# Patient Record
Sex: Male | Born: 1960
Health system: Southern US, Community
[De-identification: ages and names within clinical notes are randomized; demographics above are authoritative.]

## PROBLEM LIST (undated history)

## (undated) DIAGNOSIS — E7800 Pure hypercholesterolemia, unspecified: Secondary | ICD-10-CM

## (undated) DIAGNOSIS — E119 Type 2 diabetes mellitus without complications: Secondary | ICD-10-CM

## (undated) DIAGNOSIS — I639 Cerebral infarction, unspecified: Secondary | ICD-10-CM

## (undated) DIAGNOSIS — I509 Heart failure, unspecified: Secondary | ICD-10-CM

## (undated) DIAGNOSIS — R011 Cardiac murmur, unspecified: Secondary | ICD-10-CM

## (undated) DIAGNOSIS — R0989 Other specified symptoms and signs involving the circulatory and respiratory systems: Secondary | ICD-10-CM

## (undated) DIAGNOSIS — F1920 Other psychoactive substance dependence, uncomplicated: Secondary | ICD-10-CM

## (undated) DIAGNOSIS — Z95810 Presence of automatic (implantable) cardiac defibrillator: Secondary | ICD-10-CM

## (undated) DIAGNOSIS — J189 Pneumonia, unspecified organism: Secondary | ICD-10-CM

## (undated) DIAGNOSIS — G4730 Sleep apnea, unspecified: Secondary | ICD-10-CM

## (undated) DIAGNOSIS — I63411 Cerebral infarction due to embolism of right middle cerebral artery: Secondary | ICD-10-CM

## (undated) DIAGNOSIS — I5022 Chronic systolic (congestive) heart failure: Secondary | ICD-10-CM

## (undated) DIAGNOSIS — R931 Abnormal findings on diagnostic imaging of heart and coronary circulation: Secondary | ICD-10-CM

## (undated) DIAGNOSIS — I255 Ischemic cardiomyopathy: Secondary | ICD-10-CM

## (undated) DIAGNOSIS — Z95828 Presence of other vascular implants and grafts: Secondary | ICD-10-CM

## (undated) DIAGNOSIS — I428 Other cardiomyopathies: Secondary | ICD-10-CM

## (undated) HISTORY — PX: TONSILLECTOMY AND ADENOIDECTOMY: SUR1326

## (undated) HISTORY — PX: FRACTURE SURGERY: SHX138

## (undated) NOTE — *Deleted (*Deleted)
Physician Discharge Summary  Dennis [name]. ZOX:[id number] DOB: 08-15-1961 DOA: 07/07/2020  PCP: Harlon Ditty, MD  Admit date: 07/07/2020 Discharge date: 07/23/2020  Admitted From: *** Disposition:  ***  Recommendations for Outpatient Follow-up:  1. Follow up with PCP in 1-2 weeks 2. Please obtain BMP/CBC in one week 3. Please follow up on the following pending results:  Home Health:***  Equipment/Devices: ***  Discharge Condition: Stable Code Status:   Code Status: Full Code Diet recommendation:  Diet Order            Diet Carb Modified Fluid consistency: Thin; Room service appropriate? 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 CT head without contrast without acute finding. Neurology consulted. Could not do MRI due to ICD. Repeat CT head with contrast did not reveal CVA but concern about infiltrating enhancement in ventral pons and proximal medulla concerning of metastatic disease. EEG without epileptiform or seizure activity.  Patient with significant orthostatic hypotension even after holding cardiac meds, starting midodrine, TED hose and abdominal binder. Initially there was some vasovagal element to his orthostasis butvasovagal issues seem to have resolved. Echo withEF of 25 to 30% (previously 15 to 20%), diffuse hypokinesis G2-DD.Cardiology following.  Radiation oncologyconsulted by oncology, and hoping to doMRI brainfor better info aboutinfiltrating enhancement in midbrain. However,patient has AICDwhich is "MRI conditional" and "not FDA approved". Cardiology willing toturn off AICDif radiology is willing to do MRI brain. Radiation oncologymay  order special CT with thin cuts . Based on location MRI has been recommended operatively with meeting by his radiation oncologist Dr Timoteo Expose. Patient underwent MRI of the brain after turning pacer off- "Newly seen abnormal enhancement affecting the ventral pons from the pontomedullary junction to the mid brain. This is presumed represent metastatic disease" Patient continues to have lightheadedness and near syncopal episode mental status changes cardiology.  cardiology has further evaluated and gave bolus fluids.   Discharge Diagnoses:  Slurred speech/LUE paresthesia/unsteady gait in patient with prior right MCA CVA/acute metabolic encephalopathy on admission- CT head with and without contrast did not reveal CVA but possible pontine and medullary infiltrating lesion concerning for metastatic disease-underwent MRI of the brain after turning a pacemaker off 10/8 that shows abnormal enhancement affecting the ventral pons from the pontomedullary junction to the mid-brain-this is presumed to represent metastatic disease.  No acute stroke. Echo EF of 25 to 30% (improved). EEG no seizure activity, UDS positive for cocaine.HBA1c 10.5%. LDL 77.  Neurologically intact.Continue aspirin.  Recurrent syncope/orthostatic hypotension: Syncope due to severe orthostasis.  Multiple episodes, and micturition syncope and orthostasis.  He was symptomatic w/ bp in 70s on standing.TTE as above EF 25 to 30%.Cortisol low which is expected while on dexamethasone.TSH normal.   Seen by cardiology given bolus 500 ml ns and he will cont on midodrine TED hose and abdominal binder.    Small cell lung cancer with brain mets status post whole brain radiation:CT head with and without contrast concerning for pontine and medullary infiltrates so on Decadron 4 mg every 12 hoursper oncology .Discussed with Dr Barbaraann Cao and recommend MRI brain with and without contrast, and cardiology has turned off pacer  and had MRI 10/8- shows:"Newly seen  abnormal enhancement affecting the ventral pons from the pontomedullary junction to the mid brain. This is presumed represent metastatic disease. The possibility of treatment effect is considered but felt unlikely. Resolution of the previously seen right frontal metastasis. Old right MCA territory infarction"Spoke w/ Dr Barbaraann Cao and likely tumor, he will let radio onc know for possible fractionated radiosurgery ( likely outpatient, tumor board meeting monday).  He will arrange outpatient follow-up upon discharge.  Seen by palliative care patient would like to discuss with family  Chronic systolic CHF status post AICD [id number]: Not on guideline directed heart failure therapy due to his hypotension and syncope.  Per cardiology. CAD history occluded LAD with collaterals/HLD/HTN: No chest pain, continue aspirin and statin and Coumadin.  On midodrine due to hypotension.  History of Rt MCA CVA/cardiomyopathy on Coumadin per cardiology.  If he continues to be symptomatic w/ syncope-despite maximal therapy will need to reassess coumadin safety-have discussed cardiology will need to follow-up with cardiology.     Polysubstance abuse/Cocaine abuse: Cessation advised.He is not forthcoming about his drug use.  Uncontrolled type 2 diabetes mellitus with hyperglycemia, with long-term current use of insulin. hba1c 10.5,Cont on Lantus of 28 units twice daily, Premeal insulin 8 U -which was held 10/9 due to hypoglycemia sugar 62.  Increase Lantus to 25 units twice daily and Premeal insulin to 6 units.  Blood sugar has stabilized since.  Monitor before every meal at bedtime given that he is on a steroid. Recent Labs  Lab 07/22/20 0747 07/22/20 1142 07/22/20 1707 07/22/20 2047 07/23/20 0741  GLUCAP 160* 191* 208* 195* 166*   Lethargy/possible delirium-improved and is oriented.  Leukocytosis likely from steroid,is up today,but no fever.  Debility/deconditioning/failure to thrive: He will benefit with  palliative care consultation.  Palliative care has been consulted and met with the patient.  Home health is being set up.  Anemia of chronic disease:S hb stable Recent Labs  Lab 07/18/20 0435 07/20/20 0422 07/21/20 0550 07/22/20 0555 07/23/20 0513  HGB 10.4* 10.7* 10.4* 10.1* 10.4*  HCT 33.0* 34.7* 33.2* 31.8* 33.4*    Consults:  Cardiology, palliative care,radiation oncology,.  Subjective: ***  Discharge Exam: Vitals:   07/22/20 2051 07/23/20 0439  BP: 131/89 130/90  Pulse: 95 89  Resp: 18 18  Temp: (!) 97.5 F (36.4 C) 97.8 F (36.6 C)  SpO2: 100% 100%   General: Pt is alert, awake, not in acute distress Cardiovascular: RRR, S1/S2 +, no rubs, no gallops Respiratory: CTA bilaterally, no wheezing, no rhonchi Abdominal: Soft, NT, ND, bowel sounds + Extremities: no edema, no cyanosis  Discharge Instructions   Allergies as of 07/23/2020   No Known Allergies   Med Rec must be completed prior to using this SMARTLINK***       Follow-up Information    [facility] In 1 week.   Contact information: [address]     [address] [place] [address] [phone]             No Known Allergies  The results of significant diagnostics from this hospitalization (including imaging, microbiology, ancillary and laboratory) are listed below for reference.    Microbiology: No results found for this or any previous visit (from the past 240 hour(s)).  Procedures/Studies: CT HEAD WO CONTRAST  Result Date: 07/07/2020 CLINICAL DATA:  Slurred speech and dizziness.  History of CVA. EXAM: CT HEAD WITHOUT CONTRAST TECHNIQUE: Contiguous axial images were obtained from the base of the skull  through the vertex without intravenous contrast. COMPARISON:  June 07, 2020 FINDINGS: Brain: No evidence of acute infarction, hemorrhage, hydrocephalus, extra-axial collection or mass lesion/mass effect. Stable large area of hypoattenuation in the  subcortical right frontal lobe and insula, with associated mild ex vacuo dilatation of the right lateral ventricle, sequela of prior right MCA infarct. Vascular: No hyperdense vessel or unexpected calcification. Skull: Normal. Negative for fracture or focal lesion. Sinuses/Orbits: No acute finding. Other: None. IMPRESSION: 1. No acute intracranial abnormality. 2. Stable large area of hypoattenuation in the subcortical right frontal lobe and insula, with associated mild ex vacuo dilatation of the right lateral ventricle, sequela of prior right MCA infarct. Electronically Signed   By: Ted Mcalpine M.D.   On: 07/07/2020 15:17   CT HEAD W CONTRAST  Result Date: 07/07/2020 CLINICAL DATA:  Neuro deficit EXAM: CT HEAD WITH CONTRAST TECHNIQUE: Contiguous axial images were obtained from the base of the skull through the vertex with intravenous contrast. CONTRAST:  OMNIPAQUE IOHEXOL 300 MG/ML  SOLN COMPARISON:  07/07/2020 head CT.  06/06/2020 head CT and prior. FINDINGS: Brain: Sequela of prior right frontal insult. No acute infarct. No intracranial hemorrhage. No abnormal enhancement or mass lesion. Mild cerebral atrophy with ex vacuo dilatation. No midline shift, ventriculomegaly or extra-axial fluid collection. Vascular: Normal intravascular enhancement. Skull: No acute finding. Sinuses/Orbits: No acute finding. Mild ethmoid sinus mucosal thickening. Other: None. IMPRESSION: No acute intracranial process.  No abnormal enhancement. Sequela of prior right frontal insult, unchanged. Electronically Signed   By: Stana Bunting M.D.   On: 07/07/2020 19:43   CT HEAD W & WO CONTRAST  Result Date: 07/10/2020 CLINICAL DATA:  Small cell lung cancer. History of treated metastasis right frontal lobe. EXAM: CT HEAD WITHOUT AND WITH CONTRAST TECHNIQUE: Contiguous axial images were obtained from the base of the skull through the vertex without and with intravenous contrast CONTRAST:  75mL OMNIPAQUE IOHEXOL 300  MG/ML  SOLN COMPARISON:  CT head without with contrast 07/07/2020. CT head with contrast 12/23/2019 FINDINGS: Brain: Right frontal metastasis seen on the prior study of 12/23/2019 has resolved. No residual enhancing lesion in the right frontal lobe. There is hypodensity and volume loss in the right insula and frontal lobe compatible with chronic infarct which is unchanged. There is patchy increased density in the ventral pons and proximal medulla postcontrast which appears to be actual enhancement rather than artifact. This is not seen on the precontrast study. Of 07/07/2020 this is similar to the recent CT but not present on 12/23/2019. This area is obscured by streak artifact however is concerning for metastatic disease. No other enhancing lesions identified. Negative for intracranial hemorrhage. No hydrocephalus or midline shift. No acute infarct. Vascular: Negative for hyperdense vessel Skull: No skeletal lesion identified. Sinuses/Orbits: Paranasal sinuses clear.  Negative orbit Other: None IMPRESSION: Treated lesion in the right frontal lobe no longer present without residual enhancement. There appears to be infiltrating enhancement in the ventral pons and proximal medulla concerning for metastatic disease. This is a relatively extensive area of enhancement. Review of the recent CT 3 days ago has a similar appearance. This would be better evaluated by MRI but apparently the patient is not able to have an MRI. Patient has a pacemaker. Correlate with pacemaker manufacture and model for MRI compatibility. Chronic right frontal infarct.  No intracranial hemorrhage. These results were called by telephone at the time of interpretation on 07/10/2020 at 4:00 pm to provider [facility] , who verbally acknowledged these results. Electronically Signed  By: Marlan Palau M.D.   On: 07/10/2020 16:01   MR BRAIN W WO CONTRAST  Result Date: 07/20/2020 CLINICAL DATA:  Small cell lung cancer. Brain metastases. Reassess. EXAM:  MRI HEAD WITHOUT AND WITH CONTRAST TECHNIQUE: Multiplanar, multiecho pulse sequences of the brain and surrounding structures were obtained without and with intravenous contrast. CONTRAST:  6.71mL GADAVIST GADOBUTROL 1 MMOL/ML IV SOLN COMPARISON:  Head CT 07/10/2020. Multiple other CT studies as distant as April 2016. FINDINGS: Brain: Diffusion imaging does not show any acute or subacute infarction. There is old infarction of the inferior division right MCA on the right affecting the insula and frontal operculum. This has progressed to atrophy and encephalomalacia. Elsewhere, cerebral hemispheres do not show any ischemic insults. Old small vessel infarction left cerebellum no hydrocephalus or extra-axial collection. Resolution of the previously seen right frontal metastasis. No evidence of residual disease in that location. Newly seen abnormal enhancement affecting the ventral surface the pons the level of the pontomedullary junction to the mid brain. This is an unusual appearance and is presumed to be subsequent to metastatic disease. Some potential this could relate to treatment effect higher doses were employed in this region, but I do not know why that would have been the case. Vascular: Major vessels at the base of the brain show flow. Skull and upper cervical spine: Negative Sinuses/Orbits: Clear/normal Other: None IMPRESSION: Newly seen abnormal enhancement affecting the ventral pons from the pontomedullary junction to the mid brain. This is presumed represent metastatic disease. The possibility of treatment effect is considered but felt unlikely. Resolution of the previously seen right frontal metastasis. Old right MCA territory infarction. Electronically Signed   By: Paulina Fusi M.D.   On: 07/20/2020 13:11   DG Chest Port 1 View  Result Date: 07/07/2020 CLINICAL DATA:  Chills and weakness.  History of lung cancer. EXAM: PORTABLE CHEST 1 VIEW COMPARISON:  Chest radiograph 10/07/2019. FINDINGS: Stable  multi lead pacer apparatus overlying the left hemithorax. Stable cardiac and mediastinal contours. No large area pulmonary consolidation. No pleural effusion or pneumothorax. Small right middle lobe spiculated nodule not well demonstrated on current exam. IMPRESSION: No acute cardiopulmonary process. Electronically Signed   By: Annia Belt M.D.   On: 07/07/2020 13:54   EEG adult  Result Date: 07/09/2020 Charlsie Quest, MD     07/09/2020  3:39 PM Patient Name: Dennis [name]. MRN: [id number] Epilepsy Attending: Charlsie Quest Referring Physician/Provider: Dr. Kathlen Mody Date: 07/09/2020 Duration: 23.22 mins Patient history: 56 year old male with previous right MCA stroke, metastatic small lung cancer with mets to brain status post whole brain radiation, ongoing cocaine abuse who had an episode of transient speech disturbance.  EEG to evaluate for seizures. Level of alertness: Awake, assleep AEDs during EEG study: None Technical aspects: This EEG study was done with scalp electrodes positioned according to the 10-20 International system of electrode placement. Electrical activity was acquired at a sampling rate of 500Hz  and reviewed with a high frequency filter of 70Hz  and a low frequency filter of 1Hz . EEG data were recorded continuously and digitally stored. Description: The posterior dominant rhythm consists of 9 Hz activity of moderate voltage (25-35 uV) seen predominantly in posterior head regions, symmetric and reactive to eye opening and eye closing.  Sleep was characterized by vertex waves, sleep spindles (12 to 14 Hz), maximal frontocentral region.  Hyperventilation and photic stimulation were not performed.   IMPRESSION: This study is within normal limits. No seizures or epileptiform discharges were seen  throughout the recording. Charlsie Quest   ECHOCARDIOGRAM COMPLETE  Result Date: 07/09/2020    ECHOCARDIOGRAM REPORT   Patient Name:   Dennis [name]. Date of Exam: 07/09/2020 Medical Rec #:   [id number]       Height:       72.0 in Accession #:    [id number]      Weight:       137.0 lb Date of Birth:  11-Jun-1961        BSA:          1.814 m Patient Age:    59 years        BP:           100/65 mmHg Patient Gender: M               HR:           94 bpm. Exam Location:  Inpatient Procedure: 2D Echo Indications:    TIA G45.9  History:        Patient has prior history of Echocardiogram examinations, most                 recent 12/25/2019. CHF and Ischemic Cardiomyopathy, CAD,                 Defibrillator; Risk Factors:Diabetes, Drug and Alcohol abuse and                 Dyslipidemia.  Sonographer:    Thurman Coyer RDCS (AE) Referring Phys: Oliver Hum VIJAYA AKULA IMPRESSIONS  1. Since the last exam on 12/25/2019 LVEF has slightly improved from 15-20% to 25-30% with diffuse hypokinesis.  2. Left ventricular ejection fraction, by estimation, is 25 to 30%. The left ventricle has severely decreased function. The left ventricle demonstrates global hypokinesis. The left ventricular internal cavity size was moderately dilated. There is mild concentric left ventricular hypertrophy. Left ventricular diastolic parameters are consistent with Grade II diastolic dysfunction (pseudonormalization). Elevated left atrial pressure.  3. Right ventricular systolic function is normal. The right ventricular size is normal. There is normal pulmonary artery systolic pressure.  4. A small pericardial effusion is present. The pericardial effusion is posterior to the left ventricle.  5. The mitral valve is normal in structure. Mild mitral valve regurgitation. No evidence of mitral stenosis.  6. The aortic valve is normal in structure. Aortic valve regurgitation is trivial. No aortic stenosis is present.  7. The inferior vena cava is normal in size with greater than 50% respiratory variability, suggesting right atrial pressure of 3 mmHg. FINDINGS  Left Ventricle: Left ventricular ejection fraction, by estimation, is 25 to 30%. The left  ventricle has severely decreased function. The left ventricle demonstrates global hypokinesis. The left ventricular internal cavity size was moderately dilated. There is mild concentric left ventricular hypertrophy. Left ventricular diastolic parameters are consistent with Grade II diastolic dysfunction (pseudonormalization). Elevated left atrial pressure. Right Ventricle: The right ventricular size is normal. No increase in right ventricular wall thickness. Right ventricular systolic function is normal. There is normal pulmonary artery systolic pressure. The tricuspid regurgitant velocity is 1.77 m/s, and  with an assumed right atrial pressure of 8 mmHg, the estimated right ventricular systolic pressure is 20.5 mmHg. Left Atrium: Left atrial size was normal in size. Right Atrium: Right atrial size was normal in size. Pericardium: A small pericardial effusion is present. The pericardial effusion is posterior to the left ventricle. Mitral Valve: The mitral valve is normal in structure. Mild mitral valve regurgitation. No  evidence of mitral valve stenosis. Tricuspid Valve: The tricuspid valve is normal in structure. Tricuspid valve regurgitation is mild . No evidence of tricuspid stenosis. Aortic Valve: The aortic valve is normal in structure. Aortic valve regurgitation is trivial. No aortic stenosis is present. Pulmonic Valve: The pulmonic valve was normal in structure. Pulmonic valve regurgitation is trivial. No evidence of pulmonic stenosis. Aorta: The aortic root is normal in size and structure. Venous: The inferior vena cava is normal in size with greater than 50% respiratory variability, suggesting right atrial pressure of 3 mmHg. IAS/Shunts: No atrial level shunt detected by color flow Doppler. Additional Comments: A pacer wire is visualized in the right atrium and right ventricle.  LEFT VENTRICLE PLAX 2D LVIDd:         6.40 cm  Diastology LVIDs:         5.40 cm  LV e' medial: 9.68 cm/s LV PW:         1.20 cm  LV IVS:        1.10 cm LVOT diam:     2.20 cm LV SV:         69 LV SV Index:   38 LVOT Area:     3.80 cm  RIGHT VENTRICLE RV S prime:     12.30 cm/s TAPSE (M-mode): 2.3 cm LEFT ATRIUM             Index       RIGHT ATRIUM           Index LA diam:        3.20 cm 1.76 cm/m  RA Area:     18.00 cm LA Vol (A2C):   57.9 ml 31.92 ml/m RA Volume:   46.30 ml  25.52 ml/m LA Vol (A4C):   53.6 ml 29.55 ml/m LA Biplane Vol: 61.1 ml 33.68 ml/m  AORTIC VALVE LVOT Vmax:   111.67 cm/s LVOT Vmean:  82.300 cm/s LVOT VTI:    0.180 m  AORTA Ao Root diam: 3.70 cm TRICUSPID VALVE TR Peak grad:   12.5 mmHg TR Vmax:        177.00 cm/s  SHUNTS Systemic VTI:  0.18 m Systemic Diam: 2.20 cm Tobias Alexander MD Electronically signed by Tobias Alexander MD Signature Date/Time: 07/09/2020/12:16:56 PM    Final    VAS US CAROTID (at [facility] and [facility] only)  Result Date: 07/09/2020 Carotid Arterial Duplex Study Indications:       CVA. Risk Factors:      Hypertension, hyperlipidemia. Comparison Study:  No prior studies. Performing Technologist: Chanda Busing RVT  Examination Guidelines: A complete evaluation includes B-mode imaging, spectral Doppler, color Doppler, and power Doppler as needed of all accessible portions of each vessel. Bilateral testing is considered an integral part of a complete examination. Limited examinations for reoccurring indications may be performed as noted.  Right Carotid Findings: +----------+--------+--------+--------+-----------------------+--------+           PSV cm/sEDV cm/sStenosisPlaque Description     Comments +----------+--------+--------+--------+-----------------------+--------+ CCA Prox  110     30              smooth and heterogenous         +----------+--------+--------+--------+-----------------------+--------+ CCA Distal71      26              smooth and heterogenous         +----------+--------+--------+--------+-----------------------+--------+ ICA Prox  49      20  smooth and heterogenous         +----------+--------+--------+--------+-----------------------+--------+ ICA Distal63      27                                     tortuous +----------+--------+--------+--------+-----------------------+--------+ ECA       82      21                                              +----------+--------+--------+--------+-----------------------+--------+ +----------+--------+-------+--------+-------------------+           PSV cm/sEDV cmsDescribeArm Pressure (mmHG) +----------+--------+-------+--------+-------------------+ [id number]                                         +----------+--------+-------+--------+-------------------+ +---------+--------+--+--------+--+---------+ VertebralPSV cm/s57EDV cm/s28Antegrade +---------+--------+--+--------+--+---------+  Left Carotid Findings: +----------+--------+--------+--------+-----------------------+--------+           PSV cm/sEDV cm/sStenosisPlaque Description     Comments +----------+--------+--------+--------+-----------------------+--------+ CCA Prox  98      30              smooth and heterogenous         +----------+--------+--------+--------+-----------------------+--------+ CCA Distal56      18              smooth and heterogenous         +----------+--------+--------+--------+-----------------------+--------+ ICA Prox  48      19              smooth and heterogenous         +----------+--------+--------+--------+-----------------------+--------+ ICA Distal56      26                                     tortuous +----------+--------+--------+--------+-----------------------+--------+ ECA       40      6                                               +----------+--------+--------+--------+-----------------------+--------+ +----------+--------+--------+--------+-------------------+           PSV cm/sEDV cm/sDescribeArm Pressure (mmHG)  +----------+--------+--------+--------+-------------------+ [id number]                                          +----------+--------+--------+--------+-------------------+ +---------+--------+--+--------+--+---------+ VertebralPSV cm/s43EDV cm/s20Antegrade +---------+--------+--+--------+--+---------+   Summary: Right Carotid: Velocities in the right ICA are consistent with a 1-39% stenosis. Left Carotid: Velocities in the left ICA are consistent with a 1-39% stenosis. Vertebrals: Bilateral vertebral arteries demonstrate antegrade flow. *See table(s) above for measurements and observations.  Electronically signed by Delia Heady MD on 07/09/2020 at 12:52:15 PM.    Final      Labs: BNP (last 3 results) Recent Labs    08/05/19 0954  BNP 171.2*   Basic Metabolic Panel: Recent Labs  Lab 07/18/20 0435 07/20/20 0422 07/21/20 0550 07/22/20 0555 07/23/20 0513  NA 137 136 136 135 136  K 4.4 4.3 4.4 4.5 4.5  CL 102 100 99  100 99  CO2 27 28 28 27 29   GLUCOSE 149* 158* 158* 151* 164*  BUN 34* 40* 42* 38* 39*  CREATININE 0.96 0.97 0.94 0.91 0.91  CALCIUM 9.5 9.6 9.8 9.4 9.5   Liver Function Tests: No results for input(s): AST, ALT, ALKPHOS, BILITOT, PROT, ALBUMIN in the last 168 hours. No results for input(s): LIPASE, AMYLASE in the last 168 hours. No results for input(s): AMMONIA in the last 168 hours. CBC: Recent Labs  Lab 07/18/20 0435 07/20/20 0422 07/21/20 0550 07/22/20 0555 07/23/20 0513  WBC 15.0* 14.4* 16.8* 15.4* 15.6*  HGB 10.4* 10.7* 10.4* 10.1* 10.4*  HCT 33.0* 34.7* 33.2* 31.8* 33.4*  MCV 95.7 96.7 96.5 96.7 96.8  PLT 387 318 325 309 285   Cardiac Enzymes: No results for input(s): CKTOTAL, CKMB, CKMBINDEX, TROPONINI in the last 168 hours. BNP: Invalid input(s): POCBNP CBG: Recent Labs  Lab 07/22/20 0747 07/22/20 1142 07/22/20 1707 07/22/20 2047 07/23/20 0741  GLUCAP 160* 191* 208* 195* 166*   D-Dimer No results for input(s): DDIMER in the  last 72 hours. Hgb A1c No results for input(s): HGBA1C in the last 72 hours. Lipid Profile No results for input(s): CHOL, HDL, LDLCALC, TRIG, CHOLHDL, LDLDIRECT in the last 72 hours. Thyroid function studies No results for input(s): TSH, T4TOTAL, T3FREE, THYROIDAB in the last 72 hours.  Invalid input(s): FREET3 Anemia work up No results for input(s): VITAMINB12, FOLATE, FERRITIN, TIBC, IRON, RETICCTPCT in the last 72 hours. Urinalysis    Component Value Date/Time   COLORURINE YELLOW 07/07/2020 1628   APPEARANCEUR CLEAR 07/07/2020 1628   LABSPEC 1.014 07/07/2020 1628   PHURINE 5.0 07/07/2020 1628   GLUCOSEU 50 (A) 07/07/2020 1628   HGBUR NEGATIVE 07/07/2020 1628   BILIRUBINUR NEGATIVE 07/07/2020 1628   KETONESUR NEGATIVE 07/07/2020 1628   PROTEINUR 100 (A) 07/07/2020 1628   UROBILINOGEN 1.0 07/16/2015 1715   NITRITE NEGATIVE 07/07/2020 1628   LEUKOCYTESUR NEGATIVE 07/07/2020 1628   Sepsis Labs Invalid input(s): PROCALCITONIN,  WBC,  LACTICIDVEN Microbiology No results found for this or any previous visit (from the past 240 hour(s)).   Time coordinating discharge: *** minutes  SIGNED: Lanae Boast, MD  [facility] 07/23/2020, 8:15 AM  If 7PM-7AM, please contact night-coverage [email or web address]

---
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
## 1986-10-13 DIAGNOSIS — J189 Pneumonia, unspecified organism: Secondary | ICD-10-CM

## 1986-10-13 HISTORY — DX: Pneumonia, unspecified organism: J18.9

## 1992-10-13 HISTORY — PX: CHOLECYSTECTOMY: SHX55

## 1998-04-06 ENCOUNTER — Ambulatory Visit (HOSPITAL_COMMUNITY): Admission: RE | Admit: 1998-04-06 | Discharge: 1998-04-06 | Payer: Self-pay | Admitting: Family Medicine

## 1998-04-09 ENCOUNTER — Ambulatory Visit (HOSPITAL_COMMUNITY): Admission: RE | Admit: 1998-04-09 | Discharge: 1998-04-09 | Payer: Self-pay | Admitting: Family Medicine

## 1998-06-22 ENCOUNTER — Inpatient Hospital Stay (HOSPITAL_COMMUNITY): Admission: EM | Admit: 1998-06-22 | Discharge: 1998-06-23 | Payer: Self-pay | Admitting: Emergency Medicine

## 1998-11-10 ENCOUNTER — Emergency Department (HOSPITAL_COMMUNITY): Admission: EM | Admit: 1998-11-10 | Discharge: 1998-11-10 | Payer: Self-pay | Admitting: Emergency Medicine

## 1999-04-15 ENCOUNTER — Emergency Department (HOSPITAL_COMMUNITY): Admission: EM | Admit: 1999-04-15 | Discharge: 1999-04-15 | Payer: Self-pay | Admitting: Emergency Medicine

## 1999-08-15 ENCOUNTER — Emergency Department (HOSPITAL_COMMUNITY): Admission: EM | Admit: 1999-08-15 | Discharge: 1999-08-15 | Payer: Self-pay | Admitting: Emergency Medicine

## 1999-08-15 ENCOUNTER — Encounter: Payer: Self-pay | Admitting: Emergency Medicine

## 1999-10-22 ENCOUNTER — Emergency Department (HOSPITAL_COMMUNITY): Admission: EM | Admit: 1999-10-22 | Discharge: 1999-10-22 | Payer: Self-pay | Admitting: Emergency Medicine

## 1999-10-22 ENCOUNTER — Encounter: Payer: Self-pay | Admitting: Emergency Medicine

## 2000-10-08 ENCOUNTER — Emergency Department (HOSPITAL_COMMUNITY): Admission: EM | Admit: 2000-10-08 | Discharge: 2000-10-08 | Payer: Self-pay | Admitting: Emergency Medicine

## 2001-08-31 ENCOUNTER — Emergency Department (HOSPITAL_COMMUNITY): Admission: EM | Admit: 2001-08-31 | Discharge: 2001-08-31 | Payer: Self-pay | Admitting: Emergency Medicine

## 2001-08-31 ENCOUNTER — Encounter: Payer: Self-pay | Admitting: Emergency Medicine

## 2004-09-29 ENCOUNTER — Emergency Department (HOSPITAL_COMMUNITY): Admission: EM | Admit: 2004-09-29 | Discharge: 2004-09-30 | Payer: Self-pay | Admitting: Emergency Medicine

## 2006-09-15 ENCOUNTER — Emergency Department (HOSPITAL_COMMUNITY): Admission: EM | Admit: 2006-09-15 | Discharge: 2006-09-15 | Payer: Self-pay | Admitting: Emergency Medicine

## 2006-11-18 ENCOUNTER — Emergency Department (HOSPITAL_COMMUNITY): Admission: EM | Admit: 2006-11-18 | Discharge: 2006-11-18 | Payer: Self-pay | Admitting: Emergency Medicine

## 2007-07-02 ENCOUNTER — Emergency Department (HOSPITAL_COMMUNITY): Admission: EM | Admit: 2007-07-02 | Discharge: 2007-07-02 | Payer: Self-pay | Admitting: Emergency Medicine

## 2007-07-02 IMAGING — CR DG SACRUM/COCCYX 2+V
3 series · 3 of 3 positions shown · non-contrast
Comparison: none

CLINICAL DATA: Back pain in 46 year-old male. Pain left SI joint area.
 SACRUM/COCCYX ? 3 VIEW:

[t sacrum a.p.]
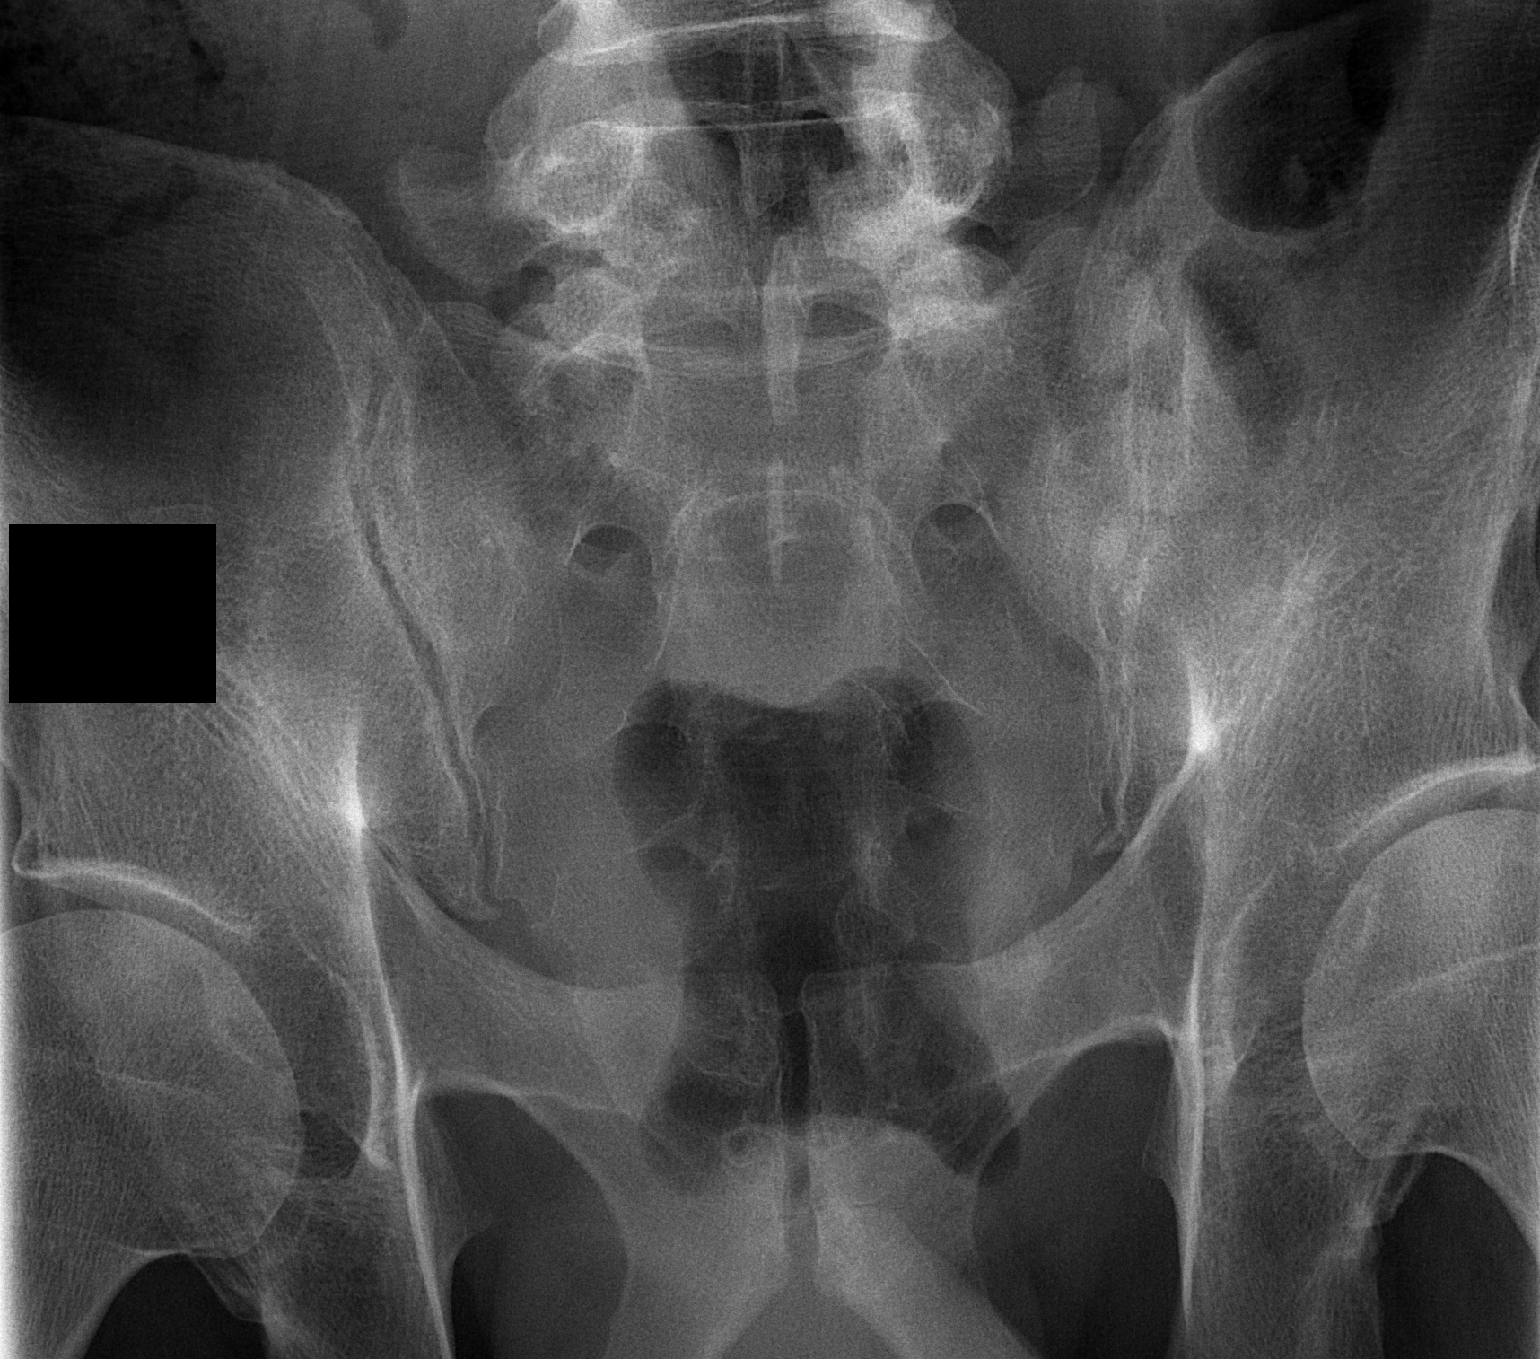

[t sacrum lat (1 of 2)]
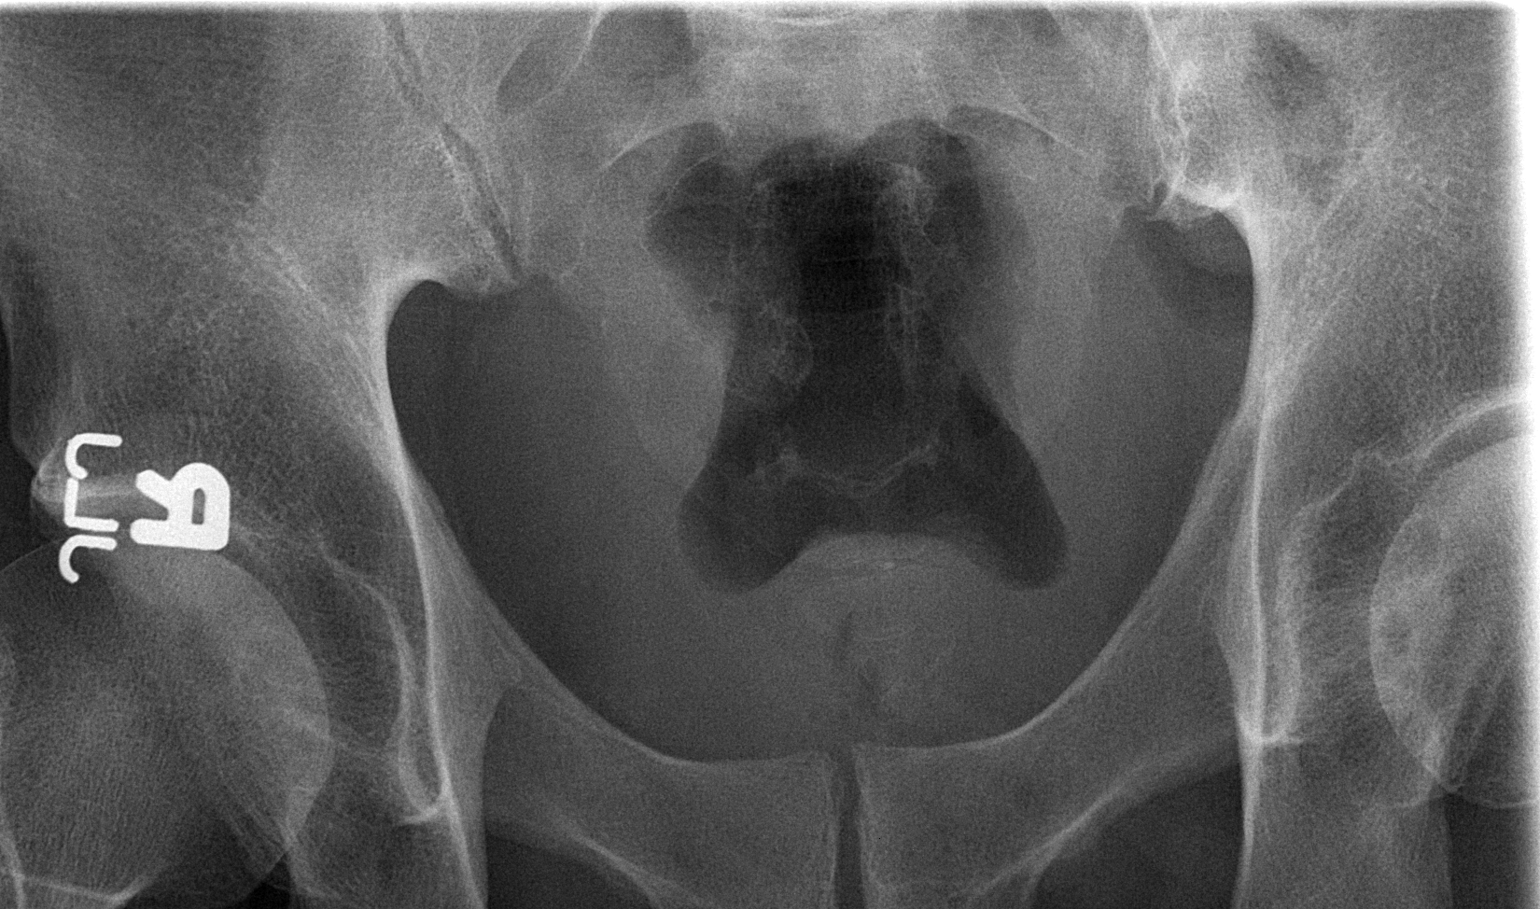

[t sacrum lat (2 of 2)]
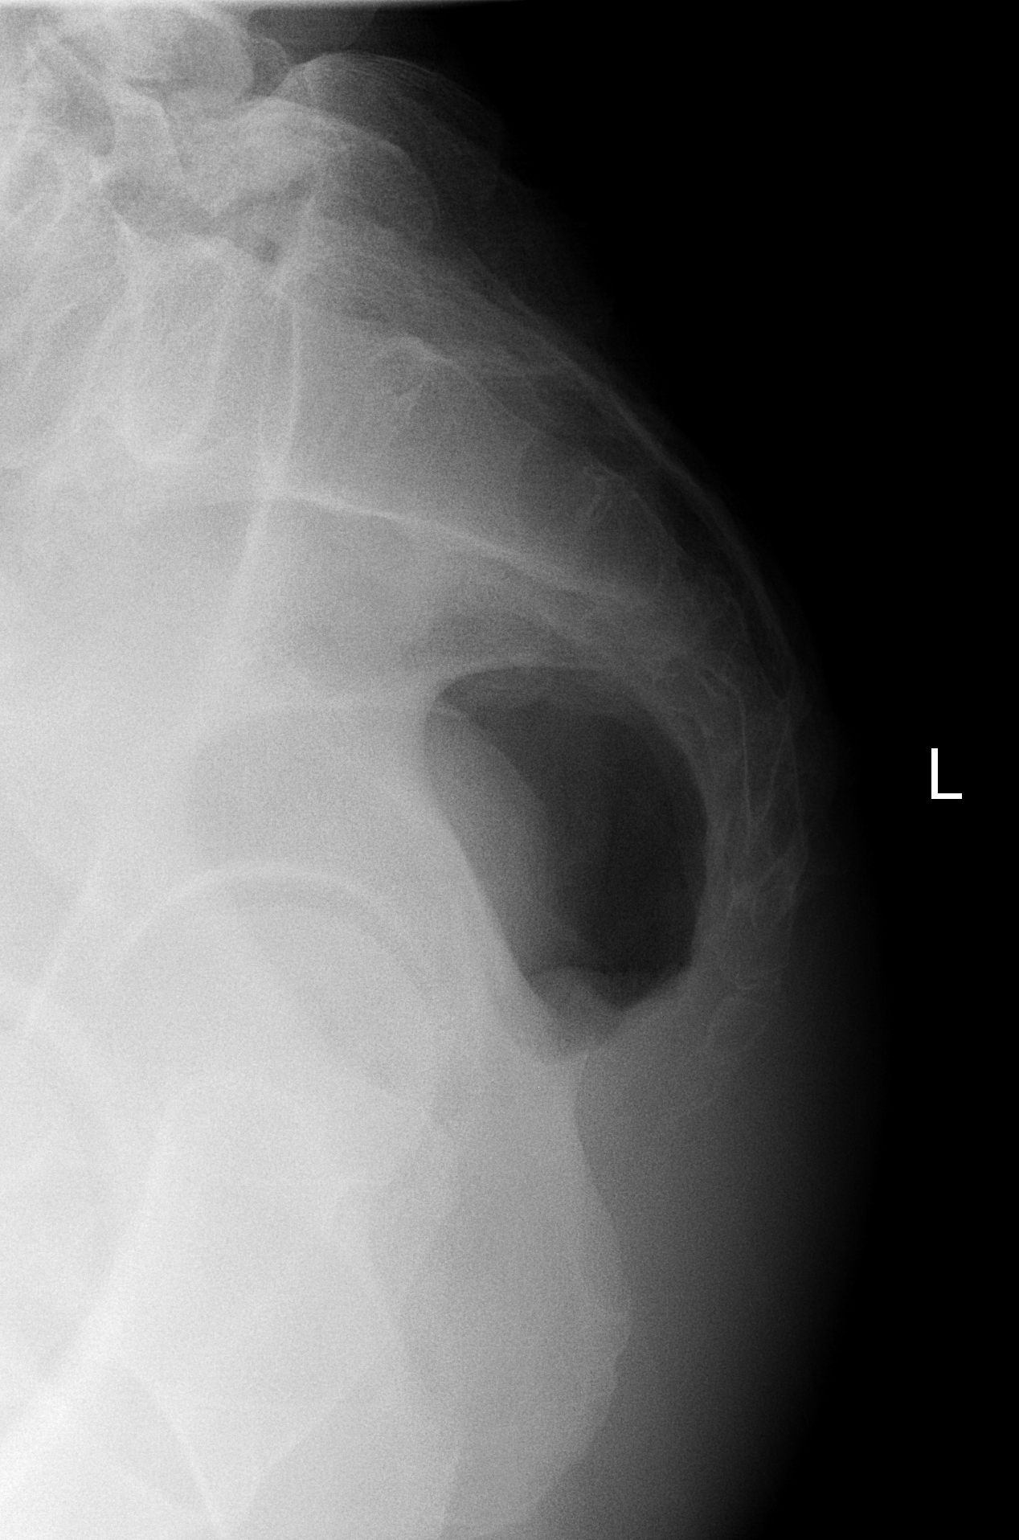

[3 of 3 positions shown; findings below may reference images not displayed]

FINDINGS: Three views of the sacrum and coccyx demonstrate no evidence of fracture or subluxation.
IMPRESSION: No evidence of fracture of the sacrum or coccyx.

## 2007-09-13 ENCOUNTER — Ambulatory Visit: Payer: Self-pay | Admitting: Internal Medicine

## 2007-09-13 DIAGNOSIS — R634 Abnormal weight loss: Secondary | ICD-10-CM | POA: Insufficient documentation

## 2007-09-13 DIAGNOSIS — E1139 Type 2 diabetes mellitus with other diabetic ophthalmic complication: Secondary | ICD-10-CM | POA: Insufficient documentation

## 2007-09-13 DIAGNOSIS — R0602 Shortness of breath: Secondary | ICD-10-CM | POA: Insufficient documentation

## 2007-09-13 IMAGING — CR DG CHEST 2V
2 series · 2 of 2 positions shown · non-contrast
Comparison: None.

CLINICAL DATA: Shortness of breath.
 CHEST - 2 VIEW:

[view not recorded (1 of 2)]
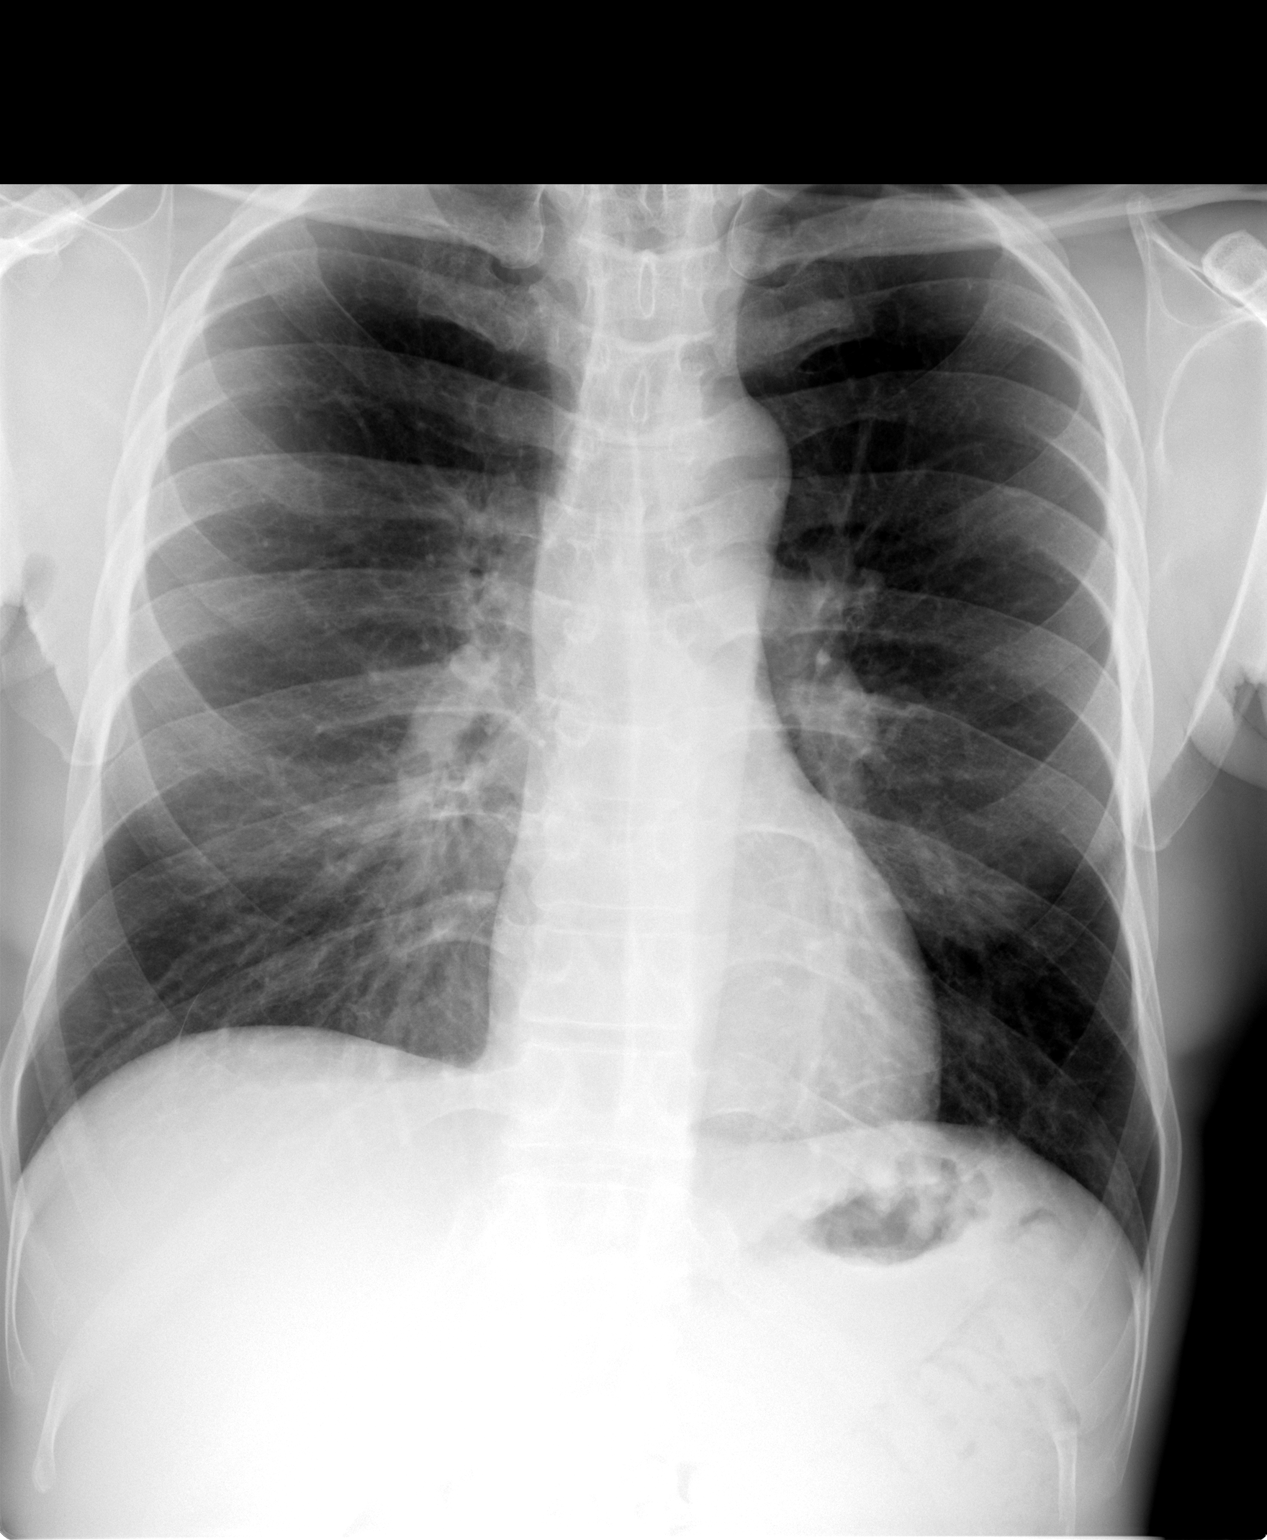

[view not recorded (2 of 2)]
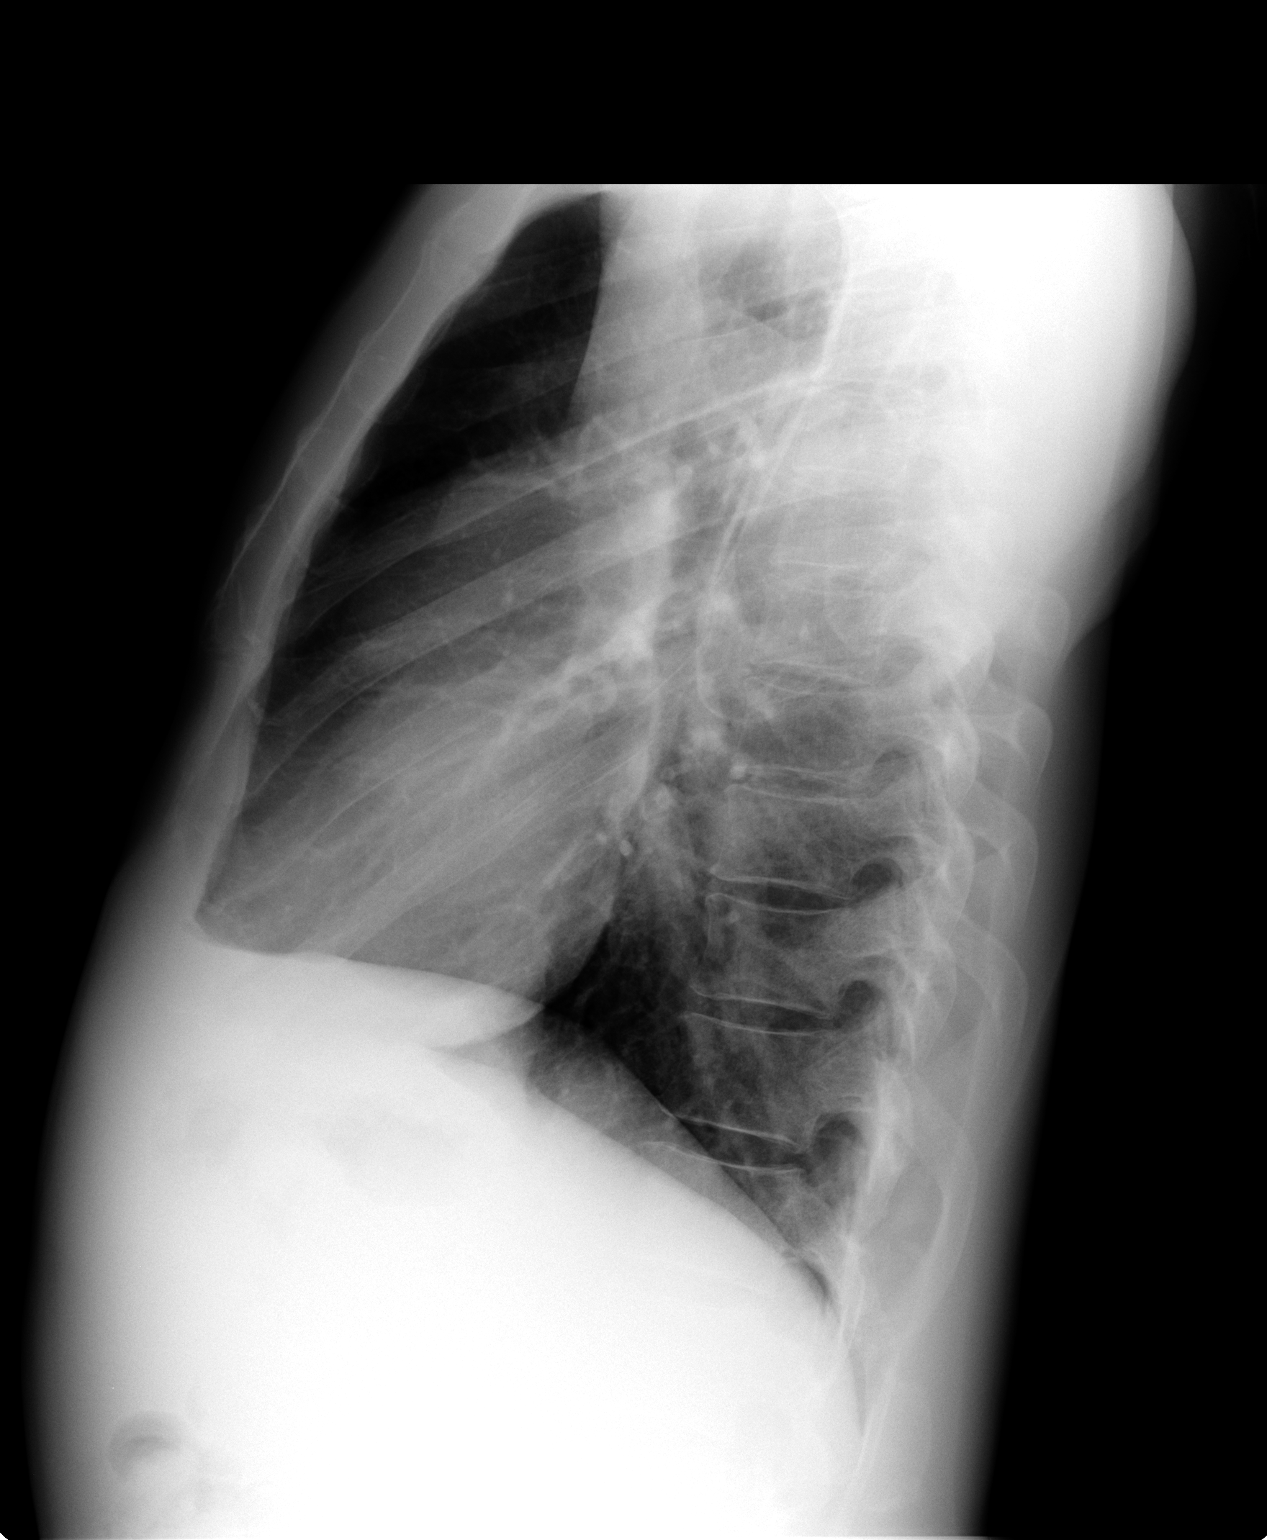

[2 of 2 positions shown; findings below may reference images not displayed]

FINDINGS: The heart size and mediastinal contours are within normal limits.  Both lungs are clear.  The visualized skeletal structures are unremarkable.
IMPRESSION: No active cardiopulmonary disease.

## 2007-10-12 ENCOUNTER — Encounter (INDEPENDENT_AMBULATORY_CARE_PROVIDER_SITE_OTHER): Payer: Self-pay | Admitting: *Deleted

## 2007-10-12 ENCOUNTER — Ambulatory Visit: Payer: Self-pay | Admitting: Internal Medicine

## 2007-10-12 LAB — CONVERTED CEMR LAB

## 2007-10-15 LAB — CONVERTED CEMR LAB
ALT: 14 units/L (ref 0–53)
AST: 13 units/L (ref 0–37)
Albumin: 3.8 g/dL (ref 3.5–5.2)
Alkaline Phosphatase: 96 units/L (ref 39–117)
BUN: 11 mg/dL (ref 6–23)
Bilirubin, Direct: 0.2 mg/dL (ref 0.0–0.3)
CO2: 31 meq/L (ref 19–32)
Calcium: 9.5 mg/dL (ref 8.4–10.5)
Chloride: 98 meq/L (ref 96–112)
Creatinine, Ser: 1.1 mg/dL (ref 0.4–1.5)
Creatinine,U: 38.9 mg/dL
Free T4: 1.2 ng/dL (ref 0.6–1.6)
GFR calc Af Amer: 93 mL/min
GFR calc non Af Amer: 77 mL/min
Glucose, Bld: 532 mg/dL (ref 70–99)
Hgb A1c MFr Bld: 9.2 % — ABNORMAL HIGH (ref 4.6–6.0)
Microalb Creat Ratio: 12.9 mg/g (ref 0.0–30.0)
Microalb, Ur: 0.5 mg/dL (ref 0.0–1.9)
Potassium: 4.4 meq/L (ref 3.5–5.1)
Sodium: 136 meq/L (ref 135–145)
T3, Free: 3.2 pg/mL (ref 2.3–4.2)
TSH: 1.11 microintl units/mL (ref 0.35–5.50)
Total Bilirubin: 0.7 mg/dL (ref 0.3–1.2)
Total Protein: 6.7 g/dL (ref 6.0–8.3)

## 2010-10-13 DIAGNOSIS — Z95810 Presence of automatic (implantable) cardiac defibrillator: Secondary | ICD-10-CM

## 2010-10-13 HISTORY — DX: Presence of automatic (implantable) cardiac defibrillator: Z95.810

## 2010-11-12 NOTE — Assessment & Plan Note (Signed)
Summary: NEW/[employer]/AWARE OF FEE/NWS   Vital Signs:  Patient Profile:   50 Years Old Male Height:     72 inches Weight:      164.13 pounds BMI:     22.34 Temp:     98.1 degrees F oral Pulse rate:   96 / minute BP sitting:   110 / 75  (right arm)  Vitals Entered By: Glendell Docker (September 13, 2007 9:36 AM)                 Chief Complaint:  New patient.  History of Present Illness: 50 year old African-American male here to establish primary care.  He has a past medical history of type 2 diabetes diagnosed in 2005.  He reports hospitalization for blood sugars 400 to 500s.  he was placed on metformin and glipizide.  He has not had regular follow-up.  He reports he does not check his sugar on a regularl t basis.  It has been over a year since his last funduscopic eye exam by ophthalmologist.  His main complaint today has been progressive weight loss.  He weighed 232 pounds in July of 2007.  He is at his current weight of 164.2 pounds.  He denies anorexia.  He eats 3 meals a day.  He denies history of palpitations or anxiety.  He has been monogamous for the last 4 years but does report having many sexual partners in the remote past.  He reports recent HIV testing that was negative.  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  The patient denies vision loss, chest pain, abdominal pain, hematochezia, and severe indigestion/heartburn.     Physical Exam  General:     alert, appropriate dress, normal appearance, and underweight appearing.   Head:     Normocephalic and atraumatic without obvious abnormalities.  Eyes:     No corneal or conjunctival inflammation noted. EOMI. Perrla. Funduscopic exam benign, without hemorrhages, exudates or papilledema. Vision grossly normal. Ears:     External ear exam shows no significant lesions or deformities.  Otoscopic examination reveals clear canals, tympanic membranes are intact bilaterally without bulging, retraction, inflammation or discharge. Hearing is grossly normal bilaterally. Nose:     External nasal examination shows no deformity or inflammation. Nasal mucosa are pink and moist without lesions or exudates. Mouth:     Oral mucosa and oropharynx without lesions or exudates.   Neck:     No deformities, masses, or tenderness noted.no carotid bruits.   Lungs:     Normal respiratory effort, chest expands symmetrically.  Lungs are clear to auscultation, no crackles or wheezes. Heart:     Normal rate and regular rhythm. S1 and S2 normal without gallop, murmur, click, rub or other extra sounds. Abdomen:     Bowel sounds positive,abdomen soft and non-tender without masses, organomegaly or hernias noted. Pulses:     femoral,dorsalis pedis and posterior tibial pulses are full and equal bilaterally Extremities:     No lower extremity edema  Neurologic:     No cranial nerve deficits noted. Station and gait are normal. Sensory, motor and coordinative functions appear intact. Psych:     Cognition and judgment appear intact. Alert and cooperative with normal attention span and concentration.    Impression & Recommendations:  Problem # 1:  WEIGHT LOSS, ABNORMAL  (ICD-783.21) Patient is approximately 70 pounds within the last 18 months.  Patient has changed his diet when he learned he was diabetic Some of this weight loss may be unintentional. Check thyroid studies. Patient reports negative HIV recently.  Problem # 2:  SOB (ICD-786.05) Patient likely has some component of chronic obstructive lung disease considering 20-pack-year history..  Check chest x-ray.  May need 2-D echocardiogram. Orders: T-2 View CXR (71020TC)   Problem # 3:  TOBACCO ABUSE (ICD-305.1) Encouraged smoking cessation and discussed different methods for smoking cessation.   Problem # 4:  DIABETES MELLITUS, TYPE II (ICD-250.00) Obtain A1c and micro-albumin/cr ratio ratio.  We discussed need for ophthalmology referral.  His updated medication list for this problem includes:    Metformin Hcl 500 Mg Tabs (Metformin hcl) .Marland Kitchen... Take 1 tablet by mouth once a day    Glipizide 10 Mg Tabs (Glipizide) .Marland Kitchen... Take 1 tablet by mouth once a day   Complete Medication List: 1)  Metformin Hcl 500 Mg Tabs (Metformin hcl) .... Take 1 tablet by mouth once a day 2)  Glipizide 10 Mg Tabs (Glipizide) .... Take 1 tablet by mouth once a day  Other Orders: Pneumococcal Vaccine (75643) Influenza Vaccine NON MCR (32951) Admin 1st Vaccine (88416)   Patient Instructions: 1)  Please schedule a follow-up appointment in 2 weeks. 2)  BMP prior to visit, ICD-9:  250.00 3)  Hepatic Panel prior to visit, ICD-9:  250.00 4)  Lipid Panel prior to visit, ICD-9: 250.00 5)  TSH prior to visit, ICD-9: 783.21 6)  Free T4 and Free T3:  783.21 7)  CBC w/ Diff prior to visit, ICD-9:  783.21 8)  Urine-dip prior to visit, ICD-9: 250.00 9)  PSA prior to visit, ICD-9: V76.44 10)  HbgA1C prior to visit, ICD-9: 250.00 11)  Urine Microalbumin prior to visit, ICD-9: 250.00    ]  Pneumovax Vaccine    Vaccine Type: Pneumovax    Site: right deltoid    Mfr: Merck    Dose: 0.5 ml    Route: IM    Given by:  Glendell Docker    Exp. Date: 03/05/2009    Lot #: [id number]    VIS given: 05/10/96 version given September 13, 2007.  Influenza Vaccine    Vaccine Type: Fluvax Non-MCR    Site: left deltoid    Mfr: Sanofi Pasteur    Dose: 0.5 ml    Route: IM    Given by: Glendell Docker    Exp. Date: 04/11/2008    Lot #: [id number]    VIS given: 05/06/07 version given September 13, 2007.  Flu Vaccine Consent Questions    Do you have a history of severe allergic reactions to this vaccine?  no    Any prior history of allergic reactions to egg and/or gelatin? no    Do you have a sensitivity to the preservative Thimersol? no    Do you have a past history of Guillan-Barre Syndrome? no    Do you currently have an acute febrile illness? no    Have you ever had a severe reaction to latex? no    Vaccine information given and explained to patient? yes

## 2010-11-12 NOTE — Letter (Signed)
Summary: [facility] Consult Scheduled Letter  [facility]  [address] [place], [place] [address]   Phone: [phone]  Fax: [phone]    10/12/2007 MRN: [id number]    Dear Dennis [name],      We have scheduled an appointment for you.  At the recommendation of Dr. Artist Pais, we have scheduled you a consult with Dr. Ashley Royalty on October 25, 2007 at 1:40pm.  Their phone number is [phone].  If this appointment day and time is not convenient for you, please feel free to call the office of the doctor you are being referred to at the number listed above and reschedule the appointment.  Dr. Ashley Royalty [facility] [address] [place], [place] [address]  Thank you,  Patient Care Coordinator [facility]

## 2010-11-12 NOTE — Assessment & Plan Note (Signed)
Summary: 2 WK ROA/NML   Vital Signs:  Patient Profile:   50 Years Old Male Height:     72 inches Weight:      164.13 pounds BMI:     22.34 Temp:     98.2 degrees F oral Pulse rate:   93 / minute BP sitting:   127 / 78  (right arm)  Vitals Entered By: Glendell Docker (October 12, 2007 10:35 AM)                 Chief Complaint:  Diabetes Management and Type 2 diabetes mellitus follow-up.  History of Present Illness:  Type 2 Diabetes Mellitus Follow-Up      This is a 50 year old man who presents for Type 2 diabetes mellitus follow-up.  The patient reports weight loss, but denies self managed hypoglycemia.  The patient denies the following symptoms: chest pain.  Since the last visit the patient reports poor dietary compliance.  The patient has been measuring capillary blood glucose before breakfast.  Since the last visit, the patient reports having had no eye care.    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   Neck:     No deformities, masses, or tenderness noted. Lungs:     Normal respiratory effort, chest expands symmetrically. Lungs are clear to auscultation, no crackles or wheezes. Heart:     Normal rate and regular rhythm. S1 and S2 normal without gallop,  murmur, click, rub or other extra sounds. Abdomen:     soft and non-tender.   Extremities:     No lower extremity edema  Skin:     1 cm firm hyperpigmented nodule of left lower thigh, slightly tender to palpation Psych:     Cognition and judgment appear intact. Alert and cooperative with normal attention span and concentration. No apparent delusions, illusions, hallucinations    Impression & Recommendations:  Problem # 1:  DIABETES MELLITUS, TYPE II (ICD-250.00) His AM CBG is 300-400.  Poor dietary compliance and insight. Refer to diabetic educator.  He will likely need insulin therapy.   He is resistant to starting insulin.  His updated medication list for this problem includes:    Metformin Hcl 500 Mg Tabs (Metformin hcl) .Marland Kitchen... Take 1 tablet by mouth once a day    Glipizide 10 Mg Tabs (Glipizide) .Marland Kitchen... Take 1 tablet by mouth once a day  Orders: TLB-A1C / Hgb A1C (Glycohemoglobin) (83036-A1C) TLB-BMP (Basic Metabolic  Panel-BMET) (80048-METABOL) TLB-Hepatic/Liver Function Pnl (80076-HEPATIC) TLB-Microalbumin/Creat Ratio, Urine (82043-MALB) Diabetic Clinic Referral (Diabetic) Ophthalmology Referral (Ophthalmology)   Problem # 2:  WEIGHT LOSS, ABNORMAL (ICD-783.21) Patient never went for previously ordered blood work.  Orders: TLB-TSH (Thyroid Stimulating Hormone) (84443-TSH) TLB-T4 (Thyrox), Free [phone]) TLB-T3, Free (Triiodothyronine) (84481-T3FREE) T-RPR (Syphilis) [phone]) T- * Misc. Laboratory test [phone])   Complete Medication List: 1)  Metformin Hcl 500 Mg Tabs (Metformin hcl) .... Take 1 tablet by mouth once a day 2)  Glipizide 10 Mg Tabs (Glipizide) .... Take 1 tablet by mouth once a day   Patient Instructions: 1)  Please schedule a follow-up appointment in 1 month. 2)  Lipid Panel prior to visit, ICD-9:  272.4 3)  PSA prior to visit, ICD-9:  V76.44 4)  Please return for lab work one (1) week before your next appointment.     ]

## 2011-03-14 HISTORY — PX: CARDIAC DEFIBRILLATOR PLACEMENT: SHX171

## 2011-06-18 ENCOUNTER — Emergency Department (HOSPITAL_COMMUNITY): Payer: Medicare Other

## 2011-06-18 ENCOUNTER — Emergency Department (HOSPITAL_COMMUNITY)
Admission: EM | Admit: 2011-06-18 | Discharge: 2011-06-18 | Disposition: A | Discharge status: Home / Self Care | Payer: Medicare Other | Attending: Emergency Medicine | Admitting: Emergency Medicine

## 2011-06-18 DIAGNOSIS — J3489 Other specified disorders of nose and nasal sinuses: Secondary | ICD-10-CM | POA: Insufficient documentation

## 2011-06-18 DIAGNOSIS — I10 Essential (primary) hypertension: Secondary | ICD-10-CM | POA: Insufficient documentation

## 2011-06-18 DIAGNOSIS — R05 Cough: Secondary | ICD-10-CM | POA: Insufficient documentation

## 2011-06-18 DIAGNOSIS — R6883 Chills (without fever): Secondary | ICD-10-CM | POA: Insufficient documentation

## 2011-06-18 DIAGNOSIS — Z79899 Other long term (current) drug therapy: Secondary | ICD-10-CM | POA: Insufficient documentation

## 2011-06-18 DIAGNOSIS — R059 Cough, unspecified: Secondary | ICD-10-CM | POA: Insufficient documentation

## 2011-06-18 DIAGNOSIS — I2510 Atherosclerotic heart disease of native coronary artery without angina pectoris: Secondary | ICD-10-CM | POA: Insufficient documentation

## 2011-06-18 DIAGNOSIS — Z95810 Presence of automatic (implantable) cardiac defibrillator: Secondary | ICD-10-CM | POA: Insufficient documentation

## 2011-06-18 DIAGNOSIS — R079 Chest pain, unspecified: Secondary | ICD-10-CM | POA: Insufficient documentation

## 2011-06-18 DIAGNOSIS — E119 Type 2 diabetes mellitus without complications: Secondary | ICD-10-CM | POA: Insufficient documentation

## 2011-06-18 DIAGNOSIS — J069 Acute upper respiratory infection, unspecified: Secondary | ICD-10-CM | POA: Insufficient documentation

## 2011-06-18 DIAGNOSIS — Z7982 Long term (current) use of aspirin: Secondary | ICD-10-CM | POA: Insufficient documentation

## 2011-06-18 DIAGNOSIS — Z7901 Long term (current) use of anticoagulants: Secondary | ICD-10-CM | POA: Insufficient documentation

## 2011-06-18 LAB — COMPREHENSIVE METABOLIC PANEL
ALT: 21 U/L (ref 0–53)
AST: 21 U/L (ref 0–37)
Albumin: 3.7 g/dL (ref 3.5–5.2)
Alkaline Phosphatase: 81 U/L (ref 39–117)
BUN: 9 mg/dL (ref 6–23)
CO2: 26 mEq/L (ref 19–32)
Calcium: 9.5 mg/dL (ref 8.4–10.5)
Chloride: 105 mEq/L (ref 96–112)
Creatinine, Ser: 0.9 mg/dL (ref 0.50–1.35)
GFR calc Af Amer: >60 mL/min (ref >60)
GFR calc non Af Amer: >60 mL/min (ref >60)
Glucose, Bld: 248 mg/dL — ABNORMAL HIGH (ref 70–99)
Potassium: 3.8 mEq/L (ref 3.5–5.1)
Sodium: 139 mEq/L (ref 135–145)
Total Bilirubin: 0.6 mg/dL (ref 0.3–1.2)
Total Protein: 6.8 g/dL (ref 6.0–8.3)

## 2011-06-18 LAB — PROTIME-INR
INR: 2.07 — ABNORMAL HIGH (ref 0.00–1.49)
Prothrombin Time: 23.7 seconds — ABNORMAL HIGH (ref 11.6–15.2)

## 2011-06-18 LAB — POCT I-STAT TROPONIN I: Troponin i, poc: 0 ng/mL (ref 0.00–0.08)

## 2011-06-18 LAB — CBC
HCT: 40.1 % (ref 39.0–52.0)
Hemoglobin: 13.2 g/dL (ref 13.0–17.0)
MCH: 30.7 pg (ref 26.0–34.0)
MCHC: 32.9 g/dL (ref 30.0–36.0)
MCV: 93.3 fL (ref 78.0–100.0)
Platelets: 215 10*3/uL (ref 150–400)
RBC: 4.3 MIL/uL (ref 4.22–5.81)
RDW: 13.1 % (ref 11.5–15.5)
WBC: 13.6 10*3/uL — ABNORMAL HIGH (ref 4.0–10.5)

## 2011-06-18 IMAGING — CR DG CHEST 2V
2 series · 2 of 2 positions shown · non-contrast
Comparison: [DATE]

CLINICAL DATA: Left-sided chest pain.  Cough.

CHEST - 2 VIEW

[w chest pa]
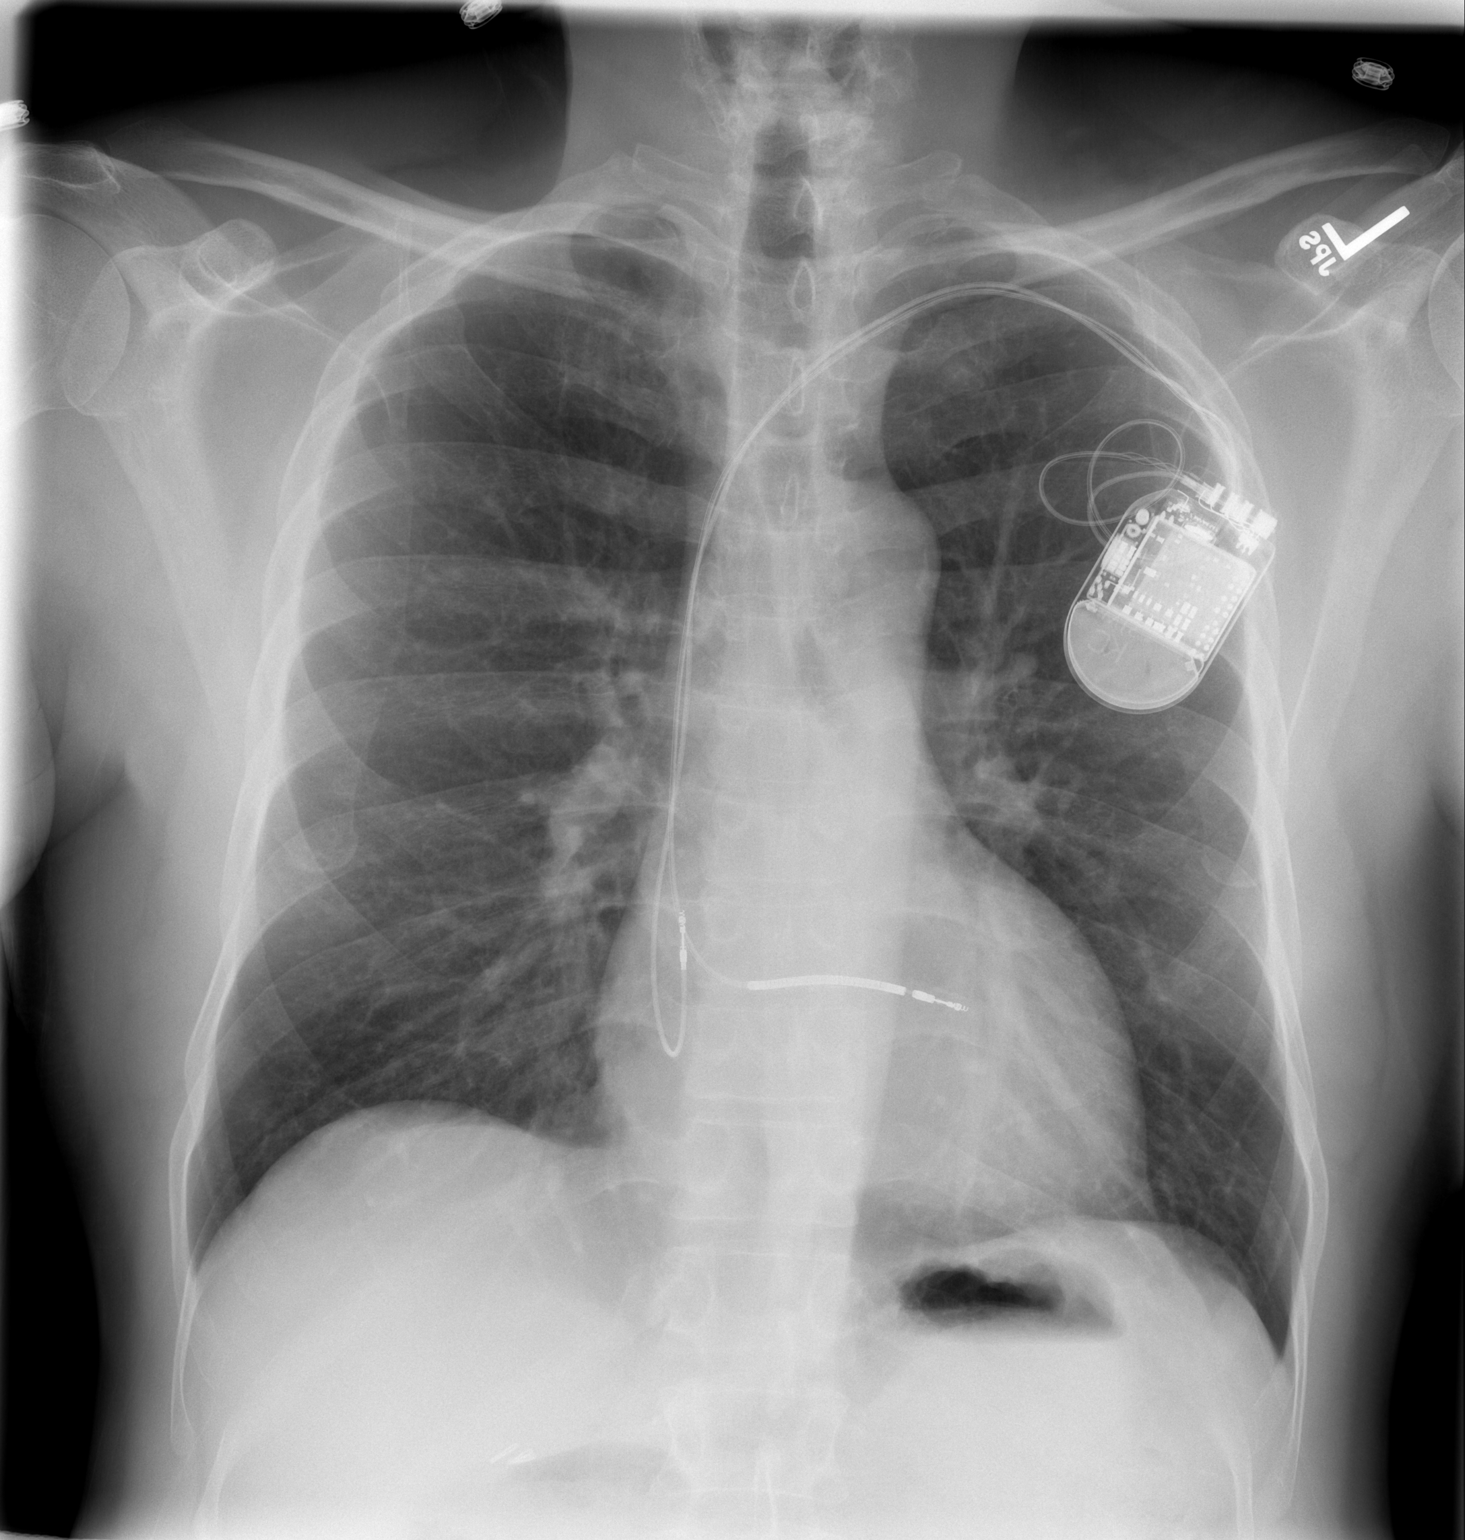

[w chest lat]
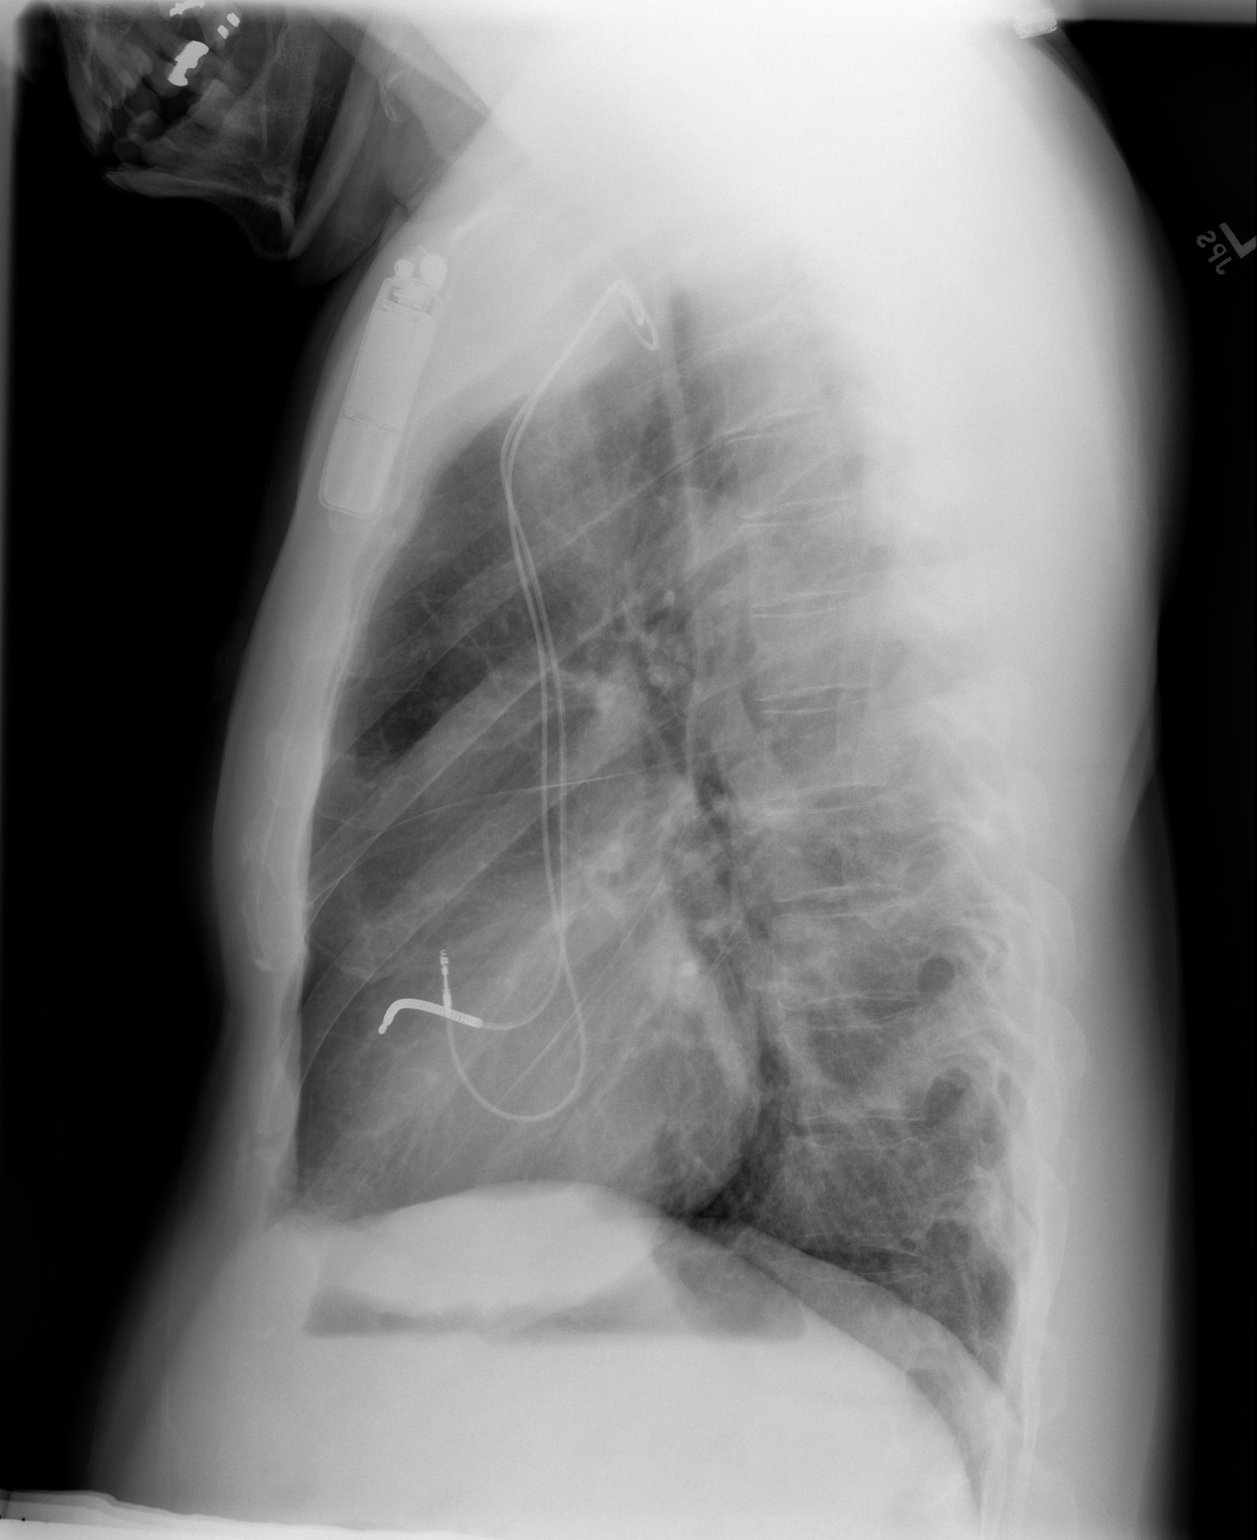

[2 of 2 positions shown; findings below may reference images not displayed]

FINDINGS: AICD in place.  Heart size and vascularity are normal and
the lungs are clear.  No osseous abnormality.
IMPRESSION: No acute abnormalities.

## 2011-06-24 ENCOUNTER — Emergency Department (HOSPITAL_COMMUNITY): Payer: Medicare Other

## 2011-06-24 ENCOUNTER — Inpatient Hospital Stay (HOSPITAL_COMMUNITY)
Admission: EM | Admit: 2011-06-24 | Discharge: 2011-06-28 | DRG: 194 | Disposition: A | Discharge status: Home / Self Care | Payer: Medicare Other | Attending: Internal Medicine | Admitting: Internal Medicine

## 2011-06-24 DIAGNOSIS — Z7982 Long term (current) use of aspirin: Secondary | ICD-10-CM

## 2011-06-24 DIAGNOSIS — R0789 Other chest pain: Secondary | ICD-10-CM | POA: Diagnosis present

## 2011-06-24 DIAGNOSIS — I10 Essential (primary) hypertension: Secondary | ICD-10-CM | POA: Diagnosis present

## 2011-06-24 DIAGNOSIS — Z794 Long term (current) use of insulin: Secondary | ICD-10-CM

## 2011-06-24 DIAGNOSIS — Z7901 Long term (current) use of anticoagulants: Secondary | ICD-10-CM

## 2011-06-24 DIAGNOSIS — N62 Hypertrophy of breast: Secondary | ICD-10-CM | POA: Diagnosis present

## 2011-06-24 DIAGNOSIS — I509 Heart failure, unspecified: Secondary | ICD-10-CM | POA: Diagnosis present

## 2011-06-24 DIAGNOSIS — Z95810 Presence of automatic (implantable) cardiac defibrillator: Secondary | ICD-10-CM

## 2011-06-24 DIAGNOSIS — I871 Compression of vein: Secondary | ICD-10-CM | POA: Diagnosis present

## 2011-06-24 DIAGNOSIS — E119 Type 2 diabetes mellitus without complications: Secondary | ICD-10-CM | POA: Diagnosis present

## 2011-06-24 DIAGNOSIS — J189 Pneumonia, unspecified organism: Principal | ICD-10-CM | POA: Diagnosis present

## 2011-06-24 DIAGNOSIS — D3500 Benign neoplasm of unspecified adrenal gland: Secondary | ICD-10-CM | POA: Diagnosis present

## 2011-06-24 DIAGNOSIS — I428 Other cardiomyopathies: Secondary | ICD-10-CM | POA: Diagnosis present

## 2011-06-24 DIAGNOSIS — F17200 Nicotine dependence, unspecified, uncomplicated: Secondary | ICD-10-CM | POA: Diagnosis present

## 2011-06-24 LAB — CBC
HCT: 44 % (ref 39.0–52.0)
Hemoglobin: 14.4 g/dL (ref 13.0–17.0)
MCH: 30.6 pg (ref 26.0–34.0)
MCHC: 32.7 g/dL (ref 30.0–36.0)
MCV: 93.4 fL (ref 78.0–100.0)
Platelets: 212 10*3/uL (ref 150–400)
RBC: 4.71 MIL/uL (ref 4.22–5.81)
RDW: 12.9 % (ref 11.5–15.5)
WBC: 14.8 10*3/uL — ABNORMAL HIGH (ref 4.0–10.5)

## 2011-06-24 LAB — CK TOTAL AND CKMB (NOT AT ARMC)
CK, MB: 2.1 ng/mL (ref 0.3–4.0)
Relative Index: 0.9 (ref 0.0–2.5)
Total CK: 239 U/L — ABNORMAL HIGH (ref 7–232)

## 2011-06-24 LAB — BASIC METABOLIC PANEL
BUN: 17 mg/dL (ref 6–23)
CO2: 29 mEq/L (ref 19–32)
Calcium: 9.6 mg/dL (ref 8.4–10.5)
Chloride: 99 mEq/L (ref 96–112)
Creatinine, Ser: 1.1 mg/dL (ref 0.50–1.35)
GFR calc Af Amer: >60 mL/min (ref >60)
GFR calc non Af Amer: >60 mL/min (ref >60)
Glucose, Bld: 277 mg/dL — ABNORMAL HIGH (ref 70–99)
Potassium: 4.1 mEq/L (ref 3.5–5.1)
Sodium: 137 mEq/L (ref 135–145)

## 2011-06-24 LAB — DIFFERENTIAL
Basophils Absolute: 0 10*3/uL (ref 0.0–0.1)
Basophils Relative: 0 % (ref 0–1)
Eosinophils Absolute: 0.7 10*3/uL (ref 0.0–0.7)
Eosinophils Relative: 5 % (ref 0–5)
Lymphocytes Relative: 26 % (ref 12–46)
Lymphs Abs: 3.8 10*3/uL (ref 0.7–4.0)
Monocytes Absolute: 1.6 10*3/uL — ABNORMAL HIGH (ref 0.1–1.0)
Monocytes Relative: 11 % (ref 3–12)
Neutro Abs: 8.7 10*3/uL — ABNORMAL HIGH (ref 1.7–7.7)
Neutrophils Relative %: 58 % (ref 43–77)

## 2011-06-24 LAB — PRO B NATRIURETIC PEPTIDE: Pro B Natriuretic peptide (BNP): 187.9 pg/mL — ABNORMAL HIGH (ref 0–125)

## 2011-06-24 LAB — TROPONIN I: Troponin I: <0.3 ng/mL (ref <0.30)

## 2011-06-24 LAB — POCT I-STAT TROPONIN I: Troponin i, poc: 0.08 ng/mL (ref 0.00–0.08)

## 2011-06-24 LAB — PROTIME-INR
INR: 2.64 — ABNORMAL HIGH (ref 0.00–1.49)
Prothrombin Time: 28.6 seconds — ABNORMAL HIGH (ref 11.6–15.2)

## 2011-06-24 IMAGING — CT CT ANGIO CHEST
1 of 2 series · 19 of 32 positions shown · IV contrast (APPLIED)
Comparison: [DATE] radiograph

CLINICAL DATA: Chest pain

CT ANGIOGRAPHY CHEST WITH CONTRAST
TECHNIQUE: Multidetector CT imaging of the chest was performed
using the standard protocol during bolus administration of
intravenous contrast.  Multiplanar CT image reconstructions
including MIPs were obtained to evaluate the vascular anatomy.
Contrast:  100 ml [NC]

[Series 7: thins for pacs · axial · 0.74mm/px · z∈[-242,+26]mm · 19 of 299 slices shown]
[im 15/299  lung]
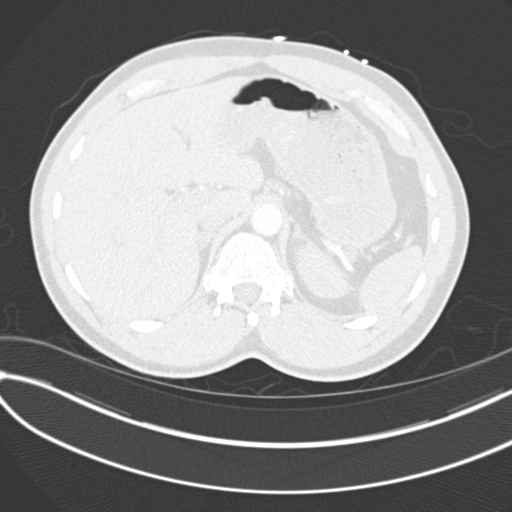
[im 30/299  mediastinal]
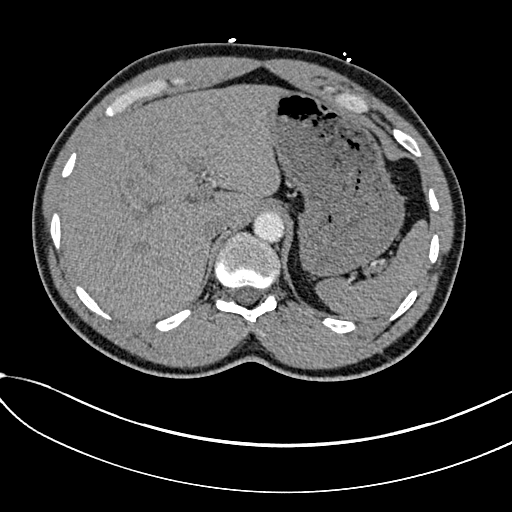
[im 45/299  lung]
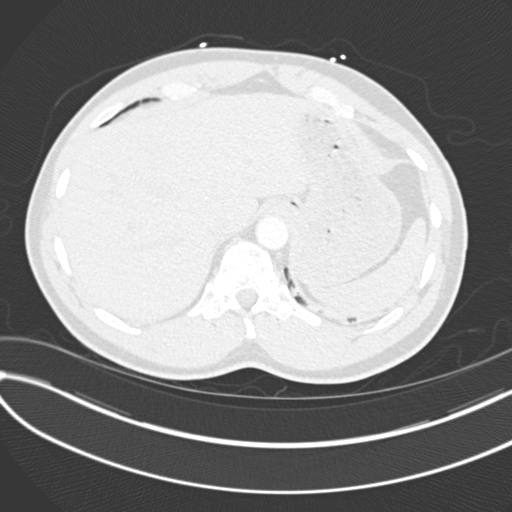
[im 75/299  mediastinal]
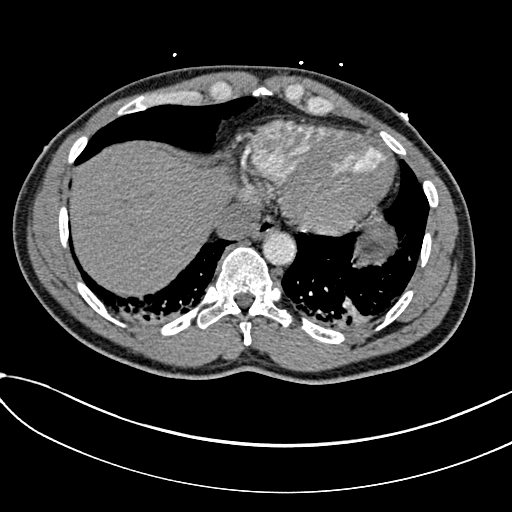
[im 90/299  lung]
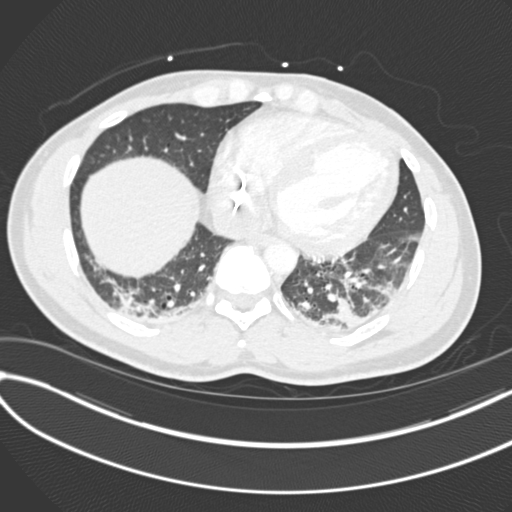
[im 100/299  mediastinal]
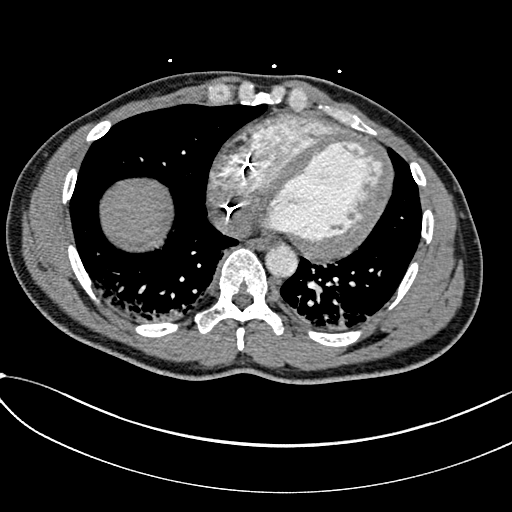
[im 105/299  lung]
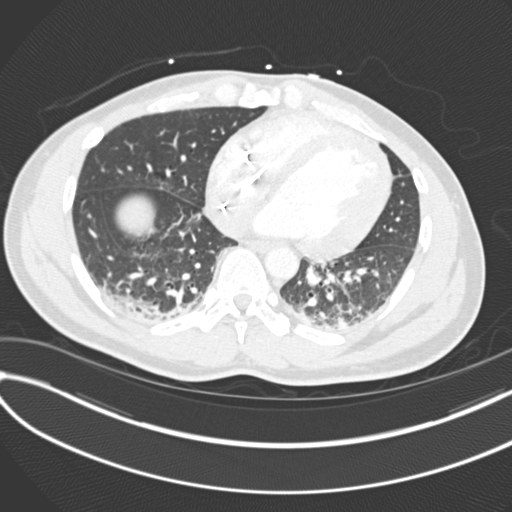
[im 120/299  mediastinal]
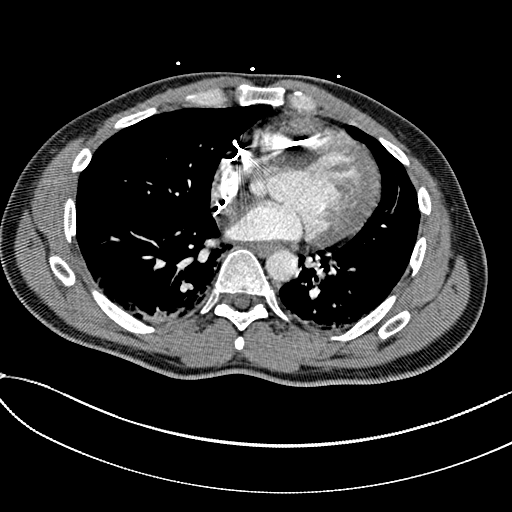
[im 135/299  lung]
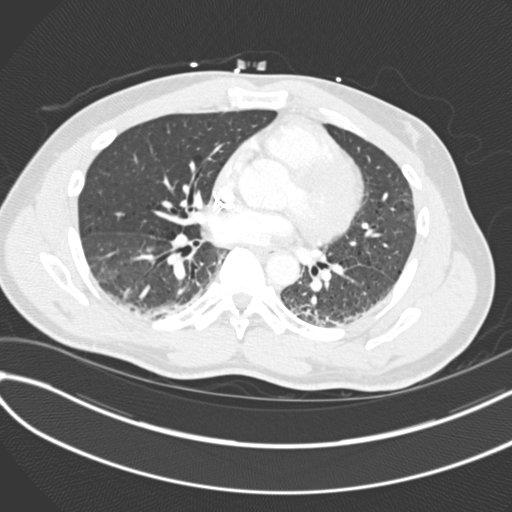
[im 150/299  mediastinal]
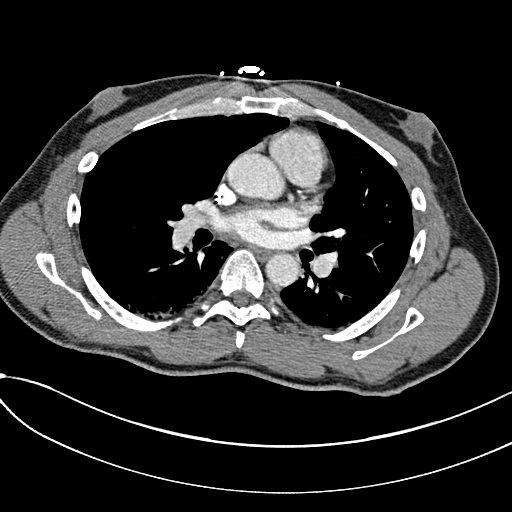
[im 164/299  lung]
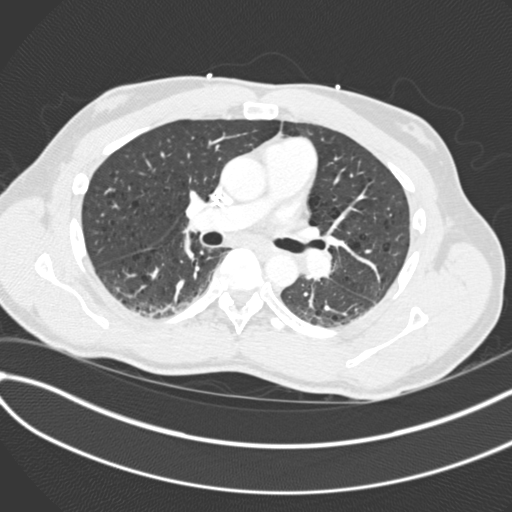
[im 179/299  mediastinal]
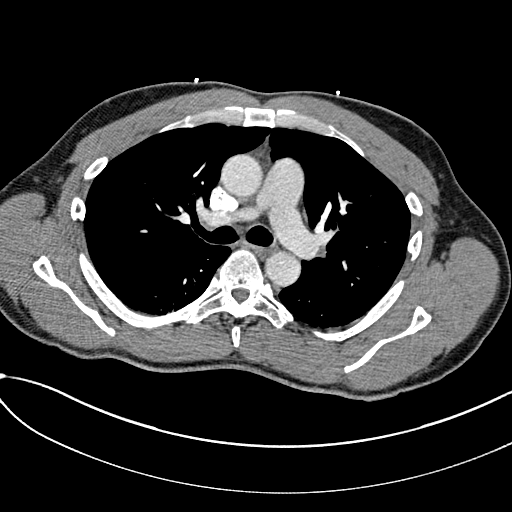
[im 194/299  lung]
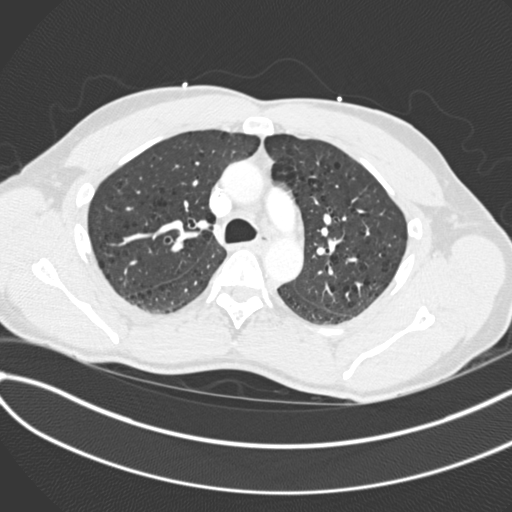
[im 199/299  mediastinal]
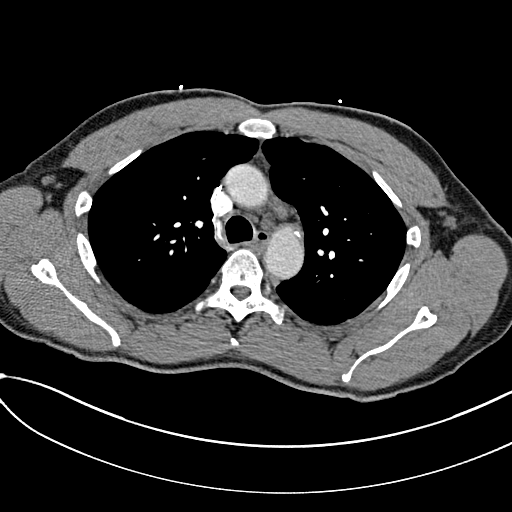
[im 209/299  lung]
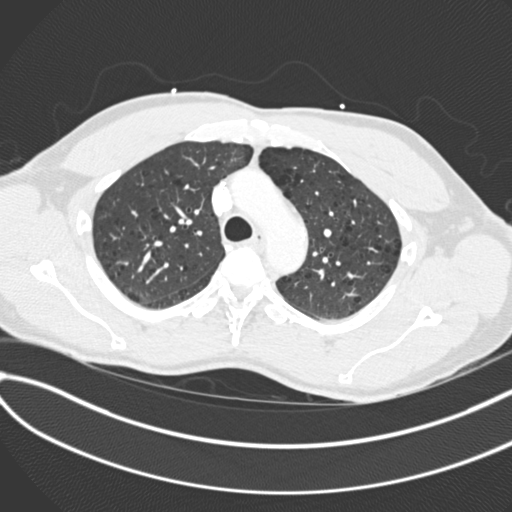
[im 224/299  mediastinal]
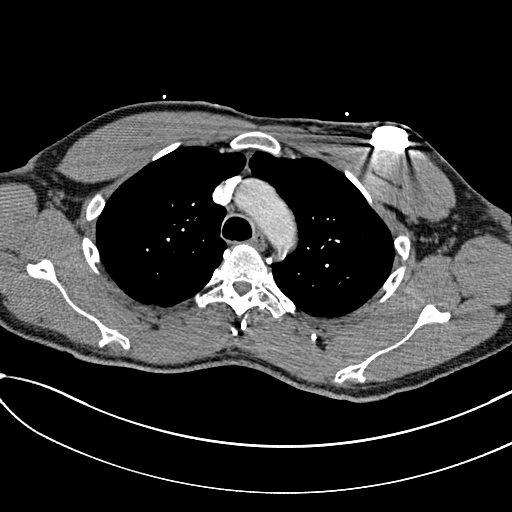
[im 254/299  lung]
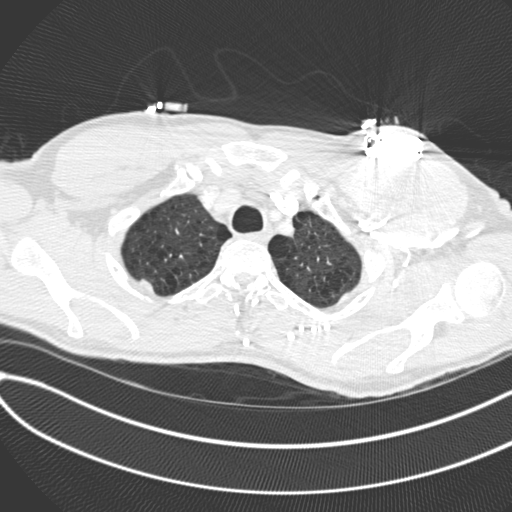
[im 269/299  mediastinal]
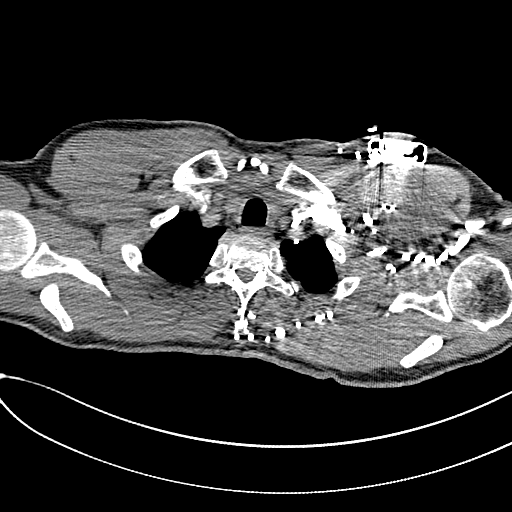
[im 284/299  lung]
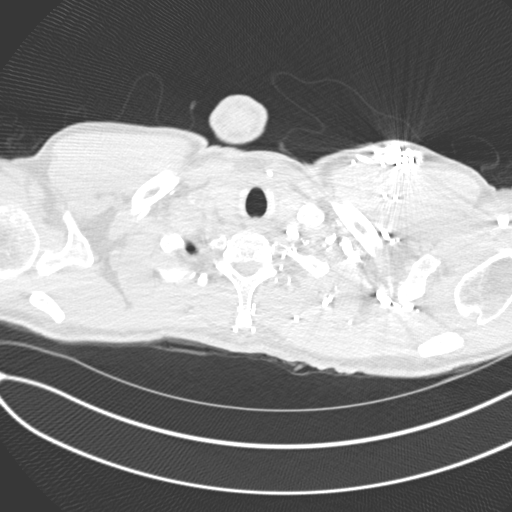

[19 of 32 positions shown; findings below may reference images not displayed]

FINDINGS: No main or lobar branch pulmonary embolism.  Suboptimal
evaluation of the segmental and subsegmental branches.  The aorta
is of normal course and caliber with scattered atherosclerotic
calcification.  No lymphadenopathy.  Prominent left posterior chest
wall collateral vessels, may be secondary to the large contrast
bolus however subclavian vein stenosis not excluded.  Left chest
wall battery pack with dual leads, terminating in the right atrium
and right ventricle.  The heart size is upper normal limits to
mildly enlarged.  No pericardial effusion.  No pleural effusions.

The central airways are patent.  Centrolobular emphysematous
changes are present.  Bilateral lower lobe scarring versus
atelectasis versus infiltrate.  No pneumothorax.

Limited images through the upper abdomen show an indeterminate
right adrenal nodule measuring 2.2 cm.  Status post
cholecystectomy.

No aggressive osseous abnormality.

Review of the MIP images confirms the above findings.
IMPRESSION: No main or lobar branch pulmonary embolism.  Suboptimal evaluation
of the segmental and subsegmental branches due to contrast bolus
timing.

Left chest wall collateral vessels are nonspecific, may reflect
chronic left subclavian vein narrowing or thrombus.

Centrolobular emphysematous changes.

Bibasilar airspace opacities; atelectasis, scarring, or infiltrate.

Indeterminate 2.2-cm right adrenal nodule.  Adrenal protocol CT
follow-up is recommended.

## 2011-06-24 IMAGING — CR DG CHEST 2V
2 series · 2 of 2 positions shown · non-contrast
Comparison: [DATE]

CLINICAL DATA: Chest pain

CHEST - 2 VIEW

[w chest pa]
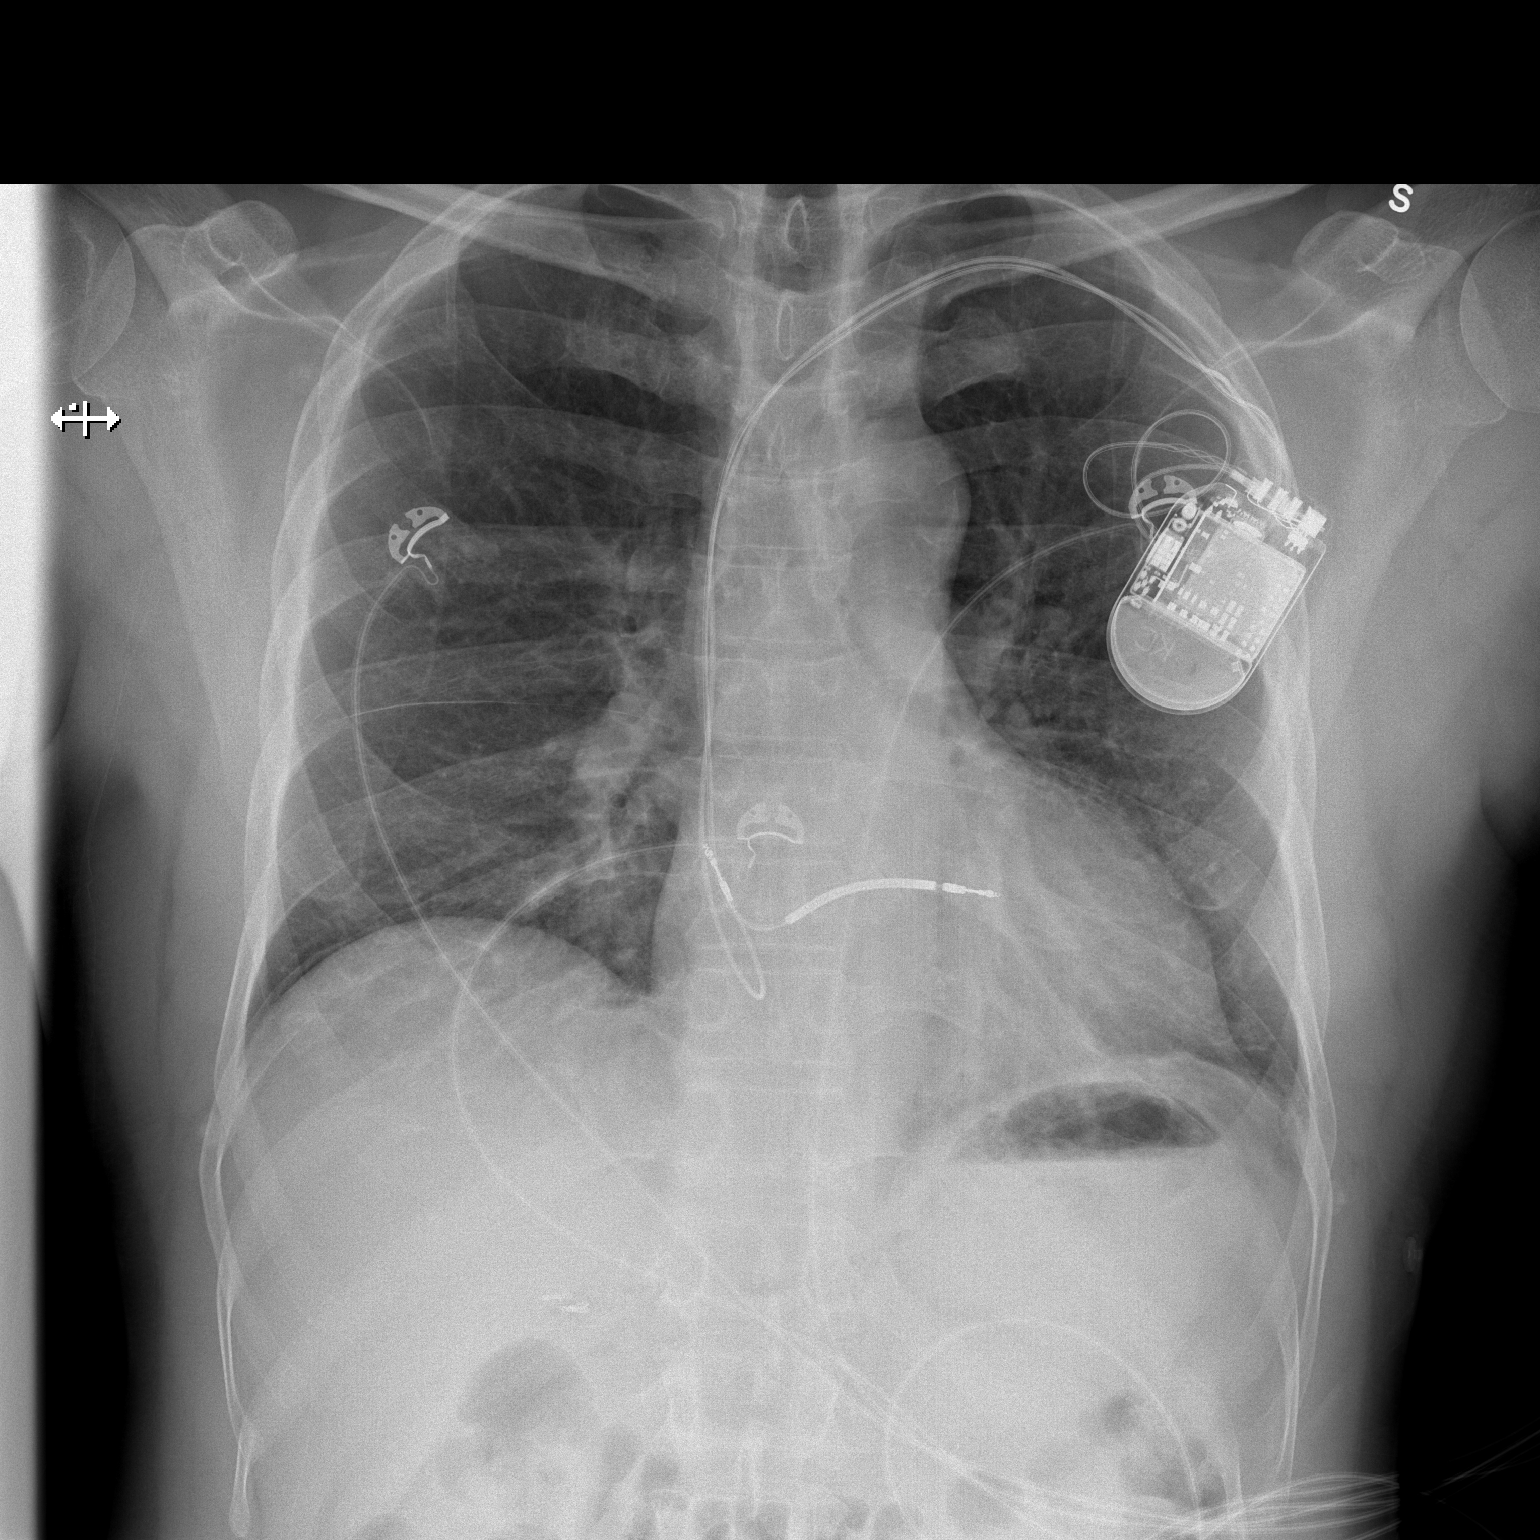

[w chest lat]
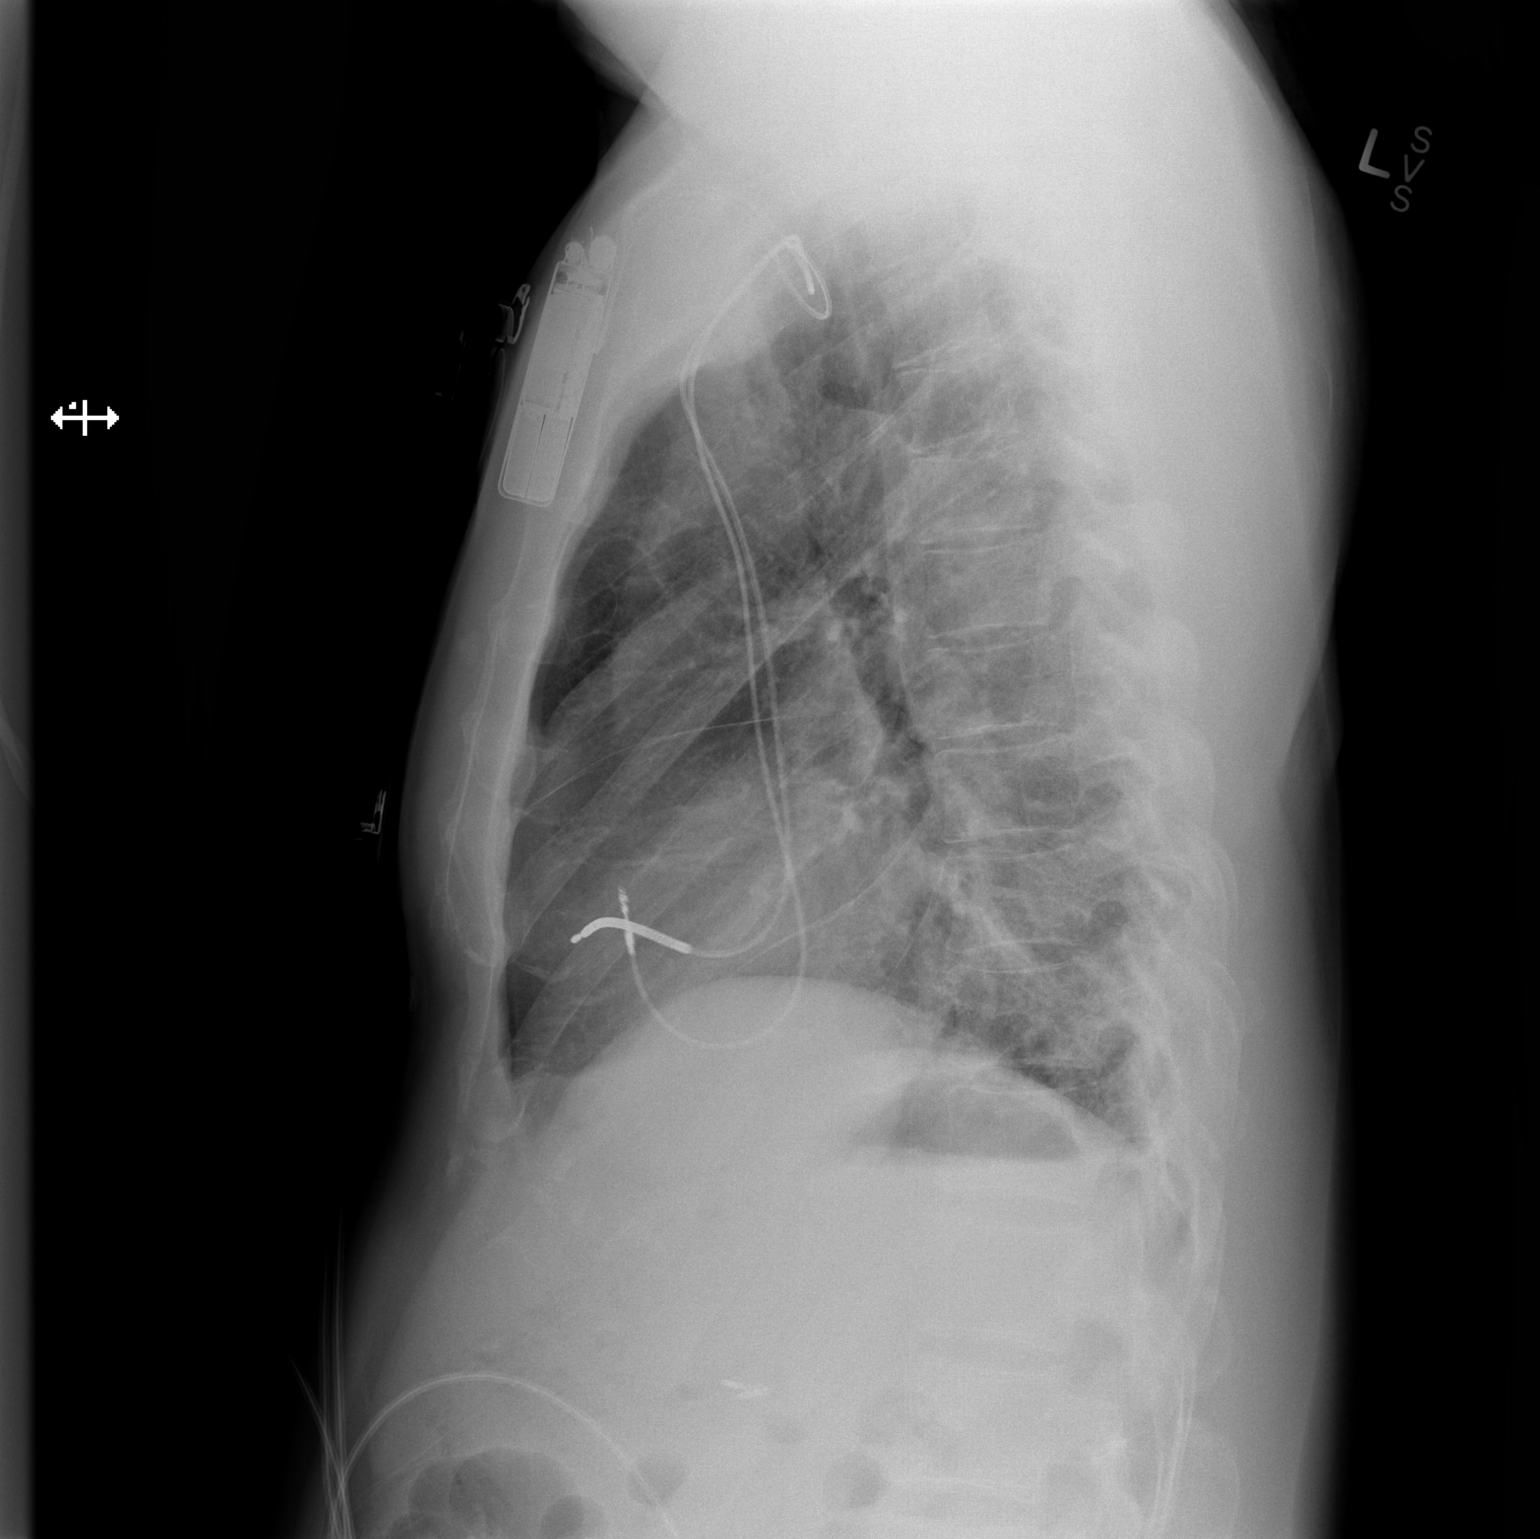

[2 of 2 positions shown; findings below may reference images not displayed]

FINDINGS: Left chest wall AICD with dual leads, unchanged in
position. Similar mild interstitial prominence mild retrocardiac
linear opacity.  Otherwise, without focal consolidation.  Stable
cardiomediastinal contours.  No pneumothorax.  No aggressive or
acute osseous abnormality.  Surgical clips right upper quadrant.
IMPRESSION: Mild linear retrocardiac opacity is favored to represent scarring
or atelectasis.  Otherwise, no acute process identified.

## 2011-06-24 MED ORDER — IOHEXOL 300 MG/ML  SOLN
100.0000 mL | Freq: Once | INTRAMUSCULAR | Status: AC | PRN
  Administered 2011-06-24: 100 mL via INTRAVENOUS

## 2011-06-25 LAB — PROTIME-INR
INR: 2.35 — ABNORMAL HIGH (ref 0.00–1.49)
Prothrombin Time: 26.1 seconds — ABNORMAL HIGH (ref 11.6–15.2)

## 2011-06-25 LAB — CBC
HCT: 43 % (ref 39.0–52.0)
Hemoglobin: 14.2 g/dL (ref 13.0–17.0)
MCH: 30.7 pg (ref 26.0–34.0)
MCHC: 33 g/dL (ref 30.0–36.0)
MCV: 92.9 fL (ref 78.0–100.0)
Platelets: 203 10*3/uL (ref 150–400)
RBC: 4.63 MIL/uL (ref 4.22–5.81)
RDW: 12.9 % (ref 11.5–15.5)
WBC: 11.3 10*3/uL — ABNORMAL HIGH (ref 4.0–10.5)

## 2011-06-25 LAB — GLUCOSE, CAPILLARY
Glucose-Capillary: 229 mg/dL — ABNORMAL HIGH (ref 70–99)
Glucose-Capillary: 149 mg/dL — ABNORMAL HIGH (ref 70–99)
Glucose-Capillary: 192 mg/dL — ABNORMAL HIGH (ref 70–99)
Glucose-Capillary: 207 mg/dL — ABNORMAL HIGH (ref 70–99)

## 2011-06-25 LAB — BASIC METABOLIC PANEL
BUN: 13 mg/dL (ref 6–23)
CO2: 29 mEq/L (ref 19–32)
Calcium: 9.6 mg/dL (ref 8.4–10.5)
Chloride: 102 mEq/L (ref 96–112)
Creatinine, Ser: 0.9 mg/dL (ref 0.50–1.35)
GFR calc Af Amer: >60 mL/min (ref >60)
GFR calc non Af Amer: >60 mL/min (ref >60)
Glucose, Bld: 232 mg/dL — ABNORMAL HIGH (ref 70–99)
Potassium: 4.3 mEq/L (ref 3.5–5.1)
Sodium: 136 mEq/L (ref 135–145)

## 2011-06-25 LAB — MAGNESIUM: Magnesium: 1.9 mg/dL (ref 1.5–2.5)

## 2011-06-25 LAB — CK TOTAL AND CKMB (NOT AT ARMC)
CK, MB: 1.9 ng/mL (ref 0.3–4.0)
Relative Index: 1 (ref 0.0–2.5)
Total CK: 199 U/L (ref 7–232)

## 2011-06-25 LAB — SEDIMENTATION RATE: Sed Rate: 5 mm/hr (ref 0–16)

## 2011-06-25 LAB — TROPONIN I: Troponin I: <0.3 ng/mL (ref <0.30)

## 2011-06-25 LAB — PHOSPHORUS: Phosphorus: 3.3 mg/dL (ref 2.3–4.6)

## 2011-06-25 LAB — PRO B NATRIURETIC PEPTIDE: Pro B Natriuretic peptide (BNP): 122.7 pg/mL (ref 0–125)

## 2011-06-26 ENCOUNTER — Encounter (HOSPITAL_COMMUNITY): Payer: Self-pay | Admitting: Radiology

## 2011-06-26 ENCOUNTER — Inpatient Hospital Stay (HOSPITAL_COMMUNITY): Payer: Medicare Other

## 2011-06-26 LAB — GLUCOSE, CAPILLARY
Glucose-Capillary: 182 mg/dL — ABNORMAL HIGH (ref 70–99)
Glucose-Capillary: 168 mg/dL — ABNORMAL HIGH (ref 70–99)
Glucose-Capillary: 212 mg/dL — ABNORMAL HIGH (ref 70–99)
Glucose-Capillary: 176 mg/dL — ABNORMAL HIGH (ref 70–99)

## 2011-06-26 LAB — CBC
HCT: 42.8 % (ref 39.0–52.0)
Hemoglobin: 13.9 g/dL (ref 13.0–17.0)
MCH: 30.3 pg (ref 26.0–34.0)
MCHC: 32.5 g/dL (ref 30.0–36.0)
MCV: 93.4 fL (ref 78.0–100.0)
Platelets: 218 10*3/uL (ref 150–400)
RBC: 4.58 MIL/uL (ref 4.22–5.81)
RDW: 12.7 % (ref 11.5–15.5)
WBC: 11 10*3/uL — ABNORMAL HIGH (ref 4.0–10.5)

## 2011-06-26 LAB — PROTIME-INR
INR: 1.74 — ABNORMAL HIGH (ref 0.00–1.49)
Prothrombin Time: 20.7 seconds — ABNORMAL HIGH (ref 11.6–15.2)

## 2011-06-26 LAB — BASIC METABOLIC PANEL
BUN: 14 mg/dL (ref 6–23)
CO2: 29 mEq/L (ref 19–32)
Calcium: 9.2 mg/dL (ref 8.4–10.5)
Chloride: 100 mEq/L (ref 96–112)
Creatinine, Ser: 0.92 mg/dL (ref 0.50–1.35)
GFR calc Af Amer: >60 mL/min (ref >60)
GFR calc non Af Amer: >60 mL/min (ref >60)
Glucose, Bld: 243 mg/dL — ABNORMAL HIGH (ref 70–99)
Potassium: 4.1 mEq/L (ref 3.5–5.1)
Sodium: 137 mEq/L (ref 135–145)

## 2011-06-26 IMAGING — CT CT ABDOMEN W/O CM
2 of 3 series · 17 of 46 positions shown, 19 images · non-contrast
Comparison: Chest CT on [DATE]

CLINICAL DATA: Right adrenal mass partially visualized on recent
chest CT.

CT ABDOMEN WITHOUT CONTRAST
TECHNIQUE: Multidetector CT imaging of the abdomen was performed
following the standard protocol without IV contrast.

[Series 2: adrenals · axial · 0.72mm/px · z∈[-296,-52]mm · 14 of 93 slices shown, 16 images]
[im 6/93  soft-tissue]
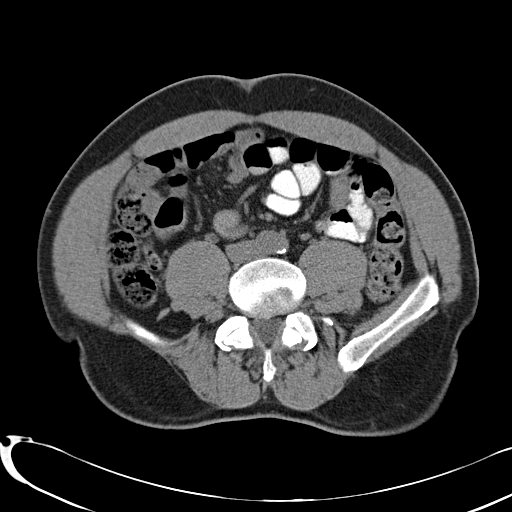
[im 6/93  bone]
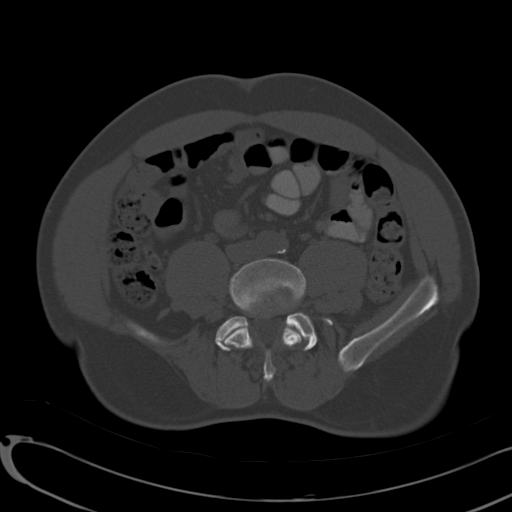
[im 12/93  soft-tissue]
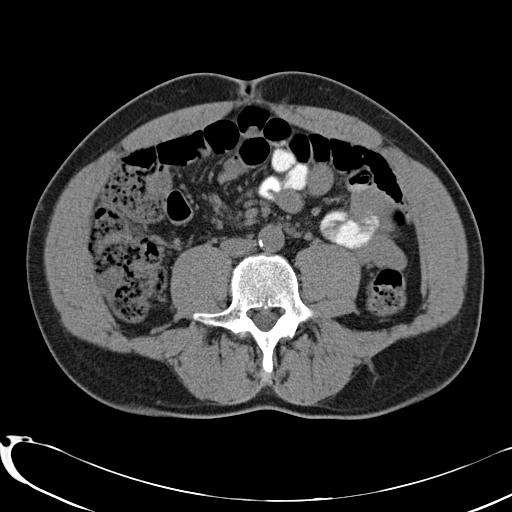
[im 18/93  soft-tissue]
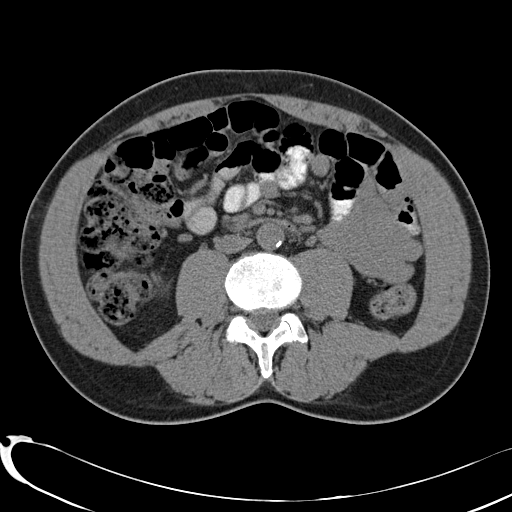
[im 24/93  soft-tissue]
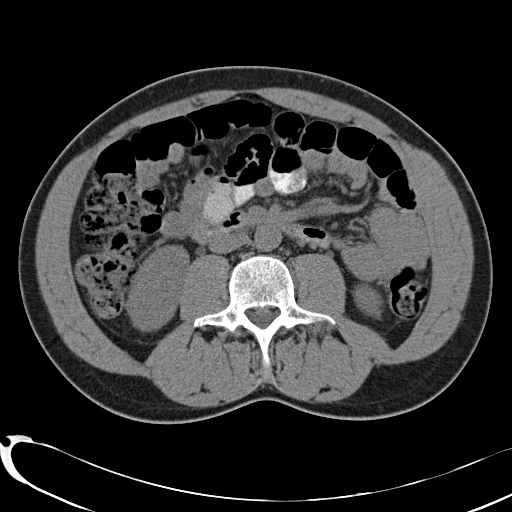
[im 30/93  soft-tissue]
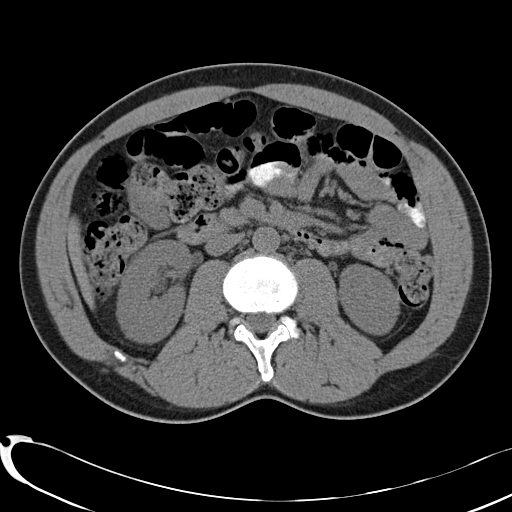
[im 36/93  soft-tissue]
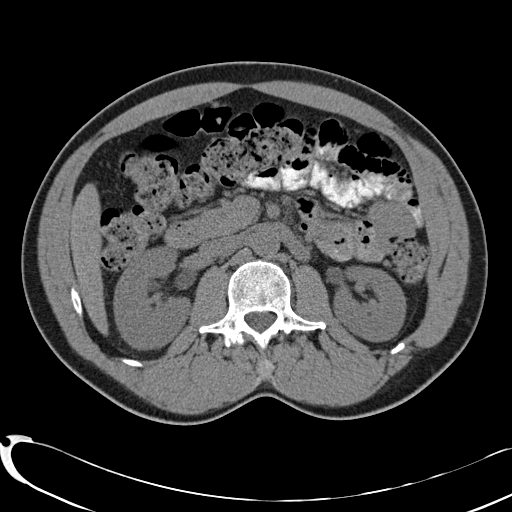
[im 42/93  soft-tissue]
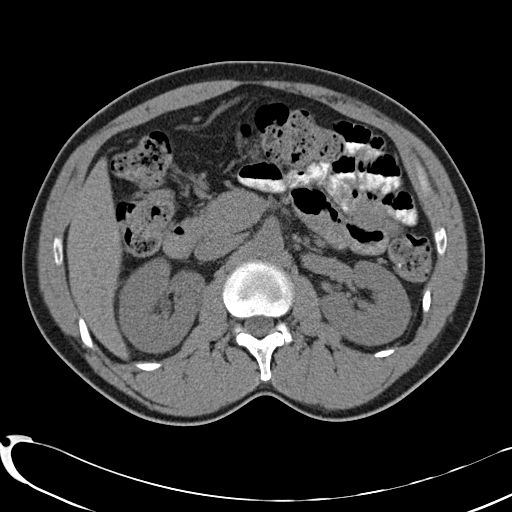
[im 51/93  soft-tissue]
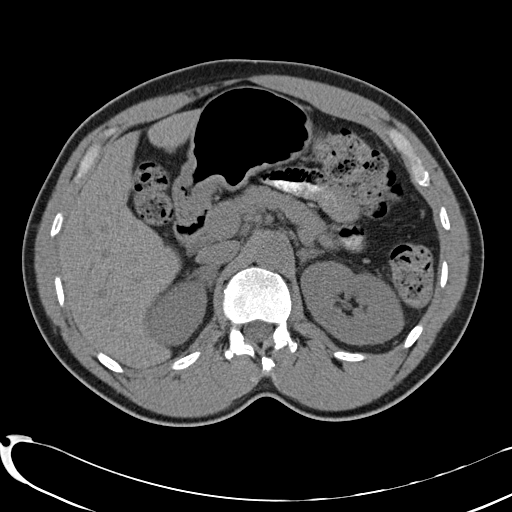
[im 57/93  soft-tissue]
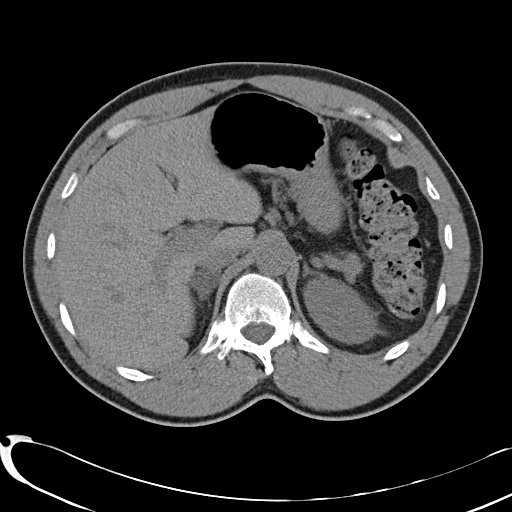
[im 57/93  bone]
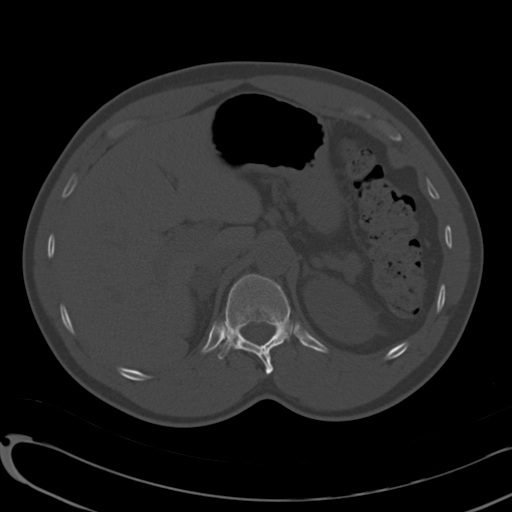
[im 63/93  soft-tissue]
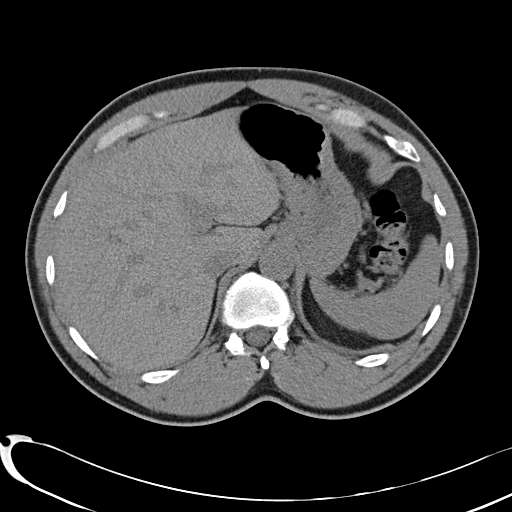
[im 69/93  soft-tissue]
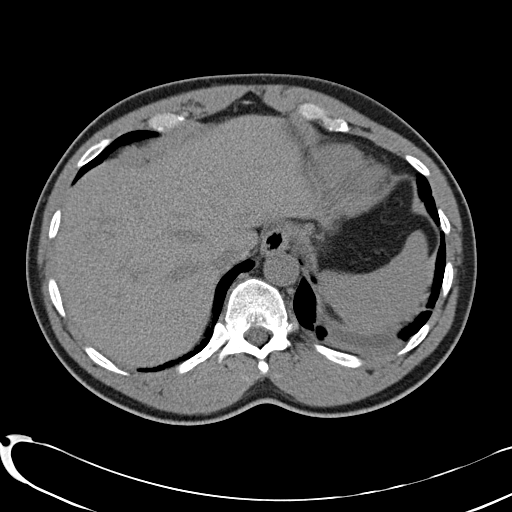
[im 75/93  soft-tissue]
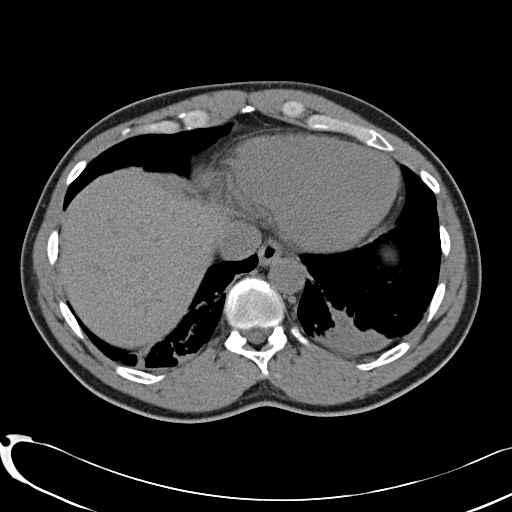
[im 81/93  soft-tissue]
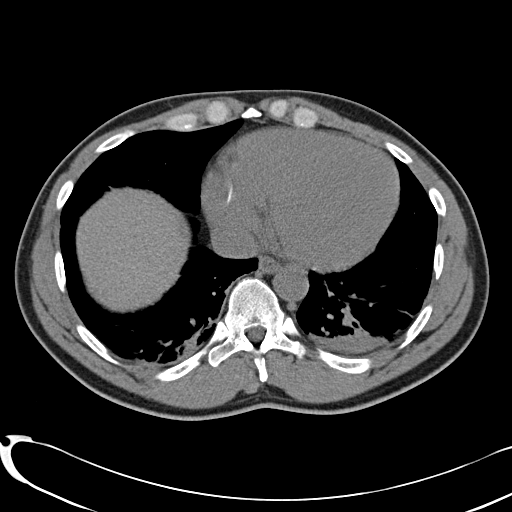
[im 87/93  soft-tissue]
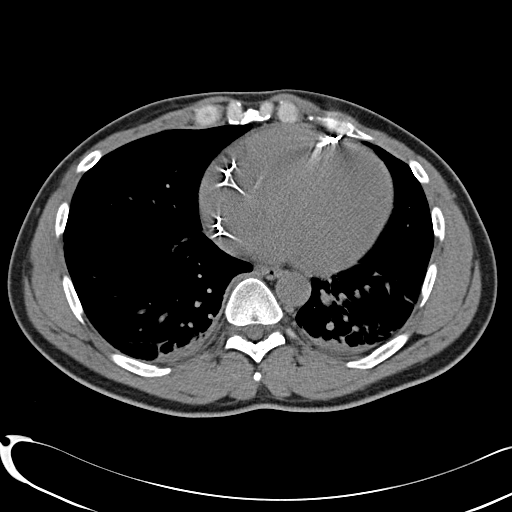

[Series 602: <mpr thick range> · coronal · 0.72mm/px · 3 of 82 slices shown]
[im 28/82  soft-tissue]
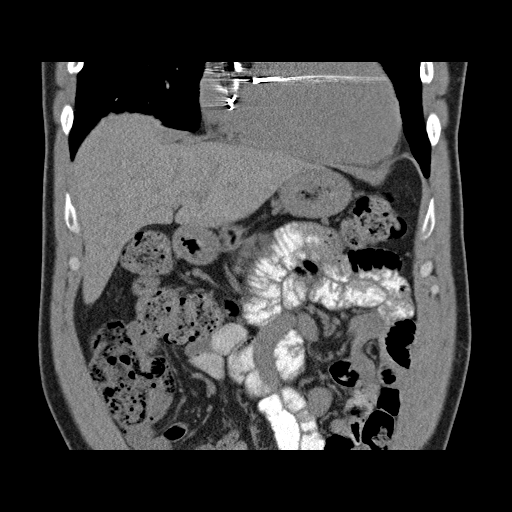
[im 37/82  soft-tissue]
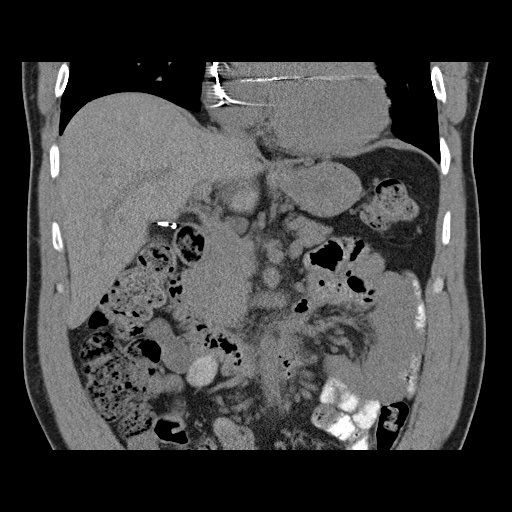
[im 46/82  soft-tissue]
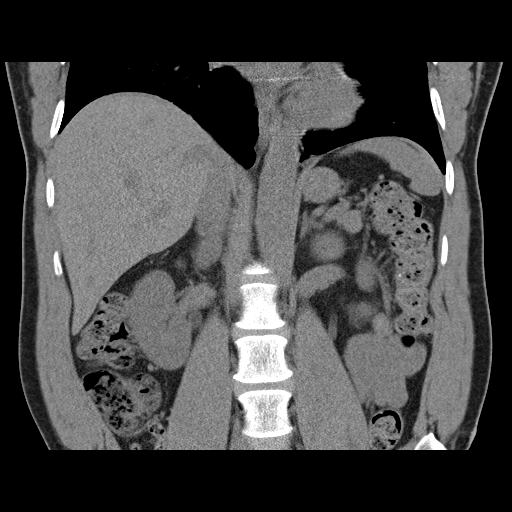

[17 of 46 positions shown; findings below may reference images not displayed]

FINDINGS: A low attenuation right adrenal mass is seen which
measures 2.2 x 2.8 cm.  This has an attenuation value of 0 HU which
is consistent with a benign adrenal adenoma.

The left adrenal gland is normal in appearance.  The other
abdominal parenchymal organs have a normal appearance on this
noncontrast study.  No evidence of hydronephrosis.  Surgical clips
are seen from prior cholecystectomy.  No other soft tissue masses
or inflammatory process identified within the abdomen.  Images
through the lung bases show persistent atelectasis or infiltrate
bilaterally.
IMPRESSION: 1.  2.8 cm benign right adrenal adenoma.
2.  Persistent bilateral lower lobe infiltrates or atelectasis.

## 2011-06-27 LAB — GLUCOSE, CAPILLARY
Glucose-Capillary: 199 mg/dL — ABNORMAL HIGH (ref 70–99)
Glucose-Capillary: 186 mg/dL — ABNORMAL HIGH (ref 70–99)
Glucose-Capillary: 147 mg/dL — ABNORMAL HIGH (ref 70–99)
Glucose-Capillary: 255 mg/dL — ABNORMAL HIGH (ref 70–99)

## 2011-06-27 LAB — CBC
HCT: 42.1 % (ref 39.0–52.0)
Hemoglobin: 13.5 g/dL (ref 13.0–17.0)
MCH: 30 pg (ref 26.0–34.0)
MCHC: 32.1 g/dL (ref 30.0–36.0)
MCV: 93.6 fL (ref 78.0–100.0)
Platelets: 228 10*3/uL (ref 150–400)
RBC: 4.5 MIL/uL (ref 4.22–5.81)
RDW: 12.6 % (ref 11.5–15.5)
WBC: 9.1 10*3/uL (ref 4.0–10.5)

## 2011-06-27 LAB — PROTIME-INR
INR: 1.91 — ABNORMAL HIGH (ref 0.00–1.49)
Prothrombin Time: 22.2 seconds — ABNORMAL HIGH (ref 11.6–15.2)

## 2011-06-27 LAB — HEMOGLOBIN A1C
Hgb A1c MFr Bld: 6.9 % — ABNORMAL HIGH (ref <5.7)
Mean Plasma Glucose: 151 mg/dL — ABNORMAL HIGH (ref <117)

## 2011-06-28 LAB — PROTIME-INR
INR: 1.91 — ABNORMAL HIGH (ref 0.00–1.49)
Prothrombin Time: 22.2 seconds — ABNORMAL HIGH (ref 11.6–15.2)

## 2011-06-28 LAB — GLUCOSE, CAPILLARY
Glucose-Capillary: 194 mg/dL — ABNORMAL HIGH (ref 70–99)
Glucose-Capillary: 163 mg/dL — ABNORMAL HIGH (ref 70–99)

## 2011-07-12 NOTE — Discharge Summary (Signed)
NAMECAROL, [name], MD     DATE OF BIRTH:  January 20, 1961  DATE OF ADMISSION:  06/24/2011 DATE OF DISCHARGE:  06/28/2011                              DISCHARGE SUMMARY   PRIMARY CARE PHYSICIAN:  Dr. Laural Benes at [facility] in [place].  CARDIOLOGIST:  [name], MD, at [facility].  CONSULTATIONS:  None obtained during this hospitalization.  IMAGING STUDIES DURING THIS HOSPITALIZATION: 1. Chest x-ray which showed mild linear retrocardiac opacity, favor to     represent scarring or atelectasis. 2. CT angio of the chest was done which showed no PE.  It did show     bilateral airspace opacities in the basilar area.  Atelectasis,     scarring or infiltrates were thought to be present.  There was an     indeterminate 2.2 cm right adrenal nodule.  Left chest wall     collateral vessels were seen which were nonspecific. 3. CT of the abdomen was done with adrenal protocol and it revealed     that the lesion seen on the CT angio of the chest was a 2.8 cm     benign right adrenal adenoma.  PERTINENT LABS:  Include a white cell count of 14,800 on admission.  INR of 2.64.  Rest of the labs were quite unremarkable.  DISCHARGE DIAGNOSES: 1. Community-acquired pneumonia, improved. 2. Left chest wall pain, possibly musculoskeletal but requires close     followup with PCP. 3. Right adrenal benign adenoma. 4. Abnormal-appearing subclavian vessel requiring outpatient     monitoring and followup with Cardiology. 5. History of cardiomyopathy, stable. 6. On long-term anticoagulation, stable. 7. History of diabetes, on insulin, stable.  BRIEF HOSPITAL COURSE:  Briefly, this is a 50 year old African American male with a past medical history of diabetes, cardiomyopathy, who presents with complaints of shortness  of breath and pain under the left arm area and the left chest wall area.  1. Pneumonia:  He was found to have CT findings suggestive of     pneumonia.  The patient was started on IV antibiotics and he has     significantly improved.  He was transitioned to p.o. antibiotics.  1. Left chest wall pain and arm pain:  It was mostly in the left chest     wall area, was tender to palpation.  The pain would worsen when he     would lift his left arm up.  It felt like this was musculoskeletal.     CT, however, did not show any discrete lesions in that area.  There     was a concern about gynecomastia as well so we held his     spironolactone; however, I do not think that is what is causing his     symptoms.  With oxycodone and some Toradol, his pain has improved.     This will need to be closely followed by his primary care     physician.  1. He has compensated cardiomyopathy which is stable.  We did get the     report of his echocardiogram.  Appears it was done in January of     this year and it showed moderately-reduced left ventricular     systolic function, regional wall motion abnormalities were also     appreciated; EF was 30%.  So the patient has cardiomyopathy.  He is     also on anticoagulation, the reason for which is not entirely     clear.  1. He has diabetes, on insulin, which is stable.  1. An adrenal tumor that was detected on the CT angio chest was found     to be a benign adenoma on CT of the abdomen.  So, on the day of discharge, the patient is feeling well.  He still has about 5/10 pain in the left chest wall area but is much better compared to when he came in.  He is quite satisfied with it.  He knows to follow up with his PCP for further evaluation of the same.  His vital signs are all stable.  He is saturating more than 90% on room air.  He has been ambulating with no difficulties.  His lungs are clear to auscultation.  His cardiovascular exam is benign. Abdomen is  soft.  Neurologically he is alert, oriented x3.  No focal neurological deficits are present.  So, overall, the patient is stable for discharge.  DISCHARGE MEDICATIONS: 1. Albuterol inhaler 2 puffs inhaled every 6 hours as needed for     shortness of breath. 2. Moxifloxacin 400 mg p.o. daily for 4 more days. 3. Oxycodone 5 mg every 4 hours as needed for pain. 4. Aspirin 325 mg p.o. daily. 5. Carvedilol 6.25 mg twice daily. 6. Coumadin as before. 7. Lantus 32 units at bedtime.  Please see the Medication     Reconciliation Sheet for the dose of the Coumadin. 8. Lisinopril 2.5 mg p.o. daily. 9. Simvastatin 40 mg p.o. daily. 10.Spironolactone 25 mg half tablet p.o. daily.  FOLLOWUP:  He has an appointment with his new PCP, Dr. Laural Benes, in [place] on September 18 which he should keep.  He should also call Dr. [name] for an appointment.  DIET:  Modified-carbohydrate.  ACTIVITY:  Physical activity as tolerated.  He has been given copies of reports of his imaging studies which he can take to his PCP.  Total time on this discharge encounter, 35 minutes.     Osvaldo Shipper, MD     GK/MEDQ  D:  06/28/2011  T:  06/28/2011  Job:  [id number]  cc:   [name], MD [facility] [address], [place], [place]  Dr. __________ [facility], [place].  Electronically Signed by Osvaldo Shipper MD on 07/12/2011 07:25:58 PM

## 2011-07-15 NOTE — H&P (Signed)
Dennis [name], Dennis [name]                 ACCOUNT NO.:  [id number]  MEDICAL RECORD NO.:  [id number]  LOCATION:  1313                         FACILITY:  [facility]  PHYSICIAN:  Thomasenia Bottoms, MDDATE OF BIRTH:  [birth year]  DATE OF ADMISSION:  06/24/2011 DATE OF DISCHARGE:                             HISTORY & PHYSICAL   PRIMARY CARE PHYSICIAN:  Dr. Laural Benes at [facility] in [place].  CARDIOLOGIST:  Dr. Glori Luis at [facility].  CHIEF COMPLAINT:  Shortness of breath and left underarm pain.  HISTORY OF PRESENT ILLNESS:  [name] is a pleasant 50 year old gentleman with history of cardiomyopathy who presents to the emergency department with two complaints really.  The first one is worsening pain in his left axillary area.  It is in the axillary line just to the lateral edges of the left breast.  That area is tender to palpation, it is tender when he takes a deep breath, it is tender when he moves from side to side.  It has been going on for about 2 weeks, but it has gotten much worse and in the last 2 days, it has become unbearable.  He can barely move without severe pain.  He denies any trauma, no idea why it started hurting.  The patient also, in the same timeframe, has been having trouble with increased cough and shortness of breath.  The cough is nonproductive.  Initially, his shortness of breath was only on exertion, but yesterday it got to the point where it was severe when he was just lying in bed and it definitely seems to be worse when he is supine rather than sitting up.  He says this feels very much like when he had congestive heart failure.  He has had pneumonia once more than 10 years ago, but he says then he would overwhelm with flu symptoms and heat, but at this time it is really more breathing, so he does report some fatigue and overall not feeling well.  PAST MEDICAL HISTORY:  Significant for cardiomyopathy.  We do not know his  ejection fraction, but sounds as if it is fairly severe.  He does have a cardiac defibrillator.  He says it was put in as a precaution because he was vulnerable to arrhythmias.  He is also on Coumadin.  He has history of diabetes mellitus type 2, hypertension.  He has never had an MI, never had a cardiac catheterization per his report, but his past medical history does have coronary artery disease; he is not sure why. He has history of cholecystectomy and congestive heart failure, he says the doctors think may be congenital in nature.  FAMILY HISTORY:  Unknown.  SOCIAL HISTORY:  He does smoke cigarettes.  No alcohol or illicit drugs. He used to live in [place], which is where his doctors are all in [place], but he does live in [place] now.  REVIEW OF SYSTEMS:  The patient, as mentioned above, denies any fevers. He has had a few chills.  Appetite has been slightly decreased over the last couple of days since his pain has been so high.  No headaches, no lower extremity edema, no joint pains.  The patient has gained some weight and says he has not quite been strict with his diet for his CHF and diabetes.  Otherwise, he has had the left pain as mentioned above. All other systems reviewed and are negative.  PHYSICAL EXAMINATION:  VITAL SIGNS:  On arrival in the emergency department, blood pressure 113/77, pulse 96 initially, respiratory rate 24, temperature 98.3. GENERAL:  The patient is well-nourished, well-developed and in no acute distress. HEENT EXAM:  Normocephalic, atraumatic.  His pupils equal and round. His sclera nonicteric.  Oral mucosa moist. NECK:  Supple.  No lymphadenopathy, no thyromegaly, no jugular venous distention. CARDIAC EXAM:  Regular rate and rhythm, but he does have systolic and a diastolic murmur. CHEST:  Also on his chest wall, he has some left-sided gynecomastia and he is quite tender to palpation on the left lateral edge of his breast. It also seems sort  of nodular in that area though I cannot palpate discrete lymph nodes or discrete masses.  He is slightly tender all the way up into his axillary area and there is no warmth or evidence of infection in that area.  I did not notice any skin tears or boils or erythema or warmth.  It is just slightly swollen and slightly tender throughout the whole area. ABDOMEN:  Soft, nontender, nondistended.  Normoactive bowel sounds.  No masses are appreciated.  No rebound or guarding.  No hepatosplenomegaly. EXTREMITIES:  Reveal no evidence of clubbing, cyanosis, or pitting edema.  He has palpable DP pulses bilaterally. SKIN:  Warm, dry and intact.  No open lesions or rashes. MUSCULOSKELETAL EXAMINATION:  Reveals no evidence of effusion of his joints. NEUROLOGICAL:  He is alert and oriented x3.  He is attentive and appropriate, normal affect.  Cranial nerves II through XII are intact grossly, has 5/5 strength in his upper and lower extremities.  His sensory exam is intact grossly to light touch in his upper and lower extremities.  He has normal muscle tone and bulk.  DATA:  The patient had a CT angiogram, which revealed no evidence of PE though was suboptimal for segmental and subsegmental branches.  He has no lymphadenopathy, but prominent left posterior chest wall collateral vessels, which may be secondary to large contrast bolus, however, subclavian vein stenosis cannot be excluded. Bilateral lower lobe scarring versus atelectasis versus infiltrate, no pneumothorax.  He has an indeterminate right adrenal nodule measuring 2.2 cm.  He is status post left cholecystectomy, centrilobular emphysematous changes.  CK is 239.  His troponin less than 0.3.  Chest x-ray revealed no pneumothorax. Surgical clips in the right upper quadrant.  Linear retrocardiac opacity is favored to represent scarring.  White count is 14.8, hemoglobin of 14.4, hematocrit 44.0, platelet count 212, BNP is 187.9, INR is 2.6, PTT is  28.6.  Sodium is 137, potassium 4.1, chloride 99, bicarb 29, glucose 277, BUN 17, creatinine 1.1,  repeat troponin 0.08.  The patient's EKG reveals normal sinus rhythm with a rate of 98.  No ST-segment elevation or depression.  I did review his EKG.  He does have poor R-wave progression.  ASSESSMENT AND PLAN: 1. Possible pneumonia.  The patient does have an elevated white blood     cell count.  We will put him on Rocephin and Zithromax and will     monitor him.  We would gently consider repeating that CT or     repeating some imaging because of the vague nondiagnostic CT     findings just to be  sure things improve with treatment. 2. Left axillary area pain or breast pain.  It is interesting that he     has some gynecomastia and palpable nodularity, which is tender to     palpation on that side.  Also, has the unnoticeable abnormal soft     tissue swelling in the left axilla.  For now, we are going to hold     his spironolactone.  I believe that causes gynecomastia.  We     consented     getting an MRI of the area for a better look.  I did discuss the CT     with radiologist.  There are no evidence of masses or nodes, just     malignancy, but just may want to keep an eye on that. 3. Diabetes mellitus.  We will continue his Lantus and add coverage     insulin for now.     Thomasenia Bottoms, MD     CVC/MEDQ  D:  06/25/2011  T:  06/25/2011  Job:  [id number]  cc:   Dr. Laural Benes [facility], [place]  Dr. Charolett Bumpers [facility]  Electronically Signed by Buena Irish MD on 07/15/2011 04:54:09 PM

## 2011-07-21 DIAGNOSIS — I255 Ischemic cardiomyopathy: Secondary | ICD-10-CM | POA: Insufficient documentation

## 2011-08-03 ENCOUNTER — Emergency Department (HOSPITAL_COMMUNITY)
Admission: EM | Admit: 2011-08-03 | Discharge: 2011-08-04 | Disposition: A | Discharge status: Home / Self Care | Payer: Medicare Other | Attending: Emergency Medicine | Admitting: Emergency Medicine

## 2011-08-03 ENCOUNTER — Emergency Department (HOSPITAL_COMMUNITY): Payer: Medicare Other

## 2011-08-03 DIAGNOSIS — I2510 Atherosclerotic heart disease of native coronary artery without angina pectoris: Secondary | ICD-10-CM | POA: Insufficient documentation

## 2011-08-03 DIAGNOSIS — Z79899 Other long term (current) drug therapy: Secondary | ICD-10-CM | POA: Insufficient documentation

## 2011-08-03 DIAGNOSIS — I10 Essential (primary) hypertension: Secondary | ICD-10-CM | POA: Insufficient documentation

## 2011-08-03 DIAGNOSIS — R0602 Shortness of breath: Secondary | ICD-10-CM | POA: Insufficient documentation

## 2011-08-03 DIAGNOSIS — R42 Dizziness and giddiness: Secondary | ICD-10-CM | POA: Insufficient documentation

## 2011-08-03 DIAGNOSIS — Z7982 Long term (current) use of aspirin: Secondary | ICD-10-CM | POA: Insufficient documentation

## 2011-08-03 DIAGNOSIS — F17200 Nicotine dependence, unspecified, uncomplicated: Secondary | ICD-10-CM | POA: Insufficient documentation

## 2011-08-03 DIAGNOSIS — R0789 Other chest pain: Secondary | ICD-10-CM | POA: Insufficient documentation

## 2011-08-03 DIAGNOSIS — Z794 Long term (current) use of insulin: Secondary | ICD-10-CM | POA: Insufficient documentation

## 2011-08-03 DIAGNOSIS — E119 Type 2 diabetes mellitus without complications: Secondary | ICD-10-CM | POA: Insufficient documentation

## 2011-08-03 DIAGNOSIS — Z95810 Presence of automatic (implantable) cardiac defibrillator: Secondary | ICD-10-CM | POA: Insufficient documentation

## 2011-08-03 DIAGNOSIS — Z7901 Long term (current) use of anticoagulants: Secondary | ICD-10-CM | POA: Insufficient documentation

## 2011-08-03 DIAGNOSIS — I509 Heart failure, unspecified: Secondary | ICD-10-CM | POA: Insufficient documentation

## 2011-08-03 LAB — BASIC METABOLIC PANEL
BUN: 14 mg/dL (ref 6–23)
CO2: 28 mEq/L (ref 19–32)
Calcium: 9.6 mg/dL (ref 8.4–10.5)
Chloride: 97 mEq/L (ref 96–112)
Creatinine, Ser: 1.03 mg/dL (ref 0.50–1.35)
GFR calc Af Amer: >90 mL/min (ref >90)
GFR calc non Af Amer: 84 mL/min — ABNORMAL LOW (ref >90)
Glucose, Bld: 462 mg/dL — ABNORMAL HIGH (ref 70–99)
Potassium: 4.4 mEq/L (ref 3.5–5.1)
Sodium: 133 mEq/L — ABNORMAL LOW (ref 135–145)

## 2011-08-03 LAB — GLUCOSE, CAPILLARY
Glucose-Capillary: 374 mg/dL — ABNORMAL HIGH (ref 70–99)
Glucose-Capillary: 429 mg/dL — ABNORMAL HIGH (ref 70–99)
Glucose-Capillary: 431 mg/dL — ABNORMAL HIGH (ref 70–99)

## 2011-08-03 LAB — PROTIME-INR
INR: 1.78 — ABNORMAL HIGH (ref 0.00–1.49)
Prothrombin Time: 21 seconds — ABNORMAL HIGH (ref 11.6–15.2)

## 2011-08-03 LAB — URINALYSIS, ROUTINE W REFLEX MICROSCOPIC
Bilirubin Urine: NEGATIVE
Glucose, UA: >1000 mg/dL — AB
Hgb urine dipstick: NEGATIVE
Ketones, ur: NEGATIVE mg/dL
Leukocytes, UA: NEGATIVE
Nitrite: NEGATIVE
Protein, ur: NEGATIVE mg/dL
Specific Gravity, Urine: 1.044 — ABNORMAL HIGH (ref 1.005–1.030)
Urobilinogen, UA: 0.2 mg/dL (ref 0.0–1.0)
pH: 6.5 (ref 5.0–8.0)

## 2011-08-03 LAB — DIFFERENTIAL
Basophils Absolute: 0 10*3/uL (ref 0.0–0.1)
Basophils Relative: 0 % (ref 0–1)
Eosinophils Absolute: 0.5 10*3/uL (ref 0.0–0.7)
Eosinophils Relative: 4 % (ref 0–5)
Lymphocytes Relative: 24 % (ref 12–46)
Lymphs Abs: 3.2 10*3/uL (ref 0.7–4.0)
Monocytes Absolute: 1.4 10*3/uL — ABNORMAL HIGH (ref 0.1–1.0)
Monocytes Relative: 11 % (ref 3–12)
Neutro Abs: 7.9 10*3/uL — ABNORMAL HIGH (ref 1.7–7.7)
Neutrophils Relative %: 61 % (ref 43–77)

## 2011-08-03 LAB — POCT I-STAT TROPONIN I: Troponin i, poc: 0.01 ng/mL (ref 0.00–0.08)

## 2011-08-03 LAB — CBC
HCT: 39 % (ref 39.0–52.0)
Hemoglobin: 12.7 g/dL — ABNORMAL LOW (ref 13.0–17.0)
MCH: 30.7 pg (ref 26.0–34.0)
MCHC: 32.6 g/dL (ref 30.0–36.0)
MCV: 94.2 fL (ref 78.0–100.0)
Platelets: 257 10*3/uL (ref 150–400)
RBC: 4.14 MIL/uL — ABNORMAL LOW (ref 4.22–5.81)
RDW: 12.6 % (ref 11.5–15.5)
WBC: 13 10*3/uL — ABNORMAL HIGH (ref 4.0–10.5)

## 2011-08-03 LAB — URINE MICROSCOPIC-ADD ON: Urine-Other: NONE SEEN

## 2011-08-03 LAB — D-DIMER, QUANTITATIVE: D-Dimer, Quant: 0.33 ug/mL-FEU (ref 0.00–0.48)

## 2011-08-03 IMAGING — CR DG CHEST 2V
2 series · 2 of 2 positions shown · non-contrast
Comparison: Chest radiograph and CTA of the chest performed
[DATE]

CLINICAL DATA: Chest pain.

CHEST - 2 VIEW

[w chest pa]
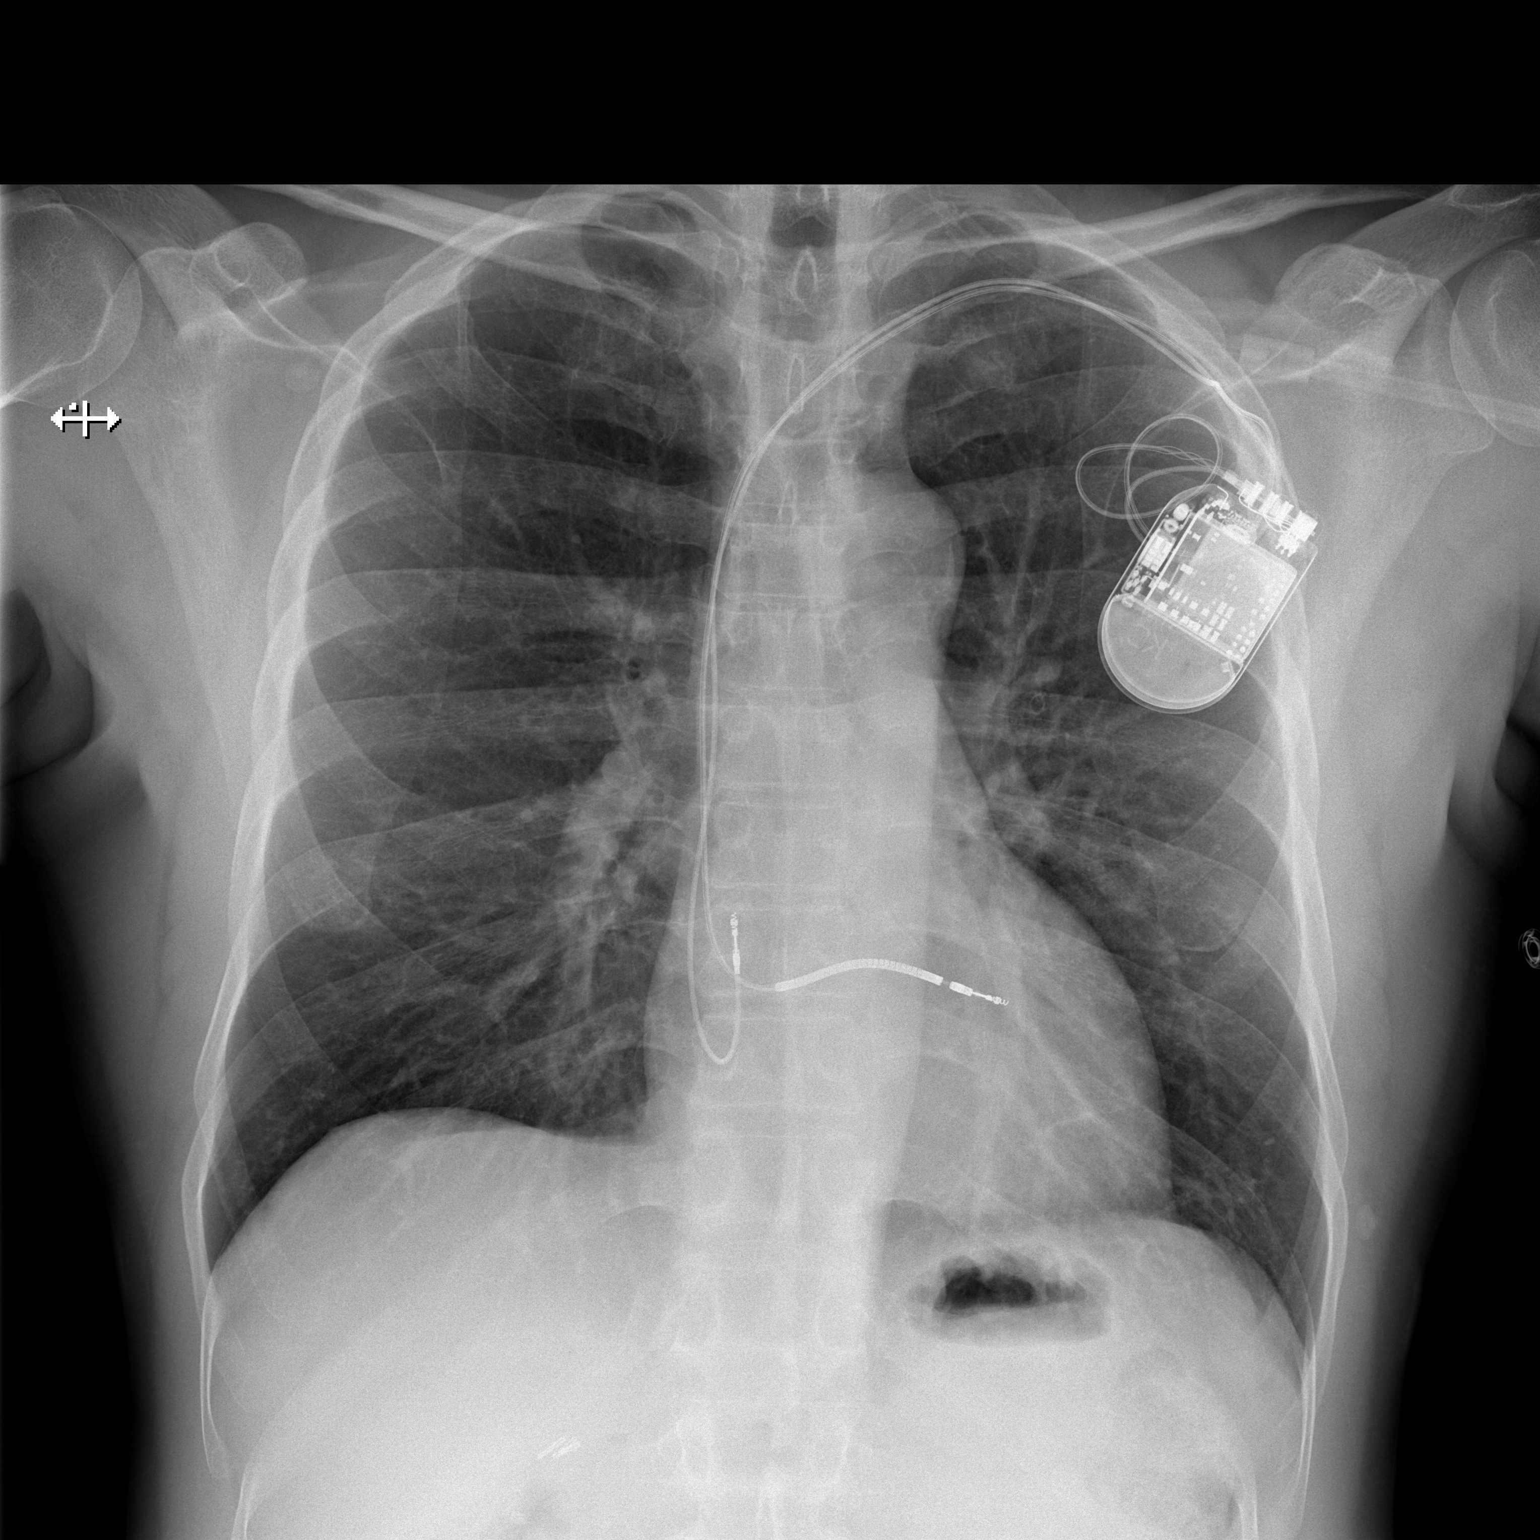

[w chest lat]
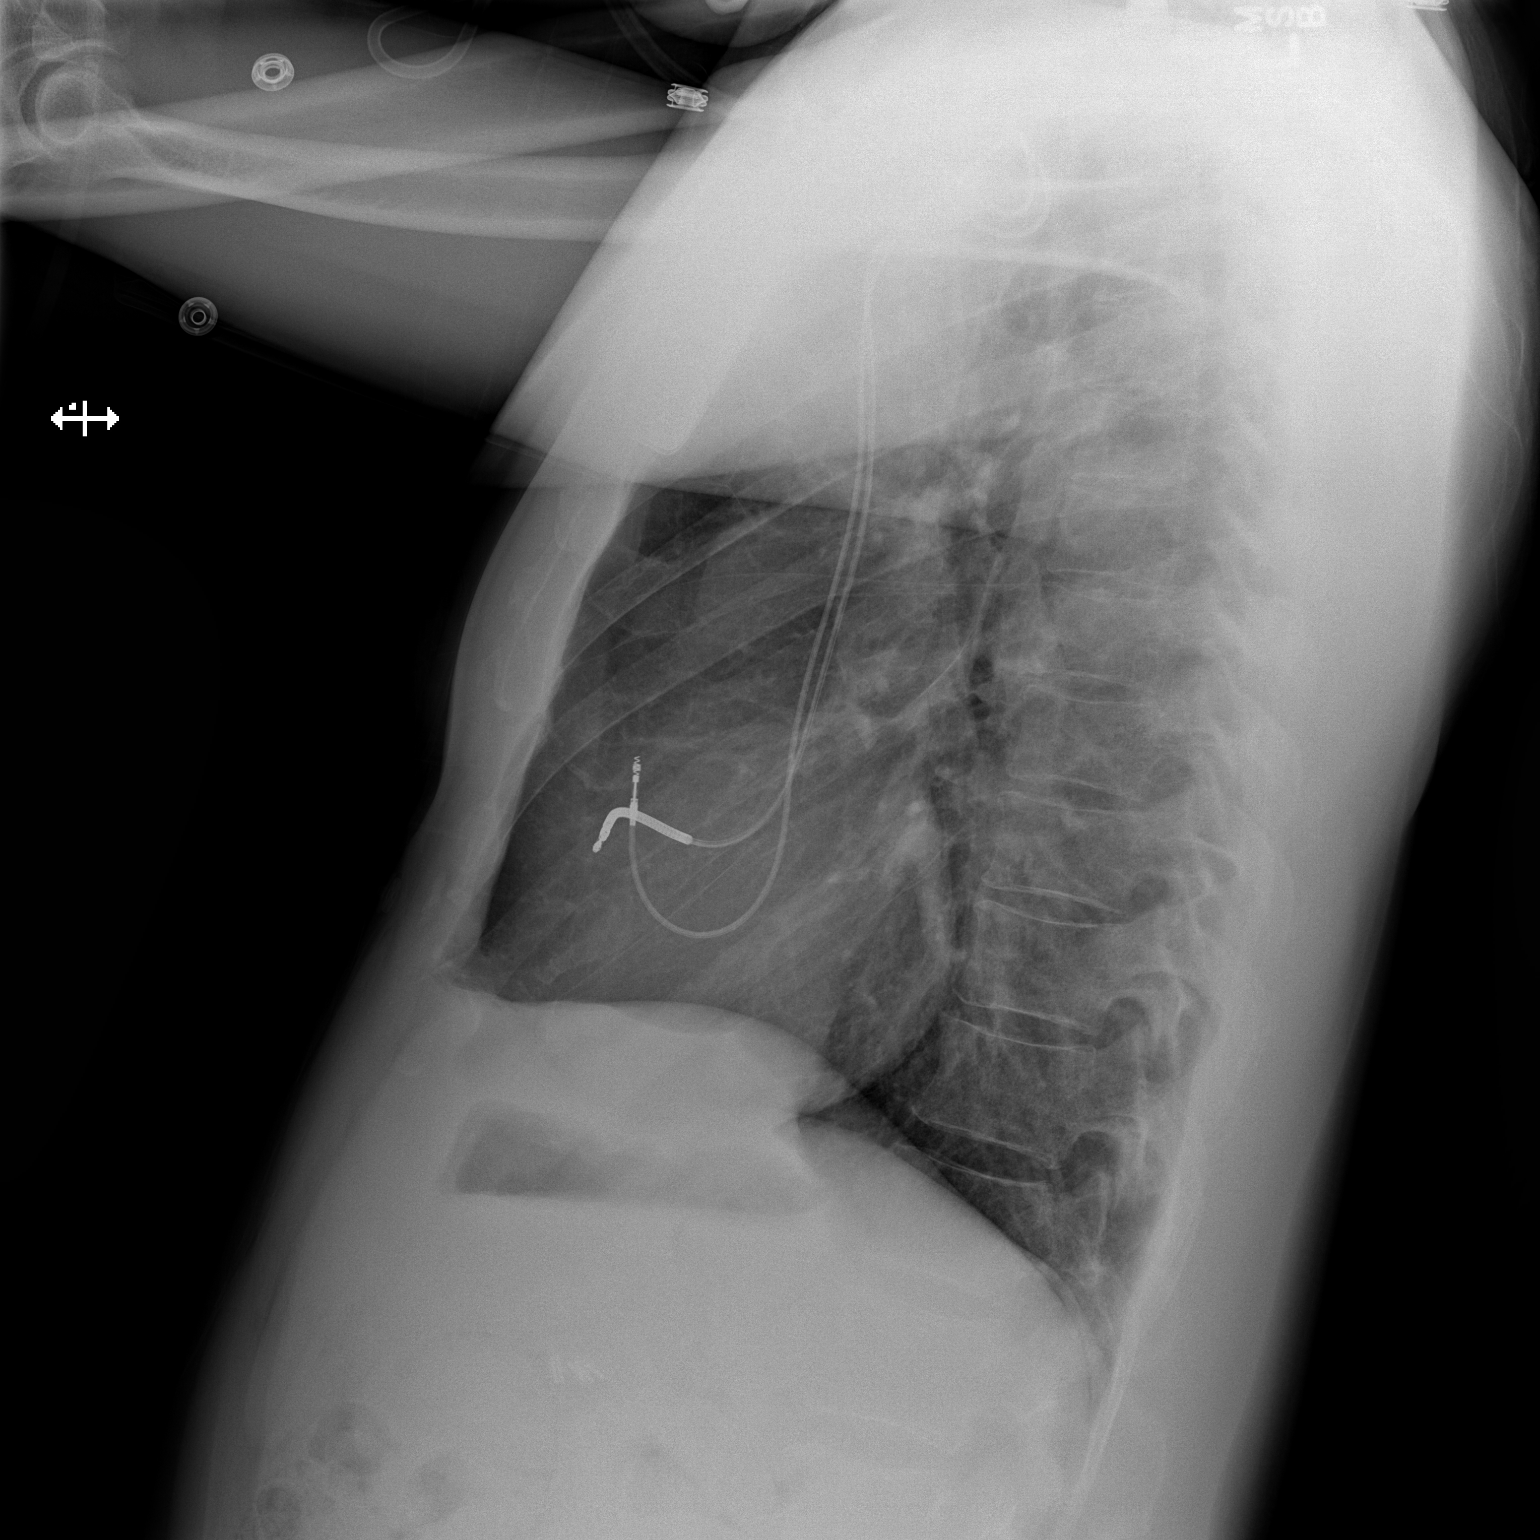

[2 of 2 positions shown; findings below may reference images not displayed]

FINDINGS: The lungs are well-aerated and clear.  There is no
evidence of focal opacification, pleural effusion or pneumothorax.

The heart is normal in size; the mediastinal contour is within
normal limits.  A pacemaker/AICD is noted at the left chest wall,
with leads ending at the right atrium and along the coronary sinus.
No acute osseous abnormalities are seen.  Clips are noted within
the right upper quadrant, reflecting prior cholecystectomy.
IMPRESSION: No acute cardiopulmonary process seen.

## 2011-08-04 LAB — GLUCOSE, CAPILLARY: Glucose-Capillary: 329 mg/dL — ABNORMAL HIGH (ref 70–99)

## 2012-04-06 ENCOUNTER — Emergency Department (HOSPITAL_COMMUNITY)
Admission: EM | Admit: 2012-04-06 | Discharge: 2012-04-06 | Disposition: A | Discharge status: Home / Self Care | Payer: Medicare Other | Attending: Emergency Medicine | Admitting: Emergency Medicine

## 2012-04-06 ENCOUNTER — Emergency Department (HOSPITAL_COMMUNITY): Payer: Medicare Other

## 2012-04-06 ENCOUNTER — Encounter (HOSPITAL_COMMUNITY): Payer: Self-pay

## 2012-04-06 DIAGNOSIS — R059 Cough, unspecified: Secondary | ICD-10-CM | POA: Insufficient documentation

## 2012-04-06 DIAGNOSIS — R42 Dizziness and giddiness: Secondary | ICD-10-CM | POA: Insufficient documentation

## 2012-04-06 DIAGNOSIS — Z95810 Presence of automatic (implantable) cardiac defibrillator: Secondary | ICD-10-CM | POA: Insufficient documentation

## 2012-04-06 DIAGNOSIS — R062 Wheezing: Secondary | ICD-10-CM | POA: Insufficient documentation

## 2012-04-06 DIAGNOSIS — I509 Heart failure, unspecified: Secondary | ICD-10-CM | POA: Insufficient documentation

## 2012-04-06 DIAGNOSIS — R739 Hyperglycemia, unspecified: Secondary | ICD-10-CM

## 2012-04-06 DIAGNOSIS — R05 Cough: Secondary | ICD-10-CM | POA: Insufficient documentation

## 2012-04-06 DIAGNOSIS — E119 Type 2 diabetes mellitus without complications: Secondary | ICD-10-CM | POA: Insufficient documentation

## 2012-04-06 LAB — PROTIME-INR
INR: 0.91 (ref 0.00–1.49)
Prothrombin Time: 12.5 seconds (ref 11.6–15.2)

## 2012-04-06 LAB — BASIC METABOLIC PANEL
BUN: 19 mg/dL (ref 6–23)
CO2: 26 mEq/L (ref 19–32)
Calcium: 9.7 mg/dL (ref 8.4–10.5)
Chloride: 94 mEq/L — ABNORMAL LOW (ref 96–112)
Creatinine, Ser: 0.85 mg/dL (ref 0.50–1.35)
GFR calc Af Amer: >90 mL/min (ref >90)
GFR calc non Af Amer: >90 mL/min (ref >90)
Glucose, Bld: 504 mg/dL — ABNORMAL HIGH (ref 70–99)
Potassium: 4.6 mEq/L (ref 3.5–5.1)
Sodium: 130 mEq/L — ABNORMAL LOW (ref 135–145)

## 2012-04-06 LAB — DIFFERENTIAL
Basophils Absolute: 0 10*3/uL (ref 0.0–0.1)
Basophils Relative: 0 % (ref 0–1)
Eosinophils Absolute: 0.2 10*3/uL (ref 0.0–0.7)
Eosinophils Relative: 2 % (ref 0–5)
Lymphocytes Relative: 14 % (ref 12–46)
Lymphs Abs: 1.5 10*3/uL (ref 0.7–4.0)
Monocytes Absolute: 0.6 10*3/uL (ref 0.1–1.0)
Monocytes Relative: 6 % (ref 3–12)
Neutro Abs: 8.3 10*3/uL — ABNORMAL HIGH (ref 1.7–7.7)
Neutrophils Relative %: 78 % — ABNORMAL HIGH (ref 43–77)

## 2012-04-06 LAB — CBC
HCT: 40.1 % (ref 39.0–52.0)
Hemoglobin: 13.2 g/dL (ref 13.0–17.0)
MCH: 30.6 pg (ref 26.0–34.0)
MCHC: 32.9 g/dL (ref 30.0–36.0)
MCV: 92.8 fL (ref 78.0–100.0)
Platelets: 277 10*3/uL (ref 150–400)
RBC: 4.32 MIL/uL (ref 4.22–5.81)
RDW: 12.3 % (ref 11.5–15.5)
WBC: 10.7 10*3/uL — ABNORMAL HIGH (ref 4.0–10.5)

## 2012-04-06 LAB — GLUCOSE, CAPILLARY
Glucose-Capillary: 420 mg/dL — ABNORMAL HIGH (ref 70–99)
Glucose-Capillary: 354 mg/dL — ABNORMAL HIGH (ref 70–99)

## 2012-04-06 LAB — TROPONIN I: Troponin I: <0.3 ng/mL (ref <0.30)

## 2012-04-06 IMAGING — CR DG CHEST 2V
2 series · 2 of 2 positions shown · non-contrast
Comparison: [DATE]

CLINICAL DATA: Productive cough for 3 weeks.  Fever.  Pain at site
of defibrillator.

CHEST - 2 VIEW

[w chest pa]
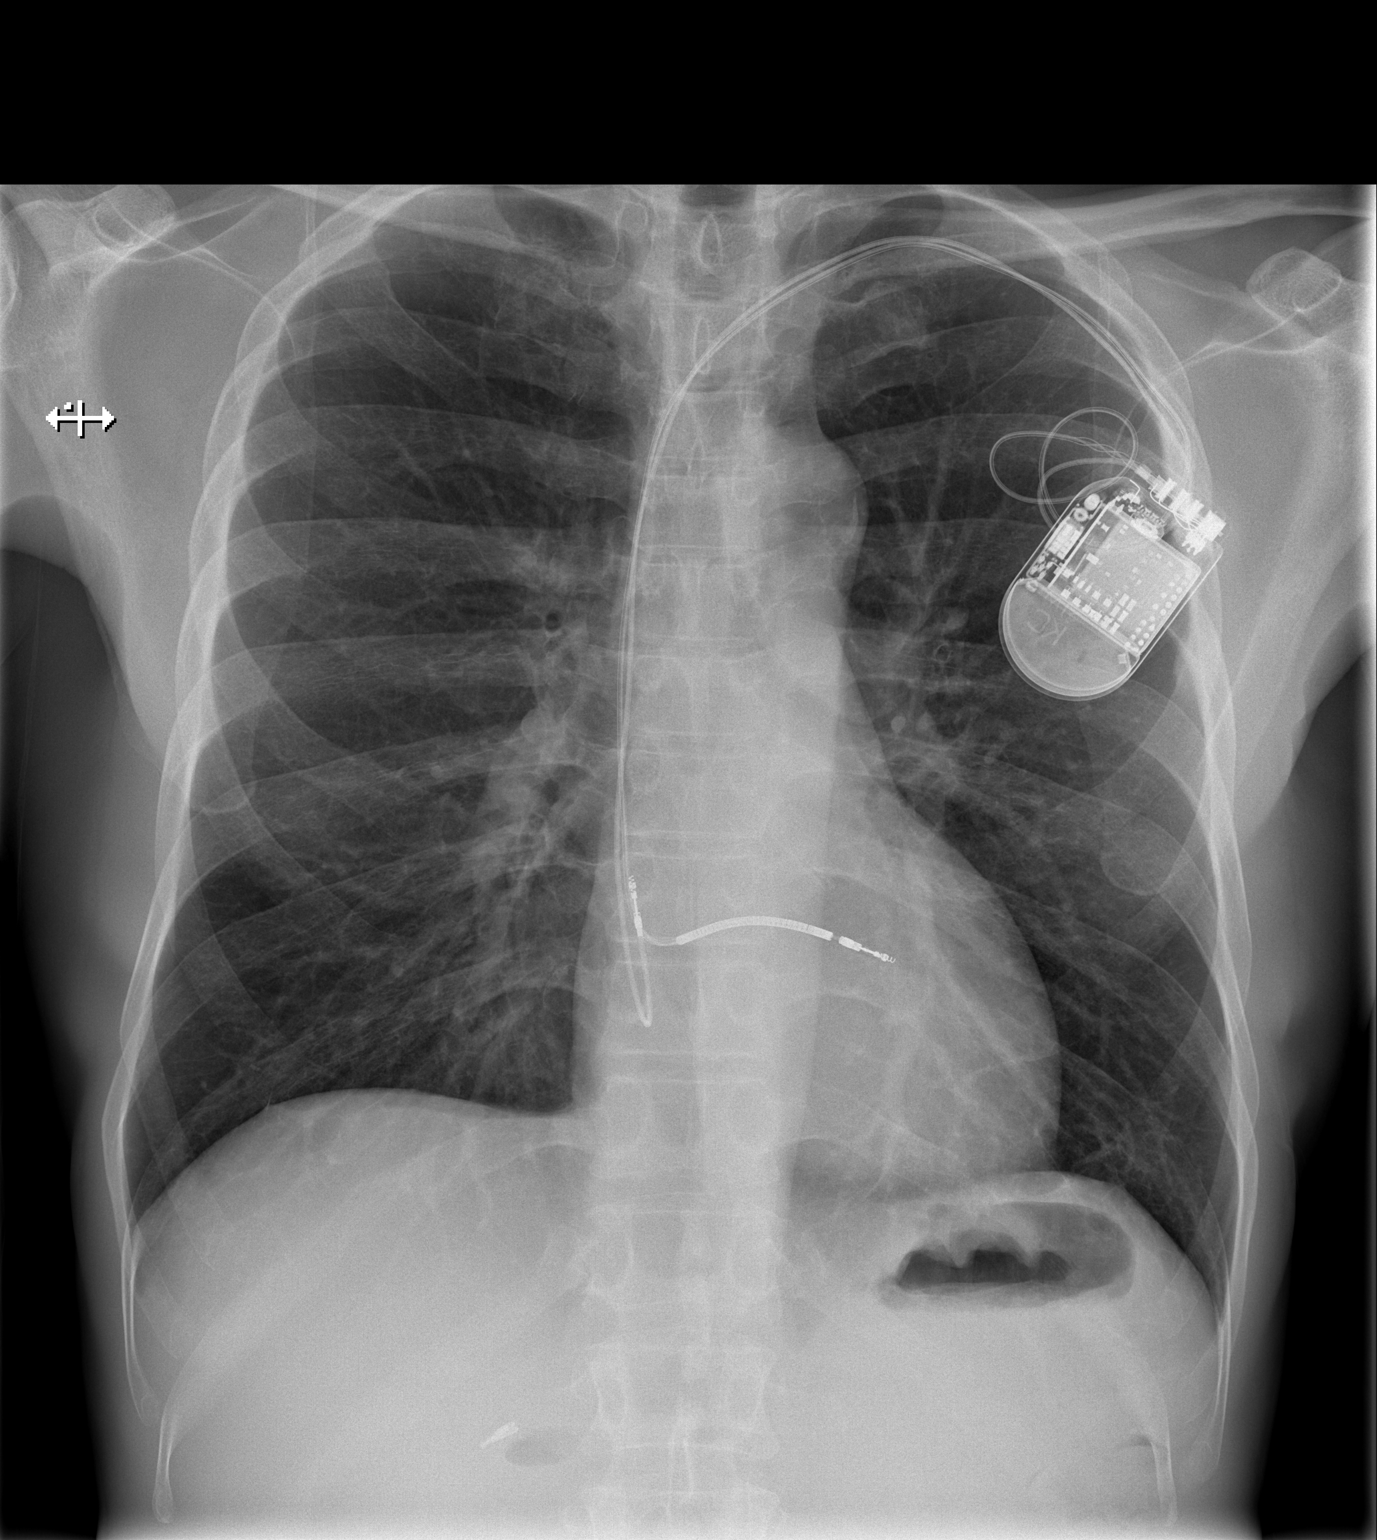

[w chest lat]
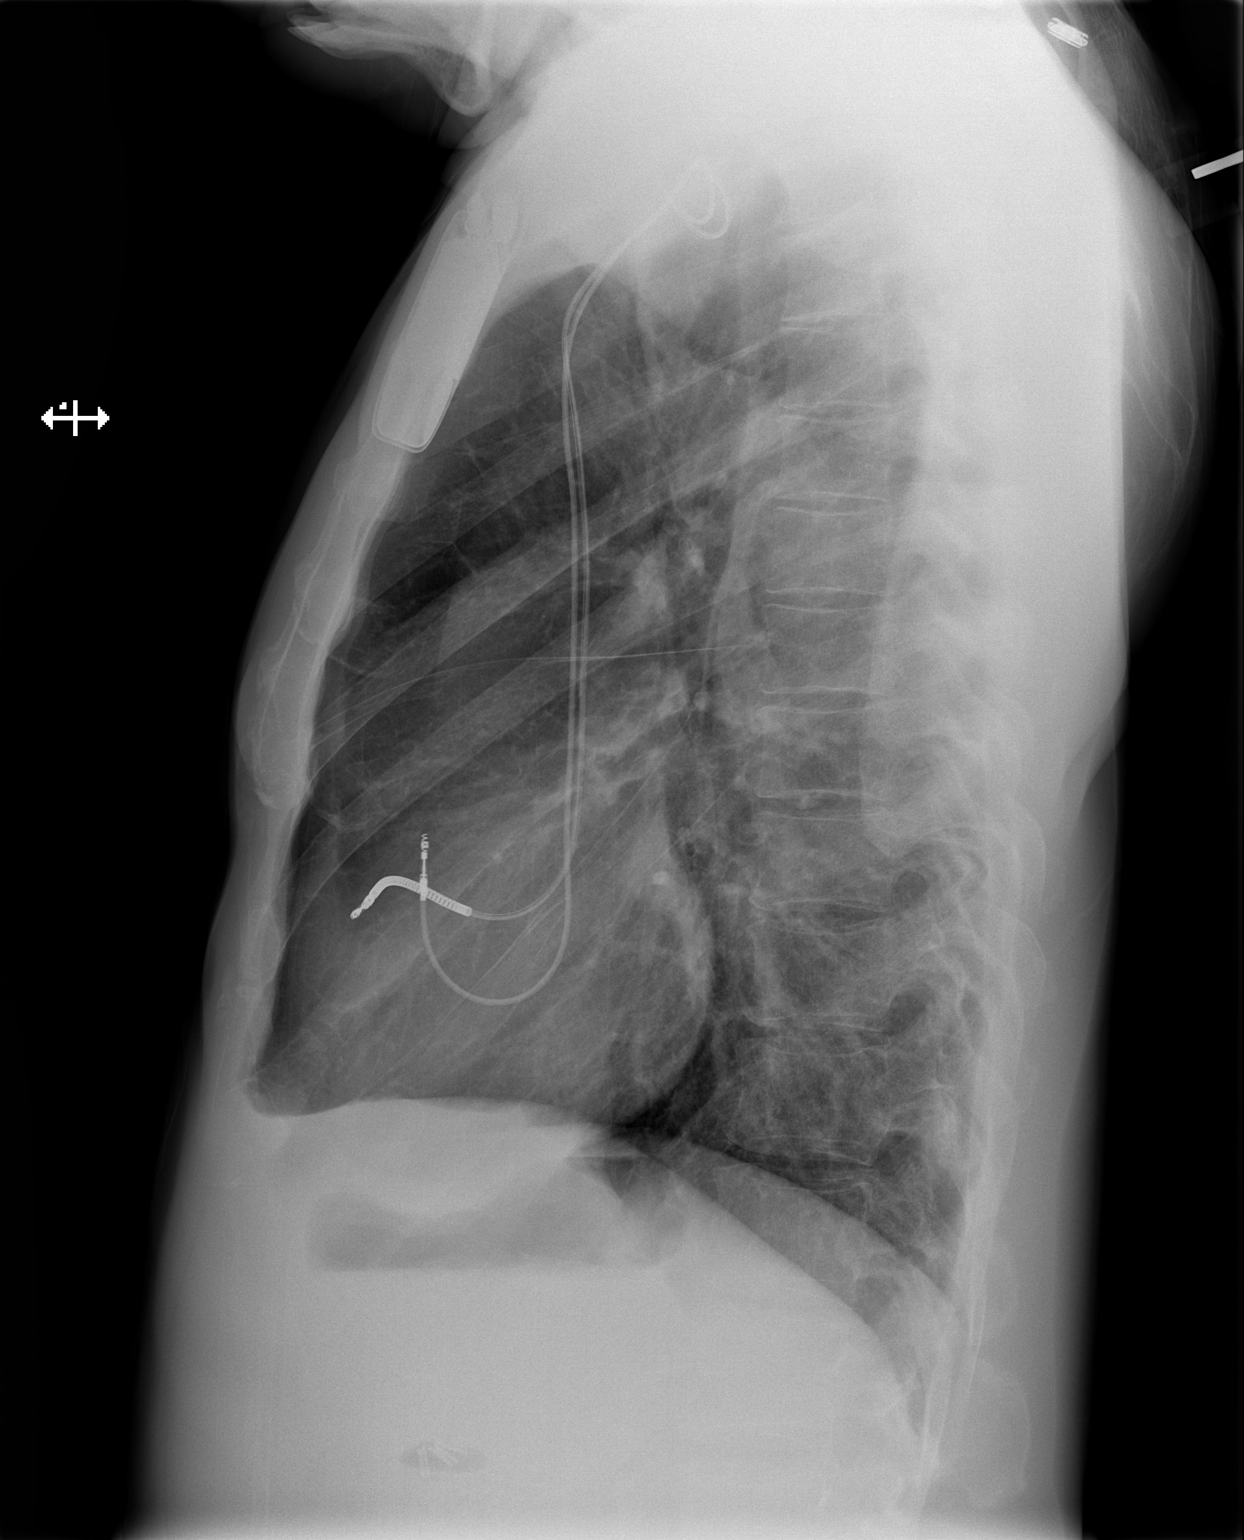

[2 of 2 positions shown; findings below may reference images not displayed]

FINDINGS: Pacer / AICD device is unchanged in position; right
atrium and right ventricle.  No lead discontinuity.

Midline trachea.  Normal heart size and mediastinal contours. No
pleural effusion or pneumothorax.  Clear lungs.

Cholecystectomy clips.
IMPRESSION: No acute cardiopulmonary disease.

## 2012-04-06 MED ORDER — SODIUM CHLORIDE 0.9 % IV BOLUS (SEPSIS)
500.0000 mL | Freq: Once | INTRAVENOUS | Status: AC
  Administered 2012-04-06: 500 mL via INTRAVENOUS

## 2012-04-06 NOTE — ED Notes (Signed)
Pt. C/o lightheadedness and dizziness. Stated "I had just dropped my girlfriend off and I felt a sharp pain around my defibrillator and became lightheaded and dizzy. It only lasted about 5-6 seconds". Reports that this has happened twice before arrival. Currently denies pain or dizziness. Reports feeling jittery and has vomitted 3-4 times in the past 3 weeks. Hx of CHF and diabetes. Last checked BG and took insulin one week ago. Stated "The number was too high, it just read high on the meter".  BG checked: 420. Informed provider.

## 2012-04-06 NOTE — ED Notes (Signed)
Pt. C/o lightheadness and dizziness.  Stated "I was driving and i suddenly felt lightheaded with a sharp pain around my defibrillator lasting 5-6 sec."  Hx of CHF and diabetes.

## 2012-04-06 NOTE — Discharge Instructions (Signed)
Hyperglycemia Hyperglycemia occurs when the glucose (sugar) in your blood is too high. Hyperglycemia can happen for many reasons, but it most often happens to people who do not know they have diabetes or are not managing their diabetes properly.  CAUSES  Whether you have diabetes or not, there are other causes of hyperglycemia. Hyperglycemia can occur when you have diabetes, but it can also occur in other situations that you might not be as aware of, such as: Diabetes  If you have diabetes and are having problems controlling your blood glucose, hyperglycemia could occur because of some of the following reasons:   Not following your meal plan.   Not taking your diabetes medications or not taking it properly.   Exercising less or doing less activity than you normally do.   Being sick.  Pre-diabetes  This cannot be ignored. Before people develop Type 2 diabetes, they almost always have "pre-diabetes." This is when your blood glucose levels are higher than normal, but not yet high enough to be diagnosed as diabetes. Research has shown that some long-term damage to the body, especially the heart and circulatory system, may already be occurring during pre-diabetes. If you take action to manage your blood glucose when you have pre-diabetes, you may delay or prevent Type 2 diabetes from developing.  Stress  If you have diabetes, you may be "diet" controlled or on oral medications or insulin to control your diabetes. However, you may find that your blood glucose is higher than usual in the hospital whether you have diabetes or not. This is often referred to as "stress hyperglycemia." Stress can elevate your blood glucose. This happens because of hormones put out by the body during times of stress. If stress has been the cause of your high blood glucose, it can be followed regularly by your caregiver. That way he/she can make sure your hyperglycemia does not continue to get worse or progress to diabetes.    Steroids  Steroids are medications that act on the infection fighting system (immune system) to block inflammation or infection. One side effect can be a rise in blood glucose. Most people can produce enough extra insulin to allow for this rise, but for those who cannot, steroids make blood glucose levels go even higher. It is not unusual for steroid treatments to "uncover" diabetes that is developing. It is not always possible to determine if the hyperglycemia will go away after the steroids are stopped. A special blood test called an A1c is sometimes done to determine if your blood glucose was elevated before the steroids were started.  SYMPTOMS  Thirsty.   Frequent urination.   Dry mouth.   Blurred vision.   Tired or fatigue.   Weakness.   Sleepy.   Tingling in feet or leg.  DIAGNOSIS  Diagnosis is made by monitoring blood glucose in one or all of the following ways:  A1c test. This is a chemical found in your blood.   Fingerstick blood glucose monitoring.   Laboratory results.  TREATMENT  First, knowing the cause of the hyperglycemia is important before the hyperglycemia can be treated. Treatment may include, but is not be limited to:  Education.   Change or adjustment in medications.   Change or adjustment in meal plan.   Treatment for an illness, infection, etc.   More frequent blood glucose monitoring.   Change in exercise plan.   Decreasing or stopping steroids.   Lifestyle changes.  HOME CARE INSTRUCTIONS   Test your blood glucose   as directed.   Exercise regularly. Your caregiver will give you instructions about exercise. Pre-diabetes or diabetes which comes on with stress is helped by exercising.   Eat wholesome, balanced meals. Eat often and at regular, fixed times. Your caregiver or nutritionist will give you a meal plan to guide your sugar intake.   Being at an ideal weight is important. If needed, losing as little as 10 to 15 pounds may help  improve blood glucose levels.  SEEK MEDICAL CARE IF:   You have questions about medicine, activity, or diet.   You continue to have symptoms (problems such as increased thirst, urination, or weight gain).  SEEK IMMEDIATE MEDICAL CARE IF:   You are vomiting or have diarrhea.   Your breath smells fruity.   You are breathing faster or slower.   You are very sleepy or incoherent.   You have numbness, tingling, or pain in your feet or hands.   You have chest pain.   Your symptoms get worse even though you have been following your caregiver's orders.   If you have any other questions or concerns.  Document Released: 03/25/2001 Document Revised: 09/18/2011 Document Reviewed: 05/21/2009 ExitCare Patient Information 2012 [employer].Diabetes, Frequently Asked Questions WHAT IS DIABETES? Most of the food we eat is turned into glucose (sugar). Our bodies use it for energy. The pancreas makes a hormone called insulin. It helps glucose get into the cells of our bodies. When you have diabetes, your body either does not make enough insulin or cannot use its own insulin as well as it should. This causes sugars to build up in your blood. WHAT ARE THE SYMPTOMS OF DIABETES?  Frequent urination.   Excessive thirst.   Unexplained weight loss.   Extreme hunger.   Blurred vision.   Tingling or numbness in hands or feet.   Feeling very tired much of the time.   Dry, itchy skin.   Sores that are slow to heal.   Yeast infections.  WHAT ARE THE TYPES OF DIABETES? Type 1 Diabetes   About 10% of affected people have this type.   Usually occurs before the age of 30.   Usually occurs in thin to normal weight people.  Type 2 Diabetes  About 90% of affected people have this type.   Usually occurs after the age of 40.   Usually occurs in overweight people.   More likely to have:   A family history of diabetes.   A history of diabetes during pregnancy (gestational diabetes).    High blood pressure.   High cholesterol and triglycerides.  Gestational Diabetes  Occurs in about 4% of pregnancies.   Usually goes away after the baby is born.   More likely to occur in women with:   Family history of diabetes.   Previous gestational diabetes.   Obese.   Over 25 years old.  WHAT IS PRE-DIABETES? Pre-diabetes means your blood glucose is higher than normal, but lower than the diabetes range. It also means you are at risk of getting type 2 diabetes and heart disease. If you are told you have pre-diabetes, have your blood glucose checked again in 1 to 2 years. WHAT IS THE TREATMENT FOR DIABETES? Treatment is aimed at keeping blood glucose near normal levels at all times. Learning how to manage this yourself is important in treating diabetes. Depending on the type of diabetes you have, your treatment will include one or more of the following:  Monitoring your blood glucose.   Meal   planning.   Exercise.   Oral medicine (pills) or insulin.  CAN DIABETES BE PREVENTED? With type 1 diabetes, prevention is more difficult, because the triggers that cause it are not yet known. With type 2 diabetes, prevention is more likely, with lifestyle changes:  Maintain a healthy weight.   Eat healthy.   Exercise.  IS THERE A CURE FOR DIABETES? No, there is no cure for diabetes. There is a lot of research going on that is looking for a cure, and progress is being made. Diabetes can be treated and controlled. People with diabetes can manage their diabetes and lead normal, active lives. SHOULD I BE TESTED FOR DIABETES? If you are at least 50 years old, you should be tested for diabetes. You should be tested again every 3 years. If you are 45 or older and overweight, you may want to get tested more often. If you are younger than 45, overweight, and have one or more of the following risk factors, you should be tested:  Family history of diabetes.   Inactive lifestyle.   High  blood pressure.  WHAT ARE SOME OTHER SOURCES FOR INFORMATION ON DIABETES? The following organizations may help in your search for more information on diabetes: National Diabetes Education Program (NDEP) Internet: http://www.ndep.nih.gov/resources American Diabetes Association Internet: http://www.diabetes.org  Juvenile Diabetes Foundation International Internet: http://www.jdf.org Document Released: 10/02/2003 Document Revised: 09/18/2011 Document Reviewed: 07/27/2009 ExitCare Patient Information 2012 [employer]. 

## 2012-04-06 NOTE — ED Provider Notes (Signed)
History     CSN: [id number]  Arrival date & time 04/06/12  0856   First MD Initiated Contact with Patient 04/06/12 [phone]      No chief complaint on file.   Patient is a 51 y.o. male presenting with cough and weakness. The history is provided by the patient and the spouse.  Cough This is a new problem. The current episode started more than 1 week ago. The problem occurs constantly. The problem has been gradually worsening. The cough is productive of sputum (the past several days). There has been no fever. Pertinent negatives include no chest pain, no chills, no headaches, no shortness of breath and no wheezing. He has tried nothing for the symptoms. He is a smoker. His past medical history is significant for pneumonia.  Weakness Primary symptoms do not include headaches, seizures, dizziness, fever, nausea or vomiting. The symptoms began 3 to 5 days ago. The symptoms are worsening. The neurological symptoms are diffuse. Context: patient states he feels "jittery" when his blood glucose gets to high to read on his home glucometer, which is what it has read the past several days.  Additional symptoms do not include weakness. Medical issues do not include cerebral vascular accident or hypertension.    Past Medical History  Diagnosis Date  . Diabetes mellitus     No past surgical history on file.  No family history on file.  History  Substance Use Topics  . Smoking status: Not on file  . Smokeless tobacco: Not on file  . Alcohol Use: Not on file      Review of Systems  Constitutional: Negative for fever, chills, diaphoresis, activity change and appetite change.  HENT: Negative for neck pain.   Respiratory: Positive for cough (productive the past several days). Negative for chest tightness, shortness of breath and wheezing.   Cardiovascular: Negative for chest pain, palpitations and leg swelling.  Gastrointestinal: Negative for nausea, vomiting, abdominal pain, diarrhea and  constipation.  Genitourinary: Positive for frequency. Negative for dysuria, flank pain and difficulty urinating.  Skin: Negative for rash and wound.  Neurological: Positive for light-headedness. Negative for dizziness, seizures, syncope, weakness, numbness and headaches.  All other systems reviewed and are negative.    Allergies  Review of patient's allergies indicates no known allergies.  Home Medications   Current Outpatient Rx  Name Route Sig Dispense Refill  . ASPIRIN 325 MG PO TABS Oral Take 325 mg by mouth daily.    Marland Kitchen CARVEDILOL 3.125 MG PO TABS Oral Take 3.125 mg by mouth 2 (two) times daily with a meal.    . INSULIN ASPART 100 UNIT/ML [place] SOLN Subcutaneous Inject 3 Units into the skin 3 (three) times daily before meals.    . INSULIN GLARGINE 100 UNIT/ML [place] SOLN Subcutaneous Inject 32 Units into the skin at bedtime.    Marland Kitchen LISINOPRIL 2.5 MG PO TABS Oral Take 2.5 mg by mouth daily.    Marland Kitchen SIMVASTATIN 80 MG PO TABS Oral Take 80 mg by mouth at bedtime.    . SPIRONOLACTONE 25 MG PO TABS Oral Take 12.5 mg by mouth daily.    . WARFARIN SODIUM 2 MG PO TABS Oral Take 2 mg by mouth daily. 2 mg in addition to 5 mg    . WARFARIN SODIUM 5 MG PO TABS Oral Take 5 mg by mouth daily. 5 mg in addition to 2 mg      There were no vitals taken for this visit.  Physical Exam  Nursing note  and vitals reviewed. Constitutional: He appears well-developed and well-nourished.  HENT:  Head: Normocephalic and atraumatic.  Right Ear: External ear normal.  Left Ear: External ear normal.  Nose: Nose normal.  Mouth/Throat: Oropharynx is clear and moist. No oropharyngeal exudate.  Eyes: Conjunctivae are normal. Pupils are equal, round, and reactive to light.  Neck: Normal range of motion. Neck supple.  Cardiovascular: Normal rate, regular rhythm, normal heart sounds and intact distal pulses.   Pulmonary/Chest: Effort normal. No respiratory distress. He has wheezes (mild wheezing bilaterally). He has no  rales. He exhibits no tenderness.       Defibrillator overlying left superior chest. No erythema or edema around site. Site not tender to palpation.  Abdominal: Soft. Bowel sounds are normal. He exhibits no distension and no mass. There is no tenderness. There is no rebound and no guarding.  Musculoskeletal: Normal range of motion. He exhibits no edema and no tenderness.  Neurological: He is alert.  Skin: Skin is warm and dry. No rash noted. No erythema. No pallor.  Psychiatric: He has a normal mood and affect. His behavior is normal. Judgment and thought content normal.    ED Course  Procedures (including critical care time)  Labs Reviewed  CBC - Abnormal; Notable for the following:    WBC 10.7 (*)     All other components within normal limits  DIFFERENTIAL - Abnormal; Notable for the following:    Neutrophils Relative 78 (*)     Neutro Abs 8.3 (*)     All other components within normal limits  BASIC METABOLIC PANEL - Abnormal; Notable for the following:    Sodium 130 (*)     Chloride 94 (*)     Glucose, Bld 504 (*)     All other components within normal limits  GLUCOSE, CAPILLARY - Abnormal; Notable for the following:    Glucose-Capillary 420 (*)     All other components within normal limits  GLUCOSE, CAPILLARY - Abnormal; Notable for the following:    Glucose-Capillary 354 (*)     All other components within normal limits  TROPONIN I  PROTIME-INR   Dg Chest 2 View  04/06/2012  *RADIOLOGY REPORT*  Clinical Data: Productive cough for 3 weeks.  Fever.  Pain at site of defibrillator.  CHEST - 2 VIEW  Comparison: 08/03/2011  Findings: Pacer / AICD device is unchanged in position; right atrium and right ventricle.  No lead discontinuity.  Midline trachea.  Normal heart size and mediastinal contours. No pleural effusion or pneumothorax.  Clear lungs.  Cholecystectomy clips.  IMPRESSION: No acute cardiopulmonary disease.  Original Report Authenticated By: Consuello Bossier, M.D.     1.  Hyperglycemia      MDM  51 yo M w/hx of defibrillator placement in June 2011 for CHF presents for cough of several weeks that has now become productive and pain with coughing at the site of his defibrillator. No chest pain and exam is not concerning for infection of the defibrillator site. Patient also states his blood glucose has been very high the past several days and he gets "jittery" when his glucose rises, which is how he feels today. Labs c/w hyperglycemia and gentle IVF provided with significant improvement of hyperglycemia and symptoms. CXR not concerning for pneumonia. Patient given return precautions, including worsening of signs or symptoms. Patient instructed to follow-up with primary care physician regarding diabetes management.          Clemetine Marker, MD 04/06/12 1530

## 2012-04-15 NOTE — ED Provider Notes (Signed)
I saw and evaluated the patient, reviewed the resident's note and I agree with the findings and plan except if noted.  50yM with cough. No respiratory distress. Hx of chf but clinically not at this time. Hyperglycemic without acidosis. CXR with no focal infiltrate. Doubt pe. Plan outpt fu. Return precautions discussed.  Raeford Razor, MD 04/15/12 [phone]

## 2012-08-05 ENCOUNTER — Emergency Department (HOSPITAL_BASED_OUTPATIENT_CLINIC_OR_DEPARTMENT_OTHER): Payer: Medicare Other

## 2012-08-05 ENCOUNTER — Encounter (HOSPITAL_BASED_OUTPATIENT_CLINIC_OR_DEPARTMENT_OTHER): Payer: Self-pay | Admitting: *Deleted

## 2012-08-05 ENCOUNTER — Emergency Department (HOSPITAL_BASED_OUTPATIENT_CLINIC_OR_DEPARTMENT_OTHER)
Admission: EM | Admit: 2012-08-05 | Discharge: 2012-08-06 | Disposition: A | Discharge status: Home / Self Care | Payer: Medicare Other | Attending: Emergency Medicine | Admitting: Emergency Medicine

## 2012-08-05 DIAGNOSIS — F1920 Other psychoactive substance dependence, uncomplicated: Secondary | ICD-10-CM | POA: Insufficient documentation

## 2012-08-05 DIAGNOSIS — E119 Type 2 diabetes mellitus without complications: Secondary | ICD-10-CM | POA: Insufficient documentation

## 2012-08-05 DIAGNOSIS — Z7982 Long term (current) use of aspirin: Secondary | ICD-10-CM | POA: Insufficient documentation

## 2012-08-05 DIAGNOSIS — R739 Hyperglycemia, unspecified: Secondary | ICD-10-CM

## 2012-08-05 DIAGNOSIS — R1013 Epigastric pain: Secondary | ICD-10-CM

## 2012-08-05 DIAGNOSIS — R7309 Other abnormal glucose: Secondary | ICD-10-CM | POA: Insufficient documentation

## 2012-08-05 DIAGNOSIS — F17200 Nicotine dependence, unspecified, uncomplicated: Secondary | ICD-10-CM | POA: Insufficient documentation

## 2012-08-05 DIAGNOSIS — Z79899 Other long term (current) drug therapy: Secondary | ICD-10-CM | POA: Insufficient documentation

## 2012-08-05 DIAGNOSIS — Z794 Long term (current) use of insulin: Secondary | ICD-10-CM | POA: Insufficient documentation

## 2012-08-05 DIAGNOSIS — I509 Heart failure, unspecified: Secondary | ICD-10-CM | POA: Insufficient documentation

## 2012-08-05 HISTORY — DX: Other psychoactive substance dependence, uncomplicated: F19.20

## 2012-08-05 HISTORY — DX: Heart failure, unspecified: I50.9

## 2012-08-05 LAB — CBC WITH DIFFERENTIAL/PLATELET
Basophils Absolute: 0 10*3/uL (ref 0.0–0.1)
Basophils Relative: 0 % (ref 0–1)
Eosinophils Absolute: 0.3 10*3/uL (ref 0.0–0.7)
Eosinophils Relative: 3 % (ref 0–5)
HCT: 41.5 % (ref 39.0–52.0)
Hemoglobin: 14 g/dL (ref 13.0–17.0)
Lymphocytes Relative: 25 % (ref 12–46)
Lymphs Abs: 2.8 10*3/uL (ref 0.7–4.0)
MCH: 30.6 pg (ref 26.0–34.0)
MCHC: 33.7 g/dL (ref 30.0–36.0)
MCV: 90.8 fL (ref 78.0–100.0)
Monocytes Absolute: 1.2 10*3/uL — ABNORMAL HIGH (ref 0.1–1.0)
Monocytes Relative: 11 % (ref 3–12)
Neutro Abs: 7.1 10*3/uL (ref 1.7–7.7)
Neutrophils Relative %: 62 % (ref 43–77)
Platelets: 203 10*3/uL (ref 150–400)
RBC: 4.57 MIL/uL (ref 4.22–5.81)
RDW: 11.6 % (ref 11.5–15.5)
WBC: 11.4 10*3/uL — ABNORMAL HIGH (ref 4.0–10.5)

## 2012-08-05 LAB — COMPREHENSIVE METABOLIC PANEL
ALT: 17 U/L (ref 0–53)
AST: 18 U/L (ref 0–37)
Albumin: 3.8 g/dL (ref 3.5–5.2)
Alkaline Phosphatase: 111 U/L (ref 39–117)
BUN: 17 mg/dL (ref 6–23)
CO2: 27 mEq/L (ref 19–32)
Calcium: 9.7 mg/dL (ref 8.4–10.5)
Chloride: 91 mEq/L — ABNORMAL LOW (ref 96–112)
Creatinine, Ser: 1 mg/dL (ref 0.50–1.35)
GFR calc Af Amer: >90 mL/min (ref >90)
GFR calc non Af Amer: 86 mL/min — ABNORMAL LOW (ref >90)
Glucose, Bld: 542 mg/dL — ABNORMAL HIGH (ref 70–99)
Potassium: 4.6 mEq/L (ref 3.5–5.1)
Sodium: 129 mEq/L — ABNORMAL LOW (ref 135–145)
Total Bilirubin: 0.4 mg/dL (ref 0.3–1.2)
Total Protein: 7.2 g/dL (ref 6.0–8.3)

## 2012-08-05 LAB — GLUCOSE, CAPILLARY
Glucose-Capillary: >600 mg/dL (ref 70–99)
Glucose-Capillary: 390 mg/dL — ABNORMAL HIGH (ref 70–99)
Glucose-Capillary: 363 mg/dL — ABNORMAL HIGH (ref 70–99)

## 2012-08-05 LAB — TROPONIN I: Troponin I: <0.3 ng/mL (ref <0.30)

## 2012-08-05 LAB — URINALYSIS, ROUTINE W REFLEX MICROSCOPIC
Bilirubin Urine: NEGATIVE
Glucose, UA: >1000 mg/dL — AB
Hgb urine dipstick: NEGATIVE
Ketones, ur: NEGATIVE mg/dL
Leukocytes, UA: NEGATIVE
Nitrite: NEGATIVE
Protein, ur: NEGATIVE mg/dL
Specific Gravity, Urine: 1.042 — ABNORMAL HIGH (ref 1.005–1.030)
Urobilinogen, UA: 0.2 mg/dL (ref 0.0–1.0)
pH: 6 (ref 5.0–8.0)

## 2012-08-05 LAB — PROTIME-INR
INR: 2.08 — ABNORMAL HIGH (ref 0.00–1.49)
Prothrombin Time: 22.5 seconds — ABNORMAL HIGH (ref 11.6–15.2)

## 2012-08-05 LAB — URINE MICROSCOPIC-ADD ON

## 2012-08-05 LAB — LIPASE, BLOOD: Lipase: 64 U/L — ABNORMAL HIGH (ref 11–59)

## 2012-08-05 IMAGING — CR DG CHEST 1V PORT
1 series · 1 of 1 positions shown · non-contrast
Comparison: Two-view chest [DATE].

CLINICAL DATA: Cells right 90 epigastric burning.

PORTABLE CHEST - 1 VIEW

[view not recorded]
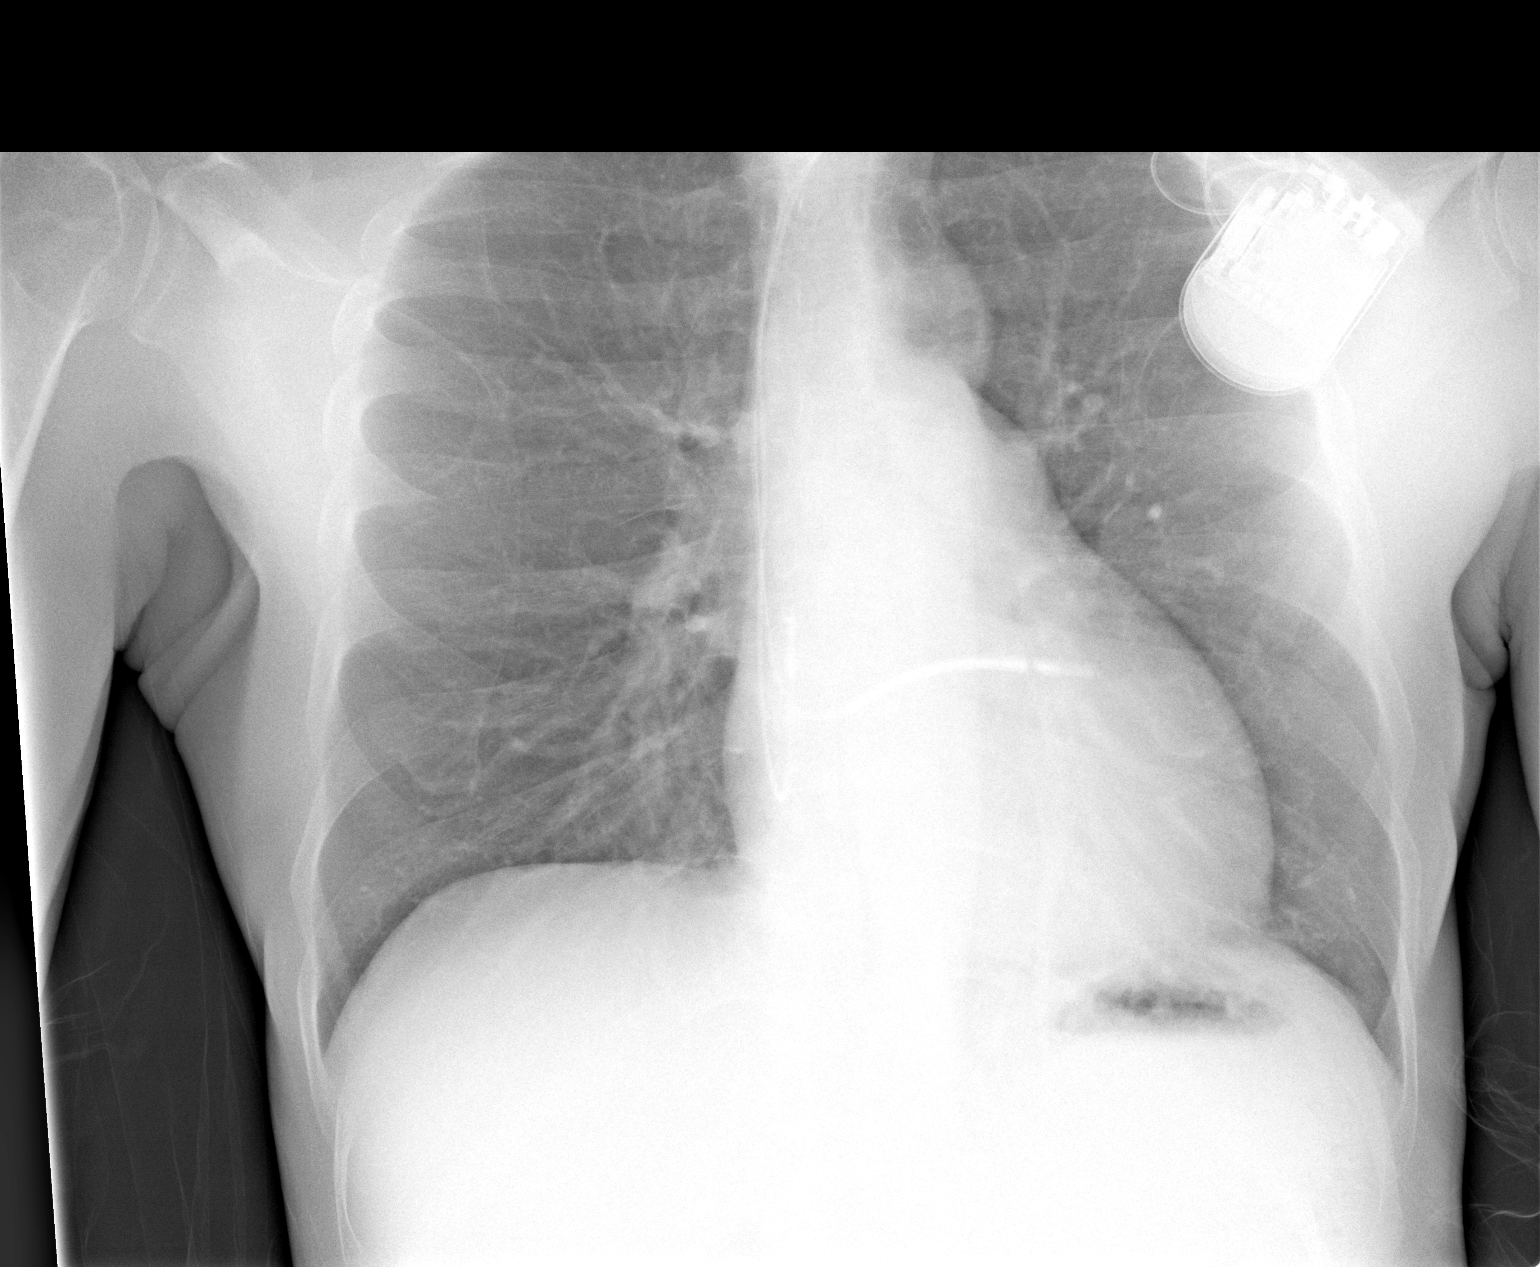

[1 of 1 positions shown; findings below may reference images not displayed]

FINDINGS: A dual lead pacemaker / defibrillator is stable.  The
heart size is exaggerated by low lung volumes.  No focal airspace
disease is evident.  The visualized soft tissues and bony thorax
are unremarkable.
IMPRESSION: 1.  Low lung volumes.
2.  No acute cardiopulmonary disease.

## 2012-08-05 MED ORDER — INSULIN REGULAR HUMAN 100 UNIT/ML IJ SOLN: 5.0000 [IU] | Freq: Once | INTRAMUSCULAR | Status: DC

## 2012-08-05 MED ORDER — FAMOTIDINE 20 MG PO TABS: 20.0000 mg | ORAL_TABLET | Freq: Two times a day (BID) | ORAL | Status: DC

## 2012-08-05 MED ORDER — INSULIN REGULAR HUMAN 100 UNIT/ML IJ SOLN
INTRAMUSCULAR | Status: AC
  Administered 2012-08-05: 10 [IU] via INTRAVENOUS
  Filled 2012-08-05: qty 10

## 2012-08-05 MED ORDER — ONDANSETRON HCL 4 MG/2ML IJ SOLN
4.0000 mg | Freq: Once | INTRAMUSCULAR | Status: AC
  Administered 2012-08-05: 4 mg via INTRAVENOUS
  Filled 2012-08-05: qty 2

## 2012-08-05 MED ORDER — INSULIN GLARGINE 100 UNIT/ML ~~LOC~~ SOLN: 32.0000 [IU] | Freq: Every day | SUBCUTANEOUS | Status: DC

## 2012-08-05 MED ORDER — SODIUM CHLORIDE 0.9 % IV BOLUS (SEPSIS)
1000.0000 mL | Freq: Once | INTRAVENOUS | Status: AC
  Administered 2012-08-05: 1000 mL via INTRAVENOUS

## 2012-08-05 MED ORDER — PANTOPRAZOLE SODIUM 40 MG IV SOLR
40.0000 mg | Freq: Once | INTRAVENOUS | Status: AC
  Administered 2012-08-05: 40 mg via INTRAVENOUS
  Filled 2012-08-05: qty 40

## 2012-08-05 MED ORDER — SODIUM CHLORIDE 0.9 % IV SOLN: INTRAVENOUS | Status: DC

## 2012-08-05 MED ORDER — HYDROMORPHONE HCL PF 1 MG/ML IJ SOLN
1.0000 mg | Freq: Once | INTRAMUSCULAR | Status: AC
  Administered 2012-08-05: 1 mg via INTRAVENOUS
  Filled 2012-08-05: qty 1

## 2012-08-05 MED ORDER — INSULIN REGULAR HUMAN 100 UNIT/ML IJ SOLN
INTRAMUSCULAR | Status: AC
  Filled 2012-08-05: qty 5

## 2012-08-05 MED ORDER — INSULIN REGULAR HUMAN 100 UNIT/ML IJ SOLN
10.0000 [IU] | Freq: Once | INTRAMUSCULAR | Status: AC
  Administered 2012-08-05: 10 [IU] via INTRAVENOUS

## 2012-08-05 NOTE — ED Notes (Signed)
From [facility] with c.o epigastric burning sensation. States his sugar was 533 15 minutes ago. He took Insulin 10 units before dinner.

## 2012-08-05 NOTE — ED Provider Notes (Signed)
History     CSN: [id number]  Arrival date & time 08/05/12  1927   First MD Initiated Contact with Patient 08/05/12 1945      Chief Complaint  Patient presents with  . Abdominal Pain    (Consider location/radiation/quality/duration/timing/severity/associated sxs/prior treatment) The history is provided by the patient.   the patient is a 51 year old male long-standing diabetes blood sugars are running in the 500s or greater for the past several days he has run out of his Lantus is continuing to take his NovoLog. Patient also with epigastric abdominal pain as a burning sensation that started early today. Nonradiating not associated with nausea vomiting or diarrhea the pain is described as a 7-8/10 is a burning sensation. No history of similar pain. Denies chest pain denies shortness of breath.  Past Medical History  Diagnosis Date  . Diabetes mellitus   . Drug abuse and dependence   . CHF (congestive heart failure)     Past Surgical History  Procedure Date  . Difibula     No family history on file.  History  Substance Use Topics  . Smoking status: Current Every Day Smoker -- 1.0 packs/day    Types: Cigarettes  . Smokeless tobacco: Not on file  . Alcohol Use: No      Review of Systems  Constitutional: Negative for fever and chills.  HENT: Negative for congestion.   Eyes: Negative for redness.  Respiratory: Negative for shortness of breath.   Cardiovascular: Negative for chest pain.  Gastrointestinal: Positive for abdominal pain. Negative for nausea, vomiting and diarrhea.  Genitourinary: Negative for dysuria.  Musculoskeletal: Negative for back pain.  Skin: Negative for rash.  Neurological: Negative for headaches.  Psychiatric/Behavioral: Negative for confusion.    Allergies  Review of patient's allergies indicates no known allergies.  Home Medications   Current Outpatient Rx  Name Route Sig Dispense Refill  . ASPIRIN 325 MG PO TABS Oral Take 325 mg by  mouth daily.    Marland Kitchen CARVEDILOL 3.125 MG PO TABS Oral Take 3.125 mg by mouth 2 (two) times daily with a meal.    . FAMOTIDINE 20 MG PO TABS Oral Take 1 tablet (20 mg total) by mouth 2 (two) times daily. 30 tablet 0  . INSULIN ASPART 100 UNIT/ML [place] SOLN Subcutaneous Inject 3 Units into the skin 3 (three) times daily before meals.    . INSULIN GLARGINE 100 UNIT/ML [place] SOLN Subcutaneous Inject 32 Units into the skin at bedtime.    . INSULIN GLARGINE 100 UNIT/ML [place] SOLN Subcutaneous Inject 32 Units into the skin at bedtime. 10 mL 6  . LISINOPRIL 2.5 MG PO TABS Oral Take 2.5 mg by mouth daily.    Marland Kitchen SIMVASTATIN 80 MG PO TABS Oral Take 80 mg by mouth at bedtime.    . SPIRONOLACTONE 25 MG PO TABS Oral Take 12.5 mg by mouth daily.    . WARFARIN SODIUM 2 MG PO TABS Oral Take 2 mg by mouth daily. 2 mg in addition to 5 mg    . WARFARIN SODIUM 5 MG PO TABS Oral Take 5 mg by mouth daily. 5 mg in addition to 2 mg      BP 124/70  Pulse 86  Temp 98.5 F (36.9 C) (Oral)  Resp 20  SpO2 98%  Physical Exam  Vitals reviewed. Constitutional: He is oriented to person, place, and time.  Cardiovascular: Normal rate, regular rhythm and normal heart sounds.   No murmur heard. Pulmonary/Chest: Effort normal and breath sounds  normal. No respiratory distress.  Abdominal: Soft. Bowel sounds are normal. There is no tenderness.  Musculoskeletal: Normal range of motion. He exhibits no edema and no tenderness.  Neurological: He is alert and oriented to person, place, and time. No cranial nerve deficit. He exhibits normal muscle tone. Coordination normal.  Skin: Skin is warm. No rash noted.    ED Course  Procedures (including critical care time)  Labs Reviewed  GLUCOSE, CAPILLARY - Abnormal; Notable for the following:    Glucose-Capillary >600 (*)     All other components within normal limits  LIPASE, BLOOD - Abnormal; Notable for the following:    Lipase 64 (*)     All other components within normal limits    COMPREHENSIVE METABOLIC PANEL - Abnormal; Notable for the following:    Sodium 129 (*)     Chloride 91 (*)     Glucose, Bld 542 (*)     GFR calc non Af Amer 86 (*)     All other components within normal limits  URINALYSIS, ROUTINE W REFLEX MICROSCOPIC - Abnormal; Notable for the following:    Specific Gravity, Urine 1.042 (*)     Glucose, UA >1000 (*)     All other components within normal limits  CBC WITH DIFFERENTIAL - Abnormal; Notable for the following:    WBC 11.4 (*)     Monocytes Absolute 1.2 (*)     All other components within normal limits  PROTIME-INR - Abnormal; Notable for the following:    Prothrombin Time 22.5 (*)     INR 2.08 (*)     All other components within normal limits  GLUCOSE, CAPILLARY - Abnormal; Notable for the following:    Glucose-Capillary 390 (*)     All other components within normal limits  GLUCOSE, CAPILLARY - Abnormal; Notable for the following:    Glucose-Capillary 363 (*)     All other components within normal limits  TROPONIN I  URINE MICROSCOPIC-ADD ON   Dg Chest Port 1 View  08/05/2012  *RADIOLOGY REPORT*  Clinical Data: Cells right 90 epigastric burning.  PORTABLE CHEST - 1 VIEW  Comparison: Two-view chest 04/06/2012.  Findings: A dual lead pacemaker / defibrillator is stable.  The heart size is exaggerated by low lung volumes.  No focal airspace disease is evident.  The visualized soft tissues and bony thorax are unremarkable.  IMPRESSION:  1.  Low lung volumes. 2.  No acute cardiopulmonary disease.   Original Report Authenticated By: Jamesetta Orleans. MATTERN, M.D.    Results for orders placed during the hospital encounter of 08/05/12  GLUCOSE, CAPILLARY      Component Value Range   Glucose-Capillary >600 (*) 70 - 99 mg/dL   Comment 1 Documented in Chart    LIPASE, BLOOD      Component Value Range   Lipase 64 (*) 11 - 59 U/L  COMPREHENSIVE METABOLIC PANEL      Component Value Range   Sodium 129 (*) 135 - 145 mEq/L   Potassium 4.6  3.5  - 5.1 mEq/L   Chloride 91 (*) 96 - 112 mEq/L   CO2 27  19 - 32 mEq/L   Glucose, Bld 542 (*) 70 - 99 mg/dL   BUN 17  6 - 23 mg/dL   Creatinine, Ser 1.61  0.50 - 1.35 mg/dL   Calcium 9.7  8.4 - 09.6 mg/dL   Total Protein 7.2  6.0 - 8.3 g/dL   Albumin 3.8  3.5 - 5.2 g/dL  AST 18  0 - 37 U/L   ALT 17  0 - 53 U/L   Alkaline Phosphatase 111  39 - 117 U/L   Total Bilirubin 0.4  0.3 - 1.2 mg/dL   GFR calc non Af Amer 86 (*) >90 mL/min   GFR calc Af Amer >90  >90 mL/min  URINALYSIS, ROUTINE W REFLEX MICROSCOPIC      Component Value Range   Color, Urine YELLOW  YELLOW   APPearance CLEAR  CLEAR   Specific Gravity, Urine 1.042 (*) 1.005 - 1.030   pH 6.0  5.0 - 8.0   Glucose, UA >1000 (*) NEGATIVE mg/dL   Hgb urine dipstick NEGATIVE  NEGATIVE   Bilirubin Urine NEGATIVE  NEGATIVE   Ketones, ur NEGATIVE  NEGATIVE mg/dL   Protein, ur NEGATIVE  NEGATIVE mg/dL   Urobilinogen, UA 0.2  0.0 - 1.0 mg/dL   Nitrite NEGATIVE  NEGATIVE   Leukocytes, UA NEGATIVE  NEGATIVE  CBC WITH DIFFERENTIAL      Component Value Range   WBC 11.4 (*) 4.0 - 10.5 K/uL   RBC 4.57  4.22 - 5.81 MIL/uL   Hemoglobin 14.0  13.0 - 17.0 g/dL   HCT 45.4  09.8 - 11.9 %   MCV 90.8  78.0 - 100.0 fL   MCH 30.6  26.0 - 34.0 pg   MCHC 33.7  30.0 - 36.0 g/dL   RDW 14.7  82.9 - 56.2 %   Platelets 203  150 - 400 K/uL   Neutrophils Relative 62  43 - 77 %   Neutro Abs 7.1  1.7 - 7.7 K/uL   Lymphocytes Relative 25  12 - 46 %   Lymphs Abs 2.8  0.7 - 4.0 K/uL   Monocytes Relative 11  3 - 12 %   Monocytes Absolute 1.2 (*) 0.1 - 1.0 K/uL   Eosinophils Relative 3  0 - 5 %   Eosinophils Absolute 0.3  0.0 - 0.7 K/uL   Basophils Relative 0  0 - 1 %   Basophils Absolute 0.0  0.0 - 0.1 K/uL  TROPONIN I      Component Value Range   Troponin I <0.30  <0.30 ng/mL  PROTIME-INR      Component Value Range   Prothrombin Time 22.5 (*) 11.6 - 15.2 seconds   INR 2.08 (*) 0.00 - 1.49  URINE MICROSCOPIC-ADD ON      Component Value Range    Squamous Epithelial / LPF RARE  RARE   Bacteria, UA RARE  RARE  GLUCOSE, CAPILLARY      Component Value Range   Glucose-Capillary 390 (*) 70 - 99 mg/dL  GLUCOSE, CAPILLARY      Component Value Range   Glucose-Capillary 363 (*) 70 - 99 mg/dL      Date: 13/05/6577  Rate: 82  Rhythm: normal sinus rhythm  QRS Axis: normal  Intervals: normal  ST/T Wave abnormalities: nonspecific ST/T changes  Conduction Disutrbances:none  Narrative Interpretation:   Old EKG Reviewed: changes noted 08/03/11 ? Anteroseptal infarct, noted in past but today more st elevation in V2-3.  CRITICAL CARE Performed by: Shelda Jakes.   Total critical care time: 30  Critical care time was exclusive of separately billable procedures and treating other patients.  Critical care was necessary to treat or prevent imminent or life-threatening deterioration.  Critical care was time spent personally by me on the following activities: development of treatment plan with patient and/or surrogate as well as nursing, discussions with consultants, evaluation of  patient's response to treatment, examination of patient, obtaining history from patient or surrogate, ordering and performing treatments and interventions, ordering and review of laboratory studies, ordering and review of radiographic studies, pulse oximetry and re-evaluation of patient's condition.    1. Hyperglycemia   2. Epigastric abdominal pain       MDM  Patient with poorly controlled diabetes improved here with a total of 15 units of subcutaneous insulin blood sugars now down to 300 range. No evidence of ketoacidosis. Some of pseudohyponatremia due to the high blood sugar. Patient's epigastric abdominal pain resolved with narcotics. Lipase slightly elevated could represent some mild pancreatitis but no acute abdominal process going on patient feels much better. Will treat with Pepcid we'll renew his Lantus [place] insulin that he's been noticing that he  takes 32 units at bedtime. He'll continue his NovoLog to followup with his regular doctor. Patient is taking his Coumadin his INR is therapeutic.  No evidence of any of atypical cardiac event EKG without any acute changes troponin is negative.  For patient treatment of his diabetes patient received 10 units of IV insulin push blood sugar was still in the 400 range received another 5 units of IV insulin push her sugar then came into the upper 300s. Patient also received 1 L of normal saline fluid and then IV rate of 100 cc per hour.      Shelda Jakes, MD 08/05/12 [phone]

## 2012-08-05 NOTE — ED Notes (Signed)
CBG reads "high" at triage.

## 2012-08-07 ENCOUNTER — Emergency Department (HOSPITAL_BASED_OUTPATIENT_CLINIC_OR_DEPARTMENT_OTHER)
Admission: EM | Admit: 2012-08-07 | Discharge: 2012-08-07 | Disposition: A | Discharge status: Home / Self Care | Payer: Medicare Other | Attending: Emergency Medicine | Admitting: Emergency Medicine

## 2012-08-07 ENCOUNTER — Emergency Department (HOSPITAL_BASED_OUTPATIENT_CLINIC_OR_DEPARTMENT_OTHER): Payer: Medicare Other

## 2012-08-07 ENCOUNTER — Encounter (HOSPITAL_BASED_OUTPATIENT_CLINIC_OR_DEPARTMENT_OTHER): Payer: Self-pay | Admitting: Emergency Medicine

## 2012-08-07 DIAGNOSIS — Z794 Long term (current) use of insulin: Secondary | ICD-10-CM | POA: Insufficient documentation

## 2012-08-07 DIAGNOSIS — E119 Type 2 diabetes mellitus without complications: Secondary | ICD-10-CM

## 2012-08-07 DIAGNOSIS — F17200 Nicotine dependence, unspecified, uncomplicated: Secondary | ICD-10-CM | POA: Insufficient documentation

## 2012-08-07 DIAGNOSIS — F1920 Other psychoactive substance dependence, uncomplicated: Secondary | ICD-10-CM | POA: Insufficient documentation

## 2012-08-07 DIAGNOSIS — Z7982 Long term (current) use of aspirin: Secondary | ICD-10-CM | POA: Insufficient documentation

## 2012-08-07 DIAGNOSIS — Z79899 Other long term (current) drug therapy: Secondary | ICD-10-CM | POA: Insufficient documentation

## 2012-08-07 DIAGNOSIS — I509 Heart failure, unspecified: Secondary | ICD-10-CM | POA: Insufficient documentation

## 2012-08-07 DIAGNOSIS — R1013 Epigastric pain: Secondary | ICD-10-CM | POA: Insufficient documentation

## 2012-08-07 LAB — URINALYSIS, ROUTINE W REFLEX MICROSCOPIC
Bilirubin Urine: NEGATIVE
Glucose, UA: >1000 mg/dL — AB
Hgb urine dipstick: NEGATIVE
Ketones, ur: NEGATIVE mg/dL
Leukocytes, UA: NEGATIVE
Nitrite: NEGATIVE
Protein, ur: NEGATIVE mg/dL
Specific Gravity, Urine: 1.041 — ABNORMAL HIGH (ref 1.005–1.030)
Urobilinogen, UA: 0.2 mg/dL (ref 0.0–1.0)
pH: 5 (ref 5.0–8.0)

## 2012-08-07 LAB — CBC WITH DIFFERENTIAL/PLATELET
Basophils Absolute: 0 10*3/uL (ref 0.0–0.1)
Basophils Relative: 0 % (ref 0–1)
Eosinophils Absolute: 0.3 10*3/uL (ref 0.0–0.7)
Eosinophils Relative: 2 % (ref 0–5)
HCT: 39.3 % (ref 39.0–52.0)
Hemoglobin: 13.1 g/dL (ref 13.0–17.0)
Lymphocytes Relative: 22 % (ref 12–46)
Lymphs Abs: 3.5 10*3/uL (ref 0.7–4.0)
MCH: 30.4 pg (ref 26.0–34.0)
MCHC: 33.3 g/dL (ref 30.0–36.0)
MCV: 91.2 fL (ref 78.0–100.0)
Monocytes Absolute: 1.2 10*3/uL — ABNORMAL HIGH (ref 0.1–1.0)
Monocytes Relative: 7 % (ref 3–12)
Neutro Abs: 11 10*3/uL — ABNORMAL HIGH (ref 1.7–7.7)
Neutrophils Relative %: 69 % (ref 43–77)
Platelets: 191 10*3/uL (ref 150–400)
RBC: 4.31 MIL/uL (ref 4.22–5.81)
RDW: 11.5 % (ref 11.5–15.5)
WBC: 16 10*3/uL — ABNORMAL HIGH (ref 4.0–10.5)

## 2012-08-07 LAB — COMPREHENSIVE METABOLIC PANEL
ALT: 22 U/L (ref 0–53)
AST: 21 U/L (ref 0–37)
Albumin: 3.7 g/dL (ref 3.5–5.2)
Alkaline Phosphatase: 96 U/L (ref 39–117)
BUN: 12 mg/dL (ref 6–23)
CO2: 28 mEq/L (ref 19–32)
Calcium: 9.6 mg/dL (ref 8.4–10.5)
Chloride: 98 mEq/L (ref 96–112)
Creatinine, Ser: 0.9 mg/dL (ref 0.50–1.35)
GFR calc Af Amer: >90 mL/min (ref >90)
GFR calc non Af Amer: >90 mL/min (ref >90)
Glucose, Bld: 414 mg/dL — ABNORMAL HIGH (ref 70–99)
Potassium: 4.1 mEq/L (ref 3.5–5.1)
Sodium: 134 mEq/L — ABNORMAL LOW (ref 135–145)
Total Bilirubin: 0.5 mg/dL (ref 0.3–1.2)
Total Protein: 6.9 g/dL (ref 6.0–8.3)

## 2012-08-07 LAB — LIPASE, BLOOD: Lipase: 46 U/L (ref 11–59)

## 2012-08-07 LAB — URINE MICROSCOPIC-ADD ON

## 2012-08-07 LAB — GLUCOSE, CAPILLARY
Glucose-Capillary: 237 mg/dL — ABNORMAL HIGH (ref 70–99)
Glucose-Capillary: 415 mg/dL — ABNORMAL HIGH (ref 70–99)

## 2012-08-07 LAB — PROTIME-INR
INR: 2.08 — ABNORMAL HIGH (ref 0.00–1.49)
Prothrombin Time: 22.5 seconds — ABNORMAL HIGH (ref 11.6–15.2)

## 2012-08-07 LAB — TROPONIN I: Troponin I: <0.3 ng/mL (ref <0.30)

## 2012-08-07 IMAGING — CR DG CHEST 2V
2 series · 2 of 2 positions shown · non-contrast
Comparison: [DATE]

CLINICAL DATA: Epigastric pain and elevated white cell count.

CHEST - 2 VIEW

[w chest pa]
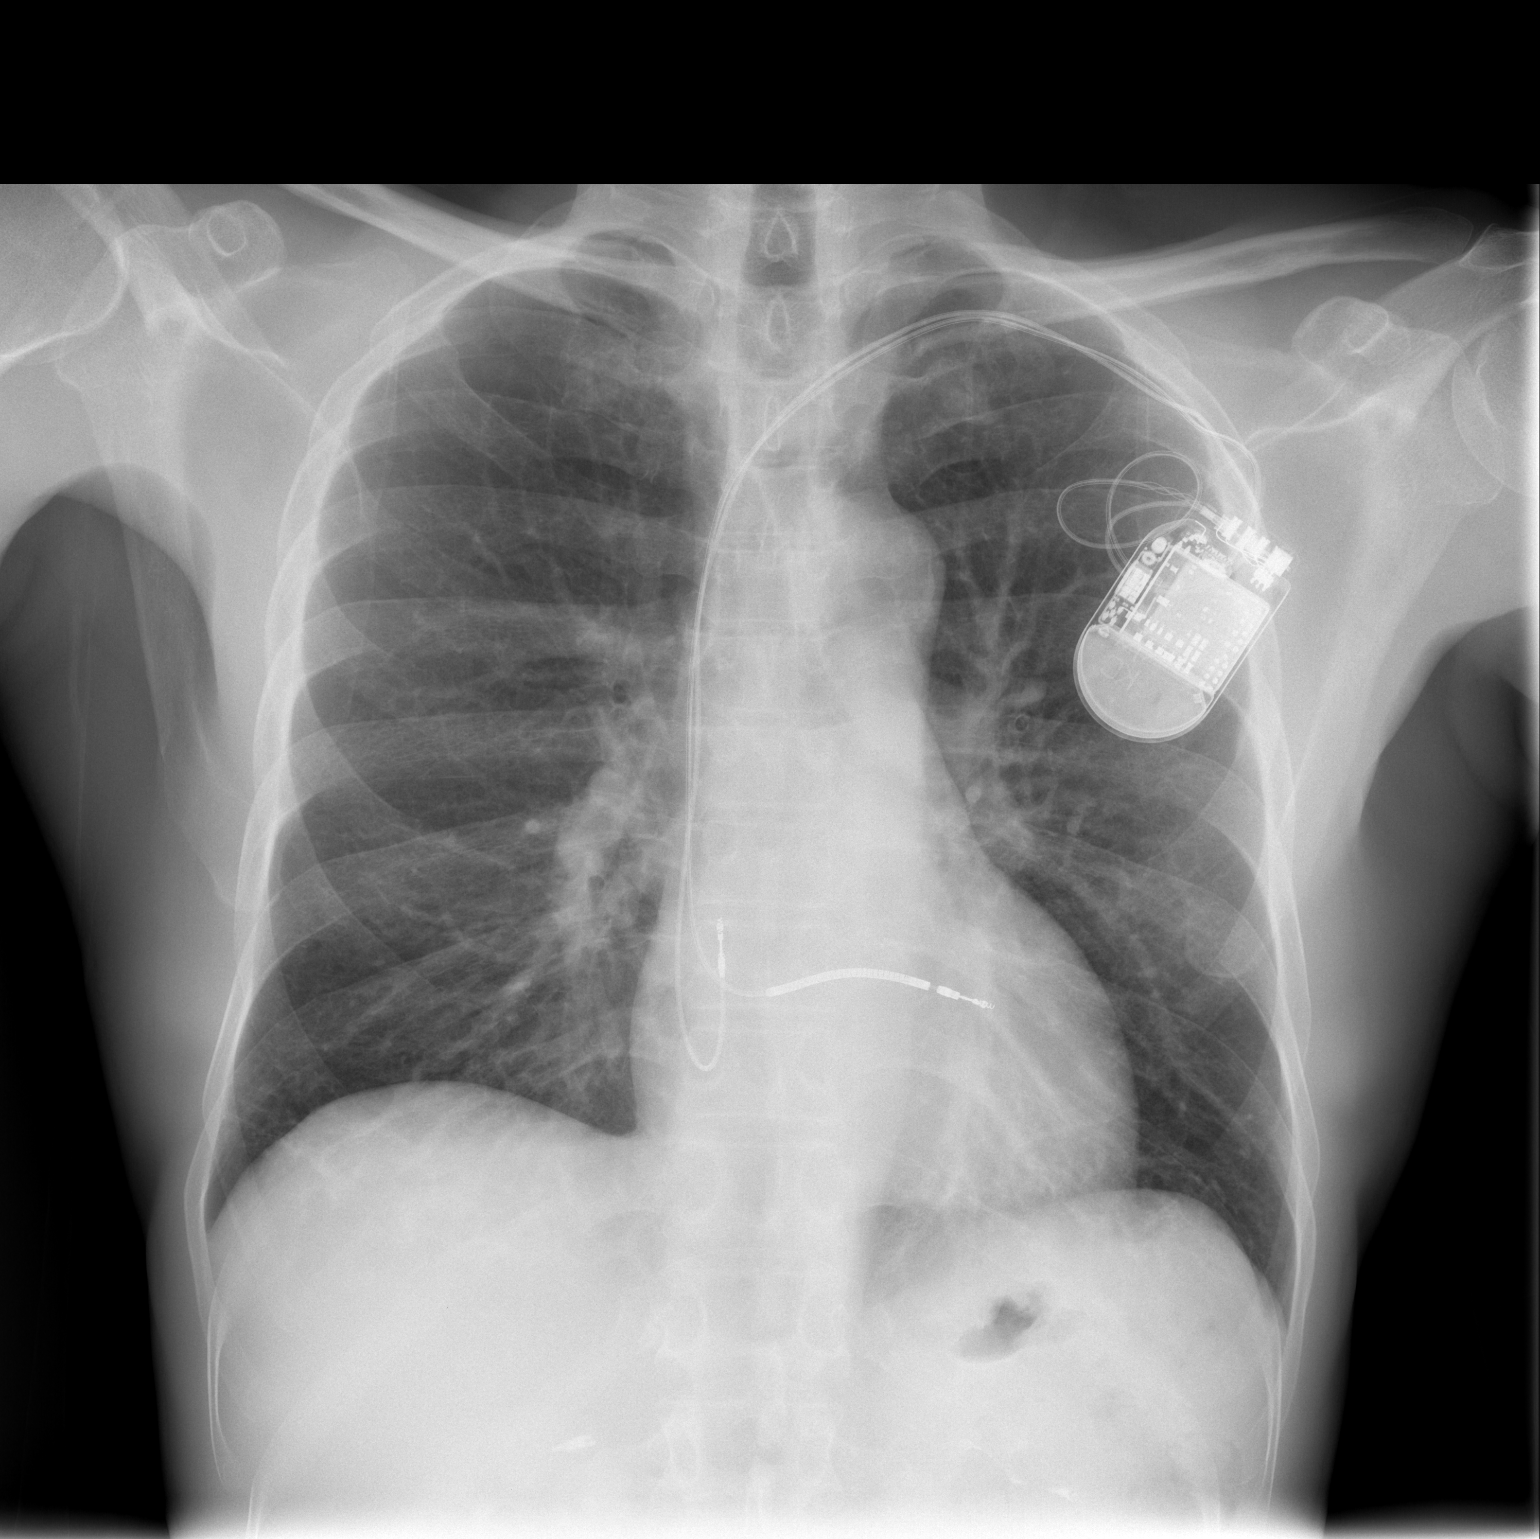

[w chest lat]
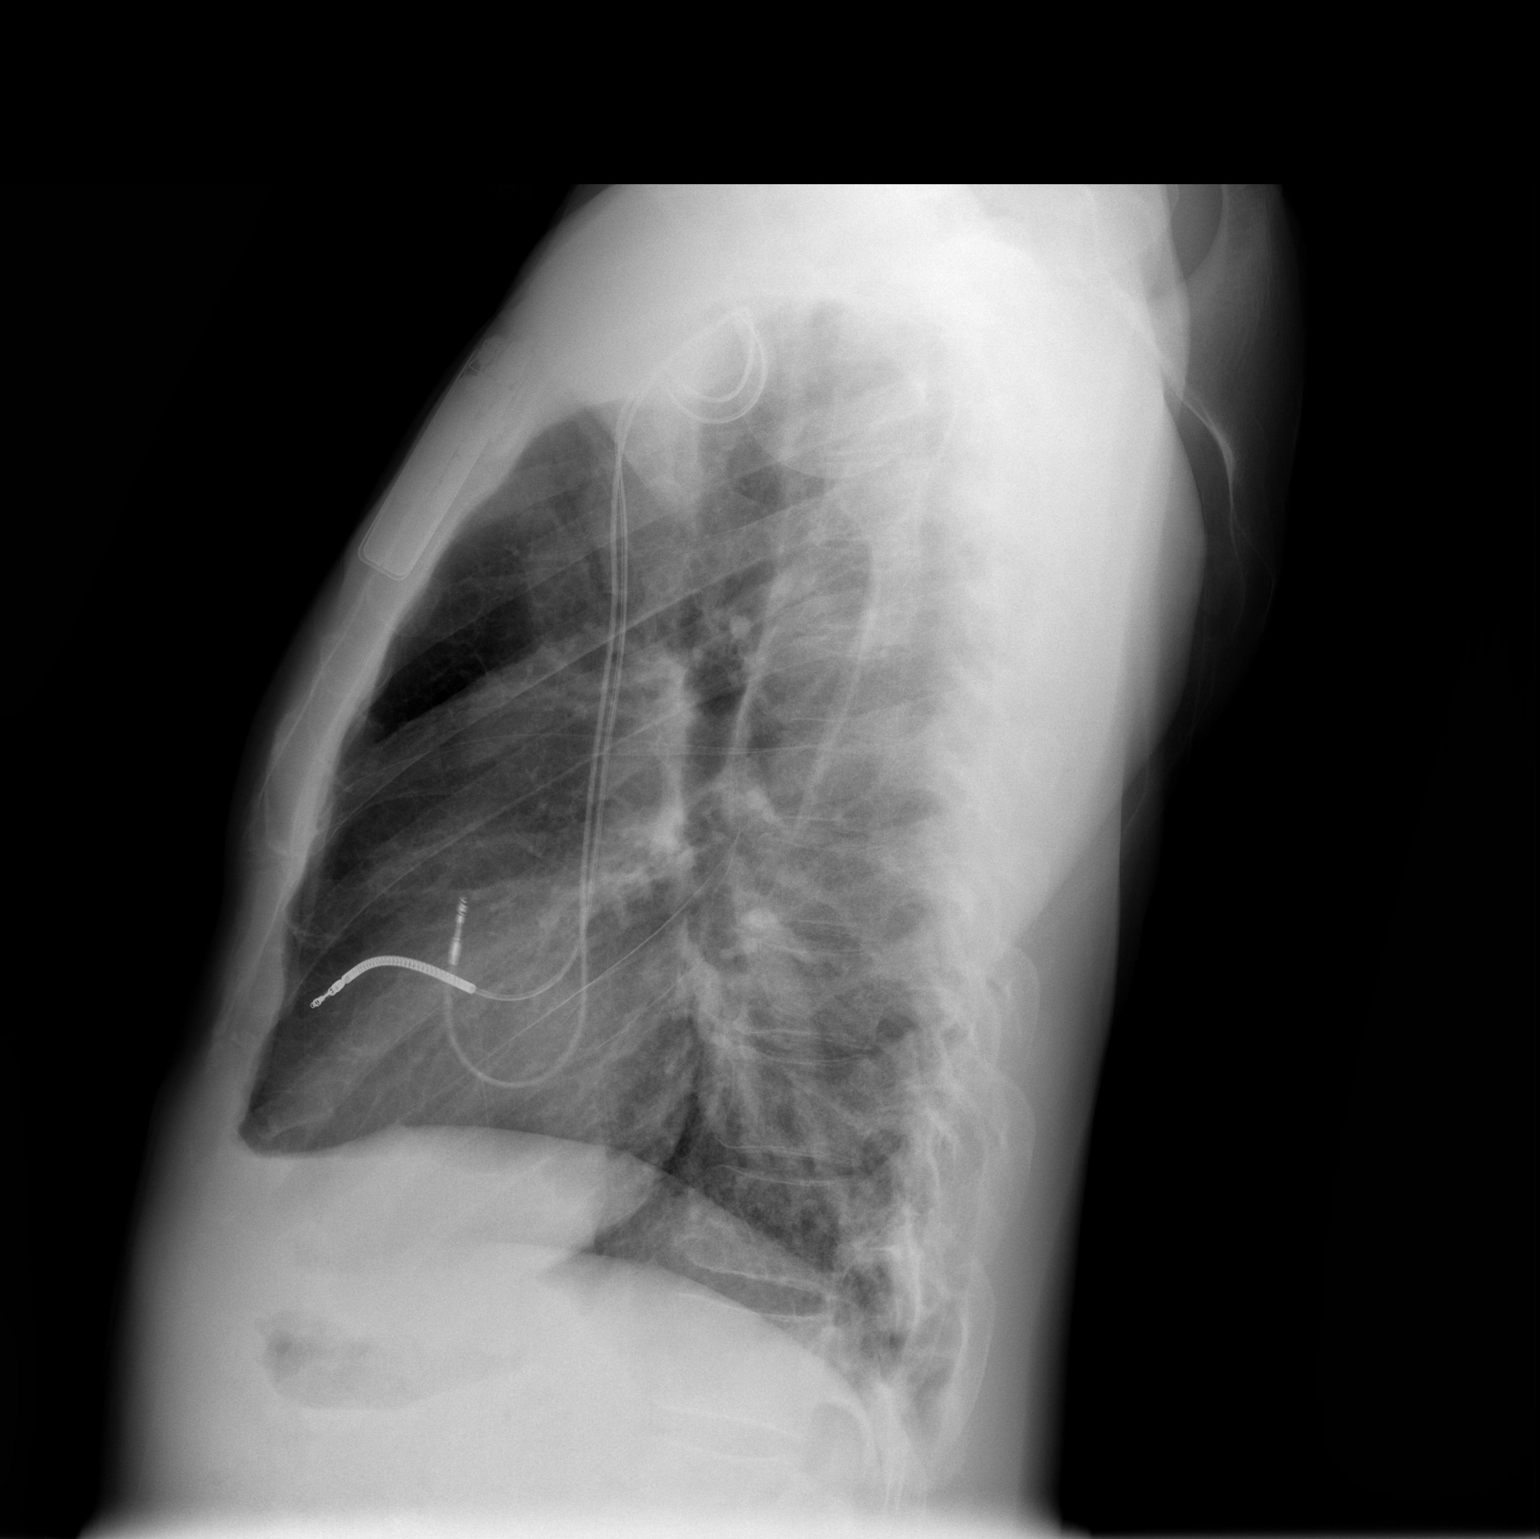

[2 of 2 positions shown; findings below may reference images not displayed]

FINDINGS: Stable appearance of cardiac pacemaker. The heart size
and pulmonary vascularity are normal. The lungs appear clear and
expanded without focal air space disease or consolidation. No
blunting of the costophrenic angles.  No pneumothorax.  Mediastinal
contours appear intact.  No significant change since previous
study.
IMPRESSION: No evidence of active pulmonary disease.

## 2012-08-07 IMAGING — CT CT ABD-PELV W/ CM
2 of 5 series · 16 of 46 positions shown, 18 images · IV contrast (APPLIED)
Comparison: [DATE]

CLINICAL DATA: Epigastric pain.  Elevated blood sugar.  White cell
count 16.

CT ABDOMEN AND PELVIS WITH CONTRAST
TECHNIQUE: Multidetector CT imaging of the abdomen and pelvis was
performed following the standard protocol during bolus
administration of intravenous contrast.
Contrast: 100mL OMNIPAQUE IOHEXOL 300 MG/ML  SOLN

[Series 2: abd/pelvis 5.0 b31f · axial · 0.69mm/px · z∈[-503,-78]mm · 13 of 96 slices shown, 15 images]
[im 6/96  soft-tissue]
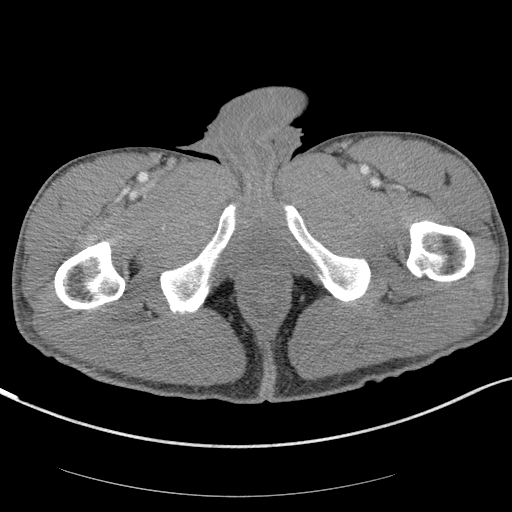
[im 6/96  bone]
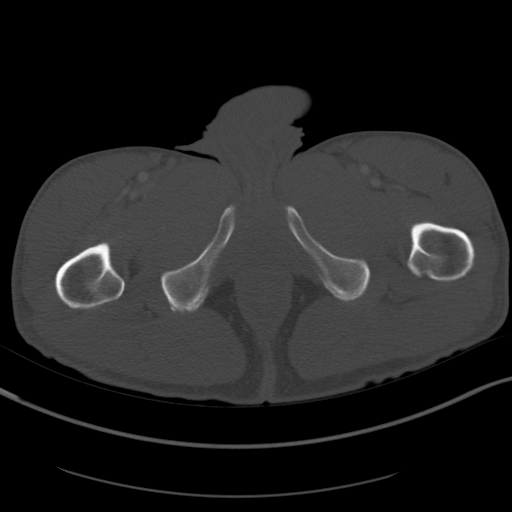
[im 16/96  soft-tissue]
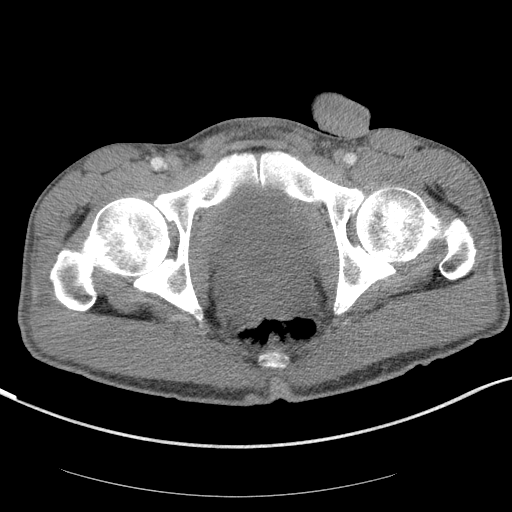
[im 21/96  soft-tissue]
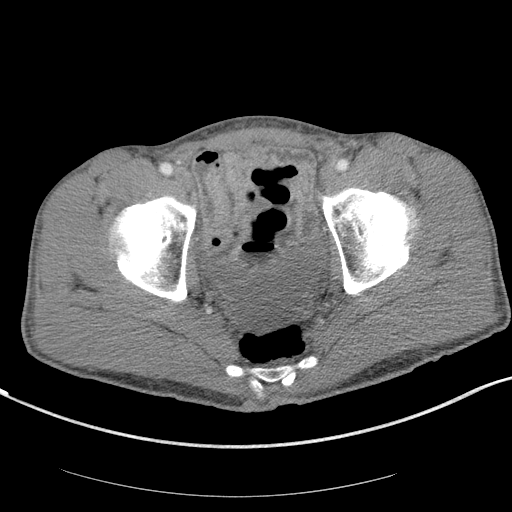
[im 26/96  soft-tissue]
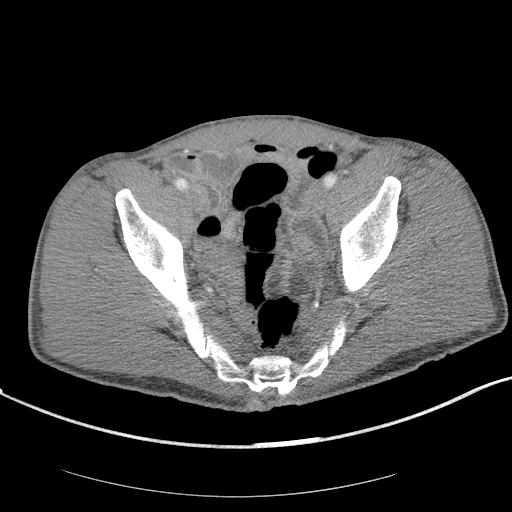
[im 36/96  soft-tissue]
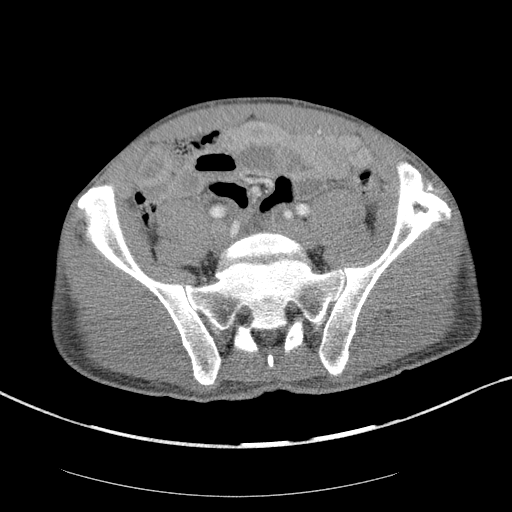
[im 41/96  soft-tissue]
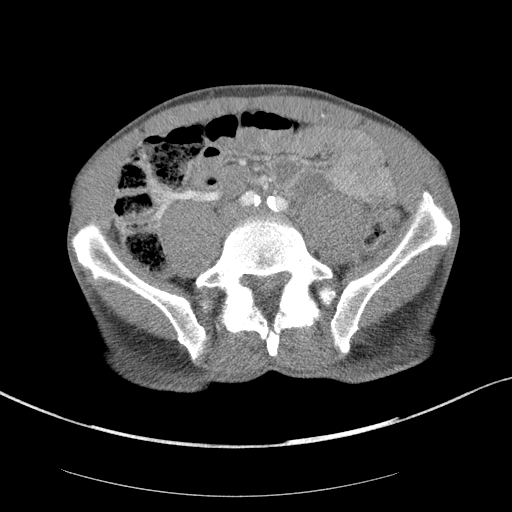
[im 51/96  soft-tissue]
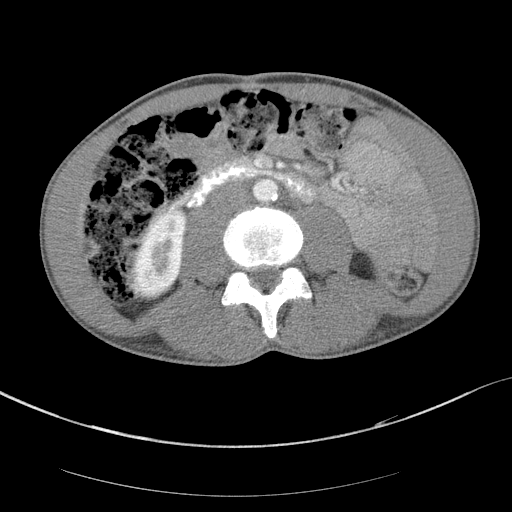
[im 56/96  soft-tissue]
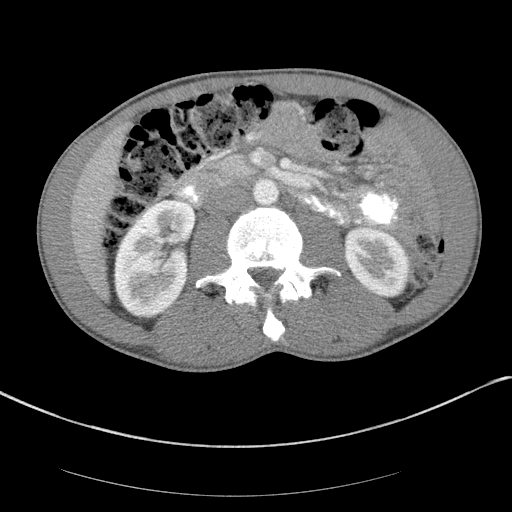
[im 61/96  soft-tissue]
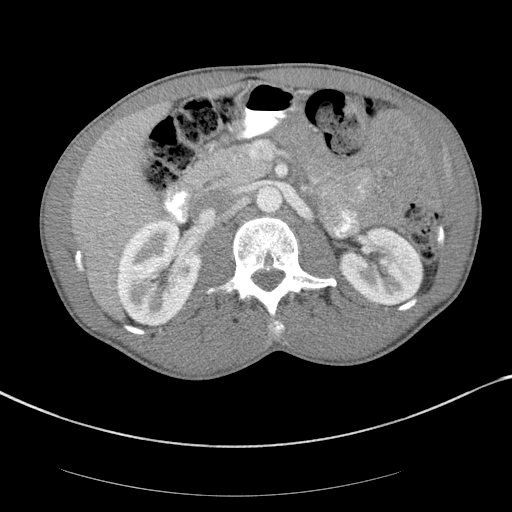
[im 61/96  bone]
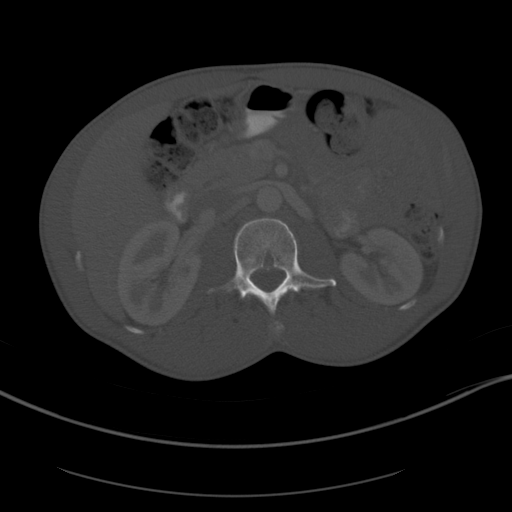
[im 71/96  soft-tissue]
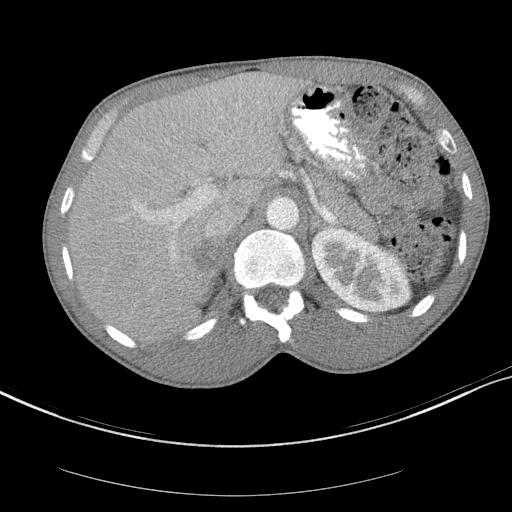
[im 76/96  soft-tissue]
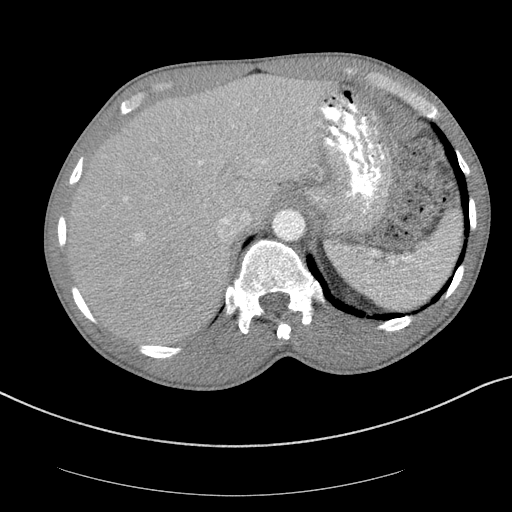
[im 81/96  soft-tissue]
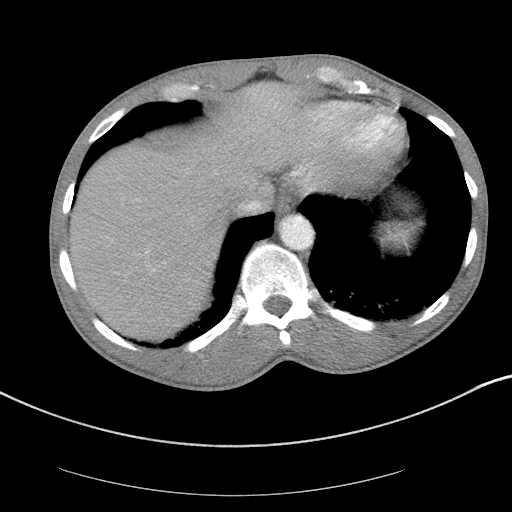
[im 91/96  soft-tissue]
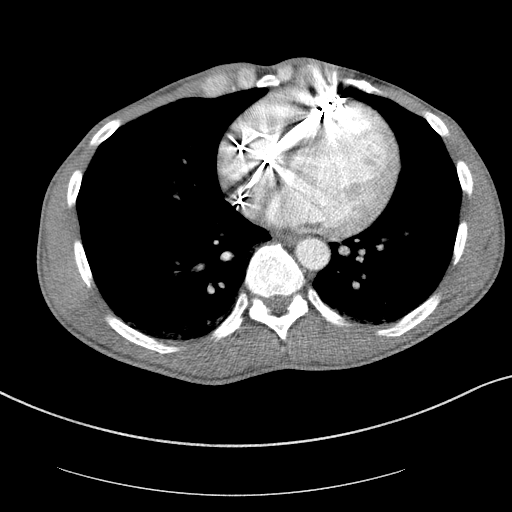

[Series 5: abd/pelvis 3.0 coronal · coronal · 0.74mm/px · 3 of 71 slices shown]
[im 24/71  soft-tissue]
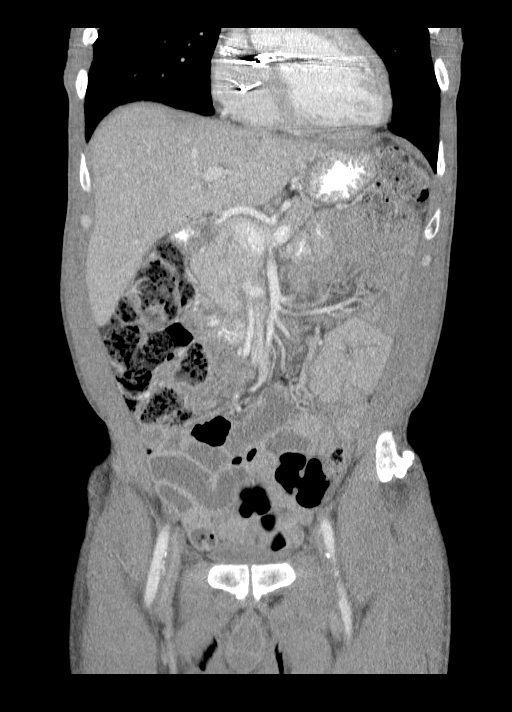
[im 32/71  soft-tissue]
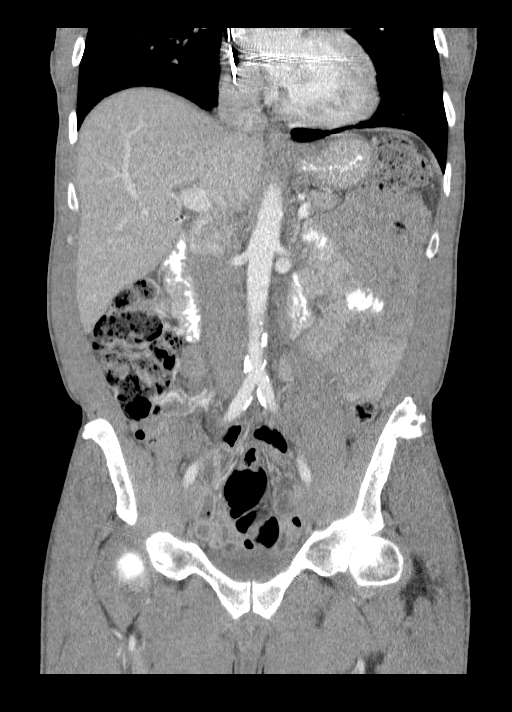
[im 39/71  soft-tissue]
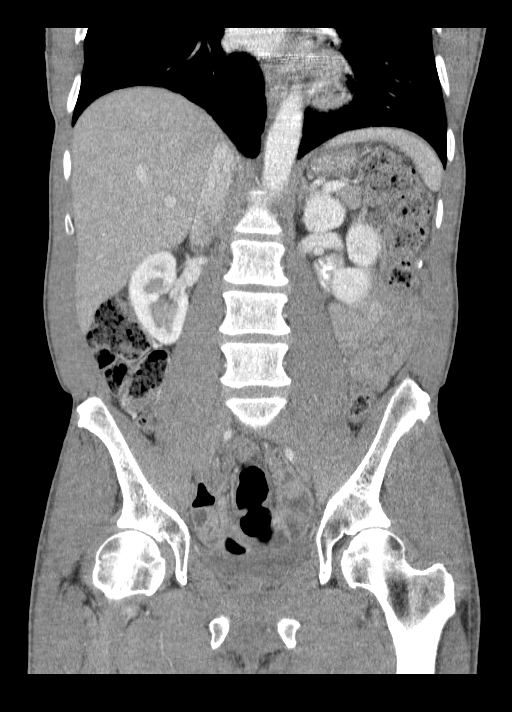

[16 of 46 positions shown; findings below may reference images not displayed]

FINDINGS: Dependent atelectasis in the lung bases.

Surgical absence of the gallbladder.  The liver, spleen, pancreas,
and retroperitoneal lymph nodes are unremarkable.  Calcification of
the aorta without aneurysm.  2.5 cm diameter right adrenal gland
nodule appears stable since previous study.  The kidneys
demonstrate suggestion of striated nephrogram which may be due to
the phase of contrast administration but pyelonephritis can also
have this appearance.  There is no hydronephrosis or solid mass
appreciated.  The stomach, small bowel, and colon are not
abnormally distended.  No free air or free fluid in the abdomen.
The stool fills the colon diffusely.

Pelvis:  The bladder is decompressed.    There is fluid in the
posterior mid pelvis which is difficult to localize.  This could be
due to flattening of the bladder or acute free fluid layering in
the low pelvis.  No loculated fluid or abscess.  No evidence of
diverticulitis.  The appendix is normal.  Mild degenerative changes
in the lumbar spine.  Probable postoperative changes in the left
iliac crest.
IMPRESSION: Possible striated nephrograms bilaterally may represent
pyelonephritis.  Free fluid in the pelvis versus layering of fluid
in the bladder.  Stable right adrenal gland nodule.

## 2012-08-07 MED ORDER — KETOROLAC TROMETHAMINE 30 MG/ML IJ SOLN
30.0000 mg | Freq: Once | INTRAMUSCULAR | Status: AC
  Administered 2012-08-07: 30 mg via INTRAVENOUS
  Filled 2012-08-07: qty 1

## 2012-08-07 MED ORDER — IOHEXOL 300 MG/ML  SOLN
50.0000 mL | Freq: Once | INTRAMUSCULAR | Status: AC | PRN
  Administered 2012-08-07: 50 mL via ORAL

## 2012-08-07 MED ORDER — GI COCKTAIL ~~LOC~~
30.0000 mL | Freq: Once | ORAL | Status: AC
  Administered 2012-08-07: 30 mL via ORAL
  Filled 2012-08-07: qty 30

## 2012-08-07 MED ORDER — SUCRALFATE 1 GM/10ML PO SUSP: 1.0000 g | Freq: Four times a day (QID) | ORAL | Status: DC

## 2012-08-07 MED ORDER — SODIUM CHLORIDE 0.9 % IV BOLUS (SEPSIS)
1000.0000 mL | Freq: Once | INTRAVENOUS | Status: AC
  Administered 2012-08-07: 1000 mL via INTRAVENOUS

## 2012-08-07 MED ORDER — IOHEXOL 300 MG/ML  SOLN
100.0000 mL | Freq: Once | INTRAMUSCULAR | Status: AC | PRN
  Administered 2012-08-07: 100 mL via INTRAVENOUS

## 2012-08-07 NOTE — ED Notes (Signed)
MD at bedside. 

## 2012-08-07 NOTE — ED Notes (Signed)
Pt c/o elevated blood sugar. Pt state BS was 462 then pt took 32 units of lantus 10 units of novalog at 10 pm. Pt re-checked sugar 1030 and 559. Pt was seen here last night for same. Pt states he is still having epigastric pain.

## 2012-08-07 NOTE — ED Provider Notes (Addendum)
History     CSN: [id number]  Arrival date & time 08/07/12  0013   First MD Initiated Contact with Patient 08/07/12 [phone]      Chief Complaint  Patient presents with  . Hyperglycemia    (Consider location/radiation/quality/duration/timing/severity/associated sxs/prior treatment) Patient is a 51 y.o. male presenting with diabetes problem. The history is provided by the patient.  Diabetes He presents for his follow-up diabetic visit. He has type 2 diabetes mellitus. No MedicAlert identification noted. His disease course has been fluctuating. Pertinent negatives for hypoglycemia include no confusion, dizziness, headaches, hunger, mood changes, nervousness/anxiousness, pallor, seizures, sleepiness, speech difficulty, sweats or tremors. Pertinent negatives for diabetes include no blurred vision, no chest pain, no fatigue, no foot paresthesias, no foot ulcerations, no polydipsia, no polyphagia, no polyuria, no visual change, no weakness and no weight loss. Symptoms are worsening. Risk factors for coronary artery disease include diabetes mellitus. Current diabetic treatment includes insulin injections. He is compliant with treatment some of the time.  Also still having epigastric pain.  No CP, no radiation into the chest no DOE no SOB.    Past Medical History  Diagnosis Date  . Diabetes mellitus   . Drug abuse and dependence   . CHF (congestive heart failure)     Past Surgical History  Procedure Date  . Difibula     No family history on file.  History  Substance Use Topics  . Smoking status: Current Every Day Smoker -- 1.0 packs/day    Types: Cigarettes  . Smokeless tobacco: Not on file  . Alcohol Use: No      Review of Systems  Constitutional: Negative for weight loss and fatigue.  Eyes: Negative for blurred vision.  Respiratory: Negative for cough, chest tightness and shortness of breath.   Cardiovascular: Negative for chest pain, palpitations and leg swelling.    Genitourinary: Negative for polyuria.  Skin: Negative for pallor.  Neurological: Negative for dizziness, tremors, seizures, speech difficulty, weakness and headaches.  Hematological: Negative for polydipsia and polyphagia.  Psychiatric/Behavioral: Negative for confusion. The patient is not nervous/anxious.   All other systems reviewed and are negative.    Allergies  Review of patient's allergies indicates no known allergies.  Home Medications   Current Outpatient Rx  Name Route Sig Dispense Refill  . ASPIRIN 325 MG PO TABS Oral Take 325 mg by mouth daily.    Marland Kitchen CARVEDILOL 3.125 MG PO TABS Oral Take 3.125 mg by mouth 2 (two) times daily with a meal.    . FAMOTIDINE 20 MG PO TABS Oral Take 1 tablet (20 mg total) by mouth 2 (two) times daily. 30 tablet 0  . INSULIN ASPART 100 UNIT/ML [place] SOLN Subcutaneous Inject 3 Units into the skin 3 (three) times daily before meals.    . INSULIN GLARGINE 100 UNIT/ML [place] SOLN Subcutaneous Inject 32 Units into the skin at bedtime.    . INSULIN GLARGINE 100 UNIT/ML  SOLN Subcutaneous Inject 32 Units into the skin at bedtime. 10 mL 6  . LISINOPRIL 2.5 MG PO TABS Oral Take 2.5 mg by mouth daily.    Marland Kitchen SIMVASTATIN 80 MG PO TABS Oral Take 80 mg by mouth at bedtime.    . SPIRONOLACTONE 25 MG PO TABS Oral Take 12.5 mg by mouth daily.    . WARFARIN SODIUM 2 MG PO TABS Oral Take 2 mg by mouth daily. 2 mg in addition to 5 mg    . WARFARIN SODIUM 5 MG PO TABS Oral Take 5 mg  by mouth daily. 5 mg in addition to 2 mg      BP 119/77  Pulse 75  Temp 97.8 F (36.6 C) (Oral)  Resp 18  Ht 6' (1.829 m)  Wt 165 lb (74.844 kg)  BMI 22.38 kg/m2  SpO2 100%  Physical Exam  Constitutional: He is oriented to person, place, and time. He appears well-developed and well-nourished. No distress.  HENT:  Head: Normocephalic and atraumatic.  Mouth/Throat: Oropharynx is clear and moist.  Eyes: Conjunctivae normal are normal. Pupils are equal, round, and reactive to light.   Neck: Normal range of motion. Neck supple.  Cardiovascular: Normal rate and regular rhythm.   Pulmonary/Chest: Effort normal and breath sounds normal. He has no wheezes. He has no rales.  Abdominal: Soft. Bowel sounds are normal. He exhibits no distension and no mass. There is no tenderness. There is no rebound and no guarding.  Musculoskeletal: Normal range of motion.  Neurological: He is alert and oriented to person, place, and time.  Skin: Skin is warm and dry.  Psychiatric: He has a normal mood and affect.    ED Course  Procedures (including critical care time)  Labs Reviewed  GLUCOSE, CAPILLARY - Abnormal; Notable for the following:    Glucose-Capillary 415 (*)     All other components within normal limits  CBC WITH DIFFERENTIAL - Abnormal; Notable for the following:    WBC 16.0 (*)     Neutro Abs 11.0 (*)     Monocytes Absolute 1.2 (*)     All other components within normal limits  COMPREHENSIVE METABOLIC PANEL - Abnormal; Notable for the following:    Sodium 134 (*)     Glucose, Bld 414 (*)     All other components within normal limits  PROTIME-INR - Abnormal; Notable for the following:    Prothrombin Time 22.5 (*)     INR 2.08 (*)     All other components within normal limits  GLUCOSE, CAPILLARY - Abnormal; Notable for the following:    Glucose-Capillary 237 (*)     All other components within normal limits  LIPASE, BLOOD  TROPONIN I   Dg Chest Port 1 View  08/05/2012  *RADIOLOGY REPORT*  Clinical Data: Cells right 90 epigastric burning.  PORTABLE CHEST - 1 VIEW  Comparison: Two-view chest 04/06/2012.  Findings: A dual lead pacemaker / defibrillator is stable.  The heart size is exaggerated by low lung volumes.  No focal airspace disease is evident.  The visualized soft tissues and bony thorax are unremarkable.  IMPRESSION:  1.  Low lung volumes. 2.  No acute cardiopulmonary disease.   Original Report Authenticated By: Jamesetta Orleans. MATTERN, M.D.      No diagnosis  found.    MDM  No Cp, pain is abdominal and with unchanges ekg and one negative troponin with > 8 hours of symptoms is sufficient to exclude ACS.  Pain is clearly in abdomen.  No CVA, no fever no urinary symptoms tenderness.  Doubt pyleonephritis but sent urine for culture.  Doubt free fluid on CXT scan but patient given strict return precautions and Patient informed to return immediately for worsening symptoms, fever, flank pain, worsening pain, vomiting or any concerns.  Patient verbalizes understanding and agrees to follow up     Date: 08/07/2012  Rate: 72  Rhythm: normal sinus rhythm  QRS Axis: normal  Intervals: normal  ST/T Wave abnormalities: nonspecific ST changes  Conduction Disutrbances:none  Narrative Interpretation:   Old EKG Reviewed: unchanged  Jasmine Awe, MD 08/07/12 [id number]  April K Palumbo-Rasch, MD 08/07/12 2300

## 2012-08-09 LAB — URINE CULTURE
Colony Count: NO GROWTH
Culture: NO GROWTH

## 2012-09-04 ENCOUNTER — Encounter (HOSPITAL_COMMUNITY): Payer: Self-pay | Admitting: *Deleted

## 2012-09-04 ENCOUNTER — Emergency Department (HOSPITAL_COMMUNITY)
Admission: EM | Admit: 2012-09-04 | Discharge: 2012-09-05 | Disposition: A | Discharge status: Home / Self Care | Payer: Medicare Other | Attending: Emergency Medicine | Admitting: Emergency Medicine

## 2012-09-04 DIAGNOSIS — R112 Nausea with vomiting, unspecified: Secondary | ICD-10-CM | POA: Insufficient documentation

## 2012-09-04 DIAGNOSIS — I509 Heart failure, unspecified: Secondary | ICD-10-CM | POA: Insufficient documentation

## 2012-09-04 DIAGNOSIS — K3184 Gastroparesis: Secondary | ICD-10-CM | POA: Insufficient documentation

## 2012-09-04 DIAGNOSIS — Z7982 Long term (current) use of aspirin: Secondary | ICD-10-CM | POA: Insufficient documentation

## 2012-09-04 DIAGNOSIS — E119 Type 2 diabetes mellitus without complications: Secondary | ICD-10-CM | POA: Insufficient documentation

## 2012-09-04 DIAGNOSIS — R110 Nausea: Secondary | ICD-10-CM | POA: Insufficient documentation

## 2012-09-04 DIAGNOSIS — R1013 Epigastric pain: Secondary | ICD-10-CM | POA: Insufficient documentation

## 2012-09-04 DIAGNOSIS — R0602 Shortness of breath: Secondary | ICD-10-CM | POA: Insufficient documentation

## 2012-09-04 DIAGNOSIS — Z794 Long term (current) use of insulin: Secondary | ICD-10-CM | POA: Insufficient documentation

## 2012-09-04 DIAGNOSIS — Z7901 Long term (current) use of anticoagulants: Secondary | ICD-10-CM | POA: Insufficient documentation

## 2012-09-04 DIAGNOSIS — F1910 Other psychoactive substance abuse, uncomplicated: Secondary | ICD-10-CM | POA: Insufficient documentation

## 2012-09-04 DIAGNOSIS — F17200 Nicotine dependence, unspecified, uncomplicated: Secondary | ICD-10-CM | POA: Insufficient documentation

## 2012-09-04 DIAGNOSIS — Z79899 Other long term (current) drug therapy: Secondary | ICD-10-CM | POA: Insufficient documentation

## 2012-09-04 NOTE — ED Notes (Signed)
Pt states epigastric pain started yesterday and he vomited 4 times,  States he was seen 3 weeks ago at [facility] for the same and was given a liquid medication that helped but he is out of that now and he also doesn't know if it is his congestive heart failure "acting up"

## 2012-09-05 ENCOUNTER — Emergency Department (HOSPITAL_COMMUNITY): Payer: Medicare Other

## 2012-09-05 LAB — COMPREHENSIVE METABOLIC PANEL
ALT: 15 U/L (ref 0–53)
AST: 18 U/L (ref 0–37)
Albumin: 3.6 g/dL (ref 3.5–5.2)
Alkaline Phosphatase: 100 U/L (ref 39–117)
BUN: 14 mg/dL (ref 6–23)
CO2: 26 mEq/L (ref 19–32)
Calcium: 9.4 mg/dL (ref 8.4–10.5)
Chloride: 98 mEq/L (ref 96–112)
Creatinine, Ser: 0.81 mg/dL (ref 0.50–1.35)
GFR calc Af Amer: >90 mL/min (ref >90)
GFR calc non Af Amer: >90 mL/min (ref >90)
Glucose, Bld: 252 mg/dL — ABNORMAL HIGH (ref 70–99)
Potassium: 4 mEq/L (ref 3.5–5.1)
Sodium: 133 mEq/L — ABNORMAL LOW (ref 135–145)
Total Bilirubin: 0.3 mg/dL (ref 0.3–1.2)
Total Protein: 7 g/dL (ref 6.0–8.3)

## 2012-09-05 LAB — POCT I-STAT, CHEM 8
BUN: 14 mg/dL (ref 6–23)
Calcium, Ion: 1.24 mmol/L — ABNORMAL HIGH (ref 1.12–1.23)
Chloride: 102 mEq/L (ref 96–112)
Creatinine, Ser: 1 mg/dL (ref 0.50–1.35)
Glucose, Bld: 247 mg/dL — ABNORMAL HIGH (ref 70–99)
HCT: 42 % (ref 39.0–52.0)
Hemoglobin: 14.3 g/dL (ref 13.0–17.0)
Potassium: 4 mEq/L (ref 3.5–5.1)
Sodium: 136 mEq/L (ref 135–145)
TCO2: 24 mmol/L (ref 0–100)

## 2012-09-05 LAB — POCT I-STAT TROPONIN I: Troponin i, poc: 0.02 ng/mL (ref 0.00–0.08)

## 2012-09-05 LAB — CBC
HCT: 38.9 % — ABNORMAL LOW (ref 39.0–52.0)
Hemoglobin: 13.1 g/dL (ref 13.0–17.0)
MCH: 32.1 pg (ref 26.0–34.0)
MCHC: 33.7 g/dL (ref 30.0–36.0)
MCV: 95.3 fL (ref 78.0–100.0)
Platelets: 218 10*3/uL (ref 150–400)
RBC: 4.08 MIL/uL — ABNORMAL LOW (ref 4.22–5.81)
RDW: 13.5 % (ref 11.5–15.5)
WBC: 10.3 10*3/uL (ref 4.0–10.5)

## 2012-09-05 LAB — PRO B NATRIURETIC PEPTIDE: Pro B Natriuretic peptide (BNP): 174.5 pg/mL — ABNORMAL HIGH (ref 0–125)

## 2012-09-05 LAB — LIPASE, BLOOD: Lipase: 42 U/L (ref 11–59)

## 2012-09-05 IMAGING — CR DG CHEST 2V
2 series · 2 of 2 positions shown · non-contrast
Comparison: [DATE]

CLINICAL DATA: Chest pain, nausea, abdominal pain, shortness of
breath.

CHEST - 2 VIEW

[w chest pa]
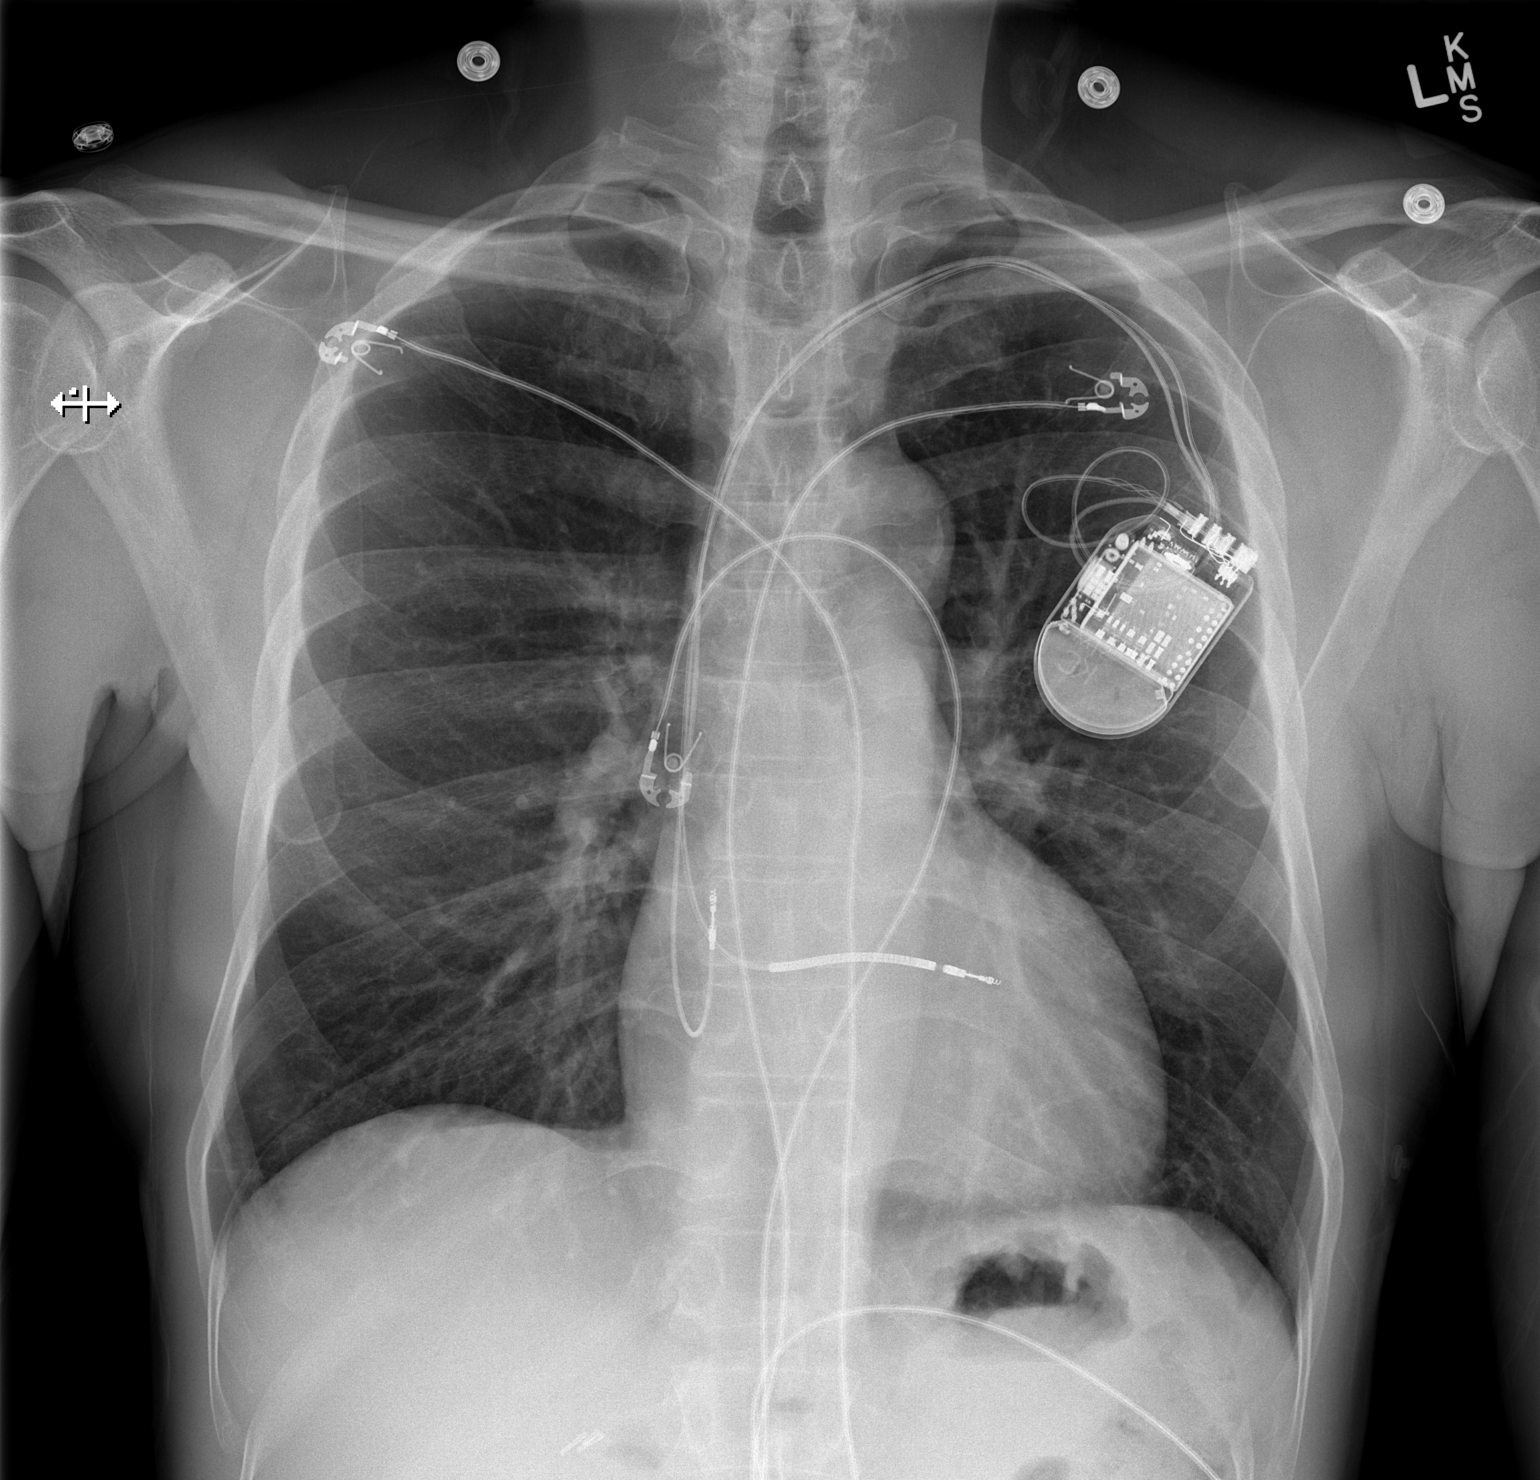

[w chest lat]
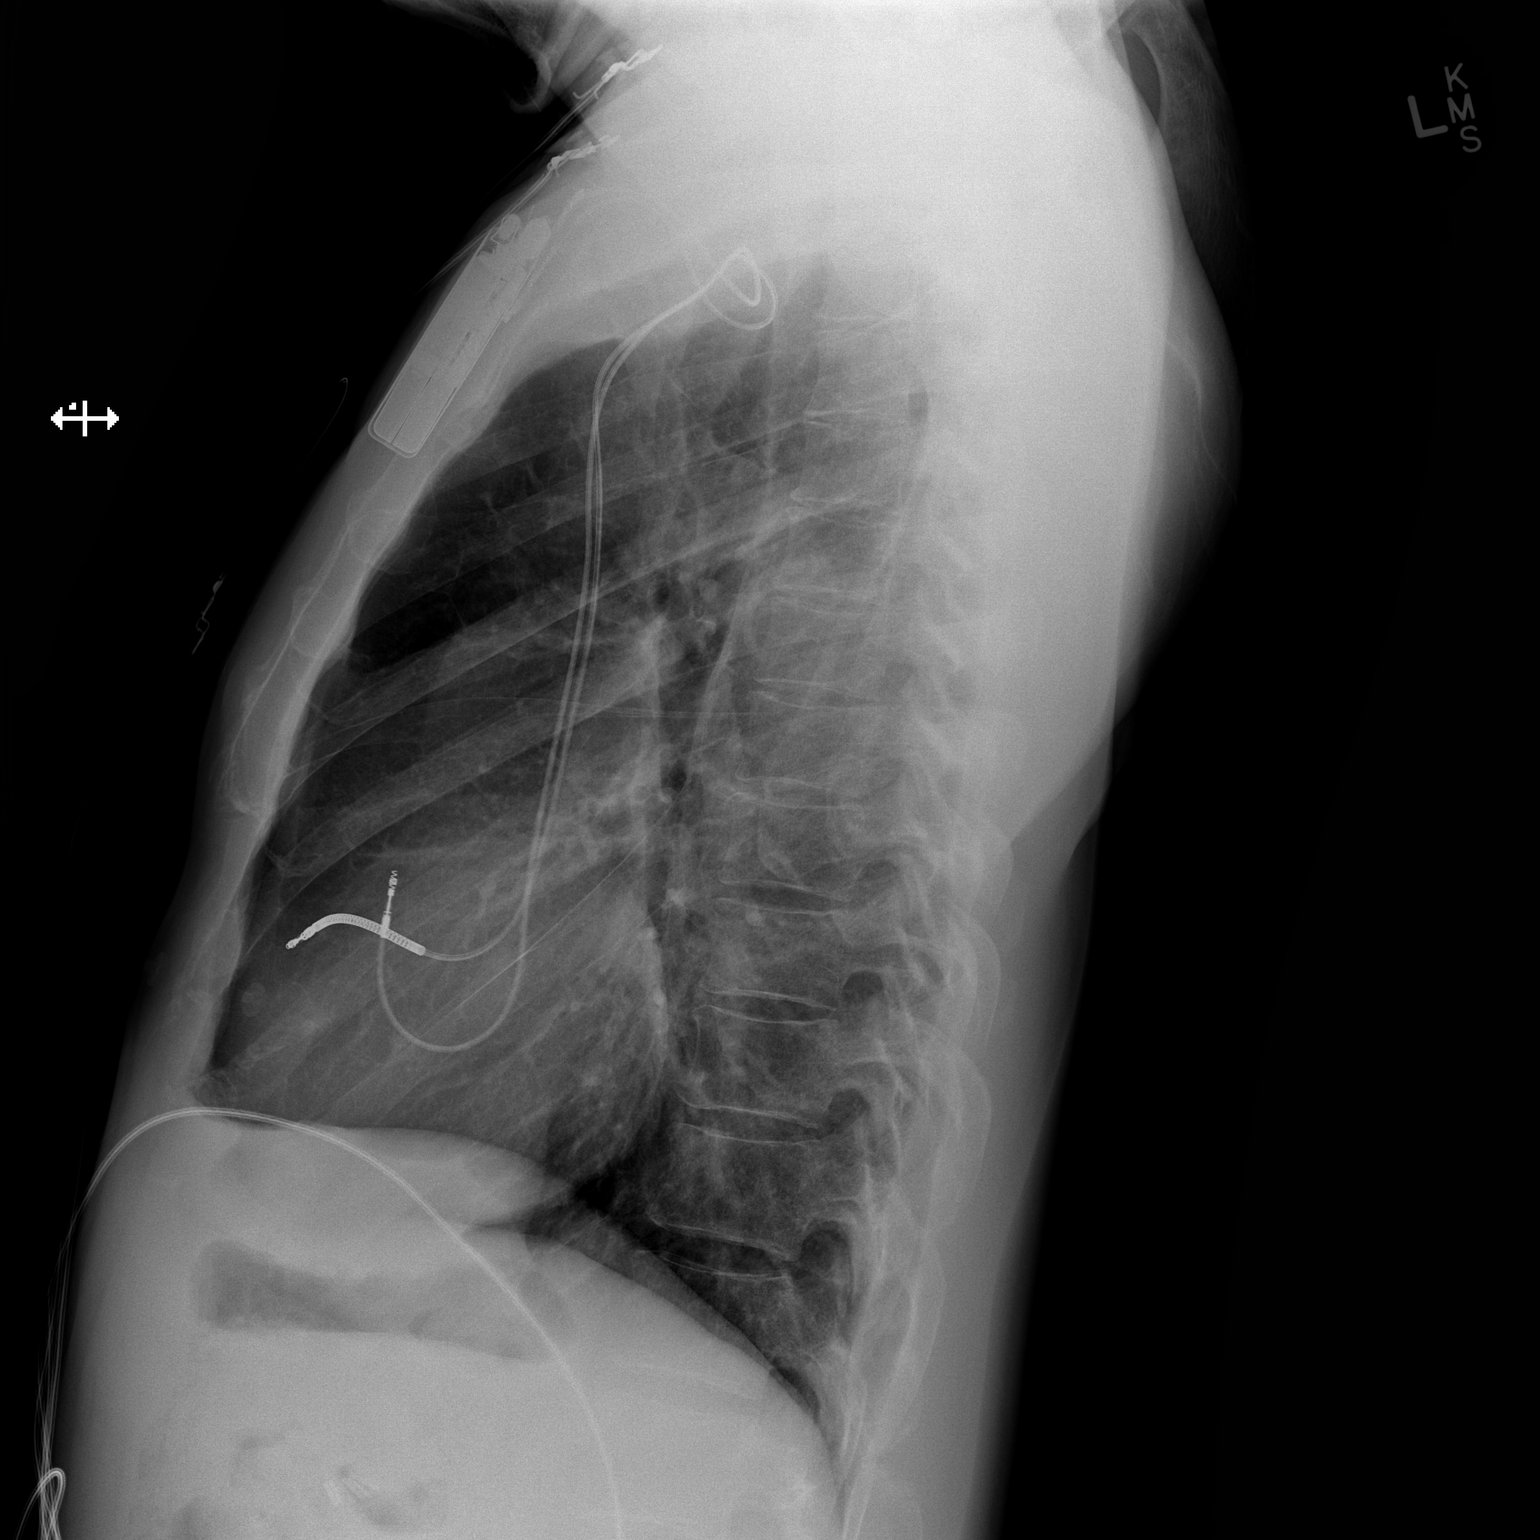

[2 of 2 positions shown; findings below may reference images not displayed]

FINDINGS: The stable appearance of cardiac pacemaker since previous
study.  Normal heart size and pulmonary vascularity.  No focal
airspace consolidation in the lungs.  No blunting of costophrenic
angles.  No pneumothorax.  Mediastinal contours appear intact.
Calcification of the aorta.  Surgical clips in the right upper
quadrant.  No significant change since previous study.
IMPRESSION: No evidence of active pulmonary disease.

## 2012-09-05 MED ORDER — METOCLOPRAMIDE HCL 10 MG PO TABS
20.0000 mg | ORAL_TABLET | Freq: Once | ORAL | Status: AC
  Administered 2012-09-05: 20 mg via ORAL
  Filled 2012-09-05: qty 2

## 2012-09-05 MED ORDER — METOCLOPRAMIDE HCL 10 MG PO TABS: 10.0000 mg | ORAL_TABLET | Freq: Four times a day (QID) | ORAL | Status: DC

## 2012-09-05 MED ORDER — HYDROMORPHONE HCL PF 1 MG/ML IJ SOLN
1.0000 mg | Freq: Once | INTRAMUSCULAR | Status: AC
  Administered 2012-09-05: 1 mg via INTRAMUSCULAR

## 2012-09-05 MED ORDER — SUCRALFATE 1 GM/10ML PO SUSP: 1.0000 g | Freq: Four times a day (QID) | ORAL | Status: DC

## 2012-09-05 MED ORDER — GI COCKTAIL ~~LOC~~
30.0000 mL | Freq: Once | ORAL | Status: AC
  Administered 2012-09-05: 30 mL via ORAL
  Filled 2012-09-05: qty 30

## 2012-09-05 MED ORDER — HYDROMORPHONE HCL PF 2 MG/ML IJ SOLN
2.0000 mg | Freq: Once | INTRAMUSCULAR | Status: DC
  Filled 2012-09-05: qty 1

## 2012-09-05 NOTE — ED Provider Notes (Signed)
Medical screening examination/treatment/procedure(s) were performed by non-physician practitioner and as supervising physician I was immediately available for consultation/collaboration.  Cheri Guppy, MD 09/05/12 1505

## 2012-09-05 NOTE — ED Provider Notes (Signed)
History     CSN: [id number]  Arrival date & time 09/04/12  2310   First MD Initiated Contact with Patient 09/04/12 2342      Chief Complaint  Patient presents with  . Chest Pain  . Shortness of Breath  . Abdominal Pain  . Nausea    (Consider location/radiation/quality/duration/timing/severity/associated sxs/prior treatment) HPI Comments: Patient is a 51 year old male with a past medical history of diabetes and CHF who presents with abdominal pain that started 2 days ago. The pain is located in his epigastrium and does radiate to the LUQ. The pain is described as aching and severe. The pain started gradually and progressively worsened since the onset. No alleviating/aggravating factors. The patient has tried nothing for symptoms without relief. Associated symptoms include nausea, vomiting and SOB. Patient denies fever, headache, chest pain, diarrhea, dysuria, constipation.      Past Medical History  Diagnosis Date  . Diabetes mellitus   . Drug abuse and dependence   . CHF (congestive heart failure)     Past Surgical History  Procedure Date  . Difibula     History reviewed. No pertinent family history.  History  Substance Use Topics  . Smoking status: Current Every Day Smoker -- 1.0 packs/day    Types: Cigarettes  . Smokeless tobacco: Not on file  . Alcohol Use: No      Review of Systems  Gastrointestinal: Positive for nausea, vomiting and abdominal pain.  All other systems reviewed and are negative.    Allergies  Review of patient's allergies indicates no known allergies.  Home Medications   Current Outpatient Rx  Name  Route  Sig  Dispense  Refill  . ASPIRIN 325 MG PO TABS   Oral   Take 325 mg by mouth daily.         Marland Kitchen CARVEDILOL 3.125 MG PO TABS   Oral   Take 3.125 mg by mouth 2 (two) times daily with a meal.         . FAMOTIDINE 20 MG PO TABS   Oral   Take 1 tablet (20 mg total) by mouth 2 (two) times daily.   30 tablet   0   . INSULIN  ASPART 100 UNIT/ML [place] SOLN   Subcutaneous   Inject 3 Units into the skin 3 (three) times daily before meals.         . INSULIN GLARGINE 100 UNIT/ML [place] SOLN   Subcutaneous   Inject 32 Units into the skin at bedtime.         Marland Kitchen LISINOPRIL 2.5 MG PO TABS   Oral   Take 2.5 mg by mouth daily.         Marland Kitchen SIMVASTATIN 80 MG PO TABS   Oral   Take 80 mg by mouth at bedtime.         . SPIRONOLACTONE 25 MG PO TABS   Oral   Take 12.5 mg by mouth daily.         . SUCRALFATE 1 GM/10ML PO SUSP   Oral   Take 10 mLs (1 g total) by mouth 4 (four) times daily.   420 mL   0   . WARFARIN SODIUM 2 MG PO TABS   Oral   Take 2 mg by mouth daily. 2 mg in addition to 5 mg         . WARFARIN SODIUM 5 MG PO TABS   Oral   Take 5 mg by mouth daily. 5 mg in  addition to 2 mg           BP 119/83  Pulse 84  Temp 98.4 F (36.9 C) (Oral)  Resp 21  SpO2 100%  Physical Exam  Nursing note and vitals reviewed. Constitutional: He is oriented to person, place, and time. He appears well-developed and well-nourished. No distress.  HENT:  Head: Normocephalic and atraumatic.  Mouth/Throat: Oropharynx is clear and moist. No oropharyngeal exudate.  Eyes: Conjunctivae normal and EOM are normal. Pupils are equal, round, and reactive to light. No scleral icterus.  Neck: Normal range of motion. Neck supple.  Cardiovascular: Normal rate and regular rhythm.  Exam reveals no gallop and no friction rub.   No murmur heard.      No edema of lower extremities.   Pulmonary/Chest: Effort normal and breath sounds normal. He has no wheezes. He has no rales. He exhibits no tenderness.  Abdominal: Soft. There is tenderness.       Epigastric and LUQ tenderness to palpation. No peritoneal signs.   Musculoskeletal: Normal range of motion.  Neurological: He is alert and oriented to person, place, and time. Coordination normal.       Speech is goal-oriented. Moves limbs without ataxia.   Skin: Skin is warm and dry.  He is not diaphoretic.  Psychiatric: He has a normal mood and affect. His behavior is normal.    ED Course  Procedures (including critical care time)   Date: 09/05/2012  Rate: 84  Rhythm: normal sinus rhythm  QRS Axis: normal  Intervals: normal  ST/T Wave abnormalities: normal  Conduction Disutrbances:nonspecific intraventricular conduction delay  Narrative Interpretation: NSR unchanged from previous  Old EKG Reviewed: unchanged    Labs Reviewed  CBC - Abnormal; Notable for the following:    RBC 4.08 (*)     HCT 38.9 (*)     All other components within normal limits  COMPREHENSIVE METABOLIC PANEL - Abnormal; Notable for the following:    Sodium 133 (*)     Glucose, Bld 252 (*)     All other components within normal limits  PRO B NATRIURETIC PEPTIDE - Abnormal; Notable for the following:    Pro B Natriuretic peptide (BNP) 174.5 (*)     All other components within normal limits  POCT I-STAT, CHEM 8 - Abnormal; Notable for the following:    Glucose, Bld 247 (*)     Calcium, Ion 1.24 (*)     All other components within normal limits  LIPASE, BLOOD  POCT I-STAT TROPONIN I   Dg Chest 2 View  09/05/2012  *RADIOLOGY REPORT*  Clinical Data: Chest pain, nausea, abdominal pain, shortness of breath.  CHEST - 2 VIEW  Comparison: 08/07/2012  Findings: The stable appearance of cardiac pacemaker since previous study.  Normal heart size and pulmonary vascularity.  No focal airspace consolidation in the lungs.  No blunting of costophrenic angles.  No pneumothorax.  Mediastinal contours appear intact. Calcification of the aorta.  Surgical clips in the right upper quadrant.  No significant change since previous study.  IMPRESSION: No evidence of active pulmonary disease.   Original Report Authenticated By: Burman Nieves, M.D.      1. Gastroparesis       MDM  12:18 AM Chest xray, labs pending.   2:52 AM Glucose elevated otherwise labs unremarkable. Patient feels relief with GI  cocktail. Patient likely experiencing gastroparesis. He reports not checking his BG in a few days. I will discharged the patient with reglan and carafate. Patient instructed  to return with worsening or concerning symptoms.         Emilia Beck, PA-C 09/05/12 [phone]

## 2012-09-12 ENCOUNTER — Emergency Department (HOSPITAL_COMMUNITY): Payer: Medicare Other

## 2012-09-12 ENCOUNTER — Emergency Department (HOSPITAL_COMMUNITY)
Admission: EM | Admit: 2012-09-12 | Discharge: 2012-09-12 | Disposition: A | Discharge status: Home / Self Care | Payer: Medicare Other | Attending: Emergency Medicine | Admitting: Emergency Medicine

## 2012-09-12 ENCOUNTER — Encounter (HOSPITAL_COMMUNITY): Payer: Self-pay | Admitting: Emergency Medicine

## 2012-09-12 DIAGNOSIS — I509 Heart failure, unspecified: Secondary | ICD-10-CM | POA: Insufficient documentation

## 2012-09-12 DIAGNOSIS — Z7901 Long term (current) use of anticoagulants: Secondary | ICD-10-CM | POA: Insufficient documentation

## 2012-09-12 DIAGNOSIS — F17200 Nicotine dependence, unspecified, uncomplicated: Secondary | ICD-10-CM | POA: Insufficient documentation

## 2012-09-12 DIAGNOSIS — Z7982 Long term (current) use of aspirin: Secondary | ICD-10-CM | POA: Insufficient documentation

## 2012-09-12 DIAGNOSIS — K5900 Constipation, unspecified: Secondary | ICD-10-CM | POA: Insufficient documentation

## 2012-09-12 DIAGNOSIS — Z79899 Other long term (current) drug therapy: Secondary | ICD-10-CM | POA: Insufficient documentation

## 2012-09-12 DIAGNOSIS — Z794 Long term (current) use of insulin: Secondary | ICD-10-CM | POA: Insufficient documentation

## 2012-09-12 DIAGNOSIS — E119 Type 2 diabetes mellitus without complications: Secondary | ICD-10-CM | POA: Insufficient documentation

## 2012-09-12 LAB — COMPREHENSIVE METABOLIC PANEL
ALT: 23 U/L (ref 0–53)
AST: 19 U/L (ref 0–37)
Albumin: 3.7 g/dL (ref 3.5–5.2)
Alkaline Phosphatase: 121 U/L — ABNORMAL HIGH (ref 39–117)
BUN: 12 mg/dL (ref 6–23)
CO2: 29 mEq/L (ref 19–32)
Calcium: 10 mg/dL (ref 8.4–10.5)
Chloride: 96 mEq/L (ref 96–112)
Creatinine, Ser: 0.85 mg/dL (ref 0.50–1.35)
GFR calc Af Amer: >90 mL/min (ref >90)
GFR calc non Af Amer: >90 mL/min (ref >90)
Glucose, Bld: 258 mg/dL — ABNORMAL HIGH (ref 70–99)
Potassium: 4.4 mEq/L (ref 3.5–5.1)
Sodium: 132 mEq/L — ABNORMAL LOW (ref 135–145)
Total Bilirubin: 0.3 mg/dL (ref 0.3–1.2)
Total Protein: 7.2 g/dL (ref 6.0–8.3)

## 2012-09-12 LAB — PROTIME-INR
INR: 2.16 — ABNORMAL HIGH (ref 0.00–1.49)
Prothrombin Time: 23.2 seconds — ABNORMAL HIGH (ref 11.6–15.2)

## 2012-09-12 LAB — CBC WITH DIFFERENTIAL/PLATELET
Basophils Absolute: 0 10*3/uL (ref 0.0–0.1)
Basophils Relative: 0 % (ref 0–1)
Eosinophils Absolute: 0.3 10*3/uL (ref 0.0–0.7)
Eosinophils Relative: 3 % (ref 0–5)
HCT: 42.5 % (ref 39.0–52.0)
Hemoglobin: 14.5 g/dL (ref 13.0–17.0)
Lymphocytes Relative: 35 % (ref 12–46)
Lymphs Abs: 3.3 10*3/uL (ref 0.7–4.0)
MCH: 31.9 pg (ref 26.0–34.0)
MCHC: 34.1 g/dL (ref 30.0–36.0)
MCV: 93.4 fL (ref 78.0–100.0)
Monocytes Absolute: 1 10*3/uL (ref 0.1–1.0)
Monocytes Relative: 11 % (ref 3–12)
Neutro Abs: 4.7 10*3/uL (ref 1.7–7.7)
Neutrophils Relative %: 50 % (ref 43–77)
Platelets: 212 10*3/uL (ref 150–400)
RBC: 4.55 MIL/uL (ref 4.22–5.81)
RDW: 12.9 % (ref 11.5–15.5)
WBC: 9.4 10*3/uL (ref 4.0–10.5)

## 2012-09-12 LAB — URINALYSIS, ROUTINE W REFLEX MICROSCOPIC
Bilirubin Urine: NEGATIVE
Glucose, UA: >1000 mg/dL — AB
Hgb urine dipstick: NEGATIVE
Ketones, ur: NEGATIVE mg/dL
Leukocytes, UA: NEGATIVE
Nitrite: NEGATIVE
Protein, ur: NEGATIVE mg/dL
Specific Gravity, Urine: 1.043 — ABNORMAL HIGH (ref 1.005–1.030)
Urobilinogen, UA: 0.2 mg/dL (ref 0.0–1.0)
pH: 5.5 (ref 5.0–8.0)

## 2012-09-12 LAB — URINE MICROSCOPIC-ADD ON

## 2012-09-12 LAB — LIPASE, BLOOD: Lipase: 37 U/L (ref 11–59)

## 2012-09-12 IMAGING — CR DG ABDOMEN ACUTE W/ 1V CHEST
3 series · 3 of 3 positions shown · non-contrast
Comparison: Chest radiograph on [DATE]

CLINICAL DATA: Abdominal pain.  Nausea and vomiting.

ACUTE ABDOMEN SERIES (ABDOMEN 2 VIEW & CHEST 1 VIEW)

[w chest pa]
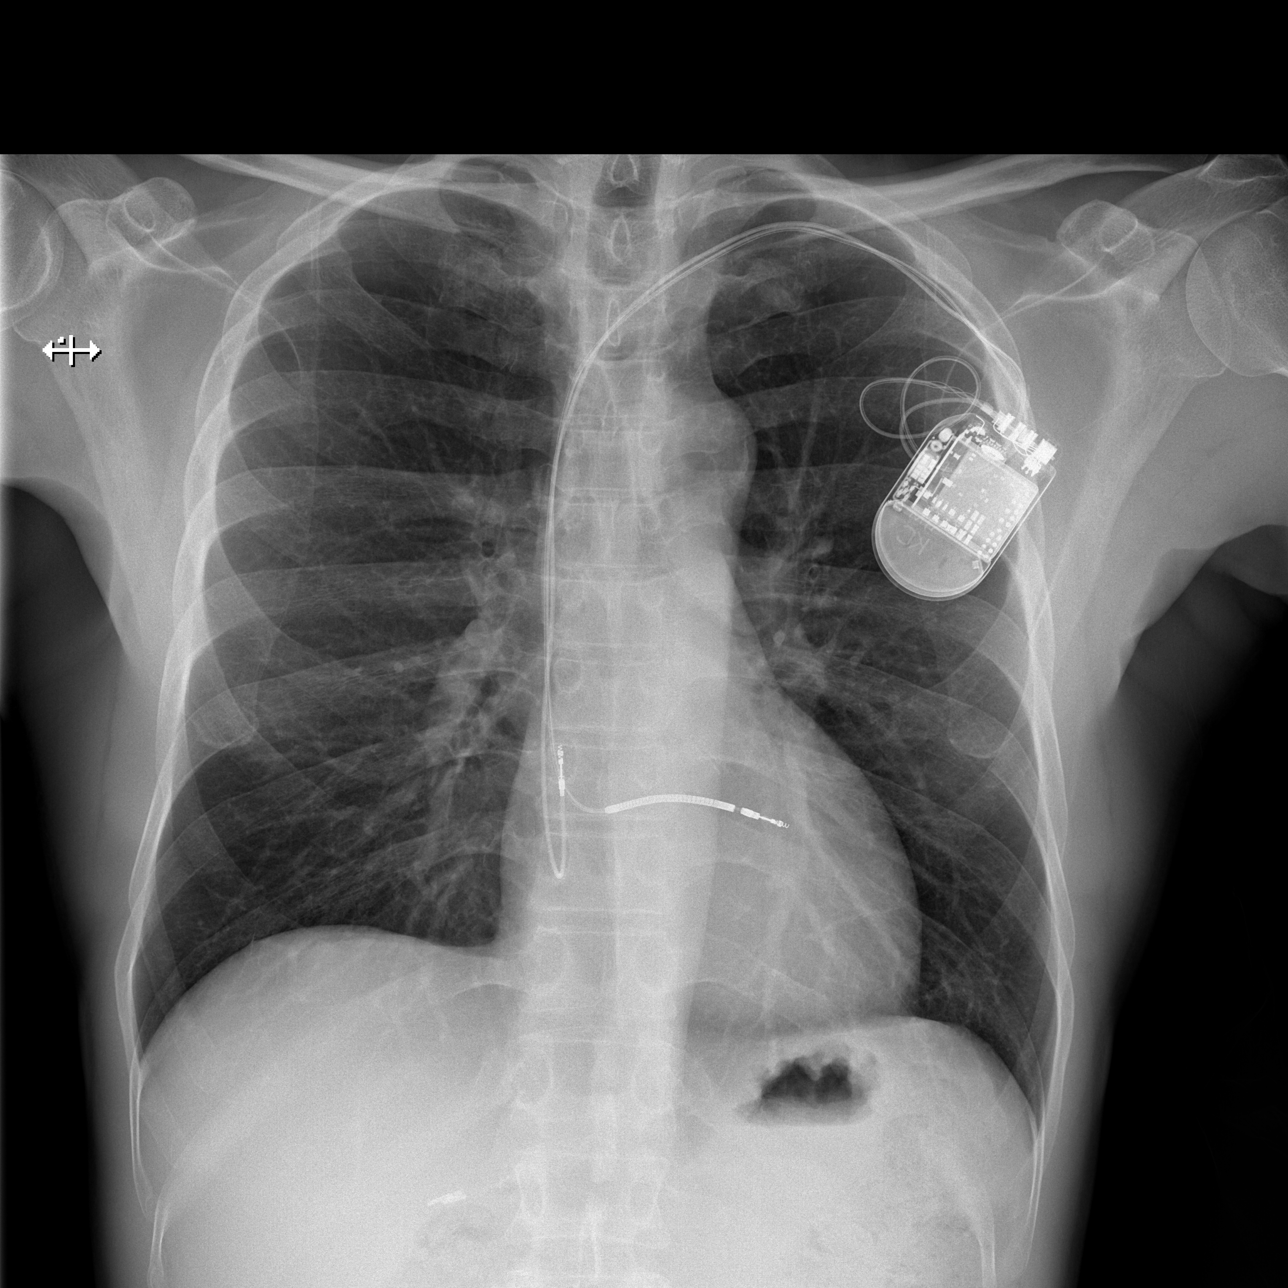

[w abdomen upright]
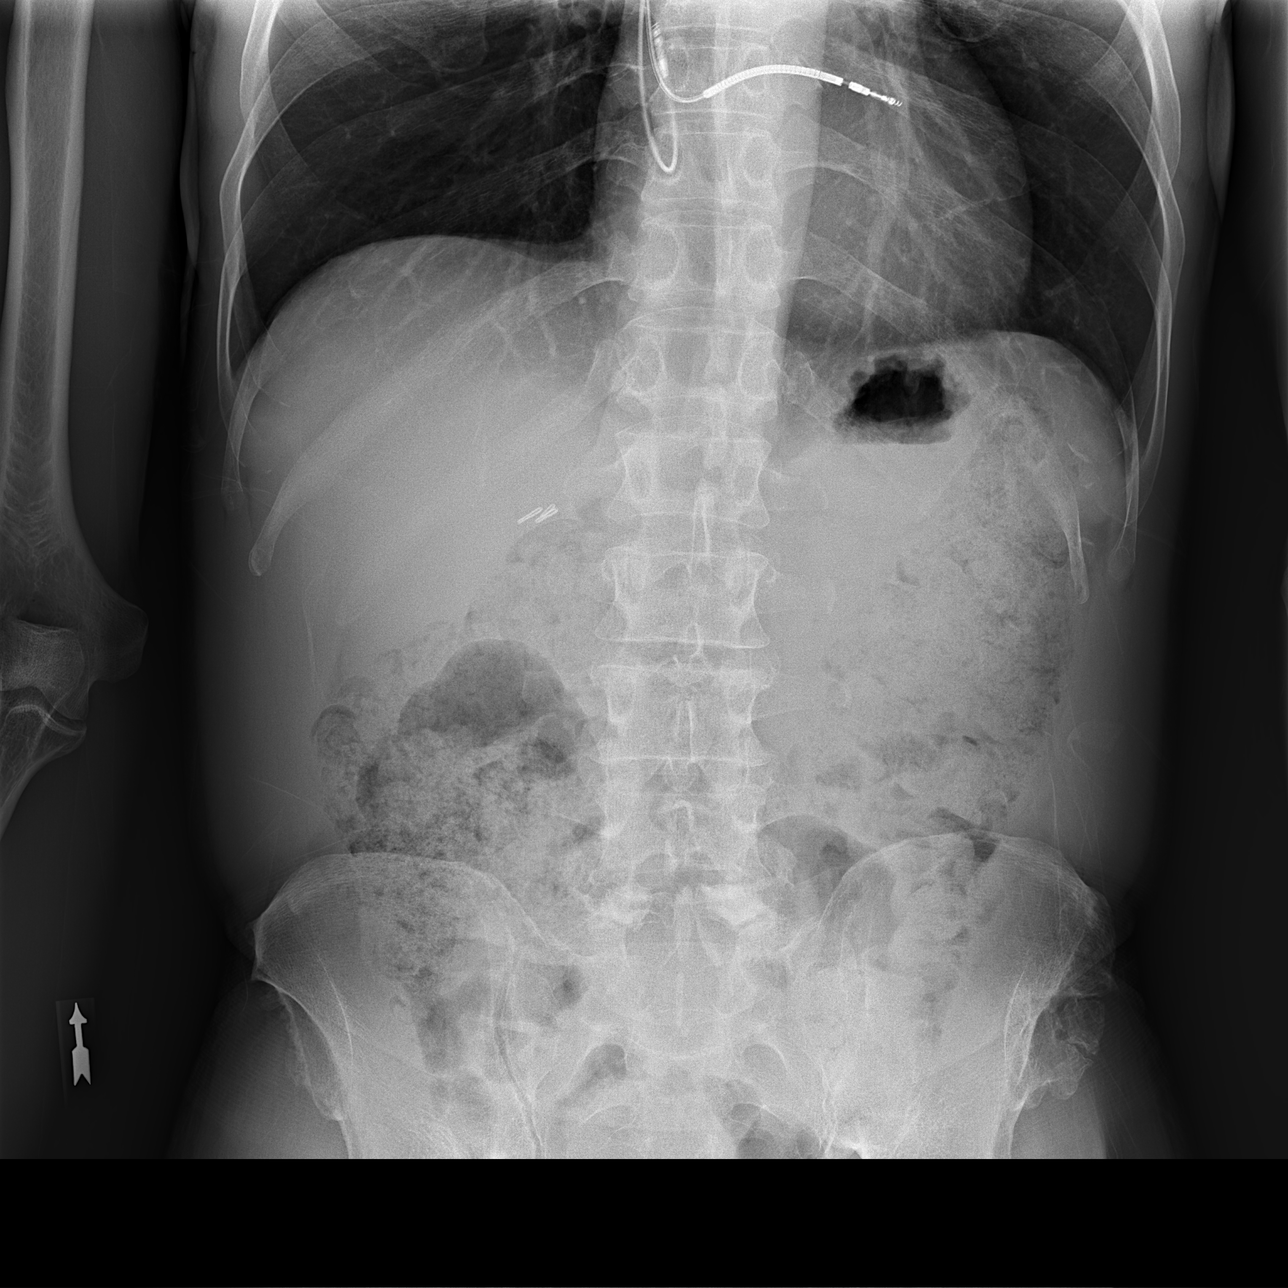

[t abdomen supine]
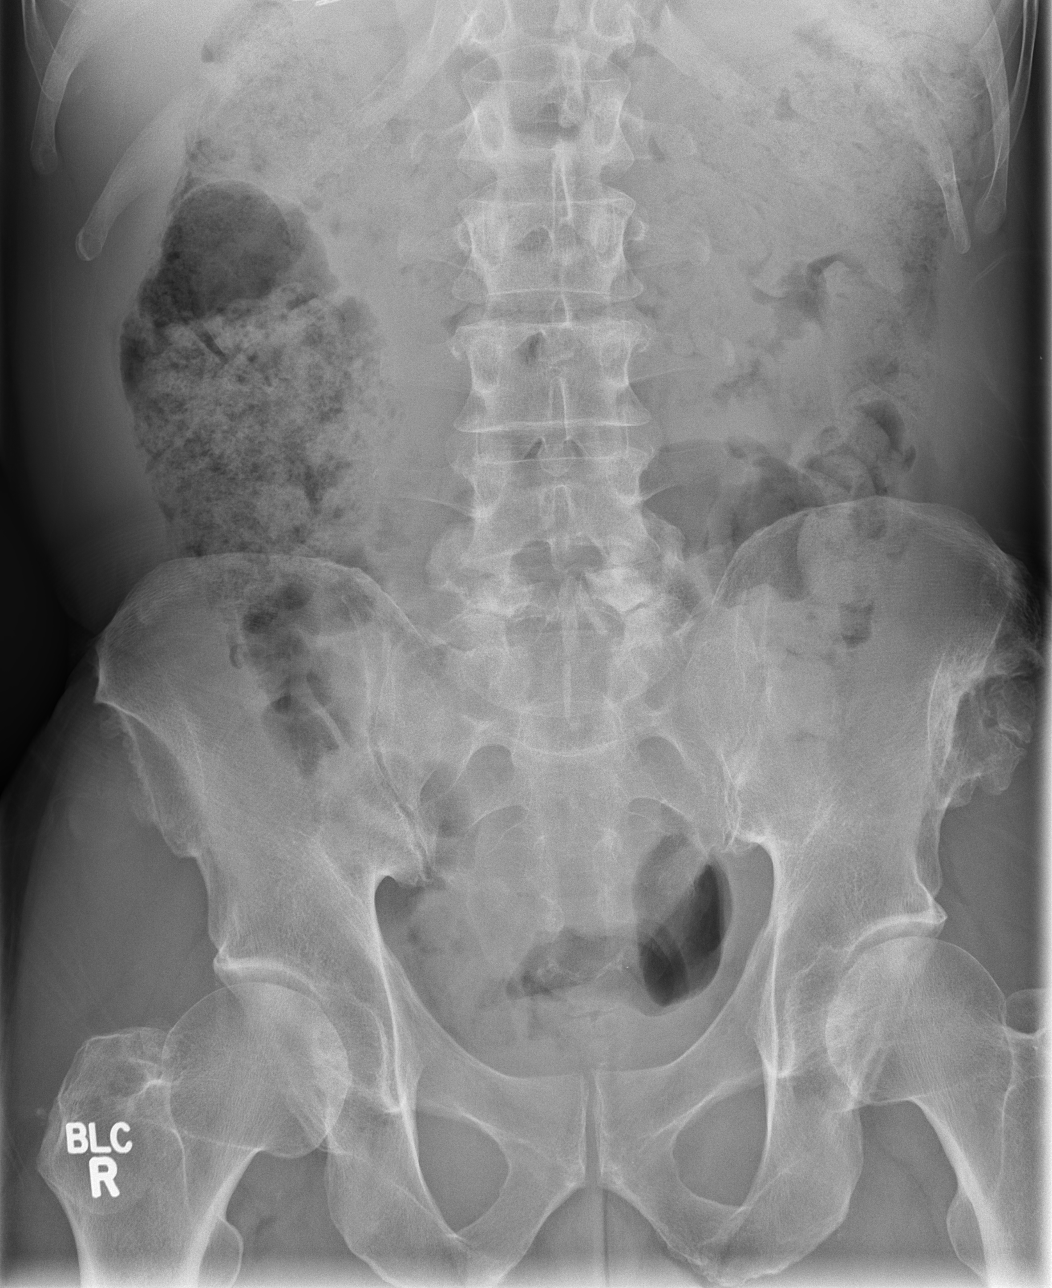

[3 of 3 positions shown; findings below may reference images not displayed]

FINDINGS: No evidence of dilated bowel loops or air fluid levels.
No evidence of free air.  Large stool burden noted.  Surgical clips
seen from prior cholecystectomy.

Both lungs are clear.  Heart size is normal.  Transvenous pacemaker
remains in appropriate position.
IMPRESSION: 1.  No acute findings.
2.  Large colonic stool burden.

## 2012-09-12 MED ORDER — PEG 3350-KCL-NA BICARB-NACL 420 G PO SOLR
4000.0000 mL | Freq: Once | ORAL | Status: AC
  Administered 2012-09-12: 4000 mL via ORAL
  Filled 2012-09-12: qty 4000

## 2012-09-12 MED ORDER — FLEET ENEMA 7-19 GM/118ML RE ENEM
1.0000 | ENEMA | Freq: Once | RECTAL | Status: AC
  Administered 2012-09-12: 1 via RECTAL
  Filled 2012-09-12: qty 1

## 2012-09-12 MED ORDER — SODIUM CHLORIDE 0.9 % IV SOLN
INTRAVENOUS | Status: DC
  Administered 2012-09-12: 21:00:00 via INTRAVENOUS

## 2012-09-12 MED ORDER — ONDANSETRON HCL 4 MG/2ML IJ SOLN
4.0000 mg | Freq: Once | INTRAMUSCULAR | Status: AC
  Administered 2012-09-12: 4 mg via INTRAVENOUS
  Filled 2012-09-12: qty 2

## 2012-09-12 NOTE — ED Provider Notes (Signed)
History     CSN: [id number]  Arrival date & time 09/12/12  4098   First MD Initiated Contact with Patient 09/12/12 2018      Chief Complaint  Patient presents with  . Constipation    (Consider location/radiation/quality/duration/timing/severity/associated sxs/prior treatment) Patient is a 51 y.o. male presenting with constipation. The history is provided by the patient.  Constipation    patient here with upper abdominal pain and vomiting x10 days. Symptoms worse at night. He's able to keep down liquids during the daytime. Seen for similar symptoms twice in the last month and diagnosed with diabetic gastroparesis. His emesis has been nonbilious. No fever noted. No urinary symptoms. Symptoms worse after he eats food. Pain is burning and localized to his epigastric area. Last bowel movement was 10 days ago. He has used suppositories without relief  Past Medical History  Diagnosis Date  . Diabetes mellitus   . Drug abuse and dependence   . CHF (congestive heart failure)     Past Surgical History  Procedure Date  . Difibula     History reviewed. No pertinent family history.  History  Substance Use Topics  . Smoking status: Current Every Day Smoker -- 1.0 packs/day    Types: Cigarettes  . Smokeless tobacco: Not on file  . Alcohol Use: No      Review of Systems  Gastrointestinal: Positive for constipation.  All other systems reviewed and are negative.    Allergies  Review of patient's allergies indicates no known allergies.  Home Medications   Current Outpatient Rx  Name  Route  Sig  Dispense  Refill  . ASPIRIN 325 MG PO TABS   Oral   Take 325 mg by mouth daily.         Marland Kitchen BISACODYL 10 MG RE SUPP   Rectal   Place 10 mg rectally as needed. For laxative         . BISACODYL 5 MG PO TBEC   Oral   Take 5 mg by mouth daily as needed. For stool softer         . CARVEDILOL 3.125 MG PO TABS   Oral   Take 3.125 mg by mouth 2 (two) times daily with a meal.        . DOCUSATE SODIUM 100 MG PO CAPS   Oral   Take 100 mg by mouth 2 (two) times daily.         . INSULIN ASPART 100 UNIT/ML [place] SOLN   Subcutaneous   Inject 3 Units into the skin 3 (three) times daily before meals.         . INSULIN GLARGINE 100 UNIT/ML [place] SOLN   Subcutaneous   Inject 32 Units into the skin at bedtime.         Marland Kitchen LISINOPRIL 2.5 MG PO TABS   Oral   Take 2.5 mg by mouth daily.         Marland Kitchen SIMVASTATIN 80 MG PO TABS   Oral   Take 80 mg by mouth at bedtime.         . SPIRONOLACTONE 25 MG PO TABS   Oral   Take 12.5 mg by mouth daily.         . WARFARIN SODIUM 2 MG PO TABS   Oral   Take 2 mg by mouth daily. 2 mg in addition to 5 mg         . WARFARIN SODIUM 5 MG PO TABS   Oral  Take 5 mg by mouth daily. 5 mg in addition to 2 mg           BP 119/83  Pulse 93  Temp 97.7 F (36.5 C) (Oral)  Resp 18  Ht 6' (1.829 m)  Wt 175 lb (79.379 kg)  BMI 23.73 kg/m2  SpO2 100%  Physical Exam  Nursing note and vitals reviewed. Constitutional: He is oriented to person, place, and time. He appears well-developed and well-nourished.  Non-toxic appearance. No distress.  HENT:  Head: Normocephalic and atraumatic.  Eyes: Conjunctivae normal, EOM and lids are normal. Pupils are equal, round, and reactive to light.  Neck: Normal range of motion. Neck supple. No tracheal deviation present. No mass present.  Cardiovascular: Normal rate, regular rhythm and normal heart sounds.  Exam reveals no gallop.   No murmur heard. Pulmonary/Chest: Effort normal and breath sounds normal. No stridor. No respiratory distress. He has no decreased breath sounds. He has no wheezes. He has no rhonchi. He has no rales.  Abdominal: Soft. Normal appearance and bowel sounds are normal. He exhibits no distension. There is tenderness in the epigastric area. There is no rigidity, no rebound, no guarding and no CVA tenderness.  Musculoskeletal: Normal range of motion. He exhibits no  edema and no tenderness.  Neurological: He is alert and oriented to person, place, and time. He has normal strength. No cranial nerve deficit or sensory deficit. GCS eye subscore is 4. GCS verbal subscore is 5. GCS motor subscore is 6.  Skin: Skin is warm and dry. No abrasion and no rash noted.  Psychiatric: He has a normal mood and affect. His speech is normal and behavior is normal.    ED Course  Procedures (including critical care time)  Labs Reviewed - No data to display No results found.   No diagnosis found.    MDM  pts xrays shows sereve constipation, rectal exam without stool in vault, pt given enema without response, given golytely and dischaged with f/u instructions       Toy Baker, MD 09/18/12 [phone]

## 2012-09-12 NOTE — ED Notes (Addendum)
Patient c/o upper abdominal pain and vomiting.  Patient c/o burning pain of 10/10 and no bowel movement x 10 days.  Patient has been in the ER 3 times in the last month for the same.  Patient denies fevers.  Patient's pain worsens with food intake.

## 2012-09-12 NOTE — ED Notes (Signed)
RN to obtain labs with start of IV 

## 2012-09-14 ENCOUNTER — Ambulatory Visit (INDEPENDENT_AMBULATORY_CARE_PROVIDER_SITE_OTHER): Payer: Medicare Other | Admitting: Gastroenterology

## 2012-09-14 ENCOUNTER — Encounter: Payer: Self-pay | Admitting: Gastroenterology

## 2012-09-14 VITALS — BP 106/80 | HR 97 | Ht 72.0 in | Wt 169.0 lb

## 2012-09-14 DIAGNOSIS — J4489 Other specified chronic obstructive pulmonary disease: Secondary | ICD-10-CM

## 2012-09-14 DIAGNOSIS — IMO0001 Reserved for inherently not codable concepts without codable children: Secondary | ICD-10-CM

## 2012-09-14 DIAGNOSIS — E119 Type 2 diabetes mellitus without complications: Secondary | ICD-10-CM

## 2012-09-14 DIAGNOSIS — Z95810 Presence of automatic (implantable) cardiac defibrillator: Secondary | ICD-10-CM

## 2012-09-14 DIAGNOSIS — R1013 Epigastric pain: Secondary | ICD-10-CM

## 2012-09-14 DIAGNOSIS — R110 Nausea: Secondary | ICD-10-CM

## 2012-09-14 DIAGNOSIS — J449 Chronic obstructive pulmonary disease, unspecified: Secondary | ICD-10-CM

## 2012-09-14 MED ORDER — ESOMEPRAZOLE MAGNESIUM 40 MG PO CPDR: 40.0000 mg | DELAYED_RELEASE_CAPSULE | Freq: Two times a day (BID) | ORAL | Status: DC

## 2012-09-14 MED ORDER — ESOMEPRAZOLE MAGNESIUM 40 MG PO CPDR: 40.0000 mg | DELAYED_RELEASE_CAPSULE | Freq: Every day | ORAL | Status: DC

## 2012-09-14 NOTE — Patient Instructions (Addendum)
You have been given a separate informational sheet regarding your tobacco use, the importance of quitting and local resources to help you quit.  Please take Nexium one capsule by mouth twice daily. Samples were given today. If this works well for you please call us back and we can send in a prescription.  Your physician has requested that you go to the basement for lab work before leaving today.  You have been scheduled for an endoscopy with propofol 09-15-2012. Please follow written instructions given to you at your visit today. If you use inhalers (even only as needed) or a CPAP machine, please bring them with you on the day of your procedure.

## 2012-09-14 NOTE — Progress Notes (Signed)
History of Present Illness:  This is a very nice 51 year old African American male with type II insulin-dependent diabetes which is under poor control, mild emphysema with a long history of smoking, history of mild cardiomyopathy who is chronically on Coumadin and has a defibrillator in place area he is followed by cardiology at [facility].  He denies current cardiovascular problems.  He also is status post cholecystectomy.  He is been in the emergency room on several occasions over the last month with a constant burning substernal-epigastric discomfort partially relieved by by mouth Carafate.  This pain awakens him approximately 3:00 in the morning, and is associated with some emesis of clear fluid.  He denies melena, hematochezia, but has had constipation associated with decreased by mouth intake.  Several emergency room visits are reviewed and he had a normal CT scan and labs.  Also chest x-ray showed no evidence of significant heart failure.  He has not had previous endoscopy or colonoscopy.  Recently he was treated with a colonoscopy prep because of his constipation and had good results.  Is on a variety of medications listed and reviewed his chart.  He denies use of NSAIDs, alcohol, but does smoke, and takes aspirin 325 mg a day.  There is no history of known pancreatitis, dysphagia, light-colored stools, dark urine, icterus, fever or chills.  I have reviewed this patient's present history, medical and surgical past history, allergies and medications.     ROS: The remainder of the 10 point ROS is negative... to me he denies any cardiovascular or pulmonary complaints at this time.  He denies lower abdominal hernia specific hepatobiliary complaints.  It is unclear to me who manages his diabetes, but it sounds like most of his medical care is provided at [facility] in [place].     Physical Exam: Blood pressure 106/80, pulse 97, weight 169 pounds, BMI 22.9, and oxygen saturation  97% General well developed well nourished patient in no acute distress, appearing their stated age Eyes PERRLA, no icterus, fundoscopic exam per opthamologist Skin no lesions noted Neck supple, no adenopathy, no thyroid enlargement, no tenderness,no JVD  Heart no significant murmurs, gallops or rubs noted Abdomen no hepatosplenomegaly masses or tenderness, BS normal.  Rectal inspection normal no fissures, or fistulae noted.  No masses or tenderness on digital exam. Stool guaiac negative. Extremities no acute joint lesions, edema, phlebitis or evidence of cellulitis. Neurologic patient oriented x 3, cranial nerves intact, no focal neurologic deficits noted. Psychological mental status normal and normal affect.  Assessment and plan: Epigastric pain over the last month and a 51 year old patient with multiple medical problems, ASA class III.  He currently is well compensated from a cardiovascular and pulmonary standpoint, but I am not sure that his diabetes is well controlled.  He probably has an acute peptic ulcer, and perhaps partial gastric outlet obstruction.  We will proceed with endoscopy ASAP while continuing Coumadin.  He will need colonoscopy at some point with adjustment in his Coumadin doses.  We also will make adjustments in his insulin for his procedures.  I placed him on Nexium 40 mg twice a day, and screening labs drawn for review.  He also will check repeat amylase, lipase, and liver profile.  On chart review there also is some history of drug abuse, but I cannot see documentation of any chronic liver disease or chronic hepatitis.  This patient probably needs to be established with primary care for ongoing management of his multiple medical problems.  No diagnosis  found.

## 2012-09-15 ENCOUNTER — Ambulatory Visit (AMBULATORY_SURGERY_CENTER): Payer: Medicare Other | Admitting: Gastroenterology

## 2012-09-15 ENCOUNTER — Encounter: Payer: Self-pay | Admitting: Gastroenterology

## 2012-09-15 VITALS — BP 127/100 | HR 75 | Temp 97.7°F | Resp 14 | Ht 72.0 in | Wt 169.0 lb

## 2012-09-15 DIAGNOSIS — R110 Nausea: Secondary | ICD-10-CM

## 2012-09-15 DIAGNOSIS — R1013 Epigastric pain: Secondary | ICD-10-CM

## 2012-09-15 LAB — GLUCOSE, CAPILLARY
Glucose-Capillary: 270 mg/dL — ABNORMAL HIGH (ref 70–99)
Glucose-Capillary: 233 mg/dL — ABNORMAL HIGH (ref 70–99)

## 2012-09-15 MED ORDER — SODIUM CHLORIDE 0.9 % IV SOLN: 500.0000 mL | INTRAVENOUS | Status: DC

## 2012-09-15 NOTE — Op Note (Signed)
[facility] [address] [place], [address]   ENDOSCOPY PROCEDURE REPORT  PATIENT: Dennis [name], Dennis [name]  MR#: [id number] BIRTHDATE: 07-19-1961 , 51  yrs. old GENDER: Male ENDOSCOPIST:David Hale Bogus, MD, [facility] REFERRED BY: PROCEDURE DATE:  09/15/2012 PROCEDURE:   EGD, diagnostic ASA CLASS:    Class III INDICATIONS: abdominal pain. MEDICATION: propofol (Diprivan) 100mg  IV TOPICAL ANESTHETIC:   Cetacaine Spray  DESCRIPTION OF PROCEDURE:   After the risks and benefits of the procedure were explained, informed consent was obtained.  The LB GIF-H180 [id number]  endoscope was introduced through the mouth  and advanced to the second portion of the duodenum .  The instrument was slowly withdrawn as the mucosa was fully examined.    The upper, middle and distal third of the esophagus were carefully inspected and no abnormalities were noted.  The z-line was well seen at the GEJ.  The endoscope was pushed into the fundus which was normal including a retroflexed view.  The antrum, gastric body, first and second part of the duodenum were unremarkable. Retroflexed views revealed no abnormalities.    The scope was then withdrawn from the patient and the procedure completed.  COMPLICATIONS: There were no complications.   ENDOSCOPIC IMPRESSION:normal exam...no cause for abdominal pain...probably from poor diabetic control,CT scan is normal,labs are normal...needs i care f/u,continue emperic PPi,,,no narcotics indicated. Normal EGD  RECOMMENDATIONS:i care for DM control,continue PPi    _______________________________ eSigned:  Mardella Layman, MD, [facility] 09/15/2012 10:24 AM   standard discharge

## 2012-09-15 NOTE — Patient Instructions (Addendum)

## 2012-09-15 NOTE — Progress Notes (Signed)
1009 PHENYLEPHRINE 200 MCQ. TOL WELL

## 2012-09-15 NOTE — Progress Notes (Signed)
Patient did not have preoperative order for IV antibiotic SSI prophylaxis. (G8918) Patient did not experience any of the following events: a burn prior to discharge; a fall within the facility; wrong site/side/patient/procedure/implant event; or a hospital transfer or hospital admission upon discharge from the facility. (G8907)   Charted by April Mirts RN 

## 2012-09-16 ENCOUNTER — Telehealth: Payer: Self-pay

## 2012-09-16 NOTE — Telephone Encounter (Signed)
  Follow up Call-  Call back number 09/15/2012  Post procedure Call Back phone  # [phone]  Permission to leave phone message Yes     Patient questions:  Do you have a fever, pain , or abdominal swelling? yes Pain Score  3 *  Have you tolerated food without any problems? yes  Have you been able to return to your normal activities? yes  Do you have any questions about your discharge instructions: Diet   no Medications  no Follow up visit  no  Do you have questions or concerns about your Care? yes Still "up last night with same thing; I'm just at a loss as to what is going on".  Advised to monitor and return to PCP as needed.  Continue PPI. Actions: * If pain score is 4 or above: No action needed, pain <4.

## 2013-03-04 DIAGNOSIS — I10 Essential (primary) hypertension: Secondary | ICD-10-CM | POA: Insufficient documentation

## 2013-08-09 ENCOUNTER — Emergency Department (HOSPITAL_COMMUNITY): Payer: Medicare Other

## 2013-08-09 ENCOUNTER — Observation Stay (HOSPITAL_COMMUNITY)
Admission: EM | Admit: 2013-08-09 | Discharge: 2013-08-10 | Disposition: A | Discharge status: Home / Self Care | Payer: Medicare Other | Attending: Infectious Diseases | Admitting: Infectious Diseases

## 2013-08-09 ENCOUNTER — Encounter (HOSPITAL_COMMUNITY): Payer: Self-pay | Admitting: Emergency Medicine

## 2013-08-09 DIAGNOSIS — F1410 Cocaine abuse, uncomplicated: Secondary | ICD-10-CM

## 2013-08-09 DIAGNOSIS — E1139 Type 2 diabetes mellitus with other diabetic ophthalmic complication: Secondary | ICD-10-CM | POA: Diagnosis present

## 2013-08-09 DIAGNOSIS — E109 Type 1 diabetes mellitus without complications: Secondary | ICD-10-CM | POA: Insufficient documentation

## 2013-08-09 DIAGNOSIS — IMO0001 Reserved for inherently not codable concepts without codable children: Secondary | ICD-10-CM

## 2013-08-09 DIAGNOSIS — Z7982 Long term (current) use of aspirin: Secondary | ICD-10-CM | POA: Insufficient documentation

## 2013-08-09 DIAGNOSIS — Z794 Long term (current) use of insulin: Secondary | ICD-10-CM | POA: Insufficient documentation

## 2013-08-09 DIAGNOSIS — F1420 Cocaine dependence, uncomplicated: Secondary | ICD-10-CM | POA: Insufficient documentation

## 2013-08-09 DIAGNOSIS — E1149 Type 2 diabetes mellitus with other diabetic neurological complication: Secondary | ICD-10-CM

## 2013-08-09 DIAGNOSIS — Z95810 Presence of automatic (implantable) cardiac defibrillator: Secondary | ICD-10-CM | POA: Insufficient documentation

## 2013-08-09 DIAGNOSIS — Z79899 Other long term (current) drug therapy: Secondary | ICD-10-CM | POA: Insufficient documentation

## 2013-08-09 DIAGNOSIS — F1920 Other psychoactive substance dependence, uncomplicated: Secondary | ICD-10-CM | POA: Diagnosis present

## 2013-08-09 DIAGNOSIS — Z9119 Patient's noncompliance with other medical treatment and regimen: Secondary | ICD-10-CM

## 2013-08-09 DIAGNOSIS — I509 Heart failure, unspecified: Secondary | ICD-10-CM

## 2013-08-09 DIAGNOSIS — Z7901 Long term (current) use of anticoagulants: Secondary | ICD-10-CM | POA: Insufficient documentation

## 2013-08-09 DIAGNOSIS — F17200 Nicotine dependence, unspecified, uncomplicated: Secondary | ICD-10-CM

## 2013-08-09 DIAGNOSIS — R079 Chest pain, unspecified: Secondary | ICD-10-CM

## 2013-08-09 DIAGNOSIS — R739 Hyperglycemia, unspecified: Secondary | ICD-10-CM

## 2013-08-09 DIAGNOSIS — R0789 Other chest pain: Principal | ICD-10-CM | POA: Insufficient documentation

## 2013-08-09 HISTORY — DX: Cardiac murmur, unspecified: R01.1

## 2013-08-09 HISTORY — DX: Sleep apnea, unspecified: G47.30

## 2013-08-09 HISTORY — DX: Presence of automatic (implantable) cardiac defibrillator: Z95.810

## 2013-08-09 LAB — CBC
HCT: 41.7 % (ref 39.0–52.0)
Hemoglobin: 13.8 g/dL (ref 13.0–17.0)
MCH: 31.4 pg (ref 26.0–34.0)
MCHC: 33.1 g/dL (ref 30.0–36.0)
MCV: 95 fL (ref 78.0–100.0)
Platelets: 207 10*3/uL (ref 150–400)
RBC: 4.39 MIL/uL (ref 4.22–5.81)
RDW: 12.3 % (ref 11.5–15.5)
WBC: 8.3 10*3/uL (ref 4.0–10.5)

## 2013-08-09 LAB — COMPREHENSIVE METABOLIC PANEL
ALT: 11 U/L (ref 0–53)
AST: 13 U/L (ref 0–37)
Albumin: 3.6 g/dL (ref 3.5–5.2)
Alkaline Phosphatase: 113 U/L (ref 39–117)
BUN: 16 mg/dL (ref 6–23)
CO2: 26 mEq/L (ref 19–32)
Calcium: 9 mg/dL (ref 8.4–10.5)
Chloride: 92 mEq/L — ABNORMAL LOW (ref 96–112)
Creatinine, Ser: 1.08 mg/dL (ref 0.50–1.35)
GFR calc Af Amer: >90 mL/min (ref >90)
GFR calc non Af Amer: 78 mL/min — ABNORMAL LOW (ref >90)
Glucose, Bld: 681 mg/dL (ref 70–99)
Potassium: 4.2 mEq/L (ref 3.5–5.1)
Sodium: 129 mEq/L — ABNORMAL LOW (ref 135–145)
Total Bilirubin: 0.3 mg/dL (ref 0.3–1.2)
Total Protein: 6.7 g/dL (ref 6.0–8.3)

## 2013-08-09 LAB — GLUCOSE, CAPILLARY
Glucose-Capillary: >600 mg/dL (ref 70–99)
Glucose-Capillary: 270 mg/dL — ABNORMAL HIGH (ref 70–99)
Glucose-Capillary: 352 mg/dL — ABNORMAL HIGH (ref 70–99)

## 2013-08-09 LAB — RAPID URINE DRUG SCREEN, HOSP PERFORMED
Amphetamines: NOT DETECTED
Barbiturates: NOT DETECTED
Benzodiazepines: NOT DETECTED
Cocaine: POSITIVE — AB
Opiates: NOT DETECTED
Tetrahydrocannabinol: NOT DETECTED

## 2013-08-09 LAB — HEMOGLOBIN A1C
Hgb A1c MFr Bld: 12.9 % — ABNORMAL HIGH (ref <5.7)
Mean Plasma Glucose: 324 mg/dL — ABNORMAL HIGH (ref <117)

## 2013-08-09 LAB — PROTIME-INR
INR: 1.01 (ref 0.00–1.49)
Prothrombin Time: 13.1 seconds (ref 11.6–15.2)

## 2013-08-09 LAB — POCT I-STAT TROPONIN I: Troponin i, poc: 0.01 ng/mL (ref 0.00–0.08)

## 2013-08-09 LAB — TROPONIN I
Troponin I: <0.3 ng/mL (ref <0.30)
Troponin I: <0.3 ng/mL (ref <0.30)

## 2013-08-09 LAB — TSH: TSH: 0.581 u[IU]/mL (ref 0.350–4.500)

## 2013-08-09 IMAGING — CR DG CHEST 2V
2 series · 2 of 2 positions shown · non-contrast
Comparison: [DATE]

CLINICAL DATA: Pain and defibrillator site

EXAM:
CHEST  2 VIEW

[w chest pa]
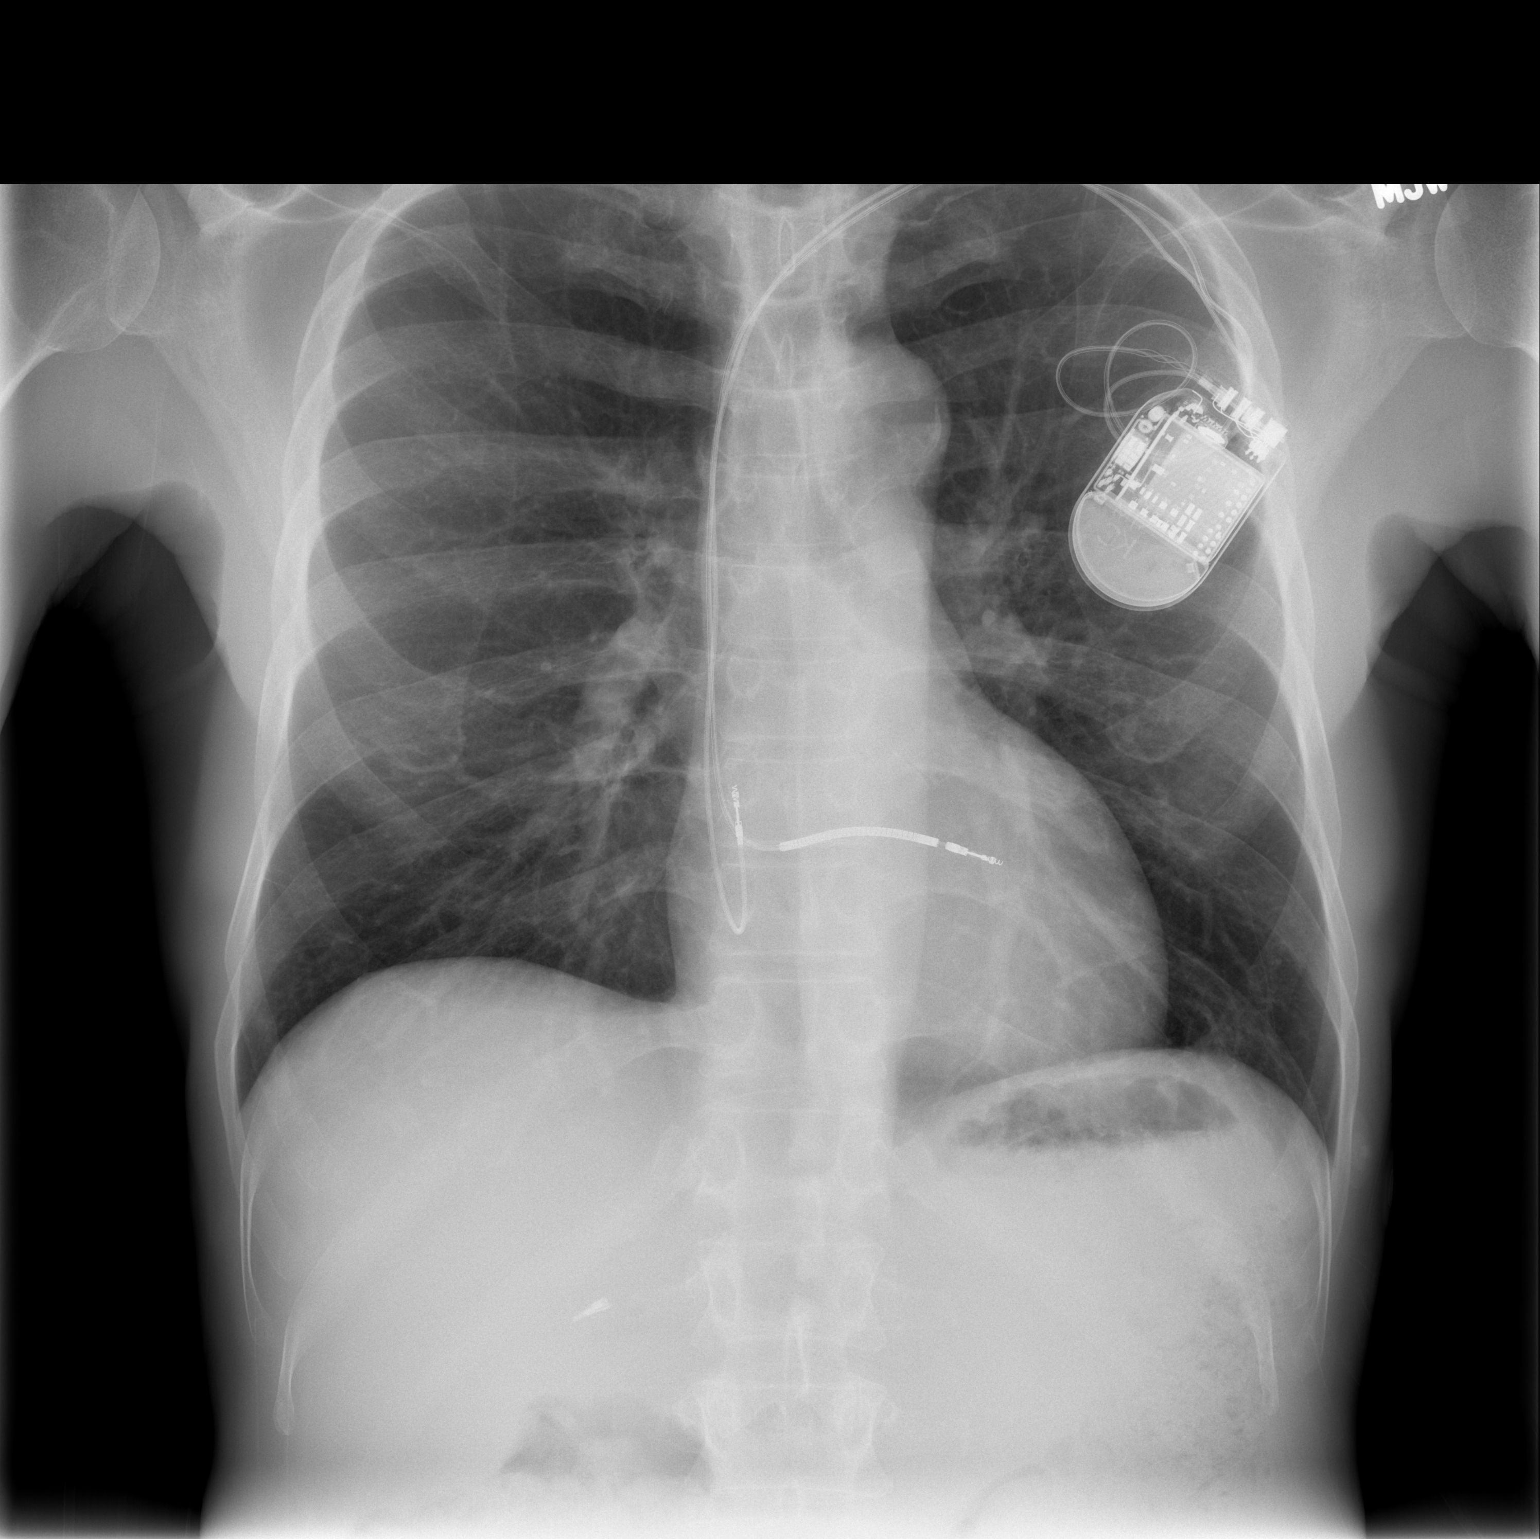

[w chest lat]
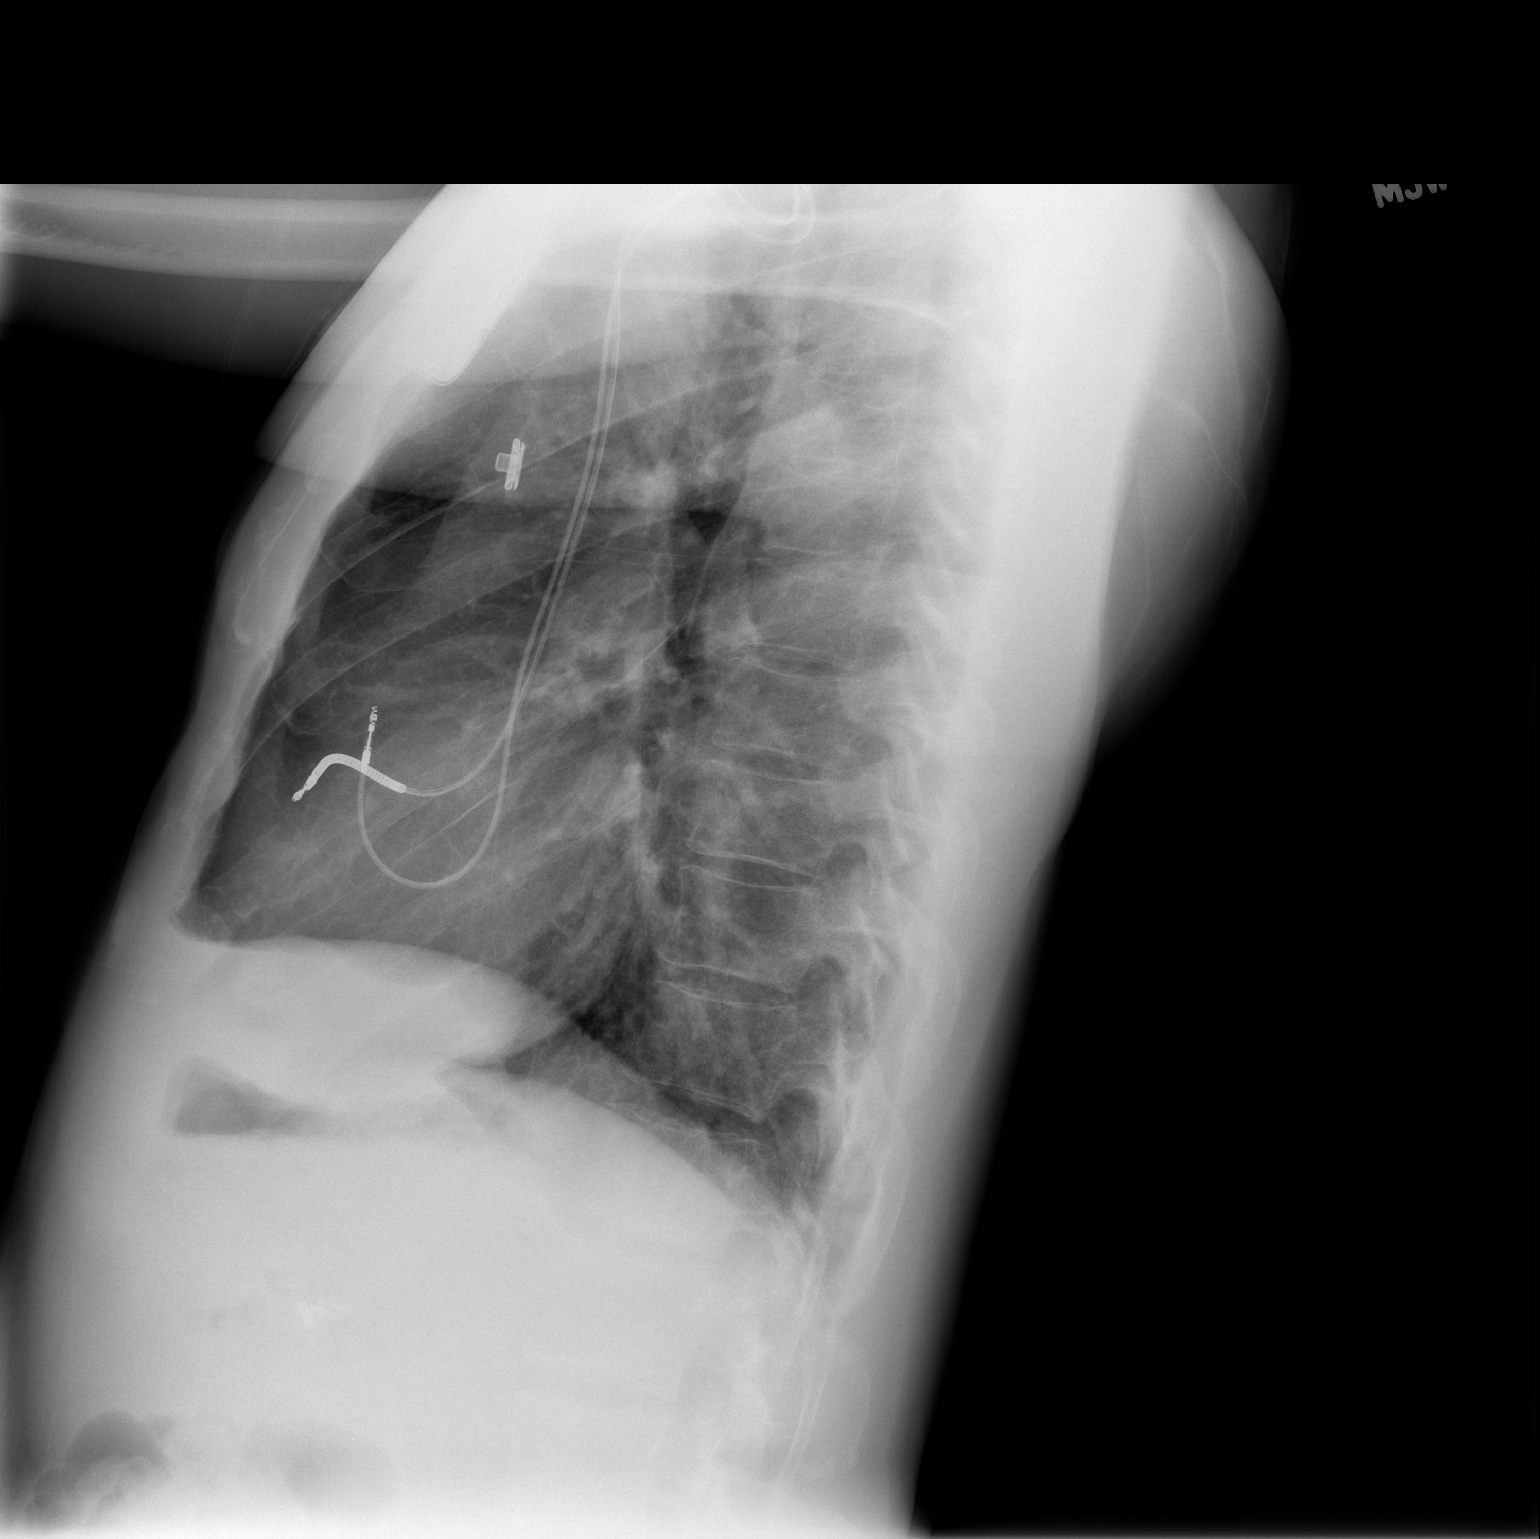

[2 of 2 positions shown; findings below may reference images not displayed]

FINDINGS: A defibrillator is again seen. The cardiac shadow is stable. The
lungs are clear bilaterally. No acute infiltrate is seen. No bony
abnormality is noted.
IMPRESSION: No active cardiopulmonary disease.

## 2013-08-09 MED ORDER — WARFARIN SODIUM 6 MG PO TABS
7.0000 mg | ORAL_TABLET | Freq: Once | ORAL | Status: AC
  Administered 2013-08-09: 7 mg via ORAL
  Filled 2013-08-09: qty 1

## 2013-08-09 MED ORDER — INSULIN ASPART 100 UNIT/ML ~~LOC~~ SOLN
0.0000 [IU] | SUBCUTANEOUS | Status: DC
  Administered 2013-08-09: 2 [IU] via SUBCUTANEOUS
  Administered 2013-08-09: 15 [IU] via SUBCUTANEOUS
  Administered 2013-08-10: 8 [IU] via SUBCUTANEOUS
  Administered 2013-08-10: 2 [IU] via SUBCUTANEOUS

## 2013-08-09 MED ORDER — SODIUM CHLORIDE 0.9 % IV SOLN: 250.0000 mL | INTRAVENOUS | Status: DC | PRN

## 2013-08-09 MED ORDER — SPIRONOLACTONE 12.5 MG HALF TABLET
12.5000 mg | ORAL_TABLET | Freq: Every day | ORAL | Status: DC
  Administered 2013-08-09 – 2013-08-10 (×2): 12.5 mg via ORAL
  Filled 2013-08-09 (×2): qty 1

## 2013-08-09 MED ORDER — ACETAMINOPHEN 325 MG PO TABS: 650.0000 mg | ORAL_TABLET | Freq: Four times a day (QID) | ORAL | Status: DC | PRN

## 2013-08-09 MED ORDER — ASPIRIN 325 MG PO TABS
325.0000 mg | ORAL_TABLET | Freq: Every day | ORAL | Status: DC
  Administered 2013-08-10: 325 mg via ORAL
  Filled 2013-08-09: qty 1

## 2013-08-09 MED ORDER — ATORVASTATIN CALCIUM 40 MG PO TABS
40.0000 mg | ORAL_TABLET | Freq: Every day | ORAL | Status: DC
  Administered 2013-08-09: 40 mg via ORAL
  Filled 2013-08-09 (×2): qty 1

## 2013-08-09 MED ORDER — MORPHINE SULFATE 2 MG/ML IJ SOLN: 2.0000 mg | INTRAMUSCULAR | Status: DC | PRN

## 2013-08-09 MED ORDER — HEPARIN SODIUM (PORCINE) 5000 UNIT/ML IJ SOLN
5000.0000 [IU] | Freq: Three times a day (TID) | INTRAMUSCULAR | Status: DC
  Filled 2013-08-09 (×6): qty 1

## 2013-08-09 MED ORDER — INSULIN GLARGINE 100 UNIT/ML ~~LOC~~ SOLN
32.0000 [IU] | Freq: Every day | SUBCUTANEOUS | Status: DC
  Administered 2013-08-09: 32 [IU] via SUBCUTANEOUS
  Filled 2013-08-09 (×2): qty 0.32

## 2013-08-09 MED ORDER — NITROGLYCERIN 0.4 MG SL SUBL: 0.4000 mg | SUBLINGUAL_TABLET | SUBLINGUAL | Status: DC | PRN

## 2013-08-09 MED ORDER — CARVEDILOL 3.125 MG PO TABS
3.1250 mg | ORAL_TABLET | Freq: Two times a day (BID) | ORAL | Status: DC
Start: 2013-08-09 — End: 2013-08-10
  Administered 2013-08-09 – 2013-08-10 (×2): 3.125 mg via ORAL
  Filled 2013-08-09 (×4): qty 1

## 2013-08-09 MED ORDER — LISINOPRIL 2.5 MG PO TABS
2.5000 mg | ORAL_TABLET | Freq: Every day | ORAL | Status: DC
Start: 2013-08-09 — End: 2013-08-10
  Administered 2013-08-09 – 2013-08-10 (×2): 2.5 mg via ORAL
  Filled 2013-08-09 (×2): qty 1

## 2013-08-09 MED ORDER — ACETAMINOPHEN 650 MG RE SUPP: 650.0000 mg | Freq: Four times a day (QID) | RECTAL | Status: DC | PRN

## 2013-08-09 MED ORDER — ASPIRIN 81 MG PO CHEW
324.0000 mg | CHEWABLE_TABLET | Freq: Once | ORAL | Status: AC
  Administered 2013-08-09: 324 mg via ORAL
  Filled 2013-08-09: qty 4

## 2013-08-09 MED ORDER — INSULIN ASPART 100 UNIT/ML ~~LOC~~ SOLN
10.0000 [IU] | Freq: Once | SUBCUTANEOUS | Status: AC
  Administered 2013-08-09: 10 [IU] via INTRAVENOUS
  Filled 2013-08-09: qty 1

## 2013-08-09 MED ORDER — SODIUM CHLORIDE 0.9 % IJ SOLN
3.0000 mL | Freq: Two times a day (BID) | INTRAMUSCULAR | Status: DC
  Administered 2013-08-09 – 2013-08-10 (×3): 3 mL via INTRAVENOUS

## 2013-08-09 MED ORDER — SODIUM CHLORIDE 0.9 % IJ SOLN: 3.0000 mL | INTRAMUSCULAR | Status: DC | PRN

## 2013-08-09 MED ORDER — SODIUM CHLORIDE 0.9 % IJ SOLN: 3.0000 mL | Freq: Two times a day (BID) | INTRAMUSCULAR | Status: DC

## 2013-08-09 MED ORDER — WARFARIN - PHARMACIST DOSING INPATIENT: Freq: Every day | Status: DC

## 2013-08-09 NOTE — ED Notes (Signed)
Pt reports having pain around defib site since last night, it woke him up from sleep several times throughout the night. Describes it as a "pulling pain." reports feeling sob and feeling lightheaded. ekg done at triage. Airway intact.

## 2013-08-09 NOTE — Consult Note (Signed)
CARDIOLOGY CONSULT NOTE   Patient ID: Dennis [name] MRN: [id number] DOB/AGE: July 16, 1961 51 y.o.  Admit date: 08/09/2013  Primary Physician   [name], MD Primary Cardiologist   New, prev followed at [facility] Reason for Consultation  Chest pain    [name] is a 52 y.o. male with  history of CAD.  Atypical pain this am  Felt pulling around defibrillator site.  Pain somewhat positional Gone now Enzymes negative  ECG NSR no acute changes LVH and early repolarization.  Sees Dr [name] has DCM with known collatealized LAD.  No signs of CHF.  No dyspnea or pleurisy.  No edema.  AICD last checked at [facility] device clinic 5 months ago and normal.  Currently asymptomatic.  He is an insulin dependant diabetic and has not taken any in 5 weeks. Had urinary frequency and blurry vision.  BS over 600 on admission.  Also not taking coumadin.  ? Indication  No TIA or afib history .  DCM diagnosed about 2 years ago.     Past Medical History  Diagnosis Date  . Diabetes mellitus   . CHF (congestive heart failure)   . Drug abuse and dependence      Past Surgical History  Procedure Laterality Date  . Difibula    . Cholecystectomy  1994  . Orif arm    . Cardiac defibrillator placement      No Known Allergies  I have reviewed the patient's current medications . [START ON 08/10/2013] aspirin  325 mg Oral Daily  . atorvastatin  40 mg Oral q1800  . carvedilol  3.125 mg Oral BID WC  . heparin  5,000 Units Subcutaneous Q8H  . insulin aspart  0-15 Units Subcutaneous Q4H  . insulin glargine  32 Units Subcutaneous QHS  . lisinopril  2.5 mg Oral Daily  . sodium chloride  3 mL Intravenous Q12H  . sodium chloride  3 mL Intravenous Q12H  . spironolactone  12.5 mg Oral Daily  . warfarin  7 mg Oral ONCE-1800  . Warfarin - Pharmacist Dosing Inpatient   Does not apply q1800     sodium chloride, acetaminophen, acetaminophen, morphine injection, nitroGLYCERIN, sodium chloride  Prior to  Admission medications   Medication Sig Start Date End Date Taking? Authorizing Provider  aspirin 325 MG tablet Take 325 mg by mouth daily.   Yes Historical Provider, MD  carvedilol (COREG) 3.125 MG tablet Take 3.125 mg by mouth 2 (two) times daily with a meal.   Yes Historical Provider, MD  docusate sodium (COLACE) 100 MG capsule Take 100 mg by mouth 2 (two) times daily.   Yes Historical Provider, MD  esomeprazole (NEXIUM) 40 MG capsule Take 1 capsule (40 mg total) by mouth 2 (two) times daily. 09/14/12 09/14/13 Yes Mardella Layman, MD  insulin aspart (NOVOLOG) 100 UNIT/ML injection Inject 3 Units into the skin 3 (three) times daily before meals.   Yes Historical Provider, MD  insulin glargine (LANTUS) 100 UNIT/ML injection Inject 32 Units into the skin at bedtime.   Yes Historical Provider, MD  lisinopril (PRINIVIL,ZESTRIL) 2.5 MG tablet Take 2.5 mg by mouth daily.   Yes Historical Provider, MD  simvastatin (ZOCOR) 80 MG tablet Take 80 mg by mouth at bedtime.   Yes Historical Provider, MD  spironolactone (ALDACTONE) 25 MG tablet Take 12.5 mg by mouth daily.   Yes Historical Provider, MD  warfarin (COUMADIN) 2 MG tablet Take 2 mg by mouth daily. 2 mg in addition to  5 mg   Yes Historical Provider, MD  warfarin (COUMADIN) 5 MG tablet Take 5 mg by mouth daily. 5 mg in addition to 2 mg   Yes Historical Provider, MD  QC PEN NEEDLES 31G X 6 MM MISC  08/06/12   Historical Provider, MD     History   Social History  . Marital Status: Single    Spouse Name: N/A    Number of Children: 1  . Years of Education: N/A   Occupational History  . Disabled    Social History Main Topics  . Smoking status: Current Every Day Smoker -- 0.50 packs/day    Types: Cigarettes  . Smokeless tobacco: Never Used  . Alcohol Use: No  . Drug Use: No  . Sexual Activity: Not on file   Other Topics Concern  . Not on file   Social History Narrative  . No narrative on file    Family Status  Relation Status Death  Age  . Mother Alive   . Father Alive    Family History  Problem Relation Age of Onset  . Diabetes Mother   . Heart disease Mother   . Diabetes Father   . Prostate cancer Father   . Heart disease Father   . Diabetes Brother   . Diabetes Brother     ROS:  Full 14 point review of systems complete and found to be negative unless listed above.  Physical Exam: Blood pressure 110/74, pulse 81, temperature 98.4 F (36.9 C), temperature source Oral, resp. rate 18, height 6' (1.829 m), weight 155 lb (70.308 kg), SpO2 96.00%.  General: Well developed, well nourished, male in no acute distress Head: Eyes PERRLA, No xanthomas.   Normocephalic and atraumatic, oropharynx without edema or exudate. Dentition:  Lungs:  Clear no wheezing  Heart: HRRR S1 S2, no rub/gallop, Heart irregular rate and rhythm with S1, S2  murmur. pulses are 2+ extrem.   Neck: No carotid bruits. No lymphadenopathy.  JVD. Abdomen: Bowel sounds present, abdomen soft and non-tender without masses or hernias noted. Msk:  No spine or cva tenderness. No weakness, no joint deformities or effusions. Extremities: No clubbing or cyanosis.  edema.  Neuro: Alert and oriented X 3. No focal deficits noted. Psych:  Good affect, responds appropriately Skin: No rashes or lesions noted. AICD under left clavicle with no swelling or pain   Labs:  Lab Results  Component Value Date   WBC 8.3 08/09/2013   HGB 13.8 08/09/2013   HCT 41.7 08/09/2013   MCV 95.0 08/09/2013   PLT 207 08/09/2013    Recent Labs  08/09/13 0840  INR 1.01    Recent Labs Lab 08/09/13 0840  NA 129*  K 4.2  CL 92*  CO2 26  BUN 16  CREATININE 1.08  CALCIUM 9.0  PROT 6.7  BILITOT 0.3  ALKPHOS 113  ALT 11  AST 13  GLUCOSE 681*  ALBUMIN 3.6    Recent Labs  08/09/13 1215  TROPONINI <0.30    Recent Labs  08/09/13 0850  TROPIPOC 0.01   ECG:  08/09/2013 Vent. rate 84 BPM PR interval 192 ms QRS duration 92 ms QT/QTc 378/446 ms P-R-T axes  88 58 90  Radiology:  Dg Chest 2 View 08/09/2013   CLINICAL DATA:  Pain and defibrillator site  EXAM: CHEST  2 VIEW  COMPARISON:  09/12/2012  FINDINGS: A defibrillator is again seen. The cardiac shadow is stable. The lungs are clear bilaterally. No acute infiltrate is seen. No bony  abnormality is noted.  IMPRESSION: No active cardiopulmonary disease.   Electronically Signed   By: [name]   On: 08/09/2013 09:02    ASSESSMENT AND PLAN:   The patient was seen today by Dr. Eden Emms, the patient evaluated and the data reviewed.  Active Problems:   DIABETES MELLITUS, TYPE II   TOBACCO ABUSE   Drug abuse and dependence   CHF (congestive heart failure)   Chest discomfort   Long term (current) use of anticoagulants   Impression: Chest Pain:  Atypical R/O ECG with early repol and LVH  No indication for further inpatient w/u.  Myovue likely to be abnormal due to collateralized LAD.  Resume beta blocker ASA and statin  F/U Dr Ella Bodo DM:  Major issue that will require longer hospitalization.  Previously on 4units regular with meals and 30 units lantus.  Nees close f/u with this and ? Financial assistance.   Hyponatremia:  Likely related to hyperglycemia follow CHF:  STable and euvolemic Probably with some spontaneous diuresis from hyperglycemia.  Previously on aldactone can restart pending lytes AICD:  No d/c pocket ok  St Jude to interogate    Can d/c when other medical issues stable with good f/u F/U cardiology with [name] in [facility]

## 2013-08-09 NOTE — Progress Notes (Signed)
Utilization review completed.  

## 2013-08-09 NOTE — ED Provider Notes (Signed)
CSN: [id number]     Arrival date & time 08/09/13  4098 History   First MD Initiated Contact with Patient 08/09/13 0818     Chief Complaint  Patient presents with  . Chest Pain   (Consider location/radiation/quality/duration/timing/severity/associated sxs/prior Treatment) HPI Comments: The patient is a 52 year old male with a past medical history of CHF, DM, and tobacco abuse presents to the Emergency Department with Left sided chest discomfort since last night.  He reports the 7/10 discomfort as "pulling" around his defibrillator site awoke him while sleeping around midnight. He denies radiation to the Jaw or upper extremity.  He reports associated dyspnea without cough.  Reports weakness since yesterday.  Denies Lower extremity swelling,abdominal pain, nausea, or vomiting.   Reports his cardiologist is Dr. [name], at [facility].   The history is provided by the patient.    Past Medical History  Diagnosis Date  . Diabetes mellitus   . CHF (congestive heart failure)   . Drug abuse and dependence    Past Surgical History  Procedure Laterality Date  . Difibula    . Cholecystectomy  1994  . Orif arm    . Cardiac defibrillator placement     Family History  Problem Relation Age of Onset  . Diabetes Mother   . Heart disease Mother   . Diabetes Father   . Prostate cancer Father   . Heart disease Father   . Diabetes Brother   . Diabetes Brother    History  Substance Use Topics  . Smoking status: Current Every Day Smoker -- 0.50 packs/day    Types: Cigarettes  . Smokeless tobacco: Never Used  . Alcohol Use: No    Review of Systems  All other systems reviewed and are negative.    Allergies  Review of patient's allergies indicates no known allergies.  Home Medications   Current Outpatient Rx  Name  Route  Sig  Dispense  Refill  . aspirin 325 MG tablet   Oral   Take 325 mg by mouth daily.         . bisacodyl (DULCOLAX) 10 MG suppository   Rectal   Place 10 mg  rectally as needed. For laxative         . bisacodyl (DULCOLAX) 5 MG EC tablet   Oral   Take 5 mg by mouth daily as needed. For stool softer         . CARAFATE 1 GM/10ML suspension               . carvedilol (COREG) 3.125 MG tablet   Oral   Take 3.125 mg by mouth 2 (two) times daily with a meal.         . docusate sodium (COLACE) 100 MG capsule   Oral   Take 100 mg by mouth 2 (two) times daily.         Marland Kitchen esomeprazole (NEXIUM) 40 MG capsule   Oral   Take 1 capsule (40 mg total) by mouth 2 (two) times daily.   10 capsule   0     Samples given to patient    Lot#    [id number]        ...   . insulin aspart (NOVOLOG) 100 UNIT/ML injection   Subcutaneous   Inject 3 Units into the skin 3 (three) times daily before meals.         . insulin glargine (LANTUS) 100 UNIT/ML injection   Subcutaneous   Inject 32 Units into  the skin at bedtime.         Marland Kitchen lisinopril (PRINIVIL,ZESTRIL) 2.5 MG tablet   Oral   Take 2.5 mg by mouth daily.         Marland Kitchen omeprazole (PRILOSEC) 40 MG capsule               . polyethylene glycol powder (GLYCOLAX/MIRALAX) powder               . QC PEN NEEDLES 31G X 6 MM MISC               . simvastatin (ZOCOR) 80 MG tablet   Oral   Take 80 mg by mouth at bedtime.         Marland Kitchen spironolactone (ALDACTONE) 25 MG tablet   Oral   Take 12.5 mg by mouth daily.         Marland Kitchen warfarin (COUMADIN) 2 MG tablet   Oral   Take 2 mg by mouth daily. 2 mg in addition to 5 mg         . warfarin (COUMADIN) 5 MG tablet   Oral   Take 5 mg by mouth daily. 5 mg in addition to 2 mg          BP 108/76  Pulse 88  Temp(Src) 97.6 F (36.4 C) (Oral)  Resp 20  Ht 6' (1.829 m)  Wt 155 lb (70.308 kg)  BMI 21.02 kg/m2  SpO2 100% Physical Exam  Nursing note and vitals reviewed. Constitutional: He appears well-developed and well-nourished. No distress.  HENT:  Head: Normocephalic and atraumatic.  Eyes: EOM are normal.  Neck: Neck supple. No JVD  present.  Cardiovascular: Normal rate and normal heart sounds.   Pain is not reproducible with palpation of the Left chest wall at the or at defibrillator site  Pulmonary/Chest: Effort normal and breath sounds normal. He has no wheezes. He has no rales. He exhibits no tenderness.  Abdominal: Soft.  Musculoskeletal: Normal range of motion. He exhibits no edema.  Neurological: He is alert.  Skin: Skin is warm and dry. No rash noted. He is not diaphoretic.    ED Course  Procedures (including critical care time) Labs Review Labs Reviewed - No data to display Imaging Review No results found.  EKG Interpretation     Ventricular Rate:  84 PR Interval:  192 QRS Duration: 92 QT Interval:  378 QTC Calculation: 446 R Axis:   58 Text Interpretation:  Normal sinus rhythm Possible Left atrial enlargement Anterior infarct , age undetermined Abnormal ECG since last tracing, ST abnormality now present in V5,V6, unchanged appearance V3,V4            MDM  No diagnosis found.  Patient with a PMH of CHF presents with Left sided chest discomfort and dyspnea.  Will order CBC, CMP,CBG, Chest XR, and troponin to evaluate chest discomfort.   CBG >600 Upon further questing the patient denies taking his night Lantus. Or morning medications.   Dr. [name] discussed the patient with Dr. [name] and he reports the last cath was 2010 which showed 100% occulusion of the LAD with collateral revascularization seen.  Dilated Cardiomyopathy EF <30%.  Anion Gap 11. Glucose 681  The patient is sleeping in room and reports pain has subsided.  States 1 episode of Pain since arrival.  Reports weakness.  Discussed abnormal lab work with the patient.   Consulted Dr. Molli Knock, Internal Medicine and he will evaluate the patient in the Emergency Department.  Meds given  in ED:  Medications  aspirin tablet 325 mg (not administered)  carvedilol (COREG) tablet 3.125 mg (not administered)  insulin glargine (LANTUS)  injection 32 Units (not administered)  lisinopril (PRINIVIL,ZESTRIL) tablet 2.5 mg (not administered)  atorvastatin (LIPITOR) tablet 40 mg (not administered)  spironolactone (ALDACTONE) tablet 12.5 mg (not administered)  insulin aspart (novoLOG) injection 0-15 Units (not administered)  nitroGLYCERIN (NITROSTAT) SL tablet 0.4 mg (not administered)  heparin injection 5,000 Units (not administered)  sodium chloride 0.9 % injection 3 mL (not administered)  sodium chloride 0.9 % injection 3 mL (not administered)  sodium chloride 0.9 % injection 3 mL (not administered)  0.9 %  sodium chloride infusion (not administered)  acetaminophen (TYLENOL) tablet 650 mg (not administered)    Or  acetaminophen (TYLENOL) suppository 650 mg (not administered)  morphine 2 MG/ML injection 2 mg (not administered)  warfarin (COUMADIN) tablet 7 mg (not administered)  Warfarin - Pharmacist Dosing Inpatient (not administered)  insulin aspart (novoLOG) injection 10 Units (10 Units Intravenous Given 08/09/13 1118)  aspirin chewable tablet 324 mg (324 mg Oral Given 08/09/13 1302)    Current Discharge Medication List        Clabe Seal, PA-C 08/09/13 1539

## 2013-08-09 NOTE — ED Notes (Signed)
Interrogated ICD/PM on patient and transmitted per order.

## 2013-08-09 NOTE — ED Provider Notes (Signed)
52 year old male with history of cardiomyopathy and congestive heart failure of unknown etiology. The patient gets his primary care in another city but according to a prior admission and discharge summary an echocardiogram that showed 30% ejection fraction from approximately one year ago. The patient denies having heart catheterization, has had defibrillator placed and is on anticoagulation likely related to his cardiomyopathy.  The patient states that at approximately midnight last night he started to have pulling sensation or a heaviness around his defibrillator site, this has been fluctuating in intensity, he was able to go back to sleep so he is unsure if it has been a constant sensation of pain. He does have associated shortness of breath with it. He has no swelling of his legs, no weight gain, no edema, no cough, no fevers.  On exam the patient has clear lungs, clear heart sounds, no peripheral edema or JVD, there is no tenderness surrounding the defibrillator site. His EKG is abnormal in that there is Q waves anteriorly, left ventricular hypertrophy and slight ST elevations in the lateral precordial leads just slightly different from prior EKGs. In fact review of EKGs over the last several years show abnormal EKGs consistently whether it is T wave inversions or ST abnormalities. The patient denies ever having heart attack but states that he has a severely depressed ejection fraction.  We'll need to discuss with cardiology, laboratory workup progressing, the patient is hyperglycemic, he has eaten this morning.  D/w the pt's primary cardiologist Dr. [name], - has been seen for many years in the CHF clinic - last angiogram from 2010 - L main normal, collaterals to distal LAD with prox LAD occlusion - no other sig occlusion.  EF < 30 for long time.  He was considered for transplant at one time but not currently.  Dilated ischemic cardiomyopathy related to the LAD according to cardiologist.  Medical  screening examination/treatment/procedure(s) were conducted as a shared visit with non-physician practitioner(s) and myself.  I personally evaluated the patient during the encounter.  Clinical Impression: Chest pain.      Vida Roller, MD 08/09/13 [phone]

## 2013-08-09 NOTE — ED Provider Notes (Signed)
Medical screening examination/treatment/procedure(s) were conducted as a shared visit with non-physician practitioner(s) and myself.  I personally evaluated the patient during the encounter  Please see my separate respective documentation pertaining to this patient encounter   Vida Roller, MD 08/09/13 [phone]

## 2013-08-09 NOTE — ED Notes (Signed)
Pt states he ate breakfast but DID NOT take insulin this morning, also that his sugars have been running "not so good".

## 2013-08-09 NOTE — H&P (Addendum)
  Date: 08/09/2013  Patient name: Dennis [name]  Medical record number: [id number]  Date of birth: [birth year]   I have seen and evaluated Olegario Shearer and discussed their care with the Residency Team.   Assessment and Plan: I have seen and evaluated the patient as outlined above. I agree with the formulated Assessment and Plan as detailed in the residents' admission note, with the following changes:   52 yo M with hx of ischemic CM (followed at [facility] by Dr [name]), DM2 (since 2003), ICD placed June 2012. Comes to ED with 24h of L anterior CP. He describes the pain as pulling on the lateral side of his ICD. He has had SOB as well, unchanged from his baseline however.  He has had a dry cough for 2 weeks, no fever, + chills.  He has been off his Coumadin and his insulin for the last month as he was not able to afford them.  FHx: Father CABG, Mother MI SOCHx: 1/4 ppd since teenager, hx of etoh use.  ROS see HPI: blurry vision, no ophtho this year, + constipation, no urinary difficulties, + paresthesias in feet.  Filed Vitals:   08/09/13 1423  BP: 110/74  Pulse: 81  Temp: 98.4 F (36.9 C)  Resp: 18  Eyes: EOMI, PERRL Mouth: without lesion Neck: NT, no lan Chest: CTA CV: RRR Chest wall: his ICD is non-tender, no fluctuance, no increase in heat. He does have pain inferior to this with deep breathing.  Extr: no diabetic foot lesions.  Pulses: 3+ all extrem Neuro: light touch grossly nl.   Labs of note: Troponin (-) x 2  BNP 174.5 UDS: cocaine POC Glc > 600 Na 129 Cr 1.08 WBC 8.3 CXR NAD  A/P Chest Pain DM2 with complications, out of control Cocaine Use Medication non-compliance Tobacco Use  Will restart his home rx's ASA, NTG,, beta blocker Watch his FSG, hydrate if needed to replete his Na and reduce his Glc. However hold for now given his low EF.  Check HIV test Check BCx given his hx of chills.  CV to eval as well.  Strongly encourage him to stop smoking   [name], MD 10/28/20143:40 PM

## 2013-08-09 NOTE — ED Notes (Signed)
[employer] rep called back-- interpreter-- states no defib, no tachycardia noted

## 2013-08-09 NOTE — H&P (Signed)
Hospital Admission Note Date: 08/09/2013  Patient name: Dennis [name] Medical record number: [id number] Date of birth: [birth year] Age: 52 y.o. Gender: male PCP: [name], MD  Medical Service: IMTS  Attending physician: Dr. Ninetta Lights  Internal Medicine Teaching Service Contact Information  Weekday Hours (7AM-5PM):  1st Contact: Dr. Boykin Peek  Pager: [phone] 2nd Contact: Dr. Bosie Clos        Pager:[phone]  ** If no return call within 15 minutes (after trying both pagers listed above), please call after hours pagers.  After 5 pm or weekends: 1st Contact: Pager: [phone] 2nd Contact: Pager: [phone]  Chief Complaint: chest pain  History of Present Illness:  The patient is a 52 year old male with past medical history of CHF, DM-II, and tobacco abuse, hx of drug abuse, defibrillator placement, who presents to the Emergency Department with Left sided chest discomfort since last night.   Patient reports that he was woken up by a pulling like discomfort over the left chest last night, at at about 12:00. He also had some chest tightness. It is located to the lateral of his defibrillator. The discomfort is intermittent. It happens approximately every 2 hours. Each time it lasts for about 3-4 minutes, then resolved spontaneously. It is not radiating. It is not aggravated or alleviated by any known factors. He has baseline shortness of breath secondary to congestive heart failure, which was slightly worse when he had the chest discomfort. Patient has been having mild dry cough in the past 2 weeks, which remains unchanged. He does not have tenderness over his defibrillator. He has chills, but no fever. He does not have recent cold-like symptoms, such as runny nose or sore throat. He did not have hx of recent long distance traveling. No pain in the calf areas. He had another episode of chest discomfort after arrival to ED, which spontaneously resolved after a few minutes.   Patient has been  followed up by cardiologist Dr. [name] at the [facility]. He was last seen by dr. [name] one week ago. He was told that his congestive heart failure was stable. His last interrogation of his defibrillator was proximately half year ago. Ed attending physician, Dr. [name] discussed with Dr. [name] on the phone. Per Dr. [name], the patient had angiogram 2010, which showed normal L main artery and collaterals to distal LAD with prox LAD occlusion. No stent was placed. His EF < 30 for long time, for which patient was supposed to take coumadin, but pateint missed several doses of coumadin recently. His INR is 1.01 today. He was considered for transplant at one time but not currently.  He has defibrillator placed on June 2012. He has dilated ischemic cardiomyopathy per Dr. [name].   ROS:  Denies fever, fatigue, headaches, abdominal pain, diarrhea, constipation, dysuria, urgency, frequency, hematuria, joint pain or leg swelling. He lost some weight recently per patient.   Meds: Current Outpatient Rx  Name  Route  Sig  Dispense  Refill  . aspirin 325 MG tablet   Oral   Take 325 mg by mouth daily.         . carvedilol (COREG) 3.125 MG tablet   Oral   Take 3.125 mg by mouth 2 (two) times daily with a meal.         . docusate sodium (COLACE) 100 MG capsule   Oral   Take 100 mg by mouth 2 (two) times daily.         Marland Kitchen esomeprazole (NEXIUM) 40 MG capsule  Oral   Take 1 capsule (40 mg total) by mouth 2 (two) times daily.   10 capsule   0     Samples given to patient    Lot#    [id number]        ...   . insulin aspart (NOVOLOG) 100 UNIT/ML injection   Subcutaneous   Inject 3 Units into the skin 3 (three) times daily before meals.         . insulin glargine (LANTUS) 100 UNIT/ML injection   Subcutaneous   Inject 32 Units into the skin at bedtime.         Marland Kitchen lisinopril (PRINIVIL,ZESTRIL) 2.5 MG tablet   Oral   Take 2.5 mg by mouth daily.         . simvastatin (ZOCOR) 80 MG tablet    Oral   Take 80 mg by mouth at bedtime.         Marland Kitchen spironolactone (ALDACTONE) 25 MG tablet   Oral   Take 12.5 mg by mouth daily.         Marland Kitchen warfarin (COUMADIN) 2 MG tablet   Oral   Take 2 mg by mouth daily. 2 mg in addition to 5 mg         . warfarin (COUMADIN) 5 MG tablet   Oral   Take 5 mg by mouth daily. 5 mg in addition to 2 mg         . QC PEN NEEDLES 31G X 6 MM MISC                 Allergies: Allergies as of 08/09/2013  . (No Known Allergies)   Past Medical History  Diagnosis Date  . Diabetes mellitus   . CHF (congestive heart failure)   . Drug abuse and dependence    Past Surgical History  Procedure Laterality Date  . Difibula    . Cholecystectomy  1994  . Orif arm    . Cardiac defibrillator placement     Family History  Problem Relation Age of Onset  . Diabetes Mother   . Heart disease Mother   . Diabetes Father   . Prostate cancer Father   . Heart disease Father   . Diabetes Brother   . Diabetes Brother    History   Social History  . Marital Status: Single    Spouse Name: N/A    Number of Children: 1  . Years of Education: N/A   Occupational History  . Disabled    Social History Main Topics  . Smoking status: Current Every Day Smoker -- 0.50 packs/day    Types: Cigarettes  . Smokeless tobacco: Never Used  . Alcohol Use: No  . Drug Use: No  . Sexual Activity: Not on file   Other Topics Concern  . Not on file   Social History Narrative  . No narrative on file    Review of Systems: Full 14-point review of systems otherwise negative except as noted above in HPI.  Physical Exam:   Filed Vitals:   08/09/13 0816 08/09/13 0918 08/09/13 1127 08/09/13 1300  BP: 108/76  110/72 111/78  Pulse: 88 101 82 88  Temp: 97.6 F (36.4 C)     TempSrc: Oral     Resp: 20 28  18   Height: 6' (1.829 m)     Weight: 155 lb (70.308 kg)     SpO2: 100% 97% 100% 99%    General: Not in acute distress HEENT: PERRL, EOMI, no  scleral icterus, No  JVD or bruit Cardiac: S1/S2, RRR, No murmurs, gallops or rubs. Chest wall: There is no tenderness over chest wall. There is no signs of infection in the skin over defibrillator, such as redness, warmth or tenderness Pulm: Good air movement bilaterally. Clear to auscultation bilaterally. No rales, wheezing, rhonchi or rubs. Abd: Soft, nondistended, nontender, no rebound pain, no organomegaly, BS present Ext: No edema. 2+DP/PT pulse bilaterally Musculoskeletal: No joint deformities, erythema, or stiffness, ROM full Skin: No rashes.  Neuro: Alert and oriented X3, cranial nerves II-XII grossly intact, muscle strength 5/5 in all extremeties, sensation to light touch intact.  Psych: Patient is not psychotic, no suicidal or hemocidal ideation.  Lab results: Basic Metabolic Panel:  Recent Labs  91/47/82 0840  NA 129*  K 4.2  CL 92*  CO2 26  GLUCOSE 681*  BUN 16  CREATININE 1.08  CALCIUM 9.0   Liver Function Tests:  Recent Labs  08/09/13 0840  AST 13  ALT 11  ALKPHOS 113  BILITOT 0.3  PROT 6.7  ALBUMIN 3.6   No results found for this basename: LIPASE, AMYLASE,  in the last 72 hours No results found for this basename: AMMONIA,  in the last 72 hours CBC:  Recent Labs  08/09/13 0840  WBC 8.3  HGB 13.8  HCT 41.7  MCV 95.0  PLT 207   Cardiac Enzymes:  Recent Labs  08/09/13 1215  TROPONINI <0.30   BNP: No results found for this basename: PROBNP,  in the last 72 hours D-Dimer: No results found for this basename: DDIMER,  in the last 72 hours CBG:  Recent Labs  08/09/13 0841  GLUCAP >600*   Hemoglobin A1C: No results found for this basename: HGBA1C,  in the last 72 hours Fasting Lipid Panel: No results found for this basename: CHOL, HDL, LDLCALC, TRIG, CHOLHDL, LDLDIRECT,  in the last 72 hours Thyroid Function Tests: No results found for this basename: TSH, T4TOTAL, FREET4, T3FREE, THYROIDAB,  in the last 72 hours Anemia Panel: No results found for this  basename: VITAMINB12, FOLATE, FERRITIN, TIBC, IRON, RETICCTPCT,  in the last 72 hours Coagulation:  Recent Labs  08/09/13 0840  LABPROT 13.1  INR 1.01   Urine Drug Screen: Drugs of Abuse  No results found for this basename: labopia,  cocainscrnur,  labbenz,  amphetmu,  thcu,  labbarb    Alcohol Level: No results found for this basename: ETH,  in the last 72 hours Urinalysis: No results found for this basename: COLORURINE, APPERANCEUR, LABSPEC, PHURINE, GLUCOSEU, HGBUR, BILIRUBINUR, KETONESUR, PROTEINUR, UROBILINOGEN, NITRITE, LEUKOCYTESUR,  in the last 72 hours Misc. Labs:  Imaging results:  Dg Chest 2 View  08/09/2013   CLINICAL DATA:  Pain and defibrillator site  EXAM: CHEST  2 VIEW  COMPARISON:  09/12/2012  FINDINGS: A defibrillator is again seen. The cardiac shadow is stable. The lungs are clear bilaterally. No acute infiltrate is seen. No bony abnormality is noted.  IMPRESSION: No active cardiopulmonary disease.   Electronically Signed   By: [name]   On: 08/09/2013 09:02    Other results:  EKG: Sinus rhythm, regular, normal axis, normal R wave progression, has T wave flattening in lead I and T wave inversion in aVL which are not changed compared with previous EKG on 09/04/12. Patient has new slight ST elevation in V3 to V5 today.   schemic change in T waves or ST segments.  Assessment & Plan by Problem: Active Problems:   DIABETES MELLITUS,  TYPE II   TOBACCO ABUSE   Drug abuse and dependence   CHF (congestive heart failure)   Chest discomfort   Long term (current) use of anticoagulants    Patient is a 52 year old M. with a past medical history of CHF, DM-II, and tobacco abuse, hx of drug abuse, defibrillator placement, who presents to the Emergency Department with Left sided chest discomfort since last night. Currently patient does not have chest discomfort. He is hemodynamically stable. Initial troponin is negative. INR 1.01. No leukocytosis. Chest x-ray is  negative. CBG 681 with AG of 11.   #: Chest discomfort:  Etiology is not clear currently. Patient has slight ST elevation in precordial leads which is new today. Given his significant risk factors and complicated cardiac history, it is important to rule out ischemia. Defibrillator infection is unlikely given no any signs of skin abnormalities around defibrillator, no fever or chills. Other DD include, but less likely, aortic dissection (no typical tearing chest pain, chest x-ray has no mediastinal widening), PE (chest is not pleuritic, patient does not have tachycardia, oxygen saturation is 100% at room air, Well's score is low probability), pneumothorax (negative chest x-ray), pneumonia (no leukocytosis, fever, and a negative chest x-ray), GERD (on Nexium at home, EGD on 09/15/12 was Normol) and anxiety. Currently patient does not have chest discomfort. He is hemodynamically stable. Initial troponin is negative.   - Will admit to TELE for observation - will consult to cardiology and follow up the recommendations - will get defibrillator interrogation ([employer]) - Cycle Trop x3  - EKG now in the morning  - Heart healthy diet  - ASA, Statin, NG, and home BB (coreg),  morphine prn - check TSH, A1C and lipid profile - restart coumadin per pharmacy  #: DM-II: Patient is on Lantus 32 units daily and NovoLog with meals at home. His blood sugars is 681 on admission. Anion gap 11. Patient received 10 units NovoLog in ED - Continue Lantus 32 units daily - Sliding scale insulin-moderate - check A1c  #:  CHF:  EF <30 per primary cardiologist, Dr. [name]. Patient does not have signs of acute exacerbation. He does not have JVD or any leg edema. His chest x-ray has no pulmonary congestion. He is euvolemic today. - will continue home meds: Aspirin, lisinopril 2.5 mg daily, spironolactone 12.5 mg daily   #: Tobacco abuse: Patient has smoked one pack a day for 25 years and cut down to 5 cigarettes per  day in the last 2 years. He is still smoking actively now.  -will consult to SW  #: HLD: Patient has been on simvastatin 80 mg daily. Liver function is normal. -switch to Lipitor 40 mg daily in hospital.   #  F/E/N  -SL  -f/u BMP - Diet: Heart health diet  # DVT px: Heparin sq   Dispo: Disposition is deferred at this time, awaiting improvement of current medical problems. Anticipated discharge in approximately 1 to 2 day(s).   The patient does have a current PCP [name], MD), therefore will be requiring OPC follow-up after discharge.   The patient does not have transportation limitations that hinder transportation to clinic appointments.  Signed:  Lorretta Harp, MD PGY3, Internal Medicine Teaching Service Pager: [phone]  08/09/2013, 1:29 PM

## 2013-08-09 NOTE — Consult Note (Signed)
ANTICOAGULATION CONSULT NOTE - Initial Consult  Pharmacy Consult for Coumadin Indication: VTE prophylaxis  No Known Allergies  Patient Measurements: Height: 6' (182.9 cm) Weight: 155 lb (70.308 kg) IBW/kg (Calculated) : 77.6  Vital Signs: Temp: 97.6 F (36.4 C) (10/28 0816) Temp src: Oral (10/28 0816) BP: 111/78 mmHg (10/28 1300) Pulse Rate: 88 (10/28 1300)  Labs:  Recent Labs  08/09/13 0840 08/09/13 1215  HGB 13.8  --   HCT 41.7  --   PLT 207  --   LABPROT 13.1  --   INR 1.01  --   CREATININE 1.08  --   TROPONINI  --  <0.30    Estimated Creatinine Clearance: 80.5 ml/min (by C-G formula based on Cr of 1.08).   Medical History: Past Medical History  Diagnosis Date  . Diabetes mellitus   . CHF (congestive heart failure)   . Drug abuse and dependence    Assessment: 52yom on coumadin pta for VTE prophylaxis given low EF. INR on admission is normal at 1.01. Home dose is 7mg  daily but admits to missing several doses recently. He is being admitted for chest pain and coumadin to continue.  Goal of Therapy:  INR 2-3 Monitor platelets by anticoagulation protocol: Yes   Plan:  1) Coumadin 7mg  x 1 2) Daily INR  Fredrik Rigger 08/09/2013,2:04 PM

## 2013-08-10 LAB — BASIC METABOLIC PANEL
BUN: 17 mg/dL (ref 6–23)
CO2: 19 mEq/L (ref 19–32)
Calcium: 8.9 mg/dL (ref 8.4–10.5)
Chloride: 101 mEq/L (ref 96–112)
Creatinine, Ser: 0.94 mg/dL (ref 0.50–1.35)
GFR calc Af Amer: >90 mL/min (ref >90)
GFR calc non Af Amer: >90 mL/min (ref >90)
Glucose, Bld: 246 mg/dL — ABNORMAL HIGH (ref 70–99)
Potassium: 3.9 mEq/L (ref 3.5–5.1)
Sodium: 132 mEq/L — ABNORMAL LOW (ref 135–145)

## 2013-08-10 LAB — CBC
HCT: 38.3 % — ABNORMAL LOW (ref 39.0–52.0)
Hemoglobin: 12.8 g/dL — ABNORMAL LOW (ref 13.0–17.0)
MCH: 31.3 pg (ref 26.0–34.0)
MCHC: 33.4 g/dL (ref 30.0–36.0)
MCV: 93.6 fL (ref 78.0–100.0)
Platelets: 192 10*3/uL (ref 150–400)
RBC: 4.09 MIL/uL — ABNORMAL LOW (ref 4.22–5.81)
RDW: 12.4 % (ref 11.5–15.5)
WBC: 9.3 10*3/uL (ref 4.0–10.5)

## 2013-08-10 LAB — GLUCOSE, CAPILLARY
Glucose-Capillary: 105 mg/dL — ABNORMAL HIGH (ref 70–99)
Glucose-Capillary: 106 mg/dL — ABNORMAL HIGH (ref 70–99)
Glucose-Capillary: 128 mg/dL — ABNORMAL HIGH (ref 70–99)
Glucose-Capillary: 133 mg/dL — ABNORMAL HIGH (ref 70–99)
Glucose-Capillary: 299 mg/dL — ABNORMAL HIGH (ref 70–99)

## 2013-08-10 LAB — TROPONIN I: Troponin I: <0.3 ng/mL (ref <0.30)

## 2013-08-10 LAB — LIPID PANEL
Cholesterol: 156 mg/dL (ref 0–200)
HDL: 73 mg/dL (ref >39)
LDL Cholesterol: 72 mg/dL (ref 0–99)
Total CHOL/HDL Ratio: 2.1 RATIO
Triglycerides: 57 mg/dL (ref <150)
VLDL: 11 mg/dL (ref 0–40)

## 2013-08-10 LAB — PROTIME-INR
INR: 1.04 (ref 0.00–1.49)
Prothrombin Time: 13.4 seconds (ref 11.6–15.2)

## 2013-08-10 MED ORDER — WARFARIN SODIUM 10 MG PO TABS
10.0000 mg | ORAL_TABLET | Freq: Once | ORAL | Status: DC
  Filled 2013-08-10: qty 1

## 2013-08-10 NOTE — Progress Notes (Signed)
INITIAL NUTRITION ASSESSMENT  DOCUMENTATION CODES Per approved criteria  -Severe malnutrition in the context of chronic illness   INTERVENTION:  Provide CHO-modified diet to meet estimated nutrition needs to support gradual weight gain back to usual weight.  NUTRITION DIAGNOSIS: Malnutrition related to inadequate oral intake and uncontrolled blood glucose as evidenced by severe depletion of subcutaneous fat and muscle mass and intake </= 50% of estimated energy requirement for >/= 1 month.   Goals: Intake to meet >90% of estimated nutrition needs. Optimal glucose control.  Monitor:  PO intake, weight trend, labs.  Reason for Assessment: MST=4  52 y.o. male  Admitting Dx: Chest Pain  ASSESSMENT: Patient was admitted with chest pain on 10/28. History of CHF, DM, tobacco abuse, drug abuse, and defibrillator placement.  Patient reports that he has lost 30 lb over the past few months due to poor appetite and poor intake. He will go for 2 days in a row without eating due to a poor appetite; this occurs at least 3-4 days out of the week.  Patient reports that his appetite is great now and he has been consuming 100% of all meals. Has also received snacks from the unit when he asked for them. Current intake is adequate to meet estimated nutrition needs; no supplementation needed at this time.  Nutrition Focused Physical Exam:  Subcutaneous Fat:  Orbital Region: mild-moderate depletion Upper Arm Region: severe depletion Thoracic and Lumbar Region: severe depletion  Muscle:  Temple Region: severe depletion Clavicle Bone Region: severe depletion Clavicle and Acromion Bone Region: severe depletion Scapular Bone Region: severe depletion Dorsal Hand: severe depletion Patellar Region: severe depletion Anterior Thigh Region: mild-moderate depletion Posterior Calf Region: severe depletion  Edema: none  Pt meets criteria for severe MALNUTRITION in the context of chronic illness as  evidenced by severe depletion of subcutaneous fat and muscle mass and intake </= 50% of estimated energy requirement for >/= 1 month.   Height: Ht Readings from Last 1 Encounters:  08/09/13 6' (1.829 m)    Weight: Wt Readings from Last 1 Encounters:  08/10/13 153 lb 3.2 oz (69.491 kg)    Ideal Body Weight: 80.9 kg  % Ideal Body Weight: 86%  Wt Readings from Last 10 Encounters:  08/10/13 153 lb 3.2 oz (69.491 kg)  09/15/12 169 lb (76.658 kg)  09/14/12 169 lb (76.658 kg)  09/12/12 175 lb (79.379 kg)  08/07/12 165 lb (74.844 kg)  10/12/07 164 lb 2.1 oz (74.449 kg)  09/13/07 164 lb 2.1 oz (74.449 kg)    Usual Body Weight: 180-190 lb per patient  % Usual Body Weight: 84%  BMI:  Body mass index is 20.77 kg/(m^2).  Estimated Nutritional Needs: Kcal: 2200-2400 Protein: 100-120 gm Fluid: 2.2-2.4 L  Skin: no wounds  Diet Order: Cardiac  EDUCATION NEEDS: -Education needs addressed-discussed with patient the importance of CHO-counting, taking medications as scheduled, and scheduled meals/snacks.   No intake or output data in the 24 hours ending 08/10/13 1304  Last BM: unknown   Labs:   Recent Labs Lab 08/09/13 0840 08/10/13 0522  NA 129* 132*  K 4.2 3.9  CL 92* 101  CO2 26 19  BUN 16 17  CREATININE 1.08 0.94  CALCIUM 9.0 8.9  GLUCOSE 681* 246*    CBG (last 3)   Recent Labs  08/10/13 0421 08/10/13 0818 08/10/13 1208  GLUCAP 105* 299* 106*    Scheduled Meds: . aspirin  325 mg Oral Daily  . atorvastatin  40 mg Oral q1800  .  carvedilol  3.125 mg Oral BID WC  . heparin  5,000 Units Subcutaneous Q8H  . insulin aspart  0-15 Units Subcutaneous Q4H  . insulin glargine  32 Units Subcutaneous QHS  . lisinopril  2.5 mg Oral Daily  . sodium chloride  3 mL Intravenous Q12H  . sodium chloride  3 mL Intravenous Q12H  . spironolactone  12.5 mg Oral Daily  . warfarin  10 mg Oral ONCE-1800  . Warfarin - Pharmacist Dosing Inpatient   Does not apply q1800     Continuous Infusions:   Past Medical History  Diagnosis Date  . CHF (congestive heart failure)   . Drug abuse and dependence   . Automatic implantable cardioverter-defibrillator in situ   . Heart murmur   . Sleep apnea     "cleared after T&A" (08/09/2013)  . Exertional shortness of breath   . Type I diabetes mellitus     Past Surgical History  Procedure Laterality Date  . Forearm fracture surgery Left 1980  . Cardiac defibrillator placement  03/2011  . Cholecystectomy  1994  . Tonsillectomy and adenoidectomy  ~ 1998    Joaquin Courts, RD, LDN, CNSC Pager [phone] After Hours Pager [phone]

## 2013-08-10 NOTE — Care Management Note (Signed)
   CARE MANAGEMENT NOTE 08/10/2013  Patient:  [facility]   Account Number:  [id number]  Date Initiated:  08/10/2013  Documentation initiated by:  ROYAL,CHERYL  Subjective/Objective Assessment:   Consult for assistance with medications, however this pt has insurance. CM is unable to assist with meds for pt with insurance, with medication benefits.     Action/Plan:   Resident notified of this.   Anticipated DC Date:     Anticipated DC Plan:           Choice offered to / List presented to:             Status of service:  In process, will continue to follow Medicare Important Message given?   (If response is "NO", the following Medicare IM given date fields will be blank) Date Medicare IM given:   Date Additional Medicare IM given:    Discharge Disposition:  HOME/SELF CARE  Per UR Regulation:    If discussed at Long Length of Stay Meetings, dates discussed:    Comments:

## 2013-08-10 NOTE — Progress Notes (Signed)
Clinical Social Work Department BRIEF PSYCHOSOCIAL ASSESSMENT 08/10/2013  Patient:  [facility]     Account Number:  [id number]     Admit date:  08/09/2013  Clinical Social Worker:  Harless Nakayama  Date/Time:  08/10/2013 02:00 PM  Referred by:  Physician  Date Referred:  08/10/2013 Referred for  Substance Abuse   Other Referral:   Interview type:  Patient Other interview type:    PSYCHOSOCIAL DATA Living Status:   Admitted from facility:   Level of care:   Primary support name:  Puneet Masoner Sr [phone] Primary support relationship to patient:  PARENT Degree of support available:   CSW unable to assess    CURRENT CONCERNS Current Concerns  Substance Abuse   Other Concerns:    SOCIAL WORK ASSESSMENT / PLAN CSW saw pt briefly before pt discharged. CSW explained reason for CSW referral and spoke with pt about substance abuse and tobacco use. Pt informed CSW he does not need any assistance and that he has "gone cold [place] before and can do it again". CSW did verbally relay possible resources to pt incase he does need assitance.   Assessment/plan status:  No Further Intervention Required Other assessment/ plan:   Information/referral to community resources:   Pt denied resource lists    PATIENT'S/FAMILY'S RESPONSE TO PLAN OF CARE: Pt accepting of CSW presence but not wanting any resources.       Poonum Ambelal, LCSWA [phone]

## 2013-08-10 NOTE — Progress Notes (Signed)
Subjective:  Pt has no further episodes of CP.    Objective: Vital signs in last 24 hours: Filed Vitals:   08/09/13 1300 08/09/13 1423 08/10/13 0400 08/10/13 1006  BP: 111/78 110/74 104/84 114/79  Pulse: 88 81 92 94  Temp:  98.4 F (36.9 C) 97.8 F (36.6 C)   TempSrc:  Oral    Resp: 18 18 18    Height:      Weight:   153 lb 3.2 oz (69.491 kg)   SpO2: 99% 96% 100%    Weight change:  No intake or output data in the 24 hours ending 08/10/13 1829   Lab Results: Basic Metabolic Panel:  Recent Labs Lab 08/09/13 0840 08/10/13 0522  NA 129* 132*  K 4.2 3.9  CL 92* 101  CO2 26 19  GLUCOSE 681* 246*  BUN 16 17  CREATININE 1.08 0.94  CALCIUM 9.0 8.9   Liver Function Tests:  Recent Labs Lab 08/09/13 0840  AST 13  ALT 11  ALKPHOS 113  BILITOT 0.3  PROT 6.7  ALBUMIN 3.6   No results found for this basename: LIPASE, AMYLASE,  in the last 168 hours No results found for this basename: AMMONIA,  in the last 168 hours CBC:  Recent Labs Lab 08/09/13 0840 08/10/13 0522  WBC 8.3 9.3  HGB 13.8 12.8*  HCT 41.7 38.3*  MCV 95.0 93.6  PLT 207 192   Cardiac Enzymes:  Recent Labs Lab 08/09/13 1215 08/09/13 1805 08/09/13 2325  TROPONINI <0.30 <0.30 <0.30   BNP: No results found for this basename: PROBNP,  in the last 168 hours D-Dimer: No results found for this basename: DDIMER,  in the last 168 hours CBG:  Recent Labs Lab 08/09/13 1636 08/09/13 2054 08/10/13 0012 08/10/13 0421 08/10/13 0818 08/10/13 1208  GLUCAP 352* 128* 133* 105* 299* 106*   Hemoglobin A1C:  Recent Labs Lab 08/09/13 1500  HGBA1C 12.9*   Fasting Lipid Panel:  Recent Labs Lab 08/10/13 0522  CHOL 156  HDL 73  LDLCALC 72  TRIG 57  CHOLHDL 2.1   Thyroid Function Tests:  Recent Labs Lab 08/09/13 1642  TSH 0.581   Coagulation:  Recent Labs Lab 08/09/13 0840 08/10/13 0522  LABPROT 13.1 13.4  INR 1.01 1.04   Anemia Panel: No results found for this basename:  VITAMINB12, FOLATE, FERRITIN, TIBC, IRON, RETICCTPCT,  in the last 168 hours Urine Drug Screen: Drugs of Abuse     Component Value Date/Time   LABOPIA NONE DETECTED 08/09/2013 1357   COCAINSCRNUR POSITIVE* 08/09/2013 1357   LABBENZ NONE DETECTED 08/09/2013 1357   AMPHETMU NONE DETECTED 08/09/2013 1357   THCU NONE DETECTED 08/09/2013 1357   LABBARB NONE DETECTED 08/09/2013 1357    Alcohol Level: No results found for this basename: ETH,  in the last 168 hours Urinalysis: No results found for this basename: COLORURINE, APPERANCEUR, LABSPEC, PHURINE, GLUCOSEU, HGBUR, BILIRUBINUR, KETONESUR, PROTEINUR, UROBILINOGEN, NITRITE, LEUKOCYTESUR,  in the last 168 hours  Micro Results: Recent Results (from the past 240 hour(s))  CULTURE, BLOOD (ROUTINE X 2)     Status: None   Collection Time    08/09/13  4:42 PM      Result Value Range Status   Specimen Description BLOOD LEFT ARM   Final   Special Requests BOTTLES DRAWN AEROBIC ONLY 10CC   Final   Culture  Setup Time     Final   Value: 08/09/2013 21:38     Performed at [facility]  Final   Value:        BLOOD CULTURE RECEIVED NO GROWTH TO DATE CULTURE WILL BE HELD FOR 5 DAYS BEFORE ISSUING A FINAL NEGATIVE REPORT     Performed at [facility]   Report Status PENDING   Incomplete  CULTURE, BLOOD (ROUTINE X 2)     Status: None   Collection Time    08/09/13  4:46 PM      Result Value Range Status   Specimen Description BLOOD LEFT HAND   Final   Special Requests BOTTLES DRAWN AEROBIC AND ANAEROBIC 10CC   Final   Culture  Setup Time     Final   Value: 08/09/2013 21:38     Performed at [facility]   Culture     Final   Value:        BLOOD CULTURE RECEIVED NO GROWTH TO DATE CULTURE WILL BE HELD FOR 5 DAYS BEFORE ISSUING A FINAL NEGATIVE REPORT     Performed at [facility]   Report Status PENDING   Incomplete   Studies/Results: Dg Chest 2 View  08/09/2013   CLINICAL DATA:  Pain and  defibrillator site  EXAM: CHEST  2 VIEW  COMPARISON:  09/12/2012  FINDINGS: A defibrillator is again seen. The cardiac shadow is stable. The lungs are clear bilaterally. No acute infiltrate is seen. No bony abnormality is noted.  IMPRESSION: No active cardiopulmonary disease.   Electronically Signed   By: [name]   On: 08/09/2013 09:02   Medications: I have reviewed the patient's current medications. Scheduled Meds:  Continuous Infusions: PRN Meds:.   Assessment/Plan: Active Problems:   DIABETES MELLITUS, TYPE II   TOBACCO ABUSE   Drug abuse and dependence   CHF (congestive heart failure)   Chest discomfort   Long term (current) use of anticoagulants  #: Atypical Chest Pain: Resolved. Most likely musculoskeletal since pain appears more positional. Cardiology consulted and recommended to continue current treatment and close follow up with Dr. [name].  Cardiology states Myoview not likely helpful and will most likely be abnormal due to collateralized LAD.  His troponins were negative and there were no acute EKG changes. We continued his home meds and restarted coumadin.  #: Uncontrolled DM-II: Pt was admitted with serum glucose of 681. Patient is on Lantus 32 units daily and NovoLog with meals at home. His blood sugars is 681 on admission. Anion gap 11. Patient received 10 units NovoLog in ED. Continued Lantus 32 units daily and SSI-moderate. HA1C 12.9 today.   #: CHF: Stable. EF <30 per primary cardiologist, Dr. [name]. Patient does not have signs of acute exacerbation. He does not have JVD or any leg edema. His chest x-ray has no pulmonary congestion. He is euvolemic today. We continued home meds: Aspirin, lisinopril 2.5 mg daily, spironolactone 12.5 mg daily. Pt has a scheduled follow up appt with Dr. [name] in 2015.  He just had a follow up appointment with Dr. [name] on 08/03/13 and they would like for him to keep his current follow up appointment.  #: Tobacco abuse: Patient has  smoked one pack a day for 25 years and cut down to 5 cigarettes per day in the last 2 years. He is still smoking actively now.  Consulted CSW but they apparently do not offer smoking cessation counseling/tools.  #: HLD: Patient on simvastatin 80 mg daily. Normal liver function. We will give Lipitor 40 mg daily in hospital. Lipid panel today was normal.  #:  Chronic Mild Hyponatremia: Most likely due to hyperglycemia. Today sodium is 132 which is at his baseline.    Dispo: Patient will be discharged today.   The patient does have a current PCP Loletta Specter, MD) and does need an [facility] hospital follow-up appointment after discharge.   .Services Needed at time of discharge: Y = Yes, Blank = No PT:   OT:   RN:   Equipment:   Other:     LOS: 1 day   Boykin Peek, MD 08/10/2013, 6:29 PM

## 2013-08-10 NOTE — Progress Notes (Addendum)
Inpatient Diabetes Program Recommendations  AACE/ADA: New Consensus Statement on Inpatient Glycemic Control (2013)  Target Ranges:  Prepandial:   less than 140 mg/dL      Peak postprandial:   less than 180 mg/dL (1-2 hours)      Critically ill patients:  140 - 180 mg/dL     Results for SENAN, UREY (MRN [id number]) as of 08/10/2013 14:05  Ref. Range 08/09/2013 08:41 08/09/2013 13:44 08/09/2013 16:36 08/09/2013 20:54  Glucose-Capillary Latest Range: 70-99 mg/dL >409 (HH) 811 (H) 914 (H) 128 (H)    Results for ZANIEL, MARINEAU (MRN [id number]) as of 08/10/2013 14:05  Ref. Range 08/10/2013 00:12 08/10/2013 04:21 08/10/2013 08:18 08/10/2013 12:08  Glucose-Capillary Latest Range: 70-99 mg/dL 086 (H) 578 (H) 469 (H) 106 (H)    Results for KENDERICK, KOBLER (MRN [id number]) as of 08/10/2013 14:05  Ref. Range 08/09/2013 15:00  Hemoglobin A1C Latest Range: <5.7 % 12.9 (H)    **Spoke with patient at length about his DM care at home.  Patient told me he sees Dr. Nedra Hai at the [facility] in [place] for DM management.  Patient went on to tell me that he last saw Dr. Nedra Hai in June and Dr. Nedra Hai decreased his Novolog dose to 4 units tid with meals (from 8 units tid with meals) as patient was having hypoglycemia at home.  Still on the same amount of Lantus 32 units QHS.  **Patient admitted to me that he only checks his CBGs once every 2 weeks at home and has missed insulin before.  Spoke to patient about his current A1c of 12.9% (08/09/13).  Explained what an A1c is and what it measures.  Reminded patient that his goal A1c is 7% or less per ADA standards to prevent both acute and long-term complications.  Encouraged patient to check his CBGs at least tid at home and to record all CBGs in a logbook for his PCP to review.  Also reminded patient about all the detrimental effects that uncontrolled glucose levels can have on his body.  Encouraged patient to stop smoking and to seek follow-up care if he has  sustained hyperglycemia or hypoglycemia at home.  **Patient stated that he had an interest in moving his cardiology care and his primary care to [facility] (he currently sees cardiologist and PCP in [place]).  Not sure if patient would be able to get established in the [facility] clinic.  Currently patient can get his Novolog and Lantus insulin pens for an affordable price in [place] at the [facility].  Not sure what price he will have to pay for insulin pens out here in [place].  MD, please address this with this patient.  **MD- Please provide patient with Rx for new CBG meter at time of d/c.  Thanks!   Will follow. Ambrose Finland RN, MSN, CDE Diabetes Coordinator Inpatient Diabetes Program Team Pager: [phone] (8a-10p)

## 2013-08-10 NOTE — Consult Note (Signed)
ANTICOAGULATION CONSULT NOTE - Follow-up Consult  Pharmacy Consult for Coumadin Indication: VTE prophylaxis  No Known Allergies  Patient Measurements: Height: 6' (182.9 cm) Weight: 153 lb 3.2 oz (69.491 kg) IBW/kg (Calculated) : 77.6  Vital Signs: Temp: 97.8 F (36.6 C) (10/29 0400) BP: 104/84 mmHg (10/29 0400) Pulse Rate: 92 (10/29 0400)  Labs:  Recent Labs  08/09/13 0840 08/09/13 1215 08/09/13 1805 08/09/13 2325 08/10/13 0522  HGB 13.8  --   --   --  12.8*  HCT 41.7  --   --   --  38.3*  PLT 207  --   --   --  192  LABPROT 13.1  --   --   --  13.4  INR 1.01  --   --   --  1.04  CREATININE 1.08  --   --   --  0.94  TROPONINI  --  <0.30 <0.30 <0.30  --     Estimated Creatinine Clearance: 91.4 ml/min (by C-G formula based on Cr of 0.94).  Assessment: 52yom on coumadin pta for VTE prophylaxis given low EF. INR on admission was subtherapeutic at 1.01. Home dose is 7mg  daily but pt has been off for last month due to inability to afford his medications. INR with no movement past 7 mg dose given yesterday. No bleeding noted. Pt on SQ heparin while INR<2.  Goal of Therapy:  INR 2-3 Monitor platelets by anticoagulation protocol: Yes   Plan:  1) Coumadin 10mg  x 1 today 2) Daily INR  Christoper Fabian, PharmD, BCPS Clinical pharmacist, pager [phone] 08/10/2013,9:51 AM

## 2013-08-10 NOTE — Care Management Note (Signed)
Noted consult for CM,  STAT need unclear, please call CM for any "STAT" needs. This pt has insurance therefore does not qualify for assistance with medications. Please consider cost when prescribing d/c meds. Johny Shock RN MPH, case manager, [phone]

## 2013-08-10 NOTE — Discharge Summary (Signed)
Name: Dennis [name] MRN: [id number] DOB: 07/26/1961 52 y.o. PCP: [name], MD  Date of Admission: 08/09/2013  8:17 AM Date of Discharge: 08/10/2013 Attending Physician: [name], MD  Discharge Diagnosis: Active Problems:   DIABETES MELLITUS, TYPE II   TOBACCO ABUSE   Drug abuse and dependence   CHF (congestive heart failure)   Chest discomfort   Long term (current) use of anticoagulants  Discharge Medications:   Medication List    ASK your doctor about these medications       aspirin 325 MG tablet  Take 325 mg by mouth daily.     carvedilol 3.125 MG tablet  Commonly known as:  COREG  Take 3.125 mg by mouth 2 (two) times daily with a meal.     docusate sodium 100 MG capsule  Commonly known as:  COLACE  Take 100 mg by mouth 2 (two) times daily.     esomeprazole 40 MG capsule  Commonly known as:  NEXIUM  Take 1 capsule (40 mg total) by mouth 2 (two) times daily.     insulin aspart 100 UNIT/ML injection  Commonly known as:  novoLOG  Inject 3 Units into the skin 3 (three) times daily before meals.     insulin glargine 100 UNIT/ML injection  Commonly known as:  LANTUS  Inject 32 Units into the skin at bedtime.     lisinopril 2.5 MG tablet  Commonly known as:  PRINIVIL,ZESTRIL  Take 2.5 mg by mouth daily.     QC PEN NEEDLES 31G X 6 MM Misc  Generic drug:  Insulin Pen Needle     simvastatin 80 MG tablet  Commonly known as:  ZOCOR  Take 80 mg by mouth at bedtime.     spironolactone 25 MG tablet  Commonly known as:  ALDACTONE  Take 12.5 mg by mouth daily.     warfarin 5 MG tablet  Commonly known as:  COUMADIN  Take 5 mg by mouth daily. 5 mg in addition to 2 mg     warfarin 2 MG tablet  Commonly known as:  COUMADIN  Take 2 mg by mouth daily. 2 mg in addition to 5 mg        Disposition and follow-up:   Mr.Dennis [name] was discharged from [facility] in Good condition.  At the hospital follow up visit please address:  1.   Compliance with meds especially diabetes regimen (HA1C 12.9) and address continued use of cocaine and further chest pain  2.  Labs / imaging needed at time of follow-up: BMP  3.  Pending labs/ test needing follow-up: None  Follow-up Appointments:     Follow-up Information   Follow up with [name], MD.   Specialty:  Internal Medicine      Discharge Instructions:   Consultations: Treatment Team:  Rounding Lbcardiology, MD  Procedures Performed:  Dg Chest 2 View  08/09/2013   CLINICAL DATA:  Pain and defibrillator site  EXAM: CHEST  2 VIEW  COMPARISON:  09/12/2012  FINDINGS: A defibrillator is again seen. The cardiac shadow is stable. The lungs are clear bilaterally. No acute infiltrate is seen. No bony abnormality is noted.  IMPRESSION: No active cardiopulmonary disease.   Electronically Signed   By: [name]   On: 08/09/2013 09:02    2D Echo: None  Cardiac Cath: None  Admission HPI: The patient is a 52 year old male with past medical history of CHF, DM-II, and tobacco abuse, hx of drug  abuse, defibrillator placement, who presents to the Emergency Department with Left sided chest discomfort since last night.  Patient reports that he was woken up by a pulling like discomfort over the left chest last night, at at about 12:00. He also had some chest tightness. It is located to the lateral of his defibrillator. The discomfort is intermittent. It happens approximately every 2 hours. Each time it lasts for about 3-4 minutes, then resolved spontaneously. It is not radiating. It is not aggravated or alleviated by any known factors. He has baseline shortness of breath secondary to congestive heart failure, which was slightly worse when he had the chest discomfort. Patient has been having mild dry cough in the past 2 weeks, which remains unchanged. He does not have tenderness over his defibrillator. He has chills, but no fever. He does not have recent cold-like symptoms, such as  runny nose or sore throat. He did not have hx of recent long distance traveling. No pain in the calf areas. He had another episode of chest discomfort after arrival to ED, which spontaneously resolved after a few minutes.  Patient has been followed up by cardiologist Dr. [name] at the [facility]. He was last seen by dr. [name] one week ago. He was told that his congestive heart failure was stable. His last interrogation of his defibrillator was proximately half year ago. Ed attending physician, Dr. [name] discussed with Dr. [name] on the phone. Per Dr. [name], the patient had angiogram 2010, which showed normal L main artery and collaterals to distal LAD with prox LAD occlusion. No stent was placed. His EF < 30 for long time, for which patient was supposed to take coumadin, but pateint missed several doses of coumadin recently. His INR is 1.01 today. He was considered for transplant at one time but not currently. He has defibrillator placed on June 2012. He has dilated ischemic cardiomyopathy per Dr. [name].    Hospital Course by problem list: Active Problems:   DIABETES MELLITUS, TYPE II   TOBACCO ABUSE   Drug abuse and dependence   CHF (congestive heart failure)   Chest discomfort   Long term (current) use of anticoagulants   #: Atypical Chest Pain: Etiology was not clear but most likely due to cocaine use.   UDS positive for cocaine.  Patient had slight ST elevation in precordial leads which was new.  Given his significant risk factors and complicated cardiac history, it was important to rule out ischemia. Defibrillator infection is unlikely given no any signs of skin abnormalities around defibrillator, no fever or chills. Other DDx include, but less likely, aortic dissection (no typical tearing chest pain, chest x-ray has no mediastinal widening), PE (chest is not pleuritic, patient does not have tachycardia, oxygen saturation is 100% at room air, Well's score is low probability), pneumothorax  (negative chest x-ray), pneumonia (no leukocytosis, fever, and a negative chest x-ray), GERD (on Nexium at home, EGD on 09/15/12 was Normol) and anxiety.   He was hemodynamically stable.  He was admitted to telemetry for observation and consulted cardiology.  Troponins X 3 were negative.  We continued his home medications.  Lipid panel and TSH was normal.  He was hyponatremic probably due to a glucose of 681.  HA1C was 12.9.  Cardiology states Celine Ahr is likely to be abnormal due to collateralized LAD and recommended to resume beta blocker, ASA, statin, and to f/u with Dr. [name] at [facility].  I contacted Dr. Norva Pavlov office and they had just seen him in  the office several days prior to this hospitalization.  He has a previously scheduled 6 month follow up appointment with Dr. [name] next year and they recommended him keeping that appointment.    #: Uncontrolled DM-II: Patient on Lantus 32 units daily and NovoLog with meals at home. His blood sugar is 681 on admission. Anion gap 11. Patient received 10 units NovoLog in ED.  We continued Lantus 32 units daily and started a moderate SSI.  HA1C was 12.9.  He will need close f/u as an outpatient for further management of his diabetes.   #: CHF: Stable. LVEF <30 per primary cardiologist, Dr. [name]. Patient does not have signs of acute exacerbation. He does not have JVD or any leg edema. His chest x-ray has no pulmonary congestion. He is euvolemic.  We continued home meds: Aspirin, lisinopril 2.5 mg daily, spironolactone 12.5 mg daily.    #: Tobacco abuse: Patient has smoked one pack a day for 25 years and cut down to 5 cigarettes per day in the last 2 years. He is still smoking actively.  Consulted to SW for further cessation.    #: HLD: Stable. Patient has been on simvastatin 80 mg daily. Liver function is normal. Lipid panel normal.    Discharge Vitals:   BP 104/84  Pulse 92  Temp(Src) 97.8 F (36.6 C) (Oral)  Resp 18  Ht 6' (1.829 m)  Wt 153 lb 3.2 oz  (69.491 kg)  BMI 20.77 kg/m2  SpO2 100%  Discharge Labs:  Results for orders placed during the hospital encounter of 08/09/13 (from the past 24 hour(s))  POCT I-STAT TROPONIN I     Status: None   Collection Time    08/09/13  8:50 AM      Result Value Range   Troponin i, poc 0.01  0.00 - 0.08 ng/mL   Comment 3           TROPONIN I     Status: None   Collection Time    08/09/13 12:15 PM      Result Value Range   Troponin I <0.30  <0.30 ng/mL  GLUCOSE, CAPILLARY     Status: Abnormal   Collection Time    08/09/13  1:44 PM      Result Value Range   Glucose-Capillary 270 (*) 70 - 99 mg/dL   Comment 1 Documented in Chart     Comment 2 Notify RN    URINE RAPID DRUG SCREEN (HOSP PERFORMED)     Status: Abnormal   Collection Time    08/09/13  1:57 PM      Result Value Range   Opiates NONE DETECTED  NONE DETECTED   Cocaine POSITIVE (*) NONE DETECTED   Benzodiazepines NONE DETECTED  NONE DETECTED   Amphetamines NONE DETECTED  NONE DETECTED   Tetrahydrocannabinol NONE DETECTED  NONE DETECTED   Barbiturates NONE DETECTED  NONE DETECTED  HEMOGLOBIN A1C     Status: Abnormal   Collection Time    08/09/13  3:00 PM      Result Value Range   Hemoglobin A1C 12.9 (*) <5.7 %   Mean Plasma Glucose 324 (*) <117 mg/dL  GLUCOSE, CAPILLARY     Status: Abnormal   Collection Time    08/09/13  4:36 PM      Result Value Range   Glucose-Capillary 352 (*) 70 - 99 mg/dL  TSH     Status: None   Collection Time    08/09/13  4:42 PM  Result Value Range   TSH 0.581  0.350 - 4.500 uIU/mL  CULTURE, BLOOD (ROUTINE X 2)     Status: None   Collection Time    08/09/13  4:42 PM      Result Value Range   Specimen Description BLOOD LEFT ARM     Special Requests BOTTLES DRAWN AEROBIC ONLY 10CC     Culture  Setup Time       Value: 08/09/2013 21:38     Performed at [facility]   Culture       Value:        BLOOD CULTURE RECEIVED NO GROWTH TO DATE CULTURE WILL BE HELD FOR 5 DAYS BEFORE ISSUING A  FINAL NEGATIVE REPORT     Performed at [facility]   Report Status PENDING    CULTURE, BLOOD (ROUTINE X 2)     Status: None   Collection Time    08/09/13  4:46 PM      Result Value Range   Specimen Description BLOOD LEFT HAND     Special Requests BOTTLES DRAWN AEROBIC AND ANAEROBIC 10CC     Culture  Setup Time       Value: 08/09/2013 21:38     Performed at [facility]   Culture       Value:        BLOOD CULTURE RECEIVED NO GROWTH TO DATE CULTURE WILL BE HELD FOR 5 DAYS BEFORE ISSUING A FINAL NEGATIVE REPORT     Performed at [facility]   Report Status PENDING    TROPONIN I     Status: None   Collection Time    08/09/13  6:05 PM      Result Value Range   Troponin I <0.30  <0.30 ng/mL  GLUCOSE, CAPILLARY     Status: Abnormal   Collection Time    08/09/13  8:54 PM      Result Value Range   Glucose-Capillary 128 (*) 70 - 99 mg/dL  TROPONIN I     Status: None   Collection Time    08/09/13 11:25 PM      Result Value Range   Troponin I <0.30  <0.30 ng/mL  GLUCOSE, CAPILLARY     Status: Abnormal   Collection Time    08/10/13 12:12 AM      Result Value Range   Glucose-Capillary 133 (*) 70 - 99 mg/dL  GLUCOSE, CAPILLARY     Status: Abnormal   Collection Time    08/10/13  4:21 AM      Result Value Range   Glucose-Capillary 105 (*) 70 - 99 mg/dL  PROTIME-INR     Status: None   Collection Time    08/10/13  5:22 AM      Result Value Range   Prothrombin Time 13.4  11.6 - 15.2 seconds   INR 1.04  0.00 - 1.49  BASIC METABOLIC PANEL     Status: Abnormal   Collection Time    08/10/13  5:22 AM      Result Value Range   Sodium 132 (*) 135 - 145 mEq/L   Potassium 3.9  3.5 - 5.1 mEq/L   Chloride 101  96 - 112 mEq/L   CO2 19  19 - 32 mEq/L   Glucose, Bld 246 (*) 70 - 99 mg/dL   BUN 17  6 - 23 mg/dL   Creatinine, Ser 1.61  0.50 - 1.35 mg/dL   Calcium 8.9  8.4 - 09.6 mg/dL  GFR calc non Af Amer >90  >90 mL/min   GFR calc Af Amer >90  >90 mL/min  CBC      Status: Abnormal   Collection Time    08/10/13  5:22 AM      Result Value Range   WBC 9.3  4.0 - 10.5 K/uL   RBC 4.09 (*) 4.22 - 5.81 MIL/uL   Hemoglobin 12.8 (*) 13.0 - 17.0 g/dL   HCT 29.5 (*) 62.1 - 30.8 %   MCV 93.6  78.0 - 100.0 fL   MCH 31.3  26.0 - 34.0 pg   MCHC 33.4  30.0 - 36.0 g/dL   RDW 65.7  84.6 - 96.2 %   Platelets 192  150 - 400 K/uL  LIPID PANEL     Status: None   Collection Time    08/10/13  5:22 AM      Result Value Range   Cholesterol 156  0 - 200 mg/dL   Triglycerides 57  <952 mg/dL   HDL 73  >84 mg/dL   Total CHOL/HDL Ratio 2.1     VLDL 11  0 - 40 mg/dL   LDL Cholesterol 72  0 - 99 mg/dL  GLUCOSE, CAPILLARY     Status: Abnormal   Collection Time    08/10/13  8:18 AM      Result Value Range   Glucose-Capillary 299 (*) 70 - 99 mg/dL    Signed: Boykin Peek, MD 08/10/2013, 8:45 AM   Time Spent on Discharge: 35 minutes Services Ordered on Discharge: None Equipment Ordered on Discharge: None

## 2013-08-10 NOTE — Progress Notes (Signed)
  Date: 08/10/2013  Patient name: Dennis [name]  Medical record number: [id number]  Date of birth: 01/31/1961   This patient has been seen and the plan of care was discussed with the house staff. Please see their note for complete details. I concur with their findings with the following additions/corrections: He is doing well, CP free.  Appreciate CV f/u.  Continue to work on improving Glc control.   [name], MD 08/10/2013, 10:14 AM

## 2013-08-15 LAB — CULTURE, BLOOD (ROUTINE X 2)
Culture: NO GROWTH
Culture: NO GROWTH

## 2014-05-13 HISTORY — PX: CARDIAC CATHETERIZATION: SHX172

## 2014-05-15 ENCOUNTER — Emergency Department (HOSPITAL_COMMUNITY)
Admission: EM | Admit: 2014-05-15 | Discharge: 2014-05-15 | Disposition: A | Discharge status: Home / Self Care | Payer: Medicare Other | Attending: Emergency Medicine | Admitting: Emergency Medicine

## 2014-05-15 ENCOUNTER — Emergency Department (HOSPITAL_COMMUNITY): Payer: Medicare Other

## 2014-05-15 ENCOUNTER — Encounter (HOSPITAL_COMMUNITY): Payer: Self-pay | Admitting: Emergency Medicine

## 2014-05-15 DIAGNOSIS — R079 Chest pain, unspecified: Secondary | ICD-10-CM | POA: Insufficient documentation

## 2014-05-15 DIAGNOSIS — F1410 Cocaine abuse, uncomplicated: Secondary | ICD-10-CM | POA: Insufficient documentation

## 2014-05-15 DIAGNOSIS — F17200 Nicotine dependence, unspecified, uncomplicated: Secondary | ICD-10-CM | POA: Insufficient documentation

## 2014-05-15 DIAGNOSIS — R0602 Shortness of breath: Secondary | ICD-10-CM | POA: Insufficient documentation

## 2014-05-15 DIAGNOSIS — Z794 Long term (current) use of insulin: Secondary | ICD-10-CM | POA: Diagnosis not present

## 2014-05-15 DIAGNOSIS — I509 Heart failure, unspecified: Secondary | ICD-10-CM | POA: Insufficient documentation

## 2014-05-15 DIAGNOSIS — Z79899 Other long term (current) drug therapy: Secondary | ICD-10-CM | POA: Diagnosis not present

## 2014-05-15 DIAGNOSIS — E109 Type 1 diabetes mellitus without complications: Secondary | ICD-10-CM | POA: Diagnosis not present

## 2014-05-15 DIAGNOSIS — R911 Solitary pulmonary nodule: Secondary | ICD-10-CM | POA: Diagnosis not present

## 2014-05-15 DIAGNOSIS — Z7901 Long term (current) use of anticoagulants: Secondary | ICD-10-CM | POA: Diagnosis not present

## 2014-05-15 DIAGNOSIS — R5381 Other malaise: Secondary | ICD-10-CM | POA: Diagnosis not present

## 2014-05-15 DIAGNOSIS — R5383 Other fatigue: Secondary | ICD-10-CM | POA: Diagnosis not present

## 2014-05-15 DIAGNOSIS — R011 Cardiac murmur, unspecified: Secondary | ICD-10-CM | POA: Insufficient documentation

## 2014-05-15 DIAGNOSIS — M79609 Pain in unspecified limb: Secondary | ICD-10-CM

## 2014-05-15 DIAGNOSIS — Z7982 Long term (current) use of aspirin: Secondary | ICD-10-CM | POA: Insufficient documentation

## 2014-05-15 DIAGNOSIS — R110 Nausea: Secondary | ICD-10-CM | POA: Diagnosis not present

## 2014-05-15 LAB — CBC WITH DIFFERENTIAL/PLATELET
Basophils Absolute: 0 10*3/uL (ref 0.0–0.1)
Basophils Relative: 0 % (ref 0–1)
Eosinophils Absolute: 0.1 10*3/uL (ref 0.0–0.7)
Eosinophils Relative: 0 % (ref 0–5)
HCT: 40.7 % (ref 39.0–52.0)
Hemoglobin: 14 g/dL (ref 13.0–17.0)
Lymphocytes Relative: 14 % (ref 12–46)
Lymphs Abs: 1.9 10*3/uL (ref 0.7–4.0)
MCH: 31.6 pg (ref 26.0–34.0)
MCHC: 34.4 g/dL (ref 30.0–36.0)
MCV: 91.9 fL (ref 78.0–100.0)
Monocytes Absolute: 1.2 10*3/uL — ABNORMAL HIGH (ref 0.1–1.0)
Monocytes Relative: 9 % (ref 3–12)
Neutro Abs: 10.9 10*3/uL — ABNORMAL HIGH (ref 1.7–7.7)
Neutrophils Relative %: 77 % (ref 43–77)
Platelets: 238 10*3/uL (ref 150–400)
RBC: 4.43 MIL/uL (ref 4.22–5.81)
RDW: 12.3 % (ref 11.5–15.5)
WBC: 14.1 10*3/uL — ABNORMAL HIGH (ref 4.0–10.5)

## 2014-05-15 LAB — PROTIME-INR
INR: 1.12 (ref 0.00–1.49)
Prothrombin Time: 14.4 seconds (ref 11.6–15.2)

## 2014-05-15 LAB — BASIC METABOLIC PANEL
Anion gap: 15 (ref 5–15)
BUN: 13 mg/dL (ref 6–23)
CO2: 24 mEq/L (ref 19–32)
Calcium: 9.2 mg/dL (ref 8.4–10.5)
Chloride: 95 mEq/L — ABNORMAL LOW (ref 96–112)
Creatinine, Ser: 0.73 mg/dL (ref 0.50–1.35)
GFR calc Af Amer: >90 mL/min (ref >90)
GFR calc non Af Amer: >90 mL/min (ref >90)
Glucose, Bld: 404 mg/dL — ABNORMAL HIGH (ref 70–99)
Potassium: 3.8 mEq/L (ref 3.7–5.3)
Sodium: 134 mEq/L — ABNORMAL LOW (ref 137–147)

## 2014-05-15 LAB — PRO B NATRIURETIC PEPTIDE: Pro B Natriuretic peptide (BNP): 1920 pg/mL — ABNORMAL HIGH (ref 0–125)

## 2014-05-15 LAB — I-STAT TROPONIN, ED
Troponin i, poc: 0.01 ng/mL (ref 0.00–0.08)
Troponin i, poc: 0.01 ng/mL (ref 0.00–0.08)

## 2014-05-15 LAB — RAPID URINE DRUG SCREEN, HOSP PERFORMED
Amphetamines: NOT DETECTED
Barbiturates: NOT DETECTED
Benzodiazepines: NOT DETECTED
Cocaine: POSITIVE — AB
Opiates: NOT DETECTED
Tetrahydrocannabinol: NOT DETECTED

## 2014-05-15 LAB — LIPASE, BLOOD: Lipase: 17 U/L (ref 11–59)

## 2014-05-15 IMAGING — CR DG CHEST 2V
2 series · 2 of 2 positions shown · non-contrast
Comparison: [DATE]

CLINICAL DATA: Chest pain, shortness of breath, history of CHF

EXAM:
CHEST  2 VIEW

[w chest pa]
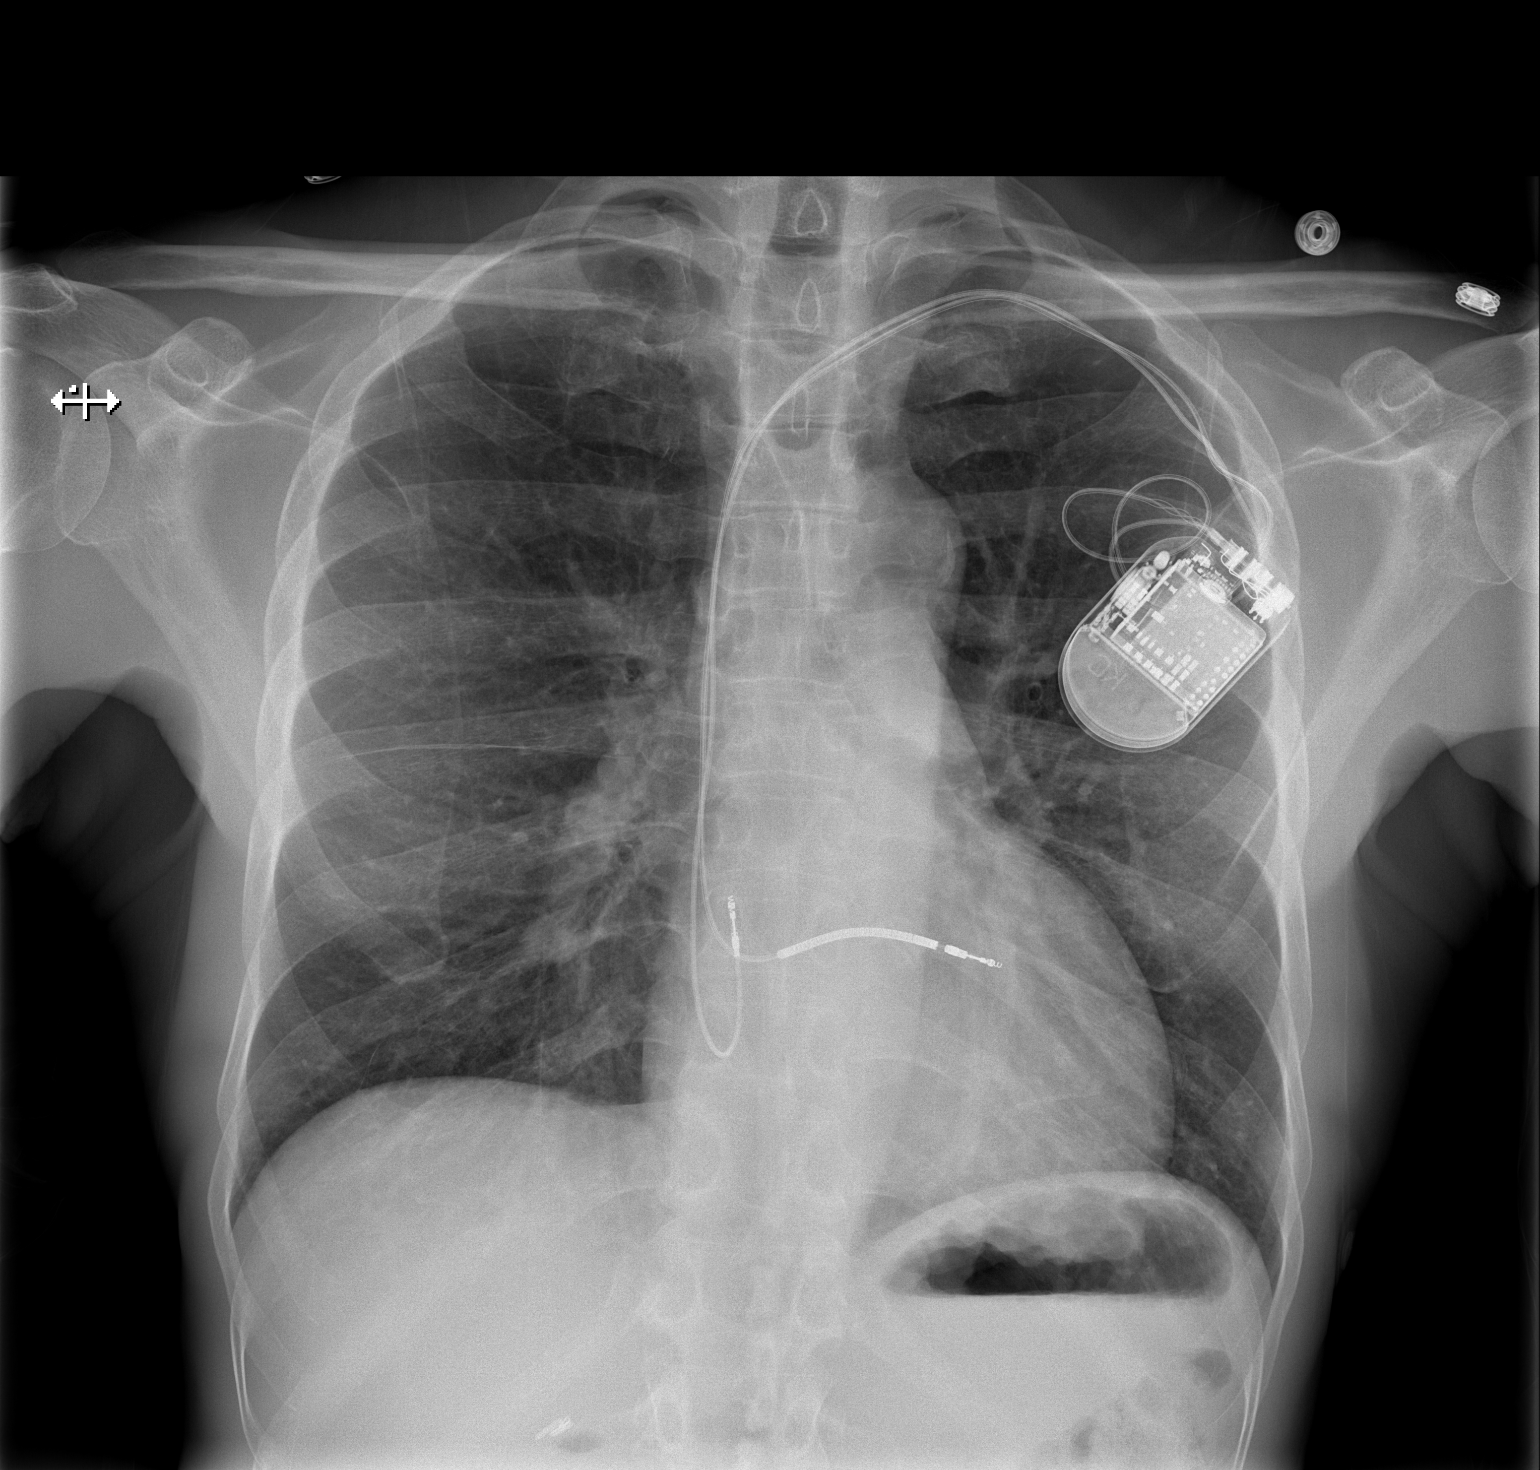

[w chest lat]
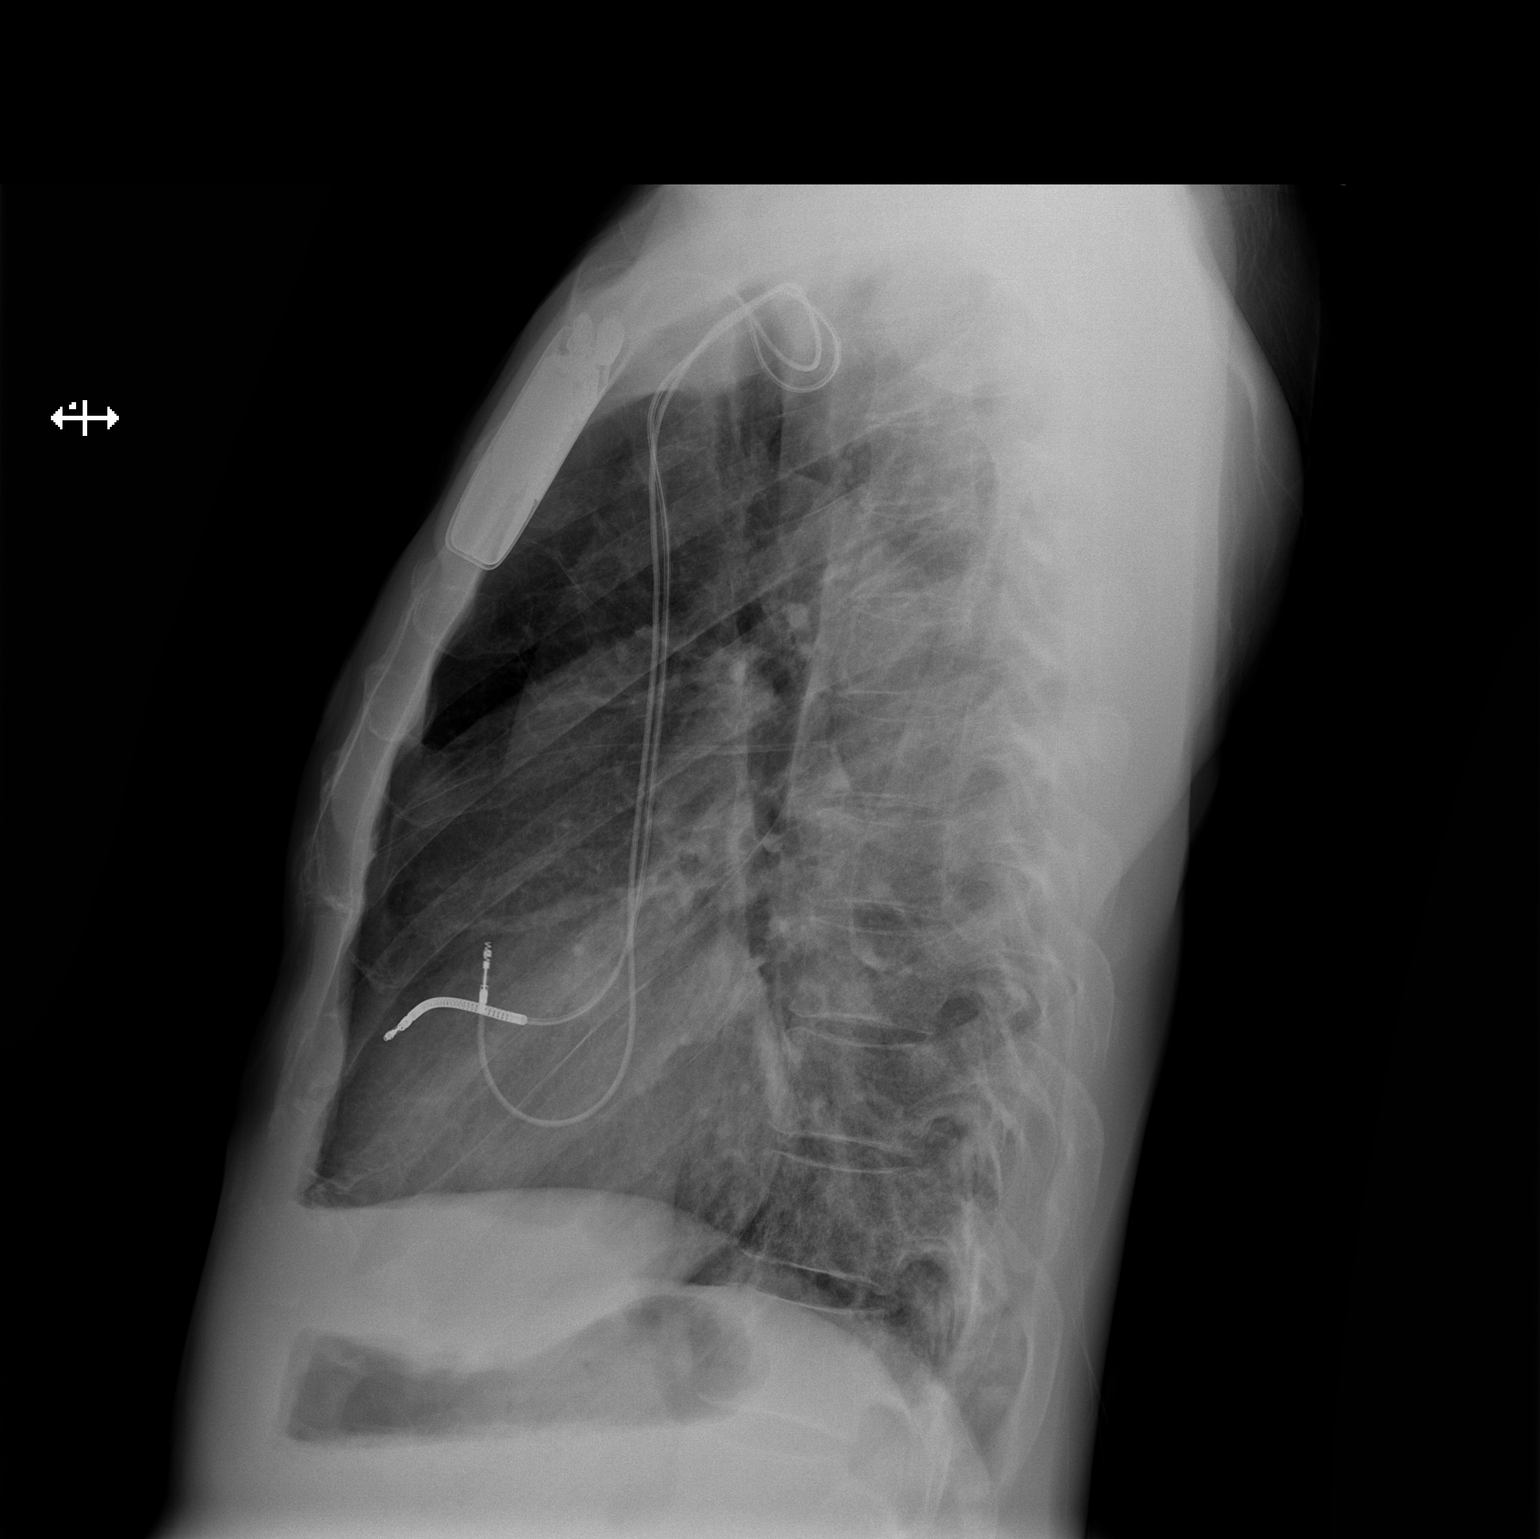

[2 of 2 positions shown; findings below may reference images not displayed]

FINDINGS: Normal heart size and vascularity. Left subclavian 2 lead pacer
noted. Lungs remain clear without focal pneumonia, collapse or
consolidation. No edema, pleural effusion or pneumothorax. Trachea
is midline. Atherosclerosis of the aorta.

Right lower lobe 15 mm nodular density noted medially, new since the
prior study which could be related to superimposed shadows versus a
developing right lower lobe nodule. Recommend follow-up nonemergent
chest CT.

Prior cholecystectomy noted.
IMPRESSION: Right lower lobe 15 mm nodular density versus developing nodule
warrants follow-up nonemergent chest CT

No superimposed acute chest process.

Stable postoperative findings.

These results will be called to the ordering clinician or
representative by the Radiologist Assistant, and communication
documented in the PACS or zVision Dashboard.

## 2014-05-15 MED ORDER — ONDANSETRON HCL 4 MG/2ML IJ SOLN
4.0000 mg | Freq: Once | INTRAMUSCULAR | Status: DC
  Filled 2014-05-15: qty 2

## 2014-05-15 MED ORDER — INSULIN ASPART 100 UNIT/ML ~~LOC~~ SOLN
6.0000 [IU] | Freq: Once | SUBCUTANEOUS | Status: AC
  Administered 2014-05-15: 6 [IU] via SUBCUTANEOUS
  Filled 2014-05-15: qty 1

## 2014-05-15 MED ORDER — MORPHINE SULFATE 4 MG/ML IJ SOLN
4.0000 mg | Freq: Once | INTRAMUSCULAR | Status: DC
  Filled 2014-05-15: qty 1

## 2014-05-15 MED ORDER — SODIUM CHLORIDE 0.9 % IV BOLUS (SEPSIS)
250.0000 mL | Freq: Once | INTRAVENOUS | Status: AC
  Administered 2014-05-15: 250 mL via INTRAVENOUS

## 2014-05-15 NOTE — Discharge Instructions (Signed)

## 2014-05-15 NOTE — Progress Notes (Signed)
*  PRELIMINARY RESULTS* Vascular Ultrasound Left lower extremity venous duplex has been completed.  Preliminary findings: no evidence of DVT or baker's cyst.  Landry Mellow, RDMS, RVT  05/15/2014, 9:49 AM

## 2014-05-15 NOTE — ED Provider Notes (Signed)
CSN: [id number]     Arrival date & time 05/15/14  7564 History   None    Chief Complaint  Patient presents with  . Chest Pain   Dennis [name] is a 53 yo AAM w/PMH of CAD (dilated ischemic cardiomyopathy on coumadin, seen by Dr. Erline Levine), IDDM, CHF w/EF <30%, previous drug user, and smoker who presents w/left sided CP starting at midnight last night. Pain is sharp, mostly non-radiating but sometimes radiates to his upper left back. Was associated w/nausea, but did not vomit. Accompanied by diaphoresis and SOB. Pain was initially an 8/10, but now down to 3/10. Denies any spicy food last night but did eat spicy food on Saturday. Has a hx of pancreatitis.  He denies any leg swelling, palpitations, cough, fever, chills, vomiting, diarrhea, abd pain, recent travel, sick contacts, or any drug use.   (Consider location/radiation/quality/duration/timing/severity/associated sxs/prior Treatment) Patient is a 53 y.o. male presenting with chest pain. The history is provided by the patient.  Chest Pain Pain location:  L chest Pain quality: sharp and stabbing   Pain radiates to the back: yes (off and on)   Pain severity:  Severe Onset quality:  Sudden Duration:  8 hours Timing:  Constant Progression:  Partially resolved Chronicity:  New Relieved by:  Nothing Ineffective treatments:  None tried Associated symptoms: diaphoresis, fatigue, nausea, shortness of breath and weakness   Associated symptoms: no abdominal pain, no cough, no dizziness, no fever, no numbness, no palpitations and not vomiting   Risk factors: coronary artery disease, diabetes mellitus, high cholesterol, male sex and smoking     Past Medical History  Diagnosis Date  . CHF (congestive heart failure)   . Drug abuse and dependence   . Automatic implantable cardioverter-defibrillator in situ   . Heart murmur   . Sleep apnea     "cleared after T&A" (08/09/2013)  . Exertional shortness of breath   . Type I diabetes mellitus    Past  Surgical History  Procedure Laterality Date  . Forearm fracture surgery Left 1980  . Cardiac defibrillator placement  03/2011  . Cholecystectomy  1994  . Tonsillectomy and adenoidectomy  ~ 1998   Family History  Problem Relation Age of Onset  . Diabetes Mother   . Heart disease Mother   . Diabetes Father   . Prostate cancer Father   . Heart disease Father   . Diabetes Brother   . Diabetes Brother    History  Substance Use Topics  . Smoking status: Current Every Day Smoker -- 0.50 packs/day for 35 years    Types: Cigarettes  . Smokeless tobacco: Never Used  . Alcohol Use: Yes     Comment: 08/09/2013 "might have a drink maybe once/month, if that"    Review of Systems  Constitutional: Positive for diaphoresis and fatigue. Negative for fever.  Respiratory: Positive for shortness of breath. Negative for cough, chest tightness, wheezing and stridor.   Cardiovascular: Positive for chest pain. Negative for palpitations and leg swelling.  Gastrointestinal: Positive for nausea. Negative for vomiting, abdominal pain and abdominal distention.  Neurological: Positive for weakness. Negative for dizziness and numbness.  All other systems reviewed and are negative.     Allergies  Review of patient's allergies indicates no known allergies.  Home Medications   Prior to Admission medications   Medication Sig Start Date End Date Taking? Authorizing Provider  aspirin 325 MG tablet Take 325 mg by mouth daily.   Yes Historical Provider, MD  carvedilol (COREG) 3.125 MG  tablet Take 3.125 mg by mouth 2 (two) times daily with a meal.   Yes Historical Provider, MD  insulin aspart (NOVOLOG) 100 UNIT/ML injection Inject 6-8 Units into the skin 3 (three) times daily before meals. Per sliding scale.   Yes Historical Provider, MD  insulin glargine (LANTUS) 100 UNIT/ML injection Inject 32 Units into the skin at bedtime.   Yes Historical Provider, MD  lisinopril (PRINIVIL,ZESTRIL) 2.5 MG tablet Take 2.5  mg by mouth daily.   Yes Historical Provider, MD  QC PEN NEEDLES 31G X 6 MM MISC 1 each by Other route See admin instructions. Use daily with insulin. 08/06/12  Yes Historical Provider, MD  simvastatin (ZOCOR) 80 MG tablet Take 80 mg by mouth at bedtime.   Yes Historical Provider, MD  spironolactone (ALDACTONE) 25 MG tablet Take 12.5 mg by mouth 2 (two) times daily.    Yes Historical Provider, MD  warfarin (COUMADIN) 2 MG tablet Take 2 mg by mouth daily. 2 mg in addition to 5 mg   Yes Historical Provider, MD  warfarin (COUMADIN) 5 MG tablet Take 5 mg by mouth daily. 5 mg in addition to 2 mg   Yes Historical Provider, MD   BP 103/70  Pulse 115  Resp 22  SpO2 96% Physical Exam  Nursing note and vitals reviewed. Constitutional: He is oriented to person, place, and time. He appears well-developed and well-nourished. No distress.  HENT:  Head: Normocephalic and atraumatic.  Cardiovascular: Normal heart sounds and intact distal pulses.   Pulmonary/Chest: Effort normal and breath sounds normal. He exhibits no tenderness.  Abdominal: Soft. He exhibits no distension. There is no tenderness.  Musculoskeletal: Normal range of motion.  Neurological: He is alert and oriented to person, place, and time.  Skin: Skin is warm and dry. He is not diaphoretic.    ED Course  Procedures (including critical care time) Labs Review Labs Reviewed  PRO B NATRIURETIC PEPTIDE - Abnormal; Notable for the following:    Pro B Natriuretic peptide (BNP) 1920.0 (*)    All other components within normal limits  CBC WITH DIFFERENTIAL - Abnormal; Notable for the following:    WBC 14.1 (*)    Neutro Abs 10.9 (*)    Monocytes Absolute 1.2 (*)    All other components within normal limits  BASIC METABOLIC PANEL - Abnormal; Notable for the following:    Sodium 134 (*)    Chloride 95 (*)    Glucose, Bld 404 (*)    All other components within normal limits  URINE RAPID DRUG SCREEN (HOSP PERFORMED) - Abnormal; Notable  for the following:    Cocaine POSITIVE (*)    All other components within normal limits  PROTIME-INR  LIPASE, BLOOD  I-STAT TROPOININ, ED  CBG MONITORING, ED  I-STAT TROPOININ, ED    Imaging Review Dg Chest 2 View  05/15/2014   CLINICAL DATA:  Chest pain, shortness of breath, history of CHF  EXAM: CHEST  2 VIEW  COMPARISON:  08/09/2013  FINDINGS: Normal heart size and vascularity. Left subclavian 2 lead pacer noted. Lungs remain clear without focal pneumonia, collapse or consolidation. No edema, pleural effusion or pneumothorax. Trachea is midline. Atherosclerosis of the aorta.  Right lower lobe 15 mm nodular density noted medially, new since the prior study which could be related to superimposed shadows versus a developing right lower lobe nodule. Recommend follow-up nonemergent chest CT.  Prior cholecystectomy noted.  IMPRESSION: Right lower lobe 15 mm nodular density versus developing nodule warrants follow-up  nonemergent chest CT  No superimposed acute chest process.  Stable postoperative findings.  These results will be called to the ordering clinician or representative by the Radiologist Assistant, and communication documented in the PACS or zVision Dashboard.   Electronically Signed   By: Daryll Brod M.D.   On: 05/15/2014 08:24     EKG Interpretation   Date/Time:  Monday May 15 2014 07:34:42 EDT Ventricular Rate:  112 PR Interval:  174 QRS Duration: 88 QT Interval:  342 QTC Calculation: 466 R Axis:   -25 Text Interpretation:  Sinus tachycardia with occasional Premature  ventricular complexes Biatrial enlargement Left ventricular hypertrophy  Nonspecific ST abnormality Confirmed by Ashok Cordia  MD, Lennette Bihari ([id number]) on  05/15/2014 8:02:28 AM      MDM   53 yo AAM w/CP. Please see HPI for details. On exam, pt in NAD, AFVSS aside from slight HoTN and tachycardia. Sating well on RA. W/known CHF will gently bolus. Pt has left sided CP concerning for ACS. EKG shows no STEMI. Will obtain  CXR, troponin, CBC, BMP, BNP, lipase, and coags. Given morphine and zofran for pain and nausea. Will hold off on nitro as pt is HoTN.  8:29 AM CXR w/no signs of volume overload or focal consolidations concerning for PNA. Lung nodule seen again. Istat Trop returned at 0.01. Subtherapeutic on coumadin at 1.12 (goal 2-2.5). Fluids going. Now HR down to 100 from 115.   Elevated WBC count at 14, but no obvious signs of infection. BNP elevated at 1920, but no physical signs of volume overload: no pleural effusions, no JVD, no LE edema. Likely due to worsening CHF. Glc elevated >400. Given 6U of novolog. Pt admits he does not regularly check his sugars (now about 1-2 times per week.) Lipase negative.   W/INR subtherapeutic and LLE pain will obtain US for DVT.  [place] negative for DVT. Pt resting comfortably w/no SOB. Tachycardia resolved after fluid bolus. Will repeat troponin after 3 hours.   Repeat troponin at 11:20AM also 0.01. Unchanged. While here, pt's pain has decreased to a 0-1/10. UDS + for cocaine, thought patient denies. W/no sign of acute cardiac ischemia, stable for DC home. Discussed w/him the need to closely manage his coumadin levels. Also, lung nodule on right lung. Pt will need CT imaging in future. Strict return precautions include any recurrent CP, especially w/SOB, diaphoresis.    Final diagnoses:  Chest pain, unspecified chest pain type  Lung nodule  SOB (shortness of breath)    Pt was seen under the supervision of Dr. Ashok Cordia.    Sherian Maroon, MD 05/15/14 1710

## 2014-05-15 NOTE — ED Notes (Signed)
Patient in NAD at time of departure

## 2014-05-15 NOTE — ED Notes (Signed)
Patient states chest pain last night, patient with hx of chf and aicd placement, patient with diaphoresis and sob last night

## 2014-05-16 NOTE — ED Provider Notes (Signed)
I saw and evaluated the patient, reviewed the resident's note and I agree with the findings and plan.   EKG Interpretation   Date/Time:  Monday May 15 2014 07:34:42 EDT Ventricular Rate:  112 PR Interval:  174 QRS Duration: 88 QT Interval:  342 QTC Calculation: 466 R Axis:   -25 Text Interpretation:  Sinus tachycardia with occasional Premature  ventricular complexes Biatrial enlargement Left ventricular hypertrophy  Nonspecific ST abnormality Confirmed by Ashok Cordia  MD, Lennette Bihari ([id number]) on  05/15/2014 8:02:28 AM      Pt c/o mid to left cp onset in past day/24 hrs. Constant. Dull. Non radiating. Not pleuritic. No sob.  Pt resting/sleeping comfortably throughout majority ed stay. Recheck no cp. Labs. Imaging.   Mirna Mires, MD 05/16/14 [phone]

## 2014-05-26 ENCOUNTER — Encounter (HOSPITAL_COMMUNITY): Payer: Self-pay | Admitting: Emergency Medicine

## 2014-05-26 ENCOUNTER — Emergency Department (HOSPITAL_COMMUNITY): Payer: Medicare Other

## 2014-05-26 ENCOUNTER — Inpatient Hospital Stay (HOSPITAL_COMMUNITY)
Admission: EM | Admit: 2014-05-26 | Discharge: 2014-06-01 | DRG: 287 | Disposition: A | Discharge status: Home / Self Care | Payer: Medicare Other | Attending: Internal Medicine | Admitting: Internal Medicine

## 2014-05-26 DIAGNOSIS — I10 Essential (primary) hypertension: Secondary | ICD-10-CM | POA: Diagnosis present

## 2014-05-26 DIAGNOSIS — I509 Heart failure, unspecified: Secondary | ICD-10-CM | POA: Diagnosis not present

## 2014-05-26 DIAGNOSIS — Z794 Long term (current) use of insulin: Secondary | ICD-10-CM

## 2014-05-26 DIAGNOSIS — I5023 Acute on chronic systolic (congestive) heart failure: Principal | ICD-10-CM

## 2014-05-26 DIAGNOSIS — F17200 Nicotine dependence, unspecified, uncomplicated: Secondary | ICD-10-CM | POA: Diagnosis present

## 2014-05-26 DIAGNOSIS — Z7982 Long term (current) use of aspirin: Secondary | ICD-10-CM

## 2014-05-26 DIAGNOSIS — R0600 Dyspnea, unspecified: Secondary | ICD-10-CM | POA: Diagnosis present

## 2014-05-26 DIAGNOSIS — E1169 Type 2 diabetes mellitus with other specified complication: Secondary | ICD-10-CM | POA: Diagnosis present

## 2014-05-26 DIAGNOSIS — I4891 Unspecified atrial fibrillation: Secondary | ICD-10-CM | POA: Diagnosis present

## 2014-05-26 DIAGNOSIS — E785 Hyperlipidemia, unspecified: Secondary | ICD-10-CM | POA: Diagnosis present

## 2014-05-26 DIAGNOSIS — E1139 Type 2 diabetes mellitus with other diabetic ophthalmic complication: Secondary | ICD-10-CM | POA: Diagnosis present

## 2014-05-26 DIAGNOSIS — IMO0002 Reserved for concepts with insufficient information to code with codable children: Secondary | ICD-10-CM

## 2014-05-26 DIAGNOSIS — F1920 Other psychoactive substance dependence, uncomplicated: Secondary | ICD-10-CM | POA: Diagnosis present

## 2014-05-26 DIAGNOSIS — G4733 Obstructive sleep apnea (adult) (pediatric): Secondary | ICD-10-CM | POA: Diagnosis present

## 2014-05-26 DIAGNOSIS — E1165 Type 2 diabetes mellitus with hyperglycemia: Secondary | ICD-10-CM | POA: Diagnosis present

## 2014-05-26 DIAGNOSIS — Z95810 Presence of automatic (implantable) cardiac defibrillator: Secondary | ICD-10-CM

## 2014-05-26 DIAGNOSIS — R0602 Shortness of breath: Secondary | ICD-10-CM | POA: Diagnosis present

## 2014-05-26 DIAGNOSIS — I2589 Other forms of chronic ischemic heart disease: Secondary | ICD-10-CM | POA: Diagnosis present

## 2014-05-26 DIAGNOSIS — Z9119 Patient's noncompliance with other medical treatment and regimen: Secondary | ICD-10-CM

## 2014-05-26 DIAGNOSIS — F1410 Cocaine abuse, uncomplicated: Secondary | ICD-10-CM | POA: Diagnosis present

## 2014-05-26 DIAGNOSIS — I428 Other cardiomyopathies: Secondary | ICD-10-CM

## 2014-05-26 DIAGNOSIS — Z91199 Patient's noncompliance with other medical treatment and regimen due to unspecified reason: Secondary | ICD-10-CM

## 2014-05-26 DIAGNOSIS — J40 Bronchitis, not specified as acute or chronic: Secondary | ICD-10-CM | POA: Diagnosis present

## 2014-05-26 DIAGNOSIS — Z79899 Other long term (current) drug therapy: Secondary | ICD-10-CM

## 2014-05-26 DIAGNOSIS — I959 Hypotension, unspecified: Secondary | ICD-10-CM | POA: Diagnosis not present

## 2014-05-26 DIAGNOSIS — Z7901 Long term (current) use of anticoagulants: Secondary | ICD-10-CM

## 2014-05-26 HISTORY — DX: Other cardiomyopathies: I42.8

## 2014-05-26 HISTORY — DX: Pneumonia, unspecified organism: J18.9

## 2014-05-26 LAB — D-DIMER, QUANTITATIVE (NOT AT ARMC): D-Dimer, Quant: <0.27 ug/mL-FEU (ref 0.00–0.48)

## 2014-05-26 LAB — I-STAT TROPONIN, ED: Troponin i, poc: 0.01 ng/mL (ref 0.00–0.08)

## 2014-05-26 LAB — BASIC METABOLIC PANEL
Anion gap: 11 (ref 5–15)
BUN: 8 mg/dL (ref 6–23)
CO2: 25 mEq/L (ref 19–32)
Calcium: 8.8 mg/dL (ref 8.4–10.5)
Chloride: 105 mEq/L (ref 96–112)
Creatinine, Ser: 0.83 mg/dL (ref 0.50–1.35)
GFR calc Af Amer: >90 mL/min (ref >90)
GFR calc non Af Amer: >90 mL/min (ref >90)
Glucose, Bld: 232 mg/dL — ABNORMAL HIGH (ref 70–99)
Potassium: 4.1 mEq/L (ref 3.7–5.3)
Sodium: 141 mEq/L (ref 137–147)

## 2014-05-26 LAB — TSH: TSH: 0.934 u[IU]/mL (ref 0.350–4.500)

## 2014-05-26 LAB — RAPID URINE DRUG SCREEN, HOSP PERFORMED
Amphetamines: NOT DETECTED
Barbiturates: NOT DETECTED
Benzodiazepines: NOT DETECTED
Cocaine: POSITIVE — AB
Opiates: NOT DETECTED
Tetrahydrocannabinol: NOT DETECTED

## 2014-05-26 LAB — PROTIME-INR
INR: 1.48 (ref 0.00–1.49)
Prothrombin Time: 17.9 seconds — ABNORMAL HIGH (ref 11.6–15.2)

## 2014-05-26 LAB — GLUCOSE, CAPILLARY: Glucose-Capillary: 323 mg/dL — ABNORMAL HIGH (ref 70–99)

## 2014-05-26 LAB — TROPONIN I
Troponin I: <0.3 ng/mL (ref <0.30)
Troponin I: <0.3 ng/mL (ref <0.30)

## 2014-05-26 LAB — CBC
HCT: 34.9 % — ABNORMAL LOW (ref 39.0–52.0)
Hemoglobin: 11.1 g/dL — ABNORMAL LOW (ref 13.0–17.0)
MCH: 31.7 pg (ref 26.0–34.0)
MCHC: 31.8 g/dL (ref 30.0–36.0)
MCV: 99.7 fL (ref 78.0–100.0)
Platelets: 261 10*3/uL (ref 150–400)
RBC: 3.5 MIL/uL — ABNORMAL LOW (ref 4.22–5.81)
RDW: 13.3 % (ref 11.5–15.5)
WBC: 9.2 10*3/uL (ref 4.0–10.5)

## 2014-05-26 LAB — PRO B NATRIURETIC PEPTIDE: Pro B Natriuretic peptide (BNP): 1034 pg/mL — ABNORMAL HIGH (ref 0–125)

## 2014-05-26 IMAGING — CR DG CHEST 2V
2 series · 2 of 2 positions shown · non-contrast
Comparison: [DATE].

CLINICAL DATA: Dry cough and shortness of breath. Left-sided chest
pain.

EXAM:
CHEST  2 VIEW

[w chest pa]
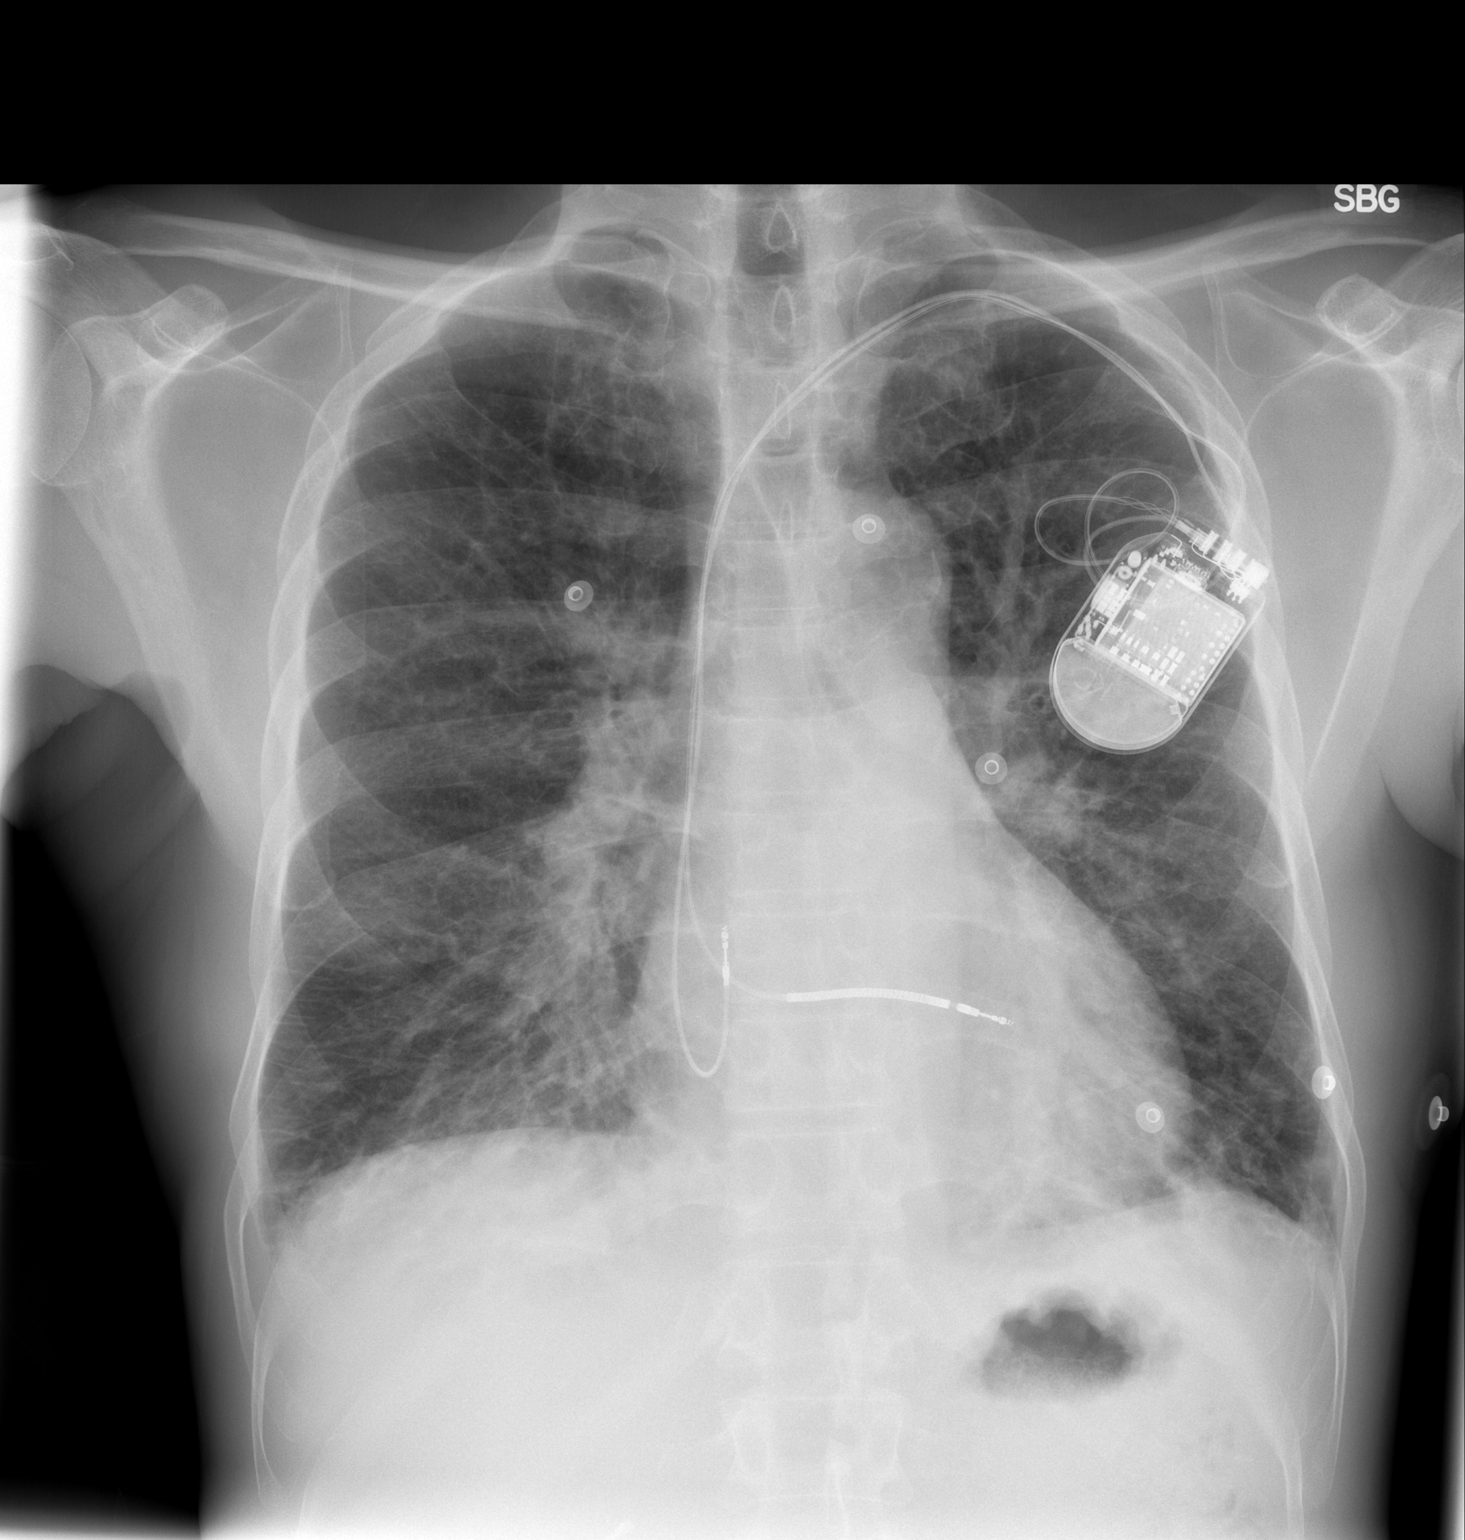

[w chest lat]
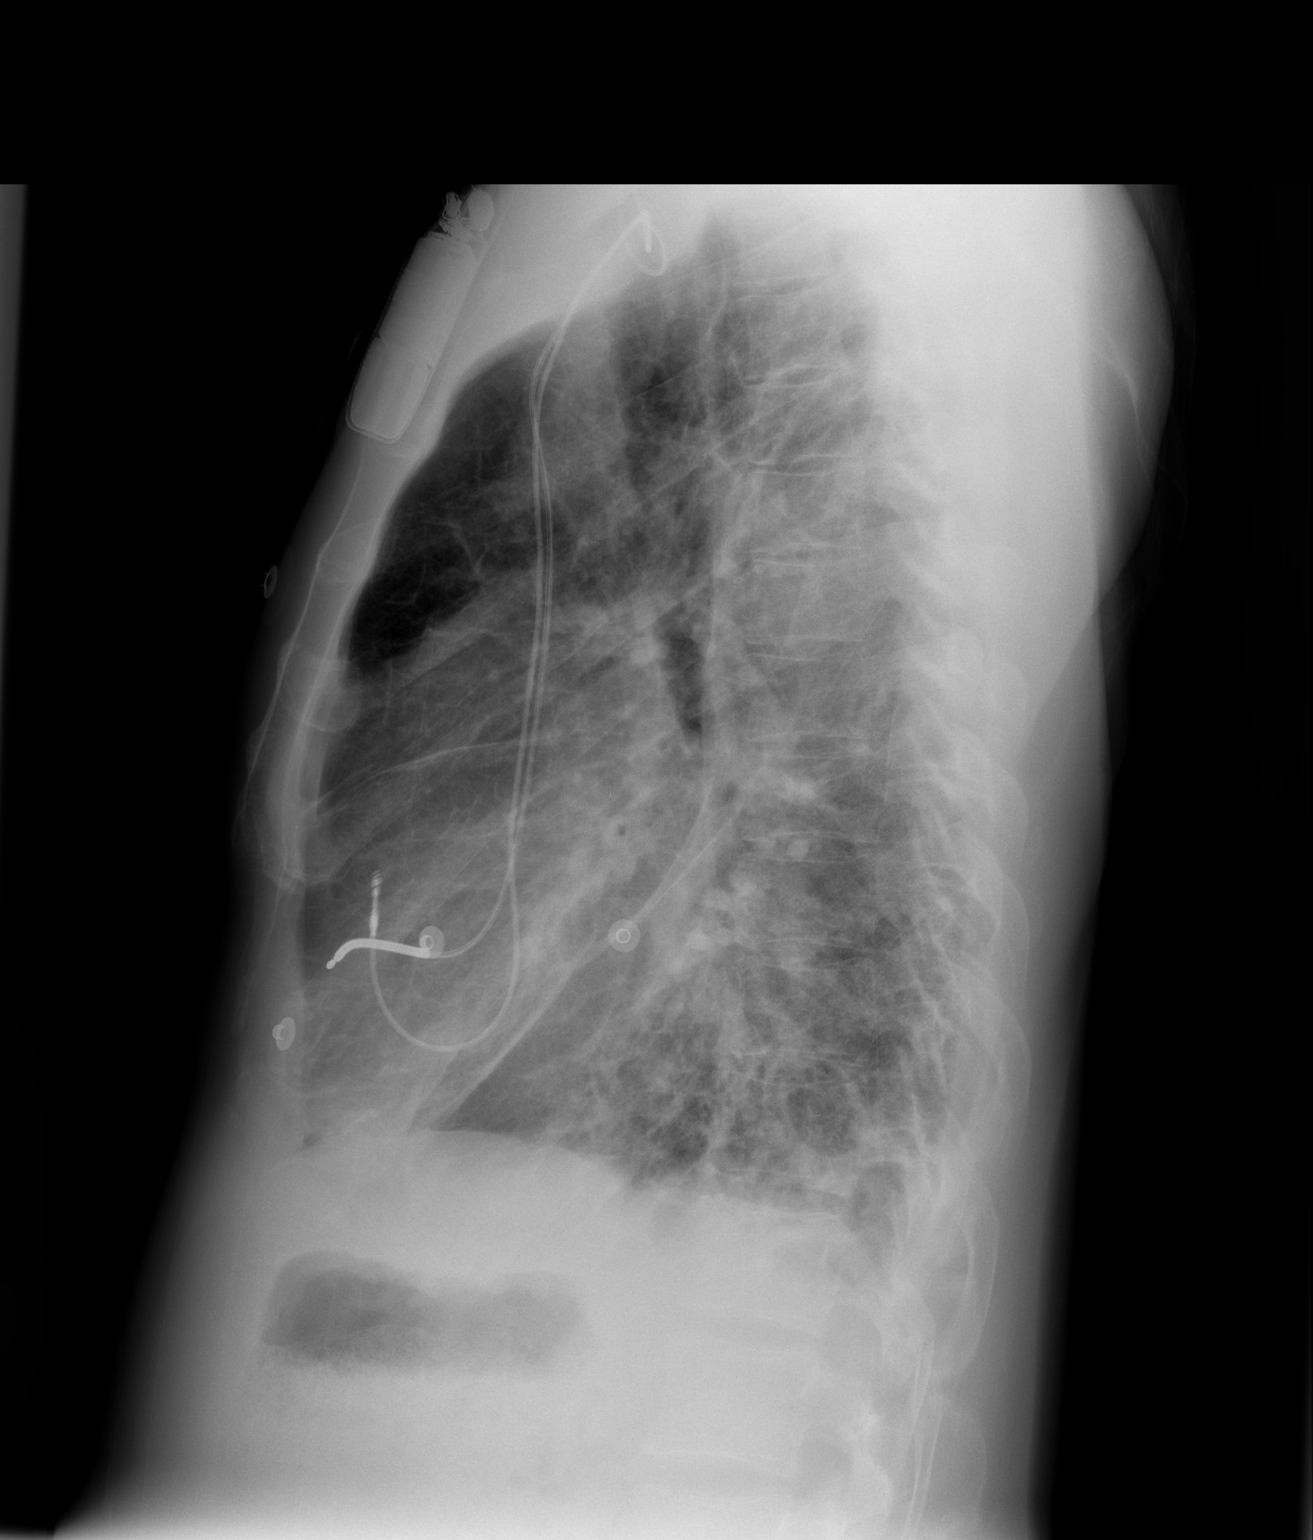

[2 of 2 positions shown; findings below may reference images not displayed]

FINDINGS: Trachea is midline. Heart size stable. Left subclavian pacemaker
lead tips are in the right atrium and right ventricle. There is new
basilar predominant interstitial prominence and indistinctness with
peripheral septal lines. No focal airspace consolidation. Tiny
bilateral pleural effusions.
IMPRESSION: 1. Mild congestive heart failure.
2. Previously seen nodular density in the right infrahilar region is
poorly appreciated.

## 2014-05-26 MED ORDER — WARFARIN - PHARMACIST DOSING INPATIENT
Freq: Every day | Status: DC
  Administered 2014-05-27 – 2014-05-31 (×4)

## 2014-05-26 MED ORDER — ACETAMINOPHEN 325 MG PO TABS
650.0000 mg | ORAL_TABLET | Freq: Four times a day (QID) | ORAL | Status: DC | PRN
  Administered 2014-05-28: 650 mg via ORAL
  Filled 2014-05-26: qty 2

## 2014-05-26 MED ORDER — FUROSEMIDE 10 MG/ML IJ SOLN
60.0000 mg | Freq: Once | INTRAMUSCULAR | Status: AC
  Administered 2014-05-26: 60 mg via INTRAVENOUS
  Filled 2014-05-26: qty 6

## 2014-05-26 MED ORDER — ATORVASTATIN CALCIUM 40 MG PO TABS
40.0000 mg | ORAL_TABLET | Freq: Every day | ORAL | Status: DC
  Administered 2014-05-27 – 2014-05-31 (×5): 40 mg via ORAL
  Filled 2014-05-26 (×7): qty 1

## 2014-05-26 MED ORDER — SODIUM CHLORIDE 0.9 % IJ SOLN: 3.0000 mL | INTRAMUSCULAR | Status: DC | PRN

## 2014-05-26 MED ORDER — INSULIN GLARGINE 100 UNIT/ML ~~LOC~~ SOLN
20.0000 [IU] | Freq: Every day | SUBCUTANEOUS | Status: DC
  Administered 2014-05-26: 20 [IU] via SUBCUTANEOUS
  Filled 2014-05-26 (×2): qty 0.2

## 2014-05-26 MED ORDER — SODIUM CHLORIDE 0.9 % IJ SOLN
3.0000 mL | Freq: Two times a day (BID) | INTRAMUSCULAR | Status: DC
Start: 2014-05-26 — End: 2014-06-01
  Administered 2014-05-26 – 2014-06-01 (×9): 3 mL via INTRAVENOUS

## 2014-05-26 MED ORDER — SODIUM CHLORIDE 0.9 % IV SOLN
INTRAVENOUS | Status: DC
  Administered 2014-05-26: 12:00:00 via INTRAVENOUS

## 2014-05-26 MED ORDER — INSULIN ASPART 100 UNIT/ML ~~LOC~~ SOLN
0.0000 [IU] | Freq: Three times a day (TID) | SUBCUTANEOUS | Status: DC
  Administered 2014-05-27: 7 [IU] via SUBCUTANEOUS
  Administered 2014-05-27: 9 [IU] via SUBCUTANEOUS
  Administered 2014-05-27: 1 [IU] via SUBCUTANEOUS
  Administered 2014-05-28: 9 [IU] via SUBCUTANEOUS
  Administered 2014-05-28 – 2014-05-29 (×3): 5 [IU] via SUBCUTANEOUS
  Administered 2014-05-29 (×2): 2 [IU] via SUBCUTANEOUS

## 2014-05-26 MED ORDER — LOSARTAN POTASSIUM 25 MG PO TABS
25.0000 mg | ORAL_TABLET | Freq: Every day | ORAL | Status: DC
  Administered 2014-05-26 – 2014-05-31 (×5): 25 mg via ORAL
  Filled 2014-05-26 (×7): qty 1

## 2014-05-26 MED ORDER — WARFARIN SODIUM 10 MG PO TABS
10.0000 mg | ORAL_TABLET | Freq: Once | ORAL | Status: DC
Start: 2014-05-27 — End: 2014-05-27
  Filled 2014-05-26: qty 1

## 2014-05-26 MED ORDER — SODIUM CHLORIDE 0.9 % IV SOLN: 250.0000 mL | INTRAVENOUS | Status: DC | PRN

## 2014-05-26 MED ORDER — CARVEDILOL 3.125 MG PO TABS
3.1250 mg | ORAL_TABLET | Freq: Two times a day (BID) | ORAL | Status: DC
  Administered 2014-05-27: 3.125 mg via ORAL
  Filled 2014-05-26 (×3): qty 1

## 2014-05-26 MED ORDER — ACETAMINOPHEN 650 MG RE SUPP: 650.0000 mg | Freq: Four times a day (QID) | RECTAL | Status: DC | PRN

## 2014-05-26 MED ORDER — FUROSEMIDE 20 MG PO TABS
20.0000 mg | ORAL_TABLET | Freq: Every day | ORAL | Status: DC
  Filled 2014-05-26: qty 1

## 2014-05-26 MED ORDER — HEPARIN SODIUM (PORCINE) 5000 UNIT/ML IJ SOLN: 5000.0000 [IU] | Freq: Three times a day (TID) | INTRAMUSCULAR | Status: DC

## 2014-05-26 MED ORDER — SODIUM CHLORIDE 0.9 % IJ SOLN
3.0000 mL | Freq: Two times a day (BID) | INTRAMUSCULAR | Status: DC
  Administered 2014-05-28 – 2014-06-01 (×4): 3 mL via INTRAVENOUS

## 2014-05-26 MED ORDER — ASPIRIN EC 81 MG PO TBEC
81.0000 mg | DELAYED_RELEASE_TABLET | Freq: Every day | ORAL | Status: DC
  Administered 2014-05-26 – 2014-06-01 (×7): 81 mg via ORAL
  Filled 2014-05-26 (×8): qty 1

## 2014-05-26 MED ORDER — ASPIRIN 81 MG PO CHEW
CHEWABLE_TABLET | ORAL | Status: AC
  Filled 2014-05-26: qty 1

## 2014-05-26 NOTE — ED Provider Notes (Signed)
CSN: [id number]     Arrival date & time 05/26/14  1111 History   First MD Initiated Contact with Patient 05/26/14 1118     Chief Complaint  Patient presents with  . Shortness of Breath  . Cough  . Chest Pain     (Consider location/radiation/quality/duration/timing/severity/associated sxs/prior Treatment) HPI Comments: Patient here complaining of cough and dyspnea x3 weeks which is been worse the last 3 days. He notes orthopnea and dyspnea on exertion. Does have a history of cardiomyopathy with a known EF of about 30%. Also notes sharp chest pain worse with coughing. Denies any anginal type qualities to this. No lower extremity edema noted. Patient seen here 10 days ago for similar symptoms and was noted to have a positive UDS for cocaine. Denies any fever or chills. No vomiting or diarrhea. Symptoms persistent and nothing makes them better. No new treatments used prior to arrival.  Patient is a 53 y.o. male presenting with shortness of breath, cough, and chest pain. The history is provided by the patient.  Shortness of Breath Associated symptoms: chest pain and cough   Cough Associated symptoms: chest pain and shortness of breath   Chest Pain Associated symptoms: cough and shortness of breath     Past Medical History  Diagnosis Date  . CHF (congestive heart failure)   . Drug abuse and dependence   . Automatic implantable cardioverter-defibrillator in situ   . Heart murmur   . Sleep apnea     "cleared after T&A" (08/09/2013)  . Exertional shortness of breath   . Type I diabetes mellitus    Past Surgical History  Procedure Laterality Date  . Forearm fracture surgery Left 1980  . Cardiac defibrillator placement  03/2011  . Cholecystectomy  1994  . Tonsillectomy and adenoidectomy  ~ 1998   Family History  Problem Relation Age of Onset  . Diabetes Mother   . Heart disease Mother   . Diabetes Father   . Prostate cancer Father   . Heart disease Father   . Diabetes Brother   .  Diabetes Brother    History  Substance Use Topics  . Smoking status: Current Every Day Smoker -- 0.50 packs/day for 35 years    Types: Cigarettes  . Smokeless tobacco: Never Used  . Alcohol Use: Yes     Comment: 08/09/2013 "might have a drink maybe once/month, if that"    Review of Systems  Respiratory: Positive for cough and shortness of breath.   Cardiovascular: Positive for chest pain.  All other systems reviewed and are negative.     Allergies  Review of patient's allergies indicates no known allergies.  Home Medications   Prior to Admission medications   Medication Sig Start Date End Date Taking? Authorizing Provider  aspirin 325 MG tablet Take 325 mg by mouth daily.    Historical Provider, MD  carvedilol (COREG) 3.125 MG tablet Take 3.125 mg by mouth 2 (two) times daily with a meal.    Historical Provider, MD  insulin aspart (NOVOLOG) 100 UNIT/ML injection Inject 6-8 Units into the skin 3 (three) times daily before meals. Per sliding scale.    Historical Provider, MD  insulin glargine (LANTUS) 100 UNIT/ML injection Inject 32 Units into the skin at bedtime.    Historical Provider, MD  lisinopril (PRINIVIL,ZESTRIL) 2.5 MG tablet Take 2.5 mg by mouth daily.    Historical Provider, MD  QC PEN NEEDLES 31G X 6 MM MISC 1 each by Other route See admin instructions. Use daily  with insulin. 08/06/12   Historical Provider, MD  simvastatin (ZOCOR) 80 MG tablet Take 80 mg by mouth at bedtime.    Historical Provider, MD  spironolactone (ALDACTONE) 25 MG tablet Take 12.5 mg by mouth 2 (two) times daily.     Historical Provider, MD  warfarin (COUMADIN) 2 MG tablet Take 2 mg by mouth daily. 2 mg in addition to 5 mg    Historical Provider, MD  warfarin (COUMADIN) 5 MG tablet Take 5 mg by mouth daily. 5 mg in addition to 2 mg    Historical Provider, MD   BP 94/62  Pulse 103  Temp(Src) 98.6 F (37 C) (Oral)  Resp 20  Ht 6' (1.829 m)  Wt 155 lb (70.308 kg)  BMI 21.02 kg/m2  SpO2  95% Physical Exam  Nursing note and vitals reviewed. Constitutional: He is oriented to person, place, and time. He appears well-developed and well-nourished.  Non-toxic appearance. No distress.  HENT:  Head: Normocephalic and atraumatic.  Eyes: Conjunctivae, EOM and lids are normal. Pupils are equal, round, and reactive to light.  Neck: Normal range of motion. Neck supple. No tracheal deviation present. No mass present.  Cardiovascular: Normal rate, regular rhythm and normal heart sounds.  Exam reveals no gallop.   No murmur heard. Pulmonary/Chest: Effort normal. No stridor. No respiratory distress. He has no decreased breath sounds. He has no wheezes. He has rhonchi. He has rales.  Abdominal: Soft. Normal appearance and bowel sounds are normal. He exhibits no distension. There is no tenderness. There is no rebound and no CVA tenderness.  Musculoskeletal: Normal range of motion. He exhibits no edema and no tenderness.  Neurological: He is alert and oriented to person, place, and time. He has normal strength. No cranial nerve deficit or sensory deficit. GCS eye subscore is 4. GCS verbal subscore is 5. GCS motor subscore is 6.  Skin: Skin is warm and dry. No abrasion and no rash noted.  Psychiatric: He has a normal mood and affect. His speech is normal and behavior is normal.    ED Course  Procedures (including critical care time) Labs Review Labs Reviewed  CBC  BASIC METABOLIC PANEL  PRO B NATRIURETIC PEPTIDE  TROPONIN I  PROTIME-INR  D-DIMER, QUANTITATIVE  I-STAT [place], ED    Imaging Review No results found.   EKG Interpretation None      Date: 05/26/2014  Rate: 101  Rhythm: sinus tachycardia  QRS Axis: normal  Intervals: normal  ST/T Wave abnormalities: nonspecific T wave changes and nonspecific ST/T changes  Conduction Disutrbances:none  Narrative Interpretation:   Old EKG Reviewed: none available    MDM   Final diagnoses:  None     Patient to be  admitted for treatment of CHF   Leota Jacobsen, MD 05/28/14 1257

## 2014-05-26 NOTE — ED Notes (Signed)
Reports non productive cough for 3 weeks; increasing shortness of breath for last 2 days; intermittent heaviness in chest alternating with sharp chest pains for last two days; Took 325mg  ASA at home. Denies pain at this time; sitting up because feels short of breath. 95% on room air now.

## 2014-05-26 NOTE — H&P (Signed)
Date: 05/26/2014               Patient Name:  Dennis [name] MRN: [id number]  DOB: 07-Jul-1961 Age / Sex: 53 y.o., male   PCP: Minda Ditto, MD         Medical Service: Internal Medicine Teaching Service         Attending Physician: Dr. Axel Filler, MD    First Contact: Dr. Genene Churn Pager: [phone]  Second Contact: Dr. Hayes Ludwig Pager: [phone]       After Hours (After 5p/  First Contact Pager: [phone]  weekends / holidays): Second Contact Pager: [phone]   Chief Complaint: SOB and cough.  History of Present Illness:   53 yo male with hx of CHF - ischemic dialted cardiomyopathy , HTN, cocaine abuse, DM Type I, comes in with SOB and cough for 3 weeks. SOB worsened this morning. Having dry cough for last 3 weeks. Denies any leg swelling. Has night time orthopnea, nocturnal cough, some chills. Had some sharp chest pain lasting few secs and resolving. No longer has CP currently.  Saw cardiologits Dr. Marzetta Board at [facility] weeks ago. Has hx of cardiomyopathy, EF 30% per patient, some questionable hx of Afib - on coumadin - currently subtherapeutic INR.   Had diarrhea 2 days ago that resolved.  Afib - on coumadin. Has defibrillator in place.   Received lasix 60mg  IV in ED, diureses ~2500 mL.   Meds: Current Facility-Administered Medications  Medication Dose Route Frequency Provider Last Rate Last Dose  . 0.9 %  sodium chloride infusion  250 mL Intravenous PRN Blain Pais, MD      . acetaminophen (TYLENOL) tablet 650 mg  650 mg Oral Q6H PRN Blain Pais, MD       Or  . acetaminophen (TYLENOL) suppository 650 mg  650 mg Rectal Q6H PRN Blain Pais, MD      . aspirin EC tablet 81 mg  81 mg Oral Daily Blain Pais, MD      . Derrill Memo ON 05/27/2014] atorvastatin (LIPITOR) tablet 40 mg  40 mg Oral q1800 Blain Pais, MD      . Derrill Memo ON 05/27/2014] carvedilol (COREG) tablet 3.125 mg  3.125 mg Oral BID WC Blain Pais, MD      . Derrill Memo ON 05/27/2014]  furosemide (LASIX) tablet 20 mg  20 mg Oral Daily Blain Pais, MD      . heparin injection 5,000 Units  5,000 Units Subcutaneous 3 times per day Blain Pais, MD      . Derrill Memo ON 05/27/2014] insulin aspart (novoLOG) injection 0-9 Units  0-9 Units Subcutaneous TID WC Blain Pais, MD      . insulin glargine (LANTUS) injection 20 Units  20 Units Subcutaneous QHS Blain Pais, MD      . losartan (COZAAR) tablet 25 mg  25 mg Oral Daily Blain Pais, MD      . sodium chloride 0.9 % injection 3 mL  3 mL Intravenous Q12H Blain Pais, MD      . sodium chloride 0.9 % injection 3 mL  3 mL Intravenous Q12H Blain Pais, MD      . sodium chloride 0.9 % injection 3 mL  3 mL Intravenous PRN Blain Pais, MD        Allergies: Allergies as of 05/26/2014  . (No Known Allergies)   Past Medical History  Diagnosis Date  . CHF (congestive heart failure)   .  Drug abuse and dependence   . Automatic implantable cardioverter-defibrillator in situ   . Heart murmur   . Sleep apnea     "cleared after T&A" (08/09/2013)  . Exertional shortness of breath   . Type I diabetes mellitus    Past Surgical History  Procedure Laterality Date  . Forearm fracture surgery Left 1980  . Cardiac defibrillator placement  03/2011  . Cholecystectomy  1994  . Tonsillectomy and adenoidectomy  ~ 1998   Family History  Problem Relation Age of Onset  . Diabetes Mother   . Heart disease Mother   . Diabetes Father   . Prostate cancer Father   . Heart disease Father   . Diabetes Brother   . Diabetes Brother    History   Social History  . Marital Status: Single    Spouse Name: N/A    Number of Children: 1  . Years of Education: N/A   Occupational History  . Disabled    Social History Main Topics  . Smoking status: Current Every Day Smoker -- 0.50 packs/day for 35 years    Types: Cigarettes  . Smokeless tobacco: Never Used  . Alcohol Use: Yes     Comment:  08/09/2013 "might have a drink maybe once/month, if that"  . Drug Use: Yes    Special: Cocaine     Comment: 08/09/2013 "last cocaine was ~ 3-4 wk ago"  . Sexual Activity: Yes   Other Topics Concern  . Not on file   Social History Narrative  . No narrative on file    Review of Systems: Review of Systems  Constitutional: Positive for chills, malaise/fatigue and diaphoresis. Negative for fever and weight loss.  HENT: Negative for ear pain and sore throat.   Eyes: Negative.   Respiratory: Positive for cough, sputum production and shortness of breath. Negative for hemoptysis and wheezing.   Cardiovascular: Positive for chest pain and orthopnea. Negative for palpitations, claudication, leg swelling and PND.  Gastrointestinal: Positive for diarrhea. Negative for vomiting, constipation, blood in stool and melena.  Genitourinary: Negative.   Musculoskeletal: Negative for back pain, falls, joint pain, myalgias and neck pain.  Skin: Negative.   Neurological: Negative.  Negative for weakness and headaches.  Endo/Heme/Allergies: Negative.   Psychiatric/Behavioral: Negative.      Physical Exam: Blood pressure 103/89, pulse 70, temperature 98.6 F (37 C), temperature source Oral, resp. rate 18, height 6\' 1"  (1.854 m), weight 71.895 kg (158 lb 8 oz), SpO2 100.00%. Physical Exam  Constitutional: He is oriented to person, place, and time. He appears well-developed and well-nourished. No distress.  HENT:  Head: Normocephalic and atraumatic.  Right Ear: External ear normal.  Left Ear: External ear normal.  Eyes: Conjunctivae and EOM are normal. Pupils are equal, round, and reactive to light. Right eye exhibits no discharge. Left eye exhibits no discharge.  Neck: Normal range of motion. Neck supple. JVD present.  Cardiovascular: Normal rate and regular rhythm.  Exam reveals no gallop and no friction rub.   No murmur heard. Respiratory: Effort normal and breath sounds normal. No respiratory  distress. He has no wheezes. He has no rales. He exhibits no tenderness.  GI: Soft. Bowel sounds are normal. He exhibits no distension and no mass. There is no tenderness. There is no rebound and no guarding.  Musculoskeletal: Normal range of motion. He exhibits no edema and no tenderness.  Neurological: He is alert and oriented to person, place, and time. He has normal reflexes. No cranial nerve deficit.  Skin: Skin is warm. He is not diaphoretic.  Psychiatric: He has a normal mood and affect.     Lab results: Basic Metabolic Panel:  Recent Labs  05/26/14 1138  NA 141  K 4.1  CL 105  CO2 25  GLUCOSE 232*  BUN 8  CREATININE 0.83  CALCIUM 8.8   Liver Function Tests: No results found for this basename: AST, ALT, ALKPHOS, BILITOT, PROT, ALBUMIN,  in the last 72 hours No results found for this basename: LIPASE, AMYLASE,  in the last 72 hours No results found for this basename: AMMONIA,  in the last 72 hours CBC:  Recent Labs  05/26/14 1138  WBC 9.2  HGB 11.1*  HCT 34.9*  MCV 99.7  PLT 261   Cardiac Enzymes:  Recent Labs  05/26/14 1138  TROPONINI <0.30   BNP:  Recent Labs  05/26/14 1138  PROBNP 1034.0*   D-Dimer:  Recent Labs  05/26/14 1138  DDIMER <0.27   CBG: No results found for this basename: GLUCAP,  in the last 72 hours Hemoglobin A1C: No results found for this basename: HGBA1C,  in the last 72 hours Fasting Lipid Panel: No results found for this basename: CHOL, HDL, LDLCALC, TRIG, CHOLHDL, LDLDIRECT,  in the last 72 hours Thyroid Function Tests: No results found for this basename: TSH, T4TOTAL, FREET4, T3FREE, THYROIDAB,  in the last 72 hours Anemia Panel: No results found for this basename: VITAMINB12, FOLATE, FERRITIN, TIBC, IRON, RETICCTPCT,  in the last 72 hours Coagulation:  Recent Labs  05/26/14 1138  LABPROT 17.9*  INR 1.48   Urine Drug Screen: Drugs of Abuse     Component Value Date/Time   LABOPIA NONE DETECTED 05/26/2014 1423    COCAINSCRNUR POSITIVE* 05/26/2014 1423   LABBENZ NONE DETECTED 05/26/2014 1423   AMPHETMU NONE DETECTED 05/26/2014 1423   THCU NONE DETECTED 05/26/2014 1423   LABBARB NONE DETECTED 05/26/2014 1423    Alcohol Level: No results found for this basename: ETH,  in the last 72 hours Urinalysis: No results found for this basename: COLORURINE, APPERANCEUR, LABSPEC, PHURINE, GLUCOSEU, HGBUR, BILIRUBINUR, KETONESUR, PROTEINUR, UROBILINOGEN, NITRITE, LEUKOCYTESUR,  in the last 72 hours Misc. Labs:  Imaging results:  Dg Chest 2 View  05/26/2014   CLINICAL DATA:  Dry cough and shortness of breath. Left-sided chest pain.  EXAM: CHEST  2 VIEW  COMPARISON:  05/15/2014.  FINDINGS: Trachea is midline. Heart size stable. Left subclavian pacemaker lead tips are in the right atrium and right ventricle. There is new basilar predominant interstitial prominence and indistinctness with peripheral septal lines. No focal airspace consolidation. Tiny bilateral pleural effusions.  IMPRESSION: 1. Mild congestive heart failure. 2. Previously seen nodular density in the right infrahilar region is poorly appreciated.   Electronically Signed   By: Lorin Picket M.D.   On: 05/26/2014 12:30    Other results:  Outside records:  Cardiac MRI: LVEDV 233 cc, LVESV 188 cc, LVSV 45cc, LVEF 19%.  Apical trabeculation consistent with noncompaction. RV size and systolic  function normal. Bilateral small pleural effusions, right greater than left.   (01/03/2009) Dobutamine MRI: LVEDV 228 cc, LVESV 191 cc, LVSV 37 cc,  LVEF 16%, severe global hypokinesis with akinetic segments corresponding to  left anterior coronary distribution. There are features consistent with  apical noncompaction and a small pericardial effusion.   (11/06/2010) 2D/3D echocardiogram with Doppler: LVID 5.8 cm, global  hypokinesis with prominent apical trabeculation, global ejection fraction  20-24%, mitral inflow pattern would indicate normal left atrial  pressure,  insufficient TR to assess PA systolic pressure, right atrial pressure is 5.      Assessment & Plan by Problem: Principal Problem:   SOB Active Problems:   DIABETES MELLITUS, TYPE II   TOBACCO ABUSE   Drug abuse and dependence   CHF (congestive heart failure)   Long term (current) use of anticoagulants   Dyspnea  CHF execerbation - likely 2/2 to cocaine abuse  has cough, orthopnea, SOB, volume is up 5 lb (from 153). - last ECHO on 10/2010 showed EF 20-24%, global hypokinesis.  - UDS positive for cocaine - will repeat ECHO tomorrow - EKG and repeat tomorrow, cycle trops - I/o, daily weights, CBC, BMP - diuresed -2850 so far from IV lasix 60mg   in ED. Appears dry now - will do lasix PO 20mg  daily - INR subtherapeutic - needs to f/up with PCP at [facility] immediately after d/c. -coreg 3.125 BID, losartan 25mg  daily, spironolactone 25mg  daily,   - D/ced home lisinopril (given cough and also since on losartan)  HTN - coreg 3.125 BID, losartan 25mg  daily, spironolactone 25mg  daily  Hyperlipiedemia - cont lipitor 40 mg daily  DM II - cont home lantus 20 QHS + SSI sensitive  Anticoguatlion - for possible afib?  - not therapeutic currently. Coumadin per pharmacy - also on 325mg  ASA at home. Will do 81mg  asa daily, concern for risk of bleed with coumadin. -needs f/up outpatient for INR checks.  Cocaine Abuse -discussed cocaine cessation  Tobacco abuse  -discussed cessation  Dispo: Disposition is deferred at this time, awaiting improvement of current medical problems. Anticipated discharge in approximately 2-3 day(s).   The patient does have a current PCP (Minda Ditto, MD) and does need an [facility] hospital follow-up appointment after discharge.  The patient does not know have transportation limitations that hinder transportation to clinic appointments.  Signed: Dellia Nims, MD 05/26/2014, 7:19 PM

## 2014-05-26 NOTE — Progress Notes (Signed)
ANTICOAGULATION CONSULT NOTE - Initial Consult  Pharmacy Consult for Coumadin Indication: atrial fibrillation  No Known Allergies  Patient Measurements: Height: 6\' 1"  (185.4 cm) Weight: 158 lb 8 oz (71.895 kg) IBW/kg (Calculated) : 79.9  Vital Signs: Temp: 98.6 F (37 C) (08/14 1600) Temp src: Oral (08/14 1600) BP: 103/89 mmHg (08/14 1600) Pulse Rate: 70 (08/14 1600)  Labs:  Recent Labs  05/26/14 1138  HGB 11.1*  HCT 34.9*  PLT 261  LABPROT 17.9*  INR 1.48  CREATININE 0.83  TROPONINI <0.30    Estimated Creatinine Clearance: 105.9 ml/min (by C-G formula based on Cr of 0.83).   Medical History: Past Medical History  Diagnosis Date  . CHF (congestive heart failure)   . Drug abuse and dependence   . Automatic implantable cardioverter-defibrillator in situ   . Heart murmur   . Sleep apnea     "cleared after T&A" (08/09/2013)  . Exertional shortness of breath   . Type I diabetes mellitus     Assessment: 53 year old male admitted with CHF exacerbation after cocaine abuse.  On Coumadin PTA for Afib.  INR currently sub-therapeutic on reported home dose of 7 mg daily. Has not taken Coumadin dose today  Goal of Therapy:  INR 2-3 Monitor platelets by anticoagulation protocol: Yes   Plan:  1) Coumadin 10 mg po x 1 dose at 1800 pm 2) Daily INR  Thank you. Anette Guarneri, PharmD [phone]  05/26/2014,7:58 PM

## 2014-05-26 NOTE — Progress Notes (Signed)
1600-1900 shift. Pt.is A/Ox4 and is ambulatory with 1 person standby assist.He had no c/o pain and no signs of distress. Paged MD with Internal Medicine to notify that patient had arrived to the unit and needed additional orders. New orders were placed.

## 2014-05-26 NOTE — ED Notes (Signed)
Patient returned from X-ray 

## 2014-05-26 NOTE — ED Notes (Signed)
Patient transported to X-ray 

## 2014-05-26 NOTE — ED Notes (Signed)
Attempted report 

## 2014-05-27 ENCOUNTER — Observation Stay (HOSPITAL_COMMUNITY): Payer: Medicare Other

## 2014-05-27 ENCOUNTER — Encounter (HOSPITAL_COMMUNITY): Payer: Self-pay | Admitting: Internal Medicine

## 2014-05-27 DIAGNOSIS — I428 Other cardiomyopathies: Secondary | ICD-10-CM | POA: Diagnosis present

## 2014-05-27 DIAGNOSIS — I059 Rheumatic mitral valve disease, unspecified: Secondary | ICD-10-CM

## 2014-05-27 LAB — CBC
HCT: 36 % — ABNORMAL LOW (ref 39.0–52.0)
Hemoglobin: 11.6 g/dL — ABNORMAL LOW (ref 13.0–17.0)
MCH: 31.4 pg (ref 26.0–34.0)
MCHC: 32.2 g/dL (ref 30.0–36.0)
MCV: 97.3 fL (ref 78.0–100.0)
Platelets: 276 10*3/uL (ref 150–400)
RBC: 3.7 MIL/uL — ABNORMAL LOW (ref 4.22–5.81)
RDW: 13.1 % (ref 11.5–15.5)
WBC: 8.1 10*3/uL (ref 4.0–10.5)

## 2014-05-27 LAB — GLUCOSE, CAPILLARY
Glucose-Capillary: 312 mg/dL — ABNORMAL HIGH (ref 70–99)
Glucose-Capillary: 147 mg/dL — ABNORMAL HIGH (ref 70–99)
Glucose-Capillary: 406 mg/dL — ABNORMAL HIGH (ref 70–99)
Glucose-Capillary: 199 mg/dL — ABNORMAL HIGH (ref 70–99)
Glucose-Capillary: 126 mg/dL — ABNORMAL HIGH (ref 70–99)

## 2014-05-27 LAB — TROPONIN I: Troponin I: <0.3 ng/mL (ref <0.30)

## 2014-05-27 LAB — BASIC METABOLIC PANEL
Anion gap: 9 (ref 5–15)
BUN: 13 mg/dL (ref 6–23)
CO2: 30 mEq/L (ref 19–32)
Calcium: 8.7 mg/dL (ref 8.4–10.5)
Chloride: 98 mEq/L (ref 96–112)
Creatinine, Ser: 0.92 mg/dL (ref 0.50–1.35)
GFR calc Af Amer: >90 mL/min (ref >90)
GFR calc non Af Amer: >90 mL/min (ref >90)
Glucose, Bld: 414 mg/dL — ABNORMAL HIGH (ref 70–99)
Potassium: 4.3 mEq/L (ref 3.7–5.3)
Sodium: 137 mEq/L (ref 137–147)

## 2014-05-27 LAB — PROTIME-INR
INR: 1.39 (ref 0.00–1.49)
Prothrombin Time: 17.1 seconds — ABNORMAL HIGH (ref 11.6–15.2)

## 2014-05-27 LAB — HEMOGLOBIN A1C
Hgb A1c MFr Bld: 11 % — ABNORMAL HIGH (ref <5.7)
Mean Plasma Glucose: 269 mg/dL — ABNORMAL HIGH (ref <117)

## 2014-05-27 IMAGING — CR DG CHEST 2V
2 series · 2 of 2 positions shown · non-contrast
Comparison: [DATE].

CLINICAL DATA: Dry cough.

EXAM:
CHEST  2 VIEW

[w chest pa]
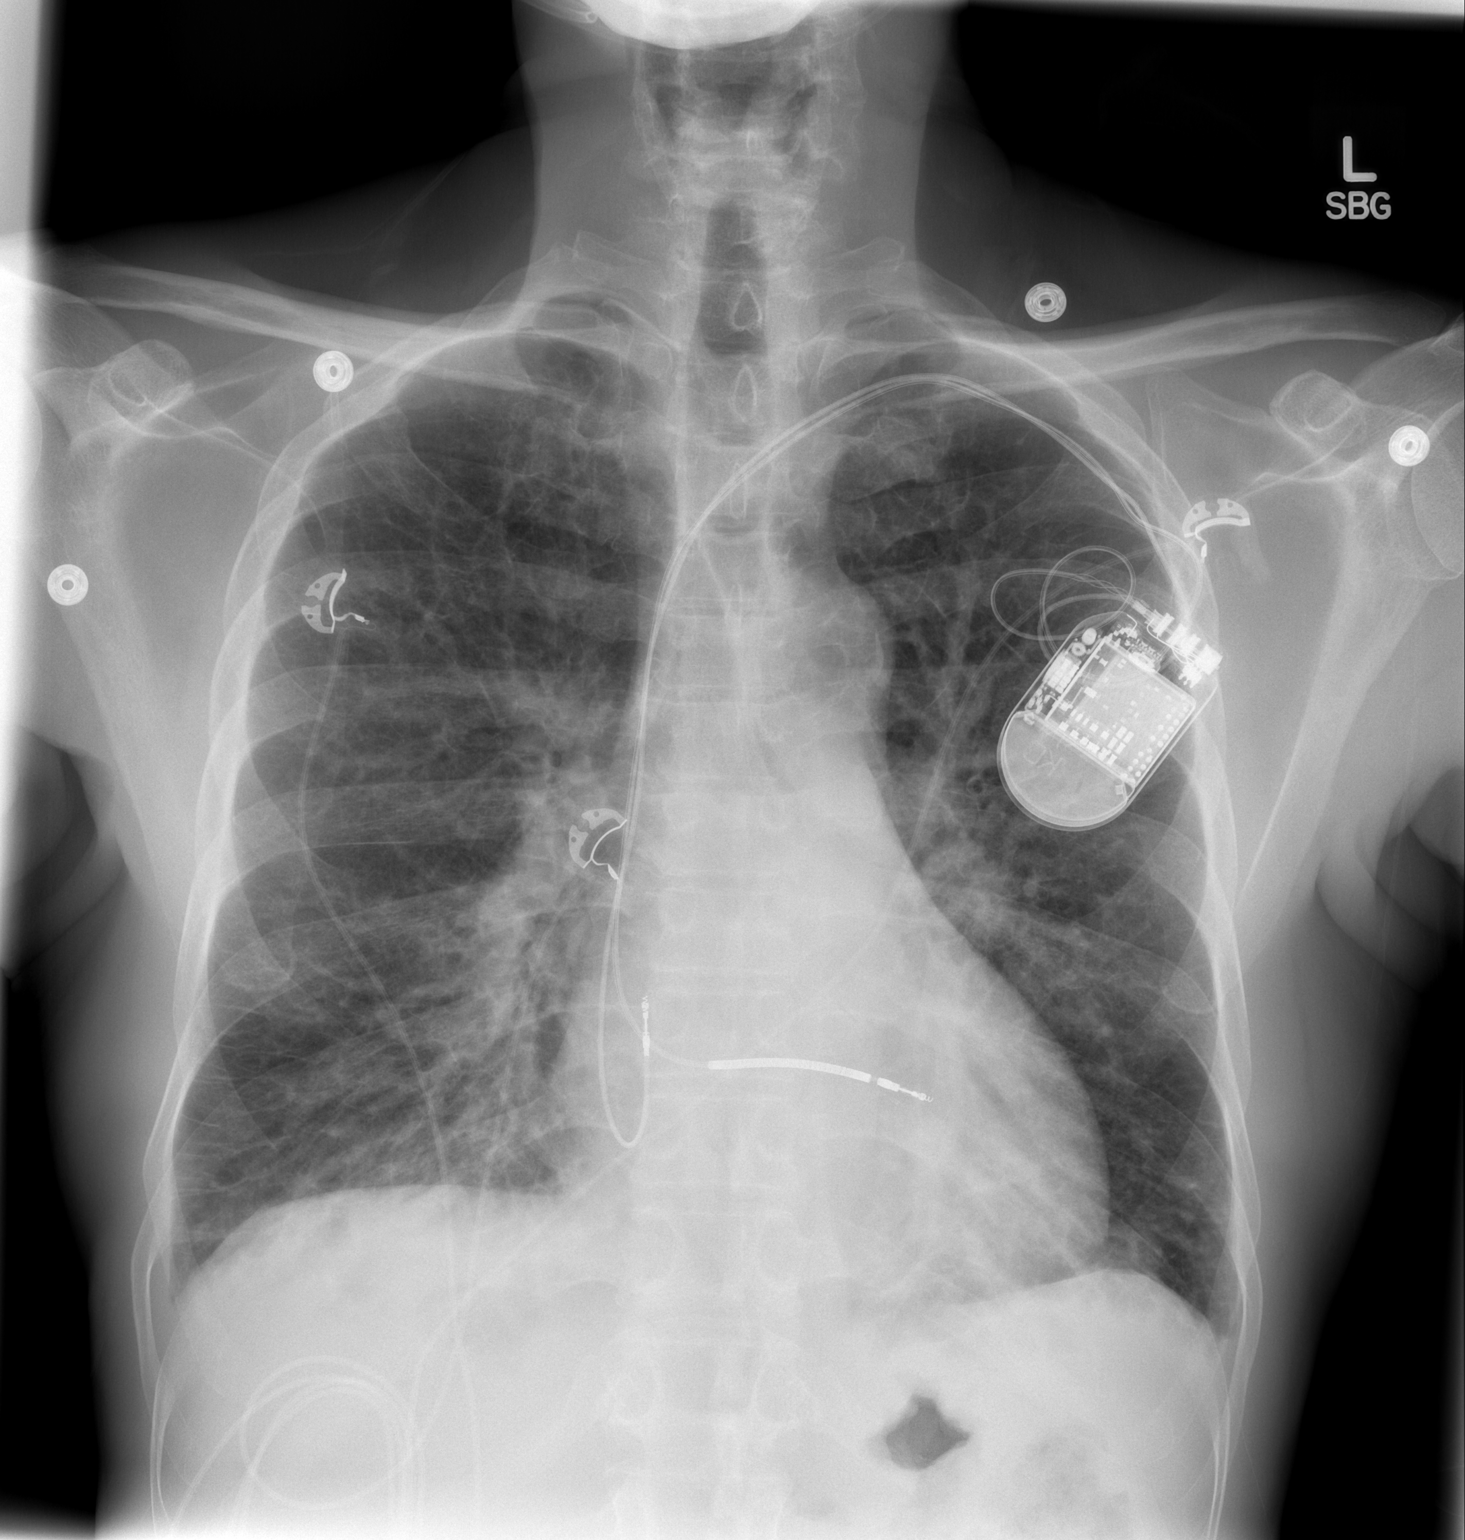

[w chest lat]
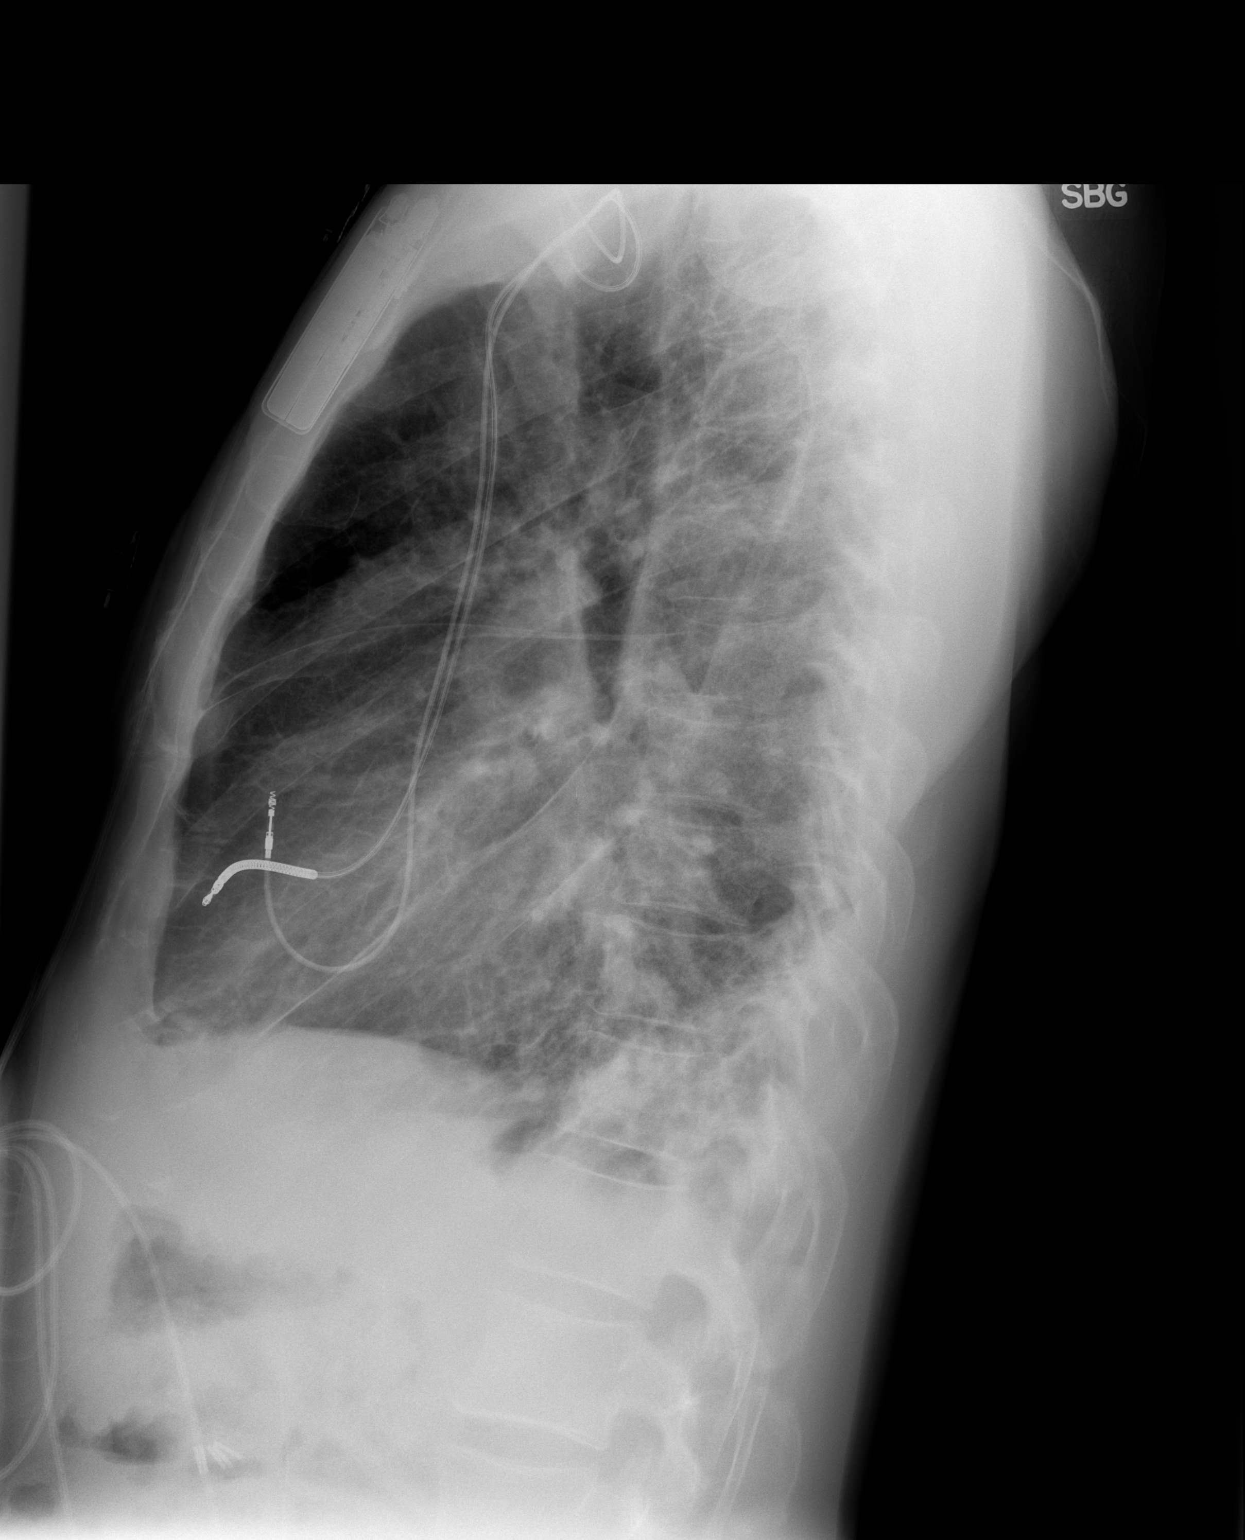

[2 of 2 positions shown; findings below may reference images not displayed]

FINDINGS: Cardiac silhouette is normal in size. No mediastinal or hilar
masses. Stable left anterior chest wall sequential pacemaker.

Lungs are hyperexpanded. There is central vascular prominence and
bilateral interstitial thickening most evident in the lung bases.
There is more confluent lung base type opacity on the lateral view
posteriorly. This is stable from the previous day's study no pleural
effusion. No pneumothorax.

Bony thorax is intact.
IMPRESSION: 1. Diffuse interstitial thickening with more confluent lung base
opacity. This is stable from the previous day's study. There is
interstitial thickening has developed since a chest radiograph dated
[DATE]. As noted previously, this may reflect interstitial
edema. Diffuse interstitial infection or inflammation is possible.

## 2014-05-27 MED ORDER — FUROSEMIDE 20 MG PO TABS
20.0000 mg | ORAL_TABLET | Freq: Once | ORAL | Status: AC
  Administered 2014-05-27: 20 mg via ORAL
  Filled 2014-05-27: qty 1

## 2014-05-27 MED ORDER — FUROSEMIDE 40 MG PO TABS
40.0000 mg | ORAL_TABLET | Freq: Every day | ORAL | Status: DC
  Administered 2014-05-27: 40 mg via ORAL
  Filled 2014-05-27 (×2): qty 1

## 2014-05-27 MED ORDER — FUROSEMIDE 10 MG/ML IJ SOLN: 60.0000 mg | Freq: Once | INTRAMUSCULAR | Status: DC

## 2014-05-27 MED ORDER — WARFARIN SODIUM 2.5 MG PO TABS
12.5000 mg | ORAL_TABLET | Freq: Once | ORAL | Status: DC
  Filled 2014-05-27: qty 1

## 2014-05-27 MED ORDER — INSULIN GLARGINE 100 UNIT/ML ~~LOC~~ SOLN
32.0000 [IU] | Freq: Every day | SUBCUTANEOUS | Status: DC
  Administered 2014-05-28 – 2014-05-29 (×2): 32 [IU] via SUBCUTANEOUS
  Filled 2014-05-27 (×4): qty 0.32

## 2014-05-27 MED ORDER — CARVEDILOL 6.25 MG PO TABS
6.2500 mg | ORAL_TABLET | Freq: Two times a day (BID) | ORAL | Status: DC
  Administered 2014-05-27 – 2014-05-29 (×4): 6.25 mg via ORAL
  Filled 2014-05-27 (×6): qty 1

## 2014-05-27 MED ORDER — GLUCERNA SHAKE PO LIQD
237.0000 mL | Freq: Two times a day (BID) | ORAL | Status: DC
  Administered 2014-05-27: 237 mL via ORAL

## 2014-05-27 MED ORDER — COUMADIN BOOK
Freq: Once | Status: AC
Start: 2014-05-27 — End: 2014-05-27
  Administered 2014-05-27: 15:00:00
  Filled 2014-05-27: qty 1

## 2014-05-27 MED ORDER — WARFARIN SODIUM 7.5 MG PO TABS
15.0000 mg | ORAL_TABLET | Freq: Once | ORAL | Status: AC
  Administered 2014-05-27: 15 mg via ORAL
  Filled 2014-05-27: qty 2

## 2014-05-27 MED ORDER — SPIRONOLACTONE 12.5 MG HALF TABLET
12.5000 mg | ORAL_TABLET | Freq: Every day | ORAL | Status: DC
  Administered 2014-05-27 – 2014-06-01 (×5): 12.5 mg via ORAL
  Filled 2014-05-27 (×6): qty 1

## 2014-05-27 MED ORDER — WARFARIN VIDEO
1.0000 | Freq: Once | Status: AC
  Administered 2014-05-28: 1

## 2014-05-27 NOTE — Progress Notes (Signed)
Subjective:  Feeling same as before, still sob, and coughing. Denies chest pain/fever/chills/n/v.   Objective: Vital signs in last 24 hours: Filed Vitals:   05/26/14 1500 05/26/14 1600 05/26/14 2230 05/27/14 0426  BP: 109/82 103/89 99/74 98/75   Pulse: 103 70 108 105  Temp:  98.6 F (37 C) 98.7 F (37.1 C) 97.6 F (36.4 C)  TempSrc:  Oral Oral Oral  Resp: 20 18 20 20   Height:  6\' 1"  (1.854 m)    Weight:  71.895 kg (158 lb 8 oz)  71.6 kg (157 lb 13.6 oz)  SpO2: 96% 100% 98% 95%   Weight change:   Intake/Output Summary (Last 24 hours) at 05/27/14 0917 Last data filed at 05/27/14 0600  Gross per 24 hour  Intake    360 ml  Output   3300 ml  Net  -2940 ml   Vitals reviewed. General: resting in bed, NAD HEENT: PERRL, EOMI, no scleral icterus Cardiac: RRR, +gallop. No murmurs. Pulm: clear to auscultation bilaterally, no wheezes, rales, or rhonchi Abd: soft, nontender, nondistended, BS present Ext: warm and well perfused, no pedal edema Neuro: alert and oriented X3, cranial nerves II-XII grossly intact, strength and sensation to light touch equal in bilateral upper and lower extremities  Lab Results: Basic Metabolic Panel:  Recent Labs Lab 05/26/14 1138 05/27/14 0142  NA 141 137  K 4.1 4.3  CL 105 98  CO2 25 30  GLUCOSE 232* 414*  BUN 8 13  CREATININE 0.83 0.92  CALCIUM 8.8 8.7   Liver Function Tests: No results found for this basename: AST, ALT, ALKPHOS, BILITOT, PROT, ALBUMIN,  in the last 168 hours No results found for this basename: LIPASE, AMYLASE,  in the last 168 hours No results found for this basename: AMMONIA,  in the last 168 hours CBC:  Recent Labs Lab 05/26/14 1138 05/27/14 0142  WBC 9.2 8.1  HGB 11.1* 11.6*  HCT 34.9* 36.0*  MCV 99.7 97.3  PLT 261 276   Cardiac Enzymes:  Recent Labs Lab 05/26/14 1138 05/26/14 2017 05/27/14 0142  TROPONINI <0.30 <0.30 <0.30   BNP:  Recent Labs Lab 05/26/14 1138  PROBNP 1034.0*    D-Dimer:  Recent Labs Lab 05/26/14 1138  DDIMER <0.27   CBG:  Recent Labs Lab 05/26/14 2216 05/27/14 0549  GLUCAP 323* 312*   Hemoglobin A1C:  Recent Labs Lab 05/26/14 2017  HGBA1C 11.0*   Fasting Lipid Panel: No results found for this basename: CHOL, HDL, LDLCALC, TRIG, CHOLHDL, LDLDIRECT,  in the last 168 hours Thyroid Function Tests:  Recent Labs Lab 05/26/14 2017  TSH 0.934   Coagulation:  Recent Labs Lab 05/26/14 1138 05/27/14 0142  LABPROT 17.9* 17.1*  INR 1.48 1.39   Anemia Panel: No results found for this basename: VITAMINB12, FOLATE, FERRITIN, TIBC, IRON, RETICCTPCT,  in the last 168 hours Urine Drug Screen: Drugs of Abuse     Component Value Date/Time   LABOPIA NONE DETECTED 05/26/2014 1423   COCAINSCRNUR POSITIVE* 05/26/2014 1423   LABBENZ NONE DETECTED 05/26/2014 1423   AMPHETMU NONE DETECTED 05/26/2014 1423   THCU NONE DETECTED 05/26/2014 1423   LABBARB NONE DETECTED 05/26/2014 1423    Alcohol Level: No results found for this basename: ETH,  in the last 168 hours Urinalysis: No results found for this basename: COLORURINE, APPERANCEUR, LABSPEC, PHURINE, GLUCOSEU, HGBUR, BILIRUBINUR, KETONESUR, PROTEINUR, UROBILINOGEN, NITRITE, LEUKOCYTESUR,  in the last 168 hours Misc. Labs:   Micro Results: No results found for this or any previous visit (  from the past 240 hour(s)). Studies/Results: Dg Chest 2 View  05/26/2014   CLINICAL DATA:  Dry cough and shortness of breath. Left-sided chest pain.  EXAM: CHEST  2 VIEW  COMPARISON:  05/15/2014.  FINDINGS: Trachea is midline. Heart size stable. Left subclavian pacemaker lead tips are in the right atrium and right ventricle. There is new basilar predominant interstitial prominence and indistinctness with peripheral septal lines. No focal airspace consolidation. Tiny bilateral pleural effusions.  IMPRESSION: 1. Mild congestive heart failure. 2. Previously seen nodular density in the right infrahilar region is  poorly appreciated.   Electronically Signed   By: Lorin Picket M.D.   On: 05/26/2014 12:30   Medications: I have reviewed the patient's current medications. Scheduled Meds: . aspirin      . aspirin EC  81 mg Oral Daily  . atorvastatin  40 mg Oral q1800  . carvedilol  3.125 mg Oral BID WC  . furosemide  20 mg Oral Daily  . insulin aspart  0-9 Units Subcutaneous TID WC  . insulin glargine  20 Units Subcutaneous QHS  . losartan  25 mg Oral Daily  . sodium chloride  3 mL Intravenous Q12H  . sodium chloride  3 mL Intravenous Q12H  . warfarin  10 mg Oral ONCE-1800  . Warfarin - Pharmacist Dosing Inpatient   Does not apply q1800   Continuous Infusions:  PRN Meds:.sodium chloride, acetaminophen, acetaminophen, sodium chloride Assessment/Plan: Principal Problem:   SOB Active Problems:   DIABETES MELLITUS, TYPE II   TOBACCO ABUSE   Drug abuse and dependence   CHF (congestive heart failure)   Long term (current) use of anticoagulants   Dyspnea  CHF execerbation - likely 2/2 to cocaine abuse  Hx of left ventricular non-compaction has cough, orthopnea, SOB, volume is up 5 lb (from 153).  - last ECHO on 10/2010 showed EF 20-24%, global hypokinesis. Has hx of left ventricular non compaction.  - UDS positive for cocaine  - ECHO 05/27/14: EF 20-25%, diffuse hypokinesis, akinesis of entireapical myocardium, akinesis of entireanterior myocardium. - trops negative. EKG shows no ischemia - I/o, daily weights, CBC, BMP  - diuresed -2850 so far from IV lasix 60mg  in ED. Appears dry now - will do lasix PO 20mg  daily on going (was supposed to take lasix PO PRN).  - INR subtherapeutic - needs to f/up with PCP at [facility] immediately after d/c immediately for INR f/up. - also needs cardiology f/up. Patient wants to transfer care to [facility] but he must arrange this with his current medical team at [facility].  -coreg 6.250 BID, losartan 25mg  daily, spironolactone 12.5mg  daily. - D/ced home  lisinopril (given cough and also since on losartan)   HTN  - coreg 6.250 BID, losartan 25mg  daily, spironolactone 12.5mg  daily (restarted)  Hyperlipiedemia  - cont lipitor 40 mg daily   DM II  - was on 20 lantus here. CBG has been high 300-400's. Will switch to home 32 QHS Lantus +ssi. At home takes 32 QHS lantus.  Left ventricular non-compaction - on Anticoguatlion - not therapeutic currently. Was taking 7mg  coumadin daily. Coumadin per pharmacy  - also on 325mg  ASA at home. Will do 81mg  asa daily, concern for risk of bleed with coumadin.  -needs f/up outpatient for INR checks.  - has defib placed for LV noncompaction disease.  Cocaine Abuse  -discussed cocaine cessation   Tobacco abuse  -discussed cessation   Dispo: Disposition is deferred at this time, awaiting improvement of current medical  problems.  Anticipated discharge in approximately 2-3 day(s).   The patient does have a current PCP (Minda Ditto, MD) and does need an [facility] hospital follow-up appointment after discharge.  The patient does not know have transportation limitations that hinder transportation to clinic appointments.  .Services Needed at time of discharge: Y = Yes, Blank = No PT:   OT:   RN:   Equipment:   Other:     LOS: 1 day   Dellia Nims, MD 05/27/2014, 9:17 AM

## 2014-05-27 NOTE — Progress Notes (Signed)
INTERNAL MEDICINE TEACHING SERVICE Night Float Progress Note   Subjective:    We were called overnight by the RN for evaluation of chest pain. At time of evaluation, pt stated that he had had a 5 second episode of sharp chest pain located to the left of his sternum. The pain did not radiate and was not associated with an increase in shortness of breath. No N/V. Chest pain resolved on its own and was similar to episodes he had had yesterday when he was admitted.    Objective:    BP 93/62  Pulse 100  Temp(Src) 97.8 F (36.6 C) (Oral)  Resp 20  Ht 6\' 1"  (1.854 m)  Wt 71.6 kg (157 lb 13.6 oz)  BMI 20.83 kg/m2  SpO2 100%   Labs: Basic Metabolic Panel:    Component Value Date/Time   NA 137 05/27/2014 0142   K 4.3 05/27/2014 0142   CL 98 05/27/2014 0142   CO2 30 05/27/2014 0142   BUN 13 05/27/2014 0142   CREATININE 0.92 05/27/2014 0142   GLUCOSE 414* 05/27/2014 0142   CALCIUM 8.7 05/27/2014 0142    CBC:    Component Value Date/Time   WBC 8.1 05/27/2014 0142   HGB 11.6* 05/27/2014 0142   HCT 36.0* 05/27/2014 0142   PLT 276 05/27/2014 0142   MCV 97.3 05/27/2014 0142   NEUTROABS 10.9* 05/15/2014 0805   LYMPHSABS 1.9 05/15/2014 0805   MONOABS 1.2* 05/15/2014 0805   EOSABS 0.1 05/15/2014 0805   BASOSABS 0.0 05/15/2014 0805    Cardiac Enzymes: Lab Results  Component Value Date   CKTOTAL 199 06/25/2011   CKMB 1.9 06/25/2011   TROPONINI <0.30 05/27/2014    Physical Exam: General: Vital signs reviewed and noted. Well-developed, well-nourished, in no acute distress; alert, appropriate and cooperative throughout examination. Patient was sitting on the side of the bed eating lasagna.  Lungs:  Normal respiratory effort. Clear to auscultation BL without crackles or wheezes.  Heart: RRR. S1 and S2 normal without gallop, murmur, or rubs. Chest was mildly tender to palpation over the left mid-clavicular chest wall.   Abdomen:  BS normoactive. Soft, Nondistended, non-tender.  No masses or organomegaly.   Extremities: No pretibial edema.     Assessment/ Plan:    Chest Pain: Most likely 2/2 to CHF exacerbation or musculoskeletal in nature. Doubt ACS. Chest pain had completely resolved after 5 seconds. Ordered EKG. Trops were negative x 3 yesterday.    Osa Craver, DO PGY-1 Internal Medicine Resident Pager # [phone] 05/27/2014 5:50 PM

## 2014-05-27 NOTE — Progress Notes (Signed)
At dinner time patient c/o chest pain that last for few seconds, v/s stable, pt. Still have SOB but does not associated with CP. MD notified, 12 lead EKG done per order, also pt. CBG was 406 before dinner, 9 units of insulin given per SSI order, MD aware glucerna shake d/c. Re check cbg is 199. Will continue to monitor patient.

## 2014-05-27 NOTE — H&P (Addendum)
Internal Medicine On-Call Attending Admission Note Date: 05/27/2014  Patient name: Dennis [name] Medical record number: [id number] Date of birth: 1961/03/19 Age: 53 y.o. Gender: male  I saw and evaluated the patient. I reviewed the resident's note and I agree with the resident's findings and plan as documented in the resident's note, with the following additional comments.  Chief Complaint(s): Shortness of breath, cough  History - key components related to admission: Patient is a 53 year old man with history of ischemic cardiomyopathy managed at [facility], S/P ICD, hypertension, type 2 diabetes mellitus, cocaine abuse, and other problems as outlined in the medical history admitted with complaint of persisting cough for the past 3 weeks with worsening shortness of breath for 3 days prior to admission.  He reports occasional transient episodes of chest pain about 2 days ago, described as sharp and lasting only a second or two; he denies any persisting chest pain.  He is on warfarin due to a history of left ventricular compaction, but says it has been several months since he was seen in the anticoagulation clinic at [facility].  He reports that he occasionally uses cocaine, and last snorted cocaine about 5 days prior to admission.  He denies any lower extremity edema.   Physical Exam - key components related to admission:  Filed Vitals:   05/26/14 1600 05/26/14 2230 05/27/14 0426 05/27/14 1002  BP: 103/89 99/74 98/75  98/71  Pulse: 70 108 105 101  Temp: 98.6 F (37 C) 98.7 F (37.1 C) 97.6 F (36.4 C) 98.6 F (37 C)  TempSrc: Oral Oral Oral Oral  Resp: 18 20 20 20   Height: 6\' 1"  (1.854 m)     Weight: 158 lb 8 oz (71.895 kg)  157 lb 13.6 oz (71.6 kg)   SpO2: 100% 98% 95% 97%    General: Alert, no distress Lungs: Bibasilar crackles one third way up Heart: S1-S2; prominent S3; no murmurs Abdomen: Bowel sounds present, soft, nontender; no hepatosplenomegaly Extremities:  No edema  Lab results:   Basic Metabolic Panel:  Recent Labs  05/26/14 1138 05/27/14 0142  NA 141 137  K 4.1 4.3  CL 105 98  CO2 25 30  GLUCOSE 232* 414*  BUN 8 13  CREATININE 0.83 0.92  CALCIUM 8.8 8.7     CBC:  Recent Labs  05/26/14 1138 05/27/14 0142  WBC 9.2 8.1  HGB 11.1* 11.6*  HCT 34.9* 36.0*  MCV 99.7 97.3  PLT 261 276    Cardiac Enzymes:  Recent Labs  05/26/14 1138 05/26/14 2017 05/27/14 0142  TROPONINI <0.30 <0.30 <0.30    BNP:  Recent Labs  05/26/14 1138  PROBNP 1034.0*    D-Dimer:  Recent Labs  05/26/14 1138  DDIMER <0.27    CBG:  Recent Labs  05/26/14 2216 05/27/14 0549  GLUCAP 323* 312*    Hemoglobin A1C:  Recent Labs  05/26/14 2017  HGBA1C 11.0*    Thyroid Function Tests:  Recent Labs  05/26/14 2017  TSH 0.934     Coagulation:  Recent Labs  05/26/14 1138 05/27/14 0142  INR 1.48 1.39    Urine Drug Screen: Drugs of Abuse     Component Value Date/Time   LABOPIA NONE DETECTED 05/26/2014 1423   COCAINSCRNUR POSITIVE* 05/26/2014 1423   LABBENZ NONE DETECTED 05/26/2014 1423   AMPHETMU NONE DETECTED 05/26/2014 1423   THCU NONE DETECTED 05/26/2014 1423   LABBARB NONE DETECTED 05/26/2014 1423      Urinalysis    Component Value Date/Time  COLORURINE YELLOW 09/12/2012 2023   APPEARANCEUR CLEAR 09/12/2012 2023   LABSPEC 1.043* 09/12/2012 2023   PHURINE 5.5 09/12/2012 2023   GLUCOSEU >1000* 09/12/2012 2023   HGBUR NEGATIVE 09/12/2012 2023   BILIRUBINUR NEGATIVE 09/12/2012 2023   KETONESUR NEGATIVE 09/12/2012 2023   PROTEINUR NEGATIVE 09/12/2012 2023   UROBILINOGEN 0.2 09/12/2012 2023   NITRITE NEGATIVE 09/12/2012 2023   LEUKOCYTESUR NEGATIVE 09/12/2012 2023     Imaging results:  Dg Chest 2 View  05/26/2014   CLINICAL DATA:  Dry cough and shortness of breath. Left-sided chest pain.  EXAM: CHEST  2 VIEW  COMPARISON:  05/15/2014.  FINDINGS: Trachea is midline. Heart size stable. Left subclavian pacemaker  lead tips are in the right atrium and right ventricle. There is new basilar predominant interstitial prominence and indistinctness with peripheral septal lines. No focal airspace consolidation. Tiny bilateral pleural effusions.  IMPRESSION: 1. Mild congestive heart failure. 2. Previously seen nodular density in the right infrahilar region is poorly appreciated.   Electronically Signed   By: Lorin Picket M.D.   On: 05/26/2014 12:30    Other results: EKG 8/15: Sinus tachycardia;anterior infarct , age undetermined EKG 8/14: Sinus tachycardia; left atrial enlargement; borderline left axis deviation; anterior infarct, old; nonspecific T abnormalities, lateral leads   Assessment & Plan by Problem:  1. CHF exacerbation.  Patient has history of ischemic cardiomyopathy followed at [facility]; he has been on spironolactone, but has apparently not been on a loop diuretic.  He reports transient episodes of chest pain 2 days ago which do not sound anginal.  There is no evidence of acute MI by enzymes or EKG.  The CHF exacerbation may have been precipitated by cocaine use.  He responded well to initial diuresis, with a 2.9 L net output overnight.  Plan is continue diuresis with Lasix, transition to oral regimen when improved; follow ins/outs; 2-D echocardiogram; monitor; follow electrolytes and renal function; continue carvedilol, losartan, aspirin, warfarin, spironolactone.  Of note, patient's cardiologist switched him from lisinopril to losartan last fall, patient reports that he has continued taking lisinopril; this may be contributing to his cough.   2.  Cough.  Patient reports that persisting cough for about 3 weeks prior to admission.  This may represent symptoms of developing CHF.  Would repeat chest x-ray to document clearing of apparent pulmonary edema.  3.  Type 2 diabetes mellitus.  Patient has poorly controlled diabetes mellitus, and reports that he was recently advised by his  physician at [facility] to take Lantus insulin only.  Plan is use Lantus plus sliding scale insulin the acute setting, adjust as indicated by CBGs.  4.  Anticoagulation.  Patient is on anticoagulation apparently due to a history of left ventricular noncompaction.  His INR is subtherapeutic.  He reports that he has been taking warfarin 7 mg daily.  He has not followed up recently in the anticoagulation clinic at [place] is continue warfarin with adjusting by pharmacy.  5.  Cocaine abuse.  I emphasized the importance of abstinence to patient, and discussed with him the risks of cocaine especially in the setting of his ischemic heart disease.  6.  Tobacco abuse.  Counsel/assist with cessation.  7.  Other problems and plans as per the resident physician's note.

## 2014-05-27 NOTE — Progress Notes (Signed)
INITIAL NUTRITION ASSESSMENT  DOCUMENTATION CODES Per approved criteria  -Not Applicable   INTERVENTION: Glucerna Shake po BID, each supplement provides 220 kcal and 10 grams of protein  NUTRITION DIAGNOSIS: Inadequate oral intake related to shortness of breath and couth as evidenced by reported weight loss.   Goal: Pt to meet >/= 90% of their estimated nutrition needs   Monitor:  Weight trends, po intake, acceptance of supplements, labs  Reason for Assessment: MST  53 y.o. male  Admitting Dx: Shortness of breath  ASSESSMENT: 53 year old man with history of ischemic cardiomyopathy managed at [facility], S/P ICD, hypertension, type 2 diabetes mellitus, cocaine abuse, and other problems as outlined in the medical history admitted with complaint of persisting cough for the past 3 weeks with worsening shortness of breath for 3 days prior to admission.  -Pt reports recent weight loss of unknown amount. He says that his appetite has not been good prior to admission. Appetite is improved in hospital with meal completion recorded as 100%.  - Pt with no signs of significant fat or muscle wasting at this time.   Labs: HbA1C 11.0% CBGs: 106-323 Na and K WNL  Height: Ht Readings from Last 1 Encounters:  05/26/14 6\' 1"  (1.854 m)    Weight: Wt Readings from Last 1 Encounters:  05/27/14 157 lb 13.6 oz (71.6 kg)    Ideal Body Weight: 79.9 kg  % Ideal Body Weight: 85%  Wt Readings from Last 10 Encounters:  05/27/14 157 lb 13.6 oz (71.6 kg)  08/10/13 153 lb 3.2 oz (69.491 kg)  09/15/12 169 lb (76.658 kg)  09/14/12 169 lb (76.658 kg)  09/12/12 175 lb (79.379 kg)  08/07/12 165 lb (74.844 kg)  10/12/07 164 lb 2.1 oz (74.449 kg)  09/13/07 164 lb 2.1 oz (74.449 kg)    Usual Body Weight: unknown  % Usual Body Weight: n/a  BMI:  Body mass index is 20.83 kg/(m^2).  Estimated Nutritional Needs: Kcal: 1900-2100 Protein: 95-105g Fluid: 2.1  L/day  Skin: Intact  Diet Order:    EDUCATION NEEDS: -Education needs addressed   Intake/Output Summary (Last 24 hours) at 05/27/14 1115 Last data filed at 05/27/14 1003  Gross per 24 hour  Intake    600 ml  Output   3700 ml  Net  -3100 ml    Last BM: prior to admission   Labs:   Recent Labs Lab 05/26/14 1138 05/27/14 0142  NA 141 137  K 4.1 4.3  CL 105 98  CO2 25 30  BUN 8 13  CREATININE 0.83 0.92  CALCIUM 8.8 8.7  GLUCOSE 232* 414*    CBG (last 3)   Recent Labs  05/26/14 2216 05/27/14 0549  GLUCAP 323* 312*    Scheduled Meds: . aspirin EC  81 mg Oral Daily  . atorvastatin  40 mg Oral q1800  . carvedilol  3.125 mg Oral BID WC  . furosemide  20 mg Oral Once  . insulin aspart  0-9 Units Subcutaneous TID WC  . insulin glargine  20 Units Subcutaneous QHS  . losartan  25 mg Oral Daily  . sodium chloride  3 mL Intravenous Q12H  . sodium chloride  3 mL Intravenous Q12H  . warfarin  10 mg Oral ONCE-1800  . Warfarin - Pharmacist Dosing Inpatient   Does not apply q1800    Continuous Infusions:   Past Medical History  Diagnosis Date  . CHF (congestive heart failure)   . Drug abuse and dependence   .  Automatic implantable cardioverter-defibrillator in situ   . Heart murmur   . Exertional shortness of breath   . Type I diabetes mellitus   . Pneumonia ~ 1988  . Sleep apnea     "cleared after T&A"  . Cardiomyopathy, ischemic 07/21/2011  . BP (high blood pressure) 03/04/2013  . Left ventricular noncompaction 05/27/2014    Past Surgical History  Procedure Laterality Date  . Forearm fracture surgery Left 1980  . Cardiac defibrillator placement  03/2011  . Cholecystectomy  1994  . Tonsillectomy and adenoidectomy  ~ [place] RD, LDN

## 2014-05-27 NOTE — Progress Notes (Signed)
  Echocardiogram 2D Echocardiogram has been performed.  Dennis [name] 05/27/2014, 11:15 AM

## 2014-05-27 NOTE — Progress Notes (Signed)
Internal Medicine Attending  Date: 05/27/2014  Patient name: Dennis [name] Medical record number: [id number] Date of birth: [birth year] Age: 52 y.o. Gender: male  See attending H&P note

## 2014-05-27 NOTE — Progress Notes (Signed)
Utilization Review Completed.Dennis [name], Dennis [name]  

## 2014-05-27 NOTE — Progress Notes (Addendum)
PHARMACY NOTE  Pharmacy Consult :  53 y.o. male is currently on chronic Coumadin for history of CHF, ICM, L-ventricular compaction.  Dosing Wt :  71.6 kg  Hematology :  Recent Labs  05/26/14 1138 05/27/14 0142  HGB 11.1* 11.6*  HCT 34.9* 36.0*  PLT 261 276  LABPROT 17.9* 17.1*  INR 1.48 1.39  CREATININE 0.83 0.92    Current Medication[s] Include: Medication PTA: Prescriptions prior to admission  Medication Sig Dispense Refill  . aspirin 325 MG tablet Take 325 mg by mouth daily.      . carvedilol (COREG) 3.125 MG tablet Take 3.125 mg by mouth 2 (two) times daily with a meal.      . insulin glargine (LANTUS) 100 UNIT/ML injection Inject 32 Units into the skin at bedtime.      Marland Kitchen lisinopril (PRINIVIL,ZESTRIL) 2.5 MG tablet Take 2.5 mg by mouth daily.      Marland Kitchen losartan (COZAAR) 25 MG tablet Take 25 mg by mouth daily.      . QC PEN NEEDLES 31G X 6 MM MISC 1 each by Other route See admin instructions. Use daily with insulin.      . simvastatin (ZOCOR) 80 MG tablet Take 80 mg by mouth at bedtime.      Marland Kitchen spironolactone (ALDACTONE) 25 MG tablet Take 12.5 mg by mouth daily.       Marland Kitchen warfarin (COUMADIN) 2 MG tablet Take 2 mg by mouth daily. 2 mg in addition to 5 mg      . warfarin (COUMADIN) 5 MG tablet Take 5 mg by mouth daily. 5 mg in addition to 2 mg       Scheduled:  Scheduled:  . aspirin EC  81 mg Oral Daily  . atorvastatin  40 mg Oral q1800  . carvedilol  3.125 mg Oral BID WC  . furosemide  20 mg Oral Once  . insulin aspart  0-9 Units Subcutaneous TID WC  . insulin glargine  20 Units Subcutaneous QHS  . losartan  25 mg Oral Daily  . sodium chloride  3 mL Intravenous Q12H  . sodium chloride  3 mL Intravenous Q12H  . warfarin  10 mg Oral ONCE-1800  . Warfarin - Pharmacist Dosing Inpatient   Does not apply q1800    Assessment :  53 y/o male admitted with CHF exacerbation on chronic Coumadin for history of CHF, ICM, Left ventricular  compaction.  Coumadin dose for 10 mg not given last PM, computer defaulted dose to next day.  Today's INR down to 1.39.   INR is Subtherapeutic.    No evidence of bleeding complications observed.  Goal :  INR goal is 2-3  Plan : 1. Coumadin 15 mg po today. 2. Daily INR's, CBC. Monitor for bleeding complications.   Follow Platelet counts. 3. Coumadin video and booklet to begin discharge teaching.  Stramoski, Craig Guess,  Pharm.D  05/27/2014  11:18 AM

## 2014-05-28 DIAGNOSIS — I509 Heart failure, unspecified: Secondary | ICD-10-CM

## 2014-05-28 DIAGNOSIS — I428 Other cardiomyopathies: Secondary | ICD-10-CM

## 2014-05-28 LAB — GLUCOSE, CAPILLARY
Glucose-Capillary: 437 mg/dL — ABNORMAL HIGH (ref 70–99)
Glucose-Capillary: 301 mg/dL — ABNORMAL HIGH (ref 70–99)
Glucose-Capillary: 277 mg/dL — ABNORMAL HIGH (ref 70–99)
Glucose-Capillary: 285 mg/dL — ABNORMAL HIGH (ref 70–99)

## 2014-05-28 LAB — PROTIME-INR
INR: 1.18 (ref 0.00–1.49)
Prothrombin Time: 15 seconds (ref 11.6–15.2)

## 2014-05-28 LAB — BASIC METABOLIC PANEL
Anion gap: 8 (ref 5–15)
BUN: 18 mg/dL (ref 6–23)
CO2: 29 mEq/L (ref 19–32)
Calcium: 9 mg/dL (ref 8.4–10.5)
Chloride: 99 mEq/L (ref 96–112)
Creatinine, Ser: 0.94 mg/dL (ref 0.50–1.35)
GFR calc Af Amer: >90 mL/min (ref >90)
GFR calc non Af Amer: >90 mL/min (ref >90)
Glucose, Bld: 304 mg/dL — ABNORMAL HIGH (ref 70–99)
Potassium: 4.9 mEq/L (ref 3.7–5.3)
Sodium: 136 mEq/L — ABNORMAL LOW (ref 137–147)

## 2014-05-28 LAB — TROPONIN I: Troponin I: <0.3 ng/mL (ref <0.30)

## 2014-05-28 MED ORDER — AZITHROMYCIN 500 MG PO TABS
500.0000 mg | ORAL_TABLET | Freq: Every day | ORAL | Status: AC
  Administered 2014-05-28: 500 mg via ORAL
  Filled 2014-05-28: qty 1

## 2014-05-28 MED ORDER — WARFARIN SODIUM 7.5 MG PO TABS
15.0000 mg | ORAL_TABLET | Freq: Once | ORAL | Status: DC
  Filled 2014-05-28: qty 2

## 2014-05-28 MED ORDER — WARFARIN SODIUM 7.5 MG PO TABS
15.0000 mg | ORAL_TABLET | Freq: Once | ORAL | Status: AC
  Administered 2014-05-28: 15 mg via ORAL
  Filled 2014-05-28: qty 2

## 2014-05-28 MED ORDER — AZITHROMYCIN 250 MG PO TABS
250.0000 mg | ORAL_TABLET | Freq: Every day | ORAL | Status: AC
  Administered 2014-05-29 – 2014-06-01 (×4): 250 mg via ORAL
  Filled 2014-05-28 (×4): qty 1

## 2014-05-28 MED ORDER — PANTOPRAZOLE SODIUM 40 MG PO TBEC
40.0000 mg | DELAYED_RELEASE_TABLET | Freq: Every day | ORAL | Status: DC
  Administered 2014-05-28 – 2014-06-01 (×5): 40 mg via ORAL
  Filled 2014-05-28 (×5): qty 1

## 2014-05-28 NOTE — Progress Notes (Signed)
Patient BP low this am 84/56, no c/o dizziness, no cp, patient still having shortness of breath occasionally. MD notified, DR. Patel instruct to hold coreg, lasix, losartan, spironolactone. Will continue to monitor.

## 2014-05-28 NOTE — Progress Notes (Signed)
Internal Medicine On-Call Attending  Date: 05/28/2014  Patient name: Dennis [name] Medical record number: [id number] Date of birth: 16-Sep-1961 Age: 53 y.o. Gender: male  I saw and evaluated the patient. I discussed patient and reviewed the resident's note by Dr. Hayes Ludwig, and I agree with the resident's findings and plans as documented in her note.  Dr. Lynnae January will take over as attending physician tomorrow 05/29/2014.

## 2014-05-28 NOTE — Progress Notes (Signed)
SATURATION QUALIFICATIONS: (This note is used to comply with regulatory documentation for home oxygen)  Patient Saturations on Room Air at Rest =92%  Patient Saturations on Room Air while Ambulating =93%  Patient Saturations on 2 Liters of oxygen while Ambulating =94%  Please briefly explain why patient needs home oxygen:Patient c/o shortness of breath while ambulating with and without  oxygen.

## 2014-05-28 NOTE — Progress Notes (Signed)
Subjective: He had one episode of chest pain last night. The pain was located in his left chest and subsided on its own. No diaphoresis, SOB, or N/V associated with the pain. He still has a cough productive of clear sputum. He has post-nasal drip with nasal congestion.   Objective: Vital signs in last 24 hours: Filed Vitals:   05/27/14 1701 05/27/14 2139 05/28/14 0158 05/28/14 0500  BP: 93/62 93/64 93/70  92/68  Pulse: 100 97  99  Temp:  98.4 F (36.9 C)  98 F (36.7 C)  TempSrc:  Oral  Oral  Resp:  20  20  Height:      Weight:    157 lb 10.1 oz (71.5 kg)  SpO2:  100%  100%   Weight change: 2 lb 10.1 oz (1.192 kg)  Intake/Output Summary (Last 24 hours) at 05/28/14 0842 Last data filed at 05/28/14 0644  Gross per 24 hour  Intake   1203 ml  Output   2100 ml  Net   -897 ml   Vitals reviewed.  General: resting in bed, in NAD  HEENT: no scleral icterus, nasal congestion with swollen turbinates bilaterally, no clear discharge Cardiac: RRR, +gallop. No murmurs.  Pulm: bibasilar crackles, no wheezes, rales, or rhonchi  Abd: soft, nontender, nondistended, BS present  Ext: warm and well perfused, no pedal edema  Neuro: alert and oriented X3, moves all extremities voluntairly   Lab Results: Basic Metabolic Panel:  Recent Labs Lab 05/26/14 1138 05/27/14 0142  NA 141 137  K 4.1 4.3  CL 105 98  CO2 25 30  GLUCOSE 232* 414*  BUN 8 13  CREATININE 0.83 0.92  CALCIUM 8.8 8.7   CBC:  Recent Labs Lab 05/26/14 1138 05/27/14 0142  WBC 9.2 8.1  HGB 11.1* 11.6*  HCT 34.9* 36.0*  MCV 99.7 97.3  PLT 261 276   Cardiac Enzymes:  Recent Labs Lab 05/26/14 1138 05/26/14 2017 05/27/14 0142  TROPONINI <0.30 <0.30 <0.30   BNP:  Recent Labs Lab 05/26/14 1138  PROBNP 1034.0*   D-Dimer:  Recent Labs Lab 05/26/14 1138  DDIMER <0.27   CBG:  Recent Labs Lab 05/27/14 1127 05/27/14 1622 05/27/14 1917 05/27/14 2126 05/28/14 0555 05/28/14 0736  GLUCAP 147*  406* 199* 126* 437* 301*   Hemoglobin A1C:  Recent Labs Lab 05/26/14 2017  HGBA1C 11.0*   Thyroid Function Tests:  Recent Labs Lab 05/26/14 2017  TSH 0.934   Coagulation:  Recent Labs Lab 05/26/14 1138 05/27/14 0142 05/28/14 0508  LABPROT 17.9* 17.1* 15.0  INR 1.48 1.39 1.18   Urine Drug Screen: Drugs of Abuse     Component Value Date/Time   LABOPIA NONE DETECTED 05/26/2014 1423   COCAINSCRNUR POSITIVE* 05/26/2014 1423   LABBENZ NONE DETECTED 05/26/2014 1423   AMPHETMU NONE DETECTED 05/26/2014 1423   THCU NONE DETECTED 05/26/2014 1423   LABBARB NONE DETECTED 05/26/2014 1423     Micro Results: No results found for this or any previous visit (from the past 240 hour(s)). Studies/Results: Dg Chest 2 View  05/27/2014   CLINICAL DATA:  Dry cough.  EXAM: CHEST  2 VIEW  COMPARISON:  05/26/2014.  FINDINGS: Cardiac silhouette is normal in size. No mediastinal or hilar masses. Stable left anterior chest wall sequential pacemaker.  Lungs are hyperexpanded. There is central vascular prominence and bilateral interstitial thickening most evident in the lung bases. There is more confluent lung base type opacity on the lateral view posteriorly. This is stable from the previous day's  study no pleural effusion. No pneumothorax.  Bony thorax is intact.  IMPRESSION: 1. Diffuse interstitial thickening with more confluent lung base opacity. This is stable from the previous day's study. There is interstitial thickening has developed since a chest radiograph dated 08/03/2011. As noted previously, this may reflect interstitial edema. Diffuse interstitial infection or inflammation is possible.   Electronically Signed   By: Lajean Manes M.D.   On: 05/27/2014 12:51   Dg Chest 2 View  05/26/2014   CLINICAL DATA:  Dry cough and shortness of breath. Left-sided chest pain.  EXAM: CHEST  2 VIEW  COMPARISON:  05/15/2014.  FINDINGS: Trachea is midline. Heart size stable. Left subclavian pacemaker lead tips are  in the right atrium and right ventricle. There is new basilar predominant interstitial prominence and indistinctness with peripheral septal lines. No focal airspace consolidation. Tiny bilateral pleural effusions.  IMPRESSION: 1. Mild congestive heart failure. 2. Previously seen nodular density in the right infrahilar region is poorly appreciated.   Electronically Signed   By: Lorin Picket M.D.   On: 05/26/2014 12:30   Medications: I have reviewed the patient's current medications. Scheduled Meds: . aspirin EC  81 mg Oral Daily  . atorvastatin  40 mg Oral q1800  . carvedilol  6.25 mg Oral BID WC  . furosemide  40 mg Oral Daily  . insulin aspart  0-9 Units Subcutaneous TID WC  . insulin glargine  32 Units Subcutaneous QHS  . losartan  25 mg Oral Daily  . sodium chloride  3 mL Intravenous Q12H  . sodium chloride  3 mL Intravenous Q12H  . spironolactone  12.5 mg Oral Daily  . warfarin  1 each Does not apply Once  . Warfarin - Pharmacist Dosing Inpatient   Does not apply q1800   Continuous Infusions:  PRN Meds:.sodium chloride, acetaminophen, acetaminophen, sodium chloride Assessment/Plan: 53 year old man with PMH of Left ventricular non-compaction s/p ICD placement, on coumadin, EF of 20-24% per 2D echo in 10/2010, followed at [facility], presenting with dry cough for months, increased DOE for 2 weeks, UDS+ for cocaine.   CHF execerbation - In the setting of cocaine use. He has history of left ventricular non-compaction which has been worked up and treated at [facility]. Last ECHO on 10/2010 showed EF 20-24%, global hypokinesis. He is s/p ICD implant, is on coumadin. On presentation he appeared mildly volume overloaded with cough and orthopenia, weight up by 5 lb (from 153). He diuresed well with IV Lasix but had persistent orthopnea. Net negative by 3.5L but he is noncompliant with a low sodium diet, had pizza for dinner on 8/15 and again complains of cough and DOE today. Repeat 2D echo on 05/27/14: EF 20-25%,  diffuse hypokinesis, akinesis of entireapical myocardium, akinesis of entireanterior myocardium. Repeat CXR on 05/27/14 with mild interstitial edema and patient still symptomatic.  - I/o, daily weights, CBC, BMP  - Lasix 40mg  PO daily - INR subtherapeutic - needs to f/up with PCP at [facility] immediately after d/c immediately for INR f/up.  - also needs cardiology f/up. Patient wants to transfer care to [facility] but he must arrange this with his current medical team at [facility].  - Continue home coreg 6.250 BID, losartan 25mg  daily, spironolactone 12.5mg  daily.  - D/ced home lisinopril (given cough and also since on losartan)  - Ambulate patient with oxygen measuring - Consult Cardiology for further recommendations as pt remains symptomatic and now appears euvolemic   HTN- He is on  coreg  6.250 BID, losartan 25mg  daily, spironolactone 12.5mg  daily (restarted), and lisinopril 2.5mg  at home (pt had been taken of ACEi by his PCP but continued taking this med). BP is low this morning at 88/66 which could be due to overdiuresis.  -Hold spironolactone, Losartan -Continue Coreg -Hold Lasix    Chest pain: He complained of chest pain last night that resolved on its own, not associated with diaphoresis, or SOB. His EKG has no changes, troponin on presentation is negative.  -order troponin now, once -Start protonix 40mg  daily  -Continue monitoring  Cough: Complaints of worse cough in the past 2 weeks. Has nasal congestion with postnasal drip. Became productive of clear/yellow sputum on presentation.  -Start Zpack for possible bronchitis with close INR monitoring -Repeat EKG in am for Qtc eval --qtc normal  Hyperlipiedemia - - cont lipitor 40 mg daily   DM II - Uncontrolled. HgA1C of 11%. He is on Lantus 32 units qHS at home with Novolog SSI. He received Lantus 20 units on eve of 8/14 with CBG of 312 at 5 am. He refused Lantus last night and his CBG was 437 this am which trended down with  Novolog 9 units. -Continue home Lantus 32 units -Continue SSI -sensitive  Left ventricular non-compaction - on Anticoguatlion. Goal INR of 2-3. Not therapeutic currently. Was taking 7mg  coumadin daily. Coumadin per pharmacy. Also on 325mg  ASA at home.  -Will do 81mg  asa daily, concern for risk of bleed with coumadin.  -needs f/up outpatient for INR checks.  - has defib placed for LV noncompaction disease.   Cocaine Abuse  -discussed cocaine cessation   Tobacco abuse  -discussed cessation    Dispo: Disposition is deferred at this time, awaiting improvement of current medical problems.  Anticipated discharge in approximately 1 day(s).   The patient does have a current PCP (Minda Ditto, MD) and does not need an [facility] hospital follow-up appointment after discharge.  The patient does not have transportation limitations that hinder transportation to clinic appointments.  .Services Needed at time of discharge: Y = Yes, Blank = No PT:   OT:   RN:   Equipment:   Other:     LOS: 2 days   Blain Pais, MD 05/28/2014, 8:42 AM

## 2014-05-28 NOTE — Consult Note (Signed)
Cardiologist:  Dr. Marzetta Board at [facility] Reason for Consult: CHF Referring Physician:   [name] is an 53 y.o. male.  HPI:   The patient is a 53 yo male with a history of ischemic CM with an EF of 20-25%(echo 05/27/14).  In 2009 when he was first diagnosed, it was 10%.  He reports never having an MI or stent placed.  His AICD gets checked a [facility].  He also has DMI-poorly controlled-A1C 11.0, OSA, HTN, and drug abuse history.  He presents with a dry cough for three weeks and worsening SOB for the three days prior to admission.  His cardiologist, who he saw about 10-14 days ago, prescribed some lasix recently but he did not pick it up yet.  On Thursday he developed orthopnea and had some chills.  While here his cough began to produce phlegm.  He had one episode of "sharp" CP yesterday which resolved spontaneously.    BNP was elevated to 1034 on 8/14.   Net fluids: -0.9L/ -3.6L(since adm).  He has received a dose of 42m IV lasix and doses of 472mPO and 2013mO since adm.  Troponin is negative.   He was also started on abx for bronchitis.  The patient currently denies nausea, vomiting, fever, chest pain, shortness of breath, orthopnea, dizziness, PND, cough, congestion, abdominal pain, hematochezia, melena, lower extremity edema, claudication.    Past Medical History  Diagnosis Date  . CHF (congestive heart failure)   . Drug abuse and dependence   . Automatic implantable cardioverter-defibrillator in situ   . Heart murmur   . Exertional shortness of breath   . Type I diabetes mellitus   . Pneumonia ~ 1988  . Sleep apnea     "cleared after T&A"  . Cardiomyopathy, ischemic 07/21/2011  . BP (high blood pressure) 03/04/2013  . Left ventricular noncompaction 05/27/2014    Past Surgical History  Procedure Laterality Date  . Forearm fracture surgery Left 1980  . Cardiac defibrillator placement  03/2011  . Cholecystectomy  1994  . Tonsillectomy and adenoidectomy  ~ 1998    Family History    Problem Relation Age of Onset  . Diabetes Mother   . Heart disease Mother   . Diabetes Father   . Prostate cancer Father   . Heart disease Father   . Diabetes Brother   . Diabetes Brother     Social History:  reports that he quit smoking 4 days ago. His smoking use included Cigarettes. He has a 17.5 pack-year smoking history. He has never used smokeless tobacco. He reports that he drinks alcohol. He reports that he uses illicit drugs (Cocaine).  Allergies: No Known Allergies  Medications:  Prior to Admission medications   Medication Sig Start Date End Date Taking? Authorizing Provider  aspirin 325 MG tablet Take 325 mg by mouth daily.   Yes Historical Provider, MD  carvedilol (COREG) 3.125 MG tablet Take 3.125 mg by mouth 2 (two) times daily with a meal.   Yes Historical Provider, MD  insulin glargine (LANTUS) 100 UNIT/ML injection Inject 32 Units into the skin at bedtime.   Yes Historical Provider, MD  lisinopril (PRINIVIL,ZESTRIL) 2.5 MG tablet Take 2.5 mg by mouth daily.   Yes Historical Provider, MD  losartan (COZAAR) 25 MG tablet Take 25 mg by mouth daily. 05/16/14 05/16/15 Yes Historical Provider, MD  QC PEN NEEDLES 31G X 6 MM MISC 1 each by Other route See admin instructions. Use daily with insulin. 08/06/12  Yes  Historical Provider, MD  simvastatin (ZOCOR) 80 MG tablet Take 80 mg by mouth at bedtime.   Yes Historical Provider, MD  spironolactone (ALDACTONE) 25 MG tablet Take 12.5 mg by mouth daily.    Yes Historical Provider, MD  warfarin (COUMADIN) 2 MG tablet Take 2 mg by mouth daily. 2 mg in addition to 5 mg   Yes Historical Provider, MD  warfarin (COUMADIN) 5 MG tablet Take 5 mg by mouth daily. 5 mg in addition to 2 mg   Yes Historical Provider, MD     Results for orders placed during the hospital encounter of 05/26/14 (from the past 48 hour(s))  TROPONIN I     Status: None   Collection Time    05/26/14  8:17 PM      Result Value Ref Range   Troponin I <0.30  <0.30 ng/mL    Comment:            Due to the release kinetics of cTnI,     a negative result within the first hours     of the onset of symptoms does not rule out     myocardial infarction with certainty.     If myocardial infarction is still suspected,     repeat the test at appropriate intervals.  TSH     Status: None   Collection Time    05/26/14  8:17 PM      Result Value Ref Range   TSH 0.934  0.350 - 4.500 uIU/mL  HEMOGLOBIN A1C     Status: Abnormal   Collection Time    05/26/14  8:17 PM      Result Value Ref Range   Hemoglobin A1C 11.0 (*) <5.7 %   Comment: (NOTE)                                                                               According to the ADA Clinical Practice Recommendations for 2011, when     HbA1c is used as a screening test:      >=6.5%   Diagnostic of Diabetes Mellitus               (if abnormal result is confirmed)     5.7-6.4%   Increased risk of developing Diabetes Mellitus     References:Diagnosis and Classification of Diabetes Mellitus,Diabetes     VXBL,3903,00(PQZRA 1):S62-S69 and Standards of Medical Care in             Diabetes - 2011,Diabetes Care,2011,34 (Suppl 1):S11-S61.   Mean Plasma Glucose 269 (*) <117 mg/dL   Comment: Performed at [place], [facility]     Status: Abnormal   Collection Time    05/26/14 10:16 PM      Result Value Ref Range   Glucose-Capillary 323 (*) 70 - 99 mg/dL   Comment 1 Notify RN    BASIC METABOLIC PANEL     Status: Abnormal   Collection Time    05/27/14  1:42 AM      Result Value Ref Range   Sodium 137  137 - 147 mEq/L   Potassium 4.3  3.7 - 5.3 mEq/L   Chloride 98  96 - 112  mEq/L   CO2 30  19 - 32 mEq/L   Glucose, Bld 414 (*) 70 - 99 mg/dL   BUN 13  6 - 23 mg/dL   Creatinine, Ser 0.92  0.50 - 1.35 mg/dL   Calcium 8.7  8.4 - 10.5 mg/dL   GFR calc non Af Amer >90  >90 mL/min   GFR calc Af Amer >90  >90 mL/min   Comment: (NOTE)     The eGFR has been calculated using the CKD EPI equation.      This calculation has not been validated in all clinical situations.     eGFR's persistently <90 mL/min signify possible Chronic Kidney     Disease.   Anion gap 9  5 - 15  CBC     Status: Abnormal   Collection Time    05/27/14  1:42 AM      Result Value Ref Range   WBC 8.1  4.0 - 10.5 K/uL   RBC 3.70 (*) 4.22 - 5.81 MIL/uL   Hemoglobin 11.6 (*) 13.0 - 17.0 g/dL   HCT 36.0 (*) 39.0 - 52.0 %   MCV 97.3  78.0 - 100.0 fL   MCH 31.4  26.0 - 34.0 pg   MCHC 32.2  30.0 - 36.0 g/dL   RDW 13.1  11.5 - 15.5 %   Platelets 276  150 - 400 K/uL  TROPONIN I     Status: None   Collection Time    05/27/14  1:42 AM      Result Value Ref Range   Troponin I <0.30  <0.30 ng/mL   Comment:            Due to the release kinetics of cTnI,     a negative result within the first hours     of the onset of symptoms does not rule out     myocardial infarction with certainty.     If myocardial infarction is still suspected,     repeat the test at appropriate intervals.  PROTIME-INR     Status: Abnormal   Collection Time    05/27/14  1:42 AM      Result Value Ref Range   Prothrombin Time 17.1 (*) 11.6 - 15.2 seconds   INR 1.39  0.00 - 1.49  GLUCOSE, CAPILLARY     Status: Abnormal   Collection Time    05/27/14  5:49 AM      Result Value Ref Range   Glucose-Capillary 312 (*) 70 - 99 mg/dL   Comment 1 Notify RN    GLUCOSE, CAPILLARY     Status: Abnormal   Collection Time    05/27/14 11:27 AM      Result Value Ref Range   Glucose-Capillary 147 (*) 70 - 99 mg/dL   Comment 1 Documented in Chart     Comment 2 Notify RN    GLUCOSE, CAPILLARY     Status: Abnormal   Collection Time    05/27/14  4:22 PM      Result Value Ref Range   Glucose-Capillary 406 (*) 70 - 99 mg/dL   Comment 1 Documented in Chart     Comment 2 Notify RN    GLUCOSE, CAPILLARY     Status: Abnormal   Collection Time    05/27/14  7:17 PM      Result Value Ref Range   Glucose-Capillary 199 (*) 70 - 99 mg/dL   Comment 1 Documented in  Chart     Comment 2  Notify RN    GLUCOSE, CAPILLARY     Status: Abnormal   Collection Time    05/27/14  9:26 PM      Result Value Ref Range   Glucose-Capillary 126 (*) 70 - 99 mg/dL  PROTIME-INR     Status: None   Collection Time    05/28/14  5:08 AM      Result Value Ref Range   Prothrombin Time 15.0  11.6 - 15.2 seconds   INR 1.18  0.00 - 1.49  GLUCOSE, CAPILLARY     Status: Abnormal   Collection Time    05/28/14  5:55 AM      Result Value Ref Range   Glucose-Capillary 437 (*) 70 - 99 mg/dL   Comment 1 Notify RN     Comment 2 Documented in Chart    GLUCOSE, CAPILLARY     Status: Abnormal   Collection Time    05/28/14  7:36 AM      Result Value Ref Range   Glucose-Capillary 301 (*) 70 - 99 mg/dL   Comment 1 Documented in Chart     Comment 2 Notify RN    TROPONIN I     Status: None   Collection Time    05/28/14  9:51 AM      Result Value Ref Range   Troponin I <0.30  <0.30 ng/mL   Comment:            Due to the release kinetics of cTnI,     a negative result within the first hours     of the onset of symptoms does not rule out     myocardial infarction with certainty.     If myocardial infarction is still suspected,     repeat the test at appropriate intervals.  GLUCOSE, CAPILLARY     Status: Abnormal   Collection Time    05/28/14 11:10 AM      Result Value Ref Range   Glucose-Capillary 277 (*) 70 - 99 mg/dL   Comment 1 Notify RN    BASIC METABOLIC PANEL     Status: Abnormal   Collection Time    05/28/14 12:25 PM      Result Value Ref Range   Sodium 136 (*) 137 - 147 mEq/L   Potassium 4.9  3.7 - 5.3 mEq/L   Chloride 99  96 - 112 mEq/L   CO2 29  19 - 32 mEq/L   Glucose, Bld 304 (*) 70 - 99 mg/dL   BUN 18  6 - 23 mg/dL   Creatinine, Ser 0.94  0.50 - 1.35 mg/dL   Calcium 9.0  8.4 - 10.5 mg/dL   GFR calc non Af Amer >90  >90 mL/min   GFR calc Af Amer >90  >90 mL/min   Comment: (NOTE)     The eGFR has been calculated using the CKD EPI equation.     This  calculation has not been validated in all clinical situations.     eGFR's persistently <90 mL/min signify possible Chronic Kidney     Disease.   Anion gap 8  5 - 15    Dg Chest 2 View  05/27/2014   CLINICAL DATA:  Dry cough.  EXAM: CHEST  2 VIEW  COMPARISON:  05/26/2014.  FINDINGS: Cardiac silhouette is normal in size. No mediastinal or hilar masses. Stable left anterior chest wall sequential pacemaker.  Lungs are hyperexpanded. There is central vascular prominence and bilateral interstitial thickening most evident in the lung  bases. There is more confluent lung base type opacity on the lateral view posteriorly. This is stable from the previous day's study no pleural effusion. No pneumothorax.  Bony thorax is intact.  IMPRESSION: 1. Diffuse interstitial thickening with more confluent lung base opacity. This is stable from the previous day's study. There is interstitial thickening has developed since a chest radiograph dated 08/03/2011. As noted previously, this may reflect interstitial edema. Diffuse interstitial infection or inflammation is possible.   Electronically Signed   By: Lajean Manes M.D.   On: 05/27/2014 12:51    Review of Systems  All other systems reviewed and are negative. The patient currently denies nausea, vomiting, fever, chest pain, dizziness, PND, abdominal pain, hematochezia, melena, lower extremity edema, claudication.  Blood pressure 90/63, pulse 100, temperature 97.7 F (36.5 C), temperature source Oral, resp. rate 20, height _0  (1.854 m), weight 157 lb 10.1 oz (71.5 kg), SpO2 98.00%. Physical Exam  Nursing note and vitals reviewed. Constitutional: He is oriented to person, place, and time. He appears well-developed and well-nourished. No distress.  HENT:  Head: Normocephalic.  Eyes: EOM are normal. Pupils are equal, round, and reactive to light. No scleral icterus.  Neck: Normal range of motion. Neck supple. No JVD present.  Cardiovascular: Normal rate, regular  rhythm, S1 normal and S2 normal.   No murmur heard. Pulses:      Radial pulses are 2+ on the right side, and 2+ on the left side.       Dorsalis pedis pulses are 2+ on the right side, and 2+ on the left side.  No carotid bruit  Respiratory: Effort normal. He has no wheezes. He has rales (left sided coarse crackles).  GI: Soft. Bowel sounds are normal. He exhibits no distension. There is no tenderness.  Musculoskeletal: He exhibits no edema.  Lymphadenopathy:    He has no cervical adenopathy.  Neurological: He is alert and oriented to person, place, and time. He exhibits normal muscle tone.  Skin: Skin is warm and dry.  Psychiatric: He has a normal mood and affect.   Study Conclusions  - Left ventricle: There appears to be noncompaction of the LV and RV. The cavity size was moderately dilated. Systolic function was severely reduced. The estimated ejection fraction was in the range of 20% to 25%. Diffuse hypokinesis. There is akinesis of the entireanteroseptal myocardium. There is akinesis of the entireapical myocardium. There is akinesis of the entireanterior myocardium. There was mild spontaneous echo contrast, indicative of stasis. - Aortic valve: There was trivial regurgitation. - Mitral valve: There was moderate regurgitation. - Left atrium: The atrium was moderately to severely dilated. - Pulmonary arteries: PA peak pressure: 32 mm Hg (S).   Assessment/Plan: Principal Problem:   SOB Active Problems:   DIABETES MELLITUS, TYPE II   TOBACCO ABUSE   Drug abuse and dependence   CHF (congestive heart failure)   Long term (current) use of anticoagulants   Dyspnea   Left ventricular noncompaction   Hypotension  Plan: The patient appears to be doing well and improving.  We talked about monitoring daily weight and decreasing sodium intake.  He recently was adding salt to his grits three days a week.  His EF is about the same(20-25% now, diffuse hypokinesis) as is was when  checked last a [facility](19%).  His symptoms are probably a combination of acute on chronic sys CHF and bronchitis.  He can probably go home on PRN lasix.  He would like to get set up  with [facility] so he does not have to travel to [facility].   Coumadin is not therapeutic.  He is on ASA, Coreg, ARB and spironolactone.   SCr stable.     Tarri Fuller, [place] 05/28/2014, 3:19 PM   I have seen and examined the patient along with HAGER, BRYAN, PAC.  I have reviewed the chart, notes and new data.  I agree with PA's note.  Key new complaints: breathing is markedly improved; no history of stroke/known thromboembolic events Key examination changes: +loud S3, faint holosystolic apical murmur Key new findings / data: normal renal function and electrolyes  PLAN: Needs warfarin anticoagulation adjusted, but not critical that he be "bridged" or be perfectly therapeutic before DC Still appears to be hypervolemic, try to diurese some more before DC. By his report, usually 150 lb on home scale when feeling well. BP is limiting factor in Rx w HF meds. Will arrange F/U in [place] since he has difficulty getting to [facility] for f/u. I will be glad to see him in clinic.  Sanda Klein, MD, [place] [phone] 05/28/2014, 5:20 PM

## 2014-05-28 NOTE — Progress Notes (Signed)
PHARMACY NOTE  Pharmacy Consult :  53 y.o. male is currently on chronic Coumadin for history of CHF, ICM, L-ventricular compaction  Dosing Wt :  71.5 kg  Hematology :  Recent Labs  05/26/14 1138 05/27/14 0142 05/28/14 0508  HGB 11.1* 11.6*  --   HCT 34.9* 36.0*  --   PLT 261 276  --   LABPROT 17.9* 17.1* 15.0  INR 1.48 1.39 1.18  CREATININE 0.83 0.92  --     Current Medication[s] Include: Medication PTA: Prescriptions prior to admission  Medication Sig Dispense Refill  . aspirin 325 MG tablet Take 325 mg by mouth daily.      . carvedilol (COREG) 3.125 MG tablet Take 3.125 mg by mouth 2 (two) times daily with a meal.      . insulin glargine (LANTUS) 100 UNIT/ML injection Inject 32 Units into the skin at bedtime.      Marland Kitchen lisinopril (PRINIVIL,ZESTRIL) 2.5 MG tablet Take 2.5 mg by mouth daily.      Marland Kitchen losartan (COZAAR) 25 MG tablet Take 25 mg by mouth daily.      . QC PEN NEEDLES 31G X 6 MM MISC 1 each by Other route See admin instructions. Use daily with insulin.      . simvastatin (ZOCOR) 80 MG tablet Take 80 mg by mouth at bedtime.      Marland Kitchen spironolactone (ALDACTONE) 25 MG tablet Take 12.5 mg by mouth daily.       Marland Kitchen warfarin (COUMADIN) 2 MG tablet Take 2 mg by mouth daily. 2 mg in addition to 5 mg      . warfarin (COUMADIN) 5 MG tablet Take 5 mg by mouth daily. 5 mg in addition to 2 mg       Scheduled:  Scheduled:  . aspirin EC  81 mg Oral Daily  . atorvastatin  40 mg Oral q1800  . azithromycin  500 mg Oral Daily   Followed by  . [START ON 05/29/2014] azithromycin  250 mg Oral Daily  . carvedilol  6.25 mg Oral BID WC  . insulin aspart  0-9 Units Subcutaneous TID WC  . insulin glargine  32 Units Subcutaneous QHS  . losartan  25 mg Oral Daily  . pantoprazole  40 mg Oral Daily  . sodium chloride  3 mL Intravenous Q12H  . sodium chloride  3 mL Intravenous Q12H  . spironolactone  12.5 mg Oral Daily  . warfarin  1 each Does not apply Once  .  Warfarin - Pharmacist Dosing Inpatient   Does not apply q1800   Infusion[s]: Infusions:   Antibiotic[s]: Anti-infectives   Start     Dose/Rate Route Frequency Ordered Stop   05/29/14 1000  azithromycin (ZITHROMAX) tablet 250 mg     250 mg Oral Daily 05/28/14 1133 06/02/14 0959   05/28/14 1200  azithromycin (ZITHROMAX) tablet 500 mg     500 mg Oral Daily 05/28/14 1133 05/29/14 0959      Assessment :  Today's INR down further toward baseline.  Noted patient did not receive Coumadin 8/14.  INR 1.18.    Patient currently not receiving any Heparin or Lovenox.  No evidence of bleeding complications observed.  Goal :  INR goal is 2-3  Plan : 1. Due to SUB-therapeutic INR, consider SQ Heparin or Lovenox bridging until INR therapeutic.  Would recommend sq Heparin 5000 units q 8 hours or Lovenox 70 mg sq q 12 hours [no renal adjustment required]. 2. Will repeat Coumadin 15 mg today.  3. Daily INR's, CBC. Monitor for bleeding complications.   Follow Platelet counts.  Stramoski, Craig Guess,  Pharm.D  05/28/2014  11:47 AM

## 2014-05-28 NOTE — Plan of Care (Signed)
Problem: Phase I Progression Outcomes Goal: EF % per last Echo/documented,Core Reminder form on chart Outcome: Completed/Met Date Met:  05/28/14 EF 20-25%     

## 2014-05-28 NOTE — Progress Notes (Signed)
The patient's blood sugar was 437 this morning.  The MD was notified.  New orders were given to administer 9 units of Novolog insulin.  The RN carried out the orders.

## 2014-05-28 NOTE — Progress Notes (Signed)
The patient was handed out a heart failure packet at 2030 and was educated on diet and fluid restrictions.  He voiced his understanding.  About thirty minutes later, he was found to be eating pizza in his room and was educated on the importance of avoiding pizza due to his heart failure.  The patient also refused his Lantus insulin at this time due to a fear of his blood sugar dropping overnight.  His blood sugar was 126 at this time.

## 2014-05-29 ENCOUNTER — Encounter (HOSPITAL_COMMUNITY): Payer: Self-pay | Admitting: Interventional Cardiology

## 2014-05-29 ENCOUNTER — Telehealth: Payer: Self-pay | Admitting: Cardiovascular Disease

## 2014-05-29 DIAGNOSIS — E785 Hyperlipidemia, unspecified: Secondary | ICD-10-CM

## 2014-05-29 DIAGNOSIS — Z95810 Presence of automatic (implantable) cardiac defibrillator: Secondary | ICD-10-CM

## 2014-05-29 DIAGNOSIS — R059 Cough, unspecified: Secondary | ICD-10-CM

## 2014-05-29 DIAGNOSIS — I10 Essential (primary) hypertension: Secondary | ICD-10-CM

## 2014-05-29 DIAGNOSIS — R05 Cough: Secondary | ICD-10-CM

## 2014-05-29 DIAGNOSIS — R079 Chest pain, unspecified: Secondary | ICD-10-CM

## 2014-05-29 DIAGNOSIS — F1410 Cocaine abuse, uncomplicated: Secondary | ICD-10-CM

## 2014-05-29 DIAGNOSIS — F17200 Nicotine dependence, unspecified, uncomplicated: Secondary | ICD-10-CM

## 2014-05-29 DIAGNOSIS — IMO0001 Reserved for inherently not codable concepts without codable children: Secondary | ICD-10-CM

## 2014-05-29 DIAGNOSIS — E1165 Type 2 diabetes mellitus with hyperglycemia: Secondary | ICD-10-CM

## 2014-05-29 DIAGNOSIS — I5023 Acute on chronic systolic (congestive) heart failure: Principal | ICD-10-CM | POA: Diagnosis present

## 2014-05-29 LAB — GLUCOSE, CAPILLARY
Glucose-Capillary: 173 mg/dL — ABNORMAL HIGH (ref 70–99)
Glucose-Capillary: 255 mg/dL — ABNORMAL HIGH (ref 70–99)
Glucose-Capillary: 157 mg/dL — ABNORMAL HIGH (ref 70–99)
Glucose-Capillary: 282 mg/dL — ABNORMAL HIGH (ref 70–99)

## 2014-05-29 LAB — PROTIME-INR
INR: 2.24 — ABNORMAL HIGH (ref 0.00–1.49)
Prothrombin Time: 24.8 seconds — ABNORMAL HIGH (ref 11.6–15.2)

## 2014-05-29 MED ORDER — CARVEDILOL 3.125 MG PO TABS
3.1250 mg | ORAL_TABLET | Freq: Two times a day (BID) | ORAL | Status: DC
  Administered 2014-05-30: 3.125 mg via ORAL
  Filled 2014-05-29 (×3): qty 1

## 2014-05-29 MED ORDER — ASPIRIN 81 MG PO CHEW
81.0000 mg | CHEWABLE_TABLET | ORAL | Status: AC
  Administered 2014-05-30: 81 mg via ORAL
  Filled 2014-05-29: qty 1

## 2014-05-29 MED ORDER — SODIUM CHLORIDE 0.9 % IJ SOLN: 3.0000 mL | INTRAMUSCULAR | Status: DC | PRN

## 2014-05-29 MED ORDER — GUAIFENESIN-DM 100-10 MG/5ML PO SYRP
5.0000 mL | ORAL_SOLUTION | ORAL | Status: DC | PRN
  Administered 2014-05-29 – 2014-05-31 (×3): 5 mL via ORAL
  Filled 2014-05-29 (×3): qty 5

## 2014-05-29 MED ORDER — FUROSEMIDE 20 MG PO TABS
20.0000 mg | ORAL_TABLET | Freq: Every day | ORAL | Status: DC
  Administered 2014-05-29: 20 mg via ORAL
  Filled 2014-05-29: qty 1

## 2014-05-29 MED ORDER — SODIUM CHLORIDE 0.9 % IJ SOLN
3.0000 mL | Freq: Two times a day (BID) | INTRAMUSCULAR | Status: DC
  Administered 2014-05-29 – 2014-05-30 (×2): 3 mL via INTRAVENOUS

## 2014-05-29 MED ORDER — SODIUM CHLORIDE 0.9 % IV SOLN: 250.0000 mL | INTRAVENOUS | Status: DC | PRN

## 2014-05-29 MED ORDER — LIVING BETTER WITH HEART FAILURE BOOK
Freq: Once | Status: AC
  Administered 2014-05-29: 1
  Filled 2014-05-29: qty 1

## 2014-05-29 MED ORDER — WARFARIN SODIUM 10 MG PO TABS
10.0000 mg | ORAL_TABLET | Freq: Once | ORAL | Status: AC
  Administered 2014-05-29: 10 mg via ORAL
  Filled 2014-05-29: qty 1

## 2014-05-29 MED ORDER — FUROSEMIDE 10 MG/ML IJ SOLN
80.0000 mg | Freq: Two times a day (BID) | INTRAMUSCULAR | Status: DC
  Administered 2014-05-29 – 2014-05-30 (×2): 80 mg via INTRAVENOUS
  Filled 2014-05-29 (×4): qty 8

## 2014-05-29 NOTE — Progress Notes (Signed)
Patient is alert and oriented x 4. Denies c/o pain/discomfort at present time. Patient is maintained on telemetry and is sinus rhythm with first degree av block. Encouraged patient to call with any additional needs/concerns, to which he verbalized understanding. Will continue to monitor.  Esperanza Heir, RN

## 2014-05-29 NOTE — Progress Notes (Signed)
  Date: 05/29/2014  Patient name: Dennis [name]  Medical record number: [id number]  Date of birth: 10/23/1960   This patient has been seen and the plan of care was discussed with the house staff. Please see their note for complete details. I concur with their findings with the following additions/corrections: Has B crackles in bases. BP now better. Resume Lasix but at lower dose. Likely D/C in AM.   Bartholomew Crews, MD 05/29/2014, 12:54 PM

## 2014-05-29 NOTE — Progress Notes (Signed)
Dr. Genene Churn notified that patient states that he does not like his cardiologist. Patient states that he can not afford his coumadin. Case management consult made .

## 2014-05-29 NOTE — Progress Notes (Signed)
Notified on-call physician that patient has been coughing very frequently and its a very hard cough to the point at times is not able to finish conversation with nurse when he is asking questions. Orders given for PRN cough medication and will administer as ordered.

## 2014-05-29 NOTE — Progress Notes (Signed)
Chart review complete.  Patient is not eligible for [facility] Care Management services because his/her PCP is not a [facility] primary care provider or is not [facility] affiliated.  For any additional questions or new referrals please contact [facility] Liaison at [phone]

## 2014-05-29 NOTE — Progress Notes (Addendum)
Subjective:  Feels better today. Denies chest pain. Has some cough, breathing is better but still some SOB.  Objective: Vital signs in last 24 hours: Filed Vitals:   05/28/14 2119 05/29/14 0206 05/29/14 0208 05/29/14 0518  BP: 87/68 88/68 90/70  104/72  Pulse: 102 103 106 108  Temp: 99.1 F (37.3 C) 98.4 F (36.9 C)  98.2 F (36.8 C)  TempSrc:      Resp: 20 20 19 20   Height:      Weight:    71.4 kg (157 lb 6.5 oz)  SpO2: 94% 89% 91% 99%   Weight change: -0.1 kg (-3.5 oz)  Intake/Output Summary (Last 24 hours) at 05/29/14 1146 Last data filed at 05/29/14 0900  Gross per 24 hour  Intake    840 ml  Output   1125 ml  Net   -285 ml   Vitals reviewed.  General: resting in bed, in NAD  HEENT: no scleral icterus, nasal congestion with swollen turbinates bilaterally, no clear discharge Cardiac: RRR, +gallop. No murmurs.  Pulm: bibasilar crackles, no wheezes, rales, or rhonchi  Abd: soft, nontender, nondistended, BS present  Ext: warm and well perfused, no pedal edema  Neuro: alert and oriented X3, moves all extremities voluntairly   Lab Results: Basic Metabolic Panel:  Recent Labs Lab 05/27/14 0142 05/28/14 1225  NA 137 136*  K 4.3 4.9  CL 98 99  CO2 30 29  GLUCOSE 414* 304*  BUN 13 18  CREATININE 0.92 0.94  CALCIUM 8.7 9.0   CBC:  Recent Labs Lab 05/26/14 1138 05/27/14 0142  WBC 9.2 8.1  HGB 11.1* 11.6*  HCT 34.9* 36.0*  MCV 99.7 97.3  PLT 261 276   Cardiac Enzymes:  Recent Labs Lab 05/26/14 2017 05/27/14 0142 05/28/14 0951  TROPONINI <0.30 <0.30 <0.30   BNP:  Recent Labs Lab 05/26/14 1138  PROBNP 1034.0*   D-Dimer:  Recent Labs Lab 05/26/14 1138  DDIMER <0.27   CBG:  Recent Labs Lab 05/27/14 2126 05/28/14 0555 05/28/14 0736 05/28/14 1110 05/28/14 1659 05/29/14 0627  GLUCAP 126* 437* 301* 277* 285* 173*   Hemoglobin A1C:  Recent Labs Lab 05/26/14 2017  HGBA1C 11.0*   Thyroid Function Tests:  Recent Labs Lab  05/26/14 2017  TSH 0.934   Coagulation:  Recent Labs Lab 05/26/14 1138 05/27/14 0142 05/28/14 0508 05/29/14 0639  LABPROT 17.9* 17.1* 15.0 24.8*  INR 1.48 1.39 1.18 2.24*   Urine Drug Screen: Drugs of Abuse     Component Value Date/Time   LABOPIA NONE DETECTED 05/26/2014 1423   COCAINSCRNUR POSITIVE* 05/26/2014 1423   LABBENZ NONE DETECTED 05/26/2014 1423   AMPHETMU NONE DETECTED 05/26/2014 1423   THCU NONE DETECTED 05/26/2014 1423   LABBARB NONE DETECTED 05/26/2014 1423     Micro Results: No results found for this or any previous visit (from the past 240 hour(s)). Studies/Results: Dg Chest 2 View  05/27/2014   CLINICAL DATA:  Dry cough.  EXAM: CHEST  2 VIEW  COMPARISON:  05/26/2014.  FINDINGS: Cardiac silhouette is normal in size. No mediastinal or hilar masses. Stable left anterior chest wall sequential pacemaker.  Lungs are hyperexpanded. There is central vascular prominence and bilateral interstitial thickening most evident in the lung bases. There is more confluent lung base type opacity on the lateral view posteriorly. This is stable from the previous day's study no pleural effusion. No pneumothorax.  Bony thorax is intact.  IMPRESSION: 1. Diffuse interstitial thickening with more confluent lung base opacity. This is  stable from the previous day's study. There is interstitial thickening has developed since a chest radiograph dated 08/03/2011. As noted previously, this may reflect interstitial edema. Diffuse interstitial infection or inflammation is possible.   Electronically Signed   By: Lajean Manes M.D.   On: 05/27/2014 12:51   Medications: I have reviewed the patient's current medications. Scheduled Meds: . aspirin EC  81 mg Oral Daily  . atorvastatin  40 mg Oral q1800  . azithromycin  250 mg Oral Daily  . carvedilol  6.25 mg Oral BID WC  . furosemide  20 mg Oral Daily  . insulin aspart  0-9 Units Subcutaneous TID WC  . insulin glargine  32 Units Subcutaneous QHS  .  losartan  25 mg Oral Daily  . pantoprazole  40 mg Oral Daily  . sodium chloride  3 mL Intravenous Q12H  . sodium chloride  3 mL Intravenous Q12H  . spironolactone  12.5 mg Oral Daily  . warfarin  10 mg Oral ONCE-1800  . Warfarin - Pharmacist Dosing Inpatient   Does not apply q1800   Continuous Infusions:  PRN Meds:.sodium chloride, acetaminophen, acetaminophen, sodium chloride Assessment/Plan: 53 year old man with PMH of Left ventricular non-compaction s/p ICD placement, on coumadin, EF of 20-24% per 2D echo in 10/2010, followed at [facility], presenting with dry cough for months, increased DOE for 2 weeks, UDS+ for cocaine.   Acute on chronic systolic CHF execerbation - In the setting of cocaine use and salt indiscretion.   He has history of left ventricular non-compaction which has been worked up and treated at [facility]. Last ECHO on 10/2010 showed EF 20-24%, global hypokinesis. He is s/p ICD implant, is on coumadin. On presentation he appeared mildly volume overloaded with cough and orthopenia, weight up by 5 lb (from 153). He diuresed well with IV Lasix but had persistent orthopnea. Net negative by 3.5L but he is noncompliant with a low sodium diet, had pizza for dinner on 8/15 and again complains of cough and DOE today. Repeat 2D echo on 05/27/14: EF 20-25%, diffuse hypokinesis, akinesis of entireapical myocardium, akinesis of entireanterior myocardium, noncompaction of the LV and RV. Repeat CXR on 05/27/14 with mild interstitial edema and patient still symptomatic but improving.  - I/o, daily weights, CBC, BMP  - INR subtherapeutic - needs to f/up with PCP at [facility] immediately after d/c immediately for INR f/up.  - also needs cardiology f/up. Patient wants to transfer care to [facility] but he must arrange this with his current medical team at [facility].  - Continue home coreg 6.250 BID, losartan 25mg  daily, spironolactone 12.5mg  daily.  - Ambulate patient with oxygen measuring - Lasix was  held yesterday because of hypotension. Will give 20 PO lasix today as he still has room for diuresis based on volume overload on exam.  HTN- He is on  coreg 6.250 BID, losartan 25mg  daily, spironolactone 12.5mg  daily (restarted), and lisinopril 2.5mg  at home (pt had been taken of ACEi by his PCP but continued taking this med). BP is low  which could be due to overdiuresis.  -Continue Coreg 6.25mg , losartan 25mg , spironolactone 12.5mg  daily.  -lasix 20mg  PO. - d/ced lisinopril from home here since he has cough.   Chest pain: He complained of chest pain last night that resolved on its own, not associated with diaphoresis, or SOB. His EKG has no changes, troponin on presentation is negative.   -Start protonix 40mg  daily  -Continue monitoring  Cough: Complaints of worse cough in  the past 2 weeks. Has nasal congestion with postnasal drip. Became productive of clear/yellow sputum on presentation.  -Zpack for possible bronchitis with close INR monitoring -EKG - qtc normal  Hyperlipiedemia - - cont lipitor 40 mg daily   DM II - Uncontrolled. HgA1C of 11%. He is on Lantus 32 units qHS at home with Novolog SSI. He received Lantus 20 units on eve of 8/14 with CBG of 312 at 5 am. He refused Lantus but he did receive it last night as his BGS was in high 200's -Continue home Lantus 32 units -Continue SSI -sensitive  Left ventricular non-compaction - on Anticoguatlion. Goal INR of 2-3. Not therapeutic currently. Was taking 7mg  coumadin daily. Coumadin per pharmacy. Also on 325mg  ASA at home.  -Will do 81mg  asa daily, concern for risk of bleed with coumadin.  -needs f/up outpatient for INR checks. - has defib placed for LV noncompaction disease.   Cocaine Abuse  -discussed cocaine cessation   Tobacco abuse  -discussed cessation    Dispo: Disposition is deferred at this time, awaiting improvement of current medical problems.  Anticipated discharge in approximately 1 day(s).   The patient does  have a current PCP (Minda Ditto, MD) and does need an [facility] hospital follow-up appointment after discharge.  The patient does not have transportation limitations that hinder transportation to clinic appointments.  .Services Needed at time of discharge: Y = Yes, Blank = No PT:   OT:   RN:   Equipment:   Other:     LOS: 3 days   Dellia Nims, MD 05/29/2014, 11:46 AM

## 2014-05-29 NOTE — Progress Notes (Signed)
ANTICOAGULATION CONSULT NOTE   Pharmacy Consult for Coumadin Indication: atrial fibrillation  No Known Allergies  Labs:  Recent Labs  05/26/14 1138 05/26/14 2017 05/27/14 0142 05/28/14 0508 05/28/14 0951 05/28/14 1225 05/29/14 0639  HGB 11.1*  --  11.6*  --   --   --   --   HCT 34.9*  --  36.0*  --   --   --   --   PLT 261  --  276  --   --   --   --   LABPROT 17.9*  --  17.1* 15.0  --   --  24.8*  INR 1.48  --  1.39 1.18  --   --  2.24*  CREATININE 0.83  --  0.92  --   --  0.94  --   TROPONINI <0.30 <0.30 <0.30  --  <0.30  --   --     Estimated Creatinine Clearance: 92.8 ml/min (by C-G formula based on Cr of 0.94).   Medical History: Past Medical History  Diagnosis Date  . CHF (congestive heart failure)   . Drug abuse and dependence   . Automatic implantable cardioverter-defibrillator in situ   . Heart murmur   . Exertional shortness of breath   . Type I diabetes mellitus   . Pneumonia ~ 1988  . Sleep apnea     "cleared after T&A"  . Cardiomyopathy, ischemic 07/21/2011  . BP (high blood pressure) 03/04/2013  . Left ventricular noncompaction 05/27/2014    Assessment: 53 year old male admitted with CHF exacerbation after cocaine abuse.  On Coumadin PTA for Afib.  INR=2.24  Goal of Therapy:  INR 2-3 Monitor platelets by anticoagulation protocol: Yes   Plan:  1) Coumadin 10 mg po x 1 dose at 1800 pm 2) Daily INR  Thank you. Anette Guarneri, PharmD [phone]  05/29/2014,9:00 AM

## 2014-05-29 NOTE — Telephone Encounter (Signed)
No telephone number left to contact dr amid back

## 2014-05-29 NOTE — Telephone Encounter (Signed)
Dr. Gwynne Edinger called in stating that Dr. Loletha Grayer saw this pt in the hospital on 08/14 and was very interested in seeing this pt in the follow up visit in 2 wks but Dr. Is booked up through Sept. Please call  Thanks

## 2014-05-29 NOTE — Progress Notes (Signed)
The patient's CBG was 252.  The patient does not have HS SSI insulin.  The MD was notified.  Orders were given to administer his Lantus insulin.

## 2014-05-29 NOTE — Discharge Summary (Signed)
Name: Dennis [name] MRN: [id number] DOB: [birth year] 53 y.o. PCP: Minda Ditto, MD  Date of Admission: 05/26/2014 11:11 AM Date of Discharge: 06/01/2014 Attending Physician: Bartholomew Crews, MD  Discharge Diagnosis: Acute on chronic systolic CHF execerbation  Principal Problem:   SOB Active Problems:   DIABETES MELLITUS, TYPE II   TOBACCO ABUSE   Drug abuse and dependence   CHF (congestive heart failure)   Long term (current) use of anticoagulants   Dyspnea   Left ventricular noncompaction   Acute on chronic systolic heart failure   Automatic implantable cardioverter-defibrillator in situ  Discharge Medications:   Medication List    STOP taking these medications       aspirin 325 MG tablet     lisinopril 2.5 MG tablet  Commonly known as:  PRINIVIL,ZESTRIL      TAKE these medications       carvedilol 3.125 MG tablet  Commonly known as:  COREG  Take 3.125 mg by mouth 2 (two) times daily with a meal.     furosemide 20 MG tablet  Commonly known as:  LASIX  Take 1 tablet (20 mg total) by mouth every Monday, Wednesday, and Friday.     insulin glargine 100 UNIT/ML injection  Commonly known as:  LANTUS  Inject 32 Units into the skin at bedtime.     losartan 25 MG tablet  Commonly known as:  COZAAR  Take 0.5 tablets (12.5 mg total) by mouth daily.     pantoprazole 40 MG tablet  Commonly known as:  PROTONIX  Take 1 tablet (40 mg total) by mouth daily.     pantoprazole 40 MG tablet  Commonly known as:  PROTONIX  Take 1 tablet (40 mg total) by mouth daily.     QC PEN NEEDLES 31G X 6 MM Misc  Generic drug:  Insulin Pen Needle  1 each by Other route See admin instructions. Use daily with insulin.     simvastatin 80 MG tablet  Commonly known as:  ZOCOR  Take 80 mg by mouth at bedtime.     spironolactone 12.5 mg Tabs tablet  Commonly known as:  ALDACTONE  Take 0.5 tablets (12.5 mg total) by mouth daily.     warfarin 5 MG tablet  Commonly known as:  COUMADIN    Take 1 tablet (5 mg total) by mouth daily.     warfarin 2 MG tablet  Commonly known as:  COUMADIN  Take 1 tablet (2 mg total) by mouth daily.        Disposition and follow-up:   Dennis [name] was discharged from [facility] in Stable condition.  At the hospital follow up visit please address:  [facility] without [facility] doctor's name. We are still working out his appointment with the wellness center.   1.  Need to follow INR at the coumadin clinic at [facility]. Need to contact with PCP and cardiologist to transfer care to [facility] from [facility]. Currently has appointment setup at the HF clinic.  2.  Labs / imaging needed at time of follow-up:   3.  Pending labs/ test needing follow-up:   Follow-up Appointments: Follow-up Information   Follow up with Rande Brunt, NP On 06/08/2014. (@ 9:15; [facility] located in the hospital. Please bring all your medications to your visits. Gate code [phone])    Specialty:  Nurse Practitioner   Contact information:   [address]  [place] [address] [phone]       Follow up with [facility] On 06/08/2014. (@2 :30pm spoke with [facility])    Specialty:  Cardiology   Contact information:   [address], [address] [place] [address] [phone]      Discharge Instructions: Discharge Instructions   Diet - low sodium heart healthy    Complete by:  As directed            Consultations: Treatment Team:  Sanda Klein, MD  Procedures Performed:  Dg Chest 2 View  05/27/2014   CLINICAL DATA:  Dry cough.  EXAM: CHEST  2 VIEW  COMPARISON:  05/26/2014.  FINDINGS: Cardiac silhouette is normal in size. No mediastinal or hilar masses. Stable left anterior chest wall sequential pacemaker.  Lungs are hyperexpanded. There is central vascular prominence and bilateral interstitial thickening most evident in the lung bases.  There is more confluent lung base type opacity on the lateral view posteriorly. This is stable from the previous day's study no pleural effusion. No pneumothorax.  Bony thorax is intact.  IMPRESSION: 1. Diffuse interstitial thickening with more confluent lung base opacity. This is stable from the previous day's study. There is interstitial thickening has developed since a chest radiograph dated 08/03/2011. As noted previously, this may reflect interstitial edema. Diffuse interstitial infection or inflammation is possible.   Electronically Signed   By: Lajean Manes M.D.   On: 05/27/2014 12:51   Dg Chest 2 View  05/26/2014   CLINICAL DATA:  Dry cough and shortness of breath. Left-sided chest pain.  EXAM: CHEST  2 VIEW  COMPARISON:  05/15/2014.  FINDINGS: Trachea is midline. Heart size stable. Left subclavian pacemaker lead tips are in the right atrium and right ventricle. There is new basilar predominant interstitial prominence and indistinctness with peripheral septal lines. No focal airspace consolidation. Tiny bilateral pleural effusions.  IMPRESSION: 1. Mild congestive heart failure. 2. Previously seen nodular density in the right infrahilar region is poorly appreciated.   Electronically Signed   By: Lorin Picket M.D.   On: 05/26/2014 12:30   Dg Chest 2 View  05/15/2014   CLINICAL DATA:  Chest pain, shortness of breath, history of CHF  EXAM: CHEST  2 VIEW  COMPARISON:  08/09/2013  FINDINGS: Normal heart size and vascularity. Left subclavian 2 lead pacer noted. Lungs remain clear without focal pneumonia, collapse or consolidation. No edema, pleural effusion or pneumothorax. Trachea is midline. Atherosclerosis of the aorta.  Right lower lobe 15 mm nodular density noted medially, new since the prior study which could be related to superimposed shadows versus a developing right lower lobe nodule. Recommend follow-up nonemergent chest CT.  Prior cholecystectomy noted.  IMPRESSION: Right lower lobe 15 mm  nodular density versus developing nodule warrants follow-up nonemergent chest CT  No superimposed acute chest process.  Stable postoperative findings.  These results will be called to the ordering clinician or representative by the Radiologist Assistant, and communication documented in the PACS or zVision Dashboard.   Electronically Signed   By: Daryll Brod M.D.   On: 05/15/2014 08:24    2D Echo:   05/27/2014 - Left ventricle: There appears to be noncompaction of the LV and RV. The cavity size was moderately dilated. Systolic function was severely reduced. The estimated ejection fraction was in the range of 20% to 25%. Diffuse hypokinesis. There is akinesis of the entireanteroseptal myocardium. There is akinesis of the entireapical myocardium. There is akinesis of the  entireanterior myocardium. There was mild spontaneous echo contrast, indicative of stasis. - Aortic valve: There was trivial regurgitation. - Mitral valve: There was moderate regurgitation. - Left atrium: The atrium was moderately to severely dilated. - Pulmonary arteries: PA peak pressure: 32 mm Hg (S).    Cardiac Cath:  rhc 05/30/14  RA = 2  RV = 19/2/3  PA = 22/14 (18)  PCW = 6  Fick cardiac output/index = 5.2/2.7  PVR = 2.3 WU  FA sat = 93%  PA sat = 60%, 64%  Assessment:  1. Volume depletion  2. Preserved cardiac output   Admission HPI:   53 yo male with hx of CHF - ischemic dialted cardiomyopathy , HTN, cocaine abuse, DM Type I, comes in with SOB and cough for 3 weeks. SOB worsened this morning. Having dry cough for last 3 weeks. Denies any leg swelling. Has night time orthopnea, nocturnal cough, some chills. Had some sharp chest pain lasting few secs and resolving. No longer has CP currently.  Saw cardiologits Dr. Marzetta Board at [facility] few weeks ago. Has hx of cardiomyopathy, EF 30% per patient, some questionable hx of Afib - on coumadin - currently subtherapeutic INR.  Had diarrhea 2 days ago that  resolved. Afib - on coumadin. Has defibrillator in place.  Received lasix 60mg  IV in ED, diureses ~2500 mL.   Hospital Course by problem list:  53 year old man with PMH of Left ventricular non-compaction s/p ICD placement, on coumadin, EF of 20-24% per 2D echo in 10/2010, followed at [facility], presenting with dry cough for months, increased DOE for 2 weeks, UDS+ for cocaine.   Acute on chronic systolic CHF execerbation - In the setting of cocaine use and salt indiscretion.   He has history of left ventricular non-compaction which has been worked up and treated at [facility]. Last ECHO on 10/2010 showed EF 20-24%, global hypokinesis. He is s/p ICD implant, is on coumadin. On presentation he appeared mildly volume overloaded with cough and orthopenia, weight up by 5 lb (from 153). He diuresed well with IV Lasix but had persistent orthopnea. He was noncompliant with a low sodium diet, repeat 2D echo on 05/27/14: EF 20-25%, diffuse hypokinesis, akinesis of entireapical myocardium, akinesis of entireanterior myocardium, noncompaction of the LV and RV. We have tried to adjust his lasix dose since his BP runs low. - breathing is better, no crackles, but still with prominent S3/gallop and hypotension. - heart failure team on board: [place] showed better than expected LV function. BP running 80-90's.   - F/up card recs: Lasix 20mg  M,W,F. Coreg 3.125mg  BID. Losartan 12.5mg  daily, spironolactone 12.5mg  daily - Will need outpatient testing to risk stratify for advanced therapies for HF with Dr. Tempie Hoist. - Not candidate for transplant due to substance abuse. - I/o, daily weights- CM provided scale and improtance of weighing daily. - needs to f/up at coumadin clinic (secretary will setup appt at the [facility] coumadin clinic) after d/c immediately for INR f/up. Was subtherapeutic on admission. Now therapeutic.  - has appointment at the Heart Failure clinic next week.  HTN- at home he is on coreg 6.250 BID, losartan 25mg  daily,  spironolactone 12.5mg  daily, and lisinopril 2.5mg  at home (pt had been taken of ACEi by his PCP but continued taking this med).  - stopped aceI because on losartan and because of cough.  - F/up card recs: Lasix 20mg  M,W,F. Coreg 3.125mg  BID. Losartan 12.5mg  daily, spironolactone 12.5mg  daily  Chest pain: He complained of chest pain  last night that resolved on its own, not associated with diaphoresis, or SOB. His EKG has no changes, troponin on presentation is negative. Ruled out MI. -protonix 40mg  daily  -Continue monitoring  Cough: - improved. Complaints of worse cough in the past 2 weeks. Has nasal congestion with postnasal drip. Became productive of clear/yellow sputum on presentation. Could be 2/2 to bronchitis or CHF exc. Improving. -Zpack today 5/5.  Hyperlipiedemia - - cont lipitor 40 mg daily   DM II - Uncontrolled. HgA1C of 11%. He is on Lantus 32 units qHS at home with Novolog SSI.  - continue 24 lantus nightly -Continue SSI -sensitive  Left ventricular non-compaction - on Anticoguatlion.Was taking 7mg  coumadin daily. Coumadin per pharmacy. Also on 325mg  ASA at home.  - has defib placed for LV noncompaction disease.  -We did 81mg  asa daily, concern for risk of bleed with coumadin.  - continue coumadin per pharmacy. INR goal 2-3. Needs f/up outpatient for INR checks. - Stop ASA since on coumadin  Cocaine Abuse  -discussed cocaine cessation   Tobacco abuse  -discussed cessation   Discharge Vitals:   BP 84/60  Pulse 93  Temp(Src) 98.1 F (36.7 C) (Oral)  Resp 18  Ht 6\' 1"  (1.854 m)  Wt 69.582 kg (153 lb 6.4 oz)  BMI 20.24 kg/m2  SpO2 98%  Discharge Labs:  Results for orders placed during the hospital encounter of 05/26/14 (from the past 24 hour(s))  BASIC METABOLIC PANEL     Status: Abnormal   Collection Time    05/31/14  2:30 PM      Result Value Ref Range   Sodium 138  137 - 147 mEq/L   Potassium 4.3  3.7 - 5.3 mEq/L   Chloride 99  96 - 112 mEq/L   CO2 25  19  - 32 mEq/L   Glucose, Bld 260 (*) 70 - 99 mg/dL   BUN 23  6 - 23 mg/dL   Creatinine, Ser 1.12  0.50 - 1.35 mg/dL   Calcium 9.1  8.4 - 10.5 mg/dL   GFR calc non Af Amer 74 (*) >90 mL/min   GFR calc Af Amer 86 (*) >90 mL/min   Anion gap 14  5 - 15  GLUCOSE, CAPILLARY     Status: Abnormal   Collection Time    05/31/14  4:41 PM      Result Value Ref Range   Glucose-Capillary 177 (*) 70 - 99 mg/dL   Comment 1 Documented in Chart     Comment 2 Notify RN    GLUCOSE, CAPILLARY     Status: Abnormal   Collection Time    05/31/14  8:00 PM      Result Value Ref Range   Glucose-Capillary 207 (*) 70 - 99 mg/dL   Comment 1 Notify RN     Comment 2 Documented in Chart    GLUCOSE, CAPILLARY     Status: Abnormal   Collection Time    06/01/14 12:41 AM      Result Value Ref Range   Glucose-Capillary 187 (*) 70 - 99 mg/dL  GLUCOSE, CAPILLARY     Status: Abnormal   Collection Time    06/01/14  2:34 AM      Result Value Ref Range   Glucose-Capillary 182 (*) 70 - 99 mg/dL   Comment 1 Documented in Chart     Comment 2 Notify RN    GLUCOSE, CAPILLARY     Status: Abnormal   Collection Time  06/01/14  4:38 AM      Result Value Ref Range   Glucose-Capillary 211 (*) 70 - 99 mg/dL  PROTIME-INR     Status: Abnormal   Collection Time    06/01/14  5:00 AM      Result Value Ref Range   Prothrombin Time 30.6 (*) 11.6 - 15.2 seconds   INR 2.93 (*) 0.00 - 2.72  BASIC METABOLIC PANEL     Status: Abnormal   Collection Time    06/01/14  5:00 AM      Result Value Ref Range   Sodium 138  137 - 147 mEq/L   Potassium 4.2  3.7 - 5.3 mEq/L   Chloride 103  96 - 112 mEq/L   CO2 25  19 - 32 mEq/L   Glucose, Bld 190 (*) 70 - 99 mg/dL   BUN 19  6 - 23 mg/dL   Creatinine, Ser 1.07  0.50 - 1.35 mg/dL   Calcium 9.0  8.4 - 10.5 mg/dL   GFR calc non Af Amer 78 (*) >90 mL/min   GFR calc Af Amer >90  >90 mL/min   Anion gap 10  5 - 15  GLUCOSE, CAPILLARY     Status: Abnormal   Collection Time    06/01/14  8:16 AM       Result Value Ref Range   Glucose-Capillary 143 (*) 70 - 99 mg/dL  GLUCOSE, CAPILLARY     Status: Abnormal   Collection Time    06/01/14 11:06 AM      Result Value Ref Range   Glucose-Capillary 222 (*) 70 - 99 mg/dL  GLUCOSE, CAPILLARY     Status: Abnormal   Collection Time    06/01/14 11:39 AM      Result Value Ref Range   Glucose-Capillary 225 (*) 70 - 99 mg/dL    Signed: Dellia Nims, MD 06/01/2014, 2:17 PM    Services Ordered on Discharge:  Equipment Ordered on Discharge:

## 2014-05-29 NOTE — Consult Note (Signed)
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   Was diagnosed with HF in 2009 at [facility] with Dr. Damaris Schooner. Did very well since that time with NYHA II symptoms without diuretics. A few weeks ago saw Dr. Erline Levine with progressive HF symptoms. Prescribed lasix but he didn't fill it. Admitted here for ADHF.   ECHO EF 20-25%. RV ok.   He was diuresed with some improvement in his symptoms but remains NYHA IIIB-IV with dyspnea on minimal exertion. Lasix had to be held due to hypotension.   Lives with his 66 y/o parents. Has co-custody of his 23-yo daughter.   Smokes about 1/2 ppd. Recreational cocaine x 1.5 year.   Review of Systems: [y] = yes, [ ]  = no   General: Weight gain [ ] ; Weight loss [ ] ; Anorexia [ ] ; Fatigue Blue.Reese ]; Fever [ ] ; Chills [ ] ; Weakness [ y]  Cardiac: Chest pain/pressure [ ] ; Resting SOB [ y]; Exertional SOB [ y]; Orthopnea [ y]; Pedal Edema [ ] ; Palpitations [ ] ; Syncope [ ] ; Presyncope [ ] ; Paroxysmal nocturnal dyspnea[ ]   Pulmonary: Cough Blue.Reese ]; Wheezing[ ] ; Hemoptysis[ ] ; Sputum [ ] ; Snoring [ ]   GI: Vomiting[ ] ; Dysphagia[ ] ; Melena[ ] ; Hematochezia [ ] ; Heartburn[ ] ; Abdominal pain [ ] ; Constipation [ ] ; Diarrhea [ ] ; BRBPR [ ]   GU: Hematuria[ ] ; Dysuria [ ] ; Nocturia[ ]   Vascular: Pain in legs with walking [ ] ; Pain in feet with lying flat [ ] ; Non-healing sores [ ] ; Stroke [ ] ; TIA [ ] ; Slurred speech [ ] ;  Neuro: Headaches[ ] ; Vertigo[ ] ; Seizures[ ] ; Paresthesias[ ] ;Blurred vision [ ] ; Diplopia [ ] ; Vision changes [ ]   Ortho/Skin: Arthritis [ ] ; Joint pain [ ] ; Muscle pain [ ] ; Joint swelling [ ] ; Back Pain [ ] ; Rash [ ]   Psych: Depression[ ] ; Anxiety[ ]   Heme: Bleeding problems [ ] ; Clotting disorders [ ] ; Anemia [ ]   Endocrine: Diabetes [ y]; Thyroid dysfunction[  ]  Home Medications Prior to Admission medications   Medication Sig Start Date End Date Taking? Authorizing Provider  aspirin 325 MG tablet Take 325 mg by mouth daily.   Yes Historical Provider, MD  carvedilol (COREG) 3.125 MG tablet Take 3.125 mg by mouth 2 (two) times daily with a meal.   Yes Historical Provider, MD  insulin glargine (LANTUS) 100 UNIT/ML injection Inject 32 Units into the skin at bedtime.   Yes Historical Provider, MD  lisinopril (PRINIVIL,ZESTRIL) 2.5 MG tablet Take 2.5 mg by mouth daily.   Yes Historical Provider, MD  losartan (COZAAR) 25 MG tablet Take 25 mg by mouth daily. 05/16/14 05/16/15 Yes Historical Provider, MD  QC PEN NEEDLES 31G X 6 MM MISC 1 each by Other route See admin instructions. Use daily with insulin. 08/06/12  Yes Historical Provider, MD  simvastatin (ZOCOR) 80 MG tablet Take 80 mg by mouth at bedtime.   Yes Historical Provider, MD  spironolactone (ALDACTONE) 25 MG tablet Take 12.5 mg by mouth daily.    Yes Historical Provider, MD  warfarin (COUMADIN) 2 MG tablet Take 2 mg by mouth daily. 2 mg in addition to 5 mg   Yes Historical Provider, MD  warfarin (COUMADIN) 5 MG tablet Take 5 mg by mouth daily. 5 mg in addition to 2 mg  Yes Historical Provider, MD    Past Medical History: Past Medical History  Diagnosis Date  . CHF (congestive heart failure)   . Drug abuse and dependence   . Automatic implantable cardioverter-defibrillator in situ   . Heart murmur   . Exertional shortness of breath   . Type I diabetes mellitus   . Pneumonia ~ 1988  . Sleep apnea     "cleared after T&A"  . Cardiomyopathy, ischemic 07/21/2011  . BP (high blood pressure) 03/04/2013  . Left ventricular noncompaction 05/27/2014  . Acute on chronic systolic heart failure     Past Surgical History: Past Surgical History  Procedure Laterality Date  . Forearm fracture surgery Left 1980  . Cardiac defibrillator placement  03/2011  . Cholecystectomy  1994  . Tonsillectomy and  adenoidectomy  ~ 1998    Family History: Family History  Problem Relation Age of Onset  . Diabetes Mother   . Heart disease Mother   . Diabetes Father   . Prostate cancer Father   . Heart disease Father   . Diabetes Brother   . Diabetes Brother     Social History: History   Social History  . Marital Status: Single    Spouse Name: N/A    Number of Children: 1  . Years of Education: N/A   Occupational History  . Disabled    Social History Main Topics  . Smoking status: Former Smoker -- 0.50 packs/day for 35 years    Types: Cigarettes    Quit date: 05/24/2014  . Smokeless tobacco: Never Used  . Alcohol Use: Yes     Comment: 05/26/2014 "might have a drink maybe once/month, if that"  . Drug Use: Yes    Special: Cocaine     Comment: 05/26/2014  "last cocaine was ~ 5 days ago"  . Sexual Activity: Yes   Other Topics Concern  . None   Social History Narrative  . None    Allergies:  No Known Allergies  Objective:    Vital Signs:   Temp:  [98 F (36.7 C)-99.1 F (37.3 C)] 98 F (36.7 C) (08/17 1500) Pulse Rate:  [99-108] 99 (08/17 1500) Resp:  [19-20] 20 (08/17 1500) BP: (87-104)/(62-72) 99/62 mmHg (08/17 1500) SpO2:  [89 %-99 %] 97 % (08/17 1500) Weight:  [71.4 kg (157 lb 6.5 oz)] 71.4 kg (157 lb 6.5 oz) (08/17 0518) Last BM Date: 05/25/14  Weight change: Filed Weights   05/27/14 0426 05/28/14 0500 05/29/14 0518  Weight: 71.6 kg (157 lb 13.6 oz) 71.5 kg (157 lb 10.1 oz) 71.4 kg (157 lb 6.5 oz)    Intake/Output:   Intake/Output Summary (Last 24 hours) at 05/29/14 1825 Last data filed at 05/29/14 1500  Gross per 24 hour  Intake    600 ml  Output   1700 ml  Net  -1100 ml     Physical Exam: General:  Fatigued appearing. Hacking cough  HEENT: normal Neck: supple. JVP 10. Carotids 2+ bilat; no bruits. No lymphadenopathy or thryomegaly appreciated. Cor: PMI laterally displaced. Regular rate & rhythm. Loud s3 Lungs: basilar crackles  Abdomen: soft,  nontender, + distended. No hepatosplenomegaly. No bruits or masses. Good bowel sounds. Extremities: no cyanosis, clubbing, rash, edema Neuro: alert & orientedx3, cranial nerves grossly intact. moves all 4 extremities w/o difficulty. Affect pleasant  Telemetry:  NSR   Labs: Basic Metabolic Panel:  Recent Labs Lab 05/26/14 1138 05/27/14 0142 05/28/14 1225  NA 141 137 136*  K 4.1 4.3 4.9  CL 105 98 99  CO2 25 30 29   GLUCOSE 232* 414* 304*  BUN 8 13 18   CREATININE 0.83 0.92 0.94  CALCIUM 8.8 8.7 9.0    Liver Function Tests: No results found for this basename: AST, ALT, ALKPHOS, BILITOT, PROT, ALBUMIN,  in the last 168 hours No results found for this basename: LIPASE, AMYLASE,  in the last 168 hours No results found for this basename: AMMONIA,  in the last 168 hours  CBC:  Recent Labs Lab 05/26/14 1138 05/27/14 0142  WBC 9.2 8.1  HGB 11.1* 11.6*  HCT 34.9* 36.0*  MCV 99.7 97.3  PLT 261 276    Cardiac Enzymes:  Recent Labs Lab 05/26/14 1138 05/26/14 2017 05/27/14 0142 05/28/14 0951  TROPONINI <0.30 <0.30 <0.30 <0.30    BNP: BNP (last 3 results)  Recent Labs  05/15/14 0805 05/26/14 1138  PROBNP 1920.0* 1034.0*    CBG:  Recent Labs Lab 05/28/14 1110 05/28/14 1659 05/29/14 0627 05/29/14 1152 05/29/14 1543  GLUCAP 277* 285* 173* 255* 157*    Coagulation Studies:  Recent Labs  05/27/14 0142 05/28/14 0508 05/29/14 0639  LABPROT 17.1* 15.0 24.8*  INR 1.39 1.18 2.24*    Other results: EKG: NSR 98 LVH with repol. Narrow QRS  Imaging:  No results found.   Medications:     Current Medications: . aspirin EC  81 mg Oral Daily  . atorvastatin  40 mg Oral q1800  . azithromycin  250 mg Oral Daily  . carvedilol  6.25 mg Oral BID WC  . furosemide  20 mg Oral Daily  . insulin aspart  0-9 Units Subcutaneous TID WC  . insulin glargine  32 Units Subcutaneous QHS  . losartan  25 mg Oral Daily  . pantoprazole  40 mg Oral Daily  . sodium  chloride  3 mL Intravenous Q12H  . sodium chloride  3 mL Intravenous Q12H  . spironolactone  12.5 mg Oral Daily  . Warfarin - Pharmacist Dosing Inpatient   Does not apply q1800     Infusions:      Assessment:   1) Acute on chronic systolic HF with low output physiology 2) Ventricular noncompaction 3) Mixed ischemic/nonischemic CM    --with collateralization of LAD 4) Tobacco use, ongoing 5) Cocaine use 6) DM2  Plan/Discussion:     He has advanced HF symptoms suggestive of cardiogenic shock physiology. On exam he is volume overloaded and has very prominent s3. He will likely need advanced therapies. He is not candidate for transplant due to substance abuse but may be candidate for IV milrinone or VAD. Will plan RHC in am to further evaluate (ideally would do left as well but INR too high).   Give additional IV lasix tonight.   The HF team will follow.   Total time spent 45 minutes. Over 80% of that time spent discussing above.   Length of Stay: 3  Glori Bickers MD 05/29/2014, 6:25 PM  Advanced Heart Failure Team Pager [phone] (M-F; 7a - 4p)  Please contact [facility] for night-coverage after hours (4p -7a ) and weekends on [email or web address]

## 2014-05-29 NOTE — Progress Notes (Signed)
Patient Name: Dennis [name] Date of Encounter: 05/29/2014     Principal Problem:   SOB Active Problems:   DIABETES MELLITUS, TYPE II   TOBACCO ABUSE   Drug abuse and dependence   CHF (congestive heart failure)   Long term (current) use of anticoagulants   Dyspnea   Left ventricular noncompaction    SUBJECTIVE  Patient still with SOB, orthopnea and PND. No peripheral edema.   CURRENT MEDS . aspirin EC  81 mg Oral Daily  . atorvastatin  40 mg Oral q1800  . azithromycin  250 mg Oral Daily  . carvedilol  6.25 mg Oral BID WC  . insulin aspart  0-9 Units Subcutaneous TID WC  . insulin glargine  32 Units Subcutaneous QHS  . losartan  25 mg Oral Daily  . pantoprazole  40 mg Oral Daily  . sodium chloride  3 mL Intravenous Q12H  . sodium chloride  3 mL Intravenous Q12H  . spironolactone  12.5 mg Oral Daily  . warfarin  10 mg Oral ONCE-1800  . Warfarin - Pharmacist Dosing Inpatient   Does not apply q1800    OBJECTIVE  Filed Vitals:   05/28/14 2119 05/29/14 0206 05/29/14 0208 05/29/14 0518  BP: 87/68 88/68 90/70  104/72  Pulse: 102 103 106 108  Temp: 99.1 F (37.3 C) 98.4 F (36.9 C)  98.2 F (36.8 C)  TempSrc:      Resp: 20 20 19 20   Height:      Weight:    157 lb 6.5 oz (71.4 kg)  SpO2: 94% 89% 91% 99%    Intake/Output Summary (Last 24 hours) at 05/29/14 1015 Last data filed at 05/29/14 0526  Gross per 24 hour  Intake    480 ml  Output    675 ml  Net   -195 ml   Filed Weights   05/27/14 0426 05/28/14 0500 05/29/14 0518  Weight: 157 lb 13.6 oz (71.6 kg) 157 lb 10.1 oz (71.5 kg) 157 lb 6.5 oz (71.4 kg)    PHYSICAL EXAM  General: Pleasant, NAD. Neuro: Alert and oriented X 3. Moves all extremities spontaneously. Psych: Normal affect. HEENT:  Normal  Neck: Supple without bruits or JVD. Lungs:  Resp regular and unlabored, Crackles at bases Heart: +loud S3, faint holosystolic apical murmur Abdomen: Soft, non-tender, non-distended, BS + x 4.  Extremities: No  clubbing, cyanosis or edema. DP/PT/Radials 2+ and equal bilaterally.  Accessory Clinical Findings  CBC  Recent Labs  05/26/14 1138 05/27/14 0142  WBC 9.2 8.1  HGB 11.1* 11.6*  HCT 34.9* 36.0*  MCV 99.7 97.3  PLT 261 629   Basic Metabolic Panel  Recent Labs  05/27/14 0142 05/28/14 1225  NA 137 136*  K 4.3 4.9  CL 98 99  CO2 30 29  GLUCOSE 414* 304*  BUN 13 18  CREATININE 0.92 0.94  CALCIUM 8.7 9.0    Cardiac Enzymes  Recent Labs  05/26/14 2017 05/27/14 0142 05/28/14 0951  TROPONINI <0.30 <0.30 <0.30    D-Dimer  Recent Labs  05/26/14 1138  DDIMER <0.27   Hemoglobin A1C  Recent Labs  05/26/14 2017  HGBA1C 11.0*    Thyroid Function Tests  Recent Labs  05/26/14 2017  TSH 0.934    TELE  Sinus with 1st deg AV block  Radiology/Studies  Dg Chest 2 View  05/27/2014   CLINICAL DATA:  Dry cough.  EXAM: CHEST  2 VIEW  COMPARISON:  05/26/2014.  FINDINGS: Cardiac silhouette is normal in size. No  mediastinal or hilar masses. Stable left anterior chest wall sequential pacemaker.  Lungs are hyperexpanded. There is central vascular prominence and bilateral interstitial thickening most evident in the lung bases. There is more confluent lung base type opacity on the lateral view posteriorly. This is stable from the previous day's study no pleural effusion. No pneumothorax.  Bony thorax is intact.  IMPRESSION: 1. Diffuse interstitial thickening with more confluent lung base opacity. This is stable from the previous day's study. There is interstitial thickening has developed since a chest radiograph dated 08/03/2011. As noted previously, this may reflect interstitial edema. Diffuse interstitial infection or inflammation is possible.   Electronically Signed   By: Lajean Manes M.D.   On: 05/27/2014 12:51   Dg Chest 2 View  05/26/2014   CLINICAL DATA:  Dry cough and shortness of breath. Left-sided chest pain.  EXAM: CHEST  2 VIEW  COMPARISON:  05/15/2014.  FINDINGS:  Trachea is midline. Heart size stable. Left subclavian pacemaker lead tips are in the right atrium and right ventricle. There is new basilar predominant interstitial prominence and indistinctness with peripheral septal lines. No focal airspace consolidation. Tiny bilateral pleural effusions.  IMPRESSION: 1. Mild congestive heart failure. 2. Previously seen nodular density in the right infrahilar region is poorly appreciated.   Electronically Signed   By: Lorin Picket M.D.   On: 05/26/2014 12:30   Dg Chest 2 View  05/15/2014   CLINICAL DATA:  Chest pain, shortness of breath, history of CHF  EXAM: CHEST  2 VIEW  COMPARISON:  08/09/2013  FINDINGS: Normal heart size and vascularity. Left subclavian 2 lead pacer noted. Lungs remain clear without focal pneumonia, collapse or consolidation. No edema, pleural effusion or pneumothorax. Trachea is midline. Atherosclerosis of the aorta.  Right lower lobe 15 mm nodular density noted medially, new since the prior study which could be related to superimposed shadows versus a developing right lower lobe nodule. Recommend follow-up nonemergent chest CT.  Prior cholecystectomy noted.  IMPRESSION: Right lower lobe 15 mm nodular density versus developing nodule warrants follow-up nonemergent chest CT  No superimposed acute chest process.  Stable postoperative findings.  These results will be called to the ordering clinician or representative by the Radiologist Assistant, and communication documented in the PACS or zVision Dashboard.   Electronically Signed   By: Daryll Brod M.D.   On: 05/15/2014 08:24     2D ECHO: 05/27/2014 LV EF: 20% - 25% Study Conclusions - Left ventricle: There appears to be noncompaction of the LV and RV. The cavity size was moderately dilated. Systolic function was severely reduced. The estimated ejection fraction was in the range of 20% to 25%. Diffuse hypokinesis. There is akinesis of the entireanteroseptal myocardium. There is akinesis of  the entireapical myocardium. There is akinesis of the entireanterior myocardium. There was mild spontaneous echo contrast, indicative of stasis. - Aortic valve: There was trivial regurgitation. - Mitral valve: There was moderate regurgitation. - Left atrium: The atrium was moderately to severely dilated. - Pulmonary arteries: PA peak pressure: 32 mm Hg (S).   ASSESSMENT AND PLAN  Dennis [name] is a 53 y.o. male with a history of left ventricular non-compaction on coumadin, poorly controlled DM, HTN, OSA, drug abuse (+ cocaine on UDS), NICM ( EF 10%--> 20-25%(echo 05/27/14)) s/p AICD who presented to [facility] with acute on chronic systolic CHF and bronchitis.  Acute on chronic systolic CHF- In the setting of cocaine use and salt indiscretion  -- 2D ECHO: 05/27/2014; EF 20-25%; noncompaction  of the LV and RV. LV moderately dilated. Diffuse hypokinesis and akinesis of the entire anteroseptal myocardium. Mild spontaneous echo contrast, indicative of stasis. Mod MR. Severe LA dilation. PA pressure 32  -- Continue home coreg 6.250 BID, losartan 25mg  daily, spironolactone 12.5mg  daily.  -- Net neg 3.9 L after IV Lasix and PO Lasix. No longer on Lasix due to hypotension, but will likely need this resumed as he is still SOB and has crackles on exam. MD to see.    Left ventricular non-compaction - on anticoagulation. Now therapeutic at 2.24.  -- Has defib placed for LV noncompaction disease.   HTN- He is on coreg 6.250 BID, losartan 25mg  daily, spironolactone 12.5mg  daily (restarted), and lisinopril 2.5mg  at home (pt had been taken of ACEi by his PCP but continued taking this med). BPs had been soft yesterday and spironolactone and Losartan held. Now resumed. BP still soft but stable -- Continue Coreg 6.25mg  BID -- Lasix held in the setting of soft pressures  Chest pain: He complained of chest pain last night that resolved on its own, not associated with diaphoresis, or SOB. -- No objective signs of  ischemia. Ruled out for MI.  Cough: Complaints of worse cough in the past 2 weeks.  -- On  Zpack for possible bronchitis with close INR monitoring    Hyperlipidemia - cont statin  DM II - Uncontrolled: HgA1C of 11%.   Tobacco abuse /Cocaine Abuse  -- Discussed cessation   Dispo- Will arrange F/U in [place] with Dr. Sallyanne Kuster as he has difficulty getting to [name] for f/u. Patient has requested this.   Tyrell Antonio PA-C  Pager [phone]  I have examined the patient and reviewed assessment and plan and discussed with patient.  Agree with above as stated.  BP borderline after significant diuresis. Restarting low dose Lasix today.  He is still feeling poorly, but better than when he came to the hospital.  I am conerned that he may be having low output symptoms.  Will get heart failure team to evaluate.  He may need right heart cath as well.   He will need to avoid illegal drugs as well.  VARANASI,JAYADEEP S.

## 2014-05-29 NOTE — Progress Notes (Signed)
05/29/2014 1401  Date Additional Medicare IM given: 05/29/2014 Additional Medicare IM given by:  Jonnie Finner Jonnie Finner RN CCM Case Mgmt phone [phone]

## 2014-05-30 ENCOUNTER — Encounter (HOSPITAL_COMMUNITY)
Admission: EM | Disposition: A | Discharge status: Home / Self Care | Payer: Self-pay | Source: Home / Self Care | Attending: Internal Medicine

## 2014-05-30 DIAGNOSIS — J40 Bronchitis, not specified as acute or chronic: Secondary | ICD-10-CM | POA: Diagnosis present

## 2014-05-30 DIAGNOSIS — F17200 Nicotine dependence, unspecified, uncomplicated: Secondary | ICD-10-CM | POA: Diagnosis present

## 2014-05-30 DIAGNOSIS — I4891 Unspecified atrial fibrillation: Secondary | ICD-10-CM | POA: Diagnosis present

## 2014-05-30 DIAGNOSIS — E785 Hyperlipidemia, unspecified: Secondary | ICD-10-CM | POA: Diagnosis present

## 2014-05-30 DIAGNOSIS — Z7901 Long term (current) use of anticoagulants: Secondary | ICD-10-CM | POA: Diagnosis not present

## 2014-05-30 DIAGNOSIS — I5023 Acute on chronic systolic (congestive) heart failure: Secondary | ICD-10-CM | POA: Diagnosis present

## 2014-05-30 DIAGNOSIS — F1410 Cocaine abuse, uncomplicated: Secondary | ICD-10-CM | POA: Diagnosis present

## 2014-05-30 DIAGNOSIS — I10 Essential (primary) hypertension: Secondary | ICD-10-CM | POA: Diagnosis present

## 2014-05-30 DIAGNOSIS — Z95810 Presence of automatic (implantable) cardiac defibrillator: Secondary | ICD-10-CM | POA: Diagnosis not present

## 2014-05-30 DIAGNOSIS — I959 Hypotension, unspecified: Secondary | ICD-10-CM | POA: Diagnosis not present

## 2014-05-30 DIAGNOSIS — I2589 Other forms of chronic ischemic heart disease: Secondary | ICD-10-CM | POA: Diagnosis present

## 2014-05-30 DIAGNOSIS — G4733 Obstructive sleep apnea (adult) (pediatric): Secondary | ICD-10-CM | POA: Diagnosis present

## 2014-05-30 DIAGNOSIS — Z794 Long term (current) use of insulin: Secondary | ICD-10-CM | POA: Diagnosis not present

## 2014-05-30 DIAGNOSIS — E1165 Type 2 diabetes mellitus with hyperglycemia: Secondary | ICD-10-CM | POA: Diagnosis present

## 2014-05-30 DIAGNOSIS — Z7982 Long term (current) use of aspirin: Secondary | ICD-10-CM | POA: Diagnosis not present

## 2014-05-30 DIAGNOSIS — Z91199 Patient's noncompliance with other medical treatment and regimen due to unspecified reason: Secondary | ICD-10-CM | POA: Diagnosis not present

## 2014-05-30 DIAGNOSIS — Z9119 Patient's noncompliance with other medical treatment and regimen: Secondary | ICD-10-CM | POA: Diagnosis not present

## 2014-05-30 DIAGNOSIS — I509 Heart failure, unspecified: Secondary | ICD-10-CM | POA: Diagnosis present

## 2014-05-30 DIAGNOSIS — IMO0002 Reserved for concepts with insufficient information to code with codable children: Secondary | ICD-10-CM | POA: Diagnosis present

## 2014-05-30 DIAGNOSIS — Z79899 Other long term (current) drug therapy: Secondary | ICD-10-CM | POA: Diagnosis not present

## 2014-05-30 HISTORY — PX: RIGHT HEART CATHETERIZATION: SHX5447

## 2014-05-30 LAB — GLUCOSE, CAPILLARY
Glucose-Capillary: 252 mg/dL — ABNORMAL HIGH (ref 70–99)
Glucose-Capillary: 69 mg/dL — ABNORMAL LOW (ref 70–99)
Glucose-Capillary: 96 mg/dL (ref 70–99)
Glucose-Capillary: 141 mg/dL — ABNORMAL HIGH (ref 70–99)
Glucose-Capillary: 56 mg/dL — ABNORMAL LOW (ref 70–99)
Glucose-Capillary: 63 mg/dL — ABNORMAL LOW (ref 70–99)
Glucose-Capillary: 76 mg/dL (ref 70–99)
Glucose-Capillary: 299 mg/dL — ABNORMAL HIGH (ref 70–99)
Glucose-Capillary: 252 mg/dL — ABNORMAL HIGH (ref 70–99)

## 2014-05-30 LAB — BASIC METABOLIC PANEL
Anion gap: 12 (ref 5–15)
BUN: 18 mg/dL (ref 6–23)
CO2: 30 mEq/L (ref 19–32)
Calcium: 9.6 mg/dL (ref 8.4–10.5)
Chloride: 100 mEq/L (ref 96–112)
Creatinine, Ser: 1.04 mg/dL (ref 0.50–1.35)
GFR calc Af Amer: >90 mL/min (ref >90)
GFR calc non Af Amer: 81 mL/min — ABNORMAL LOW (ref >90)
Glucose, Bld: 138 mg/dL — ABNORMAL HIGH (ref 70–99)
Potassium: 3.7 mEq/L (ref 3.7–5.3)
Sodium: 142 mEq/L (ref 137–147)

## 2014-05-30 LAB — PROTIME-INR
INR: 2.57 — ABNORMAL HIGH (ref 0.00–1.49)
Prothrombin Time: 27.6 seconds — ABNORMAL HIGH (ref 11.6–15.2)

## 2014-05-30 LAB — POCT I-STAT 3, VENOUS BLOOD GAS (G3P V)
Acid-Base Excess: 4 mmol/L — ABNORMAL HIGH (ref 0.0–2.0)
Acid-Base Excess: 5 mmol/L — ABNORMAL HIGH (ref 0.0–2.0)
Bicarbonate: 28.4 mEq/L — ABNORMAL HIGH (ref 20.0–24.0)
Bicarbonate: 30.5 mEq/L — ABNORMAL HIGH (ref 20.0–24.0)
O2 Saturation: 60 %
O2 Saturation: 64 %
TCO2: 30 mmol/L (ref 0–100)
TCO2: 32 mmol/L (ref 0–100)
pCO2, Ven: 42.5 mmHg — ABNORMAL LOW (ref 45.0–50.0)
pCO2, Ven: 45.8 mmHg (ref 45.0–50.0)
pH, Ven: 7.432 — ABNORMAL HIGH (ref 7.250–7.300)
pH, Ven: 7.433 — ABNORMAL HIGH (ref 7.250–7.300)
pO2, Ven: 31 mmHg (ref 30.0–45.0)
pO2, Ven: 32 mmHg (ref 30.0–45.0)

## 2014-05-30 SURGERY — RIGHT HEART CATH
Anesthesia: LOCAL

## 2014-05-30 MED ORDER — HEPARIN (PORCINE) IN NACL 2-0.9 UNIT/ML-% IJ SOLN
INTRAMUSCULAR | Status: AC
  Filled 2014-05-30: qty 500

## 2014-05-30 MED ORDER — SODIUM CHLORIDE 0.9 % IV BOLUS (SEPSIS)
250.0000 mL | Freq: Once | INTRAVENOUS | Status: AC | PRN
  Administered 2014-05-30: 250 mL via INTRAVENOUS

## 2014-05-30 MED ORDER — ACETAMINOPHEN 325 MG PO TABS: 650.0000 mg | ORAL_TABLET | ORAL | Status: DC | PRN

## 2014-05-30 MED ORDER — MIDAZOLAM HCL 2 MG/2ML IJ SOLN
INTRAMUSCULAR | Status: AC
Start: 2014-05-30 — End: 2014-05-30
  Filled 2014-05-30: qty 2

## 2014-05-30 MED ORDER — WARFARIN SODIUM 7.5 MG PO TABS
7.5000 mg | ORAL_TABLET | Freq: Once | ORAL | Status: AC
  Administered 2014-05-30: 7.5 mg via ORAL
  Filled 2014-05-30 (×2): qty 1

## 2014-05-30 MED ORDER — LIDOCAINE HCL (PF) 1 % IJ SOLN
INTRAMUSCULAR | Status: AC
  Filled 2014-05-30: qty 30

## 2014-05-30 MED ORDER — INSULIN ASPART 100 UNIT/ML ~~LOC~~ SOLN
0.0000 [IU] | SUBCUTANEOUS | Status: DC
  Administered 2014-05-30: 5 [IU] via SUBCUTANEOUS
  Administered 2014-05-31: 3 [IU] via SUBCUTANEOUS
  Administered 2014-05-31: 2 [IU] via SUBCUTANEOUS
  Administered 2014-05-31 – 2014-06-01 (×3): 3 [IU] via SUBCUTANEOUS
  Administered 2014-06-01: 1 [IU] via SUBCUTANEOUS
  Administered 2014-06-01: 2 [IU] via SUBCUTANEOUS
  Administered 2014-06-01: 3 [IU] via SUBCUTANEOUS

## 2014-05-30 MED ORDER — GLUCOSE 40 % PO GEL
ORAL | Status: AC
  Administered 2014-05-30: 1
  Filled 2014-05-30: qty 1

## 2014-05-30 MED ORDER — FUROSEMIDE 40 MG PO TABS
40.0000 mg | ORAL_TABLET | Freq: Every day | ORAL | Status: DC
  Administered 2014-05-31: 40 mg via ORAL
  Filled 2014-05-30: qty 1

## 2014-05-30 MED ORDER — ONDANSETRON HCL 4 MG/2ML IJ SOLN: 4.0000 mg | Freq: Four times a day (QID) | INTRAMUSCULAR | Status: DC | PRN

## 2014-05-30 MED ORDER — GLUCOSE 40 % PO GEL: 1.0000 | ORAL | Status: DC | PRN

## 2014-05-30 MED ORDER — GLUCOSE 40 % PO GEL
ORAL | Status: AC
  Administered 2014-05-30: 11:00:00
  Filled 2014-05-30: qty 1

## 2014-05-30 MED ORDER — SODIUM CHLORIDE 0.9 % IV SOLN
INTRAVENOUS | Status: AC
  Administered 2014-05-30: 15:00:00 via INTRAVENOUS

## 2014-05-30 MED ORDER — INSULIN GLARGINE 100 UNIT/ML ~~LOC~~ SOLN
24.0000 [IU] | Freq: Every day | SUBCUTANEOUS | Status: DC
  Administered 2014-05-30 – 2014-05-31 (×2): 24 [IU] via SUBCUTANEOUS
  Filled 2014-05-30 (×3): qty 0.24

## 2014-05-30 MED ORDER — DEXTROSE 50 % IV SOLN
25.0000 mL | Freq: Once | INTRAVENOUS | Status: AC | PRN
  Administered 2014-05-30: 25 mL via INTRAVENOUS
  Filled 2014-05-30 (×2): qty 50

## 2014-05-30 MED ORDER — FENTANYL CITRATE 0.05 MG/ML IJ SOLN
INTRAMUSCULAR | Status: AC
  Filled 2014-05-30: qty 2

## 2014-05-30 NOTE — Progress Notes (Signed)
Heart Failure Navigator Consult Note  Presentation: Dennis [name] is a 53 year old man with history of ischemic cardiomyopathy managed at [facility], S/P ICD, hypertension, type 2 diabetes mellitus, cocaine abuse, and other problems as outlined in the medical history admitted with complaint of persisting cough for the past 3 weeks with worsening shortness of breath for 3 days prior to admission. He reports occasional transient episodes of chest pain about 2 days ago, described as sharp and lasting only a second or two; he denies any persisting chest pain. He is on warfarin due to a history of left ventricular compaction, but says it has been several months since he was seen in the anticoagulation clinic at [facility]. He reports that he occasionally uses cocaine, and last snorted cocaine about 5 days prior to admission. He denies any lower extremity edema.   Past Medical History  Diagnosis Date  . CHF (congestive heart failure)   . Drug abuse and dependence   . Automatic implantable cardioverter-defibrillator in situ   . Heart murmur   . Exertional shortness of breath   . Type I diabetes mellitus   . Pneumonia ~ 1988  . Sleep apnea     "cleared after T&A"  . Cardiomyopathy, ischemic 07/21/2011  . BP (high blood pressure) 03/04/2013  . Left ventricular noncompaction 05/27/2014  . Acute on chronic systolic heart failure     History   Social History  . Marital Status: Single    Spouse Name: N/A    Number of Children: 1  . Years of Education: N/A   Occupational History  . Disabled    Social History Main Topics  . Smoking status: Former Smoker -- 0.50 packs/day for 35 years    Types: Cigarettes    Quit date: 05/24/2014  . Smokeless tobacco: Never Used  . Alcohol Use: Yes     Comment: 05/26/2014 "might have a drink maybe once/month, if that"  . Drug Use: Yes    Special: Cocaine     Comment: 05/26/2014  "last cocaine was ~ 5 days ago"  . Sexual Activity: Yes    Other Topics Concern  . None   Social History Narrative  . None    ECHO:Study Conclusions--05/27/14  - Left ventricle: There appears to be noncompaction of the LV and RV. The cavity size was moderately dilated. Systolic function was severely reduced. The estimated ejection fraction was in the range of 20% to 25%. Diffuse hypokinesis. There is akinesis of the entireanteroseptal myocardium. There is akinesis of the entireapical myocardium. There is akinesis of the entireanterior myocardium. There was mild spontaneous echo contrast, indicative of stasis. - Aortic valve: There was trivial regurgitation. - Mitral valve: There was moderate regurgitation. - Left atrium: The atrium was moderately to severely dilated. - Pulmonary arteries: PA peak pressure: 32 mm Hg (S).  Transthoracic echocardiography. M-mode, complete 2D, spectral Doppler, and color Doppler. Birthdate: Patient birthdate: April 13, 1961. Age: Patient is 53 yr old. Sex: Gender: male. BMI: 20.7 kg/m^2. Blood pressure: 98/71 Patient status: Inpatient. Study date: Study date: 05/27/2014. Study time: 10:34 AM. Location: Bedside.   BNP    Component Value Date/Time   PROBNP 1034.0* 05/26/2014 1138    Education Assessment and Provision:  Detailed education and instructions provided on heart failure disease management including the following:  Signs and symptoms of Heart Failure When to call the physician Importance of daily weights Low sodium diet Fluid restriction Medication management Anticipated future follow-up appointments  Patient education given on each of the  above topics.  Patient acknowledges understanding and acceptance of all instructions.  I spoke briefly with Dennis [name] regarding his HF.  He was hypoglycemic and therefore we ended our conversation.  I will return after cardiac cath to discuss with him further the above topics.  He does say that he was previously living in [place] and was receiving HF care  from Dr. Erline Levine.  He now lives in [place] and plans to stay in this area and establish care here.    Education Materials:  "Living Better With Heart Failure" Booklet, Daily Weight Tracker Tool .   High Risk Criteria for Readmission and/or Poor Patient Outcomes:  (Recommend Follow-up with Advanced Heart Failure Clinic)--Yes   EF <30%-  Yes--20-25%  2 or more admissions in 6 months- No  Difficult social situation- No  Demonstrates medication noncompliance- Yes--admits to not having money to buy medications at times  Barriers of Care:   Knowledge, Financial needs, Compliance  Discharge Planning:   Plans to go home with "friends/family", will need financial assistance for medications at discharge

## 2014-05-30 NOTE — Progress Notes (Signed)
Subjective:  Had hypoglycemia 60's - felt jittery. Likely from being NPO for RHC today. Coughing is better with rubitussin.  Slept well last night after a long time.   Objective: Vital signs in last 24 hours: Filed Vitals:   05/29/14 0518 05/29/14 1500 05/29/14 2056 05/30/14 0541  BP: 104/72 99/62 99/68  94/63  Pulse: 108 99 98 101  Temp: 98.2 F (36.8 C) 98 F (36.7 C) 97.5 F (36.4 C) 98.2 F (36.8 C)  TempSrc:  Oral Oral Oral  Resp: 20 20 18 18   Height:      Weight: 71.4 kg (157 lb 6.5 oz)   67.586 kg (149 lb)  SpO2: 99% 97% 98% 94%   Weight change: -3.814 kg (-8 lb 6.5 oz)  Intake/Output Summary (Last 24 hours) at 05/30/14 0806 Last data filed at 05/30/14 0739  Gross per 24 hour  Intake   1320 ml  Output   4350 ml  Net  -3030 ml   Vitals reviewed.  General: resting in bed, in NAD  HEENT: no scleral icterus, nasal congestion with swollen turbinates bilaterally, no clear discharge Cardiac: RRR, +gallop/s3. No murmurs.  Pulm: CTAB, no wheezes, rales, or rhonchi  Abd: soft, nontender, nondistended, BS present  Ext: warm and well perfused, no pedal edema  Neuro: alert and oriented X3, moves all extremities voluntairly   Lab Results: Basic Metabolic Panel:  Recent Labs Lab 05/28/14 1225 05/30/14 0506  NA 136* 142  K 4.9 3.7  CL 99 100  CO2 29 30  GLUCOSE 304* 138*  BUN 18 18  CREATININE 0.94 1.04  CALCIUM 9.0 9.6   CBC:  Recent Labs Lab 05/26/14 1138 05/27/14 0142  WBC 9.2 8.1  HGB 11.1* 11.6*  HCT 34.9* 36.0*  MCV 99.7 97.3  PLT 261 276   Cardiac Enzymes:  Recent Labs Lab 05/26/14 2017 05/27/14 0142 05/28/14 0951  TROPONINI <0.30 <0.30 <0.30   BNP:  Recent Labs Lab 05/26/14 1138  PROBNP 1034.0*   D-Dimer:  Recent Labs Lab 05/26/14 1138  DDIMER <0.27   CBG:  Recent Labs Lab 05/29/14 0627 05/29/14 1152 05/29/14 1543 05/29/14 2051 05/30/14 0631 05/30/14 0709  GLUCAP 173* 255* 157* 282* 69* 96   Hemoglobin  A1C:  Recent Labs Lab 05/26/14 2017  HGBA1C 11.0*   Thyroid Function Tests:  Recent Labs Lab 05/26/14 2017  TSH 0.934   Coagulation:  Recent Labs Lab 05/27/14 0142 05/28/14 0508 05/29/14 0639 05/30/14 0506  LABPROT 17.1* 15.0 24.8* 27.6*  INR 1.39 1.18 2.24* 2.57*   Urine Drug Screen: Drugs of Abuse     Component Value Date/Time   LABOPIA NONE DETECTED 05/26/2014 1423   COCAINSCRNUR POSITIVE* 05/26/2014 1423   LABBENZ NONE DETECTED 05/26/2014 1423   AMPHETMU NONE DETECTED 05/26/2014 1423   THCU NONE DETECTED 05/26/2014 1423   LABBARB NONE DETECTED 05/26/2014 1423     Micro Results: No results found for this or any previous visit (from the past 240 hour(s)). Studies/Results: No results found. Medications: I have reviewed the patient's current medications. Scheduled Meds: . aspirin EC  81 mg Oral Daily  . atorvastatin  40 mg Oral q1800  . azithromycin  250 mg Oral Daily  . carvedilol  3.125 mg Oral BID WC  . furosemide  80 mg Intravenous BID  . insulin aspart  0-9 Units Subcutaneous TID WC  . insulin glargine  32 Units Subcutaneous QHS  . losartan  25 mg Oral Daily  . pantoprazole  40 mg Oral Daily  .  sodium chloride  3 mL Intravenous Q12H  . sodium chloride  3 mL Intravenous Q12H  . sodium chloride  3 mL Intravenous Q12H  . spironolactone  12.5 mg Oral Daily  . Warfarin - Pharmacist Dosing Inpatient   Does not apply q1800   Continuous Infusions:  PRN Meds:.sodium chloride, sodium chloride, acetaminophen, acetaminophen, dextrose, guaiFENesin-dextromethorphan, sodium chloride, sodium chloride Assessment/Plan: 53 year old man with PMH of Left ventricular non-compaction s/p ICD placement, on coumadin, EF of 20-24% per 2D echo in 10/2010, followed at [facility], presenting with dry cough for months, increased DOE for 2 weeks, UDS+ for cocaine.   Acute on chronic systolic CHF execerbation - In the setting of cocaine use and salt indiscretion.   He has history of left  ventricular non-compaction which has been worked up and treated at [facility]. Last ECHO on 10/2010 showed EF 20-24%, global hypokinesis. He is s/p ICD implant, is on coumadin. On presentation he appeared mildly volume overloaded with cough and orthopenia, weight up by 5 lb (from 153). He diuresed well with IV Lasix but had persistent orthopnea. Net negative by 3.5L but he is noncompliant with a low sodium diet, had pizza for dinner on 8/15 and again complains of cough and DOE today. Repeat 2D echo on 05/27/14: EF 20-25%, diffuse hypokinesis, akinesis of entireapical myocardium, akinesis of entireanterior myocardium, noncompaction of the LV and RV. Repeat CXR on 05/27/14 with mild interstitial edema and patient still symptomatic.  - appears to be in cardiogenic shock with prominent S3/gallop, crackles, and hypotension. - heart failure team on board: will get RHC today per cards. Needs  Advanced cardiac therapy. Not candidate for transplant due to substance abuse but may be candidate for IV milrinone or VAD.  - I/o, daily weights, CBC, BMP  - Continue home coreg 6.250 BID, losartan 25mg  daily, spironolactone 12.5mg  daily.  - needs to f/up with PCP at [facility] immediately after d/c immediately for INR f/up. Was subtherapeutic on admission. Now therapeutic.   HTN- He is on  coreg 6.250 BID, losartan 25mg  daily, spironolactone 12.5mg  daily (restarted), and lisinopril 2.5mg  at home (pt had been taken of ACEi by his PCP but continued taking this med).  -Continue Coreg 6.25mg , losartan 25mg , spironolactone 12.5mg  daily.  - d/ced lisinopril from home here since he has cough.   Chest pain: He complained of chest pain last night that resolved on its own, not associated with diaphoresis, or SOB. His EKG has no changes, troponin on presentation is negative.  -protonix 40mg  daily  -Continue monitoring  Cough: Complaints of worse cough in the past 2 weeks. Has nasal congestion with postnasal drip. Became productive of  clear/yellow sputum on presentation. Could be 2/2 to bronchitis or CHF exc. -Zpack for possible bronchitis with close INR monitoring  Hyperlipiedemia - - cont lipitor 40 mg daily   DM II - Uncontrolled. HgA1C of 11%. He is on Lantus 32 units qHS at home with Novolog SSI.  - became hypogelycemic overnight since he's NPO.  - Will change lantus to 24 for today. -Continue SSI -sensitive - at homeon  Lantus 32 units  Left ventricular non-compaction - on Anticoguatlion.Was taking 7mg  coumadin daily. Coumadin per pharmacy. Also on 325mg  ASA at home.  - has defib placed for LV noncompaction disease.  -Will do 81mg  asa daily, concern for risk of bleed with coumadin.  - continue coumadin per pharmacy. INR goal 2-3. -needs f/up outpatient for INR checks.  Cocaine Abuse  -discussed cocaine cessation   Tobacco  abuse  -discussed cessation   Dispo: Disposition is deferred at this time, awaiting improvement of current medical problems.  Anticipated discharge in approximately 1 day(s).   The patient does have a current PCP (Minda Ditto, MD) and does need an [facility] hospital follow-up appointment after discharge.  The patient does not have transportation limitations that hinder transportation to clinic appointments.  .Services Needed at time of discharge: Y = Yes, Blank = No PT:   OT:   RN:   Equipment:   Other:     LOS: 4 days   Dellia Nims, MD 05/30/2014, 8:06 AM

## 2014-05-30 NOTE — H&P (View-Only) (Signed)
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   Was diagnosed with HF in 2009 at [facility] with Dr. Damaris Schooner. Did very well since that time with NYHA II symptoms without diuretics. A few weeks ago saw Dr. Erline Levine with progressive HF symptoms. Prescribed lasix but he didn't fill it. Admitted here for ADHF.   ECHO EF 20-25%. RV ok.   He was diuresed with some improvement in his symptoms but remains NYHA IIIB-IV with dyspnea on minimal exertion. Lasix had to be held due to hypotension.   Lives with his 24 y/o parents. Has co-custody of his 80-yo daughter.   Smokes about 1/2 ppd. Recreational cocaine x 1.5 year.   Review of Systems: [y] = yes, [ ]  = no   General: Weight gain [ ] ; Weight loss [ ] ; Anorexia [ ] ; Fatigue Blue.Reese ]; Fever [ ] ; Chills [ ] ; Weakness [ y]  Cardiac: Chest pain/pressure [ ] ; Resting SOB [ y]; Exertional SOB [ y]; Orthopnea [ y]; Pedal Edema [ ] ; Palpitations [ ] ; Syncope [ ] ; Presyncope [ ] ; Paroxysmal nocturnal dyspnea[ ]   Pulmonary: Cough Blue.Reese ]; Wheezing[ ] ; Hemoptysis[ ] ; Sputum [ ] ; Snoring [ ]   GI: Vomiting[ ] ; Dysphagia[ ] ; Melena[ ] ; Hematochezia [ ] ; Heartburn[ ] ; Abdominal pain [ ] ; Constipation [ ] ; Diarrhea [ ] ; BRBPR [ ]   GU: Hematuria[ ] ; Dysuria [ ] ; Nocturia[ ]   Vascular: Pain in legs with walking [ ] ; Pain in feet with lying flat [ ] ; Non-healing sores [ ] ; Stroke [ ] ; TIA [ ] ; Slurred speech [ ] ;  Neuro: Headaches[ ] ; Vertigo[ ] ; Seizures[ ] ; Paresthesias[ ] ;Blurred vision [ ] ; Diplopia [ ] ; Vision changes [ ]   Ortho/Skin: Arthritis [ ] ; Joint pain [ ] ; Muscle pain [ ] ; Joint swelling [ ] ; Back Pain [ ] ; Rash [ ]   Psych: Depression[ ] ; Anxiety[ ]   Heme: Bleeding problems [ ] ; Clotting disorders [ ] ; Anemia [ ]   Endocrine: Diabetes [ y]; Thyroid dysfunction[  ]  Home Medications Prior to Admission medications   Medication Sig Start Date End Date Taking? Authorizing Provider  aspirin 325 MG tablet Take 325 mg by mouth daily.   Yes Historical Provider, MD  carvedilol (COREG) 3.125 MG tablet Take 3.125 mg by mouth 2 (two) times daily with a meal.   Yes Historical Provider, MD  insulin glargine (LANTUS) 100 UNIT/ML injection Inject 32 Units into the skin at bedtime.   Yes Historical Provider, MD  lisinopril (PRINIVIL,ZESTRIL) 2.5 MG tablet Take 2.5 mg by mouth daily.   Yes Historical Provider, MD  losartan (COZAAR) 25 MG tablet Take 25 mg by mouth daily. 05/16/14 05/16/15 Yes Historical Provider, MD  QC PEN NEEDLES 31G X 6 MM MISC 1 each by Other route See admin instructions. Use daily with insulin. 08/06/12  Yes Historical Provider, MD  simvastatin (ZOCOR) 80 MG tablet Take 80 mg by mouth at bedtime.   Yes Historical Provider, MD  spironolactone (ALDACTONE) 25 MG tablet Take 12.5 mg by mouth daily.    Yes Historical Provider, MD  warfarin (COUMADIN) 2 MG tablet Take 2 mg by mouth daily. 2 mg in addition to 5 mg   Yes Historical Provider, MD  warfarin (COUMADIN) 5 MG tablet Take 5 mg by mouth daily. 5 mg in addition to 2 mg  Yes Historical Provider, MD    Past Medical History: Past Medical History  Diagnosis Date  . CHF (congestive heart failure)   . Drug abuse and dependence   . Automatic implantable cardioverter-defibrillator in situ   . Heart murmur   . Exertional shortness of breath   . Type I diabetes mellitus   . Pneumonia ~ 1988  . Sleep apnea     "cleared after T&A"  . Cardiomyopathy, ischemic 07/21/2011  . BP (high blood pressure) 03/04/2013  . Left ventricular noncompaction 05/27/2014  . Acute on chronic systolic heart failure     Past Surgical History: Past Surgical History  Procedure Laterality Date  . Forearm fracture surgery Left 1980  . Cardiac defibrillator placement  03/2011  . Cholecystectomy  1994  . Tonsillectomy and  adenoidectomy  ~ 1998    Family History: Family History  Problem Relation Age of Onset  . Diabetes Mother   . Heart disease Mother   . Diabetes Father   . Prostate cancer Father   . Heart disease Father   . Diabetes Brother   . Diabetes Brother     Social History: History   Social History  . Marital Status: Single    Spouse Name: N/A    Number of Children: 1  . Years of Education: N/A   Occupational History  . Disabled    Social History Main Topics  . Smoking status: Former Smoker -- 0.50 packs/day for 35 years    Types: Cigarettes    Quit date: 05/24/2014  . Smokeless tobacco: Never Used  . Alcohol Use: Yes     Comment: 05/26/2014 "might have a drink maybe once/month, if that"  . Drug Use: Yes    Special: Cocaine     Comment: 05/26/2014  "last cocaine was ~ 5 days ago"  . Sexual Activity: Yes   Other Topics Concern  . None   Social History Narrative  . None    Allergies:  No Known Allergies  Objective:    Vital Signs:   Temp:  [98 F (36.7 C)-99.1 F (37.3 C)] 98 F (36.7 C) (08/17 1500) Pulse Rate:  [99-108] 99 (08/17 1500) Resp:  [19-20] 20 (08/17 1500) BP: (87-104)/(62-72) 99/62 mmHg (08/17 1500) SpO2:  [89 %-99 %] 97 % (08/17 1500) Weight:  [71.4 kg (157 lb 6.5 oz)] 71.4 kg (157 lb 6.5 oz) (08/17 0518) Last BM Date: 05/25/14  Weight change: Filed Weights   05/27/14 0426 05/28/14 0500 05/29/14 0518  Weight: 71.6 kg (157 lb 13.6 oz) 71.5 kg (157 lb 10.1 oz) 71.4 kg (157 lb 6.5 oz)    Intake/Output:   Intake/Output Summary (Last 24 hours) at 05/29/14 1825 Last data filed at 05/29/14 1500  Gross per 24 hour  Intake    600 ml  Output   1700 ml  Net  -1100 ml     Physical Exam: General:  Fatigued appearing. Hacking cough  HEENT: normal Neck: supple. JVP 10. Carotids 2+ bilat; no bruits. No lymphadenopathy or thryomegaly appreciated. Cor: PMI laterally displaced. Regular rate & rhythm. Loud s3 Lungs: basilar crackles  Abdomen: soft,  nontender, + distended. No hepatosplenomegaly. No bruits or masses. Good bowel sounds. Extremities: no cyanosis, clubbing, rash, edema Neuro: alert & orientedx3, cranial nerves grossly intact. moves all 4 extremities w/o difficulty. Affect pleasant  Telemetry:  NSR   Labs: Basic Metabolic Panel:  Recent Labs Lab 05/26/14 1138 05/27/14 0142 05/28/14 1225  NA 141 137 136*  K 4.1 4.3 4.9  CL 105 98 99  CO2 25 30 29   GLUCOSE 232* 414* 304*  BUN 8 13 18   CREATININE 0.83 0.92 0.94  CALCIUM 8.8 8.7 9.0    Liver Function Tests: No results found for this basename: AST, ALT, ALKPHOS, BILITOT, PROT, ALBUMIN,  in the last 168 hours No results found for this basename: LIPASE, AMYLASE,  in the last 168 hours No results found for this basename: AMMONIA,  in the last 168 hours  CBC:  Recent Labs Lab 05/26/14 1138 05/27/14 0142  WBC 9.2 8.1  HGB 11.1* 11.6*  HCT 34.9* 36.0*  MCV 99.7 97.3  PLT 261 276    Cardiac Enzymes:  Recent Labs Lab 05/26/14 1138 05/26/14 2017 05/27/14 0142 05/28/14 0951  TROPONINI <0.30 <0.30 <0.30 <0.30    BNP: BNP (last 3 results)  Recent Labs  05/15/14 0805 05/26/14 1138  PROBNP 1920.0* 1034.0*    CBG:  Recent Labs Lab 05/28/14 1110 05/28/14 1659 05/29/14 0627 05/29/14 1152 05/29/14 1543  GLUCAP 277* 285* 173* 255* 157*    Coagulation Studies:  Recent Labs  05/27/14 0142 05/28/14 0508 05/29/14 0639  LABPROT 17.1* 15.0 24.8*  INR 1.39 1.18 2.24*    Other results: EKG: NSR 98 LVH with repol. Narrow QRS  Imaging:  No results found.   Medications:     Current Medications: . aspirin EC  81 mg Oral Daily  . atorvastatin  40 mg Oral q1800  . azithromycin  250 mg Oral Daily  . carvedilol  6.25 mg Oral BID WC  . furosemide  20 mg Oral Daily  . insulin aspart  0-9 Units Subcutaneous TID WC  . insulin glargine  32 Units Subcutaneous QHS  . losartan  25 mg Oral Daily  . pantoprazole  40 mg Oral Daily  . sodium  chloride  3 mL Intravenous Q12H  . sodium chloride  3 mL Intravenous Q12H  . spironolactone  12.5 mg Oral Daily  . Warfarin - Pharmacist Dosing Inpatient   Does not apply q1800     Infusions:      Assessment:   1) Acute on chronic systolic HF with low output physiology 2) Ventricular noncompaction 3) Mixed ischemic/nonischemic CM    --with collateralization of LAD 4) Tobacco use, ongoing 5) Cocaine use 6) DM2  Plan/Discussion:     He has advanced HF symptoms suggestive of cardiogenic shock physiology. On exam he is volume overloaded and has very prominent s3. He will likely need advanced therapies. He is not candidate for transplant due to substance abuse but may be candidate for IV milrinone or VAD. Will plan RHC in am to further evaluate (ideally would do left as well but INR too high).   Give additional IV lasix tonight.   The HF team will follow.   Total time spent 45 minutes. Over 80% of that time spent discussing above.   Length of Stay: 3  Glori Bickers MD 05/29/2014, 6:25 PM  Advanced Heart Failure Team Pager [phone] (M-F; 7a - 4p)  Please contact [facility] for night-coverage after hours (4p -7a ) and weekends on [email or web address]

## 2014-05-30 NOTE — Interval H&P Note (Signed)
History and Physical Interval Note:  05/30/2014 1:47 PM  Dennis [name]  has presented today for surgery, with the diagnosis of HF  The various methods of treatment have been discussed with the patient and family. After consideration of risks, benefits and other options for treatment, the patient has consented to  Procedure(s): RIGHT HEART CATH (N/A) as a surgical intervention .  The patient's history has been reviewed, patient examined, no change in status, stable for surgery.  I have reviewed the patient's chart and labs.  Questions were answered to the patient's satisfaction.     Daniel Bensimhon

## 2014-05-30 NOTE — Progress Notes (Signed)
Inpatient Diabetes Program Recommendations  AACE/ADA: New Consensus Statement on Inpatient Glycemic Control (2013)  Target Ranges:  Prepandial:   less than 140 mg/dL      Peak postprandial:   less than 180 mg/dL (1-2 hours)      Critically ill patients:  140 - 180 mg/dL   Reason for Visit: Hypoglycemia  Pt NPO for testing. Very mild hypoglycemia this am and treated per Hypoglycemia Protocol. May want to consider decreasing Lantus to 30 units QHS while inpatient.   Results for CHANDON, LAZCANO (MRN [id number]) as of 05/30/2014 09:05  Ref. Range 05/29/2014 11:52 05/29/2014 15:43 05/29/2014 20:51 05/30/2014 06:31 05/30/2014 07:09  Glucose-Capillary Latest Range: 70-99 mg/dL 255 (H) 157 (H) 282 (H) 69 (L) 96   Pt needs timely follow-up with PCP for diabetes management with HgbA1C of 11.0%.  Thank you. Lorenda Peck, RD, LDN, CDE Inpatient Diabetes Coordinator [phone]

## 2014-05-30 NOTE — Progress Notes (Signed)
Hypoglycemic Event  CBG: 69  Treatment: 15 GM gel  Symptoms: None  Follow-up CBG: Time: 0709 CBG Result: 96  Possible Reasons for Event: Other: NPO  Comments/MD notified:Notified on-call Internal Medicine doctor    Lincoln Brigham, RASHIDA  Remember to initiate Hypoglycemia Order Set & complete

## 2014-05-30 NOTE — Progress Notes (Signed)
1305 Report given to [facility] ,Cardiac cath. Dr Genene Churn aware of hypogylycemia epiosode

## 2014-05-30 NOTE — Progress Notes (Signed)
Dennis [name] for Coumadin Indication: atrial fibrillation  No Known Allergies  Labs:  Recent Labs  05/28/14 0508 05/28/14 0951 05/28/14 1225 05/29/14 0639 05/30/14 0506  LABPROT 15.0  --   --  24.8* 27.6*  INR 1.18  --   --  2.24* 2.57*  CREATININE  --   --  0.94  --  1.04  TROPONINI  --  <0.30  --   --   --     Estimated Creatinine Clearance: 79.4 ml/min (by C-G formula based on Cr of 1.04).   Medical History: Past Medical History  Diagnosis Date  . CHF (congestive heart failure)   . Drug abuse and dependence   . Automatic implantable cardioverter-defibrillator in situ   . Heart murmur   . Exertional shortness of breath   . Type I diabetes mellitus   . Pneumonia ~ 1988  . Sleep apnea     "cleared after T&A"  . Cardiomyopathy, ischemic 07/21/2011  . BP (high blood pressure) 03/04/2013  . Left ventricular noncompaction 05/27/2014  . Acute on chronic systolic heart failure     Assessment: 53 year old male admitted with CHF exacerbation after cocaine abuse.  On Coumadin PTA for Afib.  INR=2.57  Goal of Therapy:  INR 2-3 Monitor platelets by anticoagulation protocol: Yes   Plan:  1) Coumadin 7.5 mg po x 1 dose at 1800 pm 2) Daily INR  Thank you. Anette Guarneri, PharmD [phone]  05/30/2014,1:08 PM

## 2014-05-30 NOTE — CV Procedure (Addendum)
Cardiac Cath Procedure Note:  Indication:  Heart failure  Procedures performed:  1) Right heart catheterization  Description of procedure:   The risks and indication of the procedure were explained. Consent was signed and placed on the chart. An appropriate timeout was taken prior to the procedure. The right neck was prepped and draped in the routine sterile fashion and anesthetized with 1% local lidocaine.   A 7 FR venous sheath was placed in the right internal jugular vein using a modified Seldinger technique. A standard Swan-Ganz catheter was used for the procedure.   Complications: None apparent.  Findings:  RA = 2 RV = 19/2/3 PA =  22/14 (18) PCW = 6 Fick cardiac output/index = 5.2/2.7 PVR = 2.3 WU FA sat = 93% PA sat = 60%, 64%  Assessment: 1. Volume depletion  2. Preserved cardiac output  Plan/Discussion:  He is quite dry after brisk diuresis overnight. Cardiac output much better than I expected. Would hold diuretics today. Then resume lasix 40 daily tomorrow. Will need close outpatient f/u in HF Clinic with CPX testing.  Glori Bickers MD 2:02 PM

## 2014-05-30 NOTE — Progress Notes (Signed)
1700 ate dinner well. No sign and symptom of hypoglycemia .

## 2014-05-30 NOTE — Progress Notes (Signed)
1045 CBG done : 62 pt fully awake alert and oriented , feeling not well, cool and clammy , hypoglycemia protocol   Initiated as charted to [facility] .  1100 repeat CBG done: 56  Repeat Glucose 25 mls  , D 50  Given  1118  CBG done 141 result Pt is now warm and dry No sign of hypoglycemia . Kept comfortable in bed . Pt informe of progress.

## 2014-05-30 NOTE — Progress Notes (Signed)
  Date: 05/30/2014  Patient name: Dennis [name]  Medical record number: [id number]  Date of birth: 11-Feb-1961   This patient has been seen and the plan of care was discussed with the house staff. Please see their note for complete details. I concur with their findings with the following additions/corrections:  - 7 L overall on IV lasix. Mr Sossamon for Wilsey today.  Bartholomew Crews, MD 05/30/2014, 11:50 AM

## 2014-05-30 NOTE — Progress Notes (Signed)
  [place]on a call to MD on call  Re; Pt's  BP low . 80's BPS Pt asymptomatic

## 2014-05-30 NOTE — Progress Notes (Signed)
Spoken with Dr. Genene Churn. To call cardiology to refer low Bp . Called and spoken with Dr. Haroldine Laws with orders Endorsed To night nurse . ,  [phone]

## 2014-05-31 ENCOUNTER — Ambulatory Visit: Payer: Medicare Other | Admitting: Pharmacist Clinician (PhC)/ Clinical Pharmacy Specialist

## 2014-05-31 LAB — BASIC METABOLIC PANEL
Anion gap: 14 (ref 5–15)
BUN: 23 mg/dL (ref 6–23)
CO2: 25 mEq/L (ref 19–32)
Calcium: 9.1 mg/dL (ref 8.4–10.5)
Chloride: 99 mEq/L (ref 96–112)
Creatinine, Ser: 1.12 mg/dL (ref 0.50–1.35)
GFR calc Af Amer: 86 mL/min — ABNORMAL LOW (ref >90)
GFR calc non Af Amer: 74 mL/min — ABNORMAL LOW (ref >90)
Glucose, Bld: 260 mg/dL — ABNORMAL HIGH (ref 70–99)
Potassium: 4.3 mEq/L (ref 3.7–5.3)
Sodium: 138 mEq/L (ref 137–147)

## 2014-05-31 LAB — GLUCOSE, CAPILLARY
Glucose-Capillary: 113 mg/dL — ABNORMAL HIGH (ref 70–99)
Glucose-Capillary: 177 mg/dL — ABNORMAL HIGH (ref 70–99)
Glucose-Capillary: 207 mg/dL — ABNORMAL HIGH (ref 70–99)
Glucose-Capillary: 209 mg/dL — ABNORMAL HIGH (ref 70–99)
Glucose-Capillary: 228 mg/dL — ABNORMAL HIGH (ref 70–99)
Glucose-Capillary: 87 mg/dL (ref 70–99)

## 2014-05-31 LAB — PROTIME-INR
INR: 2.67 — ABNORMAL HIGH (ref 0.00–1.49)
Prothrombin Time: 28.4 seconds — ABNORMAL HIGH (ref 11.6–15.2)

## 2014-05-31 MED ORDER — LOSARTAN POTASSIUM 25 MG PO TABS
12.5000 mg | ORAL_TABLET | Freq: Every day | ORAL | Status: DC
  Administered 2014-06-01: 12.5 mg via ORAL
  Filled 2014-05-31: qty 0.5

## 2014-05-31 MED ORDER — WARFARIN SODIUM 7.5 MG PO TABS
7.5000 mg | ORAL_TABLET | Freq: Once | ORAL | Status: AC
  Administered 2014-05-31: 7.5 mg via ORAL
  Filled 2014-05-31: qty 1

## 2014-05-31 NOTE — Progress Notes (Signed)
  Date: 05/31/2014  Patient name: Dennis [name]  Medical record number: [id number]  Date of birth: Dec 04, 1960   This patient has been seen and the plan of care was discussed with the house staff. Please see Dr Brandt Loosen note for complete details. I concur with his findings.  Bartholomew Crews, MD 05/31/2014, 3:52 PM

## 2014-05-31 NOTE — Progress Notes (Signed)
Subjective:  Coughing a lot better. SOB also improved. Feels better overall. His BP remains low, have been holding antihypertensives.    Objective: Vital signs in last 24 hours: Filed Vitals:   05/31/14 0427 05/31/14 0806 05/31/14 1042 05/31/14 1119  BP: 86/60 91/72 95/62    Pulse: 98 102 98   Temp: 98.2 F (36.8 C) 98.6 F (37 C)    TempSrc: Oral Oral    Resp: 18 19 18    Height:      Weight: 69.174 kg (152 lb 8 oz)     SpO2: 95% 98% 98% 100%   Weight change: 1.588 kg (3 lb 8 oz)  Intake/Output Summary (Last 24 hours) at 05/31/14 1435 Last data filed at 05/31/14 1326  Gross per 24 hour  Intake   1500 ml  Output    425 ml  Net   1075 ml   Vitals reviewed.  General: resting in bed, in NAD  HEENT: no scleral icterus, nasal congestion with swollen turbinates bilaterally, no clear discharge Cardiac: RRR, +gallop/s3. No murmurs.  Pulm: CTAB, no wheezes, rales, or rhonchi  Abd: soft, nontender, nondistended, BS present  Ext: warm and well perfused, no pedal edema  Neuro: alert and oriented X3, moves all extremities voluntairly   Lab Results: Basic Metabolic Panel:  Recent Labs Lab 05/28/14 1225 05/30/14 0506  NA 136* 142  K 4.9 3.7  CL 99 100  CO2 29 30  GLUCOSE 304* 138*  BUN 18 18  CREATININE 0.94 1.04  CALCIUM 9.0 9.6   CBC:  Recent Labs Lab 05/26/14 1138 05/27/14 0142  WBC 9.2 8.1  HGB 11.1* 11.6*  HCT 34.9* 36.0*  MCV 99.7 97.3  PLT 261 276   Cardiac Enzymes:  Recent Labs Lab 05/26/14 2017 05/27/14 0142 05/28/14 0951  TROPONINI <0.30 <0.30 <0.30   BNP:  Recent Labs Lab 05/26/14 1138  PROBNP 1034.0*   D-Dimer:  Recent Labs Lab 05/26/14 1138  DDIMER <0.27   CBG:  Recent Labs Lab 05/30/14 1842 05/30/14 2031 05/31/14 0028 05/31/14 0420 05/31/14 0803 05/31/14 1200  GLUCAP 299* 252* 209* 87 113* 228*   Hemoglobin A1C:  Recent Labs Lab 05/26/14 2017  HGBA1C 11.0*   Thyroid Function Tests:  Recent Labs Lab  05/26/14 2017  TSH 0.934   Coagulation:  Recent Labs Lab 05/28/14 0508 05/29/14 0639 05/30/14 0506 05/31/14 0349  LABPROT 15.0 24.8* 27.6* 28.4*  INR 1.18 2.24* 2.57* 2.67*   Urine Drug Screen: Drugs of Abuse     Component Value Date/Time   LABOPIA NONE DETECTED 05/26/2014 1423   COCAINSCRNUR POSITIVE* 05/26/2014 1423   LABBENZ NONE DETECTED 05/26/2014 1423   AMPHETMU NONE DETECTED 05/26/2014 1423   THCU NONE DETECTED 05/26/2014 1423   LABBARB NONE DETECTED 05/26/2014 1423     Micro Results: No results found for this or any previous visit (from the past 240 hour(s)). Studies/Results: No results found. Medications: I have reviewed the patient's current medications. Scheduled Meds: . aspirin EC  81 mg Oral Daily  . atorvastatin  40 mg Oral q1800  . azithromycin  250 mg Oral Daily  . furosemide  40 mg Oral Daily  . insulin aspart  0-9 Units Subcutaneous 6 times per day  . insulin glargine  24 Units Subcutaneous QHS  . losartan  25 mg Oral Daily  . pantoprazole  40 mg Oral Daily  . sodium chloride  3 mL Intravenous Q12H  . sodium chloride  3 mL Intravenous Q12H  . spironolactone  12.5  mg Oral Daily  . warfarin  7.5 mg Oral ONCE-1800  . Warfarin - Pharmacist Dosing Inpatient   Does not apply q1800   Continuous Infusions:  PRN Meds:.sodium chloride, acetaminophen, acetaminophen, acetaminophen, dextrose, guaiFENesin-dextromethorphan, ondansetron (ZOFRAN) IV, sodium chloride Assessment/Plan: 53 year old man with PMH of Left ventricular non-compaction s/p ICD placement, on coumadin, EF of 20-24% per 2D echo in 10/2010, followed at [facility], presenting with dry cough for months, increased DOE for 2 weeks, UDS+ for cocaine.   Acute on chronic systolic CHF execerbation - In the setting of cocaine use and salt indiscretion.   He has history of left ventricular non-compaction which has been worked up and treated at [facility]. Last ECHO on 10/2010 showed EF 20-24%, global hypokinesis. He is s/p  ICD implant, is on coumadin. On presentation he appeared mildly volume overloaded with cough and orthopenia, weight up by 5 lb (from 153). He diuresed well with IV Lasix but had persistent orthopnea. Net negative by 3.5L but he is noncompliant with a low sodium diet, had pizza for dinner on 8/15 and again complains of cough and DOE today. Repeat 2D echo on 05/27/14: EF 20-25%, diffuse hypokinesis, akinesis of entireapical myocardium, akinesis of entireanterior myocardium, noncompaction of the LV and RV.  - breathing is better but still with prominent S3/gallop and hypotension. - heart failure team on board: RHC showed better than expected LV function. Held lasix yesterday due to hypotension, with plan to resume today. Monitoring BP closely with lasix. Cards following. Maybe d/c tomorrow if BP holds on current regimen. Not candidate for transplant due to substance abuse. - I/o, daily weights, CBC, BMP  - hold. 6.250 BID, cont losartan 12.5mg  daily, spironolactone 12.5mg  daily.  - needs to f/up with PCP at [facility] immediately after d/c immediately for INR f/up. Was subtherapeutic on admission. Now therapeutic.   HTN- at home he is on coreg 6.250 BID, losartan 25mg  daily, spironolactone 12.5mg  daily (restarted), and lisinopril 2.5mg  at home (pt had been taken of ACEi by his PCP but continued taking this med).  -Continue losartan 12.5mg , spironolactone 12.5mg  daily.  - d/ced lisinopril from home here since he has cough.   Chest pain: He complained of chest pain last night that resolved on its own, not associated with diaphoresis, or SOB. His EKG has no changes, troponin on presentation is negative.  -protonix 40mg  daily  -Continue monitoring  Cough: Complaints of worse cough in the past 2 weeks. Has nasal congestion with postnasal drip. Became productive of clear/yellow sputum on presentation. Could be 2/2 to bronchitis or CHF exc. Improving. -Zpack for possible bronchitis  Hyperlipiedemia - -  cont lipitor 40 mg daily   DM II - Uncontrolled. HgA1C of 11%. He is on Lantus 32 units qHS at home with Novolog SSI.  - continue 24 lantus nightly -Continue SSI -sensitive  Left ventricular non-compaction - on Anticoguatlion.Was taking 7mg  coumadin daily. Coumadin per pharmacy. Also on 325mg  ASA at home.  - has defib placed for LV noncompaction disease.  -Will do 81mg  asa daily, concern for risk of bleed with coumadin.  - continue coumadin per pharmacy. INR goal 2-3. -needs f/up outpatient for INR checks.  Cocaine Abuse  -discussed cocaine cessation   Tobacco abuse  -discussed cessation   Dispo: Disposition is deferred at this time, awaiting improvement of current medical problems.  Anticipated discharge in approximately 1 day(s).   The patient does have a current PCP (Minda Ditto, MD) and does need an [facility] hospital follow-up appointment  after discharge.  The patient does not have transportation limitations that hinder transportation to clinic appointments.  .Services Needed at time of discharge: Y = Yes, Blank = No PT:   OT:   RN:   Equipment:   Other:     LOS: 5 days   Dellia Nims, MD 05/31/2014, 2:35 PM

## 2014-05-31 NOTE — Progress Notes (Signed)
I saw Dennis [name] again to review/reinforce HF education.  He is able to teach back all HF recommendations reviewed prior.  He is now living in [place] and establishing care here.  I have provided him a scale and he says that daily weights will be no problem.  We did discuss low sodium diet and he does admit to adding salt to grits 3-4 times per week lately.  He seems motivated and asked appropriate questions.  I will check back tomorrow to ensure that he has follow-up appt with AHF clinic and coordinate with care management about medication assistance.

## 2014-05-31 NOTE — Care Management Note (Addendum)
  Page 2 of 2   05/31/2014     10:11:18 PM CARE MANAGEMENT NOTE 05/31/2014  Patient:  [facility]   Account Number:  [id number]  Date Initiated:  05/29/2014  Documentation initiated by:  [facility]  Subjective/Objective Assessment:   CHF     Action/Plan:   CM to follow for dispositon needs   Anticipated DC Date:     Anticipated DC Plan:  [place]  CM consult      Choice offered to / List presented to:             Status of service:  Completed, signed off Medicare Important Message given?  YES (If response is "NO", the following Medicare IM given date fields will be blank) Date Medicare IM given:  05/29/2014 Medicare IM given by:  [facility] Date Additional Medicare IM given:   Additional Medicare IM given by:    Discharge Disposition:    Per UR Regulation:  Reviewed for med. necessity/level of care/duration of stay  If discussed at [place] of Stay Meetings, dates discussed:   06/01/2014    Comments:  Mariann Laster RN, BSN, MSHL, CCM  Nurse - Case Manager, (Unit [phone]  05/31/2014 AARP and MCD Social:   From home alone.  Family support sytem.  Patient has been compliant with healthcare mgmt through the [facility] but wishes to establish care in [place] d/t living in [place]. Transportation is more  complicated with healthcare mgmt in [place]. Hx/o substance abuse Heart Cath 05/30/14 Cardiologist:  Dr. Minda Ditto PCP:  Dr. Maudie Mercury CM provided $4.00 med list copy as resource HFC:  new THN: No - substance abuse Coumadin Lab appts established in [place] with Dr. Verl Blalock / [facility] scheduled CM provided education on purchasing scales and improtance of weighing daily. Disposition Plan:  Home / Self care.

## 2014-05-31 NOTE — Progress Notes (Signed)
Patient Name: Dennis [name] Date of Encounter: 05/31/2014     Principal Problem:   SOB Active Problems:   DIABETES MELLITUS, TYPE II   TOBACCO ABUSE   Drug abuse and dependence   CHF (congestive heart failure)   Long term (current) use of anticoagulants   Dyspnea   Left ventricular noncompaction   Acute on chronic systolic heart failure   Automatic implantable cardioverter-defibrillator in situ    SUBJECTIVE  Yesterday went for RHC (05/31/14) RA = 2  RV = 19/2/3  PA = 22/14 (18)  PCW = 6  Fick cardiac output/index = 5.2/2.7  PVR = 2.3 WU  FA sat = 93%  PA sat = 60%, 64%  Diuretics were stopped yesterday after cath d/t volume depletion. Denies SOB, orthopnea or edema. 24 hr I/O -1.6 liters and SBP 80-90s.    CURRENT MEDS . aspirin EC  81 mg Oral Daily  . atorvastatin  40 mg Oral q1800  . azithromycin  250 mg Oral Daily  . furosemide  40 mg Oral Daily  . insulin aspart  0-9 Units Subcutaneous 6 times per day  . insulin glargine  24 Units Subcutaneous QHS  . [START ON 06/01/2014] losartan  12.5 mg Oral Daily  . pantoprazole  40 mg Oral Daily  . sodium chloride  3 mL Intravenous Q12H  . sodium chloride  3 mL Intravenous Q12H  . spironolactone  12.5 mg Oral Daily  . warfarin  7.5 mg Oral ONCE-1800  . Warfarin - Pharmacist Dosing Inpatient   Does not apply q1800    OBJECTIVE  Filed Vitals:   05/31/14 0806 05/31/14 1042 05/31/14 1119 05/31/14 1440  BP: 91/72 95/62  91/61  Pulse: 102 98  99  Temp: 98.6 F (37 C)   98 F (36.7 C)  TempSrc: Oral   Oral  Resp: 19 18    Height:      Weight:      SpO2: 98% 98% 100% 99%    Intake/Output Summary (Last 24 hours) at 05/31/14 1657 Last data filed at 05/31/14 1326  Gross per 24 hour  Intake   1100 ml  Output    425 ml  Net    675 ml   Filed Weights   05/29/14 0518 05/30/14 0541 05/31/14 0427  Weight: 157 lb 6.5 oz (71.4 kg) 149 lb (67.586 kg) 152 lb 8 oz (69.174 kg)    PHYSICAL EXAM  General: Pleasant, NAD,  sitting up in chair Neuro: Alert and oriented X 3. Moves all extremities spontaneously. Psych: Normal affect. HEENT:  Normal  Neck: Supple without bruits or JVD. Lungs:  Resp regular and unlabored, Crackles at bases Heart: +loud S3, faint holosystolic apical murmur Abdomen: Soft, non-tender, non-distended, BS + x 4.  Extremities: No clubbing, cyanosis or edema. DP/PT/Radials 2+ and equal bilaterally.  Accessory Clinical Findings  CBC No results found for this basename: WBC, NEUTROABS, HGB, HCT, MCV, PLT,  in the last 72 hours Basic Metabolic Panel  Recent Labs  05/30/14 0506 05/31/14 1430  NA 142 138  K 3.7 4.3  CL 100 99  CO2 30 25  GLUCOSE 138* 260*  BUN 18 23  CREATININE 1.04 1.12  CALCIUM 9.6 9.1    Cardiac Enzymes No results found for this basename: CKTOTAL, CKMB, CKMBINDEX, TROPONINI,  in the last 72 hours  D-Dimer No results found for this basename: DDIMER,  in the last 72 hours Hemoglobin A1C No results found for this basename: HGBA1C,  in the last  72 hours  Thyroid Function Tests No results found for this basename: TSH, T4TOTAL, FREET3, T3FREE, THYROIDAB,  in the last 72 hours  TELE  Sinus with 1st deg AV block  Radiology/Studies  Dg Chest 2 View  05/27/2014   CLINICAL DATA:  Dry cough.  EXAM: CHEST  2 VIEW  COMPARISON:  05/26/2014.  FINDINGS: Cardiac silhouette is normal in size. No mediastinal or hilar masses. Stable left anterior chest wall sequential pacemaker.  Lungs are hyperexpanded. There is central vascular prominence and bilateral interstitial thickening most evident in the lung bases. There is more confluent lung base type opacity on the lateral view posteriorly. This is stable from the previous day's study no pleural effusion. No pneumothorax.  Bony thorax is intact.  IMPRESSION: 1. Diffuse interstitial thickening with more confluent lung base opacity. This is stable from the previous day's study. There is interstitial thickening has developed  since a chest radiograph dated 08/03/2011. As noted previously, this may reflect interstitial edema. Diffuse interstitial infection or inflammation is possible.   Electronically Signed   By: Lajean Manes M.D.   On: 05/27/2014 12:51   Dg Chest 2 View  05/26/2014   CLINICAL DATA:  Dry cough and shortness of breath. Left-sided chest pain.  EXAM: CHEST  2 VIEW  COMPARISON:  05/15/2014.  FINDINGS: Trachea is midline. Heart size stable. Left subclavian pacemaker lead tips are in the right atrium and right ventricle. There is new basilar predominant interstitial prominence and indistinctness with peripheral septal lines. No focal airspace consolidation. Tiny bilateral pleural effusions.  IMPRESSION: 1. Mild congestive heart failure. 2. Previously seen nodular density in the right infrahilar region is poorly appreciated.   Electronically Signed   By: Lorin Picket M.D.   On: 05/26/2014 12:30   Dg Chest 2 View  05/15/2014   CLINICAL DATA:  Chest pain, shortness of breath, history of CHF  EXAM: CHEST  2 VIEW  COMPARISON:  08/09/2013  FINDINGS: Normal heart size and vascularity. Left subclavian 2 lead pacer noted. Lungs remain clear without focal pneumonia, collapse or consolidation. No edema, pleural effusion or pneumothorax. Trachea is midline. Atherosclerosis of the aorta.  Right lower lobe 15 mm nodular density noted medially, new since the prior study which could be related to superimposed shadows versus a developing right lower lobe nodule. Recommend follow-up nonemergent chest CT.  Prior cholecystectomy noted.  IMPRESSION: Right lower lobe 15 mm nodular density versus developing nodule warrants follow-up nonemergent chest CT  No superimposed acute chest process.  Stable postoperative findings.  These results will be called to the ordering clinician or representative by the Radiologist Assistant, and communication documented in the PACS or zVision Dashboard.   Electronically Signed   By: Daryll Brod M.D.   On:  05/15/2014 08:24     2D ECHO: 05/27/2014 LV EF: 20% - 25% Study Conclusions - Left ventricle: There appears to be noncompaction of the LV and RV. The cavity size was moderately dilated. Systolic function was severely reduced. The estimated ejection fraction was in the range of 20% to 25%. Diffuse hypokinesis. There is akinesis of the entireanteroseptal myocardium. There is akinesis of the entireapical myocardium. There is akinesis of the entireanterior myocardium. There was mild spontaneous echo contrast, indicative of stasis. - Aortic valve: There was trivial regurgitation. - Mitral valve: There was moderate regurgitation. - Left atrium: The atrium was moderately to severely dilated. - Pulmonary arteries: PA peak pressure: 32 mm Hg (S).   ASSESSMENT AND PLAN  Dennis [name] is  a 53 y.o. male with a history of left ventricular non-compaction on coumadin, poorly controlled DM, HTN, OSA, drug abuse (+ cocaine on UDS), NICM ( EF 10%--> 20-25%(echo 05/27/14)) s/p AICD who presented to [facility] with acute on chronic systolic CHF and bronchitis.  Acute on chronic systolic CHF- In the setting of cocaine use and salt indiscretion  -- 2D ECHO: 05/27/2014; EF 20-25%; noncompaction of the LV and RV. LV moderately dilated. Diffuse hypokinesis and akinesis of the entire anteroseptal myocardium. Mild spontaneous echo contrast, indicative of stasis. Mod MR. Severe LA dilation. PA pressure 32  - RHC yesterday showed volume depletion and preserved CO. Diuretics stopped. Will continue to hold diuretics today and will plan to start lasix 40 mg daily starting tomorrow. -- Continue home coreg 6.250 BID, losartan 12.5 mg daily and spironolactone 12.5 mg daily. -- Maybe able to go home tomorrow if stable and be seen in the HF clinic next week. He will need a CPX test to evaluate cardiac limitations. I feel as if he is nearing the need for advanced therapies.    Left ventricular non-compaction - on anticoagulation.  Now therapeutic at 2.24.  -- Has defib placed for LV noncompaction disease. Continue coumadin dosing by pharmacy.   HTN- He is on coreg 6.250 BID, losartan 12.5mg  daily, spironolactone 12.5mg  daily (restarted) - He has had low BP likely d/t volume depletion. Continue to hold diuretics until tomorrow.   Chest pain: He complained of chest pain last night that resolved on its own, not associated with diaphoresis, or SOB. -- No objective signs of ischemia. Ruled out for MI.  Cough: Complaints of worse cough in the past 2 weeks.  -- On  Zpack for possible bronchitis with close INR monitoring    Hyperlipidemia - cont statin  DM II - Uncontrolled: HgA1C of 11%.   Tobacco abuse /Cocaine Abuse  -- Discussed cessation   Dispo- Will be seen in the HF clinic and will place appt on chart.   Farrel Gordon B NP-C  Pager [phone]  I have examined the patient and reviewed assessment and plan and discussed with patient.  Agree with above as stated.  BP borderline after significant diuresis. Restarting low dose Lasix today.  He is still feeling poorly, but better than when he came to the hospital.  I am conerned that he may be having low output symptoms.  Will get heart failure team to evaluate.  He may need right heart cath as well.   He will need to avoid illegal drugs as well.  Junie Bame B   Patient seen and examined with Junie Bame, NP. We discussed all aspects of the encounter. I agree with the assessment and plan as stated above.   I reviewed RHC results with him in detail. Cath showed low filling pressures with normal cardiac output. BP remains low. Holding diuretics.   Although RHC shows relatively normal output, I do suspect his heart failure is quite advanced and getting worse (new diuretic requirement, hypotension requiring reduction of HF meds). Will try to restart diuretics tomorrow and get him on stable outpatient HF regimen. He will need outpatient CPX testing to further  risk stratify and see if he meets criteria for advanced therapies.  Daniel Bensimhon,MD 5:40 PM

## 2014-05-31 NOTE — Progress Notes (Signed)
[place] for Coumadin Indication: atrial fibrillation  No Known Allergies  Labs:  Recent Labs  05/29/14 0639 05/30/14 0506 05/31/14 0349  LABPROT 24.8* 27.6* 28.4*  INR 2.24* 2.57* 2.67*  CREATININE  --  1.04  --     Estimated Creatinine Clearance: 81.3 ml/min (by C-G formula based on Cr of 1.04).   Medical History: Past Medical History  Diagnosis Date  . CHF (congestive heart failure)   . Drug abuse and dependence   . Automatic implantable cardioverter-defibrillator in situ   . Heart murmur   . Exertional shortness of breath   . Type I diabetes mellitus   . Pneumonia ~ 1988  . Sleep apnea     "cleared after T&A"  . Cardiomyopathy, ischemic 07/21/2011  . BP (high blood pressure) 03/04/2013  . Left ventricular noncompaction 05/27/2014  . Acute on chronic systolic heart failure     Assessment: 53 year old male admitted with CHF exacerbation after cocaine abuse.  On Coumadin PTA for Afib.  INR=2.67  Goal of Therapy:  INR 2-3 Monitor platelets by anticoagulation protocol: Yes   Plan:  1) Coumadin 7.5 mg po x 1 dose at 1800 pm 2) Daily INR  Thank you. Anette Guarneri, PharmD Eunice Blase, PharmD Candidate [phone]  05/31/2014,1:57 PM

## 2014-06-01 LAB — GLUCOSE, CAPILLARY
Glucose-Capillary: 143 mg/dL — ABNORMAL HIGH (ref 70–99)
Glucose-Capillary: 182 mg/dL — ABNORMAL HIGH (ref 70–99)
Glucose-Capillary: 187 mg/dL — ABNORMAL HIGH (ref 70–99)
Glucose-Capillary: 211 mg/dL — ABNORMAL HIGH (ref 70–99)
Glucose-Capillary: 222 mg/dL — ABNORMAL HIGH (ref 70–99)
Glucose-Capillary: 225 mg/dL — ABNORMAL HIGH (ref 70–99)

## 2014-06-01 LAB — BASIC METABOLIC PANEL
Anion gap: 10 (ref 5–15)
BUN: 19 mg/dL (ref 6–23)
CO2: 25 mEq/L (ref 19–32)
Calcium: 9 mg/dL (ref 8.4–10.5)
Chloride: 103 mEq/L (ref 96–112)
Creatinine, Ser: 1.07 mg/dL (ref 0.50–1.35)
GFR calc Af Amer: >90 mL/min (ref >90)
GFR calc non Af Amer: 78 mL/min — ABNORMAL LOW (ref >90)
Glucose, Bld: 190 mg/dL — ABNORMAL HIGH (ref 70–99)
Potassium: 4.2 mEq/L (ref 3.7–5.3)
Sodium: 138 mEq/L (ref 137–147)

## 2014-06-01 LAB — PROTIME-INR
INR: 2.93 — ABNORMAL HIGH (ref 0.00–1.49)
Prothrombin Time: 30.6 seconds — ABNORMAL HIGH (ref 11.6–15.2)

## 2014-06-01 MED ORDER — FUROSEMIDE 20 MG PO TABS: 20.0000 mg | ORAL_TABLET | Freq: Every day | ORAL | Status: DC

## 2014-06-01 MED ORDER — PANTOPRAZOLE SODIUM 40 MG PO TBEC: 40.0000 mg | DELAYED_RELEASE_TABLET | Freq: Every day | ORAL | Status: DC

## 2014-06-01 MED ORDER — LOSARTAN POTASSIUM 25 MG PO TABS: 12.5000 mg | ORAL_TABLET | Freq: Every day | ORAL | Status: DC

## 2014-06-01 MED ORDER — WARFARIN SODIUM 2.5 MG PO TABS: 2.5000 mg | ORAL_TABLET | Freq: Once | ORAL | Status: DC

## 2014-06-01 MED ORDER — FUROSEMIDE 40 MG PO TABS: 20.0000 mg | ORAL_TABLET | ORAL | Status: DC

## 2014-06-01 MED ORDER — FUROSEMIDE 20 MG PO TABS: 20.0000 mg | ORAL_TABLET | ORAL | Status: DC

## 2014-06-01 MED ORDER — WARFARIN SODIUM 7.5 MG PO TABS
7.5000 mg | ORAL_TABLET | Freq: Every day | ORAL | Status: DC
Start: 2014-06-01 — End: 2014-06-01

## 2014-06-01 MED ORDER — WARFARIN SODIUM 5 MG PO TABS: 5.0000 mg | ORAL_TABLET | Freq: Every day | ORAL | Status: DC

## 2014-06-01 MED ORDER — WARFARIN SODIUM 7.5 MG PO TABS: 7.0000 mg | ORAL_TABLET | Freq: Every day | ORAL | Status: DC

## 2014-06-01 MED ORDER — WARFARIN SODIUM 2.5 MG PO TABS
2.5000 mg | ORAL_TABLET | Freq: Once | ORAL | Status: DC
  Filled 2014-06-01: qty 1

## 2014-06-01 MED ORDER — WARFARIN SODIUM 5 MG PO TABS: 7.0000 mg | ORAL_TABLET | Freq: Every day | ORAL | Status: DC

## 2014-06-01 MED ORDER — WARFARIN SODIUM 2 MG PO TABS: 2.0000 mg | ORAL_TABLET | Freq: Every day | ORAL | Status: DC

## 2014-06-01 MED ORDER — SPIRONOLACTONE 12.5 MG HALF TABLET
12.5000 mg | ORAL_TABLET | Freq: Every day | ORAL | Status: DC
Start: 2014-06-01 — End: 2014-09-21

## 2014-06-01 NOTE — Progress Notes (Signed)
Patient Name: Dennis [name] Date of Encounter: 06/01/2014     Principal Problem:   SOB Active Problems:   DIABETES MELLITUS, TYPE II   TOBACCO ABUSE   Drug abuse and dependence   CHF (congestive heart failure)   Long term (current) use of anticoagulants   Dyspnea   Left ventricular noncompaction   Acute on chronic systolic heart failure   Automatic implantable cardioverter-defibrillator in situ    SUBJECTIVE  Yesterday went for RHC (05/31/14) RA = 2  RV = 19/2/3  PA = 22/14 (18)  PCW = 6  Fick cardiac output/index = 5.2/2.7  PVR = 2.3 WU  FA sat = 93%  PA sat = 60%, 64%  Denies SOB, orthopnea or edema. 24 hr I/O -200 cc. SBP 80-90s.    CURRENT MEDS . aspirin EC  81 mg Oral Daily  . atorvastatin  40 mg Oral q1800  . insulin aspart  0-9 Units Subcutaneous 6 times per day  . insulin glargine  24 Units Subcutaneous QHS  . losartan  12.5 mg Oral Daily  . pantoprazole  40 mg Oral Daily  . sodium chloride  3 mL Intravenous Q12H  . sodium chloride  3 mL Intravenous Q12H  . spironolactone  12.5 mg Oral Daily  . warfarin  2.5 mg Oral ONCE-1800  . Warfarin - Pharmacist Dosing Inpatient   Does not apply q1800    OBJECTIVE  Filed Vitals:   05/31/14 1119 05/31/14 1440 05/31/14 1950 06/01/14 0538  BP:  91/61 83/58 84/60   Pulse:  99 100 93  Temp:  98 F (36.7 C) 98.2 F (36.8 C) 98.1 F (36.7 C)  TempSrc:  Oral Oral Oral  Resp:   18 18  Height:      Weight:    153 lb 6.4 oz (69.582 kg)  SpO2: 100% 99% 100% 98%    Intake/Output Summary (Last 24 hours) at 06/01/14 1042 Last data filed at 06/01/14 0854  Gross per 24 hour  Intake   1220 ml  Output   1900 ml  Net   -680 ml   Filed Weights   05/30/14 0541 05/31/14 0427 06/01/14 0538  Weight: 149 lb (67.586 kg) 152 lb 8 oz (69.174 kg) 153 lb 6.4 oz (69.582 kg)    PHYSICAL EXAM  General: Pleasant, NAD, ambulating in the halls Neuro: Alert and oriented X 3. Moves all extremities spontaneously. Psych: Normal  affect. HEENT:  Normal  Neck: Supple without bruits or JVD. Lungs:  Resp regular and unlabored, Crackles at bases Heart: +loud S3, faint holosystolic apical murmur Abdomen: Soft, non-tender, non-distended, BS + x 4.  Extremities: No clubbing, cyanosis or edema. DP/PT/Radials 2+ and equal bilaterally.  Accessory Clinical Findings  CBC No results found for this basename: WBC, NEUTROABS, HGB, HCT, MCV, PLT,  in the last 72 hours Basic Metabolic Panel  Recent Labs  05/31/14 1430 06/01/14 0500  NA 138 138  K 4.3 4.2  CL 99 103  CO2 25 25  GLUCOSE 260* 190*  BUN 23 19  CREATININE 1.12 1.07  CALCIUM 9.1 9.0    Cardiac Enzymes No results found for this basename: CKTOTAL, CKMB, CKMBINDEX, TROPONINI,  in the last 72 hours  D-Dimer No results found for this basename: DDIMER,  in the last 72 hours Hemoglobin A1C No results found for this basename: HGBA1C,  in the last 72 hours  Thyroid Function Tests No results found for this basename: TSH, T4TOTAL, FREET3, T3FREE, THYROIDAB,  in the last 72 hours  TELE  Sinus with 1st deg AV block  Radiology/Studies  Dg Chest 2 View  05/27/2014   CLINICAL DATA:  Dry cough.  EXAM: CHEST  2 VIEW  COMPARISON:  05/26/2014.  FINDINGS: Cardiac silhouette is normal in size. No mediastinal or hilar masses. Stable left anterior chest wall sequential pacemaker.  Lungs are hyperexpanded. There is central vascular prominence and bilateral interstitial thickening most evident in the lung bases. There is more confluent lung base type opacity on the lateral view posteriorly. This is stable from the previous day's study no pleural effusion. No pneumothorax.  Bony thorax is intact.  IMPRESSION: 1. Diffuse interstitial thickening with more confluent lung base opacity. This is stable from the previous day's study. There is interstitial thickening has developed since a chest radiograph dated 08/03/2011. As noted previously, this may reflect interstitial edema. Diffuse  interstitial infection or inflammation is possible.   Electronically Signed   By: Lajean Manes M.D.   On: 05/27/2014 12:51   Dg Chest 2 View  05/26/2014   CLINICAL DATA:  Dry cough and shortness of breath. Left-sided chest pain.  EXAM: CHEST  2 VIEW  COMPARISON:  05/15/2014.  FINDINGS: Trachea is midline. Heart size stable. Left subclavian pacemaker lead tips are in the right atrium and right ventricle. There is new basilar predominant interstitial prominence and indistinctness with peripheral septal lines. No focal airspace consolidation. Tiny bilateral pleural effusions.  IMPRESSION: 1. Mild congestive heart failure. 2. Previously seen nodular density in the right infrahilar region is poorly appreciated.   Electronically Signed   By: Lorin Picket M.D.   On: 05/26/2014 12:30   Dg Chest 2 View  05/15/2014   CLINICAL DATA:  Chest pain, shortness of breath, history of CHF  EXAM: CHEST  2 VIEW  COMPARISON:  08/09/2013  FINDINGS: Normal heart size and vascularity. Left subclavian 2 lead pacer noted. Lungs remain clear without focal pneumonia, collapse or consolidation. No edema, pleural effusion or pneumothorax. Trachea is midline. Atherosclerosis of the aorta.  Right lower lobe 15 mm nodular density noted medially, new since the prior study which could be related to superimposed shadows versus a developing right lower lobe nodule. Recommend follow-up nonemergent chest CT.  Prior cholecystectomy noted.  IMPRESSION: Right lower lobe 15 mm nodular density versus developing nodule warrants follow-up nonemergent chest CT  No superimposed acute chest process.  Stable postoperative findings.  These results will be called to the ordering clinician or representative by the Radiologist Assistant, and communication documented in the PACS or zVision Dashboard.   Electronically Signed   By: Daryll Brod M.D.   On: 05/15/2014 08:24     2D ECHO: 05/27/2014 LV EF: 20% - 25% Study Conclusions - Left ventricle: There  appears to be noncompaction of the LV and RV. The cavity size was moderately dilated. Systolic function was severely reduced. The estimated ejection fraction was in the range of 20% to 25%. Diffuse hypokinesis. There is akinesis of the entireanteroseptal myocardium. There is akinesis of the entireapical myocardium. There is akinesis of the entireanterior myocardium. There was mild spontaneous echo contrast, indicative of stasis. - Aortic valve: There was trivial regurgitation. - Mitral valve: There was moderate regurgitation. - Left atrium: The atrium was moderately to severely dilated. - Pulmonary arteries: PA peak pressure: 32 mm Hg (S).   ASSESSMENT AND PLAN  Dennis [name] is a 53 y.o. male with a history of left ventricular non-compaction on coumadin, poorly controlled DM, HTN, OSA, drug abuse (+ cocaine on UDS),  NICM ( EF 10%--> 20-25%(echo 05/27/14)) s/p AICD who presented to [facility] with acute on chronic systolic CHF and bronchitis.  Acute on chronic systolic CHF- In the setting of cocaine use and salt indiscretion  -- 2D ECHO: 05/27/2014; EF 20-25%; noncompaction of the LV and RV. LV moderately dilated. Diffuse hypokinesis and akinesis of the entire anteroseptal myocardium. Mild spontaneous echo contrast, indicative of stasis. Mod MR. Severe LA dilation. PA pressure 32  - RHC (8/18) showed volume depletion and preserved CO. Diuretics held x 1 dose. Will restart lasix 20 mg on Mondays, Wednesdays and Fridays. - SBP low 80-90s. Will start low dose coreg 3.125 mg BID. Continue losartan 12.5 mg daily and losartan 12.5 mg daily.  -OK to go home today and be seen in HF clinic next week. He will need a CPX test to evaluate cardiac limitations. I feel as if he is nearing the need for advanced therapies.    Left ventricular non-compaction - on anticoagulation. Therapeutic 2.93.  -- Has defib placed for LV noncompaction disease. Continue coumadin. Stop ASA. Will need coumadin appt on OP side, HF  clinic does not manage coumadin.  HTN-  As above will try to restart low dose coreg and watch BP on OP side. Told to call if any dizziness.  Chest pain: He complained of chest pain last night that resolved on its own, not associated with diaphoresis, or SOB. -- No objective signs of ischemia. Ruled out for MI.  Cough: Complaints of worse cough in the past 2 weeks.  -- On  Zpack for possible bronchitis with close INR monitoring    Hyperlipidemia - cont statin  DM II - Uncontrolled: HgA1C of 11%.   Tobacco abuse /Cocaine Abuse  -- Discussed cessation   Dispo- Will be seen in the HF clinic 06/08/14 @ 9:15 am\  Medications for HF: Lasix 20 mg M/W/F Coreg 3.125 mg BID Losartan 12.5 mg daily Spiro 12.5 mg daily Coumadin 7 mg daily (will need OP follow-up in 1 week, we do not manage Coumadin. [facility] send to [facility])   Signed, Rande Brunt NP-C  Advanced Heart Failure Team Pager [phone] (M-F; 7a - 4p)  Please contact [facility] for night-coverage after hours (4p -7a ) and weekends on [email or web address]   Patient seen and examined with Junie Bame, NP. We discussed all aspects of the encounter. I agree with the assessment and plan as stated above.   Much improved. BP soft. Home HF regimen will be tricky. Agree with regimen as laid out about. Stop ASA. Long discussion about sliding scale lasix. Needs coumadin f/u arranged.  As he is transferring his care from [facility] here careful discharge planning will be critical. I have discussed with IM team personally. Will see him back in HF clinic next week with labs and to schedule CPX test.   Benay Spice 11:12 AM

## 2014-06-01 NOTE — Progress Notes (Cosign Needed)
Subjective:  Continues to have low BP. Received 1x 40 lasix yesterday but night dose was cancelled due to hypotension.  Feels no symptoms. SOB is improved, no cp, no n/v/headache.  Eating well.  Now SBP 80-90's. Denies any cp, sob, coughing. Ambulating in the hallway without problem.   Objective: Vital signs in last 24 hours: Filed Vitals:   05/31/14 1119 05/31/14 1440 05/31/14 1950 06/01/14 0538  BP:  91/61 83/58 84/60   Pulse:  99 100 93  Temp:  98 F (36.7 C) 98.2 F (36.8 C) 98.1 F (36.7 C)  TempSrc:  Oral Oral Oral  Resp:   18 18  Height:      Weight:    69.582 kg (153 lb 6.4 oz)  SpO2: 100% 99% 100% 98%   Weight change: 0.408 kg (14.4 oz)  Intake/Output Summary (Last 24 hours) at 06/01/14 1133 Last data filed at 06/01/14 0854  Gross per 24 hour  Intake   1020 ml  Output   1900 ml  Net   -880 ml   Vitals reviewed.  General: resting in bed, in NAD  HEENT: no scleral icterus, nasal congestion with swollen turbinates bilaterally, no clear discharge Cardiac: RRR, +gallop/s3. No murmurs.  Pulm: CTAB, no wheezes, rales, or rhonchi  Abd: soft, nontender, nondistended, BS present  Ext: warm and well perfused, no pedal edema  Neuro: alert and oriented X3, moves all extremities voluntairly   Lab Results: Basic Metabolic Panel:  Recent Labs Lab 05/31/14 1430 06/01/14 0500  NA 138 138  K 4.3 4.2  CL 99 103  CO2 25 25  GLUCOSE 260* 190*  BUN 23 19  CREATININE 1.12 1.07  CALCIUM 9.1 9.0   CBC:  Recent Labs Lab 05/26/14 1138 05/27/14 0142  WBC 9.2 8.1  HGB 11.1* 11.6*  HCT 34.9* 36.0*  MCV 99.7 97.3  PLT 261 276   Cardiac Enzymes:  Recent Labs Lab 05/26/14 2017 05/27/14 0142 05/28/14 0951  TROPONINI <0.30 <0.30 <0.30   BNP:  Recent Labs Lab 05/26/14 1138  PROBNP 1034.0*   D-Dimer:  Recent Labs Lab 05/26/14 1138  DDIMER <0.27   CBG:  Recent Labs Lab 05/31/14 2000 06/01/14 0041 06/01/14 0234 06/01/14 0438 06/01/14 0816  06/01/14 1106  GLUCAP 207* 187* 182* 211* 143* 222*   Hemoglobin A1C:  Recent Labs Lab 05/26/14 2017  HGBA1C 11.0*   Thyroid Function Tests:  Recent Labs Lab 05/26/14 2017  TSH 0.934   Coagulation:  Recent Labs Lab 05/29/14 0639 05/30/14 0506 05/31/14 0349 06/01/14 0500  LABPROT 24.8* 27.6* 28.4* 30.6*  INR 2.24* 2.57* 2.67* 2.93*   Urine Drug Screen: Drugs of Abuse     Component Value Date/Time   LABOPIA NONE DETECTED 05/26/2014 1423   COCAINSCRNUR POSITIVE* 05/26/2014 1423   LABBENZ NONE DETECTED 05/26/2014 1423   AMPHETMU NONE DETECTED 05/26/2014 1423   THCU NONE DETECTED 05/26/2014 1423   LABBARB NONE DETECTED 05/26/2014 1423     Micro Results: No results found for this or any previous visit (from the past 240 hour(s)). Studies/Results: No results found. Medications: I have reviewed the patient's current medications. Scheduled Meds: . aspirin EC  81 mg Oral Daily  . atorvastatin  40 mg Oral q1800  . insulin aspart  0-9 Units Subcutaneous 6 times per day  . insulin glargine  24 Units Subcutaneous QHS  . losartan  12.5 mg Oral Daily  . pantoprazole  40 mg Oral Daily  . sodium chloride  3 mL Intravenous Q12H  .  sodium chloride  3 mL Intravenous Q12H  . spironolactone  12.5 mg Oral Daily  . warfarin  2.5 mg Oral ONCE-1800  . Warfarin - Pharmacist Dosing Inpatient   Does not apply q1800   Continuous Infusions:  PRN Meds:.sodium chloride, acetaminophen, acetaminophen, acetaminophen, dextrose, guaiFENesin-dextromethorphan, ondansetron (ZOFRAN) IV, sodium chloride Assessment/Plan: 53 year old man with PMH of Left ventricular non-compaction s/p ICD placement, on coumadin, EF of 20-24% per 2D echo in 10/2010, followed at [facility], presenting with dry cough for months, increased DOE for 2 weeks, UDS+ for cocaine.   Acute on chronic systolic CHF execerbation - In the setting of cocaine use and salt indiscretion.   He has history of left ventricular non-compaction which  has been worked up and treated at [facility]. Last ECHO on 10/2010 showed EF 20-24%, global hypokinesis. He is s/p ICD implant, is on coumadin. On presentation he appeared mildly volume overloaded with cough and orthopenia, weight up by 5 lb (from 153). He diuresed well with IV Lasix but had persistent orthopnea. He was noncompliant with a low sodium diet, repeat 2D echo on 05/27/14: EF 20-25%, diffuse hypokinesis, akinesis of entireapical myocardium, akinesis of entireanterior myocardium, noncompaction of the LV and RV. We have tried to adjust his lasix dose since his BP runs low. - breathing is better, no crackles, but still with prominent S3/gallop and hypotension. - heart failure team on board: [place] showed better than expected LV function. BP running 80-90's.   - F/up card recs: Lasix 20mg  M,W,F. Coreg 3.125mg  BID. Losartan 12.5mg  daily, spironolactone 12.5mg  daily - Will need outpatient testing to risk stratify for advanced therapies for HF with Dr. Tempie Hoist. - Not candidate for transplant due to substance abuse. - I/o, daily weights- CM provided scale and improtance of weighing daily. - needs to f/up at coumadin clinic (secretary will setup appt at the [facility] coumadin clinic) after d/c immediately for INR f/up. Was subtherapeutic on admission. Now therapeutic.  - has appointment at the Heart Failure clinic next week.  HTN- at home he is on coreg 6.250 BID, losartan 25mg  daily, spironolactone 12.5mg  daily, and lisinopril 2.5mg  at home (pt had been taken of ACEi by his PCP but continued taking this med).  - stopped aceI because on losartan and because of cough.  - F/up card recs: Lasix 20mg  M,W,F. Coreg 3.125mg  BID. Losartan 12.5mg  daily, spironolactone 12.5mg  daily  Chest pain: He complained of chest pain last night that resolved on its own, not associated with diaphoresis, or SOB. His EKG has no changes, troponin on presentation is negative. Ruled out MI. -protonix 40mg  daily  -Continue  monitoring  Cough: - improved. Complaints of worse cough in the past 2 weeks. Has nasal congestion with postnasal drip. Became productive of clear/yellow sputum on presentation. Could be 2/2 to bronchitis or CHF exc. Improving. -Zpack today 5/5.  Hyperlipiedemia - - cont lipitor 40 mg daily   DM II - Uncontrolled. HgA1C of 11%. He is on Lantus 32 units qHS at home with Novolog SSI.  - continue 24 lantus nightly -Continue SSI -sensitive  Left ventricular non-compaction - on Anticoguatlion.Was taking 7mg  coumadin daily. Coumadin per pharmacy. Also on 325mg  ASA at home.  - has defib placed for LV noncompaction disease.  -We did 81mg  asa daily, concern for risk of bleed with coumadin.  - continue coumadin per pharmacy. INR goal 2-3. Needs f/up outpatient for INR checks. - Stop ASA since on coumadin  Cocaine Abuse  -discussed cocaine cessation   Tobacco abuse  -  discussed cessation   Dispo: Disposition is deferred at this time, awaiting improvement of current medical problems.  Anticipated discharge in approximately 1 day(s).   The patient does have a current PCP (Minda Ditto, MD) and does need an [facility] hospital follow-up appointment after discharge.  The patient does not have transportation limitations that hinder transportation to clinic appointments.  .Services Needed at time of discharge: Y = Yes, Blank = No PT:   OT:   RN:   Equipment:   Other:     LOS: 6 days   Dellia Nims, MD 06/01/2014, 11:33 AM

## 2014-06-01 NOTE — Progress Notes (Signed)
  Date: 06/01/2014  Patient name: Dennis [name]  Medical record number: [id number]  Date of birth: 1961-01-30   This patient has been seen and the plan of care was discussed with the house staff. Please see their note for complete details. I concur with their findings. Pt is stable for D/C home today.  Bartholomew Crews, MD 06/01/2014, 11:40 AM

## 2014-06-01 NOTE — Progress Notes (Signed)
ANTICOAGULATION CONSULT NOTE   Pharmacy Consult for Coumadin Indication: atrial fibrillation  No Known Allergies  Labs:  Recent Labs  05/30/14 0506 05/31/14 0349 05/31/14 1430 06/01/14 0500  LABPROT 27.6* 28.4*  --  30.6*  INR 2.57* 2.67*  --  2.93*  CREATININE 1.04  --  1.12 1.07    Estimated Creatinine Clearance: 79.5 ml/min (by C-G formula based on Cr of 1.07).   Medical History: Past Medical History  Diagnosis Date  . CHF (congestive heart failure)   . Drug abuse and dependence   . Automatic implantable cardioverter-defibrillator in situ   . Heart murmur   . Exertional shortness of breath   . Type I diabetes mellitus   . Pneumonia ~ 1988  . Sleep apnea     "cleared after T&A"  . Cardiomyopathy, ischemic 07/21/2011  . BP (high blood pressure) 03/04/2013  . Left ventricular noncompaction 05/27/2014  . Acute on chronic systolic heart failure     Assessment: 53 year old male admitted with CHF exacerbation after cocaine abuse.  On Coumadin PTA for Afib.  INR=2.93  Goal of Therapy:  INR 2-3 Monitor platelets by anticoagulation protocol: Yes   Plan:  1) Coumadin 2.5 mg po x 1 dose at 1800 pm 2) Daily INR  Thank you. Anette Guarneri, PharmD [phone]  06/01/2014,9:53 AM

## 2014-06-01 NOTE — Discharge Instructions (Addendum)
It was a pleasure taking care of you. You were admitted to the hospital with with worsening of your heart failure. We have treated you with diuretic medicines. You should follow up with the cardiologist and also at the coumadin clinic after discharge. It's very important!  You should monitor your weight daily. Reduce salt intake.  Information on my medicine - Coumadin   (Warfarin)  This medication education was reviewed with me or my healthcare representative as part of my discharge preparation.  The pharmacist that spoke with me during my hospital stay was:  Von Nils Flack, RPH  Why was Coumadin prescribed for you? Coumadin was prescribed for you because you have a blood clot or a medical condition that can cause an increased risk of forming blood clots. Blood clots can cause serious health problems by blocking the flow of blood to the heart, lung, or brain. Coumadin can prevent harmful blood clots from forming. As a reminder your indication for Coumadin is:   Stroke Prevention Because Of Atrial Fibrillation  What test will check on my response to Coumadin? While on Coumadin (warfarin) you will need to have an INR test regularly to ensure that your dose is keeping you in the desired range. The INR (international normalized ratio) number is calculated from the result of the laboratory test called prothrombin time (PT).  If an INR APPOINTMENT HAS NOT ALREADY BEEN MADE FOR YOU please schedule an appointment to have this lab work done by your health care provider within 7 days. Your INR goal is usually a number between:  2 to 3 or your provider may give you a more narrow range like 2-2.5.  Ask your health care provider during an office visit what your goal INR is.  What  do you need to  know  About  COUMADIN? Take Coumadin (warfarin) exactly as prescribed by your healthcare provider about the same time each day.  DO NOT stop taking without talking to the doctor who prescribed the medication.   Stopping without other blood clot prevention medication to take the place of Coumadin may increase your risk of developing a new clot or stroke.  Get refills before you run out.  What do you do if you miss a dose? If you miss a dose, take it as soon as you remember on the same day then continue your regularly scheduled regimen the next day.  Do not take two doses of Coumadin at the same time.  Important Safety Information A possible side effect of Coumadin (Warfarin) is an increased risk of bleeding. You should call your healthcare provider right away if you experience any of the following:   Bleeding from an injury or your nose that does not stop.   Unusual colored urine (red or dark brown) or unusual colored stools (red or black).   Unusual bruising for unknown reasons.   A serious fall or if you hit your head (even if there is no bleeding).  Some foods or medicines interact with Coumadin (warfarin) and might alter your response to warfarin. To help avoid this:   Eat a balanced diet, maintaining a consistent amount of Vitamin K.   Notify your provider about major diet changes you plan to make.   Avoid alcohol or limit your intake to 1 drink for women and 2 drinks for men per day. (1 drink is 5 oz. wine, 12 oz. beer, or 1.5 oz. liquor.)  Make sure that ANY health care provider who prescribes medication for you  knows that you are taking Coumadin (warfarin).  Also make sure the healthcare provider who is monitoring your Coumadin knows when you have started a new medication including herbals and non-prescription products.  Coumadin (Warfarin)  Major Drug Interactions  Increased Warfarin Effect Decreased Warfarin Effect  Alcohol (large quantities) Antibiotics (esp. Septra/Bactrim, Flagyl, Cipro) Amiodarone (Cordarone) Aspirin (ASA) Cimetidine (Tagamet) Megestrol (Megace) NSAIDs (ibuprofen, naproxen, etc.) Piroxicam (Feldene) Propafenone (Rythmol SR) Propranolol (Inderal) Isoniazid  (INH) Posaconazole (Noxafil) Barbiturates (Phenobarbital) Carbamazepine (Tegretol) Chlordiazepoxide (Librium) Cholestyramine (Questran) Griseofulvin Oral Contraceptives Rifampin Sucralfate (Carafate) Vitamin K   Coumadin (Warfarin) Major Herbal Interactions  Increased Warfarin Effect Decreased Warfarin Effect  Garlic Ginseng Ginkgo biloba Coenzyme Q10 Green tea St. Johns wort    Coumadin (Warfarin) FOOD Interactions  Eat a consistent number of servings per week of foods HIGH in Vitamin K (1 serving =  cup)  Collards (cooked, or boiled & drained) Kale (cooked, or boiled & drained) Mustard greens (cooked, or boiled & drained) Parsley *serving size only =  cup Spinach (cooked, or boiled & drained) Swiss chard (cooked, or boiled & drained) Turnip greens (cooked, or boiled & drained)  Eat a consistent number of servings per week of foods MEDIUM-HIGH in Vitamin K (1 serving = 1 cup)  Asparagus (cooked, or boiled & drained) Broccoli (cooked, boiled & drained, or raw & chopped) Brussel sprouts (cooked, or boiled & drained) *serving size only =  cup Lettuce, raw (green leaf, endive, romaine) Spinach, raw Turnip greens, raw & chopped   These websites have more information on Coumadin (warfarin):  FailFactory.se; VeganReport.com.au;

## 2014-06-01 NOTE — Progress Notes (Signed)
At 1425 pt d/c to home.  All d/c instructions explained and given to pt, by charge nurse Curly Shores, [place].  Declined w/c service or staff to escort him.  Pt steady with ambulation.  Karie Kirks, Therapist, sports.

## 2014-06-01 NOTE — Progress Notes (Signed)
Pt requesting excuse from work.  Dr Genene Churn informed.  Instructed will provide one for pt.  Pt made aware.  Karie Kirks, Therapist, sports.

## 2014-06-07 ENCOUNTER — Inpatient Hospital Stay (HOSPITAL_COMMUNITY): Admit: 2014-06-07 | Payer: Medicare Other

## 2014-06-07 ENCOUNTER — Encounter (HOSPITAL_COMMUNITY): Payer: Self-pay

## 2014-06-08 ENCOUNTER — Inpatient Hospital Stay (HOSPITAL_COMMUNITY): Admit: 2014-06-08 | Payer: Medicare Other

## 2014-06-08 HISTORY — DX: Chronic systolic (congestive) heart failure: I50.22

## 2014-06-09 ENCOUNTER — Inpatient Hospital Stay (HOSPITAL_COMMUNITY): Payer: Medicare Other

## 2014-06-12 ENCOUNTER — Inpatient Hospital Stay (HOSPITAL_COMMUNITY): Admission: RE | Admit: 2014-06-12 | Payer: Medicare Other | Source: Ambulatory Visit

## 2014-06-12 ENCOUNTER — Telehealth: Payer: Self-pay | Admitting: Licensed Clinical Social Worker

## 2014-06-12 NOTE — Telephone Encounter (Signed)
CSW left message for patient to return call. Patient did not show for clinic appointment today. Raquel Sarna, [place]

## 2014-06-13 ENCOUNTER — Ambulatory Visit: Payer: Medicare Other | Admitting: Cardiology

## 2014-06-16 ENCOUNTER — Ambulatory Visit: Payer: Medicare Other | Admitting: Physician Assistant

## 2014-06-20 ENCOUNTER — Encounter (HOSPITAL_COMMUNITY): Payer: Self-pay | Admitting: Anesthesiology

## 2014-06-20 ENCOUNTER — Ambulatory Visit (HOSPITAL_COMMUNITY)
Admission: RE | Admit: 2014-06-20 | Discharge: 2014-06-20 | Disposition: A | Discharge status: Home / Self Care | Payer: Medicare Other | Source: Ambulatory Visit | Attending: Internal Medicine | Admitting: Internal Medicine

## 2014-06-20 ENCOUNTER — Encounter: Payer: Self-pay | Admitting: Licensed Clinical Social Worker

## 2014-06-20 ENCOUNTER — Telehealth (HOSPITAL_COMMUNITY): Payer: Self-pay | Admitting: Vascular Surgery

## 2014-06-20 ENCOUNTER — Encounter (HOSPITAL_COMMUNITY): Payer: Self-pay

## 2014-06-20 ENCOUNTER — Ambulatory Visit (INDEPENDENT_AMBULATORY_CARE_PROVIDER_SITE_OTHER): Payer: Medicare Other | Admitting: Pharmacist

## 2014-06-20 VITALS — BP 100/64 | HR 104 | Wt 157.0 lb

## 2014-06-20 DIAGNOSIS — Z7901 Long term (current) use of anticoagulants: Secondary | ICD-10-CM | POA: Diagnosis not present

## 2014-06-20 DIAGNOSIS — E119 Type 2 diabetes mellitus without complications: Secondary | ICD-10-CM

## 2014-06-20 DIAGNOSIS — I428 Other cardiomyopathies: Secondary | ICD-10-CM

## 2014-06-20 DIAGNOSIS — F17200 Nicotine dependence, unspecified, uncomplicated: Secondary | ICD-10-CM

## 2014-06-20 DIAGNOSIS — I2589 Other forms of chronic ischemic heart disease: Secondary | ICD-10-CM | POA: Diagnosis not present

## 2014-06-20 DIAGNOSIS — IMO0002 Reserved for concepts with insufficient information to code with codable children: Secondary | ICD-10-CM | POA: Insufficient documentation

## 2014-06-20 DIAGNOSIS — E118 Type 2 diabetes mellitus with unspecified complications: Secondary | ICD-10-CM

## 2014-06-20 DIAGNOSIS — I2510 Atherosclerotic heart disease of native coronary artery without angina pectoris: Secondary | ICD-10-CM | POA: Insufficient documentation

## 2014-06-20 DIAGNOSIS — I255 Ischemic cardiomyopathy: Secondary | ICD-10-CM

## 2014-06-20 DIAGNOSIS — E1165 Type 2 diabetes mellitus with hyperglycemia: Secondary | ICD-10-CM | POA: Diagnosis not present

## 2014-06-20 DIAGNOSIS — F1920 Other psychoactive substance dependence, uncomplicated: Secondary | ICD-10-CM | POA: Diagnosis not present

## 2014-06-20 DIAGNOSIS — I5042 Chronic combined systolic (congestive) and diastolic (congestive) heart failure: Secondary | ICD-10-CM | POA: Insufficient documentation

## 2014-06-20 DIAGNOSIS — I509 Heart failure, unspecified: Secondary | ICD-10-CM | POA: Diagnosis not present

## 2014-06-20 DIAGNOSIS — I5022 Chronic systolic (congestive) heart failure: Secondary | ICD-10-CM | POA: Diagnosis not present

## 2014-06-20 HISTORY — DX: Ischemic cardiomyopathy: I25.5

## 2014-06-20 LAB — BASIC METABOLIC PANEL
Anion gap: 12 (ref 5–15)
BUN: 13 mg/dL (ref 6–23)
CO2: 26 mEq/L (ref 19–32)
Calcium: 8.6 mg/dL (ref 8.4–10.5)
Chloride: 101 mEq/L (ref 96–112)
Creatinine, Ser: 1.04 mg/dL (ref 0.50–1.35)
GFR calc Af Amer: >90 mL/min (ref >90)
GFR calc non Af Amer: 81 mL/min — ABNORMAL LOW (ref >90)
Glucose, Bld: 330 mg/dL — ABNORMAL HIGH (ref 70–99)
Potassium: 3.9 mEq/L (ref 3.7–5.3)
Sodium: 139 mEq/L (ref 137–147)

## 2014-06-20 LAB — POCT INR: INR: 1.3

## 2014-06-20 MED ORDER — FUROSEMIDE 20 MG PO TABS: ORAL_TABLET | ORAL | Status: DC

## 2014-06-20 MED ORDER — LOSARTAN POTASSIUM 25 MG PO TABS: 12.5000 mg | ORAL_TABLET | Freq: Every day | ORAL | Status: DC

## 2014-06-20 NOTE — Telephone Encounter (Signed)
Spoke w/Dennis [name], pt has insurance

## 2014-06-20 NOTE — Telephone Encounter (Signed)
[facility] called to clarify billing for pt medication.. Is the pt med under the match program, Insurance or cash.. Please advise

## 2014-06-20 NOTE — Patient Instructions (Signed)
Will restart your lasix 20 mg on Mondays, Wednesdays and Fridays.  Start losartan 12.5 mg daily.   Go to [facility] 3rd floor to get your coumadin checked.  Go to [facility] for medications.   Follow up in 2 weeks.  Do the following things EVERYDAY: 1) Weigh yourself in the morning before breakfast. Write it down and keep it in a log. 2) Take your medicines as prescribed 3) Eat low salt foods-Limit salt (sodium) to 2000 mg per day.  4) Stay as active as you can everyday 5) Limit all fluids for the day to less than 2 liters 6)

## 2014-06-20 NOTE — Progress Notes (Addendum)
Patient ID: Dennis [name], male   DOB: 02-Aug-1961, 53 y.o.   MRN: [id number] PCP: N/A Primary Cardiologist:: previously Dr. Erline Levine [facility])  HPI: 53 y/o male with h/o DM2, polysubstance use (tobacco, cocaine), mixed ICM/NICM and chronic systolic HF due to ventricular noncompaction and possible ischemic heart disease (reportedly had a collateralized LAD on last cath).   ECHO EF 20-25%. RV ok. (8/2015[facility] Follow up: Has missed multiple appointments since discharge. Reports patient has been out of losartan, spiro and lasix since Thursday. Denies CP, orthopnea, PND or LE edema. Weight at home has been 153-154 lbs. Following a low salt diet and drinking less than 2L a day. Able to walk about 1/2 mile without stopping or having issues. Minimal SOB with going up hills. Has not had coumadin checked.   ROS: All systems negative except as listed in HPI, PMH and Problem List.  SH:  History   Social History  . Marital Status: Single    Spouse Name: N/A    Number of Children: 1  . Years of Education: N/A   Occupational History  . Disabled    Social History Main Topics  . Smoking status: Former Smoker -- 0.50 packs/day for 35 years    Types: Cigarettes    Quit date: 05/24/2014  . Smokeless tobacco: Never Used  . Alcohol Use: Yes     Comment: 05/26/2014 "might have a drink maybe once/month, if that"  . Drug Use: Yes    Special: Cocaine     Comment: Reports he is no longer using cocaine.   . Sexual Activity: Yes   Other Topics Concern  . Not on file   Social History Narrative   Lives in [place] with his parents.     FH:  Family History  Problem Relation Age of Onset  . Diabetes Mother   . Heart disease Mother   . Diabetes Father   . Prostate cancer Father   . Heart disease Father   . Diabetes Brother   . Diabetes Brother     Past Medical History  Diagnosis Date  . Chronic systolic heart failure     a) Mixed ICM/NICM b) RHC (05/2014): RA 2, RV 19/2/3, PA 22/14 (18),  PCWP 6, Fick CO/CI: 5.2 / 2.7, PVR 2.3 WU, PA 60% and 64% c) ECHO (05/2014): EF 20-25%, diff HK, akinesis entireanteroseptal myocardium, triv AI, mod MR, LA mod/sev dilated  . Drug abuse and dependence   . Automatic implantable cardioverter-defibrillator in situ   . Heart murmur   . Diabetes mellitus type 1   . Sleep apnea     "cleared after T&A"  . HTN (hypertension) 03/04/2013  . Left ventricular noncompaction   . Ischemic cardiomyopathy     a) Coronary angiography (12/2008) at [facility]: Lmain: nl, LAD mid 100% stenosis with left to left and right-to-left collaterals to the distal LAD; Lcx: nl, RCA nl.      Current Outpatient Prescriptions  Medication Sig Dispense Refill  . carvedilol (COREG) 3.125 MG tablet Take 3.125 mg by mouth 2 (two) times daily with a meal.      . insulin glargine (LANTUS) 100 UNIT/ML injection Inject 32 Units into the skin at bedtime.      . QC PEN NEEDLES 31G X 6 MM MISC 1 each by Other route See admin instructions. Use daily with insulin.      . simvastatin (ZOCOR) 80 MG tablet Take 80 mg by mouth at bedtime.      Marland Kitchen warfarin (  COUMADIN) 2 MG tablet Take 1 tablet (2 mg total) by mouth daily.  30 tablet  0  . warfarin (COUMADIN) 5 MG tablet Take 1 tablet (5 mg total) by mouth daily.  30 tablet  0  . furosemide (LASIX) 20 MG tablet Take 1 tablet (20 mg total) by mouth every Monday, Wednesday, and Friday.  30 tablet  0  . losartan (COZAAR) 25 MG tablet Take 0.5 tablets (12.5 mg total) by mouth daily.  30 tablet  0  . spironolactone (ALDACTONE) 12.5 mg TABS tablet Take 0.5 tablets (12.5 mg total) by mouth daily.  30 tablet  0   No current facility-administered medications for this encounter.    Filed Vitals:   06/20/14 0925  BP: 100/64  Pulse: 104  Weight: 157 lb (71.215 kg)  SpO2: 98%    PHYSICAL EXAM: General:  Well appearing. No resp difficulty HEENT: normal Neck: supple. JVP 7. Carotids 2+ bilaterally; no bruits. No lymphadenopathy or thryomegaly  appreciated. Cor: PMI normal. Tachy rate & rhythm. No rubs, gallops or murmurs. Lungs: clear Abdomen: soft, nontender, nondistended. No hepatosplenomegaly. No bruits or masses. Good bowel sounds. Extremities: no cyanosis, clubbing, rash, edema Neuro: alert & orientedx3, cranial nerves grossly intact. Moves all 4 extremities w/o difficulty. Affect pleasant.   ASSESSMENT & PLAN:  1) Chronic systolic HF: Mixed ICM/NICM s/p ICD, EF 20-25%, mod MR (05/2014) - Patient has missed multiple appointments and ran out of medications about 4-5 days ago. Lengthy discussion with patietn about the need to follow up and call if he is getting low on medications.  - NYHA II symptoms and volume status stable. Will restart lasix 20 mg on Mondays, Wednesdays and Fridays. - Will restart losartan 12.5 mg daily and check BMET today and in 7-14 days.  - Hold off on restarting spironolactone currently with low BP. If SBP stable next visit then will try to restart.  - Patient will eventually need CPX test to assess cardiac limitations, however will hold off currently to make sure patient will follow up. Currently if he needed advanced therapies he would not be a candidate d/t recent hx of drug use and non-compliance.  - Will need to confirm with patient what type of device patient has and get him enrolled with EP next visit. Patient would like to change from Cardiologist in [place] to here.  - Reinforced the need and importance of daily weights, a low sodium diet, and fluid restriction (less than 2 L a day). Instructed to call the HF clinic if weight increases more than 3 lbs overnight or 5 lbs in a week.  2) CAD:  - Coronary angiography (12/2008): 12/2008) Coronary angiography: LAD collateralized. Continue statin, BB and ARB. 3) L ventricular non-compaction - On coumadin. Set patient up for [facility] for INR checking today. Instructed patient to go there after appointment to get INR checked. 4) DM2 - Uncontrolled. Hgb  A1C 11%. Will refer to CSW to try and help get referral for PCP.  5) Tobacco and drug abuse: - He is still currently smoking and encouraged to try and quit and reports he has not used any cocaine in awhile. Congratulated him on no cocaine and encouraged to abstain.   Spent 40 minutes with over 1/2 that time discussing the above and coordinating care.   F/U 2 weeks. Junie Bame B NP-C

## 2014-06-20 NOTE — Progress Notes (Signed)
CSW referred to assist with finances and PCP. Patient reports that he receives $1,100 Social Security Disability and has Medicare. Patient states that he was recent hospitalized and has some unpaid medical bills. CSW suggested that he explore medicaid for help with payment of the unpaid bills. CSW contacted [facility] at [place] to obtain a PCP. Patient has an appointment on July 21, 2014 at 8:45am. CSW provided patient with number for medicaid as well as information on PCP appointment at [facility] Patient verbalizes understanding of follow up needed. CSW available if further needs arise. Alexis Frock [phone]

## 2014-06-22 ENCOUNTER — Telehealth (HOSPITAL_COMMUNITY): Payer: Self-pay | Admitting: Vascular Surgery

## 2014-07-21 ENCOUNTER — Ambulatory Visit: Payer: Medicare Other | Admitting: Family

## 2014-07-21 DIAGNOSIS — Z0289 Encounter for other administrative examinations: Secondary | ICD-10-CM

## 2014-07-28 ENCOUNTER — Encounter (HOSPITAL_COMMUNITY): Payer: Medicare Other

## 2014-07-31 NOTE — Telephone Encounter (Signed)
Recall? 

## 2014-08-13 ENCOUNTER — Encounter (HOSPITAL_COMMUNITY): Payer: Self-pay | Admitting: Family Medicine

## 2014-08-13 ENCOUNTER — Emergency Department (HOSPITAL_COMMUNITY): Payer: Medicare Other

## 2014-08-13 ENCOUNTER — Emergency Department (HOSPITAL_COMMUNITY)
Admission: EM | Admit: 2014-08-13 | Discharge: 2014-08-14 | Disposition: A | Discharge status: Home / Self Care | Payer: Medicare Other | Attending: Emergency Medicine | Admitting: Emergency Medicine

## 2014-08-13 DIAGNOSIS — Z87891 Personal history of nicotine dependence: Secondary | ICD-10-CM | POA: Insufficient documentation

## 2014-08-13 DIAGNOSIS — R05 Cough: Secondary | ICD-10-CM | POA: Diagnosis not present

## 2014-08-13 DIAGNOSIS — Z7901 Long term (current) use of anticoagulants: Secondary | ICD-10-CM | POA: Insufficient documentation

## 2014-08-13 DIAGNOSIS — I5022 Chronic systolic (congestive) heart failure: Secondary | ICD-10-CM | POA: Insufficient documentation

## 2014-08-13 DIAGNOSIS — R0602 Shortness of breath: Secondary | ICD-10-CM | POA: Insufficient documentation

## 2014-08-13 DIAGNOSIS — Z79899 Other long term (current) drug therapy: Secondary | ICD-10-CM | POA: Diagnosis not present

## 2014-08-13 DIAGNOSIS — Z95810 Presence of automatic (implantable) cardiac defibrillator: Secondary | ICD-10-CM | POA: Insufficient documentation

## 2014-08-13 DIAGNOSIS — E109 Type 1 diabetes mellitus without complications: Secondary | ICD-10-CM | POA: Insufficient documentation

## 2014-08-13 DIAGNOSIS — R011 Cardiac murmur, unspecified: Secondary | ICD-10-CM | POA: Diagnosis not present

## 2014-08-13 DIAGNOSIS — I10 Essential (primary) hypertension: Secondary | ICD-10-CM | POA: Diagnosis not present

## 2014-08-13 DIAGNOSIS — Z794 Long term (current) use of insulin: Secondary | ICD-10-CM | POA: Insufficient documentation

## 2014-08-13 LAB — BASIC METABOLIC PANEL
Anion gap: 11 (ref 5–15)
BUN: 15 mg/dL (ref 6–23)
CO2: 28 mEq/L (ref 19–32)
Calcium: 9.3 mg/dL (ref 8.4–10.5)
Chloride: 105 mEq/L (ref 96–112)
Creatinine, Ser: 1.02 mg/dL (ref 0.50–1.35)
GFR calc Af Amer: >90 mL/min (ref >90)
GFR calc non Af Amer: 83 mL/min — ABNORMAL LOW (ref >90)
Glucose, Bld: 184 mg/dL — ABNORMAL HIGH (ref 70–99)
Potassium: 4.4 mEq/L (ref 3.7–5.3)
Sodium: 144 mEq/L (ref 137–147)

## 2014-08-13 LAB — PROTIME-INR
INR: 1.12 (ref 0.00–1.49)
Prothrombin Time: 14.5 seconds (ref 11.6–15.2)

## 2014-08-13 LAB — CBC
HCT: 41.9 % (ref 39.0–52.0)
Hemoglobin: 13.7 g/dL (ref 13.0–17.0)
MCH: 30.5 pg (ref 26.0–34.0)
MCHC: 32.7 g/dL (ref 30.0–36.0)
MCV: 93.3 fL (ref 78.0–100.0)
Platelets: 244 10*3/uL (ref 150–400)
RBC: 4.49 MIL/uL (ref 4.22–5.81)
RDW: 13 % (ref 11.5–15.5)
WBC: 8.7 10*3/uL (ref 4.0–10.5)

## 2014-08-13 LAB — I-STAT TROPONIN, ED: Troponin i, poc: 0.02 ng/mL (ref 0.00–0.08)

## 2014-08-13 LAB — PRO B NATRIURETIC PEPTIDE: Pro B Natriuretic peptide (BNP): 1525 pg/mL — ABNORMAL HIGH (ref 0–125)

## 2014-08-13 IMAGING — CR DG CHEST 2V
2 series · 2 of 2 positions shown · non-contrast
Comparison: [DATE]

CLINICAL DATA: Productive cough for 2 days and difficulty breathing

EXAM:
CHEST  2 VIEW

[w chest pa]
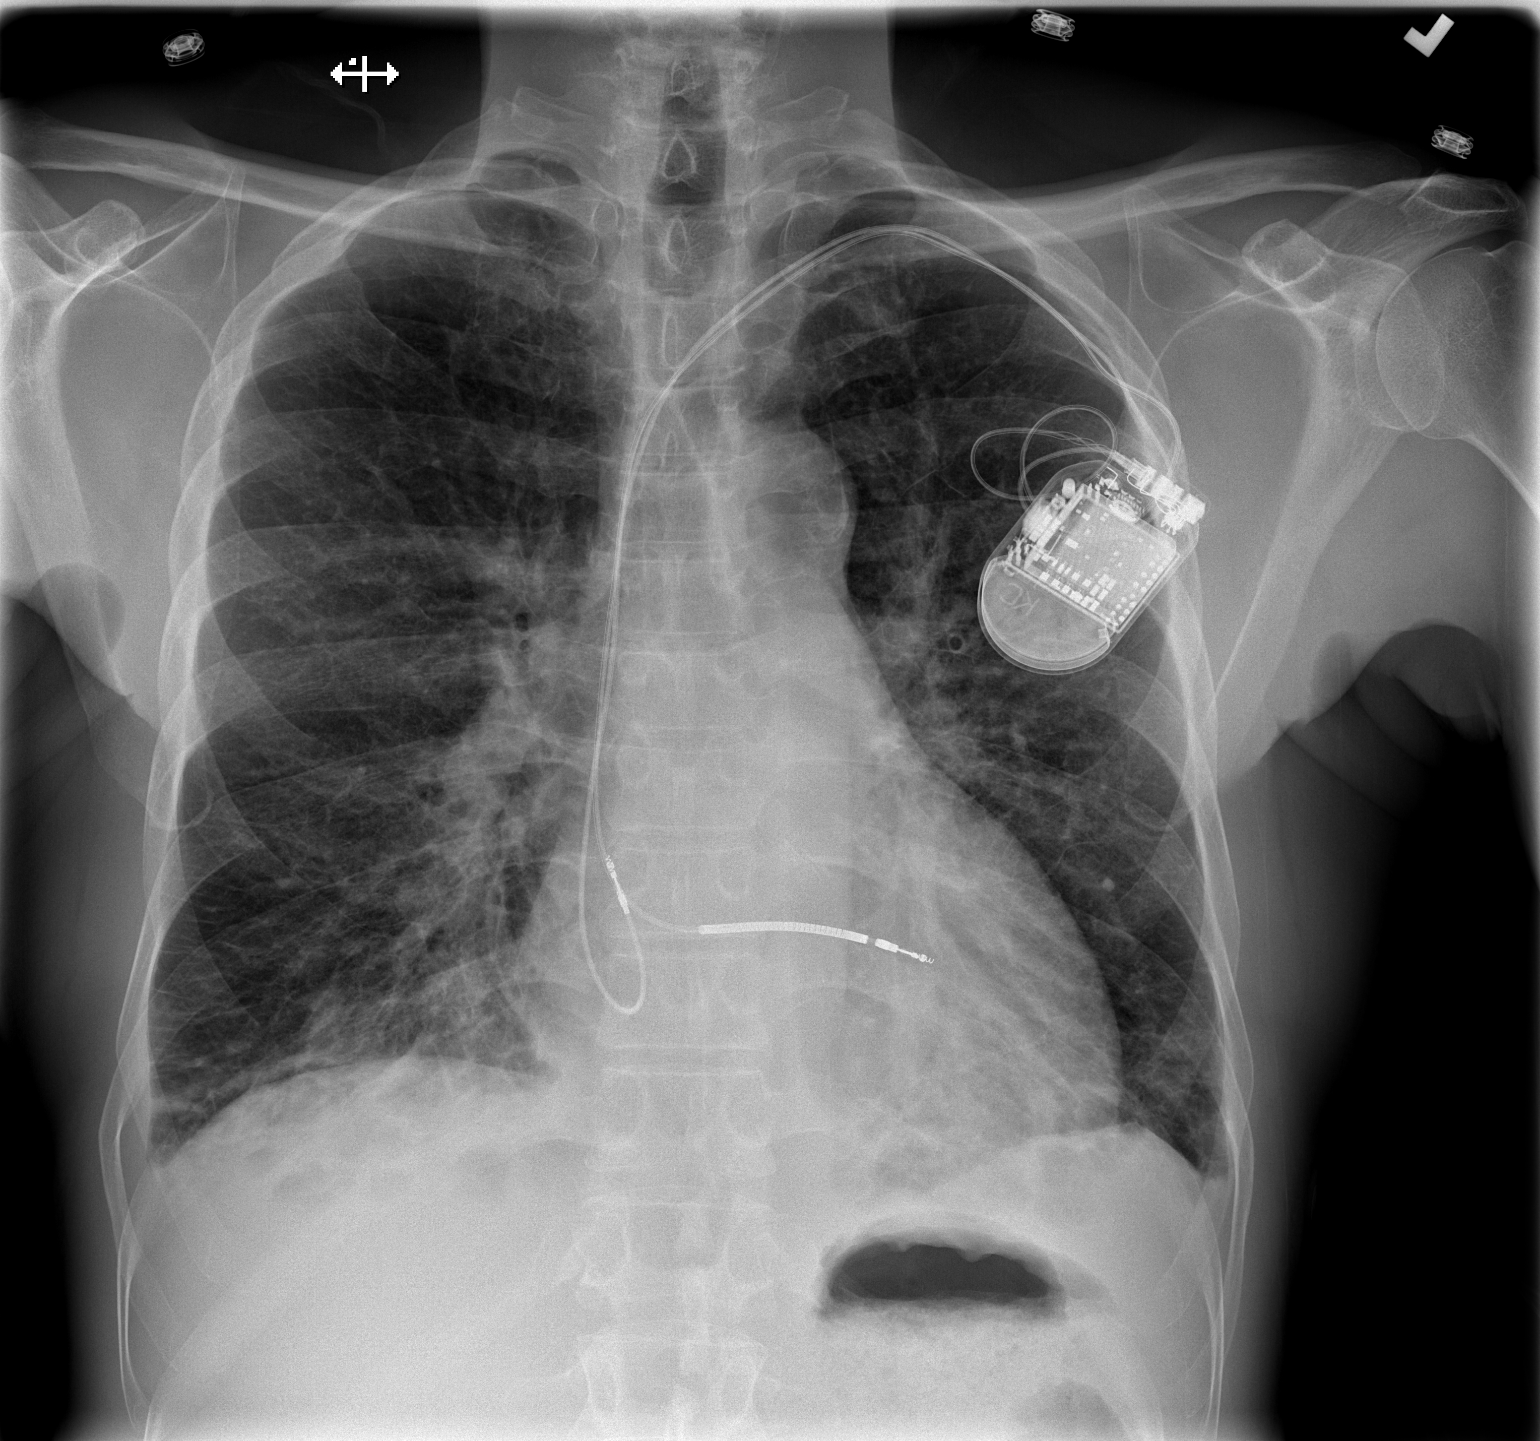

[w chest lat]
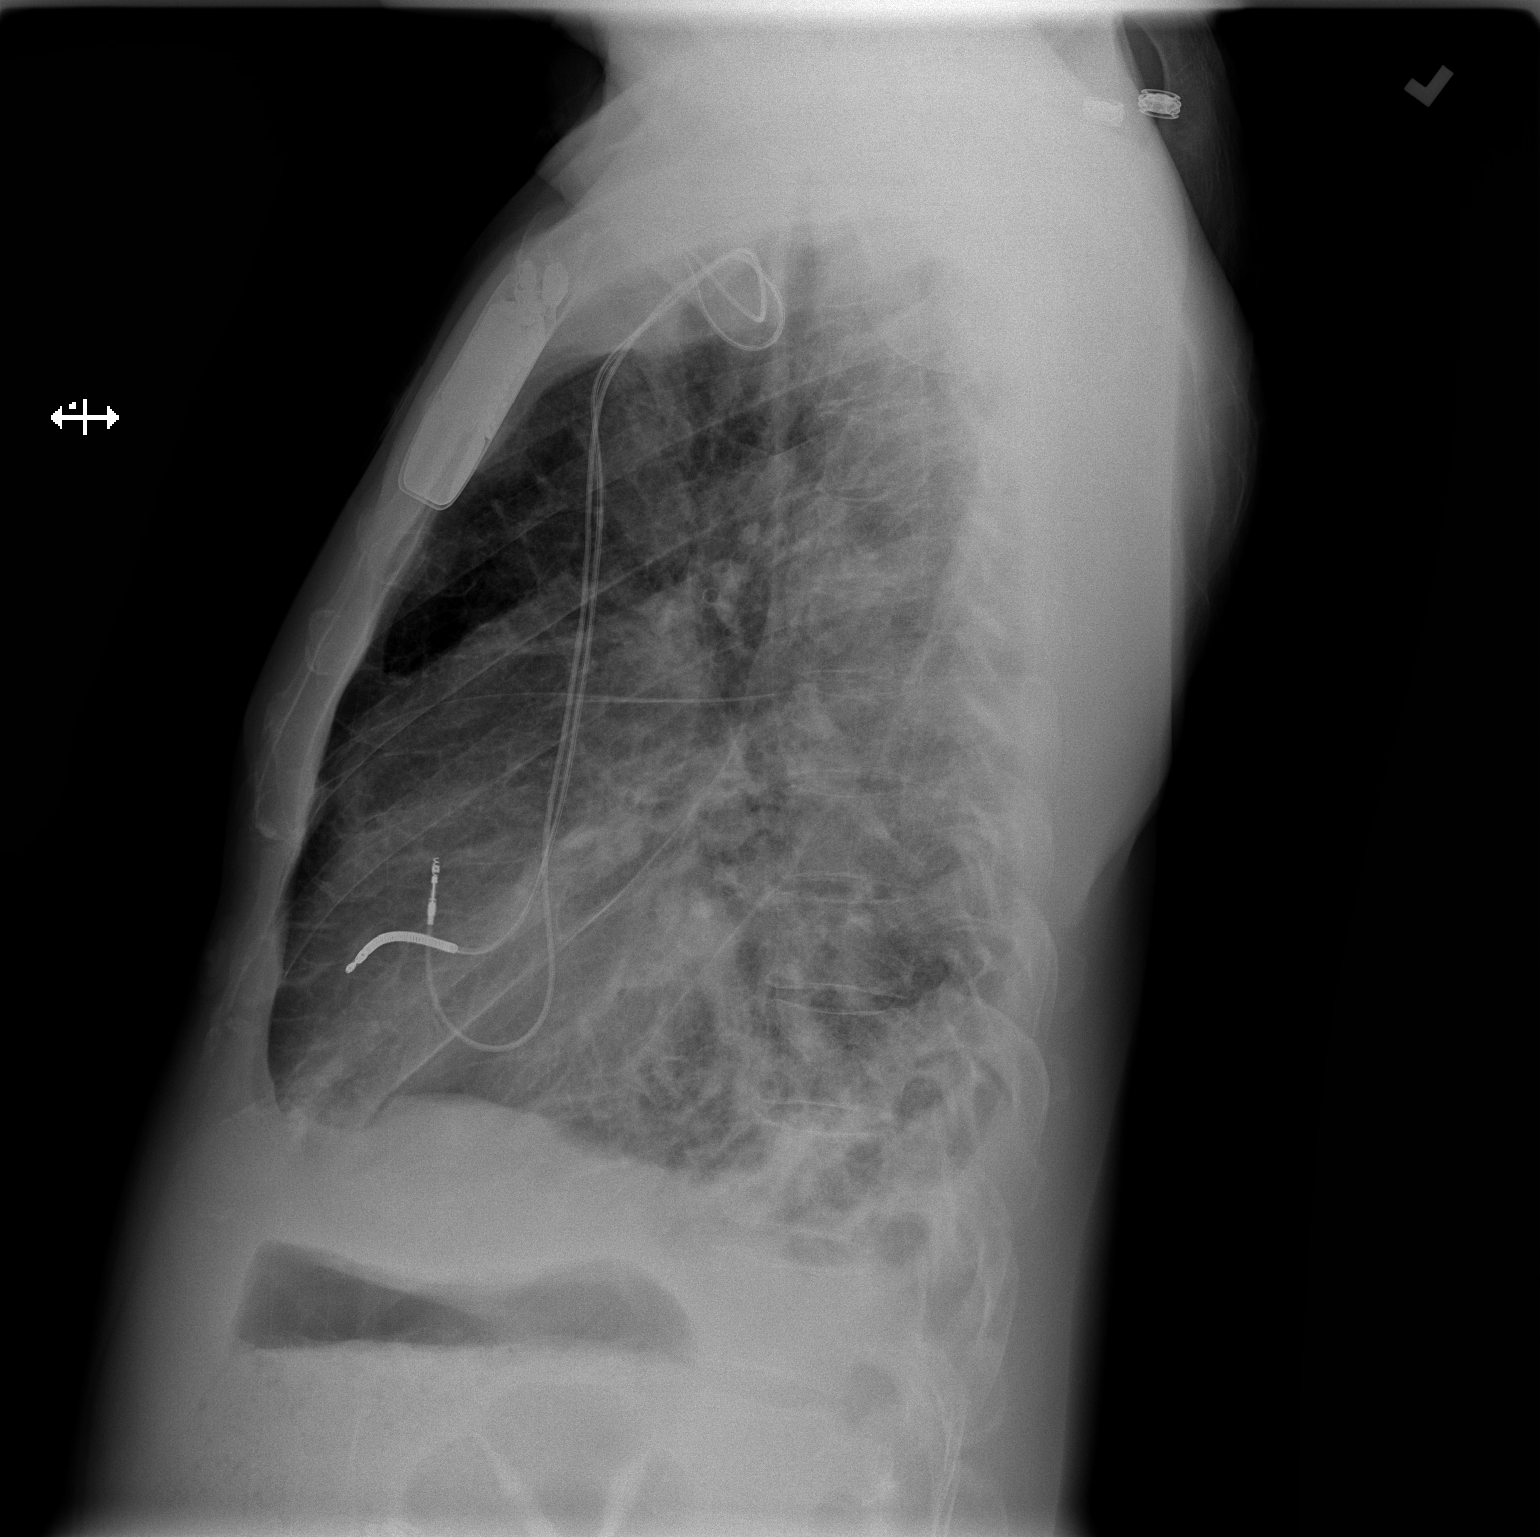

[2 of 2 positions shown; findings below may reference images not displayed]

FINDINGS: There is stable interstitial fibrosis in the lung bases. There is no
frank edema or consolidation. The heart is upper normal in size with
pulmonary vascularity within normal limits. Pacemaker leads are
attached to the right atrium and right ventricle stable. No
adenopathy. No bone lesions.
IMPRESSION: Stable interstitial fibrosis. No frank edema or consolidation. No
new opacity compared to prior study

## 2014-08-13 MED ORDER — FUROSEMIDE 20 MG PO TABS
20.0000 mg | ORAL_TABLET | Freq: Once | ORAL | Status: AC
  Administered 2014-08-13: 20 mg via ORAL
  Filled 2014-08-13: qty 1

## 2014-08-13 NOTE — ED Notes (Signed)
Per pt sts cough, SOB since Friday. sts productive and cant lay flat.

## 2014-08-13 NOTE — ED Provider Notes (Signed)
CSN: [id number]     Arrival date & time 08/13/14  1826 History   First MD Initiated Contact with Patient 08/13/14 1920     Chief Complaint  Patient presents with  . Shortness of Breath  . Cough     (Consider location/radiation/quality/duration/timing/severity/associated sxs/prior Treatment) HPI Dennis [name] is a 53 y.o. male with a history of chronic systolic heart failure recently discharged on August 20 for an acute exacerbation of his heart failure. Cardiologist is Dr. Marzetta Board. Patient admits he has not taken any of his medications for the past 2 weeks due to financial constraints--Will be able to refill all of his medications on Tuesday the 3rd. He does report however taking his insulin. He reports having the same symptoms of his congestive heart failure but over the past 2 days he has had a productive cough with clear to whitish sputum. Denies hemoptysis. He also reports orthopnea since yesterday. Denies fevers, headache, changes in vision, chest pain, abdominal pain, numbness or weakness  Past Medical History  Diagnosis Date  . Chronic systolic heart failure     a) Mixed ICM/NICM b) RHC (05/2014): RA 2, RV 19/2/3, PA 22/14 (18), PCWP 6, Fick CO/CI: 5.2 / 2.7, PVR 2.3 WU, PA 60% and 64% c) ECHO (05/2014): EF 20-25%, diff HK, akinesis entireanteroseptal myocardium, triv AI, mod MR, LA mod/sev dilated  . Drug abuse and dependence   . Automatic implantable cardioverter-defibrillator in situ   . Heart murmur   . Diabetes mellitus type 1   . Sleep apnea     "cleared after T&A"  . HTN (hypertension) 03/04/2013  . Left ventricular noncompaction   . Ischemic cardiomyopathy     a) Coronary angiography (12/2008) at [facility]: Lmain: nl, LAD mid 100% stenosis with left to left and right-to-left collaterals to the distal LAD; Lcx: nl, RCA nl.     Past Surgical History  Procedure Laterality Date  . Forearm fracture surgery Left 1980  . Cardiac defibrillator placement  03/2011  . Cholecystectomy  1994   . Tonsillectomy and adenoidectomy  ~ 1998   Family History  Problem Relation Age of Onset  . Diabetes Mother   . Heart disease Mother   . Diabetes Father   . Prostate cancer Father   . Heart disease Father   . Diabetes Brother   . Diabetes Brother    History  Substance Use Topics  . Smoking status: Former Smoker -- 0.50 packs/day for 35 years    Types: Cigarettes    Quit date: 05/24/2014  . Smokeless tobacco: Never Used  . Alcohol Use: Yes     Comment: 05/26/2014 "might have a drink maybe once/month, if that"    Review of Systems    Allergies  Review of patient's allergies indicates no known allergies.  Home Medications   Prior to Admission medications   Medication Sig Start Date End Date Taking? Authorizing Provider  carvedilol (COREG) 3.125 MG tablet Take 3.125 mg by mouth 2 (two) times daily with a meal.    Historical Provider, MD  furosemide (LASIX) 20 MG tablet Or as instructed by provider. 06/20/14   Rande Brunt, NP  insulin glargine (LANTUS) 100 UNIT/ML injection Inject 32 Units into the skin at bedtime.    Historical Provider, MD  losartan (COZAAR) 25 MG tablet Take 0.5 tablets (12.5 mg total) by mouth daily. 06/20/14   Rande Brunt, NP  QC PEN NEEDLES 31G X 6 MM MISC 1 each by Other route See admin instructions. Use daily  with insulin. 08/06/12   Historical Provider, MD  simvastatin (ZOCOR) 80 MG tablet Take 80 mg by mouth at bedtime.    Historical Provider, MD  spironolactone (ALDACTONE) 12.5 mg TABS tablet Take 0.5 tablets (12.5 mg total) by mouth daily. 06/01/14   Dellia Nims, MD  warfarin (COUMADIN) 2 MG tablet Take 1 tablet (2 mg total) by mouth daily. 06/01/14   Dellia Nims, MD  warfarin (COUMADIN) 5 MG tablet Take 1 tablet (5 mg total) by mouth daily. 06/01/14   Tasrif Ahmed, MD   BP 159/69 mmHg  Pulse 115  Temp(Src) 98 F (36.7 C) (Oral)  Resp 18  Ht 6' (1.829 m)  Wt 155 lb (70.308 kg)  BMI 21.02 kg/m2  SpO2 94% Physical Exam  Constitutional: He  is oriented to person, place, and time. He appears well-developed and well-nourished.  HENT:  Head: Normocephalic and atraumatic.  Mouth/Throat: Oropharynx is clear and moist.  Eyes: Conjunctivae are normal. Pupils are equal, round, and reactive to light. Right eye exhibits no discharge. Left eye exhibits no discharge. No scleral icterus.  Neck: Neck supple.  Cardiovascular: Regular rhythm and normal heart sounds.  Exam reveals no gallop and no friction rub.   No murmur heard. Pulmonary/Chest: Breath sounds normal. No respiratory distress. He has no wheezes. He has no rales.  Abdominal: Soft. There is no tenderness.  Musculoskeletal: He exhibits no edema or tenderness.  Neurological: He is alert and oriented to person, place, and time.  Cranial Nerves II-XII grossly intact  Skin: Skin is warm and dry. No rash noted.  Psychiatric: He has a normal mood and affect.  Nursing note and vitals reviewed.   ED Course  Procedures (including critical care time) Labs Review Labs Reviewed  [place], ED    Imaging Review Dg Chest 2 View  08/13/2014   CLINICAL DATA:  Productive cough for 2 days and difficulty breathing  EXAM: CHEST  2 VIEW  COMPARISON:  May 27, 2014  FINDINGS: There is stable interstitial fibrosis in the lung bases. There is no frank edema or consolidation. The heart is upper normal in size with pulmonary vascularity within normal limits. Pacemaker leads are attached to the right atrium and right ventricle stable. No adenopathy. No bone lesions.  IMPRESSION: Stable interstitial fibrosis. No frank edema or consolidation. No new opacity compared to prior study   Electronically Signed   By: Lowella Grip M.D.   On: 08/13/2014 20:01     EKG Interpretation None     Meds given in ED:  Medications  furosemide (LASIX) tablet 20 mg (20 mg Oral Given 08/13/14 2112)    New Prescriptions   No  medications on file   Filed Vitals:   08/13/14 2154 08/13/14 2230 08/13/14 2245 08/13/14 2321  BP: 114/73 104/64 114/78 113/76  Pulse: 113 114 119 110  Temp:      TempSrc:      Resp: 24 37 26 26  Height:      Weight:      SpO2: 94% 92% 94% 92%    MDM  Vitals stable  -afebrile. Pt has persistent tachycardia and tachypnea, likely nonspecific and due to poor medication compliance. Pt resting comfortably in ED. Received home dose of furosemide in ED. Patient states he will be able to refill all of his prescriptions on Tuesday 3rd. PE no evidence of fluid overload, patient looks well Labwork noncontributory Imaging --chest x-ray shows  no acute cardiopulmonary pathology. Stable interstitial fibrosis, pacemaker leads stable. Doubt other acute or emergent pathology.  Reviewed case with Dr. Elias Else (Cardiology) and he felt it was reasonable to have patient follow-up in office and stressed importance of medication compliance and smoking cessation.  Discussed f/u with PCP and return precautions, pt very amenable to plan. Patient stable, in good condition and is appropriate for discharge.  Prior to patient discharge, I discussed and reviewed this case with Dr.Harrison who also saw the patient.   Final diagnoses:  SOB (shortness of breath)        Verl Dicker, PA-C 08/14/14 1146

## 2014-08-13 NOTE — ED Notes (Addendum)
Has not been able to take any of his meds in two weeks because he could not afford them. Has known hx. Of CHF.

## 2014-08-14 ENCOUNTER — Telehealth (HOSPITAL_COMMUNITY): Payer: Self-pay | Admitting: Vascular Surgery

## 2014-08-14 NOTE — Telephone Encounter (Signed)
Attempted to call pt and Left message to call back

## 2014-08-14 NOTE — Telephone Encounter (Signed)
Pt called he is very SOB .Marland Kitchen He went ED last night he was D/C.Marland Kitchen He is on his back to the ED he has been SOB since Saturday .Marland Kitchen He wanted to let the office know.

## 2014-08-14 NOTE — Discharge Instructions (Signed)
Shortness of Breath Shortness of breath means you have trouble breathing. It could also mean that you have a medical problem. You should get immediate medical care for shortness of breath. CAUSES   Not enough oxygen in the air such as with high altitudes or a smoke-filled room.  Certain lung diseases, infections, or problems.  Heart disease or conditions, such as angina or heart failure.  Low red blood cells (anemia).  Poor physical fitness, which can cause shortness of breath when you exercise.  Chest or back injuries or stiffness.  Being overweight.  Smoking.  Anxiety, which can make you feel like you are not getting enough air. DIAGNOSIS  Serious medical problems can often be found during your physical exam. Tests may also be done to determine why you are having shortness of breath. Tests may include:  Chest X-rays.  Lung function tests.  Blood tests.  An electrocardiogram (ECG).  An ambulatory electrocardiogram. An ambulatory ECG records your heartbeat patterns over a 24-hour period.  Exercise testing.  A transthoracic echocardiogram (TTE). During echocardiography, sound waves are used to evaluate how blood flows through your heart.  A transesophageal echocardiogram (TEE).  Imaging scans. Your health care provider may not be able to find a cause for your shortness of breath after your exam. In this case, it is important to have a follow-up exam with your health care provider as directed.  TREATMENT  Treatment for shortness of breath depends on the cause of your symptoms and can vary greatly. HOME CARE INSTRUCTIONS   Do not smoke. Smoking is a common cause of shortness of breath. If you smoke, ask for help to quit.  Avoid being around chemicals or things that may bother your breathing, such as paint fumes and dust.  Rest as needed. Slowly resume your usual activities.  If medicines were prescribed, take them as directed for the full length of time directed. This  includes oxygen and any inhaled medicines.  Keep all follow-up appointments as directed by your health care provider. SEEK MEDICAL CARE IF:   Your condition does not improve in the time expected.  You have a hard time doing your normal activities even with rest.  You have any new symptoms. SEEK IMMEDIATE MEDICAL CARE IF:   Your shortness of breath gets worse.  You feel light-headed, faint, or develop a cough not controlled with medicines.  You start coughing up blood.  You have pain with breathing.  You have chest pain or pain in your arms, shoulders, or abdomen.  You have a fever.  You are unable to walk up stairs or exercise the way you normally do. MAKE SURE YOU:  Understand these instructions.  Will watch your condition.  Will get help right away if you are not doing well or get worse. Document Released: 06/24/2001 Document Revised: 10/04/2013 Document Reviewed: 12/15/2011 [facility] Patient Information 2015 [place], [place]. This information is not intended to replace advice given to you by your health care provider. Make sure you discuss any questions you have with your health care provider.    Emergency Department Resource Guide 1) Find a Doctor and Pay Out of Pocket Although you won't have to find out who is covered by your insurance plan, it is a good idea to ask around and get recommendations. You will then need to call the office and see if the doctor you have chosen will accept you as a new patient and what types of options they offer for patients who are self-pay. Some doctors  offer discounts or will set up payment plans for their patients who do not have insurance, but you will need to ask so you aren't surprised when you get to your appointment.  2) Contact Your Local Health Department Not all health departments have doctors that can see patients for sick visits, but many do, so it is worth a call to see if yours does. If you don't know where your local health  department is, you can check in your phone book. The CDC also has a tool to help you locate your state's health department, and many state websites also have listings of all of their local health departments.  3) Find a [facility] If your illness is not likely to be very severe or complicated, you may want to try a walk in clinic. These are popping up all over the country in pharmacies, drugstores, and shopping centers. They're usually staffed by nurse practitioners or physician assistants that have been trained to treat common illnesses and complaints. They're usually fairly quick and inexpensive. However, if you have serious medical issues or chronic medical problems, these are probably not your best option.  No Primary Care Doctor: - Call Health Connect at  [phone] - they can help you locate a primary care doctor that  accepts your insurance, provides certain services, etc. - Physician Referral Service- [phone]  Chronic Pain Problems: Organization         Address  Phone   Notes  [facility]  [phone] Patients need to be referred by their primary care doctor.   Medication Assistance: Organization         Address  Phone   Notes  [facility] Medication [facility] [place], [place] [address] [phone] --Must be a resident of [facility] -- Must have NO insurance coverage whatsoever (no Medicaid/ Medicare, etc.) -- The pt. MUST have a primary care doctor that directs their care regularly and follows them in the community   MedAssist  [phone]   [employer]  [phone]    Agencies that provide inexpensive medical care: Organization         Address  Phone   Notes  [place]  [phone]   [facility]    [phone]   [facility] [place], [place] [address] [phone]   [place] [address] [place] [phone]   Planned Parenthood    [phone]   [facility]    [phone]   [place] and [place] [address], [place] Phone:  [phone], Fax:  [phone] Hours of Operation:  9 am - 6 pm, M-F.  Also accepts Medicaid/Medicare and self-pay.  [facility] for [place] [address], [address], [place] Phone: [phone], Fax: [phone]. Hours of Operation:  8:30 am - 5:30 pm, M-F.  Also accepts Medicaid and self-pay.  [facility] [address] [place] Phone: [phone]   [place], [place], [place] [phone], Ext. [phone] Mondays & Thursdays: 7-9 AM.  First 15 patients are seen on a first come, first serve basis.    [place] Providers:  Organization         Address  Phone   Notes  [facility] [name], Kristeen Mans  A, [place] [phone] Also accepts self-pay patients.  [facility] [address], [place]  [phone]   [place], [address], [place] [phone]   [facility] [address] [place] [phone]   Lucianne Lei [address], [place]   [phone] Only accepts [place] Access [place] patients after they have their name applied to their card.   Self-Pay (no insurance) in [facility]:  Organization         Address  Phone   Notes  Sickle Cell Patients, [facility] [place] [phone]   [facility] [place] [phone]   [facility] [place]  [place], [place], [place] [phone]   [facility]/Dr. Osei-Bonsu  [address], [place] or [address], [place] [phone] Phone number for both [place] and  [place] locations is the same.  Urgent Medical and [facility] [address] [place] [phone]   [facility] [address] [place] or [address] [phone]   [facility] [address] [place] [phone], phone; [phone], fax Sees patients 1st and 3rd Saturday of every month.  Must not qualify for public or private insurance (i.e. Medicaid, Medicare, [place] Health Choice, Veterans' Benefits)  Household income should be no more than 200% of the poverty level The clinic cannot treat you if you are pregnant or think you are pregnant  Sexually transmitted diseases are not treated at the clinic.    Dental Care: Organization         Address  Phone  Notes  [facility] of [facility] [place] [phone] Accepts children up to age 73 who are enrolled in [place] or [place]; pregnant women with a Medicaid card; and children who have applied for Medicaid or [place] Health Choice, but were declined, whose parents can pay a reduced fee at time of service.  [facility] Department of [facility]  [address], [place] [phone] Accepts children up to age 50 who are enrolled in [place] or [place]; pregnant women with a Medicaid card; and children who have applied for Medicaid or [place] Health Choice, but were declined, whose parents can pay a reduced fee at time of service.  [place] Adult [facility] Access PROGRAM  [place] [phone] Patients are seen by appointment only. Walk-ins are not accepted. [place] will see patients 53 years of age and older. Monday - Tuesday (8am-5pm) Most Wednesdays (8:30-5pm) $30 per visit, cash only  [facility] Adult Dental Access PROGRAM  [address], [facility] [phone] Patients are seen by appointment only. Walk-ins are not accepted.  [place] will see patients 62 years of age and older. One Wednesday Evening (Monthly: Volunteer Based).  $30 per visit, cash only  [place]  [phone] for adults; Children under age 4, call Graduate Pediatric Dentistry at [phone]. Children aged 24-14, please call [phone] to request a pediatric application.  Dental services are provided in all areas of dental care including fillings, crowns and bridges, complete and partial dentures, implants, gum treatment, root canals, and extractions. Preventive care  is also provided. Treatment is provided to both adults and children. Patients are selected via a lottery and there is often a waiting list.   [facility] [address] [place]  [phone] [email or web address]   [facility] [address] [place], [place] [phone], Ext. [phone] Second and Fourth Thursday of each month, opens at 6:30 AM; Clinic ends at 9 AM.  Patients are seen on a first-come first-served basis, and a limited number are seen during each clinic.   [facility]  [address] Hillard Danker [place], [place] [phone]   Eligibility Requirements You must have lived in [place], [place], or [place] counties for at least the last three months.   You cannot be eligible for state or federal sponsored [employer], including [employer], [place], or [employer].   You generally cannot be eligible for healthcare insurance through your employer.    How to apply: Eligibility screenings are held every Tuesday and Wednesday afternoon from 1:00 pm until 4:00 pm. You do not need an appointment for the interview!  [facility] [address], [place], [place]   [place]  [place] Department  [place]  [phone]    Behavioral Health Resources in the  Community: Intensive Outpatient Programs Organization         Address  Phone  Notes  [place]. [address], [place], [place] [phone]   [facility] Outpatient [address] [place], [place]   ADS: Alcohol & Drug Svcs [address] [place], [place]   [place] [address]  [place], [place] or [phone]   Substance Abuse Resources Organization         Address  Phone  Notes  Alcohol and Drug Services  [phone]   [place]  [phone]   [facility] [place]   Chinita Pester  [phone]   Residential & Outpatient Substance Abuse Program  [phone]   Psychological Services Organization         Address  Phone  Notes  [facility] [place]  [place]  [phone]   [place] [address] [place] or [phone]    Mobile Crisis Teams Organization         Address  Phone  Notes  [facility], Mobile Crisis Care Unit  [phone]   [facility]  [address] [place], [place]   [place] Levels [address] [place] [phone]    Self-Help/Support Groups Organization         Address  Phone             Notes  [place]. of [place] - variety of support groups  [place] Call for more information  Narcotics Anonymous (NA), [facility] [address], [employer] [place]  2 meetings at this location   Special educational needs teacher         Address  Phone  Notes  ASAP Residential Treatment [place],    [place]  [phone]   [facility]  [address] [place] [phone], [place], [place]   [place], [place] [phone] Admissions: 8am-3pm M-F  Incentives Substance [place] [address]  [place], [place]   [facility] [address] Jadene Pierini [place], [place]   The [place].,  [place], [place]   [facility] - Intensive Outpatient [address] Kristeen [address], [place], [place]   [facility] [address]  [place], [place] [phone] or [phone]   [facility] Services (RTS) [address] [place], [place] Accepts Medicaid  [facility] [place] [address],  [place] [phone] Substance Abuse/Addiction Treatment   [facility] Organization         Address  Phone  Notes  [facility]  [phone]   Domenic Schwab, PhD [address] Arlis Porta [place], [place]   [phone] or [phone]   [facility] [place], [place] [phone]   [facility] [address], [place], [place] [phone] Insurance/Medicaid/sponsorship through [facility] and Families [address] [place]                                    [place], [place] [phone] [place] [address], [place] [phone]    Dr. Adele Schilder  [phone]   [facility] Dept. 1) [address], [place] 2) [place] 3)  [place] [address], [place] [phone]  [phone]   [place] [phone] or [phone] (After Hours)

## 2014-08-14 NOTE — Telephone Encounter (Signed)
Left message to call back  

## 2014-08-15 ENCOUNTER — Inpatient Hospital Stay (HOSPITAL_COMMUNITY): Admission: RE | Admit: 2014-08-15 | Payer: Medicare Other | Source: Ambulatory Visit

## 2014-08-15 LAB — CBG MONITORING, ED: Glucose-Capillary: 72 mg/dL (ref 70–99)

## 2014-08-16 ENCOUNTER — Inpatient Hospital Stay (HOSPITAL_COMMUNITY): Admission: RE | Admit: 2014-08-16 | Payer: Medicare Other | Source: Ambulatory Visit

## 2014-08-24 NOTE — Telephone Encounter (Signed)
Have not heard back from pt, he cx'd appt on 11/3 and no showed for appt on 11/4

## 2014-09-05 ENCOUNTER — Encounter (HOSPITAL_COMMUNITY): Payer: Medicare Other

## 2014-09-21 ENCOUNTER — Inpatient Hospital Stay (HOSPITAL_COMMUNITY)
Admission: AD | Admit: 2014-09-21 | Discharge: 2014-09-25 | DRG: 293 | Disposition: A | Discharge status: Home / Self Care | Payer: Medicare Other | Source: Ambulatory Visit | Attending: Cardiology | Admitting: Cardiology

## 2014-09-21 ENCOUNTER — Telehealth (HOSPITAL_COMMUNITY): Payer: Self-pay | Admitting: Cardiology

## 2014-09-21 ENCOUNTER — Encounter (HOSPITAL_COMMUNITY): Payer: Self-pay | Admitting: General Practice

## 2014-09-21 ENCOUNTER — Ambulatory Visit (HOSPITAL_BASED_OUTPATIENT_CLINIC_OR_DEPARTMENT_OTHER)
Admission: RE | Admit: 2014-09-21 | Discharge: 2014-09-21 | Disposition: A | Discharge status: Home / Self Care | Payer: Medicare Other | Source: Ambulatory Visit | Attending: Cardiology | Admitting: Cardiology

## 2014-09-21 ENCOUNTER — Encounter (HOSPITAL_COMMUNITY): Payer: Self-pay | Admitting: Internal Medicine

## 2014-09-21 VITALS — BP 125/75 | HR 107 | Resp 20 | Wt 162.4 lb

## 2014-09-21 DIAGNOSIS — F1410 Cocaine abuse, uncomplicated: Secondary | ICD-10-CM | POA: Insufficient documentation

## 2014-09-21 DIAGNOSIS — E119 Type 2 diabetes mellitus without complications: Secondary | ICD-10-CM | POA: Diagnosis present

## 2014-09-21 DIAGNOSIS — I2582 Chronic total occlusion of coronary artery: Secondary | ICD-10-CM | POA: Diagnosis present

## 2014-09-21 DIAGNOSIS — I5023 Acute on chronic systolic (congestive) heart failure: Secondary | ICD-10-CM | POA: Diagnosis present

## 2014-09-21 DIAGNOSIS — R000 Tachycardia, unspecified: Secondary | ICD-10-CM

## 2014-09-21 DIAGNOSIS — R0602 Shortness of breath: Secondary | ICD-10-CM

## 2014-09-21 DIAGNOSIS — Z8701 Personal history of pneumonia (recurrent): Secondary | ICD-10-CM | POA: Diagnosis not present

## 2014-09-21 DIAGNOSIS — R05 Cough: Secondary | ICD-10-CM

## 2014-09-21 DIAGNOSIS — I428 Other cardiomyopathies: Secondary | ICD-10-CM

## 2014-09-21 DIAGNOSIS — I10 Essential (primary) hypertension: Secondary | ICD-10-CM | POA: Diagnosis present

## 2014-09-21 DIAGNOSIS — R0781 Pleurodynia: Secondary | ICD-10-CM | POA: Diagnosis present

## 2014-09-21 DIAGNOSIS — E780 Pure hypercholesterolemia: Secondary | ICD-10-CM | POA: Diagnosis present

## 2014-09-21 DIAGNOSIS — G4730 Sleep apnea, unspecified: Secondary | ICD-10-CM | POA: Diagnosis present

## 2014-09-21 DIAGNOSIS — F17210 Nicotine dependence, cigarettes, uncomplicated: Secondary | ICD-10-CM

## 2014-09-21 DIAGNOSIS — E1165 Type 2 diabetes mellitus with hyperglycemia: Secondary | ICD-10-CM | POA: Insufficient documentation

## 2014-09-21 DIAGNOSIS — Z79899 Other long term (current) drug therapy: Secondary | ICD-10-CM | POA: Insufficient documentation

## 2014-09-21 DIAGNOSIS — I059 Rheumatic mitral valve disease, unspecified: Secondary | ICD-10-CM

## 2014-09-21 DIAGNOSIS — I255 Ischemic cardiomyopathy: Secondary | ICD-10-CM | POA: Diagnosis present

## 2014-09-21 DIAGNOSIS — Z7901 Long term (current) use of anticoagulants: Secondary | ICD-10-CM | POA: Insufficient documentation

## 2014-09-21 DIAGNOSIS — I2510 Atherosclerotic heart disease of native coronary artery without angina pectoris: Secondary | ICD-10-CM

## 2014-09-21 DIAGNOSIS — Z95810 Presence of automatic (implantable) cardiac defibrillator: Secondary | ICD-10-CM | POA: Insufficient documentation

## 2014-09-21 DIAGNOSIS — I5022 Chronic systolic (congestive) heart failure: Secondary | ICD-10-CM

## 2014-09-21 DIAGNOSIS — R0600 Dyspnea, unspecified: Secondary | ICD-10-CM | POA: Diagnosis present

## 2014-09-21 DIAGNOSIS — R071 Chest pain on breathing: Secondary | ICD-10-CM

## 2014-09-21 DIAGNOSIS — Z720 Tobacco use: Secondary | ICD-10-CM

## 2014-09-21 DIAGNOSIS — R52 Pain, unspecified: Secondary | ICD-10-CM

## 2014-09-21 DIAGNOSIS — F17200 Nicotine dependence, unspecified, uncomplicated: Secondary | ICD-10-CM

## 2014-09-21 DIAGNOSIS — I509 Heart failure, unspecified: Secondary | ICD-10-CM

## 2014-09-21 HISTORY — DX: Pure hypercholesterolemia, unspecified: E78.00

## 2014-09-21 HISTORY — DX: Type 2 diabetes mellitus without complications: E11.9

## 2014-09-21 LAB — CBC WITH DIFFERENTIAL/PLATELET
Basophils Absolute: 0 10*3/uL (ref 0.0–0.1)
Basophils Relative: 0 % (ref 0–1)
Eosinophils Absolute: 0.2 10*3/uL (ref 0.0–0.7)
Eosinophils Relative: 2 % (ref 0–5)
HCT: 46.4 % (ref 39.0–52.0)
Hemoglobin: 14.8 g/dL (ref 13.0–17.0)
Lymphocytes Relative: 34 % (ref 12–46)
Lymphs Abs: 3 10*3/uL (ref 0.7–4.0)
MCH: 29.9 pg (ref 26.0–34.0)
MCHC: 31.9 g/dL (ref 30.0–36.0)
MCV: 93.7 fL (ref 78.0–100.0)
Monocytes Absolute: 0.6 10*3/uL (ref 0.1–1.0)
Monocytes Relative: 7 % (ref 3–12)
Neutro Abs: 5.1 10*3/uL (ref 1.7–7.7)
Neutrophils Relative %: 57 % (ref 43–77)
Platelets: 244 10*3/uL (ref 150–400)
RBC: 4.95 MIL/uL (ref 4.22–5.81)
RDW: 13.1 % (ref 11.5–15.5)
WBC: 9 10*3/uL (ref 4.0–10.5)

## 2014-09-21 LAB — COMPREHENSIVE METABOLIC PANEL
ALT: 72 U/L — ABNORMAL HIGH (ref 0–53)
AST: 38 U/L — ABNORMAL HIGH (ref 0–37)
Albumin: 3.4 g/dL — ABNORMAL LOW (ref 3.5–5.2)
Alkaline Phosphatase: 138 U/L — ABNORMAL HIGH (ref 39–117)
Anion gap: 11 (ref 5–15)
BUN: 16 mg/dL (ref 6–23)
CO2: 28 mEq/L (ref 19–32)
Calcium: 9.3 mg/dL (ref 8.4–10.5)
Chloride: 99 mEq/L (ref 96–112)
Creatinine, Ser: 1.17 mg/dL (ref 0.50–1.35)
GFR calc Af Amer: 81 mL/min — ABNORMAL LOW (ref >90)
GFR calc non Af Amer: 70 mL/min — ABNORMAL LOW (ref >90)
Glucose, Bld: 353 mg/dL — ABNORMAL HIGH (ref 70–99)
Potassium: 4.5 mEq/L (ref 3.7–5.3)
Sodium: 138 mEq/L (ref 137–147)
Total Bilirubin: 0.5 mg/dL (ref 0.3–1.2)
Total Protein: 7 g/dL (ref 6.0–8.3)

## 2014-09-21 LAB — TSH: TSH: 1.52 u[IU]/mL (ref 0.350–4.500)

## 2014-09-21 LAB — D-DIMER, QUANTITATIVE: D-Dimer, Quant: 0.28 ug/mL-FEU (ref 0.00–0.48)

## 2014-09-21 LAB — RAPID URINE DRUG SCREEN, HOSP PERFORMED
Amphetamines: NOT DETECTED
Barbiturates: NOT DETECTED
Benzodiazepines: NOT DETECTED
Cocaine: POSITIVE — AB
Opiates: NOT DETECTED
Tetrahydrocannabinol: NOT DETECTED

## 2014-09-21 LAB — MAGNESIUM: Magnesium: 1.9 mg/dL (ref 1.5–2.5)

## 2014-09-21 LAB — PROTIME-INR
INR: 1.17 (ref 0.00–1.49)
Prothrombin Time: 15 seconds (ref 11.6–15.2)

## 2014-09-21 LAB — GLUCOSE, CAPILLARY: Glucose-Capillary: 332 mg/dL — ABNORMAL HIGH (ref 70–99)

## 2014-09-21 LAB — PRO B NATRIURETIC PEPTIDE: Pro B Natriuretic peptide (BNP): 1870 pg/mL — ABNORMAL HIGH (ref 0–125)

## 2014-09-21 MED ORDER — SODIUM CHLORIDE 0.9 % IJ SOLN: 3.0000 mL | INTRAMUSCULAR | Status: DC | PRN

## 2014-09-21 MED ORDER — WARFARIN - PHARMACIST DOSING INPATIENT: Freq: Every day | Status: DC

## 2014-09-21 MED ORDER — FUROSEMIDE 10 MG/ML IJ SOLN
80.0000 mg | Freq: Two times a day (BID) | INTRAMUSCULAR | Status: DC
  Administered 2014-09-21 – 2014-09-23 (×4): 80 mg via INTRAVENOUS
  Filled 2014-09-21 (×7): qty 8

## 2014-09-21 MED ORDER — CARVEDILOL 3.125 MG PO TABS: 3.1250 mg | ORAL_TABLET | Freq: Two times a day (BID) | ORAL | Status: DC

## 2014-09-21 MED ORDER — ENOXAPARIN SODIUM 40 MG/0.4ML ~~LOC~~ SOLN
40.0000 mg | SUBCUTANEOUS | Status: DC
  Administered 2014-09-21 – 2014-09-24 (×4): 40 mg via SUBCUTANEOUS
  Filled 2014-09-21 (×5): qty 0.4

## 2014-09-21 MED ORDER — CARVEDILOL 3.125 MG PO TABS
3.1250 mg | ORAL_TABLET | Freq: Two times a day (BID) | ORAL | Status: DC
  Administered 2014-09-21 – 2014-09-25 (×4): 3.125 mg via ORAL
  Filled 2014-09-21 (×10): qty 1

## 2014-09-21 MED ORDER — SODIUM CHLORIDE 0.9 % IJ SOLN
3.0000 mL | Freq: Two times a day (BID) | INTRAMUSCULAR | Status: DC
  Administered 2014-09-21 – 2014-09-25 (×8): 3 mL via INTRAVENOUS

## 2014-09-21 MED ORDER — SODIUM CHLORIDE 0.9 % IV SOLN: 250.0000 mL | INTRAVENOUS | Status: DC | PRN

## 2014-09-21 MED ORDER — WARFARIN SODIUM 6 MG PO TABS
7.0000 mg | ORAL_TABLET | Freq: Once | ORAL | Status: AC
  Administered 2014-09-21: 7 mg via ORAL
  Filled 2014-09-21: qty 1

## 2014-09-21 MED ORDER — INFLUENZA VAC SPLIT QUAD 0.5 ML IM SUSY
0.5000 mL | PREFILLED_SYRINGE | INTRAMUSCULAR | Status: AC
  Administered 2014-09-22: 0.5 mL via INTRAMUSCULAR
  Filled 2014-09-21: qty 0.5

## 2014-09-21 MED ORDER — INSULIN ASPART 100 UNIT/ML ~~LOC~~ SOLN
0.0000 [IU] | Freq: Three times a day (TID) | SUBCUTANEOUS | Status: DC
Start: 2014-09-22 — End: 2014-09-22
  Administered 2014-09-22: 5 [IU] via SUBCUTANEOUS

## 2014-09-21 MED ORDER — WARFARIN SODIUM 6 MG PO TABS: 7.0000 mg | ORAL_TABLET | Freq: Once | ORAL | Status: DC

## 2014-09-21 MED ORDER — FUROSEMIDE 20 MG PO TABS: 20.0000 mg | ORAL_TABLET | ORAL | Status: DC

## 2014-09-21 MED ORDER — SIMVASTATIN 80 MG PO TABS: 80.0000 mg | ORAL_TABLET | Freq: Every day | ORAL | Status: DC

## 2014-09-21 MED ORDER — INSULIN ASPART 100 UNIT/ML ~~LOC~~ SOLN
0.0000 [IU] | Freq: Every day | SUBCUTANEOUS | Status: DC
  Administered 2014-09-21 – 2014-09-22 (×2): 4 [IU] via SUBCUTANEOUS
  Administered 2014-09-23: 2 [IU] via SUBCUTANEOUS

## 2014-09-21 MED ORDER — ONDANSETRON HCL 4 MG/2ML IJ SOLN: 4.0000 mg | Freq: Four times a day (QID) | INTRAMUSCULAR | Status: DC | PRN

## 2014-09-21 MED ORDER — SPIRONOLACTONE 12.5 MG HALF TABLET: 12.5000 mg | ORAL_TABLET | Freq: Every day | ORAL | Status: DC

## 2014-09-21 MED ORDER — LOSARTAN POTASSIUM 25 MG PO TABS: 12.5000 mg | ORAL_TABLET | Freq: Every day | ORAL | Status: DC

## 2014-09-21 MED ORDER — ACETAMINOPHEN 325 MG PO TABS: 650.0000 mg | ORAL_TABLET | ORAL | Status: DC | PRN

## 2014-09-21 MED ORDER — LOSARTAN POTASSIUM 25 MG PO TABS
25.0000 mg | ORAL_TABLET | Freq: Every day | ORAL | Status: DC
  Administered 2014-09-21: 25 mg via ORAL
  Filled 2014-09-21 (×2): qty 1

## 2014-09-21 NOTE — Progress Notes (Signed)
  Echocardiogram 2D Echocardiogram has been performed.  Dennis [name] 09/21/2014, 5:47 PM

## 2014-09-21 NOTE — Telephone Encounter (Signed)
Pending direct admission from CHF clinic- cpt code [phone], icd 10- i50.22 With pts current insurance [employer], case has been started for prior auth case # [id number] Per representative, this will suffice and someone will be in contact if additional information is needed

## 2014-09-21 NOTE — Progress Notes (Signed)
Awaiting phlebotomy to draw blood on patient. Called lab who is aware that patient has orders for blood to be drawn.

## 2014-09-21 NOTE — Progress Notes (Signed)
Patient placed on cardiac monitor. Patient appears to be in a sinus tach rhythm with rate of 107. Will notify CCMD.

## 2014-09-21 NOTE — H&P (Signed)
ADVANCED HEART FAILURE H&P   HPI: 53 y/o male with h/o DM2, polysubstance use (tobacco, cocaine), mixed ICM/NICM and chronic systolic HF due to ventricular noncompaction and possible ischemic heart disease (reportedly had a collateralized LAD on last cath).   He returns for an acute work in due to increased dyspnea. Has missed several appointments. Increased dyspnea at rest and with exertion. He has been out of all medications for about a week and a half. Has not had coumadin in the last week and a half.Says he has cough for weeks. Has been taking cough drops. Productive sputum with tan color. + Orthopnea. No fever. Smokes 2 cigarettes per day. Denies cocaine use over the last month. Weight at home 153-156 pounds.   [place] 09/21/14  RA = 2 RV = 19/2/3 PA = 22/14 (18) PCW = 6 Fick cardiac output/index = 5.2/2.7 PVR = 2.3 WU FA sat = 93% PA sat = 60%, 64%   ECHO EF 20-25%. RV ok. (05/2014) SH: Unemployed . Lives with his brother. He is able to drive to appointments.  FH: Dad and Mother CAD    Review of Systems:     Cardiac Review of Systems: {Y] = yes [ ]  = no  Chest Pain [    ]  Resting SOB [  Y ] Exertional SOB  [ Y ]  Orthopnea [Y  ]   Pedal Edema [   ]    Palpitations [  ] Syncope  [  ]   Presyncope [   ]  General Review of Systems: [Y] = yes [  ]=no Constitional: recent weight change [  ]; anorexia [  ]; fatigue [Y  ]; nausea [  ]; night sweats [  ]; fever [  ]; or chills [  ];                                                                     Dental: poor dentition[  ];   Eye : blurred vision [  ]; diplopia [   ]; vision changes [  ];  Amaurosis fugax[  ]; Resp: cough [  ];  wheezing[  ];  hemoptysis[  ]; shortness of breath[  ]; paroxysmal nocturnal dyspnea[  ]; dyspnea on exertion[  ]; or orthopnea[  ];  GI:  gallstones[  ], vomiting[  ];  dysphagia[  ]; melena[  ];  hematochezia [  ]; heartburn[  ];   GU: kidney stones [  ]; hematuria[  ];   dysuria [  ];  nocturia[   ];               Skin: rash [  ], swelling[  ];, hair loss[  ];  peripheral edema[  ];  or itching[  ]; Musculosketetal: myalgias[  ];  joint swelling[  ];  joint erythema[  ];  joint pain[  ];  back pain[  ];  Heme/Lymph: bruising[  ];  bleeding[  ];  anemia[  ];  Neuro: TIA[  ];  headaches[  ];  stroke[  ];  vertigo[  ];  seizures[  ];   paresthesias[  ];  difficulty walking[  ];  Psych:depression[  ]; anxiety[  ];  Endocrine: diabetes[ Y ];  thyroid  dysfunction[  ];  Other:  Past Medical History  Diagnosis Date  . Chronic systolic heart failure     a) Mixed ICM/NICM b) RHC (05/2014): RA 2, RV 19/2/3, PA 22/14 (18), PCWP 6, Fick CO/CI: 5.2 / 2.7, PVR 2.3 WU, PA 60% and 64% c) ECHO (05/2014): EF 20-25%, diff HK, akinesis entireanteroseptal myocardium, triv AI, mod MR, LA mod/sev dilated  . Drug abuse and dependence   . Automatic implantable cardioverter-defibrillator in situ   . Heart murmur   . Diabetes mellitus type 1   . Sleep apnea     "cleared after T&A"  . HTN (hypertension) 03/04/2013  . Left ventricular noncompaction   . Ischemic cardiomyopathy     a) Coronary angiography (12/2008) at [facility]: Lmain: nl, LAD mid 100% stenosis with left to left and right-to-left collaterals to the distal LAD; Lcx: nl, RCA nl.      @HMED @   No Known Allergies  History   Social History  . Marital Status: Single    Spouse Name: N/A    Number of Children: 1  . Years of Education: N/A   Occupational History  . Disabled    Social History Main Topics  . Smoking status: Former Smoker -- 0.50 packs/day for 35 years    Types: Cigarettes    Quit date: 05/24/2014  . Smokeless tobacco: Never Used  . Alcohol Use: Yes     Comment: 05/26/2014 "might have a drink maybe once/month, if that"  . Drug Use: Yes    Special: Cocaine     Comment: Reports he is no longer using cocaine.   . Sexual Activity: Yes   Other Topics Concern  . Not on file   Social History Narrative   Lives in [place]  with his parents.     Family History  Problem Relation Age of Onset  . Diabetes Mother   . Heart disease Mother   . Diabetes Father   . Prostate cancer Father   . Heart disease Father   . Diabetes Brother   . Diabetes Brother     PHYSICAL EXAM: General: Dyspneic at rest.  HEENT: normal Neck: supple. JVP 12. Carotids 2+ bilaterally; no bruits. No lymphadenopathy or thryomegaly appreciated. Cor: PMI normal. Tachy rate & rhythm. No rubs or murmurs. +S3.  Lungs: clear Tenderness noted R ribst Abdomen: soft, nontender, nondistended. No hepatosplenomegaly. No bruits or masses. Good bowel sounds. Extremities: no cyanosis, clubbing, rash, edema Neuro: alert & orientedx3, cranial nerves grossly intact. Moves all 4 extremities w/o difficulty. Affect pleasant.  EKG: ST 107 bpm  ECG:  No results found for this or any previous visit (from the past 24 hour(s)). No results found.   ASSESSMENT/Plan   1) Chronic systolic HF: Mixed ICM/NICM (suspect noncompaction) s/p ICD, EF 20-25%, mod MR (05/2014). St Jude Device. NYHA III/IV.  - Today he is SOB at rest and he is currently off all medications. Volume status elevated off all medications.Will need to admit and restart HF medications. Give 80 mg IV lasix twice a day.  2) CAD: Coronary angiography (12/2008): 12/2008): mid LAD totally occluded and collateralized.  3) L ventricular non-compaction: Has been on coumadin for this. He has been off coumadin for 1 1/2 weeks. Will need to restart.Pharmacy consult for coumadin 4) DM2: Uncontrolled. Hgb A1C 11%. Check A1C  5) Tobacco and drug abuse: Current smoker. Has not used cocaine in the last few weeks. Check UDS 6) Dyspnea: In part due to volume overload .? Fractured rib. Get  CXR.    Admitted today from HF clinic to diurese and restart HF medications.   CLEGG,AMY NP-C  4:58 PM

## 2014-09-21 NOTE — Progress Notes (Signed)
Patient has arrived to room 3E24. Patient alert and oriented x4. Patient has no complaints at this time. Patient oriented to unit. Will monitor.

## 2014-09-21 NOTE — Progress Notes (Signed)
Patient ID: Dennis [name], male   DOB: 1960-11-06, 53 y.o.   MRN: [id number] PCP: N/A Primary Cardiologist: previously Dr. 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   He returns for an acute work in due to increased dyspnea.  Has missed several appointments. Increased dyspnea at rest and with exertion.  He has been out of all medications for about a week and a half. Has not had coumadin in the last week and a half.Says he has cough for  weeks. Has been taking cough drops. Productive sputum with tan color. + Orthopnea. No fever.  Smokes 2 cigarettes per day. Denies cocaine use over the last month. Weight at home 153-156 pounds.   [place]  09/21/14  RA = 2 RV = 19/2/3 PA = 22/14 (18) PCW = 6 Fick cardiac output/index = 5.2/2.7 PVR = 2.3 WU FA sat = 93% PA sat = 60%, 64%   ECHO EF 20-25%. RV ok. (05/2014) SH:  Unemployed . Lives with his brother. He is able to drive to appointments.  FH: Dad and Mother CAD   ROS: All systems negative except as listed in HPI, PMH and Problem List.  SH:  History   Social History  . Marital Status: Single    Spouse Name: N/A    Number of Children: 1  . Years of Education: N/A   Occupational History  . Disabled    Social History Main Topics  . Smoking status: Former Smoker -- 0.50 packs/day for 35 years    Types: Cigarettes    Quit date: 05/24/2014  . Smokeless tobacco: Never Used  . Alcohol Use: Yes     Comment: 05/26/2014 "might have a drink maybe once/month, if that"  . Drug Use: Yes    Special: Cocaine     Comment: Reports he is no longer using cocaine.   . Sexual Activity: Yes   Other Topics Concern  . Not on file   Social History Narrative   Lives in [place] with his parents.     FH:  Family History  Problem Relation Age of Onset   . Diabetes Mother   . Heart disease Mother   . Diabetes Father   . Prostate cancer Father   . Heart disease Father   . Diabetes Brother   . Diabetes Brother     Past Medical History  Diagnosis Date  . Chronic systolic heart failure     a) Mixed ICM/NICM b) RHC (05/2014): RA 2, RV 19/2/3, PA 22/14 (18), PCWP 6, Fick CO/CI: 5.2 / 2.7, PVR 2.3 WU, PA 60% and 64% c) ECHO (05/2014): EF 20-25%, diff HK, akinesis entireanteroseptal myocardium, triv AI, mod MR, LA mod/sev dilated  . Drug abuse and dependence   . Automatic implantable cardioverter-defibrillator in situ   . Heart murmur   . Diabetes mellitus type 1   . Sleep apnea     "cleared after T&A"  . HTN (hypertension) 03/04/2013  . Left ventricular noncompaction   . Ischemic cardiomyopathy     a) Coronary angiography (12/2008) at [facility]: Lmain: nl, LAD mid 100% stenosis with left to left and right-to-left collaterals to the distal LAD; Lcx: nl, RCA nl.      Current Outpatient Prescriptions  Medication Sig Dispense Refill  . acetaminophen (TYLENOL) 500 MG tablet Take 1,000 mg by mouth every 6 (  six) hours as needed for moderate pain.    . carvedilol (COREG) 3.125 MG tablet Take 1 tablet (3.125 mg total) by mouth 2 (two) times daily with a meal. 60 tablet 3  . furosemide (LASIX) 20 MG tablet Take 1 tablet (20 mg total) by mouth every other day. Or as instructed by provider. 15 tablet 3  . insulin glargine (LANTUS) 100 UNIT/ML injection Inject 32 Units into the skin at bedtime.    Marland Kitchen losartan (COZAAR) 25 MG tablet Take 0.5 tablets (12.5 mg total) by mouth daily. 15 tablet 3  . QC PEN NEEDLES 31G X 6 MM MISC 1 each by Other route See admin instructions. Use daily with insulin.    . simvastatin (ZOCOR) 80 MG tablet Take 1 tablet (80 mg total) by mouth at bedtime. 30 tablet 3  . spironolactone (ALDACTONE) 12.5 mg TABS tablet Take 0.5 tablets (12.5 mg total) by mouth daily. 15 tablet 3  . warfarin (COUMADIN) 2 MG tablet Take 1 tablet (2 mg  total) by mouth daily. 30 tablet 0  . warfarin (COUMADIN) 5 MG tablet Take 1 tablet (5 mg total) by mouth daily. 30 tablet 0   No current facility-administered medications for this encounter.    Filed Vitals:   09/21/14 1458  BP: 125/75  Pulse: 107  Resp: 20  Weight: 162 lb 6 oz (73.653 kg)  SpO2: 96%    PHYSICAL EXAM: General:  Dyspneic at rest.  HEENT: normal Neck: supple. JVP 12. Carotids 2+ bilaterally; no bruits. No lymphadenopathy or thryomegaly appreciated. Cor: PMI normal. Tachy rate & rhythm. No rubs or murmurs. +S3.  Lungs: clear Tenderness noted R ribst Abdomen: soft, nontender, nondistended. No hepatosplenomegaly. No bruits or masses. Good bowel sounds. Extremities: no cyanosis, clubbing, rash, edema Neuro: alert & orientedx3, cranial nerves grossly intact. Moves all 4 extremities w/o difficulty. Affect pleasant.  EKG: ST 107 bpm  ASSESSMENT & PLAN:  1) Chronic systolic HF: Mixed ICM/NICM (suspect noncompaction) s/p ICD, EF 20-25%, mod MR (05/2014). St Jude Device. NYHA III/IV.  - Today he is SOB at rest and he is currently off all medications. Volume status elevated off all medications.Will need to admit and restart HF medications. Give 80 mg IV lasix twice a day.  2) CAD: Coronary angiography (12/2008): 12/2008): mid LAD totally occluded and collateralized.  3) L ventricular non-compaction: Has been on coumadin for this. He  has been off coumadin for 1 1/2 weeks. Will need to restart. 4) DM2: Uncontrolled. Hgb A1C 11%.  5) Tobacco and drug abuse: Current smoker. Has not used cocaine in the last few weeks.  6) Dyspnea: In part due to volume overload .? Fractured rib. Get CXR.   Admit to 3E to further assess dyspnea and restart HF medications. Repeat ECHO.  CLEGG,AMY NP-C  Patient seen with NP, agree with the above note.  Patient is tachycardic with S3 and JVD.  He has been coughing a lot, especially at night.  He has pain in his right lateral chest wall that is  reproducible with palpation.  I am concerned that he fractured a rib with coughing.  - Admit for diuresis and resumption of cardiac meds.  - Needs to be compliant with anticoagulation if coumadin is to continue.  - Will need CXR PA + lateral and rib films.   - Doubt he is having any anginal symptoms, think this is primarily volume overload.  - Will repeat echo.   Loralie Champagne 09/21/2014 3:50 PM

## 2014-09-21 NOTE — Progress Notes (Signed)
ANTICOAGULATION CONSULT NOTE - Initial Consult  Pharmacy Consult for Warfarin Indication: Non-compaction cardiomyopathy  No Known Allergies  Patient Measurements:   Vital Signs: BP: 125/75 mmHg (12/10 1458) Pulse Rate: 107 (12/10 1458)  Labs: No results for input(s): HGB, HCT, PLT, APTT, LABPROT, INR, HEPARINUNFRC, CREATININE, CKTOTAL, CKMB, TROPONINI in the last 72 hours.  Estimated Creatinine Clearance: 88.3 mL/min (by C-G formula based on Cr of 1.02).   Medical History: Past Medical History  Diagnosis Date  . Chronic systolic heart failure     a) Mixed ICM/NICM b) RHC (05/2014): RA 2, RV 19/2/3, PA 22/14 (18), PCWP 6, Fick CO/CI: 5.2 / 2.7, PVR 2.3 WU, PA 60% and 64% c) ECHO (05/2014): EF 20-25%, diff HK, akinesis entireanteroseptal myocardium, triv AI, mod MR, LA mod/sev dilated  . Drug abuse and dependence   . Automatic implantable cardioverter-defibrillator in situ   . Heart murmur   . Diabetes mellitus type 1   . Sleep apnea     "cleared after T&A"  . HTN (hypertension) 03/04/2013  . Left ventricular noncompaction   . Ischemic cardiomyopathy     a) Coronary angiography (12/2008) at [facility]: Lmain: nl, LAD mid 100% stenosis with left to left and right-to-left collaterals to the distal LAD; Lcx: nl, RCA nl.      Medications:  Prescriptions prior to admission  Medication Sig Dispense Refill Last Dose  . acetaminophen (TYLENOL) 500 MG tablet Take 1,000 mg by mouth every 6 (six) hours as needed for moderate pain.   Taking  . carvedilol (COREG) 3.125 MG tablet Take 1 tablet (3.125 mg total) by mouth 2 (two) times daily with a meal. 60 tablet 3   . furosemide (LASIX) 20 MG tablet Take 1 tablet (20 mg total) by mouth every other day. Or as instructed by provider. 15 tablet 3   . insulin glargine (LANTUS) 100 UNIT/ML injection Inject 32 Units into the skin at bedtime.   Taking  . losartan (COZAAR) 25 MG tablet Take 0.5 tablets (12.5 mg total) by mouth daily. 15 tablet 3   . QC  PEN NEEDLES 31G X 6 MM MISC 1 each by Other route See admin instructions. Use daily with insulin.   Taking  . simvastatin (ZOCOR) 80 MG tablet Take 1 tablet (80 mg total) by mouth at bedtime. 30 tablet 3   . spironolactone (ALDACTONE) 12.5 mg TABS tablet Take 0.5 tablets (12.5 mg total) by mouth daily. 15 tablet 3   . warfarin (COUMADIN) 2 MG tablet Take 1 tablet (2 mg total) by mouth daily. 30 tablet 0 Taking  . warfarin (COUMADIN) 5 MG tablet Take 1 tablet (5 mg total) by mouth daily. 30 tablet 0 Taking    Assessment: 53 year old male with known HF in setting of ventricular non-compaction cardiomyopathy admitted 09/21/14 with increase dypsnea. Patient has been on chronic warfarin due to the risk of embolic events with this type of cardiomyopathy.   INR today is 1.17 and CBC is within normal limits.  Home regimen is 7mg  po daily. Per patient - last dose was earlier this week. Confirmed both with patient.   Goal of Therapy:  INR 2-3 Monitor platelets by anticoagulation protocol: Yes   Plan:  1. Warfarin 7 mg po daily.  2. Daily PT/INR.  Sloan Leiter, PharmD, BCPS Clinical Pharmacist [phone] 09/21/2014,4:37 PM

## 2014-09-21 NOTE — Progress Notes (Signed)
CCMD made aware of patients admission./

## 2014-09-22 ENCOUNTER — Inpatient Hospital Stay (HOSPITAL_COMMUNITY): Payer: Medicare Other

## 2014-09-22 DIAGNOSIS — F1410 Cocaine abuse, uncomplicated: Secondary | ICD-10-CM

## 2014-09-22 DIAGNOSIS — I5023 Acute on chronic systolic (congestive) heart failure: Principal | ICD-10-CM

## 2014-09-22 LAB — BASIC METABOLIC PANEL
Anion gap: 12 (ref 5–15)
BUN: 17 mg/dL (ref 6–23)
CO2: 30 mEq/L (ref 19–32)
Calcium: 9.1 mg/dL (ref 8.4–10.5)
Chloride: 100 mEq/L (ref 96–112)
Creatinine, Ser: 1.15 mg/dL (ref 0.50–1.35)
GFR calc Af Amer: 83 mL/min — ABNORMAL LOW (ref >90)
GFR calc non Af Amer: 72 mL/min — ABNORMAL LOW (ref >90)
Glucose, Bld: 229 mg/dL — ABNORMAL HIGH (ref 70–99)
Potassium: 4 mEq/L (ref 3.7–5.3)
Sodium: 142 mEq/L (ref 137–147)

## 2014-09-22 LAB — PROTIME-INR
INR: 1.16 (ref 0.00–1.49)
Prothrombin Time: 15 seconds (ref 11.6–15.2)

## 2014-09-22 LAB — HEMOGLOBIN A1C
Hgb A1c MFr Bld: 10.5 % — ABNORMAL HIGH (ref <5.7)
Mean Plasma Glucose: 255 mg/dL — ABNORMAL HIGH (ref <117)

## 2014-09-22 LAB — GLUCOSE, CAPILLARY
Glucose-Capillary: 238 mg/dL — ABNORMAL HIGH (ref 70–99)
Glucose-Capillary: 250 mg/dL — ABNORMAL HIGH (ref 70–99)
Glucose-Capillary: 129 mg/dL — ABNORMAL HIGH (ref 70–99)
Glucose-Capillary: 346 mg/dL — ABNORMAL HIGH (ref 70–99)

## 2014-09-22 IMAGING — DX DG CHEST 2V
2 series · 2 of 2 positions shown · non-contrast
Comparison: Two-view chest [DATE].

CLINICAL DATA: Shortness of breath.  Right-sided chest pain.

EXAM:
CHEST  2 VIEW

[chest pa]
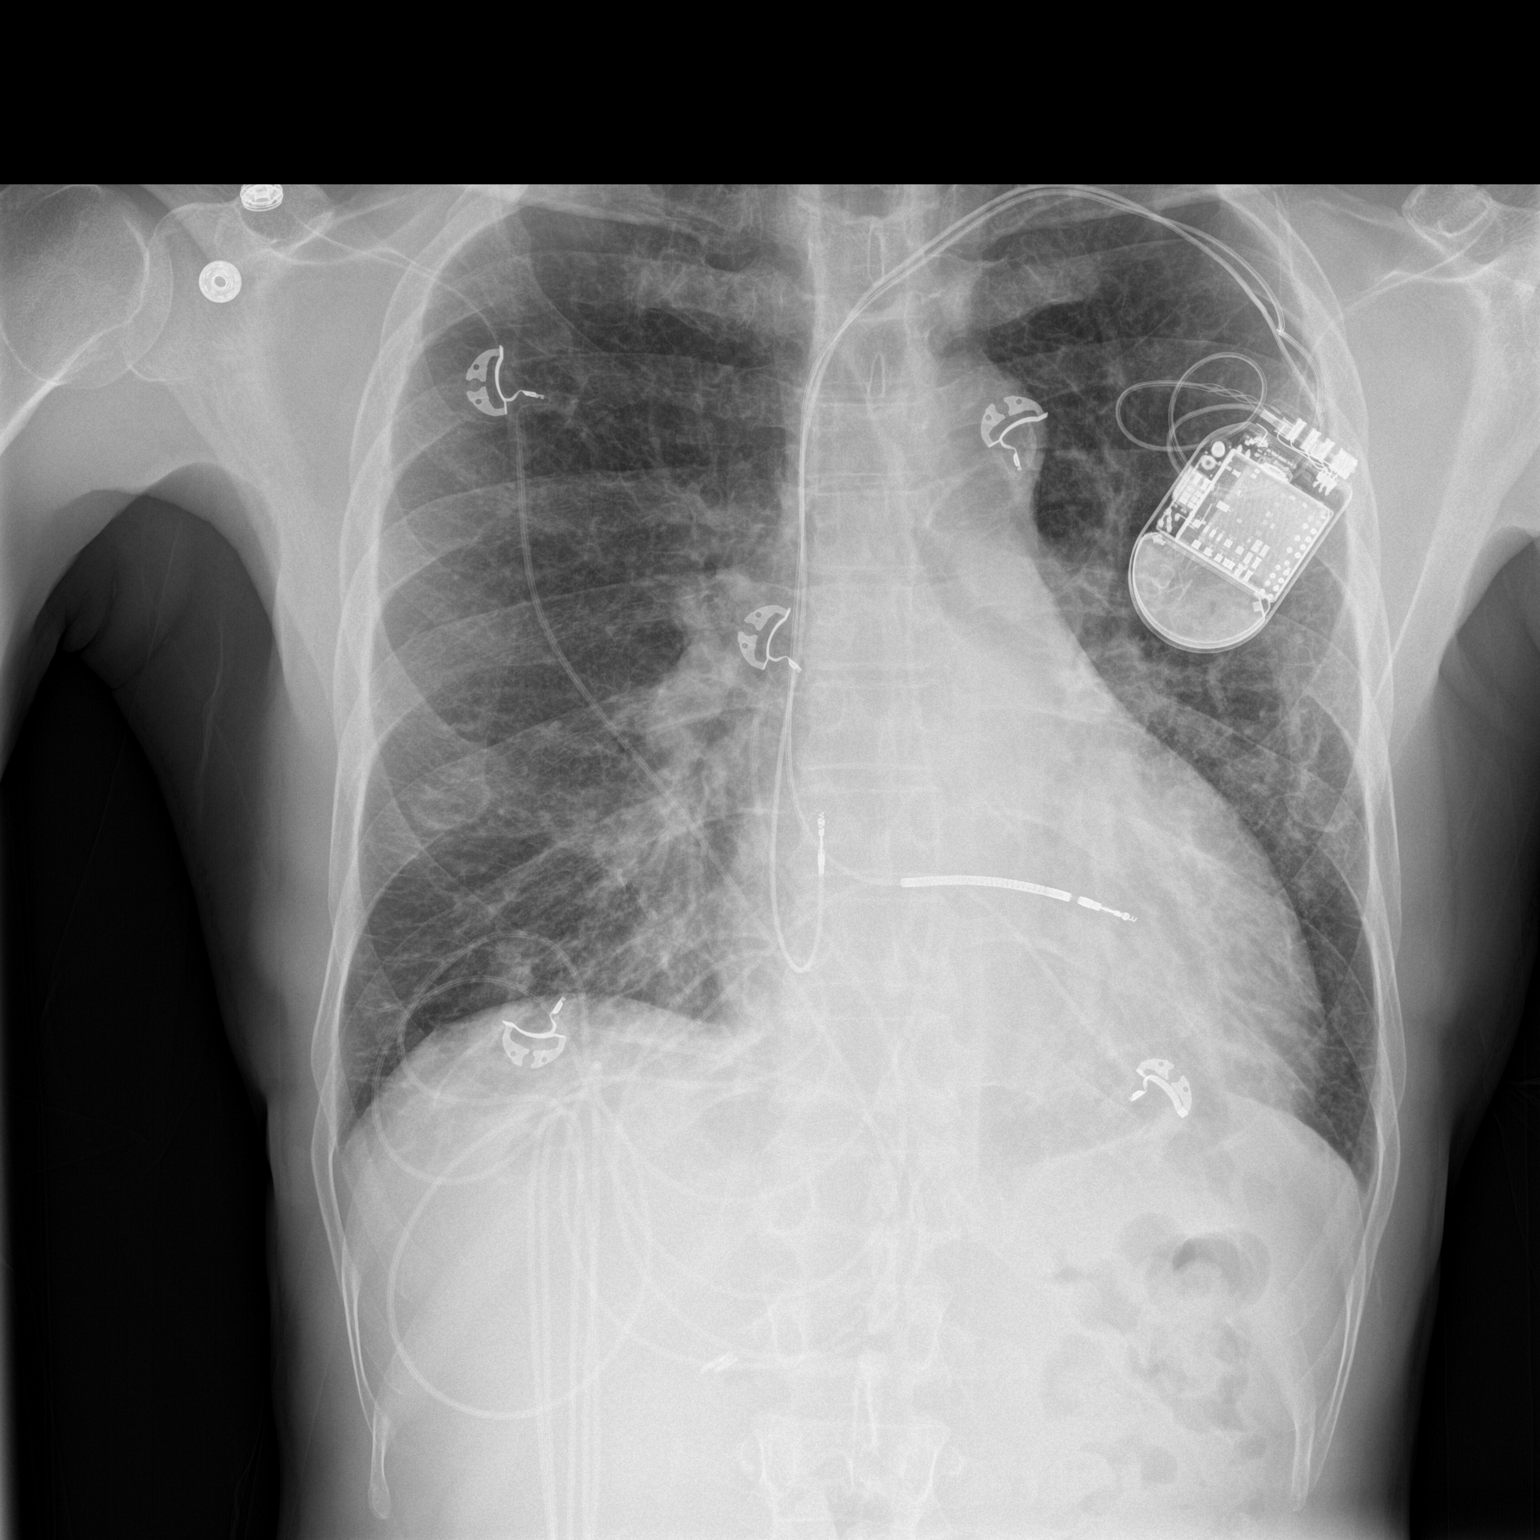

[chest lat]
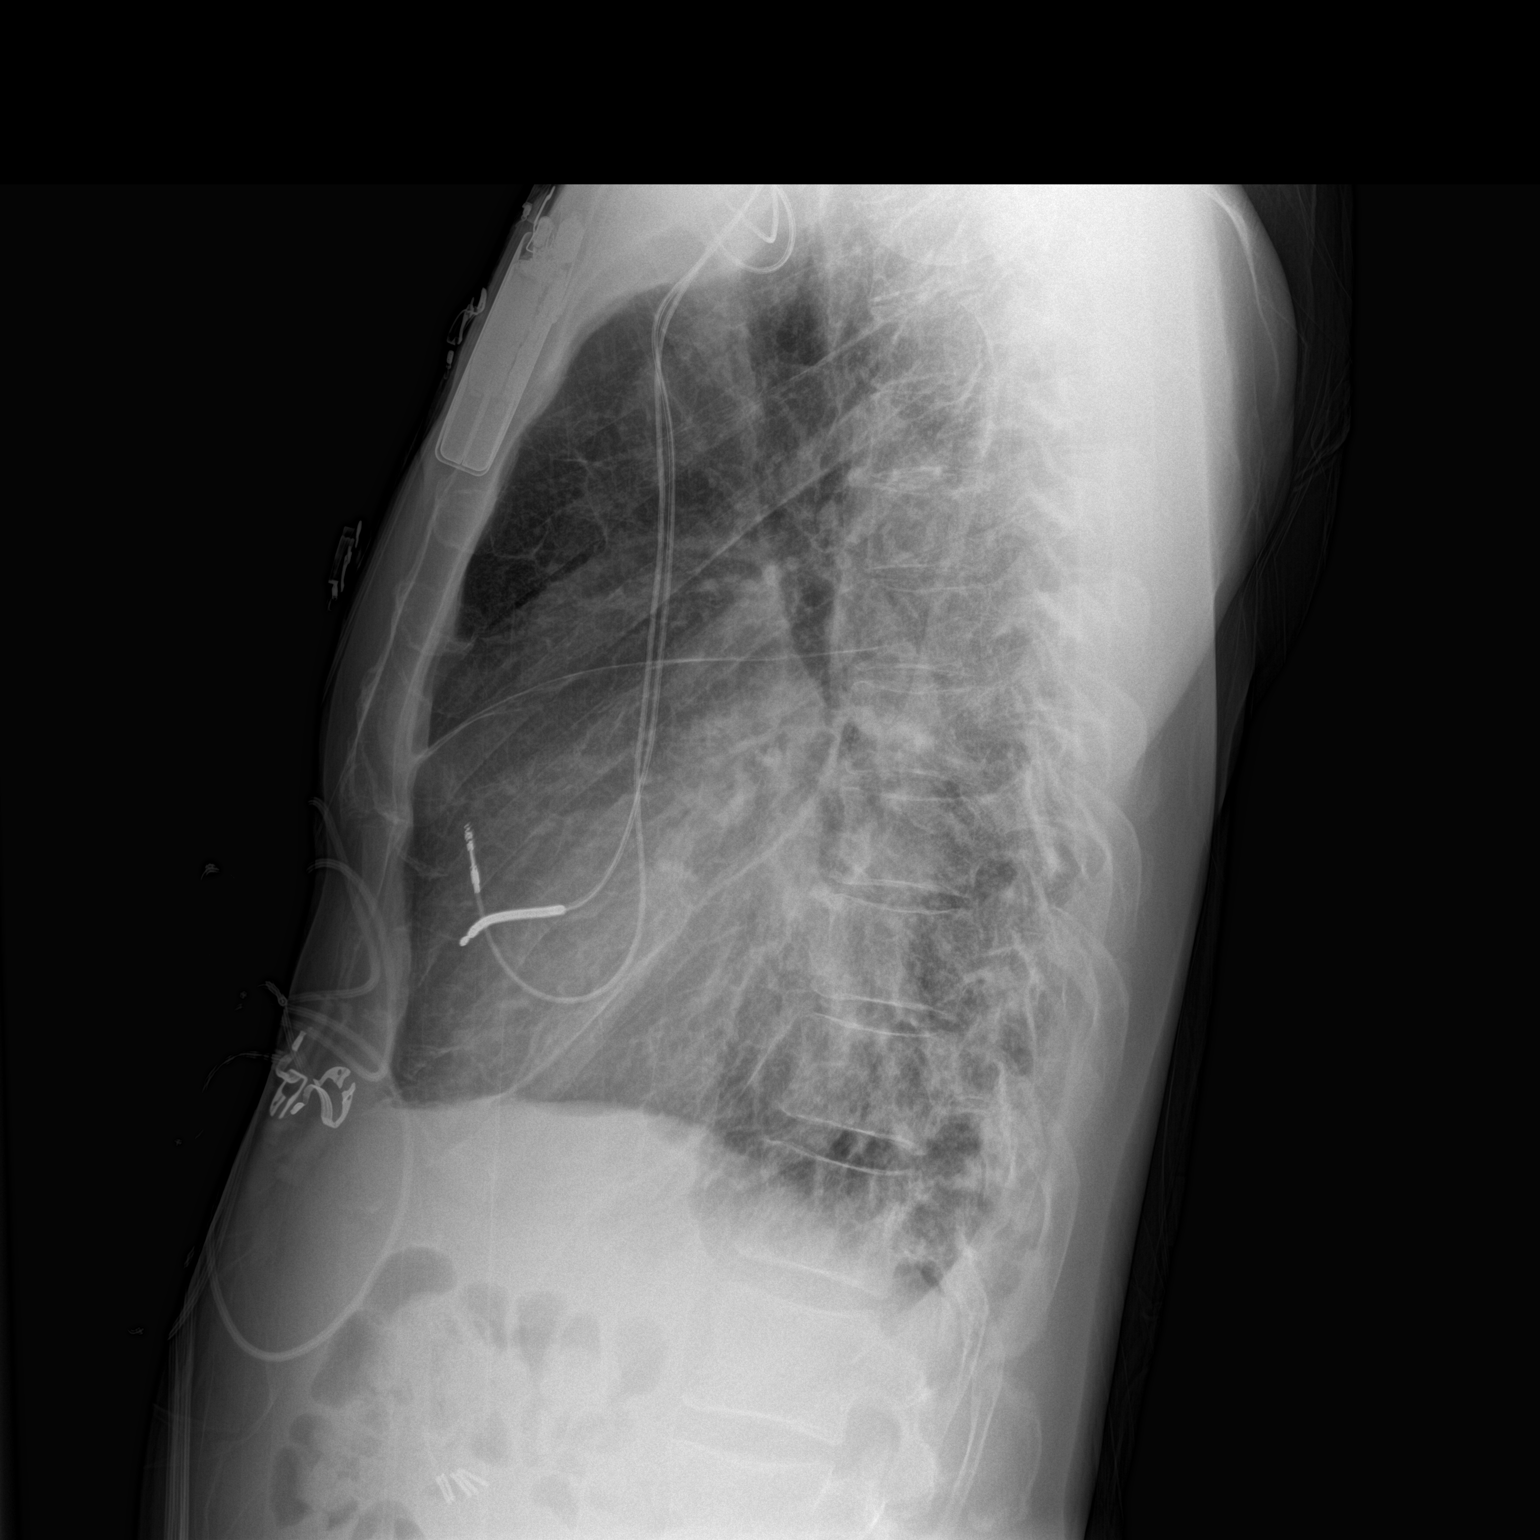

[2 of 2 positions shown; findings below may reference images not displayed]

FINDINGS: The heart is enlarged. There is no edema or effusion to suggest
failure. Emphysematous changes are again noted. Chronic interstitial
changes are present. Aeration at the right base is improved. No new
airspace opacification is evident. Pacing wires are stable.

The visualized soft tissues and bony thorax are unremarkable.
IMPRESSION: 1. Stable cardiomegaly without failure.
2. Improved aeration at the lung bases.
3. Stable emphysema and interstitial disease.

## 2014-09-22 MED ORDER — LOSARTAN POTASSIUM 25 MG PO TABS
25.0000 mg | ORAL_TABLET | Freq: Two times a day (BID) | ORAL | Status: DC
  Administered 2014-09-22 – 2014-09-23 (×3): 25 mg via ORAL
  Filled 2014-09-22 (×6): qty 1

## 2014-09-22 MED ORDER — INSULIN GLARGINE 100 UNIT/ML ~~LOC~~ SOLN
32.0000 [IU] | Freq: Every day | SUBCUTANEOUS | Status: DC
  Administered 2014-09-22 – 2014-09-24 (×3): 32 [IU] via SUBCUTANEOUS
  Filled 2014-09-22 (×4): qty 0.32

## 2014-09-22 MED ORDER — INSULIN ASPART 100 UNIT/ML ~~LOC~~ SOLN: 0.0000 [IU] | Freq: Every day | SUBCUTANEOUS | Status: DC

## 2014-09-22 MED ORDER — TRAMADOL HCL 50 MG PO TABS
50.0000 mg | ORAL_TABLET | Freq: Four times a day (QID) | ORAL | Status: DC | PRN
  Administered 2014-09-23 – 2014-09-24 (×2): 50 mg via ORAL
  Filled 2014-09-22 (×2): qty 1

## 2014-09-22 MED ORDER — GUAIFENESIN 100 MG/5ML PO SYRP
200.0000 mg | ORAL_SOLUTION | ORAL | Status: DC | PRN
  Administered 2014-09-22: 200 mg via ORAL
  Filled 2014-09-22 (×2): qty 10

## 2014-09-22 MED ORDER — INSULIN ASPART 100 UNIT/ML ~~LOC~~ SOLN
0.0000 [IU] | Freq: Three times a day (TID) | SUBCUTANEOUS | Status: DC
  Administered 2014-09-22: 3 [IU] via SUBCUTANEOUS
  Administered 2014-09-22: 7 [IU] via SUBCUTANEOUS
  Administered 2014-09-23: 4 [IU] via SUBCUTANEOUS
  Administered 2014-09-23: 11 [IU] via SUBCUTANEOUS
  Administered 2014-09-24 – 2014-09-25 (×3): 4 [IU] via SUBCUTANEOUS

## 2014-09-22 MED ORDER — WARFARIN SODIUM 10 MG PO TABS
10.0000 mg | ORAL_TABLET | Freq: Once | ORAL | Status: AC
  Administered 2014-09-22: 10 mg via ORAL
  Filled 2014-09-22: qty 1

## 2014-09-22 MED ORDER — ATORVASTATIN CALCIUM 40 MG PO TABS
40.0000 mg | ORAL_TABLET | Freq: Every day | ORAL | Status: DC
  Administered 2014-09-22 – 2014-09-24 (×3): 40 mg via ORAL
  Filled 2014-09-22 (×4): qty 1

## 2014-09-22 NOTE — Care Management Note (Signed)
    Page 1 of 1   09/25/2014     3:53:04 PM CARE MANAGEMENT NOTE 09/25/2014  Patient:  [facility]   Account Number:  [id number]  Date Initiated:  09/22/2014  Documentation initiated by:  AMERSON,JULIE  Subjective/Objective Assessment:   Pt adm on 09/21/14 with CHF.  PTA, pt independent, lives with mother.  Pt followed at [facility].     Action/Plan:   Will follow for dc needs as pt progresses.  Pt denied PSA on admission, but was positive on UDS for cocaine.  Will refer to CSW for PSA counseling.   Anticipated DC Date:  09/24/2014   Anticipated DC Plan:  HOME/SELF CARE  In-house referral  Clinical Social Worker      DC Planning Services  CM consult      Choice offered to / List presented to:             Status of service:  Completed, signed off Medicare Important Message given?  YES (If response is "NO", the following Medicare IM given date fields will be blank) Date Medicare IM given:  09/25/2014 Medicare IM given by:  AMERSON,JULIE Date Additional Medicare IM given:   Additional Medicare IM given by:    Discharge Disposition:  HOME/SELF CARE  Per UR Regulation:  Reviewed for med. necessity/level of care/duration of stay  If discussed at [place] of Stay Meetings, dates discussed:    Comments:  09/25/14 Ellan Lambert, RN, BSN [phone] Pt states currently he has no PCP, but is planning on following up with a [place] PCP at dc.  He states CHF clinic is assisting him with this.

## 2014-09-22 NOTE — Progress Notes (Signed)
Patient ID: Dennis [name], male   DOB: 1961-06-11, 53 y.o.   MRN: [id number]   SUBJECTIVE: Diuresed well, doing better today but still short of breath walking in hall.  Less pain in right chest this morning.  CXR with emphysema, no comment on rib fracture.   Scheduled Meds: . carvedilol  3.125 mg Oral BID WC  . enoxaparin (LOVENOX) injection  40 mg Subcutaneous Q24H  . furosemide  80 mg Intravenous BID  . Influenza vac split quadrivalent PF  0.5 mL Intramuscular Tomorrow-1000  . insulin aspart  0-15 Units Subcutaneous TID WC  . insulin aspart  0-5 Units Subcutaneous QHS  . losartan  25 mg Oral Daily  . sodium chloride  3 mL Intravenous Q12H  . Warfarin - Pharmacist Dosing Inpatient   Does not apply q1800   Continuous Infusions:  PRN Meds:.sodium chloride, acetaminophen, guaifenesin, ondansetron (ZOFRAN) IV, sodium chloride    Filed Vitals:   09/21/14 2029 09/22/14 0004 09/22/14 0134 09/22/14 0559  BP: 106/78 117/65 101/78 104/82  Pulse: 108 108 109 107  Temp: 98.1 F (36.7 C) 97.9 F (36.6 C) 98.5 F (36.9 C) 97.4 F (36.3 C)  TempSrc: Oral Oral Oral Oral  Resp: 24 20 20 20   Height:      Weight:    149 lb 14.6 oz (68 kg)  SpO2: 96% 95% 96% 96%    Intake/Output Summary (Last 24 hours) at 09/22/14 0959 Last data filed at 09/22/14 0850  Gross per 24 hour  Intake   1260 ml  Output   4650 ml  Net  -3390 ml    LABS: Basic Metabolic Panel:  Recent Labs  09/21/14 1928 09/22/14 0444  NA 138 142  K 4.5 4.0  CL 99 100  CO2 28 30  GLUCOSE 353* 229*  BUN 16 17  CREATININE 1.17 1.15  CALCIUM 9.3 9.1  MG 1.9  --    Liver Function Tests:  Recent Labs  09/21/14 1928  AST 38*  ALT 72*  ALKPHOS 138*  BILITOT 0.5  PROT 7.0  ALBUMIN 3.4*   No results for input(s): LIPASE, AMYLASE in the last 72 hours. CBC:  Recent Labs  09/21/14 1928  WBC 9.0  NEUTROABS 5.1  HGB 14.8  HCT 46.4  MCV 93.7  PLT 244   Cardiac Enzymes: No results for input(s): CKTOTAL, CKMB,  CKMBINDEX, TROPONINI in the last 72 hours. BNP: Invalid input(s): POCBNP D-Dimer:  Recent Labs  09/21/14 1928  DDIMER 0.28   Hemoglobin A1C:  Recent Labs  09/21/14 1928  HGBA1C 10.5*   Fasting Lipid Panel: No results for input(s): CHOL, HDL, LDLCALC, TRIG, CHOLHDL, LDLDIRECT in the last 72 hours. Thyroid Function Tests:  Recent Labs  09/21/14 1928  TSH 1.520   Anemia Panel: No results for input(s): VITAMINB12, FOLATE, FERRITIN, TIBC, IRON, RETICCTPCT in the last 72 hours.  RADIOLOGY: Dg Chest 2 View  09/22/2014   CLINICAL DATA:  Shortness of breath.  Right-sided chest pain.  EXAM: CHEST  2 VIEW  COMPARISON:  Two-view chest 08/13/2014.  FINDINGS: The heart is enlarged. There is no edema or effusion to suggest failure. Emphysematous changes are again noted. Chronic interstitial changes are present. Aeration at the right base is improved. No new airspace opacification is evident. Pacing wires are stable.  The visualized soft tissues and bony thorax are unremarkable.  IMPRESSION: 1. Stable cardiomegaly without failure. 2. Improved aeration at the lung bases. 3. Stable emphysema and interstitial disease.   Electronically Signed   By:  Lawrence Santiago M.D.   On: 09/22/2014 07:59    PHYSICAL EXAM General: NAD Neck: JVP 8-9 cm, no thyromegaly or thyroid nodule.  Lungs: Distant breath sounds bilaterally. CV: Nondisplaced PMI.  Heart mildly tachy, regular S1/S2, no S3/S4, no murmur.  No peripheral edema.  No carotid bruit.  Normal pedal pulses.  Abdomen: Soft, nontender, no hepatosplenomegaly, no distention.  Neurologic: Alert and oriented x 3.  Psych: Normal affect. Extremities: No clubbing or cyanosis.   TELEMETRY: Reviewed telemetry pt in sinus tachycardia  ASSESSMENT AND PLAN: 1) Chronic systolic HF: Mixed ICM/NICM (suspect noncompaction) s/p ICD, EF 20-25%, mod MR (05/2014). St Jude ICD. NYHA III/IV, admitted with dyspnea at rest and pleuritic right chest pain + cough. He was  off all his medications.  He was cocaine+. He diuresed well, volume status improved but still some volume overload and dyspnea with ambulation.  - Continue Lasix 80 mg IV bid today, likely can transition to Lasix 40 mg daily for home dose perhaps tomorrow with close followup in CHF clinic.  - Continue Coreg 3.125 mg bid, discussed risk of cocaine with him.  This is an alpha and beta blocker so risk is lower.  - Increase losartan to 25 mg bid.  2) CAD: Coronary angiography (12/2008): 12/2008): mid LAD totally occluded and collateralized. Continue statin and warfarin (no ASA with warfarin use).  3) Left ventricular non-compaction: Has been on coumadin for this long-term. Hehas been off coumadin for 1 1/2 weeks as he is no longer going to [facility]. Will need to restart and transition INR followup to [facility]. 4) DM2: Uncontrolled. Hgb A1C 11%. Will involve diabetes care coordinator and restart his evening glargine dose.  5) Tobacco and drug abuse: Current smoker, needs to quit (emphysema on CXR).  Also needs to stop cocaine.  6) Pleuritic right chest pain: Tender over right lateral ribs.  ?Strained muscle versus fractured rib.  CXR did comment on fracture.  Low risk that this is PE.  D dimer normal.   Loralie Champagne 09/22/2014 10:13 AM

## 2014-09-22 NOTE — Plan of Care (Signed)
Problem: Phase I Progression Outcomes Goal: Dyspnea controlled at rest (HF) Outcome: Completed/Met Date Met:  09/22/14 Goal: Pain controlled with appropriate interventions Outcome: Completed/Met Date Met:  09/22/14 Goal: Voiding-avoid urinary catheter unless indicated Outcome: Completed/Met Date Met:  09/22/14

## 2014-09-22 NOTE — Progress Notes (Signed)
Inpatient Diabetes Program Recommendations  AACE/ADA: New Consensus Statement on Inpatient Glycemic Control (2013)  Target Ranges:  Prepandial:   less than 140 mg/dL      Peak postprandial:   less than 180 mg/dL (1-2 hours)      Critically ill patients:  140 - 180 mg/dL   Inpatient Diabetes Program Recommendations HgbA1C: =10.5   Note: This coordinator met with patient to discuss elevated A1C.  Pt reports he takes his Lantus without missing doses but does not monitor CBGs.  Encouraged pt to monitor CBGs, limit carb intake and follow-up with PCP for management.  Pt expresses understanding and was appreciative of visit. No questions/concerns at the end of our visit.  Thank you  Raoul Pitch BSN, RN,CDE Inpatient Diabetes Coordinator [phone] (team pager)

## 2014-09-22 NOTE — Progress Notes (Signed)
Inpatient Diabetes Program Recommendations  AACE/ADA: New Consensus Statement on Inpatient Glycemic Control (2013)  Target Ranges:  Prepandial:   less than 140 mg/dL      Peak postprandial:   less than 180 mg/dL (1-2 hours)      Critically ill patients:  140 - 180 mg/dL   RN's report that patient frequently requests snacks. Report that glucose is constantly high. Lantus home dose of 32 units to start tonight. It would be normal for patient to be hungry if cbg's are high indicating his body is not getting the glucose he needs in his cells due to the glucose remaining in his blood. Please add snacks to patient's diet as needed.  Inpatient Diabetes Program Recommendations HgbA1C: =10.5  Noted OP ed referral . Will order with [facility] who will call him to schedule an appt. Thank you, Rosita Kea, RN, CNS, Diabetes Coordinator [phone])

## 2014-09-22 NOTE — Progress Notes (Signed)
ANTICOAGULATION CONSULT NOTE - Follow Up Consult  Pharmacy Consult:  Coumadin Indication:  Non-compaction cardiomyopathy  No Known Allergies  Patient Measurements: Height: 6\' 1"  (185.4 cm) Weight: 149 lb 14.6 oz (68 kg) IBW/kg (Calculated) : 79.9  Vital Signs: Temp: 97.4 F (36.3 C) (12/11 0559) Temp Source: Oral (12/11 0559) BP: 104/77 mmHg (12/11 1050) Pulse Rate: 101 (12/11 1050)  Labs:  Recent Labs  09/21/14 1928 09/22/14 0444  HGB 14.8  --   HCT 46.4  --   PLT 244  --   LABPROT 15.0 15.0  INR 1.17 1.16  CREATININE 1.17 1.15    Estimated Creatinine Clearance: 72.3 mL/min (by C-G formula based on Cr of 1.15).     Assessment: 51 YOM to continue on Coumadin for non-compaction cardiomyopathy that predisposes patient to embolic events.  INR sub-therapeutic on admit and patient was also started on prophylactic Lovenox.  INR remains sub-therapeutic.  No bleeding reported.   Goal of Therapy:  INR 2-3    Plan:  - Coumadin 10 PO today - Continue Lovenox 40mg  SQ Q24H until INR therapeutic - Daily PT / INR    Thuy D. Mina Marble, PharmD, BCPS Pager:  [phone] 09/22/2014, 11:28 AM

## 2014-09-22 NOTE — Progress Notes (Signed)
BP 82/70. Pt complained of feeling sleepy and SOB. PA notified and will be up to follow up with patient. Will continue to monitor patient for further changes in condition.

## 2014-09-22 NOTE — Addendum Note (Signed)
Encounter addended by: Georga Kaufmann, CCT on: 09/22/2014 11:33 AM<BR>     Documentation filed: Charges VN

## 2014-09-22 NOTE — Progress Notes (Signed)
Pt's urine drug screen came back as positive for cocaine, did you want to postpone Coreg dose?; Arvella Nigh RN

## 2014-09-22 NOTE — Progress Notes (Signed)
Paged by nurse patient BP low SBP 82 and more short of breath.   Patient in bed, laying flat and sleeping when I arrived. Does not appear to be volume overloaded. Complaining of some R side pain. Will order Ultram 50 mg q 6 hrs. Will place hold parameters on BP medications hold if SBP less than 85.   Will continue to follow.   Junie Bame B NP-C 5:23 PM

## 2014-09-22 NOTE — Progress Notes (Signed)
BP 85/65. Scheduled lasix and coreg not given. Will continue to monitor patient for further changes in condition.

## 2014-09-23 DIAGNOSIS — R0600 Dyspnea, unspecified: Secondary | ICD-10-CM

## 2014-09-23 LAB — BASIC METABOLIC PANEL
Anion gap: 14 (ref 5–15)
BUN: 18 mg/dL (ref 6–23)
CO2: 24 mEq/L (ref 19–32)
Calcium: 9 mg/dL (ref 8.4–10.5)
Chloride: 102 mEq/L (ref 96–112)
Creatinine, Ser: 1.06 mg/dL (ref 0.50–1.35)
GFR calc Af Amer: >90 mL/min (ref >90)
GFR calc non Af Amer: 79 mL/min — ABNORMAL LOW (ref >90)
Glucose, Bld: 180 mg/dL — ABNORMAL HIGH (ref 70–99)
Potassium: 4.3 mEq/L (ref 3.7–5.3)
Sodium: 140 mEq/L (ref 137–147)

## 2014-09-23 LAB — GLUCOSE, CAPILLARY
Glucose-Capillary: 109 mg/dL — ABNORMAL HIGH (ref 70–99)
Glucose-Capillary: 166 mg/dL — ABNORMAL HIGH (ref 70–99)
Glucose-Capillary: 213 mg/dL — ABNORMAL HIGH (ref 70–99)
Glucose-Capillary: 280 mg/dL — ABNORMAL HIGH (ref 70–99)

## 2014-09-23 LAB — PROTIME-INR
INR: 1.32 (ref 0.00–1.49)
Prothrombin Time: 16.5 seconds — ABNORMAL HIGH (ref 11.6–15.2)

## 2014-09-23 MED ORDER — WARFARIN SODIUM 10 MG PO TABS
10.0000 mg | ORAL_TABLET | Freq: Once | ORAL | Status: AC
  Administered 2014-09-23: 10 mg via ORAL
  Filled 2014-09-23: qty 1

## 2014-09-23 NOTE — Progress Notes (Signed)
ANTICOAGULATION CONSULT NOTE - Follow Up Consult  Pharmacy Consult:  Coumadin Indication:  Non-compaction cardiomyopathy  No Known Allergies  Patient Measurements: Height: 6\' 1"  (185.4 cm) Weight: 148 lb 2.4 oz (67.2 kg) IBW/kg (Calculated) : 79.9  Vital Signs: Temp: 98.4 F (36.9 C) (12/12 0458) Temp Source: Oral (12/12 0458) BP: 92/74 mmHg (12/12 0640) Pulse Rate: 99 (12/12 0640)  Labs:  Recent Labs  09/21/14 1928 09/22/14 0444 09/23/14 0350  HGB 14.8  --   --   HCT 46.4  --   --   PLT 244  --   --   LABPROT 15.0 15.0 16.5*  INR 1.17 1.16 1.32  CREATININE 1.17 1.15 1.06    Estimated Creatinine Clearance: 77.5 mL/min (by C-G formula based on Cr of 1.06).     Assessment: 62 YOM to continue on Coumadin for non-compaction cardiomyopathy that predisposes patient to embolic events.  INR sub-therapeutic on admit and patient was also started on prophylactic Lovenox.  INR remains sub-therapeutic but trending up.  No bleeding reported.   Goal of Therapy:  INR 2-3    Plan:  - Repeat Coumadin 10 PO today - Continue Lovenox 40mg  SQ Q24H until INR therapeutic - Daily PT / INR    Thuy D. Mina Marble, PharmD, BCPS Pager:  [phone] 09/23/2014, 12:59 PM

## 2014-09-23 NOTE — Progress Notes (Signed)
The patient's BP remained in the 90s/60s-70s overnight.  His blood sugar fell from 346 to 109 by midnight after giving him both insulins.  He was given a snack to keep his blood sugar from dropping further.

## 2014-09-23 NOTE — Progress Notes (Signed)
Patient ID: Dennis [name], male   DOB: [birth year], 53 y.o.   MRN: [id number]     Subjective:    SOB improving but not at baseline  Objective:   Temp:  [97.4 F (36.3 C)-98.4 F (36.9 C)] 98.4 F (36.9 C) (12/12 0458) Pulse Rate:  [99-103] 99 (12/12 0640) Resp:  [19-22] 20 (12/12 0458) BP: (82-96)/(65-74) 92/74 mmHg (12/12 0640) SpO2:  [98 %-100 %] 99 % (12/12 0458) Weight:  [148 lb 2.4 oz (67.2 kg)] 148 lb 2.4 oz (67.2 kg) (12/12 0458) Last BM Date: 09/22/14  Filed Weights   09/21/14 1702 09/22/14 0559 09/23/14 0458  Weight: 157 lb 12.8 oz (71.578 kg) 149 lb 14.6 oz (68 kg) 148 lb 2.4 oz (67.2 kg)    Intake/Output Summary (Last 24 hours) at 09/23/14 1144 Last data filed at 09/23/14 1122  Gross per 24 hour  Intake   1502 ml  Output   2675 ml  Net  -1173 ml    Telemetry: SR and sinus tach  Exam:  General: NAD  Resp: CTAB  Cardiac: RRR, no m/r/g, no JVD  GI: abdomen soft, NT, ND  MSK:no LE edema  Neuro: no focal deficits   Lab Results:  Basic Metabolic Panel:  Recent Labs Lab 09/21/14 1928 09/22/14 0444 09/23/14 0350  NA 138 142 140  K 4.5 4.0 4.3  CL 99 100 102  CO2 28 30 24   GLUCOSE 353* 229* 180*  BUN 16 17 18   CREATININE 1.17 1.15 1.06  CALCIUM 9.3 9.1 9.0  MG 1.9  --   --     Liver Function Tests:  Recent Labs Lab 09/21/14 1928  AST 38*  ALT 72*  ALKPHOS 138*  BILITOT 0.5  PROT 7.0  ALBUMIN 3.4*    CBC:  Recent Labs Lab 09/21/14 1928  WBC 9.0  HGB 14.8  HCT 46.4  MCV 93.7  PLT 244    Cardiac Enzymes: No results for input(s): CKTOTAL, CKMB, CKMBINDEX, TROPONINI in the last 168 hours.  BNP:  Recent Labs  05/26/14 1138 08/13/14 1850 09/21/14 1928  PROBNP 1034.0* 1525.0* 1870.0*    Coagulation:  Recent Labs Lab 09/21/14 1928 09/22/14 0444 09/23/14 0350  INR 1.17 1.16 1.32    ECG:   Medications:   Scheduled Medications: . atorvastatin  40 mg Oral q1800  . carvedilol  3.125 mg Oral BID WC  .  enoxaparin (LOVENOX) injection  40 mg Subcutaneous Q24H  . furosemide  80 mg Intravenous BID  . insulin aspart  0-20 Units Subcutaneous TID WC  . insulin aspart  0-5 Units Subcutaneous QHS  . insulin glargine  32 Units Subcutaneous QHS  . losartan  25 mg Oral BID  . sodium chloride  3 mL Intravenous Q12H  . Warfarin - Pharmacist Dosing Inpatient   Does not apply q1800     Infusions:     PRN Medications:  sodium chloride, acetaminophen, guaifenesin, ondansetron (ZOFRAN) IV, sodium chloride, traMADol     Assessment/Plan   1. Acute on chronic systolic HF - mixed NICM/ICM, from notes there has been some question of non-compaction CM -admitted with SOB, he had been off meds and was cocaine + - continue coreg and losartan - negative 1.1 liters yesterday, net negative 5 liters since admission. He is on lasix 80mg  bid, Cr and BUN stable. Recs per CHF to restart lasix 40mg  daily at discharge.   - still with some symptoms at this time, will continue IV diuretics. Have nursing ambulate patient and document  symptoms and O2 sats. Consider d/c potentially tomorrow w/ close CHF f/u  2. 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 Subtherapeutic INR, dosing per pharmacy  3. MSK pain - right rib cage pain constant, worst with position. PRN tylenol.        Carlyle Dolly, M.D.

## 2014-09-23 NOTE — Progress Notes (Signed)
CARDIAC REHAB PHASE I   PRE:  Rate/Rhythm: 111  BP:  Sitting: 117/93     SaO2: 94% RA  MODE:  Ambulation: 400 ft   POST:  Rate/Rhythm: 106  BP:  Sitting: 90/77     SaO2: 100%  1:50pm-2:38pm  Patient ambulated at a slow pace.  He needed to stop and take one rest break due to shortness of breath.  Towards the end of the walk patient was complaining of leg fatigue and 8/10 right sided pain.  He states that he has had this same pain since he came into the hospital.  Patient was educated on risk factors, exercise, smoking cessation, and nutrition.  Patient states that he is interested in coming to Cardiac Rehab at [facility].  He stated that hearing about his risk factors and how Cardiac Rehab can help him get on a better path has "opened up his eyes."  He seems very motivated to make life changes.  Patient was left lying down in bed with call bell in reach.    Dennis Morales, MS 09/23/2014 2:33 PM

## 2014-09-23 NOTE — Plan of Care (Signed)
Problem: Phase I Progression Outcomes Goal: EF % per last Echo/documented,Core Reminder form on chart Outcome: Progressing EF 20% as of 09/21/14.

## 2014-09-24 LAB — BASIC METABOLIC PANEL
Anion gap: 16 — ABNORMAL HIGH (ref 5–15)
BUN: 25 mg/dL — ABNORMAL HIGH (ref 6–23)
CO2: 24 mEq/L (ref 19–32)
Calcium: 8.7 mg/dL (ref 8.4–10.5)
Chloride: 96 mEq/L (ref 96–112)
Creatinine, Ser: 1.13 mg/dL (ref 0.50–1.35)
GFR calc Af Amer: 85 mL/min — ABNORMAL LOW (ref >90)
GFR calc non Af Amer: 73 mL/min — ABNORMAL LOW (ref >90)
Glucose, Bld: 240 mg/dL — ABNORMAL HIGH (ref 70–99)
Potassium: 4.3 mEq/L (ref 3.7–5.3)
Sodium: 136 mEq/L — ABNORMAL LOW (ref 137–147)

## 2014-09-24 LAB — GLUCOSE, CAPILLARY
Glucose-Capillary: 251 mg/dL — ABNORMAL HIGH (ref 70–99)
Glucose-Capillary: 186 mg/dL — ABNORMAL HIGH (ref 70–99)
Glucose-Capillary: 204 mg/dL — ABNORMAL HIGH (ref 70–99)
Glucose-Capillary: 90 mg/dL (ref 70–99)
Glucose-Capillary: 181 mg/dL — ABNORMAL HIGH (ref 70–99)
Glucose-Capillary: 157 mg/dL — ABNORMAL HIGH (ref 70–99)

## 2014-09-24 LAB — PROTIME-INR
INR: 1.7 — ABNORMAL HIGH (ref 0.00–1.49)
Prothrombin Time: 20.2 seconds — ABNORMAL HIGH (ref 11.6–15.2)

## 2014-09-24 LAB — MAGNESIUM: Magnesium: 2 mg/dL (ref 1.5–2.5)

## 2014-09-24 MED ORDER — WARFARIN SODIUM 6 MG PO TABS
7.0000 mg | ORAL_TABLET | Freq: Once | ORAL | Status: AC
  Administered 2014-09-24: 7 mg via ORAL
  Filled 2014-09-24: qty 1

## 2014-09-24 NOTE — Progress Notes (Signed)
ANTICOAGULATION CONSULT NOTE - Follow Up Consult  Pharmacy Consult:  Coumadin Indication:  Non-compaction cardiomyopathy  No Known Allergies  Patient Measurements: Height: 6\' 1"  (185.4 cm) Weight: 145 lb 11.2 oz (66.089 kg) IBW/kg (Calculated) : 79.9  Vital Signs: Temp: 97.5 F (36.4 C) (12/13 0840) Temp Source: Oral (12/13 0840) BP: 80/59 mmHg (12/13 0840) Pulse Rate: 100 (12/13 0840)  Labs:  Recent Labs  09/21/14 1928 09/22/14 0444 09/23/14 0350 09/24/14 0256  HGB 14.8  --   --   --   HCT 46.4  --   --   --   PLT 244  --   --   --   LABPROT 15.0 15.0 16.5* 20.2*  INR 1.17 1.16 1.32 1.70*  CREATININE 1.17 1.15 1.06 1.13    Estimated Creatinine Clearance: 71.5 mL/min (by C-G formula based on Cr of 1.13).     Assessment: 8 YOM to continue on Coumadin for non-compaction cardiomyopathy that predisposes patient to embolic events.  INR sub-therapeutic on admit and patient was also started on prophylactic Lovenox.  INR remains sub-therapeutic but trending up.  No bleeding reported.   Goal of Therapy:  INR 2-3    Plan:  - Coumadin 7mg  PO today - Continue Lovenox 40mg  SQ Q24H until INR therapeutic - Daily PT / INR - F/U insulin adjustment for better glycemic control   Thuy D. Mina Marble, PharmD, BCPS Pager:  [phone] 09/24/2014, 11:23 AM

## 2014-09-24 NOTE — Progress Notes (Signed)
Patient ID: Hermes Wafer, male   DOB: 12-Jul-1961, 53 y.o.   MRN: [id number]      Subjective:    Mild SOB this AM  Objective:   Temp:  [97.4 F (36.3 C)-97.9 F (36.6 C)] 97.5 F (36.4 C) (12/13 0840) Pulse Rate:  [96-108] 100 (12/13 0840) Resp:  [18-19] 18 (12/13 0840) BP: (80-99)/(50-69) 80/59 mmHg (12/13 0840) SpO2:  [96 %-100 %] 100 % (12/13 0840) Weight:  [145 lb 11.2 oz (66.089 kg)-149 lb 4.8 oz (67.722 kg)] 145 lb 11.2 oz (66.089 kg) (12/13 0616) Last BM Date: 09/21/14  Filed Weights   09/23/14 0458 09/24/14 0341 09/24/14 0616  Weight: 148 lb 2.4 oz (67.2 kg) 149 lb 4.8 oz (67.722 kg) 145 lb 11.2 oz (66.089 kg)    Intake/Output Summary (Last 24 hours) at 09/24/14 0850 Last data filed at 09/24/14 0841  Gross per 24 hour  Intake   2160 ml  Output   1825 ml  Net    335 ml    Telemetry: SR and sinus tach  Exam:  General: NAD   Resp: CTAB  Cardiac: RRR, no m/r/g, no JVD  GI: abdomen soft, NT, ND  MSK: no LE edema  Neuro: no focal deficits    Lab Results:  Basic Metabolic Panel:  Recent Labs Lab 09/21/14 1928 09/22/14 0444 09/23/14 0350 09/24/14 0256  NA 138 142 140 136*  K 4.5 4.0 4.3 4.3  CL 99 100 102 96  CO2 28 30 24 24   GLUCOSE 353* 229* 180* 240*  BUN 16 17 18  25*  CREATININE 1.17 1.15 1.06 1.13  CALCIUM 9.3 9.1 9.0 8.7  MG 1.9  --   --  2.0    Liver Function Tests:  Recent Labs Lab 09/21/14 1928  AST 38*  ALT 72*  ALKPHOS 138*  BILITOT 0.5  PROT 7.0  ALBUMIN 3.4*    CBC:  Recent Labs Lab 09/21/14 1928  WBC 9.0  HGB 14.8  HCT 46.4  MCV 93.7  PLT 244    Cardiac Enzymes: No results for input(s): CKTOTAL, CKMB, CKMBINDEX, TROPONINI in the last 168 hours.  BNP:  Recent Labs  05/26/14 1138 08/13/14 1850 09/21/14 1928  PROBNP 1034.0* 1525.0* 1870.0*    Coagulation:  Recent Labs Lab 09/22/14 0444 09/23/14 0350 09/24/14 0256  INR 1.16 1.32 1.70*    ECG:   Medications:   Scheduled Medications: .  atorvastatin  40 mg Oral q1800  . carvedilol  3.125 mg Oral BID WC  . enoxaparin (LOVENOX) injection  40 mg Subcutaneous Q24H  . furosemide  80 mg Intravenous BID  . insulin aspart  0-20 Units Subcutaneous TID WC  . insulin aspart  0-5 Units Subcutaneous QHS  . insulin glargine  32 Units Subcutaneous QHS  . losartan  25 mg Oral BID  . sodium chloride  3 mL Intravenous Q12H  . Warfarin - Pharmacist Dosing Inpatient   Does not apply q1800     Infusions:     PRN Medications:  sodium chloride, acetaminophen, guaifenesin, ondansetron (ZOFRAN) IV, sodium chloride, traMADol     Assessment/Plan    1. Acute on chronic systolic HF - mixed NICM/ICM, from notes there has been some question of non-compaction CM -admitted with SOB, he had been off meds and was cocaine + - negative 25 mL yesterday, net negative 5 liters since admission. He is on lasix 80mg  bid, mild uptrend in Cr and BUN. BP has been slowly trending down, this AM 80/60. Urine appears concentrated  in urinal - hold IV lasix, hold AM coreg and losartan. Suspect he may be overdiuresed. He had also been off his coreg and losartan at home, these were reintroduced this admission. Will hold both, pending bp in evening consider giving that dose of coreg.    2. 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 Subtherapeutic INR, dosing per pharmacy  3. MSK pain - right rib cage pain constant, worst with position. PRN tylenol.        Carlyle Dolly, M.D.

## 2014-09-24 NOTE — Plan of Care (Signed)
Problem: Phase II Progression Outcomes Goal: Walk in hall or up in chair TID Outcome: Adequate for Discharge Ambulated in hallway without complaints of discomfort.  Tolerated well.

## 2014-09-24 NOTE — Progress Notes (Signed)
The patient's BP was in the mid-upper 80s/60s overnight.  His Coreg was not given this morning per the order for a BP of 84/65.  He was given tramadolx1 overnight with relief for pain in his right rib area.

## 2014-09-25 LAB — PROTIME-INR
INR: 2.05 — ABNORMAL HIGH (ref 0.00–1.49)
Prothrombin Time: 23.3 seconds — ABNORMAL HIGH (ref 11.6–15.2)

## 2014-09-25 LAB — GLUCOSE, CAPILLARY
Glucose-Capillary: 158 mg/dL — ABNORMAL HIGH (ref 70–99)
Glucose-Capillary: 135 mg/dL — ABNORMAL HIGH (ref 70–99)

## 2014-09-25 LAB — CBC
HCT: 46.2 % (ref 39.0–52.0)
Hemoglobin: 14.9 g/dL (ref 13.0–17.0)
MCH: 30.2 pg (ref 26.0–34.0)
MCHC: 32.3 g/dL (ref 30.0–36.0)
MCV: 93.5 fL (ref 78.0–100.0)
Platelets: 253 10*3/uL (ref 150–400)
RBC: 4.94 MIL/uL (ref 4.22–5.81)
RDW: 12.7 % (ref 11.5–15.5)
WBC: 8.4 10*3/uL (ref 4.0–10.5)

## 2014-09-25 MED ORDER — ATORVASTATIN CALCIUM 40 MG PO TABS: 40.0000 mg | ORAL_TABLET | Freq: Every day | ORAL | Status: DC

## 2014-09-25 MED ORDER — LOSARTAN POTASSIUM 25 MG PO TABS
12.5000 mg | ORAL_TABLET | Freq: Every day | ORAL | Status: DC
  Administered 2014-09-25: 12.5 mg via ORAL
  Filled 2014-09-25: qty 0.5

## 2014-09-25 MED ORDER — FUROSEMIDE 20 MG PO TABS: 40.0000 mg | ORAL_TABLET | Freq: Every day | ORAL | Status: DC

## 2014-09-25 MED ORDER — FUROSEMIDE 40 MG PO TABS
40.0000 mg | ORAL_TABLET | Freq: Every day | ORAL | Status: DC
  Administered 2014-09-25: 40 mg via ORAL
  Filled 2014-09-25: qty 1

## 2014-09-25 MED ORDER — TRAMADOL HCL 50 MG PO TABS: 50.0000 mg | ORAL_TABLET | Freq: Two times a day (BID) | ORAL | Status: DC | PRN

## 2014-09-25 NOTE — Plan of Care (Signed)
Problem: Phase I Progression Outcomes Goal: EF % per last Echo/documented,Core Reminder form on chart EF 20%

## 2014-09-25 NOTE — Progress Notes (Signed)
The patient's blood pressure was in the 90s/60-70s overnight and his Coreg was given this morning.  He received Ultram once overnight for right rib cage pain.  He also stated that he was still experiencing intermittent SOB this morning.

## 2014-09-25 NOTE — Progress Notes (Signed)
ANTICOAGULATION CONSULT NOTE - Follow Up Consult  Pharmacy Consult:  Coumadin Indication:  Non-compaction cardiomyopathy  No Known Allergies  Patient Measurements: Height: 6\' 1"  (185.4 cm) Weight: 152 lb 5.4 oz (69.1 kg) IBW/kg (Calculated) : 79.9  Vital Signs: Temp: 98 F (36.7 C) (12/14 0511) Temp Source: Oral (12/14 0511) BP: 89/65 mmHg (12/14 0956) Pulse Rate: 103 (12/14 0956)  Labs:  Recent Labs  09/23/14 0350 09/24/14 0256 09/25/14 0350  HGB  --   --  14.9  HCT  --   --  46.2  PLT  --   --  253  LABPROT 16.5* 20.2* 23.3*  INR 1.32 1.70* 2.05*  CREATININE 1.06 1.13  --     Estimated Creatinine Clearance: 74.7 mL/min (by C-G formula based on Cr of 1.13).     Assessment: 46 YOM to continue on Coumadin for non-compaction cardiomyopathy that predisposes patient to embolic events.  INR sub-therapeutic on admit and patient was also started on prophylactic Lovenox.  INR at goal  No bleeding reported.   Goal of Therapy:  INR 2-3    Plan:  - Coumadin 7mg  PO today -- Anticipate DC home today to follow up at [facility] coumadin clinic, INR next week  Thank you for allowing pharmacy to be a part of this patients care team.  Rowe Robert Pharm.D., BCPS, AQ-Cardiology Clinical Pharmacist 09/25/2014 10:05 AM Pager: [phone] Phone: [phone]

## 2014-09-25 NOTE — Progress Notes (Signed)
Pt walking independently. Sts he is still somewhat SOB. Encouraged him to slowly increase duration. Reviewed ed with pt. Very receptive and good teach back. Could be eligible for CRPII in 6 weeks. [place], ACSM 12:17 PM 09/25/2014

## 2014-09-25 NOTE — Progress Notes (Signed)
Pharmacist HF Discharge Medication Education   Patient was educated on process for home medication reconciliation and provided written instructions as well. Patient was also counseled on HF medications and changes to be made upon arriving home. He was provided with the "Living Better with Heart Failure" patient education packet and a medication bag to be brought with him to his follow up clinic visits with his current medications.    Dennis [name]. Dennis [name], PharmD Clinical Pharmacist - Resident Pager: [phone] Pharmacy: [phone] 09/25/2014 12:55 PM

## 2014-09-25 NOTE — Progress Notes (Signed)
Heart Failure Navigator Consult Note  Presentation: Dennis [name] is a 53 y/o male with h/o DM2, polysubstance use (tobacco, cocaine), mixed ICM/NICM and chronic systolic HF due to ventricular noncompaction and possible ischemic heart disease (reportedly had a collateralized LAD on last cath).   He returns for an acute work in due to increased dyspnea. Has missed several appointments. Increased dyspnea at rest and with exertion. He has been out of all medications for about a week and a half. Has not had coumadin in the last week and a half.Says he has cough for weeks. Has been taking cough drops. Productive sputum with tan color. + Orthopnea. No fever. Smokes 2 cigarettes per day. Denies cocaine use over the last month. Weight at home 153-156 pounds.   Past Medical History  Diagnosis Date  . Chronic systolic heart failure     a) Mixed ICM/NICM b) RHC (05/2014): RA 2, RV 19/2/3, PA 22/14 (18), PCWP 6, Fick CO/CI: 5.2 / 2.7, PVR 2.3 WU, PA 60% and 64% c) ECHO (05/2014): EF 20-25%, diff HK, akinesis entireanteroseptal myocardium, triv AI, mod MR, LA mod/sev dilated  . Drug abuse and dependence   . Automatic implantable cardioverter-defibrillator in situ   . Heart murmur   . Sleep apnea     "cleared after T&A"  . HTN (hypertension) 03/04/2013  . Left ventricular noncompaction   . Ischemic cardiomyopathy     a) Coronary angiography (12/2008) at [facility]: Lmain: nl, LAD mid 100% stenosis with left to left and right-to-left collaterals to the distal LAD; Lcx: nl, RCA nl.    . Type II diabetes mellitus   . High cholesterol   . Pneumonia 1988    History   Social History  . Marital Status: Single    Spouse Name: N/A    Number of Children: 1  . Years of Education: N/A   Occupational History  . Disabled    Social History Main Topics  . Smoking status: Current Every Day Smoker -- 0.10 packs/day for 31 years    Types: Cigarettes  . Smokeless tobacco: Never Used  . Alcohol Use: Yes   Comment: 09/21/2014 "might have a drink q 6 /months, if that"  . Drug Use: Yes    Special: Cocaine     Comment: 09/21/2014 "last cocaine ~ 1 1/2 months ago"  . Sexual Activity: Yes   Other Topics Concern  . None   Social History Narrative   Lives in [place] with his parents.     ECHO:Study Conclusions--09/21/14  - Left ventricle: The cavity size was moderately to severely dilated. Wall thickness was normal. The estimated ejection fraction was 20%. Diffuse hypokinesis. - Mitral valve: There was mild to moderate regurgitation. - Left atrium: The atrium was moderately dilated. - Right ventricle: The cavity size was mildly dilated. Systolic function was moderately reduced. - Pulmonary arteries: PA peak pressure: 40 mm Hg (S).  Transthoracic echocardiography. M-mode, complete 2D, spectral Doppler, and color Doppler. Birthdate: Patient birthdate: 1961/05/23. Age: Patient is 53 yr old. Sex: Gender: male. BMI: 20.7 kg/m^2. Blood pressure:   125/75 Patient status: Inpatient. Study date: Study date: 09/21/2014. Study time: 05:04 PM. Location: Bedside.  ------------------------------------------------------------------- BNP    Component Value Date/Time   PROBNP 1870.0* 09/21/2014 1928    Education Assessment and Provision:  Detailed education and instructions provided on heart failure disease management including the following:  Signs and symptoms of Heart Failure When to call the physician Importance of daily weights Low sodium diet Fluid restriction Medication management  Anticipated future follow-up appointments  Patient education given on each of the above topics.  Patient acknowledges understanding and acceptance of all instructions.  I know Dennis [name] from previously seeing him while inpatient as well as in our outpatient clinic.  He is able to teach back all topics listed above.  He does admit that prior to coming to the hospital-he had been out of  medications for approx. 1 week.  I have instructed him to contact our clinic if he is at risk to run out of medications in the future--so that we can assist with that.  He also admits dietary indiscretion-- that he "really likes hotdogs" and eats them often.  We discussed low sodium diet and high sodium foods to avoid as well as fluid restriction.    Education Materials:  "Living Better With Heart Failure" Booklet, Daily Weight Tracker Tool   High Risk Criteria for Readmission and/or Poor Patient Outcomes:   EF <30%- yes 20%  2 or more admissions in 6 months- Yes  Difficult social situation-  Yes-polysubstance abuse--Lives with brother and other family  Demonstrates medication noncompliance- Yes--recently missed approx 1 week of meds  Barriers of Care:  Knowledge, Polysubstance abuse and compliance related to previous  Discharge Planning:  Plans to discharge to home with brother and other family.

## 2014-09-25 NOTE — Progress Notes (Addendum)
Patient ID: Dennis [name], male   DOB: 06-Mar-1961, 53 y.o.   MRN: [id number]   SUBJECTIVE: Breathing back to normal.  SBP runs 90s-100s, no lightheadedness.  Right-sided chest pain well-controlled with tramadol.  CXR with emphysema, no comment on rib fracture.   Scheduled Meds: . atorvastatin  40 mg Oral q1800  . carvedilol  3.125 mg Oral BID WC  . enoxaparin (LOVENOX) injection  40 mg Subcutaneous Q24H  . furosemide  40 mg Oral Daily  . insulin aspart  0-20 Units Subcutaneous TID WC  . insulin aspart  0-5 Units Subcutaneous QHS  . insulin glargine  32 Units Subcutaneous QHS  . losartan  12.5 mg Oral Daily  . sodium chloride  3 mL Intravenous Q12H  . Warfarin - Pharmacist Dosing Inpatient   Does not apply q1800   Continuous Infusions:  PRN Meds:.sodium chloride, acetaminophen, guaifenesin, ondansetron (ZOFRAN) IV, sodium chloride, traMADol    Filed Vitals:   09/24/14 1415 09/24/14 1630 09/24/14 2119 09/25/14 0511  BP: 97/64 84/52 92/66  92/71  Pulse: 103 100 100 99  Temp: 97.9 F (36.6 C)  97.8 F (36.6 C) 98 F (36.7 C)  TempSrc: Oral  Oral Oral  Resp: 18  17 19   Height:      Weight:    152 lb 5.4 oz (69.1 kg)  SpO2: 100%  100% 98%    Intake/Output Summary (Last 24 hours) at 09/25/14 0816 Last data filed at 09/25/14 0516  Gross per 24 hour  Intake   1282 ml  Output   1150 ml  Net    132 ml    LABS: Basic Metabolic Panel:  Recent Labs  09/23/14 0350 09/24/14 0256  NA 140 136*  K 4.3 4.3  CL 102 96  CO2 24 24  GLUCOSE 180* 240*  BUN 18 25*  CREATININE 1.06 1.13  CALCIUM 9.0 8.7  MG  --  2.0   Liver Function Tests: No results for input(s): AST, ALT, ALKPHOS, BILITOT, PROT, ALBUMIN in the last 72 hours. No results for input(s): LIPASE, AMYLASE in the last 72 hours. CBC:  Recent Labs  09/25/14 0350  WBC 8.4  HGB 14.9  HCT 46.2  MCV 93.5  PLT 253   Cardiac Enzymes: No results for input(s): CKTOTAL, CKMB, CKMBINDEX, TROPONINI in the last 72  hours. BNP: Invalid input(s): POCBNP D-Dimer: No results for input(s): DDIMER in the last 72 hours. Hemoglobin A1C: No results for input(s): HGBA1C in the last 72 hours. Fasting Lipid Panel: No results for input(s): CHOL, HDL, LDLCALC, TRIG, CHOLHDL, LDLDIRECT in the last 72 hours. Thyroid Function Tests: No results for input(s): TSH, T4TOTAL, T3FREE, THYROIDAB in the last 72 hours.  Invalid input(s): FREET3 Anemia Panel: No results for input(s): VITAMINB12, FOLATE, FERRITIN, TIBC, IRON, RETICCTPCT in the last 72 hours.  RADIOLOGY: Dg Chest 2 View  09/22/2014   CLINICAL DATA:  Shortness of breath.  Right-sided chest pain.  EXAM: CHEST  2 VIEW  COMPARISON:  Two-view chest 08/13/2014.  FINDINGS: The heart is enlarged. There is no edema or effusion to suggest failure. Emphysematous changes are again noted. Chronic interstitial changes are present. Aeration at the right base is improved. No new airspace opacification is evident. Pacing wires are stable.  The visualized soft tissues and bony thorax are unremarkable.  IMPRESSION: 1. Stable cardiomegaly without failure. 2. Improved aeration at the lung bases. 3. Stable emphysema and interstitial disease.   Electronically Signed   By: Lawrence Santiago M.D.   On: 09/22/2014 07:59  PHYSICAL EXAM General: NAD Neck: JVP 7 cm, no thyromegaly or thyroid nodule.  Lungs: Distant breath sounds bilaterally. CV: Nondisplaced PMI.  Heart mildly tachy, regular S1/S2, no S3/S4, no murmur.  No peripheral edema.  No carotid bruit.  Normal pedal pulses.  Abdomen: Soft, nontender, no hepatosplenomegaly, no distention.  Neurologic: Alert and oriented x 3.  Psych: Normal affect. Extremities: No clubbing or cyanosis.   TELEMETRY: Reviewed telemetry pt in sinus tachycardia  ASSESSMENT AND PLAN: 1) Chronic systolic HF: Mixed ICM/NICM (suspect noncompaction) s/p ICD, EF 20-25%, mod MR (05/2014). St Jude ICD. NYHA III/IV, admitted with dyspnea at rest and pleuritic  right chest pain + cough. He was off all his medications.  He was cocaine+. He diuresed well, volume status improved. BP has been soft. - Start Lasix 40 mg daily, will go home on this.  - Continue Coreg 3.125 mg bid, discussed risk of cocaine with him.  This is an alpha and beta blocker so risk is lower.  - Home on losartan 12.5 mg daily for now, titrate up as able as outpatient.   2) CAD: Coronary angiography (12/2008): 12/2008): mid LAD totally occluded and collateralized. Continue statin and warfarin (no ASA with warfarin use).  3) Left ventricular non-compaction: Has been on coumadin for this long-term. Hehad been off coumadin for 1 1/2 weeks as he is no longer going to [facility]. We have restarted coumadin, need to transition INR followup to [facility] from [facility]. 4) DM2: Uncontrolled.  Needs to establish PCP followup.  5) Tobacco and drug abuse: Current smoker, needs to quit (emphysema on CXR).  Also needs to stop cocaine.  6) Pleuritic right chest pain: Tender over right lateral ribs.  ?Strained muscle versus fractured rib.  CXR did comment on fracture.  Low risk that this is PE.  D dimer normal.  7) Disposition: Home today.  CHF clinic followup 1 week, needs INR followup at [place] meds will be atorvastatin 40 daily, warfarin, Lasix 40 mg po daily, losartan 12.5 mg daily, Coreg 3.125 mg bid, insulin per home dosing.  He can have tramadol 50 mg bid prn pain x 14 pills for rib pain. He needs followup with a PCP, perhaps establish with family practice or internal medicine practice at the hospital.   Loralie Champagne 09/25/2014 8:16 AM

## 2014-09-26 NOTE — Discharge Summary (Signed)
Advanced Heart Failure Team  Discharge Summary   Patient ID: Dennis [name] MRN: [id number], DOB/AGE: 52-Jan-1962 52 y.o. 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 St Jude ICD.  2) CAD: Coronary angiography (12/2008): 12/2008): mid LAD totally occluded and collateralized. Continue statin and warfarin (no ASA with warfarin use).  3) Left ventricular non-compaction: Has been on coumadin for this long-term. IN[facility] followup to [facility] 4) DM2: Uncontrolled. Has follow up with Diabetes and [place]. Marland Kitchen  5) Tobacco and drug abuse: Current smoker, needs to quit (emphysema on CXR). Also needs to stop cocaine.  6) Pleuritic right chest pain 7) Cocaine Abuse- + UDS this admit   Hospital Course:  53 y/o male with h/o DM2, polysubstance use (tobacco, cocaine), mixed ICM/NICM and chronic systolic HF due to ventricular noncompaction and possible ischemic heart disease (reportedly had a collateralized LAD on last cath).   Admitted from HF clinic with increased dyspnea and volume overload. Prior to admit he was not taking HF medications for over a week. He also complained of rib pain on the right. CXR was ok with no evidence of rib fractures and pain was thought to be from coughing related to fluid.  He was diuresed with IV lasix and transitioned to lasix 40 mg daily. He was restarted on HF meds: to include carvedilol 3.125 mg twice a day and losartan 12.5 mg daily. He was not placed on spironolactone or digoxin due to non compliance.    Diabetes Coordinator was consulted for elevated A1C with adjustments made to regimen. He was also referred to Nutrition and Diabetes [facility]. He was referred to Coumadin Clinic for INR management.   He will continue to be followed closely in the HF clinic and has follow up next week. HF SW will also meet with  regarding substance abuse as he + for cocaine this admit.   Discharge Weight Range: D/C 152 pounds.  Discharge Vitals: Blood pressure 89/65, pulse 103, temperature 98 F (36.7 C), temperature source Oral, resp. rate 19, height 6\' 1"  (1.854 m), weight 152 lb 5.4 oz (69.1 kg), SpO2 96 %.  Labs: Lab Results  Component Value Date   WBC 8.4 09/25/2014   HGB 14.9 09/25/2014   HCT 46.2 09/25/2014   MCV 93.5 09/25/2014   PLT 253 09/25/2014    Recent Labs Lab 09/21/14 1928  09/24/14 0256  NA 138  < > 136*  K 4.5  < > 4.3  CL 99  < > 96  CO2 28  < > 24  BUN 16  < > 25*  CREATININE 1.17  < > 1.13  CALCIUM 9.3  < > 8.7  PROT 7.0  --   --   BILITOT 0.5  --   --   ALKPHOS 138*  --   --   ALT 72*  --   --   AST 38*  --   --   GLUCOSE 353*  < > 240*  < > = values in this interval not displayed. Lab Results  Component Value Date   CHOL 156 08/10/2013   HDL 73 08/10/2013   LDLCALC 72 08/10/2013   TRIG 57 08/10/2013   BNP (last 3 results)  Recent Labs  05/26/14 1138 08/13/14 1850 09/21/14 1928  PROBNP 1034.0* 1525.0* 1870.0*    Diagnostic Studies/Procedures   No results found.  Discharge  Medications     Medication List    STOP taking these medications        simvastatin 80 MG tablet  Commonly known as:  ZOCOR     spironolactone 12.5 mg Tabs tablet  Commonly known as:  ALDACTONE      TAKE these medications        acetaminophen 500 MG tablet  Commonly known as:  TYLENOL  Take 1,000 mg by mouth every 6 (six) hours as needed for moderate pain.     atorvastatin 40 MG tablet  Commonly known as:  LIPITOR  Take 1 tablet (40 mg total) by mouth daily at 6 PM.     carvedilol 3.125 MG tablet  Commonly known as:  COREG  Take 1 tablet (3.125 mg total) by mouth 2 (two) times daily with a meal.     furosemide 20 MG tablet  Commonly known as:  LASIX  Take 2 tablets (40 mg total) by mouth daily. Or as instructed by provider.     insulin glargine 100 UNIT/ML injection   Commonly known as:  LANTUS  Inject 32 Units into the skin at bedtime.     losartan 25 MG tablet  Commonly known as:  COZAAR  Take 0.5 tablets (12.5 mg total) by mouth daily.     QC PEN NEEDLES 31G X 6 MM Misc  Generic drug:  Insulin Pen Needle  1 each by Other route See admin instructions. Use daily with insulin.     traMADol 50 MG tablet  Commonly known as:  ULTRAM  Take 1 tablet (50 mg total) by mouth every 12 (twelve) hours as needed for moderate pain.     warfarin 5 MG tablet  Commonly known as:  COUMADIN  Take 1 tablet (5 mg total) by mouth daily.     warfarin 2 MG tablet  Commonly known as:  COUMADIN  Take 1 tablet (2 mg total) by mouth daily.        Disposition   The patient will be discharged in stable condition to home.     Discharge Instructions    ACE Inhibitor / ARB already ordered    Complete by:  As directed      Amb Referral to Cardiac Rehabilitation    Complete by:  As directed      Ambulatory referral to Nutrition and Diabetic Education    Complete by:  As directed      Diet - low sodium heart healthy    Complete by:  As directed      Heart Failure patients record your daily weight using the same scale at the same time of day    Complete by:  As directed      Increase activity slowly    Complete by:  As directed           Follow-up Information    Follow up with [facility] On 09/29/2014.   Specialty:  Cardiology   Why:  @ 12:00 pm. Gate code 0006, please bring all your medications to your visit.    Contact information:   [address] [id number] [place] [phone]      Follow up with [facility] On 10/02/2014.   Why:  at 3:30    Contact information:   [address] [place] [address]       Follow up with [facility] On  10/02/2014.   Specialty:  Cardiology   Why:  @ 9:40 am; [place]; Please bring all your medications to your visit.    Contact information:   [address] [id number] [place] [phone]        Duration of Discharge Encounter: Greater than 35 minutes   Signed, CLEGG,AMY NP-C  09/26/2014, 2:56 PM

## 2014-09-27 ENCOUNTER — Telehealth (HOSPITAL_COMMUNITY): Payer: Self-pay | Admitting: Vascular Surgery

## 2014-09-27 NOTE — Telephone Encounter (Addendum)
Pt called he would like a return back to work letter he was D/C from hosp . He is going back this evening he wants to pick it up today.. Please advise PT CALLED AGAIN HE WANTS TO PICK THE LETTER UP TODAY @ 4:30 CAN SOMEONE  CALL HIM TO LET HIM KNOW IF THIS IS POSSIBLE.Marland Kitchen

## 2014-09-27 NOTE — Telephone Encounter (Signed)
Unsure if patient is cleared to return to work as we have not seen the patient in the CHF clinic post discharge What type of work does the pt perform? How many hours? Any restrictions Will need to clarify some information before letter is written  Left detailed message for pt to return call.

## 2014-10-02 ENCOUNTER — Ambulatory Visit (HOSPITAL_COMMUNITY)
Admission: RE | Admit: 2014-10-02 | Discharge: 2014-10-02 | Disposition: A | Discharge status: Home / Self Care | Payer: Medicare Other | Source: Ambulatory Visit | Attending: Internal Medicine | Admitting: Internal Medicine

## 2014-10-02 ENCOUNTER — Encounter (HOSPITAL_COMMUNITY): Payer: Self-pay

## 2014-10-02 ENCOUNTER — Encounter: Payer: Self-pay | Admitting: Licensed Clinical Social Worker

## 2014-10-02 VITALS — BP 98/52 | HR 102 | Ht 72.0 in | Wt 149.5 lb

## 2014-10-02 DIAGNOSIS — F1920 Other psychoactive substance dependence, uncomplicated: Secondary | ICD-10-CM | POA: Diagnosis not present

## 2014-10-02 DIAGNOSIS — I428 Other cardiomyopathies: Secondary | ICD-10-CM | POA: Insufficient documentation

## 2014-10-02 DIAGNOSIS — I5022 Chronic systolic (congestive) heart failure: Secondary | ICD-10-CM | POA: Diagnosis present

## 2014-10-02 DIAGNOSIS — I2510 Atherosclerotic heart disease of native coronary artery without angina pectoris: Secondary | ICD-10-CM | POA: Diagnosis not present

## 2014-10-02 DIAGNOSIS — F17200 Nicotine dependence, unspecified, uncomplicated: Secondary | ICD-10-CM

## 2014-10-02 DIAGNOSIS — Z720 Tobacco use: Secondary | ICD-10-CM | POA: Diagnosis not present

## 2014-10-02 DIAGNOSIS — E119 Type 2 diabetes mellitus without complications: Secondary | ICD-10-CM | POA: Diagnosis not present

## 2014-10-02 LAB — BASIC METABOLIC PANEL
Anion gap: 16 — ABNORMAL HIGH (ref 5–15)
BUN: 14 mg/dL (ref 6–23)
CO2: 21 mEq/L (ref 19–32)
Calcium: 9.6 mg/dL (ref 8.4–10.5)
Chloride: 98 mEq/L (ref 96–112)
Creatinine, Ser: 0.99 mg/dL (ref 0.50–1.35)
GFR calc Af Amer: >90 mL/min (ref >90)
GFR calc non Af Amer: >90 mL/min (ref >90)
Glucose, Bld: 287 mg/dL — ABNORMAL HIGH (ref 70–99)
Potassium: 4.8 mEq/L (ref 3.7–5.3)
Sodium: 135 mEq/L — ABNORMAL LOW (ref 137–147)

## 2014-10-02 LAB — PRO B NATRIURETIC PEPTIDE: Pro B Natriuretic peptide (BNP): 1085 pg/mL — ABNORMAL HIGH (ref 0–125)

## 2014-10-02 MED ORDER — LOSARTAN POTASSIUM 25 MG PO TABS: 25.0000 mg | ORAL_TABLET | Freq: Every day | ORAL | Status: DC

## 2014-10-02 NOTE — Progress Notes (Signed)
Patient ID: Dennis [name], male   DOB: 19-Feb-1961, 53 y.o.   MRN: [id number]  PCP: Lacey Jensen Primary Cardiologist: previously Dr. [name] [facility])  HPI: 53 y/o male with h/o DM2, polysubstance use (tobacco, cocaine), mixed ICM/NICM and chronic systolic HF due to ventricular noncompaction and possible ischemic heart disease (reportedly had a collateralized LAD on last cath).   Post Hospital Follow up for Heart Failure: Feeling better than what he felt in the hospital, however does not have much energy. Denies SOB, orthopnea, PND or CP. Taking medications as prescribed. Weight at home 149 lbs. No cocaine. Have only smoked 1 cigarette for being home. Following a low salt diet and drinking less than 2L a day. Able to walk about 15-20 min a day with no issues. No dizziness.   RHC  09/21/14  RA = 2 RV = 19/2/3 PA = 22/14 (18) PCW = 6 Fick cardiac output/index = 5.2/2.7 PVR = 2.3 WU FA sat = 93% PA sat = 60%, 64%   ECHO EF 20-25%. RV ok. (05/2014) ECHO EF 20%, diff HK, mod MR, RV sys fx moderately reduced (09/2014)  SH:  Unemployed . Lives with his brother. He is able to drive to appointments.  FH: Dad and Mother CAD   ROS: All systems negative except as listed in HPI, PMH and Problem List.  SH:  History   Social History  . Marital Status: Single    Spouse Name: N/A    Number of Children: 1  . Years of Education: N/A   Occupational History  . Disabled    Social History Main Topics  . Smoking status: Current Some Day Smoker -- 0.10 packs/day for 31 years    Types: Cigarettes  . Smokeless tobacco: Never Used  . Alcohol Use: 0.0 oz/week    0 Not specified per week     Comment: 09/21/2014 "might have a drink q 6 /months, if that"  . Drug Use: Yes    Special: Cocaine     Comment: 09/21/2014 "last cocaine ~ 1 1/2 months ago"  . Sexual Activity: Yes   Other Topics Concern  . Not on file   Social History Narrative   Lives in [place] with his parents.     FH:   Family History  Problem Relation Age of Onset  . Diabetes Mother   . Heart disease Mother   . Diabetes Father   . Prostate cancer Father   . Heart disease Father   . Diabetes Brother   . Diabetes Brother     Past Medical History  Diagnosis Date  . Chronic systolic heart failure     a) Mixed ICM/NICM b) RHC (05/2014): RA 2, RV 19/2/3, PA 22/14 (18), PCWP 6, Fick CO/CI: 5.2 / 2.7, PVR 2.3 WU, PA 60% and 64% c) ECHO (05/2014): EF 20-25%, diff HK, akinesis entireanteroseptal myocardium, triv AI, mod MR, LA mod/sev dilated  . Drug abuse and dependence   . Automatic implantable cardioverter-defibrillator in situ   . Heart murmur   . Sleep apnea     "cleared after T&A"  . HTN (hypertension) 03/04/2013  . Left ventricular noncompaction   . Ischemic cardiomyopathy     a) Coronary angiography (12/2008) at [facility]: Lmain: nl, LAD mid 100% stenosis with left to left and right-to-left collaterals to the distal LAD; Lcx: nl, RCA nl.    . Type II diabetes mellitus   . High cholesterol   . Pneumonia 1988    Current Outpatient Prescriptions  Medication Sig  Dispense Refill  . acetaminophen (TYLENOL) 500 MG tablet Take 1,000 mg by mouth every 6 (six) hours as needed for moderate pain.    Marland Kitchen atorvastatin (LIPITOR) 40 MG tablet Take 1 tablet (40 mg total) by mouth daily at 6 PM. 30 tablet 6  . carvedilol (COREG) 3.125 MG tablet Take 1 tablet (3.125 mg total) by mouth 2 (two) times daily with a meal. (Patient taking differently: Take 3.125 mg by mouth daily. ) 60 tablet 3  . furosemide (LASIX) 20 MG tablet Take 2 tablets (40 mg total) by mouth daily. Or as instructed by provider. 60 tablet 3  . insulin glargine (LANTUS) 100 UNIT/ML injection Inject 32 Units into the skin at bedtime.    Marland Kitchen losartan (COZAAR) 25 MG tablet Take 0.5 tablets (12.5 mg total) by mouth daily. 15 tablet 3  . QC PEN NEEDLES 31G X 6 MM MISC 1 each by Other route See admin instructions. Use daily with insulin.    . traMADol (ULTRAM)  50 MG tablet Take 1 tablet (50 mg total) by mouth every 12 (twelve) hours as needed for moderate pain. 14 tablet 0  . warfarin (COUMADIN) 2 MG tablet Take 1 tablet (2 mg total) by mouth daily. (Patient taking differently: Take 2 mg by mouth daily. Takes with 5 mg tablets for total of $Remove'7mg'IFqIZcc$  daily.) 30 tablet 0   No current facility-administered medications for this encounter.    Filed Vitals:   10/02/14 1003  BP: 98/52  Pulse: 102  Height: 6' (1.829 m)  Weight: 149 lb 8 oz (67.813 kg)  SpO2: 97%    PHYSICAL EXAM: General:  Well appearing, NAD HEENT: normal Neck: supple. JVP 7. Carotids 2+ bilaterally; no bruits. No lymphadenopathy or thryomegaly appreciated. Cor: PMI normal. Tachy rate & rhythm. No rubs or murmurs. +RV lift Lungs: clear Tenderness noted R ribst Abdomen: soft, nontender, nondistended. No hepatosplenomegaly. No bruits or masses. Good bowel sounds. Extremities: no cyanosis, clubbing, rash, edema Neuro: alert & orientedx3, cranial nerves grossly intact. Moves all 4 extremities w/o difficulty. Affect pleasant.   ASSESSMENT & PLAN:  1) Chronic systolic HF: Mixed ICM/NICM (suspect noncompaction) s/p ICD, EF 20%, mod MR, RV sys fx moderately reduced (09/2014). St Jude Device.  - NYHA II-III symptoms and volume status stable. Will continue lasix 40 mg daily and discussed the use of sliding scale diuretics. - Continue low dose coreg 3.125 mg BID, will not titrate with soft BP.  - He was supposed to be on losartan 12.5 mg daily, however is taking 25 mg daily with no dizziness. Will check BMET today and if renal function and KCL ok will continue 25 mg daily.  - Not currently on digoxin or spironolactone d/t non-compliance. If he shows up for next appointment would start digoxin 0.125 mg daily.  - Reinforced the need and importance of daily weights, a low sodium diet, and fluid restriction (less than 2 L a day). Instructed to call the HF clinic if weight increases more than 3 lbs  overnight or 5 lbs in a week.  2) CAD: Coronary angiography (12/2008): 12/2008): mid LAD totally occluded and collateralized. No s/s of ischemia. Continue statin and BB. No ASA he is on coumadin.  3) L ventricular non-compaction: Has been on coumadin for this. Used to get this followed at [facility]. Will transition to [facility] for INR management.  4) DM2: Uncontrolled. Hgb A1C 11%. CSW working with patient to establish with PCP. He no showed to last appointment we got him  and discussed he needs to follow up as scheduled.  5) Tobacco and drug abuse: Current smoker, however has cut back and congratulated patient on success and encouraged to quit completely. Reports he has not used any cocaine and praised him. Continue to abstain.   Patient has health insurance and CSW met with patient and he reported he gets $1,100 Social Security Disability and has Medicare. Encouraged to pay for medications himself since they are on 4$ medication list, but discussed he can still use the OP pharmacy.   F/U 3 weeks Junie Bame B NP-C

## 2014-10-02 NOTE — Progress Notes (Signed)
CSW referred to assist patient with obtaining a PCP. CSW met with patient in the clinic and states he previously had a PCP although due to distance would like to be seen locally. CSW contacted Marinell Blight and scheduled new patient appointment for 2/26 @ 10:30am. Patient provided information and address and denies any further needs at this time. CSW continues to be available as needed. Raquel Sarna, [place]

## 2014-10-02 NOTE — Patient Instructions (Addendum)
Doing great.  Have a wonderful Christmas and [place].  Call any issues.  Follow up in 3 weeks.  Do the following things EVERYDAY: 1) Weigh yourself in the morning before breakfast. Write it down and keep it in a log. 2) Take your medicines as prescribed 3) Eat low salt foods-Limit salt (sodium) to 2000 mg per day.  4) Stay as active as you can everyday 5) Limit all fluids for the day to less than 2 liters

## 2014-10-23 ENCOUNTER — Encounter (HOSPITAL_COMMUNITY): Payer: Medicare Other

## 2014-10-23 ENCOUNTER — Inpatient Hospital Stay (HOSPITAL_COMMUNITY): Admission: RE | Admit: 2014-10-23 | Payer: Medicare Other | Source: Ambulatory Visit

## 2014-11-15 NOTE — Telephone Encounter (Signed)
Addressed during office visit on 10/02/14

## 2014-11-16 ENCOUNTER — Encounter (HOSPITAL_COMMUNITY): Payer: Self-pay

## 2014-11-16 ENCOUNTER — Ambulatory Visit (HOSPITAL_COMMUNITY)
Admission: RE | Admit: 2014-11-16 | Discharge: 2014-11-16 | Disposition: A | Discharge status: Home / Self Care | Payer: Medicare Other | Source: Ambulatory Visit | Attending: Internal Medicine | Admitting: Internal Medicine

## 2014-11-16 ENCOUNTER — Telehealth (HOSPITAL_COMMUNITY): Payer: Self-pay

## 2014-11-16 VITALS — BP 88/60 | HR 102 | Wt 153.0 lb

## 2014-11-16 DIAGNOSIS — Z79899 Other long term (current) drug therapy: Secondary | ICD-10-CM | POA: Diagnosis not present

## 2014-11-16 DIAGNOSIS — E1165 Type 2 diabetes mellitus with hyperglycemia: Secondary | ICD-10-CM | POA: Diagnosis not present

## 2014-11-16 DIAGNOSIS — Z720 Tobacco use: Secondary | ICD-10-CM

## 2014-11-16 DIAGNOSIS — I10 Essential (primary) hypertension: Secondary | ICD-10-CM | POA: Insufficient documentation

## 2014-11-16 DIAGNOSIS — E780 Pure hypercholesterolemia: Secondary | ICD-10-CM | POA: Insufficient documentation

## 2014-11-16 DIAGNOSIS — F17210 Nicotine dependence, cigarettes, uncomplicated: Secondary | ICD-10-CM | POA: Insufficient documentation

## 2014-11-16 DIAGNOSIS — I5022 Chronic systolic (congestive) heart failure: Secondary | ICD-10-CM | POA: Insufficient documentation

## 2014-11-16 DIAGNOSIS — F17200 Nicotine dependence, unspecified, uncomplicated: Secondary | ICD-10-CM

## 2014-11-16 DIAGNOSIS — Z794 Long term (current) use of insulin: Secondary | ICD-10-CM | POA: Diagnosis not present

## 2014-11-16 DIAGNOSIS — I2510 Atherosclerotic heart disease of native coronary artery without angina pectoris: Secondary | ICD-10-CM | POA: Diagnosis not present

## 2014-11-16 DIAGNOSIS — Z7901 Long term (current) use of anticoagulants: Secondary | ICD-10-CM | POA: Insufficient documentation

## 2014-11-16 DIAGNOSIS — I255 Ischemic cardiomyopathy: Secondary | ICD-10-CM | POA: Insufficient documentation

## 2014-11-16 DIAGNOSIS — I5023 Acute on chronic systolic (congestive) heart failure: Secondary | ICD-10-CM

## 2014-11-16 DIAGNOSIS — Z95810 Presence of automatic (implantable) cardiac defibrillator: Secondary | ICD-10-CM | POA: Insufficient documentation

## 2014-11-16 HISTORY — DX: Atherosclerotic heart disease of native coronary artery without angina pectoris: I25.10

## 2014-11-16 LAB — LIPID PANEL
Cholesterol: 150 mg/dL (ref 0–200)
HDL: 65 mg/dL (ref >39)
LDL Cholesterol: 48 mg/dL (ref 0–99)
Total CHOL/HDL Ratio: 2.3 RATIO
Triglycerides: 186 mg/dL — ABNORMAL HIGH (ref <150)
VLDL: 37 mg/dL (ref 0–40)

## 2014-11-16 LAB — PROTIME-INR
INR: 1.23 (ref 0.00–1.49)
Prothrombin Time: 15.6 seconds — ABNORMAL HIGH (ref 11.6–15.2)

## 2014-11-16 LAB — BASIC METABOLIC PANEL
Anion gap: 7 (ref 5–15)
BUN: 14 mg/dL (ref 6–23)
CO2: 26 mmol/L (ref 19–32)
Calcium: 8.9 mg/dL (ref 8.4–10.5)
Chloride: 98 mmol/L (ref 96–112)
Creatinine, Ser: 1.08 mg/dL (ref 0.50–1.35)
GFR calc Af Amer: 89 mL/min — ABNORMAL LOW (ref >90)
GFR calc non Af Amer: 77 mL/min — ABNORMAL LOW (ref >90)
Glucose, Bld: 555 mg/dL (ref 70–99)
Potassium: 4.4 mmol/L (ref 3.5–5.1)
Sodium: 131 mmol/L — ABNORMAL LOW (ref 135–145)

## 2014-11-16 MED ORDER — DIGOXIN 125 MCG PO TABS: 0.1250 mg | ORAL_TABLET | Freq: Every day | ORAL | Status: DC

## 2014-11-16 MED ORDER — CARVEDILOL 3.125 MG PO TABS: 3.1250 mg | ORAL_TABLET | Freq: Two times a day (BID) | ORAL | Status: DC

## 2014-11-16 NOTE — Patient Instructions (Signed)
STOP Losartan CHANGE Carvedilol 3.125 mg to twice a day START Digoxin 0.125 mg daily  Labs today  You have been referred to [facility] for Electrophysiology  And Coumadin Clinic     [place]  Your physician recommends that you schedule a follow-up appointment in: 2 weeks  Do the following things EVERYDAY: 1) Weigh yourself in the morning before breakfast. Write it down and keep it in a log. 2) Take your medicines as prescribed 3) Eat low salt foods-Limit salt (sodium) to 2000 mg per day.  4) Stay as active as you can everyday 5) Limit all fluids for the day to less than 2 liters 6)

## 2014-11-16 NOTE — Progress Notes (Signed)
Patient ID: Dennis [name], male   DOB: 1961/05/11, 54 y.o.   MRN: [id number]  PCP: Clover Mealy  HPI: 54 y/o male with h/o DM2, polysubstance use (tobacco, cocaine), mixed ICM/NICM and chronic systolic HF due to ventricular noncompaction and possible ischemic heart disease (reportedly had a collateralized LAD on last cath).   Patient reports increased fatigue recently.  BP is low today, 88/60.  He denies lightheadedness.  He is short of breath after walking about 50 yards or going up steps.  He does have orthopnea but not every night.  No chest pain.  He is smoking about 5-6 cigs/day. Diabetes is poorly controlled. He has been out of losartan for about 3 days.   [place]  09/21/14  RA = 2 RV = 19/2/3 PA = 22/14 (18) PCW = 6 Fick cardiac output/index = 5.2/2.7 PVR = 2.3 WU FA sat = 93% PA sat = 60%, 64%  ECHO EF 20-25%. RV ok. (05/2014) ECHO EF 20%, diff HK, mod MR, RV sys fx moderately reduced (09/2014)  Labs (12/15): K 4.8, creatinine 0.99, pro-BNP 1085  SH:  Unemployed. Lives with his brother. He is able to drive to appointments. Active smoker.   FH: Dad and Mother CAD  ROS: All systems negative except as listed in HPI, PMH and Problem List.  SH:  History   Social History  . Marital Status: Single    Spouse Name: N/A    Number of Children: 1  . Years of Education: N/A   Occupational History  . Disabled    Social History Main Topics  . Smoking status: Current Some Day Smoker -- 0.10 packs/day for 31 years    Types: Cigarettes  . Smokeless tobacco: Never Used  . Alcohol Use: 0.0 oz/week    0 Not specified per week     Comment: 09/21/2014 "might have a drink q 6 /months, if that"  . Drug Use: Yes    Special: Cocaine     Comment: 09/21/2014 "last cocaine ~ 1 1/2 months ago"  . Sexual Activity: Yes   Other Topics Concern  . Not on file   Social History Narrative   Lives in [place] with his parents.     FH:  Family History  Problem Relation Age of Onset  .  Diabetes Mother   . Heart disease Mother   . Diabetes Father   . Prostate cancer Father   . Heart disease Father   . Diabetes Brother   . Diabetes Brother     Past Medical History  Diagnosis Date  . Chronic systolic heart failure     a) Mixed ICM/NICM b) RHC (05/2014): RA 2, RV 19/2/3, PA 22/14 (18), PCWP 6, Fick CO/CI: 5.2 / 2.7, PVR 2.3 WU, PA 60% and 64% c) ECHO (05/2014): EF 20-25%, diff HK, akinesis entireanteroseptal myocardium, triv AI, mod MR, LA mod/sev dilated  . Drug abuse and dependence   . Automatic implantable cardioverter-defibrillator in situ   . Heart murmur   . Sleep apnea     "cleared after T&A"  . HTN (hypertension) 03/04/2013  . Left ventricular noncompaction   . Ischemic cardiomyopathy     a) Coronary angiography (12/2008) at [facility]: Lmain: nl, LAD mid 100% stenosis with left to left and right-to-left collaterals to the distal LAD; Lcx: nl, RCA nl.    . Type II diabetes mellitus   . High cholesterol   . Pneumonia 1988    Current Outpatient Prescriptions  Medication Sig Dispense Refill  . acetaminophen (  TYLENOL) 500 MG tablet Take 1,000 mg by mouth every 6 (six) hours as needed for moderate pain.    Marland Kitchen atorvastatin (LIPITOR) 40 MG tablet Take 1 tablet (40 mg total) by mouth daily at 6 PM. 30 tablet 6  . carvedilol (COREG) 3.125 MG tablet Take 1 tablet (3.125 mg total) by mouth 2 (two) times daily with a meal. 60 tablet 3  . furosemide (LASIX) 20 MG tablet Take 2 tablets (40 mg total) by mouth daily. Or as instructed by provider. 60 tablet 3  . insulin glargine (LANTUS) 100 UNIT/ML injection Inject 32 Units into the skin at bedtime.    . QC PEN NEEDLES 31G X 6 MM MISC 1 each by Other route See admin instructions. Use daily with insulin.    Marland Kitchen warfarin (COUMADIN) 2 MG tablet Take 1 tablet (2 mg total) by mouth daily. (Patient taking differently: Take 2 mg by mouth daily. Takes with 5 mg tablets for total of 7mg  daily.) 30 tablet 0  . digoxin (LANOXIN) 0.125 MG tablet  Take 1 tablet (0.125 mg total) by mouth daily. 30 tablet 3  . traMADol (ULTRAM) 50 MG tablet Take 1 tablet (50 mg total) by mouth every 12 (twelve) hours as needed for moderate pain. (Patient not taking: Reported on 11/16/2014) 14 tablet 0   No current facility-administered medications for this encounter.    Filed Vitals:   11/16/14 0939  BP: 88/60  Pulse: 102  Weight: 153 lb (69.4 kg)  SpO2: 95%    PHYSICAL EXAM: General:  Well appearing, NAD HEENT: normal Neck: supple. JVP 7. Carotids 2+ bilaterally; no bruits. No lymphadenopathy or thryomegaly appreciated. Cor: PMI normal. Tachy rate & rhythm. No rubs or murmurs. +RV lift Lungs: clear Tenderness noted R ribst Abdomen: soft, nontender, nondistended. No hepatosplenomegaly. No bruits or masses. Good bowel sounds. Extremities: no cyanosis, clubbing, rash, edema Neuro: alert & orientedx3, cranial nerves grossly intact. Moves all 4 extremities w/o difficulty. Affect pleasant.   ASSESSMENT & PLAN:  1) Chronic systolic HF: Mixed ischemic and nonischemic (suspect noncompaction) s/p St Jude ICD. Narrow QRS, not CRT candidate. Last echo with EF 20%, mod MR, RV sys fx moderately reduced (09/2014).  NYHA class II-III symptoms but not significantly volume overloaded on exam.  Prominent fatigue, possibly due to low BP versus low cardiac output. - Would continue Lasix at 40 mg daily for now.  BMET today. - He needs to take Coreg twice daily rather than once daily. With low BP, will hold off on restarting losartan for now.   - Start digoxin 0.125 mg daily.  This may help with his fatigue.   - I will arrange for followup with device clinic (has not had ICD checked in a long time).  - Reinforced the need and importance of daily weights, a low sodium diet, and fluid restriction (less than 2 L a day). Instructed to call the HF clinic if weight increases more than 3 lbs overnight or 5 lbs in a week.  2) CAD: Coronary angiography (12/2008) with mid LAD  totally occluded and collateralized.  No chest pain. Continue statin and BB. No ASA he is on coumadin.  Check lipids today.  3) L ventricular non-compaction: Has been on coumadin for this. Has not been having his INR followed.  I will set up followup with the [facility] coumadin clinic and check INR today. 4) DM2: Uncontrolled. Needs PCP followup.  5) Tobacco and drug abuse: No longer uses cocaine.  I counseled him about  stopping smoking.   Loralie Champagne 11/16/2014

## 2014-11-16 NOTE — Telephone Encounter (Signed)
Left VM for patient to call us back ASAP regarding CRITICAL high glucose of 555 from today's visit.  Also advised through VM to check again and to go to ED if still high.  Will continue to try to reach patient.  Renee Pain

## 2014-11-20 ENCOUNTER — Telehealth (HOSPITAL_COMMUNITY): Payer: Self-pay | Admitting: Vascular Surgery

## 2014-11-20 ENCOUNTER — Telehealth: Payer: Self-pay

## 2014-11-20 MED ORDER — WARFARIN SODIUM 5 MG PO TABS: ORAL_TABLET | ORAL | Status: DC

## 2014-11-20 NOTE — Telephone Encounter (Signed)
Encounter open in error 

## 2014-11-20 NOTE — Telephone Encounter (Signed)
Called spoke with pt, past due for Coumadin follow-up.  Pt states his Coumadin has been being monitored by [facility], has a new pt appt with [place] to est care on 12/01/14 per Dr Aundra Dubin.  Dr Aundra Dubin checked pt's INR on 11/16/14 at [place], INR low at 1.23 denies missing dosages, but states he is currently taking 3.5 of the 2mg  tablets daily for a total of 7mg  daily, but previously was on 7.5mg  daily.  Advised pt I will send in rx for 5mg  tablets which he has had previously and have him take 1.5 tablets daily until recheck on 12/01/14.  Pt states he has been out of Coumadin since Saturday, rx sent to pharmacy as requested, pt aware.  Advised pt he must keep appt in our clinic for refills.  Pt's glucose on 11/16/14 labwork was critically high at 555.  Pt has new pt appt with Dr Doug Sou at [place] office on 12/08/14 at 10:30.  Called their office and advised pt needed sooner appt given critically high glucose per Dr Aundra Dubin, they rescheduled appt for pt for 11/24/14 at [place] and advised pt of sooner appt.  Advised pt to go to ED with any problems prior to appt.  Pt verbalized understanding.

## 2014-11-22 ENCOUNTER — Telehealth (HOSPITAL_COMMUNITY): Payer: Self-pay | Admitting: *Deleted

## 2014-11-22 NOTE — Telephone Encounter (Signed)
Reviewed pt office note at CHF clinic. Pt had blood work completed that showed blood glucose level 550.  Pt noted in epic to have follow up appt on 2/13 to establish care with primary MD and for evaluation of diabetes.  Message left that pt will need to hold off on attending cardiac rehab until blood glucose levels are stable.  Per rehab policy blood glucose levels prior to exercise will need to be less than 300.  Contact information provided.Cherre Huger, BSN

## 2014-11-23 ENCOUNTER — Ambulatory Visit (HOSPITAL_COMMUNITY): Payer: Self-pay

## 2014-11-24 ENCOUNTER — Ambulatory Visit: Payer: Medicare Other | Admitting: Internal Medicine

## 2014-11-27 ENCOUNTER — Ambulatory Visit (HOSPITAL_COMMUNITY): Payer: Self-pay

## 2014-11-28 ENCOUNTER — Ambulatory Visit (INDEPENDENT_AMBULATORY_CARE_PROVIDER_SITE_OTHER): Payer: Medicare Other | Admitting: Internal Medicine

## 2014-11-28 ENCOUNTER — Encounter: Payer: Self-pay | Admitting: Internal Medicine

## 2014-11-28 VITALS — BP 82/72 | HR 90 | Ht 72.0 in | Wt 155.6 lb

## 2014-11-28 DIAGNOSIS — I255 Ischemic cardiomyopathy: Secondary | ICD-10-CM

## 2014-11-28 DIAGNOSIS — I5022 Chronic systolic (congestive) heart failure: Secondary | ICD-10-CM

## 2014-11-28 DIAGNOSIS — F17200 Nicotine dependence, unspecified, uncomplicated: Secondary | ICD-10-CM

## 2014-11-28 DIAGNOSIS — Z95810 Presence of automatic (implantable) cardiac defibrillator: Secondary | ICD-10-CM

## 2014-11-28 DIAGNOSIS — I10 Essential (primary) hypertension: Secondary | ICD-10-CM

## 2014-11-28 DIAGNOSIS — Z720 Tobacco use: Secondary | ICD-10-CM

## 2014-11-28 LAB — MDC_IDC_ENUM_SESS_TYPE_INCLINIC
Battery Remaining Longevity: 57.6 mo
Brady Statistic RA Percent Paced: 0 %
Brady Statistic RV Percent Paced: 0 %
Date Time Interrogation Session: 20160216172251
HighPow Impedance: 55.125
Implantable Pulse Generator Serial Number: 608100
Lead Channel Impedance Value: 375 Ohm
Lead Channel Impedance Value: 375 Ohm
Lead Channel Pacing Threshold Amplitude: 0.5 V
Lead Channel Pacing Threshold Amplitude: 0.5 V
Lead Channel Pacing Threshold Amplitude: 0.75 V
Lead Channel Pacing Threshold Amplitude: 0.75 V
Lead Channel Pacing Threshold Pulse Width: 0.5 ms
Lead Channel Pacing Threshold Pulse Width: 0.5 ms
Lead Channel Pacing Threshold Pulse Width: 0.5 ms
Lead Channel Pacing Threshold Pulse Width: 0.5 ms
Lead Channel Sensing Intrinsic Amplitude: 12 mV
Lead Channel Sensing Intrinsic Amplitude: 5 mV
Lead Channel Setting Pacing Amplitude: 2 V
Lead Channel Setting Pacing Amplitude: 2.5 V
Lead Channel Setting Pacing Pulse Width: 0.5 ms
Lead Channel Setting Sensing Sensitivity: 0.5 mV
Zone Setting Detection Interval: 280 ms

## 2014-11-28 NOTE — Assessment & Plan Note (Signed)
On medical therapy, his blood pressure has actually been on the low side. No change in medical therapy.

## 2014-11-28 NOTE — Patient Instructions (Signed)
Your physician wants you to follow-up in: 12 months with Dr Knox Saliva will receive a reminder letter in the mail two months in advance. If you don't receive a letter, please call our office to schedule the follow-up appointment.   Remote monitoring is used to monitor your Pacemaker of ICD from home. This monitoring reduces the number of office visits required to check your device to one time per year. It allows [place] to keep an eye on the functioning of your device to ensure it is working properly. You are scheduled for a device check from home on 02/27/15. You may send your transmission at any time that day. If you have a wireless device, the transmission will be sent automatically. After your physician reviews your transmission, you will receive a postcard with your next transmission date.

## 2014-11-28 NOTE — Assessment & Plan Note (Signed)
His chronic systolic heart failure remains class II. He will continue his current medications, and maintain a low-sodium diet.

## 2014-11-28 NOTE — Assessment & Plan Note (Signed)
His St. Jude single-chamber ICD is working normally. His fluid index is well compensated. He will continue his current medical therapy.

## 2014-11-28 NOTE — Assessment & Plan Note (Signed)
We discussed the importance of stopping smoking as well as avoiding substance abuse. He states that he will try to do better.

## 2014-11-28 NOTE — Progress Notes (Signed)
HPI Dennis [name] is referred today for ongoing evaluation and management of his ICD. He is a very pleasant 54 year old man with a mixed cardiomyopathy, chronic systolic heart failure, ejection fraction 20%. Initially, he was diagnosed in 2008. He underwent ICD implantation in 2011. He has not had an ICD shock. He has had problems with fluid overload as well as hypotension. He has been monitoring his blood pressure carefully and watching his weight. He has tried to avoid salt in his diet. He has a history of polysubstance abuse. This is been well documented in the past. He is trying to stop smoking. He has had no ICD shock. He denies syncope. He does get dizzy when he stands up quickly. No Known Allergies   Current Outpatient Prescriptions  Medication Sig Dispense Refill  . acetaminophen (TYLENOL) 500 MG tablet Take 1,000 mg by mouth every 6 (six) hours as needed for moderate pain.    Marland Kitchen atorvastatin (LIPITOR) 40 MG tablet Take 1 tablet (40 mg total) by mouth daily at 6 PM. 30 tablet 6  . carvedilol (COREG) 3.125 MG tablet Take 1 tablet (3.125 mg total) by mouth 2 (two) times daily with a meal. 60 tablet 3  . digoxin (LANOXIN) 0.125 MG tablet Take 1 tablet (0.125 mg total) by mouth daily. 30 tablet 3  . furosemide (LASIX) 20 MG tablet Take 20 mg by mouth daily.    . insulin glargine (LANTUS) 100 UNIT/ML injection Inject 32 Units into the skin at bedtime.    . QC PEN NEEDLES 31G X 6 MM MISC 1 each by Other route See admin instructions. Use daily with insulin.    . traMADol (ULTRAM) 50 MG tablet Take 1 tablet (50 mg total) by mouth every 12 (twelve) hours as needed for moderate pain. 14 tablet 0  . warfarin (COUMADIN) 5 MG tablet Take as directed by Coumadin clinic 50 tablet 0   No current facility-administered medications for this visit.     Past Medical History  Diagnosis Date  . Chronic systolic heart failure     a) Mixed ICM/NICM b) RHC (05/2014): RA 2, RV 19/2/3, PA 22/14 (18), PCWP 6,  Fick CO/CI: 5.2 / 2.7, PVR 2.3 WU, PA 60% and 64% c) ECHO (05/2014): EF 20-25%, diff HK, akinesis entireanteroseptal myocardium, triv AI, mod MR, LA mod/sev dilated  . Drug abuse and dependence   . Automatic implantable cardioverter-defibrillator in situ   . Heart murmur   . Sleep apnea     "cleared after T&A"  . HTN (hypertension) 03/04/2013  . Left ventricular noncompaction   . Ischemic cardiomyopathy     a) Coronary angiography (12/2008) at [facility]: Lmain: nl, LAD mid 100% stenosis with left to left and right-to-left collaterals to the distal LAD; Lcx: nl, RCA nl.    . Type II diabetes mellitus   . High cholesterol   . Pneumonia 1988    ROS:   All systems reviewed and negative except as noted in the HPI.   Past Surgical History  Procedure Laterality Date  . Forearm fracture surgery Left 1980  . Cardiac defibrillator placement  03/2011  . Cholecystectomy  1994  . Right heart catheterization N/A 05/30/2014    Procedure: RIGHT HEART CATH;  Surgeon: Jolaine Artist, MD;  Location: [facility] CATH LAB;  Service: Cardiovascular;  Laterality: N/A;  . Fracture surgery    . Cardiac catheterization  05/2014  . Tonsillectomy and adenoidectomy  ~ 1998     Family History  Problem Relation Age of Onset  . Diabetes Mother   . Heart disease Mother   . Diabetes Father   . Prostate cancer Father   . Heart disease Father   . Diabetes Brother   . Diabetes Brother      History   Social History  . Marital Status: Single    Spouse Name: N/A  . Number of Children: 1  . Years of Education: N/A   Occupational History  . Disabled    Social History Main Topics  . Smoking status: Current Some Day Smoker -- 0.10 packs/day for 31 years    Types: Cigarettes  . Smokeless tobacco: Never Used  . Alcohol Use: 0.0 oz/week    0 Standard drinks or equivalent per week     Comment: 09/21/2014 "might have a drink q 6 /months, if that"  . Drug Use: Yes    Special: Cocaine     Comment: 09/21/2014 "last  cocaine ~ 1 1/2 months ago"  . Sexual Activity: Yes   Other Topics Concern  . Not on file   Social History Narrative   Lives in [place] with his parents.      BP 82/72 mmHg  Pulse 90  Ht 6' (1.829 m)  Wt 155 lb 9.6 oz (70.58 kg)  BMI 21.10 kg/m2  Physical Exam:  Well appearing middle-aged man, NAD HEENT: Unremarkable Neck:  7 cm JVD, no thyromegally Back:  No CVA tenderness Lungs:  Clear, with no wheezes, rales, or rhonchi. HEART:  Regular rate and rhythm, no murmurs, no rubs, no clicks, a prominent S3 gallop is present. The PMI is enlarged and laterally displaced. Abd:  soft, positive bowel sounds, no organomegally, no rebound, no guarding Ext:  2 plus pulses, no edema, no cyanosis, no clubbing Skin:  No rashes no nodules Neuro:  CN II through XII intact, motor grossly intact  EKG - normal sinus rhythm  DEVICE  Normal device function.  See PaceArt for details.   Assess/Plan:

## 2014-11-29 ENCOUNTER — Ambulatory Visit (HOSPITAL_COMMUNITY): Payer: Self-pay

## 2014-11-30 ENCOUNTER — Telehealth (HOSPITAL_COMMUNITY): Payer: Self-pay | Admitting: *Deleted

## 2014-11-30 ENCOUNTER — Encounter: Payer: Self-pay | Admitting: Family

## 2014-11-30 ENCOUNTER — Ambulatory Visit (INDEPENDENT_AMBULATORY_CARE_PROVIDER_SITE_OTHER): Payer: Medicare Other | Admitting: Family

## 2014-11-30 VITALS — BP 98/68 | HR 109 | Temp 97.9°F | Resp 18 | Ht 72.0 in | Wt 160.0 lb

## 2014-11-30 DIAGNOSIS — E11319 Type 2 diabetes mellitus with unspecified diabetic retinopathy without macular edema: Secondary | ICD-10-CM

## 2014-11-30 DIAGNOSIS — E1169 Type 2 diabetes mellitus with other specified complication: Secondary | ICD-10-CM | POA: Insufficient documentation

## 2014-11-30 DIAGNOSIS — E785 Hyperlipidemia, unspecified: Secondary | ICD-10-CM

## 2014-11-30 DIAGNOSIS — I5023 Acute on chronic systolic (congestive) heart failure: Secondary | ICD-10-CM

## 2014-11-30 MED ORDER — METFORMIN HCL 500 MG PO TABS: 500.0000 mg | ORAL_TABLET | Freq: Two times a day (BID) | ORAL | Status: DC

## 2014-11-30 MED ORDER — INSULIN GLARGINE 100 UNIT/ML SOLOSTAR PEN: 32.0000 [IU] | PEN_INJECTOR | Freq: Every day | SUBCUTANEOUS | Status: DC

## 2014-11-30 MED ORDER — GLUCOSE BLOOD VI STRP: ORAL_STRIP | Status: DC

## 2014-11-30 NOTE — Assessment & Plan Note (Signed)
Patient's previous A1c was 10.5 indicating poor control his diabetes. He is currently maintained on 32 units of Lantus daily. No clear indication as to why his metformin was previously stopped. Restart metformin. Refer to ophthalmology for diabetic eye exam. We'll perform foot exam and urine microalbumin next visit. Continue current dosage of Lantus 32 units daily. Start metformin.

## 2014-11-30 NOTE — Progress Notes (Signed)
Pre visit review using our clinic review tool, if applicable. No additional management support is needed unless otherwise documented below in the visit note. 

## 2014-11-30 NOTE — Assessment & Plan Note (Signed)
Stable and maintained by the heart failure clinic. Continue current dosages of digoxin, furosemide, and carvedilol. Follow-up per heart failure clinic.

## 2014-11-30 NOTE — Assessment & Plan Note (Signed)
Stable with current dosage of Lipitor. Denies myalgia. Continue current dosage of Lipitor.

## 2014-11-30 NOTE — Progress Notes (Signed)
Subjective:    Patient ID: Dennis [name], male    DOB: [birth year], 54 y.o.   MRN: [id number]  Chief Complaint  Patient presents with  . Establish Care    would like to talk about his diabetes, says sugars have running in the high 200s    HPI:  Dennis [name] is a 54 y.o. male who presents today    1) Diabetes - Diabetes is currently uncontrolled with a random blood sugar of 555 during a previous office visit. Currently taking 32 units of Lantus. Last eye exam is overdue and patient notes the associated symptom of blurred vision.    Lab Results  Component Value Date   HGBA1C 10.5* 09/21/2014    Lab Results  Component Value Date   CREATININE 1.08 11/16/2014    2) Heart failure - Stable and being treated by the [facility]. Currently maintained on carvedilol., furosemide, and digoxin.   3) Hyperlipidemia - Stable and treated with Lipitor. Denies myalgia.  Lab Results  Component Value Date   CHOL 150 11/16/2014   HDL 65 11/16/2014   LDLCALC 48 11/16/2014   TRIG 186* 11/16/2014   CHOLHDL 2.3 11/16/2014    No Known Allergies   Current Outpatient Prescriptions on File Prior to Visit  Medication Sig Dispense Refill  . acetaminophen (TYLENOL) 500 MG tablet Take 1,000 mg by mouth every 6 (six) hours as needed for moderate pain.    Marland Kitchen atorvastatin (LIPITOR) 40 MG tablet Take 1 tablet (40 mg total) by mouth daily at 6 PM. 30 tablet 6  . carvedilol (COREG) 3.125 MG tablet Take 1 tablet (3.125 mg total) by mouth 2 (two) times daily with a meal. 60 tablet 3  . digoxin (LANOXIN) 0.125 MG tablet Take 1 tablet (0.125 mg total) by mouth daily. 30 tablet 3  . furosemide (LASIX) 20 MG tablet Take 20 mg by mouth daily.    . QC PEN NEEDLES 31G X 6 MM MISC 1 each by Other route See admin instructions. Use daily with insulin.    . traMADol (ULTRAM) 50 MG tablet Take 1 tablet (50 mg total) by mouth every 12 (twelve) hours as needed for moderate pain. 14 tablet 0  . warfarin  (COUMADIN) 5 MG tablet Take as directed by Coumadin clinic 50 tablet 0   No current facility-administered medications on file prior to visit.    Past Medical History  Diagnosis Date  . Chronic systolic heart failure     a) Mixed ICM/NICM b) RHC (05/2014): RA 2, RV 19/2/3, PA 22/14 (18), PCWP 6, Fick CO/CI: 5.2 / 2.7, PVR 2.3 WU, PA 60% and 64% c) ECHO (05/2014): EF 20-25%, diff HK, akinesis entireanteroseptal myocardium, triv AI, mod MR, LA mod/sev dilated  . Automatic implantable cardioverter-defibrillator in situ   . Heart murmur   . Sleep apnea     "cleared after T&A"  . Left ventricular noncompaction   . Ischemic cardiomyopathy     a) Coronary angiography (12/2008) at [facility]: Lmain: nl, LAD mid 100% stenosis with left to left and right-to-left collaterals to the distal LAD; Lcx: nl, RCA nl.    . Type II diabetes mellitus   . High cholesterol   . Pneumonia 1988  . Drug abuse and dependence     Past Surgical History  Procedure Laterality Date  . Forearm fracture surgery Left 1980  . Cardiac defibrillator placement  03/2011  . Cholecystectomy  1994  . Right heart catheterization N/A 05/30/2014  Procedure: RIGHT HEART CATH;  Surgeon: Jolaine Artist, MD;  Location: [facility] CATH LAB;  Service: Cardiovascular;  Laterality: N/A;  . Fracture surgery    . Cardiac catheterization  05/2014  . Tonsillectomy and adenoidectomy  ~ 1998    Family History  Problem Relation Age of Onset  . Diabetes Mother   . Heart disease Mother   . Diabetes Father   . Prostate cancer Father   . Heart disease Father   . Diabetes Brother   . Diabetes Brother     History   Social History  . Marital Status: Single    Spouse Name: N/A  . Number of Children: 1  . Years of Education: 14   Occupational History  . Disabled    Social History Main Topics  . Smoking status: Current Some Day Smoker -- 0.10 packs/day for 31 years    Types: Cigarettes  . Smokeless tobacco: Never Used  . Alcohol Use: 0.0  oz/week    0 Standard drinks or equivalent per week     Comment: 09/21/2014 "might have a drink q 6 /months, if that"  . Drug Use: No     Comment: 09/21/2014 "last cocaine ~ 1 1/2 months ago"  . Sexual Activity: Yes   Other Topics Concern  . Not on file   Social History Narrative   Lives in [place] with his mother. No pets. Fun: movies, sports, daughter.    Denies religious beliefs that would effect health care.     Review of Systems  Constitutional: Negative for unexpected weight change.  Eyes:       Positive for blurred vision  Respiratory: Negative for cough, chest tightness and shortness of breath.   Cardiovascular: Negative for chest pain, palpitations and leg swelling.  Musculoskeletal: Negative for myalgias.      Objective:    BP 98/68 mmHg  Pulse 109  Temp(Src) 97.9 F (36.6 C) (Oral)  Resp 18  Ht 6' (1.829 m)  Wt 160 lb (72.576 kg)  BMI 21.70 kg/m2  SpO2 99% Nursing note and vital signs reviewed.  Physical Exam  Constitutional: He is oriented to person, place, and time. He appears well-developed and well-nourished. No distress.  Cardiovascular: Normal rate, regular rhythm, normal heart sounds and intact distal pulses.   Pulmonary/Chest: Effort normal and breath sounds normal.  Neurological: He is alert and oriented to person, place, and time.  Skin: Skin is warm and dry.  Psychiatric: He has a normal mood and affect. His behavior is normal. Judgment and thought content normal.       Assessment & Plan:

## 2014-11-30 NOTE — Patient Instructions (Addendum)
Please take your blood sugars in the morning. Please call in your blood sugars on Monday and Friday next week.   Thank you for choosing [facility].  Summary/Instructions:  Your prescription(s) have been submitted to your pharmacy or been printed and provided for you. Please take as directed and contact our office if you believe you are having problem(s) with the medication(s) or have any questions.  Referrals have been made during this visit. You should expect to hear back from our schedulers in about 7-10 days in regards to establishing an appointment with the specialists we discussed.   If your symptoms worsen or fail to improve, please contact our office for further instruction, or in case of emergency go directly to the emergency room at the closest medical facility.    Type 2 Diabetes Mellitus Type 2 diabetes mellitus, often simply referred to as type 2 diabetes, is a long-lasting (chronic) disease. In type 2 diabetes, the pancreas does not make enough insulin (a hormone), the cells are less responsive to the insulin that is made (insulin resistance), or both. Normally, insulin moves sugars from food into the tissue cells. The tissue cells use the sugars for energy. The lack of insulin or the lack of normal response to insulin causes excess sugars to build up in the blood instead of going into the tissue cells. As a result, high blood sugar (hyperglycemia) develops. The effect of high sugar (glucose) levels can cause many complications. Type 2 diabetes was also previously called adult-onset diabetes, but it can occur at any age.  RISK FACTORS  A person is predisposed to developing type 2 diabetes if someone in the family has the disease and also has one or more of the following primary risk factors:  Overweight.  An inactive lifestyle.  A history of consistently eating high-calorie foods. Maintaining a normal weight and regular physical activity can reduce the chance of  developing type 2 diabetes. SYMPTOMS  A person with type 2 diabetes may not show symptoms initially. The symptoms of type 2 diabetes appear slowly. The symptoms include:  Increased thirst (polydipsia).  Increased urination (polyuria).  Increased urination during the night (nocturia).  Weight loss. This weight loss may be rapid.  Frequent, recurring infections.  Tiredness (fatigue).  Weakness.  Vision changes, such as blurred vision.  Fruity smell to your breath.  Abdominal pain.  Nausea or vomiting.  Cuts or bruises which are slow to heal.  Tingling or numbness in the hands or feet. DIAGNOSIS Type 2 diabetes is frequently not diagnosed until complications of diabetes are present. Type 2 diabetes is diagnosed when symptoms or complications are present and when blood glucose levels are increased. Your blood glucose level may be checked by one or more of the following blood tests:  A fasting blood glucose test. You will not be allowed to eat for at least 8 hours before a blood sample is taken.  A random blood glucose test. Your blood glucose is checked at any time of the day regardless of when you ate.  A hemoglobin A1c blood glucose test. A hemoglobin A1c test provides information about blood glucose control over the previous 3 months.  An oral glucose tolerance test (OGTT). Your blood glucose is measured after you have not eaten (fasted) for 2 hours and then after you drink a glucose-containing beverage. TREATMENT   You may need to take insulin or diabetes medicine daily to keep blood glucose levels in the desired range.  If you use insulin, you may  need to adjust the dosage depending on the carbohydrates that you eat with each meal or snack. The treatment goal is to maintain the before meal blood sugar (preprandial glucose) level at 70-130 mg/dL. HOME CARE INSTRUCTIONS   Have your hemoglobin A1c level checked twice a year.  Perform daily blood glucose monitoring as  directed by your health care provider.  Monitor urine ketones when you are ill and as directed by your health care provider.  Take your diabetes medicine or insulin as directed by your health care provider to maintain your blood glucose levels in the desired range.  Never run out of diabetes medicine or insulin. It is needed every day.  If you are using insulin, you may need to adjust the amount of insulin given based on your intake of carbohydrates. Carbohydrates can raise blood glucose levels but need to be included in your diet. Carbohydrates provide vitamins, minerals, and fiber which are an essential part of a healthy diet. Carbohydrates are found in fruits, vegetables, whole grains, dairy products, legumes, and foods containing added sugars.  Eat healthy foods. You should make an appointment to see a registered dietitian to help you create an eating plan that is right for you.  Lose weight if you are overweight.  Carry a medical alert card or wear your medical alert jewelry.  Carry a 15-gram carbohydrate snack with you at all times to treat low blood glucose (hypoglycemia). Some examples of 15-gram carbohydrate snacks include:  Glucose tablets, 3 or 4.  Glucose gel, 15-gram tube.  Raisins, 2 tablespoons (24 grams).  Jelly beans, 6.  Animal crackers, 8.  Regular pop, 4 ounces (120 mL).  Gummy treats, 9.  Recognize hypoglycemia. Hypoglycemia occurs with blood glucose levels of 70 mg/dL and below. The risk for hypoglycemia increases when fasting or skipping meals, during or after intense exercise, and during sleep. Hypoglycemia symptoms can include:  Tremors or shakes.  Decreased ability to concentrate.  Sweating.  Increased heart rate.  Headache.  Dry mouth.  Hunger.  Irritability.  Anxiety.  Restless sleep.  Altered speech or coordination.  Confusion.  Treat hypoglycemia promptly. If you are alert and able to safely swallow, follow the 15:15  rule:  Take 15-20 grams of rapid-acting glucose or carbohydrate. Rapid-acting options include glucose gel, glucose tablets, or 4 ounces (120 mL) of fruit juice, regular soda, or low-fat milk.  Check your blood glucose level 15 minutes after taking the glucose.  Take 15-20 grams more of glucose if the repeat blood glucose level is still 70 mg/dL or below.  Eat a meal or snack within 1 hour once blood glucose levels return to normal.  Be alert to feeling very thirsty and urinating more frequently than usual, which are early signs of hyperglycemia. An early awareness of hyperglycemia allows for prompt treatment. Treat hyperglycemia as directed by your health care provider.  Engage in at least 150 minutes of moderate-intensity physical activity a week, spread over at least 3 days of the week or as directed by your health care provider. In addition, you should engage in resistance exercise at least 2 times a week or as directed by your health care provider. Try to spend no more than 90 minutes at one time inactive.  Adjust your medicine and food intake as needed if you start a new exercise or sport.  Follow your sick-day plan anytime you are unable to eat or drink as usual.  Do not use any tobacco products including cigarettes, chewing tobacco, or electronic  cigarettes. If you need help quitting, ask your health care provider.  Limit alcohol intake to no more than 1 drink per day for nonpregnant women and 2 drinks per day for men. You should drink alcohol only when you are also eating food. Talk with your health care provider whether alcohol is safe for you. Tell your health care provider if you drink alcohol several times a week.  Keep all follow-up visits as directed by your health care provider. This is important.  Schedule an eye exam soon after the diagnosis of type 2 diabetes and then annually.  Perform daily skin and foot care. Examine your skin and feet daily for cuts, bruises,  redness, nail problems, bleeding, blisters, or sores. A foot exam by a health care provider should be done annually.  Brush your teeth and gums at least twice a day and floss at least once a day. Follow up with your dentist regularly.  Share your diabetes management plan with your workplace or school.  Stay up-to-date with immunizations. It is recommended that people with diabetes who are over 49 years old get the pneumonia vaccine. In some cases, two separate shots may be given. Ask your health care provider if your pneumonia vaccination is up-to-date.  Learn to manage stress.  Obtain ongoing diabetes education and support as needed.  Participate in or seek rehabilitation as needed to maintain or improve independence and quality of life. Request a physical or occupational therapy referral if you are having foot or hand numbness, or difficulties with grooming, dressing, eating, or physical activity. SEEK MEDICAL CARE IF:   You are unable to eat food or drink fluids for more than 6 hours.  You have nausea and vomiting for more than 6 hours.  Your blood glucose level is over 240 mg/dL.  There is a change in mental status.  You develop an additional serious illness.  You have diarrhea for more than 6 hours.  You have been sick or have had a fever for a couple of days and are not getting better.  You have pain during any physical activity.  SEEK IMMEDIATE MEDICAL CARE IF:  You have difficulty breathing.  You have moderate to large ketone levels. MAKE SURE YOU:  Understand these instructions.  Will watch your condition.  Will get help right away if you are not doing well or get worse. Document Released: 09/29/2005 Document Revised: 02/13/2014 Document Reviewed: 04/27/2012 [facility] Patient Information 2015 [place], [place]. This information is not intended to replace advice given to you by your health care provider. Make sure you discuss any questions you have with your health  care provider.

## 2014-11-30 NOTE — Telephone Encounter (Signed)
Called to assess the readiness to attend CR.  Waiting for the primary MD establishment for diabetes management Pt had recent lab work which showed glucose level of over 500.  Pt original orientation appointment placed on hold in order for pt to have improved control of his diabetes in order to proceed with CR.Pt was to also to complete follow up with the EP at [facility].  Pt seen by Dr. Lovena Le.  Pt unable to see primary MD due to inclement weather.  This was rescheduled for 3/28. Pt asked if rehab staff to call to see if he could be seen sooner than March.  Able to get pt an appointment on today at 3:30.  Pt agreeable to this. Cherre Huger, BSN

## 2014-12-01 ENCOUNTER — Encounter (HOSPITAL_COMMUNITY): Payer: Self-pay

## 2014-12-01 ENCOUNTER — Ambulatory Visit (HOSPITAL_COMMUNITY): Payer: Self-pay

## 2014-12-01 ENCOUNTER — Ambulatory Visit (HOSPITAL_COMMUNITY)
Admission: RE | Admit: 2014-12-01 | Discharge: 2014-12-01 | Disposition: A | Discharge status: Home / Self Care | Payer: Medicare Other | Source: Ambulatory Visit | Attending: Internal Medicine | Admitting: Internal Medicine

## 2014-12-01 ENCOUNTER — Ambulatory Visit (INDEPENDENT_AMBULATORY_CARE_PROVIDER_SITE_OTHER): Payer: Medicare Other

## 2014-12-01 ENCOUNTER — Telehealth: Payer: Self-pay | Admitting: Family

## 2014-12-01 ENCOUNTER — Telehealth (HOSPITAL_COMMUNITY): Payer: Self-pay | Admitting: *Deleted

## 2014-12-01 VITALS — BP 86/60 | Wt 157.4 lb

## 2014-12-01 DIAGNOSIS — I428 Other cardiomyopathies: Secondary | ICD-10-CM

## 2014-12-01 DIAGNOSIS — Z7901 Long term (current) use of anticoagulants: Secondary | ICD-10-CM

## 2014-12-01 DIAGNOSIS — F17200 Nicotine dependence, unspecified, uncomplicated: Secondary | ICD-10-CM

## 2014-12-01 DIAGNOSIS — Z720 Tobacco use: Secondary | ICD-10-CM

## 2014-12-01 DIAGNOSIS — I5022 Chronic systolic (congestive) heart failure: Secondary | ICD-10-CM | POA: Insufficient documentation

## 2014-12-01 LAB — BASIC METABOLIC PANEL
Anion gap: 6 (ref 5–15)
BUN: 11 mg/dL (ref 6–23)
CO2: 28 mmol/L (ref 19–32)
Calcium: 9.3 mg/dL (ref 8.4–10.5)
Chloride: 102 mmol/L (ref 96–112)
Creatinine, Ser: 1.02 mg/dL (ref 0.50–1.35)
GFR calc Af Amer: >90 mL/min (ref >90)
GFR calc non Af Amer: 83 mL/min — ABNORMAL LOW (ref >90)
Glucose, Bld: 305 mg/dL — ABNORMAL HIGH (ref 70–99)
Potassium: 4.9 mmol/L (ref 3.5–5.1)
Sodium: 136 mmol/L (ref 135–145)

## 2014-12-01 LAB — POCT INR: INR: 1.4

## 2014-12-01 LAB — DIGOXIN LEVEL: Digoxin Level: <0.2 ng/mL — ABNORMAL LOW (ref 0.8–2.0)

## 2014-12-01 MED ORDER — METFORMIN HCL 500 MG PO TABS: 500.0000 mg | ORAL_TABLET | Freq: Two times a day (BID) | ORAL | Status: DC

## 2014-12-01 MED ORDER — FUROSEMIDE 40 MG PO TABS: 40.0000 mg | ORAL_TABLET | ORAL | Status: DC

## 2014-12-01 NOTE — Progress Notes (Signed)
Patient ID: Dennis [name], male   DOB: 1961-01-18, 54 y.o.   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   He returns for follow up. Last visit digoxin was added and he was instructed to take carvedilol twice a day. Had INR checked earlier today and his INR was 1.4 because he had been out of coumadin for 4 days. Overall feels ok. Denies SOB/PND/Orthopnea/dizziness. Taking all medications. Weight at home 155-157 pounds. Smoking 5 cigarettes per day.   [place]  09/21/14  RA = 2 RV = 19/2/3 PA = 22/14 (18) PCW = 6 Fick cardiac output/index = 5.2/2.7 PVR = 2.3 WU FA sat = 93% PA sat = 60%, 64%  ECHO EF 20-25%. RV ok. (05/2014) ECHO EF 20%, diff HK, mod MR, RV sys fx moderately reduced (09/2014)  Labs (12/15): K 4.8, creatinine 0.99, pro-BNP 1085  SH:  Unemployed. Lives with his brother. He is able to drive to appointments. Active smoker.   FH: Dad and Mother CAD  ROS: All systems negative except as listed in HPI, PMH and Problem List.  SH:  History   Social History  . Marital Status: Single    Spouse Name: N/A  . Number of Children: 1  . Years of Education: 14   Occupational History  . Disabled    Social History Main Topics  . Smoking status: Current Some Day Smoker -- 0.10 packs/day for 31 years    Types: Cigarettes  . Smokeless tobacco: Never Used  . Alcohol Use: 0.0 oz/week    0 Standard drinks or equivalent per week     Comment: 09/21/2014 "might have a drink q 6 /months, if that"  . Drug Use: No     Comment: 09/21/2014 "last cocaine ~ 1 1/2 months ago"  . Sexual Activity: Yes   Other Topics Concern  . Not on file   Social History Narrative   Lives in [place] with his mother. No pets. Fun: movies, sports, daughter.    Denies religious beliefs that would effect health care.     FH:  Family History  Problem Relation Age of Onset  . Diabetes Mother   . Heart disease Mother   . Diabetes Father   . Prostate cancer Father   . Heart disease Father   . Diabetes Brother   . Diabetes Brother     Past Medical History  Diagnosis Date  . Chronic systolic heart failure     a) Mixed ICM/NICM b) RHC (05/2014): RA 2, RV 19/2/3, PA 22/14 (18), PCWP 6, Fick CO/CI: 5.2 / 2.7, PVR 2.3 WU, PA 60% and 64% c) ECHO (05/2014): EF 20-25%, diff HK, akinesis entireanteroseptal myocardium, triv AI, mod MR, LA mod/sev dilated  . Automatic implantable cardioverter-defibrillator in situ   . Heart murmur   . Sleep apnea     "cleared after T&A"  . Left ventricular noncompaction   . Ischemic cardiomyopathy     a) Coronary angiography (12/2008) at [facility]: Lmain: nl, LAD mid 100% stenosis with left to left and right-to-left collaterals to the distal LAD; Lcx: nl, RCA nl.    . Type II diabetes mellitus   . High cholesterol   . Pneumonia 1988  . Drug abuse and dependence     Current Outpatient  Prescriptions  Medication Sig Dispense Refill  . acetaminophen (TYLENOL) 500 MG tablet Take 1,000 mg by mouth every 6 (six) hours as needed for moderate pain.    Marland Kitchen atorvastatin (LIPITOR) 40 MG tablet Take 1 tablet (40 mg total) by mouth daily at 6 PM. 30 tablet 6  . carvedilol (COREG) 3.125 MG tablet Take 1 tablet (3.125 mg total) by mouth 2 (two) times daily with a meal. 60 tablet 3  . digoxin (LANOXIN) 0.125 MG tablet Take 1 tablet (0.125 mg total) by mouth daily. 30 tablet 3  . furosemide (LASIX) 20 MG tablet Take 20 mg by mouth daily.    Marland Kitchen glucose blood (ONETOUCH VERIO) test strip Use as instructed 100 each 12  . Insulin Glargine (LANTUS) 100 UNIT/ML Solostar Pen Inject 32 Units into the skin daily at 10 pm. 15 mL 11  . metFORMIN (GLUCOPHAGE) 500 MG tablet Take 1 tablet (500 mg total) by mouth 2 (two) times daily with a meal. 180 tablet 3   . QC PEN NEEDLES 31G X 6 MM MISC 1 each by Other route See admin instructions. Use daily with insulin.    . traMADol (ULTRAM) 50 MG tablet Take 1 tablet (50 mg total) by mouth every 12 (twelve) hours as needed for moderate pain. 14 tablet 0  . warfarin (COUMADIN) 5 MG tablet Take as directed by Coumadin clinic 50 tablet 0   No current facility-administered medications for this encounter.    Filed Vitals:   12/01/14 1034  BP: 86/60  Weight: 157 lb 6.4 oz (71.396 kg)    PHYSICAL EXAM: General:  Well appearing, NAD HEENT: normal Neck: supple. JVP flat. Carotids 2+ bilaterally; no bruits. No lymphadenopathy or thryomegaly appreciated. Cor: PMI normal. Tachy rate & rhythm. No rubs or murmurs. +RV lift Lungs: clear Tenderness noted R ribst Abdomen: soft, nontender, nondistended. No hepatosplenomegaly. No bruits or masses. Good bowel sounds. Extremities: no cyanosis, clubbing, rash, edema Neuro: alert & orientedx3, cranial nerves grossly intact. Moves all 4 extremities w/o difficulty. Affect pleasant.   ASSESSMENT & PLAN:  1) Chronic systolic HF: Mixed ischemic and nonischemic (suspect noncompaction) s/p St Jude ICD. Narrow QRS, not CRT candidate. Last echo with EF 20%, mod MR, RV sys fx moderately reduced (09/2014).  NYHA class II with functional improvement.  - Volume status appears low and BP soft. Today cut  back lasix to 40 mg every other day.    - Continue  Coreg 3.125 mg twice a day.  Off losartan due to hypotension.    - On digoxin 0.125 mg daily. Check dig level today.  - Had device checked earlier this week. Functioning appropriately.  - Reinforced the need and importance of daily weights, a low sodium diet, and fluid restriction (less than 2 L a day). Instructed to call the HF clinic if weight increases more than 3 lbs overnight or 5 lbs in a week.  2) CAD: Coronary angiography (12/2008) with mid LAD totally occluded and collateralized.  No chest pain. Continue statin and BB. No  ASA he is on coumadin.    3) L ventricular non-compaction: Has been on coumadin for this. Followed at [facility] coumadin clinic. 4) DM2: Uncontrolled. Now on metformin. Followed by PCP.   5) Tobacco and drug abuse: No longer uses cocaine.  Needs to stop smoking.   Check BMET and dig level today. Follow up in 3 week.s   CLEGG,AMY 12/01/2014

## 2014-12-01 NOTE — Telephone Encounter (Signed)
Pt called in, wanted to see if Marya Amsler had copay cards or could help with his metformin?  He said that it is $6.00 but he can not pay for it.

## 2014-12-01 NOTE — Telephone Encounter (Signed)
Called pt and explained that we did not have any discount cards for metformin but I could send it to a [facility] where they would have it on there $4 list. Pt agreed to have it sent to [place].

## 2014-12-01 NOTE — Telephone Encounter (Signed)
Pt returned call from message left on voicemail. Verified with pt that he was able to make appt with primary MD on yesterday.  Pt reports started on Metformin which he took before he was on insulin.   HGA1C show 10.2.  Pt presently on the bus going to [place] for two appointments. Pt has not picked up his new prescription yet.  Will plan for pt to hold orientation to cardiac rehab until 3/3.  This will give his new medication time to see if this is beneficial for pt. Explained the guidelines blood glucose must be in order to exercise. Pt verbalized understanding and is in agreement.

## 2014-12-01 NOTE — Patient Instructions (Signed)
CHANGE Lasix to 40mg  every other day.  Follow up 3 weeks.  Do the following things EVERYDAY: 1) Weigh yourself in the morning before breakfast. Write it down and keep it in a log. 2) Take your medicines as prescribed 3) Eat low salt foods-Limit salt (sodium) to 2000 mg per day.  4) Stay as active as you can everyday 5) Limit all fluids for the day to less than 2 liters

## 2014-12-01 NOTE — Telephone Encounter (Signed)
emmi mailed  °

## 2014-12-03 ENCOUNTER — Emergency Department (HOSPITAL_COMMUNITY): Payer: Medicare Other

## 2014-12-03 ENCOUNTER — Emergency Department (HOSPITAL_COMMUNITY)
Admission: EM | Admit: 2014-12-03 | Discharge: 2014-12-03 | Disposition: A | Discharge status: Home / Self Care | Payer: Medicare Other | Attending: Emergency Medicine | Admitting: Emergency Medicine

## 2014-12-03 ENCOUNTER — Encounter (HOSPITAL_COMMUNITY): Payer: Self-pay | Admitting: *Deleted

## 2014-12-03 DIAGNOSIS — R000 Tachycardia, unspecified: Secondary | ICD-10-CM | POA: Insufficient documentation

## 2014-12-03 DIAGNOSIS — Z8701 Personal history of pneumonia (recurrent): Secondary | ICD-10-CM | POA: Diagnosis not present

## 2014-12-03 DIAGNOSIS — R0602 Shortness of breath: Secondary | ICD-10-CM | POA: Diagnosis not present

## 2014-12-03 DIAGNOSIS — Z79899 Other long term (current) drug therapy: Secondary | ICD-10-CM | POA: Diagnosis not present

## 2014-12-03 DIAGNOSIS — Z8669 Personal history of other diseases of the nervous system and sense organs: Secondary | ICD-10-CM | POA: Insufficient documentation

## 2014-12-03 DIAGNOSIS — R011 Cardiac murmur, unspecified: Secondary | ICD-10-CM | POA: Diagnosis not present

## 2014-12-03 DIAGNOSIS — Z9889 Other specified postprocedural states: Secondary | ICD-10-CM | POA: Diagnosis not present

## 2014-12-03 DIAGNOSIS — E780 Pure hypercholesterolemia: Secondary | ICD-10-CM | POA: Insufficient documentation

## 2014-12-03 DIAGNOSIS — Z720 Tobacco use: Secondary | ICD-10-CM | POA: Diagnosis not present

## 2014-12-03 DIAGNOSIS — Z794 Long term (current) use of insulin: Secondary | ICD-10-CM | POA: Diagnosis not present

## 2014-12-03 DIAGNOSIS — Z7901 Long term (current) use of anticoagulants: Secondary | ICD-10-CM | POA: Diagnosis not present

## 2014-12-03 DIAGNOSIS — R0600 Dyspnea, unspecified: Secondary | ICD-10-CM

## 2014-12-03 DIAGNOSIS — E860 Dehydration: Secondary | ICD-10-CM | POA: Diagnosis not present

## 2014-12-03 DIAGNOSIS — E1165 Type 2 diabetes mellitus with hyperglycemia: Secondary | ICD-10-CM | POA: Diagnosis not present

## 2014-12-03 DIAGNOSIS — I5022 Chronic systolic (congestive) heart failure: Secondary | ICD-10-CM | POA: Diagnosis not present

## 2014-12-03 DIAGNOSIS — I9589 Other hypotension: Secondary | ICD-10-CM

## 2014-12-03 DIAGNOSIS — R42 Dizziness and giddiness: Secondary | ICD-10-CM | POA: Diagnosis not present

## 2014-12-03 DIAGNOSIS — R05 Cough: Secondary | ICD-10-CM | POA: Diagnosis present

## 2014-12-03 DIAGNOSIS — R739 Hyperglycemia, unspecified: Secondary | ICD-10-CM

## 2014-12-03 DIAGNOSIS — Z95810 Presence of automatic (implantable) cardiac defibrillator: Secondary | ICD-10-CM | POA: Diagnosis not present

## 2014-12-03 LAB — CBC
HCT: 39.1 % (ref 39.0–52.0)
Hemoglobin: 12.7 g/dL — ABNORMAL LOW (ref 13.0–17.0)
MCH: 30.3 pg (ref 26.0–34.0)
MCHC: 32.5 g/dL (ref 30.0–36.0)
MCV: 93.3 fL (ref 78.0–100.0)
Platelets: 222 10*3/uL (ref 150–400)
RBC: 4.19 MIL/uL — ABNORMAL LOW (ref 4.22–5.81)
RDW: 13.3 % (ref 11.5–15.5)
WBC: 10.1 10*3/uL (ref 4.0–10.5)

## 2014-12-03 LAB — COMPREHENSIVE METABOLIC PANEL
ALT: 18 U/L (ref 0–53)
AST: 30 U/L (ref 0–37)
Albumin: 3.3 g/dL — ABNORMAL LOW (ref 3.5–5.2)
Alkaline Phosphatase: 107 U/L (ref 39–117)
Anion gap: 6 (ref 5–15)
BUN: 18 mg/dL (ref 6–23)
CO2: 29 mmol/L (ref 19–32)
Calcium: 9.1 mg/dL (ref 8.4–10.5)
Chloride: 98 mmol/L (ref 96–112)
Creatinine, Ser: 1.15 mg/dL (ref 0.50–1.35)
GFR calc Af Amer: 83 mL/min — ABNORMAL LOW (ref >90)
GFR calc non Af Amer: 72 mL/min — ABNORMAL LOW (ref >90)
Glucose, Bld: 322 mg/dL — ABNORMAL HIGH (ref 70–99)
Potassium: 5.7 mmol/L — ABNORMAL HIGH (ref 3.5–5.1)
Sodium: 133 mmol/L — ABNORMAL LOW (ref 135–145)
Total Bilirubin: 1.3 mg/dL — ABNORMAL HIGH (ref 0.3–1.2)
Total Protein: 5.8 g/dL — ABNORMAL LOW (ref 6.0–8.3)

## 2014-12-03 LAB — POTASSIUM: Potassium: 4.2 mmol/L (ref 3.5–5.1)

## 2014-12-03 LAB — D-DIMER, QUANTITATIVE: D-Dimer, Quant: 0.4 ug/mL-FEU (ref 0.00–0.48)

## 2014-12-03 LAB — I-STAT TROPONIN, ED
Troponin i, poc: 0.01 ng/mL (ref 0.00–0.08)
Troponin i, poc: 0.02 ng/mL (ref 0.00–0.08)

## 2014-12-03 LAB — CBG MONITORING, ED: Glucose-Capillary: 202 mg/dL — ABNORMAL HIGH (ref 70–99)

## 2014-12-03 LAB — BRAIN NATRIURETIC PEPTIDE: B Natriuretic Peptide: 321.1 pg/mL — ABNORMAL HIGH (ref 0.0–100.0)

## 2014-12-03 IMAGING — CR DG CHEST 1V PORT
2 series · 2 of 2 positions shown · non-contrast
Comparison: [DATE]

CLINICAL DATA: Productive cough with dyspnea

EXAM:
PORTABLE CHEST - 1 VIEW

[AP (1 of 2)]
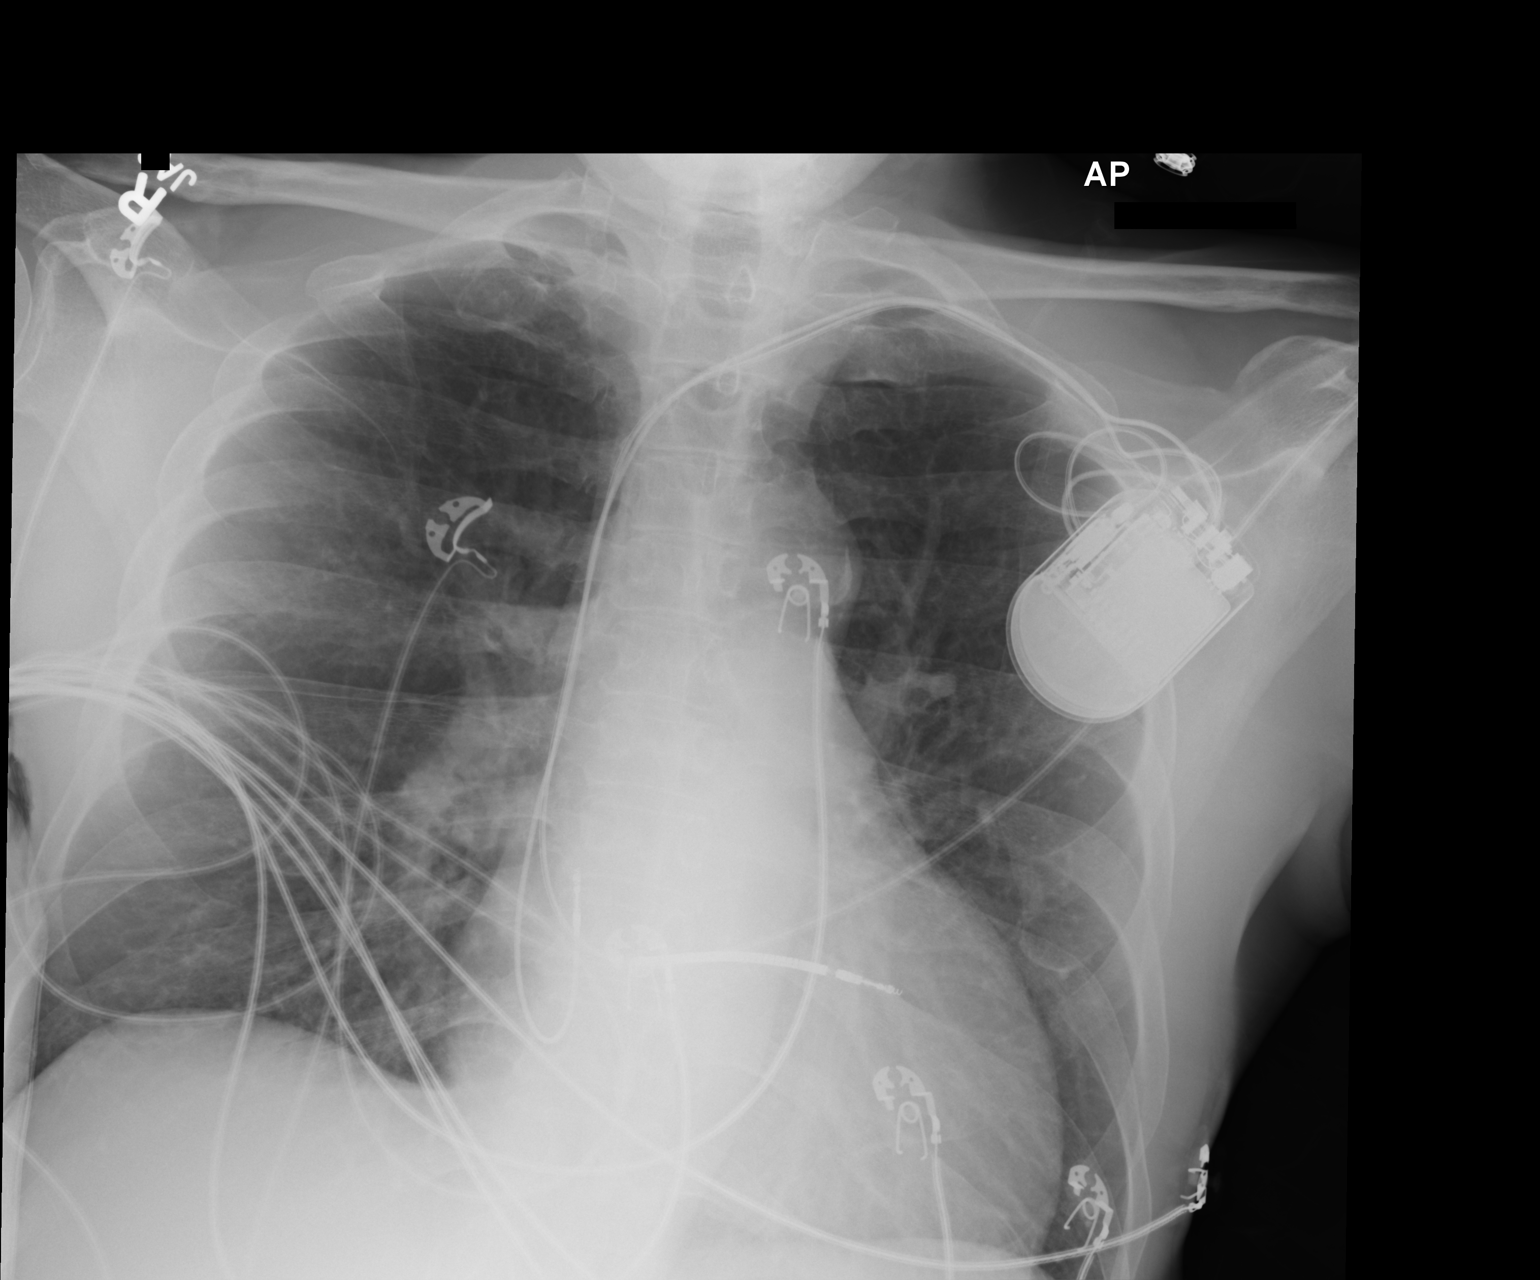

[AP (2 of 2)]
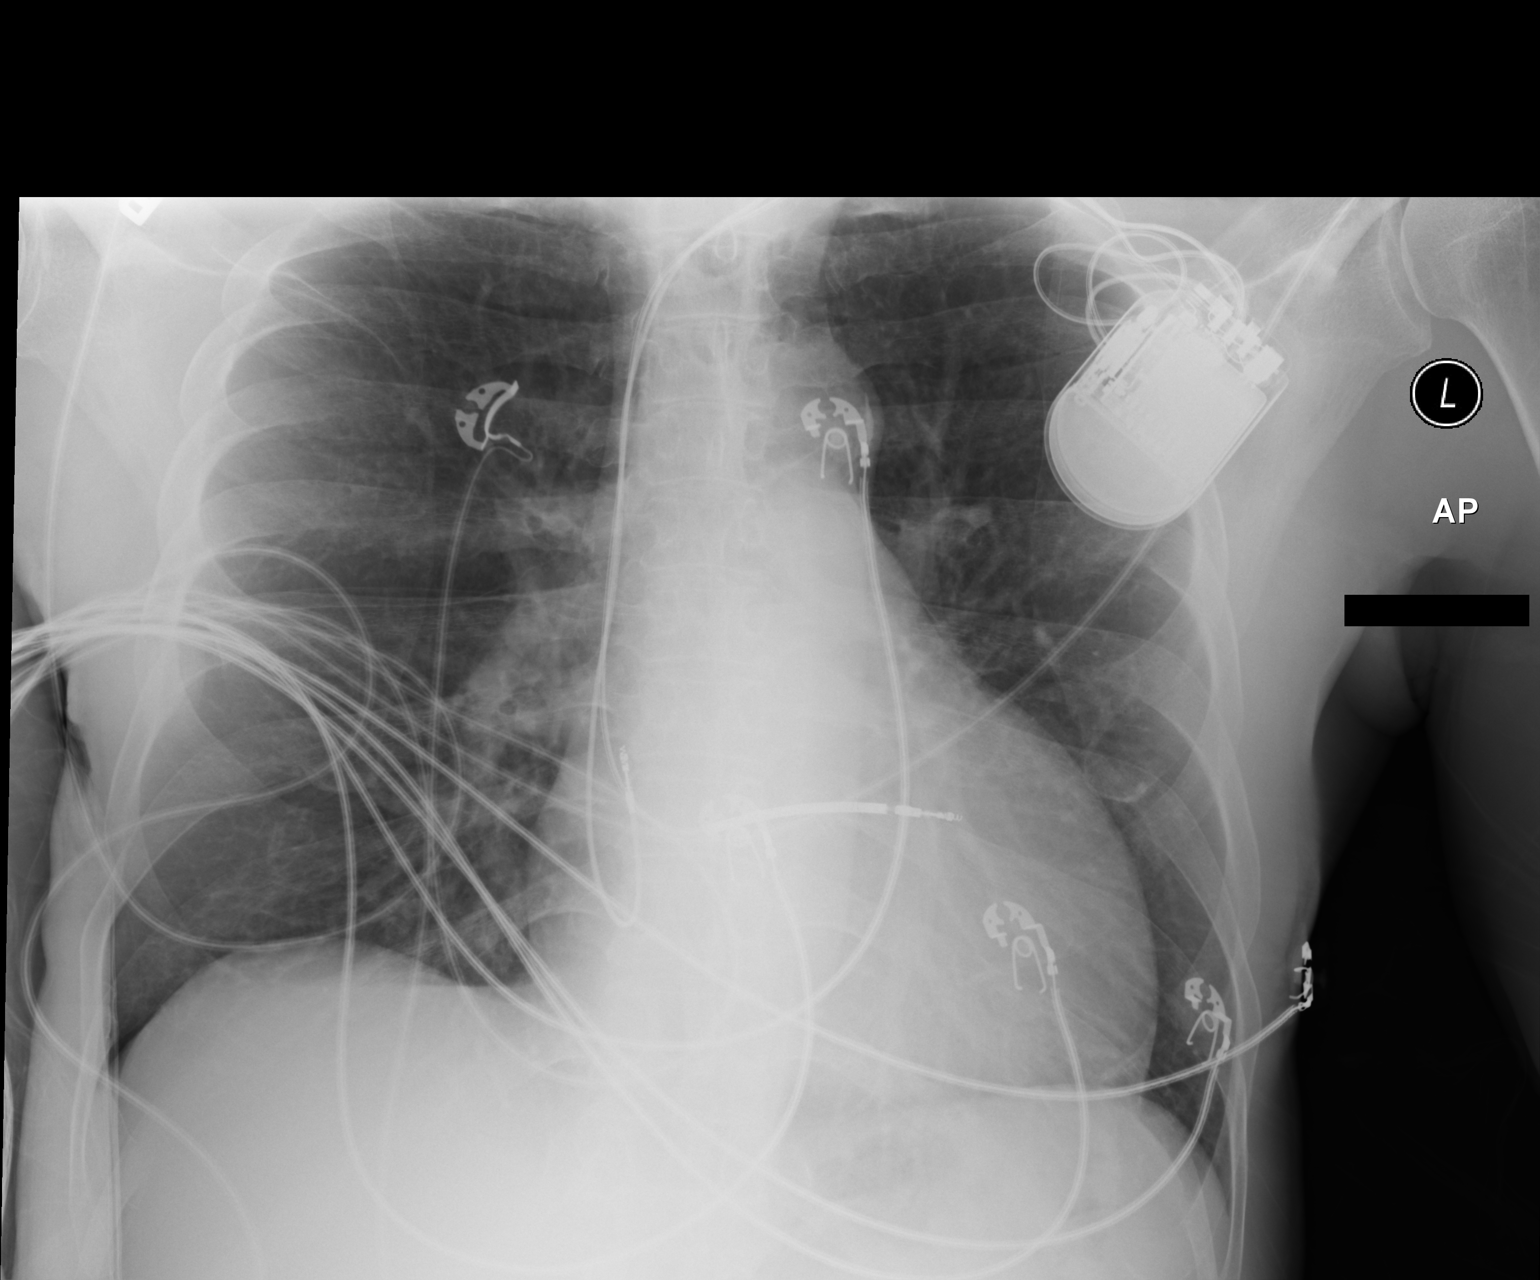

[2 of 2 positions shown; findings below may reference images not displayed]

FINDINGS: Transvenous leads appear intact. There is unchanged cardiomegaly.
The lungs are clear. No large effusions are evident. Pulmonary
vasculature is normal.
IMPRESSION: Stable cardiomegaly.  No acute cardiopulmonary findings.

## 2014-12-03 MED ORDER — SODIUM CHLORIDE 0.9 % IV BOLUS (SEPSIS)
1000.0000 mL | Freq: Once | INTRAVENOUS | Status: AC
  Administered 2014-12-03: 1000 mL via INTRAVENOUS

## 2014-12-03 NOTE — ED Provider Notes (Addendum)
This chart was scribed for [place], DO by Peyton Bottoms, ED Scribe. This patient was seen in room D35C/D35C and the patient's care was started at 3:39 AM.  TIME SEEN: 3:39 AM  CHIEF COMPLAINT: Cough  [id number] Melhorn is a 54 y.o. male with a PMHx of chronic systolic heart failure that is post pacemaker/defibrillator, hypertension, hyperlipidemia, diabetes who presents to the Emergency Department complaining of moderate productive cough with yellow sputum that began yesterday night.  He reports associated shortness of breath with began at 1800 yesterday and has been constant since, lightheadedness.   Denies any fever. He states that pain is exacerbated by laying down supine or with movement. He also reports mild pain in left leg for the past 4 days. He denies hx of PE or DVT. Patient is a smoker denies asthma or COPD. Does not wear oxygen at home. He denies associated chest pain. Patient is seen by cardiologist Dr. Haroldine Laws.  States he has had similar symptoms when he has been hyperglycemic in the past.   ROS: See HPI Constitutional: no fever  Eyes: no drainage  ENT: no runny nose   Cardiovascular:  no chest pain  Resp: SOB; cough GI: no vomiting or diarrhea GU: no dysuria Integumentary: no rash  Allergy: no hives  Musculoskeletal: no leg swelling  Neurological: no slurred speech; lightheaded; dizziness ROS otherwise negative  PAST MEDICAL HISTORY/PAST SURGICAL HISTORY:  Past Medical History  Diagnosis Date  . Chronic systolic heart failure     a) Mixed ICM/NICM b) RHC (05/2014): RA 2, RV 19/2/3, PA 22/14 (18), PCWP 6, Fick CO/CI: 5.2 / 2.7, PVR 2.3 WU, PA 60% and 64% c) ECHO (05/2014): EF 20-25%, diff HK, akinesis entireanteroseptal myocardium, triv AI, mod MR, LA mod/sev dilated  . Automatic implantable cardioverter-defibrillator in situ   . Heart murmur   . Sleep apnea     "cleared after T&A"  . Left ventricular noncompaction   . Ischemic cardiomyopathy     a) Coronary  angiography (12/2008) at [facility]: Lmain: nl, LAD mid 100% stenosis with left to left and right-to-left collaterals to the distal LAD; Lcx: nl, RCA nl.    . Type II diabetes mellitus   . High cholesterol   . Pneumonia 1988  . Drug abuse and dependence     MEDICATIONS:  Prior to Admission medications   Medication Sig Start Date End Date Taking? Authorizing Provider  acetaminophen (TYLENOL) 500 MG tablet Take 1,000 mg by mouth every 6 (six) hours as needed for moderate pain.   Yes Historical Provider, MD  atorvastatin (LIPITOR) 40 MG tablet Take 1 tablet (40 mg total) by mouth daily at 6 PM. 09/25/14  Yes Amy D Clegg, NP  carvedilol (COREG) 3.125 MG tablet Take 1 tablet (3.125 mg total) by mouth 2 (two) times daily with a meal. 11/16/14  Yes Larey Dresser, MD  digoxin (LANOXIN) 0.125 MG tablet Take 1 tablet (0.125 mg total) by mouth daily. 11/16/14  Yes Larey Dresser, MD  furosemide (LASIX) 40 MG tablet Take 1 tablet (40 mg total) by mouth every other day. 12/01/14  Yes Amy D Clegg, NP  glucose blood (ONETOUCH VERIO) test strip Use as instructed 11/30/14  Yes Mauricio Po, FNP  Insulin Glargine (LANTUS) 100 UNIT/ML Solostar Pen Inject 32 Units into the skin daily at 10 pm. 11/30/14  Yes Mauricio Po, FNP  QC PEN NEEDLES 31G X 6 MM MISC 1 each by Other route See admin instructions. Use daily with insulin. 08/06/12  Yes Historical Provider, MD  traMADol (ULTRAM) 50 MG tablet Take 1 tablet (50 mg total) by mouth every 12 (twelve) hours as needed for moderate pain. 09/25/14  Yes Amy D Clegg, NP  warfarin (COUMADIN) 7.5 MG tablet Take 7.5 mg by mouth daily.   Yes Historical Provider, MD  metFORMIN (GLUCOPHAGE) 500 MG tablet Take 1 tablet (500 mg total) by mouth 2 (two) times daily with a meal. Patient not taking: Reported on 12/03/2014 12/01/14   Mauricio Po, FNP  warfarin (COUMADIN) 5 MG tablet Take as directed by Coumadin clinic Patient not taking: Reported on 12/03/2014 11/20/14   Larey Dresser, MD     ALLERGIES:  No Known Allergies  SOCIAL HISTORY:  History  Substance Use Topics  . Smoking status: Current Some Day Smoker -- 0.10 packs/day for 31 years    Types: Cigarettes  . Smokeless tobacco: Never Used  . Alcohol Use: 0.0 oz/week    0 Standard drinks or equivalent per week     Comment: 09/21/2014 "might have a drink q 6 /months, if that"    FAMILY HISTORY: Family History  Problem Relation Age of Onset  . Diabetes Mother   . Heart disease Mother   . Diabetes Father   . Prostate cancer Father   . Heart disease Father   . Diabetes Brother   . Diabetes Brother     EXAM: Triage Vitals: BP 105/76 mmHg  Pulse 107  Temp(Src) 98.5 F (36.9 C)  Resp 27  SpO2 97%  CONSTITUTIONAL: Alert and oriented and responds appropriately to questions. Well-appearing; well-nourished, in no distress HEAD: Normocephalic EYES: Conjunctivae clear, PERRL ENT: normal nose; no rhinorrhea; dry mucous membranes; pharynx without lesions noted NECK: Supple, no meningismus, no LAD  CARD: Regular and tachycardic; S1 and S2 appreciated; no murmurs, no clicks, no rubs, no gallops RESP: Normal chest excursion without splinting or tachypnea; breath sounds clear and equal bilaterally; no wheezes, no rhonchi, no rales, no hypoxia or respiratory distress, speaking full sentences ABD/GI: Normal bowel sounds; non-distended; soft, non-tender, no rebound, no guarding BACK:  The back appears normal and is non-tender to palpation, there is no CVA tenderness EXT: Normal ROM in all joints; non-tender to palpation; no edema; normal capillary refill; no cyanosis, no calf tenderness or swelling    SKIN: Normal color for age and race; warm NEURO: Moves all extremities equally, sensation to light touch intact diffusely PSYCH: The patient's mood and manner are appropriate. Grooming and personal hygiene are appropriate.  MEDICAL DECISION MAKING: Patient here with shortness of breath, lightheadedness. He is mildly  hypotensive in the emergency department and appears dry on exam. He is on Lasix every other day for CHF. He is also hyperglycemic in the emergency department. Suspect he is dehydrated. We'll give gentle IV hydration given his history of CHF. We'll obtain cardiac labs, chest x-ray. No recent infectious symptoms. Nontoxic appearing. Afebrile. No complaints of pain. EKG shows no new ischemic changes.  ED PROGRESS:   Patient's labs are unremarkable other than an elevated potassium which is likely secondary to hemolysis. We'll recheck. He is not in DKA. First troponin negative. BNP mildly elevated at 321 but chest x-ray clear. Blood pressure improving with IV hydration he states he feels much better. We'll check second troponin, d-dimer. We'll ambulate patient.   Second troponin negative. D-dimer also negative. Repeat potassium is normal. Blood glucose has improved.  Blood pressure stable. Reports his dizziness is gone is able to ambulate without difficulty and no hypoxia. Suspect  symptoms secondary to hyperglycemia and mild dehydration. On review of patient's record his systolic blood pressure is normally in the 90s-100s. We'll discharge patient home with close outpatient follow-up.  States he would prefer discharge over admission. Discuss with him strict return precautions. He verbalizes understanding and is comfortable with plan.    EKG Interpretation  Date/Time:  Sunday December 03 2014 02:20:30 EST Ventricular Rate:  105 PR Interval:  207 QRS Duration: 74 QT Interval:  336 QTC Calculation: 444 R Axis:   -46 Text Interpretation:  Age not entered, assumed to be  54 years old for purpose of ECG interpretation Sinus tachycardia Prolonged PR interval Left atrial enlargement Left anterior fascicular block Left ventricular hypertrophy No significant change since last tracing December 2015 Confirmed by WARD,  DO, KRISTEN [phone]) on 12/03/2014 2:30:57 AM        I personally performed the services  described in this documentation, which was scribed in my presence. The recorded information has been reviewed and is accurate.   [place], DO 12/03/14 [place] Ward, DO 12/03/14 [place], DO 12/03/14 [phone]

## 2014-12-03 NOTE — Discharge Instructions (Signed)
Hyperglycemia °Hyperglycemia occurs when the glucose (sugar) in your blood is too high. Hyperglycemia can happen for many reasons, but it most often happens to people who do not know they have diabetes or are not managing their diabetes properly.  °CAUSES  °Whether you have diabetes or not, there are other causes of hyperglycemia. Hyperglycemia can occur when you have diabetes, but it can also occur in other situations that you might not be as aware of, such as: °Diabetes °· If you have diabetes and are having problems controlling your blood glucose, hyperglycemia could occur because of some of the following reasons: °· Not following your meal plan. °· Not taking your diabetes medications or not taking it properly. °· Exercising less or doing less activity than you normally do. °· Being sick. °Pre-diabetes °· This cannot be ignored. Before people develop Type 2 diabetes, they almost always have "pre-diabetes." This is when your blood glucose levels are higher than normal, but not yet high enough to be diagnosed as diabetes. Research has shown that some long-term damage to the body, especially the heart and circulatory system, may already be occurring during pre-diabetes. If you take action to manage your blood glucose when you have pre-diabetes, you may delay or prevent Type 2 diabetes from developing. °Stress °· If you have diabetes, you may be "diet" controlled or on oral medications or insulin to control your diabetes. However, you may find that your blood glucose is higher than usual in the hospital whether you have diabetes or not. This is often referred to as "stress hyperglycemia." Stress can elevate your blood glucose. This happens because of hormones put out by the body during times of stress. If stress has been the cause of your high blood glucose, it can be followed regularly by your caregiver. That way he/she can make sure your hyperglycemia does not continue to get worse or progress to  diabetes. °Steroids °· Steroids are medications that act on the infection fighting system (immune system) to block inflammation or infection. One side effect can be a rise in blood glucose. Most people can produce enough extra insulin to allow for this rise, but for those who cannot, steroids make blood glucose levels go even higher. It is not unusual for steroid treatments to "uncover" diabetes that is developing. It is not always possible to determine if the hyperglycemia will go away after the steroids are stopped. A special blood test called an A1c is sometimes done to determine if your blood glucose was elevated before the steroids were started. °SYMPTOMS °· Thirsty. °· Frequent urination. °· Dry mouth. °· Blurred vision. °· Tired or fatigue. °· Weakness. °· Sleepy. °· Tingling in feet or leg. °DIAGNOSIS  °Diagnosis is made by monitoring blood glucose in one or all of the following ways: °· A1c test. This is a chemical found in your blood. °· Fingerstick blood glucose monitoring. °· Laboratory results. °TREATMENT  °First, knowing the cause of the hyperglycemia is important before the hyperglycemia can be treated. Treatment may include, but is not be limited to: °· Education. °· Change or adjustment in medications. °· Change or adjustment in meal plan. °· Treatment for an illness, infection, etc. °· More frequent blood glucose monitoring. °· Change in exercise plan. °· Decreasing or stopping steroids. °· Lifestyle changes. °HOME CARE INSTRUCTIONS  °· Test your blood glucose as directed. °· Exercise regularly. Your caregiver will give you instructions about exercise. Pre-diabetes or diabetes which comes on with stress is helped by exercising. °· Eat wholesome,   balanced meals. Eat often and at regular, fixed times. Your caregiver or nutritionist will give you a meal plan to guide your sugar intake.  Being at an ideal weight is important. If needed, losing as little as 10 to 15 pounds may help improve blood  glucose levels. SEEK MEDICAL CARE IF:   You have questions about medicine, activity, or diet.  You continue to have symptoms (problems such as increased thirst, urination, or weight gain). SEEK IMMEDIATE MEDICAL CARE IF:   You are vomiting or have diarrhea.  Your breath smells fruity.  You are breathing faster or slower.  You are very sleepy or incoherent.  You have numbness, tingling, or pain in your feet or hands.  You have chest pain.  Your symptoms get worse even though you have been following your caregiver's orders.  If you have any other questions or concerns. Document Released: 03/25/2001 Document Revised: 12/22/2011 Document Reviewed: 01/26/2012 [facility] Patient Information 2015 [place], [place]. This information is not intended to replace advice given to you by your health care provider. Make sure you discuss any questions you have with your health care provider.   Shortness of Breath Shortness of breath means you have trouble breathing. It could also mean that you have a medical problem. You should get immediate medical care for shortness of breath. CAUSES   Not enough oxygen in the air such as with high altitudes or a smoke-filled room.  Certain lung diseases, infections, or problems.  Heart disease or conditions, such as angina or heart failure.  Low red blood cells (anemia).  Poor physical fitness, which can cause shortness of breath when you exercise.  Chest or back injuries or stiffness.  Being overweight.  Smoking.  Anxiety, which can make you feel like you are not getting enough air. DIAGNOSIS  Serious medical problems can often be found during your physical exam. Tests may also be done to determine why you are having shortness of breath. Tests may include:  Chest X-rays.  Lung function tests.  Blood tests.  An electrocardiogram (ECG).  An ambulatory electrocardiogram. An ambulatory ECG records your heartbeat patterns over a 24-hour  period.  Exercise testing.  A transthoracic echocardiogram (TTE). During echocardiography, sound waves are used to evaluate how blood flows through your heart.  A transesophageal echocardiogram (TEE).  Imaging scans. Your health care provider may not be able to find a cause for your shortness of breath after your exam. In this case, it is important to have a follow-up exam with your health care provider as directed.  TREATMENT  Treatment for shortness of breath depends on the cause of your symptoms and can vary greatly. HOME CARE INSTRUCTIONS   Do not smoke. Smoking is a common cause of shortness of breath. If you smoke, ask for help to quit.  Avoid being around chemicals or things that may bother your breathing, such as paint fumes and dust.  Rest as needed. Slowly resume your usual activities.  If medicines were prescribed, take them as directed for the full length of time directed. This includes oxygen and any inhaled medicines.  Keep all follow-up appointments as directed by your health care provider. SEEK MEDICAL CARE IF:   Your condition does not improve in the time expected.  You have a hard time doing your normal activities even with rest.  You have any new symptoms. SEEK IMMEDIATE MEDICAL CARE IF:   Your shortness of breath gets worse.  You feel light-headed, faint, or develop a cough not controlled  with medicines.  You start coughing up blood.  You have pain with breathing.  You have chest pain or pain in your arms, shoulders, or abdomen.  You have a fever.  You are unable to walk up stairs or exercise the way you normally do. MAKE SURE YOU:  Understand these instructions.  Will watch your condition.  Will get help right away if you are not doing well or get worse. Document Released: 06/24/2001 Document Revised: 10/04/2013 Document Reviewed: 12/15/2011 [facility] Patient Information 2015 [place], [place]. This information is not intended to replace advice  given to you by your health care provider. Make sure you discuss any questions you have with your health care provider.  Dehydration, Adult Dehydration is when you lose more fluids from the body than you take in. Vital organs like the kidneys, brain, and heart cannot function without a proper amount of fluids and salt. Any loss of fluids from the body can cause dehydration.  CAUSES   Vomiting.  Diarrhea.  Excessive sweating.  Excessive urine output.  Fever. SYMPTOMS  Mild dehydration  Thirst.  Dry lips.  Slightly dry mouth. Moderate dehydration  Very dry mouth.  Sunken eyes.  Skin does not bounce back quickly when lightly pinched and released.  Dark urine and decreased urine production.  Decreased tear production.  Headache. Severe dehydration  Very dry mouth.  Extreme thirst.  Rapid, weak pulse (more than 100 beats per minute at rest).  Cold hands and feet.  Not able to sweat in spite of heat and temperature.  Rapid breathing.  Blue lips.  Confusion and lethargy.  Difficulty being awakened.  Minimal urine production.  No tears. DIAGNOSIS  Your caregiver will diagnose dehydration based on your symptoms and your exam. Blood and urine tests will help confirm the diagnosis. The diagnostic evaluation should also identify the cause of dehydration. TREATMENT  Treatment of mild or moderate dehydration can often be done at home by increasing the amount of fluids that you drink. It is best to drink small amounts of fluid more often. Drinking too much at one time can make vomiting worse. Refer to the home care instructions below. Severe dehydration needs to be treated at the hospital where you will probably be given intravenous (IV) fluids that contain water and electrolytes. HOME CARE INSTRUCTIONS   Ask your caregiver about specific rehydration instructions.  Drink enough fluids to keep your urine clear or pale yellow.  Drink small amounts frequently if  you have nausea and vomiting.  Eat as you normally do.  Avoid:  Foods or drinks high in sugar.  Carbonated drinks.  Juice.  Extremely hot or cold fluids.  Drinks with caffeine.  Fatty, greasy foods.  Alcohol.  Tobacco.  Overeating.  Gelatin desserts.  Wash your hands well to avoid spreading bacteria and viruses.  Only take over-the-counter or prescription medicines for pain, discomfort, or fever as directed by your caregiver.  Ask your caregiver if you should continue all prescribed and over-the-counter medicines.  Keep all follow-up appointments with your caregiver. SEEK MEDICAL CARE IF:  You have abdominal pain and it increases or stays in one area (localizes).  You have a rash, stiff neck, or severe headache.  You are irritable, sleepy, or difficult to awaken.  You are weak, dizzy, or extremely thirsty. SEEK IMMEDIATE MEDICAL CARE IF:   You are unable to keep fluids down or you get worse despite treatment.  You have frequent episodes of vomiting or diarrhea.  You have blood or green  matter (bile) in your vomit.  You have blood in your stool or your stool looks black and tarry.  You have not urinated in 6 to 8 hours, or you have only urinated a small amount of very dark urine.  You have a fever.  You faint. MAKE SURE YOU:   Understand these instructions.  Will watch your condition.  Will get help right away if you are not doing well or get worse. Document Released: 09/29/2005 Document Revised: 12/22/2011 Document Reviewed: 05/19/2011 [facility] Patient Information 2015 [place], [place]. This information is not intended to replace advice given to you by your health care provider. Make sure you discuss any questions you have with your health care provider.

## 2014-12-03 NOTE — ED Notes (Signed)
Productive cough all night with sob.  Sputum color yellow and he is saying his blood sugar is elevated also.  Sl dizziness with ambulation

## 2014-12-03 NOTE — ED Notes (Signed)
Ambulated pt. sats stayed at 99. No distress noted.

## 2014-12-04 ENCOUNTER — Encounter: Payer: Self-pay | Admitting: Internal Medicine

## 2014-12-04 ENCOUNTER — Ambulatory Visit (HOSPITAL_COMMUNITY): Payer: Self-pay

## 2014-12-06 ENCOUNTER — Ambulatory Visit (HOSPITAL_COMMUNITY): Payer: Self-pay

## 2014-12-07 ENCOUNTER — Telehealth (HOSPITAL_COMMUNITY): Payer: Self-pay | Admitting: *Deleted

## 2014-12-07 NOTE — Telephone Encounter (Signed)
Called pt and left message regarding insurance benefits for cardiac rehab.  Spoke to a [facility] Medicare representative reference id [id number].  Pt has co pay of 40.00 per exercise session.  Pt oop is  4,900 pt has 18.00 applied to this.  Does not require pre auth or pre cert.  Contact information provided for pt to please call back. Cherre Huger, BSN

## 2014-12-08 ENCOUNTER — Ambulatory Visit: Payer: Medicare Other | Admitting: Internal Medicine

## 2014-12-08 ENCOUNTER — Ambulatory Visit (HOSPITAL_COMMUNITY): Payer: Self-pay

## 2014-12-11 ENCOUNTER — Ambulatory Visit (HOSPITAL_COMMUNITY): Payer: Self-pay

## 2014-12-13 ENCOUNTER — Ambulatory Visit (HOSPITAL_COMMUNITY): Payer: Self-pay

## 2014-12-14 ENCOUNTER — Ambulatory Visit (HOSPITAL_COMMUNITY): Payer: Medicare Other

## 2014-12-15 ENCOUNTER — Ambulatory Visit (HOSPITAL_COMMUNITY): Payer: Self-pay

## 2014-12-18 ENCOUNTER — Ambulatory Visit (HOSPITAL_COMMUNITY): Payer: Self-pay

## 2014-12-20 ENCOUNTER — Ambulatory Visit (HOSPITAL_COMMUNITY): Payer: Self-pay

## 2014-12-22 ENCOUNTER — Ambulatory Visit (HOSPITAL_COMMUNITY): Payer: Self-pay

## 2014-12-22 ENCOUNTER — Encounter (HOSPITAL_COMMUNITY): Payer: Medicare Other

## 2014-12-25 ENCOUNTER — Ambulatory Visit (HOSPITAL_COMMUNITY): Payer: Self-pay

## 2014-12-27 ENCOUNTER — Ambulatory Visit (HOSPITAL_COMMUNITY): Payer: Self-pay

## 2014-12-29 ENCOUNTER — Ambulatory Visit (HOSPITAL_COMMUNITY): Payer: Self-pay

## 2015-01-01 ENCOUNTER — Ambulatory Visit (HOSPITAL_COMMUNITY): Payer: Self-pay

## 2015-01-03 ENCOUNTER — Ambulatory Visit (HOSPITAL_COMMUNITY): Payer: Self-pay

## 2015-01-05 ENCOUNTER — Ambulatory Visit (HOSPITAL_COMMUNITY): Payer: Self-pay

## 2015-01-08 ENCOUNTER — Ambulatory Visit (HOSPITAL_COMMUNITY): Payer: Self-pay

## 2015-01-08 ENCOUNTER — Ambulatory Visit: Payer: Medicare Other | Admitting: Family

## 2015-01-10 ENCOUNTER — Ambulatory Visit (HOSPITAL_COMMUNITY): Payer: Self-pay

## 2015-01-12 ENCOUNTER — Telehealth: Payer: Self-pay | Admitting: *Deleted

## 2015-01-12 ENCOUNTER — Ambulatory Visit (HOSPITAL_COMMUNITY): Payer: Self-pay

## 2015-01-12 NOTE — Telephone Encounter (Signed)
LMTCB/[id number] 

## 2015-01-15 ENCOUNTER — Ambulatory Visit (HOSPITAL_COMMUNITY): Payer: Self-pay

## 2015-01-17 ENCOUNTER — Ambulatory Visit (HOSPITAL_COMMUNITY): Payer: Self-pay

## 2015-01-19 ENCOUNTER — Ambulatory Visit (HOSPITAL_COMMUNITY): Payer: Self-pay

## 2015-01-22 ENCOUNTER — Encounter: Payer: Self-pay | Admitting: Family

## 2015-01-22 ENCOUNTER — Ambulatory Visit (HOSPITAL_COMMUNITY): Payer: Self-pay

## 2015-01-24 ENCOUNTER — Inpatient Hospital Stay (HOSPITAL_COMMUNITY)
Admission: EM | Admit: 2015-01-24 | Discharge: 2015-01-26 | DRG: 065 | Disposition: A | Discharge status: Rehabilitation Facility | Payer: Medicare Other | Attending: Internal Medicine | Admitting: Internal Medicine

## 2015-01-24 ENCOUNTER — Emergency Department (HOSPITAL_COMMUNITY): Payer: Medicare Other

## 2015-01-24 ENCOUNTER — Ambulatory Visit (HOSPITAL_COMMUNITY): Payer: Self-pay

## 2015-01-24 ENCOUNTER — Encounter (HOSPITAL_COMMUNITY): Payer: Self-pay | Admitting: Emergency Medicine

## 2015-01-24 DIAGNOSIS — I10 Essential (primary) hypertension: Secondary | ICD-10-CM | POA: Diagnosis present

## 2015-01-24 DIAGNOSIS — G8194 Hemiplegia, unspecified affecting left nondominant side: Secondary | ICD-10-CM | POA: Diagnosis present

## 2015-01-24 DIAGNOSIS — F17210 Nicotine dependence, cigarettes, uncomplicated: Secondary | ICD-10-CM | POA: Diagnosis present

## 2015-01-24 DIAGNOSIS — I63411 Cerebral infarction due to embolism of right middle cerebral artery: Secondary | ICD-10-CM | POA: Diagnosis not present

## 2015-01-24 DIAGNOSIS — R471 Dysarthria and anarthria: Secondary | ICD-10-CM | POA: Diagnosis present

## 2015-01-24 DIAGNOSIS — I63521 Cerebral infarction due to unspecified occlusion or stenosis of right anterior cerebral artery: Principal | ICD-10-CM | POA: Diagnosis present

## 2015-01-24 DIAGNOSIS — I5022 Chronic systolic (congestive) heart failure: Secondary | ICD-10-CM | POA: Diagnosis present

## 2015-01-24 DIAGNOSIS — I428 Other cardiomyopathies: Secondary | ICD-10-CM | POA: Insufficient documentation

## 2015-01-24 DIAGNOSIS — I255 Ischemic cardiomyopathy: Secondary | ICD-10-CM | POA: Diagnosis present

## 2015-01-24 DIAGNOSIS — F1410 Cocaine abuse, uncomplicated: Secondary | ICD-10-CM | POA: Diagnosis present

## 2015-01-24 DIAGNOSIS — E785 Hyperlipidemia, unspecified: Secondary | ICD-10-CM | POA: Diagnosis present

## 2015-01-24 DIAGNOSIS — Z79899 Other long term (current) drug therapy: Secondary | ICD-10-CM | POA: Diagnosis not present

## 2015-01-24 DIAGNOSIS — R531 Weakness: Secondary | ICD-10-CM | POA: Diagnosis present

## 2015-01-24 DIAGNOSIS — R9430 Abnormal result of cardiovascular function study, unspecified: Secondary | ICD-10-CM | POA: Diagnosis not present

## 2015-01-24 DIAGNOSIS — Z91199 Patient's noncompliance with other medical treatment and regimen due to unspecified reason: Secondary | ICD-10-CM

## 2015-01-24 DIAGNOSIS — Z95810 Presence of automatic (implantable) cardiac defibrillator: Secondary | ICD-10-CM | POA: Diagnosis not present

## 2015-01-24 DIAGNOSIS — I69354 Hemiplegia and hemiparesis following cerebral infarction affecting left non-dominant side: Secondary | ICD-10-CM | POA: Diagnosis not present

## 2015-01-24 DIAGNOSIS — I2582 Chronic total occlusion of coronary artery: Secondary | ICD-10-CM | POA: Diagnosis not present

## 2015-01-24 DIAGNOSIS — I69391 Dysphagia following cerebral infarction: Secondary | ICD-10-CM | POA: Diagnosis not present

## 2015-01-24 DIAGNOSIS — I639 Cerebral infarction, unspecified: Secondary | ICD-10-CM | POA: Diagnosis present

## 2015-01-24 DIAGNOSIS — I2510 Atherosclerotic heart disease of native coronary artery without angina pectoris: Secondary | ICD-10-CM | POA: Diagnosis present

## 2015-01-24 DIAGNOSIS — E118 Type 2 diabetes mellitus with unspecified complications: Secondary | ICD-10-CM

## 2015-01-24 DIAGNOSIS — E1165 Type 2 diabetes mellitus with hyperglycemia: Secondary | ICD-10-CM | POA: Diagnosis present

## 2015-01-24 DIAGNOSIS — R29810 Facial weakness: Secondary | ICD-10-CM | POA: Diagnosis present

## 2015-01-24 DIAGNOSIS — I6350 Cerebral infarction due to unspecified occlusion or stenosis of unspecified cerebral artery: Secondary | ICD-10-CM

## 2015-01-24 DIAGNOSIS — Z794 Long term (current) use of insulin: Secondary | ICD-10-CM

## 2015-01-24 DIAGNOSIS — I959 Hypotension, unspecified: Secondary | ICD-10-CM | POA: Diagnosis not present

## 2015-01-24 DIAGNOSIS — Z7901 Long term (current) use of anticoagulants: Secondary | ICD-10-CM | POA: Diagnosis not present

## 2015-01-24 DIAGNOSIS — R931 Abnormal findings on diagnostic imaging of heart and coronary circulation: Secondary | ICD-10-CM | POA: Insufficient documentation

## 2015-01-24 DIAGNOSIS — R1310 Dysphagia, unspecified: Secondary | ICD-10-CM

## 2015-01-24 DIAGNOSIS — R0989 Other specified symptoms and signs involving the circulatory and respiratory systems: Secondary | ICD-10-CM | POA: Insufficient documentation

## 2015-01-24 DIAGNOSIS — Z9114 Patient's other noncompliance with medication regimen: Secondary | ICD-10-CM | POA: Diagnosis present

## 2015-01-24 DIAGNOSIS — I5021 Acute systolic (congestive) heart failure: Secondary | ICD-10-CM | POA: Diagnosis present

## 2015-01-24 DIAGNOSIS — G4733 Obstructive sleep apnea (adult) (pediatric): Secondary | ICD-10-CM | POA: Diagnosis present

## 2015-01-24 DIAGNOSIS — R0602 Shortness of breath: Secondary | ICD-10-CM

## 2015-01-24 DIAGNOSIS — I5023 Acute on chronic systolic (congestive) heart failure: Secondary | ICD-10-CM | POA: Diagnosis not present

## 2015-01-24 DIAGNOSIS — Z720 Tobacco use: Secondary | ICD-10-CM | POA: Diagnosis present

## 2015-01-24 DIAGNOSIS — Z9119 Patient's noncompliance with other medical treatment and regimen: Secondary | ICD-10-CM

## 2015-01-24 DIAGNOSIS — I6789 Other cerebrovascular disease: Secondary | ICD-10-CM | POA: Diagnosis not present

## 2015-01-24 HISTORY — DX: Patient's noncompliance with other medical treatment and regimen due to unspecified reason: Z91.199

## 2015-01-24 HISTORY — DX: Patient's noncompliance with other medical treatment and regimen: Z91.19

## 2015-01-24 HISTORY — DX: Cocaine abuse, uncomplicated: F14.10

## 2015-01-24 HISTORY — DX: Dysphagia following cerebral infarction: I69.391

## 2015-01-24 LAB — URINE MICROSCOPIC-ADD ON

## 2015-01-24 LAB — COMPREHENSIVE METABOLIC PANEL
ALT: 18 U/L (ref 0–53)
AST: 18 U/L (ref 0–37)
Albumin: 3.3 g/dL — ABNORMAL LOW (ref 3.5–5.2)
Alkaline Phosphatase: 99 U/L (ref 39–117)
Anion gap: 8 (ref 5–15)
BUN: 16 mg/dL (ref 6–23)
CO2: 27 mmol/L (ref 19–32)
Calcium: 9.2 mg/dL (ref 8.4–10.5)
Chloride: 96 mmol/L (ref 96–112)
Creatinine, Ser: 1.07 mg/dL (ref 0.50–1.35)
GFR calc Af Amer: >90 mL/min (ref >90)
GFR calc non Af Amer: 78 mL/min — ABNORMAL LOW (ref >90)
Glucose, Bld: 536 mg/dL — ABNORMAL HIGH (ref 70–99)
Potassium: 4.8 mmol/L (ref 3.5–5.1)
Sodium: 131 mmol/L — ABNORMAL LOW (ref 135–145)
Total Bilirubin: 1 mg/dL (ref 0.3–1.2)
Total Protein: 6.3 g/dL (ref 6.0–8.3)

## 2015-01-24 LAB — CBC WITH DIFFERENTIAL/PLATELET
Basophils Absolute: 0 10*3/uL (ref 0.0–0.1)
Basophils Relative: 0 % (ref 0–1)
Eosinophils Absolute: 0 10*3/uL (ref 0.0–0.7)
Eosinophils Relative: 0 % (ref 0–5)
HCT: 46.1 % (ref 39.0–52.0)
Hemoglobin: 15.5 g/dL (ref 13.0–17.0)
Lymphocytes Relative: 23 % (ref 12–46)
Lymphs Abs: 2.2 10*3/uL (ref 0.7–4.0)
MCH: 31 pg (ref 26.0–34.0)
MCHC: 33.6 g/dL (ref 30.0–36.0)
MCV: 92.2 fL (ref 78.0–100.0)
Monocytes Absolute: 0.4 10*3/uL (ref 0.1–1.0)
Monocytes Relative: 4 % (ref 3–12)
Neutro Abs: 6.8 10*3/uL (ref 1.7–7.7)
Neutrophils Relative %: 73 % (ref 43–77)
Platelets: 199 10*3/uL (ref 150–400)
RBC: 5 MIL/uL (ref 4.22–5.81)
RDW: 12.2 % (ref 11.5–15.5)
WBC: 9.4 10*3/uL (ref 4.0–10.5)

## 2015-01-24 LAB — I-STAT CHEM 8, ED
BUN: 19 mg/dL (ref 6–23)
Calcium, Ion: 1.19 mmol/L (ref 1.12–1.23)
Chloride: 95 mmol/L — ABNORMAL LOW (ref 96–112)
Creatinine, Ser: 1 mg/dL (ref 0.50–1.35)
Glucose, Bld: 564 mg/dL (ref 70–99)
HCT: 52 % (ref 39.0–52.0)
Hemoglobin: 17.7 g/dL — ABNORMAL HIGH (ref 13.0–17.0)
Potassium: 4.9 mmol/L (ref 3.5–5.1)
Sodium: 132 mmol/L — ABNORMAL LOW (ref 135–145)
TCO2: 23 mmol/L (ref 0–100)

## 2015-01-24 LAB — URINALYSIS, ROUTINE W REFLEX MICROSCOPIC
Bilirubin Urine: NEGATIVE
Glucose, UA: >1000 mg/dL — AB
Hgb urine dipstick: NEGATIVE
Ketones, ur: 15 mg/dL — AB
Leukocytes, UA: NEGATIVE
Nitrite: NEGATIVE
Protein, ur: NEGATIVE mg/dL
Specific Gravity, Urine: 1.041 — ABNORMAL HIGH (ref 1.005–1.030)
Urobilinogen, UA: 0.2 mg/dL (ref 0.0–1.0)
pH: 5 (ref 5.0–8.0)

## 2015-01-24 LAB — PROTIME-INR
INR: 1.02 (ref 0.00–1.49)
Prothrombin Time: 13.5 seconds (ref 11.6–15.2)

## 2015-01-24 LAB — CBG MONITORING, ED
Glucose-Capillary: 448 mg/dL — ABNORMAL HIGH (ref 70–99)
Glucose-Capillary: 367 mg/dL — ABNORMAL HIGH (ref 70–99)

## 2015-01-24 LAB — I-STAT TROPONIN, ED: Troponin i, poc: 0 ng/mL (ref 0.00–0.08)

## 2015-01-24 LAB — BRAIN NATRIURETIC PEPTIDE: B Natriuretic Peptide: 166.1 pg/mL — ABNORMAL HIGH (ref 0.0–100.0)

## 2015-01-24 LAB — APTT: aPTT: 29 seconds (ref 24–37)

## 2015-01-24 LAB — GLUCOSE, CAPILLARY
Glucose-Capillary: 198 mg/dL — ABNORMAL HIGH (ref 70–99)
Glucose-Capillary: 281 mg/dL — ABNORMAL HIGH (ref 70–99)

## 2015-01-24 LAB — RAPID URINE DRUG SCREEN, HOSP PERFORMED
Amphetamines: NOT DETECTED
Barbiturates: NOT DETECTED
Benzodiazepines: NOT DETECTED
Cocaine: POSITIVE — AB
Opiates: NOT DETECTED
Tetrahydrocannabinol: NOT DETECTED

## 2015-01-24 LAB — ETHANOL: Alcohol, Ethyl (B): <5 mg/dL (ref 0–9)

## 2015-01-24 IMAGING — CT CT HEAD W/O CM
1 series · 16 of 29 positions shown, 20 images · non-contrast
Comparison: None.

CLINICAL DATA: Awoke with left-sided weakness and difficulty
swallowing.

EXAM:
CT HEAD WITHOUT CONTRAST
TECHNIQUE: Contiguous axial images were obtained from the base of the skull
through the vertex without intravenous contrast.

[Series 2: head 5.0 h31s · axial · 0.41mm/px · z∈[+1382,+1512]mm · 16 of 29 slices shown, 20 images]
[im 2/29  brain]
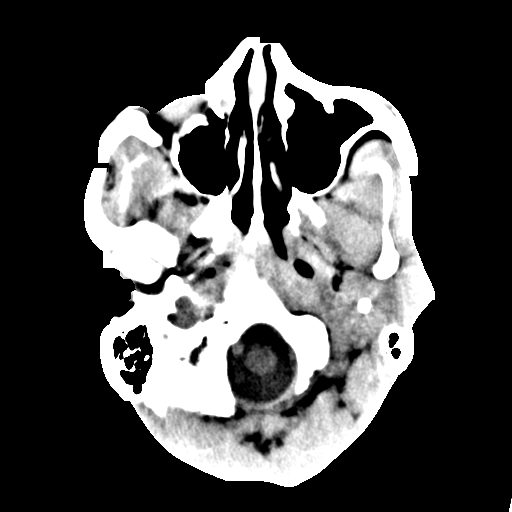
[im 2/29  bone]
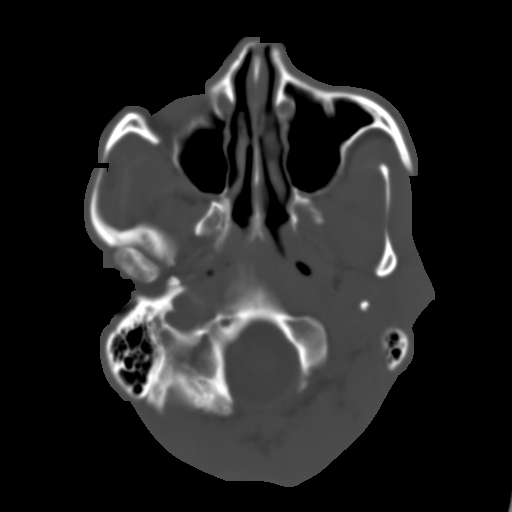
[im 4/29  brain]
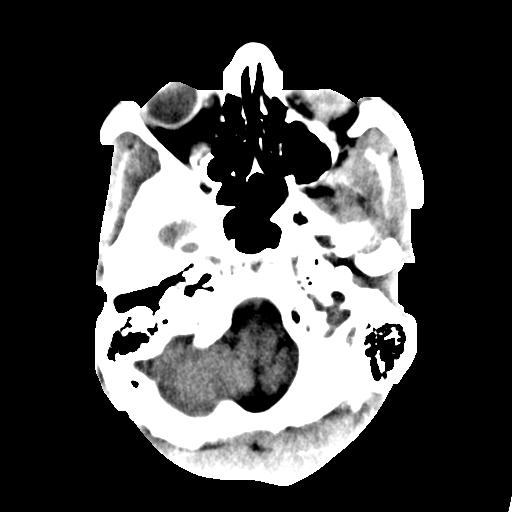
[im 6/29  brain]
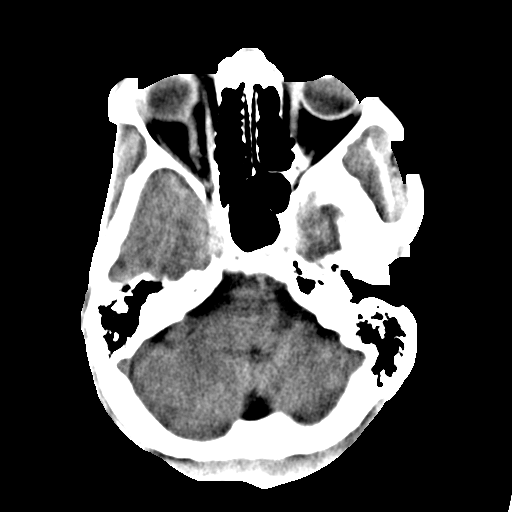
[im 7/29  brain]
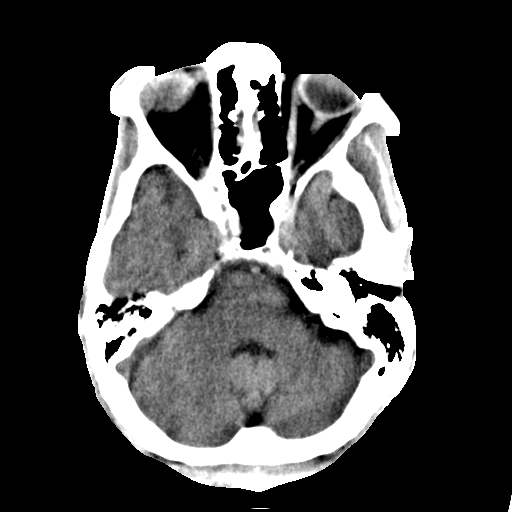
[im 9/29  brain]
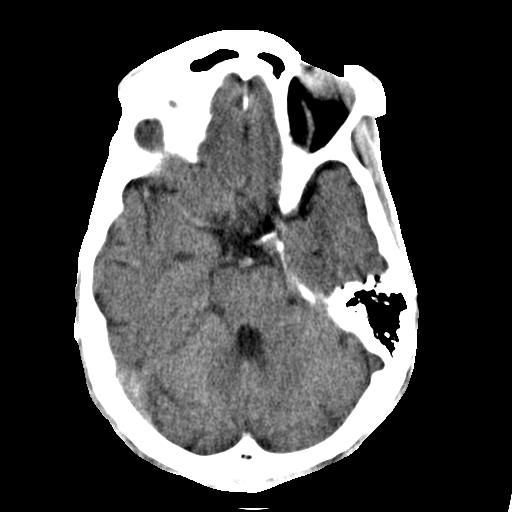
[im 9/29  bone]
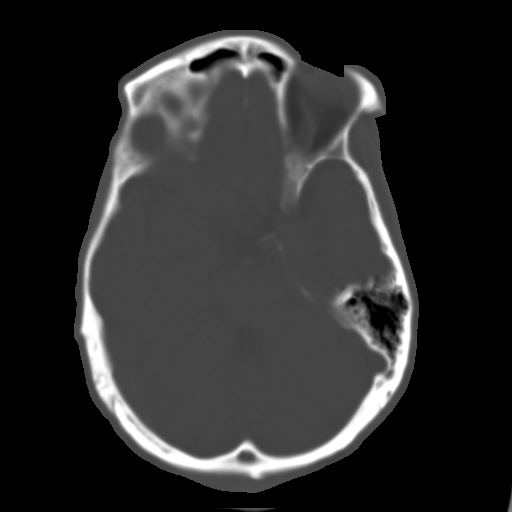
[im 11/29  brain]
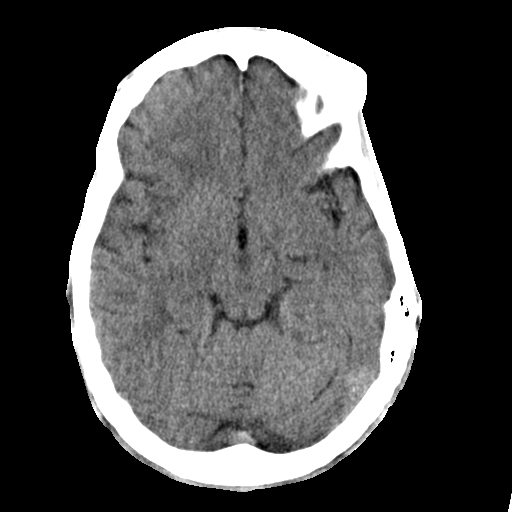
[im 12/29  brain]
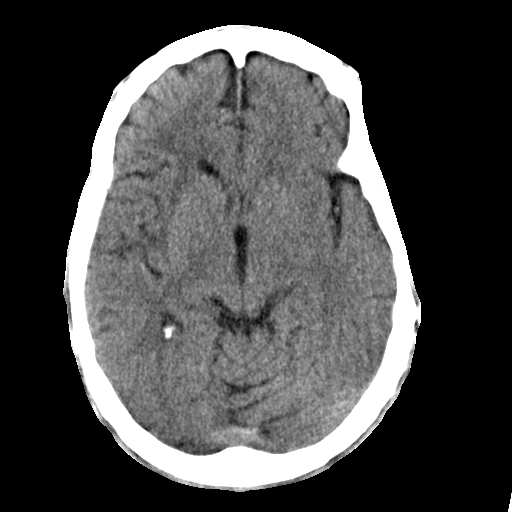
[im 14/29  brain]
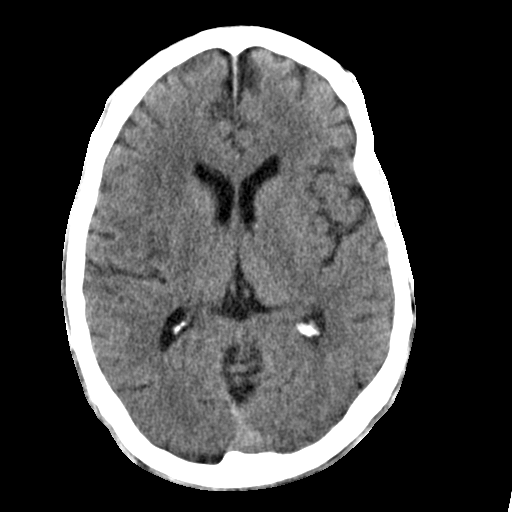
[im 16/29  brain]
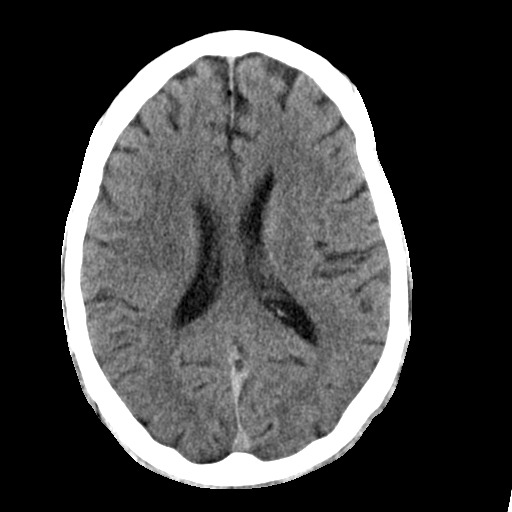
[im 16/29  bone]
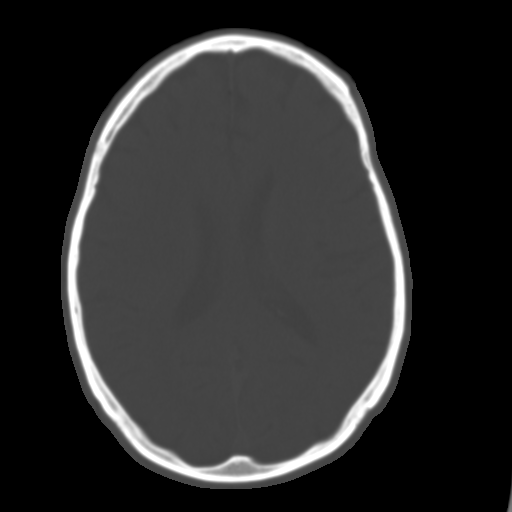
[im 18/29  brain]
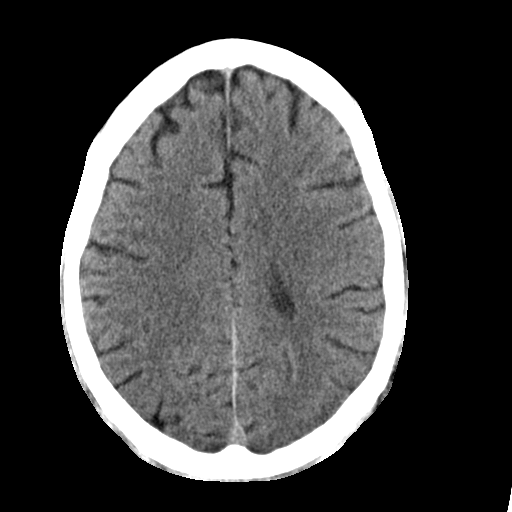
[im 19/29  brain]
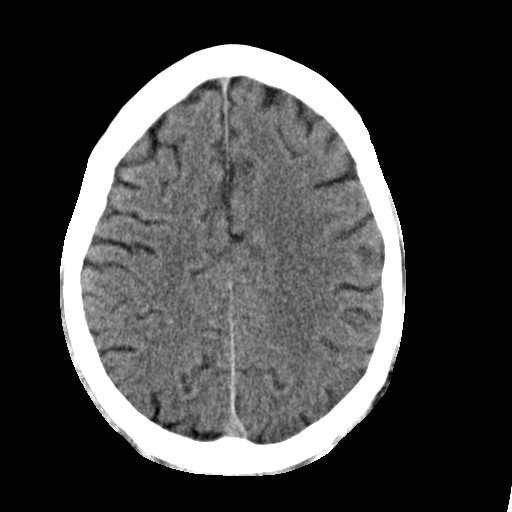
[im 21/29  brain]
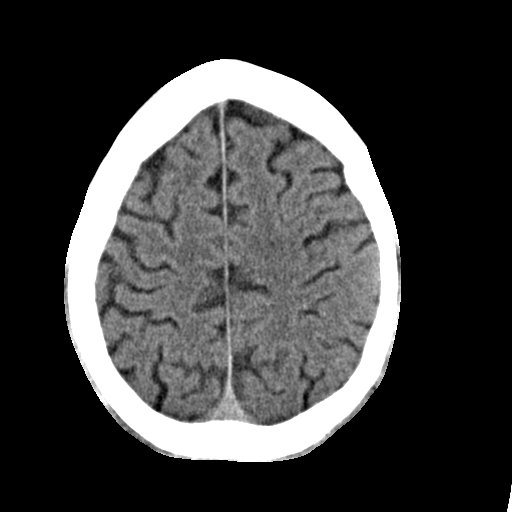
[im 23/29  brain]
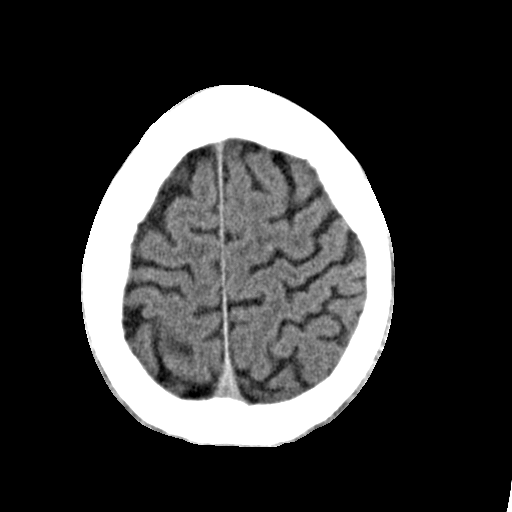
[im 23/29  bone]
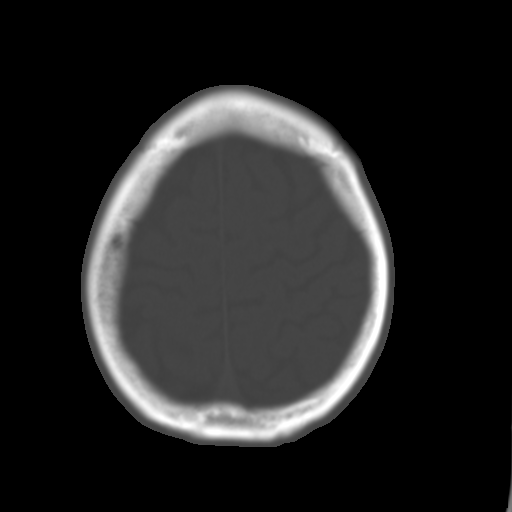
[im 24/29  brain]
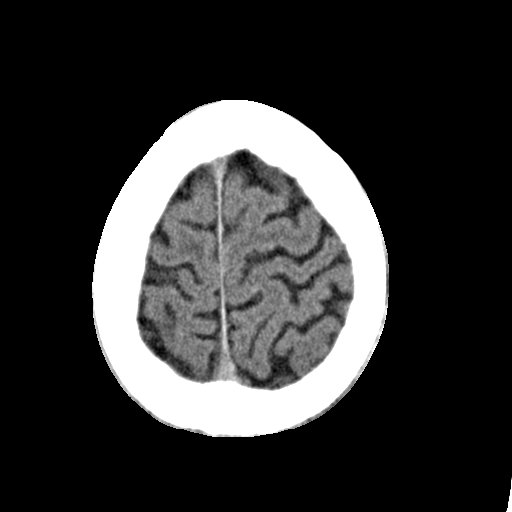
[im 26/29  brain]
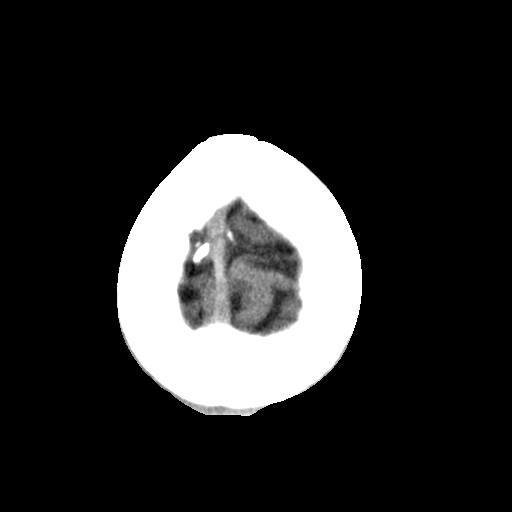
[im 28/29  brain]
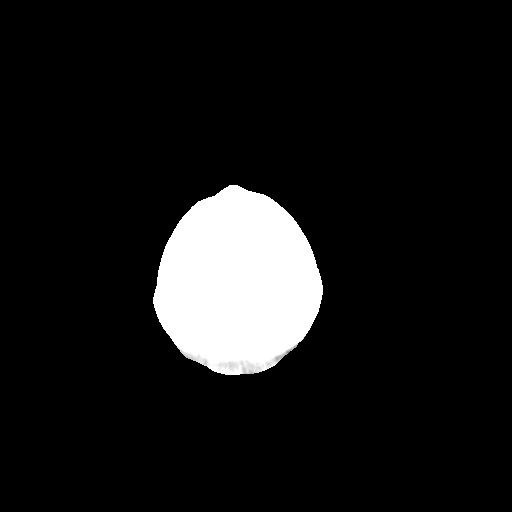

[16 of 29 positions shown; findings below may reference images not displayed]

FINDINGS: The brainstem and cerebellum are unremarkable. I think there is
debatable loss of gray-white differentiation in the insular region
on the right. There may be early change in the posterior frontal
region. Basal ganglia appear intact. No sign of hemorrhage,
hydrocephalus or extra-axial collection. No calvarial abnormality.
Visualized sinuses are clear.
IMPRESSION: Question loss of gray-white differentiation in the insula and
posterior frontal region on the right that could go along with the
earliest CT manifestation of acute infarction in the right middle
cerebral artery territory. No hemorrhage.

## 2015-01-24 MED ORDER — ASPIRIN 300 MG RE SUPP
300.0000 mg | Freq: Once | RECTAL | Status: AC
  Administered 2015-01-24: 300 mg via RECTAL
  Filled 2015-01-24: qty 1

## 2015-01-24 MED ORDER — ASPIRIN EC 81 MG PO TBEC
81.0000 mg | DELAYED_RELEASE_TABLET | Freq: Every day | ORAL | Status: DC
Start: 2015-01-24 — End: 2015-01-26
  Administered 2015-01-25 – 2015-01-26 (×2): 81 mg via ORAL
  Filled 2015-01-24 (×2): qty 1

## 2015-01-24 MED ORDER — STROKE: EARLY STAGES OF RECOVERY BOOK
Freq: Once | Status: AC
  Administered 2015-01-24: 21:00:00

## 2015-01-24 MED ORDER — SENNOSIDES-DOCUSATE SODIUM 8.6-50 MG PO TABS: 1.0000 | ORAL_TABLET | Freq: Every evening | ORAL | Status: DC | PRN

## 2015-01-24 MED ORDER — INSULIN ASPART 100 UNIT/ML ~~LOC~~ SOLN
5.0000 [IU] | Freq: Once | SUBCUTANEOUS | Status: AC
  Administered 2015-01-24: 5 [IU] via INTRAVENOUS
  Filled 2015-01-24: qty 1

## 2015-01-24 MED ORDER — SODIUM CHLORIDE 0.9 % IV BOLUS (SEPSIS)
500.0000 mL | Freq: Once | INTRAVENOUS | Status: AC
  Administered 2015-01-24: 500 mL via INTRAVENOUS

## 2015-01-24 MED ORDER — SODIUM CHLORIDE 0.9 % IV SOLN: INTRAVENOUS | Status: DC

## 2015-01-24 MED ORDER — INSULIN DETEMIR 100 UNIT/ML ~~LOC~~ SOLN
10.0000 [IU] | Freq: Every day | SUBCUTANEOUS | Status: DC
  Administered 2015-01-25 (×2): 10 [IU] via SUBCUTANEOUS
  Filled 2015-01-24 (×4): qty 0.1

## 2015-01-24 MED ORDER — INSULIN ASPART 100 UNIT/ML ~~LOC~~ SOLN: 10.0000 [IU] | Freq: Once | SUBCUTANEOUS | Status: DC

## 2015-01-24 MED ORDER — SODIUM CHLORIDE 0.9 % IV SOLN
INTRAVENOUS | Status: DC
  Administered 2015-01-24: 17:00:00 via INTRAVENOUS

## 2015-01-24 MED ORDER — ASPIRIN 81 MG PO CHEW: 324.0000 mg | CHEWABLE_TABLET | Freq: Once | ORAL | Status: DC

## 2015-01-24 MED ORDER — INSULIN ASPART 100 UNIT/ML ~~LOC~~ SOLN
0.0000 [IU] | SUBCUTANEOUS | Status: DC
  Administered 2015-01-24: 11 [IU] via SUBCUTANEOUS
  Administered 2015-01-25: 4 [IU] via SUBCUTANEOUS
  Administered 2015-01-25: 3 [IU] via SUBCUTANEOUS
  Administered 2015-01-25: 11 [IU] via SUBCUTANEOUS

## 2015-01-24 NOTE — ED Notes (Signed)
CBG of [id number]

## 2015-01-24 NOTE — ED Notes (Signed)
Patient is A&O on arrival.

## 2015-01-24 NOTE — ED Provider Notes (Signed)
TIME SEEN: 12:40 PM  CHIEF COMPLAINT: Left-sided weakness  HPI: Patient is a 54 y.o. M with history of ischemic cardiomyopathy status post pacemaker/AICD, diabetes, hyperlipidemia who presents to the emergency department with left-sided weakness. Family reports he was last seen normal yesterday evening. States that when he woke up this morning he fell because he states he "wasn't feeling right". Mother reports that when she got up at 10:30 AM she noticed that his left side was drooping. He is unable to tell me if he had this weakness when he woke up this morning. Denies a headache but did fall and hit his head. Is supposed to be taking Coumadin but states he has not been on it for 2 weeks. Denies fevers, cough, vomiting or diarrhea. Denies any recent alcohol or drug use but has a history of substance abuse. Denies prior history of stroke.  ROS: See HPI Constitutional: no fever  Eyes: no drainage  ENT: no runny nose   Cardiovascular:  no chest pain  Resp: no SOB  GI: no vomiting GU: no dysuria Integumentary: no rash  Allergy: no hives  Musculoskeletal: no leg swelling  Neurological:  slurred speech ROS otherwise negative  PAST MEDICAL HISTORY/PAST SURGICAL HISTORY:  Past Medical History  Diagnosis Date  . Chronic systolic heart failure     a) Mixed ICM/NICM b) RHC (05/2014): RA 2, RV 19/2/3, PA 22/14 (18), PCWP 6, Fick CO/CI: 5.2 / 2.7, PVR 2.3 WU, PA 60% and 64% c) ECHO (05/2014): EF 20-25%, diff HK, akinesis entireanteroseptal myocardium, triv AI, mod MR, LA mod/sev dilated  . Automatic implantable cardioverter-defibrillator in situ   . Heart murmur   . Sleep apnea     "cleared after T&A"  . Left ventricular noncompaction   . Ischemic cardiomyopathy     a) Coronary angiography (12/2008) at [facility]: Lmain: nl, LAD mid 100% stenosis with left to left and right-to-left collaterals to the distal LAD; Lcx: nl, RCA nl.    . Type II diabetes mellitus   . High cholesterol   . Pneumonia 1988   . Drug abuse and dependence     MEDICATIONS:  Prior to Admission medications   Medication Sig Start Date End Date Taking? Authorizing Provider  acetaminophen (TYLENOL) 500 MG tablet Take 1,000 mg by mouth every 6 (six) hours as needed for moderate pain.    Historical Provider, MD  atorvastatin (LIPITOR) 40 MG tablet Take 1 tablet (40 mg total) by mouth daily at 6 PM. 09/25/14   Amy D Clegg, NP  carvedilol (COREG) 3.125 MG tablet Take 1 tablet (3.125 mg total) by mouth 2 (two) times daily with a meal. 11/16/14   Larey Dresser, MD  digoxin (LANOXIN) 0.125 MG tablet Take 1 tablet (0.125 mg total) by mouth daily. 11/16/14   Larey Dresser, MD  furosemide (LASIX) 40 MG tablet Take 1 tablet (40 mg total) by mouth every other day. 12/01/14   Amy D Ninfa Meeker, NP  glucose blood (ONETOUCH VERIO) test strip Use as instructed 11/30/14   Golden Circle, FNP  Insulin Glargine (LANTUS) 100 UNIT/ML Solostar Pen Inject 32 Units into the skin daily at 10 pm. 11/30/14   Golden Circle, FNP  metFORMIN (GLUCOPHAGE) 500 MG tablet Take 1 tablet (500 mg total) by mouth 2 (two) times daily with a meal. Patient not taking: Reported on 12/03/2014 12/01/14   Golden Circle, FNP  QC PEN NEEDLES 31G X 6 MM MISC 1 each by Other route See admin instructions. Use  daily with insulin. 08/06/12   Historical Provider, MD  traMADol (ULTRAM) 50 MG tablet Take 1 tablet (50 mg total) by mouth every 12 (twelve) hours as needed for moderate pain. 09/25/14   Amy D Ninfa Meeker, NP  warfarin (COUMADIN) 5 MG tablet Take as directed by Coumadin clinic Patient not taking: Reported on 12/03/2014 11/20/14   Larey Dresser, MD  warfarin (COUMADIN) 7.5 MG tablet Take 7.5 mg by mouth daily.    Historical Provider, MD    ALLERGIES:  No Known Allergies  SOCIAL HISTORY:  History  Substance Use Topics  . Smoking status: Current Some Day Smoker -- 0.10 packs/day for 31 years    Types: Cigarettes  . Smokeless tobacco: Never Used  . Alcohol Use: 0.0  oz/week    0 Standard drinks or equivalent per week     Comment: 09/21/2014 "might have a drink q 6 /months, if that"    FAMILY HISTORY: Family History  Problem Relation Age of Onset  . Diabetes Mother   . Heart disease Mother   . Diabetes Father   . Prostate cancer Father   . Heart disease Father   . Diabetes Brother   . Diabetes Brother     EXAM: BP 108/77 mmHg  Pulse 91  Temp(Src) 98.1 F (36.7 C) (Oral)  Resp 18  SpO2 99% CONSTITUTIONAL: Alert and oriented and responds appropriately to questions. Well-appearing; well-nourished HEAD: Normocephalic EYES: Conjunctivae clear, PERRL ENT: normal nose; no rhinorrhea; moist mucous membranes; pharynx without lesions noted NECK: Supple, no meningismus, no LAD  CARD: RRR; S1 and S2 appreciated; no murmurs, no clicks, no rubs, no gallops RESP: Normal chest excursion without splinting or tachypnea; breath sounds clear and equal bilaterally; no wheezes, no rhonchi, no rales,  ABD/GI: Normal bowel sounds; non-distended; soft, non-tender, no rebound, no guarding BACK:  The back appears normal and is non-tender to palpation, there is no CVA tenderness EXT: Normal ROM in all joints; non-tender to palpation; no edema; normal capillary refill; no cyanosis    SKIN: Normal color for age and race; warm NEURO: Patient has left-sided weakness in his arm and leg with left sided facial droop, he also has mild dysarthria with no aphasia, reports normal sensation diffusely, no neglect, normal strength in his right upper and lower extremity, otherwise cranial nerves II through XII intact PSYCH: The patient's mood and manner are appropriate. Grooming and personal hygiene are appropriate.  MEDICAL DECISION MAKING: Patient here with left-sided weakness, left sided facial droop, dysarthria. Last seen normal was last night. Has a history of substance abuse but also history of hyperlipidemia, hypertension, diabetes. No history of A. fib. Is on Coumadin and had  a head injury this morning. We'll trend stroke workup and he is outside of TPA window. Patient is hyperglycemic. Will give IV fluids and IV insulin.  ED PROGRESS: Labs unremarkable other than hyperglycemia. Bicarbonate is normal, anion gap normal. Urine drug screen is positive for cocaine. INR is 1.02.   Linton Rump improved with IV fluids and IV insulin. Is now 367. Down from 564. CT head shows question of loss of gray-white differentiation in the insula and posterior frontal region on the right side that could suggest acute infarct in the right middle cerebral artery territory that would be consistent with his left-sided symptoms. We'll consult the hospitalist (PCP is Calone with [facility]) and neuro hospitalist.   4:30 PM  D/w Dr. Armida Sans who will see patient in consult and agrees with plan for admission. Given rectal ASA as  pt failed swallow screen.  4:45 PM  D/w Dr. [place] Pink for admission to telemetry, inpatient. We'll have him nothing by mouth at this time given he failed his swallow screen. Will start gentle IV hydration given his history of CHF.   EKG Interpretation  Date/Time:  Wednesday January 24 2015 12:30:46 EDT Ventricular Rate:  92 PR Interval:  196 QRS Duration: 83 QT Interval:  427 QTC Calculation: 528 R Axis:   -60 Text Interpretation:  Sinus rhythm Biatrial enlargement Left ventricular hypertrophy Inferior infarct, old Anterior infarct, old Lateral leads are also involved Prolonged QT interval No significant change since last tracing Confirmed by WARD,  DO, KRISTEN [phone]) on 01/24/2015 12:44:00 PM         [place], DO 01/24/15 1650

## 2015-01-24 NOTE — ED Notes (Signed)
Patient was lethagic woke up at 0800  And fell. Pain has left sided droop, left sided weakness with EMS. Patient has a hx of Drug use. P

## 2015-01-24 NOTE — H&P (Signed)
History and Physical  Dennis [name] ERX:[id number] DOB: 1961-02-20 DOA: 01/24/2015  Referring physician: Pryor Curia, ER physician PCP: Mauricio Po, FNP   Chief Complaint: Left-sided weakness  HPI: Dennis [name] is a 54 y.o. male  With past mental history of chronic systolic heart failure with 20% ejection fraction, left ventricular non-compaction on chronic anticoagulation, cocaine abuse, diabetes mellitus and most notably noncompliance who is been off of all medications for several weeks and was found by his mother today in his house with facial droop and inability to use his left side. Patient states he woke up feeling this way.  In the Emergency room, CT scan of the head noted possible acute infarction in the right middle cerebral artery territory. Lab work was concerning for significant hyperglycemia CBGs in the 500s, borderline hypotension with a systolic blood pressure in the 90s, and a nonexistent INR of 1.02. Patient's urine drug screen was positive for cocaine. Hospitalists and neurology called for further evaluation and admission.  Patient did fail bedside nursing swallow evaluation  Review of Systems:  Patient seen after transfer to floor Pt complains of fatigue and feeling hungry, he does feel mildly short of breath. He does also complain of inability to use his left arm and leg.  Pt denies any headaches, vision changes, dysphagia, chest pain, palpitations, cough, wheezing, abdominal pain, hematuria, dysuria, constipation, diarrhea.  Review of systems are otherwise negative  Past Medical History  Diagnosis Date  . Chronic systolic heart failure     a) Mixed ICM/NICM b) RHC (05/2014): RA 2, RV 19/2/3, PA 22/14 (18), PCWP 6, Fick CO/CI: 5.2 / 2.7, PVR 2.3 WU, PA 60% and 64% c) ECHO (05/2014): EF 20-25%, diff HK, akinesis entireanteroseptal myocardium, triv AI, mod MR, LA mod/sev dilated  . Automatic implantable cardioverter-defibrillator in situ   . Heart murmur   . Sleep apnea    "cleared after T&A"  . Left ventricular noncompaction   . Ischemic cardiomyopathy     a) Coronary angiography (12/2008) at [facility]: Lmain: nl, LAD mid 100% stenosis with left to left and right-to-left collaterals to the distal LAD; Lcx: nl, RCA nl.    . Type II diabetes mellitus   . High cholesterol   . Pneumonia 1988  . Drug abuse and dependence    Past Surgical History  Procedure Laterality Date  . Forearm fracture surgery Left 1980  . Cardiac defibrillator placement  03/2011  . Cholecystectomy  1994  . Right heart catheterization N/A 05/30/2014    Procedure: RIGHT HEART CATH;  Surgeon: Jolaine Artist, MD;  Location: [facility] CATH LAB;  Service: Cardiovascular;  Laterality: N/A;  . Fracture surgery    . Cardiac catheterization  05/2014  . Tonsillectomy and adenoidectomy  ~ 1998   Social History:  reports that he has been smoking Cigarettes.  He has a 3.1 pack-year smoking history. He has never used smokeless tobacco. He reports that he drinks alcohol occasionally, but nothing excessive. He says that he last used cocaine several weeks ago Patient lives at home with his other family & is able to participate in activities of daily living without assistance  No Known Allergies  Family History  Problem Relation Age of Onset  . Diabetes Mother   . Heart disease Mother   . Diabetes Father   . Prostate cancer Father   . Heart disease Father   . Diabetes Brother   . Diabetes Brother     Confirmed with family  Prior to Admission medications   Medication  Sig Start Date End Date Taking? Authorizing Provider  acetaminophen (TYLENOL) 500 MG tablet Take 1,000 mg by mouth every 6 (six) hours as needed for moderate pain.   Yes Historical Provider, MD  carvedilol (COREG) 3.125 MG tablet Take 1 tablet (3.125 mg total) by mouth 2 (two) times daily with a meal. 11/16/14  Yes Larey Dresser, MD  digoxin (LANOXIN) 0.125 MG tablet Take 1 tablet (0.125 mg total) by mouth daily. 11/16/14  Yes Larey Dresser,  MD  furosemide (LASIX) 40 MG tablet Take 1 tablet (40 mg total) by mouth every other day. 12/01/14  Yes Amy D Clegg, NP  Insulin Glargine (LANTUS) 100 UNIT/ML Solostar Pen Inject 32 Units into the skin daily at 10 pm. 11/30/14  Yes Golden Circle, FNP  traMADol (ULTRAM) 50 MG tablet Take 1 tablet (50 mg total) by mouth every 12 (twelve) hours as needed for moderate pain. 09/25/14  Yes Amy D Clegg, NP  warfarin (COUMADIN) 5 MG tablet Take 10 mg by mouth daily. Patient states he takes 10mg  every day per patient. Has not taken in two weeks per patient   Yes Historical Provider, MD  atorvastatin (LIPITOR) 40 MG tablet Take 1 tablet (40 mg total) by mouth daily at 6 PM. Patient not taking: Reported on 01/24/2015 09/25/14   Amy D Clegg, NP  glucose blood (ONETOUCH VERIO) test strip Use as instructed 11/30/14   Golden Circle, FNP  metFORMIN (GLUCOPHAGE) 500 MG tablet Take 1 tablet (500 mg total) by mouth 2 (two) times daily with a meal. Patient not taking: Reported on 12/03/2014 12/01/14   Golden Circle, FNP  QC PEN NEEDLES 31G X 6 MM MISC 1 each by Other route See admin instructions. Use daily with insulin. 08/06/12   Historical Provider, MD  warfarin (COUMADIN) 5 MG tablet Take as directed by Coumadin clinic Patient not taking: Reported on 12/03/2014 11/20/14   Larey Dresser, MD    Physical Exam: BP 99/76 mmHg  Pulse 89  Temp(Src) 98.4 F (36.9 C) (Oral)  Resp 14  SpO2 95%  General:  Somewhat somnolent, but able to interact appropriately, alert and oriented 3 Eyes: Sclera nonicteric, extraocular movements are intact ENT: Normocephalic, atraumatic coming mucous membranes are slightly dry Neck: No carotid bruits Cardiovascular: Regular rate and rhythm, T2-I7, 2/6 systolic ejection murmur, occasional ectopic beat Respiratory: Clear to auscultation bilaterally Abdomen: Soft, nontender, nondistended, positive bowel sounds Skin: No skin breaks, tears or lesions Musculoskeletal: No clubbing or  cyanosis or edema. Patient is a 5 minus/5 on right upper and lower extremity in terms of flexion, grip and extension. On the left upper extremity, he is a 3+/5 in terms of grip, flexion and extension. He is about a 4 minus/5 on the left lower extremity Psychiatric: Patient is appropriate, no evidence of psychoses Neurologic: Muscle cell exam detailed as above. Downgoing toes on right foot. Difficult to elicit on left. Cranial nerves II through XII are intact with the exception of a right sided facial droop, questionable mild tongue fasciculations of the left. Patient is such severe weakness, unable to check for dysmetria on the left           Labs on Admission:  Basic Metabolic Panel:  Recent Labs Lab 01/24/15 1325 01/24/15 1337  NA 131* 132*  K 4.8 4.9  CL 96 95*  CO2 27  --   GLUCOSE 536* 564*  BUN 16 19  CREATININE 1.07 1.00  CALCIUM 9.2  --  Liver Function Tests:  Recent Labs Lab 01/24/15 1325  AST 18  ALT 18  ALKPHOS 99  BILITOT 1.0  PROT 6.3  ALBUMIN 3.3*   No results for input(s): LIPASE, AMYLASE in the last 168 hours. No results for input(s): AMMONIA in the last 168 hours. CBC:  Recent Labs Lab 01/24/15 1337 01/24/15 1359  WBC  --  9.4  NEUTROABS  --  6.8  HGB 17.7* 15.5  HCT 52.0 46.1  MCV  --  92.2  PLT  --  199   Cardiac Enzymes: No results for input(s): CKTOTAL, CKMB, CKMBINDEX, TROPONINI in the last 168 hours.  BNP (last 3 results)  Recent Labs  12/03/14 0351  BNP 321.1*    ProBNP (last 3 results)  Recent Labs  08/13/14 1850 09/21/14 1928 10/02/14 1033  PROBNP 1525.0* 1870.0* 1085.0*    CBG:  Recent Labs Lab 01/24/15 1330 01/24/15 1605  GLUCAP 448* 367*    Radiological Exams on Admission: Ct Head Wo Contrast  01/24/2015   CLINICAL DATA:  Awoke with left-sided weakness and difficulty swallowing.  EXAM: CT HEAD WITHOUT CONTRAST  TECHNIQUE: Contiguous axial images were obtained from the base of the skull through the vertex  without intravenous contrast.  COMPARISON:  None.  FINDINGS: The brainstem and cerebellum are unremarkable. I think there is debatable loss of gray-white differentiation in the insular region on the right. There may be early change in the posterior frontal region. Basal ganglia appear intact. No sign of hemorrhage, hydrocephalus or extra-axial collection. No calvarial abnormality. Visualized sinuses are clear.  IMPRESSION: Question loss of gray-white differentiation in the insula and posterior frontal region on the right that could go along with the earliest CT manifestation of acute infarction in the right middle cerebral artery territory. No hemorrhage.   Electronically Signed   By: Nelson Chimes M.D.   On: 01/24/2015 16:08    EKG: Independently reviewed. Sinus rhythm with biatrial marginal, evidence of old infarct, left ventricular hypertrophy, questionable prolonged QT at 427  Assessment/Plan Present on Admission:  . chronic systolic heart failure with ejection fraction of 20%/left ventricular non-compaction on chronic anticoagulation: Awaiting BNP. No ACE inhibitor or beta blocker at this time due to hypotension. Patient has been noncompliant regardless. In addition has been noncompliant with taking anticoagulation medication. Awaiting BNP, this may be kept in check despite patient's noncompliance because of diuresis brought on by hyperglycemia . In the meantime given hypoglycemia, will treat with IV fluids  . Acute ischemic stroke: High suspicion of cardiac embolic stroke, especially being off of Coumadin. Unable to get MRI because of history of pacemaker, but follow-up CT scan tomorrow. Neurology following. Check full stroke workup including Dopplers, echo. Failed swallow eval, awaiting speech therapy  . Cocaine abuse: Counseled. Patient states he's not had any cocaine for several weeks  . Tobacco abuse: Counseled. Patient declines nicotine patch  . Essential hypertension: Holding antihypertensive  secondary to hypotension  . Hypotension, unclear etiology, likely from volume depletion brought on by hyperglycemia  . Hyperlipidemia: Noncompliant with statin, will check lipid panel  . Hemiplegia affecting left nondominant side: Patient likely will have serious deficits from this point on.  . Diabetes mellitus type 2, uncontrolled, with complications: Patient with significant hyperglycemia, no DKA evident. Will give low dose plus every 4 hours sliding scale resistant. Check A1c   Consultants: Neurology  Code Status: Full code  Family Communication: Spoke with mother and brother at the bedside  Disposition Plan: Likely here for several days, most  likely will need short-term skilled nursing  Time spent: 45 minutes  [facility] Pager [phone]

## 2015-01-24 NOTE — Consult Note (Signed)
Referring Physician: Dr. Leonides Schanz    Chief Complaint: left hemiparesis, left face weakness, dysarthria.  HPI:                                                                                                                                         Dennis [name] is an 54 y.o. male with a past medical history significant for hypercholesterolemia, DM, CAD, chronic systolic congestive heart failure with last ECHO 12/15 EF 20%, left ventricular non-compaction on coumadin, status post pacemaker/AICD placement, substance abuse, OSA, brought in by family member for further evaluation of the above stated symptoms. Patient was reportedly able to walk last night, but he said that he woke up this morning feeling no well and around 8 am fell to the floor and had to crawl to a chair. According to his family member, by 8:30 this morning she noticed that he was slurring his words, had left face droop, and was dragging his left leg and complaining that he was not able to walk. He is on chronic coumadin but hasn't been taking it for 2 weeks because he can not afford it. CT brain was personality reviewed and showed possible loss of of gray-white differentiation in the insula and posterior right frontal region concerning for acute ischemia.  Denies HA, vertigo, double vision, numbness-tingling, language or vision impairment. Having trouble handling his own secretions. Serologies significant for Na 131, glucose 536. UDS positive for cocaine. ETOH<5. INR 1.02  Date last known well: 01/24/15 Time last known well: unclear tPA Given: no, out of the window.   Past Medical History  Diagnosis Date  . Chronic systolic heart failure     a) Mixed ICM/NICM b) RHC (05/2014): RA 2, RV 19/2/3, PA 22/14 (18), PCWP 6, Fick CO/CI: 5.2 / 2.7, PVR 2.3 WU, PA 60% and 64% c) ECHO (05/2014): EF 20-25%, diff HK, akinesis entireanteroseptal myocardium, triv AI, mod MR, LA mod/sev dilated  . Automatic implantable cardioverter-defibrillator in situ    . Heart murmur   . Sleep apnea     "cleared after T&A"  . Left ventricular noncompaction   . Ischemic cardiomyopathy     a) Coronary angiography (12/2008) at [facility]: Lmain: nl, LAD mid 100% stenosis with left to left and right-to-left collaterals to the distal LAD; Lcx: nl, RCA nl.    . Type II diabetes mellitus   . High cholesterol   . Pneumonia 1988  . Drug abuse and dependence     Past Surgical History  Procedure Laterality Date  . Forearm fracture surgery Left 1980  . Cardiac defibrillator placement  03/2011  . Cholecystectomy  1994  . Right heart catheterization N/A 05/30/2014    Procedure: RIGHT HEART CATH;  Surgeon: Jolaine Artist, MD;  Location: [facility] CATH LAB;  Service: Cardiovascular;  Laterality: N/A;  . Fracture surgery    . Cardiac catheterization  05/2014  . Tonsillectomy and adenoidectomy  ~  1998    Family History  Problem Relation Age of Onset  . Diabetes Mother   . Heart disease Mother   . Diabetes Father   . Prostate cancer Father   . Heart disease Father   . Diabetes Brother   . Diabetes Brother    Social History:  reports that he has been smoking Cigarettes.  He has a 3.1 pack-year smoking history. He has never used smokeless tobacco. He reports that he drinks alcohol. He reports that he does not use illicit drugs. Family history: no epilepsy, brain tumors, or brain aneurysm.  Allergies: No Known Allergies  Medications:                                                                                                                           I have reviewed the patient's current medications.  ROS:                                                                                                                                       History obtained from the patient, family, and chart review  General ROS: negative for - chills, fatigue, fever, night sweats, weight gain or weight loss Psychological ROS: negative for - behavioral disorder, hallucinations,  memory difficulties, mood swings or suicidal ideation Ophthalmic ROS: negative for - blurry vision, double vision, eye pain or loss of vision ENT ROS: negative for - epistaxis, nasal discharge, oral lesions, sore throat, tinnitus or vertigo Allergy and Immunology ROS: negative for - hives or itchy/watery eyes Hematological and Lymphatic ROS: negative for - bleeding problems, bruising or swollen lymph nodes Endocrine ROS: negative for - galactorrhea, hair pattern changes, polydipsia/polyuria or temperature intolerance Respiratory ROS: negative for - cough, hemoptysis, shortness of breath or wheezing Cardiovascular ROS: negative for - chest pain, dyspnea on exertion, edema or irregular heartbeat Gastrointestinal ROS: negative for - abdominal pain, diarrhea, hematemesis, nausea/vomiting or stool incontinence Genito-Urinary ROS: negative for - dysuria, hematuria, incontinence or urinary frequency/urgency Musculoskeletal ROS: negative for - joint swelling Neurological ROS: as noted in HPI Dermatological ROS: negative for rash and skin lesion changes     Physical exam: pleasant male in no apparent distress. Blood pressure 109/79, pulse 95, temperature 98.1 F (36.7 C), temperature source Oral, resp. rate 14, SpO2 100 %. Head: normocephalic. Neck: supple, no bruits, no JVD. Cardiac: no murmurs. Lungs: clear. Abdomen: soft, no  tender, no mass. Extremities: no edema. Skin: no rash Neurologic Examination:                                                                                                      General: Mental Status: Alert, oriented, thought content appropriate. Mild dysarthria without evidence of aphasia.  Able to follow 3 step commands without difficulty. Cranial Nerves: II: Discs flat bilaterally; Visual fields grossly normal, pupils equal, round, reactive to light and accommodation III,IV, VI: ptosis not present, extra-ocular motions intact bilaterally V,VII: smile asymmetric  due to left face weakness , facial light touch sensation normal bilaterally VIII: hearing normal bilaterally IX,X: uvula rises symmetrically XI: bilateral shoulder shrug XII: tongue slightly deviated to the left without atrophy or fasciculations Motor: Significant for left hemiparesis, arm>leg Tone and bulk:normal tone throughout; no atrophy noted Sensory: Pinprick and light touch intact throughout, bilaterally Deep Tendon Reflexes:  2 all over Plantars: Right: downgoing   Left: mute Cerebellar: normal finger-to-nose and normal heel-to-shin test in the right side.  Can not perform FTN in the left due to weakness but HKS is normal in the left. Gait:  No tested due to safety reasons.    Results for orders placed or performed during the hospital encounter of 01/24/15 (from the past 48 hour(s))  Urine Drug Screen     Status: Abnormal   Collection Time: 01/24/15 12:54 PM  Result Value Ref Range   Opiates NONE DETECTED NONE DETECTED   Cocaine POSITIVE (A) NONE DETECTED   Benzodiazepines NONE DETECTED NONE DETECTED   Amphetamines NONE DETECTED NONE DETECTED   Tetrahydrocannabinol NONE DETECTED NONE DETECTED   Barbiturates NONE DETECTED NONE DETECTED    Comment:        DRUG SCREEN FOR MEDICAL PURPOSES ONLY.  IF CONFIRMATION IS NEEDED FOR ANY PURPOSE, NOTIFY LAB WITHIN 5 DAYS.        LOWEST DETECTABLE LIMITS FOR URINE DRUG SCREEN Drug Class       Cutoff (ng/mL) Amphetamine      1000 Barbiturate      200 Benzodiazepine   960 Tricyclics       454 Opiates          300 Cocaine          300 THC              50   Urinalysis, Routine w reflex microscopic     Status: Abnormal   Collection Time: 01/24/15 12:54 PM  Result Value Ref Range   Color, Urine YELLOW YELLOW   APPearance CLEAR CLEAR   Specific Gravity, Urine 1.041 (H) 1.005 - 1.030   pH 5.0 5.0 - 8.0   Glucose, UA >1000 (A) NEGATIVE mg/dL   Hgb urine dipstick NEGATIVE NEGATIVE   Bilirubin Urine NEGATIVE NEGATIVE    Ketones, ur 15 (A) NEGATIVE mg/dL   Protein, ur NEGATIVE NEGATIVE mg/dL   Urobilinogen, UA 0.2 0.0 - 1.0 mg/dL   Nitrite NEGATIVE NEGATIVE   Leukocytes, UA NEGATIVE NEGATIVE  Urine microscopic-add on     Status: None   Collection Time: 01/24/15  12:54 PM  Result Value Ref Range   Squamous Epithelial / LPF RARE RARE   WBC, UA 0-2 <3 WBC/hpf   RBC / HPF 0-2 <3 RBC/hpf   Bacteria, UA RARE RARE  Ethanol     Status: None   Collection Time: 01/24/15  1:25 PM  Result Value Ref Range   Alcohol, Ethyl (B) <5 0 - 9 mg/dL    Comment:        LOWEST DETECTABLE LIMIT FOR SERUM ALCOHOL IS 11 mg/dL FOR MEDICAL PURPOSES ONLY   Protime-INR     Status: None   Collection Time: 01/24/15  1:25 PM  Result Value Ref Range   Prothrombin Time 13.5 11.6 - 15.2 seconds   INR 1.02 0.00 - 1.49  APTT     Status: None   Collection Time: 01/24/15  1:25 PM  Result Value Ref Range   aPTT 29 24 - 37 seconds  Comprehensive metabolic panel     Status: Abnormal   Collection Time: 01/24/15  1:25 PM  Result Value Ref Range   Sodium 131 (L) 135 - 145 mmol/L   Potassium 4.8 3.5 - 5.1 mmol/L   Chloride 96 96 - 112 mmol/L   CO2 27 19 - 32 mmol/L   Glucose, Bld 536 (H) 70 - 99 mg/dL   BUN 16 6 - 23 mg/dL   Creatinine, Ser 1.07 0.50 - 1.35 mg/dL   Calcium 9.2 8.4 - 10.5 mg/dL   Total Protein 6.3 6.0 - 8.3 g/dL   Albumin 3.3 (L) 3.5 - 5.2 g/dL   AST 18 0 - 37 U/L   ALT 18 0 - 53 U/L   Alkaline Phosphatase 99 39 - 117 U/L   Total Bilirubin 1.0 0.3 - 1.2 mg/dL   GFR calc non Af Amer 78 (L) >90 mL/min   GFR calc Af Amer >90 >90 mL/min    Comment: (NOTE) The eGFR has been calculated using the CKD EPI equation. This calculation has not been validated in all clinical situations. eGFR's persistently <90 mL/min signify possible Chronic Kidney Disease.    Anion gap 8 5 - 15  CBG monitoring, ED     Status: Abnormal   Collection Time: 01/24/15  1:30 PM  Result Value Ref Range   Glucose-Capillary 448 (H) 70 - 99 mg/dL   I-Stat Troponin, ED (not at [facility])     Status: None   Collection Time: 01/24/15  1:35 PM  Result Value Ref Range   Troponin i, poc 0.00 0.00 - 0.08 ng/mL   Comment 3            Comment: Due to the release kinetics of cTnI, a negative result within the first hours of the onset of symptoms does not rule out myocardial infarction with certainty. If myocardial infarction is still suspected, repeat the test at appropriate intervals.   I-Stat Chem 8, ED     Status: Abnormal   Collection Time: 01/24/15  1:37 PM  Result Value Ref Range   Sodium 132 (L) 135 - 145 mmol/L   Potassium 4.9 3.5 - 5.1 mmol/L   Chloride 95 (L) 96 - 112 mmol/L   BUN 19 6 - 23 mg/dL   Creatinine, Ser 1.00 0.50 - 1.35 mg/dL   Glucose, Bld 564 (HH) 70 - 99 mg/dL   Calcium, Ion 1.19 1.12 - 1.23 mmol/L   TCO2 23 0 - 100 mmol/L   Hemoglobin 17.7 (H) 13.0 - 17.0 g/dL   HCT 52.0 39.0 - 52.0 %  Comment NOTIFIED PHYSICIAN   CBC with Differential     Status: None   Collection Time: 01/24/15  1:59 PM  Result Value Ref Range   WBC 9.4 4.0 - 10.5 K/uL   RBC 5.00 4.22 - 5.81 MIL/uL   Hemoglobin 15.5 13.0 - 17.0 g/dL   HCT 46.1 39.0 - 52.0 %   MCV 92.2 78.0 - 100.0 fL   MCH 31.0 26.0 - 34.0 pg   MCHC 33.6 30.0 - 36.0 g/dL   RDW 12.2 11.5 - 15.5 %   Platelets 199 150 - 400 K/uL   Neutrophils Relative % 73 43 - 77 %   Neutro Abs 6.8 1.7 - 7.7 K/uL   Lymphocytes Relative 23 12 - 46 %   Lymphs Abs 2.2 0.7 - 4.0 K/uL   Monocytes Relative 4 3 - 12 %   Monocytes Absolute 0.4 0.1 - 1.0 K/uL   Eosinophils Relative 0 0 - 5 %   Eosinophils Absolute 0.0 0.0 - 0.7 K/uL   Basophils Relative 0 0 - 1 %   Basophils Absolute 0.0 0.0 - 0.1 K/uL  CBG monitoring, ED     Status: Abnormal   Collection Time: 01/24/15  4:05 PM  Result Value Ref Range   Glucose-Capillary 367 (H) 70 - 99 mg/dL   Ct Head Wo Contrast  01/24/2015   CLINICAL DATA:  Awoke with left-sided weakness and difficulty swallowing.  EXAM: CT HEAD WITHOUT CONTRAST   TECHNIQUE: Contiguous axial images were obtained from the base of the skull through the vertex without intravenous contrast.  COMPARISON:  None.  FINDINGS: The brainstem and cerebellum are unremarkable. I think there is debatable loss of gray-white differentiation in the insular region on the right. There may be early change in the posterior frontal region. Basal ganglia appear intact. No sign of hemorrhage, hydrocephalus or extra-axial collection. No calvarial abnormality. Visualized sinuses are clear.  IMPRESSION: Question loss of gray-white differentiation in the insula and posterior frontal region on the right that could go along with the earliest CT manifestation of acute infarction in the right middle cerebral artery territory. No hemorrhage.   Electronically Signed   By: Nelson Chimes M.D.   On: 01/24/2015 16:08    Assessment: 54 y.o. male with new onset left hemiparesis arm>leg, left face weakness, and mild dysarthria, with CT brain showing findings concerning for early signs right MCA distribution infarct. Patient is out of the window for thrombolysis. UDS positive for cocaine. Patient on chronic coumadin apparently for suspected left ventricular non-compaction but has been off x 2 weeks, thus a cardio embolic stroke likely. INR is sub-therapeutic but will not resume coumadin until we get a better idea of the stroke size and consequent risk of hemorrhagic transformation. Unfortunately, can not have MRI due to pacemaker/AICD but will get follow up CT brain tomorrow. Complete stroke work up. Aspirin after passing swallowing evaluation.   Stroke Risk Factors -  hypercholesterolemia, DM, CAD, chronic systolic congestive heart failure EF 20%, substance abuse.  Plan: 1. HgbA1c, fasting lipid panel 2. CT/CTA of the brain 3. Echocardiogram 4. Carotid dopplers 5. Prophylactic therapy-aspirin 6. Risk factor modification 7. Telemetry monitoring 8. Frequent neuro checks 9. PT/OT SLP 10. NPO until  swallowing evaluation by RN stroke.  Dorian Pod, MD [facility] [phone]  01/24/2015, 4:46 PM

## 2015-01-24 NOTE — Progress Notes (Signed)
Unit CM UR Completed by [facility] ED CM  W. Rogers RN  

## 2015-01-24 NOTE — ED Notes (Signed)
Pt sts he hasn't had his coumadin in 2 weeks.

## 2015-01-24 NOTE — ED Notes (Signed)
Dr. Ward at the bedside.  

## 2015-01-24 NOTE — ED Notes (Signed)
Pt undressed, in gown, on monitor, continuous pulse oximetry and blood pressure cuff 

## 2015-01-24 NOTE — Progress Notes (Signed)
Pt arrived to 4N16. Alert and oriented x 4. Pt placed on tele. Call bell and phone within reach. Bed alarm on. Pt denies any pain. Will continue to monitor.

## 2015-01-24 NOTE — ED Notes (Signed)
Pt sts he needed to use the urinal and assisted to the side of the bed. Pt did not follow commands and proceeded to stand up and use the urinal. Family member in the room had to assist this RN in helping hold the pt up to keep him from falling.

## 2015-01-25 ENCOUNTER — Inpatient Hospital Stay (HOSPITAL_COMMUNITY): Payer: Medicare Other

## 2015-01-25 ENCOUNTER — Encounter (HOSPITAL_COMMUNITY): Payer: Self-pay

## 2015-01-25 DIAGNOSIS — Z720 Tobacco use: Secondary | ICD-10-CM

## 2015-01-25 DIAGNOSIS — I959 Hypotension, unspecified: Secondary | ICD-10-CM

## 2015-01-25 DIAGNOSIS — I428 Other cardiomyopathies: Secondary | ICD-10-CM | POA: Insufficient documentation

## 2015-01-25 DIAGNOSIS — E785 Hyperlipidemia, unspecified: Secondary | ICD-10-CM

## 2015-01-25 DIAGNOSIS — I63411 Cerebral infarction due to embolism of right middle cerebral artery: Secondary | ICD-10-CM | POA: Insufficient documentation

## 2015-01-25 DIAGNOSIS — R0989 Other specified symptoms and signs involving the circulatory and respiratory systems: Secondary | ICD-10-CM | POA: Insufficient documentation

## 2015-01-25 DIAGNOSIS — M6289 Other specified disorders of muscle: Secondary | ICD-10-CM

## 2015-01-25 DIAGNOSIS — R531 Weakness: Secondary | ICD-10-CM | POA: Insufficient documentation

## 2015-01-25 DIAGNOSIS — Z9119 Patient's noncompliance with other medical treatment and regimen: Secondary | ICD-10-CM

## 2015-01-25 DIAGNOSIS — R9430 Abnormal result of cardiovascular function study, unspecified: Secondary | ICD-10-CM

## 2015-01-25 LAB — BASIC METABOLIC PANEL
Anion gap: 11 (ref 5–15)
BUN: 16 mg/dL (ref 6–23)
CO2: 25 mmol/L (ref 19–32)
Calcium: 9.5 mg/dL (ref 8.4–10.5)
Chloride: 104 mmol/L (ref 96–112)
Creatinine, Ser: 0.88 mg/dL (ref 0.50–1.35)
GFR calc Af Amer: >90 mL/min (ref >90)
GFR calc non Af Amer: >90 mL/min (ref >90)
Glucose, Bld: 169 mg/dL — ABNORMAL HIGH (ref 70–99)
Potassium: 4.1 mmol/L (ref 3.5–5.1)
Sodium: 140 mmol/L (ref 135–145)

## 2015-01-25 LAB — GLUCOSE, CAPILLARY
Glucose-Capillary: 62 mg/dL — ABNORMAL LOW (ref 70–99)
Glucose-Capillary: 178 mg/dL — ABNORMAL HIGH (ref 70–99)
Glucose-Capillary: 145 mg/dL — ABNORMAL HIGH (ref 70–99)
Glucose-Capillary: 280 mg/dL — ABNORMAL HIGH (ref 70–99)
Glucose-Capillary: 173 mg/dL — ABNORMAL HIGH (ref 70–99)
Glucose-Capillary: 171 mg/dL — ABNORMAL HIGH (ref 70–99)

## 2015-01-25 LAB — LIPID PANEL
Cholesterol: 197 mg/dL (ref 0–200)
HDL: 73 mg/dL (ref >39)
LDL Cholesterol: 116 mg/dL — ABNORMAL HIGH (ref 0–99)
Total CHOL/HDL Ratio: 2.7 RATIO
Triglycerides: 42 mg/dL (ref <150)
VLDL: 8 mg/dL (ref 0–40)

## 2015-01-25 IMAGING — CT CT ANGIO NECK
1 of 12 series · 1 of 35 positions shown · IV contrast (Iohexol (Omnipaque 350))
Comparison: Head CT without contrast [DATE].

CLINICAL DATA: 53-year-old male stroke patient with left side
weakness and difficulty swallowing. Not a candidate for
thrombolysis. Cannot have MRI. Initial encounter.



[Series 300: locator · axial · 0.49mm/px · 1 of 1 slices shown]
[im 1/1  soft-tissue]
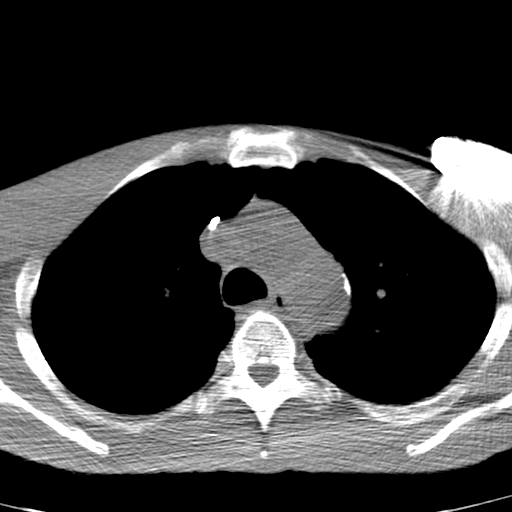

[1 of 35 positions shown; findings below may reference images not displayed]

FINDINGS: CT HEAD

Brain: Evolved right MCA anterior division infarct with progressive
gray and white matter hypodensity corresponding to the insula and
anterior operculum, in the area questioned by CT yesterday. No acute
intracranial hemorrhage identified. No associated mass effect. No
ventriculomegaly. Stable gray-white matter differentiation
elsewhere.

Calvarium and skull base: Negative.

Paranasal sinuses: Visualized paranasal sinuses and mastoids are
clear.

Orbits: Negative.

CTA NECK

Other neck: Left chest cardiac pacemaker with mild streak artifact.
Paraseptal and centrilobular emphysema. No superior mediastinal
lymphadenopathy.

Thyroid, larynx, pharynx, parapharyngeal spaces, retropharyngeal
space and sublingual space are within normal limits.

Diffuse increased attenuation of subcutaneous fat and superficial
soft tissues such that there is portal in a shin of the
submandibular and parotid glands, as well as the muscle layers. No
cervical lymphadenopathy identified.

Skeleton: Degenerative changes in the cervical spine. No acute
osseous abnormality identified.

Aortic arch: 3 vessel arch configuration. Mild distal arch calcified
plaque. No great vessel origin stenosis.

Right carotid system: Negative right CCA. Widely patent right
carotid bifurcation with minimal calcified plaque (series 504, image
99). Negative cervical right ICA.

Left carotid system: Negative left CCA. Negative left carotid
bifurcation. Negative cervical left ICA.

Vertebral arteries:

No proximal right subclavian artery stenosis. Paravertebral venous
contrast reflux obscures the proximal right vertebral artery and its
origin. The right V2 segment is normal. Negative right vertebral
artery at the skullbase.

No proximal left subclavian artery stenosis. Non dominant appearing
left vertebral artery has a normal origin, and is normal to the
skullbase.

CTA HEAD

Posterior circulation: Mildly dominant distal right vertebral
artery. Normal PICA origins. Normal vertebrobasilar junction. No
basilar artery stenosis. Normal SCA and left PCA origins. Fetal type
right PCA origin. Left posterior communicating artery is diminutive
or absent. Bilateral PCA branches are within normal limits.

Anterior circulation: Negative left ICA siphon. Normal left
ophthalmic artery origin. Negative right ICA siphon. Normal right
ophthalmic and posterior communicating artery origins. Normal
carotid termini. Normal MCA and ACA origins. Diminutive or absent
anterior communicating artery. Bilateral ACA branches are within
normal limits. Left MCA branches are within normal limits.

Right MCA origin is normal and M1 segment is patent. Right MCA
bifurcation is patent. There is then filling defect and irregularity
of proximal right MCA M2 branches, with subtotal loss of enhancement
of the proximal anterior sylvian division best seen on series 506,
image 16. Despite this, there is preserved more distal right M2 and
M3 enhancement. No major right MCA branch occlusion is identified.

Venous sinuses: Negative.

Anatomic variants: Mildly dominant right vertebral artery. Fetal
type right PCA origin.

Delayed phase: No abnormal enhancement identified.
IMPRESSION: 1. Evolving anterior division right MCA infarct. No hemorrhage or
mass effect.
2. Associated filling defect just beyond the right MCA bifurcation
affecting the proximal right M2 branches compatible with
thromboembolic disease. Despite this, no major right MCA branch
occlusion is identified.
3. Otherwise negative intracranial CTA.
4. Minimal atherosclerosis in the neck and aortic arch.
5. Emphysema.

## 2015-01-25 MED ORDER — INSULIN ASPART 100 UNIT/ML ~~LOC~~ SOLN
0.0000 [IU] | Freq: Three times a day (TID) | SUBCUTANEOUS | Status: DC
  Administered 2015-01-25: 4 [IU] via SUBCUTANEOUS
  Administered 2015-01-26: 7 [IU] via SUBCUTANEOUS
  Administered 2015-01-26: 15 [IU] via SUBCUTANEOUS

## 2015-01-25 MED ORDER — WARFARIN SODIUM 5 MG PO TABS
10.0000 mg | ORAL_TABLET | Freq: Once | ORAL | Status: AC
  Administered 2015-01-25: 10 mg via ORAL
  Filled 2015-01-25: qty 2

## 2015-01-25 MED ORDER — HEPARIN SODIUM (PORCINE) 5000 UNIT/ML IJ SOLN
5000.0000 [IU] | Freq: Three times a day (TID) | INTRAMUSCULAR | Status: DC
  Administered 2015-01-25 – 2015-01-26 (×3): 5000 [IU] via SUBCUTANEOUS
  Filled 2015-01-25 (×4): qty 1

## 2015-01-25 MED ORDER — FUROSEMIDE 10 MG/ML IJ SOLN
20.0000 mg | Freq: Once | INTRAMUSCULAR | Status: AC
  Administered 2015-01-25: 20 mg via INTRAVENOUS
  Filled 2015-01-25: qty 4

## 2015-01-25 MED ORDER — WHITE PETROLATUM GEL
Status: AC
  Administered 2015-01-25: 0.2
  Filled 2015-01-25: qty 1

## 2015-01-25 MED ORDER — SODIUM CHLORIDE 0.9 % IV SOLN: INTRAVENOUS | Status: DC

## 2015-01-25 MED ORDER — ATORVASTATIN CALCIUM 10 MG PO TABS
20.0000 mg | ORAL_TABLET | Freq: Every day | ORAL | Status: DC
  Administered 2015-01-25 – 2015-01-26 (×2): 20 mg via ORAL
  Filled 2015-01-25 (×2): qty 2

## 2015-01-25 MED ORDER — DEXTROSE 50 % IV SOLN
INTRAVENOUS | Status: AC
  Administered 2015-01-25: 25 mL via INTRAVENOUS
  Filled 2015-01-25: qty 50

## 2015-01-25 MED ORDER — ALBUTEROL SULFATE (2.5 MG/3ML) 0.083% IN NEBU
2.5000 mg | INHALATION_SOLUTION | RESPIRATORY_TRACT | Status: DC | PRN
  Administered 2015-01-25: 2.5 mg via RESPIRATORY_TRACT
  Filled 2015-01-25: qty 3

## 2015-01-25 MED ORDER — INSULIN ASPART 100 UNIT/ML ~~LOC~~ SOLN: 0.0000 [IU] | Freq: Three times a day (TID) | SUBCUTANEOUS | Status: DC

## 2015-01-25 MED ORDER — DEXTROSE 50 % IV SOLN
25.0000 mL | Freq: Once | INTRAVENOUS | Status: AC
  Administered 2015-01-25: 25 mL via INTRAVENOUS

## 2015-01-25 MED ORDER — WARFARIN - PHARMACIST DOSING INPATIENT: Freq: Every day | Status: DC

## 2015-01-25 MED ORDER — IOHEXOL 350 MG/ML SOLN
80.0000 mL | Freq: Once | INTRAVENOUS | Status: AC | PRN
  Administered 2015-01-25: 80 mL via INTRAVENOUS

## 2015-01-25 MED ORDER — RESOURCE THICKENUP CLEAR PO POWD
ORAL | Status: DC | PRN
  Filled 2015-01-25: qty 125

## 2015-01-25 NOTE — Evaluation (Signed)
Clinical/Bedside Swallow Evaluation Patient Details  Name: Dennis [name] MRN: [id number] Date of Birth: 1960/11/11  Today's Date: 01/25/2015 Time: SLP Start Time (ACUTE ONLY): 4332 SLP Stop Time (ACUTE ONLY): 0945 SLP Time Calculation (min) (ACUTE ONLY): 24 min  Past Medical History:  Past Medical History  Diagnosis Date  . Chronic systolic heart failure     a) Mixed ICM/NICM b) RHC (05/2014): RA 2, RV 19/2/3, PA 22/14 (18), PCWP 6, Fick CO/CI: 5.2 / 2.7, PVR 2.3 WU, PA 60% and 64% c) ECHO (05/2014): EF 20-25%, diff HK, akinesis entireanteroseptal myocardium, triv AI, mod MR, LA mod/sev dilated  . Automatic implantable cardioverter-defibrillator in situ   . Heart murmur   . Sleep apnea     "cleared after T&A"  . Left ventricular noncompaction   . Ischemic cardiomyopathy     a) Coronary angiography (12/2008) at [facility]: Lmain: nl, LAD mid 100% stenosis with left to left and right-to-left collaterals to the distal LAD; Lcx: nl, RCA nl.    . Type II diabetes mellitus   . High cholesterol   . Pneumonia 1988  . Drug abuse and dependence    Past Surgical History:  Past Surgical History  Procedure Laterality Date  . Forearm fracture surgery Left 1980  . Cardiac defibrillator placement  03/2011  . Cholecystectomy  1994  . Right heart catheterization N/A 05/30/2014    Procedure: RIGHT HEART CATH;  Surgeon: Jolaine Artist, MD;  Location: [facility] CATH LAB;  Service: Cardiovascular;  Laterality: N/A;  . Fracture surgery    . Cardiac catheterization  05/2014  . Tonsillectomy and adenoidectomy  ~ 19915   HPI:  54 year old male with PMH of heart failure, cocaine abuse, DM, PNA, tonsillectomy, adenoidectomy, tobacco/drug use, medication noncompliance found with facial droop and inability to use left side. CT with question of right MCA territory infarct. Urine drug screen positive for cocaine.    Assessment / Plan / Recommendation Clinical Impression  Patient presents with a mild oropharyngeal dysphagia  characterized by mild labial and buccal weakness resulting in suspected delay in swallow initiation with delayed s/s of decreased airway protection following intake of thin liquids. Patient pleasant but impulsive, with SLP providing moderate verbal cueing for small single sips and education regarding rationale for additional aspiration precautions. Airway protection appearing to be improved with trials of nectar thick liquids. Will intiate conservative diet. Hopeful for rapid recovery in function. Will f/u for trials of upgraded liquid textures at bedside and need for instrumental study.    Aspiration Risk  Moderate    Diet Recommendation Dysphagia 3 (Mechanical Soft);Nectar-thick liquid   Liquid Administration via: Cup;No straw Medication Administration: Whole meds with liquid Supervision: Patient able to self feed;Intermittent supervision to cue for compensatory strategies Compensations: Slow rate;Small sips/bites;Check for pocketing Postural Changes and/or Swallow Maneuvers: Seated upright 90 degrees    Other  Recommendations Oral Care Recommendations: Oral care BID Other Recommendations: Order thickener from pharmacy;Prohibited food (jello, ice cream, thin soups);Remove water pitcher   Follow Up Recommendations  Inpatient Rehab    Frequency and Duration min 2x/week  2 weeks   Pertinent Vitals/Pain n/a     Swallow Study    General HPI: 54 year old male with PMH of heart failure, cocaine abuse, DM, PNA, tonsillectomy, adenoidectomy, tobacco/drug use, medication noncompliance found with facial droop and inability to use left side. CT with question of right MCA territory infarct. Urine drug screen positive for cocaine.  Type of Study: Bedside swallow evaluation Previous Swallow Assessment:  none Diet Prior to this Study: NPO Temperature Spikes Noted: No Respiratory Status: Room air History of Recent Intubation: No Behavior/Cognition: Alert;Cooperative;Pleasant mood Oral Cavity -  Dentition: Adequate natural dentition Self-Feeding Abilities: Able to feed self Patient Positioning: Upright in bed Baseline Vocal Quality: Clear Volitional Cough: Strong Volitional Swallow: Able to elicit    Oral/Motor/Sensory Function Overall Oral Motor/Sensory Function: Impaired Labial ROM: Reduced left Labial Symmetry: Abnormal symmetry left Labial Strength: Within Functional Limits Labial Sensation: Within Functional Limits Lingual ROM: Within Functional Limits Lingual Symmetry: Within Functional Limits Lingual Strength: Reduced Lingual Sensation: Within Functional Limits Facial ROM: Reduced left Facial Symmetry: Left droop Facial Strength: Reduced Facial Sensation: Within Functional Limits Velum: Within Functional Limits Mandible: Within Functional Limits   Ice Chips Ice chips: Not tested   Thin Liquid Thin Liquid: Impaired Presentation: Cup;Self Fed Pharyngeal  Phase Impairments: Suspected delayed Swallow;Cough - Delayed    Nectar Thick Nectar Thick Liquid: Within functional limits Presentation: Cup;Self Fed   Honey Thick Honey Thick Liquid: Not tested   Puree Puree: Within functional limits Presentation: Self Fed;Spoon   Solid   GO    Solid: Within functional limits Presentation: Ginger Blue MA, CCC-SLP [phone]  McCoy Leah [name] 01/25/2015,10:07 AM

## 2015-01-25 NOTE — Consult Note (Signed)
Physical Medicine and Rehabilitation Consult   Reason for Consult: Left sided weakness, left facial droop and slurred speech Referring Physician:  Dr. Candiss Norse   HPI: Dennis [name] is a 54 y.o. 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  Blood sugar- 536 and UDS positive for cocaine. CT head with early signs of R-MCA infarct.  Dr. Armida Sans consulted and recommended full workup for likely cardio-embolic stroke. Swallow evaluation done revealing mild oropharyngeal dysphagia and patient placed on DIII, nectar liquids. Cognitive evaluation done revealing impulsivity as well as subtle short term memory deficits. CIR recommended by MD and ST for follow up therapy.    Fiance as well as mother at bedside. Review of Systems  HENT: Negative for hearing loss.   Eyes: Negative for blurred vision and double vision.  Respiratory: Positive for shortness of breath. Negative for cough and wheezing.   Cardiovascular: Negative for chest pain and palpitations.  Neurological: Positive for speech change and focal weakness. Negative for headaches.      Past Medical History  Diagnosis Date  . Chronic systolic heart failure     a) Mixed ICM/NICM b) RHC (05/2014): RA 2, RV 19/2/3, PA 22/14 (18), PCWP 6, Fick CO/CI: 5.2 / 2.7, PVR 2.3 WU, PA 60% and 64% c) ECHO (05/2014): EF 20-25%, diff HK, akinesis entireanteroseptal myocardium, triv AI, mod MR, LA mod/sev dilated  . Automatic implantable cardioverter-defibrillator in situ   . Heart murmur   . Sleep apnea     "cleared after T&A"  . Left ventricular noncompaction   . Ischemic cardiomyopathy     a) Coronary angiography (12/2008) at [facility]: Lmain: nl, LAD mid 100% stenosis with left to left and right-to-left collaterals to the distal LAD; Lcx: nl, RCA nl.    . Type II  diabetes mellitus   . High cholesterol   . Pneumonia 1988  . Drug abuse and dependence   . CHF (congestive heart failure)     Past Surgical History  Procedure Laterality Date  . Forearm fracture surgery Left 1980  . Cardiac defibrillator placement  03/2011  . Cholecystectomy  1994  . Right heart catheterization N/A 05/30/2014    Procedure: RIGHT HEART CATH;  Surgeon: Jolaine Artist, MD;  Location: [facility] CATH LAB;  Service: Cardiovascular;  Laterality: N/A;  . Fracture surgery    . Cardiac catheterization  05/2014  . Tonsillectomy and adenoidectomy  ~ 1998    Family History  Problem Relation Age of Onset  . Diabetes Mother   . Heart disease Mother   . Diabetes Father   . Prostate cancer Father   . Heart disease Father   . Diabetes Brother   . Diabetes Brother     Social History:  Single. Lives with mother. Unemployed.  reports that he has been smoking Cigarettes.  He has a 3.1 pack-year smoking history. He has never used smokeless tobacco. He reports that he drinks alcohol occasionally.  He reports that he last used  Cocaine a few weeks ago.    Allergies: No Known Allergies    Medications Prior to Admission  Medication Sig Dispense Refill  . acetaminophen (TYLENOL) 500 MG tablet Take 1,000 mg by mouth every 6 (six) hours as needed for moderate pain.    . carvedilol (COREG) 3.125 MG tablet Take 1 tablet (  3.125 mg total) by mouth 2 (two) times daily with a meal. 60 tablet 3  . digoxin (LANOXIN) 0.125 MG tablet Take 1 tablet (0.125 mg total) by mouth daily. 30 tablet 3  . furosemide (LASIX) 40 MG tablet Take 1 tablet (40 mg total) by mouth every other day. 45 tablet 3  . Insulin Glargine (LANTUS) 100 UNIT/ML Solostar Pen Inject 32 Units into the skin daily at 10 pm. 15 mL 11  . traMADol (ULTRAM) 50 MG tablet Take 1 tablet (50 mg total) by mouth every 12 (twelve) hours as needed for moderate pain. 14 tablet 0  . warfarin (COUMADIN) 5 MG tablet Take 10 mg by mouth daily. Patient  states he takes 10mg  every day per patient. Has not taken in two weeks per patient    . atorvastatin (LIPITOR) 40 MG tablet Take 1 tablet (40 mg total) by mouth daily at 6 PM. (Patient not taking: Reported on 01/24/2015) 30 tablet 6  . glucose blood (ONETOUCH VERIO) test strip Use as instructed 100 each 12  . metFORMIN (GLUCOPHAGE) 500 MG tablet Take 1 tablet (500 mg total) by mouth 2 (two) times daily with a meal. (Patient not taking: Reported on 12/03/2014) 180 tablet 3  . QC PEN NEEDLES 31G X 6 MM MISC 1 each by Other route See admin instructions. Use daily with insulin.    Marland Kitchen warfarin (COUMADIN) 5 MG tablet Take as directed by Coumadin clinic (Patient not taking: Reported on 12/03/2014) 50 tablet 0    Home: Home Living Family/patient expects to be discharged to:: Unsure Living Arrangements: Parent Available Help at Discharge: Family, Available 24 hours/day Type of Home: House Home Access: Stairs to enter [employer] of Steps: 3 Entrance Stairs-Rails: Right, Left Home Layout: Multi-level, Able to live on main level with bedroom/bathroom Home Equipment: Tub bench  Lives With: Family  Functional History: Prior Function Level of Independence: Independent Functional Status:  Mobility: Bed Mobility Overal bed mobility: Needs Assistance Bed Mobility: Supine to Sit Supine to sit: Min assist, HOB elevated General bed mobility comments: Min assist for truncal balance. HOB elevated. Cues for technique. Performed towards patients Left side. Transfers Overall transfer level: Needs assistance Equipment used: None Transfers: Sit to/from Stand Sit to Stand: Min assist General transfer comment: Min assist for balance from lowest bed setting. Cues for technique. Mild buckling of LLE however able to self correct.  Ambulation/Gait Ambulation/Gait assistance: Min assist Ambulation Distance (Feet): 20 Feet Assistive device: 1 person hand held assist Gait Pattern/deviations: Step-to  pattern, Decreased step length - right, Decreased step length - left, Decreased stance time - left, Decreased stride length, Decreased dorsiflexion - left, Decreased weight shift to left, Shuffle Gait velocity: decreased Gait velocity interpretation: Below normal speed for age/gender General Gait Details: Min assist for balance on Pts left side. Mild buckling noted at times of Lt knee however able to self correct. Tends to drag LLE behind him. Cues for hip flexion and Lt ankle clearance. Able to stand to urinate with min guard assist. Picked up an item on the floor with min assist for balance. Somewhat impulsive, needs cues to take his time and concentrate on task at hand due to some lack of awareness of deficits.    ADL:    Cognition: Cognition Overall Cognitive Status: Within Functional Limits for tasks assessed Arousal/Alertness: Awake/alert Orientation Level: Oriented X4 Attention: Selective Selective Attention: Appears intact Memory: Appears intact (during formal testing, ? slight difficulty with basic carryo) Awareness: Appears intact Problem Solving: Appears  intact Safety/Judgment: Appears intact Cognition Arousal/Alertness: Lethargic Behavior During Therapy: Impulsive Overall Cognitive Status: Within Functional Limits for tasks assessed  Blood pressure 91/55, pulse 93, temperature 97.5 F (36.4 C), temperature source Oral, resp. rate 16, height 6' (1.829 m), weight 60.011 kg (132 lb 4.8 oz), SpO2 100 %. Physical Exam  Nursing note and vitals reviewed. Constitutional: He is oriented to person, place, and time. He appears well-developed and well-nourished. He appears lethargic. He is easily aroused.  HENT:  Head: Normocephalic and atraumatic.  Eyes: Conjunctivae are normal. Pupils are equal, round, and reactive to light.  Neck: Normal range of motion. Neck supple.  Cardiovascular: Normal rate and regular rhythm.   Respiratory: Effort normal. No respiratory distress. He has no  wheezes.  GI: Soft. Bowel sounds are normal. He exhibits no distension. There is no tenderness.  Neurological: He is oriented to person, place, and time and easily aroused. He appears lethargic.  Had difficulty staying awake but was able to follow basic commands without difficulty. Mild dysarthria noted. Left hemiparesis.   Skin: Skin is warm and dry.  Patient is alert currently. Motor strength is 5/5 in the deltoid biceps triceps grip hip flexor and knee extensor ankle dorsiflexor Left upper extremity 4/5 in the deltoid, biceps, triceps, grip, hip flexor, knee extensor, ankle dorsiflexor and plantar flexor. Sensation intact to light touch in bilateral upper and lower limbs\ Visual fields are intact to confrontation testing he exhibits mild left neglect  Results for orders placed or performed during the hospital encounter of 01/24/15 (from the past 24 hour(s))  CBG monitoring, ED     Status: Abnormal   Collection Time: 01/24/15  4:05 PM  Result Value Ref Range   Glucose-Capillary 367 (H) 70 - 99 mg/dL  Glucose, capillary     Status: Abnormal   Collection Time: 01/24/15  7:56 PM  Result Value Ref Range   Glucose-Capillary 281 (H) 70 - 99 mg/dL   Comment 1 Notify RN   Brain natriuretic peptide     Status: Abnormal   Collection Time: 01/24/15  8:05 PM  Result Value Ref Range   B Natriuretic Peptide 166.1 (H) 0.0 - 100.0 pg/mL  Glucose, capillary     Status: Abnormal   Collection Time: 01/24/15 11:50 PM  Result Value Ref Range   Glucose-Capillary 198 (H) 70 - 99 mg/dL  Glucose, capillary     Status: Abnormal   Collection Time: 01/25/15  4:13 AM  Result Value Ref Range   Glucose-Capillary 62 (L) 70 - 99 mg/dL  Glucose, capillary     Status: Abnormal   Collection Time: 01/25/15  4:35 AM  Result Value Ref Range   Glucose-Capillary 178 (H) 70 - 99 mg/dL   Comment 1 Notify RN   Lipid panel     Status: Abnormal   Collection Time: 01/25/15  5:35 AM  Result Value Ref Range   Cholesterol  197 0 - 200 mg/dL   Triglycerides 42 <150 mg/dL   HDL 73 >39 mg/dL   Total CHOL/HDL Ratio 2.7 RATIO   VLDL 8 0 - 40 mg/dL   LDL Cholesterol 116 (H) 0 - 99 mg/dL  Basic metabolic panel     Status: Abnormal   Collection Time: 01/25/15  5:35 AM  Result Value Ref Range   Sodium 140 135 - 145 mmol/L   Potassium 4.1 3.5 - 5.1 mmol/L   Chloride 104 96 - 112 mmol/L   CO2 25 19 - 32 mmol/L   Glucose, Bld 169 (H)  70 - 99 mg/dL   BUN 16 6 - 23 mg/dL   Creatinine, Ser 0.88 0.50 - 1.35 mg/dL   Calcium 9.5 8.4 - 10.5 mg/dL   GFR calc non Af Amer >90 >90 mL/min   GFR calc Af Amer >90 >90 mL/min   Anion gap 11 5 - 15  Glucose, capillary     Status: Abnormal   Collection Time: 01/25/15  7:47 AM  Result Value Ref Range   Glucose-Capillary 145 (H) 70 - 99 mg/dL  Glucose, capillary     Status: Abnormal   Collection Time: 01/25/15 12:22 PM  Result Value Ref Range   Glucose-Capillary 280 (H) 70 - 99 mg/dL   Ct Angio Head W/cm &/or Wo Cm  01/25/2015   CLINICAL DATA:  54 year old male stroke patient with left side weakness and difficulty swallowing. Not a candidate for thrombolysis. Cannot have MRI. Initial encounter.  EXAM: CT ANGIOGRAPHY HEAD AND NECK  TECHNIQUE: Multidetector CT imaging of the head and neck was performed using the standard protocol during bolus administration of intravenous contrast. Multiplanar CT image reconstructions and MIPs were obtained to evaluate the vascular anatomy. Carotid stenosis measurements (when applicable) are obtained utilizing NASCET criteria, using the distal internal carotid diameter as the denominator.  CONTRAST:  64mL OMNIPAQUE IOHEXOL 350 MG/ML SOLN  COMPARISON:  Head CT without contrast 01/24/2015.  FINDINGS: CT HEAD  Brain: Evolved right MCA anterior division infarct with progressive gray and white matter hypodensity corresponding to the insula and anterior operculum, in the area questioned by CT yesterday. No acute intracranial hemorrhage identified. No associated  mass effect. No ventriculomegaly. Stable gray-white matter differentiation elsewhere.  Calvarium and skull base: Negative.  Paranasal sinuses: Visualized paranasal sinuses and mastoids are clear.  Orbits: Negative.  CTA NECK  Other neck: Left chest cardiac pacemaker with mild streak artifact. Paraseptal and centrilobular emphysema. No superior mediastinal lymphadenopathy.  Thyroid, larynx, pharynx, parapharyngeal spaces, retropharyngeal space and sublingual space are within normal limits.  Diffuse increased attenuation of subcutaneous fat and superficial soft tissues such that there is portal in a shin of the submandibular and parotid glands, as well as the muscle layers. No cervical lymphadenopathy identified.  Skeleton: Degenerative changes in the cervical spine. No acute osseous abnormality identified.  Aortic arch: 3 vessel arch configuration. Mild distal arch calcified plaque. No great vessel origin stenosis.  Right carotid system: Negative right CCA. Widely patent right carotid bifurcation with minimal calcified plaque (series 504, image 99). Negative cervical right ICA.  Left carotid system: Negative left CCA. Negative left carotid bifurcation. Negative cervical left ICA.  Vertebral arteries:  No proximal right subclavian artery stenosis. Paravertebral venous contrast reflux obscures the proximal right vertebral artery and its origin. The right V2 segment is normal. Negative right vertebral artery at the skullbase.  No proximal left subclavian artery stenosis. Non dominant appearing left vertebral artery has a normal origin, and is normal to the skullbase.  CTA HEAD  Posterior circulation: Mildly dominant distal right vertebral artery. Normal PICA origins. Normal vertebrobasilar junction. No basilar artery stenosis. Normal SCA and left PCA origins. Fetal type right PCA origin. Left posterior communicating artery is diminutive or absent. Bilateral PCA branches are within normal limits.  Anterior circulation:  Negative left ICA siphon. Normal left ophthalmic artery origin. Negative right ICA siphon. Normal right ophthalmic and posterior communicating artery origins. Normal carotid termini. Normal MCA and ACA origins. Diminutive or absent anterior communicating artery. Bilateral ACA branches are within normal limits. Left MCA branches are within  normal limits.  Right MCA origin is normal and M1 segment is patent. Right MCA bifurcation is patent. There is then filling defect and irregularity of proximal right MCA M2 branches, with subtotal loss of enhancement of the proximal anterior sylvian division best seen on series 506, image 16. Despite this, there is preserved more distal right M2 and M3 enhancement. No major right MCA branch occlusion is identified.  Venous sinuses: Negative.  Anatomic variants: Mildly dominant right vertebral artery. Fetal type right PCA origin.  Delayed phase: No abnormal enhancement identified.  IMPRESSION: 1. Evolving anterior division right MCA infarct. No hemorrhage or mass effect. 2. Associated filling defect just beyond the right MCA bifurcation affecting the proximal right M2 branches compatible with thromboembolic disease. Despite this, no major right MCA branch occlusion is identified. 3. Otherwise negative intracranial CTA. 4. Minimal atherosclerosis in the neck and aortic arch. 5. Emphysema.   Electronically Signed   By: Genevie Ann M.D.   On: 01/25/2015 14:31   Ct Head Wo Contrast  01/24/2015   CLINICAL DATA:  Awoke with left-sided weakness and difficulty swallowing.  EXAM: CT HEAD WITHOUT CONTRAST  TECHNIQUE: Contiguous axial images were obtained from the base of the skull through the vertex without intravenous contrast.  COMPARISON:  None.  FINDINGS: The brainstem and cerebellum are unremarkable. I think there is debatable loss of gray-white differentiation in the insular region on the right. There may be early change in the posterior frontal region. Basal ganglia appear intact. No  sign of hemorrhage, hydrocephalus or extra-axial collection. No calvarial abnormality. Visualized sinuses are clear.  IMPRESSION: Question loss of gray-white differentiation in the insula and posterior frontal region on the right that could go along with the earliest CT manifestation of acute infarction in the right middle cerebral artery territory. No hemorrhage.   Electronically Signed   By: Nelson Chimes M.D.   On: 01/24/2015 16:08   Ct Angio Neck W/cm &/or Wo/cm  01/25/2015   CLINICAL DATA:  54 year old male stroke patient with left side weakness and difficulty swallowing. Not a candidate for thrombolysis. Cannot have MRI. Initial encounter.  EXAM: CT ANGIOGRAPHY HEAD AND NECK  TECHNIQUE: Multidetector CT imaging of the head and neck was performed using the standard protocol during bolus administration of intravenous contrast. Multiplanar CT image reconstructions and MIPs were obtained to evaluate the vascular anatomy. Carotid stenosis measurements (when applicable) are obtained utilizing NASCET criteria, using the distal internal carotid diameter as the denominator.  CONTRAST:  31mL OMNIPAQUE IOHEXOL 350 MG/ML SOLN  COMPARISON:  Head CT without contrast 01/24/2015.  FINDINGS: CT HEAD  Brain: Evolved right MCA anterior division infarct with progressive gray and white matter hypodensity corresponding to the insula and anterior operculum, in the area questioned by CT yesterday. No acute intracranial hemorrhage identified. No associated mass effect. No ventriculomegaly. Stable gray-white matter differentiation elsewhere.  Calvarium and skull base: Negative.  Paranasal sinuses: Visualized paranasal sinuses and mastoids are clear.  Orbits: Negative.  CTA NECK  Other neck: Left chest cardiac pacemaker with mild streak artifact. Paraseptal and centrilobular emphysema. No superior mediastinal lymphadenopathy.  Thyroid, larynx, pharynx, parapharyngeal spaces, retropharyngeal space and sublingual space are within normal  limits.  Diffuse increased attenuation of subcutaneous fat and superficial soft tissues such that there is portal in a shin of the submandibular and parotid glands, as well as the muscle layers. No cervical lymphadenopathy identified.  Skeleton: Degenerative changes in the cervical spine. No acute osseous abnormality identified.  Aortic arch: 3 vessel arch  configuration. Mild distal arch calcified plaque. No great vessel origin stenosis.  Right carotid system: Negative right CCA. Widely patent right carotid bifurcation with minimal calcified plaque (series 504, image 99). Negative cervical right ICA.  Left carotid system: Negative left CCA. Negative left carotid bifurcation. Negative cervical left ICA.  Vertebral arteries:  No proximal right subclavian artery stenosis. Paravertebral venous contrast reflux obscures the proximal right vertebral artery and its origin. The right V2 segment is normal. Negative right vertebral artery at the skullbase.  No proximal left subclavian artery stenosis. Non dominant appearing left vertebral artery has a normal origin, and is normal to the skullbase.  CTA HEAD  Posterior circulation: Mildly dominant distal right vertebral artery. Normal PICA origins. Normal vertebrobasilar junction. No basilar artery stenosis. Normal SCA and left PCA origins. Fetal type right PCA origin. Left posterior communicating artery is diminutive or absent. Bilateral PCA branches are within normal limits.  Anterior circulation: Negative left ICA siphon. Normal left ophthalmic artery origin. Negative right ICA siphon. Normal right ophthalmic and posterior communicating artery origins. Normal carotid termini. Normal MCA and ACA origins. Diminutive or absent anterior communicating artery. Bilateral ACA branches are within normal limits. Left MCA branches are within normal limits.  Right MCA origin is normal and M1 segment is patent. Right MCA bifurcation is patent. There is then filling defect and  irregularity of proximal right MCA M2 branches, with subtotal loss of enhancement of the proximal anterior sylvian division best seen on series 506, image 16. Despite this, there is preserved more distal right M2 and M3 enhancement. No major right MCA branch occlusion is identified.  Venous sinuses: Negative.  Anatomic variants: Mildly dominant right vertebral artery. Fetal type right PCA origin.  Delayed phase: No abnormal enhancement identified.  IMPRESSION: 1. Evolving anterior division right MCA infarct. No hemorrhage or mass effect. 2. Associated filling defect just beyond the right MCA bifurcation affecting the proximal right M2 branches compatible with thromboembolic disease. Despite this, no major right MCA branch occlusion is identified. 3. Otherwise negative intracranial CTA. 4. Minimal atherosclerosis in the neck and aortic arch. 5. Emphysema.   Electronically Signed   By: Genevie Ann M.D.   On: 01/25/2015 14:31    Assessment/Plan: Diagnosis: Right ACA infarct probably cardioembolic with left hemiparesis and left neglect as well as cognitive deficits 1. Does the need for close, 24 hr/day medical supervision in concert with the patient's rehab needs make it unreasonable for this patient to be served in a less intensive setting? Yes 2. Co-Morbidities requiring supervision/potential complications: Cocaine abuse, hypertension, type 2 diabetes, dysphagia 3. Due to bladder management, bowel management, safety, skin/wound care, disease management, medication administration, pain management and patient education, does the patient require 24 hr/day rehab nursing? Yes 4. Does the patient require coordinated care of a physician, rehab nurse, PT (1-2 hrs/day, 5 days/week), OT (1-2 hrs/day, 5 days/week) and SLP (0.5-1 hrs/day, 55 days/week) to address physical and functional deficits in the context of the above medical diagnosis(es)? Yes Addressing deficits in the following areas: balance, endurance, locomotion,  strength, transferring, bowel/bladder control, bathing, dressing, feeding, grooming, toileting, cognition, speech, language, swallowing and psychosocial support 5. Can the patient actively participate in an intensive therapy program of at least 3 hrs of therapy per day at least 5 days per week? Yes 6. The potential for patient to make measurable gains while on inpatient rehab is excellent 7. Anticipated functional outcomes upon discharge from inpatient rehab are supervision  with PT, supervision with OT, supervision with  SLP. 8. Estimated rehab length of stay to reach the above functional goals is: 10-14 days 9. Does the patient have adequate social supports and living environment to accommodate these discharge functional goals? Yes 10. Anticipated D/C setting: Home 11. Anticipated post D/C treatments: [facility] therapy 12. Overall Rehab/Functional Prognosis: good  RECOMMENDATIONS: This patient's condition is appropriate for continued rehabilitative care in the following setting: CIR Patient has agreed to participate in recommended program. Yes Note that insurance prior authorization may be required for reimbursement for recommended care.  Comment:     01/25/2015

## 2015-01-25 NOTE — Progress Notes (Signed)
Patient Demographics  Dennis [name], is a 54 y.o. male, DOB - Dec 27, 1960, [place] date - 01/24/2015   Admitting Physician Annita Brod, MD  Outpatient Primary MD for the patient is Mauricio Po, [place]  LOS - 1   Chief Complaint  Patient presents with  . Weakness        Subjective:   Dennis [name] today has, No headache, No chest pain, No abdominal pain - No Nausea, No new weakness tingling or numbness, No Cough - SOB. Dense left-sided weakness.  Assessment & Plan    1.Dense L Hemiparesis -  likely right MCA stroke. Currently on aspirin, statin added as LDL was about 70. Counseled to quit smoking and cocaine. Continue full stroke workup. Neurology following. PT OT and speech to see. We'll try placement.   2. Smoking and cocaine abuse. Counseled to quit.   3. Chr. Systolic heart failure with EF 20%. Has AICD. Baseline systolic blood pressures over the last 1 year have been around 85. Monitor with supportive care. Currently appears compensated. Cannot use diuretics, beta blocker or ACE inhibitor due to low baseline blood pressures and with a new stroke.   4.DM2 - on Lantus and sliding scale, poor control. Poor compliance with medications counseled.  Lab Results  Component Value Date   HGBA1C 10.5* 09/21/2014    CBG (last 3)   Recent Labs  01/25/15 0413 01/25/15 0435 01/25/15 0747  GLUCAP 62* 178* 145*     5. Dyslipidemia. Poor compliance with medications. DL 90. Statin dose adjusted.     Code Status: Full  Family Communication: None  Disposition Plan: SNF   Procedures  CT head, echo gram, carotid duplex   Consults  Neuro   Medications  Scheduled Meds: . aspirin EC  81 mg Oral Daily  . atorvastatin  20 mg Oral q1800  . heparin subcutaneous  5,000  Units Subcutaneous 3 times per day  . insulin aspart  0-20 Units Subcutaneous 6 times per day  . insulin detemir  10 Units Subcutaneous QHS   Continuous Infusions: . sodium chloride 50 mL/hr at 01/25/15 0800   PRN Meds:.RESOURCE THICKENUP CLEAR, senna-docusate  DVT Prophylaxis    Heparin  Lab Results  Component Value Date   PLT 199 01/24/2015    Antibiotics    Anti-infectives    None          Objective:   Filed Vitals:   01/25/15 0400 01/25/15 0500 01/25/15 0600 01/25/15 1020  BP: 106/78 97/68 97/68  95/71  Pulse: 89 87 87 98  Temp: 97.6 F (36.4 C) 98.6 F (37 C) 98.6 F (37 C) 98.9 F (37.2 C)  TempSrc: Oral Oral Oral Oral  Resp: 16 18 18 19   Height:      Weight:      SpO2: 99% 98% 98% 99%    Wt Readings from Last 3 Encounters:  01/24/15 60.011 kg (132 lb 4.8 oz)  12/01/14 71.396 kg (157 lb 6.4 oz)  11/30/14 72.576 kg (160 lb)     Intake/Output Summary (Last 24 hours) at 01/25/15 1119 Last data filed at 01/24/15 1423  Gross per 24 hour  Intake    500 ml  Output    400 ml  Net  100 ml     Physical Exam  Awake Alert, Oriented X 3, No new F.N deficits, Normal affect, dense left-sided hemiparesis [place].AT,PERRAL Supple Neck,No JVD, No cervical lymphadenopathy appriciated.  Symmetrical Chest wall movement, Good air movement bilaterally, CTAB RRR,No Gallops,Rubs or new Murmurs, No Parasternal Heave +ve B.Sounds, Abd Soft, No tenderness, No organomegaly appriciated, No rebound - guarding or rigidity. No Cyanosis, Clubbing or edema, No new Rash or bruise      Data Review   Micro Results No results found for this or any previous visit (from the past 240 hour(s)).  Radiology Reports Ct Head Wo Contrast  01/24/2015   CLINICAL DATA:  Awoke with left-sided weakness and difficulty swallowing.  EXAM: CT HEAD WITHOUT CONTRAST  TECHNIQUE: Contiguous axial images were obtained from the base of the skull through the vertex without intravenous contrast.   COMPARISON:  None.  FINDINGS: The brainstem and cerebellum are unremarkable. I think there is debatable loss of gray-white differentiation in the insular region on the right. There may be early change in the posterior frontal region. Basal ganglia appear intact. No sign of hemorrhage, hydrocephalus or extra-axial collection. No calvarial abnormality. Visualized sinuses are clear.  IMPRESSION: Question loss of gray-white differentiation in the insula and posterior frontal region on the right that could go along with the earliest CT manifestation of acute infarction in the right middle cerebral artery territory. No hemorrhage.   Electronically Signed   By: Nelson Chimes M.D.   On: 01/24/2015 16:08     CBC  Recent Labs Lab 01/24/15 1337 01/24/15 1359  WBC  --  9.4  HGB 17.7* 15.5  HCT 52.0 46.1  PLT  --  199  MCV  --  92.2  MCH  --  31.0  MCHC  --  33.6  RDW  --  12.2  LYMPHSABS  --  2.2  MONOABS  --  0.4  EOSABS  --  0.0  BASOSABS  --  0.0    Chemistries   Recent Labs Lab 01/24/15 1325 01/24/15 1337 01/25/15 0535  NA 131* 132* 140  K 4.8 4.9 4.1  CL 96 95* 104  CO2 27  --  25  GLUCOSE 536* 564* 169*  BUN 16 19 16   CREATININE 1.07 1.00 0.88  CALCIUM 9.2  --  9.5  AST 18  --   --   ALT 18  --   --   ALKPHOS 99  --   --   BILITOT 1.0  --   --    ------------------------------------------------------------------------------------------------------------------ estimated creatinine clearance is 83.3 mL/min (by C-G formula based on Cr of 0.88). ------------------------------------------------------------------------------------------------------------------ No results for input(s): HGBA1C in the last 72 hours. ------------------------------------------------------------------------------------------------------------------  Recent Labs  01/25/15 0535  CHOL 197  HDL 73  LDLCALC 116*  TRIG 42  CHOLHDL 2.7    ------------------------------------------------------------------------------------------------------------------ No results for input(s): TSH, T4TOTAL, T3FREE, THYROIDAB in the last 72 hours.  Invalid input(s): FREET3 ------------------------------------------------------------------------------------------------------------------ No results for input(s): VITAMINB12, FOLATE, FERRITIN, TIBC, IRON, RETICCTPCT in the last 72 hours.  Coagulation profile  Recent Labs Lab 01/24/15 1325  INR 1.02    No results for input(s): DDIMER in the last 72 hours.  Cardiac Enzymes No results for input(s): CKMB, TROPONINI, MYOGLOBIN in the last 168 hours.  Invalid input(s): CK ------------------------------------------------------------------------------------------------------------------ Invalid input(s): POCBNP   Time Spent in minutes   35   Lala Lund K M.D on 01/25/2015 at 11:19 AM  Between 7am to 7pm - Pager - [phone]  After 7pm go  to [email or web address] - password [facility]  [facility]   Office  [phone]

## 2015-01-25 NOTE — Progress Notes (Addendum)
I await OT eval so I can begin [employer] approval for a possible inpt rehab admission pending insurance approval and bed available when medical workup completed. I will follow up tomorrow. [phone] I met with pt, his Mom, and girlfriend at bedside. He is in agreement to inpt rehab pending insurance approval. I will follow up tomorrow. [phone]

## 2015-01-25 NOTE — Progress Notes (Signed)
OT Cancellation Note  Patient Details Name: Dennis [name] MRN: [id number] DOB: 10-05-1961   Cancelled Treatment:     Attempted to see. Pt at CT. Will follow up .  [place], OTR/L  [phone] 01/25/2015 01/25/2015, 12:00 PM

## 2015-01-25 NOTE — Progress Notes (Signed)
Inpatient Diabetes Program Recommendations  AACE/ADA: New Consensus Statement on Inpatient Glycemic Control (2013)  Target Ranges:  Prepandial:   less than 140 mg/dL      Peak postprandial:   less than 180 mg/dL (1-2 hours)      Critically ill patients:  140 - 180 mg/dL   Results for Dennis [name], GRANTHAM (MRN [id number]) as of 01/25/2015 13:03  Ref. Range 01/24/2015 23:50 01/25/2015 04:13 01/25/2015 04:35 01/25/2015 07:47 01/25/2015 12:22  Glucose-Capillary Latest Ref Range: 70-99 mg/dL 198 (H) 62 (L) 178 (H) 145 (H) 280 (H)   Reason for assessment: elevated CBG  Diabetes history: Type 2 Outpatient Diabetes medications: Lantus 32 units q day, Metformin 1000mg /day Current orders for Inpatient glycemic control: Levemir 10 units q day, Novolog 0-20 units q4h  Please consider changing the Novolog resistant scale from q4h to tid and hs since the patient is eating.   Gentry Fitz, RN, BA, MHA, CDE Diabetes Coordinator Inpatient Diabetes Program  [phone] (Team Pager) [phone] [facility] Office) 01/25/2015 1:07 PM

## 2015-01-25 NOTE — Progress Notes (Signed)
Pt complained that he was experiencing shortness of breath. Pt's SpO2 was 100%. RN paged attending MD. MD ordered 20 mg IV lasix and a breathing treatment. RN gave medications to patient. Per MD, pt's baseline SBP is 85. RN passed on information to night RN and asked her to page on call MD if pt is experiencing SOB an hour after receiving the lasix.

## 2015-01-25 NOTE — Progress Notes (Signed)
Hypoglycemic Event  CBG: 67  Treatment: D50W (7ml) per hypoglycemic protocol  Symptoms: diaphoresis  Follow-up CBG: Time:0425 CBG Result:178  Possible Reasons for Event: Sensitive sliding scale, not obese  Comments/MD notified: K Shorr notified    Michaela Corner  Remember to initiate Hypoglycemia Order Set & complete

## 2015-01-25 NOTE — Progress Notes (Addendum)
ANTICOAGULATION CONSULT NOTE - Initial Consult  Pharmacy Consult for Coumadin Indication: stroke and non-compaction cardiomyopathy  No Known Allergies  Patient Measurements: Height: 6' (182.9 cm) Weight: 132 lb 4.8 oz (60.011 kg) IBW/kg (Calculated) : 77.6  Vital Signs: Temp: 97.5 F (36.4 C) (04/14 1306) Temp Source: Oral (04/14 1306) BP: 91/55 mmHg (04/14 1306) Pulse Rate: 93 (04/14 1306)  Labs:  Recent Labs  01/24/15 1325 01/24/15 1337 01/24/15 1359 01/25/15 0535  HGB  --  17.7* 15.5  --   HCT  --  52.0 46.1  --   PLT  --   --  199  --   APTT 29  --   --   --   LABPROT 13.5  --   --   --   INR 1.02  --   --   --   CREATININE 1.07 1.00  --  0.88    Estimated Creatinine Clearance: 83.3 mL/min (by C-G formula based on Cr of 0.88).   Medical History: Past Medical History  Diagnosis Date  . Chronic systolic heart failure     a) Mixed ICM/NICM b) RHC (05/2014): RA 2, RV 19/2/3, PA 22/14 (18), PCWP 6, Fick CO/CI: 5.2 / 2.7, PVR 2.3 WU, PA 60% and 64% c) ECHO (05/2014): EF 20-25%, diff HK, akinesis entireanteroseptal myocardium, triv AI, mod MR, LA mod/sev dilated  . Automatic implantable cardioverter-defibrillator in situ   . Heart murmur   . Sleep apnea     "cleared after T&A"  . Left ventricular noncompaction   . Ischemic cardiomyopathy     a) Coronary angiography (12/2008) at [facility]: Lmain: nl, LAD mid 100% stenosis with left to left and right-to-left collaterals to the distal LAD; Lcx: nl, RCA nl.    . Type II diabetes mellitus   . High cholesterol   . Pneumonia 1988  . Drug abuse and dependence   . CHF (congestive heart failure)    Assessment:   54 yr old male on long-term Coumadin for non-compaction cardiomyopathy admitted 01/24/15 with new stroke.  Had not been taking his Coumadin for about 2 weeks prior to admission.  INR 1.02, at baseline.  OK to resume Coumadin per Neurology, as infarct size is not big.  Patient reports most recent Coumadin regimen was 10  mg daily.  Currently on Heparin 5000 units sq q8hrs and EC ASA 81 mg daily.    Goal of Therapy:  INR 2-3 Monitor platelets by anticoagulation protocol: Yes   Plan:   Coumadin 10 mg x 1 tonight.  Daily PT/INR.  Stop Aspirin and SQ heparin when INR > 2.  Plan is to try to reach INR goal in 3-5 days.  Arty Baumgartner, [place] Pager: [phone] 01/25/2015,6:49 PM

## 2015-01-25 NOTE — Progress Notes (Signed)
STROKE TEAM PROGRESS NOTE   HISTORY Dennis [name] is an 54 y.o. male with a past medical history significant for hypercholesterolemia, DM, CAD, chronic systolic congestive heart failure with last ECHO 12/15 EF 20%, left ventricular non-compaction on coumadin, status post pacemaker/AICD placement, substance abuse, OSA, brought in by family member for further evaluation of left hemiparesis, left face weakness, dysarthria. Patient was reportedly able to walk last night (LKW 01/23/2105, time unknown), but he said that he woke up this morning 01/23/2105 feeling no well and around 8 am fell to the floor and had to crawl to a chair. According to his family member, by 8:30 she noticed that he was slurring his words, had left face droop, and was dragging his left leg and complaining that he was not able to walk. He is on chronic coumadin but hasn't been taking it for 2 weeks because he can not afford it. CT brain showed possible loss of of gray-white differentiation in the insula and posterior right frontal region concerning for acute ischemia. Denies HA, vertigo, double vision, numbness-tingling, language or vision impairment. Having trouble handling his own secretions. Serologies significant for Na 131, glucose 536. UDS positive for cocaine. ETOH<5. INR 1.02. Patient was not administered TPA secondary to delay in arrival. He was admitted for further evaluation and treatment.   SUBJECTIVE (INTERVAL HISTORY) His family is at the bedside.  Overall he feels his condition is rapid improving. His left hemiparesis really improved much. CT showed right MCA stroke not big. Plan to restart coumadin and INR will reach goal in 3-5 days.   OBJECTIVE Temp:  [97.6 F (36.4 C)-98.9 F (37.2 C)] 98.9 F (37.2 C) (04/14 1020) Pulse Rate:  [81-109] 98 (04/14 1020) Cardiac Rhythm:  [-] Normal sinus rhythm (04/13 2000) Resp:  [12-20] 19 (04/14 1020) BP: (83-117)/(58-83) 95/71 mmHg (04/14 1020) SpO2:  [95 %-100 %] 99 % (04/14  1020) Weight:  [60.011 kg (132 lb 4.8 oz)] 60.011 kg (132 lb 4.8 oz) (04/13 2000)   Recent Labs Lab 01/24/15 1956 01/24/15 2350 01/25/15 0413 01/25/15 0435 01/25/15 0747  GLUCAP 281* 198* 62* 178* 145*    Recent Labs Lab 01/24/15 1325 01/24/15 1337 01/25/15 0535  NA 131* 132* 140  K 4.8 4.9 4.1  CL 96 95* 104  CO2 27  --  25  GLUCOSE 536* 564* 169*  BUN 16 19 16   CREATININE 1.07 1.00 0.88  CALCIUM 9.2  --  9.5    Recent Labs Lab 01/24/15 1325  AST 18  ALT 18  ALKPHOS 99  BILITOT 1.0  PROT 6.3  ALBUMIN 3.3*    Recent Labs Lab 01/24/15 1337 01/24/15 1359  WBC  --  9.4  NEUTROABS  --  6.8  HGB 17.7* 15.5  HCT 52.0 46.1  MCV  --  92.2  PLT  --  199   No results for input(s): CKTOTAL, CKMB, CKMBINDEX, TROPONINI in the last 168 hours.  Recent Labs  01/24/15 1325  LABPROT 13.5  INR 1.02    Recent Labs  01/24/15 1254  COLORURINE YELLOW  LABSPEC 1.041*  PHURINE 5.0  GLUCOSEU >1000*  HGBUR NEGATIVE  BILIRUBINUR NEGATIVE  KETONESUR 15*  PROTEINUR NEGATIVE  UROBILINOGEN 0.2  NITRITE NEGATIVE  LEUKOCYTESUR NEGATIVE       Component Value Date/Time   CHOL 197 01/25/2015 0535   TRIG 42 01/25/2015 0535   HDL 73 01/25/2015 0535   CHOLHDL 2.7 01/25/2015 0535   VLDL 8 01/25/2015 0535   LDLCALC 116* 01/25/2015 0535  Lab Results  Component Value Date   HGBA1C 10.5* 09/21/2014      Component Value Date/Time   LABOPIA NONE DETECTED 01/24/2015 1254   COCAINSCRNUR POSITIVE* 01/24/2015 1254   LABBENZ NONE DETECTED 01/24/2015 1254   AMPHETMU NONE DETECTED 01/24/2015 1254   THCU NONE DETECTED 01/24/2015 1254   LABBARB NONE DETECTED 01/24/2015 1254     Recent Labs Lab 01/24/15 1325  ETH <5    Ct Head Wo Contrast  01/24/2015   IMPRESSION: Question loss of gray-white differentiation in the insula and posterior frontal region on the right that could go along with the earliest CT manifestation of acute infarction in the right middle cerebral  artery territory. No hemorrhage.      Ct Angio head and Neck W/cm &/or Wo/cm  01/25/2015   IMPRESSION: 1. Evolving anterior division right MCA infarct. No hemorrhage or mass effect. 2. Associated filling defect just beyond the right MCA bifurcation affecting the proximal right M2 branches compatible with thromboembolic disease. Despite this, no major right MCA branch occlusion is identified. 3. Otherwise negative intracranial CTA. 4. Minimal atherosclerosis in the neck and aortic arch. 5. Emphysema.     2D echo - pending  PHYSICAL EXAM  Temp:  [97.5 F (36.4 C)-98.9 F (37.2 C)] 97.5 F (36.4 C) (04/14 1306) Pulse Rate:  [81-109] 93 (04/14 1306) Resp:  [16-19] 16 (04/14 1306) BP: (91-106)/(55-78) 91/55 mmHg (04/14 1306) SpO2:  [98 %-100 %] 100 % (04/14 1306) Weight:  [132 lb 4.8 oz (60.011 kg)] 132 lb 4.8 oz (60.011 kg) (04/13 2000)  General - Well nourished, well developed, in no apparent distress, eating lunch.  Ophthalmologic - fundi not visualized due to incorporation.  Cardiovascular - Regular rate and rhythm.  Mental Status -  Level of arousal and orientation to time, place, and person were intact. Language including expression, naming, repetition, comprehension was assessed and found intact.  Cranial Nerves II - XII - II - Visual field intact OU. III, IV, VI - Extraocular movements intact. V - Facial sensation intact bilaterally. VII - left facial droop. VIII - Hearing & vestibular intact bilaterally. X - Palate elevates symmetrically. XI - Chin turning & shoulder shrug intact bilaterally. XII - Tongue protrusion intact.  Motor Strength - The patient's strength was LUE 3/5 proximal and 4/5 distal, LLE 5/5 proximal and 4/5 distal, and pronator drift was present.  RUE and RLE 5/5. Bulk was normal and fasciculations were absent.   Motor Tone - Muscle tone was assessed at the neck and appendages and was normal.  Reflexes - The patient's reflexes were 1+ in all extremities  and he had no pathological reflexes.  Sensory - Light touch, temperature/pinprick were assessed and were symmetrical.    Coordination - The patient had normal movements in the right hand with no ataxia or dysmetria.  Tremor was absent.  Gait and Station - deferred due to safety concerns.   ASSESSMENT/PLAN [name] is a 54 y.o. male with history of  hypercholesterolemia, DM, CAD, chronic systolic congestive heart failure with last ECHO 12/15 EF 20%, left ventricular non-compaction on coumadin, status post pacemaker/AICD placement, substance abuse, OSA,  presenting with left hemiparesis, left face weakness, dysarthria. He did not receive IV t-PA due to delay in arrival.   Stroke:  Non-dominant right MCA infarct, felt to be embolic due to low EF, imaging pending  Resultant  Left hemiparesis  MRI  / MRA  Not able to perform due to ICD  Repeat CT showed moderate  size left MCA stroke  CTA head & neck unremarkable   2D Echo  pending   LDL 116, not at goal  HgbA1c pending, 10.5 in Dec 2015  SCDs for VTE prophylaxis DIET DYS 3 Room service appropriate?: Yes; Fluid consistency:: Nectar Thick  warfarin prior to admission, now ordered  aspirin 81 mg orally every day , did receive aspirin suppository yesterday. Not taking routinely prior to admission. INR 1.02 on arrival. CT repeat showed infarct size not big, safe to start coumadin today and INR goal will reach in 3-5 days. Once INR 2-3, aspirin can be stopped.  Patient counseled to be compliant with his antithrombotic medications  Ongoing aggressive stroke risk factor management  Therapy recommendations:  pending   Disposition:  pending   Cardiomyopathy, LVNC on coumadin  Chronic anticoagulation  Pt not compliant with medication  INR 1.02 on admission  CT repeat showed infarct size not big, safe to start coumadin today and INR goal will reach in 3-5 days.  Hypertension/Hypotension  Home meds:  Corge, lasix  Home  meds on hold due to hypotension since admission  BP 83-117/58-78 past 24h (01/25/2015 @ 12:02 PM)   Hyperlipidemia  Home meds:  lipitior 40 resumed in hospital  LDL 116, goal < 70  Patient encouraged to be compliant  Continue statin at discharge  Diabetes  HgbA1c 10.5 in Dec 2015, goal < 7.0  Uncontrolled. Glu 564 yesterday  Hypoglycemic event w/ diaphoresis, given 25 ml D5  Cocaine use  Positive UDS   cocaine cessation counseling provided  Pt is willing to quit  Other Stroke Risk Factors  Cigarette smoker, advised to stop smoking  Occasional ETOH use  Other Active Problems  chronic systolic heart failure with ejection fraction of 20%/left ventricular non-compaction on chronic anticoagulation, noncompliant  Hospital day # [place] for Pager information 01/25/2015 12:09 PM   I, the attending vascular neurologist, have personally obtained a history, examined the patient, evaluated laboratory data, individually viewed imaging studies and agree with radiology interpretations. I also obtained additional history from pt's wife at bedside. I also discussed with Dr. Candiss Norse regarding his care plan. Together with the NP/PA, we formulated the assessment and plan of care which reflects our mutual decision.  I have made any additions or clarifications directly to the above note and agree with the findings and plan as currently documented.   54 year old male with history of diabetes, HLD, cardiomyopathy with EF 20%, left ventricle non-compaction on Coumadin was admitted for left MCA stroke. Repeat CT showed stroke size not big, recommend to start Coumadin today with INR reached goal in 3-5 days. LDL and A1c was high, currently on statin and aggressive management by primary team. Patient not compliant with medication at home. Await 2-D echo.  Rosalin Hawking, MD PhD Stroke Neurology 01/25/2015 6:13 PM       To contact Stroke Continuity  provider, please refer to [email or web address] After hours, contact General Neurology

## 2015-01-25 NOTE — Evaluation (Signed)
Speech Language Pathology Evaluation Patient Details Name: Dennis [name] MRN: [id number] DOB: 09-Dec-1960 Today's Date: 01/25/2015 Time: 9622-2979 SLP Time Calculation (min) (ACUTE ONLY): 25 min  Problem List:  Patient Active Problem List   Diagnosis Date Noted  . Acute systolic heart failure 89/21/1941  . Acute ischemic stroke 01/24/2015  . Cocaine abuse 01/24/2015  . H/O noncompliance with medical treatment, presenting hazards to health 01/24/2015  . Tobacco abuse 01/24/2015  . Essential hypertension 01/24/2015  . Hypotension 01/24/2015  . Hyperlipidemia 01/24/2015  . Dysphagia following cerebral infarction 01/24/2015  . Hemiplegia affecting left nondominant side 01/24/2015  . Diabetes mellitus type 2, uncontrolled, with complications 74/05/1447  . Hyperlipidemia associated with type 2 diabetes mellitus 11/30/2014  . CAD (coronary artery disease) 11/16/2014  . Acute on chronic systolic CHF (congestive heart failure) 09/21/2014  . Chest pain on breathing 09/21/2014  . Chronic systolic heart failure 18/56/3149  . Acute on chronic systolic heart failure   . Automatic implantable cardioverter-defibrillator in situ   . Left ventricular noncompaction 05/27/2014  . Dyspnea 05/26/2014  . Chest discomfort 08/09/2013  . Long term (current) use of anticoagulants 08/09/2013  . Drug abuse and dependence   . CHF (congestive heart failure)   . BP (high blood pressure) 03/04/2013  . Cardiomyopathy, ischemic 07/21/2011  . Type II diabetes mellitus with ophthalmic manifestations 09/13/2007  . TOBACCO ABUSE 09/13/2007  . WEIGHT LOSS, ABNORMAL 09/13/2007  . SOB 09/13/2007   Past Medical History:  Past Medical History  Diagnosis Date  . Chronic systolic heart failure     a) Mixed ICM/NICM b) RHC (05/2014): RA 2, RV 19/2/3, PA 22/14 (18), PCWP 6, Fick CO/CI: 5.2 / 2.7, PVR 2.3 WU, PA 60% and 64% c) ECHO (05/2014): EF 20-25%, diff HK, akinesis entireanteroseptal myocardium, triv AI, mod MR, LA  mod/sev dilated  . Automatic implantable cardioverter-defibrillator in situ   . Heart murmur   . Sleep apnea     "cleared after T&A"  . Left ventricular noncompaction   . Ischemic cardiomyopathy     a) Coronary angiography (12/2008) at [facility] Lmain: nl, LAD mid 100% stenosis with left to left and right-to-left collaterals to the distal LAD; Lcx: nl, RCA nl.    . Type II diabetes mellitus   . High cholesterol   . Pneumonia 1988  . Drug abuse and dependence    Past Surgical History:  Past Surgical History  Procedure Laterality Date  . Forearm fracture surgery Left 1980  . Cardiac defibrillator placement  03/2011  . Cholecystectomy  1994  . Right heart catheterization N/A 05/30/2014    Procedure: RIGHT HEART CATH;  Surgeon: Jolaine Artist, MD;  Location: [facility] CATH LAB;  Service: Cardiovascular;  Laterality: N/A;  . Fracture surgery    . Cardiac catheterization  05/2014  . Tonsillectomy and adenoidectomy  ~ 19929   HPI:  54 year old male with PMH of heart failure, cocaine abuse, DM, PNA, tonsillectomy, adenoidectomy, tobacco/drug use, medication noncompliance found with facial droop and inability to use left side. CT with question of right MCA territory infarct. Urine drug screen positive for cocaine.    Assessment / Plan / Recommendation Clinical Impression  Cognitive-linguistc evaluation complete. Overall, patient presents with what appears to be intact cognitive abilities with 100% accuracy during formal testing however some subtle short term memory impairement present during function activities and patient impulsive at time (unlcear baseline). Mild dysarthria present due to lingual and facial weakness. SLP will continue to f/u for dysarthria treatment and  assessment of high level cognitive function. Recommend CIR consult.     SLP Assessment  Patient needs continued Speech Lanaguage Pathology Services    Follow Up Recommendations  Inpatient Rehab    Frequency and Duration min  2x/week      Pertinent Vitals/Pain Pain Assessment: No/denies pain       SLP Evaluation Prior Functioning  Cognitive/Linguistic Baseline: Information not available  Lives With:  (mother) Vocation: On disability   Cognition  Overall Cognitive Status:  (unclear baseline) Arousal/Alertness: Awake/alert Orientation Level: Oriented X4 Attention: Selective Selective Attention: Appears intact Memory: Appears intact (during formal testing, ? slight difficulty with basic carryo) Awareness: Appears intact Problem Solving: Appears intact Safety/Judgment: Appears intact    Comprehension  Auditory Comprehension Overall Auditory Comprehension: Appears within functional limits for tasks assessed Reading Comprehension Reading Status: Within funtional limits    Expression Expression Primary Mode of Expression: Verbal Verbal Expression Overall Verbal Expression: Appears within functional limits for tasks assessed   Oral / Motor Oral Motor/Sensory Function Overall Oral Motor/Sensory Function: Impaired Labial ROM: Reduced left Labial Symmetry: Abnormal symmetry left Labial Strength: Within Functional Limits Labial Sensation: Within Functional Limits Lingual ROM: Within Functional Limits Lingual Symmetry: Within Functional Limits Lingual Strength: Reduced Lingual Sensation: Within Functional Limits Facial ROM: Reduced left Facial Symmetry: Left droop Facial Strength: Reduced Facial Sensation: Within Functional Limits Velum: Within Functional Limits Mandible: Within Functional Limits Motor Speech Overall Motor Speech: Impaired Respiration: Within functional limits Phonation: Normal Resonance: Within functional limits Articulation: Impaired Level of Impairment: Sentence Intelligibility: Intelligible (mild dysarthria) Motor Planning: Witnin functional limits Effective Techniques: Slow rate;Increased vocal intensity   GO   [name] MA, CCC-SLP [phone]   [name]  [name] 01/25/2015, 10:31 AM

## 2015-01-25 NOTE — Progress Notes (Signed)
Rehab Admissions Coordinator Note:  Patient was screened by Retta Diones for appropriateness for an Inpatient Acute Rehab Consult.  At this time, we are recommending Inpatient Rehab consult.  Retta Diones 01/25/2015, 1:50 PM  I can be reached at [phone].

## 2015-01-25 NOTE — Evaluation (Signed)
Physical Therapy Evaluation Patient Details Name: Dennis [name] MRN: [id number] DOB: September 09, 1961 Today's Date: 01/25/2015   History of Present Illness  54 y.o. male admitted with Dense L Hemiparesis - likely right MCA stroke.  Clinical Impression  Pt admitted with the above complication. Pt currently with functional limitations due to the deficits listed below (see PT Problem List). Requires min assist for safe mobility including ambulation of short distances. Mild weakness and poor coordination of LLE with significant weakness of LUE. Patient would benefit from CIR consult for possible placement in order to improve patients functional mobility and independence with daily activities. Pt will benefit from skilled PT to increase their independence and safety with mobility to allow discharge to the venue listed below.       Follow Up Recommendations CIR    Equipment Recommendations   (TBD at next venue of care)    Recommendations for Other Services Rehab consult;OT consult     Precautions / Restrictions Precautions Precautions: Fall Restrictions Weight Bearing Restrictions: No      Mobility  Bed Mobility Overal bed mobility: Needs Assistance Bed Mobility: Supine to Sit     Supine to sit: Min assist;HOB elevated     General bed mobility comments: Min assist for truncal balance. HOB elevated. Cues for technique. Performed towards patients Left side.  Transfers Overall transfer level: Needs assistance Equipment used: None Transfers: Sit to/from Stand Sit to Stand: Min assist         General transfer comment: Min assist for balance from lowest bed setting. Cues for technique. Mild buckling of LLE however able to self correct.   Ambulation/Gait Ambulation/Gait assistance: Min assist Ambulation Distance (Feet): 20 Feet Assistive device: 1 person hand held assist Gait Pattern/deviations: Step-to pattern;Decreased step length - right;Decreased step length - left;Decreased  stance time - left;Decreased stride length;Decreased dorsiflexion - left;Decreased weight shift to left;Shuffle Gait velocity: decreased Gait velocity interpretation: Below normal speed for age/gender General Gait Details: Min assist for balance on Pts left side. Mild buckling noted at times of Lt knee however able to self correct. Tends to drag LLE behind him. Cues for hip flexion and Lt ankle clearance. Able to stand to urinate with min guard assist. Picked up an item on the floor with min assist for balance. Somewhat impulsive, needs cues to take his time and concentrate on task at hand due to some lack of awareness of deficits.  Stairs            Wheelchair Mobility    Modified Rankin (Stroke Patients Only) Modified Rankin (Stroke Patients Only) Pre-Morbid Rankin Score: No symptoms Modified Rankin: Moderately severe disability     Balance Overall balance assessment: Needs assistance Sitting-balance support: No upper extremity supported;Feet supported Sitting balance-Leahy Scale: Fair     Standing balance support: No upper extremity supported;During functional activity (washing hands at sink) Standing balance-Leahy Scale: Fair                               Pertinent Vitals/Pain Pain Assessment: No/denies pain    Home Living Family/patient expects to be discharged to:: Unsure Living Arrangements: Parent Available Help at Discharge: Family;Available 24 hours/day Type of Home: House Home Access: Stairs to enter Entrance Stairs-Rails: Psychiatric nurse of Steps: 3 Home Layout: Multi-level;Able to live on main level with bedroom/bathroom Home Equipment: Tub bench      Prior Function Level of Independence: Independent  Hand Dominance   Dominant Hand: Right    Extremity/Trunk Assessment   Upper Extremity Assessment: Defer to OT evaluation (Gross weakness noted. Decreased light touch sens medial hand)            Lower Extremity Assessment: LLE deficits/detail   LLE Deficits / Details: >4/5 MMT with knee extension, hip flexion, ankle DF/PF, and hallux flex/ext.     Communication   Communication: No difficulties  Cognition Arousal/Alertness: Lethargic Behavior During Therapy: Impulsive Overall Cognitive Status: Within Functional Limits for tasks assessed                      General Comments General comments (skin integrity, edema, etc.): Reports feeling SOB - RN notified    Exercises General Exercises - Lower Extremity Ankle Circles/Pumps: AROM;Both;10 reps;Seated      Assessment/Plan    PT Assessment Patient needs continued PT services  PT Diagnosis Difficulty walking;Abnormality of gait;Hemiplegia non-dominant side   PT Problem List Decreased strength;Decreased range of motion;Decreased activity tolerance;Decreased balance;Decreased mobility;Decreased coordination;Decreased knowledge of use of DME;Decreased safety awareness;Decreased knowledge of precautions;Impaired sensation  PT Treatment Interventions DME instruction;Gait training;Stair training;Functional mobility training;Therapeutic activities;Therapeutic exercise;Balance training;Neuromuscular re-education;Patient/family education   PT Goals (Current goals can be found in the Care Plan section) Acute Rehab PT Goals Patient Stated Goal: Rehab PT Goal Formulation: With patient Time For Goal Achievement: 02/08/15 Potential to Achieve Goals: Good    Frequency Min 4X/week   Barriers to discharge        Co-evaluation               End of Session Equipment Utilized During Treatment: Gait belt Activity Tolerance: Patient tolerated treatment well Patient left: in chair;with call bell/phone within reach;with chair alarm set;with SCD's reapplied;with family/visitor present Nurse Communication: Mobility status;Other (comment);Precautions (Pt feeling SOB )         Time: 1443-1540 PT Time Calculation (min)  (ACUTE ONLY): 22 min   Charges:   PT Evaluation $Initial PT Evaluation Tier I: 1 Procedure     PT G CodesEllouise Newer 01/25/2015, 1:19 PM Elayne Snare, [place]

## 2015-01-26 ENCOUNTER — Inpatient Hospital Stay (HOSPITAL_COMMUNITY)
Admission: RE | Admit: 2015-01-26 | Discharge: 2015-02-01 | DRG: 056 | Disposition: A | Discharge status: Other Acute Inpatient Hospital | Payer: Medicare Other | Source: Intra-hospital | Attending: Physical Medicine & Rehabilitation | Admitting: Physical Medicine & Rehabilitation

## 2015-01-26 ENCOUNTER — Ambulatory Visit (HOSPITAL_COMMUNITY): Payer: Self-pay

## 2015-01-26 ENCOUNTER — Inpatient Hospital Stay (HOSPITAL_COMMUNITY): Payer: Medicare Other

## 2015-01-26 DIAGNOSIS — I69391 Dysphagia following cerebral infarction: Secondary | ICD-10-CM | POA: Diagnosis not present

## 2015-01-26 DIAGNOSIS — E118 Type 2 diabetes mellitus with unspecified complications: Secondary | ICD-10-CM

## 2015-01-26 DIAGNOSIS — E1165 Type 2 diabetes mellitus with hyperglycemia: Secondary | ICD-10-CM | POA: Diagnosis present

## 2015-01-26 DIAGNOSIS — Z9114 Patient's other noncompliance with medication regimen: Secondary | ICD-10-CM | POA: Diagnosis present

## 2015-01-26 DIAGNOSIS — I69390 Apraxia following cerebral infarction: Secondary | ICD-10-CM

## 2015-01-26 DIAGNOSIS — I69392 Facial weakness following cerebral infarction: Secondary | ICD-10-CM

## 2015-01-26 DIAGNOSIS — I69354 Hemiplegia and hemiparesis following cerebral infarction affecting left non-dominant side: Principal | ICD-10-CM

## 2015-01-26 DIAGNOSIS — I5023 Acute on chronic systolic (congestive) heart failure: Secondary | ICD-10-CM | POA: Diagnosis not present

## 2015-01-26 DIAGNOSIS — T45516A Underdosing of anticoagulants, initial encounter: Secondary | ICD-10-CM | POA: Diagnosis present

## 2015-01-26 DIAGNOSIS — I63411 Cerebral infarction due to embolism of right middle cerebral artery: Secondary | ICD-10-CM | POA: Diagnosis not present

## 2015-01-26 DIAGNOSIS — G8194 Hemiplegia, unspecified affecting left nondominant side: Secondary | ICD-10-CM | POA: Diagnosis not present

## 2015-01-26 DIAGNOSIS — I639 Cerebral infarction, unspecified: Secondary | ICD-10-CM | POA: Insufficient documentation

## 2015-01-26 DIAGNOSIS — R0602 Shortness of breath: Secondary | ICD-10-CM

## 2015-01-26 DIAGNOSIS — I255 Ischemic cardiomyopathy: Secondary | ICD-10-CM | POA: Diagnosis present

## 2015-01-26 DIAGNOSIS — I2510 Atherosclerotic heart disease of native coronary artery without angina pectoris: Secondary | ICD-10-CM | POA: Diagnosis present

## 2015-01-26 DIAGNOSIS — I69328 Other speech and language deficits following cerebral infarction: Secondary | ICD-10-CM | POA: Diagnosis not present

## 2015-01-26 DIAGNOSIS — I2582 Chronic total occlusion of coronary artery: Secondary | ICD-10-CM | POA: Diagnosis present

## 2015-01-26 DIAGNOSIS — E780 Pure hypercholesterolemia: Secondary | ICD-10-CM | POA: Diagnosis present

## 2015-01-26 DIAGNOSIS — R1312 Dysphagia, oropharyngeal phase: Secondary | ICD-10-CM | POA: Diagnosis present

## 2015-01-26 DIAGNOSIS — I6789 Other cerebrovascular disease: Secondary | ICD-10-CM

## 2015-01-26 DIAGNOSIS — Z91128 Patient's intentional underdosing of medication regimen for other reason: Secondary | ICD-10-CM | POA: Diagnosis present

## 2015-01-26 DIAGNOSIS — I10 Essential (primary) hypertension: Secondary | ICD-10-CM | POA: Diagnosis present

## 2015-01-26 DIAGNOSIS — I959 Hypotension, unspecified: Secondary | ICD-10-CM | POA: Diagnosis not present

## 2015-01-26 DIAGNOSIS — F17210 Nicotine dependence, cigarettes, uncomplicated: Secondary | ICD-10-CM | POA: Diagnosis present

## 2015-01-26 DIAGNOSIS — R4587 Impulsiveness: Secondary | ICD-10-CM | POA: Diagnosis present

## 2015-01-26 DIAGNOSIS — Z95810 Presence of automatic (implantable) cardiac defibrillator: Secondary | ICD-10-CM | POA: Diagnosis not present

## 2015-01-26 DIAGNOSIS — F419 Anxiety disorder, unspecified: Secondary | ICD-10-CM | POA: Diagnosis present

## 2015-01-26 DIAGNOSIS — G4733 Obstructive sleep apnea (adult) (pediatric): Secondary | ICD-10-CM | POA: Diagnosis present

## 2015-01-26 LAB — URINE MICROSCOPIC-ADD ON

## 2015-01-26 LAB — BASIC METABOLIC PANEL
Anion gap: 10 (ref 5–15)
BUN: 15 mg/dL (ref 6–23)
CO2: 26 mmol/L (ref 19–32)
Calcium: 9 mg/dL (ref 8.4–10.5)
Chloride: 99 mmol/L (ref 96–112)
Creatinine, Ser: 1.08 mg/dL (ref 0.50–1.35)
GFR calc Af Amer: 89 mL/min — ABNORMAL LOW (ref >90)
GFR calc non Af Amer: 77 mL/min — ABNORMAL LOW (ref >90)
Glucose, Bld: 249 mg/dL — ABNORMAL HIGH (ref 70–99)
Potassium: 3.9 mmol/L (ref 3.5–5.1)
Sodium: 135 mmol/L (ref 135–145)

## 2015-01-26 LAB — URINALYSIS, ROUTINE W REFLEX MICROSCOPIC
Bilirubin Urine: NEGATIVE
Glucose, UA: >1000 mg/dL — AB
Hgb urine dipstick: NEGATIVE
Ketones, ur: NEGATIVE mg/dL
Leukocytes, UA: NEGATIVE
Nitrite: NEGATIVE
Protein, ur: NEGATIVE mg/dL
Specific Gravity, Urine: 1.042 — ABNORMAL HIGH (ref 1.005–1.030)
Urobilinogen, UA: 1 mg/dL (ref 0.0–1.0)
pH: 5 (ref 5.0–8.0)

## 2015-01-26 LAB — GLUCOSE, CAPILLARY
Glucose-Capillary: 239 mg/dL — ABNORMAL HIGH (ref 70–99)
Glucose-Capillary: 307 mg/dL — ABNORMAL HIGH (ref 70–99)
Glucose-Capillary: 94 mg/dL (ref 70–99)
Glucose-Capillary: 303 mg/dL — ABNORMAL HIGH (ref 70–99)

## 2015-01-26 LAB — PROTIME-INR
INR: 1.1 (ref 0.00–1.49)
Prothrombin Time: 14.3 seconds (ref 11.6–15.2)

## 2015-01-26 LAB — HEMOGLOBIN A1C
Hgb A1c MFr Bld: 13.3 % — ABNORMAL HIGH (ref 4.8–5.6)
Mean Plasma Glucose: 335 mg/dL

## 2015-01-26 IMAGING — CR DG CHEST 1V PORT
1 series · 1 of 1 positions shown · non-contrast
Comparison: Portable exam [U1] hours compared to [DATE]

CLINICAL DATA: Shortness of breath, history chronic systolic heart
failure, ischemic cardiomyopathy, type 2 diabetes,
hypercholesterolemia, smoker

EXAM:
PORTABLE CHEST - 1 VIEW

[AP]
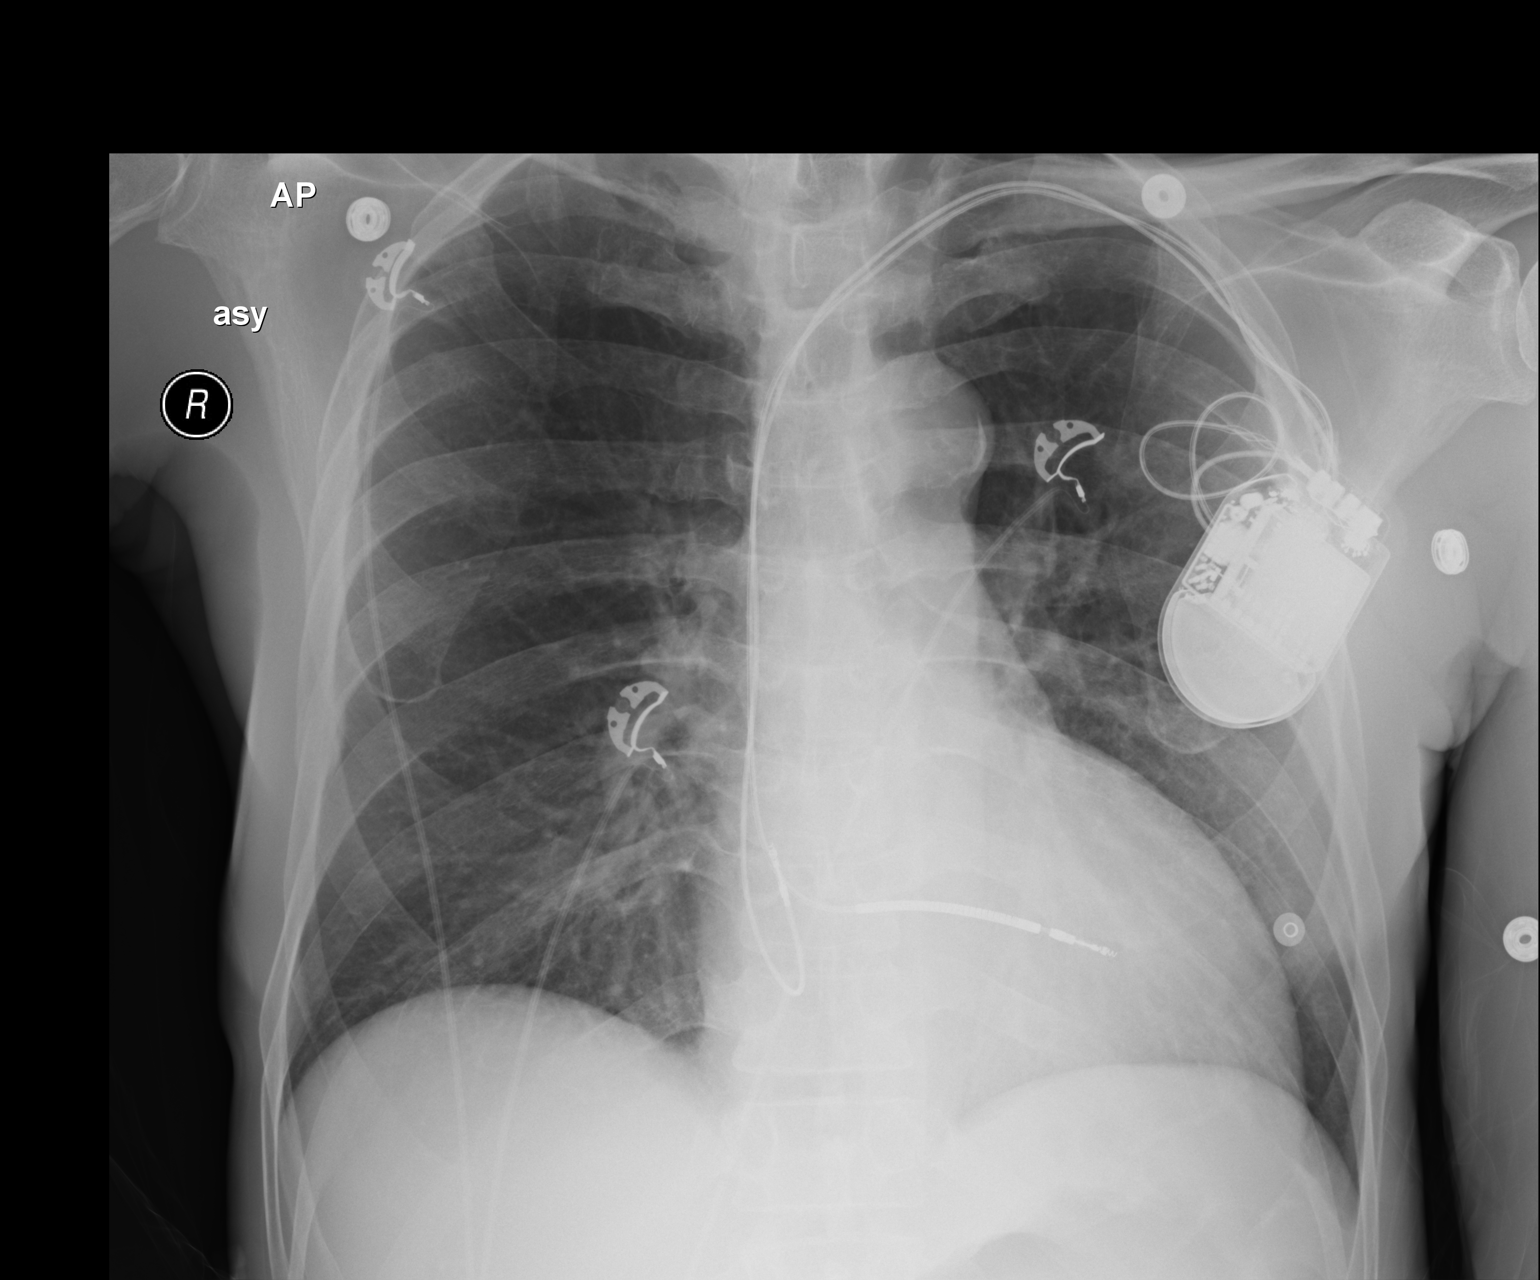

[1 of 1 positions shown; findings below may reference images not displayed]

FINDINGS: LEFT subclavian sequential pacemaker leads project over RIGHT atrium
and RIGHT ventricle.

Upper normal heart size.

Atherosclerotic calcification aorta.

Mediastinal contours and pulmonary vascularity normal.

Emphysematous changes without infiltrate, pleural effusion or
pneumothorax.

No acute osseous findings.
IMPRESSION: No acute abnormalities.

## 2015-01-26 MED ORDER — PROCHLORPERAZINE MALEATE 5 MG PO TABS
5.0000 mg | ORAL_TABLET | Freq: Four times a day (QID) | ORAL | Status: DC | PRN
  Filled 2015-01-26: qty 2

## 2015-01-26 MED ORDER — ALUM & MAG HYDROXIDE-SIMETH 200-200-20 MG/5ML PO SUSP
30.0000 mL | ORAL | Status: DC | PRN
Start: 2015-01-26 — End: 2015-02-01

## 2015-01-26 MED ORDER — WARFARIN SODIUM 10 MG PO TABS
10.0000 mg | ORAL_TABLET | Freq: Once | ORAL | Status: DC
  Filled 2015-01-26: qty 1

## 2015-01-26 MED ORDER — RESOURCE THICKENUP CLEAR PO POWD
ORAL | Status: DC | PRN
  Filled 2015-01-26: qty 125

## 2015-01-26 MED ORDER — ACETAMINOPHEN 325 MG PO TABS
325.0000 mg | ORAL_TABLET | ORAL | Status: DC | PRN
  Administered 2015-01-31 (×3): 650 mg via ORAL
  Filled 2015-01-26 (×3): qty 2

## 2015-01-26 MED ORDER — ATORVASTATIN CALCIUM 80 MG PO TABS: 80.0000 mg | ORAL_TABLET | Freq: Every day | ORAL | Status: DC

## 2015-01-26 MED ORDER — INSULIN GLARGINE 100 UNIT/ML SOLOSTAR PEN: 25.0000 [IU] | PEN_INJECTOR | Freq: Every day | SUBCUTANEOUS | Status: DC

## 2015-01-26 MED ORDER — PROCHLORPERAZINE EDISYLATE 5 MG/ML IJ SOLN
5.0000 mg | Freq: Four times a day (QID) | INTRAMUSCULAR | Status: DC | PRN
  Filled 2015-01-26: qty 2

## 2015-01-26 MED ORDER — ATORVASTATIN CALCIUM 20 MG PO TABS
20.0000 mg | ORAL_TABLET | Freq: Every day | ORAL | Status: DC
  Administered 2015-01-27 – 2015-01-31 (×5): 20 mg via ORAL
  Filled 2015-01-26 (×6): qty 1

## 2015-01-26 MED ORDER — INSULIN DETEMIR 100 UNIT/ML ~~LOC~~ SOLN: 10.0000 [IU] | Freq: Every day | SUBCUTANEOUS | Status: DC

## 2015-01-26 MED ORDER — TRAZODONE HCL 50 MG PO TABS: 25.0000 mg | ORAL_TABLET | Freq: Every evening | ORAL | Status: DC | PRN

## 2015-01-26 MED ORDER — WARFARIN - PHYSICIAN DOSING INPATIENT
Freq: Every day | Status: DC
  Administered 2015-01-27: 18:00:00

## 2015-01-26 MED ORDER — INSULIN ASPART 100 UNIT/ML ~~LOC~~ SOLN: SUBCUTANEOUS | Status: DC

## 2015-01-26 MED ORDER — INSULIN ASPART 100 UNIT/ML ~~LOC~~ SOLN
0.0000 [IU] | Freq: Three times a day (TID) | SUBCUTANEOUS | Status: DC
  Administered 2015-01-27: 15 [IU] via SUBCUTANEOUS
  Administered 2015-01-28: 4 [IU] via SUBCUTANEOUS
  Administered 2015-01-28: 3 [IU] via SUBCUTANEOUS
  Administered 2015-01-28 – 2015-01-29 (×2): 11 [IU] via SUBCUTANEOUS
  Administered 2015-01-29: 4 [IU] via SUBCUTANEOUS
  Administered 2015-01-30: 7 [IU] via SUBCUTANEOUS
  Administered 2015-01-30: 3 [IU] via SUBCUTANEOUS
  Administered 2015-01-31: 4 [IU] via SUBCUTANEOUS
  Administered 2015-01-31: 11 [IU] via SUBCUTANEOUS
  Administered 2015-01-31 – 2015-02-01 (×2): 4 [IU] via SUBCUTANEOUS

## 2015-01-26 MED ORDER — WARFARIN SODIUM 5 MG PO TABS
10.0000 mg | ORAL_TABLET | Freq: Once | ORAL | Status: DC
  Administered 2015-01-26: 10 mg via ORAL
  Filled 2015-01-26: qty 2

## 2015-01-26 MED ORDER — FLEET ENEMA 7-19 GM/118ML RE ENEM: 1.0000 | ENEMA | Freq: Once | RECTAL | Status: AC | PRN

## 2015-01-26 MED ORDER — ASPIRIN EC 81 MG PO TBEC
81.0000 mg | DELAYED_RELEASE_TABLET | Freq: Every day | ORAL | Status: DC
  Administered 2015-01-27 – 2015-01-30 (×4): 81 mg via ORAL
  Filled 2015-01-26 (×5): qty 1

## 2015-01-26 MED ORDER — CARVEDILOL 3.125 MG PO TABS: 3.1250 mg | ORAL_TABLET | Freq: Two times a day (BID) | ORAL | Status: DC

## 2015-01-26 MED ORDER — FUROSEMIDE 20 MG PO TABS
20.0000 mg | ORAL_TABLET | ORAL | Status: DC
  Filled 2015-01-26: qty 1

## 2015-01-26 MED ORDER — INSULIN GLARGINE 100 UNIT/ML ~~LOC~~ SOLN
10.0000 [IU] | Freq: Every day | SUBCUTANEOUS | Status: DC
  Administered 2015-01-26 – 2015-01-27 (×2): 10 [IU] via SUBCUTANEOUS
  Filled 2015-01-26 (×3): qty 0.1

## 2015-01-26 MED ORDER — PROCHLORPERAZINE 25 MG RE SUPP
12.5000 mg | Freq: Four times a day (QID) | RECTAL | Status: DC | PRN
  Filled 2015-01-26: qty 1

## 2015-01-26 MED ORDER — BISACODYL 10 MG RE SUPP
10.0000 mg | Freq: Every day | RECTAL | Status: DC | PRN
Start: 2015-01-26 — End: 2015-02-01

## 2015-01-26 MED ORDER — INSULIN ASPART 100 UNIT/ML ~~LOC~~ SOLN
5.0000 [IU] | Freq: Three times a day (TID) | SUBCUTANEOUS | Status: DC
  Administered 2015-01-27 – 2015-02-01 (×16): 5 [IU] via SUBCUTANEOUS

## 2015-01-26 MED ORDER — ASPIRIN 81 MG PO TBEC
81.0000 mg | DELAYED_RELEASE_TABLET | Freq: Every day | ORAL | Status: DC
Start: 2015-01-26 — End: 2015-02-01

## 2015-01-26 MED ORDER — GUAIFENESIN-DM 100-10 MG/5ML PO SYRP
5.0000 mL | ORAL_SOLUTION | Freq: Four times a day (QID) | ORAL | Status: DC | PRN
  Administered 2015-01-31: 10 mL via ORAL
  Filled 2015-01-26: qty 10

## 2015-01-26 MED ORDER — SENNOSIDES-DOCUSATE SODIUM 8.6-50 MG PO TABS
1.0000 | ORAL_TABLET | Freq: Every day | ORAL | Status: DC
  Administered 2015-01-26 – 2015-01-29 (×4): 1 via ORAL
  Filled 2015-01-26 (×5): qty 1

## 2015-01-26 NOTE — H&P (Signed)
Physical Medicine and Rehabilitation Admission H&P    Chief Complaint  Patient presents with  . Left sided weakness, left facial droop and slurred speech   HPI: Dennis [name] is a 54 y.o. male with history of chronic systolic HF, OSA, DM type 2, Left ventricular non-compaction on chronic coumadin, s/p ICD, medication non-compliance; who was found down by family with slurred speech, left facial droop and left sided weakness. Blood sugar- 536 and UDS positive for cocaine. CT head with early signs of R-MCA infarct.  Swallow evaluation done revealing mild oropharyngeal dysphagia and patient placed on DIII, nectar liquids. 2D echo done today.  Cognitive evaluation done revealing impulsivity as well as subtle short term memory deficits. CTA head/neck  done revealing evolving anterior division right MCA infarct.  Dr. Erlinda Hong recommended resuming  Coumadin for  embolic stroke due to low EF. He has had complaints of SOB and was treated with dose of IV lasix. CXR with NAD.  Patient with resultant  Left hemiparesis, left inattention, fluctuating bouts of lethargy as well as poor insight/awareness.  CIR recommended by MD and Rehab team and patient admitted today.     Review of Systems  Constitutional: Negative for fever and chills.  HENT: Negative for hearing loss.   Eyes: Negative for blurred vision, double vision and pain.  Cardiovascular: Negative for chest pain.  Gastrointestinal: Negative for vomiting.  Genitourinary: Negative for dysuria and flank pain.  Musculoskeletal: Negative for myalgias.  Neurological: Negative for headaches.      Past Medical History  Diagnosis Date  . Chronic systolic heart failure     a) Mixed ICM/NICM b) RHC (05/2014): RA 2, RV 19/2/3, PA 22/14 (18), PCWP 6, Fick CO/CI: 5.2 / 2.7, PVR 2.3 WU, PA 60% and 64% c) ECHO (05/2014): EF 20-25%, diff HK, akinesis entireanteroseptal myocardium, triv AI, mod MR, LA mod/sev dilated  . Automatic implantable  cardioverter-defibrillator in situ   . Heart murmur   . Sleep apnea     "cleared after T&A"  . Left ventricular noncompaction   . Ischemic cardiomyopathy     a) Coronary angiography (12/2008) at [facility]: Lmain: nl, LAD mid 100% stenosis with left to left and right-to-left collaterals to the distal LAD; Lcx: nl, RCA nl.    . Type II diabetes mellitus   . High cholesterol   . Pneumonia 1988  . Drug abuse and dependence   . CHF (congestive heart failure)     Past Surgical History  Procedure Laterality Date  . Forearm fracture surgery Left 1980  . Cardiac defibrillator placement  03/2011  . Cholecystectomy  1994  . Right heart catheterization N/A 05/30/2014    Procedure: RIGHT HEART CATH;  Surgeon: Jolaine Artist, MD;  Location: [facility] CATH LAB;  Service: Cardiovascular;  Laterality: N/A;  . Fracture surgery    . Cardiac catheterization  05/2014  . Tonsillectomy and adenoidectomy  ~ 1998    Family History  Problem Relation Age of Onset  . Diabetes Mother   . Heart disease Mother   . Diabetes Father   . Prostate cancer Father   . Heart disease Father   . Diabetes Brother   . Diabetes Brother    Social History:  reports that he has been smoking Cigarettes.  He has a 3.1 pack-year smoking history. He has never used smokeless tobacco. He reports that he drinks alcohol. He reports that he does not use illicit drugs.    Allergies: No Known Allergies    Medications  Prior to Admission  Medication Sig Dispense Refill  . acetaminophen (TYLENOL) 500 MG tablet Take 1,000 mg by mouth every 6 (six) hours as needed for moderate pain.    Marland Kitchen digoxin (LANOXIN) 0.125 MG tablet Take 1 tablet (0.125 mg total) by mouth daily. 30 tablet 3  . furosemide (LASIX) 40 MG tablet Take 1 tablet (40 mg total) by mouth every other day. 45 tablet 3  . traMADol (ULTRAM) 50 MG tablet Take 1 tablet (50 mg total) by mouth every 12 (twelve) hours as needed for moderate pain. 14 tablet 0  . warfarin (COUMADIN) 5 MG  tablet Take 10 mg by mouth daily. Patient states he takes 52m every day per patient. Has not taken in two weeks per patient    . [DISCONTINUED] carvedilol (COREG) 3.125 MG tablet Take 1 tablet (3.125 mg total) by mouth 2 (two) times daily with a meal. 60 tablet 3  . [DISCONTINUED] Insulin Glargine (LANTUS) 100 UNIT/ML Solostar Pen Inject 32 Units into the skin daily at 10 pm. 15 mL 11  . glucose blood (ONETOUCH VERIO) test strip Use as instructed 100 each 12  . metFORMIN (GLUCOPHAGE) 500 MG tablet Take 1 tablet (500 mg total) by mouth 2 (two) times daily with a meal. (Patient not taking: Reported on 12/03/2014) 180 tablet 3  . QC PEN NEEDLES 31G X 6 MM MISC 1 each by Other route See admin instructions. Use daily with insulin.    .Marland Kitchenwarfarin (COUMADIN) 5 MG tablet Take as directed by Coumadin clinic (Patient not taking: Reported on 12/03/2014) 50 tablet 0  . [DISCONTINUED] atorvastatin (LIPITOR) 40 MG tablet Take 1 tablet (40 mg total) by mouth daily at 6 PM. (Patient not taking: Reported on 01/24/2015) 30 tablet 6    Home: HMadeiraexpects to be discharged to:: Inpatient rehab Living Arrangements: Parent Available Help at Discharge: Family, Available 24 hours/day Type of Home: House Home Access: Stairs to enter [employer] Steps: 1 step into back entry Entrance Stairs-Rails: None Home Layout: Multi-level, 1/2 bath on main level, Bed/bath upstairs Alternate Level Stairs-Number of Steps: 12 Alternate Level Stairs-Rails: Right, Left, Can reach both Home Equipment: Tub bench  Lives With: Family   Functional History: Prior Function Level of Independence: Independent Comments: Pt reports he drives and is independent with IADLs   Functional Status:  Mobility: Bed Mobility Overal bed mobility: Needs Assistance Bed Mobility: Supine to Sit Supine to sit: Mod assist General bed mobility comments: Pt requires assist to move LEs to EOB and assist to lift shoulder  from bed  Transfers Overall transfer level: Needs assistance Equipment used: None Transfers: Sit to/from Stand, Stand Pivot Transfers Sit to Stand: Min assist Stand pivot transfers: Min assist General transfer comment: Pt requires min A for balance and safety.  he is impulsive  Ambulation/Gait Ambulation/Gait assistance: Min assist Ambulation Distance (Feet): 20 Feet Assistive device: 1 person hand held assist Gait Pattern/deviations: Step-to pattern, Decreased step length - right, Decreased step length - left, Decreased stance time - left, Decreased stride length, Decreased dorsiflexion - left, Decreased weight shift to left, Shuffle Gait velocity: decreased Gait velocity interpretation: Below normal speed for age/gender General Gait Details: Min assist for balance on Pts left side. Mild buckling noted at times of Lt knee however able to self correct. Tends to drag LLE behind him. Cues for hip flexion and Lt ankle clearance. Able to stand to urinate with min guard assist. Picked up an item on the floor with min  assist for balance. Somewhat impulsive, needs cues to take his time and concentrate on task at hand due to some lack of awareness of deficits.    ADL: ADL Overall ADL's : Needs assistance/impaired Eating/Feeding: Supervision/ safety, Set up, Sitting Grooming: Wash/dry hands, Wash/dry face, Oral care, Brushing hair, Minimal assistance, Standing Upper Body Bathing: Moderate assistance, Sitting Lower Body Bathing: Moderate assistance, Sit to/from stand Upper Body Dressing : Moderate assistance, Sitting Lower Body Dressing: Moderate assistance, Sit to/from stand Toilet Transfer: Minimal assistance, Ambulation, Comfort height toilet, Grab bars Toileting- Clothing Manipulation and Hygiene: Moderate assistance, Sit to/from stand Functional mobility during ADLs: Minimal assistance General ADL Comments: Pt is very impulsive.  He is lethargic with difficulty sustaining arousal, but does  make significant effort to stay awake.  He is slow to initiate activity   Cognition: Cognition Overall Cognitive Status: Difficult to assess Arousal/Alertness: Awake/alert Orientation Level: Oriented X4 Attention: Selective Selective Attention: Appears intact Memory: Appears intact (during formal testing, ? slight difficulty with basic carryo) Awareness: Appears intact Problem Solving: Appears intact Safety/Judgment: Appears intact Cognition Arousal/Alertness: Lethargic Behavior During Therapy: Impulsive, Flat affect Overall Cognitive Status: Difficult to assess Difficult to assess due to: Level of arousal   Blood pressure 95/72, pulse 96, temperature 97.6 F (36.4 C), temperature source Oral, resp. rate 18, height 6' (1.829 m), weight 67.223 kg (148 lb 3.2 oz), SpO2 96 %. Physical Exam  Constitutional: He appears well-developed and well-nourished. No distress.  HENT:  Head: Normocephalic and atraumatic.  Right Ear: External ear normal.  Left Ear: External ear normal.  Eyes: Conjunctivae and EOM are normal. Pupils are equal, round, and reactive to light.  Neck: No tracheal deviation present. No thyromegaly present.  Cardiovascular: Normal rate and regular rhythm.  Exam reveals no gallop and no friction rub.   No murmur heard. Respiratory: No respiratory distress. He has no wheezes. He has no rales.  GI: Soft. Bowel sounds are normal. He exhibits no distension. There is no tenderness.  Neurological:  Motor strength is 5/5 in the deltoid biceps triceps grip hip flexor and knee extensor ankle dorsiflexor Left upper extremity 4/5 in the deltoid, biceps, triceps, grip, hip flexor, knee extensor, ankle dorsiflexor and plantar flexor. Sensation intact to light touch in bilateral upper and lower limbs\ mild left pronator drift He exhibits mild left neglect   Skin: He is not diaphoretic.  Psychiatric: He has a normal mood and affect.    Results for orders placed or performed  during the hospital encounter of 01/24/15 (from the past 48 hour(s))  CBG monitoring, ED     Status: Abnormal   Collection Time: 01/24/15  4:05 PM  Result Value Ref Range   Glucose-Capillary 367 (H) 70 - 99 mg/dL  Glucose, capillary     Status: Abnormal   Collection Time: 01/24/15  7:56 PM  Result Value Ref Range   Glucose-Capillary 281 (H) 70 - 99 mg/dL   Comment 1 Notify RN   Brain natriuretic peptide     Status: Abnormal   Collection Time: 01/24/15  8:05 PM  Result Value Ref Range   B Natriuretic Peptide 166.1 (H) 0.0 - 100.0 pg/mL  Hemoglobin A1c     Status: Abnormal   Collection Time: 01/24/15  8:05 PM  Result Value Ref Range   Hgb A1c MFr Bld 13.3 (H) 4.8 - 5.6 %    Comment: (NOTE)         Pre-diabetes: 5.7 - 6.4         Diabetes: >  6.4         Glycemic control for adults with diabetes: <7.0    Mean Plasma Glucose 335 mg/dL    Comment: (NOTE) Performed At: [facility] [place], [place] [id number] Lindon Romp MD [id number]   Glucose, capillary     Status: Abnormal   Collection Time: 01/24/15 11:50 PM  Result Value Ref Range   Glucose-Capillary 198 (H) 70 - 99 mg/dL  Glucose, capillary     Status: Abnormal   Collection Time: 01/25/15  4:13 AM  Result Value Ref Range   Glucose-Capillary 62 (L) 70 - 99 mg/dL  Glucose, capillary     Status: Abnormal   Collection Time: 01/25/15  4:35 AM  Result Value Ref Range   Glucose-Capillary 178 (H) 70 - 99 mg/dL   Comment 1 Notify RN   Lipid panel     Status: Abnormal   Collection Time: 01/25/15  5:35 AM  Result Value Ref Range   Cholesterol 197 0 - 200 mg/dL   Triglycerides 42 <150 mg/dL   HDL 73 >39 mg/dL   Total CHOL/HDL Ratio 2.7 RATIO   VLDL 8 0 - 40 mg/dL   LDL Cholesterol 116 (H) 0 - 99 mg/dL    Comment:        Total Cholesterol/HDL:CHD Risk Coronary Heart Disease Risk Table                     Men   Women  1/2 Average Risk   3.4   3.3  Average Risk       5.0   4.4  2 X Average Risk    9.6   7.1  3 X Average Risk  23.4   11.0        Use the calculated Patient Ratio above and the CHD Risk Table to determine the patient's CHD Risk.        ATP III CLASSIFICATION (LDL):  <100     mg/dL   Optimal  100-129  mg/dL   Near or Above                    Optimal  130-159  mg/dL   Borderline  160-189  mg/dL   High  >190     mg/dL   Very High   Basic metabolic panel     Status: Abnormal   Collection Time: 01/25/15  5:35 AM  Result Value Ref Range   Sodium 140 135 - 145 mmol/L   Potassium 4.1 3.5 - 5.1 mmol/L   Chloride 104 96 - 112 mmol/L   CO2 25 19 - 32 mmol/L   Glucose, Bld 169 (H) 70 - 99 mg/dL   BUN 16 6 - 23 mg/dL   Creatinine, Ser 0.88 0.50 - 1.35 mg/dL   Calcium 9.5 8.4 - 10.5 mg/dL   GFR calc non Af Amer >90 >90 mL/min   GFR calc Af Amer >90 >90 mL/min    Comment: (NOTE) The eGFR has been calculated using the CKD EPI equation. This calculation has not been validated in all clinical situations. eGFR's persistently <90 mL/min signify possible Chronic Kidney Disease.    Anion gap 11 5 - 15  Glucose, capillary     Status: Abnormal   Collection Time: 01/25/15  7:47 AM  Result Value Ref Range   Glucose-Capillary 145 (H) 70 - 99 mg/dL  Glucose, capillary     Status: Abnormal   Collection Time: 01/25/15 12:22 PM  Result Value Ref Range   Glucose-Capillary 280 (H) 70 - 99 mg/dL  Glucose, capillary     Status: Abnormal   Collection Time: 01/25/15  5:16 PM  Result Value Ref Range   Glucose-Capillary 173 (H) 70 - 99 mg/dL  Glucose, capillary     Status: Abnormal   Collection Time: 01/25/15 10:12 PM  Result Value Ref Range   Glucose-Capillary 171 (H) 70 - 99 mg/dL   Comment 1 Notify RN    Comment 2 Document in Chart   Glucose, capillary     Status: Abnormal   Collection Time: 01/26/15  5:30 AM  Result Value Ref Range   Glucose-Capillary 239 (H) 70 - 99 mg/dL   Comment 1 Notify RN   Basic metabolic panel     Status: Abnormal   Collection Time: 01/26/15  6:28  AM  Result Value Ref Range   Sodium 135 135 - 145 mmol/L   Potassium 3.9 3.5 - 5.1 mmol/L   Chloride 99 96 - 112 mmol/L   CO2 26 19 - 32 mmol/L   Glucose, Bld 249 (H) 70 - 99 mg/dL   BUN 15 6 - 23 mg/dL   Creatinine, Ser 1.08 0.50 - 1.35 mg/dL   Calcium 9.0 8.4 - 10.5 mg/dL   GFR calc non Af Amer 77 (L) >90 mL/min   GFR calc Af Amer 89 (L) >90 mL/min    Comment: (NOTE) The eGFR has been calculated using the CKD EPI equation. This calculation has not been validated in all clinical situations. eGFR's persistently <90 mL/min signify possible Chronic Kidney Disease.    Anion gap 10 5 - 15  Protime-INR     Status: None   Collection Time: 01/26/15  6:28 AM  Result Value Ref Range   Prothrombin Time 14.3 11.6 - 15.2 seconds   INR 1.10 0.00 - 1.49  Glucose, capillary     Status: Abnormal   Collection Time: 01/26/15 11:30 AM  Result Value Ref Range   Glucose-Capillary 307 (H) 70 - 99 mg/dL   Ct Angio Head W/cm &/or Wo Cm  01/25/2015   CLINICAL DATA:  54 year old male stroke patient with left side weakness and difficulty swallowing. Not a candidate for thrombolysis. Cannot have MRI. Initial encounter.  EXAM: CT ANGIOGRAPHY HEAD AND NECK  TECHNIQUE: Multidetector CT imaging of the head and neck was performed using the standard protocol during bolus administration of intravenous contrast. Multiplanar CT image reconstructions and MIPs were obtained to evaluate the vascular anatomy. Carotid stenosis measurements (when applicable) are obtained utilizing NASCET criteria, using the distal internal carotid diameter as the denominator.  CONTRAST:  56m OMNIPAQUE IOHEXOL 350 MG/ML SOLN  COMPARISON:  Head CT without contrast 01/24/2015.  FINDINGS: CT HEAD  Brain: Evolved right MCA anterior division infarct with progressive gray and white matter hypodensity corresponding to the insula and anterior operculum, in the area questioned by CT yesterday. No acute intracranial hemorrhage identified. No associated  mass effect. No ventriculomegaly. Stable gray-white matter differentiation elsewhere.  Calvarium and skull base: Negative.  Paranasal sinuses: Visualized paranasal sinuses and mastoids are clear.  Orbits: Negative.  CTA NECK  Other neck: Left chest cardiac pacemaker with mild streak artifact. Paraseptal and centrilobular emphysema. No superior mediastinal lymphadenopathy.  Thyroid, larynx, pharynx, parapharyngeal spaces, retropharyngeal space and sublingual space are within normal limits.  Diffuse increased attenuation of subcutaneous fat and superficial soft tissues such that there is portal in a shin of the submandibular and parotid glands, as well as the muscle  layers. No cervical lymphadenopathy identified.  Skeleton: Degenerative changes in the cervical spine. No acute osseous abnormality identified.  Aortic arch: 3 vessel arch configuration. Mild distal arch calcified plaque. No great vessel origin stenosis.  Right carotid system: Negative right CCA. Widely patent right carotid bifurcation with minimal calcified plaque (series 504, image 99). Negative cervical right ICA.  Left carotid system: Negative left CCA. Negative left carotid bifurcation. Negative cervical left ICA.  Vertebral arteries:  No proximal right subclavian artery stenosis. Paravertebral venous contrast reflux obscures the proximal right vertebral artery and its origin. The right V2 segment is normal. Negative right vertebral artery at the skullbase.  No proximal left subclavian artery stenosis. Non dominant appearing left vertebral artery has a normal origin, and is normal to the skullbase.  CTA HEAD  Posterior circulation: Mildly dominant distal right vertebral artery. Normal PICA origins. Normal vertebrobasilar junction. No basilar artery stenosis. Normal SCA and left PCA origins. Fetal type right PCA origin. Left posterior communicating artery is diminutive or absent. Bilateral PCA branches are within normal limits.  Anterior circulation:  Negative left ICA siphon. Normal left ophthalmic artery origin. Negative right ICA siphon. Normal right ophthalmic and posterior communicating artery origins. Normal carotid termini. Normal MCA and ACA origins. Diminutive or absent anterior communicating artery. Bilateral ACA branches are within normal limits. Left MCA branches are within normal limits.  Right MCA origin is normal and M1 segment is patent. Right MCA bifurcation is patent. There is then filling defect and irregularity of proximal right MCA M2 branches, with subtotal loss of enhancement of the proximal anterior sylvian division best seen on series 506, image 16. Despite this, there is preserved more distal right M2 and M3 enhancement. No major right MCA branch occlusion is identified.  Venous sinuses: Negative.  Anatomic variants: Mildly dominant right vertebral artery. Fetal type right PCA origin.  Delayed phase: No abnormal enhancement identified.  IMPRESSION: 1. Evolving anterior division right MCA infarct. No hemorrhage or mass effect. 2. Associated filling defect just beyond the right MCA bifurcation affecting the proximal right M2 branches compatible with thromboembolic disease. Despite this, no major right MCA branch occlusion is identified. 3. Otherwise negative intracranial CTA. 4. Minimal atherosclerosis in the neck and aortic arch. 5. Emphysema.   Electronically Signed   By: Genevie Ann M.D.   On: 01/25/2015 14:31    Dg Chest Port 1 View  01/26/2015   CLINICAL DATA:  Shortness of breath, history chronic systolic heart failure, ischemic cardiomyopathy, type 2 diabetes, hypercholesterolemia, smoker  EXAM: PORTABLE CHEST - 1 VIEW  COMPARISON:  Portable exam 0826 hours compared to 12/03/2014  FINDINGS: LEFT subclavian sequential pacemaker leads project over RIGHT atrium and RIGHT ventricle.  Upper normal heart size.  Atherosclerotic calcification aorta.  Mediastinal contours and pulmonary vascularity normal.  Emphysematous changes without  infiltrate, pleural effusion or pneumothorax.  No acute osseous findings.  IMPRESSION: No acute abnormalities.   Electronically Signed   By: Lavonia Dana M.D.   On: 01/26/2015 08:32       Medical Problem List and Plan: 1. Functional deficits secondary to embolic right MCA infarct 2.  DVT Prophylaxis/Anticoagulation: Pharmaceutical: Coumadin 3. Pain Management: Tylenol prn 4. Mood: Appears to have anxiety with occasional hypoventilation. LCSW to follow for evaluation and support.  5. Neuropsych: This patient is intermittently capable of making decisions on his own behalf.  Encourage compliance with all medications.  6. Skin/Wound Care:  Routine pressure relief measures.  7. Fluids/Electrolytes/Nutrition:  Monitor I/O. Check lytes in  am. Encourage nectar liquids.  8. Chronic Systolic CHF: Low salt diet. Monitor weights daily.  Lasix every other day.  9. DM type 2:  Monitor BS with achs checks.      Post Admission Physician Evaluation: 1. Functional deficits secondary  to embolic right MCA infarct. 2. Patient is admitted to receive collaborative, interdisciplinary care between the physiatrist, rehab nursing staff, and therapy team. 3. Patient's level of medical complexity and substantial therapy needs in context of that medical necessity cannot be provided at a lesser intensity of care such as a SNF. 4. Patient has experienced substantial functional loss from his/her baseline which was documented above under the "Functional History" and "Functional Status" headings.  Judging by the patient's diagnosis, physical exam, and functional history, the patient has potential for functional progress which will result in measurable gains while on inpatient rehab.  These gains will be of substantial and practical use upon discharge  in facilitating mobility and self-care at the household level. 5. Physiatrist will provide 24 hour management of medical needs as well as oversight of the therapy plan/treatment  and provide guidance as appropriate regarding the interaction of the two. 6. 24 hour rehab nursing will assist with bladder management, bowel management, safety, skin/wound care, disease management, medication administration, pain management and patient education  and help integrate therapy concepts, techniques,education, etc. 7. PT will assess and treat for/with: Lower extremity strength, range of motion, stamina, balance, functional mobility, safety, adaptive techniques and equipment, NMR, visual perceptual awareness, ego support.   Goals are: mod I to supervision. 8. OT will assess and treat for/with: ADL's, functional mobility, safety, upper extremity strength, adaptive techniques and equipment, NMR, visual perceptual awareness, community reintegration, ego support.   Goals are: mod I to supervision. Therapy may proceed with showering this patient. 9. SLP will assess and treat for/with: cognition, communication.  Goals are: mod I to supervision. 10. Case Management and Social Worker will assess and treat for psychological issues and discharge planning. 11. Team conference will be held weekly to assess progress toward goals and to determine barriers to discharge. 12. Patient will receive at least 3 hours of therapy per day at least 5 days per week. 13. ELOS: 11-18 days       14. Prognosis:  excellent     Meredith Staggers, MD, [facility] 01/26/2015   01/26/2015

## 2015-01-26 NOTE — Interval H&P Note (Signed)
Shaw Dobek was admitted today to Inpatient Rehabilitation with the diagnosis of right MCA infarct.  The patient's history has been reviewed, patient examined, and there is no change in status.  Patient continues to be appropriate for intensive inpatient rehabilitation.  I have reviewed the patient's chart and labs.  Questions were answered to the patient's satisfaction.  SWARTZ,ZACHARY T 01/26/2015, 7:05 PM

## 2015-01-26 NOTE — Progress Notes (Signed)
Inpatient Diabetes Program Recommendations  AACE/ADA: New Consensus Statement on Inpatient Glycemic Control (2013)  Target Ranges:  Prepandial:   less than 140 mg/dL      Peak postprandial:   less than 180 mg/dL (1-2 hours)      Critically ill patients:  140 - 180 mg/dL    Results for JHOAN, SCHMIEDER (MRN [id number]) as of 01/26/2015 10:05  Ref. Range 01/25/2015 07:47 01/25/2015 12:22 01/25/2015 17:16 01/25/2015 22:12 01/26/2015 05:30  Glucose-Capillary Latest Ref Range: 70-99 mg/dL 145 (H) 280 (H) 173 (H) 171 (H) 239 (H)   Reason for assessment: elevated CBG  Diabetes history: Type 2 Outpatient Diabetes medications: Lantus 32 units q day, Metformin 1000mg /day Current orders for Inpatient glycemic control: Levemir 10 units q day, Novolog 0-20 units tid  Please consider increasing the Novolog resistant scale from tid to tid AND hs.  Consider increasing the Levemir insulin to 20 units daily (67kg x 0.3= 20 units) based on fasting blood sugar.   Gentry Fitz, RN, BA, MHA, CDE Diabetes Coordinator Inpatient Diabetes Program  [phone] (Team Pager) [phone] [facility]) 01/25/2015 1:07 PM

## 2015-01-26 NOTE — H&P (View-Only) (Signed)
Physical Medicine and Rehabilitation Admission H&P    Chief Complaint  Patient presents with  . Left sided weakness, left facial droop and slurred speech   HPI: Dennis [name] is a 54 y.o. male with history of chronic systolic HF, OSA, DM type 2, Left ventricular non-compaction on chronic coumadin, s/p ICD, medication non-compliance; who was found down by family with slurred speech, left facial droop and left sided weakness. Blood sugar- 536 and UDS positive for cocaine. CT head with early signs of R-MCA infarct.  Swallow evaluation done revealing mild oropharyngeal dysphagia and patient placed on DIII, nectar liquids. 2D echo done today.  Cognitive evaluation done revealing impulsivity as well as subtle short term memory deficits. CTA head/neck  done revealing evolving anterior division right MCA infarct.  Dr. Erlinda Hong recommended resuming  Coumadin for  embolic stroke due to low EF. He has had complaints of SOB and was treated with dose of IV lasix. CXR with NAD.  Patient with resultant  Left hemiparesis, left inattention, fluctuating bouts of lethargy as well as poor insight/awareness.  CIR recommended by MD and Rehab team and patient admitted today.     Review of Systems  Constitutional: Negative for fever and chills.  HENT: Negative for hearing loss.   Eyes: Negative for blurred vision, double vision and pain.  Cardiovascular: Negative for chest pain.  Gastrointestinal: Negative for vomiting.  Genitourinary: Negative for dysuria and flank pain.  Musculoskeletal: Negative for myalgias.  Neurological: Negative for headaches.      Past Medical History  Diagnosis Date  . Chronic systolic heart failure     a) Mixed ICM/NICM b) RHC (05/2014): RA 2, RV 19/2/3, PA 22/14 (18), PCWP 6, Fick CO/CI: 5.2 / 2.7, PVR 2.3 WU, PA 60% and 64% c) ECHO (05/2014): EF 20-25%, diff HK, akinesis entireanteroseptal myocardium, triv AI, mod MR, LA mod/sev dilated  . Automatic implantable  cardioverter-defibrillator in situ   . Heart murmur   . Sleep apnea     "cleared after T&A"  . Left ventricular noncompaction   . Ischemic cardiomyopathy     a) Coronary angiography (12/2008) at [facility]: Lmain: nl, LAD mid 100% stenosis with left to left and right-to-left collaterals to the distal LAD; Lcx: nl, RCA nl.    . Type II diabetes mellitus   . High cholesterol   . Pneumonia 1988  . Drug abuse and dependence   . CHF (congestive heart failure)     Past Surgical History  Procedure Laterality Date  . Forearm fracture surgery Left 1980  . Cardiac defibrillator placement  03/2011  . Cholecystectomy  1994  . Right heart catheterization N/A 05/30/2014    Procedure: RIGHT HEART CATH;  Surgeon: Jolaine Artist, MD;  Location: [facility] CATH LAB;  Service: Cardiovascular;  Laterality: N/A;  . Fracture surgery    . Cardiac catheterization  05/2014  . Tonsillectomy and adenoidectomy  ~ 1998    Family History  Problem Relation Age of Onset  . Diabetes Mother   . Heart disease Mother   . Diabetes Father   . Prostate cancer Father   . Heart disease Father   . Diabetes Brother   . Diabetes Brother    Social History:  reports that he has been smoking Cigarettes.  He has a 3.1 pack-year smoking history. He has never used smokeless tobacco. He reports that he drinks alcohol. He reports that he does not use illicit drugs.    Allergies: No Known Allergies    Medications  Prior to Admission  Medication Sig Dispense Refill  . acetaminophen (TYLENOL) 500 MG tablet Take 1,000 mg by mouth every 6 (six) hours as needed for moderate pain.    Marland Kitchen digoxin (LANOXIN) 0.125 MG tablet Take 1 tablet (0.125 mg total) by mouth daily. 30 tablet 3  . furosemide (LASIX) 40 MG tablet Take 1 tablet (40 mg total) by mouth every other day. 45 tablet 3  . traMADol (ULTRAM) 50 MG tablet Take 1 tablet (50 mg total) by mouth every 12 (twelve) hours as needed for moderate pain. 14 tablet 0  . warfarin (COUMADIN) 5 MG  tablet Take 10 mg by mouth daily. Patient states he takes 7m every day per patient. Has not taken in two weeks per patient    . [DISCONTINUED] carvedilol (COREG) 3.125 MG tablet Take 1 tablet (3.125 mg total) by mouth 2 (two) times daily with a meal. 60 tablet 3  . [DISCONTINUED] Insulin Glargine (LANTUS) 100 UNIT/ML Solostar Pen Inject 32 Units into the skin daily at 10 pm. 15 mL 11  . glucose blood (ONETOUCH VERIO) test strip Use as instructed 100 each 12  . metFORMIN (GLUCOPHAGE) 500 MG tablet Take 1 tablet (500 mg total) by mouth 2 (two) times daily with a meal. (Patient not taking: Reported on 12/03/2014) 180 tablet 3  . QC PEN NEEDLES 31G X 6 MM MISC 1 each by Other route See admin instructions. Use daily with insulin.    .Marland Kitchenwarfarin (COUMADIN) 5 MG tablet Take as directed by Coumadin clinic (Patient not taking: Reported on 12/03/2014) 50 tablet 0  . [DISCONTINUED] atorvastatin (LIPITOR) 40 MG tablet Take 1 tablet (40 mg total) by mouth daily at 6 PM. (Patient not taking: Reported on 01/24/2015) 30 tablet 6    [place] Inpatient rehab Living Arrangements: Parent Available Help at Discharge: Family, Available 24 hours/day Type of Home: House Home Access: Stairs to enter [employer] Steps: 1 step into back entry Entrance Stairs-Rails: None Home Layout: Multi-level, 1/2 bath on main level, Bed/bath upstairs Alternate Level Stairs-Number of Steps: 12 Alternate Level Stairs-Rails: Right, Left, Can reach both Home Equipment: Tub bench  Lives With: Family   Functional History: Prior Function Level of Independence: Independent Comments: Pt reports he drives and is independent with IADLs   Functional Status:  Mobility: Bed Mobility Overal bed mobility: Needs Assistance Bed Mobility: Supine to Sit Supine to sit: Mod assist General bed mobility comments: Pt requires assist to move LEs to EOB and assist to lift shoulder  from bed  Transfers Overall transfer level: Needs assistance Equipment used: None Transfers: Sit to/from Stand, Stand Pivot Transfers Sit to Stand: Min assist Stand pivot transfers: Min assist General transfer comment: Pt requires min A for balance and safety.  he is impulsive  Ambulation/Gait Ambulation/Gait assistance: Min assist Ambulation Distance (Feet): 20 Feet Assistive device: 1 person hand held assist Gait Pattern/deviations: Step-to pattern, Decreased step length - right, Decreased step length - left, Decreased stance time - left, Decreased stride length, Decreased dorsiflexion - left, Decreased weight shift to left, Shuffle Gait velocity: decreased Gait velocity interpretation: Below normal speed for age/gender General Gait Details: Min assist for balance on Pts left side. Mild buckling noted at times of Lt knee however able to self correct. Tends to drag LLE behind him. Cues for hip flexion and Lt ankle clearance. Able to stand to urinate with min guard assist. Picked up an item on the floor with min  assist for balance. Somewhat impulsive, needs cues to take his time and concentrate on task at hand due to some lack of awareness of deficits.    ADL: ADL Overall ADL's : Needs assistance/impaired Eating/Feeding: Supervision/ safety, Set up, Sitting Grooming: Wash/dry hands, Wash/dry face, Oral care, Brushing hair, Minimal assistance, Standing Upper Body Bathing: Moderate assistance, Sitting Lower Body Bathing: Moderate assistance, Sit to/from stand Upper Body Dressing : Moderate assistance, Sitting Lower Body Dressing: Moderate assistance, Sit to/from stand Toilet Transfer: Minimal assistance, Ambulation, Comfort height toilet, Grab bars Toileting- Clothing Manipulation and Hygiene: Moderate assistance, Sit to/from stand Functional mobility during ADLs: Minimal assistance General ADL Comments: Pt is very impulsive.  He is lethargic with difficulty sustaining arousal, but does  make significant effort to stay awake.  He is slow to initiate activity   Cognition: Cognition Overall Cognitive Status: Difficult to assess Arousal/Alertness: Awake/alert Orientation Level: Oriented X4 Attention: Selective Selective Attention: Appears intact Memory: Appears intact (during formal testing, ? slight difficulty with basic carryo) Awareness: Appears intact Problem Solving: Appears intact Safety/Judgment: Appears intact Cognition Arousal/Alertness: Lethargic Behavior During Therapy: Impulsive, Flat affect Overall Cognitive Status: Difficult to assess Difficult to assess due to: Level of arousal   Blood pressure 95/72, pulse 96, temperature 97.6 F (36.4 C), temperature source Oral, resp. rate 18, height 6' (1.829 m), weight 67.223 kg (148 lb 3.2 oz), SpO2 96 %. Physical Exam  Constitutional: He appears well-developed and well-nourished. No distress.  HENT:  Head: Normocephalic and atraumatic.  Right Ear: External ear normal.  Left Ear: External ear normal.  Eyes: Conjunctivae and EOM are normal. Pupils are equal, round, and reactive to light.  Neck: No tracheal deviation present. No thyromegaly present.  Cardiovascular: Normal rate and regular rhythm.  Exam reveals no gallop and no friction rub.   No murmur heard. Respiratory: No respiratory distress. He has no wheezes. He has no rales.  GI: Soft. Bowel sounds are normal. He exhibits no distension. There is no tenderness.  Neurological:  Motor strength is 5/5 in the deltoid biceps triceps grip hip flexor and knee extensor ankle dorsiflexor Left upper extremity 4/5 in the deltoid, biceps, triceps, grip, hip flexor, knee extensor, ankle dorsiflexor and plantar flexor. Sensation intact to light touch in bilateral upper and lower limbs\ mild left pronator drift He exhibits mild left neglect   Skin: He is not diaphoretic.  Psychiatric: He has a normal mood and affect.    Results for orders placed or performed  during the hospital encounter of 01/24/15 (from the past 48 hour(s))  CBG monitoring, ED     Status: Abnormal   Collection Time: 01/24/15  4:05 PM  Result Value Ref Range   Glucose-Capillary 367 (H) 70 - 99 mg/dL  Glucose, capillary     Status: Abnormal   Collection Time: 01/24/15  7:56 PM  Result Value Ref Range   Glucose-Capillary 281 (H) 70 - 99 mg/dL   Comment 1 Notify RN   Brain natriuretic peptide     Status: Abnormal   Collection Time: 01/24/15  8:05 PM  Result Value Ref Range   B Natriuretic Peptide 166.1 (H) 0.0 - 100.0 pg/mL  Hemoglobin A1c     Status: Abnormal   Collection Time: 01/24/15  8:05 PM  Result Value Ref Range   Hgb A1c MFr Bld 13.3 (H) 4.8 - 5.6 %    Comment: (NOTE)         Pre-diabetes: 5.7 - 6.4         Diabetes: >  6.4         Glycemic control for adults with diabetes: <7.0    Mean Plasma Glucose 335 mg/dL    Comment: (NOTE) Performed At: [facility] [place], [place] [id number] Lindon Romp MD EL:[id number]   Glucose, capillary     Status: Abnormal   Collection Time: 01/24/15 11:50 PM  Result Value Ref Range   Glucose-Capillary 198 (H) 70 - 99 mg/dL  Glucose, capillary     Status: Abnormal   Collection Time: 01/25/15  4:13 AM  Result Value Ref Range   Glucose-Capillary 62 (L) 70 - 99 mg/dL  Glucose, capillary     Status: Abnormal   Collection Time: 01/25/15  4:35 AM  Result Value Ref Range   Glucose-Capillary 178 (H) 70 - 99 mg/dL   Comment 1 Notify RN   Lipid panel     Status: Abnormal   Collection Time: 01/25/15  5:35 AM  Result Value Ref Range   Cholesterol 197 0 - 200 mg/dL   Triglycerides 42 <150 mg/dL   HDL 73 >39 mg/dL   Total CHOL/HDL Ratio 2.7 RATIO   VLDL 8 0 - 40 mg/dL   LDL Cholesterol 116 (H) 0 - 99 mg/dL    Comment:        Total Cholesterol/HDL:CHD Risk Coronary Heart Disease Risk Table                     Men   Women  1/2 Average Risk   3.4   3.3  Average Risk       5.0   4.4  2 X Average Risk    9.6   7.1  3 X Average Risk  23.4   11.0        Use the calculated Patient Ratio above and the CHD Risk Table to determine the patient's CHD Risk.        ATP III CLASSIFICATION (LDL):  <100     mg/dL   Optimal  100-129  mg/dL   Near or Above                    Optimal  130-159  mg/dL   Borderline  160-189  mg/dL   High  >190     mg/dL   Very High   Basic metabolic panel     Status: Abnormal   Collection Time: 01/25/15  5:35 AM  Result Value Ref Range   Sodium 140 135 - 145 mmol/L   Potassium 4.1 3.5 - 5.1 mmol/L   Chloride 104 96 - 112 mmol/L   CO2 25 19 - 32 mmol/L   Glucose, Bld 169 (H) 70 - 99 mg/dL   BUN 16 6 - 23 mg/dL   Creatinine, Ser 0.88 0.50 - 1.35 mg/dL   Calcium 9.5 8.4 - 10.5 mg/dL   GFR calc non Af Amer >90 >90 mL/min   GFR calc Af Amer >90 >90 mL/min    Comment: (NOTE) The eGFR has been calculated using the CKD EPI equation. This calculation has not been validated in all clinical situations. eGFR's persistently <90 mL/min signify possible Chronic Kidney Disease.    Anion gap 11 5 - 15  Glucose, capillary     Status: Abnormal   Collection Time: 01/25/15  7:47 AM  Result Value Ref Range   Glucose-Capillary 145 (H) 70 - 99 mg/dL  Glucose, capillary     Status: Abnormal   Collection Time: 01/25/15 12:22 PM  Result Value Ref Range   Glucose-Capillary 280 (H) 70 - 99 mg/dL  Glucose, capillary     Status: Abnormal   Collection Time: 01/25/15  5:16 PM  Result Value Ref Range   Glucose-Capillary 173 (H) 70 - 99 mg/dL  Glucose, capillary     Status: Abnormal   Collection Time: 01/25/15 10:12 PM  Result Value Ref Range   Glucose-Capillary 171 (H) 70 - 99 mg/dL   Comment 1 Notify RN    Comment 2 Document in Chart   Glucose, capillary     Status: Abnormal   Collection Time: 01/26/15  5:30 AM  Result Value Ref Range   Glucose-Capillary 239 (H) 70 - 99 mg/dL   Comment 1 Notify RN   Basic metabolic panel     Status: Abnormal   Collection Time: 01/26/15  6:28  AM  Result Value Ref Range   Sodium 135 135 - 145 mmol/L   Potassium 3.9 3.5 - 5.1 mmol/L   Chloride 99 96 - 112 mmol/L   CO2 26 19 - 32 mmol/L   Glucose, Bld 249 (H) 70 - 99 mg/dL   BUN 15 6 - 23 mg/dL   Creatinine, Ser 1.08 0.50 - 1.35 mg/dL   Calcium 9.0 8.4 - 10.5 mg/dL   GFR calc non Af Amer 77 (L) >90 mL/min   GFR calc Af Amer 89 (L) >90 mL/min    Comment: (NOTE) The eGFR has been calculated using the CKD EPI equation. This calculation has not been validated in all clinical situations. eGFR's persistently <90 mL/min signify possible Chronic Kidney Disease.    Anion gap 10 5 - 15  Protime-INR     Status: None   Collection Time: 01/26/15  6:28 AM  Result Value Ref Range   Prothrombin Time 14.3 11.6 - 15.2 seconds   INR 1.10 0.00 - 1.49  Glucose, capillary     Status: Abnormal   Collection Time: 01/26/15 11:30 AM  Result Value Ref Range   Glucose-Capillary 307 (H) 70 - 99 mg/dL   Ct Angio Head W/cm &/or Wo Cm  01/25/2015   CLINICAL DATA:  54 year old male stroke patient with left side weakness and difficulty swallowing. Not a candidate for thrombolysis. Cannot have MRI. Initial encounter.  EXAM: CT ANGIOGRAPHY HEAD AND NECK  TECHNIQUE: Multidetector CT imaging of the head and neck was performed using the standard protocol during bolus administration of intravenous contrast. Multiplanar CT image reconstructions and MIPs were obtained to evaluate the vascular anatomy. Carotid stenosis measurements (when applicable) are obtained utilizing NASCET criteria, using the distal internal carotid diameter as the denominator.  CONTRAST:  8m OMNIPAQUE IOHEXOL 350 MG/ML SOLN  COMPARISON:  Head CT without contrast 01/24/2015.  FINDINGS: CT HEAD  Brain: Evolved right MCA anterior division infarct with progressive gray and white matter hypodensity corresponding to the insula and anterior operculum, in the area questioned by CT yesterday. No acute intracranial hemorrhage identified. No associated  mass effect. No ventriculomegaly. Stable gray-white matter differentiation elsewhere.  Calvarium and skull base: Negative.  Paranasal sinuses: Visualized paranasal sinuses and mastoids are clear.  Orbits: Negative.  CTA NECK  Other neck: Left chest cardiac pacemaker with mild streak artifact. Paraseptal and centrilobular emphysema. No superior mediastinal lymphadenopathy.  Thyroid, larynx, pharynx, parapharyngeal spaces, retropharyngeal space and sublingual space are within normal limits.  Diffuse increased attenuation of subcutaneous fat and superficial soft tissues such that there is portal in a shin of the submandibular and parotid glands, as well as the muscle  layers. No cervical lymphadenopathy identified.  Skeleton: Degenerative changes in the cervical spine. No acute osseous abnormality identified.  Aortic arch: 3 vessel arch configuration. Mild distal arch calcified plaque. No great vessel origin stenosis.  Right carotid system: Negative right CCA. Widely patent right carotid bifurcation with minimal calcified plaque (series 504, image 99). Negative cervical right ICA.  Left carotid system: Negative left CCA. Negative left carotid bifurcation. Negative cervical left ICA.  Vertebral arteries:  No proximal right subclavian artery stenosis. Paravertebral venous contrast reflux obscures the proximal right vertebral artery and its origin. The right V2 segment is normal. Negative right vertebral artery at the skullbase.  No proximal left subclavian artery stenosis. Non dominant appearing left vertebral artery has a normal origin, and is normal to the skullbase.  CTA HEAD  Posterior circulation: Mildly dominant distal right vertebral artery. Normal PICA origins. Normal vertebrobasilar junction. No basilar artery stenosis. Normal SCA and left PCA origins. Fetal type right PCA origin. Left posterior communicating artery is diminutive or absent. Bilateral PCA branches are within normal limits.  Anterior circulation:  Negative left ICA siphon. Normal left ophthalmic artery origin. Negative right ICA siphon. Normal right ophthalmic and posterior communicating artery origins. Normal carotid termini. Normal MCA and ACA origins. Diminutive or absent anterior communicating artery. Bilateral ACA branches are within normal limits. Left MCA branches are within normal limits.  Right MCA origin is normal and M1 segment is patent. Right MCA bifurcation is patent. There is then filling defect and irregularity of proximal right MCA M2 branches, with subtotal loss of enhancement of the proximal anterior sylvian division best seen on series 506, image 16. Despite this, there is preserved more distal right M2 and M3 enhancement. No major right MCA branch occlusion is identified.  Venous sinuses: Negative.  Anatomic variants: Mildly dominant right vertebral artery. Fetal type right PCA origin.  Delayed phase: No abnormal enhancement identified.  IMPRESSION: 1. Evolving anterior division right MCA infarct. No hemorrhage or mass effect. 2. Associated filling defect just beyond the right MCA bifurcation affecting the proximal right M2 branches compatible with thromboembolic disease. Despite this, no major right MCA branch occlusion is identified. 3. Otherwise negative intracranial CTA. 4. Minimal atherosclerosis in the neck and aortic arch. 5. Emphysema.   Electronically Signed   By: Genevie Ann M.D.   On: 01/25/2015 14:31    Dg Chest Port 1 View  01/26/2015   CLINICAL DATA:  Shortness of breath, history chronic systolic heart failure, ischemic cardiomyopathy, type 2 diabetes, hypercholesterolemia, smoker  EXAM: PORTABLE CHEST - 1 VIEW  COMPARISON:  Portable exam 0826 hours compared to 12/03/2014  FINDINGS: LEFT subclavian sequential pacemaker leads project over RIGHT atrium and RIGHT ventricle.  Upper normal heart size.  Atherosclerotic calcification aorta.  Mediastinal contours and pulmonary vascularity normal.  Emphysematous changes without  infiltrate, pleural effusion or pneumothorax.  No acute osseous findings.  IMPRESSION: No acute abnormalities.   Electronically Signed   By: Lavonia Dana M.D.   On: 01/26/2015 08:32       Medical Problem List and Plan: 1. Functional deficits secondary to embolic right MCA infarct 2.  DVT Prophylaxis/Anticoagulation: Pharmaceutical: Coumadin 3. Pain Management: Tylenol prn 4. Mood: Appears to have anxiety with occasional hypoventilation. LCSW to follow for evaluation and support.  5. Neuropsych: This patient is intermittently capable of making decisions on his own behalf.  Encourage compliance with all medications.  6. Skin/Wound Care:  Routine pressure relief measures.  7. Fluids/Electrolytes/Nutrition:  Monitor I/O. Check lytes in  am. Encourage nectar liquids.  8. Chronic Systolic CHF: Low salt diet. Monitor weights daily.  Lasix every other day.  9. DM type 2:  Monitor BS with achs checks.      Post Admission Physician Evaluation: 1. Functional deficits secondary  to embolic right MCA infarct. 2. Patient is admitted to receive collaborative, interdisciplinary care between the physiatrist, rehab nursing staff, and therapy team. 3. Patient's level of medical complexity and substantial therapy needs in context of that medical necessity cannot be provided at a lesser intensity of care such as a SNF. 4. Patient has experienced substantial functional loss from his/her baseline which was documented above under the "Functional History" and "Functional Status" headings.  Judging by the patient's diagnosis, physical exam, and functional history, the patient has potential for functional progress which will result in measurable gains while on inpatient rehab.  These gains will be of substantial and practical use upon discharge  in facilitating mobility and self-care at the household level. 5. Physiatrist will provide 24 hour management of medical needs as well as oversight of the therapy plan/treatment  and provide guidance as appropriate regarding the interaction of the two. 6. 24 hour rehab nursing will assist with bladder management, bowel management, safety, skin/wound care, disease management, medication administration, pain management and patient education  and help integrate therapy concepts, techniques,education, etc. 7. PT will assess and treat for/with: Lower extremity strength, range of motion, stamina, balance, functional mobility, safety, adaptive techniques and equipment, NMR, visual perceptual awareness, ego support.   Goals are: mod I to supervision. 8. OT will assess and treat for/with: ADL's, functional mobility, safety, upper extremity strength, adaptive techniques and equipment, NMR, visual perceptual awareness, community reintegration, ego support.   Goals are: mod I to supervision. Therapy may proceed with showering this patient. 9. SLP will assess and treat for/with: cognition, communication.  Goals are: mod I to supervision. 10. Case Management and Social Worker will assess and treat for psychological issues and discharge planning. 11. Team conference will be held weekly to assess progress toward goals and to determine barriers to discharge. 12. Patient will receive at least 3 hours of therapy per day at least 5 days per week. 13. ELOS: 11-18 days       14. Prognosis:  excellent     Meredith Staggers, MD, [facility] 01/26/2015   01/26/2015

## 2015-01-26 NOTE — Progress Notes (Signed)
STROKE TEAM PROGRESS NOTE   HISTORY Dennis [name] is an 54 y.o. male with a past medical history significant for hypercholesterolemia, DM, CAD, chronic systolic congestive heart failure with last ECHO 12/15 EF 20%, left ventricular non-compaction on coumadin, status post pacemaker/AICD placement, substance abuse, OSA, brought in by family member for further evaluation of left hemiparesis, left face weakness, dysarthria. Patient was reportedly able to walk last night (LKW 01/23/2105, time unknown), but he said that he woke up this morning 01/23/2105 feeling no well and around 8 am fell to the floor and had to crawl to a chair. According to his family member, by 8:30 she noticed that he was slurring his words, had left face droop, and was dragging his left leg and complaining that he was not able to walk. He is on chronic coumadin but hasn't been taking it for 2 weeks because he can not afford it. CT brain showed possible loss of of gray-white differentiation in the insula and posterior right frontal region concerning for acute ischemia. Denies HA, vertigo, double vision, numbness-tingling, language or vision impairment. Having trouble handling his own secretions. Serologies significant for Na 131, glucose 536. UDS positive for cocaine. ETOH<5. INR 1.02. Patient was not administered TPA secondary to delay in arrival. He was admitted for further evaluation and treatment.   SUBJECTIVE (INTERVAL HISTORY) The patient's wife is at the bedside. The patient reports that he was not taking his Coumadin at home. It has been restarted. The patient noted that he has been sleeping more than normal. Dr. Erlinda Hong explained that this was due to his stroke and working out with PT this am.   OBJECTIVE Temp:  [97.6 F (36.4 C)-99 F (37.2 C)] 97.6 F (36.4 C) (04/15 1416) Pulse Rate:  [90-108] 96 (04/15 1416) Cardiac Rhythm:  [-] Sinus tachycardia (04/14 2000) Resp:  [18-20] 18 (04/15 1416) BP: (82-99)/(61-78) 95/72 mmHg (04/15  1416) SpO2:  [96 %-100 %] 96 % (04/15 1416) Weight:  [67.223 kg (148 lb 3.2 oz)] 67.223 kg (148 lb 3.2 oz) (04/15 0118)   Recent Labs Lab 01/25/15 1222 01/25/15 1716 01/25/15 2212 01/26/15 0530 01/26/15 1130  GLUCAP 280* 173* 171* 239* 307*    Recent Labs Lab 01/24/15 1325 01/24/15 1337 01/25/15 0535 01/26/15 0628  NA 131* 132* 140 135  K 4.8 4.9 4.1 3.9  CL 96 95* 104 99  CO2 27  --  25 26  GLUCOSE 536* 564* 169* 249*  BUN 16 19 16 15   CREATININE 1.07 1.00 0.88 1.08  CALCIUM 9.2  --  9.5 9.0    Recent Labs Lab 01/24/15 1325  AST 18  ALT 18  ALKPHOS 99  BILITOT 1.0  PROT 6.3  ALBUMIN 3.3*    Recent Labs Lab 01/24/15 1337 01/24/15 1359  WBC  --  9.4  NEUTROABS  --  6.8  HGB 17.7* 15.5  HCT 52.0 46.1  MCV  --  92.2  PLT  --  199   No results for input(s): CKTOTAL, CKMB, CKMBINDEX, TROPONINI in the last 168 hours.  Recent Labs  01/24/15 1325 01/26/15 0628  LABPROT 13.5 14.3  INR 1.02 1.10    Recent Labs  01/24/15 1254  COLORURINE YELLOW  LABSPEC 1.041*  PHURINE 5.0  GLUCOSEU >1000*  HGBUR NEGATIVE  BILIRUBINUR NEGATIVE  KETONESUR 15*  PROTEINUR NEGATIVE  UROBILINOGEN 0.2  NITRITE NEGATIVE  LEUKOCYTESUR NEGATIVE       Component Value Date/Time   CHOL 197 01/25/2015 0535   TRIG 42 01/25/2015 0535  HDL 73 01/25/2015 0535   CHOLHDL 2.7 01/25/2015 0535   VLDL 8 01/25/2015 0535   LDLCALC 116* 01/25/2015 0535   Lab Results  Component Value Date   HGBA1C 13.3* 01/24/2015      Component Value Date/Time   LABOPIA NONE DETECTED 01/24/2015 1254   COCAINSCRNUR POSITIVE* 01/24/2015 1254   LABBENZ NONE DETECTED 01/24/2015 1254   AMPHETMU NONE DETECTED 01/24/2015 1254   THCU NONE DETECTED 01/24/2015 1254   LABBARB NONE DETECTED 01/24/2015 1254     Recent Labs Lab 01/24/15 Kief <5   I have personally reviewed the radiological images below and agree with the radiology interpretations.  Ct Head Wo Contrast  01/24/2015    IMPRESSION: Question loss of gray-white differentiation in the insula and posterior frontal region on the right that could go along with the earliest CT manifestation of acute infarction in the right middle cerebral artery territory. No hemorrhage.      Ct Angio head and Neck W/cm &/or Wo/cm  01/25/2015   IMPRESSION: 1. Evolving anterior division right MCA infarct. No hemorrhage or mass effect. 2. Associated filling defect just beyond the right MCA bifurcation affecting the proximal right M2 branches compatible with thromboembolic disease. Despite this, no major right MCA branch occlusion is identified. 3. Otherwise negative intracranial CTA. 4. Minimal atherosclerosis in the neck and aortic arch. 5. Emphysema.     2D echo - - Left ventricle: The cavity size was moderately to severely dilated. Wall thickness was normal. Systolic function was severely reduced. The estimated ejection fraction was in the range of 10% to 15%. Diffuse hypokinesis. There was fusion of early and atrial contributions to ventricular filling. The study is not technically sufficient to allow evaluation of LV diastolic function. No evidence of thrombus. - Aortic valve: There was trivial regurgitation. - Mitral valve: There was malcoaptation of the valve leaflets. There was mild to moderate regurgitation directed centrally. - Left atrium: The atrium was mildly dilated. - Right ventricle: Systolic function was mildly reduced. - Atrial septum: The septum bowed from left to right, consistent with increased left atrial pressure. - Pericardium, extracardiac: A small pericardial effusion was identified posterior to the heart. The fluid had no internal echoes.There was no evidence of hemodynamic compromise.   PHYSICAL EXAM  Temp:  [97.6 F (36.4 C)-99 F (37.2 C)] 97.6 F (36.4 C) (04/15 1416) Pulse Rate:  [90-108] 96 (04/15 1416) Resp:  [18-20] 18 (04/15 1416) BP: (82-99)/(61-78) 95/72 mmHg (04/15  1416) SpO2:  [96 %-100 %] 96 % (04/15 1416) Weight:  [67.223 kg (148 lb 3.2 oz)] 67.223 kg (148 lb 3.2 oz) (04/15 0118)  General - Well nourished, well developed, in no apparent distress, eating lunch.  Ophthalmologic - fundi not visualized due to incorporation.  Cardiovascular - Regular rate and rhythm.  Mental Status -  Level of arousal and orientation to time, place, and person were intact. Language including expression, naming, repetition, comprehension was assessed and found intact.  Cranial Nerves II - XII - II - Visual field intact OU. III, IV, VI - Extraocular movements intact. V - Facial sensation intact bilaterally. VII - left facial droop. VIII - Hearing & vestibular intact bilaterally. X - Palate elevates symmetrically. XI - Chin turning & shoulder shrug intact bilaterally. XII - Tongue protrusion intact.  Motor Strength - The patient's strength was LUE 3/5 proximal and 4/5 distal, LLE 5/5 proximal and 4/5 distal, and pronator drift was present.  RUE and RLE 5/5. Bulk was normal and fasciculations  were absent.   Motor Tone - Muscle tone was assessed at the neck and appendages and was normal.  Reflexes - The patient's reflexes were 1+ in all extremities and he had no pathological reflexes.  Sensory - Light touch, temperature/pinprick were assessed and were symmetrical.    Coordination - The patient had normal movements in the right hand with no ataxia or dysmetria.  Tremor was absent.  Gait and Station - deferred due to safety concerns.   ASSESSMENT/PLAN Mr. Seth Higginbotham is a 54 y.o. male with history of  hypercholesterolemia, DM, CAD, chronic systolic congestive heart failure with last ECHO 12/15 EF 20%, left ventricular non-compaction on coumadin, status post pacemaker/AICD placement, substance abuse, OSA,  presenting with left hemiparesis, left face weakness, dysarthria. He did not receive IV t-PA due to delay in arrival.   Stroke:  Non-dominant right MCA infarct,  felt to be embolic due to low EF, imaging pending  Resultant  Left hemiparesis  MRI  / MRA  Not able to perform due to ICD  Repeat CT showed moderate size left MCA stroke  CTA head & neck unremarkable   2D Echo  EF 10-15%, worsened than in 09/2014   LDL 116, not at goal  HgbA1c pending, 10.5 in Dec 2015  SCDs for VTE prophylaxis DIET DYS 3 Room service appropriate?: Yes; Fluid consistency:: Nectar Thick  warfarin prior to admission, now ordered  aspirin 81 mg orally every day , did receive aspirin suppository yesterday. Not taking routinely prior to admission. INR 1.02 on arrival. CT repeat showed infarct size not big, safe to start coumadin today and INR goal will reach in 3-5 days. Once INR 2-3, aspirin can be stopped.  Patient counseled to be compliant with his antithrombotic medications  Ongoing aggressive stroke risk factor management  Therapy recommendations:  CIR today  Disposition: CIR today  Cardiomyopathy, LVNC on coumadin  Chronic anticoagulation  Pt not compliant with medication  INR 1.02 on admission  EF only 10-15% this time, further worsening than in 09/2014  CT repeat showed infarct size not big, safe to start coumadin today and INR goal will reach in 3-5 days.  Hypertension/Hypotension  Home meds:  Corge, lasix  Home meds on hold due to hypotension since admission  BP 83-117/58-78 past 24h (01/26/2015 @ 3:51 PM)   Hyperlipidemia  Home meds:  lipitior 40 resumed in hospital  LDL 116, goal < 70  Patient encouraged to be compliant  Continue statin at discharge  Diabetes  HgbA1c 10.5 in Dec 2015, goal < 7.0  Uncontrolled. Glu 564 yesterday  Hypoglycemic event w/ diaphoresis, given 25 ml D5  Cocaine use  Positive UDS   cocaine cessation counseling provided  Pt is willing to quit  Other Stroke Risk Factors  Cigarette smoker, advised to stop smoking  Occasional ETOH use  Other Active Problems  chronic systolic heart failure  with ejection fraction of 20%/left ventricular non-compaction on chronic anticoagulation, noncompliant  Hospital day # 2  [place], [place] for Pager information 01/26/2015 3:51 PM   I, the attending vascular neurologist, have personally obtained a history, examined the patient, evaluated laboratory data, individually viewed imaging studies and agree with radiology interpretations. I also obtained additional history from pt's wife at bedside. I also discussed with Dr. Candiss Norse regarding his care plan. Together with the NP/PA, we formulated the assessment and plan of care which reflects our mutual decision.  I have made any additions or clarifications directly  to the above note and agree with the findings and plan as currently documented.   54 year old male with history of diabetes, HLD, cardiomyopathy with EF 20%, left ventricle non-compaction on Coumadin was admitted for left MCA stroke. Repeat CT showed stroke size not big, recommend to start Coumadin today with INR reached goal in 3-5 days. LDL and A1c was high, currently on statin and aggressive management by primary team. Patient not compliant with medication at home. 2-D echo showed only 10-15% EF, further worsening. Pt has multiple medical risk factors for stroke, but not compliant with medication and polysubstance abuse, high risk for recurrent stroke. Aggressive risk factor modification, and medication compliance and PT/OT.  Neurology will sign off. Please call with questions. Pt will follow up with Dr. Erlinda Hong at [facility] in about 2 months. Thanks for the consult.  Rosalin Hawking, MD PhD Stroke Neurology 01/26/2015 11:01 PM        To contact Stroke Continuity provider, please refer to [email or web address] After hours, contact General Neurology

## 2015-01-26 NOTE — Progress Notes (Signed)
PMR Admission Coordinator Pre-Admission Assessment  Patient: Dennis [name] is an 54 y.o., male MRN: [id number] DOB: 1961/08/08 Height: 6' (182.9 cm) Weight: 67.223 kg (148 lb 3.2 oz)  Insurance Information HMO: yes PPO: PCP: IPA: 80/20: OTHER:medicare replacement PRIMARY: Bubba Hales Policy#: [id number] Subscriber: pt CM Name: Lorel Monaco Phone#: [phone] ext [phone] Fax#: [phone] Pre-Cert#: [id number] Employer: disabled approved for 7 days to be followed by onsite CM, Gaylan Gerold at [phone] Benefits: Phone #: [phone] Name: 4/14 Eff. Date: 10/13/14 Deduct: none Out of Pocket Max: $4900 Life Max: none CIR: $345 per day days 1-5 then covers 100% SNF: no copay days 1-20; $160 copay per day days 21-51; no copay days 52-100 Outpatient: $40 copay per visit Co-Pay: no visit limit Home Health: 100% Co-Pay: no visit limit DME: 80% Co-Pay: 20% Providers: in network  SECONDARY: none  Medicaid Application Date: Case Manager:  Disability Application Date: Case Worker:   Emergency Contact Information Contact Information    Name Relation Home Work Mobile   Nored,Alsty[place] Mother [phone]     Keith,Pat Significant other   [phone]     Current Medical History  Patient Admitting Diagnosis: 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 male with history of chronic systolic HF, OSA, DM type 2, Left ventricular non-compaction on chronic coumadin, s/p ICD, medication non-compliance; who was found down by family with slurred speech, left facial droop and left sided weakness. Blood sugar- 536 and UDS  positive for cocaine. CT head with early signs of R-MCA infarct. Dr. Armida Sans consulted and recommended full workup for likely cardio-embolic stroke. Swallow evaluation done revealing mild oropharyngeal dysphagia and patient placed on DIII, nectar liquids. Cognitive evaluation done revealing impulsivity as well as subtle short term memory deficits.  Repeat CT showed infarct size not big, per Neuro so recommend safe to start coumadin on 4/15 with INR goal to be reached in 3-5 days. Once INR 2-3, aspirin can be stopped. Pharmacy to dose.   Total: 4 [facility]    Past Medical History  Past Medical History  Diagnosis Date  . Chronic systolic heart failure     a) Mixed ICM/NICM b) RHC (05/2014): RA 2, RV 19/2/3, PA 22/14 (18), PCWP 6, Fick CO/CI: 5.2 / 2.7, PVR 2.3 WU, PA 60% and 64% c) ECHO (05/2014): EF 20-25%, diff HK, akinesis entireanteroseptal myocardium, triv AI, mod MR, LA mod/sev dilated  . Automatic implantable cardioverter-defibrillator in situ   . Heart murmur   . Sleep apnea     "cleared after T&A"  . Left ventricular noncompaction   . Ischemic cardiomyopathy     a) Coronary angiography (12/2008) at [facility]: Lmain: nl, LAD mid 100% stenosis with left to left and right-to-left collaterals to the distal LAD; Lcx: nl, RCA nl.   . Type II diabetes mellitus   . High cholesterol   . Pneumonia 1988  . Drug abuse and dependence   . CHF (congestive heart failure)     Family History  family history includes Diabetes in his brother, brother, father, and mother; Heart disease in his father and mother; Prostate cancer in his father.  Prior Rehab/Hospitalizations: none  Current Medications   Current facility-administered medications:  . albuterol (PROVENTIL) (2.5 MG/3ML) 0.083% nebulizer solution 2.5 mg, 2.5 mg, Nebulization, Q4H PRN, Thurnell Lose, MD, 2.5 mg at 01/25/15 1851 . aspirin EC tablet 81 mg, 81 mg, Oral, Daily, [employer],  MD,  81 mg at 01/26/15 0945 . atorvastatin (LIPITOR) tablet 20 mg, 20 mg, Oral, q1800, Thurnell Lose, MD, 20 mg at 01/25/15 1257 . heparin injection 5,000 Units, 5,000 Units, Subcutaneous, 3 times per day, Thurnell Lose, MD, 5,000 Units at 01/26/15 1418 . insulin aspart (novoLOG) injection 0-20 Units, 0-20 Units, Subcutaneous, TID WC, Thurnell Lose, MD, 15 Units at 01/26/15 1148 . insulin detemir (LEVEMIR) injection 10 Units, 10 Units, Subcutaneous, QHS, Annita Brod, MD, 10 Units at 01/25/15 2153 . RESOURCE THICKENUP CLEAR, , Oral, PRN, Thurnell Lose, MD . senna-docusate (Senokot-S) tablet 1 tablet, 1 tablet, Oral, QHS PRN, Annita Brod, MD . warfarin (COUMADIN) tablet 10 mg, 10 mg, Oral, ONCE-1800, Rachel L Rumbarger, RPH . Warfarin - Pharmacist Dosing Inpatient, , Does not apply, q1800, Skeet Simmer, RPH, 0 at 01/25/15 2000  Patients Current Diet: DIET DYS 3 Room service appropriate?: Yes; Fluid consistency:: Nectar Thick Repeat MBS pending  Precautions / Restrictions Precautions Precautions: Fall Restrictions Weight Bearing Restrictions: No   Prior Activity Level Community (5-7x/wk): goes to Narcotics Anonymous meetings 3 xs per day  Drives and independent. Disabled since 2009  [place] / [place] Devices/Equipment: None Home Equipment: Tub bench  Prior Functional Level Prior Function Level of Independence: Independent Comments: Pt reports he drives and is independent with IADLs   Current Functional Level Cognition  Arousal/Alertness: Awake/alert Overall Cognitive Status: Difficult to assess Difficult to assess due to: Level of arousal Orientation Level: Oriented X4 Attention: Selective Selective Attention: Appears intact Memory: Appears intact (during formal testing, ? slight difficulty with basic carryo) Awareness: Appears intact Problem Solving: Appears intact Safety/Judgment: Appears intact   Extremity  Assessment (includes Sensation/Coordination)  Upper Extremity Assessment: LUE deficits/detail LUE Deficits / Details: Pt able to reach to ~80* shoulder flexion. Elbow flex/ext grossly 3/5; mass grasp and release  LUE Sensation: decreased light touch LUE Coordination: decreased fine motor, decreased gross motor  Lower Extremity Assessment: Defer to PT evaluation LLE Deficits / Details: >4/5 MMT with knee extension, hip flexion, ankle DF/PF, and hallux flex/ext. LLE Sensation: decreased proprioception (ankle assessed)    ADLs  Overall ADL's : Needs assistance/impaired Eating/Feeding: Supervision/ safety, Set up, Sitting Grooming: Wash/dry hands, Wash/dry face, Oral care, Brushing hair, Minimal assistance, Standing Upper Body Bathing: Moderate assistance, Sitting Lower Body Bathing: Moderate assistance, Sit to/from stand Upper Body Dressing : Moderate assistance, Sitting Lower Body Dressing: Moderate assistance, Sit to/from stand Toilet Transfer: Minimal assistance, Ambulation, Comfort height toilet, Grab bars Toileting- Clothing Manipulation and Hygiene: Moderate assistance, Sit to/from stand Functional mobility during ADLs: Minimal assistance General ADL Comments: Pt is very impulsive. He is lethargic with difficulty sustaining arousal, but does make significant effort to stay awake. He is slow to initiate activity     Mobility  Overal bed mobility: Needs Assistance Bed Mobility: Supine to Sit Supine to sit: Mod assist General bed mobility comments: Pt requires assist to move LEs to EOB and assist to lift shoulder from bed     Transfers  Overall transfer level: Needs assistance Equipment used: None Transfers: Sit to/from Stand, Stand Pivot Transfers Sit to Stand: Min assist Stand pivot transfers: Min assist General transfer comment: Pt requires min A for balance and safety. he is impulsive     Ambulation / Gait / Stairs / Emergency planning/management officer   Ambulation/Gait Ambulation/Gait assistance: Museum/gallery curator (Feet): 20 Feet Assistive device: 1 person hand held assist Gait Pattern/deviations: Step-to pattern, Decreased step length -  right, Decreased step length - left, Decreased stance time - left, Decreased stride length, Decreased dorsiflexion - left, Decreased weight shift to left, Shuffle Gait velocity: decreased Gait velocity interpretation: Below normal speed for age/gender General Gait Details: Min assist for balance on Pts left side. Mild buckling noted at times of Lt knee however able to self correct. Tends to drag LLE behind him. Cues for hip flexion and Lt ankle clearance. Able to stand to urinate with min guard assist. Picked up an item on the floor with min assist for balance. Somewhat impulsive, needs cues to take his time and concentrate on task at hand due to some lack of awareness of deficits.    Posture / Balance Balance Overall balance assessment: Needs assistance Sitting-balance support: Feet supported Sitting balance-Leahy Scale: Fair Standing balance support: During functional activity Standing balance-Leahy Scale: Poor Standing balance comment: requires min A     Special needs/care consideration Bowel mgmt: continent Bladder mgmt: continent Diabetic mgmt CBG 536 on admission Hgb A1 C 10.5 09/2014   Previous Home Environment Living Arrangements: Parent Lives With: Family Available Help at Discharge: Family, Available 24 hours/day Type of Home: House Home Layout: Multi-level, 1/2 bath on main level, Bed/bath upstairs Alternate Level Stairs-Rails: Right, Left, Can reach both Alternate Level Stairs-Number of Steps: 12 Home Access: Stairs to enter Entrance Stairs-Rails: None Entrance Stairs-Number of Steps: 1 step into back entry Bathroom Shower/Tub: Tub/shower unit, Architectural technologist: Standard Bathroom Accessibility: Yes How Accessible: Accessible via walker Louise:  No  Discharge Living Setting Plans for Discharge Living Setting: Lives with (comment), Other (Comment) (Mom) Type of Home at Discharge: House Discharge Home Layout: Multi-level, 1/2 bath on main level, Bed/bath upstairs Alternate Level Stairs-Rails: Right, Left, Can reach both Alternate Level Stairs-Number of Steps: 12 Discharge Home Access: Stairs to enter Entrance Stairs-Rails: None Entrance Stairs-Number of Steps: 1 step in back Discharge Bathroom Shower/Tub: Tub/shower unit, Curtain Discharge Bathroom Toilet: Standard Discharge Bathroom Accessibility: Yes How Accessible: Accessible via walker Does the patient have any problems obtaining your medications?: Yes (Describe) (Numerous calls to PCP for assist with copays. Noncompliance )  Social/Family/Support Systems Patient Roles: Partner, Parent (Has 47 year old daughter and has girlfriend, Electrical engineer) Sport and exercise psychologist Information: Viann Fish, Mom and GF, Electrical engineer Anticipated Caregiver: Mom and girlfriend , Pat Anticipated Ambulance person Information: see above Ability/Limitations of Caregiver: Mom can provide supervision only Caregiver Availability: 24/7 Discharge Plan Discussed with Primary Caregiver: Yes Is Caregiver In Agreement with Plan?: Yes Does Caregiver/Family have Issues with Lodging/Transportation while Pt is in Rehab?: No  Goals/Additional Needs Patient/Family Goal for Rehab: Supervision with PT, OT, and SLP Expected length of stay: ELOS 10- 14 days Special Service Needs: UDS positive cocaine on admission. Pt states he goes to narcotics anonymous 3 times per day. Mom and GF are aware that he goes to NA. I did not discuss UDS positive for cocaine on admission. I did tell him he is drug screened each admission. He did not ask for results Pt/Family Agrees to Admission and willing to participate: Yes Program Orientation Provided & Reviewed with Pt/Caregiver Including Roles & Responsibilities: Yes  Decrease burden of Care through IP rehab  admission: n/a  Possible need for SNF placement upon discharge:not anticipated  Patient Condition: This patient's condition remains as documented in the consult dated 01/25/15 , in which the Rehabilitation Physician determined and documented that the patient's condition is appropriate for intensive rehabilitative care in an inpatient rehabilitation facility. Will admit to inpatient rehab today.  Preadmission  Screen Completed By: Cleatrice Burke, 01/26/2015 2:20 PM ______________________________________________________________________  Discussed status with Dr. Naaman Plummer on 01/26/15 at 1418 and received telephone approval for admission today.  Admission Coordinator: Cleatrice Burke, time 1610 Date 01/26/15.          Cosigned by: Meredith Staggers, MD at 01/26/2015 2:37 PM  Revision History     Date/Time User Provider Type Action   01/26/2015 2:37 PM Meredith Staggers, MD Physician Cosign   01/26/2015 2:20 PM Cristina Gong, RN Rehab Admission Coordinator Sign

## 2015-01-26 NOTE — Progress Notes (Signed)
[place] for Coumadin Indication: stroke and non-compaction cardiomyopathy  No Known Allergies  Labs:  Recent Labs  01/24/15 1325 01/24/15 1337 01/24/15 1359 01/25/15 0535 01/26/15 0628  HGB  --  17.7* 15.5  --   --   HCT  --  52.0 46.1  --   --   PLT  --   --  199  --   --   APTT 29  --   --   --   --   LABPROT 13.5  --   --   --  14.3  INR 1.02  --   --   --  1.10  CREATININE 1.07 1.00  --  0.88 1.08    Estimated Creatinine Clearance: 76 mL/min (by C-G formula based on Cr of 1.08).    Assessment: 54 yr old male on long-term Coumadin for non-compaction cardiomyopathy admitted 01/24/15 with new stroke.  Had not been taking his Coumadin for about 2 weeks prior to admission.  INR 1.02, at baseline.  OK to resume Coumadin per Neurology, as infarct size is not big.  Patient reports most recent Coumadin regimen was 10 mg daily.  Currently on Heparin 5000 units sq q8hrs and EC ASA 81 mg daily.    Now transferred to rehab  Goal of Therapy:  INR 2-3 Monitor platelets by anticoagulation protocol: Yes   Plan:   Coumadin 10 mg x 1 tonight.  Daily PT/INR.  Stop Aspirin and SQ heparin when INR > 2.  Thank you Anette Guarneri, PharmD [phone]  01/26/2015,6:25 PM

## 2015-01-26 NOTE — Progress Notes (Signed)
CARE MANAGEMENT NOTE 01/26/2015  Patient:  [facility]   Account Number:  [id number]  Date Initiated:  01/26/2015  Documentation initiated by:  Lorne Skeens  Subjective/Objective Assessment:   Patient was admitted with CVA. Lives at home with parent.     Action/Plan:   Will follow for discharge needs pending PT/OT evals and physician orders.   Anticipated DC Date:  01/29/2015   Anticipated DC Plan:  IP REHAB FACILITY         Choice offered to / List presented to:             Status of service:  In process, will continue to follow Medicare Important Message given?  YES (If response is "NO", the following Medicare IM given date fields will be blank) Date Medicare IM given:  01/26/2015 Medicare IM given by:  Lorne Skeens Date Additional Medicare IM given:   Additional Medicare IM given by:    Discharge Disposition:    Per UR Regulation:  Reviewed for med. necessity/level of care/duration of stay  If discussed at [place] of Stay Meetings, dates discussed:    Comments:  01/26/15 [place], MSN, CM- Medicare IM letter provided.

## 2015-01-26 NOTE — Progress Notes (Signed)
Physical Therapy Treatment Patient Details Name: Dennis [name] MRN: [id number] DOB: Jan 08, 1961 Today's Date: 01/26/2015    History of Present Illness 54 y.o. male admitted with Dense L Hemiparesis - likely right MCA stroke.    PT Comments    Patient is making good progress with PT.  Poor LUE coordination as demonstrated w/ opening doors.  1 person hand held assist for ambulation w/ lean and staggering to the L.  Pt will benefit from additional therapy services to improve functional mobility and independence.     Follow Up Recommendations  CIR     Equipment Recommendations   (TBD at next venue of care)    Recommendations for Other Services       Precautions / Restrictions Precautions Precautions: Fall Restrictions Weight Bearing Restrictions: No    Mobility  Bed Mobility               General bed mobility comments: Pt in chair  Transfers Overall transfer level: Needs assistance Equipment used: None Transfers: Sit to/from Stand Sit to Stand: Min assist         General transfer comment: Pt requires min A for balance and safety w/ slight postural sway ant/post upon standing  Ambulation/Gait Ambulation/Gait assistance: Min assist Ambulation Distance (Feet): 50 Feet Assistive device: 1 person hand held assist Gait Pattern/deviations: Step-through pattern;Decreased stride length;Antalgic;Staggering left;Decreased weight shift to right;Drifts right/left Gait velocity: decreased Gait velocity interpretation: Below normal speed for age/gender General Gait Details: Pt w/ mild drag of LLE behind him, improved following verbal cues for L hip flexion for foot clearance.  Pt staggering to his L when turning and instructed to take turns slowly.  Dec L knee and hip flexion w/ L arm hanging by his side.   Stairs            Wheelchair Mobility    Modified Rankin (Stroke Patients Only) Modified Rankin (Stroke Patients Only) Pre-Morbid Rankin Score: No  symptoms Modified Rankin: Moderately severe disability     Balance Overall balance assessment: Needs assistance Sitting-balance support: Feet supported;No upper extremity supported Sitting balance-Leahy Scale: Fair     Standing balance support: During functional activity;Single extremity supported Standing balance-Leahy Scale: Poor                      Cognition Arousal/Alertness: Lethargic Behavior During Therapy: Impulsive;Flat affect Overall Cognitive Status:  ((unclear baseline))                      Exercises General Exercises - Upper Extremity Shoulder Flexion: AROM;Both;10 reps;Seated Shoulder ABduction: AROM;Left;10 reps;Seated Shoulder Horizontal ADduction: AROM;Left;10 reps;Seated General Exercises - Lower Extremity Long Arc Quad: AROM;Left;10 reps;Seated Hip Flexion/Marching: AROM;Both;10 reps;Seated    General Comments General comments (skin integrity, edema, etc.): Pt occassionally uses RUE to assist w/ opening door w/ RUE.  Educated pt on need to add thickener to liquids and to ask RN if he needs assist w/ this, pt verbalized understanding and RN notified of pt coughing after drinking thin liquids on PT arrival.      Pertinent Vitals/Pain Pain Assessment: No/denies pain    Home Living           Entrance Stairs-Rails: None Home Layout: Multi-level;1/2 bath on main level;Bed/bath upstairs        Prior Function            PT Goals (current goals can now be found in the care plan section) Acute Rehab PT Goals Patient Stated Goal:  none stated Progress towards PT goals: Progressing toward goals    Frequency  Min 4X/week    PT Plan Current plan remains appropriate    Co-evaluation             End of Session Equipment Utilized During Treatment: Gait belt Activity Tolerance: Patient tolerated treatment well Patient left: in chair;with call bell/phone within reach;with chair alarm set;with nursing/sitter in room     Time:  1401-1418 PT Time Calculation (min) (ACUTE ONLY): 17 min  Charges:  $Gait Training: 8-22 mins                    G Codes:      Joslyn Hy PT, [place] [phone] Pager: [phone]  01/26/2015, 3:46 PM

## 2015-01-26 NOTE — Progress Notes (Signed)
Speech Language Pathology Treatment: Dysphagia  Patient Details Name: Dennis [name] MRN: [id number] DOB: 1960/11/02 Today's Date: 01/26/2015 Time: 1103-1594 SLP Time Calculation (min) (ACUTE ONLY): 30 min  Assessment / Plan / Recommendation Clinical Impression  Pt is extremely thin, continues to have a delayed, strong cough after thin liquids (moreso when using a straw), and has frequent small burps after each sip of water.  Suspect a primary esophageal dysphagia, with possible pharyngeal component, with s/s of aspiration.  Recommend MBS prior to advancing to thin liquids.  Pt may also benefit from an Esophagram.   HPI HPI: 54 year old male with PMH of heart failure, cocaine abuse, DM, PNA, tonsillectomy, adenoidectomy, tobacco/drug use, medication noncompliance found with facial droop and inability to use left side. CT with question of right MCA territory infarct. Urine drug screen positive for cocaine.    Pertinent Vitals Pain Assessment: No/denies pain  SLP Plan  Continue with current plan of care;MBS    Recommendations Diet recommendations: Dysphagia 3 (mechanical soft);Nectar-thick liquid Liquids provided via: Cup;No straw Medication Administration: Whole meds with liquid Supervision: Patient able to self feed;Intermittent supervision to cue for compensatory strategies Compensations: Slow rate;Small sips/bites;Check for pocketing Postural Changes and/or Swallow Maneuvers: Seated upright 90 degrees              Oral Care Recommendations: Oral care BID Plan: Continue with current plan of care;MBS    GO     Quinn Axe T 01/26/2015, 1:24 PM

## 2015-01-26 NOTE — Progress Notes (Signed)
I discussed with Dr. Candiss Norse. I await insurance approval to admit pt to inpt rehab today or tomorrow pending insurance approval. [phone]

## 2015-01-26 NOTE — Progress Notes (Signed)
Echocardiogram 2D Echocardiogram has been performed.  BROWN, CALEBA 01/26/2015, 12:00 PM

## 2015-01-26 NOTE — Clinical Social Work Note (Signed)
Clinical Social Worker noted patient is discharging to CIR/ IP REHAB today.  Clinical Social Worker will sign off for now as social work intervention is no longer needed. Please consult [place] again if new need arises.  Glendon Axe, MSW, LCSWA [phone] 01/26/2015 3:06 PM

## 2015-01-26 NOTE — Progress Notes (Signed)
Charlett Blake, MD Physician Signed Physical Medicine and Rehabilitation Consult Note 01/25/2015 12:11 PM  Related encounter: ED to Hosp-Admission (Current) from 01/24/2015 in [place] Collapse All        Physical Medicine and Rehabilitation Consult   Reason for Consult: Left sided weakness, left facial droop and slurred speech Referring Physician: Dr. Candiss Norse   HPI: Story Vanvranken is a 54 y.o. male with history of chronic systolic HF, OSA, DM type 2, Left ventricular non-compaction on chronic coumadin, s/p ICD, medication non-compliance; who was found down by family with slurred speech, left facial droop and left sided weakness. Blood sugar- 536 and UDS positive for cocaine. CT head with early signs of R-MCA infarct. Dr. Armida Sans consulted and recommended full workup for likely cardio-embolic stroke. Swallow evaluation done revealing mild oropharyngeal dysphagia and patient placed on DIII, nectar liquids. Cognitive evaluation done revealing impulsivity as well as subtle short term memory deficits. CIR recommended by MD and ST for follow up therapy.   Fiance as well as mother at bedside. Review of Systems  HENT: Negative for hearing loss.  Eyes: Negative for blurred vision and double vision.  Respiratory: Positive for shortness of breath. Negative for cough and wheezing.  Cardiovascular: Negative for chest pain and palpitations.  Neurological: Positive for speech change and focal weakness. Negative for headaches.      Past Medical History  Diagnosis Date  . Chronic systolic heart failure     a) Mixed ICM/NICM b) RHC (05/2014): RA 2, RV 19/2/3, PA 22/14 (18), PCWP 6, Fick CO/CI: 5.2 / 2.7, PVR 2.3 WU, PA 60% and 64% c) ECHO (05/2014): EF 20-25%, diff HK, akinesis entireanteroseptal myocardium, triv AI, mod MR, LA mod/sev dilated  . Automatic implantable cardioverter-defibrillator in situ   . Heart murmur   .  Sleep apnea     "cleared after T&A"  . Left ventricular noncompaction   . Ischemic cardiomyopathy     a) Coronary angiography (12/2008) at [facility]: Lmain: nl, LAD mid 100% stenosis with left to left and right-to-left collaterals to the distal LAD; Lcx: nl, RCA nl.   . Type II diabetes mellitus   . High cholesterol   . Pneumonia 1988  . Drug abuse and dependence   . CHF (congestive heart failure)     Past Surgical History  Procedure Laterality Date  . Forearm fracture surgery Left 1980  . Cardiac defibrillator placement  03/2011  . Cholecystectomy  1994  . Right heart catheterization N/A 05/30/2014    Procedure: RIGHT HEART CATH; Surgeon: Jolaine Artist, MD; Location: [facility] CATH LAB; Service: Cardiovascular; Laterality: N/A;  . Fracture surgery    . Cardiac catheterization  05/2014  . Tonsillectomy and adenoidectomy  ~ 1998    Family History  Problem Relation Age of Onset  . Diabetes Mother   . Heart disease Mother   . Diabetes Father   . Prostate cancer Father   . Heart disease Father   . Diabetes Brother   . Diabetes Brother     Social History: Single. Lives with mother. Unemployed. reports that he has been smoking Cigarettes. He has a 3.1 pack-year smoking history. He has never used smokeless tobacco. He reports that he drinks alcohol occasionally. He reports that he last used Cocaine a few weeks ago.    Allergies: No Known Allergies    Medications Prior to Admission  Medication Sig Dispense Refill  . acetaminophen (TYLENOL) 500 MG tablet Take  1,000 mg by mouth every 6 (six) hours as needed for moderate pain.    . carvedilol (COREG) 3.125 MG tablet Take 1 tablet (3.125 mg total) by mouth 2 (two) times daily with a meal. 60 tablet 3  . digoxin (LANOXIN) 0.125 MG tablet Take 1 tablet (0.125 mg total) by mouth daily. 30 tablet 3  . furosemide  (LASIX) 40 MG tablet Take 1 tablet (40 mg total) by mouth every other day. 45 tablet 3  . Insulin Glargine (LANTUS) 100 UNIT/ML Solostar Pen Inject 32 Units into the skin daily at 10 pm. 15 mL 11  . traMADol (ULTRAM) 50 MG tablet Take 1 tablet (50 mg total) by mouth every 12 (twelve) hours as needed for moderate pain. 14 tablet 0  . warfarin (COUMADIN) 5 MG tablet Take 10 mg by mouth daily. Patient states he takes 10mg  every day per patient. Has not taken in two weeks per patient    . atorvastatin (LIPITOR) 40 MG tablet Take 1 tablet (40 mg total) by mouth daily at 6 PM. (Patient not taking: Reported on 01/24/2015) 30 tablet 6  . glucose blood (ONETOUCH VERIO) test strip Use as instructed 100 each 12  . metFORMIN (GLUCOPHAGE) 500 MG tablet Take 1 tablet (500 mg total) by mouth 2 (two) times daily with a meal. (Patient not taking: Reported on 12/03/2014) 180 tablet 3  . QC PEN NEEDLES 31G X 6 MM MISC 1 each by Other route See admin instructions. Use daily with insulin.    Marland Kitchen warfarin (COUMADIN) 5 MG tablet Take as directed by Coumadin clinic (Patient not taking: Reported on 12/03/2014) 50 tablet 0    Home: Home Living Family/patient expects to be discharged to:: Unsure Living Arrangements: Parent Available Help at Discharge: Family, Available 24 hours/day Type of Home: House Home Access: Stairs to enter [employer] of Steps: 3 Entrance Stairs-Rails: Right, Left Home Layout: Multi-level, Able to live on main level with bedroom/bathroom Home Equipment: Tub bench Lives With: Family  Functional History: Prior Function Level of Independence: Independent Functional Status:  Mobility: Bed Mobility Overal bed mobility: Needs Assistance Bed Mobility: Supine to Sit Supine to sit: Min assist, HOB elevated General bed mobility comments: Min assist for truncal balance. HOB elevated. Cues for technique. Performed towards patients Left  side. Transfers Overall transfer level: Needs assistance Equipment used: None Transfers: Sit to/from Stand Sit to Stand: Min assist General transfer comment: Min assist for balance from lowest bed setting. Cues for technique. Mild buckling of LLE however able to self correct.  Ambulation/Gait Ambulation/Gait assistance: Min assist Ambulation Distance (Feet): 20 Feet Assistive device: 1 person hand held assist Gait Pattern/deviations: Step-to pattern, Decreased step length - right, Decreased step length - left, Decreased stance time - left, Decreased stride length, Decreased dorsiflexion - left, Decreased weight shift to left, Shuffle Gait velocity: decreased Gait velocity interpretation: Below normal speed for age/gender General Gait Details: Min assist for balance on Pts left side. Mild buckling noted at times of Lt knee however able to self correct. Tends to drag LLE behind him. Cues for hip flexion and Lt ankle clearance. Able to stand to urinate with min guard assist. Picked up an item on the floor with min assist for balance. Somewhat impulsive, needs cues to take his time and concentrate on task at hand due to some lack of awareness of deficits.    ADL:    Cognition: Cognition Overall Cognitive Status: Within Functional Limits for tasks assessed Arousal/Alertness: Awake/alert Orientation Level: Oriented X4  Attention: Selective Selective Attention: Appears intact Memory: Appears intact (during formal testing, ? slight difficulty with basic carryo) Awareness: Appears intact Problem Solving: Appears intact Safety/Judgment: Appears intact Cognition Arousal/Alertness: Lethargic Behavior During Therapy: Impulsive Overall Cognitive Status: Within Functional Limits for tasks assessed  Blood pressure 91/55, pulse 93, temperature 97.5 F (36.4 C), temperature source Oral, resp. rate 16, height 6' (1.829 m), weight 60.011 kg (132 lb 4.8 oz), SpO2 100 %. Physical Exam  Nursing note  and vitals reviewed. Constitutional: He is oriented to person, place, and time. He appears well-developed and well-nourished. He appears lethargic. He is easily aroused.  HENT:  Head: Normocephalic and atraumatic.  Eyes: Conjunctivae are normal. Pupils are equal, round, and reactive to light.  Neck: Normal range of motion. Neck supple.  Cardiovascular: Normal rate and regular rhythm.  Respiratory: Effort normal. No respiratory distress. He has no wheezes.  GI: Soft. Bowel sounds are normal. He exhibits no distension. There is no tenderness.  Neurological: He is oriented to person, place, and time and easily aroused. He appears lethargic.  Had difficulty staying awake but was able to follow basic commands without difficulty. Mild dysarthria noted. Left hemiparesis.  Skin: Skin is warm and dry.  Patient is alert currently. Motor strength is 5/5 in the deltoid biceps triceps grip hip flexor and knee extensor ankle dorsiflexor Left upper extremity 4/5 in the deltoid, biceps, triceps, grip, hip flexor, knee extensor, ankle dorsiflexor and plantar flexor. Sensation intact to light touch in bilateral upper and lower limbs\ Visual fields are intact to confrontation testing he exhibits mild left neglect   Lab Results Last 24 Hours    Results for orders placed or performed during the hospital encounter of 01/24/15 (from the past 24 hour(s))  CBG monitoring, ED Status: Abnormal   Collection Time: 01/24/15 4:05 PM  Result Value Ref Range   Glucose-Capillary 367 (H) 70 - 99 mg/dL  Glucose, capillary Status: Abnormal   Collection Time: 01/24/15 7:56 PM  Result Value Ref Range   Glucose-Capillary 281 (H) 70 - 99 mg/dL   Comment 1 Notify RN   Brain natriuretic peptide Status: Abnormal   Collection Time: 01/24/15 8:05 PM  Result Value Ref Range   B Natriuretic Peptide 166.1 (H) 0.0 - 100.0 pg/mL  Glucose, capillary Status: Abnormal    Collection Time: 01/24/15 11:50 PM  Result Value Ref Range   Glucose-Capillary 198 (H) 70 - 99 mg/dL  Glucose, capillary Status: Abnormal   Collection Time: 01/25/15 4:13 AM  Result Value Ref Range   Glucose-Capillary 62 (L) 70 - 99 mg/dL  Glucose, capillary Status: Abnormal   Collection Time: 01/25/15 4:35 AM  Result Value Ref Range   Glucose-Capillary 178 (H) 70 - 99 mg/dL   Comment 1 Notify RN   Lipid panel Status: Abnormal   Collection Time: 01/25/15 5:35 AM  Result Value Ref Range   Cholesterol 197 0 - 200 mg/dL   Triglycerides 42 <150 mg/dL   HDL 73 >39 mg/dL   Total CHOL/HDL Ratio 2.7 RATIO   VLDL 8 0 - 40 mg/dL   LDL Cholesterol 116 (H) 0 - 99 mg/dL  Basic metabolic panel Status: Abnormal   Collection Time: 01/25/15 5:35 AM  Result Value Ref Range   Sodium 140 135 - 145 mmol/L   Potassium 4.1 3.5 - 5.1 mmol/L   Chloride 104 96 - 112 mmol/L   CO2 25 19 - 32 mmol/L   Glucose, Bld 169 (H) 70 - 99 mg/dL   BUN 16  6 - 23 mg/dL   Creatinine, Ser 0.88 0.50 - 1.35 mg/dL   Calcium 9.5 8.4 - 10.5 mg/dL   GFR calc non Af Amer >90 >90 mL/min   GFR calc Af Amer >90 >90 mL/min   Anion gap 11 5 - 15  Glucose, capillary Status: Abnormal   Collection Time: 01/25/15 7:47 AM  Result Value Ref Range   Glucose-Capillary 145 (H) 70 - 99 mg/dL  Glucose, capillary Status: Abnormal   Collection Time: 01/25/15 12:22 PM  Result Value Ref Range   Glucose-Capillary 280 (H) 70 - 99 mg/dL      Imaging Results (Last 48 hours)    Ct Angio Head W/cm &/or Wo Cm  01/25/2015 CLINICAL DATA: 54 year old male stroke patient with left side weakness and difficulty swallowing. Not a candidate for thrombolysis. Cannot have MRI. Initial encounter. EXAM: CT ANGIOGRAPHY HEAD AND NECK TECHNIQUE: Multidetector CT imaging of the head and neck was  performed using the standard protocol during bolus administration of intravenous contrast. Multiplanar CT image reconstructions and MIPs were obtained to evaluate the vascular anatomy. Carotid stenosis measurements (when applicable) are obtained utilizing NASCET criteria, using the distal internal carotid diameter as the denominator. CONTRAST: 83mL OMNIPAQUE IOHEXOL 350 MG/ML SOLN COMPARISON: Head CT without contrast 01/24/2015. FINDINGS: CT HEAD Brain: Evolved right MCA anterior division infarct with progressive gray and white matter hypodensity corresponding to the insula and anterior operculum, in the area questioned by CT yesterday. No acute intracranial hemorrhage identified. No associated mass effect. No ventriculomegaly. Stable gray-white matter differentiation elsewhere. Calvarium and skull base: Negative. Paranasal sinuses: Visualized paranasal sinuses and mastoids are clear. Orbits: Negative. CTA NECK Other neck: Left chest cardiac pacemaker with mild streak artifact. Paraseptal and centrilobular emphysema. No superior mediastinal lymphadenopathy. Thyroid, larynx, pharynx, parapharyngeal spaces, retropharyngeal space and sublingual space are within normal limits. Diffuse increased attenuation of subcutaneous fat and superficial soft tissues such that there is portal in a shin of the submandibular and parotid glands, as well as the muscle layers. No cervical lymphadenopathy identified. Skeleton: Degenerative changes in the cervical spine. No acute osseous abnormality identified. Aortic arch: 3 vessel arch configuration. Mild distal arch calcified plaque. No great vessel origin stenosis. Right carotid system: Negative right CCA. Widely patent right carotid bifurcation with minimal calcified plaque (series 504, image 99). Negative cervical right ICA. Left carotid system: Negative left CCA. Negative left carotid bifurcation. Negative cervical left ICA. Vertebral arteries: No proximal right  subclavian artery stenosis. Paravertebral venous contrast reflux obscures the proximal right vertebral artery and its origin. The right V2 segment is normal. Negative right vertebral artery at the skullbase. No proximal left subclavian artery stenosis. Non dominant appearing left vertebral artery has a normal origin, and is normal to the skullbase. CTA HEAD Posterior circulation: Mildly dominant distal right vertebral artery. Normal PICA origins. Normal vertebrobasilar junction. No basilar artery stenosis. Normal SCA and left PCA origins. Fetal type right PCA origin. Left posterior communicating artery is diminutive or absent. Bilateral PCA branches are within normal limits. Anterior circulation: Negative left ICA siphon. Normal left ophthalmic artery origin. Negative right ICA siphon. Normal right ophthalmic and posterior communicating artery origins. Normal carotid termini. Normal MCA and ACA origins. Diminutive or absent anterior communicating artery. Bilateral ACA branches are within normal limits. Left MCA branches are within normal limits. Right MCA origin is normal and M1 segment is patent. Right MCA bifurcation is patent. There is then filling defect and irregularity of proximal right MCA M2 branches, with subtotal loss of  enhancement of the proximal anterior sylvian division best seen on series 506, image 16. Despite this, there is preserved more distal right M2 and M3 enhancement. No major right MCA branch occlusion is identified. Venous sinuses: Negative. Anatomic variants: Mildly dominant right vertebral artery. Fetal type right PCA origin. Delayed phase: No abnormal enhancement identified. IMPRESSION: 1. Evolving anterior division right MCA infarct. No hemorrhage or mass effect. 2. Associated filling defect just beyond the right MCA bifurcation affecting the proximal right M2 branches compatible with thromboembolic disease. Despite this, no major right MCA branch occlusion is identified. 3.  Otherwise negative intracranial CTA. 4. Minimal atherosclerosis in the neck and aortic arch. 5. Emphysema. Electronically Signed By: Genevie Ann M.D. On: 01/25/2015 14:31   Ct Head Wo Contrast  01/24/2015 CLINICAL DATA: Awoke with left-sided weakness and difficulty swallowing. EXAM: CT HEAD WITHOUT CONTRAST TECHNIQUE: Contiguous axial images were obtained from the base of the skull through the vertex without intravenous contrast. COMPARISON: None. FINDINGS: The brainstem and cerebellum are unremarkable. I think there is debatable loss of gray-white differentiation in the insular region on the right. There may be early change in the posterior frontal region. Basal ganglia appear intact. No sign of hemorrhage, hydrocephalus or extra-axial collection. No calvarial abnormality. Visualized sinuses are clear. IMPRESSION: Question loss of gray-white differentiation in the insula and posterior frontal region on the right that could go along with the earliest CT manifestation of acute infarction in the right middle cerebral artery territory. No hemorrhage. Electronically Signed By: Nelson Chimes M.D. On: 01/24/2015 16:08   Ct Angio Neck W/cm &/or Wo/cm  01/25/2015 CLINICAL DATA: 54 year old male stroke patient with left side weakness and difficulty swallowing. Not a candidate for thrombolysis. Cannot have MRI. Initial encounter. EXAM: CT ANGIOGRAPHY HEAD AND NECK TECHNIQUE: Multidetector CT imaging of the head and neck was performed using the standard protocol during bolus administration of intravenous contrast. Multiplanar CT image reconstructions and MIPs were obtained to evaluate the vascular anatomy. Carotid stenosis measurements (when applicable) are obtained utilizing NASCET criteria, using the distal internal carotid diameter as the denominator. CONTRAST: 18mL OMNIPAQUE IOHEXOL 350 MG/ML SOLN COMPARISON: Head CT without contrast 01/24/2015. FINDINGS: CT HEAD Brain: Evolved right MCA  anterior division infarct with progressive gray and white matter hypodensity corresponding to the insula and anterior operculum, in the area questioned by CT yesterday. No acute intracranial hemorrhage identified. No associated mass effect. No ventriculomegaly. Stable gray-white matter differentiation elsewhere. Calvarium and skull base: Negative. Paranasal sinuses: Visualized paranasal sinuses and mastoids are clear. Orbits: Negative. CTA NECK Other neck: Left chest cardiac pacemaker with mild streak artifact. Paraseptal and centrilobular emphysema. No superior mediastinal lymphadenopathy. Thyroid, larynx, pharynx, parapharyngeal spaces, retropharyngeal space and sublingual space are within normal limits. Diffuse increased attenuation of subcutaneous fat and superficial soft tissues such that there is portal in a shin of the submandibular and parotid glands, as well as the muscle layers. No cervical lymphadenopathy identified. Skeleton: Degenerative changes in the cervical spine. No acute osseous abnormality identified. Aortic arch: 3 vessel arch configuration. Mild distal arch calcified plaque. No great vessel origin stenosis. Right carotid system: Negative right CCA. Widely patent right carotid bifurcation with minimal calcified plaque (series 504, image 99). Negative cervical right ICA. Left carotid system: Negative left CCA. Negative left carotid bifurcation. Negative cervical left ICA. Vertebral arteries: No proximal right subclavian artery stenosis. Paravertebral venous contrast reflux obscures the proximal right vertebral artery and its origin. The right V2 segment is normal. Negative right vertebral artery at the skullbase.  No proximal left subclavian artery stenosis. Non dominant appearing left vertebral artery has a normal origin, and is normal to the skullbase. CTA HEAD Posterior circulation: Mildly dominant distal right vertebral artery. Normal PICA origins. Normal vertebrobasilar  junction. No basilar artery stenosis. Normal SCA and left PCA origins. Fetal type right PCA origin. Left posterior communicating artery is diminutive or absent. Bilateral PCA branches are within normal limits. Anterior circulation: Negative left ICA siphon. Normal left ophthalmic artery origin. Negative right ICA siphon. Normal right ophthalmic and posterior communicating artery origins. Normal carotid termini. Normal MCA and ACA origins. Diminutive or absent anterior communicating artery. Bilateral ACA branches are within normal limits. Left MCA branches are within normal limits. Right MCA origin is normal and M1 segment is patent. Right MCA bifurcation is patent. There is then filling defect and irregularity of proximal right MCA M2 branches, with subtotal loss of enhancement of the proximal anterior sylvian division best seen on series 506, image 16. Despite this, there is preserved more distal right M2 and M3 enhancement. No major right MCA branch occlusion is identified. Venous sinuses: Negative. Anatomic variants: Mildly dominant right vertebral artery. Fetal type right PCA origin. Delayed phase: No abnormal enhancement identified. IMPRESSION: 1. Evolving anterior division right MCA infarct. No hemorrhage or mass effect. 2. Associated filling defect just beyond the right MCA bifurcation affecting the proximal right M2 branches compatible with thromboembolic disease. Despite this, no major right MCA branch occlusion is identified. 3. Otherwise negative intracranial CTA. 4. Minimal atherosclerosis in the neck and aortic arch. 5. Emphysema. Electronically Signed By: Genevie Ann M.D. On: 01/25/2015 14:31     Assessment/Plan: Diagnosis: Right ACA infarct probably cardioembolic with left hemiparesis and left neglect as well as cognitive deficits 1. Does the need for close, 24 hr/day medical supervision in concert with the patient's rehab needs make it unreasonable for this patient to be served in a  less intensive setting? Yes 2. Co-Morbidities requiring supervision/potential complications: Cocaine abuse, hypertension, type 2 diabetes, dysphagia 3. Due to bladder management, bowel management, safety, skin/wound care, disease management, medication administration, pain management and patient education, does the patient require 24 hr/day rehab nursing? Yes 4. Does the patient require coordinated care of a physician, rehab nurse, PT (1-2 hrs/day, 5 days/week), OT (1-2 hrs/day, 5 days/week) and SLP (0.5-1 hrs/day, 55 days/week) to address physical and functional deficits in the context of the above medical diagnosis(es)? Yes Addressing deficits in the following areas: balance, endurance, locomotion, strength, transferring, bowel/bladder control, bathing, dressing, feeding, grooming, toileting, cognition, speech, language, swallowing and psychosocial support 5. Can the patient actively participate in an intensive therapy program of at least 3 hrs of therapy per day at least 5 days per week? Yes 6. The potential for patient to make measurable gains while on inpatient rehab is excellent 7. Anticipated functional outcomes upon discharge from inpatient rehab are supervision with PT, supervision with OT, supervision with SLP. 8. Estimated rehab length of stay to reach the above functional goals is: 10-14 days 9. Does the patient have adequate social supports and living environment to accommodate these discharge functional goals? Yes 10. Anticipated D/C setting: Home 11. Anticipated post D/C treatments: [facility] therapy 12. Overall Rehab/Functional Prognosis: good  RECOMMENDATIONS: This patient's condition is appropriate for continued rehabilitative care in the following setting: CIR Patient has agreed to participate in recommended program. Yes Note that insurance prior authorization may be required for reimbursement for recommended care.  Comment:     01/25/2015  Revision History      Date/Time User Provider Type Action   01/25/2015 4:07 PM Charlett Blake, MD Physician Sign   01/25/2015 2:49 PM Bary Leriche, PA-C Physician Assistant Share   01/25/2015 2:48 PM Bary Leriche, PA-C Physician Assistant Share   View Details Report       Routing History     Date/Time From To Method   01/25/2015 4:07 PM Charlett Blake, MD Charlett Blake, MD In Combine   01/25/2015 4:07 PM Charlett Blake, MD [place], FNP In Basket

## 2015-01-26 NOTE — Discharge Instructions (Signed)
Follow with Primary MD Dennis Po, FNP in 7 days   Get CBC, CMP, 2 view Chest X ray checked  by Primary MD next visit.   Monitor INR closely  Activity: As tolerated with Full fall precautions use walker/cane & assistance as needed   Disposition CIR   Diet:  Dysphagia 3 with nectar thick liquids with feeding assistance and aspiration precautions.  For Heart failure patients - Check your Weight same time everyday, if you gain over 2 pounds, or you develop in leg swelling, experience more shortness of breath or chest pain, call your Primary MD immediately. Follow Cardiac Low Salt Diet and 1.5 lit/day fluid restriction.   On your next visit with your primary care physician please Get Medicines reviewed and adjusted.   Please request your Prim.MD to go over all Hospital Tests and Procedure/Radiological results at the follow up, please get all Hospital records sent to your Prim MD by signing hospital release before you go home.   If you experience worsening of your admission symptoms, develop shortness of breath, life threatening emergency, suicidal or homicidal thoughts you must seek medical attention immediately by calling 911 or calling your MD immediately  if symptoms less severe.  You Must read complete instructions/literature along with all the possible adverse reactions/side effects for all the Medicines you take and that have been prescribed to you. Take any new Medicines after you have completely understood and accpet all the possible adverse reactions/side effects.   Do not drive, operating heavy machinery, perform activities at heights, swimming or participation in water activities or provide baby sitting services if your were admitted for syncope or siezures until you have seen by Primary MD or a Neurologist and advised to do so again.  Do not drive when taking Pain medications.    Do not take more than prescribed Pain, Sleep and Anxiety Medications  Special Instructions:  If you have smoked or chewed Tobacco  in the last 2 yrs please stop smoking, stop any regular Alcohol  and or any Recreational drug use.  Wear Seat belts while driving.   Please note  You were cared for by a hospitalist during your hospital stay. If you have any questions about your discharge medications or the care you received while you were in the hospital after you are discharged, you can call the unit and asked to speak with the hospitalist on call if the hospitalist that took care of you is not available. Once you are discharged, your primary care physician will handle any further medical issues. Please note that NO REFILLS for any discharge medications will be authorized once you are discharged, as it is imperative that you return to your primary care physician (or establish a relationship with a primary care physician if you do not have one) for your aftercare needs so that they can reassess your need for medications and monitor your lab values.

## 2015-01-26 NOTE — PMR Pre-admission (Signed)
PMR Admission Coordinator Pre-Admission Assessment  Patient: Dennis [name] is an 54 y.o., male MRN: [id number] DOB: 1961-06-16 Height: 6' (182.9 cm) Weight: 67.223 kg (148 lb 3.2 oz)              Insurance Information HMO: yes    PPO:      PCP:      IPA:      80/20:      OTHER:medicare replacement PRIMARY: Bubba Hales      Policy#: [id number]      Subscriber: pt CM Name: Lorel Monaco      Phone#: [phone] ext [phone]     Fax#: [phone] Pre-Cert#: [id number]      Employer: disabled approved for 7 days to be followed by onsite CM, Gaylan Gerold at [phone] Benefits:  Phone #: [phone]     Name: 4/14 Eff. Date: 10/13/14     Deduct: none      Out of Pocket Max: $4900      Life Max: none CIR: $345 per day days 1-5 then covers 100%      SNF: no copay days 1-20; $160 copay per day days 21-51; no copay days 52-100 Outpatient: $40 copay per visit     Co-Pay: no visit limit Home Health: 100%      Co-Pay: no visit limit DME: 80%     Co-Pay: 20% Providers: in network  SECONDARY: none      Medicaid Application Date:       Case Manager:  Disability Application Date:       Case Worker:   Emergency Contact Information Contact Information    Name Relation Home Work Mobile   Hinners,[place] Mother [phone]     Keith,Pat Significant other   [phone]     Current Medical History  Patient Admitting Diagnosis: 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 male with history of chronic systolic HF, OSA, DM type 2, Left ventricular non-compaction on chronic coumadin, s/p ICD, medication non-compliance; who was found down by family with slurred speech, left facial droop and left sided weakness. Blood sugar- 536 and UDS positive for cocaine. CT head with early signs of R-MCA infarct. Dr. Armida Sans consulted and recommended full workup for likely cardio-embolic stroke. Swallow  evaluation done revealing mild oropharyngeal dysphagia and patient placed on DIII, nectar liquids. Cognitive evaluation done revealing impulsivity as well as subtle short term memory deficits.  Repeat CT showed infarct size not big, per Neuro so recommend safe to start coumadin on 4/15 with INR goal to be reached in 3-5 days. Once INR 2-3, aspirin can be stopped. Pharmacy to dose.    Total: 4 [facility]    Past Medical History  Past Medical History  Diagnosis Date  . Chronic systolic heart failure     a) Mixed ICM/NICM b) RHC (05/2014): RA 2, RV 19/2/3, PA 22/14 (18), PCWP 6, Fick CO/CI: 5.2 / 2.7, PVR 2.3 WU, PA 60% and 64% c) ECHO (05/2014): EF 20-25%, diff HK, akinesis entireanteroseptal myocardium, triv AI, mod MR, LA mod/sev dilated  . Automatic implantable cardioverter-defibrillator in situ   . Heart murmur   . Sleep apnea     "cleared after T&A"  . Left ventricular noncompaction   . Ischemic cardiomyopathy     a) Coronary angiography (12/2008) at [facility]: Lmain: nl, LAD mid 100% stenosis with left to left and right-to-left collaterals to the distal LAD; Lcx:  nl, RCA nl.    . Type II diabetes mellitus   . High cholesterol   . Pneumonia 1988  . Drug abuse and dependence   . CHF (congestive heart failure)     Family History  family history includes Diabetes in his brother, brother, father, and mother; Heart disease in his father and mother; Prostate cancer in his father.  Prior Rehab/Hospitalizations: none  Current Medications   Current facility-administered medications:  .  albuterol (PROVENTIL) (2.5 MG/3ML) 0.083% nebulizer solution 2.5 mg, 2.5 mg, Nebulization, Q4H PRN, Thurnell Lose, MD, 2.5 mg at 01/25/15 1851 .  aspirin EC tablet 81 mg, 81 mg, Oral, Daily, Amie Portland, MD, 81 mg at 01/26/15 0945 .  atorvastatin (LIPITOR) tablet 20 mg, 20 mg, Oral, q1800, Thurnell Lose, MD, 20 mg at 01/25/15 1257 .  heparin injection 5,000 Units, 5,000 Units, Subcutaneous, 3 times per  day, Thurnell Lose, MD, 5,000 Units at 01/26/15 1418 .  insulin aspart (novoLOG) injection 0-20 Units, 0-20 Units, Subcutaneous, TID WC, Thurnell Lose, MD, 15 Units at 01/26/15 1148 .  insulin detemir (LEVEMIR) injection 10 Units, 10 Units, Subcutaneous, QHS, Annita Brod, MD, 10 Units at 01/25/15 2153 .  RESOURCE THICKENUP CLEAR, , Oral, PRN, Thurnell Lose, MD .  senna-docusate (Senokot-S) tablet 1 tablet, 1 tablet, Oral, QHS PRN, Annita Brod, MD .  warfarin (COUMADIN) tablet 10 mg, 10 mg, Oral, ONCE-1800, Rachel L Rumbarger, RPH .  Warfarin - Pharmacist Dosing Inpatient, , Does not apply, q1800, Skeet Simmer, RPH, 0  at 01/25/15 2000  Patients Current Diet: DIET DYS 3 Room service appropriate?: Yes; Fluid consistency:: Nectar Thick Repeat MBS pending  Precautions / Restrictions Precautions Precautions: Fall Restrictions Weight Bearing Restrictions: No   Prior Activity Level Community (5-7x/wk): goes to Narcotics Anonymous meetings 3 xs per day  Drives and independent. Disabled since 2009  [place] / [place] Devices/Equipment: None Home Equipment: Tub bench  Prior Functional Level Prior Function Level of Independence: Independent Comments: Pt reports he drives and is independent with IADLs   Current Functional Level Cognition  Arousal/Alertness: Awake/alert Overall Cognitive Status: Difficult to assess Difficult to assess due to: Level of arousal Orientation Level: Oriented X4 Attention: Selective Selective Attention: Appears intact Memory: Appears intact (during formal testing, ? slight difficulty with basic carryo) Awareness: Appears intact Problem Solving: Appears intact Safety/Judgment: Appears intact    Extremity Assessment (includes Sensation/Coordination)  Upper Extremity Assessment: LUE deficits/detail LUE Deficits / Details: Pt able to reach to ~80* shoulder flexion.  Elbow flex/ext grossly 3/5; mass grasp and  release  LUE Sensation: decreased light touch LUE Coordination: decreased fine motor, decreased gross motor  Lower Extremity Assessment: Defer to PT evaluation LLE Deficits / Details: >4/5 MMT with knee extension, hip flexion, ankle DF/PF, and hallux flex/ext. LLE Sensation: decreased proprioception (ankle assessed)    ADLs  Overall ADL's : Needs assistance/impaired Eating/Feeding: Supervision/ safety, Set up, Sitting Grooming: Wash/dry hands, Wash/dry face, Oral care, Brushing hair, Minimal assistance, Standing Upper Body Bathing: Moderate assistance, Sitting Lower Body Bathing: Moderate assistance, Sit to/from stand Upper Body Dressing : Moderate assistance, Sitting Lower Body Dressing: Moderate assistance, Sit to/from stand Toilet Transfer: Minimal assistance, Ambulation, Comfort height toilet, Grab bars Toileting- Clothing Manipulation and Hygiene: Moderate assistance, Sit to/from stand Functional mobility during ADLs: Minimal assistance General ADL Comments: Pt is very impulsive.  He is lethargic with difficulty sustaining arousal, but does make significant effort to stay awake.  He is slow to initiate activity     Mobility  Overal bed mobility: Needs Assistance Bed Mobility: Supine to Sit Supine to sit: Mod assist General bed mobility comments: Pt requires assist to move LEs to EOB and assist to lift shoulder from bed     Transfers  Overall transfer level: Needs assistance Equipment used: None Transfers: Sit to/from Stand, Stand Pivot Transfers Sit to Stand: Min assist Stand pivot transfers: Min assist General transfer comment: Pt requires min A for balance and safety.  he is impulsive     Ambulation / Gait / Stairs / Emergency planning/management officer  Ambulation/Gait Ambulation/Gait assistance: Museum/gallery curator (Feet): 20 Feet Assistive device: 1 person hand held assist Gait Pattern/deviations: Step-to pattern, Decreased step length - right, Decreased step length - left,  Decreased stance time - left, Decreased stride length, Decreased dorsiflexion - left, Decreased weight shift to left, Shuffle Gait velocity: decreased Gait velocity interpretation: Below normal speed for age/gender General Gait Details: Min assist for balance on Pts left side. Mild buckling noted at times of Lt knee however able to self correct. Tends to drag LLE behind him. Cues for hip flexion and Lt ankle clearance. Able to stand to urinate with min guard assist. Picked up an item on the floor with min assist for balance. Somewhat impulsive, needs cues to take his time and concentrate on task at hand due to some lack of awareness of deficits.    Posture / Balance Balance Overall balance assessment: Needs assistance Sitting-balance support: Feet supported Sitting balance-Leahy Scale: Fair Standing balance support: During functional activity Standing balance-Leahy Scale: Poor Standing balance comment: requires min A     Special needs/care consideration Bowel mgmt: continent Bladder mgmt: continent Diabetic mgmt CBG 536 on admission Hgb A1 C 10.5 09/2014   Previous Home Environment Living Arrangements: Parent  Lives With: Family Available Help at Discharge: Family, Available 24 hours/day Type of Home: House Home Layout: Multi-level, 1/2 bath on main level, Bed/bath upstairs Alternate Level Stairs-Rails: Right, Left, Can reach both Alternate Level Stairs-Number of Steps: 12 Home Access: Stairs to enter Entrance Stairs-Rails: None Entrance Stairs-Number of Steps: 1 step into back entry Bathroom Shower/Tub: Tub/shower unit, Architectural technologist: Standard Bathroom Accessibility: Yes How Accessible: Accessible via walker [place]: No  Discharge Living Setting Plans for Discharge Living Setting: Lives with (comment), Other (Comment) (Mom) Type of Home at Discharge: House Discharge Home Layout: Multi-level, 1/2 bath on main level, Bed/bath upstairs Alternate Level  Stairs-Rails: Right, Left, Can reach both Alternate Level Stairs-Number of Steps: 12 Discharge Home Access: Stairs to enter Entrance Stairs-Rails: None Entrance Stairs-Number of Steps: 1 step in back Discharge Bathroom Shower/Tub: Tub/shower unit, Curtain Discharge Bathroom Toilet: Standard Discharge Bathroom Accessibility: Yes How Accessible: Accessible via walker Does the patient have any problems obtaining your medications?: Yes (Describe) (Numerous calls to PCP for assist with copays. Noncompliance )  Social/Family/Support Systems Patient Roles: Partner, Parent (Has 16 year old daughter and has girlfriend, Electrical engineer) Sport and exercise psychologist Information: Viann Fish, Mom and GF, Electrical engineer Anticipated Caregiver: Mom and girlfriend , Pat Anticipated Ambulance person Information: see above Ability/Limitations of Caregiver: Mom can provide supervision only Caregiver Availability: 24/7 Discharge Plan Discussed with Primary Caregiver: Yes Is Caregiver In Agreement with Plan?: Yes Does Caregiver/Family have Issues with Lodging/Transportation while Pt is in Rehab?: No  Goals/Additional Needs Patient/Family Goal for Rehab: Supervision with PT, OT, and SLP Expected length of stay: ELOS 10- 14 days Special Service Needs: UDS positive cocaine on admission. Pt states  he goes to narcotics [facility] 3 times per day. Mom and GF are aware that he goes to [facility]. I did not discuss UDS positive for cocaine on admission. I did tell him he is drug screened each admission. He did not ask for results Pt/Family Agrees to Admission and willing to participate: Yes Program Orientation Provided & Reviewed with Pt/Caregiver Including Roles  & Responsibilities: Yes  Decrease burden of Care through IP rehab admission: n/a  Possible need for SNF placement upon discharge:not anticipated  Patient Condition: This patient's condition remains as documented in the consult dated 01/25/15 , in which the Rehabilitation Physician determined and documented  that the patient's condition is appropriate for intensive rehabilitative care in an inpatient rehabilitation facility. Will admit to inpatient rehab today.  Preadmission Screen Completed By:  Cleatrice Burke, 01/26/2015 2:20 PM ______________________________________________________________________   Discussed status with Dr. Naaman Plummer on 01/26/15 at  1418 and received telephone approval for admission today.  Admission Coordinator:  Cleatrice Burke, time 0321 Date 01/26/15.

## 2015-01-26 NOTE — Progress Notes (Signed)
Patient is being transferred to [facility].report called to the receiving nurse.

## 2015-01-26 NOTE — Progress Notes (Signed)
I have insurance approval to admit pt to inpt rehab today. I have contacted Dr. Candiss Norse. I will make the arrangements to admit pt to day. [phone]

## 2015-01-26 NOTE — Discharge Summary (Signed)
Dennis [name], is a 54 y.o. male  DOB September 09, 1961  MRN [id number].  Admission date:  01/24/2015  Admitting Physician  Annita Brod, MD  Discharge Date:  01/26/2015   Primary MD  Mauricio Po, FNP  Recommendations for primary care physician for things to follow:   Monitor secondary to his factors for CVA. Needs below mentioned outpatient subspecialist follow-up.   Admission Diagnosis  Cocaine abuse [F14.10] Stroke [I63.9] Left-sided weakness [M62.89] Stroke with cerebral ischemia [I63.9]   Discharge Diagnosis  Cocaine abuse [F14.10] Stroke [I63.9] Left-sided weakness [M62.89] Stroke with cerebral ischemia [I63.9]     Principal Problem:   Acute ischemic stroke Active Problems:   Acute systolic heart failure   Cocaine abuse   H/O noncompliance with medical treatment, presenting hazards to health   Tobacco abuse   Essential hypertension   Hypotension   Hyperlipidemia   Dysphagia following cerebral infarction   Hemiplegia affecting left nondominant side   Diabetes mellitus type 2, uncontrolled, with complications   Left-sided weakness   Stroke with cerebral ischemia   Cardiac LV ejection fraction 10-20%   LV non-compaction cardiomyopathy      Past Medical History  Diagnosis Date  . Chronic systolic heart failure     a) Mixed ICM/NICM b) RHC (05/2014): RA 2, RV 19/2/3, PA 22/14 (18), PCWP 6, Fick CO/CI: 5.2 / 2.7, PVR 2.3 WU, PA 60% and 64% c) ECHO (05/2014): EF 20-25%, diff HK, akinesis entireanteroseptal myocardium, triv AI, mod MR, LA mod/sev dilated  . Automatic implantable cardioverter-defibrillator in situ   . Heart murmur   . Sleep apnea     "cleared after T&A"  . Left ventricular noncompaction   . Ischemic cardiomyopathy     a) Coronary angiography (12/2008) at [facility]: Lmain: nl, LAD mid  100% stenosis with left to left and right-to-left collaterals to the distal LAD; Lcx: nl, RCA nl.    . Type II diabetes mellitus   . High cholesterol   . Pneumonia 1988  . Drug abuse and dependence   . CHF (congestive heart failure)     Past Surgical History  Procedure Laterality Date  . Forearm fracture surgery Left 1980  . Cardiac defibrillator placement  03/2011  . Cholecystectomy  1994  . Right heart catheterization N/A 05/30/2014    Procedure: RIGHT HEART CATH;  Surgeon: Jolaine Artist, MD;  Location: [facility] CATH LAB;  Service: Cardiovascular;  Laterality: N/A;  . Fracture surgery    . Cardiac catheterization  05/2014  . Tonsillectomy and adenoidectomy  ~ 1998       History of present illness and  Hospital Course:     Kindly see H&P for history of present illness and admission details, please review complete Labs, Consult reports and Test reports for all details in brief  HPI  from the history and physical done on the day of admission  Dennis [name] is a 54 y.o. male with past mental history of chronic systolic heart failure with 20% ejection fraction, left ventricular non-compaction on chronic  anticoagulation, cocaine abuse, diabetes mellitus and most notably noncompliance who is been off of all medications for several weeks and was found by his mother today in his house with facial droop and inability to use his left side. Patient states he woke up feeling this way.  In the Emergency room, CT scan of the head noted possible acute infarction in the right middle cerebral artery territory. Lab work was concerning for significant hyperglycemia CBGs in the 500s, borderline hypotension with a systolic blood pressure in the 90s, and a nonexistent INR of 1.02. Patient's urine drug screen was positive for cocaine. Hospitalists and neurology called for further evaluation and admission. Patient did fail bedside nursing swallow evaluation  Hospital Course      1.Dense L Hemiparesis  dysphagia- due to right MCA stroke. Currently on aspirin, statin dose has been increased as LDL was above cone. Counseled to quit smoking and cocaine. Currently on aspirin and statin. Low-dose Coumadin has been started as he has history of left ventricular non-comp actions syndrome and he was supposed to be on Coumadin but had stopped due to noncompliance. Goal is to bring up INR gradually over the next 3-4 days. Thereafter stop aspirin and continue Coumadin. Needs continued PT OT and speech. He is currently on dysphagia 3 diet with nectar thick liquids due to dysphagia from the stroke.  CT angiogram of the neck is stable.   Await final echo report.I was asked by the CIR coordinator to transfer him now and that TTE - pending, to be followed by CIR MD . Will trnasfer him to CIR , CIR MD to follow on pending TTE report.    2. Smoking and cocaine abuse. Counseled to quit.   3. Chr. Systolic heart failure with EF 20%. Has AICD. Baseline systolic blood pressures over the last 1 year have been around 85. Not on ACE/ARB due to low baseline blood pressures. On low-dose Coreg. Request to hold if systolic below 90. Every other day Lasix to be resumed. Again note his baseline blood pressures around 85 systolic.   4.DM2 - on Lantus and sliding scale, poor control. Poor compliance with medications counseled on compliance. Request PCP to continue monitoring glycemic control closely.   Recent Labs    Lab Results  Component Value Date   HGBA1C 10.5* 09/21/2014      CBG (last 3)   Recent Labs (last 2 labs)      Recent Labs  01/25/15 0413 01/25/15 0435 01/25/15 0747  GLUCAP 62* 178* 145*       5. Dyslipidemia. Poor compliance with medications. LDL above 70. Statin dose adjusted.         Discharge Condition: Stable   Follow UP  Follow-up Information    Follow up with Mauricio Po, FNP. Schedule an appointment as soon as possible for a visit in 1 week.   Specialty:   Family Medicine   Contact information:   [place] [address] [phone]       Follow up with Xu,Jindong, MD. Schedule an appointment as soon as possible for a visit in 1 month.   Specialty:  Neurology   Contact information:   [address] [place] [address] [phone]       Follow up with Glori Bickers, MD. Schedule an appointment as soon as possible for a visit in 1 week.   Specialty:  Cardiology   Why:  CHF   Contact information:   [place]  [place] [address] [phone]         Discharge Instructions  and  Discharge Medications      Discharge Instructions    Discharge instructions    Complete by:  As directed   Follow with Primary MD Mauricio Po, FNP in 7 days   Get CBC, CMP, 2 view Chest X ray checked  by Primary MD next visit.   Monitor INR closely  Activity: As tolerated with Full fall precautions use walker/cane & assistance as needed   Disposition CIR   Diet:  Dysphagia 3 with nectar thick liquids with feeding assistance and aspiration precautions.  For Heart failure patients - Check your Weight same time everyday, if you gain over 2 pounds, or you develop in leg swelling, experience more shortness of breath or chest pain, call your Primary MD immediately. Follow Cardiac Low Salt Diet and 1.5 lit/day fluid restriction.   On your next visit with your primary care physician please Get Medicines reviewed and adjusted.   Please request your Prim.MD to go over all Hospital Tests and Procedure/Radiological results at the follow up, please get all Hospital records sent to your Prim MD by signing hospital release before you go home.   If you experience worsening of your admission symptoms, develop shortness of breath, life threatening emergency, suicidal or homicidal thoughts you must seek medical attention immediately by calling 911 or calling your MD immediately  if symptoms less  severe.  You Must read complete instructions/literature along with all the possible adverse reactions/side effects for all the Medicines you take and that have been prescribed to you. Take any new Medicines after you have completely understood and accpet all the possible adverse reactions/side effects.   Do not drive, operating heavy machinery, perform activities at heights, swimming or participation in water activities or provide baby sitting services if your were admitted for syncope or siezures until you have seen by Primary MD or a Neurologist and advised to do so again.  Do not drive when taking Pain medications.    Do not take more than prescribed Pain, Sleep and Anxiety Medications  Special Instructions: If you have smoked or chewed Tobacco  in the last 2 yrs please stop smoking, stop any regular Alcohol  and or any Recreational drug use.  Wear Seat belts while driving.   Please note  You were cared for by a hospitalist during your hospital stay. If you have any questions about your discharge medications or the care you received while you were in the hospital after you are discharged, you can call the unit and asked to speak with the hospitalist on call if the hospitalist that took care of you is not available. Once you are discharged, your primary care physician will handle any further medical issues. Please note that NO REFILLS for any discharge medications will be authorized once you are discharged, as it is imperative that you return to your primary care physician (or establish a relationship with a primary care physician if you do not have one) for your aftercare needs so that they can reassess your need for medications and monitor your lab values.     Increase activity slowly    Complete by:  As directed             Medication List    STOP taking these medications        metFORMIN 500 MG tablet  Commonly known as:  GLUCOPHAGE     warfarin 5 MG tablet  Commonly known as:   COUMADIN      TAKE these medications        acetaminophen 500 MG tablet  Commonly known as:  TYLENOL  Take 1,000 mg by mouth every 6 (six) hours as needed for moderate pain.     aspirin 81 MG EC tablet  Take 1 tablet (81 mg total) by mouth daily. Stop when INR reaches 2     atorvastatin 80 MG tablet  Commonly known as:  LIPITOR  Take 1 tablet (80 mg total) by mouth daily at 6 PM.     carvedilol 3.125 MG tablet  Commonly known as:  COREG  Take 1 tablet (3.125 mg total) by mouth 2 (two) times daily with a meal.     digoxin 0.125 MG tablet  Commonly known as:  LANOXIN  Take 1 tablet (0.125 mg total) by mouth daily.     furosemide 40 MG tablet  Commonly known as:  LASIX  Take 1 tablet (40 mg total) by mouth every other day.     glucose blood test strip  Commonly known as:  ONETOUCH VERIO  Use as instructed     insulin aspart 100 UNIT/ML injection  Commonly known as:  NOVOLOG  - Before each meal 3 times a day, 140-199 - 2 units, 200-250 - 4 units, 251-299 - 6 units,  300-349 - 8 units,  350 or above 10 units.  - Dispense syringes and needles as needed, Ok to switch to [employer] if approved.     Insulin Glargine 100 UNIT/ML Solostar Pen  Commonly known as:  LANTUS  Inject 25 Units into the skin daily at 10 pm.     QC PEN NEEDLES 31G X 6 MM Misc  Generic drug:  Insulin Pen Needle  1 each by Other route See admin instructions. Use daily with insulin.     traMADol 50 MG tablet  Commonly known as:  ULTRAM  Take 1 tablet (50 mg total) by mouth every 12 (twelve) hours as needed for moderate pain.          Diet and Activity recommendation: See Discharge Instructions above   Consults obtained - Neuro   Major procedures and Radiology Reports - PLEASE review detailed and final reports for all details, in brief -      TTE - pending, to be followed by CIR MD      Ct Angio Head W/cm &/or Wo Cm  01/25/2015   CLINICAL DATA:  54 year old male stroke patient with left side  weakness and difficulty swallowing. Not a candidate for thrombolysis. Cannot have MRI. Initial encounter.  EXAM: CT ANGIOGRAPHY HEAD AND NECK  TECHNIQUE: Multidetector CT imaging of the head and neck was performed using the standard protocol during bolus administration of intravenous contrast. Multiplanar CT image reconstructions and MIPs were obtained to evaluate the vascular anatomy. Carotid stenosis measurements (when applicable) are obtained utilizing NASCET criteria, using the distal internal carotid diameter as the denominator.  CONTRAST:  60mL OMNIPAQUE IOHEXOL 350 MG/ML SOLN  COMPARISON:  Head CT without contrast 01/24/2015.  FINDINGS: CT HEAD  Brain: Evolved right MCA anterior division infarct with progressive gray and white matter hypodensity corresponding to the insula and anterior operculum, in the area questioned by CT yesterday. No acute intracranial hemorrhage identified. No associated mass effect. No ventriculomegaly. Stable gray-white matter differentiation elsewhere.  Calvarium and skull base: Negative.  Paranasal sinuses: Visualized paranasal sinuses and mastoids are clear.  Orbits: Negative.  CTA NECK  Other neck: Left  chest cardiac pacemaker with mild streak artifact. Paraseptal and centrilobular emphysema. No superior mediastinal lymphadenopathy.  Thyroid, larynx, pharynx, parapharyngeal spaces, retropharyngeal space and sublingual space are within normal limits.  Diffuse increased attenuation of subcutaneous fat and superficial soft tissues such that there is portal in a shin of the submandibular and parotid glands, as well as the muscle layers. No cervical lymphadenopathy identified.  Skeleton: Degenerative changes in the cervical spine. No acute osseous abnormality identified.  Aortic arch: 3 vessel arch configuration. Mild distal arch calcified plaque. No great vessel origin stenosis.  Right carotid system: Negative right CCA. Widely patent right carotid bifurcation with minimal calcified  plaque (series 504, image 99). Negative cervical right ICA.  Left carotid system: Negative left CCA. Negative left carotid bifurcation. Negative cervical left ICA.  Vertebral arteries:  No proximal right subclavian artery stenosis. Paravertebral venous contrast reflux obscures the proximal right vertebral artery and its origin. The right V2 segment is normal. Negative right vertebral artery at the skullbase.  No proximal left subclavian artery stenosis. Non dominant appearing left vertebral artery has a normal origin, and is normal to the skullbase.  CTA HEAD  Posterior circulation: Mildly dominant distal right vertebral artery. Normal PICA origins. Normal vertebrobasilar junction. No basilar artery stenosis. Normal SCA and left PCA origins. Fetal type right PCA origin. Left posterior communicating artery is diminutive or absent. Bilateral PCA branches are within normal limits.  Anterior circulation: Negative left ICA siphon. Normal left ophthalmic artery origin. Negative right ICA siphon. Normal right ophthalmic and posterior communicating artery origins. Normal carotid termini. Normal MCA and ACA origins. Diminutive or absent anterior communicating artery. Bilateral ACA branches are within normal limits. Left MCA branches are within normal limits.  Right MCA origin is normal and M1 segment is patent. Right MCA bifurcation is patent. There is then filling defect and irregularity of proximal right MCA M2 branches, with subtotal loss of enhancement of the proximal anterior sylvian division best seen on series 506, image 16. Despite this, there is preserved more distal right M2 and M3 enhancement. No major right MCA branch occlusion is identified.  Venous sinuses: Negative.  Anatomic variants: Mildly dominant right vertebral artery. Fetal type right PCA origin.  Delayed phase: No abnormal enhancement identified.  IMPRESSION: 1. Evolving anterior division right MCA infarct. No hemorrhage or mass effect. 2. Associated  filling defect just beyond the right MCA bifurcation affecting the proximal right M2 branches compatible with thromboembolic disease. Despite this, no major right MCA branch occlusion is identified. 3. Otherwise negative intracranial CTA. 4. Minimal atherosclerosis in the neck and aortic arch. 5. Emphysema.   Electronically Signed   By: Genevie Ann M.D.   On: 01/25/2015 14:31   Ct Head Wo Contrast  01/24/2015   CLINICAL DATA:  Awoke with left-sided weakness and difficulty swallowing.  EXAM: CT HEAD WITHOUT CONTRAST  TECHNIQUE: Contiguous axial images were obtained from the base of the skull through the vertex without intravenous contrast.  COMPARISON:  None.  FINDINGS: The brainstem and cerebellum are unremarkable. I think there is debatable loss of gray-white differentiation in the insular region on the right. There may be early change in the posterior frontal region. Basal ganglia appear intact. No sign of hemorrhage, hydrocephalus or extra-axial collection. No calvarial abnormality. Visualized sinuses are clear.  IMPRESSION: Question loss of gray-white differentiation in the insula and posterior frontal region on the right that could go along with the earliest CT manifestation of acute infarction in the right middle cerebral artery territory.  No hemorrhage.   Electronically Signed   By: Nelson Chimes M.D.   On: 01/24/2015 16:08   Ct Angio Neck W/cm &/or Wo/cm  01/25/2015   CLINICAL DATA:  54 year old male stroke patient with left side weakness and difficulty swallowing. Not a candidate for thrombolysis. Cannot have MRI. Initial encounter.  EXAM: CT ANGIOGRAPHY HEAD AND NECK  TECHNIQUE: Multidetector CT imaging of the head and neck was performed using the standard protocol during bolus administration of intravenous contrast. Multiplanar CT image reconstructions and MIPs were obtained to evaluate the vascular anatomy. Carotid stenosis measurements (when applicable) are obtained utilizing NASCET criteria, using the  distal internal carotid diameter as the denominator.  CONTRAST:  33mL OMNIPAQUE IOHEXOL 350 MG/ML SOLN  COMPARISON:  Head CT without contrast 01/24/2015.  FINDINGS: CT HEAD  Brain: Evolved right MCA anterior division infarct with progressive gray and white matter hypodensity corresponding to the insula and anterior operculum, in the area questioned by CT yesterday. No acute intracranial hemorrhage identified. No associated mass effect. No ventriculomegaly. Stable gray-white matter differentiation elsewhere.  Calvarium and skull base: Negative.  Paranasal sinuses: Visualized paranasal sinuses and mastoids are clear.  Orbits: Negative.  CTA NECK  Other neck: Left chest cardiac pacemaker with mild streak artifact. Paraseptal and centrilobular emphysema. No superior mediastinal lymphadenopathy.  Thyroid, larynx, pharynx, parapharyngeal spaces, retropharyngeal space and sublingual space are within normal limits.  Diffuse increased attenuation of subcutaneous fat and superficial soft tissues such that there is portal in a shin of the submandibular and parotid glands, as well as the muscle layers. No cervical lymphadenopathy identified.  Skeleton: Degenerative changes in the cervical spine. No acute osseous abnormality identified.  Aortic arch: 3 vessel arch configuration. Mild distal arch calcified plaque. No great vessel origin stenosis.  Right carotid system: Negative right CCA. Widely patent right carotid bifurcation with minimal calcified plaque (series 504, image 99). Negative cervical right ICA.  Left carotid system: Negative left CCA. Negative left carotid bifurcation. Negative cervical left ICA.  Vertebral arteries:  No proximal right subclavian artery stenosis. Paravertebral venous contrast reflux obscures the proximal right vertebral artery and its origin. The right V2 segment is normal. Negative right vertebral artery at the skullbase.  No proximal left subclavian artery stenosis. Non dominant appearing left  vertebral artery has a normal origin, and is normal to the skullbase.  CTA HEAD  Posterior circulation: Mildly dominant distal right vertebral artery. Normal PICA origins. Normal vertebrobasilar junction. No basilar artery stenosis. Normal SCA and left PCA origins. Fetal type right PCA origin. Left posterior communicating artery is diminutive or absent. Bilateral PCA branches are within normal limits.  Anterior circulation: Negative left ICA siphon. Normal left ophthalmic artery origin. Negative right ICA siphon. Normal right ophthalmic and posterior communicating artery origins. Normal carotid termini. Normal MCA and ACA origins. Diminutive or absent anterior communicating artery. Bilateral ACA branches are within normal limits. Left MCA branches are within normal limits.  Right MCA origin is normal and M1 segment is patent. Right MCA bifurcation is patent. There is then filling defect and irregularity of proximal right MCA M2 branches, with subtotal loss of enhancement of the proximal anterior sylvian division best seen on series 506, image 16. Despite this, there is preserved more distal right M2 and M3 enhancement. No major right MCA branch occlusion is identified.  Venous sinuses: Negative.  Anatomic variants: Mildly dominant right vertebral artery. Fetal type right PCA origin.  Delayed phase: No abnormal enhancement identified.  IMPRESSION: 1. Evolving anterior division right  MCA infarct. No hemorrhage or mass effect. 2. Associated filling defect just beyond the right MCA bifurcation affecting the proximal right M2 branches compatible with thromboembolic disease. Despite this, no major right MCA branch occlusion is identified. 3. Otherwise negative intracranial CTA. 4. Minimal atherosclerosis in the neck and aortic arch. 5. Emphysema.   Electronically Signed   By: Genevie Ann M.D.   On: 01/25/2015 14:31   Dg Chest Port 1 View  01/26/2015   CLINICAL DATA:  Shortness of breath, history chronic systolic heart  failure, ischemic cardiomyopathy, type 2 diabetes, hypercholesterolemia, smoker  EXAM: PORTABLE CHEST - 1 VIEW  COMPARISON:  Portable exam 0826 hours compared to 12/03/2014  FINDINGS: LEFT subclavian sequential pacemaker leads project over RIGHT atrium and RIGHT ventricle.  Upper normal heart size.  Atherosclerotic calcification aorta.  Mediastinal contours and pulmonary vascularity normal.  Emphysematous changes without infiltrate, pleural effusion or pneumothorax.  No acute osseous findings.  IMPRESSION: No acute abnormalities.   Electronically Signed   By: Lavonia Dana M.D.   On: 01/26/2015 08:32    Micro Results      No results found for this or any previous visit (from the past 240 hour(s)).     Today   Subjective:   Dennis [name] today has no headache,no chest abdominal pain,no new weakness tingling or numbness, feels much better.  Objective:   Blood pressure 87/61, pulse 108, temperature 98.2 F (36.8 C), temperature source Oral, resp. rate 18, height 6' (1.829 m), weight 67.223 kg (148 lb 3.2 oz), SpO2 98 %.   Intake/Output Summary (Last 24 hours) at 01/26/15 1046 Last data filed at 01/26/15 0700  Gross per 24 hour  Intake   1318 ml  Output   1160 ml  Net    158 ml    Exam Awake Alert, Oriented x 3, No new F.N deficits, Normal affect, left-sided strength 4/5 at best [place].AT,PERRAL Supple Neck,No JVD, No cervical lymphadenopathy appriciated.  Symmetrical Chest wall movement, Good air movement bilaterally, CTAB RRR,No Gallops,Rubs or new Murmurs, No Parasternal Heave +ve B.Sounds, Abd Soft, Non tender, No organomegaly appriciated, No rebound -guarding or rigidity. No Cyanosis, Clubbing or edema, No new Rash or bruise  Data Review   CBC w Diff: Lab Results  Component Value Date   WBC 9.4 01/24/2015   HGB 15.5 01/24/2015   HCT 46.1 01/24/2015   PLT 199 01/24/2015   LYMPHOPCT 23 01/24/2015   MONOPCT 4 01/24/2015   EOSPCT 0 01/24/2015   BASOPCT 0 01/24/2015    CMP:  Lab Results  Component Value Date   NA 135 01/26/2015   K 3.9 01/26/2015   CL 99 01/26/2015   CO2 26 01/26/2015   BUN 15 01/26/2015   CREATININE 1.08 01/26/2015   PROT 6.3 01/24/2015   ALBUMIN 3.3* 01/24/2015   BILITOT 1.0 01/24/2015   ALKPHOS 99 01/24/2015   AST 18 01/24/2015   ALT 18 01/24/2015  . Lab Results  Component Value Date   HGBA1C 13.3* 01/24/2015    Lab Results  Component Value Date   CHOL 197 01/25/2015   HDL 73 01/25/2015   LDLCALC 116* 01/25/2015   TRIG 42 01/25/2015   CHOLHDL 2.7 01/25/2015     Total Time in preparing paper work, data evaluation and todays exam - 35 minutes  Thurnell Lose M.D on 01/26/2015 at 10:46 AM  [facility]   Office  [phone]

## 2015-01-26 NOTE — Evaluation (Signed)
Occupational Therapy Evaluation Patient Details Name: Dennis [name] MRN: [id number] DOB: 1961-09-28 Today's Date: 01/26/2015    History of Present Illness 54 y.o. male admitted with Dense L Hemiparesis - likely right MCA stroke.   Clinical Impression   Pt admitted with above. He demonstrates the below listed deficits and will benefit from continued OT to maximize safety and independence with BADLs.  Pt presents to OT with decreased arousal, impaired cognition, Rt hemiparesis, impaired balance, ? Lt inattention, decreased safety awareness.  Currently, he requires min - mod A for ADLs and min A for functional mobility.  He will benefit from CIR to allow him to maximize independence with ADLs, improve Lt UE function, and decrease risk of falls.       Follow Up Recommendations  CIR;Supervision/Assistance - 24 hour    Equipment Recommendations  None recommended by OT    Recommendations for Other Services Rehab consult     Precautions / Restrictions Precautions Precautions: Fall      Mobility Bed Mobility Overal bed mobility: Needs Assistance Bed Mobility: Supine to Sit     Supine to sit: Mod assist     General bed mobility comments: Pt requires assist to move LEs to EOB and assist to lift shoulder from bed   Transfers Overall transfer level: Needs assistance Equipment used: None Transfers: Sit to/from Stand;Stand Pivot Transfers Sit to Stand: Min assist Stand pivot transfers: Min assist       General transfer comment: Pt requires min A for balance and safety.  he is impulsive     Balance Overall balance assessment: Needs assistance Sitting-balance support: Feet supported Sitting balance-Leahy Scale: Fair     Standing balance support: During functional activity Standing balance-Leahy Scale: Poor Standing balance comment: requires min A                             ADL Overall ADL's : Needs assistance/impaired Eating/Feeding: Supervision/ safety;Set  up;Sitting   Grooming: Wash/dry hands;Wash/dry face;Oral care;Brushing hair;Minimal assistance;Standing   Upper Body Bathing: Moderate assistance;Sitting   Lower Body Bathing: Moderate assistance;Sit to/from stand   Upper Body Dressing : Moderate assistance;Sitting   Lower Body Dressing: Moderate assistance;Sit to/from stand   Toilet Transfer: Minimal assistance;Ambulation;Comfort height toilet;Grab bars   Toileting- Clothing Manipulation and Hygiene: Moderate assistance;Sit to/from stand       Functional mobility during ADLs: Minimal assistance General ADL Comments: Pt is very impulsive.  He is lethargic with difficulty sustaining arousal, but does make significant effort to stay awake.  He is slow to initiate activity      Vision Additional Comments: Pt unable to fully participate due to difficulty maintaining arousal    Perception Perception Perception Tested?: Yes Perception Deficits: Inattention/neglect Inattention/Neglect: Does not attend to left side of body Comments: Mild Lt inattention noted    Praxis Praxis Praxis tested?: Deficits Deficits: Initiation    Pertinent Vitals/Pain Pain Assessment: No/denies pain     Hand Dominance Right   Extremity/Trunk Assessment Upper Extremity Assessment Upper Extremity Assessment: LUE deficits/detail LUE Deficits / Details: Pt able to reach to ~80* shoulder flexion.  Elbow flex/ext grossly 3/5; mass grasp and release  LUE Sensation: decreased light touch LUE Coordination: decreased fine motor;decreased gross motor   Lower Extremity Assessment Lower Extremity Assessment: Defer to PT evaluation   Cervical / Trunk Assessment Cervical / Trunk Assessment: Other exceptions Cervical / Trunk Exceptions: mild weakness Lt trunk    Communication Communication Communication: No difficulties  Cognition Arousal/Alertness: Lethargic Behavior During Therapy: Impulsive;Flat affect Overall Cognitive Status: Difficult to assess                      General Comments       Exercises       Shoulder Instructions      Home Living Family/patient expects to be discharged to:: Inpatient rehab Living Arrangements: Parent Available Help at Discharge: Family;Available 24 hours/day Type of Home: House Home Access: Stairs to enter [employer] of Steps: 3 Entrance Stairs-Rails: Right;Left Home Layout: Multi-level;Able to live on main level with bedroom/bathroom               Home Equipment: Tub bench      Lives With: Family    Prior Functioning/Environment Level of Independence: Independent        Comments: Pt reports he drives and is independent with IADLs     OT Diagnosis: Generalized weakness;Cognitive deficits;Disturbance of vision;Hemiplegia  Non dominant side   OT Problem List: Decreased strength;Decreased range of motion;Decreased activity tolerance;Impaired balance (sitting and/or standing);Impaired vision/perception;Decreased coordination;Decreased cognition;Decreased safety awareness;Decreased knowledge of use of DME or AE;Decreased knowledge of precautions;Impaired sensation;Impaired tone;Impaired UE functional use   OT Treatment/Interventions: Self-care/ADL training;Neuromuscular education;DME and/or AE instruction;Therapeutic activities;Cognitive remediation/compensation;Visual/perceptual remediation/compensation;Patient/family education;Balance training    OT Goals(Current goals can be found in the care plan section) Acute Rehab OT Goals Patient Stated Goal: to get better  OT Goal Formulation: With patient Time For Goal Achievement: 02/09/15 Potential to Achieve Goals: Good ADL Goals Pt Will Perform Grooming: with supervision;standing Pt Will Perform Upper Body Bathing: with set-up;sitting;with supervision Pt Will Perform Lower Body Bathing: with supervision;sit to/from stand Pt Will Perform Upper Body Dressing: with set-up;with supervision;sitting Pt Will Perform  Lower Body Dressing: with supervision;sit to/from stand Pt Will Transfer to Toilet: with supervision;ambulating;regular height toilet;grab bars Pt Will Perform Toileting - Clothing Manipulation and hygiene: with supervision;sit to/from stand Additional ADL Goal #1: Pt will participate in visual assessment  Additional ADL Goal #2: Pt will demonstrate emergent awareness of deficits  Additional ADL Goal #3: Pt will maintain selective attention to ADL tasks in a busy, distracting environment   OT Frequency: Min 2X/week   Barriers to D/C:            Co-evaluation              End of Session Nurse Communication: Mobility status  Activity Tolerance: Patient tolerated treatment well Patient left: in chair;with call bell/phone within reach;with chair alarm set   Time: 8546-2703 OT Time Calculation (min): 16 min Charges:  OT General Charges $OT Visit: 1 Procedure OT Evaluation $Initial OT Evaluation Tier I: 1 Procedure G-Codes:    Conarpe, Wendi M 25-Feb-2015, 10:01 AM

## 2015-01-27 ENCOUNTER — Inpatient Hospital Stay (HOSPITAL_COMMUNITY): Payer: Medicare Other | Admitting: Physical Therapy

## 2015-01-27 ENCOUNTER — Inpatient Hospital Stay (HOSPITAL_COMMUNITY): Payer: Medicare Other | Admitting: Occupational Therapy

## 2015-01-27 ENCOUNTER — Inpatient Hospital Stay (HOSPITAL_COMMUNITY): Payer: Medicare Other | Admitting: Speech Pathology

## 2015-01-27 ENCOUNTER — Inpatient Hospital Stay (HOSPITAL_COMMUNITY): Payer: Medicare Other

## 2015-01-27 DIAGNOSIS — I639 Cerebral infarction, unspecified: Secondary | ICD-10-CM | POA: Insufficient documentation

## 2015-01-27 LAB — GLUCOSE, CAPILLARY
Glucose-Capillary: 306 mg/dL — ABNORMAL HIGH (ref 70–99)
Glucose-Capillary: 112 mg/dL — ABNORMAL HIGH (ref 70–99)
Glucose-Capillary: 116 mg/dL — ABNORMAL HIGH (ref 70–99)
Glucose-Capillary: 215 mg/dL — ABNORMAL HIGH (ref 70–99)

## 2015-01-27 LAB — COMPREHENSIVE METABOLIC PANEL
ALT: 18 U/L (ref 0–53)
AST: 21 U/L (ref 0–37)
Albumin: 2.8 g/dL — ABNORMAL LOW (ref 3.5–5.2)
Alkaline Phosphatase: 85 U/L (ref 39–117)
Anion gap: 11 (ref 5–15)
BUN: 16 mg/dL (ref 6–23)
CO2: 24 mmol/L (ref 19–32)
Calcium: 8.6 mg/dL (ref 8.4–10.5)
Chloride: 99 mmol/L (ref 96–112)
Creatinine, Ser: 1.04 mg/dL (ref 0.50–1.35)
GFR calc Af Amer: >90 mL/min (ref >90)
GFR calc non Af Amer: 81 mL/min — ABNORMAL LOW (ref >90)
Glucose, Bld: 342 mg/dL — ABNORMAL HIGH (ref 70–99)
Potassium: 4.5 mmol/L (ref 3.5–5.1)
Sodium: 134 mmol/L — ABNORMAL LOW (ref 135–145)
Total Bilirubin: 1.1 mg/dL (ref 0.3–1.2)
Total Protein: 5.6 g/dL — ABNORMAL LOW (ref 6.0–8.3)

## 2015-01-27 LAB — CBC WITH DIFFERENTIAL/PLATELET
Basophils Absolute: 0 10*3/uL (ref 0.0–0.1)
Basophils Relative: 0 % (ref 0–1)
Eosinophils Absolute: 0.1 10*3/uL (ref 0.0–0.7)
Eosinophils Relative: 1 % (ref 0–5)
HCT: 42.4 % (ref 39.0–52.0)
Hemoglobin: 13.7 g/dL (ref 13.0–17.0)
Lymphocytes Relative: 28 % (ref 12–46)
Lymphs Abs: 2.9 10*3/uL (ref 0.7–4.0)
MCH: 30.6 pg (ref 26.0–34.0)
MCHC: 32.3 g/dL (ref 30.0–36.0)
MCV: 94.9 fL (ref 78.0–100.0)
Monocytes Absolute: 1 10*3/uL (ref 0.1–1.0)
Monocytes Relative: 9 % (ref 3–12)
Neutro Abs: 6.4 10*3/uL (ref 1.7–7.7)
Neutrophils Relative %: 62 % (ref 43–77)
Platelets: 180 10*3/uL (ref 150–400)
RBC: 4.47 MIL/uL (ref 4.22–5.81)
RDW: 12.3 % (ref 11.5–15.5)
WBC: 10.5 10*3/uL (ref 4.0–10.5)

## 2015-01-27 LAB — PROTIME-INR
INR: 1.65 — ABNORMAL HIGH (ref 0.00–1.49)
Prothrombin Time: 19.6 seconds — ABNORMAL HIGH (ref 11.6–15.2)

## 2015-01-27 MED ORDER — WARFARIN SODIUM 5 MG PO TABS
5.0000 mg | ORAL_TABLET | Freq: Once | ORAL | Status: AC
  Administered 2015-01-27: 5 mg via ORAL
  Filled 2015-01-27: qty 1

## 2015-01-27 MED ORDER — FUROSEMIDE 20 MG PO TABS
20.0000 mg | ORAL_TABLET | ORAL | Status: DC
  Filled 2015-01-27: qty 1

## 2015-01-27 NOTE — Evaluation (Signed)
Speech Language Pathology Assessment and Plan  Patient Details  Name: Dennis [name] MRN: [id number] Date of Birth: Dec 04, 1960  SLP Diagnosis: Cognitive Impairments;Dysphagia  Rehab Potential:  Excellent  ELOS: 10-14 days     Today's Date: 01/27/2015 SLP Individual Time: 3244-0102 SLP Individual Time Calculation (min): 60 min   Problem List:  Patient Active Problem List   Diagnosis Date Noted  . CVA (cerebral infarction) 01/26/2015  . Left-sided weakness   . Embolic stroke involving right middle cerebral artery   . Cardiac LV ejection fraction 10-20%   . LV non-compaction cardiomyopathy   . Acute systolic heart failure 72/53/6644  . Acute ischemic stroke 01/24/2015  . Cocaine abuse 01/24/2015  . H/O noncompliance with medical treatment, presenting hazards to health 01/24/2015  . Tobacco abuse 01/24/2015  . Essential hypertension 01/24/2015  . Hypotension 01/24/2015  . Hyperlipidemia 01/24/2015  . Dysphagia following cerebral infarction 01/24/2015  . Hemiplegia affecting left nondominant side 01/24/2015  . Diabetes mellitus type 2, uncontrolled, with complications 03/47/4259  . Hyperlipidemia associated with type 2 diabetes mellitus 11/30/2014  . CAD (coronary artery disease) 11/16/2014  . Acute on chronic systolic CHF (congestive heart failure) 09/21/2014  . Chest pain on breathing 09/21/2014  . Chronic systolic heart failure 56/38/7564  . Acute on chronic systolic heart failure   . Automatic implantable cardioverter-defibrillator in situ   . Left ventricular noncompaction 05/27/2014  . Dyspnea 05/26/2014  . Chest discomfort 08/09/2013  . Long term (current) use of anticoagulants 08/09/2013  . Drug abuse and dependence   . CHF (congestive heart failure)   . BP (high blood pressure) 03/04/2013  . Cardiomyopathy, ischemic 07/21/2011  . Type II diabetes mellitus with ophthalmic manifestations 09/13/2007  . TOBACCO ABUSE 09/13/2007  . WEIGHT LOSS, ABNORMAL 09/13/2007  . SOB  09/13/2007   Past Medical History:  Past Medical History  Diagnosis Date  . Chronic systolic heart failure     a) Mixed ICM/NICM b) RHC (05/2014): RA 2, RV 19/2/3, PA 22/14 (18), PCWP 6, Fick CO/CI: 5.2 / 2.7, PVR 2.3 WU, PA 60% and 64% c) ECHO (05/2014): EF 20-25%, diff HK, akinesis entireanteroseptal myocardium, triv AI, mod MR, LA mod/sev dilated  . Automatic implantable cardioverter-defibrillator in situ   . Heart murmur   . Sleep apnea     "cleared after T&A"  . Left ventricular noncompaction   . Ischemic cardiomyopathy     a) Coronary angiography (12/2008) at [facility]: Lmain: nl, LAD mid 100% stenosis with left to left and right-to-left collaterals to the distal LAD; Lcx: nl, RCA nl.    . Type II diabetes mellitus   . High cholesterol   . Pneumonia 1988  . Drug abuse and dependence   . CHF (congestive heart failure)    Past Surgical History:  Past Surgical History  Procedure Laterality Date  . Forearm fracture surgery Left 1980  . Cardiac defibrillator placement  03/2011  . Cholecystectomy  1994  . Right heart catheterization N/A 05/30/2014    Procedure: RIGHT HEART CATH;  Surgeon: Jolaine Artist, MD;  Location: [facility] CATH LAB;  Service: Cardiovascular;  Laterality: N/A;  . Fracture surgery    . Cardiac catheterization  05/2014  . Tonsillectomy and adenoidectomy  ~ 1998    Assessment / Plan / Recommendation Clinical Impression Dennis [name] is a 54 y.o. male with history of chronic systolic HF, OSA, DM type 2, Left ventricular non-compaction on chronic coumadin, s/p ICD,  medication non-compliance; who was found down by family with slurred  speech, left facial droop and left sided weakness.  Blood sugar- 536 and UDS positive for cocaine. CT head with early signs of R-MCA infarct.   Swallow evaluation done revealing mild oropharyngeal dysphagia and patient placed on Dys. 3 textures with nectar-thick liquids. CTA head/neck done revealing evolving anterior division right MCA infarct.  Dr.  Erlinda Hong recommended resuming Coumadin for embolic stroke due to low EF. He has had complaints of SOB and was treated with dose of IV lasix. CXR with NAD.  Patient with resultant left hemiparesis, left inattention, fluctuating bouts of lethargy as well as poor insight/awareness.  CIR recommended by MD and Rehab team and patient admitted 01/26/15. Patient was administered a cognitive-linguistic evaluation and demonstrates mild cognitive impairments characterized by decreased sustained attention, working memory, functional problem solving, left inattention, intellectual awareness of deficits, safety awareness and insight which impacts his ability to complete functional and familiar tasks safely, suspect lethargy impacted his overall function as well. MBS administered earlier today and patient upgraded to regular textures with thin liquids. Patient observed with thin liquids via straw and ice cream without overt s/s of aspiration, patient declined trials of regular. Recommend patient continue current diet with intermittent supervision. Patient would benefit from skilled SLP intervention to maximize his cognitive and swallowing function in order to maximize his overall functional independence prior to discharge.   Skilled Therapeutic Interventions          Administered a cognitive-linguistic evaluation. Please see above for details. Observed patient with thin liquids via spoon and straw without overt s/s of aspiration. Recommend patient continue current diet.   SLP Assessment  Patient will need skilled Speech Lanaguage Pathology Services during CIR admission    Recommendations  Diet Recommendations: Regular Liquid Administration via: Cup;Straw Medication Administration: Whole meds with liquid Supervision: Intermittent supervision to cue for compensatory strategies Compensations: Slow rate;Small sips/bites Postural Changes and/or Swallow Maneuvers: Seated upright 90 degrees Oral Care Recommendations: Oral care  BID Patient destination: Home Follow up Recommendations: 24 hour supervision/assistance (TBD ) Equipment Recommended: None recommended by SLP    SLP Frequency 3 to 5 out of 7 days   SLP Treatment/Interventions Cognitive remediation/compensation;Cueing hierarchy;Dysphagia/aspiration precaution training;Environmental controls;Functional tasks;Internal/external aids;Patient/family education;Therapeutic Activities    Pain   Prior Functioning    Short Term Goals: Week 1: SLP Short Term Goal 1 (Week 1): Patient will consume current diet without overt s/s of aspiration with Mod I for use of swallow strategies.  SLP Short Term Goal 2 (Week 1): Patient will demonstrate complex problem solving for functional and familiar tasks with supervision verbal cues.  SLP Short Term Goal 3 (Week 1): Patient will recall new, daily information with use  of external aids with supervision verbal cues.  SLP Short Term Goal 4 (Week 1): Patient will identify 2 physical and 2 cognitive deficits with supervision question cues.  SLP Short Term Goal 5 (Week 1): Patient will utilize call bell to request assistance with supervision verbal cues.  SLP Short Term Goal 6 (Week 1): Patient will demonstrate selective attention to tasks for 30 minutes with supervision cues for redirection.   See FIM for current functional status Refer to Care Plan for Long Term Goals  Recommendations for other services: None  Discharge Criteria: Patient will be discharged from SLP if patient refuses treatment 3 consecutive times without medical reason, if treatment goals not met, if there is a change in medical status, if patient makes no progress towards goals or if patient is discharged from hospital.  The above  assessment, treatment plan, treatment alternatives and goals were discussed and mutually agreed upon: by patient  PAYNE, COURTNEY 01/27/2015, 3:53 PM

## 2015-01-27 NOTE — Evaluation (Signed)
Occupational Therapy Assessment and Plan  Patient Details  Name: Dennis [name] MRN: [id number] Date of Birth: 29-Dec-1960  OT Diagnosis: abnormal posture, ataxia, hemiplegia affecting non-dominant side and muscle weakness (generalized) Rehab Potential: Rehab Potential (ACUTE ONLY): Good ELOS: 12-14 days   Today's Date: 01/27/2015 OT Individual Time:  -   0930-1030   (60 min)       Problem List:  Patient Active Problem List   Diagnosis Date Noted  . CVA (cerebral infarction) 01/26/2015  . Left-sided weakness   . Embolic stroke involving right middle cerebral artery   . Cardiac LV ejection fraction 10-20%   . LV non-compaction cardiomyopathy   . Acute systolic heart failure 96/78/9381  . Acute ischemic stroke 01/24/2015  . Cocaine abuse 01/24/2015  . H/O noncompliance with medical treatment, presenting hazards to health 01/24/2015  . Tobacco abuse 01/24/2015  . Essential hypertension 01/24/2015  . Hypotension 01/24/2015  . Hyperlipidemia 01/24/2015  . Dysphagia following cerebral infarction 01/24/2015  . Hemiplegia affecting left nondominant side 01/24/2015  . Diabetes mellitus type 2, uncontrolled, with complications 01/75/1025  . Hyperlipidemia associated with type 2 diabetes mellitus 11/30/2014  . CAD (coronary artery disease) 11/16/2014  . Acute on chronic systolic CHF (congestive heart failure) 09/21/2014  . Chest pain on breathing 09/21/2014  . Chronic systolic heart failure 85/27/7824  . Acute on chronic systolic heart failure   . Automatic implantable cardioverter-defibrillator in situ   . Left ventricular noncompaction 05/27/2014  . Dyspnea 05/26/2014  . Chest discomfort 08/09/2013  . Long term (current) use of anticoagulants 08/09/2013  . Drug abuse and dependence   . CHF (congestive heart failure)   . BP (high blood pressure) 03/04/2013  . Cardiomyopathy, ischemic 07/21/2011  . Type II diabetes mellitus with ophthalmic manifestations 09/13/2007  . TOBACCO ABUSE  09/13/2007  . WEIGHT LOSS, ABNORMAL 09/13/2007  . SOB 09/13/2007    Past Medical History:  Past Medical History  Diagnosis Date  . Chronic systolic heart failure     a) Mixed ICM/NICM b) RHC (05/2014): RA 2, RV 19/2/3, PA 22/14 (18), PCWP 6, Fick CO/CI: 5.2 / 2.7, PVR 2.3 WU, PA 60% and 64% c) ECHO (05/2014): EF 20-25%, diff HK, akinesis entireanteroseptal myocardium, triv AI, mod MR, LA mod/sev dilated  . Automatic implantable cardioverter-defibrillator in situ   . Heart murmur   . Sleep apnea     "cleared after T&A"  . Left ventricular noncompaction   . Ischemic cardiomyopathy     a) Coronary angiography (12/2008) at [facility]: Lmain: nl, LAD mid 100% stenosis with left to left and right-to-left collaterals to the distal LAD; Lcx: nl, RCA nl.    . Type II diabetes mellitus   . High cholesterol   . Pneumonia 1988  . Drug abuse and dependence   . CHF (congestive heart failure)    Past Surgical History:  Past Surgical History  Procedure Laterality Date  . Forearm fracture surgery Left 1980  . Cardiac defibrillator placement  03/2011  . Cholecystectomy  1994  . Right heart catheterization N/A 05/30/2014    Procedure: RIGHT HEART CATH;  Surgeon: Jolaine Artist, MD;  Location: [facility] CATH LAB;  Service: Cardiovascular;  Laterality: N/A;  . Fracture surgery    . Cardiac catheterization  05/2014  . Tonsillectomy and adenoidectomy  ~ 1998    Assessment & Plan Clinical Impression:  HPI: Dennis [name] is a 54 y.o. male with history of chronic systolic HF, OSA, DM type 2, Left ventricular non-compaction on chronic coumadin,  s/p ICD, medication non-compliance; who was found down by family with slurred speech, left facial droop and left sided weakness. Blood sugar- 536 and UDS positive for cocaine. CT head with early signs of R-MCA infarct. Swallow evaluation done revealing mild oropharyngeal dysphagia and patient placed on DIII, nectar liquids. 2D echo done today. Cognitive evaluation done  revealing impulsivity as well as subtle short term memory deficits. CTA head/neck done revealing evolving anterior division right MCA infarct. Dr. Erlinda Hong recommended resuming Coumadin for embolic stroke due to low EF. He has had complaints of SOB and was treated with dose of IV lasix. CXR with NAD. Patient with resultant Left hemiparesis, left inattention, fluctuating bouts of lethargy as well as poor insight/awareness.    Patient currently requires mod with basic self-care skills secondary to muscle weakness and muscle paralysis, decreased cardiorespiratoy endurance and abnormal tone, ataxia and decreased coordination.  Prior to hospitalization, patient could complete BADL with independent .  Patient will benefit from skilled intervention to increase independence with basic self-care skills prior to discharge home with care partner.  Anticipate patient will require intermittent supervision and follow up home health.  OT - End of Session Activity Tolerance: Tolerates 30+ min activity with multiple rests Endurance Deficit: Yes Endurance Deficit Description: cardiorespiratory OT Assessment Rehab Potential (ACUTE ONLY): Good Barriers to Discharge: Decreased caregiver support OT Plan OT Intensity: Minimum of 1-2 x/day, 45 to 90 minutes OT Frequency: 5 out of 7 days OT Duration/Estimated Length of Stay: 12-14 days OT Treatment/Interventions: Medical illustrator training;Community reintegration;Discharge planning;DME/adaptive equipment instruction;Functional mobility training;Neuromuscular re-education;Pain management;Patient/family education;Psychosocial support;Self Care/advanced ADL retraining;Therapeutic Activities;Therapeutic Exercise;UE/LE Strength taining/ROM;UE/LE Coordination activities;Wheelchair propulsion/positioning OT Recommendation Recommendations for Other Services:  (none) Patient destination: Home Follow Up Recommendations: Home health OT Equipment Recommended: To be  determined   Skilled Therapeutic Intervention OT skilled intervention with focus on POC, toilet and shower transfers,LUE NMRE.  Pt's SO, Pat in session for 50 % of it.  Pt. Ambulated to toilet with min HHA.  See Fim.  Pt utilized LuE at gross assist.  Pt. Completed all balance in standing and sitting at SBA.     Left pt in wc with  call bell,phone within reach.     OT Evaluation Precautions/Restrictions :  Fall risk   Pain  none   Home Living/Prior Functioning Home Living Available Help at Discharge: Family, Available 24 hours/day (Pat) Type of Home: House Home Access: Stairs to enter [employer] of Steps: 1 step into back entry Entrance Stairs-Rails: None Home Layout: Multi-level, 1/2 bath on main level, Bed/bath upstairs Alternate Level Stairs-Number of Steps: 12 (first 5 steps rail on R and no wall L; next 7 steps rail on L and wall on R) Alternate Level Stairs-Rails: Right, Left Additional Comments: pt's mom is retired but in poor physical health (massive heart attack a few years ago); brother has a strained relationship and can help some but works and is a single parent; girlfriend [name] will be available  Lives With:  (mom is 33 and pt lives with her) IADL History Homemaking Responsibilities: Yes Meal Prep Responsibility: Secondary Laundry Responsibility: Secondary Cleaning Responsibility: Primary Bill Paying/Finance Responsibility: No Shopping Responsibility: Secondary Current License: Yes Mode of Transportation: Car Occupation: Part time employment (cleaning buildings) Leisure and Hobbies:  (spend time with daughter) Prior Function Level of Independence: Independent with gait, Independent with transfers  Able to Take Stairs?: Yes Driving: Yes Vocation: On disability Comments: Pt reports he drives and is independent with IADLs  ADL   Vision/Perception  Vision- Assessment Eye Alignment: Within Functional Limits Perception Perception:  Impaired Inattention/Neglect: Impaired-to be further tested in functional context  Cognition Overall Cognitive Status: Impaired/Different from baseline Arousal/Alertness: Lethargic Orientation Level: Oriented X4 Attention: Focused;Sustained Focused Attention: Appears intact Sustained Attention: Impaired Sustained Attention Impairment: Verbal basic;Functional basic Memory: Appears intact Memory Impairment: Retrieval deficit Awareness: Impaired Awareness Impairment: Emergent impairment Problem Solving: Appears intact Problem Solving Impairment: Verbal basic;Functional basic Behaviors: Impulsive Safety/Judgment: Impaired Sensation Sensation Light Touch: Appears Intact Proprioception: Appears Intact Coordination Gross Motor Movements are Fluid and Coordinated: No Fine Motor Movements are Fluid and Coordinated: No Coordination and Movement Description: dysmetria near end point with L finger to nose while eyes closed Heel Shin Test: slower on L side with less control on descent compared to R side Motor  Motor Motor: Hemiplegia;Ataxia Mobility  Bed Mobility Bed Mobility: Rolling Right;Right Sidelying to Sit Rolling Right: 5: Supervision Rolling Right Details: Verbal cues for precautions/safety Right Sidelying to Sit: 5: Supervision Right Sidelying to Sit Details: Verbal cues for technique Transfers Sit to Stand: 4: Min assist;From bed Sit to Stand Details: Manual facilitation for placement Stand to Sit: 4: Min assist Stand to Sit Details (indicate cue type and reason): Verbal cues for precautions/safety;Verbal cues for technique  Trunk/Postural Assessment  Cervical Assessment Cervical Assessment: Within Functional Limits Thoracic Assessment Thoracic Assessment: Within Functional Limits Lumbar Assessment Lumbar Assessment: Within Functional Limits Postural Control Postural Control: Deficits on evaluation Postural Limitations: pt continues to lean forward while sitting up in  w/c - at first stating he feels fine, then stating he doesn't feel himself, and so he was transferred back to bed  Balance Balance Balance Assessed: Yes Static Sitting Balance Static Sitting - Balance Support: Bilateral upper extremity supported;Feet supported Static Sitting - Level of Assistance: 5: Stand by assistance Dynamic Sitting Balance Dynamic Sitting - Balance Support: Bilateral upper extremity supported;Feet supported;During functional activity Dynamic Sitting - Level of Assistance: 4: Min assist Static Standing Balance Static Standing - Balance Support: During functional activity;No upper extremity supported Static Standing - Level of Assistance: 4: Min assist Dynamic Standing Balance Dynamic Standing - Balance Support: No upper extremity supported;During functional activity Dynamic Standing - Level of Assistance: 4: Min assist Extremity/Trunk Assessment RUE Assessment RUE Assessment: Within Functional Limits LUE Assessment LUE Assessment: Exceptions to WFL LUE AROM (degrees) Overall AROM Left Upper Extremity: Deficits (brunnstrom stage 4) LUE Strength LUE Overall Strength: Deficits LUE Tone LUE Tone: Hypotonic Hypotonic Details: grosly in L UE  FIM:  FIM - Eating Eating Activity: 5: Supervision/cues FIM - Grooming Grooming Steps: Wash, rinse, dry face;Wash, rinse, dry hands Grooming: 4: Patient completes 3 of 4 or 4 of 5 steps FIM - Bathing Bathing Steps Patient Completed: Chest;Left Arm;Abdomen;Front perineal area;Buttocks;Right upper leg;Left upper leg;Right lower leg (including foot) Bathing: 4: Min-Patient completes 8-9 8f10 parts or 75+ percent FIM - Upper Body Dressing/Undressing Upper body dressing/undressing steps patient completed: Thread/unthread right sleeve of pullover shirt/dresss;Thread/unthread left sleeve of pullover shirt/dress Upper body dressing/undressing: 3: Mod-Patient completed 50-74% of tasks FIM - Lower Body Dressing/Undressing Lower  body dressing/undressing steps patient completed: Thread/unthread right underwear leg;Pull underwear up/down;Thread/unthread right pants leg;Pull pants up/down Lower body dressing/undressing: 3: Mod-Patient completed 50-74% of tasks FIM - Toileting Toileting steps completed by patient: Performs perineal hygiene Toileting Assistive Devices: Grab bar or rail for support Toileting: 2: Max-Patient completed 1 of 3 steps FIM - TRadio producerDevices: Grab bars Toilet Transfers: 4-From toilet/BSC: Min A (steadying Pt. > 75%);4-To toilet/BSC: Min  A (steadying Pt. > 75%) FIM - Tub/Shower Transfers Tub/Shower Assistive Devices: Tub transfer bench;Grab bars;Walk in shower Tub/shower Transfers: 4-Into Tub/Shower: Min A (steadying Pt. > 75%/lift 1 leg);4-Out of Tub/Shower: Min A (steadying Pt. > 75%/lift 1 leg)   Refer to Care Plan for Long Term Goals  Recommendations for other services: None  Discharge Criteria: Patient will be discharged from OT if patient refuses treatment 3 consecutive times without medical reason, if treatment goals not met, if there is a change in medical status, if patient makes no progress towards goals or if patient is discharged from hospital.  The above assessment, treatment plan, treatment alternatives and goals were discussed and mutually agreed upon: by patient and by family  Lisa Roca 01/27/2015, 7:57 PM

## 2015-01-27 NOTE — Procedures (Signed)
Objective Swallowing Evaluation: Modified Barium Swallowing Study  Patient Details  Name: Dennis [name] MRN: [id number] Date of Birth: 1961/07/16  Today's Date: 01/27/2015 Time:  -     Past Medical History:  Past Medical History  Diagnosis Date  . Chronic systolic heart failure     a) Mixed ICM/NICM b) RHC (05/2014): RA 2, RV 19/2/3, PA 22/14 (18), PCWP 6, Fick CO/CI: 5.2 / 2.7, PVR 2.3 WU, PA 60% and 64% c) ECHO (05/2014): EF 20-25%, diff HK, akinesis entireanteroseptal myocardium, triv AI, mod MR, LA mod/sev dilated  . Automatic implantable cardioverter-defibrillator in situ   . Heart murmur   . Sleep apnea     "cleared after T&A"  . Left ventricular noncompaction   . Ischemic cardiomyopathy     a) Coronary angiography (12/2008) at [facility]: Lmain: nl, LAD mid 100% stenosis with left to left and right-to-left collaterals to the distal LAD; Lcx: nl, RCA nl.    . Type II diabetes mellitus   . High cholesterol   . Pneumonia 1988  . Drug abuse and dependence   . CHF (congestive heart failure)    Past Surgical History:  Past Surgical History  Procedure Laterality Date  . Forearm fracture surgery Left 1980  . Cardiac defibrillator placement  03/2011  . Cholecystectomy  1994  . Right heart catheterization N/A 05/30/2014    Procedure: RIGHT HEART CATH;  Surgeon: Jolaine Artist, MD;  Location: [facility] CATH LAB;  Service: Cardiovascular;  Laterality: N/A;  . Fracture surgery    . Cardiac catheterization  05/2014  . Tonsillectomy and adenoidectomy  ~ 1998   HPI:  Dennis [name] is a 54 y.o. male with history of chronic systolic HF, OSA, DM type 2, Left ventricular non-compaction on chronic coumadin, s/p ICD, medication non-compliance; who was found down by family with slurred speech, left facial droop and left sided weakness. Blood sugar- 536 and UDS positive for cocaine. CT head with early signs of R-MCA infarct. Swallow evaluation done revealing mild oropharyngeal dysphagia and patient placed  on DIII, nectar liquids. 2D echo done today. Cognitive evaluation done revealing impulsivity as well as subtle short term memory deficits. CTA head/neck done revealing evolving anterior division right MCA infarct. Dr. Erlinda Hong recommended resuming Coumadin for embolic stroke due to low EF. He has had complaints of SOB and was treated with dose of IV lasix. CXR with NAD. Patient with resultant Left hemiparesis, left inattention, fluctuating bouts of lethargy as well as poor insight/awareness     Recommendation/Prognosis  Clinical Impression:   Dysphagia Diagnosis: Mild oral phase dysphagia Clinical impression: Pt presents with mild neuorgenic oral dysphagia. Impairments characterized by decreased bolus control and premature spillage of thin liquids to level of pyriform sinuses with larger cup sips and use of straw. However adequate airway protection evidenced throughout study with no penetration or aspiration with all consistencies trialed. Mild vallecular residue noted that was cleared with additional swallow uncued by clinician. Recommend regluar thin diet; meds whole with thin, straws allowed. Intermittent superivision recommended and set up assistance with meals secondary to residual left upper extremity hemiparesis.    Swallow Evaluation Recommendations:  Diet Recommendations: Regular Liquid Administration via: Cup;Straw Medication Administration: Whole meds with liquid Supervision: Intermittent supervision to cue for compensatory strategies Compensations: Slow rate;Small sips/bites Postural Changes and/or Swallow Maneuvers: Seated upright 90 degrees Oral Care Recommendations: Oral care BID Follow up Recommendations: None    Prognosis:  Prognosis for Safe Diet Advancement: Good   Individuals Consulted: Consulted and Agree  with Results and Recommendations: Patient      SLP Assessment/Plan  Plan:         General: Date of Onset: 01/26/15 Type of Study: Modified Barium Swallowing  Study Reason for Referral: Objectively evaluate swallowing function Diet Prior to this Study: Dysphagia 3 (soft);Nectar-thick liquids Respiratory Status: Room air Behavior/Cognition: Alert Oral Cavity - Dentition: Adequate natural dentition Self-Feeding Abilities: Needs set up;Able to feed self Patient Positioning: Upright in chair Baseline Vocal Quality: Clear Volitional Cough: Weak Volitional Swallow: Able to elicit Anatomy: Within functional limits   Reason for Referral:   Objectively evaluate swallowing function    Oral Phase: Oral Preparation/Oral Phase Oral Phase: Impaired Oral - Thin Oral - Thin Teaspoon: Weak lingual manipulation;Lingual/palatal residue Oral - Thin Cup: Weak lingual manipulation Oral - Thin Straw: Weak lingual manipulation Oral - Solids Oral - Puree: Delayed oral transit;Weak lingual manipulation Oral - Regular: Delayed oral transit;Weak lingual manipulation;Lingual/palatal residue Oral - Pill: Weak lingual manipulation Oral Phase - Comment Oral Phase - Comment: mild oral phase impairment   Pharyngeal Phase:  Pharyngeal Phase Pharyngeal Phase: Impaired Pharyngeal - Thin Pharyngeal - Thin Teaspoon: Within functional limits Pharyngeal - Thin Cup: Within functional limits Pharyngeal - Thin Straw: Premature spillage to pyriform sinuses Pharyngeal - Solids Pharyngeal - Puree: Pharyngeal residue - valleculae Pharyngeal - Regular: Pharyngeal residue - valleculae Pharyngeal - Pill: Premature spillage to valleculae   Cervical Esophageal Phase      GN         Arvil Chaco MA, CCC-SLP Acute Care Speech Language Pathologist    Levi Aland 01/27/2015, 2:01 PM

## 2015-01-27 NOTE — Evaluation (Signed)
Physical Therapy Assessment and Plan  Patient Details  Name: Case Vassell MRN: [id number] Date of Birth: 1961/02/23  PT Diagnosis: Ataxia, Cognitive deficits, Coordination disorder, Difficulty walking, Dizziness and giddiness, Hemiplegia non-dominant, Hypotonia, Impaired cognition and Muscle weakness Rehab Potential: Good ELOS: 10-14 days   Today's Date: 01/27/2015 PT Individual Time: 0800-0900 PT Individual Time Calculation (min): 60 min    Problem List:  Patient Active Problem List   Diagnosis Date Noted  . CVA (cerebral infarction) 01/26/2015  . Left-sided weakness   . Embolic stroke involving right middle cerebral artery   . Cardiac LV ejection fraction 10-20%   . LV non-compaction cardiomyopathy   . Acute systolic heart failure 02/58/5277  . Acute ischemic stroke 01/24/2015  . Cocaine abuse 01/24/2015  . H/O noncompliance with medical treatment, presenting hazards to health 01/24/2015  . Tobacco abuse 01/24/2015  . Essential hypertension 01/24/2015  . Hypotension 01/24/2015  . Hyperlipidemia 01/24/2015  . Dysphagia following cerebral infarction 01/24/2015  . Hemiplegia affecting left nondominant side 01/24/2015  . Diabetes mellitus type 2, uncontrolled, with complications 82/42/3536  . Hyperlipidemia associated with type 2 diabetes mellitus 11/30/2014  . CAD (coronary artery disease) 11/16/2014  . Acute on chronic systolic CHF (congestive heart failure) 09/21/2014  . Chest pain on breathing 09/21/2014  . Chronic systolic heart failure 14/43/1540  . Acute on chronic systolic heart failure   . Automatic implantable cardioverter-defibrillator in situ   . Left ventricular noncompaction 05/27/2014  . Dyspnea 05/26/2014  . Chest discomfort 08/09/2013  . Long term (current) use of anticoagulants 08/09/2013  . Drug abuse and dependence   . CHF (congestive heart failure)   . BP (high blood pressure) 03/04/2013  . Cardiomyopathy, ischemic 07/21/2011  . Type II diabetes mellitus  with ophthalmic manifestations 09/13/2007  . TOBACCO ABUSE 09/13/2007  . WEIGHT LOSS, ABNORMAL 09/13/2007  . SOB 09/13/2007    Past Medical History:  Past Medical History  Diagnosis Date  . Chronic systolic heart failure     a) Mixed ICM/NICM b) RHC (05/2014): RA 2, RV 19/2/3, PA 22/14 (18), PCWP 6, Fick CO/CI: 5.2 / 2.7, PVR 2.3 WU, PA 60% and 64% c) ECHO (05/2014): EF 20-25%, diff HK, akinesis entireanteroseptal myocardium, triv AI, mod MR, LA mod/sev dilated  . Automatic implantable cardioverter-defibrillator in situ   . Heart murmur   . Sleep apnea     "cleared after T&A"  . Left ventricular noncompaction   . Ischemic cardiomyopathy     a) Coronary angiography (12/2008) at [facility]: Lmain: nl, LAD mid 100% stenosis with left to left and right-to-left collaterals to the distal LAD; Lcx: nl, RCA nl.    . Type II diabetes mellitus   . High cholesterol   . Pneumonia 1988  . Drug abuse and dependence   . CHF (congestive heart failure)    Past Surgical History:  Past Surgical History  Procedure Laterality Date  . Forearm fracture surgery Left 1980  . Cardiac defibrillator placement  03/2011  . Cholecystectomy  1994  . Right heart catheterization N/A 05/30/2014    Procedure: RIGHT HEART CATH;  Surgeon: Jolaine Artist, MD;  Location: [facility] CATH LAB;  Service: Cardiovascular;  Laterality: N/A;  . Fracture surgery    . Cardiac catheterization  05/2014  . Tonsillectomy and adenoidectomy  ~ 1998    Assessment & Plan Clinical Impression:  Brysen Shankman is a 54 y.o. 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 Blood sugar- 536 and UDS positive for cocaine. CT head with early signs of R-MCA infarct. Swallow evaluation done revealing mild oropharyngeal dysphagia and patient placed on DIII, nectar  liquids. 2D echo done today. Cognitive evaluation done revealing impulsivity as well as subtle short term memory deficits. CTA head/neck done revealing evolving anterior division right MCA infarct. Dr. Erlinda Hong recommended resuming Coumadin for embolic stroke due to low EF. He has had complaints of SOB and was treated with dose of IV lasix. CXR with NAD. Patient with resultant Left hemiparesis, left inattention, fluctuating bouts of lethargy as well as poor insight/awareness. Patient transferred to CIR on 01/26/2015 .   Patient currently requires min with mobility secondary to muscle weakness, decreased cardiorespiratoy endurance, abnormal tone, ataxia and decreased coordination, decreased attention to left, decreased attention, decreased awareness, decreased problem solving and decreased safety awareness and decreased sitting balance, decreased standing balance, decreased postural control, hemiplegia and decreased balance strategies.  Prior to hospitalization, patient was independent  with mobility and lived with Family in a House home.  Home access is 1 step into back entryStairs to enter.  Patient will benefit from skilled PT intervention to maximize safe functional mobility, minimize fall risk and decrease caregiver burden for planned discharge home with 24 hour supervision.  Anticipate patient will benefit from follow up [place] at discharge.  PT - End of Session Activity Tolerance: Tolerates < 10 min activity with changes in vital signs Endurance Deficit: Yes Endurance Deficit Description: cardiorespiratory PT Assessment Rehab Potential (ACUTE/IP ONLY): Good Barriers to Discharge: Inaccessible home environment;Decreased caregiver support PT Patient demonstrates impairments in the following area(s): Balance;Behavior;Endurance;Motor;Perception;Safety PT Transfers Functional Problem(s): Bed Mobility;Bed to Chair;Car;Furniture PT Locomotion Functional Problem(s): Ambulation;Wheelchair  Mobility;Stairs PT Plan PT Intensity: Minimum of 1-2 x/day ,45 to 90 minutes PT Frequency: 5 out of 7 days PT Duration Estimated Length of Stay: 10-14 days PT Treatment/Interventions: Ambulation/gait training;Disease management/prevention;Pain management;Stair training;Visual/perceptual remediation/compensation;Balance/vestibular training;DME/adaptive equipment instruction;Patient/family education;Therapeutic Activities;Wheelchair propulsion/positioning;Cognitive remediation/compensation;Psychosocial support;Therapeutic Exercise;Functional electrical stimulation;Community reintegration;Functional mobility training;Skin care/wound management;UE/LE Strength taining/ROM;Discharge planning;Neuromuscular re-education;UE/LE Coordination activities;Splinting/orthotics PT Transfers Anticipated Outcome(s): supervision PT Locomotion Anticipated Outcome(s): supervision gait PT Recommendation Follow Up Recommendations: Home health PT Patient destination: Home Equipment Recommended: To be determined  Skilled Therapeutic Intervention PT Evaluation: PT evaluation initiated, but limited by pt's low blood pressure - RN notified who contacted MD, who encouraged pt to drink more fluids. PT asked RN if pt could have TED hose or an abdominal binder to assist with BP management and RN said MD only wanted pt to drink more fluids. Pt presents with mild L side hemiplegia & inattention, but moderate difficulty with coordination and ataxia noted. PT unable to complete gait, w/c management, or stairs assessment on day of eval, as pt begins to lean forward and report feeling badly after bed to w/c transfer, so PT immediately transferred him back to bed, suspecting a drop in blood pressure with upright sitting from pt's already low blood pressure measured in supine.   Therapeutic Activity: PT instructs pt in bed mobility rolling R in bed and R side lie to sit transfer req SBA. PT instructs pt in donning bathrobe in sit/stand  req assist with threading each arm through, and min A sit to stand, and min A stand step transfer bed to/from w/c, and min A sit to supine. Pt reports he is thirsty, so PT elevated HOB maximally and placed 2 pillows behind  his back, then thickened water to nectar consistency and pt fed himself spoonfuls of nectar water with full supervision from PT (1/5 of a styrofoam cup). Pt ended with HOB slightly elevated (per pt comfort), bed alarm on, and all needs in reach.   Continue with functional mobility assessment as vitals are stable and as is safe.    PT Evaluation Precautions/Restrictions Precautions Precautions: Fall Precaution Comments: L side hemiplegia & neglect Restrictions Weight Bearing Restrictions: No General Chart Reviewed: Yes Family/Caregiver Present: Yes (girlfriend) Vital SignsTherapy Vitals Temp: 98.6 F (37 C) Temp Source: Oral Pulse Rate: 93 Resp: 17 BP: (!) 84/58 mmHg (L arm) Patient Position (if appropriate): Lying Oxygen Therapy SpO2: 98 % O2 Device: Not Delivered Pain Pain Assessment Pain Assessment: No/denies pain Home Living/Prior Functioning Home Living Available Help at Discharge: Family;Available 24 hours/day Type of Home: House Home Access: Stairs to enter [employer] of Steps: 1 step into back entry Entrance Stairs-Rails: None Home Layout: Multi-level;1/2 bath on main level;Bed/bath upstairs Alternate Level Stairs-Number of Steps: 12 (first 5 steps rail on R and no wall L; next 7 steps rail on L and wall on R) Alternate Level Stairs-Rails: Right;Left Additional Comments: pt's mom is retired but in poor physical health (massive heart attack a few years ago); brother has a strained relationship and can help some but works and is a single parent; girlfriend [name] will be available  Lives With: Family Prior Function Level of Independence: Independent with gait;Independent with transfers  Able to Take Stairs?: Yes Driving: Yes Vocation: On  disability Comments: Pt reports he drives and is independent with IADLs  Vision/Perception  Perception Comments: Mild L side inattention noted in bed (L lean)  Cognition Overall Cognitive Status: Impaired/Different from baseline Arousal/Alertness: Lethargic Orientation Level: Oriented X4 Attention: Focused;Sustained Focused Attention: Appears intact Sustained Attention: Impaired Awareness: Impaired Awareness Impairment: Emergent impairment Behaviors: Impulsive Safety/Judgment: Impaired Sensation Sensation Light Touch: Appears Intact Stereognosis: Not tested Hot/Cold: Not tested Proprioception: Appears Intact Coordination Gross Motor Movements are Fluid and Coordinated: No Fine Motor Movements are Fluid and Coordinated: Not tested Coordination and Movement Description: dysmetria near end point with L finger to nose while eyes closed Heel Shin Test: slower on L side with less control on descent compared to R side Motor  Motor Motor: Ataxia;Hemiplegia;Abnormal tone  Mobility Bed Mobility Bed Mobility: Rolling Right;Right Sidelying to Sit Rolling Right: 5: Supervision Rolling Right Details: Verbal cues for precautions/safety Right Sidelying to Sit: 5: Supervision Right Sidelying to Sit Details: Verbal cues for technique Transfers Transfers: Yes Sit to Stand: 4: Min assist;From bed Sit to Stand Details: Manual facilitation for placement Stand to Sit: 4: Min guard;With upper extremity assist Stand Pivot Transfers: 4: Min assist Stand Pivot Transfer Details: Manual facilitation for placement Locomotion  Ambulation Ambulation: No (pt verbalizes symptoms consistent with blood pressure drop after transfer, and so assisted back to bed) Gait Gait: No Stairs / Additional Locomotion Stairs: No Wheelchair Mobility Wheelchair Mobility: No  Trunk/Postural Assessment  Cervical Assessment Cervical Assessment: Within Functional Limits Thoracic Assessment Thoracic Assessment:  Within Functional Limits Lumbar Assessment Lumbar Assessment: Within Functional Limits Postural Control Postural Control: Deficits on evaluation Postural Limitations: pt continues to lean forward while sitting up in w/c - at first stating he feels fine, then stating he doesn't feel himself, and so he was transferred back to bed  Balance Balance Balance Assessed: Yes Static Sitting Balance Static Sitting - Balance Support: Bilateral upper extremity supported;Feet supported Static Sitting - Level of Assistance: 5: Stand  by assistance Dynamic Sitting Balance Dynamic Sitting - Balance Support: Bilateral upper extremity supported;Feet supported;During functional activity Dynamic Sitting - Level of Assistance: 4: Min assist Static Standing Balance Static Standing - Balance Support: During functional activity;No upper extremity supported Static Standing - Level of Assistance: 4: Min assist Dynamic Standing Balance Dynamic Standing - Balance Support: No upper extremity supported;During functional activity Dynamic Standing - Level of Assistance: 4: Min assist Extremity Assessment  RUE Assessment RUE Assessment: Within Functional Limits LUE Assessment LUE Assessment: Exceptions to WFL LUE AROM (degrees) Overall AROM Left Upper Extremity: Within functional limits for tasks assessed LUE Strength LUE Overall Strength: Deficits LUE Overall Strength Comments: arm flexion 3/5, elbow grossly 4/5, grip grossly 4/5 LUE Tone LUE Tone: Hypotonic Hypotonic Details: grosly in L UE RLE Assessment RLE Assessment: Within Functional Limits LLE Assessment LLE Assessment: Exceptions to WFL LLE AROM (degrees) Overall AROM Left Lower Extremity: Within functional limits for tasks assessed LLE Strength LLE Overall Strength: Deficits LLE Overall Strength Comments: hip flexion 3+/5, knee grossly 4+/5, ankle DF 4+/5 LLE Tone LLE Tone Comments: atatxic  FIM:      Refer to Care Plan for Long Term  Goals  Recommendations for other services: None  Discharge Criteria: Patient will be discharged from PT if patient refuses treatment 3 consecutive times without medical reason, if treatment goals not met, if there is a change in medical status, if patient makes no progress towards goals or if patient is discharged from hospital.  The above assessment, treatment plan, treatment alternatives and goals were discussed and mutually agreed upon: by patient and by family  [facility] M 01/27/2015, 9:01 AM

## 2015-01-27 NOTE — Progress Notes (Signed)
ANTICOAGULATION CONSULT NOTE - Follow Up Consult  Pharmacy Consult for Coumadin Indication: stroke and non-compaction cardiomyopathy  No Known Allergies  Patient Measurements: Height: 6' (182.9 cm) Weight: 142 lb 10.2 oz (64.7 kg) IBW/kg (Calculated) : 77.6  Vital Signs: Temp: 98.6 F (37 C) (04/16 0559) Temp Source: Oral (04/16 0559) BP: 84/58 mmHg (04/16 0827) Pulse Rate: 93 (04/16 0827)  Labs:  Recent Labs  01/24/15 1359 01/25/15 0535 01/26/15 0628 01/27/15 0647 01/27/15 0817  HGB 15.5  --   --  13.7  --   HCT 46.1  --   --  42.4  --   PLT 199  --   --  180  --   LABPROT  --   --  14.3  --  19.6*  INR  --   --  1.10  --  1.65*  CREATININE  --  0.88 1.08 1.04  --     Estimated Creatinine Clearance: 76 mL/min (by C-G formula based on Cr of 1.04).   Medications:  Coumadin 10mg  given 4/14 and 4/15  Assessment: 54 year old male on long-term Coumadin for non-compaction cardiomyopathy admitted 4/13 with new stroke.  He was noncompliant with Coumadin prior to admission.  His INR has increased significantly today after only 2 doses of Coumadin.  Goal of Therapy:  INR 2-3   Plan:  Decrease Coumadin to 5mg  today Daily PT/INR  Legrand Como, Pharm.D., BCPS, AAHIVP Clinical Pharmacist Phone: [phone] or [phone] 01/27/2015, 1:59 PM

## 2015-01-27 NOTE — Progress Notes (Signed)
[facility]     PROGRESS NOTE    Subjective/Complaints: Doesn't appear to have had any issues last night. Could have slept better. Doesn't offer much to me today. Pt denies nausea, vomiting, abdominal pain, diarrhea, chest pain, shortness of breath, palpitations, dizziness   Objective: Vital Signs: Blood pressure 95/74, pulse 105, temperature 98.6 F (37 C), temperature source Oral, resp. rate 17, height 6' (1.829 m), weight 64.7 kg (142 lb 10.2 oz), SpO2 93 %. Ct Angio Head W/cm &/or Wo Cm  01/25/2015   CLINICAL DATA:  54 year old male stroke patient with left side weakness and difficulty swallowing. Not a candidate for thrombolysis. Cannot have MRI. Initial encounter.  EXAM: CT ANGIOGRAPHY HEAD AND NECK  TECHNIQUE: Multidetector CT imaging of the head and neck was performed using the standard protocol during bolus administration of intravenous contrast. Multiplanar CT image reconstructions and MIPs were obtained to evaluate the vascular anatomy. Carotid stenosis measurements (when applicable) are obtained utilizing NASCET criteria, using the distal internal carotid diameter as the denominator.  CONTRAST:  38mL OMNIPAQUE IOHEXOL 350 MG/ML SOLN  COMPARISON:  Head CT without contrast 01/24/2015.  FINDINGS: CT HEAD  Brain: Evolved right MCA anterior division infarct with progressive gray and white matter hypodensity corresponding to the insula and anterior operculum, in the area questioned by CT yesterday. No acute intracranial hemorrhage identified. No associated mass effect. No ventriculomegaly. Stable gray-white matter differentiation elsewhere.  Calvarium and skull base: Negative.  Paranasal sinuses: Visualized paranasal sinuses and mastoids are clear.  Orbits: Negative.  CTA NECK  Other neck: Left chest cardiac pacemaker with mild streak artifact. Paraseptal and centrilobular emphysema. No superior mediastinal lymphadenopathy.  Thyroid, larynx, pharynx,  parapharyngeal spaces, retropharyngeal space and sublingual space are within normal limits.  Diffuse increased attenuation of subcutaneous fat and superficial soft tissues such that there is portal in a shin of the submandibular and parotid glands, as well as the muscle layers. No cervical lymphadenopathy identified.  Skeleton: Degenerative changes in the cervical spine. No acute osseous abnormality identified.  Aortic arch: 3 vessel arch configuration. Mild distal arch calcified plaque. No great vessel origin stenosis.  Right carotid system: Negative right CCA. Widely patent right carotid bifurcation with minimal calcified plaque (series 504, image 99). Negative cervical right ICA.  Left carotid system: Negative left CCA. Negative left carotid bifurcation. Negative cervical left ICA.  Vertebral arteries:  No proximal right subclavian artery stenosis. Paravertebral venous contrast reflux obscures the proximal right vertebral artery and its origin. The right V2 segment is normal. Negative right vertebral artery at the skullbase.  No proximal left subclavian artery stenosis. Non dominant appearing left vertebral artery has a normal origin, and is normal to the skullbase.  CTA HEAD  Posterior circulation: Mildly dominant distal right vertebral artery. Normal PICA origins. Normal vertebrobasilar junction. No basilar artery stenosis. Normal SCA and left PCA origins. Fetal type right PCA origin. Left posterior communicating artery is diminutive or absent. Bilateral PCA branches are within normal limits.  Anterior circulation: Negative left ICA siphon. Normal left ophthalmic artery origin. Negative right ICA siphon. Normal right ophthalmic and posterior communicating artery origins. Normal carotid termini. Normal MCA and ACA origins. Diminutive or absent anterior communicating artery. Bilateral ACA branches are within normal limits. Left MCA branches are within normal limits.  Right MCA origin is normal and M1 segment is  patent. Right MCA bifurcation is patent. There is then filling defect and irregularity of proximal right MCA M2 branches, with subtotal loss of  enhancement of the proximal anterior sylvian division best seen on series 506, image 16. Despite this, there is preserved more distal right M2 and M3 enhancement. No major right MCA branch occlusion is identified.  Venous sinuses: Negative.  Anatomic variants: Mildly dominant right vertebral artery. Fetal type right PCA origin.  Delayed phase: No abnormal enhancement identified.  IMPRESSION: 1. Evolving anterior division right MCA infarct. No hemorrhage or mass effect. 2. Associated filling defect just beyond the right MCA bifurcation affecting the proximal right M2 branches compatible with thromboembolic disease. Despite this, no major right MCA branch occlusion is identified. 3. Otherwise negative intracranial CTA. 4. Minimal atherosclerosis in the neck and aortic arch. 5. Emphysema.   Electronically Signed   By: Genevie Ann M.D.   On: 01/25/2015 14:31   Ct Angio Neck W/cm &/or Wo/cm  01/25/2015   CLINICAL DATA:  54 year old male stroke patient with left side weakness and difficulty swallowing. Not a candidate for thrombolysis. Cannot have MRI. Initial encounter.  EXAM: CT ANGIOGRAPHY HEAD AND NECK  TECHNIQUE: Multidetector CT imaging of the head and neck was performed using the standard protocol during bolus administration of intravenous contrast. Multiplanar CT image reconstructions and MIPs were obtained to evaluate the vascular anatomy. Carotid stenosis measurements (when applicable) are obtained utilizing NASCET criteria, using the distal internal carotid diameter as the denominator.  CONTRAST:  56mL OMNIPAQUE IOHEXOL 350 MG/ML SOLN  COMPARISON:  Head CT without contrast 01/24/2015.  FINDINGS: CT HEAD  Brain: Evolved right MCA anterior division infarct with progressive gray and white matter hypodensity corresponding to the insula and anterior operculum, in the area  questioned by CT yesterday. No acute intracranial hemorrhage identified. No associated mass effect. No ventriculomegaly. Stable gray-white matter differentiation elsewhere.  Calvarium and skull base: Negative.  Paranasal sinuses: Visualized paranasal sinuses and mastoids are clear.  Orbits: Negative.  CTA NECK  Other neck: Left chest cardiac pacemaker with mild streak artifact. Paraseptal and centrilobular emphysema. No superior mediastinal lymphadenopathy.  Thyroid, larynx, pharynx, parapharyngeal spaces, retropharyngeal space and sublingual space are within normal limits.  Diffuse increased attenuation of subcutaneous fat and superficial soft tissues such that there is portal in a shin of the submandibular and parotid glands, as well as the muscle layers. No cervical lymphadenopathy identified.  Skeleton: Degenerative changes in the cervical spine. No acute osseous abnormality identified.  Aortic arch: 3 vessel arch configuration. Mild distal arch calcified plaque. No great vessel origin stenosis.  Right carotid system: Negative right CCA. Widely patent right carotid bifurcation with minimal calcified plaque (series 504, image 99). Negative cervical right ICA.  Left carotid system: Negative left CCA. Negative left carotid bifurcation. Negative cervical left ICA.  Vertebral arteries:  No proximal right subclavian artery stenosis. Paravertebral venous contrast reflux obscures the proximal right vertebral artery and its origin. The right V2 segment is normal. Negative right vertebral artery at the skullbase.  No proximal left subclavian artery stenosis. Non dominant appearing left vertebral artery has a normal origin, and is normal to the skullbase.  CTA HEAD  Posterior circulation: Mildly dominant distal right vertebral artery. Normal PICA origins. Normal vertebrobasilar junction. No basilar artery stenosis. Normal SCA and left PCA origins. Fetal type right PCA origin. Left posterior communicating artery is  diminutive or absent. Bilateral PCA branches are within normal limits.  Anterior circulation: Negative left ICA siphon. Normal left ophthalmic artery origin. Negative right ICA siphon. Normal right ophthalmic and posterior communicating artery origins. Normal carotid termini. Normal MCA and ACA origins. Diminutive or  absent anterior communicating artery. Bilateral ACA branches are within normal limits. Left MCA branches are within normal limits.  Right MCA origin is normal and M1 segment is patent. Right MCA bifurcation is patent. There is then filling defect and irregularity of proximal right MCA M2 branches, with subtotal loss of enhancement of the proximal anterior sylvian division best seen on series 506, image 16. Despite this, there is preserved more distal right M2 and M3 enhancement. No major right MCA branch occlusion is identified.  Venous sinuses: Negative.  Anatomic variants: Mildly dominant right vertebral artery. Fetal type right PCA origin.  Delayed phase: No abnormal enhancement identified.  IMPRESSION: 1. Evolving anterior division right MCA infarct. No hemorrhage or mass effect. 2. Associated filling defect just beyond the right MCA bifurcation affecting the proximal right M2 branches compatible with thromboembolic disease. Despite this, no major right MCA branch occlusion is identified. 3. Otherwise negative intracranial CTA. 4. Minimal atherosclerosis in the neck and aortic arch. 5. Emphysema.   Electronically Signed   By: Genevie Ann M.D.   On: 01/25/2015 14:31   Dg Chest Port 1 View  01/26/2015   CLINICAL DATA:  Shortness of breath, history chronic systolic heart failure, ischemic cardiomyopathy, type 2 diabetes, hypercholesterolemia, smoker  EXAM: PORTABLE CHEST - 1 VIEW  COMPARISON:  Portable exam 0826 hours compared to 12/03/2014  FINDINGS: LEFT subclavian sequential pacemaker leads project over RIGHT atrium and RIGHT ventricle.  Upper normal heart size.  Atherosclerotic calcification aorta.   Mediastinal contours and pulmonary vascularity normal.  Emphysematous changes without infiltrate, pleural effusion or pneumothorax.  No acute osseous findings.  IMPRESSION: No acute abnormalities.   Electronically Signed   By: Lavonia Dana M.D.   On: 01/26/2015 08:32    Recent Labs  01/24/15 1359 01/27/15 0647  WBC 9.4 10.5  HGB 15.5 13.7  HCT 46.1 42.4  PLT 199 180    Recent Labs  01/26/15 0628 01/27/15 0647  NA 135 134*  K 3.9 4.5  CL 99 99  GLUCOSE 249* 342*  BUN 15 16  CREATININE 1.08 1.04  CALCIUM 9.0 8.6   CBG (last 3)   Recent Labs  01/26/15 1628 01/26/15 2101 01/27/15 0649  GLUCAP 94 303* 306*    Wt Readings from Last 3 Encounters:  01/27/15 64.7 kg (142 lb 10.2 oz)  01/26/15 67.223 kg (148 lb 3.2 oz)  12/01/14 71.396 kg (157 lb 6.4 oz)    Physical Exam:  Constitutional: He appears well-developed and well-nourished. No distress.  HENT: oral mucosa pink/dentition poor Head: Normocephalic and atraumatic.  Right Ear: External ear normal.  Left Ear: External ear normal.  Eyes: Conjunctivae and EOM are normal. Pupils are equal, round, and reactive to light.  Neck: No tracheal deviation present. No thyromegaly present.  Cardiovascular: Normal rate and regular rhythm. Exam reveals no gallop and no friction rub.  No murmur heard. Respiratory: No respiratory distress. He has no wheezes. He has no rales.  GI: Soft. Bowel sounds are normal. He exhibits no distension. There is no tenderness.  Neurological: left facial droop Motor strength is 5/5 in the deltoid biceps triceps grip hip flexor and knee extensor ankle dorsiflexor Left upper extremity 4- to 4/5 in the deltoid, biceps, triceps, grip, hip flexor, knee extensor, ankle dorsiflexor and plantar flexor---inconsistent effort Sensation intact to light touch in bilateral upper and lower limbs\ mild left pronator drift He exhibits mild left neglect Psych: very flat    Assessment/Plan: 1. Functional  deficits secondary to right MCA  infarct which require 3+ hours per day of interdisciplinary therapy in a comprehensive inpatient rehab setting. Physiatrist is providing close team supervision and 24 hour management of active medical problems listed below. Physiatrist and rehab team continue to assess barriers to discharge/monitor patient progress toward functional and medical goals. FIM:                   Comprehension Comprehension Mode: Auditory Comprehension: 5-Understands complex 90% of the time/Cues < 10% of the time  Expression Expression Mode: Verbal Expression: 5-Expresses complex 90% of the time/cues < 10% of the time  Social Interaction Social Interaction: 4-Interacts appropriately 75 - 89% of the time - Needs redirection for appropriate language or to initiate interaction.  Problem Solving Problem Solving: 5-Solves basic 90% of the time/requires cueing < 10% of the time  Memory Memory: 5-Recognizes or recalls 90% of the time/requires cueing < 10% of the time  Medical Problem List and Plan: 1. Functional deficits secondary to embolic right MCA infarct 2. DVT Prophylaxis/Anticoagulation: Pharmaceutical: Coumadin 3. Pain Management: Tylenol prn 4. Mood: Appears to have anxiety with occasional hypoventilation. LCSW to follow for evaluation and support.  5. Neuropsych: This patient is intermittently capable of making decisions on his own behalf. Encourage compliance with all medications.  6. Skin/Wound Care: Routine pressure relief measures.  7. Fluids/Electrolytes/Nutrition: Monitor I/O. bmet essentially normal today. Encourage nectar liquids.  8. Chronic Systolic CHF: Low salt diet. Monitor weights daily. Lasix every other day.  9. DM type 2: Monitor BS with achs checks. LOS (Days) 1 A FACE TO FACE EVALUATION WAS PERFORMED  SWARTZ,ZACHARY T 01/27/2015 7:53 AM

## 2015-01-27 NOTE — Progress Notes (Signed)
B/P low  90/62/ 85/56 .MD notified.

## 2015-01-28 ENCOUNTER — Inpatient Hospital Stay (HOSPITAL_COMMUNITY): Payer: Medicare Other | Admitting: Occupational Therapy

## 2015-01-28 LAB — GLUCOSE, CAPILLARY
Glucose-Capillary: 277 mg/dL — ABNORMAL HIGH (ref 70–99)
Glucose-Capillary: 171 mg/dL — ABNORMAL HIGH (ref 70–99)
Glucose-Capillary: 146 mg/dL — ABNORMAL HIGH (ref 70–99)
Glucose-Capillary: 205 mg/dL — ABNORMAL HIGH (ref 70–99)

## 2015-01-28 LAB — PROTIME-INR
INR: 1.65 — ABNORMAL HIGH (ref 0.00–1.49)
Prothrombin Time: 19.7 seconds — ABNORMAL HIGH (ref 11.6–15.2)

## 2015-01-28 LAB — URINE CULTURE: Colony Count: 4000

## 2015-01-28 MED ORDER — INSULIN GLARGINE 100 UNIT/ML ~~LOC~~ SOLN
20.0000 [IU] | Freq: Every day | SUBCUTANEOUS | Status: DC
  Administered 2015-01-28 – 2015-01-30 (×3): 20 [IU] via SUBCUTANEOUS
  Filled 2015-01-28 (×3): qty 0.2

## 2015-01-28 MED ORDER — WARFARIN SODIUM 10 MG PO TABS
10.0000 mg | ORAL_TABLET | Freq: Once | ORAL | Status: AC
  Administered 2015-01-28: 10 mg via ORAL
  Filled 2015-01-28: qty 1

## 2015-01-28 NOTE — Progress Notes (Signed)
[place] PHYSICAL MEDICINE & REHABILITATION     PROGRESS NOTE    Subjective/Complaints: More alert. Had a good day yesterday. Denies any new issues this morning Pt denies nausea, vomiting, abdominal pain, diarrhea, chest pain, shortness of breath, palpitations, dizziness   Objective: Vital Signs: Blood pressure 86/52, pulse 97, temperature 98 F (36.7 C), temperature source Oral, resp. rate 17, height 6' (1.829 m), weight 64.4 kg (141 lb 15.6 oz), SpO2 100 %. Dg Chest Port 1 View  01/26/2015   CLINICAL DATA:  Shortness of breath, history chronic systolic heart failure, ischemic cardiomyopathy, type 2 diabetes, hypercholesterolemia, smoker  EXAM: PORTABLE CHEST - 1 VIEW  COMPARISON:  Portable exam 0826 hours compared to 12/03/2014  FINDINGS: LEFT subclavian sequential pacemaker leads project over RIGHT atrium and RIGHT ventricle.  Upper normal heart size.  Atherosclerotic calcification aorta.  Mediastinal contours and pulmonary vascularity normal.  Emphysematous changes without infiltrate, pleural effusion or pneumothorax.  No acute osseous findings.  IMPRESSION: No acute abnormalities.   Electronically Signed   By: Lavonia Dana M.D.   On: 01/26/2015 08:32   Dg Swallowing Func-speech Pathology  01/27/2015   Levi Aland, CCC-SLP     01/27/2015  2:03 PM   Objective Swallowing Evaluation: Modified Barium Swallowing Study   Patient Details  Name: Dennis [name] MRN: [id number] Date of Birth: 17-Aug-1961  Today's Date: 01/27/2015 Time:  -     Past Medical History:  Past Medical History  Diagnosis Date  . Chronic systolic heart failure     a) Mixed ICM/NICM b) RHC (05/2014): RA 2, RV 19/2/3, PA 22/14  (18), PCWP 6, Fick CO/CI: 5.2 / 2.7, PVR 2.3 WU, PA 60% and 64%  c) ECHO (05/2014): EF 20-25%, diff HK, akinesis entireanteroseptal  myocardium, triv AI, mod MR, LA mod/sev dilated  . Automatic implantable cardioverter-defibrillator in situ   . Heart murmur   . Sleep apnea     "cleared after T&A"  . Left  ventricular noncompaction   . Ischemic cardiomyopathy     a) Coronary angiography (12/2008) at [facility]: Lmain: nl, LAD mid  100% stenosis with left to left and right-to-left collaterals to  the distal LAD; Lcx: nl, RCA nl.    . Type II diabetes mellitus   . High cholesterol   . Pneumonia 1988  . Drug abuse and dependence   . CHF (congestive heart failure)    Past Surgical History:  Past Surgical History  Procedure Laterality Date  . Forearm fracture surgery Left 1980  . Cardiac defibrillator placement  03/2011  . Cholecystectomy  1994  . Right heart catheterization N/A 05/30/2014    Procedure: RIGHT HEART CATH;  Surgeon: Jolaine Artist, MD;   Location: [facility] CATH LAB;  Service: Cardiovascular;  Laterality:  N/A;  . Fracture surgery    . Cardiac catheterization  05/2014  . Tonsillectomy and adenoidectomy  ~ 1998   HPI:  Dennis [name] is a 54 y.o. male with history of chronic systolic  HF, OSA, DM type 2, Left ventricular non-compaction on chronic  coumadin, s/p ICD, medication non-compliance; who was found down  by family with slurred speech, left facial droop and left sided  weakness. Blood sugar- 536 and UDS positive for cocaine. CT head  with early signs of R-MCA infarct. Swallow evaluation done  revealing mild oropharyngeal dysphagia and patient placed on  DIII, nectar liquids. 2D echo done today. Cognitive evaluation  done revealing impulsivity as well as subtle short term memory  deficits. CTA head/neck  done revealing evolving anterior  division right MCA infarct. Dr. Erlinda Hong recommended resuming  Coumadin for embolic stroke due to low EF. He has had complaints  of SOB and was treated with dose of IV lasix. CXR with NAD.  Patient with resultant Left hemiparesis, left inattention,  fluctuating bouts of lethargy as well as poor insight/awareness     Recommendation/Prognosis  Clinical Impression:   Dysphagia Diagnosis: Mild oral phase dysphagia Clinical impression: Pt presents with mild neuorgenic oral  dysphagia.  Impairments characterized by decreased bolus control  and premature spillage of thin liquids to level of pyriform  sinuses with larger cup sips and use of straw. However adequate  airway protection evidenced throughout study with no penetration  or aspiration with all consistencies trialed. Mild vallecular  residue noted that was cleared with additional swallow uncued by  clinician. Recommend regluar thin diet; meds whole with thin,  straws allowed. Intermittent superivision recommended and set up  assistance with meals secondary to residual left upper extremity  hemiparesis.    Swallow Evaluation Recommendations:  Diet Recommendations: Regular Liquid Administration via: Cup;Straw Medication Administration: Whole meds with liquid Supervision: Intermittent supervision to cue for compensatory  strategies Compensations: Slow rate;Small sips/bites Postural Changes and/or Swallow Maneuvers: Seated upright 90  degrees Oral Care Recommendations: Oral care BID Follow up Recommendations: None    Prognosis:  Prognosis for Safe Diet Advancement: Good   Individuals Consulted: Consulted and Agree with Results and  Recommendations: Patient      SLP Assessment/Plan  Plan:         General: Date of Onset: 01/26/15 Type of Study: Modified Barium Swallowing Study Reason for Referral: Objectively evaluate swallowing function Diet Prior to this Study: Dysphagia 3 (soft);Nectar-thick liquids Respiratory Status: Room air Behavior/Cognition: Alert Oral Cavity - Dentition: Adequate natural dentition Self-Feeding Abilities: Needs set up;Able to feed self Patient Positioning: Upright in chair Baseline Vocal Quality: Clear Volitional Cough: Weak Volitional Swallow: Able to elicit Anatomy: Within functional limits   Reason for Referral:   Objectively evaluate swallowing function    Oral Phase: Oral Preparation/Oral Phase Oral Phase: Impaired Oral - Thin Oral - Thin Teaspoon: Weak lingual manipulation;Lingual/palatal  residue Oral - Thin Cup:  Weak lingual manipulation Oral - Thin Straw: Weak lingual manipulation Oral - Solids Oral - Puree: Delayed oral transit;Weak lingual manipulation Oral - Regular: Delayed oral transit;Weak lingual  manipulation;Lingual/palatal residue Oral - Pill: Weak lingual manipulation Oral Phase - Comment Oral Phase - Comment: mild oral phase impairment   Pharyngeal Phase:  Pharyngeal Phase Pharyngeal Phase: Impaired Pharyngeal - Thin Pharyngeal - Thin Teaspoon: Within functional limits Pharyngeal - Thin Cup: Within functional limits Pharyngeal - Thin Straw: Premature spillage to pyriform sinuses Pharyngeal - Solids Pharyngeal - Puree: Pharyngeal residue - valleculae Pharyngeal - Regular: Pharyngeal residue - valleculae Pharyngeal - Pill: Premature spillage to valleculae   Cervical Esophageal Phase      GN         Arvil Chaco MA, CCC-SLP Acute Care Speech Language Pathologist    Levi Aland 01/27/2015, 2:01 PM                     Recent Labs  01/27/15 0647  WBC 10.5  HGB 13.7  HCT 42.4  PLT 180    Recent Labs  01/26/15 0628 01/27/15 0647  NA 135 134*  K 3.9 4.5  CL 99 99  GLUCOSE 249* 342*  BUN 15 16  CREATININE 1.08 1.04  CALCIUM 9.0 8.6  CBG (last 3)   Recent Labs  01/27/15 1623 01/27/15 2122 01/28/15 0603  GLUCAP 116* 215* 277*    Wt Readings from Last 3 Encounters:  01/28/15 64.4 kg (141 lb 15.6 oz)  01/26/15 67.223 kg (148 lb 3.2 oz)  12/01/14 71.396 kg (157 lb 6.4 oz)    Physical Exam:  Constitutional: He appears well-developed and well-nourished. No distress.  HENT: oral mucosa pink/dentition poor Head: Normocephalic and atraumatic.  Right Ear: External ear normal.  Left Ear: External ear normal.  Eyes: Conjunctivae and EOM are normal. Pupils are equal, round, and reactive to light.  Neck: No tracheal deviation present. No thyromegaly present.  Cardiovascular: Normal rate and regular rhythm. Exam reveals no gallop and no friction rub.  No murmur  heard. Respiratory: No respiratory distress. He has no wheezes. He has no rales.  GI: Soft. Bowel sounds are normal. He exhibits no distension. There is no tenderness.  Neurological: left facial droop Motor strength is 5/5 in the deltoid biceps triceps grip hip flexor and knee extensor ankle dorsiflexor Left upper extremity 4- to 4/5 in the deltoid, biceps, triceps, grip, hip flexor, knee extensor, ankle dorsiflexor and plantar flexor---inconsistent effort Sensation intact to light touch in bilateral upper and lower limbs\ mild left pronator drift He exhibits mild left neglect Psych: very flat    Assessment/Plan: 1. Functional deficits secondary to right MCA infarct which require 3+ hours per day of interdisciplinary therapy in a comprehensive inpatient rehab setting. Physiatrist is providing close team supervision and 24 hour management of active medical problems listed below. Physiatrist and rehab team continue to assess barriers to discharge/monitor patient progress toward functional and medical goals. FIM: FIM - Bathing Bathing Steps Patient Completed: Chest, Left Arm, Abdomen, Front perineal area, Buttocks, Right upper leg, Left upper leg, Right lower leg (including foot) Bathing: 4: Min-Patient completes 8-9 45f 10 parts or 75+ percent  FIM - Upper Body Dressing/Undressing Upper body dressing/undressing steps patient completed: Thread/unthread right sleeve of pullover shirt/dresss, Thread/unthread left sleeve of pullover shirt/dress Upper body dressing/undressing: 3: Mod-Patient completed 50-74% of tasks FIM - Lower Body Dressing/Undressing Lower body dressing/undressing steps patient completed: Thread/unthread right underwear leg, Pull underwear up/down, Thread/unthread right pants leg, Pull pants up/down Lower body dressing/undressing: 3: Mod-Patient completed 50-74% of tasks  FIM - Toileting Toileting steps completed by patient: Performs perineal hygiene Toileting Assistive  Devices: Grab bar or rail for support Toileting: 2: Max-Patient completed 1 of 3 steps  FIM - Radio producer Devices: Grab bars Toilet Transfers: 4-From toilet/BSC: Min A (steadying Pt. > 75%), 4-To toilet/BSC: Min A (steadying Pt. > 75%)  FIM - Bed/Chair Transfer Bed/Chair Transfer: 5: Supine > Sit: Supervision (verbal cues/safety issues), 4: Sit > Supine: Min A (steadying pt. > 75%/lift 1 leg), 4: Bed > Chair or W/C: Min A (steadying Pt. > 75%), 4: Chair or W/C > Bed: Min A (steadying Pt. > 75%)  FIM - Locomotion: Wheelchair Locomotion: Wheelchair: 0: Activity did not occur FIM - Locomotion: Ambulation Locomotion: Ambulation: 0: Activity did not occur  Comprehension Comprehension Mode: Auditory Comprehension: 5-Understands complex 90% of the time/Cues < 10% of the time  Expression Expression Mode: Verbal Expression: 5-Expresses complex 90% of the time/cues < 10% of the time  Social Interaction Social Interaction: 4-Interacts appropriately 75 - 89% of the time - Needs redirection for appropriate language or to initiate interaction.  Problem Solving Problem Solving: 4-Solves basic 75 - 89% of the time/requires cueing 10 - 24% of the  time  Memory Memory: 4-Recognizes or recalls 75 - 89% of the time/requires cueing 10 - 24% of the time  Medical Problem List and Plan: 1. Functional deficits secondary to embolic right MCA infarct 2. DVT Prophylaxis/Anticoagulation: Pharmaceutical: Coumadin 3. Pain Management: Tylenol prn 4. Mood: Appears to have anxiety with occasional hypoventilation. LCSW to follow for evaluation and support.  5. Neuropsych: This patient is intermittently capable of making decisions on his own behalf. Encourage compliance with all medications.  6. Skin/Wound Care: Routine pressure relief measures.  7. Fluids/Electrolytes/Nutrition: Monitor I/O. bmet essentially normal today. Encourage nectar liquids.  8. Chronic Systolic CHF:  Low salt diet. Monitor weights daily. Lasix every other day.  9. DM type 2: Monitor BS with achs checks.  -increase lantus to 20u qhs LOS (Days) 2 A FACE TO FACE EVALUATION WAS PERFORMED  SWARTZ,ZACHARY T 01/28/2015 8:14 AM

## 2015-01-28 NOTE — Progress Notes (Signed)
Occupational Therapy Session Note  Patient Details  Name: Dennis [name] MRN: [id number] Date of Birth: [birth year]  Today's Date: 01/28/2015 OT Individual Time: 1400-1430 OT Individual Time Calculation (min): 30 min    Short Term Goals: Week 1:  OT Short Term Goal 1 (Week 1): STG=LTG  Skilled Therapeutic Interventions/Progress Updates:    Engaged in [place] with focus on LUE motor control and strengthening.  Pt in bed upon arrival, requesting to focus on LUE.  Engaged in reaching activity with focus on motor control and graded movements progressing to providing minimal resistance with reaching/shoulder flexion to approx 90 degrees.  Provided 1# medicine ball to promote attention to [name] with reaching and challenge against gravity with pt demonstrating difficulty maintaining grasp on ball.  Pt with difficulty when releasing items, requiring cues to visually attend to hand to increase coordination and control with movement.  Educated on carryover from activity to self-care and self-feeding tasks with cup and lotion bottle.    Therapy Documentation Precautions:  Precautions Precautions: Fall Precaution Comments: L side hemiplegia & neglect Restrictions Weight Bearing Restrictions: No General:   Vital Signs: Therapy Vitals Temp: 97.9 F (36.6 C) Temp Source: Oral Pulse Rate: 95 Resp: 16 BP: (!) 88/63 mmHg Patient Position (if appropriate): Lying Oxygen Therapy SpO2: 98 % O2 Device: Not Delivered Pain:  Pt with no c/o pain  See FIM for current functional status  Therapy/Group: Individual Therapy  Simonne Come 01/28/2015, 3:25 PM

## 2015-01-28 NOTE — Progress Notes (Signed)
ANTICOAGULATION CONSULT NOTE - Follow Up Consult  Pharmacy Consult for Coumadin Indication: stroke and non-compaction cardiomyopathy  No Known Allergies  Patient Measurements: Height: 6' (182.9 cm) Weight: 141 lb 15.6 oz (64.4 kg) IBW/kg (Calculated) : 77.6  Vital Signs: Temp: 98 F (36.7 C) (04/17 0600) Temp Source: Oral (04/17 0600) BP: 86/52 mmHg (04/17 0600) Pulse Rate: 97 (04/17 0600)  Labs:  Recent Labs  01/26/15 0628 01/27/15 0647 01/27/15 0817 01/28/15 0753  HGB  --  13.7  --   --   HCT  --  42.4  --   --   PLT  --  180  --   --   LABPROT 14.3  --  19.6* 19.7*  INR 1.10  --  1.65* 1.65*  CREATININE 1.08 1.04  --   --     Estimated Creatinine Clearance: 75.7 mL/min (by C-G formula based on Cr of 1.04).   Medications:  Coumadin 10mg  given 4/14 and 4/15  Assessment: 54 year old male on long-term Coumadin for non-compaction cardiomyopathy admitted 4/13 with new stroke.  He was noncompliant with Coumadin prior to admission.  His INR increased significantly after only 2 doses of Coumadin but stabilized at a subtherapeutic value today after a dose decrease.  Goal of Therapy:  INR 2-3   Plan:  Increase Coumadin to 10mg  today Daily PT/INR Stop aspirin when INR >2 per neuro  Legrand Como, Pharm.D., BCPS, AAHIVP Clinical Pharmacist Phone: [phone] or [phone] 01/28/2015, 11:18 AM

## 2015-01-29 ENCOUNTER — Inpatient Hospital Stay (HOSPITAL_COMMUNITY): Payer: Medicare Other

## 2015-01-29 ENCOUNTER — Inpatient Hospital Stay (HOSPITAL_COMMUNITY): Payer: Medicare Other | Admitting: Speech Pathology

## 2015-01-29 ENCOUNTER — Ambulatory Visit (HOSPITAL_COMMUNITY): Payer: Self-pay

## 2015-01-29 ENCOUNTER — Inpatient Hospital Stay (HOSPITAL_COMMUNITY): Payer: Medicare Other | Admitting: Occupational Therapy

## 2015-01-29 LAB — BASIC METABOLIC PANEL
Anion gap: 8 (ref 5–15)
BUN: 21 mg/dL (ref 6–23)
CO2: 25 mmol/L (ref 19–32)
Calcium: 8.5 mg/dL (ref 8.4–10.5)
Chloride: 101 mmol/L (ref 96–112)
Creatinine, Ser: 0.99 mg/dL (ref 0.50–1.35)
GFR calc Af Amer: >90 mL/min (ref >90)
GFR calc non Af Amer: >90 mL/min (ref >90)
Glucose, Bld: 326 mg/dL — ABNORMAL HIGH (ref 70–99)
Potassium: 4.3 mmol/L (ref 3.5–5.1)
Sodium: 134 mmol/L — ABNORMAL LOW (ref 135–145)

## 2015-01-29 LAB — GLUCOSE, CAPILLARY
Glucose-Capillary: 261 mg/dL — ABNORMAL HIGH (ref 70–99)
Glucose-Capillary: 72 mg/dL (ref 70–99)
Glucose-Capillary: 159 mg/dL — ABNORMAL HIGH (ref 70–99)
Glucose-Capillary: 184 mg/dL — ABNORMAL HIGH (ref 70–99)

## 2015-01-29 LAB — PROTIME-INR
INR: 1.91 — ABNORMAL HIGH (ref 0.00–1.49)
Prothrombin Time: 22.1 seconds — ABNORMAL HIGH (ref 11.6–15.2)

## 2015-01-29 MED ORDER — WARFARIN SODIUM 7.5 MG PO TABS
7.5000 mg | ORAL_TABLET | Freq: Once | ORAL | Status: AC
  Administered 2015-01-29: 7.5 mg via ORAL
  Filled 2015-01-29 (×2): qty 1

## 2015-01-29 NOTE — Progress Notes (Signed)
Patient information reviewed and entered into eRehab system by Marie Noel, RN, CRRN, PPS Coordinator.  Information including medical coding and functional independence measure will be reviewed and updated through discharge.     Per nursing patient was given "Data Collection Information Summary for Patients in Inpatient Rehabilitation Facilities with attached "Privacy Act Statement-Health Care Records" upon admission.  

## 2015-01-29 NOTE — Progress Notes (Signed)
ANTICOAGULATION CONSULT NOTE - Follow Up Consult  Pharmacy Consult for coumadin Indication: hx of left ventricular noncompaction  No Known Allergies  Patient Measurements: Height: 6' (182.9 cm) Weight: 144 lb 13.5 oz (65.7 kg) IBW/kg (Calculated) : 77.6 Heparin Dosing Weight:   Vital Signs: Temp: 98 F (36.7 C) (04/18 0537) Temp Source: Oral (04/18 0537) BP: 93/67 mmHg (04/18 0537) Pulse Rate: 101 (04/18 0537)  Labs:  Recent Labs  01/27/15 0647 01/27/15 0817 01/28/15 0753 01/29/15 0555  HGB 13.7  --   --   --   HCT 42.4  --   --   --   PLT 180  --   --   --   LABPROT  --  19.6* 19.7* 22.1*  INR  --  1.65* 1.65* 1.91*  CREATININE 1.04  --   --  0.99    Estimated Creatinine Clearance: 81.1 mL/min (by C-G formula based on Cr of 0.99).   Medications:  Scheduled:  . aspirin EC  81 mg Oral Daily  . atorvastatin  20 mg Oral q1800  . [START ON 01/31/2015] furosemide  20 mg Oral Q48H  . insulin aspart  0-20 Units Subcutaneous TID WC  . insulin aspart  5 Units Subcutaneous TID WC  . insulin glargine  20 Units Subcutaneous QHS  . senna-docusate  1 tablet Oral QHS  . Warfarin - Physician Dosing Inpatient   Does not apply q1800   Infusions:    Assessment: 54 yo male with hx of left ventricular noncompaction is currently on subtherapeutic coumadin.  INR today is up to 1.91 from 1.65.   Goal of Therapy:  INR 2-3 Monitor platelets by anticoagulation protocol: Yes   Plan:  - coumadin 7.5 mg po x - INR in am  So, Tsz-Yin 01/29/2015,8:04 AM

## 2015-01-29 NOTE — Care Management Note (Signed)
Inpatient Rehabilitation Center Individual Statement of Services  Patient Name:  Dennis [name]  Date:  01/29/2015  Welcome to the [facility].  Our goal is to provide you with an individualized program based on your diagnosis and situation, designed to meet your specific needs.  With this comprehensive rehabilitation program, you will be expected to participate in at least 3 hours of rehabilitation therapies Monday-Friday, with modified therapy programming on the weekends.  Your rehabilitation program will include the following services:  Physical Therapy (PT), Occupational Therapy (OT), Speech Therapy (ST), 24 hour per day rehabilitation nursing, Therapeutic Recreaction (TR), Neuropsychology, Case Management (Social Worker), Rehabilitation Medicine, Nutrition Services and Pharmacy Services  Weekly team conferences will be held on Wednesday to discuss your progress.  Your Social Worker will talk with you frequently to get your input and to update you on team discussions.  Team conferences with you and your family in attendance may also be held.  Expected length of stay: 12-14 days Overall anticipated outcome: mod/i-supervision level  Depending on your progress and recovery, your program may change. Your Social Worker will coordinate services and will keep you informed of any changes. Your Social Worker's name and contact numbers are listed  below.  The following services may also be recommended but are not provided by the [place] will be made to provide these services after discharge if needed.  Arrangements include referral to agencies that provide these services.  Your insurance has been verified to be:  [employer]-Medicare Your primary doctor is:  Mauricio Po  Pertinent information will be shared with your doctor and your insurance  company.  Social Worker:  Ovidio Kin, [place] or ([phone]  Information discussed with and copy given to patient by: Elease Hashimoto, 01/29/2015, 1:01 PM

## 2015-01-29 NOTE — Progress Notes (Signed)
Recreational Therapy Assessment and Plan  Patient Details  Name: Dennis [name] MRN: [id number] Date of Birth: 1961/02/27 Today's Date: 01/29/2015  Rehab Potential: Good ELOS: 10 days   Assessment Clinical Impression: Problem List:  Patient Active Problem List   Diagnosis Date Noted  . CVA (cerebral infarction) 01/26/2015  . Left-sided weakness   . Embolic stroke involving right middle cerebral artery   . Cardiac LV ejection fraction 10-20%   . LV non-compaction cardiomyopathy   . Acute systolic heart failure 00/86/7619  . Acute ischemic stroke 01/24/2015  . Cocaine abuse 01/24/2015  . H/O noncompliance with medical treatment, presenting hazards to health 01/24/2015  . Tobacco abuse 01/24/2015  . Essential hypertension 01/24/2015  . Hypotension 01/24/2015  . Hyperlipidemia 01/24/2015  . Dysphagia following cerebral infarction 01/24/2015  . Hemiplegia affecting left nondominant side 01/24/2015  . Diabetes mellitus type 2, uncontrolled, with complications 50/93/2671  . Hyperlipidemia associated with type 2 diabetes mellitus 11/30/2014  . CAD (coronary artery disease) 11/16/2014  . Acute on chronic systolic CHF (congestive heart failure) 09/21/2014  . Chest pain on breathing 09/21/2014  . Chronic systolic heart failure 24/58/0998  . Acute on chronic systolic heart failure   . Automatic implantable cardioverter-defibrillator in situ   . Left ventricular noncompaction 05/27/2014  . Dyspnea 05/26/2014  . Chest discomfort 08/09/2013  . Long term (current) use of anticoagulants 08/09/2013  . Drug abuse and dependence   . CHF (congestive heart failure)   . BP (high blood pressure) 03/04/2013  . Cardiomyopathy, ischemic 07/21/2011  . Type II diabetes mellitus with ophthalmic manifestations 09/13/2007  . TOBACCO ABUSE 09/13/2007  . WEIGHT LOSS, ABNORMAL 09/13/2007  . SOB  09/13/2007    Past Medical History:  Past Medical History  Diagnosis Date  . Chronic systolic heart failure     a) Mixed ICM/NICM b) RHC (05/2014): RA 2, RV 19/2/3, PA 22/14 (18), PCWP 6, Fick CO/CI: 5.2 / 2.7, PVR 2.3 WU, PA 60% and 64% c) ECHO (05/2014): EF 20-25%, diff HK, akinesis entireanteroseptal myocardium, triv AI, mod MR, LA mod/sev dilated  . Automatic implantable cardioverter-defibrillator in situ   . Heart murmur   . Sleep apnea     "cleared after T&A"  . Left ventricular noncompaction   . Ischemic cardiomyopathy     a) Coronary angiography (12/2008) at [facility]: Lmain: nl, LAD mid 100% stenosis with left to left and right-to-left collaterals to the distal LAD; Lcx: nl, RCA nl.   . Type II diabetes mellitus   . High cholesterol   . Pneumonia 1988  . Drug abuse and dependence   . CHF (congestive heart failure)    Past Surgical History:  Past Surgical History  Procedure Laterality Date  . Forearm fracture surgery Left 1980  . Cardiac defibrillator placement  03/2011  . Cholecystectomy  1994  . Right heart catheterization N/A 05/30/2014    Procedure: RIGHT HEART CATH; Surgeon: Jolaine Artist, MD; Location: [facility] CATH LAB; Service: Cardiovascular; Laterality: N/A;  . Fracture surgery    . Cardiac catheterization  05/2014  . Tonsillectomy and adenoidectomy  ~ 1998    Assessment & Plan Clinical Impression: Dennis [name] is a 54 y.o. male with history of chronic systolic HF, OSA, DM type 2, Left ventricular non-compaction on chronic coumadin, s/p ICD, medication non-compliance; who was found down by family with slurred speech, left facial droop and left sided weakness. Blood sugar- 536 and UDS positive for cocaine. CT head with early signs of R-MCA infarct. Swallow evaluation done  revealing mild oropharyngeal dysphagia and patient placed on DIII, nectar liquids. 2D echo done today. Cognitive  evaluation done revealing impulsivity as well as subtle short term memory deficits. CTA head/neck done revealing evolving anterior division right MCA infarct. Dr. Erlinda Hong recommended resuming Coumadin for embolic stroke due to low EF. He has had complaints of SOB and was treated with dose of IV lasix. CXR with NAD. Patient with resultant Left hemiparesis, left inattention, fluctuating bouts of lethargy as well as poor insight/awareness. Patient transferred to CIR on 01/26/2015 .      Pt presents with decreased activity tolerance, decreased functional mobility, decreased balance, ataxia, decreased coordination, left inattention, decreased attention, decreased problem solving, decreased awareness Limiting pt's independence with leisure/community pursuits.   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, broadcasting) Leisure Participation Style: With Family/Friends Psychosocial / Spiritual Patient agreeable to Pet Therapy: Yes Does patient have pets?: No Social interaction - Mood/Behavior: Cooperative Academic librarian Appropriate for Education?: Yes Patient Agreeable to Outing?: Yes Strengths/Weaknesses Patient Strengths/Abilities: Willingness to participate Patient weaknesses: Physical limitations TR Patient demonstrates impairments in the following area(s): Endurance;Motor  Plan Rec Therapy Plan Is patient appropriate for Therapeutic Recreation?: Yes Rehab Potential: Good Treatment times per week: Min 1 time per week >20 minutes Estimated Length of Stay: 10 days TR Treatment/Interventions: Adaptive equipment instruction;1:1 session;Balance/vestibular training;Functional mobility training;Community reintegration;Cognitive remediation/compensation;Patient/family education;Therapeutic  activities;Recreation/leisure participation;Therapeutic exercise;UE/LE Coordination activities  Recommendations for other services: None  Discharge Criteria: Patient will be discharged from TR if patient refuses treatment 3 consecutive times without medical reason.  If treatment goals not met, if there is a change in medical status, if patient makes no progress towards goals or if patient is discharged from hospital.  The above assessment, treatment plan, treatment alternatives and goals were discussed and mutually agreed upon: by patient  [place] 01/29/2015, 1:20 PM

## 2015-01-29 NOTE — Progress Notes (Signed)
Occupational Therapy Session Note  Patient Details  Name: Nancy Manuele MRN: [id number] Date of Birth: 12-Dec-1960  Today's Date: 01/29/2015 OT Individual Time: 1100-1200 OT Individual Time Calculation (min): 60 min    Skilled Therapeutic Interventions/Progress Updates:    Pt began session with pt transitioning from supine to sitting EOB with close supervision.  He needed max assist for tying his shoes but he was able to donn them with supervision.  Min steady assist needed with transfer to the bathroom to urinate in standing.  Min steady assist for balance throughout task including washing his hands at the sink. He then ambulated down to the therapy gym with min steady assist.  Noted pt with increased trunk flexion to the left and decreased left hip control in weightbearing.  Worked in quadriped for increased left shoulder stability and UE strengthening.  Pt only tolerating approximately 1 minute of sustained weightbearing before fatiguing.  Needs mod encouragement to try harder.  Progressed to focusing on LUE functional reach in sitting with pt grasping stacking cups.  Mod facilitation to avoid shoulder hike for reaching at knee level.  Pt with increased motor planning difficulty of opening digits to grasp cup as well as letting it go.  Ambulated back to the room at end of session.  He was left with call button within reach and safety belt in place.  Therapy Documentation Precautions:  Precautions Precautions: Fall Precaution Comments: L side hemiplegia & neglect Restrictions Weight Bearing Restrictions: No  Vital Signs: Therapy Vitals Pulse Rate: (!) 105 Resp: 20 BP: 95/68 mmHg Patient Position (if appropriate): Sitting Oxygen Therapy SpO2: 97 % O2 Device: Not Delivered Pain: Pain Assessment Pain Assessment: No/denies pain Pain Score: 3  ADL: See FIM for current functional status  Therapy/Group: Individual Therapy  MCGUIRE,JAMES OTR/L 01/29/2015, 12:19 PM

## 2015-01-29 NOTE — Progress Notes (Signed)
Social Work Assessment and Plan Social Work Assessment and Plan  Patient Details  Name: Dennis [name] MRN: [id number] Date of Birth: 1961-09-18  Today's Date: 01/29/2015  Problem List:  Patient Active Problem List   Diagnosis Date Noted  . Stroke   . Stroke with cerebral ischemia   . CVA (cerebral infarction) 01/26/2015  . Left-sided weakness   . Embolic stroke involving right middle cerebral artery   . Cardiac LV ejection fraction 10-20%   . LV non-compaction cardiomyopathy   . Acute systolic heart failure 32/20/2542  . Acute ischemic stroke 01/24/2015  . Cocaine abuse 01/24/2015  . H/O noncompliance with medical treatment, presenting hazards to health 01/24/2015  . Tobacco abuse 01/24/2015  . Essential hypertension 01/24/2015  . Hypotension 01/24/2015  . Hyperlipidemia 01/24/2015  . Dysphagia following cerebral infarction 01/24/2015  . Hemiplegia affecting left nondominant side 01/24/2015  . Diabetes mellitus type 2, uncontrolled, with complications 70/62/3762  . Hyperlipidemia associated with type 2 diabetes mellitus 11/30/2014  . CAD (coronary artery disease) 11/16/2014  . Acute on chronic systolic CHF (congestive heart failure) 09/21/2014  . Chest pain on breathing 09/21/2014  . Chronic systolic heart failure 83/15/1761  . Acute on chronic systolic heart failure   . Automatic implantable cardioverter-defibrillator in situ   . Left ventricular noncompaction 05/27/2014  . Dyspnea 05/26/2014  . Chest discomfort 08/09/2013  . Long term (current) use of anticoagulants 08/09/2013  . Drug abuse and dependence   . CHF (congestive heart failure)   . BP (high blood pressure) 03/04/2013  . Cardiomyopathy, ischemic 07/21/2011  . Type II diabetes mellitus with ophthalmic manifestations 09/13/2007  . TOBACCO ABUSE 09/13/2007  . WEIGHT LOSS, ABNORMAL 09/13/2007  . SOB 09/13/2007   Past Medical History:  Past Medical History  Diagnosis Date  . Chronic systolic heart failure    a) Mixed ICM/NICM b) RHC (05/2014): RA 2, RV 19/2/3, PA 22/14 (18), PCWP 6, Fick CO/CI: 5.2 / 2.7, PVR 2.3 WU, PA 60% and 64% c) ECHO (05/2014): EF 20-25%, diff HK, akinesis entireanteroseptal myocardium, triv AI, mod MR, LA mod/sev dilated  . Automatic implantable cardioverter-defibrillator in situ   . Heart murmur   . Sleep apnea     "cleared after T&A"  . Left ventricular noncompaction   . Ischemic cardiomyopathy     a) Coronary angiography (12/2008) at [facility]: Lmain: nl, LAD mid 100% stenosis with left to left and right-to-left collaterals to the distal LAD; Lcx: nl, RCA nl.    . Type II diabetes mellitus   . High cholesterol   . Pneumonia 1988  . Drug abuse and dependence   . CHF (congestive heart failure)    Past Surgical History:  Past Surgical History  Procedure Laterality Date  . Forearm fracture surgery Left 1980  . Cardiac defibrillator placement  03/2011  . Cholecystectomy  1994  . Right heart catheterization N/A 05/30/2014    Procedure: RIGHT HEART CATH;  Surgeon: Jolaine Artist, MD;  Location: [facility] CATH LAB;  Service: Cardiovascular;  Laterality: N/A;  . Fracture surgery    . Cardiac catheterization  05/2014  . Tonsillectomy and adenoidectomy  ~ 1998   Social History:  reports that he has been smoking Cigarettes.  He has a 3.1 pack-year smoking history. He has never used smokeless tobacco. He reports that he drinks alcohol. He reports that he does not use illicit drugs.  Family / Support Systems Marital Status: Single Patient Roles: Partner, Parent Spouse/Significant Other: Pat Keith-girlfriend [phone] Children: has an 86  yo daughter-stays with him Wed & Thurs Other Supports: Alstyne Cona-Mom  [phone] Anticipated Caregiver: Mom and girlfriend Ability/Limitations of Caregiver: Mom is elderly and has health issues, [name] can assist Caregiver Availability: Other (Comment) (almost 24 hr, may be 1-2 hours not covered) Family Dynamics: Close with Mom and girlfriend  who together will try to cover 24 hr.  Both are involved and supportive.  Pt does not want to burden them and wants to be as independent as he can be before he leaves here.  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  Will make sure his Mom is aware or here if any decisions need to be made.   Abuse/Neglect Physical Abuse: Denies Verbal Abuse: Denies Sexual Abuse: Denies Exploitation of patient/patient's resources: Denies Self-Neglect: Denies  Emotional Status Pt's affect, behavior adn adjustment status: Pt is feeling better now not SOB like he was in therapies this am.  He feels he was trying to push himself too hard. He wants to regain his independence from this stroke.  He is encouraged by the progress he has made thus far in rehab and so gald he passed his swallow test. Recent Psychosocial Issues: other health issues-disabled due to heart issues Pyschiatric History: No history may benefit from neuro-psych to see while here for his coping. Deferred depression screen due to pt tired from therapies this am and wanted to rest before lunch. Will check back and monitor him through therapies. Substance Abuse History: History of cocaine abuse-on drug screen was positive.  Pt denies using but could not answer the positive drug screen upon admission.  He goes to NA three times a day according to him, but this worker wonders if this is true.  Will attempt to address while here.  Patient / Family Perceptions, Expectations & Goals Pt/Family understanding of illness & functional limitations: Pt can explain his stroke and deficits, to his arm and leg.  He talks with MD while he is rounding and feels  his questions are being answered. His girlfriend has a basic understanding also of his stroke and visits daily to see his progress. Premorbid pt/family roles/activities: Son, Father, Boyfriend, NA participate, brother, etc Anticipated changes in roles/activities/participation: resume Pt/family expectations/goals: Pt states: " I want to be able to do for myself, before I leave here."  [name] states: " I hope he can walk around, we can deal with his arm if he needs help with this."  [employer]: Other (Comment) (NA Groups) Premorbid Home Care/DME Agencies: None Transportation available at discharge: family and Film/video editor referrals recommended: Neuropsychology, Support group (specify)  Discharge Planning Living Arrangements: Parent Support Systems: Spouse/significant other, Children, Parent, Other relatives, Friends/neighbors Type of Residence: Private residence Insurance Resources: Multimedia programmer (specify) (UHC_Medicare) Financial Resources: SSD Financial Screen Referred: No Living Expenses: Lives with family Money Management: Patient Does the patient have any problems obtaining your medications?: Yes (Describe) (Pt reprots can't afford co-pays but does have insurance to cover percentage of meds) Home Management: Mom Patient/Family Preliminary Plans: Return home with Mom who can be there, not provide any assistance due to her own health issues.  His girlfriend-Pat can assist some. He will need to be mod/i-supervison level to be able to return home from rehab. Will await team's evaluations and go from there. Will look  into pt's co-pays for medicines, he  does receive a monthly check so he may need to re-evalute what he spends his money on. Social Work Anticipated Follow Up Needs: HH/OP, Support Group  Clinical Impression Pt seems to be motivated to improve and beginning to discuss changing his lifestyle habits. He needs to get to mod/i or at the very least  supervision level, since he does not have 24 hr care. Will have neuro-psych see while here to assist with coping and support.  Will develop a safe discharge plan.  Elease Hashimoto 01/29/2015, 1:50 PM

## 2015-01-29 NOTE — Progress Notes (Signed)
Inpatient Diabetes Program Recommendations  AACE/ADA: New Consensus Statement on Inpatient Glycemic Control (2013)  Target Ranges:  Prepandial:   less than 140 mg/dL      Peak postprandial:   less than 180 mg/dL (1-2 hours)      Critically ill patients:  140 - 180 mg/dL   Results for Dennis, [name] (MRN [id number]) as of 01/29/2015 08:34  Ref. Range 01/28/2015 06:03 01/28/2015 11:19 01/28/2015 16:08 01/28/2015 20:57 01/29/2015 06:49  Glucose-Capillary Latest Ref Range: 70-99 mg/dL 277 (H) 171 (H) 146 (H) 205 (H) 261 (H)    Inpatient Diabetes Program Recommendations Insulin - Basal: Patient takes Lantus 25 units QHS at home. Fasting glucose in the 200's. Please consider increasing basal insulin to Lantus 25 units QHS.  Thanks,  Tama Headings RN, MSN, [facility] Inpatient Diabetes Coordinator Team Pager [phone]

## 2015-01-29 NOTE — Progress Notes (Signed)
Speech Language Pathology Daily Session Note  Patient Details  Name: Keyan Folson MRN: [id number] Date of Birth: 1960/12/11  Today's Date: 01/29/2015 SLP Individual Time: 4098-1191 SLP Individual Time Calculation (min): 58 min  Short Term Goals: Week 1: SLP Short Term Goal 1 (Week 1): Patient will consume current diet without overt s/s of aspiration with Mod I for use of swallow strategies.  SLP Short Term Goal 2 (Week 1): Patient will demonstrate complex problem solving for functional and familiar tasks with supervision verbal cues.  SLP Short Term Goal 3 (Week 1): Patient will recall new, daily information with use  of external aids with supervision verbal cues.  SLP Short Term Goal 4 (Week 1): Patient will identify 2 physical and 2 cognitive deficits with supervision question cues.  SLP Short Term Goal 5 (Week 1): Patient will utilize call bell to request assistance with supervision verbal cues.  SLP Short Term Goal 6 (Week 1): Patient will demonstrate selective attention to tasks for 30 minutes with supervision cues for redirection.   Skilled Therapeutic Interventions:  Pt was seen for skilled ST targeting cognitive goals.  Upon arrival, pt was asleep in bed but easily awakened to voice and light touch.  Pt required extra time to achieve alertness and to initiate getting to the edge of the bed.  Pt transferred to wheelchair in order to maximize attention and alertness during structured therapeutic tasks.  SLP facilitated the session with a semi-complex money management task targeting functional problem solving and selective attention in a moderately distracting environment.  Pt counted money, made change, and generated values with coins and bills when named with overall supervision cues for working memory, organization, and error awareness.  Pt did require x1 instance of min assist verbal cuing for working memory and organization in the presence of task disruptions.  Pt was also noted to become  intermittently distracted by environmental stimuli; however, he self-initiated redirection to task with supervision cues.  Of note, pt was observed to consume cup sips of thin liquids with no overt s/s of aspiration.  Continue per current plan of care.       FIM:  Comprehension Comprehension Mode: Auditory Comprehension: 5-Understands complex 90% of the time/Cues < 10% of the time Expression Expression Mode: Verbal Expression: 5-Expresses complex 90% of the time/cues < 10% of the time Social Interaction Social Interaction: 4-Interacts appropriately 75 - 89% of the time - Needs redirection for appropriate language or to initiate interaction. Problem Solving Problem Solving: 4-Solves basic 75 - 89% of the time/requires cueing 10 - 24% of the time Memory Memory: 4-Recognizes or recalls 75 - 89% of the time/requires cueing 10 - 24% of the time  Pain Pain Assessment Pain Assessment: No/denies pain  Therapy/Group: Individual Therapy  Page, Selinda Orion 01/29/2015, 1:12 PM

## 2015-01-29 NOTE — Progress Notes (Addendum)
Physical Therapy Session Note  Patient Details  Name: Dennis [name] MRN: [id number] Date of Birth: August 11, 1961  Today's Date: 01/29/2015 PT Individual Time: 1410-1540 PT Individual Time Calculation (min): 90 min   Short Term Goals: Week 1:  PT Short Term Goal 1 (Week 1): Pt will demonstrate sit to stand with SBA with LRAD.  PT Short Term Goal 2 (Week 1): Pt will demonstrate midline positioning when lying in bed (instead of leaning to R side) at rest.  PT Short Term Goal 3 (Week 1): Pt will initiate ambulation with PT when blood pressure is stable.  PT Short Term Goal 4 (Week 1): Pt will initiate w/c propulsion with PT, as activity tolerance/upright tolerance improves.  PT Short Term Goal 5 (Week 1): Pt will initiate stair training with PT as blood pressure is stable.   Skilled Therapeutic Interventions/Progress Updates:   Assessed gait without AD: x 25' with mod assist for trunk and LLE stance stability, and w/c propulsion before instruction: mod assist using bil UEs, x 25'.  neuromuscular re-education for LLE attention, L stance and swing phase components in supine, L trunk righting with reaching out of BOS L and R in unsupported sitting,  LUE use for opening snack package of graham crackers, pouring milk sitting EOB; ;gait with weighted grocery cart, x 100' with min assist, cues to facilitate L trunk extension with LLE wt bearing.  Pt dyspneic 3/4 during reaching activity in sitting, requiring seated rest break x 1 minute.  Supine> sit with supervision.  No dizziness upon sitting.  Stand pivot transfer to L with min assist.  Pt stated he needed to use toilet for BM. See FIM.  Pt stated he feels constipated; RN consulted and pt drank prune juice during session.   W/c propulsion using hemi method, x 150' with supervision, cues for obstacles on L.  Educated pt on risk factors for stroke.    Pt left supine in bed, alarm set and all needs in place.    Therapy Documentation Precautions:   Precautions Precautions: Fall Precaution Comments: L side hemiplegia & neglect Restrictions Weight Bearing Restrictions: No   Vital Signs: Therapy Vitals Pulse Rate: 100 BP: 113/73 mmHg (after sitting activity) Patient Position (if appropriate): Sitting Oxygen Therapy SpO2: 100 % O2 Device: Not Delivered Pain: Pain Assessment Pain Assessment: No/denies pain   Locomotion : Ambulation Ambulation/Gait Assistance: 3: Mod assist     See FIM for current functional status  Therapy/Group: Individual Therapy  COOK,CAROLINE 01/29/2015, 3:53 PM

## 2015-01-29 NOTE — Progress Notes (Signed)
[place] PHYSICAL MEDICINE & REHABILITATION     PROGRESS NOTE    Subjective/Complaints: SOB in bed, no cough, no fever or chills, no leg swelling Pt denies nausea, vomiting, abdominal pain, diarrhea, chest pain, shortness of breath, palpitations, dizziness   Objective: Vital Signs: Blood pressure 93/67, pulse 101, temperature 98 F (36.7 C), temperature source Oral, resp. rate 18, height 6' (1.829 m), weight 65.7 kg (144 lb 13.5 oz), SpO2 100 %. Dg Swallowing [facility] Pathology  01/27/2015   Levi Aland, CCC-SLP     01/27/2015  2:03 PM   Objective Swallowing Evaluation: Modified Barium Swallowing Study   Patient Details  Name: Dennis [name] MRN: [id number] Date of Birth: 1961/08/03  Today's Date: 01/27/2015 Time:  -     Past Medical History:  Past Medical History  Diagnosis Date  . Chronic systolic heart failure     a) Mixed ICM/NICM b) RHC (05/2014): RA 2, RV 19/2/3, PA 22/14  (18), PCWP 6, Fick CO/CI: 5.2 / 2.7, PVR 2.3 WU, PA 60% and 64%  c) ECHO (05/2014): EF 20-25%, diff HK, akinesis entireanteroseptal  myocardium, triv AI, mod MR, LA mod/sev dilated  . Automatic implantable cardioverter-defibrillator in situ   . Heart murmur   . Sleep apnea     "cleared after T&A"  . Left ventricular noncompaction   . Ischemic cardiomyopathy     a) Coronary angiography (12/2008) at [facility]: Lmain: nl, LAD mid  100% stenosis with left to left and right-to-left collaterals to  the distal LAD; Lcx: nl, RCA nl.    . Type II diabetes mellitus   . High cholesterol   . Pneumonia 1988  . Drug abuse and dependence   . CHF (congestive heart failure)    Past Surgical History:  Past Surgical History  Procedure Laterality Date  . Forearm fracture surgery Left 1980  . Cardiac defibrillator placement  03/2011  . Cholecystectomy  1994  . Right heart catheterization N/A 05/30/2014    Procedure: RIGHT HEART CATH;  Surgeon: Jolaine Artist, MD;   Location: [facility] CATH LAB;  Service: Cardiovascular;  Laterality:  N/A;  . Fracture  surgery    . Cardiac catheterization  05/2014  . Tonsillectomy and adenoidectomy  ~ 1998   HPI:  Dennis [name] is a 54 y.o. male with history of chronic systolic  HF, OSA, DM type 2, Left ventricular non-compaction on chronic  coumadin, s/p ICD, medication non-compliance; who was found down  by family with slurred speech, left facial droop and left sided  weakness. Blood sugar- 536 and UDS positive for cocaine. CT head  with early signs of R-MCA infarct. Swallow evaluation done  revealing mild oropharyngeal dysphagia and patient placed on  DIII, nectar liquids. 2D echo done today. Cognitive evaluation  done revealing impulsivity as well as subtle short term memory  deficits. CTA head/neck done revealing evolving anterior  division right MCA infarct. Dr. Erlinda Hong recommended resuming  Coumadin for embolic stroke due to low EF. He has had complaints  of SOB and was treated with dose of IV lasix. CXR with NAD.  Patient with resultant Left hemiparesis, left inattention,  fluctuating bouts of lethargy as well as poor insight/awareness     Recommendation/Prognosis  Clinical Impression:   Dysphagia Diagnosis: Mild oral phase dysphagia Clinical impression: Pt presents with mild neuorgenic oral  dysphagia. Impairments characterized by decreased bolus control  and premature spillage of thin liquids to level of pyriform  sinuses with larger cup sips and use of straw. However  adequate  airway protection evidenced throughout study with no penetration  or aspiration with all consistencies trialed. Mild vallecular  residue noted that was cleared with additional swallow uncued by  clinician. Recommend regluar thin diet; meds whole with thin,  straws allowed. Intermittent superivision recommended and set up  assistance with meals secondary to residual left upper extremity  hemiparesis.    Swallow Evaluation Recommendations:  Diet Recommendations: Regular Liquid Administration via: Cup;Straw Medication Administration: Whole meds  with liquid Supervision: Intermittent supervision to cue for compensatory  strategies Compensations: Slow rate;Small sips/bites Postural Changes and/or Swallow Maneuvers: Seated upright 90  degrees Oral Care Recommendations: Oral care BID Follow up Recommendations: None    Prognosis:  Prognosis for Safe Diet Advancement: Good   Individuals Consulted: Consulted and Agree with Results and  Recommendations: Patient      SLP Assessment/Plan  Plan:         General: Date of Onset: 01/26/15 Type of Study: Modified Barium Swallowing Study Reason for Referral: Objectively evaluate swallowing function Diet Prior to this Study: Dysphagia 3 (soft);Nectar-thick liquids Respiratory Status: Room air Behavior/Cognition: Alert Oral Cavity - Dentition: Adequate natural dentition Self-Feeding Abilities: Needs set up;Able to feed self Patient Positioning: Upright in chair Baseline Vocal Quality: Clear Volitional Cough: Weak Volitional Swallow: Able to elicit Anatomy: Within functional limits   Reason for Referral:   Objectively evaluate swallowing function    Oral Phase: Oral Preparation/Oral Phase Oral Phase: Impaired Oral - Thin Oral - Thin Teaspoon: Weak lingual manipulation;Lingual/palatal  residue Oral - Thin Cup: Weak lingual manipulation Oral - Thin Straw: Weak lingual manipulation Oral - Solids Oral - Puree: Delayed oral transit;Weak lingual manipulation Oral - Regular: Delayed oral transit;Weak lingual  manipulation;Lingual/palatal residue Oral - Pill: Weak lingual manipulation Oral Phase - Comment Oral Phase - Comment: mild oral phase impairment   Pharyngeal Phase:  Pharyngeal Phase Pharyngeal Phase: Impaired Pharyngeal - Thin Pharyngeal - Thin Teaspoon: Within functional limits Pharyngeal - Thin Cup: Within functional limits Pharyngeal - Thin Straw: Premature spillage to pyriform sinuses Pharyngeal - Solids Pharyngeal - Puree: Pharyngeal residue - valleculae Pharyngeal - Regular: Pharyngeal residue - valleculae Pharyngeal -  Pill: Premature spillage to valleculae   Cervical Esophageal Phase      GN         Arvil Chaco MA, CCC-SLP Acute Care Speech Language Pathologist    Levi Aland 01/27/2015, 2:01 PM                     Recent Labs  01/27/15 0647  WBC 10.5  HGB 13.7  HCT 42.4  PLT 180    Recent Labs  01/27/15 0647 01/29/15 0555  NA 134* 134*  K 4.5 4.3  CL 99 101  GLUCOSE 342* 326*  BUN 16 21  CREATININE 1.04 0.99  CALCIUM 8.6 8.5   CBG (last 3)   Recent Labs  01/28/15 1608 01/28/15 2057 01/29/15 0649  GLUCAP 146* 205* 261*    Wt Readings from Last 3 Encounters:  01/29/15 65.7 kg (144 lb 13.5 oz)  01/26/15 67.223 kg (148 lb 3.2 oz)  12/01/14 71.396 kg (157 lb 6.4 oz)    Physical Exam:  Constitutional: He appears well-developed and well-nourished. No distress.  HENT: oral mucosa pink/dentition poor Head: Normocephalic and atraumatic.  Right Ear: External ear normal.  Left Ear: External ear normal.  Eyes: Conjunctivae and EOM are normal. Pupils are equal, round, and reactive to light.  Neck: No tracheal deviation present. No thyromegaly present.  Cardiovascular: Normal rate and regular rhythm. Exam reveals no gallop and no friction rub.  No murmur heard. Respiratory: No respiratory distress. He has no wheezes. He has no rales.  GI: Soft. Bowel sounds are normal. He exhibits no distension. There is no tenderness.  Neurological: left facial droop Motor strength is 5/5 in the deltoid biceps triceps grip hip flexor and knee extensor ankle dorsiflexor Left upper extremity 4- to 4/5 in the deltoid, biceps, triceps, grip, hip flexor, knee extensor, ankle dorsiflexor and plantar flexor---inconsistent effort Sensation intact to light touch in bilateral upper and lower limbs\ mild left pronator drift He exhibits mild left neglect Psych: very flat    Assessment/Plan: 1. Functional deficits secondary to right MCA infarct which require 3+ hours per day of interdisciplinary  therapy in a comprehensive inpatient rehab setting. Physiatrist is providing close team supervision and 24 hour management of active medical problems listed below. Physiatrist and rehab team continue to assess barriers to discharge/monitor patient progress toward functional and medical goals. See how pt does in PT/OT, if poor exercise tolerance would check sat and CXR, weight ais up one Kg, fluid balance positive yesterday, BMET normal, potential to go up on lasix if needed FIM: FIM - Bathing Bathing Steps Patient Completed: Chest, Left Arm, Abdomen, Front perineal area, Buttocks, Right upper leg, Left upper leg, Right lower leg (including foot) Bathing: 4: Min-Patient completes 8-9 53f 10 parts or 75+ percent  FIM - Upper Body Dressing/Undressing Upper body dressing/undressing steps patient completed: Thread/unthread right sleeve of pullover shirt/dresss, Thread/unthread left sleeve of pullover shirt/dress Upper body dressing/undressing: 3: Mod-Patient completed 50-74% of tasks FIM - Lower Body Dressing/Undressing Lower body dressing/undressing steps patient completed: Thread/unthread right underwear leg, Pull underwear up/down, Thread/unthread right pants leg, Pull pants up/down Lower body dressing/undressing: 3: Mod-Patient completed 50-74% of tasks  FIM - Toileting Toileting steps completed by patient: Performs perineal hygiene Toileting Assistive Devices: Grab bar or rail for support Toileting: 2: Max-Patient completed 1 of 3 steps  FIM - Radio producer Devices: Grab bars Toilet Transfers: 4-From toilet/BSC: Min A (steadying Pt. > 75%), 4-To toilet/BSC: Min A (steadying Pt. > 75%)  FIM - Bed/Chair Transfer Bed/Chair Transfer: 5: Supine > Sit: Supervision (verbal cues/safety issues), 4: Sit > Supine: Min A (steadying pt. > 75%/lift 1 leg), 4: Bed > Chair or W/C: Min A (steadying Pt. > 75%), 4: Chair or W/C > Bed: Min A (steadying Pt. > 75%)  FIM -  Locomotion: Wheelchair Locomotion: Wheelchair: 0: Activity did not occur FIM - Locomotion: Ambulation Locomotion: Ambulation: 0: Activity did not occur  Comprehension Comprehension Mode: Auditory Comprehension: 5-Understands complex 90% of the time/Cues < 10% of the time  Expression Expression Mode: Verbal Expression: 5-Expresses complex 90% of the time/cues < 10% of the time  Social Interaction Social Interaction: 4-Interacts appropriately 75 - 89% of the time - Needs redirection for appropriate language or to initiate interaction.  Problem Solving Problem Solving: 4-Solves basic 75 - 89% of the time/requires cueing 10 - 24% of the time  Memory Memory: 4-Recognizes or recalls 75 - 89% of the time/requires cueing 10 - 24% of the time  Medical Problem List and Plan: 1. Functional deficits secondary to embolic right MCA infarct 2. DVT Prophylaxis/Anticoagulation: Pharmaceutical: Coumadin 3. Pain Management: Tylenol prn 4. Mood: Appears to have anxiety with occasional hypoventilation. LCSW to follow for evaluation and support.  5. Neuropsych: This patient is intermittently capable of making decisions on his own behalf. Encourage compliance with  all medications.  6. Skin/Wound Care: Routine pressure relief measures.  7. Fluids/Electrolytes/Nutrition: Monitor I/O. bmet essentially normal today. Encourage nectar liquids.  8. Chronic Systolic CHF: Low salt diet. Monitor weights daily. Lasix every other day.  9. DM type 2:uncontrolled Monitor BS with achs checks.  -increase lantus to 25u qhs 10.  S/P pacemaker LOS (Days) 3 A FACE TO FACE EVALUATION WAS PERFORMED  KIRSTEINS,ANDREW E 01/29/2015 7:00 AM

## 2015-01-29 NOTE — IPOC Note (Addendum)
Overall Plan of Care [facility]) Patient Details Name: Dennis [name] MRN: [id number] DOB: February 05, 1961  Admitting Diagnosis: R CVA  Hospital Problems: Principal Problem:   Embolic stroke involving right middle cerebral artery Active Problems:   Acute on chronic systolic heart failure   Essential hypertension   Hemiplegia affecting left nondominant side   Diabetes mellitus type 2, uncontrolled, with complications     Functional Problem List: Nursing Safety, Skin Integrity, Nutrition  PT Balance, Behavior, Endurance, Motor, Perception, Safety  OT Balance, Endurance, Motor, Perception, Safety  SLP    TR Activity tolerance, functional mobility, balance, cognition, safety       Basic ADL's: OT Grooming, Bathing, Dressing, Toileting     Advanced  ADL's: OT       Transfers: PT Bed Mobility, Bed to Chair, Car, Manufacturing systems engineer, Metallurgist: PT Ambulation, Emergency planning/management officer, Stairs     Additional Impairments: OT Fuctional Use of Upper Extremity  SLP Swallowing, Social Cognition   Problem Solving, Memory, Attention, Awareness, Social Interaction  TR     Anticipated Outcomes Item Anticipated Outcome  Self Feeding independent  Swallowing  Mod I    Basic self-care  mod I  Toileting  mod I   Bathroom Transfers Mod I toilet; SBA with shower  Bowel/Bladder  Mod I  Transfers  supervision  Locomotion  supervision gait  Communication     Cognition  Supervision-Mod I   Pain  <4  Safety/Judgment  Supervision   Therapy Plan: PT Intensity: Minimum of 1-2 x/day ,45 to 90 minutes PT Frequency: 5 out of 7 days PT Duration Estimated Length of Stay: 10-14 days OT Intensity: Minimum of 1-2 x/day, 45 to 90 minutes OT Frequency: 5 out of 7 days OT Duration/Estimated Length of Stay: 12-14 days SLP Intensity: Minumum of 1-2 x/day, 30 to 90 minutes SLP Frequency: 3 to 5 out of 7 days SLP Duration/Estimated Length of Stay: 10-14 days   TR Duration/ELOS:  10  Days TR Frequency:  Min 1 time per week >20 minutes       Team Interventions: Nursing Interventions Patient/Family Education, Bowel Management, Disease Management/Prevention, Skin Care/Wound Management  PT interventions Ambulation/gait training, Disease management/prevention, Pain management, Stair training, Visual/perceptual remediation/compensation, Training and development officer, DME/adaptive equipment instruction, Patient/family education, Therapeutic Activities, Wheelchair propulsion/positioning, Cognitive remediation/compensation, Psychosocial support, Therapeutic Exercise, Functional electrical stimulation, Community reintegration, Functional mobility training, Skin care/wound management, UE/LE Strength taining/ROM, Discharge planning, Neuromuscular re-education, UE/LE Coordination activities, Splinting/orthotics  OT Interventions Training and development officer, Community reintegration, Discharge planning, DME/adaptive equipment instruction, Functional mobility training, Neuromuscular re-education, Pain management, Patient/family education, Psychosocial support, Self Care/advanced ADL retraining, Therapeutic Activities, Therapeutic Exercise, UE/LE Strength taining/ROM, UE/LE Coordination activities, Wheelchair propulsion/positioning  SLP Interventions Cognitive remediation/compensation, English as a second language teacher, Dysphagia/aspiration precaution training, Environmental controls, Functional tasks, Internal/external aids, Patient/family education, Therapeutic Activities  TR Interventions Recreation/leisure participation, Balance/Vestibular training, functional mobility, therapeutic activities, cognitive retraining/compensation, UE/LE strength/coordination, w/c mobility, community reintegration, pt/family education, adaptive equipment instruction/use, discharge planning, psychosocial support  SW/CM Interventions      Team Discharge Planning: Destination: PT-Home ,OT- Home , SLP-Home Projected Follow-up: PT-Home  health PT, OT-  Home health OT, SLP-24 hour supervision/assistance (TBD ) Projected Equipment Needs: PT-To be determined, OT- To be determined, SLP-None recommended by SLP Equipment Details: PT- , OT-  Patient/family involved in discharge planning: PT- Patient, Family member/caregiver,  OT-Patient, Family member/caregiver, SLP-Patient  MD ELOS: 10-14d Medical Rehab Prognosis:  Good Assessment: 54 y.o. male with history of chronic systolic HF, OSA, DM type 2,  Left ventricular non-compaction on chronic coumadin, s/p ICD, medication non-compliance; who was found down by family with slurred speech, left facial droop and left sided weakness. Blood sugar- 536 and UDS positive for cocaine. CT head with early signs of R-MCA infarct   Now requiring 24/7 Rehab RN,MD, as well as CIR level PT, OT and SLP.  Treatment team will focus on ADLs and mobility with goals set at sup mod I See Team Conference Notes for weekly updates to the plan of care

## 2015-01-30 ENCOUNTER — Inpatient Hospital Stay (HOSPITAL_COMMUNITY): Payer: Medicare Other | Admitting: Occupational Therapy

## 2015-01-30 ENCOUNTER — Inpatient Hospital Stay (HOSPITAL_COMMUNITY): Payer: Medicare Other | Admitting: *Deleted

## 2015-01-30 ENCOUNTER — Inpatient Hospital Stay (HOSPITAL_COMMUNITY): Payer: Medicare Other | Admitting: Speech Pathology

## 2015-01-30 LAB — BASIC METABOLIC PANEL
Anion gap: 6 (ref 5–15)
BUN: 17 mg/dL (ref 6–23)
CO2: 26 mmol/L (ref 19–32)
Calcium: 8.6 mg/dL (ref 8.4–10.5)
Chloride: 103 mmol/L (ref 96–112)
Creatinine, Ser: 0.95 mg/dL (ref 0.50–1.35)
GFR calc Af Amer: >90 mL/min (ref >90)
GFR calc non Af Amer: >90 mL/min (ref >90)
Glucose, Bld: 279 mg/dL — ABNORMAL HIGH (ref 70–99)
Potassium: 4.2 mmol/L (ref 3.5–5.1)
Sodium: 135 mmol/L (ref 135–145)

## 2015-01-30 LAB — CBC WITH DIFFERENTIAL/PLATELET
Basophils Absolute: 0 10*3/uL (ref 0.0–0.1)
Basophils Relative: 0 % (ref 0–1)
Eosinophils Absolute: 0.2 10*3/uL (ref 0.0–0.7)
Eosinophils Relative: 2 % (ref 0–5)
HCT: 37.4 % — ABNORMAL LOW (ref 39.0–52.0)
Hemoglobin: 12 g/dL — ABNORMAL LOW (ref 13.0–17.0)
Lymphocytes Relative: 28 % (ref 12–46)
Lymphs Abs: 2.7 10*3/uL (ref 0.7–4.0)
MCH: 30.4 pg (ref 26.0–34.0)
MCHC: 32.1 g/dL (ref 30.0–36.0)
MCV: 94.7 fL (ref 78.0–100.0)
Monocytes Absolute: 0.6 10*3/uL (ref 0.1–1.0)
Monocytes Relative: 6 % (ref 3–12)
Neutro Abs: 6.2 10*3/uL (ref 1.7–7.7)
Neutrophils Relative %: 64 % (ref 43–77)
Platelets: 189 10*3/uL (ref 150–400)
RBC: 3.95 MIL/uL — ABNORMAL LOW (ref 4.22–5.81)
RDW: 12.2 % (ref 11.5–15.5)
WBC: 9.6 10*3/uL (ref 4.0–10.5)

## 2015-01-30 LAB — GLUCOSE, CAPILLARY
Glucose-Capillary: 236 mg/dL — ABNORMAL HIGH (ref 70–99)
Glucose-Capillary: 89 mg/dL (ref 70–99)
Glucose-Capillary: 139 mg/dL — ABNORMAL HIGH (ref 70–99)
Glucose-Capillary: 191 mg/dL — ABNORMAL HIGH (ref 70–99)

## 2015-01-30 LAB — URINALYSIS, ROUTINE W REFLEX MICROSCOPIC
Bilirubin Urine: NEGATIVE
Glucose, UA: NEGATIVE mg/dL
Hgb urine dipstick: NEGATIVE
Ketones, ur: NEGATIVE mg/dL
Leukocytes, UA: NEGATIVE
Nitrite: NEGATIVE
Protein, ur: NEGATIVE mg/dL
Specific Gravity, Urine: 1.025 (ref 1.005–1.030)
Urobilinogen, UA: 1 mg/dL (ref 0.0–1.0)
pH: 6 (ref 5.0–8.0)

## 2015-01-30 LAB — PROTIME-INR
INR: 2.4 — ABNORMAL HIGH (ref 0.00–1.49)
Prothrombin Time: 26.4 seconds — ABNORMAL HIGH (ref 11.6–15.2)

## 2015-01-30 MED ORDER — SENNOSIDES-DOCUSATE SODIUM 8.6-50 MG PO TABS
1.0000 | ORAL_TABLET | Freq: Two times a day (BID) | ORAL | Status: DC
  Administered 2015-01-30 – 2015-01-31 (×4): 1 via ORAL
  Filled 2015-01-30 (×6): qty 1

## 2015-01-30 MED ORDER — WARFARIN SODIUM 7.5 MG PO TABS
7.5000 mg | ORAL_TABLET | Freq: Once | ORAL | Status: AC
  Administered 2015-01-30: 7.5 mg via ORAL
  Filled 2015-01-30: qty 1

## 2015-01-30 MED ORDER — FUROSEMIDE 20 MG PO TABS
20.0000 mg | ORAL_TABLET | ORAL | Status: DC
  Administered 2015-01-31: 20 mg via ORAL
  Filled 2015-01-30 (×3): qty 1

## 2015-01-30 MED ORDER — SORBITOL 70 % SOLN
30.0000 mL | Freq: Every day | Status: DC | PRN
  Administered 2015-01-30 – 2015-01-31 (×2): 30 mL via ORAL
  Filled 2015-01-30: qty 30

## 2015-01-30 NOTE — Progress Notes (Signed)
Speech Language Pathology Daily Session Note  Patient Details  Name: Jaycion Treml MRN: [id number] Date of Birth: 05/01/1961  Today's Date: 01/30/2015 SLP Individual Time: 0905-1001 SLP Individual Time Calculation (min): 56 min  Short Term Goals: Week 1: SLP Short Term Goal 1 (Week 1): Patient will consume current diet without overt s/s of aspiration with Mod I for use of swallow strategies.  SLP Short Term Goal 2 (Week 1): Patient will demonstrate complex problem solving for functional and familiar tasks with supervision verbal cues.  SLP Short Term Goal 3 (Week 1): Patient will recall new, daily information with use  of external aids with supervision verbal cues.  SLP Short Term Goal 4 (Week 1): Patient will identify 2 physical and 2 cognitive deficits with supervision question cues.  SLP Short Term Goal 5 (Week 1): Patient will utilize call bell to request assistance with supervision verbal cues.  SLP Short Term Goal 6 (Week 1): Patient will demonstrate selective attention to tasks for 30 minutes with supervision cues for redirection.   Skilled Therapeutic Interventions:   Pt was seen for skilled ST targeting cognitive goals.  Upon arrival, pt was asleep in bed but easily awakened to voice and light touch.  Pt was transferred to wheelchair to maximize attention and alertness during structured therapeutic tasks.  Pt initiated a request to use the restroom and required supervision for use of safety precautions when transferring on and off the toilet.  SLP facilitated the session with a semi-complex medication management task targeting functional problem solving for home management.  Pt loaded a pill box with medications of varying dosages and frequencies with the use of a written visual aid and min assist verbal cues for working memory and organization.  Pt continues to be limited by fatigue during therapies and required intermittent auditory and tactile stimulation for alertness.  Continue per  current plan of care.     FIM:  Comprehension Comprehension Mode: Auditory Comprehension: 5-Understands complex 90% of the time/Cues < 10% of the time Expression Expression Mode: Verbal Expression: 5-Expresses complex 90% of the time/cues < 10% of the time Social Interaction Social Interaction: 4-Interacts appropriately 75 - 89% of the time - Needs redirection for appropriate language or to initiate interaction. Problem Solving Problem Solving: 4-Solves basic 75 - 89% of the time/requires cueing 10 - 24% of the time Memory Memory: 4-Recognizes or recalls 75 - 89% of the time/requires cueing 10 - 24% of the time FIM - Eating Eating Activity: 5: Supervision/cues  Pain Pain Assessment Pain Assessment: No/denies pain  Therapy/Group: Individual Therapy  Page, Selinda Orion 01/30/2015, 3:38 PM

## 2015-01-30 NOTE — Progress Notes (Signed)
[name] PHYSICAL MEDICINE & REHABILITATION     PROGRESS NOTE    Subjective/Complaints: No problems overnite.  States BPs running low since November.  Dr Dennis [name] is heart Dr. No leg swelling for a long time  Review of Systems - Negative except left side weak   Objective: Vital Signs: Blood pressure 97/61, pulse 99, temperature 97.5 F (36.4 C), temperature source Oral, resp. rate 16, height 6' (1.829 m), weight 68.8 kg (151 lb 10.8 oz), SpO2 100 %. No results found. No results for input(s): WBC, HGB, HCT, PLT in the last 72 hours.  Recent Labs  01/29/15 0555  NA 134*  K 4.3  CL 101  GLUCOSE 326*  BUN 21  CREATININE 0.99  CALCIUM 8.5   CBG (last 3)   Recent Labs  01/29/15 1204 01/29/15 1633 01/29/15 2110  GLUCAP 72 159* 184*    Wt Readings from Last 3 Encounters:  01/30/15 68.8 kg (151 lb 10.8 oz)  01/26/15 67.223 kg (148 lb 3.2 oz)  12/01/14 71.396 kg (157 lb 6.4 oz)    Physical Exam:  Constitutional: He appears well-developed and well-nourished. No distress.  HENT: oral mucosa pink/dentition poor Head: Normocephalic and atraumatic.  Ext - C/C/E Eyes: Conjunctivae and EOM are normal. Pupils are equal, round, and reactive to light.  Neck: No tracheal deviation present. No thyromegaly present.  Cardiovascular: Normal rate and regular rhythm. Exam reveals no gallop and no friction rub.  No murmur heard. Respiratory: No respiratory distress. He has no wheezes. He has no rales.  GI: Soft. Bowel sounds are normal. He exhibits no distension. There is no tenderness.  Neurological: left facial droop Motor strength is 5/5 in the Right deltoid biceps triceps grip hip flexor and knee extensor ankle dorsiflexor Left upper extremity 4- to 4/5 in the deltoid, biceps, triceps, grip, hip flexor, knee extensor, ankle dorsiflexor and plantar flexor---inconsistent effort Sensation intact to light touch in bilateral upper and lower limbs\ mild left pronator drift He  exhibits mild left neglect Psych: very flat    Assessment/Plan: 1. Functional deficits secondary to right MCA infarct which require 3+ hours per day of interdisciplinary therapy in a comprehensive inpatient rehab setting. Physiatrist is providing close team supervision and 24 hour management of active medical problems listed below. Physiatrist and rehab team continue to assess barriers to discharge/monitor patient progress toward functional and medical goals.  FIM: FIM - Bathing Bathing Steps Patient Completed: Chest, Left Arm, Abdomen, Front perineal area, Buttocks, Right upper leg, Left upper leg, Right lower leg (including foot) Bathing: 4: Min-Patient completes 8-9 61f10 parts or 75+ percent  FIM - Upper Body Dressing/Undressing Upper body dressing/undressing steps patient completed: Thread/unthread right sleeve of pullover shirt/dresss, Thread/unthread left sleeve of pullover shirt/dress Upper body dressing/undressing: 3: Mod-Patient completed 50-74% of tasks FIM - Lower Body Dressing/Undressing Lower body dressing/undressing steps patient completed: Thread/unthread right underwear leg, Pull underwear up/down, Thread/unthread right pants leg, Pull pants up/down Lower body dressing/undressing: 3: Mod-Patient completed 50-74% of tasks  FIM - Toileting Toileting steps completed by patient: Performs perineal hygiene, Adjust clothing prior to toileting, Adjust clothing after toileting Toileting Assistive Devices: Grab bar or rail for support Toileting: 4: Steadying assist  FIM - TRadio producerDevices: Grab bars Toilet Transfers: 4-From toilet/BSC: Min A (steadying Pt. > 75%), 4-To toilet/BSC: Min A (steadying Pt. > 75%)  FIM - Bed/Chair Transfer Bed/Chair Transfer: 5: Sit > Supine: Supervision (verbal cues/safety issues), 5: Supine > Sit: Supervision (verbal cues/safety issues), 4:  Chair or W/C > Bed: Min A (steadying Pt. > 75%), 4: Bed > Chair or W/C:  Min A (steadying Pt. > 75%)  FIM - Locomotion: Wheelchair Distance: 25 Locomotion: Wheelchair: 1: Travels less than 50 ft with moderate assistance (Pt: 50 - 74%) FIM - Locomotion: Ambulation Locomotion: Ambulation Assistive Devices: Other (comment) (weighted grocery cart) Ambulation/Gait Assistance: 3: Mod assist Locomotion: Ambulation: 1: Travels less than 50 ft with moderate assistance (Pt: 50 - 74%)  Comprehension Comprehension Mode: Auditory Comprehension: 5-Understands complex 90% of the time/Cues < 10% of the time  Expression Expression Mode: Verbal Expression: 5-Expresses complex 90% of the time/cues < 10% of the time  Social Interaction Social Interaction: 4-Interacts appropriately 75 - 89% of the time - Needs redirection for appropriate language or to initiate interaction.  Problem Solving Problem Solving: 4-Solves basic 75 - 89% of the time/requires cueing 10 - 24% of the time  Memory Memory: 4-Recognizes or recalls 75 - 89% of the time/requires cueing 10 - 24% of the time  Medical Problem List and Plan: 1. Functional deficits secondary to embolic right MCA infarct 2. DVT Prophylaxis/Anticoagulation: Pharmaceutical: Coumadin 3. Pain Management: Tylenol prn 4. Mood: Appears to have anxiety with occasional hypoventilation. LCSW to follow for evaluation and support.  5. Neuropsych: This patient is intermittently capable of making decisions on his own behalf. Encourage compliance with all medications.  6. Skin/Wound Care: Routine pressure relief measures.  7. Fluids/Electrolytes/Nutrition: Monitor I/O. bmet essentially normal today. Encourage nectar liquids.  8. Chronic Systolic CHF: Low salt diet. Monitor weights daily.weight is up, pulse elevated but syst BP low Lasix every other day. Tolerating therapy without issues 9. DM type 2:uncontrolled Monitor BS with achs checks.  -increase lantus to 25u qhs 10.  S/P pacemaker LOS (Days) 4 A FACE TO FACE EVALUATION  WAS PERFORMED  Dennis [name] 01/30/2015 6:50 AM

## 2015-01-30 NOTE — Progress Notes (Signed)
Recreational Therapy Session Note  Patient Details  Name: Dennis [name] MRN: [id number] Date of Birth: 10-Nov-1960 Today's Date: 01/30/2015  Pain: no c/o Skilled Therapeutic Interventions/Progress Updates: Co-treat with PT while pt exercising on Nu-step.  While seated on Nu-step continued leisure interest assessment as pt was falling asleep throughout leisure screen yesterday.  Pt required verbal cues for dual tasking throughout session ~every 30 seconds.  Pt with low BP at end of session, but asymptomatic.  Transitioned to exercises lying down on the mat table for remainder of session with PT.   SIMPSON,LISA 01/30/2015, 3:54 PM

## 2015-01-30 NOTE — Progress Notes (Signed)
Physical Therapy Session Note  Patient Details  Name: Dennis [name] MRN: [id number] Date of Birth: [birth year]  Today's Date: 01/30/2015 PT Individual Time: 1425-1535 PT Individual Time Calculation (min): 70 min   Short Term Goals: Week 1:  PT Short Term Goal 1 (Week 1): Pt will demonstrate sit to stand with SBA with LRAD.  PT Short Term Goal 2 (Week 1): Pt will demonstrate midline positioning when lying in bed (instead of leaning to R side) at rest.  PT Short Term Goal 3 (Week 1): Pt will initiate ambulation with PT when blood pressure is stable.  PT Short Term Goal 4 (Week 1): Pt will initiate w/c propulsion with PT, as activity tolerance/upright tolerance improves.  PT Short Term Goal 5 (Week 1): Pt will initiate stair training with PT as blood pressure is stable.   Skilled Therapeutic Interventions/Progress Updates: Tx focused on functional mobility training, stairs, gait with/without RW, and NMR via forced use, manual facilitation, and multi-modal cues for L hip and LUE activation. Pt continues to demonstrate non-symptomatic low BP and decreased sustained attention, needing increased processing time during functional tasks.   Therapeutic activity Pt needed min-guard overall for transfers, S for bed mobility with increased time, safety cues.   NMR Static and dynamic sitting balance EOB with close S and safety cues, manual facilitation for trunk alignment.  Nustep x8 min with bil LEs and LUE in grip assist, multi-tasking for cognition with cues for maintaining pace and task. PT unable to achieve 60 spm, and stopped every 30 sec during conversation, cues to continue.  Supine bridging x10 with 5 sec holds with bil LEs Single leg bridging with LLE over edge of mat on 4" curb for terminal ext x10. Pt very challenged with this task, unable to execute motor plan.  Static standing with cues for even weight shifting 2x1mn with manual facilitation for L hip ext, trunk lengthening.  Stair training  x12 with up to Mod lowering assist with reciprocal pattern, safety cues for foot clearance.   Gait training with RW 2x125' with Min A overall for safety and hip ext in stance, cues for increased step with swing.  Gait 2x 50' without device and mirror for visual feedback and cues for L-sided ext in stance. Pt unable to adjust gait quality with cues. May be appropriate for lite gait pending vitals.   Pt left in bed with alarm on and all needs in reach.       Therapy Documentation Precautions:  Precautions Precautions: Fall Precaution Comments: L side hemiplegia & neglect Restrictions Weight Bearing Restrictions: No    Vital Signs: Therapy Vitals Temp: 97.5 F (36.4 C) Temp Source: Oral Pulse Rate: 95 Resp: 17 BP: 94/73 mmHg Patient Position (if appropriate): Lying Oxygen Therapy SpO2: 95 % O2 Device: Not Delivered Pain: none  See FIM for current functional status  Therapy/Group: Individual Therapy and co-treat with recreation therapy   KSoundra Pilon4/19/2016, 2:48 PM

## 2015-01-30 NOTE — Progress Notes (Signed)
Occupational Therapy Session Note  Patient Details  Name: Dennis [name] MRN: [id number] Date of Birth: 03/17/1961  Today's Date: 01/30/2015 OT Individual Time: 1001-1101 OT Individual Time Calculation (min): 60 min    Short Term Goals: Week 1:  OT Short Term Goal 1 (Week 1): STG=LTG  Skilled Therapeutic Interventions/Progress Updates:    Pt performed neuromuscular re-education for the LUE during session.  Had him ambulate to the therapy gym with min facilitation.  He continues to demonstrate decreased left hip stability and left trunk flexion during mobility.  He can improve this with mod facilitation on the rib cage.  Once in the gym worked in quadriped with emphasis on LUE weightbearing.  He currently is only able to maintain the position for short periods before fatiguing and losing selective attention.  He is able to reach for objects with the RUE while maintaining left elbow extension.  Min facilitation and mod instructional cueing to maintain trunk and head alignment.  Transitioned to sitting position on the EOM and incorporated bilateral UE use and functional reach while performing pipe tree puzzle.  Mod instructional cueing needed to use the LUE for reaching to retrieve and place pieces.  Mod facilitation needed as well to help with reaching and avoid shoulder hike.  Pt still with moderate motor apraxia when attempting to incorporate the LUE into function.  Pt reporting increased posterior neck pain.  Noted that he demonstrates limited cervical extension secondary to increased protraction.  He tends to keep his head flexed at rest.  Returned to room at end of session with pt placed in recliner with safety belt in place.   Therapy Documentation Precautions:  Precautions Precautions: Fall Precaution Comments: L side hemiplegia & neglect Restrictions Weight Bearing Restrictions: No  Pain: Pain Assessment Pain Assessment: No/denies pain ADL: See FIM for current functional  status  Therapy/Group: Individual Therapy  MCGUIRE,JAMES OTR/L 01/30/2015, 12:44 PM

## 2015-01-30 NOTE — Progress Notes (Signed)
ANTICOAGULATION CONSULT NOTE - Follow Up Consult  Pharmacy Consult for coumadin Indication: left ventricular noncompaction  No Known Allergies  Patient Measurements: Height: 6' (182.9 cm) Weight: 151 lb 10.8 oz (68.8 kg) IBW/kg (Calculated) : 77.6 Heparin Dosing Weight:   Vital Signs: Temp: 97.5 F (36.4 C) (04/19 0602) Temp Source: Oral (04/19 0602) BP: 97/61 mmHg (04/19 0602) Pulse Rate: 99 (04/19 0602)  Labs:  Recent Labs  01/27/15 0817 01/28/15 0753 01/29/15 0555  LABPROT 19.6* 19.7* 22.1*  INR 1.65* 1.65* 1.91*  CREATININE  --   --  0.99    Estimated Creatinine Clearance: 84.9 mL/min (by C-G formula based on Cr of 0.99).   Medications:  Scheduled:  . aspirin EC  81 mg Oral Daily  . atorvastatin  20 mg Oral q1800  . [START ON 01/31/2015] furosemide  20 mg Oral Q48H  . insulin aspart  0-20 Units Subcutaneous TID WC  . insulin aspart  5 Units Subcutaneous TID WC  . insulin glargine  20 Units Subcutaneous QHS  . senna-docusate  1 tablet Oral BID  . Warfarin - Physician Dosing Inpatient   Does not apply q1800   Infusions:    Assessment: 54 yo male with left ventricular noncompaction is currently on therapeutic coumadin.  INR today is up to 2.4. Goal of Therapy:  INR 2-3 Monitor platelets by anticoagulation protocol: Yes   Plan:  Coumadin 7.5 mg today Daily PT/INR Stop ASA since INR >2 per Neuro  So, Tsz-Yin 01/30/2015,8:10 AM

## 2015-01-31 ENCOUNTER — Inpatient Hospital Stay (HOSPITAL_COMMUNITY): Payer: Medicare Other

## 2015-01-31 ENCOUNTER — Ambulatory Visit (HOSPITAL_COMMUNITY): Payer: Self-pay

## 2015-01-31 ENCOUNTER — Inpatient Hospital Stay (HOSPITAL_COMMUNITY): Payer: Medicare Other | Admitting: Speech Pathology

## 2015-01-31 ENCOUNTER — Inpatient Hospital Stay (HOSPITAL_COMMUNITY): Payer: Medicare Other | Admitting: Occupational Therapy

## 2015-01-31 DIAGNOSIS — I639 Cerebral infarction, unspecified: Secondary | ICD-10-CM

## 2015-01-31 DIAGNOSIS — I2510 Atherosclerotic heart disease of native coronary artery without angina pectoris: Secondary | ICD-10-CM

## 2015-01-31 LAB — BASIC METABOLIC PANEL
Anion gap: 8 (ref 5–15)
Anion gap: 6 (ref 5–15)
BUN: 15 mg/dL (ref 6–23)
BUN: 14 mg/dL (ref 6–23)
CO2: 24 mmol/L (ref 19–32)
CO2: 26 mmol/L (ref 19–32)
Calcium: 8.7 mg/dL (ref 8.4–10.5)
Calcium: 8.9 mg/dL (ref 8.4–10.5)
Chloride: 99 mmol/L (ref 96–112)
Chloride: 104 mmol/L (ref 96–112)
Creatinine, Ser: 1.03 mg/dL (ref 0.50–1.35)
Creatinine, Ser: 0.87 mg/dL (ref 0.50–1.35)
GFR calc Af Amer: >90 mL/min (ref >90)
GFR calc Af Amer: >90 mL/min (ref >90)
GFR calc non Af Amer: 82 mL/min — ABNORMAL LOW (ref >90)
GFR calc non Af Amer: >90 mL/min (ref >90)
Glucose, Bld: 309 mg/dL — ABNORMAL HIGH (ref 70–99)
Glucose, Bld: 173 mg/dL — ABNORMAL HIGH (ref 70–99)
Potassium: 5.6 mmol/L — ABNORMAL HIGH (ref 3.5–5.1)
Potassium: 4 mmol/L (ref 3.5–5.1)
Sodium: 131 mmol/L — ABNORMAL LOW (ref 135–145)
Sodium: 136 mmol/L (ref 135–145)

## 2015-01-31 LAB — GLUCOSE, CAPILLARY
Glucose-Capillary: 254 mg/dL — ABNORMAL HIGH (ref 70–99)
Glucose-Capillary: 179 mg/dL — ABNORMAL HIGH (ref 70–99)
Glucose-Capillary: 161 mg/dL — ABNORMAL HIGH (ref 70–99)
Glucose-Capillary: 102 mg/dL — ABNORMAL HIGH (ref 70–99)

## 2015-01-31 LAB — PROTIME-INR
INR: 2.78 — ABNORMAL HIGH (ref 0.00–1.49)
Prothrombin Time: 29.6 seconds — ABNORMAL HIGH (ref 11.6–15.2)

## 2015-01-31 IMAGING — DX DG CHEST 2V
3 series · 3 of 3 positions shown · non-contrast
Comparison: [DATE]

CLINICAL DATA: Shortness of breath, left-sided weakness. Stroke
glass [REDACTED].

EXAM:
CHEST  2 VIEW

[chest lat (1 of 2)]
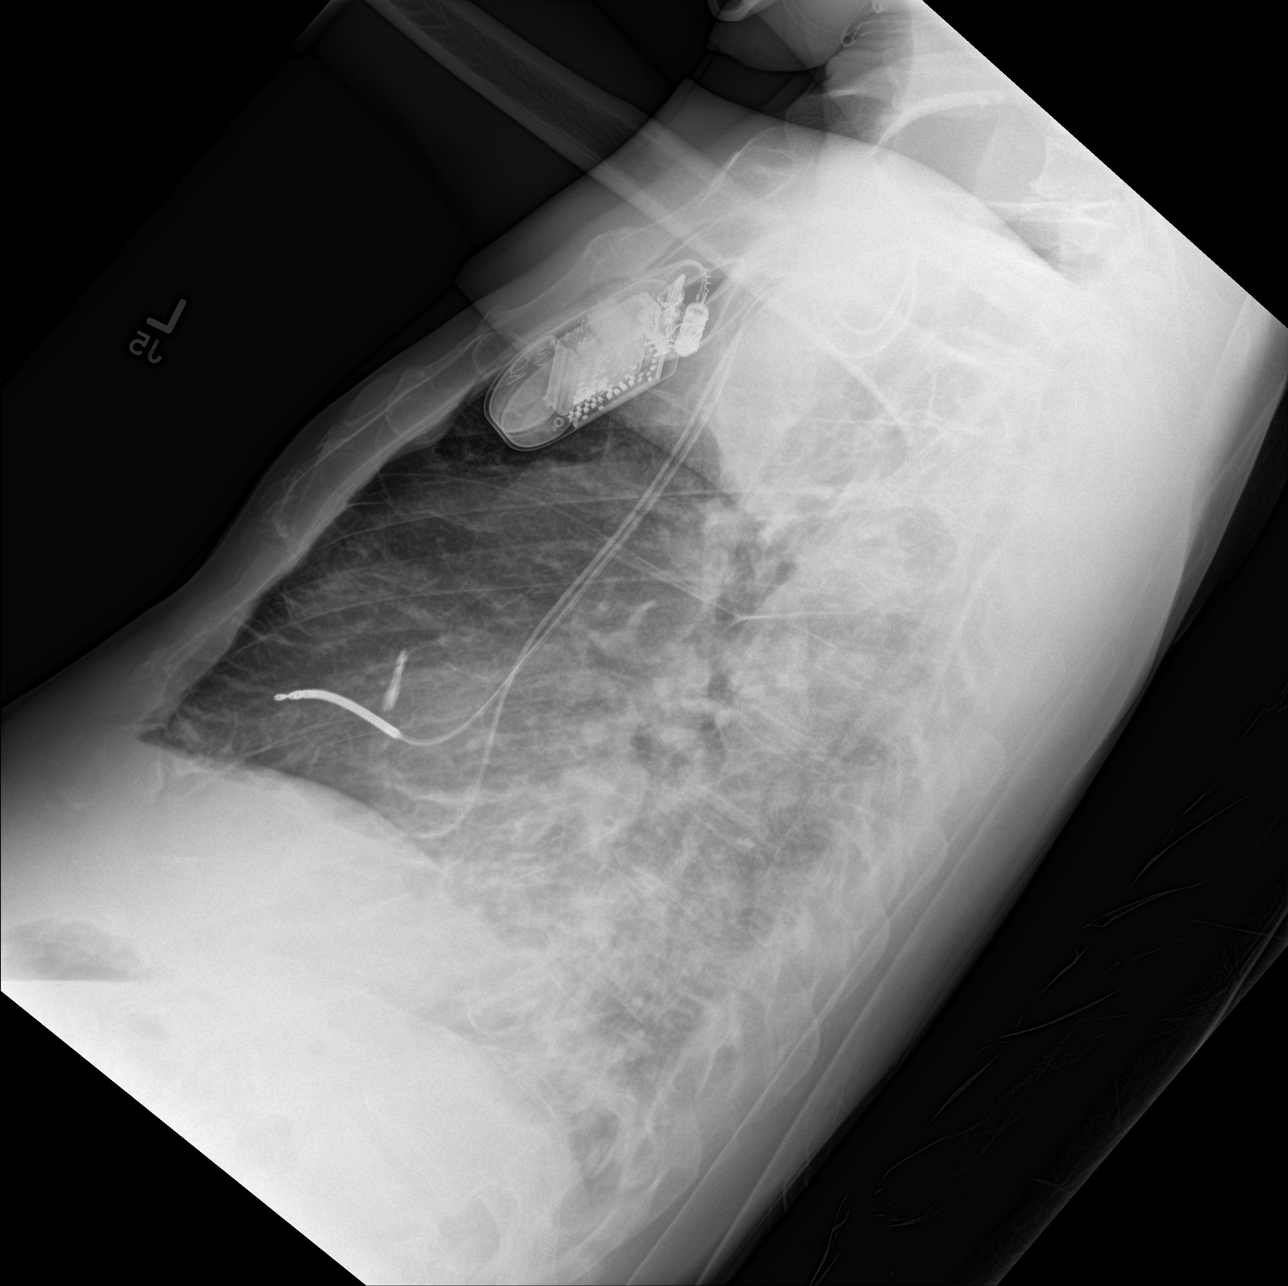

[chest ap]
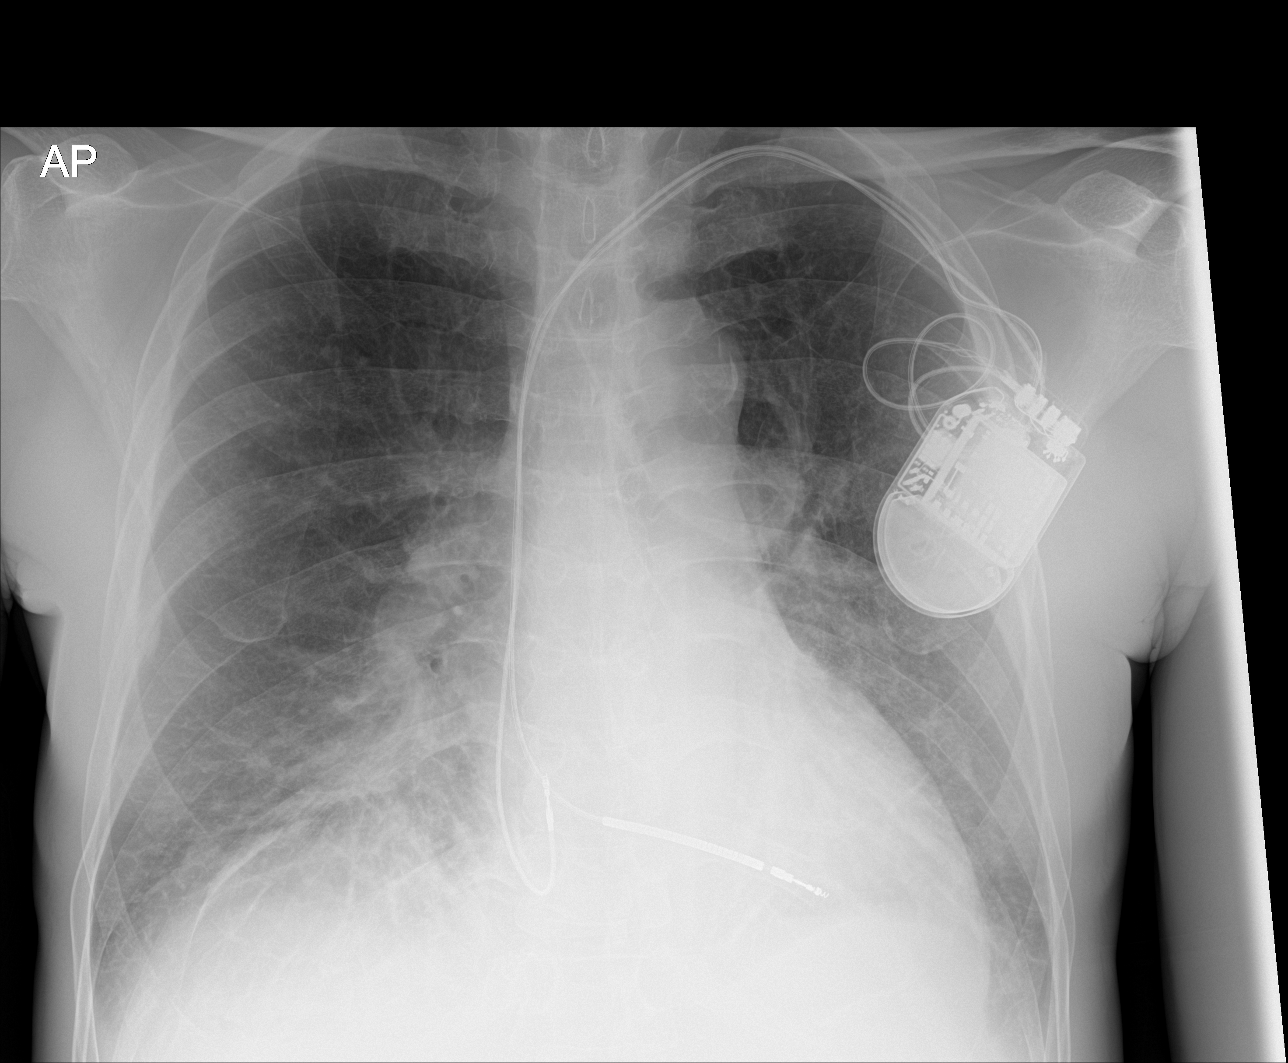

[chest lat (2 of 2)]
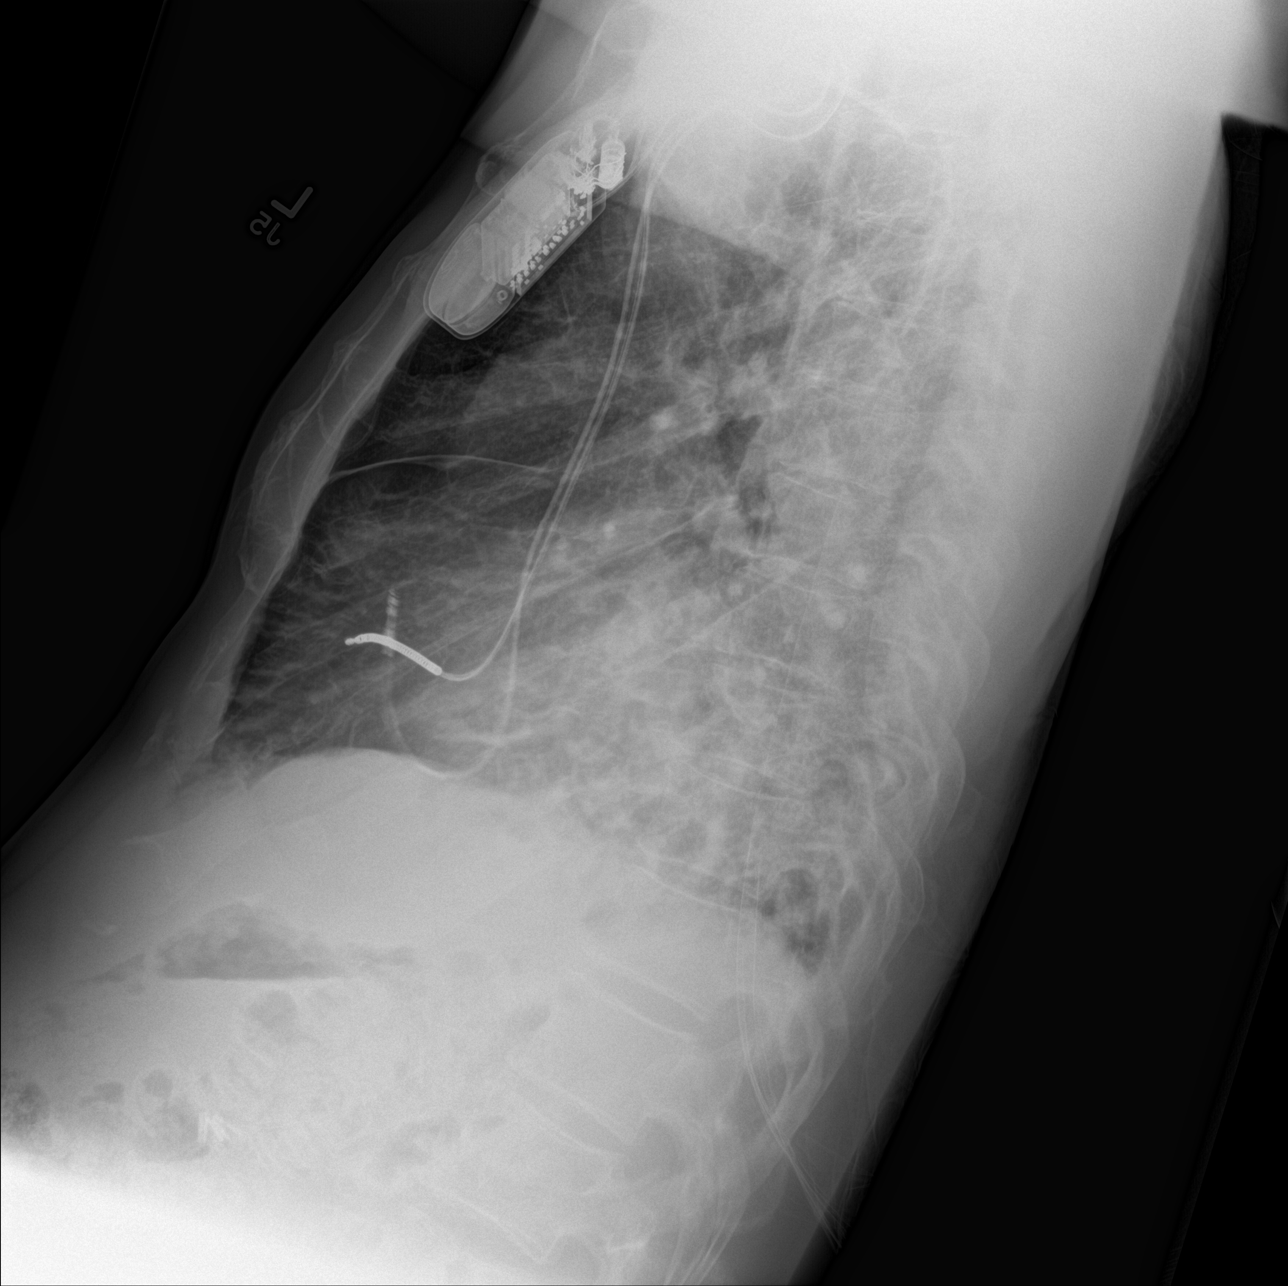

[3 of 3 positions shown; findings below may reference images not displayed]

FINDINGS: Left AICD remains in place, unchanged. Heart is upper limits normal
in size. New airspace opacities in the perihilar regions and lower
lobes, right slightly greater than left. This presumably represents
edema although infection cannot be excluded. No effusions. No acute
bony abnormality.
IMPRESSION: Bilateral perihilar and lower lobe airspace opacities, most likely
edema. Recommend clinical correlation to exclude pneumonia.

## 2015-01-31 MED ORDER — SORBITOL 70 % SOLN
45.0000 mL | Freq: Once | Status: AC
  Administered 2015-01-31: 45 mL via ORAL
  Filled 2015-01-31: qty 60

## 2015-01-31 MED ORDER — INSULIN GLARGINE 100 UNIT/ML ~~LOC~~ SOLN
25.0000 [IU] | Freq: Every day | SUBCUTANEOUS | Status: DC
  Administered 2015-01-31: 25 [IU] via SUBCUTANEOUS
  Filled 2015-01-31 (×2): qty 0.25

## 2015-01-31 MED ORDER — SPIRONOLACTONE 12.5 MG HALF TABLET
12.5000 mg | ORAL_TABLET | Freq: Every day | ORAL | Status: DC
Start: 2015-01-31 — End: 2015-02-01
  Administered 2015-01-31: 12.5 mg via ORAL
  Filled 2015-01-31 (×3): qty 1

## 2015-01-31 MED ORDER — DIGOXIN 250 MCG PO TABS
0.2500 mg | ORAL_TABLET | Freq: Every day | ORAL | Status: DC
Start: 2015-01-31 — End: 2015-02-01
  Administered 2015-01-31 – 2015-02-01 (×2): 0.25 mg via ORAL
  Filled 2015-01-31 (×3): qty 1

## 2015-01-31 MED ORDER — ALPRAZOLAM 0.25 MG PO TABS: 0.2500 mg | ORAL_TABLET | Freq: Three times a day (TID) | ORAL | Status: DC | PRN

## 2015-01-31 MED ORDER — GLUCERNA SHAKE PO LIQD
237.0000 mL | Freq: Two times a day (BID) | ORAL | Status: DC
  Administered 2015-02-01: 237 mL via ORAL

## 2015-01-31 MED ORDER — FUROSEMIDE 10 MG/ML IJ SOLN
40.0000 mg | Freq: Two times a day (BID) | INTRAMUSCULAR | Status: AC
  Administered 2015-01-31 (×2): 40 mg via INTRAVENOUS
  Filled 2015-01-31 (×2): qty 4

## 2015-01-31 MED ORDER — WARFARIN SODIUM 7.5 MG PO TABS
7.5000 mg | ORAL_TABLET | Freq: Once | ORAL | Status: AC
  Administered 2015-01-31: 7.5 mg via ORAL
  Filled 2015-01-31: qty 1

## 2015-01-31 NOTE — Progress Notes (Signed)
Speech Language Pathology Daily Session Note  Patient Details  Name: Dennis [name] MRN: [id number] Date of Birth: 07-Sep-1961  Today's Date: 01/31/2015 SLP Individual Time: 1330-1400 SLP Individual Time Calculation (min): 30 min  Short Term Goals: Week 1: SLP Short Term Goal 1 (Week 1): Patient will consume current diet without overt s/s of aspiration with Mod I for use of swallow strategies.  SLP Short Term Goal 1 - Progress (Week 1): Progressing toward goal SLP Short Term Goal 2 (Week 1): Patient will demonstrate complex problem solving for functional and familiar tasks with supervision verbal cues.  SLP Short Term Goal 2 - Progress (Week 1): Progressing toward goal SLP Short Term Goal 3 (Week 1): Patient will recall new, daily information with use  of external aids with supervision verbal cues.  SLP Short Term Goal 3 - Progress (Week 1): Progressing toward goal SLP Short Term Goal 4 (Week 1): Patient will identify 2 physical and 2 cognitive deficits with supervision question cues.  SLP Short Term Goal 4 - Progress (Week 1): Progressing toward goal SLP Short Term Goal 5 (Week 1): Patient will utilize call bell to request assistance with supervision verbal cues.  SLP Short Term Goal 5 - Progress (Week 1): Progressing toward goal SLP Short Term Goal 6 (Week 1): Patient will demonstrate selective attention to tasks for 30 minutes with supervision cues for redirection.  SLP Short Term Goal 6 - Progress (Week 1): Progressing toward goal  Skilled Therapeutic Interventions: Pt was seen for skilled ST targeting cognitive goals. Upon arrival of SLP, pt was asleep in bed, but awakened easily with verbal stim. Mother present. Pt sat at EOB to maximize attention and alertness during structured therapeutic tasks. While upright, pt was observed eating chips. No overt s/s aspiration observed or reported. Pt was able to verbalize level of function prior to admit, and was able to indicate physical deficits  resulting from CVA. Re: cognitive deficits, pt reported requiring additional processing time in functional situations. Pt indicated increase pain in right shoulder after movement, and independently used call bell to summon RN for pain meds. No overt s/s aspiration with meds whole with liquid. Pt repositioned himself in bed with min assist, and resumed attention to therapy task without need for redirection. Pt able to read and answer questions about medication label without cues or assistance.    FIM:  Comprehension Comprehension Mode: Auditory Comprehension: 5-Understands complex 90% of the time/Cues < 10% of the time Expression Expression Mode: Verbal Expression: 5-Expresses complex 90% of the time/cues < 10% of the time Social Interaction Social Interaction: 4-Interacts appropriately 75 - 89% of the time - Needs redirection for appropriate language or to initiate interaction. Problem Solving Problem Solving: 4-Solves basic 75 - 89% of the time/requires cueing 10 - 24% of the time Memory Memory: 4-Recognizes or recalls 75 - 89% of the time/requires cueing 10 - 24% of the time FIM - Eating Eating Activity: 5: Set-up assist for open containers  Pain Pain Assessment Pain Assessment: 0-10 Pain Score: 8  Pain Type: Acute pain Pain Location: Shoulder Pain Orientation: Right Pain Descriptors / Indicators: Throbbing Pain Frequency: Intermittent Pain Onset: Gradual Patients Stated Pain Goal: 2 Pain Intervention(s): RN made aware;Repositioned Multiple Pain Sites: No  Therapy/Group: Individual Therapy   Celia B. [place], [facility], [place]  Shonna Chock 01/31/2015, 2:40 PM

## 2015-01-31 NOTE — Progress Notes (Signed)
Pts BP is 89/69 manually. Danella Sensing notified, and ordered to hold IV lasix scheduled for tonight. Pts lung sounds clear. No signs of distress, and no complaints. Will continue to monitor. Kennieth Francois, RN

## 2015-01-31 NOTE — Consult Note (Signed)
Advanced Heart Failure Team Consult Note  Referring Physician: Dr Letta Pate Primary Physician: Primary Cardiologist:  Dr Haroldine Laws  Reason for Consultation: Heart Failure   HPI:   The HF Team was asked to consult for increased dyspnea and tachycardia by Dr Letta Pate. Prior to admit he had been followed in the HF clinic and was last seen 12/01/2014 and he was stable.   54 y/o male with h/o DM2, polysubstance use (tobacco, cocaine), mixed ICM/NICM and chronic systolic HF due to ventricular noncompaction- on chronic coumadin, noncompliance admitted to [facility] with L sided weakness. Neurology consulted. CT showed R MCA stroke. Did not receive TPA due to delay in arrival. Had ECHO that showed EF 10-15% mild RV HK. No obvious LV clot. As he improved he was transferred to CIR.   Earlier this am had increased dyspnea and tachycardia. Offered antianxiety meds however he refused. CXR ordered = pulmonary edema. BMET this am K 5.6, Creatinine 1.03, and WBC 9.6. EKG - Sinus Tach 112 bpm. Over the last 5 days weight trending up 141 to 157 pounds. He has been getting 20 mg po lasix.   Denies SOB.   [place] 09/21/14  RA = 2 RV = 19/2/3 PA = 22/14 (18) PCW = 6 Fick cardiac output/index = 5.2/2.7 PVR = 2.3 WU FA sat = 93% PA sat = 60%, 64%  Review of Systems: [y] = yes, '[ ]'$  = no   General: Weight gain [Y ]; Weight loss '[ ]'$ ; Anorexia '[ ]'$ ; Fatigue [Y ]; Fever '[ ]'$ ; Chills '[ ]'$ ; Weakness [Y ]  Cardiac: Chest pain/pressure '[ ]'$ ; Resting SOB [Y ]; Exertional SOB [ Y]; Orthopnea [ Y]; Pedal Edema '[ ]'$ ; Palpitations '[ ]'$ ; Syncope '[ ]'$ ; Presyncope '[ ]'$ ; Paroxysmal nocturnal dyspnea[Y ]  Pulmonary: Cough [Y ]; Wheezing'[ ]'$ ; Hemoptysis'[ ]'$ ; Sputum '[ ]'$ ; Snoring '[ ]'$   GI: Vomiting'[ ]'$ ; Dysphagia'[ ]'$ ; Melena'[ ]'$ ; Hematochezia '[ ]'$ ; Heartburn'[ ]'$ ; Abdominal pain '[ ]'$ ; Constipation '[ ]'$ ; Diarrhea '[ ]'$ ; BRBPR '[ ]'$   GU: Hematuria'[ ]'$ ; Dysuria '[ ]'$ ; Nocturia'[ ]'$   Vascular: Pain in legs with walking '[ ]'$ ; Pain in feet with lying flat '[ ]'$ ;  Non-healing sores '[ ]'$ ; Stroke [Y ]; TIA '[ ]'$ ; Slurred speech '[ ]'$ ;  Neuro: Headaches'[ ]'$ ; Vertigo'[ ]'$ ; Seizures'[ ]'$ ; Paresthesias'[ ]'$ ;Blurred vision '[ ]'$ ; Diplopia '[ ]'$ ; Vision changes '[ ]'$   Ortho/Skin: Arthritis '[ ]'$ ; Joint pain [Y ]; Muscle pain '[ ]'$ ; Joint swelling '[ ]'$ ; Back Pain '[ ]'$ ; Rash '[ ]'$   Psych: Depression'[ ]'$ ; Anxiety'[ ]'$   Heme: Bleeding problems '[ ]'$ ; Clotting disorders '[ ]'$ ; Anemia '[ ]'$   Endocrine: Diabetes [ Y]; Thyroid dysfunction'[ ]'$   Home Medications Prior to Admission medications   Medication Sig Start Date End Date Taking? Authorizing Provider  acetaminophen (TYLENOL) 500 MG tablet Take 1,000 mg by mouth every 6 (six) hours as needed for moderate pain.    Historical Provider, MD  aspirin EC 81 MG EC tablet Take 1 tablet (81 mg total) by mouth daily. Stop when INR reaches 2 01/26/15   Thurnell Lose, MD  atorvastatin (LIPITOR) 80 MG tablet Take 1 tablet (80 mg total) by mouth daily at 6 PM. 01/26/15   Thurnell Lose, MD  carvedilol (COREG) 3.125 MG tablet Take 1 tablet (3.125 mg total) by mouth 2 (two) times daily with a meal. Hold if systolic blood pressure less than 90 01/26/15   Thurnell Lose, MD  digoxin (LANOXIN) 0.125 MG tablet Take 1 tablet (0.125 mg total)  by mouth daily. 11/16/14   Larey Dresser, MD  furosemide (LASIX) 40 MG tablet Take 1 tablet (40 mg total) by mouth every other day. 12/01/14   Amy D Ninfa Meeker, NP  glucose blood (ONETOUCH VERIO) test strip Use as instructed 11/30/14   [place], FNP  insulin aspart (NOVOLOG) 100 UNIT/ML injection Before each meal 3 times a day, 140-199 - 2 units, 200-250 - 4 units, 251-299 - 6 units,  300-349 - 8 units,  350 or above 10 units. Dispense syringes and needles as needed, Ok to switch to PEN if approved. 01/26/15   Thurnell Lose, MD  Insulin Glargine (LANTUS) 100 UNIT/ML Solostar Pen Inject 25 Units into the skin daily at 10 pm. 01/26/15   Thurnell Lose, MD  QC PEN NEEDLES 31G X 6 MM MISC 1 each by Other route See admin  instructions. Use daily with insulin. 08/06/12   Historical Provider, MD  traMADol (ULTRAM) 50 MG tablet Take 1 tablet (50 mg total) by mouth every 12 (twelve) hours as needed for moderate pain. 09/25/14   Conrad Hobson, NP    Past Medical History: Past Medical History  Diagnosis Date  . Chronic systolic heart failure     a) Mixed ICM/NICM b) RHC (05/2014): RA 2, RV 19/2/3, PA 22/14 (18), PCWP 6, Fick CO/CI: 5.2 / 2.7, PVR 2.3 WU, PA 60% and 64% c) ECHO (05/2014): EF 20-25%, diff HK, akinesis entireanteroseptal myocardium, triv AI, mod MR, LA mod/sev dilated  . Automatic implantable cardioverter-defibrillator in situ   . Heart murmur   . Sleep apnea     "cleared after T&A"  . Left ventricular noncompaction   . Ischemic cardiomyopathy     a) Coronary angiography (12/2008) at [facility]: Lmain: nl, LAD mid 100% stenosis with left to left and right-to-left collaterals to the distal LAD; Lcx: nl, RCA nl.    . Type II diabetes mellitus   . High cholesterol   . Pneumonia 1988  . Drug abuse and dependence   . CHF (congestive heart failure)     Past Surgical History: Past Surgical History  Procedure Laterality Date  . Forearm fracture surgery Left 1980  . Cardiac defibrillator placement  03/2011  . Cholecystectomy  1994  . Right heart catheterization N/A 05/30/2014    Procedure: RIGHT HEART CATH;  Surgeon: Jolaine Artist, MD;  Location: [facility] CATH LAB;  Service: Cardiovascular;  Laterality: N/A;  . Fracture surgery    . Cardiac catheterization  05/2014  . Tonsillectomy and adenoidectomy  ~ 1998    Family History: Family History  Problem Relation Age of Onset  . Diabetes Mother   . Heart disease Mother   . Diabetes Father   . Prostate cancer Father   . Heart disease Father   . Diabetes Brother   . Diabetes Brother     Social History: History   Social History  . Marital Status: Single    Spouse Name: N/A  . Number of Children: 1  . Years of Education: 14   Occupational History  .  Disabled    Social History Main Topics  . Smoking status: Current Some Day Smoker -- 0.10 packs/day for 31 years    Types: Cigarettes  . Smokeless tobacco: Never Used  . Alcohol Use: 0.0 oz/week    0 Standard drinks or equivalent per week     Comment: 09/21/2014 "might have a drink q 6 /months, if that"  . Drug Use: No  Comment: 09/21/2014 "last cocaine ~ 1 1/2 months ago"  . Sexual Activity: Yes   Other Topics Concern  . Not on file   Social History Narrative   Lives in [place] with his mother. No pets. Fun: movies, sports, daughter.    Denies religious beliefs that would effect health care.     Allergies:  No Known Allergies  Objective:    Vital Signs:   Temp:  [97.5 F (36.4 C)-97.6 F (36.4 C)] 97.6 F (36.4 C) (04/20 0437) Pulse Rate:  [95-118] 118 (04/20 0437) Resp:  [17-18] 18 (04/20 0437) BP: (87-103)/(64-75) 103/75 mmHg (04/20 0437) SpO2:  [94 %-97 %] 95 % (04/20 0437) Weight:  [157 lb (71.215 kg)] 157 lb (71.215 kg) (04/20 0437) Last BM Date: 01/30/15  Weight change: Filed Weights   01/29/15 0537 01/30/15 0500 01/31/15 0437  Weight: 144 lb 13.5 oz (65.7 kg) 151 lb 10.8 oz (68.8 kg) 157 lb (71.215 kg)    Intake/Output:   Intake/Output Summary (Last 24 hours) at 01/31/15 1610 Last data filed at 01/31/15 0800  Gross per 24 hour  Intake    840 ml  Output   1325 ml  Net   -485 ml     Physical Exam: General:  Fatigued appearing. No resp difficulty HEENT: normal Neck: supple. JVP ~9   Carotids 2+ bilat; no bruits. No lymphadenopathy or thryomegaly appreciated. Cor: PMI laterally displaced. Tachy Regular rate & rhythm. +s3 2/6 MR Lungs: clear Abdomen: soft, nontender, mildly distended. No hepatosplenomegaly. No bruits or masses. Good bowel sounds. Extremities: no cyanosis, clubbing, rash, edema. LUE weak  Neuro: alert & orientedx3, cranial nerves grossly intact. Affect flat    Labs: Basic Metabolic Panel:  Recent Labs Lab 01/26/15 0628  01/27/15 0647 01/29/15 0555 01/30/15 0800 01/31/15 0809  NA 135 134* 134* 135 131*  K 3.9 4.5 4.3 4.2 5.6*  CL 99 99 101 103 99  CO2 '26 24 25 26 24  '$ GLUCOSE 249* 342* 326* 279* 309*  BUN '15 16 21 17 15  '$ CREATININE 1.08 1.04 0.99 0.95 PENDING  CALCIUM 9.0 8.6 8.5 8.6 8.7    Liver Function Tests:  Recent Labs Lab 01/24/15 1325 01/27/15 0647  AST 18 21  ALT 18 18  ALKPHOS 99 85  BILITOT 1.0 1.1  PROT 6.3 5.6*  ALBUMIN 3.3* 2.8*   No results for input(s): LIPASE, AMYLASE in the last 168 hours. No results for input(s): AMMONIA in the last 168 hours.  CBC:  Recent Labs Lab 01/24/15 1337 01/24/15 1359 01/27/15 0647 01/30/15 1839  WBC  --  9.4 10.5 9.6  NEUTROABS  --  6.8 6.4 6.2  HGB 17.7* 15.5 13.7 12.0*  HCT 52.0 46.1 42.4 37.4*  MCV  --  92.2 94.9 94.7  PLT  --  199 180 189    Cardiac Enzymes: No results for input(s): CKTOTAL, CKMB, CKMBINDEX, TROPONINI in the last 168 hours.  BNP: BNP (last 3 results)  Recent Labs  12/03/14 0351 01/24/15 2005  BNP 321.1* 166.1*    ProBNP (last 3 results)  Recent Labs  08/13/14 1850 09/21/14 1928 10/02/14 1033  PROBNP 1525.0* 1870.0* 1085.0*     CBG:  Recent Labs Lab 01/30/15 0710 01/30/15 1109 01/30/15 1616 01/30/15 2125 01/31/15 0646  GLUCAP 236* 89 139* 191* 254*    Coagulation Studies:  Recent Labs  01/29/15 0555 01/30/15 0800 01/31/15 0809  LABPROT 22.1* 26.4* 29.6*  INR 1.91* 2.40* 2.78*    Other results: EKG: Sinus Tach 112 bpm  QRS 83 ms  Imaging:  No results found.   Medications:     Current Medications: . atorvastatin  20 mg Oral q1800  . furosemide  20 mg Oral Q48H  . insulin aspart  0-20 Units Subcutaneous TID WC  . insulin aspart  5 Units Subcutaneous TID WC  . insulin glargine  25 Units Subcutaneous QHS  . senna-docusate  1 tablet Oral BID  . Warfarin - Physician Dosing Inpatient   Does not apply q1800     Infusions:      Assessment:  1. CVA, acute with  left-sided weakness 2. Dyspnea 3. Acute on Chronic Systolic Heart Failure- ECHO EF 10-15% with non compaction 4. CAD Coronary angiography (12/2008) with mid LAD totally occluded and collateralized. Continue statin and BB. No ASA he is on coumadin. 5. DMII    Plan/Discussion:    The HF team was asked to consult for increased dyspnea. Give 40 mg IV lasix now.    Length of Stay: 5  CLEGG,AMY NP-C  01/31/2015, 9:27 AM  Advanced Heart Failure Team Pager [phone] (M-F; [place])  Please contact [facility] for night-coverage after hours (4p -7a ) and weekends on [email or web address]  Patient seen and examined with Darrick Grinder, NP. I have reviewed echo and CXR image personally. We discussed all aspects of the encounter. I agree with the assessment and plan as stated above.   He has evidence of low output HF with moderate volume overload. Will start IV lasix, digoxin and spironolactone. Watch BP closely. If deteriorates we can transfer him for inotrope support. Would like to start low-dose hydral/nitrates soon as BP tolerates.   Continue coumadin and statin.  We will follow.   Daniel Bensimhon,MD 10:29 AM

## 2015-01-31 NOTE — Progress Notes (Signed)
Inpatient Diabetes Program Recommendations  AACE/ADA: New Consensus Statement on Inpatient Glycemic Control (2013)  Target Ranges:  Prepandial:   less than 140 mg/dL      Peak postprandial:   less than 180 mg/dL (1-2 hours)      Critically ill patients:  140 - 180 mg/dL   Results for RAVIS, HERNE (MRN [id number]) as of 01/31/2015 08:45  Ref. Range 01/30/2015 07:10 01/30/2015 11:09 01/30/2015 16:16 01/30/2015 21:25 01/31/2015 06:46  Glucose-Capillary Latest Ref Range: 70-99 mg/dL 236 (H) 89 139 (H) 191 (H) 254 (H)    Inpatient Diabetes Program  Insulin - Basal: Noted basal insulin increased to Lantus 25 units QHS. Patient to get dose tonight. Correction (SSI): As patient recieves more basal insulin, may have to decrease correction scale since patient seems very sensitive to large doses of Novolog (from 200's down to 80's).  Thanks,  Tama Headings RN, MSN, [facility] Inpatient Diabetes Coordinator Team Pager [phone]

## 2015-01-31 NOTE — Progress Notes (Signed)
INITIAL NUTRITION ASSESSMENT  Pt meets criteria for NON-SEVERE (MODERATE) MALNUTRITION in the context of chronic illness as evidenced by severe fat mass loss and moderate to severe muscle mass loss.  DOCUMENTATION CODES Per approved criteria  -Non-severe (moderate) malnutrition in the context of chronic illness   INTERVENTION: Provide Glucerna Shake po BID, each supplement provides 220 kcal and 10 grams of protein.  NUTRITION DIAGNOSIS: Increased nutrient needs related to therapy as evidenced by estimated nutrition needs.   Goal: Pt to meet >/= 90% of their estimated nutrition needs   Monitor:  PO intake, weight trends, labs, I/O's  Reason for Assessment: MD consult for assessment of nutrition requirements/status  54 y.o. male  Admitting Dx: Embolic stroke involving right middle cerebral artery  ASSESSMENT: Pt with history of chronic systolic HF, OSA, DM type 2, Left ventricular non-compaction on chronic coumadin, s/p ICD, medication non-compliance; who was found down by family with slurred speech, left facial droop and left sided weakness. CT head with early signs of R-MCA infarct.  Pt reports appetite has been fine currently and PTA at home eating 3 meals a day with no difficulties. Meal completion has been 75-100%. Per Epic weight records, weight has been stable, however pt reports he has been losing weight but is unable to quantify amount lost. Pt is agreeable to Glucerna Shake. RD to order. Family at bedside and is considered about the way the pt eats at home as pt is not counting carbohydrates. She was requesting education. Education given to both pt and family member.   Nutrition Focused Physical Exam:  Subcutaneous Fat:  Orbital Region: N/A Upper Arm Region: N/A Thoracic and Lumbar Region: Severe depletion  Muscle:  Temple Region: Moderate depletion Clavicle Bone Region: Moderate to severe depletion Clavicle and Acromion Bone Region: Moderate to severe  depletion Scapular Bone Region: N/A Dorsal Hand: N/A Patellar Region: WNL Anterior Thigh Region: Mild to moderate depletion Posterior Calf Region: WNL  Edema: none  Labs and medications reviewed.  Height: Ht Readings from Last 1 Encounters:  01/26/15 6' (1.829 m)    Weight: Wt Readings from Last 1 Encounters:  01/31/15 157 lb (71.215 kg)    Ideal Body Weight: 178 lbs  % Ideal Body Weight: 88%  Wt Readings from Last 10 Encounters:  01/31/15 157 lb (71.215 kg)  01/26/15 148 lb 3.2 oz (67.223 kg)  12/01/14 157 lb 6.4 oz (71.396 kg)  11/30/14 160 lb (72.576 kg)  11/28/14 155 lb 9.6 oz (70.58 kg)  09/25/14 152 lb 5.4 oz (69.1 kg)  09/21/14 162 lb 6 oz (73.653 kg)  06/01/14 153 lb 6.4 oz (69.582 kg)  08/10/13 153 lb 3.2 oz (69.491 kg)  09/15/12 169 lb (76.658 kg)    Usual Body Weight: 157 lbs per pt report  % Usual Body Weight: 100%  BMI:  Body mass index is 21.29 kg/(m^2).  Estimated Nutritional Needs: Kcal: 1900-2100 Protein: 95-110 grams Fluid: 1.9 - 2.1 L/day  Skin: Intact  Diet Order: Diet Carb Modified Fluid consistency:: Thin; Room service appropriate?: Yes  EDUCATION NEEDS: -Education needs addressed   Intake/Output Summary (Last 24 hours) at 01/31/15 1452 Last data filed at 01/31/15 1302  Gross per 24 hour  Intake    840 ml  Output   1625 ml  Net   -785 ml    Last BM: 4/19  Labs:   Recent Labs Lab 01/30/15 0800 01/31/15 0809 01/31/15 1055  NA 135 131* 136  K 4.2 5.6* 4.0  CL 103 99 104  CO2 '26 24 26  '$ BUN '17 15 14  '$ CREATININE 0.95 1.03 0.87  CALCIUM 8.6 8.7 8.9  GLUCOSE 279* 309* 173*    CBG (last 3)   Recent Labs  01/30/15 2125 01/31/15 0646 01/31/15 1218  GLUCAP 191* 254* 179*    Scheduled Meds: . atorvastatin  20 mg Oral q1800  . digoxin  0.25 mg Oral Daily  . furosemide  40 mg Intravenous BID  . furosemide  20 mg Oral Q48H  . insulin aspart  0-20 Units Subcutaneous TID WC  . insulin aspart  5 Units Subcutaneous  TID WC  . insulin glargine  25 Units Subcutaneous QHS  . senna-docusate  1 tablet Oral BID  . spironolactone  12.5 mg Oral Daily  . warfarin  7.5 mg Oral ONCE-1800  . Warfarin - Physician Dosing Inpatient   Does not apply q1800    Continuous Infusions:   Past Medical History  Diagnosis Date  . Chronic systolic heart failure     a) Mixed ICM/NICM b) RHC (05/2014): RA 2, RV 19/2/3, PA 22/14 (18), PCWP 6, Fick CO/CI: 5.2 / 2.7, PVR 2.3 WU, PA 60% and 64% c) ECHO (05/2014): EF 20-25%, diff HK, akinesis entireanteroseptal myocardium, triv AI, mod MR, LA mod/sev dilated  . Automatic implantable cardioverter-defibrillator in situ   . Heart murmur   . Sleep apnea     "cleared after T&A"  . Left ventricular noncompaction   . Ischemic cardiomyopathy     a) Coronary angiography (12/2008) at [facility]: Lmain: nl, LAD mid 100% stenosis with left to left and right-to-left collaterals to the distal LAD; Lcx: nl, RCA nl.    . Type II diabetes mellitus   . High cholesterol   . Pneumonia 1988  . Drug abuse and dependence   . CHF (congestive heart failure)     Past Surgical History  Procedure Laterality Date  . Forearm fracture surgery Left 1980  . Cardiac defibrillator placement  03/2011  . Cholecystectomy  1994  . Right heart catheterization N/A 05/30/2014    Procedure: RIGHT HEART CATH;  Surgeon: Jolaine Artist, MD;  Location: [facility] CATH LAB;  Service: Cardiovascular;  Laterality: N/A;  . Fracture surgery    . Cardiac catheterization  05/2014  . Tonsillectomy and adenoidectomy  ~ [address] MS, RD, LDN Pager # [phone] After hours/ weekend pager # [phone]

## 2015-01-31 NOTE — Plan of Care (Signed)
Problem: Food- and Nutrition-Related Knowledge Deficit (NB-1.1) Goal: Nutrition education Formal process to instruct or train a patient/client in a skill or to impart knowledge to help patients/clients voluntarily manage or modify food choices and eating behavior to maintain or improve health. Outcome: Completed/Met Date Met:  01/31/15  RD consulted for nutrition education regarding diabetes.     Lab Results  Component Value Date    HGBA1C 13.3* 01/24/2015    RD provided "Carbohydrate Counting for People with Diabetes" handout from the Academy of [employer] Dietetics. Family member at pt bedside. Discussed different food groups and their effects on blood sugar, emphasizing carbohydrate-containing foods. Provided list of carbohydrates and recommended serving sizes of common foods.  Discussed importance of controlled and consistent carbohydrate intake throughout the day. Recommended 4-5 servings of carbohydrates at each meal and 1-2 carbohydrate servings at snack. Provided examples of ways to balance meals/snacks and encouraged intake of high-fiber, whole grain complex carbohydrates. Discussed diabetic friendly drink options. Teach back method used.  Expect good compliance.  Kallie Locks, MS, RD, LDN Pager # [phone] After hours/ weekend pager # [phone]

## 2015-01-31 NOTE — Progress Notes (Signed)
Social Work Patient ID: Dennis [name], male   DOB: 03-Aug-1961, 54 y.o.   MRN: [id number] Met with pt and Mom who was here to inform of team conference goals-supervision/mod/i level and discharge 4/28.  He is not feeling well today didn't sleep last  Night and is having trouble breathing.  MD is aware ans is addressing this. HF team has been in also and evaluated pt. Mom states: " You make sure you tell him to do What he is told and to take his medicines." Will continue to work on discharge plans.

## 2015-01-31 NOTE — Progress Notes (Addendum)
[facility]     PROGRESS NOTE    Subjective/Complaints: Problem breathing last noc, states he keeps HOB elevated.    States BPs running low since November.  Dr Sung Amabile is heart Dr. denies leg swelling   Review of Systems - Negative except left side weak   Objective: Vital Signs: Blood pressure 103/75, pulse 118, temperature 97.6 F (36.4 C), temperature source Oral, resp. rate 18, height 6' (1.829 m), weight 71.215 kg (157 lb), SpO2 95 %. No results found.  Recent Labs  01/30/15 1839  WBC 9.6  HGB 12.0*  HCT 37.4*  PLT 189    Recent Labs  01/29/15 0555 01/30/15 0800  NA 134* 135  K 4.3 4.2  CL 101 103  GLUCOSE 326* 279*  BUN 21 17  CREATININE 0.99 0.95  CALCIUM 8.5 8.6   CBG (last 3)   Recent Labs  01/30/15 1616 01/30/15 2125 01/31/15 0646  GLUCAP 139* 191* 254*    Wt Readings from Last 3 Encounters:  01/31/15 71.215 kg (157 lb)  01/26/15 67.223 kg (148 lb 3.2 oz)  12/01/14 71.396 kg (157 lb 6.4 oz)    Physical Exam:  Constitutional: He appears well-developed and well-nourished. No distress.  HENT: oral mucosa pink/dentition poor Head: Normocephalic and atraumatic.  Ext - C/C/E Eyes: Conjunctivae and EOM are normal. Pupils are equal, round, and reactive to light.  Neck: No tracheal deviation present. No thyromegaly present.  Cardiovascular: Normal rate and regular rhythm. Exam reveals no gallop and no friction rub.  No murmur heard. Respiratory: No respiratory distress. He has no wheezes. He has no rales.  GI: Soft. Bowel sounds are normal. He exhibits no distension. There is no tenderness.  Neurological: left facial droop Motor strength is 5/5 in the Right deltoid biceps triceps grip hip flexor and knee extensor ankle dorsiflexor Left upper extremity 4- to 4/5 in the deltoid, biceps, triceps, grip, hip flexor, knee extensor, ankle dorsiflexor and plantar flexor---inconsistent effort Sensation intact to  light touch in bilateral upper and lower limbs\ mild left pronator drift  Psych:mildly anxious    Assessment/Plan: 1. Functional deficits secondary to right MCA infarct which require 3+ hours per day of interdisciplinary therapy in a comprehensive inpatient rehab setting. Physiatrist is providing close team supervision and 24 hour management of active medical problems listed below. Physiatrist and rehab team continue to assess barriers to discharge/monitor patient progress toward functional and medical goals. Keep HR<120 during exercise  EKG today looks similar to EKG on 4/14, Q waves in I, aVL, V5 and V6 unchanged, rate is higher FIM: FIM - Bathing Bathing Steps Patient Completed: Chest, Left Arm, Abdomen, Front perineal area, Buttocks, Right upper leg, Left upper leg, Right lower leg (including foot) Bathing: 4: Min-Patient completes 8-9 [id number] parts or 75+ percent  FIM - Upper Body Dressing/Undressing Upper body dressing/undressing steps patient completed: Thread/unthread right sleeve of pullover shirt/dresss, Thread/unthread left sleeve of pullover shirt/dress Upper body dressing/undressing: 3: Mod-Patient completed 50-74% of tasks FIM - Lower Body Dressing/Undressing Lower body dressing/undressing steps patient completed: Thread/unthread right underwear leg, Pull underwear up/down, Thread/unthread right pants leg, Pull pants up/down Lower body dressing/undressing: 3: Mod-Patient completed 50-74% of tasks  FIM - Toileting Toileting steps completed by patient: Performs perineal hygiene, Adjust clothing prior to toileting, Adjust clothing after toileting Toileting Assistive Devices: Grab bar or rail for support Toileting: 4: Steadying assist  FIM - TRadio producerDevices: Grab bars Toilet Transfers: 4-From toilet/BSC: Min A (  steadying Pt. > 75%), 4-To toilet/BSC: Min A (steadying Pt. > 75%)  FIM - Control and instrumentation engineer  Devices: Arm rests, Copy: 4: Bed > Chair or W/C: Min A (steadying Pt. > 75%), 4: Chair or W/C > Bed: Min A (steadying Pt. > 75%)  FIM - Locomotion: Wheelchair Distance: 25 Locomotion: Wheelchair: 1: Travels less than 50 ft with moderate assistance (Pt: 50 - 74%) FIM - Locomotion: Ambulation Locomotion: Ambulation Assistive Devices: Other (comment) (HHA) Ambulation/Gait Assistance: 3: Mod assist Locomotion: Ambulation: 1: Travels less than 50 ft with moderate assistance (Pt: 50 - 74%)  Comprehension Comprehension Mode: Auditory Comprehension: 5-Understands complex 90% of the time/Cues < 10% of the time  Expression Expression Mode: Verbal Expression: 5-Expresses complex 90% of the time/cues < 10% of the time  Social Interaction Social Interaction: 4-Interacts appropriately 75 - 89% of the time - Needs redirection for appropriate language or to initiate interaction.  Problem Solving Problem Solving: 4-Solves basic 75 - 89% of the time/requires cueing 10 - 24% of the time  Memory Memory: 4-Recognizes or recalls 75 - 89% of the time/requires cueing 10 - 24% of the time  Medical Problem List and Plan: 1. Functional deficits secondary to embolic right MCA infarct 2. DVT Prophylaxis/Anticoagulation: Pharmaceutical: Coumadin 3. Pain Management: Tylenol prn 4. Mood: Appears to have anxiety with occasional hypoventilation. LCSW to follow for evaluation and support.  5. Neuropsych: This patient is intermittently capable of making decisions on his own behalf. Encourage compliance with all medications.  6. Skin/Wound Care: Routine pressure relief measures.  7. Fluids/Electrolytes/Nutrition: Monitor I/O. bmet essentially normal today. Encourage nectar liquids.  8. Chronic Systolic CHF: Low salt diet. Monitor weights daily.weight is up, pulse elevated but syst BP low Lasix every other day. Tolerating therapy without issues, slept poor last noc, HR is up, ask CHF team  to eval 9. DM type 2:uncontrolled Monitor BS with achs checks.  -increase lantus to 25U qhs 10.  S/P pacemaker LOS (Days) 5 A FACE TO FACE EVALUATION WAS PERFORMED  Charlett Blake 01/31/2015 7:27 AM

## 2015-01-31 NOTE — Progress Notes (Signed)
Pt complains of SOB. O2 was 94% and above. Pt denies any pain. Lung sounds clear, lower lobes diminished. Vital signs: temp 97.5, BP 96/73, P 109, R 18, O2 95% on 2L O2. Will continue to monitor. Kennieth Francois, RN

## 2015-01-31 NOTE — Progress Notes (Signed)
Dennis [name] called back and asked to have a rapid response nurse assess his lung sounds and BP before we hold the lasix. Rapid response assessed him, and lung sounds are clear. Pt says he is SOB at times. BP currently 90/64, and 99% on room air. Called Dennis [name] back and she said to go ahead and administer the lasix and closely monitor his BP. Will continue to monitor. Kennieth Francois, RN

## 2015-01-31 NOTE — Progress Notes (Signed)
ANTICOAGULATION CONSULT NOTE - Follow Up Consult  Pharmacy Consult for coumadin Indication: left ventricular noncompaction  No Known Allergies  Patient Measurements: Height: 6' (182.9 cm) Weight: 157 lb (71.215 kg) IBW/kg (Calculated) : 77.6 Heparin Dosing Weight:   Vital Signs: Temp: 97.6 F (36.4 C) (04/20 0437) Temp Source: Oral (04/20 0437) BP: 103/75 mmHg (04/20 0437) Pulse Rate: 118 (04/20 0437)  Labs:  Recent Labs  01/29/15 0555 01/30/15 0800 01/30/15 1839  HGB  --   --  12.0*  HCT  --   --  37.4*  PLT  --   --  189  LABPROT 22.1* 26.4*  --   INR 1.91* 2.40*  --   CREATININE 0.99 0.95  --     Estimated Creatinine Clearance: 91.6 mL/min (by C-G formula based on Cr of 0.95).   Medications:  Scheduled:  . atorvastatin  20 mg Oral q1800  . furosemide  20 mg Oral Q48H  . insulin aspart  0-20 Units Subcutaneous TID WC  . insulin aspart  5 Units Subcutaneous TID WC  . insulin glargine  25 Units Subcutaneous QHS  . senna-docusate  1 tablet Oral BID  . Warfarin - Physician Dosing Inpatient   Does not apply q1800   Infusions:    Assessment: 54 yo male with left ventricular noncompaction is currently on therapeutic coumadin.  INR toady is 2.78 from 2.4.  Goal of Therapy:  INR 2-3 Monitor platelets by anticoagulation protocol: Yes   Plan:  Coumadin 7.5 mg today Daily PT/INR  So, Tsz-Yin 01/31/2015,8:10 AM

## 2015-01-31 NOTE — Patient Care Conference (Signed)
Inpatient RehabilitationTeam Conference and Plan of Care Update Date: 01/31/2015   Time: 11;15 Am    Patient Name: Dennis [name]      Medical Record Number: [id number]  Date of Birth: Aug 19, 1961 Sex: Male         Room/Bed: 4M03C/4M03C-01 Payor Info: Payor: Marine scientist / Plan: [employer] MEDICARE / Product Type: *No Product type* /    Admitting Diagnosis: R CVA  Admit Date/Time:  01/26/2015  6:12 PM Admission Comments: No comment available   Primary Diagnosis:  Embolic stroke involving right middle cerebral artery Principal Problem: Embolic stroke involving right middle cerebral artery  Patient Active Problem List   Diagnosis Date Noted  . Stroke   . Stroke with cerebral ischemia   . CVA (cerebral infarction) 01/26/2015  . Left-sided weakness   . Embolic stroke involving right middle cerebral artery   . Cardiac LV ejection fraction 10-20%   . LV non-compaction cardiomyopathy   . Acute systolic heart failure 73/53/2992  . Acute ischemic stroke 01/24/2015  . Cocaine abuse 01/24/2015  . H/O noncompliance with medical treatment, presenting hazards to health 01/24/2015  . Tobacco abuse 01/24/2015  . Essential hypertension 01/24/2015  . Hypotension 01/24/2015  . Hyperlipidemia 01/24/2015  . Dysphagia following cerebral infarction 01/24/2015  . Hemiplegia affecting left nondominant side 01/24/2015  . Diabetes mellitus type 2, uncontrolled, with complications 42/68/3419  . Hyperlipidemia associated with type 2 diabetes mellitus 11/30/2014  . CAD (coronary artery disease) 11/16/2014  . Acute on chronic systolic CHF (congestive heart failure) 09/21/2014  . Chest pain on breathing 09/21/2014  . Chronic systolic heart failure 62/22/9798  . Acute on chronic systolic heart failure   . Automatic implantable cardioverter-defibrillator in situ   . Left ventricular noncompaction 05/27/2014  . Dyspnea 05/26/2014  . Chest discomfort 08/09/2013  . Long term (current) use of anticoagulants  08/09/2013  . Drug abuse and dependence   . CHF (congestive heart failure)   . BP (high blood pressure) 03/04/2013  . Cardiomyopathy, ischemic 07/21/2011  . Type II diabetes mellitus with ophthalmic manifestations 09/13/2007  . TOBACCO ABUSE 09/13/2007  . WEIGHT LOSS, ABNORMAL 09/13/2007  . SOB 09/13/2007    Expected Discharge Date: Expected Discharge Date: 02/08/15  Team Members Present: Physician leading conference: Dr. Alysia Penna Social Worker Present: Ovidio Kin, LCSW Nurse Present: Elliot Cousin, RN PT Present: Georjean Mode, Dustin Folks, PT OT Present: Clyda Greener, Jules Schick, OT SLP Present: Windell Moulding, SLP PPS Coordinator present : Daiva Nakayama, RN, CRRN     Current Status/Progress Goal Weekly Team Focus  Medical   very weak chronically, subjective SOB, ? depression  home d/c, improve compliance with t plan  reduce symptoms of SOB   Bowel/Bladder   continent B/B  remain continent   Offer urinal/laxatives as needed   Swallow/Nutrition/ Hydration   Regular, thin; intermittent supervision   mod I   diet toleration   ADL's   min guard for toilet transfers and shower transfers, min assist for LB dressing supervision for UB dressing, min guard for bathing, decreased selective attention and endurance, motor planning difficulty with LUE use  modified independent overall  selfcare re-training, transfer training, balance re-training, therapeutic exercise, pt/family education   Mobility   Bed mobility with S, transfers wtih Min A, gait with Mod A, stairs with Mod A  Mod I bed mobility, transfers with S, gait with S x150'  Cognitive remediation, NMR for L hip, balance training, safety, activity tolerance   Communication  na        Safety/Cognition/ Behavioral Observations  min assist   supervision   safety awareness, higher level problem solving, left attention, sustained attention   Pain   denies pain   less than 3 out 10  offer pain medicine as  needed   Skin   skin WNL  skin remain intact   peform routine skin checks      *See Care Plan and progress notes for long and short-term goals.  Barriers to Discharge: chronic fatigue    Possible Resolutions to Barriers:  ask cardilogy and neuropsych to eval    Discharge Planning/Teaching Needs:  Home with mom who can only provide supervision-not close.  Pt's safety will be an issue due to poor judgement and impulsivity      Team Discussion:  Medical issues-SOB chest x-ray ordered, wearing o2 currently. HF team saw today and adjusted medicines. Goals-supervision -mod/i level. Strengthening and endurance an issue-making progress in therapies  Revisions to Treatment Plan:  None   Continued Need for Acute Rehabilitation Level of Care: The patient requires daily medical management by a physician with specialized training in physical medicine and rehabilitation for the following conditions: Daily direction of a multidisciplinary physical rehabilitation program to ensure safe treatment while eliciting the highest outcome that is of practical value to the patient.: Yes Daily medical management of patient stability for increased activity during participation in an intensive rehabilitation regime.: Yes Daily analysis of laboratory values and/or radiology reports with any subsequent need for medication adjustment of medical intervention for : Neurological problems  Dupree, Gardiner Rhyme 01/31/2015, 1:56 PM

## 2015-01-31 NOTE — Progress Notes (Signed)
RN Maudie Mercury called to see if RT could assess patient breath sounds due to the patient stating he was SOB. RT auscultated and the patient has fine crackles in the bases bilaterally.  Patient had just recently coughed up a moderate amount of phlegm prior to RT assessing him.  RT will continue to monitor.

## 2015-01-31 NOTE — Progress Notes (Addendum)
Physical Therapy Session Note  Patient Details  Name: Dennis [name] MRN: [id number] Date of Birth: 03/30/1961  Today's Date: 01/31/2015 PT Individual Time:1120-1208, 5643-3295 PT Individual Time Calculation (min): 48, 45 min   Short Term Goals: Week 1:  PT Short Term Goal 1 (Week 1): Pt will demonstrate sit to stand with SBA with LRAD.  PT Short Term Goal 2 (Week 1): Pt will demonstrate midline positioning when lying in bed (instead of leaning to R side) at rest.  PT Short Term Goal 3 (Week 1): Pt will initiate ambulation with PT when blood pressure is stable.  PT Short Term Goal 4 (Week 1): Pt will initiate w/c propulsion with PT, as activity tolerance/upright tolerance improves.  PT Short Term Goal 5 (Week 1): Pt will initiate stair training with PT as blood pressure is stable.   Skilled Therapeutic Interventions/Progress Updates:  tx 1: Pt on bedside tx due to cardiac issues; BP 93/63, HR 56 and 97% sat at rest in supine.    Pt scooted up in bed and rolled with cues for LUE and LLE use, extra time.  Therapeutic exercise performed with LE and LUE  to increase strength for functional mobility.: 10 x 1 each in supine R and L straight leg raises, L hip abd, L knee flex/ext off edge of bed, L resisted ankle PF, in r sidelying L resisted hip flex/ext with flexed knee to isolate glut.   BP dropped slightly with ex, see vitals.  Pt lethargic, dozing between sets, requiring VCs and tactile cues to attend.   Family requested pt have heavy snack every evening due to pt waking up hungry every night.  PT spoke with RN about this.  Pt left sleeping on R side, bed alarm on and all needs within reach.  Family in room.  tx 2:  At rest in supine, BP 90/73, HR 109, 99% sat on 2 L O2.  With ex, BP 100/74, HR 102, 96% sat on room air.  Pt reported he needed to urinate.  While standing, pt dropped empty urinal x 3 due to inattention while also managing clothing, min assist for standing balance.  Then pt  stated he needed to have BM.  PT consulted RN who stated pt could get OOB to BR; O2 removed.    Hand hygiene in sitting at sink, with extra time for attention to task.   neuromuscular re-education for L stance stability in standing at sink, bil UE support: 10 x 1 mini squats, calf raises, toe raises, 5 x 1 each hip abduction. Pt required seated rest breaks often during ex.     W/c switched to 18 x 18 standard height.  Leg rests adjusted for fit and function.     Therapy Documentation Precautions:  Precautions Precautions: Fall Precaution Comments: L side hemiplegia & neglect Restrictions Weight Bearing Restrictions: No PT Amount of Missed Time (min): 15 Minutes PT Missed Treatment Reason: Patient fatigue (exhausted) Vital Signs: Therapy Vitals Pulse Rate: (!) 102 BP: 100/74 mmHg (after ex in standing) Patient Position (if appropriate): Sitting Oxygen Therapy SpO2: 96 % O2 Device: Not Delivered Pain: Pain Assessment None reported    See FIM for current functional status  Therapy/Group: Individual Therapy  COOK,CAROLINE 01/31/2015, 4:10 PM

## 2015-01-31 NOTE — Progress Notes (Signed)
Occupational Therapy Session Note  Patient Details  Name: Dennis [name] MRN: [id number] Date of Birth: 04-Mar-1961  Today's Date: 01/31/2015 OT Individual Time: 1423-9532 OT Individual Time Calculation (min): 75 min    Short Term Goals: Week 1:  OT Short Term Goal 1 (Week 1): STG=LTG  Skilled Therapeutic Interventions/Progress Updates:    Pt performed bathing and dressing sitting EOB per MD recommendations.  He was able to to complete most tasks with min guard assist but continues to report increased fatigue and SOB.  HR checked at 108 during task with BP in sitting 97/76.  Oxygen sats were 97% on room air.  [name] needs max instructional cueing to consistently integrate the LUE into bathing.  When he does he is able to use it with supervision to min assist for bathing and dressing.  This session he was able to tie up the string in his pants as well with increased time.  When dressing was completed issued red foam grip and provided mod facilitation for him to use the non-dominant LUE to eat some of his fruit.  Last part of session focused on LUE use to pick up and place checkers, one at a time, in a grid.  Pt needed mod instructional cueing to concentrate on tip to tip pinch and when reaching to avoid leaning to the right side and hiking the left shoulder.  Noted pt still using synergy pattern for reaching.  Instructed pt's girlfriend on acquiring playing cards and checkers to continue working on FM coordination and reach.  Pt placed in bed with radiology present to take for x-ray.    Therapy Documentation Precautions:  Precautions Precautions: Fall Precaution Comments: L side hemiplegia & neglect Restrictions Weight Bearing Restrictions: No  Pain: Pain Assessment Pain Assessment: No/denies pain ADL: See FIM for current functional status  Therapy/Group: Individual Therapy  MCGUIRE,JAMES OTR/L 01/31/2015, 11:09 AM

## 2015-01-31 NOTE — Progress Notes (Signed)
Pt complaint of SOB again, oxygen 97% on 2L O2.  P 115. Called respiratory to assess pt and noted fine crackles in the lower lobes. Upper lobes clear. Pt now has a productive cough.  Dennis [place], PA notified, new orders received. Will continue to monitor. Kennieth Francois, RN

## 2015-02-01 ENCOUNTER — Inpatient Hospital Stay (HOSPITAL_COMMUNITY): Payer: Medicare Other | Admitting: Occupational Therapy

## 2015-02-01 ENCOUNTER — Inpatient Hospital Stay (HOSPITAL_COMMUNITY): Payer: Medicare Other

## 2015-02-01 ENCOUNTER — Inpatient Hospital Stay (HOSPITAL_COMMUNITY)
Admission: AD | Admit: 2015-02-01 | Discharge: 2015-02-07 | DRG: 287 | Disposition: A | Discharge status: Rehabilitation Facility | Payer: Medicare Other | Source: Ambulatory Visit | Attending: Internal Medicine | Admitting: Internal Medicine

## 2015-02-01 ENCOUNTER — Inpatient Hospital Stay (HOSPITAL_COMMUNITY): Payer: Medicare Other | Admitting: Speech Pathology

## 2015-02-01 DIAGNOSIS — I63411 Cerebral infarction due to embolism of right middle cerebral artery: Secondary | ICD-10-CM | POA: Diagnosis not present

## 2015-02-01 DIAGNOSIS — G4733 Obstructive sleep apnea (adult) (pediatric): Secondary | ICD-10-CM | POA: Diagnosis present

## 2015-02-01 DIAGNOSIS — Z794 Long term (current) use of insulin: Secondary | ICD-10-CM

## 2015-02-01 DIAGNOSIS — Z09 Encounter for follow-up examination after completed treatment for conditions other than malignant neoplasm: Secondary | ICD-10-CM

## 2015-02-01 DIAGNOSIS — I952 Hypotension due to drugs: Secondary | ICD-10-CM | POA: Diagnosis present

## 2015-02-01 DIAGNOSIS — I272 Other secondary pulmonary hypertension: Secondary | ICD-10-CM | POA: Diagnosis present

## 2015-02-01 DIAGNOSIS — I255 Ischemic cardiomyopathy: Secondary | ICD-10-CM | POA: Diagnosis present

## 2015-02-01 DIAGNOSIS — Z95810 Presence of automatic (implantable) cardiac defibrillator: Secondary | ICD-10-CM

## 2015-02-01 DIAGNOSIS — F17210 Nicotine dependence, cigarettes, uncomplicated: Secondary | ICD-10-CM | POA: Diagnosis present

## 2015-02-01 DIAGNOSIS — E785 Hyperlipidemia, unspecified: Secondary | ICD-10-CM | POA: Diagnosis present

## 2015-02-01 DIAGNOSIS — I5022 Chronic systolic (congestive) heart failure: Secondary | ICD-10-CM | POA: Diagnosis not present

## 2015-02-01 DIAGNOSIS — E1165 Type 2 diabetes mellitus with hyperglycemia: Secondary | ICD-10-CM | POA: Diagnosis present

## 2015-02-01 DIAGNOSIS — E118 Type 2 diabetes mellitus with unspecified complications: Secondary | ICD-10-CM | POA: Insufficient documentation

## 2015-02-01 DIAGNOSIS — Z79899 Other long term (current) drug therapy: Secondary | ICD-10-CM | POA: Diagnosis not present

## 2015-02-01 DIAGNOSIS — I2510 Atherosclerotic heart disease of native coronary artery without angina pectoris: Secondary | ICD-10-CM | POA: Diagnosis present

## 2015-02-01 DIAGNOSIS — G8194 Hemiplegia, unspecified affecting left nondominant side: Secondary | ICD-10-CM | POA: Diagnosis not present

## 2015-02-01 DIAGNOSIS — Z9119 Patient's noncompliance with other medical treatment and regimen: Secondary | ICD-10-CM | POA: Diagnosis present

## 2015-02-01 DIAGNOSIS — I2582 Chronic total occlusion of coronary artery: Secondary | ICD-10-CM | POA: Diagnosis present

## 2015-02-01 DIAGNOSIS — Z8673 Personal history of transient ischemic attack (TIA), and cerebral infarction without residual deficits: Secondary | ICD-10-CM | POA: Insufficient documentation

## 2015-02-01 DIAGNOSIS — Z7901 Long term (current) use of anticoagulants: Secondary | ICD-10-CM | POA: Diagnosis not present

## 2015-02-01 DIAGNOSIS — I429 Cardiomyopathy, unspecified: Secondary | ICD-10-CM | POA: Diagnosis present

## 2015-02-01 DIAGNOSIS — I6930 Unspecified sequelae of cerebral infarction: Secondary | ICD-10-CM | POA: Insufficient documentation

## 2015-02-01 DIAGNOSIS — Z95828 Presence of other vascular implants and grafts: Secondary | ICD-10-CM | POA: Insufficient documentation

## 2015-02-01 DIAGNOSIS — T501X5A Adverse effect of loop [high-ceiling] diuretics, initial encounter: Secondary | ICD-10-CM | POA: Diagnosis present

## 2015-02-01 DIAGNOSIS — Z7982 Long term (current) use of aspirin: Secondary | ICD-10-CM | POA: Diagnosis not present

## 2015-02-01 DIAGNOSIS — I69354 Hemiplegia and hemiparesis following cerebral infarction affecting left non-dominant side: Secondary | ICD-10-CM

## 2015-02-01 DIAGNOSIS — F1410 Cocaine abuse, uncomplicated: Secondary | ICD-10-CM | POA: Diagnosis present

## 2015-02-01 DIAGNOSIS — Z9889 Other specified postprocedural states: Secondary | ICD-10-CM | POA: Diagnosis not present

## 2015-02-01 DIAGNOSIS — I5023 Acute on chronic systolic (congestive) heart failure: Secondary | ICD-10-CM | POA: Diagnosis present

## 2015-02-01 DIAGNOSIS — I959 Hypotension, unspecified: Secondary | ICD-10-CM | POA: Diagnosis present

## 2015-02-01 DIAGNOSIS — I10 Essential (primary) hypertension: Secondary | ICD-10-CM | POA: Diagnosis not present

## 2015-02-01 DIAGNOSIS — I9589 Other hypotension: Secondary | ICD-10-CM | POA: Diagnosis not present

## 2015-02-01 LAB — CBC
HCT: 38.4 % — ABNORMAL LOW (ref 39.0–52.0)
Hemoglobin: 12.5 g/dL — ABNORMAL LOW (ref 13.0–17.0)
MCH: 30.9 pg (ref 26.0–34.0)
MCHC: 32.6 g/dL (ref 30.0–36.0)
MCV: 95 fL (ref 78.0–100.0)
Platelets: 204 10*3/uL (ref 150–400)
RBC: 4.04 MIL/uL — ABNORMAL LOW (ref 4.22–5.81)
RDW: 12.4 % (ref 11.5–15.5)
WBC: 8 10*3/uL (ref 4.0–10.5)

## 2015-02-01 LAB — URINE CULTURE
Colony Count: NO GROWTH
Culture: NO GROWTH

## 2015-02-01 LAB — BASIC METABOLIC PANEL
Anion gap: 8 (ref 5–15)
BUN: 18 mg/dL (ref 6–23)
CO2: 30 mmol/L (ref 19–32)
Calcium: 9.1 mg/dL (ref 8.4–10.5)
Chloride: 100 mmol/L (ref 96–112)
Creatinine, Ser: 1.08 mg/dL (ref 0.50–1.35)
GFR calc Af Amer: 89 mL/min — ABNORMAL LOW (ref >90)
GFR calc non Af Amer: 77 mL/min — ABNORMAL LOW (ref >90)
Glucose, Bld: 62 mg/dL — ABNORMAL LOW (ref 70–99)
Potassium: 3.7 mmol/L (ref 3.5–5.1)
Sodium: 138 mmol/L (ref 135–145)

## 2015-02-01 LAB — PROTIME-INR
INR: 2.87 — ABNORMAL HIGH (ref 0.00–1.49)
Prothrombin Time: 30.3 seconds — ABNORMAL HIGH (ref 11.6–15.2)

## 2015-02-01 LAB — GLUCOSE, CAPILLARY
Glucose-Capillary: 163 mg/dL — ABNORMAL HIGH (ref 70–99)
Glucose-Capillary: 222 mg/dL — ABNORMAL HIGH (ref 70–99)
Glucose-Capillary: 266 mg/dL — ABNORMAL HIGH (ref 70–99)

## 2015-02-01 LAB — MRSA PCR SCREENING: MRSA by PCR: NEGATIVE

## 2015-02-01 IMAGING — CR DG CHEST 1V PORT
1 series · 1 of 1 positions shown · non-contrast
Comparison: [DATE]

CLINICAL DATA: PICC line placement.  Pneumonia, CHF, diabetes

EXAM:
PORTABLE CHEST - 1 VIEW

[AP]
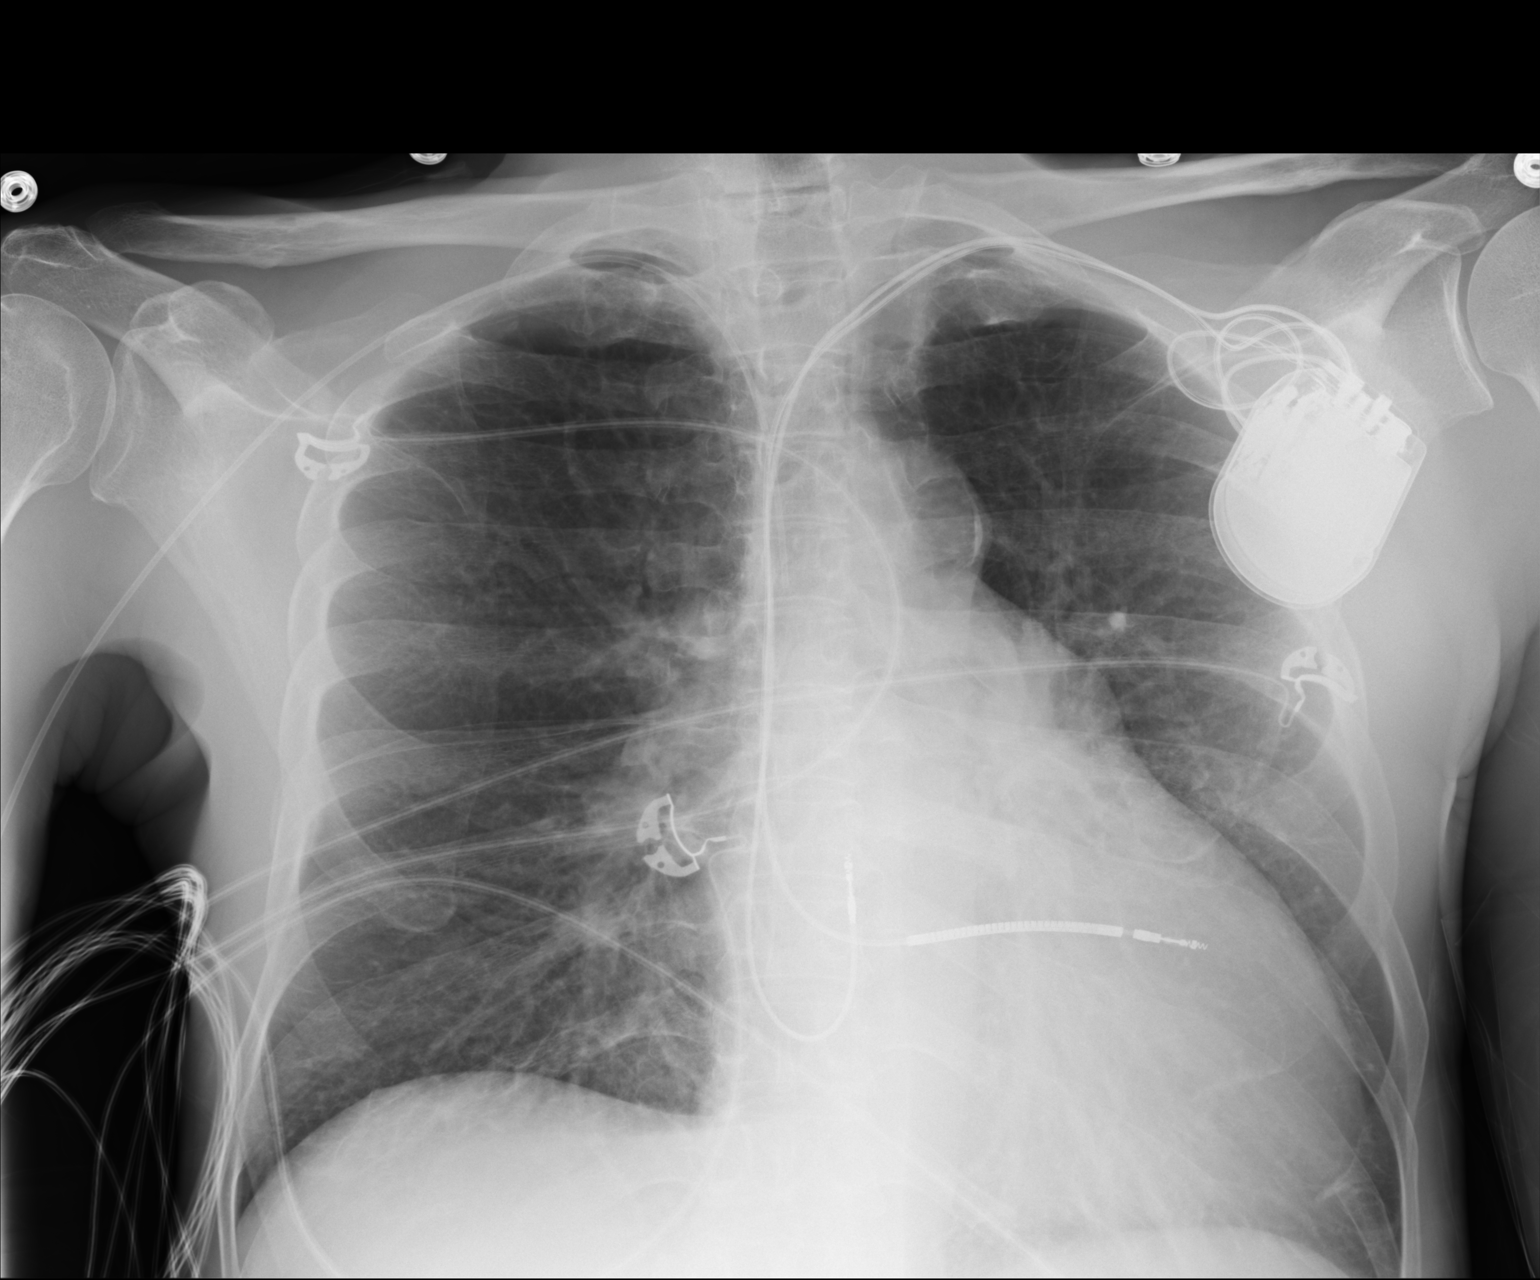

[1 of 1 positions shown; findings below may reference images not displayed]

FINDINGS: Left pacer remains in place, unchanged. Heart is enlarged. No
confluent opacities or effusions. No acute bony abnormality. Right
PICC line is in place with the tip in the SVC.
IMPRESSION: Right PICC line tip in the SVC.

Cardiomegaly.  No active cardiopulmonary disease.

## 2015-02-01 IMAGING — CR DG CHEST 1V PORT
1 series · 1 of 1 positions shown · non-contrast
Comparison: [DATE]

CLINICAL DATA: Hypotension

EXAM:
PORTABLE CHEST - 1 VIEW

[AP]
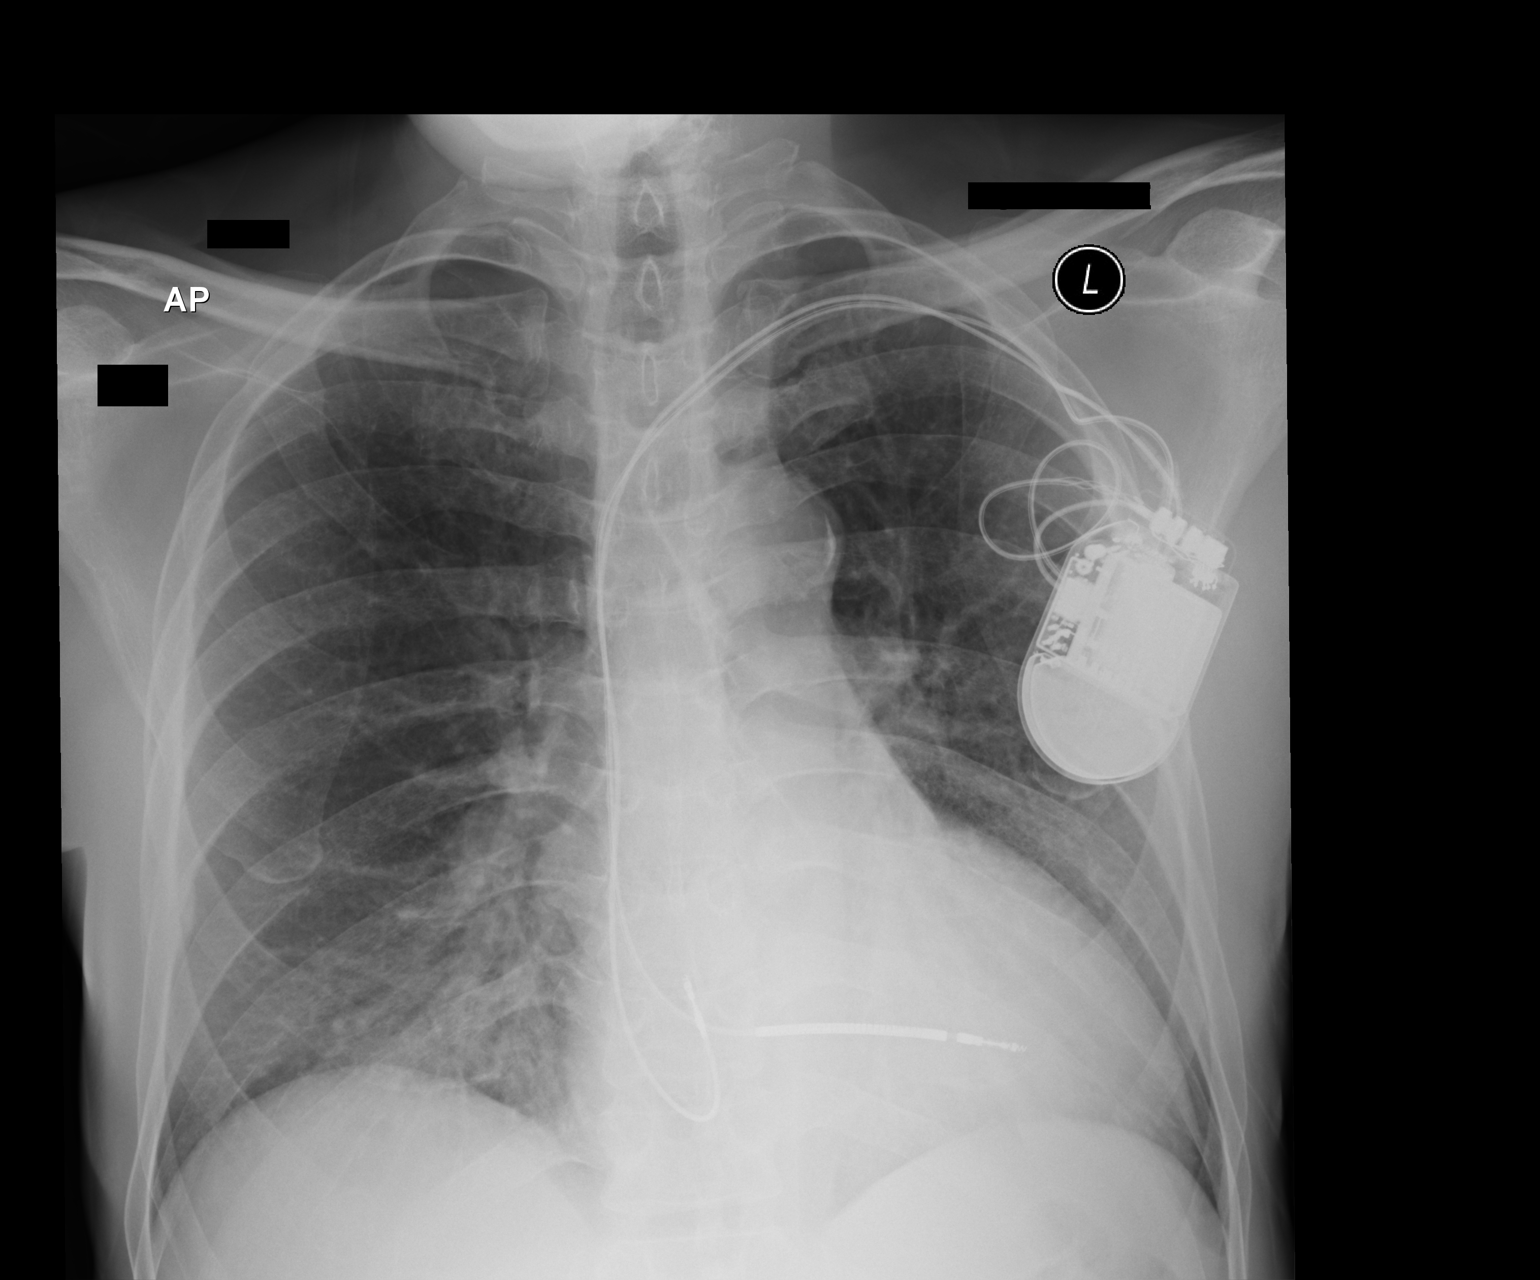

[1 of 1 positions shown; findings below may reference images not displayed]

FINDINGS: Cardiac shadow is stable. A pacing device is again seen and stable.
Persistent increased density is noted in the right medial lung base
consistent with acute infiltrate. The perihilar changes seen
previously have resolved in the interval.
IMPRESSION: Persistent right medial lung base changes

## 2015-02-01 MED ORDER — ACETAMINOPHEN 500 MG PO TABS
1000.0000 mg | ORAL_TABLET | Freq: Four times a day (QID) | ORAL | Status: DC | PRN
  Administered 2015-02-02: 1000 mg via ORAL
  Filled 2015-02-01: qty 2

## 2015-02-01 MED ORDER — INSULIN GLARGINE 100 UNIT/ML SOLOSTAR PEN: 25.0000 [IU] | PEN_INJECTOR | Freq: Every day | SUBCUTANEOUS | Status: DC

## 2015-02-01 MED ORDER — SODIUM CHLORIDE 0.9 % IJ SOLN
10.0000 mL | Freq: Two times a day (BID) | INTRAMUSCULAR | Status: DC
  Administered 2015-02-02 (×2): 10 mL
  Administered 2015-02-02: 20 mL
  Administered 2015-02-03 – 2015-02-06 (×6): 10 mL

## 2015-02-01 MED ORDER — WARFARIN - PHARMACIST DOSING INPATIENT
Freq: Every day | Status: DC
  Administered 2015-02-01 – 2015-02-05 (×3)

## 2015-02-01 MED ORDER — DIGOXIN 125 MCG PO TABS
0.1250 mg | ORAL_TABLET | Freq: Every day | ORAL | Status: DC
  Administered 2015-02-02 – 2015-02-05 (×4): 0.125 mg via ORAL
  Filled 2015-02-01 (×4): qty 1

## 2015-02-01 MED ORDER — ATORVASTATIN CALCIUM 20 MG PO TABS
20.0000 mg | ORAL_TABLET | Freq: Every day | ORAL | Status: DC
  Administered 2015-02-01 – 2015-02-07 (×7): 20 mg via ORAL
  Filled 2015-02-01 (×7): qty 1

## 2015-02-01 MED ORDER — WARFARIN SODIUM 5 MG PO TABS
5.0000 mg | ORAL_TABLET | Freq: Once | ORAL | Status: AC
  Administered 2015-02-01: 5 mg via ORAL
  Filled 2015-02-01: qty 1

## 2015-02-01 MED ORDER — SODIUM CHLORIDE 0.9 % IJ SOLN
10.0000 mL | INTRAMUSCULAR | Status: DC | PRN
  Administered 2015-02-05 – 2015-02-06 (×4): 10 mL
  Filled 2015-02-01 (×4): qty 40

## 2015-02-01 MED ORDER — WARFARIN SODIUM 5 MG PO TABS
5.0000 mg | ORAL_TABLET | Freq: Once | ORAL | Status: DC
  Filled 2015-02-01: qty 1

## 2015-02-01 MED ORDER — INSULIN GLARGINE 100 UNIT/ML ~~LOC~~ SOLN
25.0000 [IU] | Freq: Every day | SUBCUTANEOUS | Status: DC
Start: 2015-02-01 — End: 2015-02-03
  Administered 2015-02-01 – 2015-02-02 (×2): 25 [IU] via SUBCUTANEOUS
  Filled 2015-02-01 (×3): qty 0.25

## 2015-02-01 MED ORDER — GLUCERNA SHAKE PO LIQD
237.0000 mL | Freq: Three times a day (TID) | ORAL | Status: DC
  Administered 2015-02-01 – 2015-02-07 (×17): 237 mL via ORAL
  Filled 2015-02-01: qty 237

## 2015-02-01 NOTE — Progress Notes (Signed)
Advanced Heart Failure Rounding Note   Subjective:   The HF Team was asked to consult for increased dyspnea and tachycardia by Dr Letta Pate. Prior to admit he had been followed in the HF clinic and was last seen 12/01/2014 and he was stable.   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 Neurology consulted. CT showed R MCA stroke. Did not receive TPA due to delay in arrival. Had ECHO that showed EF 10-15% mild RV HK. No obvious LV clot. As he improved he was transferred to CIR.   Yesterday he had increased dyspnea. Started on IV lasix. Diuresed 1.3 liters. Hypotensive this am. SBP 70s.   Complaining of fatigue.     Objective:   Weight Range:  Vital Signs:   Temp:  [98.1 F (36.7 C)-98.6 F (37 C)] 98.1 F (36.7 C) (04/21 0558) Pulse Rate:  [91-118] 98 (04/21 1600) Resp:  [20-22] 22 (04/21 1600) BP: (77-117)/(56-74) 90/62 mmHg (04/21 1600) SpO2:  [99 %-100 %] 99 % (04/21 1600) Weight:  [67.9 kg (149 lb 11.1 oz)] 67.9 kg (149 lb 11.1 oz) (04/21 0558)    Intake/Output:  No intake or output data in the 24 hours ending 02/01/15 1657 Physical Exam: General: Fatigued appearing. No resp difficulty HEENT: normal Neck: supple. JVP 9-10 Carotids 2+ bilat; no bruits. No lymphadenopathy or thryomegaly appreciated. Cor: PMI laterally displaced. Tachy Regular rate & rhythm. +s3 2/6 MR Lungs: clear Abdomen: soft, nontender, mildly distended. No hepatosplenomegaly. No bruits or masses. Good bowel sounds. Extremities: no cyanosis, clubbing, rash, edema. LUE weak  Neuro: alert & orientedx3, cranial nerves grossly intact. Affect flat    Labs: Basic Metabolic Panel:  Recent Labs Lab 01/29/15 0555 01/30/15 0800 01/31/15 0809 01/31/15 1055 02/01/15 1236  NA 134* 135 131* 136 138  K 4.3 4.2 5.6* 4.0 3.7  CL 101 103 99 104  100  CO2 '25 26 24 26 30  '$ GLUCOSE 326* 279* 309* 173* 62*  BUN '21 17 15 14 18  '$ CREATININE 0.99 0.95 1.03 0.87 1.08  CALCIUM 8.5 8.6 8.7 8.9 9.1    Liver Function Tests:  Recent Labs Lab 01/27/15 0647  AST 21  ALT 18  ALKPHOS 85  BILITOT 1.1  PROT 5.6*  ALBUMIN 2.8*   No results for input(s): LIPASE, AMYLASE in the last 168 hours. No results for input(s): AMMONIA in the last 168 hours.  CBC:  Recent Labs Lab 01/27/15 0647 01/30/15 1839 02/01/15 1415  WBC 10.5 9.6 8.0  NEUTROABS 6.4 6.2  --   HGB 13.7 12.0* 12.5*  HCT 42.4 37.4* 38.4*  MCV 94.9 94.7 95.0  PLT 180 189 204    Cardiac Enzymes: No results for input(s): CKTOTAL, CKMB, CKMBINDEX, TROPONINI in the last 168 hours.  BNP: BNP (last 3 results)  Recent Labs  12/03/14 0351 01/24/15 2005  BNP 321.1* 166.1*    ProBNP (last 3 results)  Recent Labs  08/13/14 1850 09/21/14 1928 10/02/14 1033  PROBNP 1525.0* 1870.0* 1085.0*      Other results:  Imaging: Dg Chest 2 View  01/31/2015   CLINICAL DATA:  Shortness of breath, left-sided weakness. Stroke glass Wednesday.  EXAM: CHEST  2 VIEW  COMPARISON:  01/26/2015  FINDINGS: Left AICD remains in place, unchanged. Heart is upper limits normal in size. New airspace opacities in the perihilar regions and lower lobes, right slightly greater than  left. This presumably represents edema although infection cannot be excluded. No effusions. No acute bony abnormality.  IMPRESSION: Bilateral perihilar and lower lobe airspace opacities, most likely edema. Recommend clinical correlation to exclude pneumonia.   Electronically Signed   By: Rolm Baptise M.D.   On: 01/31/2015 10:49   Dg Chest Port 1 View  02/01/2015   CLINICAL DATA:  Hypotension  EXAM: PORTABLE CHEST - 1 VIEW  COMPARISON:  01/31/2015  FINDINGS: Cardiac shadow is stable. A pacing device is again seen and stable. Persistent increased density is noted in the right medial lung base consistent with acute  infiltrate. The perihilar changes seen previously have resolved in the interval.  IMPRESSION: Persistent right medial lung base changes   Electronically Signed   By: Inez Catalina M.D.   On: 02/01/2015 13:22     Medications:     Scheduled Medications: . atorvastatin  20 mg Oral q1800  . [START ON 02/02/2015] digoxin  0.125 mg Oral Daily  . feeding supplement (GLUCERNA SHAKE)  237 mL Oral TID BM  . insulin glargine  25 Units Subcutaneous QHS  . warfarin  5 mg Oral ONCE-1800  . Warfarin - Pharmacist Dosing Inpatient   Does not apply q1800    Infusions:    PRN Medications: acetaminophen   Assessment:  1. CVA, acute with left-sided weakness 2. Dyspnea 3. Acute on Chronic Systolic Heart Failure- ECHO EF 10-15% with non compaction 4. CAD Coronary angiography (12/2008) with mid LAD totally occluded and collateralized. Continue statin and BB. No ASA he is on coumadin. 5. DMII  6. Hypotension- ? Low output HF    Plan/Discussion:    Hypotensivee this am. Has prominent 3rd sound. Hold spiro and lasix. BMET now.   Will discuss with primary team. Recommend transferring back to acute care on the SDU. Will need PICC for CO-OX to assess CVP to help determine if he is low output. Would [facility] to admit for co-morbidities and HF will follow.    Length of Stay: 0   Daniel Bensimhon NP-C  02/01/2015, 4:57 PM  Advanced Heart Failure Team Pager [phone] (M-F; 7a - 4p)  Please contact [facility] for night-coverage after hours (4p -7a ) and weekends on [email or web address]  Patient seen and examined with Darrick Grinder, NP. We discussed all aspects of the encounter. I agree with the assessment and plan as stated above.   He appears to have low output HF with hypotension and Class IV sx. Will trasfer to SDU. Place PICC. Follow co-ox and CVP. Will likely need inotrope support.   Daniel Bensimhon,MD 4:59 PM

## 2015-02-01 NOTE — Progress Notes (Signed)
4/2//16 1306 nsg Patient transferred to step down unit per bed accompanied by RN,NT and significant other.

## 2015-02-01 NOTE — Progress Notes (Signed)
Called by Maudie Mercury, RN to assess pt at the request of Danella Sensing, NP.  Last pm 01/30/15  Pt had an episode of dyspnea and tachycardia and was placed on IV lasix on 4/20.  Tonight pt is to receive Lasix IV but running BPs in 90s.Oncology my arrival pt is awake in bed, oriented,   BP  90/64 , bilateral BS clear, diminished in bases.     RA sats 99%  with RR 20.  Pt denies  CP, states "a little SOB  but nothing like last night".  HR 108 and regular.  Kim RN called my findings to [place], NP and obtained the order to give Lasix 40 mg IV as scheduled.  Hand off report  to Maudie Mercury, Therapist, sports.  Will follow as needed.

## 2015-02-01 NOTE — Plan of Care (Signed)
Problem: RH BOWEL ELIMINATION Goal: RH STG MANAGE BOWEL WITH ASSISTANCE STG Manage Bowel with Assistance. Mod I  Outcome: Not Met (add Reason) Transferring stepdown Goal: RH STG MANAGE BOWEL W/MEDICATION W/ASSISTANCE STG Manage Bowel with Medication with min Assistance.  Outcome: Not Met (add Reason) Transferring to stepdown  Problem: RH BLADDER ELIMINATION Goal: RH STG MANAGE BLADDER WITH ASSISTANCE STG Manage Bladder With Assistance. Mod I  Outcome: Not Met (add Reason) Transferring to step down  Problem: RH SKIN INTEGRITY Goal: RH STG SKIN FREE OF INFECTION/BREAKDOWN Patient's skin will remain free from new infection/breakdown while on Rehab.  Outcome: Not Met (add Reason) Transferring to stepdown Goal: RH STG MAINTAIN SKIN INTEGRITY WITH ASSISTANCE STG Maintain Skin Integrity With min Assistance.  Outcome: Not Met (add Reason) Transferring to stepdown  Problem: RH SAFETY Goal: RH STG ADHERE TO SAFETY PRECAUTIONS W/ASSISTANCE/DEVICE STG Adhere to Safety Precautions With Assistance/Device. Supervision  Outcome: Not Met (add Reason) Transferring to stepdown  Problem: RH COGNITION-NURSING Goal: RH STG USES MEMORY AIDS/STRATEGIES W/ASSIST TO PROBLEM SOLVE STG Uses Memory Aids/Strategies With Assistance to Problem Solve.  Outcome: Not Met (add Reason) Transferring to stepdown  Problem: RH PAIN MANAGEMENT Goal: RH STG PAIN MANAGED AT OR BELOW PT'S PAIN GOAL <3  Outcome: Not Met (add Reason) Transferring to stepdown  Problem: RH KNOWLEDGE DEFICIT Goal: RH STG INCREASE KNOWLEDGE OF DIABETES Patient/caregiver will verbalizes understanding the signs and symptoms of hypo/hyperglycemia including medication, diet, exercise with min assist.  Outcome: Not Met (add Reason) Transferring to stepdown Goal: RH STG INCREASE KNOWLEDGE OF HYPERTENSION Patient/caregiver will verbalizes understanding the signs and symptoms of hypertension including diet, exercise, medication regimen with  min assist.  Outcome: Not Met (add Reason) Transferring to stepdown

## 2015-02-01 NOTE — Progress Notes (Signed)
Peripherally Inserted Central Catheter/Midline Placement  The IV Nurse has discussed with the patient and/or persons authorized to consent for the patient, the purpose of this procedure and the potential benefits and risks involved with this procedure.  The benefits include less needle sticks, lab draws from the catheter and patient may be discharged home with the catheter.  Risks include, but not limited to, infection, bleeding, blood clot (thrombus formation), and puncture of an artery; nerve damage and irregular heat beat.  Alternatives to this procedure were also discussed.  PICC/Midline Placement Documentation  PICC / Midline Double Lumen 42/70/62 PICC Right Basilic 40 cm 0 cm (Active)  Indication for Insertion or Continuance of Line Prolonged intravenous therapies;Limited venous access - need for IV therapy >5 days (PICC only) 02/01/2015  7:10 PM  Exposed Catheter (cm) 0 cm 02/01/2015  7:10 PM  Site Assessment Clean;Dry;Intact 02/01/2015  7:10 PM  Lumen #1 Status Flushed;Saline locked;Blood return noted 02/01/2015  7:10 PM  Lumen #2 Status Flushed;Saline locked;Blood return noted 02/01/2015  7:10 PM  Dressing Type Transparent 02/01/2015  7:10 PM  Dressing Status Clean;Dry;Intact;Antimicrobial disc in place 02/01/2015  7:10 PM  Line Care Connections checked and tightened 02/01/2015  7:10 PM  Line Adjustment (NICU/IV Team Only) No 02/01/2015  7:10 PM  Dressing Intervention New dressing 02/01/2015  7:10 PM  Dressing Change Due 02/08/15 02/01/2015  7:10 PM       Rolena Infante 02/01/2015, 7:11 PM

## 2015-02-01 NOTE — Progress Notes (Signed)
Physical Therapy Session Note  Patient Details  Name: Dennis [name] MRN: [id number] Date of Birth: 05/20/1961  Today's Date: 02/01/2015 PT Individual Time: 0800-0830 PT Individual Time Calculation (min): 30 min   Short Term Goals: Week 1:  PT Short Term Goal 1 (Week 1): Pt will demonstrate sit to stand with SBA with LRAD.  PT Short Term Goal 2 (Week 1): Pt will demonstrate midline positioning when lying in bed (instead of leaning to R side) at rest.  PT Short Term Goal 3 (Week 1): Pt will initiate ambulation with PT when blood pressure is stable.  PT Short Term Goal 4 (Week 1): Pt will initiate w/c propulsion with PT, as activity tolerance/upright tolerance improves.  PT Short Term Goal 5 (Week 1): Pt will initiate stair training with PT as blood pressure is stable.   Skilled Therapeutic Interventions/Progress Updates:   With pt in supine in bed, lethargic, vitals below. Pt reported he slept better last night.   MD ordered TEDS, not here yet.  Consulted Therapist, sports.  Took BP again with pt's HOB lower, 89/64.  TEDs arrived.  PT donned TEDS with pt assisting with mass extension R and LLEs.  BP 86/67.  RN called MD.  Pt on hold until cardiologist sees pt.  BP 5 minutes after TEDs donned: 70/49, HR 54.  Pt extremely lethargic, dozing throughout time.  Informed RN of latest BP. Lowered HOB.  Girlfriend continues to have many questions about pt's diet now and at d/c, use of TEDs, etc.  PT referred her to RN regarding diet.  Educated pt and girlfriend briefly about hypotension limiting pt's ability to participate in therapies safely.    Therapy Documentation Precautions:  Precautions Precautions: Fall Precaution Comments: L side hemiplegia & neglect Restrictions Weight Bearing Restrictions: No General: PT Amount of Missed Time (min): 30 Minutes PT Missed Treatment Reason: MD hold (Comment) (hypotension; on hold until cardiologist sees pt) Vital Signs: Therapy Vitals Temp: 98.1 F (36.7 C) Temp  Source: Oral Pulse Rate: 91 Resp: 20 BP: 77/56 mmHg Patient Position (if appropriate): Lying Oxygen Therapy SpO2: 99 % O2 Device: Not Delivered Pain: Pain Assessment Pain Assessment: No/denies pain    See FIM for current functional status  Therapy/Group: Individual Therapy  COOK,CAROLINE 02/01/2015, 8:44 AM

## 2015-02-01 NOTE — Progress Notes (Addendum)
ANTICOAGULATION CONSULT NOTE - Follow Up Consult  Pharmacy Consult for coumadin Indication: left ventricular noncompaction  No Known Allergies  Patient Measurements: Height: 6' (182.9 cm) Weight: 149 lb 11.1 oz (67.9 kg) IBW/kg (Calculated) : 77.6 Heparin Dosing Weight:   Vital Signs: Temp: 98.1 F (36.7 C) (04/21 0558) Temp Source: Oral (04/21 0558) BP: 86/64 mmHg (04/21 0838) Pulse Rate: 97 (04/21 0838)  Labs:  Recent Labs  01/30/15 0800 01/30/15 1839 01/31/15 0809 01/31/15 1055 02/01/15 0855  HGB  --  12.0*  --   --   --   HCT  --  37.4*  --   --   --   PLT  --  189  --   --   --   LABPROT 26.4*  --  29.6*  --  30.3*  INR 2.40*  --  2.78*  --  2.87*  CREATININE 0.95  --  1.03 0.87  --     Estimated Creatinine Clearance: 95.4 mL/min (by C-G formula based on Cr of 0.87).   Medications:  Scheduled:  . atorvastatin  20 mg Oral q1800  . digoxin  0.25 mg Oral Daily  . feeding supplement (GLUCERNA SHAKE)  237 mL Oral BID BM  . insulin aspart  0-20 Units Subcutaneous TID WC  . insulin aspart  5 Units Subcutaneous TID WC  . insulin glargine  25 Units Subcutaneous QHS  . senna-docusate  1 tablet Oral BID  . Warfarin - Physician Dosing Inpatient   Does not apply [id number]   Summary  63 YOM with hx noncompliance who was supposed to be on warfarin for "left ventricular noncompaction" per clinic who presented with L-sided weakness.  PMH: CHF, OSA, DM, HLD, drug abuse   Rx Dialog  AC: Warfarin for hx L ventricular noncompaction - INR 2.87 << 2.78<< 2.4 < 1.91 < 1.65, big increase after 2 x '10mg'$  but no further increase after decrease to '5mg'$ , CBC stable, no bleeding; ASA stopped per neuro rec since INR >2  ID: Afebrile, WBC WNL, no abx  CV: Hx CHF (EF 20%), HLD - BP remains soft, HR high- asa, lipitor - LDL 116 , ?lowoutput - will trx to sdu to check coox/cvp Endo: Hx DM (A1c 13.3) - CBGs elevated on SSI + levemir (CBG 102-179) GI/Nutr: Dysphagia diet - LFTs WNL  Neuro: Hx  drug abuse, new non-dominant R MCA infarct- A&O, UDS +cocaine  Renal: WNL; SCr 1.03 from 0.99 and K up to 5.7 (not on K containing fluid) on 04/20; UOP 1.3 ml/kg/hr-check bmert now Pulm: Hx OSA - 98% RA  Heme/Onc: CBC WNL  Best practices: warfarin  Plan:  Coumadin 5 mg po x1 today Daily PT/INR Monitor renal function        Erin Hearing PharmD., BCPS Clinical Pharmacist Pager [phone] 02/01/2015 12:47 PM

## 2015-02-01 NOTE — Progress Notes (Signed)
Occupational Therapy Note  Patient Details  Name: Dennis [name] MRN: [id number] Date of Birth: 1960/11/03  Today's Date: 02/01/2015 OT Missed Time: 5 Minutes Missed Time Reason: MD hold (comment)  Pt is on hold from therapies and will be transferred to a step down unit today.   [place] 02/01/2015, 10:12 AM

## 2015-02-01 NOTE — Progress Notes (Signed)
02/01/15 0825 NSG Patient bp 77/56-89/64 , 91, 98%; patient appears weak and sleepy. Dr. Read Drivers notified [id number] due; orders noted; Cardiologist notified per order Dr.Kirstein. Continued to monitor patient.

## 2015-02-01 NOTE — Progress Notes (Signed)
02/01/15 1011 nursing PA notified RN that patient will be transferring to stepdown unit (waiting for room). Mother notified  of the transfer; left message to significant other.

## 2015-02-01 NOTE — Progress Notes (Signed)
Social Work Patient ID: Dennis [name], male   DOB: 1961-08-03, 54 y.o.   MRN: [id number] Met with pt and friend aware of the plan to go to step down unit, pt is still not feeling well and is hoping this will help and then he can come back here and resume his therapies. Spoke with Genie-AC to inform of plan.  Insurance aware also.

## 2015-02-01 NOTE — Plan of Care (Signed)
Problem: RH SAFETY Goal: RH STG ADHERE TO SAFETY PRECAUTIONS W/ASSISTANCE/DEVICE STG Adhere to Safety Precautions With Assistance/Device. Supervision  Outcome: Not Progressing Pt sets off bed alarm, gets OOB by himself without attempting to use call bell

## 2015-02-01 NOTE — Progress Notes (Signed)
[place] PHYSICAL MEDICINE & REHABILITATION     PROGRESS NOTE    Subjective/Complaints: Tolerated therapy without dizziness No SOB last noc, slept well per girlfriend Review of Systems - Negative except left side weak   Objective: Vital Signs: Blood pressure 103/65, pulse 107, temperature 98.1 F (36.7 C), temperature source Oral, resp. rate 20, height 6' (1.829 m), weight 67.9 kg (149 lb 11.1 oz), SpO2 100 %. Dg Chest 2 View  01/31/2015   CLINICAL DATA:  Shortness of breath, left-sided weakness. Stroke glass Wednesday.  EXAM: CHEST  2 VIEW  COMPARISON:  01/26/2015  FINDINGS: Left AICD remains in place, unchanged. Heart is upper limits normal in size. New airspace opacities in the perihilar regions and lower lobes, right slightly greater than left. This presumably represents edema although infection cannot be excluded. No effusions. No acute bony abnormality.  IMPRESSION: Bilateral perihilar and lower lobe airspace opacities, most likely edema. Recommend clinical correlation to exclude pneumonia.   Electronically Signed   By: Rolm Baptise M.D.   On: 01/31/2015 10:49    Recent Labs  01/30/15 1839  WBC 9.6  HGB 12.0*  HCT 37.4*  PLT 189    Recent Labs  01/31/15 0809 01/31/15 1055  NA 131* 136  K 5.6* 4.0  CL 99 104  GLUCOSE 309* 173*  BUN 15 14  CREATININE 1.03 0.87  CALCIUM 8.7 8.9   CBG (last 3)   Recent Labs  01/31/15 1624 01/31/15 2043 02/01/15 0645  GLUCAP 161* 102* 163*    Wt Readings from Last 3 Encounters:  02/01/15 67.9 kg (149 lb 11.1 oz)  01/26/15 67.223 kg (148 lb 3.2 oz)  12/01/14 71.396 kg (157 lb 6.4 oz)    Physical Exam:  Constitutional: He appears well-developed and well-nourished. No distress.  HENT: oral mucosa pink/dentition poor Head: Normocephalic and atraumatic.  Ext - C/C/E Eyes: Conjunctivae and EOM are normal. Pupils are equal, round, and reactive to light.  Neck - JVD Cardiovascular: Normal rate and regular rhythm. Exam  reveals no gallop and no friction rub.  No murmur heard. Respiratory: No respiratory distress. He has no wheezes. He has no rales.  GI: Soft. Bowel sounds are normal. He exhibits no distension. There is no tenderness.  Neurological: left facial droop Motor strength is 5/5 in the Right deltoid biceps triceps grip hip flexor and knee extensor ankle dorsiflexor Left upper extremity 4- to 4/5 in the deltoid, biceps, triceps, grip, hip flexor, knee extensor, ankle dorsiflexor and plantar flexor---inconsistent effort Sensation intact to light touch in bilateral upper and lower limbs\ mild left pronator drift  Psych:lethargic, was sleeping    Assessment/Plan: 1. Functional deficits secondary to right MCA infarct which require 3+ hours per day of interdisciplinary therapy in a comprehensive inpatient rehab setting. Physiatrist is providing close team supervision and 24 hour management of active medical problems listed below. Physiatrist and rehab team continue to assess barriers to discharge/monitor patient progress toward functional and medical goals.  FIM: FIM - Bathing Bathing Steps Patient Completed: Chest, Left Arm, Abdomen, Front perineal area, Buttocks, Right upper leg, Left upper leg, Right lower leg (including foot), Right Arm, Left lower leg (including foot) Bathing: 4: Steadying assist  FIM - Upper Body Dressing/Undressing Upper body dressing/undressing steps patient completed: Thread/unthread right sleeve of pullover shirt/dresss, Thread/unthread left sleeve of pullover shirt/dress, Put head through opening of pull over shirt/dress, Pull shirt over trunk Upper body dressing/undressing: 5: Supervision: Safety issues/verbal cues FIM - Lower Body Dressing/Undressing Lower body dressing/undressing steps  patient completed: Thread/unthread right underwear leg, Pull underwear up/down, Thread/unthread right pants leg, Pull pants up/down, Thread/unthread left underwear leg, Thread/unthread  left pants leg, Don/Doff right sock, Don/Doff left sock Lower body dressing/undressing: 4: Steadying Assist  FIM - Toileting Toileting steps completed by patient: Performs perineal hygiene, Adjust clothing after toileting Toileting Assistive Devices: Grab bar or rail for support Toileting: 4: Steadying assist  FIM - Radio producer Devices: Grab bars Toilet Transfers: 4-From toilet/BSC: Min A (steadying Pt. > 75%), 4-To toilet/BSC: Min A (steadying Pt. > 75%)  FIM - Control and instrumentation engineer Devices: Arm rests, Copy: 5: Supine > Sit: Supervision (verbal cues/safety issues), 5: Sit > Supine: Supervision (verbal cues/safety issues), 4: Chair or W/C > Bed: Min A (steadying Pt. > 75%), 4: Bed > Chair or W/C: Min A (steadying Pt. > 75%)  FIM - Locomotion: Wheelchair Distance: 25 Locomotion: Wheelchair: 0: Activity did not occur FIM - Locomotion: Ambulation Locomotion: Ambulation Assistive Devices: Other (comment) (HHA) Ambulation/Gait Assistance: 3: Mod assist Locomotion: Ambulation: 0: Activity did not occur  Comprehension Comprehension Mode: Auditory Comprehension: 5-Understands complex 90% of the time/Cues < 10% of the time  Expression Expression Mode: Verbal Expression: 5-Expresses complex 90% of the time/cues < 10% of the time  Social Interaction Social Interaction: 4-Interacts appropriately 75 - 89% of the time - Needs redirection for appropriate language or to initiate interaction.  Problem Solving Problem Solving: 4-Solves basic 75 - 89% of the time/requires cueing 10 - 24% of the time  Memory Memory: 4-Recognizes or recalls 75 - 89% of the time/requires cueing 10 - 24% of the time  Medical Problem List and Plan: 1. Functional deficits secondary to embolic right MCA infarct 2. DVT Prophylaxis/Anticoagulation: Pharmaceutical: Coumadin 3. Pain Management: Tylenol prn 4. Mood: Appears to have anxiety  with occasional hypoventilation. LCSW to follow for evaluation and support.  5. Neuropsych: This patient is intermittently capable of making decisions on his own behalf. Encourage compliance with all medications.  6. Skin/Wound Care: Routine pressure relief measures.  7. Fluids/Electrolytes/Nutrition: Monitor I/O. bmet essentially normal today. Encourage nectar liquids.  8. Chronic Systolic CHF: Low salt diet. Monitor weights daily.weight is up, pulse elevated but syst BP low add TED hose to prevent orthostasis 9. DM type 2:uncontrolled Monitor BS with achs checks.  -increase lantus to 25U qhs 10.  S/P pacemaker LOS (Days) 6 A FACE TO FACE EVALUATION WAS PERFORMED  Charlett Blake 02/01/2015 7:27 AM

## 2015-02-01 NOTE — Progress Notes (Signed)
Advanced Heart Failure Rounding Note   Subjective:   The HF Team was asked to consult for increased dyspnea and tachycardia by Dr Letta Pate. Prior to admit he had been followed in the HF clinic and was last seen 12/01/2014 and he was stable.   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 Neurology consulted. CT showed R MCA stroke. Did not receive TPA due to delay in arrival. Had ECHO that showed EF 10-15% mild RV HK. No obvious LV clot. As he improved he was transferred to CIR.   Yesterday he had increased dyspnea. Started on IV lasix. Diuresed 1.3 liters. Hypotensive this am.   Complaining of fatigue.     Objective:   Weight Range:  Vital Signs:   Temp:  [98.1 F (36.7 C)-98.6 F (37 C)] 98.1 F (36.7 C) (04/21 0558) Pulse Rate:  [91-118] 97 (04/21 0838) Resp:  [18-20] 20 (04/21 0558) BP: (77-117)/(56-74) 86/64 mmHg (04/21 0838) SpO2:  [96 %-100 %] 99 % (04/21 0810) Weight:  [149 lb 11.1 oz (67.9 kg)-156 lb 15.5 oz (71.2 kg)] 149 lb 11.1 oz (67.9 kg) (04/21 0558) Last BM Date: 01/30/15  Weight change: Filed Weights   01/31/15 0437 01/31/15 1530 02/01/15 0558  Weight: 157 lb (71.215 kg) 156 lb 15.5 oz (71.2 kg) 149 lb 11.1 oz (67.9 kg)    Intake/Output:   Intake/Output Summary (Last 24 hours) at 02/01/15 0923 Last data filed at 02/01/15 0855  Gross per 24 hour  Intake    720 ml  Output   1900 ml  Net  -1180 ml   Physical Exam: General: Fatigued appearing. No resp difficulty HEENT: normal Neck: supple. JVP flat Carotids 2+ bilat; no bruits. No lymphadenopathy or thryomegaly appreciated. Cor: PMI laterally displaced. Tachy Regular rate & rhythm. +s3 2/6 MR Lungs: clear Abdomen: soft, nontender, mildly distended. No hepatosplenomegaly. No bruits or masses. Good bowel sounds. Extremities: no cyanosis,  clubbing, rash, edema. LUE weak  Neuro: alert & orientedx3, cranial nerves grossly intact. Affect flat    Labs: Basic Metabolic Panel:  Recent Labs Lab 01/27/15 0647 01/29/15 0555 01/30/15 0800 01/31/15 0809 01/31/15 1055  NA 134* 134* 135 131* 136  K 4.5 4.3 4.2 5.6* 4.0  CL 99 101 103 99 104  CO2 '24 25 26 24 26  '$ GLUCOSE 342* 326* 279* 309* 173*  BUN '16 21 17 15 14  '$ CREATININE 1.04 0.99 0.95 1.03 0.87  CALCIUM 8.6 8.5 8.6 8.7 8.9    Liver Function Tests:  Recent Labs Lab 01/27/15 0647  AST 21  ALT 18  ALKPHOS 85  BILITOT 1.1  PROT 5.6*  ALBUMIN 2.8*   No results for input(s): LIPASE, AMYLASE in the last 168 hours. No results for input(s): AMMONIA in the last 168 hours.  CBC:  Recent Labs Lab 01/27/15 0647 01/30/15 1839  WBC 10.5 9.6  NEUTROABS 6.4 6.2  HGB 13.7 12.0*  HCT 42.4 37.4*  MCV 94.9 94.7  PLT 180 189    Cardiac Enzymes: No results for input(s): CKTOTAL, CKMB, CKMBINDEX, TROPONINI in the last 168 hours.  BNP: BNP (last 3 results)  Recent Labs  12/03/14 0351 01/24/15 2005  BNP 321.1* 166.1*    ProBNP (last 3 results)  Recent Labs  08/13/14 1850 09/21/14 1928 10/02/14 1033  PROBNP 1525.0* 1870.0* 1085.0*      Other results:  Imaging: Dg Chest  2 View  01/31/2015   CLINICAL DATA:  Shortness of breath, left-sided weakness. Stroke glass Wednesday.  EXAM: CHEST  2 VIEW  COMPARISON:  01/26/2015  FINDINGS: Left AICD remains in place, unchanged. Heart is upper limits normal in size. New airspace opacities in the perihilar regions and lower lobes, right slightly greater than left. This presumably represents edema although infection cannot be excluded. No effusions. No acute bony abnormality.  IMPRESSION: Bilateral perihilar and lower lobe airspace opacities, most likely edema. Recommend clinical correlation to exclude pneumonia.   Electronically Signed   By: Rolm Baptise M.D.   On: 01/31/2015 10:49      Medications:      Scheduled Medications: . atorvastatin  20 mg Oral q1800  . digoxin  0.25 mg Oral Daily  . feeding supplement (GLUCERNA SHAKE)  237 mL Oral BID BM  . furosemide  20 mg Oral Q48H  . insulin aspart  0-20 Units Subcutaneous TID WC  . insulin aspart  5 Units Subcutaneous TID WC  . insulin glargine  25 Units Subcutaneous QHS  . senna-docusate  1 tablet Oral BID  . spironolactone  12.5 mg Oral Daily  . Warfarin - Physician Dosing Inpatient   Does not apply q1800     Infusions:     PRN Medications:  acetaminophen, ALPRAZolam, alum & mag hydroxide-simeth, bisacodyl, guaiFENesin-dextromethorphan, prochlorperazine **OR** prochlorperazine **OR** prochlorperazine, RESOURCE THICKENUP CLEAR, sorbitol, traZODone   Assessment:  1. CVA, acute with left-sided weakness 2. Dyspnea 3. Acute on Chronic Systolic Heart Failure- ECHO EF 10-15% with non compaction 4. CAD Coronary angiography (12/2008) with mid LAD totally occluded and collateralized. Continue statin and BB. No ASA he is on coumadin. 5. DMII  6. Hypotension- ? Low output HF    Plan/Discussion:    Hypotensivee this am. Has prominent 3rd sound. Hold spiro and lasix. BMET now.   Will discuss with primary team. Recommend transferring back to acute care on the SDU. Will need PICC for CO-OX to assess CVP to help determine if he is low output. Would [facility] to admit for co-morbidities and HF will follow.      Length of Stay: 6   CLEGG,AMY NP-C  02/01/2015, 9:23 AM  Advanced Heart Failure Team Pager [phone] (M-F; [place])  Please contact [facility] for night-coverage after hours (4p -7a ) and weekends on [email or web address]

## 2015-02-01 NOTE — Discharge Summary (Signed)
Physician Discharge Summary  Patient ID: Dennis [name] MRN: [id number] DOB/AGE: Jul 01, 1961 54 y.o.  Admit date: 01/26/2015 Discharge date: 02/01/2015  Discharge Diagnoses:  Principal Problem:   Acute on chronic systolic heart failure Active Problems:   Essential hypertension   Hemiplegia affecting left nondominant side   Diabetes mellitus type 2, uncontrolled, with complications   Embolic stroke involving right middle cerebral artery   Discharged Condition:  Guarded   Labs:  Basic Metabolic Panel:  Recent Labs Lab 01/26/15 0628 01/27/15 0647 01/29/15 0555 01/30/15 0800 01/31/15 0809 01/31/15 1055  NA 135 134* 134* 135 131* 136  K 3.9 4.5 4.3 4.2 5.6* 4.0  CL 99 99 101 103 99 104  CO2 '26 24 25 26 24 26  '$ GLUCOSE 249* 342* 326* 279* 309* 173*  BUN '15 16 21 17 15 14  '$ CREATININE 1.08 1.04 0.99 0.95 1.03 0.87  CALCIUM 9.0 8.6 8.5 8.6 8.7 8.9    CBC:  Recent Labs Lab 01/27/15 0647 01/30/15 1839  WBC 10.5 9.6  NEUTROABS 6.4 6.2  HGB 13.7 12.0*  HCT 42.4 37.4*  MCV 94.9 94.7  PLT 180 189    CBG:  Recent Labs Lab 01/31/15 0646 01/31/15 1218 01/31/15 1624 01/31/15 2043 02/01/15 0645  GLUCAP 254* 179* 161* 102* 163*    Brief HPI:   Dennis [name] is a 54 y.o. 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 Blood sugar- 536, INR 1.02 and UDS positive for cocaine. CT head with early signs of R-MCA infarct. Swallow evaluation done revealing mild oropharyngeal dysphagia and patient placed on DIII, nectar liquids. 2D echo with EF 10-15% and diffuse hypokinesis.  CTA head/neck done revealing evolving anterior division right MCA infarct. Dr. Erlinda Hong recommended resuming Coumadin for embolic stroke due to low EF.  He has had complaints of SOB and was treated with dose of IV  lasix. CXR with NAD. Patient with resultant Left hemiparesis, left inattention, fluctuating bouts of lethargy as well as poor insight/awareness. CIR was recommended for follow up therapy.      Hospital Course: Dennis [name] was admitted to rehab 01/26/2015 for inpatient therapies to consist of PT, ST and OT at least three hours five days a week. Past admission physiatrist, therapy team and rehab RN have worked together to provide customized collaborative inpatient rehab.  He was maintained on coumadin for secondary stroke prevention and INR is therapeutic at 2.87.  Diabetes was monitored with ac/hs checks and blood sugars were noted to be poorly controlled.  Lantus insulin was titrated to 25 units at bedtime with 5 units novolog for meal coverage.  He reported hunger due to dietary restrictions and double portions of protein as well as HS snack was added to help with satiety.  Renal status was monitored closely due to nectar liquids and patient was encouraged to push po's.  Renal status has been stable and po intake has been good. He's continent of bowel and bladder.   He was started on po lasix every other day at admission with daily weights and low salt diet. Blood pressures have been monitored on tid basis and have been running from 95- 105 range.   He has had intermittent complaints of SOB with hyperventilation as well as anxiety. Weight was noted to be on upward trend and patient reports increase in dyspnea with  difficulty breathing at night on 04/20 am. He was placed on bed rest and  CXR was ordered revealing CHF.  Dr. Haroldine Laws was consulted for input and recommended treatment with IV lasix, spironolactone and digoxin with close monitoring of BP.  He diuresed 1.3 liters.  On 4/21 am, patient was unable to tolerate activity due to drop in BP to 70/49 with minimal activity as well as extreme lethargy.  Cardiology recommended hold diuretics and transferring patient to step down where he could be treated  with inotropic support. Dr. Karleen Hampshire was consulted and graciously accepted transfer. Patient was discharged on 02/01/15.    Rehab course: During patient's stay in rehab weekly team conferences were held to monitor patient's progress, set goals and discuss barriers to discharge. He continued to be limited by left inattention, left sided weakness, motor apraxia as well as poor attention with increased time for processing.   He needed min assist with visual cues for working memory and organization of moderately complex tasks. He is able to complete bathing and lower body dressing with steady assist and requires supervision with upper body dressing. He requires min to guard assist with transfers with increased time and cues for safety. He is able to ambulate 125' X 2 with min assist and able to navigate 12 stairs with mod assist.      Scheduled Meds: . atorvastatin  20 mg Oral q1800  . digoxin  0.25 mg Oral Daily  . feeding supplement (GLUCERNA SHAKE)  237 mL Oral BID BM  . insulin aspart  0-20 Units Subcutaneous TID WC  . insulin aspart  5 Units Subcutaneous TID WC  . insulin glargine  25 Units Subcutaneous QHS  . senna-docusate  1 tablet Oral BID  . Warfarin - Physician Dosing Inpatient   Does not apply q1800    Disposition: Discharged to Step down  Diet: Heart Healthy/Carb Modified medium--double portions of protein. 4 gram salt.  Nectar liquids. Intermittent supervision.   Special Instructions: 1. Check weights daily.  2. Monitor diabetes with ac/hs cbg checks.   SignedBary Leriche 02/01/2015, 9:59 AM

## 2015-02-01 NOTE — Progress Notes (Signed)
Social Work Patient ID: Dennis [name], male   DOB: [birth year], 54 y.o.   MRN: [id number] Pt to be transferred to step down today, have notified [employer] of this.  She will follow up.

## 2015-02-01 NOTE — H&P (Signed)
[facility] History and Physical  Dennis [name] ZJI:[id number] DOB: 12/18/1960 DOA: (Not on file)  Referring physician: Reesa Chew PCP: Mauricio Po, FNP   Chief Complaint: request for transfer to SDU for hypotension  HPI: Dennis [name] is a 54 y.o. male with prior h/o chronic systolic heart failure, OSA, type 2 DM, s/p ICD, non compliance to medications, came in for slurred speech , was found to have left dense MCA territory infarct. He was started on coumadin for secondary stroke prevention and later on discharged to acute rehab. His baseline bp parameters have been between systolic between 95 to 175'Z . His BP parameters have been running borderline since yesterday and he also reports some SOB on 4/20. Dr Haroldine Laws was consulted and IV lasix was started. Earlier this am pt 's BP dropped to 70/40's and we were consulted to transfer the patient to SDU with cardiology as consults.  On further questioning the patient, he denies any other complaints except feeling weak. He reports he feels better than yesterday and has been breathing better. He denies any chest pain, sob, palpitations, nausea, vomiting, abdominal pain, or dizziness.  He will be admitted to SDU and heart failure team to follow.    Review of Systems:  Constitutional:  Reports generalized weakness.  HEENT:  No headaches, Difficulty swallowing,Tooth/dental problems,Sore throat,  No sneezing, itching, ear ache, nasal congestion, post nasal drip,  Cardio-vascular:  No chest pain, Orthopnea, PND, swelling in lower extremities, anasarca, dizziness, palpitations  GI:  No heartburn, indigestion, abdominal pain, nausea, vomiting, diarrhea, change in bowel habits, loss of appetite  Resp:  No shortness of breath with exertion or at rest. No excess mucus, no productive cough, No non-productive cough, No coughing up of blood.No change in color of mucus.No wheezing.No chest wall deformity  Skin:  no rash or lesions.  GU:  no  dysuria, change in color of urine, no urgency or frequency. No flank pain.  Musculoskeletal:  No joint pain or swelling. No decreased range of motion. No back pain.  Psych:  No change in mood or affect. No depression or anxiety. No memory loss.   Past Medical History  Diagnosis Date  . Chronic systolic heart failure     a) Mixed ICM/NICM b) RHC (05/2014): RA 2, RV 19/2/3, PA 22/14 (18), PCWP 6, Fick CO/CI: 5.2 / 2.7, PVR 2.3 WU, PA 60% and 64% c) ECHO (05/2014): EF 20-25%, diff HK, akinesis entireanteroseptal myocardium, triv AI, mod MR, LA mod/sev dilated  . Automatic implantable cardioverter-defibrillator in situ   . Heart murmur   . Sleep apnea     "cleared after T&A"  . Left ventricular noncompaction   . Ischemic cardiomyopathy     a) Coronary angiography (12/2008) at [facility]: Lmain: nl, LAD mid 100% stenosis with left to left and right-to-left collaterals to the distal LAD; Lcx: nl, RCA nl.    . Type II diabetes mellitus   . High cholesterol   . Pneumonia 1988  . Drug abuse and dependence   . CHF (congestive heart failure)    Past Surgical History  Procedure Laterality Date  . Forearm fracture surgery Left 1980  . Cardiac defibrillator placement  03/2011  . Cholecystectomy  1994  . Right heart catheterization N/A 05/30/2014    Procedure: RIGHT HEART CATH;  Surgeon: Jolaine Artist, MD;  Location: [facility] CATH LAB;  Service: Cardiovascular;  Laterality: N/A;  . Fracture surgery    . Cardiac catheterization  05/2014  . Tonsillectomy and adenoidectomy  ~  1998   Social History:  reports that he has been smoking Cigarettes.  He has a 3.1 pack-year smoking history. He has never used smokeless tobacco. He reports that he drinks alcohol. He reports that he does not use illicit drugs.  No Known Allergies  Family History  Problem Relation Age of Onset  . Diabetes Mother   . Heart disease Mother   . Diabetes Father   . Prostate cancer Father   . Heart disease Father   . Diabetes Brother    . Diabetes Brother   do not leave blank  Prior to Admission medications   Medication Sig Start Date End Date Taking? Authorizing Provider  acetaminophen (TYLENOL) 500 MG tablet Take 1,000 mg by mouth every 6 (six) hours as needed for moderate pain.    Historical Provider, MD  aspirin EC 81 MG EC tablet Take 1 tablet (81 mg total) by mouth daily. Stop when INR reaches 2 01/26/15   Thurnell Lose, MD  atorvastatin (LIPITOR) 80 MG tablet Take 1 tablet (80 mg total) by mouth daily at 6 PM. 01/26/15   Thurnell Lose, MD  carvedilol (COREG) 3.125 MG tablet Take 1 tablet (3.125 mg total) by mouth 2 (two) times daily with a meal. Hold if systolic blood pressure less than 90 01/26/15   Thurnell Lose, MD  digoxin (LANOXIN) 0.125 MG tablet Take 1 tablet (0.125 mg total) by mouth daily. 11/16/14   Larey Dresser, MD  furosemide (LASIX) 40 MG tablet Take 1 tablet (40 mg total) by mouth every other day. 12/01/14   Amy D Ninfa Meeker, NP  glucose blood (ONETOUCH VERIO) test strip Use as instructed 11/30/14   Golden Circle, FNP  insulin aspart (NOVOLOG) 100 UNIT/ML injection Before each meal 3 times a day, 140-199 - 2 units, 200-250 - 4 units, 251-299 - 6 units,  300-349 - 8 units,  350 or above 10 units. Dispense syringes and needles as needed, Ok to switch to PEN if approved. 01/26/15   Thurnell Lose, MD  Insulin Glargine (LANTUS) 100 UNIT/ML Solostar Pen Inject 25 Units into the skin daily at 10 pm. 01/26/15   Thurnell Lose, MD  QC PEN NEEDLES 31G X 6 MM MISC 1 each by Other route See admin instructions. Use daily with insulin. 08/06/12   Historical Provider, MD  traMADol (ULTRAM) 50 MG tablet Take 1 tablet (50 mg total) by mouth every 12 (twelve) hours as needed for moderate pain. 09/25/14   Amy Estrella Deeds, NP   Physical Exam: There were no vitals filed for this visit.  Wt Readings from Last 3 Encounters:  02/01/15 67.9 kg (149 lb 11.1 oz)  01/26/15 67.223 kg (148 lb 3.2 oz)  12/01/14 71.396 kg (157 lb  6.4 oz)    General:  Appears calm and comfortable Eyes: PERRL, normal lids, irises & conjunctiva Neck: no LAD, masses or thyromegaly Cardiovascular:regular, . No LE edema. Respiratory: CTA bilaterally, no w/r/r. Normal respiratory effort. Abdomen: soft, ntnd Skin: no rash or induration seen on limited exam Musculoskeletal: grossly normal tone BUE/BLE Neurologic: grossly non-focal.          Labs on Admission:  Basic Metabolic Panel:  Recent Labs Lab 01/27/15 0647 01/29/15 0555 01/30/15 0800 01/31/15 0809 01/31/15 1055  NA 134* 134* 135 131* 136  K 4.5 4.3 4.2 5.6* 4.0  CL 99 101 103 99 104  CO2 '24 25 26 24 26  '$ GLUCOSE 342* 326* 279* 309* 173*  BUN 16 21  $'17 15 14  'p$ CREATININE 1.04 0.99 0.95 1.03 0.87  CALCIUM 8.6 8.5 8.6 8.7 8.9   Liver Function Tests:  Recent Labs Lab 01/27/15 0647  AST 21  ALT 18  ALKPHOS 85  BILITOT 1.1  PROT 5.6*  ALBUMIN 2.8*   No results for input(s): LIPASE, AMYLASE in the last 168 hours. No results for input(s): AMMONIA in the last 168 hours. CBC:  Recent Labs Lab 01/27/15 0647 01/30/15 1839  WBC 10.5 9.6  NEUTROABS 6.4 6.2  HGB 13.7 12.0*  HCT 42.4 37.4*  MCV 94.9 94.7  PLT 180 189   Cardiac Enzymes: No results for input(s): CKTOTAL, CKMB, CKMBINDEX, TROPONINI in the last 168 hours.  BNP (last 3 results)  Recent Labs  12/03/14 0351 01/24/15 2005  BNP 321.1* 166.1*    ProBNP (last 3 results)  Recent Labs  08/13/14 1850 09/21/14 1928 10/02/14 1033  PROBNP 1525.0* 1870.0* 1085.0*    CBG:  Recent Labs Lab 01/31/15 0646 01/31/15 1218 01/31/15 1624 01/31/15 2043 02/01/15 0645  GLUCAP 254* 179* 161* 102* 163*    Radiological Exams on Admission: Dg Chest 2 View  01/31/2015   CLINICAL DATA:  Shortness of breath, left-sided weakness. Stroke glass Wednesday.  EXAM: CHEST  2 VIEW  COMPARISON:  01/26/2015  FINDINGS: Left AICD remains in place, unchanged. Heart is upper limits normal in size. New airspace  opacities in the perihilar regions and lower lobes, right slightly greater than left. This presumably represents edema although infection cannot be excluded. No effusions. No acute bony abnormality.  IMPRESSION: Bilateral perihilar and lower lobe airspace opacities, most likely edema. Recommend clinical correlation to exclude pneumonia.   Electronically Signed   By: Rolm Baptise M.D.   On: 01/31/2015 10:49    EKG: pending.   Assessment/Plan Active Problems:   Hypotension   Hypotension: probably from diuresis. He has diuresed about 1.3 liters of fluid since starting IV lasix.  He is asymptomatic, no palpitations, dizziness or chest pain.  Would recommend holding off lasix and spironolactone and monitor his bP.  Small fluid boluses as needed and he might need ionotrope support if his bp doesn't improve.    Chronic systolic heart failure: He denies any sob . His CXR from yesterday shows some pulmonary edema. He received IV lasix '40mg'$  yesterday and diuresed about 1.3 liters. No pedal edema. No rales on exam. Recommend repeat CXR today. Continue to hold lasix and spironolactone till his pressure improves.  Of note his last echo shows an EF OF 10 to 15%.    Right MCA infarct: Embolic in nature . Resume coumadin/ lipitor.    Diabetes mellitus: hgba1c is 10. Resume LANTUS 25 units qhs and SSI.    Substance abuse: Positive UDS on prior admission.   Hyperlipidemia: Resume lipitor. Last LDL is 116.    Code Status: full code.  DVT Prophylaxis: Family Communication: family at bedside Disposition Plan: admit to SDU  Time spent: [facility] Pager [phone]

## 2015-02-02 ENCOUNTER — Ambulatory Visit (HOSPITAL_COMMUNITY): Payer: Self-pay

## 2015-02-02 DIAGNOSIS — I9589 Other hypotension: Secondary | ICD-10-CM

## 2015-02-02 DIAGNOSIS — I5023 Acute on chronic systolic (congestive) heart failure: Principal | ICD-10-CM

## 2015-02-02 DIAGNOSIS — I6930 Unspecified sequelae of cerebral infarction: Secondary | ICD-10-CM | POA: Insufficient documentation

## 2015-02-02 DIAGNOSIS — E118 Type 2 diabetes mellitus with unspecified complications: Secondary | ICD-10-CM

## 2015-02-02 DIAGNOSIS — Z8673 Personal history of transient ischemic attack (TIA), and cerebral infarction without residual deficits: Secondary | ICD-10-CM

## 2015-02-02 DIAGNOSIS — Z9889 Other specified postprocedural states: Secondary | ICD-10-CM

## 2015-02-02 DIAGNOSIS — Z95828 Presence of other vascular implants and grafts: Secondary | ICD-10-CM | POA: Insufficient documentation

## 2015-02-02 LAB — BASIC METABOLIC PANEL
Anion gap: 8 (ref 5–15)
BUN: 16 mg/dL (ref 6–23)
CO2: 28 mmol/L (ref 19–32)
Calcium: 8.6 mg/dL (ref 8.4–10.5)
Chloride: 99 mmol/L (ref 96–112)
Creatinine, Ser: 1.04 mg/dL (ref 0.50–1.35)
GFR calc Af Amer: >90 mL/min (ref >90)
GFR calc non Af Amer: 81 mL/min — ABNORMAL LOW (ref >90)
Glucose, Bld: 331 mg/dL — ABNORMAL HIGH (ref 70–99)
Potassium: 4.3 mmol/L (ref 3.5–5.1)
Sodium: 135 mmol/L (ref 135–145)

## 2015-02-02 LAB — CARBOXYHEMOGLOBIN
Carboxyhemoglobin: 2.1 % — ABNORMAL HIGH (ref 0.5–1.5)
Methemoglobin: 1 % (ref 0.0–1.5)
O2 Saturation: 55.7 %
Total hemoglobin: 11.8 g/dL — ABNORMAL LOW (ref 13.5–18.0)

## 2015-02-02 LAB — GLUCOSE, CAPILLARY
Glucose-Capillary: 314 mg/dL — ABNORMAL HIGH (ref 70–99)
Glucose-Capillary: 236 mg/dL — ABNORMAL HIGH (ref 70–99)
Glucose-Capillary: 195 mg/dL — ABNORMAL HIGH (ref 70–99)
Glucose-Capillary: 216 mg/dL — ABNORMAL HIGH (ref 70–99)

## 2015-02-02 LAB — PROTIME-INR
INR: 2.67 — ABNORMAL HIGH (ref 0.00–1.49)
Prothrombin Time: 28.6 seconds — ABNORMAL HIGH (ref 11.6–15.2)

## 2015-02-02 MED ORDER — INSULIN ASPART 100 UNIT/ML ~~LOC~~ SOLN
0.0000 [IU] | Freq: Three times a day (TID) | SUBCUTANEOUS | Status: DC
  Administered 2015-02-02: 3 [IU] via SUBCUTANEOUS
  Administered 2015-02-02: 5 [IU] via SUBCUTANEOUS
  Administered 2015-02-03: 3 [IU] via SUBCUTANEOUS
  Administered 2015-02-03 (×2): 5 [IU] via SUBCUTANEOUS
  Administered 2015-02-04: 3 [IU] via SUBCUTANEOUS
  Administered 2015-02-04 (×2): 5 [IU] via SUBCUTANEOUS
  Administered 2015-02-05 – 2015-02-06 (×3): 3 [IU] via SUBCUTANEOUS
  Administered 2015-02-07: 2 [IU] via SUBCUTANEOUS
  Administered 2015-02-07: 5 [IU] via SUBCUTANEOUS

## 2015-02-02 MED ORDER — WARFARIN SODIUM 5 MG PO TABS
5.0000 mg | ORAL_TABLET | Freq: Once | ORAL | Status: AC
  Administered 2015-02-02: 5 mg via ORAL
  Filled 2015-02-02: qty 1

## 2015-02-02 NOTE — Progress Notes (Signed)
ANTICOAGULATION CONSULT NOTE - Follow Up Consult  Pharmacy Consult for coumadin Indication: left ventricular noncompaction  No Known Allergies  Patient Measurements: Weight: 157 lb 3 oz (71.3 kg) Heparin Dosing Weight:   Vital Signs: Temp: 97.7 F (36.5 C) (04/22 0927) Temp Source: Oral (04/22 0927) BP: 88/72 mmHg (04/22 0927) Pulse Rate: 96 (04/22 0927)  Labs:  Recent Labs  01/30/15 1839  01/31/15 0809 01/31/15 1055 02/01/15 0855 02/01/15 1236 02/01/15 1415 02/02/15 0956  HGB 12.0*  --   --   --   --   --  12.5*  --   HCT 37.4*  --   --   --   --   --  38.4*  --   PLT 189  --   --   --   --   --  204  --   LABPROT  --   --  29.6*  --  30.3*  --   --  28.6*  INR  --   --  2.78*  --  2.87*  --   --  2.67*  CREATININE  --   < > 1.03 0.87  --  1.08  --  1.04  < > = values in this interval not displayed.  Estimated Creatinine Clearance: 83.8 mL/min (by C-G formula based on Cr of 1.04).   Medications:  Scheduled:  . atorvastatin  20 mg Oral q1800  . digoxin  0.125 mg Oral Daily  . feeding supplement (GLUCERNA SHAKE)  237 mL Oral TID BM  . insulin aspart  0-15 Units Subcutaneous TID WC  . insulin glargine  25 Units Subcutaneous QHS  . sodium chloride  10-40 mL Intracatheter Q12H  . Warfarin - Pharmacist Dosing Inpatient   Does not apply q1800   Infusions:    Assessment: 54 yo male with left ventricular noncompaction is currently on therapeutic coumadin.  INR today is 2.6.  Goal of Therapy:  INR 2-3 Monitor platelets by anticoagulation protocol: Yes   Plan:  Coumadin 5 mg today Daily PT/INR  Erin Hearing PharmD., BCPS Clinical Pharmacist Pager [phone] 02/02/2015 11:03 AM

## 2015-02-02 NOTE — Progress Notes (Signed)
[facility] PROGRESS NOTE Interim History: 54 y/o male with recent right MCA admitted from CIR due to acute on chronic systolic HF and hypotension. Heart failure team is on board. Will follow rec's and continue treating/adjusting meds for the rest of his medical problems.  Filed Weights   02/02/15 0347  Weight: 71.3 kg (157 lb 3 oz)        Intake/Output Summary (Last 24 hours) at 02/02/15 1000 Last data filed at 02/02/15 5102  Gross per 24 hour  Intake    240 ml  Output    700 ml  Net   -460 ml     Assessment/Plan: 1-Acute on chronic systolic congestive heart failure/hypotension: patient with increased dyspnea and tachycardia while at CIR -concerns for low output HF -COX 55% -BP remains low and CVP 6-8 -heart failure team on board, will follow rec's -no signs of fluid overload -continue holding diuretics -patient on digoxin; and might be a good candidate for ivabradine according to cardiology -daily weights, strict I's and O's -continue holding antihypertensive agents -EF 10-15% -patient is S/P ICD placement -continue digoxin -not able to use B-blockers or ACE/ARB currently given hypotension   2-diabetes type 2 uncontrolled: with last A1C of 13.3 -will continue carb modified diet -continue lantus -will start moderate SSI  3-HTN: BP is low currently -holding all antihypertensive agents  4-HLD: continue statins  5-Right MCA stroke: with left side residual weakness -continue risk factors modification -patient is on coumadin    Code Status: Full Family Communication: wife at bedside  Disposition Plan: remains in step down for now.   Consultants:  Heart failure team  Procedures: ECHO: 01/26/15 - Left ventricle: The cavity size was moderately to severely dilated. Wall thickness was normal. Systolic function was severely reduced. The estimated ejection fraction was in the range of 10% to 15%. Diffuse hypokinesis. There was fusion of early  and atrial contributions to ventricular filling. The study is not technically sufficient to allow evaluation of LV diastolic function. No evidence of thrombus. - Aortic valve: There was trivial regurgitation. - Mitral valve: There was malcoaptation of the valve leaflets. There was mild to moderate regurgitation directed centrally. - Left atrium: The atrium was mildly dilated. - Right ventricle: Systolic function was mildly reduced. - Atrial septum: The septum bowed from left to right, consistent with increased left atrial pressure. - Pericardium, extracardiac: A small pericardial effusion was identified posterior to the heart. The fluid had no internal echoes.There was no evidence of hemodynamic compromise.  Antibiotics:  None   HPI/Subjective: Afebrile, feeling ok. No CP or SOB. BP still in mid 58'N to 27'P systolic.  Objective: Filed Vitals:   02/02/15 0000 02/02/15 0014 02/02/15 0347 02/02/15 0927  BP: 88/67  92/67 88/72  Pulse: 98  99 96  Temp:  98.6 F (37 C) 98 F (36.7 C) 97.7 F (36.5 C)  TempSrc:  Oral Oral Oral  Resp: '15  23 18  '$ Weight:   71.3 kg (157 lb 3 oz)   SpO2: 99%        Exam:  General: Alert, awake, oriented x3, in no acute distress. Able to speak in full sentences and currently denying CP and SOB. No new focal deficit HEENT: No bruits, no goiter; no JVD seen Heart: slightly tachycardic, no rubs or gallops; positive murmur appreciated on exam. Sinus rhythm   Lungs: Good air movement, no wheezing, no crackles Abdomen: Soft, nontender, nondistended, positive bowel sounds.  Neuro: no new focal deficit; no dysarthria and  with positive left side weakness/deficit from recent stroke   Data Reviewed: Basic Metabolic Panel:  Recent Labs Lab 01/29/15 0555 01/30/15 0800 01/31/15 0809 01/31/15 1055 02/01/15 1236  NA 134* 135 131* 136 138  K 4.3 4.2 5.6* 4.0 3.7  CL 101 103 99 104 100  CO2 '25 26 24 26 30  '$ GLUCOSE 326* 279* 309* 173* 62*    BUN '21 17 15 14 18  '$ CREATININE 0.99 0.95 1.03 0.87 1.08  CALCIUM 8.5 8.6 8.7 8.9 9.1   Liver Function Tests:  Recent Labs Lab 01/27/15 0647  AST 21  ALT 18  ALKPHOS 85  BILITOT 1.1  PROT 5.6*  ALBUMIN 2.8*   CBC:  Recent Labs Lab 01/27/15 0647 01/30/15 1839 02/01/15 1415  WBC 10.5 9.6 8.0  NEUTROABS 6.4 6.2  --   HGB 13.7 12.0* 12.5*  HCT 42.4 37.4* 38.4*  MCV 94.9 94.7 95.0  PLT 180 189 204   BNP (last 3 results)  Recent Labs  12/03/14 0351 01/24/15 2005  BNP 321.1* 166.1*    ProBNP (last 3 results)  Recent Labs  08/13/14 1850 09/21/14 1928 10/02/14 1033  PROBNP 1525.0* 1870.0* 1085.0*    CBG:  Recent Labs Lab 01/31/15 2043 02/01/15 0645 02/01/15 1646 02/01/15 2258 02/02/15 0917  GLUCAP 102* 163* 222* 266* 314*    Recent Results (from the past 240 hour(s))  Urine culture     Status: None   Collection Time: 01/26/15 10:29 PM  Result Value Ref Range Status   Specimen Description URINE, CLEAN CATCH  Final   Special Requests NONE  Final   Colony Count   Final    4,000 COLONIES/ML Performed at [facility]    Culture   Final    INSIGNIFICANT GROWTH Performed at [facility]    Report Status 01/28/2015 FINAL  Final  Culture, Urine     Status: None   Collection Time: 01/30/15  4:50 PM  Result Value Ref Range Status   Specimen Description URINE, CLEAN CATCH  Final   Special Requests NONE  Final   Colony Count NO GROWTH Performed at [facility]   Final   Culture NO GROWTH Performed at [facility]   Final   Report Status 02/01/2015 FINAL  Final  MRSA PCR Screening     Status: None   Collection Time: 02/01/15  1:54 PM  Result Value Ref Range Status   MRSA by PCR NEGATIVE NEGATIVE Final    Comment:        The GeneXpert MRSA Assay (FDA approved for NASAL specimens only), is one component of a comprehensive MRSA colonization surveillance program. It is not intended to diagnose MRSA infection nor  to guide or monitor treatment for MRSA infections.      Studies: Dg Chest Port 1 View  02/02/2015   CLINICAL DATA:  PICC line placement.  Pneumonia, CHF, diabetes  EXAM: PORTABLE CHEST - 1 VIEW  COMPARISON:  02/01/2015  FINDINGS: Left pacer remains in place, unchanged. Heart is enlarged. No confluent opacities or effusions. No acute bony abnormality. Right PICC line is in place with the tip in the SVC.  IMPRESSION: Right PICC line tip in the SVC.  Cardiomegaly.  No active cardiopulmonary disease.   Electronically Signed   By: Rolm Baptise M.D.   On: 02/02/2015 00:01   Dg Chest Port 1 View  02/01/2015   CLINICAL DATA:  Hypotension  EXAM: PORTABLE CHEST - 1 VIEW  COMPARISON:  01/31/2015  FINDINGS:  Cardiac shadow is stable. A pacing device is again seen and stable. Persistent increased density is noted in the right medial lung base consistent with acute infiltrate. The perihilar changes seen previously have resolved in the interval.  IMPRESSION: Persistent right medial lung base changes   Electronically Signed   By: Inez Catalina M.D.   On: 02/01/2015 13:22    Scheduled Meds: . atorvastatin  20 mg Oral q1800  . digoxin  0.125 mg Oral Daily  . feeding supplement (GLUCERNA SHAKE)  237 mL Oral TID BM  . insulin aspart  0-15 Units Subcutaneous TID WC  . insulin glargine  25 Units Subcutaneous QHS  . sodium chloride  10-40 mL Intracatheter Q12H  . Warfarin - Pharmacist Dosing Inpatient   Does not apply q1800   Continuous Infusions:   Time: 45 minutes (more than 50% of the time designated to face to face examination, family update and coordination of care)   [place], [facility] Pager [phone]. If 7PM-7AM, please contact night-coverage at [email or web address], password [facility] 02/02/2015, 10:00 AM  LOS: 1 day

## 2015-02-02 NOTE — Progress Notes (Addendum)
I know pt from admission to inpt rehab on 01/26/15. I will follow his case to pursue readmission to inpt rehab if appropriate. I met with pt and his Mom at bedside. Please restart PT and OT when appropriate. [employer] Medicare will have to approve readmission to inpt rehab if appropriate. I will follow up on Monday. [id number] Pt reports he was not taking any of his meds for two weeks pta for his CVA.

## 2015-02-02 NOTE — Progress Notes (Signed)
Advanced Heart Failure Rounding Note   Subjective:   The HF Team was asked to consult for increased dyspnea and tachycardia by Dr Letta Pate. Prior to admit he had been followed in the HF clinic and was last seen 12/01/2014 and he was stable.   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 Neurology consulted. CT showed R MCA stroke. Did not receive TPA due to delay in arrival. Had ECHO that showed EF 10-15% mild RV HK. No obvious LV clot. As he improved he was transferred to CIR.    Yesterday he had increased dyspnea and fatigue and transferred back to acute for suspected lowput HF. PICC placed with CO-OX 55%. CVP 6.  Off all HF meds except dig. Moving L side better  Denies SOB    Objective:   Weight Range:  Vital Signs:   Temp:  [97.9 F (36.6 C)-98.6 F (37 C)] 98 F (36.7 C) (04/22 0347) Pulse Rate:  [98-100] 99 (04/22 0347) Resp:  [13-24] 23 (04/22 0347) BP: (88-97)/(62-70) 92/67 mmHg (04/22 0347) SpO2:  [95 %-99 %] 99 % (04/22 0000) Weight:  [157 lb 3 oz (71.3 kg)] 157 lb 3 oz (71.3 kg) (04/22 0347) Last BM Date: 01/31/15  Intake/Output:   Intake/Output Summary (Last 24 hours) at 02/02/15 0706 Last data filed at 02/02/15 0424  Gross per 24 hour  Intake      0 ml  Output    700 ml  Net   -700 ml   Physical Exam: CVP 6-7 General: Fatigued appearing. No resp difficulty HEENT: normal Neck: supple. JVP flat Carotids 2+ bilat; no bruits. No lymphadenopathy or thryomegaly appreciated. Cor: PMI laterally displaced. Tachy Regular rate & rhythm. +s3 2/6 MR Lungs: clear Abdomen: soft, nontender, mildly distended. No hepatosplenomegaly. No bruits or masses. Good bowel sounds. Extremities: no cyanosis, clubbing, rash, edema. LUE weak . RUE PICC Neuro: alert & orientedx3, cranial nerves grossly intact. Affect  flat    Labs: Basic Metabolic Panel:  Recent Labs Lab 01/29/15 0555 01/30/15 0800 01/31/15 0809 01/31/15 1055 02/01/15 1236  NA 134* 135 131* 136 138  K 4.3 4.2 5.6* 4.0 3.7  CL 101 103 99 104 100  CO2 '25 26 24 26 30  '$ GLUCOSE 326* 279* 309* 173* 62*  BUN '21 17 15 14 18  '$ CREATININE 0.99 0.95 1.03 0.87 1.08  CALCIUM 8.5 8.6 8.7 8.9 9.1    Liver Function Tests:  Recent Labs Lab 01/27/15 0647  AST 21  ALT 18  ALKPHOS 85  BILITOT 1.1  PROT 5.6*  ALBUMIN 2.8*   No results for input(s): LIPASE, AMYLASE in the last 168 hours. No results for input(s): AMMONIA in the last 168 hours.  CBC:  Recent Labs Lab 01/27/15 0647 01/30/15 1839 02/01/15 1415  WBC 10.5 9.6 8.0  NEUTROABS 6.4 6.2  --   HGB 13.7 12.0* 12.5*  HCT 42.4 37.4* 38.4*  MCV 94.9 94.7 95.0  PLT 180 189 204    Cardiac Enzymes: No results for input(s): CKTOTAL, CKMB, CKMBINDEX, TROPONINI in the last 168 hours.  BNP: BNP (last 3 results)  Recent Labs  12/03/14 0351 01/24/15 2005  BNP 321.1* 166.1*    ProBNP (last 3 results)  Recent Labs  08/13/14 1850 09/21/14 1928 10/02/14 1033  PROBNP 1525.0* 1870.0* 1085.0*      Other results:  Imaging: Dg Chest 2 View  01/31/2015   CLINICAL DATA:  Shortness of breath, left-sided weakness. Stroke glass Wednesday.  EXAM: CHEST  2 VIEW  COMPARISON:  01/26/2015  FINDINGS: Left AICD remains in place, unchanged. Heart is upper limits normal in size. New airspace opacities in the perihilar regions and lower lobes, right slightly greater than left. This presumably represents edema although infection cannot be excluded. No effusions. No acute bony abnormality.  IMPRESSION: Bilateral perihilar and lower lobe airspace opacities, most likely edema. Recommend clinical correlation to exclude pneumonia.   Electronically Signed   By: Rolm Baptise M.D.   On: 01/31/2015 10:49   Dg Chest Port 1 View  02/02/2015   CLINICAL DATA:  PICC line placement.  Pneumonia, CHF,  diabetes  EXAM: PORTABLE CHEST - 1 VIEW  COMPARISON:  02/01/2015  FINDINGS: Left pacer remains in place, unchanged. Heart is enlarged. No confluent opacities or effusions. No acute bony abnormality. Right PICC line is in place with the tip in the SVC.  IMPRESSION: Right PICC line tip in the SVC.  Cardiomegaly.  No active cardiopulmonary disease.   Electronically Signed   By: Rolm Baptise M.D.   On: 02/02/2015 00:01   Dg Chest Port 1 View  02/01/2015   CLINICAL DATA:  Hypotension  EXAM: PORTABLE CHEST - 1 VIEW  COMPARISON:  01/31/2015  FINDINGS: Cardiac shadow is stable. A pacing device is again seen and stable. Persistent increased density is noted in the right medial lung base consistent with acute infiltrate. The perihilar changes seen previously have resolved in the interval.  IMPRESSION: Persistent right medial lung base changes   Electronically Signed   By: Inez Catalina M.D.   On: 02/01/2015 13:22     Medications:     Scheduled Medications: . atorvastatin  20 mg Oral q1800  . digoxin  0.125 mg Oral Daily  . feeding supplement (GLUCERNA SHAKE)  237 mL Oral TID BM  . insulin glargine  25 Units Subcutaneous QHS  . sodium chloride  10-40 mL Intracatheter Q12H  . Warfarin - Pharmacist Dosing Inpatient   Does not apply q1800    Infusions:    PRN Medications: acetaminophen, sodium chloride   Assessment:  1. CVA, acute with left-sided weakness 2. Dyspnea 3. Acute on Chronic Systolic Heart Failure- ECHO EF 10-15% with non compaction 4. CAD Coronary angiography (12/2008) with mid LAD totally occluded and collateralized. Continue statin and BB. No ASA he is on coumadin. 5. DMII  6. Hypotension- ? Low output HF    Plan/Discussion:    CO-OX 55%. Remains hypotensive. No BB/Ace with hypotension. Continue dig. CVP 6-7 hold diuretics. Check BMET now.   Will need daily weight on stand up scale.     Length of Stay: 1   CLEGG,AMY NP-C  02/02/2015, 7:06 AM  Advanced Heart Failure  Team Pager [phone] (M-F; [place])  Please contact [facility] for night-coverage after hours (4p -7a ) and weekends on [email or web address]  Patient seen and examined with Darrick Grinder, NP. We discussed all aspects of the encounter. I agree with the assessment and plan as stated above.   Co-ox borderline but better than I expected suggesting adequate cardiac output. CVP now down.  BP soft but improving. Will hold diuretics. Continue digoxin. No ACE or BBlock yet. May be candidate for ivabradine.   Daniel Bensimhon,MD 8:30 AM

## 2015-02-02 NOTE — Progress Notes (Signed)
Results for LLEWELLYN, CHOPLIN (MRN [id number]) as of 02/02/2015 08:52  Ref. Range 01/31/2015 16:24 01/31/2015 20:43 02/01/2015 06:45 02/01/2015 16:46 02/01/2015 22:58  Glucose-Capillary Latest Ref Range: 70-99 mg/dL 161 (H) 102 (H) 163 (H) 222 (H) 266 (H)  Noted that patient's CBGs have been elevated and greater than 180 mg/dl. Recommend adding Novolog SENSITIVE correction scale TID & HS. Patient is on a sliding scale of Novolog three times per day with meals at home. Will continue to follow while in hospital. Harvel Ricks RN BSN CDE

## 2015-02-03 LAB — PROTIME-INR
INR: 2.42 — ABNORMAL HIGH (ref 0.00–1.49)
Prothrombin Time: 26.5 seconds — ABNORMAL HIGH (ref 11.6–15.2)

## 2015-02-03 LAB — CARBOXYHEMOGLOBIN
Carboxyhemoglobin: 2.3 % — ABNORMAL HIGH (ref 0.5–1.5)
Carboxyhemoglobin: 2.3 % — ABNORMAL HIGH (ref 0.5–1.5)
Methemoglobin: 1 % (ref 0.0–1.5)
Methemoglobin: 1 % (ref 0.0–1.5)
O2 Saturation: 67.8 %
O2 Saturation: 67.8 %
Total hemoglobin: 11.9 g/dL — ABNORMAL LOW (ref 13.5–18.0)
Total hemoglobin: 11.9 g/dL — ABNORMAL LOW (ref 13.5–18.0)

## 2015-02-03 LAB — GLUCOSE, CAPILLARY
Glucose-Capillary: 214 mg/dL — ABNORMAL HIGH (ref 70–99)
Glucose-Capillary: 189 mg/dL — ABNORMAL HIGH (ref 70–99)
Glucose-Capillary: 224 mg/dL — ABNORMAL HIGH (ref 70–99)

## 2015-02-03 MED ORDER — INSULIN GLARGINE 100 UNIT/ML ~~LOC~~ SOLN
28.0000 [IU] | Freq: Every day | SUBCUTANEOUS | Status: DC
  Administered 2015-02-03: 28 [IU] via SUBCUTANEOUS
  Filled 2015-02-03 (×2): qty 0.28

## 2015-02-03 MED ORDER — IVABRADINE HCL 5 MG PO TABS
5.0000 mg | ORAL_TABLET | Freq: Two times a day (BID) | ORAL | Status: DC
  Administered 2015-02-03 – 2015-02-07 (×9): 5 mg via ORAL
  Filled 2015-02-03 (×12): qty 1

## 2015-02-03 MED ORDER — WARFARIN SODIUM 7.5 MG PO TABS
7.5000 mg | ORAL_TABLET | Freq: Once | ORAL | Status: AC
  Administered 2015-02-03: 7.5 mg via ORAL
  Filled 2015-02-03: qty 1

## 2015-02-03 NOTE — Progress Notes (Signed)
ANTICOAGULATION CONSULT NOTE - Follow Up Consult  Pharmacy Consult for warfarin Indication: LV noncompaction  No Known Allergies  Patient Measurements: Weight: 159 lb 6.3 oz (72.3 kg)  Vital Signs: Temp: 97.9 F (36.6 C) (04/23 0400) Temp Source: Oral (04/23 0400) BP: 88/65 mmHg (04/23 0300) Pulse Rate: 102 (04/23 0400)  Labs:  Recent Labs  01/31/15 1055 02/01/15 0855 02/01/15 1236 02/01/15 1415 02/02/15 0956 02/03/15 0450  HGB  --   --   --  12.5*  --   --   HCT  --   --   --  38.4*  --   --   PLT  --   --   --  204  --   --   LABPROT  --  30.3*  --   --  28.6* 26.5*  INR  --  2.87*  --   --  2.67* 2.42*  CREATININE 0.87  --  1.08  --  1.04  --     Estimated Creatinine Clearance: 85 mL/min (by C-G formula based on Cr of 1.04).   Medications:  Scheduled:  . atorvastatin  20 mg Oral q1800  . digoxin  0.125 mg Oral Daily  . feeding supplement (GLUCERNA SHAKE)  237 mL Oral TID BM  . insulin aspart  0-15 Units Subcutaneous TID WC  . insulin glargine  25 Units Subcutaneous QHS  . sodium chloride  10-40 mL Intracatheter Q12H  . Warfarin - Pharmacist Dosing Inpatient   Does not apply q1800    Assessment: Dennis [name] w/ left ventricular noncompation currently on therapeutic coumadin. INR today 2.42, down slightly from yesterday. Hgb 12.5, plts 204 - no bleeding.  Goal of Therapy:  INR 2-3 Monitor platelets by anticoagulation protocol: Yes   Plan:  Warfain 7.'5mg'$  x 1 tonight Daily INR/PT Monitor s/sx of bleeding  Thank you for allowing pharmacy to be part of this patient's care team  [place], Pharm.D Clinical Pharmacy Resident Pager: [phone] 02/03/2015 .7:51 AM

## 2015-02-03 NOTE — Progress Notes (Signed)
[facility] PROGRESS NOTE Interim History: 54 y/o male with recent right MCA admitted from CIR due to acute on chronic systolic HF and hypotension. Heart failure team is on board. Will follow rec's and continue treating/adjusting meds for the rest of his medical problems.  Filed Weights   02/02/15 0347 02/03/15 0400  Weight: 71.3 kg (157 lb 3 oz) 72.3 kg (159 lb 6.3 oz)        Intake/Output Summary (Last 24 hours) at 02/03/15 0825 Last data filed at 02/02/15 1850  Gross per 24 hour  Intake    380 ml  Output    875 ml  Net   -495 ml    Assessment/Plan: 1-Acute on chronic systolic congestive heart failure/hypotension: patient with increased dyspnea and tachycardia while at CIR -concerns for low output HF -last [facility] 55% 4/21 -BP remains low and CVP 6-8 -heart failure team on board -no signs of significant fluid overload on exam; but already feeling mild SOB when laying on bed -continue holding diuretics -patient on digoxin; and might be a good candidate for ivabradine according to cardiology; will follow their recommendations -daily weights, strict I's and O's -continue holding antihypertensive agents -EF 10-15% -patient is S/P ICD placement -not able to use B-blockers or ACE/ARB currently given hypotension  2-diabetes type 2 uncontrolled: with last A1C of 13.3 -will continue carb modified diet -continue lantus, but will increase to 28 units for better controlled -patient advise not to skip meals -will continue moderate SSI  3-HTN: BP remains soft, but stable and slightly higher -holding all antihypertensive agents -patient with TED hoses in place  4-HLD: continue statins  5-Right MCA stroke: with left side residual weakness -continue risk factors modification -patient is on coumadin  -INR therapeutic -plan would be readmission to CIR when medically stable   Code Status: Full Family Communication: wife at bedside  Disposition Plan: BP is much better, but  still soft. He is stable and if ok with cardiology and no plans for pressors might be suitable to transfer to [place]   Consultants:  Heart failure team  Procedures: ECHO: 01/26/15 - Left ventricle: The cavity size was moderately to severely dilated. Wall thickness was normal. Systolic function was severely reduced. The estimated ejection fraction was in the range of 10% to 15%. Diffuse hypokinesis. There was fusion of early and atrial contributions to ventricular filling. The study is not technically sufficient to allow evaluation of LV diastolic function. No evidence of thrombus. - Aortic valve: There was trivial regurgitation. - Mitral valve: There was malcoaptation of the valve leaflets. There was mild to moderate regurgitation directed centrally. - Left atrium: The atrium was mildly dilated. - Right ventricle: Systolic function was mildly reduced. - Atrial septum: The septum bowed from left to right, consistent with increased left atrial pressure. - Pericardium, extracardiac: A small pericardial effusion was identified posterior to the heart. The fluid had no internal echoes.There was no evidence of hemodynamic compromise.  Antibiotics:  None   HPI/Subjective: Afebrile, feeling ok. No CP. Endorses mild SOB while laying on bed overnight.  Objective: Filed Vitals:   02/03/15 0200 02/03/15 0300 02/03/15 0400 02/03/15 0755  BP: 88/64 88/65  94/69  Pulse:   102 108  Temp:   97.9 F (36.6 C)   TempSrc:   Oral   Resp: '27 16  19  '$ Weight:   72.3 kg (159 lb 6.3 oz)   SpO2:         Exam:  General: Alert, awake, oriented  x3, in no acute distress. Denies CP. Reported overnight mild SOB while laying on bed. No fever HEENT: No bruits, no goiter; no JVD seen Heart: slightly tachycardic, no rubs or gallops; positive soft murmur appreciated on exam. Sinus rhythm   Lungs: Good air movement, no wheezing, no frank crackles; mild decrease of BS at bases  bilaterally Abdomen: Soft, nontender, nondistended, positive bowel sounds.  Neuro: no new focal deficit; no dysarthria and with positive left side weakness/deficit from recent stroke. Patient feels is a little better. (MS 3/5 left upper and lower extremity)    Data Reviewed: Basic Metabolic Panel:  Recent Labs Lab 01/30/15 0800 01/31/15 0809 01/31/15 1055 02/01/15 1236 02/02/15 0956  NA 135 131* 136 138 135  K 4.2 5.6* 4.0 3.7 4.3  CL 103 99 104 100 99  CO2 '26 24 26 30 28  '$ GLUCOSE 279* 309* 173* 62* 331*  BUN '17 15 14 18 16  '$ CREATININE 0.95 1.03 0.87 1.08 1.04  CALCIUM 8.6 8.7 8.9 9.1 8.6   CBC:  Recent Labs Lab 01/30/15 1839 02/01/15 1415  WBC 9.6 8.0  NEUTROABS 6.2  --   HGB 12.0* 12.5*  HCT 37.4* 38.4*  MCV 94.7 95.0  PLT 189 204   BNP (last 3 results)  Recent Labs  12/03/14 0351 01/24/15 2005  BNP 321.1* 166.1*    ProBNP (last 3 results)  Recent Labs  08/13/14 1850 09/21/14 1928 10/02/14 1033  PROBNP 1525.0* 1870.0* 1085.0*    CBG:  Recent Labs Lab 02/01/15 2258 02/02/15 0917 02/02/15 1240 02/02/15 1610 02/02/15 2126  GLUCAP 266* 314* 236* 195* 216*    Recent Results (from the past 240 hour(s))  Urine culture     Status: None   Collection Time: 01/26/15 10:29 PM  Result Value Ref Range Status   Specimen Description URINE, CLEAN CATCH  Final   Special Requests NONE  Final   Colony Count   Final    4,000 COLONIES/ML Performed at [facility]    Culture   Final    INSIGNIFICANT GROWTH Performed at [facility]    Report Status 01/28/2015 FINAL  Final  Culture, Urine     Status: None   Collection Time: 01/30/15  4:50 PM  Result Value Ref Range Status   Specimen Description URINE, CLEAN CATCH  Final   Special Requests NONE  Final   Colony Count NO GROWTH Performed at [facility]   Final   Culture NO GROWTH Performed at [facility]   Final   Report Status 02/01/2015 FINAL  Final  MRSA PCR  Screening     Status: None   Collection Time: 02/01/15  1:54 PM  Result Value Ref Range Status   MRSA by PCR NEGATIVE NEGATIVE Final    Comment:        The GeneXpert MRSA Assay (FDA approved for NASAL specimens only), is one component of a comprehensive MRSA colonization surveillance program. It is not intended to diagnose MRSA infection nor to guide or monitor treatment for MRSA infections.      Studies: Dg Chest Port 1 View  02/02/2015   CLINICAL DATA:  PICC line placement.  Pneumonia, CHF, diabetes  EXAM: PORTABLE CHEST - 1 VIEW  COMPARISON:  02/01/2015  FINDINGS: Left pacer remains in place, unchanged. Heart is enlarged. No confluent opacities or effusions. No acute bony abnormality. Right PICC line is in place with the tip in the SVC.  IMPRESSION: Right PICC line tip in the SVC.  Cardiomegaly.  No active cardiopulmonary disease.   Electronically Signed   By: Rolm Baptise M.D.   On: 02/02/2015 00:01   Dg Chest Port 1 View  02/01/2015   CLINICAL DATA:  Hypotension  EXAM: PORTABLE CHEST - 1 VIEW  COMPARISON:  01/31/2015  FINDINGS: Cardiac shadow is stable. A pacing device is again seen and stable. Persistent increased density is noted in the right medial lung base consistent with acute infiltrate. The perihilar changes seen previously have resolved in the interval.  IMPRESSION: Persistent right medial lung base changes   Electronically Signed   By: Inez Catalina M.D.   On: 02/01/2015 13:22    Scheduled Meds: . atorvastatin  20 mg Oral q1800  . digoxin  0.125 mg Oral Daily  . feeding supplement (GLUCERNA SHAKE)  237 mL Oral TID BM  . insulin aspart  0-15 Units Subcutaneous TID WC  . insulin glargine  28 Units Subcutaneous QHS  . sodium chloride  10-40 mL Intracatheter Q12H  . warfarin  7.5 mg Oral ONCE-1800  . Warfarin - Pharmacist Dosing Inpatient   Does not apply q1800   Continuous Infusions:   Time: 30 minutes    Barton Dubois  [facility] Pager [phone]. If  7PM-7AM, please contact night-coverage at [email or web address], password [email or web address] 02/03/2015, 8:25 AM  LOS: 2 days

## 2015-02-03 NOTE — Progress Notes (Signed)
Advanced Heart Failure Rounding Note   Subjective:   The HF Team was asked to consult for increased dyspnea and tachycardia by Dr Letta Pate. Prior to admit he had been followed in the HF clinic and was last seen 12/01/2014 and he was stable.   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 Neurology consulted. CT showed R MCA stroke. Did not receive TPA due to delay in arrival. Had ECHO that showed EF 10-15% mild RV HK. No obvious LV clot. As he improved he was transferred to CIR.  This week developed dyspnea and fatigue and transferred back to acute for suspected lowput HF. PICC placed with CO-OX 55%. CVP 6.  Off all HF meds except dig. Moving L side better  This morning more SOB but now feels better. CVP 3. SBP 80s    Objective:   Weight Range:  Vital Signs:   Temp:  [97.2 F (36.2 C)-98.7 F (37.1 C)] 97.9 F (36.6 C) (04/23 0400) Pulse Rate:  [52-108] 108 (04/23 0755) Resp:  [0-28] 19 (04/23 0755) BP: (81-101)/(50-73) 94/69 mmHg (04/23 0755) SpO2:  [97 %-100 %] 97 % (04/22 2200) Weight:  [72.3 kg (159 lb 6.3 oz)] 72.3 kg (159 lb 6.3 oz) (04/23 0400) Last BM Date: 01/31/15  Intake/Output:   Intake/Output Summary (Last 24 hours) at 02/03/15 0915 Last data filed at 02/02/15 1850  Gross per 24 hour  Intake    380 ml  Output    875 ml  Net   -495 ml   Physical Exam: CVP 3 General: Sitting in chair Fatigued appearing. No resp difficulty HEENT: normal Neck: supple. JVP flat Carotids 2+ bilat; no bruits. No lymphadenopathy or thryomegaly appreciated. Cor: PMI laterally displaced. Tachy Regular rate & rhythm. +s3 2/6 MR Lungs: clear Abdomen: soft, nontender, mildly distended. No hepatosplenomegaly. No bruits or masses. Good bowel sounds. Extremities: no cyanosis, clubbing, rash, edema. LUE weak . RUE PICC Neuro:  alert & orientedx3, cranial nerves grossly intact. Affect flat    Labs: Basic Metabolic Panel:  Recent Labs Lab 01/30/15 0800 01/31/15 0809 01/31/15 1055 02/01/15 1236 02/02/15 0956  NA 135 131* 136 138 135  K 4.2 5.6* 4.0 3.7 4.3  CL 103 99 104 100 99  CO2 '26 24 26 30 28  '$ GLUCOSE 279* 309* 173* 62* 331*  BUN '17 15 14 18 16  '$ CREATININE 0.95 1.03 0.87 1.08 1.04  CALCIUM 8.6 8.7 8.9 9.1 8.6    Liver Function Tests: No results for input(s): AST, ALT, ALKPHOS, BILITOT, PROT, ALBUMIN in the last 168 hours. No results for input(s): LIPASE, AMYLASE in the last 168 hours. No results for input(s): AMMONIA in the last 168 hours.  CBC:  Recent Labs Lab 01/30/15 1839 02/01/15 1415  WBC 9.6 8.0  NEUTROABS 6.2  --   HGB 12.0* 12.5*  HCT 37.4* 38.4*  MCV 94.7 95.0  PLT 189 204    Cardiac Enzymes: No results for input(s): CKTOTAL, CKMB, CKMBINDEX, TROPONINI in the last 168 hours.  BNP: BNP (last 3 results)  Recent Labs  12/03/14 0351 01/24/15 2005  BNP 321.1* 166.1*    ProBNP (last 3 results)  Recent Labs  08/13/14 1850 09/21/14 1928 10/02/14 1033  PROBNP 1525.0* 1870.0* 1085.0*      Other results:  Imaging: Dg Chest Port 1 View  02/02/2015   CLINICAL DATA:  PICC line placement.  Pneumonia, CHF, diabetes  EXAM: PORTABLE CHEST - 1 VIEW  COMPARISON:  02/01/2015  FINDINGS: Left pacer remains in place, unchanged. Heart is enlarged. No confluent opacities or effusions. No acute bony abnormality. Right PICC line is in place with the tip in the SVC.  IMPRESSION: Right PICC line tip in the SVC.  Cardiomegaly.  No active cardiopulmonary disease.   Electronically Signed   By: Rolm Baptise M.D.   On: 02/02/2015 00:01   Dg Chest Port 1 View  02/01/2015   CLINICAL DATA:  Hypotension  EXAM: PORTABLE CHEST - 1 VIEW  COMPARISON:  01/31/2015  FINDINGS: Cardiac shadow is stable. A pacing device is again seen and stable. Persistent increased density is noted in the right medial  lung base consistent with acute infiltrate. The perihilar changes seen previously have resolved in the interval.  IMPRESSION: Persistent right medial lung base changes   Electronically Signed   By: Inez Catalina M.D.   On: 02/01/2015 13:22     Medications:     Scheduled Medications: . atorvastatin  20 mg Oral q1800  . digoxin  0.125 mg Oral Daily  . feeding supplement (GLUCERNA SHAKE)  237 mL Oral TID BM  . insulin aspart  0-15 Units Subcutaneous TID WC  . insulin glargine  28 Units Subcutaneous QHS  . sodium chloride  10-40 mL Intracatheter Q12H  . warfarin  7.5 mg Oral ONCE-1800  . Warfarin - Pharmacist Dosing Inpatient   Does not apply q1800    Infusions:    PRN Medications: acetaminophen, sodium chloride   Assessment:  1. CVA, acute with left-sided weakness 2. Dyspnea 3. Acute on Chronic Systolic Heart Failure- ECHO EF 10-15% with non compaction 4. CAD Coronary angiography (12/2008) with mid LAD totally occluded and collateralized. Continue statin and BB. No ASA he is on coumadin. 5. DMII  6. Hypotension- ? Low output HF    Plan/Discussion:    Continues with periods of dyspnea. CVP ok. Prominent s3 on exam. BP tenuous. I remain concerned about low output. Will repeat co-ox. Continue to hold diuretics. Continue digoxin. IF co-ox low may need short course of inotropes. If co-ox ok try low-dose hydralazine for afterload reduction as BP tolerates. Will start ivabradine.   Length of Stay: 2   Glori Bickers MD  02/03/2015, 9:15 AM  Advanced Heart Failure Team Pager [phone] (M-F; [place])  Please contact [facility] for night-coverage after hours (4p -7a ) and weekends on [email or web address]

## 2015-02-04 DIAGNOSIS — E785 Hyperlipidemia, unspecified: Secondary | ICD-10-CM

## 2015-02-04 LAB — BASIC METABOLIC PANEL
Anion gap: 7 (ref 5–15)
BUN: 18 mg/dL (ref 6–23)
CO2: 28 mmol/L (ref 19–32)
Calcium: 8.7 mg/dL (ref 8.4–10.5)
Chloride: 100 mmol/L (ref 96–112)
Creatinine, Ser: 0.93 mg/dL (ref 0.50–1.35)
GFR calc Af Amer: >90 mL/min (ref >90)
GFR calc non Af Amer: >90 mL/min (ref >90)
Glucose, Bld: 277 mg/dL — ABNORMAL HIGH (ref 70–99)
Potassium: 4.4 mmol/L (ref 3.5–5.1)
Sodium: 135 mmol/L (ref 135–145)

## 2015-02-04 LAB — CARBOXYHEMOGLOBIN
Carboxyhemoglobin: 3.2 % — ABNORMAL HIGH (ref 0.5–1.5)
Methemoglobin: 0.6 % (ref 0.0–1.5)
O2 Saturation: 86.4 %
Total hemoglobin: 10.1 g/dL — ABNORMAL LOW (ref 13.5–18.0)

## 2015-02-04 LAB — GLUCOSE, CAPILLARY
Glucose-Capillary: 232 mg/dL — ABNORMAL HIGH (ref 70–99)
Glucose-Capillary: 250 mg/dL — ABNORMAL HIGH (ref 70–99)
Glucose-Capillary: 218 mg/dL — ABNORMAL HIGH (ref 70–99)
Glucose-Capillary: 192 mg/dL — ABNORMAL HIGH (ref 70–99)

## 2015-02-04 LAB — PROTIME-INR
INR: 2.58 — ABNORMAL HIGH (ref 0.00–1.49)
Prothrombin Time: 27.9 seconds — ABNORMAL HIGH (ref 11.6–15.2)

## 2015-02-04 MED ORDER — INSULIN ASPART 100 UNIT/ML ~~LOC~~ SOLN: 5.0000 [IU] | Freq: Three times a day (TID) | SUBCUTANEOUS | Status: DC

## 2015-02-04 MED ORDER — INSULIN ASPART 100 UNIT/ML ~~LOC~~ SOLN
3.0000 [IU] | Freq: Three times a day (TID) | SUBCUTANEOUS | Status: DC
  Administered 2015-02-04 – 2015-02-07 (×9): 3 [IU] via SUBCUTANEOUS

## 2015-02-04 MED ORDER — WARFARIN SODIUM 7.5 MG PO TABS
7.5000 mg | ORAL_TABLET | Freq: Once | ORAL | Status: AC
  Administered 2015-02-04: 7.5 mg via ORAL
  Filled 2015-02-04: qty 1

## 2015-02-04 MED ORDER — INSULIN GLARGINE 100 UNIT/ML ~~LOC~~ SOLN
35.0000 [IU] | Freq: Every day | SUBCUTANEOUS | Status: DC
  Administered 2015-02-04 – 2015-02-06 (×3): 35 [IU] via SUBCUTANEOUS
  Filled 2015-02-04 (×4): qty 0.35

## 2015-02-04 NOTE — Progress Notes (Signed)
Advanced Heart Failure Rounding Note   Subjective:   The HF Team was asked to consult for increased dyspnea and tachycardia by Dr Letta Pate. Prior to admit he had been followed in the HF clinic and was last seen 12/01/2014 and he was stable.   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 Neurology consulted. CT showed R MCA stroke. Did not receive TPA due to delay in arrival. Had ECHO that showed EF 10-15% mild RV HK. No obvious LV clot. As he improved he was transferred to CIR.  This week developed dyspnea and fatigue and transferred back to acute for suspected lowput HF. PICC placed with CO-OX 55%. CVP 6.  Off all HF meds except dig. Moving L side better  This morning feels better. CVP 3. Co-ox 68% SBP 80-90s. Ivabradine started yesterday,     Objective:   Weight Range:  Vital Signs:   Temp:  [97.5 F (36.4 C)-98.6 F (37 C)] 98.6 F (37 C) (04/24 1208) Pulse Rate:  [96-105] 102 (04/24 1208) Resp:  [11-23] 15 (04/24 1208) BP: (78-96)/(49-75) 89/60 mmHg (04/24 1208) SpO2:  [10 %-100 %] 95 % (04/24 1208) Last BM Date: 01/31/15  Intake/Output:   Intake/Output Summary (Last 24 hours) at 02/04/15 1232 Last data filed at 02/04/15 1208  Gross per 24 hour  Intake     10 ml  Output   1405 ml  Net  -1395 ml   Physical Exam: CVP 3 General: Sitting in chair Fatigued appearing. No resp difficulty HEENT: normal Neck: supple. JVP flat Carotids 2+ bilat; no bruits. No lymphadenopathy or thryomegaly appreciated. Cor: PMI laterally displaced. Tachy Regular rate & rhythm. +s3 2/6 MR Lungs: clear Abdomen: soft, nontender, mildly distended. No hepatosplenomegaly. No bruits or masses. Good bowel sounds. Extremities: no cyanosis, clubbing, rash, edema. LUE weak . RUE PICC Neuro: alert & orientedx3, cranial nerves grossly intact.  Affect flat    Labs: Basic Metabolic Panel:  Recent Labs Lab 01/31/15 0809 01/31/15 1055 02/01/15 1236 02/02/15 0956 02/04/15 0530  NA 131* 136 138 135 135  K 5.6* 4.0 3.7 4.3 4.4  CL 99 104 100 99 100  CO2 '24 26 30 28 28  '$ GLUCOSE 309* 173* 62* 331* 277*  BUN '15 14 18 16 18  '$ CREATININE 1.03 0.87 1.08 1.04 0.93  CALCIUM 8.7 8.9 9.1 8.6 8.7    Liver Function Tests: No results for input(s): AST, ALT, ALKPHOS, BILITOT, PROT, ALBUMIN in the last 168 hours. No results for input(s): LIPASE, AMYLASE in the last 168 hours. No results for input(s): AMMONIA in the last 168 hours.  CBC:  Recent Labs Lab 01/30/15 1839 02/01/15 1415  WBC 9.6 8.0  NEUTROABS 6.2  --   HGB 12.0* 12.5*  HCT 37.4* 38.4*  MCV 94.7 95.0  PLT 189 204    Cardiac Enzymes: No results for input(s): CKTOTAL, CKMB, CKMBINDEX, TROPONINI in the last 168 hours.  BNP: BNP (last 3 results)  Recent Labs  12/03/14 0351 01/24/15 2005  BNP 321.1* 166.1*    ProBNP (last 3 results)  Recent Labs  08/13/14 1850 09/21/14 1928 10/02/14 1033  PROBNP 1525.0* 1870.0* 1085.0*      Other results:  Imaging: No results found.   Medications:     Scheduled Medications: . atorvastatin  20 mg Oral q1800  . digoxin  0.125 mg Oral Daily  . feeding supplement (  GLUCERNA SHAKE)  237 mL Oral TID BM  . insulin aspart  0-15 Units Subcutaneous TID WC  . insulin aspart  3 Units Subcutaneous TID WC  . insulin glargine  35 Units Subcutaneous QHS  . ivabradine  5 mg Oral BID WC  . sodium chloride  10-40 mL Intracatheter Q12H  . warfarin  7.5 mg Oral ONCE-1800  . Warfarin - Pharmacist Dosing Inpatient   Does not apply q1800    Infusions:    PRN Medications: acetaminophen, sodium chloride   Assessment:  1. CVA, acute with left-sided weakness 2. Dyspnea 3. Acute on Chronic Systolic Heart Failure- ECHO EF 10-15% with non compaction 4. CAD Coronary angiography (12/2008) with mid LAD totally occluded and  collateralized. Continue statin and BB. No ASA he is on coumadin. 5. DMII  6. Hypotension    Plan/Discussion:    BP remains low and has s3 on exam. I have been worried about low output HF but co-ox has been quite good and stable. CVP remains low. Will hold diuretics. Continue current regimen can likely go back to CIR tomorrow. BP too low for ACE or b-blocker at this point.   Length of Stay: 3   Glori Bickers MD  02/04/2015, 12:32 PM  Advanced Heart Failure Team Pager [phone] (M-F; 7a - 4p)  Please contact [facility] for night-coverage after hours (4p -7a ) and weekends on [email or web address]

## 2015-02-04 NOTE — Progress Notes (Signed)
ANTICOAGULATION CONSULT NOTE - Follow Up Consult  Pharmacy Consult for warfarin Indication: LV noncompaction  No Known Allergies  Patient Measurements: Weight: 159 lb 6.3 oz (72.3 kg)  Vital Signs: Temp: 98.3 F (36.8 C) (04/24 0346) Temp Source: Oral (04/24 0346) BP: 92/67 mmHg (04/24 0346) Pulse Rate: 100 (04/24 0346)  Labs:  Recent Labs  02/01/15 1236 02/01/15 1415 02/02/15 0956 02/03/15 0450 02/04/15 0530  HGB  --  12.5*  --   --   --   HCT  --  38.4*  --   --   --   PLT  --  204  --   --   --   LABPROT  --   --  28.6* 26.5* 27.9*  INR  --   --  2.67* 2.42* 2.58*  CREATININE 1.08  --  1.04  --  0.93    Estimated Creatinine Clearance: 95 mL/min (by C-G formula based on Cr of 0.93).   Medications:  Scheduled:  . atorvastatin  20 mg Oral q1800  . digoxin  0.125 mg Oral Daily  . feeding supplement (GLUCERNA SHAKE)  237 mL Oral TID BM  . insulin aspart  0-15 Units Subcutaneous TID WC  . insulin glargine  28 Units Subcutaneous QHS  . ivabradine  5 mg Oral BID WC  . sodium chloride  10-40 mL Intracatheter Q12H  . Warfarin - Pharmacist Dosing Inpatient   Does not apply q1800    Assessment: 53yoM w/ left ventricular noncompation currently on therapeutic coumadin. INR today 2.58. No bleeding noted. Last CBC 4/21- stable.   Goal of Therapy:  INR 2-3 Monitor platelets by anticoagulation protocol: Yes   Plan:  Warfain 7.'5mg'$  x 1 tonight Daily INR/PT CBC q72h Monitor s/sx of bleeding  Thank you for allowing pharmacy to be part of this patient's care team  [place], Pharm.D Clinical Pharmacy Resident Pager: [phone] 02/04/2015 .8:28 AM

## 2015-02-04 NOTE — Progress Notes (Addendum)
[facility] PROGRESS NOTE Interim History: 54 y/o male with recent right MCA admitted from CIR due to acute on chronic systolic HF and hypotension. Heart failure team is on board. Will follow rec's and continue treating/adjusting meds for the rest of his medical problems.  Filed Weights   02/02/15 0347 02/03/15 0400  Weight: 71.3 kg (157 lb 3 oz) 72.3 kg (159 lb 6.3 oz)        Intake/Output Summary (Last 24 hours) at 02/04/15 7494 Last data filed at 02/04/15 0300  Gross per 24 hour  Intake     10 ml  Output   1080 ml  Net  -1070 ml    Assessment/Plan: 1-Acute on chronic systolic congestive heart failure/hypotension: patient with increased dyspnea and tachycardia while at CIR -concerns for low output HF -COX at 68% -BP remains low and CVP 3-7 -heart failure team on board -no signs of significant fluid overload on exam; and is feeling his breathing is better and not SOB overnight -continue holding diuretics -patient on digoxin; and started on ivabradine on 4/23; will follow their recommendations -daily weights, strict I's and O's -continue holding antihypertensive agents -EF 10-15% -patient is S/P ICD placement -not able to use B-blockers or ACE/ARB currently given hypotension  2-diabetes type 2 uncontrolled: with last A1C of 13.3 -will continue carb modified diet -continue lantus, but will increase to 35 units for better control (patient eating well and CBG's in 270-330) -patient advise not to skip meals -will continue moderate SSI and meal coverage  3-HTN: BP remains soft, but stable and continue slightly raising (now in the mid 90's and low 496'P systolic) -holding all antihypertensive agents -patient with TED hoses in place  4-HLD: continue statins  5-Right MCA stroke: with left side residual weakness -continue risk factors modification -patient is on coumadin  -INR therapeutic; pharmacy adjusting  -plan would be readmission to CIR when medically stable  vs SNF vs Home with home health services; will determine.  Code Status: Full Family Communication: wife at bedside  Disposition Plan: BP is better, but still soft. He is stable and if ok with cardiology and no plans for pressors might be suitable to transfer to telemetry bed    Consultants:  Heart failure team  Procedures: ECHO: 01/26/15 - Left ventricle: The cavity size was moderately to severely dilated. Wall thickness was normal. Systolic function was severely reduced. The estimated ejection fraction was in the range of 10% to 15%. Diffuse hypokinesis. There was fusion of early and atrial contributions to ventricular filling. The study is not technically sufficient to allow evaluation of LV diastolic function. No evidence of thrombus. - Aortic valve: There was trivial regurgitation. - Mitral valve: There was malcoaptation of the valve leaflets. There was mild to moderate regurgitation directed centrally. - Left atrium: The atrium was mildly dilated. - Right ventricle: Systolic function was mildly reduced. - Atrial septum: The septum bowed from left to right, consistent with increased left atrial pressure. - Pericardium, extracardiac: A small pericardial effusion was identified posterior to the heart. The fluid had no internal echoes.There was no evidence of hemodynamic compromise.  Antibiotics:  None   HPI/Subjective: Afebrile, feeling ok. No CP. Endorses SOB improved/resolved. Patient feeling left side is getting slightly stronger  Objective: Filed Vitals:   02/03/15 2303 02/03/15 2348 02/04/15 0346 02/04/15 0854  BP: '88/49 93/68 92/67 '$ 91/70  Pulse: 104 102 100 99  Temp:  98 F (36.7 C) 98.3 F (36.8 C) 97.5 F (36.4 C)  TempSrc:  Oral Oral Oral  Resp: '22 15 11 19  '$ Weight:      SpO2: 95% 10% 100% 99%     Exam:  General: Alert, awake, oriented x3, in no acute distress. Denies CP and no SOB now or overnight. No fever HEENT: No bruits, no  goiter; no JVD seen Heart: slightly tachycardic, no rubs or gallops; positive soft murmur appreciated on exam. Sinus rhythm   Lungs: Good air movement, no wheezing, no frank crackles Abdomen: Soft, nontender, nondistended, positive bowel sounds.  Neuro: no new focal deficit; no dysarthria and with positive left side weakness/deficit from recent stroke. Patient feels is a little better. (MS 3/5 left upper and lower extremity; stable and unchanged on exam)   Data Reviewed: Basic Metabolic Panel:  Recent Labs Lab 01/31/15 0809 01/31/15 1055 02/01/15 1236 02/02/15 0956 02/04/15 0530  NA 131* 136 138 135 135  K 5.6* 4.0 3.7 4.3 4.4  CL 99 104 100 99 100  CO2 '24 26 30 28 28  '$ GLUCOSE 309* 173* 62* 331* 277*  BUN '15 14 18 16 18  '$ CREATININE 1.03 0.87 1.08 1.04 0.93  CALCIUM 8.7 8.9 9.1 8.6 8.7   CBC:  Recent Labs Lab 01/30/15 1839 02/01/15 1415  WBC 9.6 8.0  NEUTROABS 6.2  --   HGB 12.0* 12.5*  HCT 37.4* 38.4*  MCV 94.7 95.0  PLT 189 204   BNP (last 3 results)  Recent Labs  12/03/14 0351 01/24/15 2005  BNP 321.1* 166.1*    ProBNP (last 3 results)  Recent Labs  08/13/14 1850 09/21/14 1928 10/02/14 1033  PROBNP 1525.0* 1870.0* 1085.0*    CBG:  Recent Labs Lab 02/03/15 0825 02/03/15 1255 02/03/15 1617 02/04/15 0803 02/04/15 0806  GLUCAP 214* 189* 224* 250* 232*    Recent Results (from the past 240 hour(s))  Urine culture     Status: None   Collection Time: 01/26/15 10:29 PM  Result Value Ref Range Status   Specimen Description URINE, CLEAN CATCH  Final   Special Requests NONE  Final   Colony Count   Final    4,000 COLONIES/ML Performed at [facility]    Culture   Final    INSIGNIFICANT GROWTH Performed at [facility]    Report Status 01/28/2015 FINAL  Final  Culture, Urine     Status: None   Collection Time: 01/30/15  4:50 PM  Result Value Ref Range Status   Specimen Description URINE, CLEAN CATCH  Final   Special Requests  NONE  Final   Colony Count NO GROWTH Performed at [facility]   Final   Culture NO GROWTH Performed at [facility]   Final   Report Status 02/01/2015 FINAL  Final  MRSA PCR Screening     Status: None   Collection Time: 02/01/15  1:54 PM  Result Value Ref Range Status   MRSA by PCR NEGATIVE NEGATIVE Final    Comment:        The GeneXpert MRSA Assay (FDA approved for NASAL specimens only), is one component of a comprehensive MRSA colonization surveillance program. It is not intended to diagnose MRSA infection nor to guide or monitor treatment for MRSA infections.      Studies: No results found.  Scheduled Meds: . atorvastatin  20 mg Oral q1800  . digoxin  0.125 mg Oral Daily  . feeding supplement (GLUCERNA SHAKE)  237 mL Oral TID BM  . insulin aspart  0-15 Units Subcutaneous TID WC  . insulin  glargine  28 Units Subcutaneous QHS  . ivabradine  5 mg Oral BID WC  . sodium chloride  10-40 mL Intracatheter Q12H  . warfarin  7.5 mg Oral ONCE-1800  . Warfarin - Pharmacist Dosing Inpatient   Does not apply q1800   Continuous Infusions:   Time: 30 minutes    Barton Dubois  [facility] Pager [phone]. If 7PM-7AM, please contact night-coverage at [email or web address], password [facility] 02/04/2015, 9:27 AM  LOS: 3 days

## 2015-02-05 ENCOUNTER — Ambulatory Visit (HOSPITAL_COMMUNITY): Payer: Self-pay

## 2015-02-05 ENCOUNTER — Encounter (HOSPITAL_COMMUNITY): Payer: Self-pay | Admitting: *Deleted

## 2015-02-05 DIAGNOSIS — E785 Hyperlipidemia, unspecified: Secondary | ICD-10-CM | POA: Insufficient documentation

## 2015-02-05 LAB — CBC
HCT: 33.3 % — ABNORMAL LOW (ref 39.0–52.0)
Hemoglobin: 10.7 g/dL — ABNORMAL LOW (ref 13.0–17.0)
MCH: 30.8 pg (ref 26.0–34.0)
MCHC: 32.1 g/dL (ref 30.0–36.0)
MCV: 96 fL (ref 78.0–100.0)
Platelets: 231 10*3/uL (ref 150–400)
RBC: 3.47 MIL/uL — ABNORMAL LOW (ref 4.22–5.81)
RDW: 13 % (ref 11.5–15.5)
WBC: 8.9 10*3/uL (ref 4.0–10.5)

## 2015-02-05 LAB — PROTIME-INR
INR: 2.48 — ABNORMAL HIGH (ref 0.00–1.49)
Prothrombin Time: 27.1 seconds — ABNORMAL HIGH (ref 11.6–15.2)

## 2015-02-05 LAB — GLUCOSE, CAPILLARY
Glucose-Capillary: 70 mg/dL (ref 70–99)
Glucose-Capillary: 187 mg/dL — ABNORMAL HIGH (ref 70–99)
Glucose-Capillary: 193 mg/dL — ABNORMAL HIGH (ref 70–99)
Glucose-Capillary: 154 mg/dL — ABNORMAL HIGH (ref 70–99)
Glucose-Capillary: 108 mg/dL — ABNORMAL HIGH (ref 70–99)
Glucose-Capillary: 179 mg/dL — ABNORMAL HIGH (ref 70–99)

## 2015-02-05 MED ORDER — SPIRONOLACTONE 12.5 MG HALF TABLET
12.5000 mg | ORAL_TABLET | Freq: Every day | ORAL | Status: DC
  Administered 2015-02-05: 12.5 mg via ORAL
  Filled 2015-02-05 (×2): qty 1

## 2015-02-05 MED ORDER — WARFARIN SODIUM 7.5 MG PO TABS
7.5000 mg | ORAL_TABLET | Freq: Every day | ORAL | Status: DC
  Administered 2015-02-05 – 2015-02-07 (×3): 7.5 mg via ORAL
  Filled 2015-02-05 (×3): qty 1

## 2015-02-05 MED ORDER — WARFARIN SODIUM 7.5 MG PO TABS
7.5000 mg | ORAL_TABLET | Freq: Once | ORAL | Status: DC
  Filled 2015-02-05: qty 1

## 2015-02-05 MED ORDER — DIGOXIN 250 MCG PO TABS
0.2500 mg | ORAL_TABLET | Freq: Every day | ORAL | Status: DC
  Administered 2015-02-05 – 2015-02-06 (×2): 0.25 mg via ORAL
  Filled 2015-02-05 (×3): qty 1

## 2015-02-05 NOTE — Progress Notes (Addendum)
ANTICOAGULATION CONSULT NOTE - Follow Up Consult  Pharmacy Consult for warfarin Indication: LV noncompaction  No Known Allergies  Patient Measurements: Weight: 159 lb 6.3 oz (72.3 kg)  Vital Signs: Temp: 98.8 F (37.1 C) (04/25 0751) Temp Source: Oral (04/25 0751) BP: 92/64 mmHg (04/25 0751) Pulse Rate: 105 (04/25 0751)  Labs:  Recent Labs  02/02/15 0956 02/03/15 0450 02/04/15 0530 02/05/15 0618  HGB  --   --   --  10.7*  HCT  --   --   --  33.3*  PLT  --   --   --  231  LABPROT 28.6* 26.5* 27.9* 27.1*  INR 2.67* 2.42* 2.58* 2.48*  CREATININE 1.04  --  0.93  --     Estimated Creatinine Clearance: 95 mL/min (by C-G formula based on Cr of 0.93).   Medications:  Scheduled:  . atorvastatin  20 mg Oral q1800  . digoxin  0.125 mg Oral Daily  . feeding supplement (GLUCERNA SHAKE)  237 mL Oral TID BM  . insulin aspart  0-15 Units Subcutaneous TID WC  . insulin aspart  3 Units Subcutaneous TID WC  . insulin glargine  35 Units Subcutaneous QHS  . ivabradine  5 mg Oral BID WC  . sodium chloride  10-40 mL Intracatheter Q12H  . Warfarin - Pharmacist Dosing Inpatient   Does not apply q1800    Assessment: 53yoM w/ left ventricular noncompation currently on therapeutic coumadin. INR today 2.48. No bleeding noted. Last CBC stable.   AC: Warfarin PTA for hx L ventricular noncompaction -INR 2.4, no bleeding; last CBC 4/21 stable, ASA stopped per neuro rec since INR >2 Last anticoag note 12/01/14>>dose 7.'5mg'$  daily (med rec says '10mg'$  daily?)  ID: Afebrile, WBC WNL, no abx    CV: Hx CHF (EF 10-15%), HLD - BP remains soft (80s/60s), HR high (100s)- asa, lipitor - LDL 116 , coox 68, cvp 3, BP soft, HR 100s Dig level later this week -atorva, dig, corlanor,spiro-no room to add much else  Endo: Hx DM (A1c 13.3) - CBGs elevated on SSI + levemir; getting better (CBG 190s)  GI/Nutr: Dysphagia diet - LFTs WNL    Neuro: Hx drug abuse, new non-dominant R MCA infarct- no tPA d/t delay  in arrival; A&O, UDS +cocaine, much more alert  Renal: WNL; SCr 0.93, lytes ok  Pulm: Hx OSA - 98% RA    Heme/Onc: CBC WNL    Best practices: warfarin  Plan:   Coumadin 7.5 mg po x1 daily for now INR mwf Dig level later this week Likely to trx back to rehab  Thank you for allowing pharmacy to be part of this patient's care team  Erin Hearing PharmD., BCPS Clinical Pharmacist Pager [phone] 02/05/2015 9:01 AM

## 2015-02-05 NOTE — Progress Notes (Signed)
I await PT and OT reevaluations so that I can begin [employer] approval for a possible inpt rehab readmission. [phone]

## 2015-02-05 NOTE — Progress Notes (Signed)
Advanced Heart Failure Rounding Note   Subjective:   The HF Team was asked to consult for increased dyspnea and tachycardia by Dr Letta Pate. Prior to admit he had been followed in the HF clinic and was last seen 12/01/2014 and he was stable.   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 Neurology consulted. CT showed R MCA stroke. Did not receive TPA due to delay in arrival. Had ECHO that showed EF 10-15% mild RV HK. No obvious LV clot. As he improved he was transferred to CIR.  This week developed dyspnea and fatigue and transferred back to acute for suspected lowput HF.   Yesterday ivabradine started. CVP 3.  Denies SOB/Orthopnea.      Objective:   Weight Range:  Vital Signs:   Temp:  [97.5 F (36.4 C)-99 F (37.2 C)] 98.8 F (37.1 C) (04/25 0751) Pulse Rate:  [94-106] 105 (04/25 0751) Resp:  [15-25] 21 (04/25 0751) BP: (89-98)/(60-73) 92/64 mmHg (04/25 0751) SpO2:  [91 %-99 %] 96 % (04/25 0751) Last BM Date: 02/04/15  Intake/Output:   Intake/Output Summary (Last 24 hours) at 02/05/15 0822 Last data filed at 02/04/15 2300  Gross per 24 hour  Intake    250 ml  Output    825 ml  Net   -575 ml   Physical Exam: CVP 3 General: In bed.  No resp difficulty HEENT: normal Neck: supple. JVP flat Carotids 2+ bilat; no bruits. No lymphadenopathy or thryomegaly appreciated. Cor: PMI laterally displaced. Tachy Regular rate & rhythm. +s3 2/6 MR Lungs: clear Abdomen: soft, nontender, mildly distended. No hepatosplenomegaly. No bruits or masses. Good bowel sounds. Extremities: no cyanosis, clubbing, rash, edema. LUE weak . RUE PICC Neuro: alert & orientedx3, cranial nerves grossly intact. Affect flat    Labs: Basic Metabolic Panel:  Recent Labs Lab 01/31/15 0809 01/31/15 1055 02/01/15 1236 02/02/15 0956  02/04/15 0530  NA 131* 136 138 135 135  K 5.6* 4.0 3.7 4.3 4.4  CL 99 104 100 99 100  CO2 '24 26 30 28 28  '$ GLUCOSE 309* 173* 62* 331* 277*  BUN '15 14 18 16 18  '$ CREATININE 1.03 0.87 1.08 1.04 0.93  CALCIUM 8.7 8.9 9.1 8.6 8.7    Liver Function Tests: No results for input(s): AST, ALT, ALKPHOS, BILITOT, PROT, ALBUMIN in the last 168 hours. No results for input(s): LIPASE, AMYLASE in the last 168 hours. No results for input(s): AMMONIA in the last 168 hours.  CBC:  Recent Labs Lab 01/30/15 1839 02/01/15 1415 02/05/15 0618  WBC 9.6 8.0 8.9  NEUTROABS 6.2  --   --   HGB 12.0* 12.5* 10.7*  HCT 37.4* 38.4* 33.3*  MCV 94.7 95.0 96.0  PLT 189 204 231    Cardiac Enzymes: No results for input(s): CKTOTAL, CKMB, CKMBINDEX, TROPONINI in the last 168 hours.  BNP: BNP (last 3 results)  Recent Labs  12/03/14 0351 01/24/15 2005  BNP 321.1* 166.1*    ProBNP (last 3 results)  Recent Labs  08/13/14 1850 09/21/14 1928 10/02/14 1033  PROBNP 1525.0* 1870.0* 1085.0*      Other results:  Imaging: No results found.   Medications:     Scheduled Medications: . atorvastatin  20 mg Oral q1800  . digoxin  0.125 mg Oral Daily  . feeding supplement (GLUCERNA SHAKE)  237 mL Oral TID BM  . insulin aspart  0-15 Units Subcutaneous TID WC  . insulin aspart  3 Units Subcutaneous TID WC  . insulin glargine  35 Units Subcutaneous QHS  . ivabradine  5 mg Oral BID WC  . sodium chloride  10-40 mL Intracatheter Q12H  . Warfarin - Pharmacist Dosing Inpatient   Does not apply q1800    Infusions:    PRN Medications: acetaminophen, sodium chloride   Assessment:  1. CVA, acute with left-sided weakness 2. Dyspnea 3. Acute on Chronic Systolic Heart Failure- ECHO EF 10-15% with non compaction 4. CAD Coronary angiography (12/2008) with mid LAD totally occluded and collateralized. Continue statin and BB. No ASA he is on coumadin for noncompaction. . 5. DMII  6.  Hypotension    Plan/Discussion:    BP remains low and has s3 on exam. BP too low for ACE or b-blocker at this point. Continue ivabradine 5 mg twice a day.   INR 2.4 and therapeutic.   Can go to CIR today. We will follow.  Length of Stay: 4   CLEGG,AMY  NP-C  02/05/2015, 8:22 AM  Advanced Heart Failure Team Pager [phone] (M-F; [place])  Please contact [facility] for night-coverage after hours (4p -7a ) and weekends on [email or web address]   Patient seen and examined with Darrick Grinder, NP. We discussed all aspects of the encounter. I agree with the assessment and plan as stated above.   Overall stable from HF perspective. Will add low dose spiro and see if BP tolerates. Can go to CIR. We will follow.  Daniel Bensimhon,MD 10:45 AM

## 2015-02-05 NOTE — Progress Notes (Signed)
[facility] PROGRESS NOTE Interim History: 54 y/o male with recent right MCA admitted from CIR due to acute on chronic systolic HF and hypotension. Heart failure team is on board. Will follow rec's and continue treating/adjusting meds for the rest of his medical problems.  Filed Weights   02/02/15 0347 02/03/15 0400  Weight: 71.3 kg (157 lb 3 oz) 72.3 kg (159 lb 6.3 oz)        Intake/Output Summary (Last 24 hours) at 02/05/15 1425 Last data filed at 02/05/15 0900  Gross per 24 hour  Intake    480 ml  Output    800 ml  Net   -320 ml    Assessment/Plan: 1-Acute on chronic systolic congestive heart failure/hypotension: patient with increased dyspnea and tachycardia while at CIR -concerns for low output HF -last COX at 68% -BP remains low and CVP 3-7 -heart failure team on board -no signs of significant fluid overload on exam; and is feeling his breathing is better and not SOB overnight -patient on digoxin; and started on ivabradine on 4/23; will follow further recommendations from HF team -daily weights, strict I's and O's -continue holding antihypertensive agents -EF 10-15% -patient is S/P ICD placement -not able to use B-blockers or ACE/ARB currently given hypotension -he didn't tolerate low dose spironolactone  2-diabetes type 2 uncontrolled: with last A1C of 13.3 -will continue carb modified diet -continue lantus, but will increase to 35 units for better control (patient eating well and CBG's in 270-330) -patient advise not to skip meals -will continue moderate SSI and meal coverage -much better with current regimen  3-HTN: BP remains soft, but stable -unable to tolerate low dose of spironolactone -will hold all antihypertensive agents -patient with TED hoses in place  4-HLD: continue statins  5-Right MCA stroke: with left side residual weakness -continue risk factors modification -patient is on coumadin  -INR therapeutic; pharmacy adjusting  -plan would  be readmission to CIR when medically stable vs SNF vs Home with home health services; will determine. -follow PT/OT  Code Status: Full Family Communication: wife at bedside  Disposition Plan: BP is soft and unable to tolerate spironolactone. Will move to telemetry. Waiting on PT/OT and readmission to CIR   Consultants:  Heart failure team  Procedures: ECHO: 01/26/15 - Left ventricle: The cavity size was moderately to severely dilated. Wall thickness was normal. Systolic function was severely reduced. The estimated ejection fraction was in the range of 10% to 15%. Diffuse hypokinesis. There was fusion of early and atrial contributions to ventricular filling. The study is not technically sufficient to allow evaluation of LV diastolic function. No evidence of thrombus. - Aortic valve: There was trivial regurgitation. - Mitral valve: There was malcoaptation of the valve leaflets. There was mild to moderate regurgitation directed centrally. - Left atrium: The atrium was mildly dilated. - Right ventricle: Systolic function was mildly reduced. - Atrial septum: The septum bowed from left to right, consistent with increased left atrial pressure. - Pericardium, extracardiac: A small pericardial effusion was identified posterior to the heart. The fluid had no internal echoes.There was no evidence of hemodynamic compromise.  Antibiotics:  None   HPI/Subjective: Afebrile, no CP. BP still soft and unable to tolerate not even low dose of spironolactone. CBG's better controlled  Objective: Filed Vitals:   02/05/15 1100 02/05/15 1200 02/05/15 1235 02/05/15 1300  BP: 97/70 95/62  83/60  Pulse: 101     Temp:   98.5 F (36.9 C)   TempSrc:   Oral  Resp: '15 18 21 24  '$ Weight:      SpO2: 98%        Exam:  General: Alert, awake, oriented x3, in no acute distress. Denies CP and/or palpitations. Patient felt SOB overnight and required oxygen supplementation. No fever.    HEENT: No bruits, no goiter; no JVD seen Heart: slightly tachycardic, no rubs or gallops; positive soft murmur appreciated on exam. Sinus rhythm   Lungs: Good air movement, no wheezing, no frank crackles Abdomen: Soft, nontender, nondistended, positive bowel sounds.  Neuro: no new focal deficit; no dysarthria and with positive left side weakness/deficit from recent stroke. Patient feels is a little better. (MS 3/5 left upper and lower extremity; stable and unchanged on exam)   Data Reviewed: Basic Metabolic Panel:  Recent Labs Lab 01/31/15 0809 01/31/15 1055 02/01/15 1236 02/02/15 0956 02/04/15 0530  NA 131* 136 138 135 135  K 5.6* 4.0 3.7 4.3 4.4  CL 99 104 100 99 100  CO2 '24 26 30 28 28  '$ GLUCOSE 309* 173* 62* 331* 277*  BUN '15 14 18 16 18  '$ CREATININE 1.03 0.87 1.08 1.04 0.93  CALCIUM 8.7 8.9 9.1 8.6 8.7   CBC:  Recent Labs Lab 01/30/15 1839 02/01/15 1415 02/05/15 0618  WBC 9.6 8.0 8.9  NEUTROABS 6.2  --   --   HGB 12.0* 12.5* 10.7*  HCT 37.4* 38.4* 33.3*  MCV 94.7 95.0 96.0  PLT 189 204 231   BNP (last 3 results)  Recent Labs  12/03/14 0351 01/24/15 2005  BNP 321.1* 166.1*    ProBNP (last 3 results)  Recent Labs  08/13/14 1850 09/21/14 1928 10/02/14 1033  PROBNP 1525.0* 1870.0* 1085.0*    CBG:  Recent Labs Lab 02/04/15 1210 02/04/15 1740 02/04/15 2026 02/05/15 0750 02/05/15 1231  GLUCAP 218* 192* 187* 193* 154*    Recent Results (from the past 240 hour(s))  Urine culture     Status: None   Collection Time: 01/26/15 10:29 PM  Result Value Ref Range Status   Specimen Description URINE, CLEAN CATCH  Final   Special Requests NONE  Final   Colony Count   Final    4,000 COLONIES/ML Performed at [facility]    Culture   Final    INSIGNIFICANT GROWTH Performed at [facility]    Report Status 01/28/2015 FINAL  Final  Culture, Urine     Status: None   Collection Time: 01/30/15  4:50 PM  Result Value Ref Range Status    Specimen Description URINE, CLEAN CATCH  Final   Special Requests NONE  Final   Colony Count NO GROWTH Performed at [facility]   Final   Culture NO GROWTH Performed at [facility]   Final   Report Status 02/01/2015 FINAL  Final  MRSA PCR Screening     Status: None   Collection Time: 02/01/15  1:54 PM  Result Value Ref Range Status   MRSA by PCR NEGATIVE NEGATIVE Final    Comment:        The GeneXpert MRSA Assay (FDA approved for NASAL specimens only), is one component of a comprehensive MRSA colonization surveillance program. It is not intended to diagnose MRSA infection nor to guide or monitor treatment for MRSA infections.      Studies: No results found.  Scheduled Meds: . atorvastatin  20 mg Oral q1800  . digoxin  0.125 mg Oral Daily  . feeding supplement (GLUCERNA SHAKE)  237 mL Oral TID BM  .  insulin aspart  0-15 Units Subcutaneous TID WC  . insulin aspart  3 Units Subcutaneous TID WC  . insulin glargine  35 Units Subcutaneous QHS  . ivabradine  5 mg Oral BID WC  . sodium chloride  10-40 mL Intracatheter Q12H  . spironolactone  12.5 mg Oral Daily  . warfarin  7.5 mg Oral q1800  . Warfarin - Pharmacist Dosing Inpatient   Does not apply q1800   Continuous Infusions:   Time: 30 minutes    Barton Dubois  [facility] Pager [phone]. If 7PM-7AM, please contact night-coverage at [email or web address], password [facility] 02/05/2015, 2:25 PM  LOS: 4 days

## 2015-02-06 LAB — GLUCOSE, CAPILLARY
Glucose-Capillary: 134 mg/dL — ABNORMAL HIGH (ref 70–99)
Glucose-Capillary: 116 mg/dL — ABNORMAL HIGH (ref 70–99)
Glucose-Capillary: 174 mg/dL — ABNORMAL HIGH (ref 70–99)
Glucose-Capillary: 208 mg/dL — ABNORMAL HIGH (ref 70–99)

## 2015-02-06 MED ORDER — ASPIRIN 81 MG PO CHEW
81.0000 mg | CHEWABLE_TABLET | ORAL | Status: AC
  Administered 2015-02-07: 81 mg via ORAL
  Filled 2015-02-06: qty 1

## 2015-02-06 MED ORDER — SODIUM CHLORIDE 0.9 % IJ SOLN: 3.0000 mL | Freq: Two times a day (BID) | INTRAMUSCULAR | Status: DC

## 2015-02-06 MED ORDER — SODIUM CHLORIDE 0.9 % IJ SOLN: 3.0000 mL | INTRAMUSCULAR | Status: DC | PRN

## 2015-02-06 MED ORDER — SODIUM CHLORIDE 0.9 % IJ SOLN
3.0000 mL | INTRAMUSCULAR | Status: DC | PRN
Start: 2015-02-06 — End: 2015-02-07

## 2015-02-06 MED ORDER — SODIUM CHLORIDE 0.9 % IV SOLN
250.0000 mL | INTRAVENOUS | Status: DC | PRN
Start: 2015-02-06 — End: 2015-02-07

## 2015-02-06 MED ORDER — SODIUM CHLORIDE 0.9 % IJ SOLN
3.0000 mL | Freq: Two times a day (BID) | INTRAMUSCULAR | Status: DC
  Administered 2015-02-06: 3 mL via INTRAVENOUS

## 2015-02-06 NOTE — Progress Notes (Signed)
I have begun authorization for a possible readmission to inpt rehab pending [place] approval. If approved, bed available Wednesday. [phone]

## 2015-02-06 NOTE — Progress Notes (Signed)
[facility] PROGRESS NOTE Interim History: 54 y/o male with recent right MCA admitted from CIR due to acute on chronic systolic HF and hypotension. Heart failure team is on board. Will follow rec's and continue treating/adjusting meds for the rest of his medical problems.   Patient BP has remained soft and making impossible to resume ACE, B-blockers and/or diuretic therapy. Plan is for him to have right heart cath on 4/27 and if everything ok move back to CIR fo rehabilitation. Heart failure team will continue actively following patient.  Filed Weights   02/05/15 1600 02/05/15 1710 02/06/15 0728  Weight: 70.9 kg (156 lb 4.9 oz) 70.625 kg (155 lb 11.2 oz) 71.351 kg (157 lb 4.8 oz)        Intake/Output Summary (Last 24 hours) at 02/06/15 2144 Last data filed at 02/06/15 1845  Gross per 24 hour  Intake   2060 ml  Output   1950 ml  Net    110 ml    Assessment/Plan: 1-Acute on chronic systolic congestive heart failure/hypotension: patient with increased dyspnea and tachycardia while at CIR -concerns for low output HF -last [facility] at 68% -BP remains low and CVP 3-7 -heart failure team on board -no signs of significant fluid overload on exam; and is feeling his breathing is better and not SOB overnight -patient on digoxin; and started on ivabradine on 4/23; will follow further recommendations from HF team -daily weights, strict I's and O's -continue holding antihypertensive agents -EF 10-15% -patient is S/P ICD placement -not able to use B-blockers or ACE/ARB currently given hypotension -he didn't tolerate low dose spironolactone -plan is for RHC in am (4/27)  2-diabetes type 2 uncontrolled: with last A1C of 13.3 -will continue carb modified diet -continue lantus, but will increase to 35 units for better control (patient eating well and CBG's in 270-330) -patient advise not to skip meals -will continue moderate SSI and meal coverage -much better with current regimen; will  monitor and adjust further as needed  3-HTN: BP remains soft, but stable -unable to tolerate any of his heart medications at this point -will hold all antihypertensive agents -patient with TED hoses in place, especially for when he is standing   4-HLD: continue statins  5-Right MCA stroke: with left side residual weakness -continue risk factors modification -patient is on coumadin  -INR therapeutic; pharmacy adjusting  -plan would be readmission to CIR when medically stable vs SNF vs Home with home health services; will determine. -follow PT/OT  Code Status: Full Family Communication: wife at bedside  Disposition Plan: BP is soft and unable to tolerate spironolactone. Will move to telemetry. Waiting insurance approval to return for readmission to CIR; most likely tomorrow after RHC, if everything stable.   Consultants:  Heart failure team  Procedures: ECHO: 01/26/15 - Left ventricle: The cavity size was moderately to severely dilated. Wall thickness was normal. Systolic function was severely reduced. The estimated ejection fraction was in the range of 10% to 15%. Diffuse hypokinesis. There was fusion of early and atrial contributions to ventricular filling. The study is not technically sufficient to allow evaluation of LV diastolic function. No evidence of thrombus. - Aortic valve: There was trivial regurgitation. - Mitral valve: There was malcoaptation of the valve leaflets. There was mild to moderate regurgitation directed centrally. - Left atrium: The atrium was mildly dilated. - Right ventricle: Systolic function was mildly reduced. - Atrial septum: The septum bowed from left to right, consistent with increased left atrial pressure. - Pericardium, extracardiac:  A small pericardial effusion was identified posterior to the heart. The fluid had no internal echoes.There was no evidence of hemodynamic compromise.  Antibiotics:  None    HPI/Subjective: Afebrile, no CP. BP still soft and unable to tolerate any diuretics, ACE or B-blocker. Patient reports some SOB overnight and also SOB with walking while doing PT today. Better by the time of my examination.  Objective: Filed Vitals:   02/06/15 1034 02/06/15 1451 02/06/15 1712 02/06/15 2030  BP: 95/67 100/63 99/72 101/74  Pulse: 87 93 96 95  Temp:  98.3 F (36.8 C)  98.2 F (36.8 C)  TempSrc:  Oral  Oral  Resp:  18  20  Height:      Weight:      SpO2:  99%  99%     Exam:  General: Alert, awake, oriented x3, in no acute distress. Denies CP and/or palpitations. Patient felt SOB overnight again and also with SOB with exertion during PT assessment. He is on 2 L of oxygen supplementation. No fever.  HEENT: No bruits, no goiter; no JVD seen Heart: slightly tachycardic, no rubs or gallops; positive soft murmur appreciated on exam. Sinus rhythm   Lungs: Good air movement, no wheezing, no frank crackles Abdomen: Soft, nontender, nondistended, positive bowel sounds.  Neuro: no new focal deficit; no dysarthria and with positive left side weakness/deficit from recent stroke. Patient feels is a little better. (MS 3/5 left upper and lower extremity; stable and unchanged on exam)   Data Reviewed: Basic Metabolic Panel:  Recent Labs Lab 01/31/15 0809 01/31/15 1055 02/01/15 1236 02/02/15 0956 02/04/15 0530  NA 131* 136 138 135 135  K 5.6* 4.0 3.7 4.3 4.4  CL 99 104 100 99 100  CO2 '24 26 30 28 28  '$ GLUCOSE 309* 173* 62* 331* 277*  BUN '15 14 18 16 18  '$ CREATININE 1.03 0.87 1.08 1.04 0.93  CALCIUM 8.7 8.9 9.1 8.6 8.7   CBC:  Recent Labs Lab 02/01/15 1415 02/05/15 0618  WBC 8.0 8.9  HGB 12.5* 10.7*  HCT 38.4* 33.3*  MCV 95.0 96.0  PLT 204 231   BNP (last 3 results)  Recent Labs  12/03/14 0351 01/24/15 2005  BNP 321.1* 166.1*    ProBNP (last 3 results)  Recent Labs  08/13/14 1850 09/21/14 1928 10/02/14 1033  PROBNP 1525.0* 1870.0* 1085.0*     CBG:  Recent Labs Lab 02/05/15 2053 02/06/15 0655 02/06/15 1152 02/06/15 1701 02/06/15 2120  GLUCAP 179* 134* 116* 174* 208*    Recent Results (from the past 240 hour(s))  Culture, Urine     Status: None   Collection Time: 01/30/15  4:50 PM  Result Value Ref Range Status   Specimen Description URINE, CLEAN CATCH  Final   Special Requests NONE  Final   Colony Count NO GROWTH Performed at [facility]   Final   Culture NO GROWTH Performed at [facility]   Final   Report Status 02/01/2015 FINAL  Final  MRSA PCR Screening     Status: None   Collection Time: 02/01/15  1:54 PM  Result Value Ref Range Status   MRSA by PCR NEGATIVE NEGATIVE Final    Comment:        The GeneXpert MRSA Assay (FDA approved for NASAL specimens only), is one component of a comprehensive MRSA colonization surveillance program. It is not intended to diagnose MRSA infection nor to guide or monitor treatment for MRSA infections.      Studies: No results  found.  Scheduled Meds: . [START ON 02/07/2015] aspirin  81 mg Oral Pre-Cath  . atorvastatin  20 mg Oral q1800  . digoxin  0.25 mg Oral Daily  . feeding supplement (GLUCERNA SHAKE)  237 mL Oral TID BM  . insulin aspart  0-15 Units Subcutaneous TID WC  . insulin aspart  3 Units Subcutaneous TID WC  . insulin glargine  35 Units Subcutaneous QHS  . ivabradine  5 mg Oral BID WC  . sodium chloride  10-40 mL Intracatheter Q12H  . sodium chloride  3 mL Intravenous Q12H  . sodium chloride  3 mL Intravenous Q12H  . warfarin  7.5 mg Oral q1800  . Warfarin - Pharmacist Dosing Inpatient   Does not apply q1800   Continuous Infusions:   Time: 30 minutes    Barton Dubois  [facility] Pager [phone]. If 7PM-7AM, please contact night-coverage at [email or web address], password [facility] 02/06/2015, 9:44 PM  LOS: 5 days

## 2015-02-06 NOTE — Care Management Note (Addendum)
    Page 1 of 1   02/07/2015     1:47:54 PM CARE MANAGEMENT NOTE 02/07/2015  Patient:  [facility]   Account Number:  [id number]  Date Initiated:  02/01/2015  Documentation initiated by:  MAYO,HENRIETTA  Subjective/Objective Assessment:   dx systolic failure; lives with parent    PCP  Gayland Curry     Action/Plan:   Anticipated DC Date:  02/07/2015   Anticipated DC Plan:  IP REHAB FACILITY      DC Planning Services  CM consult      Choice offered to / List presented to:             Status of service:  Completed, signed off Medicare Important Message given?  YES (If response is "NO", the following Medicare IM given date fields will be blank) Date Medicare IM given:  02/06/2015 Medicare IM given by:  AMERSON,JULIE Date Additional Medicare IM given:   Additional Medicare IM given by:    Discharge Disposition:  IP REHAB FACILITY  Per UR Regulation:  Reviewed for med. necessity/level of care/duration of stay  If discussed at [place] of Stay Meetings, dates discussed:   02/06/2015    Comments:  02/07/15 Ellan Lambert, RN, BSN [phone] Pt discharging back to [facility] IP rehab today.

## 2015-02-06 NOTE — Evaluation (Signed)
Physical Therapy Evaluation Patient Details Name: Dennis [name] MRN: [id number] DOB: 1961-09-23 Today's Date: 02/06/2015   History of Present Illness  Pt admitted from inpatient rehab unit due to dyspnea and hypotension. Pt was receiving rehab following a R MCA CVA.  PMH: DM, polysubstance abuse, chronic HF.  Clinical Impression  Pt admitted with above diagnosis. Pt currently with functional limitations due to the deficits listed below (see PT Problem List). Pt still limited by cognitive impairments with decr safety awareness and left inattention at times.  Has progressed however still feel that pt would benefit from Rehab stay to reach maximal function.  Desat with activity (see below).   Pt will benefit from skilled PT to increase their independence and safety with mobility to allow discharge to the venue listed below.      Follow Up Recommendations CIR    Equipment Recommendations  Other (comment) (TBA at next venue)    Recommendations for Other Services Rehab consult;OT consult     Precautions / Restrictions Precautions Precautions: Fall Precaution Comments: L inattention, poor insight Restrictions Weight Bearing Restrictions: No      Mobility  Bed Mobility Overal bed mobility: Needs Assistance Bed Mobility: Supine to Sit;Sit to Supine     Supine to sit: Supervision Sit to supine: Supervision      Transfers Overall transfer level: Needs assistance Equipment used: None Transfers: Sit to/from Stand Sit to Stand: Min guard         General transfer comment: min guard assist due to impulsive at times with need for steadying assist.    Ambulation/Gait Ambulation/Gait assistance: Min assist Ambulation Distance (Feet): 200 Feet Assistive device: None Gait Pattern/deviations: Step-through pattern;Decreased stride length;Decreased step length - right;Decreased stance time - left;Decreased dorsiflexion - left;Decreased weight shift to left;Drifts right/left (when fatigues,  circumducts left LE.) Gait velocity: decreased Gait velocity interpretation: Below normal speed for age/gender General Gait Details: Pt now circumducting left LE to bring through with occaisional drifting due to left hip instability.  Buckling noted however pt does appear to self correct.  Pt lacks awareness of left UE with it swinging into left LE appearing to throw him off balance at times.  Pt went to bathroom and urinated in toilet with min guard assist.  Needed min asssist to pick up item from floor.  Continues to need cues to slow down and concentrate on tasks as he is impulsive and does not fully attend to tasks.   Stairs            Wheelchair Mobility    Modified Rankin (Stroke Patients Only) Modified Rankin (Stroke Patients Only) Pre-Morbid Rankin Score: No symptoms Modified Rankin: Moderately severe disability     Balance Overall balance assessment: Needs assistance Sitting-balance support: No upper extremity supported;Feet supported Sitting balance-Leahy Scale: Good     Standing balance support: No upper extremity supported;During functional activity Standing balance-Leahy Scale: Fair Standing balance comment: can stand statically to wash hands but cannot withstand challenges.                               Pertinent Vitals/Pain Pain Assessment: No/denies pain  Pt on 2LO2 on arrival with sat 92-95%.  On RA at rest pt sat 91-94%.  When ambulating, pt sat 87-91% on RA.  With 2LO2 with ambulation, pt sat 91% and >.  Pt BP sitting was 95/75 with HR 93 bpm.  BP in standing 99/77 with HR 102 bpm.  Home Living Family/patient expects to be discharged to:: Inpatient rehab Living Arrangements: Parent Available Help at Discharge: Family;Available 24 hours/day;Friend(s) (girlfriend-Pat) Type of Home: House Home Access: Stairs to enter Entrance Stairs-Rails: None Entrance Stairs-Number of Steps: 1 step into back entry Home Layout: Multi-level;1/2 bath on main  level;Bed/bath upstairs Home Equipment: Tub bench Additional Comments: [name], pt's longtime girlfriend, will assist as needed.    Prior Function Level of Independence: Independent (prior to CVA)         Comments: Pt reports he drives and is independent with IADLs      Hand Dominance   Dominant Hand: Right    Extremity/Trunk Assessment   Upper Extremity Assessment: Defer to OT evaluation       LUE Deficits / Details: Full AROM, 4/5 strength, + drift with vision occluded, uses L UE spontaneously for B UE tasks   Lower Extremity Assessment: LLE deficits/detail   LLE Deficits / Details: >4/5 MMT with knee extension, hip flexion, ankle DF/PF, and hallux flex/ext.  Cervical / Trunk Assessment: Other exceptions  Communication   Communication: No difficulties  Cognition Arousal/Alertness: Awake/alert (falls asleep when not stimulated) Behavior During Therapy: Flat affect Overall Cognitive Status: Impaired/Different from baseline Area of Impairment: Safety/judgement;Awareness;Problem solving;Attention   Current Attention Level: Alternating     Safety/Judgement: Decreased awareness of deficits;Decreased awareness of safety Awareness: Emergent   General Comments: Pt with poor insight into deficits other than that he is weaker on the L side.  Unable to state any ramifications of his deficits in ADL or mobility.    General Comments      Exercises        Assessment/Plan    PT Assessment Patient needs continued PT services  PT Diagnosis Difficulty walking;Abnormality of gait;Hemiplegia non-dominant side   PT Problem List Decreased strength;Decreased range of motion;Decreased activity tolerance;Decreased balance;Decreased mobility;Decreased coordination;Decreased knowledge of use of DME;Decreased safety awareness;Decreased knowledge of precautions;Impaired sensation  PT Treatment Interventions DME instruction;Gait training;Stair training;Functional mobility  training;Therapeutic activities;Therapeutic exercise;Balance training;Neuromuscular re-education;Patient/family education   PT Goals (Current goals can be found in the Care Plan section) Acute Rehab PT Goals Patient Stated Goal: go home PT Goal Formulation: With patient Time For Goal Achievement: 02/20/15 Potential to Achieve Goals: Good    Frequency Min 4X/week   Barriers to discharge Inaccessible home environment;Decreased caregiver support      Co-evaluation               End of Session Equipment Utilized During Treatment: Gait belt;Oxygen Activity Tolerance: Patient limited by fatigue Patient left: in bed;with call bell/phone within reach;with family/visitor present Nurse Communication: Mobility status         Time: 4967-5916 PT Time Calculation (min) (ACUTE ONLY): 23 min   Charges:   PT Evaluation $Initial PT Evaluation Tier I: 1 Procedure PT Treatments $Gait Training: 8-22 mins   PT G CodesDenice Paradise Feb 20, 2015, 11:52 AM [facility] [phone] (pager)

## 2015-02-06 NOTE — Progress Notes (Signed)
Advanced Heart Failure Rounding Note   Subjective:   The HF Team was asked to consult for increased dyspnea and tachycardia by Dr Letta Pate. Prior to admit he had been followed in the HF clinic and was last seen 12/01/2014 and he was stable.   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 Neurology consulted. CT showed R MCA stroke. Did not receive TPA due to delay in arrival. Had ECHO that showed EF 10-15% mild RV HK. No obvious LV clot. As he improved he was transferred to CIR.  This week developed dyspnea and fatigue and transferred back to acute for suspected lowput HF. PICC placed and CVP and co-ox ok but BP too low to titrate HF meds. Ivabradine started.  Yesterday started spiro but BP soft so this was stopped.   This morning was SOB again. + dyspnea on walking hall.     Objective:   Weight Range:  Vital Signs:   Temp:  [98.2 F (36.8 C)-98.6 F (37 C)] 98.3 F (36.8 C) (04/26 0728) Pulse Rate:  [95-102] 95 (04/26 0728) Resp:  [15-28] 18 (04/26 0728) BP: (68-97)/(58-70) 90/65 mmHg (04/26 0728) SpO2:  [93 %-98 %] 93 % (04/26 0728) Weight:  [155 lb 11.2 oz (70.625 kg)-157 lb 4.8 oz (71.351 kg)] 157 lb 4.8 oz (71.351 kg) (04/26 0728) Last BM Date: 02/05/15  Intake/Output:   Intake/Output Summary (Last 24 hours) at 02/06/15 0815 Last data filed at 02/06/15 0728  Gross per 24 hour  Intake    660 ml  Output   1350 ml  Net   -690 ml   Physical Exam:  General: In bed.  No resp difficulty HEENT: normal Neck: supple. JVP 5 Carotids 2+ bilat; no bruits. No lymphadenopathy or thryomegaly appreciated. Cor: PMI laterally displaced. Tachy Regular rate & rhythm. +s3 2/6 MR Lungs: clear Abdomen: soft, nontender, mildly distended. No hepatosplenomegaly. No bruits or masses. Good bowel sounds. Extremities: no cyanosis,  clubbing, rash, edema. LUE weak . RUE PICC Neuro: alert & orientedx3, cranial nerves grossly intact. Affect flat    Labs: Basic Metabolic Panel:  Recent Labs Lab 01/31/15 0809 01/31/15 1055 02/01/15 1236 02/02/15 0956 02/04/15 0530  NA 131* 136 138 135 135  K 5.6* 4.0 3.7 4.3 4.4  CL 99 104 100 99 100  CO2 '24 26 30 28 28  '$ GLUCOSE 309* 173* 62* 331* 277*  BUN '15 14 18 16 18  '$ CREATININE 1.03 0.87 1.08 1.04 0.93  CALCIUM 8.7 8.9 9.1 8.6 8.7    Liver Function Tests: No results for input(s): AST, ALT, ALKPHOS, BILITOT, PROT, ALBUMIN in the last 168 hours. No results for input(s): LIPASE, AMYLASE in the last 168 hours. No results for input(s): AMMONIA in the last 168 hours.  CBC:  Recent Labs Lab 01/30/15 1839 02/01/15 1415 02/05/15 0618  WBC 9.6 8.0 8.9  NEUTROABS 6.2  --   --   HGB 12.0* 12.5* 10.7*  HCT 37.4* 38.4* 33.3*  MCV 94.7 95.0 96.0  PLT 189 204 231    Cardiac Enzymes: No results for input(s): CKTOTAL, CKMB, CKMBINDEX, TROPONINI in the last 168 hours.  BNP: BNP (last 3 results)  Recent Labs  12/03/14 0351 01/24/15 2005  BNP 321.1* 166.1*    ProBNP (last 3 results)  Recent Labs  08/13/14 1850 09/21/14 1928 10/02/14 1033  PROBNP 1525.0* 1870.0* 1085.0*  Other results:  Imaging: No results found.   Medications:     Scheduled Medications: . atorvastatin  20 mg Oral q1800  . digoxin  0.25 mg Oral Daily  . feeding supplement (GLUCERNA SHAKE)  237 mL Oral TID BM  . insulin aspart  0-15 Units Subcutaneous TID WC  . insulin aspart  3 Units Subcutaneous TID WC  . insulin glargine  35 Units Subcutaneous QHS  . ivabradine  5 mg Oral BID WC  . sodium chloride  10-40 mL Intracatheter Q12H  . warfarin  7.5 mg Oral q1800  . Warfarin - Pharmacist Dosing Inpatient   Does not apply q1800    Infusions:    PRN Medications: acetaminophen, sodium chloride   Assessment:  1. CVA, acute with left-sided weakness 2. Dyspnea 3. Acute  on Chronic Systolic Heart Failure- ECHO EF 10-15% with non compaction 4. CAD Coronary angiography (12/2008) with mid LAD totally occluded and collateralized. Continue statin and BB. No ASA he is on coumadin for noncompaction. . 5. DMII  6. Hypotension    Plan/Discussion:    BP remains low and has s3 on exam. BP too low for ACE or b-blocker or spiro.  Continue ivabradine 5 mg twice a day.   INR every Mon-Wed-Fri.    Can go to CIR today. We will follow.  Length of Stay: 5   CLEGG,AMY  NP-C  02/06/2015, 8:15 AM  Advanced Heart Failure Team Pager [phone] (M-F; [place])  Please contact [facility] for night-coverage after hours (4p -7a ) and weekends on [email or web address]  Patient seen and examined with Darrick Grinder, NP. We discussed all aspects of the encounter. I agree with the assessment and plan as stated above.   CVP remains low but still symptomatic. Will plan RHC in am and then plan to return to CIR. Cath procedure discussed with him.   Daniel Bensimhon,MD 2:00 PM

## 2015-02-06 NOTE — Evaluation (Signed)
Occupational Therapy Evaluation Patient Details Name: Dennis [name] MRN: [id number] DOB: [birth year] Today's Date: 02/06/2015    History of Present Illness Pt admitted from inpatient rehab unit due to dyspnea and hypotension. Pt was receiving rehab following a R MCA CVA.  PMH: DM, polysubstance abuse, chronic HF.   Clinical Impression   Prior to pt's CVA, he was independent in ADL, IADL and mobility.  He presents with L side weakness, decreased balance, mild L neglect and visual field cut, and impaired cognition/insight. Pt has good potential to be able to discharge with supervision with further intensive rehab. Recommending return to CIR to complete therapy program.  Will follow acutely.    Follow Up Recommendations  CIR;Supervision/Assistance - 24 hour    Equipment Recommendations  None recommended by OT    Recommendations for Other Services       Precautions / Restrictions Precautions Precautions: Fall Precaution Comments: L inattention, poor insight Restrictions Weight Bearing Restrictions: No      Mobility Bed Mobility   Bed Mobility: Supine to Sit;Sit to Supine     Supine to sit: Supervision Sit to supine: Supervision      Transfers Overall transfer level: Needs assistance Equipment used: None Transfers: Sit to/from Stand Sit to Stand: Supervision              Balance     Sitting balance-Leahy Scale: Good       Standing balance-Leahy Scale: Fair                              ADL Overall ADL's : Needs assistance/impaired Eating/Feeding: Set up;Sitting   Grooming: Wash/dry hands;Min guard;Standing   Upper Body Bathing: Min guard;Standing   Lower Body Bathing: Min guard;Sit to/from stand   Upper Body Dressing : Minimal assistance;Sitting (for orientation primarily)   Lower Body Dressing: Min guard;Sit to/from stand   Toilet Transfer: Min guard;Ambulation;Regular Museum/gallery exhibitions officer and Hygiene: Min  guard;Sit to/from stand       Functional mobility during ADLs: Min guard (only ambulated within room) General ADL Comments: Pt with decreased planning and executive function for ADL and IADL.     Vision Additional Comments: mild L neglect and end peripheral field deficit   Perception Perception Perception Deficits: Inattention/neglect   Praxis      Pertinent Vitals/Pain Pain Assessment: No/denies pain     Hand Dominance Right   Extremity/Trunk Assessment Upper Extremity Assessment Upper Extremity Assessment: LUE deficits/detail LUE Deficits / Details: Full AROM, 4/5 strength, + drift with vision occluded, uses L UE spontaneously for B UE tasks LUE Coordination: decreased fine motor;decreased gross motor   Lower Extremity Assessment Lower Extremity Assessment: Defer to PT evaluation       Communication Communication Communication: No difficulties   Cognition Arousal/Alertness: Awake/alert (falls asleep when not stimulated) Behavior During Therapy: Flat affect Overall Cognitive Status: Impaired/Different from baseline Area of Impairment: Safety/judgement;Awareness;Problem solving;Attention   Current Attention Level: Alternating     Safety/Judgement: Decreased awareness of deficits;Decreased awareness of safety Awareness: Emergent   General Comments: Pt with poor insight into deficits other than that he is weaker on the L side.  Unable to state any ramifications of his deficits in ADL or mobility.   General Comments       Exercises       Shoulder Instructions      Home Living Family/patient expects to be discharged to:: Inpatient rehab Living Arrangements: Parent Available Help  at Discharge: Family;Available 24 hours/day;Friend(s) (girlfriend-Dennis [name]) Type of Home: House Home Access: Stairs to enter [employer] of Steps: 1 step into back entry Entrance Stairs-Rails: None Home Layout: Multi-level;1/2 bath on main level;Bed/bath upstairs Alternate  Level Stairs-Number of Steps: 12 (first 5 steps rail on R and no wall L; next 7 steps rail on L and wall on R) Alternate Level Stairs-Rails: Right;Left Bathroom Shower/Tub: Tub/shower unit;Curtain   Bathroom Toilet: Handicapped height Bathroom Accessibility: Yes How Accessible: Accessible via walker Home Equipment: Tub bench   Additional Comments: Dennis [name], pt's longtime girlfriend, will assist as needed.      Prior Functioning/Environment Level of Independence: Independent (prior to CVA)        Comments: Pt reports he drives and is independent with IADLs     OT Diagnosis: Generalized weakness;Cognitive deficits;Disturbance of vision;Hemiplegia dominant side   OT Problem List: Decreased strength;Impaired balance (sitting and/or standing);Impaired vision/perception;Decreased coordination;Decreased cognition;Decreased safety awareness;Decreased knowledge of use of DME or AE;Decreased knowledge of precautions;Impaired sensation;Impaired UE functional use   OT Treatment/Interventions: Self-care/ADL training;Neuromuscular education;DME and/or AE instruction;Therapeutic activities;Cognitive remediation/compensation;Visual/perceptual remediation/compensation;Patient/family education;Balance training    OT Goals(Current goals can be found in the care plan section) Acute Rehab OT Goals Patient Stated Goal: go home OT Goal Formulation: With patient Time For Goal Achievement: 02/13/15 Potential to Achieve Goals: Good ADL Goals Pt Will Transfer to Toilet: with supervision;ambulating;regular height toilet Pt Will Perform Toileting - Clothing Manipulation and hygiene: with supervision;sit to/from stand Pt Will Perform Tub/Shower Transfer: Tub transfer;with supervision;ambulating Additional ADL Goal #1: Pt will gather items necessary to complete self care with supervision. Additional ADL Goal #2: Pt will demonstrate increased insight by verbalizing deficits and safety ramifications. Additional ADL  Goal #3: Pt will shower with supervision.  OT Frequency: Min 2X/week   Barriers to D/C:            Co-evaluation              End of Session Equipment Utilized During Treatment: Gait belt Nurse Communication: Mobility status  Activity Tolerance: Patient tolerated treatment well Patient left: in bed;with call bell/phone within reach;with family/visitor present   Time: 6578-4696 OT Time Calculation (min): 23 min Charges:  OT General Charges $OT Visit: 1 Procedure OT Evaluation $Initial OT Evaluation Tier I: 1 Procedure OT Treatments $Self Care/Home Management : 8-22 mins G-Codes:    Malka So 02/06/2015, 10:13 AM  [phone]

## 2015-02-06 NOTE — Progress Notes (Signed)
Patient's pacemaker is not capturing on the monitor. MD Paged to receiving RN's phone and made Charge RN aware as well.

## 2015-02-07 ENCOUNTER — Encounter (HOSPITAL_COMMUNITY)
Admission: AD | Disposition: A | Discharge status: Rehabilitation Facility | Payer: Self-pay | Source: Ambulatory Visit | Attending: Internal Medicine

## 2015-02-07 ENCOUNTER — Encounter (HOSPITAL_COMMUNITY): Payer: Self-pay | Admitting: Internal Medicine

## 2015-02-07 ENCOUNTER — Inpatient Hospital Stay (HOSPITAL_COMMUNITY)
Admission: RE | Admit: 2015-02-07 | Discharge: 2015-02-14 | DRG: 064 | Disposition: A | Discharge status: Home Health | Payer: Medicare Other | Source: Intra-hospital | Attending: Physical Medicine & Rehabilitation | Admitting: Physical Medicine & Rehabilitation

## 2015-02-07 ENCOUNTER — Ambulatory Visit (HOSPITAL_COMMUNITY): Payer: Self-pay

## 2015-02-07 DIAGNOSIS — E119 Type 2 diabetes mellitus without complications: Secondary | ICD-10-CM | POA: Diagnosis present

## 2015-02-07 DIAGNOSIS — I2510 Atherosclerotic heart disease of native coronary artery without angina pectoris: Secondary | ICD-10-CM | POA: Diagnosis present

## 2015-02-07 DIAGNOSIS — I2582 Chronic total occlusion of coronary artery: Secondary | ICD-10-CM | POA: Diagnosis present

## 2015-02-07 DIAGNOSIS — I255 Ischemic cardiomyopathy: Secondary | ICD-10-CM | POA: Diagnosis present

## 2015-02-07 DIAGNOSIS — I5022 Chronic systolic (congestive) heart failure: Secondary | ICD-10-CM | POA: Diagnosis not present

## 2015-02-07 DIAGNOSIS — R0600 Dyspnea, unspecified: Secondary | ICD-10-CM | POA: Diagnosis present

## 2015-02-07 DIAGNOSIS — I63411 Cerebral infarction due to embolism of right middle cerebral artery: Secondary | ICD-10-CM | POA: Diagnosis present

## 2015-02-07 DIAGNOSIS — I639 Cerebral infarction, unspecified: Secondary | ICD-10-CM | POA: Diagnosis not present

## 2015-02-07 DIAGNOSIS — I5042 Chronic combined systolic (congestive) and diastolic (congestive) heart failure: Secondary | ICD-10-CM | POA: Diagnosis present

## 2015-02-07 DIAGNOSIS — I959 Hypotension, unspecified: Secondary | ICD-10-CM | POA: Diagnosis not present

## 2015-02-07 DIAGNOSIS — G8194 Hemiplegia, unspecified affecting left nondominant side: Secondary | ICD-10-CM | POA: Diagnosis present

## 2015-02-07 DIAGNOSIS — I5023 Acute on chronic systolic (congestive) heart failure: Secondary | ICD-10-CM | POA: Diagnosis present

## 2015-02-07 DIAGNOSIS — Z95810 Presence of automatic (implantable) cardiac defibrillator: Secondary | ICD-10-CM

## 2015-02-07 DIAGNOSIS — Z7901 Long term (current) use of anticoagulants: Secondary | ICD-10-CM

## 2015-02-07 DIAGNOSIS — I63511 Cerebral infarction due to unspecified occlusion or stenosis of right middle cerebral artery: Secondary | ICD-10-CM | POA: Diagnosis present

## 2015-02-07 HISTORY — PX: RIGHT HEART CATHETERIZATION: SHX5447

## 2015-02-07 LAB — POCT I-STAT 3, VENOUS BLOOD GAS (G3P V)
Acid-base deficit: 1 mmol/L (ref 0.0–2.0)
Acid-base deficit: 1 mmol/L (ref 0.0–2.0)
Acid-base deficit: 2 mmol/L (ref 0.0–2.0)
Bicarbonate: 23.7 mEq/L (ref 20.0–24.0)
Bicarbonate: 25.5 mEq/L — ABNORMAL HIGH (ref 20.0–24.0)
Bicarbonate: 25.6 mEq/L — ABNORMAL HIGH (ref 20.0–24.0)
O2 Saturation: 53 %
O2 Saturation: 56 %
O2 Saturation: 61 %
TCO2: 25 mmol/L (ref 0–100)
TCO2: 27 mmol/L (ref 0–100)
TCO2: 27 mmol/L (ref 0–100)
pCO2, Ven: 45.8 mmHg (ref 45.0–50.0)
pCO2, Ven: 50 mmHg (ref 45.0–50.0)
pCO2, Ven: 50.9 mmHg — ABNORMAL HIGH (ref 45.0–50.0)
pH, Ven: 7.31 — ABNORMAL HIGH (ref 7.250–7.300)
pH, Ven: 7.315 — ABNORMAL HIGH (ref 7.250–7.300)
pH, Ven: 7.322 — ABNORMAL HIGH (ref 7.250–7.300)
pO2, Ven: 31 mmHg (ref 30.0–45.0)
pO2, Ven: 32 mmHg (ref 30.0–45.0)
pO2, Ven: 34 mmHg (ref 30.0–45.0)

## 2015-02-07 LAB — GLUCOSE, CAPILLARY
Glucose-Capillary: 133 mg/dL — ABNORMAL HIGH (ref 70–99)
Glucose-Capillary: 46 mg/dL — ABNORMAL LOW (ref 70–99)
Glucose-Capillary: 120 mg/dL — ABNORMAL HIGH (ref 70–99)
Glucose-Capillary: 66 mg/dL — ABNORMAL LOW (ref 70–99)
Glucose-Capillary: 73 mg/dL (ref 70–99)
Glucose-Capillary: 121 mg/dL — ABNORMAL HIGH (ref 70–99)
Glucose-Capillary: 220 mg/dL — ABNORMAL HIGH (ref 70–99)
Glucose-Capillary: 126 mg/dL — ABNORMAL HIGH (ref 70–99)

## 2015-02-07 LAB — PROTIME-INR
INR: 2.52 — ABNORMAL HIGH (ref 0.00–1.49)
Prothrombin Time: 27.3 seconds — ABNORMAL HIGH (ref 11.6–15.2)

## 2015-02-07 SURGERY — RIGHT HEART CATH
Anesthesia: LOCAL

## 2015-02-07 MED ORDER — SODIUM CHLORIDE 0.9 % IJ SOLN
10.0000 mL | INTRAMUSCULAR | Status: DC | PRN
  Administered 2015-02-07 – 2015-02-11 (×4): 10 mL
  Administered 2015-02-11 – 2015-02-12 (×2): 20 mL
  Administered 2015-02-12 – 2015-02-14 (×3): 10 mL
  Filled 2015-02-07 (×7): qty 40

## 2015-02-07 MED ORDER — ATORVASTATIN CALCIUM 20 MG PO TABS: 20.0000 mg | ORAL_TABLET | Freq: Every day | ORAL | Status: DC

## 2015-02-07 MED ORDER — IVABRADINE HCL 5 MG PO TABS: 5.0000 mg | ORAL_TABLET | Freq: Two times a day (BID) | ORAL | Status: DC

## 2015-02-07 MED ORDER — ONDANSETRON HCL 4 MG PO TABS: 4.0000 mg | ORAL_TABLET | Freq: Four times a day (QID) | ORAL | Status: DC | PRN

## 2015-02-07 MED ORDER — GLUCERNA SHAKE PO LIQD
237.0000 mL | Freq: Three times a day (TID) | ORAL | Status: DC
Start: 2015-02-07 — End: 2015-02-08
  Administered 2015-02-07 – 2015-02-08 (×3): 237 mL via ORAL

## 2015-02-07 MED ORDER — LIDOCAINE HCL (PF) 1 % IJ SOLN
INTRAMUSCULAR | Status: AC
  Filled 2015-02-07: qty 30

## 2015-02-07 MED ORDER — INSULIN ASPART 100 UNIT/ML ~~LOC~~ SOLN
4.0000 [IU] | Freq: Three times a day (TID) | SUBCUTANEOUS | Status: DC
  Administered 2015-02-08 – 2015-02-12 (×11): 4 [IU] via SUBCUTANEOUS

## 2015-02-07 MED ORDER — DIGOXIN 250 MCG PO TABS
0.2500 mg | ORAL_TABLET | Freq: Every day | ORAL | Status: DC
  Administered 2015-02-08 – 2015-02-14 (×7): 0.25 mg via ORAL
  Filled 2015-02-07 (×9): qty 1

## 2015-02-07 MED ORDER — ONDANSETRON HCL 4 MG/2ML IJ SOLN: 4.0000 mg | Freq: Four times a day (QID) | INTRAMUSCULAR | Status: DC | PRN

## 2015-02-07 MED ORDER — FLEET ENEMA 7-19 GM/118ML RE ENEM: 1.0000 | ENEMA | Freq: Once | RECTAL | Status: AC | PRN

## 2015-02-07 MED ORDER — ACETAMINOPHEN 325 MG PO TABS: 650.0000 mg | ORAL_TABLET | Freq: Four times a day (QID) | ORAL | Status: DC | PRN

## 2015-02-07 MED ORDER — ACETAMINOPHEN 325 MG PO TABS: 650.0000 mg | ORAL_TABLET | ORAL | Status: DC | PRN

## 2015-02-07 MED ORDER — HEPARIN (PORCINE) IN NACL 2-0.9 UNIT/ML-% IJ SOLN
INTRAMUSCULAR | Status: AC
  Filled 2015-02-07: qty 1000

## 2015-02-07 MED ORDER — SODIUM CHLORIDE 0.9 % IJ SOLN
10.0000 mL | Freq: Two times a day (BID) | INTRAMUSCULAR | Status: DC
  Administered 2015-02-07: 10 mL
  Administered 2015-02-08 – 2015-02-12 (×2): 20 mL

## 2015-02-07 MED ORDER — WARFARIN - PHARMACIST DOSING INPATIENT
Freq: Every day | Status: DC
  Administered 2015-02-11 – 2015-02-13 (×2)

## 2015-02-07 MED ORDER — SENNOSIDES-DOCUSATE SODIUM 8.6-50 MG PO TABS
2.0000 | ORAL_TABLET | Freq: Two times a day (BID) | ORAL | Status: DC
  Filled 2015-02-07 (×2): qty 2

## 2015-02-07 MED ORDER — SODIUM CHLORIDE 0.9 % IJ SOLN: 3.0000 mL | INTRAMUSCULAR | Status: DC | PRN

## 2015-02-07 MED ORDER — INSULIN ASPART 100 UNIT/ML ~~LOC~~ SOLN
0.0000 [IU] | Freq: Three times a day (TID) | SUBCUTANEOUS | Status: DC
  Administered 2015-02-08 – 2015-02-13 (×10): 3 [IU] via SUBCUTANEOUS
  Administered 2015-02-13: 2 [IU] via SUBCUTANEOUS
  Administered 2015-02-14: 3 [IU] via SUBCUTANEOUS

## 2015-02-07 MED ORDER — DIGOXIN 250 MCG PO TABS: 0.2500 mg | ORAL_TABLET | Freq: Every day | ORAL | Status: DC

## 2015-02-07 MED ORDER — MIDAZOLAM HCL 2 MG/2ML IJ SOLN
INTRAMUSCULAR | Status: AC
  Filled 2015-02-07: qty 2

## 2015-02-07 MED ORDER — INSULIN GLARGINE 100 UNIT/ML ~~LOC~~ SOLN
35.0000 [IU] | Freq: Every day | SUBCUTANEOUS | Status: DC
  Administered 2015-02-07 – 2015-02-11 (×4): 35 [IU] via SUBCUTANEOUS
  Filled 2015-02-07 (×8): qty 0.35

## 2015-02-07 MED ORDER — SODIUM CHLORIDE 0.9 % IV SOLN: 250.0000 mL | INTRAVENOUS | Status: DC | PRN

## 2015-02-07 MED ORDER — SPIRONOLACTONE 12.5 MG HALF TABLET
12.5000 mg | ORAL_TABLET | Freq: Every day | ORAL | Status: DC
  Administered 2015-02-07: 12.5 mg via ORAL
  Filled 2015-02-07: qty 1

## 2015-02-07 MED ORDER — TRAZODONE HCL 50 MG PO TABS
25.0000 mg | ORAL_TABLET | Freq: Every evening | ORAL | Status: DC | PRN
  Administered 2015-02-12 – 2015-02-13 (×3): 50 mg via ORAL
  Filled 2015-02-07 (×3): qty 1

## 2015-02-07 MED ORDER — GLUCERNA SHAKE PO LIQD: 237.0000 mL | Freq: Three times a day (TID) | ORAL | Status: DC

## 2015-02-07 MED ORDER — ALUM & MAG HYDROXIDE-SIMETH 200-200-20 MG/5ML PO SUSP: 30.0000 mL | ORAL | Status: DC | PRN

## 2015-02-07 MED ORDER — SODIUM CHLORIDE 0.9 % IJ SOLN: 3.0000 mL | Freq: Two times a day (BID) | INTRAMUSCULAR | Status: DC

## 2015-02-07 MED ORDER — IVABRADINE HCL 5 MG PO TABS
5.0000 mg | ORAL_TABLET | Freq: Two times a day (BID) | ORAL | Status: DC
  Administered 2015-02-08 – 2015-02-09 (×3): 5 mg via ORAL
  Filled 2015-02-07 (×5): qty 1

## 2015-02-07 MED ORDER — WARFARIN SODIUM 7.5 MG PO TABS
7.5000 mg | ORAL_TABLET | Freq: Every day | ORAL | Status: DC
  Administered 2015-02-08 – 2015-02-11 (×4): 7.5 mg via ORAL
  Filled 2015-02-07 (×5): qty 1

## 2015-02-07 MED ORDER — SPIRONOLACTONE 25 MG PO TABS: 12.5000 mg | ORAL_TABLET | Freq: Every day | ORAL | Status: DC

## 2015-02-07 MED ORDER — SPIRONOLACTONE 12.5 MG HALF TABLET
12.5000 mg | ORAL_TABLET | Freq: Every day | ORAL | Status: DC
  Administered 2015-02-08 – 2015-02-14 (×7): 12.5 mg via ORAL
  Filled 2015-02-07 (×9): qty 1

## 2015-02-07 MED ORDER — INSULIN ASPART 100 UNIT/ML ~~LOC~~ SOLN: 0.0000 [IU] | Freq: Every day | SUBCUTANEOUS | Status: DC

## 2015-02-07 MED ORDER — BISACODYL 10 MG RE SUPP: 10.0000 mg | Freq: Every day | RECTAL | Status: DC | PRN

## 2015-02-07 MED ORDER — SENNOSIDES-DOCUSATE SODIUM 8.6-50 MG PO TABS
2.0000 | ORAL_TABLET | Freq: Two times a day (BID) | ORAL | Status: DC
  Administered 2015-02-07 – 2015-02-14 (×12): 2 via ORAL
  Filled 2015-02-07 (×14): qty 2

## 2015-02-07 MED ORDER — ATORVASTATIN CALCIUM 20 MG PO TABS
20.0000 mg | ORAL_TABLET | Freq: Every day | ORAL | Status: DC
Start: 2015-02-08 — End: 2015-02-14
  Administered 2015-02-08 – 2015-02-13 (×6): 20 mg via ORAL
  Filled 2015-02-07 (×7): qty 1

## 2015-02-07 MED ORDER — ACETAMINOPHEN 500 MG PO TABS: 500.0000 mg | ORAL_TABLET | Freq: Four times a day (QID) | ORAL | Status: DC | PRN

## 2015-02-07 MED ORDER — GUAIFENESIN-DM 100-10 MG/5ML PO SYRP: 5.0000 mL | ORAL_SOLUTION | Freq: Four times a day (QID) | ORAL | Status: DC | PRN

## 2015-02-07 MED ORDER — FENTANYL CITRATE (PF) 100 MCG/2ML IJ SOLN
INTRAMUSCULAR | Status: AC
  Filled 2015-02-07: qty 2

## 2015-02-07 NOTE — Discharge Summary (Signed)
Discharge Summary  Dennis [name] GLO:[id number] DOB: 02/20/1961  PCP: Mauricio Po, FNP  Admit date: 02/01/2015 Discharge date: 02/07/2015  Time spent: >60mns  Recommendations for Outpatient Follow-up:  1. Patient is to be discharged to CIR 2. F/u with PMD//neurology/cardiology once discharged from CIR  Discharge Diagnoses:  Active Hospital Problems   Diagnosis Date Noted  . HLD (hyperlipidemia)   . Status post PICC central line placement   . History of ischemic right MCA stroke   . Type 2 diabetes mellitus with complication   . Acute on chronic systolic congestive heart failure   . Hypotension 01/24/2015    Resolved Hospital Problems   Diagnosis Date Noted Date Resolved  No resolved problems to display.    Discharge Condition: stable  Diet recommendation: heart healthy/carb modified  Filed Weights   02/05/15 1710 02/06/15 0728 02/07/15 0447  Weight: 70.625 kg (155 lb 11.2 oz) 71.351 kg (157 lb 4.8 oz) 70.988 kg (156 lb 8 oz)    History of present illness:  Dennis Bastidasis a 54y.o. male with prior h/o chronic systolic heart failure, OSA, type 2 DM, s/p ICD, non compliance to medications, came in for slurred speech , was found to have left dense MCA territory infarct. He was started on coumadin for secondary stroke prevention and later on discharged to acute rehab. His baseline bp parameters have been between systolic between 95 to 1188'C. His BP parameters have been running borderline since yesterday and he also reports some SOB on 4/20. Dr BHaroldine Lawswas consulted and IV lasix was started. Earlier this am pt 's BP dropped to 70/40's and we were consulted to transfer the patient to SDU with cardiology as consults.  On further questioning the patient, he denies any other complaints except feeling weak. He reports he feels better than yesterday and has been breathing better. He denies any chest pain, sob, palpitations, nausea, vomiting, abdominal pain, or dizziness.  He will  be admitted to SDU and heart failure team to follow.   Hospital Course:  Active Problems:   Hypotension   Acute on chronic systolic congestive heart failure   Status post PICC central line placement   History of ischemic right MCA stroke   Type 2 diabetes mellitus with complication   HLD (hyperlipidemia) 1-Acute on chronic systolic congestive heart failure/hypotension: patient with increased dyspnea and tachycardia while at CIR -BP remains low normal and CVP 3-7 -no signs of significant fluid overload on exam; and is feeling his breathing is better and not SOB overnight -EF 10-15% -patient is S/P ICD placement -not able to use B-blockers or ACE/ARB currently given hypotension -started on ivabradine on 4/23; RHC 4/27demonstrated normal cardiac output, cards recommended continue ivabradine, digoxin, start low dose spironolactone. daily weights, strict I's and O's. Cards cleared patient to be discharged back to CIR -digoxin level to be checked on 4/28.  2-diabetes type 2 uncontrolled: with last A1C of 13.3 -continue carb modified diet -continue lantus/SSI -much better with current regimen; monitor and adjust further as needed  3-HTN: BP remains soft, but stable -unable to tolerate any of his heart medications at this point -will hold all antihypertensive agents -patient with TED hoses in place, especially for when he is standing   4-HLD: continue statins  5-Right MCA stroke (01/2015): with left side residual weakness -continue risk factors modification -patient is on coumadin  -INR therapeutic; pharmacy adjusting  -readmission to CIR on 4/27  Code Status: Full Family Communication: wife and mother at bedside  Disposition  Plan: readmission to CIR   Consultants:  Heart failure team  CIR  Procedures: ECHO: 01/26/15 RHC 4/27   Discharge Exam: BP 97/70 mmHg  Pulse 89  Temp(Src) 97.9 F (36.6 C) (Oral)  Resp 18  Ht '6\' 1"'$  (1.854 m)  Wt 70.988 kg (156 lb 8 oz)  BMI  20.65 kg/m2  SpO2 100%  General: Alert, awake, oriented x3, in no acute distress. Denies CP and/or palpitations. Patient felt SOB overnight again and also with SOB with exertion during PT assessment. He is on 2 L of oxygen supplementation. No fever.  HEENT: No bruits, no goiter; no JVD seen Heart: slightly tachycardic, no rubs or gallops; positive soft murmur appreciated on exam. Sinus rhythm  Lungs: Good air movement, no wheezing, no frank crackles Abdomen: Soft, nontender, nondistended, positive bowel sounds.  Neuro: no new focal deficit; no dysarthria and with positive left side weakness/deficit from recent stroke. Patient feels is a little better. (MS 3/5 left upper and lower extremity; stable and unchanged on exam)    Discharge Instructions You were cared for by a hospitalist during your hospital stay. If you have any questions about your discharge medications or the care you received while you were in the hospital after you are discharged, you can call the unit and asked to speak with the hospitalist on call if the hospitalist that took care of you is not available. Once you are discharged, your primary care physician will handle any further medical issues. Please note that NO REFILLS for any discharge medications will be authorized once you are discharged, as it is imperative that you return to your primary care physician (or establish a relationship with a primary care physician if you do not have one) for your aftercare needs so that they can reassess your need for medications and monitor your lab values.  Discharge Instructions    Diet - low sodium heart healthy    Complete by:  As directed      Diet - low sodium heart healthy    Complete by:  As directed      Increase activity slowly    Complete by:  As directed      Increase activity slowly    Complete by:  As directed             Medication List    STOP taking these medications        carvedilol 3.125 MG tablet    Commonly known as:  COREG     furosemide 40 MG tablet  Commonly known as:  LASIX      TAKE these medications        acetaminophen 500 MG tablet  Commonly known as:  TYLENOL  Take 1 tablet (500 mg total) by mouth every 6 (six) hours as needed for moderate pain.     atorvastatin 20 MG tablet  Commonly known as:  LIPITOR  Take 1 tablet (20 mg total) by mouth daily at 6 PM.     digoxin 0.25 MG tablet  Commonly known as:  LANOXIN  Take 1 tablet (0.25 mg total) by mouth daily.     feeding supplement (GLUCERNA SHAKE) Liqd  Take 237 mLs by mouth 3 (three) times daily between meals.     glucose blood test strip  Commonly known as:  ONETOUCH VERIO  Use as instructed     insulin aspart 100 UNIT/ML injection  Commonly known as:  NOVOLOG  - Before each meal 3 times a day, 140-199 -  2 units, 200-250 - 4 units, 251-299 - 6 units,  300-349 - 8 units,  350 or above 10 units.  - Dispense syringes and needles as needed, Ok to switch to PEN if approved.     Insulin Glargine 100 UNIT/ML Solostar Pen  Commonly known as:  LANTUS  Inject 25 Units into the skin daily at 10 pm.     ivabradine 5 MG Tabs tablet  Commonly known as:  CORLANOR  Take 1 tablet (5 mg total) by mouth 2 (two) times daily with a meal.     metFORMIN 500 MG tablet  Commonly known as:  GLUCOPHAGE  Take 500 mg by mouth 2 (two) times daily with a meal.     QC PEN NEEDLES 31G X 6 MM Misc  Generic drug:  Insulin Pen Needle  1 each by Other route See admin instructions. Use daily with insulin.     spironolactone 25 MG tablet  Commonly known as:  ALDACTONE  Take 0.5 tablets (12.5 mg total) by mouth daily.     warfarin 10 MG tablet  Commonly known as:  COUMADIN  Take 10 mg by mouth daily.       No Known Allergies    The results of significant diagnostics from this hospitalization (including imaging, microbiology, ancillary and laboratory) are listed below for reference.    Significant Diagnostic Studies: Ct  Angio Head W/cm &/or Wo Cm  01/25/2015   CLINICAL DATA:  54 year old male stroke patient with left side weakness and difficulty swallowing. Not a candidate for thrombolysis. Cannot have MRI. Initial encounter.  EXAM: CT ANGIOGRAPHY HEAD AND NECK  TECHNIQUE: Multidetector CT imaging of the head and neck was performed using the standard protocol during bolus administration of intravenous contrast. Multiplanar CT image reconstructions and MIPs were obtained to evaluate the vascular anatomy. Carotid stenosis measurements (when applicable) are obtained utilizing NASCET criteria, using the distal internal carotid diameter as the denominator.  CONTRAST:  16m OMNIPAQUE IOHEXOL 350 MG/ML SOLN  COMPARISON:  Head CT without contrast 01/24/2015.  FINDINGS: CT HEAD  Brain: Evolved right MCA anterior division infarct with progressive gray and white matter hypodensity corresponding to the insula and anterior operculum, in the area questioned by CT yesterday. No acute intracranial hemorrhage identified. No associated mass effect. No ventriculomegaly. Stable gray-white matter differentiation elsewhere.  Calvarium and skull base: Negative.  Paranasal sinuses: Visualized paranasal sinuses and mastoids are clear.  Orbits: Negative.  CTA NECK  Other neck: Left chest cardiac pacemaker with mild streak artifact. Paraseptal and centrilobular emphysema. No superior mediastinal lymphadenopathy.  Thyroid, larynx, pharynx, parapharyngeal spaces, retropharyngeal space and sublingual space are within normal limits.  Diffuse increased attenuation of subcutaneous fat and superficial soft tissues such that there is portal in a shin of the submandibular and parotid glands, as well as the muscle layers. No cervical lymphadenopathy identified.  Skeleton: Degenerative changes in the cervical spine. No acute osseous abnormality identified.  Aortic arch: 3 vessel arch configuration. Mild distal arch calcified plaque. No great vessel origin stenosis.   Right carotid system: Negative right CCA. Widely patent right carotid bifurcation with minimal calcified plaque (series 504, image 99). Negative cervical right ICA.  Left carotid system: Negative left CCA. Negative left carotid bifurcation. Negative cervical left ICA.  Vertebral arteries:  No proximal right subclavian artery stenosis. Paravertebral venous contrast reflux obscures the proximal right vertebral artery and its origin. The right V2 segment is normal. Negative right vertebral artery at the skullbase.  No proximal left subclavian  artery stenosis. Non dominant appearing left vertebral artery has a normal origin, and is normal to the skullbase.  CTA HEAD  Posterior circulation: Mildly dominant distal right vertebral artery. Normal PICA origins. Normal vertebrobasilar junction. No basilar artery stenosis. Normal SCA and left PCA origins. Fetal type right PCA origin. Left posterior communicating artery is diminutive or absent. Bilateral PCA branches are within normal limits.  Anterior circulation: Negative left ICA siphon. Normal left ophthalmic artery origin. Negative right ICA siphon. Normal right ophthalmic and posterior communicating artery origins. Normal carotid termini. Normal MCA and ACA origins. Diminutive or absent anterior communicating artery. Bilateral ACA branches are within normal limits. Left MCA branches are within normal limits.  Right MCA origin is normal and M1 segment is patent. Right MCA bifurcation is patent. There is then filling defect and irregularity of proximal right MCA M2 branches, with subtotal loss of enhancement of the proximal anterior sylvian division best seen on series 506, image 16. Despite this, there is preserved more distal right M2 and M3 enhancement. No major right MCA branch occlusion is identified.  Venous sinuses: Negative.  Anatomic variants: Mildly dominant right vertebral artery. Fetal type right PCA origin.  Delayed phase: No abnormal enhancement identified.   IMPRESSION: 1. Evolving anterior division right MCA infarct. No hemorrhage or mass effect. 2. Associated filling defect just beyond the right MCA bifurcation affecting the proximal right M2 branches compatible with thromboembolic disease. Despite this, no major right MCA branch occlusion is identified. 3. Otherwise negative intracranial CTA. 4. Minimal atherosclerosis in the neck and aortic arch. 5. Emphysema.   Electronically Signed   By: Genevie Ann M.D.   On: 01/25/2015 14:31   Dg Chest 2 View  01/31/2015   CLINICAL DATA:  Shortness of breath, left-sided weakness. Stroke glass Wednesday.  EXAM: CHEST  2 VIEW  COMPARISON:  01/26/2015  FINDINGS: Left AICD remains in place, unchanged. Heart is upper limits normal in size. New airspace opacities in the perihilar regions and lower lobes, right slightly greater than left. This presumably represents edema although infection cannot be excluded. No effusions. No acute bony abnormality.  IMPRESSION: Bilateral perihilar and lower lobe airspace opacities, most likely edema. Recommend clinical correlation to exclude pneumonia.   Electronically Signed   By: Rolm Baptise M.D.   On: 01/31/2015 10:49   Ct Head Wo Contrast  01/24/2015   CLINICAL DATA:  Awoke with left-sided weakness and difficulty swallowing.  EXAM: CT HEAD WITHOUT CONTRAST  TECHNIQUE: Contiguous axial images were obtained from the base of the skull through the vertex without intravenous contrast.  COMPARISON:  None.  FINDINGS: The brainstem and cerebellum are unremarkable. I think there is debatable loss of gray-white differentiation in the insular region on the right. There may be early change in the posterior frontal region. Basal ganglia appear intact. No sign of hemorrhage, hydrocephalus or extra-axial collection. No calvarial abnormality. Visualized sinuses are clear.  IMPRESSION: Question loss of gray-white differentiation in the insula and posterior frontal region on the right that could go along with  the earliest CT manifestation of acute infarction in the right middle cerebral artery territory. No hemorrhage.   Electronically Signed   By: Nelson Chimes M.D.   On: 01/24/2015 16:08   Ct Angio Neck W/cm &/or Wo/cm  01/25/2015   CLINICAL DATA:  54 year old male stroke patient with left side weakness and difficulty swallowing. Not a candidate for thrombolysis. Cannot have MRI. Initial encounter.  EXAM: CT ANGIOGRAPHY HEAD AND NECK  TECHNIQUE: Multidetector CT  imaging of the head and neck was performed using the standard protocol during bolus administration of intravenous contrast. Multiplanar CT image reconstructions and MIPs were obtained to evaluate the vascular anatomy. Carotid stenosis measurements (when applicable) are obtained utilizing NASCET criteria, using the distal internal carotid diameter as the denominator.  CONTRAST:  37m OMNIPAQUE IOHEXOL 350 MG/ML SOLN  COMPARISON:  Head CT without contrast 01/24/2015.  FINDINGS: CT HEAD  Brain: Evolved right MCA anterior division infarct with progressive gray and white matter hypodensity corresponding to the insula and anterior operculum, in the area questioned by CT yesterday. No acute intracranial hemorrhage identified. No associated mass effect. No ventriculomegaly. Stable gray-white matter differentiation elsewhere.  Calvarium and skull base: Negative.  Paranasal sinuses: Visualized paranasal sinuses and mastoids are clear.  Orbits: Negative.  CTA NECK  Other neck: Left chest cardiac pacemaker with mild streak artifact. Paraseptal and centrilobular emphysema. No superior mediastinal lymphadenopathy.  Thyroid, larynx, pharynx, parapharyngeal spaces, retropharyngeal space and sublingual space are within normal limits.  Diffuse increased attenuation of subcutaneous fat and superficial soft tissues such that there is portal in a shin of the submandibular and parotid glands, as well as the muscle layers. No cervical lymphadenopathy identified.  Skeleton:  Degenerative changes in the cervical spine. No acute osseous abnormality identified.  Aortic arch: 3 vessel arch configuration. Mild distal arch calcified plaque. No great vessel origin stenosis.  Right carotid system: Negative right CCA. Widely patent right carotid bifurcation with minimal calcified plaque (series 504, image 99). Negative cervical right ICA.  Left carotid system: Negative left CCA. Negative left carotid bifurcation. Negative cervical left ICA.  Vertebral arteries:  No proximal right subclavian artery stenosis. Paravertebral venous contrast reflux obscures the proximal right vertebral artery and its origin. The right V2 segment is normal. Negative right vertebral artery at the skullbase.  No proximal left subclavian artery stenosis. Non dominant appearing left vertebral artery has a normal origin, and is normal to the skullbase.  CTA HEAD  Posterior circulation: Mildly dominant distal right vertebral artery. Normal PICA origins. Normal vertebrobasilar junction. No basilar artery stenosis. Normal SCA and left PCA origins. Fetal type right PCA origin. Left posterior communicating artery is diminutive or absent. Bilateral PCA branches are within normal limits.  Anterior circulation: Negative left ICA siphon. Normal left ophthalmic artery origin. Negative right ICA siphon. Normal right ophthalmic and posterior communicating artery origins. Normal carotid termini. Normal MCA and ACA origins. Diminutive or absent anterior communicating artery. Bilateral ACA branches are within normal limits. Left MCA branches are within normal limits.  Right MCA origin is normal and M1 segment is patent. Right MCA bifurcation is patent. There is then filling defect and irregularity of proximal right MCA M2 branches, with subtotal loss of enhancement of the proximal anterior sylvian division best seen on series 506, image 16. Despite this, there is preserved more distal right M2 and M3 enhancement. No major right MCA  branch occlusion is identified.  Venous sinuses: Negative.  Anatomic variants: Mildly dominant right vertebral artery. Fetal type right PCA origin.  Delayed phase: No abnormal enhancement identified.  IMPRESSION: 1. Evolving anterior division right MCA infarct. No hemorrhage or mass effect. 2. Associated filling defect just beyond the right MCA bifurcation affecting the proximal right M2 branches compatible with thromboembolic disease. Despite this, no major right MCA branch occlusion is identified. 3. Otherwise negative intracranial CTA. 4. Minimal atherosclerosis in the neck and aortic arch. 5. Emphysema.   Electronically Signed   By: HHerminio HeadsD.  On: 01/25/2015 14:31   Dg Chest Port 1 View  02/02/2015   CLINICAL DATA:  PICC line placement.  Pneumonia, CHF, diabetes  EXAM: PORTABLE CHEST - 1 VIEW  COMPARISON:  02/01/2015  FINDINGS: Left pacer remains in place, unchanged. Heart is enlarged. No confluent opacities or effusions. No acute bony abnormality. Right PICC line is in place with the tip in the SVC.  IMPRESSION: Right PICC line tip in the SVC.  Cardiomegaly.  No active cardiopulmonary disease.   Electronically Signed   By: Rolm Baptise M.D.   On: 02/02/2015 00:01   Dg Chest Port 1 View  02/01/2015   CLINICAL DATA:  Hypotension  EXAM: PORTABLE CHEST - 1 VIEW  COMPARISON:  01/31/2015  FINDINGS: Cardiac shadow is stable. A pacing device is again seen and stable. Persistent increased density is noted in the right medial lung base consistent with acute infiltrate. The perihilar changes seen previously have resolved in the interval.  IMPRESSION: Persistent right medial lung base changes   Electronically Signed   By: Inez Catalina M.D.   On: 02/01/2015 13:22   Dg Chest Port 1 View  01/26/2015   CLINICAL DATA:  Shortness of breath, history chronic systolic heart failure, ischemic cardiomyopathy, type 2 diabetes, hypercholesterolemia, smoker  EXAM: PORTABLE CHEST - 1 VIEW  COMPARISON:  Portable exam 0826  hours compared to 12/03/2014  FINDINGS: LEFT subclavian sequential pacemaker leads project over RIGHT atrium and RIGHT ventricle.  Upper normal heart size.  Atherosclerotic calcification aorta.  Mediastinal contours and pulmonary vascularity normal.  Emphysematous changes without infiltrate, pleural effusion or pneumothorax.  No acute osseous findings.  IMPRESSION: No acute abnormalities.   Electronically Signed   By: Lavonia Dana M.D.   On: 01/26/2015 08:32   Dg Swallowing Func-speech Pathology  01/27/2015   Levi Aland, CCC-SLP     01/27/2015  2:03 PM   Objective Swallowing Evaluation: Modified Barium Swallowing Study   Patient Details  Name: Tomi Paddock MRN: [id number] Date of Birth: [birth year]  Today's Date: 01/27/2015 Time:  -     Past Medical History:  Past Medical History  Diagnosis Date  . Chronic systolic heart failure     a) Mixed ICM/NICM b) RHC (05/2014): RA 2, RV 19/2/3, PA 22/14  (18), PCWP 6, Fick CO/CI: 5.2 / 2.7, PVR 2.3 WU, PA 60% and 64%  c) ECHO (05/2014): EF 20-25%, diff HK, akinesis entireanteroseptal  myocardium, triv AI, mod MR, LA mod/sev dilated  . Automatic implantable cardioverter-defibrillator in situ   . Heart murmur   . Sleep apnea     "cleared after T&A"  . Left ventricular noncompaction   . Ischemic cardiomyopathy     a) Coronary angiography (12/2008) at [facility]: Lmain: nl, LAD mid  100% stenosis with left to left and right-to-left collaterals to  the distal LAD; Lcx: nl, RCA nl.    . Type II diabetes mellitus   . High cholesterol   . Pneumonia 1988  . Drug abuse and dependence   . CHF (congestive heart failure)    Past Surgical History:  Past Surgical History  Procedure Laterality Date  . Forearm fracture surgery Left 1980  . Cardiac defibrillator placement  03/2011  . Cholecystectomy  1994  . Right heart catheterization N/A 05/30/2014    Procedure: RIGHT HEART CATH;  Surgeon: Jolaine Artist, MD;   Location: [facility] CATH LAB;  Service: Cardiovascular;  Laterality:  N/A;  . Fracture  surgery    . Cardiac catheterization  05/2014  .  Tonsillectomy and adenoidectomy  ~ 1998   HPI:  Jeremaine Soules is a 54 y.o. male with history of chronic systolic  HF, OSA, DM type 2, Left ventricular non-compaction on chronic  coumadin, s/p ICD, medication non-compliance; who was found down  by family with slurred speech, left facial droop and left sided  weakness. Blood sugar- 536 and UDS positive for cocaine. CT head  with early signs of R-MCA infarct. Swallow evaluation done  revealing mild oropharyngeal dysphagia and patient placed on  DIII, nectar liquids. 2D echo done today. Cognitive evaluation  done revealing impulsivity as well as subtle short term memory  deficits. CTA head/neck done revealing evolving anterior  division right MCA infarct. Dr. Erlinda Hong recommended resuming  Coumadin for embolic stroke due to low EF. He has had complaints  of SOB and was treated with dose of IV lasix. CXR with NAD.  Patient with resultant Left hemiparesis, left inattention,  fluctuating bouts of lethargy as well as poor insight/awareness     Recommendation/Prognosis  Clinical Impression:   Dysphagia Diagnosis: Mild oral phase dysphagia Clinical impression: Pt presents with mild neuorgenic oral  dysphagia. Impairments characterized by decreased bolus control  and premature spillage of thin liquids to level of pyriform  sinuses with larger cup sips and use of straw. However adequate  airway protection evidenced throughout study with no penetration  or aspiration with all consistencies trialed. Mild vallecular  residue noted that was cleared with additional swallow uncued by  clinician. Recommend regluar thin diet; meds whole with thin,  straws allowed. Intermittent superivision recommended and set up  assistance with meals secondary to residual left upper extremity  hemiparesis.    Swallow Evaluation Recommendations:  Diet Recommendations: Regular Liquid Administration via: Cup;Straw Medication Administration: Whole meds  with liquid Supervision: Intermittent supervision to cue for compensatory  strategies Compensations: Slow rate;Small sips/bites Postural Changes and/or Swallow Maneuvers: Seated upright 90  degrees Oral Care Recommendations: Oral care BID Follow up Recommendations: None    Prognosis:  Prognosis for Safe Diet Advancement: Good   Individuals Consulted: Consulted and Agree with Results and  Recommendations: Patient      SLP Assessment/Plan  Plan:         General: Date of Onset: 01/26/15 Type of Study: Modified Barium Swallowing Study Reason for Referral: Objectively evaluate swallowing function Diet Prior to this Study: Dysphagia 3 (soft);Nectar-thick liquids Respiratory Status: Room air Behavior/Cognition: Alert Oral Cavity - Dentition: Adequate natural dentition Self-Feeding Abilities: Needs set up;Able to feed self Patient Positioning: Upright in chair Baseline Vocal Quality: Clear Volitional Cough: Weak Volitional Swallow: Able to elicit Anatomy: Within functional limits   Reason for Referral:   Objectively evaluate swallowing function    Oral Phase: Oral Preparation/Oral Phase Oral Phase: Impaired Oral - Thin Oral - Thin Teaspoon: Weak lingual manipulation;Lingual/palatal  residue Oral - Thin Cup: Weak lingual manipulation Oral - Thin Straw: Weak lingual manipulation Oral - Solids Oral - Puree: Delayed oral transit;Weak lingual manipulation Oral - Regular: Delayed oral transit;Weak lingual  manipulation;Lingual/palatal residue Oral - Pill: Weak lingual manipulation Oral Phase - Comment Oral Phase - Comment: mild oral phase impairment   Pharyngeal Phase:  Pharyngeal Phase Pharyngeal Phase: Impaired Pharyngeal - Thin Pharyngeal - Thin Teaspoon: Within functional limits Pharyngeal - Thin Cup: Within functional limits Pharyngeal - Thin Straw: Premature spillage to pyriform sinuses Pharyngeal - Solids Pharyngeal - Puree: Pharyngeal residue - valleculae Pharyngeal - Regular: Pharyngeal residue - valleculae Pharyngeal -  Pill: Premature spillage to valleculae   Cervical  Esophageal Phase      GN         Arvil Chaco MA, CCC-SLP Acute Care Speech Language Pathologist    Levi Aland 01/27/2015, 2:01 PM                     Microbiology: Recent Results (from the past 240 hour(s))  Culture, Urine     Status: None   Collection Time: 01/30/15  4:50 PM  Result Value Ref Range Status   Specimen Description URINE, CLEAN CATCH  Final   Special Requests NONE  Final   Colony Count NO GROWTH Performed at [facility]   Final   Culture NO GROWTH Performed at [facility]   Final   Report Status 02/01/2015 FINAL  Final  MRSA PCR Screening     Status: None   Collection Time: 02/01/15  1:54 PM  Result Value Ref Range Status   MRSA by PCR NEGATIVE NEGATIVE Final    Comment:        The GeneXpert MRSA Assay (FDA approved for NASAL specimens only), is one component of a comprehensive MRSA colonization surveillance program. It is not intended to diagnose MRSA infection nor to guide or monitor treatment for MRSA infections.      Labs: Basic Metabolic Panel:  Recent Labs Lab 02/01/15 1236 02/02/15 0956 02/04/15 0530  NA 138 135 135  K 3.7 4.3 4.4  CL 100 99 100  CO2 '30 28 28  '$ GLUCOSE 62* 331* 277*  BUN '18 16 18  '$ CREATININE 1.08 1.04 0.93  CALCIUM 9.1 8.6 8.7   Liver Function Tests: No results for input(s): AST, ALT, ALKPHOS, BILITOT, PROT, ALBUMIN in the last 168 hours. No results for input(s): LIPASE, AMYLASE in the last 168 hours. No results for input(s): AMMONIA in the last 168 hours. CBC:  Recent Labs Lab 02/01/15 1415 02/05/15 0618  WBC 8.0 8.9  HGB 12.5* 10.7*  HCT 38.4* 33.3*  MCV 95.0 96.0  PLT 204 231   Cardiac Enzymes: No results for input(s): CKTOTAL, CKMB, CKMBINDEX, TROPONINI in the last 168 hours. BNP: BNP (last 3 results)  Recent Labs  12/03/14 0351 01/24/15 2005  BNP 321.1* 166.1*    ProBNP (last 3 results)  Recent Labs  08/13/14 1850  09/21/14 1928 10/02/14 1033  PROBNP 1525.0* 1870.0* 1085.0*    CBG:  Recent Labs Lab 02/07/15 0716 02/07/15 0740 02/07/15 0940 02/07/15 1011 02/07/15 1107  GLUCAP 46* 120* 66* 73 121*       Signed:  Xu,Fang MD, PhD  [facility] 02/07/2015, 12:53 PM

## 2015-02-07 NOTE — Progress Notes (Signed)
Pt arrived from cath lab with Dsg. To Rt neck, D&I.  Denies pain/discomfort.  Call bell at reach.  Instructed to call for assistance.  Family at bed side.  Will continue to monitor.  Karie Kirks, Therapist, sports.

## 2015-02-07 NOTE — Progress Notes (Signed)
ANTICOAGULATION CONSULT NOTE - Follow Up Consult  Pharmacy Consult for warfarin Indication: LV noncompaction  No Known Allergies  Patient Measurements: Height: '6\' 1"'$  (185.4 cm) Weight: 156 lb 8 oz (70.988 kg) IBW/kg (Calculated) : 79.9  Vital Signs: Temp: 97.9 F (36.6 C) (04/27 0447) Temp Source: Oral (04/27 0447) BP: 97/70 mmHg (04/27 1120) Pulse Rate: 89 (04/27 1120)  Labs:  Recent Labs  02/05/15 0618 02/07/15 0440  HGB 10.7*  --   HCT 33.3*  --   PLT 231  --   LABPROT 27.1* 27.3*  INR 2.48* 2.52*    Estimated Creatinine Clearance: 93.3 mL/min (by C-G formula based on Cr of 0.93).   Medications:  Scheduled:  . atorvastatin  20 mg Oral q1800  . digoxin  0.25 mg Oral Daily  . feeding supplement (GLUCERNA SHAKE)  237 mL Oral TID BM  . insulin aspart  0-15 Units Subcutaneous TID WC  . insulin aspart  3 Units Subcutaneous TID WC  . insulin glargine  35 Units Subcutaneous QHS  . ivabradine  5 mg Oral BID WC  . senna-docusate  2 tablet Oral BID  . sodium chloride  10-40 mL Intracatheter Q12H  . sodium chloride  3 mL Intravenous Q12H  . spironolactone  12.5 mg Oral Daily  . warfarin  7.5 mg Oral q1800  . Warfarin - Pharmacist Dosing Inpatient   Does not apply q1800    Assessment: 53yoM w/ left ventricular noncompation currently on therapeutic coumadin. INR today 2.48. No bleeding noted. Last CBC stable.   AC: Warfarin PTA for hx L ventricular noncompaction -INR 2.5, no bleeding; last CBC 4/21 stable, ASA stopped per neuro rec since INR >2 Last anticoag note 12/01/14>>dose 7.'5mg'$  daily (med rec says '10mg'$  daily?)  ID: Afebrile, WBC WNL, no abx    CV: Hx CHF (EF 10-15%), HLD - BP remains soft (90s/70s), HR high (80s)- asa, lipitor - LDL 116 ,  Dig level tomorrow -dig, corlanor,spiro-no room to add much else  Endo: Hx DM (A1c 13.3) - CBGs elevated on SSI + levemir; getting better (CBG 120)  GI/Nutr: Dysphagia diet - LFTs WNL    Neuro: Hx drug abuse, new  non-dominant R MCA infarct- no tPA d/t delay in arrival; A&O, UDS +cocaine, much more alert past few days  Renal: WNL; SCr 0.93, lytes ok, check bmet in am  Pulm: Hx OSA - 98% RA    Heme/Onc: CBC WNL, cbc in am  Best practices: warfarin  Plan:   Coumadin 7.5 mg po x1 daily for now INR mwf Dig level in am Likely to trx back to rehab  Thank you for allowing pharmacy to be part of this patient's care team  Erin Hearing PharmD., BCPS Clinical Pharmacist Pager [phone] 02/07/2015 11:31 AM

## 2015-02-07 NOTE — Progress Notes (Signed)
PMR Admission Coordinator Pre-Admission Assessment  Patient: Dennis [name] is an 54 y.o., male MRN: [id number] DOB: January 22, 1961 Height: '6\' 1"'$  (185.4 cm) Weight: 70.988 kg (156 lb 8 oz)  Insurance Information HMO: yes PPO: PCP: IPA: 80/20: OTHER: Medicare managed care PRIMARY: [employer] Medicare Policy#: [id number] Subscriber: pt CM Name: Lorel Monaco Phone#: [phone] ext [phone] Fax#: [phone] Pre-Cert#: [id number] approved for 7 days. To be followed by Colbert Coyer at [phone] Employer: disabled Benefits: Phone #: [phone] Name: 02/07/15 Eff. Date: 10/13/14 Deduct: none Out of Pocket Max: $4900 Life Max: none CIR: $345 per day days 1-5 then covers 100% SNF: no copay days 1-20; $160 copay per day days 21-51; no copay days 52-100 Outpatient: $40 copay per visit Co-Pay: no visit limit Home Health: 100% Co-Pay: no visit limit DME: 80% Co-Pay: 20% Providers: in network  SECONDARY: none  Medicaid Application Date: Case Manager: Pt is requesting a medicaid assessment for eligibility Disability Application Date: Case Worker:   Emergency Contact Information Contact Information    Name Relation Home Work Mobile   [place] Mother [phone]     Keith,Pat Significant other   [phone]     Current Medical History  Patient Admitting Diagnosis: R CVA and CHF  History of Present Illness: Dennis [name] is a 54 y.o. 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 Blood sugar- 536, INR 1.02 and UDS positive for cocaine.  CT head with early signs of R-MCA infarct and CTA head/neck done revealing evolving anterior division right MCA infarct. Dr. Erlinda Hong recommended resuming Coumadin for embolic stroke due to low EF. He has had complaints of SOB and was treated with dose of IV lasix. patient with resultant left hemiparesis, left inattention, fluctuating bouts of lethargy as well as poorinsight and awareness  He was admitted to rehab 01/26/2015 for inpatient therapies to consist of PT, ST and OT at least three hours five days a week. Diabetes was monitored with ac/hs checks and Lantus insulin was titrated to 25 for better BS control. He was encouraged to push po fluids due to nectar liquids. He was maintained on lasix every other day but had complaints of worsening of SOB with fatigue due to CHF. He was transferred back to acute for suspected lowput HF on 02/01/15. PICC placed and CVP and co-ox ok but BP too low to titrate HF meds. Ivabradine started and spironolactone added to help with diuresis but this was d/c due to low BP. He developed SOB as well as dyspnea with PT yesterday. Cardiac cath done today revealing mildly elevated left sided pressures with mild pulmonary HTN and normal cardiac output. Dr. Haroldine Laws recommends resuming low dose spironolactone and no need for inotropes at this time. Therapy ongoing and patient continues to be limited by left hemiparesis with LUE inattention, left visual deficits, poor safety awareness with distractibility requiring redirection to tasks. He is admitted today to resume his rehab program.    Past Medical History  Past Medical History  Diagnosis Date  . Chronic systolic heart failure     a) Mixed ICM/NICM b) RHC (05/2014): RA 2, RV 19/2/3, PA 22/14 (18), PCWP 6, Fick CO/CI: 5.2 / 2.7, PVR 2.3 WU, PA 60% and 64% c) ECHO (05/2014): EF 20-25%, diff HK, akinesis entireanteroseptal myocardium, triv AI, mod MR, LA mod/sev dilated  . Automatic implantable  cardioverter-defibrillator  in situ   . Heart murmur   . Sleep apnea     "cleared after T&A"  . Left ventricular noncompaction   . Ischemic cardiomyopathy     a) Coronary angiography (12/2008) at [facility]: Lmain: nl, LAD mid 100% stenosis with left to left and right-to-left collaterals to the distal LAD; Lcx: nl, RCA nl.   . Type II diabetes mellitus   . High cholesterol   . Pneumonia 1988  . Drug abuse and dependence   . CHF (congestive heart failure)     Family History  family history includes Diabetes in his brother, brother, father, and mother; Heart disease in his father and mother; Prostate cancer in his father.  Prior Rehab/Hospitalizations: [facility] 01/26/15- 02/01/15 when returned to acute hospital  Current Medications   Current facility-administered medications:  . 0.9 % sodium chloride infusion, 250 mL, Intravenous, PRN, Jolaine Artist, MD . acetaminophen (TYLENOL) tablet 1,000 mg, 1,000 mg, Oral, Q6H PRN, Hosie Poisson, MD, 1,000 mg at 02/02/15 0115 . acetaminophen (TYLENOL) tablet 650 mg, 650 mg, Oral, Q4H PRN, Jolaine Artist, MD . atorvastatin (LIPITOR) tablet 20 mg, 20 mg, Oral, q1800, Hosie Poisson, MD, 20 mg at 02/06/15 1711 . digoxin (LANOXIN) tablet 0.25 mg, 0.25 mg, Oral, Daily, Ivan Anchors Love, PA-C, 0.25 mg at 02/06/15 1034 . feeding supplement (GLUCERNA SHAKE) (GLUCERNA SHAKE) liquid 237 mL, 237 mL, Oral, TID BM, Hosie Poisson, MD, 237 mL at 02/06/15 2000 . insulin aspart (novoLOG) injection 0-15 Units, 0-15 Units, Subcutaneous, TID WC, Barton Dubois, MD, 2 Units at 02/07/15 [phone] . insulin aspart (novoLOG) injection 3 Units, 3 Units, Subcutaneous, TID WC, Barton Dubois, MD, 3 Units at 02/07/15 0515 . insulin glargine (LANTUS) injection 35 Units, 35 Units, Subcutaneous, QHS, Barton Dubois, MD, 35 Units at 02/06/15 2219 . ivabradine (CORLANOR) tablet 5 mg, 5 mg, Oral, BID WC, Jolaine Artist, MD, 5 mg at 02/07/15  0515 . ondansetron (ZOFRAN) injection 4 mg, 4 mg, Intravenous, Q6H PRN, Jolaine Artist, MD . senna-docusate (Senokot-S) tablet 2 tablet, 2 tablet, Oral, BID, Florencia Reasons, MD, 1 tablet at 02/07/15 1237 . sodium chloride 0.9 % injection 10-40 mL, 10-40 mL, Intracatheter, Q12H, Hosie Poisson, MD, 10 mL at 02/06/15 1000 . sodium chloride 0.9 % injection 10-40 mL, 10-40 mL, Intracatheter, PRN, Hosie Poisson, MD, 10 mL at 02/06/15 1138 . sodium chloride 0.9 % injection 3 mL, 3 mL, Intravenous, Q12H, Shaune Pascal Bensimhon, MD . sodium chloride 0.9 % injection 3 mL, 3 mL, Intravenous, PRN, Jolaine Artist, MD . spironolactone (ALDACTONE) tablet 12.5 mg, 12.5 mg, Oral, Daily, Shaune Pascal Bensimhon, MD . warfarin (COUMADIN) tablet 7.5 mg, 7.5 mg, Oral, q1800, Lyndee Leo, RPH, 7.5 mg at 02/06/15 1711 . Warfarin - Pharmacist Dosing Inpatient, , Does not apply, q1800, Lyndee Leo, [facility]  Patients Current Diet: Diet clear liquid Room service appropriate?: Yes; Fluid consistency:: Thin Diet - low sodium heart healthy Diet - low sodium heart healthy  Precautions / Restrictions Precautions Precautions: Fall Precaution Comments: L inattention, poor insight Restrictions Weight Bearing Restrictions: No   Prior Activity Level Community (5-7x/wk): goes to narcotics anonymous three times per day . Drives and Independent pta. Disabled due to cardiac issued since 2009.  Home Assistive Devices / Equipment Home Assistive Devices/Equipment: None Home Equipment: Tub bench  Prior Functional Level Prior Function Level of Independence: Independent Comments: Pt reports he drives and is independent with IADLs   Current Functional Level Cognition  Arousal/Alertness: Awake/alert Overall Cognitive Status: Within  Functional Limits for tasks assessed Current Attention Level: Alternating Orientation Level: Oriented X4 Safety/Judgement: Decreased awareness of deficits, Decreased awareness of safety General  Comments: Pt with poor insight into deficits other than that he is weaker on the L side. Unable to state any ramifications of his deficits in ADL or mobility.   Extremity Assessment (includes Sensation/Coordination)  Upper Extremity Assessment: Defer to OT evaluation LUE Deficits / Details: Full AROM, 4/5 strength, + drift with vision occluded, uses L UE spontaneously for B UE tasks LUE Coordination: decreased fine motor, decreased gross motor  Lower Extremity Assessment: LLE deficits/detail LLE Deficits / Details: >4/5 MMT with knee extension, hip flexion, ankle DF/PF, and hallux flex/ext. LLE Sensation: decreased proprioception (ankle assessed)    ADLs  Overall ADL's : Needs assistance/impaired Eating/Feeding: Set up, Sitting Grooming: Wash/dry hands, Min guard, Standing Upper Body Bathing: Min guard, Standing Lower Body Bathing: Min guard, Sit to/from stand Upper Body Dressing : Minimal assistance, Sitting (for orientation primarily) Lower Body Dressing: Min guard, Sit to/from stand Toilet Transfer: Min guard, Ambulation, Regular Toilet Toileting- Clothing Manipulation and Hygiene: Min guard, Sit to/from stand Functional mobility during ADLs: Min guard (only ambulated within room) General ADL Comments: Pt with decreased planning and executive function for ADL and IADL.    Mobility  Overal bed mobility: Needs Assistance Bed Mobility: Supine to Sit, Sit to Supine Supine to sit: Supervision Sit to supine: Supervision    Transfers  Overall transfer level: Needs assistance Equipment used: None Transfers: Sit to/from Stand Sit to Stand: Min guard General transfer comment: min guard assist due to impulsive at times with need for steadying assist.     Ambulation / Gait / Stairs / Wheelchair Mobility  Ambulation/Gait Ambulation/Gait assistance: Museum/gallery curator (Feet): 200 Feet Assistive device: None Gait Pattern/deviations: Step-through pattern,  Decreased stride length, Decreased step length - right, Decreased stance time - left, Decreased dorsiflexion - left, Decreased weight shift to left, Drifts right/left (when fatigues, circumducts left LE.) Gait velocity: decreased Gait velocity interpretation: Below normal speed for age/gender General Gait Details: Pt now circumducting left LE to bring through with occaisional drifting due to left hip instability. Buckling noted however pt does appear to self correct. Pt lacks awareness of left UE with it swinging into left LE appearing to throw him off balance at times. Pt went to bathroom and urinated in toilet with min guard assist. Needed min asssist to pick up item from floor. Continues to need cues to slow down and concentrate on tasks as he is impulsive and does not fully attend to tasks.     Posture / Balance Balance Overall balance assessment: Needs assistance Sitting-balance support: No upper extremity supported, Feet supported Sitting balance-Leahy Scale: Good Standing balance support: No upper extremity supported, During functional activity Standing balance-Leahy Scale: Fair Standing balance comment: can stand statically to wash hands but cannot withstand challenges.     Special needs/care consideration Bowel mgmt: [facility] last BM 02/05/15 Bladder mgmt: [facility] Diabetic mgmt Hgb A1C 10.5 on 09/2014. Med noncompliance pt reports 2 weeks pta. HAs aarp medicare with low cpay and receives his cardiac meds from [facility] CHF clinic, BUT pt reports med noncompliance two weeks pta   Previous Home Environment Living Arrangements: Parent Lives With: Family Available Help at Discharge: Family, Available 24 hours/day Type of Home: House Home Layout: Multi-level, 1/2 bath on main level, Bed/bath upstairs Alternate Level Stairs-Rails: Right, Left Alternate Level Stairs-Number of Steps: 12 (first 5 steps rail on R and no  wall L; next 7 steps rail on L and wall on R) Home Access:  Stairs to enter Entrance Stairs-Rails: None Entrance Stairs-Number of Steps: 1 step into back entry Bathroom Shower/Tub: Tub/shower unit, Architectural technologist: Handicapped height Bathroom Accessibility: Yes How Accessible: Accessible via walker Home Care Services: No Additional Comments: Pat, pt's longtime girlfriend, will assist as needed.  Discharge Living Setting Plans for Discharge Living Setting: Lives with (comment), Other (Comment) (Mom) Type of Home at Discharge: House Discharge Home Layout: Multi-level, 1/2 bath on main level, Bed/bath upstairs Alternate Level Stairs-Rails: Right, Left, Can reach both Alternate Level Stairs-Number of Steps: 12 Discharge Home Access: Stairs to enter Entrance Stairs-Rails: None Entrance Stairs-Number of Steps: 1 step in back Discharge Bathroom Shower/Tub: Tub/shower unit, Curtain Discharge Bathroom Toilet: Standard Discharge Bathroom Accessibility: Yes How Accessible: Accessible via walker Does the patient have any problems obtaining your medications?: Yes (Describe) (pt has Retail buyer but also gets meds from [facility] CHF clinic)  Social/Family/Support Systems Patient Roles: Production manager, Other (Comment) (partner) Sport and exercise psychologist Information: Viann Fish, Mom and Terrial Rhodes Anticipated Caregiver: Mom and girlfriend Anticipated Caregiver's Contact Information: see above Ability/Limitations of Caregiver: Mom is elderly and has health issues, [name] can assist Caregiver Availability: Other (Comment) Discharge Plan Discussed with Primary Caregiver: Yes Is Caregiver In Agreement with Plan?: Yes Does Caregiver/Family have Issues with Lodging/Transportation while Pt is in Rehab?: No  Goals/Additional Needs Patient/Family Goal for Rehab: Supervision with PT, OT, and SLP Expected length of stay: ELOS 5 to 7 days to complete his AIR stay Special Service Needs: UDS positive cocaine on admission. Pt states he goes to narcotics anonymous 3 times per day. Mom and GF are  aware that he goes to NA. I did not discuss UDS positive for cocaine on admission. I did tell him he is drug screened each admission. He did not ask for results Pt/Family Agrees to Admission and willing to participate: Yes Program Orientation Provided & Reviewed with Pt/Caregiver Including Roles & Responsibilities: Yes  Decrease burden of Care through IP rehab admission: n/a  Possible need for SNF placement upon discharge:not antcipated  Patient Condition: This patient's medical and functional status has changed since the consult dated: 01/25/15 and readmission to acute hospital from [facility] 02/01/15 in which the Rehabilitation Physician determined and documented that the patient's condition is appropriate for intensive rehabilitative care in an inpatient rehabilitation facility. See "History of Present Illness" (above) for medical update. Functional changes are: overall min assist. Patient's medical and functional status update has been discussed with the Rehabilitation physician and patient remains appropriate for inpatient rehabilitation readmission with medical clearance by Dr. Dillard Cannon and Dr. Haroldine Laws cardiology. Will admit to inpatient rehab today.  Preadmission Screen Completed By: Cleatrice Burke, 02/07/2015 1:11 PM ______________________________________________________________________  Discussed status with Dr. Naaman Plummer on 02/07/2015 at 54 and received telephone approval for admission today.  Admission Coordinator: Cleatrice Burke, time 2876 Date 02/07/2015.          Cosigned by: Meredith Staggers, MD at 02/07/2015 1:49 PM  Revision History     Date/Time User Provider Type Action   02/07/2015 1:49 PM Meredith Staggers, MD Physician Cosign   02/07/2015 1:11 PM Cristina Gong, RN Rehab Admission Coordinator Sign   02/07/2015 1:11 PM Cristina Gong, RN Rehab Admission Coordinator Sign   View Details Report

## 2015-02-07 NOTE — Progress Notes (Signed)
Patient rested quietly last night. Aware of NPO status for Cardiac Cath today.

## 2015-02-07 NOTE — PMR Pre-admission (Signed)
PMR Admission Coordinator Pre-Admission Assessment  Patient: Dennis [name] is an 54 y.o., male MRN: [id number] DOB: 05/21/1961 Height: '6\' 1"'$  (185.4 cm) Weight: 70.988 kg (156 lb 8 oz)              Insurance Information HMO: yes    PPO:      PCP:      IPA:      80/20:      OTHER: Medicare managed care  PRIMARY: A[employer] Medicare       Policy#: [id number]      Subscriber: pt CM Name: Lorel Monaco      Phone#: [phone] ext [phone]     Fax#: [phone] Pre-Cert#: [id number] approved for 7 days. To be followed by Colbert Coyer at [phone]      Employer: disabled Benefits:  Phone #: [phone]     Name: 02/07/15 Eff. Date: 10/13/14     Deduct: none      Out of Pocket Max: $4900      Life Max: none CIR: $345 per day days 1-5 then covers 100%      SNF: no copay days 1-20; $160 copay per day days 21-51; no copay days 52-100 Outpatient: $40 copay per visit     Co-Pay: no visit limit Home Health: 100%      Co-Pay: no visit limit DME: 80%     Co-Pay: 20% Providers: in network  SECONDARY: none     Medicaid Application Date:       Case Manager: Pt is requesting a medicaid assessment for eligibility Disability Application Date:       Case Worker:   Emergency Contact Information Contact Information    Name Relation Home Work Mobile   [place] Mother [phone]     Keith,Pat Significant other   [phone]     Current Medical History  Patient Admitting Diagnosis: R CVA and CHF  History of Present Illness: Dennis [name] is a 54 y.o. 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 Blood sugar- 536, INR 1.02 and UDS positive for cocaine. CT head with early signs of R-MCA infarct and CTA head/neck done revealing evolving anterior division right MCA infarct. Dr. Erlinda Hong recommended resuming  Coumadin for embolic stroke due to low EF. He has had complaints of SOB and was treated with dose of IV lasix. patient with resultant left hemiparesis, left inattention, fluctuating bouts of lethargy as well as poorinsight and awareness  He was admitted to rehab 01/26/2015 for inpatient therapies to consist of PT, ST and OT at least three hours five days a week. Diabetes was monitored with ac/hs checks and Lantus insulin was titrated to 25 for better BS control. He was encouraged to push po fluids due to nectar liquids. He was maintained on lasix every other day but had complaints of worsening of SOB with fatigue due to CHF. He was transferred back to acute for suspected lowput HF on 02/01/15.  PICC placed and CVP and co-ox ok but BP too low to titrate HF meds. Ivabradine started and spironolactone added to help with diuresis but this was d/c due to low BP. He developed SOB as well as dyspnea with PT yesterday. Cardiac cath done today revealing mildly elevated left sided pressures with mild pulmonary HTN and normal cardiac output. Dr. Haroldine Laws recommends resuming low dose spironolactone and no  need for inotropes at this time. Therapy ongoing and patient continues to be limited by left hemiparesis with LUE inattention, left visual deficits, poor safety awareness with distractibility requiring redirection to tasks. He is admitted today to resume his rehab program.    Past Medical History  Past Medical History  Diagnosis Date  . Chronic systolic heart failure     a) Mixed ICM/NICM b) RHC (05/2014): RA 2, RV 19/2/3, PA 22/14 (18), PCWP 6, Fick CO/CI: 5.2 / 2.7, PVR 2.3 WU, PA 60% and 64% c) ECHO (05/2014): EF 20-25%, diff HK, akinesis entireanteroseptal myocardium, triv AI, mod MR, LA mod/sev dilated  . Automatic implantable cardioverter-defibrillator in situ   . Heart murmur   . Sleep apnea     "cleared after T&A"  . Left ventricular noncompaction   . Ischemic cardiomyopathy     a) Coronary  angiography (12/2008) at [facility]: Lmain: nl, LAD mid 100% stenosis with left to left and right-to-left collaterals to the distal LAD; Lcx: nl, RCA nl.    . Type II diabetes mellitus   . High cholesterol   . Pneumonia 1988  . Drug abuse and dependence   . CHF (congestive heart failure)     Family History  family history includes Diabetes in his brother, brother, father, and mother; Heart disease in his father and mother; Prostate cancer in his father.  Prior Rehab/Hospitalizations: [facility] 01/26/15- 02/01/15 when returned to acute hospital  Current Medications   Current facility-administered medications:  .  0.9 %  sodium chloride infusion, 250 mL, Intravenous, PRN, Jolaine Artist, MD .  acetaminophen (TYLENOL) tablet 1,000 mg, 1,000 mg, Oral, Q6H PRN, Hosie Poisson, MD, 1,000 mg at 02/02/15 0115 .  acetaminophen (TYLENOL) tablet 650 mg, 650 mg, Oral, Q4H PRN, Jolaine Artist, MD .  atorvastatin (LIPITOR) tablet 20 mg, 20 mg, Oral, q1800, Hosie Poisson, MD, 20 mg at 02/06/15 1711 .  digoxin (LANOXIN) tablet 0.25 mg, 0.25 mg, Oral, Daily, Ivan Anchors Love, PA-C, 0.25 mg at 02/06/15 1034 .  feeding supplement (GLUCERNA SHAKE) (GLUCERNA SHAKE) liquid 237 mL, 237 mL, Oral, TID BM, Hosie Poisson, MD, 237 mL at 02/06/15 2000 .  insulin aspart (novoLOG) injection 0-15 Units, 0-15 Units, Subcutaneous, TID WC, Barton Dubois, MD, 2 Units at 02/07/15 [phone] .  insulin aspart (novoLOG) injection 3 Units, 3 Units, Subcutaneous, TID WC, Barton Dubois, MD, 3 Units at 02/07/15 0515 .  insulin glargine (LANTUS) injection 35 Units, 35 Units, Subcutaneous, QHS, Barton Dubois, MD, 35 Units at 02/06/15 2219 .  ivabradine (CORLANOR) tablet 5 mg, 5 mg, Oral, BID WC, Jolaine Artist, MD, 5 mg at 02/07/15 0515 .  ondansetron (ZOFRAN) injection 4 mg, 4 mg, Intravenous, Q6H PRN, Jolaine Artist, MD .  senna-docusate (Senokot-S) tablet 2 tablet, 2 tablet, Oral, BID, Florencia Reasons, MD, 1 tablet at 02/07/15 1237 .  sodium  chloride 0.9 % injection 10-40 mL, 10-40 mL, Intracatheter, Q12H, Hosie Poisson, MD, 10 mL at 02/06/15 1000 .  sodium chloride 0.9 % injection 10-40 mL, 10-40 mL, Intracatheter, PRN, Hosie Poisson, MD, 10 mL at 02/06/15 1138 .  sodium chloride 0.9 % injection 3 mL, 3 mL, Intravenous, Q12H, Shaune Pascal Bensimhon, MD .  sodium chloride 0.9 % injection 3 mL, 3 mL, Intravenous, PRN, Jolaine Artist, MD .  spironolactone (ALDACTONE) tablet 12.5 mg, 12.5 mg, Oral, Daily, Shaune Pascal Bensimhon, MD .  warfarin (COUMADIN) tablet 7.5 mg, 7.5 mg, Oral, q1800, Lyndee Leo, RPH, 7.5 mg at 02/06/15  1711 .  Warfarin - Pharmacist Dosing Inpatient, , Does not apply, q1800, Lyndee Leo, [facility]  Patients Current Diet: Diet clear liquid Room service appropriate?: Yes; Fluid consistency:: Thin Diet - low sodium heart healthy Diet - low sodium heart healthy  Precautions / Restrictions Precautions Precautions: Fall Precaution Comments: L inattention, poor insight Restrictions Weight Bearing Restrictions: No   Prior Activity Level Community (5-7x/wk): goes to narcotics anonymous three times per day . Drives and Independent pta. Disabled due to cardiac issued since 2009.  Home Assistive Devices / Equipment Home Assistive Devices/Equipment: None Home Equipment: Tub bench  Prior Functional Level Prior Function Level of Independence: Independent Comments: Pt reports he drives and is independent with IADLs   Current Functional Level Cognition  Arousal/Alertness: Awake/alert Overall Cognitive Status: Within Functional Limits for tasks assessed Current Attention Level: Alternating Orientation Level: Oriented X4 Safety/Judgement: Decreased awareness of deficits, Decreased awareness of safety General Comments: Pt with poor insight into deficits other than that he is weaker on the L side.  Unable to state any ramifications of his deficits in ADL or mobility.    Extremity Assessment (includes  Sensation/Coordination)  Upper Extremity Assessment: Defer to OT evaluation LUE Deficits / Details: Full AROM, 4/5 strength, + drift with vision occluded, uses L UE spontaneously for B UE tasks LUE Coordination: decreased fine motor, decreased gross motor  Lower Extremity Assessment: LLE deficits/detail LLE Deficits / Details: >4/5 MMT with knee extension, hip flexion, ankle DF/PF, and hallux flex/ext. LLE Sensation: decreased proprioception (ankle assessed)    ADLs  Overall ADL's : Needs assistance/impaired Eating/Feeding: Set up, Sitting Grooming: Wash/dry hands, Min guard, Standing Upper Body Bathing: Min guard, Standing Lower Body Bathing: Min guard, Sit to/from stand Upper Body Dressing : Minimal assistance, Sitting (for orientation primarily) Lower Body Dressing: Min guard, Sit to/from stand Toilet Transfer: Min guard, Ambulation, Regular Toilet Toileting- Clothing Manipulation and Hygiene: Min guard, Sit to/from stand Functional mobility during ADLs: Min guard (only ambulated within room) General ADL Comments: Pt with decreased planning and executive function for ADL and IADL.    Mobility  Overal bed mobility: Needs Assistance Bed Mobility: Supine to Sit, Sit to Supine Supine to sit: Supervision Sit to supine: Supervision    Transfers  Overall transfer level: Needs assistance Equipment used: None Transfers: Sit to/from Stand Sit to Stand: Min guard General transfer comment: min guard assist due to impulsive at times with need for steadying assist.      Ambulation / Gait / Stairs / Wheelchair Mobility  Ambulation/Gait Ambulation/Gait assistance: Museum/gallery curator (Feet): 200 Feet Assistive device: None Gait Pattern/deviations: Step-through pattern, Decreased stride length, Decreased step length - right, Decreased stance time - left, Decreased dorsiflexion - left, Decreased weight shift to left, Drifts right/left (when fatigues, circumducts left LE.) Gait  velocity: decreased Gait velocity interpretation: Below normal speed for age/gender General Gait Details: Pt now circumducting left LE to bring through with occaisional drifting due to left hip instability.  Buckling noted however pt does appear to self correct.  Pt lacks awareness of left UE with it swinging into left LE appearing to throw him off balance at times.  Pt went to bathroom and urinated in toilet with min guard assist.  Needed min asssist to pick up item from floor.  Continues to need cues to slow down and concentrate on tasks as he is impulsive and does not fully attend to tasks.     Posture / Balance Balance Overall balance assessment: Needs assistance Sitting-balance  support: No upper extremity supported, Feet supported Sitting balance-Leahy Scale: Good Standing balance support: No upper extremity supported, During functional activity Standing balance-Leahy Scale: Fair Standing balance comment: can stand statically to wash hands but cannot withstand challenges.      Special needs/care consideration Bowel mgmt: [facility] last BM 02/05/15 Bladder mgmt: continent Diabetic mgmt Hgb A1C 10.5 on 09/2014. Med noncompliance pt reports 2 weeks pta. HAs aarp medicare with low cpay and receives his cardiac meds from [facility] clinic, BUT pt reports med noncompliance two weeks pta   Previous Home Environment Living Arrangements: Parent  Lives With: Family Available Help at Discharge: Family, Available 24 hours/day Type of Home: House Home Layout: Multi-level, 1/2 bath on main level, Bed/bath upstairs Alternate Level Stairs-Rails: Right, Left Alternate Level Stairs-Number of Steps: 12 (first 5 steps rail on R and no wall L; next 7 steps rail on L and wall on R) Home Access: Stairs to enter Entrance Stairs-Rails: None Entrance Stairs-Number of Steps: 1 step into back entry Bathroom Shower/Tub: Tub/shower unit, Architectural technologist: Handicapped height Bathroom Accessibility: Yes How  Accessible: Accessible via walker [place]: No Additional Comments: Pat, pt's longtime girlfriend, will assist as needed.  Discharge Living Setting Plans for Discharge Living Setting: Lives with (comment), Other (Comment) (Mom) Type of Home at Discharge: House Discharge Home Layout: Multi-level, 1/2 bath on main level, Bed/bath upstairs Alternate Level Stairs-Rails: Right, Left, Can reach both Alternate Level Stairs-Number of Steps: 12 Discharge Home Access: Stairs to enter Entrance Stairs-Rails: None Entrance Stairs-Number of Steps: 1 step in back Discharge Bathroom Shower/Tub: Tub/shower unit, Curtain Discharge Bathroom Toilet: Standard Discharge Bathroom Accessibility: Yes How Accessible: Accessible via walker Does the patient have any problems obtaining your medications?: Yes (Describe) (pt has Retail buyer but also gets meds from [facility] CHF clinic)  Social/Family/Support Systems Patient Roles: Production manager, Other (Comment) (partner) Sport and exercise psychologist Information: Viann Fish, Mom and Terrial Rhodes Anticipated Caregiver: Mom and girlfriend Anticipated Caregiver's Contact Information: see above Ability/Limitations of Caregiver: Mom is elderly and has health issues, [name] can assist Caregiver Availability: Other (Comment) Discharge Plan Discussed with Primary Caregiver: Yes Is Caregiver In Agreement with Plan?: Yes Does Caregiver/Family have Issues with Lodging/Transportation while Pt is in Rehab?: No  Goals/Additional Needs Patient/Family Goal for Rehab: Supervision with PT, OT, and SLP Expected length of stay: ELOS 5 to 7 days to complete his AIR stay Special Service Needs: UDS positive cocaine on admission. Pt states he goes to narcotics anonymous 3 times per day. Mom and GF are aware that he goes to NA. I did not discuss UDS positive for cocaine on admission. I did tell him he is drug screened each admission. He did not ask for results Pt/Family Agrees to Admission and willing to participate:  Yes Program Orientation Provided & Reviewed with Pt/Caregiver Including Roles  & Responsibilities: Yes  Decrease burden of Care through IP rehab admission: n/a  Possible need for SNF placement upon discharge:not antcipated  Patient Condition: This patient's medical and functional status has changed since the consult dated: 01/25/15 and readmission to acute hospital from [place] 02/01/15 in which the Rehabilitation Physician determined and documented that the patient's condition is appropriate for intensive rehabilitative care in an inpatient rehabilitation facility. See "History of Present Illness" (above) for medical update. Functional changes are: overall min assist. Patient's medical and functional status update has been discussed with the Rehabilitation physician and patient remains appropriate for inpatient rehabilitation readmission with medical clearance by Dr. Dillard Cannon and Dr. Haroldine Laws cardiology. Will  admit to inpatient rehab today.  Preadmission Screen Completed By:  Cleatrice Burke, 02/07/2015 1:11 PM ______________________________________________________________________   Discussed status with Dr. Naaman Plummer on 02/07/2015 at  33 and received telephone approval for admission today.  Admission Coordinator:  Cleatrice Burke, time 2081 Date 02/07/2015.

## 2015-02-07 NOTE — CV Procedure (Signed)
Cardiac Cath Procedure Note:  Indication:  HF  Procedures performed:  1) Right heart catheterization  Description of procedure:   The risks and indication of the procedure were explained. Consent was signed and placed on the chart. An appropriate timeout was taken prior to the procedure. The right neck was prepped and draped in the routine sterile fashion and anesthetized with 1% local lidocaine.   A 7 FR venous sheath was placed in the right internal jugular vein using a modified Seldinger technique. A standard Swan-Ganz catheter was used for the procedure.   Complications: None apparent.  Findings:  RA = 3 RV = 50/4/7 PA = 55/14 (34) PCW = 22 Fick cardiac output/index = 4.6/2.4 Thermodilution CO/CI = 4.8/2.5 PVR = 2.5 WU SVR = 1443 FA sat = 96% PA sat = 51%, 56%, 61% Noninvasive Ao pressure = 106/74 (86)    Assessment: 1. Mildly elevated left-sided filling pressures with mild pulmonary venous HTN 2. Normal cardiac output  Plan/Discussion:  Resume low dose spironolactone. Can go to CIR. No  Need for inotropes at this point.   Daniel Bensimhon 9:26 AM

## 2015-02-07 NOTE — Progress Notes (Signed)
Site area: rt neck Site Prior to Removal:  Level 0 Pressure Applied For: 20 minutes Manual: yes   Patient Status During Pull:  stable Post Pull Site:  Level  0 Post Pull Instructions Given:  yes Post Pull Pulses Present: NA Dressing Applied:  Small tegaderm Bedrest begins @  1005 Comments: no complications. Drank 4oz OJ

## 2015-02-07 NOTE — Progress Notes (Signed)
I have insurance approval and bed to admit pt to inpt rehab today.I have notified Dr. Erlinda Hong, RN CM and SW. Pt is in agreement. I will make the arrangements. [phone]

## 2015-02-07 NOTE — Progress Notes (Deleted)
EKG done for "not capture". No new orders from cardiology. Pacer interrogated in ED. Patient was asymptomatic. Patient rested quietly through out the night.

## 2015-02-07 NOTE — H&P (View-Only) (Signed)
Advanced Heart Failure Rounding Note   Subjective:   The HF Team was asked to consult for increased dyspnea and tachycardia by Dr Letta Pate. Prior to admit he had been followed in the HF clinic and was last seen 12/01/2014 and he was stable.   54 y/o male with h/o DM2, polysubstance use (tobacco, cocaine), mixed ICM/NICM and chronic systolic HF due to ventricular noncompaction- on chronic coumadin, noncompliance admitted to [facility] with L sided weakness. Neurology consulted. CT showed R MCA stroke. Did not receive TPA due to delay in arrival. Had ECHO that showed EF 10-15% mild RV HK. No obvious LV clot. As he improved he was transferred to CIR.  This week developed dyspnea and fatigue and transferred back to acute for suspected lowput HF. PICC placed and CVP and co-ox ok but BP too low to titrate HF meds. Ivabradine started.  Yesterday started spiro but BP soft so this was stopped.   This morning was SOB again. + dyspnea on walking hall.     Objective:   Weight Range:  Vital Signs:   Temp:  [98.2 F (36.8 C)-98.6 F (37 C)] 98.3 F (36.8 C) (04/26 0728) Pulse Rate:  [95-102] 95 (04/26 0728) Resp:  [15-28] 18 (04/26 0728) BP: (68-97)/(58-70) 90/65 mmHg (04/26 0728) SpO2:  [93 %-98 %] 93 % (04/26 0728) Weight:  [155 lb 11.2 oz (70.625 kg)-157 lb 4.8 oz (71.351 kg)] 157 lb 4.8 oz (71.351 kg) (04/26 0728) Last BM Date: 02/05/15  Intake/Output:   Intake/Output Summary (Last 24 hours) at 02/06/15 0815 Last data filed at 02/06/15 0728  Gross per 24 hour  Intake    660 ml  Output   1350 ml  Net   -690 ml   Physical Exam:  General: In bed.  No resp difficulty HEENT: normal Neck: supple. JVP 5 Carotids 2+ bilat; no bruits. No lymphadenopathy or thryomegaly appreciated. Cor: PMI laterally displaced. Tachy Regular rate & rhythm. +s3 2/6 MR Lungs: clear Abdomen: soft, nontender, mildly distended. No hepatosplenomegaly. No bruits or masses. Good bowel sounds. Extremities: no cyanosis,  clubbing, rash, edema. LUE weak . RUE PICC Neuro: alert & orientedx3, cranial nerves grossly intact. Affect flat    Labs: Basic Metabolic Panel:  Recent Labs Lab 01/31/15 0809 01/31/15 1055 02/01/15 1236 02/02/15 0956 02/04/15 0530  NA 131* 136 138 135 135  K 5.6* 4.0 3.7 4.3 4.4  CL 99 104 100 99 100  CO2 '24 26 30 28 28  '$ GLUCOSE 309* 173* 62* 331* 277*  BUN '15 14 18 16 18  '$ CREATININE 1.03 0.87 1.08 1.04 0.93  CALCIUM 8.7 8.9 9.1 8.6 8.7    Liver Function Tests: No results for input(s): AST, ALT, ALKPHOS, BILITOT, PROT, ALBUMIN in the last 168 hours. No results for input(s): LIPASE, AMYLASE in the last 168 hours. No results for input(s): AMMONIA in the last 168 hours.  CBC:  Recent Labs Lab 01/30/15 1839 02/01/15 1415 02/05/15 0618  WBC 9.6 8.0 8.9  NEUTROABS 6.2  --   --   HGB 12.0* 12.5* 10.7*  HCT 37.4* 38.4* 33.3*  MCV 94.7 95.0 96.0  PLT 189 204 231    Cardiac Enzymes: No results for input(s): CKTOTAL, CKMB, CKMBINDEX, TROPONINI in the last 168 hours.  BNP: BNP (last 3 results)  Recent Labs  12/03/14 0351 01/24/15 2005  BNP 321.1* 166.1*    ProBNP (last 3 results)  Recent Labs  08/13/14 1850 09/21/14 1928 10/02/14 1033  PROBNP 1525.0* 1870.0* 1085.0*  Other results:  Imaging: No results found.   Medications:     Scheduled Medications: . atorvastatin  20 mg Oral q1800  . digoxin  0.25 mg Oral Daily  . feeding supplement (GLUCERNA SHAKE)  237 mL Oral TID BM  . insulin aspart  0-15 Units Subcutaneous TID WC  . insulin aspart  3 Units Subcutaneous TID WC  . insulin glargine  35 Units Subcutaneous QHS  . ivabradine  5 mg Oral BID WC  . sodium chloride  10-40 mL Intracatheter Q12H  . warfarin  7.5 mg Oral q1800  . Warfarin - Pharmacist Dosing Inpatient   Does not apply q1800    Infusions:    PRN Medications: acetaminophen, sodium chloride   Assessment:  1. CVA, acute with left-sided weakness 2. Dyspnea 3. Acute  on Chronic Systolic Heart Failure- ECHO EF 10-15% with non compaction 4. CAD Coronary angiography (12/2008) with mid LAD totally occluded and collateralized. Continue statin and BB. No ASA he is on coumadin for noncompaction. . 5. DMII  6. Hypotension    Plan/Discussion:    BP remains low and has s3 on exam. BP too low for ACE or b-blocker or spiro.  Continue ivabradine 5 mg twice a day.   INR every Mon-Wed-Fri.    Can go to CIR today. We will follow.  Length of Stay: 5   CLEGG,AMY  NP-C  02/06/2015, 8:15 AM  Advanced Heart Failure Team Pager [phone] (M-F; [place])  Please contact [facility] for night-coverage after hours (4p -7a ) and weekends on [email or web address]  Patient seen and examined with Darrick Grinder, NP. We discussed all aspects of the encounter. I agree with the assessment and plan as stated above.   CVP remains low but still symptomatic. Will plan RHC in am and then plan to return to CIR. Cath procedure discussed with him.   Daniel Bensimhon,MD 2:00 PM

## 2015-02-07 NOTE — Progress Notes (Signed)
At 0712 am  on rounding noted pt was diaphoretic. Wipe pt off with cool wash cloth.   Checked BS  46 MG/dl.  Informed off going nurse who gave pt D50 iv  Recheck BS 120 mg/dl..  Also informed Dr Erlinda Hong this am.  Will continue to monitor.  Karie Kirks, Therapist, sports.

## 2015-02-07 NOTE — Progress Notes (Signed)
Report called to [facility] at 4w.  Verbalized understanding.  Pt made aware that he is being moved transferred. Verbalized understanding.  Karie Kirks, Therapist, sports.

## 2015-02-07 NOTE — Interval H&P Note (Signed)
History and Physical Interval Note:  02/07/2015 9:25 AM  Dennis [name]  has presented today for surgery, with the diagnosis of chf  The various methods of treatment have been discussed with the patient and family. After consideration of risks, benefits and other options for treatment, the patient has consented to  Procedure(s): RIGHT HEART CATH (N/A) as a surgical intervention .  The patient's history has been reviewed, patient examined, no change in status, stable for surgery.  I have reviewed the patient's chart and labs.  Questions were answered to the patient's satisfaction.     Daniel Bensimhon

## 2015-02-07 NOTE — H&P (Signed)
Physical Medicine and Rehabilitation Admission H&P   CC: Left sided weakness, left inattention, poor safety awareness, delayed processing.    HPI: Dennis [name] is a 54 y.o. 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 Blood sugar- 536, INR 1.02 and UDS positive for cocaine. CT head with early signs of R-MCA infarct and CTA head/neck done revealing evolving anterior division right MCA infarct. Dr. Erlinda Hong recommended resuming Coumadin for embolic stroke due to low EF. He has had complaints of SOB and was treated with dose of IV lasix. patient with resultant left hemiparesis, left inattention, fluctuating bouts of lethargy as well as poorinsight and awareness  He was admitted to rehab 01/26/2015 for inpatient therapies to consist of PT, ST and OT at least three hours five days a week. Diabetes was monitored with ac/hs checks and Lantus insulin was titrated to 25 for better BS control. He was encouraged to push po fluids due to nectar liquids. He was maintained on lasix every other day but had complaints of worsening of SOB with fatigue due to CHF. He was transferred back to acute for suspected lowput HF. PICC placed and CVP and co-ox ok but BP too low to titrate HF meds. Ivabradine started and spironolactone added to help with diuresis but this was d/c due to low BP. He developed SOB as well as dyspnea with PT yesterday. Cardiac cath done today revealing mildly elevated left sided pressures with mild pulmonary HTN and normal cardiac output. Dr. Haroldine Laws recommends resuming low dose spironolactone and no need for inotropes at this time. Therapy ongoing and patient continues to be limited by left hemiparesis with LUE inattention, left visual deficits, poor safety awareness with distractibility  requiring redirection to tasks. He is admitted today to resume his rehab program.    Review of Systems  HENT: Negative for hearing loss.  Eyes: Negative for blurred vision and double vision.  Respiratory: Positive for shortness of breath (with activity and at nights). Negative for cough.  Cardiovascular: Negative for chest pain and palpitations.  Gastrointestinal: Negative for heartburn, nausea and abdominal pain.  Genitourinary: Negative for dysuria and urgency.  Musculoskeletal: Negative for myalgias and joint pain.  Neurological: Positive for focal weakness. Negative for headaches.      Past Medical History  Diagnosis Date  . Chronic systolic heart failure     a) Mixed ICM/NICM b) RHC (05/2014): RA 2, RV 19/2/3, PA 22/14 (18), PCWP 6, Fick CO/CI: 5.2 / 2.7, PVR 2.3 WU, PA 60% and 64% c) ECHO (05/2014): EF 20-25%, diff HK, akinesis entireanteroseptal myocardium, triv AI, mod MR, LA mod/sev dilated  . Automatic implantable cardioverter-defibrillator in situ   . Heart murmur   . Sleep apnea     "cleared after T&A"  . Left ventricular noncompaction   . Ischemic cardiomyopathy     a) Coronary angiography (12/2008) at [facility]: Lmain: nl, LAD mid 100% stenosis with left to left and right-to-left collaterals to the distal LAD; Lcx: nl, RCA nl.   . Type II diabetes mellitus   . High cholesterol   . Pneumonia 1988  . Drug abuse and dependence   . CHF (congestive heart failure)     Past Surgical History  Procedure Laterality Date  . Forearm fracture surgery Left 1980  . Cardiac defibrillator placement  03/2011  .  Cholecystectomy  1994  . Right heart catheterization N/A 05/30/2014    Procedure: RIGHT HEART CATH; Surgeon: Jolaine Artist, MD; Location: [facility] CATH LAB; Service: Cardiovascular; Laterality: N/A;  . Fracture surgery    . Cardiac catheterization  05/2014  . Tonsillectomy and adenoidectomy   ~ 1998    Family History  Problem Relation Age of Onset  . Diabetes Mother   . Heart disease Mother   . Diabetes Father   . Prostate cancer Father   . Heart disease Father   . Diabetes Brother   . Diabetes Brother     Social History: Single. Lives with mother. Unemployed. reports that he has been smoking Cigarettes. He has a 3.1 pack-year smoking history. He has never used smokeless tobacco. He reports that he drinks alcohol occasionally. He reports that he last used Cocaine a few weeks ago.    Allergies: No Known Allergies    Medications Prior to Admission  Medication Sig Dispense Refill  . acetaminophen (TYLENOL) 500 MG tablet Take 1,000 mg by mouth every 6 (six) hours as needed for moderate pain.    . carvedilol (COREG) 3.125 MG tablet Take 3.125 mg by mouth 2 (two) times daily with a meal.    . digoxin (LANOXIN) 0.125 MG tablet Take 0.125 mg by mouth daily.    . furosemide (LASIX) 40 MG tablet Take 40 mg by mouth daily.    Marland Kitchen glucose blood (ONETOUCH VERIO) test strip Use as instructed 100 each 12  . insulin aspart (NOVOLOG) 100 UNIT/ML injection Before each meal 3 times a day, 140-199 - 2 units, 200-250 - 4 units, 251-299 - 6 units, 300-349 - 8 units, 350 or above 10 units. Dispense syringes and needles as needed, Ok to switch to PEN if approved. 1 vial 12  . Insulin Glargine (LANTUS) 100 UNIT/ML Solostar Pen Inject 25 Units into the skin daily at 10 pm. (Patient taking differently: Inject 32 Units into the skin daily at 10 pm. ) 15 mL 11  . metFORMIN (GLUCOPHAGE) 500 MG tablet Take 500 mg by mouth 2 (two) times daily with a meal.    . QC PEN NEEDLES 31G X 6 MM MISC 1 each by Other route See admin instructions. Use daily with insulin.    Marland Kitchen warfarin (COUMADIN) 10 MG tablet Take 10 mg by mouth daily.      Home: Home Living Family/patient expects to be discharged to:: Inpatient  rehab Living Arrangements: Parent Available Help at Discharge: Family, Available 24 hours/day, Friend(s) (girlfriend-Pat) Type of Home: House Home Access: Stairs to enter [employer] of Steps: 1 step into back entry Entrance Stairs-Rails: None Home Layout: Multi-level, 1/2 bath on main level, Bed/bath upstairs Alternate Level Stairs-Number of Steps: 12 (first 5 steps rail on R and no wall L; next 7 steps rail on L and wall on R) Alternate Level Stairs-Rails: Right, Left Home Equipment: Tub bench Additional Comments: Pat, pt's longtime girlfriend, will assist as needed.  Functional History: Prior Function Level of Independence: Independent (prior to CVA) Comments: Pt reports he drives and is independent with IADLs   Functional Status:  Mobility: Bed Mobility Overal bed mobility: Needs Assistance Bed Mobility: Supine to Sit, Sit to Supine Supine to sit: Supervision Sit to supine: Supervision Transfers Overall transfer level: Needs assistance Equipment used: None Transfers: Sit to/from Stand Sit to Stand: Min guard General transfer comment: min guard assist due to impulsive at times with need for steadying assist.  Ambulation/Gait Ambulation/Gait assistance: Museum/gallery curator (  Feet): 200 Feet Assistive device: None Gait Pattern/deviations: Step-through pattern, Decreased stride length, Decreased step length - right, Decreased stance time - left, Decreased dorsiflexion - left, Decreased weight shift to left, Drifts right/left (when fatigues, circumducts left LE.) Gait velocity: decreased Gait velocity interpretation: Below normal speed for age/gender General Gait Details: Pt now circumducting left LE to bring through with occaisional drifting due to left hip instability. Buckling noted however pt does appear to self correct. Pt lacks awareness of left UE with it swinging into left LE appearing to throw him off balance at times. Pt went to bathroom and  urinated in toilet with min guard assist. Needed min asssist to pick up item from floor. Continues to need cues to slow down and concentrate on tasks as he is impulsive and does not fully attend to tasks.     ADL: ADL Overall ADL's : Needs assistance/impaired Eating/Feeding: Set up, Sitting Grooming: Wash/dry hands, Min guard, Standing Upper Body Bathing: Min guard, Standing Lower Body Bathing: Min guard, Sit to/from stand Upper Body Dressing : Minimal assistance, Sitting (for orientation primarily) Lower Body Dressing: Min guard, Sit to/from stand Toilet Transfer: Min guard, Ambulation, Regular Toilet Toileting- Clothing Manipulation and Hygiene: Min guard, Sit to/from stand Functional mobility during ADLs: Min guard (only ambulated within room) General ADL Comments: Pt with decreased planning and executive function for ADL and IADL.  Cognition: Cognition Overall Cognitive Status: Impaired/Different from baseline Orientation Level: Oriented X4 Cognition Arousal/Alertness: Awake/alert (falls asleep when not stimulated) Behavior During Therapy: Flat affect Overall Cognitive Status: Impaired/Different from baseline Area of Impairment: Safety/judgement, Awareness, Problem solving, Attention Current Attention Level: Alternating Safety/Judgement: Decreased awareness of deficits, Decreased awareness of safety Awareness: Emergent General Comments: Pt with poor insight into deficits other than that he is weaker on the L side. Unable to state any ramifications of his deficits in ADL or mobility.   Blood pressure 97/70, pulse 89, temperature 97.9 F (36.6 C), temperature source Oral, resp. rate 18, height '6\' 1"'$  (1.854 m), weight 70.988 kg (156 lb 8 oz), SpO2 100 %. Physical Exam  Nursing note and vitals reviewed. Constitutional: He is oriented to person, place, and time. He appears well-developed and well-nourished.  Dry dressing right neck.  HENT:  Head: Normocephalic and  atraumatic.  Eyes: Conjunctivae are normal. Pupils are equal, round, and reactive to light.  Neck: Normal range of motion. Neck supple.  Cardiovascular: Normal rate and regular rhythm.no murmurs or rubs  Respiratory: Effort normal and breath sounds normal. No respiratory distress. He has no wheezes.  GI: Soft. Bowel sounds are normal. He exhibits no distension.  Musculoskeletal: He exhibits no edema or tenderness.  Neurological: He is alert and oriented to person, place, and time.  Mild left facial weakness. Speech clear. Able to follow one and two step commands without difficulty. LUE inattention noted. Motor strength is 5/5 in the Right deltoid biceps triceps grip hip flexor and knee extensor ankle dorsiflexor Left upper extremity 4- to 4/5 in the deltoid, biceps, triceps, grip, hip flexor, knee extensor, ankle dorsiflexor and plantar flexor Sensation intact to light touch in bilateral upper and lower limbs\ mild left pronator drift  Skin: Skin is warm and dry.      Lab Results Last 48 Hours    Results for orders placed or performed during the hospital encounter of 02/01/15 (from the past 48 hour(s))  Glucose, capillary Status: Abnormal   Collection Time: 02/05/15 12:31 PM  Result Value Ref Range   Glucose-Capillary 154 (H) 70 -  99 mg/dL  Glucose, capillary Status: Abnormal   Collection Time: 02/05/15 5:20 PM  Result Value Ref Range   Glucose-Capillary 108 (H) 70 - 99 mg/dL  Glucose, capillary Status: Abnormal   Collection Time: 02/05/15 8:53 PM  Result Value Ref Range   Glucose-Capillary 179 (H) 70 - 99 mg/dL   Comment 1 Notify RN    Comment 2 Document in Chart   Glucose, capillary Status: Abnormal   Collection Time: 02/06/15 6:55 AM  Result Value Ref Range   Glucose-Capillary 134 (H) 70 - 99 mg/dL  Glucose, capillary Status: Abnormal   Collection Time: 02/06/15 11:52 AM  Result Value Ref Range     Glucose-Capillary 116 (H) 70 - 99 mg/dL  Glucose, capillary Status: Abnormal   Collection Time: 02/06/15 5:01 PM  Result Value Ref Range   Glucose-Capillary 174 (H) 70 - 99 mg/dL  Glucose, capillary Status: Abnormal   Collection Time: 02/06/15 9:20 PM  Result Value Ref Range   Glucose-Capillary 208 (H) 70 - 99 mg/dL  Protime-INR Status: Abnormal   Collection Time: 02/07/15 4:40 AM  Result Value Ref Range   Prothrombin Time 27.3 (H) 11.6 - 15.2 seconds   INR 2.52 (H) 0.00 - 1.49  Glucose, capillary Status: Abnormal   Collection Time: 02/07/15 4:54 AM  Result Value Ref Range   Glucose-Capillary 133 (H) 70 - 99 mg/dL  Glucose, capillary Status: Abnormal   Collection Time: 02/07/15 7:16 AM  Result Value Ref Range   Glucose-Capillary 46 (L) 70 - 99 mg/dL  Glucose, capillary Status: Abnormal   Collection Time: 02/07/15 7:40 AM  Result Value Ref Range   Glucose-Capillary 120 (H) 70 - 99 mg/dL  I-STAT 3, venous blood gas (G3P V) Status: Abnormal   Collection Time: 02/07/15 9:16 AM  Result Value Ref Range   pH, Ven 7.322 (H) 7.250 - 7.300   pCO2, Ven 45.8 45.0 - 50.0 mmHg   pO2, Ven 34.0 30.0 - 45.0 mmHg   Bicarbonate 23.7 20.0 - 24.0 mEq/L   TCO2 25 0 - 100 mmol/L   O2 Saturation 61.0 %   Acid-base deficit 2.0 0.0 - 2.0 mmol/L   Sample type VENOUS    Comment NOTIFIED PHYSICIAN   I-STAT 3, venous blood gas (G3P V) Status: Abnormal   Collection Time: 02/07/15 9:17 AM  Result Value Ref Range   pH, Ven 7.310 (H) 7.250 - 7.300   pCO2, Ven 50.9 (H) 45.0 - 50.0 mmHg   pO2, Ven 31.0 30.0 - 45.0 mmHg   Bicarbonate 25.6 (H) 20.0 - 24.0 mEq/L   TCO2 27 0 - 100 mmol/L   O2 Saturation 53.0 %   Acid-base deficit 1.0 0.0 - 2.0 mmol/L   Sample type VENOUS    Comment NOTIFIED PHYSICIAN   I-STAT 3,  venous blood gas (G3P V) Status: Abnormal   Collection Time: 02/07/15 9:21 AM  Result Value Ref Range   pH, Ven 7.315 (H) 7.250 - 7.300   pCO2, Ven 50.0 45.0 - 50.0 mmHg   pO2, Ven 32.0 30.0 - 45.0 mmHg   Bicarbonate 25.5 (H) 20.0 - 24.0 mEq/L   TCO2 27 0 - 100 mmol/L   O2 Saturation 56.0 %   Acid-base deficit 1.0 0.0 - 2.0 mmol/L   Sample type VENOUS    Comment NOTIFIED PHYSICIAN   Glucose, capillary Status: Abnormal   Collection Time: 02/07/15 9:40 AM  Result Value Ref Range   Glucose-Capillary 66 (L) 70 - 99 mg/dL  Glucose, capillary Status: None  Collection Time: 02/07/15 10:11 AM  Result Value Ref Range   Glucose-Capillary 73 70 - 99 mg/dL  Glucose, capillary Status: Abnormal   Collection Time: 02/07/15 11:07 AM  Result Value Ref Range   Glucose-Capillary 121 (H) 70 - 99 mg/dL      Imaging Results (Last 48 hours)    No results found.       Medical Problem List and Plan: 1. Functional deficits secondary to Embolic R-MCA infarct 2. DVT Prophylaxis/Anticoagulation: Pharmaceutical: Coumadin 3. Pain Management: Tylenol prn 4. Mood: LCSW to follow for evaluation and support.  5. Neuropsych: This patient is capable of making decisions on her own behalf. 6. Skin/Wound Care: Routine pressure relief measures.  7. Fluids/Electrolytes/Nutrition: Monitor I/O. Check lytes in am. On thin liquids at this time.  8. ICM/NICM/Chronic Systolic CHF: Low salt diet. Monitor daily weights. Continue spironolactone and digoxin daily. Add TED hose to prevent orthostasis 9. DM type 2: Monitor BS with achs checks. Continue lantus 25 units qhs 10. Dyspnea: Encourage IS as well as activity.       Post Admission Physician Evaluation: 1. Functional deficits secondary to embolic right MCA infarct. 2. Patient is admitted to receive collaborative, interdisciplinary care between the physiatrist,  rehab nursing staff, and therapy team. 3. Patient's level of medical complexity and substantial therapy needs in context of that medical necessity cannot be provided at a lesser intensity of care such as a SNF. 4. Patient has experienced substantial functional loss from his/her baseline which was documented above under the "Functional History" and "Functional Status" headings. Judging by the patient's diagnosis, physical exam, and functional history, the patient has potential for functional progress which will result in measurable gains while on inpatient rehab. These gains will be of substantial and practical use upon discharge in facilitating mobility and self-care at the household level. 5. Physiatrist will provide 24 hour management of medical needs as well as oversight of the therapy plan/treatment and provide guidance as appropriate regarding the interaction of the two. 6. 24 hour rehab nursing will assist with bladder management, bowel management, safety, skin/wound care, disease management, medication administration, pain management and patient education and help integrate therapy concepts, techniques,education, etc. 7. PT will assess and treat for/with: Lower extremity strength, range of motion, stamina, balance, functional mobility, safety, adaptive techniques and equipment, NMR, activity tolerance. Goals are: mod I. 8. OT will assess and treat for/with: ADL's, functional mobility, safety, upper extremity strength, adaptive techniques and equipment, NMR, community reintegration. Goals are: mod I. Therapy may proceed with showering this patient. 9. SLP will assess and treat for/with: speech, cognition, communication. Goals are: mod I. 10. Case Management and Social Worker will assess and treat for psychological issues and discharge planning. 11. Team conference will be held weekly to assess progress toward goals and to determine barriers to discharge. 12. Patient will receive at least 3  hours of therapy per day at least 5 days per week. 13. ELOS: 7-10 days  14. Prognosis: excellent     Meredith Staggers, MD, [place] [facility] 02/07/2015

## 2015-02-07 NOTE — Progress Notes (Signed)
Physical Medicine and Rehabilitation Consult   Reason for Consult: Left sided weakness, left facial droop and slurred speech Referring Physician: Dr. Candiss Norse   HPI: Dennis [name] is a 54 y.o. male with history of chronic systolic HF, OSA, DM type 2, Left ventricular non-compaction on chronic coumadin, s/p ICD, medication non-compliance; who was found down by family with slurred speech, left facial droop and left sided weakness. Blood sugar- 536 and UDS positive for cocaine. CT head with early signs of R-MCA infarct. Dr. Armida Sans consulted and recommended full workup for likely cardio-embolic stroke. Swallow evaluation done revealing mild oropharyngeal dysphagia and patient placed on DIII, nectar liquids. Cognitive evaluation done revealing impulsivity as well as subtle short term memory deficits. CIR recommended by MD and ST for follow up therapy.   Fiance as well as mother at bedside. Review of Systems  HENT: Negative for hearing loss.  Eyes: Negative for blurred vision and double vision.  Respiratory: Positive for shortness of breath. Negative for cough and wheezing.  Cardiovascular: Negative for chest pain and palpitations.  Neurological: Positive for speech change and focal weakness. Negative for headaches.      Past Medical History  Diagnosis Date  . Chronic systolic heart failure     a) Mixed ICM/NICM b) RHC (05/2014): RA 2, RV 19/2/3, PA 22/14 (18), PCWP 6, Fick CO/CI: 5.2 / 2.7, PVR 2.3 WU, PA 60% and 64% c) ECHO (05/2014): EF 20-25%, diff HK, akinesis entireanteroseptal myocardium, triv AI, mod MR, LA mod/sev dilated  . Automatic implantable cardioverter-defibrillator in situ   . Heart murmur   . Sleep apnea     "cleared after T&A"  . Left ventricular noncompaction   . Ischemic cardiomyopathy     a) Coronary angiography (12/2008) at [facility]: Lmain: nl, LAD mid 100% stenosis with left to left and right-to-left collaterals to the distal LAD; Lcx: nl,  RCA nl.   . Type II diabetes mellitus   . High cholesterol   . Pneumonia 1988  . Drug abuse and dependence   . CHF (congestive heart failure)     Past Surgical History  Procedure Laterality Date  . Forearm fracture surgery Left 1980  . Cardiac defibrillator placement  03/2011  . Cholecystectomy  1994  . Right heart catheterization N/A 05/30/2014    Procedure: RIGHT HEART CATH; Surgeon: Jolaine Artist, MD; Location: [facility] CATH LAB; Service: Cardiovascular; Laterality: N/A;  . Fracture surgery    . Cardiac catheterization  05/2014  . Tonsillectomy and adenoidectomy  ~ 1998    Family History  Problem Relation Age of Onset  . Diabetes Mother   . Heart disease Mother   . Diabetes Father   . Prostate cancer Father   . Heart disease Father   . Diabetes Brother   . Diabetes Brother     Social History: Single. Lives with mother. Unemployed. reports that he has been smoking Cigarettes. He has a 3.1 pack-year smoking history. He has never used smokeless tobacco. He reports that he drinks alcohol occasionally. He reports that he last used Cocaine a few weeks ago.    Allergies: No Known Allergies    Medications Prior to Admission  Medication Sig Dispense Refill  . acetaminophen (TYLENOL) 500 MG tablet Take 1,000 mg by mouth every 6 (six) hours as needed for moderate pain.    . carvedilol (COREG) 3.125 MG tablet Take 1 tablet (3.125 mg total) by mouth 2 (two) times daily with a meal. 60 tablet 3  . digoxin (LANOXIN) 0.125 MG  tablet Take 1 tablet (0.125 mg total) by mouth daily. 30 tablet 3  . furosemide (LASIX) 40 MG tablet Take 1 tablet (40 mg total) by mouth every other day. 45 tablet 3  . Insulin Glargine (LANTUS) 100 UNIT/ML Solostar Pen Inject 32 Units into the skin daily at 10 pm. 15 mL 11  . traMADol (ULTRAM) 50 MG tablet Take 1 tablet (50 mg total) by  mouth every 12 (twelve) hours as needed for moderate pain. 14 tablet 0  . warfarin (COUMADIN) 5 MG tablet Take 10 mg by mouth daily. Patient states he takes '10mg'$  every day per patient. Has not taken in two weeks per patient    . atorvastatin (LIPITOR) 40 MG tablet Take 1 tablet (40 mg total) by mouth daily at 6 PM. (Patient not taking: Reported on 01/24/2015) 30 tablet 6  . glucose blood (ONETOUCH VERIO) test strip Use as instructed 100 each 12  . metFORMIN (GLUCOPHAGE) 500 MG tablet Take 1 tablet (500 mg total) by mouth 2 (two) times daily with a meal. (Patient not taking: Reported on 12/03/2014) 180 tablet 3  . QC PEN NEEDLES 31G X 6 MM MISC 1 each by Other route See admin instructions. Use daily with insulin.    Marland Kitchen warfarin (COUMADIN) 5 MG tablet Take as directed by Coumadin clinic (Patient not taking: Reported on 12/03/2014) 50 tablet 0    Home: Home Living Family/patient expects to be discharged to:: Unsure Living Arrangements: Parent Available Help at Discharge: Family, Available 24 hours/day Type of Home: House Home Access: Stairs to enter [employer] of Steps: 3 Entrance Stairs-Rails: Right, Left Home Layout: Multi-level, Able to live on main level with bedroom/bathroom Home Equipment: Tub bench Lives With: Family  Functional History: Prior Function Level of Independence: Independent Functional Status:  Mobility: Bed Mobility Overal bed mobility: Needs Assistance Bed Mobility: Supine to Sit Supine to sit: Min assist, HOB elevated General bed mobility comments: Min assist for truncal balance. HOB elevated. Cues for technique. Performed towards patients Left side. Transfers Overall transfer level: Needs assistance Equipment used: None Transfers: Sit to/from Stand Sit to Stand: Min assist General transfer comment: Min assist for balance from lowest bed setting. Cues for technique. Mild buckling of LLE however able to self correct.    Ambulation/Gait Ambulation/Gait assistance: Min assist Ambulation Distance (Feet): 20 Feet Assistive device: 1 person hand held assist Gait Pattern/deviations: Step-to pattern, Decreased step length - right, Decreased step length - left, Decreased stance time - left, Decreased stride length, Decreased dorsiflexion - left, Decreased weight shift to left, Shuffle Gait velocity: decreased Gait velocity interpretation: Below normal speed for age/gender General Gait Details: Min assist for balance on Pts left side. Mild buckling noted at times of Lt knee however able to self correct. Tends to drag LLE behind him. Cues for hip flexion and Lt ankle clearance. Able to stand to urinate with min guard assist. Picked up an item on the floor with min assist for balance. Somewhat impulsive, needs cues to take his time and concentrate on task at hand due to some lack of awareness of deficits.    ADL:    Cognition: Cognition Overall Cognitive Status: Within Functional Limits for tasks assessed Arousal/Alertness: Awake/alert Orientation Level: Oriented X4 Attention: Selective Selective Attention: Appears intact Memory: Appears intact (during formal testing, ? slight difficulty with basic carryo) Awareness: Appears intact Problem Solving: Appears intact Safety/Judgment: Appears intact Cognition Arousal/Alertness: Lethargic Behavior During Therapy: Impulsive Overall Cognitive Status: Within Functional Limits for tasks assessed  Blood pressure 91/55, pulse 93, temperature 97.5 F (36.4 C), temperature source Oral, resp. rate 16, height 6' (1.829 m), weight 60.011 kg (132 lb 4.8 oz), SpO2 100 %. Physical Exam  Nursing note and vitals reviewed. Constitutional: He is oriented to person, place, and time. He appears well-developed and well-nourished. He appears lethargic. He is easily aroused.  HENT:  Head: Normocephalic and atraumatic.  Eyes: Conjunctivae are normal. Pupils are equal, round, and  reactive to light.  Neck: Normal range of motion. Neck supple.  Cardiovascular: Normal rate and regular rhythm.  Respiratory: Effort normal. No respiratory distress. He has no wheezes.  GI: Soft. Bowel sounds are normal. He exhibits no distension. There is no tenderness.  Neurological: He is oriented to person, place, and time and easily aroused. He appears lethargic.  Had difficulty staying awake but was able to follow basic commands without difficulty. Mild dysarthria noted. Left hemiparesis.  Skin: Skin is warm and dry.  Patient is alert currently. Motor strength is 5/5 in the deltoid biceps triceps grip hip flexor and knee extensor ankle dorsiflexor Left upper extremity 4/5 in the deltoid, biceps, triceps, grip, hip flexor, knee extensor, ankle dorsiflexor and plantar flexor. Sensation intact to light touch in bilateral upper and lower limbs\ Visual fields are intact to confrontation testing he exhibits mild left neglect   Lab Results Last 24 Hours    Results for orders placed or performed during the hospital encounter of 01/24/15 (from the past 24 hour(s))  CBG monitoring, ED Status: Abnormal   Collection Time: 01/24/15 4:05 PM  Result Value Ref Range   Glucose-Capillary 367 (H) 70 - 99 mg/dL  Glucose, capillary Status: Abnormal   Collection Time: 01/24/15 7:56 PM  Result Value Ref Range   Glucose-Capillary 281 (H) 70 - 99 mg/dL   Comment 1 Notify RN   Brain natriuretic peptide Status: Abnormal   Collection Time: 01/24/15 8:05 PM  Result Value Ref Range   B Natriuretic Peptide 166.1 (H) 0.0 - 100.0 pg/mL  Glucose, capillary Status: Abnormal   Collection Time: 01/24/15 11:50 PM  Result Value Ref Range   Glucose-Capillary 198 (H) 70 - 99 mg/dL  Glucose, capillary Status: Abnormal   Collection Time: 01/25/15 4:13 AM  Result Value Ref Range   Glucose-Capillary 62 (L) 70 - 99 mg/dL  Glucose,  capillary Status: Abnormal   Collection Time: 01/25/15 4:35 AM  Result Value Ref Range   Glucose-Capillary 178 (H) 70 - 99 mg/dL   Comment 1 Notify RN   Lipid panel Status: Abnormal   Collection Time: 01/25/15 5:35 AM  Result Value Ref Range   Cholesterol 197 0 - 200 mg/dL   Triglycerides 42 <150 mg/dL   HDL 73 >39 mg/dL   Total CHOL/HDL Ratio 2.7 RATIO   VLDL 8 0 - 40 mg/dL   LDL Cholesterol 116 (H) 0 - 99 mg/dL  Basic metabolic panel Status: Abnormal   Collection Time: 01/25/15 5:35 AM  Result Value Ref Range   Sodium 140 135 - 145 mmol/L   Potassium 4.1 3.5 - 5.1 mmol/L   Chloride 104 96 - 112 mmol/L   CO2 25 19 - 32 mmol/L   Glucose, Bld 169 (H) 70 - 99 mg/dL   BUN 16 6 - 23 mg/dL   Creatinine, Ser 0.88 0.50 - 1.35 mg/dL   Calcium 9.5 8.4 - 10.5 mg/dL   GFR calc non Af Amer >90 >90 mL/min   GFR calc Af Amer >90 >90 mL/min   Anion gap 11  5 - 15  Glucose, capillary Status: Abnormal   Collection Time: 01/25/15 7:47 AM  Result Value Ref Range   Glucose-Capillary 145 (H) 70 - 99 mg/dL  Glucose, capillary Status: Abnormal   Collection Time: 01/25/15 12:22 PM  Result Value Ref Range   Glucose-Capillary 280 (H) 70 - 99 mg/dL      Imaging Results (Last 48 hours)    Ct Angio Head W/cm &/or Wo Cm  01/25/2015 CLINICAL DATA: 54 year old male stroke patient with left side weakness and difficulty swallowing. Not a candidate for thrombolysis. Cannot have MRI. Initial encounter. EXAM: CT ANGIOGRAPHY HEAD AND NECK TECHNIQUE: Multidetector CT imaging of the head and neck was performed using the standard protocol during bolus administration of intravenous contrast. Multiplanar CT image reconstructions and MIPs were obtained to evaluate the vascular anatomy. Carotid stenosis measurements (when applicable) are obtained utilizing NASCET criteria, using  the distal internal carotid diameter as the denominator. CONTRAST: 66m OMNIPAQUE IOHEXOL 350 MG/ML SOLN COMPARISON: Head CT without contrast 01/24/2015. FINDINGS: CT HEAD Brain: Evolved right MCA anterior division infarct with progressive gray and white matter hypodensity corresponding to the insula and anterior operculum, in the area questioned by CT yesterday. No acute intracranial hemorrhage identified. No associated mass effect. No ventriculomegaly. Stable gray-white matter differentiation elsewhere. Calvarium and skull base: Negative. Paranasal sinuses: Visualized paranasal sinuses and mastoids are clear. Orbits: Negative. CTA NECK Other neck: Left chest cardiac pacemaker with mild streak artifact. Paraseptal and centrilobular emphysema. No superior mediastinal lymphadenopathy. Thyroid, larynx, pharynx, parapharyngeal spaces, retropharyngeal space and sublingual space are within normal limits. Diffuse increased attenuation of subcutaneous fat and superficial soft tissues such that there is portal in a shin of the submandibular and parotid glands, as well as the muscle layers. No cervical lymphadenopathy identified. Skeleton: Degenerative changes in the cervical spine. No acute osseous abnormality identified. Aortic arch: 3 vessel arch configuration. Mild distal arch calcified plaque. No great vessel origin stenosis. Right carotid system: Negative right CCA. Widely patent right carotid bifurcation with minimal calcified plaque (series 504, image 99). Negative cervical right ICA. Left carotid system: Negative left CCA. Negative left carotid bifurcation. Negative cervical left ICA. Vertebral arteries: No proximal right subclavian artery stenosis. Paravertebral venous contrast reflux obscures the proximal right vertebral artery and its origin. The right V2 segment is normal. Negative right vertebral artery at the skullbase. No proximal left subclavian artery stenosis. Non dominant appearing  left vertebral artery has a normal origin, and is normal to the skullbase. CTA HEAD Posterior circulation: Mildly dominant distal right vertebral artery. Normal PICA origins. Normal vertebrobasilar junction. No basilar artery stenosis. Normal SCA and left PCA origins. Fetal type right PCA origin. Left posterior communicating artery is diminutive or absent. Bilateral PCA branches are within normal limits. Anterior circulation: Negative left ICA siphon. Normal left ophthalmic artery origin. Negative right ICA siphon. Normal right ophthalmic and posterior communicating artery origins. Normal carotid termini. Normal MCA and ACA origins. Diminutive or absent anterior communicating artery. Bilateral ACA branches are within normal limits. Left MCA branches are within normal limits. Right MCA origin is normal and M1 segment is patent. Right MCA bifurcation is patent. There is then filling defect and irregularity of proximal right MCA M2 branches, with subtotal loss of enhancement of the proximal anterior sylvian division best seen on series 506, image 16. Despite this, there is preserved more distal right M2 and M3 enhancement. No major right MCA branch occlusion is identified. Venous sinuses: Negative. Anatomic variants: Mildly dominant right vertebral artery. Fetal  type right PCA origin. Delayed phase: No abnormal enhancement identified. IMPRESSION: 1. Evolving anterior division right MCA infarct. No hemorrhage or mass effect. 2. Associated filling defect just beyond the right MCA bifurcation affecting the proximal right M2 branches compatible with thromboembolic disease. Despite this, no major right MCA branch occlusion is identified. 3. Otherwise negative intracranial CTA. 4. Minimal atherosclerosis in the neck and aortic arch. 5. Emphysema. Electronically Signed By: Genevie Ann M.D. On: 01/25/2015 14:31   Ct Head Wo Contrast  01/24/2015 CLINICAL DATA: Awoke with left-sided weakness and difficulty  swallowing. EXAM: CT HEAD WITHOUT CONTRAST TECHNIQUE: Contiguous axial images were obtained from the base of the skull through the vertex without intravenous contrast. COMPARISON: None. FINDINGS: The brainstem and cerebellum are unremarkable. I think there is debatable loss of gray-white differentiation in the insular region on the right. There may be early change in the posterior frontal region. Basal ganglia appear intact. No sign of hemorrhage, hydrocephalus or extra-axial collection. No calvarial abnormality. Visualized sinuses are clear. IMPRESSION: Question loss of gray-white differentiation in the insula and posterior frontal region on the right that could go along with the earliest CT manifestation of acute infarction in the right middle cerebral artery territory. No hemorrhage. Electronically Signed By: Nelson Chimes M.D. On: 01/24/2015 16:08   Ct Angio Neck W/cm &/or Wo/cm  01/25/2015 CLINICAL DATA: 54 year old male stroke patient with left side weakness and difficulty swallowing. Not a candidate for thrombolysis. Cannot have MRI. Initial encounter. EXAM: CT ANGIOGRAPHY HEAD AND NECK TECHNIQUE: Multidetector CT imaging of the head and neck was performed using the standard protocol during bolus administration of intravenous contrast. Multiplanar CT image reconstructions and MIPs were obtained to evaluate the vascular anatomy. Carotid stenosis measurements (when applicable) are obtained utilizing NASCET criteria, using the distal internal carotid diameter as the denominator. CONTRAST: 65m OMNIPAQUE IOHEXOL 350 MG/ML SOLN COMPARISON: Head CT without contrast 01/24/2015. FINDINGS: CT HEAD Brain: Evolved right MCA anterior division infarct with progressive gray and white matter hypodensity corresponding to the insula and anterior operculum, in the area questioned by CT yesterday. No acute intracranial hemorrhage identified. No associated mass effect. No ventriculomegaly. Stable  gray-white matter differentiation elsewhere. Calvarium and skull base: Negative. Paranasal sinuses: Visualized paranasal sinuses and mastoids are clear. Orbits: Negative. CTA NECK Other neck: Left chest cardiac pacemaker with mild streak artifact. Paraseptal and centrilobular emphysema. No superior mediastinal lymphadenopathy. Thyroid, larynx, pharynx, parapharyngeal spaces, retropharyngeal space and sublingual space are within normal limits. Diffuse increased attenuation of subcutaneous fat and superficial soft tissues such that there is portal in a shin of the submandibular and parotid glands, as well as the muscle layers. No cervical lymphadenopathy identified. Skeleton: Degenerative changes in the cervical spine. No acute osseous abnormality identified. Aortic arch: 3 vessel arch configuration. Mild distal arch calcified plaque. No great vessel origin stenosis. Right carotid system: Negative right CCA. Widely patent right carotid bifurcation with minimal calcified plaque (series 504, image 99). Negative cervical right ICA. Left carotid system: Negative left CCA. Negative left carotid bifurcation. Negative cervical left ICA. Vertebral arteries: No proximal right subclavian artery stenosis. Paravertebral venous contrast reflux obscures the proximal right vertebral artery and its origin. The right V2 segment is normal. Negative right vertebral artery at the skullbase. No proximal left subclavian artery stenosis. Non dominant appearing left vertebral artery has a normal origin, and is normal to the skullbase. CTA HEAD Posterior circulation: Mildly dominant distal right vertebral artery. Normal PICA origins. Normal vertebrobasilar junction. No basilar artery stenosis. Normal SCA and  left PCA origins. Fetal type right PCA origin. Left posterior communicating artery is diminutive or absent. Bilateral PCA branches are within normal limits. Anterior circulation: Negative left ICA siphon. Normal left  ophthalmic artery origin. Negative right ICA siphon. Normal right ophthalmic and posterior communicating artery origins. Normal carotid termini. Normal MCA and ACA origins. Diminutive or absent anterior communicating artery. Bilateral ACA branches are within normal limits. Left MCA branches are within normal limits. Right MCA origin is normal and M1 segment is patent. Right MCA bifurcation is patent. There is then filling defect and irregularity of proximal right MCA M2 branches, with subtotal loss of enhancement of the proximal anterior sylvian division best seen on series 506, image 16. Despite this, there is preserved more distal right M2 and M3 enhancement. No major right MCA branch occlusion is identified. Venous sinuses: Negative. Anatomic variants: Mildly dominant right vertebral artery. Fetal type right PCA origin. Delayed phase: No abnormal enhancement identified. IMPRESSION: 1. Evolving anterior division right MCA infarct. No hemorrhage or mass effect. 2. Associated filling defect just beyond the right MCA bifurcation affecting the proximal right M2 branches compatible with thromboembolic disease. Despite this, no major right MCA branch occlusion is identified. 3. Otherwise negative intracranial CTA. 4. Minimal atherosclerosis in the neck and aortic arch. 5. Emphysema. Electronically Signed By: Genevie Ann M.D. On: 01/25/2015 14:31     Assessment/Plan: Diagnosis: Right ACA infarct probably cardioembolic with left hemiparesis and left neglect as well as cognitive deficits 1. Does the need for close, 24 hr/day medical supervision in concert with the patient's rehab needs make it unreasonable for this patient to be served in a less intensive setting? Yes 2. Co-Morbidities requiring supervision/potential complications: Cocaine abuse, hypertension, type 2 diabetes, dysphagia 3. Due to bladder management, bowel management, safety, skin/wound care, disease management, medication administration,  pain management and patient education, does the patient require 24 hr/day rehab nursing? Yes 4. Does the patient require coordinated care of a physician, rehab nurse, PT (1-2 hrs/day, 5 days/week), OT (1-2 hrs/day, 5 days/week) and SLP (0.5-1 hrs/day, 55 days/week) to address physical and functional deficits in the context of the above medical diagnosis(es)? Yes Addressing deficits in the following areas: balance, endurance, locomotion, strength, transferring, bowel/bladder control, bathing, dressing, feeding, grooming, toileting, cognition, speech, language, swallowing and psychosocial support 5. Can the patient actively participate in an intensive therapy program of at least 3 hrs of therapy per day at least 5 days per week? Yes 6. The potential for patient to make measurable gains while on inpatient rehab is excellent 7. Anticipated functional outcomes upon discharge from inpatient rehab are supervision with PT, supervision with OT, supervision with SLP. 8. Estimated rehab length of stay to reach the above functional goals is: 10-14 days 9. Does the patient have adequate social supports and living environment to accommodate these discharge functional goals? Yes 10. Anticipated D/C setting: Home 11. Anticipated post D/C treatments: [place] therapy 12. Overall Rehab/Functional Prognosis: good  RECOMMENDATIONS: This patient's condition is appropriate for continued rehabilitative care in the following setting: CIR Patient has agreed to participate in recommended program. Yes Note that insurance prior authorization may be required for reimbursement for recommended care.  Comment:     01/25/2015       Revision History     Date/Time User Provider Type Action   01/25/2015 4:07 PM Charlett Blake, MD Physician Sign   01/25/2015 2:49 PM Bary Leriche, PA-C Physician Assistant Share   01/25/2015 2:48 PM Bary Leriche, PA-C Physician Assistant  Share   View Details Report       Routing  History     Date/Time From To Method   01/25/2015 4:07 PM Charlett Blake, MD Charlett Blake, MD In Basket   01/25/2015 4:07 PM Charlett Blake, MD [place], FNP In Basket

## 2015-02-08 ENCOUNTER — Inpatient Hospital Stay (HOSPITAL_COMMUNITY): Payer: Medicare Other

## 2015-02-08 ENCOUNTER — Inpatient Hospital Stay (HOSPITAL_COMMUNITY): Payer: Medicare Other | Admitting: Occupational Therapy

## 2015-02-08 ENCOUNTER — Inpatient Hospital Stay (HOSPITAL_COMMUNITY): Payer: Medicare Other | Admitting: Speech Pathology

## 2015-02-08 DIAGNOSIS — I639 Cerebral infarction, unspecified: Secondary | ICD-10-CM

## 2015-02-08 LAB — CBC
HCT: 31.5 % — ABNORMAL LOW (ref 39.0–52.0)
Hemoglobin: 10.3 g/dL — ABNORMAL LOW (ref 13.0–17.0)
MCH: 31.6 pg (ref 26.0–34.0)
MCHC: 32.7 g/dL (ref 30.0–36.0)
MCV: 96.6 fL (ref 78.0–100.0)
Platelets: 236 10*3/uL (ref 150–400)
RBC: 3.26 MIL/uL — ABNORMAL LOW (ref 4.22–5.81)
RDW: 13.9 % (ref 11.5–15.5)
WBC: 10.3 10*3/uL (ref 4.0–10.5)

## 2015-02-08 LAB — COMPREHENSIVE METABOLIC PANEL
ALT: 28 U/L (ref 0–53)
AST: 25 U/L (ref 0–37)
Albumin: 2.8 g/dL — ABNORMAL LOW (ref 3.5–5.2)
Alkaline Phosphatase: 86 U/L (ref 39–117)
Anion gap: 7 (ref 5–15)
BUN: 14 mg/dL (ref 6–23)
CO2: 27 mmol/L (ref 19–32)
Calcium: 8.8 mg/dL (ref 8.4–10.5)
Chloride: 102 mmol/L (ref 96–112)
Creatinine, Ser: 0.91 mg/dL (ref 0.50–1.35)
GFR calc Af Amer: >90 mL/min (ref >90)
GFR calc non Af Amer: >90 mL/min (ref >90)
Glucose, Bld: 204 mg/dL — ABNORMAL HIGH (ref 70–99)
Potassium: 4.4 mmol/L (ref 3.5–5.1)
Sodium: 136 mmol/L (ref 135–145)
Total Bilirubin: 1.1 mg/dL (ref 0.3–1.2)
Total Protein: 5.5 g/dL — ABNORMAL LOW (ref 6.0–8.3)

## 2015-02-08 LAB — GLUCOSE, CAPILLARY
Glucose-Capillary: 193 mg/dL — ABNORMAL HIGH (ref 70–99)
Glucose-Capillary: 155 mg/dL — ABNORMAL HIGH (ref 70–99)
Glucose-Capillary: 69 mg/dL — ABNORMAL LOW (ref 70–99)
Glucose-Capillary: 74 mg/dL (ref 70–99)
Glucose-Capillary: 99 mg/dL (ref 70–99)

## 2015-02-08 LAB — DIGOXIN LEVEL: Digoxin Level: <0.2 ng/mL — ABNORMAL LOW (ref 0.8–2.0)

## 2015-02-08 MED ORDER — WHITE PETROLATUM GEL
Status: AC
  Administered 2015-02-08: 0.2
  Filled 2015-02-08: qty 1

## 2015-02-08 MED ORDER — GLUCERNA SHAKE PO LIQD
237.0000 mL | Freq: Two times a day (BID) | ORAL | Status: DC
  Administered 2015-02-09 – 2015-02-14 (×11): 237 mL via ORAL

## 2015-02-08 NOTE — Care Management Note (Signed)
[facility]patient [facility] Individual Statement of Services  Patient Name:  Dennis [name]  Date:  02/08/2015  Welcome to the [facility].  Our goal is to provide you with an individualized program based on your diagnosis and situation, designed to meet your specific needs.  With this comprehensive rehabilitation program, you will be expected to participate in at least 3 hours of rehabilitation therapies Monday-Friday, with modified therapy programming on the weekends.  Your rehabilitation program will include the following services:  Physical Therapy (PT), Occupational Therapy (OT), Speech Therapy (ST), 24 hour per day rehabilitation nursing, Neuropsychology, Case Management (Social Worker), Rehabilitation Medicine, Nutrition Services and Pharmacy Services  Weekly team conferences will be held on Wednesday to discuss your progress.  Your Social Worker will talk with you frequently to get your input and to update you on team discussions.  Team conferences with you and your family in attendance may also be held.  Expected length of stay: 5-7 days Overall anticipated outcome: independent-supervision level  Depending on your progress and recovery, your program may change. Your Social Worker will coordinate services and will keep you informed of any changes. Your Social Worker's name and contact numbers are listed  below.  The following services may also be recommended but are not provided by the [facility] will be made to provide these services after discharge if needed.  Arrangements include referral to agencies that provide these services.  Your insurance has been verified to be:  [employer]medicare Your primary doctor is:  Gayland Curry  Pertinent information will be shared with your doctor and your insurance company.  Social Worker:   Ovidio Kin, [place] or ([phone]  Information discussed with and copy given to patient by: Elease Hashimoto, 02/08/2015, 12:46 PM

## 2015-02-08 NOTE — Progress Notes (Signed)
54 y.o. 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 Blood sugar- 536, INR 1.02 and UDS positive for cocaine. CT head with early signs of R-MCA infarct and CTA head/neck done revealing evolving anterior division right MCA infarct. Dr. Erlinda Hong recommended resuming Coumadin for embolic stroke due to low EF. He has had complaints of SOB and was treated with dose of IV lasix. patient with resultant left hemiparesis, left inattention, fluctuating bouts of lethargy as well as poorinsight and awareness  Subjective/Complaints:   Objective: Vital Signs: Blood pressure 105/64, pulse 105, temperature 98.5 F (36.9 C), temperature source Oral, resp. rate 17, height 6' (1.829 m), weight 75.07 kg (165 lb 8 oz), SpO2 96 %. No results found. Results for orders placed or performed during the hospital encounter of 02/07/15 (from the past 72 hour(s))  Glucose, capillary     Status: Abnormal   Collection Time: 02/07/15  9:24 PM  Result Value Ref Range   Glucose-Capillary 126 (H) 70 - 99 mg/dL  Comprehensive metabolic panel     Status: Abnormal   Collection Time: 02/08/15  5:12 AM  Result Value Ref Range   Sodium 136 135 - 145 mmol/L   Potassium 4.4 3.5 - 5.1 mmol/L   Chloride 102 96 - 112 mmol/L   CO2 27 19 - 32 mmol/L   Glucose, Bld 204 (H) 70 - 99 mg/dL   BUN 14 6 - 23 mg/dL   Creatinine, Ser 0.91 0.50 - 1.35 mg/dL   Calcium 8.8 8.4 - 10.5 mg/dL   Total Protein 5.5 (L) 6.0 - 8.3 g/dL   Albumin 2.8 (L) 3.5 - 5.2 g/dL   AST 25 0 - 37 U/L   ALT 28 0 - 53 U/L   Alkaline Phosphatase 86 39 - 117 U/L   Total Bilirubin 1.1 0.3 - 1.2 mg/dL   GFR calc non Af Amer >90 >90 mL/min   GFR calc Af Amer >90 >90 mL/min    Comment: (NOTE) The eGFR has been calculated using the CKD EPI equation. This  calculation has not been validated in all clinical situations. eGFR's persistently <90 mL/min signify possible Chronic Kidney Disease.    Anion gap 7 5 - 15  CBC     Status: Abnormal   Collection Time: 02/08/15  5:12 AM  Result Value Ref Range   WBC 10.3 4.0 - 10.5 K/uL   RBC 3.26 (L) 4.22 - 5.81 MIL/uL   Hemoglobin 10.3 (L) 13.0 - 17.0 g/dL   HCT 31.5 (L) 39.0 - 52.0 %   MCV 96.6 78.0 - 100.0 fL   MCH 31.6 26.0 - 34.0 pg   MCHC 32.7 30.0 - 36.0 g/dL   RDW 13.9 11.5 - 15.5 %   Platelets 236 150 - 400 K/uL  Digoxin level     Status: Abnormal   Collection Time: 02/08/15  5:12 AM  Result Value Ref Range   Digoxin Level <0.2 (L) 0.8 - 2.0 ng/mL    Comment: RESULTS CONFIRMED BY MANUAL DILUTION  Glucose, capillary     Status: Abnormal   Collection Time: 02/08/15  6:41 AM  Result Value Ref Range   Glucose-Capillary 193 (H) 70 - 99 mg/dL     HEENT: normal Cardio: RRR Resp: CTA B/L GI: BS positive and NT,ND Extremity:  No Edema Skin:   Intact Neuro:  Alert/Oriented and Abnormal Motor 3- LUE and LLE Musc/Skel:  Normal and Other No pain with UE or LE ROM Gen NAD   Assessment/Plan: 1. Functional deficits secondary to Right MCA infarct with Left hemiparesis which require 3+ hours per day of interdisciplinary therapy in a comprehensive inpatient rehab setting. Physiatrist is providing close team supervision and 24 hour management of active medical problems listed below. Physiatrist and rehab team continue to assess barriers to discharge/monitor patient progress toward functional and medical goals. FIM:                                   Medical Problem List and Plan: 1. Functional deficits secondary to Embolic R-MCA infarct 2. DVT Prophylaxis/Anticoagulation: Pharmaceutical: Coumadin 3. Pain Management: Tylenol prn 4. Mood: LCSW to follow for evaluation and support.  5. Neuropsych: This patient is capable of making decisions on her own behalf. 6. Skin/Wound  Care: Routine pressure relief measures.  7. Fluids/Electrolytes/Nutrition: Monitor I/O. Check lytes in am. On thin liquids at this time.  8. ICM/NICM/Chronic Systolic CHF: Low salt diet. Monitor daily weights. Continue spironolactone and digoxin daily. Add TED hose to prevent orthostasis 9. DM type 2: Monitor BS with achs checks. Continue lantus 25 units qhs 10. Dyspnea: Encourage IS as well as activity.  LOS (Days) 1 A FACE TO FACE EVALUATION WAS PERFORMED  KIRSTEINS,ANDREW E 02/08/2015, 9:18 AM

## 2015-02-08 NOTE — Progress Notes (Addendum)
Initial Nutrition Assessment  DOCUMENTATION CODES:  Non-severe (moderate) malnutrition in context of chronic illness  INTERVENTION:  Glucerna shake BID  NUTRITION DIAGNOSIS:  Malnutrition related to chronic illness as evidenced by moderate depletion of body fat, mild depletion of muscle mass.   GOAL:  Patient will meet greater than or equal to 90% of their needs   MONITOR:  PO intake, Weight trends, Labs  REASON FOR ASSESSMENT:  Malnutrition Screening Tool    ASSESSMENT: Pt is a 54 y.o. 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 Blood sugar- 536, INR 1.02 and UDS positive for cocaine. CT head with early signs of R-MCA infarct and CTA head/neck done revealing evolving anterior division right MCA infarct.   Pt just came back from therapy at time of visit. He states he is feeling good. Per pt his usual body weight is 155 - 160 Lb and that he lost weight recently, however weight history does not support that. Per pt, he has been eating well before admission and continues to eat well during the time of hospitalization (admited since 4/13 to [facility]) and now in rehab (admited 4/27).   Height:  Ht Readings from Last 1 Encounters:  02/08/15 6' (1.829 m)    Weight:  Wt Readings from Last 1 Encounters:  02/08/15 165 lb 8 oz (75.07 kg)    Ideal Body Weight:  80.9 kg  Wt Readings from Last 10 Encounters:  02/08/15 165 lb 8 oz (75.07 kg)  02/07/15 156 lb 8 oz (70.988 kg)  02/01/15 149 lb 11.1 oz (67.9 kg)  01/26/15 148 lb 3.2 oz (67.223 kg)  12/01/14 157 lb 6.4 oz (71.396 kg)  11/30/14 160 lb (72.576 kg)  11/28/14 155 lb 9.6 oz (70.58 kg)  09/25/14 152 lb 5.4 oz (69.1 kg)  09/21/14 162 lb 6 oz (73.653 kg)  06/01/14 153 lb 6.4 oz (69.582 kg)    BMI:  Body mass  index is 22.44 kg/(m^2).  Estimated Nutritional Needs:  Kcal:   1900 - 2100  Protein:   95 - 110 g  Fluid:   2.0 L  Skin:   WDL  Diet Order:  Diet heart healthy/carb modified Room service appropriate?: Yes; Fluid consistency:: Thin  EDUCATION NEEDS:  No education needs identified at this time   Intake/Output Summary (Last 24 hours) at 02/08/15 1301 Last data filed at 02/08/15 1100  Gross per 24 hour  Intake    720 ml  Output    350 ml  Net    370 ml    Last BM:  4/28  Rasma A. [facility] Dietetic Intern Pager: [phone] 02/08/2015 1:01 PM

## 2015-02-08 NOTE — Evaluation (Signed)
Physical Therapy Assessment and Plan  Patient Details  Name: Dennis [name] MRN: [id number] Date of Birth: 10-05-1961  PT Diagnosis: Difficulty walking and Hemiparesis non-dominant Rehab Potential: Good ELOS: 5 to 7 days   Today's Date: 02/08/2015 PT Individual Time: 0900-1025 PT Individual Time Calculation (min): 85 min   Patient missed 5 minutes due to drop in Bp. RN notified.  Problem List:  Patient Active Problem List   Diagnosis Date Noted  . Acute CVA (cerebrovascular accident) 02/07/2015  . HLD (hyperlipidemia)   . Status post PICC central line placement   . History of ischemic right MCA stroke   . Type 2 diabetes mellitus with complication   . Acute on chronic systolic congestive heart failure   . Stroke   . Stroke with cerebral ischemia   . CVA (cerebral infarction) 01/26/2015  . Left-sided weakness   . Embolic stroke involving right middle cerebral artery   . Cardiac LV ejection fraction 10-20%   . LV non-compaction cardiomyopathy   . Acute systolic heart failure 50/06/3817  . Acute ischemic stroke 01/24/2015  . Cocaine abuse 01/24/2015  . H/O noncompliance with medical treatment, presenting hazards to health 01/24/2015  . Tobacco abuse 01/24/2015  . Essential hypertension 01/24/2015  . Hypotension 01/24/2015  . Hyperlipidemia 01/24/2015  . Dysphagia following cerebral infarction 01/24/2015  . Hemiplegia affecting left nondominant side 01/24/2015  . Diabetes mellitus type 2, uncontrolled, with complications 29/93/7169  . Hyperlipidemia associated with type 2 diabetes mellitus 11/30/2014  . CAD (coronary artery disease) 11/16/2014  . Acute on chronic systolic CHF (congestive heart failure) 09/21/2014  . Chest pain on breathing 09/21/2014  . Chronic systolic heart failure 67/89/3810  . Acute on chronic systolic heart failure   . Automatic implantable cardioverter-defibrillator in situ   . Left ventricular noncompaction 05/27/2014  . Dyspnea 05/26/2014  . Chest  discomfort 08/09/2013  . Long term (current) use of anticoagulants 08/09/2013  . Drug abuse and dependence   . CHF (congestive heart failure)   . BP (high blood pressure) 03/04/2013  . Cardiomyopathy, ischemic 07/21/2011  . Type II diabetes mellitus with ophthalmic manifestations 09/13/2007  . TOBACCO ABUSE 09/13/2007  . WEIGHT LOSS, ABNORMAL 09/13/2007  . SOB 09/13/2007    Past Medical History:  Past Medical History  Diagnosis Date  . Chronic systolic heart failure     a) Mixed ICM/NICM b) RHC (05/2014): RA 2, RV 19/2/3, PA 22/14 (18), PCWP 6, Fick CO/CI: 5.2 / 2.7, PVR 2.3 WU, PA 60% and 64% c) ECHO (05/2014): EF 20-25%, diff HK, akinesis entireanteroseptal myocardium, triv AI, mod MR, LA mod/sev dilated  . Automatic implantable cardioverter-defibrillator in situ   . Heart murmur   . Sleep apnea     "cleared after T&A"  . Left ventricular noncompaction   . Ischemic cardiomyopathy     a) Coronary angiography (12/2008) at [facility]: Lmain: nl, LAD mid 100% stenosis with left to left and right-to-left collaterals to the distal LAD; Lcx: nl, RCA nl.    . Type II diabetes mellitus   . High cholesterol   . Pneumonia 1988  . Drug abuse and dependence   . CHF (congestive heart failure)    Past Surgical History:  Past Surgical History  Procedure Laterality Date  . Forearm fracture surgery Left 1980  . Cardiac defibrillator placement  03/2011  . Cholecystectomy  1994  . Right heart catheterization N/A 05/30/2014    Procedure: RIGHT HEART CATH;  Surgeon: Jolaine Artist, MD;  Location: [facility] CATH LAB;  Service: Cardiovascular;  Laterality: N/A;  . Fracture surgery    . Cardiac catheterization  05/2014  . Tonsillectomy and adenoidectomy  ~ 1998  . Right heart catheterization N/A 02/07/2015    Procedure: RIGHT HEART CATH;  Surgeon: Jolaine Artist, MD;  Location: [facility] CATH LAB;  Service: Cardiovascular;  Laterality: N/A;    Assessment & Plan Clinical Impression: Dennis [name] is a 54 y.o.  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 UDS positive for cocaine. CT head with early signs of R-MCA infarct and CTA head/neck done revealing evolving anterior division right MCA infarct.  Pt on CIR 01/26/2015 - 02/01/2015 and was transferred back to acute for suspected lowput HF. PICC placed. Cardiac cath done 4/27 revealing mildly elevated left sided pressures with mild pulmonary HTN and normal cardiac output. Therapy ongoing and patient continues to be limited by left hemiparesis with LUE inattention, left visual deficits, poor safety awareness with distractibility requiring redirection to tasks. He is admitted 02/07/2015 to resume his rehab program.   Patient currently requires min with mobility secondary to muscle weakness, decreased cardiorespiratoy endurance and decreased standing balance, hemiplegia and decreased balance strategies.  Prior to hospitalization, patient was independent  with mobility and lived with Family in a House home.  Home access is 1 step into back entryStairs to enter.  Patient will benefit from skilled PT intervention to maximize safe functional mobility, minimize fall risk and decrease caregiver burden for planned discharge home with intermittent assist.  Anticipate patient will benefit from follow up OP at discharge.  PT - End of Session Activity Tolerance: Tolerates 30+ min activity with multiple rests Endurance Deficit: Yes Endurance Deficit Description: cardiorespiratory PT Assessment Rehab Potential (ACUTE/IP ONLY): Good PT Patient demonstrates impairments in the following area(s): Balance;Endurance;Motor;Safety PT Transfers Functional Problem(s): Bed to Chair;Car;Floor PT Locomotion Functional Problem(s): Ambulation;Stairs PT Plan PT Intensity: Minimum  of 1-2 x/day ,45 to 90 minutes PT Frequency: 5 out of 7 days PT Duration Estimated Length of Stay: 5 to 7 days PT Treatment/Interventions: Ambulation/gait training;Balance/vestibular training;Community reintegration;Discharge planning;Functional mobility training;Patient/family education;Stair training;Therapeutic Activities;UE/LE Strength taining/ROM;Therapeutic Exercise;UE/LE Coordination activities PT Transfers Anticipated Outcome(s): Mod I PT Locomotion Anticipated Outcome(s): Mod I household; supervision community PT Recommendation Follow Up Recommendations: Outpatient PT Patient destination: Home Equipment Recommended: To be determined  Skilled Therapeutic Intervention Patient sitting at edge of bed finishing breakfast upon entering room. Patient participated in initial evaluation. Patient ambulated without assistive device with min steady assist 150 feet. Pt tends to drag left LE during gait even though sensation and strength are fair to good on the left. Patient ambulated up and down 12 steps with bilateral rails and min assist. Patient performed car transfer with min assist/cueing. Patient performed bed mobility independently on regular double bed. Patient participated in [place] and DGI assessments (see scores below). Patient worked on endurance and coordination of left LE using Nustep for 1 minute x 3 on level 3. Vitals were taken throughout session with Bp ranging between 99/69, 93/72, 91/66 and at end of session 85/62. RN notified of drop in Bp. O2 sats remained 98 or greater. HR ranged between 102 and 108. Patient left supine in bed with RN present.  PT Evaluation Precautions/Restrictions Precautions Precautions: Fall Restrictions Weight Bearing Restrictions: No General Chart Reviewed: Yes PT Amount of Missed Time (min): 5 Minutes  PT Missed Treatment Reason: Other (Comment) (drop in Bp) Family/Caregiver Present: No Vital SignsTherapy Vitals Temp: (!) 96.6 F (35.9 C) Temp  Source: Axillary (pt drinking glucerna shake unable to read ) Pulse Rate: 92 Resp: 18 BP: (!) 89/64 mmHg Patient Position (if appropriate): Lying Oxygen Therapy SpO2: 94 % O2 Device: Not Delivered Pain Pain Assessment Pain Assessment: No/denies pain Home Living/Prior Functioning Home Living Available Help at Discharge: Family;Available 24 hours/day Type of Home: House Home Access: Stairs to enter [employer] of Steps: 1 step into back entry Entrance Stairs-Rails: None Home Layout: Multi-level;1/2 bath on main level;Bed/bath upstairs Alternate Level Stairs-Number of Steps: 12 (first 5 steps rail on R and no wall L; next 7 steps rail on L and wall on R) Alternate Level Stairs-Rails: Right;Left  Lives With: Family Prior Function Level of Independence: Independent with gait;Independent with transfers  Able to Take Stairs?: Yes Driving: Yes Vocation: On disability Comments: Pt reports he drives and is independent with IADLs   Cognition Overall Cognitive Status: Within Functional Limits for tasks assessed Arousal/Alertness: Awake/alert Orientation Level: Oriented X4 Attention: Focused;Sustained Focused Attention: Appears intact Sustained Attention: Appears intact Awareness: Appears intact Sensation Sensation Light Touch: Appears Intact Stereognosis: Appears Intact Hot/Cold: Not tested Proprioception: Appears Intact Coordination Gross Motor Movements are Fluid and Coordinated: Yes Fine Motor Movements are Fluid and Coordinated: No Coordination and Movement Description: dysmetria near end point with L finger to nose with left UE Heel Shin Test: slower on L side with less control on descent compared to R side Motor  Motor Motor: Hemiplegia  Mobility Bed Mobility Bed Mobility: Rolling Right;Rolling Left;Supine to Sit Rolling Right: 7: Independent Rolling Left: 7: Independent Supine to Sit: 7: Independent Transfers Transfers: Yes Sit to Stand: 4: Min  guard Stand to Sit: 4: Min guard Stand Pivot Transfers: 4: Min guard Locomotion  Ambulation Ambulation: Yes Ambulation/Gait Assistance: 4: Min guard Ambulation Distance (Feet): 200 Feet Assistive device:  (no device) Gait Gait: Yes Gait Pattern: Lateral trunk lean to left;Left circumduction;Step-through pattern;Decreased dorsiflexion - left;Left foot flat High Level Ambulation High Level Ambulation: Side stepping;Backwards walking;Direction changes;Sudden stops;Head turns Side Stepping: close supervision Backwards Walking: close supervision Direction Changes: occasional steady assist Sudden Stops: occasional LOB that pt regained independently Head Turns: close supervision Stairs / Additional Locomotion Stairs: Yes Stairs Assistance: 4: Min assist Stair Management Technique: Two rails Number of Stairs: 12  Trunk/Postural Assessment  Cervical Assessment Cervical Assessment: Within Functional Limits Thoracic Assessment Thoracic Assessment: Within Functional Limits Lumbar Assessment Lumbar Assessment: Within Functional Limits Postural Control Postural Control: Deficits on evaluation Postural Limitations: pt sits with posterior pelvic tilt, flexed trunk and head. He states that it is easier to breathe this way.  Balance Balance Balance Assessed: Yes Standardized Balance Assessment Standardized Balance Assessment: Berg Balance Test;Dynamic Gait Index Berg Balance Test Sit to Stand: Able to stand  independently using hands Standing Unsupported: Able to stand safely 2 minutes Sitting with Back Unsupported but Feet Supported on Floor or Stool: Able to sit safely and securely 2 minutes Stand to Sit: Sits safely with minimal use of hands Transfers: Able to transfer safely, minor use of hands Standing Unsupported with Eyes Closed: Able to stand 10 seconds safely Standing Ubsupported with Feet Together: Able to place feet together independently and stand 1 minute safely From  Standing, Reach Forward with Outstretched Arm: Can reach confidently >25 cm (10") From Standing Position, Pick up Object from Floor: Able to pick up shoe safely and easily From Standing Position, Turn  to Look Behind Over each Shoulder: Looks behind from both sides and weight shifts well Turn 360 Degrees: Able to turn 360 degrees safely in 4 seconds or less Standing Unsupported, Alternately Place Feet on Step/Stool: Able to stand independently and complete 8 steps >20 seconds Standing Unsupported, One Foot in Front: Able to place foot tandem independently and hold 30 seconds Standing on One Leg: Able to lift leg independently and hold > 10 seconds Total Score: 54 Dynamic Gait Index Level Surface: Mild Impairment Change in Gait Speed: Mild Impairment Gait with Horizontal Head Turns: Normal Gait with Vertical Head Turns: Normal Gait and Pivot Turn: Mild Impairment Step Over Obstacle: Normal Step Around Obstacles: Normal Steps: Mild Impairment Total Score: 20 Static Sitting Balance Static Sitting - Balance Support: Feet unsupported Static Sitting - Level of Assistance: 7: Independent Dynamic Sitting Balance Dynamic Sitting - Balance Support: Feet supported Dynamic Sitting - Level of Assistance: 7: Independent Static Standing Balance Static Standing - Balance Support: No upper extremity supported Static Standing - Level of Assistance: 5: Stand by assistance Dynamic Standing Balance Dynamic Standing - Balance Support: No upper extremity supported Dynamic Standing - Level of Assistance: 5: Stand by assistance Extremity Assessment  RUE Assessment RUE Assessment: Within Functional Limits LUE Assessment LUE Assessment: Exceptions to WFL LUE AROM (degrees) Overall AROM Left Upper Extremity: Deficits LUE Strength LUE Overall Strength: Deficits LUE Overall Strength Comments: grossly 4/5 shoulder flexion and abduction and elbow extension; 4+/5 elbow flexion; grip 4-/5 RLE Assessment RLE  Assessment: Within Functional Limits LLE Assessment LLE Assessment: Exceptions to WFL LLE AROM (degrees) Overall AROM Left Lower Extremity: Within functional limits for tasks assessed LLE Strength LLE Overall Strength: Deficits LLE Overall Strength Comments: hip flexion 4/5, knee grossly 4+/5, ankle DF 4+/5  FIM:  FIM - Bed/Chair Transfer Bed/Chair Transfer: 7: Supine > Sit: No assist;7: Sit > Supine: No assist;4: Bed > Chair or W/C: Min A (steadying Pt. > 75%);4: Chair or W/C > Bed: Min A (steadying Pt. > 75%) FIM - Locomotion: Wheelchair Locomotion: Wheelchair: 0: Activity did not occur FIM - Locomotion: Ambulation Ambulation/Gait Assistance: 4: Min guard Locomotion: Ambulation: 4: Travels 150 ft or more with minimal assistance (Pt.>75%) FIM - Locomotion: Stairs Locomotion: Scientist, physiological: Hand rail - 2 Locomotion: Stairs: 4: Up and Down 12 - 14 stairs with minimal assistance (Pt.>75%)   Refer to Care Plan for Long Term Goals  Recommendations for other services: None  Discharge Criteria: Patient will be discharged from PT if patient refuses treatment 3 consecutive times without medical reason, if treatment goals not met, if there is a change in medical status, if patient makes no progress towards goals or if patient is discharged from hospital.  The above assessment, treatment plan, treatment alternatives and goals were discussed and mutually agreed upon: by patient  Sanjuana Letters 02/08/2015, 12:51 PM

## 2015-02-08 NOTE — Progress Notes (Signed)
Recreational Therapy Session Note  Patient Details  Name: Dennis [name] MRN: [id number] Date of Birth: 08/03/1961 Today's Date: 02/08/2015  Familiar with pt from previous CIR admission.  Met with pt briefly today.  Pt requested need for toileting, pt requires contact guard assist to ambulate to the bathroom.  Pt states he is planning to discharge at the beginning of next week.  Dicussed pt status with OT, and OT affirms that discharge should be early next week.  Full eval deferred at this time due to short LOS.   Elbert 02/08/2015, 3:11 PM

## 2015-02-08 NOTE — Evaluation (Signed)
Occupational Therapy Assessment and Plan  Patient Details  Name: Dennis [name] MRN: [id number] Date of Birth: Jan 25, 1961  OT Diagnosis: abnormal posture, cognitive deficits, hemiplegia affecting non-dominant side and muscle weakness (generalized) Rehab Potential: Rehab Potential (ACUTE ONLY): Excellent ELOS: 5-7 days   Today's Date: 02/08/2015 OT Individual Time: 4709-6283 OT Individual Time Calculation (min): 60 min     Problem List:  Patient Active Problem List   Diagnosis Date Noted  . Acute CVA (cerebrovascular accident) 02/07/2015  . HLD (hyperlipidemia)   . Status post PICC central line placement   . History of ischemic right MCA stroke   . Type 2 diabetes mellitus with complication   . Acute on chronic systolic congestive heart failure   . Stroke   . Stroke with cerebral ischemia   . CVA (cerebral infarction) 01/26/2015  . Left-sided weakness   . Embolic stroke involving right middle cerebral artery   . Cardiac LV ejection fraction 10-20%   . LV non-compaction cardiomyopathy   . Acute systolic heart failure 66/29/4765  . Acute ischemic stroke 01/24/2015  . Cocaine abuse 01/24/2015  . H/O noncompliance with medical treatment, presenting hazards to health 01/24/2015  . Tobacco abuse 01/24/2015  . Essential hypertension 01/24/2015  . Hypotension 01/24/2015  . Hyperlipidemia 01/24/2015  . Dysphagia following cerebral infarction 01/24/2015  . Hemiplegia affecting left nondominant side 01/24/2015  . Diabetes mellitus type 2, uncontrolled, with complications 46/50/3546  . Hyperlipidemia associated with type 2 diabetes mellitus 11/30/2014  . CAD (coronary artery disease) 11/16/2014  . Acute on chronic systolic CHF (congestive heart failure) 09/21/2014  . Chest pain on breathing 09/21/2014  . Chronic systolic heart failure 56/81/2751  . Acute on chronic systolic heart failure   . Automatic implantable cardioverter-defibrillator in situ   . Left ventricular noncompaction  05/27/2014  . Dyspnea 05/26/2014  . Chest discomfort 08/09/2013  . Long term (current) use of anticoagulants 08/09/2013  . Drug abuse and dependence   . CHF (congestive heart failure)   . BP (high blood pressure) 03/04/2013  . Cardiomyopathy, ischemic 07/21/2011  . Type II diabetes mellitus with ophthalmic manifestations 09/13/2007  . TOBACCO ABUSE 09/13/2007  . WEIGHT LOSS, ABNORMAL 09/13/2007  . SOB 09/13/2007    Past Medical History:  Past Medical History  Diagnosis Date  . Chronic systolic heart failure     a) Mixed ICM/NICM b) RHC (05/2014): RA 2, RV 19/2/3, PA 22/14 (18), PCWP 6, Fick CO/CI: 5.2 / 2.7, PVR 2.3 WU, PA 60% and 64% c) ECHO (05/2014): EF 20-25%, diff HK, akinesis entireanteroseptal myocardium, triv AI, mod MR, LA mod/sev dilated  . Automatic implantable cardioverter-defibrillator in situ   . Heart murmur   . Sleep apnea     "cleared after T&A"  . Left ventricular noncompaction   . Ischemic cardiomyopathy     a) Coronary angiography (12/2008) at [facility]: Lmain: nl, LAD mid 100% stenosis with left to left and right-to-left collaterals to the distal LAD; Lcx: nl, RCA nl.    . Type II diabetes mellitus   . High cholesterol   . Pneumonia 1988  . Drug abuse and dependence   . CHF (congestive heart failure)    Past Surgical History:  Past Surgical History  Procedure Laterality Date  . Forearm fracture surgery Left 1980  . Cardiac defibrillator placement  03/2011  . Cholecystectomy  1994  . Right heart catheterization N/A 05/30/2014    Procedure: RIGHT HEART CATH;  Surgeon: Jolaine Artist, MD;  Location: [facility] CATH LAB;  Service: Cardiovascular;  Laterality: N/A;  . Fracture surgery    . Cardiac catheterization  05/2014  . Tonsillectomy and adenoidectomy  ~ 1998  . Right heart catheterization N/A 02/07/2015    Procedure: RIGHT HEART CATH;  Surgeon: Jolaine Artist, MD;  Location: [facility] CATH LAB;  Service: Cardiovascular;  Laterality: N/A;    Assessment &  Plan Clinical Impression: Patient is a 54 y.o. year old male with recent admission to the hospital on 01/24/15 with slurred speech, left facial droop and left sided weakness. Blood sugar- 536, INR 1.02 and UDS positive for cocaine. CT head with early signs of R-MCA infarct and CTA head/neck done revealing evolving anterior division right MCA infarct. Dr. Erlinda Hong recommended resuming Coumadin for embolic stroke due to low EF. He has had complaints of SOB and was treated with dose of IV lasix. patient with resultant left hemiparesis, left inattention, fluctuating bouts of lethargy as well as poorinsight and awareness.  He was admitted to rehab 01/26/2015 for inpatient therapies to consist of PT, ST and OT at least three hours five days a week. Diabetes was monitored with ac/hs checks and Lantus insulin was titrated to 25 for better BS control. He was encouraged to push po fluids due to nectar liquids. He was maintained on lasix every other day but had complaints of worsening of SOB with fatigue due to CHF. He was transferred back to acute for suspected lowput HF.  Patient transferred to CIR on 02/07/2015 .    Patient currently requires supervision with basic self-care skills secondary to muscle weakness, impaired timing and sequencing, unbalanced muscle activation and decreased coordination and decreased standing balance, hemiplegia and decreased balance strategies.  Prior to hospitalization, patient could complete ADLs with independent .  Patient will benefit from skilled intervention to decrease level of assist with basic self-care skills, increase independence with basic self-care skills and increase level of independence with iADL prior to discharge home with care partner.  Anticipate patient will require intermittent supervision and follow up outpatient.  OT - End of Session Activity Tolerance: Tolerates 30+ min activity with multiple rests Endurance Deficit: Yes Endurance Deficit Description:  cardiorespiratory OT Assessment Rehab Potential (ACUTE ONLY): Excellent OT Patient demonstrates impairments in the following area(s): Balance;Cognition;Endurance;Motor OT Basic ADL's Functional Problem(s): Eating;Grooming;Bathing;Dressing;Toileting OT Advanced ADL's Functional Problem(s): Simple Meal Preparation OT Transfers Functional Problem(s): Toilet;Tub/Shower OT Additional Impairment(s): Fuctional Use of Upper Extremity OT Plan OT Intensity: Minimum of 1-2 x/day, 45 to 90 minutes OT Frequency: 5 out of 7 days OT Duration/Estimated Length of Stay: 5-7 days OT Treatment/Interventions: Balance/vestibular training;Cognitive remediation/compensation;Community reintegration;Discharge planning;Functional mobility training;Psychosocial support;Therapeutic Activities;UE/LE Coordination activities;Patient/family education;DME/adaptive equipment instruction;UE/LE Strength taining/ROM;Therapeutic Exercise;Self Care/advanced ADL retraining;Neuromuscular re-education OT Self Feeding Anticipated Outcome(s): independent OT Basic Self-Care Anticipated Outcome(s): modified independent OT Toileting Anticipated Outcome(s): modified independent OT Bathroom Transfers Anticipated Outcome(s): modified independent OT Recommendation Patient destination: Home Follow Up Recommendations: Outpatient OT Equipment Recommended: To be determined   Skilled Therapeutic Intervention Pt performed all bathing in standing with close supervision including proping his LEs up, one at a time to wash and dry them without loss of balance.  He did need mod instructional cueing to remember to wash his UB as he progressed from washing his hair to his LEs.  Therapist provided distraction of conversation during task as well which divided his attention.  Also noted that pt used the LUE spontaneously during the bathing and dressing tasks but he did drop the soap multiple times in the shower when holding in  his left hand.  He did not make  an adjustment to keep this from happening.  Dressing sit to stand at the EOB with close supervision.   OT Evaluation  Vital Signs Therapy Vitals Pulse Rate: 94 BP: 92/63 mmHg Pain Pain Assessment Pain Assessment: No/denies pain Home Living/Prior Functioning Home Living Living Arrangements: Parent Available Help at Discharge: Family, Available 24 hours/day Type of Home: House Home Access: Stairs to enter [employer] of Steps: 1 step into back entry Entrance Stairs-Rails: None Home Layout: Multi-level, 1/2 bath on main level, Bed/bath upstairs Alternate Level Stairs-Number of Steps: 12 (first 5 steps rail on R and no wall L; next 7 steps rail on L and wall on R) Alternate Level Stairs-Rails: Right, Left  Lives With: Family Prior Function Level of Independence: Independent with gait, Independent with transfers  Able to Take Stairs?: Yes Driving: Yes Vocation: On disability Comments: Pt reports he drives and is independent with IADLs  ADL  See FIM scale for details  Vision/Perception  Vision- History Baseline Vision/History: No visual deficits Patient Visual Report: No change from baseline Vision- Assessment Vision Assessment?: No apparent visual deficits Eye Alignment: Within Functional Limits Perception Comments: Pt still with mild inattention to LUE use but has improved since last CIR admission.  Cognition Overall Cognitive Status: Impaired/Different from baseline Arousal/Alertness: Awake/alert Orientation Level: Oriented X4 Attention: Alternating Focused Attention: Appears intact Sustained Attention: Appears intact Sustained Attention Impairment: Functional basic Selective Attention: Appears intact Alternating Attention: Appears intact Divided Attention: Impaired Divided Attention Impairment: Functional complex Memory: Appears intact Awareness: Appears intact Awareness Impairment: Emergent impairment Problem Solving: Impaired Problem Solving  Impairment: Functional complex Executive Function: Self Monitoring;Self Correcting Self Monitoring: Impaired Self Monitoring Impairment: Functional complex Self Correcting: Impaired Self Correcting Impairment: Functional complex Safety/Judgment: Appears intact Comments: Pt continually dropping the soap in the shower 3-5 times and was able to state awareness of it but did not make adjustment to hold it with the right hand instead of the left.  Noted pt needed mod demonstrational cueing to sequence bathing as he had difficulty dividing his attention between bathing and conversation.       Sensation Sensation Light Touch: Appears Intact Stereognosis: Appears Intact Hot/Cold: Appears Intact Proprioception: Appears Intact Coordination Gross Motor Movements are Fluid and Coordinated: No Fine Motor Movements are Fluid and Coordinated: No Coordination and Movement Description: Pt with decreased gross and FM coordination in the LUE but he was able to tie his shoes with increased time.  slight shoulder elevation noted in the LUE with shoulder flexion.   Motor  Motor Motor: Hemiplegia Motor - Skilled Clinical Observations: Pt with mild LUE and LLE hemiparesis Mobility  Bed Mobility Bed Mobility: Supine to Sit Supine to Sit: 6: Modified independent (Device/Increase time) Transfers Transfers: Sit to Stand;Stand to Sit Sit to Stand: 5: Supervision;With upper extremity assist;From bed;From chair/3-in-1;From toilet Stand to Sit: 5: Supervision;To bed;To chair/3-in-1;To toilet  Trunk/Postural Assessment  Cervical Assessment Cervical Assessment: Exceptions to [facility] (slight cervical protraction present) Thoracic Assessment Thoracic Assessment: Within Functional Limits Lumbar Assessment Lumbar Assessment: Within Functional Limits Postural Control Postural Control: Deficits on evaluation Postural Limitations: Pt with increased right trunk flexion during stance phase with ambulation.     Balance Balance Balance Assessed: Yes Static Sitting Balance Static Sitting - Balance Support: Feet supported Static Sitting - Level of Assistance: 7: Independent Dynamic Sitting Balance Dynamic Sitting - Balance Support: Feet supported Dynamic Sitting - Level of Assistance: 7: Independent Static Standing Balance Static Standing - Balance Support:  No upper extremity supported Static Standing - Level of Assistance: 5: Stand by assistance Dynamic Standing Balance Dynamic Standing - Balance Support: No upper extremity supported Dynamic Standing - Level of Assistance: 5: Stand by assistance Extremity/Trunk Assessment RUE Assessment RUE Assessment: Within Functional Limits LUE Assessment LUE Assessment: Exceptions to [facility] LUE Strength LUE Overall Strength: Deficits LUE Overall Strength Comments: Pt currently Brunstrum stage V in the left arm and stage VI in the hand.  Pt demonstrates slight left shoulder hike with flexion but overall is absent of synergy pattern with bathing, dressing, grooming tasks.  He is able to tie his shoes but needs increased time with opposition of thumb to all digits present.   FIM:  FIM - Eating Eating Activity: 7: Complete independence:no helper FIM - Grooming Grooming Steps: Wash, rinse, dry face;Wash, rinse, dry hands Grooming: 5: Supervision: safety issues or verbal cues FIM - Bathing Bathing Steps Patient Completed: Chest;Left Arm;Abdomen;Front perineal area;Buttocks;Right upper leg;Left upper leg;Right lower leg (including foot);Right Arm;Left lower leg (including foot) Bathing: 5: Supervision: Safety issues/verbal cues FIM - Upper Body Dressing/Undressing Upper body dressing/undressing steps patient completed: Thread/unthread right sleeve of pullover shirt/dresss;Thread/unthread left sleeve of pullover shirt/dress;Put head through opening of pull over shirt/dress;Pull shirt over trunk Upper body dressing/undressing: 5: Supervision: Safety issues/verbal  cues FIM - Lower Body Dressing/Undressing Lower body dressing/undressing steps patient completed: Thread/unthread right underwear leg;Pull underwear up/down;Thread/unthread right pants leg;Pull pants up/down;Thread/unthread left underwear leg;Thread/unthread left pants leg;Don/Doff right sock;Don/Doff left sock;Don/Doff left shoe;Fasten/unfasten right shoe;Don/Doff right shoe;Fasten/unfasten left shoe Lower body dressing/undressing: 5: Supervision: Safety issues/verbal cues FIM - Bed/Chair Transfer Bed/Chair Transfer: 6: More than reasonable amt of time;5: Bed > Chair or W/C: Supervision (verbal cues/safety issues) FIM - Systems developer Devices: Tub transfer bench;Walk in shower Tub/shower Transfers: 5-Into Tub/Shower: Supervision (verbal cues/safety issues);5-Out of Tub/Shower: Supervision (verbal cues/safety issues)   Refer to Care Plan for Long Term Goals  Recommendations for other services: None  Discharge Criteria: Patient will be discharged from OT if patient refuses treatment 3 consecutive times without medical reason, if treatment goals not met, if there is a change in medical status, if patient makes no progress towards goals or if patient is discharged from hospital.  The above assessment, treatment plan, treatment alternatives and goals were discussed and mutually agreed upon: by patient  MCGUIRE,JAMES OTR/L 02/08/2015, 4:49 PM

## 2015-02-08 NOTE — Progress Notes (Signed)
Social Work Assessment and Plan Social Work Assessment and Plan  Patient Details  Name: Dennis [name] MRN: [id number] Date of Birth: [birth year]  Today's Date: 02/08/2015  Problem List:  Patient Active Problem List   Diagnosis Date Noted  . Acute CVA (cerebrovascular accident) 02/07/2015  . HLD (hyperlipidemia)   . Status post PICC central line placement   . History of ischemic right MCA stroke   . Type 2 diabetes mellitus with complication   . Acute on chronic systolic congestive heart failure   . Stroke   . Stroke with cerebral ischemia   . CVA (cerebral infarction) 01/26/2015  . Left-sided weakness   . Embolic stroke involving right middle cerebral artery   . Cardiac LV ejection fraction 10-20%   . LV non-compaction cardiomyopathy   . Acute systolic heart failure 73/22/0254  . Acute ischemic stroke 01/24/2015  . Cocaine abuse 01/24/2015  . H/O noncompliance with medical treatment, presenting hazards to health 01/24/2015  . Tobacco abuse 01/24/2015  . Essential hypertension 01/24/2015  . Hypotension 01/24/2015  . Hyperlipidemia 01/24/2015  . Dysphagia following cerebral infarction 01/24/2015  . Hemiplegia affecting left nondominant side 01/24/2015  . Diabetes mellitus type 2, uncontrolled, with complications 27/03/2375  . Hyperlipidemia associated with type 2 diabetes mellitus 11/30/2014  . CAD (coronary artery disease) 11/16/2014  . Acute on chronic systolic CHF (congestive heart failure) 09/21/2014  . Chest pain on breathing 09/21/2014  . Chronic systolic heart failure 28/31/5176  . Acute on chronic systolic heart failure   . Automatic implantable cardioverter-defibrillator in situ   . Left ventricular noncompaction 05/27/2014  . Dyspnea 05/26/2014  . Chest discomfort 08/09/2013  . Long term (current) use of anticoagulants 08/09/2013  . Drug abuse and dependence   . CHF (congestive heart failure)   . BP (high blood pressure) 03/04/2013  . Cardiomyopathy, ischemic  07/21/2011  . Type II diabetes mellitus with ophthalmic manifestations 09/13/2007  . TOBACCO ABUSE 09/13/2007  . WEIGHT LOSS, ABNORMAL 09/13/2007  . SOB 09/13/2007   Past Medical History:  Past Medical History  Diagnosis Date  . Chronic systolic heart failure     a) Mixed ICM/NICM b) RHC (05/2014): RA 2, RV 19/2/3, PA 22/14 (18), PCWP 6, Fick CO/CI: 5.2 / 2.7, PVR 2.3 WU, PA 60% and 64% c) ECHO (05/2014): EF 20-25%, diff HK, akinesis entireanteroseptal myocardium, triv AI, mod MR, LA mod/sev dilated  . Automatic implantable cardioverter-defibrillator in situ   . Heart murmur   . Sleep apnea     "cleared after T&A"  . Left ventricular noncompaction   . Ischemic cardiomyopathy     a) Coronary angiography (12/2008) at [facility]: Lmain: nl, LAD mid 100% stenosis with left to left and right-to-left collaterals to the distal LAD; Lcx: nl, RCA nl.    . Type II diabetes mellitus   . High cholesterol   . Pneumonia 1988  . Drug abuse and dependence   . CHF (congestive heart failure)    Past Surgical History:  Past Surgical History  Procedure Laterality Date  . Forearm fracture surgery Left 1980  . Cardiac defibrillator placement  03/2011  . Cholecystectomy  1994  . Right heart catheterization N/A 05/30/2014    Procedure: RIGHT HEART CATH;  Surgeon: Jolaine Artist, MD;  Location: [facility] CATH LAB;  Service: Cardiovascular;  Laterality: N/A;  . Fracture surgery    . Cardiac catheterization  05/2014  . Tonsillectomy and adenoidectomy  ~ 1998  . Right heart catheterization N/A 02/07/2015    Procedure:  RIGHT HEART CATH;  Surgeon: Jolaine Artist, MD;  Location: [facility] CATH LAB;  Service: Cardiovascular;  Laterality: N/A;   Social History:  reports that he has been smoking Cigarettes.  He has a 3.1 pack-year smoking history. He has never used smokeless tobacco. He reports that he drinks alcohol. He reports that he does not use illicit drugs.  Family / Support Systems Marital Status: Single Patient  Roles: Partner Spouse/Significant Other: Pat Keith-girlfriend [phone]-cell Children: Has a 7 yo daughter who stays with him Wed & Thurs Other Supports: Alstyne Sedlack-Mom [phone]-cell Anticipated Caregiver: Mom and girlfriend Ability/Limitations of Caregiver: Mom is 54 yo and will not assist much, Pat can assist Caregiver Availability: Other (Comment) (At times will be alone) Family Dynamics: Close with Mom and Pat.  Tends to do what he wants which frustrates his mother at times.  She states: " You can't tell him anything he knows best."  Pt wants to do well and not need assist at discharge.   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    Abuse/Neglect Physical Abuse: Denies Verbal Abuse: Denies Sexual Abuse: Denies Exploitation of patient/patient's resources: Denies Self-Neglect: Denies  Emotional Status Pt's affect, behavior adn adjustment status: Pt feels much better now than he did when he was here last week.  He feels he is ready to work in therapies and make progress.  He wants to regain his independence from this stroke and build up his staminia, he has lost while here.  He feels this is his second chance. Recent Psychosocial Issues: Other health issues-cardiac issues Pyschiatric History: No history may benefit from Neuro-psych while here for coping.  Will make referral while here and monitor his coping, he seems to be having a good day today. Substance Abuse History: History of cocaine abuse-on drug screen he was positive, but he denies using and could not answer the positive drug test. He does go to NA he reprots three times a day, but this worker wonders if he actually he  does this, that is a lot of meetings.  Will attempt to address while here.  Patient / Family Perceptions, Expectations & Goals Pt/Family understanding of illness & functional limitations: Pt and [name] can explain his stroke and cardiac issues and is pleased with how he is doing now and how he feels.  He asks questions and feels his concerns are being addressed.   Premorbid pt/family roles/activities: father, son, Boyfriend, NA participate, brother, etc Anticipated changes in roles/activities/participation: resume Pt/family expectations/goals: Pt states: " I want to be independent before I leave here."  [name] states: " I hope hs is mobile and as close to independent as possible."  [employer]: None Premorbid Home Care/DME Agencies: None Transportation available at discharge: Family and Film/video editor referrals recommended: Neuropsychology, Support group (specify)  Discharge Planning Living Arrangements: Parent Support Systems: Spouse/significant other, Parent, Water engineer, Organized support group (Comment) Type of Residence: Private residence Insurance underwriter Resources: Multimedia programmer (specify) Primary school teacher) Financial Resources: [employer] Financial Screen Referred: No Living Expenses: Lives with family Money Management: Patient Does the patient have any problems obtaining your medications?: Yes (Describe) (Can't afford the co-pays) Home Management: Mom Patient/Family Preliminary Plans: Return home with Mom who is there but can not assist pt.  His girlfriend [name] will assist but works  and does not live with them. he will need to be mod/i to return home, since Mom can not assist. Will await team's evaluations.  Will evaluate if pt is eligible for Medicaid- and have him apply.   Social Work Anticipated Follow Up Needs: HH/OP, Support Group  Clinical Impression Pleasant gentleman known to this worker from past CIR admission. Will continue to work on discharge plans and  provide support to pt.  He is prepared to change his lifestyle habits due to this is his second chance. Will await team's evaluations and work on plan.  Elease Hashimoto 02/08/2015, 1:07 PM

## 2015-02-08 NOTE — Evaluation (Addendum)
Speech Language Pathology Assessment and Plan  Patient Details  Name: Dennis [name] MRN: [id number] Date of Birth: 21-Aug-1961  SLP Diagnosis: Cognitive Impairments Rehab Potential: Excellent ELOS: 7 days     Today's Date: 02/08/2015 SLP Individual Time: 9169-4503 SLP Individual Time Calculation (min): 50 min   Problem List:  Patient Active Problem List   Diagnosis Date Noted  . Acute CVA (cerebrovascular accident) 02/07/2015  . HLD (hyperlipidemia)   . Status post PICC central line placement   . History of ischemic right MCA stroke   . Type 2 diabetes mellitus with complication   . Acute on chronic systolic congestive heart failure   . Stroke   . Stroke with cerebral ischemia   . CVA (cerebral infarction) 01/26/2015  . Left-sided weakness   . Embolic stroke involving right middle cerebral artery   . Cardiac LV ejection fraction 10-20%   . LV non-compaction cardiomyopathy   . Acute systolic heart failure 88/82/8003  . Acute ischemic stroke 01/24/2015  . Cocaine abuse 01/24/2015  . H/O noncompliance with medical treatment, presenting hazards to health 01/24/2015  . Tobacco abuse 01/24/2015  . Essential hypertension 01/24/2015  . Hypotension 01/24/2015  . Hyperlipidemia 01/24/2015  . Dysphagia following cerebral infarction 01/24/2015  . Hemiplegia affecting left nondominant side 01/24/2015  . Diabetes mellitus type 2, uncontrolled, with complications 49/17/9150  . Hyperlipidemia associated with type 2 diabetes mellitus 11/30/2014  . CAD (coronary artery disease) 11/16/2014  . Acute on chronic systolic CHF (congestive heart failure) 09/21/2014  . Chest pain on breathing 09/21/2014  . Chronic systolic heart failure 56/97/9480  . Acute on chronic systolic heart failure   . Automatic implantable cardioverter-defibrillator in situ   . Left ventricular noncompaction 05/27/2014  . Dyspnea 05/26/2014  . Chest discomfort 08/09/2013  . Long term (current) use of anticoagulants  08/09/2013  . Drug abuse and dependence   . CHF (congestive heart failure)   . BP (high blood pressure) 03/04/2013  . Cardiomyopathy, ischemic 07/21/2011  . Type II diabetes mellitus with ophthalmic manifestations 09/13/2007  . TOBACCO ABUSE 09/13/2007  . WEIGHT LOSS, ABNORMAL 09/13/2007  . SOB 09/13/2007   Past Medical History:  Past Medical History  Diagnosis Date  . Chronic systolic heart failure     a) Mixed ICM/NICM b) RHC (05/2014): RA 2, RV 19/2/3, PA 22/14 (18), PCWP 6, Fick CO/CI: 5.2 / 2.7, PVR 2.3 WU, PA 60% and 64% c) ECHO (05/2014): EF 20-25%, diff HK, akinesis entireanteroseptal myocardium, triv AI, mod MR, LA mod/sev dilated  . Automatic implantable cardioverter-defibrillator in situ   . Heart murmur   . Sleep apnea     "cleared after T&A"  . Left ventricular noncompaction   . Ischemic cardiomyopathy     a) Coronary angiography (12/2008) at [facility]: Lmain: nl, LAD mid 100% stenosis with left to left and right-to-left collaterals to the distal LAD; Lcx: nl, RCA nl.    . Type II diabetes mellitus   . High cholesterol   . Pneumonia 1988  . Drug abuse and dependence   . CHF (congestive heart failure)    Past Surgical History:  Past Surgical History  Procedure Laterality Date  . Forearm fracture surgery Left 1980  . Cardiac defibrillator placement  03/2011  . Cholecystectomy  1994  . Right heart catheterization N/A 05/30/2014    Procedure: RIGHT HEART CATH;  Surgeon: Jolaine Artist, MD;  Location: [facility] CATH LAB;  Service: Cardiovascular;  Laterality: N/A;  . Fracture surgery    . Cardiac  catheterization  05/2014  . Tonsillectomy and adenoidectomy  ~ 1998  . Right heart catheterization N/A 02/07/2015    Procedure: RIGHT HEART CATH;  Surgeon: Jolaine Artist, MD;  Location: [facility] CATH LAB;  Service: Cardiovascular;  Laterality: N/A;    Assessment / Plan / Recommendation Clinical Impression   Dennis [name] is a 54 y.o. male with history of chronic systolic HF, OSA, DM type  2, Left ventricular non-compaction on chronic coumadin, s/p ICD, medication non-compliance; who was found down by family on 01/24/15 with slurred speech, left facial droop and left sided weakness. CT head with early signs of R-MCA infarct  Therapy ongoing and patient continues to be limited by left hemiparesis with LUE inattention, left visual deficits, poor safety awareness with distractibility requiring redirection to tasks.  Pt admitted to CIR on 02/07/2015.  SLP evaluation completed 02/08/2015 with the following results: Pt presents with mild higher level cognitive deficits characterized by decreased emergent awareness of deficits, decreased recall of complex information, and decreased functional problem solving for semi-complex tasks.  Pt with grossly intact selective/alternating attention, safety awareness, and recall of daily information.  Pt reports good toleration of regular textures and thin liquids; therefore, no bedside swallowing evaluation completed on this date.  Pt would  benefit from skilled ST while inpatient in order to maximize functional independence and reduce burden of care prior to discharge via education and practice of compensatory strategies for cognition.  Do not anticipate ST follow up needs at discharge although pt would benefit from initial assistance from medication and financial management.     Skilled Therapeutic Interventions          Cognitive-linguistic evaluation completed with results and recommendations reviewed with patient.     SLP Assessment  Patient will need skilled [facility] during CIR admission    Recommendations  Patient destination: Home Follow up Recommendations: None Equipment Recommended: None recommended by SLP    SLP Frequency 1 to 3 out of 7 days   SLP Treatment/Interventions Cognitive remediation/compensation;Cueing hierarchy;Dysphagia/aspiration precaution training;Environmental controls;Functional  tasks;Internal/external aids;Patient/family education;Therapeutic Activities    Pain Pain Assessment Pain Assessment: No/denies pain Prior Functioning Cognitive/Linguistic Baseline: Within functional limits Type of Home: House  Lives With: Family Available Help at Discharge: Family;Available 24 hours/day Vocation: On disability  Short Term Goals: Week 1: SLP Short Term Goal 1 (Week 1): Pt will complete complex problem solving during functional tasks with mod I over 2 consecutive sessions.  SLP Short Term Goal 2 (Week 1): Pt will recall complex information with mod I use of compensatory aids.   SLP Short Term Goal 3 (Week 1): Pt will recognize and correct errors during semi-complex tasks with mod I  See FIM for current functional status Refer to Care Plan for Long Term Goals  Recommendations for other services: None  Discharge Criteria: Patient will be discharged from SLP if patient refuses treatment 3 consecutive times without medical reason, if treatment goals not met, if there is a change in medical status, if patient makes no progress towards goals or if patient is discharged from hospital.  The above assessment, treatment plan, treatment alternatives and goals were discussed and mutually agreed upon: by patient  Emilio Math 02/08/2015, 4:51 PM

## 2015-02-09 ENCOUNTER — Inpatient Hospital Stay (HOSPITAL_COMMUNITY): Payer: Medicare Other | Admitting: Physical Therapy

## 2015-02-09 ENCOUNTER — Ambulatory Visit (HOSPITAL_COMMUNITY): Payer: Self-pay

## 2015-02-09 ENCOUNTER — Inpatient Hospital Stay (HOSPITAL_COMMUNITY): Payer: Medicare Other | Admitting: Speech Pathology

## 2015-02-09 ENCOUNTER — Inpatient Hospital Stay (HOSPITAL_COMMUNITY): Payer: Medicare Other | Admitting: Occupational Therapy

## 2015-02-09 LAB — GLUCOSE, CAPILLARY
Glucose-Capillary: 188 mg/dL — ABNORMAL HIGH (ref 70–99)
Glucose-Capillary: 246 mg/dL — ABNORMAL HIGH (ref 70–99)
Glucose-Capillary: 118 mg/dL — ABNORMAL HIGH (ref 70–99)
Glucose-Capillary: 103 mg/dL — ABNORMAL HIGH (ref 70–99)
Glucose-Capillary: 161 mg/dL — ABNORMAL HIGH (ref 70–99)

## 2015-02-09 LAB — PROTIME-INR
INR: 2.84 — ABNORMAL HIGH (ref 0.00–1.49)
Prothrombin Time: 30 seconds — ABNORMAL HIGH (ref 11.6–15.2)

## 2015-02-09 LAB — CARBOXYHEMOGLOBIN
Carboxyhemoglobin: 1.9 % — ABNORMAL HIGH (ref 0.5–1.5)
Methemoglobin: 0.8 % (ref 0.0–1.5)
O2 Saturation: 59.6 %
Total hemoglobin: 10.9 g/dL — ABNORMAL LOW (ref 13.5–18.0)

## 2015-02-09 MED ORDER — IVABRADINE HCL 5 MG PO TABS
7.5000 mg | ORAL_TABLET | Freq: Two times a day (BID) | ORAL | Status: DC
  Administered 2015-02-09 – 2015-02-14 (×10): 7.5 mg via ORAL
  Filled 2015-02-09 (×12): qty 2

## 2015-02-09 MED ORDER — HYDROCERIN EX CREA
TOPICAL_CREAM | Freq: Two times a day (BID) | CUTANEOUS | Status: DC
  Administered 2015-02-09 – 2015-02-10 (×3): via TOPICAL
  Administered 2015-02-10: 1 via TOPICAL
  Administered 2015-02-11: 08:00:00 via TOPICAL
  Administered 2015-02-11: 1 via TOPICAL
  Administered 2015-02-12: 09:00:00 via TOPICAL
  Administered 2015-02-12: 1 via TOPICAL
  Administered 2015-02-13 – 2015-02-14 (×3): via TOPICAL
  Filled 2015-02-09: qty 113

## 2015-02-09 NOTE — Progress Notes (Signed)
Speech Language Pathology Daily Session Note  Patient Details  Name: Dennis [name] MRN: [id number] Date of Birth: September 01, 1961  Today's Date: 02/09/2015 SLP Individual Time: 0900-1000 SLP Individual Time Calculation (min): 60 min  Short Term Goals: Week 1: SLP Short Term Goal 1 (Week 1): Pt will complete complex problem solving during functional tasks with mod I over 2 consecutive sessions.  SLP Short Term Goal 2 (Week 1): Pt will recall complex information with mod I use of compensatory aids.   SLP Short Term Goal 3 (Week 1): Pt will recognize and correct errors during semi-complex tasks with mod I  Skilled Therapeutic Interventions: Skilled ST session focused on cognitive function goals. Patient completed complex-level problem solving tasks (money management, Engineer, petroleum) with 100% accuracy and minimal cues from clinician, when given additional time to process. Patient did not indicate any deficits other than needing to work on his endurance. When clinician mentioned that his heels turn inward when he is walking, he did say that he has difficulties with balance, but was not very specific. After 30 minutes of session, patient put head down and fell asleep sitting in chair. When clinician woke him up, he had not realized that he had fallen asleep. Clinician walked with patient back to his room and said he needed to use the bathroom. After he used the bathroom and sat in chair in room, he frequently would close his eyes and fall asleep. RN alerted and BP and blood sugars checked.    FIM:  Comprehension Comprehension Mode: Auditory Comprehension: 5-Understands complex 90% of the time/Cues < 10% of the time Expression Expression Mode: Verbal Expression: 5-Expresses complex 90% of the time/cues < 10% of the time Social Interaction Social Interaction: 6-Interacts appropriately with others with medication or extra time (anti-anxiety, antidepressant). Problem Solving Problem Solving: 5-Solves  complex 90% of the time/cues < 10% of the time Memory Memory: 5-Recognizes or recalls 90% of the time/requires cueing < 10% of the time  Pain Pain Assessment Pain Assessment: No/denies pain Pain Score: 0-No pain  Therapy/Group: Individual Therapy  Dannial Monarch 02/09/2015, 4:56 PM  Dannial Monarch, [place], [place]

## 2015-02-09 NOTE — Progress Notes (Signed)
Physical Therapy Session Note  Patient Details  Name: Dennis [name] MRN: [id number] Date of Birth: [birth year]  Today's Date: 02/09/2015 PT Individual Time: 0017-4944 PT Individual Time Calculation (min): 43 min   Short Term Goals: Week 1:  PT Short Term Goal 1 (Week 1): STG's = LTG's due to ELOS   Therapy Documentation Precautions:  Precautions Precautions: Fall Restrictions Weight Bearing Restrictions: No    Vital Signs: BP: 92/58 HR:102 SpO2: 97% on room air   Mat Mobility: Supine to and from short sit edge of mat with independent   Transfers: Sit to and from stand transfer with close supervision Stand pivot transfer with close supervision  Ambulation: Patient ambulated 175 feetx2 without assistive device close supervision with two episodes of min assist. Patient ambulated with a step through gait pattern. Patient demonstrates uneven step and stride length with a varying cadence. Patient is distracted externally and internally during session. Patient is externally distracted by environment and demonstrates a narrow based of support that leads to a loss of balance. Patient educated on attention to task and improved heel strike during gait. Patient verbalized understanding.   Stair negotiation: Patient negotiated 12 steps with right handrail and min assist. Patient performed stairs with a step over step pattern. Patient educated on proper sequence and technique and was able to return demonstration with minimal verbal cues.   Standing ther ex: Heel raises 30x. Mini squats 30x.  Supine the ex: Bridging 30x with a 5 second hold  Patient onto mat with close supervision assist transition from supine to prone to quadruped position. Patient remained in quadruped position on mat for approximately 5 minutes with emphasis even weight distribution and positioning in midline. Patient also performed bilateral upper extremity extension 10x. The patient performed alternating UE and LE  extension 10x with min assist.   Patient tolerated treatment well. Vitals monitored and remained stable throughout session responding appropriately to activity.  Patient was without pain during session. Patient tolerated session with rest breaks throughout. Patient returned to room at end of session with seated in recliner chair with all needs met. Call bell within reach and patient educated not to be up without assistance. Patient verbalized understanding.   Therapy/Group: Individual Therapy  Retta Diones 02/09/2015, 8:52 AM

## 2015-02-09 NOTE — Progress Notes (Signed)
Advanced Heart Failure Rounding Note   Subjective:   The HF Team was asked to consult for increased dyspnea and tachycardia by Dr Letta Pate. Prior to admit he had been followed in the HF clinic and was last seen 12/01/2014 and he was stable.   54 y/o male with h/o DM2, polysubstance use (tobacco, cocaine), mixed ICM/NICM and chronic systolic HF due to ventricular noncompaction- on chronic coumadin, noncompliance admitted to [facility] with L sided weakness. Neurology consulted. CT showed R MCA stroke. Did not receive TPA due to delay in arrival. Had ECHO that showed EF 10-15% mild RV HK. No obvious LV clot. As he improved he was transferred to CIR.  This week developed dyspnea and fatigue and transferred back to acute for suspected lowput HF. PICC placed and CVP and co-ox ok but BP too low to titrate HF meds. Ivabradine started.  Back CIR 4/27 for ongoing rehab. Weight trending up. Renal function ok 4/28  Feeling better. Tolerating rehab.        Objective:   Weight Range:  Vital Signs:   Temp:  [98.1 F (36.7 C)] 98.1 F (36.7 C) (04/29 0500) Pulse Rate:  [94-98] 98 (04/29 0948) Resp:  [18] 18 (04/29 0500) BP: (88-98)/(61-68) 88/61 mmHg (04/29 0948) SpO2:  [100 %] 100 % (04/29 0500) Weight:  [173 lb 4.5 oz (78.6 kg)] 173 lb 4.5 oz (78.6 kg) (04/29 0500) Last BM Date: 02/08/15  Intake/Output:   Intake/Output Summary (Last 24 hours) at 02/09/15 1047 Last data filed at 02/09/15 0800  Gross per 24 hour  Intake    600 ml  Output   1000 ml  Net   -400 ml   Physical Exam:  General: In bed.  No resp difficulty HEENT: normal Neck: supple. JVP 5 Carotids 2+ bilat; no bruits. No lymphadenopathy or thryomegaly appreciated. Cor: PMI laterally displaced. Tachy Regular rate & rhythm. +s3 2/6 MR Lungs: clear Abdomen: soft, nontender, mildly distended. No hepatosplenomegaly. No bruits or masses. Good bowel sounds. Extremities: no cyanosis, clubbing, rash, edema. LUE weak . RUE PICC Neuro:  alert & orientedx3, cranial nerves grossly intact. Affect flat    Labs: Basic Metabolic Panel:  Recent Labs Lab 02/04/15 0530 02/08/15 0512  NA 135 136  K 4.4 4.4  CL 100 102  CO2 28 27  GLUCOSE 277* 204*  BUN 18 14  CREATININE 0.93 0.91  CALCIUM 8.7 8.8    Liver Function Tests:  Recent Labs Lab 02/08/15 0512  AST 25  ALT 28  ALKPHOS 86  BILITOT 1.1  PROT 5.5*  ALBUMIN 2.8*   No results for input(s): LIPASE, AMYLASE in the last 168 hours. No results for input(s): AMMONIA in the last 168 hours.  CBC:  Recent Labs Lab 02/05/15 0618 02/08/15 0512  WBC 8.9 10.3  HGB 10.7* 10.3*  HCT 33.3* 31.5*  MCV 96.0 96.6  PLT 231 236    Cardiac Enzymes: No results for input(s): CKTOTAL, CKMB, CKMBINDEX, TROPONINI in the last 168 hours.  BNP: BNP (last 3 results)  Recent Labs  12/03/14 0351 01/24/15 2005  BNP 321.1* 166.1*    ProBNP (last 3 results)  Recent Labs  08/13/14 1850 09/21/14 1928 10/02/14 1033  PROBNP 1525.0* 1870.0* 1085.0*      Other results:  Imaging: No results found.   Medications:     Scheduled Medications: . atorvastatin  20 mg Oral q1800  . digoxin  0.25 mg Oral Daily  . feeding supplement (GLUCERNA SHAKE)  237 mL Oral BID BM  .  hydrocerin   Topical BID  . insulin aspart  0-15 Units Subcutaneous TID WC  . insulin aspart  0-5 Units Subcutaneous QHS  . insulin aspart  4 Units Subcutaneous TID WC  . insulin glargine  35 Units Subcutaneous QHS  . ivabradine  5 mg Oral BID WC  . senna-docusate  2 tablet Oral BID  . sodium chloride  10-40 mL Intracatheter Q12H  . spironolactone  12.5 mg Oral Daily  . warfarin  7.5 mg Oral q1800  . Warfarin - Pharmacist Dosing Inpatient   Does not apply q1800    Infusions:    PRN Medications: acetaminophen, alum & mag hydroxide-simeth, bisacodyl, guaiFENesin-dextromethorphan, ondansetron **OR** ondansetron (ZOFRAN) IV, sodium chloride, traZODone   Assessment:  1. CVA, acute with  left-sided weakness 2. Dyspnea 3. Acute on Chronic Systolic Heart Failure- ECHO EF 10-15% with non compaction 4. CAD Coronary angiography (12/2008) with mid LAD totally occluded and collateralized. Continue statin and BB. No ASA he is on coumadin for noncompaction. . 5. DMII  6. Hypotension    Plan/Discussion:    BP remains low and has s3 on exam. BP too low for ACE or b-blocker. Continue dig 0.25 mg daily  Continue 12.5 mg spiro daily.  Continue ivabradine 5 mg twice a day. Check BMET next week.   INR every Mon-Wed-Fri.    Can go to CIR today. We will follow.  Length of Stay: 2   CLEGG,AMY  NP-C  02/09/2015, 10:47 AM  Advanced Heart Failure Team Pager [phone] (M-F; [place])  Please contact [facility] for night-coverage after hours (4p -7a ) and weekends on [email or web address]   Patient seen and examined with Darrick Grinder, NP. We discussed all aspects of the encounter. I agree with the assessment and plan as stated above.   Remains tachycardic and BP soft but overall stable. Volume status looks good. Increase ivabradine to 7.5 bid. I will see again Monday.  Daniel Bensimhon,MD 11:09 AM

## 2015-02-09 NOTE — Progress Notes (Signed)
Physical Therapy Session Note  Patient Details  Name: Dennis [name] MRN: [id number] Date of Birth: November 27, 1960  Today's Date: 02/09/2015 PT Individual Time: 1500-1600 PT Individual Time Calculation (min): 60 min   Short Term Goals: Week 1:  PT Short Term Goal 1 (Week 1): STG's = LTG's due to ELOS  Skilled Therapeutic Interventions/Progress Updates:    Gait Training: PT instructs pt in ambulation without AD x 150' x 2 reps req SBA in controlled environment. PT sets up an obstacle course including: side stepping through narrow passageway, over 5 steps with B rails req CGA for safety, stepping over 3 walking sticks, weaving around 6 cones, ambulating over soft blue mat, up/down 12 stairs with B rails in step over step pattern with CGA on descent for safety, bloor recovery onto soft blue mat - rest break while vitals are taken and all stable - then picking up cones & walking stick   Neuromuscular Reeducation: PT administers Berg Balance Test and pt scores 56/56, indicating low risk of falls.   Therapeutic Exercise: PT instructs pt on nu-step with B LEs/UEs prn x 5 minutes total at L5 for endurance/improving activity tolerance, as pt reports this is what he is most concerned about before going home. Pt req multiple rest breaks  Due to fatigue so PT lowers resistance to L3 and he req rest breaks less frequently.   Pt is progressing with balance and safety. Continue per PT POC.   Therapy Documentation Precautions:  Precautions Precautions: Fall Restrictions Weight Bearing Restrictions: No Vital Signs: Therapy Vitals Pulse Rate: (!) 108 (after obstacle co urse) BP: 104/;76 mmHg Patient Position (if appropriate): Sitting Oxygen Therapy SpO2: 100 % O2 Device: Not Delivered Pulse Oximetry Type: Intermittent Pain: Pain Assessment Pain Assessment: No/denies pain  Balance: Balance Balance Assessed: Yes Standardized Balance Assessment Standardized Balance Assessment: Berg Balance  Test Berg Balance Test Sit to Stand: Able to stand without using hands and stabilize independently Standing Unsupported: Able to stand safely 2 minutes Sitting with Back Unsupported but Feet Supported on Floor or Stool: Able to sit safely and securely 2 minutes Stand to Sit: Sits safely with minimal use of hands Transfers: Able to transfer safely, minor use of hands Standing Unsupported with Eyes Closed: Able to stand 10 seconds safely Standing Ubsupported with Feet Together: Able to place feet together independently and stand 1 minute safely From Standing, Reach Forward with Outstretched Arm: Can reach confidently >25 cm (10") From Standing Position, Pick up Object from Floor: Able to pick up shoe safely and easily From Standing Position, Turn to Look Behind Over each Shoulder: Looks behind from both sides and weight shifts well Turn 360 Degrees: Able to turn 360 degrees safely in 4 seconds or less Standing Unsupported, Alternately Place Feet on Step/Stool: Able to stand independently and safely and complete 8 steps in 20 seconds Standing Unsupported, One Foot in Front: Able to place foot tandem independently and hold 30 seconds (more wobbliness but held with R foot in front) Standing on One Leg: Able to lift leg independently and hold > 10 seconds Total Score: 56  See FIM for current functional status  Therapy/Group: Individual Therapy  HYSLOP,AMANDA M 02/09/2015, 12:18 PM

## 2015-02-09 NOTE — Progress Notes (Addendum)
ANTICOAGULATION CONSULT NOTE - Follow Up Consult  Pharmacy Consult for warfarin Indication: LV noncompaction  No Known Allergies  Patient Measurements: Height: 6' (182.9 cm) Weight: 173 lb 4.5 oz (78.6 kg) IBW/kg (Calculated) : 77.6  Vital Signs: Temp: 98.1 F (36.7 C) (04/29 0500) Temp Source: Oral (04/29 0500) BP: 88/61 mmHg (04/29 0948) Pulse Rate: 98 (04/29 0948)  Labs:  Recent Labs  02/07/15 0440 02/08/15 0512 02/09/15 0505  HGB  --  10.3*  --   HCT  --  31.5*  --   PLT  --  236  --   LABPROT 27.3*  --  30.0*  INR 2.52*  --  2.84*  CREATININE  --  0.91  --     Estimated Creatinine Clearance: 104.2 mL/min (by C-G formula based on Cr of 0.91).   Medications:  Scheduled:  . atorvastatin  20 mg Oral q1800  . digoxin  0.25 mg Oral Daily  . feeding supplement (GLUCERNA SHAKE)  237 mL Oral BID BM  . hydrocerin   Topical BID  . insulin aspart  0-15 Units Subcutaneous TID WC  . insulin aspart  0-5 Units Subcutaneous QHS  . insulin aspart  4 Units Subcutaneous TID WC  . insulin glargine  35 Units Subcutaneous QHS  . ivabradine  5 mg Oral BID WC  . senna-docusate  2 tablet Oral BID  . sodium chloride  10-40 mL Intracatheter Q12H  . spironolactone  12.5 mg Oral Daily  . warfarin  7.5 mg Oral q1800  . Warfarin - Pharmacist Dosing Inpatient   Does not apply q1800    Assessment: Dennis [name] w/ left ventricular noncompation currently on therapeutic coumadin. INR today 2.48. No bleeding noted. Last CBC stable.   AC: Warfarin PTA for hx L ventricular noncompaction -INR 2.8, no bleeding; last CBC 4/21 stable, ASA stopped per neuro rec since INR >2 Last anticoag note 12/01/14>>dose 7.'5mg'$  daily (med rec says '10mg'$  daily?)  Plan:   Coumadin 7.5 mg po daily for now INR mwf  Thank you for allowing pharmacy to be part of this patient's care team  Erin Hearing PharmD., BCPS Clinical Pharmacist Pager [phone] 02/09/2015 10:25 AM

## 2015-02-09 NOTE — Progress Notes (Signed)
Occupational Therapy Session Note  Patient Details  Name: Dennis [name] MRN: [id number] Date of Birth: [birth year]  Today's Date: 02/09/2015 OT Individual Time: 1003-1100 OT Individual Time Calculation (min): 57 min    Short Term Goals: Week 1:  OT Short Term Goal 1 (Week 1): STGs equal to LTGs based on ELOS of 1 week.  Skilled Therapeutic Interventions/Progress Updates:    Pt in recliner at beginning of session sleeping.  Noted he demonstrated greater lethargy this session compared to previous day's session.  BP taken sitting in the recliner at 87/61 and 81/60.  Nursing notified of situation based on orders in chart stating to notify MD if systolic less than 90.  Continued with session having pt gather items to complete some bathing and grooming tasks at the sink.  Pt able to stand at the sink and wash his arms, face, and peri area.  He politely declined further bathing an agreed to transition to the therapy gym for remainder of session.  Pt is able to ambulate with close supervision but exhibits decreased ability to stabilize the left hip with stance phase as well as demonstrating increased left lateral flexion.  Once in the gym had pt work on LUE coordination with medium sized ball toss and catch in sitting.  Pt able to complete with 90% success.  Transitioned to tossing a smaller tennis ball from one hand to the other and handing it to himself around his back.  Increased difficulty noted with tossing the ball up in the air with the left hand as well as with catching it.  Pt successful approximately 50% of the time.  Progressed to bouncing basketball with the LUE.  Pt needed increased attempts to complete and use of the RUE to keep it bouncing while attempting to perform this and ambulate back toward his room.  Once in the room had pt finish session by sorting playing cards using the LUE as well.  Encouraged pt to continue working on FM coordination tasks on his own throughout the day.  Pt left in  bedside chair with call button within reach.            Therapy Documentation Precautions:  Precautions Precautions: Fall Precaution Comments: L inattention, poor insight Restrictions Weight Bearing Restrictions: No  Vital Signs: Therapy Vitals BP: (!) 81/61 mmHg Patient Position (if appropriate): Sitting Oxygen Therapy SpO2: 100 % Pulse Oximetry Type: Intermittent Pain: Pain Assessment Pain Assessment: No/denies pain ADL: See FIM for current functional status  Therapy/Group: Individual Therapy  MCGUIRE,JAMES OTR/L 02/09/2015, 3:41 PM

## 2015-02-09 NOTE — Progress Notes (Signed)
54 y.o. 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 Blood sugar- 536, INR 1.02 and UDS positive for cocaine. CT head with early signs of R-MCA infarct and CTA head/neck done revealing evolving anterior division right MCA infarct. Dr. Erlinda Hong recommended resuming Coumadin for embolic stroke due to low EF. He has had complaints of SOB and was treated with dose of IV lasix. patient with resultant left hemiparesis, left inattention, fluctuating bouts of lethargy as well as poorinsight and awareness  Subjective/Complaints: No chest pain or SOB, no other issues Review of Systems - Negative except Left arm weakness Objective: Vital Signs: Blood pressure 98/68, pulse 97, temperature 98.1 F (36.7 C), temperature source Oral, resp. rate 18, height 6' (1.829 m), weight 78.6 kg (173 lb 4.5 oz), SpO2 100 %. No results found. Results for orders placed or performed during the hospital encounter of 02/07/15 (from the past 72 hour(s))  Glucose, capillary     Status: Abnormal   Collection Time: 02/07/15  9:24 PM  Result Value Ref Range   Glucose-Capillary 126 (H) 70 - 99 mg/dL  Comprehensive metabolic panel     Status: Abnormal   Collection Time: 02/08/15  5:12 AM  Result Value Ref Range   Sodium 136 135 - 145 mmol/L   Potassium 4.4 3.5 - 5.1 mmol/L   Chloride 102 96 - 112 mmol/L   CO2 27 19 - 32 mmol/L   Glucose, Bld 204 (H) 70 - 99 mg/dL   BUN 14 6 - 23 mg/dL   Creatinine, Ser 0.91 0.50 - 1.35 mg/dL   Calcium 8.8 8.4 - 10.5 mg/dL   Total Protein 5.5 (L) 6.0 - 8.3 g/dL   Albumin 2.8 (L) 3.5 - 5.2 g/dL   AST 25 0 - 37 U/L   ALT 28 0 - 53 U/L   Alkaline Phosphatase 86 39 - 117 U/L   Total Bilirubin 1.1 0.3 - 1.2 mg/dL   GFR calc non Af Amer >90 >90 mL/min   GFR calc Af Amer >90 >90 mL/min   Comment: (NOTE) The eGFR has been calculated using the CKD EPI equation. This calculation has not been validated in all clinical situations. eGFR's persistently <90 mL/min signify possible Chronic Kidney Disease.    Anion gap 7 5 - 15  CBC     Status: Abnormal   Collection Time: 02/08/15  5:12 AM  Result Value Ref Range   WBC 10.3 4.0 - 10.5 K/uL   RBC 3.26 (L) 4.22 - 5.81 MIL/uL   Hemoglobin 10.3 (L) 13.0 - 17.0 g/dL   HCT 31.5 (L) 39.0 - 52.0 %   MCV 96.6 78.0 - 100.0 fL   MCH 31.6 26.0 - 34.0 pg   MCHC 32.7 30.0 - 36.0 g/dL   RDW 13.9 11.5 - 15.5 %   Platelets 236 150 - 400 K/uL  Digoxin level     Status: Abnormal   Collection Time: 02/08/15  5:12 AM  Result Value Ref Range   Digoxin Level <0.2 (L) 0.8 - 2.0 ng/mL    Comment: RESULTS CONFIRMED BY MANUAL DILUTION  Glucose, capillary     Status: Abnormal   Collection Time: 02/08/15  6:41 AM  Result Value Ref Range   Glucose-Capillary 193 (H) 70 - 99 mg/dL  Glucose, capillary     Status: Abnormal   Collection  Time: 02/08/15 12:27 PM  Result Value Ref Range   Glucose-Capillary 155 (H) 70 - 99 mg/dL   Comment 1 Notify RN   Glucose, capillary     Status: Abnormal   Collection Time: 02/08/15  7:10 PM  Result Value Ref Range   Glucose-Capillary 69 (L) 70 - 99 mg/dL   Comment 1 Notify RN   Glucose, capillary     Status: None   Collection Time: 02/08/15  7:33 PM  Result Value Ref Range   Glucose-Capillary 74 70 - 99 mg/dL  Glucose, capillary     Status: None   Collection Time: 02/08/15  8:53 PM  Result Value Ref Range   Glucose-Capillary 99 70 - 99 mg/dL  Carboxyhemoglobin     Status: Abnormal   Collection Time: 02/09/15  5:05 AM  Result Value Ref Range   Total hemoglobin 10.9 (L) 13.5 - 18.0 g/dL   O2 Saturation 59.6 %   Carboxyhemoglobin 1.9 (H) 0.5 - 1.5 %   Methemoglobin 0.8 0.0 - 1.5 %  Protime-INR     Status: Abnormal   Collection Time: 02/09/15  5:05 AM  Result Value Ref Range   Prothrombin Time 30.0 (H) 11.6 -  15.2 seconds   INR 2.84 (H) 0.00 - 1.49  Glucose, capillary     Status: Abnormal   Collection Time: 02/09/15  6:46 AM  Result Value Ref Range   Glucose-Capillary 188 (H) 70 - 99 mg/dL     HEENT: normal Cardio: RRR Resp: CTA B/L GI: BS positive and NT,ND Extremity:  No Edema Skin:   Intact Neuro: Alert/Oriented and Abnormal Motor 3- LUE and LLE Musc/Skel:  Normal and Other No pain with UE or LE ROM Gen NAD   Assessment/Plan: 1. Functional deficits secondary to Right MCA infarct with Left hemiparesis which require 3+ hours per day of interdisciplinary therapy in a comprehensive inpatient rehab setting. Physiatrist is providing close team supervision and 24 hour management of active medical problems listed below. Physiatrist and rehab team continue to assess barriers to discharge/monitor patient progress toward functional and medical goals. FIM: FIM - Bathing Bathing Steps Patient Completed: Chest, Left Arm, Abdomen, Front perineal area, Buttocks, Right upper leg, Left upper leg, Right lower leg (including foot), Right Arm, Left lower leg (including foot) Bathing: 5: Supervision: Safety issues/verbal cues  FIM - Upper Body Dressing/Undressing Upper body dressing/undressing steps patient completed: Thread/unthread right sleeve of pullover shirt/dresss, Thread/unthread left sleeve of pullover shirt/dress, Put head through opening of pull over shirt/dress, Pull shirt over trunk Upper body dressing/undressing: 5: Supervision: Safety issues/verbal cues FIM - Lower Body Dressing/Undressing Lower body dressing/undressing steps patient completed: Thread/unthread right underwear leg, Pull underwear up/down, Thread/unthread right pants leg, Pull pants up/down, Thread/unthread left underwear leg, Thread/unthread left pants leg, Don/Doff right sock, Don/Doff left sock, Don/Doff left shoe, Fasten/unfasten right shoe, Don/Doff right shoe, Fasten/unfasten left shoe Lower body dressing/undressing: 5:  Supervision: Safety issues/verbal cues        FIM - Bed/Chair Transfer Bed/Chair Transfer: 6: More than reasonable amt of time, 5: Bed > Chair or W/C: Supervision (verbal cues/safety issues)  FIM - Locomotion: Wheelchair Locomotion: Wheelchair: 0: Activity did not occur FIM - Locomotion: Ambulation Ambulation/Gait Assistance: 4: Min guard Locomotion: Ambulation: 4: Travels 150 ft or more with minimal assistance (Pt.>75%)  Comprehension Comprehension Mode: Auditory Comprehension: 5-Understands complex 90% of the time/Cues < 10% of the time  Expression Expression Mode: Verbal Expression: 5-Expresses complex 90% of the time/cues < 10% of the time  Social  Interaction Social Interaction: 5-Interacts appropriately 90% of the time - Needs monitoring or encouragement for participation or interaction.  Problem Solving Problem Solving: 4-Solves basic 75 - 89% of the time/requires cueing 10 - 24% of the time  Memory Memory: 5-Recognizes or recalls 90% of the time/requires cueing < 10% of the time   Medical Problem List and Plan: 1. Functional deficits secondary to Embolic R-MCA infarct 2. DVT Prophylaxis/Anticoagulation: Pharmaceutical: Coumadin 3. Pain Management: Tylenol prn 4. Mood: LCSW to follow for evaluation and support.  5. Neuropsych: This patient is capable of making decisions on her own behalf. 6. Skin/Wound Care: Routine pressure relief measures.  7. Fluids/Electrolytes/Nutrition: Monitor I/O. Check lytes in am. On thin liquids at this time.  8. ICM/NICM/Chronic Systolic CHF: Low salt diet. Monitor daily weights. Continue spironolactone and digoxin daily. Add TED hose to prevent orthostasis 9. DM type 2: Monitor BS with achs checks. Continue lantus 25 units qhs 10. Dyspnea: Encourage IS as well as activity.  LOS (Days) 2 A FACE TO FACE EVALUATION WAS PERFORMED  KIRSTEINS,ANDREW E 02/09/2015, 8:51 AM

## 2015-02-10 ENCOUNTER — Inpatient Hospital Stay (HOSPITAL_COMMUNITY): Payer: Medicare Other | Admitting: Physical Therapy

## 2015-02-10 ENCOUNTER — Encounter (HOSPITAL_COMMUNITY): Payer: Medicare Other | Admitting: Occupational Therapy

## 2015-02-10 ENCOUNTER — Inpatient Hospital Stay (HOSPITAL_COMMUNITY): Payer: Medicare Other | Admitting: Occupational Therapy

## 2015-02-10 ENCOUNTER — Inpatient Hospital Stay (HOSPITAL_COMMUNITY): Payer: Medicare Other | Admitting: Speech Pathology

## 2015-02-10 LAB — GLUCOSE, CAPILLARY
Glucose-Capillary: 159 mg/dL — ABNORMAL HIGH (ref 70–99)
Glucose-Capillary: 90 mg/dL (ref 70–99)
Glucose-Capillary: 198 mg/dL — ABNORMAL HIGH (ref 70–99)
Glucose-Capillary: 123 mg/dL — ABNORMAL HIGH (ref 70–99)

## 2015-02-10 LAB — PROTIME-INR
INR: 2.82 — ABNORMAL HIGH (ref 0.00–1.49)
Prothrombin Time: 29.9 seconds — ABNORMAL HIGH (ref 11.6–15.2)

## 2015-02-10 NOTE — IPOC Note (Signed)
Overall Plan of Care [facility]) Patient Details Name: Dennis [name] MRN: [id number] DOB: 09/01/1961  Admitting Diagnosis: R CVA  CHF   Hospital Problems: Active Problems:   Chronic systolic heart failure   Hemiplegia affecting left nondominant side   Embolic stroke involving right middle cerebral artery   Acute CVA (cerebrovascular accident)     Functional Problem List: Nursing Safety, Skin Integrity, Nutrition  PT Balance, Endurance, Motor, Safety  OT Balance, Cognition, Endurance, Motor  SLP    TR         Basic ADL's: OT Eating, Grooming, Bathing, Dressing, Toileting     Advanced  ADL's: OT Simple Meal Preparation     Transfers: PT Bed to Chair, Car, Floor  OT Toilet, Tub/Shower     Locomotion: PT Ambulation, Stairs     Additional Impairments: OT Fuctional Use of Upper Extremity  SLP        TR      Anticipated Outcomes Item Anticipated Outcome  Self Feeding independent  Swallowing      Basic self-care  modified independent  Toileting  modified independent   Bathroom Transfers modified independent  Bowel/Bladder  Mod I  Transfers  Mod I  Locomotion  Mod I household; supervision community  Communication     Cognition  Mod I   Pain  <3  Safety/Judgment  Supervision   Therapy Plan: PT Intensity: Minimum of 1-2 x/day ,45 to 90 minutes PT Frequency: 5 out of 7 days PT Duration Estimated Length of Stay: 5 to 7 days OT Intensity: Minimum of 1-2 x/day, 45 to 90 minutes OT Frequency: 5 out of 7 days OT Duration/Estimated Length of Stay: 5-7 days SLP Intensity: Minumum of 1-2 x/day, 30 to 90 minutes SLP Frequency: 1 to 3 out of 7 days SLP Duration/Estimated Length of Stay: 7 days        Team Interventions: Nursing Interventions Patient/Family Education, Disease Management/Prevention, Skin Care/Wound Management, Bowel Management  PT interventions Ambulation/gait training, Training and development officer, Community reintegration, Discharge planning,  Functional mobility training, Patient/family education, IT trainer, Therapeutic Activities, UE/LE Strength taining/ROM, Therapeutic Exercise, UE/LE Coordination activities  OT Interventions Training and development officer, Cognitive remediation/compensation, Community reintegration, Discharge planning, Functional mobility training, Psychosocial support, Therapeutic Activities, UE/LE Coordination activities, Patient/family education, DME/adaptive equipment instruction, UE/LE Strength taining/ROM, Therapeutic Exercise, Self Care/advanced ADL retraining, Neuromuscular re-education  SLP Interventions Cognitive remediation/compensation, Cueing hierarchy, Dysphagia/aspiration precaution training, Environmental controls, Functional tasks, Internal/external aids, Patient/family education, Therapeutic Activities  TR Interventions    SW/CM Interventions Discharge Planning, Psychosocial Support, Patient/Family Education    Team Discharge Planning: Destination: PT-Home ,OT- Home , SLP-Home Projected Follow-up: PT-Outpatient PT, OT-  Outpatient OT, SLP-None Projected Equipment Needs: PT-To be determined, OT- To be determined, SLP-None recommended by SLP Equipment Details: PT- , OT-  Patient/family involved in discharge planning: PT- Patient,  OT-Patient, SLP-Patient  MD ELOS: 7-10d Medical Rehab Prognosis:  Good Assessment: 54 y.o. 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 Blood sugar- 536, INR 1.02 and UDS positive for cocaine. CT head with early signs of R-MCA infarct and CTA head/neck done revealing evolving anterior division right MCA infarct  Pt had exacerbation during first CIR stay, transferred back to acute care now back to complete rehab program  Now requiring 24/7 Rehab RN,MD, as well as  CIR level PT, OT and SLP.  Treatment  team will focus on ADLs and mobility with goals set at Mod I  See Team Conference Notes for weekly updates to the plan of care

## 2015-02-10 NOTE — Progress Notes (Signed)
ANTICOAGULATION CONSULT NOTE - Follow Up Consult  Pharmacy Consult for warfarin Indication: LV noncompaction  No Known Allergies  Patient Measurements: Height: 6' (182.9 cm) Weight: 152 lb 1.9 oz (69 kg) (Standing scale used) IBW/kg (Calculated) : 77.6  Vital Signs: Temp: 98.4 F (36.9 C) (04/30 0515) Temp Source: Oral (04/30 0515) BP: 95/69 mmHg (04/30 0515) Pulse Rate: 90 (04/30 0515)  Labs:  Recent Labs  02/08/15 0512 02/09/15 0505 02/10/15 0607  HGB 10.3*  --   --   HCT 31.5*  --   --   PLT 236  --   --   LABPROT  --  30.0* 29.9*  INR  --  2.84* 2.82*  CREATININE 0.91  --   --     Estimated Creatinine Clearance: 92.7 mL/min (by C-G formula based on Cr of 0.91).   Medications:  Scheduled:  . atorvastatin  20 mg Oral q1800  . digoxin  0.25 mg Oral Daily  . feeding supplement (GLUCERNA SHAKE)  237 mL Oral BID BM  . hydrocerin   Topical BID  . insulin aspart  0-15 Units Subcutaneous TID WC  . insulin aspart  0-5 Units Subcutaneous QHS  . insulin aspart  4 Units Subcutaneous TID WC  . insulin glargine  35 Units Subcutaneous QHS  . ivabradine  7.5 mg Oral BID WC  . senna-docusate  2 tablet Oral BID  . sodium chloride  10-40 mL Intracatheter Q12H  . spironolactone  12.5 mg Oral Daily  . warfarin  7.5 mg Oral q1800  . Warfarin - Pharmacist Dosing Inpatient   Does not apply q1800    Assessment: 53yoM w/ left ventricular noncompation currently on therapeutic coumadin.  No bleeding noted. Last CBC stable.   AC: Warfarin PTA for hx L ventricular noncompaction -INR 2.8, no bleeding; last CBC 4/21 stable, ASA stopped per neuro rec since INR >2 Last anticoag note 12/01/14>>dose 7.'5mg'$  daily (med rec says '10mg'$  daily?) INR today remains stable  Plan:   Coumadin 7.5 mg po daily INR mwf  Thank you for allowing pharmacy to be part of this patient's care team Excell Seltzer, PharmD  02/10/2015 8:33 AM

## 2015-02-10 NOTE — Progress Notes (Signed)
54 y.o. 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 Blood sugar- 536, INR 1.02 and UDS positive for cocaine. CT head with early signs of R-MCA infarct and CTA head/neck done revealing evolving anterior division right MCA infarct. Dr. Erlinda Hong recommended resuming Coumadin for embolic stroke due to low EF. He has had complaints of SOB and was treated with dose of IV lasix. patient with resultant left hemiparesis, left inattention, fluctuating bouts of lethargy as well as poor insight and awareness  Subjective/Complaints: Appreciate cardiology note, patient without chest pains or shortness of breath, tolerating therapy well Review of Systems - Negative except Left arm weakness Objective: Vital Signs: Blood pressure 95/69, pulse 90, temperature 98.4 F (36.9 C), temperature source Oral, resp. rate 20, height 6' (1.829 m), weight 69 kg (152 lb 1.9 oz), SpO2 100 %. No results found. Results for orders placed or performed during the hospital encounter of 02/07/15 (from the past 72 hour(s))  Glucose, capillary     Status: Abnormal   Collection Time: 02/07/15  9:24 PM  Result Value Ref Range   Glucose-Capillary 126 (H) 70 - 99 mg/dL  Comprehensive metabolic panel     Status: Abnormal   Collection Time: 02/08/15  5:12 AM  Result Value Ref Range   Sodium 136 135 - 145 mmol/L   Potassium 4.4 3.5 - 5.1 mmol/L   Chloride 102 96 - 112 mmol/L   CO2 27 19 - 32 mmol/L   Glucose, Bld 204 (H) 70 - 99 mg/dL   BUN 14 6 - 23 mg/dL   Creatinine, Ser 0.91 0.50 - 1.35 mg/dL   Calcium 8.8 8.4 - 10.5 mg/dL   Total Protein 5.5 (L) 6.0 - 8.3 g/dL   Albumin 2.8 (L) 3.5 - 5.2 g/dL   AST 25 0 - 37 U/L   ALT 28 0 - 53 U/L   Alkaline Phosphatase 86 39 - 117 U/L   Total Bilirubin 1.1 0.3 - 1.2 mg/dL   GFR calc  non Af Amer >90 >90 mL/min   GFR calc Af Amer >90 >90 mL/min    Comment: (NOTE) The eGFR has been calculated using the CKD EPI equation. This calculation has not been validated in all clinical situations. eGFR's persistently <90 mL/min signify possible Chronic Kidney Disease.    Anion gap 7 5 - 15  CBC     Status: Abnormal   Collection Time: 02/08/15  5:12 AM  Result Value Ref Range   WBC 10.3 4.0 - 10.5 K/uL   RBC 3.26 (L) 4.22 - 5.81 MIL/uL   Hemoglobin 10.3 (L) 13.0 - 17.0 g/dL   HCT 31.5 (L) 39.0 - 52.0 %   MCV 96.6 78.0 - 100.0 fL   MCH 31.6 26.0 - 34.0 pg   MCHC 32.7 30.0 - 36.0 g/dL   RDW 13.9 11.5 - 15.5 %   Platelets 236 150 - 400 K/uL  Digoxin level     Status: Abnormal   Collection Time: 02/08/15  5:12 AM  Result Value Ref Range   Digoxin Level <0.2 (L) 0.8 - 2.0 ng/mL    Comment: RESULTS CONFIRMED BY MANUAL DILUTION  Glucose, capillary     Status: Abnormal   Collection Time: 02/08/15  6:41 AM  Result Value Ref Range   Glucose-Capillary 193 (H) 70 - 99 mg/dL  Glucose, capillary  Status: Abnormal   Collection Time: 02/08/15 12:27 PM  Result Value Ref Range   Glucose-Capillary 155 (H) 70 - 99 mg/dL   Comment 1 Notify RN   Glucose, capillary     Status: Abnormal   Collection Time: 02/08/15  7:10 PM  Result Value Ref Range   Glucose-Capillary 69 (L) 70 - 99 mg/dL   Comment 1 Notify RN   Glucose, capillary     Status: None   Collection Time: 02/08/15  7:33 PM  Result Value Ref Range   Glucose-Capillary 74 70 - 99 mg/dL  Glucose, capillary     Status: None   Collection Time: 02/08/15  8:53 PM  Result Value Ref Range   Glucose-Capillary 99 70 - 99 mg/dL  Carboxyhemoglobin     Status: Abnormal   Collection Time: 02/09/15  5:05 AM  Result Value Ref Range   Total hemoglobin 10.9 (L) 13.5 - 18.0 g/dL   O2 Saturation 59.6 %   Carboxyhemoglobin 1.9 (H) 0.5 - 1.5 %   Methemoglobin 0.8 0.0 - 1.5 %  Protime-INR     Status: Abnormal   Collection Time: 02/09/15   5:05 AM  Result Value Ref Range   Prothrombin Time 30.0 (H) 11.6 - 15.2 seconds   INR 2.84 (H) 0.00 - 1.49  Glucose, capillary     Status: Abnormal   Collection Time: 02/09/15  6:46 AM  Result Value Ref Range   Glucose-Capillary 188 (H) 70 - 99 mg/dL  Glucose, capillary     Status: Abnormal   Collection Time: 02/09/15  9:47 AM  Result Value Ref Range   Glucose-Capillary 246 (H) 70 - 99 mg/dL  Glucose, capillary     Status: Abnormal   Collection Time: 02/09/15 12:01 PM  Result Value Ref Range   Glucose-Capillary 118 (H) 70 - 99 mg/dL  Glucose, capillary     Status: Abnormal   Collection Time: 02/09/15  4:56 PM  Result Value Ref Range   Glucose-Capillary 103 (H) 70 - 99 mg/dL  Glucose, capillary     Status: Abnormal   Collection Time: 02/09/15  8:44 PM  Result Value Ref Range   Glucose-Capillary 161 (H) 70 - 99 mg/dL   Comment 1 Notify RN   Protime-INR     Status: Abnormal   Collection Time: 02/10/15  6:07 AM  Result Value Ref Range   Prothrombin Time 29.9 (H) 11.6 - 15.2 seconds   INR 2.82 (H) 0.00 - 1.49  Glucose, capillary     Status: Abnormal   Collection Time: 02/10/15  6:23 AM  Result Value Ref Range   Glucose-Capillary 159 (H) 70 - 99 mg/dL   Comment 1 Notify RN      HEENT: normal Cardio: RRR Resp: CTA B/L GI: BS positive and NT,ND Extremity:  No Edema Skin:   Intact Neuro: Alert/Oriented and Abnormal Motor 3- LUE and LLE Musc/Skel:  Normal and Other No pain with UE or LE ROM Gen NAD   Assessment/Plan: 1. Functional deficits secondary to Right MCA infarct with Left hemiparesis which require 3+ hours per day of interdisciplinary therapy in a comprehensive inpatient rehab setting. Physiatrist is providing close team supervision and 24 hour management of active medical problems listed below. Physiatrist and rehab team continue to assess barriers to discharge/monitor patient progress toward functional and medical goals. FIM: FIM - Bathing Bathing Steps Patient  Completed: Right Arm, Left Arm, Buttocks, Front perineal area, Chest, Right upper leg, Left upper leg, Right lower leg (including foot), Left lower  leg (including foot), Abdomen Bathing: 5: Supervision: Safety issues/verbal cues  FIM - Upper Body Dressing/Undressing Upper body dressing/undressing steps patient completed: Thread/unthread right sleeve of pullover shirt/dresss, Thread/unthread left sleeve of pullover shirt/dress, Put head through opening of pull over shirt/dress, Pull shirt over trunk Upper body dressing/undressing: 5: Supervision: Safety issues/verbal cues FIM - Lower Body Dressing/Undressing Lower body dressing/undressing steps patient completed: Thread/unthread right underwear leg, Pull underwear up/down, Thread/unthread right pants leg, Pull pants up/down, Thread/unthread left underwear leg, Thread/unthread left pants leg, Don/Doff right sock, Don/Doff left sock, Don/Doff left shoe, Fasten/unfasten right shoe, Don/Doff right shoe, Fasten/unfasten left shoe Lower body dressing/undressing: 5: Supervision: Safety issues/verbal cues        FIM - Control and instrumentation engineer Devices: Arm rests Bed/Chair Transfer: 6: Bed > Chair or W/C: No assist, 7: Supine > Sit: No assist  FIM - Locomotion: Wheelchair Locomotion: Wheelchair: 0: Activity did not occur FIM - Locomotion: Ambulation Ambulation/Gait Assistance: 5: Supervision Locomotion: Ambulation: 5: Travels 150 ft or more with supervision/safety issues  Comprehension Comprehension Mode: Auditory Comprehension: 5-Understands complex 90% of the time/Cues < 10% of the time  Expression Expression Mode: Verbal Expression: 5-Expresses complex 90% of the time/cues < 10% of the time  Social Interaction Social Interaction: 4-Interacts appropriately 75 - 89% of the time - Needs redirection for appropriate language or to initiate interaction.  Problem Solving Problem Solving: 4-Solves basic 75 - 89% of the  time/requires cueing 10 - 24% of the time  Memory Memory: 4-Recognizes or recalls 75 - 89% of the time/requires cueing 10 - 24% of the time   Medical Problem List and Plan: 1. Functional deficits secondary to Embolic R-MCA infarct 2. DVT Prophylaxis/Anticoagulation: Pharmaceutical: Coumadin 3. Pain Management: Tylenol prn 4. Mood: LCSW to follow for evaluation and support.  5. Neuropsych: This patient is capable of making decisions on her own behalf. 6. Skin/Wound Care: Routine pressure relief measures.  7. Fluids/Electrolytes/Nutrition: Monitor I/O. Check lytes in am. On thin liquids at this time.  8. ICM/NICM/Chronic Systolic CHF: Low salt diet. Monitor daily weights. Continue spironolactone and digoxin daily. Add TED hose to prevent orthostasis 9. DM type 2: Monitor BS with achs checks. Continue lantus 25 units qhs 10. Dyspnea: Encourage IS as well as activity.  LOS (Days) 3 A FACE TO FACE EVALUATION WAS PERFORMED  KIRSTEINS,ANDREW E 02/10/2015, 11:02 AM

## 2015-02-10 NOTE — Progress Notes (Signed)
Occupational Therapy Session Note  Patient Details  Name: Dennis [name] MRN: [id number] Date of Birth: Jul 02, 1961  Today's Date: 02/10/2015 OT Individual Time:  - 1345-1415  (30 min)      Short Term Goals: Week 1:  OT Short Term Goal 1 (Week 1): STGs equal to LTGs based on ELOS of 1 week.  Skilled Therapeutic Interventions/Progress Updates:    Engaged in functional mobiltiy to day room.  Did lunges across the room with min assist for technique.  Did brain teazers games  using LUE to place pegs.   Ambulated back to room and left with all needs in reach.   Therapy Documentation Precautions:  Precautions Precautions: Fall Precaution Comments: L inattention, poor insight Restrictions Weight Bearing Restrictions: No    Pain:  none             See FIM for current functional status  Therapy/Group: Individual Therapy  Lisa Roca 02/10/2015, 8:23 AM

## 2015-02-10 NOTE — Progress Notes (Signed)
Occupational Therapy Session Note  Patient Details  Name: Sharon Stapel MRN: [id number] Date of Birth: April 16, 1961  Today's Date: 02/10/2015 OT Individual Time: 0800-0900 OT Individual Time Calculation (min): 60 min    Short Term Goals: Week 1:  OT Short Term Goal 1 (Week 1): STGs equal to LTGs based on ELOS of 1 week.  Skilled Therapeutic Interventions/Progress Updates:  Upon entering the room, pt seated on EOB finishing breakfast. Pt with no c/o pain and requesting to wash in shower this session. BP taken while seated with results being within parameters set by MD and 92/60. Pt did not report any sings or symptoms of hypotension during session and reported he was feeling well. Pt ambulated in room with supervision to obtain clothing, towels, and all needed items for bathing tasks. Pt performed bathing while standing with supervision for safety. Pt cleaned up bathroom and ambulated back to room and seated at recliner chair for dressing. Pt performing all grooming tasks while standing at sink as well with supervision. Pt seated in wheelchair with call bell and all needed items within reach upon exiting the room.   Therapy Documentation Precautions:  Precautions Precautions: Fall Precaution Comments: L inattention, poor insight Restrictions Weight Bearing Restrictions: No Pain: Pain Assessment Pain Assessment: 0-10  See FIM for current functional status  Therapy/Group: Individual Therapy  Phineas Semen 02/10/2015, 12:28 PM

## 2015-02-10 NOTE — Progress Notes (Signed)
Physical Therapy Session Note  Patient Details  Name: Dennis [name] MRN: [id number] Date of Birth: [birth year]  Today's Date: 02/10/2015 PT Individual Time: 1000-1100 PT Individual Time Calculation (min): 60 min   Short Term Goals: Week 1:  PT Short Term Goal 1 (Week 1): STG's = LTG's due to ELOS   Skilled Therapeutic Interventions/Progress Updates:    Pt with some episodes of impulsivity during session benefiting from education and cueing to increase safety. Pt able to tolerate activities with rest breaks, but increased rest breaks required for higher intensity activities. Pt needs higher intensity activities for fall prevention and recovery to increase safety at discharge. Pt would continue to benefit from individualized skilled PT services to increase functional mobility.   Therapy Documentation Precautions:  Precautions Precautions: Fall Precaution Comments: L inattention, poor insight Restrictions Weight Bearing Restrictions: No Pain: Pain Assessment Pain Assessment: No/denies pain Mobility:  Pt performs transfer Mod I Locomotion : Ambulation Ambulation/Gait Assistance: 5: Supervision with cues for safety and attention 150'x3 Other Treatments:  Pt educated on rehab plan, safety in mobility, pacing, ventilation, and fall recovery. Pt performs standing balance 1'x3. Pt performs B/L partial lunges 2x5, BUE power pulls from mid level position 2x5, heel raises 2x10, and ball toss vs wall 2x10. Pt performs fall recovery on mat with cues for techniques. 1 trial of gait with increased velocity and 1 trial of gait with scap retraction. Step-ups x10 without AD. Frequent rest breaks provided to manage fatigue.  See FIM for current functional status  Therapy/Group: Individual Therapy  Monia Pouch 02/10/2015, 4:32 PM

## 2015-02-10 NOTE — Progress Notes (Signed)
Speech Language Pathology Daily Session Note  Patient Details  Name: Dennis [name] MRN: [id number] Date of Birth: 1961/03/24  Today's Date: 02/10/2015 SLP Individual Time: 0160-1093 SLP Individual Time Calculation (min): 45 min  Short Term Goals: Week 1: SLP Short Term Goal 1 (Week 1): Pt will complete complex problem solving during functional tasks with mod I over 2 consecutive sessions.  SLP Short Term Goal 2 (Week 1): Pt will recall complex information with mod I use of compensatory aids.   SLP Short Term Goal 3 (Week 1): Pt will recognize and correct errors during semi-complex tasks with mod I  Skilled Therapeutic Interventions: . Patient admitted to [facility] inpatient rehabilitation 02/09/2015 and demonstrates moderate cognitive impairments impacting orientation, comprehension, executive function, awareness, attention, problem solving, etc which impacts the patient's overall safety with functional tasks.  Patient also demonstrates mild-moderate dysphagia and current on Dys 3 and nectar thick liquids by cup only.  Patient would benefit from skilled SLP intervention to maximize her swallow and cognition skills, in order to maximize her functional independence prior to discharge.  Anticipate patient will require 24 hour supervision at home and follow up SLP services.   FIM:  Comprehension Comprehension Mode: Auditory Comprehension: 6-Follows complex conversation/direction: With extra time/assistive device Expression Expression Mode: Verbal Expression: 5-Expresses complex 90% of the time/cues < 10% of the time Social Interaction Social Interaction: 6-Interacts appropriately with others with medication or extra time (anti-anxiety, antidepressant). Problem Solving Problem Solving: 5-Solves complex 90% of the time/cues < 10% of the time Memory Memory: 6-More than reasonable amt of time  Pain Pain Assessment Pain Assessment: No/denies pain Pain Score: 0-No pain  Therapy/Group: Individual  Therapy  Annabell Howells, MA CCC-SLP Annabell Howells A 02/10/2015, 6:35 PM

## 2015-02-11 ENCOUNTER — Inpatient Hospital Stay (HOSPITAL_COMMUNITY): Payer: Medicare Other | Admitting: Physical Therapy

## 2015-02-11 LAB — CBC
HCT: 32.6 % — ABNORMAL LOW (ref 39.0–52.0)
Hemoglobin: 10.5 g/dL — ABNORMAL LOW (ref 13.0–17.0)
MCH: 31.2 pg (ref 26.0–34.0)
MCHC: 32.2 g/dL (ref 30.0–36.0)
MCV: 96.7 fL (ref 78.0–100.0)
Platelets: 251 10*3/uL (ref 150–400)
RBC: 3.37 MIL/uL — ABNORMAL LOW (ref 4.22–5.81)
RDW: 14.3 % (ref 11.5–15.5)
WBC: 8.9 10*3/uL (ref 4.0–10.5)

## 2015-02-11 LAB — GLUCOSE, CAPILLARY
Glucose-Capillary: 164 mg/dL — ABNORMAL HIGH (ref 70–99)
Glucose-Capillary: 76 mg/dL (ref 70–99)
Glucose-Capillary: 191 mg/dL — ABNORMAL HIGH (ref 70–99)
Glucose-Capillary: 149 mg/dL — ABNORMAL HIGH (ref 70–99)

## 2015-02-11 MED ORDER — MAGNESIUM CITRATE PO SOLN
1.0000 | Freq: Once | ORAL | Status: AC
  Administered 2015-02-11: 1 via ORAL
  Filled 2015-02-11: qty 296

## 2015-02-11 NOTE — Progress Notes (Signed)
54 y.o. 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 Blood sugar- 536, INR 1.02 and UDS positive for cocaine. CT head with early signs of R-MCA infarct and CTA head/neck done revealing evolving anterior division right MCA infarct. Dr. Erlinda Hong recommended resuming Coumadin for embolic stroke due to low EF.  Subjective/Complaints: Pt tolerated therapy well yesterday, no new issues overnite, No CP or SOB Review of Systems - Negative except Left arm weakness Objective: Vital Signs: Blood pressure 92/60, pulse 112, temperature 97.7 F (36.5 C), temperature source Oral, resp. rate 18, height 6' (1.829 m), weight 62.9 kg (138 lb 10.7 oz), SpO2 95 %. No results found. Results for orders placed or performed during the hospital encounter of 02/07/15 (from the past 72 hour(s))  Glucose, capillary     Status: Abnormal   Collection Time: 02/08/15 12:27 PM  Result Value Ref Range   Glucose-Capillary 155 (H) 70 - 99 mg/dL   Comment 1 Notify RN   Glucose, capillary     Status: Abnormal   Collection Time: 02/08/15  7:10 PM  Result Value Ref Range   Glucose-Capillary 69 (L) 70 - 99 mg/dL   Comment 1 Notify RN   Glucose, capillary     Status: None   Collection Time: 02/08/15  7:33 PM  Result Value Ref Range   Glucose-Capillary 74 70 - 99 mg/dL  Glucose, capillary     Status: None   Collection Time: 02/08/15  8:53 PM  Result Value Ref Range   Glucose-Capillary 99 70 - 99 mg/dL  Carboxyhemoglobin     Status: Abnormal   Collection Time: 02/09/15  5:05 AM  Result Value Ref Range   Total hemoglobin 10.9 (L) 13.5 - 18.0 g/dL   O2 Saturation 59.6 %   Carboxyhemoglobin 1.9 (H) 0.5 - 1.5 %   Methemoglobin 0.8 0.0 - 1.5 %  Protime-INR     Status: Abnormal   Collection Time: 02/09/15  5:05 AM  Result  Value Ref Range   Prothrombin Time 30.0 (H) 11.6 - 15.2 seconds   INR 2.84 (H) 0.00 - 1.49  Glucose, capillary     Status: Abnormal   Collection Time: 02/09/15  6:46 AM  Result Value Ref Range   Glucose-Capillary 188 (H) 70 - 99 mg/dL  Glucose, capillary     Status: Abnormal   Collection Time: 02/09/15  9:47 AM  Result Value Ref Range   Glucose-Capillary 246 (H) 70 - 99 mg/dL  Glucose, capillary     Status: Abnormal   Collection Time: 02/09/15 12:01 PM  Result Value Ref Range   Glucose-Capillary 118 (H) 70 - 99 mg/dL  Glucose, capillary     Status: Abnormal   Collection Time: 02/09/15  4:56 PM  Result Value Ref Range   Glucose-Capillary 103 (H) 70 - 99 mg/dL  Glucose, capillary     Status: Abnormal   Collection Time: 02/09/15  8:44 PM  Result Value Ref Range   Glucose-Capillary 161 (H) 70 - 99 mg/dL   Comment 1 Notify RN   Protime-INR     Status: Abnormal   Collection Time: 02/10/15  6:07 AM  Result Value Ref Range   Prothrombin Time 29.9 (H) 11.6 - 15.2 seconds   INR 2.82 (H) 0.00 - 1.49  Glucose, capillary     Status: Abnormal   Collection  Time: 02/10/15  6:23 AM  Result Value Ref Range   Glucose-Capillary 159 (H) 70 - 99 mg/dL   Comment 1 Notify RN   Glucose, capillary     Status: None   Collection Time: 02/10/15 11:42 AM  Result Value Ref Range   Glucose-Capillary 90 70 - 99 mg/dL  Glucose, capillary     Status: Abnormal   Collection Time: 02/10/15  4:57 PM  Result Value Ref Range   Glucose-Capillary 198 (H) 70 - 99 mg/dL  Glucose, capillary     Status: Abnormal   Collection Time: 02/10/15  8:45 PM  Result Value Ref Range   Glucose-Capillary 123 (H) 70 - 99 mg/dL   Comment 1 Notify RN   CBC     Status: Abnormal   Collection Time: 02/11/15  4:49 AM  Result Value Ref Range   WBC 8.9 4.0 - 10.5 K/uL   RBC 3.37 (L) 4.22 - 5.81 MIL/uL   Hemoglobin 10.5 (L) 13.0 - 17.0 g/dL   HCT 32.6 (L) 39.0 - 52.0 %   MCV 96.7 78.0 - 100.0 fL   MCH 31.2 26.0 - 34.0 pg   MCHC  32.2 30.0 - 36.0 g/dL   RDW 14.3 11.5 - 15.5 %   Platelets 251 150 - 400 K/uL  Glucose, capillary     Status: Abnormal   Collection Time: 02/11/15  6:25 AM  Result Value Ref Range   Glucose-Capillary 164 (H) 70 - 99 mg/dL   Comment 1 Notify RN      HEENT: normal Cardio: RRR Resp: CTA B/L GI: BS positive and NT,ND Extremity:  No Edema Skin:   Intact Neuro: Alert/Oriented and Abnormal Motor 3- LUE and LLE Musc/Skel:  Normal and Other No pain with UE or LE ROM Gen NAD   Assessment/Plan: 1. Functional deficits secondary to Right MCA infarct with Left hemiparesis which require 3+ hours per day of interdisciplinary therapy in a comprehensive inpatient rehab setting. Physiatrist is providing close team supervision and 24 hour management of active medical problems listed below. Physiatrist and rehab team continue to assess barriers to discharge/monitor patient progress toward functional and medical goals. FIM: FIM - Bathing Bathing Steps Patient Completed: Right Arm, Left Arm, Buttocks, Front perineal area, Chest, Right upper leg, Left upper leg, Right lower leg (including foot), Left lower leg (including foot), Abdomen Bathing: 5: Supervision: Safety issues/verbal cues  FIM - Upper Body Dressing/Undressing Upper body dressing/undressing steps patient completed: Thread/unthread right sleeve of pullover shirt/dresss, Thread/unthread left sleeve of pullover shirt/dress, Put head through opening of pull over shirt/dress, Pull shirt over trunk Upper body dressing/undressing: 5: Supervision: Safety issues/verbal cues FIM - Lower Body Dressing/Undressing Lower body dressing/undressing steps patient completed: Thread/unthread right underwear leg, Pull underwear up/down, Thread/unthread right pants leg, Pull pants up/down, Thread/unthread left underwear leg, Thread/unthread left pants leg, Don/Doff right sock, Don/Doff left sock, Don/Doff left shoe, Fasten/unfasten right shoe, Don/Doff right shoe,  Fasten/unfasten left shoe Lower body dressing/undressing: 5: Supervision: Safety issues/verbal cues        FIM - Control and instrumentation engineer Devices: Arm rests Bed/Chair Transfer: 6: Bed > Chair or W/C: No assist, 6: Chair or W/C > Bed: No assist  FIM - Locomotion: Wheelchair Locomotion: Wheelchair: 0: Activity did not occur FIM - Locomotion: Ambulation Ambulation/Gait Assistance: 5: Supervision Locomotion: Ambulation: 5: Travels 150 ft or more with supervision/safety issues  Comprehension Comprehension Mode: Auditory Comprehension: 5-Follows basic conversation/direction: With no assist  Expression Expression Mode: Verbal Expression: 5-Expresses complex 90% of  the time/cues < 10% of the time  Social Interaction Social Interaction: 6-Interacts appropriately with others with medication or extra time (anti-anxiety, antidepressant).  Problem Solving Problem Solving: 5-Solves basic problems: With no assist  Memory Memory: 5-Requires cues to use assistive device   Medical Problem List and Plan: 1. Functional deficits secondary to Embolic R-MCA infarct 2. DVT Prophylaxis/Anticoagulation: Pharmaceutical: Coumadin 3. Pain Management: Tylenol prn 4. Mood: LCSW to follow for evaluation and support.  5. Neuropsych: This patient is capable of making decisions on her own behalf. 6. Skin/Wound Care: Routine pressure relief measures.  7. Fluids/Electrolytes/Nutrition: Monitor I/O. Check lytes in am. On thin liquids at this time.  8. ICM/NICM/Chronic Systolic CHF: Low salt diet. Monitor daily weights. Continue spironolactone and digoxin daily. Add TED hose to prevent orthostasis 9. DM type 2: Monitor BS with achs checks. Continue lantus 25 units qhs 10. Dyspnea: Encourage IS as well as activity.  LOS (Days) 4 A FACE TO FACE EVALUATION WAS PERFORMED  KIRSTEINS,ANDREW E 02/11/2015, 10:31 AM

## 2015-02-11 NOTE — Progress Notes (Signed)
Physical Therapy Session Note  Patient Details  Name: Dennis [name] MRN: [id number] Date of Birth: 1961-04-19  Today's Date: 02/11/2015 PT Individual Time: 3299-2426 PT Individual Time Calculation (min): 40 min   Short Term Goals: Week 1:  PT Short Term Goal 1 (Week 1): STG's = LTG's due to ELOS  Skilled Therapeutic Interventions/Progress Updates:    Pt with most difficulty perform dual motor and cog tasks in session requiring cues for safety. Pt still with limited ECC control of knee in gait with decreased knee flexion in stance. Pt would continue to benefit from skilled PT services to increase functional mobility.  Therapy Documentation Precautions:  Precautions Precautions: Fall Precaution Comments: L inattention, poor insight Restrictions Weight Bearing Restrictions: No General: PT Amount of Missed Time (min): 5 Minutes PT Missed Treatment Reason:  (Pt defers for eating) Vital Signs: Therapy Vitals Temp: 97.7 F (36.5 C) Temp Source: Oral Pulse Rate: (!) 112 Resp: 18 BP: 92/60 mmHg Patient Position (if appropriate): Lying Oxygen Therapy SpO2: 95 % O2 Device: Not Delivered Pain: Pain Assessment Pain Assessment: No/denies pain Mobility:   Locomotion : Ambulation Ambulation/Gait Assistance: 5: Supervision  Other Treatments:  Pt educated on rehab plan and safety in mobility. Pt performs transfers x15 in session. Pt performs dynamic gait including obstacle negotiation, carrying objects, and dual cog tasks/dual motor tasks. Pt performs toe taps on flat surfaces and cones with dual cog tasks. Standing balance on double foam with EO and EC 30" each and head turns, head nods, and sidebends 2x10. SLS 10"x2 with and without EO/EC intermittently.  See FIM for current functional status  Therapy/Group: Individual Therapy  Monia Pouch 02/11/2015, 8:39 AM

## 2015-02-12 ENCOUNTER — Ambulatory Visit (HOSPITAL_COMMUNITY): Payer: Self-pay

## 2015-02-12 ENCOUNTER — Inpatient Hospital Stay (HOSPITAL_COMMUNITY): Payer: Medicare Other | Admitting: Speech Pathology

## 2015-02-12 ENCOUNTER — Inpatient Hospital Stay (HOSPITAL_COMMUNITY): Payer: Medicare Other

## 2015-02-12 ENCOUNTER — Inpatient Hospital Stay (HOSPITAL_COMMUNITY): Payer: Medicare Other | Admitting: Occupational Therapy

## 2015-02-12 ENCOUNTER — Inpatient Hospital Stay (HOSPITAL_COMMUNITY): Payer: Medicare Other | Admitting: Rehabilitation

## 2015-02-12 DIAGNOSIS — E1165 Type 2 diabetes mellitus with hyperglycemia: Secondary | ICD-10-CM

## 2015-02-12 LAB — GLUCOSE, CAPILLARY
Glucose-Capillary: 67 mg/dL — ABNORMAL LOW (ref 70–99)
Glucose-Capillary: 74 mg/dL (ref 70–99)
Glucose-Capillary: 181 mg/dL — ABNORMAL HIGH (ref 70–99)
Glucose-Capillary: 89 mg/dL (ref 70–99)
Glucose-Capillary: 178 mg/dL — ABNORMAL HIGH (ref 70–99)

## 2015-02-12 LAB — BASIC METABOLIC PANEL
Anion gap: 5 (ref 5–15)
BUN: 17 mg/dL (ref 6–20)
CO2: 29 mmol/L (ref 22–32)
Calcium: 9.1 mg/dL (ref 8.9–10.3)
Chloride: 105 mmol/L (ref 101–111)
Creatinine, Ser: 0.94 mg/dL (ref 0.61–1.24)
GFR calc Af Amer: >60 mL/min (ref >60)
GFR calc non Af Amer: >60 mL/min (ref >60)
Glucose, Bld: 66 mg/dL — ABNORMAL LOW (ref 70–99)
Potassium: 4.1 mmol/L (ref 3.5–5.1)
Sodium: 139 mmol/L (ref 135–145)

## 2015-02-12 LAB — PROTIME-INR
INR: 3.08 — ABNORMAL HIGH (ref 0.00–1.49)
Prothrombin Time: 32 seconds — ABNORMAL HIGH (ref 11.6–15.2)

## 2015-02-12 MED ORDER — INSULIN GLARGINE 100 UNIT/ML ~~LOC~~ SOLN
30.0000 [IU] | Freq: Every day | SUBCUTANEOUS | Status: DC
  Administered 2015-02-12 – 2015-02-13 (×2): 30 [IU] via SUBCUTANEOUS
  Filled 2015-02-12 (×3): qty 0.3

## 2015-02-12 MED ORDER — WARFARIN SODIUM 5 MG PO TABS
5.0000 mg | ORAL_TABLET | Freq: Once | ORAL | Status: AC
  Administered 2015-02-12: 5 mg via ORAL
  Filled 2015-02-12: qty 1

## 2015-02-12 NOTE — Progress Notes (Signed)
Occupational Therapy Session Note  Patient Details  Name: Dennis [name] MRN: [id number] Date of Birth: 1961-04-09  Today's Date: 02/12/2015 OT Individual Time: 0272-5366 OT Individual Time Calculation (min): 28 min    Short Term Goals: Week 1:  OT Short Term Goal 1 (Week 1): STGs equal to LTGs based on ELOS of 1 week.  Skilled Therapeutic Interventions/Progress Updates:    Pt performed static standing balance while engaged in LUE functional use to deal and sort cards.  He was able to maintain selective attention in moderately distracting environment, while engaged in gin card game.  Mod instructional cueing to maintain equal weightbearing in standing as pt tends to keep most of his weight on the RLE instead of the left.  In addition, increased thoracic elongation noted on the left when attempting to use the LUE to play and pick up his cards.  Min facilitation for decreased shoulder hike on the left side.    Therapy Documentation Precautions:  Precautions Precautions: Fall Precaution Comments:   Restrictions Weight Bearing Restrictions: No  Pain: Pain Assessment Pain Assessment: No/denies pain ADL: See FIM for current functional status  Therapy/Group: Individual Therapy  MCGUIRE,JAMES OTR/L 02/12/2015, 3:45 PM

## 2015-02-12 NOTE — Progress Notes (Signed)
Physical Therapy Session Note  Patient Details  Name: Dennis [name] MRN: [id number] Date of Birth: Mar 20, 1961  Today's Date: 02/12/2015 PT Individual Time: 0800-0900 PT Individual Time Calculation (min): 60 min   Short Term Goals: Week 1:  PT Short Term Goal 1 (Week 1): STG's = LTG's due to ELOS  Skilled Therapeutic Interventions/Progress Updates:   While resting in bed, BP 88/58 using Dynamap.  RN took BP manually, 98/64.  Pt had no c/o of dizziness.  Bed> w/c to L with close supervision, stand pivot.  neuromuscular re-education via forced use, VCs, demo, manual cues for: - bil LE and bil UE coordination, propelling w/c x 50' slowly with supervision. -Gait without AD x 70' , x 150' with cues for upright stance , L hip activation, forward gaze and L foot clearance, without LOB, with close supervision> min guard.   -Quadruped> 2 point on mat for core stabilization, manual cues for L hip/trunk activation and postural control. - L Biased standing during L hand fine motor activity, x 2 minutes for hip abductor activation, min guard assist  - "floor clock" step/tap activity for single limb stance balance, with min guard assist -Standing/reaching overhead  with L hand, activity requiring many transitional turns L and R, close supervision/min guard assist without LOB.   Pt fatigued from biased standing L.  Manual cues to L hip for hip abduction decrease Trendelenburg deviation during gait.     Pt returned to room and requested sitting EOB.  Transfer with close supervision. OT entered room to start next session. Therapy Documentation Precautions:  Precautions Precautions: Fall Precaution Comments:   Restrictions Weight Bearing Restrictions: No   Pain: Pain Assessment Pain Assessment: No/denies pain    See FIM for current functional status  Therapy/Group: Individual Therapy  COOK,CAROLINE 02/12/2015, 4:55 PM

## 2015-02-12 NOTE — Progress Notes (Signed)
Advanced Heart Failure Rounding Note   Subjective:    Feeling better. Tolerating rehab. Weights inconsistent. Renal function stable. SBP 90s.   Objective:   Weight Range:  Vital Signs:   Temp:  [97.6 F (36.4 C)-98.9 F (37.2 C)] 98.9 F (37.2 C) (05/02 1500) Pulse Rate:  [83-90] 83 (05/02 1500) Resp:  [17-18] 17 (05/02 1025) BP: (85-98)/(61-66) 85/61 mmHg (05/02 1500) SpO2:  [98 %-100 %] 100 % (05/02 1500) Weight:  [67.541 kg (148 lb 14.4 oz)] 67.541 kg (148 lb 14.4 oz) (05/02 0732) Last BM Date: 02/11/15  Intake/Output:   Intake/Output Summary (Last 24 hours) at 02/12/15 1524 Last data filed at 02/12/15 1505  Gross per 24 hour  Intake   1491 ml  Output    525 ml  Net    966 ml   Physical Exam:  General: In bed.  No resp difficulty HEENT: normal Neck: supple. JVP 5 Carotids 2+ bilat; no bruits. No lymphadenopathy or thryomegaly appreciated. Cor: PMI laterally displaced. Tachy Regular rate & rhythm. +s3 2/6 MR Lungs: clear Abdomen: soft, nontender, mildly distended. No hepatosplenomegaly. No bruits or masses. Good bowel sounds. Extremities: no cyanosis, clubbing, rash, edema. LUE weak . RUE PICC Neuro: alert & orientedx3, cranial nerves grossly intact. Affect flat    Labs: Basic Metabolic Panel:  Recent Labs Lab 02/08/15 0512 02/12/15 0440  NA 136 139  K 4.4 4.1  CL 102 105  CO2 27 29  GLUCOSE 204* 66*  BUN 14 17  CREATININE 0.91 0.94  CALCIUM 8.8 9.1    Liver Function Tests:  Recent Labs Lab 02/08/15 0512  AST 25  ALT 28  ALKPHOS 86  BILITOT 1.1  PROT 5.5*  ALBUMIN 2.8*   No results for input(s): LIPASE, AMYLASE in the last 168 hours. No results for input(s): AMMONIA in the last 168 hours.  CBC:  Recent Labs Lab 02/08/15 0512 02/11/15 0449  WBC 10.3 8.9  HGB 10.3* 10.5*  HCT 31.5* 32.6*  MCV 96.6 96.7  PLT 236 251    Cardiac Enzymes: No results for input(s): CKTOTAL, CKMB, CKMBINDEX, TROPONINI in the last 168  hours.  BNP: BNP (last 3 results)  Recent Labs  12/03/14 0351 01/24/15 2005  BNP 321.1* 166.1*    ProBNP (last 3 results)  Recent Labs  08/13/14 1850 09/21/14 1928 10/02/14 1033  PROBNP 1525.0* 1870.0* 1085.0*      Other results:  Imaging: No results found.   Medications:     Scheduled Medications: . atorvastatin  20 mg Oral q1800  . digoxin  0.25 mg Oral Daily  . feeding supplement (GLUCERNA SHAKE)  237 mL Oral BID BM  . hydrocerin   Topical BID  . insulin aspart  0-15 Units Subcutaneous TID WC  . insulin aspart  0-5 Units Subcutaneous QHS  . insulin aspart  4 Units Subcutaneous TID WC  . insulin glargine  30 Units Subcutaneous QHS  . ivabradine  7.5 mg Oral BID WC  . senna-docusate  2 tablet Oral BID  . sodium chloride  10-40 mL Intracatheter Q12H  . spironolactone  12.5 mg Oral Daily  . warfarin  5 mg Oral ONCE-1800  . Warfarin - Pharmacist Dosing Inpatient   Does not apply q1800    Infusions:    PRN Medications: acetaminophen, alum & mag hydroxide-simeth, bisacodyl, guaiFENesin-dextromethorphan, ondansetron **OR** ondansetron (ZOFRAN) IV, sodium chloride, traZODone   Assessment:  1. CVA, acute with left-sided weakness 2. Dyspnea 3. Acute on Chronic Systolic Heart Failure- ECHO EF 10-15%  with non compaction 4. CAD Coronary angiography (12/2008) with mid LAD totally occluded and collateralized. Continue statin and BB. No ASA he is on coumadin for noncompaction. . 5. DMII  6. Hypotension  Plan/Discussion:    BP soft but overall stable. Volume status looks good. Continue current meds. We will consult case manager for help with obtaining ivabradine.  Bensimhon, Daniel,MD 3:24 PM Advanced Heart Failure Team Pager [phone] (M-F; 7a - 4p)  Please contact [facility] for night-coverage after hours (4p -7a ) and weekends on [email or web address]

## 2015-02-12 NOTE — Significant Event (Signed)
Hypoglycemic Event  CBG: 67  Treatment: 15 GM carbohydrate snack  Symptoms: None  Follow-up CBG: Time:0716 CBG Result:74  Possible Reasons for Event: Unknown  Comments/MD notified:Dr. Irma Newness, Anissa D  Remember to initiate Hypoglycemia Order Set & complete

## 2015-02-12 NOTE — Progress Notes (Signed)
Speech Language Pathology Daily Session Note  Patient Details  Name: Dennis [name] MRN: [id number] Date of Birth: 28-Mar-1961  Today's Date: 02/12/2015 SLP Individual Time: 1400-1430 SLP Individual Time Calculation (min): 30 min  Short Term Goals: Week 1: SLP Short Term Goal 1 (Week 1): Pt will complete complex problem solving during functional tasks with mod I over 2 consecutive sessions.  SLP Short Term Goal 2 (Week 1): Pt will recall complex information with mod I use of compensatory aids.   SLP Short Term Goal 3 (Week 1): Pt will recognize and correct errors during semi-complex tasks with mod I  Skilled Therapeutic Interventions:  Pt was seen for skilled ST targeting cognitive goals.   SLP facilitated the session with a semi-complex financial management task targeting higher level functional problem solving. Pt required min assist verbal cues for self monitoring and correction of errors during the abovementioned task due to impulsivity and poor error awareness when utilizing the calculator.  Pt was mod I for recall of when his current bills are scheduled to be paid.  Pt also independently demonstrated appropriate prospective recall of extra bills that will be due this month.  Pt would continue to benefit from skilled ST while inpatient to facilitate return to previous level of function; however, suspect that pt is near his baseline and do not anticipate ST needs at discharge.  Continue per current plan of care.  FIM:  Comprehension Comprehension Mode: Auditory Comprehension: 5-Understands complex 90% of the time/Cues < 10% of the time Expression Expression Mode: Verbal Expression: 5-Expresses complex 90% of the time/cues < 10% of the time Social Interaction Social Interaction: 6-Interacts appropriately with others with medication or extra time (anti-anxiety, antidepressant). Problem Solving Problem Solving: 5-Solves complex 90% of the time/cues < 10% of the time Memory Memory: 6-More  than reasonable amt of time FIM - Eating Eating Activity: 7: Complete independence:no helper  Pain Pain Assessment Pain Assessment: No/denies pain  Therapy/Group: Individual Therapy  Page, Selinda Orion 02/12/2015, 4:30 PM

## 2015-02-12 NOTE — Progress Notes (Signed)
ANTICOAGULATION CONSULT NOTE - Follow Up Consult  Pharmacy Consult for warfarin Indication: LV noncompaction  No Known Allergies  Patient Measurements: Height: 6' (182.9 cm) Weight: 148 lb 14.4 oz (67.541 kg) IBW/kg (Calculated) : 77.6  Vital Signs: Temp: 97.6 F (36.4 C) (05/02 1025) Temp Source: Oral (05/02 1025) BP: 88/61 mmHg (05/02 1025) Pulse Rate: 89 (05/02 1025)  Labs:  Recent Labs  02/10/15 0607 02/11/15 0449 02/12/15 0440  HGB  --  10.5*  --   HCT  --  32.6*  --   PLT  --  251  --   LABPROT 29.9*  --  32.0*  INR 2.82*  --  3.08*  CREATININE  --   --  0.94    Estimated Creatinine Clearance: 87.8 mL/min (by C-G formula based on Cr of 0.94).   Medications:  Scheduled:  . atorvastatin  20 mg Oral q1800  . digoxin  0.25 mg Oral Daily  . feeding supplement (GLUCERNA SHAKE)  237 mL Oral BID BM  . hydrocerin   Topical BID  . insulin aspart  0-15 Units Subcutaneous TID WC  . insulin aspart  0-5 Units Subcutaneous QHS  . insulin aspart  4 Units Subcutaneous TID WC  . insulin glargine  30 Units Subcutaneous QHS  . ivabradine  7.5 mg Oral BID WC  . senna-docusate  2 tablet Oral BID  . sodium chloride  10-40 mL Intracatheter Q12H  . spironolactone  12.5 mg Oral Daily  . warfarin  7.5 mg Oral q1800  . Warfarin - Pharmacist Dosing Inpatient   Does not apply q1800    Assessment: 53yoM w/ left ventricular noncompation currently on therapeutic coumadin.  No bleeding noted. Last CBC stable.   AC: Warfarin PTA for hx L ventricular noncompaction. ASA stopped per neuro rec since INR >2 Last anticoag note 12/01/14>>dose 7.'5mg'$  daily (med rec says '10mg'$  daily?)  INR slightly above goal today at 3.0, has been trending up so will decrease dose and reassess in am.  Plan:   Coumadin 5 mg po daily INR in am  Thank you for allowing pharmacy to be part of this patient's care team  Erin Hearing PharmD., BCPS Clinical Pharmacist Pager [phone] 02/12/2015 11:41 AM

## 2015-02-12 NOTE — Progress Notes (Signed)
Physical Therapy Session Note  Patient Details  Name: Dennis [name] MRN: [id number] Date of Birth: 1961/02/02  Today's Date: 02/12/2015 PT Individual Time: 1030-1100 PT Individual Time Calculation (min): 30 min   Short Term Goals: Week 1:  PT Short Term Goal 1 (Week 1): STG's = LTG's due to ELOS  Skilled Therapeutic Interventions/Progress Updates:   Pt received sitting in recliner in room, agreeable to therapy session.  Skilled session focused on functional gait in controlled and home simulated environment (carpet) x 300' without AD at close S level.  Note LLE tends to be uncoordinated, however did not note any toe or foot drag during gait.  Once in therapy gym, focused on dynamic balance with trunk rotation, use of LUE, and ankle/hip strategies while reaching for task low for increased glute/quad contraction and controlled movement and placing clothes pins to the L and also while placing beads in medicine bottle using LUE.  Progressed to task while standing on airdex foam pad and also on balance beam.  Requires close S for all tasks during session.  Ambulated to stair well and performed 24, 6" steps with use of L rail at S level in alternating pattern.  Ambulated back to room and left in recliner with all needs in reach.   Therapy Documentation Precautions:  Precautions Precautions: Fall Precaution Comments: L inattention, poor insight Restrictions Weight Bearing Restrictions: No   Vital Signs: Therapy Vitals Temp: 97.6 F (36.4 C) Temp Source: Oral Pulse Rate: 89 Resp: 17 BP: (!) 88/61 mmHg Patient Position (if appropriate): Sitting Oxygen Therapy SpO2: 100 % O2 Device: Not Delivered Pain: Pain Assessment Pain Assessment: No/denies pain Pain Score: 0-No pain   Locomotion : Ambulation Ambulation/Gait Assistance: 5: Supervision   See FIM for current functional status  Therapy/Group: Individual Therapy  Denice Bors 02/12/2015, 12:32 PM

## 2015-02-12 NOTE — Progress Notes (Signed)
Occupational Therapy Session Note  Patient Details  Name: Dennis [name] MRN: [id number] Date of Birth: Dec 23, 1960  Today's Date: 02/12/2015 OT Individual Time: 0904-1001 OT Individual Time Calculation (min): 57 min    Short Term Goals: Week 1:  OT Short Term Goal 1 (Week 1): STGs equal to LTGs based on ELOS of 1 week.  Skilled Therapeutic Interventions/Progress Updates:    Pt performed bathing and dressing during session.  Close supervision for all aspects with pt standing to perform shower.  Focused pt on dividing attention between conversation and activity.  He was able to gather all items for shower and for dressing during task as well.  Completed dressing with min instructional cueing sit to stand EOB.  Discussed pt's progress with significant other and pt.  Pt inquiring when he will be able to drive.  Deferred question to MD but did state it will likely not be when he leaves this hospital.    Therapy Documentation Precautions:  Precautions Precautions: Fall Precaution Comments: L inattention, poor insight Restrictions Weight Bearing Restrictions: No  Pain: Pain Assessment Pain Assessment: No/denies pain Pain Score: 0-No pain ADL: See FIM for current functional status  Therapy/Group: Individual Therapy  MCGUIRE,JAMES OTR/L 02/12/2015, 1:07 PM

## 2015-02-12 NOTE — Progress Notes (Signed)
54 y.o. 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 Blood sugar- 536, INR 1.02 and UDS positive for cocaine. CT head with early signs of R-MCA infarct and CTA head/neck done revealing evolving anterior division right MCA infarct. Dr. Erlinda Hong recommended resuming Coumadin for embolic stroke due to low EF.  Subjective/Complaints: Low CBG this Review of Systems - Negative except Left arm weakness Objective: Vital Signs: Blood pressure 95/66, pulse 88, temperature 98.6 F (37 C), temperature source Oral, resp. rate 18, height 6' (1.829 m), weight 67.541 kg (148 lb 14.4 oz), SpO2 100 %. No results found. Results for orders placed or performed during the hospital encounter of 02/07/15 (from the past 72 hour(s))  Glucose, capillary     Status: Abnormal   Collection Time: 02/09/15  9:47 AM  Result Value Ref Range   Glucose-Capillary 246 (H) 70 - 99 mg/dL  Glucose, capillary     Status: Abnormal   Collection Time: 02/09/15 12:01 PM  Result Value Ref Range   Glucose-Capillary 118 (H) 70 - 99 mg/dL  Glucose, capillary     Status: Abnormal   Collection Time: 02/09/15  4:56 PM  Result Value Ref Range   Glucose-Capillary 103 (H) 70 - 99 mg/dL  Glucose, capillary     Status: Abnormal   Collection Time: 02/09/15  8:44 PM  Result Value Ref Range   Glucose-Capillary 161 (H) 70 - 99 mg/dL   Comment 1 Notify RN   Protime-INR     Status: Abnormal   Collection Time: 02/10/15  6:07 AM  Result Value Ref Range   Prothrombin Time 29.9 (H) 11.6 - 15.2 seconds   INR 2.82 (H) 0.00 - 1.49  Glucose, capillary     Status: Abnormal   Collection Time: 02/10/15  6:23 AM  Result Value Ref Range   Glucose-Capillary 159 (H) 70 - 99 mg/dL   Comment 1 Notify RN   Glucose, capillary     Status: None   Collection  Time: 02/10/15 11:42 AM  Result Value Ref Range   Glucose-Capillary 90 70 - 99 mg/dL  Glucose, capillary     Status: Abnormal   Collection Time: 02/10/15  4:57 PM  Result Value Ref Range   Glucose-Capillary 198 (H) 70 - 99 mg/dL  Glucose, capillary     Status: Abnormal   Collection Time: 02/10/15  8:45 PM  Result Value Ref Range   Glucose-Capillary 123 (H) 70 - 99 mg/dL   Comment 1 Notify RN   CBC     Status: Abnormal   Collection Time: 02/11/15  4:49 AM  Result Value Ref Range   WBC 8.9 4.0 - 10.5 K/uL   RBC 3.37 (L) 4.22 - 5.81 MIL/uL   Hemoglobin 10.5 (L) 13.0 - 17.0 g/dL   HCT 32.6 (L) 39.0 - 52.0 %   MCV 96.7 78.0 - 100.0 fL   MCH 31.2 26.0 - 34.0 pg   MCHC 32.2 30.0 - 36.0 g/dL   RDW 14.3 11.5 - 15.5 %   Platelets 251 150 - 400 K/uL  Glucose, capillary     Status: Abnormal   Collection Time: 02/11/15  6:25 AM  Result Value Ref Range   Glucose-Capillary 164 (H) 70 - 99 mg/dL   Comment 1 Notify RN   Glucose, capillary     Status: None  Collection Time: 02/11/15 11:53 AM  Result Value Ref Range   Glucose-Capillary 76 70 - 99 mg/dL  Glucose, capillary     Status: Abnormal   Collection Time: 02/11/15  4:42 PM  Result Value Ref Range   Glucose-Capillary 191 (H) 70 - 99 mg/dL  Glucose, capillary     Status: Abnormal   Collection Time: 02/11/15  9:20 PM  Result Value Ref Range   Glucose-Capillary 149 (H) 70 - 99 mg/dL  Protime-INR     Status: Abnormal   Collection Time: 02/12/15  4:40 AM  Result Value Ref Range   Prothrombin Time 32.0 (H) 11.6 - 15.2 seconds   INR 3.08 (H) 0.00 - 9.21  Basic metabolic panel     Status: Abnormal   Collection Time: 02/12/15  4:40 AM  Result Value Ref Range   Sodium 139 135 - 145 mmol/L   Potassium 4.1 3.5 - 5.1 mmol/L   Chloride 105 101 - 111 mmol/L   CO2 29 22 - 32 mmol/L   Glucose, Bld 66 (L) 70 - 99 mg/dL   BUN 17 6 - 20 mg/dL   Creatinine, Ser 0.94 0.61 - 1.24 mg/dL   Calcium 9.1 8.9 - 10.3 mg/dL   GFR calc non Af Amer >60  >60 mL/min   GFR calc Af Amer >60 >60 mL/min    Comment: (NOTE) The eGFR has been calculated using the CKD EPI equation. This calculation has not been validated in all clinical situations. eGFR's persistently <90 mL/min signify possible Chronic Kidney Disease.    Anion gap 5 5 - 15  Glucose, capillary     Status: Abnormal   Collection Time: 02/12/15  6:50 AM  Result Value Ref Range   Glucose-Capillary 67 (L) 70 - 99 mg/dL  Glucose, capillary     Status: None   Collection Time: 02/12/15  7:14 AM  Result Value Ref Range   Glucose-Capillary 74 70 - 99 mg/dL     HEENT: normal Cardio: RRR Resp: CTA B/L GI: BS positive and NT,ND Extremity:  No Edema Skin:   Intact Neuro: Alert/Oriented and Abnormal Motor 3- LUE and LLE Musc/Skel:  Normal and Other No pain with UE or LE ROM Gen NAD   Assessment/Plan: 1. Functional deficits secondary to Right MCA infarct with Left hemiparesis which require 3+ hours per day of interdisciplinary therapy in a comprehensive inpatient rehab setting. Physiatrist is providing close team supervision and 24 hour management of active medical problems listed below. Physiatrist and rehab team continue to assess barriers to discharge/monitor patient progress toward functional and medical goals. FIM: FIM - Bathing Bathing Steps Patient Completed: Right Arm, Left Arm, Buttocks, Front perineal area, Chest, Right upper leg, Left upper leg, Right lower leg (including foot), Left lower leg (including foot), Abdomen Bathing: 5: Supervision: Safety issues/verbal cues  FIM - Upper Body Dressing/Undressing Upper body dressing/undressing steps patient completed: Thread/unthread right sleeve of pullover shirt/dresss, Thread/unthread left sleeve of pullover shirt/dress, Put head through opening of pull over shirt/dress, Pull shirt over trunk Upper body dressing/undressing: 5: Supervision: Safety issues/verbal cues FIM - Lower Body Dressing/Undressing Lower body  dressing/undressing steps patient completed: Thread/unthread right underwear leg, Pull underwear up/down, Thread/unthread right pants leg, Pull pants up/down, Thread/unthread left underwear leg, Thread/unthread left pants leg, Don/Doff right sock, Don/Doff left sock, Don/Doff left shoe, Fasten/unfasten right shoe, Don/Doff right shoe, Fasten/unfasten left shoe Lower body dressing/undressing: 5: Supervision: Safety issues/verbal cues  FIM - Toileting Toileting steps completed by patient: Adjust clothing prior to toileting,  Performs perineal hygiene, Adjust clothing after toileting Toileting Assistive Devices: Grab bar or rail for support Toileting: 4: Steadying assist     FIM - Control and instrumentation engineer Devices: Arm rests Bed/Chair Transfer: 6: Bed > Chair or W/C: No assist, 6: Chair or W/C > Bed: No assist  FIM - Locomotion: Wheelchair Locomotion: Wheelchair: 0: Activity did not occur FIM - Locomotion: Ambulation Ambulation/Gait Assistance: 5: Supervision Locomotion: Ambulation: 5: Travels 150 ft or more with supervision/safety issues  Comprehension Comprehension Mode: Auditory Comprehension: 5-Follows basic conversation/direction: With no assist  Expression Expression Mode: Verbal Expression: 5-Expresses basic 90% of the time/requires cueing < 10% of the time.  Social Interaction Social Interaction: 6-Interacts appropriately with others with medication or extra time (anti-anxiety, antidepressant).  Problem Solving Problem Solving: 5-Solves basic 90% of the time/requires cueing < 10% of the time  Memory Memory: 5-Recognizes or recalls 90% of the time/requires cueing < 10% of the time   Medical Problem List and Plan: 1. Functional deficits secondary to Embolic R-MCA infarct 2. DVT Prophylaxis/Anticoagulation: Pharmaceutical: Coumadin 3. Pain Management: Tylenol prn 4. Mood: LCSW to follow for evaluation and support.  5. Neuropsych: This patient is  capable of making decisions on her own behalf. 6. Skin/Wound Care: Routine pressure relief measures.  7. Fluids/Electrolytes/Nutrition: Monitor I/O. Check lytes in am. On thin liquids at this time.  8. ICM/NICM/Chronic Systolic CHF: Low salt diet. Monitor daily weights. Continue spironolactone and digoxin daily. Add TED hose to prevent orthostasis 9. DM type 2: Monitor BS with achs checks. Continue lantus 25 units qhs 10. Dyspnea: Encourage IS as well as activity.  LOS (Days) 5 A FACE TO FACE EVALUATION WAS PERFORMED  KIRSTEINS,ANDREW E 02/12/2015, 8:32 AM

## 2015-02-13 ENCOUNTER — Inpatient Hospital Stay (HOSPITAL_COMMUNITY): Payer: Medicare Other | Admitting: Speech Pathology

## 2015-02-13 ENCOUNTER — Inpatient Hospital Stay (HOSPITAL_COMMUNITY): Payer: Medicare Other | Admitting: Occupational Therapy

## 2015-02-13 ENCOUNTER — Inpatient Hospital Stay (HOSPITAL_COMMUNITY): Payer: Medicare Other

## 2015-02-13 LAB — GLUCOSE, CAPILLARY
Glucose-Capillary: 100 mg/dL — ABNORMAL HIGH (ref 70–99)
Glucose-Capillary: 195 mg/dL — ABNORMAL HIGH (ref 70–99)
Glucose-Capillary: 128 mg/dL — ABNORMAL HIGH (ref 70–99)
Glucose-Capillary: 134 mg/dL — ABNORMAL HIGH (ref 70–99)

## 2015-02-13 LAB — PROTIME-INR
INR: 2.96 — ABNORMAL HIGH (ref 0.00–1.49)
Prothrombin Time: 31 seconds — ABNORMAL HIGH (ref 11.6–15.2)

## 2015-02-13 MED ORDER — HYDROCERIN EX CREA: 1.0000 "application " | TOPICAL_CREAM | Freq: Two times a day (BID) | CUTANEOUS | Status: DC

## 2015-02-13 MED ORDER — WARFARIN SODIUM 7.5 MG PO TABS: 7.5000 mg | ORAL_TABLET | Freq: Every day | ORAL | Status: DC

## 2015-02-13 MED ORDER — SENNOSIDES-DOCUSATE SODIUM 8.6-50 MG PO TABS: 2.0000 | ORAL_TABLET | Freq: Two times a day (BID) | ORAL | Status: DC

## 2015-02-13 MED ORDER — INSULIN GLARGINE 100 UNIT/ML SOLOSTAR PEN: 30.0000 [IU] | PEN_INJECTOR | Freq: Every day | SUBCUTANEOUS | Status: DC

## 2015-02-13 MED ORDER — IVABRADINE HCL 7.5 MG PO TABS: 7.5000 mg | ORAL_TABLET | Freq: Two times a day (BID) | ORAL | Status: DC

## 2015-02-13 MED ORDER — SPIRONOLACTONE 25 MG PO TABS: 12.5000 mg | ORAL_TABLET | Freq: Every day | ORAL | Status: DC

## 2015-02-13 MED ORDER — WARFARIN SODIUM 5 MG PO TABS
5.0000 mg | ORAL_TABLET | Freq: Once | ORAL | Status: AC
  Administered 2015-02-13: 5 mg via ORAL
  Filled 2015-02-13: qty 1

## 2015-02-13 NOTE — Progress Notes (Signed)
Pharmacy does not have PA for Corlanor as this is not covered by his insurance.  Called [employer] with information regarding prior medications tried, BP issues and need for current medications. Cardiology notes faxed to support medication need and [facility] informed that patient set for d/c from hospital tomorrow.

## 2015-02-13 NOTE — Progress Notes (Signed)
Speech Language Pathology Daily Session Note  Patient Details  Name: Dennis [name] MRN: [id number] Date of Birth: 07/09/1961  Today's Date: 02/13/2015 SLP Individual Time: 0803-0900 SLP Individual Time Calculation (min): 57 min  Short Term Goals: Week 1: SLP Short Term Goal 1 (Week 1): Pt will complete complex problem solving during functional tasks with mod I over 2 consecutive sessions.  SLP Short Term Goal 2 (Week 1): Pt will recall complex information with mod I use of compensatory aids.   SLP Short Term Goal 3 (Week 1): Pt will recognize and correct errors during semi-complex tasks with mod I  Skilled Therapeutic Interventions:  Pt was seen for skilled ST targeting cognitive goals.  Upon arrival, pt was sitting upright in bed, awake, alert, and agreeable to participate in [employer].  Pt requested to brush teeth and wash face prior to leaving the room.  He completed the abovementioned self care tasks, including gathering materials with mod I.  SLP then facilitated the session with a novel semi-complex problem solving task targeting abstract reasoning, mental flexibility, and thought organization.  Pt made appropriate selections from a insurance comparison grid when given 5 targeted criteria with supervision.  Pt then generated 3 personally relevant criteria for selecting an insurance plan given his previous medical history with supervision question cues.  SLP administered portions of the [facility] standardized cognitive assessment with pt exhibiting mild deficits in higher level visuospatial and executive functioning skills.  Pt benefited from min assist faded to supervision cues to monitor and correct errors in the moment. Pt continues to make progress towards goals.   SLP will continue to follow up to complete diagnostic treatment and education prior to discharge.  Continue per current plan of care.    FIM:  Comprehension Comprehension Mode: Auditory Comprehension: 5-Understands complex 90% of the time/Cues  < 10% of the time Expression Expression Mode: Verbal Expression: 5-Expresses complex 90% of the time/cues < 10% of the time Social Interaction Social Interaction: 6-Interacts appropriately with others with medication or extra time (anti-anxiety, antidepressant). Problem Solving Problem Solving: 5-Solves complex 90% of the time/cues < 10% of the time Memory Memory: 6-More than reasonable amt of time FIM - Eating Eating Activity: 7: Complete independence:no helper  Pain Pain Assessment Pain Assessment: No/denies pain  Therapy/Group: Individual Therapy  Page, Selinda Orion 02/13/2015, 8:37 AM

## 2015-02-13 NOTE — Plan of Care (Signed)
Problem: RH KNOWLEDGE DEFICIT Goal: RH STG INCREASE KNOWLEDGE OF HYPERTENSION Outcome: Not Applicable Date Met:  67/88/93 Pt is hypotensive at this time.

## 2015-02-13 NOTE — Progress Notes (Signed)
ANTICOAGULATION CONSULT NOTE - Follow Up Consult  Pharmacy Consult for warfarin Indication: LV noncompaction  No Known Allergies  Patient Measurements: Height: 6' (182.9 cm) Weight: 148 lb 9.4 oz (67.4 kg) IBW/kg (Calculated) : 77.6  Vital Signs: Temp: 98.7 F (37.1 C) (05/03 0646) Temp Source: Oral (05/03 0646) BP: 94/72 mmHg (05/03 0744) Pulse Rate: 72 (05/03 0740)  Labs:  Recent Labs  02/11/15 0449 02/12/15 0440 02/13/15 1130  HGB 10.5*  --   --   HCT 32.6*  --   --   PLT 251  --   --   LABPROT  --  32.0* 31.0*  INR  --  3.08* 2.96*  CREATININE  --  0.94  --     Estimated Creatinine Clearance: 87.6 mL/min (by C-G formula based on Cr of 0.94).   Medications:  Scheduled:  . atorvastatin  20 mg Oral q1800  . digoxin  0.25 mg Oral Daily  . feeding supplement (GLUCERNA SHAKE)  237 mL Oral BID BM  . hydrocerin   Topical BID  . insulin aspart  0-15 Units Subcutaneous TID WC  . insulin aspart  0-5 Units Subcutaneous QHS  . insulin glargine  30 Units Subcutaneous QHS  . ivabradine  7.5 mg Oral BID WC  . senna-docusate  2 tablet Oral BID  . sodium chloride  10-40 mL Intracatheter Q12H  . spironolactone  12.5 mg Oral Daily  . warfarin  5 mg Oral ONCE-1800  . Warfarin - Pharmacist Dosing Inpatient   Does not apply q1800    Assessment: 53yoM w/ left ventricular noncompation currently on therapeutic coumadin.  No bleeding noted. Last CBC stable.   AC: Warfarin PTA for hx L ventricular noncompaction. ASA stopped per neuro rec since INR >2 Last anticoag note 12/01/14>>dose 7.'5mg'$  daily (med rec says '10mg'$  daily?)  INR at goal today at 2.9, at upper end of goal, will continue with '5mg'$  tonight.  Plan:   Coumadin 5 mg po tonight INR in am  Thank you for allowing pharmacy to be part of this patient's care team  Erin Hearing PharmD., BCPS Clinical Pharmacist Pager [phone] 02/13/2015 2:06 PM

## 2015-02-13 NOTE — Progress Notes (Signed)
Physical Therapy Session Note  Patient Details  Name: Dennis [name] MRN: [id number] Date of Birth: 07/08/1961  Today's Date: 02/13/2015 PT Individual Time: 1000-1100 PT Individual Time Calculation (min): 60 min   Short Term Goals: Week 1:  PT Short Term Goal 1 (Week 1): STG's = LTG's due to ELOS  Skilled Therapeutic Interventions/Progress Updates:  Session focused on advanced ambulation and balance activities incorporating left UE activities. Patient supervision for all activities requiring no assist to regain balance. Patient ambulated outside on ramp and up/down 18 stairs with 1 railing mod I. Patient worked on ambulating sideways, backwards, at varying speeds,and with stops,starts, and turns. Patient used [employer] balance system on limits of stability and maze settings with and without UE support and with movement of platform to work on weight shifting and balance strategies. Patient worked on left UE control bouncing ball off rebounder and dribbling ball with alternate UE's. Patient performed floor transfer with supervision.  Patient jogged 40 feet x 3 with supervision. Patient left in room with all items in reach.   Therapy Documentation Precautions:  Precautions Precautions: Fall Precaution Comments:   Restrictions Weight Bearing Restrictions: No  Pain: Pain Assessment Pain Assessment: No/denies pain  Locomotion : Ambulation Ambulation/Gait Assistance: 5: Supervision    See FIM for current functional status  Therapy/Group: Individual Therapy  [name] 02/13/2015, 1:06 PM

## 2015-02-13 NOTE — Progress Notes (Signed)
Occupational Therapy Session Note  Patient Details  Name: Dennis [name] MRN: [id number] Date of Birth: [birth year]  Today's Date: 02/13/2015 OT Individual Time: 0786-7544 OT Individual Time Calculation (min): 58 min    Short Term Goals: Week 1:  OT Short Term Goal 1 (Week 1): STGs equal to LTGs based on ELOS of 1 week.  Skilled Therapeutic Interventions/Progress Updates:    Pt worked on simple meal prep in the kitchen to fry and egg.  He was able to sequence through the task but needed increased time and also on some occasions would ask therapist "Did I put the egg in there?  He was able to check on his own without therapist telling him.  He did remember to turn off the stove but did not recall this until he was working on cleaning up items instead of turning the burner off immediately after removing it from the stove.  Second part of session focused on neuromuscular re-education for the LUE and LLE.  He was able to work on standing balance with the RLE held off of the floor to increased left hip stability and balance.  Gave pt ball to hold under right foot as well as having him move the LE toward targets off of the ball.  Supervision for balance during task with min instructional cueing to maintain left knee extension and hip extension.  Progressed to using both UEs to catch and toss a light ball progressing to tossing and catching a tennis ball, with just use of the LUE.  Pt able to catch the tennis ball approximately 60% of the time.    Therapy Documentation Precautions:  Precautions Precautions: Fall Precaution Comments:   Restrictions Weight Bearing Restrictions: No  Pain: Pain Assessment Pain Assessment: No/denies pain ADL: See FIM for current functional status  Therapy/Group: Individual Therapy  MCGUIRE,JAMES OTR/L 02/13/2015, 12:25 PM

## 2015-02-13 NOTE — Progress Notes (Signed)
Occupational Therapy Session Note  Patient Details  Name: Dennis [name] MRN: [id number] Date of Birth: 09/02/1961  Today's Date: 02/13/2015 OT Individual Time: 1402-1430 OT Individual Time Calculation (min): 28 min    Skilled Therapeutic Interventions/Progress Updates:    Session focus on static standing balance, divided attention, and use of the LUE for FM coordination.  Had pt work on playing card game while being distracted by the therapist and TV.  Had him incorporate use of the LUE to pick up cards, as well as to shuffle them, and deal them.  Pt able to complete task, maintaining selective attention with modified independence.  He was able to alternate attention when the phone rang by taking the call and then returning back to the game.    Therapy Documentation Precautions:  Precautions Precautions: None Precaution Comments:   Restrictions Weight Bearing Restrictions: No  Pain: Pain Assessment Pain Assessment: No/denies pain ADL: See FIM for current functional status  Therapy/Group: Individual Therapy  MCGUIRE,JAMES OTR/L 02/13/2015, 4:50 PM

## 2015-02-13 NOTE — Progress Notes (Signed)
Social Work Patient ID: Dennis [name], male   DOB: October 04, 1961, 53 y.o.   MRN: [id number] Team feels pt is reaching his goals and will be ready for discharge tomorrow after am therapies.  Dr Letta Pate feels he is medically stable for discharge tomorrow. Pam-PA working on Art gallery manager for insurance to Garment/textile technologist.  Have checked his medicines cost at [facility] and he has some co-pays, but corlanor. Made pt aware he will have co-pays for his medicines which he will need to pay for.  Discussed follow up therapies and he can not afford the $40 co-pays for therapies. Home health there is no co-pays.  Will continue to work on discharge arrangements.

## 2015-02-13 NOTE — Progress Notes (Signed)
54 y.o. 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 Blood sugar- 536, INR 1.02 and UDS positive for cocaine. CT head with early signs of R-MCA infarct and CTA head/neck done revealing evolving anterior division right MCA infarct. Dr. Erlinda Hong recommended resuming Coumadin for embolic stroke due to low EF.  Subjective/Complaints: Per speech no swallowing issues, good intake, no evidence of L neglect, ADLs at sink    Review of Systems - Negative except Left arm weakness Objective: Vital Signs: Blood pressure 94/72, pulse 72, temperature 98.7 F (37.1 C), temperature source Oral, resp. rate 18, height 6' (1.829 m), weight 67.4 kg (148 lb 9.4 oz), SpO2 97 %. No results found. Results for orders placed or performed during the hospital encounter of 02/07/15 (from the past 72 hour(s))  Glucose, capillary     Status: None   Collection Time: 02/10/15 11:42 AM  Result Value Ref Range   Glucose-Capillary 90 70 - 99 mg/dL  Glucose, capillary     Status: Abnormal   Collection Time: 02/10/15  4:57 PM  Result Value Ref Range   Glucose-Capillary 198 (H) 70 - 99 mg/dL  Glucose, capillary     Status: Abnormal   Collection Time: 02/10/15  8:45 PM  Result Value Ref Range   Glucose-Capillary 123 (H) 70 - 99 mg/dL   Comment 1 Notify RN   CBC     Status: Abnormal   Collection Time: 02/11/15  4:49 AM  Result Value Ref Range   WBC 8.9 4.0 - 10.5 K/uL   RBC 3.37 (L) 4.22 - 5.81 MIL/uL   Hemoglobin 10.5 (L) 13.0 - 17.0 g/dL   HCT 32.6 (L) 39.0 - 52.0 %   MCV 96.7 78.0 - 100.0 fL   MCH 31.2 26.0 - 34.0 pg   MCHC 32.2 30.0 - 36.0 g/dL   RDW 14.3 11.5 - 15.5 %   Platelets 251 150 - 400 K/uL  Glucose, capillary     Status: Abnormal   Collection Time: 02/11/15  6:25 AM  Result Value Ref Range   Glucose-Capillary 164 (H) 70 - 99 mg/dL   Comment 1 Notify RN   Glucose, capillary     Status: None   Collection Time: 02/11/15 11:53 AM  Result Value Ref Range   Glucose-Capillary 76 70 - 99 mg/dL  Glucose, capillary     Status: Abnormal   Collection Time: 02/11/15  4:42 PM  Result Value Ref Range   Glucose-Capillary 191 (H) 70 - 99 mg/dL  Glucose, capillary     Status: Abnormal   Collection Time: 02/11/15  9:20 PM  Result Value Ref Range   Glucose-Capillary 149 (H) 70 - 99 mg/dL  Protime-INR     Status: Abnormal   Collection Time: 02/12/15  4:40 AM  Result Value Ref Range   Prothrombin Time 32.0 (H) 11.6 - 15.2 seconds   INR 3.08 (H) 0.00 - 8.00  Basic metabolic panel     Status: Abnormal   Collection Time: 02/12/15  4:40 AM  Result Value Ref Range   Sodium 139 135 - 145 mmol/L   Potassium 4.1 3.5 - 5.1 mmol/L   Chloride 105 101 - 111 mmol/L   CO2 29 22 - 32 mmol/L   Glucose, Bld 66 (L) 70 - 99 mg/dL   BUN 17 6 - 20 mg/dL  Creatinine, Ser 0.94 0.61 - 1.24 mg/dL   Calcium 9.1 8.9 - 10.3 mg/dL   GFR calc non Af Amer >60 >60 mL/min   GFR calc Af Amer >60 >60 mL/min    Comment: (NOTE) The eGFR has been calculated using the CKD EPI equation. This calculation has not been validated in all clinical situations. eGFR's persistently <90 mL/min signify possible Chronic Kidney Disease.    Anion gap 5 5 - 15  Glucose, capillary     Status: Abnormal   Collection Time: 02/12/15  6:50 AM  Result Value Ref Range   Glucose-Capillary 67 (L) 70 - 99 mg/dL  Glucose, capillary     Status: None   Collection Time: 02/12/15  7:14 AM  Result Value Ref Range   Glucose-Capillary 74 70 - 99 mg/dL  Glucose, capillary     Status: Abnormal   Collection Time: 02/12/15 11:35 AM  Result Value Ref Range   Glucose-Capillary 181 (H) 70 - 99 mg/dL   Comment 1 Notify RN   Glucose, capillary     Status: None   Collection Time: 02/12/15  4:23 PM  Result Value Ref Range   Glucose-Capillary 89 70 - 99  mg/dL   Comment 1 Notify RN   Glucose, capillary     Status: Abnormal   Collection Time: 02/12/15  9:14 PM  Result Value Ref Range   Glucose-Capillary 178 (H) 70 - 99 mg/dL  Glucose, capillary     Status: Abnormal   Collection Time: 02/13/15  6:51 AM  Result Value Ref Range   Glucose-Capillary 100 (H) 70 - 99 mg/dL     HEENT: normal Cardio: RRR Resp: CTA B/L GI: BS positive and NT,ND Extremity:  No Edema Skin:   Intact Neuro: Alert/Oriented and Abnormal Motor 3- LUE and LLE Musc/Skel:  Normal and Other No pain with UE or LE ROM Gen NAD   Assessment/Plan: 1. Functional deficits secondary to Right MCA infarct with Left hemiparesis which require 3+ hours per day of interdisciplinary therapy in a comprehensive inpatient rehab setting. Physiatrist is providing close team supervision and 24 hour management of active medical problems listed below. Physiatrist and rehab team continue to assess barriers to discharge/monitor patient progress toward functional and medical goals. FIM: FIM - Bathing Bathing Steps Patient Completed: Right Arm, Left Arm, Buttocks, Front perineal area, Chest, Right upper leg, Left upper leg, Right lower leg (including foot), Left lower leg (including foot), Abdomen Bathing: 5: Supervision: Safety issues/verbal cues  FIM - Upper Body Dressing/Undressing Upper body dressing/undressing steps patient completed: Thread/unthread right sleeve of pullover shirt/dresss, Thread/unthread left sleeve of pullover shirt/dress, Put head through opening of pull over shirt/dress, Pull shirt over trunk Upper body dressing/undressing: 5: Supervision: Safety issues/verbal cues FIM - Lower Body Dressing/Undressing Lower body dressing/undressing steps patient completed: Thread/unthread right underwear leg, Pull underwear up/down, Thread/unthread right pants leg, Pull pants up/down, Thread/unthread left underwear leg, Thread/unthread left pants leg, Don/Doff right sock, Don/Doff left  sock, Don/Doff left shoe, Fasten/unfasten right shoe, Don/Doff right shoe, Fasten/unfasten left shoe Lower body dressing/undressing: 5: Supervision: Safety issues/verbal cues  FIM - Toileting Toileting steps completed by patient: Adjust clothing prior to toileting, Performs perineal hygiene, Adjust clothing after toileting Toileting Assistive Devices: Grab bar or rail for support Toileting: 5: Supervision: Safety issues/verbal cues  FIM - Air cabin crew Transfers: 5-To toilet/BSC: Supervision (verbal cues/safety issues), 5-From toilet/BSC: Supervision (verbal cues/safety issues)  FIM - Control and instrumentation engineer Devices: Arm rests Bed/Chair Transfer: 6: Bed > Chair  or W/C: No assist, 6: Chair or W/C > Bed: No assist  FIM - Locomotion: Wheelchair Locomotion: Wheelchair: 0: Activity did not occur FIM - Locomotion: Ambulation Locomotion: Ambulation Assistive Devices: Other (comment) (no AD) Ambulation/Gait Assistance: 5: Supervision Locomotion: Ambulation: 5: Travels 150 ft or more with supervision/safety issues  Comprehension Comprehension Mode: Auditory Comprehension: 5-Understands complex 90% of the time/Cues < 10% of the time  Expression Expression Mode: Verbal Expression: 5-Expresses complex 90% of the time/cues < 10% of the time  Social Interaction Social Interaction: 6-Interacts appropriately with others with medication or extra time (anti-anxiety, antidepressant).  Problem Solving Problem Solving: 5-Solves complex 90% of the time/cues < 10% of the time  Memory Memory: 6-More than reasonable amt of time   Medical Problem List and Plan: 1. Functional deficits secondary to Embolic R-MCA infarct 2. DVT Prophylaxis/Anticoagulation: Pharmaceutical: Coumadin 3. Pain Management: Tylenol prn 4. Mood: LCSW to follow for evaluation and support.  5. Neuropsych: This patient is capable of making decisions on her own behalf. 6. Skin/Wound Care:  Routine pressure relief measures.  7. Fluids/Electrolytes/Nutrition: Monitor I/O. Check lytes in am. On thin liquids at this time.  8. ICM/NICM/Chronic Systolic CHF: Low salt diet. Monitor daily weights. Continue spironolactone and digoxin daily. Add TED hose to prevent orthostasis 9. DM type 2: Monitor BS with achs checks. Continue lantus 25 units qhs 10. Dyspnea: Encourage IS as well as activity.  LOS (Days) 6 A FACE TO FACE EVALUATION WAS PERFORMED  KIRSTEINS,ANDREW E 02/13/2015, 8:57 AM

## 2015-02-14 ENCOUNTER — Inpatient Hospital Stay (HOSPITAL_COMMUNITY): Payer: Medicare Other

## 2015-02-14 ENCOUNTER — Ambulatory Visit (HOSPITAL_COMMUNITY): Payer: Self-pay

## 2015-02-14 ENCOUNTER — Inpatient Hospital Stay (HOSPITAL_COMMUNITY): Payer: Medicare Other | Admitting: Occupational Therapy

## 2015-02-14 ENCOUNTER — Inpatient Hospital Stay (HOSPITAL_COMMUNITY): Payer: Medicare Other | Admitting: Speech Pathology

## 2015-02-14 ENCOUNTER — Telehealth: Payer: Self-pay | Admitting: *Deleted

## 2015-02-14 LAB — CBC
HCT: 33.2 % — ABNORMAL LOW (ref 39.0–52.0)
Hemoglobin: 11 g/dL — ABNORMAL LOW (ref 13.0–17.0)
MCH: 32.3 pg (ref 26.0–34.0)
MCHC: 33.1 g/dL (ref 30.0–36.0)
MCV: 97.4 fL (ref 78.0–100.0)
Platelets: 236 10*3/uL (ref 150–400)
RBC: 3.41 MIL/uL — ABNORMAL LOW (ref 4.22–5.81)
RDW: 14.2 % (ref 11.5–15.5)
WBC: 8.2 10*3/uL (ref 4.0–10.5)

## 2015-02-14 LAB — GLUCOSE, CAPILLARY
Glucose-Capillary: 114 mg/dL — ABNORMAL HIGH (ref 70–99)
Glucose-Capillary: 194 mg/dL — ABNORMAL HIGH (ref 70–99)

## 2015-02-14 LAB — PROTIME-INR
INR: 2.8 — ABNORMAL HIGH (ref 0.00–1.49)
Prothrombin Time: 29.7 seconds — ABNORMAL HIGH (ref 11.6–15.2)

## 2015-02-14 MED ORDER — INSULIN GLARGINE 100 UNIT/ML SOLOSTAR PEN: 30.0000 [IU] | PEN_INJECTOR | Freq: Every day | SUBCUTANEOUS | Status: DC

## 2015-02-14 MED ORDER — INSULIN ASPART 100 UNIT/ML FLEXPEN: 3.0000 [IU] | PEN_INJECTOR | Freq: Three times a day (TID) | SUBCUTANEOUS | Status: DC

## 2015-02-14 MED ORDER — INSULIN ASPART 100 UNIT/ML FLEXPEN: PEN_INJECTOR | SUBCUTANEOUS | Status: DC

## 2015-02-14 NOTE — Progress Notes (Signed)
ANTICOAGULATION CONSULT NOTE - Follow Up Consult  Pharmacy Consult for warfarin Indication: LV noncompaction  No Known Allergies  Patient Measurements: Height: 6' (182.9 cm) Weight: 148 lb 9.4 oz (67.4 kg) IBW/kg (Calculated) : 77.6  Vital Signs: Temp: 98.3 F (36.8 C) (05/04 0620) Temp Source: Oral (05/04 0620) BP: 95/64 mmHg (05/04 0620) Pulse Rate: 81 (05/04 0620)  Labs:  Recent Labs  02/12/15 0440 02/13/15 1130 02/14/15 0542  HGB  --   --  11.0*  HCT  --   --  33.2*  PLT  --   --  236  LABPROT 32.0* 31.0* 29.7*  INR 3.08* 2.96* 2.80*  CREATININE 0.94  --   --     Estimated Creatinine Clearance: 87.6 mL/min (by C-G formula based on Cr of 0.94).   Medications:  Scheduled:  . atorvastatin  20 mg Oral q1800  . digoxin  0.25 mg Oral Daily  . feeding supplement (GLUCERNA SHAKE)  237 mL Oral BID BM  . hydrocerin   Topical BID  . insulin aspart  0-15 Units Subcutaneous TID WC  . insulin aspart  0-5 Units Subcutaneous QHS  . insulin glargine  30 Units Subcutaneous QHS  . ivabradine  7.5 mg Oral BID WC  . senna-docusate  2 tablet Oral BID  . sodium chloride  10-40 mL Intracatheter Q12H  . spironolactone  12.5 mg Oral Daily  . Warfarin - Pharmacist Dosing Inpatient   Does not apply q1800    Assessment: 53yoM w/ left ventricular noncompation currently on therapeutic coumadin.  No bleeding noted. Last CBC stable.   AC: Warfarin PTA for hx L ventricular noncompaction. ASA stopped per neuro rec since INR >2 Last anticoag note 12/01/14>>dose 7.'5mg'$  daily (med rec says '10mg'$  daily?)  INR at goal today at 2.8, will continue with '5mg'$  daily.  Plan:   Coumadin 5 mg po daily  Likely home today  Thank you for allowing pharmacy to be part of this patient's care team  Erin Hearing PharmD., BCPS Clinical Pharmacist Pager [phone] 02/14/2015 12:54 PM

## 2015-02-14 NOTE — Discharge Summary (Signed)
Physician Discharge Summary  Patient ID: Dennis [name] MRN: [id number] DOB/AGE: [birth year] 54 y.o.  Admit date: 02/07/2015 Discharge date: 02/14/2015  Discharge Diagnoses:  Active Problems:   Chronic systolic heart failure   Hemiplegia affecting left nondominant side   Embolic stroke involving right middle cerebral artery   Acute CVA (cerebrovascular accident)   Discharged Condition: Stable    Labs:  Basic Metabolic Panel:  Recent Labs Lab 02/08/15 0512 02/12/15 0440  NA 136 139  K 4.4 4.1  CL 102 105  CO2 27 29  GLUCOSE 204* 66*  BUN 14 17  CREATININE 0.91 0.94  CALCIUM 8.8 9.1    CBC:  Recent Labs Lab 02/08/15 0512 02/11/15 0449 02/14/15 0542  WBC 10.3 8.9 8.2  HGB 10.3* 10.5* 11.0*  HCT 31.5* 32.6* 33.2*  MCV 96.6 96.7 97.4  PLT 236 251 236    CBG:  Recent Labs Lab 02/13/15 1203 02/13/15 1648 02/13/15 2047 02/14/15 0659 02/14/15 1128  GLUCAP 195* 128* 134* 114* 194*    Brief HPI:   Dennis [name] is a 54 y.o. 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 Blood sugar elevated at  536, INR 1.02 and UDS positive for cocaine. CT head with early signs of R-MCA infarct and CTA head/neck done revealing evolving anterior division right MCA infarct. Dr. Erlinda Hong recommended resuming Coumadin for embolic stroke due to low EF. He was admitted to rehab 01/26/2015 and  was maintained on lasix every other day. He had complaints of worsening of SOB with fatigue due to CHF and was transferred back to acute for management of lowput HF.   Cardiac cath done done on 04/27 revealing mildly elevated left sided pressures with mild pulmonary HTN and normal cardiac output.  Dr. Haroldine Laws recommended resuming low dose spironolactone in addition to invarbadine for CHF management.  Therapy ongoing and patient continues to be limited by left hemiparesis with LUE inattention, left visual deficits, poor safety awareness with distractibility requiring redirection to tasks. He was deemed medically stable to resume his rehab program on 02/07/15.    Hospital Course: Scotti Kosta was admitted to rehab 02/07/2015 for inpatient therapies to consist of PT, ST and OT at least three hours five days a week. Past admission physiatrist, therapy team and rehab RN have worked together to provide customized collaborative inpatient rehab. Blood pressures have been monitored on tid basis and SBP has ranged from 85/60 to 96/57. Dr. Haroldine Laws has been following for input and titrated corlanor to 7.5 mg bid. He is tolerating this without any orthostatic symptoms.  SOB has resolved and activity tolerance has greatly improved. Lytes have been monitored serially and renal status is stable. CBC shows improvement in H/H to 11.0/33.2.  Diabetes has been monitored with ac/hs checks and lantus was titrated upwards to 30 units for tighter control. He is to continue SSI additionally and follow up with PMD for further adjustment of insulin. Patient has been educated extensively on importance of medication compliance, diet compliance as well as nicotine/cocaine cessation.  LCSW has followed for assistance and patient plans on resuming NA meetings past discharge.  He has made good progress during his rehab stay and is currently at independent level. He will continue to receive follow up [place], [place], [place] and [place] [facility] past discharge.    Rehab course:  During patient's stay in rehab weekly team conferences were held to monitor patient's progress, set goals and discuss barriers to discharge. At admission, patient required min assist with mobility and self care tasks. He has had improvement in activity tolerance, balance, postural control, as well as ability to compensate for deficits. He is has had improvement in  functional use LUE  and  LLE as well as improved awareness. He is able to complete all ADL tasks independently.  He is able to ambulate 200' and navigate 14 steps at modified independent level.    Disposition:  Home   Diet: Cardiac diet 4 gram salt. Diabetic restrictions.   Special Instructions: 1. Check blood sugars before meals and at bedtime. Eat a small snack at bedtime if blood sugar is below 150.  2. Weight yourself daily. Contact cardiology if you notice shortness of breath, swelling or weight going up by 3 lbs two days in a row or 5 lbs overnigt 3. Resume  NA meetings.      Medication List    STOP taking these medications        feeding supplement (GLUCERNA SHAKE) Liqd     insulin aspart 100 UNIT/ML injection  Commonly known as:  NOVOLOG  Replaced by:  insulin aspart 100 UNIT/ML FlexPen     metFORMIN 500 MG tablet  Commonly known as:  GLUCOPHAGE      TAKE these medications        acetaminophen 500 MG tablet  Commonly known as:  TYLENOL  Take 1 tablet (500 mg total) by mouth every 6 (six) hours as needed for moderate pain.     atorvastatin 20 MG tablet  Commonly known as:  LIPITOR  Take 1 tablet (20 mg total) by mouth daily at 6 PM.     digoxin 0.25 MG tablet  Commonly known as:  LANOXIN  Take 1 tablet (0.25 mg total) by mouth daily.     glucose blood test strip  Commonly known as:  ONETOUCH VERIO  Use as instructed     hydrocerin Crea  Apply 1 application topically 2 (two) times daily.     insulin aspart 100 UNIT/ML FlexPen  Commonly known as:  NOVOLOG FLEXPEN  Inject 3-9 Units into the skin 3 (three) times daily with meals. Take 3 units if blood sugar is 150-200 range. Take 6 units if blood sugar is 201-250. Take 9 units if blood sugar is 251-300. Call MD for input  if blood sugars are ranging over 300.     Insulin Glargine 100 UNIT/ML Solostar Pen  Commonly known as:  LANTUS  Inject 30 Units into the skin daily at 10 pm. Dispense pen and needles      ivabradine 7.5 MG Tabs tablet  Commonly known as:  CORLANOR  Take 1 tablet (7.5 mg total) by mouth 2 (two) times daily with a meal.     QC PEN NEEDLES 31G X 6 MM Misc  Generic drug:  Insulin Pen Needle  1 each by Other route See admin instructions. Use daily with insulin.     senna-docusate 8.6-50 MG per tablet  Commonly known as:  Senokot-S  Take 2 tablets by mouth 2 (two) times daily.     spironolactone 25 MG tablet  Commonly known as:  ALDACTONE  Take 0.5 tablets (12.5 mg total) by mouth daily.     warfarin 7.5 MG tablet  Commonly known as:  COUMADIN  Take 1 tablet (7.5 mg total) by mouth daily at 6 PM.  Follow-up Information    Follow up with Charlett Blake, MD On 03/16/2015.   Specialty:  Physical Medicine and Rehabilitation   Why:  Be there at 9:45 am   for  10 am appointment   Contact information:   [address] [place] [address] [phone]       Follow up with Xu,Jindong, MD. Call today.   Specialty:  Neurology   Why:  for follow up appointment in 3-4 weeks.    Contact information:   [address] [place] [address] [phone]       Follow up with Mauricio Po, FNP On 02/21/2015.   Specialty:  Family Medicine   Why:  APPT @ 10;30 AM   Contact information:   [place] [address] [phone]       Follow up with Loralie Champagne, MD On 02/21/2015.   Specialty:  Cardiology   Why:  at 3:40 pm in the [facility]--gate code 0080--please bring all medications to appt   Contact information:   [address] [place] [address] [phone]       Signed: Bary Leriche 02/14/2015, 12:28 PM

## 2015-02-14 NOTE — Discharge Instructions (Signed)
Inpatient Rehab Discharge Instructions  Dennis [name] Discharge date and time:  02/14/15  Activities/Precautions/ Functional Status: Activity: no lifting, driving, or strenuous exercise for till cleared by MD.  Diet: cardiac diet 4 gram salt. Diabetic restrictions.  Wound Care: N/A  Functional status:  ___ No restrictions     ___ Walk up steps independently _X__ 24/7 supervision/assistance   ___ Walk up steps with assistance ___ Intermittent supervision/assistance  ___ Bathe/dress independently ___ Walk with walker     _X__ Bathe/dress with supervision _X__ Walk with supervision.    ___ Shower independently ___ Walk with assistance    ___ Shower with assistance _X__ No alcohol     ___ Return to work/school ________  Special Instructions: 1. Check blood sugars before meals and at bedtime. Eat a small snack at bedtime if blood sugar is below 150.  2. Weight yourself daily. Contact cardiology if you notice shortness of breath, swelling or weight going up by 3 lbs two days in a row or 5 lbs overnight.    COMMUNITY REFERRALS UPON DISCHARGE:    Home Health:   PT, OT, RN & [place]   Date of last service:02/14/2015  Medical Equipment/Items Ordered:NONE NEEDED   Other:FELT SUBSTANCE ABUSE RESOURCES NOT NECESSARY WILL RESUME NA MEETINGS  GENERAL COMMUNITY RESOURCES FOR PATIENT/FAMILY: Support Groups: CVA SUPPORT GROUP    STROKE/TIA DISCHARGE INSTRUCTIONS SMOKING Cigarette smoking nearly doubles your risk of having a stroke & is the single most alterable risk factor  If you smoke or have smoked in the last 12 months, you are advised to quit smoking for your health.  Most of the excess cardiovascular risk related to smoking disappears within a year of stopping.  Ask you doctor about anti-smoking medications  [place] Quit Line: 1-800-QUIT NOW  Free Smoking Cessation Classes [phone]  CHOLESTEROL Know your levels; limit fat & cholesterol in your  diet  Lipid Panel     Component Value Date/Time   CHOL 197 01/25/2015 0535   TRIG 42 01/25/2015 0535   HDL 73 01/25/2015 0535   CHOLHDL 2.7 01/25/2015 0535   VLDL 8 01/25/2015 0535   LDLCALC 116* 01/25/2015 0535      Many patients benefit from treatment even if their cholesterol is at goal.  Goal: Total Cholesterol (CHOL) less than 160  Goal:  Triglycerides (TRIG) less than 150  Goal:  HDL greater than 40  Goal:  LDL (LDLCALC) less than 100   BLOOD PRESSURE American Stroke Association blood pressure target is less that 120/80 mm/Hg  Your discharge blood pressure is:  BP: 95/64 mmHg  Monitor your blood pressure  Limit your salt and alcohol intake  Many individuals will require more than one medication for high blood pressure  DIABETES (A1c is a blood sugar average for last 3 months) Goal HGBA1c is under 7% (HBGA1c is blood sugar average for last 3 months)  Diabetes:     Lab Results  Component Value Date   HGBA1C 13.3* 01/24/2015     Your HGBA1c can be lowered with medications, healthy diet, and exercise.  Check your blood sugar as directed by your physician  Call your physician if you experience unexplained or low blood sugars.  PHYSICAL ACTIVITY/REHABILITATION Goal is 30 minutes at least 4 days per week  Activity: No driving, Therapies: See above Return to work: N/A  Activity decreases your risk of heart attack and stroke and makes your heart stronger.  It helps control your weight and blood pressure;  helps you relax and can improve your mood.  Participate in a regular exercise program.  Talk with your doctor about the best form of exercise for you (dancing, walking, swimming, cycling).  DIET/WEIGHT Goal is to maintain a healthy weight  Your discharge diet is: Diet heart healthy/carb modified. Your height is:  Height: 6' (182.9 cm) Your current weight is: Weight: 67.4 kg (148 lb 9.4 oz) Your Body Mass Index (BMI) is:  BMI (Calculated): 22.5  Following the  type of diet specifically designed for you will help prevent another stroke.  Your are at goal weight.  Your goal Body Mass Index (BMI) is 19-24.  Healthy food habits can help reduce 3 risk factors for stroke:  High cholesterol, hypertension, and excess weight.  RESOURCES Stroke/Support Group:  Call [phone]   STROKE EDUCATION PROVIDED/REVIEWED AND GIVEN TO PATIENT Stroke warning signs and symptoms How to activate emergency medical system (call 911). Medications prescribed at discharge. Need for follow-up after discharge. Personal risk factors for stroke. Pneumonia vaccine given:  Flu vaccine given:  My questions have been answered, the writing is legible, and I understand these instructions.  I will adhere to these goals & educational materials that have been provided to me after my discharge from the hospital.       My questions have been answered and I understand these instructions. I will adhere to these goals and the provided educational materials after my discharge from the hospital.  Patient/Caregiver Signature _______________________________ Date __________  Clinician Signature _______________________________________ Date __________  Please bring this form and your medication list with you to all your follow-up doctor's appointments.

## 2015-02-14 NOTE — Progress Notes (Signed)
Physical Therapy Discharge Summary  Patient Details  Name: Dennis [name] MRN: [id number] Date of Birth: November 01, 1960  Today's Date: 02/14/2015 PT Individual Time:1115-1130;  1240-1300 PT Individual Time Calculation (min): 15; 20 min   Patient has met 7 of 8 long term goals due to improved activity tolerance, improved balance, functional use of  left upper extremity and left lower extremity, improved attention and improved awareness.  Patient to discharge at an ambulatory level Independent.   Patient's care partner n/a. Reasons goals not met: car transfer not performed at d/c due to time constraints; pt's PT had to be re-scheduled due to removal of PIC line and need to lie supine afterward.  Recommendation:  Patient will benefit from ongoing skilled PT services in home health setting to continue to advance safe functional mobility, address ongoing impairments in high level balance, L hip and ankle stability, and minimize fall risk.  Equipment: No equipment provided  Reasons for discharge: treatment goals met and discharge from hospital  Patient/family agrees with progress made and goals achieved: Yes   Tx 1: RN and pt stated that he must lie flat in bed at least 30 minutes due to pic line removal.  PT discussed with pt his status at d/c, issued hand out of [place] for balance and motor control LLE and demonstrated exs.  PT plans to return after pt has eaten lunch to assess locomotion and pt's performance of exs.  Tx 2:  Checked out pt with [place]; he performed safely with hand out.  Educated pt on particular exs which will target his L hip and L ankle. Gait x 200' and up/down 14 steps 1 rail, independent. Discussed car transfer, but unable to perform due to time constraints.  PT Discharge Precautions/Restrictions Precautions Precautions: Fall Restrictions Weight Bearing Restrictions: No   Pain Pain Assessment Pain Assessment: No/denies pain    Cognition Overall Cognitive  Status: Within Functional Limits for tasks assessed Arousal/Alertness: Awake/alert Orientation Level: Oriented X4 Sensation Sensation Light Touch: Appears Intact Proprioception: Appears Intact Coordination Heel Shin Test: = bil Motor  Motor Motor: Hemiplegia;Abnormal postural alignment and control Motor - Skilled Clinical Observations: Pt with mild LUE and LLE hemiparesis Motor - Discharge Observations: Pt still with mild left hemiparesis in the UE and LE.  Pt with increased trunk and hip compensation noted during mobility as well as decreased control in the RLE with advancement of the LE.  Decreased gross and FM control in the LUE and hand with functional use.  Mobility Bed Mobility Bed Mobility:  (modified independent for all) Transfers Transfers: Yes Stand Pivot Transfers: 6: Modified independent (Device/Increase time) Locomotion  Ambulation Ambulation: Yes Ambulation/Gait Assistance: 7: Independent Ambulation Distance (Feet): 200 Feet Assistive device: None Gait Gait: Yes Gait Pattern: Impaired Gait Pattern: Decreased dorsiflexion - left;Lateral hip instability;Poor foot clearance - left (L foot M-L wobble movement during swing phase) Gait velocity: 4.1'/sec High Level Ambulation High Level Ambulation: Side stepping;Backwards walking;Direction changes;Sudden stops;Head turns Side Stepping: independent Backwards Walking:  supervision Direction Changes: modified ind Head Turns: NT Stairs / Additional Locomotion Stairs: Yes Stairs Assistance: 6: Modified independent (Device/Increase time) Stair Management Technique: One rail Right Number of Stairs: 14 Height of Stairs: 7 Wheelchair Mobility Wheelchair Mobility: No  Trunk/Postural Assessment  Cervical Assessment Cervical Assessment: Within Functional Limits Thoracic Assessment Thoracic Assessment: Within Functional Limits Lumbar Assessment Lumbar Assessment: Within Functional Limits Postural Control Postural  Control: Deficits on evaluation Postural Limitations: Pt with increased left  trunk flexion during stance phase  with ambulation.    Balance Static Sitting Balance Static Sitting - Level of Assistance: 7: Independent Dynamic Sitting Balance Dynamic Sitting - Level of Assistance: 7: Independent Static Standing Balance Static Standing - Level of Assistance: 7: Independent Dynamic Standing Balance Dynamic Standing - Level of Assistance: 7: Independent Extremity Assessment      RLE Assessment RLE Assessment: Within Functional Limits LLE Strength LLE Overall Strength: Deficits LLE Overall Strength Comments: grossly 4+/5 throughout; ankle unstable during swing phase LLE Tone LLE Tone: Within Functional Limits  See FIM for current functional status  COOK,CAROLINE 02/14/2015, 5:37 PM

## 2015-02-14 NOTE — Progress Notes (Signed)
Speech Language Pathology Discharge Summary  Patient Details  Name: Dennis [name] MRN: [id number] Date of Birth: [birth year]  Today's Date: 02/14/2015 SLP Individual Time: 0834-0900 SLP Individual Time Calculation (min): 26 min   Skilled Therapeutic Interventions:  Pt was seen for skilled ST targeting grad day activities.  SLP administered the remaining portions of the [facility] standardized assessment of cognitive function.  Pt scored 26/30 on the assessment which indicates cognitive function within functional limits.  Pt continues to endorse that he is returned to cognitive baseline.  No further education is needed given that pt is at baseline; however, SLP recommended that pt have initial assist for medication and financial management.       Patient has met 4 of 4 long term goals.  Patient to discharge at overall Modified Independent level.  Reasons goals not met: n/a   Clinical Impression/Discharge Summary:  Pt made functional gains while inpatient and has met 4 out of 4 long term goals.  Pt is currently mod I for basic to semi-complex cognitive tasks and reports that he is at baseline for cognition.  Pt is discharging home with intermittent supervision from family.  No further ST needs are indicated at this time.  All education is complete.     Care Partner:  Caregiver Able to Provide Assistance: Yes  Type of Caregiver Assistance: Physical;Cognitive  Recommendation:  None      Equipment:  None recommended by SLP    Reasons for discharge: Discharged from hospital   Patient/Family Agrees with Progress Made and Goals Achieved: Yes   See FIM for current functional status  Windell Moulding L 02/14/2015, 8:59 AM

## 2015-02-14 NOTE — Progress Notes (Signed)
Subjective/Complaints: Walking Indep in rm, ready for D/C  Discussed lifestyle issues, smoking and cocaine use discussed   Recurerent stroke risk discussed    Review of Systems - Negative except Left arm weakness Objective: Vital Signs: Blood pressure 95/64, pulse 81, temperature 98.3 F (36.8 C), temperature source Oral, resp. rate 17, height 6' (1.829 m), weight 67.4 kg (148 lb 9.4 oz), SpO2 97 %. No results found. Results for orders placed or performed during the hospital encounter of 02/07/15 (from the past 72 hour(s))  Glucose, capillary     Status: None   Collection Time: 02/11/15 11:53 AM  Result Value Ref Range   Glucose-Capillary 76 70 - 99 mg/dL  Glucose, capillary     Status: Abnormal   Collection Time: 02/11/15  4:42 PM  Result Value Ref Range   Glucose-Capillary 191 (H) 70 - 99 mg/dL  Glucose, capillary     Status: Abnormal   Collection Time: 02/11/15  9:20 PM  Result Value Ref Range   Glucose-Capillary 149 (H) 70 - 99 mg/dL  Protime-INR     Status: Abnormal   Collection Time: 02/12/15  4:40 AM  Result Value Ref Range   Prothrombin Time 32.0 (H) 11.6 - 15.2 seconds   INR 3.08 (H) 0.00 - 1.28  Basic metabolic panel     Status: Abnormal   Collection Time: 02/12/15  4:40 AM  Result Value Ref Range   Sodium 139 135 - 145 mmol/L   Potassium 4.1 3.5 - 5.1 mmol/L   Chloride 105 101 - 111 mmol/L   CO2 29 22 - 32 mmol/L   Glucose, Bld 66 (L) 70 - 99 mg/dL   BUN 17 6 - 20 mg/dL   Creatinine, Ser 0.94 0.61 - 1.24 mg/dL   Calcium 9.1 8.9 - 10.3 mg/dL   GFR calc non Af Amer >60 >60 mL/min   GFR calc Af Amer >60 >60 mL/min    Comment: (NOTE) The eGFR has been calculated using the CKD EPI equation. This calculation has not been validated in all clinical situations. eGFR's persistently <90 mL/min signify possible Chronic Kidney Disease.    Anion gap 5 5 - 15  Glucose, capillary     Status: Abnormal   Collection Time: 02/12/15  6:50 AM  Result Value Ref Range   Glucose-Capillary 67 (L) 70 - 99 mg/dL  Glucose, capillary     Status: None   Collection Time: 02/12/15  7:14 AM  Result Value Ref Range   Glucose-Capillary 74 70 - 99 mg/dL  Glucose, capillary     Status: Abnormal   Collection Time: 02/12/15 11:35 AM  Result Value Ref Range   Glucose-Capillary 181 (H) 70 - 99 mg/dL   Comment 1 Notify RN   Glucose, capillary     Status: None   Collection Time: 02/12/15  4:23 PM  Result Value Ref Range   Glucose-Capillary 89 70 - 99 mg/dL   Comment 1 Notify RN   Glucose, capillary     Status: Abnormal   Collection Time: 02/12/15  9:14 PM  Result Value Ref Range   Glucose-Capillary 178 (H) 70 - 99 mg/dL  Glucose, capillary     Status: Abnormal   Collection Time: 02/13/15  6:51 AM  Result Value Ref Range   Glucose-Capillary 100 (H) 70 - 99 mg/dL  Protime-INR     Status: Abnormal   Collection Time: 02/13/15 11:30 AM  Result Value Ref Range   Prothrombin Time 31.0 (H) 11.6 - 15.2 seconds   INR  2.96 (H) 0.00 - 1.49  Glucose, capillary     Status: Abnormal   Collection Time: 02/13/15 12:03 PM  Result Value Ref Range   Glucose-Capillary 195 (H) 70 - 99 mg/dL  Glucose, capillary     Status: Abnormal   Collection Time: 02/13/15  4:48 PM  Result Value Ref Range   Glucose-Capillary 128 (H) 70 - 99 mg/dL  Glucose, capillary     Status: Abnormal   Collection Time: 02/13/15  8:47 PM  Result Value Ref Range   Glucose-Capillary 134 (H) 70 - 99 mg/dL  CBC     Status: Abnormal   Collection Time: 02/14/15  5:42 AM  Result Value Ref Range   WBC 8.2 4.0 - 10.5 K/uL   RBC 3.41 (L) 4.22 - 5.81 MIL/uL   Hemoglobin 11.0 (L) 13.0 - 17.0 g/dL   HCT 33.2 (L) 39.0 - 52.0 %   MCV 97.4 78.0 - 100.0 fL   MCH 32.3 26.0 - 34.0 pg   MCHC 33.1 30.0 - 36.0 g/dL   RDW 14.2 11.5 - 15.5 %   Platelets 236 150 - 400 K/uL  Protime-INR     Status: Abnormal   Collection Time: 02/14/15  5:42 AM  Result Value Ref Range   Prothrombin Time 29.7 (H) 11.6 - 15.2 seconds   INR  2.80 (H) 0.00 - 1.49  Glucose, capillary     Status: Abnormal   Collection Time: 02/14/15  6:59 AM  Result Value Ref Range   Glucose-Capillary 114 (H) 70 - 99 mg/dL     HEENT: normal Cardio: RRR Resp: CTA B/L GI: BS positive and NT,ND Extremity:  No Edema Skin:   Intact Neuro: Alert/Oriented and Abnormal Motor 3- LUE and LLE Musc/Skel:  Normal and Other No pain with UE or LE ROM Gen NAD   Assessment/Plan: 1. Functional deficits secondary to Right MCA infarct with Left hemiparesis Stable for D/C today F/u PCP in 1-2 weeks F/u PM&R 3 weeks Cardiology f/u CHF clinic See D/C summary See D/C instructionsFIM: FIM - Bathing Bathing Steps Patient Completed: Right Arm, Left Arm, Buttocks, Front perineal area, Chest, Right upper leg, Left upper leg, Right lower leg (including foot), Left lower leg (including foot), Abdomen Bathing: 5: Supervision: Safety issues/verbal cues  FIM - Upper Body Dressing/Undressing Upper body dressing/undressing steps patient completed: Thread/unthread right sleeve of pullover shirt/dresss, Thread/unthread left sleeve of pullover shirt/dress, Put head through opening of pull over shirt/dress, Pull shirt over trunk Upper body dressing/undressing: 5: Supervision: Safety issues/verbal cues FIM - Lower Body Dressing/Undressing Lower body dressing/undressing steps patient completed: Thread/unthread right underwear leg, Pull underwear up/down, Thread/unthread right pants leg, Pull pants up/down, Thread/unthread left underwear leg, Thread/unthread left pants leg, Don/Doff right sock, Don/Doff left sock, Don/Doff left shoe, Fasten/unfasten right shoe, Don/Doff right shoe, Fasten/unfasten left shoe Lower body dressing/undressing: 5: Supervision: Safety issues/verbal cues  FIM - Toileting Toileting steps completed by patient: Adjust clothing prior to toileting, Performs perineal hygiene, Adjust clothing after toileting Toileting Assistive Devices: Grab bar or rail for  support Toileting: 5: Supervision: Safety issues/verbal cues  FIM - Air cabin crew Transfers: 5-To toilet/BSC: Supervision (verbal cues/safety issues), 5-From toilet/BSC: Supervision (verbal cues/safety issues)  FIM - Control and instrumentation engineer Devices: Arm rests Bed/Chair Transfer: 6: Sit > Supine: No assist, 6: Bed > Chair or W/C: No assist, 6: Chair or W/C > Bed: No assist  FIM - Locomotion: Wheelchair Locomotion: Wheelchair: 0: Activity did not occur FIM - Locomotion: Ambulation Locomotion: Chiropodist  Devices: Other (comment) (no AD) Ambulation/Gait Assistance: 5: Supervision Locomotion: Ambulation: 5: Travels 150 ft or more with supervision/safety issues  Comprehension Comprehension Mode: Auditory Comprehension: 5-Understands complex 90% of the time/Cues < 10% of the time  Expression Expression Mode: Verbal Expression: 5-Expresses complex 90% of the time/cues < 10% of the time  Social Interaction Social Interaction: 6-Interacts appropriately with others with medication or extra time (anti-anxiety, antidepressant).  Problem Solving Problem Solving: 5-Solves complex 90% of the time/cues < 10% of the time  Memory Memory: 6-More than reasonable amt of time   Medical Problem List and Plan: 1. Functional deficits secondary to Embolic R-MCA infarct   LOS (Days) 7 A FACE TO FACE EVALUATION WAS PERFORMED  KIRSTEINS,ANDREW E 02/14/2015, 9:58 AM

## 2015-02-14 NOTE — Telephone Encounter (Signed)
Pam with Outpatient Rehab called to inform the [facility] that the patient has been in the hospital due to having a stroke.  She states the patient is being discharged today with home health and [place] will check him on Friday and call to Coumadin Clinic.  Also, his INR at discharge was 2.8 and he is being discharged on 7.'5mg'$  of Coumadin daily.

## 2015-02-14 NOTE — Progress Notes (Signed)
Occupational Therapy Discharge Summary  Patient Details  Name: Dennis [name] MRN: [id number] Date of Birth: Jan 14, 1961  Today's Date: 02/14/2015 OT Individual Time: 0900-1030 OT Individual Time Calculation (min): 90 min   Session Note:  Pt gathered all items for bathing and dressing and then ambulated to the tub/shower room with modified independence.  He was able to complete all bathing in standing in the shower with modified independence.  He used his UE for support when bringing his LEs up for washing or for dressing.   Educated pt on LUE coordination exercises as well cervical AROM/stretching exercises.   Pt still with impaired coordination in the LLE with advancement during mobility as well as increased thoracic lateral flexion to the left in weightbearing.  Educated pt to be more aware of his posture during mobility as well as when performing coordination exercises with the LUE.  Encouraged use of mirror as well for visual feedback.     Patient has met 10 of 10 long term goals due to improved activity tolerance, improved balance, postural control, functional use of  LEFT upper and LEFT lower extremity, improved attention and improved coordination.  Patient to discharge at overall Modified Independent level.  Patient's care partner is independent to provide the necessary physical assistance at discharge.    Reasons goals not met: NA  Recommendation:  Patient will benefit from ongoing skilled OT services in outpatient setting to continue to advance functional skills in the area of BADL and iADL.  Pt still exhibits decreased strength and coordination in the LUE with all selfcare tasks.  In addition, he still demonstrates decreased attention and initiation of functional use of the LUE as well as impaired divided attention.  Feel he will need continued OT outpatient therapy to continue progression back to complete independence.  He will have PRN supervision from family but feel he will be ok alone in a  familiar environment.   Equipment: No equipment provided  Reasons for discharge: treatment goals met and discharge from hospital  Patient/family agrees with progress made and goals achieved: Yes  OT Discharge Precautions/Restrictions  Precautions Precautions: None Restrictions Weight Bearing Restrictions: No  Pain Pain Assessment Pain Assessment: No/denies pain ADL  See FIM scale for details  Vision/Perception  Vision- History Baseline Vision/History: No visual deficits Patient Visual Report: No change from baseline Vision- Assessment Vision Assessment?: No apparent visual deficits (Pt with acuity changes over the past year will need optometry visit which pt plans to arrange. )  Cognition Overall Cognitive Status: Within Functional Limits for tasks assessed Arousal/Alertness: Awake/alert Orientation Level: Oriented X4 Attention: Alternating;Divided Focused Attention: Appears intact Sustained Attention: Appears intact Sustained Attention Impairment: Functional basic Selective Attention: Appears intact Alternating Attention: Appears intact Divided Attention: Impaired Divided Attention Impairment: Functional complex Memory: Appears intact Awareness: Appears intact Problem Solving: Appears intact Sequencing: Appears intact Comments: Pt with better anticipatory awareness than on eval.  Still with slower and limited divided attention as he will at times leave the water running after grooming tasks but then after a few mins will realize it without verbal cues.   Sensation Sensation Light Touch: Appears Intact Stereognosis: Appears Intact Hot/Cold: Appears Intact Proprioception: Appears Intact Coordination Gross Motor Movements are Fluid and Coordinated: No Fine Motor Movements are Fluid and Coordinated: No Coordination and Movement Description: Pt with decreased gross and FM coordination in the LUE but he was able to tie his shoes with increased time.  Shoulder hike and  lean to the right noted in the LUE with  shoulder flexion.   9 Hole Peg Test: 28 seconds on the right and 1:13 seconds on the left Motor  Motor Motor: Hemiplegia;Abnormal postural alignment and control Motor - Discharge Observations: Pt still with mild left hemiparesis in the UE and LE.  Pt with increased trunk and hip compensation noted during mobility as well as decreased control in the RLE with advancement of the LE.  Decreased gross and FM control in the LUE and hand with functional use. Mobility  Bed Mobility Bed Mobility: Supine to Sit Supine to Sit: 7: Independent;HOB flat Transfers Transfers: Sit to Stand Sit to Stand: 6: Modified independent (Device/Increase time);From toilet;From bed;Without upper extremity assist Stand to Sit: 6: Modified independent (Device/Increase time);Without upper extremity assist  Trunk/Postural Assessment  Cervical Assessment Cervical Assessment: Exceptions to [facility] (pt maintains cervical flexion and protraction.  Limited AROM cervical extension as well as rotation and lateral flexion to both sides) Thoracic Assessment Thoracic Assessment: Within Functional Limits Lumbar Assessment Lumbar Assessment: Within Functional Limits Postural Control Postural Limitations: Pt with increased left  trunk flexion during stance phase with ambulation.    Balance Balance Balance Assessed: Yes Dynamic Sitting Balance Dynamic Sitting - Balance Support: No upper extremity supported Dynamic Sitting - Level of Assistance: 7: Independent Static Standing Balance Static Standing - Balance Support: No upper extremity supported Static Standing - Level of Assistance: 7: Independent Dynamic Standing Balance Dynamic Standing - Level of Assistance: 6: Modified independent (Device/Increase time) Dynamic Standing - Balance Activities: Other (comment) (During selfcare tasks in standing) Extremity/Trunk Assessment RUE Assessment RUE Assessment: Within Functional Limits LUE  Assessment LUE Assessment: Exceptions to [facility] LUE Strength LUE Overall Strength Comments: Pt currently Brunstrum stage V in the left arm and stage VI in the hand.  Pt demonstrates slight left shoulder hike with flexion but overall is absent of synergy pattern with bathing, dressing, grooming tasks.  He is able to tie his shoes but needs increased time with opposition of thumb to all digits present.   See FIM for current functional status  MCGUIRE,JAMES OTR/L 02/14/2015, 10:53 AM

## 2015-02-14 NOTE — Progress Notes (Signed)
Social Work Discharge Note Discharge Note  The overall goal for the admission was met for:   Discharge location: Yes-HOME WITH MOM WHO CAN PROVIDE INTERMITTENT ASSIST  Length of Stay: Yes-7 DAYS  Discharge activity level: Yes-MOD/I-SUPERVISION LEVEL  Home/community participation: Yes  Services provided included: MD, RD, PT, OT, SLP, RN, CM, TR, Pharmacy, Neuropsych and SW  Financial Services: Private Insurance: [facility]  Follow-up services arranged: Home Health: [facility]-PT, OT,RN,SW and Patient/Family has no preference for HH/DME agencies  Comments (or additional information):PT DID WELL HERE AND REACHED GOALS [place]. PT CONCERNED ABOUT OTHER CO-PAYS FOR MEDICINES MOM AND GIRLFREIND CAN ONLY DO INTERMITTENT ASSIST-PT WILL BE ALONE AT TIMES. DECLINED SUBSTANCE ABUSE RESOURCES-PLANS TO RESUME NA MEETINGS. KNOWS TO BE COMPLIANT WITH HIS MEDICINES.  TO FOLLOW UP WITH CHF CLINIC FOR [place].  Patient/Family verbalized understanding of follow-up arrangements: Yes  Individual responsible for coordination of the follow-up plan: SELF & PAT-GIRLFRIEND  Confirmed correct DME delivered: Elease Hashimoto 02/14/2015    Elease Hashimoto

## 2015-02-14 NOTE — Patient Care Conference (Signed)
Inpatient RehabilitationTeam Conference and Plan of Care Update Date: 02/14/2015   Time: 11;00 AM    Patient Name: Dennis [name]      Medical Record Number: [id number]  Date of Birth: December 27, 1960 Sex: Male         Room/Bed: 4W13C/4W13C-01 Payor Info: Payor: [employer] MEDICARE / Plan: [employer] MEDICARE / Product Type: *No Product type* /    Admitting Diagnosis: R CVA  CHF   Admit Date/Time:  02/07/2015  7:01 PM Admission Comments: No comment available   Primary Diagnosis:  <principal problem not specified> Principal Problem: <principal problem not specified>  Patient Active Problem List   Diagnosis Date Noted  . Acute CVA (cerebrovascular accident) 02/07/2015  . HLD (hyperlipidemia)   . Status post PICC central line placement   . History of ischemic right MCA stroke   . Type 2 diabetes mellitus with complication   . Acute on chronic systolic congestive heart failure   . Stroke   . Stroke with cerebral ischemia   . CVA (cerebral infarction) 01/26/2015  . Left-sided weakness   . Embolic stroke involving right middle cerebral artery   . Cardiac LV ejection fraction 10-20%   . LV non-compaction cardiomyopathy   . Acute systolic heart failure 36/14/4315  . Acute ischemic stroke 01/24/2015  . Cocaine abuse 01/24/2015  . H/O noncompliance with medical treatment, presenting hazards to health 01/24/2015  . Tobacco abuse 01/24/2015  . Essential hypertension 01/24/2015  . Hypotension 01/24/2015  . Hyperlipidemia 01/24/2015  . Dysphagia following cerebral infarction 01/24/2015  . Hemiplegia affecting left nondominant side 01/24/2015  . Diabetes mellitus type 2, uncontrolled, with complications 40/05/6760  . Hyperlipidemia associated with type 2 diabetes mellitus 11/30/2014  . CAD (coronary artery disease) 11/16/2014  . Acute on chronic systolic CHF (congestive heart failure) 09/21/2014  . Chest pain on breathing 09/21/2014  . Chronic systolic heart failure 95/06/3266  . Acute on chronic  systolic heart failure   . Automatic implantable cardioverter-defibrillator in situ   . Left ventricular noncompaction 05/27/2014  . Dyspnea 05/26/2014  . Chest discomfort 08/09/2013  . Long term (current) use of anticoagulants 08/09/2013  . Drug abuse and dependence   . CHF (congestive heart failure)   . BP (high blood pressure) 03/04/2013  . Cardiomyopathy, ischemic 07/21/2011  . Type II diabetes mellitus with ophthalmic manifestations 09/13/2007  . TOBACCO ABUSE 09/13/2007  . WEIGHT LOSS, ABNORMAL 09/13/2007  . SOB 09/13/2007    Expected Discharge Date: Expected Discharge Date: 02/14/15  Team Members Present: Physician leading conference: Dr. Alysia Penna Social Worker Present: Ovidio Kin, LCSW Nurse Present: Dorien Chihuahua, RN PT Present: Raylene Everts, PT;Rodney Annie Main, PT OT Present: Simonne Come, Maryella Shivers, OT SLP Present: Windell Moulding, SLP PPS Coordinator present : Daiva Nakayama, RN, CRRN     Current Status/Progress Goal Weekly Team Focus  Medical   Pt with chronic non compliance         Bowel/Bladder     cont B & B        Swallow/Nutrition/ Hydration     na        ADL's   modified independent for all selfcare tasks.  slight decreased divided attention as well as impaired LUE coordination both FM and gross motor  modified independent overall  selfcare re-training, transfer traning, balance re-training, therapeutic exercise, pt/family education   Mobility      Mod I bed mobility, transfers, and gait x 150' 50' home, 300' community, up/down 12 steps  high level  balance, activity tolerance, neuro re-ed for L hip   Communication     na        Safety/Cognition/ Behavioral Observations  Mod I  Mod I   pt at baseline for cognition, recommend discharge from ST services    Pain     no pain issues        Skin     no skin issues           *See Care Plan and progress notes for long and short-term goals.  Barriers to Discharge: None except  some meds may not have adequate insurance coverage    Possible Resolutions to Barriers:  d/c planning for D/C today    Discharge Planning/Teaching Needs:  Home with Mom who can provide intermittent assist-girlfriend also involved, working on medicines cost-does have insurance coverage      Team Discussion:  Reached mod/i level goals, working on medicines payment-aware to follow up with CHF clinic regarding corlanor. Aware how important it is to take meds and be compliant. Not interested in substance abuse services. Feels ready to go home.  Revisions to Treatment Plan:  DC today   Continued Need for Acute Rehabilitation Level of Care: The patient requires daily medical management by a physician with specialized training in physical medicine and rehabilitation for the following conditions: Daily direction of a multidisciplinary physical rehabilitation program to ensure safe treatment while eliciting the highest outcome that is of practical value to the patient.: Yes Daily medical management of patient stability for increased activity during participation in an intensive rehabilitation regime.: Yes Daily analysis of laboratory values and/or radiology reports with any subsequent need for medication adjustment of medical intervention for : Neurological problems  Dupree, Gardiner Rhyme 02/14/2015, 1:16 PM

## 2015-02-14 NOTE — Telephone Encounter (Signed)
Recd prior authorization approval for patient Corlanor 7.5 mg tables GPI/NDC: [id number] good from 02/14/2015 thru 10/13/2015

## 2015-02-16 ENCOUNTER — Ambulatory Visit (HOSPITAL_COMMUNITY): Payer: Self-pay

## 2015-02-19 ENCOUNTER — Ambulatory Visit (HOSPITAL_COMMUNITY): Payer: Self-pay

## 2015-02-20 ENCOUNTER — Telehealth: Payer: Self-pay | Admitting: *Deleted

## 2015-02-20 ENCOUNTER — Ambulatory Visit (INDEPENDENT_AMBULATORY_CARE_PROVIDER_SITE_OTHER): Payer: Medicare Other | Admitting: Cardiovascular Disease

## 2015-02-20 DIAGNOSIS — I428 Other cardiomyopathies: Secondary | ICD-10-CM

## 2015-02-20 LAB — POCT INR: INR: 5.6

## 2015-02-20 MED ORDER — WARFARIN SODIUM 5 MG PO TABS: 5.0000 mg | ORAL_TABLET | Freq: Every day | ORAL | Status: DC

## 2015-02-20 NOTE — Telephone Encounter (Signed)
[facility] called to lets [place] know the patients PT will be delayed a week due to his INR being 5.6.  Will recheck in 1 week and let us know if therapy can resume at that time

## 2015-02-21 ENCOUNTER — Inpatient Hospital Stay: Payer: Medicare Other | Admitting: Family

## 2015-02-21 ENCOUNTER — Ambulatory Visit (HOSPITAL_COMMUNITY): Payer: Self-pay

## 2015-02-21 ENCOUNTER — Telehealth (HOSPITAL_COMMUNITY): Payer: Self-pay | Admitting: *Deleted

## 2015-02-21 ENCOUNTER — Inpatient Hospital Stay (HOSPITAL_COMMUNITY): Admit: 2015-02-21 | Payer: Medicare Other

## 2015-02-21 DIAGNOSIS — Z0289 Encounter for other administrative examinations: Secondary | ICD-10-CM

## 2015-02-21 NOTE — Telephone Encounter (Signed)
Aimee, RN states she was seeing pt yesterday and reviewed his meds and found some discrepancies.  She states pt did not have Corlanor and was taking his Carvedilol 3.125 mg Twice daily.  She states BP 100/70 HR 88.  She advised pt to bring all medications to appt for [place] to review at appt today.

## 2015-02-23 ENCOUNTER — Ambulatory Visit (HOSPITAL_COMMUNITY): Payer: Self-pay

## 2015-02-26 ENCOUNTER — Ambulatory Visit (HOSPITAL_COMMUNITY): Payer: Self-pay

## 2015-02-27 ENCOUNTER — Telehealth: Payer: Self-pay | Admitting: Cardiology

## 2015-02-27 ENCOUNTER — Ambulatory Visit (INDEPENDENT_AMBULATORY_CARE_PROVIDER_SITE_OTHER): Payer: Medicare Other | Admitting: Internal Medicine

## 2015-02-27 ENCOUNTER — Ambulatory Visit (INDEPENDENT_AMBULATORY_CARE_PROVIDER_SITE_OTHER): Payer: Medicare Other | Admitting: Family

## 2015-02-27 ENCOUNTER — Encounter: Payer: Medicare Other | Admitting: *Deleted

## 2015-02-27 ENCOUNTER — Encounter: Payer: Self-pay | Admitting: Family

## 2015-02-27 VITALS — BP 108/70 | HR 102 | Temp 98.2°F | Resp 18 | Ht 72.0 in | Wt 157.0 lb

## 2015-02-27 DIAGNOSIS — S91309A Unspecified open wound, unspecified foot, initial encounter: Secondary | ICD-10-CM | POA: Insufficient documentation

## 2015-02-27 DIAGNOSIS — I428 Other cardiomyopathies: Secondary | ICD-10-CM

## 2015-02-27 DIAGNOSIS — I10 Essential (primary) hypertension: Secondary | ICD-10-CM

## 2015-02-27 DIAGNOSIS — E118 Type 2 diabetes mellitus with unspecified complications: Secondary | ICD-10-CM

## 2015-02-27 DIAGNOSIS — S91302D Unspecified open wound, left foot, subsequent encounter: Secondary | ICD-10-CM

## 2015-02-27 DIAGNOSIS — I639 Cerebral infarction, unspecified: Secondary | ICD-10-CM | POA: Diagnosis not present

## 2015-02-27 HISTORY — DX: Unspecified open wound, unspecified foot, initial encounter: S91.309A

## 2015-02-27 LAB — POCT INR: INR: 2

## 2015-02-27 NOTE — Assessment & Plan Note (Signed)
Diabetes remains uncontrolled with A1c of 13.3. Currently taking 30 units of Lantus nightly but not using NovoLog. Continue current dosage of Lantus. Refer to endocrinology for assistance in management with NovoLog. Continue to take blood sugars 2 times daily. Patient educated regarding lancet use and notes he has enough supplies to complete tasks. Follow-up pending endocrinology evaluation.

## 2015-02-27 NOTE — Patient Instructions (Signed)
Thank you for choosing [facility].  Summary/Instructions:  Please follow up with cardiology and neurology as scheduled  A referral for endocrinology has been sent and you should hear back regarding scheduling an appointment.   If your symptoms worsen or fail to improve, please contact our office for further instruction, or in case of emergency go directly to the emergency room at the closest medical facility.

## 2015-02-27 NOTE — Telephone Encounter (Signed)
Spoke with pt and reminded pt of remote transmission that is due today. Pt stated that he never received home monitor. After investigating this issue I found that pt was enrolled at another clinic. Pt stated that his therapist was at his home and ask if I would call him back later. I informed pt that I would call him back later today.

## 2015-02-27 NOTE — Assessment & Plan Note (Signed)
Hypertension is well controlled with current regimen and below goal of 140/90. Continue current dosages of spiranolactone and digoxin. Changes per cardiology.

## 2015-02-27 NOTE — Assessment & Plan Note (Signed)
2 cm well healing wound on dorsum of left foot. Continue to keep clean and dry with soap and water as needed. No evidence of infection. Continue to monitor at this time.

## 2015-02-27 NOTE — Progress Notes (Signed)
Pre visit review using our clinic review tool, if applicable. No additional management support is needed unless otherwise documented below in the visit note. 

## 2015-02-27 NOTE — Assessment & Plan Note (Signed)
Stable with current regimen and no evidence of residual symptoms. Strength and function are appropriate for independent function based on assessment today and rehab evaluation. Follow up with neurology. Discussed risk factor reduction including heart failure, diabetes, and cocaine use. He is anticoagulated with warfarin and maintained by cardiology.

## 2015-02-27 NOTE — Progress Notes (Signed)
Subjective:    Patient ID: Dennis [name], male    DOB: 1961/02/11, 54 y.o.   MRN: [id number]  Chief Complaint  Patient presents with  . Hospitalization Follow-up    was in the hospital for a mild stroke, he states he is feeling better    HPI:  Dennis [name] is a 54 y.o. male with a PMH of multiple medical conditions who presents today for an office visit following hospitalization.  Recently admitted to the hospital after being found by his family with left sided facial droop and inability to use his left side. He had been non-compliant with his medication and positive for cocaine at the time. CT scan showed possible acute ischemia. His glucose was also noted to be 536. CT showed evolving anterior division right MCA infarct and possible thromboembolic disease at the proximal right M2 branch. He was discharged to rehab and then discharged home following 1 week of therapy with improved status. All hospital notes, images and records were reviewed in detail.  Since leaving the hospital he indicates that he is feeling better and continues to work with the home health nurses and physical therapy. He is currently awaiting assessment by occupational therapy. Indicates that physical therapy is indicating that he is improved enough to no longer need home therapy. He is back on his anticoagulation with warfarin and is being managed by Cardiology and the Coumadin Clinic.   1) Type 2 diabetes - Has not been able to check his blood sugar secondary to his lancets not working since leaving the hospital. Indicates that he takes the 30 units of Lanuts each night and has not been using the Novolog. His last A1c in the hospital reveals an A1c of 13.3.   Lab Results  Component Value Date   HGBA1C 13.3* 01/24/2015   2) Sore on foot - Associated symptom of sore located on the top of his right foot that has been there since leaving the hospital. Denies pain. Is concerned for it healing properly.    No Known  Allergies  Current Outpatient Prescriptions on File Prior to Visit  Medication Sig Dispense Refill  . acetaminophen (TYLENOL) 500 MG tablet Take 1 tablet (500 mg total) by mouth every 6 (six) hours as needed for moderate pain. 30 tablet 0  . atorvastatin (LIPITOR) 20 MG tablet Take 1 tablet (20 mg total) by mouth daily at 6 PM. 30 tablet 3  . digoxin (LANOXIN) 0.25 MG tablet Take 1 tablet (0.25 mg total) by mouth daily. 30 tablet 0  . glucose blood (ONETOUCH VERIO) test strip Use as instructed 100 each 12  . hydrocerin (EUCERIN) CREA Apply 1 application topically 2 (two) times daily.  0  . insulin aspart (NOVOLOG FLEXPEN) 100 UNIT/ML FlexPen Use as directed. 15 mL 11  . Insulin Glargine (LANTUS) 100 UNIT/ML Solostar Pen Inject 30 Units into the skin daily at 10 pm. Dispense pen and needles 15 mL 1  . ivabradine (CORLANOR) 7.5 MG TABS tablet Take 1 tablet (7.5 mg total) by mouth 2 (two) times daily with a meal. 60 tablet 1  . QC PEN NEEDLES 31G X 6 MM MISC 1 each by Other route See admin instructions. Use daily with insulin.    Marland Kitchen senna-docusate (SENOKOT-S) 8.6-50 MG per tablet Take 2 tablets by mouth 2 (two) times daily. 100 tablet 1  . spironolactone (ALDACTONE) 25 MG tablet Take 0.5 tablets (12.5 mg total) by mouth daily. 30 tablet 1  . warfarin (COUMADIN) 5 MG tablet Take  1 tablet (5 mg total) by mouth daily. 45 tablet 1   No current facility-administered medications on file prior to visit.    Past Medical History  Diagnosis Date  . Chronic systolic heart failure     a) Mixed ICM/NICM b) RHC (05/2014): RA 2, RV 19/2/3, PA 22/14 (18), PCWP 6, Fick CO/CI: 5.2 / 2.7, PVR 2.3 WU, PA 60% and 64% c) ECHO (05/2014): EF 20-25%, diff HK, akinesis entireanteroseptal myocardium, triv AI, mod MR, LA mod/sev dilated  . Automatic implantable cardioverter-defibrillator in situ   . Heart murmur   . Sleep apnea     "cleared after T&A"  . Left ventricular noncompaction   . Ischemic cardiomyopathy     a)  Coronary angiography (12/2008) at [facility]: Lmain: nl, LAD mid 100% stenosis with left to left and right-to-left collaterals to the distal LAD; Lcx: nl, RCA nl.    . Type II diabetes mellitus   . High cholesterol   . Pneumonia 1988  . Drug abuse and dependence   . CHF (congestive heart failure)      Review of Systems  Constitutional: Negative for fever and chills.  Respiratory: Negative for cough, chest tightness and shortness of breath.   Cardiovascular: Negative for chest pain, palpitations and leg swelling.  Musculoskeletal: Negative for myalgias.  Skin: Positive for wound.  Neurological: Negative for facial asymmetry, weakness and numbness.      Objective:    BP 108/70 mmHg  Pulse 102  Temp(Src) 98.2 F (36.8 C) (Oral)  Resp 18  Ht 6' (1.829 m)  Wt 157 lb (71.215 kg)  BMI 21.29 kg/m2  SpO2 99% Nursing note and vital signs reviewed.  Physical Exam  Constitutional: He is oriented to person, place, and time. He appears well-developed and well-nourished. No distress.  Cardiovascular: Normal rate, regular rhythm, normal heart sounds and intact distal pulses.   Pulmonary/Chest: Effort normal and breath sounds normal.  Musculoskeletal:  Left upper extremity - No obvious deformity, discoloration or edema noted. Distal pulses and sensation are intact and appropriate. Muscle strength is 4+/5 in all muscle groups tested and comparable to contralateral side.  Left Lower Extremity: No obvious deformity, discoloration or edema noted. Distal pulses and sensation are intact and appropriate. Muscle strength is 4+/5 in all muscle groups tested and comparable to contralateral side.   Neurological: He is alert and oriented to person, place, and time. He has normal reflexes. No cranial nerve deficit. He exhibits normal muscle tone.  Skin: Skin is warm and dry.  Approximately 2 cm wound on the dorsum of the right foot with good healing and epithelialization. No discharge or evidence of infection.    Psychiatric: He has a normal mood and affect. His behavior is normal. Judgment and thought content normal.       Assessment & Plan:

## 2015-02-27 NOTE — Telephone Encounter (Signed)
Follow Up  Pt returning Barbara's phone call. Please call back and dsicuss.

## 2015-02-27 NOTE — Telephone Encounter (Signed)
Spoke w/ pt and informed him to call previous caregiver and have them release him from [facility] website to our clinic. Until that is done we can not order pt a new monitor. Pt verbalized understanding.

## 2015-02-28 ENCOUNTER — Encounter: Payer: Self-pay | Admitting: Licensed Clinical Social Worker

## 2015-02-28 ENCOUNTER — Ambulatory Visit (HOSPITAL_COMMUNITY): Payer: Self-pay

## 2015-02-28 ENCOUNTER — Ambulatory Visit (HOSPITAL_COMMUNITY)
Admission: RE | Admit: 2015-02-28 | Discharge: 2015-02-28 | Disposition: A | Discharge status: Home / Self Care | Payer: Medicare Other | Source: Ambulatory Visit | Attending: Internal Medicine | Admitting: Internal Medicine

## 2015-02-28 VITALS — BP 90/48 | HR 109 | Wt 157.0 lb

## 2015-02-28 DIAGNOSIS — I2510 Atherosclerotic heart disease of native coronary artery without angina pectoris: Secondary | ICD-10-CM | POA: Insufficient documentation

## 2015-02-28 DIAGNOSIS — Z79899 Other long term (current) drug therapy: Secondary | ICD-10-CM | POA: Insufficient documentation

## 2015-02-28 DIAGNOSIS — I5022 Chronic systolic (congestive) heart failure: Secondary | ICD-10-CM | POA: Insufficient documentation

## 2015-02-28 DIAGNOSIS — E119 Type 2 diabetes mellitus without complications: Secondary | ICD-10-CM | POA: Diagnosis not present

## 2015-02-28 DIAGNOSIS — Z95810 Presence of automatic (implantable) cardiac defibrillator: Secondary | ICD-10-CM | POA: Insufficient documentation

## 2015-02-28 DIAGNOSIS — I255 Ischemic cardiomyopathy: Secondary | ICD-10-CM | POA: Insufficient documentation

## 2015-02-28 DIAGNOSIS — Z833 Family history of diabetes mellitus: Secondary | ICD-10-CM | POA: Diagnosis not present

## 2015-02-28 DIAGNOSIS — F17210 Nicotine dependence, cigarettes, uncomplicated: Secondary | ICD-10-CM | POA: Insufficient documentation

## 2015-02-28 DIAGNOSIS — Z8673 Personal history of transient ischemic attack (TIA), and cerebral infarction without residual deficits: Secondary | ICD-10-CM | POA: Diagnosis not present

## 2015-02-28 DIAGNOSIS — Z794 Long term (current) use of insulin: Secondary | ICD-10-CM | POA: Insufficient documentation

## 2015-02-28 DIAGNOSIS — E780 Pure hypercholesterolemia: Secondary | ICD-10-CM | POA: Insufficient documentation

## 2015-02-28 DIAGNOSIS — Z7901 Long term (current) use of anticoagulants: Secondary | ICD-10-CM | POA: Insufficient documentation

## 2015-02-28 DIAGNOSIS — Z8249 Family history of ischemic heart disease and other diseases of the circulatory system: Secondary | ICD-10-CM | POA: Diagnosis not present

## 2015-02-28 LAB — BASIC METABOLIC PANEL
Anion gap: 11 (ref 5–15)
BUN: 14 mg/dL (ref 6–20)
CO2: 25 mmol/L (ref 22–32)
Calcium: 9.4 mg/dL (ref 8.9–10.3)
Chloride: 102 mmol/L (ref 101–111)
Creatinine, Ser: 1.29 mg/dL — ABNORMAL HIGH (ref 0.61–1.24)
GFR calc Af Amer: >60 mL/min (ref >60)
GFR calc non Af Amer: >60 mL/min (ref >60)
Glucose, Bld: 150 mg/dL — ABNORMAL HIGH (ref 65–99)
Potassium: 3.9 mmol/L (ref 3.5–5.1)
Sodium: 138 mmol/L (ref 135–145)

## 2015-02-28 LAB — DIGOXIN LEVEL: Digoxin Level: 0.6 ng/mL — ABNORMAL LOW (ref 0.8–2.0)

## 2015-02-28 MED ORDER — LOSARTAN POTASSIUM 25 MG PO TABS: 12.5000 mg | ORAL_TABLET | Freq: Every day | ORAL | Status: DC

## 2015-02-28 NOTE — Progress Notes (Signed)
ADVANCED HEART FAILURE CLINIC NOTE  Patient ID: Dennis [name], male   DOB: 10/29/1960, 54 y.o.   MRN: [id number]  HPI: Dennis [name] is a 54 y/o male with h/o DM2, polysubstance use (tobacco, cocaine), mixed ICM/NICM and chronic systolic HF due to ventricular noncompaction- on chronic coumadin, noncompliance   Admitted to [facility] in 4/16 with acute CVA and with L sided weakness. CT showed R MCA stroke. Did not receive TPA due to delay in arrival. Had ECHO that showed EF 10-15% mild RV HK. No obvious LV clot. As he improved he was transferred to CIR but then developed hypotension and transferred back to HF ward. Underwent RHC on 4/27. Filling pressures mildly elevated CO ok. Diuresed gently. Meds limited by SBP in 80s. Could only tolerate ivabradine 7.5 bid, digoxin and spiro 12.5. Lasix and carvedilol stopped.   Since discharge doing better. Strength improved. Able to ambulate without much difficulty. No orthopnea, PND or dizziness. Weight stable at 155-157. Was unable to fill ivabradine.    RA = 3 RV = 50/4/7 PA = 55/14 (34) PCW = 22 Fick cardiac output/index = 4.6/2.4 Thermodilution CO/CI = 4.8/2.5 PVR = 2.5 WU SVR = 1443 FA sat = 96% PA sat = 51%, 56%, 61% Noninvasive Ao pressure = 106/74 (86)   This week developed dyspnea and fatigue and transferred back to acute for suspected lowput HF. PICC placed and CVP and co-ox ok but BP too low to titrate HF meds. Ivabradine started. BP at home 100-108. Has HHRN and HHPT. HHRN checked INR 2.0. Dr. Tanna [name] office setting up remote monitoring of ICD.     Past Medical History  Diagnosis Date  . Chronic systolic heart failure     a) Mixed ICM/NICM b) RHC (05/2014): RA 2, RV 19/2/3, PA 22/14 (18), PCWP 6, Fick CO/CI: 5.2 / 2.7, PVR 2.3 WU, PA 60% and 64% c) ECHO (05/2014): EF 20-25%, diff HK, akinesis entireanteroseptal myocardium, triv AI, mod MR, LA mod/sev dilated  . Automatic implantable cardioverter-defibrillator in situ   . Heart murmur   . Sleep apnea       "cleared after T&A"  . Left ventricular noncompaction   . Ischemic cardiomyopathy     a) Coronary angiography (12/2008) at [facility]: Lmain: nl, LAD mid 100% stenosis with left to left and right-to-left collaterals to the distal LAD; Lcx: nl, RCA nl.    . Type II diabetes mellitus   . High cholesterol   . Pneumonia 1988  . Drug abuse and dependence   . CHF (congestive heart failure)     Current Outpatient Prescriptions  Medication Sig Dispense Refill  . acetaminophen (TYLENOL) 500 MG tablet Take 1 tablet (500 mg total) by mouth every 6 (six) hours as needed for moderate pain. 30 tablet 0  . atorvastatin (LIPITOR) 20 MG tablet Take 1 tablet (20 mg total) by mouth daily at 6 PM. 30 tablet 3  . digoxin (LANOXIN) 0.25 MG tablet Take 1 tablet (0.25 mg total) by mouth daily. 30 tablet 0  . glucose blood (ONETOUCH VERIO) test strip Use as instructed 100 each 12  . hydrocerin (EUCERIN) CREA Apply 1 application topically 2 (two) times daily.  0  . insulin aspart (NOVOLOG FLEXPEN) 100 UNIT/ML FlexPen Use as directed. 15 mL 11  . Insulin Glargine (LANTUS) 100 UNIT/ML Solostar Pen Inject 30 Units into the skin daily at 10 pm. Dispense pen and needles 15 mL 1  . QC PEN NEEDLES 31G X 6 MM MISC 1 each by Other route  See admin instructions. Use daily with insulin.    Marland Kitchen senna-docusate (SENOKOT-S) 8.6-50 MG per tablet Take 2 tablets by mouth 2 (two) times daily. 100 tablet 1  . spironolactone (ALDACTONE) 25 MG tablet Take 0.5 tablets (12.5 mg total) by mouth daily. 30 tablet 1  . warfarin (COUMADIN) 5 MG tablet Take 1 tablet (5 mg total) by mouth daily. 45 tablet 1  . ivabradine (CORLANOR) 7.5 MG TABS tablet Take 1 tablet (7.5 mg total) by mouth 2 (two) times daily with a meal. (Patient not taking: Reported on 02/28/2015) 60 tablet 1   No current facility-administered medications for this encounter.    No Known Allergies    History   Social History  . Marital Status: Single    Spouse Name: N/A  .  Number of Children: 1  . Years of Education: 14   Occupational History  . Disabled    Social History Main Topics  . Smoking status: Current Some Day Smoker -- 0.10 packs/day for 31 years    Types: Cigarettes  . Smokeless tobacco: Never Used  . Alcohol Use: 0.0 oz/week    0 Standard drinks or equivalent per week     Comment: 09/21/2014 "might have a drink q 6 /months, if that"  . Drug Use: No     Comment: 09/21/2014 "last cocaine ~ 1 1/2 months ago"  . Sexual Activity: Yes   Other Topics Concern  . Not on file   Social History Narrative   Lives in [place] with his mother. No pets. Fun: movies, sports, daughter.    Denies religious beliefs that would effect health care.       Family History  Problem Relation Age of Onset  . Diabetes Mother   . Heart disease Mother   . Diabetes Father   . Prostate cancer Father   . Heart disease Father   . Diabetes Brother   . Diabetes Brother     There were no vitals filed for this visit.  PHYSICAL EXAM: General:  Well appearing. No respiratory difficulty HEENT: normal Neck: supple. no JVD. Carotids 2+ bilat; no bruits. No lymphadenopathy or thryomegaly appreciated. Cor: PMI laterally displaced. Regular. Mildly tachy +s3 Lungs: clear Abdomen: soft, nontender, nondistended. No hepatosplenomegaly. No bruits or masses. Good bowel sounds. Extremities: no cyanosis, clubbing, rash, edema Neuro: alert & oriented x 3, cranial nerves grossly intact. moves all 4 extremities w/o difficulty. Affect pleasant.   ASSESSMENT & PLAN: 1. Chronic Systolic Heart Failure- ECHO EF 10-15% with non compaction s/p ICD - [employer] -- Volume status looks good. NYHA II but remains tachycardic and medication titration was limited by hypotension. BP slightly better today. -- Will work on getting ivabradine approved ASAP -- Start losartan 12.5 mg at night as BP tolerated.  -- Use lasix only as need for weight 159 or greater -- Continue dig and spiro. Check  labs today -- Dr. Tanna [name] office following ICD -- Will need close f/u as HF tenuous 2. CVA 3. CAD Coronary angiography (12/2008) with mid LAD totally occluded and collateralized. Continue statin. No ASA he is on coumadin for noncompaction. . 4. DMII  5. Polysubstance abuse - resolved  Bensimhon, Daniel,MD 12:02 PM

## 2015-02-28 NOTE — Progress Notes (Signed)
CSW referred to assist with medication assistance. CSW met with patient who states that he has [employer] and has most of his medications except one which is in process for approval. Patient denies any other concerns at this time and will contact CSW if medication not approved. CSW will continue to be available. Raquel Sarna, [place]

## 2015-02-28 NOTE — Patient Instructions (Signed)
Start Losartan 12.5 mg (1/2 tab) daily  Start back on Ivabradine (Corlanor) 7.5 mg Twice daily, we are working on getting this cheaper for you  Labs today  Your physician recommends that you schedule a follow-up appointment in: 1 month

## 2015-03-02 ENCOUNTER — Encounter: Payer: Self-pay | Admitting: Cardiology

## 2015-03-02 ENCOUNTER — Ambulatory Visit (HOSPITAL_COMMUNITY): Payer: Self-pay

## 2015-03-02 DIAGNOSIS — Z5181 Encounter for therapeutic drug level monitoring: Secondary | ICD-10-CM

## 2015-03-02 DIAGNOSIS — Z9114 Patient's other noncompliance with medication regimen: Secondary | ICD-10-CM

## 2015-03-02 DIAGNOSIS — E119 Type 2 diabetes mellitus without complications: Secondary | ICD-10-CM | POA: Diagnosis not present

## 2015-03-02 DIAGNOSIS — G4733 Obstructive sleep apnea (adult) (pediatric): Secondary | ICD-10-CM | POA: Diagnosis not present

## 2015-03-02 DIAGNOSIS — I5022 Chronic systolic (congestive) heart failure: Secondary | ICD-10-CM | POA: Diagnosis not present

## 2015-03-02 DIAGNOSIS — Z7901 Long term (current) use of anticoagulants: Secondary | ICD-10-CM

## 2015-03-02 DIAGNOSIS — F1920 Other psychoactive substance dependence, uncomplicated: Secondary | ICD-10-CM

## 2015-03-02 DIAGNOSIS — I69354 Hemiplegia and hemiparesis following cerebral infarction affecting left non-dominant side: Secondary | ICD-10-CM | POA: Diagnosis not present

## 2015-03-02 DIAGNOSIS — Z794 Long term (current) use of insulin: Secondary | ICD-10-CM

## 2015-03-06 ENCOUNTER — Ambulatory Visit (INDEPENDENT_AMBULATORY_CARE_PROVIDER_SITE_OTHER): Payer: Medicare Other | Admitting: Interventional Cardiology

## 2015-03-06 DIAGNOSIS — I428 Other cardiomyopathies: Secondary | ICD-10-CM

## 2015-03-06 LAB — POCT INR: INR: 3.7

## 2015-03-08 ENCOUNTER — Ambulatory Visit: Payer: Medicare Other | Admitting: Neurology

## 2015-03-08 ENCOUNTER — Telehealth: Payer: Self-pay | Admitting: Neurology

## 2015-03-08 NOTE — Telephone Encounter (Signed)
This patient did not show for a new patient appointment today. 

## 2015-03-09 ENCOUNTER — Ambulatory Visit: Payer: Medicare Other | Admitting: Endocrinology

## 2015-03-15 ENCOUNTER — Telehealth (HOSPITAL_COMMUNITY): Payer: Self-pay | Admitting: Vascular Surgery

## 2015-03-15 NOTE — Telephone Encounter (Signed)
Spoke w/Amy advised meds are ok to use together however pt's carvedilol was stopped, she is going to d/c pt today

## 2015-03-15 NOTE — Telephone Encounter (Signed)
Nurse from [facility] called she is d/c pt today.. Pt no longer home bound.. She also wants a call back to clarify pt medication pt is taking Coreg and Corlanor .Marland Kitchen Please advise

## 2015-03-16 ENCOUNTER — Inpatient Hospital Stay: Payer: Medicare Other | Admitting: Physical Medicine & Rehabilitation

## 2015-03-16 ENCOUNTER — Encounter: Payer: Medicare Other | Attending: Physical Medicine & Rehabilitation

## 2015-03-19 ENCOUNTER — Ambulatory Visit (INDEPENDENT_AMBULATORY_CARE_PROVIDER_SITE_OTHER): Payer: Medicare Other | Admitting: Internal Medicine

## 2015-03-19 ENCOUNTER — Other Ambulatory Visit (HOSPITAL_COMMUNITY): Payer: Self-pay

## 2015-03-19 ENCOUNTER — Telehealth (HOSPITAL_COMMUNITY): Payer: Self-pay

## 2015-03-19 DIAGNOSIS — I428 Other cardiomyopathies: Secondary | ICD-10-CM

## 2015-03-19 LAB — POCT INR: INR: 2.3

## 2015-03-19 MED ORDER — WARFARIN SODIUM 7.5 MG PO TABS: ORAL_TABLET | ORAL | Status: DC

## 2015-03-19 MED ORDER — FUROSEMIDE 40 MG PO TABS: 40.0000 mg | ORAL_TABLET | Freq: Every day | ORAL | Status: DC | PRN

## 2015-03-19 NOTE — Telephone Encounter (Signed)
Nurse with [facility] called to report patient still taking lasix '40mg'$  every day.  Per Dr. Haroldine Laws should be taking lasix PRN only for weight gain greater than 159 lbs.  RN will make sure this is corrected in the home, as well as to remind him of next appointment.  RN states patient seems cognitively "not with it", that he has no routine to his day, going to bed at 4 am sometimes and sleeping until early afternoon.  Questions if he knows what he is doing with meds, diet, fluid, weights, etc.  Will be a good candidate for paramedicine group as [facility] is discharging him since he is no longer home bound.  Will address at upcoming appointment.  Renee Pain

## 2015-03-20 ENCOUNTER — Encounter: Payer: Self-pay | Admitting: Neurology

## 2015-03-21 ENCOUNTER — Telehealth: Payer: Self-pay

## 2015-03-21 NOTE — Telephone Encounter (Signed)
Call to Mr. Friesen regarding AWV prior to seeing [place] on 6/16/ Apt with Manuela Schwartz at 10:15 and apt with Earnstine Regal. At 11am  Agreed to come in early. Was confused regarding his other apt's so sent him a letter with apt schedule to date.

## 2015-03-29 ENCOUNTER — Telehealth: Payer: Self-pay | Admitting: Family

## 2015-03-29 ENCOUNTER — Encounter: Payer: Medicare Other | Admitting: Family

## 2015-03-29 DIAGNOSIS — Z0289 Encounter for other administrative examinations: Secondary | ICD-10-CM

## 2015-03-29 NOTE — Telephone Encounter (Signed)
Ok to reschedule if he calls back

## 2015-03-29 NOTE — Telephone Encounter (Signed)
Noted  

## 2015-03-29 NOTE — Telephone Encounter (Signed)
Patient no showed for CPE on 6/16.  Please advise.

## 2015-04-02 ENCOUNTER — Inpatient Hospital Stay (HOSPITAL_COMMUNITY): Admission: RE | Admit: 2015-04-02 | Payer: Medicare Other | Source: Ambulatory Visit

## 2015-04-02 ENCOUNTER — Other Ambulatory Visit (HOSPITAL_COMMUNITY): Payer: Self-pay | Admitting: *Deleted

## 2015-04-02 DIAGNOSIS — I5022 Chronic systolic (congestive) heart failure: Secondary | ICD-10-CM

## 2015-04-10 ENCOUNTER — Ambulatory Visit: Payer: Medicare Other | Admitting: Endocrinology

## 2015-04-10 ENCOUNTER — Telehealth: Payer: Self-pay | Admitting: Endocrinology

## 2015-04-10 NOTE — Telephone Encounter (Signed)
Patient no showed today's appt. Please advise on how to follow up. °A. No follow up necessary. °B. Follow up urgent. Contact patient immediately. °C. Follow up necessary. Contact patient and schedule visit in ___ days. °D. Follow up advised. Contact patient and schedule visit in ____weeks. ° °

## 2015-04-10 NOTE — Telephone Encounter (Signed)
No follow up necessary.  

## 2015-04-20 ENCOUNTER — Encounter (HOSPITAL_COMMUNITY): Payer: Medicare Other

## 2015-05-14 ENCOUNTER — Telehealth: Payer: Self-pay

## 2015-05-14 NOTE — Telephone Encounter (Signed)
Call Id: [id number] [place] Patient Name: Dennis [name] Gender: Male DOB: [birth year] Age: 55 Y 21 D Return Phone Number: [phone] (Primary) Address: City/State/Zip:  [employer] [facility] Client Client Site [facility] Contact Type Call Call Type Triage / Clinical Caller Name Bridgette Relationship To Patient Care Giver Return Phone Number [phone] (Primary) Chief Complaint Blood Sugar High Initial Comment Caller states she is Bridgette a NP with home calls and doing a home visit and patient has 4+glucose in his urine, and his blood sugar is 398. can call NP if needed: [phone] Nurse Assessment Nurse: Germain Osgood, RN, Opal Sidles Date/Time Eilene Ghazi Time): 05/12/2015 3:46:30 PM Confirm and document reason for call. If symptomatic, describe symptoms. ---Caller reports she assessed patient Had 4+ glucose in Urine. BS 398 Has order for Novalog sliding scale but reported he has not been taking it because it makes him bottom out. Only went to BS 250-300 9 units, call if >. Takes Lantus 30 units at [facility]. Reports no fever, asymptomatic otherwise, Uncertain as to how long BS elevated due to patient does nto check. Was drinking a fruit punch at Time. Not with patient at this time. Has the patient traveled out of the country within the last 30 days? ---Not Applicable Does the patient require triage? ---No Please document clinical information provided and list any resource used. ---N/A Guidelines Guideline Title Affirmed Question Affirmed Notes Nurse Date/Time ([place] Time) Disp. Time Eilene Ghazi Time) Disposition Final User 05/12/2015 3:51:24 PM Paged On Call back to [facility] Germain Osgood, [place] 05/12/2015 4:31:29 PM Clinical Call Yes Germain Osgood, RN, Opal Sidles After Care Instructions Given Call Event Type User Date / Time Description Comments User: Susanne Greenhouse, RN  Date/Time Eilene Ghazi Time): 05/12/2015 3:41:59 PM Called home number. No message left. PLEASE NOTE: All timestamps contained within this report are represented as [place] Standard Time. CONFIDENTIALTY NOTICE: This fax transmission is intended only for the addressee. It contains information that is legally privileged, confidential or otherwise protected from use or disclosure. If you are not the intended recipient, you are strictly prohibited from reviewing, disclosing, copying using or disseminating any of this information or taking any action in reliance on or regarding this information. If you have received this fax in error, please notify us immediately by telephone so that we can arrange for its return to [place]. Phone: [phone], Toll-Free: [phone], Fax: [phone] Page: 2 of 2 Call Id: [id number] Comments User: Susanne Greenhouse, RN Date/Time Eilene Ghazi Time): 05/12/2015 4:30:20 PM Caller informed of MD Instructions and verbalized understanding. Will instruct patient to take 9 units of his Sliding scale Insulin now. User: Susanne Greenhouse, RN Date/Time Eilene Ghazi Time): 05/12/2015 4:30:58 PM Called caller back to inform her that MD approved another visit if needed and have seen if develops symptoms. Paging DoctorName Phone DateTime Result/Outcome Message Type Notes Colin Benton [phone] 05/12/2015 3:51:24 PM Paged On Call Back to Call Center Doctor Paged please call Minus Liberty @ the call center [phone] Colin Benton 05/12/2015 4:19:12 PM Spoke with On Call - General Message Result Dr. Maudie Mercury informed of elevated glucose in blood and in urine. Recommends since patient has not been taking SS should get back on scale and take highest dose on scale. F/U with endocrinologist. Otherwise to hospital if becomes symptomatic.

## 2015-05-16 ENCOUNTER — Telehealth: Payer: Self-pay

## 2015-05-16 NOTE — Telephone Encounter (Signed)
Call to Dennis [name]; stated call was outreach as he has not made a couple of apt's with Terri Piedra or with cardiology; Referred to echo and the patient stated he had not had anyone discuss with him. (noted apt with [facility] was canceled) The patient was driving and agreed to fup call in 30 to 60 minutes when he is home.

## 2015-05-16 NOTE — Telephone Encounter (Signed)
Call to Mr. Mom, but had to leave a message for call back / Left direct number [phone]

## 2015-05-16 NOTE — Telephone Encounter (Signed)
Call back to [name]; stated that he did not know the results of his echo, but that he did not go to his cardiovascular apt on 6/20 Discussed the need to do so as there may be meds that can help him to feel better.   Agreed to call the [facility] center and make an apt in the am. Will await his outcome from that apt and then will schedule with Terri Piedra as appropriate;

## 2015-05-17 NOTE — Telephone Encounter (Signed)
Patient needs to take his insulin as prescribed and schedule an office visit to discuss if he is having hypoglycemia.

## 2015-05-18 NOTE — Telephone Encounter (Signed)
LVM letting pt know he needs OV

## 2015-06-21 ENCOUNTER — Encounter (HOSPITAL_COMMUNITY): Payer: Self-pay | Admitting: *Deleted

## 2015-06-21 DIAGNOSIS — E780 Pure hypercholesterolemia: Secondary | ICD-10-CM | POA: Diagnosis not present

## 2015-06-21 DIAGNOSIS — Z9889 Other specified postprocedural states: Secondary | ICD-10-CM | POA: Diagnosis not present

## 2015-06-21 DIAGNOSIS — R0602 Shortness of breath: Secondary | ICD-10-CM | POA: Insufficient documentation

## 2015-06-21 DIAGNOSIS — Z79899 Other long term (current) drug therapy: Secondary | ICD-10-CM | POA: Diagnosis not present

## 2015-06-21 DIAGNOSIS — Z720 Tobacco use: Secondary | ICD-10-CM | POA: Insufficient documentation

## 2015-06-21 DIAGNOSIS — R011 Cardiac murmur, unspecified: Secondary | ICD-10-CM | POA: Diagnosis not present

## 2015-06-21 DIAGNOSIS — R5383 Other fatigue: Secondary | ICD-10-CM | POA: Insufficient documentation

## 2015-06-21 DIAGNOSIS — I5022 Chronic systolic (congestive) heart failure: Secondary | ICD-10-CM | POA: Diagnosis not present

## 2015-06-21 DIAGNOSIS — Z95810 Presence of automatic (implantable) cardiac defibrillator: Secondary | ICD-10-CM | POA: Insufficient documentation

## 2015-06-21 DIAGNOSIS — Z794 Long term (current) use of insulin: Secondary | ICD-10-CM | POA: Insufficient documentation

## 2015-06-21 DIAGNOSIS — R42 Dizziness and giddiness: Secondary | ICD-10-CM | POA: Insufficient documentation

## 2015-06-21 DIAGNOSIS — Z8701 Personal history of pneumonia (recurrent): Secondary | ICD-10-CM | POA: Diagnosis not present

## 2015-06-21 DIAGNOSIS — E119 Type 2 diabetes mellitus without complications: Secondary | ICD-10-CM | POA: Insufficient documentation

## 2015-06-21 DIAGNOSIS — Z7901 Long term (current) use of anticoagulants: Secondary | ICD-10-CM | POA: Insufficient documentation

## 2015-06-21 NOTE — ED Notes (Signed)
Pt c/o SOB and weakness starting this evening.

## 2015-06-22 ENCOUNTER — Emergency Department (HOSPITAL_COMMUNITY)
Admission: EM | Admit: 2015-06-22 | Discharge: 2015-06-22 | Disposition: A | Discharge status: Home / Self Care | Payer: Medicare Other | Attending: Emergency Medicine | Admitting: Emergency Medicine

## 2015-06-22 ENCOUNTER — Emergency Department (HOSPITAL_COMMUNITY): Payer: Medicare Other

## 2015-06-22 DIAGNOSIS — R0602 Shortness of breath: Secondary | ICD-10-CM

## 2015-06-22 LAB — URINE MICROSCOPIC-ADD ON

## 2015-06-22 LAB — BASIC METABOLIC PANEL
Anion gap: 9 (ref 5–15)
BUN: 14 mg/dL (ref 6–20)
CO2: 27 mmol/L (ref 22–32)
Calcium: 9.3 mg/dL (ref 8.9–10.3)
Chloride: 95 mmol/L — ABNORMAL LOW (ref 101–111)
Creatinine, Ser: 1.16 mg/dL (ref 0.61–1.24)
GFR calc Af Amer: >60 mL/min (ref >60)
GFR calc non Af Amer: >60 mL/min (ref >60)
Glucose, Bld: 483 mg/dL — ABNORMAL HIGH (ref 65–99)
Potassium: 4.6 mmol/L (ref 3.5–5.1)
Sodium: 131 mmol/L — ABNORMAL LOW (ref 135–145)

## 2015-06-22 LAB — CBC
HCT: 40.2 % (ref 39.0–52.0)
Hemoglobin: 13.7 g/dL (ref 13.0–17.0)
MCH: 30.6 pg (ref 26.0–34.0)
MCHC: 34.1 g/dL (ref 30.0–36.0)
MCV: 89.9 fL (ref 78.0–100.0)
Platelets: 179 10*3/uL (ref 150–400)
RBC: 4.47 MIL/uL (ref 4.22–5.81)
RDW: 12.4 % (ref 11.5–15.5)
WBC: 8.4 10*3/uL (ref 4.0–10.5)

## 2015-06-22 LAB — URINALYSIS, ROUTINE W REFLEX MICROSCOPIC
Bilirubin Urine: NEGATIVE
Glucose, UA: >1000 mg/dL — AB
Hgb urine dipstick: NEGATIVE
Ketones, ur: NEGATIVE mg/dL
Leukocytes, UA: NEGATIVE
Nitrite: NEGATIVE
Protein, ur: NEGATIVE mg/dL
Specific Gravity, Urine: 1.015 (ref 1.005–1.030)
Urobilinogen, UA: 0.2 mg/dL (ref 0.0–1.0)
pH: 5 (ref 5.0–8.0)

## 2015-06-22 LAB — I-STAT TROPONIN, ED: Troponin i, poc: 0.01 ng/mL (ref 0.00–0.08)

## 2015-06-22 LAB — CBG MONITORING, ED: Glucose-Capillary: 319 mg/dL — ABNORMAL HIGH (ref 65–99)

## 2015-06-22 LAB — BRAIN NATRIURETIC PEPTIDE: B Natriuretic Peptide: 252.4 pg/mL — ABNORMAL HIGH (ref 0.0–100.0)

## 2015-06-22 IMAGING — DX DG CHEST 2V
2 series · 2 of 2 positions shown · non-contrast
Comparison: [DATE]

CLINICAL DATA: Shortness of breath and chest pain onset today.
Smoker.

EXAM:
CHEST  2 VIEW

[chest pa]
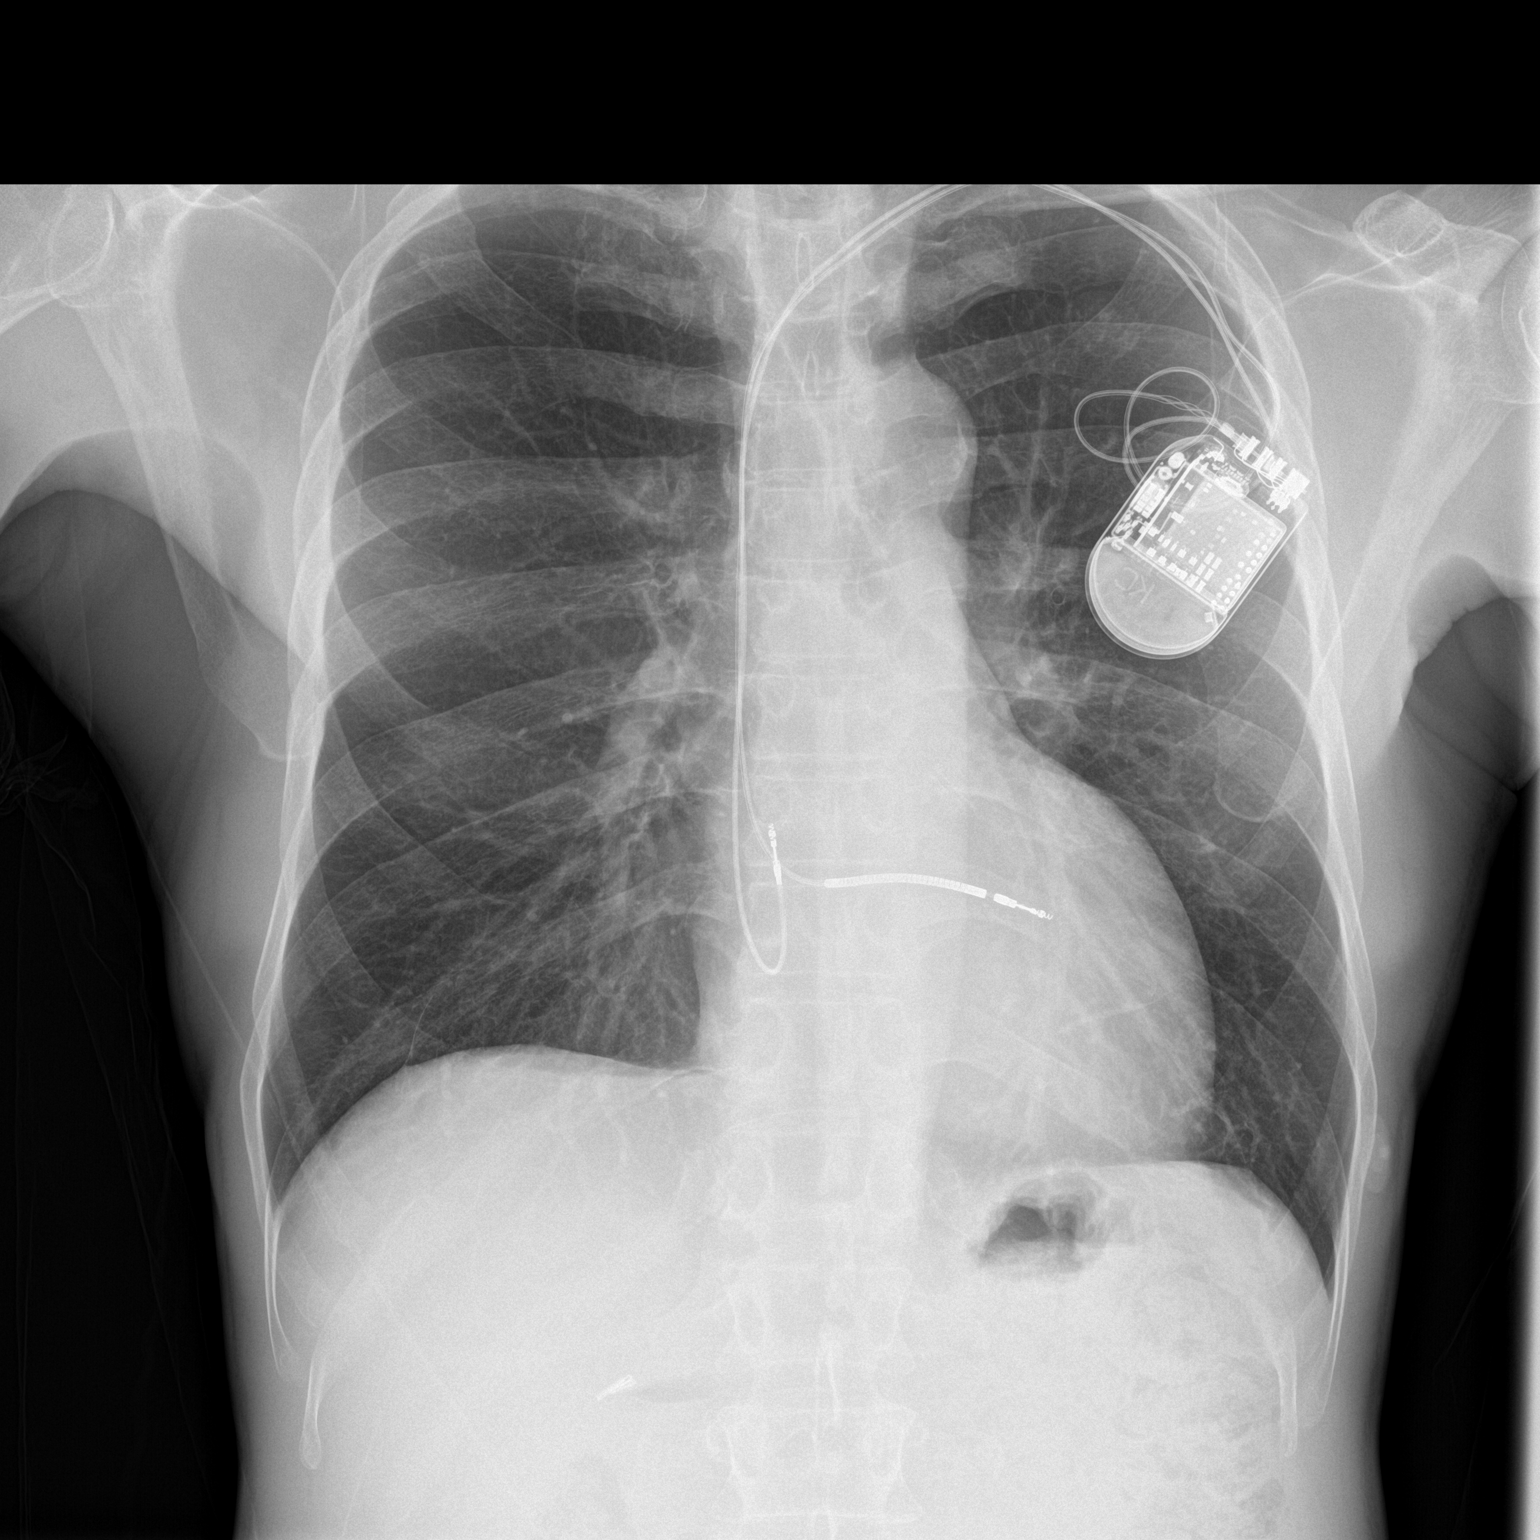

[chest lat]
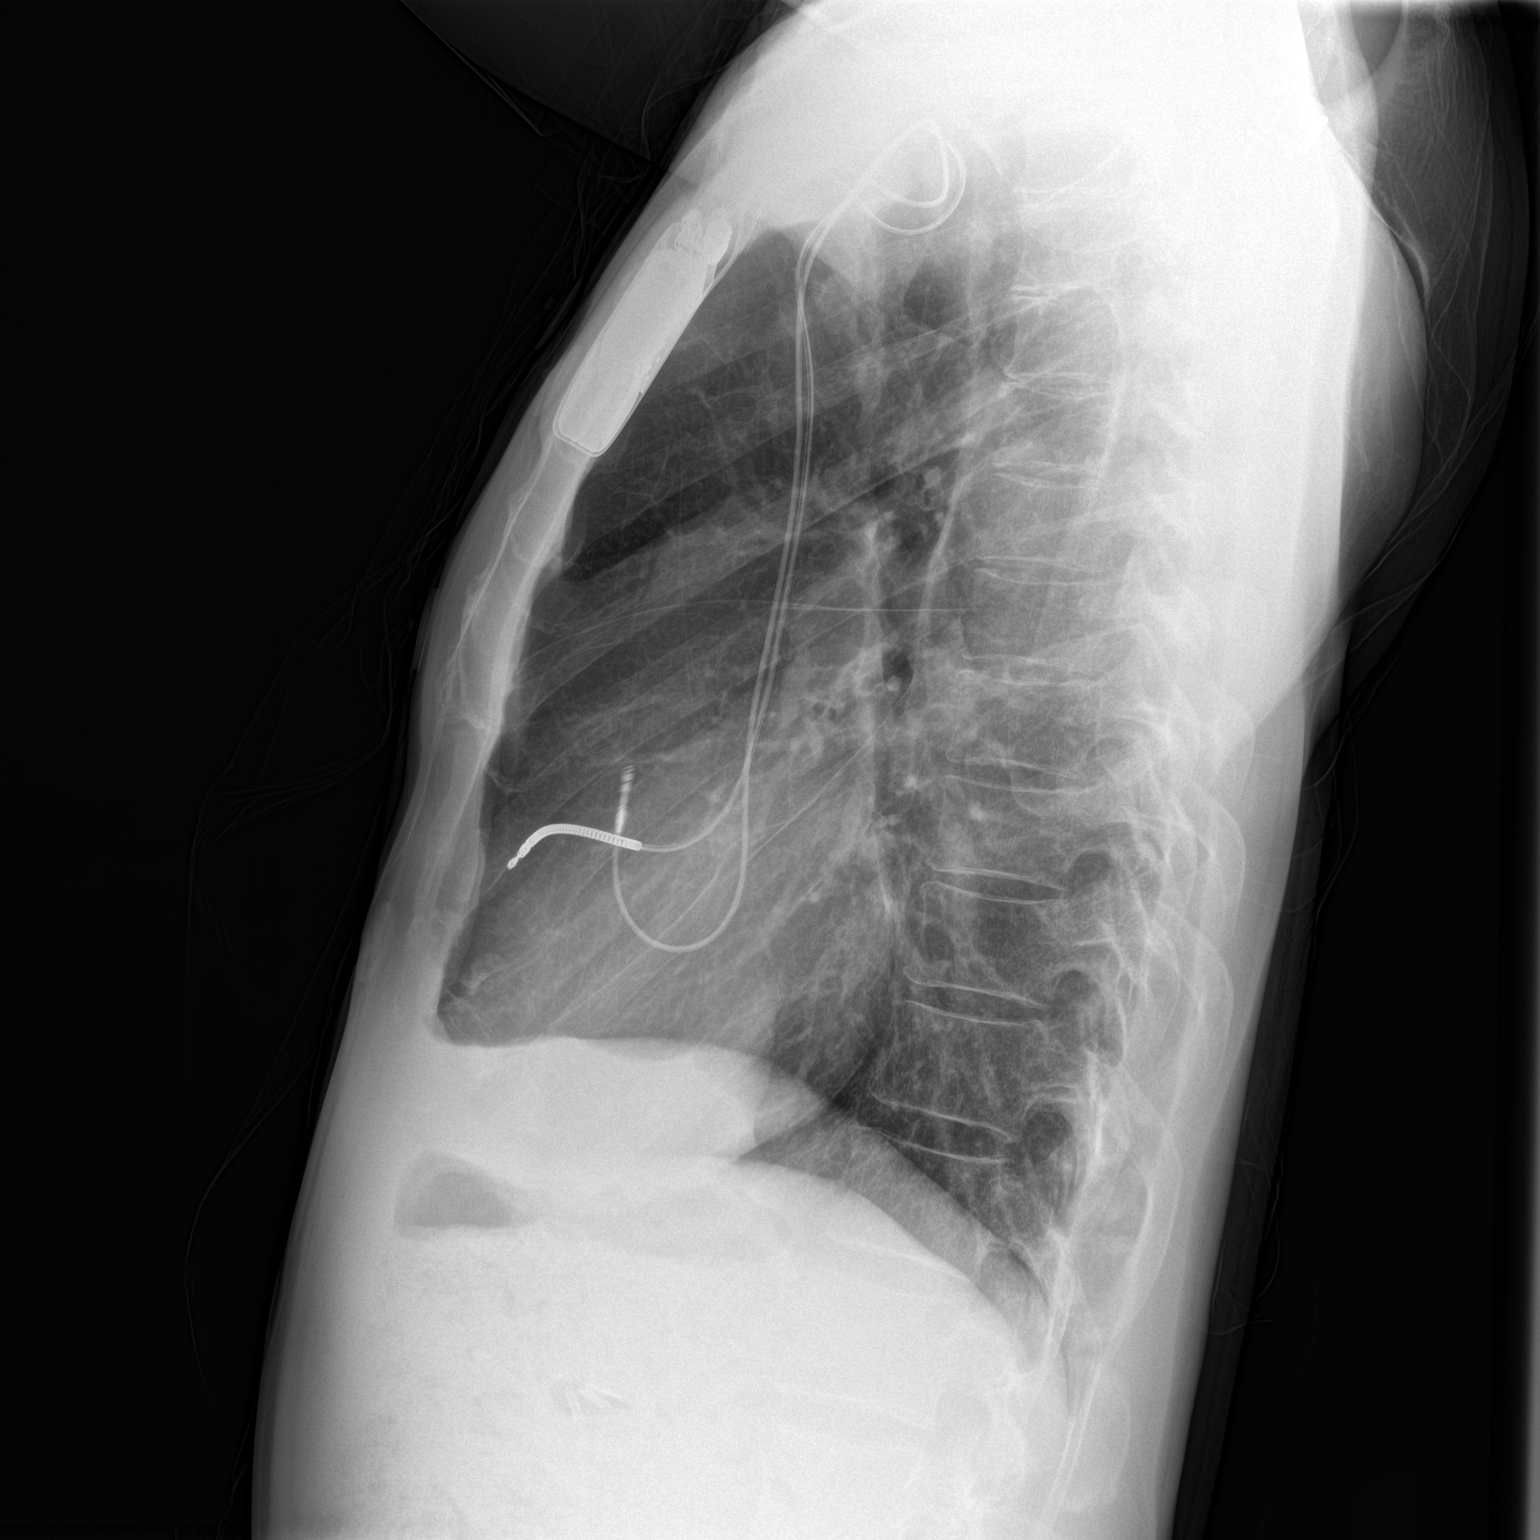

[2 of 2 positions shown; findings below may reference images not displayed]

FINDINGS: Cardiac pacemaker. Mild hyperinflation. Normal heart size and
pulmonary vascularity. No focal airspace disease or consolidation in
the lungs. No blunting of costophrenic angles. No pneumothorax.
Mediastinal contours appear intact.
IMPRESSION: No active cardiopulmonary disease.

## 2015-06-22 MED ORDER — FUROSEMIDE 20 MG PO TABS
40.0000 mg | ORAL_TABLET | Freq: Once | ORAL | Status: AC
  Administered 2015-06-22: 40 mg via ORAL
  Filled 2015-06-22: qty 2

## 2015-06-22 NOTE — ED Notes (Signed)
PT verbalized understanding to follow up with cardiology. Pt stable and NAD.

## 2015-06-22 NOTE — ED Provider Notes (Signed)
CSN: [id number]     Arrival date & time 06/21/15  2347 History   This chart was scribed for Everlene Balls, MD by Randa Evens, ED Scribe. This patient was seen in room A04C/A04C and the patient's care was started at 3:01 AM.    Chief Complaint  Patient presents with  . Shortness of Breath  . Fatigue   Patient is a 54 y.o. male presenting with shortness of breath. The history is provided by the patient. No language interpreter was used.  Shortness of Breath Severity:  Moderate Onset quality:  Gradual Duration:  1 day Relieved by:  None tried Worsened by:  Exertion Associated symptoms: no vomiting    HPI Comments: Dennis [name] is a 54 y.o. male who presents to the Emergency Department complaining of SOB onset 1 day prior. Pt states that he had associated light headedness and dizziness. Pt states that laying flat  makes the SOB worse. He denies BLE edema, but has DOE.  Denies nausea,vomiting, leg swelling. Pt does report CHF. Pt states that he is on Lasix as needed and his last dose was 5 days prior.   Past Medical History  Diagnosis Date  . Chronic systolic heart failure     a) Mixed ICM/NICM b) RHC (05/2014): RA 2, RV 19/2/3, PA 22/14 (18), PCWP 6, Fick CO/CI: 5.2 / 2.7, PVR 2.3 WU, PA 60% and 64% c) ECHO (05/2014): EF 20-25%, diff HK, akinesis entireanteroseptal myocardium, triv AI, mod MR, LA mod/sev dilated  . Automatic implantable cardioverter-defibrillator in situ   . Heart murmur   . Sleep apnea     "cleared after T&A"  . Left ventricular noncompaction   . Ischemic cardiomyopathy     a) Coronary angiography (12/2008) at [facility]: Lmain: nl, LAD mid 100% stenosis with left to left and right-to-left collaterals to the distal LAD; Lcx: nl, RCA nl.    . Type II diabetes mellitus   . High cholesterol   . Pneumonia 1988  . Drug abuse and dependence   . CHF (congestive heart failure)    Past Surgical History  Procedure Laterality Date  . Forearm fracture surgery Left 1980  . Cardiac  defibrillator placement  03/2011  . Cholecystectomy  1994  . Right heart catheterization N/A 05/30/2014    Procedure: RIGHT HEART CATH;  Surgeon: Jolaine Artist, MD;  Location: [facility] C[facility] LAB;  Service: Cardiovascular;  Laterality: N/A;  . Fracture surgery    . Cardiac catheterization  05/2014  . Tonsillectomy and adenoidectomy  ~ 1998  . Right heart catheterization N/A 02/07/2015    Procedure: RIGHT HEART CATH;  Surgeon: Jolaine Artist, MD;  Location: [facility] CATH LAB;  Service: Cardiovascular;  Laterality: N/A;   Family History  Problem Relation Age of Onset  . Diabetes Mother   . Heart disease Mother   . Diabetes Father   . Prostate cancer Father   . Heart disease Father   . Diabetes Brother   . Diabetes Brother    Social History  Substance Use Topics  . Smoking status: Current Some Day Smoker -- 0.10 packs/day for 31 years    Types: Cigarettes  . Smokeless tobacco: Never Used  . Alcohol Use: 0.0 oz/week    0 Standard drinks or equivalent per week     Comment: 09/21/2014 "might have a drink q 6 /months, if that"    Review of Systems  Respiratory: Positive for shortness of breath.   Cardiovascular: Negative for leg swelling.  Gastrointestinal: Negative for nausea  and vomiting.  Neurological: Positive for dizziness and light-headedness.  All other systems reviewed and are negative.     Allergies  Review of patient's allergies indicates no known allergies.  Home Medications   Prior to Admission medications   Medication Sig Start Date End Date Taking? Authorizing Provider  acetaminophen (TYLENOL) 500 MG tablet Take 1 tablet (500 mg total) by mouth every 6 (six) hours as needed for moderate pain. 02/07/15   Florencia Reasons, MD  atorvastatin (LIPITOR) 20 MG tablet Take 1 tablet (20 mg total) by mouth daily at 6 PM. 02/07/15   Florencia Reasons, MD  digoxin (LANOXIN) 0.25 MG tablet Take 1 tablet (0.25 mg total) by mouth daily. 02/07/15   Florencia Reasons, MD  furosemide (LASIX) 40 MG tablet Take 1 tablet  (40 mg total) by mouth daily as needed (weight greater than 159 pounds). 03/19/15   Jolaine Artist, MD  glucose blood [facility] VERIO) test strip Use as instructed 11/30/14   Golden Circle, FNP  hydrocerin (EUCERIN) CREA Apply 1 application topically 2 (two) times daily. 02/13/15   Ivan Anchors Love, PA-C  insulin aspart (NOVOLOG FLEXPEN) 100 UNIT/ML FlexPen Use as directed. 02/14/15   Bary Leriche, PA-C  Insulin Glargine (LANTUS) 100 UNIT/ML Solostar Pen Inject 30 Units into the skin daily at 10 pm. Dispense pen and needles 02/14/15   Ivan Anchors Love, PA-C  ivabradine (CORLANOR) 7.5 MG TABS tablet Take 1 tablet (7.5 mg total) by mouth 2 (two) times daily with a meal. Patient not taking: Reported on 02/28/2015 02/13/15   Ivan Anchors Love, PA-C  losartan (COZAAR) 25 MG tablet Take 0.5 tablets (12.5 mg total) by mouth daily. 02/28/15   Jolaine Artist, MD  QC PEN NEEDLES 31G X 6 MM MISC 1 each by Other route See admin instructions. Use daily with insulin. 08/06/12   Historical Provider, MD  senna-docusate (SENOKOT-S) 8.6-50 MG per tablet Take 2 tablets by mouth 2 (two) times daily. 02/13/15   Bary Leriche, PA-C  spironolactone (ALDACTONE) 25 MG tablet Take 0.5 tablets (12.5 mg total) by mouth daily. 02/13/15   Bary Leriche, PA-C  warfarin (COUMADIN) 7.5 MG tablet Take as directed by coumadin clinic 03/19/15   Jolaine Artist, MD   BP 96/69 mmHg  Pulse 98  Temp(Src) 98.5 F (36.9 C) (Oral)  Resp 18  SpO2 98%   Physical Exam  Constitutional: He is oriented to person, place, and time. Vital signs are normal. He appears well-developed and well-nourished.  Non-toxic appearance. He does not appear ill. No distress.  HENT:  Head: Normocephalic and atraumatic.  Nose: Nose normal.  Mouth/Throat: Oropharynx is clear and moist. No oropharyngeal exudate.  Eyes: Conjunctivae and EOM are normal. Pupils are equal, round, and reactive to light. No scleral icterus.  Neck: Normal range of motion. Neck supple. No tracheal  deviation, no edema, no erythema and normal range of motion present. No thyroid mass and no thyromegaly present.  Cardiovascular: Normal rate, regular rhythm, S1 normal, S2 normal, normal heart sounds, intact distal pulses and normal pulses.  Exam reveals no gallop and no friction rub.   No murmur heard. Pulses:      Radial pulses are 2+ on the right side, and 2+ on the left side.       Dorsalis pedis pulses are 2+ on the right side, and 2+ on the left side.  Pulmonary/Chest: Effort normal and breath sounds normal. No respiratory distress. He has no wheezes. He has no  rhonchi. He has no rales.  Abdominal: Soft. Normal appearance and bowel sounds are normal. He exhibits no distension, no ascites and no mass. There is no hepatosplenomegaly. There is no tenderness. There is no rebound, no guarding and no CVA tenderness.  Musculoskeletal: Normal range of motion. He exhibits no edema or tenderness.  Lymphadenopathy:    He has no cervical adenopathy.  Neurological: He is alert and oriented to person, place, and time. He has normal strength. No cranial nerve deficit or sensory deficit.  Skin: Skin is warm, dry and intact. No petechiae and no rash noted. He is not diaphoretic. No erythema. No pallor.  Psychiatric: He has a normal mood and affect. His behavior is normal. Judgment normal.  Nursing note and vitals reviewed.   ED Course  Procedures (including critical care time) DIAGNOSTIC STUDIES: Oxygen Saturation is 98% on RA, normal by my interpretation.    COORDINATION OF CARE: 3:17 AM-Discussed treatment plan with pt at bedside and pt agreed to plan.     Labs Review Labs Reviewed  BASIC METABOLIC PANEL - Abnormal; Notable for the following:    Sodium 131 (*)    Chloride 95 (*)    Glucose, Bld 483 (*)    All other components within normal limits  URINALYSIS, ROUTINE W REFLEX MICROSCOPIC (NOT AT [facility]) - Abnormal; Notable for the following:    APPearance CLOUDY (*)    Glucose, UA >1000 (*)     All other components within normal limits  BRAIN NATRIURETIC PEPTIDE - Abnormal; Notable for the following:    B Natriuretic Peptide 252.4 (*)    All other components within normal limits  CBG MONITORING, ED - Abnormal; Notable for the following:    Glucose-Capillary 319 (*)    All other components within normal limits  CBC  URINE MICROSCOPIC-ADD ON  Randolm Idol, ED    Imaging Review Dg Chest 2 View  06/22/2015   CLINICAL DATA:  Shortness of breath and chest pain onset today. Smoker.  EXAM: CHEST  2 VIEW  COMPARISON:  02/01/2015  FINDINGS: Cardiac pacemaker. Mild hyperinflation. Normal heart size and pulmonary vascularity. No focal airspace disease or consolidation in the lungs. No blunting of costophrenic angles. No pneumothorax. Mediastinal contours appear intact.  IMPRESSION: No active cardiopulmonary disease.   Electronically Signed   By: Lucienne Capers M.D.   On: 06/22/2015 00:38     EKG Interpretation   Date/Time:  Thursday June 21 2015 23:50:41 EDT Ventricular Rate:  100 PR Interval:  184 QRS Duration: 88 QT Interval:  354 QTC Calculation: 456 R Axis:   -42 Text Interpretation:  Normal sinus rhythm Biatrial enlargement Left axis  deviation Anterior infarct , age undetermined Abnormal ECG No significant  change since last tracing Confirmed by Glynn Octave [phone]) on  06/22/2015 3:34:05 AM      MDM   Final diagnoses:  None    Patient presents to the ED for shortness of breath in the setting of not taking his lasix for 5 days.  He has sleep orthopnea and DOE.  No chest pain.  His history is not consistent with ACS.  Troponin ordered by triage and not clinically indicated.  Patient ordered to take lasix daily x 2 days and follow up with Dr. Enid Derry.  He appears well and in NAD.  No hypoxia.  His VS remain within his normal limits and he is safe for DC.  I personally performed the services described in this documentation, which was scribed in my  presence. The recorded information has been reviewed and is accurate.     Everlene Balls, MD 06/22/15 [phone]

## 2015-06-22 NOTE — Discharge Instructions (Signed)
Shortness of Breath Mr. Leamer, your difficulty breathing is likely due to your heart failure.  Take your lasix for 2 days in a row, then use it as needed.  See your cardiologist within 3 days for close follow up.  If symptoms worsen, come back to the ED immediately.  Thank you. Shortness of breath means you have trouble breathing. Shortness of breath needs medical care right away. HOME CARE   Do not smoke.  Avoid being around chemicals or things (paint fumes, dust) that may bother your breathing.  Rest as needed. Slowly begin your normal activities.  Only take medicines as told by your doctor.  Keep all doctor visits as told. GET HELP RIGHT AWAY IF:   Your shortness of breath gets worse.  You feel lightheaded, pass out (faint), or have a cough that is not helped by medicine.  You cough up blood.  You have pain with breathing.  You have pain in your chest, arms, shoulders, or belly (abdomen).  You have a fever.  You cannot walk up stairs or exercise the way you normally do.  You do not get better in the time expected.  You have a hard time doing normal activities even with rest.  You have problems with your medicines.  You have any new symptoms. MAKE SURE YOU:  Understand these instructions.  Will watch your condition.  Will get help right away if you are not doing well or get worse. Document Released: 03/17/2008 Document Revised: 10/04/2013 Document Reviewed: 12/15/2011 [facility] Patient Information 2015 [place], [place]. This information is not intended to replace advice given to you by your health care provider. Make sure you discuss any questions you have with your health care provider.

## 2015-07-10 ENCOUNTER — Encounter (HOSPITAL_COMMUNITY): Payer: Medicare Other

## 2015-07-16 ENCOUNTER — Encounter (HOSPITAL_COMMUNITY): Payer: Self-pay | Admitting: *Deleted

## 2015-07-16 ENCOUNTER — Emergency Department (HOSPITAL_COMMUNITY)
Admission: EM | Admit: 2015-07-16 | Discharge: 2015-07-17 | Disposition: A | Discharge status: Home / Self Care | Payer: Medicare Other | Attending: Emergency Medicine | Admitting: Emergency Medicine

## 2015-07-16 ENCOUNTER — Emergency Department (HOSPITAL_COMMUNITY): Payer: Medicare Other

## 2015-07-16 DIAGNOSIS — E7800 Pure hypercholesterolemia, unspecified: Secondary | ICD-10-CM | POA: Insufficient documentation

## 2015-07-16 DIAGNOSIS — Z79899 Other long term (current) drug therapy: Secondary | ICD-10-CM | POA: Diagnosis not present

## 2015-07-16 DIAGNOSIS — F1994 Other psychoactive substance use, unspecified with psychoactive substance-induced mood disorder: Secondary | ICD-10-CM

## 2015-07-16 DIAGNOSIS — Z8701 Personal history of pneumonia (recurrent): Secondary | ICD-10-CM | POA: Insufficient documentation

## 2015-07-16 DIAGNOSIS — E119 Type 2 diabetes mellitus without complications: Secondary | ICD-10-CM | POA: Insufficient documentation

## 2015-07-16 DIAGNOSIS — R42 Dizziness and giddiness: Secondary | ICD-10-CM | POA: Diagnosis present

## 2015-07-16 DIAGNOSIS — R000 Tachycardia, unspecified: Secondary | ICD-10-CM | POA: Insufficient documentation

## 2015-07-16 DIAGNOSIS — Z7901 Long term (current) use of anticoagulants: Secondary | ICD-10-CM | POA: Insufficient documentation

## 2015-07-16 DIAGNOSIS — I5022 Chronic systolic (congestive) heart failure: Secondary | ICD-10-CM | POA: Diagnosis not present

## 2015-07-16 DIAGNOSIS — R0602 Shortness of breath: Secondary | ICD-10-CM | POA: Diagnosis not present

## 2015-07-16 DIAGNOSIS — F329 Major depressive disorder, single episode, unspecified: Secondary | ICD-10-CM | POA: Diagnosis not present

## 2015-07-16 DIAGNOSIS — Z794 Long term (current) use of insulin: Secondary | ICD-10-CM | POA: Insufficient documentation

## 2015-07-16 DIAGNOSIS — F0630 Mood disorder due to known physiological condition, unspecified: Secondary | ICD-10-CM

## 2015-07-16 DIAGNOSIS — R011 Cardiac murmur, unspecified: Secondary | ICD-10-CM | POA: Diagnosis not present

## 2015-07-16 DIAGNOSIS — F1410 Cocaine abuse, uncomplicated: Secondary | ICD-10-CM | POA: Insufficient documentation

## 2015-07-16 DIAGNOSIS — Z720 Tobacco use: Secondary | ICD-10-CM | POA: Insufficient documentation

## 2015-07-16 DIAGNOSIS — F32A Depression, unspecified: Secondary | ICD-10-CM

## 2015-07-16 DIAGNOSIS — I509 Heart failure, unspecified: Secondary | ICD-10-CM

## 2015-07-16 DIAGNOSIS — Z95810 Presence of automatic (implantable) cardiac defibrillator: Secondary | ICD-10-CM | POA: Diagnosis not present

## 2015-07-16 DIAGNOSIS — F1910 Other psychoactive substance abuse, uncomplicated: Secondary | ICD-10-CM

## 2015-07-16 LAB — I-STAT TROPONIN, ED: Troponin i, poc: 0.01 ng/mL (ref 0.00–0.08)

## 2015-07-16 LAB — URINE MICROSCOPIC-ADD ON

## 2015-07-16 LAB — CBC WITH DIFFERENTIAL/PLATELET
Basophils Absolute: 0 10*3/uL (ref 0.0–0.1)
Basophils Relative: 0 %
Eosinophils Absolute: 0.1 10*3/uL (ref 0.0–0.7)
Eosinophils Relative: 1 %
HCT: 39.1 % (ref 39.0–52.0)
Hemoglobin: 13 g/dL (ref 13.0–17.0)
Lymphocytes Relative: 19 %
Lymphs Abs: 1.9 10*3/uL (ref 0.7–4.0)
MCH: 30 pg (ref 26.0–34.0)
MCHC: 33.2 g/dL (ref 30.0–36.0)
MCV: 90.1 fL (ref 78.0–100.0)
Monocytes Absolute: 0.8 10*3/uL (ref 0.1–1.0)
Monocytes Relative: 8 %
Neutro Abs: 7.2 10*3/uL (ref 1.7–7.7)
Neutrophils Relative %: 72 %
Platelets: 193 10*3/uL (ref 150–400)
RBC: 4.34 MIL/uL (ref 4.22–5.81)
RDW: 12.8 % (ref 11.5–15.5)
WBC: 9.9 10*3/uL (ref 4.0–10.5)

## 2015-07-16 LAB — COMPREHENSIVE METABOLIC PANEL
ALT: 16 U/L — ABNORMAL LOW (ref 17–63)
AST: 25 U/L (ref 15–41)
Albumin: 3.8 g/dL (ref 3.5–5.0)
Alkaline Phosphatase: 85 U/L (ref 38–126)
Anion gap: 10 (ref 5–15)
BUN: 17 mg/dL (ref 6–20)
CO2: 24 mmol/L (ref 22–32)
Calcium: 9.4 mg/dL (ref 8.9–10.3)
Chloride: 98 mmol/L — ABNORMAL LOW (ref 101–111)
Creatinine, Ser: 1.23 mg/dL (ref 0.61–1.24)
GFR calc Af Amer: >60 mL/min (ref >60)
GFR calc non Af Amer: >60 mL/min (ref >60)
Glucose, Bld: 455 mg/dL — ABNORMAL HIGH (ref 65–99)
Potassium: 3.9 mmol/L (ref 3.5–5.1)
Sodium: 132 mmol/L — ABNORMAL LOW (ref 135–145)
Total Bilirubin: 1.4 mg/dL — ABNORMAL HIGH (ref 0.3–1.2)
Total Protein: 6.7 g/dL (ref 6.5–8.1)

## 2015-07-16 LAB — PROTIME-INR
INR: 1.23 (ref 0.00–1.49)
Prothrombin Time: 15.7 seconds — ABNORMAL HIGH (ref 11.6–15.2)

## 2015-07-16 LAB — URINALYSIS, ROUTINE W REFLEX MICROSCOPIC
Bilirubin Urine: NEGATIVE
Glucose, UA: >1000 mg/dL — AB
Ketones, ur: 15 mg/dL — AB
Leukocytes, UA: NEGATIVE
Nitrite: NEGATIVE
Protein, ur: NEGATIVE mg/dL
Specific Gravity, Urine: 1.04 — ABNORMAL HIGH (ref 1.005–1.030)
Urobilinogen, UA: 1 mg/dL (ref 0.0–1.0)
pH: 5 (ref 5.0–8.0)

## 2015-07-16 LAB — RAPID URINE DRUG SCREEN, HOSP PERFORMED
Amphetamines: NOT DETECTED
Barbiturates: NOT DETECTED
Benzodiazepines: NOT DETECTED
Cocaine: POSITIVE — AB
Opiates: NOT DETECTED
Tetrahydrocannabinol: NOT DETECTED

## 2015-07-16 LAB — CBG MONITORING, ED
Glucose-Capillary: 276 mg/dL — ABNORMAL HIGH (ref 65–99)
Glucose-Capillary: 335 mg/dL — ABNORMAL HIGH (ref 65–99)

## 2015-07-16 LAB — DIGOXIN LEVEL: Digoxin Level: <0.2 ng/mL — ABNORMAL LOW (ref 0.8–2.0)

## 2015-07-16 IMAGING — DX DG CHEST 2V
2 series · 2 of 2 positions shown · non-contrast
Comparison: [DATE]

CLINICAL DATA: Shortness of breath and fatigue. History of smoking.

EXAM:
CHEST  2 VIEW

[w chest pa]
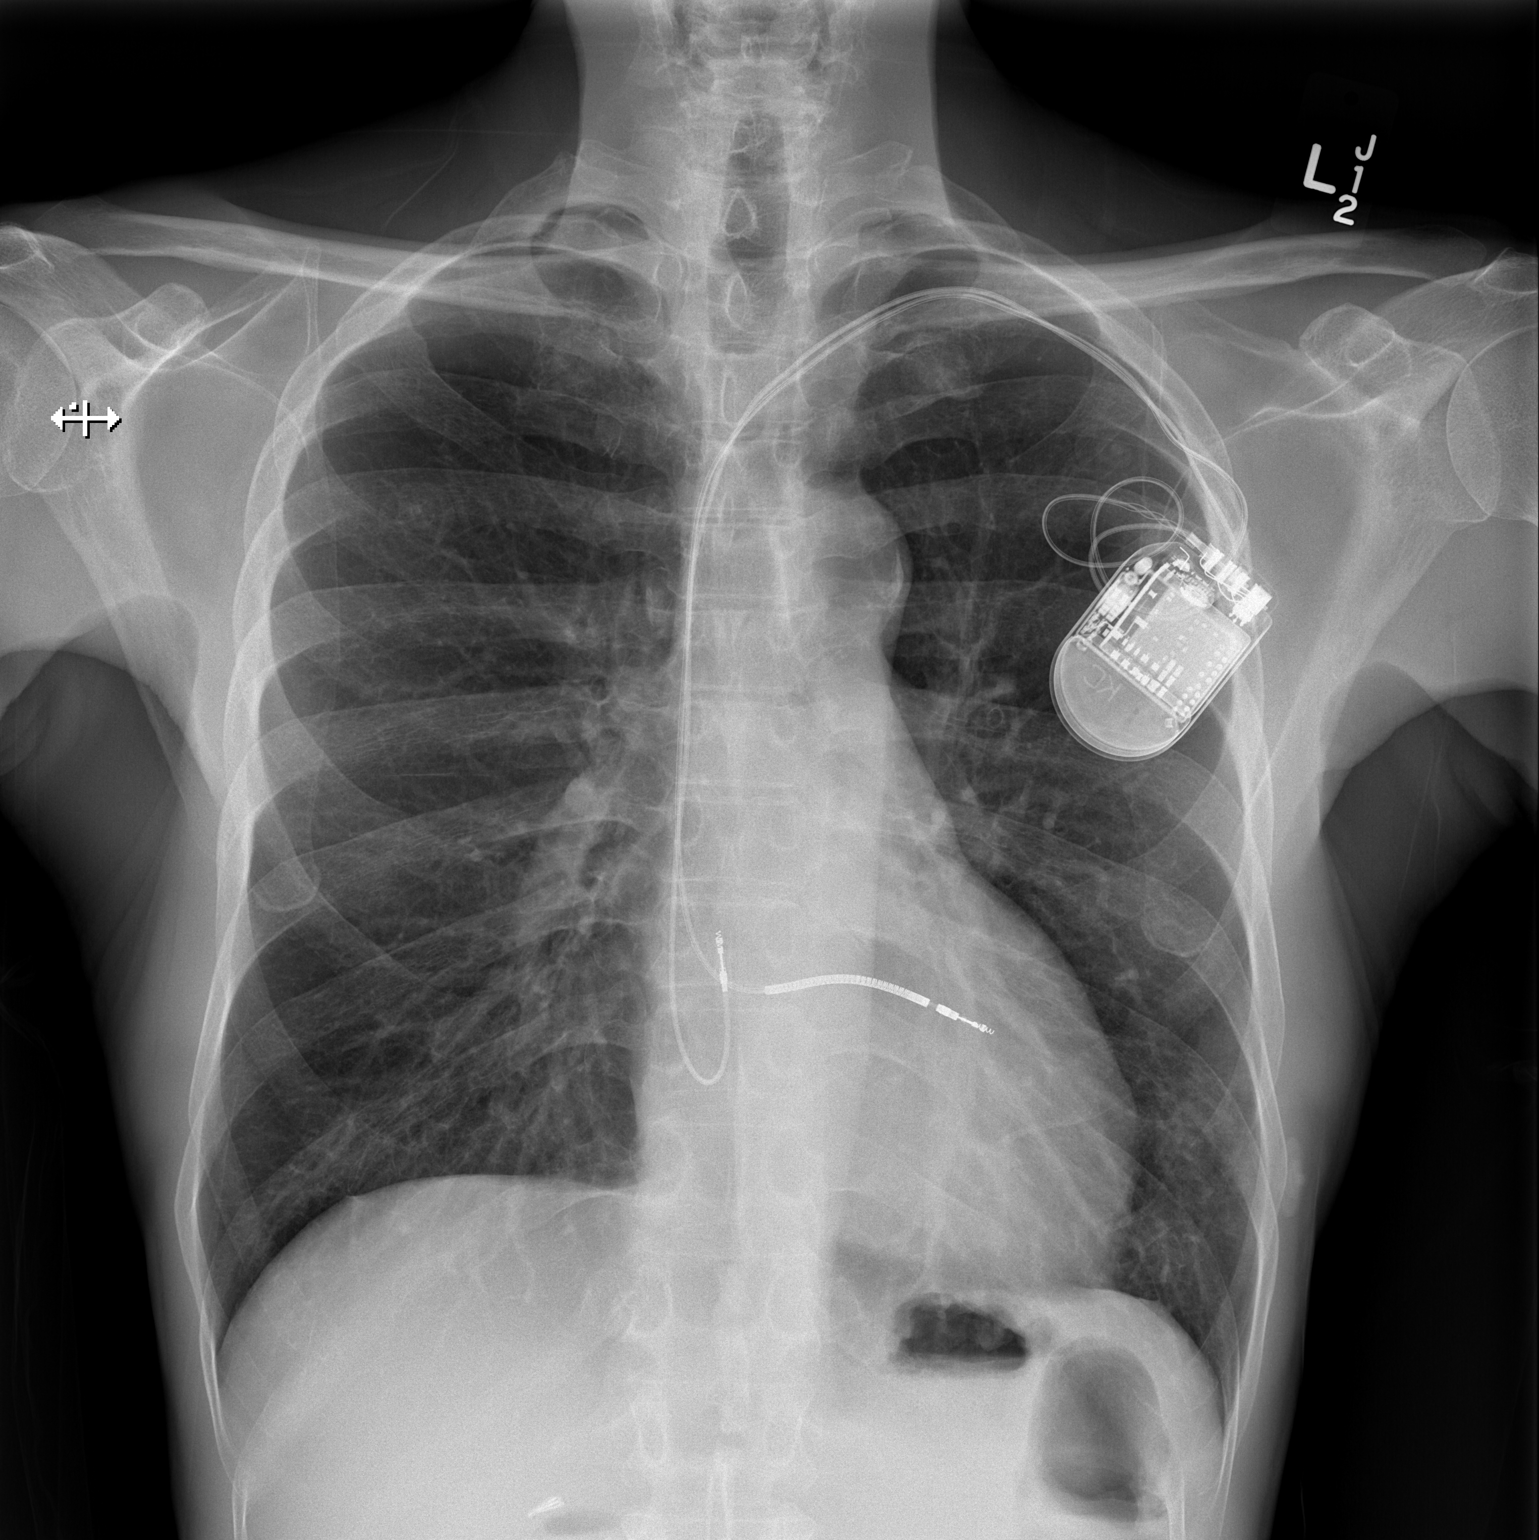

[w chest lat]
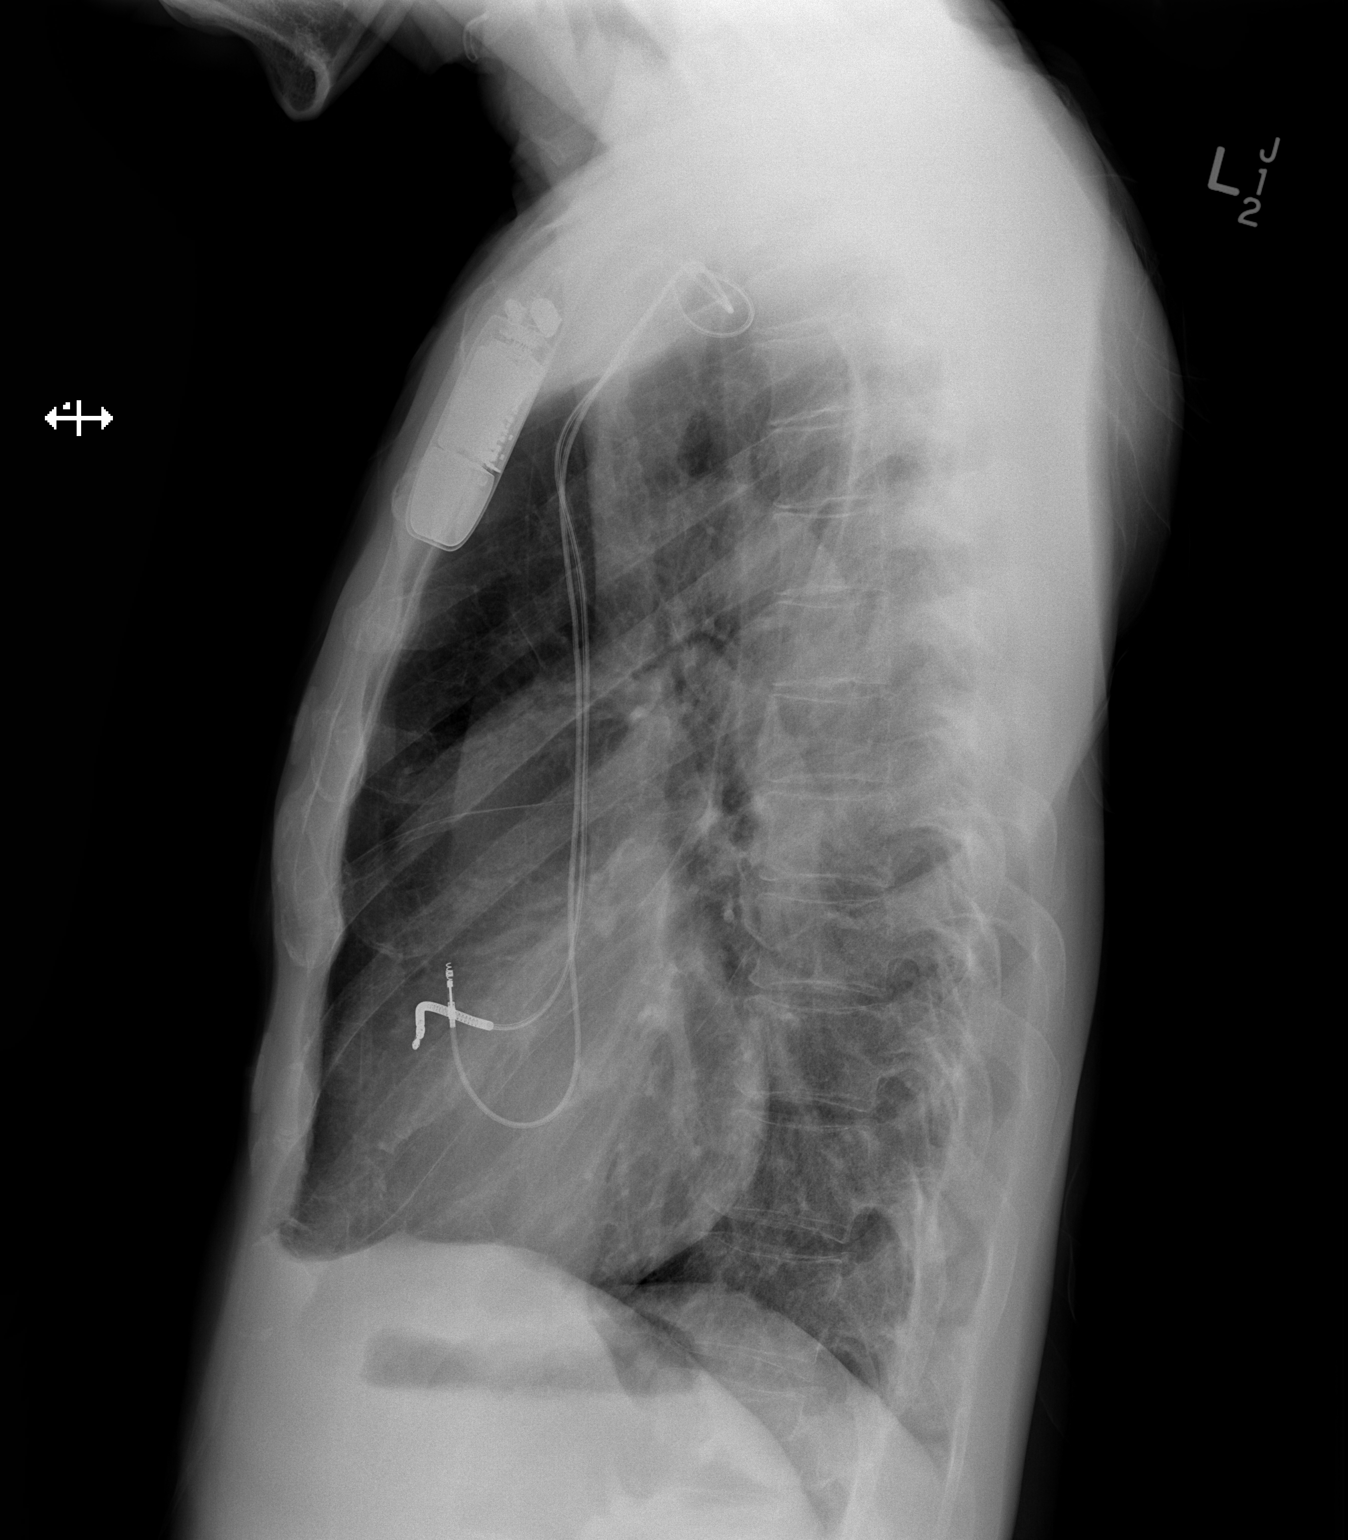

[2 of 2 positions shown; findings below may reference images not displayed]

FINDINGS: Dual lead cardiac pacemaker is stable.

Cardiomediastinal silhouette is stably enlarged. Mediastinal
contours appear intact. Atherosclerotic calcifications of the aortic
arch are noted.

There is no evidence of focal airspace consolidation, pleural
effusion or pneumothorax. The lungs are hyperinflated

Osseous structures are without acute abnormality. Soft tissues are
grossly normal.
IMPRESSION: Stable hyperinflation of the lungs, without evidence of focal
airspace consolidation.

Stable enlargement of the cardiac silhouette.

## 2015-07-16 MED ORDER — SPIRONOLACTONE 12.5 MG HALF TABLET
12.5000 mg | ORAL_TABLET | Freq: Every day | ORAL | Status: DC
  Administered 2015-07-17: 12.5 mg via ORAL
  Filled 2015-07-16: qty 1

## 2015-07-16 MED ORDER — INSULIN ASPART 100 UNIT/ML ~~LOC~~ SOLN
0.0000 [IU] | Freq: Three times a day (TID) | SUBCUTANEOUS | Status: DC
  Administered 2015-07-17: 5 [IU] via SUBCUTANEOUS
  Administered 2015-07-17: 3 [IU] via SUBCUTANEOUS
  Filled 2015-07-16 (×2): qty 1

## 2015-07-16 MED ORDER — ATORVASTATIN CALCIUM 20 MG PO TABS
20.0000 mg | ORAL_TABLET | Freq: Every day | ORAL | Status: DC
Start: 2015-07-17 — End: 2015-07-17
  Filled 2015-07-16: qty 1

## 2015-07-16 MED ORDER — WARFARIN SODIUM 7.5 MG PO TABS
7.5000 mg | ORAL_TABLET | Freq: Every day | ORAL | Status: DC
Start: 2015-07-17 — End: 2015-07-16

## 2015-07-16 MED ORDER — INSULIN ASPART 100 UNIT/ML ~~LOC~~ SOLN
0.0000 [IU] | Freq: Every day | SUBCUTANEOUS | Status: DC
  Administered 2015-07-16: 4 [IU] via SUBCUTANEOUS
  Filled 2015-07-16: qty 1

## 2015-07-16 MED ORDER — DIGOXIN 125 MCG PO TABS
0.2500 mg | ORAL_TABLET | Freq: Every day | ORAL | Status: DC
  Administered 2015-07-16 – 2015-07-17 (×2): 0.25 mg via ORAL
  Filled 2015-07-16 (×2): qty 2

## 2015-07-16 MED ORDER — WARFARIN SODIUM 10 MG PO TABS
10.0000 mg | ORAL_TABLET | Freq: Once | ORAL | Status: AC
  Administered 2015-07-16: 10 mg via ORAL
  Filled 2015-07-16: qty 1

## 2015-07-16 MED ORDER — WARFARIN - PHARMACIST DOSING INPATIENT: Freq: Every day | Status: DC

## 2015-07-16 MED ORDER — FUROSEMIDE 20 MG PO TABS: 40.0000 mg | ORAL_TABLET | Freq: Every day | ORAL | Status: DC | PRN

## 2015-07-16 MED ORDER — INSULIN GLARGINE 100 UNIT/ML ~~LOC~~ SOLN
30.0000 [IU] | Freq: Every day | SUBCUTANEOUS | Status: DC
  Administered 2015-07-16: 30 [IU] via SUBCUTANEOUS
  Filled 2015-07-16 (×2): qty 0.3

## 2015-07-16 MED ORDER — LOSARTAN POTASSIUM 25 MG PO TABS
12.5000 mg | ORAL_TABLET | Freq: Every day | ORAL | Status: DC
  Administered 2015-07-17: 12.5 mg via ORAL
  Filled 2015-07-16: qty 0.5

## 2015-07-16 NOTE — Progress Notes (Signed)
Patient meets inpatient criteria for psych treatment in a facility with med/surg unit.  Referral was sent to: [facility] - per Jenny Reichmann, fax referral for review, d/c on 10/04. [facility] - per [place], beds open, fax referral. [place] - left voicemail with call back number. [place] - per [name], will return call back, psych and med/surg units connected. Per Izora Gala, please fax referral for review.  [facility] - per [place], no med/surg unit available or attached.  At capacity: [place] - per Eartha Inch, no beds and d/c preassigned for 10/04. [place] - per Speed  CSW will continue to seek placement.  Verlon Setting, [place] Disposition staff 07/16/2015 11:09 PM

## 2015-07-16 NOTE — ED Notes (Signed)
Pt reports light headed and dizzy.

## 2015-07-16 NOTE — ED Notes (Signed)
Belongings collected.  Security wanded.  Pt placed in paper burgundy scrubs.

## 2015-07-16 NOTE — ED Notes (Signed)
TTS at bedside. 

## 2015-07-16 NOTE — ED Provider Notes (Addendum)
CSN: [id number]     Arrival date & time 07/16/15  1202 History   First MD Initiated Contact with Patient 07/16/15 1608     Chief Complaint  Patient presents with  . Addiction Problem  . Dizziness  . Shortness of Breath     (Consider location/radiation/quality/duration/timing/severity/associated sxs/prior Treatment) Patient is a 54 y.o. male presenting with dizziness and shortness of breath. The history is provided by the patient.  Dizziness Associated symptoms: shortness of breath   Associated symptoms: no chest pain, no diarrhea, no headaches, no nausea, no vomiting and no weakness   Shortness of Breath Associated symptoms: no abdominal pain, no chest pain, no headaches, no rash and no vomiting    patient presents with dizziness shortness of breath and substance abuse. States she's been feeling bad for last couple weeks. States he's had some more trouble sleeping laying down. No chest pain. No swelling. States his weight is actually down. No fevers. He has an occasional cough. States he also get lightheaded at times. Blood pressure is slightly low here but states that he has had low blood pressure over the last year since they've added new medicines. His pressure will usually run in the 90s. He sees a heart failure clinic. States he has not been taking all his medications. Also states he is abusing alcohol and cocaine. States he'll drink about a 12 pack a day and last drink 2 days ago. Has had seizures in the past with alcohol withdrawal. Also has had cocaine abuse. States he smokes it. States that he is using large amounts everyday. Last use this morning. Patient also states he is depressed. May have occasional suicidal thoughts but is not frankly suicidal.  He also states that he will occasionally get some drooling out of left side of his mouth. 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 States he normally does have much deficits but time will come and go.  Last episode of this was around 2 weeks ago.  Past Medical History  Diagnosis Date  . Chronic systolic heart failure (HCC)     a) Mixed ICM/NICM b) RHC (05/2014): RA 2, RV 19/2/3, PA 22/14 (18), PCWP 6, Fick CO/CI: 5.2 / 2.7, PVR 2.3 WU, PA 60% and 64% c) ECHO (05/2014): EF 20-25%, diff HK, akinesis entireanteroseptal myocardium, triv AI, mod MR, LA mod/sev dilated  . Automatic implantable cardioverter-defibrillator in situ   . Heart murmur   . Sleep apnea     "cleared after T&A"  . 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 Ischemic cardiomyopathy     a) Coronary angiography (12/2008) at [facility]: Lmain: nl, LAD mid 100% stenosis with left to left and right-to-left collaterals to the distal LAD; Lcx: nl, RCA nl.    . Type II diabetes mellitus ([place])   . High cholesterol   . Pneumonia 1988  . Drug abuse and dependence ([place])   . CHF (congestive heart failure) [facility])    Past Surgical History  Procedure Laterality Date  . Forearm fracture surgery Left 1980  . Cardiac defibrillator placement  03/2011  . Cholecystectomy  1994  . Right heart catheterization N/A 05/30/2014    Procedure: RIGHT HEART CATH;  Surgeon: Jolaine Artist, MD;  Location: [facility] CATH LAB;  Service: Cardiovascular;  Laterality: N/A;  . Fracture surgery    . Cardiac catheterization  05/2014  . Tonsillectomy and adenoidectomy  ~ 1998  . Right heart catheterization N/A 02/07/2015  Procedure: RIGHT HEART CATH;  Surgeon: Jolaine Artist, MD;  Location: [facility] CATH LAB;  Service: Cardiovascular;  Laterality: N/A;   Family History  Problem Relation Age of Onset  . Diabetes Mother   . Heart disease Mother   . Diabetes Father   . Prostate cancer Father   . Heart disease Father   . Diabetes Brother   . Diabetes Brother    Social History  Substance Use Topics  . Smoking status: Current Some Day Smoker -- 0.10 packs/day for 31 years    Types: Cigarettes  . Smokeless tobacco: Never Used  . Alcohol Use: 0.0 oz/week    0 Standard  drinks or equivalent per week     Comment: 09/21/2014 "might have a drink q 6 /months, if that"    Review of Systems  Constitutional: Negative for activity change and appetite change.  Eyes: Negative for pain.  Respiratory: Positive for shortness of breath. Negative for chest tightness.   Cardiovascular: Negative for chest pain and leg swelling.  Gastrointestinal: Negative for nausea, vomiting, abdominal pain and diarrhea.  Genitourinary: Negative for flank pain.  Musculoskeletal: Negative for back pain and neck stiffness.  Skin: Negative for rash.  Neurological: Positive for dizziness and light-headedness. Negative for weakness, numbness and headaches.  Psychiatric/Behavioral: Negative for behavioral problems.      Allergies  Review of patient's allergies indicates no known allergies.  Home Medications   Prior to Admission medications   Medication Sig Start Date End Date Taking? Authorizing Provider  atorvastatin (LIPITOR) 20 MG tablet Take 1 tablet (20 mg total) by mouth daily at 6 PM. 02/07/15  Yes Florencia Reasons, MD  digoxin (LANOXIN) 0.25 MG tablet Take 1 tablet (0.25 mg total) by mouth daily. 02/07/15  Yes Florencia Reasons, MD  furosemide (LASIX) 40 MG tablet Take 1 tablet (40 mg total) by mouth daily as needed (weight greater than 159 pounds). 03/19/15  Yes Shaune Pascal Bensimhon, MD  hydrocerin (EUCERIN) CREA Apply 1 application topically 2 (two) times daily. 02/13/15  Yes Ivan Anchors Love, PA-C  Insulin Glargine (LANTUS) 100 UNIT/ML Solostar Pen Inject 30 Units into the skin daily at 10 pm. Dispense pen and needles 02/14/15  Yes Ivan Anchors Love, PA-C  losartan (COZAAR) 25 MG tablet Take 0.5 tablets (12.5 mg total) by mouth daily. 02/28/15  Yes Jolaine Artist, MD  spironolactone (ALDACTONE) 25 MG tablet Take 0.5 tablets (12.5 mg total) by mouth daily. 02/13/15  Yes Ivan Anchors Love, PA-C  warfarin (COUMADIN) 7.5 MG tablet Take as directed by coumadin clinic Patient taking differently: Take 7.5 mg by mouth  daily at 6 PM.  03/19/15  Yes Jolaine Artist, MD  glucose blood (ONETOUCH VERIO) test strip Use as instructed 11/30/14   [place], FNP  insulin aspart (NOVOLOG FLEXPEN) 100 UNIT/ML FlexPen Use as directed. Patient not taking: Reported on 07/16/2015 02/14/15   Bary Leriche, PA-C  QC PEN NEEDLES 31G X 6 MM MISC 1 each by Other route See admin instructions. Use daily with insulin. 08/06/12   Historical Provider, MD   BP 112/83 mmHg  Pulse 98  Temp(Src) 99.3 F (37.4 C) (Oral)  Resp 12  SpO2 95% Physical Exam  Constitutional: He appears well-developed and well-nourished.  HENT:  Head: Atraumatic.  Neck: JVD present.  Cardiovascular:  Mild tachycardia  Pulmonary/Chest: Effort normal. He has no rales. He exhibits no tenderness.  AICD left chest wall  Abdominal: Soft. There is no rebound.  Musculoskeletal: He exhibits no edema.  Neurological: He is alert.  Skin: Skin is warm.  Psychiatric: He has a normal mood and affect.    ED Course  Procedures (including critical care time) Labs Review Labs Reviewed  COMPREHENSIVE METABOLIC PANEL - Abnormal; Notable for the following:    Sodium 132 (*)    Chloride 98 (*)    Glucose, Bld 455 (*)    ALT 16 (*)    Total Bilirubin 1.4 (*)    All other components within normal limits  URINE RAPID DRUG SCREEN, HOSP PERFORMED - Abnormal; Notable for the following:    Cocaine POSITIVE (*)    All other components within normal limits  URINALYSIS, ROUTINE W REFLEX MICROSCOPIC (NOT AT [facility]) - Abnormal; Notable for the following:    Specific Gravity, Urine 1.040 (*)    Glucose, UA >1000 (*)    Hgb urine dipstick TRACE (*)    Ketones, ur 15 (*)    All other components within normal limits  PROTIME-INR - Abnormal; Notable for the following:    Prothrombin Time 15.7 (*)    All other components within normal limits  DIGOXIN LEVEL - Abnormal; Notable for the following:    Digoxin Level <0.2 (*)    All other components within normal limits  CBG  MONITORING, ED - Abnormal; Notable for the following:    Glucose-Capillary 276 (*)    All other components within normal limits  CBC WITH DIFFERENTIAL/PLATELET  URINE MICROSCOPIC-ADD ON  Randolm Idol, ED    Imaging Review Dg Chest 2 View  07/16/2015   CLINICAL DATA:  Shortness of breath and fatigue. History of smoking.  EXAM: CHEST  2 VIEW  COMPARISON:  06/22/2015  FINDINGS: Dual lead cardiac pacemaker is stable.  Cardiomediastinal silhouette is stably enlarged. Mediastinal contours appear intact. Atherosclerotic calcifications of the aortic arch are noted.  There is no evidence of focal airspace consolidation, pleural effusion or pneumothorax. The lungs are hyperinflated  Osseous structures are without acute abnormality. Soft tissues are grossly normal.  IMPRESSION: Stable hyperinflation of the lungs, without evidence of focal airspace consolidation.  Stable enlargement of the cardiac silhouette.   Electronically Signed   By: Fidela Salisbury M.D.   On: 07/16/2015 13:45   I have personally reviewed and evaluated these images and lab results as part of my medical decision-making.   EKG Interpretation   Date/Time:  Monday July 16 2015 13:05:47 EDT Ventricular Rate:  117 PR Interval:  170 QRS Duration: 90 QT Interval:  304 QTC Calculation: 424 R Axis:   17 Text Interpretation:  Sinus tachycardia Right atrial enlargement Left  ventricular hypertrophy No significant change since last tracing Confirmed  by Alvino Chapel  MD, Ovid Curd [phone]) on 07/16/2015 4:11:28 PM      MDM   Final diagnoses:  Polysubstance abuse  Depression  Congestive heart failure, unspecified congestive heart failure chronicity, unspecified congestive heart failure type [facility])    Patient with dizziness and lightheadedness. Looks to be near his baseline in terms of CHF. Has some mild hypotension but this is his baseline. Appears cleared from a medical perspective for psychiatric treatment. Mild hyperglycemia  has improved. Will start back on insulin. Appears cleared for psychiatric treatment of his substance abuse and depression.    Davonna Belling, MD 07/16/15 1850  TTS is recommended inpatient treatment and will attempt to find placement.  Davonna Belling, MD 07/17/15 [phone]

## 2015-07-16 NOTE — Progress Notes (Signed)
ANTICOAGULATION CONSULT NOTE - Follow Up Consult  Pharmacy Consult for warfarin Indication: Left ventricular noncompaction  No Known Allergies  Patient Measurements:    Vital Signs: Temp: 99.3 F (37.4 C) (10/03 1259) Temp Source: Oral (10/03 1259) BP: 112/83 mmHg (10/03 1645) Pulse Rate: 98 (10/03 1645)  Labs:  Recent Labs  07/16/15 1308  HGB 13.0  HCT 39.1  PLT 193  LABPROT 15.7*  INR 1.23  CREATININE 1.23    CrCl cannot be calculated (Unknown ideal weight.).   Medications:   (Not in a hospital admission)  Assessment: 82 yoM on warfarin for Left ventricular noncompaction w/ an INR goal of 2-3. Most recently was taking 7.5 mg daily. INR today is 1.23, Plt 193, no reports of bleeding.  Goal of Therapy:  INR 2-3 Monitor platelets by anticoagulation protocol: Yes   Plan:  Will give warfarin 10 mg x1 to get patient back to goal range Daily INR CBC q72h   Governor Specking, PharmD Clinical Pharmacy Resident Pager: [phone] 07/16/2015,8:25 PM

## 2015-07-16 NOTE — ED Notes (Signed)
Pt states he thinks about killing himself although does not have a plan.  Pt states he hears and sees things that are not there or heard/seen by others.  Pt c/o paranoia.  Pt used cocaine this morning and last drink was 2 days ago.

## 2015-07-16 NOTE — ED Notes (Signed)
Sitter at bedside.

## 2015-07-16 NOTE — BH Assessment (Signed)
Tele Assessment Note   Dennis [name] is an 54 y.o. male.  -Clinician spoke to Dr. Alvino Chapel about need for TTS consult.  Patient came in with request to get help detoxing from ETOH and cocaine.  Patient has some depression but has initially said he does not want to kill himself.  Patient admits to drinking about a pint of liquor and a 12 pack of beer daily.  Has been drinking at this rate for the last 5 years he reports.  Patient says he last drank yesterday.  Patient also is using crack cocaine.  He is unclear about how often he uses crack but said that he has used a lot over the last 3 days.  Unclear about how often he uses prior to the last three days.  Patient says he lives with his mother but feels like he may "kill myself if I go back there."  Patient cannot currently contract for safety.  Patient endorses suicidal feelings now with a plan to "jump from a building."  Patient says he has never attempted to kill himself before.  His depression has been getting worse over the last few weeks and he has been thinking about killing himself since then.  Patient denies current HI but says he thinks sometimes of harming others but "no body in particular."  Patient also says that he sometimes sees "ghosts."  He denies any auditory hallucinations.    Patient says "I need help."  He denies having been in any inpatient facilities in the past.  Patient says he has lost weight over the last few months.  Depression affects him not wanting to get out of bed.  He reports sleeping less than 4 hours per day.  -Clinician discussed patient care with Patriciaann Clan, PA who recommends inpatient care.  Clinician spoke with Dr. Alvino Chapel and informed him about patient being suicidal.  He is in agreement with finding a psychiatric facility with a med/surg unit attached.  TTS to make referrals.  Diagnosis:  Axis 1: 303.90 ETOH use d/o severe; 296.24 MDD single episode w/ psychotic features; 304.20 Cocaine use d/o severe Axis  2: Deferred Axis 3: See H & P Axis 4: financial stressors; health concerns Axis 5: GAF 25  Past Medical History:  Past Medical History  Diagnosis Date  . Chronic systolic heart failure (HCC)     a) Mixed ICM/NICM b) RHC (05/2014): RA 2, RV 19/2/3, PA 22/14 (18), PCWP 6, Fick CO/CI: 5.2 / 2.7, PVR 2.3 WU, PA 60% and 64% c) ECHO (05/2014): EF 20-25%, diff HK, akinesis entireanteroseptal myocardium, triv AI, mod MR, LA mod/sev dilated  . Automatic implantable cardioverter-defibrillator in situ   . Heart murmur   . Sleep apnea     "cleared after T&A"  . 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 Ischemic cardiomyopathy     a) Coronary angiography (12/2008) at [facility]: Lmain: nl, LAD mid 100% stenosis with left to left and right-to-left collaterals to the distal LAD; Lcx: nl, RCA nl.    . Type II diabetes mellitus ([place])   . High cholesterol   . Pneumonia 1988  . Drug abuse and dependence ([place])   . CHF (congestive heart failure) [facility])     Past Surgical History  Procedure Laterality Date  . Forearm fracture surgery Left 1980  . Cardiac defibrillator placement  03/2011  . Cholecystectomy  1994  . Right heart catheterization N/A 05/30/2014    Procedure: RIGHT HEART CATH;  Surgeon: Jolaine Artist, MD;  Location:  [place] CATH LAB;  Service: Cardiovascular;  Laterality: N/A;  . Fracture surgery    . Cardiac catheterization  05/2014  . Tonsillectomy and adenoidectomy  ~ 1998  . Right heart catheterization N/A 02/07/2015    Procedure: RIGHT HEART CATH;  Surgeon: Jolaine Artist, MD;  Location: [facility] CATH LAB;  Service: Cardiovascular;  Laterality: N/A;    Family History:  Family History  Problem Relation Age of Onset  . Diabetes Mother   . Heart disease Mother   . Diabetes Father   . Prostate cancer Father   . Heart disease Father   . Diabetes Brother   . Diabetes Brother     Social History:  reports that he has been smoking Cigarettes.  He has a 3.1 pack-year smoking history. He has never  used smokeless tobacco. He reports that he drinks alcohol. He reports that he uses illicit drugs (Cocaine).  Additional Social History:  Alcohol / Drug Use Pain Medications: None Prescriptions: See PTA medication list Over the Counter: See PTA medication list History of alcohol / drug use?: Yes Longest period of sobriety (when/how long): 4 months in 2008 Withdrawal Symptoms: Nausea / Vomiting, Diarrhea, Weakness, Patient aware of relationship between substance abuse and physical/medical complications, Seizures, Fever / Chills, Sweats Onset of Seizures: Withdrawal Date of most recent seizure: "at the beginning of this year (2016)" Substance #1 Name of Substance 1: ETOH (beer & liquor) 1 - Age of First Use: 54 years of age 75 - Amount (size/oz): Pint of liquor & a 12 pack of beer 1 - Frequency: Daily 1 - Duration: Last 5 years 1 - Last Use / Amount: 10/02 (12 pack) Substance #2 Name of Substance 2: Crack cocaine 2 - Age of First Use: In his 20's 2 - Amount (size/oz): About an ounce in the last 3 days 2 - Frequency: Daily 2 - Duration: Last three days.  Usually 4-5 days in a week 2 - Last Use / Amount: 10/03  CIWA: CIWA-Ar BP: 112/83 mmHg Pulse Rate: 98 Nausea and Vomiting: no nausea and no vomiting Tactile Disturbances: very mild itching, pins and needles, burning or numbness Tremor: two Auditory Disturbances: not present Paroxysmal Sweats: barely perceptible sweating, palms moist Visual Disturbances: very mild sensitivity Anxiety: mildly anxious Headache, Fullness in Head: very mild Agitation: normal activity Orientation and Clouding of Sensorium: oriented and can do serial additions CIWA-Ar Total: 7 COWS: Clinical Opiate Withdrawal Scale (COWS) Resting Pulse Rate: Pulse Rate 101-120 Sweating: Subjective report of chills or flushing Restlessness: Able to sit still Pupil Size: Pupils possibly larger than normal for room light Bone or Joint Aches: Not present Runny Nose or  Tearing: Nose running or tearing GI Upset: No GI symptoms Tremor: Slight tremor observable Yawning: No yawning Anxiety or Irritability: Patient reports increasing irritability or anxiousness Gooseflesh Skin: Piloerection of skin can be felt or hairs standing up on arms COWS Total Score: 12  PATIENT STRENGTHS: (choose at least two) Average or above average intelligence Communication skills Supportive family/friends  Allergies: No Known Allergies  Home Medications:  (Not in a hospital admission)  OB/GYN Status:  No LMP for male patient.  General Assessment Data Location of Assessment: [facility] ED TTS Assessment: In system Is this a Tele or Face-to-Face Assessment?: Tele Assessment Is this an Initial Assessment or a Re-assessment for this encounter?: Initial Assessment Marital status: Single Is patient pregnant?: No Pregnancy Status: No Living Arrangements: Parent (Pt is staying with his mother.) Can pt return to current living arrangement?: No (Pt  says he may hurt himself if he returns to mother's home.) Admission Status: Voluntary Is patient capable of signing voluntary admission?: Yes Referral Source: Self/Family/Friend Insurance type: [facility] MCR  Medical Screening Exam ([place]) Medical Exam completed: No Reason for MSE not completed:  (Pt is at [facility] Living Arrangements: Parent (Pt is staying with his mother.) Name of Psychiatrist: None Name of Therapist: None  Education Status Is patient currently in school?: No Highest grade of school patient has completed: HS diploma  Risk to self with the past 6 months Suicidal Ideation: Yes-Currently Present Has patient been a risk to self within the past 6 months prior to admission? : Yes Suicidal Intent: Yes-Currently Present Has patient had any suicidal intent within the past 6 months prior to admission? : Yes Is patient at risk for suicide?: Yes Suicidal Plan?: Yes-Currently Present Has patient had  any suicidal plan within the past 6 months prior to admission? : Yes Specify Current Suicidal Plan: "To jump from a building." Access to Means: Yes Specify Access to Suicidal Means: Tall buildings or parking decks What has been your use of drugs/alcohol within the last 12 months?: ETOH & crack Previous Attempts/Gestures: No How many times?: 0 Other Self Harm Risks: SA issues Triggers for Past Attempts: None known Intentional Self Injurious Behavior: None Family Suicide History: No Recent stressful life event(s): Financial Problems, Other (Comment) (Pt cannot identify other stressors.) Persecutory voices/beliefs?: Yes Depression: Yes Depression Symptoms: Despondent, Insomnia, Isolating, Guilt, Loss of interest in usual pleasures, Feeling worthless/self pity Substance abuse history and/or treatment for substance abuse?: Yes (Was at [facility] a long time ago.) Suicide prevention information given to non-admitted patients: Not applicable  Risk to Others within the past 6 months Homicidal Ideation: No-Not Currently/Within Last 6 Months Does patient have any lifetime risk of violence toward others beyond the six months prior to admission? : No Thoughts of Harm to Others: No-Not Currently Present/Within Last 6 Months Current Homicidal Intent: No Current Homicidal Plan: No Access to Homicidal Means: No Identified Victim: "No one in particular" History of harm to others?: Yes Assessment of Violence: In past 6-12 months Violent Behavior Description: This past summer. Does patient have access to weapons?: Yes (Comment) ("If I want one I know where to get it.") Criminal Charges Pending?: No Does patient have a court date: No Is patient on probation?: No  Psychosis Hallucinations: Visual (Pt says he sees ghost.) Delusions: None noted  Mental Status Report Appearance/Hygiene: Disheveled, In scrubs Eye Contact: Good Motor Activity: Freedom of movement, Unremarkable Speech:  Logical/coherent, Soft Level of Consciousness: Quiet/awake Mood: Depressed, Anxious, Despair, Empty, Helpless, Sad Affect: Anxious, Sad Anxiety Level: Moderate Thought Processes: Coherent, Relevant Judgement: Unimpaired Orientation: Person, Place, Time, Situation Obsessive Compulsive Thoughts/Behaviors: None  Cognitive Functioning Concentration: Decreased Memory: Recent Impaired, Remote Intact IQ: Average Insight: Fair Impulse Control: Poor Appetite: Poor Weight Loss:  ("At least 15 lbs") Weight Gain: 0 Sleep: Decreased Total Hours of Sleep:  (<4H/D) Vegetative Symptoms: Staying in bed, Decreased grooming  ADLScreening [facility] Assessment Services) Patient's cognitive ability adequate to safely complete daily activities?: Yes Patient able to express need for assistance with ADLs?: Yes Independently performs ADLs?: Yes (appropriate for developmental age)  Prior Inpatient Therapy Prior Inpatient Therapy: No Prior Therapy Dates: None Prior Therapy Facilty/Provider(s): None Reason for Treatment: None  Prior Outpatient Therapy Prior Outpatient Therapy: No Prior Therapy Dates: None Prior Therapy Facilty/Provider(s): N/A Reason for Treatment: N/a Does patient have an ACCT team?: No Does  patient have Intensive In-House Services?  : No Does patient have [facility] services? : No Does patient have P4CC services?: No  ADL Screening (condition at time of admission) Patient's cognitive ability adequate to safely complete daily activities?: Yes Is the patient deaf or have difficulty hearing?: No Does the patient have difficulty seeing, even when wearing glasses/contacts?: No Does the patient have difficulty concentrating, remembering, or making decisions?: No Patient able to express need for assistance with ADLs?: Yes Does the patient have difficulty dressing or bathing?: No Independently performs ADLs?: Yes (appropriate for developmental age) Does the patient have difficulty walking  or climbing stairs?: No (Tires easily.) Weakness of Legs: None Weakness of Arms/Hands: None       Abuse/Neglect Assessment (Assessment to be complete while patient is alone) Physical Abuse: Denies Verbal Abuse: Denies Sexual Abuse: Denies Exploitation of patient/patient's resources: Denies Self-Neglect: Denies     Regulatory affairs officer (For Healthcare) Does patient have an advance directive?: No Would patient like information on creating an advanced directive?: No - patient declined information    Additional Information 1:1 In Past 12 Months?: No CIRT Risk: No Elopement Risk: No Does patient have medical clearance?: Yes     Disposition:  Disposition Initial Assessment Completed for this Encounter: Yes Disposition of Patient: Inpatient treatment program, Referred to Type of inpatient treatment program: Adult Patient referred to:  (To be reviewed by PA)  Curlene Dolphin Ray 07/16/2015 9:46 PM

## 2015-07-16 NOTE — ED Notes (Addendum)
Pt is here because has a problem with crack cocaine.  Pt states last time he used was this.  Pt states his eyes feel like they are on fire.  Pt does use alcohol-  Last dose was yesterday.  Pt states he has had withdrawal seizures before but not recently.

## 2015-07-17 DIAGNOSIS — F1494 Cocaine use, unspecified with cocaine-induced mood disorder: Secondary | ICD-10-CM

## 2015-07-17 DIAGNOSIS — F0630 Mood disorder due to known physiological condition, unspecified: Secondary | ICD-10-CM

## 2015-07-17 DIAGNOSIS — F1994 Other psychoactive substance use, unspecified with psychoactive substance-induced mood disorder: Secondary | ICD-10-CM

## 2015-07-17 HISTORY — DX: Other psychoactive substance use, unspecified with psychoactive substance-induced mood disorder: F19.94

## 2015-07-17 LAB — CBG MONITORING, ED
Glucose-Capillary: 212 mg/dL — ABNORMAL HIGH (ref 65–99)
Glucose-Capillary: 166 mg/dL — ABNORMAL HIGH (ref 65–99)

## 2015-07-17 LAB — PROTIME-INR
INR: 1.08 (ref 0.00–1.49)
Prothrombin Time: 14.2 seconds (ref 11.6–15.2)

## 2015-07-17 MED ORDER — WARFARIN SODIUM 10 MG PO TABS
10.0000 mg | ORAL_TABLET | Freq: Once | ORAL | Status: DC
  Filled 2015-07-17: qty 1

## 2015-07-17 NOTE — Progress Notes (Signed)
Psychiatry has assessed pt today and, as he does not present with symptoms warranting inpatient psychiatric admission, is cleared for discharge. Psychiatry recommends pt be referred to drug use treatment. Pt eligible for referral to [facility] [address] [place], [place], [place] [address] Phone: [phone]). CSW called and spoke with representative to complete referral. Was informed pt will need to call admissions directly at number above (they are available 8am-8pm daily) and via phone pt can complete intake assessment, and admissions will arrange in-person intake appointment when a treatment bed is available. Information for [facility] provided in pt's d/c follow-up instructions.  Sharren Bridge, MSW, LCSW Clinical Social Work, Disposition  07/17/2015 [phone]

## 2015-07-17 NOTE — ED Notes (Signed)
Dr Louretta Shorten in w/pt.

## 2015-07-17 NOTE — ED Notes (Signed)
Patient was given a cup of water.

## 2015-07-17 NOTE — Consult Note (Signed)
[facility] Face-to-Face Psychiatry Consult   Reason for Consult:  Substance induced mood disorder and cocaine intoxication Referring Physician:  EDP Patient Identification: Dennis [name] MRN:  [id number] Principal Diagnosis: Psychoactive substance-induced mood disorder ([place]) Diagnosis:   Patient Active Problem List   Diagnosis Date Noted  . Psychoactive substance-induced mood disorder ([place]) [W09.81, F06.30] 07/17/2015  . Open wound of foot [S91.309A] 02/27/2015  . Acute CVA (cerebrovascular accident) ([place]) [I63.9] 02/07/2015  . HLD (hyperlipidemia) [E78.5]   . Status post PICC central line placement [Z95.828]   . History of ischemic right MCA stroke [Z86.73]   . Type 2 diabetes mellitus with complication (HCC) [X91.4]   . Acute on chronic systolic congestive heart failure ([place]) [I50.23]   . Stroke [facility]) [I63.9]   . Stroke with cerebral ischemia ([place]) [I63.9]   . CVA (cerebral infarction) [I63.9] 01/26/2015  . Left-sided weakness [M62.89]   . Embolic stroke involving right middle cerebral artery ([place]) [I63.411]   . Cardiac LV ejection fraction 10-20% [R94.30]   . 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 Acute systolic heart failure ([place]) [I50.21] 01/24/2015  . Acute ischemic stroke ([place]) [I63.9] 01/24/2015  . Cocaine abuse [F14.10] 01/24/2015  . H/O noncompliance with medical treatment, presenting hazards to health [Z91.19] 01/24/2015  . Tobacco abuse [Z72.0] 01/24/2015  . Essential hypertension [I10] 01/24/2015  . Hypotension [I95.9] 01/24/2015  . Hyperlipidemia [E78.5] 01/24/2015  . Dysphagia following cerebral infarction [I69.391] 01/24/2015  . Hemiplegia affecting left nondominant side ([place]) [G81.94] 01/24/2015  . Diabetes mellitus type 2, uncontrolled, with complications ([place]) [N82.9, E11.65] 01/24/2015  . Hyperlipidemia associated with type 2 diabetes mellitus ([place]) [E11.69, E78.5] 11/30/2014  . CAD (coronary artery disease) [I25.10] 11/16/2014  . Acute on chronic systolic  CHF (congestive heart failure) ([name]) [I50.23] 09/21/2014  . Chest pain on breathing [R07.1] 09/21/2014  . Chronic systolic heart failure ([place]) [I50.22] 06/20/2014  . Acute on chronic systolic heart failure ([place]) [I50.23]   . Automatic implantable cardioverter-defibrillator in situ [Z95.810]   . 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 Dyspnea [R06.00] 05/26/2014  . Chest discomfort [R07.89] 08/09/2013  . Long term (current) use of anticoagulants [Z79.01] 08/09/2013  . Drug abuse and dependence ([place]) [F19.20]   . CHF (congestive heart failure) ([place]) [I50.9]   . BP (high blood pressure) [I10] 03/04/2013  . Cardiomyopathy, ischemic [I25.5] 07/21/2011  . Type II diabetes mellitus with ophthalmic manifestations ([place]) [E11.39] 09/13/2007  . TOBACCO ABUSE [F17.200] 09/13/2007  . WEIGHT LOSS, ABNORMAL [R63.4] 09/13/2007  . SOB [R06.02] 09/13/2007    Total Time spent with patient: 45 minutes  Subjective:   Dennis [name] is a 54 y.o. male patient admitted with cocaine intoxication.  HPI:  Dennis [name] is an 54 y.o. male seen, chart reviewed for face-to-face psychiatry consultation and evaluation of substance induced mood disorder. Patient seen lying on his bed, calm, cooperative with the depressed mood and constricted affect. Patient has no known irritability, agitation or aggressive behaviors. Patient has no symptoms of alcohol or cocaine withdrawal. Patient has been following up with his cardiologist and family medicine for chronic medical problems and compliant with this medication management. Patient does not required detox treatment at this time. Patient reported he has been drinking alcohol about 12 pack of beer daily and cocaine regularly which made him sick and tired. Patient reported he has no previous history of substance abuse treatment or mental health treatment. Patient is asking help for substance abuse treatment and agree to [facility] in [facility] if  available. Patient denies current symptoms of depression, anxiety, mania, psychosis, suicidal/homicidal ideation, intention or plan. Patient urine drug screen is positive for cocaine. Patient has no previous history of acute psychiatric hospitalizations or suicidal ideations, intention or attempts. Patient denied disturbance of sleep and appetite. Patient lives with his mother, has a 57 years old daughter who lives with her mother. Patient was noted married or never lived together with daughters mother. Patient contract for safety and willing to participate in substance abuse treatment program. Patient stated that he may relapse drug of abuse and go home without plan of treatment.    Past Psychiatric History:  Patient has no previous history of acute psychiatric inpatient or outpatient treatments.  Risk to Self: Suicidal Ideation: Yes-Currently Present Suicidal Intent: Yes-Currently Present Is patient at risk for suicide?: Yes Suicidal Plan?: Yes-Currently Present Specify Current Suicidal Plan: "To jump from a building." Access to Means: Yes Specify Access to Suicidal Means: Tall buildings or parking decks What has been your use of drugs/alcohol within the last 12 months?: ETOH & crack How many times?: 0 Other Self Harm Risks: SA issues Triggers for Past Attempts: None known Intentional Self Injurious Behavior: None Risk to Others: Homicidal Ideation: No-Not Currently/Within Last 6 Months Thoughts of Harm to Others: No-Not Currently Present/Within Last 6 Months Current Homicidal Intent: No Current Homicidal Plan: No Access to Homicidal Means: No Identified Victim: "No one in particular" History of harm to others?: Yes Assessment of Violence: In past 6-12 months Violent Behavior Description: This past summer. Does patient have access to weapons?: Yes (Comment) ("If I want one I know where to get it.") Criminal Charges Pending?: No Does patient have a court date: No Prior Inpatient  Therapy: Prior Inpatient Therapy: No Prior Therapy Dates: None Prior Therapy Facilty/Provider(s): None Reason for Treatment: None Prior Outpatient Therapy: Prior Outpatient Therapy: No Prior Therapy Dates: None Prior Therapy Facilty/Provider(s): N/A Reason for Treatment: N/a Does patient have an ACCT team?: No Does patient have Intensive In-House Services?  : No Does patient have [facility] services? : No Does patient have P4CC services?: No  Past Medical History:  Past Medical History  Diagnosis Date  . Chronic systolic heart failure (HCC)     a) Mixed ICM/NICM b) RHC (05/2014): RA 2, RV 19/2/3, PA 22/14 (18), PCWP 6, Fick CO/CI: 5.2 / 2.7, PVR 2.3 WU, PA 60% and 64% c) ECHO (05/2014): EF 20-25%, diff HK, akinesis entireanteroseptal myocardium, triv AI, mod MR, LA mod/sev dilated  . Automatic implantable cardioverter-defibrillator in situ   . Heart murmur   . Sleep apnea     "cleared after T&A"  . 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 Ischemic cardiomyopathy     a) Coronary angiography (12/2008) at [facility]: Lmain: nl, LAD mid 100% stenosis with left to left and right-to-left collaterals to the distal LAD; Lcx: nl, RCA nl.    . Type II diabetes mellitus ([place])   . High cholesterol   . Pneumonia 1988  . Drug abuse and dependence ([place])   . CHF (congestive heart failure) [facility])     Past Surgical History  Procedure Laterality Date  . Forearm fracture surgery Left 1980  . Cardiac defibrillator placement  03/2011  . Cholecystectomy  1994  . Right heart catheterization N/A 05/30/2014    Procedure: RIGHT HEART CATH;  Surgeon: Jolaine Artist, MD;  Location[facility] C[facility] LAB;  Service: Cardiovascular;  Laterality: N/A;  . Fracture surgery    . Cardiac catheterization  05/2014  . Tonsillectomy  and adenoidectomy  ~ 1998  . Right heart catheterization N/A 02/07/2015    Procedure: RIGHT HEART CATH;  Surgeon: Jolaine Artist, MD;  Location: [facility] CATH LAB;  Service: Cardiovascular;  Laterality: N/A;    Family History:  Family History  Problem Relation Age of Onset  . Diabetes Mother   . Heart disease Mother   . Diabetes Father   . Prostate cancer Father   . Heart disease Father   . Diabetes Brother   . Diabetes Brother    Family Psychiatric  History: patient has no family history of mental health problems or substance abuse.  Social History:  History  Alcohol Use  . 0.0 oz/week  . 0 Standard drinks or equivalent per week    Comment: 09/21/2014 "might have a drink q 6 /months, if that"     History  Drug Use  . Yes  . Special: Cocaine    Comment: 09/21/2014 "last cocaine ~ 1 1/2 months ago"    Social History   Social History  . Marital Status: Single    Spouse Name: N/A  . Number of Children: 1  . Years of Education: 14   Occupational History  . Disabled    Social History Main Topics  . Smoking status: Current Some Day Smoker -- 0.10 packs/day for 31 years    Types: Cigarettes  . Smokeless tobacco: Never Used  . Alcohol Use: 0.0 oz/week    0 Standard drinks or equivalent per week     Comment: 09/21/2014 "might have a drink q 6 /months, if that"  . Drug Use: Yes    Special: Cocaine     Comment: 09/21/2014 "last cocaine ~ 1 1/2 months ago"  . Sexual Activity: Yes   Other Topics Concern  . None   Social History Narrative   Lives in [place] with his mother. No pets. Fun: movies, sports, daughter.    Denies religious beliefs that would effect health care.    Additional Social History:    Pain Medications: None Prescriptions: See PTA medication list Over the Counter: See PTA medication list History of alcohol / drug use?: Yes Longest period of sobriety (when/how long): 4 months in 2008 Withdrawal Symptoms: Nausea / Vomiting, Diarrhea, Weakness, Patient aware of relationship between substance abuse and physical/medical complications, Seizures, Fever / Chills, Sweats Onset of Seizures: Withdrawal Date of most recent seizure: "at the beginning of this  year (2016)" Name of Substance 1: ETOH (beer & liquor) 1 - Age of First Use: 54 years of age 54 - Amount (size/oz): Pint of liquor & a 12 pack of beer 1 - Frequency: Daily 1 - Duration: Last 5 years 1 - Last Use / Amount: 10/02 (12 pack) Name of Substance 2: Crack cocaine 2 - Age of First Use: In his 20's 2 - Amount (size/oz): About an ounce in the last 3 days 2 - Frequency: Daily 2 - Duration: Last three days.  Usually 4-5 days in a week 2 - Last Use / Amount: 10/03                 Allergies:  No Known Allergies  Labs:  Results for orders placed or performed during the hospital encounter of 07/16/15 (from the past 48 hour(s))  Digoxin level     Status: Abnormal   Collection Time: 07/16/15  1:07 PM  Result Value Ref Range   Digoxin Level <0.2 (L) 0.8 - 2.0 ng/mL    Comment: RESULTS CONFIRMED BY MANUAL DILUTION  CBC with Differential     Status: None   Collection Time: 07/16/15  1:08 PM  Result Value Ref Range   WBC 9.9 4.0 - 10.5 K/uL   RBC 4.34 4.22 - 5.81 MIL/uL   Hemoglobin 13.0 13.0 - 17.0 g/dL   HCT 39.1 39.0 - 52.0 %   MCV 90.1 78.0 - 100.0 fL   MCH 30.0 26.0 - 34.0 pg   MCHC 33.2 30.0 - 36.0 g/dL   RDW 12.8 11.5 - 15.5 %   Platelets 193 150 - 400 K/uL   Neutrophils Relative % 72 %   Neutro Abs 7.2 1.7 - 7.7 K/uL   Lymphocytes Relative 19 %   Lymphs Abs 1.9 0.7 - 4.0 K/uL   Monocytes Relative 8 %   Monocytes Absolute 0.8 0.1 - 1.0 K/uL   Eosinophils Relative 1 %   Eosinophils Absolute 0.1 0.0 - 0.7 K/uL   Basophils Relative 0 %   Basophils Absolute 0.0 0.0 - 0.1 K/uL  Comprehensive metabolic panel     Status: Abnormal   Collection Time: 07/16/15  1:08 PM  Result Value Ref Range   Sodium 132 (L) 135 - 145 mmol/L   Potassium 3.9 3.5 - 5.1 mmol/L   Chloride 98 (L) 101 - 111 mmol/L   CO2 24 22 - 32 mmol/L   Glucose, Bld 455 (H) 65 - 99 mg/dL   BUN 17 6 - 20 mg/dL   Creatinine, Ser 1.23 0.61 - 1.24 mg/dL   Calcium 9.4 8.9 - 10.3 mg/dL   Total Protein 6.7  6.5 - 8.1 g/dL   Albumin 3.8 3.5 - 5.0 g/dL   AST 25 15 - 41 U/L   ALT 16 (L) 17 - 63 U/L   Alkaline Phosphatase 85 38 - 126 U/L   Total Bilirubin 1.4 (H) 0.3 - 1.2 mg/dL   GFR calc non Af Amer >60 >60 mL/min   GFR calc Af Amer >60 >60 mL/min    Comment: (NOTE) The eGFR has been calculated using the CKD EPI equation. This calculation has not been validated in all clinical situations. eGFR's persistently <60 mL/min signify possible Chronic Kidney Disease.    Anion gap 10 5 - 15  Protime-INR     Status: Abnormal   Collection Time: 07/16/15  1:08 PM  Result Value Ref Range   Prothrombin Time 15.7 (H) 11.6 - 15.2 seconds   INR 1.23 0.00 - 1.49  I-Stat Troponin, ED (not at [facility])     Status: None   Collection Time: 07/16/15  1:30 PM  Result Value Ref Range   Troponin i, poc 0.01 0.00 - 0.08 ng/mL   Comment 3            Comment: Due to the release kinetics of cTnI, a negative result within the first hours of the onset of symptoms does not rule out myocardial infarction with certainty. If myocardial infarction is still suspected, repeat the test at appropriate intervals.   Urine rapid drug screen (hosp performed)     Status: Abnormal   Collection Time: 07/16/15  5:15 PM  Result Value Ref Range   Opiates NONE DETECTED NONE DETECTED   Cocaine POSITIVE (A) NONE DETECTED   Benzodiazepines NONE DETECTED NONE DETECTED   Amphetamines NONE DETECTED NONE DETECTED   Tetrahydrocannabinol NONE DETECTED NONE DETECTED   Barbiturates NONE DETECTED NONE DETECTED    Comment:        DRUG SCREEN FOR MEDICAL PURPOSES ONLY.  IF CONFIRMATION IS NEEDED FOR ANY  PURPOSE, NOTIFY LAB WITHIN 5 DAYS.        LOWEST DETECTABLE LIMITS FOR URINE DRUG SCREEN Drug Class       Cutoff (ng/mL) Amphetamine      1000 Barbiturate      200 Benzodiazepine   810 Tricyclics       175 Opiates          300 Cocaine          300 THC              50   Urinalysis, Routine w reflex microscopic     Status: Abnormal    Collection Time: 07/16/15  5:15 PM  Result Value Ref Range   Color, Urine YELLOW YELLOW   APPearance CLEAR CLEAR   Specific Gravity, Urine 1.040 (H) 1.005 - 1.030   pH 5.0 5.0 - 8.0   Glucose, UA >1000 (A) NEGATIVE mg/dL   Hgb urine dipstick TRACE (A) NEGATIVE   Bilirubin Urine NEGATIVE NEGATIVE   Ketones, ur 15 (A) NEGATIVE mg/dL   Protein, ur NEGATIVE NEGATIVE mg/dL   Urobilinogen, UA 1.0 0.0 - 1.0 mg/dL   Nitrite NEGATIVE NEGATIVE   Leukocytes, UA NEGATIVE NEGATIVE  Urine microscopic-add on     Status: Abnormal   Collection Time: 07/16/15  5:15 PM  Result Value Ref Range   Squamous Epithelial / LPF RARE RARE   WBC, UA 0-2 <3 WBC/hpf   Bacteria, UA RARE RARE   Casts HYALINE CASTS (A) NEGATIVE  POC CBG, ED     Status: Abnormal   Collection Time: 07/16/15  5:41 PM  Result Value Ref Range   Glucose-Capillary 276 (H) 65 - 99 mg/dL  CBG monitoring, ED     Status: Abnormal   Collection Time: 07/16/15  9:58 PM  Result Value Ref Range   Glucose-Capillary 335 (H) 65 - 99 mg/dL  Protime-INR     Status: None   Collection Time: 07/17/15  6:00 AM  Result Value Ref Range   Prothrombin Time 14.2 11.6 - 15.2 seconds   INR 1.08 0.00 - 1.49  CBG monitoring, ED     Status: Abnormal   Collection Time: 07/17/15  7:17 AM  Result Value Ref Range   Glucose-Capillary 212 (H) 65 - 99 mg/dL   Comment 1 Notify RN    Comment 2 Document in Chart     Current Facility-Administered Medications  Medication Dose Route Frequency Provider Last Rate Last Dose  . atorvastatin (LIPITOR) tablet 20 mg  20 mg Oral q1800 Davonna Belling, MD      . digoxin Fonnie Birkenhead) tablet 0.25 mg  0.25 mg Oral Daily Davonna Belling, MD   0.25 mg at 07/16/15 1936  . furosemide (LASIX) tablet 40 mg  40 mg Oral Daily PRN Davonna Belling, MD      . insulin aspart (novoLOG) injection 0-15 Units  0-15 Units Subcutaneous TID WC Davonna Belling, MD   5 Units at 07/17/15 [phone]  . insulin aspart (novoLOG) injection 0-5 Units  0-5  Units Subcutaneous QHS Davonna Belling, MD   4 Units at 07/16/15 2205  . insulin glargine (LANTUS) injection 30 Units  30 Units Subcutaneous Q2200 Davonna Belling, MD   30 Units at 07/16/15 2206  . losartan (COZAAR) tablet 12.5 mg  12.5 mg Oral Daily Davonna Belling, MD      . spironolactone (ALDACTONE) tablet 12.5 mg  12.5 mg Oral Daily Davonna Belling, MD      . Warfarin - Pharmacist Dosing Inpatient   Does  not apply q1800 Linda Hedges, [facility]       Current Outpatient Prescriptions  Medication Sig Dispense Refill  . atorvastatin (LIPITOR) 20 MG tablet Take 1 tablet (20 mg total) by mouth daily at 6 PM. 30 tablet 3  . digoxin (LANOXIN) 0.25 MG tablet Take 1 tablet (0.25 mg total) by mouth daily. 30 tablet 0  . furosemide (LASIX) 40 MG tablet Take 1 tablet (40 mg total) by mouth daily as needed (weight greater than 159 pounds). 30 tablet 3  . hydrocerin (EUCERIN) CREA Apply 1 application topically 2 (two) times daily.  0  . Insulin Glargine (LANTUS) 100 UNIT/ML Solostar Pen Inject 30 Units into the skin daily at 10 pm. Dispense pen and needles 15 mL 1  . losartan (COZAAR) 25 MG tablet Take 0.5 tablets (12.5 mg total) by mouth daily. 15 tablet 3  . spironolactone (ALDACTONE) 25 MG tablet Take 0.5 tablets (12.5 mg total) by mouth daily. 30 tablet 1  . warfarin (COUMADIN) 7.5 MG tablet Take as directed by coumadin clinic (Patient taking differently: Take 7.5 mg by mouth daily at 6 PM. ) 30 tablet 1  . glucose blood (ONETOUCH VERIO) test strip Use as instructed 100 each 12  . insulin aspart (NOVOLOG FLEXPEN) 100 UNIT/ML FlexPen Use as directed. (Patient not taking: Reported on 07/16/2015) 15 mL 11  . QC PEN NEEDLES 31G X 6 MM MISC 1 each by Other route See admin instructions. Use daily with insulin.      Musculoskeletal: Strength & Muscle Tone: decreased Gait & Station: unable to stand Patient leans: N/A  Psychiatric Specialty Exam: ROS denied generalized body pains, nausea, vomiting,  sweating, shaking, shortness of breath and chest pain.  No Fever-chills, No Headache, No changes with Vision or hearing, reports vertigo No problems swallowing food or Liquids, No Chest pain, Cough or Shortness of Breath, No Abdominal pain, No Nausea or Vommitting, Bowel movements are regular, No Blood in stool or Urine, No dysuria, No new skin rashes or bruises, No new joints pains-aches,  No new weakness, tingling, numbness in any extremity, No recent weight gain or loss, No polyuria, polydypsia or polyphagia,   A full 10 point Review of Systems was done, except as stated above, all other Review of Systems were negative.  Blood pressure 95/71, pulse 91, temperature 98.1 F (36.7 C), temperature source Oral, resp. rate 12, SpO2 99 %.There is no weight on file to calculate BMI.  General Appearance: Guarded  Eye Contact::  Good  Speech:  Clear and Coherent and Slow  Volume:  Decreased  Mood:  Anxious and Depressed  Affect:  Constricted and Depressed  Thought Process:  Coherent and Goal Directed  Orientation:  Full (Time, Place, and Person)  Thought Content:  WDL  Suicidal Thoughts:  No  Homicidal Thoughts:  No  Memory:  Immediate;   Good Recent;   Good Remote;   Good  Judgement:  Intact  Insight:  Good  Psychomotor Activity:  Decreased  Concentration:  Good  Recall:  Good  Fund of Knowledge:Good  Language: Good  Akathisia:  Negative  Handed:  Right  AIMS (if indicated):     Assets:  Communication Skills Desire for Improvement Financial Resources/Insurance Housing Leisure Time Resilience Social Support Transportation  ADL's:  Intact  Cognition: WNL  Sleep:      Treatment Plan Summary: Daily contact with patient to assess and evaluate symptoms and progress in treatment and Medication management  Disposition: Case discussed with staff RN and also  psychiatric shows service who will be processing the referral to substance abuse rehabilitation. Discontinue Child psychotherapist as patient has no active suicidal ideation and contracts for safety. Patient does not meet criteria for psychiatric inpatient admission. Supportive therapy provided about ongoing stressors. Patient will be referred to the residential chemical dependency rehabilitation when medically stable Appreciate psychiatric consultation and we sign off Please contact [phone] or [phone] if needs further assistance   JONNALAGADDA,JANARDHAHA R. 07/17/2015 9:53 AM

## 2015-07-17 NOTE — ED Notes (Signed)
Per Jarrett Soho, SW, attempting to find placement either at [facility] or [facility].

## 2015-07-17 NOTE — ED Notes (Signed)
Patient was given a snack, diet ginger ale and water.

## 2015-07-17 NOTE — ED Notes (Signed)
Patient CBG was 212, Nurse was informed.

## 2015-07-17 NOTE — Discharge Instructions (Signed)
°Emergency Department Resource Guide °1) Find a Doctor and Pay Out of Pocket °Although you won't have to find out who is covered by your insurance plan, it is a good idea to ask around and get recommendations. You will then need to call the office and see if the doctor you have chosen will accept you as a new patient and what types of options they offer for patients who are self-pay. Some doctors offer discounts or will set up payment plans for their patients who do not have insurance, but you will need to ask so you aren't surprised when you get to your appointment. ° °2) Contact Your Local Health Department °Not all health departments have doctors that can see patients for sick visits, but many do, so it is worth a call to see if yours does. If you don't know where your local health department is, you can check in your phone book. The CDC also has a tool to help you locate your state's health department, and many state websites also have listings of all of their local health departments. ° °3) Find a Walk-in Clinic °If your illness is not likely to be very severe or complicated, you may want to try a walk in clinic. These are popping up all over the country in pharmacies, drugstores, and shopping centers. They're usually staffed by nurse practitioners or physician assistants that have been trained to treat common illnesses and complaints. They're usually fairly quick and inexpensive. However, if you have serious medical issues or chronic medical problems, these are probably not your best option. ° °No Primary Care Doctor: °- Call [facility] at  832-8000 - they can help you locate a primary care doctor that  accepts your insurance, provides certain services, etc. °- Physician Referral Service- 1-800-533-3463 ° °Chronic Pain Problems: °Organization         Address  Phone   Notes  °[facility]  (336) 297-2271 Patients need to be referred by their primary care doctor.  ° °Medication  Assistance: °Organization         Address  Phone   Notes  °[place] [facility] [address] °[place], [place] [address] (336) 641-8030 --Must be a resident of [place] °-- Must have NO insurance coverage whatsoever (no Medicaid/ Medicare, etc.) °-- The pt. MUST have a primary care doctor that directs their care regularly and follows them in the community °  °[facility]  (866) 331-1348   °[employer]  (888) 892-1162   ° °Agencies that provide inexpensive medical care: °Organization         Address  Phone   Notes  °[facility]  (336) 832-8035   °[facility]    (336) 832-7272   °[facility] [address] °[place], [place] [address] (336) 832-4777   °[facility] [facility] [address], °[place] (336) 271-4999   °[facility]    (336) 373-0678   °[facility]    (336) 272-1050   °[facility] ° [address], [place] Phone:  (336) 832-4444, Fax:  (336) 832-4440 Hours of Operation:  9 am - 6 pm, M-F.  Also accepts Medicaid/Medicare and self-pay.  °[facility] ° [address], [place] Phone: (336) 832-3150, Fax: (336) 832-3151. Hours of Operation:  8:30 am - 5:30 pm, M-F.  Also accepts Medicaid and self-pay.  °[facility] [place] [address], [place] Phone: (336) 878-6027   °[facility] [address], [place], [place] (336)723-1848, Ext. 123 Mondays & Thursdays: 7-9 AM.  First 15 patients are seen on a first come, first serve basis. °  ° °Medicaid-accepting [place] Providers: ° °Organization         Address  Phone   Notes  °[facility] [address], [place] (336) 641-2100 Also accepts self-pay patients.  °[facility] [address], [place] ° (336) 856-9996   °[facility] [address], [place]  (336) 288-8857   °[facility] [address] [place] [address], [place] (336) 299-7000   °Veita Bland [address], [place]  ° (336) 373-1557 Only accepts [place] Medicaid patients after they have their name applied to their card.  ° °Self-Pay (no insurance) in [place]: ° °Organization         Address  Phone   Notes  °Sickle Cell Patients, [facility] [address], [place] (336) 832-1970   °[facility] [address], [place] (336) 832-4400   °[facility] ° [address], [place] (336) 992-4800   °[facility]/Dr. Osei-Bonsu ° [address] [place] [address], [place] or [address], [place] (336) 841-8500 Phone number for both [place] and [place] locations is the same.  °[facility] [address], [place] (336) 299-0000   °[facility] [address] [place] [address], [place] or [address] (336) 852-7530 °(336) 878-2260   °[facility] [address], [place] (336) 350-1642, phone; (336) 294-5005, fax Sees patients 1st and 3rd Saturday of every month.  Must not qualify for public or private insurance (i.e. Medicaid, Medicare, [place] Health Choice, Veterans' Benefits) • Household income should be no more than 200% of the poverty level •The clinic cannot treat you if you are pregnant or think you are pregnant • Sexually transmitted diseases are not treated at the clinic.  ° ° °Dental Care: °Organization         Address  Phone  Notes  °[place] [facility] [address], [place] (336) 641-6152 Accepts children up to age 21 who are enrolled in Medicaid or [place] Health Choice; pregnant women with a Medicaid card; and children who have applied for Medicaid or [place] Health Choice, but were declined, whose parents can pay a reduced fee at time of service.  °[place]  [facility] [place]  [address], [place] (336) 641-7733 Accepts children up to age 21 who are enrolled in Medicaid or [place] Health Choice; pregnant women with a Medicaid card; and children who have applied for Medicaid or [place] Health Choice, but were declined, whose parents can pay a reduced fee at time of service.  °[facility] ° [address], [place] (336) 641-4533 Patients are seen by appointment only. Walk-ins are not accepted. [facility] will see patients 18 years of age and older. °Monday - Tuesday (8am-5pm) °Most Wednesdays (8:30-5pm) °$30 per visit, cash only  °[facility] ° [address], [place] (336) 641-4533 Patients are seen by appointment only. Walk-ins are not accepted. [facility] will see patients 18 years of age and older. °One   Wednesday Evening (Monthly: Volunteer Based).  $30 per visit, cash only  °[facility]  (919) 537-3737 for adults; Children under age 4, call Graduate Pediatric Dentistry at ([phone]) [phone]. Children aged 4-14, please call (919) [phone] to request a pediatric application. ° Dental services are provided in all areas of dental care including fillings, crowns and bridges, complete and partial dentures, implants, gum treatment, root canals, and extractions. Preventive care is also provided. Treatment is provided to both adults and children. °Patients are selected via a lottery and there is often a waiting list. °  °[facility] [address], °[place] ° (336) 763-8833 [email or web address] °  °[facility] [address], [place], [place] (336)723-1848, Ext. 123 Second and Fourth Thursday of each month, opens at 6:30 AM; Clinic ends at 9 AM.  Patients are seen on a first-come first-served basis, and a limited number are seen during each clinic.  ° °[facility] ° [address], [place], [place] (336) 723-7904    Eligibility Requirements °You must have lived in [place], [place], or [place] counties for at least the last three months. °  You cannot be eligible for state or federal sponsored healthcare insurance, including Veterans Administration, Medicaid, or Medicare. °  You generally cannot be eligible for healthcare insurance through your employer.  °  How to apply: °Eligibility screenings are held every Tuesday and Wednesday afternoon from 1:00 pm until 4:00 pm. You do not need an appointment for the interview!  °[facility] [address], [place], [place] 336-631-2330   °[facility] [facility]  336-342-8273   °[facility]  336-703-3100   °[facility]  336-570-6415   ° °Behavioral Health Resources in the Community: °Intensive Outpatient Programs °Organization         Address  Phone  Notes  °[facility] [address], [place], [place] 336-878-6098   °[facility] [address], [place], [place] 336-832-9800   °[facility] [address], [place], [place] ° 336-882-2125   °[facility] [address],  °[place], [place] 1-800-853-5163 or 336-641-4981   °Substance Abuse Resources °Organization         Address  Phone  Notes  °[facility]  336-882-2125   °[facility]  336-784-9470   °[facility]  336-285-9073   °[facility]  336-845-3988   °Residential & Outpatient Substance Abuse Program  1-800-659-3381   °Psychological Services °Organization         Address  Phone  Notes  °[facility]  336- 832-9600   °[facility]  336- 378-7881   °[facility] [address], [place] 1-800-853-5163 or 336-641-4981   ° °Mobile Crisis Teams °Organization         Address  Phone  Notes  °[facility], Mobile Crisis Care Unit  1-877-626-1772   °Assertive °[facility] ° [address]  [place], [place] 336-834-9664   °Sharon DeEsch [address] °[place] 336-554-5454   ° °Self-Help/Support Groups °Organization         Address  Phone             Notes  °[facility]  - variety of support groups  336- 373-1402 Call for more information  °Narcotics Anonymous (NA), [facility] [address], °[place]  2 meetings at this location  ° °  Residential Treatment Programs °Organization         Address  Phone  Notes  °[facility] [address],    °[place]  1-866-801-8205   °[facility] ° [address], [place], [place] 704-293-8524   °[facility] [address], [place] 336-845-3988 Admissions: 8am-3pm M-F  °[facility] [address]    °[place], [place] 336-841-1104   °[facility] [address], [place], [place] 336-379-7146   °[facility] [address]  °[place], [place] 336-285-9073   °[facility] Intensive Outpatient [address], [place], [place] 336-852-3033   °[facility] [address]  °[place], [place] 1-877-615-2722 or 336-784-9470   °[facility] [address] [place], [place] 336-227-7417 Accepts Medicaid  °[facility] [address]  °[place] 1-800-659-3381 Substance Abuse/Addiction Treatment  ° °[place] Behavioral Health Resources °Organization         Address  Phone  Notes  °[facility]  (888) 581-9988   °Julie Brannon, PhD [address] [place], [place]   (336) 349-5553 or (336) 951-0000   °[facility]   [address] °[place], [place] (336) 349-4454   °[facility] [address], [place], [place] (336) 342-8316 Insurance/Medicaid/sponsorship through [facility]  °[facility] [address]                                    [place], [place] (336) 342-8316 Therapy/tele-psych/case    °[facility] [address]  ° [place], [place] (336) 349-2233    °Dr. Arfeen  (336) 349-4544   °[facility] [place]  [employer] [place] [facility] 1) [address], [place] °2) [address], [place] °3)  [address], [place] (336) 349-3220 °(336) 342-7768 ° °(336) 342-8140   °[place] [facility] (336) 342-1394 or (336) 342-3537 (After Hours)    ° ° °

## 2015-07-17 NOTE — ED Notes (Signed)
Jarrett Soho, SW, aware psych's consult note has been entered.

## 2015-07-17 NOTE — Progress Notes (Signed)
ANTICOAGULATION CONSULT NOTE - Follow Up Consult  Pharmacy Consult for warfarin Indication: Left ventricular noncompaction  No Known Allergies  Patient Measurements:    Vital Signs: Temp: 98.1 F (36.7 C) (10/04 0615) Temp Source: Oral (10/04 0615) BP: 95/71 mmHg (10/04 0615) Pulse Rate: 78 (10/04 1007)  Labs:  Recent Labs  07/16/15 1308 07/17/15 0600  HGB 13.0  --   HCT 39.1  --   PLT 193  --   LABPROT 15.7* 14.2  INR 1.23 1.08  CREATININE 1.23  --     CrCl cannot be calculated (Unknown ideal weight.).   Medications:   (Not in a hospital admission)  Assessment: 4 yoM on warfarin for Left ventricular noncompaction w/ an INR goal of 2-3. Most recently was taking 7.5 mg daily except 3.75 mg on Mon/Fri. INR down to 1.08 today. H/H and Plt wnl   Goal of Therapy:  INR 2-3 Monitor platelets by anticoagulation protocol: Yes   Plan:  Repeat warfarin 10 mg x1 to get patient back to goal range Daily INR CBC q72h   Albertina Parr, PharmD., BCPS Clinical Pharmacist Pager [phone]

## 2015-08-02 ENCOUNTER — Inpatient Hospital Stay (HOSPITAL_COMMUNITY): Admission: RE | Admit: 2015-08-02 | Payer: Medicare Other | Source: Ambulatory Visit

## 2015-08-08 ENCOUNTER — Other Ambulatory Visit (HOSPITAL_COMMUNITY): Payer: Self-pay | Admitting: Adult Health

## 2015-08-09 ENCOUNTER — Telehealth (HOSPITAL_COMMUNITY): Payer: Self-pay

## 2015-08-09 NOTE — Telephone Encounter (Signed)
Hurley Cisco calling to get current list of patient medications.  Latest record of medications printed and faxed to [phone])

## 2015-08-10 ENCOUNTER — Telehealth (HOSPITAL_COMMUNITY): Payer: Self-pay

## 2015-08-10 MED ORDER — DIGOXIN 250 MCG PO TABS
0.2500 mg | ORAL_TABLET | Freq: Every day | ORAL | Status: DC
Start: 2015-08-10 — End: 2015-10-17

## 2015-08-10 NOTE — Telephone Encounter (Signed)
No Notes needed for the duplicate encounter

## 2015-08-10 NOTE — Telephone Encounter (Signed)
Patient called and said that he would like to go into drug and alcohol rehab but her needs a letter from Dr. Haroldine Laws that he is cleared for rehab  Also asked that I send a list of his current medications to the rehab center which is being done for him today

## 2015-08-10 NOTE — Telephone Encounter (Signed)
Called in RX for DIgoxin refill

## 2015-08-16 ENCOUNTER — Encounter (HOSPITAL_COMMUNITY): Payer: Self-pay | Admitting: Emergency Medicine

## 2015-08-16 ENCOUNTER — Emergency Department (HOSPITAL_COMMUNITY)
Admission: EM | Admit: 2015-08-16 | Discharge: 2015-08-17 | Disposition: A | Discharge status: Other Acute Inpatient Hospital | Payer: Medicare Other | Attending: Emergency Medicine | Admitting: Emergency Medicine

## 2015-08-16 ENCOUNTER — Telehealth (HOSPITAL_COMMUNITY): Payer: Self-pay | Admitting: Vascular Surgery

## 2015-08-16 DIAGNOSIS — R45851 Suicidal ideations: Secondary | ICD-10-CM

## 2015-08-16 DIAGNOSIS — Z720 Tobacco use: Secondary | ICD-10-CM | POA: Insufficient documentation

## 2015-08-16 DIAGNOSIS — Z95810 Presence of automatic (implantable) cardiac defibrillator: Secondary | ICD-10-CM | POA: Insufficient documentation

## 2015-08-16 DIAGNOSIS — I5022 Chronic systolic (congestive) heart failure: Secondary | ICD-10-CM | POA: Insufficient documentation

## 2015-08-16 DIAGNOSIS — R011 Cardiac murmur, unspecified: Secondary | ICD-10-CM | POA: Insufficient documentation

## 2015-08-16 DIAGNOSIS — Z7901 Long term (current) use of anticoagulants: Secondary | ICD-10-CM | POA: Insufficient documentation

## 2015-08-16 DIAGNOSIS — Z8701 Personal history of pneumonia (recurrent): Secondary | ICD-10-CM | POA: Insufficient documentation

## 2015-08-16 DIAGNOSIS — E7800 Pure hypercholesterolemia, unspecified: Secondary | ICD-10-CM | POA: Insufficient documentation

## 2015-08-16 DIAGNOSIS — Z8669 Personal history of other diseases of the nervous system and sense organs: Secondary | ICD-10-CM | POA: Insufficient documentation

## 2015-08-16 DIAGNOSIS — Z794 Long term (current) use of insulin: Secondary | ICD-10-CM | POA: Insufficient documentation

## 2015-08-16 DIAGNOSIS — Z79899 Other long term (current) drug therapy: Secondary | ICD-10-CM | POA: Insufficient documentation

## 2015-08-16 DIAGNOSIS — Z9889 Other specified postprocedural states: Secondary | ICD-10-CM | POA: Insufficient documentation

## 2015-08-16 DIAGNOSIS — E119 Type 2 diabetes mellitus without complications: Secondary | ICD-10-CM | POA: Insufficient documentation

## 2015-08-16 DIAGNOSIS — F329 Major depressive disorder, single episode, unspecified: Secondary | ICD-10-CM | POA: Insufficient documentation

## 2015-08-16 DIAGNOSIS — F1410 Cocaine abuse, uncomplicated: Secondary | ICD-10-CM | POA: Insufficient documentation

## 2015-08-16 LAB — CBC
HCT: 45.4 % (ref 39.0–52.0)
Hemoglobin: 15.3 g/dL (ref 13.0–17.0)
MCH: 30.8 pg (ref 26.0–34.0)
MCHC: 33.7 g/dL (ref 30.0–36.0)
MCV: 91.5 fL (ref 78.0–100.0)
Platelets: 201 10*3/uL (ref 150–400)
RBC: 4.96 MIL/uL (ref 4.22–5.81)
RDW: 12.4 % (ref 11.5–15.5)
WBC: 12.2 10*3/uL — ABNORMAL HIGH (ref 4.0–10.5)

## 2015-08-16 LAB — COMPREHENSIVE METABOLIC PANEL
ALT: 14 U/L — ABNORMAL LOW (ref 17–63)
AST: 21 U/L (ref 15–41)
Albumin: 3.9 g/dL (ref 3.5–5.0)
Alkaline Phosphatase: 96 U/L (ref 38–126)
Anion gap: 12 (ref 5–15)
BUN: 12 mg/dL (ref 6–20)
CO2: 27 mmol/L (ref 22–32)
Calcium: 9.8 mg/dL (ref 8.9–10.3)
Chloride: 97 mmol/L — ABNORMAL LOW (ref 101–111)
Creatinine, Ser: 1.29 mg/dL — ABNORMAL HIGH (ref 0.61–1.24)
GFR calc Af Amer: >60 mL/min (ref >60)
GFR calc non Af Amer: >60 mL/min (ref >60)
Glucose, Bld: 369 mg/dL — ABNORMAL HIGH (ref 65–99)
Potassium: 3.7 mmol/L (ref 3.5–5.1)
Sodium: 136 mmol/L (ref 135–145)
Total Bilirubin: 0.7 mg/dL (ref 0.3–1.2)
Total Protein: 6.9 g/dL (ref 6.5–8.1)

## 2015-08-16 LAB — ETHANOL: Alcohol, Ethyl (B): <5 mg/dL (ref <5)

## 2015-08-16 LAB — SALICYLATE LEVEL: Salicylate Lvl: <4 mg/dL (ref 2.8–30.0)

## 2015-08-16 LAB — ACETAMINOPHEN LEVEL: Acetaminophen (Tylenol), Serum: <10 ug/mL — ABNORMAL LOW (ref 10–30)

## 2015-08-16 NOTE — ED Notes (Signed)
Pt voided upon arrival to room however was unaware that he needed to provide a urine specimen, pt notified of need for UA and provided w/ specimen cup.

## 2015-08-16 NOTE — ED Notes (Addendum)
Pt from home for eval of SI that started today, denies any specific plan at this time, denies any new stressors in life. Denies any hallucinations, auditory or visual. Denies any medical complaints just reports headache. nad noted, calm and cooperative. States uses cocaine but last used 4 days ago.

## 2015-08-16 NOTE — ED Notes (Signed)
Pt placed in scrubs, sitter en route and security paged to wand pt.

## 2015-08-16 NOTE — ED Notes (Signed)
Patient is resting comfortably. 

## 2015-08-16 NOTE — Telephone Encounter (Signed)
Pt called he would like to move his appt up to next because he is not feeling well he believes his fluid is up and he is trying to get some help with his substance abuse problem and he cant get in the program until he is checked by doctor here.. Please advise

## 2015-08-16 NOTE — ED Provider Notes (Signed)
CSN: [id number]     Arrival date & time 08/16/15  1926 History   First MD Initiated Contact with Patient 08/16/15 2201     Chief Complaint  Patient presents with  . Suicidal     (Consider location/radiation/quality/duration/timing/severity/associated sxs/prior Treatment) HPI Patient presents to the emergency department with suicidal ideation but has gotten worse over the last few weeks.  Patient states that he has had suicidal thoughts in the past, but never has attempted suicide.  The patient states that he does have a history of depression.  Patient denies nausea, vomiting, weakness, dizziness, headache, blurred vision, back pain, neck pain, fever, cough, or syncope.  The patient states that nothing seems make his condition, better or worse Past Medical History  Diagnosis Date  . Chronic systolic heart failure (HCC)     a) Mixed ICM/NICM b) RHC (05/2014): RA 2, RV 19/2/3, PA 22/14 (18), PCWP 6, Fick CO/CI: 5.2 / 2.7, PVR 2.3 WU, PA 60% and 64% c) ECHO (05/2014): EF 20-25%, diff HK, akinesis entireanteroseptal myocardium, triv AI, mod MR, LA mod/sev dilated  . Automatic implantable cardioverter-defibrillator in situ   . Heart murmur   . Sleep apnea     "cleared after T&A"  . 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[place])   . Ischemic cardiomyopathy     a) Coronary angiography (12/2008) at [facility]: Lmain: nl, LAD mid 100% stenosis with left to left and right-to-left collaterals to the distal LAD; Lcx: nl, RCA nl.    . Type II diabetes mellitus ([place])   . High cholesterol   . Pneumonia 1988  . Drug abuse and dependence ([place])   . CHF (congestive heart failure) [facility])    Past Surgical History  Procedure Laterality Date  . Forearm fracture surgery Left 1980  . Cardiac defibrillator placement  03/2011  . Cholecystectomy  1994  . Right heart catheterization N/A 05/30/2014    Procedure: RIGHT HEART CATH;  Surgeon: Jolaine Artist, MD;  Location[facility] LAB;  Service: Cardiovascular;  Laterality: N/A;   . Fracture surgery    . Cardiac catheterization  05/2014  . Tonsillectomy and adenoidectomy  ~ 1998  . Right heart catheterization N/A 02/07/2015    Procedure: RIGHT HEART CATH;  Surgeon: Jolaine Artist, MD;  Location: [facility] C[facility] LAB;  Service: Cardiovascular;  Laterality: N/A;   Family History  Problem Relation Age of Onset  . Diabetes Mother   . Heart disease Mother   . Diabetes Father   . Prostate cancer Father   . Heart disease Father   . Diabetes Brother   . Diabetes Brother    Social History  Substance Use Topics  . Smoking status: Current Some Day Smoker -- 0.10 packs/day for 31 years    Types: Cigarettes  . Smokeless tobacco: Never Used  . Alcohol Use: 0.0 oz/week    0 Standard drinks or equivalent per week     Comment: 09/21/2014 "might have a drink q 6 /months, if that"    Review of Systems All other systems negative except as documented in the HPI. All pertinent positives and negatives as reviewed in the HPI.   Allergies  Review of patient's allergies indicates no known allergies.  Home Medications   Prior to Admission medications   Medication Sig Start Date End Date Taking? Authorizing Provider  atorvastatin (LIPITOR) 20 MG tablet Take 1 tablet (20 mg total) by mouth daily at 6 PM. 02/07/15  Yes Florencia Reasons, MD  digoxin (LANOXIN) 0.25 MG tablet Take 1 tablet (  0.25 mg total) by mouth daily. 08/10/15  Yes Jolaine Artist, MD  furosemide (LASIX) 40 MG tablet Take 40 mg by mouth every other day.   Yes Historical Provider, MD  Insulin Glargine (LANTUS) 100 UNIT/ML Solostar Pen Inject 30 Units into the skin daily at 10 pm. Dispense pen and needles 02/14/15  Yes Ivan Anchors Love, PA-C  losartan (COZAAR) 25 MG tablet Take 0.5 tablets (12.5 mg total) by mouth daily. 02/28/15  Yes Jolaine Artist, MD  spironolactone (ALDACTONE) 25 MG tablet Take 0.5 tablets (12.5 mg total) by mouth daily. 02/13/15  Yes Ivan Anchors Love, PA-C  warfarin (COUMADIN) 7.5 MG tablet Take as directed by  coumadin clinic Patient taking differently: Take 7.5 mg by mouth daily at 6 PM.  03/19/15  Yes Jolaine Artist, MD  furosemide (LASIX) 40 MG tablet TAKE 1 TABLET BY MOUTH DAILY Patient not taking: Reported on 08/16/2015 08/08/15   Jolaine Artist, MD  glucose blood (ONETOUCH VERIO) test strip Use as instructed 11/30/14   Golden Circle, FNP  hydrocerin (EUCERIN) CREA Apply 1 application topically 2 (two) times daily. Patient not taking: Reported on 08/16/2015 02/13/15   Ivan Anchors Love, PA-C  insulin aspart (NOVOLOG FLEXPEN) 100 UNIT/ML FlexPen Use as directed. Patient not taking: Reported on 07/16/2015 02/14/15   Bary Leriche, PA-C  QC PEN NEEDLES 31G X 6 MM MISC 1 each by Other route See admin instructions. Use daily with insulin. 08/06/12   Historical Provider, MD  warfarin (COUMADIN) 2 MG tablet TAKE 3 & 1/2 TABLETS BY MOUTH DAILY AT 6PM Patient not taking: Reported on 08/16/2015 08/10/15   Jolaine Artist, MD   BP 113/74 mmHg  Pulse 83  Temp(Src) 98.2 F (36.8 C) (Oral)  Resp 16  Ht 6' (1.829 m)  Wt 150 lb (68.04 kg)  BMI 20.34 kg/m2  SpO2 97% Physical Exam  Constitutional: He is oriented to person, place, and time. He appears well-developed and well-nourished. No distress.  HENT:  Head: Normocephalic and atraumatic.  Mouth/Throat: Oropharynx is clear and moist.  Eyes: Pupils are equal, round, and reactive to light.  Neck: Normal range of motion. Neck supple.  Cardiovascular: Normal rate, regular rhythm and normal heart sounds.  Exam reveals no gallop and no friction rub.   No murmur heard. Pulmonary/Chest: Effort normal and breath sounds normal. No respiratory distress. He has no wheezes.  Abdominal: Soft. Bowel sounds are normal. He exhibits no distension. There is no tenderness.  Neurological: He is alert and oriented to person, place, and time. He exhibits normal muscle tone. Coordination normal.  Skin: Skin is warm and dry. No rash noted. No erythema.  Psychiatric: He has  a normal mood and affect. His speech is normal and behavior is normal. He expresses suicidal ideation. He expresses no homicidal ideation. He expresses no suicidal plans and no homicidal plans.  Nursing note and vitals reviewed.   ED Course  Procedures (including critical care time) Labs Review Labs Reviewed  COMPREHENSIVE METABOLIC PANEL - Abnormal; Notable for the following:    Chloride 97 (*)    Glucose, Bld 369 (*)    Creatinine, Ser 1.29 (*)    ALT 14 (*)    All other components within normal limits  ACETAMINOPHEN LEVEL - Abnormal; Notable for the following:    Acetaminophen (Tylenol), Serum <10 (*)    All other components within normal limits  CBC - Abnormal; Notable for the following:    WBC 12.2 (*)  All other components within normal limits  ETHANOL  SALICYLATE LEVEL  URINE RAPID DRUG SCREEN, HOSP PERFORMED    Imaging Review No results found. I have personally reviewed and evaluated these images and lab results as part of my medical decision-making.  Patient will need TTS assessment.  Patient is advised plan and all questions were answered  Dalia Heading, PA-C 08/17/15 0040   Medical screening examination/treatment/procedure(s) were performed by non-physician practitioner and as supervising physician I was immediately available for consultation/collaboration.   EKG Interpretation None       Nat Christen, MD 08/17/15 [phone]

## 2015-08-17 ENCOUNTER — Inpatient Hospital Stay (HOSPITAL_COMMUNITY)
Admission: AD | Admit: 2015-08-17 | Discharge: 2015-08-23 | DRG: 885 | Disposition: A | Discharge status: Home / Self Care | Payer: 59 | Source: Intra-hospital | Attending: Psychiatry | Admitting: Psychiatry

## 2015-08-17 ENCOUNTER — Encounter (HOSPITAL_COMMUNITY): Payer: Self-pay

## 2015-08-17 DIAGNOSIS — F1994 Other psychoactive substance use, unspecified with psychoactive substance-induced mood disorder: Secondary | ICD-10-CM

## 2015-08-17 DIAGNOSIS — Z8673 Personal history of transient ischemic attack (TIA), and cerebral infarction without residual deficits: Secondary | ICD-10-CM

## 2015-08-17 DIAGNOSIS — F332 Major depressive disorder, recurrent severe without psychotic features: Secondary | ICD-10-CM | POA: Diagnosis present

## 2015-08-17 DIAGNOSIS — F1424 Cocaine dependence with cocaine-induced mood disorder: Secondary | ICD-10-CM | POA: Diagnosis present

## 2015-08-17 DIAGNOSIS — Z7901 Long term (current) use of anticoagulants: Secondary | ICD-10-CM | POA: Diagnosis not present

## 2015-08-17 DIAGNOSIS — F17210 Nicotine dependence, cigarettes, uncomplicated: Secondary | ICD-10-CM | POA: Diagnosis present

## 2015-08-17 DIAGNOSIS — Z9114 Patient's other noncompliance with medication regimen: Secondary | ICD-10-CM | POA: Diagnosis not present

## 2015-08-17 DIAGNOSIS — F1014 Alcohol abuse with alcohol-induced mood disorder: Secondary | ICD-10-CM | POA: Diagnosis present

## 2015-08-17 DIAGNOSIS — E1139 Type 2 diabetes mellitus with other diabetic ophthalmic complication: Secondary | ICD-10-CM | POA: Diagnosis present

## 2015-08-17 DIAGNOSIS — Z833 Family history of diabetes mellitus: Secondary | ICD-10-CM

## 2015-08-17 DIAGNOSIS — F419 Anxiety disorder, unspecified: Secondary | ICD-10-CM | POA: Diagnosis present

## 2015-08-17 DIAGNOSIS — E118 Type 2 diabetes mellitus with unspecified complications: Secondary | ICD-10-CM

## 2015-08-17 DIAGNOSIS — I10 Essential (primary) hypertension: Secondary | ICD-10-CM | POA: Diagnosis present

## 2015-08-17 DIAGNOSIS — F1414 Cocaine abuse with cocaine-induced mood disorder: Secondary | ICD-10-CM | POA: Diagnosis present

## 2015-08-17 DIAGNOSIS — E785 Hyperlipidemia, unspecified: Secondary | ICD-10-CM | POA: Diagnosis present

## 2015-08-17 DIAGNOSIS — Z8249 Family history of ischemic heart disease and other diseases of the circulatory system: Secondary | ICD-10-CM | POA: Diagnosis not present

## 2015-08-17 DIAGNOSIS — G4700 Insomnia, unspecified: Secondary | ICD-10-CM | POA: Diagnosis present

## 2015-08-17 DIAGNOSIS — E7800 Pure hypercholesterolemia, unspecified: Secondary | ICD-10-CM | POA: Diagnosis present

## 2015-08-17 DIAGNOSIS — Z95810 Presence of automatic (implantable) cardiac defibrillator: Secondary | ICD-10-CM | POA: Diagnosis not present

## 2015-08-17 DIAGNOSIS — E1165 Type 2 diabetes mellitus with hyperglycemia: Secondary | ICD-10-CM | POA: Diagnosis present

## 2015-08-17 DIAGNOSIS — Z8042 Family history of malignant neoplasm of prostate: Secondary | ICD-10-CM | POA: Diagnosis not present

## 2015-08-17 DIAGNOSIS — Z794 Long term (current) use of insulin: Secondary | ICD-10-CM

## 2015-08-17 DIAGNOSIS — R45851 Suicidal ideations: Secondary | ICD-10-CM | POA: Diagnosis present

## 2015-08-17 HISTORY — DX: Other psychoactive substance use, unspecified with psychoactive substance-induced mood disorder: F19.94

## 2015-08-17 LAB — PROTIME-INR
INR: 4.19 — ABNORMAL HIGH (ref 0.00–1.49)
INR: 3.91 — ABNORMAL HIGH (ref 0.00–1.49)
Prothrombin Time: 39.3 seconds — ABNORMAL HIGH (ref 11.6–15.2)
Prothrombin Time: 37.3 seconds — ABNORMAL HIGH (ref 11.6–15.2)

## 2015-08-17 LAB — RAPID URINE DRUG SCREEN, HOSP PERFORMED
Amphetamines: NOT DETECTED
Barbiturates: NOT DETECTED
Benzodiazepines: NOT DETECTED
Cocaine: POSITIVE — AB
Opiates: NOT DETECTED
Tetrahydrocannabinol: NOT DETECTED

## 2015-08-17 LAB — CBG MONITORING, ED
Glucose-Capillary: 397 mg/dL — ABNORMAL HIGH (ref 65–99)
Glucose-Capillary: 370 mg/dL — ABNORMAL HIGH (ref 65–99)
Glucose-Capillary: 196 mg/dL — ABNORMAL HIGH (ref 65–99)
Glucose-Capillary: 154 mg/dL — ABNORMAL HIGH (ref 65–99)
Glucose-Capillary: 261 mg/dL — ABNORMAL HIGH (ref 65–99)

## 2015-08-17 LAB — GLUCOSE, CAPILLARY: Glucose-Capillary: 365 mg/dL — ABNORMAL HIGH (ref 65–99)

## 2015-08-17 MED ORDER — INSULIN ASPART 100 UNIT/ML ~~LOC~~ SOLN: 0.0000 [IU] | Freq: Three times a day (TID) | SUBCUTANEOUS | Status: DC

## 2015-08-17 MED ORDER — ACETAMINOPHEN 325 MG PO TABS: 650.0000 mg | ORAL_TABLET | Freq: Four times a day (QID) | ORAL | Status: DC | PRN

## 2015-08-17 MED ORDER — INSULIN ASPART 100 UNIT/ML ~~LOC~~ SOLN
0.0000 [IU] | Freq: Every day | SUBCUTANEOUS | Status: DC
  Administered 2015-08-17: 5 [IU] via SUBCUTANEOUS

## 2015-08-17 MED ORDER — ALUM & MAG HYDROXIDE-SIMETH 200-200-20 MG/5ML PO SUSP: 30.0000 mL | ORAL | Status: DC | PRN

## 2015-08-17 MED ORDER — INSULIN GLARGINE 100 UNIT/ML ~~LOC~~ SOLN
30.0000 [IU] | Freq: Every day | SUBCUTANEOUS | Status: DC
  Administered 2015-08-17: 30 [IU] via SUBCUTANEOUS
  Filled 2015-08-17 (×2): qty 0.3

## 2015-08-17 MED ORDER — ZOLPIDEM TARTRATE 5 MG PO TABS: 5.0000 mg | ORAL_TABLET | Freq: Every evening | ORAL | Status: DC | PRN

## 2015-08-17 MED ORDER — ONDANSETRON HCL 4 MG PO TABS: 4.0000 mg | ORAL_TABLET | Freq: Three times a day (TID) | ORAL | Status: DC | PRN

## 2015-08-17 MED ORDER — INSULIN ASPART 100 UNIT/ML ~~LOC~~ SOLN
10.0000 [IU] | Freq: Once | SUBCUTANEOUS | Status: AC
  Administered 2015-08-17: 10 [IU] via SUBCUTANEOUS
  Filled 2015-08-17: qty 1

## 2015-08-17 MED ORDER — SPIRONOLACTONE 12.5 MG HALF TABLET
12.5000 mg | ORAL_TABLET | Freq: Every day | ORAL | Status: DC
  Administered 2015-08-18 – 2015-08-23 (×6): 12.5 mg via ORAL
  Filled 2015-08-17 (×8): qty 1

## 2015-08-17 MED ORDER — LOSARTAN POTASSIUM 25 MG PO TABS
12.5000 mg | ORAL_TABLET | Freq: Every day | ORAL | Status: DC
  Administered 2015-08-17: 12.5 mg via ORAL
  Filled 2015-08-17: qty 0.5

## 2015-08-17 MED ORDER — ATORVASTATIN CALCIUM 20 MG PO TABS
20.0000 mg | ORAL_TABLET | Freq: Every day | ORAL | Status: DC
  Administered 2015-08-18 – 2015-08-22 (×5): 20 mg via ORAL
  Filled 2015-08-17 (×7): qty 1

## 2015-08-17 MED ORDER — ATORVASTATIN CALCIUM 20 MG PO TABS
20.0000 mg | ORAL_TABLET | Freq: Every day | ORAL | Status: DC
  Administered 2015-08-17: 20 mg via ORAL
  Filled 2015-08-17: qty 1

## 2015-08-17 MED ORDER — INFLUENZA VAC SPLIT QUAD 0.5 ML IM SUSY
0.5000 mL | PREFILLED_SYRINGE | INTRAMUSCULAR | Status: AC
  Administered 2015-08-18: 0.5 mL via INTRAMUSCULAR
  Filled 2015-08-17: qty 0.5

## 2015-08-17 MED ORDER — SPIRONOLACTONE 12.5 MG HALF TABLET
12.5000 mg | ORAL_TABLET | Freq: Every day | ORAL | Status: DC
  Administered 2015-08-17: 12.5 mg via ORAL
  Filled 2015-08-17: qty 1

## 2015-08-17 MED ORDER — FUROSEMIDE 20 MG PO TABS
40.0000 mg | ORAL_TABLET | ORAL | Status: DC
  Administered 2015-08-17: 40 mg via ORAL
  Filled 2015-08-17: qty 2

## 2015-08-17 MED ORDER — INSULIN GLARGINE 100 UNIT/ML ~~LOC~~ SOLN
30.0000 [IU] | Freq: Every day | SUBCUTANEOUS | Status: DC
  Administered 2015-08-17 – 2015-08-22 (×6): 30 [IU] via SUBCUTANEOUS

## 2015-08-17 MED ORDER — DIGOXIN 125 MCG PO TABS
0.2500 mg | ORAL_TABLET | Freq: Every day | ORAL | Status: DC
  Administered 2015-08-17: 0.25 mg via ORAL
  Filled 2015-08-17: qty 2

## 2015-08-17 MED ORDER — PNEUMOCOCCAL VAC POLYVALENT 25 MCG/0.5ML IJ INJ
0.5000 mL | INJECTION | INTRAMUSCULAR | Status: AC
  Administered 2015-08-18: 0.5 mL via INTRAMUSCULAR

## 2015-08-17 MED ORDER — DIGOXIN 250 MCG PO TABS
0.2500 mg | ORAL_TABLET | Freq: Every day | ORAL | Status: DC
  Administered 2015-08-18 – 2015-08-23 (×6): 0.25 mg via ORAL
  Filled 2015-08-17 (×8): qty 1

## 2015-08-17 MED ORDER — LOSARTAN POTASSIUM 25 MG PO TABS
12.5000 mg | ORAL_TABLET | Freq: Every day | ORAL | Status: DC
  Administered 2015-08-18 – 2015-08-23 (×7): 12.5 mg via ORAL
  Filled 2015-08-17 (×8): qty 0.5

## 2015-08-17 MED ORDER — ACETAMINOPHEN 325 MG PO TABS: 650.0000 mg | ORAL_TABLET | ORAL | Status: DC | PRN

## 2015-08-17 MED ORDER — WARFARIN SODIUM 7.5 MG PO TABS: 7.5000 mg | ORAL_TABLET | Freq: Every day | ORAL | Status: DC

## 2015-08-17 MED ORDER — FUROSEMIDE 40 MG PO TABS
40.0000 mg | ORAL_TABLET | ORAL | Status: DC
  Administered 2015-08-18 – 2015-08-22 (×3): 40 mg via ORAL
  Filled 2015-08-17 (×2): qty 1
  Filled 2015-08-17: qty 2
  Filled 2015-08-17 (×2): qty 1

## 2015-08-17 MED ORDER — INSULIN ASPART 100 UNIT/ML ~~LOC~~ SOLN
0.0000 [IU] | Freq: Three times a day (TID) | SUBCUTANEOUS | Status: DC
  Administered 2015-08-18 (×2): 5 [IU] via SUBCUTANEOUS
  Administered 2015-08-19: 1 [IU] via SUBCUTANEOUS
  Administered 2015-08-19: 3 [IU] via SUBCUTANEOUS
  Administered 2015-08-19: 9 [IU] via SUBCUTANEOUS
  Administered 2015-08-20: 3 [IU] via SUBCUTANEOUS
  Administered 2015-08-20: 5 [IU] via SUBCUTANEOUS
  Administered 2015-08-21: 2 [IU] via SUBCUTANEOUS
  Administered 2015-08-21 – 2015-08-22 (×2): 3 [IU] via SUBCUTANEOUS
  Administered 2015-08-22: 2 [IU] via SUBCUTANEOUS
  Administered 2015-08-22 – 2015-08-23 (×2): 7 [IU] via SUBCUTANEOUS
  Administered 2015-08-23: 5 [IU] via SUBCUTANEOUS

## 2015-08-17 MED ORDER — WARFARIN SODIUM 5 MG PO TABS: 5.0000 mg | ORAL_TABLET | Freq: Every day | ORAL | Status: DC

## 2015-08-17 MED ORDER — MAGNESIUM HYDROXIDE 400 MG/5ML PO SUSP: 30.0000 mL | Freq: Every day | ORAL | Status: DC | PRN

## 2015-08-17 MED ORDER — IBUPROFEN 200 MG PO TABS: 600.0000 mg | ORAL_TABLET | Freq: Three times a day (TID) | ORAL | Status: DC | PRN
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
## 2015-08-17 NOTE — ED Notes (Signed)
Snack and drink given to patient. Regular diet order taken for dinner.

## 2015-08-17 NOTE — ED Notes (Signed)
Attempted report to behavorial health. Pt can be transferred when CBG is less than 350.

## 2015-08-17 NOTE — ED Notes (Signed)
Patient cbg was 154, Nurse was informed.

## 2015-08-17 NOTE — ED Notes (Signed)
Telepsych monitor placed at bedside.

## 2015-08-17 NOTE — ED Notes (Signed)
Per [facility] pts CBG has to be under 350 for 24hrs and medically cleared for his heart history before they will accept the pt.

## 2015-08-17 NOTE — BHH Counselor (Signed)
Per [name], NP, Pt meets inpt tx criteria. Per [name], [facility], Pt accepted to [facility] Bed 307-2 under the care of Dr Sabra Heck. Pt can come over asap.  Unable to reach EDP, so counselor informed RN on Pod C of disposition. RN says she will share info with EDP.   Ramond Dial, [facility] Therapeutic Triage

## 2015-08-17 NOTE — ED Notes (Addendum)
Spoke with RN at [facility] about patient's CBG levels. He stated that it is protocol for an individual who had a CBG > 350 mg/dl be held at the hospital until they have had at least 24 hours of CBGs under 350 mg/dl. Will continue to assess CBGs and proceed from there.

## 2015-08-17 NOTE — ED Notes (Signed)
Pt ambulated to the bathroom with the sitter

## 2015-08-17 NOTE — Progress Notes (Signed)
Patient accepted at [facility], to 307-2, to Dr. Sabra Heck, arrival time - after 20:30. RN [place] informed.  Verlon Setting, [place] Disposition staff 08/17/2015 6:43 PM

## 2015-08-17 NOTE — ED Notes (Signed)
Patient was given a snack and drink, Regular diet order taken.

## 2015-08-17 NOTE — ED Notes (Signed)
Pt has been accepted at [facility]. Pt can go to room 307-2. Accepting MD is [place].

## 2015-08-18 DIAGNOSIS — F332 Major depressive disorder, recurrent severe without psychotic features: Secondary | ICD-10-CM | POA: Diagnosis present

## 2015-08-18 DIAGNOSIS — F1014 Alcohol abuse with alcohol-induced mood disorder: Secondary | ICD-10-CM

## 2015-08-18 DIAGNOSIS — R45851 Suicidal ideations: Secondary | ICD-10-CM

## 2015-08-18 HISTORY — DX: Major depressive disorder, recurrent severe without psychotic features: F33.2

## 2015-08-18 HISTORY — DX: Alcohol abuse with alcohol-induced mood disorder: F10.14

## 2015-08-18 LAB — PROTIME-INR
INR: 1.53 — ABNORMAL HIGH (ref 0.00–1.49)
Prothrombin Time: 18.5 seconds — ABNORMAL HIGH (ref 11.6–15.2)

## 2015-08-18 LAB — GLUCOSE, CAPILLARY
Glucose-Capillary: 95 mg/dL (ref 65–99)
Glucose-Capillary: 99 mg/dL (ref 65–99)
Glucose-Capillary: 262 mg/dL — ABNORMAL HIGH (ref 65–99)
Glucose-Capillary: 288 mg/dL — ABNORMAL HIGH (ref 65–99)

## 2015-08-18 MED ORDER — VITAMIN B-1 100 MG PO TABS
100.0000 mg | ORAL_TABLET | Freq: Every day | ORAL | Status: DC
  Administered 2015-08-19 – 2015-08-23 (×5): 100 mg via ORAL
  Filled 2015-08-18 (×7): qty 1

## 2015-08-18 MED ORDER — WARFARIN - PHARMACIST DOSING INPATIENT
Freq: Every day | Status: DC
  Administered 2015-08-19 – 2015-08-22 (×2)
  Filled 2015-08-18 (×15): qty 1

## 2015-08-18 MED ORDER — LORAZEPAM 1 MG PO TABS
1.0000 mg | ORAL_TABLET | Freq: Four times a day (QID) | ORAL | Status: AC | PRN
  Administered 2015-08-19 (×2): 1 mg via ORAL
  Filled 2015-08-18 (×3): qty 1

## 2015-08-18 MED ORDER — ONDANSETRON 4 MG PO TBDP: 4.0000 mg | ORAL_TABLET | Freq: Four times a day (QID) | ORAL | Status: AC | PRN

## 2015-08-18 MED ORDER — ADULT MULTIVITAMIN W/MINERALS CH
1.0000 | ORAL_TABLET | Freq: Every day | ORAL | Status: DC
Start: 2015-08-18 — End: 2015-08-23
  Administered 2015-08-18 – 2015-08-23 (×6): 1 via ORAL
  Filled 2015-08-18 (×9): qty 1

## 2015-08-18 MED ORDER — LOPERAMIDE HCL 2 MG PO CAPS: 2.0000 mg | ORAL_CAPSULE | ORAL | Status: AC | PRN

## 2015-08-18 MED ORDER — METFORMIN HCL ER 500 MG PO TB24
500.0000 mg | ORAL_TABLET | Freq: Two times a day (BID) | ORAL | Status: DC
  Filled 2015-08-18 (×2): qty 1

## 2015-08-18 MED ORDER — HYDROXYZINE HCL 25 MG PO TABS
25.0000 mg | ORAL_TABLET | Freq: Four times a day (QID) | ORAL | Status: AC | PRN
  Administered 2015-08-19 (×2): 25 mg via ORAL
  Filled 2015-08-18 (×3): qty 1

## 2015-08-18 MED ORDER — PAROXETINE HCL 20 MG PO TABS
20.0000 mg | ORAL_TABLET | Freq: Every day | ORAL | Status: DC
  Filled 2015-08-18: qty 1

## 2015-08-18 MED ORDER — PAROXETINE HCL 20 MG PO TABS
20.0000 mg | ORAL_TABLET | Freq: Every day | ORAL | Status: DC
  Administered 2015-08-18 – 2015-08-22 (×5): 20 mg via ORAL
  Filled 2015-08-18 (×9): qty 1

## 2015-08-18 MED ORDER — WARFARIN SODIUM 7.5 MG PO TABS
7.5000 mg | ORAL_TABLET | Freq: Once | ORAL | Status: AC
  Administered 2015-08-18: 7.5 mg via ORAL
  Filled 2015-08-18: qty 3
  Filled 2015-08-18: qty 1

## 2015-08-18 MED ORDER — THIAMINE HCL 100 MG/ML IJ SOLN
100.0000 mg | Freq: Once | INTRAMUSCULAR | Status: AC
  Administered 2015-08-18: 100 mg via INTRAMUSCULAR
  Filled 2015-08-18: qty 2

## 2015-08-18 NOTE — BHH Group Notes (Signed)
[facility] Group Notes: (Clinical Social Work)   08/18/2015      Type of Therapy:  Group Therapy   Participation Level:  Did Not Attend despite MHT prompting   Selmer Dominion, LCSW 08/18/2015, 12:45 PM

## 2015-08-18 NOTE — BHH Suicide Risk Assessment (Signed)
[facility] Admission Suicide Risk Assessment   Nursing information obtained from:  Patient Demographic factors:  Male, Living alone, Unemployed, , living alone, unmarried and no children Current Mental Status:  Suicidal ideation indicated by others depressed Loss Factors:  Decline in physical health, Financial problems / change in socioeconomic status Historical Factors:  NA denies previous SA and family hx of mental illness and SA Risk Reduction Factors:  NA Total Time spent with patient: 30 minutes Principal Problem: <principal problem not specified> Diagnosis:   Patient Active Problem List   Diagnosis Date Noted  . Substance induced mood disorder ([place]) [F19.94] 08/17/2015  . Psychoactive substance-induced mood disorder ([place]) [E31.54, F06.30] 07/17/2015  . Open wound of foot [S91.309A] 02/27/2015  . Acute CVA (cerebrovascular accident) ([place]) [I63.9] 02/07/2015  . HLD (hyperlipidemia) [E78.5]   . Status post PICC central line placement [Z95.828]   . History of ischemic right MCA stroke [Z86.73]   . Type 2 diabetes mellitus with complication (HCC) [M08.6]   . Acute on chronic systolic congestive heart failure ([place]) [I50.23]   . Stroke [facility]) [I63.9]   . Stroke with cerebral ischemia ([place]) [I63.9]   . CVA (cerebral infarction) [I63.9] 01/26/2015  . Left-sided weakness [M62.89]   . Embolic stroke involving right middle cerebral artery ([place]) [I63.411]   . Cardiac LV ejection fraction 10-20% [R94.30]   . 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[place]) [I42.8]   . Acute systolic heart failure ([place]) [I50.21] 01/24/2015  . Acute ischemic stroke ([place]) [I63.9] 01/24/2015  . Cocaine abuse [F14.10] 01/24/2015  . H/O noncompliance with medical treatment, presenting hazards to health [Z91.19] 01/24/2015  . Tobacco abuse [Z72.0] 01/24/2015  . Essential hypertension [I10] 01/24/2015  . Hypotension [I95.9] 01/24/2015  . Hyperlipidemia [E78.5] 01/24/2015  . Dysphagia following cerebral infarction [I69.391]  01/24/2015  . Hemiplegia affecting left nondominant side ([place]) [G81.94] 01/24/2015  . Diabetes mellitus type 2, uncontrolled, with complications ([place]) [P61.9, E11.65] 01/24/2015  . Hyperlipidemia associated with type 2 diabetes mellitus ([place]) [E11.69, E78.5] 11/30/2014  . CAD (coronary artery disease) [I25.10] 11/16/2014  . Acute on chronic systolic CHF (congestive heart failure) ([place]) [I50.23] 09/21/2014  . Chest pain on breathing [R07.1] 09/21/2014  . Chronic systolic heart failure ([place]) [I50.22] 06/20/2014  . Acute on chronic systolic heart failure ([place]) [I50.23]   . Automatic implantable cardioverter-defibrillator in situ [Z95.810]   . 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 Dyspnea [R06.00] 05/26/2014  . Chest discomfort [R07.89] 08/09/2013  . Long term (current) use of anticoagulants [Z79.01] 08/09/2013  . Drug abuse and dependence ([place]) [F19.20]   . CHF (congestive heart failure) ([place]) [I50.9]   . BP (high blood pressure) [I10] 03/04/2013  . Cardiomyopathy, ischemic [I25.5] 07/21/2011  . Type II diabetes mellitus with ophthalmic manifestations ([place]) [E11.39] 09/13/2007  . TOBACCO ABUSE [F17.200] 09/13/2007  . WEIGHT LOSS, ABNORMAL [R63.4] 09/13/2007  . SOB [R06.02] 09/13/2007     Continued Clinical Symptoms:  Alcohol Use Disorder Identification Test Final Score (AUDIT): 32 The "Alcohol Use Disorders Identification Test", Guidelines for Use in Primary Care, Second Edition.  World Pharmacologist [facility]). Score between 0-7:  no or low risk or alcohol related problems. Score between 8-15:  moderate risk of alcohol related problems. Score between 16-19:  high risk of alcohol related problems. Score 20 or above:  warrants further diagnostic evaluation for alcohol dependence and treatment.   CLINICAL FACTORS:   Depression:   Anhedonia Comorbid alcohol abuse/dependence Hopelessness Severe  Pt states he has been depressed for several months and it is  getting worse. Reports anhedonia, worthlessness and hopelessness. Anxiety is high and pt is not sleeping. States he doesn't want a sleep aid. Endorsing SI without plan or intent. Denies HI. Denies AVH. States he has reoccurring thought to "end it". Pt has been drinking alcohol - 12 pack almost daily. Reports last drink was 5 days ago and denies withdrawal symptoms. He was using cocaine daily. Denies other drug use.   Reports he has never been on antidepressants and has never been hospitalized at an inpt psych facility.    Musculoskeletal: Strength & Muscle Tone: within normal limits Gait & Station: normal Patient leans: N/A  Psychiatric Specialty Exam: Physical Exam  Review of Systems  Constitutional: Negative for fever and chills.  HENT: Negative for sore throat.   Eyes: Negative for blurred vision.  Respiratory: Negative for cough.   Cardiovascular: Negative for chest pain.  Gastrointestinal: Negative for nausea, vomiting and abdominal pain.  Musculoskeletal: Negative for back pain, joint pain and neck pain.  Skin: Negative for itching and rash.  Neurological: Negative for dizziness, tremors, seizures, loss of consciousness and headaches.  Psychiatric/Behavioral: Positive for depression, suicidal ideas and substance abuse. Negative for hallucinations. The patient is nervous/anxious and has insomnia.     Blood pressure 94/68, pulse 82, temperature 98 F (36.7 C), temperature source Oral, resp. rate 16, height '5\' 10"'$  (1.778 m), weight 64.864 kg (143 lb).Body mass index is 20.52 kg/(m^2).  General Appearance: Casual  Eye Contact::  Good  Speech:  Clear and Coherent and Normal Rate  Volume:  Normal  Mood:  Depressed  Affect:  Congruent  Thought Process:  Goal Directed  Orientation:  Full (Time, Place, and Person)  Thought Content:  Negative  Suicidal Thoughts:  Yes.  without intent/plan  Homicidal Thoughts:  No  Memory:  Immediate;   Good Recent;   Good Remote;   Good   Judgement:  Good  Insight:  Good  Psychomotor Activity:  Normal  Concentration:  Good  Recall:  Good  Fund of Knowledge:Good  Language: Good  Akathisia:  No  Handed:  Right  AIMS (if indicated):     Assets:  Communication Skills Desire for Improvement  Sleep:  Number of Hours: 6.75  Cognition: WNL  ADL's:  Intact     COGNITIVE FEATURES THAT CONTRIBUTE TO RISK:  Closed-mindedness and Thought constriction (tunnel vision)    SUICIDE RISK:   Severe:  Frequent, intense, and enduring suicidal ideation, specific plan, no subjective intent, but some objective markers of intent (i.e., choice of lethal method), the method is accessible, some limited preparatory behavior, evidence of impaired self-control, severe dysphoria/symptomatology, multiple risk factors present, and few if any protective factors, particularly a lack of social support.  PLAN OF CARE: daily contact and medication management  Ativan protocol for alcohol withdrawal Insulin Sliding scale for DM Labs- glu 369, ALT 14; UDS + cocaine Start trial of Paxil '20mg'$  po qD  Medical Decision Making:  Review of Psycho-Social Stressors (1), Review or order clinical lab tests (1), Established Problem, Worsening (2) and Review of Medication Regimen & Side Effects (2)  I certify that inpatient services furnished can reasonably be expected to improve the patient's condition.   Charlcie Cradle 08/18/2015, 11:57 AM

## 2015-08-18 NOTE — Progress Notes (Signed)
Pharmacy: Re- warfarin  Patient's a 54 y.o M on warfarin PTA for Left ventricular noncompaction.  INR now back sub-therapeutic at 1.53 (from 3.91 yesterday-- no reversal agent given)  Home warfarin regimen: 7.'5mg'$  daily   Plan: - warfarin 7.5 mg PO x1 tonight  Dia Sitter, PharmD, BCPS 08/18/2015 8:13 PM

## 2015-08-18 NOTE — Progress Notes (Signed)
D.  Pt pleasant on approach, denies complaints at this time.  Positive for evening AA group, interacting appropriately with peers on the unit.  Denies SI/HI/hallucinations at this time.  A.  Support and encouragement offered  R.  Pt remains safe on unit, will continue to monitor.

## 2015-08-18 NOTE — Plan of Care (Signed)
Problem: Diagnosis: Increased Risk For Suicide Attempt Goal: STG-Patient Will Report Suicidal Feelings to Staff Outcome: Completed/Met Date Met:  08/18/15 Pt reports intermittent SI to Probation officer. Pt verbally agrees to contact staff before acting on any harmful thoughts.

## 2015-08-18 NOTE — Progress Notes (Signed)
Pt currently presents with a flat affect and depressed behavior. Per self inventory, pt rates depression at a 9, hopelessness 9 and anxiety 8. Pt's daily goal is to "tired of feeling this way" and they intend to do so by "I don't know." Pt reports poor sleep, a good appetite, low energy and poor concentration.   Pt provided with medications per providers orders. Pt's labs and vitals were monitored throughout the day. Pt supported emotionally and encouraged to express concerns and questions. Pt educated on medications. Pt given pneumovax 23 and influenza vaccine.   Pt's safety ensured with 15 minute and environmental checks. Pt currently denies HI and A/V hallucinations. Pt verbally agrees to seek staff if SI/HI or A/VH occurs. Pt endorses current SI. Pt states that these thoughts have remained the same. Pt verbally agrees to consult with staff before acting on these thoughts or if thoughts increase/worsen. MHT notified to be watchful on the floor. Will continue POC.

## 2015-08-18 NOTE — Tx Team (Signed)
Initial Interdisciplinary Treatment Plan   PATIENT STRESSORS: Financial difficulties Health problems Marital or family conflict Substance abuse   PATIENT STRENGTHS: Capable of independent living Communication skills   PROBLEM LIST: Problem List/Patient Goals Date to be addressed Date deferred Reason deferred Estimated date of resolution  "get help with my depression" 08/17/15     "work on my substance abuse."  08/17/15     anxiety 08/17/15     depression 08/17/15     Substance Abuse 08/17/15     Rick for Suicide 08/17/15                        DISCHARGE CRITERIA:  Ability to meet basic life and health needs Safe-care adequate arrangements made Verbal commitment to aftercare and medication compliance  PRELIMINARY DISCHARGE PLAN: Return to previous living arrangement  PATIENT/FAMIILY INVOLVEMENT: This treatment plan has been presented to and reviewed with the patient, Bosco Paparella, and/or family member.  The patient and family have been given the opportunity to ask questions and make suggestions.  Adediran T Onagoruwa 08/18/2015, 3:47 AM

## 2015-08-18 NOTE — H&P (Signed)
Psychiatric Admission Assessment Adult  Patient Identification: Dennis [name] MRN:  [id number] Date of Evaluation:  08/18/2015 Chief Complaint:  "I'm feeling suicidal and I want to end it all" Principal Diagnosis: MDD (major depressive disorder), recurrent severe, without psychosis ([place]) Diagnosis:   Patient Active Problem List   Diagnosis Date Noted  . Alcohol abuse with alcohol-induced mood disorder ([place]) [F10.14] 08/18/2015    Priority: High  . MDD (major depressive disorder), recurrent severe, without psychosis ([place]) [F33.2] 08/18/2015    Priority: High  . Substance induced mood disorder ([place]) [F19.94] 08/17/2015    Priority: High  . Psychoactive substance-induced mood disorder ([place]) [U23.53, F06.30] 07/17/2015  . Open wound of foot [S91.309A] 02/27/2015  . Acute CVA (cerebrovascular accident) ([place]) [I63.9] 02/07/2015  . HLD (hyperlipidemia) [E78.5]   . Status post PICC central line placement [Z95.828]   . History of ischemic right MCA stroke [Z86.73]   . Type 2 diabetes mellitus with complication (HCC) [I14.4]   . Acute on chronic systolic congestive heart failure ([place]) [I50.23]   . Stroke [facility]) [I63.9]   . Stroke with cerebral ischemia ([place]) [I63.9]   . CVA (cerebral infarction) [I63.9] 01/26/2015  . Left-sided weakness [M62.89]   . Embolic stroke involving right middle cerebral artery ([place]) [I63.411]   . Cardiac LV ejection fraction 10-20% [R94.30]   . 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[place]) [I42.8]   . Acute systolic heart failure ([place]) [I50.21] 01/24/2015  . Acute ischemic stroke ([place]) [I63.9] 01/24/2015  . Cocaine abuse [F14.10] 01/24/2015  . H/O noncompliance with medical treatment, presenting hazards to health [Z91.19] 01/24/2015  . Tobacco abuse [Z72.0] 01/24/2015  . Essential hypertension [I10] 01/24/2015  . Hypotension [I95.9] 01/24/2015  . Hyperlipidemia [E78.5] 01/24/2015  . Dysphagia following cerebral infarction [I69.391] 01/24/2015  . Hemiplegia affecting left  nondominant side ([place]) [G81.94] 01/24/2015  . Diabetes mellitus type 2, uncontrolled, with complications ([place]) [R15.4, E11.65] 01/24/2015  . Hyperlipidemia associated with type 2 diabetes mellitus ([place]) [E11.69, E78.5] 11/30/2014  . CAD (coronary artery disease) [I25.10] 11/16/2014  . Acute on chronic systolic CHF (congestive heart failure) ([place]) [I50.23] 09/21/2014  . Chest pain on breathing [R07.1] 09/21/2014  . Chronic systolic heart failure ([place]) [I50.22] 06/20/2014  . Acute on chronic systolic heart failure ([place]) [I50.23]   . Automatic implantable cardioverter-defibrillator in situ [Z95.810]   . 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[place]) [I42.8] 05/27/2014  . Dyspnea [R06.00] 05/26/2014  . Chest discomfort [R07.89] 08/09/2013  . Long term (current) use of anticoagulants [Z79.01] 08/09/2013  . Drug abuse and dependence ([place]) [F19.20]   . CHF (congestive heart failure) ([place]) [I50.9]   . BP (high blood pressure) [I10] 03/04/2013  . Cardiomyopathy, ischemic [I25.5] 07/21/2011  . Type II diabetes mellitus with ophthalmic manifestations ([place]) [E11.39] 09/13/2007  . TOBACCO ABUSE [F17.200] 09/13/2007  . WEIGHT LOSS, ABNORMAL [R63.4] 09/13/2007  . SOB [R06.02] 09/13/2007   History of Present Illness:: Pt states he has been depressed for several months and it is getting worse. Reports anhedonia, worthlessness and hopelessness. Anxiety is high and pt is not sleeping. States he doesn't want a sleep aid. Endorsing Suicidal ideation and cannot contract for safety. Denies homicidal ideation and psychosis and did not appear to be responding to internal stimuli. States he has reoccurring thought to "end it". Pt has been drinking alcohol - 12 pack almost daily. Reports last drink was 5 days ago and denies withdrawal symptoms. He was using cocaine daily. Denies other drug use. Reports he has never been on antidepressants and has never been hospitalized at  an inpt psych facility.   On 08/18/15, pt seen and chart  reviewed for H&P. Pt is alert/oriented x4, calm, cooperative, and appropriate. Pt reports that he has been very depressed and that he has been using cocaine and alcohol chronically to alleviate his depressive symptoms. He reports that his chronic stressors are financial and that his "life isn't matching up with what was planned years ago". Pt denies current suicidal/homicidal ideation and psychosis and does not appear to be responding to internal stimuli. Cites good sleep and appetite. He is in agreement with current treatment plan below.   Associated Signs/Symptoms: Depression Symptoms:  depressed mood, anhedonia, insomnia, psychomotor agitation, psychomotor retardation, feelings of worthlessness/guilt, difficulty concentrating, hopelessness, recurrent thoughts of death, anxiety, (Hypo) Manic Symptoms:  Impulsivity, Irritable Mood, Labiality of Mood, Anxiety Symptoms:  Excessive Worry, Psychotic Symptoms:  Denies PTSD Symptoms: NA Total Time spent with patient: 45 minutes  Past Psychiatric History: Denies any hx of treatment  Risk to Self: Is patient at risk for suicide?: Yes Risk to Others:   Prior Inpatient Therapy:   Prior Outpatient Therapy:    Alcohol Screening: 1. How often do you have a drink containing alcohol?: 4 or more times a week 2. How many drinks containing alcohol do you have on a typical day when you are drinking?: 5 or 6 3. How often do you have six or more drinks on one occasion?: Weekly Preliminary Score: 5 4. How often during the last year have you found that you were not able to stop drinking once you had started?: Weekly 5. How often during the last year have you failed to do what was normally expected from you becasue of drinking?: Weekly 6. How often during the last year have you needed a first drink in the morning to get yourself going after a heavy drinking session?: Weekly 7. How often during the last year have you had a feeling of guilt of remorse  after drinking?: Weekly 8. How often during the last year have you been unable to remember what happened the night before because you had been drinking?: Weekly 9. Have you or someone else been injured as a result of your drinking?: Yes, during the last year 10. Has a relative or friend or a doctor or another health worker been concerned about your drinking or suggested you cut down?: Yes, during the last year Alcohol Use Disorder Identification Test Final Score (AUDIT): 32 Brief Intervention: Yes Substance Abuse History in the last 12 months:  Yes.   Consequences of Substance Abuse: Destabilization Previous Psychotropic Medications: No  Psychological Evaluations: No  Past Medical History:  Past Medical History  Diagnosis Date  . Chronic systolic heart failure (HCC)     a) Mixed ICM/NICM b) RHC (05/2014): RA 2, RV 19/2/3, PA 22/14 (18), PCWP 6, Fick CO/CI: 5.2 / 2.7, PVR 2.3 WU, PA 60% and 64% c) ECHO (05/2014): EF 20-25%, diff HK, akinesis entireanteroseptal myocardium, triv AI, mod MR, LA mod/sev dilated  . Automatic implantable cardioverter-defibrillator in situ   . Heart murmur   . Sleep apnea     "cleared after T&A"  . 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[place])   . Ischemic cardiomyopathy     a) Coronary angiography (12/2008) at [facility]: Lmain: nl, LAD mid 100% stenosis with left to left and right-to-left collaterals to the distal LAD; Lcx: nl, RCA nl.    . Type II diabetes mellitus ([place])   . High cholesterol   . Pneumonia 1988  . Drug abuse and  dependence ([place])   . CHF (congestive heart failure) [facility])     Past Surgical History  Procedure Laterality Date  . Forearm fracture surgery Left 1980  . Cardiac defibrillator placement  03/2011  . Cholecystectomy  1994  . Right heart catheterization N/A 05/30/2014    Procedure: RIGHT HEART CATH;  Surgeon: Jolaine Artist, MD;  [facility] C[facility] LAB;  Service: Cardiovascular;  Laterality: N/A;  . Fracture surgery    . Cardiac catheterization   05/2014  . Tonsillectomy and adenoidectomy  ~ 1998  . Right heart catheterization N/A 02/07/2015    Procedure: RIGHT HEART CATH;  Surgeon: Jolaine Artist, MD;  Location: [facility] CATH LAB;  Service: Cardiovascular;  Laterality: N/A;   Family History:  Family History  Problem Relation Age of Onset  . Diabetes Mother   . Heart disease Mother   . Diabetes Father   . Prostate cancer Father   . Heart disease Father   . Diabetes Brother   . Diabetes Brother    Family Psychiatric  History: Denies Social History:  History  Alcohol Use  . 0.0 oz/week  . 0 Standard drinks or equivalent per week    Comment: 09/21/2014 "might have a drink q 6 /months, if that"     History  Drug Use  . Yes  . Special: Cocaine    Comment: 09/21/2014 "last cocaine ~ 1 1/2 months ago"    Social History   Social History  . Marital Status: Single    Spouse Name: N/A  . Number of Children: 1  . Years of Education: 14   Occupational History  . Disabled    Social History Main Topics  . Smoking status: Current Some Day Smoker -- 0.25 packs/day for 31 years    Types: Cigarettes  . Smokeless tobacco: Never Used  . Alcohol Use: 0.0 oz/week    0 Standard drinks or equivalent per week     Comment: 09/21/2014 "might have a drink q 6 /months, if that"  . Drug Use: Yes    Special: Cocaine     Comment: 09/21/2014 "last cocaine ~ 1 1/2 months ago"  . Sexual Activity: Yes   Other Topics Concern  . None   Social History Narrative   Lives in [place] with his mother. No pets. Fun: movies, sports, daughter.    Denies religious beliefs that would effect health care.    Additional Social History:    Pain Medications: None Prescriptions: See PTA medication list Over the Counter: See PTA medication list History of alcohol / drug use?: Yes Longest period of sobriety (when/how long): 4 months in 2008 Negative Consequences of Use: Personal relationships, Work / School Onset of Seizures: Withdrawal Date of  most recent seizure: "at the beginning of this year (2016)" Name of Substance 1: ETOH (beer & liquor) 1 - Age of First Use: 54 years of age 35 - Amount (size/oz): Pint of liquor & a 12 pack of beer 1 - Frequency: Daily 1 - Duration: Last 5 years 1 - Last Use / Amount: 08/13/15 Name of Substance 2: Crack cocaine 2 - Age of First Use: In his 20's 2 - Amount (size/oz): About an ounce in the last 3 days 2 - Frequency: Daily 2 - Duration: Last three days.  Usually 4-5 days in a week 2 - Last Use / Amount: 08/13/15                Allergies:  No Known Allergies Lab Results:  Results  for orders placed or performed during the hospital encounter of 08/17/15 (from the past 48 hour(s))  Glucose, capillary     Status: Abnormal   Collection Time: 08/17/15 10:55 PM  Result Value Ref Range   Glucose-Capillary 365 (H) 65 - 99 mg/dL  Glucose, capillary     Status: None   Collection Time: 08/18/15  1:55 AM  Result Value Ref Range   Glucose-Capillary 95 65 - 99 mg/dL  Glucose, capillary     Status: None   Collection Time: 08/18/15  6:07 AM  Result Value Ref Range   Glucose-Capillary 99 65 - 99 mg/dL  Glucose, capillary     Status: Abnormal   Collection Time: 08/18/15 11:49 AM  Result Value Ref Range   Glucose-Capillary 262 (H) 65 - 99 mg/dL   Comment 1 Notify RN    Comment 2 Document in Chart   Glucose, capillary     Status: Abnormal   Collection Time: 08/18/15  5:20 PM  Result Value Ref Range   Glucose-Capillary 288 (H) 65 - 99 mg/dL    Metabolic Disorder Labs:  Lab Results  Component Value Date   HGBA1C 13.3* 01/24/2015   MPG 335 01/24/2015   MPG 255* 09/21/2014   No results found for: PROLACTIN Lab Results  Component Value Date   CHOL 197 01/25/2015   TRIG 42 01/25/2015   HDL 73 01/25/2015   CHOLHDL 2.7 01/25/2015   VLDL 8 01/25/2015   LDLCALC 116* 01/25/2015   LDLCALC 48 11/16/2014    Current Medications: Current Facility-Administered Medications  Medication Dose  Route Frequency Provider Last Rate Last Dose  . acetaminophen (TYLENOL) tablet 650 mg  650 mg Oral Q6H PRN Harriet Butte, NP      . alum & mag hydroxide-simeth (MAALOX/MYLANTA) 200-200-20 MG/5ML suspension 30 mL  30 mL Oral Q4H PRN Harriet Butte, NP      . atorvastatin (LIPITOR) tablet 20 mg  20 mg Oral q1800 Harriet Butte, NP   20 mg at 08/18/15 1724  . digoxin (LANOXIN) tablet 0.25 mg  0.25 mg Oral Daily Harriet Butte, NP   0.25 mg at 08/18/15 0843  . furosemide (LASIX) tablet 40 mg  40 mg Oral QODAY Harriet Butte, NP   40 mg at 08/18/15 1220  . hydrOXYzine (ATARAX/VISTARIL) tablet 25 mg  25 mg Oral Q6H PRN Charlcie Cradle, MD      . insulin aspart (novoLOG) injection 0-5 Units  0-5 Units Subcutaneous QHS Harriet Butte, NP   5 Units at 08/17/15 2319  . insulin aspart (novoLOG) injection 0-9 Units  0-9 Units Subcutaneous TID WC Harriet Butte, NP   5 Units at 08/18/15 1726  . insulin glargine (LANTUS) injection 30 Units  30 Units Subcutaneous Q2200 Harriet Butte, NP   30 Units at 08/17/15 2318  . loperamide (IMODIUM) capsule 2-4 mg  2-4 mg Oral PRN Charlcie Cradle, MD      . LORazepam (ATIVAN) tablet 1 mg  1 mg Oral Q6H PRN Charlcie Cradle, MD      . losartan (COZAAR) tablet 12.5 mg  12.5 mg Oral Daily Harriet Butte, NP   12.5 mg at 08/18/15 0844  . magnesium hydroxide (MILK OF MAGNESIA) suspension 30 mL  30 mL Oral Daily PRN Harriet Butte, NP      . metFORMIN (GLUCOPHAGE-XR) 24 hr tablet 500 mg  500 mg Oral BID WC Benjamine Mola, FNP      . multivitamin with minerals  tablet 1 tablet  1 tablet Oral Daily Charlcie Cradle, MD   1 tablet at 08/18/15 1437  . ondansetron (ZOFRAN-ODT) disintegrating tablet 4 mg  4 mg Oral Q6H PRN Charlcie Cradle, MD      . PARoxetine (PAXIL) tablet 20 mg  20 mg Oral QHS Charlcie Cradle, MD      . spironolactone (ALDACTONE) tablet 12.5 mg  12.5 mg Oral Daily Harriet Butte, NP   12.5 mg at 08/18/15 0844  . [START ON 08/19/2015] thiamine (VITAMIN B-1) tablet  100 mg  100 mg Oral Daily Charlcie Cradle, MD       PTA Medications: Prescriptions prior to admission  Medication Sig Dispense Refill Last Dose  . atorvastatin (LIPITOR) 20 MG tablet Take 1 tablet (20 mg total) by mouth daily at 6 PM. 30 tablet 3 08/17/2015 at Unknown time  . furosemide (LASIX) 40 MG tablet Take 40 mg by mouth every other day.   08/16/2015 at Unknown time  . Insulin Glargine (LANTUS) 100 UNIT/ML Solostar Pen Inject 30 Units into the skin daily at 10 pm. Dispense pen and needles 15 mL 1 08/16/2015 at Unknown time  . losartan (COZAAR) 25 MG tablet Take 0.5 tablets (12.5 mg total) by mouth daily. 15 tablet 3 08/16/2015 at Unknown time  . warfarin (COUMADIN) 2 MG tablet TAKE 3 & 1/2 TABLETS BY MOUTH DAILY AT 6PM 119 tablet 0 08/16/2015 at Unknown time  . warfarin (COUMADIN) 7.5 MG tablet Take as directed by coumadin clinic (Patient taking differently: Take 7.5 mg by mouth daily at 6 PM. ) 30 tablet 1 08/16/2015 at Unknown time  . digoxin (LANOXIN) 0.25 MG tablet Take 1 tablet (0.25 mg total) by mouth daily. 30 tablet 11 Unknown at Unknown time  . furosemide (LASIX) 40 MG tablet TAKE 1 TABLET BY MOUTH DAILY 30 tablet 2 Unknown at Unknown time  . glucose blood (ONETOUCH VERIO) test strip Use as instructed 100 each 12 Taking  . hydrocerin (EUCERIN) CREA Apply 1 application topically 2 (two) times daily. (Patient not taking: Reported on 08/16/2015)  0 Not Taking at Unknown time  . insulin aspart (NOVOLOG FLEXPEN) 100 UNIT/ML FlexPen Use as directed. (Patient not taking: Reported on 07/16/2015) 15 mL 11 Not Taking at Unknown time  . QC PEN NEEDLES 31G X 6 MM MISC 1 each by Other route See admin instructions. Use daily with insulin.   Taking  . spironolactone (ALDACTONE) 25 MG tablet Take 0.5 tablets (12.5 mg total) by mouth daily. 30 tablet 1 Unknown at Unknown time   Musculoskeletal: Strength & Muscle Tone: within normal limits Gait & Station: normal Patient leans: N/A  Psychiatric Specialty  Exam: Physical Exam  Review of Systems  Psychiatric/Behavioral: Positive for depression and substance abuse. Negative for suicidal ideas and hallucinations. The patient is nervous/anxious and has insomnia.   All other systems reviewed and are negative.   Blood pressure 94/68, pulse 82, temperature 98 F (36.7 C), temperature source Oral, resp. rate 16, height '5\' 10"'$  (1.778 m), weight 64.864 kg (143 lb).Body mass index is 20.52 kg/(m^2).  General Appearance: Casual and Fairly Groomed  Engineer, water::  Good  Speech:  Clear and Coherent and Normal Rate  Volume:  Normal  Mood:  Anxious and Depressed  Affect:  Appropriate, Congruent and Depressed  Thought Process:  Coherent and Goal Directed  Orientation:  Full (Time, Place, and Person)  Thought Content:  WDL  Suicidal Thoughts:  No  Homicidal Thoughts:  No  Memory:  Immediate;  Fair Recent;   Fair Remote;   Fair  Judgement:  Fair  Insight:  Fair  Psychomotor Activity:  Normal  Concentration:  Fair  Recall:  [employer] [place]  Language: Fair  Akathisia:  No  Handed:    AIMS (if indicated):     Assets:  Communication Skills Desire for Improvement Physical Health Resilience Social Support  ADL's:  Intact  Cognition: WNL  Sleep:  Number of Hours: 6.75   Treatment Plan Summary: Daily contact with patient to assess and evaluate symptoms and progress in treatment and Medication management  Medications:  -Start Metformin XR '500mg'$  bid for glucostabilization adjuvant to insulin sliding scales -Continue home Lipitor '20mg'$  daily, Lasix '40mg'$  daily for hyperlipidemia and HTN  -Continue Paxil '20mg'$  daily for MDD -Continue Insulin sliding scale for DM2 -Continue Digoxin 0.'25mg'$  daily for HTN and disrhythmia regulation (home med) -Continue Ativan protocol for detox with PRN Ativan only (q6h per CIWA on chart) -Continue Aldactone 12.'5mg'$  for HTN regulation -Continue Cozaar 12.'5mg'$  daily for HTN (chronic home med) -Start Trazodone  '50mg'$  qhs prn insomnia   Labs: -Reviewed CBC, CMP, BAL, UDS. CBC remarkable for glucose 369 although CBG's trending OK (pt had a cup of ice cream prior to this per report), Creatinine mildly elevated at 1.29, WBC 12.2 (H, but no s&s infection), PT/INR elevated and ordered pharmacy coumadin consult to titrate and monitor labwork as appropriate.   Observation Level/Precautions:  15 minute checks  Laboratory:  Labs resulted, reviewed, and stable at this time.   Psychotherapy:  Group therapy, individual therapy, psychoeducation  Medications:  See MAR above  Consultations: None    Discharge Concerns: None    Estimated LOS: 5-7 days  Other:  N/A   I certify that inpatient services furnished can reasonably be expected to improve the patient's condition.    Benjamine Mola, [place]  11/5/20166:42 PM

## 2015-08-18 NOTE — Progress Notes (Signed)
Adult Psychoeducational Group Note  Date:  08/18/2015 Time:  09:30  Group Topic/Focus:  Goals Group:   The focus of this group is to help patients establish daily goals to achieve during treatment and discuss how the patient can incorporate goal setting into their daily lives to aide in recovery.  Participation Level:  Did Not Attend  Participation Quality: n/a  Affect: n/a  Cognitive:  N/a  Insight: n/a  Engagement in Group: n/a  Modes of Intervention:  Activity, Education, Orientation and Support  Additional Comments:  Pt did not attend group. Pt in bed asleep.   Elenore Rota 08/18/2015, 10:11 AM

## 2015-08-18 NOTE — Progress Notes (Signed)
Patient did attend the evening speaker AA meeting.  

## 2015-08-18 NOTE — Progress Notes (Addendum)
Admission Note  54 y/o male admitted to the 300 hall complaining of severe depression, anxiety, SI, and substance abuse (crack cocaine). Pt with uncontrolled DM and implantable defib states, "It hard to live like this; sometimes I get so anxious and fearful then depressed for no reason" Pt stated goal are "get help with my depression" and "work on my substance abuse." Pt denies HI, AVH and pain. Pt was flat; however, cooperative and appreciative and calm through the assessment. Support, encouragement, and safe environment provided15-minute safety checks initiated and continued.

## 2015-08-18 NOTE — Progress Notes (Addendum)
ANTICOAGULATION CONSULT NOTE - Initial Consult  Pharmacy Consult for Coumadin Indication: Left ventricular noncompaction  No Known Allergies  Patient Measurements: Height: '5\' 10"'$  (177.8 cm) Weight: 143 lb (64.864 kg) IBW/kg (Calculated) : 73 Heparin Dosing Weight:   Vital Signs: Temp: 98 F (36.7 C) (11/05 0700) BP: 94/68 mmHg (11/05 0701) Pulse Rate: 82 (11/05 0701)  Labs:  Recent Labs  08/16/15 1959 08/17/15 0056 08/17/15 0922  HGB 15.3  --   --   HCT 45.4  --   --   PLT 201  --   --   LABPROT  --  39.3* 37.3*  INR  --  4.19* 3.91*  CREATININE 1.29*  --   --     Estimated Creatinine Clearance: 60.8 mL/min (by C-G formula based on Cr of 1.29).   Medical History: Past Medical History  Diagnosis Date  . Chronic systolic heart failure (HCC)     a) Mixed ICM/NICM b) RHC (05/2014): RA 2, RV 19/2/3, PA 22/14 (18), PCWP 6, Fick CO/CI: 5.2 / 2.7, PVR 2.3 WU, PA 60% and 64% c) ECHO (05/2014): EF 20-25%, diff HK, akinesis entireanteroseptal myocardium, triv AI, mod MR, LA mod/sev dilated  . Automatic implantable cardioverter-defibrillator in situ   . Heart murmur   . Sleep apnea     "cleared after T&A"  . 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[place])   . Ischemic cardiomyopathy     a) Coronary angiography (12/2008) at [facility]: Lmain: nl, LAD mid 100% stenosis with left to left and right-to-left collaterals to the distal LAD; Lcx: nl, RCA nl.    . Type II diabetes mellitus ([place])   . High cholesterol   . Pneumonia 1988  . Drug abuse and dependence ([place])   . CHF (congestive heart failure) (HCC)     Medications:  Scheduled:  . atorvastatin  20 mg Oral q1800  . digoxin  0.25 mg Oral Daily  . furosemide  40 mg Oral QODAY  . Influenza vac split quadrivalent PF  0.5 mL Intramuscular Tomorrow-1000  . insulin aspart  0-5 Units Subcutaneous QHS  . insulin aspart  0-9 Units Subcutaneous TID WC  . insulin glargine  30 Units Subcutaneous Q2200  . losartan  12.5 mg Oral Daily  .  pneumococcal 23 valent vaccine  0.5 mL Intramuscular Tomorrow-1000  . spironolactone  12.5 mg Oral Daily  . warfarin  7.5 mg Oral q1800   PRN: acetaminophen, alum & mag hydroxide-simeth, magnesium hydroxide  Assessment: 54 yo Dennis [name] admitted to [facility]. Patient has hx of Left ventricular noncompaction,  embolic stroke, CHF, and has ICD in situ Patient noted to have been  taking warfarin 7.5 mg daily prior to admission. Plt, H/H wnl Goal of Therapy:  INR 2-3    Plan: Previous INR from 11/4= 3.91 Will have INR drawn this evening to determine when warfarin can be resumed.  Gypsy Decant 08/18/2015,10:58 AM

## 2015-08-19 LAB — GLUCOSE, CAPILLARY
Glucose-Capillary: 127 mg/dL — ABNORMAL HIGH (ref 65–99)
Glucose-Capillary: 232 mg/dL — ABNORMAL HIGH (ref 65–99)
Glucose-Capillary: 139 mg/dL — ABNORMAL HIGH (ref 65–99)
Glucose-Capillary: 310 mg/dL — ABNORMAL HIGH (ref 65–99)

## 2015-08-19 LAB — PROTIME-INR
INR: 1.38 (ref 0.00–1.49)
Prothrombin Time: 17.1 seconds — ABNORMAL HIGH (ref 11.6–15.2)

## 2015-08-19 MED ORDER — GLUCERNA SHAKE PO LIQD
237.0000 mL | Freq: Three times a day (TID) | ORAL | Status: DC
  Administered 2015-08-19 – 2015-08-23 (×12): 237 mL via ORAL

## 2015-08-19 MED ORDER — TRAZODONE HCL 50 MG PO TABS
50.0000 mg | ORAL_TABLET | Freq: Every evening | ORAL | Status: DC | PRN
  Administered 2015-08-19: 50 mg via ORAL
  Filled 2015-08-19: qty 1

## 2015-08-19 MED ORDER — WARFARIN SODIUM 10 MG PO TABS
10.0000 mg | ORAL_TABLET | Freq: Once | ORAL | Status: AC
  Administered 2015-08-19: 10 mg via ORAL
  Filled 2015-08-19: qty 1

## 2015-08-19 NOTE — BHH Counselor (Signed)
Adult Comprehensive Assessment  Patient ID: Dennis [name], male   DOB: 1961/04/23, 54 y.o.   MRN: [id number]  Information Source: Information source: Patient  Current Stressors:  Educational / Learning stressors: Wishes he had a college degree Employment / Job issues: Is not working, is on disability Family Relationships: Not good right now Museum/gallery curator / Lack of resources (include bankruptcy): Not enough money Housing / Lack of housing: Does not like the environment he is living in. Physical health (include injuries & life threatening diseases): Diabetes, congestive heart failure Social relationships: Does not want to be around anybody, is keeping to himself Substance abuse: Has been using continuously since 2012, worsening Bereavement / Loss: Lost father last year, misses him dearly  Living/Environment/Situation:  Living Arrangements: Alone Living conditions (as described by patient or guardian): A lot of drugs in the environment, rooming house How long has patient lived in current situation?: all of 2016. What is atmosphere in current home: Temporary, Other (Comment), Chaotic (Too many drugs)  Family History:  Marital status: Single Does patient have children?: Yes How many children?: 1 How is patient's relationship with their children?: 23yo daughter - she is worried about him, gets to see her often  Childhood History:  By whom was/is the patient raised?: Both parents Description of patient's relationship with caregiver when they were a child: Good relationships, good childhood Patient's description of current relationship with people who raised him/her: Father died last year, relationshipwith mother is "okay" Does patient have siblings?: Yes Number of Siblings: 2 Description of patient's current relationship with siblings: Brothers - strained relationships because of his drug use Did patient suffer any verbal/emotional/physical/sexual abuse as a child?: No Did patient suffer from  severe childhood neglect?: No Has patient ever been sexually abused/assaulted/raped as an adolescent or adult?: No Was the patient ever a victim of a crime or a disaster?: No Witnessed domestic violence?: No Has patient been effected by domestic violence as an adult?: No  Education:  Highest grade of school patient has completed: High school graduate Currently a student?: No Learning disability?: No  Employment/Work Situation:   Employment situation: On disability Why is patient on disability: Congestive Heart Failure How long has patient been on disability: 2008 What is the longest time patient has a held a job?: 12 years 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, academic, [place] Has patient ever been in the [employer]?: No Has patient ever served in Recruitment consultant?: No  Financial Resources:   Museum/gallery curator resources: [employer], Receives SSI Does patient have a Programmer, applications or guardian?: No  Alcohol/Substance Abuse:   What has been your use of drugs/alcohol within the last 12 months?: Alcohol daily, crack cocaine daily Alcohol/Substance Abuse Treatment Hx: Past Tx, Inpatient, Past detox, Past Tx, Outpatient If yes, describe treatment: [facility], then was sober 4 years; [facility] 3-1/2 years; used to attend AA/NA and had a sponsor, not recently. Has alcohol/substance abuse ever caused legal problems?: Yes  Social Support System:   Patient's Community Support System: None Describe Community Support System: Nobody right now Type of faith/religion: Believes in God How does patient's faith help to cope with current illness?: Prays to God  Leisure/Recreation:   Leisure and Hobbies: "So long ago."  Strengths/Needs:   What things does the patient do well?: "I really don't know no more." In what areas does patient struggle / problems for patient: Addiction, finances, faith  Discharge Plan:   Does patient have access to transportation?: No Plan for no access to  transportation  at discharge: Bus pass needed Will patient be returning to same living situation after discharge?: No Plan for living situation after discharge: Wants to go to rehab, wants to go somewhere different to live afterward.  Would like to apply for Section 8 housing. Currently receiving community mental health services: No If no, would patient like referral for services when discharged?: Yes (What county?) ([place] now, but wants rehab at [facility] maybe) Does patient have financial barriers related to discharge medications?: No  Summary/Recommendations:   Summary and Recommendations (to be completed by the evaluator):   [name] is a 55yo male hospitalized with worsening depression and suicidal ideations with a plan to jump in front of a train.  Is severely isolative, has disability for medical issues including congestive heart failure. Has a long hx of substance abuse, reporting that he uses $50 worth of crack cocaine and a pint of EOTH on a near-daily basis since 2012. He has a hx of seizures, with the most recent one being in 10/2014.  He went to [facility] years ago and then to an [place], was sober for 4 years, would like to try to get into [facility] again.  Has no mental health providers.  The patient would benefit from safety monitoring, medication evaluation, psychoeducation, group therapy, and discharge planning to link with ongoing resources. The patient refused referral to [facility] for smoking cessation.  The Discharge Process and Patient Involvement form was reviewed, signed and placed in the paper chart. Suicide Prevention Education was reviewed thoroughly, and a brochure left with patient.  The patient refused consent for SPE to be provided to anyone.   Dennis [name]. 08/19/2015

## 2015-08-19 NOTE — Progress Notes (Signed)
Writer observed patient sitting in the dayroom watching tv interacting with select peers. Writer spoke with him 1:1 and he reports that his day has been okay because he had difficulty sleeping last night. He reports that it was around 3/4 am before he went to sleep. Writer reviewed his medications and we decided to take his medication a little earlier tonight and see if this would help. He reports that he was actually admitted to [facility] on last Monday or Tuesday but was unable to stay because his blood sugars were not stable. He is hopeful to be able to return there once discharged and would like his social worker to help him to follow up on this. He seems sincere in wanting to get help and reports that he knows that his trigger for using is money and normally when he gets his check he has blown it in about 2-3 days on drugs. Writer offered him support and encouragement, safety maintained on unit with 15 min checks.

## 2015-08-19 NOTE — Progress Notes (Signed)
[facility] MD Progress Note  08/19/2015 4:26 PM Dennis [name]  MRN:  [id number] Evaluation:  Patient is awake and alert, pleasant and cooperative.  Patient reports that he has been very depressed and is seeking a inpatient long term treatment program to help him get off drugs. Reports that he was in residential treatment in the past and was able to "clean" for 4 years. Reports using cocaine and alcohol chronically to help with  his depression and anxiety. Reports he didn't  attended group session today due to taking medication to late, (trazodone)"this cause me to sleep most of the day".  Patient  denies suicidal or homicidal ideation. Reports resting well and hs a good appetite. Support, encouragement and reassurance was provided.    Principal Problem: MDD (major depressive disorder), recurrent severe, without psychosis ([place]) Diagnosis:   Patient Active Problem List   Diagnosis Date Noted  . MDD (major depressive disorder), recurrent severe, without psychosis ([place]) [F33.2] 08/18/2015    Priority: Medium  . Alcohol abuse with alcohol-induced mood disorder ([place]) [F10.14] 08/18/2015  . Substance induced mood disorder ([place]) [F19.94] 08/17/2015  . Psychoactive substance-induced mood disorder ([place]) [O75.64, F06.30] 07/17/2015  . Open wound of foot [S91.309A] 02/27/2015  . Acute CVA (cerebrovascular accident) ([place]) [I63.9] 02/07/2015  . HLD (hyperlipidemia) [E78.5]   . Status post PICC central line placement [Z95.828]   . History of ischemic right MCA stroke [Z86.73]   . Type 2 diabetes mellitus with complication (HCC) [P32.9]   . Acute on chronic systolic congestive heart failure ([place]) [I50.23]   . Stroke [facility]) [I63.9]   . Stroke with cerebral ischemia ([place]) [I63.9]   . CVA (cerebral infarction) [I63.9] 01/26/2015  . Left-sided weakness [M62.89]   . Embolic stroke involving right middle cerebral artery ([place]) [I63.411]   . Cardiac LV ejection fraction 10-20% [R94.30]   . LV non-compaction  cardiomyopathy ([place]) [I42.8]   . Acute systolic heart failure ([place]) [I50.21] 01/24/2015  . Acute ischemic stroke ([place]) [I63.9] 01/24/2015  . Cocaine abuse [F14.10] 01/24/2015  . H/O noncompliance with medical treatment, presenting hazards to health [Z91.19] 01/24/2015  . Tobacco abuse [Z72.0] 01/24/2015  . Essential hypertension [I10] 01/24/2015  . Hypotension [I95.9] 01/24/2015  . Hyperlipidemia [E78.5] 01/24/2015  . Dysphagia following cerebral infarction [I69.391] 01/24/2015  . Hemiplegia affecting left nondominant side ([place]) [G81.94] 01/24/2015  . Diabetes mellitus type 2, uncontrolled, with complications ([place]) [J18.8, E11.65] 01/24/2015  . Hyperlipidemia associated with type 2 diabetes mellitus ([place]) [E11.69, E78.5] 11/30/2014  . CAD (coronary artery disease) [I25.10] 11/16/2014  . Acute on chronic systolic CHF (congestive heart failure) ([place]) [I50.23] 09/21/2014  . Chest pain on breathing [R07.1] 09/21/2014  . Chronic systolic heart failure ([place]) [I50.22] 06/20/2014  . Acute on chronic systolic heart failure ([place]) [I50.23]   . Automatic implantable cardioverter-defibrillator in situ [Z95.810]   . 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[place]) [I42.8] 05/27/2014  . Dyspnea [R06.00] 05/26/2014  . Chest discomfort [R07.89] 08/09/2013  . Long term (current) use of anticoagulants [Z79.01] 08/09/2013  . Drug abuse and dependence ([place]) [F19.20]   . CHF (congestive heart failure) ([place]) [I50.9]   . BP (high blood pressure) [I10] 03/04/2013  . Cardiomyopathy, ischemic [I25.5] 07/21/2011  . Type II diabetes mellitus with ophthalmic manifestations ([place]) [E11.39] 09/13/2007  . TOBACCO ABUSE [F17.200] 09/13/2007  . WEIGHT LOSS, ABNORMAL [R63.4] 09/13/2007  . SOB [R06.02] 09/13/2007   Total Time spent with patient: 45 minutes  Past Psychiatric History: MDD, Drug Abuse, Substance mood disorder  Past Medical History:  Past Medical  History  Diagnosis Date  . Chronic systolic heart failure (HCC)      a) Mixed ICM/NICM b) RHC (05/2014): RA 2, RV 19/2/3, PA 22/14 (18), PCWP 6, Fick CO/CI: 5.2 / 2.7, PVR 2.3 WU, PA 60% and 64% c) ECHO (05/2014): EF 20-25%, diff HK, akinesis entireanteroseptal myocardium, triv AI, mod MR, LA mod/sev dilated  . Automatic implantable cardioverter-defibrillator in situ   . Heart murmur   . Sleep apnea     "cleared after T&A"  . 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[place])   . Ischemic cardiomyopathy     a) Coronary angiography (12/2008) at [facility]: Lmain: nl, LAD mid 100% stenosis with left to left and right-to-left collaterals to the distal LAD; Lcx: nl, RCA nl.    . Type II diabetes mellitus ([place])   . High cholesterol   . Pneumonia 1988  . Drug abuse and dependence ([place])   . CHF (congestive heart failure) [facility])     Past Surgical History  Procedure Laterality Date  . Forearm fracture surgery Left 1980  . Cardiac defibrillator placement  03/2011  . Cholecystectomy  1994  . Right heart catheterization N/A 05/30/2014    Procedure: RIGHT HEART CATH;  Surgeon: Jolaine Artist, MD;  Location: [facility] CATH LAB;  Service: Cardiovascular;  Laterality: N/A;  . Fracture surgery    . Cardiac catheterization  05/2014  . Tonsillectomy and adenoidectomy  ~ 1998  . Right heart catheterization N/A 02/07/2015    Procedure: RIGHT HEART CATH;  Surgeon: Jolaine Artist, MD;  Location: [facility] CATH LAB;  Service: Cardiovascular;  Laterality: N/A;   Family History:  Family History  Problem Relation Age of Onset  . Diabetes Mother   . Heart disease Mother   . Diabetes Father   . Prostate cancer Father   . Heart disease Father   . Diabetes Brother   . Diabetes Brother    Family Psychiatric  History: See HPI Social History:  History  Alcohol Use  . 0.0 oz/week  . 0 Standard drinks or equivalent per week    Comment: 09/21/2014 "might have a drink q 6 /months, if that"     History  Drug Use  . Yes  . Special: Cocaine    Comment: 09/21/2014 "last cocaine ~ 1 1/2 months ago"     Social History   Social History  . Marital Status: Single    Spouse Name: N/A  . Number of Children: 1  . Years of Education: 14   Occupational History  . Disabled    Social History Main Topics  . Smoking status: Current Some Day Smoker -- 0.25 packs/day for 31 years    Types: Cigarettes  . Smokeless tobacco: Never Used  . Alcohol Use: 0.0 oz/week    0 Standard drinks or equivalent per week     Comment: 09/21/2014 "might have a drink q 6 /months, if that"  . Drug Use: Yes    Special: Cocaine     Comment: 09/21/2014 "last cocaine ~ 1 1/2 months ago"  . Sexual Activity: Yes   Other Topics Concern  . None   Social History Narrative   Lives in [place] with his mother. No pets. Fun: movies, sports, daughter.    Denies religious beliefs that would effect health care.    Additional Social History:    Pain Medications: None Prescriptions: See PTA medication list Over the Counter: See PTA medication list History of alcohol / drug use?: Yes Longest period of sobriety (when/how long): 4 months  in 2008 Negative Consequences of Use: Personal relationships, Work / School Onset of Seizures: Withdrawal Date of most recent seizure: "at the beginning of this year (2016)" Name of Substance 1: ETOH (beer & liquor) 1 - Age of First Use: 54 years of age 31 - Amount (size/oz): Pint of liquor & a 12 pack of beer 1 - Frequency: Daily 1 - Duration: Last 5 years 1 - Last Use / Amount: 08/13/15 Name of Substance 2: Crack cocaine 2 - Age of First Use: In his 20's 2 - Amount (size/oz): About an ounce in the last 3 days 2 - Frequency: Daily 2 - Duration: Last three days.  Usually 4-5 days in a week 2 - Last Use / Amount: 08/13/15                Sleep: Good  Appetite:  Good  Current Medications: Current Facility-Administered Medications  Medication Dose Route Frequency Provider Last Rate Last Dose  . acetaminophen (TYLENOL) tablet 650 mg  650 mg Oral Q6H PRN Harriet Butte, NP       . alum & mag hydroxide-simeth (MAALOX/MYLANTA) 200-200-20 MG/5ML suspension 30 mL  30 mL Oral Q4H PRN Harriet Butte, NP      . atorvastatin (LIPITOR) tablet 20 mg  20 mg Oral q1800 Harriet Butte, NP   20 mg at 08/18/15 1724  . digoxin (LANOXIN) tablet 0.25 mg  0.25 mg Oral Daily Harriet Butte, NP   0.25 mg at 08/19/15 0835  . feeding supplement (GLUCERNA SHAKE) (GLUCERNA SHAKE) liquid 237 mL  237 mL Oral TID BM Clayton Bibles, RD   237 mL at 08/19/15 1429  . furosemide (LASIX) tablet 40 mg  40 mg Oral QODAY Harriet Butte, NP   40 mg at 08/18/15 1220  . hydrOXYzine (ATARAX/VISTARIL) tablet 25 mg  25 mg Oral Q6H PRN Charlcie Cradle, MD   25 mg at 08/19/15 0100  . insulin aspart (novoLOG) injection 0-5 Units  0-5 Units Subcutaneous QHS Harriet Butte, NP   5 Units at 08/17/15 2319  . insulin aspart (novoLOG) injection 0-9 Units  0-9 Units Subcutaneous TID WC Harriet Butte, NP   1 Units at 08/19/15 1200  . insulin glargine (LANTUS) injection 30 Units  30 Units Subcutaneous Q2200 Harriet Butte, NP   30 Units at 08/18/15 2131  . loperamide (IMODIUM) capsule 2-4 mg  2-4 mg Oral PRN Charlcie Cradle, MD      . LORazepam (ATIVAN) tablet 1 mg  1 mg Oral Q6H PRN Charlcie Cradle, MD   1 mg at 08/19/15 0100  . losartan (COZAAR) tablet 12.5 mg  12.5 mg Oral Daily Harriet Butte, NP   12.5 mg at 08/19/15 0835  . magnesium hydroxide (MILK OF MAGNESIA) suspension 30 mL  30 mL Oral Daily PRN Harriet Butte, NP      . multivitamin with minerals tablet 1 tablet  1 tablet Oral Daily Charlcie Cradle, MD   1 tablet at 08/19/15 0836  . ondansetron (ZOFRAN-ODT) disintegrating tablet 4 mg  4 mg Oral Q6H PRN Charlcie Cradle, MD      . PARoxetine (PAXIL) tablet 20 mg  20 mg Oral QHS Charlcie Cradle, MD   20 mg at 08/18/15 2208  . spironolactone (ALDACTONE) tablet 12.5 mg  12.5 mg Oral Daily Harriet Butte, NP   12.5 mg at 08/19/15 0836  . thiamine (VITAMIN B-1) tablet 100 mg  100 mg Oral Daily Charlcie Cradle, MD  100 mg at 08/19/15 0836  . warfarin (COUMADIN) tablet 10 mg  10 mg Oral ONCE-1800 Julien Girt, RPH      . Warfarin - Pharmacist Dosing Inpatient   Does not apply q1800 Lynelle Doctor, [facility]        Lab Results:  Results for orders placed or performed during the hospital encounter of 08/17/15 (from the past 48 hour(s))  Glucose, capillary     Status: Abnormal   Collection Time: 08/17/15 10:55 PM  Result Value Ref Range   Glucose-Capillary 365 (H) 65 - 99 mg/dL  Glucose, capillary     Status: None   Collection Time: 08/18/15  1:55 AM  Result Value Ref Range   Glucose-Capillary 95 65 - 99 mg/dL  Glucose, capillary     Status: None   Collection Time: 08/18/15  6:07 AM  Result Value Ref Range   Glucose-Capillary 99 65 - 99 mg/dL  Glucose, capillary     Status: Abnormal   Collection Time: 08/18/15 11:49 AM  Result Value Ref Range   Glucose-Capillary 262 (H) 65 - 99 mg/dL   Comment 1 Notify RN    Comment 2 Document in Chart   Glucose, capillary     Status: Abnormal   Collection Time: 08/18/15  5:20 PM  Result Value Ref Range   Glucose-Capillary 288 (H) 65 - 99 mg/dL  Protime-INR     Status: Abnormal   Collection Time: 08/18/15  6:25 PM  Result Value Ref Range   Prothrombin Time 18.5 (H) 11.6 - 15.2 seconds   INR 1.53 (H) 0.00 - 1.49    Comment: Performed at [facility]     Status: Abnormal   Collection Time: 08/19/15  6:14 AM  Result Value Ref Range   Prothrombin Time 17.1 (H) 11.6 - 15.2 seconds   INR 1.38 0.00 - 1.49    Comment: Performed at [facility]    Physical Findings: AIMS: Facial and Oral Movements Muscles of Facial Expression: None, normal Lips and Perioral Area: None, normal Jaw: None, normal Tongue: None, normal,Extremity Movements Upper (arms, wrists, hands, fingers): None, normal Lower (legs, knees, ankles, toes): None, normal, Trunk Movements Neck, shoulders, hips: None, normal, Overall Severity Severity of  abnormal movements (highest score from questions above): None, normal Incapacitation due to abnormal movements: None, normal Patient's awareness of abnormal movements (rate only patient's report): No Awareness, Dental Status Current problems with teeth and/or dentures?: No Does patient usually wear dentures?: No  CIWA:  CIWA-Ar Total: 1 COWS:  COWS Total Score: 3  Musculoskeletal: Strength & Muscle Tone: within normal limits Gait & Station: normal Patient leans: N/A  Psychiatric Specialty Exam: Review of Systems  Psychiatric/Behavioral: Positive for depression, suicidal ideas and substance abuse. Negative for hallucinations. The patient is nervous/anxious.   All other systems reviewed and are negative.   Blood pressure 100/68, pulse 98, temperature 97.7 F (36.5 C), temperature source Oral, resp. rate 20, height '5\' 10"'$  (1.778 m), weight 64.864 kg (143 lb).Body mass index is 20.52 kg/(m^2).  General Appearance: Casual and Neat  Eye Contact::  Fair  Speech:  Clear and Coherent and Normal Rate  Volume:  Normal  Mood:  Anxious and Depressed  Affect:  Appropriate and Congruent  Thought Process:  Coherent, Linear and Logical  Orientation:  Full (Time, Place, and Person)  Thought Content:  Hallucinations: None  Suicidal Thoughts:  Yes.  without intent/plan  Homicidal Thoughts:  No  Memory:  Immediate;  Fair  Judgement:  Fair  Insight:  Fair  Psychomotor Activity:  Normal  Concentration:  Fair  Recall:  [employer] [place]  Language: Good  Akathisia:  No  Handed:  Left  AIMS (if indicated):     Assets:  Communication Skills Desire for Improvement Financial Resources/Insurance Housing Transportation Vocational/Educational  ADL's:  Intact  Cognition: WNL  Sleep:  Number of Hours: 3.25    I agree with current treatment plan Treatment Plan Summary: Daily contact with patient to assess and evaluate symptoms and progress in treatment and Medication management   -Start Metformin XR '500mg'$  bid for gluco stabilization adjuvant to insulin sliding scales -Continue home Lipitor '20mg'$  daily, Lasix '40mg'$  daily for hyperlipidemia and HTN  -Continue Paxil '20mg'$  daily for MDD -Continue Insulin sliding scale for DM2 -Continue Digoxin 0.'25mg'$  daily for HTN and dysrhythmia regulation (home med) -Continue Ativan protocol for detox with PRN Ativan only (q6h per CIWA on chart) -Continue Aldactone 12.'5mg'$  for HTN regulation -Continue Cozaar 12.'5mg'$  daily for HTN (chronic home med) -Start Trazodone '50mg'$  qhs prn insomnia   Derrill Center FNP- [facility] 08/19/2015, 4:26 PM

## 2015-08-19 NOTE — BHH Suicide Risk Assessment (Signed)
[place] INPATIENT:  Family/Significant Other Suicide Prevention Education  Suicide Prevention Education:  Patient Refusal for Family/Significant Other Suicide Prevention Education: The patient Dennis [name] has refused to provide written consent for family/significant other to be provided Family/Significant Other Suicide Prevention Education during admission and/or prior to discharge.  Physician notified.  Lysle Dingwall 08/19/2015, 3:26 PM

## 2015-08-19 NOTE — Progress Notes (Signed)
ANTICOAGULATION CONSULT NOTE - Follow Up Consult  Pharmacy Consult for Coumadin Indication: Left ventricular noncompaction  Allergies  Allergen Reactions  . Metformin And Related Other (See Comments)    Pt reports severe reaction to this and other provider took him off this med    Patient Measurements: Height: '5\' 10"'$  (177.8 cm) Weight: 143 lb (64.864 kg) IBW/kg (Calculated) : 73 Heparin Dosing Weight:   Vital Signs: Temp: 97.7 F (36.5 C) (11/06 0700) BP: 97/78 mmHg (11/06 0701) Pulse Rate: 98 (11/06 0701)  Labs:  Recent Labs  08/16/15 1959  08/17/15 0922 08/18/15 1825 08/19/15 0614  HGB 15.3  --   --   --   --   HCT 45.4  --   --   --   --   PLT 201  --   --   --   --   LABPROT  --   < > 37.3* 18.5* 17.1*  INR  --   < > 3.91* 1.53* 1.38  CREATININE 1.29*  --   --   --   --   < > = values in this interval not displayed.  Estimated Creatinine Clearance: 60.8 mL/min (by C-G formula based on Cr of 1.29).   Medications:  Scheduled:  . atorvastatin  20 mg Oral q1800  . digoxin  0.25 mg Oral Daily  . furosemide  40 mg Oral QODAY  . insulin aspart  0-5 Units Subcutaneous QHS  . insulin aspart  0-9 Units Subcutaneous TID WC  . insulin glargine  30 Units Subcutaneous Q2200  . losartan  12.5 mg Oral Daily  . multivitamin with minerals  1 tablet Oral Daily  . PARoxetine  20 mg Oral QHS  . spironolactone  12.5 mg Oral Daily  . thiamine  100 mg Oral Daily  . Warfarin - Pharmacist Dosing Inpatient   Does not apply q1800   PRN: acetaminophen, alum & mag hydroxide-simeth, hydrOXYzine, loperamide, LORazepam, magnesium hydroxide, ondansetron  Assessment: 54 yo M admitted [facility]. Patient has hx of Left ventricular noncompaction, embolic stroke, CHF, and has ICD in situ  Goal of Therapy:  INR 2-3    Plan:  INR=1.38 following Coumadin 7.'5mg'$  X1 last evening. Coumadin '10mg'$  X1 at 1800 PT/INR in AM.   Gypsy Decant 08/19/2015,8:00 AM

## 2015-08-19 NOTE — Progress Notes (Signed)
NUTRITION ASSESSMENT  Pt identified as at risk on the Malnutrition Screen Tool  INTERVENTION: 1. Supplements: Glucerna Shake po TID, each supplement provides 220 kcal and 10 grams of protein  NUTRITION DIAGNOSIS: Unintentional weight loss related to sub-optimal intake as evidenced by pt report.   Goal: Pt to meet >/= 90% of their estimated nutrition needs.  Monitor:  PO intake  Assessment:  Pt admitted with ETOH and cocaine use and diabetes. Pt has lost 17 lb since 2/18 (11% weight loss x 9 months, insignificant for time frame). Pt reports drinking a 12 pack of beer daily. Pt would benefit from nutritional supplementation, RD to order Glucerna shakes.  Height: Ht Readings from Last 1 Encounters:  08/17/15 '5\' 10"'$  (1.778 m)    Weight: Wt Readings from Last 1 Encounters:  08/17/15 143 lb (64.864 kg)    Weight Hx: Wt Readings from Last 10 Encounters:  08/17/15 143 lb (64.864 kg)  08/16/15 150 lb (68.04 kg)  02/28/15 157 lb (71.215 kg)  02/27/15 157 lb (71.215 kg)  02/13/15 148 lb 9.4 oz (67.4 kg)  02/07/15 156 lb 8 oz (70.988 kg)  02/01/15 149 lb 11.1 oz (67.9 kg)  01/26/15 148 lb 3.2 oz (67.223 kg)  12/01/14 157 lb 6.4 oz (71.396 kg)  11/30/14 160 lb (72.576 kg)    BMI:  Body mass index is 20.52 kg/(m^2). Pt meets criteria for normal range based on current BMI.  Estimated Nutritional Needs: Kcal: 25-30 kcal/kg Protein: > 1 gram protein/kg Fluid: 1 ml/kcal  Diet Order: Diet Carb Modified Fluid consistency:: Thin; Room service appropriate?: Yes Pt is also offered choice of unit snacks mid-morning and mid-afternoon.  Pt is eating as desired.   Lab results and medications reviewed.   Clayton Bibles, MS, RD, LDN Pager: [phone] After Hours Pager: [phone]

## 2015-08-19 NOTE — Progress Notes (Signed)
D: Pt presents with flat affect and depressed mood. Pt rates depression 5/10. Pt endorses suicidal thoughts w/o plan. Pt verbally contracts not to harm self and agrees to talk to staff. Pt reported fair sleep last night. Pt reported feeling sluggish d/t sleep med at HS. Pt have little interaction on the unit today and spends most of his time in bed. Pt reported shakes and tremors this morning. None noted by Probation officer. Pt compliant with taking meds. No adverse reaction to meds verbalized by pt. A: Medications administered as ordered per MD. Verbal support given. Pt encouraged to attend groups. 15 minute checks performed for safety. R: Pt receptive to tx.

## 2015-08-19 NOTE — B[facility] Group Notes (Signed)
[place] Group Notes: (Clinical Social Work)   08/19/2015      Type of Therapy:  Group Therapy   Participation Level:  Did Not Attend despite MHT prompting   Selmer Dominion, LCSW 08/19/2015, 11:21 AM

## 2015-08-20 DIAGNOSIS — F1994 Other psychoactive substance use, unspecified with psychoactive substance-induced mood disorder: Secondary | ICD-10-CM

## 2015-08-20 DIAGNOSIS — F1414 Cocaine abuse with cocaine-induced mood disorder: Secondary | ICD-10-CM

## 2015-08-20 HISTORY — DX: Cocaine abuse with cocaine-induced mood disorder: F14.14

## 2015-08-20 LAB — GLUCOSE, CAPILLARY
Glucose-Capillary: 158 mg/dL — ABNORMAL HIGH (ref 65–99)
Glucose-Capillary: 113 mg/dL — ABNORMAL HIGH (ref 65–99)
Glucose-Capillary: 251 mg/dL — ABNORMAL HIGH (ref 65–99)
Glucose-Capillary: 243 mg/dL — ABNORMAL HIGH (ref 65–99)
Glucose-Capillary: 254 mg/dL — ABNORMAL HIGH (ref 65–99)

## 2015-08-20 LAB — PROTIME-INR
INR: 1.85 — ABNORMAL HIGH (ref 0.00–1.49)
Prothrombin Time: 21.3 seconds — ABNORMAL HIGH (ref 11.6–15.2)

## 2015-08-20 MED ORDER — MIRTAZAPINE 15 MG PO TABS
15.0000 mg | ORAL_TABLET | Freq: Every day | ORAL | Status: DC
  Administered 2015-08-20 – 2015-08-22 (×3): 15 mg via ORAL
  Filled 2015-08-20 (×5): qty 1

## 2015-08-20 MED ORDER — WARFARIN SODIUM 7.5 MG PO TABS
7.5000 mg | ORAL_TABLET | Freq: Once | ORAL | Status: AC
  Administered 2015-08-20: 7.5 mg via ORAL
  Filled 2015-08-20: qty 1

## 2015-08-20 NOTE — Progress Notes (Signed)
ANTICOAGULATION CONSULT NOTE - Follow Up Consult  Pharmacy Consult for Coumadin Indication: left ventricular noncompaction   Allergies  Allergen Reactions  . Metformin And Related Other (See Comments)    Pt reports severe reaction to this and other provider took him off this med    Patient Measurements: Height: '5\' 10"'$  (177.8 cm) Weight: 143 lb (64.864 kg) IBW/kg (Calculated) : 73    Labs:  Recent Labs  08/18/15 1825 08/19/15 0614 08/20/15 0637  LABPROT 18.5* 17.1* 21.3*  INR 1.53* 1.38 1.85*    Estimated Creatinine Clearance: 60.8 mL/min (by C-G formula based on Cr of 1.29).   Medications:  Scheduled:  . atorvastatin  20 mg Oral q1800  . digoxin  0.25 mg Oral Daily  . feeding supplement (GLUCERNA SHAKE)  237 mL Oral TID BM  . furosemide  40 mg Oral QODAY  . insulin aspart  0-5 Units Subcutaneous QHS  . insulin aspart  0-9 Units Subcutaneous TID WC  . insulin glargine  30 Units Subcutaneous Q2200  . losartan  12.5 mg Oral Daily  . multivitamin with minerals  1 tablet Oral Daily  . PARoxetine  20 mg Oral QHS  . spironolactone  12.5 mg Oral Daily  . thiamine  100 mg Oral Daily  . warfarin  7.5 mg Oral ONCE-1800  . Warfarin - Pharmacist Dosing Inpatient   Does not apply q1800    Assessment: INR below goal at 1.85 today. No problems with therapy noted.   Goal of Therapy:  INR 2-3    Plan:  Coumadin 7.5 mg x1 today  PT/INR in am    Lenox Ponds 08/20/2015,8:03 AM

## 2015-08-20 NOTE — Progress Notes (Signed)
[facility] MD Progress Note  08/20/2015 6:14 PM Danon Lograsso  MRN:  [id number] Subjective:  Loyde states he was at [facility] and his blood sugar was out to control so he was sent for glucose stabilization  and then to return to [facility]. He states he really needs help with his addiction. He  has not been able to stop. He states other than diabetes he has heart problems and he knows he is going to end up dying if he was not to get some help. He has been very sedated today as he was given a first Trazodone late last night and repeated early this AM. It kicked it hours after the last dose and now he cant shake it off Principal Problem: MDD (major depressive disorder), recurrent severe, without psychosis ([place]) Diagnosis:   Patient Active Problem List   Diagnosis Date Noted  . Alcohol abuse with alcohol-induced mood disorder ([place]) [F10.14] 08/18/2015  . MDD (major depressive disorder), recurrent severe, without psychosis ([place]) [F33.2] 08/18/2015  . Substance induced mood disorder ([place]) [F19.94] 08/17/2015  . Psychoactive substance-induced mood disorder ([place]) [T55.73, F06.30] 07/17/2015  . Open wound of foot [S91.309A] 02/27/2015  . Acute CVA (cerebrovascular accident) ([place]) [I63.9] 02/07/2015  . HLD (hyperlipidemia) [E78.5]   . Status post PICC central line placement [Z95.828]   . History of ischemic right MCA stroke [Z86.73]   . Type 2 diabetes mellitus with complication (HCC) [U20.2]   . Acute on chronic systolic congestive heart failure ([place]) [I50.23]   . Stroke [facility]) [I63.9]   . Stroke with cerebral ischemia ([place]) [I63.9]   . CVA (cerebral infarction) [I63.9] 01/26/2015  . Left-sided weakness [M62.89]   . Embolic stroke involving right middle cerebral artery ([place]) [I63.411]   . Cardiac LV ejection fraction 10-20% [R94.30]   . 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[place]) [I42.8]   . Acute systolic heart failure ([place]) [I50.21] 01/24/2015  . Acute ischemic stroke ([place]) [I63.9] 01/24/2015  . Cocaine abuse [F14.10]  01/24/2015  . H/O noncompliance with medical treatment, presenting hazards to health [Z91.19] 01/24/2015  . Tobacco abuse [Z72.0] 01/24/2015  . Essential hypertension [I10] 01/24/2015  . Hypotension [I95.9] 01/24/2015  . Hyperlipidemia [E78.5] 01/24/2015  . Dysphagia following cerebral infarction [I69.391] 01/24/2015  . Hemiplegia affecting left nondominant side ([place]) [G81.94] 01/24/2015  . Diabetes mellitus type 2, uncontrolled, with complications ([place]) [R42.7, E11.65] 01/24/2015  . Hyperlipidemia associated with type 2 diabetes mellitus ([place]) [E11.69, E78.5] 11/30/2014  . CAD (coronary artery disease) [I25.10] 11/16/2014  . Acute on chronic systolic CHF (congestive heart failure) ([place]) [I50.23] 09/21/2014  . Chest pain on breathing [R07.1] 09/21/2014  . Chronic systolic heart failure ([place]) [I50.22] 06/20/2014  . Acute on chronic systolic heart failure ([place]) [I50.23]   . Automatic implantable cardioverter-defibrillator in situ [Z95.810]   . 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[place]) [I42.8] 05/27/2014  . Dyspnea [R06.00] 05/26/2014  . Chest discomfort [R07.89] 08/09/2013  . Long term (current) use of anticoagulants [Z79.01] 08/09/2013  . Drug abuse and dependence ([place]) [F19.20]   . CHF (congestive heart failure) ([place]) [I50.9]   . BP (high blood pressure) [I10] 03/04/2013  . Cardiomyopathy, ischemic [I25.5] 07/21/2011  . Type II diabetes mellitus with ophthalmic manifestations ([place]) [E11.39] 09/13/2007  . TOBACCO ABUSE [F17.200] 09/13/2007  . WEIGHT LOSS, ABNORMAL [R63.4] 09/13/2007  . SOB [R06.02] 09/13/2007   Total Time spent with patient: 30 minutes  Past Psychiatric History: see admission H and P  Past Medical History:  Past Medical History  Diagnosis Date  . Chronic systolic heart failure (  [place])     a) Mixed ICM/NICM b) RHC (05/2014): RA 2, RV 19/2/3, PA 22/14 (18), PCWP 6, Fick CO/CI: 5.2 / 2.7, PVR 2.3 WU, PA 60% and 64% c) ECHO (05/2014): EF 20-25%, diff HK, akinesis  entireanteroseptal myocardium, triv AI, mod MR, LA mod/sev dilated  . Automatic implantable cardioverter-defibrillator in situ   . Heart murmur   . Sleep apnea     "cleared after T&A"  . 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[place])   . Ischemic cardiomyopathy     a) Coronary angiography (12/2008) at [facility]: Lmain: nl, LAD mid 100% stenosis with left to left and right-to-left collaterals to the distal LAD; Lcx: nl, RCA nl.    . Type II diabetes mellitus ([place])   . High cholesterol   . Pneumonia 1988  . Drug abuse and dependence ([place])   . CHF (congestive heart failure) [facility])     Past Surgical History  Procedure Laterality Date  . Forearm fracture surgery Left 1980  . Cardiac defibrillator placement  03/2011  . Cholecystectomy  1994  . Right heart catheterization N/A 05/30/2014    Procedure: RIGHT HEART CATH;  Surgeon: Jolaine Artist, MD;  [facility] C[facility] LAB;  Service: Cardiovascular;  Laterality: N/A;  . Fracture surgery    . Cardiac catheterization  05/2014  . Tonsillectomy and adenoidectomy  ~ 1998  . Right heart catheterization N/A 02/07/2015    Procedure: RIGHT HEART CATH;  Surgeon: Jolaine Artist, MD;  Location: [facility] CATH LAB;  Service: Cardiovascular;  Laterality: N/A;   Family History:  Family History  Problem Relation Age of Onset  . Diabetes Mother   . Heart disease Mother   . Diabetes Father   . Prostate cancer Father   . Heart disease Father   . Diabetes Brother   . Diabetes Brother    Family Psychiatric  History: see admission H and P Social History:  History  Alcohol Use  . 0.0 oz/week  . 0 Standard drinks or equivalent per week    Comment: 09/21/2014 "might have a drink q 6 /months, if that"     History  Drug Use  . Yes  . Special: Cocaine    Comment: 09/21/2014 "last cocaine ~ 1 1/2 months ago"    Social History   Social History  . Marital Status: Single    Spouse Name: N/A  . Number of Children: 1  . Years of Education: 14   Occupational History   . Disabled    Social History Main Topics  . Smoking status: Current Some Day Smoker -- 0.25 packs/day for 31 years    Types: Cigarettes  . Smokeless tobacco: Never Used  . Alcohol Use: 0.0 oz/week    0 Standard drinks or equivalent per week     Comment: 09/21/2014 "might have a drink q 6 /months, if that"  . Drug Use: Yes    Special: Cocaine     Comment: 09/21/2014 "last cocaine ~ 1 1/2 months ago"  . Sexual Activity: Yes   Other Topics Concern  . None   Social History Narrative   Lives in [place] with his mother. No pets. Fun: movies, sports, daughter.    Denies religious beliefs that would effect health care.    Additional Social History:    Pain Medications: None Prescriptions: See PTA medication list Over the Counter: See PTA medication list History of alcohol / drug use?: Yes Longest period of sobriety (when/how long): 4 months in 2008 Negative Consequences of Use: Personal  relationships, Work / School Onset of Seizures: Withdrawal Date of most recent seizure: "at the beginning of this year (2016)" Name of Substance 1: ETOH (beer & liquor) 1 - Age of First Use: 54 years of age 60 - Amount (size/oz): Pint of liquor & a 12 pack of beer 1 - Frequency: Daily 1 - Duration: Last 5 years 1 - Last Use / Amount: 08/13/15 Name of Substance 2: Crack cocaine 2 - Age of First Use: In his 20's 2 - Amount (size/oz): About an ounce in the last 3 days 2 - Frequency: Daily 2 - Duration: Last three days.  Usually 4-5 days in a week 2 - Last Use / Amount: 08/13/15                Sleep: Poor  Appetite:  Poor  Current Medications: Current Facility-Administered Medications  Medication Dose Route Frequency Provider Last Rate Last Dose  . acetaminophen (TYLENOL) tablet 650 mg  650 mg Oral Q6H PRN Harriet Butte, NP      . alum & mag hydroxide-simeth (MAALOX/MYLANTA) 200-200-20 MG/5ML suspension 30 mL  30 mL Oral Q4H PRN Harriet Butte, NP      . atorvastatin (LIPITOR)  tablet 20 mg  20 mg Oral q1800 Harriet Butte, NP   20 mg at 08/20/15 1809  . digoxin (LANOXIN) tablet 0.25 mg  0.25 mg Oral Daily Harriet Butte, NP   0.25 mg at 08/20/15 0836  . feeding supplement (GLUCERNA SHAKE) (GLUCERNA SHAKE) liquid 237 mL  237 mL Oral TID BM Clayton Bibles, RD   237 mL at 08/20/15 1400  . furosemide (LASIX) tablet 40 mg  40 mg Oral QODAY Harriet Butte, NP   40 mg at 08/20/15 1028  . hydrOXYzine (ATARAX/VISTARIL) tablet 25 mg  25 mg Oral Q6H PRN Charlcie Cradle, MD   25 mg at 08/19/15 2150  . insulin aspart (novoLOG) injection 0-5 Units  0-5 Units Subcutaneous QHS Harriet Butte, NP   5 Units at 08/17/15 2319  . insulin aspart (novoLOG) injection 0-9 Units  0-9 Units Subcutaneous TID WC Harriet Butte, NP   3 Units at 08/20/15 1718  . insulin glargine (LANTUS) injection 30 Units  30 Units Subcutaneous Q2200 Harriet Butte, NP   30 Units at 08/19/15 2151  . loperamide (IMODIUM) capsule 2-4 mg  2-4 mg Oral PRN Charlcie Cradle, MD      . LORazepam (ATIVAN) tablet 1 mg  1 mg Oral Q6H PRN Charlcie Cradle, MD   1 mg at 08/19/15 2150  . losartan (COZAAR) tablet 12.5 mg  12.5 mg Oral Daily Harriet Butte, NP   12.5 mg at 08/20/15 0837  . magnesium hydroxide (MILK OF MAGNESIA) suspension 30 mL  30 mL Oral Daily PRN Harriet Butte, NP      . mirtazapine (REMERON) tablet 15 mg  15 mg Oral QHS Nicholaus Bloom, MD      . multivitamin with minerals tablet 1 tablet  1 tablet Oral Daily Charlcie Cradle, MD   1 tablet at 08/20/15 0836  . ondansetron (ZOFRAN-ODT) disintegrating tablet 4 mg  4 mg Oral Q6H PRN Charlcie Cradle, MD      . PARoxetine (PAXIL) tablet 20 mg  20 mg Oral QHS Charlcie Cradle, MD   20 mg at 08/20/15 0001  . spironolactone (ALDACTONE) tablet 12.5 mg  12.5 mg Oral Daily Harriet Butte, NP   12.5 mg at 08/20/15 0836  . thiamine (VITAMIN B-1) tablet  100 mg  100 mg Oral Daily Charlcie Cradle, MD   100 mg at 08/20/15 0837  . Warfarin - Pharmacist Dosing Inpatient   Does not  apply [place]        Lab Results:  Results for orders placed or performed during the hospital encounter of 08/17/15 (from the past 48 hour(s))  Protime-INR     Status: Abnormal   Collection Time: 08/18/15  6:25 PM  Result Value Ref Range   Prothrombin Time 18.5 (H) 11.6 - 15.2 seconds   INR 1.53 (H) 0.00 - 1.49    Comment: Performed at [facility]  Glucose, capillary     Status: Abnormal   Collection Time: 08/18/15  9:06 PM  Result Value Ref Range   Glucose-Capillary 127 (H) 65 - 99 mg/dL   Comment 1 Notify RN    Comment 2 Document in Chart   Protime-INR     Status: Abnormal   Collection Time: 08/19/15  6:14 AM  Result Value Ref Range   Prothrombin Time 17.1 (H) 11.6 - 15.2 seconds   INR 1.38 0.00 - 1.49    Comment: Performed at [facility]  Glucose, capillary     Status: Abnormal   Collection Time: 08/19/15  7:16 AM  Result Value Ref Range   Glucose-Capillary 232 (H) 65 - 99 mg/dL  Glucose, capillary     Status: Abnormal   Collection Time: 08/19/15 12:46 PM  Result Value Ref Range   Glucose-Capillary 139 (H) 65 - 99 mg/dL   Comment 1 Notify RN    Comment 2 Document in Chart   Glucose, capillary     Status: Abnormal   Collection Time: 08/19/15  6:15 PM  Result Value Ref Range   Glucose-Capillary 310 (H) 65 - 99 mg/dL   Comment 1 Notify RN    Comment 2 Document in Chart   Glucose, capillary     Status: Abnormal   Collection Time: 08/19/15 10:26 PM  Result Value Ref Range   Glucose-Capillary 158 (H) 65 - 99 mg/dL   Comment 1 Notify RN   Protime-INR     Status: Abnormal   Collection Time: 08/20/15  6:37 AM  Result Value Ref Range   Prothrombin Time 21.3 (H) 11.6 - 15.2 seconds   INR 1.85 (H) 0.00 - 1.49    Comment: Performed at [facility]  Glucose, capillary     Status: Abnormal   Collection Time: 08/20/15  6:40 AM  Result Value Ref Range   Glucose-Capillary 113 (H) 65 - 99 mg/dL  Glucose,  capillary     Status: Abnormal   Collection Time: 08/20/15 11:38 AM  Result Value Ref Range   Glucose-Capillary 251 (H) 65 - 99 mg/dL   Comment 1 Notify RN   Glucose, capillary     Status: Abnormal   Collection Time: 08/20/15  5:12 PM  Result Value Ref Range   Glucose-Capillary 243 (H) 65 - 99 mg/dL    Physical Findings: AIMS: Facial and Oral Movements Muscles of Facial Expression: None, normal Lips and Perioral Area: None, normal Jaw: None, normal Tongue: None, normal,Extremity Movements Upper (arms, wrists, hands, fingers): None, normal Lower (legs, knees, ankles, toes): None, normal, Trunk Movements Neck, shoulders, hips: None, normal, Overall Severity Severity of abnormal movements (highest score from questions above): None, normal Incapacitation due to abnormal movements: None, normal Patient's awareness of abnormal movements (rate only patient's report): No Awareness, Dental Status Current problems with teeth  and/or dentures?: No Does patient usually wear dentures?: No  CIWA:  CIWA-Ar Total: 2 COWS:  COWS Total Score: 3  Musculoskeletal: Strength & Muscle Tone: within normal limits Gait & Station: normal Patient leans: normal  Psychiatric Specialty Exam: Review of Systems  Constitutional: Positive for malaise/fatigue.  HENT: Negative.   Eyes: Negative.   Respiratory: Negative.   Cardiovascular: Negative.   Gastrointestinal: Negative.   Genitourinary: Negative.   Musculoskeletal: Negative.   Skin: Negative.   Neurological: Positive for weakness.  Endo/Heme/Allergies: Negative.   Psychiatric/Behavioral: Positive for depression and substance abuse. The patient is nervous/anxious and has insomnia.     Blood pressure 90/52, pulse 56, temperature 97.7 F (36.5 C), temperature source Oral, resp. rate 16, height '5\' 10"'$  (1.778 m), weight 64.864 kg (143 lb), SpO2 100 %.Body mass index is 20.52 kg/(m^2).  General Appearance: Disheveled  Eye Sport and exercise psychologist::  Fair  Speech:   Clear and Coherent  Volume:  Decreased  Mood:  Anxious and Depressed  Affect:  Restricted  Thought Process:  Coherent and Goal Directed  Orientation:  Full (Time, Place, and Person)  Thought Content:  symptoms events worries concerns  Suicidal Thoughts:  No  Homicidal Thoughts:  No  Memory:  Immediate;   Fair Recent;   Fair Remote;   Fair  Judgement:  Fair  Insight:  Present  Psychomotor Activity:  Decreased  Concentration:  Fair  Recall:  [employer] of Knowledge:Fair  Language: Fair  Akathisia:  No  Handed:  Right  AIMS (if indicated):     Assets:  Desire for Improvement  ADL's:  Intact  Cognition: WNL  Sleep:  Number of Hours: 5   Treatment Plan Summary: Daily contact with patient to assess and evaluate symptoms and progress in treatment and Medication management Supportive approach/coping skills Cocaine dependence; work a relapse prevention plan Diabetes mellitus; optimize glucose control Depression/anxiety; continue the Paxil 20 mg daily Insomnia; will D/C the Trazodone and try Remeron 15 mg HS Work with CBT/mindfulness Coordinate transfer back to [facility] when stable LUGO,IRVING A 08/20/2015, 6:14 PM

## 2015-08-20 NOTE — BHH Group Notes (Signed)
[facility] LCSW Aftercare Discharge Planning Group Note  08/20/2015  8:45 AM  Participation Quality: Did Not Attend. Patient invited to participate but declined.  Tilden Fossa, MSW, [place] Worker [facility] [phone]

## 2015-08-20 NOTE — Plan of Care (Signed)
Problem: Alteration in mood & ability to function due to Goal: STG-Patient will attend groups Outcome: Progressing Patient attended [place] group on 08/19/15.

## 2015-08-20 NOTE — Tx Team (Signed)
Interdisciplinary Treatment Plan Update (Adult) Date: 08/20/2015   Time Reviewed: 9:30 AM  Progress in Treatment: Attending groups: No Participating in groups: No Taking medication as prescribed: Yes Tolerating medication: Yes Family/Significant other contact made: No, patient has declined collateral contact Patient understands diagnosis: Yes Discussing patient identified problems/goals with staff: Yes Medical problems stabilized or resolved: Yes Denies suicidal/homicidal ideation: Yes Issues/concerns per patient self-inventory: Yes Other:  New problem(s) identified: N/A  Discharge Plan or Barriers: Patient interested in [place].    Reason for Continuation of Hospitalization:  Depression Anxiety Medication Stabilization   Comments: N/A  Estimated length of stay: 2-3 days   Dennis [name] is a 54yo male hospitalized with worsening depression and suicidal ideations with a plan to jump in front of a train. Is severely isolative, has disability for medical issues including congestive heart failure. Has a long hx of substance abuse, reporting that he uses $50 worth of crack cocaine and a pint of EOTH on a near-daily basis since 2012. He has a hx of seizures, with the most recent one being in 10/2014. He went to [facility] years ago and then to an [place], was sober for 4 years, would like to try to get into [facility] again. Has no mental health providers. The patient would benefit from safety monitoring, medication evaluation, psychoeducation, group therapy, and discharge planning to link with ongoing resources. The patient refused referral to [facility] for smoking cessation. The Discharge Process and Patient Involvement form was reviewed, signed and placed in the paper chart. Suicide Prevention Education was reviewed thoroughly, and a brochure left with patient. The patient refused consent for SPE to be provided to anyone.     Review of initial/current patient goals per problem list:  1.  Goal(s): Patient will participate in aftercare plan   Met: No   Target date: 3-5 days post admission date   As evidenced by: Patient will participate within aftercare plan AEB aftercare provider and housing plan at discharge being identified.  11/7: Goal not met: CSW assessing for appropriate referrals for pt and will have follow up secured prior to d/c.     2. Goal (s): Patient will exhibit decreased depressive symptoms and suicidal ideations.   Met: Goal progressing   Target date: 3-5 days post admission date   As evidenced by: Patient will utilize self rating of depression at 3 or below and demonstrate decreased signs of depression or be deemed stable for discharge by MD.   11/7: Goal not met: Pt presents with flat affect and depressed mood.  Pt admitted with depression rating of 10.  Pt to show decreased sign of depression and a rating of 3 or less before d/c.     3. Goal(s): Patient will demonstrate decreased signs and symptoms of anxiety.   Met: Goal progressing   Target date: 3-5 days post admission date   As evidenced by: Patient will utilize self rating of anxiety at 3 or below and demonstrated decreased signs of anxiety, or be deemed stable for discharge by MD   11/7: Goal not met: Pt presents with anxious mood and affect.  Pt admitted with anxiety rating of 10.  Pt to show decreased sign of anxiety and a rating of 3 or less before d/c.      4. Goal(s): Patient will demonstrate decreased signs of withdrawal due to substance abuse   Met: Yes   Target date: 3-5 days post admission date   As evidenced by: Patient will produce a CIWA/COWS score of 0,  have stable vitals signs, and no symptoms of withdrawal   11/7: Goal met: No withdrawal symptoms reported at this time per medical chart.      Attendees: Patient:    Family:    Physician: Dr. Parke Poisson; Dr. Sabra Heck 08/20/2015 9:30 AM  Nursing: Mayra Neer, Loletta Specter, Velta Addison, RN 08/20/2015 9:30 AM  Clinical Social Worker: Tilden Fossa, LCSWA 08/20/2015 9:30 AM  Other: Peri Maris, LCSWA, Heather Smart, LCSW 08/20/2015 9:30 AM  Other: Marijo Sanes Liaison 08/20/2015 9:30 AM  Other: Lars Pinks, Case Manager 08/20/2015 9:30 AM  Other: Catalina Pizza , NP 08/20/2015 9:30 AM  Other: Norberto Sorenson, [facility]          Scribe for Treatment Team:  Tilden Fossa, MSW, [place]

## 2015-08-20 NOTE — Progress Notes (Signed)
Pt attended evening AA group. 

## 2015-08-20 NOTE — Progress Notes (Signed)
Recreation Therapy Notes  Date: 11.07.2016 Time: 9:30am Location: [address] Dayroom  Group Topic: Stress Management  Goal Area(s) Addresses:  Patient will actively participate in stress management techniques presented during session.   Behavioral Response: Did not attend.   Laureen Ochs Blanchfield, LRT/CTRS        Blanchfield, Denise L 08/20/2015 10:26 AM

## 2015-08-20 NOTE — Progress Notes (Addendum)
D: Patient in the hallway on approach.  Patient states he had a good day.  Patient states he wants to go to [facility] when discharged.  Patient states he also wants to get his life together.  Patient states he is passive SI but verbally contracts for safety.  Patient states he is worried about not sleeping tonight. Patient denies HI and denies AVH. A: Staff to monitor Q 15 mins for safety.  Encouragement and support offered.  Scheduled medications administered per orders.   R: Patient remains safe on the unit.  Patient attended group tonight.  Patient visible on the unit and interacting with peers. Patient taking administered medications.

## 2015-08-20 NOTE — Progress Notes (Signed)
Patient ID: Dennis [name], male   DOB: [birth year], 54 y.o.   MRN: [id number]   Pt currently presents with a flat affect and euthymic behavior. Per self inventory, pt rates depression at a 8, hopelessness 8 and anxiety 7. Pt's daily goal is to "getting into trreatment" and they intend to do so by "talk." Pt reports fair sleep, a good appetite, low energy and poor concentration. Pt sedated this morning, falling asleep while Probation officer talks to him. Pt remained in bed until 1215. Pt also tremulous today, denies any other physical symptoms.   Pt provided with medications per providers orders. Pt's labs and vitals were monitored throughout the day. Pt experienced somnolence and hypotension today. Provider notified. Per orders; push fluids, change in nighttime medication, continue to monitor vitals. Pt supported emotionally and encouraged to express concerns and questions. Pt educated on medications.  Pt's safety ensured with 15 minute and environmental checks. Pt currently denies SI/HI and A/V hallucinations. Pt verbally agrees to seek staff if SI/HI or A/VH occurs and to consult with staff before acting on these thoughts. Will continue POC.

## 2015-08-21 DIAGNOSIS — E118 Type 2 diabetes mellitus with unspecified complications: Secondary | ICD-10-CM

## 2015-08-21 DIAGNOSIS — E1165 Type 2 diabetes mellitus with hyperglycemia: Secondary | ICD-10-CM

## 2015-08-21 DIAGNOSIS — Z794 Long term (current) use of insulin: Secondary | ICD-10-CM

## 2015-08-21 LAB — GLUCOSE, CAPILLARY
Glucose-Capillary: 69 mg/dL (ref 65–99)
Glucose-Capillary: 90 mg/dL (ref 65–99)
Glucose-Capillary: 163 mg/dL — ABNORMAL HIGH (ref 65–99)
Glucose-Capillary: 209 mg/dL — ABNORMAL HIGH (ref 65–99)
Glucose-Capillary: 190 mg/dL — ABNORMAL HIGH (ref 65–99)
Glucose-Capillary: 242 mg/dL — ABNORMAL HIGH (ref 65–99)

## 2015-08-21 LAB — PROTIME-INR
INR: 2.49 — ABNORMAL HIGH (ref 0.00–1.49)
Prothrombin Time: 26.6 seconds — ABNORMAL HIGH (ref 11.6–15.2)

## 2015-08-21 MED ORDER — WARFARIN SODIUM 2.5 MG PO TABS
2.5000 mg | ORAL_TABLET | Freq: Once | ORAL | Status: AC
  Administered 2015-08-21: 2.5 mg via ORAL
  Filled 2015-08-21: qty 1

## 2015-08-21 NOTE — Progress Notes (Signed)
D: Patient in the hallway on approach.  Patient states he had a much better day today.  Patient states he is still trying to eat better so he can meet criteria for rehab.  Patient states his goal is to get off and stay off that tuff.  When writer asked patient to elaborate patient states he was talking about drugs.  Patient states he is passive SI but verbally contracts for safety.  Patient denies HI and denies AVH.   A: Staff to monitor Q 15 mins for safety.  Encouragement and support offered.  Scheduled medications administered per orders.   R: Patient remains safe on the unit.  Patient attended group tonight.  Patient visible on the unit and interacting with peers.  Patient taking administered medications.

## 2015-08-21 NOTE — Progress Notes (Signed)
ANTICOAGULATION CONSULT NOTE - Follow Up Consult  Pharmacy Consult for coumadin   Indication: left ventricular noncompaction   Allergies  Allergen Reactions  . Metformin And Related Other (See Comments)    Pt reports severe reaction to this and other provider took him off this med    Patient Measurements: Height: '5\' 10"'$  (177.8 cm) Weight: 143 lb (64.864 kg) IBW/kg (Calculated) : 73   Vital Signs: BP: 91/56 mmHg (11/08 0637) Pulse Rate: 106 (11/08 0637)  Labs:  Recent Labs  08/19/15 0614 08/20/15 0637 08/21/15 0645  LABPROT 17.1* 21.3* 26.6*  INR 1.38 1.85* 2.49*    Estimated Creatinine Clearance: 60.8 mL/min (by C-G formula based on Cr of 1.29).   Medications:  Scheduled:  . atorvastatin  20 mg Oral q1800  . digoxin  0.25 mg Oral Daily  . feeding supplement (GLUCERNA SHAKE)  237 mL Oral TID BM  . furosemide  40 mg Oral QODAY  . insulin aspart  0-5 Units Subcutaneous QHS  . insulin aspart  0-9 Units Subcutaneous TID WC  . insulin glargine  30 Units Subcutaneous Q2200  . losartan  12.5 mg Oral Daily  . mirtazapine  15 mg Oral QHS  . multivitamin with minerals  1 tablet Oral Daily  . PARoxetine  20 mg Oral QHS  . spironolactone  12.5 mg Oral Daily  . thiamine  100 mg Oral Daily  . warfarin  2.5 mg Oral ONCE-1800  . Warfarin - Pharmacist Dosing Inpatient   Does not apply q1800    Assessment: At goal with large increase from 1.85 to 2.49. Coumadin home dose stated as 7.5 mg daily although after 2 7.5 mg doses and one 10 mg dose large increase in INR and on admission INR was 3.91.  Patient will probably require a lower daily dose of coumadin  No problems noted.  Goal of Therapy:  INR 2-3    Plan:  Will give coumadin 2.5 mg x 1 today  PT/INR in am   Lenox Ponds 08/21/2015,8:48 AM

## 2015-08-21 NOTE — Progress Notes (Signed)
Medication regimen: Hypoglycemic Event  CBG: 69  Treatment: 15 GM carbohydrate snack  Symptoms: None  Follow-up CBG: Time: CBG Result:90  Possible Reasons for EventMedication regimen: Lantus 30 units at bedtime  Comments/MD notified:Spencer Simon '@0630'$  repeat cbg in 15 minutes    [place] M

## 2015-08-21 NOTE — H&P (Signed)
PCP:   Mauricio Po, [place]   Reason for consult Management of diabetes mellitus  Consulting physician Dr. Sabra Heck   HPI:  54 year old male who  has a past medical history of Chronic systolic heart failure ([place]); Automatic implantable cardioverter-defibrillator in situ; Heart murmur; Sleep apnea; 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[place]); Ischemic cardiomyopathy; Type II diabetes mellitus ([place]); High cholesterol; Pneumonia (1988); Drug abuse and dependence ([place]); and CHF (congestive heart failure) ([place]). Patient is currently admitted to the behavioral [place] for depression, patient has a history of diabetes mellitus, CHF, CVA , CAD, CHF EF 10-15%, on chronic anticoagulation with Coumadin. Patient takes Lantus 30 units daily, but says that he has not been compliant with taking Lantus every day at home as he ran out a few days ago. Patient says his blood sugar at home has not been controlled and has been running in high 200s. At [facility] patient has been started on Lantus 30 units subcutaneous daily along with sliding scale insulin. Blood glucose has been well controlled. The  patient has also been on warfarin managed per pharmacy. INR is therapeutic. He denies any symptoms. No chest pain or shortness of breath. No nausea vomiting or diarrhea. No fever no dysuria urgency frequency of urination.    Allergies:   Allergies  Allergen Reactions  . Metformin And Related Other (See Comments)    Pt reports severe reaction to this and other provider took him off this med      Past Medical History  Diagnosis Date  . Chronic systolic heart failure (HCC)     a) Mixed ICM/NICM b) RHC (05/2014): RA 2, RV 19/2/3, PA 22/14 (18), PCWP 6, Fick CO/CI: 5.2 / 2.7, PVR 2.3 WU, PA 60% and 64% c) ECHO (05/2014): EF 20-25%, diff HK, akinesis entireanteroseptal myocardium, triv AI, mod MR, LA mod/sev dilated  . Automatic implantable cardioverter-defibrillator in situ   . Heart murmur   . Sleep apnea     "cleared  after T&A"  . 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[place])   . Ischemic cardiomyopathy     a) Coronary angiography (12/2008) at [facility]: Lmain: nl, LAD mid 100% stenosis with left to left and right-to-left collaterals to the distal LAD; Lcx: nl, RCA nl.    . Type II diabetes mellitus ([place])   . High cholesterol   . Pneumonia 1988  . Drug abuse and dependence ([place])   . CHF (congestive heart failure) [facility])     Past Surgical History  Procedure Laterality Date  . Forearm fracture surgery Left 1980  . Cardiac defibrillator placement  03/2011  . Cholecystectomy  1994  . Right heart catheterization N/A 05/30/2014    Procedure: RIGHT HEART CATH;  Surgeon: Jolaine Artist, MD;  Location: [facility] C[facility] LAB;  Service: Cardiovascular;  Laterality: N/A;  . Fracture surgery    . Cardiac catheterization  05/2014  . Tonsillectomy and adenoidectomy  ~ 1998  . Right heart catheterization N/A 02/07/2015    Procedure: RIGHT HEART CATH;  Surgeon: Jolaine Artist, MD;  Location: [facility] CATH LAB;  Service: Cardiovascular;  Laterality: N/A;    Prior to Admission medications   Medication Sig Start Date End Date Taking? Authorizing Provider  atorvastatin (LIPITOR) 20 MG tablet Take 1 tablet (20 mg total) by mouth daily at 6 PM. 02/07/15  Yes Florencia Reasons, MD  furosemide (LASIX) 40 MG tablet Take 40 mg by mouth every other day.   Yes Historical Provider, MD  Insulin Glargine (LANTUS) 100 UNIT/ML Solostar Pen Inject 30 Units  into the skin daily at 10 pm. Dispense pen and needles 02/14/15  Yes Ivan Anchors Love, PA-C  losartan (COZAAR) 25 MG tablet Take 0.5 tablets (12.5 mg total) by mouth daily. 02/28/15  Yes Jolaine Artist, MD  warfarin (COUMADIN) 2 MG tablet TAKE 3 & 1/2 TABLETS BY MOUTH DAILY AT 6PM 08/10/15  Yes Jolaine Artist, MD  warfarin (COUMADIN) 7.5 MG tablet Take as directed by coumadin clinic Patient taking differently: Take 7.5 mg by mouth daily at 6 PM.  03/19/15  Yes Jolaine Artist, MD  digoxin (LANOXIN) 0.25 MG  tablet Take 1 tablet (0.25 mg total) by mouth daily. 08/10/15   Jolaine Artist, MD  furosemide (LASIX) 40 MG tablet TAKE 1 TABLET BY MOUTH DAILY 08/08/15   Jolaine Artist, MD  glucose blood [facility] VERIO) test strip Use as instructed 11/30/14   [place], FNP  hydrocerin (EUCERIN) CREA Apply 1 application topically 2 (two) times daily. Patient not taking: Reported on 08/16/2015 02/13/15   Ivan Anchors Love, PA-C  insulin aspart (NOVOLOG FLEXPEN) 100 UNIT/ML FlexPen Use as directed. Patient not taking: Reported on 07/16/2015 02/14/15   Bary Leriche, PA-C  QC PEN NEEDLES 31G X 6 MM MISC 1 each by Other route See admin instructions. Use daily with insulin. 08/06/12   Historical Provider, MD  spironolactone (ALDACTONE) 25 MG tablet Take 0.5 tablets (12.5 mg total) by mouth daily. 02/13/15   Bary Leriche, PA-C    Social History:  reports that he has been smoking Cigarettes.  He has a 7.75 pack-year smoking history. He has never used smokeless tobacco. He reports that he drinks alcohol. He reports that he uses illicit drugs (Cocaine).  Family History  Problem Relation Age of Onset  . Diabetes Mother   . Heart disease Mother   . Diabetes Father   . Prostate cancer Father   . Heart disease Father   . Diabetes Brother   . Diabetes Brother     Danley Danker Weights   08/17/15 2233  Weight: 64.864 kg (143 lb)    All the positives are listed in BOLD  Review of Systems:  HEENT: Headache, blurred vision, runny nose, sore throat Neck: Hypothyroidism, hyperthyroidism,,lymphadenopathy Chest : Shortness of breath, history of COPD, Asthma Heart : Chest pain, history of coronary arterey disease GI:  Nausea, vomiting, diarrhea, constipation, GERD GU: Dysuria, urgency, frequency of urination, hematuria Neuro: Stroke, seizures, syncope Psych: Depression, anxiety, hallucinations   Physical Exam: Blood pressure 91/56, pulse 106, temperature 97.7 F (36.5 C), temperature source Oral, resp. rate 16,  height '5\' 10"'$  (1.778 m), weight 64.864 kg (143 lb), SpO2 100 %. Constitutional:   Patient is a well-developed and well-nourished male* in no acute distress and cooperative with exam. Head: Normocephalic and atraumatic Mouth: Mucus membranes moist Eyes: PERRL, EOMI, conjunctivae normal Neck: Supple, No Thyromegaly Cardiovascular: RRR, S1 normal, S2 normal Pulmonary/Chest: CTAB, no wheezes, rales, or rhonchi Abdominal: Soft. Non-tender, non-distended, bowel sounds are normal, no masses, organomegaly, or guarding present.  Neurological: A&O x3, Strength is normal and symmetric bilaterally, cranial nerve II-XII are grossly intact, no focal motor deficit, sensory intact to light touch bilaterally.  Extremities : No Cyanosis, Clubbing or Edema  Labs on Admission:  Basic Metabolic Panel:  Recent Labs Lab 08/16/15 1959  NA 136  K 3.7  CL 97*  CO2 27  GLUCOSE 369*  BUN 12  CREATININE 1.29*  CALCIUM 9.8   Liver Function Tests:  Recent Labs Lab 08/16/15 1959  AST 21  ALT 14*  ALKPHOS 96  BILITOT 0.7  PROT 6.9  ALBUMIN 3.9   No results for input(s): LIPASE, AMYLASE in the last 168 hours. No results for input(s): AMMONIA in the last 168 hours. CBC:  Recent Labs Lab 08/16/15 1959  WBC 12.2*  HGB 15.3  HCT 45.4  MCV 91.5  PLT 201   Cardiac Enzymes: No results for input(s): CKTOTAL, CKMB, CKMBINDEX, TROPONINI in the last 168 hours.  BNP (last 3 results)  Recent Labs  12/03/14 0351 01/24/15 2005 06/22/15 0030  BNP 321.1* 166.1* 252.4*    ProBNP (last 3 results)  Recent Labs  09/21/14 1928 10/02/14 1033  PROBNP 1870.0* 1085.0*    CBG:  Recent Labs Lab 08/20/15 1712 08/20/15 2123 08/21/15 0618 08/21/15 0641 08/21/15 0743  GLUCAP 243* 254* 69 90 163*    Radiological Exams on Admission: No results found.    Assessment/Plan Principal Problem:   MDD (major depressive disorder), recurrent severe, without psychosis ([place]) Active Problems:    Diabetes mellitus type 2, uncontrolled, with complications ([place])   Substance induced mood disorder ([place])   Alcohol abuse with alcohol-induced mood disorder ([place])   Cocaine abuse with cocaine-induced mood disorder ([place])  Diabetes mellitus We'll check hemoglobin A1c, continue Lantus 30 units subcutaneous daily. Continue sliding scale insulin with NovoLog.  Congestive heart failure, EF 10-15% Status post AICD Continue Coumadin, Lasix. Will check BMP in a.m.   Depression Management per psychiatry  Will follow the patient along with you.  Time Spent  60 min  LAMA,GAGAN S [facility] Pager: [phone] 08/21/2015, 1:34 PM  If 7PM-7AM, please contact night-coverage  [email or web address]  Password [email or web address]

## 2015-08-21 NOTE — Progress Notes (Addendum)
Patient ID: Dennis [name], male   DOB: 06/08/1961, 54 y.o.   MRN: [id number]   Pt currently presents with a brighter affect and cooperative behavior. Per self inventory, pt rates depression at a 7, hopelessness 7 and anxiety 6. Pt's daily goal is "my hopeless feelings" and they intend to do so by "try to stay positive." Pt reports good sleep, a good appetite and low energy. Pt seen in dayroom, smiling and interacting with other pts. Pt reports the sleep medication he had taken the previous night worked well.   Pt provided with medications per providers orders. Pt's labs and vitals were monitored throughout the day. Pt supported emotionally and encouraged to express concerns and questions. Pt educated on medications. Provider and MD notified of pts labile CBGs. Consult ordered.   Pt's safety ensured with 15 minute and environmental checks. Pt currently denies HI and A/V hallucinations. Pt verbally agrees to seek staff if HI or A/VH occurs. Pt endorses passive SI that has been "about the same since I've been here." Pt agrees to consult with staff before acting on any harmful thoughts. Hospitalist consulted with pt, orders for AM labs, orders to continue current diabetic medication management regimen. Will continue POC.

## 2015-08-21 NOTE — [facility] Group Notes (Signed)

## 2015-08-21 NOTE — Progress Notes (Signed)
Recreation Therapy Notes  Animal-Assisted Activity (AAA) Program Checklist/Progress Notes Patient Eligibility Criteria Checklist & Daily Group note for Rec Tx Intervention  Date: 11.08.2016 Time: 2:45pm Location: [address]    AAA/T Program Assumption of Risk Form signed by Patient/ or Parent Legal Guardian yes  Patient is free of allergies or sever asthma yes  Patient reports no fear of animals yes  Patient reports no history of cruelty to animals yes  Patient understands his/her participation is voluntary yes  Behavioral Response: Did not attend.    Laureen Ochs Blanchfield, LRT/CTRS        Blanchfield, Denise L 08/21/2015 3:13 PM

## 2015-08-21 NOTE — Progress Notes (Signed)
Pt attended evening AA group. 

## 2015-08-21 NOTE — Progress Notes (Signed)
[facility] MD Progress Note  08/21/2015 7:01 PM Dennis [name]  MRN:  [id number] Subjective:  Dennis [name] is waiting to be able to get his glucose under better control in order to be admitted back to [facility]. He states he really needs a residential treatment program. He is afraid of relapsing given his medical conditions Principal Problem: MDD (major depressive disorder), recurrent severe, without psychosis ([place]) Diagnosis:   Patient Active Problem List   Diagnosis Date Noted  . Cocaine abuse with cocaine-induced mood disorder ([place]) [F14.14] 08/20/2015  . Alcohol abuse with alcohol-induced mood disorder ([place]) [F10.14] 08/18/2015  . MDD (major depressive disorder), recurrent severe, without psychosis ([place]) [F33.2] 08/18/2015  . Substance induced mood disorder ([place]) [F19.94] 08/17/2015  . Psychoactive substance-induced mood disorder ([place]) [W09.81, F06.30] 07/17/2015  . Open wound of foot [S91.309A] 02/27/2015  . Acute CVA (cerebrovascular accident) ([place]) [I63.9] 02/07/2015  . HLD (hyperlipidemia) [E78.5]   . Status post PICC central line placement [Z95.828]   . History of ischemic right MCA stroke [Z86.73]   . Type 2 diabetes mellitus with complication (HCC) [X91.4]   . Acute on chronic systolic congestive heart failure ([place]) [I50.23]   . Stroke [facility]) [I63.9]   . Stroke with cerebral ischemia ([place]) [I63.9]   . CVA (cerebral infarction) [I63.9] 01/26/2015  . Left-sided weakness [M62.89]   . Embolic stroke involving right middle cerebral artery ([place]) [I63.411]   . Cardiac LV ejection fraction 10-20% [R94.30]   . 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[place]) [I42.8]   . Acute systolic heart failure ([place]) [I50.21] 01/24/2015  . Acute ischemic stroke ([place]) [I63.9] 01/24/2015  . Cocaine abuse [F14.10] 01/24/2015  . H/O noncompliance with medical treatment, presenting hazards to health [Z91.19] 01/24/2015  . Tobacco abuse [Z72.0] 01/24/2015  . Essential hypertension [I10] 01/24/2015  . Hypotension [I95.9] 01/24/2015   . Hyperlipidemia [E78.5] 01/24/2015  . Dysphagia following cerebral infarction [I69.391] 01/24/2015  . Hemiplegia affecting left nondominant side ([place]) [G81.94] 01/24/2015  . Diabetes mellitus type 2, uncontrolled, with complications ([place]) [N82.9, E11.65] 01/24/2015  . Hyperlipidemia associated with type 2 diabetes mellitus ([place]) [E11.69, E78.5] 11/30/2014  . CAD (coronary artery disease) [I25.10] 11/16/2014  . Acute on chronic systolic CHF (congestive heart failure) ([place]) [I50.23] 09/21/2014  . Chest pain on breathing [R07.1] 09/21/2014  . Chronic systolic heart failure ([place]) [I50.22] 06/20/2014  . Acute on chronic systolic heart failure ([place]) [I50.23]   . Automatic implantable cardioverter-defibrillator in situ [Z95.810]   . 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[place]) [I42.8] 05/27/2014  . Dyspnea [R06.00] 05/26/2014  . Chest discomfort [R07.89] 08/09/2013  . Long term (current) use of anticoagulants [Z79.01] 08/09/2013  . Drug abuse and dependence ([place]) [F19.20]   . CHF (congestive heart failure) ([place]) [I50.9]   . BP (high blood pressure) [I10] 03/04/2013  . Cardiomyopathy, ischemic [I25.5] 07/21/2011  . Type II diabetes mellitus with ophthalmic manifestations ([place]) [E11.39] 09/13/2007  . TOBACCO ABUSE [F17.200] 09/13/2007  . WEIGHT LOSS, ABNORMAL [R63.4] 09/13/2007  . SOB [R06.02] 09/13/2007   Total Time spent with patient: 20 minutes  Past Psychiatric History: see admission H and P  Past Medical History:  Past Medical History  Diagnosis Date  . Chronic systolic heart failure (HCC)     a) Mixed ICM/NICM b) RHC (05/2014): RA 2, RV 19/2/3, PA 22/14 (18), PCWP 6, Fick CO/CI: 5.2 / 2.7, PVR 2.3 WU, PA 60% and 64% c) ECHO (05/2014): EF 20-25%, diff HK, akinesis entireanteroseptal myocardium, triv AI, mod MR, LA mod/sev dilated  . Automatic implantable cardioverter-defibrillator in situ   .  Heart murmur   . Sleep apnea     "cleared after T&A"  . 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[place])   .  Ischemic cardiomyopathy     a) Coronary angiography (12/2008) at [facility]: Lmain: nl, LAD mid 100% stenosis with left to left and right-to-left collaterals to the distal LAD; Lcx: nl, RCA nl.    . Type II diabetes mellitus ([place])   . High cholesterol   . Pneumonia 1988  . Drug abuse and dependence ([place])   . CHF (congestive heart failure) [facility])     Past Surgical History  Procedure Laterality Date  . Forearm fracture surgery Left 1980  . Cardiac defibrillator placement  03/2011  . Cholecystectomy  1994  . Right heart catheterization N/A 05/30/2014    Procedure: RIGHT HEART CATH;  Surgeon: Jolaine Artist, MD;  [facility] C[facility] LAB;  Service: Cardiovascular;  Laterality: N/A;  . Fracture surgery    . Cardiac catheterization  05/2014  . Tonsillectomy and adenoidectomy  ~ 1998  . Right heart catheterization N/A 02/07/2015    Procedure: RIGHT HEART CATH;  Surgeon: Jolaine Artist, MD;  Location: [facility] CATH LAB;  Service: Cardiovascular;  Laterality: N/A;   Family History:  Family History  Problem Relation Age of Onset  . Diabetes Mother   . Heart disease Mother   . Diabetes Father   . Prostate cancer Father   . Heart disease Father   . Diabetes Brother   . Diabetes Brother    Family Psychiatric  History: see Admission H and P Social History:  History  Alcohol Use  . 0.0 oz/week  . 0 Standard drinks or equivalent per week    Comment: 09/21/2014 "might have a drink q 6 /months, if that"     History  Drug Use  . Yes  . Special: Cocaine    Comment: 09/21/2014 "last cocaine ~ 1 1/2 months ago"    Social History   Social History  . Marital Status: Single    Spouse Name: N/A  . Number of Children: 1  . Years of Education: 14   Occupational History  . Disabled    Social History Main Topics  . Smoking status: Current Some Day Smoker -- 0.25 packs/day for 31 years    Types: Cigarettes  . Smokeless tobacco: Never Used  . Alcohol Use: 0.0 oz/week    0 Standard drinks or  equivalent per week     Comment: 09/21/2014 "might have a drink q 6 /months, if that"  . Drug Use: Yes    Special: Cocaine     Comment: 09/21/2014 "last cocaine ~ 1 1/2 months ago"  . Sexual Activity: Yes   Other Topics Concern  . None   Social History Narrative   Lives in [place] with his mother. No pets. Fun: movies, sports, daughter.    Denies religious beliefs that would effect health care.    Additional Social History:    Pain Medications: None Prescriptions: See PTA medication list Over the Counter: See PTA medication list History of alcohol / drug use?: Yes Longest period of sobriety (when/how long): 4 months in 2008 Negative Consequences of Use: Personal relationships, Work / School Onset of Seizures: Withdrawal Date of most recent seizure: "at the beginning of this year (2016)" Name of Substance 1: ETOH (beer & liquor) 1 - Age of First Use: 54 years of age 66 - Amount (size/oz): Pint of liquor & a 12 pack of beer 1 - Frequency: Daily 1 - Duration: Last  5 years 1 - Last Use / Amount: 08/13/15 Name of Substance 2: Crack cocaine 2 - Age of First Use: In his 20's 2 - Amount (size/oz): About an ounce in the last 3 days 2 - Frequency: Daily 2 - Duration: Last three days.  Usually 4-5 days in a week 2 - Last Use / Amount: 08/13/15                Sleep: Fair  Appetite:  Fair  Current Medications: Current Facility-Administered Medications  Medication Dose Route Frequency Provider Last Rate Last Dose  . acetaminophen (TYLENOL) tablet 650 mg  650 mg Oral Q6H PRN Harriet Butte, NP      . alum & mag hydroxide-simeth (MAALOX/MYLANTA) 200-200-20 MG/5ML suspension 30 mL  30 mL Oral Q4H PRN Harriet Butte, NP      . atorvastatin (LIPITOR) tablet 20 mg  20 mg Oral q1800 Harriet Butte, NP   20 mg at 08/21/15 1813  . digoxin (LANOXIN) tablet 0.25 mg  0.25 mg Oral Daily Harriet Butte, NP   0.25 mg at 08/21/15 0901  . feeding supplement (GLUCERNA SHAKE) (GLUCERNA  SHAKE) liquid 237 mL  237 mL Oral TID BM Clayton Bibles, RD   237 mL at 08/21/15 1400  . furosemide (LASIX) tablet 40 mg  40 mg Oral QODAY Harriet Butte, NP   40 mg at 08/20/15 1028  . insulin aspart (novoLOG) injection 0-5 Units  0-5 Units Subcutaneous QHS Harriet Butte, NP   5 Units at 08/17/15 2319  . insulin aspart (novoLOG) injection 0-9 Units  0-9 Units Subcutaneous TID WC Harriet Butte, NP   2 Units at 08/21/15 1724  . insulin glargine (LANTUS) injection 30 Units  30 Units Subcutaneous Q2200 Harriet Butte, NP   30 Units at 08/20/15 2240  . losartan (COZAAR) tablet 12.5 mg  12.5 mg Oral Daily Harriet Butte, NP   12.5 mg at 08/21/15 0902  . magnesium hydroxide (MILK OF MAGNESIA) suspension 30 mL  30 mL Oral Daily PRN Harriet Butte, NP      . mirtazapine (REMERON) tablet 15 mg  15 mg Oral QHS Nicholaus Bloom, MD   15 mg at 08/20/15 2238  . multivitamin with minerals tablet 1 tablet  1 tablet Oral Daily Charlcie Cradle, MD   1 tablet at 08/21/15 0902  . PARoxetine (PAXIL) tablet 20 mg  20 mg Oral QHS Charlcie Cradle, MD   20 mg at 08/20/15 2238  . spironolactone (ALDACTONE) tablet 12.5 mg  12.5 mg Oral Daily Harriet Butte, NP   12.5 mg at 08/21/15 0902  . thiamine (VITAMIN B-1) tablet 100 mg  100 mg Oral Daily Charlcie Cradle, MD   100 mg at 08/21/15 0902  . Warfarin - Pharmacist Dosing Inpatient   Does not apply q1800 Lynelle Doctor, [facility]        Lab Results:  Results for orders placed or performed during the hospital encounter of 08/17/15 (from the past 48 hour(s))  Glucose, capillary     Status: Abnormal   Collection Time: 08/19/15 10:26 PM  Result Value Ref Range   Glucose-Capillary 158 (H) 65 - 99 mg/dL   Comment 1 Notify RN   Protime-INR     Status: Abnormal   Collection Time: 08/20/15  6:37 AM  Result Value Ref Range   Prothrombin Time 21.3 (H) 11.6 - 15.2 seconds   INR 1.85 (H) 0.00 - 1.49    Comment: Performed  at [facility]  Glucose, capillary     Status:  Abnormal   Collection Time: 08/20/15  6:40 AM  Result Value Ref Range   Glucose-Capillary 113 (H) 65 - 99 mg/dL  Glucose, capillary     Status: Abnormal   Collection Time: 08/20/15 11:38 AM  Result Value Ref Range   Glucose-Capillary 251 (H) 65 - 99 mg/dL   Comment 1 Notify RN   Glucose, capillary     Status: Abnormal   Collection Time: 08/20/15  5:12 PM  Result Value Ref Range   Glucose-Capillary 243 (H) 65 - 99 mg/dL  Glucose, capillary     Status: Abnormal   Collection Time: 08/20/15  9:23 PM  Result Value Ref Range   Glucose-Capillary 254 (H) 65 - 99 mg/dL  Glucose, capillary     Status: None   Collection Time: 08/21/15  6:18 AM  Result Value Ref Range   Glucose-Capillary 69 65 - 99 mg/dL  Glucose, capillary     Status: None   Collection Time: 08/21/15  6:41 AM  Result Value Ref Range   Glucose-Capillary 90 65 - 99 mg/dL  Protime-INR     Status: Abnormal   Collection Time: 08/21/15  6:45 AM  Result Value Ref Range   Prothrombin Time 26.6 (H) 11.6 - 15.2 seconds   INR 2.49 (H) 0.00 - 1.49    Comment: Performed at [facility]  Glucose, capillary     Status: Abnormal   Collection Time: 08/21/15  7:43 AM  Result Value Ref Range   Glucose-Capillary 163 (H) 65 - 99 mg/dL   Comment 1 Notify RN    Comment 2 Document in Chart   Glucose, capillary     Status: Abnormal   Collection Time: 08/21/15  4:55 PM  Result Value Ref Range   Glucose-Capillary 190 (H) 65 - 99 mg/dL   Comment 1 Notify RN    Comment 2 Document in Chart     Physical Findings: AIMS: Facial and Oral Movements Muscles of Facial Expression: None, normal Lips and Perioral Area: None, normal Jaw: None, normal Tongue: None, normal,Extremity Movements Upper (arms, wrists, hands, fingers): None, normal Lower (legs, knees, ankles, toes): None, normal, Trunk Movements Neck, shoulders, hips: None, normal, Overall Severity Severity of abnormal movements (highest score from questions above):  None, normal Incapacitation due to abnormal movements: None, normal Patient's awareness of abnormal movements (rate only patient's report): No Awareness, Dental Status Current problems with teeth and/or dentures?: No Does patient usually wear dentures?: No  CIWA:  CIWA-Ar Total: 1 COWS:  COWS Total Score: 3  Musculoskeletal: Strength & Muscle Tone: within normal limits Gait & Station: normal Patient leans: normal  Psychiatric Specialty Exam: Review of Systems  Constitutional: Negative.   HENT: Negative.   Eyes: Negative.   Respiratory: Negative.   Cardiovascular: Negative.   Gastrointestinal: Negative.   Genitourinary: Negative.   Musculoskeletal: Negative.   Skin: Negative.   Neurological: Negative.   Endo/Heme/Allergies: Negative.   Psychiatric/Behavioral: Positive for depression and substance abuse.    Blood pressure 91/56, pulse 106, temperature 97.7 F (36.5 C), temperature source Oral, resp. rate 16, height '5\' 10"'$  (1.778 m), weight 64.864 kg (143 lb), SpO2 100 %.Body mass index is 20.52 kg/(m^2).  General Appearance: Fairly Groomed  Engineer, water::  Fair  Speech:  Clear and Coherent  Volume:  Decreased  Mood:  Anxious and Depressed  Affect:  Restricted  Thought Process:  Coherent and Goal Directed  Orientation:  Full (  Time, Place, and Person)  Thought Content:  symptoms events worries concerns  Suicidal Thoughts:  No  Homicidal Thoughts:  No  Memory:  Immediate;   Fair Recent;   Fair Remote;   Fair  Judgement:  Fair  Insight:  Present and Shallow  Psychomotor Activity:  Decreased  Concentration:  Fair  Recall:  [employer] of Knowledge:Fair  Language: Fair  Akathisia:  No  Handed:  Right  AIMS (if indicated):     Assets:  Desire for Improvement  ADL's:  Intact  Cognition: WNL  Sleep:  Number of Hours: 5   Treatment Plan Summary: Daily contact with patient to assess and evaluate symptoms and progress in treatment and Medication management Supportive  approach/coping skills Cocaine dependence; continue to work a relapse prevention plan Depression; continue to work with the Paxil 20 mg daily Glucose control; will place a diabetes consult Use CBT/mindfulness LUGO,IRVING A 08/21/2015, 7:01 PM

## 2015-08-21 NOTE — [facility] Group Notes (Signed)
[facility] LCSW Group Therapy  08/21/2015   1:15 PM   Type of Therapy:  Group Therapy  Participation Level:  Active  Participation Quality:  Attentive, Sharing and Supportive  Affect:  Pleasant and Calm  Cognitive:  Alert and Oriented  Insight:  Developing/Improving and Engaged  Engagement in Therapy:  Development/Improving and Engaged  Modes of Intervention:  Clarification, Confrontation, Discussion, Education, Exploration, Limit-setting, Orientation, Problem-solving, Rapport Building, Art therapist, Socialization and Support  Summary of Progress/Problems: The topic for group therapy was feelings about diagnosis.  Pt actively participated in group discussion on their past and current diagnosis and how they feel towards this.  Pt also identified how society and family members judge them, based on their diagnosis as well as stereotypes and stigmas.   Ct. was initially presenting as guarded and withdrawn, but as the session progressed ct. began to engage in group sharing, while sharing with other group members the ct.'s own positive experiences with successfully achieving long periods of abstinence from the use of substances using 12-Step programs as a means of support.  Ct. Was as the session progressed increasingly supportive of the sharing of others.  Tilden Fossa, MSW, [place] Worker [facility] [phone]

## 2015-08-22 LAB — GLUCOSE, CAPILLARY
Glucose-Capillary: 182 mg/dL — ABNORMAL HIGH (ref 65–99)
Glucose-Capillary: 202 mg/dL — ABNORMAL HIGH (ref 65–99)
Glucose-Capillary: 347 mg/dL — ABNORMAL HIGH (ref 65–99)

## 2015-08-22 LAB — BASIC METABOLIC PANEL
Anion gap: 6 (ref 5–15)
BUN: 16 mg/dL (ref 6–20)
CO2: 27 mmol/L (ref 22–32)
Calcium: 9.4 mg/dL (ref 8.9–10.3)
Chloride: 105 mmol/L (ref 101–111)
Creatinine, Ser: 0.82 mg/dL (ref 0.61–1.24)
GFR calc Af Amer: >60 mL/min (ref >60)
GFR calc non Af Amer: >60 mL/min (ref >60)
Glucose, Bld: 199 mg/dL — ABNORMAL HIGH (ref 65–99)
Potassium: 4.2 mmol/L (ref 3.5–5.1)
Sodium: 138 mmol/L (ref 135–145)

## 2015-08-22 LAB — PROTIME-INR
INR: 1.98 — ABNORMAL HIGH (ref 0.00–1.49)
Prothrombin Time: 22.4 seconds — ABNORMAL HIGH (ref 11.6–15.2)

## 2015-08-22 MED ORDER — WARFARIN SODIUM 5 MG PO TABS
5.0000 mg | ORAL_TABLET | Freq: Once | ORAL | Status: AC
  Administered 2015-08-22: 5 mg via ORAL
  Filled 2015-08-22: qty 1

## 2015-08-22 NOTE — Progress Notes (Signed)
[facility] MD Progress Note  08/22/2015 5:43 PM Dennis [name]  MRN:  [id number] Subjective:  Dennis [name] continues to have a hard time. He is worried that he cant get to go a residential treatment program. He states that if he was to be released to day, he would probably be back using. He states he does not want to die and he knows that given his medical conditions he could kill himself if he was to relapse. Principal Problem: MDD (major depressive disorder), recurrent severe, without psychosis ([place]) Diagnosis:   Patient Active Problem List   Diagnosis Date Noted  . Cocaine abuse with cocaine-induced mood disorder ([place]) [F14.14] 08/20/2015  . Alcohol abuse with alcohol-induced mood disorder ([place]) [F10.14] 08/18/2015  . MDD (major depressive disorder), recurrent severe, without psychosis ([place]) [F33.2] 08/18/2015  . Substance induced mood disorder ([place]) [F19.94] 08/17/2015  . Psychoactive substance-induced mood disorder ([place]) [E09.23, F06.30] 07/17/2015  . Open wound of foot [S91.309A] 02/27/2015  . Acute CVA (cerebrovascular accident) ([place]) [I63.9] 02/07/2015  . HLD (hyperlipidemia) [E78.5]   . Status post PICC central line placement [Z95.828]   . History of ischemic right MCA stroke [Z86.73]   . Type 2 diabetes mellitus with complication (HCC) [R00.7]   . Acute on chronic systolic congestive heart failure ([place]) [I50.23]   . Stroke [facility]) [I63.9]   . Stroke with cerebral ischemia ([place]) [I63.9]   . CVA (cerebral infarction) [I63.9] 01/26/2015  . Left-sided weakness [M62.89]   . Embolic stroke involving right middle cerebral artery ([place]) [I63.411]   . Cardiac LV ejection fraction 10-20% [R94.30]   . 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[place]) [I42.8]   . Acute systolic heart failure ([place]) [I50.21] 01/24/2015  . Acute ischemic stroke ([place]) [I63.9] 01/24/2015  . Cocaine abuse [F14.10] 01/24/2015  . H/O noncompliance with medical treatment, presenting hazards to health [Z91.19] 01/24/2015  . Tobacco abuse  [Z72.0] 01/24/2015  . Essential hypertension [I10] 01/24/2015  . Hypotension [I95.9] 01/24/2015  . Hyperlipidemia [E78.5] 01/24/2015  . Dysphagia following cerebral infarction [I69.391] 01/24/2015  . Hemiplegia affecting left nondominant side ([place]) [G81.94] 01/24/2015  . Diabetes mellitus type 2, uncontrolled, with complications ([place]) [M22.6, E11.65] 01/24/2015  . Hyperlipidemia associated with type 2 diabetes mellitus ([place]) [E11.69, E78.5] 11/30/2014  . CAD (coronary artery disease) [I25.10] 11/16/2014  . Acute on chronic systolic CHF (congestive heart failure) ([place]) [I50.23] 09/21/2014  . Chest pain on breathing [R07.1] 09/21/2014  . Chronic systolic heart failure ([place]) [I50.22] 06/20/2014  . Acute on chronic systolic heart failure ([place]) [I50.23]   . Automatic implantable cardioverter-defibrillator in situ [Z95.810]   . 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[place]) [I42.8] 05/27/2014  . Dyspnea [R06.00] 05/26/2014  . Chest discomfort [R07.89] 08/09/2013  . Long term (current) use of anticoagulants [Z79.01] 08/09/2013  . Drug abuse and dependence ([place]) [F19.20]   . CHF (congestive heart failure) ([place]) [I50.9]   . BP (high blood pressure) [I10] 03/04/2013  . Cardiomyopathy, ischemic [I25.5] 07/21/2011  . Type II diabetes mellitus with ophthalmic manifestations ([place]) [E11.39] 09/13/2007  . TOBACCO ABUSE [F17.200] 09/13/2007  . WEIGHT LOSS, ABNORMAL [R63.4] 09/13/2007  . SOB [R06.02] 09/13/2007   Total Time spent with patient: 30 minutes  Past Psychiatric History: see admission H and P  Past Medical History:  Past Medical History  Diagnosis Date  . Chronic systolic heart failure (HCC)     a) Mixed ICM/NICM b) RHC (05/2014): RA 2, RV 19/2/3, PA 22/14 (18), PCWP 6, Fick CO/CI: 5.2 / 2.7, PVR 2.3 WU, PA 60% and 64% c) ECHO (05/2014):  EF 20-25%, diff HK, akinesis entireanteroseptal myocardium, triv AI, mod MR, LA mod/sev dilated  . Automatic implantable cardioverter-defibrillator in situ   .  Heart murmur   . Sleep apnea     "cleared after T&A"  . 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[place])   . Ischemic cardiomyopathy     a) Coronary angiography (12/2008) at [facility]: Lmain: nl, LAD mid 100% stenosis with left to left and right-to-left collaterals to the distal LAD; Lcx: nl, RCA nl.    . Type II diabetes mellitus ([place])   . High cholesterol   . Pneumonia 1988  . Drug abuse and dependence ([place])   . CHF (congestive heart failure) [facility])     Past Surgical History  Procedure Laterality Date  . Forearm fracture surgery Left 1980  . Cardiac defibrillator placement  03/2011  . Cholecystectomy  1994  . Right heart catheterization N/A 05/30/2014    Procedure: RIGHT HEART CATH;  Surgeon: Jolaine Artist, MD;  Location: [facility] CATH LAB;  Service: Cardiovascular;  Laterality: N/A;  . Fracture surgery    . Cardiac catheterization  05/2014  . Tonsillectomy and adenoidectomy  ~ 1998  . Right heart catheterization N/A 02/07/2015    Procedure: RIGHT HEART CATH;  Surgeon: Jolaine Artist, MD;  Location: [facility] CATH LAB;  Service: Cardiovascular;  Laterality: N/A;   Family History:  Family History  Problem Relation Age of Onset  . Diabetes Mother   . Heart disease Mother   . Diabetes Father   . Prostate cancer Father   . Heart disease Father   . Diabetes Brother   . Diabetes Brother    Family Psychiatric  History: see admission H and P Social History:  History  Alcohol Use  . 0.0 oz/week  . 0 Standard drinks or equivalent per week    Comment: 09/21/2014 "might have a drink q 6 /months, if that"     History  Drug Use  . Yes  . Special: Cocaine    Comment: 09/21/2014 "last cocaine ~ 1 1/2 months ago"    Social History   Social History  . Marital Status: Single    Spouse Name: N/A  . Number of Children: 1  . Years of Education: 14   Occupational History  . Disabled    Social History Main Topics  . Smoking status: Current Some Day Smoker -- 0.25 packs/day for 31 years    Types:  Cigarettes  . Smokeless tobacco: Never Used  . Alcohol Use: 0.0 oz/week    0 Standard drinks or equivalent per week     Comment: 09/21/2014 "might have a drink q 6 /months, if that"  . Drug Use: Yes    Special: Cocaine     Comment: 09/21/2014 "last cocaine ~ 1 1/2 months ago"  . Sexual Activity: Yes   Other Topics Concern  . None   Social History Narrative   Lives in [place] with his mother. No pets. Fun: movies, sports, daughter.    Denies religious beliefs that would effect health care.    Additional Social History:    Pain Medications: None Prescriptions: See PTA medication list Over the Counter: See PTA medication list History of alcohol / drug use?: Yes Longest period of sobriety (when/how long): 4 months in 2008 Negative Consequences of Use: Personal relationships, Work / School Onset of Seizures: Withdrawal Date of most recent seizure: "at the beginning of this year (2016)" Name of Substance 1: ETOH (beer & liquor) 1 - Age of First Use: 54  years of age 89 - Amount (size/oz): Pint of liquor & a 12 pack of beer 1 - Frequency: Daily 1 - Duration: Last 5 years 1 - Last Use / Amount: 08/13/15 Name of Substance 2: Crack cocaine 2 - Age of First Use: In his 20's 2 - Amount (size/oz): About an ounce in the last 3 days 2 - Frequency: Daily 2 - Duration: Last three days.  Usually 4-5 days in a week 2 - Last Use / Amount: 08/13/15                Sleep: Fair  Appetite:  Fair  Current Medications: Current Facility-Administered Medications  Medication Dose Route Frequency Provider Last Rate Last Dose  . acetaminophen (TYLENOL) tablet 650 mg  650 mg Oral Q6H PRN Harriet Butte, NP      . alum & mag hydroxide-simeth (MAALOX/MYLANTA) 200-200-20 MG/5ML suspension 30 mL  30 mL Oral Q4H PRN Harriet Butte, NP      . atorvastatin (LIPITOR) tablet 20 mg  20 mg Oral q1800 Harriet Butte, NP   20 mg at 08/22/15 1716  . digoxin (LANOXIN) tablet 0.25 mg  0.25 mg Oral Daily  Harriet Butte, NP   0.25 mg at 08/22/15 0857  . feeding supplement (GLUCERNA SHAKE) (GLUCERNA SHAKE) liquid 237 mL  237 mL Oral TID BM Clayton Bibles, RD   237 mL at 08/22/15 1432  . furosemide (LASIX) tablet 40 mg  40 mg Oral QODAY Harriet Butte, NP   40 mg at 08/22/15 0902  . insulin aspart (novoLOG) injection 0-5 Units  0-5 Units Subcutaneous QHS Harriet Butte, NP   5 Units at 08/17/15 2319  . insulin aspart (novoLOG) injection 0-9 Units  0-9 Units Subcutaneous TID WC Harriet Butte, NP   7 Units at 08/22/15 1717  . insulin glargine (LANTUS) injection 30 Units  30 Units Subcutaneous Q2200 Harriet Butte, NP   30 Units at 08/21/15 2228  . losartan (COZAAR) tablet 12.5 mg  12.5 mg Oral Daily Harriet Butte, NP   12.5 mg at 08/22/15 0856  . magnesium hydroxide (MILK OF MAGNESIA) suspension 30 mL  30 mL Oral Daily PRN Harriet Butte, NP      . mirtazapine (REMERON) tablet 15 mg  15 mg Oral QHS Nicholaus Bloom, MD   15 mg at 08/21/15 2230  . multivitamin with minerals tablet 1 tablet  1 tablet Oral Daily Charlcie Cradle, MD   1 tablet at 08/22/15 0857  . PARoxetine (PAXIL) tablet 20 mg  20 mg Oral QHS Charlcie Cradle, MD   20 mg at 08/21/15 2230  . spironolactone (ALDACTONE) tablet 12.5 mg  12.5 mg Oral Daily Harriet Butte, NP   12.5 mg at 08/22/15 0856  . thiamine (VITAMIN B-1) tablet 100 mg  100 mg Oral Daily Charlcie Cradle, MD   100 mg at 08/22/15 0857  . warfarin (COUMADIN) tablet 5 mg  5 mg Oral ONCE-1800 Nicholaus Bloom, MD      . Warfarin - Pharmacist Dosing Inpatient   Does not apply Amanda Park, [facility]        Lab Results:  Results for orders placed or performed during the hospital encounter of 08/17/15 (from the past 48 hour(s))  Glucose, capillary     Status: Abnormal   Collection Time: 08/20/15  9:23 PM  Result Value Ref Range   Glucose-Capillary 254 (H) 65 - 99 mg/dL  Glucose, capillary  Status: None   Collection Time: 08/21/15  6:18 AM  Result Value Ref Range    Glucose-Capillary 69 65 - 99 mg/dL  Glucose, capillary     Status: None   Collection Time: 08/21/15  6:41 AM  Result Value Ref Range   Glucose-Capillary 90 65 - 99 mg/dL  Protime-INR     Status: Abnormal   Collection Time: 08/21/15  6:45 AM  Result Value Ref Range   Prothrombin Time 26.6 (H) 11.6 - 15.2 seconds   INR 2.49 (H) 0.00 - 1.49    Comment: Performed at [facility]  Glucose, capillary     Status: Abnormal   Collection Time: 08/21/15  7:43 AM  Result Value Ref Range   Glucose-Capillary 163 (H) 65 - 99 mg/dL   Comment 1 Notify RN    Comment 2 Document in Chart   Glucose, capillary     Status: Abnormal   Collection Time: 08/21/15 11:55 AM  Result Value Ref Range   Glucose-Capillary 209 (H) 65 - 99 mg/dL  Glucose, capillary     Status: Abnormal   Collection Time: 08/21/15  4:55 PM  Result Value Ref Range   Glucose-Capillary 190 (H) 65 - 99 mg/dL   Comment 1 Notify RN    Comment 2 Document in Chart   Glucose, capillary     Status: Abnormal   Collection Time: 08/21/15  9:17 PM  Result Value Ref Range   Glucose-Capillary 242 (H) 65 - 99 mg/dL  Glucose, capillary     Status: Abnormal   Collection Time: 08/22/15  6:21 AM  Result Value Ref Range   Glucose-Capillary 182 (H) 65 - 99 mg/dL  Protime-INR     Status: Abnormal   Collection Time: 08/22/15  6:42 AM  Result Value Ref Range   Prothrombin Time 22.4 (H) 11.6 - 15.2 seconds   INR 1.98 (H) 0.00 - 1.49    Comment: Performed at [place] metabolic panel     Status: Abnormal   Collection Time: 08/22/15  6:42 AM  Result Value Ref Range   Sodium 138 135 - 145 mmol/L   Potassium 4.2 3.5 - 5.1 mmol/L   Chloride 105 101 - 111 mmol/L   CO2 27 22 - 32 mmol/L   Glucose, Bld 199 (H) 65 - 99 mg/dL   BUN 16 6 - 20 mg/dL   Creatinine, Ser 0.82 0.61 - 1.24 mg/dL   Calcium 9.4 8.9 - 10.3 mg/dL   GFR calc non Af Amer >60 >60 mL/min   GFR calc Af Amer >60 >60 mL/min    Comment:  (NOTE) The eGFR has been calculated using the CKD EPI equation. This calculation has not been validated in all clinical situations. eGFR's persistently <60 mL/min signify possible Chronic Kidney Disease.    Anion gap 6 5 - 15    Comment: Performed at [facility]  Glucose, capillary     Status: Abnormal   Collection Time: 08/22/15 11:47 AM  Result Value Ref Range   Glucose-Capillary 202 (H) 65 - 99 mg/dL  Glucose, capillary     Status: Abnormal   Collection Time: 08/22/15  4:51 PM  Result Value Ref Range   Glucose-Capillary 347 (H) 65 - 99 mg/dL    Physical Findings: AIMS: Facial and Oral Movements Muscles of Facial Expression: None, normal Lips and Perioral Area: None, normal Jaw: None, normal Tongue: None, normal,Extremity Movements Upper (arms, wrists, hands, fingers): None, normal Lower (legs, knees, ankles,  toes): None, normal, Trunk Movements Neck, shoulders, hips: None, normal, Overall Severity Severity of abnormal movements (highest score from questions above): None, normal Incapacitation due to abnormal movements: None, normal Patient's awareness of abnormal movements (rate only patient's report): No Awareness, Dental Status Current problems with teeth and/or dentures?: No Does patient usually wear dentures?: No  CIWA:  CIWA-Ar Total: 0 COWS:  COWS Total Score: 3  Musculoskeletal: Strength & Muscle Tone: within normal limits Gait & Station: normal Patient leans: normal  Psychiatric Specialty Exam: Review of Systems  Constitutional: Negative.   HENT: Negative.   Eyes: Negative.   Respiratory: Negative.   Cardiovascular: Negative.   Gastrointestinal: Positive for melena.  Genitourinary: Negative.   Musculoskeletal: Negative.   Skin: Negative.   Neurological: Negative.   Endo/Heme/Allergies: Negative.   Psychiatric/Behavioral: Positive for depression and substance abuse. The patient is nervous/anxious.     Blood pressure 92/60, pulse 93,  temperature 98.2 F (36.8 C), temperature source Oral, resp. rate 18, height _0  (1.778 m), weight 64.864 kg (143 lb), SpO2 100 %.Body mass index is 20.52 kg/(m^2).  General Appearance: Fairly Groomed  Engineer, water::  Fair  Speech:  Clear and Coherent  Volume:  Decreased  Mood:  Anxious and Depressed  Affect:  Restricted  Thought Process:  Coherent and Goal Directed  Orientation:  Full (Time, Place, and Person)  Thought Content:  symptoms events worries concerns  Suicidal Thoughts:  No  Homicidal Thoughts:  No  Memory:  Immediate;   Fair Recent;   Fair Remote;   Fair  Judgement:  Fair  Insight:  Present  Psychomotor Activity:  Restlessness  Concentration:  Fair  Recall:  [employer] of Knowledge:Fair  Language: Fair  Akathisia:  No  Handed:  Right  AIMS (if indicated):     Assets:  Desire for Improvement  ADL's:  Intact  Cognition: WNL  Sleep:  Number of Hours: 5   Treatment Plan Summary: Daily contact with patient to assess and evaluate symptoms and progress in treatment and Medication management Supportive approach/coping skills Alcohol/cocaine abuse-dependence: continue to work a relapse prevention plan Depression; continue to work with the Paxil 20 mg daily Diabetes: continue to follow the Int Med recommendations Note: [facility] wants not to have to draw an INR while he is there for two weeks. The thought is that it would not be safe to do this. Will have to explore other options Use CBT/mindfulness LUGO,IRVING A 08/22/2015, 5:43 PM

## 2015-08-22 NOTE — BHH Group Notes (Signed)
   [facility] LCSW Aftercare Discharge Planning Group Note  08/22/2015  8:45 AM   Participation Quality: Alert, Appropriate and Oriented  Mood/Affect: Pleasant and calm  Depression Rating: 4-5  Anxiety Rating: Low anxiety levels  Thoughts of Suicide: Pt denies SI/HI  Will you contract for safety? Yes  Current AVH: Pt denies  Plan for Discharge/Comments: Pt attended discharge planning group and actively participated in group. CSW provided pt with today's workbook. Patient discussed option of discharging and applying for an interview at an [place], as well as discussed possibility of gaining entry to [facility], which depends upon pending results of labs, due to [facility]'s requirement for patient's therapeutic INR levels.  Transportation Means: Pt reports access to transportation  Supports: No supports mentioned at this time  Tilden Fossa, MSW, [place] Social Worker [employer] [phone]

## 2015-08-22 NOTE — Progress Notes (Signed)
ANTICOAGULATION CONSULT NOTE - Follow Up Consult  Pharmacy Consult for Coumadin Indication: left ventricular noncompaction  Allergies  Allergen Reactions  . Metformin And Related Other (See Comments)    Pt reports severe reaction to this and other provider took him off this med    Patient Measurements: Height: '5\' 10"'$  (177.8 cm) Weight: 143 lb (64.864 kg) IBW/kg (Calculated) : 73   Vital Signs: Temp: 98.2 F (36.8 C) (11/09 0738) BP: 101/71 mmHg (11/09 1200) Pulse Rate: 88 (11/09 1200)  Labs:  Recent Labs  08/20/15 0637 08/21/15 0645 08/22/15 0642  LABPROT 21.3* 26.6* 22.4*  INR 1.85* 2.49* 1.98*  CREATININE  --   --  0.82    Estimated Creatinine Clearance: 95.6 mL/min (by C-G formula based on Cr of 0.82).   Medications:  Scheduled:  . atorvastatin  20 mg Oral q1800  . digoxin  0.25 mg Oral Daily  . feeding supplement (GLUCERNA SHAKE)  237 mL Oral TID BM  . furosemide  40 mg Oral QODAY  . insulin aspart  0-5 Units Subcutaneous QHS  . insulin aspart  0-9 Units Subcutaneous TID WC  . insulin glargine  30 Units Subcutaneous Q2200  . losartan  12.5 mg Oral Daily  . mirtazapine  15 mg Oral QHS  . multivitamin with minerals  1 tablet Oral Daily  . PARoxetine  20 mg Oral QHS  . spironolactone  12.5 mg Oral Daily  . thiamine  100 mg Oral Daily  . warfarin  5 mg Oral ONCE-1800  . Warfarin - Pharmacist Dosing Inpatient   Does not apply q1800    Assessment: INR just below goal today at 1.98.  No problems with therapy noted Goal of Therapy:  INR 2-3    Plan:  Coumadin 5 mg x 1 today  PT/INR in am    Lenox Ponds 08/22/2015,12:50 PM

## 2015-08-22 NOTE — Progress Notes (Signed)
D:Per patient self inventory form pt reports he slept good last night with the use of sleep medication. He reports a good appetite, low energy level, good concentration. He rates depression 5/10, hopelessness 5/10, anxiety 5/10- all on 0-10 scale, 10 being the worse. Pt denies S/S of withdrawals. Pt denies SI/HI, AVH. Pt denies physical pain. Pt reports his goal is "keep getting better" and that he will "stay positive" to help meet his goal today.  A:Special checks q 15 mins in place for safety. Medication administered per MD order (See eMAR). CIWA protocol in place. Encouragement and support provided.  R:Safety maintained. Compliant with medication regimen. Will continue to monitor.

## 2015-08-22 NOTE — Progress Notes (Signed)
Pt stated that today has been very frustrating bc he is having issues finding placement.

## 2015-08-22 NOTE — BHH Group Notes (Signed)
[place] LCSW Group Therapy 08/22/2015  1:15 PM Type of Therapy: Group Therapy Participation Level: Active  Participation Quality: Attentive, Sharing and Supportive  Affect: Appropriate  Cognitive: Alert and Oriented  Insight: Developing/Improving and Engaged  Engagement in Therapy: Developing/Improving and Engaged  Modes of Intervention: Clarification, Confrontation, Discussion, Education, Exploration, Limit-setting, Orientation, Problem-solving, Rapport Building, Art therapist, Socialization and Support  Summary of Progress/Problems: The topic for group today was emotional regulation. This group focused on both positive and negative emotion identification and allowed group members to process ways to identify feelings, regulate negative emotions, and find healthy ways to manage internal/external emotions. Group members were asked to reflect on a time when their reaction to an emotion led to a negative outcome and explored how alternative responses using emotion regulation would have benefited them. Group members were also asked to discuss a time when emotion regulation was utilized when a negative emotion was experienced.  Patient discussed the patient's ability to identify feelings experienced by the patient, such as anger or resentment, as well the core issues that caused feelings experienced by the patient, that have resulted in negative outcomes with situations involving substances.  Tilden Fossa, MSW, [place] Worker [facility] [phone]

## 2015-08-22 NOTE — Progress Notes (Signed)
Recreation Therapy Notes  Date: 08/22/15 Time: 930 Location: 300 Hall Dayroom  Group Topic: Stress Management  Goal Area(s) Addresses:  Patient will verbalize importance of using healthy stress management.  Patient will identify positive emotions associated with healthy stress management.   Intervention: Stress Management  Activity :  Progressive Muscle Relaxation.  LRT introduced and educated patients on stress management technique of progressive muscle relaxation.  A script was used to deliver the technique to patients and patients were asked to follow script read allowed by LRT to engage in practicing the stress management technique.    Education:  Stress Management, Discharge Planning.   Education Outcome: Acknowledges edcuation/In group clarification offered/Needs additional education  Clinical Observations/Feedback:  Patient did not attend group.    Victorino Sparrow, LRT/CTRS         Victorino Sparrow A 08/22/2015 4:15 PM

## 2015-08-22 NOTE — Progress Notes (Signed)
Inpatient Diabetes Program Recommendations  AACE/ADA: New Consensus Statement on Inpatient Glycemic Control (2015)  Target Ranges:  Prepandial:   less than 140 mg/dL      Peak postprandial:   less than 180 mg/dL (1-2 hours)      Critically ill patients:  140 - 180 mg/dL   Review of Glycemic Control  Diabetes history: DM 2 Outpatient Diabetes medications: Lantus 30 units Current orders for Inpatient glycemic control: Lantus 30 units, Novolog Sensitive + HS scale  Inpatient Diabetes Program Recommendations:   Noted patient with elevated glucose levels on admission. Currently glucose ranges mid 100-200. These levels are okay while the patient is not in an inpatient medical acute care area. Patient also receiving Glucerna. Patient's elevations seem to be after the administration of the supplement. If patient has good PO intake, consider d/cing the Glucerna.   Thanks,  Tama Headings RN, MSN, [facility] Inpatient Diabetes Coordinator Team Pager [phone] (8a-5p)

## 2015-08-22 NOTE — Progress Notes (Signed)
TRH consulted for DM management. CBG's stable for now, diabetes coordinator consulted. Continue current regimen and follow recommendations of diabetic coordinator. TRH will sign off for now but please call us with additional questions.   Faye Ramsay, MD  [facility] Pager [phone]  If 7PM-7AM, please contact night-coverage [email or web address] Password [email or web address]

## 2015-08-22 NOTE — Progress Notes (Signed)
D:Patient in the hallway on approach.  Patient states he had a good day today.  Patient states he is still trying to make better food choices.  Patient appears brighter today.  Patient states his goal for today was to work on his discharge plan.  Patient states he called an [facility] and states if her can be there by tomorrow at 6 pm they may be able to take him.  Patient states he will talk to the doctor about it tomorrow. Patient denies SI/HI and denies AVH. A: Staff to monitor Q 15 mins for safety.  Encouragement and support offered.  Scheduled medications administered per orders.   R: Patient remains safe on the unit.  Patient attended group tonight.  Patient visible on the unit and interacting with peers.  Patient taking administered medications.

## 2015-08-23 LAB — PROTIME-INR
INR: 2.04 — ABNORMAL HIGH (ref 0.00–1.49)
Prothrombin Time: 22.9 seconds — ABNORMAL HIGH (ref 11.6–15.2)

## 2015-08-23 LAB — GLUCOSE, CAPILLARY
Glucose-Capillary: 198 mg/dL — ABNORMAL HIGH (ref 65–99)
Glucose-Capillary: 301 mg/dL — ABNORMAL HIGH (ref 65–99)
Glucose-Capillary: 271 mg/dL — ABNORMAL HIGH (ref 65–99)

## 2015-08-23 LAB — HEMOGLOBIN A1C
Hgb A1c MFr Bld: 11.9 % — ABNORMAL HIGH (ref 4.8–5.6)
Mean Plasma Glucose: 295 mg/dL

## 2015-08-23 MED ORDER — PAROXETINE HCL 20 MG PO TABS
20.0000 mg | ORAL_TABLET | Freq: Every day | ORAL | Status: DC
Start: 2015-08-23 — End: 2015-12-04

## 2015-08-23 MED ORDER — ADULT MULTIVITAMIN W/MINERALS CH: 1.0000 | ORAL_TABLET | Freq: Every day | ORAL | Status: DC

## 2015-08-23 MED ORDER — WARFARIN SODIUM 5 MG PO TABS
5.0000 mg | ORAL_TABLET | Freq: Once | ORAL | Status: DC
  Filled 2015-08-23: qty 1

## 2015-08-23 MED ORDER — INSULIN GLARGINE 100 UNIT/ML SOLOSTAR PEN: 30.0000 [IU] | PEN_INJECTOR | Freq: Every day | SUBCUTANEOUS | Status: DC

## 2015-08-23 MED ORDER — INSULIN ASPART 100 UNIT/ML FLEXPEN: PEN_INJECTOR | SUBCUTANEOUS | Status: DC

## 2015-08-23 MED ORDER — MIRTAZAPINE 15 MG PO TABS: 15.0000 mg | ORAL_TABLET | Freq: Every day | ORAL | Status: DC

## 2015-08-23 MED ORDER — WARFARIN SODIUM 5 MG PO TABS
5.0000 mg | ORAL_TABLET | Freq: Every day | ORAL | Status: DC
  Administered 2015-08-23: 5 mg via ORAL
  Filled 2015-08-23 (×2): qty 5

## 2015-08-23 MED ORDER — THIAMINE HCL 100 MG PO TABS: 100.0000 mg | ORAL_TABLET | Freq: Every day | ORAL | Status: DC

## 2015-08-23 NOTE — Progress Notes (Signed)
Inpatient Diabetes Program Recommendations  AACE/ADA: New Consensus Statement on Inpatient Glycemic Control (2015)  Target Ranges:  Prepandial:   less than 140 mg/dL      Peak postprandial:   less than 180 mg/dL (1-2 hours)      Critically ill patients:  140 - 180 mg/dL    Results for CADIN, LUKA (MRN [id number]) as of 08/23/2015 07:46  Ref. Range 08/22/2015 06:21 08/22/2015 11:47 08/22/2015 16:51 08/22/2015 20:43  Glucose-Capillary Latest Ref Range: 65-99 mg/dL 182 (H) 202 (H) 347 (H) 198 (H)    Results for DEAKIN, LACEK (MRN [id number]) as of 08/23/2015 07:46  Ref. Range 08/23/2015 06:23  Glucose-Capillary Latest Ref Range: 65-99 mg/dL 301 (H)    Admit: Suicidal  History: DM, CHF, CVA, Polysubstance Abuse  Home DM Meds: Lantus 30 units daily       Novolog SSI (per pt, not taking at home)  Current Insulin Orders: Lantus 30 units QHS      Novolog Sensitive SSI (0-9 units) TID AC + HS     -Not sure why patient's CBG so elevated this AM (CBG 301 mg/dl).  Perhaps this CBG was taken shortly after patient had consumed food??  -Note patient has orders for Glucerna food supplements as well.  If he is eating well, recommend discontinuation of Glucerna food supplements.     MD- Please consider the following recommendations:  1. Increase Lantus to 32 units QHS  2. Start Novolog Meal Coverage- Novolog 4 units tid with meals  3. Consider stopping Glucerna supplements if patient's PO intake is adequate  4. Consider adding Metformin 500 mg bid to patient's home DM medication regimen       --Will follow patient during hospitalization--  Wyn Quaker RN, MSN, CDE Diabetes Coordinator Inpatient Glycemic Control Team Team Pager: [phone] (8a-5p)

## 2015-08-23 NOTE — Progress Notes (Addendum)
  [facility] Adult Case Management Discharge Plan :  Will you be returning to the same living situation after discharge:  No, patient plans to go to [facility] in [place] At discharge, do you have transportation home?: Yes, taxi voucher Do you have the ability to pay for your medications: Yes,  patient will be provided with prescriptions at discharge  Release of information consent forms completed and in the chart;  Patient's signature needed at discharge.  Patient to Follow up at: Follow-up Information    Follow up with [facility].   Why:  Walk-in clinic Monday-Friday between 8am to 3pm for assessment for therapy and medication management services. Please bring insurance card.   Contact information:   [address], [place], [place] [address] [phone]      Next level of care provider has access to [place]  Patient denies SI/HI: Yes,  denies    Safety Planning and Suicide Prevention discussed: Yes,  denies  Have you used any form of tobacco in the last 30 days? (Cigarettes, Smokeless Tobacco, Cigars, and/or Pipes): Yes  Has patient been referred to the Quitline?: Patient refused referral  Drinkard, Casimiro Needle 08/23/2015, 11:50 AM

## 2015-08-23 NOTE — Progress Notes (Signed)
Patient ID: Dennis [name], male   DOB: 1961/08/15, 54 y.o.   MRN: [id number]  Pt currently presents with a flat affect and depressed behavior. Per self inventory, pt rates depression at a 0, hopelessness 1 and anxiety 0. Pt's daily goal is to "help someone else today/be kind to others" and they intend to do so by "be nice to those around me." Pt reports good sleep, a good appetite, normal energy and good concentration.   Pt provided with medications per providers orders. Pt's labs and vitals were monitored throughout the day. Pt supported emotionally and encouraged to express concerns and questions. Pt educated on medications. [facility] pharmacy called to transfer current medications to the OP clinic, pt to pick up prescriptions before heading to [place]. Pt able to correctly identify low carb meal options. Pt given 5 warfarin doses to make it until his next lab draw apt.   Pt's safety ensured with 15 minute and environmental checks. Pt currently denies SI/HI and A/V hallucinations. Pt verbally agrees to seek staff if SI/HI or A/VH occurs and to consult with staff before acting on these thoughts. Pt to be discharged per MD orders. Will continue POC.

## 2015-08-23 NOTE — Progress Notes (Signed)
Patient ID: Dennis [name], male   DOB: 1960/12/29, 54 y.o.   MRN: [id number]   Pt discharged home with a taxi voucher. Pt given the opportunity to get med samples from [facility] before heading to [place], prescriptions called in. Pt was stable and appreciative at that time. All papers and prescriptions were given and valuables returned. Verbal understanding expressed. Denies SI/HI and A/VH. Pt given opportunity to express concerns and ask questions.

## 2015-08-23 NOTE — Progress Notes (Signed)
ANTICOAGULATION CONSULT NOTE - Follow Up Consult  Pharmacy Consult for Warfarin Indication: left ventricular noncompaction  Allergies  Allergen Reactions  . Metformin And Related Other (See Comments)    Pt reports severe reaction to this and other provider took him off this med    Patient Measurements: Height: '5\' 10"'$  (177.8 cm) Weight: 143 lb (64.864 kg) IBW/kg (Calculated) : 73 Heparin Dosing Weight:   Vital Signs: Temp: 98.4 F (36.9 C) (11/10 0700) Temp Source: Oral (11/10 0700) BP: 106/65 mmHg (11/10 0700) Pulse Rate: 93 (11/10 0700)  Labs:  Recent Labs  08/21/15 0645 08/22/15 0642 08/23/15 0630  LABPROT 26.6* 22.4* 22.9*  INR 2.49* 1.98* 2.04*  CREATININE  --  0.82  --     Estimated Creatinine Clearance: 95.6 mL/min (by C-G formula based on Cr of 0.82).   Medications:  Scheduled:  . atorvastatin  20 mg Oral q1800  . digoxin  0.25 mg Oral Daily  . feeding supplement (GLUCERNA SHAKE)  237 mL Oral TID BM  . furosemide  40 mg Oral QODAY  . insulin aspart  0-5 Units Subcutaneous QHS  . insulin aspart  0-9 Units Subcutaneous TID WC  . insulin glargine  30 Units Subcutaneous Q2200  . losartan  12.5 mg Oral Daily  . mirtazapine  15 mg Oral QHS  . multivitamin with minerals  1 tablet Oral Daily  . PARoxetine  20 mg Oral QHS  . spironolactone  12.5 mg Oral Daily  . thiamine  100 mg Oral Daily  . Warfarin - Pharmacist Dosing Inpatient   Does not apply q1800   PRN: acetaminophen, alum & mag hydroxide-simeth, magnesium hydroxide  Assessment: INR =2.04 and at goal of INR=2-3.  No problems with therapy noted Goal of Therapy:  INR 2-3    Plan:  Will give Warfarin '5mg'$  po X1 PT/INR in AM  Gypsy Decant 08/23/2015,12:29 PM

## 2015-08-23 NOTE — Discharge Summary (Signed)
Physician Discharge Summary Note  Patient:  Dennis [name] is an 54 y.o., male MRN:  [id number] DOB:  1961/02/06 Patient phone:  [phone] (home)  Patient address:   [address] [place] [address],  Total Time spent with patient: 30 minutes  Date of Admission:  08/17/2015 Date of Discharge: 08/23/2015  Reason for Admission: PER H&P  54 year old male who  has a past medical history of Chronic systolic heart failure ([place]); Automatic implantable cardioverter-defibrillator in situ; Heart murmur; Sleep apnea; 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[place]); Ischemic cardiomyopathy; Type II diabetes mellitus ([place]); High cholesterol; Pneumonia (1988); Drug abuse and dependence ([place]); and CHF (congestive heart failure) ([place]). Patient is currently admitted to the behavioral [facility] for depression, patient has a history of diabetes mellitus, CHF, CVA , CAD, CHF EF 10-15%, on chronic anticoagulation with Coumadin. Patient takes Lantus 30 units daily, but says that he has not been compliant with taking Lantus every day at home as he ran out a few days ago. Patient says his blood sugar at home has not been controlled and has been running in high 200s. At [facility] patient has been started on Lantus 30 units subcutaneous daily along with sliding scale insulin. Blood glucose has been well controlled. The patient has also been on warfarin managed per pharmacy. INR is therapeutic. He denies any symptoms. No chest pain or shortness of breath. No nausea vomiting or diarrhea. No fever no dysuria urgency frequency of urination.  Principal Problem: MDD (major depressive disorder), recurrent severe, without psychosis [facility]) Discharge Diagnoses: Patient Active Problem List   Diagnosis Date Noted  . MDD (major depressive disorder), recurrent severe, without psychosis ([place]) [F33.2] 08/18/2015    Priority: Medium  . Cocaine abuse with cocaine-induced mood disorder ([place]) [F14.14] 08/20/2015  . Alcohol abuse with  alcohol-induced mood disorder ([place]) [F10.14] 08/18/2015  . Substance induced mood disorder ([place]) [F19.94] 08/17/2015  . Psychoactive substance-induced mood disorder ([place]) [Q65.78, F06.30] 07/17/2015  . Open wound of foot [S91.309A] 02/27/2015  . Acute CVA (cerebrovascular accident) ([place]) [I63.9] 02/07/2015  . HLD (hyperlipidemia) [E78.5]   . Status post PICC central line placement [Z95.828]   . History of ischemic right MCA stroke [Z86.73]   . Type 2 diabetes mellitus with complication (HCC) [I69.6]   . Acute on chronic systolic congestive heart failure ([place]) [I50.23]   . Stroke [facility]) [I63.9]   . Stroke with cerebral ischemia ([place]) [I63.9]   . CVA (cerebral infarction) [I63.9] 01/26/2015  . Left-sided weakness [M62.89]   . Embolic stroke involving right middle cerebral artery ([place]) [I63.411]   . Cardiac LV ejection fraction 10-20% [R94.30]   . 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[place]) [I42.8]   . Acute systolic heart failure ([place]) [I50.21] 01/24/2015  . Acute ischemic stroke ([place]) [I63.9] 01/24/2015  . Cocaine abuse [F14.10] 01/24/2015  . H/O noncompliance with medical treatment, presenting hazards to health [Z91.19] 01/24/2015  . Tobacco abuse [Z72.0] 01/24/2015  . Essential hypertension [I10] 01/24/2015  . Hypotension [I95.9] 01/24/2015  . Hyperlipidemia [E78.5] 01/24/2015  . Dysphagia following cerebral infarction [I69.391] 01/24/2015  . Hemiplegia affecting left nondominant side ([place]) [G81.94] 01/24/2015  . Diabetes mellitus type 2, uncontrolled, with complications ([place]) [E95.2, E11.65] 01/24/2015  . Hyperlipidemia associated with type 2 diabetes mellitus ([place]) [E11.69, E78.5] 11/30/2014  . CAD (coronary artery disease) [I25.10] 11/16/2014  . Acute on chronic systolic CHF (congestive heart failure) ([place]) [I50.23] 09/21/2014  . Chest pain on breathing [R07.1] 09/21/2014  . Chronic systolic heart failure ([place]) [I50.22] 06/20/2014  . Acute on chronic systolic  heart failure ([place])  [I50.23]   . Automatic implantable cardioverter-defibrillator in situ [Z95.810]   . 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[place]) [I42.8] 05/27/2014  . Dyspnea [R06.00] 05/26/2014  . Chest discomfort [R07.89] 08/09/2013  . Long term (current) use of anticoagulants [Z79.01] 08/09/2013  . Drug abuse and dependence ([place]) [F19.20]   . CHF (congestive heart failure) ([place]) [I50.9]   . BP (high blood pressure) [I10] 03/04/2013  . Cardiomyopathy, ischemic [I25.5] 07/21/2011  . Type II diabetes mellitus with ophthalmic manifestations ([place]) [E11.39] 09/13/2007  . TOBACCO ABUSE [F17.200] 09/13/2007  . WEIGHT LOSS, ABNORMAL [R63.4] 09/13/2007  . SOB [R06.02] 09/13/2007    Musculoskeletal: Strength & Muscle Tone: within normal limits Gait & Station: normal Patient leans: N/A  Psychiatric Specialty Exam: SEE SRA BY MD Physical Exam  Nursing note and vitals reviewed. Constitutional: He is oriented to person, place, and time. He appears well-developed.  HENT:  Head: Normocephalic.  Neck: Normal range of motion. Neck supple.  Neurological: He is alert and oriented to person, place, and time.  Skin: Skin is warm and dry.  Psychiatric: He has a normal mood and affect. His behavior is normal. Judgment and thought content normal.    Review of Systems  Psychiatric/Behavioral: Negative for suicidal ideas. Depression: stable. Substance abuse: stable. Nervous/anxious: stable.   All other systems reviewed and are negative.   Blood pressure 106/65, pulse 93, temperature 98.4 F (36.9 C), temperature source Oral, resp. rate 16, height _0  (1.778 m), weight 64.864 kg (143 lb), SpO2 100 %.Body mass index is 20.52 kg/(m^2).  Have you used any form of tobacco in the last 30 days? (Cigarettes, Smokeless Tobacco, Cigars, and/or Pipes): Yes  Has this patient used any form of tobacco in the last 30 days? (Cigarettes, Smokeless Tobacco, Cigars, and/or Pipes) Yes, A prescription for an FDA-approved tobacco cessation  medication was offered at discharge and the patient refused  Past Medical History:  Past Medical History  Diagnosis Date  . Chronic systolic heart failure (HCC)     a) Mixed ICM/NICM b) RHC (05/2014): RA 2, RV 19/2/3, PA 22/14 (18), PCWP 6, Fick CO/CI: 5.2 / 2.7, PVR 2.3 WU, PA 60% and 64% c) ECHO (05/2014): EF 20-25%, diff HK, akinesis entireanteroseptal myocardium, triv AI, mod MR, LA mod/sev dilated  . Automatic implantable cardioverter-defibrillator in situ   . Heart murmur   . Sleep apnea     "cleared after T&A"  . 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[place])   . Ischemic cardiomyopathy     a) Coronary angiography (12/2008) at [facility]: Lmain: nl, LAD mid 100% stenosis with left to left and right-to-left collaterals to the distal LAD; Lcx: nl, RCA nl.    . Type II diabetes mellitus ([place])   . High cholesterol   . Pneumonia 1988  . Drug abuse and dependence ([place])   . CHF (congestive heart failure) [facility])     Past Surgical History  Procedure Laterality Date  . Forearm fracture surgery Left 1980  . Cardiac defibrillator placement  03/2011  . Cholecystectomy  1994  . Right heart catheterization N/A 05/30/2014    Procedure: RIGHT HEART CATH;  Surgeon: Jolaine Artist, MD;  Location[facility] CATH LAB;  Service: Cardiovascular;  Laterality: N/A;  . Fracture surgery    . Cardiac catheterization  05/2014  . Tonsillectomy and adenoidectomy  ~ 1998  . Right heart catheterization N/A 02/07/2015    Procedure: RIGHT HEART CATH;  Surgeon: Jolaine Artist, MD;  Location: [facility] CATH LAB;  Service: Cardiovascular;  Laterality: N/A;   Family History:  Family History  Problem Relation Age of Onset  . Diabetes Mother   . Heart disease Mother   . Diabetes Father   . Prostate cancer Father   . Heart disease Father   . Diabetes Brother   . Diabetes Brother    Social History:  History  Alcohol Use  . 0.0 oz/week  . 0 Standard drinks or equivalent per week    Comment: 09/21/2014 "might have a drink q 6  /months, if that"     History  Drug Use  . Yes  . Special: Cocaine    Comment: 09/21/2014 "last cocaine ~ 1 1/2 months ago"    Social History   Social History  . Marital Status: Single    Spouse Name: N/A  . Number of Children: 1  . Years of Education: 14   Occupational History  . Disabled    Social History Main Topics  . Smoking status: Current Some Day Smoker -- 0.25 packs/day for 31 years    Types: Cigarettes  . Smokeless tobacco: Never Used  . Alcohol Use: 0.0 oz/week    0 Standard drinks or equivalent per week     Comment: 09/21/2014 "might have a drink q 6 /months, if that"  . Drug Use: Yes    Special: Cocaine     Comment: 09/21/2014 "last cocaine ~ 1 1/2 months ago"  . Sexual Activity: Yes   Other Topics Concern  . None   Social History Narrative   Lives in [place] with his mother. No pets. Fun: movies, sports, daughter.    Denies religious beliefs that would effect health care.     Risk to Self: Is patient at risk for suicide?: Yes What has been your use of drugs/alcohol within the last 12 months?: Alcohol daily, crack cocaine daily Risk to Others:   Prior Inpatient Therapy:   Prior Outpatient Therapy:    Level of Care:  OP  Hospital Course:  Dennis [name] was admitted for MDD (major depressive disorder), recurrent severe, without psychosis ([place]) and crisis management.  He  was treated discharged with the medications listed below under Medication List.  Medical problems were identified and treated as needed.  Home medications were restarted as appropriate.  Improvement was monitored by observation and Dennis [name] daily report of symptom reduction.  Emotional and mental status was monitored by daily self-inventory reports completed by Dennis [name] and clinical staff.         Dennis [name] was evaluated by the treatment team for stability and plans for continued recovery upon discharge.  Dennis [name] motivation was an integral factor for scheduling further  treatment.  Employment, transportation, bed availability, health status, family support, and any pending legal issues were also considered during his hospital stay.  He was offered further treatment options upon discharge including but not limited to Residential, Intensive Outpatient, and Outpatient treatment.  Dennis [name] will follow up with the services as listed below under Follow Up Information.     Upon completion of this admission the patient was both mentally and medically stable for discharge denying suicidal/homicidal ideation, auditory/visual/tactile hallucinations, delusional thoughts and paranoia.      Consults:  psychiatry  Significant Diagnostic Studies:  labs: reviewed  Discharge Vitals:   Blood pressure 106/65, pulse 93, temperature 98.4 F (36.9 C), temperature source Oral, resp. rate 16, height _0  (1.778 m), weight 64.864 kg (143 lb), SpO2 100 %. Body mass index is 20.52 kg/(m^2). Lab Results:  Results for orders placed or performed during the hospital encounter of 08/17/15 (from the past 72 hour(s))  Glucose, capillary     Status: Abnormal   Collection Time: 08/20/15  5:12 PM  Result Value Ref Range   Glucose-Capillary 243 (H) 65 - 99 mg/dL  Glucose, capillary     Status: Abnormal   Collection Time: 08/20/15  9:23 PM  Result Value Ref Range   Glucose-Capillary 254 (H) 65 - 99 mg/dL  Glucose, capillary     Status: None   Collection Time: 08/21/15  6:18 AM  Result Value Ref Range   Glucose-Capillary 69 65 - 99 mg/dL  Glucose, capillary     Status: None   Collection Time: 08/21/15  6:41 AM  Result Value Ref Range   Glucose-Capillary 90 65 - 99 mg/dL  Protime-INR     Status: Abnormal   Collection Time: 08/21/15  6:45 AM  Result Value Ref Range   Prothrombin Time 26.6 (H) 11.6 - 15.2 seconds   INR 2.49 (H) 0.00 - 1.49    Comment: Performed at [facility]  Glucose, capillary     Status: Abnormal   Collection Time: 08/21/15  7:43 AM  Result  Value Ref Range   Glucose-Capillary 163 (H) 65 - 99 mg/dL   Comment 1 Notify RN    Comment 2 Document in Chart   Glucose, capillary     Status: Abnormal   Collection Time: 08/21/15 11:55 AM  Result Value Ref Range   Glucose-Capillary 209 (H) 65 - 99 mg/dL  Glucose, capillary     Status: Abnormal   Collection Time: 08/21/15  4:55 PM  Result Value Ref Range   Glucose-Capillary 190 (H) 65 - 99 mg/dL   Comment 1 Notify RN    Comment 2 Document in Chart   Glucose, capillary     Status: Abnormal   Collection Time: 08/21/15  9:17 PM  Result Value Ref Range   Glucose-Capillary 242 (H) 65 - 99 mg/dL  Glucose, capillary     Status: Abnormal   Collection Time: 08/22/15  6:21 AM  Result Value Ref Range   Glucose-Capillary 182 (H) 65 - 99 mg/dL  Protime-INR     Status: Abnormal   Collection Time: 08/22/15  6:42 AM  Result Value Ref Range   Prothrombin Time 22.4 (H) 11.6 - 15.2 seconds   INR 1.98 (H) 0.00 - 1.49    Comment: Performed at [facility] metabolic panel     Status: Abnormal   Collection Time: 08/22/15  6:42 AM  Result Value Ref Range   Sodium 138 135 - 145 mmol/L   Potassium 4.2 3.5 - 5.1 mmol/L   Chloride 105 101 - 111 mmol/L   CO2 27 22 - 32 mmol/L   Glucose, Bld 199 (H) 65 - 99 mg/dL   BUN 16 6 - 20 mg/dL   Creatinine, Ser 0.82 0.61 - 1.24 mg/dL   Calcium 9.4 8.9 - 10.3 mg/dL   GFR calc non Af Amer >60 >60 mL/min   GFR calc Af Amer >60 >60 mL/min    Comment: (NOTE) The eGFR has been calculated using the CKD EPI equation. This calculation has not been validated in all clinical situations. eGFR's persistently <60 mL/min signify possible Chronic Kidney Disease.    Anion gap 6 5 - 15    Comment: Performed at [facility]  Hemoglobin A1c     Status: Abnormal   Collection Time: 08/22/15  6:42  AM  Result Value Ref Range   Hgb A1c MFr Bld 11.9 (H) 4.8 - 5.6 %    Comment: (NOTE)         Pre-diabetes: 5.7 - 6.4         Diabetes:  >6.4         Glycemic control for adults with diabetes: <7.0    Mean Plasma Glucose 295 mg/dL    Comment: (NOTE) Performed At: [facility] [place], [place] [id number] Lindon Romp MD [id number] Performed at [facility]   Glucose, capillary     Status: Abnormal   Collection Time: 08/22/15 11:47 AM  Result Value Ref Range   Glucose-Capillary 202 (H) 65 - 99 mg/dL  Glucose, capillary     Status: Abnormal   Collection Time: 08/22/15  4:51 PM  Result Value Ref Range   Glucose-Capillary 347 (H) 65 - 99 mg/dL  Glucose, capillary     Status: Abnormal   Collection Time: 08/22/15  8:43 PM  Result Value Ref Range   Glucose-Capillary 198 (H) 65 - 99 mg/dL  Glucose, capillary     Status: Abnormal   Collection Time: 08/23/15  6:23 AM  Result Value Ref Range   Glucose-Capillary 301 (H) 65 - 99 mg/dL  Protime-INR     Status: Abnormal   Collection Time: 08/23/15  6:30 AM  Result Value Ref Range   Prothrombin Time 22.9 (H) 11.6 - 15.2 seconds   INR 2.04 (H) 0.00 - 1.49    Comment: Performed at [facility]  Glucose, capillary     Status: Abnormal   Collection Time: 08/23/15 12:14 PM  Result Value Ref Range   Glucose-Capillary 271 (H) 65 - 99 mg/dL    Physical Findings: AIMS: Facial and Oral Movements Muscles of Facial Expression: None, normal Lips and Perioral Area: None, normal Jaw: None, normal Tongue: None, normal,Extremity Movements Upper (arms, wrists, hands, fingers): None, normal Lower (legs, knees, ankles, toes): None, normal, Trunk Movements Neck, shoulders, hips: None, normal, Overall Severity Severity of abnormal movements (highest score from questions above): None, normal Incapacitation due to abnormal movements: None, normal Patient's awareness of abnormal movements (rate only patient's report): No Awareness, Dental Status Current problems with teeth and/or dentures?: No Does patient usually wear  dentures?: No  CIWA:  CIWA-Ar Total: 0 COWS:  COWS Total Score: 3   See Psychiatric Specialty Exam and Suicide Risk Assessment completed by Attending Physician prior to discharge.  Discharge destination:  Home  Is patient on multiple antipsychotic therapies at discharge:  No   Has Patient had three or more failed trials of antipsychotic monotherapy by history:  No  Recommended Plan for Multiple Antipsychotic Therapies: NA  Discharge Instructions    Activity as tolerated - No restrictions    Complete by:  As directed      Diet general    Complete by:  As directed      Discharge instructions    Complete by:  As directed   Patient has been instructed to take medications as prescribed; and report adverse effects to outpatient provider.  Follow up with primary doctor for any medical issues and If symptoms recur report to nearest emergency or crisis hot line.            Medication List    STOP taking these medications        hydrocerin Crea      TAKE these medications      Indication  atorvastatin 20 MG tablet  Commonly known as:  LIPITOR  Take 1 tablet (20 mg total) by mouth daily at 6 PM.      digoxin 0.25 MG tablet  Commonly known as:  LANOXIN  Take 1 tablet (0.25 mg total) by mouth daily.      furosemide 40 MG tablet  Commonly known as:  LASIX  Take 40 mg by mouth every other day.      glucose blood test strip  Commonly known as:  ONETOUCH VERIO  Use as instructed      insulin aspart 100 UNIT/ML FlexPen  Commonly known as:  NOVOLOG FLEXPEN  Use as directed.   Indication:  Type 2 Diabetes     Insulin Glargine 100 UNIT/ML Solostar Pen  Commonly known as:  LANTUS  Inject 30 Units into the skin daily at 10 pm. Dispense pen and needles      losartan 25 MG tablet  Commonly known as:  COZAAR  Take 0.5 tablets (12.5 mg total) by mouth daily.      mirtazapine 15 MG tablet  Commonly known as:  REMERON  Take 1 tablet (15 mg total) by mouth at bedtime.    Indication:  Trouble Sleeping     multivitamin with minerals Tabs tablet  Take 1 tablet by mouth daily.      PARoxetine 20 MG tablet  Commonly known as:  PAXIL  Take 1 tablet (20 mg total) by mouth at bedtime.   Indication:  mood stablization     QC PEN NEEDLES 31G X 6 MM Misc  Generic drug:  Insulin Pen Needle  1 each by Other route See admin instructions. Use daily with insulin.      spironolactone 25 MG tablet  Commonly known as:  ALDACTONE  Take 0.5 tablets (12.5 mg total) by mouth daily.      thiamine 100 MG tablet  Take 1 tablet (100 mg total) by mouth daily.   Indication:  mult vitam     warfarin 7.5 MG tablet  Commonly known as:  COUMADIN  Take as directed by coumadin clinic            Follow-up Information    Follow up with [facility].   Why:  Walk-in clinic Monday-Friday between 8am to 3pm for assessment for therapy and medication management services. Please bring insurance card.   Contact information:   [place], [place], [place] [address] [phone]      Follow-up recommendations:  Activity:  as tolerated Diet:  heart healthy Other:  Made appt with  on MON for walk-in Lab collection for INR and patient has appt with Cardiologist on 10/28  Comments:  Take all of you medications as prescribed by your mental healthcare provider.  Report any adverse effects and reactions from your medications to your outpatient provider promptly. Do not engage in alcohol and or illegal drug use while on prescription medicines. In the event of worsening symptoms call the crisis hotline, 911, and or go to the nearest emergency department for appropriate evaluation and treatment of symptoms. Follow-up with your primary care provider for your medical issues, concerns and or health care needs.   Keep all scheduled appointments.  If you are unable to keep an appointment call to reschedule.  Let the nurse know if you will need medications before next scheduled  appointment.  Total Discharge Time:  30 Minutes   Signed: [place] 08/23/2015, 1:49 PM  I personally assessed the patient  and formulated the plan Geralyn Flash A. Sabra Heck, M.D.

## 2015-08-23 NOTE — [facility] Suicide Risk Assessment (Signed)
[facility] Discharge Suicide Risk Assessment   Demographic Factors:  Male  Total Time spent with patient: 30 minutes  Musculoskeletal: Strength & Muscle Tone: within normal limits Gait & Station: normal Patient leans: normal  Psychiatric Specialty Exam: Physical Exam  Review of Systems  Constitutional: Negative.   HENT: Negative.   Eyes: Negative.   Respiratory: Negative.   Cardiovascular: Negative.   Gastrointestinal: Negative.   Genitourinary: Negative.   Musculoskeletal: Negative.   Skin: Negative.   Neurological: Negative.   Endo/Heme/Allergies: Negative.   Psychiatric/Behavioral: Positive for depression and substance abuse.    Blood pressure 106/65, pulse 93, temperature 98.4 F (36.9 C), temperature source Oral, resp. rate 16, height '5\' 10"'$  (1.778 m), weight 64.864 kg (143 lb), SpO2 100 %.Body mass index is 20.52 kg/(m^2).  General Appearance: Fairly Groomed  Engineer, water::  Fair  Speech:  Clear and [id number]  Volume:  Normal  Mood:  Euthymic  Affect:  Restricted  Thought Process:  Coherent and Goal Directed  Orientation:  Full (Time, Place, and Person)  Thought Content:  plans as he moves on, relapse prevention plan  Suicidal Thoughts:  No  Homicidal Thoughts:  No  Memory:  Immediate;   Fair Recent;   Fair Remote;   Fair  Judgement:  Fair  Insight:  Present  Psychomotor Activity:  Normal  Concentration:  Fair  Recall:  [employer] [place]  Language: Fair  Akathisia:  No  Handed:  Right  AIMS (if indicated):     Assets:  Desire for Improvement  Sleep:  Number of Hours: 5  Cognition: WNL  ADL's:  Intact   Have you used any form of tobacco in the last 30 days? (Cigarettes, Smokeless Tobacco, Cigars, and/or Pipes): Yes  Has this patient used any form of tobacco in the last 30 days? (Cigarettes, Smokeless Tobacco, Cigars, and/or Pipes) Yes, A prescription for an FDA-approved tobacco cessation medication was offered at discharge and the patient  refused  Mental Status Per Nursing Assessment::   On Admission:  Suicidal ideation indicated by others  Current Mental Status by Physician: In full contact with reality. There are no active S/S of withdrawal. There are no active SI plans or intent. He was able to find an [employer] in [place]. He is disapointed he was not able to go back to [facility] but states he has been in an [employer] before going to meetings and that it did work for him. Aware of his need to follow up medically and mental health wise   Loss Factors: Decline in physical health  Historical Factors: NA  Risk Reduction Factors:   Living with another person, especially a relative  Continued Clinical Symptoms:  Alcohol/Substance Abuse/Dependencies  Cognitive Features That Contribute To Risk:  Closed-mindedness, Polarized thinking and Thought constriction (tunnel vision)    Suicide Risk:  Minimal: No identifiable suicidal ideation.  Patients presenting with no risk factors but with morbid ruminations; may be classified as minimal risk based on the severity of the depressive symptoms  Principal Problem: MDD (major depressive disorder), recurrent severe, without psychosis [facility]) Discharge Diagnoses:  Patient Active Problem List   Diagnosis Date Noted  . Cocaine abuse with cocaine-induced mood disorder ([place]) [F14.14] 08/20/2015  . Alcohol abuse with alcohol-induced mood disorder ([place]) [F10.14] 08/18/2015  . MDD (major depressive disorder), recurrent severe, without psychosis ([place]) [F33.2] 08/18/2015  . Substance induced mood disorder ([place]) [F19.94] 08/17/2015  . Psychoactive substance-induced mood disorder ([place]) [N98.92, F06.30] 07/17/2015  . Open wound  of foot [S91.309A] 02/27/2015  . Acute CVA (cerebrovascular accident) ([place]) [I63.9] 02/07/2015  . HLD (hyperlipidemia) [E78.5]   . Status post PICC central line placement [Z95.828]   . History of ischemic right MCA stroke [Z86.73]   . Type 2 diabetes mellitus  with complication (HCC) [P29.5]   . Acute on chronic systolic congestive heart failure ([place]) [I50.23]   . Stroke [facility]) [I63.9]   . Stroke with cerebral ischemia ([place]) [I63.9]   . CVA (cerebral infarction) [I63.9] 01/26/2015  . Left-sided weakness [M62.89]   . Embolic stroke involving right middle cerebral artery ([place]) [I63.411]   . Cardiac LV ejection fraction 10-20% [R94.30]   . 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[place]) [I42.8]   . Acute systolic heart failure ([place]) [I50.21] 01/24/2015  . Acute ischemic stroke ([place]) [I63.9] 01/24/2015  . Cocaine abuse [F14.10] 01/24/2015  . H/O noncompliance with medical treatment, presenting hazards to health [Z91.19] 01/24/2015  . Tobacco abuse [Z72.0] 01/24/2015  . Essential hypertension [I10] 01/24/2015  . Hypotension [I95.9] 01/24/2015  . Hyperlipidemia [E78.5] 01/24/2015  . Dysphagia following cerebral infarction [I69.391] 01/24/2015  . Hemiplegia affecting left nondominant side ([place]) [G81.94] 01/24/2015  . Diabetes mellitus type 2, uncontrolled, with complications ([place]) [J88.4, E11.65] 01/24/2015  . Hyperlipidemia associated with type 2 diabetes mellitus ([place]) [E11.69, E78.5] 11/30/2014  . CAD (coronary artery disease) [I25.10] 11/16/2014  . Acute on chronic systolic CHF (congestive heart failure) ([place]) [I50.23] 09/21/2014  . Chest pain on breathing [R07.1] 09/21/2014  . Chronic systolic heart failure ([place]) [I50.22] 06/20/2014  . Acute on chronic systolic heart failure ([place]) [I50.23]   . Automatic implantable cardioverter-defibrillator in situ [Z95.810]   . 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[place]) [I42.8] 05/27/2014  . Dyspnea [R06.00] 05/26/2014  . Chest discomfort [R07.89] 08/09/2013  . Long term (current) use of anticoagulants [Z79.01] 08/09/2013  . Drug abuse and dependence ([place]) [F19.20]   . CHF (congestive heart failure) ([place]) [I50.9]   . BP (high blood pressure) [I10] 03/04/2013  . Cardiomyopathy, ischemic [I25.5] 07/21/2011  . Type II  diabetes mellitus with ophthalmic manifestations ([place]) [E11.39] 09/13/2007  . TOBACCO ABUSE [F17.200] 09/13/2007  . WEIGHT LOSS, ABNORMAL [R63.4] 09/13/2007  . SOB [R06.02] 09/13/2007    Follow-up Information    Follow up with [facility].   Why:  Walk-in clinic Monday-Friday between 8am to 3pm for assessment for therapy and medication management services. Please bring insurance card.   Contact information:   [place], [place], [place] [address] [phone]      Follow up with [facility].   Why:  labs: for INR dosing   Contact information:   [place]- by Mon/Tue for Cardiologist  with Thompson Grayer anytime bewteen 7:30-11:30 [address] [place], [place], [place] [address]      Plan Of Care/Follow-up recommendations:  Activity:  as tolerated Diet:  as per nutritionist  Follow up as above Is patient on multiple antipsychotic therapies at discharge:  No   Has Patient had three or more failed trials of antipsychotic monotherapy by history:  No  Recommended Plan for Multiple Antipsychotic Therapies: NA    LUGO,IRVING A 08/23/2015, 3:23 PM

## 2015-08-23 NOTE — Tx Team (Signed)
Interdisciplinary Treatment Plan Update (Adult) Date: 08/23/2015   Time Reviewed: 9:30 AM  Progress in Treatment: Attending groups: Yes Participating in groups: Yes Taking medication as prescribed: Yes Tolerating medication: Yes Family/Significant other contact made: No, patient has declined collateral contact Patient understands diagnosis: Yes Discussing patient identified problems/goals with staff: Yes Medical problems stabilized or resolved: Yes Denies suicidal/homicidal ideation: Yes Issues/concerns per patient self-inventory: Yes Other:  New problem(s) identified: N/A  Discharge Plan or Barriers: Patient plans to discharge to [facility] in [place] to follow up with outpatient services.     Reason for Continuation of Hospitalization:  Depression Anxiety Medication Stabilization   Comments: N/A  Estimated length of stay: discharge anticipated for today 11/10   Dennis [name] is a 54yo male hospitalized with worsening depression and suicidal ideations with a plan to jump in front of a train. Is severely isolative, has disability for medical issues including congestive heart failure. Has a long hx of substance abuse, reporting that he uses $50 worth of crack cocaine and a pint of EOTH on a near-daily basis since 2012. He has a hx of seizures, with the most recent one being in 10/2014. He went to [facility] years ago and then to an [facility], was sober for 4 years, would like to try to get into [facility] again. Has no mental health providers. The patient would benefit from safety monitoring, medication evaluation, psychoeducation, group therapy, and discharge planning to link with ongoing resources. The patient refused referral to [facility] for smoking cessation. The Discharge Process and Patient Involvement form was reviewed, signed and placed in the paper chart. Suicide Prevention Education was reviewed thoroughly, and a brochure left with patient. The patient refused consent for SPE  to be provided to anyone.     Review of initial/current patient goals per problem list:  1. Goal(s): Patient will participate in aftercare plan   Met: Yes   Target date: 3-5 days post admission date   As evidenced by: Patient will participate within aftercare plan AEB aftercare provider and housing plan at discharge being identified.  11/7: Goal not met: CSW assessing for appropriate referrals for pt and will have follow up secured prior to d/c.  11/10: Goal met: Patient plans to discharge to [facility] in [place] to follow up with outpatient services.      2. Goal (s): Patient will exhibit decreased depressive symptoms and suicidal ideations.   Met: Adequate for discharge per MD   Target date: 3-5 days post admission date   As evidenced by: Patient will utilize self rating of depression at 3 or below and demonstrate decreased signs of depression or be deemed stable for discharge by MD.   11/7: Goal not met: Pt presents with flat affect and depressed mood.  Pt admitted with depression rating of 10.  Pt to show decreased sign of depression and a rating of 3 or less before d/c.    11/10: Adequate for discharge. Patient reports feeling safe for discharge and denies SI.   3. Goal(s): Patient will demonstrate decreased signs and symptoms of anxiety.   Met: Adequate for discharge per MD   Target date: 3-5 days post admission date   As evidenced by: Patient will utilize self rating of anxiety at 3 or below and demonstrated decreased signs of anxiety, or be deemed stable for discharge by MD   11/7: Goal not met: Pt presents with anxious mood and affect.  Pt admitted with anxiety rating of 10.  Pt to show  decreased sign of anxiety and a rating of 3 or less before d/c.   11/10: Adequate for discharge. Patient reports baseline level of anxiety and reports feeling safe for discharge.    4. Goal(s): Patient will demonstrate decreased signs of withdrawal due to  substance abuse   Met: Yes   Target date: 3-5 days post admission date   As evidenced by: Patient will produce a CIWA/COWS score of 0, have stable vitals signs, and no symptoms of withdrawal   11/7: Goal met: No withdrawal symptoms reported at this time per medical chart.    Attendees: Patient:    Family:    Physician: Dr. Parke Poisson; Dr. Sabra Heck 08/23/2015 9:30 AM  Nursing: Festus Aloe, Mayra Neer, [address], [place], RN 08/23/2015 9:30 AM  Clinical Social Worker: Tilden Fossa, Peri Maris, LCSWA 08/23/2015 9:30 AM  Other: Nira Conn Smart LCSW 08/23/2015 9:30 AM  Other: Ricky Ala, NP 08/23/2015 9:30 AM  Other: Lars Pinks, Case Manager 08/23/2015 9:30 AM  Other:             Scribe for Treatment Team:  Tilden Fossa, MSW, [place] [phone]

## 2015-08-27 ENCOUNTER — Other Ambulatory Visit: Payer: Medicare Other

## 2015-08-30 ENCOUNTER — Encounter (HOSPITAL_COMMUNITY): Payer: Self-pay

## 2015-10-17 ENCOUNTER — Telehealth (HOSPITAL_COMMUNITY): Payer: Self-pay | Admitting: *Deleted

## 2015-10-17 MED ORDER — LOSARTAN POTASSIUM 25 MG PO TABS: 12.5000 mg | ORAL_TABLET | Freq: Every day | ORAL | Status: DC

## 2015-10-17 MED ORDER — FUROSEMIDE 40 MG PO TABS: 40.0000 mg | ORAL_TABLET | ORAL | Status: DC

## 2015-10-17 MED ORDER — SPIRONOLACTONE 25 MG PO TABS: 12.5000 mg | ORAL_TABLET | Freq: Every day | ORAL | Status: DC

## 2015-10-17 MED ORDER — DIGOXIN 250 MCG PO TABS: 0.2500 mg | ORAL_TABLET | Freq: Every day | ORAL | Status: DC

## 2015-10-17 MED ORDER — ATORVASTATIN CALCIUM 20 MG PO TABS: 20.0000 mg | ORAL_TABLET | Freq: Every day | ORAL | Status: DC

## 2015-10-17 MED FILL — DIGOXIN 250 MCG TABLET: 250 | 30 days supply | Qty: 30 | Fill #0

## 2015-10-17 MED FILL — FUROSEMIDE 40 MG TABLET: 40 | 30 days supply | Qty: 15 | Fill #0

## 2015-10-17 MED FILL — LOSARTAN POTASSIUM 25 MG TA: 25 | 30 days supply | Qty: 15 | Fill #0

## 2015-10-17 MED FILL — ATORVASTATIN 20 MG TABLET: 20 | 30 days supply | Qty: 30 | Fill #0

## 2015-10-17 MED FILL — SPIRONOLACTONE 25 MG TABLET: 25 | 30 days supply | Qty: 15 | Fill #0

## 2015-10-17 NOTE — Telephone Encounter (Signed)
Pt called to request refill sent in for all meds, he states he is out of all meds, rx sent in

## 2015-10-23 ENCOUNTER — Telehealth: Payer: Self-pay | Admitting: *Deleted

## 2015-10-23 NOTE — Telephone Encounter (Signed)
Nurse from [place] [facility] called asking if we had a good contact phone number. I called her back and informed that the number we had was the same as the one she provided.

## 2015-11-22 ENCOUNTER — Telehealth (HOSPITAL_COMMUNITY): Payer: Self-pay | Admitting: Cardiology

## 2015-11-22 NOTE — Telephone Encounter (Signed)
Patient called with request to have med refill at the [facility] in [place] requested Coreg- this was discontinued in 01/2015 Coumadin- no INR since last visit to [facility] patient - will need to run this over with provider/PharmD

## 2015-11-26 NOTE — Telephone Encounter (Signed)
PLEASE ADVISE REGARDING REFILLS/ COUMADIN

## 2015-11-28 ENCOUNTER — Telehealth (HOSPITAL_COMMUNITY): Payer: Self-pay | Admitting: Pharmacist

## 2015-11-28 MED ORDER — LOSARTAN POTASSIUM 25 MG PO TABS: 12.5000 mg | ORAL_TABLET | Freq: Every day | ORAL | Status: DC

## 2015-11-28 MED ORDER — SPIRONOLACTONE 25 MG PO TABS: 12.5000 mg | ORAL_TABLET | Freq: Every day | ORAL | Status: DC

## 2015-11-28 NOTE — Telephone Encounter (Signed)
Dennis [name] routed message to [place] in Coumadin Clinic at [facility]. Pt followed by [place] but has not been seen since June 2016. Scheduled Coumadin appt for this Friday at pt's earliest convenience. Will refill Coumadin once we see him in clinic.

## 2015-11-28 NOTE — Telephone Encounter (Signed)
Have refilled one month supplies of losartan and spironolactone for Mr. Vanover since he made an appointment to see Dr. Haroldine Laws on 3/6. He was told that he needs to come to his appointments in order for [place] to continue refilling his medications.   Ruta Hinds. Velva Harman, PharmD, BCPS, CPP Clinical Pharmacist Pager: [phone] Phone: [phone] 11/28/2015 12:38 PM

## 2015-11-30 ENCOUNTER — Emergency Department (HOSPITAL_COMMUNITY): Payer: Medicare Other

## 2015-11-30 ENCOUNTER — Encounter (HOSPITAL_COMMUNITY): Payer: Self-pay | Admitting: Family Medicine

## 2015-11-30 ENCOUNTER — Emergency Department (HOSPITAL_COMMUNITY)
Admission: EM | Admit: 2015-11-30 | Discharge: 2015-11-30 | Disposition: A | Discharge status: Home / Self Care | Payer: Medicare Other | Attending: Emergency Medicine | Admitting: Emergency Medicine

## 2015-11-30 ENCOUNTER — Other Ambulatory Visit: Payer: Self-pay

## 2015-11-30 DIAGNOSIS — E119 Type 2 diabetes mellitus without complications: Secondary | ICD-10-CM | POA: Insufficient documentation

## 2015-11-30 DIAGNOSIS — E7800 Pure hypercholesterolemia, unspecified: Secondary | ICD-10-CM | POA: Insufficient documentation

## 2015-11-30 DIAGNOSIS — Z794 Long term (current) use of insulin: Secondary | ICD-10-CM | POA: Insufficient documentation

## 2015-11-30 DIAGNOSIS — F17210 Nicotine dependence, cigarettes, uncomplicated: Secondary | ICD-10-CM | POA: Diagnosis not present

## 2015-11-30 DIAGNOSIS — R05 Cough: Secondary | ICD-10-CM | POA: Insufficient documentation

## 2015-11-30 DIAGNOSIS — Z8701 Personal history of pneumonia (recurrent): Secondary | ICD-10-CM | POA: Diagnosis not present

## 2015-11-30 DIAGNOSIS — R011 Cardiac murmur, unspecified: Secondary | ICD-10-CM | POA: Insufficient documentation

## 2015-11-30 DIAGNOSIS — Z7984 Long term (current) use of oral hypoglycemic drugs: Secondary | ICD-10-CM | POA: Insufficient documentation

## 2015-11-30 DIAGNOSIS — R0602 Shortness of breath: Secondary | ICD-10-CM | POA: Diagnosis not present

## 2015-11-30 DIAGNOSIS — I5022 Chronic systolic (congestive) heart failure: Secondary | ICD-10-CM | POA: Diagnosis not present

## 2015-11-30 LAB — CBC
HCT: 37.4 % — ABNORMAL LOW (ref 39.0–52.0)
Hemoglobin: 12.2 g/dL — ABNORMAL LOW (ref 13.0–17.0)
MCH: 31.2 pg (ref 26.0–34.0)
MCHC: 32.6 g/dL (ref 30.0–36.0)
MCV: 95.7 fL (ref 78.0–100.0)
Platelets: 229 10*3/uL (ref 150–400)
RBC: 3.91 MIL/uL — ABNORMAL LOW (ref 4.22–5.81)
RDW: 13.8 % (ref 11.5–15.5)
WBC: 10.3 10*3/uL (ref 4.0–10.5)

## 2015-11-30 LAB — BASIC METABOLIC PANEL
Anion gap: 7 (ref 5–15)
BUN: 9 mg/dL (ref 6–20)
CO2: 29 mmol/L (ref 22–32)
Calcium: 9 mg/dL (ref 8.9–10.3)
Chloride: 102 mmol/L (ref 101–111)
Creatinine, Ser: 0.96 mg/dL (ref 0.61–1.24)
GFR calc Af Amer: >60 mL/min (ref >60)
GFR calc non Af Amer: >60 mL/min (ref >60)
Glucose, Bld: 291 mg/dL — ABNORMAL HIGH (ref 65–99)
Potassium: 4 mmol/L (ref 3.5–5.1)
Sodium: 138 mmol/L (ref 135–145)

## 2015-11-30 LAB — BRAIN NATRIURETIC PEPTIDE: B Natriuretic Peptide: 663 pg/mL — ABNORMAL HIGH (ref 0.0–100.0)

## 2015-11-30 LAB — PROTIME-INR
INR: 1.13 (ref 0.00–1.49)
Prothrombin Time: 14.7 seconds (ref 11.6–15.2)

## 2015-11-30 LAB — I-STAT CG4 LACTIC ACID, ED: Lactic Acid, Venous: 1.38 mmol/L (ref 0.5–2.0)

## 2015-11-30 LAB — POC OCCULT BLOOD, ED: Fecal Occult Bld: NEGATIVE

## 2015-11-30 LAB — I-STAT TROPONIN, ED: Troponin i, poc: 0.01 ng/mL (ref 0.00–0.08)

## 2015-11-30 IMAGING — DX DG CHEST 2V
2 series · 2 of 2 positions shown · non-contrast
Comparison: [DATE].

CLINICAL DATA: Shortness of breath, cough.

EXAM:
CHEST  2 VIEW

[chest pa]
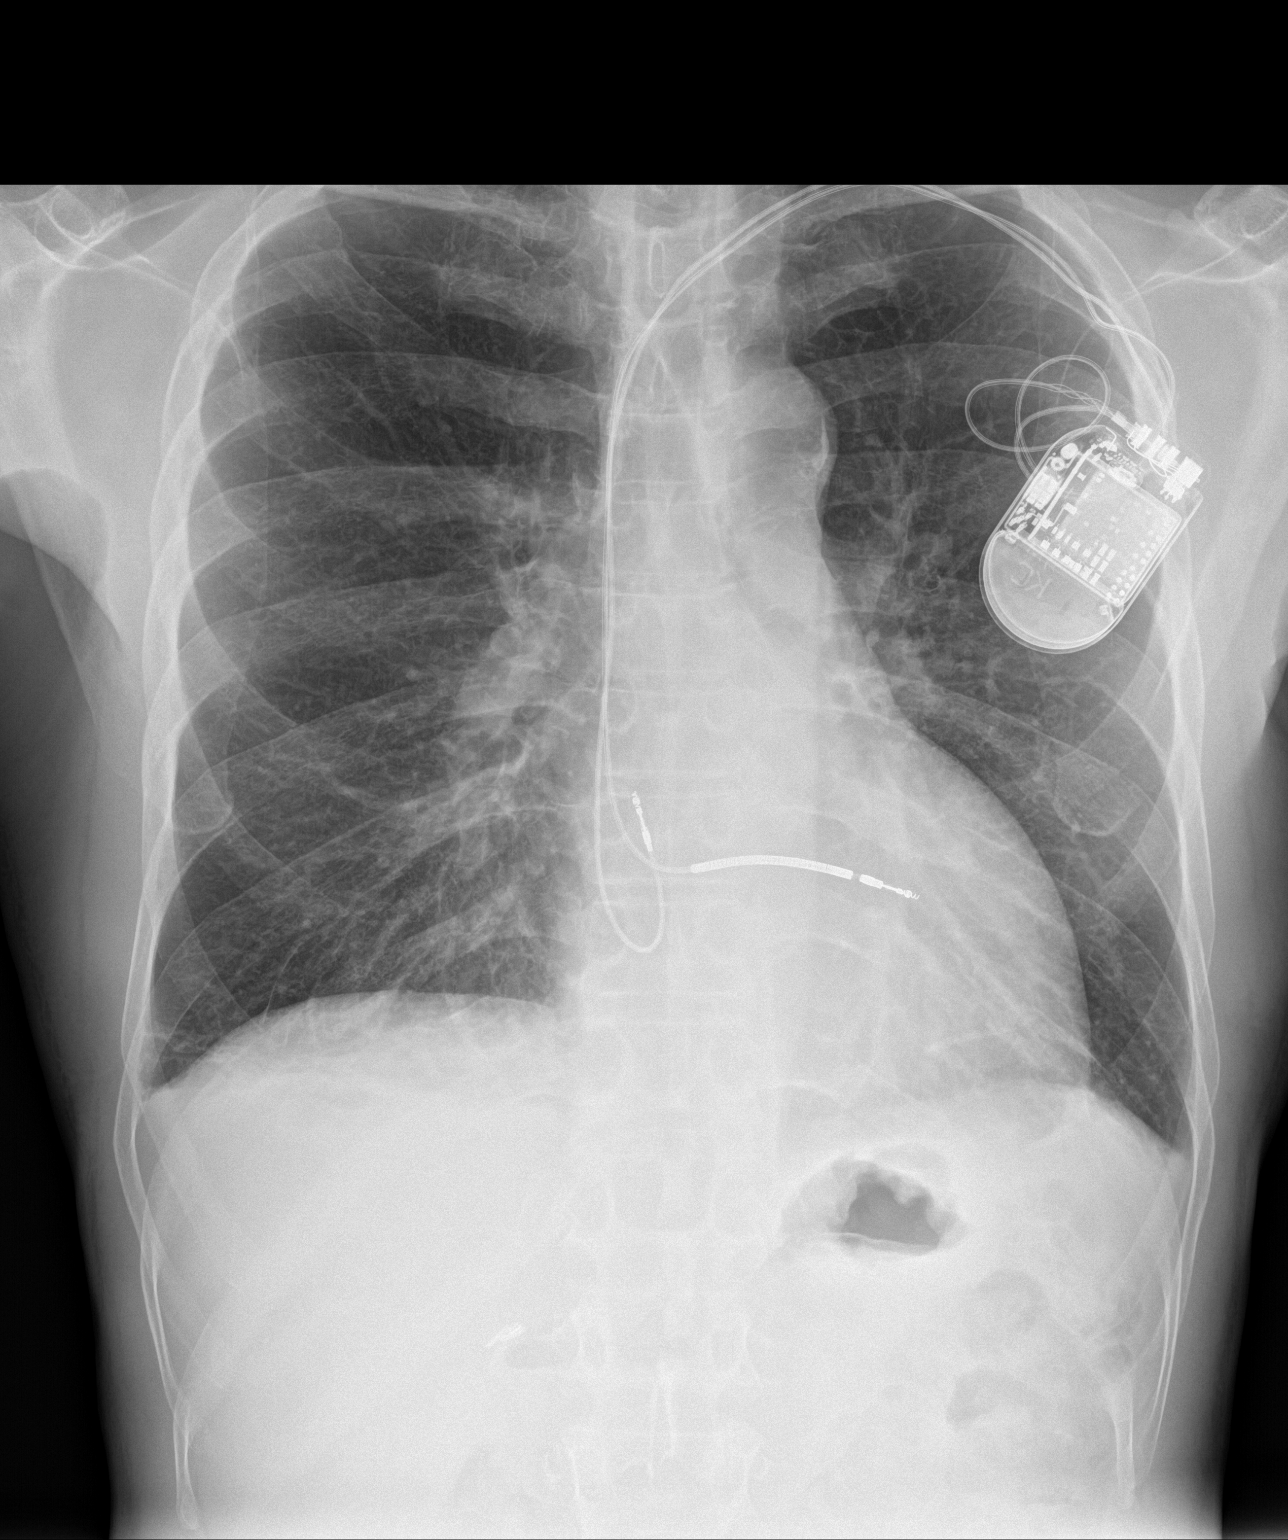

[chest lat]
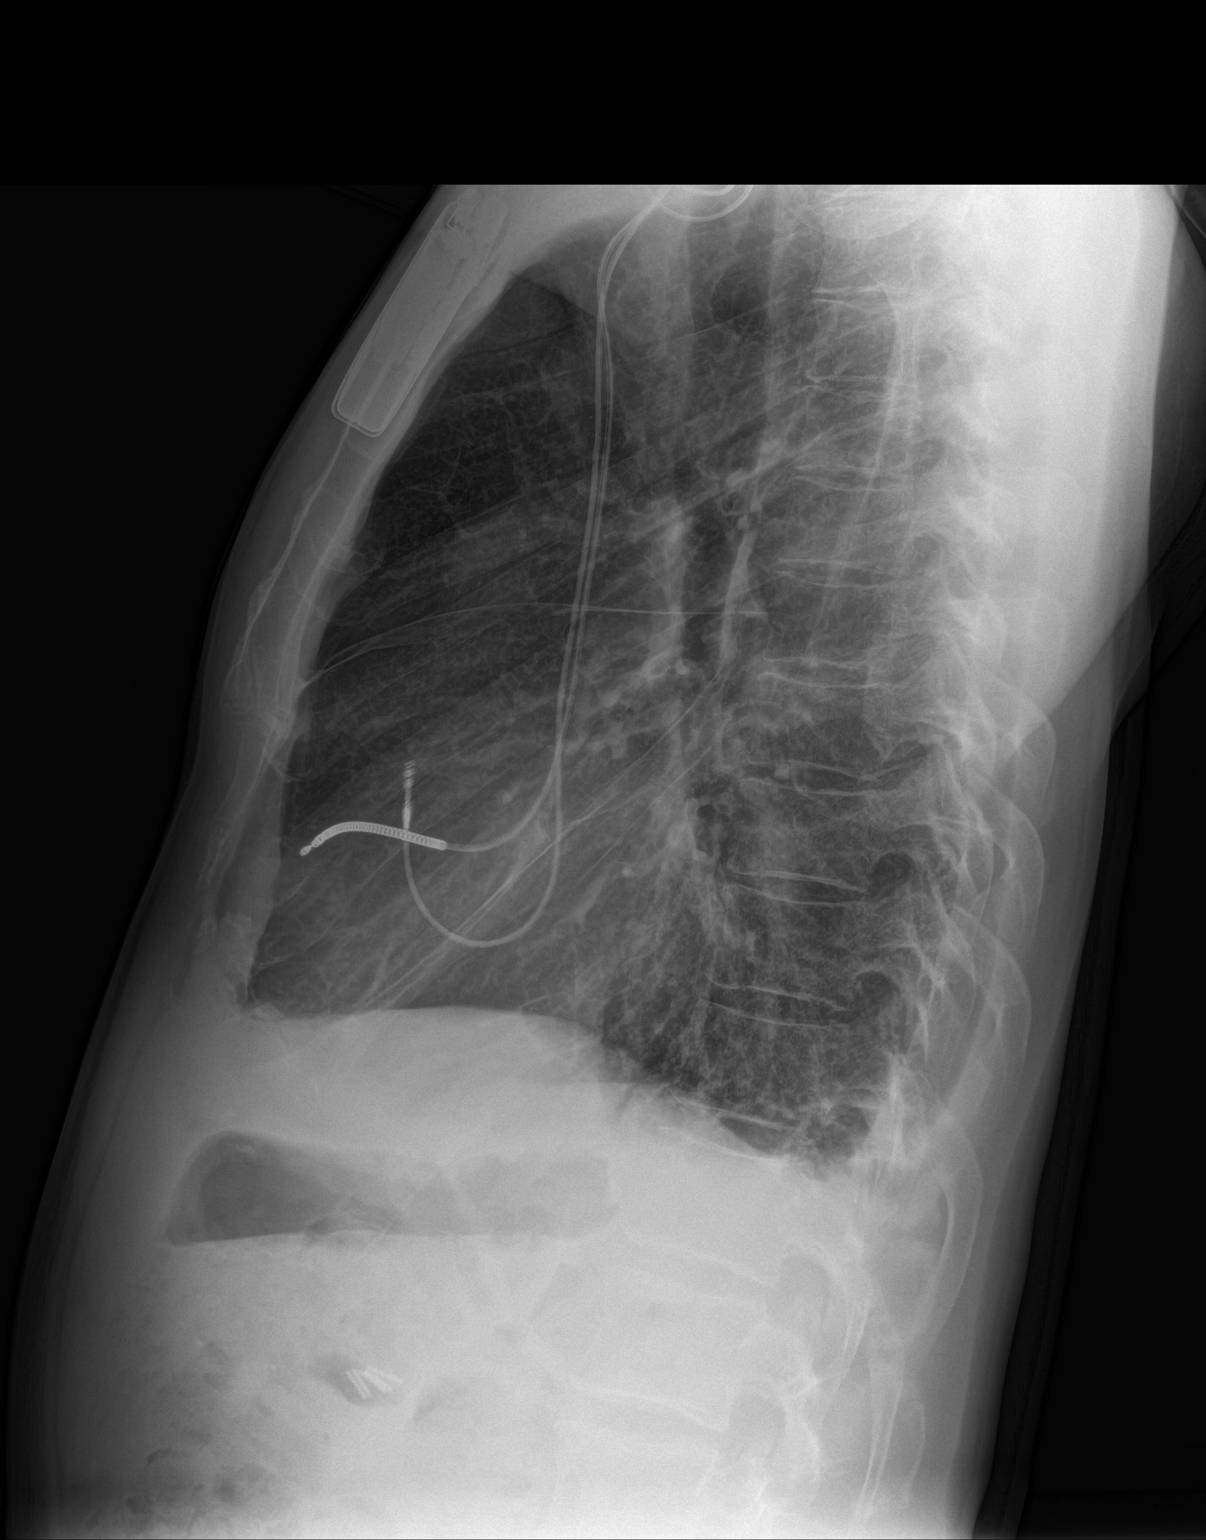

[2 of 2 positions shown; findings below may reference images not displayed]

FINDINGS: Stable cardiomediastinal silhouette. Left-sided pacemaker is
unchanged in position. No pneumothorax is noted. Minimal bilateral
pleural effusions are noted. Left lung is clear. Minimal right
basilar subsegmental atelectasis is noted.
IMPRESSION: Minimal right basilar subsegmental atelectasis. Minimal bilateral
pleural effusions.

## 2015-11-30 MED ORDER — SPIRONOLACTONE 25 MG PO TABS: 12.5000 mg | ORAL_TABLET | Freq: Every day | ORAL | Status: DC

## 2015-11-30 MED ORDER — LOSARTAN POTASSIUM 25 MG PO TABS: 12.5000 mg | ORAL_TABLET | Freq: Every day | ORAL | Status: DC

## 2015-11-30 MED ORDER — WARFARIN SODIUM 7.5 MG PO TABS: ORAL_TABLET | ORAL | Status: DC

## 2015-11-30 MED ORDER — FUROSEMIDE 10 MG/ML IJ SOLN
40.0000 mg | Freq: Once | INTRAMUSCULAR | Status: AC
  Administered 2015-11-30: 40 mg via INTRAVENOUS
  Filled 2015-11-30: qty 4

## 2015-11-30 MED ORDER — CARVEDILOL 3.125 MG PO TABS: 3.1250 mg | ORAL_TABLET | Freq: Two times a day (BID) | ORAL | Status: DC

## 2015-11-30 NOTE — ED Notes (Signed)
Patient is now in X-ray,.

## 2015-11-30 NOTE — ED Notes (Signed)
Pt ambulatory in waiting room speaking with friends. No acute distress noted at this time.

## 2015-11-30 NOTE — ED Notes (Signed)
Pt here for SOB, cough and congestion. sts slight swelling in legs and unable to lay flat.

## 2015-11-30 NOTE — ED Provider Notes (Signed)
  Face-to-face evaluation   History: He presents for shortness of breath with cough productive of yellow sputum. He denies fever. He smokes cigarettes. Takes. He ran out of several of his medications, yesterday. He continues to use his insulin, and trying to watch his carbohydrate intake.  Physical exam: Lungs fair air movement bilaterally, scattered rhonchi, no wheezes or rales.   She has empty pill bottles of Cozaar, spironolactone, warfarin, and losartan  Medical screening examination/treatment/procedure(s) were conducted as a shared visit with non-physician practitioner(s) and myself.  I personally evaluated the patient during the encounter  Daleen Bo, MD 12/01/15 [phone]

## 2015-11-30 NOTE — ED Provider Notes (Signed)
CSN: [id number]     Arrival date & time 11/30/15  1126 History   First MD Initiated Contact with Patient 11/30/15 1225     Chief Complaint  Patient presents with  . Cough  . Shortness of Breath     (Consider location/radiation/quality/duration/timing/severity/associated sxs/prior Treatment) HPI Dennis [name] is a 55 y.o. male with a reported history of heart failure, diabetes, cholesterol, comes in for evaluation of cough and shortness of breath. Patient reports symptoms have been ongoing for the past 2 days. He also reports that he ran out of his Lasix and other blood pressure medications on Wednesday. Denies any overt chest pain. He does report some subjective leg swelling. Also reports more shortness of breath when lying flat. No fevers, chills, nausea or vomiting, dizziness, abdominal pain, urinary symptoms. No other modifying factors.  Past Medical History  Diagnosis Date  . Chronic systolic heart failure (HCC)     a) Mixed ICM/NICM b) RHC (05/2014): RA 2, RV 19/2/3, PA 22/14 (18), PCWP 6, Fick CO/CI: 5.2 / 2.7, PVR 2.3 WU, PA 60% and 64% c) ECHO (05/2014): EF 20-25%, diff HK, akinesis entireanteroseptal myocardium, triv AI, mod MR, LA mod/sev dilated  . Automatic implantable cardioverter-defibrillator in situ   . Heart murmur   . Sleep apnea     "cleared after T&A"  . 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[place])   . Ischemic cardiomyopathy     a) Coronary angiography (12/2008) at [facility]: Lmain: nl, LAD mid 100% stenosis with left to left and right-to-left collaterals to the distal LAD; Lcx: nl, RCA nl.    . Type II diabetes mellitus ([place])   . High cholesterol   . Pneumonia 1988  . Drug abuse and dependence ([place])   . CHF (congestive heart failure) [facility])    Past Surgical History  Procedure Laterality Date  . Forearm fracture surgery Left 1980  . Cardiac defibrillator placement  03/2011  . Cholecystectomy  1994  . Right heart catheterization N/A 05/30/2014    Procedure: RIGHT HEART CATH;   Surgeon: Jolaine Artist, MD;  Location: [facility] CATH LAB;  Service: Cardiovascular;  Laterality: N/A;  . Fracture surgery    . Cardiac catheterization  05/2014  . Tonsillectomy and adenoidectomy  ~ 1998  . Right heart catheterization N/A 02/07/2015    Procedure: RIGHT HEART CATH;  Surgeon: Jolaine Artist, MD;  Location: [facility] CATH LAB;  Service: Cardiovascular;  Laterality: N/A;   Family History  Problem Relation Age of Onset  . Diabetes Mother   . Heart disease Mother   . Diabetes Father   . Prostate cancer Father   . Heart disease Father   . Diabetes Brother   . Diabetes Brother    Social History  Substance Use Topics  . Smoking status: Current Some Day Smoker -- 0.25 packs/day for 31 years    Types: Cigarettes  . Smokeless tobacco: Never Used  . Alcohol Use: 0.0 oz/week    0 Standard drinks or equivalent per week     Comment: 09/21/2014 "might have a drink q 6 /months, if that"    Review of Systems A 10 point review of systems was completed and was negative except for pertinent positives and negatives as mentioned in the history of present illness    Allergies  Metformin and related  Home Medications   Prior to Admission medications   Medication Sig Start Date End Date Taking? Authorizing Provider  atorvastatin (LIPITOR) 20 MG tablet Take 1 tablet (20 mg total) by mouth  daily at 6 PM. 10/17/15  Yes Jolaine Artist, MD  digoxin (LANOXIN) 0.25 MG tablet Take 1 tablet (0.25 mg total) by mouth daily. 10/17/15  Yes Jolaine Artist, MD  furosemide (LASIX) 40 MG tablet Take 1 tablet (40 mg total) by mouth every other day. 10/17/15  Yes Jolaine Artist, MD  glucose blood (ONETOUCH VERIO) test strip Use as instructed 11/30/14  Yes Golden Circle, FNP  Insulin Glargine (LANTUS) 100 UNIT/ML Solostar Pen Inject 30 Units into the skin daily at 10 pm. Dispense pen and needles Patient taking differently: Inject 32 Units into the skin daily at 10 pm. Dispense pen and needles 08/23/15   Yes Derrill Center, NP  ivabradine (CORLANOR) 5 MG TABS tablet Take 5 mg by mouth 2 (two) times daily with a meal.   Yes Historical Provider, MD  QC PEN NEEDLES 31G X 6 MM MISC 1 each by Other route See admin instructions. Use daily with insulin. 08/06/12  Yes Historical Provider, MD  carvedilol (COREG) 3.125 MG tablet Take 1 tablet (3.125 mg total) by mouth 2 (two) times daily with a meal. 11/30/15   Comer Locket, PA-C  insulin aspart (NOVOLOG FLEXPEN) 100 UNIT/ML FlexPen Use as directed. Patient not taking: Reported on 11/30/2015 08/23/15   Derrill Center, NP  losartan (COZAAR) 25 MG tablet Take 0.5 tablets (12.5 mg total) by mouth daily. 11/30/15   Comer Locket, PA-C  mirtazapine (REMERON) 15 MG tablet Take 1 tablet (15 mg total) by mouth at bedtime. Patient not taking: Reported on 11/30/2015 08/23/15   Derrill Center, NP  Multiple Vitamin (MULTIVITAMIN WITH MINERALS) TABS tablet Take 1 tablet by mouth daily. Patient not taking: Reported on 11/30/2015 08/23/15   Derrill Center, NP  PARoxetine (PAXIL) 20 MG tablet Take 1 tablet (20 mg total) by mouth at bedtime. Patient not taking: Reported on 11/30/2015 08/23/15   Derrill Center, NP  spironolactone (ALDACTONE) 25 MG tablet Take 0.5 tablets (12.5 mg total) by mouth daily. 11/30/15   Comer Locket, PA-C  thiamine 100 MG tablet Take 1 tablet (100 mg total) by mouth daily. Patient not taking: Reported on 11/30/2015 08/23/15   Derrill Center, NP  warfarin (COUMADIN) 7.5 MG tablet Take as directed by coumadin clinic 11/30/15   Comer Locket, PA-C   BP 104/72 mmHg  Pulse 105  Temp(Src) 99.3 F (37.4 C) (Oral)  Resp 21  SpO2 100% Physical Exam  Constitutional: He is oriented to person, place, and time. He appears well-developed and well-nourished.  Overall well appearing African-American male. Sitting flat in exam bed in no apparent distress.  HENT:  Head: Normocephalic and atraumatic.  Mouth/Throat: Oropharynx is clear and moist.   Eyes: Conjunctivae are normal. Pupils are equal, round, and reactive to light. Right eye exhibits no discharge. Left eye exhibits no discharge. No scleral icterus.  Neck: Neck supple.  Cardiovascular: Normal rate, regular rhythm and normal heart sounds.   Pulmonary/Chest: Effort normal and breath sounds normal. No respiratory distress. He has no wheezes. He has no rales.  Mild, scattered rhonchi with no other adventitious lung sounds.  Abdominal: Soft. There is no tenderness.  Musculoskeletal: Normal range of motion. He exhibits no edema or tenderness.  Neurological: He is alert and oriented to person, place, and time.  Cranial Nerves II-XII grossly intact  Skin: Skin is warm and dry. No rash noted.  Psychiatric: He has a normal mood and affect.  Nursing note and vitals reviewed.   ED Course  Procedures (including critical care time) Labs Review Labs Reviewed  BASIC METABOLIC PANEL - Abnormal; Notable for the following:    Glucose, Bld 291 (*)    All other components within normal limits  CBC - Abnormal; Notable for the following:    RBC 3.91 (*)    Hemoglobin 12.2 (*)    HCT 37.4 (*)    All other components within normal limits  BRAIN NATRIURETIC PEPTIDE - Abnormal; Notable for the following:    B Natriuretic Peptide 663.0 (*)    All other components within normal limits  PROTIME-INR  I-STAT TROPOININ, ED  I-STAT CG4 LACTIC ACID, ED  POC OCCULT BLOOD, ED  I-STAT CG4 LACTIC ACID, ED    Imaging Review Dg Chest 2 View  11/30/2015  CLINICAL DATA:  Shortness of breath, cough. EXAM: CHEST  2 VIEW COMPARISON:  July 16, 2015. FINDINGS: Stable cardiomediastinal silhouette. Left-sided pacemaker is unchanged in position. No pneumothorax is noted. Minimal bilateral pleural effusions are noted. Left lung is clear. Minimal right basilar subsegmental atelectasis is noted. IMPRESSION: Minimal right basilar subsegmental atelectasis. Minimal bilateral pleural effusions. Electronically Signed    By: Marijo Conception, M.D.   On: 11/30/2015 12:23   I have personally reviewed and evaluated these images and lab results as part of my medical decision-making.   EKG Interpretation   Date/Time:  Friday November 30 2015 11:34:38 EST Ventricular Rate:  110 PR Interval:  184 QRS Duration: 90 QT Interval:  334 QTC Calculation: 452 R Axis:   -16 Text Interpretation:  Sinus tachycardia Biatrial enlargement Anterior  infarct , age undetermined Abnormal ECG since last tracing no significant  change Confirmed by [facility]  MD, ELLIOTT [phone]) on 11/30/2015 4:13:35 PM     Meds given in ED:  Medications  furosemide (LASIX) injection 40 mg (40 mg Intravenous Given 11/30/15 1447)    Discharge Medication List as of 11/30/2015  4:23 PM     Filed Vitals:   11/30/15 1415 11/30/15 1430 11/30/15 1600 11/30/15 1615  BP: 101/84 92/66 105/79 104/72  Pulse: 101 110 104 105  Temp:      TempSrc:      Resp: '13 20 24 21  '$ SpO2: 97% 98% 97% 100%    MDM  Jahdiel Adduci is a 55 y.o. male history of heart failure, coronary artery disease comes in for evaluation of shortness of breath and cough with yellow sputum. Patient reports he is to follow up with his cardiologist, Dr. Haroldine Laws on Tuesday. Has run out of spironolactone, Coumadin, carvedilol and losartan-Will refill prescription and have patient follow-up for medication reconciliation. On arrival, he complains of shortness of breath and cough that has been ongoing for several years. He reports that he ran out of his furosemide 2 days ago and has not taken it since. Upon medication review, he does have his furosemide and still has roughly 15 tablets left. On arrival, he is mildly tachycardic, oxygen saturation is 100% on room air. He is afebrile. He admits that he has not been drinking very much water. Physical exam is grossly unremarkable with mild, scattered rhonchi but other no adventitious lung sounds or pedal edema. BNP is slightly elevated at 663, chest  x-ray shows minimal bilateral pleural effusions. Overall picture suggests mild fluid overload, given 40 mg of Lasix in the ED. Ambulated throughout the ED without difficulty and kept oxygen saturations above 94%. Patient states he feels better in the emergency department and feels like he can go home. Patient states he has  a INR check appointment this afternoon, INR in emergency department is subtherapeutic at 1.13. Patient states he will go to the Coumadin clinic for reconciliation. Patient without chest pain, hypoxia, low suspicion for PE as he reports this is his typical presentation for CHF exacerbation. Discussed ED course, results, discharge instructions as well as strict return precautions with patient, including worsening shortness of breath, chest pain, dizziness, weakness, fever. If experiencing worsening symptoms, may need further investigatory testing with possible CT of chest. Patient verbalizes understanding and agrees with this plan as well as subsequent discharge. Prior to patient discharge, I discussed and reviewed this case with Dr. Eulis Foster who agrees with above plan  Final diagnoses:  Shortness of breath        Comer Locket, PA-C 12/01/15 0745  Daleen Bo, MD 12/01/15 [phone]

## 2015-11-30 NOTE — ED Notes (Signed)
PA made aware patient is becoming Tachardia HR105 RR 25 recommendation lactic acid.

## 2015-11-30 NOTE — ED Notes (Signed)
PA Ambulated patient.

## 2015-11-30 NOTE — Discharge Instructions (Signed)
You were evaluated in the ED today for your cough and shortness of breath. There does not appear to be an emergent cause for your symptoms at this time. However, it is important to follow up with your cardiologist on Tuesday for your regularly scheduled appointment and for your medication management. Your exam, labs and x-rays were reassuring. It is important that you return to the ED for any new or worsening symptoms including worsening shortness of breath, dizziness, chest pain.

## 2015-11-30 NOTE — ED Notes (Signed)
[facility] and X-ray unable to locate patient.

## 2015-12-03 NOTE — Progress Notes (Signed)
Patient ID: Tyronn Golda, male   DOB: Feb 24, 1961, 55 y.o.   MRN: [id number]  ADVANCED HEART FAILURE CLINIC NOTE  Patient ID: Jaxon Flatt, male   DOB: 12-22-1960, 55 y.o.   MRN: [id number]  HPI: Jaydan is a 55 y/o male with h/o DM2, polysubstance use (tobacco, cocaine), mixed ICM/NICM and chronic systolic HF due to ventricular noncompaction- on chronic coumadin, noncompliance   Admitted to [facility] in 4/16 with acute CVA and with L sided weakness. CT showed R MCA stroke. Did not receive TPA due to delay in arrival. Had ECHO that showed EF 10-15% mild RV HK. No obvious LV clot. As he improved he was transferred to CIR but then developed hypotension and transferred back to HF ward. Underwent RHC on 4/27. Filling pressures mildly elevated CO ok. Diuresed gently. Meds limited by SBP in 80s. Could only tolerate ivabradine 7.5 bid, digoxin and spiro 12.5. Lasix and carvedilol stopped.   Since discharge doing better. Strength improved. Able to ambulate without much difficulty. No orthopnea, PND or dizziness. Weight stable at 155-157. Was unable to fill ivabradine.    [place] 02/07/15 RA = 3 RV = 50/4/7 PA = 55/14 (34) PCW = 22 Fick cardiac output/index = 4.6/2.4 Thermodilution CO/CI = 4.8/2.5 PVR = 2.5 WU SVR = 1443 FA sat = 96% PA sat = 51%, 56%, 61% Noninvasive Ao pressure = 106/74 (86)   Seen in ED 11/30/15 for SOB and had several medications filled.   He presents today for HF follow up.  Recently called having run out of his medications. Asked for refills, though has not been seen in nearly a year (No shows). Told we would give one month supply and he would need to follow up for further. States he ran out of medicines Thursday/Friday. SOB much better back on medicines.  Walked track at [employer] 16 times (started getting SOB around 11 repetitions). No further orthopnea/PND now back on medicines.  Continues to smoke 10 cigarettes a day.  No further drug use, states he has been clean since December.  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   Corvue: Thoracic impedence down improved slightly back on medications. No VT/VF. No AT/AF alerts.    Past Medical History  Diagnosis Date  . Chronic systolic heart failure (HCC)     a) Mixed ICM/NICM b) RHC (05/2014): RA 2, RV 19/2/3, PA 22/14 (18), PCWP 6, Fick CO/CI: 5.2 / 2.7, PVR 2.3 WU, PA 60% and 64% c) ECHO (05/2014): EF 20-25%, diff HK, akinesis entireanteroseptal myocardium, triv AI, mod MR, LA mod/sev dilated  . Automatic implantable cardioverter-defibrillator in situ   . Heart murmur   . Sleep apnea     "cleared after T&A"  . 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[place])   . Ischemic cardiomyopathy     a) Coronary angiography (12/2008) at [facility]: Lmain: nl, LAD mid 100% stenosis with left to left and right-to-left collaterals to the distal LAD; Lcx: nl, RCA nl.    . Type II diabetes mellitus ([place])   . High cholesterol   . Pneumonia 1988  . Drug abuse and dependence ([place])   . CHF (congestive heart failure) ([place])     Current Outpatient Prescriptions  Medication Sig Dispense Refill  . atorvastatin (LIPITOR) 20 MG tablet Take 1 tablet (20 mg total) by mouth daily at 6 PM. 30 tablet 1  . digoxin (LANOXIN) 0.25 MG tablet Take 1 tablet (0.25 mg total) by mouth daily. 30 tablet 1  .  furosemide (LASIX) 40 MG tablet Take 40 mg by mouth daily.    Marland Kitchen glucose blood (ONETOUCH VERIO) test strip Use as instructed 100 each 12  . Insulin Glargine (LANTUS SOLOSTAR) 100 UNIT/ML Solostar Pen Inject 32 Units into the skin daily at 10 pm.    . ivabradine (CORLANOR) 5 MG TABS tablet Take 5 mg by mouth daily.     . QC PEN NEEDLES 31G X 6 MM MISC 1 each by Other route See admin instructions. Use daily with insulin.    . carvedilol (COREG) 3.125 MG tablet Take 3.125 mg by mouth 2 (two) times daily with a meal.    . losartan (COZAAR) 25 MG tablet Take 0.5 tablets (12.5 mg total) by mouth daily. (Patient not taking: Reported on 12/04/2015) 15 tablet 0  .  spironolactone (ALDACTONE) 25 MG tablet Take 0.5 tablets (12.5 mg total) by mouth daily. (Patient not taking: Reported on 12/04/2015) 15 tablet 0  . warfarin (COUMADIN) 7.5 MG tablet Take as directed by coumadin clinic (Patient not taking: Reported on 12/04/2015) 30 tablet 1   No current facility-administered medications for this encounter.    No Known Allergies    Social History   Social History  . Marital Status: Single    Spouse Name: N/A  . Number of Children: 1  . Years of Education: 14   Occupational History  . Disabled    Social History Main Topics  . Smoking status: Current Some Day Smoker -- 0.25 packs/day for 31 years    Types: Cigarettes  . Smokeless tobacco: Never Used  . Alcohol Use: 0.0 oz/week    0 Standard drinks or equivalent per week     Comment: 09/21/2014 "might have a drink q 6 /months, if that"  . Drug Use: Yes    Special: Cocaine     Comment: 09/21/2014 "last cocaine ~ 1 1/2 months ago"  . Sexual Activity: Yes   Other Topics Concern  . Not on file   Social History Narrative   Lives in [place] with his mother. No pets. Fun: movies, sports, daughter.    Denies religious beliefs that would effect health care.       Family History  Problem Relation Age of Onset  . Diabetes Mother   . Heart disease Mother   . Diabetes Father   . Prostate cancer Father   . Heart disease Father   . Diabetes Brother   . Diabetes Brother     Filed Vitals:   12/04/15 1342  BP: 104/90  Pulse: 97  Weight: 158 lb (71.668 kg)  SpO2: 98%   Wt Readings from Last 3 Encounters:  12/04/15 158 lb (71.668 kg)  08/17/15 143 lb (64.864 kg)  08/16/15 150 lb (68.04 kg)    PHYSICAL EXAM: General:  Well appearing. No respiratory difficulty HEENT: normal Neck: supple. JVD 6-7. Carotids 2+ bilat; no bruits. No thyromegaly or nodule noted.  Cor: PMI laterally displaced. Regular. Mildly tachy +s3 Lungs: CTAB, normal effort.  Abdomen: soft, NT, ND, no HSM. No bruits or  masses. +BS  Extremities: no cyanosis, clubbing, rash, edema Neuro: alert & oriented x 3, cranial nerves grossly intact. moves all 4 extremities w/o difficulty. Affect pleasant.   ASSESSMENT & PLAN: 1. Chronic Systolic Heart Failure- ECHO EF 10-15% with non compaction s/p ICD - St Jude -- Volume status stable on exam. Was up on corevue, improving back on medications.  -- NYHA II symptoms. -- Increase ivabradine to 5 mg BID. 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  -- Resume coreg 3.125 mg BID.  -- Continue lasix 40 mg daily. BMET/BNP today.  -- Continue digoxin 0.25 mcg, check level today.  -- Hold Spiro and losartan for now.   -- Dr. Tanna Furry office following ICD. Will set up for follow up at next visit.  -- Will need close f/u as HF tenuous with non-compliance.  2. Hx of embolic CVA -- INR 1.54 0/08/67 in ED. Missed his Coumadin Clinic appointment. Needs to reschedule 3. CAD Coronary angiography (12/2008) with mid LAD totally occluded and collateralized. - Continue atorvastatin 20 mg daily. No ASA he is on coumadin for noncompaction.  4. DMII  5. Polysubstance abuse  - Clean since 09/16/16 6. Tobacco Abuse - Smokes 10 cigarrettes a day, encouraged to quit. 7. Non-compliance - Major barrier to patient care.  - Stressed importance of adherence to medications and returning for follow up visits.   Med changes above.  Slowly resuming HF regimen. BMET/BNP/Dig level today. Will have see Coumadin Clinic ASAP.  Follow up with Pharm-D for med titration x 1 week.  Follow up PA side 2 weeks.  Shirley Friar, PA-C 2:03 PM

## 2015-12-04 ENCOUNTER — Ambulatory Visit (HOSPITAL_COMMUNITY)
Admission: RE | Admit: 2015-12-04 | Discharge: 2015-12-04 | Disposition: A | Discharge status: Home / Self Care | Payer: Medicare Other | Source: Ambulatory Visit | Attending: Internal Medicine | Admitting: Internal Medicine

## 2015-12-04 ENCOUNTER — Ambulatory Visit (INDEPENDENT_AMBULATORY_CARE_PROVIDER_SITE_OTHER): Payer: Medicare Other

## 2015-12-04 ENCOUNTER — Encounter (HOSPITAL_COMMUNITY): Payer: Self-pay | Admitting: Cardiology

## 2015-12-04 VITALS — BP 104/90 | HR 97 | Wt 158.0 lb

## 2015-12-04 DIAGNOSIS — E7800 Pure hypercholesterolemia, unspecified: Secondary | ICD-10-CM | POA: Insufficient documentation

## 2015-12-04 DIAGNOSIS — Z794 Long term (current) use of insulin: Secondary | ICD-10-CM | POA: Diagnosis not present

## 2015-12-04 DIAGNOSIS — E119 Type 2 diabetes mellitus without complications: Secondary | ICD-10-CM | POA: Insufficient documentation

## 2015-12-04 DIAGNOSIS — I428 Other cardiomyopathies: Secondary | ICD-10-CM

## 2015-12-04 DIAGNOSIS — Z833 Family history of diabetes mellitus: Secondary | ICD-10-CM | POA: Diagnosis not present

## 2015-12-04 DIAGNOSIS — Z7901 Long term (current) use of anticoagulants: Secondary | ICD-10-CM | POA: Diagnosis not present

## 2015-12-04 DIAGNOSIS — I2510 Atherosclerotic heart disease of native coronary artery without angina pectoris: Secondary | ICD-10-CM | POA: Insufficient documentation

## 2015-12-04 DIAGNOSIS — Z9119 Patient's noncompliance with other medical treatment and regimen: Secondary | ICD-10-CM | POA: Insufficient documentation

## 2015-12-04 DIAGNOSIS — Z8249 Family history of ischemic heart disease and other diseases of the circulatory system: Secondary | ICD-10-CM | POA: Insufficient documentation

## 2015-12-04 DIAGNOSIS — Z8673 Personal history of transient ischemic attack (TIA), and cerebral infarction without residual deficits: Secondary | ICD-10-CM | POA: Diagnosis not present

## 2015-12-04 DIAGNOSIS — Z79899 Other long term (current) drug therapy: Secondary | ICD-10-CM | POA: Diagnosis not present

## 2015-12-04 DIAGNOSIS — I10 Essential (primary) hypertension: Secondary | ICD-10-CM | POA: Diagnosis not present

## 2015-12-04 DIAGNOSIS — F1910 Other psychoactive substance abuse, uncomplicated: Secondary | ICD-10-CM | POA: Diagnosis not present

## 2015-12-04 DIAGNOSIS — F17210 Nicotine dependence, cigarettes, uncomplicated: Secondary | ICD-10-CM | POA: Diagnosis not present

## 2015-12-04 DIAGNOSIS — Z8042 Family history of malignant neoplasm of prostate: Secondary | ICD-10-CM | POA: Diagnosis not present

## 2015-12-04 DIAGNOSIS — Z720 Tobacco use: Secondary | ICD-10-CM | POA: Diagnosis not present

## 2015-12-04 DIAGNOSIS — I5022 Chronic systolic (congestive) heart failure: Secondary | ICD-10-CM | POA: Insufficient documentation

## 2015-12-04 DIAGNOSIS — Z95810 Presence of automatic (implantable) cardiac defibrillator: Secondary | ICD-10-CM | POA: Insufficient documentation

## 2015-12-04 DIAGNOSIS — Z91199 Patient's noncompliance with other medical treatment and regimen due to unspecified reason: Secondary | ICD-10-CM

## 2015-12-04 LAB — BASIC METABOLIC PANEL
Anion gap: 9 (ref 5–15)
BUN: 11 mg/dL (ref 6–20)
CO2: 26 mmol/L (ref 22–32)
Calcium: 8.9 mg/dL (ref 8.9–10.3)
Chloride: 104 mmol/L (ref 101–111)
Creatinine, Ser: 0.9 mg/dL (ref 0.61–1.24)
GFR calc Af Amer: >60 mL/min (ref >60)
GFR calc non Af Amer: >60 mL/min (ref >60)
Glucose, Bld: 350 mg/dL — ABNORMAL HIGH (ref 65–99)
Potassium: 4.1 mmol/L (ref 3.5–5.1)
Sodium: 139 mmol/L (ref 135–145)

## 2015-12-04 LAB — POCT INR: INR: 1.1

## 2015-12-04 LAB — BRAIN NATRIURETIC PEPTIDE: B Natriuretic Peptide: 587 pg/mL — ABNORMAL HIGH (ref 0.0–100.0)

## 2015-12-04 LAB — DIGOXIN LEVEL: Digoxin Level: <0.2 ng/mL — ABNORMAL LOW (ref 0.8–2.0)

## 2015-12-04 MED ORDER — FUROSEMIDE 40 MG PO TABS: 40.0000 mg | ORAL_TABLET | Freq: Every day | ORAL | Status: DC

## 2015-12-04 MED ORDER — IVABRADINE HCL 5 MG PO TABS: 5.0000 mg | ORAL_TABLET | Freq: Two times a day (BID) | ORAL | Status: DC

## 2015-12-04 MED ORDER — ATORVASTATIN CALCIUM 20 MG PO TABS: 20.0000 mg | ORAL_TABLET | Freq: Every day | ORAL | Status: DC

## 2015-12-04 MED ORDER — CARVEDILOL 3.125 MG PO TABS: 3.1250 mg | ORAL_TABLET | Freq: Two times a day (BID) | ORAL | Status: DC

## 2015-12-04 MED ORDER — WARFARIN SODIUM 7.5 MG PO TABS: ORAL_TABLET | ORAL | Status: DC

## 2015-12-04 MED FILL — FUROSEMIDE 40 MG TABLET: 40 | 30 days supply | Qty: 30 | Fill #0

## 2015-12-04 MED FILL — ATORVASTATIN 20 MG TABLET: 20 | 30 days supply | Qty: 30 | Fill #0

## 2015-12-04 MED FILL — CARVEDILOL 3.125 MG TABLET: 3.125 | 30 days supply | Qty: 60 | Fill #0

## 2015-12-04 MED FILL — WARFARIN SODIUM 7.5 MG TAB: 7.5 | 30 days supply | Qty: 35 | Fill #0

## 2015-12-04 NOTE — Patient Instructions (Signed)
STOP Losartan STOP Spironolactone  RESTART Carvedilol 3.25 mg, one tab twice a day INCREASE Corlanor 5 mg, one tab daily CONTINUE Lasix 40 mg, one tab daily  You have been referred to coumadin clinic 12/04/2015 @ 3:30pm  Your physician recommends that you schedule a follow-up appointment in: 1 week in the pharmacy clinic with Doroteo Bradford and in 2 weeks in the In the [facility]

## 2015-12-04 NOTE — Progress Notes (Signed)
Advanced Heart Failure Medication Review by a Pharmacist  Does the patient  feel that his/her medications are working for him/her?  yes  Has the patient been experiencing any side effects to the medications prescribed?  no  Does the patient measure his/her own blood pressure or blood glucose at home?  yes   Does the patient have any problems obtaining medications due to transportation or finances?   no  Understanding of regimen: fair Understanding of indications: fair Potential of compliance: poor Patient understands to avoid NSAIDs. Patient understands to avoid decongestants.  Issues to address at subsequent visits: Compliance   Pharmacist comments:  Dennis [name] is a pleasant 55 yo M presenting with his medication bottles most of which he has run out of. He is currently taking furosemide 40 mg daily instead of QOD and Corlanor 5 mg daily instead of BID. He was out of his losartan, spironolactone, carvedilol and warfarin. We discussed the need for close Coumadin f/u with his h/o embolic stroke 2/2 Afib. We also discussed the reasoning behind each of his medications and the importance of consistent use and he verbalized understanding.   Ruta Hinds. Velva Harman, PharmD, BCPS, CPP Clinical Pharmacist Pager: [phone] Phone: [phone] 12/04/2015 2:14 PM      Time with patient: 16 minutes Preparation and documentation time: 2 minutes Total time: 18 minutes

## 2015-12-07 ENCOUNTER — Encounter (HOSPITAL_COMMUNITY): Payer: Self-pay | Admitting: Pharmacist

## 2015-12-12 ENCOUNTER — Ambulatory Visit (INDEPENDENT_AMBULATORY_CARE_PROVIDER_SITE_OTHER): Payer: Medicare Other | Admitting: Pharmacist

## 2015-12-12 ENCOUNTER — Ambulatory Visit (HOSPITAL_COMMUNITY)
Admission: RE | Admit: 2015-12-12 | Discharge: 2015-12-12 | Disposition: A | Discharge status: Home / Self Care | Payer: Medicare Other | Source: Ambulatory Visit | Attending: Cardiology | Admitting: Cardiology

## 2015-12-12 ENCOUNTER — Encounter: Payer: Self-pay | Admitting: *Deleted

## 2015-12-12 DIAGNOSIS — Z79899 Other long term (current) drug therapy: Secondary | ICD-10-CM | POA: Diagnosis not present

## 2015-12-12 DIAGNOSIS — Z9119 Patient's noncompliance with other medical treatment and regimen: Secondary | ICD-10-CM | POA: Insufficient documentation

## 2015-12-12 DIAGNOSIS — Z95810 Presence of automatic (implantable) cardiac defibrillator: Secondary | ICD-10-CM | POA: Diagnosis not present

## 2015-12-12 DIAGNOSIS — E119 Type 2 diabetes mellitus without complications: Secondary | ICD-10-CM | POA: Diagnosis not present

## 2015-12-12 DIAGNOSIS — I5022 Chronic systolic (congestive) heart failure: Secondary | ICD-10-CM | POA: Diagnosis present

## 2015-12-12 DIAGNOSIS — I428 Other cardiomyopathies: Secondary | ICD-10-CM | POA: Diagnosis not present

## 2015-12-12 DIAGNOSIS — F17210 Nicotine dependence, cigarettes, uncomplicated: Secondary | ICD-10-CM | POA: Insufficient documentation

## 2015-12-12 DIAGNOSIS — Z8673 Personal history of transient ischemic attack (TIA), and cerebral infarction without residual deficits: Secondary | ICD-10-CM | POA: Insufficient documentation

## 2015-12-12 DIAGNOSIS — I2510 Atherosclerotic heart disease of native coronary artery without angina pectoris: Secondary | ICD-10-CM | POA: Diagnosis not present

## 2015-12-12 DIAGNOSIS — Z7901 Long term (current) use of anticoagulants: Secondary | ICD-10-CM

## 2015-12-12 LAB — POCT INR: INR: 3.4

## 2015-12-12 MED ORDER — LOSARTAN POTASSIUM 25 MG PO TABS: 25.0000 mg | ORAL_TABLET | Freq: Every day | ORAL | Status: DC

## 2015-12-12 NOTE — Patient Instructions (Signed)
Start Losartan '25mg'$  (1 tablet) every evening.  Please keep your appointment on 12/18/2015

## 2015-12-12 NOTE — Progress Notes (Signed)
Patient ID: [name], male   DOB: Sep 17, 1961, 55 y.o.   MRN: [id number]  HPI:  Dennis [name] is a 55 y/o AA male with h/o DM2, polysubstance use (tobacco, cocaine), mixed ICM/NICM and chronic systolic HF due to ventricular noncompaction- on chronic coumadin, noncompliance. Admitted to [facility] in 4/16 with acute CVA and with L sided weakness. CT showed R MCA stroke. Continues to smoke 10 cigarettes a day. No further drug use, has been clean since December.   At last HF clinic visit on 2/21, he was only taking digoxin and ivabradine once daily so his ivabradine was increased to 5 mg BID, coreg was resumed at 3.125 mg BID and spiro and losartan were held since he had been noncompliant with all of his medications.   . Shortness of breath/dyspnea on exertion? yes - can't make it up 1 flight of steps without getting winded . Orthopnea/PND? Yes - 1 pillow (stable) . Edema? no . Lightheadedness/dizziness? no . Daily weights at home? Yes (stable 156-158 lb) . Blood pressure/heart rate monitoring at home? no . Following low-sodium/fluid-restricted diet? No - eating McDoubles at least 2x/wk and drinking lots of water    HF Medications: Carvedilol 3.125 mg PO BID Digoxin 0.25 mg PO daily Furosemide 40 mg PO daily  Corlanor 5 mg PO BID - ran out on Saturday   Has the patient been experiencing any side effects to the medications prescribed?  no  Does the patient have any problems obtaining medications due to transportation or finances?   no  Understanding of regimen: good Understanding of indications: good Potential of compliance: fair Patient understands to avoid NSAIDs. Patient understands to avoid decongestants.    Pertinent Lab Values: . 12/04/15 - Serum creatinine 0.90 (CrCl ~80 ml/min), CO2 26, Potassium 4.1, Sodium 139, BNP 587, Digoxin <0.2   Vital Signs: . Weight: 146 lb (last visit weight: 158 lb) . Blood pressure: 100/70 mmHg  . Heart rate: 102 bpm    REDS VEST READING= 33 CHEST  RULER=19  VEST FITTING TASKS: POSTURE=standing HEIGHT MARKER=tall CENTER STRIP=aligned   Heart Failure Assessment & Plan: . Ejection fraction: 10-15% . NYHA symptom class: II . Based on clinical presentation, vital signs, and recent labs, will recommend to start losartan 25 mg PO daily   Summary: 1. Chronic Systolic Heart Failure- ECHO EF 10-15% with non compaction s/p ICD - St Jude  -- During today's visit, all of Dennis [name] HF medications were reviewed with him at length and he verbalized understanding of the importance of consistent utilization. I congratulated him on his improved compliance although he does admit to missing 3-4 of his pm doses of BID medications. He is still eating high sodium foods (i.e.  McDoubles) but states that he does not add salt to any of his meals and is attempting to eat more baked lean meats. He also states that he likely drinks more than 2L of water a day 2/2 polydipsia. I re-emphasized the importance of a low sodium diet and cutting back on his fluid intake to <2L/day. Unsure why his weight is down 12 lbs today from 2 weeks ago. Based on ReDS vest reading and denial of symptoms, he does not seem to be excessively diuresed. I am concerned, however, that some of this may be due to poor BG control and encouraged him to keep his appointment with his PCP on Friday. After discussion with Dr. Haroldine Laws, have recommended that patient restart losartan 25 mg PO every evening. He did run out of his Corlanor  last Saturday as his insurance denied coverage. The appeal is in process and in the meantime, I will provide him with samples.  -- Volume status stable on exam.  -- NYHA II symptoms. -- Continue Corlanor 5 mg BID. Working on appeal. -- Continue coreg 3.125 mg BID.  -- Continue lasix 40 mg daily.  -- Continue digoxin 0.25 mcg. -- Resume losartan 25 mg daily.  -- Dr. Tanna Furry office following ICD. Will set up for follow up at next visit.  -- Will need close f/u  as HF tenuous with non-compliance.  2. Hx of embolic CVA -- INR 1.1 at Coumadin clinic visit on 2/21. F/u Coumadin clinic visit today. Encouraged continued compliance. 3. CAD Coronary angiography (12/2008) with mid LAD totally occluded and collateralized. - Continue atorvastatin 20 mg daily. Consider increasing to high potency if continued compliance. No ASA as he is on coumadin for noncompaction.  4. DMII  - On last BMET on 2/21, BG elevated at 350. Last A1c 11.9 on 08/22/15. Not measuring BG regularly at home. Has appointment with PCP in [place] on Friday. With weight loss, polyuria, polydipsia and polyphagia, worried about worsening glycemic control and encouraged him to keep his appointment on Friday.  5. Polysubstance abuse  - Clean since 09/16/16 6. Tobacco Abuse - Still smoking 10-12 cigarrettes a day, encouraged to quit. Offered smoking cessation aids and he stated he wanted to quit "cold [place]" like he did 6 years ago.  7. Non-compliance - Major barrier to patient care. - Seems to be doing better although does admit to missing 3-4 of his 2nd BID doses of medications. Stressed importance of adherence to medications and returning for follow up visits.   1) Medication changes: Resume losartan 25 mg PO daily  2) Labs: BMET next clinic visit on 12/18/15 3) Follow-up: PA clinic on 12/18/15   Erika K. Velva Harman, PharmD, BCPS, CPP Clinical Pharmacist Pager: [phone] Phone: [phone] 12/12/2015 12:58 PM

## 2015-12-17 ENCOUNTER — Encounter (HOSPITAL_COMMUNITY): Payer: Self-pay | Admitting: Internal Medicine

## 2015-12-17 ENCOUNTER — Telehealth (HOSPITAL_COMMUNITY): Payer: Self-pay | Admitting: Pharmacist

## 2015-12-17 ENCOUNTER — Other Ambulatory Visit (HOSPITAL_COMMUNITY): Payer: Self-pay | Admitting: Pharmacist

## 2015-12-17 MED ORDER — IVABRADINE HCL 5 MG PO TABS: 5.0000 mg | ORAL_TABLET | Freq: Two times a day (BID) | ORAL | Status: DC

## 2015-12-17 NOTE — Telephone Encounter (Signed)
Corlanor approved by [employer] PartD through 10/12/2016. Copay for this month is $265 since patient has deductible to meet. I have enrolled him in PAN foundation for assistance with copays. He will have $1500 through 12/15/2016. Outpatient pharmacy notified.   Billing ID: [id number] Person Code: [place] [facility]: [id number] RX BIN: [id number] PCN for Part D: [id number]   Ruta Hinds. Velva Harman, PharmD, BCPS, CPP Clinical Pharmacist Pager: [phone] Phone: [phone] 12/17/2015 3:06 PM

## 2015-12-18 ENCOUNTER — Ambulatory Visit (HOSPITAL_COMMUNITY)
Admission: RE | Admit: 2015-12-18 | Discharge: 2015-12-18 | Disposition: A | Discharge status: Home / Self Care | Payer: Medicare Other | Source: Ambulatory Visit | Attending: Cardiology | Admitting: Cardiology

## 2015-12-18 VITALS — BP 88/66 | HR 85 | Wt 151.0 lb

## 2015-12-18 DIAGNOSIS — Z95810 Presence of automatic (implantable) cardiac defibrillator: Secondary | ICD-10-CM | POA: Insufficient documentation

## 2015-12-18 DIAGNOSIS — Z79899 Other long term (current) drug therapy: Secondary | ICD-10-CM | POA: Insufficient documentation

## 2015-12-18 DIAGNOSIS — I2510 Atherosclerotic heart disease of native coronary artery without angina pectoris: Secondary | ICD-10-CM | POA: Insufficient documentation

## 2015-12-18 DIAGNOSIS — I5023 Acute on chronic systolic (congestive) heart failure: Secondary | ICD-10-CM | POA: Diagnosis not present

## 2015-12-18 DIAGNOSIS — I10 Essential (primary) hypertension: Secondary | ICD-10-CM | POA: Diagnosis not present

## 2015-12-18 DIAGNOSIS — Z8042 Family history of malignant neoplasm of prostate: Secondary | ICD-10-CM | POA: Diagnosis not present

## 2015-12-18 DIAGNOSIS — Z9119 Patient's noncompliance with other medical treatment and regimen: Secondary | ICD-10-CM | POA: Insufficient documentation

## 2015-12-18 DIAGNOSIS — Z8673 Personal history of transient ischemic attack (TIA), and cerebral infarction without residual deficits: Secondary | ICD-10-CM | POA: Insufficient documentation

## 2015-12-18 DIAGNOSIS — Z794 Long term (current) use of insulin: Secondary | ICD-10-CM | POA: Diagnosis not present

## 2015-12-18 DIAGNOSIS — Z720 Tobacco use: Secondary | ICD-10-CM | POA: Diagnosis not present

## 2015-12-18 DIAGNOSIS — F17210 Nicotine dependence, cigarettes, uncomplicated: Secondary | ICD-10-CM | POA: Insufficient documentation

## 2015-12-18 DIAGNOSIS — I5022 Chronic systolic (congestive) heart failure: Secondary | ICD-10-CM | POA: Diagnosis present

## 2015-12-18 DIAGNOSIS — E7800 Pure hypercholesterolemia, unspecified: Secondary | ICD-10-CM | POA: Insufficient documentation

## 2015-12-18 DIAGNOSIS — Z7901 Long term (current) use of anticoagulants: Secondary | ICD-10-CM | POA: Diagnosis not present

## 2015-12-18 DIAGNOSIS — Z833 Family history of diabetes mellitus: Secondary | ICD-10-CM | POA: Diagnosis not present

## 2015-12-18 DIAGNOSIS — I428 Other cardiomyopathies: Secondary | ICD-10-CM | POA: Diagnosis not present

## 2015-12-18 DIAGNOSIS — E785 Hyperlipidemia, unspecified: Secondary | ICD-10-CM | POA: Diagnosis not present

## 2015-12-18 DIAGNOSIS — Z8249 Family history of ischemic heart disease and other diseases of the circulatory system: Secondary | ICD-10-CM | POA: Diagnosis not present

## 2015-12-18 DIAGNOSIS — Z91199 Patient's noncompliance with other medical treatment and regimen due to unspecified reason: Secondary | ICD-10-CM

## 2015-12-18 DIAGNOSIS — E119 Type 2 diabetes mellitus without complications: Secondary | ICD-10-CM | POA: Insufficient documentation

## 2015-12-18 LAB — BASIC METABOLIC PANEL
Anion gap: 9 (ref 5–15)
BUN: 11 mg/dL (ref 6–20)
CO2: 31 mmol/L (ref 22–32)
Calcium: 9.4 mg/dL (ref 8.9–10.3)
Chloride: 101 mmol/L (ref 101–111)
Creatinine, Ser: 0.97 mg/dL (ref 0.61–1.24)
GFR calc Af Amer: >60 mL/min (ref >60)
GFR calc non Af Amer: >60 mL/min (ref >60)
Glucose, Bld: 141 mg/dL — ABNORMAL HIGH (ref 65–99)
Potassium: 4.2 mmol/L (ref 3.5–5.1)
Sodium: 141 mmol/L (ref 135–145)

## 2015-12-18 LAB — BRAIN NATRIURETIC PEPTIDE: B Natriuretic Peptide: 144.7 pg/mL — ABNORMAL HIGH (ref 0.0–100.0)

## 2015-12-18 MED ORDER — ATORVASTATIN CALCIUM 20 MG PO TABS: 20.0000 mg | ORAL_TABLET | Freq: Every day | ORAL | Status: DC

## 2015-12-18 MED ORDER — FUROSEMIDE 40 MG PO TABS: 40.0000 mg | ORAL_TABLET | Freq: Every day | ORAL | Status: DC

## 2015-12-18 MED ORDER — DIGOXIN 250 MCG PO TABS: 0.2500 mg | ORAL_TABLET | Freq: Every day | ORAL | Status: DC

## 2015-12-18 MED ORDER — CARVEDILOL 3.125 MG PO TABS: 3.1250 mg | ORAL_TABLET | Freq: Two times a day (BID) | ORAL | Status: DC

## 2015-12-18 NOTE — Patient Instructions (Addendum)
Do NOT PICK UP Losartan.  Follow up in 1 month.

## 2015-12-18 NOTE — Progress Notes (Signed)
Patient ID: Terry Bolotin, male   DOB: 1961/06/07, 55 y.o. Patient ID: Zyeir Dymek, male   DOB: 03/15/1961, 55 y.o.   MRN: [id number]    Advanced Heart Failure Clinic Note    HPI: Osmani is a 55 y/o male with h/o DM2, polysubstance use (tobacco, cocaine), mixed ICM/NICM and chronic systolic HF due to ventricular noncompaction- on chronic coumadin, noncompliance   Admitted to [facility] in 4/16 with acute CVA and with L sided weakness. CT showed R MCA stroke. Did not receive TPA due to delay in arrival. Had ECHO that showed EF 10-15% mild RV HK. No obvious LV clot. As he improved he was transferred to CIR but then developed hypotension and transferred back to HF ward. Underwent RHC on 4/27. Filling pressures mildly elevated CO ok. Diuresed gently. Meds limited by SBP in 80s. Could only tolerate ivabradine 7.5 bid, digoxin and spiro 12.5. Lasix and carvedilol stopped.    [place] 02/07/15 RA = 3 RV = 50/4/7 PA = 55/14 (34) PCW = 22 Fick cardiac output/index = 4.6/2.4 Thermodilution CO/CI = 4.8/2.5 PVR = 2.5 WU SVR = 1443 FA sat = 96% PA sat = 51%, 56%, 61% Noninvasive Ao pressure = 106/74 (86)   Seen in ED 11/30/15 for SOB and had several medications filled.   He presents today for HF follow up.  Started back on Coreg at last office visit, Started back on losartan last week at Pharmacy visit. Coumadin clinic adjusting dosing.  He did NOT start his losartan. No lightheadedness or dizziness. Breathing is good. He is down 7 lbs from last office visit ( up slightly from pharm visit). Walking on track M/W/F, now making the whole 16 laps (was stopping around 11-12 laps. No orthopnea or PND. Has been sleeping a lot better.  Still smoking 10-12 cigarettes a day.  No further drug use, states he has been clean since December.  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  Had some tightness in calfs with being on his feet working for multiple hours.      Past Medical History   Diagnosis Date  . Chronic systolic heart failure (HCC)     a) Mixed ICM/NICM b) RHC (05/2014): RA 2, RV 19/2/3, PA 22/14 (18), PCWP 6, Fick CO/CI: 5.2 / 2.7, PVR 2.3 WU, PA 60% and 64% c) ECHO (05/2014): EF 20-25%, diff HK, akinesis entireanteroseptal myocardium, triv AI, mod MR, LA mod/sev dilated  . Automatic implantable cardioverter-defibrillator in situ   . Heart murmur   . Sleep apnea     "cleared after T&A"  . 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[place])   . Ischemic cardiomyopathy     a) Coronary angiography (12/2008) at [facility]: Lmain: nl, LAD mid 100% stenosis with left to left and right-to-left collaterals to the distal LAD; Lcx: nl, RCA nl.    . Type II diabetes mellitus ([place])   . High cholesterol   . Pneumonia 1988  . Drug abuse and dependence ([place])   . CHF (congestive heart failure) ([place])      Current Outpatient Prescriptions on File Prior to Encounter  Medication Sig Dispense Refill  . atorvastatin (LIPITOR) 20 MG tablet Take 1 tablet (20 mg total) by mouth daily at 6 PM. 30 tablet 0  . carvedilol (COREG) 3.125 MG tablet Take 1 tablet (3.125 mg total) by mouth 2 (two) times daily with a meal. 60 tablet 0  . digoxin (LANOXIN) 0.25 MG tablet Take 1 tablet (0.25 mg total) by  mouth daily. 30 tablet 1  . furosemide (LASIX) 40 MG tablet Take 1 tablet (40 mg total) by mouth daily. 30 tablet 0  . glucose blood (ONETOUCH VERIO) test strip Use as instructed 100 each 12  . Insulin Glargine (LANTUS SOLOSTAR) 100 UNIT/ML Solostar Pen Inject 32 Units into the skin daily at 10 pm.    . ivabradine (CORLANOR) 5 MG TABS tablet Take 1 tablet (5 mg total) by mouth 2 (two) times daily with a meal. 60 tablet 5  . warfarin (COUMADIN) 7.5 MG tablet Take as directed by coumadin clinic 35 tablet 0  . losartan (COZAAR) 25 MG tablet Take 1 tablet (25 mg total) by mouth daily. (Patient not taking: Reported on 12/18/2015) 30 tablet 11  . QC PEN NEEDLES 31G X 6 MM MISC 1 each by Other route See admin instructions.  Reported on 12/18/2015     No current facility-administered medications on file prior to encounter.      No Known Allergies   Social History   Social History  . Marital Status: Single    Spouse Name: N/A  . Number of Children: 1  . Years of Education: 14   Occupational History  . Disabled    Social History Main Topics  . Smoking status: Current Some Day Smoker -- 0.25 packs/day for 31 years    Types: Cigarettes  . Smokeless tobacco: Never Used  . Alcohol Use: 0.0 oz/week    0 Standard drinks or equivalent per week     Comment: 09/21/2014 "might have a drink q 6 /months, if that"  . Drug Use: Yes    Special: Cocaine     Comment: 09/21/2014 "last cocaine ~ 1 1/2 months ago"  . Sexual Activity: Yes   Other Topics Concern  . Not on file   Social History Narrative   Lives in [place] with his mother. No pets. Fun: movies, sports, daughter.    Denies religious beliefs that would effect health care.               Family History  Problem Relation Age of Onset  . Diabetes Mother   . Heart disease Mother   . Diabetes Father   . Prostate cancer Father   . Heart disease Father   . Diabetes Brother   . Diabetes Brother      Filed Vitals:   12/18/15 1541  BP: 88/66  Pulse: 85  Weight: 151 lb (68.493 kg)  SpO2: 99%   Wt Readings from Last 3 Encounters:  12/18/15 151 lb (68.493 kg)  12/12/15 146 lb (66.225 kg)  12/04/15 158 lb (71.668 kg)       PHYSICAL EXAM: General:  Well appearing. No respiratory difficulty HEENT: normal Neck: supple. JVD 6-7. Carotids 2+ bilat; no bruits. No thyromegaly or nodule noted.  Cor: PMI laterally displaced. Regular. Mildly tachy +s3 Lungs: CTAB, normal effort.  Abdomen: soft, NT, ND, no HSM. No bruits or masses. +BS  Extremities: no cyanosis, clubbing, rash, edema Neuro: alert & oriented x 3, cranial nerves grossly intact. moves all 4 extremities w/o difficulty. Affect pleasant.   ASSESSMENT & PLAN: 1. Chronic Systolic  Heart Failure- ECHO EF 10-15% with non compaction s/p ICD - St Jude -- Volume status stable on exam. Was up on corevue, improving back on medications.  -- NYHA II symptoms. Congratulated on his compliance and  -- Continue ivabradine to 5 mg BID. Using PAN foundation to help pay for.  -- Continue coreg 3.125 mg BID.  --  Continue lasix 40 mg daily. BMET/BNP today.  -- Continue digoxin 0.25 mcg, check level today.  -- Will hold off on losartan and spiro with soft BP. Will give him time to stabilize on current meds before adding more.   -- Dr. Tanna Furry office following ICD. Will set up for follow up with him.  2. Hx of embolic CVA -- INR 3.1 11/16/38, dosing change by coumadin clinic who is following.  3. CAD Coronary angiography (12/2008) with mid LAD totally occluded and collateralized. - Continue atorvastatin 20 mg daily. No ASA he is on coumadin for noncompaction.  4. DMII  5. Polysubstance abuse  - Clean since 09/16/16 6. Tobacco Abuse - Smokes 10-12 cigarrettes a day, encouraged to quit. - Recommended he decide on a quit date and gradually cut back until them. He states he is eager to do so.  7. Non-compliance - Major barrier to patient care.  - Stressed importance of adherence to medications and returning for follow up visits.  - He has been doing very well so far.   1 month follow up on PA/NP side. BMET/BNP today. Will get him follow up across the street for device check.   Satira Mccallum Tillery PA-C 12/18/2015

## 2015-12-18 NOTE — Progress Notes (Signed)
Advanced Heart Failure Medication Review by a Pharmacist  Does the patient  feel that his/her medications are working for him/her?  yes  Has the patient been experiencing any side effects to the medications prescribed?  no  Does the patient measure his/her own blood pressure or blood glucose at home?  no   Does the patient have any problems obtaining medications due to transportation or finances?   no  Understanding of regimen: fair Understanding of indications: fair Potential of compliance: fair Patient understands to avoid NSAIDs. Patient understands to avoid decongestants.  Issues to address at subsequent visits: Adherence   Pharmacist comments: 60 YOM pleasant male presenting for HF evaluation with his prescription bottles. Pt reports that he needs refill for digoxin, atorvastatin, furosemide, and carvedilol.  Pt reports no SE to current regimen.  Pt has not picked up losartan that was prescribed last week from pharmacy but has been taking corlanor samples.  Pt reports going to his PCP last week and was started on Novolog Flexpen which he is going to pick up from [place].  Instructed pt that Corlanor PA was approved and he was set up with PAN foundation for copay assistance and he can pick it up from the [facility] outpatient pharmacy with the other refills we send in today.   Time with patient: 10 min Preparation and documentation time: 5 min Total time: 15 min

## 2015-12-26 ENCOUNTER — Other Ambulatory Visit (HOSPITAL_COMMUNITY): Payer: Self-pay | Admitting: Pharmacist

## 2015-12-26 ENCOUNTER — Ambulatory Visit (INDEPENDENT_AMBULATORY_CARE_PROVIDER_SITE_OTHER): Payer: Medicare Other | Admitting: *Deleted

## 2015-12-26 ENCOUNTER — Telehealth (HOSPITAL_COMMUNITY): Payer: Self-pay | Admitting: Pharmacist

## 2015-12-26 DIAGNOSIS — I5023 Acute on chronic systolic (congestive) heart failure: Secondary | ICD-10-CM

## 2015-12-26 DIAGNOSIS — Z7901 Long term (current) use of anticoagulants: Secondary | ICD-10-CM | POA: Diagnosis not present

## 2015-12-26 DIAGNOSIS — I428 Other cardiomyopathies: Secondary | ICD-10-CM | POA: Diagnosis not present

## 2015-12-26 LAB — POCT INR: INR: 1.5

## 2015-12-26 MED ORDER — ATORVASTATIN CALCIUM 20 MG PO TABS: 20.0000 mg | ORAL_TABLET | Freq: Every day | ORAL | Status: DC

## 2015-12-26 MED ORDER — DIGOXIN 250 MCG PO TABS: 0.2500 mg | ORAL_TABLET | Freq: Every day | ORAL | Status: DC

## 2015-12-26 MED ORDER — FUROSEMIDE 40 MG PO TABS
40.0000 mg | ORAL_TABLET | Freq: Every day | ORAL | Status: DC
Start: 2015-12-26 — End: 2016-03-05

## 2015-12-26 MED ORDER — CARVEDILOL 3.125 MG PO TABS: 3.1250 mg | ORAL_TABLET | Freq: Two times a day (BID) | ORAL | Status: DC

## 2015-12-26 NOTE — Telephone Encounter (Signed)
Mr. Rudd called stating that his Rx's are too early to be refilled until tomorrow and he will not be able to come out to our Outpatient pharmacy to pick them up. I have called in Rx's to the [facility] which is closer to him.   Ruta Hinds. Velva Harman, PharmD, BCPS, CPP Clinical Pharmacist Pager: [phone] Phone: [phone] 12/26/2015 2:30 PM

## 2016-01-11 ENCOUNTER — Telehealth: Payer: Self-pay | Admitting: *Deleted

## 2016-01-11 NOTE — Telephone Encounter (Signed)
Call from Dalbert Batman NP at [facility] and she states that pt is to remain on his coumadin for his colonoscopy on April 7th. He is not to hold coumadin  Spoke with pt as well as he did not keep his appt on March 28th and made an appt for him to be seen on April 3rd and he is also instructed that he is not to hold coumadin for his colonoscopy and he states understanding.

## 2016-01-15 ENCOUNTER — Ambulatory Visit (INDEPENDENT_AMBULATORY_CARE_PROVIDER_SITE_OTHER): Payer: Medicare Other | Admitting: *Deleted

## 2016-01-15 DIAGNOSIS — I428 Other cardiomyopathies: Secondary | ICD-10-CM | POA: Diagnosis not present

## 2016-01-15 DIAGNOSIS — Z7901 Long term (current) use of anticoagulants: Secondary | ICD-10-CM

## 2016-01-15 LAB — POCT INR: INR: 1.7

## 2016-01-16 ENCOUNTER — Ambulatory Visit (INDEPENDENT_AMBULATORY_CARE_PROVIDER_SITE_OTHER): Payer: Medicare Other | Admitting: Internal Medicine

## 2016-01-16 ENCOUNTER — Encounter: Payer: Self-pay | Admitting: Internal Medicine

## 2016-01-16 ENCOUNTER — Other Ambulatory Visit: Payer: Self-pay | Admitting: *Deleted

## 2016-01-16 VITALS — BP 98/72 | HR 85 | Ht 72.0 in | Wt 162.2 lb

## 2016-01-16 DIAGNOSIS — I5022 Chronic systolic (congestive) heart failure: Secondary | ICD-10-CM

## 2016-01-16 MED ORDER — WARFARIN SODIUM 7.5 MG PO TABS: ORAL_TABLET | ORAL | Status: DC

## 2016-01-16 NOTE — Progress Notes (Signed)
HPI Dennis [name] is referred today by Dr. Reine Just for ongoing evaluation and management of his ICD. He is a 55 yo man with a h/o mixed ischemic and non-ischemic CM, s/p ICD implant at [facility]. In the interim he has been stable from an EP perspective. His ICD has not fired and he has had no sustained arrhythmias. No syncope and he denies peripheral edema. He has class 2 symptoms. No Known Allergies   Current Outpatient Prescriptions  Medication Sig Dispense Refill  . atorvastatin (LIPITOR) 20 MG tablet Take 20 mg by mouth daily.  0  . carvedilol (COREG) 3.125 MG tablet Take 1 tablet (3.125 mg total) by mouth 2 (two) times daily with a meal. 60 tablet 1  . digoxin (LANOXIN) 0.25 MG tablet Take 1 tablet (0.25 mg total) by mouth daily. 30 tablet 1  . furosemide (LASIX) 40 MG tablet Take 1 tablet (40 mg total) by mouth daily. 30 tablet 1  . glucose blood (ONETOUCH VERIO) test strip Use as instructed 100 each 12  . insulin aspart (NOVOLOG FLEXPEN) 100 UNIT/ML FlexPen Inject 4 Units into the skin 3 (three) times daily with meals. Hold if you skip a meal    . Insulin Glargine (LANTUS SOLOSTAR) 100 UNIT/ML Solostar Pen Inject 32 Units into the skin daily at 10 pm.    . ivabradine (CORLANOR) 5 MG TABS tablet Take 1 tablet (5 mg total) by mouth 2 (two) times daily with a meal. 60 tablet 5  . losartan (COZAAR) 25 MG tablet Take 25 mg by mouth daily.  11  . QC PEN NEEDLES 31G X 6 MM MISC 1 each by Other route See admin instructions. Reported on 12/18/2015    . warfarin (COUMADIN) 7.5 MG tablet Take as directed by coumadin clinic 35 tablet 1   No current facility-administered medications for this visit.     Past Medical History  Diagnosis Date  . Chronic systolic heart failure (HCC)     a) Mixed ICM/NICM b) RHC (05/2014): RA 2, RV 19/2/3, PA 22/14 (18), PCWP 6, Fick CO/CI: 5.2 / 2.7, PVR 2.3 WU, PA 60% and 64% c) ECHO (05/2014): EF 20-25%, diff HK, akinesis entireanteroseptal myocardium, triv AI, mod MR, LA  mod/sev dilated  . Automatic implantable cardioverter-defibrillator in situ   . Heart murmur   . Sleep apnea     "cleared after T&A"  . 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[place])   . Ischemic cardiomyopathy     a) Coronary angiography (12/2008) at [facility]: Lmain: nl, LAD mid 100% stenosis with left to left and right-to-left collaterals to the distal LAD; Lcx: nl, RCA nl.    . Type II diabetes mellitus ([place])   . High cholesterol   . Pneumonia 1988  . Drug abuse and dependence ([place])   . CHF (congestive heart failure) (HCC)     ROS:   All systems reviewed and negative except as noted in the HPI.   Past Surgical History  Procedure Laterality Date  . Forearm fracture surgery Left 1980  . Cardiac defibrillator placement  03/2011  . Cholecystectomy  1994  . Right heart catheterization N/A 05/30/2014    Procedure: RIGHT HEART CATH;  Surgeon: Jolaine Artist, MD;  Location: [facility] CATH LAB;  Service: Cardiovascular;  Laterality: N/A;  . Fracture surgery    . Cardiac catheterization  05/2014  . Tonsillectomy and adenoidectomy  ~ 1998  . Right heart catheterization N/A 02/07/2015    Procedure: RIGHT HEART CATH;  Surgeon: Quillian Quince  R Bensimhon, MD;  Location: [place] CATH LAB;  Service: Cardiovascular;  Laterality: N/A;     Family History  Problem Relation Age of Onset  . Diabetes Mother   . Heart disease Mother   . Diabetes Father   . Prostate cancer Father   . Heart disease Father   . Diabetes Brother   . Diabetes Brother      Social History   Social History  . Marital Status: Single    Spouse Name: N/A  . Number of Children: 1  . Years of Education: 14   Occupational History  . Disabled    Social History Main Topics  . Smoking status: Current Some Day Smoker -- 0.25 packs/day for 31 years    Types: Cigarettes  . Smokeless tobacco: Never Used  . Alcohol Use: 0.0 oz/week    0 Standard drinks or equivalent per week     Comment: 09/21/2014 "might have a drink q 6 /months, if that"  .  Drug Use: Yes    Special: Cocaine     Comment: 09/21/2014 "last cocaine ~ 1 1/2 months ago"  . Sexual Activity: Yes   Other Topics Concern  . Not on file   Social History Narrative   Lives in [place] with his mother. No pets. Fun: movies, sports, daughter.    Denies religious beliefs that would effect health care.      BP 98/72 mmHg  Pulse 85  Ht 6' (1.829 m)  Wt 162 lb 3.2 oz (73.573 kg)  BMI 21.99 kg/m2  SpO2 99%  Physical Exam:  Well appearing middle aged man, NAD HEENT: Unremarkable Neck:  6 cm JVD, no thyromegally Lymphatics:  No adenopathy Back:  No CVA tenderness Lungs:  Clear with no wheezes, well healed ICD incision. HEART:  Regular rate rhythm, no murmurs, no rubs, no clicks Abd:  soft, positive bowel sounds, no organomegally, no rebound, no guarding Ext:  2 plus pulses, no edema, no cyanosis, no clubbing Skin:  No rashes no nodules Neuro:  CN II through XII intact, motor grossly intact   DEVICE  Normal device function.  See PaceArt for details.   Assess/Plan: 1. Chronic systolic heart failure - his symptoms are class 2. He will maintain a low sodium diet. He will continue his current meds. 2. ICD - his DDD St. Jude ICD is working normally. No ICD therapies 3. CAD - he is encouraged to stop smoking. He is asymptomatic. 4. Tobacco abuse - he is encouraged to stop smoking.  Dennis [name].D.

## 2016-01-16 NOTE — Patient Instructions (Signed)
Medication Instructions:  Your physician recommends that you continue on your current medications as directed. Please refer to the Current Medication list given to you today.   Labwork: None ordered   Testing/Procedures: None ordered   Follow-Up: Your physician wants you to follow-up in: 12 months with Dr Knox Saliva will receive a reminder letter in the mail two months in advance. If you don't receive a letter, please call our office to schedule the follow-up appointment.   Remote monitoring is used to monitor your  ICD from home. This monitoring reduces the number of office visits required to check your device to one time per year. It allows [place] to keep an eye on the functioning of your device to ensure it is working properly. You are scheduled for a device check from home on 04/16/16. You may send your transmission at any time that day. If you have a wireless device, the transmission will be sent automatically. After your physician reviews your transmission, you will receive a postcard with your next transmission date.     Any Other Special Instructions Will Be Listed Below (If Applicable).     If you need a refill on your cardiac medications before your next appointment, please call your pharmacy.

## 2016-01-23 ENCOUNTER — Ambulatory Visit (HOSPITAL_COMMUNITY)
Admission: RE | Admit: 2016-01-23 | Discharge: 2016-01-23 | Disposition: A | Discharge status: Home / Self Care | Payer: Medicare Other | Source: Ambulatory Visit | Attending: Internal Medicine | Admitting: Internal Medicine

## 2016-01-23 VITALS — BP 84/62 | HR 87 | Wt 163.8 lb

## 2016-01-23 DIAGNOSIS — Z720 Tobacco use: Secondary | ICD-10-CM

## 2016-01-23 DIAGNOSIS — F1410 Cocaine abuse, uncomplicated: Secondary | ICD-10-CM

## 2016-01-23 DIAGNOSIS — Z8042 Family history of malignant neoplasm of prostate: Secondary | ICD-10-CM | POA: Insufficient documentation

## 2016-01-23 DIAGNOSIS — E7800 Pure hypercholesterolemia, unspecified: Secondary | ICD-10-CM | POA: Insufficient documentation

## 2016-01-23 DIAGNOSIS — Z8249 Family history of ischemic heart disease and other diseases of the circulatory system: Secondary | ICD-10-CM | POA: Diagnosis not present

## 2016-01-23 DIAGNOSIS — F17200 Nicotine dependence, unspecified, uncomplicated: Secondary | ICD-10-CM | POA: Diagnosis not present

## 2016-01-23 DIAGNOSIS — Z8673 Personal history of transient ischemic attack (TIA), and cerebral infarction without residual deficits: Secondary | ICD-10-CM | POA: Insufficient documentation

## 2016-01-23 DIAGNOSIS — E119 Type 2 diabetes mellitus without complications: Secondary | ICD-10-CM | POA: Diagnosis not present

## 2016-01-23 DIAGNOSIS — Z9119 Patient's noncompliance with other medical treatment and regimen: Secondary | ICD-10-CM | POA: Insufficient documentation

## 2016-01-23 DIAGNOSIS — Z95810 Presence of automatic (implantable) cardiac defibrillator: Secondary | ICD-10-CM | POA: Diagnosis not present

## 2016-01-23 DIAGNOSIS — Z833 Family history of diabetes mellitus: Secondary | ICD-10-CM | POA: Diagnosis not present

## 2016-01-23 DIAGNOSIS — Z7901 Long term (current) use of anticoagulants: Secondary | ICD-10-CM | POA: Diagnosis not present

## 2016-01-23 DIAGNOSIS — Z794 Long term (current) use of insulin: Secondary | ICD-10-CM | POA: Insufficient documentation

## 2016-01-23 DIAGNOSIS — I2510 Atherosclerotic heart disease of native coronary artery without angina pectoris: Secondary | ICD-10-CM | POA: Diagnosis not present

## 2016-01-23 DIAGNOSIS — I5022 Chronic systolic (congestive) heart failure: Secondary | ICD-10-CM | POA: Diagnosis not present

## 2016-01-23 DIAGNOSIS — F17210 Nicotine dependence, cigarettes, uncomplicated: Secondary | ICD-10-CM | POA: Diagnosis not present

## 2016-01-23 DIAGNOSIS — Z79899 Other long term (current) drug therapy: Secondary | ICD-10-CM | POA: Insufficient documentation

## 2016-01-23 DIAGNOSIS — I428 Other cardiomyopathies: Secondary | ICD-10-CM | POA: Diagnosis not present

## 2016-01-23 LAB — CUP PACEART INCLINIC DEVICE CHECK
Battery Remaining Longevity: 49.2
Brady Statistic RA Percent Paced: 0 %
Brady Statistic RV Percent Paced: 0 %
Date Time Interrogation Session: 20170405205516
HighPow Impedance: 57.375
Implantable Lead Implant Date: 20110513
Implantable Lead Implant Date: 20110513
Implantable Lead Location: 753859
Implantable Lead Location: 753860
Lead Channel Impedance Value: 337.5 Ohm
Lead Channel Impedance Value: 375 Ohm
Lead Channel Pacing Threshold Amplitude: 0.5 V
Lead Channel Pacing Threshold Amplitude: 0.75 V
Lead Channel Pacing Threshold Pulse Width: 0.5 ms
Lead Channel Pacing Threshold Pulse Width: 0.5 ms
Lead Channel Sensing Intrinsic Amplitude: 12 mV
Lead Channel Sensing Intrinsic Amplitude: 5 mV
Lead Channel Setting Pacing Amplitude: 2 V
Lead Channel Setting Pacing Amplitude: 2.5 V
Lead Channel Setting Pacing Pulse Width: 0.5 ms
Lead Channel Setting Sensing Sensitivity: 0.5 mV
Pulse Gen Serial Number: 608100

## 2016-01-23 MED ORDER — IVABRADINE HCL 5 MG PO TABS: 7.5000 mg | ORAL_TABLET | Freq: Two times a day (BID) | ORAL | Status: DC

## 2016-01-23 NOTE — Patient Instructions (Addendum)
INCREASE Corlanor to 7.5 mg (1.5 tabs) twice daily.  Will schedule you for Cardiopulmonary Exercise Test. This test is done at our [facility]. Please wear comfortable clothes and shoes for this test. Avoid heavy meal before the test (light snack/meal recommended). Avoid caffeine, alcohol, tobacco products 12 hrs before test. Please give 24 hr notice for cancellations/rescheduling: [phone].  Follow up 4 weeks with Amy Clegg NP-C.  Do the following things EVERYDAY: 1) Weigh yourself in the morning before breakfast. Write it down and keep it in a log. 2) Take your medicines as prescribed 3) Eat low salt foods-Limit salt (sodium) to 2000 mg per day.  4) Stay as active as you can everyday 5) Limit all fluids for the day to less than 2 liters

## 2016-01-23 NOTE — Progress Notes (Signed)
Patient ID: Dennis [name], male   DOB: Mar 29, 1961, 54 y.o. Patient ID: Dennis [name], male   DOB: 01-25-1961, 55 y.o.   MRN: [id number]    Advanced Heart Failure Clinic Note   HF MD: Dr Haroldine Laws.  PCP: [facility] EP: Dr Lovena Le   HPI: Dennis [name] is a 55 y/o male with h/o DM2, polysubstance use (tobacco, cocaine), mixed ICM/NICM and chronic systolic HF due to ventricular noncompaction- on chronic coumadin, noncompliance   Admitted to [facility] in 4/16 with acute CVA and with L sided weakness. CT showed R MCA stroke. Did not receive TPA due to delay in arrival. Had ECHO that showed EF 10-15% mild RV HK. No obvious LV clot. As he improved he was transferred to CIR but then developed hypotension and transferred back to HF ward. Underwent RHC on 4/27. Filling pressures mildly elevated CO ok. Diuresed gently. Meds limited by SBP in 80s. Could only tolerate ivabradine 7.5 bid, digoxin and spiro 12.5. Lasix and carvedilol stopped.   Today he returns for HF follow up. Overall feeling ok. Denies SOB/PND/Orthopnea/CP. No bleeding problems. Mild dizziness in the morning. Does admit to mil dyspnea with steps. Appetite going up. Eating fast food at least twice a day 7 days a week[facility].  Weight at hone 161-163 pounds. Smoking 1PPD. Has not been using drugs in 5 months.    [place] 02/07/15 RA = 3 RV = 50/4/7 PA = 55/14 (34) PCW = 22 Fick cardiac output/index = 4.6/2.4 Thermodilution CO/CI = 4.8/2.5 PVR = 2.5 WU SVR = 1443 FA sat = 96% PA sat = 51%, 56%, 61% Noninvasive Ao pressure = 106/74 (86)   ECHO 01/2015: EF 10-15%.       Past Medical History  Diagnosis Date  . Chronic systolic heart failure (HCC)     a) Mixed ICM/NICM b) RHC (05/2014): RA 2, RV 19/2/3, PA 22/14 (18), PCWP 6, Fick CO/CI: 5.2 / 2.7, PVR 2.3 WU, PA 60% and 64% c) ECHO (05/2014): EF 20-25%, diff HK, akinesis entireanteroseptal myocardium, triv AI, mod MR, LA mod/sev dilated  . Automatic implantable  cardioverter-defibrillator in situ   . Heart murmur   . Sleep apnea     "cleared after T&A"  . 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[place])   . Ischemic cardiomyopathy     a) Coronary angiography (12/2008) at [facility]: Lmain: nl, LAD mid 100% stenosis with left to left and right-to-left collaterals to the distal LAD; Lcx: nl, RCA nl.    . Type II diabetes mellitus ([place])   . High cholesterol   . Pneumonia 1988  . Drug abuse and dependence ([place])   . CHF (congestive heart failure) ([place])      Current Outpatient Prescriptions on File Prior to Encounter  Medication Sig Dispense Refill  . atorvastatin (LIPITOR) 20 MG tablet Take 20 mg by mouth daily.  0  . carvedilol (COREG) 3.125 MG tablet Take 1 tablet (3.125 mg total) by mouth 2 (two) times daily with a meal. 60 tablet 1  . digoxin (LANOXIN) 0.25 MG tablet Take 1 tablet (0.25 mg total) by mouth daily. 30 tablet 1  . furosemide (LASIX) 40 MG tablet Take 1 tablet (40 mg total) by mouth daily. 30 tablet 1  . glucose blood (ONETOUCH VERIO) test strip Use as instructed 100 each 12  . insulin aspart (NOVOLOG FLEXPEN) 100 UNIT/ML FlexPen Inject 4 Units into the skin 3 (three) times daily with meals. Hold if you skip a meal    .  Insulin Glargine (LANTUS SOLOSTAR) 100 UNIT/ML Solostar Pen Inject 32 Units into the skin daily at 10 pm.    . ivabradine (CORLANOR) 5 MG TABS tablet Take 1 tablet (5 mg total) by mouth 2 (two) times daily with a meal. 60 tablet 5  . losartan (COZAAR) 25 MG tablet Take 25 mg by mouth daily.  11  . QC PEN NEEDLES 31G X 6 MM MISC 1 each by Other route See admin instructions. Reported on 12/18/2015    . warfarin (COUMADIN) 7.5 MG tablet Take as directed by coumadin clinic 35 tablet 1   No current facility-administered medications on file prior to encounter.      No Known Allergies   Social History   Social History  . Marital Status: Single    Spouse Name: N/A  . Number of Children: 1  . Years of Education: 14   Occupational  History  . Disabled    Social History Main Topics  . Smoking status: Current Some Day Smoker -- 0.25 packs/day for 31 years    Types: Cigarettes  . Smokeless tobacco: Never Used  . Alcohol Use: 0.0 oz/week    0 Standard drinks or equivalent per week     Comment: 09/21/2014 "might have a drink q 6 /months, if that"  . Drug Use: Yes    Special: Cocaine     Comment: 09/21/2014 "last cocaine ~ 1 1/2 months ago"  . Sexual Activity: Yes   Other Topics Concern  . Not on file   Social History Narrative   Lives in [place] with his mother. No pets. Fun: movies, sports, daughter.    Denies religious beliefs that would effect health care.               Family History  Problem Relation Age of Onset  . Diabetes Mother   . Heart disease Mother   . Diabetes Father   . Prostate cancer Father   . Heart disease Father   . Diabetes Brother   . Diabetes Brother      Filed Vitals:   01/23/16 1424  BP: 84/62  Pulse: 87  Weight: 163 lb 12.8 oz (74.299 kg)  SpO2: 97%   Wt Readings from Last 3 Encounters:  01/23/16 163 lb 12.8 oz (74.299 kg)  01/16/16 162 lb 3.2 oz (73.573 kg)  12/18/15 151 lb (68.493 kg)       PHYSICAL EXAM: General:  Well appearing. No respiratory difficulty HEENT: normal Neck: supple. JVD flat .Carotids 2+ bilat; no bruits. No thyromegaly or nodule noted.  Cor: PMI laterally displaced. Regular. No S3.  Lungs: CTAB, normal effort.  Abdomen: soft, NT, ND, no HSM. No bruits or masses. +BS  Extremities: no cyanosis, clubbing, rash, edema Neuro: alert & oriented x 3, cranial nerves grossly intact. moves all 4 extremities w/o difficulty. Affect pleasant.   ASSESSMENT & PLAN: 1. Chronic Systolic Heart Failure- 9811 ECHO EF 10-15% with non compaction s/p ICD - St Jude.- Followed by Dr Lovena Le.  --NYHA II symptoms. Volume status stable despite weight gain. Continue lasix 40 mg daily. Hold off on spiro with soft SBP.   -- Increase  ivabradine to 7.5 mg BID. Using  PAN foundation to help pay for.  -- Continue coreg 3.125 mg BID. No room to titrate due to hypotension.  -- Continue digoxin 0.25 mcg, dig level < 0.2  11/2015.  - Continue losartan 25 mg daily. Consider entresto at next visit. Would need to change lasix to as needed of  entresto started.  - Set up CPX to establish a baseline.  -Reinforced low salt diet and medication compliance.    2. Hx of embolic CVA -- INR followed at the coumadin clinic.   3. CAD Coronary angiography (12/2008) with mid LAD totally occluded and collateralized. - Continue atorvastatin 20 mg daily. No ASA he is on coumadin for noncompaction.  4. DMII- Followed by PCP.  5. Polysubstance abuse - has not used since 09/2015.  6. Tobacco Abuse-  Discussed smoking cessation. Encourage to cut back.  -7. Non-compliance- Doing much better.   Follow up in 4 weeks to discuss CPX results.  Amy Clegg NP-C  01/23/2016

## 2016-01-24 ENCOUNTER — Other Ambulatory Visit (HOSPITAL_COMMUNITY): Payer: Self-pay | Admitting: Pharmacist

## 2016-01-24 MED ORDER — IVABRADINE HCL 7.5 MG PO TABS: 7.5000 mg | ORAL_TABLET | Freq: Two times a day (BID) | ORAL | Status: DC

## 2016-01-29 ENCOUNTER — Other Ambulatory Visit (HOSPITAL_COMMUNITY): Payer: Self-pay | Admitting: Cardiology

## 2016-01-29 ENCOUNTER — Ambulatory Visit (INDEPENDENT_AMBULATORY_CARE_PROVIDER_SITE_OTHER): Payer: Medicare Other | Admitting: *Deleted

## 2016-01-29 DIAGNOSIS — I428 Other cardiomyopathies: Secondary | ICD-10-CM | POA: Diagnosis not present

## 2016-01-29 DIAGNOSIS — Z7901 Long term (current) use of anticoagulants: Secondary | ICD-10-CM

## 2016-01-29 LAB — POCT INR: INR: 3.8

## 2016-01-29 MED ORDER — IVABRADINE HCL 7.5 MG PO TABS: 7.5000 mg | ORAL_TABLET | Freq: Two times a day (BID) | ORAL | Status: DC

## 2016-01-31 ENCOUNTER — Telehealth (HOSPITAL_COMMUNITY): Payer: Self-pay | Admitting: Pharmacist

## 2016-01-31 NOTE — Telephone Encounter (Signed)
Corlanor 7.5 mg BID PA approved by [employer] through 10/12/16.   Ruta Hinds. Velva Harman, PharmD, BCPS, CPP Clinical Pharmacist Pager: [phone] Phone: [phone] 01/31/2016 4:20 PM

## 2016-02-01 ENCOUNTER — Other Ambulatory Visit (HOSPITAL_COMMUNITY): Payer: Self-pay | Admitting: *Deleted

## 2016-02-05 ENCOUNTER — Encounter: Payer: Self-pay | Admitting: Internal Medicine

## 2016-02-06 ENCOUNTER — Telehealth (HOSPITAL_COMMUNITY): Payer: Self-pay | Admitting: *Deleted

## 2016-02-06 NOTE — Telephone Encounter (Signed)
Whitney left a VM needed pt's dx and last EF, he has been enrolled into [facility] HF program and she needs info to complete the enrollment.  Attempted to call back and got her confidential VM, left mess w/info

## 2016-02-07 ENCOUNTER — Ambulatory Visit (HOSPITAL_COMMUNITY): Payer: Medicare Other | Attending: Internal Medicine

## 2016-02-07 ENCOUNTER — Other Ambulatory Visit (HOSPITAL_COMMUNITY): Payer: Self-pay | Admitting: *Deleted

## 2016-02-07 ENCOUNTER — Telehealth (HOSPITAL_COMMUNITY): Payer: Self-pay | Admitting: Pharmacist

## 2016-02-07 DIAGNOSIS — I5022 Chronic systolic (congestive) heart failure: Secondary | ICD-10-CM | POA: Diagnosis not present

## 2016-02-07 NOTE — Telephone Encounter (Signed)
Dennis [name] stated that the [facility] pahrmacy was having difficulty processing his Corlanor 7.5 mg BID. PA was already approved for this dosage so called pharmacy with this information and they verified that it did go through at no cost to Dennis [name]. He has been made aware and will pick up tomorrow.   Ruta Hinds. Velva Harman, PharmD, BCPS, CPP Clinical Pharmacist Pager: [phone] Phone: [phone] 02/07/2016 3:05 PM

## 2016-02-12 ENCOUNTER — Ambulatory Visit (INDEPENDENT_AMBULATORY_CARE_PROVIDER_SITE_OTHER): Payer: Medicare Other | Admitting: *Deleted

## 2016-02-12 DIAGNOSIS — I428 Other cardiomyopathies: Secondary | ICD-10-CM

## 2016-02-12 DIAGNOSIS — Z7901 Long term (current) use of anticoagulants: Secondary | ICD-10-CM | POA: Diagnosis not present

## 2016-02-12 LAB — POCT INR: INR: 2.4

## 2016-02-20 ENCOUNTER — Inpatient Hospital Stay (HOSPITAL_COMMUNITY): Admission: RE | Admit: 2016-02-20 | Payer: Self-pay | Source: Ambulatory Visit

## 2016-02-21 ENCOUNTER — Ambulatory Visit (HOSPITAL_COMMUNITY)
Admission: RE | Admit: 2016-02-21 | Discharge: 2016-02-21 | Disposition: A | Discharge status: Home / Self Care | Payer: Medicare Other | Source: Ambulatory Visit | Attending: Internal Medicine | Admitting: Internal Medicine

## 2016-02-21 VITALS — BP 112/80 | HR 92 | Wt 161.6 lb

## 2016-02-21 DIAGNOSIS — Z95810 Presence of automatic (implantable) cardiac defibrillator: Secondary | ICD-10-CM | POA: Insufficient documentation

## 2016-02-21 DIAGNOSIS — F17200 Nicotine dependence, unspecified, uncomplicated: Secondary | ICD-10-CM

## 2016-02-21 DIAGNOSIS — Z8249 Family history of ischemic heart disease and other diseases of the circulatory system: Secondary | ICD-10-CM | POA: Insufficient documentation

## 2016-02-21 DIAGNOSIS — Z794 Long term (current) use of insulin: Secondary | ICD-10-CM | POA: Insufficient documentation

## 2016-02-21 DIAGNOSIS — Z7901 Long term (current) use of anticoagulants: Secondary | ICD-10-CM | POA: Diagnosis not present

## 2016-02-21 DIAGNOSIS — I2510 Atherosclerotic heart disease of native coronary artery without angina pectoris: Secondary | ICD-10-CM | POA: Diagnosis not present

## 2016-02-21 DIAGNOSIS — Z8673 Personal history of transient ischemic attack (TIA), and cerebral infarction without residual deficits: Secondary | ICD-10-CM | POA: Diagnosis not present

## 2016-02-21 DIAGNOSIS — Z833 Family history of diabetes mellitus: Secondary | ICD-10-CM | POA: Insufficient documentation

## 2016-02-21 DIAGNOSIS — F17210 Nicotine dependence, cigarettes, uncomplicated: Secondary | ICD-10-CM | POA: Insufficient documentation

## 2016-02-21 DIAGNOSIS — E119 Type 2 diabetes mellitus without complications: Secondary | ICD-10-CM | POA: Diagnosis not present

## 2016-02-21 DIAGNOSIS — I5022 Chronic systolic (congestive) heart failure: Secondary | ICD-10-CM | POA: Diagnosis not present

## 2016-02-21 DIAGNOSIS — I255 Ischemic cardiomyopathy: Secondary | ICD-10-CM | POA: Diagnosis not present

## 2016-02-21 DIAGNOSIS — F1410 Cocaine abuse, uncomplicated: Secondary | ICD-10-CM | POA: Diagnosis not present

## 2016-02-21 DIAGNOSIS — Z79899 Other long term (current) drug therapy: Secondary | ICD-10-CM | POA: Diagnosis not present

## 2016-02-21 DIAGNOSIS — I428 Other cardiomyopathies: Secondary | ICD-10-CM

## 2016-02-21 DIAGNOSIS — E7800 Pure hypercholesterolemia, unspecified: Secondary | ICD-10-CM | POA: Insufficient documentation

## 2016-02-21 NOTE — Patient Instructions (Signed)
Your physician recommends that you schedule a follow-up appointment in: 2 months In the [place] following things EVERYDAY: 1) Weigh yourself in the morning before breakfast. Write it down and keep it in a log. 2) Take your medicines as prescribed 3) Eat low salt foods-Limit salt (sodium) to 2000 mg per day.  4) Stay as active as you can everyday 5) Limit all fluids for the day to less than 2 liters 6)

## 2016-02-21 NOTE — Progress Notes (Signed)
Patient ID: Dennis [name], male   DOB: 11-26-1960, 55 y.o.   MRN: [id number]    Advanced Heart Failure Clinic Note   HF MD: Dr Haroldine Laws.  PCP: [facility] EP: Dr Lovena Le   HPI: Dennis [name] is a 55 y/o male with h/o DM2, polysubstance use (tobacco, cocaine), mixed ICM/NICM and chronic systolic HF due to ventricular noncompaction- on chronic coumadin, noncompliance   Admitted to [facility] in 4/16 with acute CVA and with L sided weakness. CT showed R MCA stroke. Did not receive TPA due to delay in arrival. Had ECHO that showed EF 10-15% mild RV HK. No obvious LV clot. As he improved he was transferred to CIR but then developed hypotension and transferred back to HF ward. Underwent RHC on 4/27. Filling pressures mildly elevated CO ok. Diuresed gently. Meds limited by SBP in 80s. Could only tolerate ivabradine 7.5 bid, digoxin and spiro 12.5. Lasix and carvedilol stopped.   Today he returns for HF follow up. Last visit corlanor was increased. Overall feeling ok. Denies SOB/PND/Orthopnea. SOB sex and steps. Says he has cut back on fast food and only eats out a couple times a week.  [facility]  Weight at hone 159-161pounds. Smoking 10-12 cigarettes per day. Has not been using drugs in 6 months.    [place] 02/07/15 RA = 3 RV = 50/4/7 PA = 55/14 (34) PCW = 22 Fick cardiac output/index = 4.6/2.4 Thermodilution CO/CI = 4.8/2.5 PVR = 2.5 WU SVR = 1443 FA sat = 96% PA sat = 51%, 56%, 61% Noninvasive Ao pressure = 106/74 (86)   ECHO 01/2015: EF 10-15%.   CPX 02/07/2016 Peak VO2: 18.7 (54% predicted peak VO2) VE/VCO2 slope: 30 OUES: 1.62 Peak RER:  1.13 Ventilatory Threshold: 10.9 (32% predicted or measured peak VO2)   Past Medical History  Diagnosis Date  . Chronic systolic heart failure (HCC)     a) Mixed ICM/NICM b) RHC (05/2014): RA 2, RV 19/2/3, PA 22/14 (18), PCWP 6, Fick CO/CI: 5.2 / 2.7, PVR 2.3 WU, PA 60% and 64% c) ECHO (05/2014): EF 20-25%, diff HK, akinesis  entireanteroseptal myocardium, triv AI, mod MR, LA mod/sev dilated  . Automatic implantable cardioverter-defibrillator in situ   . Heart murmur   . Sleep apnea     "cleared after T&A"  . 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Dennis [name])   . Ischemic cardiomyopathy     a) Coronary angiography (12/2008) at [facility]: Lmain: nl, LAD mid 100% stenosis with left to left and right-to-left collaterals to the distal LAD; Lcx: nl, RCA nl.    . Type II diabetes mellitus ([place])   . High cholesterol   . Pneumonia 1988  . Drug abuse and dependence ([place])   . CHF (congestive heart failure) ([place])      Current Outpatient Prescriptions on File Prior to Encounter  Medication Sig Dispense Refill  . atorvastatin (LIPITOR) 20 MG tablet Take 20 mg by mouth daily.  0  . carvedilol (COREG) 3.125 MG tablet Take 1 tablet (3.125 mg total) by mouth 2 (two) times daily with a meal. 60 tablet 1  . digoxin (LANOXIN) 0.25 MG tablet Take 1 tablet (0.25 mg total) by mouth daily. 30 tablet 1  . furosemide (LASIX) 40 MG tablet Take 1 tablet (40 mg total) by mouth daily. 30 tablet 1  . glucose blood (ONETOUCH VERIO) test strip Use as instructed 100 each 12  . insulin aspart (NOVOLOG FLEXPEN) 100 UNIT/ML FlexPen Inject 4 Units into the  skin 3 (three) times daily with meals. Hold if you skip a meal    . Insulin Glargine (LANTUS SOLOSTAR) 100 UNIT/ML Solostar Pen Inject 32 Units into the skin daily at 10 pm.    . ivabradine (CORLANOR) 7.5 MG TABS tablet Take 1 tablet (7.5 mg total) by mouth 2 (two) times daily with a meal. 60 tablet 5  . losartan (COZAAR) 25 MG tablet Take 25 mg by mouth daily.  11  . QC PEN NEEDLES 31G X 6 MM MISC 1 each by Other route See admin instructions. Reported on 12/18/2015    . warfarin (COUMADIN) 7.5 MG tablet Take as directed by coumadin clinic 35 tablet 1   No current facility-administered medications on file prior to encounter.      No Known Allergies   Social History   Social History  . Marital Status:  Single    Spouse Name: N/A  . Number of Children: 1  . Years of Education: 14   Occupational History  . Disabled    Social History Main Topics  . Smoking status: Current Some Day Smoker -- 0.25 packs/day for 31 years    Types: Cigarettes  . Smokeless tobacco: Never Used  . Alcohol Use: 0.0 oz/week    0 Standard drinks or equivalent per week     Comment: 09/21/2014 "might have a drink q 6 /months, if that"  . Drug Use: Yes    Special: Cocaine     Comment: 09/21/2014 "last cocaine ~ 1 1/2 months ago"  . Sexual Activity: Yes   Other Topics Concern  . Not on file   Social History Narrative   Lives in [place] with his mother. No pets. Fun: movies, sports, daughter.    Denies religious beliefs that would effect health care.               Family History  Problem Relation Age of Onset  . Diabetes Mother   . Heart disease Mother   . Diabetes Father   . Prostate cancer Father   . Heart disease Father   . Diabetes Brother   . Diabetes Brother      Danley Danker Vitals:   02/21/16 1508  BP: 112/80  Pulse: 92  Weight: 161 lb 9.6 oz (73.301 kg)  SpO2: 97%   Wt Readings from Last 3 Encounters:  02/21/16 161 lb 9.6 oz (73.301 kg)  01/23/16 163 lb 12.8 oz (74.299 kg)  01/16/16 162 lb 3.2 oz (73.573 kg)       PHYSICAL EXAM: General:  Well appearing. No respiratory difficulty HEENT: normal Neck: supple. JVD flat .Carotids 2+ bilat; no bruits. No thyromegaly or nodule noted.  Cor: PMI laterally displaced. Regular. No S3.  Lungs: CTAB, normal effort.  Abdomen: soft, NT, ND, no HSM. No bruits or masses. +BS  Extremities: no cyanosis, clubbing, rash, edema Neuro: alert & oriented x 3, cranial nerves grossly intact. moves all 4 extremities w/o difficulty. Affect pleasant.   ASSESSMENT & PLAN: 1. Chronic Systolic Heart Failure- 9518 ECHO EF 10-15% with non compaction s/p ICD - [employer].- Followed by Dr Lovena Le. Discussed CPX results. Moderate  HF limitation/restrictive lung  physiology. Can repeat in 6 months-12 months.  No need to pursue advanced therapies at this time.  --NYHA II symptoms. Seems to be doing well. Volume status stable.Continue lasix 40 mg daily. Hold off on spiro with soft SBP.   --Continue ivabradine to 7.5 mg BID. Using [employer] foundation to help pay for.  -- Continue coreg  3.125 mg twice a day. Has not had medications todyaNo room to titrate due to hypotension.  -- Continue digoxin 0.25 mcg, dig level < 0.2  11/2015.  - Continue losartan 25 mg daily. Consider entresto at next visit. Would need to change lasix to as needed if entresto started.  I am not up titrating meds to day as he has not had any medications today. When is has taken medications BP is marginal.    -Reinforced low salt diet and medication compliance.    2. Hx of embolic CVA -- INR followed at the coumadin clinic.   3. CAD Coronary angiography (12/2008) with mid LAD totally occluded and collateralized. - Continue atorvastatin 20 mg daily. No ASA he is on coumadin for noncompaction.  4. DMII- Followed by PCP.  5. Polysubstance abuse - has not used since 09/2015.  6. Tobacco Abuse-  Discussed smoking cessation. Encourage to cut back.  -7. Non-compliance- This does not seem to be an issue any more. Doing much better.   Follow up in 2 months.    Amy Clegg NP-C  02/21/2016

## 2016-02-22 ENCOUNTER — Other Ambulatory Visit (HOSPITAL_COMMUNITY): Payer: Self-pay | Admitting: Internal Medicine

## 2016-03-05 ENCOUNTER — Other Ambulatory Visit (HOSPITAL_COMMUNITY): Payer: Self-pay | Admitting: Internal Medicine

## 2016-03-14 ENCOUNTER — Other Ambulatory Visit (HOSPITAL_COMMUNITY): Payer: Self-pay | Admitting: Internal Medicine

## 2016-03-19 ENCOUNTER — Ambulatory Visit (INDEPENDENT_AMBULATORY_CARE_PROVIDER_SITE_OTHER): Payer: Medicare Other | Admitting: *Deleted

## 2016-03-19 DIAGNOSIS — Z7901 Long term (current) use of anticoagulants: Secondary | ICD-10-CM

## 2016-03-19 DIAGNOSIS — I428 Other cardiomyopathies: Secondary | ICD-10-CM | POA: Diagnosis not present

## 2016-03-19 LAB — POCT INR: INR: 2.8

## 2016-04-16 ENCOUNTER — Encounter: Payer: Medicare Other | Admitting: *Deleted

## 2016-04-16 ENCOUNTER — Telehealth: Payer: Self-pay | Admitting: Cardiology

## 2016-04-16 NOTE — Telephone Encounter (Signed)
Spoke with pt and reminded pt of remote transmission that is due today. Pt verbalized understanding.   

## 2016-04-18 ENCOUNTER — Encounter: Payer: Self-pay | Admitting: Cardiology

## 2016-04-23 ENCOUNTER — Ambulatory Visit (INDEPENDENT_AMBULATORY_CARE_PROVIDER_SITE_OTHER): Payer: Medicare Other | Admitting: *Deleted

## 2016-04-23 ENCOUNTER — Encounter (INDEPENDENT_AMBULATORY_CARE_PROVIDER_SITE_OTHER): Payer: Self-pay

## 2016-04-23 DIAGNOSIS — Z7901 Long term (current) use of anticoagulants: Secondary | ICD-10-CM | POA: Diagnosis not present

## 2016-04-23 DIAGNOSIS — I428 Other cardiomyopathies: Secondary | ICD-10-CM | POA: Diagnosis not present

## 2016-04-23 LAB — POCT INR: INR: 5.1

## 2016-05-02 ENCOUNTER — Ambulatory Visit (INDEPENDENT_AMBULATORY_CARE_PROVIDER_SITE_OTHER): Payer: Medicare Other | Admitting: *Deleted

## 2016-05-02 ENCOUNTER — Encounter (INDEPENDENT_AMBULATORY_CARE_PROVIDER_SITE_OTHER): Payer: Self-pay

## 2016-05-02 DIAGNOSIS — Z7901 Long term (current) use of anticoagulants: Secondary | ICD-10-CM | POA: Diagnosis not present

## 2016-05-02 DIAGNOSIS — I428 Other cardiomyopathies: Secondary | ICD-10-CM

## 2016-05-02 LAB — POCT INR: INR: 3.9

## 2016-05-06 ENCOUNTER — Ambulatory Visit (HOSPITAL_COMMUNITY)
Admission: RE | Admit: 2016-05-06 | Discharge: 2016-05-06 | Disposition: A | Discharge status: Home / Self Care | Payer: Medicare Other | Source: Ambulatory Visit | Attending: Cardiology | Admitting: Cardiology

## 2016-05-06 ENCOUNTER — Ambulatory Visit (INDEPENDENT_AMBULATORY_CARE_PROVIDER_SITE_OTHER): Payer: Medicare Other | Admitting: *Deleted

## 2016-05-06 VITALS — BP 102/70 | HR 84 | Wt 159.0 lb

## 2016-05-06 DIAGNOSIS — Z9119 Patient's noncompliance with other medical treatment and regimen: Secondary | ICD-10-CM | POA: Diagnosis not present

## 2016-05-06 DIAGNOSIS — Z8673 Personal history of transient ischemic attack (TIA), and cerebral infarction without residual deficits: Secondary | ICD-10-CM | POA: Diagnosis not present

## 2016-05-06 DIAGNOSIS — I255 Ischemic cardiomyopathy: Secondary | ICD-10-CM | POA: Diagnosis not present

## 2016-05-06 DIAGNOSIS — Z720 Tobacco use: Secondary | ICD-10-CM

## 2016-05-06 DIAGNOSIS — IMO0002 Reserved for concepts with insufficient information to code with codable children: Secondary | ICD-10-CM

## 2016-05-06 DIAGNOSIS — I428 Other cardiomyopathies: Secondary | ICD-10-CM

## 2016-05-06 DIAGNOSIS — Z7901 Long term (current) use of anticoagulants: Secondary | ICD-10-CM | POA: Diagnosis not present

## 2016-05-06 DIAGNOSIS — Z833 Family history of diabetes mellitus: Secondary | ICD-10-CM | POA: Diagnosis not present

## 2016-05-06 DIAGNOSIS — E1165 Type 2 diabetes mellitus with hyperglycemia: Secondary | ICD-10-CM

## 2016-05-06 DIAGNOSIS — Z95810 Presence of automatic (implantable) cardiac defibrillator: Secondary | ICD-10-CM | POA: Diagnosis not present

## 2016-05-06 DIAGNOSIS — I5022 Chronic systolic (congestive) heart failure: Secondary | ICD-10-CM | POA: Insufficient documentation

## 2016-05-06 DIAGNOSIS — Z794 Long term (current) use of insulin: Secondary | ICD-10-CM | POA: Diagnosis not present

## 2016-05-06 DIAGNOSIS — I509 Heart failure, unspecified: Secondary | ICD-10-CM | POA: Diagnosis not present

## 2016-05-06 DIAGNOSIS — Z79899 Other long term (current) drug therapy: Secondary | ICD-10-CM | POA: Insufficient documentation

## 2016-05-06 DIAGNOSIS — E118 Type 2 diabetes mellitus with unspecified complications: Secondary | ICD-10-CM | POA: Diagnosis not present

## 2016-05-06 DIAGNOSIS — I2510 Atherosclerotic heart disease of native coronary artery without angina pectoris: Secondary | ICD-10-CM | POA: Insufficient documentation

## 2016-05-06 DIAGNOSIS — Z8249 Family history of ischemic heart disease and other diseases of the circulatory system: Secondary | ICD-10-CM | POA: Diagnosis not present

## 2016-05-06 DIAGNOSIS — E7800 Pure hypercholesterolemia, unspecified: Secondary | ICD-10-CM | POA: Diagnosis not present

## 2016-05-06 DIAGNOSIS — Z8042 Family history of malignant neoplasm of prostate: Secondary | ICD-10-CM | POA: Diagnosis not present

## 2016-05-06 DIAGNOSIS — F17210 Nicotine dependence, cigarettes, uncomplicated: Secondary | ICD-10-CM | POA: Insufficient documentation

## 2016-05-06 DIAGNOSIS — Z91199 Patient's noncompliance with other medical treatment and regimen due to unspecified reason: Secondary | ICD-10-CM

## 2016-05-06 DIAGNOSIS — E119 Type 2 diabetes mellitus without complications: Secondary | ICD-10-CM | POA: Diagnosis not present

## 2016-05-06 LAB — BASIC METABOLIC PANEL
Anion gap: 8 (ref 5–15)
BUN: 15 mg/dL (ref 6–20)
CO2: 26 mmol/L (ref 22–32)
Calcium: 8.9 mg/dL (ref 8.9–10.3)
Chloride: 105 mmol/L (ref 101–111)
Creatinine, Ser: 1.19 mg/dL (ref 0.61–1.24)
GFR calc Af Amer: >60 mL/min (ref >60)
GFR calc non Af Amer: >60 mL/min (ref >60)
Glucose, Bld: 193 mg/dL — ABNORMAL HIGH (ref 65–99)
Potassium: 3.7 mmol/L (ref 3.5–5.1)
Sodium: 139 mmol/L (ref 135–145)

## 2016-05-06 LAB — BRAIN NATRIURETIC PEPTIDE: B Natriuretic Peptide: 106.3 pg/mL — ABNORMAL HIGH (ref 0.0–100.0)

## 2016-05-06 NOTE — Addendum Note (Signed)
Encounter addended by: Shirley Friar, PA-C on: 05/06/2016  4:10 PM<BR>    Actions taken: Sign clinical note

## 2016-05-06 NOTE — Patient Instructions (Signed)
Routine lab work today. Will notify you of abnormal results, otherwise no news is good news!  Follow up 2 months with echocardiogram and appointment with Dr. Aundra Dubin.  Do the following things EVERYDAY: 1) Weigh yourself in the morning before breakfast. Write it down and keep it in a log. 2) Take your medicines as prescribed 3) Eat low salt foods-Limit salt (sodium) to 2000 mg per day.  4) Stay as active as you can everyday 5) Limit all fluids for the day to less than 2 liters

## 2016-05-06 NOTE — Progress Notes (Addendum)
Patient ID: Talis Iwan, male   DOB: 1961/04/21, 55 y.o.   MRN: [id number]      Advanced Heart Failure Clinic Note   HF MD: Dr Haroldine Laws.  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 CT showed R MCA stroke. Did not receive TPA due to delay in arrival. Had ECHO that showed EF 10-15% mild RV HK. No obvious LV clot. As he improved he was transferred to CIR but then developed hypotension and transferred back to HF ward. Underwent RHC on 4/27. Filling pressures mildly elevated CO ok. Diuresed gently. Meds limited by SBP in 80s. Could only tolerate ivabradine 7.5 bid, digoxin and spiro 12.5. Lasix and carvedilol stopped.   Today he returns for HF follow up. Has been feeling good overall.  SOB here and there, still smoking ~ 10 cigarettes a day. Gets SOB walking up a flight of steps and intercourse. Mostly fine on flat ground.  Denies bendopnea or orthopnea. Still eating fast food at least once a day.  Doesn't add salt, but gets [employer]. Had biscuits and gravy this morning ( 2540 mg of sodium). Weight at home ~ 160. [facility]  Was 7 months clean as of 04/16/16.    Corevue: No VT/VF. Thoracic impedence above threshold. (dry)  RHC 02/07/15 RA = 3 RV = 50/4/7 PA = 55/14 (34) PCW = 22 Fick cardiac output/index = 4.6/2.4 Thermodilution CO/CI = 4.8/2.5 PVR = 2.5 WU SVR = 1443 FA sat = 96% PA sat = 51%, 56%, 61% Noninvasive Ao pressure = 106/74 (86)   ECHO 01/2015: EF 10-15%.   CPX 02/07/2016 Peak VO2: 18.7 (54% predicted peak VO2) VE/VCO2 slope: 30 OUES: 1.62 Peak RER:  1.13 Ventilatory Threshold: 10.9 (32% predicted or measured peak VO2)   Past Medical History:  Diagnosis Date  . Automatic implantable  cardioverter-defibrillator in situ   . CHF (congestive heart failure) ([place])   . Chronic systolic heart failure (HCC)    a) Mixed ICM/NICM b) RHC (05/2014): RA 2, RV 19/2/3, PA 22/14 (18), PCWP 6, Fick CO/CI: 5.2 / 2.7, PVR 2.3 WU, PA 60% and 64% c) ECHO (05/2014): EF 20-25%, diff HK, akinesis entireanteroseptal myocardium, triv AI, mod MR, LA mod/sev dilated  . Drug abuse and dependence ([place])   . Heart murmur   . High cholesterol   . Ischemic cardiomyopathy    a) Coronary angiography (12/2008) at [facility]: Lmain: nl, LAD mid 100% stenosis with left to left and right-to-left collaterals to the distal LAD; Lcx: nl, RCA nl.    . 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[place])   . Pneumonia 1988  . Sleep apnea    "cleared after T&A"  . Type II diabetes mellitus ([place])      Current Outpatient Prescriptions on File Prior to Encounter  Medication Sig Dispense Refill  . atorvastatin (LIPITOR) 20 MG tablet Take 20 mg by mouth daily.  0  . carvedilol (COREG) 3.125 MG tablet Take 1 tablet (3.125 mg total) by mouth 2 (two) times daily with a meal. 60 tablet 1  . DIGOX 250 MCG tablet TAKE 1 TABLET BY MOUTH DAILY. 30 tablet 1  . furosemide (LASIX) 40 MG tablet TAKE 1 TABLET BY MOUTH EVERY DAY 30 tablet 3  .  glucose blood (ONETOUCH VERIO) test strip Use as instructed 100 each 12  . Insulin Glargine (LANTUS SOLOSTAR) 100 UNIT/ML Solostar Pen Inject 10 Units into the skin daily at 10 pm.     . ivabradine (CORLANOR) 7.5 MG TABS tablet Take 1 tablet (7.5 mg total) by mouth 2 (two) times daily with a meal. 60 tablet 5  . losartan (COZAAR) 25 MG tablet Take 25 mg by mouth daily.  11  . QC PEN NEEDLES 31G X 6 MM MISC 1 each by Other route See admin instructions. Reported on 12/18/2015    . warfarin (COUMADIN) 7.5 MG tablet Take as directed by coumadin clinic 35 tablet 1   No current facility-administered medications on file prior to encounter.       No Known Allergies   Social History   Social History  . Marital  status: Single    Spouse name: N/A  . Number of children: 1  . Years of education: 15   Occupational History  . Disabled    Social History Main Topics  . Smoking status: Current Some Day Smoker    Packs/day: 0.25    Years: 31.00    Types: Cigarettes  . Smokeless tobacco: Never Used  . Alcohol use 0.0 oz/week     Comment: 09/21/2014 "might have a drink q 6 /months, if that"  . Drug use:     Types: Cocaine     Comment: 09/21/2014 "last cocaine ~ 1 1/2 months ago"  . Sexual activity: Yes   Other Topics Concern  . Not on file   Social History Narrative   Lives in [place] with his mother. No pets. Fun: movies, sports, daughter.    Denies religious beliefs that would effect health care.               Family History  Problem Relation Age of Onset  . Diabetes Mother   . Heart disease Mother   . Diabetes Father   . Prostate cancer Father   . Heart disease Father   . Diabetes Brother   . Diabetes Brother      Vitals:   05/06/16 1504  BP: 102/70  Pulse: 84  SpO2: 96%  Weight: 159 lb (72.1 kg)   Wt Readings from Last 3 Encounters:  05/06/16 159 lb (72.1 kg)  02/21/16 161 lb 9.6 oz (73.3 kg)  01/23/16 163 lb 12.8 oz (74.3 kg)       PHYSICAL EXAM: General:  Well appearing. NAD HEENT: normal Neck: supple. JVD not elevated.Carotids 2+ bilat; no bruits. No thyromegaly or lymphadenopathy noted.  Cor: PMI laterally displaced. RRR. No M/G/R appreciated Lungs: Clear, no resp distress Abdomen: soft, non-tender, non-distended, no HSM. No bruits or masses. +BS  Extremities: no cyanosis, clubbing, rash. No peripheral edema.  Neuro: alert & oriented x 3, cranial nerves grossly intact. moves all 4 extremities w/o difficulty. Affect pleasant.   ASSESSMENT & PLAN: 1. Chronic Systolic Heart Failure- 2297 ECHO EF 10-15% with non compaction s/p ICD - [employer] Jude.- Followed by Dr Lovena Le. Discussed CPX results. Moderate  HF limitation/restrictive lung physiology. Can repeat in 6  months-12 months.  No need to pursue advanced therapies at this time.  --NYHA II symptoms. Seems to be doing well. Volume status stable.Continue lasix 40 mg daily.  - Continue ivabradine to 7.5 mg BID. Uses PAN foundation.   - Continue coreg 3.125 mg twice a day. Will not uptitrate for now, with recent pressures as low as 89 SBP. (thinks within past  month at different office) - Continue digoxin 0.25 mcg, dig level < 0.2  11/2015.  - Continue losartan 25 mg daily. Will consider entresto in future, but BP too low today. (took his am medications.)  - Reviewed low salt diet including nutrition information at [employer] (which he frequents).  - Stressed importance of daily weights and limiting fluid to <2000 ml daily (64 oz). 2. Hx of embolic CVA - INR followed at the coumadin clinic.  Recently had high INR at 3.9 and meds adjusted, has recheck next week.  3. CAD Coronary angiography (12/2008) with mid LAD totally occluded and collateralized. - Continue atorvastatin 20 mg daily.  - No ASA as he has no symptoms and is on coumadin for LV  noncompaction.  4. DMII - Followed by PCP. Decreasing his diabetic medications at their direction with improved compliance. He is even being considered for stopping Insulin completely.  5. Polysubstance abuse - has not used since 09/16/2015.  6. Tobacco Abuse - We have again discussed smoking cessation. Plans on cutting back by at least half the next time he sees [place].  -7. Non-compliance-  - This continues to improve, as above.   BMET and CBC today.  Follow up 2 months with Dr. Haroldine Laws and Echo.   Satira Mccallum Tillery PA-C  05/06/16

## 2016-05-06 NOTE — Progress Notes (Signed)
Advanced Heart Failure Medication Review by a Pharmacist  Does the patient  feel that his/her medications are working for him/her?  yes  Has the patient been experiencing any side effects to the medications prescribed?  no  Does the patient measure his/her own blood pressure or blood glucose at home?  yes   Does the patient have any problems obtaining medications due to transportation or finances?   no  Understanding of regimen: good Understanding of indications: good Potential of compliance: good Patient understands to avoid NSAIDs. Patient understands to avoid decongestants.  Issues to address at subsequent visits: None   Pharmacist comments:  [name] is a pleasant 55 yo M presenting without a medication list but with great recall of his regimen including dosages. He reports great compliance with his medications and did not have any specific medication-related questions or concerns for me at this time.   Ruta Hinds. Velva Harman, PharmD, BCPS, CPP Clinical Pharmacist Pager: [phone] Phone: [phone] 05/06/2016 3:14 PM       Time with patient: 10 minutes Preparation and documentation time: 2 minutes Total time: 12 minutes

## 2016-05-07 ENCOUNTER — Encounter: Payer: Self-pay | Admitting: Cardiology

## 2016-05-07 NOTE — Progress Notes (Signed)
Remote ICD transmission.   

## 2016-05-09 ENCOUNTER — Other Ambulatory Visit (HOSPITAL_COMMUNITY): Payer: Self-pay | Admitting: Internal Medicine

## 2016-05-09 DIAGNOSIS — I5023 Acute on chronic systolic (congestive) heart failure: Secondary | ICD-10-CM

## 2016-05-12 LAB — CUP PACEART REMOTE DEVICE CHECK
Battery Remaining Longevity: 43 mo
Battery Remaining Percentage: 43 %
Brady Statistic RA Percent Paced: 0 %
Brady Statistic RV Percent Paced: 1 % — CL
Date Time Interrogation Session: 20170731131808
HighPow Impedance: 59 Ohm
Implantable Lead Implant Date: 20110513
Implantable Lead Implant Date: 20110513
Implantable Lead Location: 753859
Implantable Lead Location: 753860
Lead Channel Impedance Value: 300 Ohm
Lead Channel Impedance Value: 400 Ohm
Lead Channel Sensing Intrinsic Amplitude: 12 mV
Lead Channel Sensing Intrinsic Amplitude: 5 mV
Lead Channel Setting Pacing Amplitude: 2 V
Lead Channel Setting Pacing Amplitude: 2.5 V
Lead Channel Setting Pacing Pulse Width: 0.5 ms
Lead Channel Setting Sensing Sensitivity: 0.5 mV
Pulse Gen Serial Number: 608100

## 2016-05-19 ENCOUNTER — Ambulatory Visit (INDEPENDENT_AMBULATORY_CARE_PROVIDER_SITE_OTHER): Payer: Medicare Other | Admitting: Pharmacist

## 2016-05-19 DIAGNOSIS — I428 Other cardiomyopathies: Secondary | ICD-10-CM | POA: Diagnosis not present

## 2016-05-19 DIAGNOSIS — Z7901 Long term (current) use of anticoagulants: Secondary | ICD-10-CM | POA: Diagnosis not present

## 2016-05-19 LAB — POCT INR: INR: 4.6

## 2016-05-26 ENCOUNTER — Other Ambulatory Visit (HOSPITAL_COMMUNITY): Payer: Self-pay | Admitting: Internal Medicine

## 2016-06-04 ENCOUNTER — Ambulatory Visit (INDEPENDENT_AMBULATORY_CARE_PROVIDER_SITE_OTHER): Payer: Medicare Other | Admitting: *Deleted

## 2016-06-04 DIAGNOSIS — Z7901 Long term (current) use of anticoagulants: Secondary | ICD-10-CM

## 2016-06-04 DIAGNOSIS — I428 Other cardiomyopathies: Secondary | ICD-10-CM | POA: Diagnosis not present

## 2016-06-04 LAB — POCT INR: INR: 1.9

## 2016-06-18 ENCOUNTER — Other Ambulatory Visit (HOSPITAL_COMMUNITY): Payer: Self-pay | Admitting: Internal Medicine

## 2016-06-18 ENCOUNTER — Ambulatory Visit (INDEPENDENT_AMBULATORY_CARE_PROVIDER_SITE_OTHER): Payer: Medicare Other | Admitting: *Deleted

## 2016-06-18 DIAGNOSIS — I428 Other cardiomyopathies: Secondary | ICD-10-CM

## 2016-06-18 DIAGNOSIS — Z7901 Long term (current) use of anticoagulants: Secondary | ICD-10-CM

## 2016-06-18 DIAGNOSIS — I5023 Acute on chronic systolic (congestive) heart failure: Secondary | ICD-10-CM

## 2016-06-18 LAB — POCT INR: INR: 5.2

## 2016-06-27 ENCOUNTER — Encounter (INDEPENDENT_AMBULATORY_CARE_PROVIDER_SITE_OTHER): Payer: Self-pay

## 2016-06-27 ENCOUNTER — Ambulatory Visit (INDEPENDENT_AMBULATORY_CARE_PROVIDER_SITE_OTHER): Payer: Medicare Other | Admitting: Pharmacist

## 2016-06-27 DIAGNOSIS — Z7901 Long term (current) use of anticoagulants: Secondary | ICD-10-CM

## 2016-06-27 DIAGNOSIS — I428 Other cardiomyopathies: Secondary | ICD-10-CM | POA: Diagnosis not present

## 2016-06-27 LAB — POCT INR: INR: 2.3

## 2016-07-02 ENCOUNTER — Encounter: Payer: Self-pay | Admitting: Internal Medicine

## 2016-07-11 ENCOUNTER — Telehealth (HOSPITAL_COMMUNITY): Payer: Self-pay | Admitting: *Deleted

## 2016-07-11 ENCOUNTER — Ambulatory Visit (HOSPITAL_COMMUNITY)
Admission: RE | Admit: 2016-07-11 | Discharge: 2016-07-11 | Disposition: A | Discharge status: Home / Self Care | Payer: Medicare Other | Source: Ambulatory Visit | Attending: Cardiology | Admitting: Cardiology

## 2016-07-11 ENCOUNTER — Ambulatory Visit (INDEPENDENT_AMBULATORY_CARE_PROVIDER_SITE_OTHER): Payer: Medicare Other | Admitting: Pharmacist

## 2016-07-11 ENCOUNTER — Ambulatory Visit (HOSPITAL_BASED_OUTPATIENT_CLINIC_OR_DEPARTMENT_OTHER)
Admission: RE | Admit: 2016-07-11 | Discharge: 2016-07-11 | Disposition: A | Discharge status: Home / Self Care | Payer: Medicare Other | Source: Ambulatory Visit | Attending: Family | Admitting: Family

## 2016-07-11 VITALS — BP 140/92 | HR 64 | Wt 164.0 lb

## 2016-07-11 DIAGNOSIS — Z7901 Long term (current) use of anticoagulants: Secondary | ICD-10-CM | POA: Diagnosis not present

## 2016-07-11 DIAGNOSIS — I517 Cardiomegaly: Secondary | ICD-10-CM | POA: Insufficient documentation

## 2016-07-11 DIAGNOSIS — I509 Heart failure, unspecified: Secondary | ICD-10-CM | POA: Insufficient documentation

## 2016-07-11 DIAGNOSIS — I5022 Chronic systolic (congestive) heart failure: Secondary | ICD-10-CM

## 2016-07-11 DIAGNOSIS — I420 Dilated cardiomyopathy: Secondary | ICD-10-CM | POA: Insufficient documentation

## 2016-07-11 DIAGNOSIS — I428 Other cardiomyopathies: Secondary | ICD-10-CM

## 2016-07-11 LAB — BASIC METABOLIC PANEL
Anion gap: 9 (ref 5–15)
BUN: 9 mg/dL (ref 6–20)
CO2: 28 mmol/L (ref 22–32)
Calcium: 9.3 mg/dL (ref 8.9–10.3)
Chloride: 105 mmol/L (ref 101–111)
Creatinine, Ser: 0.85 mg/dL (ref 0.61–1.24)
GFR calc Af Amer: >60 mL/min (ref >60)
GFR calc non Af Amer: >60 mL/min (ref >60)
Glucose, Bld: 92 mg/dL (ref 65–99)
Potassium: 3.7 mmol/L (ref 3.5–5.1)
Sodium: 142 mmol/L (ref 135–145)

## 2016-07-11 LAB — POCT INR: INR: 3

## 2016-07-11 LAB — DIGOXIN LEVEL: Digoxin Level: 1.1 ng/mL (ref 0.8–2.0)

## 2016-07-11 MED ORDER — SACUBITRIL-VALSARTAN 24-26 MG PO TABS: 1.0000 | ORAL_TABLET | Freq: Two times a day (BID) | ORAL | 6 refills | Status: DC

## 2016-07-11 MED ORDER — DIGOXIN 250 MCG PO TABS: 125.0000 ug | ORAL_TABLET | Freq: Every day | ORAL | 1 refills | Status: DC

## 2016-07-11 MED ORDER — SILDENAFIL CITRATE 50 MG PO TABS: 25.0000 mg | ORAL_TABLET | Freq: Every day | ORAL | 0 refills | Status: DC | PRN

## 2016-07-11 NOTE — Patient Instructions (Signed)
Stop Losartan Start Entresto 24/26 mg, one tab twice a day on 07/12/16  Labs today We will only contact you if something comes back abnormal or we need to make some changes. Otherwise no news is good news!  Your physician recommends that you schedule a follow-up appointment in: 6 weeks with Rebecca Eaton

## 2016-07-11 NOTE — Progress Notes (Signed)
Patient ID: Dennis [name], male   DOB: 12-31-1960, 55 y.o.   MRN: [id number]      Advanced Heart Failure Clinic Note   HF MD: Dr Haroldine Laws.  PCP: [facility] EP: Dr Lovena Le   HPI: Dennis [name] is a 55 y/o male with h/o DM2, polysubstance use (tobacco, cocaine), mixed ICM/NICM and chronic systolic HF due to ventricular noncompaction- on chronic coumadin, noncompliance   Admitted to [facility] in 4/16 with acute CVA and with L sided weakness. CT showed R MCA stroke. Did not receive TPA due to delay in arrival. Had ECHO that showed EF 10-15% mild RV HK. No obvious LV clot. As he improved he was transferred to CIR but then developed hypotension and transferred back to HF ward. Underwent RHC on 4/27. Filling pressures mildly elevated CO ok. Diuresed gently. Meds limited by SBP in 80s. Could only tolerate ivabradine 7.5 bid, digoxin and spiro 12.5. Lasix and carvedilol stopped.   He presents today for regular follow up.  Feeling fine since last visit.  He thinks that he was never actually taking Corlanor. Continues to smoke 1/2 ppd. Denies SOB on flat ground. Mostly just SOB during sex and stairs. No bendopnea or orthopnea. Has cut back on fast food. Weight at home 162-163. Works out 3 days a week at [employer]. Walks the track and uses weight machines. Was 9 months clean as of 06/17/16.  Echo (9/17): EF 25-30%, moderate LV dilation with prominent trabeculation, normal RV size and systolic function.   Corevue: Thoracic impedence above threshold. No VT/VF.   Labs (7/17): K 3.7, creatinine 1.19, BNP 106  RHC 02/07/15 RA = 3 RV = 50/4/7 PA = 55/14 (34) PCW = 22 Fick cardiac output/index = 4.6/2.4 Thermodilution CO/CI = 4.8/2.5 PVR = 2.5 WU SVR = 1443 FA sat = 96% PA sat = 51%, 56%, 61% Noninvasive Ao pressure = 106/74 (86)   ECHO 01/2015: EF 10-15%.   CPX 02/07/2016 Peak VO2: 18.7 (54% predicted peak VO2) VE/VCO2 slope: 30 OUES: 1.62 Peak RER:  1.13 Ventilatory Threshold: 10.9 (32% predicted or  measured peak VO2)   Past Medical History:  Diagnosis Date  . Automatic implantable cardioverter-defibrillator in situ   . CHF (congestive heart failure) ([place])   . Chronic systolic heart failure (HCC)    a) Mixed ICM/NICM b) RHC (05/2014): RA 2, RV 19/2/3, PA 22/14 (18), PCWP 6, Fick CO/CI: 5.2 / 2.7, PVR 2.3 WU, PA 60% and 64% c) ECHO (05/2014): EF 20-25%, diff HK, akinesis entireanteroseptal myocardium, triv AI, mod MR, LA mod/sev dilated  . Drug abuse and dependence ([place])   . Heart murmur   . High cholesterol   . Ischemic cardiomyopathy    a) Coronary angiography (12/2008) at [facility]: Lmain: nl, LAD mid 100% stenosis with left to left and right-to-left collaterals to the distal LAD; Lcx: nl, RCA nl.    . 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[place])   . Pneumonia 1988  . Sleep apnea    "cleared after T&A"  . Type II diabetes mellitus ([employer])      Current Outpatient Prescriptions on File Prior to Encounter  Medication Sig Dispense Refill  . atorvastatin (LIPITOR) 20 MG tablet Take 20 mg by mouth daily.  0  . carvedilol (COREG) 3.125 MG tablet TAKE 1 TABLET BY MOUTH 2 TIMES DAILY WITH A MEAL. 60 tablet 1  . DIGOX 250 MCG tablet TAKE 1 TABLET BY MOUTH DAILY. 30 tablet 1  . furosemide (LASIX) 40 MG tablet TAKE 1 TABLET BY  MOUTH EVERY DAY 30 tablet 3  . glucose blood (ONETOUCH VERIO) test strip Use as instructed 100 each 12  . Insulin Glargine (LANTUS SOLOSTAR) 100 UNIT/ML Solostar Pen Inject 10 Units into the skin daily at 10 pm.     . losartan (COZAAR) 25 MG tablet Take 25 mg by mouth daily.  11  . QC PEN NEEDLES 31G X 6 MM MISC 1 each by Other route See admin instructions. Reported on 12/18/2015    . warfarin (COUMADIN) 7.5 MG tablet Take as directed by coumadin clinic 35 tablet 1   No current facility-administered medications on file prior to encounter.       No Known Allergies   Social History   Social History  . Marital status: Single    Spouse name: N/A  . Number of children: 1  .  Years of education: 69   Occupational History  . Disabled    Social History Main Topics  . Smoking status: Current Some Day Smoker    Packs/day: 0.25    Years: 31.00    Types: Cigarettes  . Smokeless tobacco: Never Used  . Alcohol use 0.0 oz/week     Comment: 09/21/2014 "might have a drink q 6 /months, if that"  . Drug use:     Types: Cocaine     Comment: 09/21/2014 "last cocaine ~ 1 1/2 months ago"  . Sexual activity: Yes   Other Topics Concern  . Not on file   Social History Narrative   Lives in [place] with his mother. No pets. Fun: movies, sports, daughter.    Denies religious beliefs that would effect health care.               Family History  Problem Relation Age of Onset  . Diabetes Mother   . Heart disease Mother   . Diabetes Father   . Prostate cancer Father   . Heart disease Father   . Diabetes Brother   . Diabetes Brother      Vitals:   07/11/16 1201  BP: (!) 140/92  Pulse: 64  SpO2: 98%  Weight: 164 lb (74.4 kg)   Wt Readings from Last 3 Encounters:  07/11/16 164 lb (74.4 kg)  05/06/16 159 lb (72.1 kg)  02/21/16 161 lb 9.6 oz (73.3 kg)       PHYSICAL EXAM: General:  Well appearing. NAD HEENT: normal Neck: supple. JVD not elevated.Carotids 2+ bilat; no bruits. No thyromegaly or lymphadenopathy noted.  Cor: PMI laterally displaced. RRR. No M/G/R appreciated Lungs: Clear, no resp distress Abdomen: soft, non-tender, non-distended, no HSM. No bruits or masses. +BS  Extremities: no cyanosis, clubbing, rash. No peripheral edema.  Neuro: alert & oriented x 3, cranial nerves grossly intact. moves all 4 extremities w/o difficulty. Affect pleasant.   ASSESSMENT & PLAN: 1. Chronic Systolic Heart Failure- 4166 ECHO EF 10-15% with non compaction s/p ICD - St Jude.- Followed by Dr Lovena Le. Discussed CPX results. Moderate HF limitation/restrictive lung physiology. Can repeat in 6 months-12 months.  No need to pursue advanced therapies at this time.    - Echo today with EF slightly improved to 25-30% range.  --NYHA II symptoms. Seems to be doing well. Volume status stable.Continue lasix 40 mg daily.  - Not on corlanor. Will not restart with HR < 75. - Continue coreg 3.125 mg BID.  - Continue digoxin 0.25 mcg, dig level < 0.2  11/2015.  - Stop losartan.  Start Entresto 24/26 mg BID tomorrow.  - Reinforced low  salt diet  - Stressed importance of daily weights and limiting fluid to <2000 ml daily (64 oz). 2. Hx of embolic CVA - INR per coumadin clinic    3. CAD Coronary angiography (12/2008) with mid LAD totally occluded and collateralized. - Continue atorvastatin 20 mg daily.  - No ASA as he has no symptoms and is on coumadin for LV  noncompaction.  4. DMII - Followed by PCP. Decreasing his diabetic medications at their direction with improved compliance. He is even being considered for stopping Insulin completely.  5. Polysubstance abuse - has not used since 09/16/2015.  6. Tobacco Abuse - Declines cessation at this time.  7. Non-compliance-  - Has improved, apart from ivabridine as above. Will continue to review medications with ongoing education for this.   8. Erectile dysfunction - Per Dr. Aundra Dubin OK to try 25 mg viagra as needed. Warned not to take precaution if BP low. Do not want him to take any stronger dose.   BMET and digoxin today. Recheck BMET 10 days with switch to [employer].   Follow up 6 weeks for med titration.    Satira Mccallum Tillery PA-C  07/11/16   Patient seen with PA, agree with the above note.  Echo shows mildly improved EF, 25-30%.  Prominent LV trabeculations could indicate LV noncompaction. Overall doing well, NYHA class II. - BMET/digoxin today.  - Stop losartan, start Entresto 24/26 bid with BMET in 2 wks.  - Followup 6 wks.   Loralie Champagne 07/14/2016

## 2016-07-11 NOTE — Telephone Encounter (Signed)
Notes Recorded by Harvie Junior, CMA on 07/11/2016 at 4:26 PM EDT Patient aware. Dig trough added to lab visit 10/9  ------  Notes Recorded by Larey Dresser, MD on 07/11/2016 at 4:09 PM EDT Can decrease digoxin to 0.125 daily, repeat digoxin level as trough in 10 days.    Ref Range & Units 12:52 55moago 171mogo   Digoxin Level 0.8 - 2.0 ng/mL 1.1  <0.2CM   <0.2CM

## 2016-07-21 ENCOUNTER — Ambulatory Visit (HOSPITAL_COMMUNITY): Admission: RE | Admit: 2016-07-21 | Payer: Medicare Other | Source: Ambulatory Visit

## 2016-07-25 ENCOUNTER — Ambulatory Visit (HOSPITAL_COMMUNITY)
Admission: RE | Admit: 2016-07-25 | Discharge: 2016-07-25 | Disposition: A | Discharge status: Home / Self Care | Payer: Medicare Other | Source: Ambulatory Visit | Attending: Cardiology | Admitting: Cardiology

## 2016-07-25 ENCOUNTER — Encounter: Payer: Self-pay | Admitting: Cardiovascular Disease

## 2016-07-25 ENCOUNTER — Ambulatory Visit (INDEPENDENT_AMBULATORY_CARE_PROVIDER_SITE_OTHER): Payer: Medicare Other | Admitting: *Deleted

## 2016-07-25 DIAGNOSIS — I428 Other cardiomyopathies: Secondary | ICD-10-CM

## 2016-07-25 DIAGNOSIS — I5023 Acute on chronic systolic (congestive) heart failure: Secondary | ICD-10-CM | POA: Diagnosis not present

## 2016-07-25 DIAGNOSIS — Z7901 Long term (current) use of anticoagulants: Secondary | ICD-10-CM

## 2016-07-25 DIAGNOSIS — I5022 Chronic systolic (congestive) heart failure: Secondary | ICD-10-CM | POA: Diagnosis present

## 2016-07-25 LAB — POCT INR: INR: 2.4

## 2016-07-25 LAB — DIGOXIN LEVEL: Digoxin Level: 0.4 ng/mL — ABNORMAL LOW (ref 0.8–2.0)

## 2016-08-04 ENCOUNTER — Other Ambulatory Visit (HOSPITAL_COMMUNITY): Payer: Self-pay | Admitting: Internal Medicine

## 2016-08-06 ENCOUNTER — Telehealth: Payer: Self-pay | Admitting: Cardiology

## 2016-08-06 ENCOUNTER — Encounter: Payer: Medicare Other | Admitting: *Deleted

## 2016-08-06 NOTE — Telephone Encounter (Signed)
Spoke with pt and reminded pt of remote transmission that is due today. Pt verbalized understanding.   

## 2016-08-07 ENCOUNTER — Encounter: Payer: Self-pay | Admitting: Cardiology

## 2016-08-18 ENCOUNTER — Other Ambulatory Visit (HOSPITAL_COMMUNITY): Payer: Self-pay | Admitting: Internal Medicine

## 2016-08-22 ENCOUNTER — Encounter (HOSPITAL_COMMUNITY): Payer: Self-pay

## 2016-08-25 ENCOUNTER — Encounter (HOSPITAL_COMMUNITY): Payer: Self-pay

## 2016-08-25 ENCOUNTER — Ambulatory Visit (INDEPENDENT_AMBULATORY_CARE_PROVIDER_SITE_OTHER): Payer: Medicare Other | Admitting: *Deleted

## 2016-08-25 ENCOUNTER — Ambulatory Visit (HOSPITAL_COMMUNITY)
Admission: RE | Admit: 2016-08-25 | Discharge: 2016-08-25 | Disposition: A | Discharge status: Home / Self Care | Payer: Medicare Other | Source: Ambulatory Visit | Attending: Cardiology | Admitting: Cardiology

## 2016-08-25 VITALS — BP 126/80 | HR 73 | Wt 168.2 lb

## 2016-08-25 DIAGNOSIS — Z794 Long term (current) use of insulin: Secondary | ICD-10-CM | POA: Diagnosis not present

## 2016-08-25 DIAGNOSIS — I10 Essential (primary) hypertension: Secondary | ICD-10-CM | POA: Diagnosis not present

## 2016-08-25 DIAGNOSIS — I2510 Atherosclerotic heart disease of native coronary artery without angina pectoris: Secondary | ICD-10-CM | POA: Diagnosis not present

## 2016-08-25 DIAGNOSIS — I2582 Chronic total occlusion of coronary artery: Secondary | ICD-10-CM | POA: Diagnosis not present

## 2016-08-25 DIAGNOSIS — N529 Male erectile dysfunction, unspecified: Secondary | ICD-10-CM | POA: Insufficient documentation

## 2016-08-25 DIAGNOSIS — Z95810 Presence of automatic (implantable) cardiac defibrillator: Secondary | ICD-10-CM | POA: Insufficient documentation

## 2016-08-25 DIAGNOSIS — I5022 Chronic systolic (congestive) heart failure: Secondary | ICD-10-CM | POA: Insufficient documentation

## 2016-08-25 DIAGNOSIS — E785 Hyperlipidemia, unspecified: Secondary | ICD-10-CM | POA: Diagnosis not present

## 2016-08-25 DIAGNOSIS — Z8673 Personal history of transient ischemic attack (TIA), and cerebral infarction without residual deficits: Secondary | ICD-10-CM | POA: Insufficient documentation

## 2016-08-25 DIAGNOSIS — Z720 Tobacco use: Secondary | ICD-10-CM

## 2016-08-25 DIAGNOSIS — Z79899 Other long term (current) drug therapy: Secondary | ICD-10-CM | POA: Diagnosis not present

## 2016-08-25 DIAGNOSIS — I428 Other cardiomyopathies: Secondary | ICD-10-CM

## 2016-08-25 DIAGNOSIS — E119 Type 2 diabetes mellitus without complications: Secondary | ICD-10-CM | POA: Insufficient documentation

## 2016-08-25 DIAGNOSIS — Z0001 Encounter for general adult medical examination with abnormal findings: Secondary | ICD-10-CM | POA: Insufficient documentation

## 2016-08-25 DIAGNOSIS — F17210 Nicotine dependence, cigarettes, uncomplicated: Secondary | ICD-10-CM | POA: Insufficient documentation

## 2016-08-25 DIAGNOSIS — Z7901 Long term (current) use of anticoagulants: Secondary | ICD-10-CM

## 2016-08-25 DIAGNOSIS — Z9119 Patient's noncompliance with other medical treatment and regimen: Secondary | ICD-10-CM | POA: Insufficient documentation

## 2016-08-25 LAB — BASIC METABOLIC PANEL
Anion gap: 6 (ref 5–15)
BUN: 11 mg/dL (ref 6–20)
CO2: 27 mmol/L (ref 22–32)
Calcium: 9 mg/dL (ref 8.9–10.3)
Chloride: 106 mmol/L (ref 101–111)
Creatinine, Ser: 0.9 mg/dL (ref 0.61–1.24)
GFR calc Af Amer: >60 mL/min (ref >60)
GFR calc non Af Amer: >60 mL/min (ref >60)
Glucose, Bld: 160 mg/dL — ABNORMAL HIGH (ref 65–99)
Potassium: 3.9 mmol/L (ref 3.5–5.1)
Sodium: 139 mmol/L (ref 135–145)

## 2016-08-25 LAB — POCT INR: INR: 3.4

## 2016-08-25 MED ORDER — SACUBITRIL-VALSARTAN 24-26 MG PO TABS: 1.0000 | ORAL_TABLET | Freq: Two times a day (BID) | ORAL | 6 refills | Status: DC

## 2016-08-25 MED ORDER — SILDENAFIL CITRATE 50 MG PO TABS: 25.0000 mg | ORAL_TABLET | Freq: Every day | ORAL | 0 refills | Status: DC | PRN

## 2016-08-25 NOTE — Patient Instructions (Signed)
INCREASE Entresto to 24/26 mg, one tab TWICE a day  Labs today We will only contact you if something comes back abnormal or we need to make some changes. Otherwise no news is good news! Labs needed in 10-14 days  Your physician recommends that you schedule a follow-up appointment in: 2 weeks with Doroteo Bradford, PharmD And 6 weeks with Dr Aundra Dubin  Do the following things EVERYDAY: 1) Weigh yourself in the morning before breakfast. Write it down and keep it in a log. 2) Take your medicines as prescribed 3) Eat low salt foods-Limit salt (sodium) to 2000 mg per day.  4) Stay as active as you can everyday 5) Limit all fluids for the day to less than 2 liters

## 2016-08-25 NOTE — Progress Notes (Signed)
Patient ID: Dennis [name], male   DOB: 1960/11/27, 55 y.o.   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 CT showed R MCA stroke. Did not receive TPA due to delay in arrival. Had ECHO that showed EF 10-15% mild RV HK. No obvious LV clot. As he improved he was transferred to CIR but then developed hypotension and transferred back to HF ward. Underwent RHC on 4/27. Filling pressures mildly elevated CO ok. Diuresed gently. Meds limited by SBP in 80s. Could only tolerate ivabradine 7.5 bid, digoxin and spiro 12.5. Lasix and carvedilol stopped.   He presents today for regular follow up. Has been feeling pretty good since last visit.  Feeling fine since last visit. Using viagra to good effect.  Not getting as SOB during sex any more. Smoking 1/2 ppd. Denies DOE on flat ground. Gets a little winded going up steps. Denies bendopnea or orthopnea. Has put on a few lbs.  Will be 1 year sober from drugs and ETOH on 09/16/2016. Works out 3 days a week walking and using weight machines.  Echo (9/17): EF 25-30%, moderate LV dilation with prominent trabeculation, normal RV size and systolic function.   Labs (7/17): K 3.7, creatinine 1.19, BNP 106  RHC 02/07/15 RA = 3 RV = 50/4/7 PA = 55/14 (34) PCW = 22 Fick cardiac output/index = 4.6/2.4 Thermodilution CO/CI = 4.8/2.5 PVR = 2.5 WU SVR = 1443 FA sat = 96% PA sat = 51%, 56%, 61% Noninvasive Ao pressure = 106/74 (86)   ECHO 01/2015: EF 10-15%.   CPX 02/07/2016 Peak VO2: 18.7 (54% predicted peak VO2) VE/VCO2 slope: 30 OUES: 1.62 Peak RER:  1.13 Ventilatory Threshold: 10.9 (32% predicted or measured peak VO2)   Past Medical  History:  Diagnosis Date  . Automatic implantable cardioverter-defibrillator in situ   . CHF (congestive heart failure) ([place])   . Chronic systolic heart failure (HCC)    a) Mixed ICM/NICM b) RHC (05/2014): RA 2, RV 19/2/3, PA 22/14 (18), PCWP 6, Fick CO/CI: 5.2 / 2.7, PVR 2.3 WU, PA 60% and 64% c) ECHO (05/2014): EF 20-25%, diff HK, akinesis entireanteroseptal myocardium, triv AI, mod MR, LA mod/sev dilated  . Drug abuse and dependence ([place])   . Heart murmur   . High cholesterol   . Ischemic cardiomyopathy    a) [facility]: Lmain: nl, LAD mid 100% stenosis with left to left and right-to-left collaterals to the distal LAD; Lcx: nl, RCA nl.    . 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[place])   . Pneumonia 1988  . Sleep apnea    "cleared after T&A"  . Type II diabetes mellitus ([place])      Current Outpatient Prescriptions on File Prior to Encounter  Medication Sig Dispense Refill  . atorvastatin (LIPITOR) 20 MG tablet Take 20 mg by mouth daily.  0  . carvedilol (COREG) 3.125 MG tablet TAKE 1 TABLET BY MOUTH 2 TIMES DAILY WITH A MEAL. 60 tablet 1  . digoxin (DIGOX) 0.25 MG tablet Take 0.5 tablets (125 mcg total) by mouth daily. 15 tablet 1  . furosemide (LASIX) 40 MG tablet TAKE 1 TABLET BY  MOUTH EVERY DAY 30 tablet 3  . glucose blood (ONETOUCH VERIO) test strip Use as instructed 100 each 12  . Insulin Glargine (LANTUS SOLOSTAR) 100 UNIT/ML Solostar Pen Inject 10 Units into the skin daily at 10 pm.     . QC PEN NEEDLES 31G X 6 MM MISC 1 each by Other route See admin instructions. Reported on 12/18/2015    . sildenafil (VIAGRA) 50 MG tablet Take 0.5 tablets (25 mg total) by mouth daily as needed for erectile dysfunction. 5 tablet 0  . warfarin (COUMADIN) 7.5 MG tablet Take as directed by coumadin clinic 35 tablet 1   No current facility-administered medications on file prior to encounter.       No Known Allergies   Social History   Social History  . Marital status:  Single    Spouse name: N/A  . Number of children: 1  . Years of education: 39   Occupational History  . Disabled    Social History Main Topics  . Smoking status: Current Some Day Smoker    Packs/day: 0.25    Years: 31.00    Types: Cigarettes  . Smokeless tobacco: Never Used  . Alcohol use 0.0 oz/week     Comment: 09/21/2014 "might have a drink q 6 /months, if that"  . Drug use:     Types: Cocaine     Comment: 09/21/2014 "last cocaine ~ 1 1/2 months ago"  . Sexual activity: Yes   Other Topics Concern  . Not on file   Social History Narrative   Lives in [place] with his mother. No pets. Fun: movies, sports, daughter.    Denies religious beliefs that would effect health care.               Family History  Problem Relation Age of Onset  . Diabetes Mother   . Heart disease Mother   . Diabetes Father   . Prostate cancer Father   . Heart disease Father   . Diabetes Brother   . Diabetes Brother      Vitals:   08/25/16 1418  BP: 126/80  Pulse: 73  SpO2: 99%  Weight: 168 lb 3.2 oz (76.3 kg)   Wt Readings from Last 3 Encounters:  08/25/16 168 lb 3.2 oz (76.3 kg)  07/11/16 164 lb (74.4 kg)  05/06/16 159 lb (72.1 kg)       PHYSICAL EXAM: General:  Well appearing. NAD HEENT: normal Neck: supple. JVD not elevated.Carotids 2+ bilat; no bruits. No thyromegaly or nodule noted.   Cor: PMI laterally displaced. RRR. No murmurs, gallops, or rubs.  Lungs: CTAB, normal effort Abdomen: soft, NT, ND, no HSM. No bruits or masses. +BS  Extremities: no cyanosis, clubbing, rash. No peripheral edema.  Neuro: alert & oriented x 3, cranial nerves grossly intact. moves all 4 extremities w/o difficulty. Affect pleasant.   ASSESSMENT & PLAN: 1. Chronic Systolic Heart Failure- 4403 ECHO EF 10-15% with non compaction s/p ICD - St Jude.- Followed by Dr Lovena Le.  - CPX 02/07/16 with Moderate HF limitation/restrictive lung physiology. Can repeat in 6 months-12 months.  No need to  pursue advanced therapies at this time.  - Echo 07/11/16 with EF slightly improved to 25-30% range. - Stable to improved with NYHA II symptoms. Overall feels better.  - Continue lasix 40 mg daily. Can take extra as needed. - Not on corlanor. Will not restart with HR < 75. - Continue coreg 3.125 mg BID.  - Continue digoxin 0.25 mcg, dig  level < 0.2  11/2015.  - Increase Entresto 24/26 mg to BID. He has been taking once daily. BMET today and recheck 10-14 days.   - Reinforced low salt diet  - Stressed importance of daily weights and limiting fluid to <2000 ml daily (64 oz). 2. Hx of embolic CVA - INR per coumadin clinic    3. CAD Coronary angiography (12/2008) with mid LAD totally occluded and collateralized. - Continue atorvastatin 20 mg daily.  - No ASA as he has no symptoms and is on coumadin for LV  noncompaction.  4. DMII - Followed by PCP. - Requiring less diabetic medications.   5. Polysubstance abuse  - has not used since 09/16/2015 as above. 6. Tobacco Abuse - Continues to attempt cessation.  7. Non-compliance-  - Has improved, apart from ivabridine as above. Will continue to review medications with ongoing education for this.   8. Erectile dysfunction - Continue 25 mg Viagra as needed. Should not take any higher dose.  BMET today. Repeat in 10-14 days since he had only been taking daily Entresto. Follow up with pharmacy in 2 weeks and provider in 6 weeks.   Shirley Friar PA-C  08/25/16

## 2016-08-26 ENCOUNTER — Other Ambulatory Visit (HOSPITAL_COMMUNITY): Payer: Self-pay | Admitting: Pharmacist

## 2016-08-26 MED ORDER — TADALAFIL 20 MG PO TABS: 20.0000 mg | ORAL_TABLET | Freq: Every day | ORAL | 0 refills | Status: DC | PRN

## 2016-08-27 ENCOUNTER — Telehealth (HOSPITAL_COMMUNITY): Payer: Self-pay | Admitting: Pharmacist

## 2016-08-27 NOTE — Telephone Encounter (Signed)
Attempted PA for Cialis. No ED medications are covered under Medicare.   Ruta Hinds. Velva Harman, PharmD, BCPS, CPP Clinical Pharmacist Pager: [phone] Phone: [phone] 08/27/2016 3:46 PM

## 2016-09-08 ENCOUNTER — Encounter (HOSPITAL_COMMUNITY): Payer: Self-pay

## 2016-09-08 ENCOUNTER — Ambulatory Visit (HOSPITAL_COMMUNITY)
Admission: RE | Admit: 2016-09-08 | Discharge: 2016-09-08 | Disposition: A | Discharge status: Home / Self Care | Payer: Medicare Other | Source: Ambulatory Visit | Attending: Cardiology | Admitting: Cardiology

## 2016-09-08 VITALS — BP 110/74 | HR 85 | Wt 165.0 lb

## 2016-09-08 DIAGNOSIS — E119 Type 2 diabetes mellitus without complications: Secondary | ICD-10-CM | POA: Diagnosis not present

## 2016-09-08 DIAGNOSIS — I2510 Atherosclerotic heart disease of native coronary artery without angina pectoris: Secondary | ICD-10-CM | POA: Insufficient documentation

## 2016-09-08 DIAGNOSIS — I429 Cardiomyopathy, unspecified: Secondary | ICD-10-CM | POA: Diagnosis not present

## 2016-09-08 DIAGNOSIS — I509 Heart failure, unspecified: Secondary | ICD-10-CM

## 2016-09-08 DIAGNOSIS — Z8673 Personal history of transient ischemic attack (TIA), and cerebral infarction without residual deficits: Secondary | ICD-10-CM | POA: Diagnosis not present

## 2016-09-08 DIAGNOSIS — I5022 Chronic systolic (congestive) heart failure: Secondary | ICD-10-CM | POA: Insufficient documentation

## 2016-09-08 DIAGNOSIS — Z5189 Encounter for other specified aftercare: Secondary | ICD-10-CM | POA: Insufficient documentation

## 2016-09-08 DIAGNOSIS — Z720 Tobacco use: Secondary | ICD-10-CM | POA: Insufficient documentation

## 2016-09-08 LAB — BASIC METABOLIC PANEL
Anion gap: 7 (ref 5–15)
BUN: 14 mg/dL (ref 6–20)
CO2: 27 mmol/L (ref 22–32)
Calcium: 9.5 mg/dL (ref 8.9–10.3)
Chloride: 105 mmol/L (ref 101–111)
Creatinine, Ser: 1.02 mg/dL (ref 0.61–1.24)
GFR calc Af Amer: >60 mL/min (ref >60)
GFR calc non Af Amer: >60 mL/min (ref >60)
Glucose, Bld: 167 mg/dL — ABNORMAL HIGH (ref 65–99)
Potassium: 4 mmol/L (ref 3.5–5.1)
Sodium: 139 mmol/L (ref 135–145)

## 2016-09-08 NOTE — Progress Notes (Signed)
HPI:  Dennis [name] is a 55 y/o AA male with h/o DM2, polysubstance use (tobacco, cocaine) will be 1 year sober 09/16/16, mixed ICM/NICM and chronic systolic HF due to ventricular noncompaction - on chronic coumadin.  Admitted to [facility] in 4/16 with acute CVA and with L sided weakness. CT showed R MCA stroke. Did not receive TPA due to delay in arrival. Had ECHO that showed EF 10-15% mild RV HK. No obvious LV clot. As he improved he was transferred to CIR but then developed hypotension and transferred back to HF ward. Underwent RHC on 4/27. Filling pressures mildly elevated CO ok. Diuresed gently. Meds limited by SBP in 80s. Could only tolerate ivabradine 7.5 bid, digoxin and spiro 12.5. Lasix and carvedilol stopped.   At last follow up appointment, patient was on lasix 40 daily, digoxin, coreg 3.125 BID, and entresto 24/26. He had no complaints during that time. He was only taking Entresto once daily and was instructed to start taking BID.   He presents today for pharmacist lead heart failure medication titration. Has been feeling the same since his last visit, but is excited about his 1 year of sobriety coming up. He has been using Cialis as needed which was changed from Viagra for coverage issues. Some SOB only during sexual activity and when walking up steps. Is still working out at least 3 days a week walking and using weight machines.  Echo (4/16): EF 10-15%.  Echo (9/17): EF 25-30%, moderate LV dilation with prominent trabeculation, normal RV size and systolic function.   . Shortness of breath/dyspnea on exertion? Yes - this is not better or worse and is only with sexual activity and walking up inclines  . Orthopnea/PND? No  . Edema? No  . Lightheadedness/dizziness? No  . Daily weights at home? Yes  . Blood pressure/heart rate monitoring at home? No  . Following low-sodium/fluid-restricted diet? Yes   HF Medications: Furosemide 40 mg daily  Coreg 3.125 mg BID.  Digoxin 0.125 mcg daily Entresto  24/26 mg BID  Has the patient been experiencing any side effects to the medications prescribed?  No   Does the patient have any problems obtaining medications due to transportation or finances?   No   Understanding of regimen: excellent Understanding of indications: excellent Potential of compliance: excellent Patient understands to avoid NSAIDs. Patient understands to avoid decongestants.    Pertinent Lab Values: . 11/27: Serum creatinine 1.02 (BL ~0.9), BUN 14, Potassium 4.0, Sodium 139 . 07/25/2016: dig level 0.4  Vital Signs: . Weight: 165 lb (dry weight: 162-163) . Blood pressure: 110/74 mmHg . Heart rate: 85 bpm  Assessment: 1. Chronicsystolic CHF (EF 98-33%), due to mixed ICM/NICM and chronic systolic HF due to ventricular noncompaction. NYHA class I-IIsymptoms.  - Volume status stable   - No complaints with medications except for occasional dizziness upon waking - Continue lasix 40 mg daily (can take extra as needed), coreg 3.125 mg BID, digoxin 0.125 mcg daily, Entresto 24/26 mg BID - Could consider increase in carvedilol or addition of low dose spironolactone at next visit as long as BP/HR tolerates - Basic disease state pathophysiology, medication indication, mechanism and side effects reviewed at length with patient and he verbalized understanding 2. Hx of embolic CVA on warfarin - INR per coumadin clinic    3. CAD Coronary angiography (12/2008) with mid LAD totally occluded and collateralized. - Continue atorvastatin 20 mg daily - No ASA as he has no symptoms and is on coumadin for LV  noncompaction 4.  DMII - Followed by PCP. On insulin 5. Polysubstance abuse  - Has not used since 09/16/2015 as above 6. Tobacco Abuse - Continues to attempt cessation 7. Non-compliance - This has improved. Was able to verbalize all medications and doses easily during visit 8. Erectile dysfunction - Controlled on Cialis  Plan: 1) Medication changes: Based on clinical  presentation, vital signs and recent labs will continue current medications as prescribed. Could consider adding spironolactone or increasing carvedilol at next visit if BP will tolerate. 2) Labs: BMET today 3) Follow-up: Dr. Haroldine Laws on January 4th, 2018 at [facility], PharmD, PGY-2  Agree with above.  Ruta Hinds. Velva Harman, PharmD, BCPS, CPP Clinical Pharmacist Pager: [phone] Phone: [phone] 09/08/2016 2:14 PM   Agree with above.   Bensimhon, Daniel,MD 8:00 PM

## 2016-09-08 NOTE — Patient Instructions (Signed)
It was good to see you today.  Please keep taking your medications exactly how you are.  We are getting blood work today 09/08/16 and will call you if there are any problems  You have a follow up appointment on January 4th, 2018 with Dr. Haroldine Laws

## 2016-09-15 ENCOUNTER — Ambulatory Visit (INDEPENDENT_AMBULATORY_CARE_PROVIDER_SITE_OTHER): Payer: Medicare Other

## 2016-09-15 DIAGNOSIS — Z7901 Long term (current) use of anticoagulants: Secondary | ICD-10-CM

## 2016-09-15 DIAGNOSIS — I428 Other cardiomyopathies: Secondary | ICD-10-CM

## 2016-09-15 LAB — POCT INR: INR: 3.1

## 2016-09-29 ENCOUNTER — Ambulatory Visit (INDEPENDENT_AMBULATORY_CARE_PROVIDER_SITE_OTHER): Payer: Medicare Other | Admitting: *Deleted

## 2016-09-29 DIAGNOSIS — I428 Other cardiomyopathies: Secondary | ICD-10-CM

## 2016-09-29 DIAGNOSIS — Z7901 Long term (current) use of anticoagulants: Secondary | ICD-10-CM

## 2016-09-29 LAB — POCT INR: INR: 1.6

## 2016-10-10 ENCOUNTER — Encounter (HOSPITAL_COMMUNITY): Payer: Self-pay

## 2016-10-15 ENCOUNTER — Other Ambulatory Visit (HOSPITAL_COMMUNITY): Payer: Self-pay | Admitting: Internal Medicine

## 2016-10-16 ENCOUNTER — Encounter (HOSPITAL_COMMUNITY): Payer: Self-pay

## 2016-10-16 ENCOUNTER — Ambulatory Visit (INDEPENDENT_AMBULATORY_CARE_PROVIDER_SITE_OTHER): Payer: Medicare Other | Admitting: *Deleted

## 2016-10-16 ENCOUNTER — Ambulatory Visit (HOSPITAL_COMMUNITY)
Admission: RE | Admit: 2016-10-16 | Discharge: 2016-10-16 | Disposition: A | Discharge status: Home / Self Care | Payer: Medicare Other | Source: Ambulatory Visit | Attending: Internal Medicine | Admitting: Internal Medicine

## 2016-10-16 VITALS — BP 110/80 | HR 82 | Wt 167.0 lb

## 2016-10-16 DIAGNOSIS — I509 Heart failure, unspecified: Secondary | ICD-10-CM

## 2016-10-16 DIAGNOSIS — I255 Ischemic cardiomyopathy: Secondary | ICD-10-CM | POA: Diagnosis not present

## 2016-10-16 DIAGNOSIS — E119 Type 2 diabetes mellitus without complications: Secondary | ICD-10-CM | POA: Insufficient documentation

## 2016-10-16 DIAGNOSIS — I5022 Chronic systolic (congestive) heart failure: Secondary | ICD-10-CM | POA: Diagnosis not present

## 2016-10-16 DIAGNOSIS — Z720 Tobacco use: Secondary | ICD-10-CM

## 2016-10-16 DIAGNOSIS — Z8673 Personal history of transient ischemic attack (TIA), and cerebral infarction without residual deficits: Secondary | ICD-10-CM | POA: Diagnosis not present

## 2016-10-16 DIAGNOSIS — Z95810 Presence of automatic (implantable) cardiac defibrillator: Secondary | ICD-10-CM | POA: Insufficient documentation

## 2016-10-16 DIAGNOSIS — Z5189 Encounter for other specified aftercare: Secondary | ICD-10-CM | POA: Diagnosis not present

## 2016-10-16 DIAGNOSIS — Z794 Long term (current) use of insulin: Secondary | ICD-10-CM | POA: Diagnosis not present

## 2016-10-16 DIAGNOSIS — I428 Other cardiomyopathies: Secondary | ICD-10-CM

## 2016-10-16 DIAGNOSIS — Z79899 Other long term (current) drug therapy: Secondary | ICD-10-CM | POA: Insufficient documentation

## 2016-10-16 DIAGNOSIS — Z7901 Long term (current) use of anticoagulants: Secondary | ICD-10-CM | POA: Diagnosis not present

## 2016-10-16 MED ORDER — SPIRONOLACTONE 25 MG PO TABS
12.5000 mg | ORAL_TABLET | Freq: Every day | ORAL | 3 refills | Status: DC
Start: 2016-10-16 — End: 2018-01-25

## 2016-10-16 MED ORDER — NICOTINE 21 MG/24HR TD PT24: 21.0000 mg | MEDICATED_PATCH | Freq: Every day | TRANSDERMAL | 0 refills | Status: DC

## 2016-10-16 NOTE — Patient Instructions (Addendum)
START Spironolactone 12.5 mg (1/2 tablet) once daily.  Return for labwork in 10-14 days.  START Nicotine patches. Instructions on package. Call when you run out of 21 mg patches to get 14 mg patches sent in to pharmacy. Once 14 mg patches finished, will send in 7 mg patches.  Follow up 6-8 weeks with Amy Clegg NP-C.  Do the following things EVERYDAY: 1) Weigh yourself in the morning before breakfast. Write it down and keep it in a log. 2) Take your medicines as prescribed 3) Eat low salt foods-Limit salt (sodium) to 2000 mg per day.  4) Stay as active as you can everyday 5) Limit all fluids for the day to less than 2 liters

## 2016-10-16 NOTE — Progress Notes (Signed)
Patient ID: Dennis [name], male   DOB: 06-26-1961, 56 y.o.   MRN: [id number]      Advanced Heart Failure Clinic Note   HF MD: Dr Haroldine Laws PCP: [facility] EP: Dr Lovena Le   HPI: Dennis [name] is a 56 y/o male with h/o DM2, polysubstance use (tobacco, cocaine), mixed ICM/NICM and chronic systolic HF due to ventricular noncompaction- on chronic coumadin, noncompliance   Admitted to [facility] in 4/16 with acute CVA and with L sided weakness. CT showed R MCA stroke. Did not receive TPA due to delay in arrival. Had ECHO that showed EF 10-15% mild RV HK. No obvious LV clot. As he improved he was transferred to CIR but then developed hypotension and transferred back to HF ward. Underwent RHC on 4/27. Filling pressures mildly elevated CO ok. Diuresed gently. Meds limited by SBP in 80s. Could only tolerate ivabradine 7.5 bid, digoxin and spiro 12.5. Lasix and carvedilol stopped.   He presents today for HF follow up. Overall feeling ok. SOB with steps. Denies PND. SOB walking around the grocery store. Weight at home 160-161 pounds. Not exercising much.  Smoking 1PPD. Has not had any alcohol or drugs in over a year. He continues attend NA meetings.   Echo (9/17): EF 25-30%, moderate LV dilation with prominent trabeculation, normal RV size and systolic function.   Labs (7/17): K 3.7, creatinine 1.19, BNP 106  RHC 02/07/15 RA = 3 RV = 50/4/7 PA = 55/14 (34) PCW = 22 Fick cardiac output/index = 4.6/2.4 Thermodilution CO/CI = 4.8/2.5 PVR = 2.5 WU SVR = 1443 FA sat = 96% PA sat = 51%, 56%, 61%   Noninvasive Ao pressure = 106/74 (86)   ECHO 01/2015: EF 10-15%.   CPX 02/07/2016 Peak VO2: 18.7 (54% predicted peak VO2) VE/VCO2 slope: 30 OUES: 1.62 Peak RER:  1.13 Ventilatory Threshold: 10.9 (32% predicted or measured peak VO2)   Past Medical History:  Diagnosis Date  . Automatic implantable cardioverter-defibrillator in situ   . CHF (congestive heart failure) ([place])   . Chronic systolic heart failure  (HCC)    a) Mixed ICM/NICM b) RHC (05/2014): RA 2, RV 19/2/3, PA 22/14 (18), PCWP 6, Fick CO/CI: 5.2 / 2.7, PVR 2.3 WU, PA 60% and 64% c) ECHO (05/2014): EF 20-25%, diff HK, akinesis entireanteroseptal myocardium, triv AI, mod MR, LA mod/sev dilated  . Drug abuse and dependence ([place])   . Heart murmur   . High cholesterol   . Ischemic cardiomyopathy    a) Coronary angiography (12/2008) at [facility]: Lmain: nl, LAD mid 100% stenosis with left to left and right-to-left collaterals to the distal LAD; Lcx: nl, RCA nl.    . 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[place])   . Pneumonia 1988  . Sleep apnea    "cleared after T&A"  . Type II diabetes mellitus ([place])      Current Outpatient Prescriptions on File Prior to Encounter  Medication Sig Dispense Refill  . atorvastatin (LIPITOR) 20 MG tablet Take 20 mg by mouth daily.  0  . carvedilol (COREG) 3.125 MG tablet TAKE 1 TABLET BY MOUTH 2 TIMES DAILY WITH A MEAL. 60 tablet 1  . digoxin (DIGOX) 0.25 MG tablet Take 0.5 tablets (125 mcg total) by mouth daily. 15 tablet 1  . furosemide (LASIX) 40 MG tablet TAKE 1 TABLET BY MOUTH EVERY DAY 30 tablet 3  . glucose blood (ONETOUCH VERIO) test strip Use as instructed 100 each 12  . Insulin Glargine (LANTUS SOLOSTAR) 100 UNIT/ML Solostar Pen Inject 10  Units into the skin daily at 10 pm.     . QC PEN NEEDLES 31G X 6 MM MISC 1 each by Other route See admin instructions. Reported on 12/18/2015    . sacubitril-valsartan (ENTRESTO) 24-26 MG Take 1 tablet by mouth 2 (two) times daily. 60 tablet 6  . tadalafil (CIALIS) 20 MG tablet Take 1 tablet (20 mg total) by mouth daily as needed for erectile dysfunction. 10 tablet 0  . warfarin (COUMADIN) 7.5 MG tablet Take as directed by coumadin clinic 35 tablet 1   No current facility-administered medications on file prior to encounter.       No Known Allergies   Social History   Social History  . Marital status: Single    Spouse name: N/A  . Number of children: 1  . Years of  education: 69   Occupational History  . Disabled    Social History Main Topics  . Smoking status: Current Some Day Smoker    Packs/day: 0.25    Years: 31.00    Types: Cigarettes  . Smokeless tobacco: Never Used  . Alcohol use 0.0 oz/week     Comment: 09/21/2014 "might have a drink q 6 /months, if that"  . Drug use:     Types: Cocaine     Comment: 09/21/2014 "last cocaine ~ 1 1/2 months ago"  . Sexual activity: Yes   Other Topics Concern  . Not on file   Social History Narrative   Lives in [place] with his mother. No pets. Fun: movies, sports, daughter.    Denies religious beliefs that would effect health care.               Family History  Problem Relation Age of Onset  . Diabetes Mother   . Heart disease Mother   . Diabetes Father   . Prostate cancer Father   . Heart disease Father   . Diabetes Brother   . Diabetes Brother      Vitals:   10/16/16 1427  BP: 110/80  Pulse: 82  SpO2: 93%  Weight: 167 lb (75.8 kg)   Wt Readings from Last 3 Encounters:  10/16/16 167 lb (75.8 kg)  09/08/16 165 lb (74.8 kg)  08/25/16 168 lb 3.2 oz (76.3 kg)       PHYSICAL EXAM: General:  Well appearing. NAD. Ambulated in the clinic without difficulty.  HEENT: normal Neck: supple. JVD 5-6 Carotids 2+ bilat; no bruits. No thyromegaly or nodule noted.   Cor: PMI laterally displaced. RRR. No murmurs, gallops, or rubs.  Lungs: CTAB, normal effort Abdomen: soft, NT, ND, no HSM. No bruits or masses. +BS  Extremities: no cyanosis, clubbing, rash. No peripheral edema.  Neuro: alert & oriented x 3, cranial nerves grossly intact. moves all 4 extremities w/o difficulty. Affect pleasant.   ASSESSMENT & PLAN: 1. Chronic Systolic Heart Failure- 7124 ECHO EF 10-15% with non compaction s/p ICD - St Jude.- Followed by Dr Lovena Le.  - CPX 02/07/16 with Moderate HF limitation/restrictive lung physiology. Set up CPX next visit.   - Echo 07/11/16 with EF slightly improved to 25-30% range. -  NYHA II symptoms. Continue lasix 40 mg daily. Can take extra as needed. - Continue coreg 3.125 mg BID.  - Continue digoxin 0.25 mcg, dig level < 0.2  11/2015.  - Continue entresto 24/26 mg to BID.  - add 12.5 mg spironolactone daily.  - Stressed importance of daily weights and limiting fluid to <2000 ml daily (64 oz). 2. Hx of  embolic CVA - INR per coumadin clinic . No bleeding problems.   3. CAD Coronary angiography (12/2008) with mid LAD totally occluded and collateralized. - Continue atorvastatin 20 mg daily.  - No ASA as he has no symptoms and is on coumadin for LV  noncompaction.  4. DMII - Followed by PCP. 5. Polysubstance abuse  Former, quit 09/2015 6. Tobacco Abuse - Ready to quit. Today he will start nicoderm patches with taper over the next 3 months.    Follow up in 10 day for BMET the 6-8 weeks for a visit. Consider CPX at thime.  Amy Clegg NP-C 10/16/16

## 2016-10-17 ENCOUNTER — Encounter: Payer: Self-pay | Admitting: Cardiology

## 2016-10-17 NOTE — Progress Notes (Signed)
Remote ICD transmission.   

## 2016-10-20 ENCOUNTER — Ambulatory Visit (INDEPENDENT_AMBULATORY_CARE_PROVIDER_SITE_OTHER): Payer: Medicare Other | Admitting: *Deleted

## 2016-10-20 DIAGNOSIS — I428 Other cardiomyopathies: Secondary | ICD-10-CM | POA: Diagnosis not present

## 2016-10-20 DIAGNOSIS — Z7901 Long term (current) use of anticoagulants: Secondary | ICD-10-CM

## 2016-10-20 LAB — POCT INR: INR: 3.5

## 2016-10-27 ENCOUNTER — Ambulatory Visit (HOSPITAL_COMMUNITY)
Admission: RE | Admit: 2016-10-27 | Discharge: 2016-10-27 | Disposition: A | Discharge status: Home / Self Care | Payer: Medicare Other | Source: Ambulatory Visit | Attending: Cardiology | Admitting: Cardiology

## 2016-10-27 DIAGNOSIS — I509 Heart failure, unspecified: Secondary | ICD-10-CM | POA: Insufficient documentation

## 2016-10-27 LAB — BASIC METABOLIC PANEL
Anion gap: 9 (ref 5–15)
BUN: 13 mg/dL (ref 6–20)
CO2: 29 mmol/L (ref 22–32)
Calcium: 9.4 mg/dL (ref 8.9–10.3)
Chloride: 101 mmol/L (ref 101–111)
Creatinine, Ser: 1.09 mg/dL (ref 0.61–1.24)
GFR calc Af Amer: >60 mL/min (ref >60)
GFR calc non Af Amer: >60 mL/min (ref >60)
Glucose, Bld: 190 mg/dL — ABNORMAL HIGH (ref 65–99)
Potassium: 4.3 mmol/L (ref 3.5–5.1)
Sodium: 139 mmol/L (ref 135–145)

## 2016-11-04 LAB — CUP PACEART REMOTE DEVICE CHECK
Battery Remaining Longevity: 43 mo
Battery Remaining Percentage: 39 %
Battery Voltage: 2.9 V
Brady Statistic AP VP Percent: 1 %
Brady Statistic AP VS Percent: 1 %
Brady Statistic AS VP Percent: 1 %
Brady Statistic AS VS Percent: 99 %
Brady Statistic RA Percent Paced: 1 %
Brady Statistic RV Percent Paced: 1 %
Date Time Interrogation Session: 20180104193745
HighPow Impedance: 64 Ohm
HighPow Impedance: 64 Ohm
Implantable Lead Implant Date: 20110513
Implantable Lead Implant Date: 20110513
Implantable Lead Location: 753859
Implantable Lead Location: 753860
Implantable Pulse Generator Implant Date: 20110513
Lead Channel Impedance Value: 310 Ohm
Lead Channel Impedance Value: 390 Ohm
Lead Channel Pacing Threshold Amplitude: 0.5 V
Lead Channel Pacing Threshold Amplitude: 0.75 V
Lead Channel Pacing Threshold Pulse Width: 0.5 ms
Lead Channel Pacing Threshold Pulse Width: 0.5 ms
Lead Channel Sensing Intrinsic Amplitude: 12 mV
Lead Channel Sensing Intrinsic Amplitude: 5 mV
Lead Channel Setting Pacing Amplitude: 2 V
Lead Channel Setting Pacing Amplitude: 2.5 V
Lead Channel Setting Pacing Pulse Width: 0.5 ms
Lead Channel Setting Sensing Sensitivity: 0.5 mV
Pulse Gen Serial Number: 608100

## 2016-11-25 ENCOUNTER — Encounter (INDEPENDENT_AMBULATORY_CARE_PROVIDER_SITE_OTHER): Payer: Self-pay

## 2016-11-25 ENCOUNTER — Other Ambulatory Visit (HOSPITAL_COMMUNITY): Payer: Self-pay | Admitting: Internal Medicine

## 2016-11-25 ENCOUNTER — Ambulatory Visit (INDEPENDENT_AMBULATORY_CARE_PROVIDER_SITE_OTHER): Payer: Medicare Other | Admitting: *Deleted

## 2016-11-25 ENCOUNTER — Inpatient Hospital Stay (HOSPITAL_COMMUNITY): Admission: RE | Admit: 2016-11-25 | Payer: Medicare Other | Source: Ambulatory Visit

## 2016-11-25 DIAGNOSIS — I428 Other cardiomyopathies: Secondary | ICD-10-CM | POA: Diagnosis not present

## 2016-11-25 DIAGNOSIS — Z7901 Long term (current) use of anticoagulants: Secondary | ICD-10-CM

## 2016-11-25 LAB — POCT INR: INR: 2

## 2016-11-26 ENCOUNTER — Other Ambulatory Visit (HOSPITAL_COMMUNITY): Payer: Self-pay | Admitting: Internal Medicine

## 2016-11-27 ENCOUNTER — Encounter (HOSPITAL_COMMUNITY): Payer: Medicare Other

## 2016-12-02 ENCOUNTER — Ambulatory Visit (HOSPITAL_COMMUNITY)
Admission: RE | Admit: 2016-12-02 | Discharge: 2016-12-02 | Disposition: A | Discharge status: Home / Self Care | Payer: Medicare Other | Source: Ambulatory Visit | Attending: Cardiology | Admitting: Cardiology

## 2016-12-02 ENCOUNTER — Encounter (HOSPITAL_COMMUNITY): Payer: Self-pay

## 2016-12-02 VITALS — BP 116/66 | HR 76 | Wt 164.2 lb

## 2016-12-02 DIAGNOSIS — Z79899 Other long term (current) drug therapy: Secondary | ICD-10-CM | POA: Diagnosis not present

## 2016-12-02 DIAGNOSIS — Z5189 Encounter for other specified aftercare: Secondary | ICD-10-CM | POA: Insufficient documentation

## 2016-12-02 DIAGNOSIS — Z794 Long term (current) use of insulin: Secondary | ICD-10-CM | POA: Diagnosis not present

## 2016-12-02 DIAGNOSIS — I2510 Atherosclerotic heart disease of native coronary artery without angina pectoris: Secondary | ICD-10-CM | POA: Diagnosis not present

## 2016-12-02 DIAGNOSIS — I5022 Chronic systolic (congestive) heart failure: Secondary | ICD-10-CM

## 2016-12-02 DIAGNOSIS — E119 Type 2 diabetes mellitus without complications: Secondary | ICD-10-CM | POA: Insufficient documentation

## 2016-12-02 DIAGNOSIS — Z8673 Personal history of transient ischemic attack (TIA), and cerebral infarction without residual deficits: Secondary | ICD-10-CM | POA: Insufficient documentation

## 2016-12-02 DIAGNOSIS — Z7901 Long term (current) use of anticoagulants: Secondary | ICD-10-CM | POA: Insufficient documentation

## 2016-12-02 DIAGNOSIS — Z8249 Family history of ischemic heart disease and other diseases of the circulatory system: Secondary | ICD-10-CM | POA: Insufficient documentation

## 2016-12-02 DIAGNOSIS — E785 Hyperlipidemia, unspecified: Secondary | ICD-10-CM | POA: Diagnosis not present

## 2016-12-02 DIAGNOSIS — Z833 Family history of diabetes mellitus: Secondary | ICD-10-CM | POA: Diagnosis not present

## 2016-12-02 DIAGNOSIS — F17210 Nicotine dependence, cigarettes, uncomplicated: Secondary | ICD-10-CM | POA: Insufficient documentation

## 2016-12-02 DIAGNOSIS — Z95810 Presence of automatic (implantable) cardiac defibrillator: Secondary | ICD-10-CM | POA: Insufficient documentation

## 2016-12-02 DIAGNOSIS — Z720 Tobacco use: Secondary | ICD-10-CM

## 2016-12-02 DIAGNOSIS — I255 Ischemic cardiomyopathy: Secondary | ICD-10-CM | POA: Insufficient documentation

## 2016-12-02 DIAGNOSIS — I10 Essential (primary) hypertension: Secondary | ICD-10-CM

## 2016-12-02 LAB — BASIC METABOLIC PANEL
Anion gap: 8 (ref 5–15)
BUN: 14 mg/dL (ref 6–20)
CO2: 28 mmol/L (ref 22–32)
Calcium: 9.4 mg/dL (ref 8.9–10.3)
Chloride: 102 mmol/L (ref 101–111)
Creatinine, Ser: 1 mg/dL (ref 0.61–1.24)
GFR calc Af Amer: >60 mL/min (ref >60)
GFR calc non Af Amer: >60 mL/min (ref >60)
Glucose, Bld: 286 mg/dL — ABNORMAL HIGH (ref 65–99)
Potassium: 5.4 mmol/L — ABNORMAL HIGH (ref 3.5–5.1)
Sodium: 138 mmol/L (ref 135–145)

## 2016-12-02 MED ORDER — SACUBITRIL-VALSARTAN 49-51 MG PO TABS: 1.0000 | ORAL_TABLET | Freq: Two times a day (BID) | ORAL | 3 refills | Status: DC

## 2016-12-02 NOTE — Patient Instructions (Signed)
INCREASE Entresto to 49/'51mg'$  (1 tablet) twice daily.  Routine lab work today. Will notify you of abnormal results  Repeat labs (bmet) in 10-14 days.  Your provider requests you have a Cardiopulmonary Exercise Test. (CPX)  Follow up with in 8 weeks with Jonni Sanger.
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
## 2016-12-03 ENCOUNTER — Telehealth (HOSPITAL_COMMUNITY): Payer: Self-pay | Admitting: Cardiology

## 2016-12-03 DIAGNOSIS — I509 Heart failure, unspecified: Secondary | ICD-10-CM

## 2016-12-03 MED ORDER — SACUBITRIL-VALSARTAN 24-26 MG PO TABS: 1.0000 | ORAL_TABLET | Freq: Two times a day (BID) | ORAL | 5 refills | Status: DC

## 2016-12-03 NOTE — Telephone Encounter (Signed)
Patient aware. Recheck 2/23

## 2016-12-03 NOTE — Telephone Encounter (Signed)
-----   Message from Shirley Friar, PA-C sent at 12/03/2016  8:42 AM EST ----- Please reverse change of his Dennis [name] and go back to 24/26 mg BID.     Needs to avoid high potassium foods. Make sure he is not taking any Kdur, and please recheck BMET 12/05/16.   If K remains high, will need [place].    Dennis [name] [address]" [place], PA-C 12/03/2016 8:42 AM

## 2016-12-05 ENCOUNTER — Ambulatory Visit (HOSPITAL_COMMUNITY)
Admission: RE | Admit: 2016-12-05 | Discharge: 2016-12-05 | Disposition: A | Discharge status: Home / Self Care | Payer: Medicare Other | Source: Ambulatory Visit | Attending: Cardiology | Admitting: Cardiology

## 2016-12-05 DIAGNOSIS — I509 Heart failure, unspecified: Secondary | ICD-10-CM | POA: Insufficient documentation

## 2016-12-05 LAB — BASIC METABOLIC PANEL
Anion gap: 9 (ref 5–15)
BUN: 12 mg/dL (ref 6–20)
CO2: 26 mmol/L (ref 22–32)
Calcium: 9.5 mg/dL (ref 8.9–10.3)
Chloride: 102 mmol/L (ref 101–111)
Creatinine, Ser: 1.04 mg/dL (ref 0.61–1.24)
GFR calc Af Amer: >60 mL/min (ref >60)
GFR calc non Af Amer: >60 mL/min (ref >60)
Glucose, Bld: 240 mg/dL — ABNORMAL HIGH (ref 65–99)
Potassium: 4 mmol/L (ref 3.5–5.1)
Sodium: 137 mmol/L (ref 135–145)

## 2016-12-12 ENCOUNTER — Ambulatory Visit (HOSPITAL_COMMUNITY)
Admission: RE | Admit: 2016-12-12 | Discharge: 2016-12-12 | Disposition: A | Discharge status: Home / Self Care | Payer: Medicare Other | Source: Ambulatory Visit | Attending: Cardiology | Admitting: Cardiology

## 2016-12-12 DIAGNOSIS — I5022 Chronic systolic (congestive) heart failure: Secondary | ICD-10-CM | POA: Insufficient documentation

## 2016-12-12 LAB — BASIC METABOLIC PANEL
Anion gap: 8 (ref 5–15)
BUN: 19 mg/dL (ref 6–20)
CO2: 27 mmol/L (ref 22–32)
Calcium: 9.1 mg/dL (ref 8.9–10.3)
Chloride: 103 mmol/L (ref 101–111)
Creatinine, Ser: 1.26 mg/dL — ABNORMAL HIGH (ref 0.61–1.24)
GFR calc Af Amer: >60 mL/min (ref >60)
GFR calc non Af Amer: >60 mL/min (ref >60)
Glucose, Bld: 297 mg/dL — ABNORMAL HIGH (ref 65–99)
Potassium: 4.3 mmol/L (ref 3.5–5.1)
Sodium: 138 mmol/L (ref 135–145)

## 2016-12-15 ENCOUNTER — Other Ambulatory Visit (HOSPITAL_COMMUNITY): Payer: Self-pay

## 2016-12-15 DIAGNOSIS — I5022 Chronic systolic (congestive) heart failure: Secondary | ICD-10-CM

## 2016-12-15 NOTE — Progress Notes (Signed)
Basic metabolic panel  Order: [id number]  Status:  Final result Visible to patient:  No (Not Released) Dx:  Chronic systolic heart failure ([place])  Notes Recorded by Effie Berkshire, RN on 12/15/2016 at 11:42 AM EST Patient scheduled for recheck before cpx test on 2/13 ------  Notes Recorded by Shirley Friar, PA-C on 12/12/2016 at 11:02 AM EST Creatinine up slightly, K stable.    Please repeat BMET 2-3 weeks.  Please have patient let us know if weight trends down, becomes lightheadedness or dizzy, or unine output decreases. May need to cut back on lasix with Entresto.  Thanks!   Legrand Como [address] [place], [place]

## 2016-12-16 ENCOUNTER — Ambulatory Visit (INDEPENDENT_AMBULATORY_CARE_PROVIDER_SITE_OTHER): Payer: Medicare Other | Admitting: *Deleted

## 2016-12-16 DIAGNOSIS — I428 Other cardiomyopathies: Secondary | ICD-10-CM

## 2016-12-16 DIAGNOSIS — Z7901 Long term (current) use of anticoagulants: Secondary | ICD-10-CM

## 2016-12-16 LAB — POCT INR: INR: 2.8

## 2016-12-23 ENCOUNTER — Ambulatory Visit (HOSPITAL_COMMUNITY): Payer: Medicare Other

## 2016-12-23 ENCOUNTER — Ambulatory Visit (HOSPITAL_COMMUNITY)
Admission: RE | Admit: 2016-12-23 | Discharge: 2016-12-23 | Disposition: A | Discharge status: Home / Self Care | Payer: Medicare Other | Source: Ambulatory Visit | Attending: Internal Medicine | Admitting: Internal Medicine

## 2016-12-23 DIAGNOSIS — I5022 Chronic systolic (congestive) heart failure: Secondary | ICD-10-CM | POA: Insufficient documentation

## 2016-12-23 LAB — BASIC METABOLIC PANEL
Anion gap: 11 (ref 5–15)
BUN: 10 mg/dL (ref 6–20)
CO2: 27 mmol/L (ref 22–32)
Calcium: 9.3 mg/dL (ref 8.9–10.3)
Chloride: 103 mmol/L (ref 101–111)
Creatinine, Ser: 0.97 mg/dL (ref 0.61–1.24)
GFR calc Af Amer: >60 mL/min (ref >60)
GFR calc non Af Amer: >60 mL/min (ref >60)
Glucose, Bld: 198 mg/dL — ABNORMAL HIGH (ref 65–99)
Potassium: 4 mmol/L (ref 3.5–5.1)
Sodium: 141 mmol/L (ref 135–145)

## 2016-12-24 ENCOUNTER — Other Ambulatory Visit (HOSPITAL_COMMUNITY): Payer: Self-pay | Admitting: *Deleted

## 2016-12-24 DIAGNOSIS — I5022 Chronic systolic (congestive) heart failure: Secondary | ICD-10-CM | POA: Diagnosis not present

## 2017-01-12 ENCOUNTER — Ambulatory Visit (INDEPENDENT_AMBULATORY_CARE_PROVIDER_SITE_OTHER): Payer: Medicare Other

## 2017-01-12 DIAGNOSIS — Z7901 Long term (current) use of anticoagulants: Secondary | ICD-10-CM

## 2017-01-12 DIAGNOSIS — I428 Other cardiomyopathies: Secondary | ICD-10-CM | POA: Diagnosis not present

## 2017-01-12 LAB — POCT INR: INR: 3.8

## 2017-01-21 ENCOUNTER — Other Ambulatory Visit (HOSPITAL_COMMUNITY): Payer: Self-pay | Admitting: Internal Medicine

## 2017-01-21 DIAGNOSIS — I5023 Acute on chronic systolic (congestive) heart failure: Secondary | ICD-10-CM

## 2017-01-27 ENCOUNTER — Ambulatory Visit (INDEPENDENT_AMBULATORY_CARE_PROVIDER_SITE_OTHER): Payer: Medicare Other | Admitting: *Deleted

## 2017-01-27 ENCOUNTER — Ambulatory Visit (HOSPITAL_COMMUNITY)
Admission: RE | Admit: 2017-01-27 | Discharge: 2017-01-27 | Disposition: A | Discharge status: Home / Self Care | Payer: Medicare Other | Source: Ambulatory Visit | Attending: Internal Medicine | Admitting: Internal Medicine

## 2017-01-27 ENCOUNTER — Encounter (HOSPITAL_COMMUNITY): Payer: Self-pay

## 2017-01-27 VITALS — BP 112/76 | HR 103 | Wt 171.2 lb

## 2017-01-27 DIAGNOSIS — I5023 Acute on chronic systolic (congestive) heart failure: Secondary | ICD-10-CM | POA: Diagnosis not present

## 2017-01-27 DIAGNOSIS — I2510 Atherosclerotic heart disease of native coronary artery without angina pectoris: Secondary | ICD-10-CM | POA: Diagnosis not present

## 2017-01-27 DIAGNOSIS — F17210 Nicotine dependence, cigarettes, uncomplicated: Secondary | ICD-10-CM | POA: Diagnosis not present

## 2017-01-27 DIAGNOSIS — Z95810 Presence of automatic (implantable) cardiac defibrillator: Secondary | ICD-10-CM | POA: Insufficient documentation

## 2017-01-27 DIAGNOSIS — E785 Hyperlipidemia, unspecified: Secondary | ICD-10-CM

## 2017-01-27 DIAGNOSIS — Z7901 Long term (current) use of anticoagulants: Secondary | ICD-10-CM | POA: Diagnosis not present

## 2017-01-27 DIAGNOSIS — I5022 Chronic systolic (congestive) heart failure: Secondary | ICD-10-CM | POA: Insufficient documentation

## 2017-01-27 DIAGNOSIS — Z794 Long term (current) use of insulin: Secondary | ICD-10-CM | POA: Insufficient documentation

## 2017-01-27 DIAGNOSIS — E119 Type 2 diabetes mellitus without complications: Secondary | ICD-10-CM | POA: Insufficient documentation

## 2017-01-27 DIAGNOSIS — I428 Other cardiomyopathies: Secondary | ICD-10-CM

## 2017-01-27 DIAGNOSIS — Z8249 Family history of ischemic heart disease and other diseases of the circulatory system: Secondary | ICD-10-CM | POA: Diagnosis not present

## 2017-01-27 DIAGNOSIS — Z8673 Personal history of transient ischemic attack (TIA), and cerebral infarction without residual deficits: Secondary | ICD-10-CM | POA: Diagnosis not present

## 2017-01-27 DIAGNOSIS — I10 Essential (primary) hypertension: Secondary | ICD-10-CM

## 2017-01-27 DIAGNOSIS — Z79899 Other long term (current) drug therapy: Secondary | ICD-10-CM | POA: Insufficient documentation

## 2017-01-27 DIAGNOSIS — Z833 Family history of diabetes mellitus: Secondary | ICD-10-CM | POA: Insufficient documentation

## 2017-01-27 DIAGNOSIS — I255 Ischemic cardiomyopathy: Secondary | ICD-10-CM

## 2017-01-27 DIAGNOSIS — Z720 Tobacco use: Secondary | ICD-10-CM | POA: Diagnosis not present

## 2017-01-27 MED ORDER — CARVEDILOL 6.25 MG PO TABS: 6.2500 mg | ORAL_TABLET | Freq: Two times a day (BID) | ORAL | 3 refills | Status: DC

## 2017-01-27 NOTE — Progress Notes (Signed)
Patient ID: Dennis [name], male   DOB: 1961-08-12, 56 y.o.   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 CT showed R MCA stroke. Did not receive TPA due to delay in arrival. Had ECHO that showed EF 10-15% mild RV HK. No obvious LV clot. As he improved he was transferred to CIR but then developed hypotension and transferred back to HF ward. Underwent RHC on 4/27. Filling pressures mildly elevated CO ok. Diuresed gently. Meds limited by SBP in 80s. Could only tolerate ivabradine 7.5 bid, digoxin and spiro 12.5. Lasix and carvedilol stopped.   He presents today for regular follow up. At last visit Entresto increased. Feeling good overall. Weight up 7 lbs from last visit ( 2 months ago) Eating a lot, has great appetite. SOB with stairs, but otherwise denies DOE. Continues to smoke up to 1 ppd.  No ETOH and no drugs. (Will be 18 months Sober 03/17/17). Denies lightheadedness, dizziness, palpitations, no CP. Hasn't needed any extra lasix.   ICD interrogation: No shocks, No VT/VF, No AF. Thoracic impedence above threshold.   Echo (9/17): EF 25-30%, moderate LV dilation with prominent trabeculation, normal RV size and systolic function.   Labs (7/17): K 3.7, creatinine 1.19, BNP 106  RHC 02/07/15 RA = 3 RV = 50/4/7 PA = 55/14 (34) PCW = 22 Fick cardiac output/index = 4.6/2.4 Thermodilution CO/CI = 4.8/2.5 PVR = 2.5 WU SVR = 1443 FA sat = 96% PA sat = 51%, 56%, 61%  Noninvasive Ao pressure = 106/74 (86)   ECHO 01/2015: EF 10-15%.   CPX 02/07/2016 Peak VO2: 18.7 (54% predicted peak VO2) VE/VCO2 slope: 30 OUES: 1.62 Peak RER:  1.13 Ventilatory Threshold: 10.9 (32%  predicted or measured peak VO2)  CPX 12/24/16 Peak VO2 19.6 (58% predicted peak VO2) OUES: 1.79 Peak RER 1.09 Ventilatory threshold: 14.9 (44% predicted or measure peak VO2)   Past Medical History:  Diagnosis Date  . Automatic implantable cardioverter-defibrillator in situ   . CHF (congestive heart failure) ([place])   . Chronic systolic heart failure (HCC)    a) Mixed ICM/NICM b) RHC (05/2014): RA 2, RV 19/2/3, PA 22/14 (18), PCWP 6, Fick CO/CI: 5.2 / 2.7, PVR 2.3 WU, PA 60% and 64% c) ECHO (05/2014): EF 20-25%, diff HK, akinesis entireanteroseptal myocardium, triv AI, mod MR, LA mod/sev dilated  . Drug abuse and dependence ([place])   . Heart murmur   . High cholesterol   . Ischemic cardiomyopathy    a) Coronary angiography (12/2008) at [facility]: Lmain: nl, LAD mid 100% stenosis with left to left and right-to-left collaterals to the distal LAD; Lcx: nl, RCA nl.    . 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[place])   . Pneumonia 1988  . Sleep apnea    "cleared after T&A"  . Type II diabetes mellitus ([place])      Current Outpatient Prescriptions on File Prior to Encounter  Medication Sig Dispense Refill  . atorvastatin (LIPITOR) 20 MG tablet Take 20 mg by mouth daily.  0  . atorvastatin (LIPITOR) 20 MG tablet TAKE 1 TABLET BY MOUTH DAILY AT 6 PM. 30 tablet 1  . carvedilol (COREG) 3.125 MG  tablet TAKE 1 TABLET BY MOUTH 2 TIMES DAILY WITH A MEAL. 60 tablet 1  . CORLANOR 7.5 MG TABS tablet TAKE 1 TABLET BY MOUTH 2 TIMES DAILY WITH A MEAL. 60 tablet 1  . DIGOX 250 MCG tablet TAKE ONE-HALF TABLET BY MOUTH DAILY. 15 tablet 1  . digoxin (DIGOX) 0.25 MG tablet Take 0.5 tablets (125 mcg total) by mouth daily. 15 tablet 1  . furosemide (LASIX) 40 MG tablet TAKE 1 TABLET BY MOUTH EVERY DAY 30 tablet 1  . glucose blood (ONETOUCH VERIO) test strip Use as instructed 100 each 12  . Insulin Glargine (LANTUS SOLOSTAR) 100 UNIT/ML Solostar Pen Inject 10 Units into the skin daily at 10 pm.     . nicotine (NICODERM CQ - DOSED  IN MG/24 HOURS) 21 mg/24hr patch Place 1 patch (21 mg total) onto the skin daily. CALL CHF CLINIC WHEN YOU RUN OUT TO GET 14 MG DOSE PATCHES SENT. (Patient not taking: Reported on 12/02/2016) 28 patch 0  . QC PEN NEEDLES 31G X 6 MM MISC 1 each by Other route See admin instructions. Reported on 12/18/2015    . sacubitril-valsartan (ENTRESTO) 24-26 MG Take 1 tablet by mouth 2 (two) times daily. 60 tablet 5  . spironolactone (ALDACTONE) 25 MG tablet Take 0.5 tablets (12.5 mg total) by mouth daily. 45 tablet 3  . tadalafil (CIALIS) 20 MG tablet Take 1 tablet (20 mg total) by mouth daily as needed for erectile dysfunction. 10 tablet 0  . warfarin (COUMADIN) 7.5 MG tablet Take as directed by coumadin clinic 35 tablet 1   No current facility-administered medications on file prior to encounter.       No Known Allergies   Social History   Social History  . Marital status: Single    Spouse name: N/A  . Number of children: 1  . Years of education: 102   Occupational History  . Disabled    Social History Main Topics  . Smoking status: Current Some Day Smoker    Packs/day: 0.25    Years: 31.00    Types: Cigarettes  . Smokeless tobacco: Never Used  . Alcohol use 0.0 oz/week     Comment: 09/21/2014 "might have a drink q 6 /months, if that"  . Drug use: Yes    Types: Cocaine     Comment: 09/21/2014 "last cocaine ~ 1 1/2 months ago"  . Sexual activity: Yes   Other Topics Concern  . Not on file   Social History Narrative   Lives in [place] with his mother. No pets. Fun: movies, sports, daughter.    Denies religious beliefs that would effect health care.               Family History  Problem Relation Age of Onset  . Diabetes Mother   . Heart disease Mother   . Diabetes Father   . Prostate cancer Father   . Heart disease Father   . Diabetes Brother   . Diabetes Brother      Vitals:   01/27/17 1430  BP: 112/76  Pulse: (!) 103  SpO2: 96%  Weight: 171 lb 3.2 oz (77.7 kg)    Wt Readings from Last 3 Encounters:  01/27/17 171 lb 3.2 oz (77.7 kg)  12/02/16 164 lb 4 oz (74.5 kg)  10/16/16 167 lb (75.8 kg)      PHYSICAL EXAM: General:  Well appearing AA male in NAD.    HEENT: Normal. Neck: Supple. JVP 6-7 cm. Carotids 2+ bilat;  no bruits. No thyromegaly or nodule noted.  Cor: PMI lateral. RRR. No M/G/R appreciated.  Lungs: CTAB, normal effort.  Abdomen: Soft, NT, ND, no HSM. No bruits or masses. +BS.  Extremities: No cyanosis, clubbing, rash, edema   Neuro:    ASSESSMENT & PLAN: 1. Chronic Systolic Heart Failure- 5885 ECHO EF 10-15% with non compaction s/p ICD - St Jude.- Followed by Dr Lovena Le.  - CPX 02/07/16 with Moderate HF limitation/restrictive lung physiology.  - Echo 07/11/16 with EF slightly improved to 25-30% range. - NYHA II symptoms  - Volume status looks stable on exam. Continue lasix 40 mg daily with extra 40 mg as needed for weight gain.  - Increase coreg 6.25 mg BID.   - Continue digoxin 0.25 mcg, dig level < 0.2  11/2015.  - Continue entresto 49/51 mg BID.  Recent BMET stable.  - Continue 12.5 mg spironolactone daily.  - Reinforced fluid restriction to < 2 L daily, sodium restriction to less than 2000 mg daily, and the importance of daily weights.    - Schedule for CPX, if worsening cardiac reserve may be approaching need for advanced therapies.  2. Hx of embolic CVA - INR managed per coumadin clinic. - No bleeding. No change.   3. CAD Coronary angiography (12/2008) with mid LAD totally occluded and collateralized. - Continue atorvastatin 20 mg daily.  - No ASA with stable CAD and use of coumadin  for LV  noncompaction.  4. DMII - Per PCP.  5. Polysubstance abuse  - Sober since 09/17/2015. Attends NA 6 times weekly and lives with his sponsor. No change. Will be 18 months sober in 03/2017. 6. Tobacco Abuse - Continues to smoke. Encouraged to stop and try nicotine patches.  Increase coreg as above. Recent labs stable.     Shirley Friar, PA-C  01/27/17   Greater than 50% of the 25 minute visit was spent in counseling/coordination of care regarding review of ICD interrogation, disease state educations, and diet/sodium/fluid restriction. Discussed meds with on site pharm-D.

## 2017-01-27 NOTE — Patient Instructions (Signed)
INCREASE Coreg to 6.25 mg, one tab twice a day   Your physician recommends that you schedule a follow-up appointment in:  8 weeks with Oda Kilts, PA  Do the following things EVERYDAY: 1) Weigh yourself in the morning before breakfast. Write it down and keep it in a log. 2) Take your medicines as prescribed 3) Eat low salt foods-Limit salt (sodium) to 2000 mg per day.  4) Stay as active as you can everyday 5) Limit all fluids for the day to less than 2 liters \

## 2017-01-28 ENCOUNTER — Telehealth (HOSPITAL_COMMUNITY): Payer: Self-pay | Admitting: *Deleted

## 2017-01-28 NOTE — Progress Notes (Signed)
Remote ICD transmission.   

## 2017-01-28 NOTE — Telephone Encounter (Signed)
[facility] called and left message on triage line asking to verify patient's dose of Carvedilol.  Patient's dose was increased to 6.25 mg BID at his visit yesterday.  Pharmacy is aware of dose increase and no further questions at this time.

## 2017-01-29 LAB — CUP PACEART REMOTE DEVICE CHECK
Battery Remaining Longevity: 40 mo
Battery Remaining Percentage: 37 %
Battery Voltage: 2.9 V
Brady Statistic AP VP Percent: 1 %
Brady Statistic AP VS Percent: 1 %
Brady Statistic AS VP Percent: 1 %
Brady Statistic AS VS Percent: 99 %
Brady Statistic RA Percent Paced: 1 %
Brady Statistic RV Percent Paced: 1 %
Date Time Interrogation Session: 20180417183811
HighPow Impedance: 53 Ohm
HighPow Impedance: 53 Ohm
Implantable Lead Implant Date: 20110513
Implantable Lead Implant Date: 20110513
Implantable Lead Location: 753859
Implantable Lead Location: 753860
Implantable Pulse Generator Implant Date: 20110513
Lead Channel Impedance Value: 280 Ohm
Lead Channel Impedance Value: 330 Ohm
Lead Channel Pacing Threshold Amplitude: 0.5 V
Lead Channel Pacing Threshold Amplitude: 0.75 V
Lead Channel Pacing Threshold Pulse Width: 0.5 ms
Lead Channel Pacing Threshold Pulse Width: 0.5 ms
Lead Channel Sensing Intrinsic Amplitude: 12 mV
Lead Channel Sensing Intrinsic Amplitude: 5 mV
Lead Channel Setting Pacing Amplitude: 2 V
Lead Channel Setting Pacing Amplitude: 2.5 V
Lead Channel Setting Pacing Pulse Width: 0.5 ms
Lead Channel Setting Sensing Sensitivity: 0.5 mV
Pulse Gen Serial Number: 608100

## 2017-01-30 ENCOUNTER — Encounter: Payer: Self-pay | Admitting: Cardiology

## 2017-02-02 ENCOUNTER — Other Ambulatory Visit (HOSPITAL_COMMUNITY): Payer: Self-pay | Admitting: Internal Medicine

## 2017-02-02 ENCOUNTER — Ambulatory Visit (INDEPENDENT_AMBULATORY_CARE_PROVIDER_SITE_OTHER): Payer: Medicare Other | Admitting: *Deleted

## 2017-02-02 DIAGNOSIS — Z7901 Long term (current) use of anticoagulants: Secondary | ICD-10-CM | POA: Diagnosis not present

## 2017-02-02 DIAGNOSIS — I428 Other cardiomyopathies: Secondary | ICD-10-CM

## 2017-02-02 LAB — POCT INR: INR: 3.1

## 2017-02-03 ENCOUNTER — Other Ambulatory Visit: Payer: Self-pay | Admitting: Internal Medicine

## 2017-02-17 ENCOUNTER — Ambulatory Visit (INDEPENDENT_AMBULATORY_CARE_PROVIDER_SITE_OTHER): Payer: Medicare Other | Admitting: *Deleted

## 2017-02-17 DIAGNOSIS — Z7901 Long term (current) use of anticoagulants: Secondary | ICD-10-CM | POA: Diagnosis not present

## 2017-02-17 DIAGNOSIS — I428 Other cardiomyopathies: Secondary | ICD-10-CM

## 2017-02-17 LAB — POCT INR: INR: 1.1

## 2017-02-24 ENCOUNTER — Ambulatory Visit (INDEPENDENT_AMBULATORY_CARE_PROVIDER_SITE_OTHER): Payer: Medicare Other

## 2017-02-24 DIAGNOSIS — Z7901 Long term (current) use of anticoagulants: Secondary | ICD-10-CM | POA: Diagnosis not present

## 2017-02-24 DIAGNOSIS — I428 Other cardiomyopathies: Secondary | ICD-10-CM | POA: Diagnosis not present

## 2017-02-24 LAB — POCT INR: INR: 4.1

## 2017-03-10 ENCOUNTER — Other Ambulatory Visit (HOSPITAL_COMMUNITY): Payer: Self-pay | Admitting: Internal Medicine

## 2017-03-11 ENCOUNTER — Telehealth (HOSPITAL_COMMUNITY): Payer: Self-pay | Admitting: *Deleted

## 2017-03-11 NOTE — Telephone Encounter (Signed)
Pt left VM requesting return call. I called patient back no answer left VM.  

## 2017-03-19 ENCOUNTER — Ambulatory Visit (INDEPENDENT_AMBULATORY_CARE_PROVIDER_SITE_OTHER): Payer: Medicare Other | Admitting: *Deleted

## 2017-03-19 DIAGNOSIS — I428 Other cardiomyopathies: Secondary | ICD-10-CM | POA: Diagnosis not present

## 2017-03-19 DIAGNOSIS — Z7901 Long term (current) use of anticoagulants: Secondary | ICD-10-CM

## 2017-03-19 LAB — POCT INR: INR: 1.3

## 2017-03-24 ENCOUNTER — Encounter (HOSPITAL_COMMUNITY): Payer: Medicare Other

## 2017-03-25 ENCOUNTER — Ambulatory Visit (HOSPITAL_COMMUNITY)
Admission: RE | Admit: 2017-03-25 | Discharge: 2017-03-25 | Disposition: A | Discharge status: Home / Self Care | Payer: Medicare Other | Source: Ambulatory Visit | Attending: Internal Medicine | Admitting: Internal Medicine

## 2017-03-25 ENCOUNTER — Encounter (HOSPITAL_COMMUNITY): Payer: Self-pay

## 2017-03-25 VITALS — BP 104/63 | HR 82 | Wt 168.4 lb

## 2017-03-25 DIAGNOSIS — I2510 Atherosclerotic heart disease of native coronary artery without angina pectoris: Secondary | ICD-10-CM | POA: Insufficient documentation

## 2017-03-25 DIAGNOSIS — Z8042 Family history of malignant neoplasm of prostate: Secondary | ICD-10-CM | POA: Insufficient documentation

## 2017-03-25 DIAGNOSIS — Z7901 Long term (current) use of anticoagulants: Secondary | ICD-10-CM | POA: Diagnosis not present

## 2017-03-25 DIAGNOSIS — I428 Other cardiomyopathies: Secondary | ICD-10-CM | POA: Diagnosis not present

## 2017-03-25 DIAGNOSIS — Z794 Long term (current) use of insulin: Secondary | ICD-10-CM | POA: Insufficient documentation

## 2017-03-25 DIAGNOSIS — I255 Ischemic cardiomyopathy: Secondary | ICD-10-CM | POA: Diagnosis not present

## 2017-03-25 DIAGNOSIS — E119 Type 2 diabetes mellitus without complications: Secondary | ICD-10-CM | POA: Diagnosis not present

## 2017-03-25 DIAGNOSIS — F17210 Nicotine dependence, cigarettes, uncomplicated: Secondary | ICD-10-CM | POA: Insufficient documentation

## 2017-03-25 DIAGNOSIS — G4730 Sleep apnea, unspecified: Secondary | ICD-10-CM | POA: Diagnosis not present

## 2017-03-25 DIAGNOSIS — E7800 Pure hypercholesterolemia, unspecified: Secondary | ICD-10-CM | POA: Diagnosis not present

## 2017-03-25 DIAGNOSIS — F1910 Other psychoactive substance abuse, uncomplicated: Secondary | ICD-10-CM | POA: Insufficient documentation

## 2017-03-25 DIAGNOSIS — Z79899 Other long term (current) drug therapy: Secondary | ICD-10-CM | POA: Insufficient documentation

## 2017-03-25 DIAGNOSIS — Z95810 Presence of automatic (implantable) cardiac defibrillator: Secondary | ICD-10-CM | POA: Insufficient documentation

## 2017-03-25 DIAGNOSIS — Z9119 Patient's noncompliance with other medical treatment and regimen: Secondary | ICD-10-CM | POA: Diagnosis not present

## 2017-03-25 DIAGNOSIS — I639 Cerebral infarction, unspecified: Secondary | ICD-10-CM

## 2017-03-25 DIAGNOSIS — I5022 Chronic systolic (congestive) heart failure: Secondary | ICD-10-CM | POA: Diagnosis not present

## 2017-03-25 DIAGNOSIS — I2582 Chronic total occlusion of coronary artery: Secondary | ICD-10-CM | POA: Insufficient documentation

## 2017-03-25 DIAGNOSIS — Z720 Tobacco use: Secondary | ICD-10-CM | POA: Diagnosis not present

## 2017-03-25 DIAGNOSIS — Z833 Family history of diabetes mellitus: Secondary | ICD-10-CM | POA: Diagnosis not present

## 2017-03-25 DIAGNOSIS — I10 Essential (primary) hypertension: Secondary | ICD-10-CM | POA: Diagnosis not present

## 2017-03-25 DIAGNOSIS — Z8249 Family history of ischemic heart disease and other diseases of the circulatory system: Secondary | ICD-10-CM | POA: Insufficient documentation

## 2017-03-25 DIAGNOSIS — R0602 Shortness of breath: Secondary | ICD-10-CM | POA: Insufficient documentation

## 2017-03-25 DIAGNOSIS — Z8673 Personal history of transient ischemic attack (TIA), and cerebral infarction without residual deficits: Secondary | ICD-10-CM | POA: Diagnosis not present

## 2017-03-25 LAB — BASIC METABOLIC PANEL
Anion gap: 7 (ref 5–15)
BUN: 13 mg/dL (ref 6–20)
CO2: 26 mmol/L (ref 22–32)
Calcium: 8.9 mg/dL (ref 8.9–10.3)
Chloride: 102 mmol/L (ref 101–111)
Creatinine, Ser: 1.07 mg/dL (ref 0.61–1.24)
GFR calc Af Amer: >60 mL/min (ref >60)
GFR calc non Af Amer: >60 mL/min (ref >60)
Glucose, Bld: 383 mg/dL — ABNORMAL HIGH (ref 65–99)
Potassium: 3.8 mmol/L (ref 3.5–5.1)
Sodium: 135 mmol/L (ref 135–145)

## 2017-03-25 LAB — CBC
HCT: 37.5 % — ABNORMAL LOW (ref 39.0–52.0)
Hemoglobin: 11.8 g/dL — ABNORMAL LOW (ref 13.0–17.0)
MCH: 30.3 pg (ref 26.0–34.0)
MCHC: 31.5 g/dL (ref 30.0–36.0)
MCV: 96.2 fL (ref 78.0–100.0)
Platelets: 203 10*3/uL (ref 150–400)
RBC: 3.9 MIL/uL — ABNORMAL LOW (ref 4.22–5.81)
RDW: 13.5 % (ref 11.5–15.5)
WBC: 9.7 10*3/uL (ref 4.0–10.5)

## 2017-03-25 LAB — BRAIN NATRIURETIC PEPTIDE: B Natriuretic Peptide: 53.8 pg/mL (ref 0.0–100.0)

## 2017-03-25 NOTE — Patient Instructions (Signed)
Labs today We will only contact you if something comes back abnormal or we need to make some changes. Otherwise no news is good news!   Your physician recommends that you schedule a follow-up appointment in: 4 months with Dr Haroldine Laws and echo  Your physician has requested that you have an echocardiogram. Echocardiography is a painless test that uses sound waves to create images of your heart. It provides your doctor with information about the size and shape of your heart and how well your heart's chambers and valves are working. This procedure takes approximately one hour. There are no restrictions for this procedure.

## 2017-03-25 NOTE — Progress Notes (Signed)
Patient ID: Dennis Dennis [name], Dennis [name]   DOB: 1961/06/27, 56 y.o.   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 CT showed R MCA stroke. Did not receive TPA due to delay in arrival. Had ECHO that showed EF 10-15% mild RV HK. No obvious LV clot. As he improved he was transferred to CIR but then developed hypotension and transferred back to HF ward. Underwent RHC on 4/27. Filling pressures mildly elevated CO ok. Diuresed gently. Meds limited by SBP in 80s. Could only tolerate ivabradine 7.5 bid, digoxin and spiro 12.5. Lasix and carvedilol stopped.   He presents today for follow up. At last visit coreg increase. He state he didn't feel any different. Weight down 3 lbs from last visit. Mild SOB on inclines and stairs. No DOE on flat ground. Continues to smoke but down to 1/2 ppd. No ETOH or drugs for 18 months last week. Denies lightheadedness, dizziness, palpitations, or CP. Deneis peripheral edema. Didn't realize his fluid was elevated in May (by corevue).   ICD interrogation: Thoracic impedence above threshold. Last fluid crossing in May. No VT/VF. AT/AF burden > 1%.    Echo (9/17): EF 25-30%, moderate LV dilation with prominent trabeculation, normal RV size and systolic function.   Labs (7/17): K 3.7, creatinine 1.19, BNP 106  RHC 02/07/15 RA = 3 RV = 50/4/7 PA = 55/14 (34) PCW = 22 Fick cardiac output/index = 4.6/2.4 Thermodilution CO/CI = 4.8/2.5 PVR = 2.5 WU SVR = 1443 FA sat = 96% PA sat = 51%, 56%, 61%  Noninvasive Ao pressure = 106/74 (86)   ECHO 01/2015: EF 10-15%.   CPX 02/07/2016 Peak VO2: 18.7 (54% predicted peak VO2) VE/VCO2 slope: 30 OUES: 1.62 Peak  RER:  1.13 Ventilatory Threshold: 10.9 (32% predicted or measured peak VO2)  CPX 12/24/16 Peak VO2 19.6 (58% predicted peak VO2) VE/VCO2 slope: 31 OUES: 1.79 Peak RER 1.09 Ventilatory threshold: 14.9 (44% predicted or measure peak VO2)   Past Medical History:  Diagnosis Date  . Automatic implantable cardioverter-defibrillator in situ   . CHF (congestive heart failure) ([place])   . Chronic systolic heart failure (HCC)    a) Mixed ICM/NICM b) RHC (05/2014): RA 2, RV 19/2/3, PA 22/14 (18), PCWP 6, Fick CO/CI: 5.2 / 2.7, PVR 2.3 WU, PA 60% and 64% c) ECHO (05/2014): EF 20-25%, diff HK, akinesis entireanteroseptal myocardium, triv AI, mod MR, LA mod/sev dilated  . Drug abuse and dependence ([place])   . Heart murmur   . High cholesterol   . Ischemic cardiomyopathy    a) Coronary angiography (12/2008) at [facility]: Lmain: nl, LAD mid 100% stenosis with left to left and right-to-left collaterals to the distal LAD; Lcx: nl, RCA nl.    . 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[place])   . Pneumonia 1988  . Sleep apnea    "cleared after T&A"  . Type II diabetes mellitus ([place])      Current Outpatient Prescriptions on File Prior to Encounter  Medication Sig Dispense Refill  . atorvastatin (LIPITOR) 20 MG tablet TAKE 1 TABLET BY MOUTH DAILY AT 6 PM. 30 tablet 6  . carvedilol (COREG) 6.25 MG tablet Take 1  tablet (6.25 mg total) by mouth 2 (two) times daily with a meal. 60 tablet 3  . CORLANOR 7.5 MG TABS tablet TAKE 1 TABLET BY MOUTH 2 TIMES DAILY WITH A MEAL. 60 tablet 1  . digoxin (DIGOX) 0.25 MG tablet Take 0.5 tablets (125 mcg total) by mouth daily. 15 tablet 1  . furosemide (LASIX) 40 MG tablet TAKE 1 TABLET BY MOUTH EVERY DAY 40 tablet 6  . glucose blood (ONETOUCH VERIO) test strip Use as instructed 100 each 12  . Insulin Glargine (LANTUS SOLOSTAR) 100 UNIT/ML Solostar Pen Inject 10 Units into the skin daily at 10 pm.     . nicotine (NICODERM CQ - DOSED IN MG/24 HOURS) 21 mg/24hr patch Place 1 patch (21 mg  total) onto the skin daily. CALL CHF CLINIC WHEN YOU RUN OUT TO GET 14 MG DOSE PATCHES SENT. 28 patch 0  . QC PEN NEEDLES 31G X 6 MM MISC 1 each by Other route See admin instructions. Reported on 12/18/2015    . sacubitril-valsartan (ENTRESTO) 24-26 MG Take 1 tablet by mouth 2 (two) times daily. 60 tablet 5  . spironolactone (ALDACTONE) 25 MG tablet Take 0.5 tablets (12.5 mg total) by mouth daily. 45 tablet 3  . tadalafil (CIALIS) 20 MG tablet Take 1 tablet (20 mg total) by mouth daily as needed for erectile dysfunction. 10 tablet 0  . warfarin (COUMADIN) 7.5 MG tablet TAKE BY MOUTH AS DIRECTED BY COUMADIN CLINIC 35 tablet 3   No current facility-administered medications on file prior to encounter.       No Known Allergies   Social History   Social History  . Marital status: Single    Spouse name: N/A  . Number of children: 1  . Years of education: 48   Occupational History  . Disabled    Social History Main Topics  . Smoking status: Current Some Day Smoker    Packs/day: 0.25    Years: 31.00    Types: Cigarettes  . Smokeless tobacco: Never Used  . Alcohol use 0.0 oz/week     Comment: 09/21/2014 "might have a drink q 6 /months, if that"  . Drug use: Yes    Types: Cocaine     Comment: 09/21/2014 "last cocaine ~ 1 1/2 months ago"  . Sexual activity: Yes   Other Topics Concern  . Not on file   Social History Narrative   Lives in [place] with his mother. No pets. Fun: movies, sports, daughter.    Denies religious beliefs that would effect health care.               Family History  Problem Relation Age of Onset  . Diabetes Mother   . Heart disease Mother   . Diabetes Father   . Prostate cancer Father   . Heart disease Father   . Diabetes Brother   . Diabetes Brother      Vitals:   03/25/17 1331  BP: 104/63  Pulse: 82  SpO2: 95%  Weight: 168 lb 6 oz (76.4 kg)   Wt Readings from Last 3 Encounters:  03/25/17 168 lb 6 oz (76.4 kg)  01/27/17 171 lb 3.2 oz  (77.7 kg)  12/02/16 164 lb 4 oz (74.5 kg)      PHYSICAL EXAM: General:  Well appearing AA Dennis [name] in NAD.    HEENT: Normal. Neck: Supple. JVP 6-7 cm. Carotids 2+ bilat; no bruits. No thyromegaly or nodule noted.  Cor: PMI lateral. RRR. No M/G/R appreciated.  Lungs:  CTAB, normal effort.  Abdomen: Soft, NT, ND, no HSM. No bruits or masses. +BS.  Extremities: No cyanosis, clubbing, rash, edema   Neuro:    ASSESSMENT & PLAN: 1. Chronic Systolic Heart Failure- 5093 ECHO EF 10-15% with non compaction s/p ICD - St Jude.- Followed by Dr Lovena Le.  - CPX 12/2016 with Moderate HF limitation/restrictive lung physiology. Stable from previous. - Echo 07/11/16 with EF slightly improved to 25-30% range. - NYHA II symptoms.  - Volume status stable on exam. Continue lasix 40 mg with extra 40 mg as needed.  - Continue coreg 6.25 mg BID.   - Continue digoxin 0.25 mcg, dig level < 0.2  11/2015.  - Continue entresto 49/51 mg BID.  BMET/BNP today.   - Continue 12.5 mg spironolactone daily.  - Reinforced fluid restriction to < 2 L daily, sodium restriction to less than 2000 mg daily, and the importance of daily weights.   2. Hx of embolic CVA - INR managed per coumadin clinic. CBC today.  - No bleeding. No change.    3. CAD Coronary angiography (12/2008) with mid LAD totally occluded and collateralized. - Continue atorvastatin 20 mg daily.  - No ASA with stable CAD and use of coumadin  for LV  noncompaction. No change.  4. DMII - Per PCP.  5. Polysubstance abuse  - Sober since 09/17/2015. Attends NA 6 times weekly and lives with his sponsor.  - Pt is 18 months sober this month. Congratulated.  6. Tobacco Abuse - Encouraged to stop completely.   BP relatively soft despite having not taken any medications this am. Denies lightheadedness or dizziness when taking meds. Will continue current regimen for now. Labs today and RTC 4 months with Echo.   Shirley Friar, PA-C  03/25/17

## 2017-03-26 ENCOUNTER — Ambulatory Visit (INDEPENDENT_AMBULATORY_CARE_PROVIDER_SITE_OTHER): Payer: Medicare Other | Admitting: Pharmacist

## 2017-03-26 DIAGNOSIS — Z7901 Long term (current) use of anticoagulants: Secondary | ICD-10-CM

## 2017-03-26 DIAGNOSIS — I428 Other cardiomyopathies: Secondary | ICD-10-CM | POA: Diagnosis not present

## 2017-03-26 LAB — POCT INR: INR: 2.1

## 2017-04-16 ENCOUNTER — Ambulatory Visit (INDEPENDENT_AMBULATORY_CARE_PROVIDER_SITE_OTHER): Payer: Medicare Other | Admitting: *Deleted

## 2017-04-16 DIAGNOSIS — I428 Other cardiomyopathies: Secondary | ICD-10-CM

## 2017-04-16 DIAGNOSIS — Z7901 Long term (current) use of anticoagulants: Secondary | ICD-10-CM | POA: Diagnosis not present

## 2017-04-16 LAB — POCT INR: INR: 1.9

## 2017-04-20 ENCOUNTER — Other Ambulatory Visit: Payer: Self-pay | Admitting: Internal Medicine

## 2017-04-27 ENCOUNTER — Other Ambulatory Visit (HOSPITAL_COMMUNITY): Payer: Self-pay | Admitting: Internal Medicine

## 2017-04-28 ENCOUNTER — Encounter: Payer: Medicare Other | Admitting: *Deleted

## 2017-04-28 ENCOUNTER — Telehealth: Payer: Self-pay | Admitting: Cardiology

## 2017-04-28 NOTE — Telephone Encounter (Signed)
LMOVM reminding pt to send remote transmission.   

## 2017-04-30 ENCOUNTER — Encounter: Payer: Self-pay | Admitting: Cardiology

## 2017-05-12 ENCOUNTER — Ambulatory Visit (INDEPENDENT_AMBULATORY_CARE_PROVIDER_SITE_OTHER): Payer: Medicare Other | Admitting: Pharmacist

## 2017-05-12 DIAGNOSIS — I428 Other cardiomyopathies: Secondary | ICD-10-CM | POA: Diagnosis not present

## 2017-05-12 DIAGNOSIS — Z7901 Long term (current) use of anticoagulants: Secondary | ICD-10-CM

## 2017-05-12 LAB — POCT INR: INR: 4

## 2017-05-27 ENCOUNTER — Ambulatory Visit (INDEPENDENT_AMBULATORY_CARE_PROVIDER_SITE_OTHER): Payer: Medicare Other | Admitting: *Deleted

## 2017-05-27 DIAGNOSIS — I428 Other cardiomyopathies: Secondary | ICD-10-CM

## 2017-05-27 DIAGNOSIS — Z7901 Long term (current) use of anticoagulants: Secondary | ICD-10-CM

## 2017-05-27 LAB — POCT INR: INR: 2.7

## 2017-06-08 ENCOUNTER — Encounter (HOSPITAL_COMMUNITY): Payer: Self-pay | Admitting: Emergency Medicine

## 2017-06-08 ENCOUNTER — Emergency Department (HOSPITAL_COMMUNITY): Payer: Medicare Other

## 2017-06-08 DIAGNOSIS — R51 Headache: Secondary | ICD-10-CM | POA: Insufficient documentation

## 2017-06-08 DIAGNOSIS — Z5321 Procedure and treatment not carried out due to patient leaving prior to being seen by health care provider: Secondary | ICD-10-CM | POA: Insufficient documentation

## 2017-06-08 DIAGNOSIS — R0602 Shortness of breath: Secondary | ICD-10-CM | POA: Diagnosis present

## 2017-06-08 DIAGNOSIS — R42 Dizziness and giddiness: Secondary | ICD-10-CM | POA: Insufficient documentation

## 2017-06-08 DIAGNOSIS — R110 Nausea: Secondary | ICD-10-CM | POA: Diagnosis not present

## 2017-06-08 DIAGNOSIS — I959 Hypotension, unspecified: Secondary | ICD-10-CM | POA: Insufficient documentation

## 2017-06-08 LAB — CBC
HCT: 39.3 % (ref 39.0–52.0)
Hemoglobin: 12.8 g/dL — ABNORMAL LOW (ref 13.0–17.0)
MCH: 30.5 pg (ref 26.0–34.0)
MCHC: 32.6 g/dL (ref 30.0–36.0)
MCV: 93.6 fL (ref 78.0–100.0)
Platelets: 222 10*3/uL (ref 150–400)
RBC: 4.2 MIL/uL — ABNORMAL LOW (ref 4.22–5.81)
RDW: 13.2 % (ref 11.5–15.5)
WBC: 11.5 10*3/uL — ABNORMAL HIGH (ref 4.0–10.5)

## 2017-06-08 LAB — BASIC METABOLIC PANEL
Anion gap: 9 (ref 5–15)
BUN: 17 mg/dL (ref 6–20)
CO2: 28 mmol/L (ref 22–32)
Calcium: 9.5 mg/dL (ref 8.9–10.3)
Chloride: 101 mmol/L (ref 101–111)
Creatinine, Ser: 1.75 mg/dL — ABNORMAL HIGH (ref 0.61–1.24)
GFR calc Af Amer: 49 mL/min — ABNORMAL LOW (ref >60)
GFR calc non Af Amer: 42 mL/min — ABNORMAL LOW (ref >60)
Glucose, Bld: 170 mg/dL — ABNORMAL HIGH (ref 65–99)
Potassium: 4.5 mmol/L (ref 3.5–5.1)
Sodium: 138 mmol/L (ref 135–145)

## 2017-06-08 LAB — I-STAT TROPONIN, ED: Troponin i, poc: 0 ng/mL (ref 0.00–0.08)

## 2017-06-08 IMAGING — CR DG CHEST 2V
2 series · 2 of 2 positions shown · non-contrast
Comparison: [DATE]

CLINICAL DATA: Lightheadedness and near syncope tonight.

EXAM:
CHEST  2 VIEW

[chest pa]
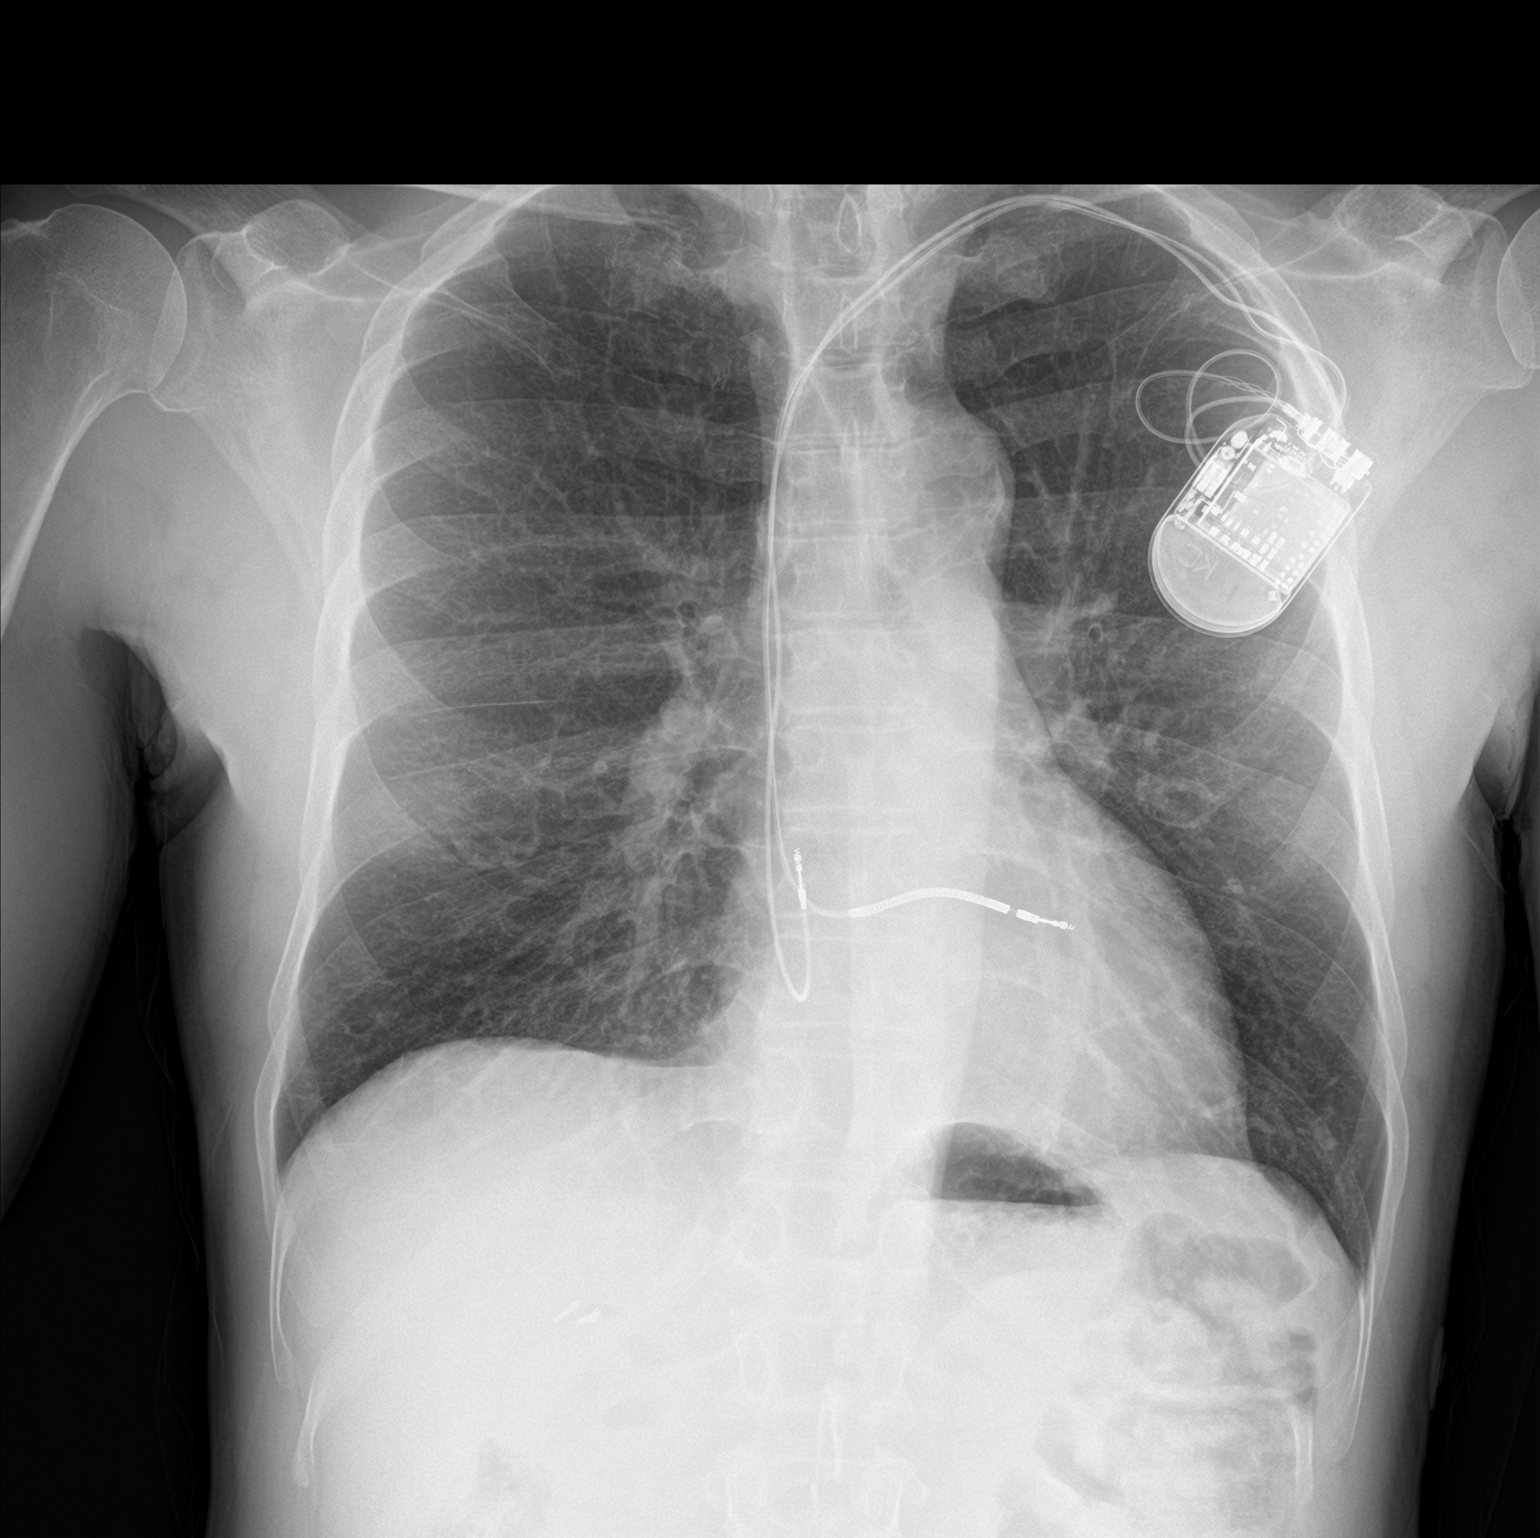

[chest lat]
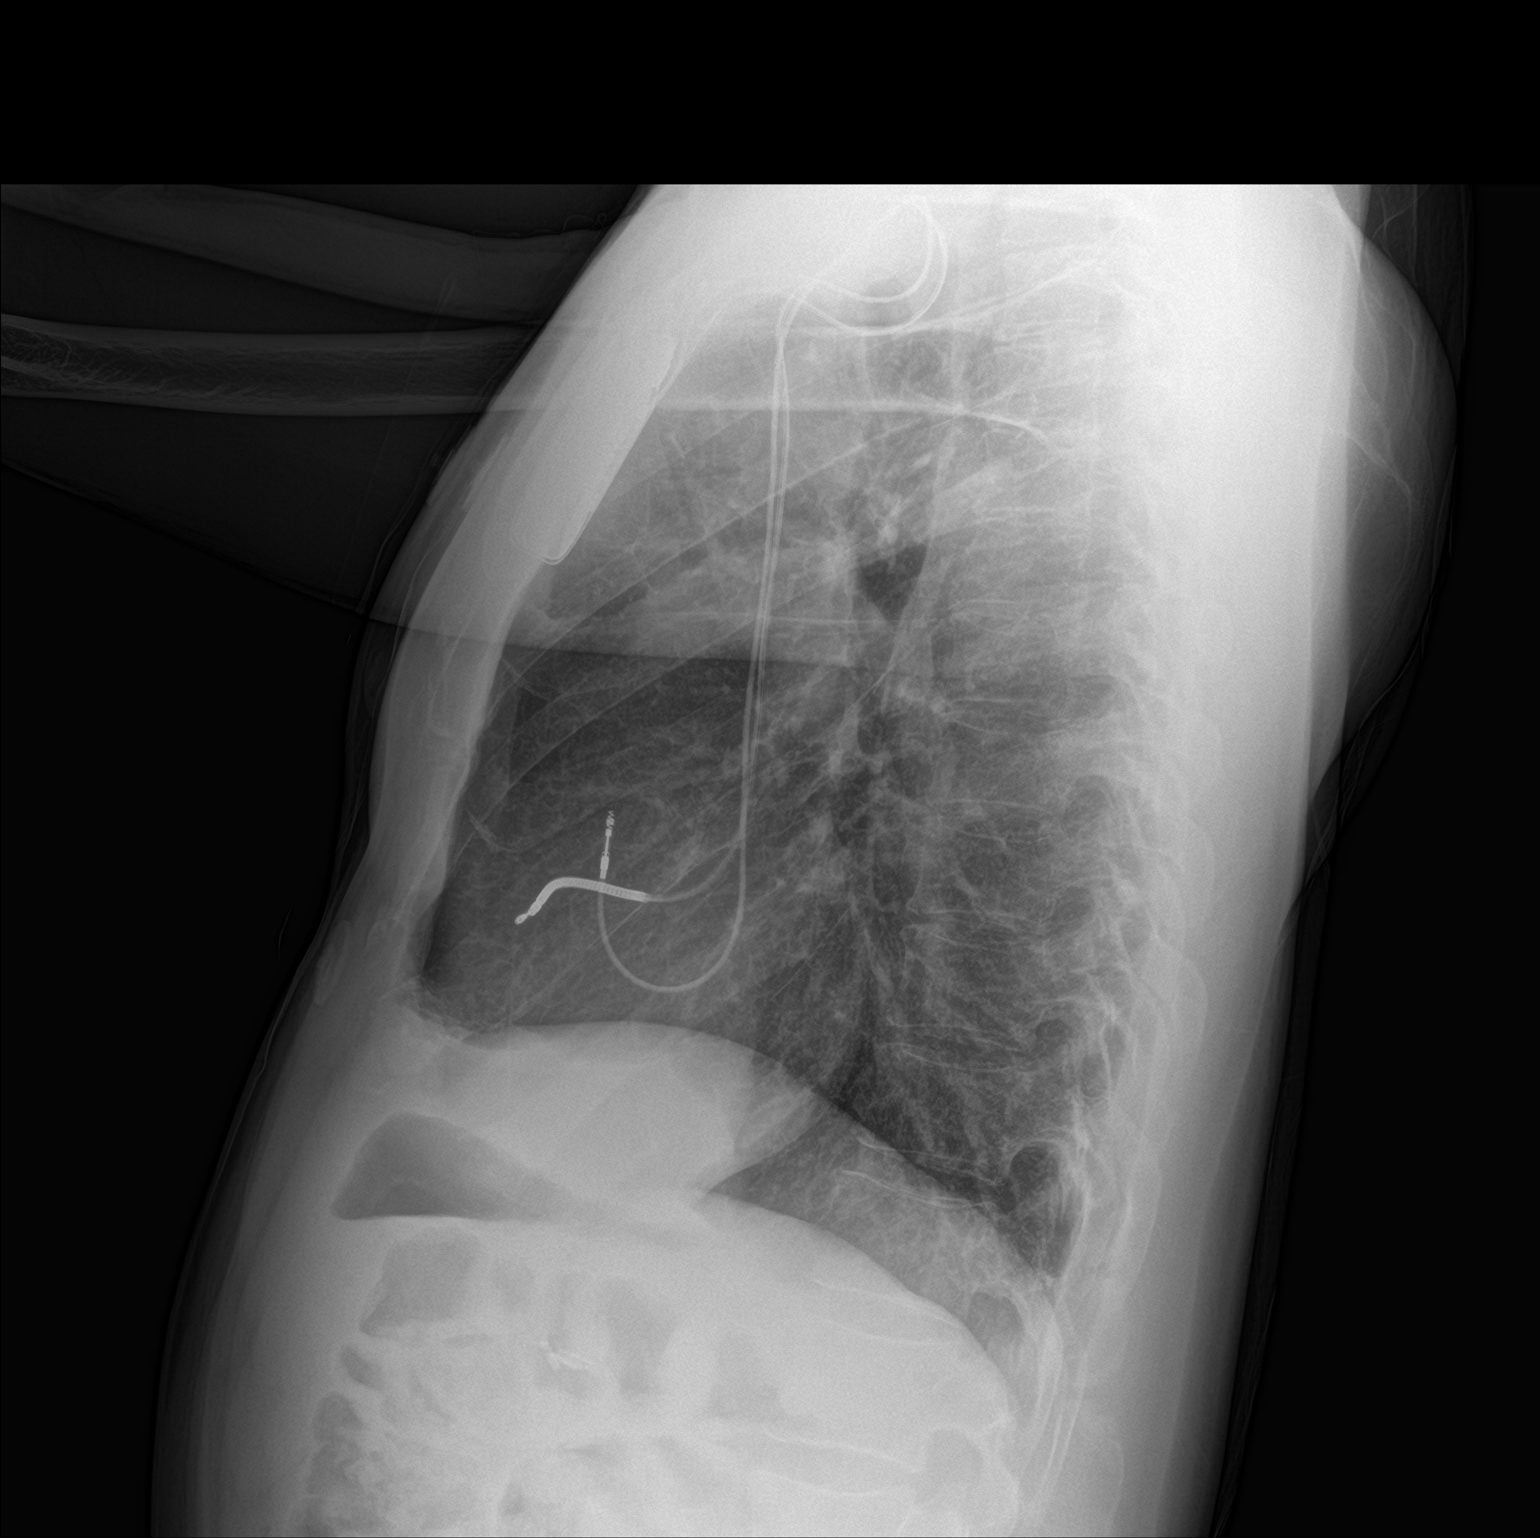

[2 of 2 positions shown; findings below may reference images not displayed]

FINDINGS: Unchanged mild hyperinflation. The lungs are clear. Normal heart
size. Grossly intact dual-lumen transvenous cardiac leads. Hilar,
mediastinal and cardiac contours are unremarkable and unchanged.

Pulmonary vasculature is normal.

No pleural effusions.
IMPRESSION: Unchanged mild hyperinflation. No consolidation. Normal vasculature.

## 2017-06-08 NOTE — ED Triage Notes (Addendum)
Patient presents with multiple complaints : SOB , nausea , dizziness , headache onset this evening  and hypotensive at triage , denies chest pain , no cough or congestion , denies fever or chills .

## 2017-06-09 ENCOUNTER — Emergency Department (HOSPITAL_COMMUNITY)
Admission: EM | Admit: 2017-06-09 | Discharge: 2017-06-09 | Disposition: A | Discharge status: Home / Self Care | Payer: Medicare Other | Attending: Emergency Medicine | Admitting: Emergency Medicine

## 2017-06-17 ENCOUNTER — Ambulatory Visit (INDEPENDENT_AMBULATORY_CARE_PROVIDER_SITE_OTHER): Payer: Medicare Other | Admitting: *Deleted

## 2017-06-17 DIAGNOSIS — Z7901 Long term (current) use of anticoagulants: Secondary | ICD-10-CM

## 2017-06-17 DIAGNOSIS — I428 Other cardiomyopathies: Secondary | ICD-10-CM | POA: Diagnosis not present

## 2017-06-17 LAB — POCT INR: INR: 2.2

## 2017-06-24 ENCOUNTER — Other Ambulatory Visit (HOSPITAL_COMMUNITY): Payer: Self-pay | Admitting: Internal Medicine

## 2017-06-24 DIAGNOSIS — I5023 Acute on chronic systolic (congestive) heart failure: Secondary | ICD-10-CM

## 2017-07-13 ENCOUNTER — Other Ambulatory Visit (HOSPITAL_COMMUNITY): Payer: Self-pay | Admitting: Internal Medicine

## 2017-07-15 ENCOUNTER — Ambulatory Visit (INDEPENDENT_AMBULATORY_CARE_PROVIDER_SITE_OTHER): Payer: Medicare Other | Admitting: *Deleted

## 2017-07-15 DIAGNOSIS — Z5181 Encounter for therapeutic drug level monitoring: Secondary | ICD-10-CM | POA: Diagnosis not present

## 2017-07-15 DIAGNOSIS — Z7901 Long term (current) use of anticoagulants: Secondary | ICD-10-CM | POA: Diagnosis not present

## 2017-07-15 DIAGNOSIS — I428 Other cardiomyopathies: Secondary | ICD-10-CM

## 2017-07-15 LAB — POCT INR: INR: 2.9

## 2017-08-12 ENCOUNTER — Ambulatory Visit (HOSPITAL_COMMUNITY)
Admission: RE | Admit: 2017-08-12 | Discharge: 2017-08-12 | Disposition: A | Discharge status: Home / Self Care | Payer: Medicare Other | Source: Ambulatory Visit | Attending: Internal Medicine | Admitting: Internal Medicine

## 2017-08-12 ENCOUNTER — Encounter (HOSPITAL_COMMUNITY): Payer: Self-pay | Admitting: Internal Medicine

## 2017-08-12 ENCOUNTER — Ambulatory Visit (INDEPENDENT_AMBULATORY_CARE_PROVIDER_SITE_OTHER): Payer: Medicare Other | Admitting: *Deleted

## 2017-08-12 ENCOUNTER — Other Ambulatory Visit (HOSPITAL_COMMUNITY): Payer: Self-pay | Admitting: Internal Medicine

## 2017-08-12 ENCOUNTER — Ambulatory Visit (HOSPITAL_BASED_OUTPATIENT_CLINIC_OR_DEPARTMENT_OTHER)
Admission: RE | Admit: 2017-08-12 | Discharge: 2017-08-12 | Disposition: A | Discharge status: Home / Self Care | Payer: Medicare Other | Source: Ambulatory Visit | Attending: Internal Medicine | Admitting: Internal Medicine

## 2017-08-12 VITALS — BP 122/84 | HR 84 | Wt 168.8 lb

## 2017-08-12 DIAGNOSIS — E119 Type 2 diabetes mellitus without complications: Secondary | ICD-10-CM | POA: Insufficient documentation

## 2017-08-12 DIAGNOSIS — F1910 Other psychoactive substance abuse, uncomplicated: Secondary | ICD-10-CM | POA: Diagnosis not present

## 2017-08-12 DIAGNOSIS — Z79899 Other long term (current) drug therapy: Secondary | ICD-10-CM | POA: Insufficient documentation

## 2017-08-12 DIAGNOSIS — I63411 Cerebral infarction due to embolism of right middle cerebral artery: Secondary | ICD-10-CM | POA: Diagnosis not present

## 2017-08-12 DIAGNOSIS — E7800 Pure hypercholesterolemia, unspecified: Secondary | ICD-10-CM | POA: Insufficient documentation

## 2017-08-12 DIAGNOSIS — Z5181 Encounter for therapeutic drug level monitoring: Secondary | ICD-10-CM

## 2017-08-12 DIAGNOSIS — I428 Other cardiomyopathies: Secondary | ICD-10-CM

## 2017-08-12 DIAGNOSIS — I255 Ischemic cardiomyopathy: Secondary | ICD-10-CM | POA: Insufficient documentation

## 2017-08-12 DIAGNOSIS — Z720 Tobacco use: Secondary | ICD-10-CM | POA: Diagnosis not present

## 2017-08-12 DIAGNOSIS — I2510 Atherosclerotic heart disease of native coronary artery without angina pectoris: Secondary | ICD-10-CM | POA: Insufficient documentation

## 2017-08-12 DIAGNOSIS — I639 Cerebral infarction, unspecified: Secondary | ICD-10-CM | POA: Diagnosis not present

## 2017-08-12 DIAGNOSIS — Z7901 Long term (current) use of anticoagulants: Secondary | ICD-10-CM

## 2017-08-12 DIAGNOSIS — G4730 Sleep apnea, unspecified: Secondary | ICD-10-CM | POA: Diagnosis not present

## 2017-08-12 DIAGNOSIS — Z794 Long term (current) use of insulin: Secondary | ICD-10-CM | POA: Insufficient documentation

## 2017-08-12 DIAGNOSIS — I5022 Chronic systolic (congestive) heart failure: Secondary | ICD-10-CM | POA: Diagnosis not present

## 2017-08-12 DIAGNOSIS — F17210 Nicotine dependence, cigarettes, uncomplicated: Secondary | ICD-10-CM | POA: Diagnosis not present

## 2017-08-12 DIAGNOSIS — Z8673 Personal history of transient ischemic attack (TIA), and cerebral infarction without residual deficits: Secondary | ICD-10-CM | POA: Insufficient documentation

## 2017-08-12 DIAGNOSIS — Z95810 Presence of automatic (implantable) cardiac defibrillator: Secondary | ICD-10-CM | POA: Insufficient documentation

## 2017-08-12 DIAGNOSIS — I2582 Chronic total occlusion of coronary artery: Secondary | ICD-10-CM | POA: Diagnosis not present

## 2017-08-12 LAB — POCT INR: INR: 1.7

## 2017-08-12 MED ORDER — FUROSEMIDE 40 MG PO TABS: 40.0000 mg | ORAL_TABLET | ORAL | 6 refills | Status: DC

## 2017-08-12 MED ORDER — SACUBITRIL-VALSARTAN 97-103 MG PO TABS: 1.0000 | ORAL_TABLET | Freq: Two times a day (BID) | ORAL | 6 refills | Status: DC

## 2017-08-12 NOTE — Progress Notes (Signed)
Patient ID: [name], male   DOB: 1961-01-15, 56 y.o.   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 CT showed R MCA stroke. Did not receive TPA due to delay in arrival. Had ECHO that showed EF 10-15% mild RV HK. No obvious LV clot. As he improved he was transferred to CIR but then developed hypotension and transferred back to HF ward. Underwent RHC on 4/27. Filling pressures mildly elevated CO ok. Diuresed gently. Meds limited by SBP in 80s. Could only tolerate ivabradine 7.5 bid, digoxin and spiro 12.5. Lasix and carvedilol stopped.   He presents today for regular follow up with Echo. Overall has been feeling great. Mild SOB with inclines or stairs. Continues to smoke 1/2 ppd. Goal is to stop by the end of the year. He will be 2 years sober from ETOH on 09/16/17. Had lightheadedness and SOB in ED 06/08/17. He felt better after waiting so did not stay for formal work up. Denies peripheral edema. Echo today EF 45-50%   Echo (9/17): EF 25-30%, moderate LV dilation with prominent trabeculation, normal RV size and systolic function.  Echo 08/12/17 EF 45-50%, much improved from previous. Personally reviewed   Labs (7/17): K 3.7, creatinine 1.19, BNP 106  RHC 02/07/15 RA = 3 RV = 50/4/7 PA = 55/14 (34) PCW = 22 Fick cardiac output/index = 4.6/2.4 Thermodilution CO/CI = 4.8/2.5 PVR = 2.5 WU SVR = 1443 FA sat = 96% PA sat = 51%, 56%, 61%  Noninvasive Ao pressure = 106/74 (86)   ECHO 01/2015: EF 10-15%.   CPX 02/07/2016 Peak VO2: 18.7 (54% predicted peak VO2) VE/VCO2 slope: 30 OUES: 1.62 Peak RER:  1.13 Ventilatory Threshold: 10.9 (32% predicted or measured  peak VO2)  CPX 12/24/16 Peak VO2 19.6 (58% predicted peak VO2) VE/VCO2 slope: 31 OUES: 1.79 Peak RER 1.09 Ventilatory threshold: 14.9 (44% predicted or measure peak VO2)   Past Medical History:  Diagnosis Date  . Automatic implantable cardioverter-defibrillator in situ   . CHF (congestive heart failure) ([place])   . Chronic systolic heart failure (HCC)    a) Mixed ICM/NICM b) RHC (05/2014): RA 2, RV 19/2/3, PA 22/14 (18), PCWP 6, Fick CO/CI: 5.2 / 2.7, PVR 2.3 WU, PA 60% and 64% c) ECHO (05/2014): EF 20-25%, diff HK, akinesis entireanteroseptal myocardium, triv AI, mod MR, LA mod/sev dilated  . Drug abuse and dependence ([place])   . Heart murmur   . High cholesterol   . Ischemic cardiomyopathy    a) Coronary angiography (12/2008) at [facility]: Lmain: nl, LAD mid 100% stenosis with left to left and right-to-left collaterals to the distal LAD; Lcx: nl, RCA nl.    . 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[place])   . Pneumonia 1988  . Sleep apnea    "cleared after T&A"  . Type II diabetes mellitus ([place])      Current Outpatient Prescriptions on File Prior to Encounter  Medication Sig Dispense Refill  . atorvastatin (LIPITOR) 20 MG tablet TAKE 1 TABLET BY MOUTH DAILY AT 6 PM. 30 tablet 6  . carvedilol (COREG) 6.25 MG tablet TAKE 1 TABLET BY MOUTH 2 TIMES DAILY WITH A MEAL.  60 tablet 3  . CORLANOR 7.5 MG TABS tablet TAKE 1 TABLET BY MOUTH 2 TIMES DAILY WITH A MEAL. 60 tablet 1  . digoxin (DIGOX) 0.25 MG tablet Take 0.5 tablets (125 mcg total) by mouth daily. 15 tablet 1  . furosemide (LASIX) 40 MG tablet TAKE 1 TABLET BY MOUTH EVERY DAY 40 tablet 6  . glucose blood (ONETOUCH VERIO) test strip Use as instructed 100 each 12  . Insulin Glargine (LANTUS SOLOSTAR) 100 UNIT/ML Solostar Pen Inject 10 Units into the skin daily at 10 pm.     . nicotine (NICODERM CQ - DOSED IN MG/24 HOURS) 21 mg/24hr patch Place 1 patch (21 mg total) onto the skin daily. CALL CHF CLINIC WHEN YOU RUN OUT TO GET 14 MG DOSE PATCHES SENT.  28 patch 0  . QC PEN NEEDLES 31G X 6 MM MISC 1 each by Other route See admin instructions. Reported on 12/18/2015    . sacubitril-valsartan (ENTRESTO) 24-26 MG Take 1 tablet by mouth 2 (two) times daily. 60 tablet 5  . spironolactone (ALDACTONE) 25 MG tablet Take 0.5 tablets (12.5 mg total) by mouth daily. 45 tablet 3  . tadalafil (CIALIS) 20 MG tablet Take 1 tablet (20 mg total) by mouth daily as needed for erectile dysfunction. 10 tablet 0  . warfarin (COUMADIN) 7.5 MG tablet TAKE BY MOUTH AS DIRECTED BY COUMADIN CLINIC 35 tablet 3   No current facility-administered medications on file prior to encounter.     No Known Allergies  Social History   Social History  . Marital status: Single    Spouse name: N/A  . Number of children: 1  . Years of education: 71   Occupational History  . Disabled    Social History Main Topics  . Smoking status: Current Some Day Smoker    Packs/day: 0.25    Years: 31.00    Types: Cigarettes  . Smokeless tobacco: Never Used  . Alcohol use 0.0 oz/week     Comment: 09/21/2014 "might have a drink q 6 /months, if that"  . Drug use: Yes    Types: Cocaine     Comment: 09/21/2014 "last cocaine ~ 1 1/2 months ago"  . Sexual activity: Yes   Other Topics Concern  . Not on file   Social History Narrative   Lives in [name] with his mother. No pets. Fun: movies, sports, daughter.    Denies religious beliefs that would effect health care.             Family History  Problem Relation Age of Onset  . Diabetes Mother   . Heart disease Mother   . Diabetes Father   . Prostate cancer Father   . Heart disease Father   . Diabetes Brother   . Diabetes Brother     Vitals:   08/12/17 1451  BP: 122/84  Pulse: 84  SpO2: 94%  Weight: 168 lb 12.8 oz (76.6 kg)   Wt Readings from Last 3 Encounters:  08/12/17 168 lb 12.8 oz (76.6 kg)  06/08/17 170 lb (77.1 kg)  03/25/17 168 lb 6 oz (76.4 kg)     PHYSICAL EXAM: General: Well appearing. No resp  difficulty. HEENT: Normal. Anicteric  Neck: Supple. JVP 5-6. Carotids 2+ bilat; no bruits. No thyromegaly or nodule noted. Cor: PMI nondisplaced. RRR, No M/G/R noted Lungs: CTAB, normal effort. Abdomen: Soft, non-tender, non-distended, no HSM. No bruits or masses. +BS  Extremities: Warm. No cyanosis, clubbing, or rash. R and LLE no edema.  Neuro: Alert & orientedx3, cranial nerves grossly intact. moves all 4 extremities w/o difficulty. Affect pleasant   ASSESSMENT & PLAN: 1. Chronic Systolic Heart Failure- 9628 ECHO EF 10-15% with non compaction s/p ICD - St Jude.- Followed by Dr Lovena Le.  - CPX 12/2016 with Moderate HF limitation/restrictive lung physiology. Stable from previous. - Echo 07/11/16 with EF slightly improved to 25-30% range. - NYHA II symptoms.  - Volume status stable on exam. Change lasix to 40 mg every OTHER day.  - Continue coreg 6.25 mg BID.   - Stop digoxin with EF recovery.  - Increase Entresto to 97/103 mg BID. BMET in 2 weeks.  - Continue 12.5 mg spironolactone daily.  - .Reinforced fluid restriction to < 2 L daily, sodium restriction to less than 2000 mg daily, and the importance of daily weights.    2. Hx of embolic CVA - INR managed per coumadin clinic.  - No bleeding. No change to regimen.  3. CAD Coronary angiography (12/2008) with mid LAD totally occluded and collateralized. - No s/s of ischemia.  - Continue atorvastatin 20 mg daily.  - No ASA with stable CAD and use of coumadin  for LV  noncompaction.  4. DMII - Per PCP.  5. Polysubstance abuse  - Sober since 09/17/2015. Attends NA 6 times weekly and lives with his sponsor.  - Pt will be 2 years sober in December. Congratulated.  6. Tobacco Abuse - Again encouraged to stop completely.   Doing well overall. Meds as above. Labs in 2 weeks. RTC 4 months.   Shirley Friar, PA-C  08/12/17   Patient seen and examined with the above-signed Advanced Practice Provider and/or Housestaff. I personally  reviewed laboratory data, imaging studies and relevant notes. I independently examined the patient and formulated the important aspects of the plan. I have edited the note to reflect any of my changes or salient points. I have personally discussed the plan with the patient and/or family.  Overall doing well NYHA II. Echo reviewed personally and EF 45-50%. Volume status ok. Will continue to titrate Entresto to 49/51 bid  Can stop digoxin. Can eventually consider weaning ivabradine as well. CAD stable. Counseled on need to stop smoking completely.   Glori Bickers, MD  6:45 PM

## 2017-08-12 NOTE — Progress Notes (Signed)
  Echocardiogram 2D Echocardiogram has been performed.  Darlina Sicilian M 08/12/2017, 3:14 PM

## 2017-08-12 NOTE — Patient Instructions (Signed)
Increase Entresto to 97/103 mg Twice daily   Decrease Furosemide to 40 mg every other day   Labs in 2 weeks  We will contact you in 4 months to schedule your next appointment.

## 2017-08-14 ENCOUNTER — Encounter: Payer: Self-pay | Admitting: Cardiology

## 2017-08-14 NOTE — Progress Notes (Signed)
Remote ICD transmission.   

## 2017-08-18 ENCOUNTER — Encounter: Payer: Self-pay | Admitting: Internal Medicine

## 2017-08-24 ENCOUNTER — Other Ambulatory Visit: Payer: Self-pay | Admitting: Internal Medicine

## 2017-08-26 ENCOUNTER — Ambulatory Visit (INDEPENDENT_AMBULATORY_CARE_PROVIDER_SITE_OTHER): Payer: Medicare Other | Admitting: *Deleted

## 2017-08-26 ENCOUNTER — Ambulatory Visit (HOSPITAL_COMMUNITY)
Admission: RE | Admit: 2017-08-26 | Discharge: 2017-08-26 | Disposition: A | Discharge status: Home / Self Care | Payer: Medicare Other | Source: Ambulatory Visit | Attending: Cardiology | Admitting: Cardiology

## 2017-08-26 DIAGNOSIS — Z7901 Long term (current) use of anticoagulants: Secondary | ICD-10-CM | POA: Diagnosis not present

## 2017-08-26 DIAGNOSIS — I428 Other cardiomyopathies: Secondary | ICD-10-CM

## 2017-08-26 DIAGNOSIS — I5022 Chronic systolic (congestive) heart failure: Secondary | ICD-10-CM | POA: Insufficient documentation

## 2017-08-26 LAB — BASIC METABOLIC PANEL
Anion gap: 8 (ref 5–15)
BUN: 11 mg/dL (ref 6–20)
CO2: 26 mmol/L (ref 22–32)
Calcium: 9.2 mg/dL (ref 8.9–10.3)
Chloride: 104 mmol/L (ref 101–111)
Creatinine, Ser: 1.22 mg/dL (ref 0.61–1.24)
GFR calc Af Amer: >60 mL/min (ref >60)
GFR calc non Af Amer: >60 mL/min (ref >60)
Glucose, Bld: 264 mg/dL — ABNORMAL HIGH (ref 65–99)
Potassium: 3.9 mmol/L (ref 3.5–5.1)
Sodium: 138 mmol/L (ref 135–145)

## 2017-08-26 LAB — POCT INR: INR: 1.9

## 2017-09-08 LAB — CUP PACEART REMOTE DEVICE CHECK
Battery Remaining Longevity: 33 mo
Battery Remaining Percentage: 33 %
Brady Statistic RA Percent Paced: 1 % — CL
Brady Statistic RV Percent Paced: 1 % — CL
Date Time Interrogation Session: 20181127161758
HighPow Impedance: 55 Ohm
Implantable Lead Implant Date: 20110513
Implantable Lead Implant Date: 20110513
Implantable Lead Location: 753859
Implantable Lead Location: 753860
Implantable Pulse Generator Implant Date: 20110513
Lead Channel Impedance Value: 310 Ohm
Lead Channel Impedance Value: 350 Ohm
Lead Channel Sensing Intrinsic Amplitude: 12 mV
Lead Channel Sensing Intrinsic Amplitude: 5 mV
Lead Channel Setting Pacing Amplitude: 2 V
Lead Channel Setting Pacing Amplitude: 2.5 V
Lead Channel Setting Pacing Pulse Width: 0.5 ms
Lead Channel Setting Sensing Sensitivity: 0.5 mV
Pulse Gen Serial Number: 608100

## 2017-09-11 ENCOUNTER — Ambulatory Visit (INDEPENDENT_AMBULATORY_CARE_PROVIDER_SITE_OTHER): Payer: Medicare Other | Admitting: Pharmacist

## 2017-09-11 DIAGNOSIS — Z7901 Long term (current) use of anticoagulants: Secondary | ICD-10-CM | POA: Diagnosis not present

## 2017-09-11 DIAGNOSIS — I428 Other cardiomyopathies: Secondary | ICD-10-CM | POA: Diagnosis not present

## 2017-09-11 LAB — POCT INR: INR: 2.3

## 2017-09-11 NOTE — Patient Instructions (Signed)
Continue  1 tablet every day except 1/2 tablet on Monday and Friday.  Recheck in 3 weeks.  Main # [phone] Coumadin Clinic # [phone].

## 2017-10-02 ENCOUNTER — Encounter (HOSPITAL_COMMUNITY): Payer: Self-pay

## 2017-10-02 NOTE — Progress Notes (Signed)
Medical Record request received by [facility] for documentation of HTN with supporting OV note and vitals flowsheet. Faxed to provided # [phone] Notation made that to my best efforts, no documentation of HTN could be found in patient's chart.  Sent vital flowsheets and 12/02/26 note (date of HTN diagnosis per [facility] request).  Renee Pain, RN

## 2017-10-09 ENCOUNTER — Ambulatory Visit (INDEPENDENT_AMBULATORY_CARE_PROVIDER_SITE_OTHER): Payer: Medicare Other | Admitting: *Deleted

## 2017-10-09 DIAGNOSIS — Z7901 Long term (current) use of anticoagulants: Secondary | ICD-10-CM | POA: Diagnosis not present

## 2017-10-09 DIAGNOSIS — I428 Other cardiomyopathies: Secondary | ICD-10-CM | POA: Diagnosis not present

## 2017-10-09 LAB — POCT INR: INR: 2.8

## 2017-10-09 NOTE — Patient Instructions (Signed)
Description   Continue taking 1 tablet every day except 1/2 tablet on Monday and Friday.  Recheck in 4 weeks.  Main # [phone] Coumadin Clinic # [phone].

## 2017-11-06 ENCOUNTER — Ambulatory Visit (INDEPENDENT_AMBULATORY_CARE_PROVIDER_SITE_OTHER): Payer: Medicare Other

## 2017-11-06 DIAGNOSIS — Z7901 Long term (current) use of anticoagulants: Secondary | ICD-10-CM | POA: Diagnosis not present

## 2017-11-06 DIAGNOSIS — I428 Other cardiomyopathies: Secondary | ICD-10-CM | POA: Diagnosis not present

## 2017-11-06 LAB — POCT INR: INR: 2.8

## 2017-11-11 ENCOUNTER — Telehealth: Payer: Self-pay | Admitting: Cardiology

## 2017-11-11 ENCOUNTER — Encounter: Payer: Medicare Other | Admitting: *Deleted

## 2017-11-11 NOTE — Telephone Encounter (Signed)
Spoke with pt and reminded pt of remote transmission that is due today. Pt verbalized understanding.   

## 2017-11-13 ENCOUNTER — Encounter: Payer: Self-pay | Admitting: Cardiology

## 2017-12-18 ENCOUNTER — Ambulatory Visit (INDEPENDENT_AMBULATORY_CARE_PROVIDER_SITE_OTHER): Payer: Medicare Other | Admitting: *Deleted

## 2017-12-18 DIAGNOSIS — Z7901 Long term (current) use of anticoagulants: Secondary | ICD-10-CM | POA: Diagnosis not present

## 2017-12-18 DIAGNOSIS — I428 Other cardiomyopathies: Secondary | ICD-10-CM | POA: Diagnosis not present

## 2017-12-18 LAB — POCT INR: INR: 1.7

## 2017-12-18 NOTE — Patient Instructions (Signed)
Description   Today take 1 tablet then continue taking 1 tablet every day except 1/2 tablet on Mondays and Fridays.  Recheck in 3 weeks.  Main # [phone] Coumadin Clinic # [phone].

## 2018-01-06 ENCOUNTER — Ambulatory Visit (INDEPENDENT_AMBULATORY_CARE_PROVIDER_SITE_OTHER): Payer: Medicare Other | Admitting: *Deleted

## 2018-01-06 ENCOUNTER — Encounter: Payer: Self-pay | Admitting: Internal Medicine

## 2018-01-06 ENCOUNTER — Ambulatory Visit (INDEPENDENT_AMBULATORY_CARE_PROVIDER_SITE_OTHER): Payer: Medicare Other | Admitting: Internal Medicine

## 2018-01-06 VITALS — BP 130/72 | HR 96 | Ht 72.0 in | Wt 171.0 lb

## 2018-01-06 DIAGNOSIS — I2510 Atherosclerotic heart disease of native coronary artery without angina pectoris: Secondary | ICD-10-CM

## 2018-01-06 DIAGNOSIS — Z95810 Presence of automatic (implantable) cardiac defibrillator: Secondary | ICD-10-CM | POA: Diagnosis not present

## 2018-01-06 DIAGNOSIS — I5022 Chronic systolic (congestive) heart failure: Secondary | ICD-10-CM

## 2018-01-06 DIAGNOSIS — Z7901 Long term (current) use of anticoagulants: Secondary | ICD-10-CM | POA: Diagnosis not present

## 2018-01-06 DIAGNOSIS — Z5181 Encounter for therapeutic drug level monitoring: Secondary | ICD-10-CM | POA: Diagnosis not present

## 2018-01-06 DIAGNOSIS — I428 Other cardiomyopathies: Secondary | ICD-10-CM | POA: Diagnosis not present

## 2018-01-06 DIAGNOSIS — I63411 Cerebral infarction due to embolism of right middle cerebral artery: Secondary | ICD-10-CM

## 2018-01-06 LAB — POCT INR: INR: 1.6

## 2018-01-06 NOTE — Patient Instructions (Signed)
Description   Pt states he missed dose on Monday night and then on Tuesday took 1 and 1/2 tablets then continue taking 1 tablet every day except 1/2 tablet on Mondays and Fridays.  Recheck in 2 weeks.  Main # [phone] Coumadin Clinic # [phone].

## 2018-01-06 NOTE — Patient Instructions (Signed)
Medication Instructions:  Your physician recommends that you continue on your current medications as directed. Please refer to the Current Medication list given to you today.  Labwork: None ordered.  Testing/Procedures: None ordered.  Follow-Up: Your physician wants you to follow-up in: one year with Dr. Lovena Le.   You will receive a reminder letter in the mail two months in advance. If you don't receive a letter, please call our office to schedule the follow-up appointment.  Remote monitoring is used to monitor your ICD from home. This monitoring reduces the number of office visits required to check your device to one time per year. It allows [place] to keep an eye on the functioning of your device to ensure it is working properly. You are scheduled for a device check from home on 04/07/2018. You may send your transmission at any time that day. If you have a wireless device, the transmission will be sent automatically. After your physician reviews your transmission, you will receive a postcard with your next transmission date.  Any Other Special Instructions Will Be Listed Below (If Applicable).  If you need a refill on your cardiac medications before your next appointment, please call your pharmacy.

## 2018-01-06 NOTE — Progress Notes (Signed)
HPI [name] returns today for ongoing evaluation and management of his ICD.  He is a very pleasant 57 year old man with a mixed cardiomyopathy, who underwent ICD insertion in 2011.  The patient has had no intermittent ICD shocks, and denies palpitations, chest pain, or syncope.  He denies peripheral edema. No Known Allergies   Current Outpatient Medications  Medication Sig Dispense Refill  . atorvastatin (LIPITOR) 20 MG tablet TAKE 1 TABLET BY MOUTH DAILY AT 6 PM. 30 tablet 6  . carvedilol (COREG) 6.25 MG tablet TAKE 1 TABLET BY MOUTH 2 TIMES DAILY WITH A MEAL. 60 tablet 3  . CORLANOR 7.5 MG TABS tablet TAKE 1 TABLET BY MOUTH 2 TIMES DAILY WITH A MEAL. 60 tablet 3  . furosemide (LASIX) 40 MG tablet Take 1 tablet (40 mg total) by mouth every other day. 40 tablet 6  . glucose blood (ONETOUCH VERIO) test strip Use as instructed 100 each 12  . Insulin Glargine (LANTUS SOLOSTAR) 100 UNIT/ML Solostar Pen Inject 10 Units into the skin daily at 10 pm.     . nicotine (NICODERM CQ - DOSED IN MG/24 HOURS) 21 mg/24hr patch Place 1 patch (21 mg total) onto the skin daily. CALL CHF CLINIC WHEN YOU RUN OUT TO GET 14 MG DOSE PATCHES SENT. 28 patch 0  . QC PEN NEEDLES 31G X 6 MM MISC 1 each by Other route See admin instructions. Reported on 12/18/2015    . sacubitril-valsartan (ENTRESTO) 97-103 MG Take 1 tablet by mouth 2 (two) times daily. 60 tablet 6  . spironolactone (ALDACTONE) 25 MG tablet Take 0.5 tablets (12.5 mg total) by mouth daily. 45 tablet 3  . tadalafil (CIALIS) 20 MG tablet Take 1 tablet (20 mg total) by mouth daily as needed for erectile dysfunction. 10 tablet 0  . warfarin (COUMADIN) 7.5 MG tablet TAKE BY MOUTH AS DIRECTED BY COUMADIN CLINIC 35 tablet 3   No current facility-administered medications for this visit.      Past Medical History:  Diagnosis Date  . Automatic implantable cardioverter-defibrillator in situ   . CHF (congestive heart failure) ([place])   . Chronic systolic  heart failure (HCC)    a) Mixed ICM/NICM b) RHC (05/2014): RA 2, RV 19/2/3, PA 22/14 (18), PCWP 6, Fick CO/CI: 5.2 / 2.7, PVR 2.3 WU, PA 60% and 64% c) ECHO (05/2014): EF 20-25%, diff HK, akinesis entireanteroseptal myocardium, triv AI, mod MR, LA mod/sev dilated  . Drug abuse and dependence ([place])   . Heart murmur   . High cholesterol   . Ischemic cardiomyopathy    a) Coronary angiography (12/2008) at [facility]: Lmain: nl, LAD mid 100% stenosis with left to left and right-to-left collaterals to the distal LAD; Lcx: nl, RCA nl.    . 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[place])   . Pneumonia 1988  . Sleep apnea    "cleared after T&A"  . Type II diabetes mellitus (HCC)     ROS:   All systems reviewed and negative except as noted in the HPI.   Past Surgical History:  Procedure Laterality Date  . CARDIAC CATHETERIZATION  05/2014  . CARDIAC DEFIBRILLATOR PLACEMENT  03/2011  . CHOLECYSTECTOMY  1994  . FOREARM FRACTURE SURGERY Left 1980  . FRACTURE SURGERY    . RIGHT HEART CATHETERIZATION N/A 05/30/2014   Procedure: RIGHT HEART CATH;  Surgeon: Jolaine Artist, MD;  Location: [facility] C[facility] LAB;  Service: Cardiovascular;  Laterality: N/A;  . RIGHT HEART CATHETERIZATION N/A 02/07/2015  Procedure: RIGHT HEART CATH;  Surgeon: Jolaine Artist, MD;  Location: [facility] CATH LAB;  Service: Cardiovascular;  Laterality: N/A;  . TONSILLECTOMY AND ADENOIDECTOMY  ~ 1998     Family History  Problem Relation Age of Onset  . Diabetes Mother   . Heart disease Mother   . Diabetes Father   . Prostate cancer Father   . Heart disease Father   . Diabetes Brother   . Diabetes Brother      Social History   Socioeconomic History  . Marital status: Single    Spouse name: Not on file  . Number of children: 1  . Years of education: 57  . Highest education level: Not on file  Occupational History  . Occupation: Disabled  Social Needs  . Financial resource strain: Not on file  . Food insecurity:    Worry: Not on file      Inability: Not on file  . Transportation needs:    Medical: Not on file    Non-medical: Not on file  Tobacco Use  . Smoking status: Current Some Day Smoker    Packs/day: 0.25    Years: 31.00    Pack years: 7.75    Types: Cigarettes  . Smokeless tobacco: Never Used  Substance and Sexual Activity  . Alcohol use: Yes    Alcohol/week: 0.0 oz    Comment: 09/21/2014 "might have a drink q 6 /months, if that"  . Drug use: Yes    Types: Cocaine    Comment: 09/21/2014 "last cocaine ~ 1 1/2 months ago"  . Sexual activity: Yes  Lifestyle  . Physical activity:    Days per week: Not on file    Minutes per session: Not on file  . Stress: Not on file  Relationships  . Social connections:    Talks on phone: Not on file    Gets together: Not on file    Attends religious service: Not on file    Active member of club or organization: Not on file    Attends meetings of clubs or organizations: Not on file    Relationship status: Not on file  . Intimate partner violence:    Fear of current or ex partner: Not on file    Emotionally abused: Not on file    Physically abused: Not on file    Forced sexual activity: Not on file  Other Topics Concern  . Not on file  Social History Narrative   Lives in [place] with his mother. No pets. Fun: movies, sports, daughter.    Denies religious beliefs that would effect health care.      BP 130/72   Pulse 96   Ht 6' (1.829 m)   Wt 171 lb (77.6 kg)   BMI 23.19 kg/m   Physical Exam:  Well appearing 57 year old man, NAD HEENT: Unremarkable Neck: 6 cm JVD, no thyromegally Lymphatics:  No adenopathy Back:  No CVA tenderness Lungs:  Clear, with no wheezes, rales, or rhonchi HEART:  Regular rate rhythm, no murmurs, no rubs, no clicks Abd:  soft, positive bowel sounds, no organomegally, no rebound, no guarding Ext:  2 plus pulses, no edema, no cyanosis, no clubbing Skin:  No rashes no nodules Neuro:  CN II through XII intact, motor grossly  intact  EKG-  normal sinus rhythm with left ventricular hypertrophy, possible prior anteroseptal MI  DEVICE  Normal device function.  See PaceArt for details.   Assess/Plan: 1.  Chronic systolic heart failure his symptoms are  class II and he is well compensated.-He will continue his current medical therapy. 2.  Coronary artery disease -he denies anginal symptoms and he remains active.  No change in medications. 3.  ICD his [place] -dual-chamber ICD is working normally.  He has had no ICD shocks.  Crissie Sickles, MD

## 2018-01-20 ENCOUNTER — Ambulatory Visit (INDEPENDENT_AMBULATORY_CARE_PROVIDER_SITE_OTHER): Payer: Medicare Other | Admitting: *Deleted

## 2018-01-20 DIAGNOSIS — Z7901 Long term (current) use of anticoagulants: Secondary | ICD-10-CM

## 2018-01-20 DIAGNOSIS — I428 Other cardiomyopathies: Secondary | ICD-10-CM | POA: Diagnosis not present

## 2018-01-20 LAB — POCT INR: INR: 2.5

## 2018-01-20 NOTE — Patient Instructions (Signed)
Description   Continue taking 1 tablet every day except 1/2 tablet on Mondays and Fridays.  Recheck in 3 weeks.  Main # [phone] Coumadin Clinic # [phone].

## 2018-01-25 ENCOUNTER — Other Ambulatory Visit (HOSPITAL_COMMUNITY): Payer: Self-pay | Admitting: Adult Health

## 2018-02-10 ENCOUNTER — Ambulatory Visit (INDEPENDENT_AMBULATORY_CARE_PROVIDER_SITE_OTHER): Payer: Medicare Other | Admitting: *Deleted

## 2018-02-10 DIAGNOSIS — Z5181 Encounter for therapeutic drug level monitoring: Secondary | ICD-10-CM

## 2018-02-10 DIAGNOSIS — Z7901 Long term (current) use of anticoagulants: Secondary | ICD-10-CM

## 2018-02-10 DIAGNOSIS — I63411 Cerebral infarction due to embolism of right middle cerebral artery: Secondary | ICD-10-CM

## 2018-02-10 DIAGNOSIS — I428 Other cardiomyopathies: Secondary | ICD-10-CM

## 2018-02-10 LAB — POCT INR: INR: 3

## 2018-02-10 NOTE — Patient Instructions (Signed)
Description   Continue taking 1 tablet every day except 1/2 tablet on Mondays and Fridays.  Recheck in 4 weeks.  Main # [phone] Coumadin Clinic # [phone].

## 2018-03-10 ENCOUNTER — Ambulatory Visit (INDEPENDENT_AMBULATORY_CARE_PROVIDER_SITE_OTHER): Payer: Medicare Other | Admitting: *Deleted

## 2018-03-10 DIAGNOSIS — Z7901 Long term (current) use of anticoagulants: Secondary | ICD-10-CM

## 2018-03-10 DIAGNOSIS — I428 Other cardiomyopathies: Secondary | ICD-10-CM

## 2018-03-10 LAB — POCT INR: INR: 2.8 (ref 2.0–3.0)

## 2018-03-10 NOTE — Patient Instructions (Signed)
Description   Continue taking 1 tablet every day except 1/2 tablet on Mondays and Fridays.  Recheck in 4 weeks.  Main # [phone] Coumadin Clinic # [phone].

## 2018-04-07 ENCOUNTER — Ambulatory Visit (INDEPENDENT_AMBULATORY_CARE_PROVIDER_SITE_OTHER): Payer: Medicare Other | Admitting: *Deleted

## 2018-04-07 ENCOUNTER — Encounter: Payer: Medicare Other | Admitting: *Deleted

## 2018-04-07 ENCOUNTER — Telehealth: Payer: Self-pay

## 2018-04-07 DIAGNOSIS — Z5181 Encounter for therapeutic drug level monitoring: Secondary | ICD-10-CM | POA: Diagnosis not present

## 2018-04-07 DIAGNOSIS — I428 Other cardiomyopathies: Secondary | ICD-10-CM | POA: Diagnosis not present

## 2018-04-07 DIAGNOSIS — I63411 Cerebral infarction due to embolism of right middle cerebral artery: Secondary | ICD-10-CM | POA: Diagnosis not present

## 2018-04-07 DIAGNOSIS — Z7901 Long term (current) use of anticoagulants: Secondary | ICD-10-CM

## 2018-04-07 LAB — POCT INR: INR: 2.5 (ref 2.0–3.0)

## 2018-04-07 NOTE — Patient Instructions (Signed)
Description   Continue taking 1 tablet every day except 1/2 tablet on Mondays and Fridays.  Recheck in 6 weeks.  Main # [phone] Coumadin Clinic # [phone].

## 2018-04-07 NOTE — Telephone Encounter (Signed)
LMOVM reminding pt to send remote transmission.   

## 2018-04-08 ENCOUNTER — Encounter: Payer: Self-pay | Admitting: Cardiology

## 2018-05-07 ENCOUNTER — Encounter: Payer: Self-pay | Admitting: Cardiology

## 2018-05-19 ENCOUNTER — Encounter (HOSPITAL_COMMUNITY): Payer: Medicare Other

## 2018-05-28 NOTE — Progress Notes (Signed)
Patient ID: Dennis [name], male   DOB: 03/07/1961, 57 y.o.   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 CT showed R MCA stroke. Did not receive TPA due to delay in arrival. Had ECHO that showed EF 10-15% mild RV HK. No obvious LV clot. As he improved he was transferred to CIR but then developed hypotension and transferred back to HF ward. Underwent RHC on 4/27. Filling pressures mildly elevated CO ok. Diuresed gently. Meds limited by SBP in 80s. Could only tolerate ivabradine 7.5 bid, digoxin and spiro 12.5. Lasix and carvedilol stopped.   Today he returns for HF follow up. Overall feeling fine. Denies SOB/PND/Orthopnea. Appetite ok. No fever or chills. Weight at home has been stable. Taking all medications but he did not take his medications this morning. Smoking 12 cigarettes daily. Attends Narcotics Anonymous 6 days a week.    ECHO 2016 EF 15%.  Echo (9/17): EF 25-30%, moderate LV dilation with prominent trabeculation, normal RV size and systolic function.  Echo 08/12/17 EF 45-50%, much improved from previous  Per Dr Vaughan Browner  [place] 02/07/15 RA = 3 RV = 50/4/7 PA = 55/14 (34) PCW = 22 Fick cardiac output/index = 4.6/2.4 Thermodilution CO/CI = 4.8/2.5 PVR = 2.5 WU SVR = 1443 FA sat = 96% PA sat = 51%, 56%, 61%  Noninvasive Ao pressure = 106/74 (86)   CPX 02/07/2016 Peak VO2: 18.7 (54% predicted peak VO2) VE/VCO2 slope: 30 OUES: 1.62 Peak RER:  1.13 Ventilatory Threshold: 10.9 (32% predicted or measured peak VO2)  CPX 12/24/16 Peak VO2 19.6 (58% predicted peak VO2) VE/VCO2 slope: 31 OUES: 1.79 Peak RER 1.09 Ventilatory threshold: 14.9  (44% predicted or measure peak VO2)   Past Medical History:  Diagnosis Date  . Automatic implantable cardioverter-defibrillator in situ   . CHF (congestive heart failure) ([place])   . Chronic systolic heart failure (HCC)    a) Mixed ICM/NICM b) RHC (05/2014): RA 2, RV 19/2/3, PA 22/14 (18), PCWP 6, Fick CO/CI: 5.2 / 2.7, PVR 2.3 WU, PA 60% and 64% c) ECHO (05/2014): EF 20-25%, diff HK, akinesis entireanteroseptal myocardium, triv AI, mod MR, LA mod/sev dilated  . Drug abuse and dependence ([place])   . Heart murmur   . High cholesterol   . Ischemic cardiomyopathy    a) Coronary angiography (12/2008) at [facility]: Lmain: nl, LAD mid 100% stenosis with left to left and right-to-left collaterals to the distal LAD; Lcx: nl, RCA nl.    . 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[place])   . Pneumonia 1988  . Sleep apnea    "cleared after T&A"  . Type II diabetes mellitus ([place])      Current Outpatient Medications on File Prior to Encounter  Medication Sig Dispense Refill  . atorvastatin (LIPITOR) 20 MG tablet TAKE 1 TABLET BY MOUTH DAILY AT 6 PM. 30 tablet 6  . carvedilol (COREG) 6.25 MG tablet TAKE 1 TABLET BY MOUTH 2 TIMES DAILY WITH A MEAL. 60 tablet 3  . CORLANOR 7.5 MG TABS tablet TAKE 1 TABLET BY MOUTH 2 TIMES DAILY WITH A MEAL. 60 tablet 3  . furosemide (LASIX) 40 MG  tablet Take 1 tablet (40 mg total) by mouth every other day. 40 tablet 6  . glucose blood (ONETOUCH VERIO) test strip Use as instructed 100 each 12  . Insulin Glargine (LANTUS SOLOSTAR) 100 UNIT/ML Solostar Pen Inject 10 Units into the skin daily at 10 pm.     . nicotine (NICODERM CQ - DOSED IN MG/24 HOURS) 21 mg/24hr patch Place 1 patch (21 mg total) onto the skin daily. CALL CHF CLINIC WHEN YOU RUN OUT TO GET 14 MG DOSE PATCHES SENT. 28 patch 0  . QC PEN NEEDLES 31G X 6 MM MISC 1 each by Other route See admin instructions. Reported on 12/18/2015    . sacubitril-valsartan (ENTRESTO) 97-103 MG Take 1 tablet by mouth 2 (two) times daily. 60 tablet 6    . spironolactone (ALDACTONE) 25 MG tablet TAKE 1/2 TABLET BY MOUTH DAILY. 45 tablet 3  . tadalafil (CIALIS) 20 MG tablet Take 1 tablet (20 mg total) by mouth daily as needed for erectile dysfunction. 10 tablet 0  . warfarin (COUMADIN) 7.5 MG tablet TAKE BY MOUTH AS DIRECTED BY COUMADIN CLINIC 35 tablet 3   No current facility-administered medications on file prior to encounter.     No Known Allergies  Social History   Socioeconomic History  . Marital status: Single    Spouse name: Not on file  . Number of children: 1  . Years of education: 62  . Highest education level: Not on file  Occupational History  . Occupation: Disabled  Social Needs  . Financial resource strain: Not on file  . Food insecurity:    Worry: Not on file    Inability: Not on file  . Transportation needs:    Medical: Not on file    Non-medical: Not on file  Tobacco Use  . Smoking status: Current Some Day Smoker    Packs/day: 0.25    Years: 31.00    Pack years: 7.75    Types: Cigarettes  . Smokeless tobacco: Never Used  Substance and Sexual Activity  . Alcohol use: Yes    Alcohol/week: 0.0 standard drinks    Comment: 09/21/2014 "might have a drink q 6 /months, if that"  . Drug use: Yes    Types: Cocaine    Comment: 09/21/2014 "last cocaine ~ 1 1/2 months ago"  . Sexual activity: Yes  Lifestyle  . Physical activity:    Days per week: Not on file    Minutes per session: Not on file  . Stress: Not on file  Relationships  . Social connections:    Talks on phone: Not on file    Gets together: Not on file    Attends religious service: Not on file    Active member of club or organization: Not on file    Attends meetings of clubs or organizations: Not on file    Relationship status: Not on file  . Intimate partner violence:    Fear of current or ex partner: Not on file    Emotionally abused: Not on file    Physically abused: Not on file    Forced sexual activity: Not on file  Other Topics Concern   . Not on file  Social History Narrative   Lives in [name] with his mother. No pets. Fun: movies, sports, daughter.    Denies religious beliefs that would effect health care.             Family History  Problem Relation Age of Onset  . Diabetes Mother   .  Heart disease Mother   . Diabetes Father   . Prostate cancer Father   . Heart disease Father   . Diabetes Brother   . Diabetes Brother     Vitals:   05/31/18 1420  BP: 138/90  Pulse: 90  SpO2: 95%  Weight: 72.7 kg (160 lb 3.2 oz)   Wt Readings from Last 3 Encounters:  05/31/18 72.7 kg (160 lb 3.2 oz)  01/06/18 77.6 kg (171 lb)  08/12/17 76.6 kg (168 lb 12.8 oz)     PHYSICAL EXAM: General:  Well appearing. No resp difficulty HEENT: normal Neck: supple. no JVD. Carotids 2+ bilat; no bruits. No lymphadenopathy or thryomegaly appreciated. Cor: PMI nondisplaced. Regular rate & rhythm. No rubs, gallops or murmurs. Lungs: clear Abdomen: soft, nontender, nondistended. No hepatosplenomegaly. No bruits or masses. Good bowel sounds. Extremities: no cyanosis, clubbing, rash, edema Neuro: alert & orientedx3, cranial nerves grossly intact. moves all 4 extremities w/o difficulty. Affect pleasant   ASSESSMENT & PLAN: 1. Chronic Systolic Heart Failure-Mixed NICM/ICM .  S/P [place]vue Dual Chamber. ICD - . -2016 ECHO EF 10-15% with non compaction-->ECHO Ef 45-50% per Dr Vaughan Browner.   - CPX 12/2016 with Moderate HF limitation/restrictive lung physiology. Stable from previous. - NYHA I. Volume status stable. Change lasix to as needed.  Change lasix to 40 mg every OTHER day.  - Continue coreg 6.25 mg BID.   - Continue entresto to 97/103 mg BID.  - Continue 12.5 mg spironolactone daily.  2. Hx of embolic CVA - INR managed per coumadin clinic.  - No bleeding. No change to regimen.  3. CAD Coronary angiography (12/2008) with mid LAD totally occluded and collateralized. - No s/s ischemia  - Continue atorvastatin 20 mg daily.  - No  ASA with stable CAD and use of coumadin  for LV  noncompaction.  4. DMII - Per PCP.  5. Polysubstance abuse  - Sober since 09/17/2015. Attends NA 6 times weekly and lives with his sponsor.  - Pt will be 2 years sober in December. Congratulated.  6. Tobacco Abuse -Discussed smoking cessation.   Check BMET  Follow up in 6 months with an ECHO.     Darrick Grinder, NP  05/31/18

## 2018-05-31 ENCOUNTER — Ambulatory Visit (HOSPITAL_COMMUNITY)
Admission: RE | Admit: 2018-05-31 | Discharge: 2018-05-31 | Disposition: A | Discharge status: Home / Self Care | Payer: Medicare Other | Source: Ambulatory Visit | Attending: Internal Medicine | Admitting: Internal Medicine

## 2018-05-31 ENCOUNTER — Ambulatory Visit (INDEPENDENT_AMBULATORY_CARE_PROVIDER_SITE_OTHER): Payer: Medicare Other | Admitting: *Deleted

## 2018-05-31 ENCOUNTER — Encounter (HOSPITAL_COMMUNITY): Payer: Self-pay

## 2018-05-31 VITALS — BP 138/90 | HR 90 | Wt 160.2 lb

## 2018-05-31 DIAGNOSIS — E119 Type 2 diabetes mellitus without complications: Secondary | ICD-10-CM | POA: Insufficient documentation

## 2018-05-31 DIAGNOSIS — I2510 Atherosclerotic heart disease of native coronary artery without angina pectoris: Secondary | ICD-10-CM | POA: Insufficient documentation

## 2018-05-31 DIAGNOSIS — Z720 Tobacco use: Secondary | ICD-10-CM | POA: Diagnosis not present

## 2018-05-31 DIAGNOSIS — Z8673 Personal history of transient ischemic attack (TIA), and cerebral infarction without residual deficits: Secondary | ICD-10-CM | POA: Diagnosis not present

## 2018-05-31 DIAGNOSIS — Z79899 Other long term (current) drug therapy: Secondary | ICD-10-CM | POA: Insufficient documentation

## 2018-05-31 DIAGNOSIS — I5022 Chronic systolic (congestive) heart failure: Secondary | ICD-10-CM | POA: Diagnosis present

## 2018-05-31 DIAGNOSIS — F17210 Nicotine dependence, cigarettes, uncomplicated: Secondary | ICD-10-CM | POA: Insufficient documentation

## 2018-05-31 DIAGNOSIS — I2582 Chronic total occlusion of coronary artery: Secondary | ICD-10-CM | POA: Insufficient documentation

## 2018-05-31 DIAGNOSIS — I428 Other cardiomyopathies: Secondary | ICD-10-CM | POA: Diagnosis not present

## 2018-05-31 DIAGNOSIS — I255 Ischemic cardiomyopathy: Secondary | ICD-10-CM | POA: Diagnosis not present

## 2018-05-31 DIAGNOSIS — Z7901 Long term (current) use of anticoagulants: Secondary | ICD-10-CM | POA: Diagnosis not present

## 2018-05-31 DIAGNOSIS — Z9119 Patient's noncompliance with other medical treatment and regimen: Secondary | ICD-10-CM | POA: Insufficient documentation

## 2018-05-31 LAB — BASIC METABOLIC PANEL
Anion gap: 10 (ref 5–15)
BUN: 17 mg/dL (ref 6–20)
CO2: 25 mmol/L (ref 22–32)
Calcium: 9.4 mg/dL (ref 8.9–10.3)
Chloride: 95 mmol/L — ABNORMAL LOW (ref 98–111)
Creatinine, Ser: 1.32 mg/dL — ABNORMAL HIGH (ref 0.61–1.24)
GFR calc Af Amer: >60 mL/min (ref >60)
GFR calc non Af Amer: 59 mL/min — ABNORMAL LOW (ref >60)
Glucose, Bld: 606 mg/dL (ref 70–99)
Potassium: 4.1 mmol/L (ref 3.5–5.1)
Sodium: 130 mmol/L — ABNORMAL LOW (ref 135–145)

## 2018-05-31 MED ORDER — FUROSEMIDE 40 MG PO TABS: 40.0000 mg | ORAL_TABLET | ORAL | 6 refills | Status: DC | PRN

## 2018-05-31 NOTE — Patient Instructions (Signed)
CHANGE Lasix to as needed   Labs today We will only contact you if something comes back abnormal or we need to make some changes. Otherwise no news is good news!  Your physician recommends that you schedule a follow-up appointment in: 6 months  Your physician has requested that you have an echocardiogram. Echocardiography is a painless test that uses sound waves to create images of your heart. It provides your doctor with information about the size and shape of your heart and how well your heart's chambers and valves are working. This procedure takes approximately one hour. There are no restrictions for this procedure.

## 2018-06-01 NOTE — Progress Notes (Signed)
Remote ICD transmission.   

## 2018-06-08 ENCOUNTER — Ambulatory Visit (INDEPENDENT_AMBULATORY_CARE_PROVIDER_SITE_OTHER): Payer: Medicare Other | Admitting: *Deleted

## 2018-06-08 DIAGNOSIS — I428 Other cardiomyopathies: Secondary | ICD-10-CM | POA: Diagnosis not present

## 2018-06-08 DIAGNOSIS — Z7901 Long term (current) use of anticoagulants: Secondary | ICD-10-CM

## 2018-06-08 LAB — POCT INR: INR: 2.9 (ref 2.0–3.0)

## 2018-06-08 NOTE — Patient Instructions (Signed)
Description   Continue taking 1 tablet every day except 1/2 tablet on Mondays and Fridays.  Recheck in 6 weeks.  Main # [phone] Coumadin Clinic # [phone].

## 2018-07-05 LAB — CUP PACEART REMOTE DEVICE CHECK
Battery Remaining Longevity: 29 mo
Battery Remaining Percentage: 27 %
Battery Voltage: 2.83 V
Brady Statistic AP VP Percent: 1 %
Brady Statistic AP VS Percent: 1 %
Brady Statistic AS VP Percent: 1 %
Brady Statistic AS VS Percent: 99 %
Brady Statistic RA Percent Paced: 1 %
Brady Statistic RV Percent Paced: 1 %
Date Time Interrogation Session: 20190819181524
HighPow Impedance: 61 Ohm
HighPow Impedance: 61 Ohm
Implantable Lead Implant Date: 20110513
Implantable Lead Implant Date: 20110513
Implantable Lead Location: 753859
Implantable Lead Location: 753860
Implantable Pulse Generator Implant Date: 20110513
Lead Channel Impedance Value: 300 Ohm
Lead Channel Impedance Value: 350 Ohm
Lead Channel Pacing Threshold Amplitude: 0.5 V
Lead Channel Pacing Threshold Amplitude: 0.75 V
Lead Channel Pacing Threshold Pulse Width: 0.5 ms
Lead Channel Pacing Threshold Pulse Width: 0.5 ms
Lead Channel Sensing Intrinsic Amplitude: 12 mV
Lead Channel Sensing Intrinsic Amplitude: 5 mV
Lead Channel Setting Pacing Amplitude: 2 V
Lead Channel Setting Pacing Amplitude: 2.5 V
Lead Channel Setting Pacing Pulse Width: 0.5 ms
Lead Channel Setting Sensing Sensitivity: 0.5 mV
Pulse Gen Serial Number: 608100

## 2018-07-28 ENCOUNTER — Ambulatory Visit (INDEPENDENT_AMBULATORY_CARE_PROVIDER_SITE_OTHER): Payer: Medicare Other | Admitting: *Deleted

## 2018-07-28 DIAGNOSIS — I428 Other cardiomyopathies: Secondary | ICD-10-CM

## 2018-07-28 DIAGNOSIS — Z7901 Long term (current) use of anticoagulants: Secondary | ICD-10-CM

## 2018-07-28 LAB — POCT INR: INR: 3.3 — AB (ref 2.0–3.0)

## 2018-07-28 NOTE — Patient Instructions (Signed)
Description   Today take 1/2 tablet, then Continue taking 1 tablet every day except 1/2 tablet on Mondays and Fridays.  Recheck in 5 weeks.  Main # [phone] Coumadin Clinic # [phone].

## 2018-08-30 ENCOUNTER — Telehealth: Payer: Self-pay

## 2018-08-30 NOTE — Telephone Encounter (Signed)
LMOVM reminding pt to send remote transmission.   

## 2018-09-01 ENCOUNTER — Ambulatory Visit (INDEPENDENT_AMBULATORY_CARE_PROVIDER_SITE_OTHER): Payer: Medicare Other | Admitting: *Deleted

## 2018-09-01 DIAGNOSIS — Z7901 Long term (current) use of anticoagulants: Secondary | ICD-10-CM

## 2018-09-01 DIAGNOSIS — I428 Other cardiomyopathies: Secondary | ICD-10-CM

## 2018-09-01 LAB — POCT INR: INR: 3.9 — AB (ref 2.0–3.0)

## 2018-09-01 NOTE — Patient Instructions (Addendum)
Description   Hold today's dose then start taking 1 tablet every day except 1/2 tablet on Mondays, Wednesdays, and Fridays.  Recheck in 3 weeks.  Main # [phone] Coumadin Clinic # [phone].

## 2018-09-02 ENCOUNTER — Encounter: Payer: Self-pay | Admitting: Cardiology

## 2018-09-04 ENCOUNTER — Encounter (HOSPITAL_COMMUNITY): Payer: Self-pay | Admitting: Emergency Medicine

## 2018-09-04 ENCOUNTER — Emergency Department (HOSPITAL_COMMUNITY): Payer: Medicare Other

## 2018-09-04 ENCOUNTER — Other Ambulatory Visit: Payer: Self-pay

## 2018-09-04 ENCOUNTER — Observation Stay (HOSPITAL_COMMUNITY)
Admission: EM | Admit: 2018-09-04 | Discharge: 2018-09-05 | Disposition: A | Discharge status: Home / Self Care | Payer: Medicare Other | Attending: Internal Medicine | Admitting: Internal Medicine

## 2018-09-04 DIAGNOSIS — F1410 Cocaine abuse, uncomplicated: Secondary | ICD-10-CM | POA: Insufficient documentation

## 2018-09-04 DIAGNOSIS — R0602 Shortness of breath: Secondary | ICD-10-CM | POA: Insufficient documentation

## 2018-09-04 DIAGNOSIS — E119 Type 2 diabetes mellitus without complications: Secondary | ICD-10-CM | POA: Insufficient documentation

## 2018-09-04 DIAGNOSIS — R079 Chest pain, unspecified: Secondary | ICD-10-CM | POA: Diagnosis present

## 2018-09-04 DIAGNOSIS — F1011 Alcohol abuse, in remission: Secondary | ICD-10-CM | POA: Diagnosis not present

## 2018-09-04 DIAGNOSIS — R0789 Other chest pain: Principal | ICD-10-CM | POA: Insufficient documentation

## 2018-09-04 DIAGNOSIS — E7800 Pure hypercholesterolemia, unspecified: Secondary | ICD-10-CM | POA: Insufficient documentation

## 2018-09-04 DIAGNOSIS — G4730 Sleep apnea, unspecified: Secondary | ICD-10-CM | POA: Diagnosis not present

## 2018-09-04 DIAGNOSIS — Z7984 Long term (current) use of oral hypoglycemic drugs: Secondary | ICD-10-CM | POA: Diagnosis not present

## 2018-09-04 DIAGNOSIS — Z7901 Long term (current) use of anticoagulants: Secondary | ICD-10-CM | POA: Insufficient documentation

## 2018-09-04 DIAGNOSIS — Z794 Long term (current) use of insulin: Secondary | ICD-10-CM | POA: Diagnosis not present

## 2018-09-04 DIAGNOSIS — F17210 Nicotine dependence, cigarettes, uncomplicated: Secondary | ICD-10-CM | POA: Insufficient documentation

## 2018-09-04 DIAGNOSIS — R0989 Other specified symptoms and signs involving the circulatory and respiratory systems: Secondary | ICD-10-CM | POA: Diagnosis present

## 2018-09-04 DIAGNOSIS — Z79899 Other long term (current) drug therapy: Secondary | ICD-10-CM | POA: Diagnosis not present

## 2018-09-04 DIAGNOSIS — I110 Hypertensive heart disease with heart failure: Secondary | ICD-10-CM | POA: Diagnosis not present

## 2018-09-04 DIAGNOSIS — Z95810 Presence of automatic (implantable) cardiac defibrillator: Secondary | ICD-10-CM | POA: Diagnosis not present

## 2018-09-04 DIAGNOSIS — F1014 Alcohol abuse with alcohol-induced mood disorder: Secondary | ICD-10-CM | POA: Diagnosis present

## 2018-09-04 DIAGNOSIS — I2510 Atherosclerotic heart disease of native coronary artery without angina pectoris: Secondary | ICD-10-CM | POA: Insufficient documentation

## 2018-09-04 DIAGNOSIS — I5023 Acute on chronic systolic (congestive) heart failure: Secondary | ICD-10-CM | POA: Insufficient documentation

## 2018-09-04 DIAGNOSIS — I5042 Chronic combined systolic (congestive) and diastolic (congestive) heart failure: Secondary | ICD-10-CM | POA: Diagnosis present

## 2018-09-04 DIAGNOSIS — E11319 Type 2 diabetes mellitus with unspecified diabetic retinopathy without macular edema: Secondary | ICD-10-CM

## 2018-09-04 DIAGNOSIS — I5022 Chronic systolic (congestive) heart failure: Secondary | ICD-10-CM | POA: Diagnosis present

## 2018-09-04 DIAGNOSIS — I10 Essential (primary) hypertension: Secondary | ICD-10-CM | POA: Diagnosis present

## 2018-09-04 HISTORY — DX: Chest pain, unspecified: R07.9

## 2018-09-04 LAB — I-STAT TROPONIN, ED
Troponin i, poc: 0.02 ng/mL (ref 0.00–0.08)
Troponin i, poc: 0 ng/mL (ref 0.00–0.08)

## 2018-09-04 LAB — BASIC METABOLIC PANEL
Anion gap: 6 (ref 5–15)
BUN: 11 mg/dL (ref 6–20)
CO2: 26 mmol/L (ref 22–32)
Calcium: 9 mg/dL (ref 8.9–10.3)
Chloride: 106 mmol/L (ref 98–111)
Creatinine, Ser: 1.33 mg/dL — ABNORMAL HIGH (ref 0.61–1.24)
GFR calc Af Amer: >60 mL/min (ref >60)
GFR calc non Af Amer: 58 mL/min — ABNORMAL LOW (ref >60)
Glucose, Bld: 237 mg/dL — ABNORMAL HIGH (ref 70–99)
Potassium: 4.1 mmol/L (ref 3.5–5.1)
Sodium: 138 mmol/L (ref 135–145)

## 2018-09-04 LAB — CBC
HCT: 44.1 % (ref 39.0–52.0)
Hemoglobin: 12.9 g/dL — ABNORMAL LOW (ref 13.0–17.0)
MCH: 29.1 pg (ref 26.0–34.0)
MCHC: 29.3 g/dL — ABNORMAL LOW (ref 30.0–36.0)
MCV: 99.5 fL (ref 80.0–100.0)
Platelets: 244 10*3/uL (ref 150–400)
RBC: 4.43 MIL/uL (ref 4.22–5.81)
RDW: 13.5 % (ref 11.5–15.5)
WBC: 8.5 10*3/uL (ref 4.0–10.5)
nRBC: 0 % (ref 0.0–0.2)

## 2018-09-04 LAB — PROTIME-INR
INR: 1.7
Prothrombin Time: 19.8 seconds — ABNORMAL HIGH (ref 11.4–15.2)

## 2018-09-04 LAB — BRAIN NATRIURETIC PEPTIDE: B Natriuretic Peptide: 199.5 pg/mL — ABNORMAL HIGH (ref 0.0–100.0)

## 2018-09-04 IMAGING — CR DG CHEST 2V
2 series · 2 of 2 positions shown · non-contrast
Comparison: [DATE]

CLINICAL DATA: Chest pain

EXAM:
CHEST - 2 VIEW

[chest pa]
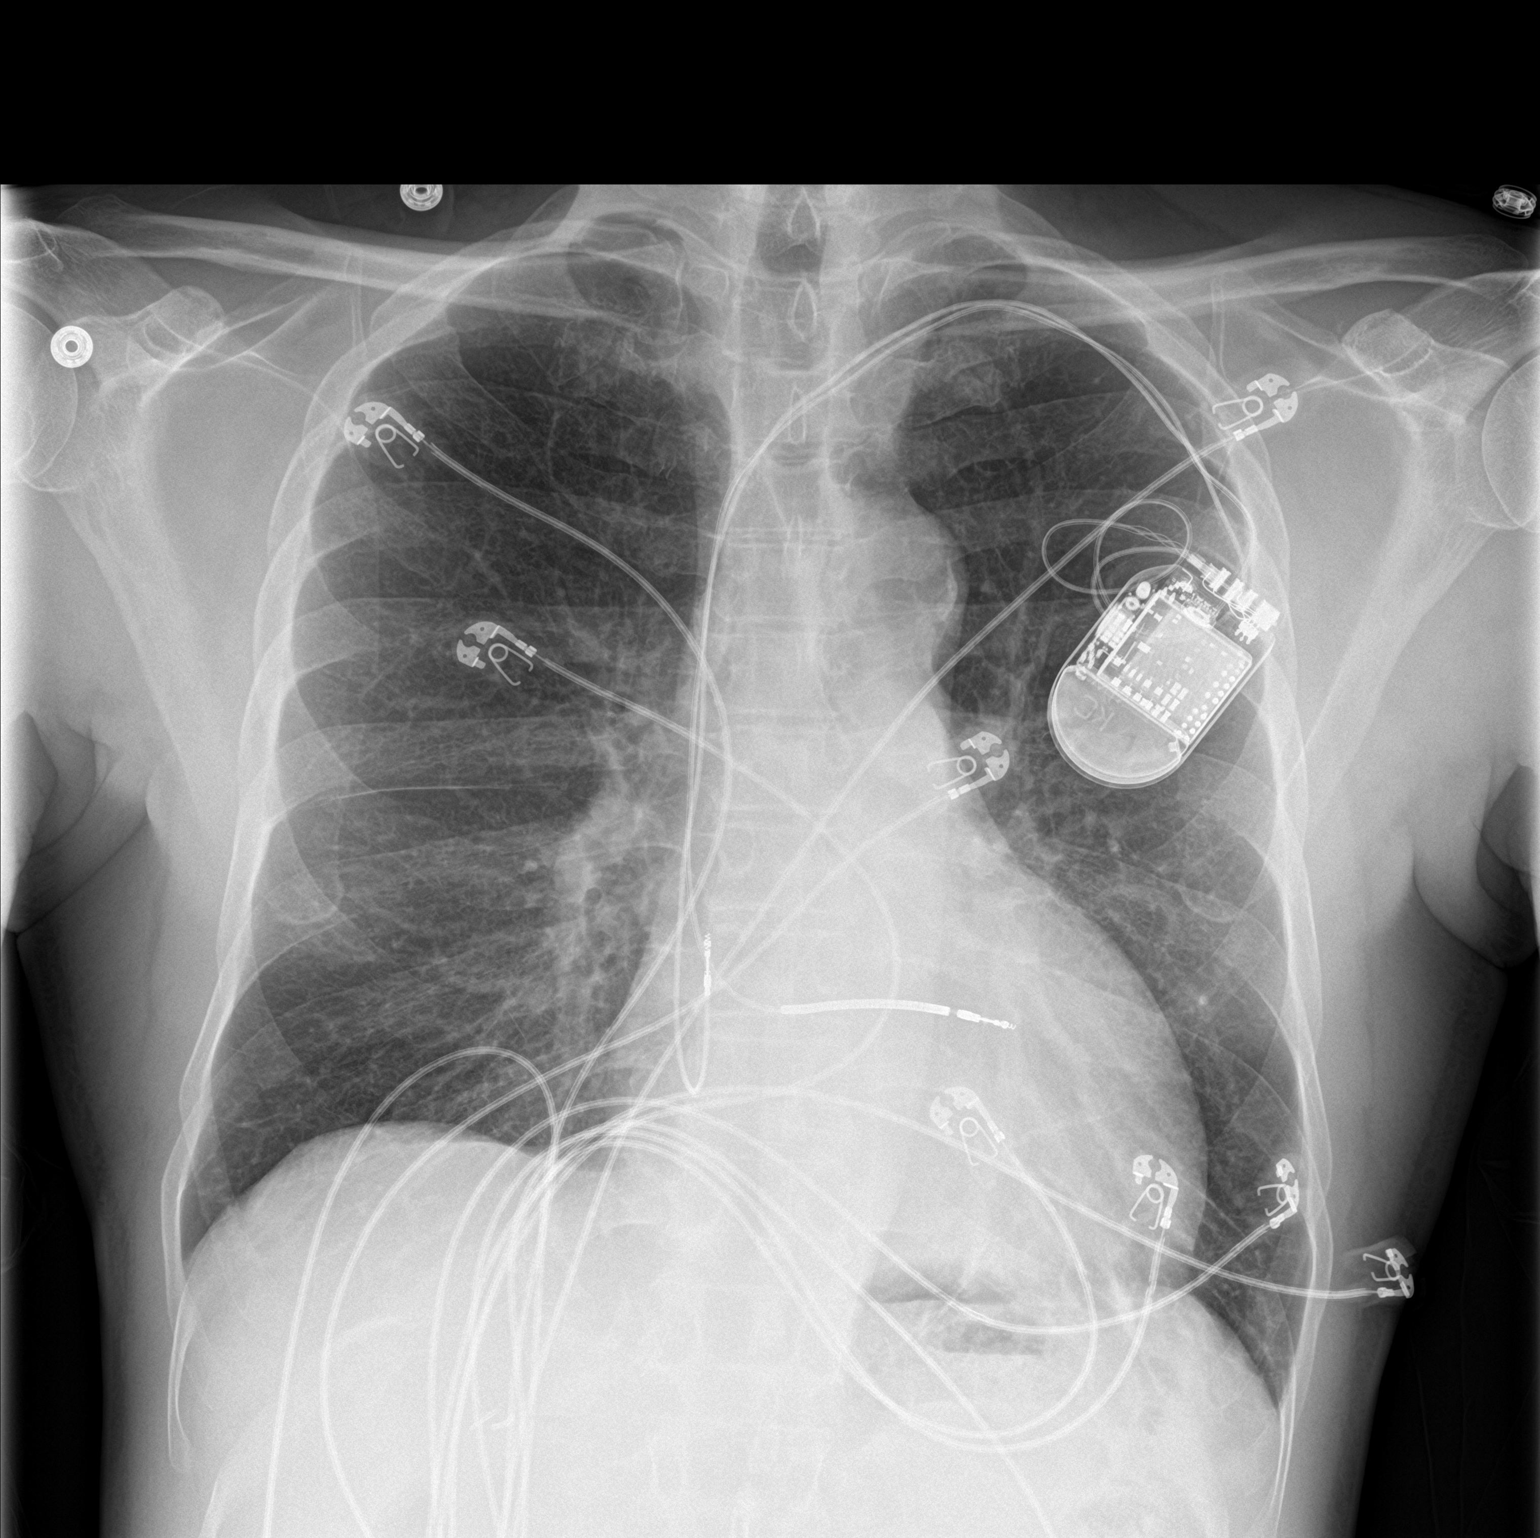

[chest lat]
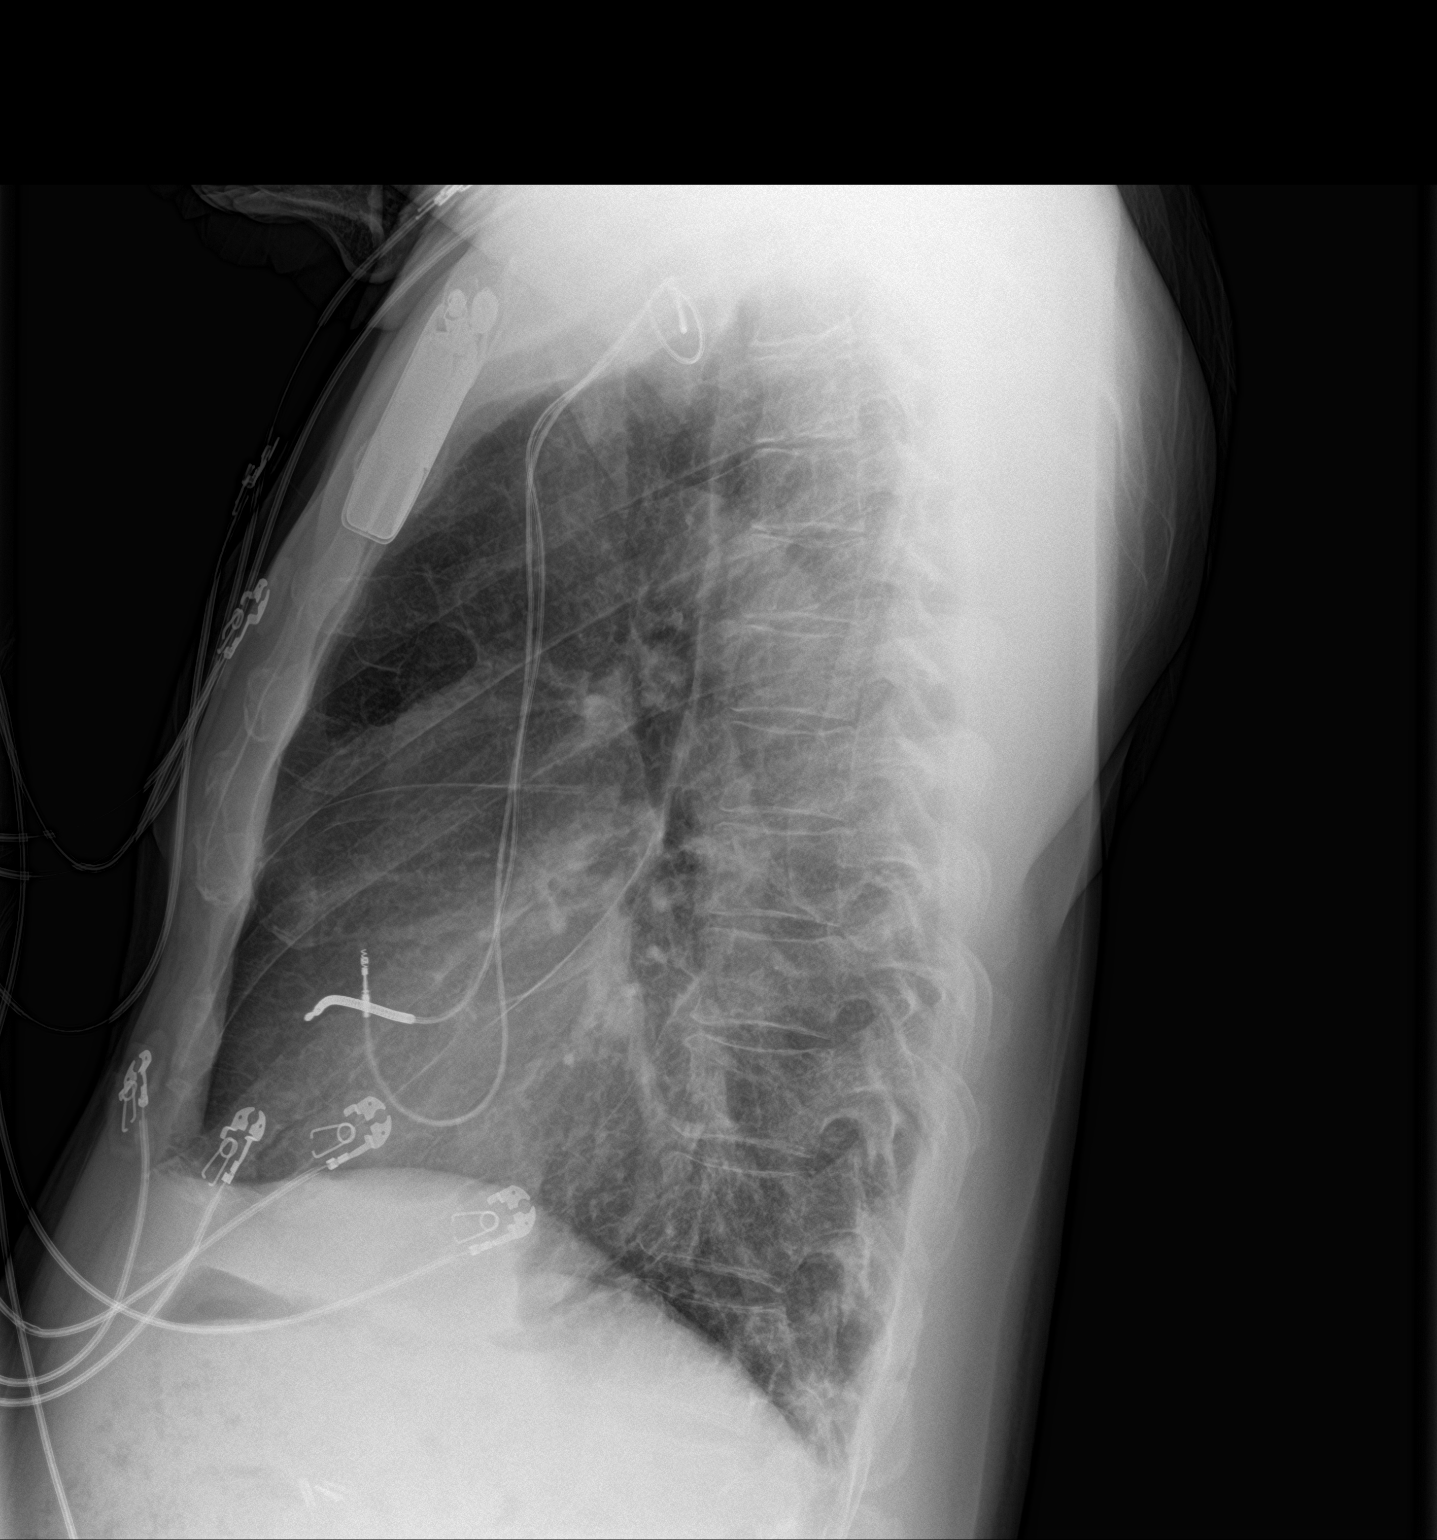

[2 of 2 positions shown; findings below may reference images not displayed]

FINDINGS: Unchanged position of left chest wall AICD leads. Mild cardiomegaly.
No focal airspace consolidation or pulmonary edema. No pleural
effusion or pneumothorax.
IMPRESSION: No active cardiopulmonary disease.

## 2018-09-04 MED ORDER — NITROGLYCERIN 0.4 MG SL SUBL
0.4000 mg | SUBLINGUAL_TABLET | SUBLINGUAL | Status: DC | PRN
  Administered 2018-09-04: 0.4 mg via SUBLINGUAL
  Filled 2018-09-04: qty 1

## 2018-09-04 MED ORDER — ONDANSETRON HCL 4 MG/2ML IJ SOLN: 4.0000 mg | Freq: Four times a day (QID) | INTRAMUSCULAR | Status: DC | PRN

## 2018-09-04 MED ORDER — NITROGLYCERIN 0.4 MG SL SUBL
0.4000 mg | SUBLINGUAL_TABLET | SUBLINGUAL | Status: DC | PRN
  Filled 2018-09-04: qty 1

## 2018-09-04 MED ORDER — INSULIN ASPART 100 UNIT/ML ~~LOC~~ SOLN
0.0000 [IU] | Freq: Three times a day (TID) | SUBCUTANEOUS | Status: DC
  Administered 2018-09-05: 3 [IU] via SUBCUTANEOUS
  Administered 2018-09-05: 1 [IU] via SUBCUTANEOUS

## 2018-09-04 MED ORDER — NITROGLYCERIN 0.4 MG/SPRAY TL SOLN: 1.0000 | Status: DC | PRN

## 2018-09-04 MED ORDER — ACETAMINOPHEN 325 MG PO TABS: 650.0000 mg | ORAL_TABLET | ORAL | Status: DC | PRN

## 2018-09-04 MED ORDER — ALUM & MAG HYDROXIDE-SIMETH 200-200-20 MG/5ML PO SUSP
30.0000 mL | Freq: Once | ORAL | Status: AC
  Administered 2018-09-04: 30 mL via ORAL
  Filled 2018-09-04: qty 30

## 2018-09-04 MED ORDER — ASPIRIN 81 MG PO CHEW
324.0000 mg | CHEWABLE_TABLET | Freq: Once | ORAL | Status: AC
  Administered 2018-09-04: 324 mg via ORAL
  Filled 2018-09-04: qty 4

## 2018-09-04 MED ORDER — LIDOCAINE VISCOUS HCL 2 % MT SOLN
15.0000 mL | Freq: Once | OROMUCOSAL | Status: AC
  Administered 2018-09-04: 15 mL via ORAL
  Filled 2018-09-04: qty 15

## 2018-09-04 MED ORDER — FENTANYL CITRATE (PF) 100 MCG/2ML IJ SOLN
50.0000 ug | Freq: Once | INTRAMUSCULAR | Status: DC
  Filled 2018-09-04: qty 2

## 2018-09-04 MED ORDER — MORPHINE SULFATE (PF) 4 MG/ML IV SOLN
4.0000 mg | Freq: Once | INTRAVENOUS | Status: AC
  Administered 2018-09-04: 2 mg via INTRAVENOUS
  Filled 2018-09-04: qty 1

## 2018-09-04 MED ORDER — FENTANYL CITRATE (PF) 100 MCG/2ML IJ SOLN
50.0000 ug | Freq: Once | INTRAMUSCULAR | Status: AC
  Administered 2018-09-04: 50 ug via INTRAVENOUS
  Filled 2018-09-04: qty 2

## 2018-09-04 NOTE — ED Provider Notes (Signed)
[facility] EMERGENCY DEPARTMENT Provider Note   CSN: [id number] Arrival date & time: 09/04/18  2038     History   Chief Complaint Chief Complaint  Patient presents with  . Chest Pain    HPI Dennis [name]. is a 57 y.o. male.  The history is provided by the patient. No language interpreter was used.  Chest Pain   This is a new problem. The current episode started 12 to 24 hours ago. The problem occurs constantly. The problem has been gradually improving. Associated with: started when he woke up from sleep. The pain is present in the epigastric region. The pain is moderate. The quality of the pain is described as burning. The pain radiates to the precordial region. Associated symptoms include cough. Pertinent negatives include no abdominal pain, no back pain, no claudication, no diaphoresis, no dizziness, no exertional chest pressure, no fever, no headaches, no irregular heartbeat, no leg pain, no lower extremity edema, no malaise/fatigue, no near-syncope, no numbness, no orthopnea, no palpitations, no shortness of breath, no sputum production, no syncope, no vomiting and no weakness. He has tried antacids for the symptoms. The treatment provided moderate relief. Risk factors include male gender.  His past medical history is significant for CHF, diabetes and hyperlipidemia.  Pertinent negatives for past medical history include no seizures.    Past Medical History:  Diagnosis Date  . Automatic implantable cardioverter-defibrillator in situ   . CHF (congestive heart failure) ([place])   . Chronic systolic heart failure (HCC)    a) Mixed ICM/NICM b) RHC (05/2014): RA 2, RV 19/2/3, PA 22/14 (18), PCWP 6, Fick CO/CI: 5.2 / 2.7, PVR 2.3 WU, PA 60% and 64% c) ECHO (05/2014): EF 20-25%, diff HK, akinesis entireanteroseptal myocardium, triv AI, mod MR, LA mod/sev dilated  . Drug abuse and dependence ([place])   . Heart murmur   . High cholesterol   . Ischemic cardiomyopathy    a)  Coronary angiography (12/2008) at [facility]: Lmain: nl, LAD mid 100% stenosis with left to left and right-to-left collaterals to the distal LAD; Lcx: nl, RCA nl.    . 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[place])   . Pneumonia 1988  . Sleep apnea    "cleared after T&A"  . Type II diabetes mellitus [facility])     Patient Active Problem List   Diagnosis Date Noted  . Cocaine abuse with cocaine-induced mood disorder ([place]) 08/20/2015  . Alcohol abuse with alcohol-induced mood disorder ([place]) 08/18/2015  . MDD (major depressive disorder), recurrent severe, without psychosis ([place]) 08/18/2015  . Substance induced mood disorder ([place]) 08/17/2015  . Psychoactive substance-induced mood disorder ([place]) 07/17/2015  . Open wound of foot 02/27/2015  . HLD (hyperlipidemia)   . Status post PICC central line placement   . History of ischemic right MCA stroke   . Type 2 diabetes mellitus with complication ([place])   . Stroke ([place])   . Stroke with cerebral ischemia ([place])   . CVA (cerebral infarction) 01/26/2015  . Left-sided weakness   . Embolic stroke involving right middle cerebral artery ([place])   . Cardiac LV ejection fraction 10-20%   . 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[place])   . Cocaine abuse ([place]) 01/24/2015  . H/O noncompliance with medical treatment, presenting hazards to health 01/24/2015  . Tobacco abuse 01/24/2015  . Essential hypertension 01/24/2015  . Hypotension 01/24/2015  . Hyperlipidemia 01/24/2015  . Dysphagia following cerebral infarction 01/24/2015  . Hemiplegia affecting left nondominant side ([place]) 01/24/2015  . Diabetes mellitus type 2,  uncontrolled, with complications ([place]) 88/50/2774  . Hyperlipidemia associated with type 2 diabetes mellitus ([place]) 11/30/2014  . CAD (coronary artery disease) 11/16/2014  . Chest pain on breathing 09/21/2014  . Chronic systolic heart failure ([place]) 06/20/2014  . Automatic implantable cardioverter-defibrillator in situ   . 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[place])  05/27/2014  . Dyspnea 05/26/2014  . Chest discomfort 08/09/2013  . Long term (current) use of anticoagulants 08/09/2013  . Drug abuse and dependence ([place])   . CHF (congestive heart failure) ([place])   . BP (high blood pressure) 03/04/2013  . Cardiomyopathy, ischemic 07/21/2011  . Type II diabetes mellitus with ophthalmic manifestations ([place]) 09/13/2007  . WEIGHT LOSS, ABNORMAL 09/13/2007  . SOB 09/13/2007    Past Surgical History:  Procedure Laterality Date  . CARDIAC CATHETERIZATION  05/2014  . CARDIAC DEFIBRILLATOR PLACEMENT  03/2011  . CHOLECYSTECTOMY  1994  . FOREARM FRACTURE SURGERY Left 1980  . FRACTURE SURGERY    . RIGHT HEART CATHETERIZATION N/A 05/30/2014   Procedure: RIGHT HEART CATH;  Surgeon: Jolaine Artist, MD;  Location: [facility] CATH LAB;  Service: Cardiovascular;  Laterality: N/A;  . RIGHT HEART CATHETERIZATION N/A 02/07/2015   Procedure: RIGHT HEART CATH;  Surgeon: Jolaine Artist, MD;  Location: [facility] CATH LAB;  Service: Cardiovascular;  Laterality: N/A;  . TONSILLECTOMY AND ADENOIDECTOMY  ~ 1998        Home Medications    Prior to Admission medications   Medication Sig Start Date End Date Taking? Authorizing Provider  atorvastatin (LIPITOR) 20 MG tablet TAKE 1 TABLET BY MOUTH DAILY AT 6 PM. 03/11/17   Bensimhon, Shaune Pascal, MD  carvedilol (COREG) 6.25 MG tablet TAKE 1 TABLET BY MOUTH 2 TIMES DAILY WITH A MEAL. 06/24/17   Bensimhon, Shaune Pascal, MD  CORLANOR 7.5 MG TABS tablet TAKE 1 TABLET BY MOUTH 2 TIMES DAILY WITH A MEAL. 08/13/17   Bensimhon, Shaune Pascal, MD  furosemide (LASIX) 40 MG tablet Take 1 tablet (40 mg total) by mouth as needed. 05/31/18   Clegg, Amy D, NP  glucose blood (ONETOUCH VERIO) test strip Use as instructed 11/30/14   [place], FNP  Insulin Glargine (LANTUS SOLOSTAR) 100 UNIT/ML Solostar Pen Inject 10 Units into the skin daily at 10 pm.     [provider]  nicotine (NICODERM CQ - DOSED IN MG/24 HOURS) 21 mg/24hr patch Place 1 patch (21 mg  total) onto the skin daily. CALL CHF CLINIC WHEN YOU RUN OUT TO GET 14 MG DOSE PATCHES SENT. 10/16/16   Clegg, Amy D, NP  QC PEN NEEDLES 31G X 6 MM MISC 1 each by Other route See admin instructions. Reported on 12/18/2015 08/06/12   [provider]  sacubitril-valsartan (ENTRESTO) 97-103 MG Take 1 tablet by mouth 2 (two) times daily. 08/12/17   Bensimhon, Shaune Pascal, MD  spironolactone (ALDACTONE) 25 MG tablet TAKE 1/2 TABLET BY MOUTH DAILY. 01/25/18   Larey Dresser, MD  tadalafil (CIALIS) 20 MG tablet Take 1 tablet (20 mg total) by mouth daily as needed for erectile dysfunction. 08/26/16   Shirley Friar, PA-C  warfarin (COUMADIN) 7.5 MG tablet TAKE BY MOUTH AS DIRECTED BY COUMADIN CLINIC 06/24/17   Bensimhon, Shaune Pascal, MD    Family History Family History  Problem Relation Age of Onset  . Diabetes Mother   . Heart disease Mother   . Diabetes Father   . Prostate cancer Father   . Heart disease Father   . Diabetes Brother   . Diabetes  Brother     Social History Social History   Tobacco Use  . Smoking status: Current Some Day Smoker    Packs/day: 0.25    Years: 31.00    Pack years: 7.75    Types: Cigarettes  . Smokeless tobacco: Never Used  Substance Use Topics  . Alcohol use: Yes    Alcohol/week: 0.0 standard drinks    Comment: 09/21/2014 "might have a drink q 6 /months, if that"  . Drug use: Yes    Types: Cocaine    Comment: 09/21/2014 "last cocaine ~ 1 1/2 months ago"     Allergies   Patient has no known allergies.   Review of Systems Review of Systems  Constitutional: Negative for chills, diaphoresis, fever and malaise/fatigue.  HENT: Negative for ear pain and sore throat.   Eyes: Negative for pain and visual disturbance.  Respiratory: Positive for cough. Negative for sputum production and shortness of breath.   Cardiovascular: Positive for chest pain. Negative for palpitations, orthopnea, claudication, syncope and near-syncope.  Gastrointestinal:  Negative for abdominal pain and vomiting.  Genitourinary: Negative for dysuria and hematuria.  Musculoskeletal: Negative for arthralgias and back pain.  Skin: Negative for color change and rash.  Neurological: Negative for dizziness, seizures, syncope, weakness, numbness and headaches.  All other systems reviewed and are negative.    Physical Exam Updated Vital Signs BP 119/88 (BP Location: Right Arm)   Pulse 94   Temp 98.5 F (36.9 C) (Oral)   Resp 15   Ht 6' (1.829 m)   Wt 77.1 kg   SpO2 98%   BMI 23.06 kg/m   Physical Exam  Constitutional: He appears well-developed and well-nourished.  HENT:  Head: Normocephalic and atraumatic.  Eyes: Conjunctivae are normal.  Neck: Neck supple.  Cardiovascular: Normal rate, regular rhythm and normal pulses.  No murmur heard. Pulmonary/Chest: Effort normal and breath sounds normal. No respiratory distress. He has no decreased breath sounds.  Abdominal: Soft. There is no tenderness.  Musculoskeletal: He exhibits no edema.       Right lower leg: Normal.       Left lower leg: Normal.  Neurological: He is alert.  Skin: Skin is warm and dry.  Psychiatric: He has a normal mood and affect.  Nursing note and vitals reviewed.  ED Treatments / Results  Labs (all labs ordered are listed, but only abnormal results are displayed) Labs Reviewed  CBC - Abnormal; Notable for the following components:      Result Value   Hemoglobin 12.9 (*)    MCHC 29.3 (*)    All other components within normal limits  PROTIME-INR - Abnormal; Notable for the following components:   Prothrombin Time 19.8 (*)    All other components within normal limits  BASIC METABOLIC PANEL  BRAIN NATRIURETIC PEPTIDE  I-STAT TROPONIN, ED    EKG None  Radiology No results found.  Procedures Procedures (including critical care time)  Medications Ordered in ED Medications  alum & mag hydroxide-simeth (MAALOX/MYLANTA) 200-200-20 MG/5ML suspension 30 mL (has no  administration in time range)    And  lidocaine (XYLOCAINE) 2 % viscous mouth solution 15 mL (has no administration in time range)     Initial Impression / Assessment and Plan / ED Course  I have reviewed the triage vital signs and the nursing notes.  Pertinent labs & imaging results that were available during my care of the patient were reviewed by me and considered in my medical decision making (see chart for details).  Patient is a 57 y.o. male with PMHx of CHF, ICD in place presenting for chest pain for 1 day that started this morning, burning in nature. EKG was reviewed by myself and my attending physician - normal sinus rhythm, no new ST segment elevations or T wave inversions.  CXR was reviewed by myself and is unchanged from previous.  Labs remarkable for INR of 1.7, BNP mildly elevated, creatinine at baseline, hyperglycemic with no anion gap.  ASA given.  Delta troponin negative. Viscous lidocaine and maalox given, which did not help symptoms.  Patient is very high risk as he has previous cardiac catherization at [facility] that showed 100% LAD lesion that was unable to be stented.  Patient given nitroglycerin and fentanyl for pain. Patient is now has decreased pain.  2mg  of morphine was given and chest pain free.  Patient will be admitted for further observation and management of high risk chest pain.    Final Clinical Impressions(s) / ED Diagnoses   Final diagnoses:  Chest pain, unspecified type    ED Discharge Orders    None       Erskine Squibb, MD 09/04/18 [place], [place], MD 09/06/18 [phone]

## 2018-09-04 NOTE — ED Triage Notes (Signed)
C/O of chest pain that started this morning. Pt describes it as a heavy pressure in the center of his chest. Pt states he does have a [employer] defibrillator.

## 2018-09-05 ENCOUNTER — Encounter (HOSPITAL_COMMUNITY): Payer: Self-pay

## 2018-09-05 ENCOUNTER — Other Ambulatory Visit: Payer: Self-pay

## 2018-09-05 ENCOUNTER — Observation Stay (HOSPITAL_BASED_OUTPATIENT_CLINIC_OR_DEPARTMENT_OTHER): Payer: Medicare Other

## 2018-09-05 ENCOUNTER — Other Ambulatory Visit: Payer: Self-pay | Admitting: Physician Assistant

## 2018-09-05 DIAGNOSIS — I370 Nonrheumatic pulmonary valve stenosis: Secondary | ICD-10-CM

## 2018-09-05 DIAGNOSIS — I25110 Atherosclerotic heart disease of native coronary artery with unstable angina pectoris: Secondary | ICD-10-CM

## 2018-09-05 DIAGNOSIS — I340 Nonrheumatic mitral (valve) insufficiency: Secondary | ICD-10-CM | POA: Diagnosis not present

## 2018-09-05 DIAGNOSIS — I2510 Atherosclerotic heart disease of native coronary artery without angina pectoris: Secondary | ICD-10-CM

## 2018-09-05 DIAGNOSIS — R0789 Other chest pain: Secondary | ICD-10-CM | POA: Diagnosis not present

## 2018-09-05 DIAGNOSIS — I10 Essential (primary) hypertension: Secondary | ICD-10-CM

## 2018-09-05 DIAGNOSIS — I5022 Chronic systolic (congestive) heart failure: Secondary | ICD-10-CM

## 2018-09-05 DIAGNOSIS — Z95810 Presence of automatic (implantable) cardiac defibrillator: Secondary | ICD-10-CM | POA: Diagnosis not present

## 2018-09-05 LAB — GLUCOSE, CAPILLARY
Glucose-Capillary: 134 mg/dL — ABNORMAL HIGH (ref 70–99)
Glucose-Capillary: 208 mg/dL — ABNORMAL HIGH (ref 70–99)
Glucose-Capillary: 215 mg/dL — ABNORMAL HIGH (ref 70–99)

## 2018-09-05 LAB — TROPONIN I
Troponin I: <0.03 ng/mL (ref <0.03)
Troponin I: <0.03 ng/mL (ref <0.03)
Troponin I: <0.03 ng/mL (ref <0.03)

## 2018-09-05 LAB — ECHOCARDIOGRAM COMPLETE
Height: 72 in
Weight: 2747.81 oz

## 2018-09-05 MED ORDER — SACUBITRIL-VALSARTAN 97-103 MG PO TABS
1.0000 | ORAL_TABLET | Freq: Two times a day (BID) | ORAL | Status: DC
  Administered 2018-09-05: 1 via ORAL
  Filled 2018-09-05: qty 1

## 2018-09-05 MED ORDER — FUROSEMIDE 40 MG PO TABS: 40.0000 mg | ORAL_TABLET | ORAL | 0 refills | Status: DC

## 2018-09-05 MED ORDER — SPIRONOLACTONE 12.5 MG HALF TABLET
12.5000 mg | ORAL_TABLET | Freq: Every day | ORAL | Status: DC
  Administered 2018-09-05: 12.5 mg via ORAL
  Filled 2018-09-05: qty 1

## 2018-09-05 MED ORDER — FUROSEMIDE 10 MG/ML IJ SOLN
40.0000 mg | Freq: Once | INTRAMUSCULAR | Status: AC
  Administered 2018-09-05: 40 mg via INTRAVENOUS
  Filled 2018-09-05: qty 4

## 2018-09-05 MED ORDER — CARVEDILOL 6.25 MG PO TABS: 6.2500 mg | ORAL_TABLET | Freq: Two times a day (BID) | ORAL | Status: DC

## 2018-09-05 NOTE — Progress Notes (Signed)
  Echocardiogram 2D Echocardiogram has been performed.  Dennis [name] 09/05/2018, 10:15 AM

## 2018-09-05 NOTE — Plan of Care (Signed)
Will cont to mon

## 2018-09-05 NOTE — Progress Notes (Signed)
Echo report finalized - suggests LVEF is lower at 20-25%. More likely his symptoms are acute on chronic systolic CHF. Plan lasix, outpatient ischemia work-up and CHF clinic follow-up.  Pixie Casino, MD, [facility], [place] Director of the Advanced Lipid Disorders &  Cardiovascular Risk Reduction Clinic Diplomate of the American Board of Clinical Lipidology Attending Cardiologist  Direct Dial: [phone]  Fax: [phone]  Website:  [email or web address]

## 2018-09-05 NOTE — H&P (Signed)
History and Physical    Dennis [name]. NGE:[id number] DOB: [birth year] DOA: 09/04/2018  PCP: Golden Circle, FNP  Patient coming from: home  Chief Complaint:  Chest pain  HPI: Dennis [name]. is a 57 y.o. male with medical history significant of CAD, AICD, chf ef in past of 10%, drug abuse and etoh abuse in remission for almost 3 years, comes in with sscp and pressure that occurred this am when he awoke associated with sob no n/v no fevers no cough.  No radiation of pain.  Pain present for hours PTA give gi meds in ed with no relieve then given ntg and opiates with total relief of pain.  No le edema or swelling.  Has no h/o angina ever in the past.  He believes last cath was about a year ago.  Pt referred for admission for his chest pain.  Review of Systems: As per HPI otherwise 10 point review of systems negative.   Past Medical History:  Diagnosis Date  . Automatic implantable cardioverter-defibrillator in situ   . CHF (congestive heart failure) ([place])   . Chronic systolic heart failure (HCC)    a) Mixed ICM/NICM b) RHC (05/2014): RA 2, RV 19/2/3, PA 22/14 (18), PCWP 6, Fick CO/CI: 5.2 / 2.7, PVR 2.3 WU, PA 60% and 64% c) ECHO (05/2014): EF 20-25%, diff HK, akinesis entireanteroseptal myocardium, triv AI, mod MR, LA mod/sev dilated  . Drug abuse and dependence ([place])   . Heart murmur   . High cholesterol   . Ischemic cardiomyopathy    a) Coronary angiography (12/2008) at [facility]: Lmain: nl, LAD mid 100% stenosis with left to left and right-to-left collaterals to the distal LAD; Lcx: nl, RCA nl.    . 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[place])   . Pneumonia 1988  . Sleep apnea    "cleared after T&A"  . Type II diabetes mellitus ([place])     Past Surgical History:  Procedure Laterality Date  . CARDIAC CATHETERIZATION  05/2014  . CARDIAC DEFIBRILLATOR PLACEMENT  03/2011  . CHOLECYSTECTOMY  1994  . FOREARM FRACTURE SURGERY Left 1980  . FRACTURE SURGERY    . RIGHT HEART CATHETERIZATION N/A  05/30/2014   Procedure: RIGHT HEART CATH;  Surgeon: Jolaine Artist, MD;  Location: [facility] CATH LAB;  Service: Cardiovascular;  Laterality: N/A;  . RIGHT HEART CATHETERIZATION N/A 02/07/2015   Procedure: RIGHT HEART CATH;  Surgeon: Jolaine Artist, MD;  Location: [facility] CATH LAB;  Service: Cardiovascular;  Laterality: N/A;  . TONSILLECTOMY AND ADENOIDECTOMY  ~ 1998     reports that he has been smoking cigarettes. He has a 7.75 pack-year smoking history. He has never used smokeless tobacco. He reports that he drinks alcohol. He reports that he has current or past drug history. Drug: Cocaine.  No Known Allergies  Family History  Problem Relation Age of Onset  . Diabetes Mother   . Heart disease Mother   . Diabetes Father   . Prostate cancer Father   . Heart disease Father   . Diabetes Brother   . Diabetes Brother     Prior to Admission medications   Medication Sig Start Date End Date Taking? Authorizing Provider  atorvastatin (LIPITOR) 20 MG tablet TAKE 1 TABLET BY MOUTH DAILY AT 6 PM. Patient taking differently: Take 20 mg by mouth daily.  03/11/17  Yes Bensimhon, Shaune Pascal, MD  carvedilol (COREG) 6.25 MG tablet TAKE 1 TABLET BY MOUTH 2 TIMES DAILY WITH A MEAL. Patient taking differently: Take 6.25  mg by mouth 2 (two) times daily with a meal.  06/24/17  Yes Bensimhon, Shaune Pascal, MD  furosemide (LASIX) 40 MG tablet Take 1 tablet (40 mg total) by mouth as needed. Patient taking differently: Take 40 mg by mouth daily as needed for fluid.  05/31/18  Yes Clegg, Amy D, NP  glucose blood (ONETOUCH VERIO) test strip Use as instructed 11/30/14  Yes Golden Circle, FNP  Insulin Glargine (LANTUS SOLOSTAR) 100 UNIT/ML Solostar Pen Inject 32 Units into the skin daily at 10 pm.    Yes [provider]  metFORMIN (GLUCOPHAGE) 500 MG tablet Take 500 mg by mouth 2 (two) times daily with a meal.   Yes [provider]  sacubitril-valsartan (ENTRESTO) 97-103 MG Take 1 tablet by mouth 2 (two)  times daily. 08/12/17  Yes Bensimhon, Shaune Pascal, MD  spironolactone (ALDACTONE) 25 MG tablet TAKE 1/2 TABLET BY MOUTH DAILY. Patient taking differently: Take 12.5 mg by mouth daily.  01/25/18  Yes Larey Dresser, MD  tadalafil (CIALIS) 20 MG tablet Take 1 tablet (20 mg total) by mouth daily as needed for erectile dysfunction. 08/26/16  Yes Shirley Friar, PA-C  warfarin (COUMADIN) 7.5 MG tablet TAKE BY MOUTH AS DIRECTED BY COUMADIN CLINIC Patient taking differently: Take 3.75-7.5 mg by mouth See admin instructions. Take 3.75 mg on Monday, Wednesday and Friday.  All other days take 7.5 mg 06/24/17  Yes Bensimhon, Shaune Pascal, MD  CORLANOR 7.5 MG TABS tablet TAKE 1 TABLET BY MOUTH 2 TIMES DAILY WITH A MEAL. Patient not taking: Reported on 09/04/2018 08/13/17   Bensimhon, Shaune Pascal, MD  nicotine (NICODERM CQ - DOSED IN MG/24 HOURS) 21 mg/24hr patch Place 1 patch (21 mg total) onto the skin daily. CALL CHF CLINIC WHEN YOU RUN OUT TO GET 14 MG DOSE PATCHES SENT. Patient not taking: Reported on 09/04/2018 10/16/16   Darrick Grinder D, NP    Physical Exam: Vitals:   09/04/18 2327 09/04/18 2330 09/04/18 2345 09/05/18 0000  BP: 126/79 116/81 116/87 121/82  Pulse: 96 93 89 88  Resp: 18 15 16 18   Temp:      TempSrc:      SpO2: 93% 94% 93% 94%  Weight:      Height:          Constitutional: NAD, calm, comfortable Vitals:   09/04/18 2327 09/04/18 2330 09/04/18 2345 09/05/18 0000  BP: 126/79 116/81 116/87 121/82  Pulse: 96 93 89 88  Resp: 18 15 16 18   Temp:      TempSrc:      SpO2: 93% 94% 93% 94%  Weight:      Height:       Eyes: PERRL, lids and conjunctivae normal ENMT: Mucous membranes are moist. Posterior pharynx clear of any exudate or lesions.Normal dentition.  Neck: normal, supple, no masses, no thyromegaly Respiratory: clear to auscultation bilaterally, no wheezing, no crackles. Normal respiratory effort. No accessory muscle use.  Cardiovascular: Regular rate and rhythm, no murmurs /  rubs / gallops. No extremity edema. 2+ pedal pulses. No carotid bruits.  Abdomen: no tenderness, no masses palpated. No hepatosplenomegaly. Bowel sounds positive.  Musculoskeletal: no clubbing / cyanosis. No joint deformity upper and lower extremities. Good ROM, no contractures. Normal muscle tone.  Skin: no rashes, lesions, ulcers. No induration Neurologic: CN 2-12 grossly intact. Sensation intact, DTR normal. Strength 5/5 in all 4.  Psychiatric: Normal judgment and insight. Alert and oriented x 3. Normal mood.    Labs on Admission: I  have personally reviewed following labs and imaging studies  CBC: Recent Labs  Lab 09/04/18 2052  WBC 8.5  HGB 12.9*  HCT 44.1  MCV 99.5  PLT 387   Basic Metabolic Panel: Recent Labs  Lab 09/04/18 2052  NA 138  K 4.1  CL 106  CO2 26  GLUCOSE 237*  BUN 11  CREATININE 1.33*  CALCIUM 9.0   GFR: Estimated Creatinine Clearance: 67.6 mL/min (A) (by C-G formula based on SCr of 1.33 mg/dL (H)). Liver Function Tests: No results for input(s): AST, ALT, ALKPHOS, BILITOT, PROT, ALBUMIN in the last 168 hours. No results for input(s): LIPASE, AMYLASE in the last 168 hours. No results for input(s): AMMONIA in the last 168 hours. Coagulation Profile: Recent Labs  Lab 09/01/18 1439 09/04/18 2058  INR 3.9* 1.70   Cardiac Enzymes: No results for input(s): CKTOTAL, CKMB, CKMBINDEX, TROPONINI in the last 168 hours. BNP (last 3 results) No results for input(s): PROBNP in the last 8760 hours. HbA1C: No results for input(s): HGBA1C in the last 72 hours. CBG: No results for input(s): GLUCAP in the last 168 hours. Lipid Profile: No results for input(s): CHOL, HDL, LDLCALC, TRIG, CHOLHDL, LDLDIRECT in the last 72 hours. Thyroid Function Tests: No results for input(s): TSH, T4TOTAL, FREET4, T3FREE, THYROIDAB in the last 72 hours. Anemia Panel: No results for input(s): VITAMINB12, FOLATE, FERRITIN, TIBC, IRON, RETICCTPCT in the last 72 hours. Urine  analysis:    Component Value Date/Time   COLORURINE YELLOW 07/16/2015 [place] 07/16/2015 1715   LABSPEC 1.040 (H) 07/16/2015 1715   PHURINE 5.0 07/16/2015 1715   GLUCOSEU >1000 (A) 07/16/2015 1715   HGBUR TRACE (A) 07/16/2015 1715   BILIRUBINUR NEGATIVE 07/16/2015 1715   KETONESUR 15 (A) 07/16/2015 1715   PROTEINUR NEGATIVE 07/16/2015 1715   UROBILINOGEN 1.0 07/16/2015 1715   NITRITE NEGATIVE 07/16/2015 1715   LEUKOCYTESUR NEGATIVE 07/16/2015 1715   Sepsis Labs: !!!!!!!!!!!!!!!!!!!!!!!!!!!!!!!!!!!!!!!!!!!! @LABRCNTIP (procalcitonin:4,lacticidven:4) )No results found for this or any previous visit (from the past 240 hour(s)).   Radiological Exams on Admission: Dg Chest 2 View  Result Date: 09/04/2018 CLINICAL DATA:  Chest pain EXAM: CHEST - 2 VIEW COMPARISON:  06/08/2017 FINDINGS: Unchanged position of left chest wall AICD leads. Mild cardiomegaly. No focal airspace consolidation or pulmonary edema. No pleural effusion or pneumothorax. IMPRESSION: No active cardiopulmonary disease. Electronically Signed   By: Ulyses Jarred M.D.   On: 09/04/2018 22:45    EKG: Independently reviewed.  nsr no acute issues cxr reviewed no edema or infiltrate Old chart reviewed Case discussed with dr Charlesetta Ivory in the ed   Assessment/Plan 57 yo male with multiple cardiac issues comes in with atypical chest pain  Principal Problem:   Chest discomfort- serial trop.  Cardiac echo in the am.  Cp free at this time.  Obs on tele.  ekg nonischemic  Active Problems:   Automatic implantable cardioverter-defibrillator in situ- noted    Chronic systolic heart failure ([place])- stable and compensated     CAD (coronary artery disease)- stable    Cocaine abuse ([place])- remission for 3 years    Essential hypertension- stable, resume home meds    Cardiac LV ejection fraction 10-20%- ck cardiac echo in the am    Alcohol abuse with alcohol-induced mood disorder ([place])- remission for 3  years     DVT prophylaxis: scds Code Status: full Family Communication: girlfriend Disposition Plan:  Later today Consults called:  none Admission status:  observation   DAVID,RACHAL A MD [facility]  If 7PM-7AM, please contact night-coverage [email or web address] Password [facility]  09/05/2018, 12:18 AM

## 2018-09-05 NOTE — Consult Note (Addendum)
Cardiology Consultation:   Patient ID: Dennis [name]. MRN: [id number]; DOB: 14-Sep-1961  Admit date: 09/04/2018 Date of Consult: 09/05/2018  Primary Care Provider: Golden Circle, FNP Primary Cardiologist/CHF: Dr. Haroldine Laws Primary Electrophysiologist:  Dr. Lovena Le   Patient Profile:   Dennis [name]. is a 57 y.o. male with a hx of DM2, HLD, CAD, CVA, prior polysubstance use (tobacco, cocaine) last sused 2017, mixed ICM/NICM and chronic systolic HF due to ventricular noncompactions s/p Dual chamber ICD and on chronic coumadin who is being seen today for the evaluation of chest pain at the request of Dr. Eliseo Squires.   Hx of CAD with coronary angiography (12/2008) at [facility]: Lmain: nl, LAD mid 100% stenosis with left to left and right-to-left collaterals to the distal LAD; Lcx: nl, RCA nl.    Admitted to [facility] in 4/16 with acute CVA and with L sided weakness. CT showed R MCA stroke. Did not receive TPA due to delay in arrival. Had ECHO that showed EF 10-15% mild RV HK. No obvious LV clot. As he improved he was transferred to CIR but then developed hypotension and transferred back to HF ward. Underwent RHC on 4/27. Filling pressures mildly elevated CO ok. Diuresed gently.  Last echo 08/12/17 showed LVEF 35-40%, much improved from previous. He was doing well when last seen in CHF clinic 05/2018.    History of Present Illness:   [name] was in usual state of health up until yesterday morning when he woke up with a severe 10 out of 10 substernal chest pressure with shortness of breath.  Symptoms are lasted all day along and came to ER in the evening.  He tried Tums and ginger ale prior to presentation without any improvement on symptoms as he thought this might be due to acid reflux.  No prior history of GERD.  He was also given GI cocktail without significant improvement of his symptoms in ER.  However pain eventually eased off after sublingual nitroglycerin x1 and pain medication in ER.  He is chest  pain-free since admit.  He says he never required sublingual nitroglycerin previously.  He used to go [employer] few days a week without any limiting symptoms.  Currently enrolled in college and doing business degree, so  unable to do exercise in past 3 weeks.  Denies cocaine/marijuana relapse.  He has dry hacking cough for past 3 to 4 weeks.  He endorses intermittent orthopnea and dyspnea and takes Lasix usually 2 days/week.  Patient denies palpitation, PND, syncope, lower extremity edema, melena or blood in his stool or urine.  Past Medical History:  Diagnosis Date  . Automatic implantable cardioverter-defibrillator in situ   . CHF (congestive heart failure) ([place])   . Chronic systolic heart failure (HCC)    a) Mixed ICM/NICM b) RHC (05/2014): RA 2, RV 19/2/3, PA 22/14 (18), PCWP 6, Fick CO/CI: 5.2 / 2.7, PVR 2.3 WU, PA 60% and 64% c) ECHO (05/2014): EF 20-25%, diff HK, akinesis entireanteroseptal myocardium, triv AI, mod MR, LA mod/sev dilated  . Drug abuse and dependence ([place])   . Heart murmur   . High cholesterol   . Ischemic cardiomyopathy    a) Coronary angiography (12/2008) at [facility]: Lmain: nl, LAD mid 100% stenosis with left to left and right-to-left collaterals to the distal LAD; Lcx: nl, RCA nl.    . 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[place])   . Pneumonia 1988  . Sleep apnea    "cleared after T&A"  . Type II diabetes mellitus ([place])  Past Surgical History:  Procedure Laterality Date  . CARDIAC CATHETERIZATION  05/2014  . CARDIAC DEFIBRILLATOR PLACEMENT  03/2011  . CHOLECYSTECTOMY  1994  . FOREARM FRACTURE SURGERY Left 1980  . FRACTURE SURGERY    . RIGHT HEART CATHETERIZATION N/A 05/30/2014   Procedure: RIGHT HEART CATH;  Surgeon: Jolaine Artist, MD;  Location: [facility] CATH LAB;  Service: Cardiovascular;  Laterality: N/A;  . RIGHT HEART CATHETERIZATION N/A 02/07/2015   Procedure: RIGHT HEART CATH;  Surgeon: Jolaine Artist, MD;  Location: [facility] CATH LAB;  Service: Cardiovascular;  Laterality:  N/A;  . TONSILLECTOMY AND ADENOIDECTOMY  ~ 1998     Inpatient Medications: Scheduled Meds: . insulin aspart  0-9 Units Subcutaneous TID WC   Continuous Infusions:  PRN Meds: acetaminophen, ondansetron (ZOFRAN) IV  Allergies:   No Known Allergies  Social History:   Social History   Socioeconomic History  . Marital status: Single    Spouse name: Not on file  . Number of children: 1  . Years of education: 24  . Highest education level: Not on file  Occupational History  . Occupation: Disabled  Social Needs  . Financial resource strain: Not on file  . Food insecurity:    Worry: Not on file    Inability: Not on file  . Transportation needs:    Medical: Not on file    Non-medical: Not on file  Tobacco Use  . Smoking status: Current Some Day Smoker    Packs/day: 0.25    Years: 31.00    Pack years: 7.75    Types: Cigarettes  . Smokeless tobacco: Never Used  Substance and Sexual Activity  . Alcohol use: Yes    Alcohol/week: 0.0 standard drinks    Comment: 09/21/2014 "might have a drink q 6 /months, if that"  . Drug use: Yes    Types: Cocaine    Comment: 09/21/2014 "last cocaine ~ 1 1/2 months ago"  . Sexual activity: Yes  Lifestyle  . Physical activity:    Days per week: Not on file    Minutes per session: Not on file  . Stress: Not on file  Relationships  . Social connections:    Talks on phone: Not on file    Gets together: Not on file    Attends religious service: Not on file    Active member of club or organization: Not on file    Attends meetings of clubs or organizations: Not on file    Relationship status: Not on file  . Intimate partner violence:    Fear of current or ex partner: Not on file    Emotionally abused: Not on file    Physically abused: Not on file    Forced sexual activity: Not on file  Other Topics Concern  . Not on file  Social History Narrative   Lives in [place] with his mother. No pets. Fun: movies, sports, daughter.    Denies  religious beliefs that would effect health care.     Family History:   Family History  Problem Relation Age of Onset  . Diabetes Mother   . Heart disease Mother   . Diabetes Father   . Prostate cancer Father   . Heart disease Father   . Diabetes Brother   . Diabetes Brother      ROS:  Please see the history of present illness.  All other ROS reviewed and negative.     Physical Exam/Data:   Vitals:   09/04/18 2345 09/05/18 0000  09/05/18 0045 09/05/18 0648  BP: 116/87 121/82 123/87 (!) 132/94  Pulse: 89 88 89 87  Resp: 16 18 16 16   Temp:   97.9 F (36.6 C) (!) 97.5 F (36.4 C)  TempSrc:   Oral Oral  SpO2: 93% 94% 95% 95%  Weight:   77.9 kg   Height:   6' (1.829 m)     Intake/Output Summary (Last 24 hours) at 09/05/2018 0818 Last data filed at 09/05/2018 0648 Gross per 24 hour  Intake -  Output 200 ml  Net -200 ml   Filed Weights   09/04/18 2046 09/05/18 0045  Weight: 77.1 kg 77.9 kg   Body mass index is 23.29 kg/m.  General:  Well nourished, well developed, in no acute distress HEENT: normal Lymph: no adenopathy Neck: + JVD Endocrine:  No thryomegaly Vascular: No carotid bruits; FA pulses 2+ bilaterally without bruits  Cardiac:  normal S1, S2; RRR; no murmur Lungs: Faint rales bibasilar Abd: soft, nontender, no hepatomegaly  Ext: no edema Musculoskeletal:  No deformities, BUE and BLE strength normal and equal Skin: warm and dry  Neuro:  CNs 2-12 intact, no focal abnormalities noted Psych:  Normal affect   EKG:  The EKG was personally reviewed and demonstrates: Sinus rhythm at rate of 89 bpm, PVC, nonspecific T wave inversion which was present on prior EKG Telemetry:  Telemetry was personally reviewed and demonstrates: Sinus rhythm at controlled ventricular rate with PVC  Relevant CV Studies: Echocardiogram October 2018 Study Conclusions  - Left ventricle: The cavity size was moderately dilated. Wall   thickness was increased in a pattern of  moderate LVH. Systolic   function was moderately reduced. The estimated ejection fraction   was in the range of 35% to 40%. Diffuse hypokinesis. Left   ventricular diastolic function parameters were normal. - Mitral valve: There was mild regurgitation. - Atrial septum: No defect or patent foramen ovale was identified.  Laboratory Data:  Chemistry Recent Labs  Lab 09/04/18 2052  NA 138  K 4.1  CL 106  CO2 26  GLUCOSE 237*  BUN 11  CREATININE 1.33*  CALCIUM 9.0  GFRNONAA 58*  GFRAA >60  ANIONGAP 6    No results for input(s): PROT, ALBUMIN, AST, ALT, ALKPHOS, BILITOT in the last 168 hours. Hematology Recent Labs  Lab 09/04/18 2052  WBC 8.5  RBC 4.43  HGB 12.9*  HCT 44.1  MCV 99.5  MCH 29.1  MCHC 29.3*  RDW 13.5  PLT 244   Cardiac Enzymes Recent Labs  Lab 09/05/18 0004 09/05/18 0406 09/05/18 0706  TROPONINI <0.03 <0.03 <0.03    Recent Labs  Lab 09/04/18 2052 09/04/18 2251  TROPIPOC 0.02 0.00    BNP Recent Labs  Lab 09/04/18 2057  BNP 199.5*    DDimer No results for input(s): DDIMER in the last 168 hours.  Radiology/Studies:  Dg Chest 2 View  Result Date: 09/04/2018 CLINICAL DATA:  Chest pain EXAM: CHEST - 2 VIEW COMPARISON:  06/08/2017 FINDINGS: Unchanged position of left chest wall AICD leads. Mild cardiomegaly. No focal airspace consolidation or pulmonary edema. No pleural effusion or pneumothorax. IMPRESSION: No active cardiopulmonary disease. Electronically Signed   By: Ulyses Jarred M.D.   On: 09/04/2018 22:45    Assessment and Plan:   1. Chest pain with hx of CAD - Last ischemic evaluation with coronary angiography (12/2008) with mid LAD totally occluded and collateralized. Troponin has been negative.  EKG without acute changes.  His symptoms are relieved after pain medication  and nitro x1 in ER.  He was very active during workout at [facility] 3 weeks ago without any limitation.  His symptoms concerning or [place] but can not r/o CHF exacerbation.  Pending echocardiogram. He already had his breakfast.   2.  Systolic heart failure/Mixed cardiomyopathy -Last echocardiogram October 2018 showed improved LV function to 35 to 40%.  Intermittent orthopnea with dyspnea.  Takes Lasix 2 times per week.  BNP 200.  Chest x-ray without acute cardiopulmonary disease.    Home medication on hold. Will review with MD  3. HTN - BP stable. Home meds on hold.  4. DM - Per primary team.       For questions or updates, please contact [place] Please consult [email or web address] for contact info under     Jarrett Soho, PA  09/05/2018 8:18 AM

## 2018-09-05 NOTE — Discharge Summary (Signed)
Physician Discharge Summary  Sem Neila Gear. WLN:[id number] DOB: 10-Oct-1961 DOA: 09/04/2018  PCP: Golden Circle, FNP  Admit date: 09/04/2018 Discharge date: 09/05/2018  Admitted From: home Discharge disposition: home   Recommendations for Outpatient Follow-Up:   Patient to start weighing self daily and recording to bring to CHF clinic Lasix changed: was taking PRN (average of 2 times/week)-- will increased to MWF for now--- per CHF team recommendations QOD outpatient stress test  Discharge Diagnosis:   Principal Problem:   Chest discomfort Active Problems:   Automatic implantable cardioverter-defibrillator in situ   Chronic systolic heart failure (HCC)   CAD (coronary artery disease)   Cocaine abuse ([place])   Essential hypertension   Cardiac LV ejection fraction 10-20%   Alcohol abuse with alcohol-induced mood disorder ([place])   Chest pain    Discharge Condition: Improved.  Diet recommendation: Low sodium, heart healthy.  Wound care: None.  Code status: Full.   History of Present Illness:   Dennis [name]. is a 57 y.o. male with medical history significant of CAD, AICD, chf ef in past of 10%, drug abuse and etoh abuse in remission for almost 3 years, comes in with sscp and pressure that occurred this am when he awoke associated with sob no n/v no fevers no cough.  No radiation of pain.  Pain present for hours PTA give gi meds in ed with no relieve then given ntg and opiates with total relief of pain.  No le edema or swelling.  Has no h/o angina ever in the past.  He believes last cath was about a year ago.  Pt referred for admission for his chest pain   Hospital Course by Problem:   Chest pain -probably due to volume overload -seen by cards and given 1 dose of IV lasix with good UOP and improvement of symptoms -outpatient ischemia work up  Acute on chronic systolic CHF -EF  74-08 -increased lasix from PRN to at least TID And recheck  HTN -resume home  meds (adjustment to lasix dose)  H/o cocaine abuse/alcohol abuse    Medical Consultants:   cards   Discharge Exam:    Vitals:   09/05/18 0000 09/05/18 0045 09/05/18 0648 09/05/18 1212  BP: 121/82 123/87 (!) 132/94 (!) 131/94  Pulse: 88 89 87 92  Resp: 18 16 16 16   Temp:  97.9 F (36.6 C) (!) 97.5 F (36.4 C) 98.9 F (37.2 C)  TempSrc:  Oral Oral Oral  SpO2: 94% 95% 95% 98%  Weight:  77.9 kg    Height:  6' (1.829 m)      General exam: Appears calm and comfortable.   The results of significant diagnostics from this hospitalization (including imaging, microbiology, ancillary and laboratory) are listed below for reference.     Procedures and Diagnostic Studies:   Dg Chest 2 View  Result Date: 09/04/2018 CLINICAL DATA:  Chest pain EXAM: CHEST - 2 VIEW COMPARISON:  06/08/2017 FINDINGS: Unchanged position of left chest wall AICD leads. Mild cardiomegaly. No focal airspace consolidation or pulmonary edema. No pleural effusion or pneumothorax. IMPRESSION: No active cardiopulmonary disease. Electronically Signed   By: Ulyses Jarred M.D.   On: 09/04/2018 22:45     Labs:   Basic Metabolic Panel: Recent Labs  Lab 09/04/18 2052  NA 138  K 4.1  CL 106  CO2 26  GLUCOSE 237*  BUN 11  CREATININE 1.33*  CALCIUM 9.0   GFR Estimated Creatinine Clearance: 68.1 mL/min (A) (by  C-G formula based on SCr of 1.33 mg/dL (H)). Liver Function Tests: No results for input(s): AST, ALT, ALKPHOS, BILITOT, PROT, ALBUMIN in the last 168 hours. No results for input(s): LIPASE, AMYLASE in the last 168 hours. No results for input(s): AMMONIA in the last 168 hours. Coagulation profile Recent Labs  Lab 09/01/18 1439 09/04/18 2058  INR 3.9* 1.70    CBC: Recent Labs  Lab 09/04/18 2052  WBC 8.5  HGB 12.9*  HCT 44.1  MCV 99.5  PLT 244   Cardiac Enzymes: Recent Labs  Lab 09/05/18 0004 09/05/18 0406 09/05/18 0706  TROPONINI <0.03 <0.03 <0.03   BNP: Invalid input(s):  POCBNP CBG: Recent Labs  Lab 09/05/18 0048 09/05/18 0731 09/05/18 1148  GLUCAP 208* 215* 134*   D-Dimer No results for input(s): DDIMER in the last 72 hours. Hgb A1c No results for input(s): HGBA1C in the last 72 hours. Lipid Profile No results for input(s): CHOL, HDL, LDLCALC, TRIG, CHOLHDL, LDLDIRECT in the last 72 hours. Thyroid function studies No results for input(s): TSH, T4TOTAL, T3FREE, THYROIDAB in the last 72 hours.  Invalid input(s): FREET3 Anemia work up No results for input(s): VITAMINB12, FOLATE, FERRITIN, TIBC, IRON, RETICCTPCT in the last 72 hours. Microbiology No results found for this or any previous visit (from the past 240 hour(s)).   Discharge Instructions:   Discharge Instructions    (HEART FAILURE PATIENTS) Call MD:  Anytime you have any of the following symptoms: 1) 3 pound weight gain in 24 hours or 5 pounds in 1 week 2) shortness of breath, with or without a dry hacking cough 3) swelling in the hands, feet or stomach 4) if you have to sleep on extra pillows at night in order to breathe.   Complete by:  As directed    Diet - low sodium heart healthy   Complete by:  As directed    Diet Carb Modified   Complete by:  As directed    Discharge instructions   Complete by:  As directed    WEIGH YOURSELF DAILY   Increase activity slowly   Complete by:  As directed      Allergies as of 09/05/2018   No Known Allergies     Medication List    STOP taking these medications   CORLANOR 7.5 MG Tabs tablet Generic drug:  ivabradine   glucose blood test strip   nicotine 21 mg/24hr patch Commonly known as:  NICODERM CQ - dosed in mg/24 hours     TAKE these medications   atorvastatin 20 MG tablet Commonly known as:  LIPITOR TAKE 1 TABLET BY MOUTH DAILY AT 6 PM. What changed:  See the new instructions.   carvedilol 6.25 MG tablet Commonly known as:  COREG TAKE 1 TABLET BY MOUTH 2 TIMES DAILY WITH A MEAL. What changed:  See the new instructions.    furosemide 40 MG tablet Commonly known as:  LASIX Take 1 tablet (40 mg total) by mouth every Monday, Wednesday, and Friday. Start taking on:  09/06/2018 What changed:    when to take this  reasons to take this   LANTUS SOLOSTAR 100 UNIT/ML Solostar Pen Generic drug:  Insulin Glargine Inject 32 Units into the skin daily at 10 pm.   metFORMIN 500 MG tablet Commonly known as:  GLUCOPHAGE Take 500 mg by mouth 2 (two) times daily with a meal.   sacubitril-valsartan 97-103 MG Commonly known as:  ENTRESTO Take 1 tablet by mouth 2 (two) times daily.   spironolactone 25  MG tablet Commonly known as:  ALDACTONE TAKE 1/2 TABLET BY MOUTH DAILY.   tadalafil 20 MG tablet Commonly known as:  ADCIRCA/CIALIS Take 1 tablet (20 mg total) by mouth daily as needed for erectile dysfunction.   warfarin 7.5 MG tablet Commonly known as:  COUMADIN Take as directed. If you are unsure how to take this medication, talk to your nurse or doctor. Original instructions:  TAKE BY MOUTH AS DIRECTED BY COUMADIN CLINIC What changed:  See the new instructions.      Follow-up Information    Bensimhon, Shaune Pascal, MD Follow up.   Specialty:  Cardiology Why:  Office will call with time and date of stress test and follow up appointment afterwards. if you did not heard by Tuesday afternoon, give [place] a call.  Contact information: [place] [address] [phone]            Time coordinating discharge: 25 min  Signed:  Geradine Girt DO  [facility] 09/05/2018, 12:57 PM

## 2018-09-14 ENCOUNTER — Telehealth (HOSPITAL_COMMUNITY): Payer: Self-pay | Admitting: Vascular Surgery

## 2018-09-14 NOTE — Telephone Encounter (Signed)
Sent pt reminder letter for echo @ 11 and chf f/u @ 11 12 noon 10/21/18,

## 2018-09-16 ENCOUNTER — Telehealth (HOSPITAL_COMMUNITY): Payer: Self-pay | Admitting: *Deleted

## 2018-09-16 NOTE — Telephone Encounter (Signed)
Patient given detailed instructions per Myocardial Perfusion Study Information Sheet for the test on 09/21/18. Patient notified to arrive 15 minutes early and that it is imperative to arrive on time for appointment to keep from having the test rescheduled.  If you need to cancel or reschedule your appointment, please call the office within 24 hours of your appointment. . Patient verbalized understanding.Kirstie Peri

## 2018-09-21 ENCOUNTER — Ambulatory Visit (HOSPITAL_COMMUNITY): Payer: Medicare Other | Attending: Cardiovascular Disease

## 2018-09-21 DIAGNOSIS — I25110 Atherosclerotic heart disease of native coronary artery with unstable angina pectoris: Secondary | ICD-10-CM | POA: Insufficient documentation

## 2018-09-21 LAB — MYOCARDIAL PERFUSION IMAGING
Estimated workload: 12.1 METS
Exercise duration (min): 10 min
Exercise duration (sec): 15 s
LV dias vol: 225 mL (ref 62–150)
LV sys vol: 195 mL
MPHR: 164 {beats}/min
Peak HR: 139 {beats}/min
Percent HR: 85 %
RPE: 20
Rest HR: 93 {beats}/min
SDS: 5
SRS: 5
SSS: 10
TID: 0.84

## 2018-09-21 MED ORDER — TECHNETIUM TC 99M TETROFOSMIN IV KIT
31.4000 | PACK | Freq: Once | INTRAVENOUS | Status: AC | PRN
  Administered 2018-09-21: 31.4 via INTRAVENOUS
  Filled 2018-09-21: qty 32

## 2018-09-21 MED ORDER — TECHNETIUM TC 99M TETROFOSMIN IV KIT
9.6000 | PACK | Freq: Once | INTRAVENOUS | Status: AC | PRN
  Administered 2018-09-21: 9.6 via INTRAVENOUS
  Filled 2018-09-21: qty 10

## 2018-09-27 NOTE — Telephone Encounter (Signed)
Spoke with pt re: stress test results.  He has been advised to keep his f/u appt with CHF clinic. Pt verbalized understanding.

## 2018-10-01 ENCOUNTER — Ambulatory Visit (INDEPENDENT_AMBULATORY_CARE_PROVIDER_SITE_OTHER): Payer: Medicare Other | Admitting: Pharmacist

## 2018-10-01 DIAGNOSIS — Z7901 Long term (current) use of anticoagulants: Secondary | ICD-10-CM

## 2018-10-01 DIAGNOSIS — I428 Other cardiomyopathies: Secondary | ICD-10-CM

## 2018-10-01 LAB — POCT INR: INR: 1.8 — AB (ref 2.0–3.0)

## 2018-10-01 NOTE — Patient Instructions (Signed)
Take one tablet today then continue taking 1 tablet every day except 1/2 tablet on Mondays, Wednesdays, and Fridays. Recheck in 2 -3 weeks.  Main # [phone] Coumadin Clinic # [phone].

## 2018-10-15 ENCOUNTER — Other Ambulatory Visit (HOSPITAL_COMMUNITY): Payer: Self-pay | Admitting: Internal Medicine

## 2018-10-15 DIAGNOSIS — I5023 Acute on chronic systolic (congestive) heart failure: Secondary | ICD-10-CM

## 2018-10-21 ENCOUNTER — Encounter (HOSPITAL_COMMUNITY): Payer: Medicare Other

## 2018-10-21 ENCOUNTER — Ambulatory Visit (HOSPITAL_COMMUNITY): Payer: Medicare Other

## 2018-11-02 ENCOUNTER — Encounter (HOSPITAL_COMMUNITY): Payer: Self-pay

## 2018-11-02 ENCOUNTER — Ambulatory Visit (HOSPITAL_BASED_OUTPATIENT_CLINIC_OR_DEPARTMENT_OTHER)
Admission: RE | Admit: 2018-11-02 | Discharge: 2018-11-02 | Disposition: A | Discharge status: Home / Self Care | Payer: Medicare Other | Source: Ambulatory Visit | Attending: Cardiology | Admitting: Cardiology

## 2018-11-02 ENCOUNTER — Ambulatory Visit (HOSPITAL_COMMUNITY)
Admission: RE | Admit: 2018-11-02 | Discharge: 2018-11-02 | Disposition: A | Discharge status: Home / Self Care | Payer: Medicare Other | Source: Ambulatory Visit | Attending: Adult Health | Admitting: Adult Health

## 2018-11-02 VITALS — BP 128/88 | HR 91 | Wt 178.2 lb

## 2018-11-02 DIAGNOSIS — I25110 Atherosclerotic heart disease of native coronary artery with unstable angina pectoris: Secondary | ICD-10-CM

## 2018-11-02 DIAGNOSIS — E1169 Type 2 diabetes mellitus with other specified complication: Secondary | ICD-10-CM

## 2018-11-02 DIAGNOSIS — Z8673 Personal history of transient ischemic attack (TIA), and cerebral infarction without residual deficits: Secondary | ICD-10-CM | POA: Diagnosis not present

## 2018-11-02 DIAGNOSIS — F17210 Nicotine dependence, cigarettes, uncomplicated: Secondary | ICD-10-CM | POA: Insufficient documentation

## 2018-11-02 DIAGNOSIS — Z79899 Other long term (current) drug therapy: Secondary | ICD-10-CM | POA: Diagnosis not present

## 2018-11-02 DIAGNOSIS — F1910 Other psychoactive substance abuse, uncomplicated: Secondary | ICD-10-CM | POA: Insufficient documentation

## 2018-11-02 DIAGNOSIS — Z720 Tobacco use: Secondary | ICD-10-CM

## 2018-11-02 DIAGNOSIS — I5022 Chronic systolic (congestive) heart failure: Secondary | ICD-10-CM

## 2018-11-02 DIAGNOSIS — Z9119 Patient's noncompliance with other medical treatment and regimen: Secondary | ICD-10-CM | POA: Diagnosis not present

## 2018-11-02 DIAGNOSIS — I428 Other cardiomyopathies: Secondary | ICD-10-CM | POA: Insufficient documentation

## 2018-11-02 DIAGNOSIS — Z8249 Family history of ischemic heart disease and other diseases of the circulatory system: Secondary | ICD-10-CM | POA: Insufficient documentation

## 2018-11-02 DIAGNOSIS — F1011 Alcohol abuse, in remission: Secondary | ICD-10-CM | POA: Insufficient documentation

## 2018-11-02 DIAGNOSIS — Z833 Family history of diabetes mellitus: Secondary | ICD-10-CM | POA: Insufficient documentation

## 2018-11-02 DIAGNOSIS — I255 Ischemic cardiomyopathy: Secondary | ICD-10-CM | POA: Diagnosis not present

## 2018-11-02 DIAGNOSIS — Z95810 Presence of automatic (implantable) cardiac defibrillator: Secondary | ICD-10-CM | POA: Diagnosis not present

## 2018-11-02 DIAGNOSIS — Z7901 Long term (current) use of anticoagulants: Secondary | ICD-10-CM | POA: Insufficient documentation

## 2018-11-02 DIAGNOSIS — Z794 Long term (current) use of insulin: Secondary | ICD-10-CM | POA: Diagnosis not present

## 2018-11-02 DIAGNOSIS — E7800 Pure hypercholesterolemia, unspecified: Secondary | ICD-10-CM | POA: Diagnosis not present

## 2018-11-02 DIAGNOSIS — E119 Type 2 diabetes mellitus without complications: Secondary | ICD-10-CM | POA: Insufficient documentation

## 2018-11-02 DIAGNOSIS — I2510 Atherosclerotic heart disease of native coronary artery without angina pectoris: Secondary | ICD-10-CM | POA: Diagnosis not present

## 2018-11-02 DIAGNOSIS — I509 Heart failure, unspecified: Secondary | ICD-10-CM

## 2018-11-02 DIAGNOSIS — I083 Combined rheumatic disorders of mitral, aortic and tricuspid valves: Secondary | ICD-10-CM | POA: Diagnosis not present

## 2018-11-02 DIAGNOSIS — E785 Hyperlipidemia, unspecified: Secondary | ICD-10-CM | POA: Diagnosis not present

## 2018-11-02 LAB — BRAIN NATRIURETIC PEPTIDE: B Natriuretic Peptide: 386 pg/mL — ABNORMAL HIGH (ref 0.0–100.0)

## 2018-11-02 LAB — BASIC METABOLIC PANEL
Anion gap: 7 (ref 5–15)
BUN: 13 mg/dL (ref 6–20)
CO2: 26 mmol/L (ref 22–32)
Calcium: 8.5 mg/dL — ABNORMAL LOW (ref 8.9–10.3)
Chloride: 105 mmol/L (ref 98–111)
Creatinine, Ser: 1.16 mg/dL (ref 0.61–1.24)
GFR calc Af Amer: >60 mL/min (ref >60)
GFR calc non Af Amer: >60 mL/min (ref >60)
Glucose, Bld: 256 mg/dL — ABNORMAL HIGH (ref 70–99)
Potassium: 4 mmol/L (ref 3.5–5.1)
Sodium: 138 mmol/L (ref 135–145)

## 2018-11-02 MED ORDER — FUROSEMIDE 40 MG PO TABS: 20.0000 mg | ORAL_TABLET | Freq: Every day | ORAL | 3 refills | Status: DC

## 2018-11-02 NOTE — Patient Instructions (Signed)
Labs were done today. We will call you with any ABNORMAL results. No news is good news!  Please take 40mg  (2 tabs) of Lasix tomorrow only.  Thursday, begin taking Lasix 20mg  (1 tab) daily.   Follow-up lab work in 2 weeks.  Your physician recommends that you schedule a follow-up appointment in 4 weeks.

## 2018-11-02 NOTE — Progress Notes (Signed)
  Echocardiogram 2D Echocardiogram has been performed.  Dennis [name] M 11/02/2018, 2:55 PM

## 2018-11-02 NOTE — Progress Notes (Addendum)
Patient ID: Dennis [name]., male   DOB: 1961-03-15, 58 y.o.   MRN: [id number]      Advanced Heart Failure Clinic Note   HF MD: Dr Haroldine Laws PCP: [facility] EP: Dr Lovena Le   HPI: Dennis [name]. is a 58 y.o. male  with h/o DM2, polysubstance use (tobacco, cocaine) last sused 2017, mixed ICM/NICM and chronic systolic HF due to ventricular noncompaction- on chronic coumadin, noncompliance   Admitted to [facility] in 4/16 with acute CVA and with L sided weakness. CT showed R MCA stroke. Did not receive TPA due to delay in arrival. Had ECHO that showed EF 10-15% mild RV HK. No obvious LV clot. As he improved he was transferred to CIR but then developed hypotension and transferred back to HF ward. Underwent RHC on 4/27. Filling pressures mildly elevated CO ok. Diuresed gently. Meds limited by SBP in 80s. Could only tolerate ivabradine 7.5 bid, digoxin and spiro 12.5. Lasix and carvedilol stopped.   Admitted in November with CP. Troponin negative, scheduled for stress test. Had Myoview 09/21/18 which showed no ischemia, but prior infarct, noted high risk due to low EF.   He presents today for regular follow up. He has been taking lasix twice weekly, instead of M/W/F or every other day. He hasn't had any chest pain since admission in November. He has been slightly more SOB over the past 10 days, with orthopnea, and a "dry, hacking cough". He doesn't follow weight regularly at home. Continues to smoke about 1/2 ppd. He is taking lasix twice weekly, instead of M/W/F as in system, or 40 mg every other day, as ordered last time he was here. He denies lightheadedness or dizziness. No CP.   ECHO 2016 EF 15%.  Echo (9/17): EF 25-30%, moderate LV dilation with prominent trabeculation, normal RV size and systolic function.  Echo 08/12/17 EF 45-50%, much improved from previous  Per Dr [place]an Browner  [place] 02/07/15 RA = 3 RV = 50/4/7 PA = 55/14 (34) PCW = 22 Fick cardiac output/index = 4.6/2.4 Thermodilution CO/CI  = 4.8/2.5 PVR = 2.5 WU SVR = 1443 FA sat = 96% PA sat = 51%, 56%, 61%  Noninvasive Ao pressure = 106/74 (86)   CPX 02/07/2016 Peak VO2: 18.7 (54% predicted peak VO2) VE/VCO2 slope: 30 OUES: 1.62 Peak RER:  1.13 Ventilatory Threshold: 10.9 (32% predicted or measured peak VO2)  CPX 12/24/16 Peak VO2 19.6 (58% predicted peak VO2) VE/VCO2 slope: 31 OUES: 1.79 Peak RER 1.09 Ventilatory threshold: 14.9 (44% predicted or measure peak VO2)  Review of systems complete and found to be negative unless listed in HPI.    Past Medical History:  Diagnosis Date  . Automatic implantable cardioverter-defibrillator in situ   . CHF (congestive heart failure) ([place])   . Chronic systolic heart failure (HCC)    a) Mixed ICM/NICM b) RHC (05/2014): RA 2, RV 19/2/3, PA 22/14 (18), PCWP 6, Fick CO/CI: 5.2 / 2.7, PVR 2.3 WU, PA 60% and 64% c) ECHO (05/2014): EF 20-25%, diff HK, akinesis entireanteroseptal myocardium, triv AI, mod MR, LA mod/sev dilated  . Drug abuse and dependence ([place])   . Heart murmur   . High cholesterol   . Ischemic cardiomyopathy    a) Coronary angiography (12/2008) at [facility]: Lmain: nl, LAD mid 100% stenosis with left to left and right-to-left collaterals to the distal LAD; Lcx: nl, RCA nl.    . 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[place])   . Pneumonia 1988  . Sleep apnea    "  cleared after T&A"  . Type II diabetes mellitus ([place])      Current Outpatient Medications on File Prior to Visit  Medication Sig Dispense Refill  . atorvastatin (LIPITOR) 20 MG tablet TAKE 1 TABLET BY MOUTH DAILY AT 6 PM. (Patient taking differently: Take 20 mg by mouth daily. ) 30 tablet 6  . carvedilol (COREG) 6.25 MG tablet Take 1 tablet (6.25 mg total) by mouth 2 (two) times daily with a meal. 30 tablet 0  . ENTRESTO 97-103 MG TAKE 1 TABLET BY MOUTH TWO TIMES DAILY. 60 tablet 0  . furosemide (LASIX) 40 MG tablet Take 1 tablet (40 mg total) by mouth every Monday, Wednesday, and Friday. 40 tablet 0  . Insulin  Glargine (LANTUS SOLOSTAR) 100 UNIT/ML Solostar Pen Inject 32 Units into the skin daily at 10 pm.     . metFORMIN (GLUCOPHAGE) 500 MG tablet Take 500 mg by mouth 2 (two) times daily with a meal.    . spironolactone (ALDACTONE) 25 MG tablet TAKE 1/2 TABLET BY MOUTH DAILY. (Patient taking differently: Take 12.5 mg by mouth daily. ) 45 tablet 3  . tadalafil (CIALIS) 20 MG tablet Take 1 tablet (20 mg total) by mouth daily as needed for erectile dysfunction. 10 tablet 0  . warfarin (COUMADIN) 7.5 MG tablet TAKE BY MOUTH AS DIRECTED BY COUMADIN CLINIC (Patient taking differently: Take 3.75-7.5 mg by mouth See admin instructions. Take 3.75 mg on Monday, Wednesday and Friday.  All other days take 7.5 mg) 35 tablet 3   No current facility-administered medications on file prior to visit.     No Known Allergies  Social History   Socioeconomic History  . Marital status: Single    Spouse name: Not on file  . Number of children: 1  . Years of education: 38  . Highest education level: Not on file  Occupational History  . Occupation: Disabled  Social Needs  . Financial resource strain: Not on file  . Food insecurity:    Worry: Not on file    Inability: Not on file  . Transportation needs:    Medical: Not on file    Non-medical: Not on file  Tobacco Use  . Smoking status: Current Some Day Smoker    Packs/day: 0.25    Years: 31.00    Pack years: 7.75    Types: Cigarettes  . Smokeless tobacco: Never Used  Substance and Sexual Activity  . Alcohol use: Yes    Alcohol/week: 0.0 standard drinks    Comment: 09/21/2014 "might have a drink q 6 /months, if that"  . Drug use: Yes    Types: Cocaine    Comment: 09/21/2014 "last cocaine ~ 1 1/2 months ago"  . Sexual activity: Yes  Lifestyle  . Physical activity:    Days per week: Not on file    Minutes per session: Not on file  . Stress: Not on file  Relationships  . Social connections:    Talks on phone: Not on file    Gets together: Not on file     Attends religious service: Not on file    Active member of club or organization: Not on file    Attends meetings of clubs or organizations: Not on file    Relationship status: Not on file  . Intimate partner violence:    Fear of current or ex partner: Not on file    Emotionally abused: Not on file    Physically abused: Not on file    Forced  sexual activity: Not on file  Other Topics Concern  . Not on file  Social History Narrative   Lives in [place] with his mother. No pets. Fun: movies, sports, daughter.    Denies religious beliefs that would effect health care.             Family History  Problem Relation Age of Onset  . Diabetes Mother   . Heart disease Mother   . Diabetes Father   . Prostate cancer Father   . Heart disease Father   . Diabetes Brother   . Diabetes Brother     Vitals:   11/02/18 1459  BP: 128/88  Pulse: 91  SpO2: 99%  Weight: 80.8 kg (178 lb 3.2 oz)   Wt Readings from Last 3 Encounters:  09/21/18 78 kg (172 lb)  09/05/18 77.9 kg (171 lb 11.8 oz)  05/31/18 72.7 kg (160 lb 3.2 oz)     PHYSICAL EXAM: General: Well appearing. No resp difficulty. HEENT: Normal Neck: Supple. JVP 10-11 cm +. Carotids 2+ bilat; no bruits. No thyromegaly or nodule noted. Cor: PMI nondisplaced. RRR, No M/G/R noted Lungs: CTAB, normal effort. Abdomen: Soft, non-tender, non-distended, no HSM. No bruits or masses. +BS  Extremities: No cyanosis, clubbing, or rash. 1-2+ BLE edema.  Neuro: Alert & orientedx3, cranial nerves grossly intact. moves all 4 extremities w/o difficulty. Affect pleasant   ASSESSMENT & PLAN: 1. Chronic Systolic Heart Failure-Mixed NICM/ICM .  S/P [place] Dual Chamber. ICD. - Echo 09/05/2018 LVEF 20-25%.  - Echo today pending.  - CPX 12/2016 with Moderate HF limitation/restrictive lung physiology. Stable from previous. - NYHA II-III symptoms in setting of volume overload.  - Volume status elevated on exam.  - Take lasix 40 mg x 1 tomorrow,  and then increase to 20 mg daily. He endorses great response on a dose of lasix he took this am.  - Continue coreg 6.25 mg BID.  Consider increase at next visit when not volume overloaded.  - Continue entresto 97/103 mg BID.  - Continue spironolactone 12.5 mg daily.  - Reinforced fluid restriction to < 2 L daily, sodium restriction to less than 2000 mg daily, and the importance of daily weights.   2. Hx of embolic CVA - INR managed per coumadin clinic.  - Denies bleeding.  3. CAD Coronary angiography (12/2008) with mid LAD totally occluded and collateralized. - Stress test 09/21/18 with no ischemia and prior infarct. HF meds as above.  - No s/s of ischemia since admit in November.  Knows to let us know if he has any further chest pain.  - Continue atorvastatin 20 mg daily.  - No ASA with stable CAD and use of coumadin  for LV  noncompaction.  4. DMII - Per PCP.  5. Polysubstance abuse  - Sober since 09/17/2015. Attends NA 6 times weekly and lives with his sponsor.  - Pt will be 2 years sober in December. Congratulated.  6. Tobacco Abuse - Encouraged smoking cessation.   Volume overloaded on exam, but not significantly. Increase lasix as above. Labs today and 2 weeks with increase.  RTC 1 month, sooner with symptoms.   Echo pending. If EF remains low. Consider repeat CPX test.   Shirley Friar, PA-C  11/02/18   Greater than 50% of the 25 minute visit was spent in counseling/coordination of care regarding disease state education, salt/fluid restriction, sliding scale diuretics, and medication compliance.

## 2018-11-04 ENCOUNTER — Telehealth (HOSPITAL_COMMUNITY): Payer: Self-pay

## 2018-11-04 NOTE — Telephone Encounter (Signed)
Shirley Friar, PA-C sent to Marlise Eves, RN        EF remains low at 20-25%. He needs to repeat CPX test.     Called left voicemail to call us back

## 2018-11-04 NOTE — Telephone Encounter (Signed)
-----   Message from Shirley Friar, PA-C sent at 11/03/2018  7:24 AM EST ----- EF remains low at 20-25%. He needs to repeat CPX test.    Beryle Beams" [place], PA-C 11/03/2018 7:24 AM

## 2018-11-10 ENCOUNTER — Telehealth (HOSPITAL_COMMUNITY): Payer: Self-pay

## 2018-11-10 NOTE — Telephone Encounter (Signed)
-----   Message from Shirley Friar, PA-C sent at 11/03/2018  7:24 AM EST ----- EF remains low at 20-25%. He needs to repeat CPX test.    Beryle Beams" [place], PA-C 11/03/2018 7:24 AM

## 2018-11-10 NOTE — Telephone Encounter (Signed)
Called pt. Left voicemail.

## 2018-11-16 ENCOUNTER — Other Ambulatory Visit (HOSPITAL_COMMUNITY): Payer: Medicare Other

## 2018-11-23 ENCOUNTER — Other Ambulatory Visit (HOSPITAL_COMMUNITY): Payer: Medicare Other

## 2018-11-26 ENCOUNTER — Encounter (HOSPITAL_COMMUNITY): Payer: Self-pay

## 2018-11-26 ENCOUNTER — Other Ambulatory Visit (HOSPITAL_COMMUNITY): Payer: Medicare Other

## 2018-11-29 ENCOUNTER — Other Ambulatory Visit (HOSPITAL_COMMUNITY): Payer: Medicare Other

## 2018-11-30 ENCOUNTER — Encounter (HOSPITAL_COMMUNITY): Payer: Medicare Other

## 2018-12-01 ENCOUNTER — Telehealth (HOSPITAL_COMMUNITY): Payer: Self-pay

## 2018-12-01 DIAGNOSIS — I5022 Chronic systolic (congestive) heart failure: Secondary | ICD-10-CM

## 2018-12-01 NOTE — Telephone Encounter (Signed)
error 

## 2018-12-01 NOTE — Telephone Encounter (Signed)
Pt called at made aware of ECHO results and recommendations to repeat CPX per NP.  As patient has lab appointment for next Wednesday 2/26 1:45p, pt is scheduled to have CPX on 2/26 at 2pm

## 2018-12-01 NOTE — Telephone Encounter (Signed)
-----   Message from Conrad Barryton, NP sent at 11/25/2018  3:51 PM EST ----- No change from ECHO in November 2019. Please call.

## 2018-12-08 ENCOUNTER — Other Ambulatory Visit (HOSPITAL_COMMUNITY): Payer: Self-pay | Admitting: *Deleted

## 2018-12-08 ENCOUNTER — Encounter (HOSPITAL_COMMUNITY): Payer: Medicare Other

## 2018-12-08 ENCOUNTER — Other Ambulatory Visit (HOSPITAL_COMMUNITY): Payer: Medicare Other

## 2018-12-09 ENCOUNTER — Telehealth: Payer: Self-pay | Admitting: Internal Medicine

## 2018-12-09 ENCOUNTER — Other Ambulatory Visit: Payer: Self-pay

## 2018-12-09 ENCOUNTER — Encounter (HOSPITAL_COMMUNITY): Payer: Medicare Other

## 2018-12-09 MED ORDER — WARFARIN SODIUM 7.5 MG PO TABS: 3.7500 mg | ORAL_TABLET | ORAL | 0 refills | Status: DC

## 2018-12-09 NOTE — Telephone Encounter (Signed)
°*  STAT* If patient is at the pharmacy, call can be transferred to refill team.   1. Which medications need to be refilled? (please list name of each medication and dose if known) [place]  2. Which pharmacy/location (including street and city if local pharmacy) is medication to be sent to? [facility]  3. Do they need a 30 day or 90 day supply? 30 but will do 90 if possible.

## 2018-12-15 ENCOUNTER — Other Ambulatory Visit: Payer: Self-pay

## 2018-12-15 ENCOUNTER — Ambulatory Visit (HOSPITAL_COMMUNITY): Payer: Medicare Other

## 2018-12-15 ENCOUNTER — Ambulatory Visit (HOSPITAL_COMMUNITY)
Admission: RE | Admit: 2018-12-15 | Discharge: 2018-12-15 | Disposition: A | Discharge status: Home / Self Care | Payer: Medicare Other | Source: Ambulatory Visit | Attending: Cardiology | Admitting: Cardiology

## 2018-12-15 ENCOUNTER — Other Ambulatory Visit (HOSPITAL_COMMUNITY): Payer: Self-pay | Admitting: *Deleted

## 2018-12-15 ENCOUNTER — Encounter (HOSPITAL_COMMUNITY): Payer: Self-pay

## 2018-12-15 ENCOUNTER — Ambulatory Visit (INDEPENDENT_AMBULATORY_CARE_PROVIDER_SITE_OTHER): Payer: Medicare Other | Admitting: *Deleted

## 2018-12-15 VITALS — BP 106/66 | HR 86 | Wt 165.4 lb

## 2018-12-15 DIAGNOSIS — Z794 Long term (current) use of insulin: Secondary | ICD-10-CM | POA: Diagnosis not present

## 2018-12-15 DIAGNOSIS — Z8249 Family history of ischemic heart disease and other diseases of the circulatory system: Secondary | ICD-10-CM | POA: Diagnosis not present

## 2018-12-15 DIAGNOSIS — E119 Type 2 diabetes mellitus without complications: Secondary | ICD-10-CM | POA: Insufficient documentation

## 2018-12-15 DIAGNOSIS — I25110 Atherosclerotic heart disease of native coronary artery with unstable angina pectoris: Secondary | ICD-10-CM

## 2018-12-15 DIAGNOSIS — I255 Ischemic cardiomyopathy: Secondary | ICD-10-CM

## 2018-12-15 DIAGNOSIS — Z95810 Presence of automatic (implantable) cardiac defibrillator: Secondary | ICD-10-CM | POA: Insufficient documentation

## 2018-12-15 DIAGNOSIS — Z79899 Other long term (current) drug therapy: Secondary | ICD-10-CM | POA: Insufficient documentation

## 2018-12-15 DIAGNOSIS — I509 Heart failure, unspecified: Secondary | ICD-10-CM

## 2018-12-15 DIAGNOSIS — Z833 Family history of diabetes mellitus: Secondary | ICD-10-CM | POA: Insufficient documentation

## 2018-12-15 DIAGNOSIS — I5022 Chronic systolic (congestive) heart failure: Secondary | ICD-10-CM | POA: Diagnosis present

## 2018-12-15 DIAGNOSIS — Z9119 Patient's noncompliance with other medical treatment and regimen: Secondary | ICD-10-CM | POA: Diagnosis not present

## 2018-12-15 DIAGNOSIS — E7800 Pure hypercholesterolemia, unspecified: Secondary | ICD-10-CM | POA: Insufficient documentation

## 2018-12-15 DIAGNOSIS — Z7901 Long term (current) use of anticoagulants: Secondary | ICD-10-CM

## 2018-12-15 DIAGNOSIS — Z8673 Personal history of transient ischemic attack (TIA), and cerebral infarction without residual deficits: Secondary | ICD-10-CM | POA: Insufficient documentation

## 2018-12-15 DIAGNOSIS — Z720 Tobacco use: Secondary | ICD-10-CM | POA: Diagnosis not present

## 2018-12-15 DIAGNOSIS — G4730 Sleep apnea, unspecified: Secondary | ICD-10-CM | POA: Diagnosis not present

## 2018-12-15 DIAGNOSIS — I63411 Cerebral infarction due to embolism of right middle cerebral artery: Secondary | ICD-10-CM

## 2018-12-15 DIAGNOSIS — F17210 Nicotine dependence, cigarettes, uncomplicated: Secondary | ICD-10-CM | POA: Diagnosis not present

## 2018-12-15 DIAGNOSIS — I428 Other cardiomyopathies: Secondary | ICD-10-CM | POA: Diagnosis not present

## 2018-12-15 DIAGNOSIS — I2510 Atherosclerotic heart disease of native coronary artery without angina pectoris: Secondary | ICD-10-CM | POA: Diagnosis not present

## 2018-12-15 DIAGNOSIS — I10 Essential (primary) hypertension: Secondary | ICD-10-CM

## 2018-12-15 LAB — BASIC METABOLIC PANEL
Anion gap: 8 (ref 5–15)
BUN: 23 mg/dL — ABNORMAL HIGH (ref 6–20)
CO2: 27 mmol/L (ref 22–32)
Calcium: 9.2 mg/dL (ref 8.9–10.3)
Chloride: 102 mmol/L (ref 98–111)
Creatinine, Ser: 1.42 mg/dL — ABNORMAL HIGH (ref 0.61–1.24)
GFR calc Af Amer: >60 mL/min (ref >60)
GFR calc non Af Amer: 55 mL/min — ABNORMAL LOW (ref >60)
Glucose, Bld: 238 mg/dL — ABNORMAL HIGH (ref 70–99)
Potassium: 4 mmol/L (ref 3.5–5.1)
Sodium: 137 mmol/L (ref 135–145)

## 2018-12-15 MED ORDER — FUROSEMIDE 40 MG PO TABS: 20.0000 mg | ORAL_TABLET | Freq: Every day | ORAL | 3 refills | Status: DC

## 2018-12-15 MED ORDER — SPIRONOLACTONE 25 MG PO TABS: 25.0000 mg | ORAL_TABLET | Freq: Every day | ORAL | 3 refills | Status: DC

## 2018-12-15 NOTE — Progress Notes (Signed)
Patient ID: Dennis Hehn., male   DOB: 03-19-1961, 58 y.o.   MRN: [id number]      Advanced Heart Failure Clinic Note  HF MD: Dr Haroldine Laws PCP: [facility] EP: Dr Lovena Le   HPI: Dennis Bero. is a 58 y.o. male  with h/o DM2, polysubstance use (tobacco, cocaine) last sused 2017, mixed ICM/NICM and chronic systolic HF due to ventricular noncompaction- on chronic coumadin, noncompliance   Admitted to [facility] in 4/16 with acute CVA and with L sided weakness. CT showed R MCA stroke. Did not receive TPA due to delay in arrival. Had ECHO that showed EF 10-15% mild RV HK. No obvious LV clot. As he improved he was transferred to CIR but then developed hypotension and transferred back to HF ward. Underwent RHC on 4/27. Filling pressures mildly elevated CO ok. Diuresed gently. Meds limited by SBP in 80s. Could only tolerate ivabradine 7.5 bid, digoxin and spiro 12.5. Lasix and carvedilol stopped.   Admitted in November with CP. Troponin negative, scheduled for stress test. Had Myoview 09/21/18 which showed no ischemia, but prior infarct, noted high risk due to low EF.   He presents today for regular follow up. He is feeling OK overall, but had lightheadedness and nausea while in the shower last week. He is down 13 lbs since last visit. He has continued to take his lasix 40 mg daily instead of 20 mg daily. Continues to smoke. Denies CP. No orthopnea or PND. Not SOB with ADLs. Has CPX scheduled for after this appointment.   Echo 10/2018 LVEF 20-25%, Grade 2 DD.   Echo 2016 EF 15%.  Echo (9/17): EF 25-30%, moderate LV dilation with prominent trabeculation, normal RV size and systolic function.  Echo 08/12/17 EF 45-50%, much improved from previous  Per Dr Vaughan Browner  [place] 02/07/15 RA = 3 RV = 50/4/7 PA = 55/14 (34) PCW = 22 Fick cardiac output/index = 4.6/2.4 Thermodilution CO/CI = 4.8/2.5 PVR = 2.5 WU SVR = 1443 FA sat = 96% PA sat = 51%, 56%, 61%  Noninvasive Ao pressure = 106/74 (86)   CPX  02/07/2016 Peak VO2: 18.7 (54% predicted peak VO2) VE/VCO2 slope: 30 OUES: 1.62 Peak RER:  1.13 Ventilatory Threshold: 10.9 (32% predicted or measured peak VO2)  CPX 12/24/16 Peak VO2 19.6 (58% predicted peak VO2) VE/VCO2 slope: 31 OUES: 1.79 Peak RER 1.09 Ventilatory threshold: 14.9 (44% predicted or measure peak VO2)  Review of systems complete and found to be negative unless listed in HPI.    Past Medical History:  Diagnosis Date  . Automatic implantable cardioverter-defibrillator in situ   . CHF (congestive heart failure) ([place])   . Chronic systolic heart failure (HCC)    a) Mixed ICM/NICM b) RHC (05/2014): RA 2, RV 19/2/3, PA 22/14 (18), PCWP 6, Fick CO/CI: 5.2 / 2.7, PVR 2.3 WU, PA 60% and 64% c) ECHO (05/2014): EF 20-25%, diff HK, akinesis entireanteroseptal myocardium, triv AI, mod MR, LA mod/sev dilated  . Drug abuse and dependence ([place])   . Heart murmur   . High cholesterol   . Ischemic cardiomyopathy    a) Coronary angiography (12/2008) at [facility]: Lmain: nl, LAD mid 100% stenosis with left to left and right-to-left collaterals to the distal LAD; Lcx: nl, RCA nl.    . 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[place])   . Pneumonia 1988  . Sleep apnea    "cleared after T&A"  . Type II diabetes mellitus ([place])      Current Outpatient Medications on File  Prior to Encounter  Medication Sig Dispense Refill  . atorvastatin (LIPITOR) 20 MG tablet TAKE 1 TABLET BY MOUTH DAILY AT 6 PM. (Patient taking differently: Take 20 mg by mouth daily. ) 30 tablet 6  . carvedilol (COREG) 6.25 MG tablet Take 1 tablet (6.25 mg total) by mouth 2 (two) times daily with a meal. 30 tablet 0  . ENTRESTO 97-103 MG TAKE 1 TABLET BY MOUTH TWO TIMES DAILY. 60 tablet 0  . furosemide (LASIX) 40 MG tablet Take 0.5 tablets (20 mg total) by mouth daily. 30 tablet 3  . Insulin Glargine (LANTUS SOLOSTAR) 100 UNIT/ML Solostar Pen Inject 32 Units into the skin daily at 10 pm.     . metFORMIN (GLUCOPHAGE) 500 MG tablet Take  500 mg by mouth 2 (two) times daily with a meal.    . spironolactone (ALDACTONE) 25 MG tablet TAKE 1/2 TABLET BY MOUTH DAILY. (Patient taking differently: Take 12.5 mg by mouth daily. ) 45 tablet 3  . tadalafil (CIALIS) 20 MG tablet Take 1 tablet (20 mg total) by mouth daily as needed for erectile dysfunction. 10 tablet 0  . warfarin (COUMADIN) 7.5 MG tablet Take 0.5-1 tablets (3.75-7.5 mg total) by mouth See admin instructions. Take 3.75 mg on Monday, Wednesday and Friday.  All other days take 7.5 mg 20 tablet 0   No current facility-administered medications on file prior to encounter.     No Known Allergies  Social History   Socioeconomic History  . Marital status: Single    Spouse name: Not on file  . Number of children: 1  . Years of education: 77  . Highest education level: Not on file  Occupational History  . Occupation: Disabled  Social Needs  . Financial resource strain: Not on file  . Food insecurity:    Worry: Not on file    Inability: Not on file  . Transportation needs:    Medical: Not on file    Non-medical: Not on file  Tobacco Use  . Smoking status: Current Some Day Smoker    Packs/day: 0.25    Years: 31.00    Pack years: 7.75    Types: Cigarettes  . Smokeless tobacco: Never Used  Substance and Sexual Activity  . Alcohol use: Yes    Alcohol/week: 0.0 standard drinks    Comment: 09/21/2014 "might have a drink q 6 /months, if that"  . Drug use: Yes    Types: Cocaine    Comment: 09/21/2014 "last cocaine ~ 1 1/2 months ago"  . Sexual activity: Yes  Lifestyle  . Physical activity:    Days per week: Not on file    Minutes per session: Not on file  . Stress: Not on file  Relationships  . Social connections:    Talks on phone: Not on file    Gets together: Not on file    Attends religious service: Not on file    Active member of club or organization: Not on file    Attends meetings of clubs or organizations: Not on file    Relationship status: Not on file    . Intimate partner violence:    Fear of current or ex partner: Not on file    Emotionally abused: Not on file    Physically abused: Not on file    Forced sexual activity: Not on file  Other Topics Concern  . Not on file  Social History Narrative   Lives in [place] with his mother. No pets. Fun: movies, sports,  daughter.    Denies religious beliefs that would effect health care.             Family History  Problem Relation Age of Onset  . Diabetes Mother   . Heart disease Mother   . Diabetes Father   . Prostate cancer Father   . Heart disease Father   . Diabetes Brother   . Diabetes Brother     Vitals:   12/15/18 0843  Weight: 75 kg (165 lb 6.4 oz)     Wt Readings from Last 3 Encounters:  12/15/18 75 kg (165 lb 6.4 oz)  11/02/18 80.8 kg (178 lb 3.2 oz)  09/21/18 78 kg (172 lb)     PHYSICAL EXAM: General: Well appearing. No resp difficulty. HEENT: Normal Neck: Supple. JVP flat.. Carotids 2+ bilat; no bruits. No thyromegaly or nodule noted. Cor: PMI nondisplaced. RRR, No M/G/R noted Lungs: CTAB, normal effort. Abdomen: Soft, non-tender, non-distended, no HSM. No bruits or masses. +BS  Extremities: No cyanosis, clubbing, or rash. No edema.  Neuro: Alert & orientedx3, cranial nerves grossly intact. moves all 4 extremities w/o difficulty. Affect pleasant   ASSESSMENT & PLAN: 1. Chronic Systolic Heart Failure-Mixed NICM/ICM .  S/P [place]vue Dual Chamber. ICD. - Echo 09/05/2018 LVEF 20-25%.  - Echo 11/02/2018: Ef remains low at 20-25%  - CPX 12/2016 with Moderate HF limitation/restrictive lung physiology. Needs repeat.  - NYHA III symptoms - Volume status stable to dry. - Decrease lasix back to 20 mg daily as planned. Hold today.  - Continue coreg 6.25 mg BID.  Consider increase at next visit  - Continue entresto 97/103 mg BID.  - Increase spironolactone to 25 mg daily.  - Reinforced fluid restriction to < 2 L daily, sodium restriction to less than 2000 mg daily,  and the importance of daily weights.   2. Hx of embolic CVA - INR managed per coumadin clinic.  - Denies bleeding.  3. CAD Coronary angiography (12/2008) with mid LAD totally occluded and collateralized. - Stress test 09/21/18 with no ischemia and prior infarct. HF meds as above.  - No s/s of ischemia.    - Continue atorvastatin 20 mg daily.  - No ASA with stable CAD and use of coumadin  for LV  noncompaction.  4. DMII - Per PCP 5. Polysubstance abuse  - Sober since 09/17/2015. Attends NA regularly and lives with his sponsor.  6. Tobacco Abuse - Encouraged complete cessation.   Shirley Friar, PA-C  12/15/18   Greater than 50% of the 25 minute visit was spent in counseling/coordination of care regarding disease state education, salt/fluid restriction, sliding scale diuretics, and medication compliance.

## 2018-12-15 NOTE — Patient Instructions (Signed)
Lab work done today. We will contact you for any abnormal lab work.  INCREASE Spironolactone 25mg  (1 tab) daily  DECREASE Furosemide 20mg  (0.5 tab) daily  Follow up with Dr. Haroldine Laws in 6-8 weeks.

## 2018-12-17 LAB — CUP PACEART REMOTE DEVICE CHECK
Battery Remaining Longevity: 26 mo
Battery Remaining Percentage: 25 %
Battery Voltage: 2.81 V
Brady Statistic AP VP Percent: 1 %
Brady Statistic AP VS Percent: 1 %
Brady Statistic AS VP Percent: 1 %
Brady Statistic AS VS Percent: 99 %
Brady Statistic RA Percent Paced: 1 %
Brady Statistic RV Percent Paced: 1 %
Date Time Interrogation Session: 20200304134155
HighPow Impedance: 61 Ohm
HighPow Impedance: 61 Ohm
Implantable Lead Implant Date: 20110513
Implantable Lead Implant Date: 20110513
Implantable Lead Location: 753859
Implantable Lead Location: 753860
Implantable Pulse Generator Implant Date: 20110513
Lead Channel Impedance Value: 300 Ohm
Lead Channel Impedance Value: 400 Ohm
Lead Channel Pacing Threshold Amplitude: 0.5 V
Lead Channel Pacing Threshold Amplitude: 0.75 V
Lead Channel Pacing Threshold Pulse Width: 0.5 ms
Lead Channel Pacing Threshold Pulse Width: 0.5 ms
Lead Channel Sensing Intrinsic Amplitude: 12 mV
Lead Channel Sensing Intrinsic Amplitude: 5 mV
Lead Channel Setting Pacing Amplitude: 2 V
Lead Channel Setting Pacing Amplitude: 2.5 V
Lead Channel Setting Pacing Pulse Width: 0.5 ms
Lead Channel Setting Sensing Sensitivity: 0.5 mV
Pulse Gen Serial Number: 608100

## 2018-12-20 ENCOUNTER — Telehealth (HOSPITAL_COMMUNITY): Payer: Self-pay

## 2018-12-20 NOTE — Telephone Encounter (Signed)
Relayed message to pt, verbalized understanding and denied any questions

## 2018-12-20 NOTE — Telephone Encounter (Signed)
-----   Message from Conrad Follansbee, NP sent at 12/20/2018 12:27 PM EDT ----- CPX Mild-Mod HF limitation. Plan to repeat CPX in 1 year

## 2018-12-22 ENCOUNTER — Encounter: Payer: Self-pay | Admitting: Cardiology

## 2018-12-22 NOTE — Progress Notes (Signed)
Remote ICD transmission.   

## 2018-12-24 ENCOUNTER — Ambulatory Visit (INDEPENDENT_AMBULATORY_CARE_PROVIDER_SITE_OTHER): Payer: Medicare Other | Admitting: Pharmacist

## 2018-12-24 ENCOUNTER — Other Ambulatory Visit: Payer: Self-pay

## 2018-12-24 DIAGNOSIS — I428 Other cardiomyopathies: Secondary | ICD-10-CM

## 2018-12-24 DIAGNOSIS — Z7901 Long term (current) use of anticoagulants: Secondary | ICD-10-CM | POA: Diagnosis not present

## 2018-12-24 LAB — POCT INR: INR: 2.9 (ref 2.0–3.0)

## 2018-12-24 MED ORDER — WARFARIN SODIUM 7.5 MG PO TABS: ORAL_TABLET | ORAL | 0 refills | Status: DC

## 2018-12-24 NOTE — Patient Instructions (Signed)
Description   Continue taking 1 tablet every day except 1/2 tablet on Mondays, Wednesdays, and Fridays.  Recheck in 3 weeks.  Main # [phone] Coumadin Clinic # [phone].

## 2019-01-13 ENCOUNTER — Telehealth: Payer: Self-pay

## 2019-01-13 NOTE — Telephone Encounter (Signed)
LMOM FOR PRESCREEN/DRIVE THRU AND NEED TO RESCHEDULE

## 2019-01-26 ENCOUNTER — Telehealth: Payer: Self-pay | Admitting: Internal Medicine

## 2019-01-26 NOTE — Telephone Encounter (Signed)
Spoke with patient and scheduled f/u INR as missed appt on 01/14/19.

## 2019-01-26 NOTE — Telephone Encounter (Signed)
New message:    Patient calling concering INR appt. Please call patient.

## 2019-01-31 ENCOUNTER — Telehealth: Payer: Self-pay

## 2019-01-31 NOTE — Telephone Encounter (Signed)
lmom for prescreen  

## 2019-02-01 ENCOUNTER — Other Ambulatory Visit: Payer: Self-pay

## 2019-02-01 ENCOUNTER — Encounter: Payer: Medicare Other | Admitting: Internal Medicine

## 2019-02-01 NOTE — Progress Notes (Signed)
Electrophysiology TeleHealth Note   Due to national recommendations of social distancing due to COVID 19, an audio/video telehealth visit is felt to be most appropriate for this patient at this time.  See MyChart message from today for the patient's consent to telehealth for [facility]   Date:  02/01/2019   ID:  Dennis Starr., DOB 1961-10-01, MRN [id number]  Location: patient's home  Provider location: [address] [place]  Evaluation Performed: Follow-up visit  PCP:  System, Pcp Not In  Cardiologist:  No primary care provider on file.   Electrophysiologist:  Dr Rayann Heman  Chief Complaint:     History of Present Illness:    Dennis [name]. is a 58 y.o. male who presents via audio/video conferencing for a telehealth visit today.  Since last being seen in our clinic, the patient reports doing very well.  Today, he denies symptoms of palpitations, chest pain, shortness of breath,  lower extremity edema, dizziness, presyncope, or syncope.  The patient is otherwise without complaint today.  The patient denies symptoms of fevers, chills, cough, or new SOB worrisome for COVID 19.  Past Medical History:  Diagnosis Date  . Automatic implantable cardioverter-defibrillator in situ   . CHF (congestive heart failure) ([place])   . Chronic systolic heart failure (HCC)    a) Mixed ICM/NICM b) RHC (05/2014): RA 2, RV 19/2/3, PA 22/14 (18), PCWP 6, Fick CO/CI: 5.2 / 2.7, PVR 2.3 WU, PA 60% and 64% c) ECHO (05/2014): EF 20-25%, diff HK, akinesis entireanteroseptal myocardium, triv AI, mod MR, LA mod/sev dilated  . Drug abuse and dependence ([place])   . Heart murmur   . High cholesterol   . Ischemic cardiomyopathy    a) Coronary angiography (12/2008) at [facility]: Lmain: nl, LAD mid 100% stenosis with left to left and right-to-left collaterals to the distal LAD; Lcx: nl, RCA nl.    . 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[place])   . Pneumonia 1988  . Sleep apnea    "cleared after T&A"  . Type  II diabetes mellitus ([place])     Past Surgical History:  Procedure Laterality Date  . CARDIAC CATHETERIZATION  05/2014  . CARDIAC DEFIBRILLATOR PLACEMENT  03/2011  . CHOLECYSTECTOMY  1994  . FOREARM FRACTURE SURGERY Left 1980  . FRACTURE SURGERY    . RIGHT HEART CATHETERIZATION N/A 05/30/2014   Procedure: RIGHT HEART CATH;  Surgeon: Jolaine Artist, MD;  Location: [facility] CATH LAB;  Service: Cardiovascular;  Laterality: N/A;  . RIGHT HEART CATHETERIZATION N/A 02/07/2015   Procedure: RIGHT HEART CATH;  Surgeon: Jolaine Artist, MD;  Location: [facility] CATH LAB;  Service: Cardiovascular;  Laterality: N/A;  . TONSILLECTOMY AND ADENOIDECTOMY  ~ 1998    Current Outpatient Medications  Medication Sig Dispense Refill  . atorvastatin (LIPITOR) 20 MG tablet TAKE 1 TABLET BY MOUTH DAILY AT 6 PM. (Patient taking differently: Take 20 mg by mouth daily. ) 30 tablet 6  . carvedilol (COREG) 6.25 MG tablet Take 1 tablet (6.25 mg total) by mouth 2 (two) times daily with a meal. 30 tablet 0  . ENTRESTO 97-103 MG TAKE 1 TABLET BY MOUTH TWO TIMES DAILY. 60 tablet 0  . furosemide (LASIX) 40 MG tablet Take 0.5 tablets (20 mg total) by mouth daily. 45 tablet 3  . Insulin Glargine (LANTUS SOLOSTAR) 100 UNIT/ML Solostar Pen Inject 32 Units into the skin daily at 10 pm.     . metFORMIN (GLUCOPHAGE) 500 MG tablet Take 500 mg by  mouth 2 (two) times daily with a meal.    . spironolactone (ALDACTONE) 25 MG tablet Take 1 tablet (25 mg total) by mouth daily. 90 tablet 3  . tadalafil (CIALIS) 20 MG tablet Take 1 tablet (20 mg total) by mouth daily as needed for erectile dysfunction. 10 tablet 0  . warfarin (COUMADIN) 7.5 MG tablet Take 1/2 or 1 tablet daily as directed by Coumadin clinic 30 tablet 0   No current facility-administered medications for this visit.     Allergies:   Patient has no known allergies.   Social History:  The patient  reports that he has been smoking cigarettes. He has a 7.75 pack-year smoking history.  He has never used smokeless tobacco. He reports current alcohol use. He reports current drug use. Drug: Cocaine.   Family History:  The patient's   family history includes Diabetes in his brother, brother, father, and mother; Heart disease in his father and mother; Prostate cancer in his father.   ROS:  Please see the history of present illness.   All other systems are personally reviewed and negative.    Exam:    Vital Signs:  There were no vitals taken for this visit.  Well appearing, alert and conversant, regular work of breathing,  good skin color Eyes- anicteric, neuro- grossly intact, skin- no apparent rash or lesions or cyanosis, mouth- oral mucosa is pink   Labs/Other Tests and Data Reviewed:    Recent Labs: 09/04/2018: Hemoglobin 12.9; Platelets 244 11/02/2018: B Natriuretic Peptide 386.0 12/15/2018: BUN 23; Creatinine, Ser 1.42; Potassium 4.0; Sodium 137   Wt Readings from Last 3 Encounters:  12/15/18 165 lb 6.4 oz (75 kg)  11/02/18 178 lb 3.2 oz (80.8 kg)  09/21/18 172 lb (78 kg)     Other studies personally reviewed: Additional studies/ records that were reviewed today include:    Review of the above records today demonstrates: as above Prior radiographs:    The patient presents wearable device technology report for my review today. On my review, the patient presents with Apple Watch tracings from   . The tracings reveal    Last device remote is reviewed from [place] PDF dated   which reveals normal device function, no arrhythmias     ASSESSMENT & PLAN:    1.      COVID 19 screen The patient denies symptoms of COVID 19 at this time.  The importance of social distancing was discussed today.  Follow-up:    Next remote:    Current medicines are reviewed at length with the patient today.   The patient does not have concerns regarding his medicines.  The following changes were made today:  none  Labs/ tests ordered today include:   No orders of  the defined types were placed in this encounter.    Patient Risk:  after full review of this patients clinical status, I feel that they are at moderate risk at this time.  Today, I have spent   minutes with the patient with telehealth technology discussing   .    Signed, Cristopher Peru, MD  02/01/2019 10:08 AM      [facility] [address] [place] [address] [phone] (office) [phone] (fax) This encounter was created in error - please disregard.

## 2019-02-02 ENCOUNTER — Ambulatory Visit (INDEPENDENT_AMBULATORY_CARE_PROVIDER_SITE_OTHER): Payer: Medicare Other | Admitting: Pharmacist

## 2019-02-02 ENCOUNTER — Other Ambulatory Visit: Payer: Self-pay

## 2019-02-02 DIAGNOSIS — I428 Other cardiomyopathies: Secondary | ICD-10-CM

## 2019-02-02 DIAGNOSIS — Z7901 Long term (current) use of anticoagulants: Secondary | ICD-10-CM

## 2019-02-02 LAB — POCT INR: INR: 1.4 — AB (ref 2.0–3.0)

## 2019-02-09 ENCOUNTER — Encounter (HOSPITAL_COMMUNITY): Payer: Medicare Other | Admitting: Internal Medicine

## 2019-02-14 ENCOUNTER — Telehealth: Payer: Self-pay

## 2019-02-14 NOTE — Telephone Encounter (Signed)

## 2019-02-16 ENCOUNTER — Other Ambulatory Visit: Payer: Self-pay

## 2019-02-16 ENCOUNTER — Ambulatory Visit (INDEPENDENT_AMBULATORY_CARE_PROVIDER_SITE_OTHER): Payer: Medicare Other | Admitting: *Deleted

## 2019-02-16 ENCOUNTER — Telehealth: Payer: Self-pay | Admitting: Internal Medicine

## 2019-02-16 DIAGNOSIS — I428 Other cardiomyopathies: Secondary | ICD-10-CM

## 2019-02-16 DIAGNOSIS — Z7901 Long term (current) use of anticoagulants: Secondary | ICD-10-CM

## 2019-02-16 LAB — POCT INR: INR: 3 (ref 2.0–3.0)

## 2019-02-16 NOTE — Telephone Encounter (Signed)
New message   [facility] for pt to call and schedule virtual visit with Dr. Lovena Le.

## 2019-02-16 NOTE — Patient Instructions (Signed)
Spoke with pt and advised him to continue taking 1 tablet every day except 1/2 tablet on Mondays, Wednesdays, and Fridays.  Recheck in 3 weeks.  Main # [phone] Coumadin Clinic # [phone].

## 2019-02-25 ENCOUNTER — Other Ambulatory Visit (HOSPITAL_COMMUNITY): Payer: Self-pay | Admitting: Internal Medicine

## 2019-02-25 DIAGNOSIS — I5023 Acute on chronic systolic (congestive) heart failure: Secondary | ICD-10-CM

## 2019-03-02 ENCOUNTER — Other Ambulatory Visit: Payer: Self-pay

## 2019-03-02 ENCOUNTER — Telehealth (INDEPENDENT_AMBULATORY_CARE_PROVIDER_SITE_OTHER): Payer: Medicare Other | Admitting: Internal Medicine

## 2019-03-02 DIAGNOSIS — I5022 Chronic systolic (congestive) heart failure: Secondary | ICD-10-CM | POA: Diagnosis not present

## 2019-03-02 DIAGNOSIS — Z95810 Presence of automatic (implantable) cardiac defibrillator: Secondary | ICD-10-CM | POA: Diagnosis not present

## 2019-03-02 NOTE — Progress Notes (Signed)
Electrophysiology TeleHealth Note   Due to national recommendations of social distancing due to COVID 19, an audio/video telehealth visit is felt to be most appropriate for this patient at this time.  See MyChart message from today for the patient's consent to telehealth for [facility].   Date:  03/02/2019   ID:  Dennis Starr., DOB [birth year], MRN [id number]  Location: patient's home  Provider location: [address], [place]  Evaluation Performed: Follow-up visit  PCP:  System, Pcp Not In  Cardiologist:  No primary care provider on file. Dr. DM Electrophysiologist:  Dr Lovena Le  Chief Complaint:  "I'm doing pretty good."  History of Present Illness:    Dennis Jabreel Chimento. is a 58 y.o. male who presents via audio/video conferencing for a telehealth visit today. He is a pleasant 58 yo man with a non-ischemic CM, chronic systolic heart failure, s/p ICD insertion 9 years ago. He carries a diagnosis of non-compaction and is on coumadin. Since last being seen in our clinic, the patient reports doing very well except he has been diagnosed with COPD. He is still smoking. Today, he denies symptoms of palpitations, chest pain, shortness of breath,  lower extremity edema, dizziness, presyncope, or syncope.  The patient denies symptoms of fevers, chills, cough, or new SOB worrisome for COVID 19.  Past Medical History:  Diagnosis Date  . Automatic implantable cardioverter-defibrillator in situ   . CHF (congestive heart failure) ([place])   . Chronic systolic heart failure (HCC)    a) Mixed ICM/NICM b) RHC (05/2014): RA 2, RV 19/2/3, PA 22/14 (18), PCWP 6, Fick CO/CI: 5.2 / 2.7, PVR 2.3 WU, PA 60% and 64% c) ECHO (05/2014): EF 20-25%, diff HK, akinesis entireanteroseptal myocardium, triv AI, mod MR, LA mod/sev dilated  . Drug abuse and dependence ([place])   . Heart murmur   . High cholesterol   . Ischemic cardiomyopathy    a) Coronary angiography (12/2008) at [facility]: Lmain: nl, LAD mid  100% stenosis with left to left and right-to-left collaterals to the distal LAD; Lcx: nl, RCA nl.    . 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[place])   . Pneumonia 1988  . Sleep apnea    "cleared after T&A"  . Type II diabetes mellitus ([place])     Past Surgical History:  Procedure Laterality Date  . CARDIAC CATHETERIZATION  05/2014  . CARDIAC DEFIBRILLATOR PLACEMENT  03/2011  . CHOLECYSTECTOMY  1994  . FOREARM FRACTURE SURGERY Left 1980  . FRACTURE SURGERY    . RIGHT HEART CATHETERIZATION N/A 05/30/2014   Procedure: RIGHT HEART CATH;  Surgeon: Jolaine Artist, MD;  [facility] C[facility] LAB;  Service: Cardiovascular;  Laterality: N/A;  . RIGHT HEART CATHETERIZATION N/A 02/07/2015   Procedure: RIGHT HEART CATH;  Surgeon: Jolaine Artist, MD;  Location: [facility] CATH LAB;  Service: Cardiovascular;  Laterality: N/A;  . TONSILLECTOMY AND ADENOIDECTOMY  ~ 1998    Current Outpatient Medications  Medication Sig Dispense Refill  . atorvastatin (LIPITOR) 20 MG tablet TAKE 1 TABLET BY MOUTH DAILY AT 6 PM. (Patient taking differently: Take 20 mg by mouth daily. ) 30 tablet 6  . carvedilol (COREG) 6.25 MG tablet TAKE 1 TABLET BY MOUTH 2 TIMES DAILY WITH A MEAL. 60 tablet 3  . ENTRESTO 97-103 MG TAKE 1 TABLET BY MOUTH TWO TIMES DAILY. 60 tablet 0  . furosemide (LASIX) 40 MG tablet Take 0.5 tablets (20 mg total) by mouth daily. 45 tablet 3  . Insulin Glargine (  LANTUS SOLOSTAR) 100 UNIT/ML Solostar Pen Inject 32 Units into the skin daily at 10 pm.     . metFORMIN (GLUCOPHAGE) 500 MG tablet Take 500 mg by mouth 2 (two) times daily with a meal.    . spironolactone (ALDACTONE) 25 MG tablet Take 1 tablet (25 mg total) by mouth daily. 90 tablet 3  . tadalafil (CIALIS) 20 MG tablet Take 1 tablet (20 mg total) by mouth daily as needed for erectile dysfunction. 10 tablet 0  . warfarin (COUMADIN) 7.5 MG tablet Take 1/2 or 1 tablet daily as directed by Coumadin clinic 30 tablet 0   No current facility-administered medications  for this visit.     Allergies:   Patient has no known allergies.   Social History:  The patient  reports that he has been smoking cigarettes. He has a 7.75 pack-year smoking history. He has never used smokeless tobacco. He reports current alcohol use. He reports current drug use. Drug: Cocaine.   Family History:  The patient's  family history includes Diabetes in his brother, brother, father, and mother; Heart disease in his father and mother; Prostate cancer in his father.   ROS:  Please see the history of present illness.   All other systems are personally reviewed and negative.    Exam:    Vital Signs:  There were no vitals taken for this visit.   Labs/Other Tests and Data Reviewed:    Recent Labs: 09/04/2018: Hemoglobin 12.9; Platelets 244 11/02/2018: B Natriuretic Peptide 386.0 12/15/2018: BUN 23; Creatinine, Ser 1.42; Potassium 4.0; Sodium 137   Wt Readings from Last 3 Encounters:  12/15/18 165 lb 6.4 oz (75 kg)  11/02/18 178 lb 3.2 oz (80.8 kg)  09/21/18 172 lb (78 kg)     Other studies personally reviewed: Additional studies/ records that were reviewed today include: CPX reviewed.  Last device remote is reviewed from [place] PDF dated 3/6 which reveals normal device function, no arrhythmias    ASSESSMENT & PLAN:    1.  Chronic systolic heart failure - his symptoms are class 2. He is still working 3 rd shift. He will continue his current meds.  2. Tobacco abuse - I encouraged him to stop smoking. He has been diagnosed with COPD. 3. ICD - His [employer]. Jude device has about 2 years on the battery. 4. COVID 19 screen The patient denies symptoms of COVID 19 at this time.  The importance of social distancing was discussed today.  Follow-up:  6 months with me Next remote: 6/20  Current medicines are reviewed at length with the patient today.   The patient does not have concerns regarding his medicines.  The following changes were made today:  none  Labs/ tests ordered today  include: none No orders of the defined types were placed in this encounter.    Patient Risk:  after full review of this patients clinical status, I feel that they are at moderate risk at this time.  Today, I have spent 15 minutes with the patient with telehealth technology discussing all of the above .    Signed, Cristopher Peru, MD  03/02/2019 9:49 AM     [address] [address] [place]  [place] [address] [phone] (office) [phone] (fax)

## 2019-03-08 ENCOUNTER — Telehealth: Payer: Self-pay

## 2019-03-08 NOTE — Telephone Encounter (Signed)
lmom for prescreen  

## 2019-03-15 NOTE — Telephone Encounter (Signed)

## 2019-03-16 ENCOUNTER — Other Ambulatory Visit: Payer: Self-pay

## 2019-03-16 ENCOUNTER — Encounter: Payer: Medicare Other | Admitting: *Deleted

## 2019-03-16 ENCOUNTER — Ambulatory Visit (INDEPENDENT_AMBULATORY_CARE_PROVIDER_SITE_OTHER): Payer: Medicare Other | Admitting: *Deleted

## 2019-03-16 DIAGNOSIS — Z7901 Long term (current) use of anticoagulants: Secondary | ICD-10-CM

## 2019-03-16 DIAGNOSIS — I428 Other cardiomyopathies: Secondary | ICD-10-CM | POA: Diagnosis not present

## 2019-03-16 LAB — POCT INR: INR: 3 (ref 2.0–3.0)

## 2019-03-16 NOTE — Patient Instructions (Signed)
Description   Continue taking 1 tablet every day except 1/2 tablet on Mondays, Wednesdays, and Fridays.  Recheck in 4 weeks.  Main # [phone] Coumadin Clinic # [phone].

## 2019-03-17 ENCOUNTER — Telehealth: Payer: Self-pay

## 2019-03-17 ENCOUNTER — Other Ambulatory Visit (HOSPITAL_COMMUNITY): Payer: Self-pay | Admitting: Internal Medicine

## 2019-03-17 NOTE — Telephone Encounter (Signed)
Left message for patient to remind of missed remote transmission.  

## 2019-03-25 ENCOUNTER — Encounter: Payer: Self-pay | Admitting: Cardiology

## 2019-04-07 ENCOUNTER — Telehealth: Payer: Self-pay

## 2019-04-07 NOTE — Telephone Encounter (Signed)
lmom for prescreen  

## 2019-05-05 ENCOUNTER — Telehealth: Payer: Self-pay | Admitting: *Deleted

## 2019-05-05 NOTE — Telephone Encounter (Signed)

## 2019-05-09 ENCOUNTER — Encounter (INDEPENDENT_AMBULATORY_CARE_PROVIDER_SITE_OTHER): Payer: Self-pay

## 2019-05-09 ENCOUNTER — Other Ambulatory Visit: Payer: Self-pay

## 2019-05-09 ENCOUNTER — Ambulatory Visit (INDEPENDENT_AMBULATORY_CARE_PROVIDER_SITE_OTHER): Payer: Medicare Other | Admitting: *Deleted

## 2019-05-09 DIAGNOSIS — Z7901 Long term (current) use of anticoagulants: Secondary | ICD-10-CM | POA: Diagnosis not present

## 2019-05-09 DIAGNOSIS — I428 Other cardiomyopathies: Secondary | ICD-10-CM | POA: Diagnosis not present

## 2019-05-09 LAB — POCT INR: INR: 3.7 — AB (ref 2.0–3.0)

## 2019-05-09 NOTE — Patient Instructions (Signed)
Description   Do not take any Coumadin today and take 1/2 tablet tomorrow (since took extra) then continue taking 1 tablet every day except 1/2 tablet on Mondays, Wednesdays, and Fridays.  Recheck in 3 weeks.  Main # [phone] Coumadin Clinic # [phone].

## 2019-05-20 ENCOUNTER — Telehealth: Payer: Self-pay | Admitting: Internal Medicine

## 2019-05-20 NOTE — Telephone Encounter (Signed)
New Message    1. Has your device fired? no  2. Is you device beeping? yes  3. Are you experiencing draining or swelling at device site? no 4. Are you calling to see if we received your device transmission? No   5. Have you passed out? No    Patient states that the battery is buzzing.     Please route to [place]

## 2019-05-20 NOTE — Telephone Encounter (Signed)
Pt states his ICD is beeping, sounds like a alarm. I asked the pt to send a manual transmission with his home monitor. He states he is not home with his monitor and will not be home until tomorrow. I told him as soon as he get home send [place] a transmission to look at that way we can know exactly why he is alarming. The pt verbalized understanding.

## 2019-05-23 ENCOUNTER — Ambulatory Visit (INDEPENDENT_AMBULATORY_CARE_PROVIDER_SITE_OTHER): Payer: Medicare Other | Admitting: *Deleted

## 2019-05-23 DIAGNOSIS — I255 Ischemic cardiomyopathy: Secondary | ICD-10-CM

## 2019-05-23 NOTE — Telephone Encounter (Signed)
Transmission received 05-23-2019

## 2019-05-23 NOTE — Telephone Encounter (Signed)
LMOM to call clinic.  Transmission received 05/23/19 and alert was for a lead impedance of 280 ohms . Has been as low as 260 ohms in past.   Schedule for DC appointment to evaluate.

## 2019-05-24 LAB — CUP PACEART REMOTE DEVICE CHECK
Battery Remaining Longevity: 12 mo
Battery Remaining Percentage: 11 %
Battery Voltage: 2.69 V
Brady Statistic AP VP Percent: 1 %
Brady Statistic AP VS Percent: 1 %
Brady Statistic AS VP Percent: 1 %
Brady Statistic AS VS Percent: 99 %
Brady Statistic RA Percent Paced: 1 %
Brady Statistic RV Percent Paced: 1 %
Date Time Interrogation Session: 20200810132517
HighPow Impedance: 63 Ohm
HighPow Impedance: 63 Ohm
Implantable Lead Implant Date: 20110513
Implantable Lead Implant Date: 20110513
Implantable Lead Location: 753859
Implantable Lead Location: 753860
Implantable Pulse Generator Implant Date: 20110513
Lead Channel Impedance Value: 280 Ohm
Lead Channel Impedance Value: 390 Ohm
Lead Channel Pacing Threshold Amplitude: 0.5 V
Lead Channel Pacing Threshold Amplitude: 0.75 V
Lead Channel Pacing Threshold Pulse Width: 0.5 ms
Lead Channel Pacing Threshold Pulse Width: 0.5 ms
Lead Channel Sensing Intrinsic Amplitude: 12 mV
Lead Channel Sensing Intrinsic Amplitude: 5 mV
Lead Channel Setting Pacing Amplitude: 2 V
Lead Channel Setting Pacing Amplitude: 2.5 V
Lead Channel Setting Pacing Pulse Width: 0.5 ms
Lead Channel Setting Sensing Sensitivity: 0.5 mV
Pulse Gen Serial Number: 608100

## 2019-05-25 NOTE — Telephone Encounter (Signed)
Reviewed transmission with [employer] rep, RA impedance <100ohms on ~8/4 (triggered alert for <200ohms), RA impedance chronically running around 260-280ohms. 2088 RA lead, some AMS EGMs show RA lead noise, atrial noise reversions also noted. Industry recommendation is to reprogram RA impedance lower limit to 150ohms for more accurate trend, turn patient alert off for low impedance, and assess isometrics.   Spoke with patient. Confirmed that vibratory alert is automatically off due to successful transmission on 8/10. Pt is agreeable to DC appointment on 05/31/19 at 2:30pm after his coumadin appointment at 2:15pm. No further questions at this time.

## 2019-05-30 ENCOUNTER — Telehealth: Payer: Self-pay

## 2019-05-30 NOTE — Telephone Encounter (Signed)
    COVID-19 Pre-Screening Questions:  . In the past 7 to 10 days have you had a cough,  shortness of breath, headache, congestion, fever (100 or greater) body aches, chills, sore throat, or sudden loss of taste or sense of smell? No . Have you been around anyone with known Covid 19. No . Have you been around anyone who is awaiting Covid 19 test results in the past 7 to 10 days? No . Have you been around anyone who has been exposed to Covid 19, or has mentioned symptoms of Covid 19 within the past 7 to 10 days? No  If you have any concerns/questions about symptoms patients report during screening (either on the phone or at threshold). Contact the provider seeing the patient or DOD for further guidance.  If neither are available contact a member of the leadership team.          Pt answered No to all Covid-19 prescreening questions. I asked the pt to wear a mask to his appointment. I let him know that we are reducing the number of people coming into the office and if he can physically come alone then to please do so.

## 2019-05-31 ENCOUNTER — Other Ambulatory Visit: Payer: Self-pay

## 2019-05-31 ENCOUNTER — Encounter: Payer: Self-pay | Admitting: Cardiology

## 2019-05-31 ENCOUNTER — Ambulatory Visit (INDEPENDENT_AMBULATORY_CARE_PROVIDER_SITE_OTHER): Payer: Medicare Other | Admitting: *Deleted

## 2019-05-31 DIAGNOSIS — I255 Ischemic cardiomyopathy: Secondary | ICD-10-CM

## 2019-05-31 DIAGNOSIS — I428 Other cardiomyopathies: Secondary | ICD-10-CM

## 2019-05-31 DIAGNOSIS — Z7901 Long term (current) use of anticoagulants: Secondary | ICD-10-CM | POA: Diagnosis not present

## 2019-05-31 LAB — CUP PACEART INCLINIC DEVICE CHECK
Battery Remaining Longevity: 15 mo
Brady Statistic RA Percent Paced: 0 %
Brady Statistic RV Percent Paced: 0 %
Date Time Interrogation Session: 20200818145454
HighPow Impedance: 54 Ohm
Implantable Lead Implant Date: 20110513
Implantable Lead Implant Date: 20110513
Implantable Lead Location: 753859
Implantable Lead Location: 753860
Implantable Pulse Generator Implant Date: 20110513
Lead Channel Impedance Value: 275 Ohm
Lead Channel Impedance Value: 387.5 Ohm
Lead Channel Pacing Threshold Amplitude: 0.5 V
Lead Channel Pacing Threshold Amplitude: 0.5 V
Lead Channel Pacing Threshold Amplitude: 0.75 V
Lead Channel Pacing Threshold Amplitude: 0.75 V
Lead Channel Pacing Threshold Pulse Width: 0.5 ms
Lead Channel Pacing Threshold Pulse Width: 0.5 ms
Lead Channel Pacing Threshold Pulse Width: 0.5 ms
Lead Channel Pacing Threshold Pulse Width: 0.5 ms
Lead Channel Sensing Intrinsic Amplitude: 12 mV
Lead Channel Sensing Intrinsic Amplitude: 5 mV
Lead Channel Setting Pacing Amplitude: 2 V
Lead Channel Setting Pacing Amplitude: 2.5 V
Lead Channel Setting Pacing Pulse Width: 0.5 ms
Lead Channel Setting Sensing Sensitivity: 0.5 mV
Pulse Gen Serial Number: 608100

## 2019-05-31 LAB — POCT INR: INR: 2.4 (ref 2.0–3.0)

## 2019-05-31 NOTE — Progress Notes (Signed)
ICD check in clinic. Normal device function. Thresholds and sensing consistent with previous device measurements. RA impedance recently dropped below 200. 240 AMS episodes, available EGMs show noise on RA lead. Thresholds stable. RA impedance lower limit decreased to 150ohms per industry. Could not recreate noise w/ isometrics.  No evidence of any ventricular arrhythmias. Histogram distribution appropriate for patient and level of activity. Device programmed at appropriate safety margins. Estimated longevity 1.3 years. Pt enrolled in remote follow-up 08/22/19.

## 2019-05-31 NOTE — Patient Instructions (Signed)
Description   Continue taking 1 tablet every day except 1/2 tablet on Mondays, Wednesdays, and Fridays.  Recheck in 4 weeks.  Main # [id number] Cou[facility]in Clinic # [phone].

## 2019-05-31 NOTE — Progress Notes (Signed)
Remote ICD transmission.   

## 2019-06-28 ENCOUNTER — Ambulatory Visit (INDEPENDENT_AMBULATORY_CARE_PROVIDER_SITE_OTHER): Payer: Medicare Other | Admitting: *Deleted

## 2019-06-28 ENCOUNTER — Other Ambulatory Visit: Payer: Self-pay

## 2019-06-28 DIAGNOSIS — I428 Other cardiomyopathies: Secondary | ICD-10-CM

## 2019-06-28 DIAGNOSIS — Z7901 Long term (current) use of anticoagulants: Secondary | ICD-10-CM | POA: Diagnosis not present

## 2019-06-28 LAB — POCT INR: INR: 1.8 — AB (ref 2.0–3.0)

## 2019-06-28 NOTE — Patient Instructions (Signed)
Description   Take an extra 1/2 tablet today, then continue taking 1 tablet every day except 1/2 tablet on Mondays, Wednesdays, and Fridays.  Recheck in 3 weeks.  Main # [phone] Cou[facility]in Clinic # [phone].

## 2019-08-05 ENCOUNTER — Ambulatory Visit (HOSPITAL_COMMUNITY)
Admission: RE | Admit: 2019-08-05 | Discharge: 2019-08-05 | Disposition: A | Discharge status: Home / Self Care | Payer: Medicare Other | Source: Ambulatory Visit | Attending: Internal Medicine | Admitting: Internal Medicine

## 2019-08-05 ENCOUNTER — Other Ambulatory Visit: Payer: Self-pay

## 2019-08-05 VITALS — BP 109/74 | HR 90 | Wt 163.4 lb

## 2019-08-05 DIAGNOSIS — F17210 Nicotine dependence, cigarettes, uncomplicated: Secondary | ICD-10-CM | POA: Insufficient documentation

## 2019-08-05 DIAGNOSIS — I255 Ischemic cardiomyopathy: Secondary | ICD-10-CM | POA: Diagnosis not present

## 2019-08-05 DIAGNOSIS — Z9119 Patient's noncompliance with other medical treatment and regimen: Secondary | ICD-10-CM | POA: Insufficient documentation

## 2019-08-05 DIAGNOSIS — Z79899 Other long term (current) drug therapy: Secondary | ICD-10-CM | POA: Insufficient documentation

## 2019-08-05 DIAGNOSIS — Z8249 Family history of ischemic heart disease and other diseases of the circulatory system: Secondary | ICD-10-CM | POA: Insufficient documentation

## 2019-08-05 DIAGNOSIS — Z7901 Long term (current) use of anticoagulants: Secondary | ICD-10-CM | POA: Insufficient documentation

## 2019-08-05 DIAGNOSIS — E119 Type 2 diabetes mellitus without complications: Secondary | ICD-10-CM | POA: Diagnosis not present

## 2019-08-05 DIAGNOSIS — E7800 Pure hypercholesterolemia, unspecified: Secondary | ICD-10-CM | POA: Insufficient documentation

## 2019-08-05 DIAGNOSIS — I2510 Atherosclerotic heart disease of native coronary artery without angina pectoris: Secondary | ICD-10-CM | POA: Diagnosis not present

## 2019-08-05 DIAGNOSIS — I5022 Chronic systolic (congestive) heart failure: Secondary | ICD-10-CM | POA: Diagnosis present

## 2019-08-05 DIAGNOSIS — I428 Other cardiomyopathies: Secondary | ICD-10-CM | POA: Insufficient documentation

## 2019-08-05 DIAGNOSIS — Z8673 Personal history of transient ischemic attack (TIA), and cerebral infarction without residual deficits: Secondary | ICD-10-CM | POA: Diagnosis not present

## 2019-08-05 DIAGNOSIS — Z794 Long term (current) use of insulin: Secondary | ICD-10-CM | POA: Insufficient documentation

## 2019-08-05 DIAGNOSIS — I5023 Acute on chronic systolic (congestive) heart failure: Secondary | ICD-10-CM

## 2019-08-05 DIAGNOSIS — Z95810 Presence of automatic (implantable) cardiac defibrillator: Secondary | ICD-10-CM | POA: Diagnosis not present

## 2019-08-05 LAB — CBC
HCT: 42.7 % (ref 39.0–52.0)
Hemoglobin: 13.4 g/dL (ref 13.0–17.0)
MCH: 30 pg (ref 26.0–34.0)
MCHC: 31.4 g/dL (ref 30.0–36.0)
MCV: 95.7 fL (ref 80.0–100.0)
Platelets: 227 10*3/uL (ref 150–400)
RBC: 4.46 MIL/uL (ref 4.22–5.81)
RDW: 12.8 % (ref 11.5–15.5)
WBC: 10.2 10*3/uL (ref 4.0–10.5)
nRBC: 0 % (ref 0.0–0.2)

## 2019-08-05 LAB — BRAIN NATRIURETIC PEPTIDE: B Natriuretic Peptide: 171.2 pg/mL — ABNORMAL HIGH (ref 0.0–100.0)

## 2019-08-05 LAB — BASIC METABOLIC PANEL
Anion gap: 10 (ref 5–15)
BUN: 22 mg/dL — ABNORMAL HIGH (ref 6–20)
CO2: 23 mmol/L (ref 22–32)
Calcium: 8.9 mg/dL (ref 8.9–10.3)
Chloride: 103 mmol/L (ref 98–111)
Creatinine, Ser: 1.51 mg/dL — ABNORMAL HIGH (ref 0.61–1.24)
GFR calc Af Amer: 59 mL/min — ABNORMAL LOW (ref >60)
GFR calc non Af Amer: 51 mL/min — ABNORMAL LOW (ref >60)
Glucose, Bld: 236 mg/dL — ABNORMAL HIGH (ref 70–99)
Potassium: 4.2 mmol/L (ref 3.5–5.1)
Sodium: 136 mmol/L (ref 135–145)

## 2019-08-05 MED ORDER — CARVEDILOL 6.25 MG PO TABS: 9.3750 mg | ORAL_TABLET | Freq: Two times a day (BID) | ORAL | 3 refills | Status: DC

## 2019-08-05 MED ORDER — EMPAGLIFLOZIN 10 MG PO TABS: 10.0000 mg | ORAL_TABLET | Freq: Every day | ORAL | 6 refills | Status: DC

## 2019-08-05 NOTE — Patient Instructions (Signed)
CHANGE HOW YOU TAKE CARVEDILOL:  FOR THE NEXT 2 WEEKS,  take Carvedilol 6.25mg  (1 tab) in the morning AND take 9.375mg  (1.5 tabs) in the evening  AFTER 2 WEEKS, take 9.375mg  (1.5 tabs) twice a day  PLEASE CALL OUR OFFICE IF YOU CANNOT TOLERATE THE INCREASE IN DOSE OF CARVEDILOL  START Jardiance 10mg  (1 tab) daily  Labs today We will only contact you if something comes back abnormal or we need to make some changes. Otherwise no news is good news!  Your physician recommends that you schedule a follow-up appointment in: 6 months with Dr Haroldine Laws.  Our office will call you to schedule this appointment. Please call us if you do not receive this call.   At the [facility], you and your health needs are our priority. As part of our continuing mission to provide you with exceptional heart care, we have created designated Provider Care Teams. These Care Teams include your primary Cardiologist (physician) and Advanced Practice Providers (APPs- Physician Assistants and Nurse Practitioners) who all work together to provide you with the care you need, when you need it.   You may see any of the following providers on your designated Care Team at your next follow up: Marland Kitchen Dr Glori Bickers . Dr Loralie Champagne . Darrick Grinder, NP . Lyda Jester, PA   Please be sure to bring in all your medications bottles to every appointment.

## 2019-08-05 NOTE — Progress Notes (Signed)
Patient ID: Dennis Lemen., male   DOB: 03-26-1961, 58 y.o.   MRN: [id number]      Advanced Heart Failure Clinic Note  HF MD: Dr Haroldine Laws PCP: [facility] EP: Dr Lovena Le   HPI: Dennis [name]. is a 58 y.o. male  with h/o DM2, polysubstance use (tobacco, cocaine) last sused 2017, mixed ICM/NICM and chronic systolic HF due to ventricular noncompaction- on chronic coumadin, noncompliance   Admitted to [facility] in 4/16 with acute CVA and with L sided weakness. CT showed R MCA stroke. Did not receive TPA due to delay in arrival. Had ECHO that showed EF 10-15% mild RV HK. No obvious LV clot. As he improved he was transferred to CIR but then developed hypotension and transferred back to HF ward. Underwent RHC on 4/27. Filling pressures mildly elevated CO ok. Diuresed gently. Meds limited by SBP in 80s. Could only tolerate ivabradine 7.5 bid, digoxin and spiro 12.5. Lasix and carvedilol stopped.   Admitted in November with CP. Troponin negative, scheduled for stress test. Had Myoview 09/21/18 which showed no ischemia, but prior infarct, noted high risk due to low EF.   He presents today for regular follow up. Says he feels pretty feeling. 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 After work goes to walk for 20 mins in the park. For the most part can walk without stopping but occasionally has to stop.  No edema, orthopnea or PND. Off lasix. Taking spiro 25.   Echo 10/2018 LVEF 20-25%, Grade 2 DD.   Echo 2016 EF 15%.  Echo (9/17): EF 25-30%, moderate LV dilation with prominent trabeculation, normal RV size and systolic function.  Echo 08/12/17 EF 45-50%, much improved from previous  Per Dr Vaughan Browner  [place] 02/07/15 RA = 3 RV = 50/4/7 PA = 55/14 (34) PCW = 22 Fick cardiac output/index = 4.6/2.4 Thermodilution CO/CI = 4.8/2.5 PVR = 2.5 WU SVR = 1443 FA sat = 96% PA sat = 51%, 56%, 61%  Noninvasive Ao pressure = 106/74 (86)   CPX 02/07/2016 Peak VO2: 18.7 (54% predicted peak VO2) VE/VCO2 slope: 30  OUES: 1.62 Peak RER:  1.13 Ventilatory Threshold: 10.9 (32% predicted or measured peak VO2)  CPX 12/24/16 Peak VO2 19.6 (58% predicted peak VO2) VE/VCO2 slope: 31 OUES: 1.79 Peak RER 1.09 Ventilatory threshold: 14.9 (44% predicted or measure peak VO2)   CPX 3/20 FVC 3.33 (76%)    FEV1 2.54 (75%)     FEV1/FVC 76 (97%)     MVV 116 (76%)   Resting HR: 94 Peak HR: 122  (74% age predicted max HR)  BP rest: 94/64 BP peak: 114/62  Peak VO2: 19.9 (60% predicted peak VO2)  VE/VCO2 slope: 31  OUES: 2.07  Peak RER: 1.09  Ventilatory Threshold: 15.3 (46% predicted or 77% measured peak VO2)  VE/MVV: 50%  O2pulse: 12  (80% predicted O2pulse)   Review of systems complete and found to be negative unless listed in HPI.    Past Medical History:  Diagnosis Date  . Automatic implantable cardioverter-defibrillator in situ   . CHF (congestive heart failure) ([place])   . Chronic systolic heart failure (HCC)    a) Mixed ICM/NICM b) RHC (05/2014): RA 2, RV 19/2/3, PA 22/14 (18), PCWP 6, Fick CO/CI: 5.2 / 2.7, PVR 2.3 WU, PA 60% and 64% c) ECHO (05/2014): EF 20-25%, diff HK, akinesis entireanteroseptal myocardium, triv AI, mod MR, LA mod/sev dilated  . Drug abuse and dependence ([place])   . Heart murmur   .  High cholesterol   . Ischemic cardiomyopathy    a) Coronary angiography (12/2008) at [facility]: Lmain: nl, LAD mid 100% stenosis with left to left and right-to-left collaterals to the distal LAD; Lcx: nl, RCA nl.    . 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[place])   . Pneumonia 1988  . Sleep apnea    "cleared after T&A"  . Type II diabetes mellitus ([place])      Current Outpatient Medications on File Prior to Encounter  Medication Sig Dispense Refill  . atorvastatin (LIPITOR) 20 MG tablet TAKE 1 TABLET BY MOUTH DAILY AT 6 PM. 30 tablet 6  . carvedilol (COREG) 6.25 MG tablet TAKE 1 TABLET BY MOUTH 2 TIMES DAILY WITH A MEAL. 60 tablet 3  . ENTRESTO 97-103 MG TAKE 1 TABLET BY MOUTH TWO TIMES DAILY.  60 tablet 0  . Insulin Glargine (LANTUS SOLOSTAR) 100 UNIT/ML Solostar Pen Inject 32 Units into the skin daily at 10 pm.     . metFORMIN (GLUCOPHAGE) 500 MG tablet Take 500 mg by mouth 2 (two) times daily with a meal.    . spironolactone (ALDACTONE) 25 MG tablet Take 1 tablet (25 mg total) by mouth daily. 90 tablet 3  . tadalafil (CIALIS) 20 MG tablet Take 1 tablet (20 mg total) by mouth daily as needed for erectile dysfunction. 10 tablet 0  . warfarin (COUMADIN) 7.5 MG tablet Take 1/2 or 1 tablet daily as directed by Coumadin clinic 30 tablet 0   No current facility-administered medications on file prior to encounter.     No Known Allergies  Social History   Socioeconomic History  . Marital status: Single    Spouse name: Not on file  . Number of children: 1  . Years of education: 65  . Highest education level: Not on file  Occupational History  . Occupation: Disabled  Social Needs  . Financial resource strain: Not on file  . Food insecurity    Worry: Not on file    Inability: Not on file  . Transportation needs    Medical: Not on file    Non-medical: Not on file  Tobacco Use  . Smoking status: Current Some Day Smoker    Packs/day: 0.25    Years: 31.00    Pack years: 7.75    Types: Cigarettes  . Smokeless tobacco: Never Used  Substance and Sexual Activity  . Alcohol use: Yes    Alcohol/week: 0.0 standard drinks    Comment: 09/21/2014 "might have a drink q 6 /months, if that"  . Drug use: Yes    Types: Cocaine    Comment: 09/21/2014 "last cocaine ~ 1 1/2 months ago"  . Sexual activity: Yes  Lifestyle  . Physical activity    Days per week: Not on file    Minutes per session: Not on file  . Stress: Not on file  Relationships  . Social Herbalist on phone: Not on file    Gets together: Not on file    Attends religious service: Not on file    Active member of club or organization: Not on file    Attends meetings of clubs or organizations: Not on file     Relationship status: Not on file  . Intimate partner violence    Fear of current or ex partner: Not on file    Emotionally abused: Not on file    Physically abused: Not on file    Forced sexual activity: Not on file  Other Topics Concern  .  Not on file  Social History Narrative   Lives in [place] with his mother. No pets. Fun: movies, sports, daughter.    Denies religious beliefs that would effect health care.             Family History  Problem Relation Age of Onset  . Diabetes Mother   . Heart disease Mother   . Diabetes Father   . Prostate cancer Father   . Heart disease Father   . Diabetes Brother   . Diabetes Brother     Vitals:   08/05/19 0923  BP: 109/74  Pulse: 90  SpO2: 97%  Weight: 74.1 kg (163 lb 6.4 oz)     Wt Readings from Last 3 Encounters:  08/05/19 74.1 kg (163 lb 6.4 oz)  12/15/18 75 kg (165 lb 6.4 oz)  11/02/18 80.8 kg (178 lb 3.2 oz)     PHYSICAL EXAM: General:  Well appearing. No resp difficulty HEENT: normal Neck: supple. no JVD. Carotids 2+ bilat; no bruits. No lymphadenopathy or thryomegaly appreciated. Cor: PMI nondisplaced. Regular rate & rhythm. No rubs, gallops or murmurs. Lungs: clear Abdomen: soft, nontender, nondistended. No hepatosplenomegaly. No bruits or masses. Good bowel sounds. Extremities: no cyanosis, clubbing, rash, edema Neuro: alert & orientedx3, cranial nerves grossly intact. moves all 4 extremities w/o difficulty. Affect pleasant  ASSESSMENT & PLAN: 1. Chronic Systolic Heart Failure-Mixed NICM/ICM .  - S/P [employer] Dual Chamber. ICD. - Echo 09/05/2018 LVEF 20-25%.  - Echo 11/02/2018: EF remains low at 20-25%  - CPX 12/2018. Moderate HF limitation. Stable/slightly improved - NYHA II-III symptoms  - Volume status stable to dry. - No longer taking lasix - Continue coreg 6.25 mg BID.  Increase to 6.25/9.375 x 2 weeks. Then 9.375 bid - Continue entresto 97/103 mg BID.  - Continue  spironolactone to 25 mg daily.  - Add  Jardiance - Check labs - Reinforced fluid restriction to < 2 L daily, sodium restriction to less than 2000 mg daily, and the importance of daily weights.   2. Hx of embolic CVA - INR managed per coumadin clinic.  - No bleeding. Check CBC 3. CAD Coronary angiography (12/2008) with mid LAD totally occluded and collateralized. - Stress test 09/21/18 with no ischemia and prior infarct. HF meds as above.  - No s/s of ischemia - Continue atorvastatin 20 mg daily.  - No ASA with stable CAD and use of coumadin  for LV  noncompaction.  4. DMII - Per PCP - Start Jardiance 5. Polysubstance abuse  - Sober since 09/17/2015. Attends [employer] regularly and lives with his sponsor.  6. Tobacco Abuse - Smoking 1/2 ppd. Encouraged complete cessation.   Glori Bickers, MD  08/05/19

## 2019-08-22 ENCOUNTER — Encounter: Payer: Medicare Other | Admitting: *Deleted

## 2019-08-24 ENCOUNTER — Telehealth: Payer: Self-pay

## 2019-08-24 NOTE — Telephone Encounter (Signed)
Left message for patient to remind of missed remote transmission. LL

## 2019-08-31 ENCOUNTER — Telehealth: Payer: Self-pay

## 2019-08-31 NOTE — Telephone Encounter (Signed)
lmom for overdue inr 

## 2019-09-19 ENCOUNTER — Ambulatory Visit (INDEPENDENT_AMBULATORY_CARE_PROVIDER_SITE_OTHER): Payer: Medicare Other | Admitting: *Deleted

## 2019-09-19 DIAGNOSIS — Z95810 Presence of automatic (implantable) cardiac defibrillator: Secondary | ICD-10-CM | POA: Diagnosis not present

## 2019-09-19 LAB — CUP PACEART REMOTE DEVICE CHECK
Battery Remaining Longevity: 16 mo
Battery Remaining Percentage: 14 %
Battery Voltage: 2.72 V
Brady Statistic AP VP Percent: 1 %
Brady Statistic AP VS Percent: 1 %
Brady Statistic AS VP Percent: 1 %
Brady Statistic AS VS Percent: 99 %
Brady Statistic RA Percent Paced: 1 %
Brady Statistic RV Percent Paced: 1 %
Date Time Interrogation Session: 20201206092258
HighPow Impedance: 59 Ohm
HighPow Impedance: 59 Ohm
Implantable Lead Implant Date: 20110513
Implantable Lead Implant Date: 20110513
Implantable Lead Location: 753859
Implantable Lead Location: 753860
Implantable Pulse Generator Implant Date: 20110513
Lead Channel Impedance Value: 290 Ohm
Lead Channel Impedance Value: 360 Ohm
Lead Channel Pacing Threshold Amplitude: 0.5 V
Lead Channel Pacing Threshold Amplitude: 0.75 V
Lead Channel Pacing Threshold Pulse Width: 0.5 ms
Lead Channel Pacing Threshold Pulse Width: 0.5 ms
Lead Channel Sensing Intrinsic Amplitude: 12 mV
Lead Channel Sensing Intrinsic Amplitude: 5 mV
Lead Channel Setting Pacing Amplitude: 2 V
Lead Channel Setting Pacing Amplitude: 2.5 V
Lead Channel Setting Pacing Pulse Width: 0.5 ms
Lead Channel Setting Sensing Sensitivity: 0.5 mV
Pulse Gen Serial Number: 608100

## 2019-10-06 ENCOUNTER — Other Ambulatory Visit (HOSPITAL_COMMUNITY): Payer: Self-pay | Admitting: Internal Medicine

## 2019-10-07 ENCOUNTER — Other Ambulatory Visit: Payer: Self-pay

## 2019-10-07 ENCOUNTER — Emergency Department (HOSPITAL_COMMUNITY): Payer: Medicare Other

## 2019-10-07 ENCOUNTER — Emergency Department (HOSPITAL_COMMUNITY)
Admission: EM | Admit: 2019-10-07 | Discharge: 2019-10-07 | Discharge status: Against Medical Advice | Payer: Medicare Other | Attending: Emergency Medicine | Admitting: Emergency Medicine

## 2019-10-07 DIAGNOSIS — R0602 Shortness of breath: Secondary | ICD-10-CM | POA: Diagnosis present

## 2019-10-07 DIAGNOSIS — Z5321 Procedure and treatment not carried out due to patient leaving prior to being seen by health care provider: Secondary | ICD-10-CM | POA: Diagnosis not present

## 2019-10-07 LAB — CBC
HCT: 39.3 % (ref 39.0–52.0)
Hemoglobin: 12 g/dL — ABNORMAL LOW (ref 13.0–17.0)
MCH: 29.9 pg (ref 26.0–34.0)
MCHC: 30.5 g/dL (ref 30.0–36.0)
MCV: 97.8 fL (ref 80.0–100.0)
Platelets: 249 10*3/uL (ref 150–400)
RBC: 4.02 MIL/uL — ABNORMAL LOW (ref 4.22–5.81)
RDW: 13.3 % (ref 11.5–15.5)
WBC: 10.6 10*3/uL — ABNORMAL HIGH (ref 4.0–10.5)
nRBC: 0 % (ref 0.0–0.2)

## 2019-10-07 LAB — BASIC METABOLIC PANEL
Anion gap: 10 (ref 5–15)
BUN: 12 mg/dL (ref 6–20)
CO2: 24 mmol/L (ref 22–32)
Calcium: 9 mg/dL (ref 8.9–10.3)
Chloride: 106 mmol/L (ref 98–111)
Creatinine, Ser: 1.28 mg/dL — ABNORMAL HIGH (ref 0.61–1.24)
GFR calc Af Amer: >60 mL/min (ref >60)
GFR calc non Af Amer: >60 mL/min (ref >60)
Glucose, Bld: 233 mg/dL — ABNORMAL HIGH (ref 70–99)
Potassium: 4.1 mmol/L (ref 3.5–5.1)
Sodium: 140 mmol/L (ref 135–145)

## 2019-10-07 IMAGING — CR DG CHEST 2V
2 series · 2 of 2 positions shown · non-contrast
Comparison: [DATE]

CLINICAL DATA: Shortness of breath today

EXAM:
CHEST - 2 VIEW

[chest pa]
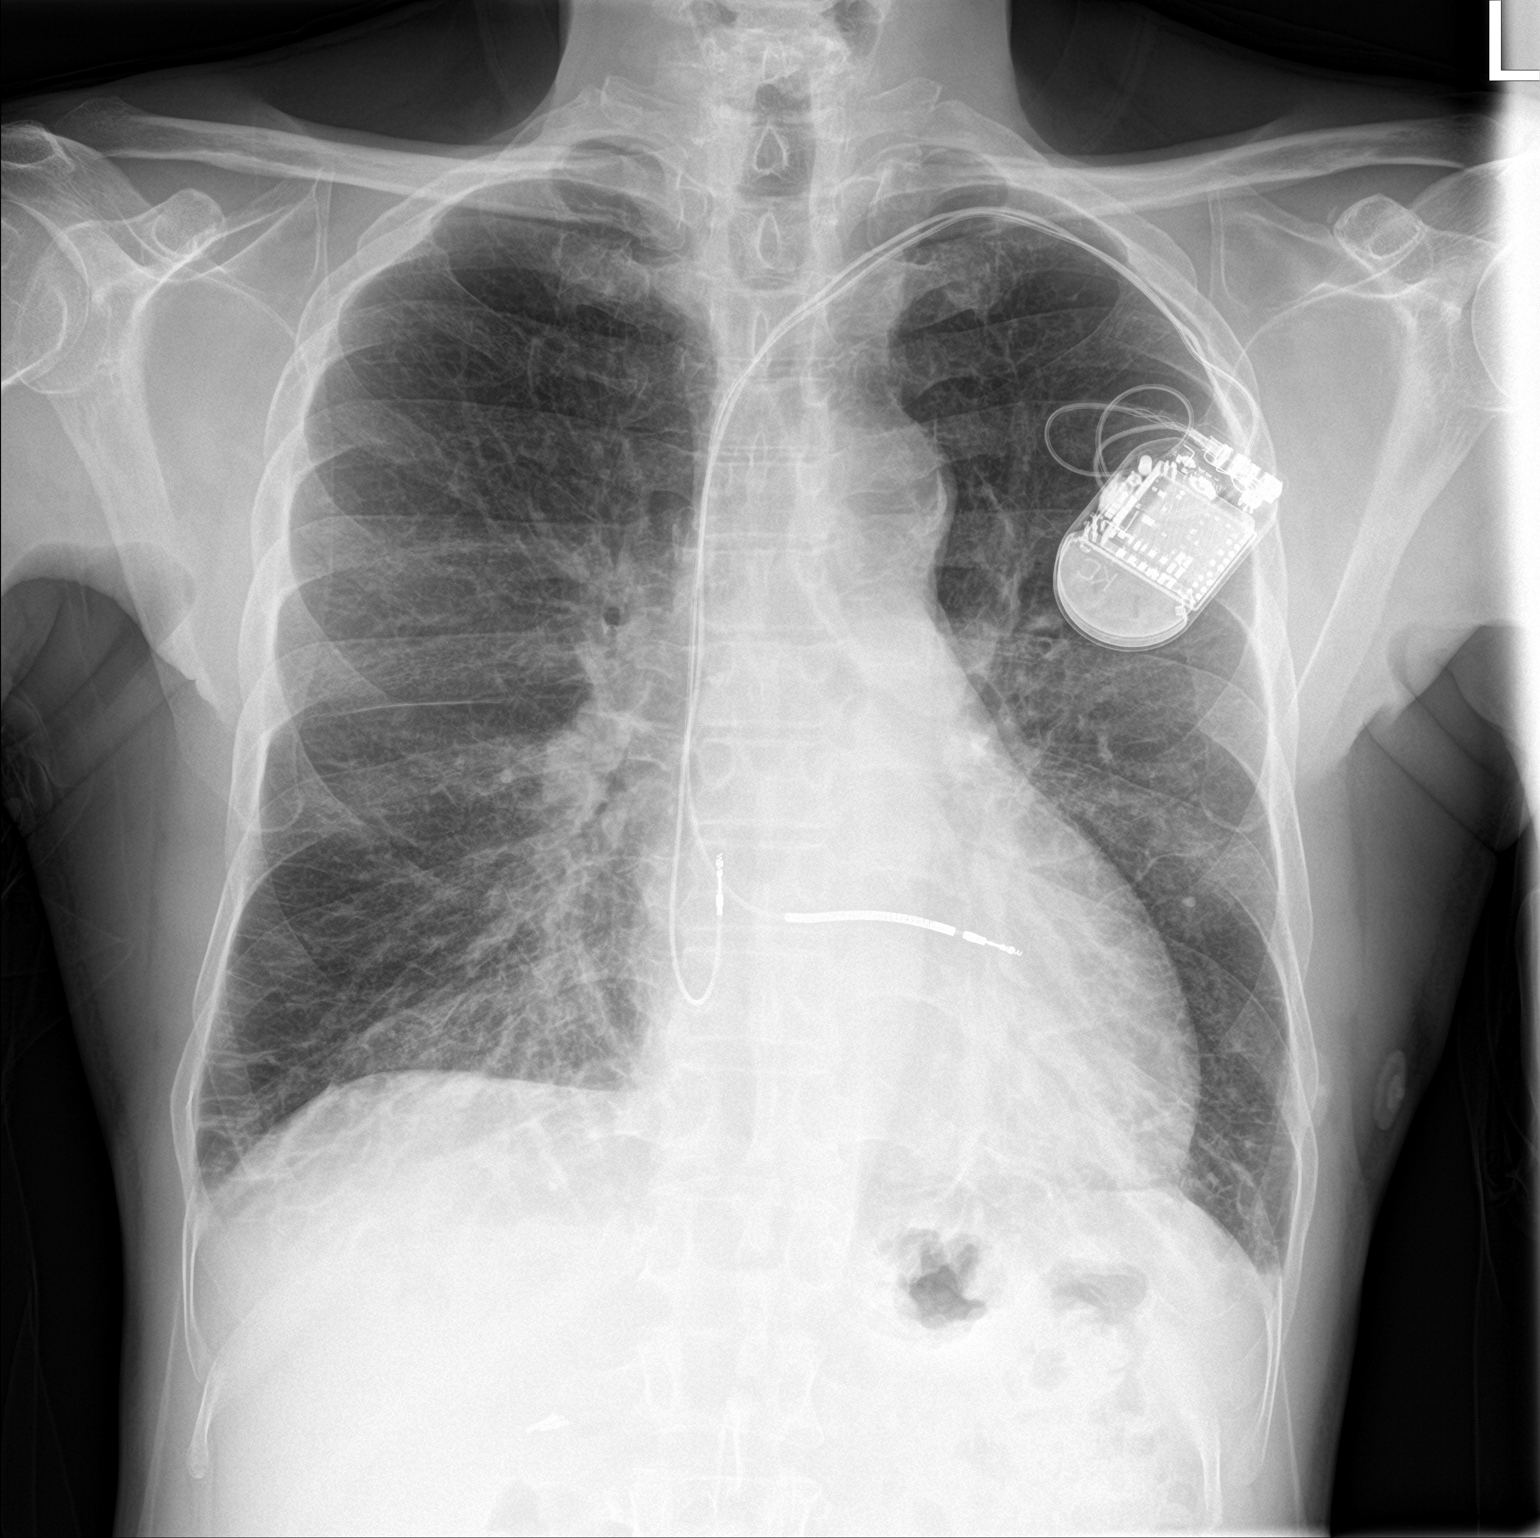

[chest lat]
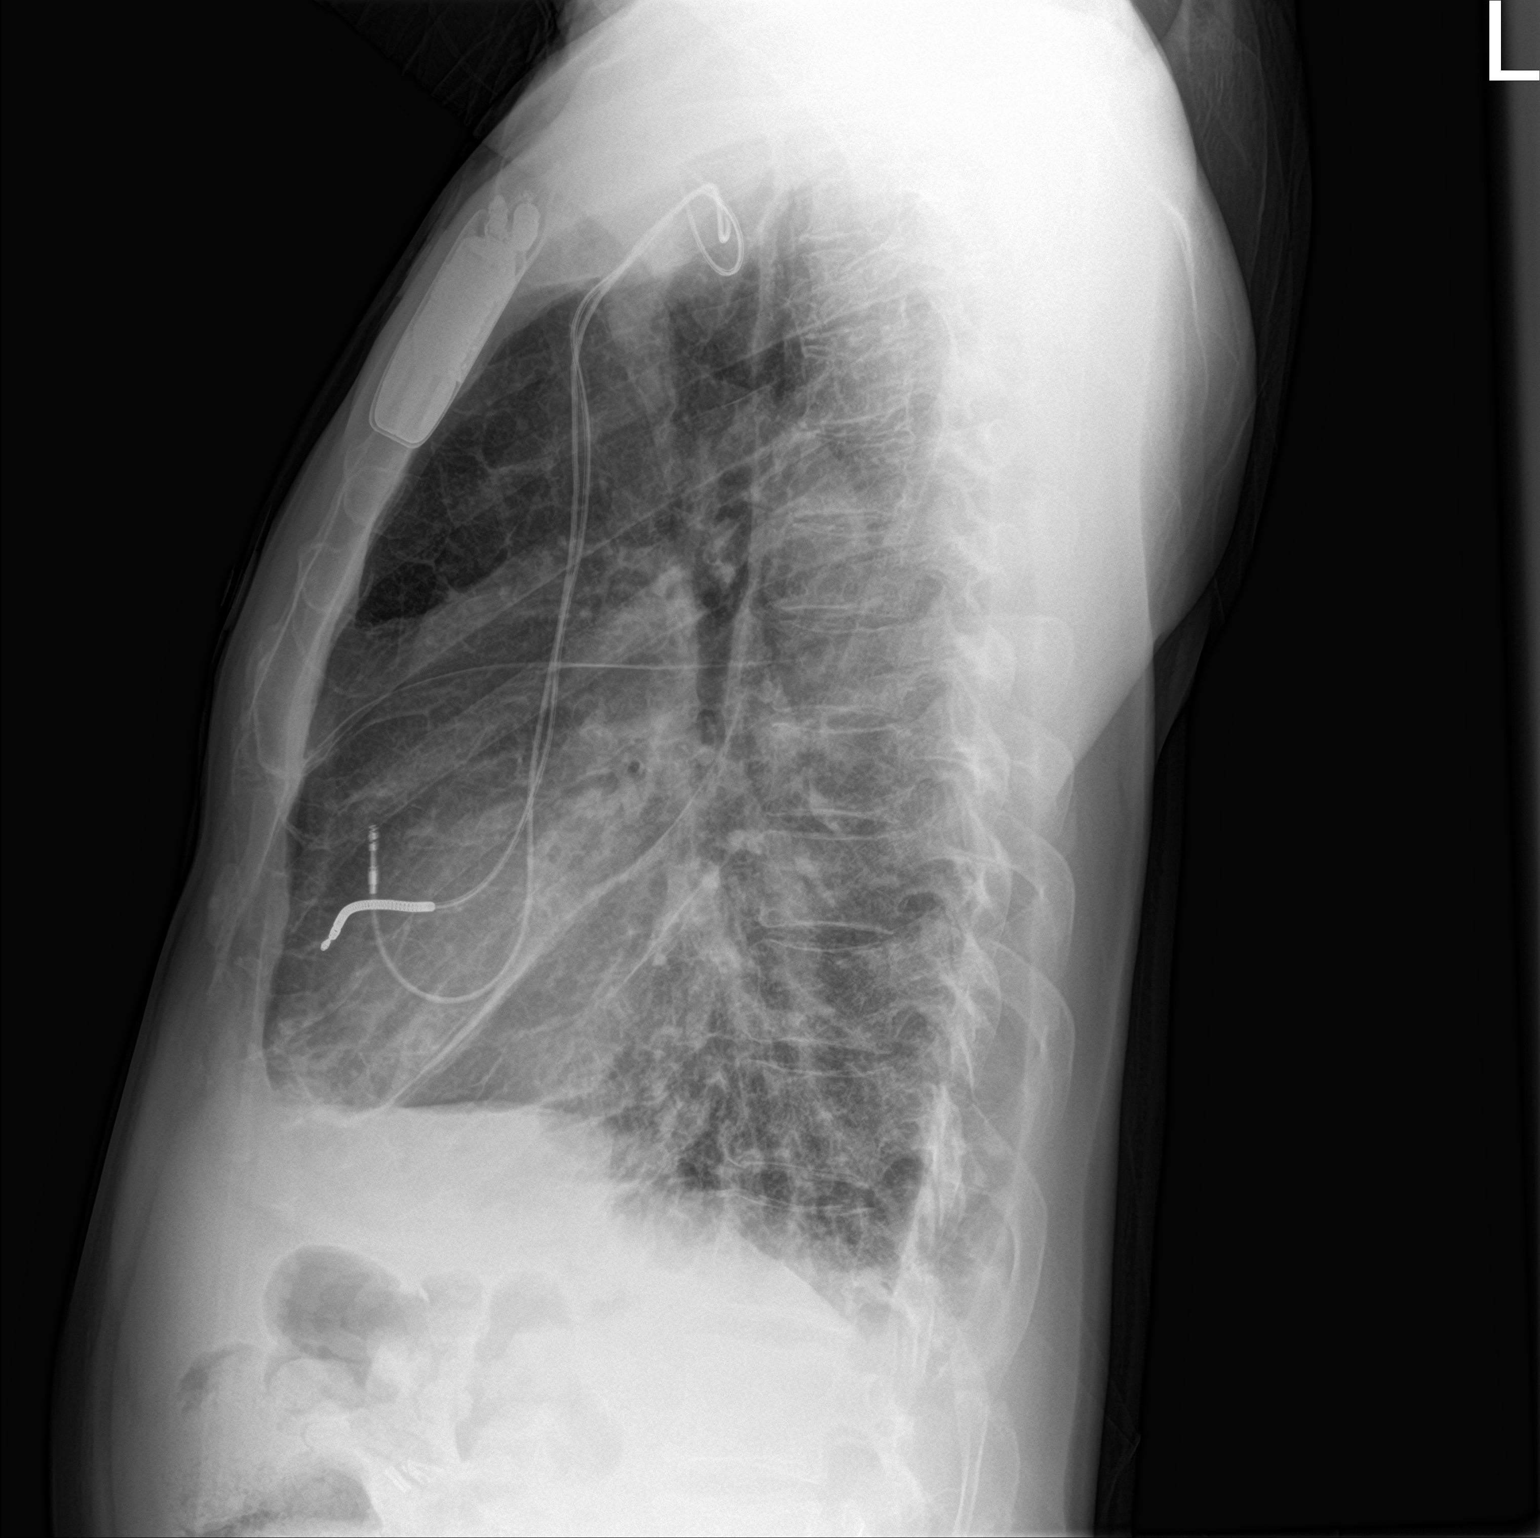

[2 of 2 positions shown; findings below may reference images not displayed]

FINDINGS: The mediastinal contour and cardiac silhouette are stable. Cardiac
pacemaker is unchanged. The lungs are hyperinflated. There is no
focal infiltrate, pulmonary edema, or pleural effusion. Bony
structures stable.
IMPRESSION: 1. No active cardiopulmonary disease.
2. Emphysema.

## 2019-10-07 MED ORDER — SODIUM CHLORIDE 0.9% FLUSH: 3.0000 mL | Freq: Once | INTRAVENOUS | Status: DC

## 2019-10-07 NOTE — ED Triage Notes (Signed)
Shortness of breath started last night, denies any covid exposure.  Hx of "heart problems"

## 2019-10-07 NOTE — ED Notes (Signed)
Pt stated that he feels better and wants to leave. Pt was advised to he should stay. Pt has left. CN made aware.

## 2019-10-16 NOTE — Progress Notes (Signed)
ICD remote 

## 2019-10-26 ENCOUNTER — Other Ambulatory Visit: Payer: Self-pay

## 2019-10-26 ENCOUNTER — Ambulatory Visit (INDEPENDENT_AMBULATORY_CARE_PROVIDER_SITE_OTHER): Payer: Medicare Other | Admitting: *Deleted

## 2019-10-26 DIAGNOSIS — Z7901 Long term (current) use of anticoagulants: Secondary | ICD-10-CM | POA: Diagnosis not present

## 2019-10-26 DIAGNOSIS — I428 Other cardiomyopathies: Secondary | ICD-10-CM | POA: Diagnosis not present

## 2019-10-26 LAB — POCT INR: INR: 2 (ref 2.0–3.0)

## 2019-10-26 NOTE — Patient Instructions (Signed)
Description   Continue taking 1 tablet every day except 1/2 tablet on Mondays, Wednesdays, and Fridays.  Recheck in 6 weeks.  Main # [phone] Coumadin Clinic # [phone].

## 2019-12-19 ENCOUNTER — Telehealth: Payer: Self-pay | Admitting: Internal Medicine

## 2019-12-19 ENCOUNTER — Ambulatory Visit (INDEPENDENT_AMBULATORY_CARE_PROVIDER_SITE_OTHER): Payer: Medicare Other | Admitting: *Deleted

## 2019-12-19 DIAGNOSIS — Z95810 Presence of automatic (implantable) cardiac defibrillator: Secondary | ICD-10-CM

## 2019-12-19 NOTE — Telephone Encounter (Signed)
Patient states he is calling to inquire about whether or not he has to send in transmission for [place] [facility] scheduled for today, 12/19/19. Please advise.

## 2019-12-19 NOTE — Telephone Encounter (Signed)
Spoke with pt, advise dwe have not received transmission.  Assisted pt with sending manual transmission.  Will check for report to come through and if not will call him back.

## 2019-12-20 LAB — CUP PACEART REMOTE DEVICE CHECK
Battery Remaining Longevity: 9 mo
Battery Remaining Percentage: 9 %
Battery Voltage: 2.66 V
Brady Statistic AP VP Percent: 1 %
Brady Statistic AP VS Percent: 1 %
Brady Statistic AS VP Percent: 1 %
Brady Statistic AS VS Percent: 99 %
Brady Statistic RA Percent Paced: 1 %
Brady Statistic RV Percent Paced: 1 %
Date Time Interrogation Session: 20210308215535
HighPow Impedance: 60 Ohm
HighPow Impedance: 60 Ohm
Implantable Lead Implant Date: 20110513
Implantable Lead Implant Date: 20110513
Implantable Lead Location: 753859
Implantable Lead Location: 753860
Implantable Pulse Generator Implant Date: 20110513
Lead Channel Impedance Value: 260 Ohm
Lead Channel Impedance Value: 310 Ohm
Lead Channel Pacing Threshold Amplitude: 0.5 V
Lead Channel Pacing Threshold Amplitude: 0.75 V
Lead Channel Pacing Threshold Pulse Width: 0.5 ms
Lead Channel Pacing Threshold Pulse Width: 0.5 ms
Lead Channel Sensing Intrinsic Amplitude: 12 mV
Lead Channel Sensing Intrinsic Amplitude: 5 mV
Lead Channel Setting Pacing Amplitude: 2 V
Lead Channel Setting Pacing Amplitude: 2.5 V
Lead Channel Setting Pacing Pulse Width: 0.5 ms
Lead Channel Setting Sensing Sensitivity: 0.5 mV
Pulse Gen Serial Number: 608100

## 2019-12-21 NOTE — Telephone Encounter (Signed)
Transmission received 12/19/19.

## 2019-12-23 ENCOUNTER — Observation Stay (HOSPITAL_COMMUNITY): Payer: Medicare Other

## 2019-12-23 ENCOUNTER — Other Ambulatory Visit: Payer: Self-pay

## 2019-12-23 ENCOUNTER — Encounter (HOSPITAL_COMMUNITY): Payer: Self-pay | Admitting: Emergency Medicine

## 2019-12-23 ENCOUNTER — Ambulatory Visit (INDEPENDENT_AMBULATORY_CARE_PROVIDER_SITE_OTHER): Payer: Medicare Other | Admitting: *Deleted

## 2019-12-23 ENCOUNTER — Inpatient Hospital Stay (HOSPITAL_COMMUNITY)
Admission: EM | Admit: 2019-12-23 | Discharge: 2019-12-28 | DRG: 180 | Disposition: A | Discharge status: Home / Self Care | Payer: Medicare Other | Attending: Family Medicine | Admitting: Family Medicine

## 2019-12-23 ENCOUNTER — Emergency Department (HOSPITAL_COMMUNITY): Payer: Medicare Other

## 2019-12-23 DIAGNOSIS — C771 Secondary and unspecified malignant neoplasm of intrathoracic lymph nodes: Secondary | ICD-10-CM | POA: Diagnosis present

## 2019-12-23 DIAGNOSIS — Z7901 Long term (current) use of anticoagulants: Secondary | ICD-10-CM

## 2019-12-23 DIAGNOSIS — C3490 Malignant neoplasm of unspecified part of unspecified bronchus or lung: Secondary | ICD-10-CM | POA: Diagnosis not present

## 2019-12-23 DIAGNOSIS — Z8042 Family history of malignant neoplasm of prostate: Secondary | ICD-10-CM

## 2019-12-23 DIAGNOSIS — F17210 Nicotine dependence, cigarettes, uncomplicated: Secondary | ICD-10-CM | POA: Diagnosis present

## 2019-12-23 DIAGNOSIS — Z95810 Presence of automatic (implantable) cardiac defibrillator: Secondary | ICD-10-CM | POA: Diagnosis present

## 2019-12-23 DIAGNOSIS — R4781 Slurred speech: Secondary | ICD-10-CM | POA: Diagnosis present

## 2019-12-23 DIAGNOSIS — G9389 Other specified disorders of brain: Secondary | ICD-10-CM

## 2019-12-23 DIAGNOSIS — W19XXXA Unspecified fall, initial encounter: Secondary | ICD-10-CM | POA: Diagnosis present

## 2019-12-23 DIAGNOSIS — Z20822 Contact with and (suspected) exposure to covid-19: Secondary | ICD-10-CM | POA: Diagnosis present

## 2019-12-23 DIAGNOSIS — I5022 Chronic systolic (congestive) heart failure: Secondary | ICD-10-CM | POA: Diagnosis present

## 2019-12-23 DIAGNOSIS — I2510 Atherosclerotic heart disease of native coronary artery without angina pectoris: Secondary | ICD-10-CM | POA: Diagnosis present

## 2019-12-23 DIAGNOSIS — Z8673 Personal history of transient ischemic attack (TIA), and cerebral infarction without residual deficits: Secondary | ICD-10-CM

## 2019-12-23 DIAGNOSIS — R918 Other nonspecific abnormal finding of lung field: Secondary | ICD-10-CM | POA: Diagnosis present

## 2019-12-23 DIAGNOSIS — E1122 Type 2 diabetes mellitus with diabetic chronic kidney disease: Secondary | ICD-10-CM | POA: Diagnosis present

## 2019-12-23 DIAGNOSIS — E1165 Type 2 diabetes mellitus with hyperglycemia: Secondary | ICD-10-CM | POA: Diagnosis present

## 2019-12-23 DIAGNOSIS — Z833 Family history of diabetes mellitus: Secondary | ICD-10-CM

## 2019-12-23 DIAGNOSIS — E11319 Type 2 diabetes mellitus with unspecified diabetic retinopathy without macular edema: Secondary | ICD-10-CM

## 2019-12-23 DIAGNOSIS — E785 Hyperlipidemia, unspecified: Secondary | ICD-10-CM | POA: Diagnosis present

## 2019-12-23 DIAGNOSIS — E1139 Type 2 diabetes mellitus with other diabetic ophthalmic complication: Secondary | ICD-10-CM | POA: Diagnosis present

## 2019-12-23 DIAGNOSIS — I428 Other cardiomyopathies: Secondary | ICD-10-CM | POA: Diagnosis not present

## 2019-12-23 DIAGNOSIS — I130 Hypertensive heart and chronic kidney disease with heart failure and stage 1 through stage 4 chronic kidney disease, or unspecified chronic kidney disease: Secondary | ICD-10-CM | POA: Diagnosis present

## 2019-12-23 DIAGNOSIS — N183 Chronic kidney disease, stage 3 unspecified: Secondary | ICD-10-CM | POA: Diagnosis present

## 2019-12-23 DIAGNOSIS — I255 Ischemic cardiomyopathy: Secondary | ICD-10-CM | POA: Diagnosis not present

## 2019-12-23 DIAGNOSIS — R202 Paresthesia of skin: Secondary | ICD-10-CM

## 2019-12-23 DIAGNOSIS — Z794 Long term (current) use of insulin: Secondary | ICD-10-CM

## 2019-12-23 DIAGNOSIS — J439 Emphysema, unspecified: Secondary | ICD-10-CM | POA: Diagnosis present

## 2019-12-23 DIAGNOSIS — I5042 Chronic combined systolic (congestive) and diastolic (congestive) heart failure: Secondary | ICD-10-CM | POA: Diagnosis present

## 2019-12-23 DIAGNOSIS — I6930 Unspecified sequelae of cerebral infarction: Secondary | ICD-10-CM

## 2019-12-23 DIAGNOSIS — E7800 Pure hypercholesterolemia, unspecified: Secondary | ICD-10-CM | POA: Diagnosis present

## 2019-12-23 DIAGNOSIS — E1169 Type 2 diabetes mellitus with other specified complication: Secondary | ICD-10-CM | POA: Diagnosis present

## 2019-12-23 DIAGNOSIS — Z5181 Encounter for therapeutic drug level monitoring: Secondary | ICD-10-CM

## 2019-12-23 DIAGNOSIS — C7931 Secondary malignant neoplasm of brain: Secondary | ICD-10-CM | POA: Diagnosis present

## 2019-12-23 DIAGNOSIS — N179 Acute kidney failure, unspecified: Secondary | ICD-10-CM | POA: Diagnosis present

## 2019-12-23 DIAGNOSIS — R590 Localized enlarged lymph nodes: Secondary | ICD-10-CM

## 2019-12-23 DIAGNOSIS — I10 Essential (primary) hypertension: Secondary | ICD-10-CM

## 2019-12-23 DIAGNOSIS — Z9049 Acquired absence of other specified parts of digestive tract: Secondary | ICD-10-CM

## 2019-12-23 DIAGNOSIS — Z8249 Family history of ischemic heart disease and other diseases of the circulatory system: Secondary | ICD-10-CM

## 2019-12-23 DIAGNOSIS — G8194 Hemiplegia, unspecified affecting left nondominant side: Secondary | ICD-10-CM

## 2019-12-23 DIAGNOSIS — G936 Cerebral edema: Secondary | ICD-10-CM | POA: Diagnosis present

## 2019-12-23 HISTORY — DX: Cerebral infarction due to embolism of right middle cerebral artery: I63.411

## 2019-12-23 HISTORY — DX: Cerebral infarction, unspecified: I63.9

## 2019-12-23 HISTORY — DX: Abnormal findings on diagnostic imaging of heart and coronary circulation: R93.1

## 2019-12-23 HISTORY — DX: Other specified symptoms and signs involving the circulatory and respiratory systems: R09.89

## 2019-12-23 HISTORY — DX: Presence of other vascular implants and grafts: Z95.828

## 2019-12-23 LAB — DIFFERENTIAL
Abs Immature Granulocytes: 0.04 10*3/uL (ref 0.00–0.07)
Basophils Absolute: 0.1 10*3/uL (ref 0.0–0.1)
Basophils Relative: 1 %
Eosinophils Absolute: 0.3 10*3/uL (ref 0.0–0.5)
Eosinophils Relative: 3 %
Immature Granulocytes: 0 %
Lymphocytes Relative: 29 %
Lymphs Abs: 2.9 10*3/uL (ref 0.7–4.0)
Monocytes Absolute: 0.9 10*3/uL (ref 0.1–1.0)
Monocytes Relative: 9 %
Neutro Abs: 5.8 10*3/uL (ref 1.7–7.7)
Neutrophils Relative %: 58 %

## 2019-12-23 LAB — I-STAT CHEM 8, ED
BUN: 27 mg/dL — ABNORMAL HIGH (ref 6–20)
Calcium, Ion: 1.11 mmol/L — ABNORMAL LOW (ref 1.15–1.40)
Chloride: 108 mmol/L (ref 98–111)
Creatinine, Ser: 1.7 mg/dL — ABNORMAL HIGH (ref 0.61–1.24)
Glucose, Bld: 132 mg/dL — ABNORMAL HIGH (ref 70–99)
HCT: 42 % (ref 39.0–52.0)
Hemoglobin: 14.3 g/dL (ref 13.0–17.0)
Potassium: 4.6 mmol/L (ref 3.5–5.1)
Sodium: 139 mmol/L (ref 135–145)
TCO2: 27 mmol/L (ref 22–32)

## 2019-12-23 LAB — GLUCOSE, CAPILLARY
Glucose-Capillary: 280 mg/dL — ABNORMAL HIGH (ref 70–99)
Glucose-Capillary: 200 mg/dL — ABNORMAL HIGH (ref 70–99)
Glucose-Capillary: 228 mg/dL — ABNORMAL HIGH (ref 70–99)

## 2019-12-23 LAB — CBC
HCT: 42.8 % (ref 39.0–52.0)
Hemoglobin: 13.1 g/dL (ref 13.0–17.0)
MCH: 29.8 pg (ref 26.0–34.0)
MCHC: 30.6 g/dL (ref 30.0–36.0)
MCV: 97.5 fL (ref 80.0–100.0)
Platelets: 280 10*3/uL (ref 150–400)
RBC: 4.39 MIL/uL (ref 4.22–5.81)
RDW: 13.5 % (ref 11.5–15.5)
WBC: 9.9 10*3/uL (ref 4.0–10.5)
nRBC: 0 % (ref 0.0–0.2)

## 2019-12-23 LAB — APTT: aPTT: 45 seconds — ABNORMAL HIGH (ref 24–36)

## 2019-12-23 LAB — CBG MONITORING, ED: Glucose-Capillary: 118 mg/dL — ABNORMAL HIGH (ref 70–99)

## 2019-12-23 LAB — COMPREHENSIVE METABOLIC PANEL
ALT: 13 U/L (ref 0–44)
AST: 18 U/L (ref 15–41)
Albumin: 3.3 g/dL — ABNORMAL LOW (ref 3.5–5.0)
Alkaline Phosphatase: 96 U/L (ref 38–126)
Anion gap: 11 (ref 5–15)
BUN: 25 mg/dL — ABNORMAL HIGH (ref 6–20)
CO2: 21 mmol/L — ABNORMAL LOW (ref 22–32)
Calcium: 9.1 mg/dL (ref 8.9–10.3)
Chloride: 107 mmol/L (ref 98–111)
Creatinine, Ser: 1.64 mg/dL — ABNORMAL HIGH (ref 0.61–1.24)
GFR calc Af Amer: 53 mL/min — ABNORMAL LOW (ref >60)
GFR calc non Af Amer: 46 mL/min — ABNORMAL LOW (ref >60)
Glucose, Bld: 135 mg/dL — ABNORMAL HIGH (ref 70–99)
Potassium: 4.6 mmol/L (ref 3.5–5.1)
Sodium: 139 mmol/L (ref 135–145)
Total Bilirubin: 1 mg/dL (ref 0.3–1.2)
Total Protein: 6.7 g/dL (ref 6.5–8.1)

## 2019-12-23 LAB — SARS CORONAVIRUS 2 (TAT 6-24 HRS): SARS Coronavirus 2: NEGATIVE

## 2019-12-23 LAB — PROTIME-INR
INR: 2.4 — ABNORMAL HIGH (ref 0.8–1.2)
Prothrombin Time: 25.9 seconds — ABNORMAL HIGH (ref 11.4–15.2)

## 2019-12-23 LAB — POCT INR: INR: 3.1 — AB (ref 2.0–3.0)

## 2019-12-23 IMAGING — CT CT ABD-PELV W/ CM
2 of 5 series · 13 of 46 positions shown, 15 images · IV contrast (omnipaque)
Comparison: [DATE] chest CT angiogram and [DATE] CT
abdomen.

CLINICAL DATA: Inpatient. Right frontal lobe cerebral mass with
vasogenic edema on head CT. Assess for primary neoplasm.

EXAM:
CT CHEST, ABDOMEN, AND PELVIS WITH CONTRAST
TECHNIQUE: Multidetector CT imaging of the chest, abdomen and pelvis was
performed following the standard protocol during bolus
administration of intravenous contrast.
CONTRAST:  100mL OMNIPAQUE IOHEXOL 300 MG/ML  SOLN

[Series 3: cap with · axial · 0.75mm/px · z∈[+594,+1184]mm · 10 of 142 slices shown, 12 images]
[im 12/142  soft-tissue]
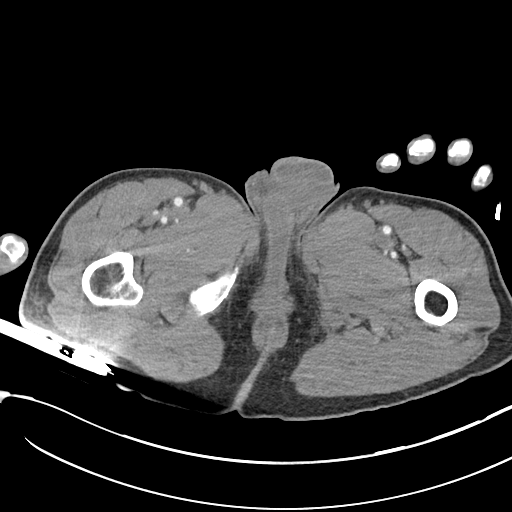
[im 12/142  bone]
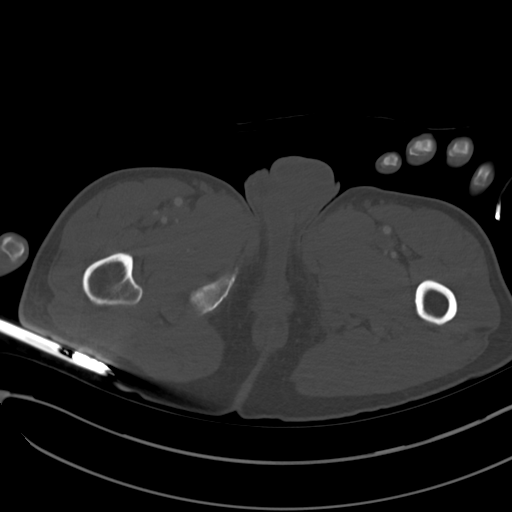
[im 24/142  soft-tissue]
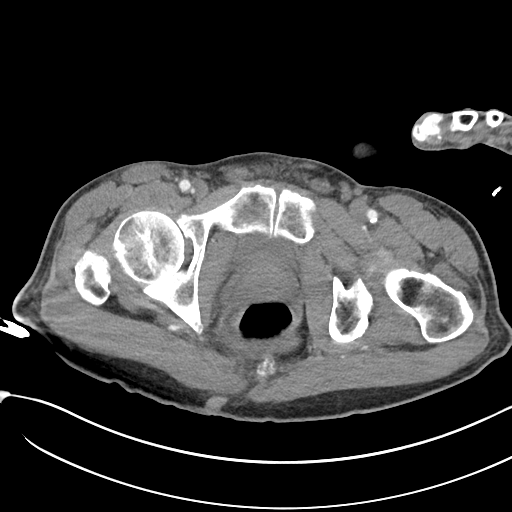
[im 36/142  soft-tissue]
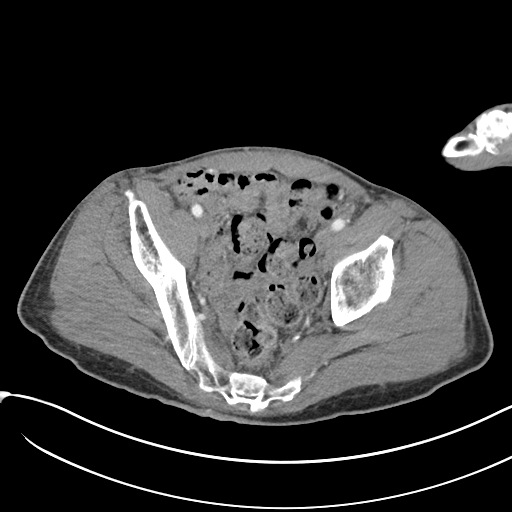
[im 48/142  soft-tissue]
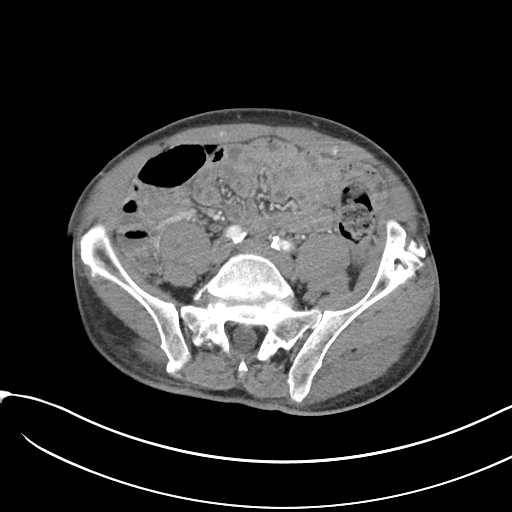
[im 59/142  soft-tissue]
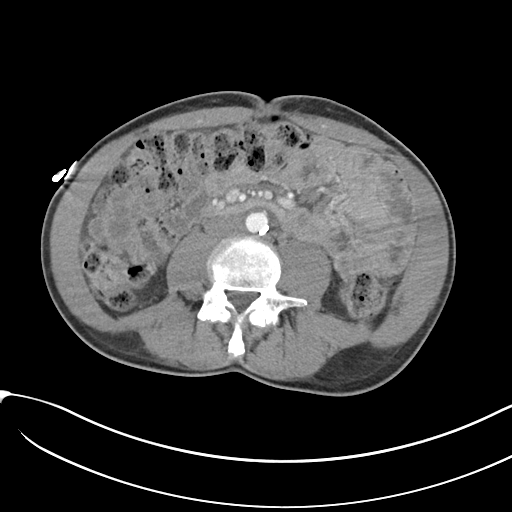
[im 83/142  soft-tissue]
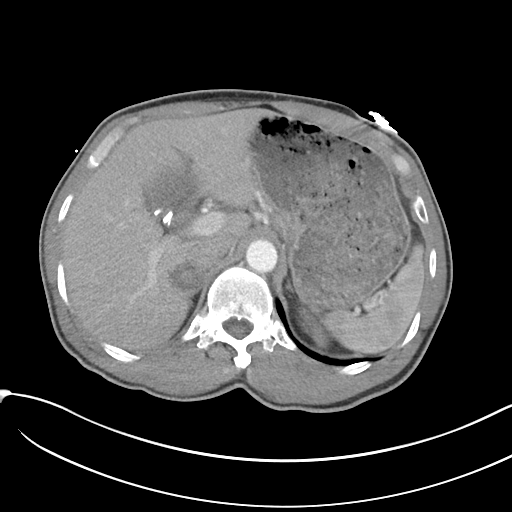
[im 95/142  soft-tissue]
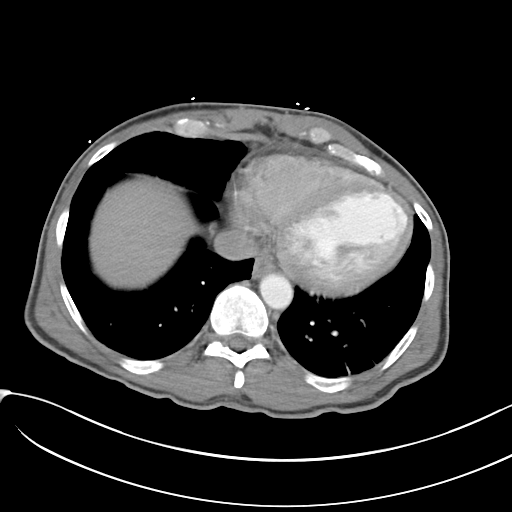
[im 106/142  soft-tissue]
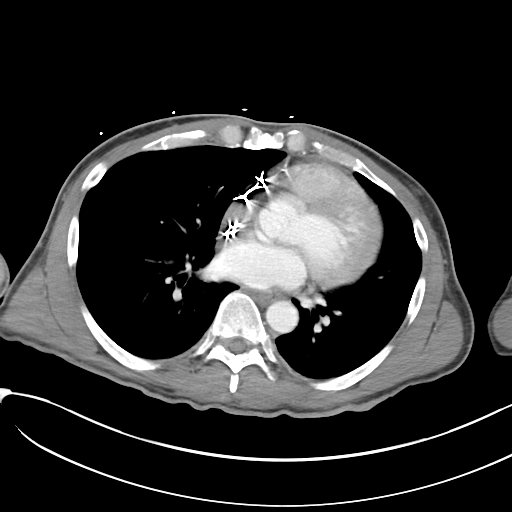
[im 118/142  soft-tissue]
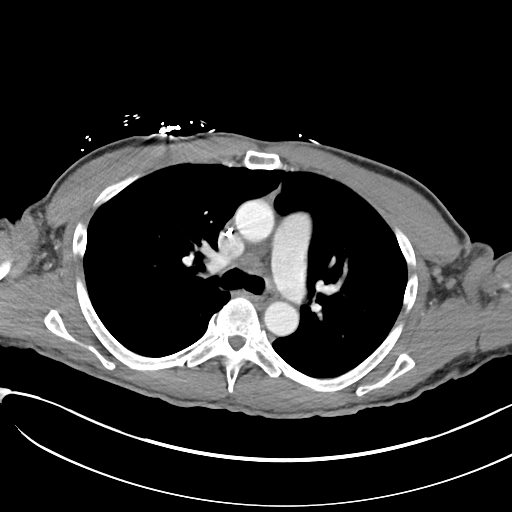
[im 118/142  bone]
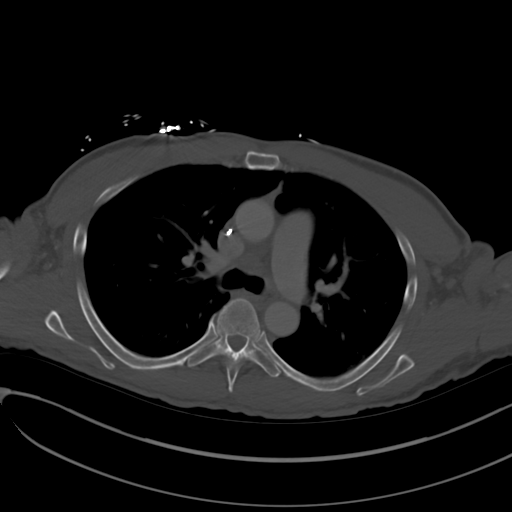
[im 130/142  soft-tissue]
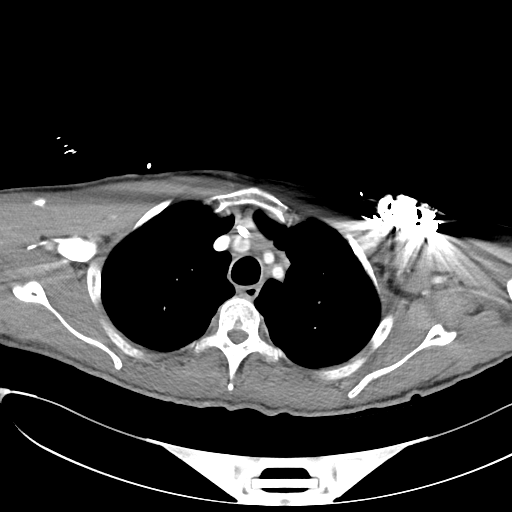

[Series 6: cor · coronal · 0.86mm/px · 3 of 98 slices shown]
[im 33/98  soft-tissue]
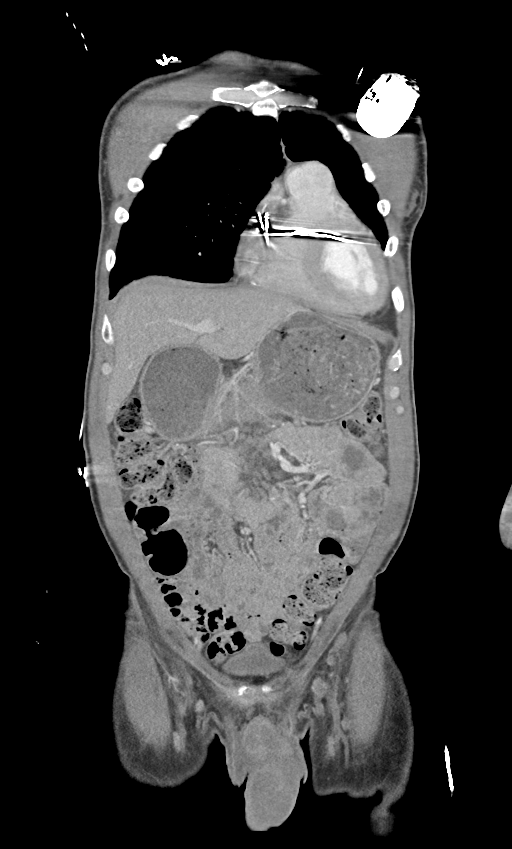
[im 44/98  soft-tissue]
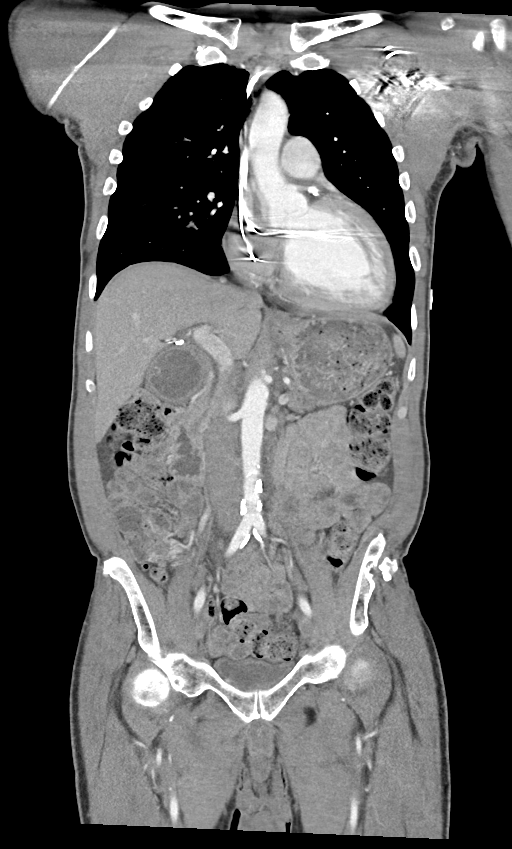
[im 54/98  soft-tissue]
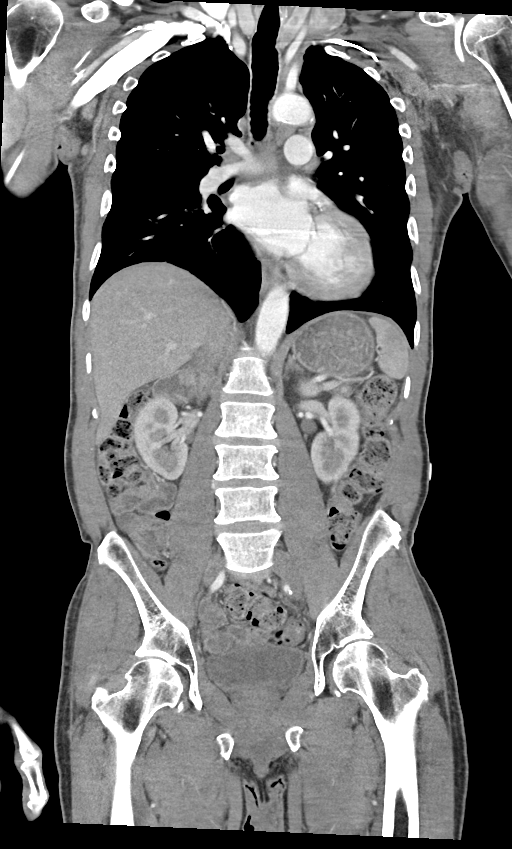

[13 of 46 positions shown; findings below may reference images not displayed]

FINDINGS: CT CHEST FINDINGS

Cardiovascular: Normal heart size. No significant pericardial
effusion/thickening. Two lead left subclavian ICD is noted with lead
tips in the right atrium and right ventricular apex. Left anterior
descending coronary atherosclerosis. Atherosclerotic nonaneurysmal
thoracic aorta. Normal caliber pulmonary arteries. No central
pulmonary emboli.

Mediastinum/Nodes: No discrete thyroid nodules. Unremarkable
esophagus. No axillary adenopathy. Mildly enlarged 1.1 cm
aortopulmonary node (series 3/image 23), new since [W7]. No
additional pathologically enlarged mediastinal nodes. No
pathologically enlarged hilar nodes.

Lungs/Pleura: No pneumothorax. No pleural effusion. Moderate
centrilobular and paraseptal emphysema. No acute consolidative
airspace disease or lung masses. Irregular spiculated posterior
right middle lobe 1.5 x 0.7 cm pulmonary nodule abutting and
distorting the right major fissure (series 5/image 97), which may
represent growth of a 0.5 cm nodule seen in this location on
[DATE] chest CT. No additional significant pulmonary nodules.
Parenchymal banding at the dependent left lung base compatible with
nonspecific postinfectious/postinflammatory scarring.

Musculoskeletal: No aggressive appearing focal osseous lesions.
Moderate symmetric bilateral gynecomastia is unchanged.

CT ABDOMEN PELVIS FINDINGS

Hepatobiliary: Normal liver with no liver mass. Cholecystectomy. No
biliary ductal dilatation.

Pancreas: Normal, with no mass or duct dilation.

Spleen: Normal size. No mass.

Adrenals/Urinary Tract: Right adrenal 3.2 cm nodule with density 45
HU minimally increased from 2.8 cm on [DATE] CT. No left adrenal
nodules. No hydronephrosis. Subcentimeter hypodense renal cortical
lesion in the upper right kidney is too small to characterize and
requires no follow-up. Scattered mild renal cortical scarring in the
kidneys bilaterally. Normal bladder.

Stomach/Bowel: Normal non-distended stomach. Normal caliber small
bowel with no small bowel wall thickening. Appendix not discretely
visualized. Normal large bowel with no diverticulosis, large bowel
wall thickening or pericolonic fat stranding.

Vascular/Lymphatic: Atherosclerotic nonaneurysmal abdominal aorta.
Patent portal, splenic, hepatic and renal veins. No pathologically
enlarged lymph nodes in the abdomen or pelvis.

Reproductive: Top-normal size prostate.

Other: No pneumoperitoneum, ascites or focal fluid collection.

Musculoskeletal: No aggressive appearing focal osseous lesions.
Healed deformity in the lateral left iliac wing. Moderate
degenerative disc disease at L5-S1 with unilateral right L5 pars
defect.
IMPRESSION: 1. Spiculated 1.5 x 0.7 cm posterior right middle lobe pulmonary
nodule abutting and distorting the right major fissure, suspicious
for primary bronchogenic carcinoma.
2. Solitary mildly enlarged aortopulmonary mediastinal lymph node.
Metastatic disease not excluded.
3. Indeterminate right adrenal 3.2 cm nodule, mildly increased since
[W7] CT, more likely a slow growing adenoma.
4. Suggest short-term outpatient PET-CT for further staging
evaluation.
5. Aortic Atherosclerosis ([W7]-[W7]) and Emphysema ([W7]-[W7]).

## 2019-12-23 IMAGING — CT CT HEAD W/O CM
4 series · 17 of 47 positions shown, 19 images · non-contrast
Comparison: [DATE]

CLINICAL DATA: Left arm weakness since [REDACTED] or [REDACTED]. Fall on
[REDACTED] night.

EXAM:
CT HEAD WITHOUT CONTRAST
TECHNIQUE: Contiguous axial images were obtained from the base of the skull
through the vertex without intravenous contrast.

[Series 3: head wo · axial · 0.44mm/px · z∈[+1372,+1498]mm · 7 of 35 slices shown, 9 images]
[im 5/35  brain]
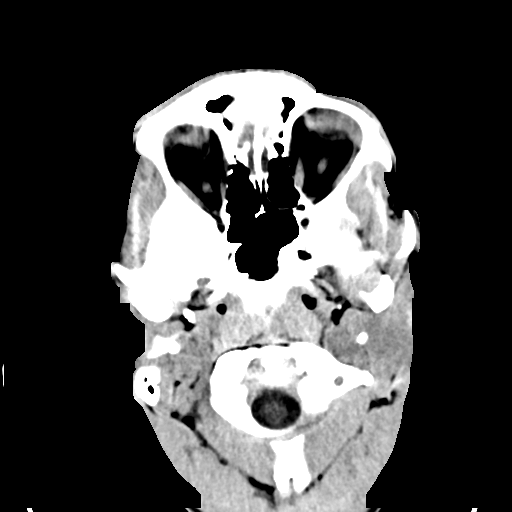
[im 5/35  bone]
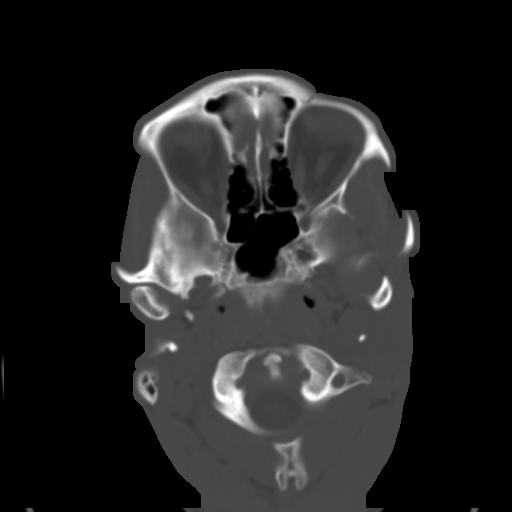
[im 9/35  brain]
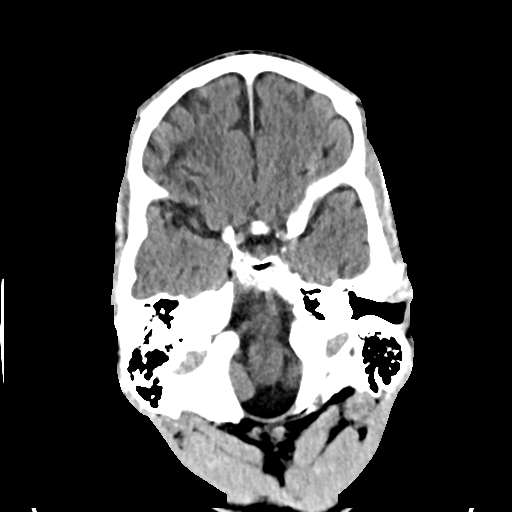
[im 13/35  brain]
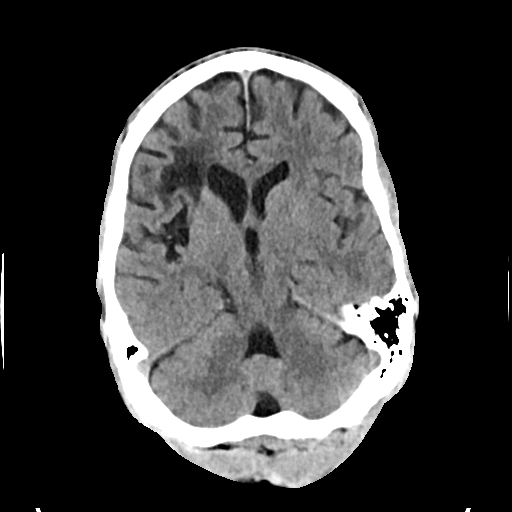
[im 18/35  brain]
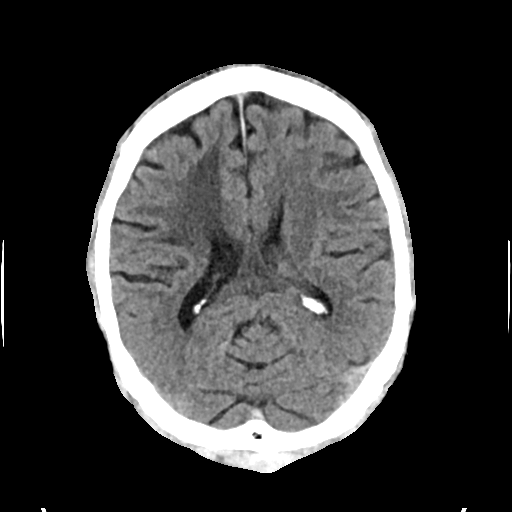
[im 22/35  brain]
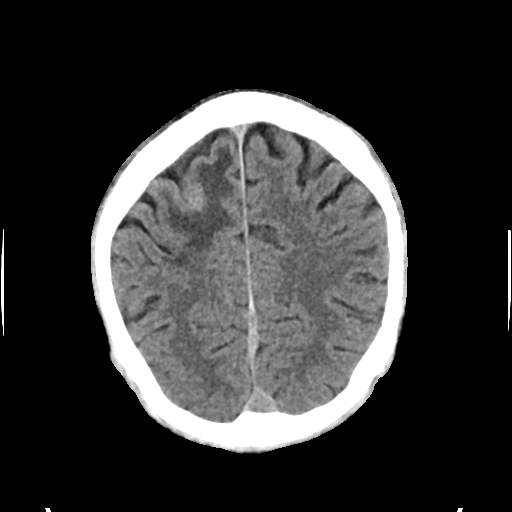
[im 22/35  bone]
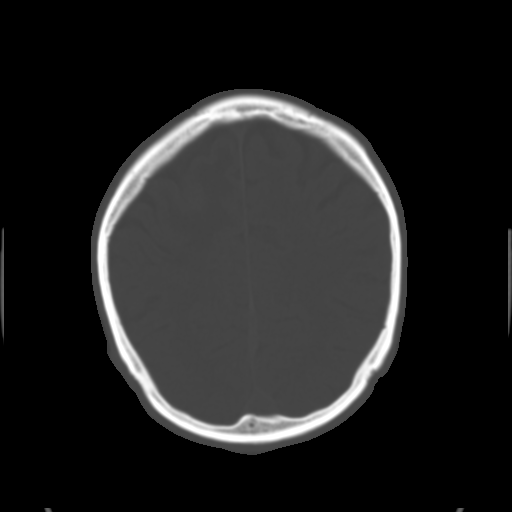
[im 26/35  brain]
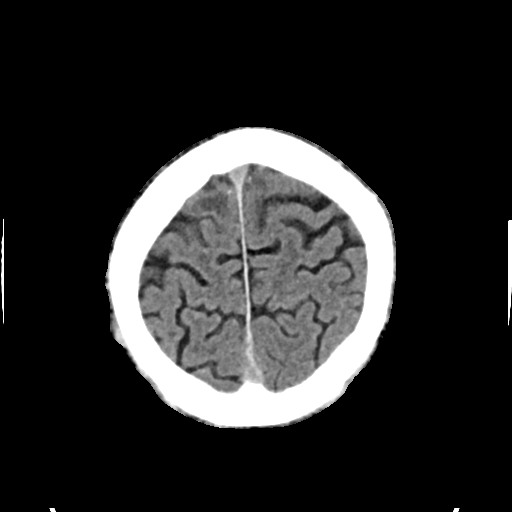
[im 30/35  brain]
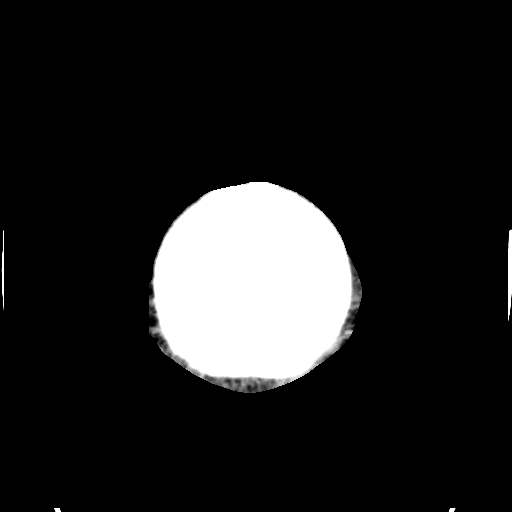

[Series 4: head bone · axial · 0.44mm/px · z∈[+1368,+1428]mm · 4 of 86 slices shown]
[im 9/86  bone]
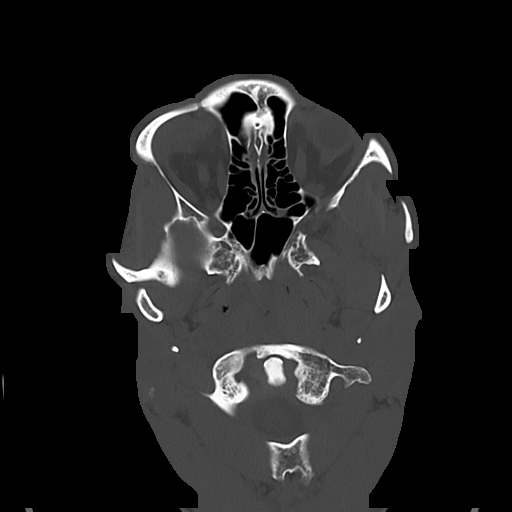
[im 18/86  bone]
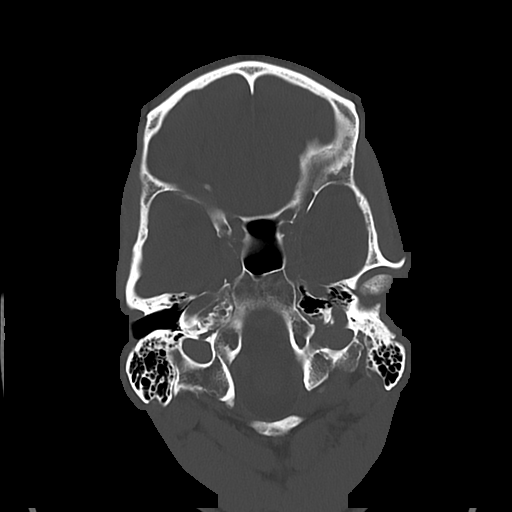
[im 26/86  bone]
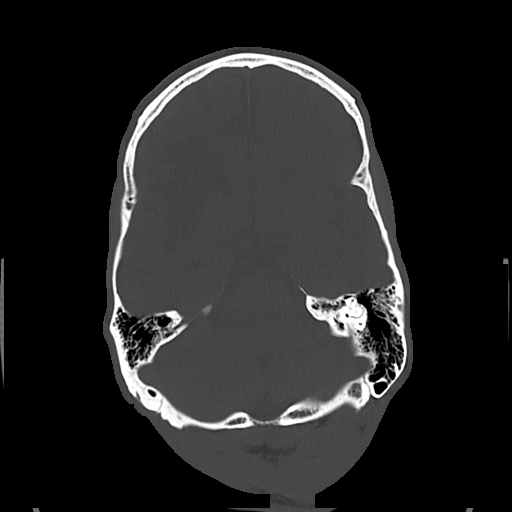
[im 39/86  bone]
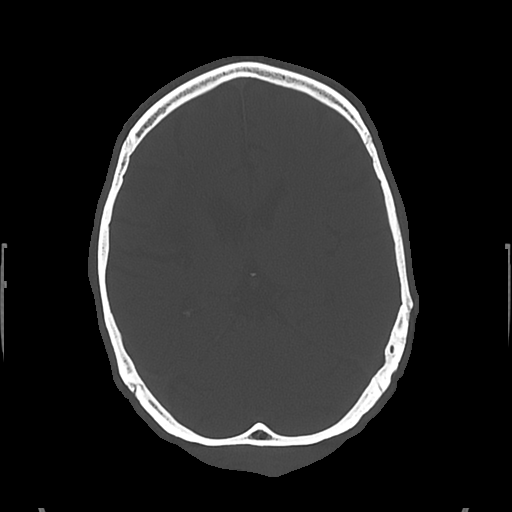

[Series 5: cor soft · coronal · 0.32mm/px · 3 of 72 slices shown]
[im 26/72  brain]
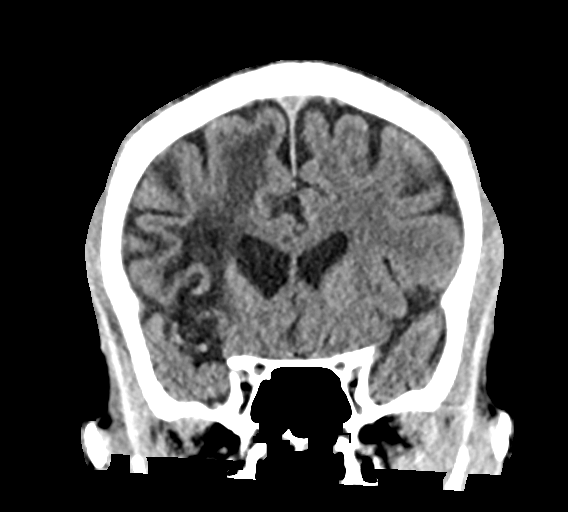
[im 33/72  brain]
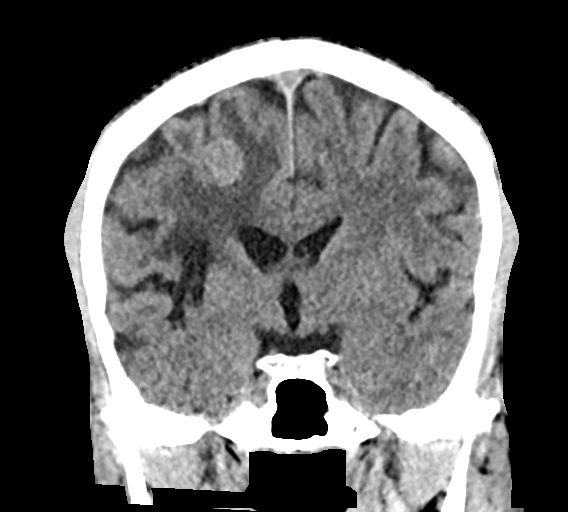
[im 39/72  brain]
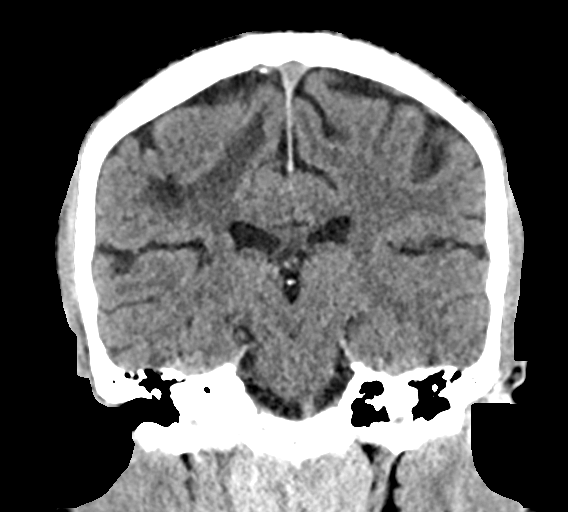

[Series 6: sag soft · sagittal · 0.35mm/px · 3 of 61 slices shown]
[im 21/61  brain]
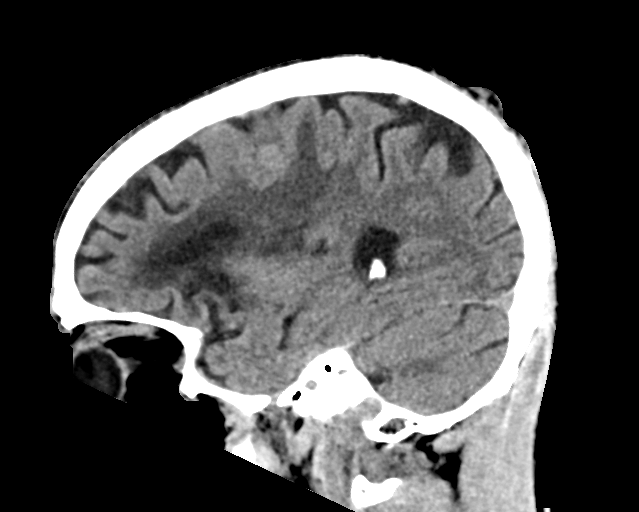
[im 31/61  brain]
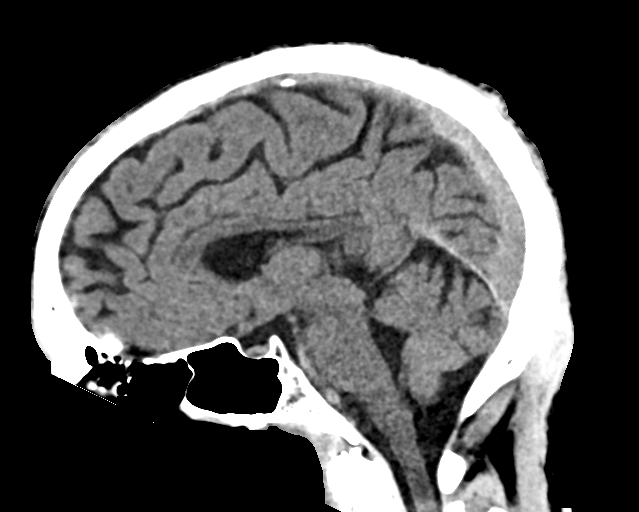
[im 41/61  brain]
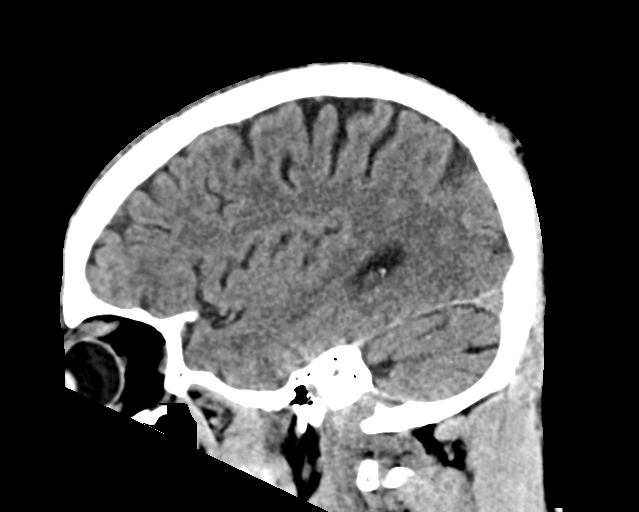

[17 of 47 positions shown; findings below may reference images not displayed]

FINDINGS: Brain: 14 mm dense intra-axial mass with moderate vasogenic edema in
the high right frontal lobe.

Remote infarct in the right frontal operculum and insula. No acute
hemorrhage or hydrocephalus. No acute infarct.

Vascular: No hyperdense vessel.

Skull: No evidence of fracture or bone lesion

Sinuses/Orbits: Negative
IMPRESSION: 1. 14 mm right frontal mass with vasogenic edema. Recommend brain
MRI with contrast.
2. Chronic right insular and opercular infarct.

## 2019-12-23 IMAGING — CT CT CHEST W/ CM
2 of 6 series · 13 of 46 positions shown, 15 images · IV contrast (omnipaque)
Comparison: [DATE] chest CT angiogram and [DATE] CT
abdomen.

CLINICAL DATA: Inpatient. Right frontal lobe cerebral mass with
vasogenic edema on head CT. Assess for primary neoplasm.

EXAM:
CT CHEST, ABDOMEN, AND PELVIS WITH CONTRAST
TECHNIQUE: Multidetector CT imaging of the chest, abdomen and pelvis was
performed following the standard protocol during bolus
administration of intravenous contrast.
CONTRAST:  100mL OMNIPAQUE IOHEXOL 300 MG/ML  SOLN

[Series 4: thins · axial · 0.75mm/px · z∈[+598,+1183]mm · 10 of 1011 slices shown, 12 images]
[im 88/1011  soft-tissue]
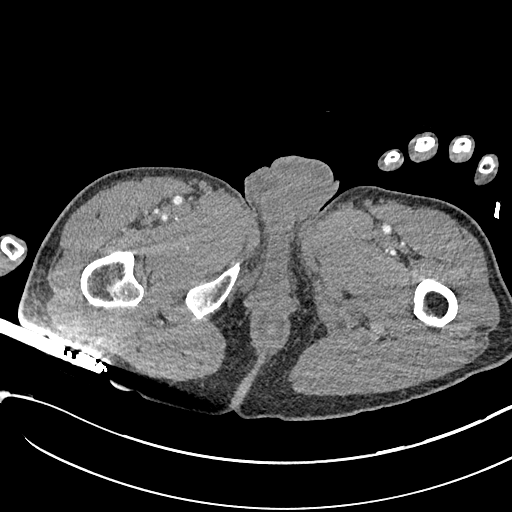
[im 88/1011  bone]
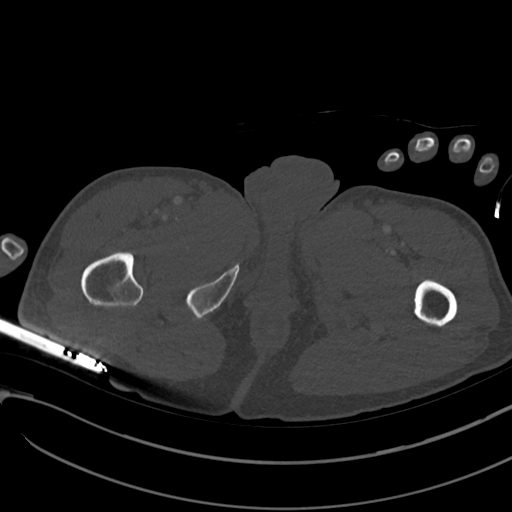
[im 176/1011  soft-tissue]
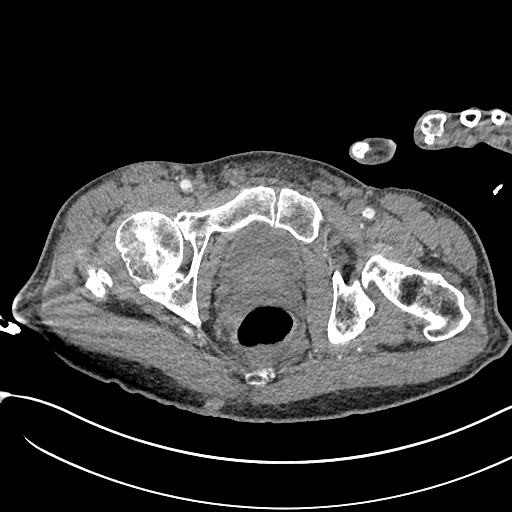
[im 264/1011  soft-tissue]
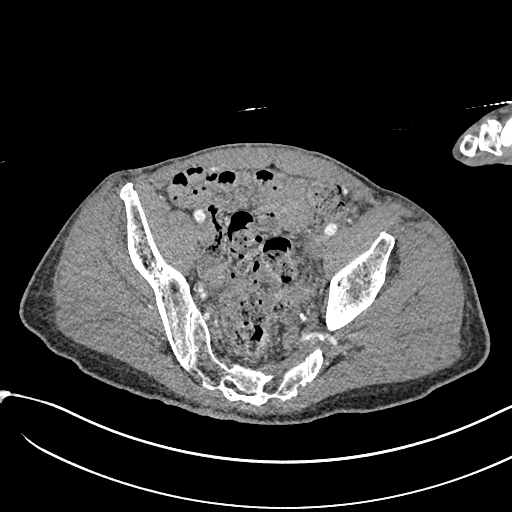
[im 352/1011  soft-tissue]
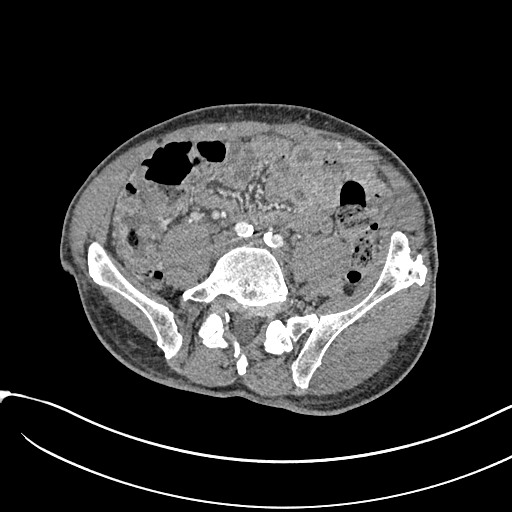
[im 440/1011  soft-tissue]
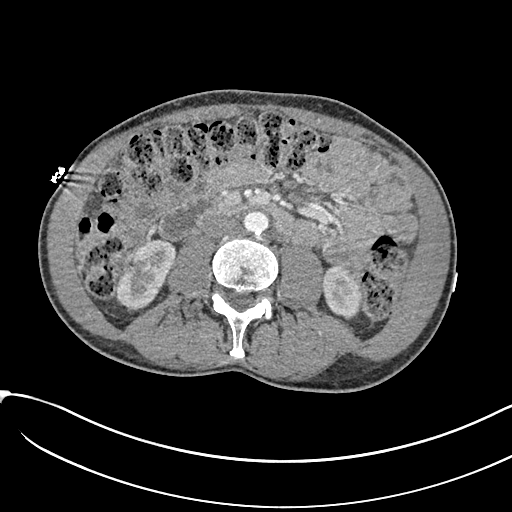
[im 571/1011  soft-tissue]
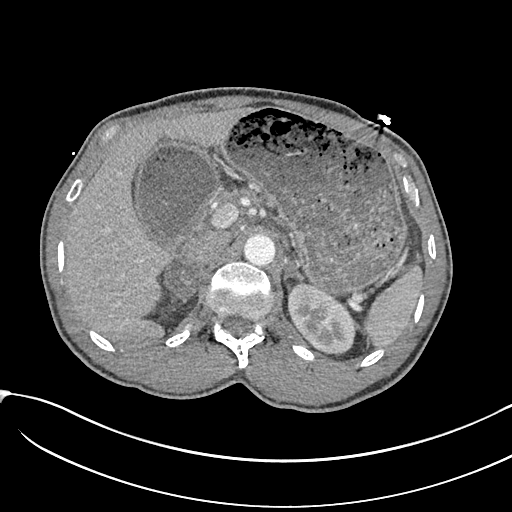
[im 659/1011  soft-tissue]
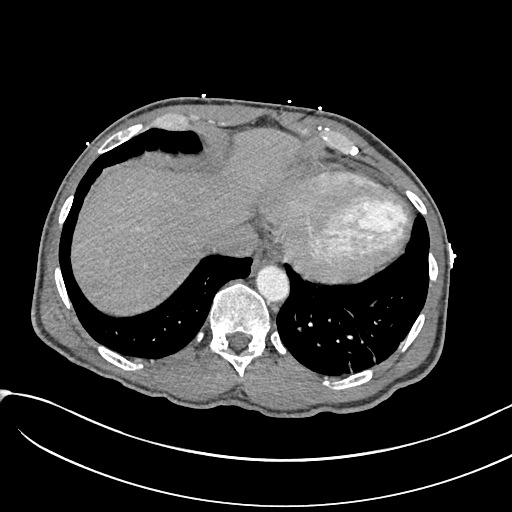
[im 747/1011  soft-tissue]
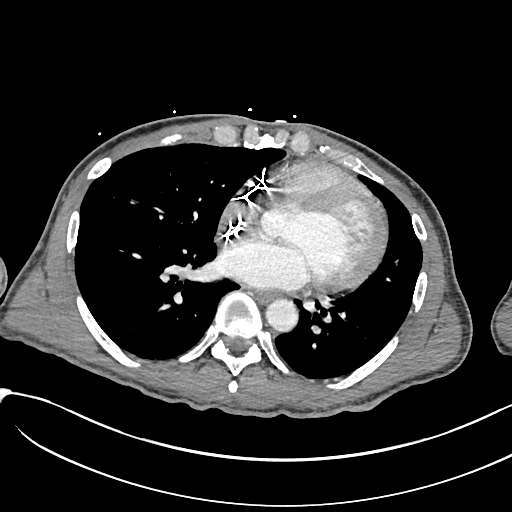
[im 835/1011  soft-tissue]
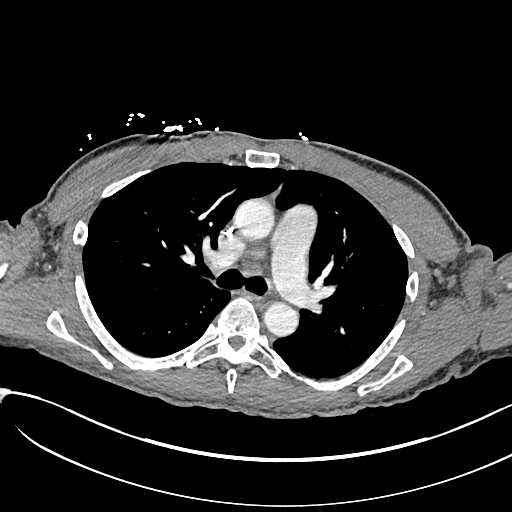
[im 835/1011  bone]
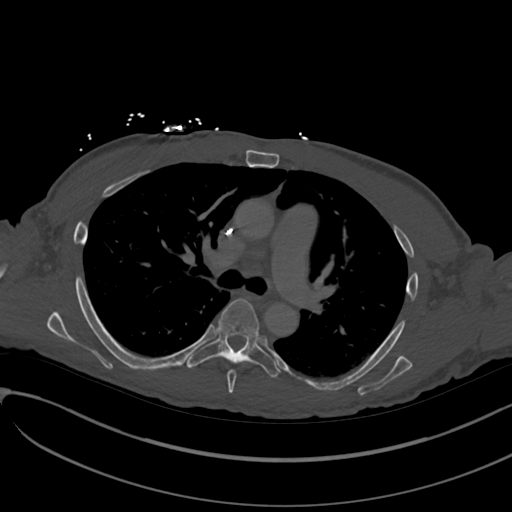
[im 923/1011  soft-tissue]
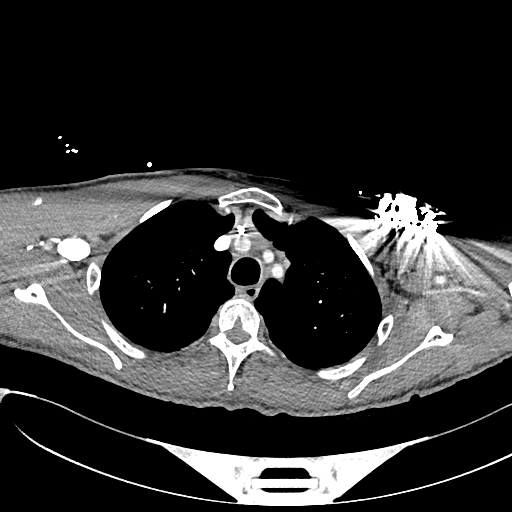

[Series 6: cor · coronal · 0.86mm/px · 3 of 98 slices shown]
[im 33/98  soft-tissue]
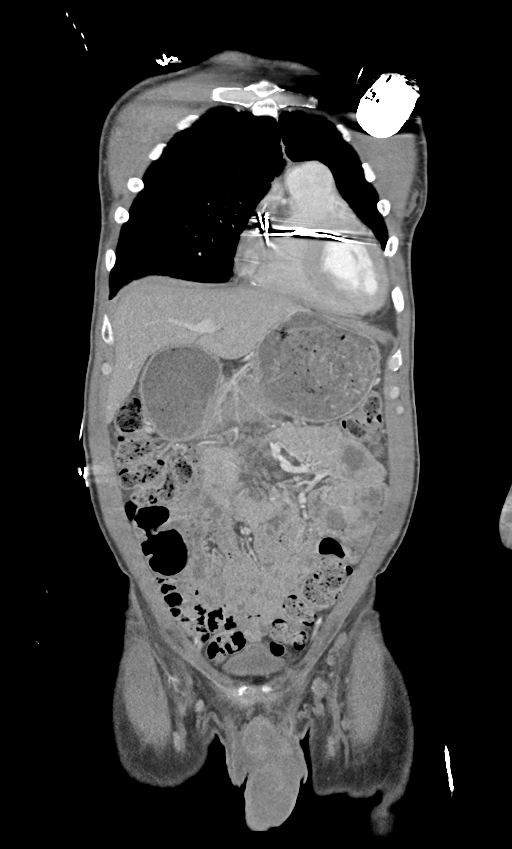
[im 44/98  soft-tissue]
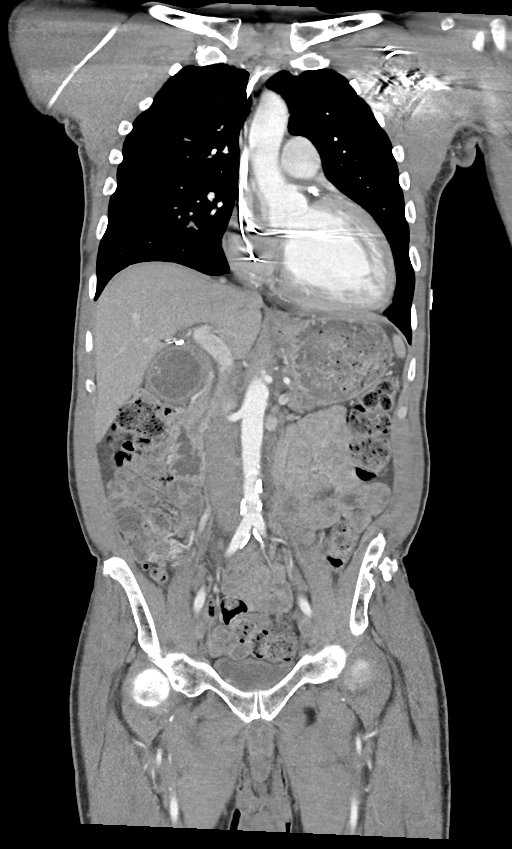
[im 54/98  soft-tissue]
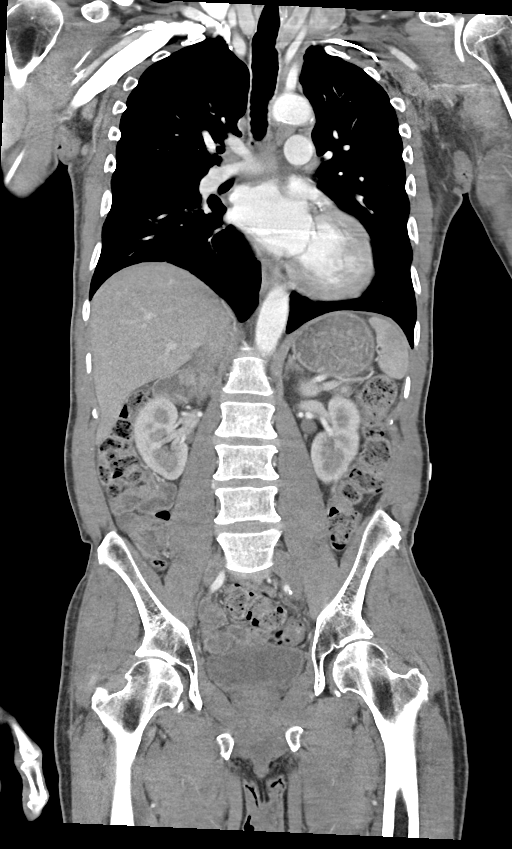

[13 of 46 positions shown; findings below may reference images not displayed]

FINDINGS: CT CHEST FINDINGS

Cardiovascular: Normal heart size. No significant pericardial
effusion/thickening. Two lead left subclavian ICD is noted with lead
tips in the right atrium and right ventricular apex. Left anterior
descending coronary atherosclerosis. Atherosclerotic nonaneurysmal
thoracic aorta. Normal caliber pulmonary arteries. No central
pulmonary emboli.

Mediastinum/Nodes: No discrete thyroid nodules. Unremarkable
esophagus. No axillary adenopathy. Mildly enlarged 1.1 cm
aortopulmonary node (series 3/image 23), new since [W7]. No
additional pathologically enlarged mediastinal nodes. No
pathologically enlarged hilar nodes.

Lungs/Pleura: No pneumothorax. No pleural effusion. Moderate
centrilobular and paraseptal emphysema. No acute consolidative
airspace disease or lung masses. Irregular spiculated posterior
right middle lobe 1.5 x 0.7 cm pulmonary nodule abutting and
distorting the right major fissure (series 5/image 97), which may
represent growth of a 0.5 cm nodule seen in this location on
[DATE] chest CT. No additional significant pulmonary nodules.
Parenchymal banding at the dependent left lung base compatible with
nonspecific postinfectious/postinflammatory scarring.

Musculoskeletal: No aggressive appearing focal osseous lesions.
Moderate symmetric bilateral gynecomastia is unchanged.

CT ABDOMEN PELVIS FINDINGS

Hepatobiliary: Normal liver with no liver mass. Cholecystectomy. No
biliary ductal dilatation.

Pancreas: Normal, with no mass or duct dilation.

Spleen: Normal size. No mass.

Adrenals/Urinary Tract: Right adrenal 3.2 cm nodule with density 45
HU minimally increased from 2.8 cm on [DATE] CT. No left adrenal
nodules. No hydronephrosis. Subcentimeter hypodense renal cortical
lesion in the upper right kidney is too small to characterize and
requires no follow-up. Scattered mild renal cortical scarring in the
kidneys bilaterally. Normal bladder.

Stomach/Bowel: Normal non-distended stomach. Normal caliber small
bowel with no small bowel wall thickening. Appendix not discretely
visualized. Normal large bowel with no diverticulosis, large bowel
wall thickening or pericolonic fat stranding.

Vascular/Lymphatic: Atherosclerotic nonaneurysmal abdominal aorta.
Patent portal, splenic, hepatic and renal veins. No pathologically
enlarged lymph nodes in the abdomen or pelvis.

Reproductive: Top-normal size prostate.

Other: No pneumoperitoneum, ascites or focal fluid collection.

Musculoskeletal: No aggressive appearing focal osseous lesions.
Healed deformity in the lateral left iliac wing. Moderate
degenerative disc disease at L5-S1 with unilateral right L5 pars
defect.
IMPRESSION: 1. Spiculated 1.5 x 0.7 cm posterior right middle lobe pulmonary
nodule abutting and distorting the right major fissure, suspicious
for primary bronchogenic carcinoma.
2. Solitary mildly enlarged aortopulmonary mediastinal lymph node.
Metastatic disease not excluded.
3. Indeterminate right adrenal 3.2 cm nodule, mildly increased since
[W7] CT, more likely a slow growing adenoma.
4. Suggest short-term outpatient PET-CT for further staging
evaluation.
5. Aortic Atherosclerosis ([W7]-[W7]) and Emphysema ([W7]-[W7]).

## 2019-12-23 IMAGING — CT CT HEAD W/ CM
4 series · 16 of 47 positions shown, 18 images · IV contrast (omnipaque)
Comparison: CT head without contrast [DATE]

CLINICAL DATA: Cerebral mass.

EXAM:
CT HEAD WITH CONTRAST
TECHNIQUE: Contiguous axial images were obtained from the base of the skull
through the vertex with intravenous contrast.
CONTRAST:  100mL OMNIPAQUE IOHEXOL 300 MG/ML  SOLN

[Series 3: head wo · axial · 0.48mm/px · z∈[+1358,+1494]mm · 7 of 37 slices shown, 9 images]
[im 5/37  brain]
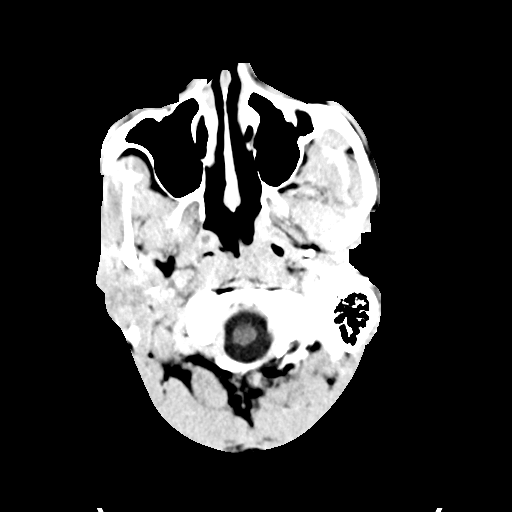
[im 5/37  bone]
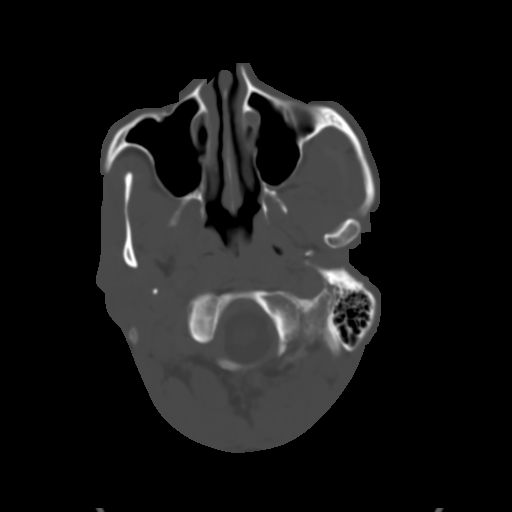
[im 10/37  brain]
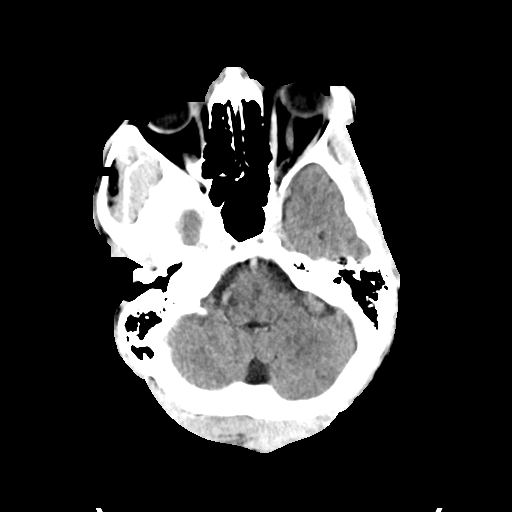
[im 14/37  brain]
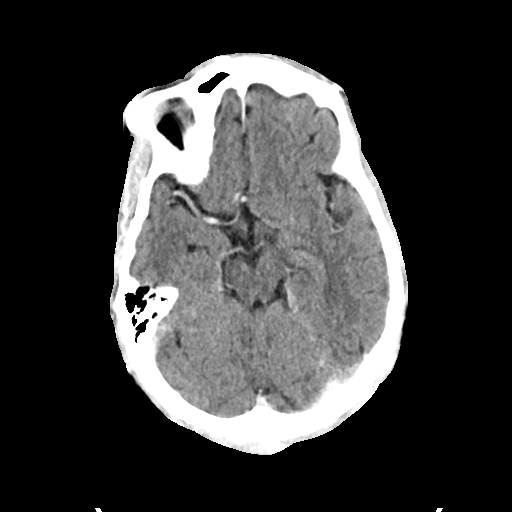
[im 19/37  brain]
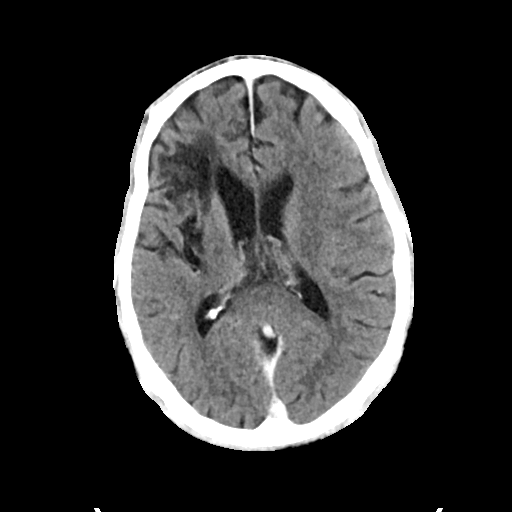
[im 23/37  brain]
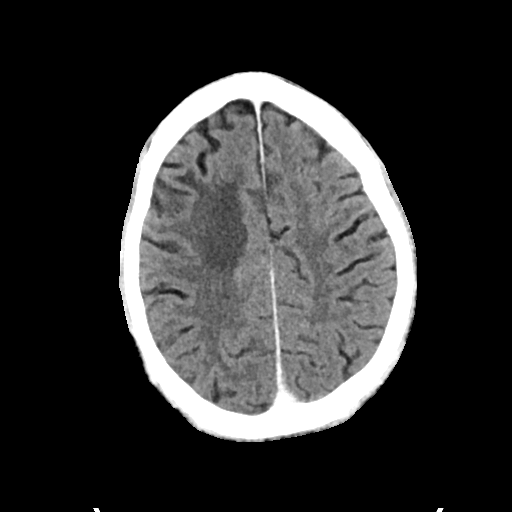
[im 23/37  bone]
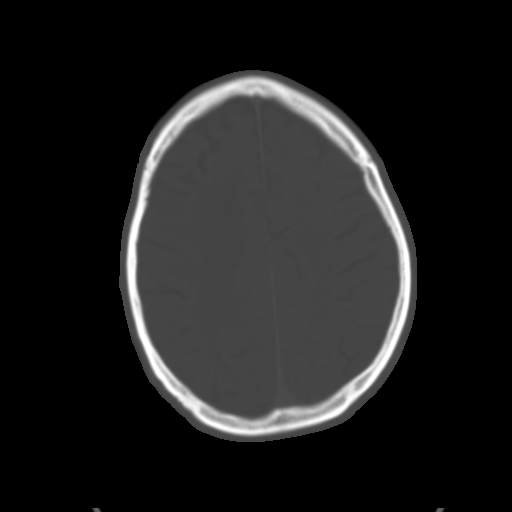
[im 28/37  brain]
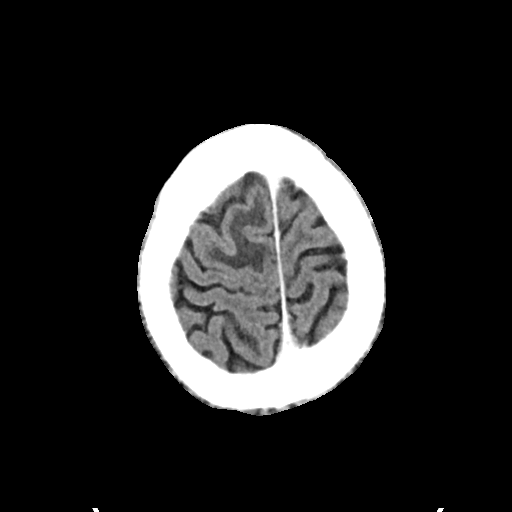
[im 32/37  brain]
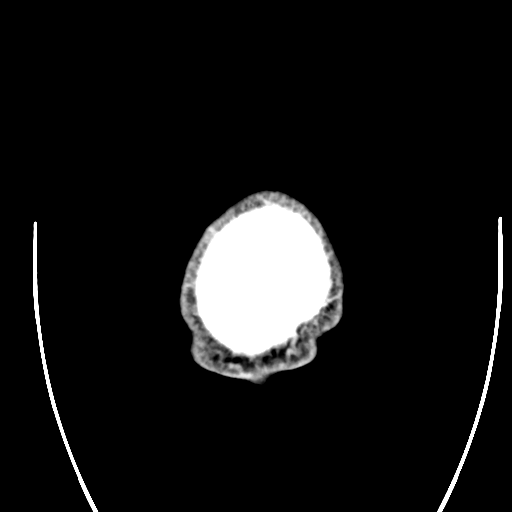

[Series 4: head bone · axial · 0.48mm/px · z∈[+1356,+1392]mm · 3 of 92 slices shown]
[im 10/92  bone]
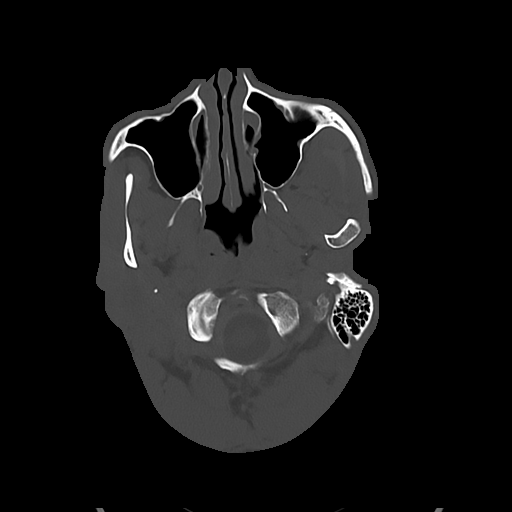
[im 19/92  bone]
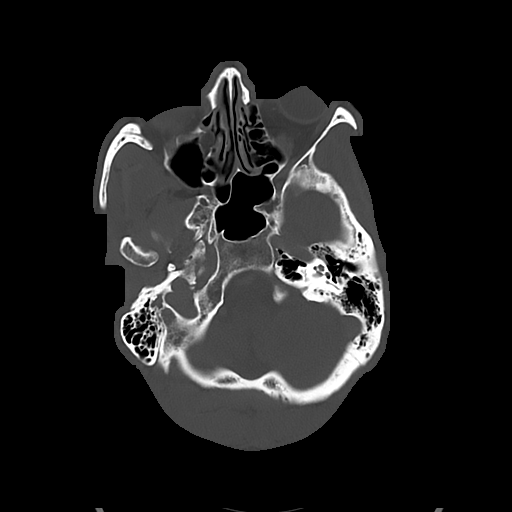
[im 28/92  bone]
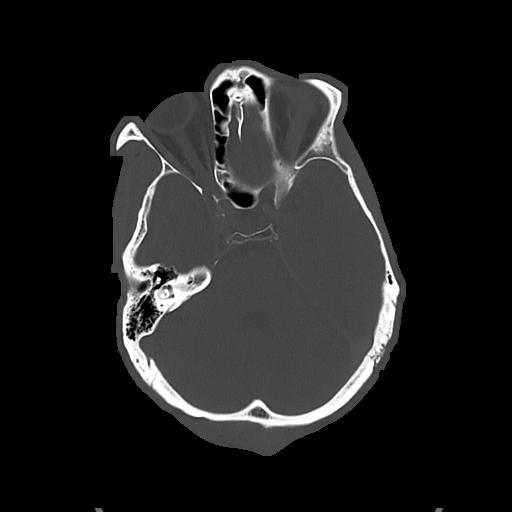

[Series 5: cor soft · coronal · 0.36mm/px · 3 of 79 slices shown]
[im 27/79  brain]
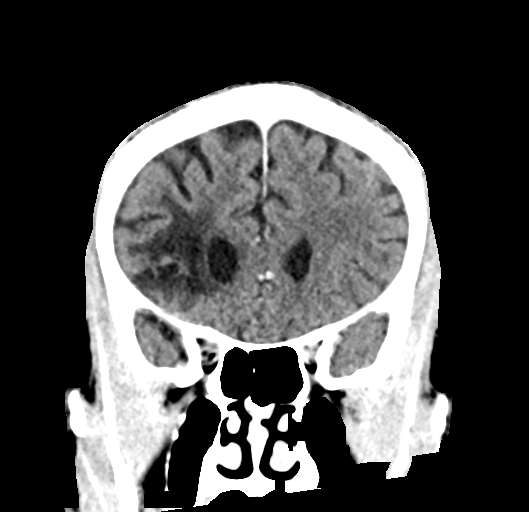
[im 35/79  brain]
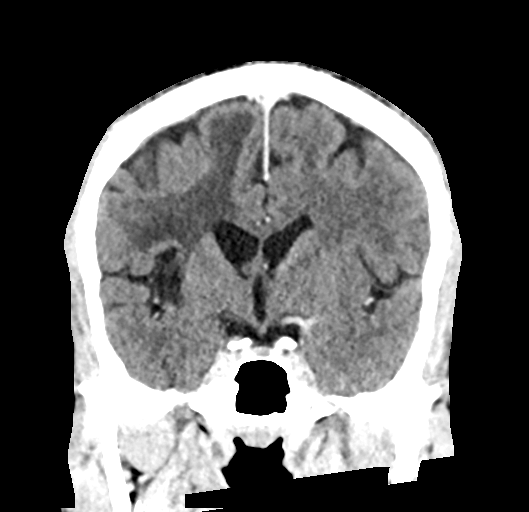
[im 44/79  brain]
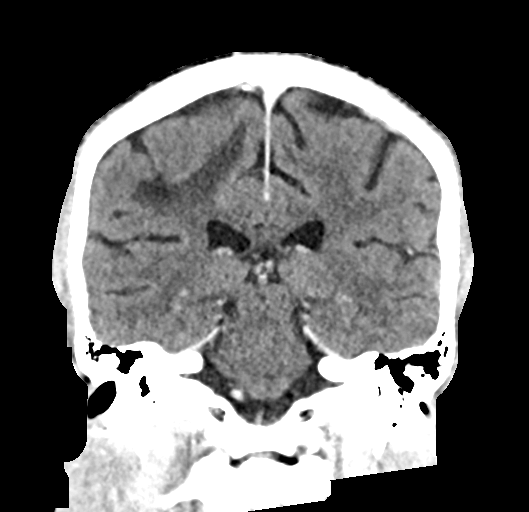

[Series 6: sag soft · sagittal · 0.36mm/px · 3 of 63 slices shown]
[im 24/63  brain]
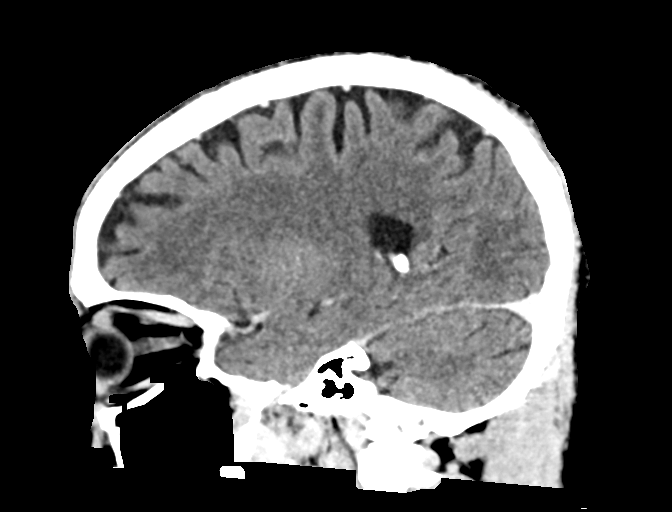
[im 32/63  brain]
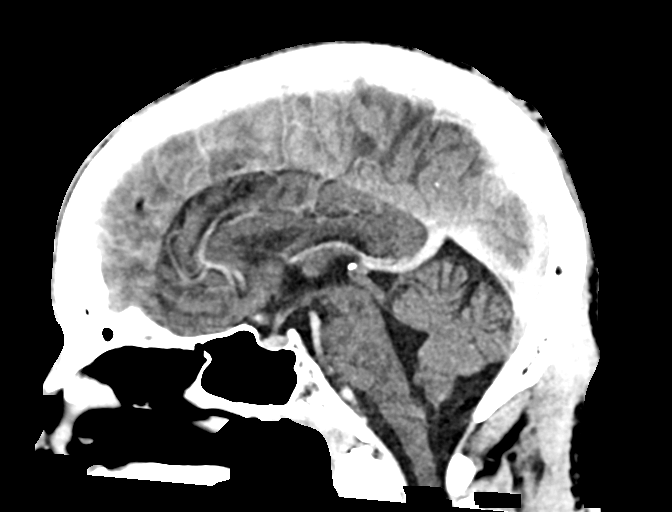
[im 39/63  brain]
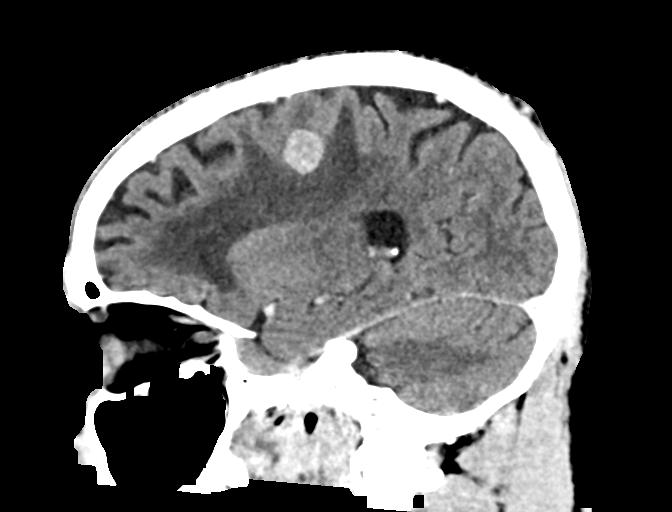

[16 of 47 positions shown; findings below may reference images not displayed]

FINDINGS: Brain: Postcontrast images confirm a homogeneously enhancing mass
lesion in the posterior right frontal lobe measuring 1.4 x 1.5 x
cm. Surrounding vasogenic edema is present with partial effacement
of sulci. No significant midline shift is present. No other foci of
enhancement are present.

Remote infarcts involving the right insular cortex as well as the
anterior frontal lobe and precentral gyrus are again noted.

The brainstem and cerebellum are within normal limits. The left
hemisphere is unremarkable.

Vascular: Contrast is present in the major vascular structures.

Skull: Calvarium is intact. No focal lytic or blastic lesions are
present. No significant extracranial soft tissue lesion is present.

Sinuses/Orbits: The paranasal sinuses and mastoid air cells are
clear. The globes and orbits are within normal limits.
IMPRESSION: 1. 1.4 x 1.5 x 1.5 cm homogeneously enhancing mass lesion in the
posterior right frontal lobe with surrounding vasogenic edema and
partial effacement of sulci. This most likely represents a solitary
metastasis. Primary brain tumor is not excluded.
2. No significant midline shift.
3. Remote infarcts involving the right insular cortex and anterior
frontal lobe and precentral gyrus.

## 2019-12-23 MED ORDER — WARFARIN SODIUM 7.5 MG PO TABS: 3.7500 mg | ORAL_TABLET | ORAL | Status: DC

## 2019-12-23 MED ORDER — INSULIN ASPART 100 UNIT/ML ~~LOC~~ SOLN
0.0000 [IU] | Freq: Three times a day (TID) | SUBCUTANEOUS | Status: DC
  Administered 2019-12-23: 5 [IU] via SUBCUTANEOUS
  Administered 2019-12-24: 3 [IU] via SUBCUTANEOUS
  Administered 2019-12-24 (×2): 1 [IU] via SUBCUTANEOUS
  Administered 2019-12-25: 5 [IU] via SUBCUTANEOUS
  Administered 2019-12-25: 3 [IU] via SUBCUTANEOUS

## 2019-12-23 MED ORDER — CANAGLIFLOZIN 100 MG PO TABS
100.0000 mg | ORAL_TABLET | Freq: Every day | ORAL | Status: DC
  Administered 2019-12-24 – 2019-12-27 (×4): 100 mg via ORAL
  Filled 2019-12-23 (×6): qty 1

## 2019-12-23 MED ORDER — SODIUM CHLORIDE 0.9 % IV BOLUS
500.0000 mL | Freq: Once | INTRAVENOUS | Status: AC
  Administered 2019-12-23: 500 mL via INTRAVENOUS

## 2019-12-23 MED ORDER — WARFARIN SODIUM 2.5 MG PO TABS
3.7500 mg | ORAL_TABLET | Freq: Once | ORAL | Status: AC
  Administered 2019-12-23: 3.75 mg via ORAL
  Filled 2019-12-23: qty 1

## 2019-12-23 MED ORDER — ATORVASTATIN CALCIUM 10 MG PO TABS
20.0000 mg | ORAL_TABLET | Freq: Every day | ORAL | Status: DC
  Administered 2019-12-23 – 2019-12-28 (×6): 20 mg via ORAL
  Filled 2019-12-23 (×6): qty 2

## 2019-12-23 MED ORDER — CARVEDILOL 6.25 MG PO TABS
9.3750 mg | ORAL_TABLET | Freq: Two times a day (BID) | ORAL | Status: DC
  Administered 2019-12-24 – 2019-12-28 (×9): 9.375 mg via ORAL
  Filled 2019-12-23 (×10): qty 1

## 2019-12-23 MED ORDER — SACUBITRIL-VALSARTAN 97-103 MG PO TABS
1.0000 | ORAL_TABLET | Freq: Two times a day (BID) | ORAL | Status: DC
  Administered 2019-12-23: 1 via ORAL
  Filled 2019-12-23 (×2): qty 1

## 2019-12-23 MED ORDER — DEXAMETHASONE 4 MG PO TABS
6.0000 mg | ORAL_TABLET | Freq: Two times a day (BID) | ORAL | Status: DC
  Administered 2019-12-23 – 2019-12-26 (×6): 6 mg via ORAL
  Filled 2019-12-23 (×6): qty 1

## 2019-12-23 MED ORDER — SODIUM CHLORIDE 0.9% FLUSH
3.0000 mL | Freq: Once | INTRAVENOUS | Status: AC
  Administered 2019-12-23: 3 mL via INTRAVENOUS

## 2019-12-23 MED ORDER — DEXAMETHASONE SODIUM PHOSPHATE 10 MG/ML IJ SOLN
10.0000 mg | Freq: Once | INTRAMUSCULAR | Status: AC
  Administered 2019-12-23: 10 mg via INTRAVENOUS
  Filled 2019-12-23: qty 1

## 2019-12-23 MED ORDER — WARFARIN - PHARMACIST DOSING INPATIENT: Freq: Every day | Status: DC

## 2019-12-23 MED ORDER — IOHEXOL 300 MG/ML  SOLN
100.0000 mL | Freq: Once | INTRAMUSCULAR | Status: AC | PRN
  Administered 2019-12-23: 100 mL via INTRAVENOUS

## 2019-12-23 MED ORDER — INSULIN GLARGINE 100 UNIT/ML ~~LOC~~ SOLN
20.0000 [IU] | Freq: Every day | SUBCUTANEOUS | Status: DC
  Administered 2019-12-23 – 2019-12-24 (×2): 20 [IU] via SUBCUTANEOUS
  Filled 2019-12-23 (×5): qty 0.2

## 2019-12-23 MED ORDER — SPIRONOLACTONE 25 MG PO TABS
25.0000 mg | ORAL_TABLET | Freq: Every day | ORAL | Status: DC
  Administered 2019-12-23: 25 mg via ORAL
  Filled 2019-12-23: qty 1

## 2019-12-23 NOTE — ED Provider Notes (Signed)
[facility] EMERGENCY DEPARTMENT Provider Note   CSN: [id number] Arrival date & time: 12/23/19  1031     History Chief Complaint  Patient presents with   Extremity Weakness    Dennis [name]. is a 59 y.o. male.  HPI Patient presents with left arm weakness and some left leg weakness for around 5 days.  Has waxed and waned somewhat but has had weakness enough that he had a fall.  Had a headache and nausea but that is resolved.  Does smoke but no known cancer history.  No fever.  No cough.  Has an AICD and is on Coumadin.  Coumadin level checked today and was 3.  No confusion.  No weight loss.    Past Medical History:  Diagnosis Date   Automatic implantable cardioverter-defibrillator in situ    CHF (congestive heart failure) ([place])    Chronic systolic heart failure ([place])    a) Mixed ICM/NICM b) RHC (05/2014): RA 2, RV 19/2/3, PA 22/14 (18), PCWP 6, Fick CO/CI: 5.2 / 2.7, PVR 2.3 WU, PA 60% and 64% c) ECHO (05/2014): EF 20-25%, diff HK, akinesis entireanteroseptal myocardium, triv AI, mod MR, LA mod/sev dilated   Drug abuse and dependence (HCC)    Heart murmur    High cholesterol    Ischemic cardiomyopathy    a) Coronary angiography (12/2008) at [facility]: [place]: nl, LAD mid 100% stenosis with left to left and right-to-left collaterals to the distal LAD; Lcx: nl, RCA nl.     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[place])    Pneumonia 1988   Sleep apnea    "cleared after T&A"   Type II diabetes mellitus ([place])     Patient Active Problem List   Diagnosis Date Noted   Chest pain 09/04/2018   Cocaine abuse with cocaine-induced mood disorder ([place]) 08/20/2015   Alcohol abuse with alcohol-induced mood disorder ([place]) 08/18/2015   MDD (major depressive disorder), recurrent severe, without psychosis ([place]) 08/18/2015   Substance induced mood disorder ([place]) 08/17/2015   Psychoactive substance-induced mood disorder ([place]) 07/17/2015   Open wound of foot 02/27/2015    HLD (hyperlipidemia)    Status post PICC central line placement    History of ischemic right MCA stroke    Type 2 diabetes mellitus with complication (HCC)    Stroke ([place])    Stroke with cerebral ischemia (HCC)    CVA (cerebral infarction) 01/26/2015   Left-sided weakness    Embolic stroke involving right middle cerebral artery (HCC)    Cardiac LV ejection fraction 10-20%    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[place])    Cocaine abuse ([place]) 01/24/2015   H/O noncompliance with medical treatment, presenting hazards to health 01/24/2015   Tobacco abuse 01/24/2015   Essential hypertension 01/24/2015   Hypotension 01/24/2015   Hyperlipidemia 01/24/2015   Dysphagia following cerebral infarction 01/24/2015   Hemiplegia affecting left nondominant side ([place]) 01/24/2015   Diabetes mellitus type 2, uncontrolled, with complications ([place]) 27/03/2375   Hyperlipidemia associated with type 2 diabetes mellitus ([place]) 11/30/2014   CAD (coronary artery disease) 11/16/2014   Chest pain on breathing 28/31/5176   Chronic systolic heart failure ([place]) 06/20/2014   Automatic implantable cardioverter-defibrillator in situ    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[place]) 05/27/2014   Dyspnea 05/26/2014   Chest discomfort 08/09/2013   Long term (current) use of anticoagulants 08/09/2013   Drug abuse and dependence ([place])    CHF (congestive heart failure) (HCC)    BP (high blood pressure) 03/04/2013   Cardiomyopathy, ischemic 07/21/2011  Type II diabetes mellitus with ophthalmic manifestations ([place]) 09/13/2007   WEIGHT LOSS, ABNORMAL 09/13/2007   SOB 09/13/2007    Past Surgical History:  Procedure Laterality Date   CARDIAC CATHETERIZATION  05/2014   CARDIAC DEFIBRILLATOR PLACEMENT  03/2011   CHOLECYSTECTOMY  1994   FOREARM FRACTURE SURGERY Left 1980   FRACTURE SURGERY     RIGHT HEART CATHETERIZATION N/A 05/30/2014   Procedure: RIGHT HEART CATH;  Surgeon: Jolaine Artist,  MD;  Location: [facility] CATH LAB;  Service: Cardiovascular;  Laterality: N/A;   RIGHT HEART CATHETERIZATION N/A 02/07/2015   Procedure: RIGHT HEART CATH;  Surgeon: Jolaine Artist, MD;  Location: [facility] CATH LAB;  Service: Cardiovascular;  Laterality: N/A;   TONSILLECTOMY AND ADENOIDECTOMY  ~ 1998       Family History  Problem Relation Age of Onset   Diabetes Mother    Heart disease Mother    Diabetes Father    Prostate cancer Father    Heart disease Father    Diabetes Brother    Diabetes Brother     Social History   Tobacco Use   Smoking status: Current Some Day Smoker    Packs/day: 0.25    Years: 31.00    Pack years: 7.75    Types: Cigarettes   Smokeless tobacco: Never Used  Substance Use Topics   Alcohol use: Yes    Alcohol/week: 0.0 standard drinks    Comment: 09/21/2014 "might have a drink q 6 /months, if that"   Drug use: Yes    Types: Cocaine    Comment: 09/21/2014 "last cocaine ~ 1 1/2 months ago"    Home Medications Prior to Admission medications   Medication Sig Start Date End Date Taking? Authorizing Provider  atorvastatin (LIPITOR) 20 MG tablet TAKE 1 TABLET BY MOUTH DAILY AT 6 PM. Patient taking differently: Take 20 mg by mouth daily at 6 PM.  03/11/17  Yes Bensimhon, Shaune Pascal, MD  carvedilol (COREG) 6.25 MG tablet Take 1.5 tablets (9.375 mg total) by mouth 2 (two) times daily with a meal. 08/05/19  Yes Bensimhon, Shaune Pascal, MD  empagliflozin (JARDIANCE) 10 MG TABS tablet Take 10 mg by mouth daily before breakfast. 08/05/19  Yes Bensimhon, Shaune Pascal, MD  ENTRESTO 97-103 MG TAKE 1 TABLET BY MOUTH TWO TIMES DAILY. Patient taking differently: Take 1 tablet by mouth 2 (two) times daily. May crush. May be given with or without food. Observe for signs and symptoms of angioedema or hypotension. Entresto (sacubitril/valsartan) should NOT be administered within 36-hours of ACE-inhibitor administration. 10/06/19  Yes Bensimhon, Shaune Pascal, MD  Insulin Glargine  (LANTUS SOLOSTAR) 100 UNIT/ML Solostar Pen Inject 32 Units into the skin daily at 10 pm.    Yes [provider]  metFORMIN (GLUCOPHAGE) 500 MG tablet Take 500 mg by mouth 2 (two) times daily with a meal.   Yes [provider]  spironolactone (ALDACTONE) 25 MG tablet Take 1 tablet (25 mg total) by mouth daily. 12/15/18  Yes Shirley Friar, PA-C  warfarin (COUMADIN) 7.5 MG tablet Take 1/2 or 1 tablet daily as directed by Coumadin clinic Patient taking differently: Take 3.75-7.5 mg by mouth See admin instructions. Take 1/2  (3.75) mg on Monday , Wed, Friday and 1 tablet 7.5mg  all other days. directed by Coumadin clinic 12/24/18  Yes Bensimhon, Shaune Pascal, MD  tadalafil (CIALIS) 20 MG tablet Take 1 tablet (20 mg total) by mouth daily as needed for erectile dysfunction. Patient not taking: Reported on 12/23/2019 08/26/16  Shirley Friar, PA-C    Allergies    Patient has no known allergies.  Review of Systems   Review of Systems  Constitutional: Negative for appetite change.  Respiratory: Negative for shortness of breath.   Cardiovascular: Negative for chest pain and leg swelling.  Gastrointestinal: Negative for abdominal pain.  Genitourinary: Negative for flank pain.  Musculoskeletal: Negative for back pain.  Skin: Negative for rash.  Neurological: Positive for weakness and headaches.    Physical Exam Updated Vital Signs BP 111/82    Pulse 83    Temp 98.2 F (36.8 C) (Oral)    Resp 20    Ht 6' (1.829 m)    Wt 77.1 kg    SpO2 100%    BMI 23.06 kg/m   Physical Exam Vitals and nursing note reviewed.  HENT:     Head: Atraumatic.     Left Ear: Tympanic membrane normal.  Eyes:     Extraocular Movements: Extraocular movements intact.     Pupils: Pupils are equal, round, and reactive to light.  Cardiovascular:     Rate and Rhythm: Regular rhythm.  Pulmonary:     Breath sounds: No wheezing or rhonchi.  Abdominal:     Tenderness: There is no abdominal  tenderness.  Musculoskeletal:        General: No tenderness.  Skin:    General: Skin is warm.  Neurological:     Mental Status: He is alert.     Comments: External eye movements intact.  Good grip strength and movement on right.  Weakness on left upper extremity and left lower extremity compared to the right.     ED Results / Procedures / Treatments   Labs (all labs ordered are listed, but only abnormal results are displayed) Labs Reviewed  PROTIME-INR - Abnormal; Notable for the following components:      Result Value   Prothrombin Time 25.9 (*)    INR 2.4 (*)    All other components within normal limits  APTT - Abnormal; Notable for the following components:   aPTT 45 (*)    All other components within normal limits  I-STAT CHEM 8, ED - Abnormal; Notable for the following components:   BUN 27 (*)    Creatinine, Ser 1.70 (*)    Glucose, Bld 132 (*)    Calcium, Ion 1.11 (*)    All other components within normal limits  CBG MONITORING, ED - Abnormal; Notable for the following components:   Glucose-Capillary 118 (*)    All other components within normal limits  CBC  DIFFERENTIAL  COMPREHENSIVE METABOLIC PANEL    EKG None  Radiology CT HEAD WO CONTRAST  Result Date: 12/23/2019 CLINICAL DATA:  Left arm weakness since Monday or Tuesday. Fall on Wednesday night. EXAM: CT HEAD WITHOUT CONTRAST TECHNIQUE: Contiguous axial images were obtained from the base of the skull through the vertex without intravenous contrast. COMPARISON:  01/24/2015 FINDINGS: Brain: 14 mm dense intra-axial mass with moderate vasogenic edema in the high right frontal lobe. Remote infarct in the right frontal operculum and insula. No acute hemorrhage or hydrocephalus. No acute infarct. Vascular: No hyperdense vessel. Skull: No evidence of fracture or bone lesion Sinuses/Orbits: Negative IMPRESSION: 1. 14 mm right frontal mass with vasogenic edema. Recommend brain MRI with contrast. 2. Chronic right insular  and opercular infarct. Electronically Signed   By: Monte Fantasia M.D.   On: 12/23/2019 11:16    Procedures Procedures (including critical care time)  Medications Ordered in ED  Medications  dexamethasone (DECADRON) injection 10 mg (has no administration in time range)  sodium chloride flush (NS) 0.9 % injection 3 mL (3 mLs Intravenous Given 12/23/19 1112)    ED Course  I have reviewed the triage vital signs and the nursing notes.  Pertinent labs & imaging results that were available during my care of the patient were reviewed by me and considered in my medical decision making (see chart for details).    MDM Rules/Calculators/A&P                      Patient with left-sided weakness.  Began around 5 days ago.  Initial head CT showed likely brain mass with edema.Chest x-ray from 2 months ago did not show any mass although he does smoke.  Will need work-up for metastatic disease and further imaging of the head.  CT head with contrast versus potentially MRI although this would be limited somewhat by the pacemaker which I do not know if it is compatible or not.  Will admit to unassigned medicine. Final Clinical Impression(s) / ED Diagnoses Final diagnoses:  Brain mass    Rx / DC Orders ED Discharge Orders    None       Davonna Belling, MD 12/23/19 1223

## 2019-12-23 NOTE — ED Triage Notes (Signed)
Pt reports left arm weakness since Monday or Tuesday. Hx of CVA 5 years ago. Reports HA and nausea earlier this week. Left hand grip strength weaker than right, able to move arm. States his BP is normally low.

## 2019-12-23 NOTE — Patient Instructions (Signed)
Description   Continue taking 1 tablet every day except 1/2 tablet on Mondays, Wednesdays, and Fridays. Eat a extra serving of greens today.  Recheck in 6 weeks.  Main # [phone] Coumadin Clinic # [phone].

## 2019-12-23 NOTE — H&P (Signed)
[facility] Admission History and Physical Service Pager: [phone]  Patient name: Dennis [name]. Medical record number: [id number] Date of birth: 1960-12-31 Age: 59 y.o. Gender: male  Primary Care Provider: System, Pcp Not In Consultants: none Code Status: full Preferred Emergency Contact: mom jairus tonne) [phone]  Chief Complaint: Left sided- weakness  Assessment and Plan: Oleg Braxley Balandran. is a 59 y.o. male presenting with acute onset left-sided weakness. PMH is significant for type 2 diabetes, CHF with implantable defibrillator, tobacco use, warfarin use  Cerebral mass-likely malignant.  Resulting in acute onset and persistent left-sided weakness and speech slurring since Monday.  Patient has not noticed any facial droop.  Has also had intermittent blurred vision over the past couple of months as well as chills and unintentional weight loss. Head CT on admission shows 14 mm right frontal mass with vasogenic edema and chronic right insular and opercular infarct.  MRI with contrast is recommended but patient has implantable defibrillator which is not compatible. Consulted radiology for the next best imaging study. They recommended head/chest/abdomen/pelvis CT with contrast as the brain mass is suggestive of metastatic lesion so will evaluate for primary source as well. Creatinine is 1.64 currently. Does not appear fluid overloaded on exam, lungs are clear and no LE edema. Patient is aware of the mass and is appropriately concerned.  History of smoking for greater than 40 years. No history of cancer. Chest x-ray 09/2019 did not show pulmonary lesions but was positive for emphysema. Consider infectious source as much lower on differential. CBC wnl, no history of immunocompromise. -Admit to MedSurg, attending Dr. McDiarmid -given AKI, giving small fluid bolus prior to contrast - f/u head/chest/abdomen/pelvis CT - consult heme/onc with results - f/u HIV.   Type  2 diabetes mellitus-Home meds equal 32 units Lantus nightly, Jardiance and metformin twice daily. Last A1c in chart 11.9 08/2015 -Daily Lantus initially 20 units -Monitor CBGs and increase insulin as needed -Continue Jardiance -Sliding scale insulin -Hold Metformin - hgb A1c in am  HFrEF, HTN-Echo 08/2018 showed ejection fraction 20 to 25% with moderate LVH, severe LVE, trace AI, mild MR, moderate LAE, mild TR.  BP low/stable on admission. Patient has implantable defibrillator seen properly placed on x-ray 09/2019.  Placed at [facility] 2012 and being monitored by heart care which just downloaded on Monday.  Home meds include carvedilol, Entresto, spironolactone, warfarin  -Continue home medications - monitor INR  H/o stroke, HLD- home meds include entresto, warfarin - continue home meds  Tobacco use- >40year pack history. Evaluate for lung malignancy. - f/u CT - counsel for tobacco cessation   FEN/GI: Heart healthy/carb modified Prophylaxis: Lovenox  Disposition: Pending mass work-up  History of Present Illness:  Dennis [name]. is a 59 y.o. male presenting with acute onset left-sided body weakness which has been persistent since onset Monday. Patient was walking at the gym and noticed that his left arm was not swinging like his right arm does normally.  Also noticed that his left leg seemed heavier and he had to drag it.  He noticed some slurred speech which was mild and did not notice any type of facial droop.  Throughout the course of the week, patient noticed that he had weak grip with some numbing and tingling on his left side which is intermittent.  The character of the weakness has not changed since onset.  Has been having intermittent blurry vision bilateral for several months which resolved spontaneously after a few minutes. Patient has had 2  falls without injury including one time from a seated chair and one time from the toilet where he lost balance leaning over and fell to the  floor.  Denies loss of consciousness or head injury. Denies any dysuria but in fact thinks that he has increased urination with some incontinence due to loss of sensation for urine stream. Also endorses chills without fevers or sweating.  Seems like he is losing weight unintentionally. Denies seeing any providers about these problems until now and decided to come to the emergency department because he was across the street at [facility] clinic for an INR check today which was 3.1.  Hasn't taken any medications since last night.   Smoke- 10-12 per day. 40+ years. Denies alcohol or drugs.  Review Of Systems: Per HPI with the following additions:  Review of Systems  Constitutional: Positive for chills and weight loss.  Eyes: Positive for blurred vision.  Respiratory: Positive for shortness of breath. Negative for cough.   Cardiovascular: Negative for chest pain.  Gastrointestinal: Positive for nausea. Negative for abdominal pain and blood in stool.  Genitourinary: Positive for frequency. Negative for dysuria.  Skin: Negative for rash.  Neurological: Positive for tingling, sensory change, speech change, focal weakness and weakness. Negative for dizziness, seizures, loss of consciousness and headaches.    Patient Active Problem List   Diagnosis Date Noted  . Brain mass 12/23/2019  . Chest pain 09/04/2018  . Cocaine abuse with cocaine-induced mood disorder ([place]) 08/20/2015  . Alcohol abuse with alcohol-induced mood disorder ([place]) 08/18/2015  . MDD (major depressive disorder), recurrent severe, without psychosis ([place]) 08/18/2015  . Substance induced mood disorder ([place]) 08/17/2015  . Psychoactive substance-induced mood disorder ([place]) 07/17/2015  . Open wound of foot 02/27/2015  . HLD (hyperlipidemia)   . Status post PICC central line placement   . History of ischemic right MCA stroke   . Type 2 diabetes mellitus with complication ([place])   . Stroke ([place])   . Stroke with cerebral ischemia  ([place])   . CVA (cerebral infarction) 01/26/2015  . Left-sided weakness   . Embolic stroke involving right middle cerebral artery ([place])   . Cardiac LV ejection fraction 10-20%   . 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[place])   . Cocaine abuse ([place]) 01/24/2015  . H/O noncompliance with medical treatment, presenting hazards to health 01/24/2015  . Tobacco abuse 01/24/2015  . Essential hypertension 01/24/2015  . Hypotension 01/24/2015  . Hyperlipidemia 01/24/2015  . Dysphagia following cerebral infarction 01/24/2015  . Hemiplegia affecting left nondominant side ([place]) 01/24/2015  . Diabetes mellitus type 2, uncontrolled, with complications ([place]) 24/58/0998  . Hyperlipidemia associated with type 2 diabetes mellitus ([place]) 11/30/2014  . CAD (coronary artery disease) 11/16/2014  . Chest pain on breathing 09/21/2014  . Chronic systolic heart failure ([place]) 06/20/2014  . Automatic implantable cardioverter-defibrillator in situ   . 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[place]) 05/27/2014  . Dyspnea 05/26/2014  . Chest discomfort 08/09/2013  . Long term (current) use of anticoagulants 08/09/2013  . Drug abuse and dependence ([place])   . CHF (congestive heart failure) ([place])   . BP (high blood pressure) 03/04/2013  . Cardiomyopathy, ischemic 07/21/2011  . Type II diabetes mellitus with ophthalmic manifestations ([place]) 09/13/2007  . WEIGHT LOSS, ABNORMAL 09/13/2007  . SOB 09/13/2007    Past Medical History: Past Medical History:  Diagnosis Date  . Automatic implantable cardioverter-defibrillator in situ   . CHF (congestive heart failure) ([place])   . Chronic systolic heart failure (HCC)    a) Mixed  ICM/NICM b) RHC (05/2014): RA 2, RV 19/2/3, PA 22/14 (18), PCWP 6, Fick CO/CI: 5.2 / 2.7, PVR 2.3 WU, PA 60% and 64% c) ECHO (05/2014): EF 20-25%, diff HK, akinesis entireanteroseptal myocardium, triv AI, mod MR, LA mod/sev dilated  . Drug abuse and dependence ([place])   . Heart murmur   . High cholesterol   . Ischemic  cardiomyopathy    a) Coronary angiography (12/2008) at [facility]: Lmain: nl, LAD mid 100% stenosis with left to left and right-to-left collaterals to the distal LAD; Lcx: nl, RCA nl.    . 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[place])   . Pneumonia 1988  . Sleep apnea    "cleared after T&A"  . Type II diabetes mellitus ([place])     Past Surgical History: Past Surgical History:  Procedure Laterality Date  . CARDIAC CATHETERIZATION  05/2014  . CARDIAC DEFIBRILLATOR PLACEMENT  03/2011  . CHOLECYSTECTOMY  1994  . FOREARM FRACTURE SURGERY Left 1980  . FRACTURE SURGERY    . RIGHT HEART CATHETERIZATION N/A 05/30/2014   Procedure: RIGHT HEART CATH;  Surgeon: Jolaine Artist, MD;  Location: [facility] CATH LAB;  Service: Cardiovascular;  Laterality: N/A;  . RIGHT HEART CATHETERIZATION N/A 02/07/2015   Procedure: RIGHT HEART CATH;  Surgeon: Jolaine Artist, MD;  Location[facility] CATH LAB;  Service: Cardiovascular;  Laterality: N/A;  . TONSILLECTOMY AND ADENOIDECTOMY  ~ 1998    Social History: Social History   Tobacco Use  . Smoking status: Current Some Day Smoker    Packs/day: 0.25    Years: 31.00    Pack years: 7.75    Types: Cigarettes  . Smokeless tobacco: Never Used  Substance Use Topics  . Alcohol use: Yes    Alcohol/week: 0.0 standard drinks    Comment: 09/21/2014 "might have a drink q 6 /months, if that"  . Drug use: Yes    Types: Cocaine    Comment: 09/21/2014 "last cocaine ~ 1 1/2 months ago"   Additional social history: See HPI Please also refer to relevant sections of EMR.  Family History: Family History  Problem Relation Age of Onset  . Diabetes Mother   . Heart disease Mother   . Diabetes Father   . Prostate cancer Father   . Heart disease Father   . Diabetes Brother   . Diabetes Brother     Allergies and Medications: No Known Allergies No current facility-administered medications on file prior to encounter.   Current Outpatient Medications on File Prior to Encounter  Medication  Sig Dispense Refill  . atorvastatin (LIPITOR) 20 MG tablet TAKE 1 TABLET BY MOUTH DAILY AT 6 PM. (Patient taking differently: Take 20 mg by mouth daily at 6 PM. ) 30 tablet 6  . carvedilol (COREG) 6.25 MG tablet Take 1.5 tablets (9.375 mg total) by mouth 2 (two) times daily with a meal. 90 tablet 3  . empagliflozin (JARDIANCE) 10 MG TABS tablet Take 10 mg by mouth daily before breakfast. 30 tablet 6  . ENTRESTO 97-103 MG TAKE 1 TABLET BY MOUTH TWO TIMES DAILY. (Patient taking differently: Take 1 tablet by mouth 2 (two) times daily. May crush. May be given with or without food. Observe for signs and symptoms of angioedema or hypotension. Entresto (sacubitril/valsartan) should NOT be administered within 36-hours of ACE-inhibitor administration.) 60 tablet 0  . Insulin Glargine (LANTUS SOLOSTAR) 100 UNIT/ML Solostar Pen Inject 32 Units into the skin daily at 10 pm.     . metFORMIN (GLUCOPHAGE) 500 MG tablet Take 500 mg  by mouth 2 (two) times daily with a meal.    . spironolactone (ALDACTONE) 25 MG tablet Take 1 tablet (25 mg total) by mouth daily. 90 tablet 3  . warfarin (COUMADIN) 7.5 MG tablet Take 1/2 or 1 tablet daily as directed by Coumadin clinic (Patient taking differently: Take 3.75-7.5 mg by mouth See admin instructions. Take 1/2  (3.75) mg on Monday , Wed, Friday and 1 tablet 7.5mg  all other days. directed by Coumadin clinic) 30 tablet 0  . tadalafil (CIALIS) 20 MG tablet Take 1 tablet (20 mg total) by mouth daily as needed for erectile dysfunction. (Patient not taking: Reported on 12/23/2019) 10 tablet 0    Objective: BP 113/69   Pulse 86   Temp 98.2 F (36.8 C) (Oral)   Resp 19   Ht 6' (1.829 m)   Wt 77.1 kg   SpO2 98%   BMI 23.06 kg/m  Exam: General: Well-appearing man in no acute distress resting comfortably in bed Eyes: PERRL, extraocular movements intact bilaterally, visual acuity grossly intact, negative for jaundice, negative nystagmus ENTM: Dry mucous membranes, poor  dentition Neck: Negative for tenderness to palpation, soft.  Positive for C7 swelling Cardiovascular: Regular rate and rhythm, negative murmur, defibrillator on left chest Respiratory: Clear bilaterally without rales or wheezes, normal respiratory effort Gastrointestinal: Soft, nontender, nondistended MSK: Normal Derm: Negative for rashes or lesions Neuro: Cranial nerves II through XII grossly intact, sensation on bilateral limbs symmetrical, symmetrical grip strength bilaterally in upper extremities.  Positive for slightly decreased strength with shoulder and hip strength and upper and lower left extremities compared to right. Psych: Seems to be well adjusted to the news of the possible malignancy in the brain with some appropriate anxiety  Labs and Imaging: CBC BMET  Recent Labs  Lab 12/23/19 1056 12/23/19 1056 12/23/19 1132  WBC 9.9  --   --   HGB 13.1   < > 14.3  HCT 42.8   < > 42.0  PLT 280  --   --    < > = values in this interval not displayed.   Recent Labs  Lab 12/23/19 1056 12/23/19 1056 12/23/19 1132  NA 139   < > 139  K 4.6   < > 4.6  CL 107   < > 108  CO2 21*  --   --   BUN 25*   < > 27*  CREATININE 1.64*   < > 1.70*  GLUCOSE 135*   < > 132*  CALCIUM 9.1  --   --    < > = values in this interval not displayed.     EKG: NSR, LVH  CT HEAD WO CONTRAST  Result Date: 12/23/2019 CLINICAL DATA:  Left arm weakness since Monday or Tuesday. Fall on Wednesday night. EXAM: CT HEAD WITHOUT CONTRAST TECHNIQUE: Contiguous axial images were obtained from the base of the skull through the vertex without intravenous contrast. COMPARISON:  01/24/2015 FINDINGS: Brain: 14 mm dense intra-axial mass with moderate vasogenic edema in the high right frontal lobe. Remote infarct in the right frontal operculum and insula. No acute hemorrhage or hydrocephalus. No acute infarct. Vascular: No hyperdense vessel. Skull: No evidence of fracture or bone lesion Sinuses/Orbits: Negative IMPRESSION:  1. 14 mm right frontal mass with vasogenic edema. Recommend brain MRI with contrast. 2. Chronic right insular and opercular infarct. Electronically Signed   By: Monte Fantasia M.D.   On: 12/23/2019 11:16     Richarda Osmond, DO 12/23/2019, 1:53 PM PGY-2, [facility] Intern pager: [phone], text pages welcome

## 2019-12-23 NOTE — Progress Notes (Signed)
ANTICOAGULATION CONSULT NOTE - Initial Consult  Pharmacy Consult for warfarin Indication: Left ventricular noncompaction  No Known Allergies  Patient Measurements: Height: 6' (182.9 cm) Weight: 170 lb (77.1 kg) IBW/kg (Calculated) : 77.6  Vital Signs: Temp: 98.2 F (36.8 C) (03/12 1434) Temp Source: Oral (03/12 1434) BP: 109/85 (03/12 1434) Pulse Rate: 88 (03/12 1434)  Labs: Recent Labs    12/23/19 0857 12/23/19 1056 12/23/19 1132  HGB  --  13.1 14.3  HCT  --  42.8 42.0  PLT  --  280  --   APTT  --  45*  --   LABPROT  --  25.9*  --   INR 3.1* 2.4*  --   CREATININE  --  1.64* 1.70*    Estimated Creatinine Clearance: 52.3 mL/min (A) (by C-G formula based on SCr of 1.7 mg/dL (H)).   Medical History: Past Medical History:  Diagnosis Date  . Automatic implantable cardioverter-defibrillator in situ 2012  . Chest pain 09/04/2018  . CHF (congestive heart failure) ([place])   . Chronic systolic heart failure (HCC)    a) Mixed ICM/NICM b) RHC (05/2014): RA 2, RV 19/2/3, PA 22/14 (18), PCWP 6, Fick CO/CI: 5.2 / 2.7, PVR 2.3 WU, PA 60% and 64% c) ECHO (05/2014): EF 20-25%, diff HK, akinesis entireanteroseptal myocardium, triv AI, mod MR, LA mod/sev dilated  . Drug abuse and dependence ([place])   . Dysphagia following cerebral infarction 01/24/2015  . Embolic stroke involving right middle cerebral artery ([place])   . Heart murmur   . High cholesterol   . Ischemic cardiomyopathy    a) Coronary angiography (12/2008) at [facility]: Lmain: nl, LAD mid 100% stenosis with left to left and right-to-left collaterals to the distal LAD; Lcx: nl, RCA nl.    . 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[place])   . Open wound of foot 02/27/2015  . Pneumonia 1988  . Sleep apnea    "cleared after T&A"  . Status post PICC central line placement   . Stroke ([place])   . Type II diabetes mellitus (HCC)    Assessment: 56 YOM presenting with weakness, AICD and Left ventricular noncompaction on warfarin PTA, INR 2.4 on  admission,  CBC wnl.    PTA dosing: 3.75mg  MWF, 7.5mg  all other days  Goal of Therapy:  INR 2-3 Monitor platelets by anticoagulation protocol: Yes   Plan:  Warfarin 3.75mg  PO x 1 today Daily INR, s/s bleeding  Bertis Ruddy, PharmD Clinical Pharmacist ED Pharmacist Phone # [phone] 12/23/2019 2:51 PM

## 2019-12-24 DIAGNOSIS — R911 Solitary pulmonary nodule: Secondary | ICD-10-CM | POA: Diagnosis not present

## 2019-12-24 DIAGNOSIS — Z8042 Family history of malignant neoplasm of prostate: Secondary | ICD-10-CM | POA: Diagnosis not present

## 2019-12-24 DIAGNOSIS — Z7901 Long term (current) use of anticoagulants: Secondary | ICD-10-CM | POA: Diagnosis not present

## 2019-12-24 DIAGNOSIS — E1122 Type 2 diabetes mellitus with diabetic chronic kidney disease: Secondary | ICD-10-CM | POA: Diagnosis present

## 2019-12-24 DIAGNOSIS — R590 Localized enlarged lymph nodes: Secondary | ICD-10-CM | POA: Diagnosis not present

## 2019-12-24 DIAGNOSIS — N183 Chronic kidney disease, stage 3 unspecified: Secondary | ICD-10-CM | POA: Diagnosis present

## 2019-12-24 DIAGNOSIS — W19XXXA Unspecified fall, initial encounter: Secondary | ICD-10-CM | POA: Diagnosis present

## 2019-12-24 DIAGNOSIS — I340 Nonrheumatic mitral (valve) insufficiency: Secondary | ICD-10-CM | POA: Diagnosis not present

## 2019-12-24 DIAGNOSIS — I255 Ischemic cardiomyopathy: Secondary | ICD-10-CM | POA: Diagnosis present

## 2019-12-24 DIAGNOSIS — R918 Other nonspecific abnormal finding of lung field: Secondary | ICD-10-CM | POA: Diagnosis not present

## 2019-12-24 DIAGNOSIS — I5022 Chronic systolic (congestive) heart failure: Secondary | ICD-10-CM | POA: Diagnosis present

## 2019-12-24 DIAGNOSIS — Z833 Family history of diabetes mellitus: Secondary | ICD-10-CM | POA: Diagnosis not present

## 2019-12-24 DIAGNOSIS — E11319 Type 2 diabetes mellitus with unspecified diabetic retinopathy without macular edema: Secondary | ICD-10-CM | POA: Diagnosis not present

## 2019-12-24 DIAGNOSIS — C771 Secondary and unspecified malignant neoplasm of intrathoracic lymph nodes: Secondary | ICD-10-CM | POA: Diagnosis present

## 2019-12-24 DIAGNOSIS — F17210 Nicotine dependence, cigarettes, uncomplicated: Secondary | ICD-10-CM | POA: Diagnosis present

## 2019-12-24 DIAGNOSIS — Z8249 Family history of ischemic heart disease and other diseases of the circulatory system: Secondary | ICD-10-CM | POA: Diagnosis not present

## 2019-12-24 DIAGNOSIS — J439 Emphysema, unspecified: Secondary | ICD-10-CM | POA: Diagnosis present

## 2019-12-24 DIAGNOSIS — G936 Cerebral edema: Secondary | ICD-10-CM | POA: Diagnosis present

## 2019-12-24 DIAGNOSIS — Z8673 Personal history of transient ischemic attack (TIA), and cerebral infarction without residual deficits: Secondary | ICD-10-CM | POA: Diagnosis not present

## 2019-12-24 DIAGNOSIS — Z9049 Acquired absence of other specified parts of digestive tract: Secondary | ICD-10-CM | POA: Diagnosis not present

## 2019-12-24 DIAGNOSIS — C3490 Malignant neoplasm of unspecified part of unspecified bronchus or lung: Secondary | ICD-10-CM | POA: Diagnosis present

## 2019-12-24 DIAGNOSIS — G9389 Other specified disorders of brain: Secondary | ICD-10-CM

## 2019-12-24 DIAGNOSIS — Z20822 Contact with and (suspected) exposure to covid-19: Secondary | ICD-10-CM | POA: Diagnosis present

## 2019-12-24 DIAGNOSIS — G8194 Hemiplegia, unspecified affecting left nondominant side: Secondary | ICD-10-CM | POA: Diagnosis present

## 2019-12-24 DIAGNOSIS — I2510 Atherosclerotic heart disease of native coronary artery without angina pectoris: Secondary | ICD-10-CM | POA: Diagnosis present

## 2019-12-24 DIAGNOSIS — I130 Hypertensive heart and chronic kidney disease with heart failure and stage 1 through stage 4 chronic kidney disease, or unspecified chronic kidney disease: Secondary | ICD-10-CM | POA: Diagnosis present

## 2019-12-24 DIAGNOSIS — E1165 Type 2 diabetes mellitus with hyperglycemia: Secondary | ICD-10-CM | POA: Diagnosis present

## 2019-12-24 DIAGNOSIS — R4781 Slurred speech: Secondary | ICD-10-CM | POA: Diagnosis present

## 2019-12-24 DIAGNOSIS — N179 Acute kidney failure, unspecified: Secondary | ICD-10-CM | POA: Diagnosis present

## 2019-12-24 DIAGNOSIS — I10 Essential (primary) hypertension: Secondary | ICD-10-CM | POA: Diagnosis not present

## 2019-12-24 DIAGNOSIS — C7931 Secondary malignant neoplasm of brain: Secondary | ICD-10-CM | POA: Diagnosis present

## 2019-12-24 DIAGNOSIS — Z95810 Presence of automatic (implantable) cardiac defibrillator: Secondary | ICD-10-CM | POA: Diagnosis not present

## 2019-12-24 LAB — GLUCOSE, CAPILLARY
Glucose-Capillary: 130 mg/dL — ABNORMAL HIGH (ref 70–99)
Glucose-Capillary: 202 mg/dL — ABNORMAL HIGH (ref 70–99)
Glucose-Capillary: 130 mg/dL — ABNORMAL HIGH (ref 70–99)
Glucose-Capillary: 278 mg/dL — ABNORMAL HIGH (ref 70–99)

## 2019-12-24 LAB — CBC
HCT: 40.5 % (ref 39.0–52.0)
Hemoglobin: 12.7 g/dL — ABNORMAL LOW (ref 13.0–17.0)
MCH: 29.7 pg (ref 26.0–34.0)
MCHC: 31.4 g/dL (ref 30.0–36.0)
MCV: 94.6 fL (ref 80.0–100.0)
Platelets: 288 10*3/uL (ref 150–400)
RBC: 4.28 MIL/uL (ref 4.22–5.81)
RDW: 13.4 % (ref 11.5–15.5)
WBC: 10.9 10*3/uL — ABNORMAL HIGH (ref 4.0–10.5)
nRBC: 0 % (ref 0.0–0.2)

## 2019-12-24 LAB — PROTIME-INR
INR: 2.7 — ABNORMAL HIGH (ref 0.8–1.2)
Prothrombin Time: 28.5 seconds — ABNORMAL HIGH (ref 11.4–15.2)

## 2019-12-24 LAB — BASIC METABOLIC PANEL
Anion gap: 13 (ref 5–15)
BUN: 28 mg/dL — ABNORMAL HIGH (ref 6–20)
CO2: 23 mmol/L (ref 22–32)
Calcium: 8.8 mg/dL — ABNORMAL LOW (ref 8.9–10.3)
Chloride: 103 mmol/L (ref 98–111)
Creatinine, Ser: 1.6 mg/dL — ABNORMAL HIGH (ref 0.61–1.24)
GFR calc Af Amer: 55 mL/min — ABNORMAL LOW (ref >60)
GFR calc non Af Amer: 47 mL/min — ABNORMAL LOW (ref >60)
Glucose, Bld: 149 mg/dL — ABNORMAL HIGH (ref 70–99)
Potassium: 4 mmol/L (ref 3.5–5.1)
Sodium: 139 mmol/L (ref 135–145)

## 2019-12-24 LAB — HIV ANTIBODY (ROUTINE TESTING W REFLEX): HIV Screen 4th Generation wRfx: NONREACTIVE

## 2019-12-24 LAB — HEMOGLOBIN A1C
Hgb A1c MFr Bld: 10 % — ABNORMAL HIGH (ref 4.8–5.6)
Mean Plasma Glucose: 240.3 mg/dL

## 2019-12-24 MED ORDER — WARFARIN SODIUM 7.5 MG PO TABS: 7.5000 mg | ORAL_TABLET | Freq: Once | ORAL | Status: DC

## 2019-12-24 NOTE — Consult Note (Signed)
[place]  [phone]   HEMATOLOGY ONCOLOGY INPATIENT CONSULTATION   Dennis [name].  DOB: 08-23-1961  MR#: [id number]  CSN#: [id number]    Requesting Physician: Dr. Sandi Carne, Family Medicine   Patient Care Team: System, Pcp Not In as PCP - General  Reason for consult:  Probably metastatic lung cancer to brain   History of present illness:  Dennis [name] is a 59 yo male with PMH of type 2 diabetes, CHF with AICD, 40-year history of heavy smoking, presented with left-sided weakness and headaches.  His imaging is highly concerning for lung cancer with brain metastasis.  I was called for consult.   He first noticed left-sided weakness in arm and leg about 5 days ago, he is still able to go to work, he also noticed intermittent low back pain, and headaches.  No vision or hearing change.  He has chronic cough, which has been stable.  Energy level and appetite has been normal lately, no significant weight loss.  CT head in the ER showed 1.4 x 1.5 x 1.5 cm enhancing mass lesion in the posterior right frontal lobe, with vasogenic edema.  CT chest abdomen pelvis showed a speculated 1.5 cm right upper middle lobe lung mass, mildly enlarged mediastinal lymph node, no other definitive metastasis on CT scan.  Patient was started on dexamethasone 6 mg twice daily, his headaches has resolved.  His left-sided weakness is stable.  He lives alone in [place], but works in [place].  He has a 17 year old daughter who lives with her mother.  His mother and brother also live in [place]:  Past Medical History:  Diagnosis Date  . Alcohol abuse with alcohol-induced mood disorder ([place]) 08/18/2015  . Automatic implantable cardioverter-defibrillator in situ 2012   S/P St Jude Dual Chamber. ICD.  Marland Kitchen CAD (coronary artery disease) 11/16/2014   CAD Coronary angiography (12/2008) with mid LAD totally occluded and collateralized.  . Cardiac LV ejection fraction 10-20%     . Chest pain 09/04/2018  . CHF (congestive heart failure) ([place])   . Chronic systolic heart failure (HCC)    a) Mixed ICM/NICM b) RHC (05/2014): RA 2, RV 19/2/3, PA 22/14 (18), PCWP 6, Fick CO/CI: 5.2 / 2.7, PVR 2.3 WU, PA 60% and 64% c) ECHO (05/2014): EF 20-25%, diff HK, akinesis entireanteroseptal myocardium, triv AI, mod MR, LA mod/sev dilated  . Cocaine abuse ([place]) 01/24/2015  . Cocaine abuse with cocaine-induced mood disorder ([place]) 08/20/2015  . Drug abuse and dependence ([place])   . Dysphagia following cerebral infarction 01/24/2015  . Embolic stroke involving right middle cerebral artery ([place])   . H/O noncompliance with medical treatment, presenting hazards to health 01/24/2015  . Heart murmur   . High cholesterol   . Ischemic cardiomyopathy    a) Coronary angiography (12/2008) at [facility]: [place]: nl, LAD mid 100% stenosis with left to left and right-to-left collaterals to the distal LAD; L[place]: nl, RCA nl.    . 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[place])   . MDD (major depressive disorder), recurrent severe, without psychosis ([place]) 08/18/2015  . Open wound of foot 02/27/2015  . Pneumonia 1988  . Psychoactive substance-induced mood disorder ([place]) 07/17/2015  . Sleep apnea    "cleared after T&A"  . Status post PICC central line placement   . Stroke ([place])   . Substance induced mood disorder ([place]) 08/17/2015  . Type II diabetes mellitus ([place])     SURGICAL HISTORY: Past Surgical History:  Procedure Laterality Date  .  CARDIAC CATHETERIZATION  05/2014  . CARDIAC DEFIBRILLATOR PLACEMENT  03/2011  . CHOLECYSTECTOMY  1994  . FOREARM FRACTURE SURGERY Left 1980  . FRACTURE SURGERY    . RIGHT HEART CATHETERIZATION N/A 05/30/2014   Procedure: RIGHT HEART CATH;  Surgeon: Jolaine Artist, MD;  Location: [facility] CATH LAB;  Service: Cardiovascular;  Laterality: N/A;  . RIGHT HEART CATHETERIZATION N/A 02/07/2015   Procedure: RIGHT HEART CATH;  Surgeon: Jolaine Artist, MD;  Location: [facility] CATH LAB;  Service:  Cardiovascular;  Laterality: N/A;  . TONSILLECTOMY AND ADENOIDECTOMY  ~ 1998    SOCIAL HISTORY: Social History   Socioeconomic History  . Marital status: Single    Spouse name: Not on file  . Number of children: 1  . Years of education: 35  . Highest education level: Not on file  Occupational History  . Occupation: Disabled  Tobacco Use  . Smoking status: Current Some Day Smoker    Packs/day: 0.25    Years: 31.00    Pack years: 7.75    Types: Cigarettes  . Smokeless tobacco: Never Used  Substance and Sexual Activity  . Alcohol use: Yes    Alcohol/week: 0.0 standard drinks    Comment: 09/21/2014 "might have a drink q 6 /months, if that"  . Drug use: Yes    Types: Cocaine    Comment: 09/21/2014 "last cocaine ~ 1 1/2 months ago"  . Sexual activity: Yes  Other Topics Concern  . Not on file  Social History Narrative   Lives in [place] with his mother. No pets. Fun: movies, sports, daughter.    Denies religious beliefs that would effect health care.    Social Determinants of Health   Financial Resource Strain:   . Difficulty of Paying Living Expenses:   Food Insecurity:   . Worried About Charity fundraiser in the Last Year:   . Arboriculturist in the Last Year:   Transportation Needs:   . Film/video editor (Medical):   Marland Kitchen Lack of Transportation (Non-Medical):   Physical Activity:   . Days of Exercise per Week:   . Minutes of Exercise per Session:   Stress:   . Feeling of Stress :   Social Connections:   . Frequency of Communication with Friends and Family:   . Frequency of Social Gatherings with Friends and Family:   . Attends Religious Services:   . Active Member of Clubs or Organizations:   . Attends Archivist Meetings:   Marland Kitchen Marital Status:   Intimate Partner Violence:   . Fear of Current or Ex-Partner:   . Emotionally Abused:   Marland Kitchen Physically Abused:   . Sexually Abused:     FAMILY HISTORY: Family History  Problem Relation Age of Onset    . Diabetes Mother   . Heart disease Mother   . Diabetes Father   . Prostate cancer Father   . Heart disease Father   . Diabetes Brother   . Diabetes Brother     ALLERGIES:  has No Known Allergies.  MEDICATIONS:  Current Facility-Administered Medications  Medication Dose Route Frequency Provider Last Rate Last Admin  . atorvastatin (LIPITOR) tablet 20 mg  20 mg Oral q1800 Anderson, Chelsey L, DO   20 mg at 12/23/19 1722  . canagliflozin [facility]) tablet 100 mg  100 mg Oral QAC breakfast Ouida Sills, Chelsey L, DO   100 mg at 12/24/19 0640  . carvedilol (COREG) tablet 9.375 mg  9.375 mg Oral BID WC Mahoney,  Urban Gibson, MD   9.375 mg at 12/24/19 0859  . dexamethasone (DECADRON) tablet 6 mg  6 mg Oral BID PC McDiarmid, Blane Ohara, MD   6 mg at 12/24/19 0859  . insulin aspart (novoLOG) injection 0-9 Units  0-9 Units Subcutaneous TID WC Anderson, Chelsey L, DO   3 Units at 12/24/19 1248  . insulin glargine (LANTUS) injection 20 Units  20 Units Subcutaneous Q2200 Doristine Mango L, DO   20 Units at 12/23/19 2214    REVIEW OF SYSTEMS:   Constitutional: Denies fevers, chills or abnormal night sweats Eyes: Denies blurriness of vision, double vision or watery eyes Ears, nose, mouth, throat, and face: Denies mucositis or sore throat Respiratory: (+) chronic dry cough, no wheezing or dyspnea Cardiovascular: Denies palpitation, chest discomfort or lower extremity swelling Gastrointestinal:  Denies nausea, heartburn or change in bowel habits Skin: Denies abnormal skin rashes Lymphatics: Denies new lymphadenopathy or easy bruising Neurological: see HPI Behavioral/Psych: Mood is stable, no new changes  All other systems were reviewed with the patient and are negative.  PHYSICAL EXAMINATION: ECOG PERFORMANCE STATUS: 1 - Symptomatic but completely ambulatory  Vitals:   12/24/19 1207 12/24/19 1532  BP: 113/80 113/75  Pulse: 90 89  Resp: 16 18  Temp: 97.8 F (36.6 C) 98.2 F (36.8 C)  SpO2: 99%  98%   Filed Weights   12/23/19 1037  Weight: 170 lb (77.1 kg)    GENERAL:alert, no distress and comfortable SKIN: skin color, texture, turgor are normal, no rashes or significant lesions EYES: normal, conjunctiva are pink and non-injected, sclera clear OROPHARYNX:no exudate, no erythema and lips, buccal mucosa, and tongue normal  NECK: supple, thyroid normal size, non-tender, without nodularity LYMPH:  no palpable lymphadenopathy in the cervical, axillary or inguinal LUNGS: clear to auscultation and percussion with normal breathing effort HEART: regular rate & rhythm and no murmurs and no lower extremity edema ABDOMEN:abdomen soft, non-tender and normal bowel sounds Musculoskeletal:no cyanosis of digits and no clubbing  PSYCH: alert & oriented x 3 with fluent speech NEURO: no focal motor/sensory deficits except left arm and leg mild weakness (strength 4/5)   LABORATORY DATA:  I have reviewed the data as listed Lab Results  Component Value Date   WBC 10.9 (H) 12/24/2019   HGB 12.7 (L) 12/24/2019   HCT 40.5 12/24/2019   MCV 94.6 12/24/2019   PLT 288 12/24/2019   Recent Labs    10/07/19 0844 10/07/19 0844 12/23/19 1056 12/23/19 1132 12/24/19 0411  NA 140   < > 139 139 139  K 4.1   < > 4.6 4.6 4.0  CL 106   < > 107 108 103  CO2 24  --  21*  --  23  GLUCOSE 233*   < > 135* 132* 149*  BUN 12   < > 25* 27* 28*  CREATININE 1.28*   < > 1.64* 1.70* 1.60*  CALCIUM 9.0  --  9.1  --  8.8*  GFRNONAA >60  --  46*  --  47*  GFRAA >60  --  53*  --  55*  PROT  --   --  6.7  --   --   ALBUMIN  --   --  3.3*  --   --   AST  --   --  18  --   --   ALT  --   --  13  --   --   ALKPHOS  --   --  96  --   --  BILITOT  --   --  1.0  --   --    < > = values in this interval not displayed.    RADIOGRAPHIC STUDIES: I have personally reviewed the radiological images as listed and agreed with the findings in the report. CT HEAD WO CONTRAST  Result Date: 12/23/2019 CLINICAL DATA:  Left  arm weakness since Monday or Tuesday. Fall on Wednesday night. EXAM: CT HEAD WITHOUT CONTRAST TECHNIQUE: Contiguous axial images were obtained from the base of the skull through the vertex without intravenous contrast. COMPARISON:  01/24/2015 FINDINGS: Brain: 14 mm dense intra-axial mass with moderate vasogenic edema in the high right frontal lobe. Remote infarct in the right frontal operculum and insula. No acute hemorrhage or hydrocephalus. No acute infarct. Vascular: No hyperdense vessel. Skull: No evidence of fracture or bone lesion Sinuses/Orbits: Negative IMPRESSION: 1. 14 mm right frontal mass with vasogenic edema. Recommend brain MRI with contrast. 2. Chronic right insular and opercular infarct. Electronically Signed   By: Monte Fantasia M.D.   On: 12/23/2019 11:16   CT HEAD W CONTRAST  Result Date: 12/23/2019 CLINICAL DATA:  Cerebral mass. EXAM: CT HEAD WITH CONTRAST TECHNIQUE: Contiguous axial images were obtained from the base of the skull through the vertex with intravenous contrast. CONTRAST:  169mL OMNIPAQUE IOHEXOL 300 MG/ML  SOLN COMPARISON:  CT head without contrast 12/23/2019 FINDINGS: Brain: Postcontrast images confirm a homogeneously enhancing mass lesion in the posterior right frontal lobe measuring 1.4 x 1.5 x 1.5 cm. Surrounding vasogenic edema is present with partial effacement of sulci. No significant midline shift is present. No other foci of enhancement are present. Remote infarcts involving the right insular cortex as well as the anterior frontal lobe and precentral gyrus are again noted. The brainstem and cerebellum are within normal limits. The left hemisphere is unremarkable. Vascular: Contrast is present in the major vascular structures. Skull: Calvarium is intact. No focal lytic or blastic lesions are present. No significant extracranial soft tissue lesion is present. Sinuses/Orbits: The paranasal sinuses and mastoid air cells are clear. The globes and orbits are within normal  limits. IMPRESSION: 1. 1.4 x 1.5 x 1.5 cm homogeneously enhancing mass lesion in the posterior right frontal lobe with surrounding vasogenic edema and partial effacement of sulci. This most likely represents a solitary metastasis. Primary brain tumor is not excluded. 2. No significant midline shift. 3. Remote infarcts involving the right insular cortex and anterior frontal lobe and precentral gyrus. Electronically Signed   By: San Morelle M.D.   On: 12/23/2019 19:12   CT CHEST W CONTRAST  Result Date: 12/23/2019 CLINICAL DATA:  Inpatient. Right frontal lobe cerebral mass with vasogenic edema on head CT. Assess for primary neoplasm. EXAM: CT CHEST, ABDOMEN, AND PELVIS WITH CONTRAST TECHNIQUE: Multidetector CT imaging of the chest, abdomen and pelvis was performed following the standard protocol during bolus administration of intravenous contrast. CONTRAST:  125mL OMNIPAQUE IOHEXOL 300 MG/ML  SOLN COMPARISON:  06/25/2011 chest CT angiogram and 06/26/2011 CT abdomen. FINDINGS: CT CHEST FINDINGS Cardiovascular: Normal heart size. No significant pericardial effusion/thickening. Two lead left subclavian ICD is noted with lead tips in the right atrium and right ventricular apex. Left anterior descending coronary atherosclerosis. Atherosclerotic nonaneurysmal thoracic aorta. Normal caliber pulmonary arteries. No central pulmonary emboli. Mediastinum/Nodes: No discrete thyroid nodules. Unremarkable esophagus. No axillary adenopathy. Mildly enlarged 1.1 cm aortopulmonary node (series 3/image 23), new since 2012. No additional pathologically enlarged mediastinal nodes. No pathologically enlarged hilar nodes. Lungs/Pleura: No pneumothorax. No pleural effusion. Moderate  centrilobular and paraseptal emphysema. No acute consolidative airspace disease or lung masses. Irregular spiculated posterior right middle lobe 1.5 x 0.7 cm pulmonary nodule abutting and distorting the right major fissure (series 5/image 97), which  may represent growth of a 0.5 cm nodule seen in this location on 06/24/2011 chest CT. No additional significant pulmonary nodules. Parenchymal banding at the dependent left lung base compatible with nonspecific postinfectious/postinflammatory scarring. Musculoskeletal: No aggressive appearing focal osseous lesions. Moderate symmetric bilateral gynecomastia is unchanged. CT ABDOMEN PELVIS FINDINGS Hepatobiliary: Normal liver with no liver mass. Cholecystectomy. No biliary ductal dilatation. Pancreas: Normal, with no mass or duct dilation. Spleen: Normal size. No mass. Adrenals/Urinary Tract: Right adrenal 3.2 cm nodule with density 45 HU minimally increased from 2.8 cm on 06/24/2011 CT. No left adrenal nodules. No hydronephrosis. Subcentimeter hypodense renal cortical lesion in the upper right kidney is too small to characterize and requires no follow-up. Scattered mild renal cortical scarring in the kidneys bilaterally. Normal bladder. Stomach/Bowel: Normal non-distended stomach. Normal caliber small bowel with no small bowel wall thickening. Appendix not discretely visualized. Normal large bowel with no diverticulosis, large bowel wall thickening or pericolonic fat stranding. Vascular/Lymphatic: Atherosclerotic nonaneurysmal abdominal aorta. Patent portal, splenic, hepatic and renal veins. No pathologically enlarged lymph nodes in the abdomen or pelvis. Reproductive: Top-normal size prostate. Other: No pneumoperitoneum, ascites or focal fluid collection. Musculoskeletal: No aggressive appearing focal osseous lesions. Healed deformity in the lateral left iliac wing. Moderate degenerative disc disease at L5-S1 with unilateral right L5 pars defect. IMPRESSION: 1. Spiculated 1.5 x 0.7 cm posterior right middle lobe pulmonary nodule abutting and distorting the right major fissure, suspicious for primary bronchogenic carcinoma. 2. Solitary mildly enlarged aortopulmonary mediastinal lymph node. Metastatic disease not  excluded. 3. Indeterminate right adrenal 3.2 cm nodule, mildly increased since 2012 CT, more likely a slow growing adenoma. 4. Suggest short-term outpatient PET-CT for further staging evaluation. 5. Aortic Atherosclerosis (ICD10-I70.0) and Emphysema (ICD10-J43.9). Electronically Signed   By: Ilona Sorrel M.D.   On: 12/23/2019 18:57   CT ABDOMEN PELVIS W CONTRAST  Result Date: 12/23/2019 CLINICAL DATA:  Inpatient. Right frontal lobe cerebral mass with vasogenic edema on head CT. Assess for primary neoplasm. EXAM: CT CHEST, ABDOMEN, AND PELVIS WITH CONTRAST TECHNIQUE: Multidetector CT imaging of the chest, abdomen and pelvis was performed following the standard protocol during bolus administration of intravenous contrast. CONTRAST:  157mL OMNIPAQUE IOHEXOL 300 MG/ML  SOLN COMPARISON:  06/25/2011 chest CT angiogram and 06/26/2011 CT abdomen. FINDINGS: CT CHEST FINDINGS Cardiovascular: Normal heart size. No significant pericardial effusion/thickening. Two lead left subclavian ICD is noted with lead tips in the right atrium and right ventricular apex. Left anterior descending coronary atherosclerosis. Atherosclerotic nonaneurysmal thoracic aorta. Normal caliber pulmonary arteries. No central pulmonary emboli. Mediastinum/Nodes: No discrete thyroid nodules. Unremarkable esophagus. No axillary adenopathy. Mildly enlarged 1.1 cm aortopulmonary node (series 3/image 23), new since 2012. No additional pathologically enlarged mediastinal nodes. No pathologically enlarged hilar nodes. Lungs/Pleura: No pneumothorax. No pleural effusion. Moderate centrilobular and paraseptal emphysema. No acute consolidative airspace disease or lung masses. Irregular spiculated posterior right middle lobe 1.5 x 0.7 cm pulmonary nodule abutting and distorting the right major fissure (series 5/image 97), which may represent growth of a 0.5 cm nodule seen in this location on 06/24/2011 chest CT. No additional significant pulmonary nodules.  Parenchymal banding at the dependent left lung base compatible with nonspecific postinfectious/postinflammatory scarring. Musculoskeletal: No aggressive appearing focal osseous lesions. Moderate symmetric bilateral gynecomastia is unchanged. CT ABDOMEN PELVIS FINDINGS Hepatobiliary:  Normal liver with no liver mass. Cholecystectomy. No biliary ductal dilatation. Pancreas: Normal, with no mass or duct dilation. Spleen: Normal size. No mass. Adrenals/Urinary Tract: Right adrenal 3.2 cm nodule with density 45 HU minimally increased from 2.8 cm on 06/24/2011 CT. No left adrenal nodules. No hydronephrosis. Subcentimeter hypodense renal cortical lesion in the upper right kidney is too small to characterize and requires no follow-up. Scattered mild renal cortical scarring in the kidneys bilaterally. Normal bladder. Stomach/Bowel: Normal non-distended stomach. Normal caliber small bowel with no small bowel wall thickening. Appendix not discretely visualized. Normal large bowel with no diverticulosis, large bowel wall thickening or pericolonic fat stranding. Vascular/Lymphatic: Atherosclerotic nonaneurysmal abdominal aorta. Patent portal, splenic, hepatic and renal veins. No pathologically enlarged lymph nodes in the abdomen or pelvis. Reproductive: Top-normal size prostate. Other: No pneumoperitoneum, ascites or focal fluid collection. Musculoskeletal: No aggressive appearing focal osseous lesions. Healed deformity in the lateral left iliac wing. Moderate degenerative disc disease at L5-S1 with unilateral right L5 pars defect. IMPRESSION: 1. Spiculated 1.5 x 0.7 cm posterior right middle lobe pulmonary nodule abutting and distorting the right major fissure, suspicious for primary bronchogenic carcinoma. 2. Solitary mildly enlarged aortopulmonary mediastinal lymph node. Metastatic disease not excluded. 3. Indeterminate right adrenal 3.2 cm nodule, mildly increased since 2012 CT, more likely a slow growing adenoma. 4. Suggest  short-term outpatient PET-CT for further staging evaluation. 5. Aortic Atherosclerosis (ICD10-I70.0) and Emphysema (ICD10-J43.9). Electronically Signed   By: Ilona Sorrel M.D.   On: 12/23/2019 18:57   CUP PACEART REMOTE DEVICE CHECK  Result Date: 12/20/2019 Scheduled remote reviewed.  Normal device function. Battery longevity 9.29mths. AF burden <1%, EGM's show brief noise as seen previously. OAC- Warfarin Next remote 91 days- JBox, RN/CVRS   ASSESSMENT & PLAN: 59 year old gentleman with heavy smoking history, type 2 diabetes, poorly controlled, cardiomyopathy with EF 20-25%, status post AICD placement, on coumadin, history of embolic stroke, presented with left side weakness and headache   1. Probable right lung cancer with oligo brain metastasis  2.  Type 2 diabetes 3.  Chronic systolic heart failure, mixed ischemic and non-ischemic, EF 20-25% echo in 10/2018, s/p pacemaker placement, on coumadin  4.  History of embolic CVA 5.  Polysubstance abuse (smoking and cocaine)  6. CKD stage III   Recommendations: -I have reviewed his CT scan images myself, and agree with radiologist interpretation.  This is highly suspicious for right lung cancer with oligo brain metastasis. -I agree with steroids, he is on dexamethasone 6 mg every 12 hours, which is adequate.  Due to his poorly controlled diabetes, he is likely going to need higher dose Lantus, please adjust as needed. -I do not see any other metastasis on CT scan which is suitable for percutaneous biopsy, I will request pulmonary team to see him and arrange his bronchoscopy biopsy as soon as possible.  He needs to stop Coumadin, or reverse Coumadin if they can biopsy in the next few days.  Due to his previous embolic stroke history, he should not be off anticoagulation too long.  Unfortunately due to his stage III CKD, we need to be careful about lovenox or Eliquis if bridging anticoagulation is needed, will need cardiology input. I will send a message  to Dr. Haroldine Laws  -I discussed with the presumed diagnosis with patient, and incurable nature of the disease.  I discussed the treatment options, he would need brain radiation first. Unfortunately he is not able to undergo MRI for more detailed brian imaging. I will contact  rad/onc Dr. Lisbeth Renshaw and set up his appointment.  -I will f/u after his biopsy, and see him back in my office soon.    All questions were answered. The patient knows to call the clinic with any problems, questions or concerns.      Truitt Merle, MD 12/24/2019 5:10 PM

## 2019-12-24 NOTE — Progress Notes (Signed)
ANTICOAGULATION CONSULT NOTE - Follow-Up Consult  Pharmacy Consult for warfarin > heparin  Indication: Left ventricular noncompaction  No Known Allergies  Patient Measurements: Height: 6' (182.9 cm) Weight: 170 lb (77.1 kg) IBW/kg (Calculated) : 77.6  Vital Signs: Temp: 98.2 F (36.8 C) (03/13 1942) Temp Source: Oral (03/13 1942) BP: 114/76 (03/13 1942) Pulse Rate: 95 (03/13 1942)  Labs: Recent Labs    12/23/19 0857 12/23/19 1056 12/23/19 1056 12/23/19 1132 12/24/19 0411  HGB  --  13.1   < > 14.3 12.7*  HCT  --  42.8  --  42.0 40.5  PLT  --  280  --   --  288  APTT  --  45*  --   --   --   LABPROT  --  25.9*  --   --  28.5*  INR 3.1* 2.4*  --   --  2.7*  CREATININE  --  1.64*  --  1.70* 1.60*   < > = values in this interval not displayed.    Estimated Creatinine Clearance: 55.5 mL/min (A) (by C-G formula based on SCr of 1.6 mg/dL (H)).   Medical History: Past Medical History:  Diagnosis Date  . Alcohol abuse with alcohol-induced mood disorder ([place]) 08/18/2015  . Automatic implantable cardioverter-defibrillator in situ 2012   S/P [employer] Dual Chamber. ICD.  Marland Kitchen CAD (coronary artery disease) 11/16/2014   CAD Coronary angiography (12/2008) with mid LAD totally occluded and collateralized.  . Cardiac LV ejection fraction 10-20%   . Chest pain 09/04/2018  . CHF (congestive heart failure) ([place])   . Chronic systolic heart failure (HCC)    a) Mixed ICM/NICM b) RHC (05/2014): RA 2, RV 19/2/3, PA 22/14 (18), PCWP 6, Fick CO/CI: 5.2 / 2.7, PVR 2.3 WU, PA 60% and 64% c) ECHO (05/2014): EF 20-25%, diff HK, akinesis entireanteroseptal myocardium, triv AI, mod MR, LA mod/sev dilated  . Cocaine abuse ([place]) 01/24/2015  . Cocaine abuse with cocaine-induced mood disorder ([place]) 08/20/2015  . Drug abuse and dependence ([place])   . Dysphagia following cerebral infarction 01/24/2015  . Embolic stroke involving right middle cerebral artery ([place])   . H/O noncompliance with medical treatment,  presenting hazards to health 01/24/2015  . Heart murmur   . High cholesterol   . Ischemic cardiomyopathy    a) Coronary angiography (12/2008) at [facility]: Lmain: nl, LAD mid 100% stenosis with left to left and right-to-left collaterals to the distal LAD; Lcx: nl, RCA nl.    . 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[place])   . MDD (major depressive disorder), recurrent severe, without psychosis ([place]) 08/18/2015  . Open wound of foot 02/27/2015  . Pneumonia 1988  . Psychoactive substance-induced mood disorder ([place]) 07/17/2015  . Sleep apnea    "cleared after T&A"  . Status post PICC central line placement   . Stroke ([place])   . Substance induced mood disorder ([place]) 08/17/2015  . Type II diabetes mellitus (HCC)    Assessment: 59 YO M presenting with L sided weakness.  CT head showed new cerebral mass, concern for malignancy.  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  INR 2.7 remains therapeutic on home regimen.  CBC wnl.    PM plan: need for brinch and INR to be < 1.5  Hold warfarin and when INR < 2 begin heparin drip low dose (goal 0.3-0.5) to continue anticoagulation but decrease bleed risk of higher HL.    PTA dosing: 3.75mg  MWF, 7.5mg  all other  days  Goal of Therapy:  INR 2-3 Monitor platelets by anticoagulation protocol: Yes   Plan:  Hold warfarin Begin heparin when INR < 2  Bonnita Nasuti Pharm.D. CPP, BCPS Clinical Pharmacist [phone] 12/24/2019 8:05 PM     **Pharmacist phone directory can be found on [email or web address] listed under [place].  12/24/2019 8:03 PM

## 2019-12-24 NOTE — Progress Notes (Signed)
FPTS Interim Progress Note  Spoke with Merlene Laughter, NP, on-call for PCCM.  Advised of request for possible inpatient vs outpatient bronchoscopy for this patient at request of Dr. Burr Medico.  She reported she would advise Dr. Erskine Emery of the request and they would be in contact with [place] about scheduling this inpatient, versus urgently as outpatient (within 1 week).  [place], DO 12/24/2019, 3:11 PM PGY-2, [place] [facility] pager [phone]

## 2019-12-24 NOTE — Consult Note (Addendum)
Cardiology Consultation:   Patient ID: Dennis [name]. MRN: [id number]; DOB: 1961-09-16  Admit date: 12/23/2019 Date of Consult: 12/25/2019  Primary Care Provider: System, Pcp Not In Primary Cardiologist: [place] Primary Electrophysiologist: Lovena Le   Patient Profile:   Dennis Decoste. is a 59 y.o. 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 He is admitted with acute neurologic changes with workup notable for intracranial mass. Cardiology consultation has been requested by Dr. McDiarmid for preoperative evaluation.  History of Present Illness:   Dennis Garmon Dehn. is a 59 y.o. 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 He is admitted with acute neurologic changes with workup notable for intracranial mass. Cardiology consultation has been requested by Dr. McDiarmid for preoperative evaluation.  The patient has a complex cardiac history and follows in HF clinic with Dr. Haroldine Laws. He was last seen in 07/2019 at which time he was doing well with stable NHYA Class II-III HF symptoms. His last echocardiogram showed stably reduced LVEF of 20-25%.  He was hospitalized yesterday with several days of left sided weakness and slurred speech. Workup was notable for 15 mm R frontal mas with vasogenic edema. Subsequent chest CT showed suspected metastatic disease in the R lung and lymph node. Bronchoscopy and biopsy of the lung nodule for diagnosis was recommended. Given the patient's cardiac history, preoperative cardiac assessment was requested.  On my evaluation, the patient is resting in bed. He states that his left sided weakness has been improving since admission. He states that he has been stable from a cardiac perspective. He  denies any chest pain and states that his dyspnea is at his baseline. He reports that he can climb a flight of stairs without stopping, although he feels slightly winded when he reaches the top. He states that he does not weigh himself daily but has not required a diuretic. He denies any recent changes in his medications.    Past Medical History:  Diagnosis Date  . Alcohol abuse with alcohol-induced mood disorder ([place]) 08/18/2015  . Automatic implantable cardioverter-defibrillator in situ 2012   S/P St Jude Dual Chamber. ICD.  Marland Kitchen CAD (coronary artery disease) 11/16/2014   CAD Coronary angiography (12/2008) with mid LAD totally occluded and collateralized.  . Cardiac LV ejection fraction 10-20%   . Chest pain 09/04/2018  . CHF (congestive heart failure) ([place])   . Chronic systolic heart failure (HCC)    a) Mixed ICM/NICM b) RHC (05/2014): RA 2, RV 19/2/3, PA 22/14 (18), PCWP 6, Fick CO/CI: 5.2 / 2.7, PVR 2.3 WU, PA 60% and 64% c) ECHO (05/2014): EF 20-25%, diff HK, akinesis entireanteroseptal myocardium, triv AI, mod MR, LA mod/sev dilated  . Cocaine abuse ([place]) 01/24/2015  . Cocaine abuse with cocaine-induced mood disorder ([place]) 08/20/2015  . Drug abuse and dependence ([place])   . Dysphagia following cerebral infarction 01/24/2015  . Embolic stroke involving right middle cerebral artery ([place])   . H/O noncompliance with medical treatment, presenting hazards to health 01/24/2015  . Heart murmur   . High cholesterol   . Ischemic cardiomyopathy    a) Coronary angiography (12/2008) at [facility]: Lmain: nl, LAD mid 100% stenosis with left to left and right-to-left collaterals to the distal LAD; Lcx: nl, RCA nl.    . 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[place])   .  MDD (major depressive disorder), recurrent severe, without psychosis ([place]) 08/18/2015  . Open wound of foot 02/27/2015  . Pneumonia 1988  . Psychoactive substance-induced mood disorder ([place]) 07/17/2015  . Sleep apnea    "cleared after T&A"  . Status post  PICC central line placement   . Stroke ([place])   . Substance induced mood disorder ([place]) 08/17/2015  . Type II diabetes mellitus ([place])     Past Surgical History:  Procedure Laterality Date  . CARDIAC CATHETERIZATION  05/2014  . CARDIAC DEFIBRILLATOR PLACEMENT  03/2011  . CHOLECYSTECTOMY  1994  . FOREARM FRACTURE SURGERY Left 1980  . FRACTURE SURGERY    . RIGHT HEART CATHETERIZATION N/A 05/30/2014   Procedure: RIGHT HEART CATH;  Surgeon: Jolaine Artist, MD;  Location: [facility] CATH LAB;  Service: Cardiovascular;  Laterality: N/A;  . RIGHT HEART CATHETERIZATION N/A 02/07/2015   Procedure: RIGHT HEART CATH;  Surgeon: Jolaine Artist, MD;  Location: [facility] CATH LAB;  Service: Cardiovascular;  Laterality: N/A;  . TONSILLECTOMY AND ADENOIDECTOMY  ~ 1998     Home Medications:  Prior to Admission medications   Medication Sig Start Date End Date Taking? Authorizing Provider  atorvastatin (LIPITOR) 20 MG tablet TAKE 1 TABLET BY MOUTH DAILY AT 6 PM. Patient taking differently: Take 20 mg by mouth daily at 6 PM.  03/11/17  Yes Bensimhon, Shaune Pascal, MD  carvedilol (COREG) 6.25 MG tablet Take 1.5 tablets (9.375 mg total) by mouth 2 (two) times daily with a meal. 08/05/19  Yes Bensimhon, Shaune Pascal, MD  empagliflozin (JARDIANCE) 10 MG TABS tablet Take 10 mg by mouth daily before breakfast. 08/05/19  Yes Bensimhon, Shaune Pascal, MD  ENTRESTO 97-103 MG TAKE 1 TABLET BY MOUTH TWO TIMES DAILY. Patient taking differently: Take 1 tablet by mouth 2 (two) times daily. May crush. May be given with or without food. Observe for signs and symptoms of angioedema or hypotension. Entresto (sacubitril/valsartan) should NOT be administered within 36-hours of ACE-inhibitor administration. 10/06/19  Yes Bensimhon, Shaune Pascal, MD  Insulin Glargine (LANTUS SOLOSTAR) 100 UNIT/ML Solostar Pen Inject 32 Units into the skin daily at 10 pm.    Yes [provider]  metFORMIN (GLUCOPHAGE) 500 MG tablet Take 500 mg by mouth 2 (two) times  daily with a meal.   Yes [provider]  spironolactone (ALDACTONE) 25 MG tablet Take 1 tablet (25 mg total) by mouth daily. 12/15/18  Yes Shirley Friar, PA-C  warfarin (COUMADIN) 7.5 MG tablet Take 1/2 or 1 tablet daily as directed by Coumadin clinic Patient taking differently: Take 3.75-7.5 mg by mouth See admin instructions. Take 1/2  (3.75) mg on Monday , Wed, Friday and 1 tablet 7.5mg  all other days. directed by Coumadin clinic 12/24/18  Yes Bensimhon, Shaune Pascal, MD  tadalafil (CIALIS) 20 MG tablet Take 1 tablet (20 mg total) by mouth daily as needed for erectile dysfunction. Patient not taking: Reported on 12/23/2019 08/26/16   Shirley Friar, PA-C    Inpatient Medications: Scheduled Meds: . atorvastatin  20 mg Oral q1800  . canagliflozin  100 mg Oral QAC breakfast  . carvedilol  9.375 mg Oral BID WC  . dexamethasone  6 mg Oral BID PC  . insulin aspart  0-9 Units Subcutaneous TID WC  . insulin glargine  20 Units Subcutaneous Q2200   Continuous Infusions:  PRN Meds:   Allergies:   No Known Allergies  Social History:   Social History   Socioeconomic History  . Marital status: Single  Spouse name: Not on file  . Number of children: 1  . Years of education: 49  . Highest education level: Not on file  Occupational History  . Occupation: Disabled  Tobacco Use  . Smoking status: Current Some Day Smoker    Packs/day: 0.25    Years: 31.00    Pack years: 7.75    Types: Cigarettes  . Smokeless tobacco: Never Used  Substance and Sexual Activity  . Alcohol use: Yes    Alcohol/week: 0.0 standard drinks    Comment: 09/21/2014 "might have a drink q 6 /months, if that"  . Drug use: Yes    Types: Cocaine    Comment: 09/21/2014 "last cocaine ~ 1 1/2 months ago"  . Sexual activity: Yes  Other Topics Concern  . Not on file  Social History Narrative   Lives in [place] with his mother. No pets. Fun: movies, sports, daughter.    Denies religious  beliefs that would effect health care.    Social Determinants of Health   Financial Resource Strain:   . Difficulty of Paying Living Expenses:   Food Insecurity:   . Worried About Charity fundraiser in the Last Year:   . Arboriculturist in the Last Year:   Transportation Needs:   . Film/video editor (Medical):   Marland Kitchen Lack of Transportation (Non-Medical):   Physical Activity:   . Days of Exercise per Week:   . Minutes of Exercise per Session:   Stress:   . Feeling of Stress :   Social Connections:   . Frequency of Communication with Friends and Family:   . Frequency of Social Gatherings with Friends and Family:   . Attends Religious Services:   . Active Member of Clubs or Organizations:   . Attends Archivist Meetings:   Marland Kitchen Marital Status:   Intimate Partner Violence:   . Fear of Current or Ex-Partner:   . Emotionally Abused:   Marland Kitchen Physically Abused:   . Sexually Abused:     Family History:    Family History  Problem Relation Age of Onset  . Diabetes Mother   . Heart disease Mother   . Diabetes Father   . Prostate cancer Father   . Heart disease Father   . Diabetes Brother   . Diabetes Brother      ROS:  Please see the history of present illness.  All other ROS reviewed and negative.     Physical Exam/Data:   Vitals:   12/24/19 1532 12/24/19 1942 12/24/19 2337 12/25/19 0420  BP: 113/75 114/76 119/80 122/73  Pulse: 89 95 88 80  Resp: 18 18 18 18   Temp: 98.2 F (36.8 C) 98.2 F (36.8 C) 98.9 F (37.2 C) 98 F (36.7 C)  TempSrc: Oral Oral Oral Oral  SpO2: 98% 97% 96% 98%  Weight:      Height:        Intake/Output Summary (Last 24 hours) at 12/25/2019 0605 Last data filed at 12/24/2019 1207 Gross per 24 hour  Intake 720 ml  Output 250 ml  Net 470 ml   Last 3 Weights 12/23/2019 10/07/2019 08/05/2019  Weight (lbs) 170 lb 170 lb 163 lb 6.4 oz  Weight (kg) 77.111 kg 77.111 kg 74.118 kg  Some encounter information is confidential and restricted.  Go to Review Flowsheets activity to see all data.     Body mass index is 23.06 kg/m.  General:  Well nourished, well developed, in no acute distress  HEENT:  normal Neck: JVD 2 cm above clavicle at 30 degrees Cardiac:  normal S1, S2; RRR; no murmur  Lungs:  clear to auscultation bilaterally, no wheezing, rhonchi or rales  Abd: soft, nontender  Ext: no edema Musculoskeletal:  No deformities  Skin: warm and dry  Neuro:  Moves all extremities, although with L arm weakness Psych:  Normal affect   EKG:  The EKG was personally reviewed and demonstrates:  Sinus rhythm, LVH, biatrial enlargement. Poor R wave progresison Telemetry:  Telemetry was personally reviewed and demonstrates:  Sinus rhythm with occasional PVCs  Relevant CV Studies: Echo 10/2018 LVEF 20-25%, Grade 2 DD.   Echo 2016 EF 15%.  Echo (9/17): EF 25-30%, moderate LV dilation with prominent trabeculation, normal RV size and systolic function.  Echo 08/12/17 EF 45-50%, much improved from previous  Per Dr Vaughan Browner  [place] 02/07/15 RA = 3 RV = 50/4/7 PA = 55/14 (34) PCW = 22 Fick cardiac output/index = 4.6/2.4 Thermodilution CO/CI = 4.8/2.5 PVR = 2.5 WU SVR = 1443 FA sat = 96% PA sat = 51%, 56%, 61%  Noninvasive Ao pressure = 106/74 (86)   CPX 02/07/2016 Peak VO2: 18.7 (54% predicted peak VO2) VE/VCO2 slope: 30 OUES: 1.62 Peak RER:  1.13 Ventilatory Threshold: 10.9 (32% predicted or measured peak VO2)  CPX 12/24/16 Peak VO2 19.6 (58% predicted peak VO2) VE/VCO2 slope: 31 OUES: 1.79 Peak RER 1.09 Ventilatory threshold: 14.9 (44% predicted or measure peak VO2)   CPX 3/20 FVC 3.33 (76%)    FEV1 2.54 (75%)     FEV1/FVC 76 (97%)     MVV 116 (76%)   Resting HR: 94 Peak HR: 122  (74% age predicted max HR)  BP rest: 94/64 BP peak: 114/62  Peak VO2: 19.9 (60% predicted peak VO2)  VE/VCO2 slope: 31  OUES: 2.07  Peak RER: 1.09  Ventilatory Threshold: 15.3 (46% predicted or 77% measured peak  VO2)  VE/MVV: 50%  O2pulse: 12  (80% predicted O2pulse)   Laboratory Data:  High Sensitivity Troponin:  No results for input(s): TROPONINIHS in the last 720 hours.   Chemistry Recent Labs  Lab 12/23/19 1056 12/23/19 1056 12/23/19 1132 12/24/19 0411 12/25/19 0421  NA 139   < > 139 139 138  K 4.6   < > 4.6 4.0 4.2  CL 107   < > 108 103 107  CO2 21*  --   --  23 24  GLUCOSE 135*   < > 132* 149* 169*  BUN 25*   < > 27* 28* 34*  CREATININE 1.64*   < > 1.70* 1.60* 1.39*  CALCIUM 9.1  --   --  8.8* 8.8*  GFRNONAA 46*  --   --  47* 56*  GFRAA 53*  --   --  55* >60  ANIONGAP 11  --   --  13 7   < > = values in this interval not displayed.    Recent Labs  Lab 12/23/19 1056  PROT 6.7  ALBUMIN 3.3*  AST 18  ALT 13  ALKPHOS 96  BILITOT 1.0   Hematology Recent Labs  Lab 12/23/19 1056 12/23/19 1056 12/23/19 1132 12/24/19 0411 12/25/19 0421  WBC 9.9  --   --  10.9* 14.3*  RBC 4.39  --   --  4.28 3.86*  HGB 13.1   < > 14.3 12.7* 11.5*  HCT 42.8   < > 42.0 40.5 36.0*  MCV 97.5  --   --  94.6 93.3  MCH 29.8  --   --  29.7 29.8  MCHC 30.6  --   --  31.4 31.9  RDW 13.5  --   --  13.4 13.6  PLT 280  --   --  288 277   < > = values in this interval not displayed.   BNPNo results for input(s): BNP, PROBNP in the last 168 hours.  DDimer No results for input(s): DDIMER in the last 168 hours.   Radiology/Studies:  CT HEAD WO CONTRAST  Result Date: 12/23/2019 CLINICAL DATA:  Left arm weakness since Monday or Tuesday. Fall on Wednesday night. EXAM: CT HEAD WITHOUT CONTRAST TECHNIQUE: Contiguous axial images were obtained from the base of the skull through the vertex without intravenous contrast. COMPARISON:  01/24/2015 FINDINGS: Brain: 14 mm dense intra-axial mass with moderate vasogenic edema in the high right frontal lobe. Remote infarct in the right frontal operculum and insula. No acute hemorrhage or hydrocephalus. No acute infarct. Vascular: No hyperdense vessel. Skull: No  evidence of fracture or bone lesion Sinuses/Orbits: Negative IMPRESSION: 1. 14 mm right frontal mass with vasogenic edema. Recommend brain MRI with contrast. 2. Chronic right insular and opercular infarct. Electronically Signed   By: Monte Fantasia M.D.   On: 12/23/2019 11:16   CT HEAD W CONTRAST  Result Date: 12/23/2019 CLINICAL DATA:  Cerebral mass. EXAM: CT HEAD WITH CONTRAST TECHNIQUE: Contiguous axial images were obtained from the base of the skull through the vertex with intravenous contrast. CONTRAST:  170mL OMNIPAQUE IOHEXOL 300 MG/ML  SOLN COMPARISON:  CT head without contrast 12/23/2019 FINDINGS: Brain: Postcontrast images confirm a homogeneously enhancing mass lesion in the posterior right frontal lobe measuring 1.4 x 1.5 x 1.5 cm. Surrounding vasogenic edema is present with partial effacement of sulci. No significant midline shift is present. No other foci of enhancement are present. Remote infarcts involving the right insular cortex as well as the anterior frontal lobe and precentral gyrus are again noted. The brainstem and cerebellum are within normal limits. The left hemisphere is unremarkable. Vascular: Contrast is present in the major vascular structures. Skull: Calvarium is intact. No focal lytic or blastic lesions are present. No significant extracranial soft tissue lesion is present. Sinuses/Orbits: The paranasal sinuses and mastoid air cells are clear. The globes and orbits are within normal limits. IMPRESSION: 1. 1.4 x 1.5 x 1.5 cm homogeneously enhancing mass lesion in the posterior right frontal lobe with surrounding vasogenic edema and partial effacement of sulci. This most likely represents a solitary metastasis. Primary brain tumor is not excluded. 2. No significant midline shift. 3. Remote infarcts involving the right insular cortex and anterior frontal lobe and precentral gyrus. Electronically Signed   By: San Morelle M.D.   On: 12/23/2019 19:12   CT CHEST W  CONTRAST  Result Date: 12/23/2019 CLINICAL DATA:  Inpatient. Right frontal lobe cerebral mass with vasogenic edema on head CT. Assess for primary neoplasm. EXAM: CT CHEST, ABDOMEN, AND PELVIS WITH CONTRAST TECHNIQUE: Multidetector CT imaging of the chest, abdomen and pelvis was performed following the standard protocol during bolus administration of intravenous contrast. CONTRAST:  160mL OMNIPAQUE IOHEXOL 300 MG/ML  SOLN COMPARISON:  06/25/2011 chest CT angiogram and 06/26/2011 CT abdomen. FINDINGS: CT CHEST FINDINGS Cardiovascular: Normal heart size. No significant pericardial effusion/thickening. Two lead left subclavian ICD is noted with lead tips in the right atrium and right ventricular apex. Left anterior descending coronary atherosclerosis. Atherosclerotic nonaneurysmal thoracic aorta. Normal caliber pulmonary arteries. No central pulmonary emboli. Mediastinum/Nodes: No discrete thyroid nodules. Unremarkable esophagus. No axillary adenopathy. Mildly enlarged  1.1 cm aortopulmonary node (series 3/image 23), new since 2012. No additional pathologically enlarged mediastinal nodes. No pathologically enlarged hilar nodes. Lungs/Pleura: No pneumothorax. No pleural effusion. Moderate centrilobular and paraseptal emphysema. No acute consolidative airspace disease or lung masses. Irregular spiculated posterior right middle lobe 1.5 x 0.7 cm pulmonary nodule abutting and distorting the right major fissure (series 5/image 97), which may represent growth of a 0.5 cm nodule seen in this location on 06/24/2011 chest CT. No additional significant pulmonary nodules. Parenchymal banding at the dependent left lung base compatible with nonspecific postinfectious/postinflammatory scarring. Musculoskeletal: No aggressive appearing focal osseous lesions. Moderate symmetric bilateral gynecomastia is unchanged. CT ABDOMEN PELVIS FINDINGS Hepatobiliary: Normal liver with no liver mass. Cholecystectomy. No biliary ductal dilatation.  Pancreas: Normal, with no mass or duct dilation. Spleen: Normal size. No mass. Adrenals/Urinary Tract: Right adrenal 3.2 cm nodule with density 45 HU minimally increased from 2.8 cm on 06/24/2011 CT. No left adrenal nodules. No hydronephrosis. Subcentimeter hypodense renal cortical lesion in the upper right kidney is too small to characterize and requires no follow-up. Scattered mild renal cortical scarring in the kidneys bilaterally. Normal bladder. Stomach/Bowel: Normal non-distended stomach. Normal caliber small bowel with no small bowel wall thickening. Appendix not discretely visualized. Normal large bowel with no diverticulosis, large bowel wall thickening or pericolonic fat stranding. Vascular/Lymphatic: Atherosclerotic nonaneurysmal abdominal aorta. Patent portal, splenic, hepatic and renal veins. No pathologically enlarged lymph nodes in the abdomen or pelvis. Reproductive: Top-normal size prostate. Other: No pneumoperitoneum, ascites or focal fluid collection. Musculoskeletal: No aggressive appearing focal osseous lesions. Healed deformity in the lateral left iliac wing. Moderate degenerative disc disease at L5-S1 with unilateral right L5 pars defect. IMPRESSION: 1. Spiculated 1.5 x 0.7 cm posterior right middle lobe pulmonary nodule abutting and distorting the right major fissure, suspicious for primary bronchogenic carcinoma. 2. Solitary mildly enlarged aortopulmonary mediastinal lymph node. Metastatic disease not excluded. 3. Indeterminate right adrenal 3.2 cm nodule, mildly increased since 2012 CT, more likely a slow growing adenoma. 4. Suggest short-term outpatient PET-CT for further staging evaluation. 5. Aortic Atherosclerosis (ICD10-I70.0) and Emphysema (ICD10-J43.9). Electronically Signed   By: Ilona Sorrel M.D.   On: 12/23/2019 18:57   CT ABDOMEN PELVIS W CONTRAST  Result Date: 12/23/2019 CLINICAL DATA:  Inpatient. Right frontal lobe cerebral mass with vasogenic edema on head CT. Assess for  primary neoplasm. EXAM: CT CHEST, ABDOMEN, AND PELVIS WITH CONTRAST TECHNIQUE: Multidetector CT imaging of the chest, abdomen and pelvis was performed following the standard protocol during bolus administration of intravenous contrast. CONTRAST:  127mL OMNIPAQUE IOHEXOL 300 MG/ML  SOLN COMPARISON:  06/25/2011 chest CT angiogram and 06/26/2011 CT abdomen. FINDINGS: CT CHEST FINDINGS Cardiovascular: Normal heart size. No significant pericardial effusion/thickening. Two lead left subclavian ICD is noted with lead tips in the right atrium and right ventricular apex. Left anterior descending coronary atherosclerosis. Atherosclerotic nonaneurysmal thoracic aorta. Normal caliber pulmonary arteries. No central pulmonary emboli. Mediastinum/Nodes: No discrete thyroid nodules. Unremarkable esophagus. No axillary adenopathy. Mildly enlarged 1.1 cm aortopulmonary node (series 3/image 23), new since 2012. No additional pathologically enlarged mediastinal nodes. No pathologically enlarged hilar nodes. Lungs/Pleura: No pneumothorax. No pleural effusion. Moderate centrilobular and paraseptal emphysema. No acute consolidative airspace disease or lung masses. Irregular spiculated posterior right middle lobe 1.5 x 0.7 cm pulmonary nodule abutting and distorting the right major fissure (series 5/image 97), which may represent growth of a 0.5 cm nodule seen in this location on 06/24/2011 chest CT. No additional significant pulmonary nodules. Parenchymal banding at the  dependent left lung base compatible with nonspecific postinfectious/postinflammatory scarring. Musculoskeletal: No aggressive appearing focal osseous lesions. Moderate symmetric bilateral gynecomastia is unchanged. CT ABDOMEN PELVIS FINDINGS Hepatobiliary: Normal liver with no liver mass. Cholecystectomy. No biliary ductal dilatation. Pancreas: Normal, with no mass or duct dilation. Spleen: Normal size. No mass. Adrenals/Urinary Tract: Right adrenal 3.2 cm nodule with  density 45 HU minimally increased from 2.8 cm on 06/24/2011 CT. No left adrenal nodules. No hydronephrosis. Subcentimeter hypodense renal cortical lesion in the upper right kidney is too small to characterize and requires no follow-up. Scattered mild renal cortical scarring in the kidneys bilaterally. Normal bladder. Stomach/Bowel: Normal non-distended stomach. Normal caliber small bowel with no small bowel wall thickening. Appendix not discretely visualized. Normal large bowel with no diverticulosis, large bowel wall thickening or pericolonic fat stranding. Vascular/Lymphatic: Atherosclerotic nonaneurysmal abdominal aorta. Patent portal, splenic, hepatic and renal veins. No pathologically enlarged lymph nodes in the abdomen or pelvis. Reproductive: Top-normal size prostate. Other: No pneumoperitoneum, ascites or focal fluid collection. Musculoskeletal: No aggressive appearing focal osseous lesions. Healed deformity in the lateral left iliac wing. Moderate degenerative disc disease at L5-S1 with unilateral right L5 pars defect. IMPRESSION: 1. Spiculated 1.5 x 0.7 cm posterior right middle lobe pulmonary nodule abutting and distorting the right major fissure, suspicious for primary bronchogenic carcinoma. 2. Solitary mildly enlarged aortopulmonary mediastinal lymph node. Metastatic disease not excluded. 3. Indeterminate right adrenal 3.2 cm nodule, mildly increased since 2012 CT, more likely a slow growing adenoma. 4. Suggest short-term outpatient PET-CT for further staging evaluation. 5. Aortic Atherosclerosis (ICD10-I70.0) and Emphysema (ICD10-J43.9). Electronically Signed   By: Ilona Sorrel M.D.   On: 12/23/2019 18:57      Assessment and Plan:   Preoperative risk assessment Chronic Systolic Heart Failure-Mixed NICM/ICM s/p ICD CAD Coronary angiography (12/2008) with mid LAD totally occluded and collateralized The patient has a complex cardiac history including HFrEF and CAD. He also has comorbidities  including CKD and DM and prior stroke. He is admitted with acute neurologic changes and has been found to have probable metastatic cancer of unknown primary involving his lungs and brain. Bronchoscopy with biopsy has been recommended. Due his cardiac comorbidities, pre-operative cardiology assessment has been requested. Given his history, the patient is at increased risk of perioperative cardiac event; by the RCRI, he has 15% of 30-day perioperative MACE (risk factors: CAD, CHF, DM requiring insulin, prior CVA). This risk was discussed with the patient tonight, and he expresses interest in proceeding with biopsy. Therefore, preoperative optimization of his cardiac comorbidities is appropriate. He denies any new cardiac symptoms and describes his dyspnea and volume status to be at his baseline. It is therefore unlikely that any further cardiac intervention will allow for a meaningful mitigation of his cardiac risk. -Agree with repeat echocardiogram, as last study was >1 year ago. -Recommend repeat pro-BNP to compare to prior baseline -Continue close monitoring of volume status, particularly with steroid use -Continue home carvedilol (taking 6.25 mg BID at home) -Home spironolactone and Entresto currently on hold. Restarting one or both of these medicines during hospitalization may be reasonable based on timing of his procedure.  -Continue atorvastatin -Continue anticoagulation per primary team (warfarin currently transitioned to heparin gtt). -May require ICD reprogramming for procedure       For questions or updates, please contact [facility] Please consult [email or web address] for contact info under     Signed, Nila Nephew, MD  12/25/2019 6:05 AM

## 2019-12-24 NOTE — Consult Note (Signed)
NAME:  Dennis Eisenberg., MRN:  [id number], DOB:  [birth year], LOS: 0 ADMISSION DATE:  12/23/2019, CONSULTATION DATE:  12/24/19 REFERRING MD:  FPTS, CHIEF COMPLAINT:  Lung mass   Brief History   Cerebral mass and lung mass in a patient with COPD and cardiomyopathy EF 20%  History of present illness   59 year old male with multiple medical problems including ejection fraction 25% as of January 2020 followed by Dr. Lamar Laundry 1 in the CHF clinic, type 2 diabetes, history of stroke, chronic Coumadin use for either the stroke history or cardiac issues, greater than 40 pack tobacco smoker. Admitted on December 23, 2019 with 4-day history of left-sided weakness and slurred speech and some balance issues preceding that. Work-up has shown a 1.5 cm right cerebral mass with mass-effect. There is on a CT head. MRI cannot be done because of his pacer. CT scan of the chest as shown another 1.5 cm right middle lobe lesion that spiculated and suspicious for non-small cell lung cancer. No clear-cut description of mediastinal adenopathy. Family practice teaching service spoke to neurosurgery who are requesting tissue diagnosis from bronchoscopy before addressing the cerebral met. Patient is on Decadron since arrival and the left sided hemiparesis is slightly improving but not fully.  Past Medical History       has a past medical history of Alcohol abuse with alcohol-induced mood disorder ([place]) (08/18/2015), Automatic implantable cardioverter-defibrillator in situ (2012), CAD (coronary artery disease) (11/16/2014), Cardiac LV ejection fraction 10-20%, Chest pain (09/04/2018), CHF (congestive heart failure) ([place]), Chronic systolic heart failure ([place]), Cocaine abuse ([place]) (01/24/2015), Cocaine abuse with cocaine-induced mood disorder ([place]) (08/20/2015), Drug abuse and dependence ([place]), Dysphagia following cerebral infarction (0/63/0160), Embolic stroke involving right middle cerebral artery ([place]), H/O noncompliance with medical  treatment, presenting hazards to health (01/24/2015), Heart murmur, High cholesterol, Ischemic cardiomyopathy, 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[place]), MDD (major depressive disorder), recurrent severe, without psychosis ([place]) (08/18/2015), Open wound of foot (02/27/2015), Pneumonia (1988), Psychoactive substance-induced mood disorder ([place]) (07/17/2015), Sleep apnea, Status post PICC central line placement, Stroke [facility]), Substance induced mood disorder ([place]) (08/17/2015), and Type II diabetes mellitus ([place]).   reports that he has been smoking cigarettes. He has a 7.75 pack-year smoking history. He has never used smokeless tobacco.  Past Surgical History:  Procedure Laterality Date  . CARDIAC CATHETERIZATION  05/2014  . CARDIAC DEFIBRILLATOR PLACEMENT  03/2011  . CHOLECYSTECTOMY  1994  . FOREARM FRACTURE SURGERY Left 1980  . FRACTURE SURGERY    . RIGHT HEART CATHETERIZATION N/A 05/30/2014   Procedure: RIGHT HEART CATH;  Surgeon: Jolaine Artist, MD;  Location: [facility] CATH LAB;  Service: Cardiovascular;  Laterality: N/A;  . RIGHT HEART CATHETERIZATION N/A 02/07/2015   Procedure: RIGHT HEART CATH;  Surgeon: Jolaine Artist, MD;  Location: [facility] CATH LAB;  Service: Cardiovascular;  Laterality: N/A;  . TONSILLECTOMY AND ADENOIDECTOMY  ~ 1998    No Known Allergies  Immunization History  Administered Date(s) Administered  . Influenza Whole 09/13/2007  . Influenza,inj,Quad PF,6+ Mos 09/22/2014, 08/18/2015  . Influenza-Unspecified 08/04/2013  . Pneumococcal Polysaccharide-23 09/13/2007, 08/18/2015    Family History  Problem Relation Age of Onset  . Diabetes Mother   . Heart disease Mother   . Diabetes Father   . Prostate cancer Father   . Heart disease Father   . Diabetes Brother   . Diabetes Brother      Current Facility-Administered Medications:  .  atorvastatin (LIPITOR) tablet 20 mg, 20 mg, Oral, q1800, Anderson, Chelsey L, DO, 20  mg at 12/23/19 1722 .  canagliflozin [facility]) tablet 100 mg,  100 mg, Oral, QAC breakfast, Anderson, Chelsey L, DO, 100 mg at 12/24/19 0640 .  carvedilol (COREG) tablet 9.375 mg, 9.375 mg, Oral, BID WC, Gladys Damme, MD, 9.375 mg at 12/24/19 0859 .  dexamethasone (DECADRON) tablet 6 mg, 6 mg, Oral, BID PC, McDiarmid, Blane Ohara, MD, 6 mg at 12/24/19 0859 .  insulin aspart (novoLOG) injection 0-9 Units, 0-9 Units, Subcutaneous, TID WC, Anderson, Chelsey L, DO, 3 Units at 12/24/19 1248 .  insulin glargine (LANTUS) injection 20 Units, 20 Units, Subcutaneous, Q2200, Richarda Osmond, DO, 20 Units at 12/23/19 2214 .  warfarin (COUMADIN) tablet 7.5 mg, 7.5 mg, Oral, ONCE-1800, Hammons, Kimberly B, RPH .  Warfarin - Pharmacist Dosing Inpatient, , Does not apply, q1800, Bertis Ruddy [facility], Given at 12/23/19 [place] [facility]   x  Consults:  x  Procedures:  x  Significant Diagnostic Tests:  x  Micro Data:  x  Antimicrobials:  x   Interim history/subjective:  3./13 - seen in 3w34 at [facility]  Objective   Blood pressure 113/80, pulse 90, temperature 97.8 F (36.6 C), temperature source Oral, resp. rate 16, height 6' (1.829 m), weight 77.1 kg, SpO2 99 %.        Intake/Output Summary (Last 24 hours) at 12/24/2019 1522 Last data filed at 12/24/2019 1207 Gross per 24 hour  Intake 960 ml  Output 900 ml  Net 60 ml   Filed Weights   12/23/19 1037  Weight: 77.1 kg    Examination: General: Well-built male. Anxious no distress HENT: No elevated JVP. No neck nodes oral cavity normal Lungs: Barrel chested.. Prolonged expiration no wheeze. Clear to auscultation Cardiovascular: Normal heart sounds Abdomen: Soft nontender no organomegaly Extremities: Clubbing present. No cyanosis or edema Neuro: Alert and oriented x3. Speech normal. Cognitively sharp. Mild left-sided weakness present GU: Not examined   LABS    PULMONARY Recent Labs  Lab 12/23/19 1132  TCO2 27    CBC Recent Labs  Lab 12/23/19 1056 12/23/19 1132  12/24/19 0411  HGB 13.1 14.3 12.7*  HCT 42.8 42.0 40.5  WBC 9.9  --  10.9*  PLT 280  --  288    COAGULATION Recent Labs  Lab 12/23/19 0857 12/23/19 1056 12/24/19 0411  INR 3.1* 2.4* 2.7*    CARDIAC  No results for input(s): TROPONINI in the last 168 hours. No results for input(s): PROBNP in the last 168 hours.   CHEMISTRY Recent Labs  Lab 12/23/19 1056 12/23/19 1056 12/23/19 1132 12/24/19 0411  NA 139  --  139 139  K 4.6   < > 4.6 4.0  CL 107  --  108 103  CO2 21*  --   --  23  GLUCOSE 135*  --  132* 149*  BUN 25*  --  27* 28*  CREATININE 1.64*  --  1.70* 1.60*  CALCIUM 9.1  --   --  8.8*   < > = values in this interval not displayed.   Estimated Creatinine Clearance: 55.5 mL/min (A) (by C-G formula based on SCr of 1.6 mg/dL (H)).   LIVER Recent Labs  Lab 12/23/19 0857 12/23/19 1056 12/24/19 0411  AST  --  18  --   ALT  --  13  --   ALKPHOS  --  96  --   BILITOT  --  1.0  --   PROT  --  6.7  --   ALBUMIN  --  3.3*  --   INR 3.1* 2.4* 2.7*     INFECTIOUS No results for input(s): LATICACIDVEN, PROCALCITON in the last 168 hours.   ENDOCRINE CBG (last 3)  Recent Labs    12/23/19 2221 12/24/19 0621 12/24/19 1123  GLUCAP 228* 130* 202*         IMAGING x48h  - image(s) personally visualized  -   highlighted in bold CT HEAD WO CONTRAST  Result Date: 12/23/2019 CLINICAL DATA:  Left arm weakness since Monday or Tuesday. Fall on Wednesday night. EXAM: CT HEAD WITHOUT CONTRAST TECHNIQUE: Contiguous axial images were obtained from the base of the skull through the vertex without intravenous contrast. COMPARISON:  01/24/2015 FINDINGS: Brain: 14 mm dense intra-axial mass with moderate vasogenic edema in the high right frontal lobe. Remote infarct in the right frontal operculum and insula. No acute hemorrhage or hydrocephalus. No acute infarct. Vascular: No hyperdense vessel. Skull: No evidence of fracture or bone lesion Sinuses/Orbits: Negative  IMPRESSION: 1. 14 mm right frontal mass with vasogenic edema. Recommend brain MRI with contrast. 2. Chronic right insular and opercular infarct. Electronically Signed   By: Monte Fantasia M.D.   On: 12/23/2019 11:16   CT HEAD W CONTRAST  Result Date: 12/23/2019 CLINICAL DATA:  Cerebral mass. EXAM: CT HEAD WITH CONTRAST TECHNIQUE: Contiguous axial images were obtained from the base of the skull through the vertex with intravenous contrast. CONTRAST:  124m OMNIPAQUE IOHEXOL 300 MG/ML  SOLN COMPARISON:  CT head without contrast 12/23/2019 FINDINGS: Brain: Postcontrast images confirm a homogeneously enhancing mass lesion in the posterior right frontal lobe measuring 1.4 x 1.5 x 1.5 cm. Surrounding vasogenic edema is present with partial effacement of sulci. No significant midline shift is present. No other foci of enhancement are present. Remote infarcts involving the right insular cortex as well as the anterior frontal lobe and precentral gyrus are again noted. The brainstem and cerebellum are within normal limits. The left hemisphere is unremarkable. Vascular: Contrast is present in the major vascular structures. Skull: Calvarium is intact. No focal lytic or blastic lesions are present. No significant extracranial soft tissue lesion is present. Sinuses/Orbits: The paranasal sinuses and mastoid air cells are clear. The globes and orbits are within normal limits. IMPRESSION: 1. 1.4 x 1.5 x 1.5 cm homogeneously enhancing mass lesion in the posterior right frontal lobe with surrounding vasogenic edema and partial effacement of sulci. This most likely represents a solitary metastasis. Primary brain tumor is not excluded. 2. No significant midline shift. 3. Remote infarcts involving the right insular cortex and anterior frontal lobe and precentral gyrus. Electronically Signed   By: CSan MorelleM.D.   On: 12/23/2019 19:12   CT CHEST W CONTRAST  Result Date: 12/23/2019 CLINICAL DATA:  Inpatient. Right  frontal lobe cerebral mass with vasogenic edema on head CT. Assess for primary neoplasm. EXAM: CT CHEST, ABDOMEN, AND PELVIS WITH CONTRAST TECHNIQUE: Multidetector CT imaging of the chest, abdomen and pelvis was performed following the standard protocol during bolus administration of intravenous contrast. CONTRAST:  1066mOMNIPAQUE IOHEXOL 300 MG/ML  SOLN COMPARISON:  06/25/2011 chest CT angiogram and 06/26/2011 CT abdomen. FINDINGS: CT CHEST FINDINGS Cardiovascular: Normal heart size. No significant pericardial effusion/thickening. Two lead left subclavian ICD is noted with lead tips in the right atrium and right ventricular apex. Left anterior descending coronary atherosclerosis. Atherosclerotic nonaneurysmal thoracic aorta. Normal caliber pulmonary arteries. No central pulmonary emboli. Mediastinum/Nodes: No discrete thyroid nodules. Unremarkable esophagus. No axillary adenopathy. Mildly enlarged 1.1 cm aortopulmonary node (series  3/image 23), new since 2012. No additional pathologically enlarged mediastinal nodes. No pathologically enlarged hilar nodes. Lungs/Pleura: No pneumothorax. No pleural effusion. Moderate centrilobular and paraseptal emphysema. No acute consolidative airspace disease or lung masses. Irregular spiculated posterior right middle lobe 1.5 x 0.7 cm pulmonary nodule abutting and distorting the right major fissure (series 5/image 97), which may represent growth of a 0.5 cm nodule seen in this location on 06/24/2011 chest CT. No additional significant pulmonary nodules. Parenchymal banding at the dependent left lung base compatible with nonspecific postinfectious/postinflammatory scarring. Musculoskeletal: No aggressive appearing focal osseous lesions. Moderate symmetric bilateral gynecomastia is unchanged. CT ABDOMEN PELVIS FINDINGS Hepatobiliary: Normal liver with no liver mass. Cholecystectomy. No biliary ductal dilatation. Pancreas: Normal, with no mass or duct dilation. Spleen: Normal size.  No mass. Adrenals/Urinary Tract: Right adrenal 3.2 cm nodule with density 45 HU minimally increased from 2.8 cm on 06/24/2011 CT. No left adrenal nodules. No hydronephrosis. Subcentimeter hypodense renal cortical lesion in the upper right kidney is too small to characterize and requires no follow-up. Scattered mild renal cortical scarring in the kidneys bilaterally. Normal bladder. Stomach/Bowel: Normal non-distended stomach. Normal caliber small bowel with no small bowel wall thickening. Appendix not discretely visualized. Normal large bowel with no diverticulosis, large bowel wall thickening or pericolonic fat stranding. Vascular/Lymphatic: Atherosclerotic nonaneurysmal abdominal aorta. Patent portal, splenic, hepatic and renal veins. No pathologically enlarged lymph nodes in the abdomen or pelvis. Reproductive: Top-normal size prostate. Other: No pneumoperitoneum, ascites or focal fluid collection. Musculoskeletal: No aggressive appearing focal osseous lesions. Healed deformity in the lateral left iliac wing. Moderate degenerative disc disease at L5-S1 with unilateral right L5 pars defect. IMPRESSION: 1. Spiculated 1.5 x 0.7 cm posterior right middle lobe pulmonary nodule abutting and distorting the right major fissure, suspicious for primary bronchogenic carcinoma. 2. Solitary mildly enlarged aortopulmonary mediastinal lymph node. Metastatic disease not excluded. 3. Indeterminate right adrenal 3.2 cm nodule, mildly increased since 2012 CT, more likely a slow growing adenoma. 4. Suggest short-term outpatient PET-CT for further staging evaluation. 5. Aortic Atherosclerosis (ICD10-I70.0) and Emphysema (ICD10-J43.9). Electronically Signed   By: Ilona Sorrel M.D.   On: 12/23/2019 18:57   CT ABDOMEN PELVIS W CONTRAST  Result Date: 12/23/2019 CLINICAL DATA:  Inpatient. Right frontal lobe cerebral mass with vasogenic edema on head CT. Assess for primary neoplasm. EXAM: CT CHEST, ABDOMEN, AND PELVIS WITH CONTRAST  TECHNIQUE: Multidetector CT imaging of the chest, abdomen and pelvis was performed following the standard protocol during bolus administration of intravenous contrast. CONTRAST:  159m OMNIPAQUE IOHEXOL 300 MG/ML  SOLN COMPARISON:  06/25/2011 chest CT angiogram and 06/26/2011 CT abdomen. FINDINGS: CT CHEST FINDINGS Cardiovascular: Normal heart size. No significant pericardial effusion/thickening. Two lead left subclavian ICD is noted with lead tips in the right atrium and right ventricular apex. Left anterior descending coronary atherosclerosis. Atherosclerotic nonaneurysmal thoracic aorta. Normal caliber pulmonary arteries. No central pulmonary emboli. Mediastinum/Nodes: No discrete thyroid nodules. Unremarkable esophagus. No axillary adenopathy. Mildly enlarged 1.1 cm aortopulmonary node (series 3/image 23), new since 2012. No additional pathologically enlarged mediastinal nodes. No pathologically enlarged hilar nodes. Lungs/Pleura: No pneumothorax. No pleural effusion. Moderate centrilobular and paraseptal emphysema. No acute consolidative airspace disease or lung masses. Irregular spiculated posterior right middle lobe 1.5 x 0.7 cm pulmonary nodule abutting and distorting the right major fissure (series 5/image 97), which may represent growth of a 0.5 cm nodule seen in this location on 06/24/2011 chest CT. No additional significant pulmonary nodules. Parenchymal banding at the dependent left lung base compatible  with nonspecific postinfectious/postinflammatory scarring. Musculoskeletal: No aggressive appearing focal osseous lesions. Moderate symmetric bilateral gynecomastia is unchanged. CT ABDOMEN PELVIS FINDINGS Hepatobiliary: Normal liver with no liver mass. Cholecystectomy. No biliary ductal dilatation. Pancreas: Normal, with no mass or duct dilation. Spleen: Normal size. No mass. Adrenals/Urinary Tract: Right adrenal 3.2 cm nodule with density 45 HU minimally increased from 2.8 cm on 06/24/2011 CT. No left  adrenal nodules. No hydronephrosis. Subcentimeter hypodense renal cortical lesion in the upper right kidney is too small to characterize and requires no follow-up. Scattered mild renal cortical scarring in the kidneys bilaterally. Normal bladder. Stomach/Bowel: Normal non-distended stomach. Normal caliber small bowel with no small bowel wall thickening. Appendix not discretely visualized. Normal large bowel with no diverticulosis, large bowel wall thickening or pericolonic fat stranding. Vascular/Lymphatic: Atherosclerotic nonaneurysmal abdominal aorta. Patent portal, splenic, hepatic and renal veins. No pathologically enlarged lymph nodes in the abdomen or pelvis. Reproductive: Top-normal size prostate. Other: No pneumoperitoneum, ascites or focal fluid collection. Musculoskeletal: No aggressive appearing focal osseous lesions. Healed deformity in the lateral left iliac wing. Moderate degenerative disc disease at L5-S1 with unilateral right L5 pars defect. IMPRESSION: 1. Spiculated 1.5 x 0.7 cm posterior right middle lobe pulmonary nodule abutting and distorting the right major fissure, suspicious for primary bronchogenic carcinoma. 2. Solitary mildly enlarged aortopulmonary mediastinal lymph node. Metastatic disease not excluded. 3. Indeterminate right adrenal 3.2 cm nodule, mildly increased since 2012 CT, more likely a slow growing adenoma. 4. Suggest short-term outpatient PET-CT for further staging evaluation. 5. Aortic Atherosclerosis (ICD10-I70.0) and Emphysema (ICD10-J43.9). Electronically Signed   By: Ilona Sorrel M.D.   On: 12/23/2019 18:57     Resolved Hospital Problem list   x  Assessment & Plan:  Right middle lobe 1.5 cm spiculated nodule Underlying smoker Underlying radiologic emphysema  Right cerebral metastasis on CT scan solitary with vasogenic edema 1.5 cm [unable to get MRI because of pacemaker]  On chronic anticoagulation with CKD EF 20%  Plan  -Meets indication for electro  navigational bronchoscopy with endobronchial ultrasound for tissue diagnosis of very likely non-small cell lung cancer  -Risks of sedation, hemothorax pneumothorax all explained. Patient wants to proceed ASAP  -Risk of nondiagnosis also explained. In this case we might have to resort to substantiating the clinical pretest probably of malignancy with the help of PET scan and also BIODESIK plasma sample   -Discussed with Dr. June Leap of navigational bronchoscopy wrist: He will evaluate for a date on December 26, 2019. Patient will be scheduled sometime during the week. Unclear when at this point   -Meanwhile he requested cardiac evaluation of anesthesia risk -discussed Dr. Caryl Comes who will see the patient   -Stop anticoagulation   -Get pulmonary function testing   -Get ABG   Best practice:  D/w patoeitn and resident Dr Duwaine Maxin    Dr. Brand Males, M.D., F.C.C.P,  Pulmonary and Critical Care Medicine Staff Physician, [place] Director - Interstitial Lung Disease  Program  [facility] [place] [facility] [place], [place], [address]  Pager: [phone], If no answer or between  15:00h - 7:00h: call [phone] Telephone: [phone]  3:23 PM 12/24/2019

## 2019-12-24 NOTE — Progress Notes (Signed)
FPTS Interim Progress Note  Spoke with on-call neurosurgeon Dr. Christella Noa.  Advised of Dr. Ernestina Penna request for input regarding possible removal.  He noted that mass could likely be removed, but would only do this in the situation of non-small cell carcinoma.  At this time, no neurosurgical intervention needed, recommends to proceed with oncologic workup.  Advised Dr. Christella Noa that we would reach out to Neurosurgery again should this be indicated after bronchoscopy results.  [place], DO 12/24/2019, 3:07 PM PGY-2, [place] Medicine Service pager [phone]

## 2019-12-24 NOTE — Progress Notes (Signed)
ANTICOAGULATION CONSULT NOTE - Follow-Up Consult  Pharmacy Consult for warfarin Indication: Left ventricular noncompaction  No Known Allergies  Patient Measurements: Height: 6' (182.9 cm) Weight: 170 lb (77.1 kg) IBW/kg (Calculated) : 77.6  Vital Signs: Temp: 98.2 F (36.8 C) (03/13 0836) Temp Source: Oral (03/13 0836) BP: 100/66 (03/13 0836) Pulse Rate: 90 (03/13 0836)  Labs: Recent Labs    12/23/19 0857 12/23/19 1056 12/23/19 1056 12/23/19 1132 12/24/19 0411  HGB  --  13.1   < > 14.3 12.7*  HCT  --  42.8  --  42.0 40.5  PLT  --  280  --   --  288  APTT  --  45*  --   --   --   LABPROT  --  25.9*  --   --  28.5*  INR 3.1* 2.4*  --   --  2.7*  CREATININE  --  1.64*  --  1.70* 1.60*   < > = values in this interval not displayed.    Estimated Creatinine Clearance: 55.5 mL/min (A) (by C-G formula based on SCr of 1.6 mg/dL (H)).   Medical History: Past Medical History:  Diagnosis Date  . Alcohol abuse with alcohol-induced mood disorder ([place]) 08/18/2015  . Automatic implantable cardioverter-defibrillator in situ 2012   S/P [employer] Dual Chamber. ICD.  [place] CAD (coronary artery disease) 11/16/2014   CAD Coronary angiography (12/2008) with mid LAD totally occluded and collateralized.  . Cardiac LV ejection fraction 10-20%   . Chest pain 09/04/2018  . CHF (congestive heart failure) ([place])   . Chronic systolic heart failure (HCC)    a) Mixed ICM/NICM b) RHC (05/2014): RA 2, RV 19/2/3, PA 22/14 (18), PCWP 6, Fick CO/CI: 5.2 / 2.7, PVR 2.3 WU, PA 60% and 64% c) ECHO (05/2014): EF 20-25%, diff HK, akinesis entireanteroseptal myocardium, triv AI, mod MR, LA mod/sev dilated  . Cocaine abuse ([place]) 01/24/2015  . Cocaine abuse with cocaine-induced mood disorder ([place]) 08/20/2015  . Drug abuse and dependence ([place])   . Dysphagia following cerebral infarction 01/24/2015  . Embolic stroke involving right middle cerebral artery ([place])   . H/O noncompliance with medical treatment, presenting  hazards to health 01/24/2015  . Heart murmur   . High cholesterol   . Ischemic cardiomyopathy    a) Coronary angiography (12/2008) at [facility]: L[place]: nl, LAD mid 100% stenosis with left to left and right-to-left collaterals to the distal LAD; Lcx: nl, RCA nl.    . 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[place])   . MDD (major depressive disorder), recurrent severe, without psychosis ([place]) 08/18/2015  . Open wound of foot 02/27/2015  . Pneumonia 1988  . Psychoactive substance-induced mood disorder ([place]) 07/17/2015  . Sleep apnea    "cleared after T&A"  . Status post PICC central line placement   . Stroke ([place])   . Substance induced mood disorder ([place]) 08/17/2015  . Type II diabetes mellitus (HCC)    Assessment: 59 YO M presenting with L sided weakness.  CT head showed new cerebral mass, concern for malignancy.  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  INR remains therapeutic on home regimen.  CBC wnl.     PTA dosing: 3.75mg  MWF, 7.5mg  all other days  Goal of Therapy:  INR 2-3 Monitor platelets by anticoagulation protocol: Yes   Plan:  Warfarin 7.5mg  PO x 1 today (per home regimen) Daily INR, s/s bleeding  [employer], Pharm.D., BCPS Clinical Pharmacist Clinical phone for 12/24/2019  from 8:30-4:00 is [phone].  **Pharmacist phone directory can be found on [email or web address] listed under [place].  12/24/2019 9:20 AM

## 2019-12-24 NOTE — Progress Notes (Addendum)
FPTS Interim Progress Note  Spoke with Dr. Chase Caller who is requesting cardiology consult for pre-op clearance given patient's low EF.  He would also specifically like them to give opinion for possible CNS resection if this would need to occur in the future.  Dr. Chase Caller notes that he has spoken to Dr. Valeta Harms about this patient and that we will plan for bronchoscopy early in the week, but patient would need to be off coumadin.  Spoke with on-call cardiologist for [facility] given patient follows in cardiology clinic with Dr. Haroldine Laws.  He states that they will see the patient and he will reach out to Dr. Chase Caller on his own to see what he specifically needs.  Spoke with pharmacist Lattie Haw who is covering floor today.  She reports that given patient has in his chart hx Left-ventricular noncompaction cardiomyopathy his risk of clotting would likely exceed the risk of bleeding, therefore will bridge with heparin and d/c warfarin.   [place], DO 12/24/2019, 5:00 PM PGY-2, [place] Service pager [phone]

## 2019-12-24 NOTE — Progress Notes (Signed)
FPTS Interim Progress Note  Book with on-call medical oncologist, Dr. Burr Medico.  She recommends calling neurosurgery to consult to see if there is anything that can be done from their surgical perspective.  Also recommends calling pulmonology for possible inpatient bronchoscopy, otherwise if needs outpatient would need to be within 1 week.  She states that she will formally consult on the patient today.  Appreciate her recommendations and look forward to her consultation.  We will reach out to pulmonology and neurosurgery.  [place], DO 12/24/2019, 2:59 PM PGY-2, [place] [facility] Service pager [phone]

## 2019-12-24 NOTE — Progress Notes (Signed)
Family Medicine Teaching Service Daily Progress Note Intern Pager: [phone]  Patient name: Dennis [name]. Medical record number: [id number] Date of birth: 1961/02/05 Age: 59 y.o. Gender: male  Primary Care Provider: System, Pcp Not In Consultants: None Code Status: Full  Pt Overview and Major Events to Date:  3/13 Admitted to [facility] with brain mass  Assessment and Plan: Dennis [name]. is a 59 y.o. male presenting with acute onset left-sided weakness. PMH is significant for type 2 diabetes, CHF with implantable defibrillator, tobacco use, warfarin use  Cerebral mass, suspected mets, with likely bronchogenic carcinoma Presented with 4 days of persistent left-sided weakness and speech slurring.  CT head with 14 mm right frontal mass with vasogenic edema and chronic right insular and opercular infarct.  Patient was unable to receive MRI with contrast given that he has an implantable defibrillator that is not compatible.  CT with contrast of head showed 1.4 x 1.5 x 1.5 cm homogenously enhancing mass lesion in posterior right frontal lobe with vasogenic edema and partial effacement of sulci, likely representing a solitary metastasis.  No significant midline shift.  Remote infarcts involving the right insular cortex and anterior frontal lobe, and precentral gyrus.  CT chest with contrast shows spiculated 1.5 x 0.7 cm posterior right middle lobe pulmonary nodule suspicious for primary bronchogenic carcinoma.  Solitary mildly enlarged aortopulmonary mediastinal lymph node, could not exclude metastasis.  Right adrenal 3.2 cm nodule, mildly increased since 2012 CT. Suggests outpatient PET scan for further staging.  Cr this AM stable at 1.6 from 1.7 yesterday after contrast load.  Patient continues to have 4/5 strength of left upper and lower extremities, speaking clearly.  No recent seizures.  HIV antibody negative. - cont dexamethasone 6 mg p.o. twice daily for vasogenic edema control - Consult oncology - We  will likely consult pulmonology for bronchoscopy - Will need eventual PET scan  - PT and OT eval - monitor Cr post contrast - consider palliative consult, patient declines at present  Type 2 diabetes mellitus Home meds equal 32 units Lantus nightly, Jardiance and metformin twice daily.   A1c 10.  CBG this a.m. 130.  Now on steroids, will likely cause increase in sugars. -Daily Lantus initially 20 units -Monitor CBGs and increase insulin as needed -Continue Jardiance -Sliding scale insulin -Hold Metformin  HFrEF, HTN VSS, BP this AM 100/66.  Echo 08/2018 showed ejection fraction 20 to 25% with moderate LVH, severe LVE, trace AI, mild MR, moderate LAE, mild TR.  Patient has implantable defibrillator seen properly placed on x-ray 09/2019.  Placed at [facility] 2012 and being monitored by heart care which just downloaded on Monday.  Home meds include carvedilol, Entresto, spironolactone, warfarin -holding entresto and spiro 24-48 hrs post contrast - monitor INR  H/o stroke, HLD Home meds include entresto, warfarin - continue warfarin - entresto per above  Tobacco use >40year pack history. Patient with likely lung malignancy per above. - counsel for tobacco cessation  FEN/GI: change to regular diet PPx: warfarin  Disposition: pending PT/OT recs and onc consult  Subjective:  Patient reports that he feels the same as he did yesterday.  Denies any improvement.  States that he was not aware that he had a mass in his brain.  He was very upset to hear that he may likely have lung cancer and a mass in his brain.  Objective: Temp:  [97.5 F (36.4 C)-98.5 F (36.9 C)] 98.2 F (36.8 C) (03/13 0836) Pulse Rate:  [83-99] 90 (03/13 0836)  Resp:  [16-20] 18 (03/13 0836) BP: (82-116)/(66-85) 100/66 (03/13 0836) SpO2:  [94 %-100 %] 98 % (03/13 0836) Weight:  [77.1 kg] 77.1 kg (03/12 1037)  Physical Exam:  General: 59 y.o. male in NAD, became visibly upset and angry when hearing of possible  cancer diagnosis Cardio: RRR no m/r/g Lungs: CTAB, no wheezing, no rhonchi, no crackles, no IWOB on RA Skin: warm and dry Extremities: No edema, 4/5 strength LUE, LLE Neuro: speech clear  Laboratory: Recent Labs  Lab 12/23/19 1056 12/23/19 1132 12/24/19 0411  WBC 9.9  --  10.9*  HGB 13.1 14.3 12.7*  HCT 42.8 42.0 40.5  PLT 280  --  288   Recent Labs  Lab 12/23/19 1056 12/23/19 1132 12/24/19 0411  NA 139 139 139  K 4.6 4.6 4.0  CL 107 108 103  CO2 21*  --  23  BUN 25* 27* 28*  CREATININE 1.64* 1.70* 1.60*  CALCIUM 9.1  --  8.8*  PROT 6.7  --   --   BILITOT 1.0  --   --   ALKPHOS 96  --   --   ALT 13  --   --   AST 18  --   --   GLUCOSE 135* 132* 149*     Imaging/Diagnostic Tests: CT HEAD WO CONTRAST  Result Date: 12/23/2019 CLINICAL DATA:  Left arm weakness since Monday or Tuesday. Fall on Wednesday night. EXAM: CT HEAD WITHOUT CONTRAST TECHNIQUE: Contiguous axial images were obtained from the base of the skull through the vertex without intravenous contrast. COMPARISON:  01/24/2015 FINDINGS: Brain: 14 mm dense intra-axial mass with moderate vasogenic edema in the high right frontal lobe. Remote infarct in the right frontal operculum and insula. No acute hemorrhage or hydrocephalus. No acute infarct. Vascular: No hyperdense vessel. Skull: No evidence of fracture or bone lesion Sinuses/Orbits: Negative IMPRESSION: 1. 14 mm right frontal mass with vasogenic edema. Recommend brain MRI with contrast. 2. Chronic right insular and opercular infarct. Electronically Signed   By: Monte Fantasia M.D.   On: 12/23/2019 11:16   CT HEAD W CONTRAST  Result Date: 12/23/2019 CLINICAL DATA:  Cerebral mass. EXAM: CT HEAD WITH CONTRAST TECHNIQUE: Contiguous axial images were obtained from the base of the skull through the vertex with intravenous contrast. CONTRAST:  179mL OMNIPAQUE IOHEXOL 300 MG/ML  SOLN COMPARISON:  CT head without contrast 12/23/2019 FINDINGS: Brain: Postcontrast images  confirm a homogeneously enhancing mass lesion in the posterior right frontal lobe measuring 1.4 x 1.5 x 1.5 cm. Surrounding vasogenic edema is present with partial effacement of sulci. No significant midline shift is present. No other foci of enhancement are present. Remote infarcts involving the right insular cortex as well as the anterior frontal lobe and precentral gyrus are again noted. The brainstem and cerebellum are within normal limits. The left hemisphere is unremarkable. Vascular: Contrast is present in the major vascular structures. Skull: Calvarium is intact. No focal lytic or blastic lesions are present. No significant extracranial soft tissue lesion is present. Sinuses/Orbits: The paranasal sinuses and mastoid air cells are clear. The globes and orbits are within normal limits. IMPRESSION: 1. 1.4 x 1.5 x 1.5 cm homogeneously enhancing mass lesion in the posterior right frontal lobe with surrounding vasogenic edema and partial effacement of sulci. This most likely represents a solitary metastasis. Primary brain tumor is not excluded. 2. No significant midline shift. 3. Remote infarcts involving the right insular cortex and anterior frontal lobe and precentral gyrus. Electronically Signed  By: San Morelle M.D.   On: 12/23/2019 19:12   CT CHEST W CONTRAST  Result Date: 12/23/2019 CLINICAL DATA:  Inpatient. Right frontal lobe cerebral mass with vasogenic edema on head CT. Assess for primary neoplasm. EXAM: CT CHEST, ABDOMEN, AND PELVIS WITH CONTRAST TECHNIQUE: Multidetector CT imaging of the chest, abdomen and pelvis was performed following the standard protocol during bolus administration of intravenous contrast. CONTRAST:  122mL OMNIPAQUE IOHEXOL 300 MG/ML  SOLN COMPARISON:  06/25/2011 chest CT angiogram and 06/26/2011 CT abdomen. FINDINGS: CT CHEST FINDINGS Cardiovascular: Normal heart size. No significant pericardial effusion/thickening. Two lead left subclavian ICD is noted with lead  tips in the right atrium and right ventricular apex. Left anterior descending coronary atherosclerosis. Atherosclerotic nonaneurysmal thoracic aorta. Normal caliber pulmonary arteries. No central pulmonary emboli. Mediastinum/Nodes: No discrete thyroid nodules. Unremarkable esophagus. No axillary adenopathy. Mildly enlarged 1.1 cm aortopulmonary node (series 3/image 23), new since 2012. No additional pathologically enlarged mediastinal nodes. No pathologically enlarged hilar nodes. Lungs/Pleura: No pneumothorax. No pleural effusion. Moderate centrilobular and paraseptal emphysema. No acute consolidative airspace disease or lung masses. Irregular spiculated posterior right middle lobe 1.5 x 0.7 cm pulmonary nodule abutting and distorting the right major fissure (series 5/image 97), which may represent growth of a 0.5 cm nodule seen in this location on 06/24/2011 chest CT. No additional significant pulmonary nodules. Parenchymal banding at the dependent left lung base compatible with nonspecific postinfectious/postinflammatory scarring. Musculoskeletal: No aggressive appearing focal osseous lesions. Moderate symmetric bilateral gynecomastia is unchanged. CT ABDOMEN PELVIS FINDINGS Hepatobiliary: Normal liver with no liver mass. Cholecystectomy. No biliary ductal dilatation. Pancreas: Normal, with no mass or duct dilation. Spleen: Normal size. No mass. Adrenals/Urinary Tract: Right adrenal 3.2 cm nodule with density 45 HU minimally increased from 2.8 cm on 06/24/2011 CT. No left adrenal nodules. No hydronephrosis. Subcentimeter hypodense renal cortical lesion in the upper right kidney is too small to characterize and requires no follow-up. Scattered mild renal cortical scarring in the kidneys bilaterally. Normal bladder. Stomach/Bowel: Normal non-distended stomach. Normal caliber small bowel with no small bowel wall thickening. Appendix not discretely visualized. Normal large bowel with no diverticulosis, large bowel  wall thickening or pericolonic fat stranding. Vascular/Lymphatic: Atherosclerotic nonaneurysmal abdominal aorta. Patent portal, splenic, hepatic and renal veins. No pathologically enlarged lymph nodes in the abdomen or pelvis. Reproductive: Top-normal size prostate. Other: No pneumoperitoneum, ascites or focal fluid collection. Musculoskeletal: No aggressive appearing focal osseous lesions. Healed deformity in the lateral left iliac wing. Moderate degenerative disc disease at L5-S1 with unilateral right L5 pars defect. IMPRESSION: 1. Spiculated 1.5 x 0.7 cm posterior right middle lobe pulmonary nodule abutting and distorting the right major fissure, suspicious for primary bronchogenic carcinoma. 2. Solitary mildly enlarged aortopulmonary mediastinal lymph node. Metastatic disease not excluded. 3. Indeterminate right adrenal 3.2 cm nodule, mildly increased since 2012 CT, more likely a slow growing adenoma. 4. Suggest short-term outpatient PET-CT for further staging evaluation. 5. Aortic Atherosclerosis (ICD10-I70.0) and Emphysema (ICD10-J43.9). Electronically Signed   By: Ilona Sorrel M.D.   On: 12/23/2019 18:57   CT ABDOMEN PELVIS W CONTRAST  Result Date: 12/23/2019 CLINICAL DATA:  Inpatient. Right frontal lobe cerebral mass with vasogenic edema on head CT. Assess for primary neoplasm. EXAM: CT CHEST, ABDOMEN, AND PELVIS WITH CONTRAST TECHNIQUE: Multidetector CT imaging of the chest, abdomen and pelvis was performed following the standard protocol during bolus administration of intravenous contrast. CONTRAST:  117mL OMNIPAQUE IOHEXOL 300 MG/ML  SOLN COMPARISON:  06/25/2011 chest CT angiogram and 06/26/2011 CT abdomen.  FINDINGS: CT CHEST FINDINGS Cardiovascular: Normal heart size. No significant pericardial effusion/thickening. Two lead left subclavian ICD is noted with lead tips in the right atrium and right ventricular apex. Left anterior descending coronary atherosclerosis. Atherosclerotic nonaneurysmal  thoracic aorta. Normal caliber pulmonary arteries. No central pulmonary emboli. Mediastinum/Nodes: No discrete thyroid nodules. Unremarkable esophagus. No axillary adenopathy. Mildly enlarged 1.1 cm aortopulmonary node (series 3/image 23), new since 2012. No additional pathologically enlarged mediastinal nodes. No pathologically enlarged hilar nodes. Lungs/Pleura: No pneumothorax. No pleural effusion. Moderate centrilobular and paraseptal emphysema. No acute consolidative airspace disease or lung masses. Irregular spiculated posterior right middle lobe 1.5 x 0.7 cm pulmonary nodule abutting and distorting the right major fissure (series 5/image 97), which may represent growth of a 0.5 cm nodule seen in this location on 06/24/2011 chest CT. No additional significant pulmonary nodules. Parenchymal banding at the dependent left lung base compatible with nonspecific postinfectious/postinflammatory scarring. Musculoskeletal: No aggressive appearing focal osseous lesions. Moderate symmetric bilateral gynecomastia is unchanged. CT ABDOMEN PELVIS FINDINGS Hepatobiliary: Normal liver with no liver mass. Cholecystectomy. No biliary ductal dilatation. Pancreas: Normal, with no mass or duct dilation. Spleen: Normal size. No mass. Adrenals/Urinary Tract: Right adrenal 3.2 cm nodule with density 45 HU minimally increased from 2.8 cm on 06/24/2011 CT. No left adrenal nodules. No hydronephrosis. Subcentimeter hypodense renal cortical lesion in the upper right kidney is too small to characterize and requires no follow-up. Scattered mild renal cortical scarring in the kidneys bilaterally. Normal bladder. Stomach/Bowel: Normal non-distended stomach. Normal caliber small bowel with no small bowel wall thickening. Appendix not discretely visualized. Normal large bowel with no diverticulosis, large bowel wall thickening or pericolonic fat stranding. Vascular/Lymphatic: Atherosclerotic nonaneurysmal abdominal aorta. Patent portal,  splenic, hepatic and renal veins. No pathologically enlarged lymph nodes in the abdomen or pelvis. Reproductive: Top-normal size prostate. Other: No pneumoperitoneum, ascites or focal fluid collection. Musculoskeletal: No aggressive appearing focal osseous lesions. Healed deformity in the lateral left iliac wing. Moderate degenerative disc disease at L5-S1 with unilateral right L5 pars defect. IMPRESSION: 1. Spiculated 1.5 x 0.7 cm posterior right middle lobe pulmonary nodule abutting and distorting the right major fissure, suspicious for primary bronchogenic carcinoma. 2. Solitary mildly enlarged aortopulmonary mediastinal lymph node. Metastatic disease not excluded. 3. Indeterminate right adrenal 3.2 cm nodule, mildly increased since 2012 CT, more likely a slow growing adenoma. 4. Suggest short-term outpatient PET-CT for further staging evaluation. 5. Aortic Atherosclerosis (ICD10-I70.0) and Emphysema (ICD10-J43.9). Electronically Signed   By: Ilona Sorrel M.D.   On: 12/23/2019 18:57    Chesnie Capell, Bernita Raisin, DO 12/24/2019, 9:07 AM PGY-2, [place] Intern pager: [phone], text pages welcome

## 2019-12-24 NOTE — Discharge Summary (Signed)
[facility] Discharge Summary  Patient name: Dennis [name]. Medical record number: [id number] Date of birth: November 17, 1960 Age: 59 y.o. Gender: male Date of Admission: 12/23/2019  Date of Discharge: 12/28/2019 Admitting Physician: Blane Ohara McDiarmid, MD  Primary Care Provider: System, Pcp Not In Consultants: Pulmonology  Indication for Hospitalization: Left-sided weakness and speech slurring  Discharge Diagnoses/Problem List:  Cerebral mass presenting with left-sided weakness suspected metastasis with likely lung primary AKI Type 2 diabetes mellitus HFrEF with ICD and hypertension History of stroke, hyperlipidemia Tobacco use  Disposition: Home  Discharge Condition: Improved, stable  Discharge Exam:  Temp:  [97.7 F (36.5 C)-98.1 F (36.7 C)] 97.7 F (36.5 C) (03/17 0341) Pulse Rate:  [78-81] 78 (03/17 0341) Resp:  [16-18] 16 (03/17 0341) BP: (106-141)/(78-93) 125/93 (03/17 0341) SpO2:  [96 %-100 %] 99 % (03/17 0341)  Physical Exam:  General: No acute distress, sleeping when I am in the room.  Easy to arouse Cardio: RRR no m/r/g  Lungs: No wheezes noted.  Clear to auscultation bilaterally Skin: warm and dry Extremities: No edema, 5/5 strength in left upper and lower extremity Neuro: speech clear Brief Hospital Course:  Dennis Morganis a 59 y.o.malepresenting with acute onset left-sided weakness. PMH is significant fortype 2 diabetes, CHF with implantable defibrillator, tobacco use, warfarin use.  His hospital course as outlined below.  Cerebral mass, suspected mets, with likely bronchogenic carcinoma Patient presented with 4 days of persistent left-sided weakness and speech slurring.  CT head in ED showed 14 mm right frontal mass with vasogenic edema and chronic right insular and opercular infarct.  Patient was unable to receive MRI with contrast given that he has an implantable defibrillator that is not compatible.  CT with contrast of head  showed 1.4 x 1.5 x 1.5 cm homogenously enhancing mass lesion in posterior right frontal lobe with vasogenic edema and partial effacement of sulci, likely representing a solitary metastasis.  No significant midline shift.  Remote infarcts involving the right insular cortex and anterior frontal lobe, and precentral gyrus.  CT chest with contrast shows spiculated 1.5 x 0.7 cm posterior right middle lobe pulmonary nodule suspicious for primary bronchogenic carcinoma.  Solitary mildly enlarged aortopulmonary mediastinal lymph node, could not exclude metastasis.  Right adrenal 3.2 cm nodule, mildly increased since 2012 CT. oncology was consulted and recommended biopsy via bronchoscopy.  Pulmonology was consulted to perform bronchoscopy.  Given patient's low EF of 20 to 25% and significant cardiac history, cardiology was consulted for clearance.  Patient was cleared for biopsy but it was necessary for patient's INR to be below 1.5.  Patient's warfarin was discontinued and INR was monitored.  Patient INR was below 1.5 threshold on 12/27/2019 but due to scheduling he was unable to receive biopsy that day.  Dr. Christella Noa of neurosurgery contacted and noted that neurosurgery would likely have nothing to add at this time until bronchoscopy biopsy results are back.  Biopsy was performed on 12/28/2019.  Adequate samples were retrieved and patient was monitored postop.  He was cleared for discharge by pulmonary critical care.  Cardiology provided recommendations regarding medication management and will him to follow-up in 2 weeks at their clinic.  Issues for Follow Up:  1. Prior to discharge, confirmed with Dr. Sallyanne Kuster that patient will continue on Entresto 49-51 until follow up with cardiology in 2 weeks.  This med sent to [facility] so patient could have in-hand on discharge. 2. Patient should continue on dexamethasone 4mg  BID for  cerebral edema until advised otherwise by his oncologist, Dr. Burr Medico.  He was also  given a one month supply of this medication prior to discharge. 3. Will need follow-up regarding results from biopsy  Significant Procedures: 3/17-flexible video fiberoptic bronchoscopy with endobronchial ultrasound biopsies  Significant Labs and Imaging:  Recent Labs  Lab 12/24/19 0411 12/25/19 0421 12/28/19 0333  WBC 10.9* 14.3* 14.7*  HGB 12.7* 11.5* 12.0*  HCT 40.5 36.0* 37.8*  PLT 288 277 293   Recent Labs  Lab 12/23/19 1056 12/23/19 1056 12/23/19 1132 12/23/19 1132 12/24/19 0411 12/24/19 0411 12/25/19 0421 12/28/19 0333  NA 139  --  139  --  139  --  138 136  K 4.6   < > 4.6   < > 4.0   < > 4.2 4.4  CL 107  --  108  --  103  --  107 102  CO2 21*  --   --   --  23  --  24 22  GLUCOSE 135*  --  132*  --  149*  --  169* 285*  BUN 25*  --  27*  --  28*  --  34* 30*  CREATININE 1.64*  --  1.70*  --  1.60*  --  1.39* 1.30*  CALCIUM 9.1  --   --   --  8.8*  --  8.8* 9.0  ALKPHOS 96  --   --   --   --   --   --   --   AST 18  --   --   --   --   --   --   --   ALT 13  --   --   --   --   --   --   --   ALBUMIN 3.3*  --   --   --   --   --   --   --    < > = values in this interval not displayed.     Results/Tests Pending at Time of Discharge: Biopsy results  Discharge Medications:  Allergies as of 12/28/2019   No Known Allergies     Medication List    STOP taking these medications   Entresto 97-103 MG Generic drug: sacubitril-valsartan Replaced by: sacubitril-valsartan 49-51 MG   tadalafil 20 MG tablet Commonly known as: Cialis     TAKE these medications   atorvastatin 20 MG tablet Commonly known as: LIPITOR TAKE 1 TABLET BY MOUTH DAILY AT 6 PM. What changed: See the new instructions.   carvedilol 6.25 MG tablet Commonly known as: COREG Take 1.5 tablets (9.375 mg total) by mouth 2 (two) times daily with a meal.   dexamethasone 4 MG tablet Commonly known as: DECADRON Take 1 tablet (4 mg total) by mouth 2 (two) times daily. Please also instruct pt  on taking omeprazole once daily while taking steroids   empagliflozin 10 MG Tabs tablet Commonly known as: JARDIANCE Take 10 mg by mouth daily before breakfast.   Lantus SoloStar 100 UNIT/ML Solostar Pen Generic drug: insulin glargine Inject 32 Units into the skin daily at 10 pm.   metFORMIN 500 MG tablet Commonly known as: GLUCOPHAGE Take 500 mg by mouth 2 (two) times daily with a meal.   sacubitril-valsartan 49-51 MG Commonly known as: ENTRESTO Take 1 tablet by mouth 2 (two) times daily. Replaces: Entresto 97-103 MG   spironolactone 25 MG tablet Commonly known as: ALDACTONE Take 1 tablet (25 mg total) by  mouth daily.   warfarin 7.5 MG tablet Commonly known as: COUMADIN Take as directed. If you are unsure how to take this medication, talk to your nurse or doctor. Original instructions: Take 1/2 or 1 tablet daily as directed by Coumadin clinic What changed:   how much to take  how to take this  when to take this  additional instructions       Discharge Instructions: Please refer to Patient Instructions section of EMR for full details.  Patient was counseled important signs and symptoms that should prompt return to medical care, changes in medications, dietary instructions, activity restrictions, and follow up appointments.   Follow-Up Appointments: Follow-up Information    [facility] up.   Specialty: Rehabilitation Why: The outpatient rehab will contact you for the first appointment.  Contact information: [address] [place] [id number] [place] [address] [phone]       June Leap L, DO In 3 weeks.   Specialty: Pulmonary Disease Contact information: [place] [address] [phone]        Jolaine Artist, MD Follow up on 01/10/2020.   Specialty: Cardiology Why: 2:30 pm for hospital follow up Contact information: [place] [address] [phone]           Gifford Shave, MD 12/28/2019, 5:04 PM PGY-1, [facility]

## 2019-12-25 ENCOUNTER — Inpatient Hospital Stay (HOSPITAL_COMMUNITY): Payer: Medicare Other

## 2019-12-25 DIAGNOSIS — R911 Solitary pulmonary nodule: Secondary | ICD-10-CM

## 2019-12-25 DIAGNOSIS — I340 Nonrheumatic mitral (valve) insufficiency: Secondary | ICD-10-CM

## 2019-12-25 LAB — PROTIME-INR
INR: 2.8 — ABNORMAL HIGH (ref 0.8–1.2)
Prothrombin Time: 29.4 seconds — ABNORMAL HIGH (ref 11.4–15.2)

## 2019-12-25 LAB — BLOOD GAS, ARTERIAL
Acid-base deficit: 0.6 mmol/L (ref 0.0–2.0)
Bicarbonate: 23.2 mmol/L (ref 20.0–28.0)
FIO2: 21
O2 Saturation: 94 %
Patient temperature: 36.7
pCO2 arterial: 35.3 mmHg (ref 32.0–48.0)
pH, Arterial: 7.431 (ref 7.350–7.450)
pO2, Arterial: 76.5 mmHg — ABNORMAL LOW (ref 83.0–108.0)

## 2019-12-25 LAB — GLUCOSE, CAPILLARY
Glucose-Capillary: 151 mg/dL — ABNORMAL HIGH (ref 70–99)
Glucose-Capillary: 246 mg/dL — ABNORMAL HIGH (ref 70–99)
Glucose-Capillary: 277 mg/dL — ABNORMAL HIGH (ref 70–99)
Glucose-Capillary: 402 mg/dL — ABNORMAL HIGH (ref 70–99)

## 2019-12-25 LAB — CBC
HCT: 36 % — ABNORMAL LOW (ref 39.0–52.0)
Hemoglobin: 11.5 g/dL — ABNORMAL LOW (ref 13.0–17.0)
MCH: 29.8 pg (ref 26.0–34.0)
MCHC: 31.9 g/dL (ref 30.0–36.0)
MCV: 93.3 fL (ref 80.0–100.0)
Platelets: 277 10*3/uL (ref 150–400)
RBC: 3.86 MIL/uL — ABNORMAL LOW (ref 4.22–5.81)
RDW: 13.6 % (ref 11.5–15.5)
WBC: 14.3 10*3/uL — ABNORMAL HIGH (ref 4.0–10.5)
nRBC: 0 % (ref 0.0–0.2)

## 2019-12-25 LAB — BASIC METABOLIC PANEL
Anion gap: 7 (ref 5–15)
BUN: 34 mg/dL — ABNORMAL HIGH (ref 6–20)
CO2: 24 mmol/L (ref 22–32)
Calcium: 8.8 mg/dL — ABNORMAL LOW (ref 8.9–10.3)
Chloride: 107 mmol/L (ref 98–111)
Creatinine, Ser: 1.39 mg/dL — ABNORMAL HIGH (ref 0.61–1.24)
GFR calc Af Amer: >60 mL/min (ref >60)
GFR calc non Af Amer: 56 mL/min — ABNORMAL LOW (ref >60)
Glucose, Bld: 169 mg/dL — ABNORMAL HIGH (ref 70–99)
Potassium: 4.2 mmol/L (ref 3.5–5.1)
Sodium: 138 mmol/L (ref 135–145)

## 2019-12-25 LAB — ECHOCARDIOGRAM COMPLETE
Height: 72 in
Weight: 2720 oz

## 2019-12-25 MED ORDER — INSULIN ASPART 100 UNIT/ML ~~LOC~~ SOLN
0.0000 [IU] | Freq: Three times a day (TID) | SUBCUTANEOUS | Status: DC
  Administered 2019-12-26: 2 [IU] via SUBCUTANEOUS
  Administered 2019-12-26: 5 [IU] via SUBCUTANEOUS
  Administered 2019-12-27: 2 [IU] via SUBCUTANEOUS
  Administered 2019-12-27: 3 [IU] via SUBCUTANEOUS
  Administered 2019-12-27 – 2019-12-28 (×2): 1 [IU] via SUBCUTANEOUS
  Administered 2019-12-28: 3 [IU] via SUBCUTANEOUS

## 2019-12-25 MED ORDER — PHYTONADIONE 5 MG PO TABS
5.0000 mg | ORAL_TABLET | Freq: Once | ORAL | Status: AC
  Administered 2019-12-25: 5 mg via ORAL
  Filled 2019-12-25 (×2): qty 1

## 2019-12-25 MED ORDER — INSULIN ASPART 100 UNIT/ML ~~LOC~~ SOLN
0.0000 [IU] | Freq: Every day | SUBCUTANEOUS | Status: DC
  Administered 2019-12-25: 5 [IU] via SUBCUTANEOUS
  Administered 2019-12-26: 2 [IU] via SUBCUTANEOUS
  Administered 2019-12-27: 4 [IU] via SUBCUTANEOUS

## 2019-12-25 MED ORDER — INSULIN GLARGINE 100 UNIT/ML ~~LOC~~ SOLN
25.0000 [IU] | Freq: Every day | SUBCUTANEOUS | Status: DC
  Administered 2019-12-25 – 2019-12-27 (×3): 25 [IU] via SUBCUTANEOUS
  Filled 2019-12-25 (×4): qty 0.25

## 2019-12-25 NOTE — Progress Notes (Addendum)
Family Medicine Teaching Service Daily Progress Note Intern Pager: [phone]  Patient name: Dennis [name]. Medical record number: [id number] Date of birth: [birth year] Age: 59 y.o. Gender: male  Primary Care Provider: System, Pcp Not In Consultants: None Code Status: Full  Pt Overview and Major Events to Date:  3/13 Admitted to [facility] with brain mass  Assessment and Plan: Dennis [name]. is a 59 y.o. male presenting with acute onset left-sided weakness. PMH is significant for type 2 diabetes, CHF with implantable defibrillator, tobacco use, warfarin use  Cerebral mass presenting with L-sided weakness (improved), suspected mets with likely lung primary Discussed with patient his diagnosis and answered several questions as best I could without formal diagnosis. Plan for near future is to complete recommended clearance tests including echo, PFT, ABG. Following these results, patient will likely undergo lung tissue biopsy via pulmonology to send to pathology for further differentiation of lesions. If patient tolerates procedure well, I would expect him to be able to go home and follow up with oncology outpatient for treatment plan. Recognized that this news is very overwhelming and offered patient counseling and chaplain services which he declined at this time. His daughter is coming to visit today and is very distressed about this news.  - f/u echo - f/u PFT, ABG - f/u pulmonology for biopsy - continue dexamethasone 6 mg p.o. twice daily for vasogenic edema control - PT/OT - oncology to f/u OP - continue heparin per pharm and consider switching from warfarin to doac s/p procedure  AKI- Cr elevated likely 2/2 media with CT scanning. Has been gradually improving. Cr 1.6>1.39 today - continue to monitor on daily labs  Type 2 diabetes mellitus- CBGs ranged 151-278 overnight in setting of steroid use and poor control prior to admission. A1c 10. - increase daily lantus from 20 to 25. Would also  recommend transition to morning dosing eventually for improved safety profile -Monitor CBGs -Continue Jardiance (canagloflozin per formulary) -Sliding scale insulin -Hold Metformin for contrast load  HFrEF with ICD, HTN- cards has been involved for risk stratification. Their recommendations are as follows: repeat echo (pending), BNP to establish baseline, continue hold entresto and spironolactone in setting of AKI but could consider restarting during hospitalization, heparin anticoag, continue steroids. Does not appear to be in decompensated HF as his VSS, no volume overload on exam and BNP only mildly elevated.  - continue home carvedilol - holding warfarin, entresto, sprinolactone  H/o stroke, HLD- Home meds include entresto, warfarin - per above  Tobacco use - counsel for tobacco cessation  FEN/GI: regular diet PPx: heparin   Disposition: pending lung tissue biopsy  Subjective:  Patient feels okay today physically but is understandably overwhelmed with his diagnosis. Anxious to get final results. Recognizes improvement in his left sided weakness.   Objective: Temp:  [97.8 F (36.6 C)-98.9 F (37.2 C)] 97.9 F (36.6 C) (03/14 0755) Pulse Rate:  [80-95] 89 (03/14 0755) Resp:  [16-18] 18 (03/14 0755) BP: (113-123)/(73-85) 123/85 (03/14 0755) SpO2:  [96 %-99 %] 97 % (03/14 0755)  Physical Exam:  General: 59 y.o. male in NAD, calm Cardio: RRR no m/r/g Lungs: CTAB, no wheezing, no rhonchi, no crackles, no IWOB on RA Skin: warm and dry Extremities: No edema, 4/5 strength LUE, LLE mildly improved since admission Neuro: speech clear  Laboratory: Recent Labs  Lab 12/23/19 1056 12/23/19 1056 12/23/19 1132 12/24/19 0411 12/25/19 0421  WBC 9.9  --   --  10.9* 14.3*  HGB 13.1   < >  14.3 12.7* 11.5*  HCT 42.8   < > 42.0 40.5 36.0*  PLT 280  --   --  288 277   < > = values in this interval not displayed.   Recent Labs  Lab 12/23/19 1056 12/23/19 1056 12/23/19 1132  12/24/19 0411 12/25/19 0421  NA 139   < > 139 139 138  K 4.6   < > 4.6 4.0 4.2  CL 107   < > 108 103 107  CO2 21*  --   --  23 24  BUN 25*   < > 27* 28* 34*  CREATININE 1.64*   < > 1.70* 1.60* 1.39*  CALCIUM 9.1  --   --  8.8* 8.8*  PROT 6.7  --   --   --   --   BILITOT 1.0  --   --   --   --   ALKPHOS 96  --   --   --   --   ALT 13  --   --   --   --   AST 18  --   --   --   --   GLUCOSE 135*   < > 132* 149* 169*   < > = values in this interval not displayed.     Imaging/Diagnostic Tests: CT HEAD W CONTRAST  Result Date: 12/23/2019 CLINICAL DATA:  Cerebral mass. EXAM: CT HEAD WITH CONTRAST TECHNIQUE: Contiguous axial images were obtained from the base of the skull through the vertex with intravenous contrast. CONTRAST:  177mL OMNIPAQUE IOHEXOL 300 MG/ML  SOLN COMPARISON:  CT head without contrast 12/23/2019 FINDINGS: Brain: Postcontrast images confirm a homogeneously enhancing mass lesion in the posterior right frontal lobe measuring 1.4 x 1.5 x 1.5 cm. Surrounding vasogenic edema is present with partial effacement of sulci. No significant midline shift is present. No other foci of enhancement are present. Remote infarcts involving the right insular cortex as well as the anterior frontal lobe and precentral gyrus are again noted. The brainstem and cerebellum are within normal limits. The left hemisphere is unremarkable. Vascular: Contrast is present in the major vascular structures. Skull: Calvarium is intact. No focal lytic or blastic lesions are present. No significant extracranial soft tissue lesion is present. Sinuses/Orbits: The paranasal sinuses and mastoid air cells are clear. The globes and orbits are within normal limits. IMPRESSION: 1. 1.4 x 1.5 x 1.5 cm homogeneously enhancing mass lesion in the posterior right frontal lobe with surrounding vasogenic edema and partial effacement of sulci. This most likely represents a solitary metastasis. Primary brain tumor is not excluded. 2. No  significant midline shift. 3. Remote infarcts involving the right insular cortex and anterior frontal lobe and precentral gyrus. Electronically Signed   By: San Morelle M.D.   On: 12/23/2019 19:12   CT CHEST W CONTRAST  Result Date: 12/23/2019 CLINICAL DATA:  Inpatient. Right frontal lobe cerebral mass with vasogenic edema on head CT. Assess for primary neoplasm. EXAM: CT CHEST, ABDOMEN, AND PELVIS WITH CONTRAST TECHNIQUE: Multidetector CT imaging of the chest, abdomen and pelvis was performed following the standard protocol during bolus administration of intravenous contrast. CONTRAST:  133mL OMNIPAQUE IOHEXOL 300 MG/ML  SOLN COMPARISON:  06/25/2011 chest CT angiogram and 06/26/2011 CT abdomen. FINDINGS: CT CHEST FINDINGS Cardiovascular: Normal heart size. No significant pericardial effusion/thickening. Two lead left subclavian ICD is noted with lead tips in the right atrium and right ventricular apex. Left anterior descending coronary atherosclerosis. Atherosclerotic nonaneurysmal thoracic aorta. Normal caliber pulmonary arteries.  No central pulmonary emboli. Mediastinum/Nodes: No discrete thyroid nodules. Unremarkable esophagus. No axillary adenopathy. Mildly enlarged 1.1 cm aortopulmonary node (series 3/image 23), new since 2012. No additional pathologically enlarged mediastinal nodes. No pathologically enlarged hilar nodes. Lungs/Pleura: No pneumothorax. No pleural effusion. Moderate centrilobular and paraseptal emphysema. No acute consolidative airspace disease or lung masses. Irregular spiculated posterior right middle lobe 1.5 x 0.7 cm pulmonary nodule abutting and distorting the right major fissure (series 5/image 97), which may represent growth of a 0.5 cm nodule seen in this location on 06/24/2011 chest CT. No additional significant pulmonary nodules. Parenchymal banding at the dependent left lung base compatible with nonspecific postinfectious/postinflammatory scarring. Musculoskeletal: No  aggressive appearing focal osseous lesions. Moderate symmetric bilateral gynecomastia is unchanged. CT ABDOMEN PELVIS FINDINGS Hepatobiliary: Normal liver with no liver mass. Cholecystectomy. No biliary ductal dilatation. Pancreas: Normal, with no mass or duct dilation. Spleen: Normal size. No mass. Adrenals/Urinary Tract: Right adrenal 3.2 cm nodule with density 45 HU minimally increased from 2.8 cm on 06/24/2011 CT. No left adrenal nodules. No hydronephrosis. Subcentimeter hypodense renal cortical lesion in the upper right kidney is too small to characterize and requires no follow-up. Scattered mild renal cortical scarring in the kidneys bilaterally. Normal bladder. Stomach/Bowel: Normal non-distended stomach. Normal caliber small bowel with no small bowel wall thickening. Appendix not discretely visualized. Normal large bowel with no diverticulosis, large bowel wall thickening or pericolonic fat stranding. Vascular/Lymphatic: Atherosclerotic nonaneurysmal abdominal aorta. Patent portal, splenic, hepatic and renal veins. No pathologically enlarged lymph nodes in the abdomen or pelvis. Reproductive: Top-normal size prostate. Other: No pneumoperitoneum, ascites or focal fluid collection. Musculoskeletal: No aggressive appearing focal osseous lesions. Healed deformity in the lateral left iliac wing. Moderate degenerative disc disease at L5-S1 with unilateral right L5 pars defect. IMPRESSION: 1. Spiculated 1.5 x 0.7 cm posterior right middle lobe pulmonary nodule abutting and distorting the right major fissure, suspicious for primary bronchogenic carcinoma. 2. Solitary mildly enlarged aortopulmonary mediastinal lymph node. Metastatic disease not excluded. 3. Indeterminate right adrenal 3.2 cm nodule, mildly increased since 2012 CT, more likely a slow growing adenoma. 4. Suggest short-term outpatient PET-CT for further staging evaluation. 5. Aortic Atherosclerosis (ICD10-I70.0) and Emphysema (ICD10-J43.9).  Electronically Signed   By: Ilona Sorrel M.D.   On: 12/23/2019 18:57   CT ABDOMEN PELVIS W CONTRAST  Result Date: 12/23/2019 CLINICAL DATA:  Inpatient. Right frontal lobe cerebral mass with vasogenic edema on head CT. Assess for primary neoplasm. EXAM: CT CHEST, ABDOMEN, AND PELVIS WITH CONTRAST TECHNIQUE: Multidetector CT imaging of the chest, abdomen and pelvis was performed following the standard protocol during bolus administration of intravenous contrast. CONTRAST:  148mL OMNIPAQUE IOHEXOL 300 MG/ML  SOLN COMPARISON:  06/25/2011 chest CT angiogram and 06/26/2011 CT abdomen. FINDINGS: CT CHEST FINDINGS Cardiovascular: Normal heart size. No significant pericardial effusion/thickening. Two lead left subclavian ICD is noted with lead tips in the right atrium and right ventricular apex. Left anterior descending coronary atherosclerosis. Atherosclerotic nonaneurysmal thoracic aorta. Normal caliber pulmonary arteries. No central pulmonary emboli. Mediastinum/Nodes: No discrete thyroid nodules. Unremarkable esophagus. No axillary adenopathy. Mildly enlarged 1.1 cm aortopulmonary node (series 3/image 23), new since 2012. No additional pathologically enlarged mediastinal nodes. No pathologically enlarged hilar nodes. Lungs/Pleura: No pneumothorax. No pleural effusion. Moderate centrilobular and paraseptal emphysema. No acute consolidative airspace disease or lung masses. Irregular spiculated posterior right middle lobe 1.5 x 0.7 cm pulmonary nodule abutting and distorting the right major fissure (series 5/image 97), which may represent growth of a 0.5 cm nodule seen  in this location on 06/24/2011 chest CT. No additional significant pulmonary nodules. Parenchymal banding at the dependent left lung base compatible with nonspecific postinfectious/postinflammatory scarring. Musculoskeletal: No aggressive appearing focal osseous lesions. Moderate symmetric bilateral gynecomastia is unchanged. CT ABDOMEN PELVIS FINDINGS  Hepatobiliary: Normal liver with no liver mass. Cholecystectomy. No biliary ductal dilatation. Pancreas: Normal, with no mass or duct dilation. Spleen: Normal size. No mass. Adrenals/Urinary Tract: Right adrenal 3.2 cm nodule with density 45 HU minimally increased from 2.8 cm on 06/24/2011 CT. No left adrenal nodules. No hydronephrosis. Subcentimeter hypodense renal cortical lesion in the upper right kidney is too small to characterize and requires no follow-up. Scattered mild renal cortical scarring in the kidneys bilaterally. Normal bladder. Stomach/Bowel: Normal non-distended stomach. Normal caliber small bowel with no small bowel wall thickening. Appendix not discretely visualized. Normal large bowel with no diverticulosis, large bowel wall thickening or pericolonic fat stranding. Vascular/Lymphatic: Atherosclerotic nonaneurysmal abdominal aorta. Patent portal, splenic, hepatic and renal veins. No pathologically enlarged lymph nodes in the abdomen or pelvis. Reproductive: Top-normal size prostate. Other: No pneumoperitoneum, ascites or focal fluid collection. Musculoskeletal: No aggressive appearing focal osseous lesions. Healed deformity in the lateral left iliac wing. Moderate degenerative disc disease at L5-S1 with unilateral right L5 pars defect. IMPRESSION: 1. Spiculated 1.5 x 0.7 cm posterior right middle lobe pulmonary nodule abutting and distorting the right major fissure, suspicious for primary bronchogenic carcinoma. 2. Solitary mildly enlarged aortopulmonary mediastinal lymph node. Metastatic disease not excluded. 3. Indeterminate right adrenal 3.2 cm nodule, mildly increased since 2012 CT, more likely a slow growing adenoma. 4. Suggest short-term outpatient PET-CT for further staging evaluation. 5. Aortic Atherosclerosis (ICD10-I70.0) and Emphysema (ICD10-J43.9). Electronically Signed   By: Ilona Sorrel M.D.   On: 12/23/2019 18:57     Richarda Osmond, DO 12/25/2019, 11:15 AM PGY-2, [facility] Intern pager: [phone], text pages welcome

## 2019-12-25 NOTE — Evaluation (Signed)
Physical Therapy Evaluation Patient Details Name: Dennis [name]. MRN: [id number] DOB: [birth year] Today's Date: 12/25/2019   History of Present Illness  Dennis [name]. is a 59 y.o. male presenting with acute onset left-sided weakness. PMH is significant for type 2 diabetes, CHF with implantable defibrillator, tobacco use, warfarin use. 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 Per chartProbably metastatic lung cancer to brain    Clinical Impression  Cooperative pt with some left leg weakness - he is functional but limited with higher level balance activities.  Pt would benefit from OPPT to address this.  Pt will continue to receive skilled PT services while on acute.    Follow Up Recommendations Outpatient PT;Supervision for mobility/OOB    Equipment Recommendations  None recommended by PT    Recommendations for Other Services       Precautions / Restrictions Precautions Precautions: Fall Precaution Comments: left leg weakness - denies history of falls Restrictions Weight Bearing Restrictions: No Other Position/Activity Restrictions: pt denies dizziness      Mobility  Bed Mobility Overal bed mobility: Independent                Transfers Overall transfer level: Needs assistance Equipment used: None Transfers: Sit to/from Stand Sit to Stand: Supervision            Ambulation/Gait Ambulation/Gait assistance: Min guard Gait Distance (Feet): 150 Feet Assistive device: None       General Gait Details: I had pt march, head turns, walk on heels and walk on toes (hard) - needs min guard doing this - occasional loss of balance but able to self correct  Stairs Stairs: Yes Stairs assistance: Min guard Stair Management: One rail Left;Alternating pattern Number of Stairs: 5 General stair comments: pt went up and over the rehab steps twice - alternating feet  with minimal UE assist  Wheelchair Mobility    Modified Rankin (Stroke Patients Only)       Balance Overall balance assessment: Needs assistance Sitting-balance support: No upper extremity supported;Feet supported Sitting balance-Leahy Scale: Good     Standing balance support: No upper extremity supported Standing balance-Leahy Scale: Good Standing balance comment: static standing normal, dynamic testing pt had diffculty with single limb stance LLE and walking on toes. He did well walking on heels and bending over to pick item up off of floor                             Pertinent Vitals/Pain Pain Assessment: No/denies pain    Home Living Family/patient expects to be discharged to:: Private residence Living Arrangements: Alone Available Help at Discharge: Family;Available PRN/intermittently Type of Home: House Home Access: Stairs to enter   [employer] of Steps: 6 Home Layout: One level Home Equipment: None Additional Comments: pt  has girlfriend to assist.  pt has 46 year old daughter that lives with her mom.    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  pt denies numbness but said occasional tingling from DM and neuropathy in both feet    Cervical / Trunk Assessment Cervical / Trunk Assessment: Normal  Communication   Communication: No difficulties  Cognition  Arousal/Alertness: Awake/alert Behavior During Therapy: WFL for tasks assessed/performed Overall Cognitive Status: Within Functional Limits for tasks assessed                                 General Comments: pt very pleasant and cooperative      General Comments      Exercises     Assessment/Plan    PT Assessment Patient needs continued PT services  PT Problem List Decreased strength;Decreased safety awareness;Decreased activity tolerance;Decreased balance       PT Treatment Interventions Therapeutic activities;Balance training;Neuromuscular re-education;Gait training;Therapeutic exercise;Patient/family education;Functional mobility training    PT Goals (Current goals can be found in the Care Plan section)  Acute Rehab PT Goals Patient Stated Goal: to go home and get back to work PT Goal Formulation: With patient Time For Goal Achievement: 01/01/20 Potential to Achieve Goals: Good    Frequency Min 3X/week   Barriers to discharge Decreased caregiver support      Co-evaluation PT/OT/SLP Co-Evaluation/Treatment: Yes Reason for Co-Treatment: To address functional/ADL transfers PT goals addressed during session: Mobility/safety with mobility;Balance OT goals addressed during session: ADL's and self-care       AM-PAC PT "6 Clicks" Mobility  Outcome Measure Help needed turning from your back to your side while in a flat bed without using bedrails?: None Help needed moving from lying on your back to sitting on the side of a flat bed without using bedrails?: None Help needed moving to and from a bed to a chair (including a wheelchair)?: A Little Help needed standing up from a chair using your arms (e.g., wheelchair or bedside chair)?: A Little Help needed to walk in hospital room?: A Little Help needed climbing 3-5 steps with a railing? : A Little 6 Click Score: 20    End of Session Equipment Utilized During Treatment: Gait belt Activity Tolerance:  Patient tolerated treatment well Patient left: in chair;with chair alarm set;with call bell/phone within reach Nurse Communication: Mobility status PT Visit Diagnosis: Unsteadiness on feet (R26.81);Hemiplegia and hemiparesis;Muscle weakness (generalized) (M62.81) Hemiplegia - Right/Left: Left Hemiplegia - caused by: Other cerebrovascular disease    Time: 1108-1130 PT Time Calculation (min) (ACUTE ONLY): 22 min   Charges:   PT Evaluation $PT Eval Low Complexity: 1 Low         12/25/2019   Rande Lawman, PT   Loyal Buba 12/25/2019, 12:32 PM

## 2019-12-25 NOTE — Progress Notes (Signed)
Echocardiogram 2D Echocardiogram has been performed.  Oneal Deputy Senior 12/25/2019, 10:32 AM

## 2019-12-25 NOTE — Consult Note (Signed)
NAME:  Dennis Bolander., MRN:  [id number], DOB:  [birth year], LOS: 1 ADMISSION DATE:  12/23/2019, CONSULTATION DATE:  12/24/19 REFERRING MD:  FPTS, CHIEF COMPLAINT:  Lung mass   Brief History   Cerebral mass and lung mass in a patient with COPD and cardiomyopathy EF 20%  History of present illness   59 year old male with multiple medical problems including ejection fraction 25% as of January 2020 followed by Dr. Lamar Laundry 1 in the CHF clinic, type 2 diabetes, history of stroke, chronic Coumadin use for either the stroke history or cardiac issues, greater than 40 pack tobacco smoker. Admitted on December 23, 2019 with 4-day history of left-sided weakness and slurred speech and some balance issues preceding that. Work-up has shown a 1.5 cm right cerebral mass with mass-effect. There is on a CT head. MRI cannot be done because of his pacer. CT scan of the chest as shown another 1.5 cm right middle lobe lesion that spiculated and suspicious for non-small cell lung cancer. No clear-cut description of mediastinal adenopathy. Family practice teaching service spoke to neurosurgery who are requesting tissue diagnosis from bronchoscopy before addressing the cerebral met. Patient is on Decadron since arrival and the left sided hemiparesis is slightly improving but not fully.  Past Medical History       has a past medical history of Alcohol abuse with alcohol-induced mood disorder ([place]) (08/18/2015), Automatic implantable cardioverter-defibrillator in situ (2012), CAD (coronary artery disease) (11/16/2014), Cardiac LV ejection fraction 10-20%, Chest pain (09/04/2018), CHF (congestive heart failure) ([place]), Chronic systolic heart failure ([place]), Cocaine abuse ([place]) (01/24/2015), Cocaine abuse with cocaine-induced mood disorder ([place]) (08/20/2015), Drug abuse and dependence ([place]), Dysphagia following cerebral infarction (01/01/253), Embolic stroke involving right middle cerebral artery ([place]), H/O noncompliance with medical  treatment, presenting hazards to health (01/24/2015), Heart murmur, High cholesterol, Ischemic cardiomyopathy, 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[place]), MDD (major depressive disorder), recurrent severe, without psychosis ([place]) (08/18/2015), Open wound of foot (02/27/2015), Pneumonia (1988), Psychoactive substance-induced mood disorder ([place]) (07/17/2015), Sleep apnea, Status post PICC central line placement, Stroke [facility]), Substance induced mood disorder ([place]) (08/17/2015), and Type II diabetes mellitus ([place]).   reports that he has been smoking cigarettes. He has a 7.75 pack-year smoking history. He has never used smokeless tobacco.  Past Surgical History:  Procedure Laterality Date  . CARDIAC CATHETERIZATION  05/2014  . CARDIAC DEFIBRILLATOR PLACEMENT  03/2011  . CHOLECYSTECTOMY  1994  . FOREARM FRACTURE SURGERY Left 1980  . FRACTURE SURGERY    . RIGHT HEART CATHETERIZATION N/A 05/30/2014   Procedure: RIGHT HEART CATH;  Surgeon: Jolaine Artist, MD;  Location: [facility] CATH LAB;  Service: Cardiovascular;  Laterality: N/A;  . RIGHT HEART CATHETERIZATION N/A 02/07/2015   Procedure: RIGHT HEART CATH;  Surgeon: Jolaine Artist, MD;  Location: [facility] CATH LAB;  Service: Cardiovascular;  Laterality: N/A;  . TONSILLECTOMY AND ADENOIDECTOMY  ~ 1998    No Known Allergies  Immunization History  Administered Date(s) Administered  . Influenza Whole 09/13/2007  . Influenza,inj,Quad PF,6+ Mos 09/22/2014, 08/18/2015  . Influenza-Unspecified 08/04/2013  . Pneumococcal Polysaccharide-23 09/13/2007, 08/18/2015    Family History  Problem Relation Age of Onset  . Diabetes Mother   . Heart disease Mother   . Diabetes Father   . Prostate cancer Father   . Heart disease Father   . Diabetes Brother   . Diabetes Brother      Current Facility-Administered Medications:  .  atorvastatin (LIPITOR) tablet 20 mg, 20 mg, Oral, q1800, Anderson, Chelsey L, DO, 20  mg at 12/24/19 1726 .  canagliflozin (INVOKANA) tablet 100 mg,  100 mg, Oral, QAC breakfast, Anderson, Chelsey L, DO, 100 mg at 12/25/19 0738 .  carvedilol (COREG) tablet 9.375 mg, 9.375 mg, Oral, BID WC, Gladys Damme, MD, 9.375 mg at 12/25/19 0837 .  dexamethasone (DECADRON) tablet 6 mg, 6 mg, Oral, BID PC, McDiarmid, Blane Ohara, MD, 6 mg at 12/25/19 0740 .  insulin aspart (novoLOG) injection 0-9 Units, 0-9 Units, Subcutaneous, TID WC, Anderson, Chelsey L, DO, 3 Units at 12/25/19 1232 .  insulin glargine (LANTUS) injection 25 Units, 25 Units, Subcutaneous, Q2200, Anderson, Chelsey L, DO .  phytonadione (VITAMIN K) tablet 5 mg, 5 mg, Oral, Once, Brand Males, MD   Significant Hospital Events   x  Consults:  x  Procedures:  x  Significant Diagnostic Tests:  x  Micro Data:  x  Antimicrobials:  x   Interim history/subjective:   12/25/2019 - seen by cards and onc. Anxious to have bronch.  -ENB with EBUS. Off coumadin since yeserday but INR still > 2. Left sided weakness better. On decadron.AKI better. D.w Dr Ouida Sills of [place] for [place] possibly after bronch. ECHo 3/14 today - ef again 25%  Recent Labs  Lab 12/23/19 0857 12/23/19 1056 12/24/19 0411 12/25/19 0421  INR 3.1* 2.4* 2.7* 2.8*      Objective   Blood pressure 104/74, pulse 88, temperature 98.1 F (36.7 C), temperature source Oral, resp. rate 18, height 6' (1.829 m), weight 77.1 kg, SpO2 100 %.        Intake/Output Summary (Last 24 hours) at 12/25/2019 1414 Last data filed at 12/25/2019 1000 Gross per 24 hour  Intake 480 ml  Output --  Net 480 ml   Filed Weights   12/23/19 1037  Weight: 77.1 kg    Examination: General: Well-built male. Anxious no distress. Euvolemic HENT: No elevated JVP. No neck nodes oral cavity normal Lungs: No disterss Cardiovascular: Normal heart sounds Abdomen: Soft nontender no organomegaly Extremities: Clubbing present. No cyanosis or edema Neuro: Alert and oriented x3. Speech normal. Cognitively sharp. Mild left-sided weakness  present - some better GU: Not examined   LABS    PULMONARY Recent Labs  Lab 12/23/19 1132  TCO2 27    CBC Recent Labs  Lab 12/23/19 1056 12/23/19 1056 12/23/19 1132 12/24/19 0411 12/25/19 0421  HGB 13.1   < > 14.3 12.7* 11.5*  HCT 42.8   < > 42.0 40.5 36.0*  WBC 9.9  --   --  10.9* 14.3*  PLT 280  --   --  288 277   < > = values in this interval not displayed.    COAGULATION Recent Labs  Lab 12/23/19 0857 12/23/19 1056 12/24/19 0411 12/25/19 0421  INR 3.1* 2.4* 2.7* 2.8*    CARDIAC  No results for input(s): TROPONINI in the last 168 hours. No results for input(s): PROBNP in the last 168 hours.   CHEMISTRY Recent Labs  Lab 12/23/19 1056 12/23/19 1056 12/23/19 1132 12/23/19 1132 12/24/19 0411 12/25/19 0421  NA 139  --  139  --  139 138  K 4.6   < > 4.6   < > 4.0 4.2  CL 107  --  108  --  103 107  CO2 21*  --   --   --  23 24  GLUCOSE 135*  --  132*  --  149* 169*  BUN 25*  --  27*  --  28* 34*  CREATININE 1.64*  --  1.70*  --  1.60* 1.39*  CALCIUM 9.1  --   --   --  8.8* 8.8*   < > = values in this interval not displayed.   Estimated Creatinine Clearance: 63.9 mL/min (A) (by C-G formula based on SCr of 1.39 mg/dL (H)).   LIVER Recent Labs  Lab 12/23/19 0857 12/23/19 1056 12/24/19 0411 12/25/19 0421  AST  --  18  --   --   ALT  --  13  --   --   ALKPHOS  --  96  --   --   BILITOT  --  1.0  --   --   PROT  --  6.7  --   --   ALBUMIN  --  3.3*  --   --   INR 3.1* 2.4* 2.7* 2.8*     INFECTIOUS No results for input(s): LATICACIDVEN, PROCALCITON in the last 168 hours.   ENDOCRINE CBG (last 3)  Recent Labs    12/24/19 2109 12/25/19 0605 12/25/19 1125  GLUCAP 278* 151* 246*         IMAGING x48h  - image(s) personally visualized  -   highlighted in bold CT HEAD W CONTRAST  Result Date: 12/23/2019 CLINICAL DATA:  Cerebral mass. EXAM: CT HEAD WITH CONTRAST TECHNIQUE: Contiguous axial images were obtained from the base of the  skull through the vertex with intravenous contrast. CONTRAST:  142m OMNIPAQUE IOHEXOL 300 MG/ML  SOLN COMPARISON:  CT head without contrast 12/23/2019 FINDINGS: Brain: Postcontrast images confirm a homogeneously enhancing mass lesion in the posterior right frontal lobe measuring 1.4 x 1.5 x 1.5 cm. Surrounding vasogenic edema is present with partial effacement of sulci. No significant midline shift is present. No other foci of enhancement are present. Remote infarcts involving the right insular cortex as well as the anterior frontal lobe and precentral gyrus are again noted. The brainstem and cerebellum are within normal limits. The left hemisphere is unremarkable. Vascular: Contrast is present in the major vascular structures. Skull: Calvarium is intact. No focal lytic or blastic lesions are present. No significant extracranial soft tissue lesion is present. Sinuses/Orbits: The paranasal sinuses and mastoid air cells are clear. The globes and orbits are within normal limits. IMPRESSION: 1. 1.4 x 1.5 x 1.5 cm homogeneously enhancing mass lesion in the posterior right frontal lobe with surrounding vasogenic edema and partial effacement of sulci. This most likely represents a solitary metastasis. Primary brain tumor is not excluded. 2. No significant midline shift. 3. Remote infarcts involving the right insular cortex and anterior frontal lobe and precentral gyrus. Electronically Signed   By: CSan MorelleM.D.   On: 12/23/2019 19:12   CT CHEST W CONTRAST  Result Date: 12/23/2019 CLINICAL DATA:  Inpatient. Right frontal lobe cerebral mass with vasogenic edema on head CT. Assess for primary neoplasm. EXAM: CT CHEST, ABDOMEN, AND PELVIS WITH CONTRAST TECHNIQUE: Multidetector CT imaging of the chest, abdomen and pelvis was performed following the standard protocol during bolus administration of intravenous contrast. CONTRAST:  1059mOMNIPAQUE IOHEXOL 300 MG/ML  SOLN COMPARISON:  06/25/2011 chest CT angiogram  and 06/26/2011 CT abdomen. FINDINGS: CT CHEST FINDINGS Cardiovascular: Normal heart size. No significant pericardial effusion/thickening. Two lead left subclavian ICD is noted with lead tips in the right atrium and right ventricular apex. Left anterior descending coronary atherosclerosis. Atherosclerotic nonaneurysmal thoracic aorta. Normal caliber pulmonary arteries. No central pulmonary emboli. Mediastinum/Nodes: No discrete thyroid nodules. Unremarkable esophagus. No axillary adenopathy. Mildly enlarged 1.1 cm aortopulmonary node (series 3/image  23), new since 2012. No additional pathologically enlarged mediastinal nodes. No pathologically enlarged hilar nodes. Lungs/Pleura: No pneumothorax. No pleural effusion. Moderate centrilobular and paraseptal emphysema. No acute consolidative airspace disease or lung masses. Irregular spiculated posterior right middle lobe 1.5 x 0.7 cm pulmonary nodule abutting and distorting the right major fissure (series 5/image 97), which may represent growth of a 0.5 cm nodule seen in this location on 06/24/2011 chest CT. No additional significant pulmonary nodules. Parenchymal banding at the dependent left lung base compatible with nonspecific postinfectious/postinflammatory scarring. Musculoskeletal: No aggressive appearing focal osseous lesions. Moderate symmetric bilateral gynecomastia is unchanged. CT ABDOMEN PELVIS FINDINGS Hepatobiliary: Normal liver with no liver mass. Cholecystectomy. No biliary ductal dilatation. Pancreas: Normal, with no mass or duct dilation. Spleen: Normal size. No mass. Adrenals/Urinary Tract: Right adrenal 3.2 cm nodule with density 45 HU minimally increased from 2.8 cm on 06/24/2011 CT. No left adrenal nodules. No hydronephrosis. Subcentimeter hypodense renal cortical lesion in the upper right kidney is too small to characterize and requires no follow-up. Scattered mild renal cortical scarring in the kidneys bilaterally. Normal bladder. Stomach/Bowel:  Normal non-distended stomach. Normal caliber small bowel with no small bowel wall thickening. Appendix not discretely visualized. Normal large bowel with no diverticulosis, large bowel wall thickening or pericolonic fat stranding. Vascular/Lymphatic: Atherosclerotic nonaneurysmal abdominal aorta. Patent portal, splenic, hepatic and renal veins. No pathologically enlarged lymph nodes in the abdomen or pelvis. Reproductive: Top-normal size prostate. Other: No pneumoperitoneum, ascites or focal fluid collection. Musculoskeletal: No aggressive appearing focal osseous lesions. Healed deformity in the lateral left iliac wing. Moderate degenerative disc disease at L5-S1 with unilateral right L5 pars defect. IMPRESSION: 1. Spiculated 1.5 x 0.7 cm posterior right middle lobe pulmonary nodule abutting and distorting the right major fissure, suspicious for primary bronchogenic carcinoma. 2. Solitary mildly enlarged aortopulmonary mediastinal lymph node. Metastatic disease not excluded. 3. Indeterminate right adrenal 3.2 cm nodule, mildly increased since 2012 CT, more likely a slow growing adenoma. 4. Suggest short-term outpatient PET-CT for further staging evaluation. 5. Aortic Atherosclerosis (ICD10-I70.0) and Emphysema (ICD10-J43.9). Electronically Signed   By: Ilona Sorrel M.D.   On: 12/23/2019 18:57   CT ABDOMEN PELVIS W CONTRAST  Result Date: 12/23/2019 CLINICAL DATA:  Inpatient. Right frontal lobe cerebral mass with vasogenic edema on head CT. Assess for primary neoplasm. EXAM: CT CHEST, ABDOMEN, AND PELVIS WITH CONTRAST TECHNIQUE: Multidetector CT imaging of the chest, abdomen and pelvis was performed following the standard protocol during bolus administration of intravenous contrast. CONTRAST:  167m OMNIPAQUE IOHEXOL 300 MG/ML  SOLN COMPARISON:  06/25/2011 chest CT angiogram and 06/26/2011 CT abdomen. FINDINGS: CT CHEST FINDINGS Cardiovascular: Normal heart size. No significant pericardial effusion/thickening. Two  lead left subclavian ICD is noted with lead tips in the right atrium and right ventricular apex. Left anterior descending coronary atherosclerosis. Atherosclerotic nonaneurysmal thoracic aorta. Normal caliber pulmonary arteries. No central pulmonary emboli. Mediastinum/Nodes: No discrete thyroid nodules. Unremarkable esophagus. No axillary adenopathy. Mildly enlarged 1.1 cm aortopulmonary node (series 3/image 23), new since 2012. No additional pathologically enlarged mediastinal nodes. No pathologically enlarged hilar nodes. Lungs/Pleura: No pneumothorax. No pleural effusion. Moderate centrilobular and paraseptal emphysema. No acute consolidative airspace disease or lung masses. Irregular spiculated posterior right middle lobe 1.5 x 0.7 cm pulmonary nodule abutting and distorting the right major fissure (series 5/image 97), which may represent growth of a 0.5 cm nodule seen in this location on 06/24/2011 chest CT. No additional significant pulmonary nodules. Parenchymal banding at the dependent left lung base compatible with  nonspecific postinfectious/postinflammatory scarring. Musculoskeletal: No aggressive appearing focal osseous lesions. Moderate symmetric bilateral gynecomastia is unchanged. CT ABDOMEN PELVIS FINDINGS Hepatobiliary: Normal liver with no liver mass. Cholecystectomy. No biliary ductal dilatation. Pancreas: Normal, with no mass or duct dilation. Spleen: Normal size. No mass. Adrenals/Urinary Tract: Right adrenal 3.2 cm nodule with density 45 HU minimally increased from 2.8 cm on 06/24/2011 CT. No left adrenal nodules. No hydronephrosis. Subcentimeter hypodense renal cortical lesion in the upper right kidney is too small to characterize and requires no follow-up. Scattered mild renal cortical scarring in the kidneys bilaterally. Normal bladder. Stomach/Bowel: Normal non-distended stomach. Normal caliber small bowel with no small bowel wall thickening. Appendix not discretely visualized. Normal large  bowel with no diverticulosis, large bowel wall thickening or pericolonic fat stranding. Vascular/Lymphatic: Atherosclerotic nonaneurysmal abdominal aorta. Patent portal, splenic, hepatic and renal veins. No pathologically enlarged lymph nodes in the abdomen or pelvis. Reproductive: Top-normal size prostate. Other: No pneumoperitoneum, ascites or focal fluid collection. Musculoskeletal: No aggressive appearing focal osseous lesions. Healed deformity in the lateral left iliac wing. Moderate degenerative disc disease at L5-S1 with unilateral right L5 pars defect. IMPRESSION: 1. Spiculated 1.5 x 0.7 cm posterior right middle lobe pulmonary nodule abutting and distorting the right major fissure, suspicious for primary bronchogenic carcinoma. 2. Solitary mildly enlarged aortopulmonary mediastinal lymph node. Metastatic disease not excluded. 3. Indeterminate right adrenal 3.2 cm nodule, mildly increased since 2012 CT, more likely a slow growing adenoma. 4. Suggest short-term outpatient PET-CT for further staging evaluation. 5. Aortic Atherosclerosis (ICD10-I70.0) and Emphysema (ICD10-J43.9). Electronically Signed   By: Ilona Sorrel M.D.   On: 12/23/2019 18:57   ECHOCARDIOGRAM COMPLETE  Result Date: 12/25/2019    ECHOCARDIOGRAM REPORT   Patient Name:   [employer] Date of Exam: 12/25/2019 Medical Rec #:  [id number]   Height:       72.0 in Accession #:    [id number]  Weight:       170.0 lb Date of Birth:  October 26, 1960    BSA:          1.988 m Patient Age:    49 years    BP:           123/85 mmHg Patient Gender: M           HR:           90 bpm. Exam Location:  Inpatient Procedure: 2D Echo, Color Doppler and Cardiac Doppler Indications:    I42.9 Cardiomyopathy (unspecified)  History:        Patient has prior history of Echocardiogram examinations, most                 recent 11/02/2018. CHF, Pacemaker and Defibrillator; Risk                 Factors:Hypertension, Diabetes and Dyslipidemia.  Sonographer:    Raquel Sarna Senior RDCS  Referring Phys: [place]  1. Left ventricular ejection fraction, by estimation, is 20 to 25%. The left ventricle has severely decreased function. The left ventricle demonstrates global hypokinesis. There is moderate asymmetric left ventricular hypertrophy of the inferolateral segment. Left ventricular diastolic parameters are indeterminate.  2. Right ventricular systolic function is normal. The right ventricular size is normal. Tricuspid regurgitation signal is inadequate for assessing PA pressure.  3. Left atrial size was moderately dilated.  4. The mitral valve is normal in structure. Mild mitral valve regurgitation. No evidence of mitral stenosis.  5. The aortic valve is tricuspid. Aortic valve regurgitation  is trivial. No aortic stenosis is present.  6. The inferior vena cava is dilated in size with <50% respiratory variability, suggesting right atrial pressure of 15 mmHg. FINDINGS  Left Ventricle: Left ventricular ejection fraction, by estimation, is 20 to 25%. The left ventricle has severely decreased function. The left ventricle demonstrates global hypokinesis. The left ventricular internal cavity size was normal in size. There is moderate asymmetric left ventricular hypertrophy of the inferolateral segment. Left ventricular diastolic parameters are indeterminate. Right Ventricle: The right ventricular size is normal. No increase in right ventricular wall thickness. Right ventricular systolic function is normal. Tricuspid regurgitation signal is inadequate for assessing PA pressure. Left Atrium: Left atrial size was moderately dilated. Right Atrium: Right atrial size was normal in size. Pericardium: There is no evidence of pericardial effusion. Mitral Valve: The mitral valve is normal in structure. Normal mobility of the mitral valve leaflets. Mild mitral valve regurgitation. No evidence of mitral valve stenosis. Tricuspid Valve: The tricuspid valve is normal in structure. Tricuspid  valve regurgitation is trivial. No evidence of tricuspid stenosis. Aortic Valve: The aortic valve is tricuspid. Aortic valve regurgitation is trivial. No aortic stenosis is present. Pulmonic Valve: The pulmonic valve was normal in structure. Pulmonic valve regurgitation is trivial. No evidence of pulmonic stenosis. Aorta: The aortic root is normal in size and structure. Venous: The inferior vena cava is dilated in size with less than 50% respiratory variability, suggesting right atrial pressure of 15 mmHg. IAS/Shunts: No atrial level shunt detected by color flow Doppler. Additional Comments: A pacer wire is visualized.  LEFT VENTRICLE PLAX 2D LVIDd:         6.80 cm LVIDs:         5.90 cm LV PW:         1.40 cm LV IVS:        0.90 cm LVOT diam:     2.47 cm LV SV:         62 LV SV Index:   31 LVOT Area:     4.79 cm  LV Volumes (MOD) LV vol d, MOD A2C: 216.0 ml LV vol d, MOD A4C: 220.0 ml LV vol s, MOD A2C: 164.0 ml LV vol s, MOD A4C: 179.0 ml LV SV MOD A2C:     52.0 ml LV SV MOD A4C:     220.0 ml LV SV MOD BP:      54.3 ml RIGHT VENTRICLE RV S prime:     12.60 cm/s TAPSE (M-mode): 2.1 cm LEFT ATRIUM             Index       RIGHT ATRIUM           Index LA diam:        3.50 cm 1.76 cm/m  RA Area:     18.80 cm LA Vol (A2C):   85.7 ml 43.10 ml/m RA Volume:   47.80 ml  24.04 ml/m LA Vol (A4C):   76.6 ml 38.52 ml/m LA Biplane Vol: 83.6 ml 42.04 ml/m  AORTIC VALVE LVOT Vmax:   70.50 cm/s LVOT Vmean:  51.300 cm/s LVOT VTI:    0.130 m  AORTA Ao Root diam: 3.50 cm Ao Asc diam:  3.30 cm  SHUNTS Systemic VTI:  0.13 m Systemic Diam: 2.47 cm Cherlynn Kaiser MD Electronically signed by Cherlynn Kaiser MD Signature Date/Time: 12/25/2019/1:27:41 PM    Final      Resolved Hospital Problem list   x  Assessment & Plan:  Right middle  lobe 1.5 cm spiculated nodule Underlying smoker Underlying radiologic emphysema  Right cerebral metastasis on CT scan head solitary with vasogenic edema 1.5 cm [unable to get MRI because of  pacemaker]  On chronic anticoagulation with CKD EF 20%  12/25/2019 - no real change overall. INR> 2  Plan - Vit k oral x 1 to facilitate ENB procedure sooner    -Meets indication for electro navigational bronchoscopy with endobronchial ultrasound for tissue diagnosis of very likely non-small cell lung cancer  -Risks of sedation, hemothorax pneumothorax all explained. Patient wants to proceed ASAP  -Risk of nondiagnosis also explained. In this case we might have to resort to substantiating the clinical pretest probably of malignancy with the help of PET scan and also BIODESIK plasma sample   -Discussed with Dr. June Leap of navigational bronchoscopy wrist: He will evaluate for a date on December 26, 2019. Patient will be scheduled sometime during the week. Unclear when at this point   -Meanwhile he requested cardiac evaluation of anesthesia risk -discussed Dr. Caryl Comes who will see the patient   -await  pulmonary function testing   -Get ABG 3/14 - order pending   - recheck INR 12/26/19 - hold any hpearin till INR better    - procedure likely 12/27/2019 but will make NPO after 3am 12/26/19 in case he gets called for 12/26/19   Best practice:  D/w patient and Dr Ouida Sills of [facility]   SIGNATURE    Dr. Brand Males, M.D., F.C.C.P,  Pulmonary and Critical Care Medicine Staff Physician, [place] Director - Interstitial Lung Disease  Program  [facility] [place] [facility] [place], [place], [address]  Pager: [phone], If no answer or between  15:00h - 7:00h: call [phone] Telephone: [phone]  2:14 PM 12/25/2019

## 2019-12-25 NOTE — Progress Notes (Signed)
ANTICOAGULATION CONSULT NOTE - Follow-Up Consult  Pharmacy Consult for heparin  Indication: Left ventricular noncompaction  No Known Allergies  Patient Measurements: Height: 6' (182.9 cm) Weight: 170 lb (77.1 kg) IBW/kg (Calculated) : 77.6  Vital Signs: Temp: 98 F (36.7 C) (03/14 0420) Temp Source: Oral (03/14 0420) BP: 122/73 (03/14 0420) Pulse Rate: 80 (03/14 0420)  Labs: Recent Labs    12/23/19 1056 12/23/19 1056 12/23/19 1132 12/23/19 1132 12/24/19 0411 12/25/19 0421  HGB 13.1   < > 14.3   < > 12.7* 11.5*  HCT 42.8   < > 42.0  --  40.5 36.0*  PLT 280  --   --   --  288 277  APTT 45*  --   --   --   --   --   LABPROT 25.9*  --   --   --  28.5* 29.4*  INR 2.4*  --   --   --  2.7* 2.8*  CREATININE 1.64*   < > 1.70*  --  1.60* 1.39*   < > = values in this interval not displayed.    Estimated Creatinine Clearance: 63.9 mL/min (A) (by C-G formula based on SCr of 1.39 mg/dL (H)).  Assessment: 59 YO M presenting with L sided weakness.  CT head showed new cerebral mass, concern for malignancy.  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 Warfarin on hold for bronch when INR < 1.5. Pharmacy consulted to begin heparin drip once INR <2.  INR is therapeutic at 2.8 this morning so will not start heparin drip. No bleeding noted, Hgb down 11.5, platelets are normal.   PTA warfarin dosing: 3.75mg  MWF, 7.5mg  all other days  Goal of Therapy:  Heparin level 0.3-0.5 units/ml Monitor platelets by anticoagulation protocol: Yes   Plan:  Warfarin on hold Begin heparin drip when INR < 2 Daily INR for now  Thank you for involving pharmacy in this patient's care.  Renold Genta, PharmD, BCPS Clinical Pharmacist Clinical phone for 12/25/2019 until 3p is [phone] 12/25/2019 8:21 AM  **Pharmacist phone directory can be found on [email or web address] listed under [place]**

## 2019-12-25 NOTE — Evaluation (Signed)
Occupational Therapy Evaluation Patient Details Name: Dennis [name]. MRN: [id number] DOB: 1960/10/17 Today's Date: 12/25/2019    History of Present Illness Dennis Nassim Cosma. is a 59 y.o. male presenting with acute onset left-sided weakness. PMH is significant for type 2 diabetes, CHF with implantable defibrillator, tobacco use, warfarin use. 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 Per chartProbably metastatic lung cancer to brain   Clinical Impression   This 59 yo male admitted with above presents to acute OT with PLOF of totally independent with basic ADLs, IADLs, driving, and working. Currently he is at S level due to left sided weakness (he reports better today than yesterday). He will benefit from acute OT with follow up OPOT. I recommended to him that he should not drive due to weakness in his LUE.    Follow Up Recommendations  Outpatient OT    Equipment Recommendations  None recommended by OT       Precautions / Restrictions Precautions Precautions: Fall Restrictions Weight Bearing Restrictions: No      Mobility Bed Mobility Overal bed mobility: Independent                Transfers Overall transfer level: Needs assistance   Transfers: Sit to/from Stand Sit to Stand: Supervision              Balance Overall balance assessment: Needs assistance Sitting-balance support: No upper extremity supported;Feet supported Sitting balance-Leahy Scale: Good     Standing balance support: No upper extremity supported   Standing balance comment: static standing normal, dynamic testing pt had diffculty with single limb stance LLE and walking on toes. He did well walking on heels and bending over to pick item up off of floor                           ADL either performed or assessed with clinical judgement   ADL Overall ADL's : Needs  assistance/impaired Eating/Feeding: Modified independent Eating/Feeding Details (indicate cue type and reason): can use LUE functionally but has to think through using it more for Bil manual tasks Grooming: Standing;Supervision/safety Grooming Details (indicate cue type and reason): can use LUE functionally but has to think through using it more for Bil manual tasks Upper Body Bathing: Sitting;Supervision/ safety Upper Body Bathing Details (indicate cue type and reason): can use LUE functionally but has to think through using it more for Bil manual tasks Lower Body Bathing: Supervison/ safety Lower Body Bathing Details (indicate cue type and reason): S sit<>stand,can use LUE functionally but has to think through using it more for Bil manual tasks Upper Body Dressing : Supervision/safety;Sitting Upper Body Dressing Details (indicate cue type and reason): can use LUE functionally but has to think through using it more for Bil manual tasks Lower Body Dressing: Supervision/safety;Sit to/from stand Lower Body Dressing Details (indicate cue type and reason): can use LUE functionally but has to think through using it more for Bil manual tasks Toilet Transfer: Supervision/safety;Ambulation;Regular Toilet   Toileting- Water quality scientist and Hygiene: Supervision/safety;Sit to/from stand   Tub/ Shower Transfer: Tub transfer;Supervision/safety;Ambulation Tub/Shower Transfer Details (indicate cue type and reason): stepping into and out of tub (holding onto wall to step in, but not to step out--recommended to him that he does hold onto something when stepping out)         Vision Patient Visual Report: No change from baseline  Pertinent Vitals/Pain Pain Assessment: No/denies pain     Hand Dominance Right   Extremity/Trunk Assessment Upper Extremity Assessment Upper Extremity Assessment: LUE deficits/detail LUE Deficits / Details: Grossly 3+/5 with weakness in shoulder flexion,  elbow extension, forearm supination and grip LUE Coordination: decreased fine motor;decreased gross motor           Communication Communication Communication: No difficulties   Cognition Arousal/Alertness: Awake/alert Behavior During Therapy: WFL for tasks assessed/performed Overall Cognitive Status: Within Functional Limits for tasks assessed                                                Home Living Family/patient expects to be discharged to:: Private residence Living Arrangements: Alone Available Help at Discharge: Family;Available PRN/intermittently Type of Home: House Home Access: Stairs to enter [employer] of Steps: 6   Home Layout: One level     Bathroom Shower/Tub: Corporate investment banker: Standard     Home Equipment: None          Prior Functioning/Environment Level of Independence: Independent        Comments: 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        OT Problem List: Decreased strength;Impaired balance (sitting and/or standing)      OT Treatment/Interventions: Therapeutic exercise;Patient/family education    OT Goals(Current goals can be found in the care plan section) Acute Rehab OT Goals Patient Stated Goal: to go home and get back to work OT Goal Formulation: With patient Time For Goal Achievement: 01/08/20 Potential to Achieve Goals: Good  OT Frequency: Min 2X/week           Co-evaluation PT/OT/SLP Co-Evaluation/Treatment: Yes Reason for Co-Treatment: For patient/therapist safety PT goals addressed during session: Mobility/safety with mobility;Balance OT goals addressed during session: ADL's and self-care      AM-PAC OT "6 Clicks" Daily Activity     Outcome Measure Help from another person eating meals?: None Help from another person taking care of personal grooming?: A Little Help from another person toileting, which includes using toliet, bedpan, or urinal?: A  Little Help from another person bathing (including washing, rinsing, drying)?: A Little Help from another person to put on and taking off regular upper body clothing?: A Little Help from another person to put on and taking off regular lower body clothing?: A Little 6 Click Score: 19   End of Session Equipment Utilized During Treatment: Gait belt  Activity Tolerance: Patient tolerated treatment well Patient left: in chair;with call bell/phone within reach;with chair alarm set  OT Visit Diagnosis: Other abnormalities of gait and mobility (R26.89);Muscle weakness (generalized) (M62.81)                Time: 9983-3825 OT Time Calculation (min): 21 min Charges:  OT General Charges $OT Visit: 1 Visit OT Evaluation $OT Eval Moderate Complexity: 1 Mod  CathyOTR/L Acute Rehab Services Pager [phone] Office [phone]   12/25/2019, 12:02 PM

## 2019-12-26 ENCOUNTER — Ambulatory Visit
Admit: 2019-12-26 | Discharge: 2019-12-26 | Disposition: A | Discharge status: Home / Self Care | Payer: Medicare Other | Source: Ambulatory Visit | Attending: Radiation Oncology | Admitting: Radiation Oncology

## 2019-12-26 ENCOUNTER — Encounter (HOSPITAL_COMMUNITY): Payer: Self-pay | Admitting: Family Medicine

## 2019-12-26 ENCOUNTER — Other Ambulatory Visit: Payer: Self-pay | Admitting: Radiation Oncology

## 2019-12-26 ENCOUNTER — Inpatient Hospital Stay (HOSPITAL_COMMUNITY): Payer: Medicare Other

## 2019-12-26 ENCOUNTER — Ambulatory Visit: Payer: Medicare Other

## 2019-12-26 DIAGNOSIS — C7931 Secondary malignant neoplasm of brain: Secondary | ICD-10-CM | POA: Insufficient documentation

## 2019-12-26 DIAGNOSIS — Z510 Encounter for antineoplastic radiation therapy: Secondary | ICD-10-CM | POA: Insufficient documentation

## 2019-12-26 DIAGNOSIS — G9389 Other specified disorders of brain: Secondary | ICD-10-CM

## 2019-12-26 DIAGNOSIS — G8194 Hemiplegia, unspecified affecting left nondominant side: Secondary | ICD-10-CM

## 2019-12-26 LAB — PULMONARY FUNCTION TEST
DL/VA % pred: 65 %
DL/VA: 2.8 ml/min/mmHg/L
DLCO cor % pred: 43 %
DLCO cor: 12.92 ml/min/mmHg
DLCO unc % pred: 38 %
DLCO unc: 11.63 ml/min/mmHg
FEF 25-75 Pre: 1.81 L/sec
FEF2575-%Pred-Pre: 56 %
FEV1-%Pred-Pre: 69 %
FEV1-Pre: 2.4 L
FEV1FVC-%Pred-Pre: 90 %
FEV6-%Pred-Pre: 77 %
FEV6-Pre: 3.29 L
FEV6FVC-%Pred-Pre: 100 %
FVC-%Pred-Pre: 77 %
FVC-Pre: 3.38 L
Pre FEV1/FVC ratio: 71 %
Pre FEV6/FVC Ratio: 97 %

## 2019-12-26 LAB — GLUCOSE, CAPILLARY
Glucose-Capillary: 197 mg/dL — ABNORMAL HIGH (ref 70–99)
Glucose-Capillary: 120 mg/dL — ABNORMAL HIGH (ref 70–99)
Glucose-Capillary: 69 mg/dL — ABNORMAL LOW (ref 70–99)
Glucose-Capillary: 273 mg/dL — ABNORMAL HIGH (ref 70–99)
Glucose-Capillary: 234 mg/dL — ABNORMAL HIGH (ref 70–99)

## 2019-12-26 LAB — PROTIME-INR
INR: 1.6 — ABNORMAL HIGH (ref 0.8–1.2)
Prothrombin Time: 19.3 seconds — ABNORMAL HIGH (ref 11.4–15.2)

## 2019-12-26 MED ORDER — DEXAMETHASONE 4 MG PO TABS
4.0000 mg | ORAL_TABLET | Freq: Two times a day (BID) | ORAL | Status: DC
  Administered 2019-12-26 – 2019-12-28 (×4): 4 mg via ORAL
  Filled 2019-12-26 (×4): qty 1

## 2019-12-26 MED ORDER — DEXAMETHASONE 4 MG PO TABS: 4.0000 mg | ORAL_TABLET | Freq: Two times a day (BID) | ORAL | 0 refills | Status: DC

## 2019-12-26 MED ORDER — PANTOPRAZOLE SODIUM 40 MG PO TBEC
40.0000 mg | DELAYED_RELEASE_TABLET | Freq: Every day | ORAL | Status: DC
  Administered 2019-12-27: 40 mg via ORAL
  Filled 2019-12-26: qty 1

## 2019-12-26 NOTE — Progress Notes (Signed)
PT Cancellation Note  Patient Details Name: Dennis [name]. MRN: [id number] DOB: [birth year]   Cancelled Treatment:    Reason Eval/Treat Not Completed: Patient declined, no reason specified. Pt on phone with another provider upon arrival of PT, asked PT to return at a later time to therapy session. PT will continue to follow and treat as time/schedule allow.   Hardie Pulley, DPT   Acute Rehabilitation Department Pager #: [phone]   Otho Bellows 12/26/2019, 4:17 PM

## 2019-12-26 NOTE — Progress Notes (Addendum)
Inpatient Diabetes Program Recommendations  AACE/ADA: New Consensus Statement on Inpatient Glycemic Control   Target Ranges:  Prepandial:   less than 140 mg/dL      Peak postprandial:   less than 180 mg/dL (1-2 hours)      Critically ill patients:  140 - 180 mg/dL   Results for EVERARDO, VORIS (MRN [id number]) as of 12/26/2019 08:25  Ref. Range 12/25/2019 06:05 12/25/2019 11:25 12/25/2019 16:41 12/25/2019 21:37 12/26/2019 06:08  Glucose-Capillary Latest Ref Range: 70 - 99 mg/dL 151 (H) 246 (H) 277 (H) 402 (H) 197 (H)   Review of Glycemic Control  Diabetes history: DM2 Outpatient Diabetes medications: Lantus 32 units QHS, Jardiance 10 mg QAM, Metformin 500 mg BID Current orders for Inpatient glycemic control: Lantus 25 units QHS, Novolog 0-9 units TID with meals, Novolog 0-5 units QHS, Invokana 100 mg QAM; Decadron 6 mg BID  Inpatient Diabetes Program Recommendations:   Insulin-Meal Coverage: If steroids are continued, please consider ordering Novolog 5 units TID with meals for meal coverage if patient eats at least 50% of meals.  Insulin-correction: Please consider increasing Novolog to moderate scale (0-15 units TID with meals).  Addendum 12/26/19'@10' :30-Spoke with patient about diabetes and home regimen for diabetes control. Patient reports being followed by PCP for diabetes management and currently taking Lantus 32 units QHS, Jardiance 10 mg QAM, and Metformin 500 mg BID as an outpatient for diabetes control. Patient reports taking DM medications as prescribed and notes he has not seen PCP since COVID pandemic started. Patient reports he does not check glucose at home and notes that he will need a prescription for a glucometer and testing supplies.  Discussed A1C results (10% on 12/24/19 ) and explained that current A1C indicates an average glucose of 240 mg/dl over the past 2-3 months. Discussed glucose and A1C goals. Discussed importance of checking CBGs and maintaining good CBG control to  prevent long-term and short-term complications. Explained how hyperglycemia leads to damage within blood vessels which lead to the common complications seen with uncontrolled diabetes. Stressed to the patient the importance of improving glycemic control to prevent further complications from uncontrolled diabetes. Discussed impact of nutrition, exercise, stress, sickness, and medications on diabetes control. Patient states that he just recently started going back to the [facility]; encouraged patient to continue exercising which will help improve DM control. Patient admits that he never took DM serious in the past but he now knows how important it is and he plans to start checking glucose and getting DM under control.  Patient verbalized understanding of information discussed and reports no further questions at this time related to diabetes. At time of discharge please provide Rx for: glucose monitoring kit (#[id number]).  Thanks, Barnie Alderman, RN, MSN, CDE Diabetes Coordinator Inpatient Diabetes Program [phone] (Team Pager from 8am to 5pm)

## 2019-12-26 NOTE — Consult Note (Addendum)
Radiation Oncology         [phone]) [phone] ________________________________  Name: Dennis [name].        MRN: [id number]  Date of Service: 12/26/19 DOB: [birth year]  NL:[id number], Pcp Not In  No ref. provider found     REFERRING PHYSICIAN: No ref. provider found   DIAGNOSIS: The encounter diagnosis was Brain mass.   HISTORY OF PRESENT ILLNESS: Dennis [name]. is a 59 y.o. male seen at the request of Dr. Burr Medico for a newly noted solitary brain metastasis undergoing a work up for lung cancer. The patient apparently presented with confusion, word finding difficulties, gait and balance difficulties. An evaluation by CT in the ED showed a 1.4 x 1.5 x 1.5 cm mass in the posterior frontal lobe on the right side with surrounding vasogenic edema and partial effacement of the sulci. No midline shift was noted, and remote infarcts involving the right insular cortex, anterior frontal lobe, and precentral gyrus. CT imaging of the CAP also on 12/23/19 revealed two lead left subclavian IVCD, and a mildly enlarged 1.1. cm aortopulmonary node, no other enlarged mediastinal or hilar nodes, and a 1.5 x .7 cm spiculated posterior RML nodule abutting and distorting the right major fissure which may have represented a 5 mm nodule seen in 2012. There was also a 3.2 cm nodule in the right adrenal gland, also increased since 2012 when it wad been 2.8 cm. He has a multifactorial cardiac history and has been seen by cardiology to undergo bronchoscopy. He's contacted today to discuss options of radiotherapy to the brain once we establish a diagnosis, as the working diagnosis is a likely lung cancer.   PREVIOUS RADIATION THERAPY: No   PAST MEDICAL HISTORY:  Past Medical History:  Diagnosis Date   Alcohol abuse with alcohol-induced mood disorder ([place]) 08/18/2015   Automatic implantable cardioverter-defibrillator in situ 2012   S/P St Jude Dual Chamber. ICD.   CAD (coronary artery disease) 11/16/2014   CAD Coronary angiography  (12/2008) with mid LAD totally occluded and collateralized.   Cardiac LV ejection fraction 10-20%    Chest pain 09/04/2018   CHF (congestive heart failure) (HCC)    Chronic systolic heart failure ([place])    a) Mixed ICM/NICM b) RHC (05/2014): RA 2, RV 19/2/3, PA 22/14 (18), PCWP 6, Fick CO/CI: 5.2 / 2.7, PVR 2.3 WU, PA 60% and 64% c) ECHO (05/2014): EF 20-25%, diff HK, akinesis entireanteroseptal myocardium, triv AI, mod MR, LA mod/sev dilated   Cocaine abuse ([place]) 01/24/2015   Cocaine abuse with cocaine-induced mood disorder ([place]) 08/20/2015   Drug abuse and dependence ([place])    Dysphagia following cerebral infarction 1/93/7902   Embolic stroke involving right middle cerebral artery (HCC)    H/O noncompliance with medical treatment, presenting hazards to health 01/24/2015   Heart murmur    High cholesterol    Ischemic cardiomyopathy    a) Coronary angiography (12/2008) at [facility]: [place]: nl, LAD mid 100% stenosis with left to left and right-to-left collaterals to the distal LAD; Lcx: nl, RCA nl.     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[place])    MDD (major depressive disorder), recurrent severe, without psychosis ([place]) 08/18/2015   Open wound of foot 02/27/2015   Pneumonia 1988   Psychoactive substance-induced mood disorder ([place]) 07/17/2015   Sleep apnea    "cleared after T&A"   Status post PICC central line placement    Stroke [facility])    Substance induced mood disorder ([place]) 08/17/2015   Type II diabetes mellitus (  [place])        PAST SURGICAL HISTORY: Past Surgical History:  Procedure Laterality Date   CARDIAC CATHETERIZATION  05/2014   CARDIAC DEFIBRILLATOR PLACEMENT  03/2011   CHOLECYSTECTOMY  1994   FOREARM FRACTURE SURGERY Left 1980   FRACTURE SURGERY     RIGHT HEART CATHETERIZATION N/A 05/30/2014   Procedure: RIGHT HEART CATH;  Surgeon: Jolaine Artist, MD;  Location: [facility] CATH LAB;  Service: Cardiovascular;  Laterality: N/A;   RIGHT HEART CATHETERIZATION N/A 02/07/2015    Procedure: RIGHT HEART CATH;  Surgeon: Jolaine Artist, MD;  Location: [facility] CATH LAB;  Service: Cardiovascular;  Laterality: N/A;   TONSILLECTOMY AND ADENOIDECTOMY  ~ 1998     FAMILY HISTORY:  Family History  Problem Relation Age of Onset   Diabetes Mother    Heart disease Mother    Diabetes Father    Prostate cancer Father    Heart disease Father    Diabetes Brother    Diabetes Brother      SOCIAL HISTORY:  reports that he has been smoking cigarettes. He has a 7.75 pack-year smoking history. He has never used smokeless tobacco. He reports current alcohol use. He reports previous drug use. Drug: Cocaine.  The patient is single. He is originally from [place] and uses his mother's address for all of his documentation, his brother lives in [place] as does his daughter, and he feels well supported by his family. He currently resides in [place], but has medical providers in [place] and uses [facility]. 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  ALLERGIES: Patient has no known allergies.   MEDICATIONS:  Current Facility-Administered Medications  Medication Dose Route Frequency Provider Last Rate Last Admin   atorvastatin (LIPITOR) tablet 20 mg  20 mg Oral q1800 Anderson, Chelsey L, DO   20 mg at 12/25/19 1707   canagliflozin (INVOKANA) tablet 100 mg  100 mg Oral QAC breakfast Ouida Sills, Chelsey L, DO   100 mg at 12/26/19 9528   carvedilol (COREG) tablet 9.375 mg  9.375 mg Oral BID WC Gladys Damme, MD   9.375 mg at 12/26/19 0958   dexamethasone (DECADRON) tablet 4 mg  4 mg Oral BID PC Hayden Pedro, PA-C       insulin aspart (novoLOG) injection 0-5 Units  0-5 Units Subcutaneous QHS Gladys Damme, MD   5 Units at 12/25/19 2158   insulin aspart (novoLOG) injection 0-9 Units  0-9 Units Subcutaneous TID [facility] Gladys Damme, MD   2 Units at 12/26/19 4132   insulin glargine (LANTUS) injection 25  Units  25 Units Subcutaneous Q2200 Anderson, Chelsey L, DO   25 Units at 12/25/19 2159   [START ON 12/27/2019] pantoprazole (PROTONIX) EC tablet 40 mg  40 mg Oral Q0600 Croitoru, Mihai, MD         REVIEW OF SYSTEMS: On review of systems, the patient reports that he is doing well overall. He feels like he could participate in the "Olympics." He actually states that his lower extremity continues to improve and he is unable to walk without weakness. He describes in retrospect a few weeks of intermittent headaches, and one evening of having nausea. He states that he has woken up with a headache on one occasion as well. Since taking steroids this has not returned. Denies any chest pain, shortness of breath, cough, fevers, chills, night sweats, unintended weight changes. He denies any bowel or bladder disturbances, and denies abdominal pain, nausea  or vomiting. He denies any new musculoskeletal or joint aches or pains. A complete review of systems is obtained and is otherwise negative.     PHYSICAL EXAM:  Wt Readings from Last 3 Encounters:  12/23/19 170 lb (77.1 kg)  10/07/19 170 lb (77.1 kg)  08/05/19 163 lb 6.4 oz (74.1 kg)   Temp Readings from Last 3 Encounters:  12/26/19 97.7 F (36.5 C) (Oral)  10/07/19 98 F (36.7 C) (Oral)  09/05/18 98.9 F (37.2 C) (Oral)   BP Readings from Last 3 Encounters:  12/26/19 111/85  10/07/19 134/90  08/05/19 109/74   Pulse Readings from Last 3 Encounters:  12/26/19 79  10/07/19 98  08/05/19 90   Pain Assessment Pain Score: 0-No pain/10  Unable to assess due to encounter type.   ECOG = 1  0 - Asymptomatic (Fully active, able to carry on all predisease activities without restriction)  1 - Symptomatic but completely ambulatory (Restricted in physically strenuous activity but ambulatory and able to carry out work of a light or sedentary nature. For example, light housework, office work)  2 - Symptomatic, <50% in bed during the day (Ambulatory and  capable of all self care but unable to carry out any work activities. Up and about more than 50% of waking hours)  3 - Symptomatic, >50% in bed, but not bedbound (Capable of only limited self-care, confined to bed or chair 50% or more of waking hours)  4 - Bedbound (Completely disabled. Cannot carry on any self-care. Totally confined to bed or chair)  5 - Death   Eustace Pen MM, Creech RH, Tormey DC, et al. [phone]). "Toxicity and response criteria of the [facility]". [place] Oncol. 5 (6): 649-55    LABORATORY DATA:  Lab Results  Component Value Date   WBC 14.3 (H) 12/25/2019   HGB 11.5 (L) 12/25/2019   HCT 36.0 (L) 12/25/2019   MCV 93.3 12/25/2019   PLT 277 12/25/2019   Lab Results  Component Value Date   NA 138 12/25/2019   K 4.2 12/25/2019   CL 107 12/25/2019   CO2 24 12/25/2019   Lab Results  Component Value Date   ALT 13 12/23/2019   AST 18 12/23/2019   ALKPHOS 96 12/23/2019   BILITOT 1.0 12/23/2019      RADIOGRAPHY: CT HEAD WO CONTRAST  Result Date: 12/23/2019 CLINICAL DATA:  Left arm weakness since Monday or Tuesday. Fall on Wednesday night. EXAM: CT HEAD WITHOUT CONTRAST TECHNIQUE: Contiguous axial images were obtained from the base of the skull through the vertex without intravenous contrast. COMPARISON:  01/24/2015 FINDINGS: Brain: 14 mm dense intra-axial mass with moderate vasogenic edema in the high right frontal lobe. Remote infarct in the right frontal operculum and insula. No acute hemorrhage or hydrocephalus. No acute infarct. Vascular: No hyperdense vessel. Skull: No evidence of fracture or bone lesion Sinuses/Orbits: Negative IMPRESSION: 1. 14 mm right frontal mass with vasogenic edema. Recommend brain MRI with contrast. 2. Chronic right insular and opercular infarct. Electronically Signed   By: Monte Fantasia M.D.   On: 12/23/2019 11:16   CT HEAD W CONTRAST  Result Date: 12/23/2019 CLINICAL DATA:  Cerebral mass. EXAM: CT HEAD WITH  CONTRAST TECHNIQUE: Contiguous axial images were obtained from the base of the skull through the vertex with intravenous contrast. CONTRAST:  147mL OMNIPAQUE IOHEXOL 300 MG/ML  SOLN COMPARISON:  CT head without contrast 12/23/2019 FINDINGS: Brain: Postcontrast images confirm a homogeneously enhancing mass lesion in the posterior right  frontal lobe measuring 1.4 x 1.5 x 1.5 cm. Surrounding vasogenic edema is present with partial effacement of sulci. No significant midline shift is present. No other foci of enhancement are present. Remote infarcts involving the right insular cortex as well as the anterior frontal lobe and precentral gyrus are again noted. The brainstem and cerebellum are within normal limits. The left hemisphere is unremarkable. Vascular: Contrast is present in the major vascular structures. Skull: Calvarium is intact. No focal lytic or blastic lesions are present. No significant extracranial soft tissue lesion is present. Sinuses/Orbits: The paranasal sinuses and mastoid air cells are clear. The globes and orbits are within normal limits. IMPRESSION: 1. 1.4 x 1.5 x 1.5 cm homogeneously enhancing mass lesion in the posterior right frontal lobe with surrounding vasogenic edema and partial effacement of sulci. This most likely represents a solitary metastasis. Primary brain tumor is not excluded. 2. No significant midline shift. 3. Remote infarcts involving the right insular cortex and anterior frontal lobe and precentral gyrus. Electronically Signed   By: San Morelle M.D.   On: 12/23/2019 19:12   CT CHEST W CONTRAST  Result Date: 12/23/2019 CLINICAL DATA:  Inpatient. Right frontal lobe cerebral mass with vasogenic edema on head CT. Assess for primary neoplasm. EXAM: CT CHEST, ABDOMEN, AND PELVIS WITH CONTRAST TECHNIQUE: Multidetector CT imaging of the chest, abdomen and pelvis was performed following the standard protocol during bolus administration of intravenous contrast. CONTRAST:   115mL OMNIPAQUE IOHEXOL 300 MG/ML  SOLN COMPARISON:  06/25/2011 chest CT angiogram and 06/26/2011 CT abdomen. FINDINGS: CT CHEST FINDINGS Cardiovascular: Normal heart size. No significant pericardial effusion/thickening. Two lead left subclavian ICD is noted with lead tips in the right atrium and right ventricular apex. Left anterior descending coronary atherosclerosis. Atherosclerotic nonaneurysmal thoracic aorta. Normal caliber pulmonary arteries. No central pulmonary emboli. Mediastinum/Nodes: No discrete thyroid nodules. Unremarkable esophagus. No axillary adenopathy. Mildly enlarged 1.1 cm aortopulmonary node (series 3/image 23), new since 2012. No additional pathologically enlarged mediastinal nodes. No pathologically enlarged hilar nodes. Lungs/Pleura: No pneumothorax. No pleural effusion. Moderate centrilobular and paraseptal emphysema. No acute consolidative airspace disease or lung masses. Irregular spiculated posterior right middle lobe 1.5 x 0.7 cm pulmonary nodule abutting and distorting the right major fissure (series 5/image 97), which may represent growth of a 0.5 cm nodule seen in this location on 06/24/2011 chest CT. No additional significant pulmonary nodules. Parenchymal banding at the dependent left lung base compatible with nonspecific postinfectious/postinflammatory scarring. Musculoskeletal: No aggressive appearing focal osseous lesions. Moderate symmetric bilateral gynecomastia is unchanged. CT ABDOMEN PELVIS FINDINGS Hepatobiliary: Normal liver with no liver mass. Cholecystectomy. No biliary ductal dilatation. Pancreas: Normal, with no mass or duct dilation. Spleen: Normal size. No mass. Adrenals/Urinary Tract: Right adrenal 3.2 cm nodule with density 45 HU minimally increased from 2.8 cm on 06/24/2011 CT. No left adrenal nodules. No hydronephrosis. Subcentimeter hypodense renal cortical lesion in the upper right kidney is too small to characterize and requires no follow-up. Scattered mild  renal cortical scarring in the kidneys bilaterally. Normal bladder. Stomach/Bowel: Normal non-distended stomach. Normal caliber small bowel with no small bowel wall thickening. Appendix not discretely visualized. Normal large bowel with no diverticulosis, large bowel wall thickening or pericolonic fat stranding. Vascular/Lymphatic: Atherosclerotic nonaneurysmal abdominal aorta. Patent portal, splenic, hepatic and renal veins. No pathologically enlarged lymph nodes in the abdomen or pelvis. Reproductive: Top-normal size prostate. Other: No pneumoperitoneum, ascites or focal fluid collection. Musculoskeletal: No aggressive appearing focal osseous lesions. Healed deformity in the lateral left iliac wing.  Moderate degenerative disc disease at L5-S1 with unilateral right L5 pars defect. IMPRESSION: 1. Spiculated 1.5 x 0.7 cm posterior right middle lobe pulmonary nodule abutting and distorting the right major fissure, suspicious for primary bronchogenic carcinoma. 2. Solitary mildly enlarged aortopulmonary mediastinal lymph node. Metastatic disease not excluded. 3. Indeterminate right adrenal 3.2 cm nodule, mildly increased since 2012 CT, more likely a slow growing adenoma. 4. Suggest short-term outpatient PET-CT for further staging evaluation. 5. Aortic Atherosclerosis (ICD10-I70.0) and Emphysema (ICD10-J43.9). Electronically Signed   By: Ilona Sorrel M.D.   On: 12/23/2019 18:57   CT ABDOMEN PELVIS W CONTRAST  Result Date: 12/23/2019 CLINICAL DATA:  Inpatient. Right frontal lobe cerebral mass with vasogenic edema on head CT. Assess for primary neoplasm. EXAM: CT CHEST, ABDOMEN, AND PELVIS WITH CONTRAST TECHNIQUE: Multidetector CT imaging of the chest, abdomen and pelvis was performed following the standard protocol during bolus administration of intravenous contrast. CONTRAST:  119mL OMNIPAQUE IOHEXOL 300 MG/ML  SOLN COMPARISON:  06/25/2011 chest CT angiogram and 06/26/2011 CT abdomen. FINDINGS: CT CHEST FINDINGS  Cardiovascular: Normal heart size. No significant pericardial effusion/thickening. Two lead left subclavian ICD is noted with lead tips in the right atrium and right ventricular apex. Left anterior descending coronary atherosclerosis. Atherosclerotic nonaneurysmal thoracic aorta. Normal caliber pulmonary arteries. No central pulmonary emboli. Mediastinum/Nodes: No discrete thyroid nodules. Unremarkable esophagus. No axillary adenopathy. Mildly enlarged 1.1 cm aortopulmonary node (series 3/image 23), new since 2012. No additional pathologically enlarged mediastinal nodes. No pathologically enlarged hilar nodes. Lungs/Pleura: No pneumothorax. No pleural effusion. Moderate centrilobular and paraseptal emphysema. No acute consolidative airspace disease or lung masses. Irregular spiculated posterior right middle lobe 1.5 x 0.7 cm pulmonary nodule abutting and distorting the right major fissure (series 5/image 97), which may represent growth of a 0.5 cm nodule seen in this location on 06/24/2011 chest CT. No additional significant pulmonary nodules. Parenchymal banding at the dependent left lung base compatible with nonspecific postinfectious/postinflammatory scarring. Musculoskeletal: No aggressive appearing focal osseous lesions. Moderate symmetric bilateral gynecomastia is unchanged. CT ABDOMEN PELVIS FINDINGS Hepatobiliary: Normal liver with no liver mass. Cholecystectomy. No biliary ductal dilatation. Pancreas: Normal, with no mass or duct dilation. Spleen: Normal size. No mass. Adrenals/Urinary Tract: Right adrenal 3.2 cm nodule with density 45 HU minimally increased from 2.8 cm on 06/24/2011 CT. No left adrenal nodules. No hydronephrosis. Subcentimeter hypodense renal cortical lesion in the upper right kidney is too small to characterize and requires no follow-up. Scattered mild renal cortical scarring in the kidneys bilaterally. Normal bladder. Stomach/Bowel: Normal non-distended stomach. Normal caliber small  bowel with no small bowel wall thickening. Appendix not discretely visualized. Normal large bowel with no diverticulosis, large bowel wall thickening or pericolonic fat stranding. Vascular/Lymphatic: Atherosclerotic nonaneurysmal abdominal aorta. Patent portal, splenic, hepatic and renal veins. No pathologically enlarged lymph nodes in the abdomen or pelvis. Reproductive: Top-normal size prostate. Other: No pneumoperitoneum, ascites or focal fluid collection. Musculoskeletal: No aggressive appearing focal osseous lesions. Healed deformity in the lateral left iliac wing. Moderate degenerative disc disease at L5-S1 with unilateral right L5 pars defect. IMPRESSION: 1. Spiculated 1.5 x 0.7 cm posterior right middle lobe pulmonary nodule abutting and distorting the right major fissure, suspicious for primary bronchogenic carcinoma. 2. Solitary mildly enlarged aortopulmonary mediastinal lymph node. Metastatic disease not excluded. 3. Indeterminate right adrenal 3.2 cm nodule, mildly increased since 2012 CT, more likely a slow growing adenoma. 4. Suggest short-term outpatient PET-CT for further staging evaluation. 5. Aortic Atherosclerosis (ICD10-I70.0) and Emphysema (ICD10-J43.9). Electronically Signed   By:  Ilona Sorrel M.D.   On: 12/23/2019 18:57   ECHOCARDIOGRAM COMPLETE  Result Date: 12/25/2019    ECHOCARDIOGRAM REPORT   Patient Name:   [facility] Date of Exam: 12/25/2019 Medical Rec #:  [id number]   Height:       72.0 in Accession #:    [id number]  Weight:       170.0 lb Date of Birth:  1961/04/29    BSA:          1.988 m Patient Age:    54 years    BP:           123/85 mmHg Patient Gender: M           HR:           90 bpm. Exam Location:  Inpatient Procedure: 2D Echo, Color Doppler and Cardiac Doppler Indications:    I42.9 Cardiomyopathy (unspecified)  History:        Patient has prior history of Echocardiogram examinations, most                 recent 11/02/2018. CHF, Pacemaker and Defibrillator; Risk                  Factors:Hypertension, Diabetes and Dyslipidemia.  Sonographer:    Raquel Sarna Senior RDCS Referring Phys: [place]  1. Left ventricular ejection fraction, by estimation, is 20 to 25%. The left ventricle has severely decreased function. The left ventricle demonstrates global hypokinesis. There is moderate asymmetric left ventricular hypertrophy of the inferolateral segment. Left ventricular diastolic parameters are indeterminate.  2. Right ventricular systolic function is normal. The right ventricular size is normal. Tricuspid regurgitation signal is inadequate for assessing PA pressure.  3. Left atrial size was moderately dilated.  4. The mitral valve is normal in structure. Mild mitral valve regurgitation. No evidence of mitral stenosis.  5. The aortic valve is tricuspid. Aortic valve regurgitation is trivial. No aortic stenosis is present.  6. The inferior vena cava is dilated in size with <50% respiratory variability, suggesting right atrial pressure of 15 mmHg. FINDINGS  Left Ventricle: Left ventricular ejection fraction, by estimation, is 20 to 25%. The left ventricle has severely decreased function. The left ventricle demonstrates global hypokinesis. The left ventricular internal cavity size was normal in size. There is moderate asymmetric left ventricular hypertrophy of the inferolateral segment. Left ventricular diastolic parameters are indeterminate. Right Ventricle: The right ventricular size is normal. No increase in right ventricular wall thickness. Right ventricular systolic function is normal. Tricuspid regurgitation signal is inadequate for assessing PA pressure. Left Atrium: Left atrial size was moderately dilated. Right Atrium: Right atrial size was normal in size. Pericardium: There is no evidence of pericardial effusion. Mitral Valve: The mitral valve is normal in structure. Normal mobility of the mitral valve leaflets. Mild mitral valve regurgitation. No evidence of mitral  valve stenosis. Tricuspid Valve: The tricuspid valve is normal in structure. Tricuspid valve regurgitation is trivial. No evidence of tricuspid stenosis. Aortic Valve: The aortic valve is tricuspid. Aortic valve regurgitation is trivial. No aortic stenosis is present. Pulmonic Valve: The pulmonic valve was normal in structure. Pulmonic valve regurgitation is trivial. No evidence of pulmonic stenosis. Aorta: The aortic root is normal in size and structure. Venous: The inferior vena cava is dilated in size with less than 50% respiratory variability, suggesting right atrial pressure of 15 mmHg. IAS/Shunts: No atrial level shunt detected by color flow Doppler. Additional Comments: A pacer wire is visualized.  LEFT  VENTRICLE PLAX 2D LVIDd:         6.80 cm LVIDs:         5.90 cm LV PW:         1.40 cm LV IVS:        0.90 cm LVOT diam:     2.47 cm LV SV:         62 LV SV Index:   31 LVOT Area:     4.79 cm  LV Volumes (MOD) LV vol d, MOD A2C: 216.0 ml LV vol d, MOD A4C: 220.0 ml LV vol s, MOD A2C: 164.0 ml LV vol s, MOD A4C: 179.0 ml LV SV MOD A2C:     52.0 ml LV SV MOD A4C:     220.0 ml LV SV MOD BP:      54.3 ml RIGHT VENTRICLE RV S prime:     12.60 cm/s TAPSE (M-mode): 2.1 cm LEFT ATRIUM             Index       RIGHT ATRIUM           Index LA diam:        3.50 cm 1.76 cm/m  RA Area:     18.80 cm LA Vol (A2C):   85.7 ml 43.10 ml/m RA Volume:   47.80 ml  24.04 ml/m LA Vol (A4C):   76.6 ml 38.52 ml/m LA Biplane Vol: 83.6 ml 42.04 ml/m  AORTIC VALVE LVOT Vmax:   70.50 cm/s LVOT Vmean:  51.300 cm/s LVOT VTI:    0.130 m  AORTA Ao Root diam: 3.50 cm Ao Asc diam:  3.30 cm  SHUNTS Systemic VTI:  0.13 m Systemic Diam: 2.47 cm Cherlynn Kaiser MD Electronically signed by Cherlynn Kaiser MD Signature Date/Time: 12/25/2019/1:27:41 PM    Final    CUP PACEART REMOTE DEVICE CHECK  Result Date: 12/20/2019 Scheduled remote reviewed.  Normal device function. Battery longevity 9.39mths. AF burden <1%, EGM's show brief noise as seen  previously. OAC- Warfarin Next remote 91 days- JBox, RN/CVRS      IMPRESSION/PLAN: 1. Probable advanced lung cancer with brain metastasis. Dr. Lisbeth Renshaw has reviewed the patient's case in our brain and spine oncology conference this morning. I spoken with Dr. Burr Medico about this patient as well. The patient and I discussed the findings from imaging and the working diagnosis of a new stage IV lung cancer. We understand that he will be undergoing bronchoscopy on Wednesday. We hope that Roslyn Smiling can be done on pathology so that we will have an idea of what type of cancer he has late this week or early next. I reviewed with him the rationale for radiation to the brain and for further plans on systemic therapy with Dr. Burr Medico once additional information is known about his diagnosis, he is aware that the treatment could include chemotherapy plus or minus immunotherapy. We discussed the options of either whole brain radiotherapy if he has a small cell cancer, versus stereotactic radiosurgery [facility]) in a single fraction. He is aware that whole brain radiation has different side effects and logistical profile than SRS. If he is a candidate for SRS, he would also meet with Dr. Christella Noa and we discussed the rationale for neurosurgery to be involved even if surgical resection is not required. We discussed the risks, benefits, short and long-term effects of therapy and the patient is interested in proceeding. We anticipate that he will be discharged from the hospital within a day or 2 of his upcoming  bronchoscopy, I have sent in a new prescription for him for dexamethasone to his outpatient pharmacy. Given his significant improvement on steroids, but given also his diabetes, we will drop his dose to 4 mg twice daily. This is what the prescription that I sent in was for. He is also aware that he needs to be on proton pump inhibitor while taking high-dose steroids to reduce the risk of GI toxicity. I have tentatively scheduled him in our  department next Wednesday for simulation with IV contrast, we will also repeat his BMP prior to his discharge as his creatinine levels have been elevated. We could potentially administer half dose contrast for him when we do his simulation. We will plan his taper of dexamethasone also as an outpatient following completion of his treatment. 2. In situ ICD. We will submit a request for permission from cardiology as we plan treatment while he has an insitu device.  In a visit lasting 90 minutes, greater than 50% of the time was spent by phone and in floor time discussing the patient's condition, in preparation for the discussion, and coordinating the patient's care.    Carola Rhine, PAC

## 2019-12-26 NOTE — H&P (View-Only) (Signed)
NAME:  Dennis Zehner., MRN:  [id number], DOB:  1961-08-27, LOS: 2 ADMISSION DATE:  12/23/2019, CONSULTATION DATE:  12/24/19 REFERRING MD:  FPTS, CHIEF COMPLAINT:  Lung mass   Brief History   Cerebral mass and lung mass in a patient with COPD and cardiomyopathy EF 20%  History of present illness   59 year old male with multiple medical problems including ejection fraction 25% as of January 2020 followed by Dr. Lamar Laundry 1 in the CHF clinic, type 2 diabetes, history of stroke, chronic Coumadin use for either the stroke history or cardiac issues, greater than 40 pack tobacco smoker. Admitted on December 23, 2019 with 4-day history of left-sided weakness and slurred speech and some balance issues preceding that. Work-up has shown a 1.5 cm right cerebral mass with mass-effect. There is on a CT head. MRI cannot be done because of his pacer. CT scan of the chest as shown another 1.5 cm right middle lobe lesion that spiculated and suspicious for non-small cell lung cancer. No clear-cut description of mediastinal adenopathy. Family practice teaching service spoke to neurosurgery who are requesting tissue diagnosis from bronchoscopy before addressing the cerebral met. Patient is on Decadron since arrival and the left sided hemiparesis is slightly improving but not fully.  Past Medical History       has a past medical history of Alcohol abuse with alcohol-induced mood disorder ([place]) (08/18/2015), Automatic implantable cardioverter-defibrillator in situ (2012), CAD (coronary artery disease) (11/16/2014), Cardiac LV ejection fraction 10-20%, Chest pain (09/04/2018), CHF (congestive heart failure) ([place]), Chronic systolic heart failure ([place]), Cocaine abuse ([place]) (01/24/2015), Cocaine abuse with cocaine-induced mood disorder ([place]) (08/20/2015), Drug abuse and dependence ([place]), Dysphagia following cerebral infarction (05/20/8109), Embolic stroke involving right middle cerebral artery ([place]), H/O noncompliance with medical  treatment, presenting hazards to health (01/24/2015), Heart murmur, High cholesterol, Ischemic cardiomyopathy, 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[place]), MDD (major depressive disorder), recurrent severe, without psychosis ([place]) (08/18/2015), Open wound of foot (02/27/2015), Pneumonia (1988), Psychoactive substance-induced mood disorder ([place]) (07/17/2015), Sleep apnea, Status post PICC central line placement, Stroke [facility]), Substance induced mood disorder ([place]) (08/17/2015), and Type II diabetes mellitus ([place]).   reports that he has been smoking cigarettes. He has a 7.75 pack-year smoking history. He has never used smokeless tobacco.  Past Surgical History:  Procedure Laterality Date  . CARDIAC CATHETERIZATION  05/2014  . CARDIAC DEFIBRILLATOR PLACEMENT  03/2011  . CHOLECYSTECTOMY  1994  . FOREARM FRACTURE SURGERY Left 1980  . FRACTURE SURGERY    . RIGHT HEART CATHETERIZATION N/A 05/30/2014   Procedure: RIGHT HEART CATH;  Surgeon: Jolaine Artist, MD;  Location[facility] CATH LAB;  Service: Cardiovascular;  Laterality: N/A;  . RIGHT HEART CATHETERIZATION N/A 02/07/2015   Procedure: RIGHT HEART CATH;  Surgeon: Jolaine Artist, MD;  Location: [facility] CATH LAB;  Service: Cardiovascular;  Laterality: N/A;  . TONSILLECTOMY AND ADENOIDECTOMY  ~ 1998    No Known Allergies  Immunization History  Administered Date(s) Administered  . Influenza Whole 09/13/2007  . Influenza,inj,Quad PF,6+ Mos 09/22/2014, 08/18/2015  . Influenza-Unspecified 08/04/2013  . Pneumococcal Polysaccharide-23 09/13/2007, 08/18/2015    Family History  Problem Relation Age of Onset  . Diabetes Mother   . Heart disease Mother   . Diabetes Father   . Prostate cancer Father   . Heart disease Father   . Diabetes Brother   . Diabetes Brother      Current Facility-Administered Medications:  .  atorvastatin (LIPITOR) tablet 20 mg, 20 mg, Oral, q1800, Anderson, Chelsey L, DO, 20  mg at 12/25/19 1707 .  canagliflozin [facility]) tablet 100 mg,  100 mg, Oral, QAC breakfast, Anderson, Chelsey L, DO, 100 mg at 12/26/19 3335 .  carvedilol (COREG) tablet 9.375 mg, 9.375 mg, Oral, BID WC, Gladys Damme, MD, 9.375 mg at 12/26/19 0958 .  dexamethasone (DECADRON) tablet 6 mg, 6 mg, Oral, BID PC, McDiarmid, Blane Ohara, MD, 6 mg at 12/26/19 0958 .  insulin aspart (novoLOG) injection 0-5 Units, 0-5 Units, Subcutaneous, QHS, Gladys Damme, MD, 5 Units at 12/25/19 2158 .  insulin aspart (novoLOG) injection 0-9 Units, 0-9 Units, Subcutaneous, TID WC, Gladys Damme, MD, 2 Units at 12/26/19 [phone] .  insulin glargine (LANTUS) injection 25 Units, 25 Units, Subcutaneous, Q2200, Richarda Osmond, DO, 25 Units at 12/25/19 2159   Interim history/subjective:   No events.  Hungry.  Left sided weakness improved.  Recent Labs  Lab 12/23/19 0857 12/23/19 1056 12/24/19 0411 12/25/19 0421 12/26/19 0431  INR 3.1* 2.4* 2.7* 2.8* 1.6*      Objective   Blood pressure 111/85, pulse 79, temperature 97.7 F (36.5 C), temperature source Oral, resp. rate 18, height 6' (1.829 m), weight 77.1 kg, SpO2 99 %.        Intake/Output Summary (Last 24 hours) at 12/26/2019 1415 Last data filed at 12/26/2019 1003 Gross per 24 hour  Intake --  Output 2225 ml  Net -2225 ml   Filed Weights   12/23/19 1037  Weight: 77.1 kg    Examination: GEN: well appearing man in NAD HEENT: MMM, trachea midline CV: RRR, ext warm PULM:  Clear, no wheezing GI: Soft, +BS EXT:  No edema NEURO: weak on left but barely noticable PSYCH: AOx3, good insight SKIN: no rashes    LABS    PULMONARY Recent Labs  Lab 12/23/19 1132 12/25/19 1554  PHART  --  7.431  PCO2ART  --  35.3  PO2ART  --  76.5*  HCO3  --  23.2  TCO2 27  --   O2SAT  --  94.0    CBC Recent Labs  Lab 12/23/19 1056 12/23/19 1056 12/23/19 1132 12/24/19 0411 12/25/19 0421  HGB 13.1   < > 14.3 12.7* 11.5*  HCT 42.8   < > 42.0 40.5 36.0*  WBC 9.9  --   --  10.9* 14.3*  PLT 280  --   --  288  277   < > = values in this interval not displayed.    COAGULATION Recent Labs  Lab 12/23/19 0857 12/23/19 1056 12/24/19 0411 12/25/19 0421 12/26/19 0431  INR 3.1* 2.4* 2.7* 2.8* 1.6*    CARDIAC  No results for input(s): TROPONINI in the last 168 hours. No results for input(s): PROBNP in the last 168 hours.   CHEMISTRY Recent Labs  Lab 12/23/19 1056 12/23/19 1056 12/23/19 1132 12/23/19 1132 12/24/19 0411 12/25/19 0421  NA 139  --  139  --  139 138  K 4.6   < > 4.6   < > 4.0 4.2  CL 107  --  108  --  103 107  CO2 21*  --   --   --  23 24  GLUCOSE 135*  --  132*  --  149* 169*  BUN 25*  --  27*  --  28* 34*  CREATININE 1.64*  --  1.70*  --  1.60* 1.39*  CALCIUM 9.1  --   --   --  8.8* 8.8*   < > = values in this interval not displayed.  Estimated Creatinine Clearance: 63.9 mL/min (A) (by C-G formula based on SCr of 1.39 mg/dL (H)).   LIVER Recent Labs  Lab 12/23/19 0857 12/23/19 1056 12/24/19 0411 12/25/19 0421 12/26/19 0431  AST  --  18  --   --   --   ALT  --  13  --   --   --   ALKPHOS  --  96  --   --   --   BILITOT  --  1.0  --   --   --   PROT  --  6.7  --   --   --   ALBUMIN  --  3.3*  --   --   --   INR 3.1* 2.4* 2.7* 2.8* 1.6*     INFECTIOUS No results for input(s): LATICACIDVEN, PROCALCITON in the last 168 hours.   ENDOCRINE CBG (last 3)  Recent Labs    12/26/19 0608 12/26/19 0848 12/26/19 1209  GLUCAP 197* 120* 69*         IMAGING x48h  - image(s) personally visualized  -   highlighted in bold ECHOCARDIOGRAM COMPLETE  Result Date: 12/25/2019    ECHOCARDIOGRAM REPORT   Patient Name:   [facility] Date of Exam: 12/25/2019 Medical Rec #:  [id number]   Height:       72.0 in Accession #:    [id number]  Weight:       170.0 lb Date of Birth:  December 02, 1960    BSA:          1.988 m Patient Age:    69 years    BP:           123/85 mmHg Patient Gender: M           HR:           90 bpm. Exam Location:  Inpatient Procedure: 2D Echo, Color  Doppler and Cardiac Doppler Indications:    I42.9 Cardiomyopathy (unspecified)  History:        Patient has prior history of Echocardiogram examinations, most                 recent 11/02/2018. CHF, Pacemaker and Defibrillator; Risk                 Factors:Hypertension, Diabetes and Dyslipidemia.  Sonographer:    Raquel Sarna Senior RDCS Referring Phys: [place]  1. Left ventricular ejection fraction, by estimation, is 20 to 25%. The left ventricle has severely decreased function. The left ventricle demonstrates global hypokinesis. There is moderate asymmetric left ventricular hypertrophy of the inferolateral segment. Left ventricular diastolic parameters are indeterminate.  2. Right ventricular systolic function is normal. The right ventricular size is normal. Tricuspid regurgitation signal is inadequate for assessing PA pressure.  3. Left atrial size was moderately dilated.  4. The mitral valve is normal in structure. Mild mitral valve regurgitation. No evidence of mitral stenosis.  5. The aortic valve is tricuspid. Aortic valve regurgitation is trivial. No aortic stenosis is present.  6. The inferior vena cava is dilated in size with <50% respiratory variability, suggesting right atrial pressure of 15 mmHg. FINDINGS  Left Ventricle: Left ventricular ejection fraction, by estimation, is 20 to 25%. The left ventricle has severely decreased function. The left ventricle demonstrates global hypokinesis. The left ventricular internal cavity size was normal in size. There is moderate asymmetric left ventricular hypertrophy of the inferolateral segment. Left ventricular diastolic parameters are indeterminate. Right Ventricle: The right ventricular size is normal.  No increase in right ventricular wall thickness. Right ventricular systolic function is normal. Tricuspid regurgitation signal is inadequate for assessing PA pressure. Left Atrium: Left atrial size was moderately dilated. Right Atrium: Right  atrial size was normal in size. Pericardium: There is no evidence of pericardial effusion. Mitral Valve: The mitral valve is normal in structure. Normal mobility of the mitral valve leaflets. Mild mitral valve regurgitation. No evidence of mitral valve stenosis. Tricuspid Valve: The tricuspid valve is normal in structure. Tricuspid valve regurgitation is trivial. No evidence of tricuspid stenosis. Aortic Valve: The aortic valve is tricuspid. Aortic valve regurgitation is trivial. No aortic stenosis is present. Pulmonic Valve: The pulmonic valve was normal in structure. Pulmonic valve regurgitation is trivial. No evidence of pulmonic stenosis. Aorta: The aortic root is normal in size and structure. Venous: The inferior vena cava is dilated in size with less than 50% respiratory variability, suggesting right atrial pressure of 15 mmHg. IAS/Shunts: No atrial level shunt detected by color flow Doppler. Additional Comments: A pacer wire is visualized.  LEFT VENTRICLE PLAX 2D LVIDd:         6.80 cm LVIDs:         5.90 cm LV PW:         1.40 cm LV IVS:        0.90 cm LVOT diam:     2.47 cm LV SV:         62 LV SV Index:   31 LVOT Area:     4.79 cm  LV Volumes (MOD) LV vol d, MOD A2C: 216.0 ml LV vol d, MOD A4C: 220.0 ml LV vol s, MOD A2C: 164.0 ml LV vol s, MOD A4C: 179.0 ml LV SV MOD A2C:     52.0 ml LV SV MOD A4C:     220.0 ml LV SV MOD BP:      54.3 ml RIGHT VENTRICLE RV S prime:     12.60 cm/s TAPSE (M-mode): 2.1 cm LEFT ATRIUM             Index       RIGHT ATRIUM           Index LA diam:        3.50 cm 1.76 cm/m  RA Area:     18.80 cm LA Vol (A2C):   85.7 ml 43.10 ml/m RA Volume:   47.80 ml  24.04 ml/m LA Vol (A4C):   76.6 ml 38.52 ml/m LA Biplane Vol: 83.6 ml 42.04 ml/m  AORTIC VALVE LVOT Vmax:   70.50 cm/s LVOT Vmean:  51.300 cm/s LVOT VTI:    0.130 m  AORTA Ao Root diam: 3.50 cm Ao Asc diam:  3.30 cm  SHUNTS Systemic VTI:  0.13 m Systemic Diam: 2.47 cm Cherlynn Kaiser MD Electronically signed by Cherlynn Kaiser MD Signature Date/Time: 12/25/2019/1:27:41 PM    Final      Resolved Hospital Problem list   x  Assessment & Plan:  Right middle lobe 1.5 cm spiculated nodule Underlying smoker Underlying radiologic emphysema Right cerebral metastasis on CT scan head solitary with vasogenic edema 1.5 cm [unable to get MRI because of pacemaker] On chronic anticoagulation with CKD EF 20%  -Lesion small, peri-fissural, will be tough to get. -Discussed with Dr. Valeta Harms, willing to give it a shot Wednesday afternoon in OR: 1:30PM -Regarding warfarin, do not really see a need to bridge, unclear indication  Erskine Emery MD PCCM

## 2019-12-26 NOTE — Progress Notes (Addendum)
Progress Note Patient Name: Dennis [name]. Date of Encounter: 12/26/2019  Primary Cardiologist:  Glori Bickers, MD CHF  Subjective   Was doing very well until acute event. Walking 1/2 mile after every work shift, 4 x week. Wt has been stable, resp ok, no palpitations, ICD has not fired. No CP. Had OSA but got his tonsils out>>no probs since. Denies orthopnea, PND or LE edema.  Determined not to smoke again.  Inpatient Medications    Scheduled Meds: . atorvastatin  20 mg Oral q1800  . canagliflozin  100 mg Oral QAC breakfast  . carvedilol  9.375 mg Oral BID WC  . dexamethasone  6 mg Oral BID PC  . insulin aspart  0-5 Units Subcutaneous QHS  . insulin aspart  0-9 Units Subcutaneous TID WC  . insulin glargine  25 Units Subcutaneous Q2200   Continuous Infusions:  PRN Meds:    Vital Signs    Vitals:   12/25/19 2016 12/25/19 2326 12/26/19 0325 12/26/19 0855  BP: 118/81 115/78 122/82 121/84  Pulse: 87 86 81 80  Resp: 19 16 16 18   Temp: 97.9 F (36.6 C) 97.9 F (36.6 C) 98 F (36.7 C) 97.7 F (36.5 C)  TempSrc: Oral Oral Oral Oral  SpO2: 99% 100% 100% 100%  Weight:      Height:        Intake/Output Summary (Last 24 hours) at 12/26/2019 0932 Last data filed at 12/26/2019 0602 Gross per 24 hour  Intake 240 ml  Output 1525 ml  Net -1285 ml   Filed Weights   12/23/19 1037  Weight: 77.1 kg   Last Weight  Most recent update: 12/23/2019 10:37 AM   Weight  77.1 kg (170 lb)           Weight change:    Telemetry    SR, PVCs, rare pace bt - Personally Reviewed  ECG    03/12, SR, HR 88, LVH, biatrial enlargement, not acute - Personally Reviewed  Physical Exam   General: Well developed, slender, male appearing in no acute distress. Head: Normocephalic, atraumatic.  Neck: Supple without bruits, JVD not elevated. Lungs:  Resp regular and unlabored, CTA except for rare rhonchi Heart: RRR, S1, S2, no S3, S4, or murmur; no rub. Abdomen: Soft, non-tender,  non-distended with normoactive bowel sounds. No hepatomegaly. No rebound/guarding. No obvious abdominal masses. Extremities: No clubbing, cyanosis, no edema. Distal pedal pulses are 2+ bilaterally. Neuro: Alert and oriented X 3. Moves all extremities spontaneously. Psych: Normal affect.  Labs    Hematology Recent Labs  Lab 12/23/19 1056 12/23/19 1056 12/23/19 1132 12/24/19 0411 12/25/19 0421  WBC 9.9  --   --  10.9* 14.3*  RBC 4.39  --   --  4.28 3.86*  HGB 13.1   < > 14.3 12.7* 11.5*  HCT 42.8   < > 42.0 40.5 36.0*  MCV 97.5  --   --  94.6 93.3  MCH 29.8  --   --  29.7 29.8  MCHC 30.6  --   --  31.4 31.9  RDW 13.5  --   --  13.4 13.6  PLT 280  --   --  288 277   < > = values in this interval not displayed.    Chemistry Recent Labs  Lab 12/23/19 1056 12/23/19 1056 12/23/19 1132 12/24/19 0411 12/25/19 0421  NA 139   < > 139 139 138  K 4.6   < > 4.6 4.0 4.2  CL 107   < >  108 103 107  CO2 21*  --   --  23 24  GLUCOSE 135*   < > 132* 149* 169*  BUN 25*   < > 27* 28* 34*  CREATININE 1.64*   < > 1.70* 1.60* 1.39*  CALCIUM 9.1  --   --  8.8* 8.8*  PROT 6.7  --   --   --   --   ALBUMIN 3.3*  --   --   --   --   AST 18  --   --   --   --   ALT 13  --   --   --   --   ALKPHOS 96  --   --   --   --   BILITOT 1.0  --   --   --   --   GFRNONAA 46*  --   --  47* 56*  GFRAA 53*  --   --  55* >60  ANIONGAP 11  --   --  13 7   < > = values in this interval not displayed.     High Sensitivity Troponin:  No results for input(s): TROPONINIHS in the last 720 hours.    BNPNo results for input(s): BNP, PROBNP in the last 168 hours.   Lab Results  Component Value Date   INR 1.6 (H) 12/26/2019   INR 2.8 (H) 12/25/2019   INR 2.7 (H) 12/24/2019     Radiology    CT HEAD WO CONTRAST  Result Date: 12/23/2019 CLINICAL DATA:  Left arm weakness since Monday or Tuesday. Fall on Wednesday night. EXAM: CT HEAD WITHOUT CONTRAST TECHNIQUE: Contiguous axial images were obtained from  the base of the skull through the vertex without intravenous contrast. COMPARISON:  01/24/2015 FINDINGS: Brain: 14 mm dense intra-axial mass with moderate vasogenic edema in the high right frontal lobe. Remote infarct in the right frontal operculum and insula. No acute hemorrhage or hydrocephalus. No acute infarct. Vascular: No hyperdense vessel. Skull: No evidence of fracture or bone lesion Sinuses/Orbits: Negative IMPRESSION: 1. 14 mm right frontal mass with vasogenic edema. Recommend brain MRI with contrast. 2. Chronic right insular and opercular infarct. Electronically Signed   By: Monte Fantasia M.D.   On: 12/23/2019 11:16   CT HEAD W CONTRAST  Result Date: 12/23/2019 CLINICAL DATA:  Cerebral mass. EXAM: CT HEAD WITH CONTRAST TECHNIQUE: Contiguous axial images were obtained from the base of the skull through the vertex with intravenous contrast. CONTRAST:  135mL OMNIPAQUE IOHEXOL 300 MG/ML  SOLN COMPARISON:  CT head without contrast 12/23/2019 FINDINGS: Brain: Postcontrast images confirm a homogeneously enhancing mass lesion in the posterior right frontal lobe measuring 1.4 x 1.5 x 1.5 cm. Surrounding vasogenic edema is present with partial effacement of sulci. No significant midline shift is present. No other foci of enhancement are present. Remote infarcts involving the right insular cortex as well as the anterior frontal lobe and precentral gyrus are again noted. The brainstem and cerebellum are within normal limits. The left hemisphere is unremarkable. Vascular: Contrast is present in the major vascular structures. Skull: Calvarium is intact. No focal lytic or blastic lesions are present. No significant extracranial soft tissue lesion is present. Sinuses/Orbits: The paranasal sinuses and mastoid air cells are clear. The globes and orbits are within normal limits. IMPRESSION: 1. 1.4 x 1.5 x 1.5 cm homogeneously enhancing mass lesion in the posterior right frontal lobe with surrounding vasogenic edema  and partial effacement of sulci. This most likely represents  a solitary metastasis. Primary brain tumor is not excluded. 2. No significant midline shift. 3. Remote infarcts involving the right insular cortex and anterior frontal lobe and precentral gyrus. Electronically Signed   By: San Morelle M.D.   On: 12/23/2019 19:12   CT CHEST W CONTRAST  Result Date: 12/23/2019 CLINICAL DATA:  Inpatient. Right frontal lobe cerebral mass with vasogenic edema on head CT. Assess for primary neoplasm. EXAM: CT CHEST, ABDOMEN, AND PELVIS WITH CONTRAST TECHNIQUE: Multidetector CT imaging of the chest, abdomen and pelvis was performed following the standard protocol during bolus administration of intravenous contrast. CONTRAST:  127mL OMNIPAQUE IOHEXOL 300 MG/ML  SOLN COMPARISON:  06/25/2011 chest CT angiogram and 06/26/2011 CT abdomen. FINDINGS: CT CHEST FINDINGS Cardiovascular: Normal heart size. No significant pericardial effusion/thickening. Two lead left subclavian ICD is noted with lead tips in the right atrium and right ventricular apex. Left anterior descending coronary atherosclerosis. Atherosclerotic nonaneurysmal thoracic aorta. Normal caliber pulmonary arteries. No central pulmonary emboli. Mediastinum/Nodes: No discrete thyroid nodules. Unremarkable esophagus. No axillary adenopathy. Mildly enlarged 1.1 cm aortopulmonary node (series 3/image 23), new since 2012. No additional pathologically enlarged mediastinal nodes. No pathologically enlarged hilar nodes. Lungs/Pleura: No pneumothorax. No pleural effusion. Moderate centrilobular and paraseptal emphysema. No acute consolidative airspace disease or lung masses. Irregular spiculated posterior right middle lobe 1.5 x 0.7 cm pulmonary nodule abutting and distorting the right major fissure (series 5/image 97), which may represent growth of a 0.5 cm nodule seen in this location on 06/24/2011 chest CT. No additional significant pulmonary nodules. Parenchymal  banding at the dependent left lung base compatible with nonspecific postinfectious/postinflammatory scarring. Musculoskeletal: No aggressive appearing focal osseous lesions. Moderate symmetric bilateral gynecomastia is unchanged. CT ABDOMEN PELVIS FINDINGS Hepatobiliary: Normal liver with no liver mass. Cholecystectomy. No biliary ductal dilatation. Pancreas: Normal, with no mass or duct dilation. Spleen: Normal size. No mass. Adrenals/Urinary Tract: Right adrenal 3.2 cm nodule with density 45 HU minimally increased from 2.8 cm on 06/24/2011 CT. No left adrenal nodules. No hydronephrosis. Subcentimeter hypodense renal cortical lesion in the upper right kidney is too small to characterize and requires no follow-up. Scattered mild renal cortical scarring in the kidneys bilaterally. Normal bladder. Stomach/Bowel: Normal non-distended stomach. Normal caliber small bowel with no small bowel wall thickening. Appendix not discretely visualized. Normal large bowel with no diverticulosis, large bowel wall thickening or pericolonic fat stranding. Vascular/Lymphatic: Atherosclerotic nonaneurysmal abdominal aorta. Patent portal, splenic, hepatic and renal veins. No pathologically enlarged lymph nodes in the abdomen or pelvis. Reproductive: Top-normal size prostate. Other: No pneumoperitoneum, ascites or focal fluid collection. Musculoskeletal: No aggressive appearing focal osseous lesions. Healed deformity in the lateral left iliac wing. Moderate degenerative disc disease at L5-S1 with unilateral right L5 pars defect. IMPRESSION: 1. Spiculated 1.5 x 0.7 cm posterior right middle lobe pulmonary nodule abutting and distorting the right major fissure, suspicious for primary bronchogenic carcinoma. 2. Solitary mildly enlarged aortopulmonary mediastinal lymph node. Metastatic disease not excluded. 3. Indeterminate right adrenal 3.2 cm nodule, mildly increased since 2012 CT, more likely a slow growing adenoma. 4. Suggest short-term  outpatient PET-CT for further staging evaluation. 5. Aortic Atherosclerosis (ICD10-I70.0) and Emphysema (ICD10-J43.9). Electronically Signed   By: Ilona Sorrel M.D.   On: 12/23/2019 18:57   CT ABDOMEN PELVIS W CONTRAST  Result Date: 12/23/2019 CLINICAL DATA:  Inpatient. Right frontal lobe cerebral mass with vasogenic edema on head CT. Assess for primary neoplasm. EXAM: CT CHEST, ABDOMEN, AND PELVIS WITH CONTRAST TECHNIQUE: Multidetector CT imaging of the chest, abdomen  and pelvis was performed following the standard protocol during bolus administration of intravenous contrast. CONTRAST:  174mL OMNIPAQUE IOHEXOL 300 MG/ML  SOLN COMPARISON:  06/25/2011 chest CT angiogram and 06/26/2011 CT abdomen. FINDINGS: CT CHEST FINDINGS Cardiovascular: Normal heart size. No significant pericardial effusion/thickening. Two lead left subclavian ICD is noted with lead tips in the right atrium and right ventricular apex. Left anterior descending coronary atherosclerosis. Atherosclerotic nonaneurysmal thoracic aorta. Normal caliber pulmonary arteries. No central pulmonary emboli. Mediastinum/Nodes: No discrete thyroid nodules. Unremarkable esophagus. No axillary adenopathy. Mildly enlarged 1.1 cm aortopulmonary node (series 3/image 23), new since 2012. No additional pathologically enlarged mediastinal nodes. No pathologically enlarged hilar nodes. Lungs/Pleura: No pneumothorax. No pleural effusion. Moderate centrilobular and paraseptal emphysema. No acute consolidative airspace disease or lung masses. Irregular spiculated posterior right middle lobe 1.5 x 0.7 cm pulmonary nodule abutting and distorting the right major fissure (series 5/image 97), which may represent growth of a 0.5 cm nodule seen in this location on 06/24/2011 chest CT. No additional significant pulmonary nodules. Parenchymal banding at the dependent left lung base compatible with nonspecific postinfectious/postinflammatory scarring. Musculoskeletal: No aggressive  appearing focal osseous lesions. Moderate symmetric bilateral gynecomastia is unchanged. CT ABDOMEN PELVIS FINDINGS Hepatobiliary: Normal liver with no liver mass. Cholecystectomy. No biliary ductal dilatation. Pancreas: Normal, with no mass or duct dilation. Spleen: Normal size. No mass. Adrenals/Urinary Tract: Right adrenal 3.2 cm nodule with density 45 HU minimally increased from 2.8 cm on 06/24/2011 CT. No left adrenal nodules. No hydronephrosis. Subcentimeter hypodense renal cortical lesion in the upper right kidney is too small to characterize and requires no follow-up. Scattered mild renal cortical scarring in the kidneys bilaterally. Normal bladder. Stomach/Bowel: Normal non-distended stomach. Normal caliber small bowel with no small bowel wall thickening. Appendix not discretely visualized. Normal large bowel with no diverticulosis, large bowel wall thickening or pericolonic fat stranding. Vascular/Lymphatic: Atherosclerotic nonaneurysmal abdominal aorta. Patent portal, splenic, hepatic and renal veins. No pathologically enlarged lymph nodes in the abdomen or pelvis. Reproductive: Top-normal size prostate. Other: No pneumoperitoneum, ascites or focal fluid collection. Musculoskeletal: No aggressive appearing focal osseous lesions. Healed deformity in the lateral left iliac wing. Moderate degenerative disc disease at L5-S1 with unilateral right L5 pars defect. IMPRESSION: 1. Spiculated 1.5 x 0.7 cm posterior right middle lobe pulmonary nodule abutting and distorting the right major fissure, suspicious for primary bronchogenic carcinoma. 2. Solitary mildly enlarged aortopulmonary mediastinal lymph node. Metastatic disease not excluded. 3. Indeterminate right adrenal 3.2 cm nodule, mildly increased since 2012 CT, more likely a slow growing adenoma. 4. Suggest short-term outpatient PET-CT for further staging evaluation. 5. Aortic Atherosclerosis (ICD10-I70.0) and Emphysema (ICD10-J43.9). Electronically Signed    By: Ilona Sorrel M.D.   On: 12/23/2019 18:57   ECHOCARDIOGRAM COMPLETE  Result Date: 12/25/2019    ECHOCARDIOGRAM REPORT   Patient Name:   [facility] Date of Exam: 12/25/2019 Medical Rec #:  [id number]   Height:       72.0 in Accession #:    [id number]  Weight:       170.0 lb Date of Birth:  Aug 09, 1961    BSA:          1.988 m Patient Age:    59 years    BP:           123/85 mmHg Patient Gender: M           HR:           90 bpm. Exam Location:  Inpatient Procedure: 2D Echo, Color  Doppler and Cardiac Doppler Indications:    I42.9 Cardiomyopathy (unspecified)  History:        Patient has prior history of Echocardiogram examinations, most                 recent 11/02/2018. CHF, Pacemaker and Defibrillator; Risk                 Factors:Hypertension, Diabetes and Dyslipidemia.  Sonographer:    Raquel Sarna Senior RDCS Referring Phys: [place]  1. Left ventricular ejection fraction, by estimation, is 20 to 25%. The left ventricle has severely decreased function. The left ventricle demonstrates global hypokinesis. There is moderate asymmetric left ventricular hypertrophy of the inferolateral segment. Left ventricular diastolic parameters are indeterminate.  2. Right ventricular systolic function is normal. The right ventricular size is normal. Tricuspid regurgitation signal is inadequate for assessing PA pressure.  3. Left atrial size was moderately dilated.  4. The mitral valve is normal in structure. Mild mitral valve regurgitation. No evidence of mitral stenosis.  5. The aortic valve is tricuspid. Aortic valve regurgitation is trivial. No aortic stenosis is present.  6. The inferior vena cava is dilated in size with <50% respiratory variability, suggesting right atrial pressure of 15 mmHg. FINDINGS  Left Ventricle: Left ventricular ejection fraction, by estimation, is 20 to 25%. The left ventricle has severely decreased function. The left ventricle demonstrates global hypokinesis. The left ventricular  internal cavity size was normal in size. There is moderate asymmetric left ventricular hypertrophy of the inferolateral segment. Left ventricular diastolic parameters are indeterminate. Right Ventricle: The right ventricular size is normal. No increase in right ventricular wall thickness. Right ventricular systolic function is normal. Tricuspid regurgitation signal is inadequate for assessing PA pressure. Left Atrium: Left atrial size was moderately dilated. Right Atrium: Right atrial size was normal in size. Pericardium: There is no evidence of pericardial effusion. Mitral Valve: The mitral valve is normal in structure. Normal mobility of the mitral valve leaflets. Mild mitral valve regurgitation. No evidence of mitral valve stenosis. Tricuspid Valve: The tricuspid valve is normal in structure. Tricuspid valve regurgitation is trivial. No evidence of tricuspid stenosis. Aortic Valve: The aortic valve is tricuspid. Aortic valve regurgitation is trivial. No aortic stenosis is present. Pulmonic Valve: The pulmonic valve was normal in structure. Pulmonic valve regurgitation is trivial. No evidence of pulmonic stenosis. Aorta: The aortic root is normal in size and structure. Venous: The inferior vena cava is dilated in size with less than 50% respiratory variability, suggesting right atrial pressure of 15 mmHg. IAS/Shunts: No atrial level shunt detected by color flow Doppler. Additional Comments: A pacer wire is visualized.  LEFT VENTRICLE PLAX 2D LVIDd:         6.80 cm LVIDs:         5.90 cm LV PW:         1.40 cm LV IVS:        0.90 cm LVOT diam:     2.47 cm LV SV:         62 LV SV Index:   31 LVOT Area:     4.79 cm  LV Volumes (MOD) LV vol d, MOD A2C: 216.0 ml LV vol d, MOD A4C: 220.0 ml LV vol s, MOD A2C: 164.0 ml LV vol s, MOD A4C: 179.0 ml LV SV MOD A2C:     52.0 ml LV SV MOD A4C:     220.0 ml LV SV MOD BP:      54.3 ml  RIGHT VENTRICLE RV S prime:     12.60 cm/s TAPSE (M-mode): 2.1 cm LEFT ATRIUM              Index       RIGHT ATRIUM           Index LA diam:        3.50 cm 1.76 cm/m  RA Area:     18.80 cm LA Vol (A2C):   85.7 ml 43.10 ml/m RA Volume:   47.80 ml  24.04 ml/m LA Vol (A4C):   76.6 ml 38.52 ml/m LA Biplane Vol: 83.6 ml 42.04 ml/m  AORTIC VALVE LVOT Vmax:   70.50 cm/s LVOT Vmean:  51.300 cm/s LVOT VTI:    0.130 m  AORTA Ao Root diam: 3.50 cm Ao Asc diam:  3.30 cm  SHUNTS Systemic VTI:  0.13 m Systemic Diam: 2.47 cm Cherlynn Kaiser MD Electronically signed by Cherlynn Kaiser MD Signature Date/Time: 12/25/2019/1:27:41 PM    Final      Cardiac Studies   ECHO:  12/25/2019 1. Left ventricular ejection fraction, by estimation, is 20 to 25%. The  left ventricle has severely decreased function. The left ventricle  demonstrates global hypokinesis. There is moderate asymmetric left  ventricular hypertrophy of the inferolateral  segment. Left ventricular diastolic parameters are indeterminate.  2. Right ventricular systolic function is normal. The right ventricular  size is normal. Tricuspid regurgitation signal is inadequate for assessing  PA pressure.  3. Left atrial size was moderately dilated.  4. The mitral valve is normal in structure. Mild mitral valve  regurgitation. No evidence of mitral stenosis.  5. The aortic valve is tricuspid. Aortic valve regurgitation is trivial.  No aortic stenosis is present.  6. The inferior vena cava is dilated in size with <50% respiratory  variability, suggesting right atrial pressure of 15 mmHg.   Patient Profile     59 y.o. 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 Wt 74 kg at last CHF visit, on Coreg, Entresto, spiro.  Admitted 03/12 w/ ?CVA, but had 1.5 cm R cerebral mass, 1.5 cm spiculated RML lung lesion ?[place] lung CA.  Assessment & Plan    1. ?[place] [facility] - CCM has seen, coumadin reversed - he is for  electro  navigational bronchoscopy with endobronchial ultrasound for tissue diagnosis of very likely non-small cell lung cancer  - Cards eval for anesthesia risk requested  2. preop cardiology eval: - pt w/ good activity level pta - no ischemic sx - his risk is increased for procedure, but no further workup is indicated - he understands the risks and wishes to proceed  3. S-CHF - wt is up 3 kg from last CHF visit - however, pt denies increased DOE/SOB - RA pressure 15 mmHg on echo, discuss diuresis w/ MD - Not on Lasix pta, had not needed it - was on spiro 25 mg qd and Entresto 97/103 bid>>both d/c'd  4. AKI - BUN/Cr 12/1.28 on 10/07/2019 - 25/1.64 on admit, now 34/1.39 - pt given IVF but had dye studies - continue to follow  5. HTN - SBP 104-122 last 24 hr - on home dose Coreg, off spiro, Entresto   Principal Problem:   Brain mass Active Problems:   Type II diabetes mellitus with ophthalmic manifestations ([place])   Long term (current) use of anticoagulants   Cardiomyopathy, ischemic   Automatic implantable  cardioverter-defibrillator in situ   Chronic systolic heart failure ([place])   Hyperlipidemia associated with type 2 diabetes mellitus ([place])   Essential hypertension   History of ischemic right MCA stroke   Vasogenic cerebral edema ([facility])   Left leg paresthesias   Mass of middle lobe of right lung    Signed, Rosaria Ferries , PA-C 9:32 AM 12/26/2019 Pager: [phone]  I have seen and examined the patient along with Rosaria Ferries , PA-C.  I have reviewed the chart, notes and new data.  I agree with PA/NP's note.  Key new complaints: breathing may be a little worse than baseline, but he can walk and speak on his phone without stopping to catch his breath. Weight 3 kg above usual "dry weight". Key examination changes: appears fit and no overt signs of hypervolemia. Key new findings / data: creatinine better since admission. INR 1.6  PLAN: For electronavigation EBUS guided  lung biopsy on Wednesday. Available to temporarily turn ICD therapies "off" to avoid unnecessary shocks if Pulmonary team wishes. L subclavian ICD should not interfere with XRT for R middle lobe mass. Will restart Entresto and spironolactone gradually. Will add PPI for gastric protection on corticosteroid therapy.   Sanda Klein, MD, [place] [phone] 12/26/2019, 3:58 PM

## 2019-12-26 NOTE — Consult Note (Signed)
NAME:  Dennis [name]., MRN:  [id number], DOB:  1961-08-27, LOS: 2 ADMISSION DATE:  12/23/2019, CONSULTATION DATE:  12/24/19 REFERRING MD:  FPTS, CHIEF COMPLAINT:  Lung mass   Brief History   Cerebral mass and lung mass in a patient with COPD and cardiomyopathy EF 20%  History of present illness   59 year old male with multiple medical problems including ejection fraction 25% as of January 2020 followed by Dr. Lamar Laundry 1 in the CHF clinic, type 2 diabetes, history of stroke, chronic Coumadin use for either the stroke history or cardiac issues, greater than 40 pack tobacco smoker. Admitted on December 23, 2019 with 4-day history of left-sided weakness and slurred speech and some balance issues preceding that. Work-up has shown a 1.5 cm right cerebral mass with mass-effect. There is on a CT head. MRI cannot be done because of his pacer. CT scan of the chest as shown another 1.5 cm right middle lobe lesion that spiculated and suspicious for non-small cell lung cancer. No clear-cut description of mediastinal adenopathy. Family practice teaching service spoke to neurosurgery who are requesting tissue diagnosis from bronchoscopy before addressing the cerebral met. Patient is on Decadron since arrival and the left sided hemiparesis is slightly improving but not fully.  Past Medical History       has a past medical history of Alcohol abuse with alcohol-induced mood disorder ([place]) (08/18/2015), Automatic implantable cardioverter-defibrillator in situ (2012), CAD (coronary artery disease) (11/16/2014), Cardiac LV ejection fraction 10-20%, Chest pain (09/04/2018), CHF (congestive heart failure) ([place]), Chronic systolic heart failure ([place]), Cocaine abuse ([place]) (01/24/2015), Cocaine abuse with cocaine-induced mood disorder ([place]) (08/20/2015), Drug abuse and dependence ([place]), Dysphagia following cerebral infarction (05/20/8109), Embolic stroke involving right middle cerebral artery ([place]), H/O noncompliance with medical  treatment, presenting hazards to health (01/24/2015), Heart murmur, High cholesterol, Ischemic cardiomyopathy, 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[place]), MDD (major depressive disorder), recurrent severe, without psychosis ([place]) (08/18/2015), Open wound of foot (02/27/2015), Pneumonia (1988), Psychoactive substance-induced mood disorder ([place]) (07/17/2015), Sleep apnea, Status post PICC central line placement, Stroke [facility]), Substance induced mood disorder ([place]) (08/17/2015), and Type II diabetes mellitus ([place]).   reports that he has been smoking cigarettes. He has a 7.75 pack-year smoking history. He has never used smokeless tobacco.  Past Surgical History:  Procedure Laterality Date  . CARDIAC CATHETERIZATION  05/2014  . CARDIAC DEFIBRILLATOR PLACEMENT  03/2011  . CHOLECYSTECTOMY  1994  . FOREARM FRACTURE SURGERY Left 1980  . FRACTURE SURGERY    . RIGHT HEART CATHETERIZATION N/A 05/30/2014   Procedure: RIGHT HEART CATH;  Surgeon: Jolaine Artist, MD;  Location: [facility] CATH LAB;  Service: Cardiovascular;  Laterality: N/A;  . RIGHT HEART CATHETERIZATION N/A 02/07/2015   Procedure: RIGHT HEART CATH;  Surgeon: Jolaine Artist, MD;  Location: [facility] CATH LAB;  Service: Cardiovascular;  Laterality: N/A;  . TONSILLECTOMY AND ADENOIDECTOMY  ~ 1998    No Known Allergies  Immunization History  Administered Date(s) Administered  . Influenza Whole 09/13/2007  . Influenza,inj,Quad PF,6+ Mos 09/22/2014, 08/18/2015  . Influenza-Unspecified 08/04/2013  . Pneumococcal Polysaccharide-23 09/13/2007, 08/18/2015    Family History  Problem Relation Age of Onset  . Diabetes Mother   . Heart disease Mother   . Diabetes Father   . Prostate cancer Father   . Heart disease Father   . Diabetes Brother   . Diabetes Brother      Current Facility-Administered Medications:  .  atorvastatin (LIPITOR) tablet 20 mg, 20 mg, Oral, q1800, Anderson, Chelsey L, DO, 20  mg at 12/25/19 1707 .  canagliflozin [facility]) tablet 100 mg,  100 mg, Oral, QAC breakfast, Anderson, Chelsey L, DO, 100 mg at 12/26/19 3335 .  carvedilol (COREG) tablet 9.375 mg, 9.375 mg, Oral, BID WC, Gladys Damme, MD, 9.375 mg at 12/26/19 0958 .  dexamethasone (DECADRON) tablet 6 mg, 6 mg, Oral, BID PC, McDiarmid, Blane Ohara, MD, 6 mg at 12/26/19 0958 .  insulin aspart (novoLOG) injection 0-5 Units, 0-5 Units, Subcutaneous, QHS, Gladys Damme, MD, 5 Units at 12/25/19 2158 .  insulin aspart (novoLOG) injection 0-9 Units, 0-9 Units, Subcutaneous, TID WC, Gladys Damme, MD, 2 Units at 12/26/19 [phone] .  insulin glargine (LANTUS) injection 25 Units, 25 Units, Subcutaneous, Q2200, Richarda Osmond, DO, 25 Units at 12/25/19 2159   Interim history/subjective:   No events.  Hungry.  Left sided weakness improved.  Recent Labs  Lab 12/23/19 0857 12/23/19 1056 12/24/19 0411 12/25/19 0421 12/26/19 0431  INR 3.1* 2.4* 2.7* 2.8* 1.6*      Objective   Blood pressure 111/85, pulse 79, temperature 97.7 F (36.5 C), temperature source Oral, resp. rate 18, height 6' (1.829 m), weight 77.1 kg, SpO2 99 %.        Intake/Output Summary (Last 24 hours) at 12/26/2019 1415 Last data filed at 12/26/2019 1003 Gross per 24 hour  Intake --  Output 2225 ml  Net -2225 ml   Filed Weights   12/23/19 1037  Weight: 77.1 kg    Examination: GEN: well appearing man in NAD HEENT: MMM, trachea midline CV: RRR, ext warm PULM:  Clear, no wheezing GI: Soft, +BS EXT:  No edema NEURO: weak on left but barely noticable PSYCH: AOx3, good insight SKIN: no rashes    LABS    PULMONARY Recent Labs  Lab 12/23/19 1132 12/25/19 1554  PHART  --  7.431  PCO2ART  --  35.3  PO2ART  --  76.5*  HCO3  --  23.2  TCO2 27  --   O2SAT  --  94.0    CBC Recent Labs  Lab 12/23/19 1056 12/23/19 1056 12/23/19 1132 12/24/19 0411 12/25/19 0421  HGB 13.1   < > 14.3 12.7* 11.5*  HCT 42.8   < > 42.0 40.5 36.0*  WBC 9.9  --   --  10.9* 14.3*  PLT 280  --   --  288  277   < > = values in this interval not displayed.    COAGULATION Recent Labs  Lab 12/23/19 0857 12/23/19 1056 12/24/19 0411 12/25/19 0421 12/26/19 0431  INR 3.1* 2.4* 2.7* 2.8* 1.6*    CARDIAC  No results for input(s): TROPONINI in the last 168 hours. No results for input(s): PROBNP in the last 168 hours.   CHEMISTRY Recent Labs  Lab 12/23/19 1056 12/23/19 1056 12/23/19 1132 12/23/19 1132 12/24/19 0411 12/25/19 0421  NA 139  --  139  --  139 138  K 4.6   < > 4.6   < > 4.0 4.2  CL 107  --  108  --  103 107  CO2 21*  --   --   --  23 24  GLUCOSE 135*  --  132*  --  149* 169*  BUN 25*  --  27*  --  28* 34*  CREATININE 1.64*  --  1.70*  --  1.60* 1.39*  CALCIUM 9.1  --   --   --  8.8* 8.8*   < > = values in this interval not displayed.  Estimated Creatinine Clearance: 63.9 mL/min (A) (by C-G formula based on SCr of 1.39 mg/dL (H)).   LIVER Recent Labs  Lab 12/23/19 0857 12/23/19 1056 12/24/19 0411 12/25/19 0421 12/26/19 0431  AST  --  18  --   --   --   ALT  --  13  --   --   --   ALKPHOS  --  96  --   --   --   BILITOT  --  1.0  --   --   --   PROT  --  6.7  --   --   --   ALBUMIN  --  3.3*  --   --   --   INR 3.1* 2.4* 2.7* 2.8* 1.6*     INFECTIOUS No results for input(s): LATICACIDVEN, PROCALCITON in the last 168 hours.   ENDOCRINE CBG (last 3)  Recent Labs    12/26/19 0608 12/26/19 0848 12/26/19 1209  GLUCAP 197* 120* 69*         IMAGING x48h  - image(s) personally visualized  -   highlighted in bold ECHOCARDIOGRAM COMPLETE  Result Date: 12/25/2019    ECHOCARDIOGRAM REPORT   Patient Name:   Ricco Lubbers Date of Exam: 12/25/2019 Medical Rec #:  [id number]   Height:       72.0 in Accession #:    [id number]  Weight:       170.0 lb Date of Birth:  03/09/1961    BSA:          1.988 m Patient Age:    58 years    BP:           123/85 mmHg Patient Gender: M           HR:           90 bpm. Exam Location:  Inpatient Procedure: 2D Echo, Color  Doppler and Cardiac Doppler Indications:    I42.9 Cardiomyopathy (unspecified)  History:        Patient has prior history of Echocardiogram examinations, most                 recent 11/02/2018. CHF, Pacemaker and Defibrillator; Risk                 Factors:Hypertension, Diabetes and Dyslipidemia.  Sonographer:    Emily Senior RDCS Referring Phys: [id number] MURALI RAMASWAMY IMPRESSIONS  1. Left ventricular ejection fraction, by estimation, is 20 to 25%. The left ventricle has severely decreased function. The left ventricle demonstrates global hypokinesis. There is moderate asymmetric left ventricular hypertrophy of the inferolateral segment. Left ventricular diastolic parameters are indeterminate.  2. Right ventricular systolic function is normal. The right ventricular size is normal. Tricuspid regurgitation signal is inadequate for assessing PA pressure.  3. Left atrial size was moderately dilated.  4. The mitral valve is normal in structure. Mild mitral valve regurgitation. No evidence of mitral stenosis.  5. The aortic valve is tricuspid. Aortic valve regurgitation is trivial. No aortic stenosis is present.  6. The inferior vena cava is dilated in size with <50% respiratory variability, suggesting right atrial pressure of 15 mmHg. FINDINGS  Left Ventricle: Left ventricular ejection fraction, by estimation, is 20 to 25%. The left ventricle has severely decreased function. The left ventricle demonstrates global hypokinesis. The left ventricular internal cavity size was normal in size. There is moderate asymmetric left ventricular hypertrophy of the inferolateral segment. Left ventricular diastolic parameters are indeterminate. Right Ventricle: The right ventricular size is normal.   No increase in right ventricular wall thickness. Right ventricular systolic function is normal. Tricuspid regurgitation signal is inadequate for assessing PA pressure. Left Atrium: Left atrial size was moderately dilated. Right Atrium: Right  atrial size was normal in size. Pericardium: There is no evidence of pericardial effusion. Mitral Valve: The mitral valve is normal in structure. Normal mobility of the mitral valve leaflets. Mild mitral valve regurgitation. No evidence of mitral valve stenosis. Tricuspid Valve: The tricuspid valve is normal in structure. Tricuspid valve regurgitation is trivial. No evidence of tricuspid stenosis. Aortic Valve: The aortic valve is tricuspid. Aortic valve regurgitation is trivial. No aortic stenosis is present. Pulmonic Valve: The pulmonic valve was normal in structure. Pulmonic valve regurgitation is trivial. No evidence of pulmonic stenosis. Aorta: The aortic root is normal in size and structure. Venous: The inferior vena cava is dilated in size with less than 50% respiratory variability, suggesting right atrial pressure of 15 mmHg. IAS/Shunts: No atrial level shunt detected by color flow Doppler. Additional Comments: A pacer wire is visualized.  LEFT VENTRICLE PLAX 2D LVIDd:         6.80 cm LVIDs:         5.90 cm LV PW:         1.40 cm LV IVS:        0.90 cm LVOT diam:     2.47 cm LV SV:         62 LV SV Index:   31 LVOT Area:     4.79 cm  LV Volumes (MOD) LV vol d, MOD A2C: 216.0 ml LV vol d, MOD A4C: 220.0 ml LV vol s, MOD A2C: 164.0 ml LV vol s, MOD A4C: 179.0 ml LV SV MOD A2C:     52.0 ml LV SV MOD A4C:     220.0 ml LV SV MOD BP:      54.3 ml RIGHT VENTRICLE RV S prime:     12.60 cm/s TAPSE (M-mode): 2.1 cm LEFT ATRIUM             Index       RIGHT ATRIUM           Index LA diam:        3.50 cm 1.76 cm/m  RA Area:     18.80 cm LA Vol (A2C):   85.7 ml 43.10 ml/m RA Volume:   47.80 ml  24.04 ml/m LA Vol (A4C):   76.6 ml 38.52 ml/m LA Biplane Vol: 83.6 ml 42.04 ml/m  AORTIC VALVE LVOT Vmax:   70.50 cm/s LVOT Vmean:  51.300 cm/s LVOT VTI:    0.130 m  AORTA Ao Root diam: 3.50 cm Ao Asc diam:  3.30 cm  SHUNTS Systemic VTI:  0.13 m Systemic Diam: 2.47 cm Cherlynn Kaiser MD Electronically signed by Cherlynn Kaiser MD Signature Date/Time: 12/25/2019/1:27:41 PM    Final      Resolved Hospital Problem list   x  Assessment & Plan:  Right middle lobe 1.5 cm spiculated nodule Underlying smoker Underlying radiologic emphysema Right cerebral metastasis on CT scan head solitary with vasogenic edema 1.5 cm [unable to get MRI because of pacemaker] On chronic anticoagulation with CKD EF 20%  -Lesion small, peri-fissural, will be tough to get. -Discussed with Dr. Valeta Harms, willing to give it a shot Wednesday afternoon in OR: 1:30PM -Regarding warfarin, do not really see a need to bridge, unclear indication  Erskine Emery MD PCCM

## 2019-12-26 NOTE — Progress Notes (Signed)
Occupational Therapy Treatment Patient Details Name: Dennis [name]. MRN: [id number] DOB: [birth year] Today's Date: 12/26/2019    History of present illness Dennis [name]. is a 59 y.o. male presenting with acute onset left-sided weakness. PMH is significant for type 2 diabetes, CHF with implantable defibrillator, tobacco use, warfarin use. 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 Per chartProbably metastatic lung cancer to brain   OT comments  This 59 yo male admitted with above seen today to start LUE shoulder and elbow exercises. Pt initially had difficulty with the first one, but was able to get it with practice. Pt is very motivated to get better and get back to work. He will continue to benefit from acute OT with follow up OPOT.  Follow Up Recommendations  Outpatient OT    Equipment Recommendations  None recommended by OT       Precautions / Restrictions Precautions Precautions: None Restrictions Weight Bearing Restrictions: No       Mobility Bed Mobility Overal bed mobility: Independent                Transfers Overall transfer level: Independent                               Vision Patient Visual Report: No change from baseline            Cognition Arousal/Alertness: Awake/alert Behavior During Therapy: WFL for tasks assessed/performed Overall Cognitive Status: Within Functional Limits for tasks assessed                                          Exercises Other Exercises Other Exercises: Educated pt on Level 2 theraband for LUE horzontal AD/ABduction--pt had a difficult time hold and manging theraband so I tied loops in the end of it. Pt letting RUE do alot work for horizontal add/abduction so had him hold RUE stil at midline and only move LUE --pt had difficulty keeping RUE stationary while LUE did the work--but finally  got the hang of it mostly. Also educated by on diagonals with LUE holding theraband with RUE on right knee and extending LUE. Last exercise was a bicep curl with theraband around pt's left foot. Pt encouraged to do 10 reps of each of the three exercsies 2 more times before bed.           Pertinent Vitals/ Pain       Pain Assessment: No/denies pain         Frequency  Min 2X/week        Progress Toward Goals  OT Goals(current goals can now be found in the care plan section)  Progress towards OT goals: Progressing toward goals     Plan Discharge plan remains appropriate       AM-PAC OT "6 Clicks" Daily Activity     Outcome Measure   Help from another person eating meals?: None Help from another person taking care of personal grooming?: None Help from another person toileting, which includes using toliet, bedpan, or urinal?: None Help from another person bathing (including washing, rinsing, drying)?: None Help from another person to put on and taking off regular upper body clothing?: None Help from another person to put on and taking off regular lower body clothing?:  None 6 Click Score: 24    End of Session Equipment Utilized During Treatment: (none)  OT Visit Diagnosis: Other abnormalities of gait and mobility (R26.89);Muscle weakness (generalized) (M62.81)   Activity Tolerance Patient tolerated treatment well   Patient Left in bed;with call bell/phone within reach   Nurse Communication          Time: 1259-1330 OT Time Calculation (min): 31 min  Charges: OT General Charges $OT Visit: 1 Visit OT Treatments $Therapeutic Exercise: 23-37 mins Tye [place] , OTR/L [place] Pager [phone] Office [phone]      12/26/2019, 3:05 PM

## 2019-12-26 NOTE — Progress Notes (Signed)
Family Medicine Teaching Service Daily Progress Note Intern Pager: [phone]  Patient name: Dennis [name]. Medical record number: [id number] Date of birth: 21-Mar-1961 Age: 59 y.o. Gender: male  Primary Care Provider: System, Pcp Not In Consultants: None Code Status: Full  Pt Overview and Major Events to Date:  3/13 Admitted to [facility] with brain mass  Assessment and Plan: Divit Anes Rigel. is a 59 y.o. male presenting with acute onset left-sided weakness. PMH is significant for type 2 diabetes, CHF with implantable defibrillator, tobacco use, warfarin use  Cerebral mass presenting with L-sided weakness (improved), suspected mets with likely lung primary Discussed with patient his diagnosis and answered several questions as best I could without formal diagnosis. Plan for near future is to complete recommended clearance tests including echo, PFT, ABG. Following these results, patient will likely undergo lung tissue biopsy via pulmonology to send to pathology for further differentiation of lesions. If patient tolerates procedure well, I would expect him to be able to go home and follow up with oncology outpatient for treatment plan.  Echocardiogram is stable from previous echo with LVEF of 20-25% - f/u PFT, ABG - f/u pulmonology for biopsy - continue dexamethasone 6 mg p.o. twice daily for vasogenic edema control - PT/OT recommend outpatient PT and OT - oncology to f/u OP - continue heparin per pharm and consider switching from warfarin to doac s/p procedure  AKI- Cr elevated likely 2/2 media with CT scanning. Has been gradually improving. Cr 1.6>1.39 3/14. - continue to monitor on daily labs  Type 2 diabetes mellitus- CBGs ranged 197-69 overnight in setting of steroid use and poor control prior to admission. A1c 10.  Patient has been n.p.o. for procedure. - increase daily lantus from 20 to 25. Would also recommend transition to morning dosing eventually for improved safety profile -Monitor  CBGs -Continue Jardiance (canagloflozin per formulary) -Sliding scale insulin -Hold Metformin for contrast load  HFrEF with ICD, HTN- cards has been involved for risk stratification. Their recommendations are as follows: repeat echo (pending), BNP to establish baseline, continue hold entresto and spironolactone in setting of AKI but could consider restarting during hospitalization, heparin anticoag, continue steroids. Does not appear to be in decompensated HF as his VSS, no volume overload on exam and BNP only mildly elevated.  Echo 3/14 showed LVEF 20-25%, this is stable from previous echo. -Cardiology following, recommendations appreciated - continue home carvedilol - holding warfarin, entresto, sprinolactone  H/o stroke, HLD- Home meds include entresto, warfarin - per above  Tobacco use - counsel for tobacco cessation  FEN/GI: N.p.o. for tissue biopsy PPx: heparin   Disposition: pending lung tissue biopsy  Subjective:  Patient reports he is doing well this morning.  He slept well last night and has questions regarding if he will have the biopsy performed today.  He reports that he is hungry and would like something to eat.  No other concerns at this time.  Objective: Temp:  [97.9 F (36.6 C)-98.6 F (37 C)] 98 F (36.7 C) (03/15 0325) Pulse Rate:  [81-91] 81 (03/15 0325) Resp:  [16-19] 16 (03/15 0325) BP: (104-124)/(74-85) 122/82 (03/15 0325) SpO2:  [97 %-100 %] 100 % (03/15 0325)  Physical Exam:  General: Resting comfortably when I enter the room, no acute distress. Cardio: RRR no m/r/g Lungs: Clear to auscultation bilaterally, no wheezes appreciated Skin: warm and dry Extremities: No edema, left upper extremity strength improving. Neuro: speech clear  Laboratory: Recent Labs  Lab 12/23/19 1056 12/23/19 1056 12/23/19 1132 12/24/19 0411  12/25/19 0421  WBC 9.9  --   --  10.9* 14.3*  HGB 13.1   < > 14.3 12.7* 11.5*  HCT 42.8   < > 42.0 40.5 36.0*  PLT 280  --    --  288 277   < > = values in this interval not displayed.   Recent Labs  Lab 12/23/19 1056 12/23/19 1056 12/23/19 1132 12/24/19 0411 12/25/19 0421  NA 139   < > 139 139 138  K 4.6   < > 4.6 4.0 4.2  CL 107   < > 108 103 107  CO2 21*  --   --  23 24  BUN 25*   < > 27* 28* 34*  CREATININE 1.64*   < > 1.70* 1.60* 1.39*  CALCIUM 9.1  --   --  8.8* 8.8*  PROT 6.7  --   --   --   --   BILITOT 1.0  --   --   --   --   ALKPHOS 96  --   --   --   --   ALT 13  --   --   --   --   AST 18  --   --   --   --   GLUCOSE 135*   < > 132* 149* 169*   < > = values in this interval not displayed.     Imaging/Diagnostic Tests: ECHOCARDIOGRAM COMPLETE  Result Date: 12/25/2019    ECHOCARDIOGRAM REPORT   Patient Name:    [facility] Date of Exam: 12/25/2019 Medical Rec #:  [id number]   Height:       72.0 in Accession #:    [id number]  Weight:       170.0 lb Date of Birth:  [birth year]    BSA:          1.988 m Patient Age:    26 years    BP:           123/85 mmHg Patient Gender: M           HR:           90 bpm. Exam Location:  Inpatient Procedure: 2D Echo, Color Doppler and Cardiac Doppler Indications:    I42.9 Cardiomyopathy (unspecified)  History:        Patient has prior history of Echocardiogram examinations, most                 recent 11/02/2018. CHF, Pacemaker and Defibrillator; Risk                 Factors:Hypertension, Diabetes and Dyslipidemia.  Sonographer:    Raquel Sarna Senior RDCS Referring Phys: [place]  1. Left ventricular ejection fraction, by estimation, is 20 to 25%. The left ventricle has severely decreased function. The left ventricle demonstrates global hypokinesis. There is moderate asymmetric left ventricular hypertrophy of the inferolateral segment. Left ventricular diastolic parameters are indeterminate.  2. Right ventricular systolic function is normal. The right ventricular size is normal. Tricuspid regurgitation signal is inadequate for assessing PA pressure.  3.  Left atrial size was moderately dilated.  4. The mitral valve is normal in structure. Mild mitral valve regurgitation. No evidence of mitral stenosis.  5. The aortic valve is tricuspid. Aortic valve regurgitation is trivial. No aortic stenosis is present.  6. The inferior vena cava is dilated in size with <50% respiratory variability, suggesting right atrial pressure of 15 mmHg. FINDINGS  Left Ventricle: Left ventricular ejection  fraction, by estimation, is 20 to 25%. The left ventricle has severely decreased function. The left ventricle demonstrates global hypokinesis. The left ventricular internal cavity size was normal in size. There is moderate asymmetric left ventricular hypertrophy of the inferolateral segment. Left ventricular diastolic parameters are indeterminate. Right Ventricle: The right ventricular size is normal. No increase in right ventricular wall thickness. Right ventricular systolic function is normal. Tricuspid regurgitation signal is inadequate for assessing PA pressure. Left Atrium: Left atrial size was moderately dilated. Right Atrium: Right atrial size was normal in size. Pericardium: There is no evidence of pericardial effusion. Mitral Valve: The mitral valve is normal in structure. Normal mobility of the mitral valve leaflets. Mild mitral valve regurgitation. No evidence of mitral valve stenosis. Tricuspid Valve: The tricuspid valve is normal in structure. Tricuspid valve regurgitation is trivial. No evidence of tricuspid stenosis. Aortic Valve: The aortic valve is tricuspid. Aortic valve regurgitation is trivial. No aortic stenosis is present. Pulmonic Valve: The pulmonic valve was normal in structure. Pulmonic valve regurgitation is trivial. No evidence of pulmonic stenosis. Aorta: The aortic root is normal in size and structure. Venous: The inferior vena cava is dilated in size with less than 50% respiratory variability, suggesting right atrial pressure of 15 mmHg. IAS/Shunts: No atrial  level shunt detected by color flow Doppler. Additional Comments: A pacer wire is visualized.  LEFT VENTRICLE PLAX 2D LVIDd:         6.80 cm LVIDs:         5.90 cm LV PW:         1.40 cm LV IVS:        0.90 cm LVOT diam:     2.47 cm LV SV:         62 LV SV Index:   31 LVOT Area:     4.79 cm  LV Volumes (MOD) LV vol d, MOD A2C: 216.0 ml LV vol d, MOD A4C: 220.0 ml LV vol s, MOD A2C: 164.0 ml LV vol s, MOD A4C: 179.0 ml LV SV MOD A2C:     52.0 ml LV SV MOD A4C:     220.0 ml LV SV MOD BP:      54.3 ml RIGHT VENTRICLE RV S prime:     12.60 cm/s TAPSE (M-mode): 2.1 cm LEFT ATRIUM             Index       RIGHT ATRIUM           Index LA diam:        3.50 cm 1.76 cm/m  RA Area:     18.80 cm LA Vol (A2C):   85.7 ml 43.10 ml/m RA Volume:   47.80 ml  24.04 ml/m LA Vol (A4C):   76.6 ml 38.52 ml/m LA Biplane Vol: 83.6 ml 42.04 ml/m  AORTIC VALVE LVOT Vmax:   70.50 cm/s LVOT Vmean:  51.300 cm/s LVOT VTI:    0.130 m  AORTA Ao Root diam: 3.50 cm Ao Asc diam:  3.30 cm  SHUNTS Systemic VTI:  0.13 m Systemic Diam: 2.47 cm Cherlynn Kaiser MD Electronically signed by Cherlynn Kaiser MD Signature Date/Time: 12/25/2019/1:27:41 PM    Final      Gifford Shave, MD 12/26/2019, 6:08 AM PGY-1, [place] Intern pager: [phone], text pages welcome

## 2019-12-27 ENCOUNTER — Other Ambulatory Visit: Payer: Self-pay | Admitting: Radiation Therapy

## 2019-12-27 LAB — GLUCOSE, CAPILLARY
Glucose-Capillary: 140 mg/dL — ABNORMAL HIGH (ref 70–99)
Glucose-Capillary: 164 mg/dL — ABNORMAL HIGH (ref 70–99)
Glucose-Capillary: 275 mg/dL — ABNORMAL HIGH (ref 70–99)
Glucose-Capillary: 346 mg/dL — ABNORMAL HIGH (ref 70–99)

## 2019-12-27 LAB — PROTIME-INR
INR: 1.1 (ref 0.8–1.2)
Prothrombin Time: 14.5 seconds (ref 11.4–15.2)

## 2019-12-27 MED ORDER — SACUBITRIL-VALSARTAN 24-26 MG PO TABS
1.0000 | ORAL_TABLET | Freq: Two times a day (BID) | ORAL | Status: DC
  Administered 2019-12-27 (×2): 1 via ORAL
  Filled 2019-12-27 (×4): qty 1

## 2019-12-27 NOTE — Progress Notes (Addendum)
Progress Note Patient Name: Dennis [name]. Date of Encounter: 12/27/2019  Primary Cardiologist:  Glori Bickers, MD CHF  Subjective   Pt w/ no CP/SOB overnight, ready for procedure tomorrow. Reiterates that he will not smoke again  Inpatient Medications    Scheduled Meds: . atorvastatin  20 mg Oral q1800  . canagliflozin  100 mg Oral QAC breakfast  . carvedilol  9.375 mg Oral BID WC  . dexamethasone  4 mg Oral BID PC  . insulin aspart  0-5 Units Subcutaneous QHS  . insulin aspart  0-9 Units Subcutaneous TID WC  . insulin glargine  25 Units Subcutaneous Q2200  . pantoprazole  40 mg Oral Q0600   Continuous Infusions:   PRN Meds:    Vital Signs    Vitals:   12/26/19 2048 12/26/19 2332 12/27/19 0452 12/27/19 0820  BP: 112/80 127/89 125/90 (!) 141/89  Pulse: 85 86 77 81  Resp: 18 19 18 18   Temp: 98.2 F (36.8 C) 98 F (36.7 C) 98 F (36.7 C) 98.1 F (36.7 C)  TempSrc: Oral Oral Oral Oral  SpO2: 100% 100% 100% 100%  Weight:      Height:        Intake/Output Summary (Last 24 hours) at 12/27/2019 0951 Last data filed at 12/27/2019 0500 Gross per 24 hour  Intake --  Output 2000 ml  Net -2000 ml   Filed Weights   12/23/19 1037  Weight: 77.1 kg   Last Weight  Most recent update: 12/23/2019 10:37 AM    Weight  77.1 kg (170 lb)            Weight change:    Telemetry    SR, rare paced beats, rare PVCs - Personally Reviewed  ECG    03/12, SR, HR 88, LVH, biatrial enlargement, not acute - Personally Reviewed  Physical Exam   General: Well developed, well nourished, male in no acute distress Head: Eyes PERRLA, Head normocephalic and atraumatic Lungs: rare rhonchi bilaterally to auscultation. Heart: HRRR S1 S2, without rub or gallop. No murmur. 4/4 extremity pulses are 2+ & equal. No JVD. Abdomen: Bowel sounds are present, abdomen soft and non-tender without masses or  hernias noted. Msk: Normal strength and tone for age. Extremities: No clubbing,  cyanosis or edema.    Skin:  No rashes or lesions noted. Neuro: Alert and oriented X 3. Psych:  Good affect, responds appropriately  Labs    Hematology Recent Labs  Lab 12/23/19 1056 12/23/19 1056 12/23/19 1132 12/24/19 0411 12/25/19 0421  WBC 9.9  --   --  10.9* 14.3*  RBC 4.39  --   --  4.28 3.86*  HGB 13.1   < > 14.3 12.7* 11.5*  HCT 42.8   < > 42.0 40.5 36.0*  MCV 97.5  --   --  94.6 93.3  MCH 29.8  --   --  29.7 29.8  MCHC 30.6  --   --  31.4 31.9  RDW 13.5  --   --  13.4 13.6  PLT 280  --   --  288 277   < > = values in this interval not displayed.    Chemistry Recent Labs  Lab 12/23/19 1056 12/23/19 1056 12/23/19 1132 12/24/19 0411 12/25/19 0421  NA 139   < > 139 139 138  K 4.6   < > 4.6 4.0 4.2  CL 107   < > 108 103 107  CO2 21*  --   --  23  24  GLUCOSE 135*   < > 132* 149* 169*  BUN 25*   < > 27* 28* 34*  CREATININE 1.64*   < > 1.70* 1.60* 1.39*  CALCIUM 9.1  --   --  8.8* 8.8*  PROT 6.7  --   --   --   --   ALBUMIN 3.3*  --   --   --   --   AST 18  --   --   --   --   ALT 13  --   --   --   --   ALKPHOS 96  --   --   --   --   BILITOT 1.0  --   --   --   --   GFRNONAA 46*  --   --  47* 56*  GFRAA 53*  --   --  55* >60  ANIONGAP 11  --   --  13 7   < > = values in this interval not displayed.     High Sensitivity Troponin:  No results for input(s): TROPONINIHS in the last 720 hours.    BNPNo results for input(s): BNP, PROBNP in the last 168 hours.   Lab Results  Component Value Date   INR 1.1 12/27/2019   INR 1.6 (H) 12/26/2019   INR 2.8 (H) 12/25/2019     Radiology    CT HEAD WO CONTRAST  Result Date: 12/23/2019 CLINICAL DATA:  Left arm weakness since Monday or Tuesday. Fall on Wednesday night. EXAM: CT HEAD WITHOUT CONTRAST TECHNIQUE: Contiguous axial images were obtained from the base of the skull through the vertex without intravenous contrast. COMPARISON:  01/24/2015 FINDINGS: Brain: 14 mm dense intra-axial mass with moderate  vasogenic edema in the high right frontal lobe. Remote infarct in the right frontal operculum and insula. No acute hemorrhage or hydrocephalus. No acute infarct. Vascular: No hyperdense vessel. Skull: No evidence of fracture or bone lesion Sinuses/Orbits: Negative IMPRESSION: 1. 14 mm right frontal mass with vasogenic edema. Recommend brain MRI with contrast. 2. Chronic right insular and opercular infarct. Electronically Signed   By: Monte Fantasia M.D.   On: 12/23/2019 11:16   CT HEAD W CONTRAST  Result Date: 12/23/2019 CLINICAL DATA:  Cerebral mass. EXAM: CT HEAD WITH CONTRAST TECHNIQUE: Contiguous axial images were obtained from the base of the skull through the vertex with intravenous contrast. CONTRAST:  152mL OMNIPAQUE IOHEXOL 300 MG/ML  SOLN COMPARISON:  CT head without contrast 12/23/2019 FINDINGS: Brain: Postcontrast images confirm a homogeneously enhancing mass lesion in the posterior right frontal lobe measuring 1.4 x 1.5 x 1.5 cm. Surrounding vasogenic edema is present with partial effacement of sulci. No significant midline shift is present. No other foci of enhancement are present. Remote infarcts involving the right insular cortex as well as the anterior frontal lobe and precentral gyrus are again noted. The brainstem and cerebellum are within normal limits. The left hemisphere is unremarkable. Vascular: Contrast is present in the major vascular structures. Skull: Calvarium is intact. No focal lytic or blastic lesions are present. No significant extracranial soft tissue lesion is present. Sinuses/Orbits: The paranasal sinuses and mastoid air cells are clear. The globes and orbits are within normal limits. IMPRESSION: 1. 1.4 x 1.5 x 1.5 cm homogeneously enhancing mass lesion in the posterior right frontal lobe with surrounding vasogenic edema and partial effacement of sulci. This most likely represents a solitary metastasis. Primary brain tumor is not excluded. 2. No significant midline shift. 3.  Remote infarcts involving the right insular cortex and anterior frontal lobe and precentral gyrus. Electronically Signed   By: San Morelle M.D.   On: 12/23/2019 19:12   CT CHEST W CONTRAST  Result Date: 12/23/2019 CLINICAL DATA:  Inpatient. Right frontal lobe cerebral mass with vasogenic edema on head CT. Assess for primary neoplasm. EXAM: CT CHEST, ABDOMEN, AND PELVIS WITH CONTRAST TECHNIQUE: Multidetector CT imaging of the chest, abdomen and pelvis was performed following the standard protocol during bolus administration of intravenous contrast. CONTRAST:  158mL OMNIPAQUE IOHEXOL 300 MG/ML  SOLN COMPARISON:  06/25/2011 chest CT angiogram and 06/26/2011 CT abdomen. FINDINGS: CT CHEST FINDINGS Cardiovascular: Normal heart size. No significant pericardial effusion/thickening. Two lead left subclavian ICD is noted with lead tips in the right atrium and right ventricular apex. Left anterior descending coronary atherosclerosis. Atherosclerotic nonaneurysmal thoracic aorta. Normal caliber pulmonary arteries. No central pulmonary emboli. Mediastinum/Nodes: No discrete thyroid nodules. Unremarkable esophagus. No axillary adenopathy. Mildly enlarged 1.1 cm aortopulmonary node (series 3/image 23), new since 2012. No additional pathologically enlarged mediastinal nodes. No pathologically enlarged hilar nodes. Lungs/Pleura: No pneumothorax. No pleural effusion. Moderate centrilobular and paraseptal emphysema. No acute consolidative airspace disease or lung masses. Irregular spiculated posterior right middle lobe 1.5 x 0.7 cm pulmonary nodule abutting and distorting the right major fissure (series 5/image 97), which may represent growth of a 0.5 cm nodule seen in this location on 06/24/2011 chest CT. No additional significant pulmonary nodules. Parenchymal banding at the dependent left lung base compatible with nonspecific postinfectious/postinflammatory scarring. Musculoskeletal: No aggressive appearing focal  osseous lesions. Moderate symmetric bilateral gynecomastia is unchanged. CT ABDOMEN PELVIS FINDINGS Hepatobiliary: Normal liver with no liver mass. Cholecystectomy. No biliary ductal dilatation. Pancreas: Normal, with no mass or duct dilation. Spleen: Normal size. No mass. Adrenals/Urinary Tract: Right adrenal 3.2 cm nodule with density 45 HU minimally increased from 2.8 cm on 06/24/2011 CT. No left adrenal nodules. No hydronephrosis. Subcentimeter hypodense renal cortical lesion in the upper right kidney is too small to characterize and requires no follow-up. Scattered mild renal cortical scarring in the kidneys bilaterally. Normal bladder. Stomach/Bowel: Normal non-distended stomach. Normal caliber small bowel with no small bowel wall thickening. Appendix not discretely visualized. Normal large bowel with no diverticulosis, large bowel wall thickening or pericolonic fat stranding. Vascular/Lymphatic: Atherosclerotic nonaneurysmal abdominal aorta. Patent portal, splenic, hepatic and renal veins. No pathologically enlarged lymph nodes in the abdomen or pelvis. Reproductive: Top-normal size prostate. Other: No pneumoperitoneum, ascites or focal fluid collection. Musculoskeletal: No aggressive appearing focal osseous lesions. Healed deformity in the lateral left iliac wing. Moderate degenerative disc disease at L5-S1 with unilateral right L5 pars defect. IMPRESSION: 1. Spiculated 1.5 x 0.7 cm posterior right middle lobe pulmonary nodule abutting and distorting the right major fissure, suspicious for primary bronchogenic carcinoma. 2. Solitary mildly enlarged aortopulmonary mediastinal lymph node. Metastatic disease not excluded. 3. Indeterminate right adrenal 3.2 cm nodule, mildly increased since 2012 CT, more likely a slow growing adenoma. 4. Suggest short-term outpatient PET-CT for further staging evaluation. 5. Aortic Atherosclerosis (ICD10-I70.0) and Emphysema (ICD10-J43.9). Electronically Signed   By: Ilona Sorrel M.D.   On: 12/23/2019 18:57   CT ABDOMEN PELVIS W CONTRAST  Result Date: 12/23/2019 CLINICAL DATA:  Inpatient. Right frontal lobe cerebral mass with vasogenic edema on head CT. Assess for primary neoplasm. EXAM: CT CHEST, ABDOMEN, AND PELVIS WITH CONTRAST TECHNIQUE: Multidetector CT imaging of the chest, abdomen and pelvis was performed following the standard protocol during bolus administration of intravenous contrast. CONTRAST:  149mL OMNIPAQUE IOHEXOL 300 MG/ML  SOLN COMPARISON:  06/25/2011 chest CT angiogram and 06/26/2011 CT abdomen. FINDINGS: CT CHEST FINDINGS Cardiovascular: Normal heart size. No significant pericardial effusion/thickening. Two lead left subclavian ICD is noted with lead tips in the right atrium and right ventricular apex. Left anterior descending coronary atherosclerosis. Atherosclerotic nonaneurysmal thoracic aorta. Normal caliber pulmonary arteries. No central pulmonary emboli. Mediastinum/Nodes: No discrete thyroid nodules. Unremarkable esophagus. No axillary adenopathy. Mildly enlarged 1.1 cm aortopulmonary node (series 3/image 23), new since 2012. No additional pathologically enlarged mediastinal nodes. No pathologically enlarged hilar nodes. Lungs/Pleura: No pneumothorax. No pleural effusion. Moderate centrilobular and paraseptal emphysema. No acute consolidative airspace disease or lung masses. Irregular spiculated posterior right middle lobe 1.5 x 0.7 cm pulmonary nodule abutting and distorting the right major fissure (series 5/image 97), which may represent growth of a 0.5 cm nodule seen in this location on 06/24/2011 chest CT. No additional significant pulmonary nodules. Parenchymal banding at the dependent left lung base compatible with nonspecific postinfectious/postinflammatory scarring. Musculoskeletal: No aggressive appearing focal osseous lesions. Moderate symmetric bilateral gynecomastia is unchanged. CT ABDOMEN PELVIS FINDINGS Hepatobiliary: Normal liver with no  liver mass. Cholecystectomy. No biliary ductal dilatation. Pancreas: Normal, with no mass or duct dilation. Spleen: Normal size. No mass. Adrenals/Urinary Tract: Right adrenal 3.2 cm nodule with density 45 HU minimally increased from 2.8 cm on 06/24/2011 CT. No left adrenal nodules. No hydronephrosis. Subcentimeter hypodense renal cortical lesion in the upper right kidney is too small to characterize and requires no follow-up. Scattered mild renal cortical scarring in the kidneys bilaterally. Normal bladder. Stomach/Bowel: Normal non-distended stomach. Normal caliber small bowel with no small bowel wall thickening. Appendix not discretely visualized. Normal large bowel with no diverticulosis, large bowel wall thickening or pericolonic fat stranding. Vascular/Lymphatic: Atherosclerotic nonaneurysmal abdominal aorta. Patent portal, splenic, hepatic and renal veins. No pathologically enlarged lymph nodes in the abdomen or pelvis. Reproductive: Top-normal size prostate. Other: No pneumoperitoneum, ascites or focal fluid collection. Musculoskeletal: No aggressive appearing focal osseous lesions. Healed deformity in the lateral left iliac wing. Moderate degenerative disc disease at L5-S1 with unilateral right L5 pars defect. IMPRESSION: 1. Spiculated 1.5 x 0.7 cm posterior right middle lobe pulmonary nodule abutting and distorting the right major fissure, suspicious for primary bronchogenic carcinoma. 2. Solitary mildly enlarged aortopulmonary mediastinal lymph node. Metastatic disease not excluded. 3. Indeterminate right adrenal 3.2 cm nodule, mildly increased since 2012 CT, more likely a slow growing adenoma. 4. Suggest short-term outpatient PET-CT for further staging evaluation. 5. Aortic Atherosclerosis (ICD10-I70.0) and Emphysema (ICD10-J43.9). Electronically Signed   By: Ilona Sorrel M.D.   On: 12/23/2019 18:57   ECHOCARDIOGRAM COMPLETE  Result Date: 12/25/2019    ECHOCARDIOGRAM REPORT   Patient Name:   [facility] Date of Exam: 12/25/2019 Medical Rec #:  [id number]   Height:       72.0 in Accession #:    [id number]  Weight:       170.0 lb Date of Birth:  03/04/1961    BSA:          1.988 m Patient Age:    28 years    BP:           123/85 mmHg Patient Gender: M           HR:           90 bpm. Exam Location:  Inpatient Procedure: 2D Echo, Color Doppler and Cardiac Doppler Indications:    I42.9 Cardiomyopathy (unspecified)  History:  Patient has prior history of Echocardiogram examinations, most                 recent 11/02/2018. CHF, Pacemaker and Defibrillator; Risk                 Factors:Hypertension, Diabetes and Dyslipidemia.  Sonographer:    Raquel Sarna Senior RDCS Referring Phys: [place]  1. Left ventricular ejection fraction, by estimation, is 20 to 25%. The left ventricle has severely decreased function. The left ventricle demonstrates global hypokinesis. There is moderate asymmetric left ventricular hypertrophy of the inferolateral segment. Left ventricular diastolic parameters are indeterminate.  2. Right ventricular systolic function is normal. The right ventricular size is normal. Tricuspid regurgitation signal is inadequate for assessing PA pressure.  3. Left atrial size was moderately dilated.  4. The mitral valve is normal in structure. Mild mitral valve regurgitation. No evidence of mitral stenosis.  5. The aortic valve is tricuspid. Aortic valve regurgitation is trivial. No aortic stenosis is present.  6. The inferior vena cava is dilated in size with <50% respiratory variability, suggesting right atrial pressure of 15 mmHg. FINDINGS  Left Ventricle: Left ventricular ejection fraction, by estimation, is 20 to 25%. The left ventricle has severely decreased function. The left ventricle demonstrates global hypokinesis. The left ventricular internal cavity size was normal in size. There is moderate asymmetric left ventricular hypertrophy of the inferolateral segment. Left ventricular  diastolic parameters are indeterminate. Right Ventricle: The right ventricular size is normal. No increase in right ventricular wall thickness. Right ventricular systolic function is normal. Tricuspid regurgitation signal is inadequate for assessing PA pressure. Left Atrium: Left atrial size was moderately dilated. Right Atrium: Right atrial size was normal in size. Pericardium: There is no evidence of pericardial effusion. Mitral Valve: The mitral valve is normal in structure. Normal mobility of the mitral valve leaflets. Mild mitral valve regurgitation. No evidence of mitral valve stenosis. Tricuspid Valve: The tricuspid valve is normal in structure. Tricuspid valve regurgitation is trivial. No evidence of tricuspid stenosis. Aortic Valve: The aortic valve is tricuspid. Aortic valve regurgitation is trivial. No aortic stenosis is present. Pulmonic Valve: The pulmonic valve was normal in structure. Pulmonic valve regurgitation is trivial. No evidence of pulmonic stenosis. Aorta: The aortic root is normal in size and structure. Venous: The inferior vena cava is dilated in size with less than 50% respiratory variability, suggesting right atrial pressure of 15 mmHg. IAS/Shunts: No atrial level shunt detected by color flow Doppler. Additional Comments: A pacer wire is visualized.  LEFT VENTRICLE PLAX 2D LVIDd:         6.80 cm LVIDs:         5.90 cm LV PW:         1.40 cm LV IVS:        0.90 cm LVOT diam:     2.47 cm LV SV:         62 LV SV Index:   31 LVOT Area:     4.79 cm  LV Volumes (MOD) LV vol d, MOD A2C: 216.0 ml LV vol d, MOD A4C: 220.0 ml LV vol s, MOD A2C: 164.0 ml LV vol s, MOD A4C: 179.0 ml LV SV MOD A2C:     52.0 ml LV SV MOD A4C:     220.0 ml LV SV MOD BP:      54.3 ml RIGHT VENTRICLE RV S prime:     12.60 cm/s TAPSE (M-mode): 2.1 cm LEFT ATRIUM  Index       RIGHT ATRIUM           Index LA diam:        3.50 cm 1.76 cm/m  RA Area:     18.80 cm LA Vol (A2C):   85.7 ml 43.10 ml/m RA Volume:    47.80 ml  24.04 ml/m LA Vol (A4C):   76.6 ml 38.52 ml/m LA Biplane Vol: 83.6 ml 42.04 ml/m  AORTIC VALVE LVOT Vmax:   70.50 cm/s LVOT Vmean:  51.300 cm/s LVOT VTI:    0.130 m  AORTA Ao Root diam: 3.50 cm Ao Asc diam:  3.30 cm  SHUNTS Systemic VTI:  0.13 m Systemic Diam: 2.47 cm Cherlynn Kaiser MD Electronically signed by Cherlynn Kaiser MD Signature Date/Time: 12/25/2019/1:27:41 PM    Final      Cardiac Studies   ECHO:  12/25/2019  1. Left ventricular ejection fraction, by estimation, is 20 to 25%. The  left ventricle has severely decreased function. The left ventricle  demonstrates global hypokinesis. There is moderate asymmetric left  ventricular hypertrophy of the inferolateral  segment. Left ventricular diastolic parameters are indeterminate.   2. Right ventricular systolic function is normal. The right ventricular  size is normal. Tricuspid regurgitation signal is inadequate for assessing  PA pressure.   3. Left atrial size was moderately dilated.   4. The mitral valve is normal in structure. Mild mitral valve  regurgitation. No evidence of mitral stenosis.   5. The aortic valve is tricuspid. Aortic valve regurgitation is trivial.  No aortic stenosis is present.   6. The inferior vena cava is dilated in size with <50% respiratory  variability, suggesting right atrial pressure of 15 mmHg.   Patient Profile     59 y.o. 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 Wt 74 kg at last CHF visit, on Coreg, Entresto, spiro.  Admitted 03/12 w/ ?CVA, but had 1.5 cm R cerebral mass, 1.5 cm spiculated RML lung lesion ?[place] lung CA.  Assessment & Plan    1. ?[place] Lung CA - plan is for electro navigational bronchoscopy with endobronchial ultrasound tomorrow - respiratory status is good now - cardiac stable for the procedure - he wants to be aggressive with  this  2. S-CHF - pt feels resp are at baseline - no current need for diuresis - will order daily wts - if wt trends up, may need Lasix - on home dose Coreg - restart Entresto at 24-26 and see how tolerated - pta on Entresto 97-103 and spiro 25 mg - if tolerates well, consider spiro 12.5 mg qd tomorrow  4. AKI - BUN/Cr 25/1.64 on admit - trending down after dye studies - baseline Cr 1.2-1.3  5. HTN - BP starting to trend back up - on home dose Coreg - restart decreased dose Entresto - add back spiro soon.  Otherwise, per IM Principal Problem:   Brain mass Active Problems:   Type II diabetes mellitus with ophthalmic manifestations ([place])   Long term (current) use of anticoagulants   Cardiomyopathy, ischemic   Automatic implantable cardioverter-defibrillator in situ   Chronic systolic heart failure ([place])   Hyperlipidemia associated with type 2 diabetes mellitus ([place])   Essential hypertension   Left hemiplegia (H[facility])   History of ischemic right MCA stroke   Vasogenic cerebral edema (H[facility])   Left leg paresthesias   Mass of  upper lobe of right lung    Signed, Rosaria Ferries , PA-C 9:51 AM 12/27/2019 Pager: [phone]  I have seen and examined the patient along with Rosaria Ferries , PA-C.  I have reviewed the chart, notes and new data.  I agree with PA/NP's note.  Key new complaints: no CV complaints, very mild dyspnea with walking Key examination changes: BP higher Key new findings / data: creat 1.39 yesterday, almost at baseline.   PLAN: Gradually restart Entresto and spironolactone while monitoring labs. EM-navigation EBUS tomorrow should not interfere with ICD function.  Sanda Klein, MD, [place] [phone] 12/27/2019, 11:56 AM

## 2019-12-27 NOTE — TOC Initial Note (Signed)
Transition of Care (TOC) - Initial/Assessment Note    Patient Details  Name: Dennis [name]. MRN: [id number] Date of Birth: 1961-03-31  Transition of Care [facility]) CM/SW Contact:    Pollie Friar, RN Phone Number: 12/27/2019, 1:33 PM  Clinical Narrative:                 Pt with recommendations for outpatient therapy. Pt asking to attend [facility]. Orders in Epic and information on the AVS.  Pt feels he has friends and family that will provide intermittent supervision once home.  PCP: [facility] in [place] following for further d/c needs.   Expected Discharge Plan: OP Rehab Barriers to Discharge: Continued Medical Work up   Patient Goals and CMS Choice     Choice offered to / list presented to : Patient  Expected Discharge Plan and Services Expected Discharge Plan: OP Rehab   Discharge Planning Services: CM Consult   Living arrangements for the past 2 months: Single Family Home                                      Prior Living Arrangements/Services Living arrangements for the past 2 months: Single Family Home Lives with:: Self Patient language and need for interpreter reviewed:: Yes Do you feel safe going back to the place where you live?: Yes      Need for Family Participation in Patient Care: Yes (Comment)(intermittent) Care giver support system in place?: Yes (comment)   Criminal Activity/Legal Involvement Pertinent to Current Situation/Hospitalization: No - Comment as needed  Activities of Daily Living Home Assistive Devices/Equipment: None ADL Screening (condition at time of admission) Patient's cognitive ability adequate to safely complete daily activities?: Yes Is the patient deaf or have difficulty hearing?: No Does the patient have difficulty seeing, even when wearing glasses/contacts?: No Does the patient have difficulty concentrating, remembering, or making decisions?: No Patient able to express need for assistance  with ADLs?: Yes Does the patient have difficulty dressing or bathing?: No Independently performs ADLs?: Yes (appropriate for developmental age) Does the patient have difficulty walking or climbing stairs?: No Weakness of Legs: None Weakness of Arms/Hands: None  Permission Sought/Granted                  Emotional Assessment Appearance:: Appears stated age Attitude/Demeanor/Rapport: Gracious Affect (typically observed): Accepting Orientation: : Oriented to Self, Oriented to Place, Oriented to  Time, Oriented to Situation   Psych Involvement: No (comment)  Admission diagnosis:  Brain mass [G93.89] Patient Active Problem List   Diagnosis Date Noted  . Mass of upper lobe of right lung   . Brain mass 12/23/2019  . Vasogenic cerebral edema ([place]) 12/23/2019  . Left leg paresthesias   . History of ischemic right MCA stroke   . Left-sided weakness   . 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[place])   . Tobacco abuse 01/24/2015  . Essential hypertension 01/24/2015  . Left hemiplegia ([place]) 01/24/2015  . Hyperlipidemia associated with type 2 diabetes mellitus ([place]) 11/30/2014  . Chronic systolic heart failure ([place]) 06/20/2014  . Automatic implantable cardioverter-defibrillator in situ   . Long term (current) use of anticoagulants 08/09/2013  . Drug abuse and dependence ([place])   . Cardiomyopathy, ischemic 07/21/2011  . Type II diabetes mellitus with ophthalmic manifestations ([place]) 09/13/2007   PCP:  System, Pcp Not In Pharmacy:   [place], [place] -  [address] [place] [address] Phone: [phone] Fax: [phone]     Social Determinants of Health (SDOH) Interventions    Readmission Risk Interventions No flowsheet data found.

## 2019-12-27 NOTE — Progress Notes (Signed)
Family Medicine Teaching Service Daily Progress Note Intern Pager: [phone]  Patient name: Dennis [name]. Medical record number: [id number] Date of birth: 09-24-1961 Age: 59 y.o. Gender: male  Primary Care Provider: System, Pcp Not In Consultants: None Code Status: Full  Pt Overview and Major Events to Date:  3/13 Admitted to [place] with brain mass  Assessment and Plan: Dennis Giovanie Lefebre. is a 59 y.o. male presenting with acute onset left-sided weakness. PMH is significant for type 2 diabetes, CHF with implantable defibrillator, tobacco use, warfarin use  Cerebral mass presenting with L-sided weakness (improved), suspected mets with likely lung primary Patient planning on lung biopsy with pulmonology tomorrow at 130.  He has been cleared by cardiology for this procedure.  His INR has been tracked and it is now 1.1 putting him below the 1.5 threshold making him eligible for the biopsy.. If patient tolerates procedure well, I would expect him to be able to go home and follow up with oncology outpatient for treatment plan.  Echocardiogram is stable from previous echo with LVEF of 20-25% - f/u pulmonology for biopsy -Dexamethasone decreased from 6 mg to 4 mg p.o. twice daily for vasogenic edema control - PT/OT recommend outpatient PT and OT - oncology to f/u OP - continue heparin per pharm and consider switching from warfarin to doac s/p procedure  AKI- Cr elevated likely 2/2 media with CT scanning. Has been gradually improving. Cr 1.6>1.39 3/14. -Repeat BMP tomorrow morning  Type 2 diabetes mellitus- CBGs ranged 197-69 overnight in setting of steroid use and poor control prior to admission. A1c 10.  Patient has been n.p.o. for procedure. - increase daily lantus from 20 to 25. Would also recommend transition to morning dosing eventually for improved safety profile -Monitor CBGs -Continue Jardiance (canagloflozin per formulary) -Sliding scale insulin -Hold Metformin for contrast load  HFrEF  with ICD, HTN- cards has been involved for risk stratification. Their recommendations are as follows: repeat echo (pending), BNP to establish baseline, continue hold entresto and spironolactone in setting of AKI but could consider restarting during hospitalization, heparin anticoag, continue steroids. Does not appear to be in decompensated HF as his VSS, no volume overload on exam and BNP only mildly elevated.  Echo 3/14 showed LVEF 20-25%, this is stable from previous echo. -Cardiology following, recommendations appreciated - continue home carvedilol - holding warfarin, entresto, sprinolactone  H/o stroke, HLD- Home meds include entresto, warfarin - per above  Tobacco use - counsel for tobacco cessation  FEN/GI: Regular diet PPx: heparin   Disposition: pending lung tissue biopsy  Subjective:  Patient is doing well this morning and says that he is ready to find out his diagnosis.  He would also like to take a shower this morning if possible.  Objective: Temp:  [97.4 F (36.3 C)-98.2 F (36.8 C)] 98 F (36.7 C) (03/16 0452) Pulse Rate:  [77-86] 77 (03/16 0452) Resp:  [18-19] 18 (03/16 0452) BP: (111-127)/(80-90) 125/90 (03/16 0452) SpO2:  [99 %-100 %] 100 % (03/16 0452)  Physical Exam:  General: No acute distress, sleeping when I am in the room.  Easy to arouse Cardio: RRR no m/r/g  Lungs: No wheezes noted.  Clear to auscultation bilaterally Skin: warm and dry Extremities: No edema, 5/5 strength in left upper and lower extremity Neuro: speech clear  Laboratory: Recent Labs  Lab 12/23/19 1056 12/23/19 1056 12/23/19 1132 12/24/19 0411 12/25/19 0421  WBC 9.9  --   --  10.9* 14.3*  HGB 13.1   < > 14.3  12.7* 11.5*  HCT 42.8   < > 42.0 40.5 36.0*  PLT 280  --   --  288 277   < > = values in this interval not displayed.   Recent Labs  Lab 12/23/19 1056 12/23/19 1056 12/23/19 1132 12/24/19 0411 12/25/19 0421  NA 139   < > 139 139 138  K 4.6   < > 4.6 4.0 4.2  CL  107   < > 108 103 107  CO2 21*  --   --  23 24  BUN 25*   < > 27* 28* 34*  CREATININE 1.64*   < > 1.70* 1.60* 1.39*  CALCIUM 9.1  --   --  8.8* 8.8*  PROT 6.7  --   --   --   --   BILITOT 1.0  --   --   --   --   ALKPHOS 96  --   --   --   --   ALT 13  --   --   --   --   AST 18  --   --   --   --   GLUCOSE 135*   < > 132* 149* 169*   < > = values in this interval not displayed.     Imaging/Diagnostic Tests: ECHOCARDIOGRAM COMPLETE  Result Date: 12/25/2019    ECHOCARDIOGRAM REPORT   Patient Name:   Dennis [name] Date of Exam: 12/25/2019 Medical Rec #:  [id number]   Height:       72.0 in Accession #:    [id number]  Weight:       170.0 lb Date of Birth:  Feb 20, 1961    BSA:          1.988 m Patient Age:    51 years    BP:           123/85 mmHg Patient Gender: M           HR:           90 bpm. Exam Location:  Inpatient Procedure: 2D Echo, Color Doppler and Cardiac Doppler Indications:    I42.9 Cardiomyopathy (unspecified)  History:        Patient has prior history of Echocardiogram examinations, most                 recent 11/02/2018. CHF, Pacemaker and Defibrillator; Risk                 Factors:Hypertension, Diabetes and Dyslipidemia.  Sonographer:    Raquel Sarna Senior RDCS Referring Phys: [place]  1. Left ventricular ejection fraction, by estimation, is 20 to 25%. The left ventricle has severely decreased function. The left ventricle demonstrates global hypokinesis. There is moderate asymmetric left ventricular hypertrophy of the inferolateral segment. Left ventricular diastolic parameters are indeterminate.  2. Right ventricular systolic function is normal. The right ventricular size is normal. Tricuspid regurgitation signal is inadequate for assessing PA pressure.  3. Left atrial size was moderately dilated.  4. The mitral valve is normal in structure. Mild mitral valve regurgitation. No evidence of mitral stenosis.  5. The aortic valve is tricuspid. Aortic valve regurgitation is  trivial. No aortic stenosis is present.  6. The inferior vena cava is dilated in size with <50% respiratory variability, suggesting right atrial pressure of 15 mmHg. FINDINGS  Left Ventricle: Left ventricular ejection fraction, by estimation, is 20 to 25%. The left ventricle has severely decreased function. The left ventricle demonstrates global hypokinesis. The  left ventricular internal cavity size was normal in size. There is moderate asymmetric left ventricular hypertrophy of the inferolateral segment. Left ventricular diastolic parameters are indeterminate. Right Ventricle: The right ventricular size is normal. No increase in right ventricular wall thickness. Right ventricular systolic function is normal. Tricuspid regurgitation signal is inadequate for assessing PA pressure. Left Atrium: Left atrial size was moderately dilated. Right Atrium: Right atrial size was normal in size. Pericardium: There is no evidence of pericardial effusion. Mitral Valve: The mitral valve is normal in structure. Normal mobility of the mitral valve leaflets. Mild mitral valve regurgitation. No evidence of mitral valve stenosis. Tricuspid Valve: The tricuspid valve is normal in structure. Tricuspid valve regurgitation is trivial. No evidence of tricuspid stenosis. Aortic Valve: The aortic valve is tricuspid. Aortic valve regurgitation is trivial. No aortic stenosis is present. Pulmonic Valve: The pulmonic valve was normal in structure. Pulmonic valve regurgitation is trivial. No evidence of pulmonic stenosis. Aorta: The aortic root is normal in size and structure. Venous: The inferior vena cava is dilated in size with less than 50% respiratory variability, suggesting right atrial pressure of 15 mmHg. IAS/Shunts: No atrial level shunt detected by color flow Doppler. Additional Comments: A pacer wire is visualized.  LEFT VENTRICLE PLAX 2D LVIDd:         6.80 cm LVIDs:         5.90 cm LV PW:         1.40 cm LV IVS:        0.90 cm LVOT  diam:     2.47 cm LV SV:         62 LV SV Index:   31 LVOT Area:     4.79 cm  LV Volumes (MOD) LV vol d, MOD A2C: 216.0 ml LV vol d, MOD A4C: 220.0 ml LV vol s, MOD A2C: 164.0 ml LV vol s, MOD A4C: 179.0 ml LV SV MOD A2C:     52.0 ml LV SV MOD A4C:     220.0 ml LV SV MOD BP:      54.3 ml RIGHT VENTRICLE RV S prime:     12.60 cm/s TAPSE (M-mode): 2.1 cm LEFT ATRIUM             Index       RIGHT ATRIUM           Index LA diam:        3.50 cm 1.76 cm/m  RA Area:     18.80 cm LA Vol (A2C):   85.7 ml 43.10 ml/m RA Volume:   47.80 ml  24.04 ml/m LA Vol (A4C):   76.6 ml 38.52 ml/m LA Biplane Vol: 83.6 ml 42.04 ml/m  AORTIC VALVE LVOT Vmax:   70.50 cm/s LVOT Vmean:  51.300 cm/s LVOT VTI:    0.130 m  AORTA Ao Root diam: 3.50 cm Ao Asc diam:  3.30 cm  SHUNTS Systemic VTI:  0.13 m Systemic Diam: 2.47 cm Cherlynn Kaiser MD Electronically signed by Cherlynn Kaiser MD Signature Date/Time: 12/25/2019/1:27:41 PM    Final      Gifford Shave, MD 12/27/2019, 5:51 AM PGY-1, [place] Intern pager: [phone], text pages welcome

## 2019-12-27 NOTE — Progress Notes (Signed)
total

## 2019-12-27 NOTE — Progress Notes (Addendum)
Physical Therapy Treatment Patient Details Name: Dennis [name]. MRN: [id number] DOB: 22-Feb-1961 Today's Date: 12/27/2019    History of Present Illness Dennis [name]. is a 59 y.o. male presenting with acute onset left-sided weakness. PMH is significant for type 2 diabetes, CHF with implantable defibrillator, tobacco use, warfarin use. 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 Per chartProbably metastatic lung cancer to brain    PT Comments    Patient is making progress toward PT goals and tolerated increased mobility well. Pt does continue to present with L UE/LE weakness and gait deviations. Continue to recommend Outpatient Neuro PT for further skilled PT services to maximize independence and safety with mobility.     Follow Up Recommendations  Outpatient PT;Supervision - Intermittent     Equipment Recommendations  None recommended by PT    Recommendations for Other Services       Precautions / Restrictions Precautions Precautions: None Restrictions Weight Bearing Restrictions: No    Mobility  Bed Mobility Overal bed mobility: Independent                Transfers Overall transfer level: Independent Equipment used: None                Ambulation/Gait Ambulation/Gait assistance: Modified independent (Device/Increase time) Gait Distance (Feet): 400 Feet Assistive device: None Gait Pattern/deviations: Step-through pattern;Trendelenburg Gait velocity: decreased   General Gait Details: pt with grossly steady gait and no LOB with challenges; L genu recurvatum in stance phase; L LE weakness that becomes more prevalent with increased gait speed; no arm swing L UE    Stairs             Wheelchair Mobility    Modified Rankin (Stroke Patients Only)       Balance                             High level balance activites: Backward  walking;Direction changes;Turns;Sudden stops;Head turns High Level Balance Comments: no LOB with high level balance activities  Standardized Balance Assessment Standardized Balance Assessment : Dynamic Gait Index   Dynamic Gait Index Level Surface: Mild Impairment Change in Gait Speed: Mild Impairment Gait with Horizontal Head Turns: Mild Impairment Gait with Vertical Head Turns: Mild Impairment Gait and Pivot Turn: Normal Step Over Obstacle: Normal Step Around Obstacles: Normal Steps: Mild Impairment Total Score: 19      Cognition Arousal/Alertness: Awake/alert Behavior During Therapy: WFL for tasks assessed/performed Overall Cognitive Status: Within Functional Limits for tasks assessed                                        Exercises      General Comments        Pertinent Vitals/Pain Pain Assessment: No/denies pain    Home Living                      Prior Function            PT Goals (current goals can now be found in the care plan section) Progress towards PT goals: Progressing toward goals    Frequency    Min 3X/week      PT Plan Current plan remains appropriate    Co-evaluation  AM-PAC PT "6 Clicks" Mobility   Outcome Measure  Help needed turning from your back to your side while in a flat bed without using bedrails?: None Help needed moving from lying on your back to sitting on the side of a flat bed without using bedrails?: None Help needed moving to and from a bed to a chair (including a wheelchair)?: None Help needed standing up from a chair using your arms (e.g., wheelchair or bedside chair)?: None Help needed to walk in hospital room?: None Help needed climbing 3-5 steps with a railing? : A Little 6 Click Score: 23    End of Session   Activity Tolerance: Patient tolerated treatment well Patient left: in bed;with call bell/phone within reach Nurse Communication: Mobility status PT Visit  Diagnosis: Unsteadiness on feet (R26.81);Hemiplegia and hemiparesis;Muscle weakness (generalized) (M62.81) Hemiplegia - Right/Left: Left Hemiplegia - caused by: Other cerebrovascular disease     Time: 0865-7846 PT Time Calculation (min) (ACUTE ONLY): 17 min  Charges:  $gait training: 8-22 mins                     Earney Navy, PTA Acute Rehabilitation Services Pager: [phone] Office: [phone]     Darliss Cheney 12/27/2019, 4:54 PM

## 2019-12-27 NOTE — Care Management Important Message (Signed)
Important Message  Patient Details  Name: Dennis [name]. MRN: [id number] Date of Birth: 04/25/1961   Medicare Important Message Given:  Yes     Arzu Mcgaughey 12/27/2019, 2:59 PM

## 2019-12-27 NOTE — Progress Notes (Signed)
Inpatient Diabetes Program Recommendations  AACE/ADA: New Consensus Statement on Inpatient Glycemic Control   Target Ranges:  Prepandial:   less than 140 mg/dL      Peak postprandial:   less than 180 mg/dL (1-2 hours)      Critically ill patients:  140 - 180 mg/dL   Results for Dennis [name], Dennis [name] (MRN [id number]) as of 12/27/2019 10:17  Ref. Range 12/26/2019 06:08 12/26/2019 08:48 12/26/2019 12:09 12/26/2019 16:09 12/26/2019 22:04 12/27/2019 06:08  Glucose-Capillary Latest Ref Range: 70 - 99 mg/dL 197 (H) 120 (H) 69 (L) 273 (H) 234 (H) 140 (H)   Review of Glycemic Control  Diabetes history: DM2 Outpatient Diabetes medications: Lantus 32 units QHS, Jardiance 10 mg QAM, Metformin 500 mg BID Current orders for Inpatient glycemic control: Lantus 25 units QHS, Novolog 0-9 units TID with meals, Novolog 0-5 units QHS, Invokana 100 mg QAM; Decadron 4 mg BID  Inpatient Diabetes Program Recommendations:   Insulin-Meal Coverage: If steroids are continued and if post prandial glucose is consistently greater than 180 mg/dl, please consider ordering Novolog 4 units TID with meals for meal coverage if patient eats at least 50% of meals.   Thanks, Barnie Alderman, RN, MSN, CDE Diabetes Coordinator Inpatient Diabetes Program [phone] (Team Pager from 8am to 5pm)

## 2019-12-28 ENCOUNTER — Encounter (HOSPITAL_COMMUNITY): Payer: Self-pay | Admitting: Family Medicine

## 2019-12-28 ENCOUNTER — Inpatient Hospital Stay (HOSPITAL_COMMUNITY): Payer: Medicare Other | Admitting: Certified Registered"

## 2019-12-28 ENCOUNTER — Other Ambulatory Visit: Payer: Self-pay | Admitting: Radiation Therapy

## 2019-12-28 ENCOUNTER — Telehealth: Payer: Self-pay | Admitting: Radiation Therapy

## 2019-12-28 ENCOUNTER — Encounter (HOSPITAL_COMMUNITY)
Admission: EM | Disposition: A | Discharge status: Home / Self Care | Payer: Self-pay | Source: Home / Self Care | Attending: Family Medicine

## 2019-12-28 DIAGNOSIS — R590 Localized enlarged lymph nodes: Secondary | ICD-10-CM

## 2019-12-28 DIAGNOSIS — C7931 Secondary malignant neoplasm of brain: Secondary | ICD-10-CM

## 2019-12-28 HISTORY — PX: BRONCHIAL NEEDLE ASPIRATION BIOPSY: SHX5106

## 2019-12-28 HISTORY — PX: VIDEO BRONCHOSCOPY WITH ENDOBRONCHIAL ULTRASOUND: SHX6177

## 2019-12-28 HISTORY — PX: BRONCHIAL BRUSHINGS: SHX5108

## 2019-12-28 HISTORY — PX: BRONCHIAL WASHINGS: SHX5105

## 2019-12-28 LAB — BASIC METABOLIC PANEL
Anion gap: 12 (ref 5–15)
Anion gap: 10 (ref 5–15)
BUN: 30 mg/dL — ABNORMAL HIGH (ref 6–20)
BUN: 25 mg/dL — ABNORMAL HIGH (ref 6–20)
CO2: 22 mmol/L (ref 22–32)
CO2: 23 mmol/L (ref 22–32)
Calcium: 9 mg/dL (ref 8.9–10.3)
Calcium: 9 mg/dL (ref 8.9–10.3)
Chloride: 102 mmol/L (ref 98–111)
Chloride: 106 mmol/L (ref 98–111)
Creatinine, Ser: 1.3 mg/dL — ABNORMAL HIGH (ref 0.61–1.24)
Creatinine, Ser: 1.27 mg/dL — ABNORMAL HIGH (ref 0.61–1.24)
GFR calc Af Amer: >60 mL/min (ref >60)
GFR calc Af Amer: >60 mL/min (ref >60)
GFR calc non Af Amer: >60 mL/min (ref >60)
GFR calc non Af Amer: >60 mL/min (ref >60)
Glucose, Bld: 285 mg/dL — ABNORMAL HIGH (ref 70–99)
Glucose, Bld: 113 mg/dL — ABNORMAL HIGH (ref 70–99)
Potassium: 4.4 mmol/L (ref 3.5–5.1)
Potassium: 4.6 mmol/L (ref 3.5–5.1)
Sodium: 136 mmol/L (ref 135–145)
Sodium: 139 mmol/L (ref 135–145)

## 2019-12-28 LAB — PROTIME-INR
INR: 1.2 (ref 0.8–1.2)
Prothrombin Time: 14.7 seconds (ref 11.4–15.2)

## 2019-12-28 LAB — GLUCOSE, CAPILLARY
Glucose-Capillary: 225 mg/dL — ABNORMAL HIGH (ref 70–99)
Glucose-Capillary: 128 mg/dL — ABNORMAL HIGH (ref 70–99)
Glucose-Capillary: 87 mg/dL (ref 70–99)

## 2019-12-28 LAB — CBC
HCT: 37.8 % — ABNORMAL LOW (ref 39.0–52.0)
Hemoglobin: 12 g/dL — ABNORMAL LOW (ref 13.0–17.0)
MCH: 30.2 pg (ref 26.0–34.0)
MCHC: 31.7 g/dL (ref 30.0–36.0)
MCV: 95 fL (ref 80.0–100.0)
Platelets: 293 10*3/uL (ref 150–400)
RBC: 3.98 MIL/uL — ABNORMAL LOW (ref 4.22–5.81)
RDW: 13.8 % (ref 11.5–15.5)
WBC: 14.7 10*3/uL — ABNORMAL HIGH (ref 4.0–10.5)
nRBC: 0 % (ref 0.0–0.2)

## 2019-12-28 SURGERY — BRONCHOSCOPY, WITH EBUS
Anesthesia: General

## 2019-12-28 MED ORDER — LACTATED RINGERS IV SOLN: INTRAVENOUS | Status: DC | PRN

## 2019-12-28 MED ORDER — DEXAMETHASONE SODIUM PHOSPHATE 10 MG/ML IJ SOLN
INTRAMUSCULAR | Status: DC | PRN
  Administered 2019-12-28: 5 mg via INTRAVENOUS

## 2019-12-28 MED ORDER — ONDANSETRON HCL 4 MG/2ML IJ SOLN
INTRAMUSCULAR | Status: DC | PRN
  Administered 2019-12-28: 4 mg via INTRAVENOUS

## 2019-12-28 MED ORDER — LIDOCAINE 2% (20 MG/ML) 5 ML SYRINGE
INTRAMUSCULAR | Status: DC | PRN
  Administered 2019-12-28: 100 mg via INTRAVENOUS

## 2019-12-28 MED ORDER — ETOMIDATE 2 MG/ML IV SOLN
INTRAVENOUS | Status: DC | PRN
  Administered 2019-12-28: 14 mg via INTRAVENOUS

## 2019-12-28 MED ORDER — SUGAMMADEX SODIUM 200 MG/2ML IV SOLN
INTRAVENOUS | Status: DC | PRN
  Administered 2019-12-28: 200 mg via INTRAVENOUS

## 2019-12-28 MED ORDER — FENTANYL CITRATE (PF) 250 MCG/5ML IJ SOLN
INTRAMUSCULAR | Status: DC | PRN
  Administered 2019-12-28 (×2): 50 ug via INTRAVENOUS

## 2019-12-28 MED ORDER — PROMETHAZINE HCL 25 MG/ML IJ SOLN
6.2500 mg | INTRAMUSCULAR | Status: DC | PRN
  Administered 2019-12-28: 6.25 mg via INTRAVENOUS

## 2019-12-28 MED ORDER — SUCCINYLCHOLINE CHLORIDE 20 MG/ML IJ SOLN
INTRAMUSCULAR | Status: DC | PRN
  Administered 2019-12-28: 120 mg via INTRAVENOUS

## 2019-12-28 MED ORDER — LACTATED RINGERS IV SOLN: INTRAVENOUS | Status: DC

## 2019-12-28 MED ORDER — PHENYLEPHRINE HCL-NACL 10-0.9 MG/250ML-% IV SOLN
INTRAVENOUS | Status: DC | PRN
  Administered 2019-12-28: 25 ug/min via INTRAVENOUS

## 2019-12-28 MED ORDER — FENTANYL CITRATE (PF) 100 MCG/2ML IJ SOLN: 25.0000 ug | INTRAMUSCULAR | Status: DC | PRN

## 2019-12-28 MED ORDER — DEXAMETHASONE 4 MG PO TABS: 4.0000 mg | ORAL_TABLET | Freq: Two times a day (BID) | ORAL | 0 refills | Status: DC

## 2019-12-28 MED ORDER — ROCURONIUM BROMIDE 10 MG/ML (PF) SYRINGE
PREFILLED_SYRINGE | INTRAVENOUS | Status: DC | PRN
  Administered 2019-12-28: 60 mg via INTRAVENOUS

## 2019-12-28 MED ORDER — SACUBITRIL-VALSARTAN 49-51 MG PO TABS: 1.0000 | ORAL_TABLET | Freq: Two times a day (BID) | ORAL | 0 refills | Status: DC

## 2019-12-28 MED ORDER — PROMETHAZINE HCL 25 MG/ML IJ SOLN
INTRAMUSCULAR | Status: AC
  Filled 2019-12-28: qty 1

## 2019-12-28 MED FILL — DEXAMETHASONE 4 MG TABLET: 4 | 30 days supply | Qty: 60 | Fill #0

## 2019-12-28 MED FILL — ENTRESTO 49 MG-51 MG TABLET: 49-51 | 30 days supply | Qty: 60 | Fill #0

## 2019-12-28 SURGICAL SUPPLY — 53 items
ADAPTER BRONCH F/PENTAX (ADAPTER) ×3 IMPLANT
ADAPTER VALVE BIOPSY EBUS (MISCELLANEOUS) IMPLANT
ADPR BSCP EDG PNTX (ADAPTER) ×2
ADPTR VALVE BIOPSY EBUS (MISCELLANEOUS)
BRUSH CYTOL CELLEBRITY 1.5X140 (MISCELLANEOUS) ×3 IMPLANT
BRUSH SUPERTRAX BIOPSY (INSTRUMENTS) IMPLANT
BRUSH SUPERTRAX NDL-TIP CYTO (INSTRUMENTS) ×3 IMPLANT
CANISTER SUCT 3000ML PPV (MISCELLANEOUS) ×3 IMPLANT
CHANNEL WORK EXTEND EDGE 180 (KITS) IMPLANT
CHANNEL WORK EXTEND EDGE 45 (KITS) IMPLANT
CHANNEL WORK EXTEND EDGE 90 (KITS) IMPLANT
CONT SPEC 4OZ CLIKSEAL STRL BL (MISCELLANEOUS) ×3 IMPLANT
COVER BACK TABLE 60X90IN (DRAPES) ×3 IMPLANT
COVER DOME SNAP 22 D (MISCELLANEOUS) ×3 IMPLANT
FILTER STRAW FLUID ASPIR (MISCELLANEOUS) IMPLANT
FORCEPS BIOP RJ4 1.8 (CUTTING FORCEPS) IMPLANT
FORCEPS BIOP SUPERTRX PREMAR (INSTRUMENTS) ×3 IMPLANT
GAUZE SPONGE 4X4 12PLY STRL (GAUZE/BANDAGES/DRESSINGS) ×3 IMPLANT
GLOVE BIO SURGEON STRL SZ7.5 (GLOVE) ×3 IMPLANT
GLOVE SURG SS PI 7.5 STRL IVOR (GLOVE) ×6 IMPLANT
GOWN STRL REUS W/ TWL LRG LVL3 (GOWN DISPOSABLE) ×4 IMPLANT
GOWN STRL REUS W/TWL LRG LVL3 (GOWN DISPOSABLE) ×6
KIT CLEAN ENDO COMPLIANCE (KITS) ×6 IMPLANT
KIT LOCATABLE GUIDE (CANNULA) IMPLANT
KIT MARKER FIDUCIAL DELIVERY (KITS) IMPLANT
KIT PROCEDURE EDGE 180 (KITS) IMPLANT
KIT PROCEDURE EDGE 45 (KITS) IMPLANT
KIT PROCEDURE EDGE 90 (KITS) IMPLANT
KIT TURNOVER KIT B (KITS) ×3 IMPLANT
MARKER SKIN DUAL TIP RULER LAB (MISCELLANEOUS) ×3 IMPLANT
NDL EBUS SONO TIP PENTAX (NEEDLE) ×2 IMPLANT
NDL SUPERTRX PREMARK BIOPSY (NEEDLE) ×2 IMPLANT
NEEDLE EBUS SONO TIP PENTAX (NEEDLE) ×3 IMPLANT
NEEDLE SUPERTRX PREMARK BIOPSY (NEEDLE) ×3 IMPLANT
NS IRRIG 1000ML POUR BTL (IV SOLUTION) ×3 IMPLANT
OIL SILICONE PENTAX (PARTS (SERVICE/REPAIRS)) ×3 IMPLANT
PAD ARMBOARD 7.5X6 YLW CONV (MISCELLANEOUS) ×6 IMPLANT
PATCHES PATIENT (LABEL) ×9 IMPLANT
SOL ANTI FOG 6CC (MISCELLANEOUS) ×2 IMPLANT
SOLUTION ANTI FOG 6CC (MISCELLANEOUS) ×1
SYR 20CC LL (SYRINGE) ×6 IMPLANT
SYR 20ML ECCENTRIC (SYRINGE) ×6 IMPLANT
SYR 50ML SLIP (SYRINGE) ×3 IMPLANT
SYR 5ML LUER SLIP (SYRINGE) ×3 IMPLANT
TOWEL OR 17X24 6PK STRL BLUE (TOWEL DISPOSABLE) ×3 IMPLANT
TRAP SPECIMEN MUCOUS 40CC (MISCELLANEOUS) IMPLANT
TUBE CONNECTING 20X1/4 (TUBING) ×6 IMPLANT
UNDERPAD 30X30 (UNDERPADS AND DIAPERS) ×3 IMPLANT
VALVE BIOPSY  SINGLE USE (MISCELLANEOUS) ×3
VALVE BIOPSY SINGLE USE (MISCELLANEOUS) ×2 IMPLANT
VALVE DISPOSABLE (MISCELLANEOUS) ×3 IMPLANT
VALVE SUCTION BRONCHIO DISP (MISCELLANEOUS) ×3 IMPLANT
WATER STERILE IRR 1000ML POUR (IV SOLUTION) ×3 IMPLANT

## 2019-12-28 NOTE — Transfer of Care (Signed)
Immediate Anesthesia Transfer of Care Note  Patient: Garik Diamant.  Procedure(s) Performed: VIDEO BRONCHOSCOPY WITH ENDOBRONCHIAL ULTRASOUND (N/A ) BRONCHIAL NEEDLE ASPIRATION BIOPSIES BRONCHIAL BRUSHINGS BRONCHIAL WASHINGS  Patient Location: PACU  Anesthesia Type:General  Level of Consciousness: awake, alert  and oriented  Airway & Oxygen Therapy: Patient Spontanous Breathing and Patient connected to face mask oxygen  Post-op Assessment: Report given to RN, Post -op Vital signs reviewed and stable and Patient moving all extremities X 4  Post vital signs: Reviewed and stable  Last Vitals:  Vitals Value Taken Time  BP 128/93 12/28/19 1426  Temp    Pulse 77 12/28/19 1427  Resp 17 12/28/19 1427  SpO2 100 % 12/28/19 1427  Vitals shown include unvalidated device data.  Last Pain:  Vitals:   12/28/19 1140  TempSrc: Oral  PainSc:          Complications: No apparent anesthesia complications

## 2019-12-28 NOTE — Anesthesia Procedure Notes (Signed)
Procedure Name: Intubation Date/Time: 12/28/2019 1:09 PM Performed by: Gaylene Brooks, CRNA Pre-anesthesia Checklist: Patient identified, Emergency Drugs available, Suction available and Patient being monitored Patient Re-evaluated:Patient Re-evaluated prior to induction Oxygen Delivery Method: Circle System Utilized Preoxygenation: Pre-oxygenation with 100% oxygen Induction Type: IV induction Laryngoscope Size: Miller and 2 Grade View: Grade I Tube type: Oral Tube size: 8.5 mm Number of attempts: 1 Airway Equipment and Method: Stylet and Oral airway Placement Confirmation: ETT inserted through vocal cords under direct vision,  positive ETCO2 and breath sounds checked- equal and bilateral Secured at: 22 cm Tube secured with: Tape Dental Injury: Teeth and Oropharynx as per pre-operative assessment

## 2019-12-28 NOTE — Telephone Encounter (Signed)
Spoke with [name] about his upcoming [place] treatment planning appointments. He shared that the biopsy is planned for this afternoon and was very happy to know we are moving forward with his treatment planning. He does have a relative that he wants to also be added to his list of people we can share information with, so he can review and weigh in on his decisions.   In addition to sharing the upcoming appointments over the phone, I also sent an e-mail to Mr. Bellina including the information as well. He was again, thankful and has my contact  Information if he has any questions.   Mont Dutton R.T.(R)(T) Radiation Special Procedures Navigator

## 2019-12-28 NOTE — Discharge Instructions (Signed)
Thank you for allowing [place] to participate in your care!    You were admitted for weakness and slurred speech.  You were found to have a brain mass that was suspected to be a malignancy from your lung.  Your warfarin was held until your INR came to a level that would be acceptable for you to undergo a lung biopsy with.  Lung biopsy was completed and you were ready for discharge.  You will be contacted by the oncologist regarding the results of your biopsy.  A few medication changes that were made include a decrease in your Entresto until you are seen by your cardiologist in 2 weeks.  They may increase the dose back to your regular amount at that time.  We are also going to continue on the dexamethasone until you are told to stop it by your oncologist.  You can continue all of your other home medications as prescribed.  If you experience worsening of your admission symptoms, develop shortness of breath, life threatening emergency, suicidal or homicidal thoughts you must seek medical attention immediately by calling 911 or calling your MD immediately  if symptoms less severe.   Flexible Bronchoscopy, Care After This sheet gives you information about how to care for yourself after your test. Your doctor may also give you more specific instructions. If you have problems or questions, contact your doctor. Follow these instructions at home: Eating and drinking  The day after the test, go back to your normal diet. Driving  Do not drive for 24 hours if you were given a medicine to help you relax (sedative).  Do not drive or use heavy machinery while taking prescription pain medicine. General instructions   Take over-the-counter and prescription medicines only as told by your doctor.  Return to your normal activities as told. Ask what activities are safe for you.  Do not use any products that have nicotine or tobacco in them. This includes cigarettes and e-cigarettes. If you need help quitting, ask  your doctor.  Keep all follow-up visits as told by your doctor. This is important. It is very important if you had a tissue sample (biopsy) taken. Get help right away if:  You have shortness of breath that gets worse.  You get light-headed.  You feel like you are going to pass out (faint).  You have chest pain.  You cough up: ? More than a little blood. ? More blood than before. Summary  Do not eat or drink anything (not even water) for 2 hours after your test, or until your numbing medicine wears off.  Do not use cigarettes. Do not use e-cigarettes.  Get help right away if you have chest pain. This information is not intended to replace advice given to you by your health care provider. Make sure you discuss any questions you have with your health care provider. Document Revised: 09/11/2017 Document Reviewed: 10/17/2016 Elsevier Patient Education  2020 [employer].

## 2019-12-28 NOTE — Interval H&P Note (Signed)
History and Physical Interval Note:  12/28/2019 12:57 PM  Dennis [name].  has presented today for surgery, with the diagnosis of LUNG MASS.  The various methods of treatment have been discussed with the patient and family. After consideration of risks, benefits and other options for treatment, the patient has consented to  Procedure(s): VIDEO BRONCHOSCOPY WITH ENDOBRONCHIAL NAVIGATION (N/A) VIDEO BRONCHOSCOPY WITH ENDOBRONCHIAL ULTRASOUND (N/A) as a surgical intervention.  The patient's history has been reviewed, patient examined, no change in status, stable for surgery.  I have reviewed the patient's chart and labs.  Questions were answered to the patient's satisfaction.    Discussed risks and benefits of proceeding with bronchoscopy. Patient is agreeable. No barriers to proceed at this time.   [place]

## 2019-12-28 NOTE — Op Note (Signed)
Video Bronchoscopy with Endobronchial Ultrasound Procedure Note  Date of Operation: 12/28/2019  Pre-op Diagnosis: Mediastinal adenopathy, cerebral brain met  Post-op Diagnosis: Mediastinal adenopathy, cerebral brain met  Surgeon: Garner Nash, DO   Assistants: None   Anesthesia: General endotracheal anesthesia  Operation: Flexible video fiberoptic bronchoscopy with endobronchial ultrasound and biopsies.  Estimated Blood Loss: Minimal, <0HO   Complications: None   Indications and History: Dennis [name]. is a 59 y.o. male with cerebral met and mediastinal adenopathy.  The risks, benefits, complications, treatment options and expected outcomes were discussed with the patient.  The possibilities of pneumothorax, pneumonia, reaction to medication, pulmonary aspiration, perforation of a viscus, bleeding, failure to diagnose a condition and creating a complication requiring transfusion or operation were discussed with the patient who freely signed the consent.    Description of Procedure: The patient was examined in the preoperative area and history and data from the preprocedure consultation were reviewed. It was deemed appropriate to proceed.  The patient was taken to endoscopy room 2, identified as Dennis [name]. and the procedure verified as Flexible Video Fiberoptic Bronchoscopy.  A Time Out was held and the above information confirmed. After being taken to the operating room general anesthesia was initiated and the patient  was orally intubated. 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  There is few scattered bronchial pitting in the distal airways.  The right mainstem was clear however the left mainstem has evidence of tumor erosion along the medial wall with friable ulcerations along the medial aspect  just past the main bifurcation of the carina.. The standard scope was then withdrawn and the endobronchial ultrasound was used to identify and characterize the peritracheal, hilar and bronchial lymph nodes. Inspection showed enlarged 7 lymph node, the aortopulmonary nodes were very difficult to visualize.  We were able to get a small glimpse of them in the AP window but no target for needle passing. Using real-time ultrasound guidance Wang needle biopsies were take from Station 7 nodes and were sent for cytology.  Following endobronchial ultrasound transbronchial needle aspirations of station 7 we switched to the standard bronchoscope.  A cytology brush was used for brushings of the left mainstem.  We also completed lavage of the left mainstem to be collected for cytology. The patient tolerated the procedure well without apparent complications. There was no significant blood loss. The bronchoscope was withdrawn. Anesthesia was reversed and the patient was taken to the PACU for recovery.   Samples: 1. Wang needle biopsies from station 7 node 2.  Endobronchial cytology brushings of left mainstem 3.  Left mainstem bronchial washings for cytology  Plans:  The patient will be discharged from the PACU to home when recovered from anesthesia. We will review the cytology, pathology and microbiology results with the patient when they become available. Outpatient followup will be with Garner Nash, DO    Garner Nash, DO [facility] 12/28/2019 2:31 PM

## 2019-12-28 NOTE — TOC Transition Note (Signed)
Transition of Care [facility]) - CM/SW Discharge Note   Patient Details  Name: Dennis [name]. MRN: [id number] Date of Birth: [birth year]  Transition of Care [facility]) CM/SW Contact:  Pollie Friar, RN Phone Number: 12/28/2019, 4:11 PM   Clinical Narrative:    Pt discharging home with outpatient therapy. Information on his AVS.  Pt has transportation home.    Final next level of care: OP Rehab Barriers to Discharge: No Barriers Identified   Patient Goals and CMS Choice     Choice offered to / list presented to : Patient  Discharge Placement                       Discharge Plan and Services   Discharge Planning Services: CM Consult                                 Social Determinants of Health (SDOH) Interventions     Readmission Risk Interventions No flowsheet data found.

## 2019-12-28 NOTE — Progress Notes (Signed)
OT Cancellation    12/28/19 1400  OT Visit Information  Last OT Received On 12/28/19  Reason Eval/Treat Not Completed Patient at procedure or test/ unavailable  Maurie Boettcher, OT/L   Acute OT Clinical Specialist [place] Pager [phone] Office [phone]

## 2019-12-28 NOTE — Progress Notes (Signed)
Inpatient Diabetes Program Recommendations  AACE/ADA: New Consensus Statement on Inpatient Glycemic Control   Target Ranges:  Prepandial:   less than 140 mg/dL      Peak postprandial:   less than 180 mg/dL (1-2 hours)      Critically ill patients:  140 - 180 mg/dL   Results for KIRKLIN, MCDUFFEE (MRN [id number]) as of 12/28/2019 10:01  Ref. Range 12/27/2019 06:08 12/27/2019 12:27 12/27/2019 15:59 12/27/2019 21:45 12/28/2019 05:54  Glucose-Capillary Latest Ref Range: 70 - 99 mg/dL 140 (H) 164 (H) 275 (H) 346 (H) 225 (H)   Review of Glycemic Control  Diabetes history:DM2 Outpatient Diabetes medications:Lantus 32 units QHS, Jardiance 10 mg QAM, Metformin 500 mg BID Current orders for Inpatient glycemic control:Lantus 25 units QHS, Novolog 0-9 units TID with meals, Novolog 0-5 units QHS, Invokana 100 mg QAM; Decadron 4 mg BID  Inpatient Diabetes Program Recommendations:  Insulin-basal: If steroids are continued, please consider increasing Lantus to 28 units QHS.  Insulin-Meal Coverage:If steroids are continued and if post prandial glucose is consistently greater than 180 mg/dl, please consider ordering Novolog4units TID with meals for meal coverage if patient eats at least 50% of meals.  Thanks, Barnie Alderman, RN, MSN, CDE Diabetes Coordinator Inpatient Diabetes Program [phone] (Team Pager from 8am to 5pm)

## 2019-12-28 NOTE — Anesthesia Preprocedure Evaluation (Signed)
Anesthesia Evaluation  Patient identified by MRN, date of birth, ID band  Reviewed: Allergy & Precautions, NPO status , Patient's Chart, lab work & pertinent test results  Airway Mallampati: II       Dental   Pulmonary sleep apnea , pneumonia, Current Smoker and Patient abstained from smoking.,    breath sounds clear to auscultation       Cardiovascular hypertension, + CAD and +CHF  + Cardiac Defibrillator + Valvular Problems/Murmurs  Rhythm:Regular Rate:Normal     Neuro/Psych PSYCHIATRIC DISORDERS Depression CVA    GI/Hepatic negative GI ROS, Neg liver ROS,   Endo/Other  diabetes  Renal/GU negative Renal ROS     Musculoskeletal   Abdominal   Peds  Hematology   Anesthesia Other Findings   Reproductive/Obstetrics                             Anesthesia Physical Anesthesia Plan  ASA: IV  Anesthesia Plan: General   Post-op Pain Management:    Induction: Intravenous  PONV Risk Score and Plan: 2 and Ondansetron and Midazolam  Airway Management Planned: Oral ETT  Additional Equipment:   Intra-op Plan:   Post-operative Plan: Possible Post-op intubation/ventilation  Informed Consent: I have reviewed the patients History and Physical, chart, labs and discussed the procedure including the risks, benefits and alternatives for the proposed anesthesia with the patient or authorized representative who has indicated his/her understanding and acceptance.     Dental advisory given  Plan Discussed with: CRNA, Anesthesiologist and Surgeon  Anesthesia Plan Comments:         Anesthesia Quick Evaluation

## 2019-12-28 NOTE — Anesthesia Postprocedure Evaluation (Signed)
Anesthesia Post Note  Patient: Dennis [name].  Procedure(s) Performed: VIDEO BRONCHOSCOPY WITH ENDOBRONCHIAL ULTRASOUND (N/A ) BRONCHIAL NEEDLE ASPIRATION BIOPSIES BRONCHIAL BRUSHINGS BRONCHIAL WASHINGS     Patient location during evaluation: PACU Anesthesia Type: General Level of consciousness: awake Pain management: pain level controlled Vital Signs Assessment: post-procedure vital signs reviewed and stable Respiratory status: spontaneous breathing Cardiovascular status: stable Postop Assessment: no apparent nausea or vomiting Anesthetic complications: no    Last Vitals:  Vitals:   12/28/19 1440 12/28/19 1455  BP: 118/86 113/85  Pulse: 77 76  Resp: 13 15  Temp:    SpO2: 92% 92%    Last Pain:  Vitals:   12/28/19 1140  TempSrc: Oral  PainSc:                  Burl Tauzin

## 2019-12-28 NOTE — Anesthesia Procedure Notes (Signed)
Arterial Line Insertion Start/End3/17/2021 12:35 PM, 12/28/2019 12:50 PM Performed by: Josephine Igo, CRNA, CRNA  Patient location: Pre-op. Preanesthetic checklist: patient identified, IV checked, surgical consent and monitors and equipment checked Lidocaine 1% used for infiltration Right, radial was placed Catheter size: 20 G Hand hygiene performed , maximum sterile barriers used  and Seldinger technique used  Attempts: 1 Procedure performed without using ultrasound guided technique. Following insertion, dressing applied and Biopatch. Post procedure assessment: normal  Patient tolerated the procedure well with no immediate complications.

## 2019-12-28 NOTE — Progress Notes (Addendum)
Progress Note  Patient Name: Dennis [name]. Date of Encounter: 12/28/2019  Primary Cardiologist: Glori Bickers, MD   Subjective   Patient feels well this morning, but anxious about his procedure this afternoon.  Inpatient Medications    Scheduled Meds:  atorvastatin  20 mg Oral q1800   canagliflozin  100 mg Oral QAC breakfast   carvedilol  9.375 mg Oral BID WC   dexamethasone  4 mg Oral BID PC   insulin aspart  0-5 Units Subcutaneous QHS   insulin aspart  0-9 Units Subcutaneous TID WC   insulin glargine  25 Units Subcutaneous Q2200   pantoprazole  40 mg Oral Q0600   sacubitril-valsartan  1 tablet Oral BID   Continuous Infusions:   PRN Meds:    Vital Signs    Vitals:   12/28/19 0007 12/28/19 0341 12/28/19 0837 12/28/19 1140  BP: 106/78 (!) 125/93 98/66 111/78  Pulse: 81 78 81 79  Resp: 16 16 16 16   Temp: 97.7 F (36.5 C) 97.7 F (36.5 C) 98 F (36.7 C) 98 F (36.7 C)  TempSrc: Oral Oral Oral Oral  SpO2: 100% 99% 100% 100%  Weight:      Height:        Intake/Output Summary (Last 24 hours) at 12/28/2019 1148 Last data filed at 12/28/2019 0100 Gross per 24 hour  Intake 390 ml  Output 850 ml  Net -460 ml   Last 3 Weights 12/23/2019 10/07/2019 08/05/2019  Weight (lbs) 170 lb 170 lb 163 lb 6.4 oz  Weight (kg) 77.111 kg 77.111 kg 74.118 kg  Some encounter information is confidential and restricted. Go to Review Flowsheets activity to see all data.      Telemetry    Sinus rhythm in the 70-80s - Personally Reviewed  ECG    No new tracings - Personally Reviewed  Physical Exam   GEN: No acute distress.   Neck: No JVD Cardiac: RRR, no murmurs, rubs, or gallops.  Respiratory: Clear to auscultation bilaterally. GI: Soft, nontender, non-distended  MS: No edema; No deformity. Neuro:  Nonfocal  Psych: Normal affect   Labs    High Sensitivity Troponin:  No results for input(s): TROPONINIHS in the last 720 hours.    Chemistry Recent Labs  Lab  12/23/19 1056 12/23/19 1132 12/24/19 0411 12/25/19 0421 12/28/19 0333  NA 139   < > 139 138 136  K 4.6   < > 4.0 4.2 4.4  CL 107   < > 103 107 102  CO2 21*   < > 23 24 22   GLUCOSE 135*   < > 149* 169* 285*  BUN 25*   < > 28* 34* 30*  CREATININE 1.64*   < > 1.60* 1.39* 1.30*  CALCIUM 9.1   < > 8.8* 8.8* 9.0  PROT 6.7  --   --   --   --   ALBUMIN 3.3*  --   --   --   --   AST 18  --   --   --   --   ALT 13  --   --   --   --   ALKPHOS 96  --   --   --   --   BILITOT 1.0  --   --   --   --   GFRNONAA 46*   < > 47* 56* >60  GFRAA 53*   < > 55* >60 >60  ANIONGAP 11   < > 13 7 12    < > =  values in this interval not displayed.     Hematology Recent Labs  Lab 12/24/19 0411 12/25/19 0421 12/28/19 0333  WBC 10.9* 14.3* 14.7*  RBC 4.28 3.86* 3.98*  HGB 12.7* 11.5* 12.0*  HCT 40.5 36.0* 37.8*  MCV 94.6 93.3 95.0  MCH 29.7 29.8 30.2  MCHC 31.4 31.9 31.7  RDW 13.4 13.6 13.8  PLT 288 277 293    BNPNo results for input(s): BNP, PROBNP in the last 168 hours.   DDimer No results for input(s): DDIMER in the last 168 hours.   Radiology    No results found.  Cardiac Studies   Echo 12/25/19:  1. Left ventricular ejection fraction, by estimation, is 20 to 25%. The  left ventricle has severely decreased function. The left ventricle  demonstrates global hypokinesis. There is moderate asymmetric left  ventricular hypertrophy of the inferolateral  segment. Left ventricular diastolic parameters are indeterminate.   2. Right ventricular systolic function is normal. The right ventricular  size is normal. Tricuspid regurgitation signal is inadequate for assessing  PA pressure.   3. Left atrial size was moderately dilated.   4. The mitral valve is normal in structure. Mild mitral valve  regurgitation. No evidence of mitral stenosis.   5. The aortic valve is tricuspid. Aortic valve regurgitation is trivial.  No aortic stenosis is present.   6. The inferior vena cava is dilated in  size with <50% respiratory  variability, suggesting right atrial pressure of 15 mmHg.   Patient Profile     59 y.o. 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 Wt 74 kg at last CHF visit, on Coreg, Entresto, spiro.  Admitted 03/12 w/ ?CVA, but had 1.5 cm R cerebral mass, 1.5 cm spiculated RML lung lesion ?[place] lung CA.  Assessment & Plan    1. 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 Mixed ICM/NICM 3. S/P [employer] dual chamber ICD - spironolactone and entresto D/C'ed for rising creatinine - renal function is back to baseline - plan to increase entresto to 49-51 mg BID - he is NPO for the procedure which complicates gauging his BP and he did not have morning medications - he does not appear volume overloaded and is breathing at baseline  - recommending giving current dose of entresto when he returns from procedure with plans to increase his evening dose of entresto as above - daily weights have not been collected   4. AoCKD - renal function appears trending closer to baseline - continue to monitor   5. Hypertension - medications as above - would be best to resume his home CHF medications entresto and spironolactone   We will arrange follow up with Dr. Haroldine Laws after discharge.       For questions or updates, please contact [place] Please consult [email or web address] for contact info under        Signed, Ledora Bottcher, PA  12/28/2019, 11:48 AM    I have seen and examined the patient along with A. Duke, PA.  I have reviewed the chart, notes and new data.  I agree with PA/NP's note.  Key new complaints: no CV complaints Key examination changes: appears euvolemic clinically Key new findings / data: for EBUS today  PLAN: Will follow up after the procedure. Increase Entresto today.  Sanda Klein, MD,  [place] [phone] 12/28/2019, 1:16 PM

## 2019-12-29 ENCOUNTER — Telehealth: Payer: Self-pay | Admitting: Radiation Oncology

## 2019-12-29 ENCOUNTER — Telehealth: Payer: Self-pay | Admitting: Hematology

## 2019-12-29 LAB — CYTOLOGY - NON PAP

## 2019-12-29 NOTE — Telephone Encounter (Addendum)
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 I called pt and left him a message for him to call me back to discuss biopsy result. I have informed rad/onc APP Bryson Ha to see if they can start radiation sooner. He is currently scheduled simulation next Tuesday. I also referred pt to my partner Dr. Julien Nordmann.   Dennis [name]  12/29/2019  1:10 PM   Pt called back and I discussed the biopsy results with him, questions were answered. He plan to come in on Monday 3/22 for radiation simulation.   Dennis [name]  12/29/2019 1:47 PM

## 2019-12-29 NOTE — Telephone Encounter (Signed)
I called and LM for the patient to let him know we would be changing some of his appointments based on his biopsy results. I will try to call him back tomorrow to clarify.

## 2020-01-02 ENCOUNTER — Telehealth: Payer: Self-pay | Admitting: Radiation Oncology

## 2020-01-02 ENCOUNTER — Inpatient Hospital Stay: Payer: Medicare Other | Attending: Radiation Oncology

## 2020-01-02 ENCOUNTER — Other Ambulatory Visit (HOSPITAL_COMMUNITY): Payer: Self-pay

## 2020-01-02 DIAGNOSIS — C342 Malignant neoplasm of middle lobe, bronchus or lung: Secondary | ICD-10-CM | POA: Insufficient documentation

## 2020-01-02 DIAGNOSIS — C7931 Secondary malignant neoplasm of brain: Secondary | ICD-10-CM | POA: Insufficient documentation

## 2020-01-02 DIAGNOSIS — E119 Type 2 diabetes mellitus without complications: Secondary | ICD-10-CM | POA: Insufficient documentation

## 2020-01-02 MED ORDER — SPIRONOLACTONE 25 MG PO TABS: 25.0000 mg | ORAL_TABLET | Freq: Every day | ORAL | 3 refills | Status: DC

## 2020-01-02 MED ORDER — MEMANTINE HCL 5 MG PO TABS: ORAL_TABLET | ORAL | 0 refills | Status: DC

## 2020-01-02 NOTE — Telephone Encounter (Signed)
I spoke with Dennis [name] today to review his pathology results of small cell cancer. This changes the original plan which was for [facility]. Rather, Dr. Lisbeth Renshaw recommends whole brain radiotherapy and we discussed the risks, benefits, short, and long term effects of therapy and the patient is interested in proceeding. He is also counseled on the rationale to consider namenda to try to reduce risks of cognitive change. A new rx is sent into his pharmacy and he knows to begin this on the first day of radiotherapy and that it's for a course of about 6 months. We will also coordinate evaluation with Dr. Mickeal Skinner too for long term follow up given his focal presentation. He reports though he is doing quite well since hospital discharge and is out already this morning doing errands.

## 2020-01-03 ENCOUNTER — Ambulatory Visit
Admission: RE | Admit: 2020-01-03 | Discharge: 2020-01-03 | Disposition: A | Discharge status: Home / Self Care | Payer: Medicare Other | Source: Ambulatory Visit | Attending: Radiation Oncology | Admitting: Radiation Oncology

## 2020-01-03 ENCOUNTER — Other Ambulatory Visit: Payer: Self-pay | Admitting: Medical Oncology

## 2020-01-03 ENCOUNTER — Other Ambulatory Visit: Payer: Self-pay

## 2020-01-03 ENCOUNTER — Ambulatory Visit: Payer: Medicare Other | Admitting: Radiation Oncology

## 2020-01-03 ENCOUNTER — Ambulatory Visit: Payer: Medicare Other

## 2020-01-03 DIAGNOSIS — Z510 Encounter for antineoplastic radiation therapy: Secondary | ICD-10-CM | POA: Diagnosis not present

## 2020-01-03 DIAGNOSIS — R918 Other nonspecific abnormal finding of lung field: Secondary | ICD-10-CM

## 2020-01-03 DIAGNOSIS — C7931 Secondary malignant neoplasm of brain: Secondary | ICD-10-CM | POA: Diagnosis present

## 2020-01-04 ENCOUNTER — Encounter: Payer: Self-pay | Admitting: Physician Assistant

## 2020-01-04 ENCOUNTER — Ambulatory Visit: Payer: Medicare Other

## 2020-01-04 ENCOUNTER — Other Ambulatory Visit: Payer: Self-pay

## 2020-01-04 ENCOUNTER — Inpatient Hospital Stay (HOSPITAL_BASED_OUTPATIENT_CLINIC_OR_DEPARTMENT_OTHER): Payer: Medicare Other | Admitting: Physician Assistant

## 2020-01-04 ENCOUNTER — Inpatient Hospital Stay: Payer: Medicare Other

## 2020-01-04 ENCOUNTER — Ambulatory Visit: Payer: Medicare Other | Admitting: Radiation Oncology

## 2020-01-04 ENCOUNTER — Ambulatory Visit
Admission: RE | Admit: 2020-01-04 | Discharge: 2020-01-04 | Disposition: A | Discharge status: Home / Self Care | Payer: Medicare Other | Source: Ambulatory Visit | Attending: Radiation Oncology | Admitting: Radiation Oncology

## 2020-01-04 VITALS — BP 100/59 | HR 85 | Temp 98.2°F | Resp 18 | Ht <= 58 in | Wt 160.4 lb

## 2020-01-04 DIAGNOSIS — E119 Type 2 diabetes mellitus without complications: Secondary | ICD-10-CM

## 2020-01-04 DIAGNOSIS — Z8546 Personal history of malignant neoplasm of prostate: Secondary | ICD-10-CM

## 2020-01-04 DIAGNOSIS — C779 Secondary and unspecified malignant neoplasm of lymph node, unspecified: Secondary | ICD-10-CM | POA: Diagnosis not present

## 2020-01-04 DIAGNOSIS — E11319 Type 2 diabetes mellitus with unspecified diabetic retinopathy without macular edema: Secondary | ICD-10-CM

## 2020-01-04 DIAGNOSIS — C3490 Malignant neoplasm of unspecified part of unspecified bronchus or lung: Secondary | ICD-10-CM

## 2020-01-04 DIAGNOSIS — Z510 Encounter for antineoplastic radiation therapy: Secondary | ICD-10-CM | POA: Diagnosis not present

## 2020-01-04 DIAGNOSIS — Z7189 Other specified counseling: Secondary | ICD-10-CM

## 2020-01-04 DIAGNOSIS — C7931 Secondary malignant neoplasm of brain: Secondary | ICD-10-CM

## 2020-01-04 DIAGNOSIS — R918 Other nonspecific abnormal finding of lung field: Secondary | ICD-10-CM

## 2020-01-04 DIAGNOSIS — Z794 Long term (current) use of insulin: Secondary | ICD-10-CM

## 2020-01-04 DIAGNOSIS — E279 Disorder of adrenal gland, unspecified: Secondary | ICD-10-CM

## 2020-01-04 DIAGNOSIS — Z8673 Personal history of transient ischemic attack (TIA), and cerebral infarction without residual deficits: Secondary | ICD-10-CM

## 2020-01-04 DIAGNOSIS — C342 Malignant neoplasm of middle lobe, bronchus or lung: Secondary | ICD-10-CM | POA: Diagnosis not present

## 2020-01-04 DIAGNOSIS — Z87891 Personal history of nicotine dependence: Secondary | ICD-10-CM

## 2020-01-04 DIAGNOSIS — Z950 Presence of cardiac pacemaker: Secondary | ICD-10-CM

## 2020-01-04 DIAGNOSIS — I509 Heart failure, unspecified: Secondary | ICD-10-CM

## 2020-01-04 DIAGNOSIS — Z809 Family history of malignant neoplasm, unspecified: Secondary | ICD-10-CM

## 2020-01-04 HISTORY — DX: Malignant neoplasm of unspecified part of unspecified bronchus or lung: C34.90

## 2020-01-04 LAB — CMP (CANCER CENTER ONLY)
ALT: 17 U/L (ref 0–44)
AST: 10 U/L — ABNORMAL LOW (ref 15–41)
Albumin: 3.2 g/dL — ABNORMAL LOW (ref 3.5–5.0)
Alkaline Phosphatase: 82 U/L (ref 38–126)
Anion gap: 8 (ref 5–15)
BUN: 44 mg/dL — ABNORMAL HIGH (ref 6–20)
CO2: 21 mmol/L — ABNORMAL LOW (ref 22–32)
Calcium: 9.1 mg/dL (ref 8.9–10.3)
Chloride: 102 mmol/L (ref 98–111)
Creatinine: 1.64 mg/dL — ABNORMAL HIGH (ref 0.61–1.24)
GFR, Est AFR Am: 53 mL/min — ABNORMAL LOW (ref >60)
GFR, Estimated: 46 mL/min — ABNORMAL LOW (ref >60)
Glucose, Bld: 534 mg/dL — ABNORMAL HIGH (ref 70–99)
Potassium: 4.6 mmol/L (ref 3.5–5.1)
Sodium: 131 mmol/L — ABNORMAL LOW (ref 135–145)
Total Bilirubin: 0.6 mg/dL (ref 0.3–1.2)
Total Protein: 6.2 g/dL — ABNORMAL LOW (ref 6.5–8.1)

## 2020-01-04 LAB — CBC WITH DIFFERENTIAL (CANCER CENTER ONLY)
Abs Immature Granulocytes: 0.06 10*3/uL (ref 0.00–0.07)
Basophils Absolute: 0 10*3/uL (ref 0.0–0.1)
Basophils Relative: 0 %
Eosinophils Absolute: 0 10*3/uL (ref 0.0–0.5)
Eosinophils Relative: 0 %
HCT: 38.4 % — ABNORMAL LOW (ref 39.0–52.0)
Hemoglobin: 12.4 g/dL — ABNORMAL LOW (ref 13.0–17.0)
Immature Granulocytes: 0 %
Lymphocytes Relative: 10 %
Lymphs Abs: 1.5 10*3/uL (ref 0.7–4.0)
MCH: 30.5 pg (ref 26.0–34.0)
MCHC: 32.3 g/dL (ref 30.0–36.0)
MCV: 94.3 fL (ref 80.0–100.0)
Monocytes Absolute: 0.9 10*3/uL (ref 0.1–1.0)
Monocytes Relative: 6 %
Neutro Abs: 12.5 10*3/uL — ABNORMAL HIGH (ref 1.7–7.7)
Neutrophils Relative %: 84 %
Platelet Count: 268 10*3/uL (ref 150–400)
RBC: 4.07 MIL/uL — ABNORMAL LOW (ref 4.22–5.81)
RDW: 13.5 % (ref 11.5–15.5)
WBC Count: 15 10*3/uL — ABNORMAL HIGH (ref 4.0–10.5)
nRBC: 0 % (ref 0.0–0.2)

## 2020-01-04 MED ORDER — INSULIN REGULAR HUMAN 100 UNIT/ML IJ SOLN
INTRAMUSCULAR | Status: AC
  Filled 2020-01-04: qty 1

## 2020-01-04 MED ORDER — INSULIN REGULAR HUMAN 100 UNIT/ML IJ SOLN
10.0000 [IU] | Freq: Once | INTRAMUSCULAR | Status: AC
  Administered 2020-01-04: 10 [IU] via SUBCUTANEOUS

## 2020-01-04 NOTE — Progress Notes (Signed)
[place] [phone]   [phone]  CONSULT NOTE  REFERRING PHYSICIAN: Dr. Burr Medico  REASON FOR CONSULTATION:  Small Cell Lung Cancer  HPI Dennis Reon Hunley. is a 59 y.o. male with a past medical history significant for congestive heart failure, CVA to the MCA in 2016, diabetes mellitus, and history of tobacco and drug abuse is referred to the clinic for evaluation of newly diagnosed extensive stage small cell lung cancer.  The patient was first seen in the emergency room on December 23, 2019 for the chief complaint of 5 days of intermittent left lower extremity weakness.  A CT scan of the head was performed which showed a 1.4 x 1.5 x 1.5 posterior right frontal lobe lung mass with vasogenic edema.  The patient is currently taking 4 mg of Decadron twice daily.  The patient then had a CT scan of the chest, abdomen, and pelvis performed which noted a spiculated 1.5x 0.7 cm posterior right middle lobe pulmonary nodule abutting and distorting the right major fissure.  The scan also noted a solitary mildly enlarged aortopulmonary mediastinal lymph node.  There is an indeterminate right adrenal 3.2 cm nodule which is mildly increased compared to 2012, but more likely thought to be a slow-growing adenoma.  The patient had a bronchoscopy performed by Dr. Valeta Harms on 12/28/2019.  Pathology was consistent with small cell lung cancer.  The patient is scheduled to meet with Dr. Vincent Gros later today for a CT simulation to initiate his whole brain radiation.  Per chart review, it appears that the patient's last day of radiation is anticipated to be 01/17/2020.  Today the patient is feeling " fine physically" except for the left leg weakness.  He denies any headache or visual changes.  He denies any chest pain, shortness of breath, cough, or hemoptysis.  He denies any nausea, vomiting, diarrhea, or constipation.  He denies any fever, chills, night sweats, or weight loss.   The  patient's family medical history consists of a mother who had diabetes and coronary artery disease status post CABG.  The patient's maternal grandmother had diabetes.  The patient's father has Parkinson's, diabetes, hypertension, coronary artery disease, and a history of prostate cancer.  The patient has 3 aunts who had cancer of unknown primary.  The patient is not married and has a 75 year old daughter.  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  He has a prior history of drug, alcohol, and tobacco use.  He has been clean of alcohol and drugs since 2016.  He states he used to drink heavily approximately half a gallon of alcohol over the course of Thursday-Sunday.  He has a prior history of cocaine and marijuana use.  He smoked 1 pack a day for 40 years.  He has not smoked since being discharged in the hospital.  HPI  Past Medical History:  Diagnosis Date  . Alcohol abuse with alcohol-induced mood disorder ([place]) 08/18/2015  . Automatic implantable cardioverter-defibrillator in situ 2012   S/P St Jude Dual Chamber. ICD.  Marland Kitchen CAD (coronary artery disease) 11/16/2014   CAD Coronary angiography (12/2008) with mid LAD totally occluded and collateralized.  . Cardiac LV ejection fraction 10-20%   . Chest pain 09/04/2018  . CHF (congestive heart failure) ([place])   . Chronic systolic heart failure (HCC)    a) Mixed ICM/NICM b) RHC (05/2014): RA 2, RV 19/2/3, PA 22/14 (18), PCWP 6, Fick CO/CI: 5.2 / 2.7, PVR 2.3  WU, PA 60% and 64% c) ECHO (05/2014): EF 20-25%, diff HK, akinesis entireanteroseptal myocardium, triv AI, mod MR, LA mod/sev dilated  . Cocaine abuse ([place]) 01/24/2015  . Cocaine abuse with cocaine-induced mood disorder ([place]) 08/20/2015  . Drug abuse and dependence ([place])   . Dysphagia following cerebral infarction 01/24/2015  . Embolic stroke involving right middle cerebral artery ([place])   . H/O noncompliance with medical treatment, presenting hazards to health 01/24/2015  .  Heart murmur   . High cholesterol   . Ischemic cardiomyopathy    a) Coronary angiography (12/2008) at [facility]: [place]: nl, LAD mid 100% stenosis with left to left and right-to-left collaterals to the distal LAD; Lcx: nl, RCA nl.    . 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[place])   . MDD (major depressive disorder), recurrent severe, without psychosis ([place]) 08/18/2015  . Open wound of foot 02/27/2015  . Pneumonia 1988  . Psychoactive substance-induced mood disorder ([place]) 07/17/2015  . Sleep apnea    "cleared after T&A"  . Status post PICC central line placement   . Stroke ([place])   . Substance induced mood disorder ([place]) 08/17/2015  . Type II diabetes mellitus ([place])     Past Surgical History:  Procedure Laterality Date  . BRONCHIAL BRUSHINGS  12/28/2019   Procedure: BRONCHIAL BRUSHINGS;  Surgeon: Garner Nash, DO;  Location: [facility];  Service: Thoracic;;  . BRONCHIAL NEEDLE ASPIRATION BIOPSY  12/28/2019   Procedure: BRONCHIAL NEEDLE ASPIRATION BIOPSIES;  Surgeon: Garner Nash, DO;  Location: [facility];  Service: Thoracic;;  . BRONCHIAL WASHINGS  12/28/2019   Procedure: BRONCHIAL WASHINGS;  Surgeon: Garner Nash, DO;  Location: [facility];  Service: Thoracic;;  . CARDIAC CATHETERIZATION  05/2014  . CARDIAC DEFIBRILLATOR PLACEMENT  03/2011  . CHOLECYSTECTOMY  1994  . FOREARM FRACTURE SURGERY Left 1980  . FRACTURE SURGERY    . RIGHT HEART CATHETERIZATION N/A 05/30/2014   Procedure: RIGHT HEART CATH;  Surgeon: Jolaine Artist, MD;  Location[facility] CATH LAB;  Service: Cardiovascular;  Laterality: N/A;  . RIGHT HEART CATHETERIZATION N/A 02/07/2015   Procedure: RIGHT HEART CATH;  Surgeon: Jolaine Artist, MD;  Location: [facility] CATH LAB;  Service: Cardiovascular;  Laterality: N/A;  . TONSILLECTOMY AND ADENOIDECTOMY  ~ 1998  . VIDEO BRONCHOSCOPY WITH ENDOBRONCHIAL ULTRASOUND N/A 12/28/2019   Procedure: VIDEO BRONCHOSCOPY WITH ENDOBRONCHIAL ULTRASOUND;  Surgeon: Garner Nash, DO;  Location:  [facility];  Service: Thoracic;  Laterality: N/A;    Family History  Problem Relation Age of Onset  . Diabetes Mother   . Heart disease Mother   . Diabetes Father   . Prostate cancer Father   . Heart disease Father   . Diabetes Brother   . Diabetes Brother     Social History Social History   Tobacco Use  . Smoking status: Current Some Day Smoker    Packs/day: 0.25    Years: 31.00    Pack years: 7.75    Types: Cigarettes  . Smokeless tobacco: Never Used  Substance Use Topics  . Alcohol use: Yes    Alcohol/week: 0.0 standard drinks    Comment: 09/21/2014 "might have a drink q 6 /months, if that"  . Drug use: Not Currently    Types: Cocaine    Comment: clean since 2016    No Known Allergies  Current Outpatient Medications  Medication Sig Dispense Refill  . atorvastatin (LIPITOR) 20 MG tablet TAKE 1 TABLET BY MOUTH DAILY AT 6 PM. (Patient taking differently: Take 20 mg  by mouth daily at 6 PM. ) 30 tablet 6  . carvedilol (COREG) 6.25 MG tablet Take 1.5 tablets (9.375 mg total) by mouth 2 (two) times daily with a meal. 90 tablet 3  . dexamethasone (DECADRON) 4 MG tablet Take 1 tablet (4 mg total) by mouth 2 (two) times daily. Please also instruct pt on taking omeprazole once daily while taking steroids 60 tablet 0  . empagliflozin (JARDIANCE) 10 MG TABS tablet Take 10 mg by mouth daily before breakfast. 30 tablet 6  . Insulin Glargine (LANTUS SOLOSTAR) 100 UNIT/ML Solostar Pen Inject 32 Units into the skin daily at 10 pm.     . memantine (NAMENDA) 5 MG tablet Start on 1st day of radiation. Week 1: take one po daily. Week 2: take one po twice daily. Week 3: take two po qam, and one po q pm. Week 4: take 2 po twice daily for remaining 20 weeks. 630 tablet 0  . metFORMIN (GLUCOPHAGE) 500 MG tablet Take 500 mg by mouth 2 (two) times daily with a meal.    . sacubitril-valsartan (ENTRESTO) 49-51 MG Take 1 tablet by mouth 2 (two) times daily. 60 tablet 0  . spironolactone  (ALDACTONE) 25 MG tablet Take 1 tablet (25 mg total) by mouth daily. 90 tablet 3  . warfarin (COUMADIN) 7.5 MG tablet Take 1/2 or 1 tablet daily as directed by Coumadin clinic (Patient taking differently: Take 3.75-7.5 mg by mouth See admin instructions. Take 1/2  (3.75) mg on Monday , Wed, Friday and 1 tablet 7.5mg  all other days. directed by Coumadin clinic) 30 tablet 0   No current facility-administered medications for this visit.    Review of Systems REVIEW OF SYSTEMS:   Review of Systems  Constitutional: Negative for appetite change, chills, fatigue, fever and unexpected weight change.  HENT:   Negative for mouth sores, nosebleeds, sore throat and trouble swallowing.   Eyes: Negative for eye problems and icterus.  Respiratory: Negative for cough, hemoptysis, shortness of breath and wheezing.  Cardiovascular: Negative for chest pain and leg swelling.  Gastrointestinal: Negative for abdominal pain, constipation, diarrhea, nausea and vomiting.  Genitourinary: Negative for bladder incontinence, difficulty urinating, dysuria, frequency and hematuria.   Musculoskeletal: Negative for back pain, gait problem, neck pain and neck stiffness.  Skin: Negative for itching and rash.  Neurological: Positive for left extremity weakness. Negative for dizziness,  gait problem, headaches, light-headedness and seizures.  Hematological: Negative for adenopathy. Does not bruise/bleed easily.  Psychiatric/Behavioral: Negative for confusion, depression and sleep disturbance. The patient is not nervous/anxious.     PHYSICAL EXAMINATION:  Blood pressure (!) 100/59, pulse 85, temperature 98.2 F (36.8 C), temperature source Temporal, resp. rate 18, height (!) 6" (0.152 m), weight 160 lb 6.4 oz (72.8 kg), SpO2 100 %.  ECOG PERFORMANCE STATUS: 1  Physical Exam  Constitutional: Oriented to person, place, and time and well-developed, well-nourished, and in no distress.  HENT:  Head: Normocephalic and  atraumatic.  Mouth/Throat: Oropharynx is clear and moist. No oropharyngeal exudate.  Eyes: Conjunctivae are normal. Right eye exhibits no discharge. Left eye exhibits no discharge. No scleral icterus.  Neck: Normal range of motion. Neck supple.  Cardiovascular: Normal rate, regular rhythm, normal heart sounds and intact distal pulses.   Pulmonary/Chest: Effort normal and breath sounds normal. No respiratory distress. No wheezes. No rales.  Abdominal: Soft. Bowel sounds are normal. Exhibits no distension and no mass. There is no tenderness.  Musculoskeletal: Normal range of motion. Exhibits no edema.  Lymphadenopathy:  No cervical adenopathy.  Neurological: Alert and oriented to person, place, and time. Exhibits normal muscle tone. Gait normal. Coordination normal.  Skin: Skin is warm and dry. No rash noted. Not diaphoretic. No erythema. No pallor.  Psychiatric: Mood, memory and judgment normal.  Vitals reviewed.  LABORATORY DATA: Lab Results  Component Value Date   WBC 15.0 (H) 01/04/2020   HGB 12.4 (L) 01/04/2020   HCT 38.4 (L) 01/04/2020   MCV 94.3 01/04/2020   PLT 268 01/04/2020      Chemistry      Component Value Date/Time   NA 131 (L) 01/04/2020 1228   K 4.6 01/04/2020 1228   CL 102 01/04/2020 1228   CO2 21 (L) 01/04/2020 1228   BUN 44 (H) 01/04/2020 1228   CREATININE 1.64 (H) 01/04/2020 1228      Component Value Date/Time   CALCIUM 9.1 01/04/2020 1228   ALKPHOS 82 01/04/2020 1228   AST 10 (L) 01/04/2020 1228   ALT 17 01/04/2020 1228   BILITOT 0.6 01/04/2020 1228       RADIOGRAPHIC STUDIES: CT HEAD WO CONTRAST  Result Date: 12/23/2019 CLINICAL DATA:  Left arm weakness since Monday or Tuesday. Fall on Wednesday night. EXAM: CT HEAD WITHOUT CONTRAST TECHNIQUE: Contiguous axial images were obtained from the base of the skull through the vertex without intravenous contrast. COMPARISON:  01/24/2015 FINDINGS: Brain: 14 mm dense intra-axial mass with moderate vasogenic  edema in the high right frontal lobe. Remote infarct in the right frontal operculum and insula. No acute hemorrhage or hydrocephalus. No acute infarct. Vascular: No hyperdense vessel. Skull: No evidence of fracture or bone lesion Sinuses/Orbits: Negative IMPRESSION: 1. 14 mm right frontal mass with vasogenic edema. Recommend brain MRI with contrast. 2. Chronic right insular and opercular infarct. Electronically Signed   By: Monte Fantasia M.D.   On: 12/23/2019 11:16   CT HEAD W CONTRAST  Result Date: 12/23/2019 CLINICAL DATA:  Cerebral mass. EXAM: CT HEAD WITH CONTRAST TECHNIQUE: Contiguous axial images were obtained from the base of the skull through the vertex with intravenous contrast. CONTRAST:  183mL OMNIPAQUE IOHEXOL 300 MG/ML  SOLN COMPARISON:  CT head without contrast 12/23/2019 FINDINGS: Brain: Postcontrast images confirm a homogeneously enhancing mass lesion in the posterior right frontal lobe measuring 1.4 x 1.5 x 1.5 cm. Surrounding vasogenic edema is present with partial effacement of sulci. No significant midline shift is present. No other foci of enhancement are present. Remote infarcts involving the right insular cortex as well as the anterior frontal lobe and precentral gyrus are again noted. The brainstem and cerebellum are within normal limits. The left hemisphere is unremarkable. Vascular: Contrast is present in the major vascular structures. Skull: Calvarium is intact. No focal lytic or blastic lesions are present. No significant extracranial soft tissue lesion is present. Sinuses/Orbits: The paranasal sinuses and mastoid air cells are clear. The globes and orbits are within normal limits. IMPRESSION: 1. 1.4 x 1.5 x 1.5 cm homogeneously enhancing mass lesion in the posterior right frontal lobe with surrounding vasogenic edema and partial effacement of sulci. This most likely represents a solitary metastasis. Primary brain tumor is not excluded. 2. No significant midline shift. 3. Remote  infarcts involving the right insular cortex and anterior frontal lobe and precentral gyrus. Electronically Signed   By: [place] Morelle M.D.   On: 12/23/2019 19:12   CT CHEST W CONTRAST  Result Date: 12/23/2019 CLINICAL DATA:  Inpatient. Right frontal lobe cerebral mass with vasogenic edema on head CT. Assess for  primary neoplasm. EXAM: CT CHEST, ABDOMEN, AND PELVIS WITH CONTRAST TECHNIQUE: Multidetector CT imaging of the chest, abdomen and pelvis was performed following the standard protocol during bolus administration of intravenous contrast. CONTRAST:  133mL OMNIPAQUE IOHEXOL 300 MG/ML  SOLN COMPARISON:  06/25/2011 chest CT angiogram and 06/26/2011 CT abdomen. FINDINGS: CT CHEST FINDINGS Cardiovascular: Normal heart size. No significant pericardial effusion/thickening. Two lead left subclavian ICD is noted with lead tips in the right atrium and right ventricular apex. Left anterior descending coronary atherosclerosis. Atherosclerotic nonaneurysmal thoracic aorta. Normal caliber pulmonary arteries. No central pulmonary emboli. Mediastinum/Nodes: No discrete thyroid nodules. Unremarkable esophagus. No axillary adenopathy. Mildly enlarged 1.1 cm aortopulmonary node (series 3/image 23), new since 2012. No additional pathologically enlarged mediastinal nodes. No pathologically enlarged hilar nodes. Lungs/Pleura: No pneumothorax. No pleural effusion. Moderate centrilobular and paraseptal emphysema. No acute consolidative airspace disease or lung masses. Irregular spiculated posterior right middle lobe 1.5 x 0.7 cm pulmonary nodule abutting and distorting the right major fissure (series 5/image 97), which may represent growth of a 0.5 cm nodule seen in this location on 06/24/2011 chest CT. No additional significant pulmonary nodules. Parenchymal banding at the dependent left lung base compatible with nonspecific postinfectious/postinflammatory scarring. Musculoskeletal: No aggressive appearing focal osseous  lesions. Moderate symmetric bilateral gynecomastia is unchanged. CT ABDOMEN PELVIS FINDINGS Hepatobiliary: Normal liver with no liver mass. Cholecystectomy. No biliary ductal dilatation. Pancreas: Normal, with no mass or duct dilation. Spleen: Normal size. No mass. Adrenals/Urinary Tract: Right adrenal 3.2 cm nodule with density 45 HU minimally increased from 2.8 cm on 06/24/2011 CT. No left adrenal nodules. No hydronephrosis. Subcentimeter hypodense renal cortical lesion in the upper right kidney is too small to characterize and requires no follow-up. Scattered mild renal cortical scarring in the kidneys bilaterally. Normal bladder. Stomach/Bowel: Normal non-distended stomach. Normal caliber small bowel with no small bowel wall thickening. Appendix not discretely visualized. Normal large bowel with no diverticulosis, large bowel wall thickening or pericolonic fat stranding. Vascular/Lymphatic: Atherosclerotic nonaneurysmal abdominal aorta. Patent portal, splenic, hepatic and renal veins. No pathologically enlarged lymph nodes in the abdomen or pelvis. Reproductive: Top-normal size prostate. Other: No pneumoperitoneum, ascites or focal fluid collection. Musculoskeletal: No aggressive appearing focal osseous lesions. Healed deformity in the lateral left iliac wing. Moderate degenerative disc disease at L5-S1 with unilateral right L5 pars defect. IMPRESSION: 1. Spiculated 1.5 x 0.7 cm posterior right middle lobe pulmonary nodule abutting and distorting the right major fissure, suspicious for primary bronchogenic carcinoma. 2. Solitary mildly enlarged aortopulmonary mediastinal lymph node. Metastatic disease not excluded. 3. Indeterminate right adrenal 3.2 cm nodule, mildly increased since 2012 CT, more likely a slow growing adenoma. 4. Suggest short-term outpatient PET-CT for further staging evaluation. 5. Aortic Atherosclerosis (ICD10-I70.0) and Emphysema (ICD10-J43.9). Electronically Signed   By: Ilona Sorrel M.D.    On: 12/23/2019 18:57   CT ABDOMEN PELVIS W CONTRAST  Result Date: 12/23/2019 CLINICAL DATA:  Inpatient. Right frontal lobe cerebral mass with vasogenic edema on head CT. Assess for primary neoplasm. EXAM: CT CHEST, ABDOMEN, AND PELVIS WITH CONTRAST TECHNIQUE: Multidetector CT imaging of the chest, abdomen and pelvis was performed following the standard protocol during bolus administration of intravenous contrast. CONTRAST:  19mL OMNIPAQUE IOHEXOL 300 MG/ML  SOLN COMPARISON:  06/25/2011 chest CT angiogram and 06/26/2011 CT abdomen. FINDINGS: CT CHEST FINDINGS Cardiovascular: Normal heart size. No significant pericardial effusion/thickening. Two lead left subclavian ICD is noted with lead tips in the right atrium and right ventricular apex. Left anterior descending coronary atherosclerosis. Atherosclerotic nonaneurysmal  thoracic aorta. Normal caliber pulmonary arteries. No central pulmonary emboli. Mediastinum/Nodes: No discrete thyroid nodules. Unremarkable esophagus. No axillary adenopathy. Mildly enlarged 1.1 cm aortopulmonary node (series 3/image 23), new since 2012. No additional pathologically enlarged mediastinal nodes. No pathologically enlarged hilar nodes. Lungs/Pleura: No pneumothorax. No pleural effusion. Moderate centrilobular and paraseptal emphysema. No acute consolidative airspace disease or lung masses. Irregular spiculated posterior right middle lobe 1.5 x 0.7 cm pulmonary nodule abutting and distorting the right major fissure (series 5/image 97), which may represent growth of a 0.5 cm nodule seen in this location on 06/24/2011 chest CT. No additional significant pulmonary nodules. Parenchymal banding at the dependent left lung base compatible with nonspecific postinfectious/postinflammatory scarring. Musculoskeletal: No aggressive appearing focal osseous lesions. Moderate symmetric bilateral gynecomastia is unchanged. CT ABDOMEN PELVIS FINDINGS Hepatobiliary: Normal liver with no liver mass.  Cholecystectomy. No biliary ductal dilatation. Pancreas: Normal, with no mass or duct dilation. Spleen: Normal size. No mass. Adrenals/Urinary Tract: Right adrenal 3.2 cm nodule with density 45 HU minimally increased from 2.8 cm on 06/24/2011 CT. No left adrenal nodules. No hydronephrosis. Subcentimeter hypodense renal cortical lesion in the upper right kidney is too small to characterize and requires no follow-up. Scattered mild renal cortical scarring in the kidneys bilaterally. Normal bladder. Stomach/Bowel: Normal non-distended stomach. Normal caliber small bowel with no small bowel wall thickening. Appendix not discretely visualized. Normal large bowel with no diverticulosis, large bowel wall thickening or pericolonic fat stranding. Vascular/Lymphatic: Atherosclerotic nonaneurysmal abdominal aorta. Patent portal, splenic, hepatic and renal veins. No pathologically enlarged lymph nodes in the abdomen or pelvis. Reproductive: Top-normal size prostate. Other: No pneumoperitoneum, ascites or focal fluid collection. Musculoskeletal: No aggressive appearing focal osseous lesions. Healed deformity in the lateral left iliac wing. Moderate degenerative disc disease at L5-S1 with unilateral right L5 pars defect. IMPRESSION: 1. Spiculated 1.5 x 0.7 cm posterior right middle lobe pulmonary nodule abutting and distorting the right major fissure, suspicious for primary bronchogenic carcinoma. 2. Solitary mildly enlarged aortopulmonary mediastinal lymph node. Metastatic disease not excluded. 3. Indeterminate right adrenal 3.2 cm nodule, mildly increased since 2012 CT, more likely a slow growing adenoma. 4. Suggest short-term outpatient PET-CT for further staging evaluation. 5. Aortic Atherosclerosis (ICD10-I70.0) and Emphysema (ICD10-J43.9). Electronically Signed   By: Ilona Sorrel M.D.   On: 12/23/2019 18:57   ECHOCARDIOGRAM COMPLETE  Result Date: 12/25/2019    ECHOCARDIOGRAM REPORT   Patient Name:   [facility] Date of  Exam: 12/25/2019 Medical Rec #:  [id number]   Height:       72.0 in Accession #:    [id number]  Weight:       170.0 lb Date of Birth:  01/20/1961    BSA:          1.988 m Patient Age:    32 years    BP:           123/85 mmHg Patient Gender: M           HR:           90 bpm. Exam Location:  Inpatient Procedure: 2D Echo, Color Doppler and Cardiac Doppler Indications:    I42.9 Cardiomyopathy (unspecified)  History:        Patient has prior history of Echocardiogram examinations, most                 recent 11/02/2018. CHF, Pacemaker and Defibrillator; Risk                 Factors:Hypertension, Diabetes and  Dyslipidemia.  Sonographer:    Raquel Sarna Senior RDCS Referring Phys: [place]  1. Left ventricular ejection fraction, by estimation, is 20 to 25%. The left ventricle has severely decreased function. The left ventricle demonstrates global hypokinesis. There is moderate asymmetric left ventricular hypertrophy of the inferolateral segment. Left ventricular diastolic parameters are indeterminate.  2. Right ventricular systolic function is normal. The right ventricular size is normal. Tricuspid regurgitation signal is inadequate for assessing PA pressure.  3. Left atrial size was moderately dilated.  4. The mitral valve is normal in structure. Mild mitral valve regurgitation. No evidence of mitral stenosis.  5. The aortic valve is tricuspid. Aortic valve regurgitation is trivial. No aortic stenosis is present.  6. The inferior vena cava is dilated in size with <50% respiratory variability, suggesting right atrial pressure of 15 mmHg. FINDINGS  Left Ventricle: Left ventricular ejection fraction, by estimation, is 20 to 25%. The left ventricle has severely decreased function. The left ventricle demonstrates global hypokinesis. The left ventricular internal cavity size was normal in size. There is moderate asymmetric left ventricular hypertrophy of the inferolateral segment. Left ventricular diastolic  parameters are indeterminate. Right Ventricle: The right ventricular size is normal. No increase in right ventricular wall thickness. Right ventricular systolic function is normal. Tricuspid regurgitation signal is inadequate for assessing PA pressure. Left Atrium: Left atrial size was moderately dilated. Right Atrium: Right atrial size was normal in size. Pericardium: There is no evidence of pericardial effusion. Mitral Valve: The mitral valve is normal in structure. Normal mobility of the mitral valve leaflets. Mild mitral valve regurgitation. No evidence of mitral valve stenosis. Tricuspid Valve: The tricuspid valve is normal in structure. Tricuspid valve regurgitation is trivial. No evidence of tricuspid stenosis. Aortic Valve: The aortic valve is tricuspid. Aortic valve regurgitation is trivial. No aortic stenosis is present. Pulmonic Valve: The pulmonic valve was normal in structure. Pulmonic valve regurgitation is trivial. No evidence of pulmonic stenosis. Aorta: The aortic root is normal in size and structure. Venous: The inferior vena cava is dilated in size with less than 50% respiratory variability, suggesting right atrial pressure of 15 mmHg. IAS/Shunts: No atrial level shunt detected by color flow Doppler. Additional Comments: A pacer wire is visualized.  LEFT VENTRICLE PLAX 2D LVIDd:         6.80 cm LVIDs:         5.90 cm LV PW:         1.40 cm LV IVS:        0.90 cm LVOT diam:     2.47 cm LV SV:         62 LV SV Index:   31 LVOT Area:     4.79 cm  LV Volumes (MOD) LV vol d, MOD A2C: 216.0 ml LV vol d, MOD A4C: 220.0 ml LV vol s, MOD A2C: 164.0 ml LV vol s, MOD A4C: 179.0 ml LV SV MOD A2C:     52.0 ml LV SV MOD A4C:     220.0 ml LV SV MOD BP:      54.3 ml RIGHT VENTRICLE RV S prime:     12.60 cm/s TAPSE (M-mode): 2.1 cm LEFT ATRIUM             Index       RIGHT ATRIUM           Index LA diam:        3.50 cm 1.76 cm/m  RA Area:     18.80  cm LA Vol (A2C):   85.7 ml 43.10 ml/m RA Volume:   47.80 ml   24.04 ml/m LA Vol (A4C):   76.6 ml 38.52 ml/m LA Biplane Vol: 83.6 ml 42.04 ml/m  AORTIC VALVE LVOT Vmax:   70.50 cm/s LVOT Vmean:  51.300 cm/s LVOT VTI:    0.130 m  AORTA Ao Root diam: 3.50 cm Ao Asc diam:  3.30 cm  SHUNTS Systemic VTI:  0.13 m Systemic Diam: 2.47 cm Cherlynn Kaiser MD Electronically signed by Cherlynn Kaiser MD Signature Date/Time: 12/25/2019/1:27:41 PM    Final    CUP PACEART REMOTE DEVICE CHECK  Result Date: 12/20/2019 Scheduled remote reviewed.  Normal device function. Battery longevity 9.43mths. AF burden <1%, EGM's show brief noise as seen previously. OAC- Warfarin Next remote 91 days- [facility], RN/CVRS   ASSESSMENT: This is a very pleasant 59 year old African-American male recently diagnosed with extensive stage small cell lung cancer.  He presented with a spiculated right middle lobe pulmonary nodule and mildly enlarged solitary aortopulmonary lymph node.  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  He was diagnosed in March 2021.   PLAN: The patient was seen with Dr. Julien Nordmann today.  Dr. Julien Nordmann had a lengthy discussion with the patient by his current condition and treatment options.  Dr. Julien Nordmann discussed that his condition is not curable but is treatable and recommends systemic chemotherapy with carboplatin for an AUC of 5 on day 1, etoposide 100 mg/m on days 1, 2, and 3, and Imfinzi 1500 mg on day 1 IV every 3 weeks.  The patient is interested in this option.  However, given that the patient does not have bulky disease, we will not start his chemotherapy until completion of his whole brain radiation which is expected to be finished around 01/17/20.   We will see the patient back for a follow-up visit in 2 weeks for more detailed discussion about his recommended treatment option.   In the meantime, I will arrange for the patient to have his initial staging PET scan to assess for other sites of tumor involvement.   The patient has  diabetes mellitus.  His blood work was noted to be elevated on routine labs today at 534.  The patient will receive 10 units of insulin while in the clinic today.  He is advised to monitor his blood sugar closely at home.   The patient voices understanding of current disease status and treatment options and is in agreement with the current care plan.  All questions were answered. The patient knows to call the clinic with any problems, questions or concerns. We can certainly see the patient much sooner if necessary.  Thank you so much for allowing me to participate in the care of [place].. I will continue to follow up the patient with you and assist in his care.  The total time spent in the appointment was 60 minutes.  Disclaimer: This note was dictated with voice recognition software. Similar sounding words can inadvertently be transcribed and may not be corrected upon review.   Analeese Andreatta L Lavita Pontius January 04, 2020, 2:01 PM  ADDENDUM: Hematology/Oncology Attending: I had a face-to-face encounter with the patient today.  I recommended his care plan.  This is a very pleasant 59 years old African-American male with long history of smoking who was admitted with strokelike symptoms including weakness in the left arm.  Imaging studies including CT scan of the head with and without  contrast showed 1.4 x 1.5 x 1.5 cm homogeneously enhancing mass lesion in the posterior right frontal lobe with surrounding vasogenic edema and partial enhancement of the sulci suspicious for solitary metastasis with no significant midline shift.  The patient had CT scan of the chest, abdomen pelvis performed on December 23, 2019 and it showed a spiculated 1.5 x 0.7 cm procedure right middle lobe pulmonary nodule abutting and distorting the right major fissure suspicious for primary bronchogenic carcinoma.  There was solitary mildly enlarged AP window mediastinal lymph node and indeterminate right adrenal 3.2 cm nodule  mildly increased from previous scan.  The patient underwent video bronchoscopy with endobronchial ultrasound under the care of Dr. Valeta Harms and the final pathology from fine-needle aspiration of level 7 lymph node showed malignant cells consistent with small cell carcinoma.  Also brushing of the left lung showed the same diagnosis.  The patient was referred to me today for evaluation and recommendation regarding treatment of his condition. When seen today the patient is feeling much better.  He is scheduled to see radiation oncology later today for evaluation and discussion of the radiotherapy option for his brain. I had a lengthy discussion with the patient today about his current disease stage, prognosis and treatment options.  The patient has extensive stage (T1b, N2, M1 C) small cell lung cancer. I recommended for the patient to proceed with whole brain radiation as recommended by Dr. Lisbeth Renshaw. I will see the patient back for follow-up visit in around 2 weeks for evaluation and more detailed discussion of his systemic treatment options.  The patient may benefit from systemic therapy with carboplatin for AUC of 5 on day 1, etoposide 100 mg/M2 on days 1, 2 and 3 and Imfinzi 1500 mg every 3 weeks. I briefly discussed the adverse effect of this treatment with the patient.  He is interested in treatment.  He will come back for follow-up visit close to the completion of his radiotherapy for more detailed discussion of this treatment options. The patient was strongly advised to continue to quit smoking. He was advised to call immediately if he has any other concerning symptoms in the interval.  Disclaimer: This note was dictated with voice recognition software. Similar sounding words can inadvertently be transcribed and may be missed upon review. Eilleen Kempf, MD 01/04/20

## 2020-01-04 NOTE — Patient Instructions (Addendum)
Regular Insulin injection What is this medicine? REGULAR INSULIN (REG yuh ler IN su lin) is a human-made form of insulin. This medicine lowers the amount of sugar in your blood. It is a short-acting insulin that starts working about 30 minutes after it is injected. This medicine may be used for other purposes; ask your health care provider or pharmacist if you have questions. COMMON BRAND NAME(S): Humulin R, Novolin R, ReliOn, Velosulin BR What should I tell my health care provider before I take this medicine? They need to know if you have any of these conditions:  episodes of hypoglycemia  eye disease, vision problems  kidney disease  liver disease  an unusual or allergic reaction to insulin, metacresol, other medicines, foods, dyes, or preservatives  pregnant or trying to get pregnant  breast-feeding How should I use this medicine? This medicine is for injection under the skin. Use exactly as directed. It is important to follow the directions given to you by your doctor or health care professional. Your doctor or health care professional will tell you how long to wait after you inject your dose before eating a meal. Most of the time, you should eat a meal within 30 minutes of your injection. You will be taught how to use this medicine and how to adjust doses for activities and illness. Do not use more insulin than prescribed. Do not use more or less often than prescribed. If you use U-500 insulin: Make sure you are using the right insulin vial prior to each use. You should only use a U-500 insulin syringe. Do not use a U-100 insulin syringe or a tuberculin syringe. The markings on each syringe are different. If you do not use the right syringe, you may take the wrong dose. This can lead to serious side effects. Always check the appearance of your insulin before using it. This medicine should be clear and colorless like water. Do not use it if it is cloudy, thickened, colored, or has solid  particles in it. If you use a pen, be sure to take off the outer needle cover before using the dose. It is important that you put your used needles and syringes in a special sharps container. Do not put them in a trash can. If you do not have a sharps container, call your pharmacist or healthcare provider to get one. This drug comes with INSTRUCTIONS FOR USE. Ask your pharmacist for directions on how to use this drug. Read the information carefully. Talk to your pharmacist or health care provider if you have questions. Talk to your pediatrician regarding the use of this medicine in children. While this medicine may be prescribed for children for selected conditions, precautions do apply. Overdosage: If you think you have taken too much of this medicine contact a poison control center or emergency room at once. NOTE: This medicine is only for you. Do not share this medicine with others. What if I miss a dose? It is important not to miss a dose. Your health care professional or doctor should discuss a plan for missed doses with you. If you do miss a dose, follow their plan. Do not take double doses. What may interact with this medicine?  other medicines for diabetes Many medications may cause an increase or decrease in blood sugar, these include:  alcohol containing beverages  aspirin and aspirin-like drugs  chloramphenicol  chromium  diuretics  male hormones, like estrogens or progestins and birth control pills  heart medicines  isoniazid  male  hormones or anabolic steroids  medicines for weight loss  medicines for allergies, asthma, cold, or cough  medicines for mental problems  medicines called MAO Inhibitors like Nardil, Parnate, Marplan, Eldepryl  niacin  NSAIDs, medicines for pain and inflammation, like ibuprofen or naproxen  pentamidine  phenytoin  probenecid  quinolone antibiotics like ciprofloxacin, levofloxacin, ofloxacin  some herbal dietary  supplements  steroid medicines like prednisone or cortisone  thyroid medicine Some medications can hide the warning symptoms of low blood sugar. You may need to monitor your blood sugar more closely if you are taking one of these medications. These include:  beta-blockers such as atenolol, metoprolol, propranolol  clonidine  guanethidine  reserpine This list may not describe all possible interactions. Give your health care provider a list of all the medicines, herbs, non-prescription drugs, or dietary supplements you use. Also tell them if you smoke, drink alcohol, or use illegal drugs. Some items may interact with your medicine. What should I watch for while using this medicine? Visit your health care professional or doctor for regular checks on your progress. A test called the HbA1C (A1C) will be monitored. This is a simple blood test. It measures your blood sugar control over the last 2 to 3 months. You will receive this test every 3 to 6 months. Learn how to check your blood sugar. Learn the symptoms of low and high blood sugar and how to manage them. Always carry a quick-source of sugar with you in case you have symptoms of low blood sugar. Examples include hard sugar candy or glucose tablets. Make sure others know that you can choke if you eat or drink when you develop serious symptoms of low blood sugar, such as seizures or unconsciousness. They must get medical help at once. Tell your doctor or health care professional if you have high blood sugar. You might need to change the dose of your medicine. If you are sick or exercising more than usual, you might need to change the dose of your medicine. Do not skip meals. Ask your doctor or health care professional if you should avoid alcohol. Many nonprescription cough and cold products contain sugar or alcohol. These can affect blood sugar. Make sure that you have the right kind of syringe for the type of insulin you use. Try not to change  the brand and type of insulin or syringe unless your health care professional or doctor tells you to. Switching insulin brand or type can cause dangerously high or low blood sugar. Always keep an extra supply of insulin, syringes, and needles on hand. Use a syringe one time only. Throw away syringe and needle in a closed container to prevent accidental needle sticks. Insulin pens and cartridges should never be shared. Even if the needle is changed, sharing may result in passing of viruses like hepatitis or HIV. Each time you get a new box of pen needles, check to see if they are the same type as the ones you were trained to use. If not, ask your health care professional to show you how to use this new type properly. Wear a medical ID bracelet or chain, and carry a card that describes your disease and details of your medicine and dosage times. What side effects may I notice from receiving this medicine? Side effects that you should report to your doctor or health care professional as soon as possible:  allergic reactions like skin rash, itching or hives, swelling of the face, lips, or tongue  breathing problems  signs and symptoms of high blood sugar such as dizziness, dry mouth, dry skin, fruity breath, nausea, stomach pain, increased hunger or thirst, increased urination  signs and symptoms of low blood sugar such as feeling anxious, confusion, dizziness, increased hunger, unusually weak or tired, sweating, shakiness, cold, irritable, headache, blurred vision, fast heartbeat, loss of consciousness Side effects that usually do not require medical attention (report to your doctor or health care professional if they continue or are bothersome):  increase or decrease in fatty tissue under the skin due to overuse of a particular injection site  itching, burning, swelling, or rash at site where injected  muscle pain This list may not describe all possible side effects. Call your doctor for medical  advice about side effects. You may report side effects to FDA at 1-800-FDA-1088. Where should I keep my medicine? Keep out of the reach of children. Unopened Vials: Humulin R U-100 Vials: Store in a refrigerator between 2 and 8 degrees C (36 and 46 degrees F) or at room temperature below 30 degrees C (86 degrees F). Do not freeze or use if the insulin has been frozen. Protect from light and excessive heat. If stored at room temperature, the vial must be discarded after 31 days. Throw away any unopened and unused medicine that has been stored in the refrigerator after the expiration date. Humulin R U-500 Vials: Store in a refrigerator between 2 and 8 degrees C (36 and 46 degrees F) or at room temperature below 30 degrees C (86 degrees F). Do not freeze or use if the insulin has been frozen. Protect from light and excessive heat. If stored at room temperature, the vial must be discarded after 40 days. Throw away any unopened and unused medicine that has been stored in the refrigerator after the expiration date. Novolin R U-100 Vials: Store in a refrigerator between 2 and 8 degrees C (36 and 46 degrees F) or at room temperature below 25 degrees C (77 degrees F). Do not freeze or use if the insulin has been frozen. Protect from light and excessive heat. If stored at room temperature, the vial must be discarded after 42 days. Throw away any unopened and unused medicine that has been stored in the refrigerator after the expiration date. Unopened Pens: Humulin R U-500 KwikPens: Store in a refrigerator between 2 and 8 degrees C (36 and 46 degrees F) or at room temperature below 30 degrees C (86 degrees F). Do not freeze or use if the insulin has been frozen. Protect from light and excessive heat. If stored at room temperature, the pen must be discarded after 28 days. Throw away any unopened and unused medicine that has been stored in the refrigerator after the expiration date. Novolin R U-100 FlexPens: Store in a  refrigerator between 2 and 8 degrees C (36 and 46 degrees F) or at room temperature below 30 degrees C (86 degrees F). Do not freeze or use if the insulin has been frozen. Protect from light and excessive heat. If stored at room temperature, the pen must be discarded after 28 days. Throw away any unopened and unused medicine that has been stored in the refrigerator after the expiration date. Vials that you are using: Humulin R U-100 Vials: Store in a refrigerator or at room temperature below 30 degrees C (86 degrees F). Do not freeze. Keep away from heat and light. Throw the opened vial away after 31 days. Humulin R U-500 Vials: Store in a refrigerator or at room temperature  below 30 degrees C (86 degrees F). Do not freeze. Keep away from heat and light. Throw the opened vial away after 40 days. Novolin R U-100 Vials: Store at room temperature below 25 degrees C (77 degrees F). Do not refrigerate or freeze. Keep away from heat and light. Throw the opened vial away after 42 days. Pens that you are using: Humulin R U-500 KwikPens: Store at room temperature below 30 degrees C (86 degrees F). Do not refrigerate or freeze. Keep away from heat and light. Throw the pen away after 28 days, even if it still has insulin left in it. Novolin R U-100 FlexPens: Store at room temperature below 30 degrees C (86 degrees F). Do not refrigerate or freeze. Keep away from heat and light. Throw the pen away after 28 days, even if it still has insulin left in it. NOTE: This sheet is a summary. It may not cover all possible information. If you have questions about this medicine, talk to your doctor, pharmacist, or health care provider.  2020 Elsevier/Gold Standard (2019-06-14 08:18:34)

## 2020-01-05 ENCOUNTER — Other Ambulatory Visit: Payer: Self-pay

## 2020-01-05 ENCOUNTER — Ambulatory Visit: Payer: Medicare Other

## 2020-01-05 ENCOUNTER — Other Ambulatory Visit: Payer: Self-pay | Admitting: *Deleted

## 2020-01-05 ENCOUNTER — Ambulatory Visit
Admission: RE | Admit: 2020-01-05 | Discharge: 2020-01-05 | Disposition: A | Discharge status: Home / Self Care | Payer: Medicare Other | Source: Ambulatory Visit | Attending: Radiation Oncology | Admitting: Radiation Oncology

## 2020-01-05 ENCOUNTER — Ambulatory Visit: Payer: Medicare Other | Attending: Internal Medicine

## 2020-01-05 ENCOUNTER — Encounter: Payer: Self-pay | Admitting: *Deleted

## 2020-01-05 DIAGNOSIS — Z23 Encounter for immunization: Secondary | ICD-10-CM

## 2020-01-05 DIAGNOSIS — Z510 Encounter for antineoplastic radiation therapy: Secondary | ICD-10-CM | POA: Diagnosis not present

## 2020-01-05 NOTE — Progress Notes (Signed)
   [id number]  Name:  Dennis [name].    MRN: [id number] DOB: [birth year]  01/05/2020  [name] was observed post Covid-19 immunization for 15 minutes without incident. He was provided with Vaccine Information Sheet and instruction to access the V-Safe system.   [name] was instructed to call 911 with any severe reactions post vaccine: Marland Kitchen Difficulty breathing  . Swelling of face and throat  . A fast heartbeat  . A bad rash all over body  . Dizziness and weakness   Immunizations Administered    Name Date Dose VIS Date Route   Pfizer COVID-19 Vaccine 01/05/2020  9:23 AM 0.3 mL 09/23/2019 Intramuscular   Manufacturer: [place]   Lot: [id number]   [place]: [id number]

## 2020-01-05 NOTE — Progress Notes (Signed)
The proposed treatment discussed in cancer conference 01/05/20 is for discussion purpose and not a binding recommendation.  The patient was not physically examined nor present for their treatment options.  Therefore, final treatment plans cannot be decided.

## 2020-01-05 NOTE — Progress Notes (Signed)
Oncology Nurse Navigator Documentation  Oncology Nurse Navigator Flowsheets 01/05/2020  Navigator Location [facility]  Navigator Encounter Type Other/I followed up on Mr. Niccoli schedule. He is to have his PET on 4/6. I completed a scheduling message for patient to be called and scheduled with Cassie for a follow up on 4/8.   Barriers/Navigation Needs Coordination of Care  Interventions Coordination of Care  Acuity Level 2-Minimal Needs (1-2 Barriers Identified)  Coordination of Care Other  Time Spent with Patient 15

## 2020-01-06 ENCOUNTER — Ambulatory Visit
Admission: RE | Admit: 2020-01-06 | Discharge: 2020-01-06 | Disposition: A | Discharge status: Home / Self Care | Payer: Medicare Other | Source: Ambulatory Visit | Attending: Radiation Oncology | Admitting: Radiation Oncology

## 2020-01-06 ENCOUNTER — Ambulatory Visit: Payer: Medicare Other | Admitting: Radiation Oncology

## 2020-01-06 ENCOUNTER — Other Ambulatory Visit: Payer: Self-pay

## 2020-01-06 ENCOUNTER — Ambulatory Visit: Payer: Medicare Other

## 2020-01-06 ENCOUNTER — Telehealth: Payer: Self-pay | Admitting: Physician Assistant

## 2020-01-06 DIAGNOSIS — Z510 Encounter for antineoplastic radiation therapy: Secondary | ICD-10-CM | POA: Diagnosis not present

## 2020-01-06 NOTE — Telephone Encounter (Signed)
Scheduled per los. Called and left msg. Mailed pirntout.

## 2020-01-09 ENCOUNTER — Other Ambulatory Visit: Payer: Self-pay

## 2020-01-09 ENCOUNTER — Ambulatory Visit
Admission: RE | Admit: 2020-01-09 | Discharge: 2020-01-09 | Disposition: A | Discharge status: Home / Self Care | Payer: Medicare Other | Source: Ambulatory Visit | Attending: Radiation Oncology | Admitting: Radiation Oncology

## 2020-01-09 DIAGNOSIS — Z510 Encounter for antineoplastic radiation therapy: Secondary | ICD-10-CM | POA: Diagnosis not present

## 2020-01-10 ENCOUNTER — Encounter (HOSPITAL_COMMUNITY): Payer: Medicare Other

## 2020-01-10 ENCOUNTER — Other Ambulatory Visit: Payer: Self-pay

## 2020-01-10 ENCOUNTER — Ambulatory Visit
Admission: RE | Admit: 2020-01-10 | Discharge: 2020-01-10 | Disposition: A | Discharge status: Home / Self Care | Payer: Medicare Other | Source: Ambulatory Visit | Attending: Radiation Oncology | Admitting: Radiation Oncology

## 2020-01-10 DIAGNOSIS — Z510 Encounter for antineoplastic radiation therapy: Secondary | ICD-10-CM | POA: Diagnosis not present

## 2020-01-11 ENCOUNTER — Other Ambulatory Visit: Payer: Self-pay

## 2020-01-11 ENCOUNTER — Ambulatory Visit
Admission: RE | Admit: 2020-01-11 | Discharge: 2020-01-11 | Disposition: A | Discharge status: Home / Self Care | Payer: Medicare Other | Source: Ambulatory Visit | Attending: Radiation Oncology | Admitting: Radiation Oncology

## 2020-01-11 DIAGNOSIS — Z510 Encounter for antineoplastic radiation therapy: Secondary | ICD-10-CM | POA: Diagnosis not present

## 2020-01-12 ENCOUNTER — Other Ambulatory Visit: Payer: Self-pay

## 2020-01-12 ENCOUNTER — Ambulatory Visit
Admission: RE | Admit: 2020-01-12 | Discharge: 2020-01-12 | Disposition: A | Discharge status: Home / Self Care | Payer: Medicare Other | Source: Ambulatory Visit | Attending: Radiation Oncology | Admitting: Radiation Oncology

## 2020-01-12 DIAGNOSIS — C7931 Secondary malignant neoplasm of brain: Secondary | ICD-10-CM | POA: Insufficient documentation

## 2020-01-12 DIAGNOSIS — Z510 Encounter for antineoplastic radiation therapy: Secondary | ICD-10-CM | POA: Insufficient documentation

## 2020-01-13 ENCOUNTER — Other Ambulatory Visit: Payer: Self-pay

## 2020-01-13 ENCOUNTER — Ambulatory Visit
Admission: RE | Admit: 2020-01-13 | Discharge: 2020-01-13 | Disposition: A | Discharge status: Home / Self Care | Payer: Medicare Other | Source: Ambulatory Visit | Attending: Radiation Oncology | Admitting: Radiation Oncology

## 2020-01-13 DIAGNOSIS — Z510 Encounter for antineoplastic radiation therapy: Secondary | ICD-10-CM | POA: Diagnosis not present

## 2020-01-16 ENCOUNTER — Ambulatory Visit
Admission: RE | Admit: 2020-01-16 | Discharge: 2020-01-16 | Disposition: A | Discharge status: Home / Self Care | Payer: Medicare Other | Source: Ambulatory Visit | Attending: Radiation Oncology | Admitting: Radiation Oncology

## 2020-01-16 DIAGNOSIS — Z510 Encounter for antineoplastic radiation therapy: Secondary | ICD-10-CM | POA: Diagnosis not present

## 2020-01-17 ENCOUNTER — Encounter: Payer: Self-pay | Admitting: Radiation Oncology

## 2020-01-17 ENCOUNTER — Ambulatory Visit
Admission: RE | Admit: 2020-01-17 | Discharge: 2020-01-17 | Disposition: A | Discharge status: Home / Self Care | Payer: Medicare Other | Source: Ambulatory Visit | Attending: Radiation Oncology | Admitting: Radiation Oncology

## 2020-01-17 ENCOUNTER — Other Ambulatory Visit: Payer: Self-pay

## 2020-01-17 ENCOUNTER — Ambulatory Visit (HOSPITAL_COMMUNITY)
Admission: RE | Admit: 2020-01-17 | Discharge: 2020-01-17 | Disposition: A | Discharge status: Home / Self Care | Payer: Medicare Other | Source: Ambulatory Visit | Attending: Physician Assistant | Admitting: Physician Assistant

## 2020-01-17 ENCOUNTER — Telehealth (HOSPITAL_COMMUNITY): Payer: Self-pay | Admitting: *Deleted

## 2020-01-17 DIAGNOSIS — D3501 Benign neoplasm of right adrenal gland: Secondary | ICD-10-CM | POA: Diagnosis not present

## 2020-01-17 DIAGNOSIS — Z79899 Other long term (current) drug therapy: Secondary | ICD-10-CM | POA: Diagnosis not present

## 2020-01-17 DIAGNOSIS — R911 Solitary pulmonary nodule: Secondary | ICD-10-CM | POA: Insufficient documentation

## 2020-01-17 DIAGNOSIS — Z510 Encounter for antineoplastic radiation therapy: Secondary | ICD-10-CM | POA: Diagnosis not present

## 2020-01-17 DIAGNOSIS — C3490 Malignant neoplasm of unspecified part of unspecified bronchus or lung: Secondary | ICD-10-CM | POA: Diagnosis not present

## 2020-01-17 LAB — GLUCOSE, CAPILLARY: Glucose-Capillary: 220 mg/dL — ABNORMAL HIGH (ref 70–99)

## 2020-01-17 IMAGING — CT NM PET TUM IMG INITIAL (PI) SKULL BASE T - THIGH
1 of 8 series · 1 of 25 positions shown · non-contrast
Comparison: Chest CT [DATE]

CLINICAL DATA: Initial treatment strategy for metastatic small cell
lung cancer.

EXAM:
NUCLEAR MEDICINE PET SKULL BASE TO THIGH
TECHNIQUE: 8.05 mCi F-18 FDG was injected intravenously. Full-ring PET imaging
was performed from the skull base to thigh after the radiotracer. CT
data was obtained and used for attenuation correction and anatomic
localization.
Fasting blood glucose: 220 mg/dl

[Series 4: ct sk_thigh 5.0 b31f · axial · 5.0mm · 0.98mm/px · 1 of 239 slices shown]
[im 239/239  brain]
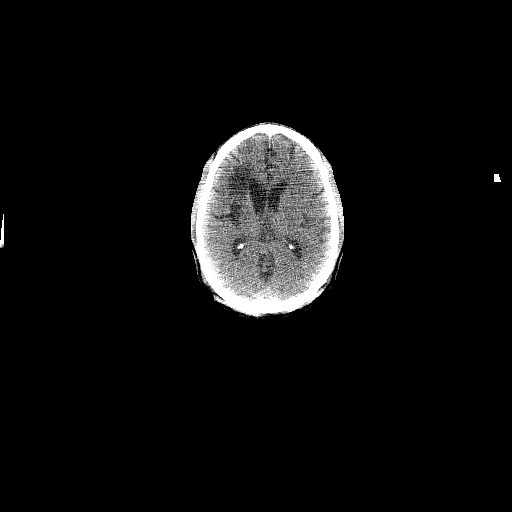

[1 of 25 positions shown; findings below may reference images not displayed]

FINDINGS: Mediastinal blood pool activity: SUV max

Liver activity: SUV max NA

NECK: No hypermetabolic lymph nodes in the neck.

Incidental CT findings: none

CHEST: The right middle lobe lesion is slightly smaller and less
well-defined than on the prior CT scan. It measures a maximum of 9
mm. No hypermetabolism is demonstrated. SUV max is 0.6.

Mild FDG uptake in the subcarinal node with SUV max of 3.27. No
other areas of abnormal FDG uptake in the mediastinum or hilum.

No supraclavicular or axillary adenopathy.

Stable underlying emphysematous changes and pulmonary scarring. No
other pulmonary nodules are identified. Stable left basilar scarring
changes.

Incidental CT findings: none

ABDOMEN/PELVIS: No abnormal hypermetabolic activity within the
liver, pancreas, adrenal glands, or spleen. No hypermetabolic lymph
nodes in the abdomen or pelvis.

Incidental CT findings: Benign 2.9 cm right adrenal gland adenoma.

Status post cholecystectomy.

Age advanced atherosclerotic calcifications involving the aorta and
iliac arteries.

Right renal calculus.

SKELETON: No focal hypermetabolic activity to suggest skeletal
metastasis.

Incidental CT findings: none
IMPRESSION: 1. The small right middle lobe lesion is non hypermetabolic. The
subcarinal lymph node is mildly hypermetabolic. No other sites of
adenopathy or metastatic disease are identified.
2. Benign right adrenal gland adenoma.

## 2020-01-17 MED ORDER — FLUDEOXYGLUCOSE F - 18 (FDG) INJECTION
8.0500 | Freq: Once | INTRAVENOUS | Status: AC | PRN
  Administered 2020-01-17: 8.05 via INTRAVENOUS

## 2020-01-17 NOTE — Telephone Encounter (Signed)
Pt left VM stating he will start chemotherapy on Thursday and was told to speak with his cardiologist about stopping warfarin while he is taking chemo because it can increase his chances of a brain bleed.Pt wants to know if he can stop warfarin.  Routed to [place] for advice

## 2020-01-18 NOTE — Telephone Encounter (Signed)
He has had a previous embolic stroke so would like to continue if at all possible but if risk of bleeding with chemo is high we will have to stop.

## 2020-01-18 NOTE — Progress Notes (Signed)
[facility] OFFICE PROGRESS NOTE  Default, Provider, MD No address on file  DIAGNOSIS: Extensive stage small cell lung cancer.  He presented with a spiculated right middle lobe pulmonary nodule and mildly enlarged solitary aortopulmonary lymph node.  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  He was diagnosed in March 2021.  PRIOR THERAPY:  1) Whole brain radiation under the care of Dr. Lisbeth Renshaw. Completed 01/17/2020  CURRENT THERAPY: Systemic chemotherapy with carboplatin for an AUC of 5 on day 1, etoposide 100 mg/m on days 1, 2, and 3, and Imfinzi 1500 mg on day 1 IV every 3 weeks. First dose expected on 01/31/20.    INTERVAL HISTORY: Dennis [name]. 59 y.o. male returns to the clinic for a follow up visit. He is feeling well today without any concerning complaint. He denies any headache or visual changes.  He denies any chest pain, cough, or hemoptysis.  He reports mild dyspnea on exertion. He denies any nausea, vomiting, diarrhea, or constipation.  He denies any fever, chills, night sweats, or weight loss.   He was diagnosed with extensive stage small cell lung cancer in March 2021. He was found to have a solitary brain metastasis and completed whole brain radiation under the care of Dr. Lisbeth Renshaw on 01/17/2020. He has returned to the clinic today to discuss his staging PET scan and to discuss his recommended treatment with systemic treatment in more detail.   MEDICAL HISTORY: Past Medical History:  Diagnosis Date  . Alcohol abuse with alcohol-induced mood disorder ([place]) 08/18/2015  . Automatic implantable cardioverter-defibrillator in situ 2012   S/P St Jude Dual Chamber. ICD.  Marland Kitchen CAD (coronary artery disease) 11/16/2014   CAD Coronary angiography (12/2008) with mid LAD totally occluded and collateralized.  . Cardiac LV ejection fraction 10-20%   . Chest pain 09/04/2018  . CHF (congestive heart failure) ([place])   . Chronic systolic heart  failure (HCC)    a) Mixed ICM/NICM b) RHC (05/2014): RA 2, RV 19/2/3, PA 22/14 (18), PCWP 6, Fick CO/CI: 5.2 / 2.7, PVR 2.3 WU, PA 60% and 64% c) ECHO (05/2014): EF 20-25%, diff HK, akinesis entireanteroseptal myocardium, triv AI, mod MR, LA mod/sev dilated  . Cocaine abuse ([place]) 01/24/2015  . Cocaine abuse with cocaine-induced mood disorder ([place]) 08/20/2015  . Drug abuse and dependence ([place])   . Dysphagia following cerebral infarction 01/24/2015  . Embolic stroke involving right middle cerebral artery ([place])   . Extensive stage primary small cell carcinoma of lung ([place]) 01/04/2020  . H/O noncompliance with medical treatment, presenting hazards to health 01/24/2015  . Heart murmur   . High cholesterol   . Ischemic cardiomyopathy    a) Coronary angiography (12/2008) at [facility]: [place]: nl, LAD mid 100% stenosis with left to left and right-to-left collaterals to the distal LAD; Lcx: nl, RCA nl.    . 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[place])   . MDD (major depressive disorder), recurrent severe, without psychosis ([place]) 08/18/2015  . Open wound of foot 02/27/2015  . Pneumonia 1988  . Psychoactive substance-induced mood disorder ([place]) 07/17/2015  . Sleep apnea    "cleared after T&A"  . Status post PICC central line placement   . Stroke ([place])   . Substance induced mood disorder ([place]) 08/17/2015  . Type II diabetes mellitus (HCC)     ALLERGIES:  has No Known Allergies.  MEDICATIONS:  Current Outpatient Medications  Medication Sig Dispense Refill  . atorvastatin (LIPITOR)  20 MG tablet TAKE 1 TABLET BY MOUTH DAILY AT 6 PM. (Patient taking differently: Take 20 mg by mouth daily at 6 PM. ) 30 tablet 6  . carvedilol (COREG) 6.25 MG tablet Take 1.5 tablets (9.375 mg total) by mouth 2 (two) times daily with a meal. 90 tablet 3  . dexamethasone (DECADRON) 4 MG tablet Take 1 tablet (4 mg total) by mouth 2 (two) times daily. Please also instruct pt on taking omeprazole once daily while taking steroids 60 tablet 0  .  empagliflozin (JARDIANCE) 10 MG TABS tablet Take 10 mg by mouth daily before breakfast. 30 tablet 6  . Insulin Glargine (LANTUS SOLOSTAR) 100 UNIT/ML Solostar Pen Inject 36 Units into the skin daily at 10 pm.     . memantine (NAMENDA) 5 MG tablet Start on 1st day of radiation. Week 1: take one po daily. Week 2: take one po twice daily. Week 3: take two po qam, and one po q pm. Week 4: take 2 po twice daily for remaining 20 weeks. 630 tablet 0  . metFORMIN (GLUCOPHAGE) 500 MG tablet Take 500 mg by mouth 2 (two) times daily with a meal.    . sacubitril-valsartan (ENTRESTO) 49-51 MG Take 1 tablet by mouth 2 (two) times daily. 60 tablet 0  . spironolactone (ALDACTONE) 25 MG tablet Take 1 tablet (25 mg total) by mouth daily. 90 tablet 3  . prochlorperazine (COMPAZINE) 10 MG tablet Take 1 tablet (10 mg total) by mouth every 6 (six) hours as needed. 30 tablet 2  . warfarin (COUMADIN) 7.5 MG tablet Take 1/2 or 1 tablet daily as directed by Coumadin clinic (Patient not taking: Reported on 01/19/2020) 30 tablet 0   No current facility-administered medications for this visit.    SURGICAL HISTORY:  Past Surgical History:  Procedure Laterality Date  . BRONCHIAL BRUSHINGS  12/28/2019   Procedure: BRONCHIAL BRUSHINGS;  Surgeon: Garner Nash, DO;  Location: [facility];  Service: Thoracic;;  . BRONCHIAL NEEDLE ASPIRATION BIOPSY  12/28/2019   Procedure: BRONCHIAL NEEDLE ASPIRATION BIOPSIES;  Surgeon: Garner Nash, DO;  Location: [place];  Service: Thoracic;;  . BRONCHIAL WASHINGS  12/28/2019   Procedure: BRONCHIAL WASHINGS;  Surgeon: Garner Nash, DO;  Location: [place];  Service: Thoracic;;  . CARDIAC CATHETERIZATION  05/2014  . CARDIAC DEFIBRILLATOR PLACEMENT  03/2011  . CHOLECYSTECTOMY  1994  . FOREARM FRACTURE SURGERY Left 1980  . FRACTURE SURGERY    . RIGHT HEART CATHETERIZATION N/A 05/30/2014   Procedure: RIGHT HEART CATH;  Surgeon: Jolaine Artist, MD;  Location[facility] CATH LAB;   Service: Cardiovascular;  Laterality: N/A;  . RIGHT HEART CATHETERIZATION N/A 02/07/2015   Procedure: RIGHT HEART CATH;  Surgeon: Jolaine Artist, MD;  Location: [facility] CATH LAB;  Service: Cardiovascular;  Laterality: N/A;  . TONSILLECTOMY AND ADENOIDECTOMY  ~ 1998  . VIDEO BRONCHOSCOPY WITH ENDOBRONCHIAL ULTRASOUND N/A 12/28/2019   Procedure: VIDEO BRONCHOSCOPY WITH ENDOBRONCHIAL ULTRASOUND;  Surgeon: Garner Nash, DO;  Location: [facility];  Service: Thoracic;  Laterality: N/A;    REVIEW OF SYSTEMS:   Review of Systems  Constitutional: Negative for appetite change, chills, fatigue, fever and unexpected weight change.  HENT: Negative for mouth sores, nosebleeds, sore throat and trouble swallowing.   Eyes: Negative for eye problems and icterus.  Respiratory: Positive for mild dyspnea on exertion. Negative for cough, hemoptysis, and wheezing.   Cardiovascular: Negative for chest pain and leg swelling.  Gastrointestinal: Negative for abdominal pain, constipation, diarrhea, nausea and  vomiting.  Genitourinary: Negative for bladder incontinence, difficulty urinating, dysuria, frequency and hematuria.   Musculoskeletal: Negative for back pain, gait problem, neck pain and neck stiffness.  Skin: Negative for itching and rash.  Neurological: Negative for dizziness, extremity weakness, gait problem, headaches, light-headedness and seizures.  Hematological: Negative for adenopathy. Does not bruise/bleed easily.  Psychiatric/Behavioral: Negative for confusion, depression and sleep disturbance. The patient is not nervous/anxious.     PHYSICAL EXAMINATION:  Blood pressure (!) 84/68, pulse 94, temperature 99.1 F (37.3 C), temperature source Temporal, resp. rate 20, height 6' (1.829 m), weight 155 lb 8 oz (70.5 kg), SpO2 99 %.  ECOG PERFORMANCE STATUS: 1 - Symptomatic but completely ambulatory  Physical Exam  Constitutional: Oriented to person, place, and time and well-developed, well-nourished,  and in no distress.   HENT:  Head: Normocephalic and atraumatic.  Mouth/Throat: Oropharynx is clear and moist. No oropharyngeal exudate.  Eyes: Conjunctivae are normal. Right eye exhibits no discharge. Left eye exhibits no discharge. No scleral icterus.  Neck: Normal range of motion. Neck supple.  Cardiovascular: Normal rate, regular rhythm, normal heart sounds and intact distal pulses.   Pulmonary/Chest: Effort normal and breath sounds normal. No respiratory distress. No wheezes. No rales.  Abdominal: Soft. Bowel sounds are normal. Exhibits no distension and no mass. There is no tenderness.  Musculoskeletal: Normal range of motion. Exhibits no edema.  Lymphadenopathy:    No cervical adenopathy.  Neurological: Alert and oriented to person, place, and time. Exhibits normal muscle tone. Gait normal. Coordination normal.  Skin: Skin is warm and dry. No rash noted. Not diaphoretic. No erythema. No pallor.  Psychiatric: Mood, memory and judgment normal.  Vitals reviewed.  LABORATORY DATA: Lab Results  Component Value Date   WBC 12.7 (H) 01/19/2020   HGB 11.8 (L) 01/19/2020   HCT 37.6 (L) 01/19/2020   MCV 95.7 01/19/2020   PLT 183 01/19/2020      Chemistry      Component Value Date/Time   NA 134 (L) 01/19/2020 1427   K 4.9 01/19/2020 1427   CL 106 01/19/2020 1427   CO2 23 01/19/2020 1427   BUN 45 (H) 01/19/2020 1427   CREATININE 1.68 (H) 01/19/2020 1427      Component Value Date/Time   CALCIUM 8.9 01/19/2020 1427   ALKPHOS 71 01/19/2020 1427   AST 11 (L) 01/19/2020 1427   ALT 25 01/19/2020 1427   BILITOT 0.4 01/19/2020 1427       RADIOGRAPHIC STUDIES:  CT HEAD WO CONTRAST  Result Date: 12/23/2019 CLINICAL DATA:  Left arm weakness since Monday or Tuesday. Fall on Wednesday night. EXAM: CT HEAD WITHOUT CONTRAST TECHNIQUE: Contiguous axial images were obtained from the base of the skull through the vertex without intravenous contrast. COMPARISON:  01/24/2015 FINDINGS: Brain:  14 mm dense intra-axial mass with moderate vasogenic edema in the high right frontal lobe. Remote infarct in the right frontal operculum and insula. No acute hemorrhage or hydrocephalus. No acute infarct. Vascular: No hyperdense vessel. Skull: No evidence of fracture or bone lesion Sinuses/Orbits: Negative IMPRESSION: 1. 14 mm right frontal mass with vasogenic edema. Recommend brain MRI with contrast. 2. Chronic right insular and opercular infarct. Electronically Signed   By: Monte Fantasia M.D.   On: 12/23/2019 11:16   CT HEAD W CONTRAST  Result Date: 12/23/2019 CLINICAL DATA:  Cerebral mass. EXAM: CT HEAD WITH CONTRAST TECHNIQUE: Contiguous axial images were obtained from the base of the skull through the vertex with intravenous contrast. CONTRAST:  156mL  OMNIPAQUE IOHEXOL 300 MG/ML  SOLN COMPARISON:  CT head without contrast 12/23/2019 FINDINGS: Brain: Postcontrast images confirm a homogeneously enhancing mass lesion in the posterior right frontal lobe measuring 1.4 x 1.5 x 1.5 cm. Surrounding vasogenic edema is present with partial effacement of sulci. No significant midline shift is present. No other foci of enhancement are present. Remote infarcts involving the right insular cortex as well as the anterior frontal lobe and precentral gyrus are again noted. The brainstem and cerebellum are within normal limits. The left hemisphere is unremarkable. Vascular: Contrast is present in the major vascular structures. Skull: Calvarium is intact. No focal lytic or blastic lesions are present. No significant extracranial soft tissue lesion is present. Sinuses/Orbits: The paranasal sinuses and mastoid air cells are clear. The globes and orbits are within normal limits. IMPRESSION: 1. 1.4 x 1.5 x 1.5 cm homogeneously enhancing mass lesion in the posterior right frontal lobe with surrounding vasogenic edema and partial effacement of sulci. This most likely represents a solitary metastasis. Primary brain tumor is not  excluded. 2. No significant midline shift. 3. Remote infarcts involving the right insular cortex and anterior frontal lobe and precentral gyrus. Electronically Signed   By: San Morelle M.D.   On: 12/23/2019 19:12   CT CHEST W CONTRAST  Result Date: 12/23/2019 CLINICAL DATA:  Inpatient. Right frontal lobe cerebral mass with vasogenic edema on head CT. Assess for primary neoplasm. EXAM: CT CHEST, ABDOMEN, AND PELVIS WITH CONTRAST TECHNIQUE: Multidetector CT imaging of the chest, abdomen and pelvis was performed following the standard protocol during bolus administration of intravenous contrast. CONTRAST:  174mL OMNIPAQUE IOHEXOL 300 MG/ML  SOLN COMPARISON:  06/25/2011 chest CT angiogram and 06/26/2011 CT abdomen. FINDINGS: CT CHEST FINDINGS Cardiovascular: Normal heart size. No significant pericardial effusion/thickening. Two lead left subclavian ICD is noted with lead tips in the right atrium and right ventricular apex. Left anterior descending coronary atherosclerosis. Atherosclerotic nonaneurysmal thoracic aorta. Normal caliber pulmonary arteries. No central pulmonary emboli. Mediastinum/Nodes: No discrete thyroid nodules. Unremarkable esophagus. No axillary adenopathy. Mildly enlarged 1.1 cm aortopulmonary node (series 3/image 23), new since 2012. No additional pathologically enlarged mediastinal nodes. No pathologically enlarged hilar nodes. Lungs/Pleura: No pneumothorax. No pleural effusion. Moderate centrilobular and paraseptal emphysema. No acute consolidative airspace disease or lung masses. Irregular spiculated posterior right middle lobe 1.5 x 0.7 cm pulmonary nodule abutting and distorting the right major fissure (series 5/image 97), which may represent growth of a 0.5 cm nodule seen in this location on 06/24/2011 chest CT. No additional significant pulmonary nodules. Parenchymal banding at the dependent left lung base compatible with nonspecific postinfectious/postinflammatory scarring.  Musculoskeletal: No aggressive appearing focal osseous lesions. Moderate symmetric bilateral gynecomastia is unchanged. CT ABDOMEN PELVIS FINDINGS Hepatobiliary: Normal liver with no liver mass. Cholecystectomy. No biliary ductal dilatation. Pancreas: Normal, with no mass or duct dilation. Spleen: Normal size. No mass. Adrenals/Urinary Tract: Right adrenal 3.2 cm nodule with density 45 HU minimally increased from 2.8 cm on 06/24/2011 CT. No left adrenal nodules. No hydronephrosis. Subcentimeter hypodense renal cortical lesion in the upper right kidney is too small to characterize and requires no follow-up. Scattered mild renal cortical scarring in the kidneys bilaterally. Normal bladder. Stomach/Bowel: Normal non-distended stomach. Normal caliber small bowel with no small bowel wall thickening. Appendix not discretely visualized. Normal large bowel with no diverticulosis, large bowel wall thickening or pericolonic fat stranding. Vascular/Lymphatic: Atherosclerotic nonaneurysmal abdominal aorta. Patent portal, splenic, hepatic and renal veins. No pathologically enlarged lymph nodes in the abdomen or pelvis.  Reproductive: Top-normal size prostate. Other: No pneumoperitoneum, ascites or focal fluid collection. Musculoskeletal: No aggressive appearing focal osseous lesions. Healed deformity in the lateral left iliac wing. Moderate degenerative disc disease at L5-S1 with unilateral right L5 pars defect. IMPRESSION: 1. Spiculated 1.5 x 0.7 cm posterior right middle lobe pulmonary nodule abutting and distorting the right major fissure, suspicious for primary bronchogenic carcinoma. 2. Solitary mildly enlarged aortopulmonary mediastinal lymph node. Metastatic disease not excluded. 3. Indeterminate right adrenal 3.2 cm nodule, mildly increased since 2012 CT, more likely a slow growing adenoma. 4. Suggest short-term outpatient PET-CT for further staging evaluation. 5. Aortic Atherosclerosis (ICD10-I70.0) and Emphysema  (ICD10-J43.9). Electronically Signed   By: Ilona Sorrel M.D.   On: 12/23/2019 18:57   CT ABDOMEN PELVIS W CONTRAST  Result Date: 12/23/2019 CLINICAL DATA:  Inpatient. Right frontal lobe cerebral mass with vasogenic edema on head CT. Assess for primary neoplasm. EXAM: CT CHEST, ABDOMEN, AND PELVIS WITH CONTRAST TECHNIQUE: Multidetector CT imaging of the chest, abdomen and pelvis was performed following the standard protocol during bolus administration of intravenous contrast. CONTRAST:  177mL OMNIPAQUE IOHEXOL 300 MG/ML  SOLN COMPARISON:  06/25/2011 chest CT angiogram and 06/26/2011 CT abdomen. FINDINGS: CT CHEST FINDINGS Cardiovascular: Normal heart size. No significant pericardial effusion/thickening. Two lead left subclavian ICD is noted with lead tips in the right atrium and right ventricular apex. Left anterior descending coronary atherosclerosis. Atherosclerotic nonaneurysmal thoracic aorta. Normal caliber pulmonary arteries. No central pulmonary emboli. Mediastinum/Nodes: No discrete thyroid nodules. Unremarkable esophagus. No axillary adenopathy. Mildly enlarged 1.1 cm aortopulmonary node (series 3/image 23), new since 2012. No additional pathologically enlarged mediastinal nodes. No pathologically enlarged hilar nodes. Lungs/Pleura: No pneumothorax. No pleural effusion. Moderate centrilobular and paraseptal emphysema. No acute consolidative airspace disease or lung masses. Irregular spiculated posterior right middle lobe 1.5 x 0.7 cm pulmonary nodule abutting and distorting the right major fissure (series 5/image 97), which may represent growth of a 0.5 cm nodule seen in this location on 06/24/2011 chest CT. No additional significant pulmonary nodules. Parenchymal banding at the dependent left lung base compatible with nonspecific postinfectious/postinflammatory scarring. Musculoskeletal: No aggressive appearing focal osseous lesions. Moderate symmetric bilateral gynecomastia is unchanged. CT ABDOMEN  PELVIS FINDINGS Hepatobiliary: Normal liver with no liver mass. Cholecystectomy. No biliary ductal dilatation. Pancreas: Normal, with no mass or duct dilation. Spleen: Normal size. No mass. Adrenals/Urinary Tract: Right adrenal 3.2 cm nodule with density 45 HU minimally increased from 2.8 cm on 06/24/2011 CT. No left adrenal nodules. No hydronephrosis. Subcentimeter hypodense renal cortical lesion in the upper right kidney is too small to characterize and requires no follow-up. Scattered mild renal cortical scarring in the kidneys bilaterally. Normal bladder. Stomach/Bowel: Normal non-distended stomach. Normal caliber small bowel with no small bowel wall thickening. Appendix not discretely visualized. Normal large bowel with no diverticulosis, large bowel wall thickening or pericolonic fat stranding. Vascular/Lymphatic: Atherosclerotic nonaneurysmal abdominal aorta. Patent portal, splenic, hepatic and renal veins. No pathologically enlarged lymph nodes in the abdomen or pelvis. Reproductive: Top-normal size prostate. Other: No pneumoperitoneum, ascites or focal fluid collection. Musculoskeletal: No aggressive appearing focal osseous lesions. Healed deformity in the lateral left iliac wing. Moderate degenerative disc disease at L5-S1 with unilateral right L5 pars defect. IMPRESSION: 1. Spiculated 1.5 x 0.7 cm posterior right middle lobe pulmonary nodule abutting and distorting the right major fissure, suspicious for primary bronchogenic carcinoma. 2. Solitary mildly enlarged aortopulmonary mediastinal lymph node. Metastatic disease not excluded. 3. Indeterminate right adrenal 3.2 cm nodule, mildly increased since 2012 CT,  more likely a slow growing adenoma. 4. Suggest short-term outpatient PET-CT for further staging evaluation. 5. Aortic Atherosclerosis (ICD10-I70.0) and Emphysema (ICD10-J43.9). Electronically Signed   By: Ilona Sorrel M.D.   On: 12/23/2019 18:57   NM PET Image Initial (PI) Skull Base To  Thigh  Result Date: 01/17/2020 CLINICAL DATA:  Initial treatment strategy for metastatic small cell lung cancer. EXAM: NUCLEAR MEDICINE PET SKULL BASE TO THIGH TECHNIQUE: 8.05 mCi F-18 FDG was injected intravenously. Full-ring PET imaging was performed from the skull base to thigh after the radiotracer. CT data was obtained and used for attenuation correction and anatomic localization. Fasting blood glucose: 220 mg/dl COMPARISON:  Chest CT 12/23/2019 FINDINGS: Mediastinal blood pool activity: SUV max 1.77 Liver activity: SUV max NA NECK: No hypermetabolic lymph nodes in the neck. Incidental CT findings: none CHEST: The right middle lobe lesion is slightly smaller and less well-defined than on the prior CT scan. It measures a maximum of 9 mm. No hypermetabolism is demonstrated. SUV max is 0.6. Mild FDG uptake in the subcarinal node with SUV max of 3.27. No other areas of abnormal FDG uptake in the mediastinum or hilum. No supraclavicular or axillary adenopathy. Stable underlying emphysematous changes and pulmonary scarring. No other pulmonary nodules are identified. Stable left basilar scarring changes. Incidental CT findings: none ABDOMEN/PELVIS: No abnormal hypermetabolic activity within the liver, pancreas, adrenal glands, or spleen. No hypermetabolic lymph nodes in the abdomen or pelvis. Incidental CT findings: Benign 2.9 cm right adrenal gland adenoma. Status post cholecystectomy. Age advanced atherosclerotic calcifications involving the aorta and iliac arteries. Right renal calculus. SKELETON: No focal hypermetabolic activity to suggest skeletal metastasis. Incidental CT findings: none IMPRESSION: 1. The small right middle lobe lesion is non hypermetabolic. The subcarinal lymph node is mildly hypermetabolic. No other sites of adenopathy or metastatic disease are identified. 2. Benign right adrenal gland adenoma. Electronically Signed   By: Marijo Sanes M.D.   On: 01/17/2020 10:32   ECHOCARDIOGRAM  COMPLETE  Result Date: 12/25/2019    ECHOCARDIOGRAM REPORT   Patient Name:   [facility] Date of Exam: 12/25/2019 Medical Rec #:  [id number]   Height:       72.0 in Accession #:    [id number]  Weight:       170.0 lb Date of Birth:  March 26, 1961    BSA:          1.988 m Patient Age:    25 years    BP:           123/85 mmHg Patient Gender: M           HR:           90 bpm. Exam Location:  Inpatient Procedure: 2D Echo, Color Doppler and Cardiac Doppler Indications:    I42.9 Cardiomyopathy (unspecified)  History:        Patient has prior history of Echocardiogram examinations, most                 recent 11/02/2018. CHF, Pacemaker and Defibrillator; Risk                 Factors:Hypertension, Diabetes and Dyslipidemia.  Sonographer:    Raquel Sarna Senior RDCS Referring Phys: [place]  1. Left ventricular ejection fraction, by estimation, is 20 to 25%. The left ventricle has severely decreased function. The left ventricle demonstrates global hypokinesis. There is moderate asymmetric left ventricular hypertrophy of the inferolateral segment. Left ventricular diastolic parameters are indeterminate.  2. Right ventricular  systolic function is normal. The right ventricular size is normal. Tricuspid regurgitation signal is inadequate for assessing PA pressure.  3. Left atrial size was moderately dilated.  4. The mitral valve is normal in structure. Mild mitral valve regurgitation. No evidence of mitral stenosis.  5. The aortic valve is tricuspid. Aortic valve regurgitation is trivial. No aortic stenosis is present.  6. The inferior vena cava is dilated in size with <50% respiratory variability, suggesting right atrial pressure of 15 mmHg. FINDINGS  Left Ventricle: Left ventricular ejection fraction, by estimation, is 20 to 25%. The left ventricle has severely decreased function. The left ventricle demonstrates global hypokinesis. The left ventricular internal cavity size was normal in size. There is moderate  asymmetric left ventricular hypertrophy of the inferolateral segment. Left ventricular diastolic parameters are indeterminate. Right Ventricle: The right ventricular size is normal. No increase in right ventricular wall thickness. Right ventricular systolic function is normal. Tricuspid regurgitation signal is inadequate for assessing PA pressure. Left Atrium: Left atrial size was moderately dilated. Right Atrium: Right atrial size was normal in size. Pericardium: There is no evidence of pericardial effusion. Mitral Valve: The mitral valve is normal in structure. Normal mobility of the mitral valve leaflets. Mild mitral valve regurgitation. No evidence of mitral valve stenosis. Tricuspid Valve: The tricuspid valve is normal in structure. Tricuspid valve regurgitation is trivial. No evidence of tricuspid stenosis. Aortic Valve: The aortic valve is tricuspid. Aortic valve regurgitation is trivial. No aortic stenosis is present. Pulmonic Valve: The pulmonic valve was normal in structure. Pulmonic valve regurgitation is trivial. No evidence of pulmonic stenosis. Aorta: The aortic root is normal in size and structure. Venous: The inferior vena cava is dilated in size with less than 50% respiratory variability, suggesting right atrial pressure of 15 mmHg. IAS/Shunts: No atrial level shunt detected by color flow Doppler. Additional Comments: A pacer wire is visualized.  LEFT VENTRICLE PLAX 2D LVIDd:         6.80 cm LVIDs:         5.90 cm LV PW:         1.40 cm LV IVS:        0.90 cm LVOT diam:     2.47 cm LV SV:         62 LV SV Index:   31 LVOT Area:     4.79 cm  LV Volumes (MOD) LV vol d, MOD A2C: 216.0 ml LV vol d, MOD A4C: 220.0 ml LV vol s, MOD A2C: 164.0 ml LV vol s, MOD A4C: 179.0 ml LV SV MOD A2C:     52.0 ml LV SV MOD A4C:     220.0 ml LV SV MOD BP:      54.3 ml RIGHT VENTRICLE RV S prime:     12.60 cm/s TAPSE (M-mode): 2.1 cm LEFT ATRIUM             Index       RIGHT ATRIUM           Index LA diam:        3.50  cm 1.76 cm/m  RA Area:     18.80 cm LA Vol (A2C):   85.7 ml 43.10 ml/m RA Volume:   47.80 ml  24.04 ml/m LA Vol (A4C):   76.6 ml 38.52 ml/m LA Biplane Vol: 83.6 ml 42.04 ml/m  AORTIC VALVE LVOT Vmax:   70.50 cm/s LVOT Vmean:  51.300 cm/s LVOT VTI:    0.130 m  AORTA Ao Root  diam: 3.50 cm Ao Asc diam:  3.30 cm  SHUNTS Systemic VTI:  0.13 m Systemic Diam: 2.47 cm Cherlynn Kaiser MD Electronically signed by Cherlynn Kaiser MD Signature Date/Time: 12/25/2019/1:27:41 PM    Final      ASSESSMENT/PLAN:  This is a very pleasant 59 year old African-American male recently diagnosed with extensive stage small cell lung cancer.  He presented with a spiculated right middle lobe pulmonary nodule and mildly enlarged solitary aortopulmonary lymph node.  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  He was diagnosed in March 2021.  He completed whole brain radiation under the care of Dr. Lisbeth Renshaw on 01/17/2020.  Dr. Julien Nordmann had a lengthly discussion with the patient today about his current condition and treatment options. Dr. Julien Nordmann recommends treatment with systemic chemotherapy with carboplatin for an AUC of 5 on day 1, etoposide 100 mg/m2 on days 1, 2, and 3, and imfinzi 1500 mg IV every 3 weeks with neulasta support. The patient is interested in proceeding with systemic chemotherapy.  She is expected to start her first dose of this treatment on 01/31/20.  We discussed the adverse side effects of treatment including but not limited to alopecia, myelosuppression, nausea and vomiting, liver or renal dysfunction as well as immunotherapy mediated adverse effects.   I will arrange for the patient to have a chemoeducation class prior to receiving her first cycle of chemotherapy.     I sent a prescription Compazine 10 mg every 6 hours as needed for nausea.   The patient will follow-up in 2.5 weeks for a one-week follow-up visit after completing his first cycle of  chemotherapy.  Discussed to start tapering his decadron to 1 tablet daily for 1 week and then discontinue. Discussed from our standpoint with his cancer treatment, it is ok for him to stay on Coumadin unless needed to be be discontinued for some other reason. He is supposed to have his INR checked at the coumadin clinic next week.   His blood sugar was high today at 356. He left the clinic before his results were available. Spoke to the patient on the phone to monitor his blood sugar closely at home using his glucometer and to take his medications as prescribed. Advised to monitor for symptoms of hyperglycemia.   The patient was advised to call immediately if he has any concerning symptoms in the interval. The patient voices understanding of current disease status and treatment options and is in agreement with the current care plan. All questions were answered. The patient knows to call the clinic with any problems, questions or concerns. We can certainly see the patient much sooner if necessary  No orders of the defined types were placed in this encounter.    Dyamond Tolosa L Sahand Gosch, PA-C 01/19/20   ADDENDUM: Hematology/Oncology Attending: I had a face-to-face encounter with the patient today.  I recommended his care plan.  This is a very pleasant 59 years old African-American male recently diagnosed with extensive stage small cell lung cancer with brain metastasis.  The patient underwent whole brain irradiation under the care of Dr. Lisbeth Renshaw.  He had a recent PET scan performed that showed no evidence of metastatic disease outside the lung except for the brain lesion. I personally and independently reviewed the scan images and discussed the results with the patient today. I had a lengthy discussion with the patient today about his current condition and treatment options.  He was given the option of palliative care  versus palliative systemic chemotherapy with carboplatin for AUC of 5 on day 1,  etoposide 100 mg/M2 on days 1, 2 and 3 with Neulasta support in addition to Imfinzi every 3 weeks for the 1st 4 cycles followed by maintenance Imfinzi if he has no evidence for disease progression after cycle #4. I discussed with the patient the adverse effect of this treatment including but not limited to alopecia, myelosuppression, nausea and vomiting, peripheral neuropathy, liver or renal dysfunction in addition to the immunotherapy adverse effects. The patient is expected to start the 1st cycle of this treatment on January 31, 2020. He will have a chemotherapy education class before the 1st dose of his treatment. Regarding the Decadron he will start tapering his Decadron dose down to 4 mg p.o. daily for 1 week before discontinuing. He will come back for follow-up visit 1 week after the 1st cycle of his treatment. The patient was advised to call immediately if he has any concerning symptoms in the interval.  Disclaimer: This note was dictated with voice recognition software. Similar sounding words can inadvertently be transcribed and may be missed upon review. Eilleen Kempf, MD 01/19/20

## 2020-01-19 ENCOUNTER — Inpatient Hospital Stay: Payer: Medicare Other

## 2020-01-19 ENCOUNTER — Other Ambulatory Visit: Payer: Self-pay

## 2020-01-19 ENCOUNTER — Inpatient Hospital Stay: Payer: Medicare Other | Attending: Radiation Oncology | Admitting: Physician Assistant

## 2020-01-19 ENCOUNTER — Encounter: Payer: Self-pay | Admitting: Physician Assistant

## 2020-01-19 ENCOUNTER — Telehealth (HOSPITAL_COMMUNITY): Payer: Self-pay | Admitting: *Deleted

## 2020-01-19 ENCOUNTER — Other Ambulatory Visit: Payer: Self-pay | Admitting: Internal Medicine

## 2020-01-19 VITALS — BP 84/68 | HR 94 | Temp 99.1°F | Resp 20 | Ht 72.0 in | Wt 155.5 lb

## 2020-01-19 DIAGNOSIS — C3411 Malignant neoplasm of upper lobe, right bronchus or lung: Secondary | ICD-10-CM

## 2020-01-19 DIAGNOSIS — Z794 Long term (current) use of insulin: Secondary | ICD-10-CM | POA: Insufficient documentation

## 2020-01-19 DIAGNOSIS — C7931 Secondary malignant neoplasm of brain: Secondary | ICD-10-CM | POA: Insufficient documentation

## 2020-01-19 DIAGNOSIS — Z5189 Encounter for other specified aftercare: Secondary | ICD-10-CM | POA: Diagnosis not present

## 2020-01-19 DIAGNOSIS — C342 Malignant neoplasm of middle lobe, bronchus or lung: Secondary | ICD-10-CM | POA: Diagnosis present

## 2020-01-19 DIAGNOSIS — C3490 Malignant neoplasm of unspecified part of unspecified bronchus or lung: Secondary | ICD-10-CM | POA: Diagnosis not present

## 2020-01-19 DIAGNOSIS — Z5111 Encounter for antineoplastic chemotherapy: Secondary | ICD-10-CM | POA: Diagnosis present

## 2020-01-19 DIAGNOSIS — Z79899 Other long term (current) drug therapy: Secondary | ICD-10-CM | POA: Diagnosis not present

## 2020-01-19 DIAGNOSIS — E119 Type 2 diabetes mellitus without complications: Secondary | ICD-10-CM | POA: Diagnosis not present

## 2020-01-19 DIAGNOSIS — Z5112 Encounter for antineoplastic immunotherapy: Secondary | ICD-10-CM

## 2020-01-19 DIAGNOSIS — I10 Essential (primary) hypertension: Secondary | ICD-10-CM | POA: Diagnosis not present

## 2020-01-19 DIAGNOSIS — Z7189 Other specified counseling: Secondary | ICD-10-CM

## 2020-01-19 LAB — CMP (CANCER CENTER ONLY)
ALT: 25 U/L (ref 0–44)
AST: 11 U/L — ABNORMAL LOW (ref 15–41)
Albumin: 3 g/dL — ABNORMAL LOW (ref 3.5–5.0)
Alkaline Phosphatase: 71 U/L (ref 38–126)
Anion gap: 5 (ref 5–15)
BUN: 45 mg/dL — ABNORMAL HIGH (ref 6–20)
CO2: 23 mmol/L (ref 22–32)
Calcium: 8.9 mg/dL (ref 8.9–10.3)
Chloride: 106 mmol/L (ref 98–111)
Creatinine: 1.68 mg/dL — ABNORMAL HIGH (ref 0.61–1.24)
GFR, Est AFR Am: 51 mL/min — ABNORMAL LOW (ref >60)
GFR, Estimated: 44 mL/min — ABNORMAL LOW (ref >60)
Glucose, Bld: 356 mg/dL — ABNORMAL HIGH (ref 70–99)
Potassium: 4.9 mmol/L (ref 3.5–5.1)
Sodium: 134 mmol/L — ABNORMAL LOW (ref 135–145)
Total Bilirubin: 0.4 mg/dL (ref 0.3–1.2)
Total Protein: 5.9 g/dL — ABNORMAL LOW (ref 6.5–8.1)

## 2020-01-19 LAB — CBC WITH DIFFERENTIAL (CANCER CENTER ONLY)
Abs Immature Granulocytes: 0.09 10*3/uL — ABNORMAL HIGH (ref 0.00–0.07)
Basophils Absolute: 0 10*3/uL (ref 0.0–0.1)
Basophils Relative: 0 %
Eosinophils Absolute: 0.1 10*3/uL (ref 0.0–0.5)
Eosinophils Relative: 1 %
HCT: 37.6 % — ABNORMAL LOW (ref 39.0–52.0)
Hemoglobin: 11.8 g/dL — ABNORMAL LOW (ref 13.0–17.0)
Immature Granulocytes: 1 %
Lymphocytes Relative: 10 %
Lymphs Abs: 1.3 10*3/uL (ref 0.7–4.0)
MCH: 30 pg (ref 26.0–34.0)
MCHC: 31.4 g/dL (ref 30.0–36.0)
MCV: 95.7 fL (ref 80.0–100.0)
Monocytes Absolute: 0.8 10*3/uL (ref 0.1–1.0)
Monocytes Relative: 6 %
Neutro Abs: 10.3 10*3/uL — ABNORMAL HIGH (ref 1.7–7.7)
Neutrophils Relative %: 82 %
Platelet Count: 183 10*3/uL (ref 150–400)
RBC: 3.93 MIL/uL — ABNORMAL LOW (ref 4.22–5.81)
RDW: 13.7 % (ref 11.5–15.5)
WBC Count: 12.7 10*3/uL — ABNORMAL HIGH (ref 4.0–10.5)
nRBC: 0 % (ref 0.0–0.2)

## 2020-01-19 MED ORDER — PROCHLORPERAZINE MALEATE 10 MG PO TABS: 10.0000 mg | ORAL_TABLET | Freq: Four times a day (QID) | ORAL | 2 refills | Status: DC | PRN

## 2020-01-19 NOTE — Telephone Encounter (Signed)
Spoke with patient he is aware and will share with his oncologist.

## 2020-01-19 NOTE — Progress Notes (Signed)
START ON PATHWAY REGIMEN - Small Cell Lung     Cycles 1 through 4: A cycle is every 21 days:     Durvalumab      Carboplatin      Etoposide    Cycles 5 and beyond: A cycle is every 28 days:     Durvalumab   **Always confirm dose/schedule in your pharmacy ordering system**  Patient Characteristics: Newly Diagnosed, Preoperative or Nonsurgical Candidate (Clinical Staging), First Line, Extensive Stage Therapeutic Status: Newly Diagnosed, Preoperative or Nonsurgical Candidate (Clinical Staging) AJCC T Category: cT1b AJCC N Category: cN2 AJCC M Category: cM1c AJCC 8 Stage Grouping: IVB Stage Classification: Extensive Intent of Therapy: Non-Curative / Palliative Intent, Discussed with Patient

## 2020-01-19 NOTE — Telephone Encounter (Signed)
pts oncologist worried about pts bp being too low. Systolic bp normally [id number] diastolic bp 64'Q-[id number]'K. Pt will start chemo which will lower bp as well. Pt said bp has been lower since taking entresto patient currently taking 49-51mg  bid. Pt wants to know if he should reduce or stop entresto.  Routed to [place] for advice

## 2020-01-19 NOTE — Patient Instructions (Addendum)
-There are two main categories of lung cancer, they are named based on the size of the cancer cell. One is called Non-Small cell lung cancer. The other type is Small Cell Lung Cancer -The sample (biopsy) that they took of your tumor was consistent with Small Cell Lung Cancer -We covered a lot of important information at your appointment today regarding what the treatment plan is moving forward. Here are the the main points that were discussed at your office visit with [place] today:  -The treatment that you will receive consists of Two chemotherapy drugs, called Carboplatin and Etoposide and One Immunotherapy drug called Imfinzi (Durvalumab)  -We are planning on starting your treatment next week on 01/31/20 but before your start your treatment, I would like you to attend a Chemotherapy Education Class. This involves having you sit down with one of our nurse educators. She will discuss with your one-on-one more details about your treatment as well as general information about resources here at the [facility] treatment will be given for three consecutive days every 3 weeks. You would need to come back for an injection 2 days after your 3rd day of treatment. The purpose of this injection is to boost up your infection fighting cells so that they do not drop too low as a side effect of chemotherapy. We will check your labs once a week for the first ~4 treatments just to make sure that important components of your blood are in an acceptable range -We will get a CT scan after 2 treatments to check on the progress of treatment  Medications:  -I have sent a few important medication prescriptions to your pharmacy.  -Compazine was sent to your pharmacy. This medication is for nausea. You may take this every 6 hours as needed if you feel nausous. .   Side Effects:  -The adverse effect of this treatment including but not limited to alopecia (losing your hair), fatigue, myelosuppression (drops in the blood  counts), nausea and vomiting, liver or renal dysfunction.   Follow up:  -We will see you back for a follow up visit in ~4/27 for a one week follow up visit.    Durvalumab injection What is this medicine? DURVALUMAB (dur VAL ue mab) is a monoclonal antibody. It is used to treat urothelial cancer and lung cancer. This medicine may be used for other purposes; ask your health care provider or pharmacist if you have questions. COMMON BRAND NAME(S): IMFINZI What should I tell my health care provider before I take this medicine? They need to know if you have any of these conditions:  diabetes  immune system problems  infection  inflammatory bowel disease  kidney disease  liver disease  lung or breathing disease  lupus  organ transplant  stomach or intestine problems  thyroid disease  an unusual or allergic reaction to durvalumab, other medicines, foods, dyes, or preservatives  pregnant or trying to get pregnant  breast-feeding How should I use this medicine? This medicine is for infusion into a vein. It is given by a health care professional in a hospital or clinic setting. A special MedGuide will be given to you before each treatment. Be sure to read this information carefully each time. Talk to your pediatrician regarding the use of this medicine in children. Special care may be needed. Overdosage: If you think you have taken too much of this medicine contact a poison control center or emergency room at once. NOTE: This medicine is only for you. Do  not share this medicine with others. What if I miss a dose? It is important not to miss your dose. Call your doctor or health care professional if you are unable to keep an appointment. What may interact with this medicine? Interactions have not been studied. This list may not describe all possible interactions. Give your health care provider a list of all the medicines, herbs, non-prescription drugs, or dietary supplements you  use. Also tell them if you smoke, drink alcohol, or use illegal drugs. Some items may interact with your medicine. What should I watch for while using this medicine? This drug may make you feel generally unwell. Continue your course of treatment even though you feel ill unless your doctor tells you to stop. You may need blood work done while you are taking this medicine. Do not become pregnant while taking this medicine or for 3 months after stopping it. Women should inform their doctor if they wish to become pregnant or think they might be pregnant. There is a potential for serious side effects to an unborn child. Talk to your health care professional or pharmacist for more information. Do not breast-feed an infant while taking this medicine or for 3 months after stopping it. What side effects may I notice from receiving this medicine? Side effects that you should report to your doctor or health care professional as soon as possible:  allergic reactions like skin rash, itching or hives, swelling of the face, lips, or tongue  black, tarry stools  bloody or watery diarrhea  breathing problems  change in emotions or moods  change in sex drive  changes in vision  chest pain or chest tightness  chills  confusion  cough  facial flushing  fever  headache  signs and symptoms of high blood sugar such as dizziness; dry mouth; dry skin; fruity breath; nausea; stomach pain; increased hunger or thirst; increased urination  signs and symptoms of liver injury like dark yellow or brown urine; general ill feeling or flu-like symptoms; light-colored stools; loss of appetite; nausea; right upper belly pain; unusually weak or tired; yellowing of the eyes or skin  stomach pain  trouble passing urine or change in the amount of urine  weight gain or weight loss Side effects that usually do not require medical attention (report these to your doctor or health care professional if they continue  or are bothersome):  bone pain  constipation  loss of appetite  muscle pain  nausea  swelling of the ankles, feet, hands  tiredness This list may not describe all possible side effects. Call your doctor for medical advice about side effects. You may report side effects to FDA at 1-800-FDA-1088. Where should I keep my medicine? This drug is given in a hospital or clinic and will not be stored at home. NOTE: This sheet is a summary. It may not cover all possible information. If you have questions about this medicine, talk to your doctor, pharmacist, or health care provider.  2020 Elsevier/Gold Standard (2016-12-09 19:25:04) Etoposide, VP-16 capsules What is this medicine? ETOPOSIDE, VP-16 (e toe POE side) is a chemotherapy drug. It is used to treat small cell lung cancer and other cancers. This medicine may be used for other purposes; ask your health care provider or pharmacist if you have questions. COMMON BRAND NAME(S): VePesid What should I tell my health care provider before I take this medicine? They need to know if you have any of these conditions:  infection  kidney disease  liver disease  low blood counts, like low white cell, platelet, or red cell counts  an unusual or allergic reaction to etoposide, other medicines, foods, dyes, or preservatives  pregnant or trying to get pregnant  breast-feeding How should I use this medicine? Take this medicine by mouth with a glass of water. Follow the directions on the prescription label. Do not open, crush, or chew the capsules. It is advisable to wear gloves when handling this medicine. Take your medicine at regular intervals. Do not take it more often than directed. Do not stop taking except on your doctor's advice. Talk to your pediatrician regarding the use of this medicine in children. Special care may be needed. Overdosage: If you think you have taken too much of this medicine contact a poison control center or  emergency room at once. NOTE: This medicine is only for you. Do not share this medicine with others. What if I miss a dose? If you miss a dose, take it as soon as you can. If it is almost time for your next dose, take only that dose. Do not take double or extra doses. What may interact with this medicine? This medicine may interact with the following medications:  cyclosporine  warfarin This list may not describe all possible interactions. Give your health care provider a list of all the medicines, herbs, non-prescription drugs, or dietary supplements you use. Also tell them if you smoke, drink alcohol, or use illegal drugs. Some items may interact with your medicine. What should I watch for while using this medicine? Visit your doctor for checks on your progress. This drug may make you feel generally unwell. This is not uncommon, as chemotherapy can affect healthy cells as well as cancer cells. Report any side effects. Continue your course of treatment even though you feel ill unless your doctor tells you to stop. In some cases, you may be given additional medicines to help with side effects. Follow all directions for their use. Call your doctor or health care professional for advice if you get a fever, chills or sore throat, or other symptoms of a cold or flu. Do not treat yourself. This drug decreases your body's ability to fight infections. Try to avoid being around people who are sick. This medicine may increase your risk to bruise or bleed. Call your doctor or health care professional if you notice any unusual bleeding. Talk to your doctor about your risk of cancer. You may be more at risk for certain types of cancers if you take this medicine. Do not become pregnant while taking this medicine or for at least 6 months after stopping it. Women should inform their doctor if they wish to become pregnant or think they might be pregnant. Women of child-bearing potential will need to have a negative  pregnancy test before starting this medicine. There is a potential for serious side effects to an unborn child. Talk to your health care professional or pharmacist for more information. Do not breast-feed an infant while taking this medicine. Men must use a latex condom during sexual contact with a woman while taking this medicine and for at least 4 months after stopping it. A latex condom is needed even if you have had a vasectomy. Contact your doctor right away if your partner becomes pregnant. Do not donate sperm while taking this medicine and for 4 months after you stop taking this medicine. Men should inform their doctors if they wish to father a child. This medicine may lower sperm counts. What side  effects may I notice from receiving this medicine? Side effects that you should report to your doctor or health care professional as soon as possible:  allergic reactions like skin rash, itching or hives, swelling of the face, lips, or tongue  low blood counts - this medicine may decrease the number of white blood cells, red blood cells, and platelets. You may be at increased risk for infections and bleeding  nausea, vomiting  redness, blistering, peeling or loosening of the skin, including inside the mouth  signs and symptoms of infection like fever; chills; cough; sore throat; pain or trouble passing urine  signs and symptoms of low red blood cells or anemia such as unusually weak or tired; feeling faint or lightheaded; falls; breathing problems  unusual bruising or bleeding Side effects that usually do not require medical attention (report to your doctor or health care professional if they continue or are bothersome):  changes in taste  diarrhea  hair loss  loss of appetite  mouth sores This list may not describe all possible side effects. Call your doctor for medical advice about side effects. You may report side effects to FDA at 1-800-FDA-1088. Where should I keep my  medicine? Keep out of the reach of children. Store in a refrigerator between 2 and 8 degrees C (36 and 46 degrees F). Do not freeze. Throw away any unused medicine after the expiration date. NOTE: This sheet is a summary. It may not cover all possible information. If you have questions about this medicine, talk to your doctor, pharmacist, or health care provider.  2020 Elsevier/Gold Standard (2018-11-24 16:44:55) Carboplatin injection What is this medicine? CARBOPLATIN (KAR boe pla tin) is a chemotherapy drug. It targets fast dividing cells, like cancer cells, and causes these cells to die. This medicine is used to treat ovarian cancer and many other cancers. This medicine may be used for other purposes; ask your health care provider or pharmacist if you have questions. COMMON BRAND NAME(S): Paraplatin What should I tell my health care provider before I take this medicine? They need to know if you have any of these conditions:  blood disorders  hearing problems  kidney disease  recent or ongoing radiation therapy  an unusual or allergic reaction to carboplatin, cisplatin, other chemotherapy, other medicines, foods, dyes, or preservatives  pregnant or trying to get pregnant  breast-feeding How should I use this medicine? This drug is usually given as an infusion into a vein. It is administered in a hospital or clinic by a specially trained health care professional. Talk to your pediatrician regarding the use of this medicine in children. Special care may be needed. Overdosage: If you think you have taken too much of this medicine contact a poison control center or emergency room at once. NOTE: This medicine is only for you. Do not share this medicine with others. What if I miss a dose? It is important not to miss a dose. Call your doctor or health care professional if you are unable to keep an appointment. What may interact with this medicine?  medicines for seizures  medicines  to increase blood counts like filgrastim, pegfilgrastim, sargramostim  some antibiotics like amikacin, gentamicin, neomycin, streptomycin, tobramycin  vaccines Talk to your doctor or health care professional before taking any of these medicines:  acetaminophen  aspirin  ibuprofen  ketoprofen  naproxen This list may not describe all possible interactions. Give your health care provider a list of all the medicines, herbs, non-prescription drugs, or dietary supplements you  use. Also tell them if you smoke, drink alcohol, or use illegal drugs. Some items may interact with your medicine. What should I watch for while using this medicine? Your condition will be monitored carefully while you are receiving this medicine. You will need important blood work done while you are taking this medicine. This drug may make you feel generally unwell. This is not uncommon, as chemotherapy can affect healthy cells as well as cancer cells. Report any side effects. Continue your course of treatment even though you feel ill unless your doctor tells you to stop. In some cases, you may be given additional medicines to help with side effects. Follow all directions for their use. Call your doctor or health care professional for advice if you get a fever, chills or sore throat, or other symptoms of a cold or flu. Do not treat yourself. This drug decreases your body's ability to fight infections. Try to avoid being around people who are sick. This medicine may increase your risk to bruise or bleed. Call your doctor or health care professional if you notice any unusual bleeding. Be careful brushing and flossing your teeth or using a toothpick because you may get an infection or bleed more easily. If you have any dental work done, tell your dentist you are receiving this medicine. Avoid taking products that contain aspirin, acetaminophen, ibuprofen, naproxen, or ketoprofen unless instructed by your doctor. These medicines  may hide a fever. Do not become pregnant while taking this medicine. Women should inform their doctor if they wish to become pregnant or think they might be pregnant. There is a potential for serious side effects to an unborn child. Talk to your health care professional or pharmacist for more information. Do not breast-feed an infant while taking this medicine. What side effects may I notice from receiving this medicine? Side effects that you should report to your doctor or health care professional as soon as possible:  allergic reactions like skin rash, itching or hives, swelling of the face, lips, or tongue  signs of infection - fever or chills, cough, sore throat, pain or difficulty passing urine  signs of decreased platelets or bleeding - bruising, pinpoint red spots on the skin, black, tarry stools, nosebleeds  signs of decreased red blood cells - unusually weak or tired, fainting spells, lightheadedness  breathing problems  changes in hearing  changes in vision  chest pain  high blood pressure  low blood counts - This drug may decrease the number of white blood cells, red blood cells and platelets. You may be at increased risk for infections and bleeding.  nausea and vomiting  pain, swelling, redness or irritation at the injection site  pain, tingling, numbness in the hands or feet  problems with balance, talking, walking  trouble passing urine or change in the amount of urine Side effects that usually do not require medical attention (report to your doctor or health care professional if they continue or are bothersome):  hair loss  loss of appetite  metallic taste in the mouth or changes in taste This list may not describe all possible side effects. Call your doctor for medical advice about side effects. You may report side effects to FDA at 1-800-FDA-1088. Where should I keep my medicine? This drug is given in a hospital or clinic and will not be stored at  home. NOTE: This sheet is a summary. It may not cover all possible information. If you have questions about this medicine, talk to your doctor,  pharmacist, or health care provider.  2020 Elsevier/Gold Standard (2008-01-04 14:38:05)

## 2020-01-19 NOTE — Telephone Encounter (Signed)
Lets reduce to 24/26 and see if that gets BP up a bit.

## 2020-01-20 ENCOUNTER — Other Ambulatory Visit: Payer: Self-pay | Admitting: Cardiovascular Disease

## 2020-01-20 NOTE — Telephone Encounter (Signed)
Called pt no answer/left vm requesting pt return my call

## 2020-01-23 ENCOUNTER — Telehealth: Payer: Self-pay | Admitting: Physician Assistant

## 2020-01-23 NOTE — Telephone Encounter (Signed)
2nd attempt no answer/left vm requesting pt return my call

## 2020-01-23 NOTE — Telephone Encounter (Signed)
Scheduled per los. Called and left msg.

## 2020-01-24 MED ORDER — ENTRESTO 24-26 MG PO TABS: 1.0000 | ORAL_TABLET | Freq: Two times a day (BID) | ORAL | 3 refills | Status: DC

## 2020-01-24 NOTE — Telephone Encounter (Signed)
Spoke with patient he is aware and agreeable with plan. Pt also informed me his oncologist decided to keep him on coumadin.

## 2020-01-25 NOTE — Progress Notes (Signed)
Pharmacist Chemotherapy Monitoring - Initial Assessment    Anticipated start date: 01/31/20  Regimen:  . Are orders appropriate based on the patient's diagnosis, regimen, and cycle? Yes . Does the plan date match the patient's scheduled date? Yes . Is the sequencing of drugs appropriate? Yes . Are the premedications appropriate for the patient's regimen? Yes . Prior Authorization for treatment is: Pending o If applicable, is the correct biosimilar selected based on the patient's insurance? not applicable  Organ Function and Labs: Marland Kitchen Are dose adjustments needed based on the patient's renal function, hepatic function, or hematologic function? Yes - Carbo; watch SCr. . Are appropriate labs ordered prior to the start of patient's treatment? Yes . Other organ system assessment, if indicated: N/A . The following baseline labs, if indicated, have been ordered: durvalumab: baseline TSH +/- T4  Dose Assessment: . Are the drug doses appropriate? Yes . Are the following correct: o Drug concentrations Yes o IV fluid compatible with drug Yes o Administration routes Yes o Timing of therapy Yes . If applicable, does the patient have documented access for treatment and/or plans for port-a-cath placement? no . If applicable, have lifetime cumulative doses been properly documented and assessed? not applicable Lifetime Dose Tracking  No doses have been documented on this patient for the following tracked chemicals: Doxorubicin, Epirubicin, Idarubicin, Daunorubicin, Mitoxantrone, Bleomycin, Oxaliplatin, Carboplatin, Liposomal Doxorubicin  o   Toxicity Monitoring/Prevention: . The patient has the following take home antiemetics prescribed: Prochlorperazine . The patient has the following take home medications prescribed: N/A . Medication allergies and previous infusion related reactions, if applicable, have been reviewed and addressed. Yes . The patient's current medication list has been assessed for  drug-drug interactions with their chemotherapy regimen. no significant drug-drug interactions were identified on review. *Pt currently tapering Dex per 01/19/20 OV note.*  Order Review: . Are the treatment plan orders signed? Yes . Is the patient scheduled to see a provider prior to their treatment? Yes  I verify that I have reviewed each item in the above checklist and answered each question accordingly.   Kennith Center, Pharm.D., CPP 01/25/2020@4 :34 PM

## 2020-01-27 ENCOUNTER — Other Ambulatory Visit: Payer: Self-pay

## 2020-01-27 ENCOUNTER — Encounter: Payer: Self-pay | Admitting: Internal Medicine

## 2020-01-27 ENCOUNTER — Inpatient Hospital Stay: Payer: Medicare Other

## 2020-01-27 NOTE — Progress Notes (Signed)
Called pt to introduce myself as his Arboriculturist, discuss copay assistance and the [employer].  I left a msg requesting he return my call if he's interested in applying for the grants.

## 2020-01-28 NOTE — Progress Notes (Signed)
[facility] OFFICE PROGRESS NOTE  System, Pcp Not In No address on file  DIAGNOSIS: Extensive stage small cell lung cancer. He presented with a spiculated right middle lobe pulmonary nodule and mildly enlargedsolitary aortopulmonary lymph node. 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 He was diagnosed in March 2021.  PRIOR THERAPY: 1) Whole brain radiation under the care of Dr. Lisbeth Renshaw. Completed 01/17/2020  CURRENT THERAPY: Systemic chemotherapy with carboplatin for an AUC of 5 on day 1,etoposide 100 mg/m on days 1, 2, and 3, and Imfinzi 1500 mg on day 1 IV every 3 weeks. First dose expected on 01/31/20.   INTERVAL HISTORY: Dennis [name]. 59 y.o. male returns to the clinic for a follow up visit accompanied by his friend. The patient is feeling well today without any concerning complaints.  He was diagnosed with extensive stage small cell lung cancer in March 2021. He was found to have a solitary brain metastasis and completed whole brain radiation under the care of Dr. Lisbeth Renshaw on 01/17/2020. He is supposed to have completed his decadron taper but he had extra pills so continued to take them. He is here to start his first cycle of palliative systemic chemotherapy. Denies any fever, chills, night sweats, or weight loss. Denies any chest pain, shortness of breath, cough, or hemoptysis. Denies any nausea, vomiting, diarrhea, or constipation. Denies any headache or visual changes. Denies any rashes or skin changes. He follows with his PCP for his diabetes and his cardiologist for his CHF. His PCP recently changed his diabetes medications and increased his dose. The patient is here today for evaluation prior to starting cycle # 1   MEDICAL HISTORY: Past Medical History:  Diagnosis Date  . Alcohol abuse with alcohol-induced mood disorder ([place]) 08/18/2015  . Automatic implantable cardioverter-defibrillator in situ 2012   S/P St Jude Dual  Chamber. ICD.  Marland Kitchen CAD (coronary artery disease) 11/16/2014   CAD Coronary angiography (12/2008) with mid LAD totally occluded and collateralized.  . Cardiac LV ejection fraction 10-20%   . Chest pain 09/04/2018  . CHF (congestive heart failure) ([place])   . Chronic systolic heart failure (HCC)    a) Mixed ICM/NICM b) RHC (05/2014): RA 2, RV 19/2/3, PA 22/14 (18), PCWP 6, Fick CO/CI: 5.2 / 2.7, PVR 2.3 WU, PA 60% and 64% c) ECHO (05/2014): EF 20-25%, diff HK, akinesis entireanteroseptal myocardium, triv AI, mod MR, LA mod/sev dilated  . Cocaine abuse ([place]) 01/24/2015  . Cocaine abuse with cocaine-induced mood disorder ([place]) 08/20/2015  . Drug abuse and dependence ([place])   . Dysphagia following cerebral infarction 01/24/2015  . Embolic stroke involving right middle cerebral artery ([place])   . Extensive stage primary small cell carcinoma of lung ([place]) 01/04/2020  . H/O noncompliance with medical treatment, presenting hazards to health 01/24/2015  . Heart murmur   . High cholesterol   . Ischemic cardiomyopathy    a) Coronary angiography (12/2008) at [facility]: Lmain: nl, LAD mid 100% stenosis with left to left and right-to-left collaterals to the distal LAD; Lcx: nl, RCA nl.    . 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[place])   . MDD (major depressive disorder), recurrent severe, without psychosis ([place]) 08/18/2015  . Open wound of foot 02/27/2015  . Pneumonia 1988  . Psychoactive substance-induced mood disorder ([place]) 07/17/2015  . Sleep apnea    "cleared after T&A"  . Status post PICC central line placement   . Stroke ([place])   .  Substance induced mood disorder ([place]) 08/17/2015  . Type II diabetes mellitus (HCC)     ALLERGIES:  has No Known Allergies.  MEDICATIONS:  Current Outpatient Medications  Medication Sig Dispense Refill  . atorvastatin (LIPITOR) 20 MG tablet TAKE 1 TABLET BY MOUTH DAILY AT 6 PM. (Patient taking differently: Take 20 mg by mouth daily at 6 PM. ) 30 tablet 6  . carvedilol (COREG) 6.25 MG tablet  Take 1.5 tablets (9.375 mg total) by mouth 2 (two) times daily with a meal. 90 tablet 3  . dexamethasone (DECADRON) 4 MG tablet Take 1 tablet (4 mg total) by mouth 2 (two) times daily. Please also instruct pt on taking omeprazole once daily while taking steroids 60 tablet 0  . empagliflozin (JARDIANCE) 10 MG TABS tablet Take 10 mg by mouth daily before breakfast. 30 tablet 6  . Insulin Glargine (LANTUS SOLOSTAR) 100 UNIT/ML Solostar Pen Inject 36 Units into the skin daily at 10 pm.     . memantine (NAMENDA) 5 MG tablet Start on 1st day of radiation. Week 1: take one po daily. Week 2: take one po twice daily. Week 3: take two po qam, and one po q pm. Week 4: take 2 po twice daily for remaining 20 weeks. 630 tablet 0  . metFORMIN (GLUCOPHAGE) 500 MG tablet Take 500 mg by mouth 2 (two) times daily with a meal.    . prochlorperazine (COMPAZINE) 10 MG tablet Take 1 tablet (10 mg total) by mouth every 6 (six) hours as needed. 30 tablet 2  . sacubitril-valsartan (ENTRESTO) 24-26 MG Take 1 tablet by mouth 2 (two) times daily. 60 tablet 3  . spironolactone (ALDACTONE) 25 MG tablet Take 1 tablet (25 mg total) by mouth daily. 90 tablet 3  . warfarin (COUMADIN) 7.5 MG tablet TAKE 1 TABLET BY MOUTH DAILY AT 6PM. 30 tablet 0   No current facility-administered medications for this visit.   Facility-Administered Medications Ordered in Other Visits  Medication Dose Route Frequency Provider Last Rate Last Admin  . CARBOplatin (PARAPLATIN) 370 mg in sodium chloride 0.9 % 250 mL chemo infusion  370 mg Intravenous Once Curt Bears, MD      . durvalumab [facility]) 1,500 mg in sodium chloride 0.9 % 100 mL chemo infusion  1,500 mg Intravenous Once Curt Bears, MD 130 mL/hr at 01/31/20 1236 1,500 mg at 01/31/20 1236  . etoposide (VEPESID) 190 mg in sodium chloride 0.9 % 500 mL chemo infusion  100 mg/m2 (Treatment Plan Recorded) Intravenous Once Curt Bears, MD        SURGICAL HISTORY:  Past Surgical  History:  Procedure Laterality Date  . BRONCHIAL BRUSHINGS  12/28/2019   Procedure: BRONCHIAL BRUSHINGS;  Surgeon: Garner Nash, DO;  Location: [facility];  Service: Thoracic;;  . BRONCHIAL NEEDLE ASPIRATION BIOPSY  12/28/2019   Procedure: BRONCHIAL NEEDLE ASPIRATION BIOPSIES;  Surgeon: Garner Nash, DO;  Location: [facility];  Service: Thoracic;;  . BRONCHIAL WASHINGS  12/28/2019   Procedure: BRONCHIAL WASHINGS;  Surgeon: Garner Nash, DO;  Location: [facility];  Service: Thoracic;;  . CARDIAC CATHETERIZATION  05/2014  . CARDIAC DEFIBRILLATOR PLACEMENT  03/2011  . CHOLECYSTECTOMY  1994  . FOREARM FRACTURE SURGERY Left 1980  . FRACTURE SURGERY    . RIGHT HEART CATHETERIZATION N/A 05/30/2014   Procedure: RIGHT HEART CATH;  Surgeon: Jolaine Artist, MD;  Location: [facility] CATH LAB;  Service: Cardiovascular;  Laterality: N/A;  . RIGHT HEART CATHETERIZATION N/A 02/07/2015   Procedure: RIGHT HEART CATH;  Surgeon: Jolaine Artist, MD;  Location: [facility] CATH LAB;  Service: Cardiovascular;  Laterality: N/A;  . TONSILLECTOMY AND ADENOIDECTOMY  ~ 1998  . VIDEO BRONCHOSCOPY WITH ENDOBRONCHIAL ULTRASOUND N/A 12/28/2019   Procedure: VIDEO BRONCHOSCOPY WITH ENDOBRONCHIAL ULTRASOUND;  Surgeon: Garner Nash, DO;  Location: [facility] ENDOS[facility];  Service: Thoracic;  Laterality: N/A;    REVIEW OF SYSTEMS:   Review of Systems  Constitutional: Negative for appetite change, chills, fatigue, fever and unexpected weight change.  HENT:   Negative for mouth sores, nosebleeds, sore throat and trouble swallowing.   Eyes: Negative for eye problems and icterus.  Respiratory: Positive for mild dyspnea on exertion. Negative for cough, hemoptysis, and wheezing.  Cardiovascular: Negative for chest pain and leg swelling.  Gastrointestinal: Negative for abdominal pain, constipation, diarrhea, nausea and vomiting.  Genitourinary: Negative for bladder incontinence, difficulty urinating, dysuria, frequency and hematuria.    Musculoskeletal: Negative for back pain, gait problem, neck pain and neck stiffness.  Skin: Negative for itching and rash.  Neurological: Negative for dizziness, extremity weakness, gait problem, headaches, light-headedness and seizures.  Hematological: Negative for adenopathy. Does not bruise/bleed easily.  Psychiatric/Behavioral: Negative for confusion, depression and sleep disturbance. The patient is not nervous/anxious.     PHYSICAL EXAMINATION:  Blood pressure 102/65, pulse 93, temperature 98.2 F (36.8 C), temperature source Temporal, resp. rate 18, height 6' (1.829 m), weight 160 lb 6.4 oz (72.8 kg), SpO2 100 %.  ECOG PERFORMANCE STATUS: 1 - Symptomatic but completely ambulatory  Physical Exam  Constitutional: Oriented to person, place, and time and well-developed, well-nourished, and in no distress. HENT:  Head: Normocephalic and atraumatic.  Mouth/Throat: Oropharynx is clear and moist. No oropharyngeal exudate.  Eyes: Conjunctivae are normal. Right eye exhibits no discharge. Left eye exhibits no discharge. No scleral icterus.  Neck: Normal range of motion. Neck supple.  Cardiovascular: Normal rate, regular rhythm, normal heart sounds and intact distal pulses.   Pulmonary/Chest: Effort normal and breath sounds normal. No respiratory distress. No wheezes. No rales.  Abdominal: Soft. Bowel sounds are normal. Exhibits no distension and no mass. There is no tenderness.  Musculoskeletal: Normal range of motion. Exhibits no edema.  Lymphadenopathy:    No cervical adenopathy.  Neurological: Alert and oriented to person, place, and time. Exhibits normal muscle tone. Gait normal. Coordination normal.  Skin: Skin is warm and dry. No rash noted. Not diaphoretic. No erythema. No pallor.  Psychiatric: Mood, memory and judgment normal.  Vitals reviewed.  LABORATORY DATA: Lab Results  Component Value Date   WBC 14.6 (H) 01/31/2020   HGB 11.5 (L) 01/31/2020   HCT 37.0 (L) 01/31/2020    MCV 98.1 01/31/2020   PLT 194 01/31/2020      Chemistry      Component Value Date/Time   NA 135 01/31/2020 0952   K 5.1 01/31/2020 0952   CL 106 01/31/2020 0952   CO2 21 (L) 01/31/2020 0952   BUN 39 (H) 01/31/2020 0952   CREATININE 1.81 (H) 01/31/2020 0952      Component Value Date/Time   CALCIUM 8.9 01/31/2020 0952   ALKPHOS 77 01/31/2020 0952   AST 12 (L) 01/31/2020 0952   ALT 24 01/31/2020 0952   BILITOT 0.5 01/31/2020 0952       RADIOGRAPHIC STUDIES:  NM PET Image Initial (PI) Skull Base To Thigh  Result Date: 01/17/2020 CLINICAL DATA:  Initial treatment strategy for metastatic small cell lung cancer. EXAM: NUCLEAR MEDICINE PET SKULL BASE TO THIGH TECHNIQUE: 8.05 mCi F-18 FDG was  injected intravenously. Full-ring PET imaging was performed from the skull base to thigh after the radiotracer. CT data was obtained and used for attenuation correction and anatomic localization. Fasting blood glucose: 220 mg/dl COMPARISON:  Chest CT 12/23/2019 FINDINGS: Mediastinal blood pool activity: SUV max 1.77 Liver activity: SUV max NA NECK: No hypermetabolic lymph nodes in the neck. Incidental CT findings: none CHEST: The right middle lobe lesion is slightly smaller and less well-defined than on the prior CT scan. It measures a maximum of 9 mm. No hypermetabolism is demonstrated. SUV max is 0.6. Mild FDG uptake in the subcarinal node with SUV max of 3.27. No other areas of abnormal FDG uptake in the mediastinum or hilum. No supraclavicular or axillary adenopathy. Stable underlying emphysematous changes and pulmonary scarring. No other pulmonary nodules are identified. Stable left basilar scarring changes. Incidental CT findings: none ABDOMEN/PELVIS: No abnormal hypermetabolic activity within the liver, pancreas, adrenal glands, or spleen. No hypermetabolic lymph nodes in the abdomen or pelvis. Incidental CT findings: Benign 2.9 cm right adrenal gland adenoma. Status post cholecystectomy. Age advanced  atherosclerotic calcifications involving the aorta and iliac arteries. Right renal calculus. SKELETON: No focal hypermetabolic activity to suggest skeletal metastasis. Incidental CT findings: none IMPRESSION: 1. The small right middle lobe lesion is non hypermetabolic. The subcarinal lymph node is mildly hypermetabolic. No other sites of adenopathy or metastatic disease are identified. 2. Benign right adrenal gland adenoma. Electronically Signed   By: Marijo Sanes M.D.   On: 01/17/2020 10:32     ASSESSMENT/PLAN:  This is a very pleasant 59 year old African-American male recently diagnosed with extensive stage small cell lung cancer. He presented with a spiculated right middle lobe pulmonary nodule and mildly enlargedsolitary aortopulmonary lymph node. 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 He was diagnosed in March 2021.  He completed whole brain radiation under the care of Dr. Lisbeth Renshaw on 01/17/2020.  He is currently undergoing palliative systemic chemotherapy with carboplatin for an 5 on day 1, etoposide 100 mg/m2 on days 1, 2, and 3, and imfinzi 1500 mg IV every 3 weeks with neulasta support. He is scheduled to start his first cycle today.   Labs were reviewed. His labs were also reviewed with Dr. Julien Nordmann today who recommended that he continue with cycle #1 at the same dose. The patient was encouraged to stay hydrated for his creatinine and to monitor his blood sugar closely at home. He will be given 7 units of insulin while in the clinic today. His blood sugar was 212 upon recheck 1 hour later.   We will see him back for a follow up visit in 1 week for evaluation and to manage any adverse side effects of treatment.   Discussed that the patient has completed his 1 tablet of decadron per week and that he may discontinue his decadron.   Considering the patient's treatment is 3 consecutive days, discussed port-a-cath with the patient. He is no  interested in a port-a-cath insertion at this time.   The patient was advised to call immediately if he has any concerning symptoms in the interval. The patient voices understanding of current disease status and treatment options and is in agreement with the current care plan. All questions were answered. The patient knows to call the clinic with any problems, questions or concerns. We can certainly see the patient much sooner if necessary      No orders of the defined types were placed in this  encounter.    Miabella Shannahan L Mareo Portilla, PA-C 01/31/20

## 2020-01-30 ENCOUNTER — Ambulatory Visit: Payer: Medicare Other | Attending: Internal Medicine

## 2020-01-30 DIAGNOSIS — Z23 Encounter for immunization: Secondary | ICD-10-CM

## 2020-01-30 NOTE — Progress Notes (Signed)
   [id number]  Name:  Dennis [name].    MRN: [id number] DOB: 1961/07/22  01/30/2020  Dennis [name] was observed post Covid-19 immunization for 15 minutes without incident. He was provided with Vaccine Information Sheet and instruction to access the V-Safe system.   Dennis [name] was instructed to call 911 with any severe reactions post vaccine: Marland Kitchen Difficulty breathing  . Swelling of face and throat  . A fast heartbeat  . A bad rash all over body  . Dizziness and weakness   Immunizations Administered    Name Date Dose VIS Date Route   Pfizer COVID-19 Vaccine 01/30/2020  9:04 AM 0.3 mL 12/07/2018 Intramuscular   Manufacturer: [place]   Lot: [id number]   [place]: [id number]

## 2020-01-31 ENCOUNTER — Other Ambulatory Visit: Payer: Self-pay | Admitting: Physician Assistant

## 2020-01-31 ENCOUNTER — Inpatient Hospital Stay: Payer: Medicare Other

## 2020-01-31 ENCOUNTER — Encounter: Payer: Self-pay | Admitting: Internal Medicine

## 2020-01-31 ENCOUNTER — Other Ambulatory Visit: Payer: Self-pay

## 2020-01-31 ENCOUNTER — Inpatient Hospital Stay (HOSPITAL_BASED_OUTPATIENT_CLINIC_OR_DEPARTMENT_OTHER): Payer: Medicare Other | Admitting: Physician Assistant

## 2020-01-31 VITALS — BP 102/65 | HR 93 | Temp 98.2°F | Resp 18 | Ht 72.0 in | Wt 160.4 lb

## 2020-01-31 DIAGNOSIS — C3411 Malignant neoplasm of upper lobe, right bronchus or lung: Secondary | ICD-10-CM

## 2020-01-31 DIAGNOSIS — E1169 Type 2 diabetes mellitus with other specified complication: Secondary | ICD-10-CM

## 2020-01-31 DIAGNOSIS — E1165 Type 2 diabetes mellitus with hyperglycemia: Secondary | ICD-10-CM

## 2020-01-31 DIAGNOSIS — C3490 Malignant neoplasm of unspecified part of unspecified bronchus or lung: Secondary | ICD-10-CM

## 2020-01-31 DIAGNOSIS — Z5112 Encounter for antineoplastic immunotherapy: Secondary | ICD-10-CM

## 2020-01-31 DIAGNOSIS — Z5111 Encounter for antineoplastic chemotherapy: Secondary | ICD-10-CM

## 2020-01-31 DIAGNOSIS — E785 Hyperlipidemia, unspecified: Secondary | ICD-10-CM

## 2020-01-31 LAB — CBC WITH DIFFERENTIAL (CANCER CENTER ONLY)
Abs Immature Granulocytes: 0.22 10*3/uL — ABNORMAL HIGH (ref 0.00–0.07)
Basophils Absolute: 0 10*3/uL (ref 0.0–0.1)
Basophils Relative: 0 %
Eosinophils Absolute: 0.1 10*3/uL (ref 0.0–0.5)
Eosinophils Relative: 1 %
HCT: 37 % — ABNORMAL LOW (ref 39.0–52.0)
Hemoglobin: 11.5 g/dL — ABNORMAL LOW (ref 13.0–17.0)
Immature Granulocytes: 2 %
Lymphocytes Relative: 12 %
Lymphs Abs: 1.8 10*3/uL (ref 0.7–4.0)
MCH: 30.5 pg (ref 26.0–34.0)
MCHC: 31.1 g/dL (ref 30.0–36.0)
MCV: 98.1 fL (ref 80.0–100.0)
Monocytes Absolute: 1 10*3/uL (ref 0.1–1.0)
Monocytes Relative: 7 %
Neutro Abs: 11.5 10*3/uL — ABNORMAL HIGH (ref 1.7–7.7)
Neutrophils Relative %: 78 %
Platelet Count: 194 10*3/uL (ref 150–400)
RBC: 3.77 MIL/uL — ABNORMAL LOW (ref 4.22–5.81)
RDW: 14.1 % (ref 11.5–15.5)
WBC Count: 14.6 10*3/uL — ABNORMAL HIGH (ref 4.0–10.5)
nRBC: 0 % (ref 0.0–0.2)

## 2020-01-31 LAB — CMP (CANCER CENTER ONLY)
ALT: 24 U/L (ref 0–44)
AST: 12 U/L — ABNORMAL LOW (ref 15–41)
Albumin: 3 g/dL — ABNORMAL LOW (ref 3.5–5.0)
Alkaline Phosphatase: 77 U/L (ref 38–126)
Anion gap: 8 (ref 5–15)
BUN: 39 mg/dL — ABNORMAL HIGH (ref 6–20)
CO2: 21 mmol/L — ABNORMAL LOW (ref 22–32)
Calcium: 8.9 mg/dL (ref 8.9–10.3)
Chloride: 106 mmol/L (ref 98–111)
Creatinine: 1.81 mg/dL — ABNORMAL HIGH (ref 0.61–1.24)
GFR, Est AFR Am: 47 mL/min — ABNORMAL LOW (ref >60)
GFR, Estimated: 41 mL/min — ABNORMAL LOW (ref >60)
Glucose, Bld: 358 mg/dL — ABNORMAL HIGH (ref 70–99)
Potassium: 5.1 mmol/L (ref 3.5–5.1)
Sodium: 135 mmol/L (ref 135–145)
Total Bilirubin: 0.5 mg/dL (ref 0.3–1.2)
Total Protein: 6 g/dL — ABNORMAL LOW (ref 6.5–8.1)

## 2020-01-31 LAB — TSH: TSH: 0.892 u[IU]/mL (ref 0.320–4.118)

## 2020-01-31 MED ORDER — INSULIN REGULAR HUMAN 100 UNIT/ML IJ SOLN
7.0000 [IU] | Freq: Once | INTRAMUSCULAR | Status: AC
  Administered 2020-01-31: 7 [IU] via SUBCUTANEOUS

## 2020-01-31 MED ORDER — INSULIN REGULAR HUMAN 100 UNIT/ML IJ SOLN
INTRAMUSCULAR | Status: AC
  Filled 2020-01-31: qty 1

## 2020-01-31 MED ORDER — SODIUM CHLORIDE 0.9 % IV SOLN
100.0000 mg/m2 | Freq: Once | INTRAVENOUS | Status: AC
  Administered 2020-01-31: 190 mg via INTRAVENOUS
  Filled 2020-01-31: qty 9.5

## 2020-01-31 MED ORDER — PALONOSETRON HCL INJECTION 0.25 MG/5ML
INTRAVENOUS | Status: AC
  Filled 2020-01-31: qty 5

## 2020-01-31 MED ORDER — SODIUM CHLORIDE 0.9 % IV SOLN
150.0000 mg | Freq: Once | INTRAVENOUS | Status: AC
  Administered 2020-01-31: 12:00:00 150 mg via INTRAVENOUS
  Filled 2020-01-31: qty 150

## 2020-01-31 MED ORDER — PALONOSETRON HCL INJECTION 0.25 MG/5ML
0.2500 mg | Freq: Once | INTRAVENOUS | Status: AC
  Administered 2020-01-31: 12:00:00 0.25 mg via INTRAVENOUS

## 2020-01-31 MED ORDER — SODIUM CHLORIDE 0.9 % IV SOLN
10.0000 mg | Freq: Once | INTRAVENOUS | Status: AC
  Administered 2020-01-31: 10 mg via INTRAVENOUS
  Filled 2020-01-31: qty 10

## 2020-01-31 MED ORDER — SODIUM CHLORIDE 0.9 % IV SOLN
Freq: Once | INTRAVENOUS | Status: AC
  Filled 2020-01-31: qty 250

## 2020-01-31 MED ORDER — SODIUM CHLORIDE 0.9 % IV SOLN
367.0000 mg | Freq: Once | INTRAVENOUS | Status: AC
  Administered 2020-01-31: 370 mg via INTRAVENOUS
  Filled 2020-01-31: qty 37

## 2020-01-31 MED ORDER — SODIUM CHLORIDE 0.9 % IV SOLN
1500.0000 mg | Freq: Once | INTRAVENOUS | Status: AC
  Administered 2020-01-31: 1500 mg via INTRAVENOUS
  Filled 2020-01-31: qty 30

## 2020-01-31 NOTE — Progress Notes (Signed)
Per Cassandra Heilingoetter, PA ok to treat today.  Will need Insulin given in infusion suite and cbg rechecked 1 hour later.

## 2020-01-31 NOTE — Progress Notes (Signed)
Made Cassandra H PA aware of patient recheck of BS at 12:45 of 212 and no new orders received.

## 2020-01-31 NOTE — Progress Notes (Signed)
Met w/ pt since he was here for appts and he gave me consent to apply in his behalf so I completed the online applicationw/ the [facility] forImfinzi, Carboplatin and Etoposide and he was approved for $8,000 from 01/01/20 through 12/30/20.  I also informed him of the [employer], went over what it covers and gave him the income requirement.  He would like to apply so he provided proof of income and was approved for the $1000 grant.  I will give him an expense sheet and my card for any questions or concerns he may have in the future.

## 2020-01-31 NOTE — Patient Instructions (Signed)
[place] Discharge Instructions for Patients Receiving Chemotherapy  Today you received the following chemotherapy agents: Durvalumab, Carboplatin, Etoposide  To help prevent nausea and vomiting after your treatment, we encourage you to take your nausea medication as directed.   If you develop nausea and vomiting that is not controlled by your nausea medication, call the clinic.   BELOW ARE SYMPTOMS THAT SHOULD BE REPORTED IMMEDIATELY:  *FEVER GREATER THAN 100.5 F  *CHILLS WITH OR WITHOUT FEVER  NAUSEA AND VOMITING THAT IS NOT CONTROLLED WITH YOUR NAUSEA MEDICATION  *UNUSUAL SHORTNESS OF BREATH  *UNUSUAL BRUISING OR BLEEDING  TENDERNESS IN MOUTH AND THROAT WITH OR WITHOUT PRESENCE OF ULCERS  *URINARY PROBLEMS  *BOWEL PROBLEMS  UNUSUAL RASH Items with * indicate a potential emergency and should be followed up as soon as possible.  Feel free to call the clinic should you have any questions or concerns. The clinic phone number is [phone].  Please show the [place] at check-in to the Emergency Department and triage nurse.  Durvalumab injection What is this medicine? DURVALUMAB (dur VAL ue mab) is a monoclonal antibody. It is used to treat urothelial cancer and lung cancer. This medicine may be used for other purposes; ask your health care provider or pharmacist if you have questions. COMMON BRAND NAME(S): IMFINZI What should I tell my health care provider before I take this medicine? They need to know if you have any of these conditions:  diabetes  immune system problems  infection  inflammatory bowel disease  kidney disease  liver disease  lung or breathing disease  lupus  organ transplant  stomach or intestine problems  thyroid disease  an unusual or allergic reaction to durvalumab, other medicines, foods, dyes, or preservatives  pregnant or trying to get pregnant  breast-feeding How should I use this medicine? This  medicine is for infusion into a vein. It is given by a health care professional in a hospital or clinic setting. A special MedGuide will be given to you before each treatment. Be sure to read this information carefully each time. Talk to your pediatrician regarding the use of this medicine in children. Special care may be needed. Overdosage: If you think you have taken too much of this medicine contact a poison control center or emergency room at once. NOTE: This medicine is only for you. Do not share this medicine with others. What if I miss a dose? It is important not to miss your dose. Call your doctor or health care professional if you are unable to keep an appointment. What may interact with this medicine? Interactions have not been studied. This list may not describe all possible interactions. Give your health care provider a list of all the medicines, herbs, non-prescription drugs, or dietary supplements you use. Also tell them if you smoke, drink alcohol, or use illegal drugs. Some items may interact with your medicine. What should I watch for while using this medicine? This drug may make you feel generally unwell. Continue your course of treatment even though you feel ill unless your doctor tells you to stop. You may need blood work done while you are taking this medicine. Do not become pregnant while taking this medicine or for 3 months after stopping it. Women should inform their doctor if they wish to become pregnant or think they might be pregnant. There is a potential for serious side effects to an unborn child. Talk to your health care professional or pharmacist for more information. Do  not breast-feed an infant while taking this medicine or for 3 months after stopping it. What side effects may I notice from receiving this medicine? Side effects that you should report to your doctor or health care professional as soon as possible:  allergic reactions like skin rash, itching or hives,  swelling of the face, lips, or tongue  black, tarry stools  bloody or watery diarrhea  breathing problems  change in emotions or moods  change in sex drive  changes in vision  chest pain or chest tightness  chills  confusion  cough  facial flushing  fever  headache  signs and symptoms of high blood sugar such as dizziness; dry mouth; dry skin; fruity breath; nausea; stomach pain; increased hunger or thirst; increased urination  signs and symptoms of liver injury like dark yellow or brown urine; general ill feeling or flu-like symptoms; light-colored stools; loss of appetite; nausea; right upper belly pain; unusually weak or tired; yellowing of the eyes or skin  stomach pain  trouble passing urine or change in the amount of urine  weight gain or weight loss Side effects that usually do not require medical attention (report these to your doctor or health care professional if they continue or are bothersome):  bone pain  constipation  loss of appetite  muscle pain  nausea  swelling of the ankles, feet, hands  tiredness This list may not describe all possible side effects. Call your doctor for medical advice about side effects. You may report side effects to FDA at 1-800-FDA-1088. Where should I keep my medicine? This drug is given in a hospital or clinic and will not be stored at home. NOTE: This sheet is a summary. It may not cover all possible information. If you have questions about this medicine, talk to your doctor, pharmacist, or health care provider.  2020 Elsevier/Gold Standard (2016-12-09 19:25:04)  Carboplatin injection What is this medicine? CARBOPLATIN (KAR boe pla tin) is a chemotherapy drug. It targets fast dividing cells, like cancer cells, and causes these cells to die. This medicine is used to treat ovarian cancer and many other cancers. This medicine may be used for other purposes; ask your health care provider or pharmacist if you have  questions. COMMON BRAND NAME(S): Paraplatin What should I tell my health care provider before I take this medicine? They need to know if you have any of these conditions:  blood disorders  hearing problems  kidney disease  recent or ongoing radiation therapy  an unusual or allergic reaction to carboplatin, cisplatin, other chemotherapy, other medicines, foods, dyes, or preservatives  pregnant or trying to get pregnant  breast-feeding How should I use this medicine? This drug is usually given as an infusion into a vein. It is administered in a hospital or clinic by a specially trained health care professional. Talk to your pediatrician regarding the use of this medicine in children. Special care may be needed. Overdosage: If you think you have taken too much of this medicine contact a poison control center or emergency room at once. NOTE: This medicine is only for you. Do not share this medicine with others. What if I miss a dose? It is important not to miss a dose. Call your doctor or health care professional if you are unable to keep an appointment. What may interact with this medicine?  medicines for seizures  medicines to increase blood counts like filgrastim, pegfilgrastim, sargramostim  some antibiotics like amikacin, gentamicin, neomycin, streptomycin, tobramycin  vaccines Talk to your  doctor or health care professional before taking any of these medicines:  acetaminophen  aspirin  ibuprofen  ketoprofen  naproxen This list may not describe all possible interactions. Give your health care provider a list of all the medicines, herbs, non-prescription drugs, or dietary supplements you use. Also tell them if you smoke, drink alcohol, or use illegal drugs. Some items may interact with your medicine. What should I watch for while using this medicine? Your condition will be monitored carefully while you are receiving this medicine. You will need important blood work done  while you are taking this medicine. This drug may make you feel generally unwell. This is not uncommon, as chemotherapy can affect healthy cells as well as cancer cells. Report any side effects. Continue your course of treatment even though you feel ill unless your doctor tells you to stop. In some cases, you may be given additional medicines to help with side effects. Follow all directions for their use. Call your doctor or health care professional for advice if you get a fever, chills or sore throat, or other symptoms of a cold or flu. Do not treat yourself. This drug decreases your body's ability to fight infections. Try to avoid being around people who are sick. This medicine may increase your risk to bruise or bleed. Call your doctor or health care professional if you notice any unusual bleeding. Be careful brushing and flossing your teeth or using a toothpick because you may get an infection or bleed more easily. If you have any dental work done, tell your dentist you are receiving this medicine. Avoid taking products that contain aspirin, acetaminophen, ibuprofen, naproxen, or ketoprofen unless instructed by your doctor. These medicines may hide a fever. Do not become pregnant while taking this medicine. Women should inform their doctor if they wish to become pregnant or think they might be pregnant. There is a potential for serious side effects to an unborn child. Talk to your health care professional or pharmacist for more information. Do not breast-feed an infant while taking this medicine. What side effects may I notice from receiving this medicine? Side effects that you should report to your doctor or health care professional as soon as possible:  allergic reactions like skin rash, itching or hives, swelling of the face, lips, or tongue  signs of infection - fever or chills, cough, sore throat, pain or difficulty passing urine  signs of decreased platelets or bleeding - bruising, pinpoint  red spots on the skin, black, tarry stools, nosebleeds  signs of decreased red blood cells - unusually weak or tired, fainting spells, lightheadedness  breathing problems  changes in hearing  changes in vision  chest pain  high blood pressure  low blood counts - This drug may decrease the number of white blood cells, red blood cells and platelets. You may be at increased risk for infections and bleeding.  nausea and vomiting  pain, swelling, redness or irritation at the injection site  pain, tingling, numbness in the hands or feet  problems with balance, talking, walking  trouble passing urine or change in the amount of urine Side effects that usually do not require medical attention (report to your doctor or health care professional if they continue or are bothersome):  hair loss  loss of appetite  metallic taste in the mouth or changes in taste This list may not describe all possible side effects. Call your doctor for medical advice about side effects. You may report side effects to FDA at  1-800-FDA-1088. Where should I keep my medicine? This drug is given in a hospital or clinic and will not be stored at home. NOTE: This sheet is a summary. It may not cover all possible information. If you have questions about this medicine, talk to your doctor, pharmacist, or health care provider.  2020 Elsevier/Gold Standard (2008-01-04 14:38:05)  Etoposide, VP-16 injection What is this medicine? ETOPOSIDE, VP-16 (e toe POE side) is a chemotherapy drug. It is used to treat testicular cancer, lung cancer, and other cancers. This medicine may be used for other purposes; ask your health care provider or pharmacist if you have questions. COMMON BRAND NAME(S): Etopophos, Toposar, VePesid What should I tell my health care provider before I take this medicine? They need to know if you have any of these conditions:  infection  kidney disease  liver disease  low blood counts, like low  white cell, platelet, or red cell counts  an unusual or allergic reaction to etoposide, other medicines, foods, dyes, or preservatives  pregnant or trying to get pregnant  breast-feeding How should I use this medicine? This medicine is for infusion into a vein. It is administered in a hospital or clinic by a specially trained health care professional. Talk to your pediatrician regarding the use of this medicine in children. Special care may be needed. Overdosage: If you think you have taken too much of this medicine contact a poison control center or emergency room at once. NOTE: This medicine is only for you. Do not share this medicine with others. What if I miss a dose? It is important not to miss your dose. Call your doctor or health care professional if you are unable to keep an appointment. What may interact with this medicine? This medicine may interact with the following medications:  warfarin This list may not describe all possible interactions. Give your health care provider a list of all the medicines, herbs, non-prescription drugs, or dietary supplements you use. Also tell them if you smoke, drink alcohol, or use illegal drugs. Some items may interact with your medicine. What should I watch for while using this medicine? Visit your doctor for checks on your progress. This drug may make you feel generally unwell. This is not uncommon, as chemotherapy can affect healthy cells as well as cancer cells. Report any side effects. Continue your course of treatment even though you feel ill unless your doctor tells you to stop. In some cases, you may be given additional medicines to help with side effects. Follow all directions for their use. Call your doctor or health care professional for advice if you get a fever, chills or sore throat, or other symptoms of a cold or flu. Do not treat yourself. This drug decreases your body's ability to fight infections. Try to avoid being around people who  are sick. This medicine may increase your risk to bruise or bleed. Call your doctor or health care professional if you notice any unusual bleeding. Talk to your doctor about your risk of cancer. You may be more at risk for certain types of cancers if you take this medicine. Do not become pregnant while taking this medicine or for at least 6 months after stopping it. Women should inform their doctor if they wish to become pregnant or think they might be pregnant. Women of child-bearing potential will need to have a negative pregnancy test before starting this medicine. There is a potential for serious side effects to an unborn child. Talk to your health care  professional or pharmacist for more information. Do not breast-feed an infant while taking this medicine. Men must use a latex condom during sexual contact with a woman while taking this medicine and for at least 4 months after stopping it. A latex condom is needed even if you have had a vasectomy. Contact your doctor right away if your partner becomes pregnant. Do not donate sperm while taking this medicine and for at least 4 months after you stop taking this medicine. Men should inform their doctors if they wish to father a child. This medicine may lower sperm counts. What side effects may I notice from receiving this medicine? Side effects that you should report to your doctor or health care professional as soon as possible:  allergic reactions like skin rash, itching or hives, swelling of the face, lips, or tongue  low blood counts - this medicine may decrease the number of white blood cells, red blood cells, and platelets. You may be at increased risk for infections and bleeding  nausea, vomiting  redness, blistering, peeling or loosening of the skin, including inside the mouth  signs and symptoms of infection like fever; chills; cough; sore throat; pain or trouble passing urine  signs and symptoms of low red blood cells or anemia such as  unusually weak or tired; feeling faint or lightheaded; falls; breathing problems  unusual bruising or bleeding Side effects that usually do not require medical attention (report to your doctor or health care professional if they continue or are bothersome):  changes in taste  diarrhea  hair loss  loss of appetite  mouth sores This list may not describe all possible side effects. Call your doctor for medical advice about side effects. You may report side effects to FDA at 1-800-FDA-1088. Where should I keep my medicine? This drug is given in a hospital or clinic and will not be stored at home. NOTE: This sheet is a summary. It may not cover all possible information. If you have questions about this medicine, talk to your doctor, pharmacist, or health care provider.  2020 Elsevier/Gold Standard (2018-11-24 16:57:15)

## 2020-02-01 ENCOUNTER — Other Ambulatory Visit: Payer: Self-pay

## 2020-02-01 ENCOUNTER — Inpatient Hospital Stay: Payer: Medicare Other

## 2020-02-01 VITALS — BP 107/75 | HR 91 | Temp 98.9°F | Resp 17

## 2020-02-01 DIAGNOSIS — C3411 Malignant neoplasm of upper lobe, right bronchus or lung: Secondary | ICD-10-CM

## 2020-02-01 DIAGNOSIS — Z5112 Encounter for antineoplastic immunotherapy: Secondary | ICD-10-CM | POA: Diagnosis not present

## 2020-02-01 LAB — GLUCOSE, CAPILLARY: Glucose-Capillary: 212 mg/dL — ABNORMAL HIGH (ref 70–99)

## 2020-02-01 MED ORDER — SODIUM CHLORIDE 0.9 % IV SOLN
Freq: Once | INTRAVENOUS | Status: AC
  Filled 2020-02-01: qty 250

## 2020-02-01 MED ORDER — SODIUM CHLORIDE 0.9 % IV SOLN
100.0000 mg/m2 | Freq: Once | INTRAVENOUS | Status: AC
  Administered 2020-02-01: 190 mg via INTRAVENOUS
  Filled 2020-02-01: qty 9.5

## 2020-02-01 MED ORDER — SODIUM CHLORIDE 0.9 % IV SOLN
10.0000 mg | Freq: Once | INTRAVENOUS | Status: AC
  Administered 2020-02-01: 10 mg via INTRAVENOUS
  Filled 2020-02-01: qty 10

## 2020-02-01 NOTE — Patient Instructions (Signed)
[facility] Discharge Instructions for Patients Receiving Chemotherapy  Today you received the following chemotherapy agents: Etoposide  To help prevent nausea and vomiting after your treatment, we encourage you to take your nausea medication as directed.    If you develop nausea and vomiting that is not controlled by your nausea medication, call the clinic.   BELOW ARE SYMPTOMS THAT SHOULD BE REPORTED IMMEDIATELY:  *FEVER GREATER THAN 100.5 F  *CHILLS WITH OR WITHOUT FEVER  NAUSEA AND VOMITING THAT IS NOT CONTROLLED WITH YOUR NAUSEA MEDICATION  *UNUSUAL SHORTNESS OF BREATH  *UNUSUAL BRUISING OR BLEEDING  TENDERNESS IN MOUTH AND THROAT WITH OR WITHOUT PRESENCE OF ULCERS  *URINARY PROBLEMS  *BOWEL PROBLEMS  UNUSUAL RASH Items with * indicate a potential emergency and should be followed up as soon as possible.  Feel free to call the clinic should you have any questions or concerns. The clinic phone number is [phone].  Please show the [place] at check-in to the Emergency Department and triage nurse.

## 2020-02-02 ENCOUNTER — Telehealth: Payer: Self-pay | Admitting: Physician Assistant

## 2020-02-02 ENCOUNTER — Telehealth: Payer: Self-pay | Admitting: *Deleted

## 2020-02-02 ENCOUNTER — Inpatient Hospital Stay: Payer: Medicare Other

## 2020-02-02 ENCOUNTER — Other Ambulatory Visit: Payer: Self-pay

## 2020-02-02 VITALS — BP 117/82 | HR 88 | Temp 98.9°F | Resp 17

## 2020-02-02 DIAGNOSIS — C3411 Malignant neoplasm of upper lobe, right bronchus or lung: Secondary | ICD-10-CM

## 2020-02-02 DIAGNOSIS — Z5112 Encounter for antineoplastic immunotherapy: Secondary | ICD-10-CM | POA: Diagnosis not present

## 2020-02-02 MED ORDER — SODIUM CHLORIDE 0.9 % IV SOLN
10.0000 mg | Freq: Once | INTRAVENOUS | Status: AC
  Administered 2020-02-02: 10 mg via INTRAVENOUS
  Filled 2020-02-02: qty 10

## 2020-02-02 MED ORDER — SODIUM CHLORIDE 0.9 % IV SOLN
Freq: Once | INTRAVENOUS | Status: AC
  Filled 2020-02-02: qty 250

## 2020-02-02 MED ORDER — SODIUM CHLORIDE 0.9 % IV SOLN
100.0000 mg/m2 | Freq: Once | INTRAVENOUS | Status: AC
  Administered 2020-02-02: 190 mg via INTRAVENOUS
  Filled 2020-02-02: qty 9.5

## 2020-02-02 NOTE — Telephone Encounter (Signed)
Patient is here today for day 3 of treatment.  Thus far according to his infusion Nurse Lexine Baton he has tolerated the first two days of treatment well without any incident.

## 2020-02-02 NOTE — Telephone Encounter (Signed)
Scheduled per los. Called and left msg. Mailed printout of added appts

## 2020-02-02 NOTE — Telephone Encounter (Signed)
-----   Message from Scot Dock, RN sent at 01/31/2020  3:31 PM EDT ----- Regarding: Dr. Julien Nordmann 1st time chemo follow up - tolerated well

## 2020-02-02 NOTE — Patient Instructions (Signed)
[facility] Discharge Instructions for Patients Receiving Chemotherapy  Today you received the following chemotherapy agents: Etoposide.  To help prevent nausea and vomiting after your treatment, we encourage you to take your nausea medication as directed.   If you develop nausea and vomiting that is not controlled by your nausea medication, call the clinic.   BELOW ARE SYMPTOMS THAT SHOULD BE REPORTED IMMEDIATELY:  *FEVER GREATER THAN 100.5 F  *CHILLS WITH OR WITHOUT FEVER  NAUSEA AND VOMITING THAT IS NOT CONTROLLED WITH YOUR NAUSEA MEDICATION  *UNUSUAL SHORTNESS OF BREATH  *UNUSUAL BRUISING OR BLEEDING  TENDERNESS IN MOUTH AND THROAT WITH OR WITHOUT PRESENCE OF ULCERS  *URINARY PROBLEMS  *BOWEL PROBLEMS  UNUSUAL RASH Items with * indicate a potential emergency and should be followed up as soon as possible.  Feel free to call the clinic should you have any questions or concerns. The clinic phone number is [phone].  Please show the [place] at check-in to the Emergency Department and triage nurse.

## 2020-02-03 ENCOUNTER — Ambulatory Visit (INDEPENDENT_AMBULATORY_CARE_PROVIDER_SITE_OTHER): Payer: Medicare Other | Admitting: Pharmacist

## 2020-02-03 DIAGNOSIS — Z7901 Long term (current) use of anticoagulants: Secondary | ICD-10-CM

## 2020-02-03 DIAGNOSIS — I428 Other cardiomyopathies: Secondary | ICD-10-CM

## 2020-02-03 LAB — POCT INR: INR: 3.8 — AB (ref 2.0–3.0)

## 2020-02-03 NOTE — Patient Instructions (Signed)
Hold coumadin today then start taking1 tablet every day except 1/2 tablet on Mondays, Wednesdays, Fridays and Sundays. Recheck in 3 weeks.  Main # [phone] Coumad[facility] Clinic # [phone].

## 2020-02-04 ENCOUNTER — Other Ambulatory Visit: Payer: Self-pay

## 2020-02-04 ENCOUNTER — Inpatient Hospital Stay: Payer: Medicare Other

## 2020-02-04 VITALS — BP 87/61 | HR 99 | Temp 98.9°F | Resp 18

## 2020-02-04 DIAGNOSIS — C3411 Malignant neoplasm of upper lobe, right bronchus or lung: Secondary | ICD-10-CM

## 2020-02-04 DIAGNOSIS — Z5112 Encounter for antineoplastic immunotherapy: Secondary | ICD-10-CM | POA: Diagnosis not present

## 2020-02-04 MED ORDER — PEGFILGRASTIM-CBQV 6 MG/0.6ML ~~LOC~~ SOSY
6.0000 mg | PREFILLED_SYRINGE | Freq: Once | SUBCUTANEOUS | Status: AC
  Administered 2020-02-04: 6 mg via SUBCUTANEOUS

## 2020-02-07 ENCOUNTER — Encounter: Payer: Self-pay | Admitting: Internal Medicine

## 2020-02-07 ENCOUNTER — Inpatient Hospital Stay (HOSPITAL_BASED_OUTPATIENT_CLINIC_OR_DEPARTMENT_OTHER): Payer: Medicare Other | Admitting: Internal Medicine

## 2020-02-07 ENCOUNTER — Other Ambulatory Visit: Payer: Self-pay

## 2020-02-07 ENCOUNTER — Inpatient Hospital Stay: Payer: Medicare Other

## 2020-02-07 VITALS — BP 86/62 | HR 90 | Temp 99.1°F | Resp 18 | Ht 72.0 in | Wt 157.1 lb

## 2020-02-07 DIAGNOSIS — Z5112 Encounter for antineoplastic immunotherapy: Secondary | ICD-10-CM

## 2020-02-07 DIAGNOSIS — Z5111 Encounter for antineoplastic chemotherapy: Secondary | ICD-10-CM | POA: Diagnosis not present

## 2020-02-07 DIAGNOSIS — C3490 Malignant neoplasm of unspecified part of unspecified bronchus or lung: Secondary | ICD-10-CM

## 2020-02-07 DIAGNOSIS — C3411 Malignant neoplasm of upper lobe, right bronchus or lung: Secondary | ICD-10-CM

## 2020-02-07 LAB — CBC WITH DIFFERENTIAL (CANCER CENTER ONLY)
Abs Immature Granulocytes: 0.65 10*3/uL — ABNORMAL HIGH (ref 0.00–0.07)
Basophils Absolute: 0.1 10*3/uL (ref 0.0–0.1)
Basophils Relative: 0 %
Eosinophils Absolute: 0.1 10*3/uL (ref 0.0–0.5)
Eosinophils Relative: 1 %
HCT: 33.7 % — ABNORMAL LOW (ref 39.0–52.0)
Hemoglobin: 10.4 g/dL — ABNORMAL LOW (ref 13.0–17.0)
Immature Granulocytes: 5 %
Lymphocytes Relative: 9 %
Lymphs Abs: 1.3 10*3/uL (ref 0.7–4.0)
MCH: 30.9 pg (ref 26.0–34.0)
MCHC: 30.9 g/dL (ref 30.0–36.0)
MCV: 100 fL (ref 80.0–100.0)
Monocytes Absolute: 0.2 10*3/uL (ref 0.1–1.0)
Monocytes Relative: 1 %
Neutro Abs: 11.6 10*3/uL — ABNORMAL HIGH (ref 1.7–7.7)
Neutrophils Relative %: 84 %
Platelet Count: 129 10*3/uL — ABNORMAL LOW (ref 150–400)
RBC: 3.37 MIL/uL — ABNORMAL LOW (ref 4.22–5.81)
RDW: 14.2 % (ref 11.5–15.5)
WBC Count: 13.8 10*3/uL — ABNORMAL HIGH (ref 4.0–10.5)
nRBC: 0 % (ref 0.0–0.2)

## 2020-02-07 LAB — CMP (CANCER CENTER ONLY)
ALT: 31 U/L (ref 0–44)
AST: 18 U/L (ref 15–41)
Albumin: 3 g/dL — ABNORMAL LOW (ref 3.5–5.0)
Alkaline Phosphatase: 112 U/L (ref 38–126)
Anion gap: 6 (ref 5–15)
BUN: 38 mg/dL — ABNORMAL HIGH (ref 6–20)
CO2: 23 mmol/L (ref 22–32)
Calcium: 8.8 mg/dL — ABNORMAL LOW (ref 8.9–10.3)
Chloride: 106 mmol/L (ref 98–111)
Creatinine: 1.91 mg/dL — ABNORMAL HIGH (ref 0.61–1.24)
GFR, Est AFR Am: 44 mL/min — ABNORMAL LOW (ref >60)
GFR, Estimated: 38 mL/min — ABNORMAL LOW (ref >60)
Glucose, Bld: 249 mg/dL — ABNORMAL HIGH (ref 70–99)
Potassium: 5.1 mmol/L (ref 3.5–5.1)
Sodium: 135 mmol/L (ref 135–145)
Total Bilirubin: 0.8 mg/dL (ref 0.3–1.2)
Total Protein: 6.2 g/dL — ABNORMAL LOW (ref 6.5–8.1)

## 2020-02-07 NOTE — Patient Instructions (Signed)
Steps to Quit Smoking Smoking tobacco is the leading cause of preventable death. It can affect almost every organ in the body. Smoking puts you and people around you at risk for many serious, long-lasting (chronic) diseases. Quitting smoking can be hard, but it is one of the best things that you can do for your health. It is never too late to quit. How do I get ready to quit? When you decide to quit smoking, make a plan to help you succeed. Before you quit:  Pick a date to quit. Set a date within the next 2 weeks to give you time to prepare.  Write down the reasons why you are quitting. Keep this list in places where you will see it often.  Tell your family, friends, and co-workers that you are quitting. Their support is important.  Talk with your doctor about the choices that may help you quit.  Find out if your health insurance will pay for these treatments.  Know the people, places, things, and activities that make you want to smoke (triggers). Avoid them. What first steps can I take to quit smoking?  Throw away all cigarettes at home, at work, and in your car.  Throw away the things that you use when you smoke, such as ashtrays and lighters.  Clean your car. Make sure to empty the ashtray.  Clean your home, including curtains and carpets. What can I do to help me quit smoking? Talk with your doctor about taking medicines and seeing a counselor at the same time. You are more likely to succeed when you do both.  If you are pregnant or breastfeeding, talk with your doctor about counseling or other ways to quit smoking. Do not take medicine to help you quit smoking unless your doctor tells you to do so. To quit smoking: Quit right away  Quit smoking totally, instead of slowly cutting back on how much you smoke over a period of time.  Go to counseling. You are more likely to quit if you go to counseling sessions regularly. Take medicine You may take medicines to help you quit. Some  medicines need a prescription, and some you can buy over-the-counter. Some medicines may contain a drug called nicotine to replace the nicotine in cigarettes. Medicines may:  Help you to stop having the desire to smoke (cravings).  Help to stop the problems that come when you stop smoking (withdrawal symptoms). Your doctor may ask you to use:  Nicotine patches, gum, or lozenges.  Nicotine inhalers or sprays.  Non-nicotine medicine that is taken by mouth. Find resources Find resources and other ways to help you quit smoking and remain smoke-free after you quit. These resources are most helpful when you use them often. They include:  Online chats with a counselor.  Phone quitlines.  Printed self-help materials.  Support groups or group counseling.  Text messaging programs.  Mobile phone apps. Use apps on your mobile phone or tablet that can help you stick to your quit plan. There are many free apps for mobile phones and tablets as well as websites. Examples include Quit Guide from the CDC and smokefree.gov  What things can I do to make it easier to quit?   Talk to your family and friends. Ask them to support and encourage you.  Call a phone quitline (1-800-QUIT-NOW), reach out to support groups, or work with a counselor.  Ask people who smoke to not smoke around you.  Avoid places that make you want to smoke,   such as: ? Bars. ? Parties. ? Smoke-break areas at work.  Spend time with people who do not smoke.  Lower the stress in your life. Stress can make you want to smoke. Try these things to help your stress: ? Getting regular exercise. ? Doing deep-breathing exercises. ? Doing yoga. ? Meditating. ? Doing a body scan. To do this, close your eyes, focus on one area of your body at a time from head to toe. Notice which parts of your body are tense. Try to relax the muscles in those areas. How will I feel when I quit smoking? Day 1 to 3 weeks Within the first 24 hours,  you may start to have some problems that come from quitting tobacco. These problems are very bad 2-3 days after you quit, but they do not often last for more than 2-3 weeks. You may get these symptoms:  Mood swings.  Feeling restless, nervous, angry, or annoyed.  Trouble concentrating.  Dizziness.  Strong desire for high-sugar foods and nicotine.  Weight gain.  Trouble pooping (constipation).  Feeling like you may vomit (nausea).  Coughing or a sore throat.  Changes in how the medicines that you take for other issues work in your body.  Depression.  Trouble sleeping (insomnia). Week 3 and afterward After the first 2-3 weeks of quitting, you may start to notice more positive results, such as:  Better sense of smell and taste.  Less coughing and sore throat.  Slower heart rate.  Lower blood pressure.  Clearer skin.  Better breathing.  Fewer sick days. Quitting smoking can be hard. Do not give up if you fail the first time. Some people need to try a few times before they succeed. Do your best to stick to your quit plan, and talk with your doctor if you have any questions or concerns. Summary  Smoking tobacco is the leading cause of preventable death. Quitting smoking can be hard, but it is one of the best things that you can do for your health.  When you decide to quit smoking, make a plan to help you succeed.  Quit smoking right away, not slowly over a period of time.  When you start quitting, seek help from your doctor, family, or friends. This information is not intended to replace advice given to you by your health care provider. Make sure you discuss any questions you have with your health care provider. Document Revised: 06/24/2019 Document Reviewed: 12/18/2018 Elsevier Patient Education  2020 Elsevier Inc.  

## 2020-02-07 NOTE — Progress Notes (Signed)
[place] [phone]   Fax:([phone]  OFFICE PROGRESS NOTE  System, Pcp Not In No address on file  DIAGNOSIS: Extensive stage small cell lung cancer. He presented with a spiculated right middle lobe pulmonary nodule and mildly enlargedsolitary aortopulmonary lymph node. 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 He was diagnosed in March 2021.  PRIOR THERAPY: 1) Whole brain radiation under the care of Dr. Lisbeth Renshaw. Completed 01/17/2020  CURRENT THERAPY: Systemic chemotherapy withcarboplatin for an AUC of 5 on day 1,etoposide 100 mg/m on days 1, 2, and 3, and Imfinzi 1500 mg on day 1 IV every 3 weeks. First dose expected on4/20/21. Status post 1 cycle.  INTERVAL HISTORY: Dennis [name]. 59 y.o. male returns to the clinic today for follow-up visit.  The patient tolerated the first week of his treatment fairly well except for fatigue and aching pain few days after the treatment likely secondary to the Neulasta injection.  He is feeling much better today.  He denied having any current chest pain, shortness of breath, cough or hemoptysis.  He denied having any recent weight loss or night sweats.  He has no nausea, vomiting, diarrhea or constipation.  He has good appetite.  He is here today for evaluation 1 week after his treatment for management of any adverse effects.  MEDICAL HISTORY: Past Medical History:  Diagnosis Date   Alcohol abuse with alcohol-induced mood disorder ([place]) 08/18/2015   Automatic implantable cardioverter-defibrillator in situ 2012   S/P St Jude Dual Chamber. ICD.   CAD (coronary artery disease) 11/16/2014   CAD Coronary angiography (12/2008) with mid LAD totally occluded and collateralized.   Cardiac LV ejection fraction 10-20%    Chest pain 09/04/2018   CHF (congestive heart failure) ([facility])    Chronic systolic heart failure ([place])    a) Mixed ICM/NICM b) RHC (05/2014): RA  2, RV 19/2/3, PA 22/14 (18), PCWP 6, Fick CO/CI: 5.2 / 2.7, PVR 2.3 WU, PA 60% and 64% c) ECHO (05/2014): EF 20-25%, diff HK, akinesis entireanteroseptal myocardium, triv AI, mod MR, LA mod/sev dilated   Cocaine abuse ([place]) 01/24/2015   Cocaine abuse with cocaine-induced mood disorder ([place]) 08/20/2015   Drug abuse and dependence ([facility])    Dysphagia following cerebral infarction 6/60/6004   Embolic stroke involving right middle cerebral artery [facility])    Extensive stage primary small cell carcinoma of lung ([place]) 01/04/2020   H/O noncompliance with medical treatment, presenting hazards to health 01/24/2015   Heart murmur    High cholesterol    Ischemic cardiomyopathy    a) Coronary angiography (12/2008) at [facility]: Lmain: nl, LAD mid 100% stenosis with left to left and right-to-left collaterals to the distal LAD; Lcx: nl, RCA nl.     Left ventricular noncompaction [facility])    MDD (major depressive disorder), recurrent severe, without psychosis ([place]) 08/18/2015   Open wound of foot 02/27/2015   Pneumonia 1988   Psychoactive substance-induced mood disorder ([place]) 07/17/2015   Sleep apnea    "cleared after T&A"   Status post PICC central line placement    Stroke [facility])    Substance induced mood disorder ([place]) 08/17/2015   Type II diabetes mellitus ([place])     ALLERGIES:  has No Known Allergies.  MEDICATIONS:  Current Outpatient Medications  Medication Sig Dispense Refill   Blood Glucose Monitoring Suppl (FIFTY50 GLUCOSE METER 2.0) w/Device KIT Use as instructed     metFORMIN (  GLUCOPHAGE-XR) 500 MG 24 hr tablet Take 1,000 mg by mouth daily with breakfast. Per [facility] pharmacist     Accu-Chek Softclix Lancets lancets      atorvastatin (LIPITOR) 20 MG tablet TAKE 1 TABLET BY MOUTH DAILY AT 6 PM. (Patient taking differently: Take 20 mg by mouth daily at 6 PM. ) 30 tablet 6   carvedilol (COREG) 6.25 MG tablet Take 1.5 tablets (9.375 mg total) by mouth 2 (two) times daily with a meal. 90  tablet 3   dexamethasone (DECADRON) 4 MG tablet Take 1 tablet (4 mg total) by mouth 2 (two) times daily. Please also instruct pt on taking omeprazole once daily while taking steroids (Patient not taking: Reported on 02/07/2020) 60 tablet 0   empagliflozin (JARDIANCE) 10 MG TABS tablet Take 10 mg by mouth daily before breakfast. 30 tablet 6   Insulin Glargine (LANTUS SOLOSTAR) 100 UNIT/ML Solostar Pen Inject 36 Units into the skin daily at 10 pm.      memantine (NAMENDA) 5 MG tablet Start on 1st day of radiation. Week 1: take one po daily. Week 2: take one po twice daily. Week 3: take two po qam, and one po q pm. Week 4: take 2 po twice daily for remaining 20 weeks. 630 tablet 0   prochlorperazine (COMPAZINE) 10 MG tablet Take 1 tablet (10 mg total) by mouth every 6 (six) hours as needed. 30 tablet 2   sacubitril-valsartan (ENTRESTO) 24-26 MG Take 1 tablet by mouth 2 (two) times daily. 60 tablet 3   spironolactone (ALDACTONE) 25 MG tablet Take 1 tablet (25 mg total) by mouth daily. 90 tablet 3   warfarin (COUMADIN) 7.5 MG tablet TAKE 1 TABLET BY MOUTH DAILY AT 6PM. 30 tablet 0   No current facility-administered medications for this visit.    SURGICAL HISTORY:  Past Surgical History:  Procedure Laterality Date   BRONCHIAL BRUSHINGS  12/28/2019   Procedure: BRONCHIAL BRUSHINGS;  Surgeon: Garner Nash, DO;  Location: [place];  Service: Thoracic;;   BRONCHIAL NEEDLE ASPIRATION BIOPSY  12/28/2019   Procedure: BRONCHIAL NEEDLE ASPIRATION BIOPSIES;  Surgeon: Garner Nash, DO;  Location: [place];  Service: Thoracic;;   BRONCHIAL WASHINGS  12/28/2019   Procedure: BRONCHIAL WASHINGS;  Surgeon: Garner Nash, DO;  Location: [place];  Service: Thoracic;;   CARDIAC CATHETERIZATION  05/2014   CARDIAC DEFIBRILLATOR PLACEMENT  03/2011   CHOLECYSTECTOMY  1994   FOREARM FRACTURE SURGERY Left 1980   FRACTURE SURGERY     RIGHT HEART CATHETERIZATION N/A 05/30/2014    Procedure: RIGHT HEART CATH;  Surgeon: Jolaine Artist, MD;  Location: [facility] CATH LAB;  Service: Cardiovascular;  Laterality: N/A;   RIGHT HEART CATHETERIZATION N/A 02/07/2015   Procedure: RIGHT HEART CATH;  Surgeon: Jolaine Artist, MD;  Location: [facility] CATH LAB;  Service: Cardiovascular;  Laterality: N/A;   TONSILLECTOMY AND ADENOIDECTOMY  ~ [place] N/A 12/28/2019   Procedure: VIDEO BRONCHOSCOPY WITH ENDOBRONCHIAL ULTRASOUND;  Surgeon: Garner Nash, DO;  Location: [facility] ENDOSCOPY;  Service: Thoracic;  Laterality: N/A;    REVIEW OF SYSTEMS:  A comprehensive review of systems was negative except for: Constitutional: positive for fatigue   PHYSICAL EXAMINATION: General appearance: alert, cooperative and no distress Head: Normocephalic, without obvious abnormality, atraumatic Neck: no adenopathy, no JVD, supple, symmetrical, trachea midline and thyroid not enlarged, symmetric, no tenderness/mass/nodules Lymph nodes: Cervical, supraclavicular, and axillary nodes normal. Resp: clear to auscultation bilaterally Back: symmetric, no curvature. ROM  normal. No CVA tenderness. Cardio: regular rate and rhythm, S1, S2 normal, no murmur, click, rub or gallop GI: soft, non-tender; bowel sounds normal; no masses,  no organomegaly Extremities: extremities normal, atraumatic, no cyanosis or edema  ECOG PERFORMANCE STATUS: 1 - Symptomatic but completely ambulatory  Blood pressure (!) 86/62, pulse 90, temperature 99.1 F (37.3 C), temperature source Temporal, resp. rate 18, height 6' (1.829 m), weight 157 lb 1.6 oz (71.3 kg), SpO2 100 %.  LABORATORY DATA: Lab Results  Component Value Date   WBC 13.8 (H) 02/07/2020   HGB 10.4 (L) 02/07/2020   HCT 33.7 (L) 02/07/2020   MCV 100.0 02/07/2020   PLT 129 (L) 02/07/2020      Chemistry      Component Value Date/Time   NA 135 02/07/2020 1152   K 5.1 02/07/2020 1152   CL 106 02/07/2020 1152   CO2 23  02/07/2020 1152   BUN 38 (H) 02/07/2020 1152   CREATININE 1.91 (H) 02/07/2020 1152      Component Value Date/Time   CALCIUM 8.8 (L) 02/07/2020 1152   ALKPHOS 112 02/07/2020 1152   AST 18 02/07/2020 1152   ALT 31 02/07/2020 1152   BILITOT 0.8 02/07/2020 1152       RADIOGRAPHIC STUDIES: NM PET Image Initial (PI) Skull Base To Thigh  Result Date: 01/17/2020 CLINICAL DATA:  Initial treatment strategy for metastatic small cell lung cancer. EXAM: NUCLEAR MEDICINE PET SKULL BASE TO THIGH TECHNIQUE: 8.05 mCi F-18 FDG was injected intravenously. Full-ring PET imaging was performed from the skull base to thigh after the radiotracer. CT data was obtained and used for attenuation correction and anatomic localization. Fasting blood glucose: 220 mg/dl COMPARISON:  Chest CT 12/23/2019 FINDINGS: Mediastinal blood pool activity: SUV max 1.77 Liver activity: SUV max NA NECK: No hypermetabolic lymph nodes in the neck. Incidental CT findings: none CHEST: The right middle lobe lesion is slightly smaller and less well-defined than on the prior CT scan. It measures a maximum of 9 mm. No hypermetabolism is demonstrated. SUV max is 0.6. Mild FDG uptake in the subcarinal node with SUV max of 3.27. No other areas of abnormal FDG uptake in the mediastinum or hilum. No supraclavicular or axillary adenopathy. Stable underlying emphysematous changes and pulmonary scarring. No other pulmonary nodules are identified. Stable left basilar scarring changes. Incidental CT findings: none ABDOMEN/PELVIS: No abnormal hypermetabolic activity within the liver, pancreas, adrenal glands, or spleen. No hypermetabolic lymph nodes in the abdomen or pelvis. Incidental CT findings: Benign 2.9 cm right adrenal gland adenoma. Status post cholecystectomy. Age advanced atherosclerotic calcifications involving the aorta and iliac arteries. Right renal calculus. SKELETON: No focal hypermetabolic activity to suggest skeletal metastasis. Incidental CT  findings: none IMPRESSION: 1. The small right middle lobe lesion is non hypermetabolic. The subcarinal lymph node is mildly hypermetabolic. No other sites of adenopathy or metastatic disease are identified. 2. Benign right adrenal gland adenoma. Electronically Signed   By: Marijo Sanes M.D.   On: 01/17/2020 10:32    ASSESSMENT AND PLAN: This is a very pleasant 59 years old African-American male recently diagnosed with extensive stage small cell lung cancer presented with spiculated right middle lobe pulmonary nodule in addition to mediastinal lymphadenopathy and multiple metastatic brain lesions.  The patient is status post whole brain irradiation. He is currently undergoing systemic chemotherapy with carboplatin, etoposide and Imfinzi status post 1 cycle started last week. The patient tolerated the first cycle well except for fatigue from the Neulasta injection for few days.  He is feeling much better now. I recommended for the patient to continue his treatment as planned and he is expected to start cycle #2 in 2 weeks. He was advised to call immediately if he has any concerning symptoms in the interval. The patient voices understanding of current disease status and treatment options and is in agreement with the current care plan.  All questions were answered. The patient knows to call the clinic with any problems, questions or concerns. We can certainly see the patient much sooner if necessary.  Disclaimer: This note was dictated with voice recognition software. Similar sounding words can inadvertently be transcribed and may not be corrected upon review.

## 2020-02-09 ENCOUNTER — Telehealth: Payer: Self-pay | Admitting: Radiation Oncology

## 2020-02-09 NOTE — Telephone Encounter (Signed)
  Radiation Oncology         [phone]) [phone] ________________________________  Name: Dennis [name]. MRN: [id number]  Date of Service: 02/13/20  DOB: June 28, 1961  Post Treatment Telephone Note  Diagnosis:  Extensive stage small cell lung cancer presented with spiculated right middle lobe pulmonary nodule in addition to mediastinal lymphadenopathy and multiple metastatic brain lesions  Interval Since Last Radiation:  4 weeks   01/04/20-01/17/20: The whole brain was treated to 30 Gy in 10 fractions.  Narrative:  The patient was contacted today for routine follow-up. During treatment he did very well with radiotherapy and did not have significant desquamation.   Impression/Plan: 1. Extensive stage small cell lung cancer presented with spiculated right middle lobe pulmonary nodule in addition to mediastinal lymphadenopathy and multiple metastatic brain lesions. The patient did not answer my call today but I discussed that we would be happy to continue to follow him in our brain oncology program with MRI in 3 month, and that he will also continue to follow up with Dr. Julien Nordmann in medical oncology.     Carola Rhine, PAC

## 2020-02-14 ENCOUNTER — Inpatient Hospital Stay: Payer: Medicare Other | Attending: Radiation Oncology

## 2020-02-14 ENCOUNTER — Other Ambulatory Visit: Payer: Self-pay

## 2020-02-14 DIAGNOSIS — C342 Malignant neoplasm of middle lobe, bronchus or lung: Secondary | ICD-10-CM | POA: Diagnosis present

## 2020-02-14 DIAGNOSIS — C7931 Secondary malignant neoplasm of brain: Secondary | ICD-10-CM | POA: Diagnosis not present

## 2020-02-14 DIAGNOSIS — Z5112 Encounter for antineoplastic immunotherapy: Secondary | ICD-10-CM | POA: Diagnosis present

## 2020-02-14 DIAGNOSIS — Z5189 Encounter for other specified aftercare: Secondary | ICD-10-CM | POA: Insufficient documentation

## 2020-02-14 DIAGNOSIS — Z79899 Other long term (current) drug therapy: Secondary | ICD-10-CM | POA: Insufficient documentation

## 2020-02-14 DIAGNOSIS — R110 Nausea: Secondary | ICD-10-CM | POA: Diagnosis not present

## 2020-02-14 DIAGNOSIS — E119 Type 2 diabetes mellitus without complications: Secondary | ICD-10-CM | POA: Insufficient documentation

## 2020-02-14 DIAGNOSIS — Z794 Long term (current) use of insulin: Secondary | ICD-10-CM | POA: Diagnosis not present

## 2020-02-14 DIAGNOSIS — Z5111 Encounter for antineoplastic chemotherapy: Secondary | ICD-10-CM | POA: Insufficient documentation

## 2020-02-14 DIAGNOSIS — C3490 Malignant neoplasm of unspecified part of unspecified bronchus or lung: Secondary | ICD-10-CM

## 2020-02-14 LAB — CBC WITH DIFFERENTIAL (CANCER CENTER ONLY)
Abs Immature Granulocytes: 6.11 10*3/uL — ABNORMAL HIGH (ref 0.00–0.07)
Basophils Absolute: 0 10*3/uL (ref 0.0–0.1)
Basophils Relative: 0 %
Eosinophils Absolute: 0.1 10*3/uL (ref 0.0–0.5)
Eosinophils Relative: 0 %
HCT: 32.1 % — ABNORMAL LOW (ref 39.0–52.0)
Hemoglobin: 9.8 g/dL — ABNORMAL LOW (ref 13.0–17.0)
Immature Granulocytes: 21 %
Lymphocytes Relative: 11 %
Lymphs Abs: 3.3 10*3/uL (ref 0.7–4.0)
MCH: 31 pg (ref 26.0–34.0)
MCHC: 30.5 g/dL (ref 30.0–36.0)
MCV: 101.6 fL — ABNORMAL HIGH (ref 80.0–100.0)
Monocytes Absolute: 3.4 10*3/uL — ABNORMAL HIGH (ref 0.1–1.0)
Monocytes Relative: 12 %
Neutro Abs: 16.2 10*3/uL — ABNORMAL HIGH (ref 1.7–7.7)
Neutrophils Relative %: 56 %
Platelet Count: 170 10*3/uL (ref 150–400)
RBC: 3.16 MIL/uL — ABNORMAL LOW (ref 4.22–5.81)
RDW: 14.8 % (ref 11.5–15.5)
WBC Count: 29 10*3/uL — ABNORMAL HIGH (ref 4.0–10.5)
nRBC: 4 % — ABNORMAL HIGH (ref 0.0–0.2)

## 2020-02-14 LAB — CMP (CANCER CENTER ONLY)
ALT: 25 U/L (ref 0–44)
AST: 24 U/L (ref 15–41)
Albumin: 3 g/dL — ABNORMAL LOW (ref 3.5–5.0)
Alkaline Phosphatase: 179 U/L — ABNORMAL HIGH (ref 38–126)
Anion gap: 8 (ref 5–15)
BUN: 32 mg/dL — ABNORMAL HIGH (ref 6–20)
CO2: 25 mmol/L (ref 22–32)
Calcium: 9.2 mg/dL (ref 8.9–10.3)
Chloride: 104 mmol/L (ref 98–111)
Creatinine: 1.75 mg/dL — ABNORMAL HIGH (ref 0.61–1.24)
GFR, Est AFR Am: 49 mL/min — ABNORMAL LOW (ref >60)
GFR, Estimated: 42 mL/min — ABNORMAL LOW (ref >60)
Glucose, Bld: 227 mg/dL — ABNORMAL HIGH (ref 70–99)
Potassium: 5.3 mmol/L — ABNORMAL HIGH (ref 3.5–5.1)
Sodium: 137 mmol/L (ref 135–145)
Total Bilirubin: 0.6 mg/dL (ref 0.3–1.2)
Total Protein: 6.1 g/dL — ABNORMAL LOW (ref 6.5–8.1)

## 2020-02-15 NOTE — Progress Notes (Signed)
Pharmacist Chemotherapy Monitoring - Follow Up Assessment    I verify that I have reviewed each item in the below checklist:  . Regimen for the patient is scheduled for the appropriate day and plan matches scheduled date. Marland Kitchen Appropriate non-routine labs are ordered dependent on drug ordered. . If applicable, additional medications reviewed and ordered per protocol based on lifetime cumulative doses and/or treatment regimen.   Plan for follow-up and/or issues identified: No . I-vent associated with next due treatment: No . MD and/or nursing notified: No   Kennith Center, Pharm.D., CPP 02/15/2020@1 :38 PM

## 2020-02-17 ENCOUNTER — Telehealth: Payer: Self-pay | Admitting: *Deleted

## 2020-02-17 NOTE — Telephone Encounter (Signed)
Received call from patient today stating that he had an episode of vomiting today. He stated it happened suddenly and then he felt better. Advised to use compazine for any hint of nausea and that he may use this every 6 hours if needed.  He also stated he fell yesterday after using the toilet. No injuries, no loss of consciousness.  He states he just felt weak for a few minutes.  Reinforced drinking fluids, eating small frequent meals as he is diabetic and needs to maintain his nutrition to avoid drops in blood sugar.  He voiced understanding. Encouraged to call back if nausea/vomiting becomes worse.  He is aware of his follow up appts.

## 2020-02-21 ENCOUNTER — Inpatient Hospital Stay: Payer: Medicare Other

## 2020-02-21 ENCOUNTER — Inpatient Hospital Stay (HOSPITAL_BASED_OUTPATIENT_CLINIC_OR_DEPARTMENT_OTHER): Payer: Medicare Other | Admitting: Internal Medicine

## 2020-02-21 ENCOUNTER — Other Ambulatory Visit: Payer: Self-pay | Admitting: Physician Assistant

## 2020-02-21 ENCOUNTER — Other Ambulatory Visit: Payer: Self-pay

## 2020-02-21 ENCOUNTER — Encounter: Payer: Self-pay | Admitting: Internal Medicine

## 2020-02-21 VITALS — BP 104/58 | HR 99 | Temp 98.9°F | Resp 19 | Ht 72.0 in | Wt 157.9 lb

## 2020-02-21 DIAGNOSIS — Z5112 Encounter for antineoplastic immunotherapy: Secondary | ICD-10-CM | POA: Diagnosis not present

## 2020-02-21 DIAGNOSIS — C3411 Malignant neoplasm of upper lobe, right bronchus or lung: Secondary | ICD-10-CM

## 2020-02-21 DIAGNOSIS — E11319 Type 2 diabetes mellitus with unspecified diabetic retinopathy without macular edema: Secondary | ICD-10-CM

## 2020-02-21 DIAGNOSIS — C3490 Malignant neoplasm of unspecified part of unspecified bronchus or lung: Secondary | ICD-10-CM

## 2020-02-21 DIAGNOSIS — I10 Essential (primary) hypertension: Secondary | ICD-10-CM | POA: Diagnosis not present

## 2020-02-21 DIAGNOSIS — Z5111 Encounter for antineoplastic chemotherapy: Secondary | ICD-10-CM | POA: Diagnosis not present

## 2020-02-21 LAB — CBC WITH DIFFERENTIAL (CANCER CENTER ONLY)
Abs Immature Granulocytes: 0.67 10*3/uL — ABNORMAL HIGH (ref 0.00–0.07)
Basophils Absolute: 0.1 10*3/uL (ref 0.0–0.1)
Basophils Relative: 0 %
Eosinophils Absolute: 0 10*3/uL (ref 0.0–0.5)
Eosinophils Relative: 0 %
HCT: 30.8 % — ABNORMAL LOW (ref 39.0–52.0)
Hemoglobin: 9.5 g/dL — ABNORMAL LOW (ref 13.0–17.0)
Immature Granulocytes: 3 %
Lymphocytes Relative: 10 %
Lymphs Abs: 2.2 10*3/uL (ref 0.7–4.0)
MCH: 31.6 pg (ref 26.0–34.0)
MCHC: 30.8 g/dL (ref 30.0–36.0)
MCV: 102.3 fL — ABNORMAL HIGH (ref 80.0–100.0)
Monocytes Absolute: 1.9 10*3/uL — ABNORMAL HIGH (ref 0.1–1.0)
Monocytes Relative: 9 %
Neutro Abs: 16.1 10*3/uL — ABNORMAL HIGH (ref 1.7–7.7)
Neutrophils Relative %: 78 %
Platelet Count: 287 10*3/uL (ref 150–400)
RBC: 3.01 MIL/uL — ABNORMAL LOW (ref 4.22–5.81)
RDW: 16 % — ABNORMAL HIGH (ref 11.5–15.5)
WBC Count: 21 10*3/uL — ABNORMAL HIGH (ref 4.0–10.5)
nRBC: 1.3 % — ABNORMAL HIGH (ref 0.0–0.2)

## 2020-02-21 LAB — CMP (CANCER CENTER ONLY)
ALT: 21 U/L (ref 0–44)
AST: 15 U/L (ref 15–41)
Albumin: 3 g/dL — ABNORMAL LOW (ref 3.5–5.0)
Alkaline Phosphatase: 141 U/L — ABNORMAL HIGH (ref 38–126)
Anion gap: 11 (ref 5–15)
BUN: 21 mg/dL — ABNORMAL HIGH (ref 6–20)
CO2: 22 mmol/L (ref 22–32)
Calcium: 8.9 mg/dL (ref 8.9–10.3)
Chloride: 105 mmol/L (ref 98–111)
Creatinine: 1.74 mg/dL — ABNORMAL HIGH (ref 0.61–1.24)
GFR, Est AFR Am: 49 mL/min — ABNORMAL LOW (ref >60)
GFR, Estimated: 43 mL/min — ABNORMAL LOW (ref >60)
Glucose, Bld: 368 mg/dL — ABNORMAL HIGH (ref 70–99)
Potassium: 5.5 mmol/L — ABNORMAL HIGH (ref 3.5–5.1)
Sodium: 138 mmol/L (ref 135–145)
Total Bilirubin: 0.4 mg/dL (ref 0.3–1.2)
Total Protein: 6.1 g/dL — ABNORMAL LOW (ref 6.5–8.1)

## 2020-02-21 LAB — TSH: TSH: 0.591 u[IU]/mL (ref 0.320–4.118)

## 2020-02-21 LAB — GLUCOSE, CAPILLARY: Glucose-Capillary: 241 mg/dL — ABNORMAL HIGH (ref 70–99)

## 2020-02-21 MED ORDER — SODIUM CHLORIDE 0.9 % IV SOLN
10.0000 mg | Freq: Once | INTRAVENOUS | Status: AC
  Administered 2020-02-21: 10 mg via INTRAVENOUS
  Filled 2020-02-21: qty 10

## 2020-02-21 MED ORDER — ONDANSETRON HCL 8 MG PO TABS
8.0000 mg | ORAL_TABLET | Freq: Three times a day (TID) | ORAL | 0 refills | Status: DC | PRN
Start: 2020-02-21 — End: 2020-07-25

## 2020-02-21 MED ORDER — SODIUM CHLORIDE 0.9 % IV SOLN
100.0000 mg/m2 | Freq: Once | INTRAVENOUS | Status: AC
  Administered 2020-02-21: 190 mg via INTRAVENOUS
  Filled 2020-02-21: qty 9.5

## 2020-02-21 MED ORDER — PALONOSETRON HCL INJECTION 0.25 MG/5ML
0.2500 mg | Freq: Once | INTRAVENOUS | Status: AC
  Administered 2020-02-21: 0.25 mg via INTRAVENOUS

## 2020-02-21 MED ORDER — SODIUM CHLORIDE 0.9 % IV SOLN
Freq: Once | INTRAVENOUS | Status: AC
  Filled 2020-02-21: qty 250

## 2020-02-21 MED ORDER — SODIUM CHLORIDE 0.9 % IV SOLN
1500.0000 mg | Freq: Once | INTRAVENOUS | Status: AC
  Administered 2020-02-21: 1500 mg via INTRAVENOUS
  Filled 2020-02-21: qty 30

## 2020-02-21 MED ORDER — SODIUM CHLORIDE 0.9 % IV SOLN
357.0000 mg | Freq: Once | INTRAVENOUS | Status: AC
  Administered 2020-02-21: 360 mg via INTRAVENOUS
  Filled 2020-02-21: qty 36

## 2020-02-21 MED ORDER — PALONOSETRON HCL INJECTION 0.25 MG/5ML
INTRAVENOUS | Status: AC
  Filled 2020-02-21: qty 5

## 2020-02-21 MED ORDER — INSULIN REGULAR HUMAN 100 UNIT/ML IJ SOLN
INTRAMUSCULAR | Status: AC
  Filled 2020-02-21: qty 1

## 2020-02-21 MED ORDER — SODIUM CHLORIDE 0.9 % IV SOLN
150.0000 mg | Freq: Once | INTRAVENOUS | Status: AC
  Administered 2020-02-21: 150 mg via INTRAVENOUS
  Filled 2020-02-21: qty 150

## 2020-02-21 MED ORDER — INSULIN REGULAR HUMAN 100 UNIT/ML IJ SOLN
8.0000 [IU] | Freq: Once | INTRAMUSCULAR | Status: AC
  Administered 2020-02-21: 8 [IU] via SUBCUTANEOUS

## 2020-02-21 NOTE — Progress Notes (Signed)
Printed copy of Lab results, AVS with schedule, written education on Hyperglycemia given to patient. Verbal education provided on signs and symptoms of hyperglycemia, blood sugar monitoring, diet and exercise provided. Patient verbalized understanding.

## 2020-02-21 NOTE — Patient Instructions (Addendum)
[facility] Discharge Instructions for Patients Receiving Chemotherapy  Today you received the following chemotherapy agents Durvalumab (IMFINZI), Carboplatin (PARAPLATIN) & Etoposide (VEPESID).  To help prevent nausea and vomiting after your treatment, we encourage you to take your nausea medication as prescribed.   If you develop nausea and vomiting that is not controlled by your nausea medication, call the clinic.   BELOW ARE SYMPTOMS THAT SHOULD BE REPORTED IMMEDIATELY:  *FEVER GREATER THAN 100.5 F  *CHILLS WITH OR WITHOUT FEVER  NAUSEA AND VOMITING THAT IS NOT CONTROLLED WITH YOUR NAUSEA MEDICATION  *UNUSUAL SHORTNESS OF BREATH  *UNUSUAL BRUISING OR BLEEDING  TENDERNESS IN MOUTH AND THROAT WITH OR WITHOUT PRESENCE OF ULCERS  *URINARY PROBLEMS  *BOWEL PROBLEMS  UNUSUAL RASH Items with * indicate a potential emergency and should be followed up as soon as possible.  Feel free to call the clinic should you have any questions or concerns. The clinic phone number is [phone].  Please show the [facility] at check-in to the Emergency Department and triage nurse.  Durvalumab injection What is this medicine? DURVALUMAB (dur VAL ue mab) is a monoclonal antibody. It is used to treat urothelial cancer and lung cancer. This medicine may be used for other purposes; ask your health care provider or pharmacist if you have questions. COMMON BRAND NAME(S): IMFINZI What should I tell my health care provider before I take this medicine? They need to know if you have any of these conditions:  diabetes  immune system problems  infection  inflammatory bowel disease  kidney disease  liver disease  lung or breathing disease  lupus  organ transplant  stomach or intestine problems  thyroid disease  an unusual or allergic reaction to durvalumab, other medicines, foods, dyes, or preservatives  pregnant or trying to get pregnant  breast-feeding How  should I use this medicine? This medicine is for infusion into a vein. It is given by a health care professional in a hospital or clinic setting. A special MedGuide will be given to you before each treatment. Be sure to read this information carefully each time. Talk to your pediatrician regarding the use of this medicine in children. Special care may be needed. Overdosage: If you think you have taken too much of this medicine contact a poison control center or emergency room at once. NOTE: This medicine is only for you. Do not share this medicine with others. What if I miss a dose? It is important not to miss your dose. Call your doctor or health care professional if you are unable to keep an appointment. What may interact with this medicine? Interactions have not been studied. This list may not describe all possible interactions. Give your health care provider a list of all the medicines, herbs, non-prescription drugs, or dietary supplements you use. Also tell them if you smoke, drink alcohol, or use illegal drugs. Some items may interact with your medicine. What should I watch for while using this medicine? This drug may make you feel generally unwell. Continue your course of treatment even though you feel ill unless your doctor tells you to stop. You may need blood work done while you are taking this medicine. Do not become pregnant while taking this medicine or for 3 months after stopping it. Women should inform their doctor if they wish to become pregnant or think they might be pregnant. There is a potential for serious side effects to an unborn child. Talk to your health care professional or pharmacist  for more information. Do not breast-feed an infant while taking this medicine or for 3 months after stopping it. What side effects may I notice from receiving this medicine? Side effects that you should report to your doctor or health care professional as soon as possible:  allergic reactions  like skin rash, itching or hives, swelling of the face, lips, or tongue  black, tarry stools  bloody or watery diarrhea  breathing problems  change in emotions or moods  change in sex drive  changes in vision  chest pain or chest tightness  chills  confusion  cough  facial flushing  fever  headache  signs and symptoms of high blood sugar such as dizziness; dry mouth; dry skin; fruity breath; nausea; stomach pain; increased hunger or thirst; increased urination  signs and symptoms of liver injury like dark yellow or brown urine; general ill feeling or flu-like symptoms; light-colored stools; loss of appetite; nausea; right upper belly pain; unusually weak or tired; yellowing of the eyes or skin  stomach pain  trouble passing urine or change in the amount of urine  weight gain or weight loss Side effects that usually do not require medical attention (report these to your doctor or health care professional if they continue or are bothersome):  bone pain  constipation  loss of appetite  muscle pain  nausea  swelling of the ankles, feet, hands  tiredness This list may not describe all possible side effects. Call your doctor for medical advice about side effects. You may report side effects to FDA at 1-800-FDA-1088. Where should I keep my medicine? This drug is given in a hospital or clinic and will not be stored at home. NOTE: This sheet is a summary. It may not cover all possible information. If you have questions about this medicine, talk to your doctor, pharmacist, or health care provider.  2020 Elsevier/Gold Standard (2016-12-09 19:25:04)  Carboplatin injection What is this medicine? CARBOPLATIN (KAR boe pla tin) is a chemotherapy drug. It targets fast dividing cells, like cancer cells, and causes these cells to die. This medicine is used to treat ovarian cancer and many other cancers. This medicine may be used for other purposes; ask your health care  provider or pharmacist if you have questions. COMMON BRAND NAME(S): Paraplatin What should I tell my health care provider before I take this medicine? They need to know if you have any of these conditions:  blood disorders  hearing problems  kidney disease  recent or ongoing radiation therapy  an unusual or allergic reaction to carboplatin, cisplatin, other chemotherapy, other medicines, foods, dyes, or preservatives  pregnant or trying to get pregnant  breast-feeding How should I use this medicine? This drug is usually given as an infusion into a vein. It is administered in a hospital or clinic by a specially trained health care professional. Talk to your pediatrician regarding the use of this medicine in children. Special care may be needed. Overdosage: If you think you have taken too much of this medicine contact a poison control center or emergency room at once. NOTE: This medicine is only for you. Do not share this medicine with others. What if I miss a dose? It is important not to miss a dose. Call your doctor or health care professional if you are unable to keep an appointment. What may interact with this medicine?  medicines for seizures  medicines to increase blood counts like filgrastim, pegfilgrastim, sargramostim  some antibiotics like amikacin, gentamicin, neomycin, streptomycin, tobramycin  vaccines Talk to your doctor or health care professional before taking any of these medicines:  acetaminophen  aspirin  ibuprofen  ketoprofen  naproxen This list may not describe all possible interactions. Give your health care provider a list of all the medicines, herbs, non-prescription drugs, or dietary supplements you use. Also tell them if you smoke, drink alcohol, or use illegal drugs. Some items may interact with your medicine. What should I watch for while using this medicine? Your condition will be monitored carefully while you are receiving this medicine. You  will need important blood work done while you are taking this medicine. This drug may make you feel generally unwell. This is not uncommon, as chemotherapy can affect healthy cells as well as cancer cells. Report any side effects. Continue your course of treatment even though you feel ill unless your doctor tells you to stop. In some cases, you may be given additional medicines to help with side effects. Follow all directions for their use. Call your doctor or health care professional for advice if you get a fever, chills or sore throat, or other symptoms of a cold or flu. Do not treat yourself. This drug decreases your body's ability to fight infections. Try to avoid being around people who are sick. This medicine may increase your risk to bruise or bleed. Call your doctor or health care professional if you notice any unusual bleeding. Be careful brushing and flossing your teeth or using a toothpick because you may get an infection or bleed more easily. If you have any dental work done, tell your dentist you are receiving this medicine. Avoid taking products that contain aspirin, acetaminophen, ibuprofen, naproxen, or ketoprofen unless instructed by your doctor. These medicines may hide a fever. Do not become pregnant while taking this medicine. Women should inform their doctor if they wish to become pregnant or think they might be pregnant. There is a potential for serious side effects to an unborn child. Talk to your health care professional or pharmacist for more information. Do not breast-feed an infant while taking this medicine. What side effects may I notice from receiving this medicine? Side effects that you should report to your doctor or health care professional as soon as possible:  allergic reactions like skin rash, itching or hives, swelling of the face, lips, or tongue  signs of infection - fever or chills, cough, sore throat, pain or difficulty passing urine  signs of decreased  platelets or bleeding - bruising, pinpoint red spots on the skin, black, tarry stools, nosebleeds  signs of decreased red blood cells - unusually weak or tired, fainting spells, lightheadedness  breathing problems  changes in hearing  changes in vision  chest pain  high blood pressure  low blood counts - This drug may decrease the number of white blood cells, red blood cells and platelets. You may be at increased risk for infections and bleeding.  nausea and vomiting  pain, swelling, redness or irritation at the injection site  pain, tingling, numbness in the hands or feet  problems with balance, talking, walking  trouble passing urine or change in the amount of urine Side effects that usually do not require medical attention (report to your doctor or health care professional if they continue or are bothersome):  hair loss  loss of appetite  metallic taste in the mouth or changes in taste This list may not describe all possible side effects. Call your doctor for medical advice about side effects. You may report side  effects to FDA at 1-800-FDA-1088. Where should I keep my medicine? This drug is given in a hospital or clinic and will not be stored at home. NOTE: This sheet is a summary. It may not cover all possible information. If you have questions about this medicine, talk to your doctor, pharmacist, or health care provider.  2020 Elsevier/Gold Standard (2008-01-04 14:38:05)  Etoposide, VP-16 injection What is this medicine? ETOPOSIDE, VP-16 (e toe POE side) is a chemotherapy drug. It is used to treat testicular cancer, lung cancer, and other cancers. This medicine may be used for other purposes; ask your health care provider or pharmacist if you have questions. COMMON BRAND NAME(S): Etopophos, Toposar, VePesid What should I tell my health care provider before I take this medicine? They need to know if you have any of these conditions:  infection  kidney  disease  liver disease  low blood counts, like low white cell, platelet, or red cell counts  an unusual or allergic reaction to etoposide, other medicines, foods, dyes, or preservatives  pregnant or trying to get pregnant  breast-feeding How should I use this medicine? This medicine is for infusion into a vein. It is administered in a hospital or clinic by a specially trained health care professional. Talk to your pediatrician regarding the use of this medicine in children. Special care may be needed. Overdosage: If you think you have taken too much of this medicine contact a poison control center or emergency room at once. NOTE: This medicine is only for you. Do not share this medicine with others. What if I miss a dose? It is important not to miss your dose. Call your doctor or health care professional if you are unable to keep an appointment. What may interact with this medicine? This medicine may interact with the following medications:  warfarin This list may not describe all possible interactions. Give your health care provider a list of all the medicines, herbs, non-prescription drugs, or dietary supplements you use. Also tell them if you smoke, drink alcohol, or use illegal drugs. Some items may interact with your medicine. What should I watch for while using this medicine? Visit your doctor for checks on your progress. This drug may make you feel generally unwell. This is not uncommon, as chemotherapy can affect healthy cells as well as cancer cells. Report any side effects. Continue your course of treatment even though you feel ill unless your doctor tells you to stop. In some cases, you may be given additional medicines to help with side effects. Follow all directions for their use. Call your doctor or health care professional for advice if you get a fever, chills or sore throat, or other symptoms of a cold or flu. Do not treat yourself. This drug decreases your body's ability to  fight infections. Try to avoid being around people who are sick. This medicine may increase your risk to bruise or bleed. Call your doctor or health care professional if you notice any unusual bleeding. Talk to your doctor about your risk of cancer. You may be more at risk for certain types of cancers if you take this medicine. Do not become pregnant while taking this medicine or for at least 6 months after stopping it. Women should inform their doctor if they wish to become pregnant or think they might be pregnant. Women of child-bearing potential will need to have a negative pregnancy test before starting this medicine. There is a potential for serious side effects to an unborn child. Talk  to your health care professional or pharmacist for more information. Do not breast-feed an infant while taking this medicine. Men must use a latex condom during sexual contact with a woman while taking this medicine and for at least 4 months after stopping it. A latex condom is needed even if you have had a vasectomy. Contact your doctor right away if your partner becomes pregnant. Do not donate sperm while taking this medicine and for at least 4 months after you stop taking this medicine. Men should inform their doctors if they wish to father a child. This medicine may lower sperm counts. What side effects may I notice from receiving this medicine? Side effects that you should report to your doctor or health care professional as soon as possible:  allergic reactions like skin rash, itching or hives, swelling of the face, lips, or tongue  low blood counts - this medicine may decrease the number of white blood cells, red blood cells, and platelets. You may be at increased risk for infections and bleeding  nausea, vomiting  redness, blistering, peeling or loosening of the skin, including inside the mouth  signs and symptoms of infection like fever; chills; cough; sore throat; pain or trouble passing urine  signs  and symptoms of low red blood cells or anemia such as unusually weak or tired; feeling faint or lightheaded; falls; breathing problems  unusual bruising or bleeding Side effects that usually do not require medical attention (report to your doctor or health care professional if they continue or are bothersome):  changes in taste  diarrhea  hair loss  loss of appetite  mouth sores This list may not describe all possible side effects. Call your doctor for medical advice about side effects. You may report side effects to FDA at 1-800-FDA-1088. Where should I keep my medicine? This drug is given in a hospital or clinic and will not be stored at home. NOTE: This sheet is a summary. It may not cover all possible information. If you have questions about this medicine, talk to your doctor, pharmacist, or health care provider.  2020 Elsevier/Gold Standard (2018-11-24 16:57:15)   Hyperglycemia Hyperglycemia is when the sugar (glucose) level in your blood is too high. It may not cause symptoms. If you do have symptoms, they may include warning signs, such as:  Feeling more thirsty than normal.  Hunger.  Feeling tired.  Needing to pee (urinate) more than normal.  Blurry eyesight (vision). You may get other symptoms as it gets worse, such as:  Dry mouth.  Not being hungry (loss of appetite).  Fruity-smelling breath.  Weakness.  Weight gain or loss that is not planned. Weight loss may be fast.  A tingling or numb feeling in your hands or feet.  Headache.  Skin that does not bounce back quickly when it is lightly pinched and released (poor skin turgor).  Pain in your belly (abdomen).  Cuts or bruises that heal slowly. High blood sugar can happen to people who do or do not have diabetes. High blood sugar can happen slowly or quickly, and it can be an emergency. Follow these instructions at home: General instructions  Take over-the-counter and prescription medicines only as  told by your doctor.  Do not use products that contain nicotine or tobacco, such as cigarettes and e-cigarettes. If you need help quitting, ask your doctor.  Limit alcohol intake to no more than 1 drink per day for nonpregnant women and 2 drinks per day for men. One drink equals 12  oz of beer, 5 oz of wine, or 1 oz of hard liquor.  Manage stress. If you need help with this, ask your doctor.  Keep all follow-up visits as told by your doctor. This is important. Eating and drinking   Stay at a healthy weight.  Exercise regularly, as told by your doctor.  Drink enough fluid, especially when you: ? Exercise. ? Get sick. ? Are in hot temperatures.  Eat healthy foods, such as: ? Low-fat (lean) proteins. ? Complex carbs (complex carbohydrates), such as whole wheat bread or brown rice. ? Fresh fruits and vegetables. ? Low-fat dairy products. ? Healthy fats.  Drink enough fluid to keep your pee (urine) clear or pale yellow. If you have diabetes:   Make sure you know the symptoms of hyperglycemia.  Follow your diabetes management plan, as told by your doctor. Make sure you: ? Take insulin and medicines as told. ? Follow your exercise plan. ? Follow your meal plan. Eat on time. Do not skip meals. ? Check your blood sugar as often as told. Make sure to check before and after exercise. If you exercise longer or in a different way than you normally do, check your blood sugar more often. ? Follow your sick day plan whenever you cannot eat or drink normally. Make this plan ahead of time with your doctor.  Share your diabetes management plan with people in your workplace, school, and household.  Check your urine for ketones when you are ill and as told by your doctor.  Carry a card or wear jewelry that says that you have diabetes. Contact a doctor if:  Your blood sugar level is higher than 240 mg/dL (13.3 mmol/L) for 2 days in a row.  You have problems keeping your blood sugar in  your target range.  High blood sugar happens often for you. Get help right away if:  You have trouble breathing.  You have a change in how you think, feel, or act (mental status).  You feel sick to your stomach (nauseous), and that feeling does not go away.  You cannot stop throwing up (vomiting). These symptoms may be an emergency. Do not wait to see if the symptoms will go away. Get medical help right away. Call your local emergency services (911 in the [place] Do not drive yourself to the hospital. Summary  Hyperglycemia is when the sugar (glucose) level in your blood is too high.  High blood sugar can happen to people who do or do not have diabetes.  Make sure you drink enough fluids, eat healthy foods, and exercise regularly.  Contact your doctor if you have problems keeping your blood sugar in your target range. This information is not intended to replace advice given to you by your health care provider. Make sure you discuss any questions you have with your health care provider. Document Revised: 06/16/2016 Document Reviewed: 06/16/2016 Elsevier Patient Education  [place].

## 2020-02-21 NOTE — Progress Notes (Signed)
[facility], [place], [place] notified.

## 2020-02-21 NOTE — Patient Instructions (Signed)
Durvalumab injection What is this medicine? DURVALUMAB (dur VAL ue mab) is a monoclonal antibody. It is used to treat urothelial cancer and lung cancer. This medicine may be used for other purposes; ask your health care provider or pharmacist if you have questions. COMMON BRAND NAME(S): IMFINZI What should I tell my health care provider before I take this medicine? They need to know if you have any of these conditions:  diabetes  immune system problems  infection  inflammatory bowel disease  kidney disease  liver disease  lung or breathing disease  lupus  organ transplant  stomach or intestine problems  thyroid disease  an unusual or allergic reaction to durvalumab, other medicines, foods, dyes, or preservatives  pregnant or trying to get pregnant  breast-feeding How should I use this medicine? This medicine is for infusion into a vein. It is given by a health care professional in a hospital or clinic setting. A special MedGuide will be given to you before each treatment. Be sure to read this information carefully each time. Talk to your pediatrician regarding the use of this medicine in children. Special care may be needed. Overdosage: If you think you have taken too much of this medicine contact a poison control center or emergency room at once. NOTE: This medicine is only for you. Do not share this medicine with others. What if I miss a dose? It is important not to miss your dose. Call your doctor or health care professional if you are unable to keep an appointment. What may interact with this medicine? Interactions have not been studied. This list may not describe all possible interactions. Give your health care provider a list of all the medicines, herbs, non-prescription drugs, or dietary supplements you use. Also tell them if you smoke, drink alcohol, or use illegal drugs. Some items may interact with your medicine. What should I watch for while using this  medicine? This drug may make you feel generally unwell. Continue your course of treatment even though you feel ill unless your doctor tells you to stop. You may need blood work done while you are taking this medicine. Do not become pregnant while taking this medicine or for 3 months after stopping it. Women should inform their doctor if they wish to become pregnant or think they might be pregnant. There is a potential for serious side effects to an unborn child. Talk to your health care professional or pharmacist for more information. Do not breast-feed an infant while taking this medicine or for 3 months after stopping it. What side effects may I notice from receiving this medicine? Side effects that you should report to your doctor or health care professional as soon as possible:  allergic reactions like skin rash, itching or hives, swelling of the face, lips, or tongue  black, tarry stools  bloody or watery diarrhea  breathing problems  change in emotions or moods  change in sex drive  changes in vision  chest pain or chest tightness  chills  confusion  cough  facial flushing  fever  headache  signs and symptoms of high blood sugar such as dizziness; dry mouth; dry skin; fruity breath; nausea; stomach pain; increased hunger or thirst; increased urination  signs and symptoms of liver injury like dark yellow or brown urine; general ill feeling or flu-like symptoms; light-colored stools; loss of appetite; nausea; right upper belly pain; unusually weak or tired; yellowing of the eyes or skin  stomach pain  trouble passing urine or change in  the amount of urine  weight gain or weight loss Side effects that usually do not require medical attention (report these to your doctor or health care professional if they continue or are bothersome):  bone pain  constipation  loss of appetite  muscle pain  nausea  swelling of the ankles, feet, hands  tiredness This list  may not describe all possible side effects. Call your doctor for medical advice about side effects. You may report side effects to FDA at 1-800-FDA-1088. Where should I keep my medicine? This drug is given in a hospital or clinic and will not be stored at home. NOTE: This sheet is a summary. It may not cover all possible information. If you have questions about this medicine, talk to your doctor, pharmacist, or health care provider.  2020 Elsevier/Gold Standard (2016-12-09 19:25:04) Etoposide, VP-16 injection What is this medicine? ETOPOSIDE, VP-16 (e toe POE side) is a chemotherapy drug. It is used to treat testicular cancer, lung cancer, and other cancers. This medicine may be used for other purposes; ask your health care provider or pharmacist if you have questions. COMMON BRAND NAME(S): Etopophos, Toposar, VePesid What should I tell my health care provider before I take this medicine? They need to know if you have any of these conditions:  infection  kidney disease  liver disease  low blood counts, like low white cell, platelet, or red cell counts  an unusual or allergic reaction to etoposide, other medicines, foods, dyes, or preservatives  pregnant or trying to get pregnant  breast-feeding How should I use this medicine? This medicine is for infusion into a vein. It is administered in a hospital or clinic by a specially trained health care professional. Talk to your pediatrician regarding the use of this medicine in children. Special care may be needed. Overdosage: If you think you have taken too much of this medicine contact a poison control center or emergency room at once. NOTE: This medicine is only for you. Do not share this medicine with others. What if I miss a dose? It is important not to miss your dose. Call your doctor or health care professional if you are unable to keep an appointment. What may interact with this medicine? This medicine may interact with the  following medications:  warfarin This list may not describe all possible interactions. Give your health care provider a list of all the medicines, herbs, non-prescription drugs, or dietary supplements you use. Also tell them if you smoke, drink alcohol, or use illegal drugs. Some items may interact with your medicine. What should I watch for while using this medicine? Visit your doctor for checks on your progress. This drug may make you feel generally unwell. This is not uncommon, as chemotherapy can affect healthy cells as well as cancer cells. Report any side effects. Continue your course of treatment even though you feel ill unless your doctor tells you to stop. In some cases, you may be given additional medicines to help with side effects. Follow all directions for their use. Call your doctor or health care professional for advice if you get a fever, chills or sore throat, or other symptoms of a cold or flu. Do not treat yourself. This drug decreases your body's ability to fight infections. Try to avoid being around people who are sick. This medicine may increase your risk to bruise or bleed. Call your doctor or health care professional if you notice any unusual bleeding. Talk to your doctor about your risk of cancer. You  may be more at risk for certain types of cancers if you take this medicine. Do not become pregnant while taking this medicine or for at least 6 months after stopping it. Women should inform their doctor if they wish to become pregnant or think they might be pregnant. Women of child-bearing potential will need to have a negative pregnancy test before starting this medicine. There is a potential for serious side effects to an unborn child. Talk to your health care professional or pharmacist for more information. Do not breast-feed an infant while taking this medicine. Men must use a latex condom during sexual contact with a woman while taking this medicine and for at least 4 months  after stopping it. A latex condom is needed even if you have had a vasectomy. Contact your doctor right away if your partner becomes pregnant. Do not donate sperm while taking this medicine and for at least 4 months after you stop taking this medicine. Men should inform their doctors if they wish to father a child. This medicine may lower sperm counts. What side effects may I notice from receiving this medicine? Side effects that you should report to your doctor or health care professional as soon as possible:  allergic reactions like skin rash, itching or hives, swelling of the face, lips, or tongue  low blood counts - this medicine may decrease the number of white blood cells, red blood cells, and platelets. You may be at increased risk for infections and bleeding  nausea, vomiting  redness, blistering, peeling or loosening of the skin, including inside the mouth  signs and symptoms of infection like fever; chills; cough; sore throat; pain or trouble passing urine  signs and symptoms of low red blood cells or anemia such as unusually weak or tired; feeling faint or lightheaded; falls; breathing problems  unusual bruising or bleeding Side effects that usually do not require medical attention (report to your doctor or health care professional if they continue or are bothersome):  changes in taste  diarrhea  hair loss  loss of appetite  mouth sores This list may not describe all possible side effects. Call your doctor for medical advice about side effects. You may report side effects to FDA at 1-800-FDA-1088. Where should I keep my medicine? This drug is given in a hospital or clinic and will not be stored at home. NOTE: This sheet is a summary. It may not cover all possible information. If you have questions about this medicine, talk to your doctor, pharmacist, or health care provider.  2020 Elsevier/Gold Standard (2018-11-24 16:57:15) Carboplatin injection What is this medicine?  CARBOPLATIN (KAR boe pla tin) is a chemotherapy drug. It targets fast dividing cells, like cancer cells, and causes these cells to die. This medicine is used to treat ovarian cancer and many other cancers. This medicine may be used for other purposes; ask your health care provider or pharmacist if you have questions. COMMON BRAND NAME(S): Paraplatin What should I tell my health care provider before I take this medicine? They need to know if you have any of these conditions:  blood disorders  hearing problems  kidney disease  recent or ongoing radiation therapy  an unusual or allergic reaction to carboplatin, cisplatin, other chemotherapy, other medicines, foods, dyes, or preservatives  pregnant or trying to get pregnant  breast-feeding How should I use this medicine? This drug is usually given as an infusion into a vein. It is administered in a hospital or clinic by a specially trained  health care professional. Talk to your pediatrician regarding the use of this medicine in children. Special care may be needed. Overdosage: If you think you have taken too much of this medicine contact a poison control center or emergency room at once. NOTE: This medicine is only for you. Do not share this medicine with others. What if I miss a dose? It is important not to miss a dose. Call your doctor or health care professional if you are unable to keep an appointment. What may interact with this medicine?  medicines for seizures  medicines to increase blood counts like filgrastim, pegfilgrastim, sargramostim  some antibiotics like amikacin, gentamicin, neomycin, streptomycin, tobramycin  vaccines Talk to your doctor or health care professional before taking any of these medicines:  acetaminophen  aspirin  ibuprofen  ketoprofen  naproxen This list may not describe all possible interactions. Give your health care provider a list of all the medicines, herbs, non-prescription drugs, or  dietary supplements you use. Also tell them if you smoke, drink alcohol, or use illegal drugs. Some items may interact with your medicine. What should I watch for while using this medicine? Your condition will be monitored carefully while you are receiving this medicine. You will need important blood work done while you are taking this medicine. This drug may make you feel generally unwell. This is not uncommon, as chemotherapy can affect healthy cells as well as cancer cells. Report any side effects. Continue your course of treatment even though you feel ill unless your doctor tells you to stop. In some cases, you may be given additional medicines to help with side effects. Follow all directions for their use. Call your doctor or health care professional for advice if you get a fever, chills or sore throat, or other symptoms of a cold or flu. Do not treat yourself. This drug decreases your body's ability to fight infections. Try to avoid being around people who are sick. This medicine may increase your risk to bruise or bleed. Call your doctor or health care professional if you notice any unusual bleeding. Be careful brushing and flossing your teeth or using a toothpick because you may get an infection or bleed more easily. If you have any dental work done, tell your dentist you are receiving this medicine. Avoid taking products that contain aspirin, acetaminophen, ibuprofen, naproxen, or ketoprofen unless instructed by your doctor. These medicines may hide a fever. Do not become pregnant while taking this medicine. Women should inform their doctor if they wish to become pregnant or think they might be pregnant. There is a potential for serious side effects to an unborn child. Talk to your health care professional or pharmacist for more information. Do not breast-feed an infant while taking this medicine. What side effects may I notice from receiving this medicine? Side effects that you should report to  your doctor or health care professional as soon as possible:  allergic reactions like skin rash, itching or hives, swelling of the face, lips, or tongue  signs of infection - fever or chills, cough, sore throat, pain or difficulty passing urine  signs of decreased platelets or bleeding - bruising, pinpoint red spots on the skin, black, tarry stools, nosebleeds  signs of decreased red blood cells - unusually weak or tired, fainting spells, lightheadedness  breathing problems  changes in hearing  changes in vision  chest pain  high blood pressure  low blood counts - This drug may decrease the number of white blood cells, red blood  cells and platelets. You may be at increased risk for infections and bleeding.  nausea and vomiting  pain, swelling, redness or irritation at the injection site  pain, tingling, numbness in the hands or feet  problems with balance, talking, walking  trouble passing urine or change in the amount of urine Side effects that usually do not require medical attention (report to your doctor or health care professional if they continue or are bothersome):  hair loss  loss of appetite  metallic taste in the mouth or changes in taste This list may not describe all possible side effects. Call your doctor for medical advice about side effects. You may report side effects to FDA at 1-800-FDA-1088. Where should I keep my medicine? This drug is given in a hospital or clinic and will not be stored at home. NOTE: This sheet is a summary. It may not cover all possible information. If you have questions about this medicine, talk to your doctor, pharmacist, or health care provider.  2020 Elsevier/Gold Standard (2008-01-04 14:38:05) Lung Cancer Lung cancer is an abnormal growth of cancerous cells that forms a mass (malignant tumor) in a lung. There are several types of lung cancer. The types are based on the appearance of the tumor cells. The two most common types  are:  Non-small cell lung cancer. This type of lung cancer is the most common type. Non-small cell lung cancers include squamous cell carcinoma, adenocarcinoma, and large cell carcinoma.  Small cell lung cancer. In this type of lung cancer, abnormal cells are smaller than those of non-small cell lung cancer. Small cell lung cancer gets worse (progresses) faster than non-small cell lung cancer. What are the causes? The most common cause of lung cancer is smoking tobacco. The second most common cause is exposure to a chemical called radon. What increases the risk? You are more likely to develop this condition if:  You smoke tobacco.  You have been exposed to: ? Secondhand tobacco smoke. ? Radon gas. ? Uranium. ? Asbestos. ? Arsenic in drinking water. ? Air pollution.  You have a family or personal history of lung cancer.  You have had lung radiation therapy in the past.  You are older than age 24. What are the signs or symptoms? In the early stages, you may not have any symptoms. As the cancer progresses, symptoms may include:  A lasting cough, possibly with blood.  Fatigue.  Unexplained weight loss.  Shortness of breath.  Loud breathing (wheezing).  Chest pain.  Loss of appetite. Symptoms of advanced lung cancer include:  Hoarseness.  Bone or joint pain.  Weakness.  Change in the structure of the fingernails (clubbing), so that the nail looks like an upside-down spoon.  Swelling of the face or arms.  Inability to move the face (paralysis).  Drooping eyelids. How is this diagnosed? This condition may be diagnosed based on:  Your symptoms and medical history.  A physical exam.  A chest X-ray.  A CT scan.  Blood tests.  Sputum tests.  Removal of a sample of lung tissue (lung biopsy) for testing. Your cancer will be assessed (staged) to determine how severe it is and how much it has spread (metastasized). How is this treated? Treatment depends on  the type and stage of your cancer. Treatment may include one or more of the following:  Surgery to remove as much of the cancer as possible. Lymph nodes in the area may be removed and tested for cancer as well.  Medicines that  kill cancer cells (chemotherapy).  High-energy rays that kill cancer cells (radiation therapy).  Chemotherapy. This treatment uses medicines to destroy cancer cells.  Targeted therapy. This targets specific parts of cancer cells and the area around them to block the growth and spread of the cancer. Targeted therapy can help limit the damage to healthy cells. Follow these instructions at home: Eating and drinking  Some of your treatments might affect your appetite. If you are having problems eating, or if you do not have an appetite, meet with a dietitian.  If you have side effects that affect your appetite, it may help to: ? Eat smaller meals and snacks often. ? Drink high-nutrition and high-calorie shakes or supplements. ? Eat bland and soft foods that are easy to eat. ? Avoid eating foods that are hot, spicy, or hard to swallow. General instructions   Do not use any products that contain nicotine or tobacco, such as cigarettes and e-cigarettes. If you need help quitting, ask your health care provider.  Do not drink alcohol.  If you are admitted to the hospital, make sure your cancer specialist (oncologist) is aware. Your cancer may affect your treatment for other conditions.  Take over-the-counter and prescription medicines only as told by your health care provider.  Consider joining a support group for people who have been diagnosed with lung cancer.  Work with your health care provider to manage any side effects of treatment.  Keep all follow-up visits as told by your health care provider. This is important. Where to find more information  American Cancer Society: https://www.cancer.[place]): https://www.cancer.gov  Contact a health care provider if you:  Lose weight without trying.  Have a persistent cough and wheezing.  Feel short of breath.  Get tired easily.  Have bone or joint pain.  Have difficulty swallowing.  Notice that your voice is changing or getting hoarse.  Have pain that does not get better with medicine. Get help right away if you:  Cough up blood.  Have new breathing problems.  Have chest pain.  Have a fever.  Have swelling in an ankle, leg, or arm, or the face or neck.  Have paralysis in your face.  Are very confused.  Have a drooping eyelid. Summary  Lung cancer is an abnormal growth of cancerous cells that forms a mass (malignant tumor) in a lung.  There are several types of lung cancer. The types are based on the appearance of the tumor cells. The two most common types are non-small cell and small cell.  The most common cause of lung cancer is smoking tobacco.  Early symptoms include a lasting cough, possibly with blood, and fatigue, unexplained weight loss, and shortness of breath.  After diagnosis, treatment depends on the type and stage of your cancer. This information is not intended to replace advice given to you by your health care provider. Make sure you discuss any questions you have with your health care provider. Document Revised: 09/11/2017 Document Reviewed: 08/06/2017 Elsevier Patient Education  2020 [employer].

## 2020-02-21 NOTE — Progress Notes (Signed)
[place] [phone]   [phone]  OFFICE PROGRESS NOTE  System, Pcp Not In No address on file  DIAGNOSIS: Extensive stage small cell lung cancer. He presented with a spiculated right middle lobe pulmonary nodule and mildly enlargedsolitary aortopulmonary lymph node. 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 He was diagnosed in March 2021.  PRIOR THERAPY: 1) Whole brain radiation under the care of Dr. Lisbeth Renshaw. Completed 01/17/2020  CURRENT THERAPY: Systemic chemotherapy withcarboplatin for an AUC of 5 on day 1,etoposide 100 mg/m on days 1, 2, and 3, and Imfinzi 1500 mg on day 1 IV every 3 weeks. First dose expected on4/20/21. Status post 1 cycle.  INTERVAL HISTORY: Dennis [name]. 59 y.o. male returns to the clinic today for follow-up visit.  The patient tolerated the first cycle of his treatment well except for fatigue and intermittent nausea.  He has been using Compazine on daily basis.  He denied having any current chest pain, shortness of breath, cough or hemoptysis.  He denied having any fever or chills.  He has no abdominal pain, diarrhea or constipation.  He has no significant weight loss or night sweats.  The patient is here today for evaluation before starting cycle #2 of his chemotherapy.   MEDICAL HISTORY: Past Medical History:  Diagnosis Date  . Alcohol abuse with alcohol-induced mood disorder ([place]) 08/18/2015  . Automatic implantable cardioverter-defibrillator in situ 2012   S/P St Jude Dual Chamber. ICD.  Marland Kitchen CAD (coronary artery disease) 11/16/2014   CAD Coronary angiography (12/2008) with mid LAD totally occluded and collateralized.  . Cardiac LV ejection fraction 10-20%   . Chest pain 09/04/2018  . CHF (congestive heart failure) ([place])   . Chronic systolic heart failure (HCC)    a) Mixed ICM/NICM b) RHC (05/2014): RA 2, RV 19/2/3, PA 22/14 (18), PCWP 6, Fick CO/CI: 5.2 / 2.7,  PVR 2.3 WU, PA 60% and 64% c) ECHO (05/2014): EF 20-25%, diff HK, akinesis entireanteroseptal myocardium, triv AI, mod MR, LA mod/sev dilated  . Cocaine abuse ([place]) 01/24/2015  . Cocaine abuse with cocaine-induced mood disorder ([place]) 08/20/2015  . Drug abuse and dependence ([place])   . Dysphagia following cerebral infarction 01/24/2015  . Embolic stroke involving right middle cerebral artery ([place])   . Extensive stage primary small cell carcinoma of lung ([place]) 01/04/2020  . H/O noncompliance with medical treatment, presenting hazards to health 01/24/2015  . Heart murmur   . High cholesterol   . Ischemic cardiomyopathy    a) Coronary angiography (12/2008) at [facility]: Lmain: nl, LAD mid 100% stenosis with left to left and right-to-left collaterals to the distal LAD; Lcx: nl, RCA nl.    . 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[place])   . MDD (major depressive disorder), recurrent severe, without psychosis ([place]) 08/18/2015  . Open wound of foot 02/27/2015  . Pneumonia 1988  . Psychoactive substance-induced mood disorder ([place]) 07/17/2015  . Sleep apnea    "cleared after T&A"  . Status post PICC central line placement   . Stroke ([place])   . Substance induced mood disorder ([place]) 08/17/2015  . Type II diabetes mellitus (HCC)     ALLERGIES:  has No Known Allergies.  MEDICATIONS:  Current Outpatient Medications  Medication Sig Dispense Refill  . Accu-Chek Softclix Lancets lancets     . atorvastatin (LIPITOR) 20 MG tablet TAKE 1 TABLET BY MOUTH DAILY AT 6 PM. (Patient taking differently: Take 20  mg by mouth daily at 6 PM. ) 30 tablet 6  . Blood Glucose Monitoring Suppl (FIFTY50 GLUCOSE METER 2.0) w/Device KIT Use as instructed    . carvedilol (COREG) 6.25 MG tablet Take 1.5 tablets (9.375 mg total) by mouth 2 (two) times daily with a meal. 90 tablet 3  . dexamethasone (DECADRON) 4 MG tablet Take 1 tablet (4 mg total) by mouth 2 (two) times daily. Please also instruct pt on taking omeprazole once daily while taking  steroids (Patient not taking: Reported on 02/07/2020) 60 tablet 0  . empagliflozin (JARDIANCE) 10 MG TABS tablet Take 10 mg by mouth daily before breakfast. 30 tablet 6  . Insulin Glargine (LANTUS SOLOSTAR) 100 UNIT/ML Solostar Pen Inject 36 Units into the skin daily at 10 pm.     . memantine (NAMENDA) 5 MG tablet Start on 1st day of radiation. Week 1: take one po daily. Week 2: take one po twice daily. Week 3: take two po qam, and one po q pm. Week 4: take 2 po twice daily for remaining 20 weeks. 630 tablet 0  . metFORMIN (GLUCOPHAGE-XR) 500 MG 24 hr tablet Take 1,000 mg by mouth daily with breakfast. Per [facility] pharmacist    . prochlorperazine (COMPAZINE) 10 MG tablet Take 1 tablet (10 mg total) by mouth every 6 (six) hours as needed. 30 tablet 2  . sacubitril-valsartan (ENTRESTO) 24-26 MG Take 1 tablet by mouth 2 (two) times daily. 60 tablet 3  . spironolactone (ALDACTONE) 25 MG tablet Take 1 tablet (25 mg total) by mouth daily. 90 tablet 3  . warfarin (COUMADIN) 7.5 MG tablet TAKE 1 TABLET BY MOUTH DAILY AT 6PM. 30 tablet 0   No current facility-administered medications for this visit.    SURGICAL HISTORY:  Past Surgical History:  Procedure Laterality Date  . BRONCHIAL BRUSHINGS  12/28/2019   Procedure: BRONCHIAL BRUSHINGS;  Surgeon: Garner Nash, DO;  Location: [facility] ENDOSCOPY;  Service: Thoracic;;  . BRONCHIAL NEEDLE ASPIRATION BIOPSY  12/28/2019   Procedure: BRONCHIAL NEEDLE ASPIRATION BIOPSIES;  Surgeon: Garner Nash, DO;  Location: [place];  Service: Thoracic;;  . BRONCHIAL WASHINGS  12/28/2019   Procedure: BRONCHIAL WASHINGS;  Surgeon: Garner Nash, DO;  Location: [place];  Service: Thoracic;;  . CARDIAC CATHETERIZATION  05/2014  . CARDIAC DEFIBRILLATOR PLACEMENT  03/2011  . CHOLECYSTECTOMY  1994  . FOREARM FRACTURE SURGERY Left 1980  . FRACTURE SURGERY    . RIGHT HEART CATHETERIZATION N/A 05/30/2014   Procedure: RIGHT HEART CATH;  Surgeon: Jolaine Artist, MD;   Location[facility] CATH LAB;  Service: Cardiovascular;  Laterality: N/A;  . RIGHT HEART CATHETERIZATION N/A 02/07/2015   Procedure: RIGHT HEART CATH;  Surgeon: Jolaine Artist, MD;  Location[facility] CATH LAB;  Service: Cardiovascular;  Laterality: N/A;  . TONSILLECTOMY AND ADENOIDECTOMY  ~ 1998  . VIDEO BRONCHOSCOPY WITH ENDOBRONCHIAL ULTRASOUND N/A 12/28/2019   Procedure: VIDEO BRONCHOSCOPY WITH ENDOBRONCHIAL ULTRASOUND;  Surgeon: Garner Nash, DO;  Location: [facility] ENDOSCOPY;  Service: Thoracic;  Laterality: N/A;    REVIEW OF SYSTEMS:  A comprehensive review of systems was negative except for: Constitutional: positive for fatigue Gastrointestinal: positive for nausea   PHYSICAL EXAMINATION: General appearance: alert, cooperative and no distress Head: Normocephalic, without obvious abnormality, atraumatic Neck: no adenopathy, no JVD, supple, symmetrical, trachea midline and thyroid not enlarged, symmetric, no tenderness/mass/nodules Lymph nodes: Cervical, supraclavicular, and axillary nodes normal. Resp: clear to auscultation bilaterally Back: symmetric, no curvature. ROM normal. No CVA tenderness. Cardio: regular  rate and rhythm, S1, S2 normal, no murmur, click, rub or gallop GI: soft, non-tender; bowel sounds normal; no masses,  no organomegaly Extremities: extremities normal, atraumatic, no cyanosis or edema  ECOG PERFORMANCE STATUS: 1 - Symptomatic but completely ambulatory  Blood pressure (!) 104/58, pulse 99, temperature 98.9 F (37.2 C), temperature source Temporal, resp. rate 19, height 6' (1.829 m), weight 157 lb 14.4 oz (71.6 kg), SpO2 99 %.  LABORATORY DATA: Lab Results  Component Value Date   WBC 29.0 (H) 02/14/2020   HGB 9.8 (L) 02/14/2020   HCT 32.1 (L) 02/14/2020   MCV 101.6 (H) 02/14/2020   PLT 170 02/14/2020      Chemistry      Component Value Date/Time   NA 137 02/14/2020 1151   K 5.3 (H) 02/14/2020 1151   CL 104 02/14/2020 1151   CO2 25 02/14/2020 1151   BUN 32  (H) 02/14/2020 1151   CREATININE 1.75 (H) 02/14/2020 1151      Component Value Date/Time   CALCIUM 9.2 02/14/2020 1151   ALKPHOS 179 (H) 02/14/2020 1151   AST 24 02/14/2020 1151   ALT 25 02/14/2020 1151   BILITOT 0.6 02/14/2020 1151       RADIOGRAPHIC STUDIES: No results found.  ASSESSMENT AND PLAN: This is a very pleasant 59 years old African-American male recently diagnosed with extensive stage small cell lung cancer presented with spiculated right middle lobe pulmonary nodule in addition to mediastinal lymphadenopathy and multiple metastatic brain lesions.  The patient is status post whole brain irradiation. He is currently undergoing systemic chemotherapy with carboplatin, etoposide and Imfinzi status post 1 cycle. The patient tolerated the first cycle of his treatment well except for the fatigue and intermittent nausea. I recommended for him to proceed with cycle #2 today as planned. I will arrange for the patient to come back for follow-up visit in 3 weeks for evaluation with repeat CT scan of the chest, abdomen pelvis for restaging of his disease. For the nausea I will add Zofran to his current regimen with Compazine and to be used as as needed. The patient was advised to call immediately if he has any concerning symptoms in the interval. The patient voices understanding of current disease status and treatment options and is in agreement with the current care plan.  All questions were answered. The patient knows to call the clinic with any problems, questions or concerns. We can certainly see the patient much sooner if necessary.  Disclaimer: This note was dictated with voice recognition software. Similar sounding words can inadvertently be transcribed and may not be corrected upon review.

## 2020-02-21 NOTE — Progress Notes (Signed)
Per Dr. Julien Nordmann: okay to treat patient with Scr of 1.74

## 2020-02-21 NOTE — Progress Notes (Signed)
Per Cassie, PA: Novolin insulin administered at 1417, will recheck Blood glucose in an hour.

## 2020-02-22 ENCOUNTER — Inpatient Hospital Stay: Payer: Medicare Other

## 2020-02-22 ENCOUNTER — Other Ambulatory Visit: Payer: Self-pay

## 2020-02-22 VITALS — BP 119/82 | HR 90 | Temp 98.0°F | Resp 20

## 2020-02-22 DIAGNOSIS — C3411 Malignant neoplasm of upper lobe, right bronchus or lung: Secondary | ICD-10-CM

## 2020-02-22 DIAGNOSIS — Z5112 Encounter for antineoplastic immunotherapy: Secondary | ICD-10-CM | POA: Diagnosis not present

## 2020-02-22 MED ORDER — SODIUM CHLORIDE 0.9 % IV SOLN
100.0000 mg/m2 | Freq: Once | INTRAVENOUS | Status: AC
  Administered 2020-02-22: 190 mg via INTRAVENOUS
  Filled 2020-02-22: qty 9.5

## 2020-02-22 MED ORDER — SODIUM CHLORIDE 0.9 % IV SOLN
10.0000 mg | Freq: Once | INTRAVENOUS | Status: AC
  Administered 2020-02-22: 10 mg via INTRAVENOUS
  Filled 2020-02-22: qty 10

## 2020-02-22 MED ORDER — SODIUM CHLORIDE 0.9 % IV SOLN
Freq: Once | INTRAVENOUS | Status: AC
  Filled 2020-02-22: qty 250

## 2020-02-22 NOTE — Patient Instructions (Signed)
[facility] Discharge Instructions for Patients Receiving Chemotherapy  Today you received the following chemotherapy agents: Etoposide (VEPESID).  To help prevent nausea and vomiting after your treatment, we encourage you to take your nausea medication as prescribed.   If you develop nausea and vomiting that is not controlled by your nausea medication, call the clinic.   BELOW ARE SYMPTOMS THAT SHOULD BE REPORTED IMMEDIATELY:  *FEVER GREATER THAN 100.5 F  *CHILLS WITH OR WITHOUT FEVER  NAUSEA AND VOMITING THAT IS NOT CONTROLLED WITH YOUR NAUSEA MEDICATION  *UNUSUAL SHORTNESS OF BREATH  *UNUSUAL BRUISING OR BLEEDING  TENDERNESS IN MOUTH AND THROAT WITH OR WITHOUT PRESENCE OF ULCERS  *URINARY PROBLEMS  *BOWEL PROBLEMS  UNUSUAL RASH Items with * indicate a potential emergency and should be followed up as soon as possible.  Feel free to call the clinic should you have any questions or concerns. The clinic phone number is [phone].  Please show the CHEMO ALERT CARD at check-in to the Emergency Department and triage nurse.   

## 2020-02-23 ENCOUNTER — Other Ambulatory Visit: Payer: Self-pay

## 2020-02-23 ENCOUNTER — Inpatient Hospital Stay: Payer: Medicare Other

## 2020-02-23 VITALS — BP 100/61 | HR 95 | Temp 98.0°F | Resp 18

## 2020-02-23 DIAGNOSIS — Z5112 Encounter for antineoplastic immunotherapy: Secondary | ICD-10-CM | POA: Diagnosis not present

## 2020-02-23 DIAGNOSIS — C3411 Malignant neoplasm of upper lobe, right bronchus or lung: Secondary | ICD-10-CM

## 2020-02-23 MED ORDER — SODIUM CHLORIDE 0.9 % IV SOLN
Freq: Once | INTRAVENOUS | Status: AC
  Filled 2020-02-23: qty 250

## 2020-02-23 MED ORDER — SODIUM CHLORIDE 0.9 % IV SOLN
10.0000 mg | Freq: Once | INTRAVENOUS | Status: AC
  Administered 2020-02-23: 10 mg via INTRAVENOUS
  Filled 2020-02-23: qty 10

## 2020-02-23 MED ORDER — SODIUM CHLORIDE 0.9 % IV SOLN
100.0000 mg/m2 | Freq: Once | INTRAVENOUS | Status: AC
  Administered 2020-02-23: 190 mg via INTRAVENOUS
  Filled 2020-02-23: qty 9.5

## 2020-02-23 NOTE — Patient Instructions (Signed)
[facility] Discharge Instructions for Patients Receiving Chemotherapy  Today you received the following chemotherapy agents: Etoposide (VEPESID).  To help prevent nausea and vomiting after your treatment, we encourage you to take your nausea medication as prescribed.   If you develop nausea and vomiting that is not controlled by your nausea medication, call the clinic.   BELOW ARE SYMPTOMS THAT SHOULD BE REPORTED IMMEDIATELY:  *FEVER GREATER THAN 100.5 F  *CHILLS WITH OR WITHOUT FEVER  NAUSEA AND VOMITING THAT IS NOT CONTROLLED WITH YOUR NAUSEA MEDICATION  *UNUSUAL SHORTNESS OF BREATH  *UNUSUAL BRUISING OR BLEEDING  TENDERNESS IN MOUTH AND THROAT WITH OR WITHOUT PRESENCE OF ULCERS  *URINARY PROBLEMS  *BOWEL PROBLEMS  UNUSUAL RASH Items with * indicate a potential emergency and should be followed up as soon as possible.  Feel free to call the clinic should you have any questions or concerns. The clinic phone number is [phone].  Please show the CHEMO ALERT CARD at check-in to the Emergency Department and triage nurse.   

## 2020-02-23 NOTE — Progress Notes (Signed)
  Radiation Oncology         [phone]) [phone] ________________________________  Name: Dennis [name]. MRN: [id number]  Date: 01/17/2020  DOB: 10-18-1960  End of Treatment Note  Diagnosis:   brain metastasis   Indication for treatment::  palliative       Radiation treatment dates:   01/04/2020 through 01/17/2020  Site/dose:   The patient was treated with a course of whole brain radiation therapy to a dose of 30 Gy in 10 fractions.  This was accomplished using a 2 field technique with additional reduced fields as necessary to improve dose homogeneity.  Narrative: The patient tolerated radiation treatment relatively well.   No unexpected difficulties in terms of acute toxicity.  The skin held up to treatment fairly well.  Plan: The patient has completed radiation treatment. The patient will return to radiation oncology clinic for routine followup in one month. I advised the patient to call or return sooner if they have any questions or concerns related to their recovery or treatment. ________________________________  Jodelle Gross, M.D., Ph.D.

## 2020-02-24 ENCOUNTER — Ambulatory Visit (INDEPENDENT_AMBULATORY_CARE_PROVIDER_SITE_OTHER): Payer: Medicare Other | Admitting: *Deleted

## 2020-02-24 DIAGNOSIS — Z7901 Long term (current) use of anticoagulants: Secondary | ICD-10-CM

## 2020-02-24 DIAGNOSIS — I428 Other cardiomyopathies: Secondary | ICD-10-CM | POA: Diagnosis not present

## 2020-02-24 LAB — POCT INR: INR: 1.4 — AB (ref 2.0–3.0)

## 2020-02-24 NOTE — Patient Instructions (Signed)
Description   Today take 1 tablet and tomorrow take 1.5 tablets then continue taking 1 tablet every day except 1/2 tablet on Mondays, Wednesdays, Fridays and Sundays. Recheck in 1 week.   Main # [phone] Coumadin Clinic # [phone].

## 2020-02-25 ENCOUNTER — Inpatient Hospital Stay: Payer: Medicare Other

## 2020-02-25 ENCOUNTER — Other Ambulatory Visit: Payer: Self-pay

## 2020-02-25 VITALS — BP 113/86 | HR 97 | Temp 98.6°F | Resp 18

## 2020-02-25 DIAGNOSIS — Z5112 Encounter for antineoplastic immunotherapy: Secondary | ICD-10-CM | POA: Diagnosis not present

## 2020-02-25 DIAGNOSIS — C3411 Malignant neoplasm of upper lobe, right bronchus or lung: Secondary | ICD-10-CM

## 2020-02-25 MED ORDER — PEGFILGRASTIM-CBQV 6 MG/0.6ML ~~LOC~~ SOSY
6.0000 mg | PREFILLED_SYRINGE | Freq: Once | SUBCUTANEOUS | Status: AC
  Administered 2020-02-25: 6 mg via SUBCUTANEOUS

## 2020-02-25 NOTE — Patient Instructions (Signed)

## 2020-02-28 ENCOUNTER — Other Ambulatory Visit: Payer: Self-pay

## 2020-02-28 ENCOUNTER — Telehealth: Payer: Self-pay | Admitting: *Deleted

## 2020-02-28 ENCOUNTER — Inpatient Hospital Stay: Payer: Medicare Other

## 2020-02-28 DIAGNOSIS — C3490 Malignant neoplasm of unspecified part of unspecified bronchus or lung: Secondary | ICD-10-CM

## 2020-02-28 DIAGNOSIS — Z5112 Encounter for antineoplastic immunotherapy: Secondary | ICD-10-CM | POA: Diagnosis not present

## 2020-02-28 LAB — CMP (CANCER CENTER ONLY)
ALT: 16 U/L (ref 0–44)
AST: 16 U/L (ref 15–41)
Albumin: 3.1 g/dL — ABNORMAL LOW (ref 3.5–5.0)
Alkaline Phosphatase: 237 U/L — ABNORMAL HIGH (ref 38–126)
Anion gap: 8 (ref 5–15)
BUN: 27 mg/dL — ABNORMAL HIGH (ref 6–20)
CO2: 25 mmol/L (ref 22–32)
Calcium: 9 mg/dL (ref 8.9–10.3)
Chloride: 104 mmol/L (ref 98–111)
Creatinine: 1.47 mg/dL — ABNORMAL HIGH (ref 0.61–1.24)
GFR, Est AFR Am: >60 mL/min (ref >60)
GFR, Estimated: 52 mL/min — ABNORMAL LOW (ref >60)
Glucose, Bld: 333 mg/dL — ABNORMAL HIGH (ref 70–99)
Potassium: 4.9 mmol/L (ref 3.5–5.1)
Sodium: 137 mmol/L (ref 135–145)
Total Bilirubin: <0.2 mg/dL — ABNORMAL LOW (ref 0.3–1.2)
Total Protein: 6 g/dL — ABNORMAL LOW (ref 6.5–8.1)

## 2020-02-28 LAB — CBC WITH DIFFERENTIAL (CANCER CENTER ONLY)
Abs Immature Granulocytes: 0 10*3/uL (ref 0.00–0.07)
Band Neutrophils: 2 %
Basophils Absolute: 0 10*3/uL (ref 0.0–0.1)
Basophils Relative: 0 %
Eosinophils Absolute: 0 10*3/uL (ref 0.0–0.5)
Eosinophils Relative: 0 %
HCT: 26.4 % — ABNORMAL LOW (ref 39.0–52.0)
Hemoglobin: 8.5 g/dL — ABNORMAL LOW (ref 13.0–17.0)
Lymphocytes Relative: 4 %
Lymphs Abs: 2.4 10*3/uL (ref 0.7–4.0)
MCH: 32.3 pg (ref 26.0–34.0)
MCHC: 32.2 g/dL (ref 30.0–36.0)
MCV: 100.4 fL — ABNORMAL HIGH (ref 80.0–100.0)
Monocytes Absolute: 1.8 10*3/uL — ABNORMAL HIGH (ref 0.1–1.0)
Monocytes Relative: 3 %
Neutro Abs: 54.9 10*3/uL — ABNORMAL HIGH (ref 1.7–7.7)
Neutrophils Relative %: 91 %
Platelet Count: 326 10*3/uL (ref 150–400)
RBC: 2.63 MIL/uL — ABNORMAL LOW (ref 4.22–5.81)
RDW: 15.9 % — ABNORMAL HIGH (ref 11.5–15.5)
WBC Count: 59 10*3/uL (ref 4.0–10.5)
nRBC: 0 % (ref 0.0–0.2)

## 2020-02-28 NOTE — Telephone Encounter (Signed)
Elevated WBC report called to [employer], [place].  Patient just had treatment and udenyca.   Per Dr. Julien Nordmann expected response.  No treatment changes.

## 2020-03-02 ENCOUNTER — Ambulatory Visit (INDEPENDENT_AMBULATORY_CARE_PROVIDER_SITE_OTHER): Payer: Medicare Other | Admitting: *Deleted

## 2020-03-02 ENCOUNTER — Other Ambulatory Visit: Payer: Self-pay

## 2020-03-02 DIAGNOSIS — I428 Other cardiomyopathies: Secondary | ICD-10-CM | POA: Diagnosis not present

## 2020-03-02 DIAGNOSIS — Z7901 Long term (current) use of anticoagulants: Secondary | ICD-10-CM

## 2020-03-02 LAB — POCT INR: INR: 1.8 — AB (ref 2.0–3.0)

## 2020-03-02 NOTE — Patient Instructions (Addendum)
Description   Today take 1 tablet then start taking 1 tablet every day except 1/2 tablet on Mondays, Wednesdays, and Fridays. Recheck in 2 weeks.  Main # [phone] Coumadin Clinic # [phone].

## 2020-03-06 ENCOUNTER — Other Ambulatory Visit: Payer: Self-pay

## 2020-03-06 ENCOUNTER — Inpatient Hospital Stay: Payer: Medicare Other

## 2020-03-06 DIAGNOSIS — Z5112 Encounter for antineoplastic immunotherapy: Secondary | ICD-10-CM | POA: Diagnosis not present

## 2020-03-06 DIAGNOSIS — C3490 Malignant neoplasm of unspecified part of unspecified bronchus or lung: Secondary | ICD-10-CM

## 2020-03-06 LAB — CMP (CANCER CENTER ONLY)
ALT: 28 U/L (ref 0–44)
AST: 24 U/L (ref 15–41)
Albumin: 3.6 g/dL (ref 3.5–5.0)
Alkaline Phosphatase: 231 U/L — ABNORMAL HIGH (ref 38–126)
Anion gap: 5 (ref 5–15)
BUN: 15 mg/dL (ref 6–20)
CO2: 29 mmol/L (ref 22–32)
Calcium: 9.3 mg/dL (ref 8.9–10.3)
Chloride: 103 mmol/L (ref 98–111)
Creatinine: 1.33 mg/dL — ABNORMAL HIGH (ref 0.61–1.24)
GFR, Est AFR Am: >60 mL/min
GFR, Estimated: 59 mL/min — ABNORMAL LOW
Glucose, Bld: 258 mg/dL — ABNORMAL HIGH (ref 70–99)
Potassium: 4.9 mmol/L (ref 3.5–5.1)
Sodium: 137 mmol/L (ref 135–145)
Total Bilirubin: 0.3 mg/dL (ref 0.3–1.2)
Total Protein: 7.1 g/dL (ref 6.5–8.1)

## 2020-03-06 LAB — CBC WITH DIFFERENTIAL (CANCER CENTER ONLY)
Abs Immature Granulocytes: 3.54 K/uL — ABNORMAL HIGH (ref 0.00–0.07)
Basophils Absolute: 0.2 K/uL — ABNORMAL HIGH (ref 0.0–0.1)
Basophils Relative: 1 %
Eosinophils Absolute: 0.2 K/uL (ref 0.0–0.5)
Eosinophils Relative: 1 %
HCT: 34.4 % — ABNORMAL LOW (ref 39.0–52.0)
Hemoglobin: 10.6 g/dL — ABNORMAL LOW (ref 13.0–17.0)
Immature Granulocytes: 13 %
Lymphocytes Relative: 13 %
Lymphs Abs: 3.5 K/uL (ref 0.7–4.0)
MCH: 32 pg (ref 26.0–34.0)
MCHC: 30.8 g/dL (ref 30.0–36.0)
MCV: 103.9 fL — ABNORMAL HIGH (ref 80.0–100.0)
Monocytes Absolute: 2.2 K/uL — ABNORMAL HIGH (ref 0.1–1.0)
Monocytes Relative: 8 %
Neutro Abs: 17.9 K/uL — ABNORMAL HIGH (ref 1.7–7.7)
Neutrophils Relative %: 64 %
Platelet Count: 142 K/uL — ABNORMAL LOW (ref 150–400)
RBC: 3.31 MIL/uL — ABNORMAL LOW (ref 4.22–5.81)
RDW: 17.3 % — ABNORMAL HIGH (ref 11.5–15.5)
WBC Count: 27.6 K/uL — ABNORMAL HIGH (ref 4.0–10.5)
nRBC: 3 % — ABNORMAL HIGH (ref 0.0–0.2)

## 2020-03-07 NOTE — Progress Notes (Signed)
Pharmacist Chemotherapy Monitoring - Follow Up Assessment    I verify that I have reviewed each item in the below checklist:  . Regimen for the patient is scheduled for the appropriate day and plan matches scheduled date. Marland Kitchen Appropriate non-routine labs are ordered dependent on drug ordered. . If applicable, additional medications reviewed and ordered per protocol based on lifetime cumulative doses and/or treatment regimen.   Plan for follow-up and/or issues identified: No . I-vent associated with next due treatment: No . MD and/or nursing notified: No  Dennis [name] 03/07/2020 10:58 AM

## 2020-03-13 ENCOUNTER — Encounter (HOSPITAL_COMMUNITY): Payer: Self-pay

## 2020-03-13 ENCOUNTER — Other Ambulatory Visit: Payer: Self-pay

## 2020-03-13 ENCOUNTER — Ambulatory Visit (HOSPITAL_COMMUNITY)
Admission: RE | Admit: 2020-03-13 | Discharge: 2020-03-13 | Disposition: A | Discharge status: Home / Self Care | Payer: Medicare Other | Source: Ambulatory Visit | Attending: Internal Medicine | Admitting: Internal Medicine

## 2020-03-13 DIAGNOSIS — C3490 Malignant neoplasm of unspecified part of unspecified bronchus or lung: Secondary | ICD-10-CM

## 2020-03-13 IMAGING — CT CT CHEST W/ CM
2 of 5 series · 12 of 36 positions shown, 15 images · IV contrast (OMNIPAQUE)
Comparison: Most recent CT abdomen and pelvis [DATE].
[DATE] PET-CT.

CLINICAL DATA: Primary Cancer Type: Lung
TECHNIQUE: Multidetector CT imaging of the chest, abdomen and pelvis was
performed following the standard protocol during bolus
administration of intravenous contrast.

CONTRAST:  100mL OMNIPAQUE IOHEXOL 300 MG/ML  SOLN

[Series 2: cap with · axial · 0.79mm/px · z∈[-542,-22]mm · 9 of 132 slices shown, 12 images]
[im 14/132  mediastinal]
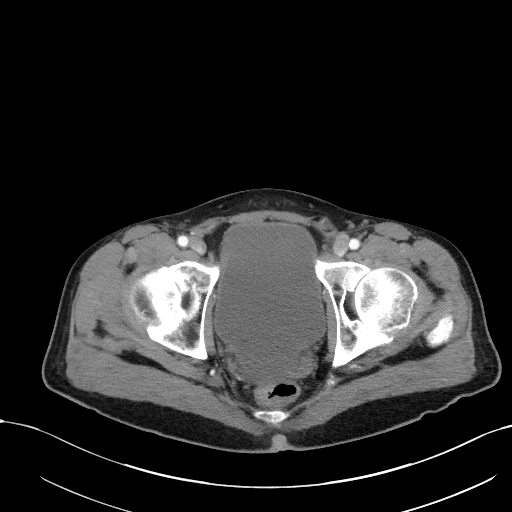
[im 14/132  lung]
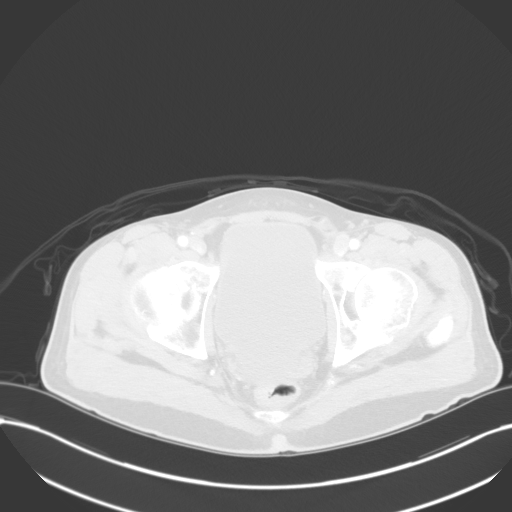
[im 27/132  lung]
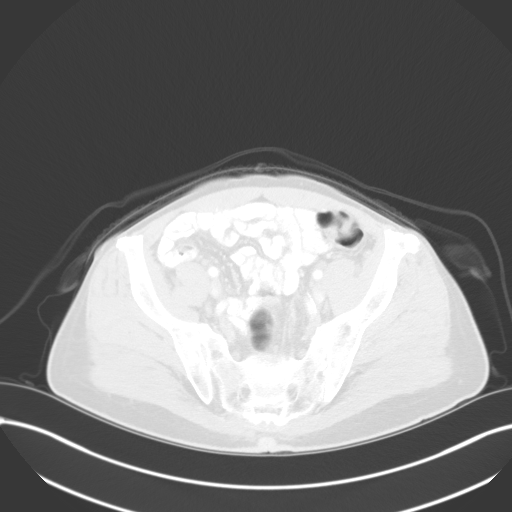
[im 40/132  lung]
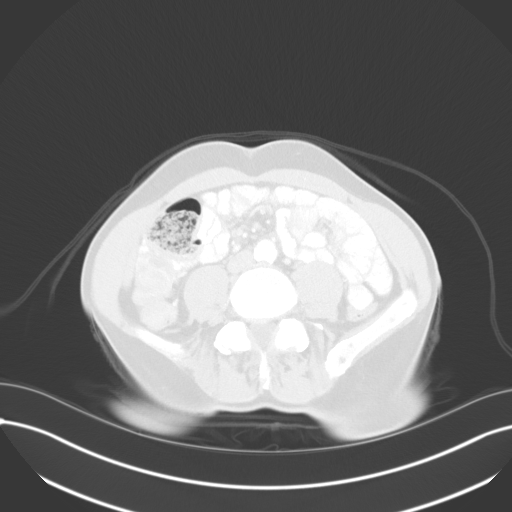
[im 53/132  lung]
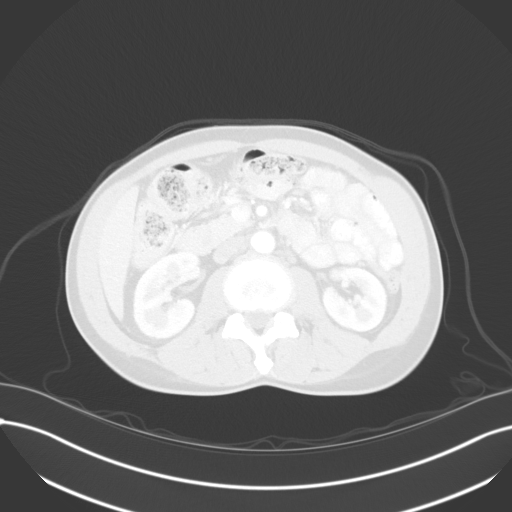
[im 66/132  mediastinal]
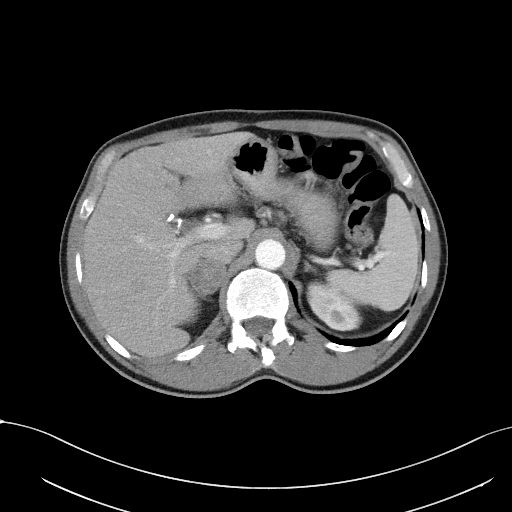
[im 66/132  lung]
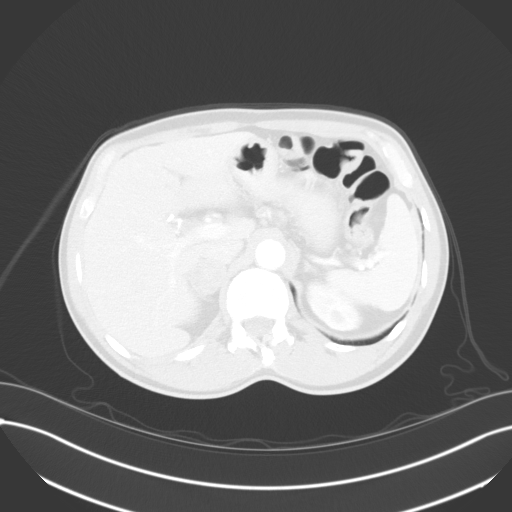
[im 79/132  lung]
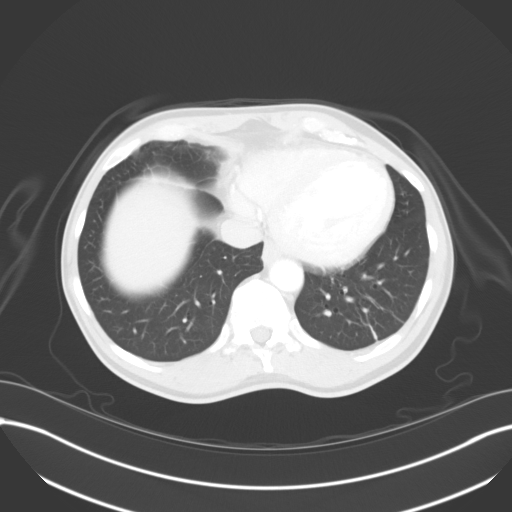
[im 92/132  lung]
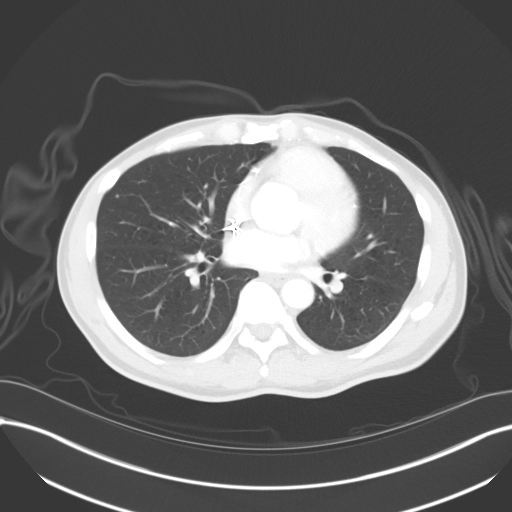
[im 105/132  lung]
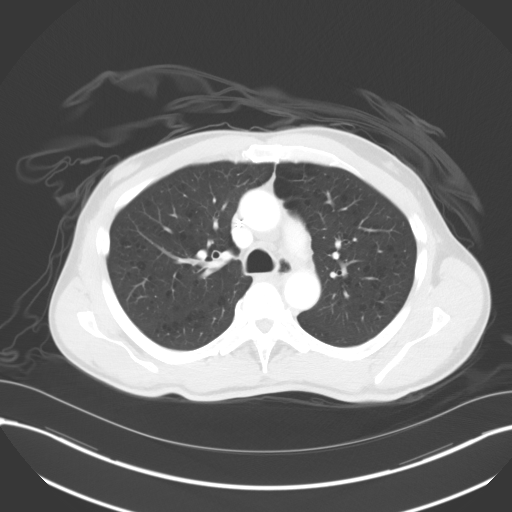
[im 118/132  mediastinal]
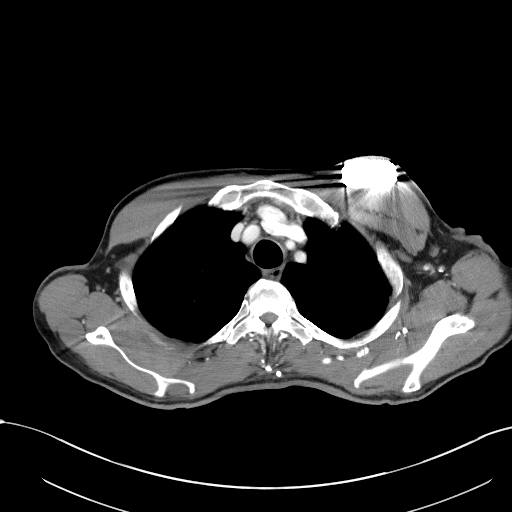
[im 118/132  lung]
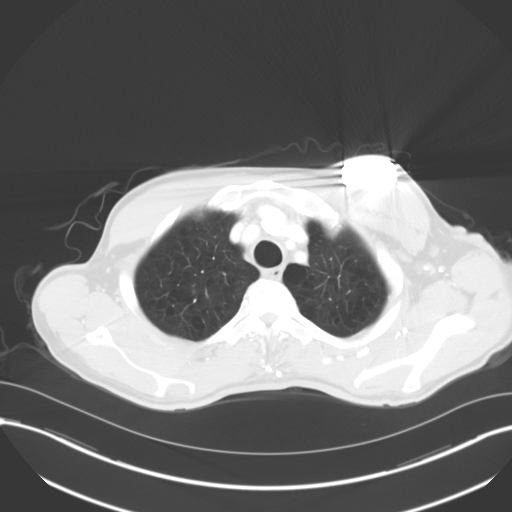

[Series 4: coronals · coronal · 1.00mm/px · 3 of 133 slices shown]
[im 27/133  lung]
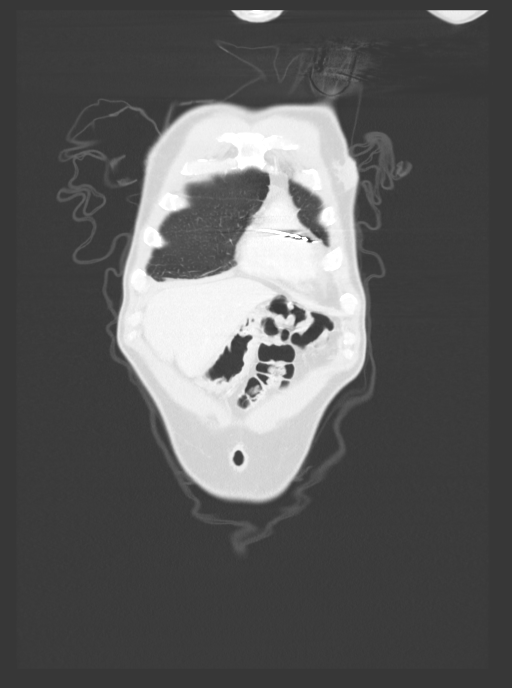
[im 53/133  lung]
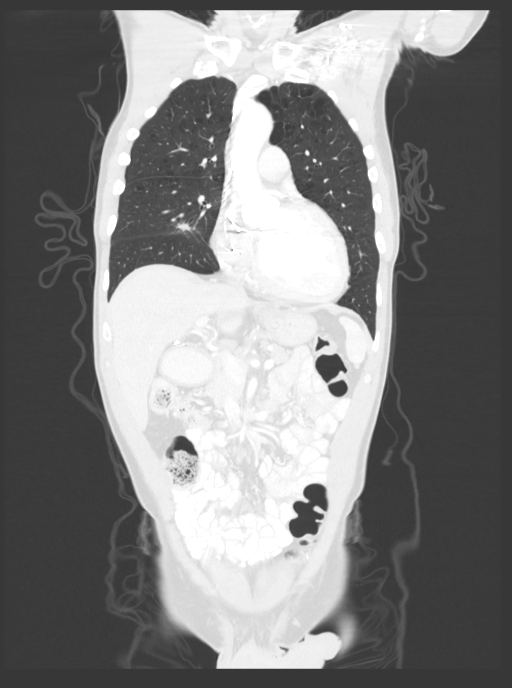
[im 80/133  lung]
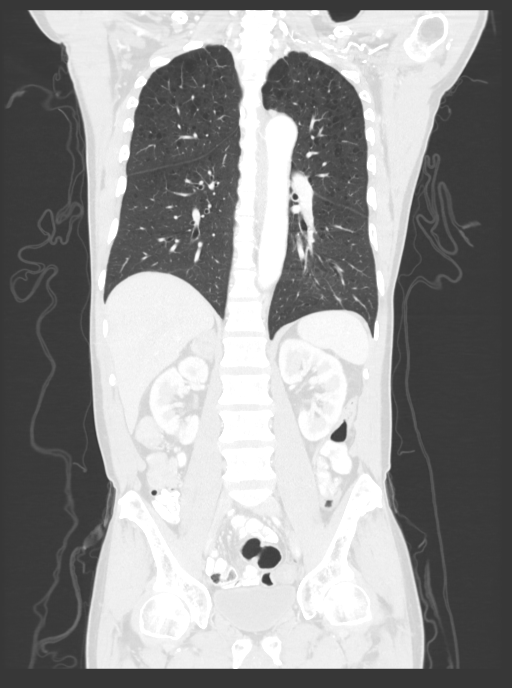

[12 of 36 positions shown; findings below may reference images not displayed]

Imaging Indication: Assess response to therapy

Interval therapy since last imaging? Yes

Initial Cancer Diagnosis

Date: [DATE]; established by: Biopsy-proven

Detailed Pathology: Extensive stage small cell lung cancer.

Primary Tumor location: Right middle lobe

Chemotherapy: Yes; Ongoing? Yes; Most recent administration:
[DATE]

Immunotherapy?  Yes; type: Imfnizi; Ongoing? Yes

Radiation therapy? Yes; Date Range: [DATE]; Target:
Brain

EXAM:
CT CHEST, ABDOMEN, AND PELVIS WITH CONTRAST
FINDINGS: CT CHEST FINDINGS

Cardiovascular: Top-normal heart size. Stable configuration of 2
lead left subclavian ICD with lead tips in the right atrium and
right ventricular apex. No significant pericardial
effusion/thickening. Left anterior descending and left circumflex
coronary atherosclerosis. Atherosclerotic nonaneurysmal thoracic
aorta. Normal caliber pulmonary arteries. No central pulmonary
emboli.

Mediastinum/Nodes: No discrete thyroid nodules. Unremarkable
esophagus. No axillary adenopathy. Previously mildly enlarged 1.1 cm
AP window node is decreased to 0.9 cm (series 2/image 27). No new or
residual pathologically enlarged mediastinal nodes. No
pathologically enlarged hilar lymph nodes.

Lungs/Pleura: No pneumothorax. No pleural effusion. Moderate
centrilobular and paraseptal emphysema with diffuse bronchial wall
thickening. Spiculated posterior right middle lobe 1.4 x 0.7 cm
pulmonary nodule (series 6/image 109), previously 1.5 x 0.9 cm,
slightly decreased. No acute consolidative airspace disease, lung
masses or new significant pulmonary nodules. Stable small
parenchymal band at the left lung base.

Musculoskeletal: No aggressive appearing focal osseous lesions.
Symmetric mild-to-moderate bilateral gynecomastia, stable.

CT ABDOMEN PELVIS FINDINGS

Hepatobiliary: Normal liver with no liver mass. Cholecystectomy. No
biliary ductal dilatation.

Pancreas: Normal, with no mass or duct dilation.

Spleen: Normal size. No mass.

Adrenals/Urinary Tract: Stable 3.1 cm right adrenal nodule,
characterized as an adenoma on recent PET-CT. Normal left adrenal.
No hydronephrosis. Subcentimeter hypodense upper right renal
cortical lesions are too small to characterize and are unchanged. No
new renal lesions. Normal bladder.

Stomach/Bowel: Normal non-distended stomach. Normal caliber small
bowel with no small bowel wall thickening. Normal appendix. Normal
large bowel with no diverticulosis, large bowel wall thickening or
pericolonic fat stranding.

Vascular/Lymphatic: Atherosclerotic nonaneurysmal abdominal aorta.
Patent portal, splenic, hepatic and renal veins. No pathologically
enlarged lymph nodes in the abdomen or pelvis.

Reproductive: Top-normal size prostate.

Other: No pneumoperitoneum, ascites or focal fluid collection.

Musculoskeletal: No aggressive appearing focal osseous lesions.
Moderate lower lumbar spondylosis.
IMPRESSION: 1. Spiculated right middle lobe pulmonary nodule is slightly
decreased.
2. AP window adenopathy is decreased.
3. No new or progressive metastatic disease in the chest, abdomen or
pelvis.
4. Aortic Atherosclerosis ([46]-[46]) and Emphysema ([46]-[46]).

## 2020-03-13 MED ORDER — IOHEXOL 9 MG/ML PO SOLN
ORAL | Status: AC
  Filled 2020-03-13: qty 1000

## 2020-03-13 MED ORDER — IOHEXOL 300 MG/ML  SOLN
100.0000 mL | Freq: Once | INTRAMUSCULAR | Status: AC | PRN
  Administered 2020-03-13: 100 mL via INTRAVENOUS

## 2020-03-13 MED ORDER — IOHEXOL 9 MG/ML PO SOLN
1000.0000 mL | ORAL | Status: AC
  Administered 2020-03-13: 1000 mL via ORAL

## 2020-03-13 MED ORDER — SODIUM CHLORIDE (PF) 0.9 % IJ SOLN
INTRAMUSCULAR | Status: AC
  Filled 2020-03-13: qty 50

## 2020-03-14 ENCOUNTER — Inpatient Hospital Stay: Payer: Medicare Other

## 2020-03-14 ENCOUNTER — Inpatient Hospital Stay: Payer: Medicare Other | Attending: Radiation Oncology | Admitting: Internal Medicine

## 2020-03-14 ENCOUNTER — Encounter: Payer: Self-pay | Admitting: *Deleted

## 2020-03-14 ENCOUNTER — Encounter: Payer: Self-pay | Admitting: Internal Medicine

## 2020-03-14 ENCOUNTER — Other Ambulatory Visit: Payer: Self-pay

## 2020-03-14 VITALS — BP 90/70 | HR 96 | Temp 97.9°F | Resp 19 | Ht 66.0 in | Wt 151.5 lb

## 2020-03-14 DIAGNOSIS — Z79899 Other long term (current) drug therapy: Secondary | ICD-10-CM | POA: Insufficient documentation

## 2020-03-14 DIAGNOSIS — C3490 Malignant neoplasm of unspecified part of unspecified bronchus or lung: Secondary | ICD-10-CM

## 2020-03-14 DIAGNOSIS — C3411 Malignant neoplasm of upper lobe, right bronchus or lung: Secondary | ICD-10-CM

## 2020-03-14 DIAGNOSIS — Z5112 Encounter for antineoplastic immunotherapy: Secondary | ICD-10-CM | POA: Diagnosis not present

## 2020-03-14 DIAGNOSIS — Z5111 Encounter for antineoplastic chemotherapy: Secondary | ICD-10-CM | POA: Insufficient documentation

## 2020-03-14 DIAGNOSIS — C342 Malignant neoplasm of middle lobe, bronchus or lung: Secondary | ICD-10-CM | POA: Insufficient documentation

## 2020-03-14 DIAGNOSIS — I10 Essential (primary) hypertension: Secondary | ICD-10-CM

## 2020-03-14 DIAGNOSIS — C7931 Secondary malignant neoplasm of brain: Secondary | ICD-10-CM | POA: Insufficient documentation

## 2020-03-14 DIAGNOSIS — Z5189 Encounter for other specified aftercare: Secondary | ICD-10-CM | POA: Diagnosis not present

## 2020-03-14 LAB — CBC WITH DIFFERENTIAL (CANCER CENTER ONLY)
Abs Immature Granulocytes: 0.1 10*3/uL — ABNORMAL HIGH (ref 0.00–0.07)
Basophils Absolute: 0.1 10*3/uL (ref 0.0–0.1)
Basophils Relative: 0 %
Eosinophils Absolute: 0.1 10*3/uL (ref 0.0–0.5)
Eosinophils Relative: 1 %
HCT: 32.7 % — ABNORMAL LOW (ref 39.0–52.0)
Hemoglobin: 10.4 g/dL — ABNORMAL LOW (ref 13.0–17.0)
Immature Granulocytes: 1 %
Lymphocytes Relative: 14 %
Lymphs Abs: 2 10*3/uL (ref 0.7–4.0)
MCH: 32.4 pg (ref 26.0–34.0)
MCHC: 31.8 g/dL (ref 30.0–36.0)
MCV: 101.9 fL — ABNORMAL HIGH (ref 80.0–100.0)
Monocytes Absolute: 1.4 10*3/uL — ABNORMAL HIGH (ref 0.1–1.0)
Monocytes Relative: 9 %
Neutro Abs: 11.1 10*3/uL — ABNORMAL HIGH (ref 1.7–7.7)
Neutrophils Relative %: 75 %
Platelet Count: 274 10*3/uL (ref 150–400)
RBC: 3.21 MIL/uL — ABNORMAL LOW (ref 4.22–5.81)
RDW: 17.1 % — ABNORMAL HIGH (ref 11.5–15.5)
WBC Count: 14.7 10*3/uL — ABNORMAL HIGH (ref 4.0–10.5)
nRBC: 0 % (ref 0.0–0.2)

## 2020-03-14 LAB — CMP (CANCER CENTER ONLY)
ALT: 19 U/L (ref 0–44)
AST: 13 U/L — ABNORMAL LOW (ref 15–41)
Albumin: 3.6 g/dL (ref 3.5–5.0)
Alkaline Phosphatase: 148 U/L — ABNORMAL HIGH (ref 38–126)
Anion gap: 11 (ref 5–15)
BUN: 20 mg/dL (ref 6–20)
CO2: 24 mmol/L (ref 22–32)
Calcium: 9.5 mg/dL (ref 8.9–10.3)
Chloride: 104 mmol/L (ref 98–111)
Creatinine: 1.53 mg/dL — ABNORMAL HIGH (ref 0.61–1.24)
GFR, Est AFR Am: 58 mL/min — ABNORMAL LOW (ref >60)
GFR, Estimated: 50 mL/min — ABNORMAL LOW (ref >60)
Glucose, Bld: 193 mg/dL — ABNORMAL HIGH (ref 70–99)
Potassium: 4.4 mmol/L (ref 3.5–5.1)
Sodium: 139 mmol/L (ref 135–145)
Total Bilirubin: 0.3 mg/dL (ref 0.3–1.2)
Total Protein: 6.7 g/dL (ref 6.5–8.1)

## 2020-03-14 LAB — TSH: TSH: 1.046 u[IU]/mL (ref 0.350–4.500)

## 2020-03-14 MED ORDER — PALONOSETRON HCL INJECTION 0.25 MG/5ML
INTRAVENOUS | Status: AC
  Filled 2020-03-14: qty 5

## 2020-03-14 MED ORDER — SODIUM CHLORIDE 0.9 % IV SOLN
1500.0000 mg | Freq: Once | INTRAVENOUS | Status: AC
  Administered 2020-03-14: 1500 mg via INTRAVENOUS
  Filled 2020-03-14: qty 30

## 2020-03-14 MED ORDER — SODIUM CHLORIDE 0.9 % IV SOLN
100.0000 mg/m2 | Freq: Once | INTRAVENOUS | Status: AC
  Administered 2020-03-14: 190 mg via INTRAVENOUS
  Filled 2020-03-14: qty 9.5

## 2020-03-14 MED ORDER — SODIUM CHLORIDE 0.9 % IV SOLN
150.0000 mg | Freq: Once | INTRAVENOUS | Status: AC
  Administered 2020-03-14: 150 mg via INTRAVENOUS
  Filled 2020-03-14: qty 150

## 2020-03-14 MED ORDER — SODIUM CHLORIDE 0.9 % IV SOLN
Freq: Once | INTRAVENOUS | Status: AC
  Filled 2020-03-14: qty 250

## 2020-03-14 MED ORDER — SODIUM CHLORIDE 0.9 % IV SOLN
390.0000 mg | Freq: Once | INTRAVENOUS | Status: AC
  Administered 2020-03-14: 390 mg via INTRAVENOUS
  Filled 2020-03-14: qty 39

## 2020-03-14 MED ORDER — SODIUM CHLORIDE 0.9 % IV SOLN
10.0000 mg | Freq: Once | INTRAVENOUS | Status: AC
  Administered 2020-03-14: 10 mg via INTRAVENOUS
  Filled 2020-03-14: qty 10

## 2020-03-14 MED ORDER — PALONOSETRON HCL INJECTION 0.25 MG/5ML
0.2500 mg | Freq: Once | INTRAVENOUS | Status: AC
  Administered 2020-03-14: 0.25 mg via INTRAVENOUS

## 2020-03-14 NOTE — Progress Notes (Signed)
    [facility] [phone]   [phone]  OFFICE PROGRESS NOTE  System, Pcp Not In No address on file  DIAGNOSIS: Extensive stage small cell lung cancer. He presented with a spiculated right middle lobe pulmonary nodule and mildly enlargedsolitary aortopulmonary lymph node. 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 He was diagnosed in March 2021.  PRIOR THERAPY: 1) Whole brain radiation under the care of Dr. Moody. Completed 01/17/2020  CURRENT THERAPY: Systemic chemotherapy withcarboplatin for an AUC of 5 on day 1,etoposide 100 mg/m on days 1, 2, and 3, and Imfinzi 1500 mg on day 1 IV every 3 weeks. First dose expected on4/20/21. Status post 2 cycles.  INTERVAL HISTORY: Dennis [name]. 58 y.o. male returns to the clinic today for follow-up visit.  The patient is feeling fine today with no concerning complaints except for occasional nausea once a week.  It is mainly has dry heaves.  He denied having any current chest pain, shortness of breath, cough or hemoptysis.  He denied having any vomiting, diarrhea, abdominal pain or constipation.  He lost few pounds since his last visit.  The patient denied having any recent headache or visual changes.  He has been tolerating his systemic chemotherapy fairly well.  He is here today for evaluation with repeat CT scan of the chest, abdomen and pelvis for restaging of his disease.  MEDICAL HISTORY: Past Medical History:  Diagnosis Date  . Alcohol abuse with alcohol-induced mood disorder (HCC) 08/18/2015  . Automatic implantable cardioverter-defibrillator in situ 2012   S/P St Jude Dual Chamber. ICD.  . CAD (coronary artery disease) 11/16/2014   CAD Coronary angiography (12/2008) with mid LAD totally occluded and collateralized.  . Cardiac LV ejection fraction 10-20%   . Chest pain 09/04/2018  . CHF (congestive heart failure) (HCC)   . Chronic systolic heart  failure (HCC)    a) Mixed ICM/NICM b) RHC (05/2014): RA 2, RV 19/2/3, PA 22/14 (18), PCWP 6, Fick CO/CI: 5.2 / 2.7, PVR 2.3 WU, PA 60% and 64% c) ECHO (05/2014): EF 20-25%, diff HK, akinesis entireanteroseptal myocardium, triv AI, mod MR, LA mod/sev dilated  . Cocaine abuse (HCC) 01/24/2015  . Cocaine abuse with cocaine-induced mood disorder (HCC) 08/20/2015  . Drug abuse and dependence (HCC)   . Dysphagia following cerebral infarction 01/24/2015  . Embolic stroke involving right middle cerebral artery (HCC)   . Extensive stage primary small cell carcinoma of lung (HCC) 01/04/2020  . H/O noncompliance with medical treatment, presenting hazards to health 01/24/2015  . Heart murmur   . High cholesterol   . Ischemic cardiomyopathy    a) Coronary angiography (12/2008) at [facility]: Lmain: nl, LAD mid 100% stenosis with left to left and right-to-left collaterals to the distal LAD; Lcx: nl, RCA nl.    . 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HCC)   . MDD (major depressive disorder), recurrent severe, without psychosis (HCC) 08/18/2015  . Open wound of foot 02/27/2015  . Pneumonia 1988  . Psychoactive substance-induced mood disorder (HCC) 07/17/2015  . Sleep apnea    "cleared after T&A"  . Status post PICC central line placement   . Stroke (HCC)   . Substance induced mood disorder (HCC) 08/17/2015  . Type II diabetes mellitus (HCC)     ALLERGIES:  has No Known Allergies.  MEDICATIONS:  Current Outpatient Medications  Medication Sig Dispense Refill  . Accu-Chek Softclix Lancets lancets     .   atorvastatin (LIPITOR) 20 MG tablet TAKE 1 TABLET BY MOUTH DAILY AT 6 PM. (Patient taking differently: Take 20 mg by mouth daily at 6 PM. ) 30 tablet 6  . Blood Glucose Monitoring Suppl (FIFTY50 GLUCOSE METER 2.0) w/Device KIT Use as instructed    . carvedilol (COREG) 6.25 MG tablet Take 1.5 tablets (9.375 mg total) by mouth 2 (two) times daily with a meal. 90 tablet 3  . dexamethasone (DECADRON) 4 MG tablet Take 1 tablet  (4 mg total) by mouth 2 (two) times daily. Please also instruct pt on taking omeprazole once daily while taking steroids (Patient not taking: Reported on 02/07/2020) 60 tablet 0  . empagliflozin (JARDIANCE) 10 MG TABS tablet Take 10 mg by mouth daily before breakfast. 30 tablet 6  . Insulin Glargine (LANTUS SOLOSTAR) 100 UNIT/ML Solostar Pen Inject 36 Units into the skin daily at 10 pm.     . INSUPEN ULTRAFIN 31G X 8 MM MISC     . memantine (NAMENDA) 5 MG tablet Start on 1st day of radiation. Week 1: take one po daily. Week 2: take one po twice daily. Week 3: take two po qam, and one po q pm. Week 4: take 2 po twice daily for remaining 20 weeks. 630 tablet 0  . metFORMIN (GLUCOPHAGE-XR) 500 MG 24 hr tablet Take 1,000 mg by mouth daily with breakfast. Per [facility] pharmacist    . ondansetron (ZOFRAN) 8 MG tablet Take 1 tablet (8 mg total) by mouth every 8 (eight) hours as needed for nausea or vomiting. 20 tablet 0  . pantoprazole (PROTONIX) 40 MG tablet Take 1 tablet by mouth daily.    . prochlorperazine (COMPAZINE) 10 MG tablet Take 1 tablet (10 mg total) by mouth every 6 (six) hours as needed. 30 tablet 2  . sacubitril-valsartan (ENTRESTO) 24-26 MG Take 1 tablet by mouth 2 (two) times daily. 60 tablet 3  . spironolactone (ALDACTONE) 25 MG tablet Take 1 tablet (25 mg total) by mouth daily. 90 tablet 3  . warfarin (COUMADIN) 7.5 MG tablet TAKE 1 TABLET BY MOUTH DAILY AT 6PM. 30 tablet 0   No current facility-administered medications for this visit.    SURGICAL HISTORY:  Past Surgical History:  Procedure Laterality Date  . BRONCHIAL BRUSHINGS  12/28/2019   Procedure: BRONCHIAL BRUSHINGS;  Surgeon: Icard, Bradley L, DO;  Location: [facility];  Service: Thoracic;;  . BRONCHIAL NEEDLE ASPIRATION BIOPSY  12/28/2019   Procedure: BRONCHIAL NEEDLE ASPIRATION BIOPSIES;  Surgeon: Icard, Bradley L, DO;  Location: [facility];  Service: Thoracic;;  . BRONCHIAL WASHINGS  12/28/2019   Procedure: BRONCHIAL  WASHINGS;  Surgeon: Icard, Bradley L, DO;  Location: [facility];  Service: Thoracic;;  . CARDIAC CATHETERIZATION  05/2014  . CARDIAC DEFIBRILLATOR PLACEMENT  03/2011  . CHOLECYSTECTOMY  1994  . FOREARM FRACTURE SURGERY Left 1980  . FRACTURE SURGERY    . RIGHT HEART CATHETERIZATION N/A 05/30/2014   Procedure: RIGHT HEART CATH;  Surgeon: Daniel R Bensimhon, MD;  Location: [facility];  Service: Cardiovascular;  Laterality: N/A;  . RIGHT HEART CATHETERIZATION N/A 02/07/2015   Procedure: RIGHT HEART CATH;  Surgeon: Daniel R Bensimhon, MD;  Location: [facility];  Service: Cardiovascular;  Laterality: N/A;  . TONSILLECTOMY AND ADENOIDECTOMY  ~ 1998  . VIDEO BRONCHOSCOPY WITH ENDOBRONCHIAL ULTRASOUND N/A 12/28/2019   Procedure: VIDEO BRONCHOSCOPY WITH ENDOBRONCHIAL ULTRASOUND;  Surgeon: Icard, Bradley L, DO;  Location: [facility];  Service: Thoracic;  Laterality: N/A;    REVIEW OF SYSTEMS:    Constitutional: positive for fatigue and weight loss Eyes: negative Ears, nose, mouth, throat, and face: negative Respiratory: negative Cardiovascular: negative Gastrointestinal: positive for nausea Genitourinary:negative Integument/breast: negative Hematologic/lymphatic: negative Musculoskeletal:negative Neurological: negative Behavioral/Psych: negative Endocrine: negative Allergic/Immunologic: negative   PHYSICAL EXAMINATION: General appearance: alert, cooperative and no distress Head: Normocephalic, without obvious abnormality, atraumatic Neck: no adenopathy, no JVD, supple, symmetrical, trachea midline and thyroid not enlarged, symmetric, no tenderness/mass/nodules Lymph nodes: Cervical, supraclavicular, and axillary nodes normal. Resp: clear to auscultation bilaterally Back: symmetric, no curvature. ROM normal. No CVA tenderness. Cardio: regular rate and rhythm, S1, S2 normal, no murmur, click, rub or gallop GI: soft, non-tender; bowel sounds normal; no masses,  no organomegaly Extremities:  extremities normal, atraumatic, no cyanosis or edema Neurologic: Alert and oriented X 3, normal strength and tone. Normal symmetric reflexes. Normal coordination and gait  ECOG PERFORMANCE STATUS: 1 - Symptomatic but completely ambulatory  Blood pressure 90/70, pulse 96, temperature 97.9 F (36.6 C), temperature source Temporal, resp. rate 19, height 5' 6" (1.676 m), weight 151 lb 8 oz (68.7 kg), SpO2 100 %.  LABORATORY DATA: Lab Results  Component Value Date   WBC 14.7 (H) 03/14/2020   HGB 10.4 (L) 03/14/2020   HCT 32.7 (L) 03/14/2020   MCV 101.9 (H) 03/14/2020   PLT 274 03/14/2020      Chemistry      Component Value Date/Time   NA 137 03/06/2020 1115   K 4.9 03/06/2020 1115   CL 103 03/06/2020 1115   CO2 29 03/06/2020 1115   BUN 15 03/06/2020 1115   CREATININE 1.33 (H) 03/06/2020 1115      Component Value Date/Time   CALCIUM 9.3 03/06/2020 1115   ALKPHOS 231 (H) 03/06/2020 1115   AST 24 03/06/2020 1115   ALT 28 03/06/2020 1115   BILITOT 0.3 03/06/2020 1115       RADIOGRAPHIC STUDIES: CT Chest W Contrast  Result Date: 03/13/2020 CLINICAL DATA:  Primary Cancer Type: Lung Imaging Indication: Assess response to therapy Interval therapy since last imaging? Yes Initial Cancer Diagnosis Date: 12/28/2019; established by: Biopsy-proven Detailed Pathology: Extensive stage small cell lung cancer. Primary Tumor location: Right middle lobe Chemotherapy: Yes; Ongoing? Yes; Most recent administration: 02/21/2020 Immunotherapy?  Yes; type: Imfnizi; Ongoing? Yes Radiation therapy? Yes; Date Range: 01/04/2020-01/17/2020; Target: Brain EXAM: CT CHEST, ABDOMEN, AND PELVIS WITH CONTRAST TECHNIQUE: Multidetector CT imaging of the chest, abdomen and pelvis was performed following the standard protocol during bolus administration of intravenous contrast. CONTRAST:  100mL OMNIPAQUE IOHEXOL 300 MG/ML  SOLN COMPARISON:  Most recent CT abdomen and pelvis 12/23/2019. 01/17/2020 PET-CT. FINDINGS: CT  CHEST FINDINGS Cardiovascular: Top-normal heart size. Stable configuration of 2 lead left subclavian ICD with lead tips in the right atrium and right ventricular apex. No significant pericardial effusion/thickening. Left anterior descending and left circumflex coronary atherosclerosis. Atherosclerotic nonaneurysmal thoracic aorta. Normal caliber pulmonary arteries. No central pulmonary emboli. Mediastinum/Nodes: No discrete thyroid nodules. Unremarkable esophagus. No axillary adenopathy. Previously mildly enlarged 1.1 cm AP window node is decreased to 0.9 cm (series 2/image 27). No new or residual pathologically enlarged mediastinal nodes. No pathologically enlarged hilar lymph nodes. Lungs/Pleura: No pneumothorax. No pleural effusion. Moderate centrilobular and paraseptal emphysema with diffuse bronchial wall thickening. Spiculated posterior right middle lobe 1.4 x 0.7 cm pulmonary nodule (series 6/image 109), previously 1.5 x 0.9 cm, slightly decreased. No acute consolidative airspace disease, lung masses or new significant pulmonary nodules. Stable small parenchymal band at the left lung base. Musculoskeletal: No aggressive appearing focal osseous   lesions. Symmetric mild-to-moderate bilateral gynecomastia, stable. CT ABDOMEN PELVIS FINDINGS Hepatobiliary: Normal liver with no liver mass. Cholecystectomy. No biliary ductal dilatation. Pancreas: Normal, with no mass or duct dilation. Spleen: Normal size. No mass. Adrenals/Urinary Tract: Stable 3.1 cm right adrenal nodule, characterized as an adenoma on recent PET-CT. Normal left adrenal. No hydronephrosis. Subcentimeter hypodense upper right renal cortical lesions are too small to characterize and are unchanged. No new renal lesions. Normal bladder. Stomach/Bowel: Normal non-distended stomach. Normal caliber small bowel with no small bowel wall thickening. Normal appendix. Normal large bowel with no diverticulosis, large bowel wall thickening or pericolonic fat  stranding. Vascular/Lymphatic: Atherosclerotic nonaneurysmal abdominal aorta. Patent portal, splenic, hepatic and renal veins. No pathologically enlarged lymph nodes in the abdomen or pelvis. Reproductive: Top-normal size prostate. Other: No pneumoperitoneum, ascites or focal fluid collection. Musculoskeletal: No aggressive appearing focal osseous lesions. Moderate lower lumbar spondylosis. IMPRESSION: 1. Spiculated right middle lobe pulmonary nodule is slightly decreased. 2. AP window adenopathy is decreased. 3. No new or progressive metastatic disease in the chest, abdomen or pelvis. 4. Aortic Atherosclerosis (ICD10-I70.0) and Emphysema (ICD10-J43.9). Electronically Signed   By: Jason A Poff M.D.   On: 03/13/2020 11:46   CT Abdomen Pelvis W Contrast  Result Date: 03/13/2020 CLINICAL DATA:  Primary Cancer Type: Lung Imaging Indication: Assess response to therapy Interval therapy since last imaging? Yes Initial Cancer Diagnosis Date: 12/28/2019; established by: Biopsy-proven Detailed Pathology: Extensive stage small cell lung cancer. Primary Tumor location: Right middle lobe Chemotherapy: Yes; Ongoing? Yes; Most recent administration: 02/21/2020 Immunotherapy?  Yes; type: Imfnizi; Ongoing? Yes Radiation therapy? Yes; Date Range: 01/04/2020-01/17/2020; Target: Brain EXAM: CT CHEST, ABDOMEN, AND PELVIS WITH CONTRAST TECHNIQUE: Multidetector CT imaging of the chest, abdomen and pelvis was performed following the standard protocol during bolus administration of intravenous contrast. CONTRAST:  100mL OMNIPAQUE IOHEXOL 300 MG/ML  SOLN COMPARISON:  Most recent CT abdomen and pelvis 12/23/2019. 01/17/2020 PET-CT. FINDINGS: CT CHEST FINDINGS Cardiovascular: Top-normal heart size. Stable configuration of 2 lead left subclavian ICD with lead tips in the right atrium and right ventricular apex. No significant pericardial effusion/thickening. Left anterior descending and left circumflex coronary atherosclerosis.  Atherosclerotic nonaneurysmal thoracic aorta. Normal caliber pulmonary arteries. No central pulmonary emboli. Mediastinum/Nodes: No discrete thyroid nodules. Unremarkable esophagus. No axillary adenopathy. Previously mildly enlarged 1.1 cm AP window node is decreased to 0.9 cm (series 2/image 27). No new or residual pathologically enlarged mediastinal nodes. No pathologically enlarged hilar lymph nodes. Lungs/Pleura: No pneumothorax. No pleural effusion. Moderate centrilobular and paraseptal emphysema with diffuse bronchial wall thickening. Spiculated posterior right middle lobe 1.4 x 0.7 cm pulmonary nodule (series 6/image 109), previously 1.5 x 0.9 cm, slightly decreased. No acute consolidative airspace disease, lung masses or new significant pulmonary nodules. Stable small parenchymal band at the left lung base. Musculoskeletal: No aggressive appearing focal osseous lesions. Symmetric mild-to-moderate bilateral gynecomastia, stable. CT ABDOMEN PELVIS FINDINGS Hepatobiliary: Normal liver with no liver mass. Cholecystectomy. No biliary ductal dilatation. Pancreas: Normal, with no mass or duct dilation. Spleen: Normal size. No mass. Adrenals/Urinary Tract: Stable 3.1 cm right adrenal nodule, characterized as an adenoma on recent PET-CT. Normal left adrenal. No hydronephrosis. Subcentimeter hypodense upper right renal cortical lesions are too small to characterize and are unchanged. No new renal lesions. Normal bladder. Stomach/Bowel: Normal non-distended stomach. Normal caliber small bowel with no small bowel wall thickening. Normal appendix. Normal large bowel with no diverticulosis, large bowel wall thickening or pericolonic fat stranding. Vascular/Lymphatic: Atherosclerotic nonaneurysmal abdominal aorta. Patent portal, splenic, hepatic and   renal veins. No pathologically enlarged lymph nodes in the abdomen or pelvis. Reproductive: Top-normal size prostate. Other: No pneumoperitoneum, ascites or focal fluid  collection. Musculoskeletal: No aggressive appearing focal osseous lesions. Moderate lower lumbar spondylosis. IMPRESSION: 1. Spiculated right middle lobe pulmonary nodule is slightly decreased. 2. AP window adenopathy is decreased. 3. No new or progressive metastatic disease in the chest, abdomen or pelvis. 4. Aortic Atherosclerosis (ICD10-I70.0) and Emphysema (ICD10-J43.9). Electronically Signed   By: Ilona Sorrel M.D.   On: 03/13/2020 11:46    ASSESSMENT AND PLAN: This is a very pleasant 59 years old African-American male recently diagnosed with extensive stage small cell lung cancer presented with spiculated right middle lobe pulmonary nodule in addition to mediastinal lymphadenopathy and multiple metastatic brain lesions.  The patient is status post whole brain irradiation. He is currently undergoing systemic chemotherapy with carboplatin, etoposide and Imfinzi status post 2 cycles. The patient has been tolerating this treatment well with no concerning adverse effects except for occasional nausea. He had repeat CT scan of the chest, abdomen pelvis performed recently.  I personally and independently reviewed the scan images and discussed the results with the patient today. He has a scan showed improvement of his disease with decrease in the right middle lobe pulmonary nodule as well as the AP window lymphadenopathy. I recommended for the patient to continue his current treatment and he will proceed with cycle #3 of carboplatin, etoposide and Imfinzi today. The patient was advised to increase his oral intake and nutritional supplements. For the nausea he will continue on Compazine on as-needed basis. He will come back for follow-up visit in 3 weeks for evaluation before starting cycle #4. He was advised to call immediately if he has any concerning symptoms in the interval. The patient voices understanding of current disease status and treatment options and is in agreement with the current care  plan.  All questions were answered. The patient knows to call the clinic with any problems, questions or concerns. We can certainly see the patient much sooner if necessary.  Disclaimer: This note was dictated with voice recognition software. Similar sounding words can inadvertently be transcribed and may not be corrected upon review.

## 2020-03-14 NOTE — Progress Notes (Signed)
Okay to treat with Scr 1.53, per Dr. Julien Nordmann.

## 2020-03-14 NOTE — Patient Instructions (Signed)
[facility] Discharge Instructions for Patients Receiving Chemotherapy  Today you received the following chemotherapy agents Durvalumab (IMFINZI), Carboplatin (PARAPLATIN) & Etoposide (VEPESID).  To help prevent nausea and vomiting after your treatment, we encourage you to take your nausea medication as prescribed.   If you develop nausea and vomiting that is not controlled by your nausea medication, call the clinic.   BELOW ARE SYMPTOMS THAT SHOULD BE REPORTED IMMEDIATELY:  *FEVER GREATER THAN 100.5 F  *CHILLS WITH OR WITHOUT FEVER  NAUSEA AND VOMITING THAT IS NOT CONTROLLED WITH YOUR NAUSEA MEDICATION  *UNUSUAL SHORTNESS OF BREATH  *UNUSUAL BRUISING OR BLEEDING  TENDERNESS IN MOUTH AND THROAT WITH OR WITHOUT PRESENCE OF ULCERS  *URINARY PROBLEMS  *BOWEL PROBLEMS  UNUSUAL RASH Items with * indicate a potential emergency and should be followed up as soon as possible.  Feel free to call the clinic should you have any questions or concerns. The clinic phone number is [phone].  Please show the [facility] at check-in to the Emergency Department and triage nurse.  Durvalumab injection What is this medicine? DURVALUMAB (dur VAL ue mab) is a monoclonal antibody. It is used to treat urothelial cancer and lung cancer. This medicine may be used for other purposes; ask your health care provider or pharmacist if you have questions. COMMON BRAND NAME(S): IMFINZI What should I tell my health care provider before I take this medicine? They need to know if you have any of these conditions:  diabetes  immune system problems  infection  inflammatory bowel disease  kidney disease  liver disease  lung or breathing disease  lupus  organ transplant  stomach or intestine problems  thyroid disease  an unusual or allergic reaction to durvalumab, other medicines, foods, dyes, or preservatives  pregnant or trying to get pregnant  breast-feeding How  should I use this medicine? This medicine is for infusion into a vein. It is given by a health care professional in a hospital or clinic setting. A special MedGuide will be given to you before each treatment. Be sure to read this information carefully each time. Talk to your pediatrician regarding the use of this medicine in children. Special care may be needed. Overdosage: If you think you have taken too much of this medicine contact a poison control center or emergency room at once. NOTE: This medicine is only for you. Do not share this medicine with others. What if I miss a dose? It is important not to miss your dose. Call your doctor or health care professional if you are unable to keep an appointment. What may interact with this medicine? Interactions have not been studied. This list may not describe all possible interactions. Give your health care provider a list of all the medicines, herbs, non-prescription drugs, or dietary supplements you use. Also tell them if you smoke, drink alcohol, or use illegal drugs. Some items may interact with your medicine. What should I watch for while using this medicine? This drug may make you feel generally unwell. Continue your course of treatment even though you feel ill unless your doctor tells you to stop. You may need blood work done while you are taking this medicine. Do not become pregnant while taking this medicine or for 3 months after stopping it. Women should inform their doctor if they wish to become pregnant or think they might be pregnant. There is a potential for serious side effects to an unborn child. Talk to your health care professional or pharmacist  for more information. Do not breast-feed an infant while taking this medicine or for 3 months after stopping it. What side effects may I notice from receiving this medicine? Side effects that you should report to your doctor or health care professional as soon as possible:  allergic reactions  like skin rash, itching or hives, swelling of the face, lips, or tongue  black, tarry stools  bloody or watery diarrhea  breathing problems  change in emotions or moods  change in sex drive  changes in vision  chest pain or chest tightness  chills  confusion  cough  facial flushing  fever  headache  signs and symptoms of high blood sugar such as dizziness; dry mouth; dry skin; fruity breath; nausea; stomach pain; increased hunger or thirst; increased urination  signs and symptoms of liver injury like dark yellow or brown urine; general ill feeling or flu-like symptoms; light-colored stools; loss of appetite; nausea; right upper belly pain; unusually weak or tired; yellowing of the eyes or skin  stomach pain  trouble passing urine or change in the amount of urine  weight gain or weight loss Side effects that usually do not require medical attention (report these to your doctor or health care professional if they continue or are bothersome):  bone pain  constipation  loss of appetite  muscle pain  nausea  swelling of the ankles, feet, hands  tiredness This list may not describe all possible side effects. Call your doctor for medical advice about side effects. You may report side effects to FDA at 1-800-FDA-1088. Where should I keep my medicine? This drug is given in a hospital or clinic and will not be stored at home. NOTE: This sheet is a summary. It may not cover all possible information. If you have questions about this medicine, talk to your doctor, pharmacist, or health care provider.  2020 Elsevier/Gold Standard (2016-12-09 19:25:04)  Carboplatin injection What is this medicine? CARBOPLATIN (KAR boe pla tin) is a chemotherapy drug. It targets fast dividing cells, like cancer cells, and causes these cells to die. This medicine is used to treat ovarian cancer and many other cancers. This medicine may be used for other purposes; ask your health care  provider or pharmacist if you have questions. COMMON BRAND NAME(S): Paraplatin What should I tell my health care provider before I take this medicine? They need to know if you have any of these conditions:  blood disorders  hearing problems  kidney disease  recent or ongoing radiation therapy  an unusual or allergic reaction to carboplatin, cisplatin, other chemotherapy, other medicines, foods, dyes, or preservatives  pregnant or trying to get pregnant  breast-feeding How should I use this medicine? This drug is usually given as an infusion into a vein. It is administered in a hospital or clinic by a specially trained health care professional. Talk to your pediatrician regarding the use of this medicine in children. Special care may be needed. Overdosage: If you think you have taken too much of this medicine contact a poison control center or emergency room at once. NOTE: This medicine is only for you. Do not share this medicine with others. What if I miss a dose? It is important not to miss a dose. Call your doctor or health care professional if you are unable to keep an appointment. What may interact with this medicine?  medicines for seizures  medicines to increase blood counts like filgrastim, pegfilgrastim, sargramostim  some antibiotics like amikacin, gentamicin, neomycin, streptomycin, tobramycin  vaccines Talk to your doctor or health care professional before taking any of these medicines:  acetaminophen  aspirin  ibuprofen  ketoprofen  naproxen This list may not describe all possible interactions. Give your health care provider a list of all the medicines, herbs, non-prescription drugs, or dietary supplements you use. Also tell them if you smoke, drink alcohol, or use illegal drugs. Some items may interact with your medicine. What should I watch for while using this medicine? Your condition will be monitored carefully while you are receiving this medicine. You  will need important blood work done while you are taking this medicine. This drug may make you feel generally unwell. This is not uncommon, as chemotherapy can affect healthy cells as well as cancer cells. Report any side effects. Continue your course of treatment even though you feel ill unless your doctor tells you to stop. In some cases, you may be given additional medicines to help with side effects. Follow all directions for their use. Call your doctor or health care professional for advice if you get a fever, chills or sore throat, or other symptoms of a cold or flu. Do not treat yourself. This drug decreases your body's ability to fight infections. Try to avoid being around people who are sick. This medicine may increase your risk to bruise or bleed. Call your doctor or health care professional if you notice any unusual bleeding. Be careful brushing and flossing your teeth or using a toothpick because you may get an infection or bleed more easily. If you have any dental work done, tell your dentist you are receiving this medicine. Avoid taking products that contain aspirin, acetaminophen, ibuprofen, naproxen, or ketoprofen unless instructed by your doctor. These medicines may hide a fever. Do not become pregnant while taking this medicine. Women should inform their doctor if they wish to become pregnant or think they might be pregnant. There is a potential for serious side effects to an unborn child. Talk to your health care professional or pharmacist for more information. Do not breast-feed an infant while taking this medicine. What side effects may I notice from receiving this medicine? Side effects that you should report to your doctor or health care professional as soon as possible:  allergic reactions like skin rash, itching or hives, swelling of the face, lips, or tongue  signs of infection - fever or chills, cough, sore throat, pain or difficulty passing urine  signs of decreased  platelets or bleeding - bruising, pinpoint red spots on the skin, black, tarry stools, nosebleeds  signs of decreased red blood cells - unusually weak or tired, fainting spells, lightheadedness  breathing problems  changes in hearing  changes in vision  chest pain  high blood pressure  low blood counts - This drug may decrease the number of white blood cells, red blood cells and platelets. You may be at increased risk for infections and bleeding.  nausea and vomiting  pain, swelling, redness or irritation at the injection site  pain, tingling, numbness in the hands or feet  problems with balance, talking, walking  trouble passing urine or change in the amount of urine Side effects that usually do not require medical attention (report to your doctor or health care professional if they continue or are bothersome):  hair loss  loss of appetite  metallic taste in the mouth or changes in taste This list may not describe all possible side effects. Call your doctor for medical advice about side effects. You may report side  effects to FDA at 1-800-FDA-1088. Where should I keep my medicine? This drug is given in a hospital or clinic and will not be stored at home. NOTE: This sheet is a summary. It may not cover all possible information. If you have questions about this medicine, talk to your doctor, pharmacist, or health care provider.  2020 Elsevier/Gold Standard (2008-01-04 14:38:05)  Etoposide, VP-16 injection What is this medicine? ETOPOSIDE, VP-16 (e toe POE side) is a chemotherapy drug. It is used to treat testicular cancer, lung cancer, and other cancers. This medicine may be used for other purposes; ask your health care provider or pharmacist if you have questions. COMMON BRAND NAME(S): Etopophos, Toposar, VePesid What should I tell my health care provider before I take this medicine? They need to know if you have any of these conditions:  infection  kidney  disease  liver disease  low blood counts, like low white cell, platelet, or red cell counts  an unusual or allergic reaction to etoposide, other medicines, foods, dyes, or preservatives  pregnant or trying to get pregnant  breast-feeding How should I use this medicine? This medicine is for infusion into a vein. It is administered in a hospital or clinic by a specially trained health care professional. Talk to your pediatrician regarding the use of this medicine in children. Special care may be needed. Overdosage: If you think you have taken too much of this medicine contact a poison control center or emergency room at once. NOTE: This medicine is only for you. Do not share this medicine with others. What if I miss a dose? It is important not to miss your dose. Call your doctor or health care professional if you are unable to keep an appointment. What may interact with this medicine? This medicine may interact with the following medications:  warfarin This list may not describe all possible interactions. Give your health care provider a list of all the medicines, herbs, non-prescription drugs, or dietary supplements you use. Also tell them if you smoke, drink alcohol, or use illegal drugs. Some items may interact with your medicine. What should I watch for while using this medicine? Visit your doctor for checks on your progress. This drug may make you feel generally unwell. This is not uncommon, as chemotherapy can affect healthy cells as well as cancer cells. Report any side effects. Continue your course of treatment even though you feel ill unless your doctor tells you to stop. In some cases, you may be given additional medicines to help with side effects. Follow all directions for their use. Call your doctor or health care professional for advice if you get a fever, chills or sore throat, or other symptoms of a cold or flu. Do not treat yourself. This drug decreases your body's ability to  fight infections. Try to avoid being around people who are sick. This medicine may increase your risk to bruise or bleed. Call your doctor or health care professional if you notice any unusual bleeding. Talk to your doctor about your risk of cancer. You may be more at risk for certain types of cancers if you take this medicine. Do not become pregnant while taking this medicine or for at least 6 months after stopping it. Women should inform their doctor if they wish to become pregnant or think they might be pregnant. Women of child-bearing potential will need to have a negative pregnancy test before starting this medicine. There is a potential for serious side effects to an unborn child. Talk  to your health care professional or pharmacist for more information. Do not breast-feed an infant while taking this medicine. Men must use a latex condom during sexual contact with a woman while taking this medicine and for at least 4 months after stopping it. A latex condom is needed even if you have had a vasectomy. Contact your doctor right away if your partner becomes pregnant. Do not donate sperm while taking this medicine and for at least 4 months after you stop taking this medicine. Men should inform their doctors if they wish to father a child. This medicine may lower sperm counts. What side effects may I notice from receiving this medicine? Side effects that you should report to your doctor or health care professional as soon as possible:  allergic reactions like skin rash, itching or hives, swelling of the face, lips, or tongue  low blood counts - this medicine may decrease the number of white blood cells, red blood cells, and platelets. You may be at increased risk for infections and bleeding  nausea, vomiting  redness, blistering, peeling or loosening of the skin, including inside the mouth  signs and symptoms of infection like fever; chills; cough; sore throat; pain or trouble passing urine  signs  and symptoms of low red blood cells or anemia such as unusually weak or tired; feeling faint or lightheaded; falls; breathing problems  unusual bruising or bleeding Side effects that usually do not require medical attention (report to your doctor or health care professional if they continue or are bothersome):  changes in taste  diarrhea  hair loss  loss of appetite  mouth sores This list may not describe all possible side effects. Call your doctor for medical advice about side effects. You may report side effects to FDA at 1-800-FDA-1088. Where should I keep my medicine? This drug is given in a hospital or clinic and will not be stored at home. NOTE: This sheet is a summary. It may not cover all possible information. If you have questions about this medicine, talk to your doctor, pharmacist, or health care provider.  2020 Elsevier/Gold Standard (2018-11-24 16:57:15)   Hyperglycemia Hyperglycemia is when the sugar (glucose) level in your blood is too high. It may not cause symptoms. If you do have symptoms, they may include warning signs, such as:  Feeling more thirsty than normal.  Hunger.  Feeling tired.  Needing to pee (urinate) more than normal.  Blurry eyesight (vision). You may get other symptoms as it gets worse, such as:  Dry mouth.  Not being hungry (loss of appetite).  Fruity-smelling breath.  Weakness.  Weight gain or loss that is not planned. Weight loss may be fast.  A tingling or numb feeling in your hands or feet.  Headache.  Skin that does not bounce back quickly when it is lightly pinched and released (poor skin turgor).  Pain in your belly (abdomen).  Cuts or bruises that heal slowly. High blood sugar can happen to people who do or do not have diabetes. High blood sugar can happen slowly or quickly, and it can be an emergency. Follow these instructions at home: General instructions  Take over-the-counter and prescription medicines only as  told by your doctor.  Do not use products that contain nicotine or tobacco, such as cigarettes and e-cigarettes. If you need help quitting, ask your doctor.  Limit alcohol intake to no more than 1 drink per day for nonpregnant women and 2 drinks per day for men. One drink equals 12  oz of beer, 5 oz of wine, or 1 oz of hard liquor.  Manage stress. If you need help with this, ask your doctor.  Keep all follow-up visits as told by your doctor. This is important. Eating and drinking   Stay at a healthy weight.  Exercise regularly, as told by your doctor.  Drink enough fluid, especially when you: ? Exercise. ? Get sick. ? Are in hot temperatures.  Eat healthy foods, such as: ? Low-fat (lean) proteins. ? Complex carbs (complex carbohydrates), such as whole wheat bread or brown rice. ? Fresh fruits and vegetables. ? Low-fat dairy products. ? Healthy fats.  Drink enough fluid to keep your pee (urine) clear or pale yellow. If you have diabetes:   Make sure you know the symptoms of hyperglycemia.  Follow your diabetes management plan, as told by your doctor. Make sure you: ? Take insulin and medicines as told. ? Follow your exercise plan. ? Follow your meal plan. Eat on time. Do not skip meals. ? Check your blood sugar as often as told. Make sure to check before and after exercise. If you exercise longer or in a different way than you normally do, check your blood sugar more often. ? Follow your sick day plan whenever you cannot eat or drink normally. Make this plan ahead of time with your doctor.  Share your diabetes management plan with people in your workplace, school, and household.  Check your urine for ketones when you are ill and as told by your doctor.  Carry a card or wear jewelry that says that you have diabetes. Contact a doctor if:  Your blood sugar level is higher than 240 mg/dL (13.3 mmol/L) for 2 days in a row.  You have problems keeping your blood sugar in  your target range.  High blood sugar happens often for you. Get help right away if:  You have trouble breathing.  You have a change in how you think, feel, or act (mental status).  You feel sick to your stomach (nauseous), and that feeling does not go away.  You cannot stop throwing up (vomiting). These symptoms may be an emergency. Do not wait to see if the symptoms will go away. Get medical help right away. Call your local emergency services (911 in the [place] Do not drive yourself to the hospital. Summary  Hyperglycemia is when the sugar (glucose) level in your blood is too high.  High blood sugar can happen to people who do or do not have diabetes.  Make sure you drink enough fluids, eat healthy foods, and exercise regularly.  Contact your doctor if you have problems keeping your blood sugar in your target range. This information is not intended to replace advice given to you by your health care provider. Make sure you discuss any questions you have with your health care provider. Document Revised: 06/16/2016 Document Reviewed: 06/16/2016 Elsevier Patient Education  [employer].

## 2020-03-14 NOTE — Progress Notes (Signed)
Calculated Carboplatin dose slightly outside of 10% difference. MD Julien Nordmann would like to reduce dose.  Larene Beach, PharmD

## 2020-03-15 ENCOUNTER — Inpatient Hospital Stay: Payer: Medicare Other

## 2020-03-15 ENCOUNTER — Other Ambulatory Visit: Payer: Self-pay

## 2020-03-15 ENCOUNTER — Other Ambulatory Visit: Payer: Self-pay | Admitting: Medical Oncology

## 2020-03-15 ENCOUNTER — Telehealth: Payer: Self-pay | Admitting: Internal Medicine

## 2020-03-15 VITALS — BP 101/72 | HR 91 | Temp 98.0°F | Resp 18

## 2020-03-15 DIAGNOSIS — Z5112 Encounter for antineoplastic immunotherapy: Secondary | ICD-10-CM | POA: Diagnosis not present

## 2020-03-15 DIAGNOSIS — I878 Other specified disorders of veins: Secondary | ICD-10-CM

## 2020-03-15 DIAGNOSIS — C3411 Malignant neoplasm of upper lobe, right bronchus or lung: Secondary | ICD-10-CM

## 2020-03-15 MED ORDER — SODIUM CHLORIDE 0.9 % IV SOLN
100.0000 mg/m2 | Freq: Once | INTRAVENOUS | Status: AC
  Administered 2020-03-15: 190 mg via INTRAVENOUS
  Filled 2020-03-15: qty 9.5

## 2020-03-15 MED ORDER — SODIUM CHLORIDE 0.9 % IV SOLN
10.0000 mg | Freq: Once | INTRAVENOUS | Status: AC
  Administered 2020-03-15: 10 mg via INTRAVENOUS
  Filled 2020-03-15: qty 10

## 2020-03-15 MED ORDER — SODIUM CHLORIDE 0.9 % IV SOLN
Freq: Once | INTRAVENOUS | Status: AC
  Filled 2020-03-15: qty 250

## 2020-03-15 NOTE — Patient Instructions (Signed)
[facility] Discharge Instructions for Patients Receiving Chemotherapy  Today you received the following chemotherapy agents: Etoposide  To help prevent nausea and vomiting after your treatment, we encourage you to take your nausea medication as prescribed.    If you develop nausea and vomiting that is not controlled by your nausea medication, call the clinic.   BELOW ARE SYMPTOMS THAT SHOULD BE REPORTED IMMEDIATELY:  *FEVER GREATER THAN 100.5 F  *CHILLS WITH OR WITHOUT FEVER  NAUSEA AND VOMITING THAT IS NOT CONTROLLED WITH YOUR NAUSEA MEDICATION  *UNUSUAL SHORTNESS OF BREATH  *UNUSUAL BRUISING OR BLEEDING  TENDERNESS IN MOUTH AND THROAT WITH OR WITHOUT PRESENCE OF ULCERS  *URINARY PROBLEMS  *BOWEL PROBLEMS  UNUSUAL RASH Items with * indicate a potential emergency and should be followed up as soon as possible.  Feel free to call the clinic should you have any questions or concerns. The clinic phone number is [phone].  Please show the [facility] at check-in to the Emergency Department and triage nurse.

## 2020-03-15 NOTE — Telephone Encounter (Signed)
apts already scheduled per 6/2 los -

## 2020-03-16 ENCOUNTER — Other Ambulatory Visit: Payer: Self-pay

## 2020-03-16 ENCOUNTER — Ambulatory Visit (INDEPENDENT_AMBULATORY_CARE_PROVIDER_SITE_OTHER): Payer: Medicare Other

## 2020-03-16 ENCOUNTER — Inpatient Hospital Stay: Payer: Medicare Other

## 2020-03-16 VITALS — BP 131/95 | HR 91 | Temp 97.8°F | Resp 18

## 2020-03-16 DIAGNOSIS — I428 Other cardiomyopathies: Secondary | ICD-10-CM

## 2020-03-16 DIAGNOSIS — Z5112 Encounter for antineoplastic immunotherapy: Secondary | ICD-10-CM | POA: Diagnosis not present

## 2020-03-16 DIAGNOSIS — C3411 Malignant neoplasm of upper lobe, right bronchus or lung: Secondary | ICD-10-CM

## 2020-03-16 DIAGNOSIS — Z7901 Long term (current) use of anticoagulants: Secondary | ICD-10-CM | POA: Diagnosis not present

## 2020-03-16 LAB — POCT INR: INR: 4.2 — AB (ref 2.0–3.0)

## 2020-03-16 MED ORDER — SODIUM CHLORIDE 0.9 % IV SOLN
10.0000 mg | Freq: Once | INTRAVENOUS | Status: AC
  Administered 2020-03-16: 10 mg via INTRAVENOUS
  Filled 2020-03-16: qty 10

## 2020-03-16 MED ORDER — SODIUM CHLORIDE 0.9 % IV SOLN
100.0000 mg/m2 | Freq: Once | INTRAVENOUS | Status: AC
  Administered 2020-03-16: 190 mg via INTRAVENOUS
  Filled 2020-03-16: qty 9.5

## 2020-03-16 MED ORDER — SODIUM CHLORIDE 0.9 % IV SOLN
Freq: Once | INTRAVENOUS | Status: AC
  Filled 2020-03-16: qty 250

## 2020-03-16 NOTE — Patient Instructions (Signed)
[facility] Discharge Instructions for Patients Receiving Chemotherapy  Today you received the following chemotherapy agents: Etoposide  To help prevent nausea and vomiting after your treatment, we encourage you to take your nausea medication as prescribed.    If you develop nausea and vomiting that is not controlled by your nausea medication, call the clinic.   BELOW ARE SYMPTOMS THAT SHOULD BE REPORTED IMMEDIATELY:  *FEVER GREATER THAN 100.5 F  *CHILLS WITH OR WITHOUT FEVER  NAUSEA AND VOMITING THAT IS NOT CONTROLLED WITH YOUR NAUSEA MEDICATION  *UNUSUAL SHORTNESS OF BREATH  *UNUSUAL BRUISING OR BLEEDING  TENDERNESS IN MOUTH AND THROAT WITH OR WITHOUT PRESENCE OF ULCERS  *URINARY PROBLEMS  *BOWEL PROBLEMS  UNUSUAL RASH Items with * indicate a potential emergency and should be followed up as soon as possible.  Feel free to call the clinic should you have any questions or concerns. The clinic phone number is [phone].  Please show the [facility] at check-in to the Emergency Department and triage nurse.

## 2020-03-16 NOTE — Patient Instructions (Signed)
Description   Skip today's dosage of Warfarin, then start taking 1 tablet every day except 1/2 tablet on Mondays, Wednesdays, Fridays, and Sundays. Recheck in 2 weeks.  Main # [phone] Coumadin Clinic # [phone].

## 2020-03-19 ENCOUNTER — Ambulatory Visit: Payer: Medicare Other

## 2020-03-19 ENCOUNTER — Other Ambulatory Visit: Payer: Medicare Other

## 2020-03-20 ENCOUNTER — Inpatient Hospital Stay: Payer: Medicare Other

## 2020-03-20 ENCOUNTER — Telehealth: Payer: Self-pay

## 2020-03-20 ENCOUNTER — Telehealth: Payer: Self-pay | Admitting: *Deleted

## 2020-03-20 ENCOUNTER — Other Ambulatory Visit: Payer: Self-pay

## 2020-03-20 VITALS — BP 105/66 | HR 99 | Temp 98.2°F | Resp 18

## 2020-03-20 DIAGNOSIS — Z5112 Encounter for antineoplastic immunotherapy: Secondary | ICD-10-CM | POA: Diagnosis not present

## 2020-03-20 DIAGNOSIS — C3411 Malignant neoplasm of upper lobe, right bronchus or lung: Secondary | ICD-10-CM

## 2020-03-20 DIAGNOSIS — C3490 Malignant neoplasm of unspecified part of unspecified bronchus or lung: Secondary | ICD-10-CM

## 2020-03-20 LAB — CBC WITH DIFFERENTIAL (CANCER CENTER ONLY)
Abs Immature Granulocytes: 0.04 10*3/uL (ref 0.00–0.07)
Basophils Absolute: 0 10*3/uL (ref 0.0–0.1)
Basophils Relative: 0 %
Eosinophils Absolute: 0.1 10*3/uL (ref 0.0–0.5)
Eosinophils Relative: 1 %
HCT: 29.7 % — ABNORMAL LOW (ref 39.0–52.0)
Hemoglobin: 9.6 g/dL — ABNORMAL LOW (ref 13.0–17.0)
Immature Granulocytes: 1 %
Lymphocytes Relative: 21 %
Lymphs Abs: 1.6 10*3/uL (ref 0.7–4.0)
MCH: 32 pg (ref 26.0–34.0)
MCHC: 32.3 g/dL (ref 30.0–36.0)
MCV: 99 fL (ref 80.0–100.0)
Monocytes Absolute: 0.2 10*3/uL (ref 0.1–1.0)
Monocytes Relative: 2 %
Neutro Abs: 5.6 10*3/uL (ref 1.7–7.7)
Neutrophils Relative %: 75 %
Platelet Count: 262 10*3/uL (ref 150–400)
RBC: 3 MIL/uL — ABNORMAL LOW (ref 4.22–5.81)
RDW: 15.6 % — ABNORMAL HIGH (ref 11.5–15.5)
WBC Count: 7.5 10*3/uL (ref 4.0–10.5)
nRBC: 0 % (ref 0.0–0.2)

## 2020-03-20 LAB — CMP (CANCER CENTER ONLY)
ALT: 15 U/L (ref 0–44)
AST: 12 U/L — ABNORMAL LOW (ref 15–41)
Albumin: 3.5 g/dL (ref 3.5–5.0)
Alkaline Phosphatase: 115 U/L (ref 38–126)
Anion gap: 10 (ref 5–15)
BUN: 28 mg/dL — ABNORMAL HIGH (ref 6–20)
CO2: 24 mmol/L (ref 22–32)
Calcium: 9.7 mg/dL (ref 8.9–10.3)
Chloride: 102 mmol/L (ref 98–111)
Creatinine: 1.43 mg/dL — ABNORMAL HIGH (ref 0.61–1.24)
GFR, Est AFR Am: >60 mL/min (ref >60)
GFR, Estimated: 54 mL/min — ABNORMAL LOW (ref >60)
Glucose, Bld: 363 mg/dL — ABNORMAL HIGH (ref 70–99)
Potassium: 4.7 mmol/L (ref 3.5–5.1)
Sodium: 136 mmol/L (ref 135–145)
Total Bilirubin: 0.4 mg/dL (ref 0.3–1.2)
Total Protein: 6.5 g/dL (ref 6.5–8.1)

## 2020-03-20 MED ORDER — PEGFILGRASTIM-CBQV 6 MG/0.6ML ~~LOC~~ SOSY
6.0000 mg | PREFILLED_SYRINGE | Freq: Once | SUBCUTANEOUS | Status: AC
  Administered 2020-03-20: 6 mg via SUBCUTANEOUS

## 2020-03-20 MED ORDER — PEGFILGRASTIM-CBQV 6 MG/0.6ML ~~LOC~~ SOSY
PREFILLED_SYRINGE | SUBCUTANEOUS | Status: AC
  Filled 2020-03-20: qty 0.6

## 2020-03-20 NOTE — Telephone Encounter (Signed)
Heilingoetter, Cassandra L, PA-C  Wilmon Arms, RN  Can you call him and make sure he keeps an eye on his blood sugars? I want him to check it to make sure it goes down and to take his medications as prescribed. If it runs high frequently, then he needs to talk to his PCP about adjusting his medications.          Attempted to reach patient, left message for returned call.  Pending call back.

## 2020-03-20 NOTE — Patient Instructions (Addendum)

## 2020-03-20 NOTE — Telephone Encounter (Signed)
Spoke with patient to remind of missed remote transmission 

## 2020-03-22 ENCOUNTER — Telehealth: Payer: Self-pay | Admitting: *Deleted

## 2020-03-22 NOTE — Telephone Encounter (Signed)
Attempted second time to reach patient to discuss labs.  Pending returned call.

## 2020-03-26 NOTE — Telephone Encounter (Signed)
Spoke with patient directly about last lab glucose value.  Confirmed he isn't taking the oral Decadron but is taking the IV decadron with treatment.  Instructed that his levels are rising and he should confer with his PCP who manages his insulin and metformin to adjust if needed.   Patient stated understanding and no further questions.

## 2020-03-27 ENCOUNTER — Inpatient Hospital Stay: Payer: Medicare Other

## 2020-03-27 ENCOUNTER — Other Ambulatory Visit: Payer: Self-pay

## 2020-03-27 DIAGNOSIS — Z5112 Encounter for antineoplastic immunotherapy: Secondary | ICD-10-CM | POA: Diagnosis not present

## 2020-03-27 DIAGNOSIS — C3490 Malignant neoplasm of unspecified part of unspecified bronchus or lung: Secondary | ICD-10-CM

## 2020-03-27 LAB — CBC WITH DIFFERENTIAL (CANCER CENTER ONLY)
Abs Immature Granulocytes: 0.61 10*3/uL — ABNORMAL HIGH (ref 0.00–0.07)
Basophils Absolute: 0.1 10*3/uL (ref 0.0–0.1)
Basophils Relative: 1 %
Eosinophils Absolute: 0.1 10*3/uL (ref 0.0–0.5)
Eosinophils Relative: 2 %
HCT: 30.6 % — ABNORMAL LOW (ref 39.0–52.0)
Hemoglobin: 9.6 g/dL — ABNORMAL LOW (ref 13.0–17.0)
Immature Granulocytes: 7 %
Lymphocytes Relative: 26 %
Lymphs Abs: 2.3 10*3/uL (ref 0.7–4.0)
MCH: 31.9 pg (ref 26.0–34.0)
MCHC: 31.4 g/dL (ref 30.0–36.0)
MCV: 101.7 fL — ABNORMAL HIGH (ref 80.0–100.0)
Monocytes Absolute: 1 10*3/uL (ref 0.1–1.0)
Monocytes Relative: 11 %
Neutro Abs: 4.8 10*3/uL (ref 1.7–7.7)
Neutrophils Relative %: 53 %
Platelet Count: 99 10*3/uL — ABNORMAL LOW (ref 150–400)
RBC: 3.01 MIL/uL — ABNORMAL LOW (ref 4.22–5.81)
RDW: 15.9 % — ABNORMAL HIGH (ref 11.5–15.5)
WBC Count: 8.9 10*3/uL (ref 4.0–10.5)
nRBC: 0 % (ref 0.0–0.2)

## 2020-03-27 LAB — CMP (CANCER CENTER ONLY)
ALT: 17 U/L (ref 0–44)
AST: 13 U/L — ABNORMAL LOW (ref 15–41)
Albumin: 3.5 g/dL (ref 3.5–5.0)
Alkaline Phosphatase: 146 U/L — ABNORMAL HIGH (ref 38–126)
Anion gap: 12 (ref 5–15)
BUN: 16 mg/dL (ref 6–20)
CO2: 24 mmol/L (ref 22–32)
Calcium: 9.6 mg/dL (ref 8.9–10.3)
Chloride: 105 mmol/L (ref 98–111)
Creatinine: 1.25 mg/dL — ABNORMAL HIGH (ref 0.61–1.24)
GFR, Est AFR Am: >60 mL/min (ref >60)
GFR, Estimated: >60 mL/min (ref >60)
Glucose, Bld: 143 mg/dL — ABNORMAL HIGH (ref 70–99)
Potassium: 4.4 mmol/L (ref 3.5–5.1)
Sodium: 141 mmol/L (ref 135–145)
Total Bilirubin: 0.3 mg/dL (ref 0.3–1.2)
Total Protein: 6.5 g/dL (ref 6.5–8.1)

## 2020-03-28 NOTE — Progress Notes (Signed)
Pharmacist Chemotherapy Monitoring - Follow Up Assessment    I verify that I have reviewed each item in the below checklist:  . Regimen for the patient is scheduled for the appropriate day and plan matches scheduled date. Marland Kitchen Appropriate non-routine labs are ordered dependent on drug ordered. . If applicable, additional medications reviewed and ordered per protocol based on lifetime cumulative doses and/or treatment regimen.   Plan for follow-up and/or issues identified: No . I-vent associated with next due treatment: No . MD and/or nursing notified: No  Kennith Center, Pharm.D., CPP 03/28/2020@1 :16 PM

## 2020-03-30 ENCOUNTER — Ambulatory Visit (INDEPENDENT_AMBULATORY_CARE_PROVIDER_SITE_OTHER): Payer: Medicare Other | Admitting: *Deleted

## 2020-03-30 ENCOUNTER — Other Ambulatory Visit: Payer: Self-pay

## 2020-03-30 DIAGNOSIS — I428 Other cardiomyopathies: Secondary | ICD-10-CM | POA: Diagnosis not present

## 2020-03-30 DIAGNOSIS — Z7901 Long term (current) use of anticoagulants: Secondary | ICD-10-CM

## 2020-03-30 LAB — POCT INR: INR: 1.1 — AB (ref 2.0–3.0)

## 2020-03-30 NOTE — Patient Instructions (Signed)
Description   Take 1 tablet today and 1.5 tablets tomorrow, then continue taking 1 tablet every day except 1/2 tablet on Mondays, Wednesdays, Fridays, and Sundays. Recheck in 1 week.  Main # [phone] Coumadin Clinic # [phone].

## 2020-04-02 ENCOUNTER — Ambulatory Visit (INDEPENDENT_AMBULATORY_CARE_PROVIDER_SITE_OTHER): Payer: Medicare Other | Admitting: *Deleted

## 2020-04-02 ENCOUNTER — Ambulatory Visit: Payer: Medicare Other | Admitting: *Deleted

## 2020-04-02 DIAGNOSIS — I5022 Chronic systolic (congestive) heart failure: Secondary | ICD-10-CM | POA: Diagnosis not present

## 2020-04-02 DIAGNOSIS — I255 Ischemic cardiomyopathy: Secondary | ICD-10-CM

## 2020-04-02 LAB — CUP PACEART REMOTE DEVICE CHECK
Battery Remaining Longevity: 7 mo
Battery Remaining Percentage: 6 %
Battery Voltage: 2.63 V
Brady Statistic AP VP Percent: 1 %
Brady Statistic AP VS Percent: 1 %
Brady Statistic AS VP Percent: 1 %
Brady Statistic AS VS Percent: 99 %
Brady Statistic RA Percent Paced: 1 %
Brady Statistic RV Percent Paced: 1 %
Date Time Interrogation Session: 20210619132928
HighPow Impedance: 60 Ohm
HighPow Impedance: 60 Ohm
Implantable Lead Implant Date: 20110513
Implantable Lead Implant Date: 20110513
Implantable Lead Location: 753859
Implantable Lead Location: 753860
Implantable Pulse Generator Implant Date: 20110513
Lead Channel Impedance Value: 260 Ohm
Lead Channel Impedance Value: 300 Ohm
Lead Channel Pacing Threshold Amplitude: 0.5 V
Lead Channel Pacing Threshold Amplitude: 0.75 V
Lead Channel Pacing Threshold Pulse Width: 0.5 ms
Lead Channel Pacing Threshold Pulse Width: 0.5 ms
Lead Channel Sensing Intrinsic Amplitude: 12 mV
Lead Channel Sensing Intrinsic Amplitude: 5 mV
Lead Channel Setting Pacing Amplitude: 2 V
Lead Channel Setting Pacing Amplitude: 2.5 V
Lead Channel Setting Pacing Pulse Width: 0.5 ms
Lead Channel Setting Sensing Sensitivity: 0.5 mV
Pulse Gen Serial Number: 608100

## 2020-04-03 ENCOUNTER — Inpatient Hospital Stay: Payer: Medicare Other

## 2020-04-03 ENCOUNTER — Encounter: Payer: Self-pay | Admitting: Internal Medicine

## 2020-04-03 ENCOUNTER — Inpatient Hospital Stay (HOSPITAL_BASED_OUTPATIENT_CLINIC_OR_DEPARTMENT_OTHER): Payer: Medicare Other | Admitting: Internal Medicine

## 2020-04-03 ENCOUNTER — Other Ambulatory Visit: Payer: Self-pay

## 2020-04-03 VITALS — BP 102/71 | HR 91 | Temp 97.8°F | Resp 18 | Ht 66.0 in | Wt 152.6 lb

## 2020-04-03 DIAGNOSIS — Z5112 Encounter for antineoplastic immunotherapy: Secondary | ICD-10-CM | POA: Diagnosis not present

## 2020-04-03 DIAGNOSIS — C3490 Malignant neoplasm of unspecified part of unspecified bronchus or lung: Secondary | ICD-10-CM | POA: Diagnosis not present

## 2020-04-03 DIAGNOSIS — C3411 Malignant neoplasm of upper lobe, right bronchus or lung: Secondary | ICD-10-CM

## 2020-04-03 LAB — CMP (CANCER CENTER ONLY)
ALT: 17 U/L (ref 0–44)
AST: 12 U/L — ABNORMAL LOW (ref 15–41)
Albumin: 3.3 g/dL — ABNORMAL LOW (ref 3.5–5.0)
Alkaline Phosphatase: 143 U/L — ABNORMAL HIGH (ref 38–126)
Anion gap: 10 (ref 5–15)
BUN: 21 mg/dL — ABNORMAL HIGH (ref 6–20)
CO2: 24 mmol/L (ref 22–32)
Calcium: 9.1 mg/dL (ref 8.9–10.3)
Chloride: 104 mmol/L (ref 98–111)
Creatinine: 1.38 mg/dL — ABNORMAL HIGH (ref 0.61–1.24)
GFR, Est AFR Am: >60 mL/min (ref >60)
GFR, Estimated: 56 mL/min — ABNORMAL LOW (ref >60)
Glucose, Bld: 309 mg/dL — ABNORMAL HIGH (ref 70–99)
Potassium: 4.9 mmol/L (ref 3.5–5.1)
Sodium: 138 mmol/L (ref 135–145)
Total Bilirubin: 0.3 mg/dL (ref 0.3–1.2)
Total Protein: 6.4 g/dL — ABNORMAL LOW (ref 6.5–8.1)

## 2020-04-03 LAB — CBC WITH DIFFERENTIAL (CANCER CENTER ONLY)
Abs Immature Granulocytes: 0.11 10*3/uL — ABNORMAL HIGH (ref 0.00–0.07)
Basophils Absolute: 0.1 10*3/uL (ref 0.0–0.1)
Basophils Relative: 0 %
Eosinophils Absolute: 0.1 10*3/uL (ref 0.0–0.5)
Eosinophils Relative: 1 %
HCT: 32.3 % — ABNORMAL LOW (ref 39.0–52.0)
Hemoglobin: 10.1 g/dL — ABNORMAL LOW (ref 13.0–17.0)
Immature Granulocytes: 1 %
Lymphocytes Relative: 17 %
Lymphs Abs: 2.2 10*3/uL (ref 0.7–4.0)
MCH: 32 pg (ref 26.0–34.0)
MCHC: 31.3 g/dL (ref 30.0–36.0)
MCV: 102.2 fL — ABNORMAL HIGH (ref 80.0–100.0)
Monocytes Absolute: 0.9 10*3/uL (ref 0.1–1.0)
Monocytes Relative: 7 %
Neutro Abs: 9.9 10*3/uL — ABNORMAL HIGH (ref 1.7–7.7)
Neutrophils Relative %: 74 %
Platelet Count: 241 10*3/uL (ref 150–400)
RBC: 3.16 MIL/uL — ABNORMAL LOW (ref 4.22–5.81)
RDW: 15.3 % (ref 11.5–15.5)
WBC Count: 13.4 10*3/uL — ABNORMAL HIGH (ref 4.0–10.5)
nRBC: 0 % (ref 0.0–0.2)

## 2020-04-03 LAB — TSH: TSH: 0.505 u[IU]/mL (ref 0.320–4.118)

## 2020-04-03 MED ORDER — PALONOSETRON HCL INJECTION 0.25 MG/5ML
0.2500 mg | Freq: Once | INTRAVENOUS | Status: AC
  Administered 2020-04-03: 0.25 mg via INTRAVENOUS

## 2020-04-03 MED ORDER — SODIUM CHLORIDE 0.9 % IV SOLN
1500.0000 mg | Freq: Once | INTRAVENOUS | Status: AC
  Administered 2020-04-03: 1500 mg via INTRAVENOUS
  Filled 2020-04-03: qty 30

## 2020-04-03 MED ORDER — SODIUM CHLORIDE 0.9 % IV SOLN
419.5000 mg | Freq: Once | INTRAVENOUS | Status: AC
  Administered 2020-04-03: 420 mg via INTRAVENOUS
  Filled 2020-04-03: qty 42

## 2020-04-03 MED ORDER — SODIUM CHLORIDE 0.9 % IV SOLN
Freq: Once | INTRAVENOUS | Status: AC
  Filled 2020-04-03: qty 250

## 2020-04-03 MED ORDER — SODIUM CHLORIDE 0.9 % IV SOLN
150.0000 mg | Freq: Once | INTRAVENOUS | Status: AC
  Administered 2020-04-03: 150 mg via INTRAVENOUS
  Filled 2020-04-03: qty 150

## 2020-04-03 MED ORDER — SODIUM CHLORIDE 0.9 % IV SOLN
100.0000 mg/m2 | Freq: Once | INTRAVENOUS | Status: AC
  Administered 2020-04-03: 190 mg via INTRAVENOUS
  Filled 2020-04-03: qty 9.5

## 2020-04-03 MED ORDER — PALONOSETRON HCL INJECTION 0.25 MG/5ML
INTRAVENOUS | Status: AC
  Filled 2020-04-03: qty 5

## 2020-04-03 MED ORDER — SODIUM CHLORIDE 0.9 % IV SOLN
10.0000 mg | Freq: Once | INTRAVENOUS | Status: AC
  Administered 2020-04-03: 10 mg via INTRAVENOUS
  Filled 2020-04-03: qty 10

## 2020-04-03 NOTE — Progress Notes (Signed)
Remote ICD transmission.   

## 2020-04-03 NOTE — Patient Instructions (Signed)
Steps to Quit Smoking Smoking tobacco is the leading cause of preventable death. It can affect almost every organ in the body. Smoking puts you and people around you at risk for many serious, long-lasting (chronic) diseases. Quitting smoking can be hard, but it is one of the best things that you can do for your health. It is never too late to quit. How do I get ready to quit? When you decide to quit smoking, make a plan to help you succeed. Before you quit:  Pick a date to quit. Set a date within the next 2 weeks to give you time to prepare.  Write down the reasons why you are quitting. Keep this list in places where you will see it often.  Tell your family, friends, and co-workers that you are quitting. Their support is important.  Talk with your doctor about the choices that may help you quit.  Find out if your health insurance will pay for these treatments.  Know the people, places, things, and activities that make you want to smoke (triggers). Avoid them. What first steps can I take to quit smoking?  Throw away all cigarettes at home, at work, and in your car.  Throw away the things that you use when you smoke, such as ashtrays and lighters.  Clean your car. Make sure to empty the ashtray.  Clean your home, including curtains and carpets. What can I do to help me quit smoking? Talk with your doctor about taking medicines and seeing a counselor at the same time. You are more likely to succeed when you do both.  If you are pregnant or breastfeeding, talk with your doctor about counseling or other ways to quit smoking. Do not take medicine to help you quit smoking unless your doctor tells you to do so. To quit smoking: Quit right away  Quit smoking totally, instead of slowly cutting back on how much you smoke over a period of time.  Go to counseling. You are more likely to quit if you go to counseling sessions regularly. Take medicine You may take medicines to help you quit. Some  medicines need a prescription, and some you can buy over-the-counter. Some medicines may contain a drug called nicotine to replace the nicotine in cigarettes. Medicines may:  Help you to stop having the desire to smoke (cravings).  Help to stop the problems that come when you stop smoking (withdrawal symptoms). Your doctor may ask you to use:  Nicotine patches, gum, or lozenges.  Nicotine inhalers or sprays.  Non-nicotine medicine that is taken by mouth. Find resources Find resources and other ways to help you quit smoking and remain smoke-free after you quit. These resources are most helpful when you use them often. They include:  Online chats with a counselor.  Phone quitlines.  Printed self-help materials.  Support groups or group counseling.  Text messaging programs.  Mobile phone apps. Use apps on your mobile phone or tablet that can help you stick to your quit plan. There are many free apps for mobile phones and tablets as well as websites. Examples include Quit Guide from the CDC and smokefree.gov  What things can I do to make it easier to quit?   Talk to your family and friends. Ask them to support and encourage you.  Call a phone quitline (1-800-QUIT-NOW), reach out to support groups, or work with a counselor.  Ask people who smoke to not smoke around you.  Avoid places that make you want to smoke,   such as: ? Bars. ? Parties. ? Smoke-break areas at work.  Spend time with people who do not smoke.  Lower the stress in your life. Stress can make you want to smoke. Try these things to help your stress: ? Getting regular exercise. ? Doing deep-breathing exercises. ? Doing yoga. ? Meditating. ? Doing a body scan. To do this, close your eyes, focus on one area of your body at a time from head to toe. Notice which parts of your body are tense. Try to relax the muscles in those areas. How will I feel when I quit smoking? Day 1 to 3 weeks Within the first 24 hours,  you may start to have some problems that come from quitting tobacco. These problems are very bad 2-3 days after you quit, but they do not often last for more than 2-3 weeks. You may get these symptoms:  Mood swings.  Feeling restless, nervous, angry, or annoyed.  Trouble concentrating.  Dizziness.  Strong desire for high-sugar foods and nicotine.  Weight gain.  Trouble pooping (constipation).  Feeling like you may vomit (nausea).  Coughing or a sore throat.  Changes in how the medicines that you take for other issues work in your body.  Depression.  Trouble sleeping (insomnia). Week 3 and afterward After the first 2-3 weeks of quitting, you may start to notice more positive results, such as:  Better sense of smell and taste.  Less coughing and sore throat.  Slower heart rate.  Lower blood pressure.  Clearer skin.  Better breathing.  Fewer sick days. Quitting smoking can be hard. Do not give up if you fail the first time. Some people need to try a few times before they succeed. Do your best to stick to your quit plan, and talk with your doctor if you have any questions or concerns. Summary  Smoking tobacco is the leading cause of preventable death. Quitting smoking can be hard, but it is one of the best things that you can do for your health.  When you decide to quit smoking, make a plan to help you succeed.  Quit smoking right away, not slowly over a period of time.  When you start quitting, seek help from your doctor, family, or friends. This information is not intended to replace advice given to you by your health care provider. Make sure you discuss any questions you have with your health care provider. Document Revised: 06/24/2019 Document Reviewed: 12/18/2018 Elsevier Patient Education  2020 Elsevier Inc.  

## 2020-04-03 NOTE — Progress Notes (Signed)
[facility] Telephone:([phone]) [name]   [phone]  OFFICE PROGRESS NOTE  System, Pcp Not In No address on file  DIAGNOSIS: Extensive stage small cell lung cancer. He presented with a spiculated right middle lobe pulmonary nodule and mildly enlargedsolitary aortopulmonary lymph node. 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 He was diagnosed in March 2021.  PRIOR THERAPY: 1) Whole brain radiation under the care of Dr. Lisbeth Renshaw. Completed 01/17/2020  CURRENT THERAPY: Systemic chemotherapy withcarboplatin for an AUC of 5 on day 1,etoposide 100 mg/m on days 1, 2, and 3, and Imfinzi 1500 mg on day 1 IV every 3 weeks. First dose expected on4/20/21. Status post 3 cycles.  INTERVAL HISTORY: Erick Murin. 59 y.o. male returns to the clinic today for follow-up visit.  The patient is feeling fine today with no concerning complaints except for occasional dizzy spells as well as nausea and vomiting.  The patient has no abdominal pain.  He denied having any headache or visual changes.  He has no chest pain, shortness of breath, cough or hemoptysis.  He continues to tolerate her systemic chemotherapy fairly well.  The patient is here today for evaluation before starting cycle #4 of his treatment.  MEDICAL HISTORY: Past Medical History:  Diagnosis Date  . Alcohol abuse with alcohol-induced mood disorder ([place]) 08/18/2015  . Automatic implantable cardioverter-defibrillator in situ 2012   S/P St Jude Dual Chamber. ICD.  Marland Kitchen CAD (coronary artery disease) 11/16/2014   CAD Coronary angiography (12/2008) with mid LAD totally occluded and collateralized.  . Cardiac LV ejection fraction 10-20%   . Chest pain 09/04/2018  . CHF (congestive heart failure) ([place])   . Chronic systolic heart failure (HCC)    a) Mixed ICM/NICM b) RHC (05/2014): RA 2, RV 19/2/3, PA 22/14 (18), PCWP 6, Fick CO/CI: 5.2 / 2.7, PVR 2.3 WU, PA 60% and 64% c) ECHO  (05/2014): EF 20-25%, diff HK, akinesis entireanteroseptal myocardium, triv AI, mod MR, LA mod/sev dilated  . Cocaine abuse ([place]) 01/24/2015  . Cocaine abuse with cocaine-induced mood disorder ([place]) 08/20/2015  . Drug abuse and dependence ([place])   . Dysphagia following cerebral infarction 01/24/2015  . Embolic stroke involving right middle cerebral artery ([place])   . Extensive stage primary small cell carcinoma of lung ([place]) 01/04/2020  . H/O noncompliance with medical treatment, presenting hazards to health 01/24/2015  . Heart murmur   . High cholesterol   . Ischemic cardiomyopathy    a) Coronary angiography (12/2008) at [facility]: Lmain: nl, LAD mid 100% stenosis with left to left and right-to-left collaterals to the distal LAD; Lcx: nl, RCA nl.    . 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[place])   . MDD (major depressive disorder), recurrent severe, without psychosis ([place]) 08/18/2015  . Open wound of foot 02/27/2015  . Pneumonia 1988  . Psychoactive substance-induced mood disorder ([place]) 07/17/2015  . Sleep apnea    "cleared after T&A"  . Status post PICC central line placement   . Stroke ([place])   . Substance induced mood disorder ([place]) 08/17/2015  . Type II diabetes mellitus (HCC)     ALLERGIES:  has No Known Allergies.  MEDICATIONS:  Current Outpatient Medications  Medication Sig Dispense Refill  . Accu-Chek Softclix Lancets lancets     . atorvastatin (LIPITOR) 20 MG tablet TAKE 1 TABLET BY MOUTH DAILY AT 6 PM. (Patient taking differently: Take 20 mg by mouth daily at 6 PM. )  30 tablet 6  . Blood Glucose Monitoring Suppl (FIFTY50 GLUCOSE METER 2.0) w/Device KIT Use as instructed    . carvedilol (COREG) 6.25 MG tablet Take 1.5 tablets (9.375 mg total) by mouth 2 (two) times daily with a meal. 90 tablet 3  . dexamethasone (DECADRON) 4 MG tablet Take 1 tablet (4 mg total) by mouth 2 (two) times daily. Please also instruct pt on taking omeprazole once daily while taking steroids (Patient not taking: Reported  on 02/07/2020) 60 tablet 0  . empagliflozin (JARDIANCE) 10 MG TABS tablet Take 10 mg by mouth daily before breakfast. 30 tablet 6  . Insulin Glargine (LANTUS SOLOSTAR) 100 UNIT/ML Solostar Pen Inject 36 Units into the skin daily at 10 pm.     . INSUPEN ULTRAFIN 31G X 8 MM MISC     . memantine (NAMENDA) 5 MG tablet Start on 1st day of radiation. Week 1: take one po daily. Week 2: take one po twice daily. Week 3: take two po qam, and one po q pm. Week 4: take 2 po twice daily for remaining 20 weeks. 630 tablet 0  . metFORMIN (GLUCOPHAGE-XR) 500 MG 24 hr tablet Take 1,000 mg by mouth daily with breakfast. Per [facility] pharmacist    . ondansetron (ZOFRAN) 8 MG tablet Take 1 tablet (8 mg total) by mouth every 8 (eight) hours as needed for nausea or vomiting. 20 tablet 0  . pantoprazole (PROTONIX) 40 MG tablet Take 1 tablet by mouth daily.    . prochlorperazine (COMPAZINE) 10 MG tablet Take 1 tablet (10 mg total) by mouth every 6 (six) hours as needed. 30 tablet 2  . sacubitril-valsartan (ENTRESTO) 24-26 MG Take 1 tablet by mouth 2 (two) times daily. 60 tablet 3  . spironolactone (ALDACTONE) 25 MG tablet Take 1 tablet (25 mg total) by mouth daily. 90 tablet 3  . warfarin (COUMADIN) 7.5 MG tablet TAKE 1 TABLET BY MOUTH DAILY AT 6PM. 30 tablet 0   No current facility-administered medications for this visit.    SURGICAL HISTORY:  Past Surgical History:  Procedure Laterality Date  . BRONCHIAL BRUSHINGS  12/28/2019   Procedure: BRONCHIAL BRUSHINGS;  Surgeon: Garner Nash, DO;  Location: [facility];  Service: Thoracic;;  . BRONCHIAL NEEDLE ASPIRATION BIOPSY  12/28/2019   Procedure: BRONCHIAL NEEDLE ASPIRATION BIOPSIES;  Surgeon: Garner Nash, DO;  Location: [facility];  Service: Thoracic;;  . BRONCHIAL WASHINGS  12/28/2019   Procedure: BRONCHIAL WASHINGS;  Surgeon: Garner Nash, DO;  Location: [facility];  Service: Thoracic;;  . CARDIAC CATHETERIZATION  05/2014  . CARDIAC DEFIBRILLATOR  PLACEMENT  03/2011  . CHOLECYSTECTOMY  1994  . FOREARM FRACTURE SURGERY Left 1980  . FRACTURE SURGERY    . RIGHT HEART CATHETERIZATION N/A 05/30/2014   Procedure: RIGHT HEART CATH;  Surgeon: Jolaine Artist, MD;  Location: [facility] CATH LAB;  Service: Cardiovascular;  Laterality: N/A;  . RIGHT HEART CATHETERIZATION N/A 02/07/2015   Procedure: RIGHT HEART CATH;  Surgeon: Jolaine Artist, MD;  Location: [facility] CATH LAB;  Service: Cardiovascular;  Laterality: N/A;  . TONSILLECTOMY AND ADENOIDECTOMY  ~ 1998  . VIDEO BRONCHOSCOPY WITH ENDOBRONCHIAL ULTRASOUND N/A 12/28/2019   Procedure: VIDEO BRONCHOSCOPY WITH ENDOBRONCHIAL ULTRASOUND;  Surgeon: Garner Nash, DO;  Location: [facility];  Service: Thoracic;  Laterality: N/A;    REVIEW OF SYSTEMS:  A comprehensive review of systems was negative except for: Constitutional: positive for fatigue Gastrointestinal: positive for nausea Neurological: positive for coordination problems and dizziness   PHYSICAL  EXAMINATION: General appearance: alert, cooperative and no distress Head: Normocephalic, without obvious abnormality, atraumatic Neck: no adenopathy, no JVD, supple, symmetrical, trachea midline and thyroid not enlarged, symmetric, no tenderness/mass/nodules Lymph nodes: Cervical, supraclavicular, and axillary nodes normal. Resp: clear to auscultation bilaterally Back: symmetric, no curvature. ROM normal. No CVA tenderness. Cardio: regular rate and rhythm, S1, S2 normal, no murmur, click, rub or gallop GI: soft, non-tender; bowel sounds normal; no masses,  no organomegaly Extremities: extremities normal, atraumatic, no cyanosis or edema  ECOG PERFORMANCE STATUS: 1 - Symptomatic but completely ambulatory  Blood pressure 102/71, pulse 91, temperature 97.8 F (36.6 C), temperature source Temporal, resp. rate 18, height 5\' 6"  (1.676 m), weight 152 lb 9.6 oz (69.2 kg), SpO2 100 %.  LABORATORY DATA: Lab Results  Component Value Date   WBC 13.4 (H)  04/03/2020   HGB 10.1 (L) 04/03/2020   HCT 32.3 (L) 04/03/2020   MCV 102.2 (H) 04/03/2020   PLT 241 04/03/2020      Chemistry      Component Value Date/Time   NA 138 04/03/2020 1017   K 4.9 04/03/2020 1017   CL 104 04/03/2020 1017   CO2 24 04/03/2020 1017   BUN 21 (H) 04/03/2020 1017   CREATININE 1.38 (H) 04/03/2020 1017      Component Value Date/Time   CALCIUM 9.1 04/03/2020 1017   ALKPHOS 143 (H) 04/03/2020 1017   AST 12 (L) 04/03/2020 1017   ALT 17 04/03/2020 1017   BILITOT 0.3 04/03/2020 1017       RADIOGRAPHIC STUDIES: CT Chest W Contrast  Result Date: 03/13/2020 CLINICAL DATA:  Primary Cancer Type: Lung Imaging Indication: Assess response to therapy Interval therapy since last imaging? Yes Initial Cancer Diagnosis Date: 12/28/2019; established by: Biopsy-proven Detailed Pathology: Extensive stage small cell lung cancer. Primary Tumor location: Right middle lobe Chemotherapy: Yes; Ongoing? Yes; Most recent administration: 02/21/2020 Immunotherapy?  Yes; type: Imfnizi; Ongoing? Yes Radiation therapy? Yes; Date Range: 01/04/2020-01/17/2020; Target: Brain EXAM: CT CHEST, ABDOMEN, AND PELVIS WITH CONTRAST TECHNIQUE: Multidetector CT imaging of the chest, abdomen and pelvis was performed following the standard protocol during bolus administration of intravenous contrast. CONTRAST:  03/18/2020 OMNIPAQUE IOHEXOL 300 MG/ML  SOLN COMPARISON:  Most recent CT abdomen and pelvis 12/23/2019. 01/17/2020 PET-CT. FINDINGS: CT CHEST FINDINGS Cardiovascular: Top-normal heart size. Stable configuration of 2 lead left subclavian ICD with lead tips in the right atrium and right ventricular apex. No significant pericardial effusion/thickening. Left anterior descending and left circumflex coronary atherosclerosis. Atherosclerotic nonaneurysmal thoracic aorta. Normal caliber pulmonary arteries. No central pulmonary emboli. Mediastinum/Nodes: No discrete thyroid nodules. Unremarkable esophagus. No axillary  adenopathy. Previously mildly enlarged 1.1 cm AP window node is decreased to 0.9 cm (series 2/image 27). No new or residual pathologically enlarged mediastinal nodes. No pathologically enlarged hilar lymph nodes. Lungs/Pleura: No pneumothorax. No pleural effusion. Moderate centrilobular and paraseptal emphysema with diffuse bronchial wall thickening. Spiculated posterior right middle lobe 1.4 x 0.7 cm pulmonary nodule (series 6/image 109), previously 1.5 x 0.9 cm, slightly decreased. No acute consolidative airspace disease, lung masses or new significant pulmonary nodules. Stable small parenchymal band at the left lung base. Musculoskeletal: No aggressive appearing focal osseous lesions. Symmetric mild-to-moderate bilateral gynecomastia, stable. CT ABDOMEN PELVIS FINDINGS Hepatobiliary: Normal liver with no liver mass. Cholecystectomy. No biliary ductal dilatation. Pancreas: Normal, with no mass or duct dilation. Spleen: Normal size. No mass. Adrenals/Urinary Tract: Stable 3.1 cm right adrenal nodule, characterized as an adenoma on recent PET-CT. Normal left adrenal. No hydronephrosis. Subcentimeter  hypodense upper right renal cortical lesions are too small to characterize and are unchanged. No new renal lesions. Normal bladder. Stomach/Bowel: Normal non-distended stomach. Normal caliber small bowel with no small bowel wall thickening. Normal appendix. Normal large bowel with no diverticulosis, large bowel wall thickening or pericolonic fat stranding. Vascular/Lymphatic: Atherosclerotic nonaneurysmal abdominal aorta. Patent portal, splenic, hepatic and renal veins. No pathologically enlarged lymph nodes in the abdomen or pelvis. Reproductive: Top-normal size prostate. Other: No pneumoperitoneum, ascites or focal fluid collection. Musculoskeletal: No aggressive appearing focal osseous lesions. Moderate lower lumbar spondylosis. IMPRESSION: 1. Spiculated right middle lobe pulmonary nodule is slightly decreased. 2. AP  window adenopathy is decreased. 3. No new or progressive metastatic disease in the chest, abdomen or pelvis. 4. Aortic Atherosclerosis (ICD10-I70.0) and Emphysema (ICD10-J43.9). Electronically Signed   By: Delbert Phenix M.D.   On: 03/13/2020 11:46   CT Abdomen Pelvis W Contrast  Result Date: 03/13/2020 CLINICAL DATA:  Primary Cancer Type: Lung Imaging Indication: Assess response to therapy Interval therapy since last imaging? Yes Initial Cancer Diagnosis Date: 12/28/2019; established by: Biopsy-proven Detailed Pathology: Extensive stage small cell lung cancer. Primary Tumor location: Right middle lobe Chemotherapy: Yes; Ongoing? Yes; Most recent administration: 02/21/2020 Immunotherapy?  Yes; type: Imfnizi; Ongoing? Yes Radiation therapy? Yes; Date Range: 01/04/2020-01/17/2020; Target: Brain EXAM: CT CHEST, ABDOMEN, AND PELVIS WITH CONTRAST TECHNIQUE: Multidetector CT imaging of the chest, abdomen and pelvis was performed following the standard protocol during bolus administration of intravenous contrast. CONTRAST:  OMNIPAQUE IOHEXOL 300 MG/ML  SOLN COMPARISON:  Most recent CT abdomen and pelvis 12/23/2019. 01/17/2020 PET-CT. FINDINGS: CT CHEST FINDINGS Cardiovascular: Top-normal heart size. Stable configuration of 2 lead left subclavian ICD with lead tips in the right atrium and right ventricular apex. No significant pericardial effusion/thickening. Left anterior descending and left circumflex coronary atherosclerosis. Atherosclerotic nonaneurysmal thoracic aorta. Normal caliber pulmonary arteries. No central pulmonary emboli. Mediastinum/Nodes: No discrete thyroid nodules. Unremarkable esophagus. No axillary adenopathy. Previously mildly enlarged 1.1 cm AP window node is decreased to 0.9 cm (series 2/image 27). No new or residual pathologically enlarged mediastinal nodes. No pathologically enlarged hilar lymph nodes. Lungs/Pleura: No pneumothorax. No pleural effusion. Moderate centrilobular and paraseptal  emphysema with diffuse bronchial wall thickening. Spiculated posterior right middle lobe 1.4 x 0.7 cm pulmonary nodule (series 6/image 109), previously 1.5 x 0.9 cm, slightly decreased. No acute consolidative airspace disease, lung masses or new significant pulmonary nodules. Stable small parenchymal band at the left lung base. Musculoskeletal: No aggressive appearing focal osseous lesions. Symmetric mild-to-moderate bilateral gynecomastia, stable. CT ABDOMEN PELVIS FINDINGS Hepatobiliary: Normal liver with no liver mass. Cholecystectomy. No biliary ductal dilatation. Pancreas: Normal, with no mass or duct dilation. Spleen: Normal size. No mass. Adrenals/Urinary Tract: Stable 3.1 cm right adrenal nodule, characterized as an adenoma on recent PET-CT. Normal left adrenal. No hydronephrosis. Subcentimeter hypodense upper right renal cortical lesions are too small to characterize and are unchanged. No new renal lesions. Normal bladder. Stomach/Bowel: Normal non-distended stomach. Normal caliber small bowel with no small bowel wall thickening. Normal appendix. Normal large bowel with no diverticulosis, large bowel wall thickening or pericolonic fat stranding. Vascular/Lymphatic: Atherosclerotic nonaneurysmal abdominal aorta. Patent portal, splenic, hepatic and renal veins. No pathologically enlarged lymph nodes in the abdomen or pelvis. Reproductive: Top-normal size prostate. Other: No pneumoperitoneum, ascites or focal fluid collection. Musculoskeletal: No aggressive appearing focal osseous lesions. Moderate lower lumbar spondylosis. IMPRESSION: 1. Spiculated right middle lobe pulmonary nodule is slightly decreased. 2. AP window adenopathy is decreased. 3. No new or  progressive metastatic disease in the chest, abdomen or pelvis. 4. Aortic Atherosclerosis (ICD10-I70.0) and Emphysema (ICD10-J43.9). Electronically Signed   By: Ilona Sorrel M.D.   On: 03/13/2020 11:46   CUP PACEART REMOTE DEVICE CHECK  Result Date:  04/02/2020 Scheduled remote reviewed. Normal device function.  Estimated 6.5 months remaining until ERI. Wireless transmitting. Will route to triage FYI. 996 AMS episodes, longest 4 min 8 sec. Available EGMs appear recurrent atrial lead noise. Atrial lead impedance trend stable; this transmission, 260 ohms. Next remote TBD triage. Felisa Bonier, RN, MSN   ASSESSMENT AND PLAN: This is a very pleasant 59 years old African-American male recently diagnosed with extensive stage small cell lung cancer presented with spiculated right middle lobe pulmonary nodule in addition to mediastinal lymphadenopathy and multiple metastatic brain lesions.  The patient is status post whole brain irradiation. He is currently undergoing systemic chemotherapy with carboplatin, etoposide and Imfinzi status post 3 cycles. The patient has been tolerating his treatment well except for mild nausea and fatigue. I recommended for him to proceed with cycle #4 today as planned. I will see him back for follow-up visit in 3 weeks for evaluation after repeating CT scan of the chest, abdomen pelvis as well as MRI of the brain. The patient was advised to call immediately if he has any concerning symptoms in the interval. The patient voices understanding of current disease status and treatment options and is in agreement with the current care plan.  All questions were answered. The patient knows to call the clinic with any problems, questions or concerns. We can certainly see the patient much sooner if necessary.  Disclaimer: This note was dictated with voice recognition software. Similar sounding words can inadvertently be transcribed and may not be corrected upon review.

## 2020-04-03 NOTE — Patient Instructions (Signed)
[employer] Discharge Instructions for Patients Receiving Chemotherapy  Today you received the following chemotherapy agents: durvalumab, carboplatin, and etoposide.  To help prevent nausea and vomiting after your treatment, we encourage you to take your nausea medication as directed.   If you develop nausea and vomiting that is not controlled by your nausea medication, call the clinic.   BELOW ARE SYMPTOMS THAT SHOULD BE REPORTED IMMEDIATELY:  *FEVER GREATER THAN 100.5 F  *CHILLS WITH OR WITHOUT FEVER  NAUSEA AND VOMITING THAT IS NOT CONTROLLED WITH YOUR NAUSEA MEDICATION  *UNUSUAL SHORTNESS OF BREATH  *UNUSUAL BRUISING OR BLEEDING  TENDERNESS IN MOUTH AND THROAT WITH OR WITHOUT PRESENCE OF ULCERS  *URINARY PROBLEMS  *BOWEL PROBLEMS  UNUSUAL RASH Items with * indicate a potential emergency and should be followed up as soon as possible.  Feel free to call the clinic should you have any questions or concerns. The clinic phone number is [phone].  Please show the [place] at check-in to the Emergency Department and triage nurse.

## 2020-04-04 ENCOUNTER — Inpatient Hospital Stay: Payer: Medicare Other

## 2020-04-04 ENCOUNTER — Other Ambulatory Visit: Payer: Self-pay

## 2020-04-04 VITALS — BP 87/61 | HR 88 | Temp 98.0°F | Resp 18

## 2020-04-04 DIAGNOSIS — C3411 Malignant neoplasm of upper lobe, right bronchus or lung: Secondary | ICD-10-CM

## 2020-04-04 DIAGNOSIS — Z5112 Encounter for antineoplastic immunotherapy: Secondary | ICD-10-CM | POA: Diagnosis not present

## 2020-04-04 MED ORDER — SODIUM CHLORIDE 0.9% FLUSH
10.0000 mL | INTRAVENOUS | Status: DC | PRN
  Filled 2020-04-04: qty 10

## 2020-04-04 MED ORDER — SODIUM CHLORIDE 0.9 % IV SOLN
100.0000 mg/m2 | Freq: Once | INTRAVENOUS | Status: AC
  Administered 2020-04-04: 190 mg via INTRAVENOUS
  Filled 2020-04-04: qty 9.5

## 2020-04-04 MED ORDER — SODIUM CHLORIDE 0.9 % IV SOLN
Freq: Once | INTRAVENOUS | Status: AC
  Filled 2020-04-04: qty 250

## 2020-04-04 MED ORDER — HEPARIN SOD (PORK) LOCK FLUSH 100 UNIT/ML IV SOLN
500.0000 [IU] | Freq: Once | INTRAVENOUS | Status: DC | PRN
  Filled 2020-04-04: qty 5

## 2020-04-04 MED ORDER — SODIUM CHLORIDE 0.9 % IV SOLN
10.0000 mg | Freq: Once | INTRAVENOUS | Status: AC
  Administered 2020-04-04: 10 mg via INTRAVENOUS
  Filled 2020-04-04: qty 10

## 2020-04-04 NOTE — Patient Instructions (Signed)
[facility] Discharge Instructions for Patients Receiving Chemotherapy  Today you received the following chemotherapy agents etoposide  To help prevent nausea and vomiting after your treatment, we encourage you to take your nausea medication as directed by your MD    If you develop nausea and vomiting that is not controlled by your nausea medication, call the clinic.   BELOW ARE SYMPTOMS THAT SHOULD BE REPORTED IMMEDIATELY:  *FEVER GREATER THAN 100.5 F  *CHILLS WITH OR WITHOUT FEVER  NAUSEA AND VOMITING THAT IS NOT CONTROLLED WITH YOUR NAUSEA MEDICATION  *UNUSUAL SHORTNESS OF BREATH  *UNUSUAL BRUISING OR BLEEDING  TENDERNESS IN MOUTH AND THROAT WITH OR WITHOUT PRESENCE OF ULCERS  *URINARY PROBLEMS  *BOWEL PROBLEMS  UNUSUAL RASH Items with * indicate a potential emergency and should be followed up as soon as possible.  Feel free to call the clinic should you have any questions or concerns. The clinic phone number is [phone].  Please show the [place] at check-in to the Emergency Department and triage nurse.

## 2020-04-05 ENCOUNTER — Other Ambulatory Visit: Payer: Self-pay

## 2020-04-05 ENCOUNTER — Inpatient Hospital Stay: Payer: Medicare Other

## 2020-04-05 VITALS — BP 105/76 | HR 91 | Temp 98.2°F | Resp 18 | Ht 72.0 in | Wt 154.5 lb

## 2020-04-05 DIAGNOSIS — Z5112 Encounter for antineoplastic immunotherapy: Secondary | ICD-10-CM | POA: Diagnosis not present

## 2020-04-05 DIAGNOSIS — C3411 Malignant neoplasm of upper lobe, right bronchus or lung: Secondary | ICD-10-CM

## 2020-04-05 MED ORDER — SODIUM CHLORIDE 0.9 % IV SOLN
Freq: Once | INTRAVENOUS | Status: AC
  Filled 2020-04-05: qty 250

## 2020-04-05 MED ORDER — SODIUM CHLORIDE 0.9 % IV SOLN
10.0000 mg | Freq: Once | INTRAVENOUS | Status: AC
  Administered 2020-04-05: 10 mg via INTRAVENOUS
  Filled 2020-04-05: qty 10

## 2020-04-05 MED ORDER — SODIUM CHLORIDE 0.9 % IV SOLN
100.0000 mg/m2 | Freq: Once | INTRAVENOUS | Status: AC
  Administered 2020-04-05: 190 mg via INTRAVENOUS
  Filled 2020-04-05: qty 9.5

## 2020-04-05 NOTE — Patient Instructions (Signed)
[place] Discharge Instructions for Patients Receiving Chemotherapy  Today you received the following chemotherapy agents: Etoposide  To help prevent nausea and vomiting after your treatment, we encourage you to take your nausea medication as prescribed.    If you develop nausea and vomiting that is not controlled by your nausea medication, call the clinic.   BELOW ARE SYMPTOMS THAT SHOULD BE REPORTED IMMEDIATELY:  *FEVER GREATER THAN 100.5 F  *CHILLS WITH OR WITHOUT FEVER  NAUSEA AND VOMITING THAT IS NOT CONTROLLED WITH YOUR NAUSEA MEDICATION  *UNUSUAL SHORTNESS OF BREATH  *UNUSUAL BRUISING OR BLEEDING  TENDERNESS IN MOUTH AND THROAT WITH OR WITHOUT PRESENCE OF ULCERS  *URINARY PROBLEMS  *BOWEL PROBLEMS  UNUSUAL RASH Items with * indicate a potential emergency and should be followed up as soon as possible.  Feel free to call the clinic should you have any questions or concerns. The clinic phone number is [phone].  Please show the [place] at check-in to the Emergency Department and triage nurse.

## 2020-04-05 NOTE — Progress Notes (Signed)
Remote ICD transmission.   

## 2020-04-06 ENCOUNTER — Telehealth: Payer: Self-pay | Admitting: Internal Medicine

## 2020-04-06 ENCOUNTER — Encounter: Payer: Self-pay | Admitting: *Deleted

## 2020-04-06 ENCOUNTER — Ambulatory Visit (INDEPENDENT_AMBULATORY_CARE_PROVIDER_SITE_OTHER): Payer: Medicare Other | Admitting: *Deleted

## 2020-04-06 DIAGNOSIS — I428 Other cardiomyopathies: Secondary | ICD-10-CM

## 2020-04-06 DIAGNOSIS — Z7901 Long term (current) use of anticoagulants: Secondary | ICD-10-CM | POA: Diagnosis not present

## 2020-04-06 LAB — POCT INR: INR: 2.7 (ref 2.0–3.0)

## 2020-04-06 NOTE — Telephone Encounter (Signed)
Scheduled per 6/25 sch message. Unable to reach pt. Left appt time and date on 7/20

## 2020-04-06 NOTE — Progress Notes (Signed)
Patient has an appt for his follow up scan and lab work but does not have a follow up with Dr. Julien Nordmann.  I will notified scheduling give him an appt on 05/01/20.

## 2020-04-06 NOTE — Telephone Encounter (Signed)
Scheduled per los. Called and spoke with patient. Confirmed appt 

## 2020-04-06 NOTE — Patient Instructions (Signed)
Description   Continue taking 1/2 tablet every day except 1 tablet on Tuesday, Thursday, and Saturdays. Recheck in 3 weeks.  Main # [phone] Coumadin Clinic # [phone].

## 2020-04-07 ENCOUNTER — Inpatient Hospital Stay: Payer: Medicare Other

## 2020-04-07 ENCOUNTER — Other Ambulatory Visit: Payer: Self-pay

## 2020-04-07 VITALS — BP 106/62 | HR 99 | Temp 98.3°F | Resp 18

## 2020-04-07 DIAGNOSIS — Z5112 Encounter for antineoplastic immunotherapy: Secondary | ICD-10-CM | POA: Diagnosis not present

## 2020-04-07 DIAGNOSIS — C3411 Malignant neoplasm of upper lobe, right bronchus or lung: Secondary | ICD-10-CM

## 2020-04-07 MED ORDER — PEGFILGRASTIM-CBQV 6 MG/0.6ML ~~LOC~~ SOSY
6.0000 mg | PREFILLED_SYRINGE | Freq: Once | SUBCUTANEOUS | Status: AC
  Administered 2020-04-07: 6 mg via SUBCUTANEOUS

## 2020-04-07 NOTE — Patient Instructions (Signed)

## 2020-04-10 ENCOUNTER — Inpatient Hospital Stay: Payer: Medicare Other

## 2020-04-17 ENCOUNTER — Other Ambulatory Visit: Payer: Self-pay

## 2020-04-17 ENCOUNTER — Inpatient Hospital Stay: Payer: Medicare Other | Attending: Radiation Oncology

## 2020-04-17 DIAGNOSIS — Z79899 Other long term (current) drug therapy: Secondary | ICD-10-CM | POA: Diagnosis not present

## 2020-04-17 DIAGNOSIS — C7931 Secondary malignant neoplasm of brain: Secondary | ICD-10-CM | POA: Diagnosis not present

## 2020-04-17 DIAGNOSIS — C342 Malignant neoplasm of middle lobe, bronchus or lung: Secondary | ICD-10-CM | POA: Insufficient documentation

## 2020-04-17 DIAGNOSIS — Z5112 Encounter for antineoplastic immunotherapy: Secondary | ICD-10-CM | POA: Diagnosis not present

## 2020-04-17 DIAGNOSIS — C3490 Malignant neoplasm of unspecified part of unspecified bronchus or lung: Secondary | ICD-10-CM

## 2020-04-17 LAB — CMP (CANCER CENTER ONLY)
ALT: 19 U/L (ref 0–44)
AST: 17 U/L (ref 15–41)
Albumin: 3.5 g/dL (ref 3.5–5.0)
Alkaline Phosphatase: 185 U/L — ABNORMAL HIGH (ref 38–126)
Anion gap: 9 (ref 5–15)
BUN: 13 mg/dL (ref 6–20)
CO2: 26 mmol/L (ref 22–32)
Calcium: 9.2 mg/dL (ref 8.9–10.3)
Chloride: 103 mmol/L (ref 98–111)
Creatinine: 1.29 mg/dL — ABNORMAL HIGH (ref 0.61–1.24)
GFR, Est AFR Am: >60 mL/min (ref >60)
GFR, Estimated: >60 mL/min (ref >60)
Glucose, Bld: 371 mg/dL — ABNORMAL HIGH (ref 70–99)
Potassium: 4.4 mmol/L (ref 3.5–5.1)
Sodium: 138 mmol/L (ref 135–145)
Total Bilirubin: 0.3 mg/dL (ref 0.3–1.2)
Total Protein: 6.5 g/dL (ref 6.5–8.1)

## 2020-04-17 LAB — CBC WITH DIFFERENTIAL (CANCER CENTER ONLY)
Abs Immature Granulocytes: 1.96 10*3/uL — ABNORMAL HIGH (ref 0.00–0.07)
Basophils Absolute: 0.1 10*3/uL (ref 0.0–0.1)
Basophils Relative: 1 %
Eosinophils Absolute: 0.1 10*3/uL (ref 0.0–0.5)
Eosinophils Relative: 0 %
HCT: 31.4 % — ABNORMAL LOW (ref 39.0–52.0)
Hemoglobin: 9.8 g/dL — ABNORMAL LOW (ref 13.0–17.0)
Immature Granulocytes: 8 %
Lymphocytes Relative: 13 %
Lymphs Abs: 3 10*3/uL (ref 0.7–4.0)
MCH: 32.2 pg (ref 26.0–34.0)
MCHC: 31.2 g/dL (ref 30.0–36.0)
MCV: 103.3 fL — ABNORMAL HIGH (ref 80.0–100.0)
Monocytes Absolute: 2 10*3/uL — ABNORMAL HIGH (ref 0.1–1.0)
Monocytes Relative: 9 %
Neutro Abs: 16.2 10*3/uL — ABNORMAL HIGH (ref 1.7–7.7)
Neutrophils Relative %: 69 %
Platelet Count: 152 10*3/uL (ref 150–400)
RBC: 3.04 MIL/uL — ABNORMAL LOW (ref 4.22–5.81)
RDW: 14.6 % (ref 11.5–15.5)
WBC Count: 23.4 10*3/uL — ABNORMAL HIGH (ref 4.0–10.5)
nRBC: 0.5 % — ABNORMAL HIGH (ref 0.0–0.2)

## 2020-04-23 ENCOUNTER — Other Ambulatory Visit: Payer: Self-pay | Admitting: Physician Assistant

## 2020-04-23 ENCOUNTER — Ambulatory Visit (HOSPITAL_COMMUNITY)
Admission: RE | Admit: 2020-04-23 | Discharge: 2020-04-23 | Disposition: A | Discharge status: Home / Self Care | Payer: Medicare Other | Source: Ambulatory Visit | Attending: Internal Medicine | Admitting: Internal Medicine

## 2020-04-23 ENCOUNTER — Encounter (HOSPITAL_COMMUNITY): Payer: Self-pay

## 2020-04-23 ENCOUNTER — Other Ambulatory Visit: Payer: Self-pay

## 2020-04-23 DIAGNOSIS — C3490 Malignant neoplasm of unspecified part of unspecified bronchus or lung: Secondary | ICD-10-CM | POA: Insufficient documentation

## 2020-04-23 DIAGNOSIS — C3411 Malignant neoplasm of upper lobe, right bronchus or lung: Secondary | ICD-10-CM

## 2020-04-23 IMAGING — CT CT CHEST W/ CM
2 of 5 series · 12 of 36 positions shown, 15 images · IV contrast (OMNIPAQUE)
Comparison: [DATE]

CLINICAL DATA: Small-cell lung cancer.  Restaging.

EXAM:
CT CHEST, ABDOMEN, AND PELVIS WITH CONTRAST
TECHNIQUE: Multidetector CT imaging of the chest, abdomen and pelvis was
performed following the standard protocol during bolus
administration of intravenous contrast.
CONTRAST:  100mL OMNIPAQUE IOHEXOL 300 MG/ML  SOLN

[Series 2: cap with · axial · 0.73mm/px · z∈[+232,+777]mm · 9 of 137 slices shown, 12 images]
[im 14/137  mediastinal]
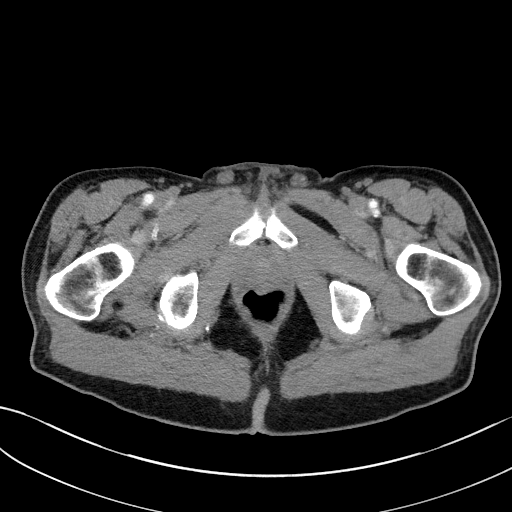
[im 14/137  lung]
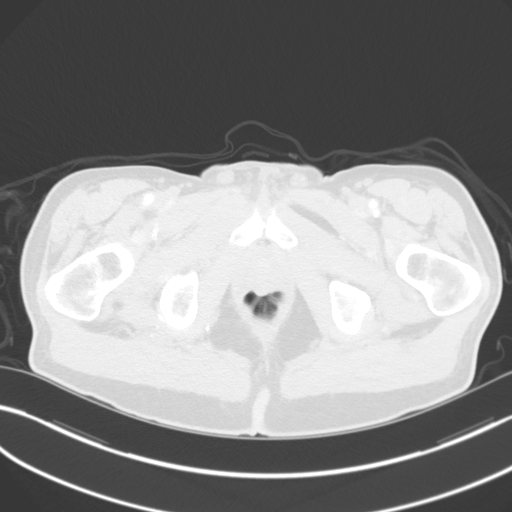
[im 28/137  lung]
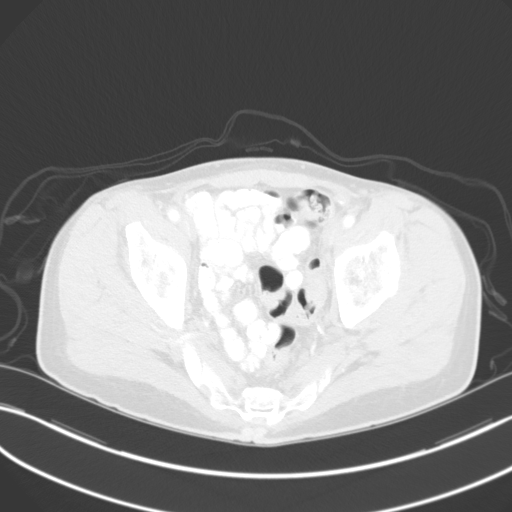
[im 41/137  lung]
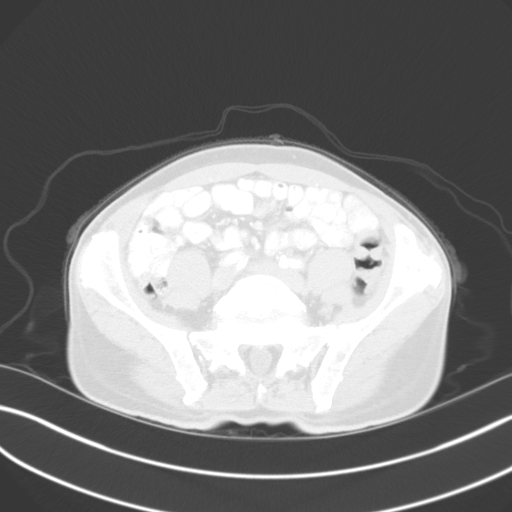
[im 55/137  lung]
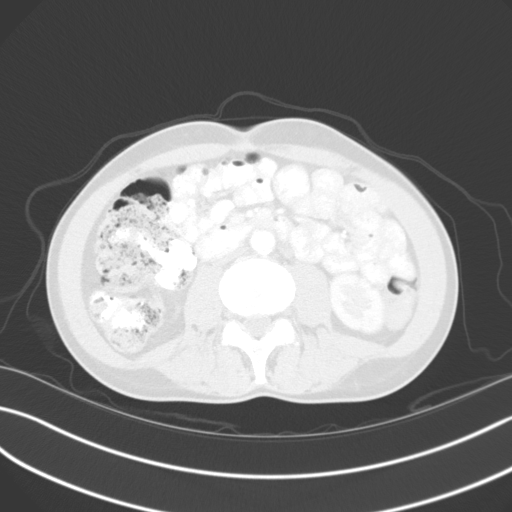
[im 69/137  mediastinal]
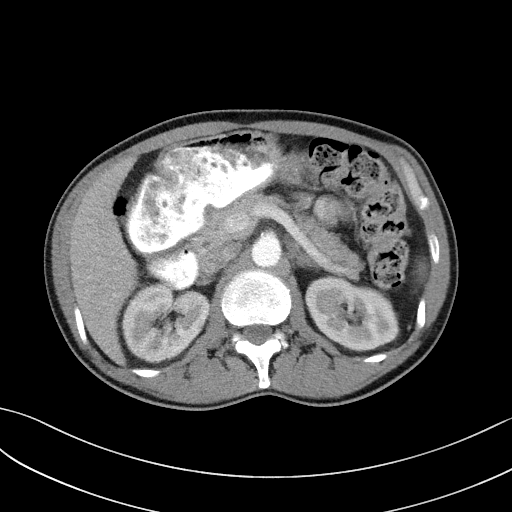
[im 69/137  lung]
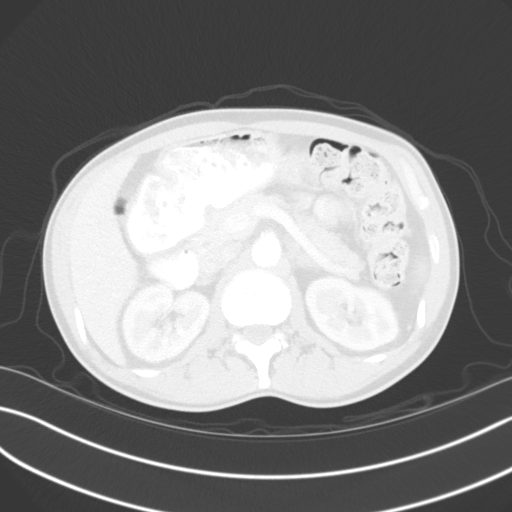
[im 82/137  lung]
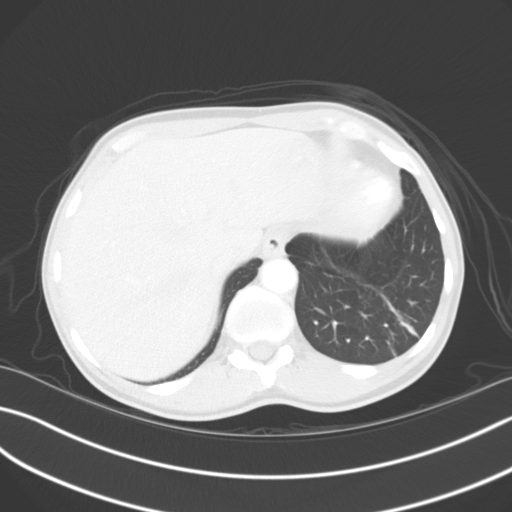
[im 96/137  lung]
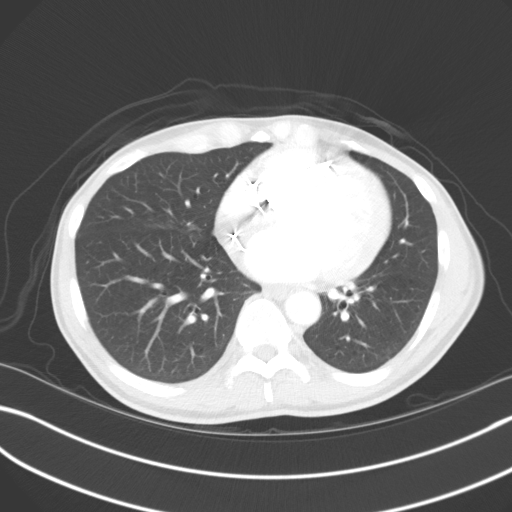
[im 109/137  lung]
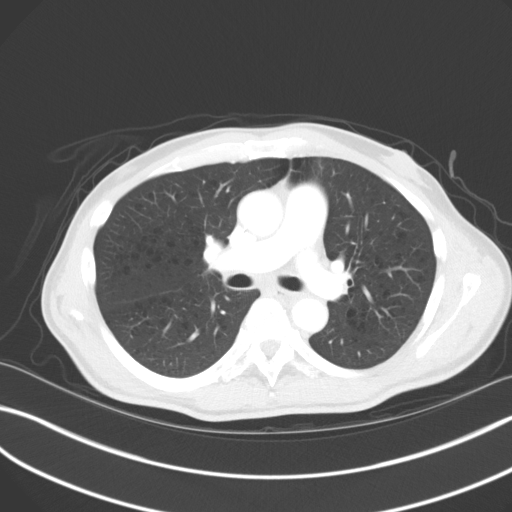
[im 123/137  mediastinal]
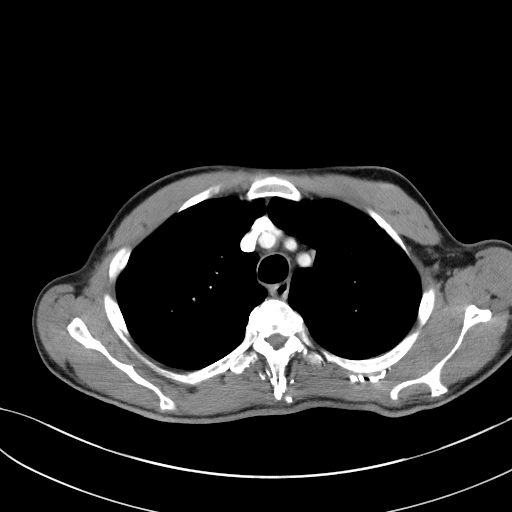
[im 123/137  lung]
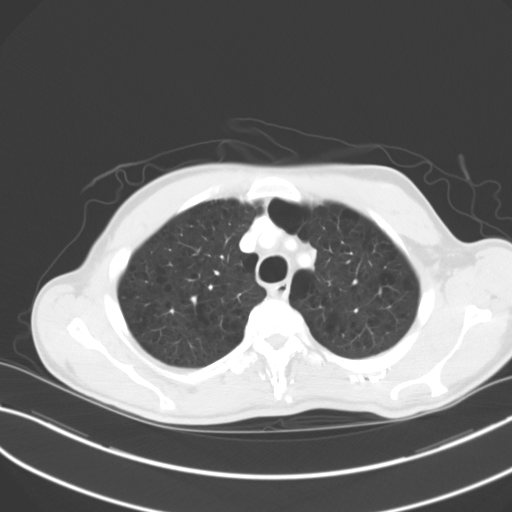

[Series 4: coronals · coronal · 0.97mm/px · 3 of 115 slices shown]
[im 23/115  lung]
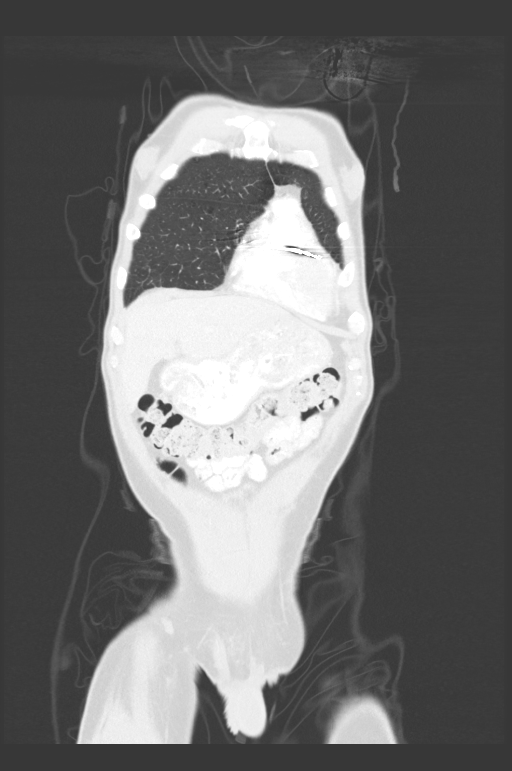
[im 46/115  lung]
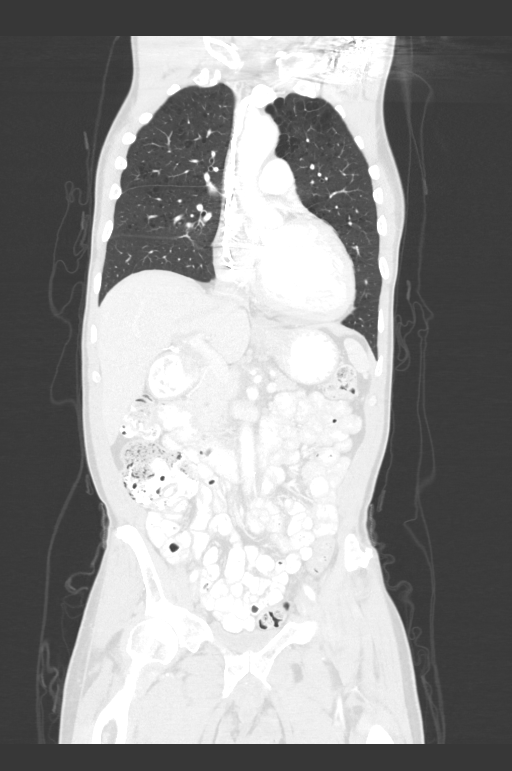
[im 69/115  lung]
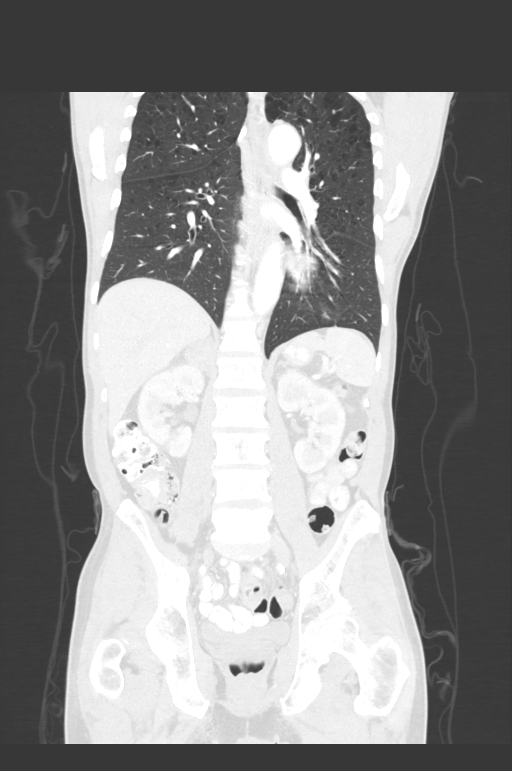

[12 of 36 positions shown; findings below may reference images not displayed]

FINDINGS: CT CHEST FINDINGS

Cardiovascular: The heart size is normal. No substantial pericardial
effusion. Coronary artery calcification is evident. Atherosclerotic
calcification is noted in the wall of the thoracic aorta. Left
permanent pacemaker.

Mediastinum/Nodes: No mediastinal lymphadenopathy. No evidence for
gross hilar lymphadenopathy although assessment is limited by the
lack of intravenous contrast on today's study. The esophagus has
normal imaging features. There is no axillary lymphadenopathy.

Lungs/Pleura: Centrilobular and paraseptal emphysema evident.
Spiculated nodule posterior right middle lobe is stable to minimally
smaller measuring 1.2 x 0.7 cm today compared to 1.4 x 0.7 cm
previously. No new suspicious nodule or mass. No focal airspace
consolidation. No pleural effusion. Chronic atelectasis or scarring
noted left posterior lower lobe

Musculoskeletal: No worrisome lytic or sclerotic osseous
abnormality.

CT ABDOMEN PELVIS FINDINGS

Hepatobiliary: No suspicious focal abnormality within the liver
parenchyma. Gallbladder is surgically absent. No intrahepatic or
extrahepatic biliary dilation.

Pancreas: No focal mass lesion. No dilatation of the main duct. No
intraparenchymal cyst. No peripancreatic edema.

Spleen: No splenomegaly. No focal mass lesion.

Adrenals/Urinary Tract: 3.1 cm adrenal nodule characterized as
adenoma on PET-CT of [DATE]. Left adrenal gland unremarkable.
Tiny probable cyst upper pole right kidney is stable. There is focal
scarring lower pole right kidney and interpolar left kidney No
evidence for hydroureter. The urinary bladder appears normal for the
degree of distention.

Stomach/Bowel: Stomach is moderately distended with food and
contrast material. Duodenum is normally positioned as is the
ligament of [place]. No small bowel wall thickening. No small bowel
dilatation. The terminal ileum is normal. The appendix is normal. No
gross colonic mass. No colonic wall thickening.

Vascular/Lymphatic: There is abdominal aortic atherosclerosis
without aneurysm. There is no gastrohepatic or hepatoduodenal
ligament lymphadenopathy. No retroperitoneal or mesenteric
lymphadenopathy. No pelvic sidewall lymphadenopathy.

Reproductive: The prostate gland and seminal vesicles are
unremarkable.

Other: No intraperitoneal free fluid.

Musculoskeletal: No worrisome lytic or sclerotic osseous
abnormality. Right-sided L5 pars interarticularis defect noted.
IMPRESSION: 1. Stable to slight decrease in size of the spiculated posterior
right middle lobe pulmonary nodule. No new or progressive findings
in the chest, abdomen, or pelvis to suggest new metastatic disease.
2. Stable right adrenal adenoma.
3. Aortic Atherosclerosis ([LS]-[LS]) and Emphysema ([LS]-[LS]).

## 2020-04-23 MED ORDER — IOHEXOL 300 MG/ML  SOLN
100.0000 mL | Freq: Once | INTRAMUSCULAR | Status: AC | PRN
  Administered 2020-04-23: 100 mL via INTRAVENOUS

## 2020-04-23 MED ORDER — SODIUM CHLORIDE (PF) 0.9 % IJ SOLN
INTRAMUSCULAR | Status: AC
  Filled 2020-04-23: qty 50

## 2020-04-24 ENCOUNTER — Inpatient Hospital Stay: Payer: Medicare Other

## 2020-04-24 ENCOUNTER — Telehealth: Payer: Self-pay | Admitting: Internal Medicine

## 2020-04-24 ENCOUNTER — Encounter: Payer: Self-pay | Admitting: Internal Medicine

## 2020-04-24 ENCOUNTER — Other Ambulatory Visit: Payer: Self-pay

## 2020-04-24 ENCOUNTER — Inpatient Hospital Stay (HOSPITAL_BASED_OUTPATIENT_CLINIC_OR_DEPARTMENT_OTHER): Payer: Medicare Other | Admitting: Internal Medicine

## 2020-04-24 VITALS — BP 82/63 | HR 92 | Temp 97.8°F | Resp 17 | Ht 72.0 in | Wt 154.2 lb

## 2020-04-24 DIAGNOSIS — C3411 Malignant neoplasm of upper lobe, right bronchus or lung: Secondary | ICD-10-CM

## 2020-04-24 DIAGNOSIS — Z5112 Encounter for antineoplastic immunotherapy: Secondary | ICD-10-CM | POA: Diagnosis not present

## 2020-04-24 LAB — CBC WITH DIFFERENTIAL (CANCER CENTER ONLY)
Abs Immature Granulocytes: 0.15 10*3/uL — ABNORMAL HIGH (ref 0.00–0.07)
Basophils Absolute: 0.1 10*3/uL (ref 0.0–0.1)
Basophils Relative: 0 %
Eosinophils Absolute: 0.1 10*3/uL (ref 0.0–0.5)
Eosinophils Relative: 1 %
HCT: 30.4 % — ABNORMAL LOW (ref 39.0–52.0)
Hemoglobin: 9.5 g/dL — ABNORMAL LOW (ref 13.0–17.0)
Immature Granulocytes: 1 %
Lymphocytes Relative: 17 %
Lymphs Abs: 2.4 10*3/uL (ref 0.7–4.0)
MCH: 32.9 pg (ref 26.0–34.0)
MCHC: 31.3 g/dL (ref 30.0–36.0)
MCV: 105.2 fL — ABNORMAL HIGH (ref 80.0–100.0)
Monocytes Absolute: 1 10*3/uL (ref 0.1–1.0)
Monocytes Relative: 7 %
Neutro Abs: 10.5 10*3/uL — ABNORMAL HIGH (ref 1.7–7.7)
Neutrophils Relative %: 74 %
Platelet Count: 263 10*3/uL (ref 150–400)
RBC: 2.89 MIL/uL — ABNORMAL LOW (ref 4.22–5.81)
RDW: 14.6 % (ref 11.5–15.5)
WBC Count: 14.1 10*3/uL — ABNORMAL HIGH (ref 4.0–10.5)
nRBC: 0 % (ref 0.0–0.2)

## 2020-04-24 LAB — CMP (CANCER CENTER ONLY)
ALT: 13 U/L (ref 0–44)
AST: 14 U/L — ABNORMAL LOW (ref 15–41)
Albumin: 3.5 g/dL (ref 3.5–5.0)
Alkaline Phosphatase: 142 U/L — ABNORMAL HIGH (ref 38–126)
Anion gap: 9 (ref 5–15)
BUN: 13 mg/dL (ref 6–20)
CO2: 22 mmol/L (ref 22–32)
Calcium: 9.2 mg/dL (ref 8.9–10.3)
Chloride: 108 mmol/L (ref 98–111)
Creatinine: 1.25 mg/dL — ABNORMAL HIGH (ref 0.61–1.24)
GFR, Est AFR Am: >60 mL/min (ref >60)
GFR, Estimated: >60 mL/min (ref >60)
Glucose, Bld: 122 mg/dL — ABNORMAL HIGH (ref 70–99)
Potassium: 4.4 mmol/L (ref 3.5–5.1)
Sodium: 139 mmol/L (ref 135–145)
Total Bilirubin: 0.3 mg/dL (ref 0.3–1.2)
Total Protein: 6.7 g/dL (ref 6.5–8.1)

## 2020-04-24 LAB — TSH: TSH: 0.453 u[IU]/mL (ref 0.320–4.118)

## 2020-04-24 MED ORDER — SODIUM CHLORIDE 0.9 % IV SOLN
1500.0000 mg | Freq: Once | INTRAVENOUS | Status: AC
  Administered 2020-04-24: 1500 mg via INTRAVENOUS
  Filled 2020-04-24: qty 30

## 2020-04-24 MED ORDER — SODIUM CHLORIDE 0.9 % IV SOLN
Freq: Once | INTRAVENOUS | Status: AC
  Filled 2020-04-24: qty 250

## 2020-04-24 NOTE — Addendum Note (Signed)
Addended by: Ardeen Garland on: 04/24/2020 12:55 PM   Modules accepted: Orders

## 2020-04-24 NOTE — Patient Instructions (Signed)
[facility] Discharge Instructions for Patients Receiving Chemotherapy  Today you received the following immunotherapy agent: Imfinzi  To help prevent nausea and vomiting after your treatment, we encourage you to take your nausea medication as directed by your MD.   If you develop nausea and vomiting that is not controlled by your nausea medication, call the clinic.   BELOW ARE SYMPTOMS THAT SHOULD BE REPORTED IMMEDIATELY:  *FEVER GREATER THAN 100.5 F  *CHILLS WITH OR WITHOUT FEVER  NAUSEA AND VOMITING THAT IS NOT CONTROLLED WITH YOUR NAUSEA MEDICATION  *UNUSUAL SHORTNESS OF BREATH  *UNUSUAL BRUISING OR BLEEDING  TENDERNESS IN MOUTH AND THROAT WITH OR WITHOUT PRESENCE OF ULCERS  *URINARY PROBLEMS  *BOWEL PROBLEMS  UNUSUAL RASH Items with * indicate a potential emergency and should be followed up as soon as possible.  Feel free to call the clinic should you have any questions or concerns. The clinic phone number is [phone].  Please show the [facility] at check-in to the Emergency Department and triage nurse.  Durvalumab injection What is this medicine? DURVALUMAB (dur VAL ue mab) is a monoclonal antibody. It is used to treat urothelial cancer and lung cancer. This medicine may be used for other purposes; ask your health care provider or pharmacist if you have questions. COMMON BRAND NAME(S): IMFINZI What should I tell my health care provider before I take this medicine? They need to know if you have any of these conditions:  diabetes  immune system problems  infection  inflammatory bowel disease  kidney disease  liver disease  lung or breathing disease  lupus  organ transplant  stomach or intestine problems  thyroid disease  an unusual or allergic reaction to durvalumab, other medicines, foods, dyes, or preservatives  pregnant or trying to get pregnant  breast-feeding How should I use this medicine? This medicine is for  infusion into a vein. It is given by a health care professional in a hospital or clinic setting. A special MedGuide will be given to you before each treatment. Be sure to read this information carefully each time. Talk to your pediatrician regarding the use of this medicine in children. Special care may be needed. Overdosage: If you think you have taken too much of this medicine contact a poison control center or emergency room at once. NOTE: This medicine is only for you. Do not share this medicine with others. What if I miss a dose? It is important not to miss your dose. Call your doctor or health care professional if you are unable to keep an appointment. What may interact with this medicine? Interactions have not been studied. This list may not describe all possible interactions. Give your health care provider a list of all the medicines, herbs, non-prescription drugs, or dietary supplements you use. Also tell them if you smoke, drink alcohol, or use illegal drugs. Some items may interact with your medicine. What should I watch for while using this medicine? This drug may make you feel generally unwell. Continue your course of treatment even though you feel ill unless your doctor tells you to stop. You may need blood work done while you are taking this medicine. Do not become pregnant while taking this medicine or for 3 months after stopping it. Women should inform their doctor if they wish to become pregnant or think they might be pregnant. There is a potential for serious side effects to an unborn child. Talk to your health care professional or pharmacist for more information.  Do not breast-feed an infant while taking this medicine or for 3 months after stopping it. What side effects may I notice from receiving this medicine? Side effects that you should report to your doctor or health care professional as soon as possible:  allergic reactions like skin rash, itching or hives, swelling of the  face, lips, or tongue  black, tarry stools  bloody or watery diarrhea  breathing problems  change in emotions or moods  change in sex drive  changes in vision  chest pain or chest tightness  chills  confusion  cough  facial flushing  fever  headache  signs and symptoms of high blood sugar such as dizziness; dry mouth; dry skin; fruity breath; nausea; stomach pain; increased hunger or thirst; increased urination  signs and symptoms of liver injury like dark yellow or brown urine; general ill feeling or flu-like symptoms; light-colored stools; loss of appetite; nausea; right upper belly pain; unusually weak or tired; yellowing of the eyes or skin  stomach pain  trouble passing urine or change in the amount of urine  weight gain or weight loss Side effects that usually do not require medical attention (report these to your doctor or health care professional if they continue or are bothersome):  bone pain  constipation  loss of appetite  muscle pain  nausea  swelling of the ankles, feet, hands  tiredness This list may not describe all possible side effects. Call your doctor for medical advice about side effects. You may report side effects to FDA at 1-800-FDA-1088. Where should I keep my medicine? This drug is given in a hospital or clinic and will not be stored at home. NOTE: This sheet is a summary. It may not cover all possible information. If you have questions about this medicine, talk to your doctor, pharmacist, or health care provider.  2020 [employer]/Gold Standard (2016-12-09 19:25:04)

## 2020-04-24 NOTE — Progress Notes (Signed)
[facility] [phone]   [phone]  OFFICE PROGRESS NOTE  [name], MD [place] [address]  DIAGNOSIS: Extensive stage small cell lung cancer. He presented with a spiculated right middle lobe pulmonary nodule and mildly enlargedsolitary aortopulmonary lymph node. 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 He was diagnosed in March 2021.  PRIOR THERAPY: 1) Whole brain radiation under the care of Dr. Lisbeth Renshaw. Completed 01/17/2020  CURRENT THERAPY: Systemic chemotherapy withcarboplatin for an AUC of 5 on day 1,etoposide 100 mg/m on days 1, 2, and 3, and Imfinzi 1500 mg on day 1 IV every 3 weeks. First dose expected on4/20/21. Status post 4 cycles.  Starting from cycle #5 the patient will be treated with maintenance Imfinzi 1500 mg IV every 4 weeks.  INTERVAL HISTORY: Dennis [name]. 59 y.o. male returns to the clinic today for follow-up visit.  The patient is feeling fine today with no concerning complaints.  He has few episodes of nausea last week but completely resolved.  He denied having any current chest pain, shortness of breath, cough or hemoptysis.  He denied having any fever or chills.  He has no nausea, vomiting, diarrhea or constipation.  He denied having any headache or visual changes.  The patient tolerated the last cycle of his treatment well except for the delayed nausea.  He had repeat CT scan of the chest, abdomen pelvis performed recently and he is here for evaluation and discussion of his discuss results.  MEDICAL HISTORY: Past Medical History:  Diagnosis Date  . Alcohol abuse with alcohol-induced mood disorder ([place]) 08/18/2015  . Automatic implantable cardioverter-defibrillator in situ 2012   S/P St Jude Dual Chamber. ICD.  Marland Kitchen CAD (coronary artery disease) 11/16/2014   CAD Coronary angiography (12/2008) with mid LAD totally occluded and  collateralized.  . Cardiac LV ejection fraction 10-20%   . Chest pain 09/04/2018  . CHF (congestive heart failure) ([place])   . Chronic systolic heart failure (HCC)    a) Mixed ICM/NICM b) RHC (05/2014): RA 2, RV 19/2/3, PA 22/14 (18), PCWP 6, Fick CO/CI: 5.2 / 2.7, PVR 2.3 WU, PA 60% and 64% c) ECHO (05/2014): EF 20-25%, diff HK, akinesis entireanteroseptal myocardium, triv AI, mod MR, LA mod/sev dilated  . Cocaine abuse ([place]) 01/24/2015  . Cocaine abuse with cocaine-induced mood disorder ([place]) 08/20/2015  . Drug abuse and dependence ([place])   . Dysphagia following cerebral infarction 01/24/2015  . Embolic stroke involving right middle cerebral artery ([place])   . Extensive stage primary small cell carcinoma of lung ([place]) 01/04/2020  . H/O noncompliance with medical treatment, presenting hazards to health 01/24/2015  . Heart murmur   . High cholesterol   . Ischemic cardiomyopathy    a) Coronary angiography (12/2008) at [facility]: L[place]: nl, LAD mid 100% stenosis with left to left and right-to-left collaterals to the distal LAD; Lcx: nl, RCA nl.    . 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[place])   . MDD (major depressive disorder), recurrent severe, without psychosis ([place]) 08/18/2015  . Open wound of foot 02/27/2015  . Pneumonia 1988  . Psychoactive substance-induced mood disorder ([place]) 07/17/2015  . Sleep apnea    "cleared after T&A"  . Status post PICC central line placement   . Stroke ([place])   . Substance induced mood disorder ([place]) 08/17/2015  . Type II diabetes mellitus (HCC)     ALLERGIES:  has  No Known Allergies.  MEDICATIONS:  Current Outpatient Medications  Medication Sig Dispense Refill  . Accu-Chek Softclix Lancets lancets     . atorvastatin (LIPITOR) 20 MG tablet TAKE 1 TABLET BY MOUTH DAILY AT 6 PM. (Patient taking differently: Take 20 mg by mouth daily at 6 PM. ) 30 tablet 6  . Blood Glucose Monitoring Suppl (FIFTY50 GLUCOSE METER 2.0) w/Device KIT Use as instructed    . carvedilol (COREG) 6.25 MG  tablet Take 1.5 tablets (9.375 mg total) by mouth 2 (two) times daily with a meal. 90 tablet 3  . dexamethasone (DECADRON) 4 MG tablet Take 1 tablet (4 mg total) by mouth 2 (two) times daily. Please also instruct pt on taking omeprazole once daily while taking steroids (Patient not taking: Reported on 02/07/2020) 60 tablet 0  . empagliflozin (JARDIANCE) 10 MG TABS tablet Take 10 mg by mouth daily before breakfast. 30 tablet 6  . Insulin Glargine (LANTUS SOLOSTAR) 100 UNIT/ML Solostar Pen Inject 36 Units into the skin daily at 10 pm.     . INSUPEN ULTRAFIN 31G X 8 MM MISC     . memantine (NAMENDA) 5 MG tablet Start on 1st day of radiation. Week 1: take one po daily. Week 2: take one po twice daily. Week 3: take two po qam, and one po q pm. Week 4: take 2 po twice daily for remaining 20 weeks. 630 tablet 0  . metFORMIN (GLUCOPHAGE-XR) 500 MG 24 hr tablet Take 1,000 mg by mouth daily with breakfast. Per [facility] pharmacist    . ondansetron (ZOFRAN) 8 MG tablet Take 1 tablet (8 mg total) by mouth every 8 (eight) hours as needed for nausea or vomiting. 20 tablet 0  . pantoprazole (PROTONIX) 40 MG tablet Take 1 tablet by mouth daily.    . prochlorperazine (COMPAZINE) 10 MG tablet Take 1 tablet (10 mg total) by mouth every 6 (six) hours as needed. 30 tablet 2  . sacubitril-valsartan (ENTRESTO) 24-26 MG Take 1 tablet by mouth 2 (two) times daily. 60 tablet 3  . spironolactone (ALDACTONE) 25 MG tablet Take 1 tablet (25 mg total) by mouth daily. 90 tablet 3  . warfarin (COUMADIN) 7.5 MG tablet TAKE 1 TABLET BY MOUTH DAILY AT 6PM. 30 tablet 0   No current facility-administered medications for this visit.    SURGICAL HISTORY:  Past Surgical History:  Procedure Laterality Date  . BRONCHIAL BRUSHINGS  12/28/2019   Procedure: BRONCHIAL BRUSHINGS;  Surgeon: Garner Nash, DO;  Location: [facility];  Service: Thoracic;;  . BRONCHIAL NEEDLE ASPIRATION BIOPSY  12/28/2019   Procedure: BRONCHIAL NEEDLE ASPIRATION  BIOPSIES;  Surgeon: Garner Nash, DO;  Location: [facility];  Service: Thoracic;;  . BRONCHIAL WASHINGS  12/28/2019   Procedure: BRONCHIAL WASHINGS;  Surgeon: Garner Nash, DO;  Location: [facility];  Service: Thoracic;;  . CARDIAC CATHETERIZATION  05/2014  . CARDIAC DEFIBRILLATOR PLACEMENT  03/2011  . CHOLECYSTECTOMY  1994  . FOREARM FRACTURE SURGERY Left 1980  . FRACTURE SURGERY    . RIGHT HEART CATHETERIZATION N/A 05/30/2014   Procedure: RIGHT HEART CATH;  Surgeon: Jolaine Artist, MD;  Location: [facility] C[facility] LAB;  Service: Cardiovascular;  Laterality: N/A;  . RIGHT HEART CATHETERIZATION N/A 02/07/2015   Procedure: RIGHT HEART CATH;  Surgeon: Jolaine Artist, MD;  Location: [facility] CATH LAB;  Service: Cardiovascular;  Laterality: N/A;  . TONSILLECTOMY AND ADENOIDECTOMY  ~ 1998  . VIDEO BRONCHOSCOPY WITH ENDOBRONCHIAL ULTRASOUND N/A 12/28/2019   Procedure: VIDEO BRONCHOSCOPY WITH  ENDOBRONCHIAL ULTRASOUND;  Surgeon: Garner Nash, DO;  Location: [facility];  Service: Thoracic;  Laterality: N/A;    REVIEW OF SYSTEMS:  Constitutional: positive for fatigue Eyes: negative Ears, nose, mouth, throat, and face: negative Respiratory: negative Cardiovascular: negative Gastrointestinal: negative Genitourinary:negative Integument/breast: negative Hematologic/lymphatic: negative Musculoskeletal:negative Neurological: negative Behavioral/Psych: negative Endocrine: negative Allergic/Immunologic: negative   PHYSICAL EXAMINATION: General appearance: alert, cooperative and no distress Head: Normocephalic, without obvious abnormality, atraumatic Neck: no adenopathy, no JVD, supple, symmetrical, trachea midline and thyroid not enlarged, symmetric, no tenderness/mass/nodules Lymph nodes: Cervical, supraclavicular, and axillary nodes normal. Resp: clear to auscultation bilaterally Back: symmetric, no curvature. ROM normal. No CVA tenderness. Cardio: regular rate and rhythm, S1, S2 normal, no  murmur, click, rub or gallop GI: soft, non-tender; bowel sounds normal; no masses,  no organomegaly Extremities: extremities normal, atraumatic, no cyanosis or edema Neurologic: Alert and oriented X 3, normal strength and tone. Normal symmetric reflexes. Normal coordination and gait  ECOG PERFORMANCE STATUS: 1 - Symptomatic but completely ambulatory  Blood pressure (!) 82/63, pulse 92, temperature 97.8 F (36.6 C), temperature source Temporal, resp. rate 17, height 6' (1.829 m), weight 154 lb 3.2 oz (69.9 kg), SpO2 100 %.  LABORATORY DATA: Lab Results  Component Value Date   WBC 23.4 (H) 04/17/2020   HGB 9.8 (L) 04/17/2020   HCT 31.4 (L) 04/17/2020   MCV 103.3 (H) 04/17/2020   PLT 152 04/17/2020      Chemistry      Component Value Date/Time   NA 138 04/17/2020 1058   K 4.4 04/17/2020 1058   CL 103 04/17/2020 1058   CO2 26 04/17/2020 1058   BUN 13 04/17/2020 1058   CREATININE 1.29 (H) 04/17/2020 1058      Component Value Date/Time   CALCIUM 9.2 04/17/2020 1058   ALKPHOS 185 (H) 04/17/2020 1058   AST 17 04/17/2020 1058   ALT 19 04/17/2020 1058   BILITOT 0.3 04/17/2020 1058       RADIOGRAPHIC STUDIES: CT Chest W Contrast  Result Date: 04/23/2020 CLINICAL DATA:  Small-cell lung cancer.  Restaging. EXAM: CT CHEST, ABDOMEN, AND PELVIS WITH CONTRAST TECHNIQUE: Multidetector CT imaging of the chest, abdomen and pelvis was performed following the standard protocol during bolus administration of intravenous contrast. CONTRAST:  198m OMNIPAQUE IOHEXOL 300 MG/ML  SOLN COMPARISON:  03/13/2020 FINDINGS: CT CHEST FINDINGS Cardiovascular: The heart size is normal. No substantial pericardial effusion. Coronary artery calcification is evident. Atherosclerotic calcification is noted in the wall of the thoracic aorta. Left permanent pacemaker. Mediastinum/Nodes: No mediastinal lymphadenopathy. No evidence for gross hilar lymphadenopathy although assessment is limited by the lack of  intravenous contrast on today's study. The esophagus has normal imaging features. There is no axillary lymphadenopathy. Lungs/Pleura: Centrilobular and paraseptal emphysema evident. Spiculated nodule posterior right middle lobe is stable to minimally smaller measuring 1.2 x 0.7 cm today compared to 1.4 x 0.7 cm previously. No new suspicious nodule or mass. No focal airspace consolidation. No pleural effusion. Chronic atelectasis or scarring noted left posterior lower lobe Musculoskeletal: No worrisome lytic or sclerotic osseous abnormality. CT ABDOMEN PELVIS FINDINGS Hepatobiliary: No suspicious focal abnormality within the liver parenchyma. Gallbladder is surgically absent. No intrahepatic or extrahepatic biliary dilation. Pancreas: No focal mass lesion. No dilatation of the main duct. No intraparenchymal cyst. No peripancreatic edema. Spleen: No splenomegaly. No focal mass lesion. Adrenals/Urinary Tract: 3.1 cm adrenal nodule characterized as adenoma on PET-CT of 01/17/2020. Left adrenal gland unremarkable. Tiny probable cyst upper pole right kidney is  stable. There is focal scarring lower pole right kidney and interpolar left kidney No evidence for hydroureter. The urinary bladder appears normal for the degree of distention. Stomach/Bowel: Stomach is moderately distended with food and contrast material. Duodenum is normally positioned as is the ligament of Treitz. No small bowel wall thickening. No small bowel dilatation. The terminal ileum is normal. The appendix is normal. No gross colonic mass. No colonic wall thickening. Vascular/Lymphatic: There is abdominal aortic atherosclerosis without aneurysm. There is no gastrohepatic or hepatoduodenal ligament lymphadenopathy. No retroperitoneal or mesenteric lymphadenopathy. No pelvic sidewall lymphadenopathy. Reproductive: The prostate gland and seminal vesicles are unremarkable. Other: No intraperitoneal free fluid. Musculoskeletal: No worrisome lytic or sclerotic  osseous abnormality. Right-sided L5 pars interarticularis defect noted. IMPRESSION: 1. Stable to slight decrease in size of the spiculated posterior right middle lobe pulmonary nodule. No new or progressive findings in the chest, abdomen, or pelvis to suggest new metastatic disease. 2. Stable right adrenal adenoma. 3. Aortic Atherosclerosis (ICD10-I70.0) and Emphysema (ICD10-J43.9). Electronically Signed   By: [name] M.D.   On: 04/23/2020 09:46   CT Abdomen Pelvis W Contrast  Result Date: 04/23/2020 CLINICAL DATA:  Small-cell lung cancer.  Restaging. EXAM: CT CHEST, ABDOMEN, AND PELVIS WITH CONTRAST TECHNIQUE: Multidetector CT imaging of the chest, abdomen and pelvis was performed following the standard protocol during bolus administration of intravenous contrast. CONTRAST:  177m OMNIPAQUE IOHEXOL 300 MG/ML  SOLN COMPARISON:  03/13/2020 FINDINGS: CT CHEST FINDINGS Cardiovascular: The heart size is normal. No substantial pericardial effusion. Coronary artery calcification is evident. Atherosclerotic calcification is noted in the wall of the thoracic aorta. Left permanent pacemaker. Mediastinum/Nodes: No mediastinal lymphadenopathy. No evidence for gross hilar lymphadenopathy although assessment is limited by the lack of intravenous contrast on today's study. The esophagus has normal imaging features. There is no axillary lymphadenopathy. Lungs/Pleura: Centrilobular and paraseptal emphysema evident. Spiculated nodule posterior right middle lobe is stable to minimally smaller measuring 1.2 x 0.7 cm today compared to 1.4 x 0.7 cm previously. No new suspicious nodule or mass. No focal airspace consolidation. No pleural effusion. Chronic atelectasis or scarring noted left posterior lower lobe Musculoskeletal: No worrisome lytic or sclerotic osseous abnormality. CT ABDOMEN PELVIS FINDINGS Hepatobiliary: No suspicious focal abnormality within the liver parenchyma. Gallbladder is surgically absent. No  intrahepatic or extrahepatic biliary dilation. Pancreas: No focal mass lesion. No dilatation of the main duct. No intraparenchymal cyst. No peripancreatic edema. Spleen: No splenomegaly. No focal mass lesion. Adrenals/Urinary Tract: 3.1 cm adrenal nodule characterized as adenoma on PET-CT of 01/17/2020. Left adrenal gland unremarkable. Tiny probable cyst upper pole right kidney is stable. There is focal scarring lower pole right kidney and interpolar left kidney No evidence for hydroureter. The urinary bladder appears normal for the degree of distention. Stomach/Bowel: Stomach is moderately distended with food and contrast material. Duodenum is normally positioned as is the ligament of Treitz. No small bowel wall thickening. No small bowel dilatation. The terminal ileum is normal. The appendix is normal. No gross colonic mass. No colonic wall thickening. Vascular/Lymphatic: There is abdominal aortic atherosclerosis without aneurysm. There is no gastrohepatic or hepatoduodenal ligament lymphadenopathy. No retroperitoneal or mesenteric lymphadenopathy. No pelvic sidewall lymphadenopathy. Reproductive: The prostate gland and seminal vesicles are unremarkable. Other: No intraperitoneal free fluid. Musculoskeletal: No worrisome lytic or sclerotic osseous abnormality. Right-sided L5 pars interarticularis defect noted. IMPRESSION: 1. Stable to slight decrease in size of the spiculated posterior right middle lobe pulmonary nodule. No new or progressive findings in the chest, abdomen, or  pelvis to suggest new metastatic disease. 2. Stable right adrenal adenoma. 3. Aortic Atherosclerosis (ICD10-I70.0) and Emphysema (ICD10-J43.9). Electronically Signed   By: [name] M.D.   On: 04/23/2020 09:46   CUP PACEART REMOTE DEVICE CHECK  Result Date: 04/02/2020 Scheduled remote reviewed. Normal device function.  Estimated 6.5 months remaining until ERI. Wireless transmitting. Will route to triage FYI. 996 AMS episodes, longest  4 min 8 sec. Available EGMs appear recurrent atrial lead noise. Atrial lead impedance trend stable; this transmission, 260 ohms. Next remote TBD triage. Felisa Bonier, RN, MSN   ASSESSMENT AND PLAN: This is a very pleasant 59 years old African-American male recently diagnosed with extensive stage small cell lung cancer presented with spiculated right middle lobe pulmonary nodule in addition to mediastinal lymphadenopathy and multiple metastatic brain lesions.  The patient is status post whole brain irradiation. He is currently undergoing systemic chemotherapy with carboplatin, etoposide and Imfinzi status post 4 cycles.  The patient tolerated the last cycle of his treatment well except for the delayed nausea. He had repeat CT scan of the chest, abdomen pelvis performed recently.  I personally and independently reviewed the scans and discussed the results with the patient today. His scan showed no concerning findings for disease progression and there was further improvement of his disease. I recommended for him to proceed with the first cycle of maintenance treatment with Imfinzi today. I will see him back for follow-up visit in 4 weeks for evaluation before starting cycle #6. The patient was advised to call immediately if he has any concerning symptoms in the interval.  The patient voices understanding of current disease status and treatment options and is in agreement with the current care plan.  All questions were answered. The patient knows to call the clinic with any problems, questions or concerns. We can certainly see the patient much sooner if necessary.  Disclaimer: This note was dictated with voice recognition software. Similar sounding words can inadvertently be transcribed and may not be corrected upon review.

## 2020-04-24 NOTE — Telephone Encounter (Signed)
Scheduled appt per 7/13 los - pt to get an updated schedule next chemo

## 2020-05-01 ENCOUNTER — Other Ambulatory Visit: Payer: Self-pay

## 2020-05-01 ENCOUNTER — Ambulatory Visit (INDEPENDENT_AMBULATORY_CARE_PROVIDER_SITE_OTHER): Payer: Medicare Other | Admitting: *Deleted

## 2020-05-01 ENCOUNTER — Inpatient Hospital Stay (HOSPITAL_BASED_OUTPATIENT_CLINIC_OR_DEPARTMENT_OTHER): Payer: Medicare Other | Admitting: Internal Medicine

## 2020-05-01 VITALS — BP 82/70 | HR 99 | Temp 98.2°F | Resp 18 | Ht 72.0 in | Wt 150.2 lb

## 2020-05-01 DIAGNOSIS — I5022 Chronic systolic (congestive) heart failure: Secondary | ICD-10-CM

## 2020-05-01 DIAGNOSIS — I428 Other cardiomyopathies: Secondary | ICD-10-CM

## 2020-05-01 DIAGNOSIS — C3411 Malignant neoplasm of upper lobe, right bronchus or lung: Secondary | ICD-10-CM

## 2020-05-01 NOTE — Progress Notes (Signed)
This encounter was created in error - please disregard.

## 2020-05-02 LAB — CUP PACEART REMOTE DEVICE CHECK
Battery Remaining Longevity: 7 mo
Battery Remaining Percentage: 6 %
Battery Voltage: 2.63 V
Brady Statistic AP VP Percent: 1 %
Brady Statistic AP VS Percent: 1 %
Brady Statistic AS VP Percent: 1 %
Brady Statistic AS VS Percent: 99 %
Brady Statistic RA Percent Paced: 1 %
Brady Statistic RV Percent Paced: 1 %
Date Time Interrogation Session: 20210721154056
HighPow Impedance: 59 Ohm
HighPow Impedance: 59 Ohm
Implantable Lead Implant Date: 20110513
Implantable Lead Implant Date: 20110513
Implantable Lead Location: 753859
Implantable Lead Location: 753860
Implantable Pulse Generator Implant Date: 20110513
Lead Channel Impedance Value: 260 Ohm
Lead Channel Impedance Value: 300 Ohm
Lead Channel Pacing Threshold Amplitude: 0.5 V
Lead Channel Pacing Threshold Amplitude: 0.75 V
Lead Channel Pacing Threshold Pulse Width: 0.5 ms
Lead Channel Pacing Threshold Pulse Width: 0.5 ms
Lead Channel Sensing Intrinsic Amplitude: 12 mV
Lead Channel Sensing Intrinsic Amplitude: 5 mV
Lead Channel Setting Pacing Amplitude: 2 V
Lead Channel Setting Pacing Amplitude: 2.5 V
Lead Channel Setting Pacing Pulse Width: 0.5 ms
Lead Channel Setting Sensing Sensitivity: 0.5 mV
Pulse Gen Serial Number: 608100

## 2020-05-03 NOTE — Addendum Note (Signed)
Addended by: Douglass Rivers D on: 05/03/2020 12:10 PM   Modules accepted: Level of Service

## 2020-05-03 NOTE — Progress Notes (Signed)
Remote ICD transmission.   

## 2020-05-04 ENCOUNTER — Ambulatory Visit (INDEPENDENT_AMBULATORY_CARE_PROVIDER_SITE_OTHER): Payer: Medicare Other | Admitting: *Deleted

## 2020-05-04 ENCOUNTER — Other Ambulatory Visit: Payer: Self-pay

## 2020-05-04 DIAGNOSIS — Z7901 Long term (current) use of anticoagulants: Secondary | ICD-10-CM | POA: Diagnosis not present

## 2020-05-04 DIAGNOSIS — I428 Other cardiomyopathies: Secondary | ICD-10-CM | POA: Diagnosis not present

## 2020-05-04 LAB — POCT INR: INR: 2.3 (ref 2.0–3.0)

## 2020-05-04 NOTE — Patient Instructions (Signed)
Description   Continue taking Warfarin 1/2 tablet every day except 1 tablet on Tuesday, Thursday, and Saturdays. Recheck in 4 weeks.  Main # [phone] Coumadin Clinic # [phone].

## 2020-05-21 ENCOUNTER — Telehealth (HOSPITAL_COMMUNITY): Payer: Self-pay | Admitting: *Deleted

## 2020-05-21 NOTE — Telephone Encounter (Signed)
Pt left vm stating he has been having dizzy spells and falling episodes he thinks his bp is too low. Pt said he had bp issues with entresto in the past. Called pt back to get more information no answer on [phone] left vm.

## 2020-05-23 ENCOUNTER — Encounter: Payer: Self-pay | Admitting: Internal Medicine

## 2020-05-23 ENCOUNTER — Inpatient Hospital Stay: Payer: Medicare Other

## 2020-05-23 ENCOUNTER — Other Ambulatory Visit: Payer: Self-pay

## 2020-05-23 ENCOUNTER — Inpatient Hospital Stay: Payer: Medicare Other | Attending: Internal Medicine | Admitting: Internal Medicine

## 2020-05-23 VITALS — BP 100/70 | HR 93 | Temp 96.6°F | Resp 17 | Ht 72.0 in | Wt 150.5 lb

## 2020-05-23 DIAGNOSIS — C3490 Malignant neoplasm of unspecified part of unspecified bronchus or lung: Secondary | ICD-10-CM | POA: Diagnosis not present

## 2020-05-23 DIAGNOSIS — Z79899 Other long term (current) drug therapy: Secondary | ICD-10-CM | POA: Diagnosis not present

## 2020-05-23 DIAGNOSIS — C3411 Malignant neoplasm of upper lobe, right bronchus or lung: Secondary | ICD-10-CM

## 2020-05-23 DIAGNOSIS — C7931 Secondary malignant neoplasm of brain: Secondary | ICD-10-CM | POA: Diagnosis present

## 2020-05-23 DIAGNOSIS — I10 Essential (primary) hypertension: Secondary | ICD-10-CM

## 2020-05-23 DIAGNOSIS — Z5112 Encounter for antineoplastic immunotherapy: Secondary | ICD-10-CM | POA: Diagnosis not present

## 2020-05-23 DIAGNOSIS — C342 Malignant neoplasm of middle lobe, bronchus or lung: Secondary | ICD-10-CM | POA: Insufficient documentation

## 2020-05-23 LAB — CBC WITH DIFFERENTIAL (CANCER CENTER ONLY)
Abs Immature Granulocytes: 0.01 10*3/uL (ref 0.00–0.07)
Basophils Absolute: 0 10*3/uL (ref 0.0–0.1)
Basophils Relative: 0 %
Eosinophils Absolute: 0.4 10*3/uL (ref 0.0–0.5)
Eosinophils Relative: 6 %
HCT: 30.5 % — ABNORMAL LOW (ref 39.0–52.0)
Hemoglobin: 9.6 g/dL — ABNORMAL LOW (ref 13.0–17.0)
Immature Granulocytes: 0 %
Lymphocytes Relative: 26 %
Lymphs Abs: 1.7 10*3/uL (ref 0.7–4.0)
MCH: 31 pg (ref 26.0–34.0)
MCHC: 31.5 g/dL (ref 30.0–36.0)
MCV: 98.4 fL (ref 80.0–100.0)
Monocytes Absolute: 0.6 10*3/uL (ref 0.1–1.0)
Monocytes Relative: 8 %
Neutro Abs: 4 10*3/uL (ref 1.7–7.7)
Neutrophils Relative %: 60 %
Platelet Count: 149 10*3/uL — ABNORMAL LOW (ref 150–400)
RBC: 3.1 MIL/uL — ABNORMAL LOW (ref 4.22–5.81)
RDW: 12.7 % (ref 11.5–15.5)
WBC Count: 6.7 10*3/uL (ref 4.0–10.5)
nRBC: 0 % (ref 0.0–0.2)

## 2020-05-23 LAB — CMP (CANCER CENTER ONLY)
ALT: 7 U/L (ref 0–44)
AST: 9 U/L — ABNORMAL LOW (ref 15–41)
Albumin: 3.3 g/dL — ABNORMAL LOW (ref 3.5–5.0)
Alkaline Phosphatase: 92 U/L (ref 38–126)
Anion gap: 7 (ref 5–15)
BUN: 17 mg/dL (ref 6–20)
CO2: 22 mmol/L (ref 22–32)
Calcium: 9.1 mg/dL (ref 8.9–10.3)
Chloride: 106 mmol/L (ref 98–111)
Creatinine: 1.35 mg/dL — ABNORMAL HIGH (ref 0.61–1.24)
GFR, Est AFR Am: >60 mL/min (ref >60)
GFR, Estimated: 57 mL/min — ABNORMAL LOW (ref >60)
Glucose, Bld: 311 mg/dL — ABNORMAL HIGH (ref 70–99)
Potassium: 4.3 mmol/L (ref 3.5–5.1)
Sodium: 135 mmol/L (ref 135–145)
Total Bilirubin: 0.4 mg/dL (ref 0.3–1.2)
Total Protein: 6.3 g/dL — ABNORMAL LOW (ref 6.5–8.1)

## 2020-05-23 LAB — TSH: TSH: 0.427 u[IU]/mL (ref 0.320–4.118)

## 2020-05-23 MED ORDER — SODIUM CHLORIDE 0.9 % IV SOLN
Freq: Once | INTRAVENOUS | Status: AC
  Filled 2020-05-23: qty 250

## 2020-05-23 MED ORDER — SODIUM CHLORIDE 0.9 % IV SOLN
1500.0000 mg | Freq: Once | INTRAVENOUS | Status: AC
  Administered 2020-05-23: 1500 mg via INTRAVENOUS
  Filled 2020-05-23: qty 30

## 2020-05-23 NOTE — Patient Instructions (Signed)
Steps to Quit Smoking Smoking tobacco is the leading cause of preventable death. It can affect almost every organ in the body. Smoking puts you and people around you at risk for many serious, long-lasting (chronic) diseases. Quitting smoking can be hard, but it is one of the best things that you can do for your health. It is never too late to quit. How do I get ready to quit? When you decide to quit smoking, make a plan to help you succeed. Before you quit:  Pick a date to quit. Set a date within the next 2 weeks to give you time to prepare.  Write down the reasons why you are quitting. Keep this list in places where you will see it often.  Tell your family, friends, and co-workers that you are quitting. Their support is important.  Talk with your doctor about the choices that may help you quit.  Find out if your health insurance will pay for these treatments.  Know the people, places, things, and activities that make you want to smoke (triggers). Avoid them. What first steps can I take to quit smoking?  Throw away all cigarettes at home, at work, and in your car.  Throw away the things that you use when you smoke, such as ashtrays and lighters.  Clean your car. Make sure to empty the ashtray.  Clean your home, including curtains and carpets. What can I do to help me quit smoking? Talk with your doctor about taking medicines and seeing a counselor at the same time. You are more likely to succeed when you do both.  If you are pregnant or breastfeeding, talk with your doctor about counseling or other ways to quit smoking. Do not take medicine to help you quit smoking unless your doctor tells you to do so. To quit smoking: Quit right away  Quit smoking totally, instead of slowly cutting back on how much you smoke over a period of time.  Go to counseling. You are more likely to quit if you go to counseling sessions regularly. Take medicine You may take medicines to help you quit. Some  medicines need a prescription, and some you can buy over-the-counter. Some medicines may contain a drug called nicotine to replace the nicotine in cigarettes. Medicines may:  Help you to stop having the desire to smoke (cravings).  Help to stop the problems that come when you stop smoking (withdrawal symptoms). Your doctor may ask you to use:  Nicotine patches, gum, or lozenges.  Nicotine inhalers or sprays.  Non-nicotine medicine that is taken by mouth. Find resources Find resources and other ways to help you quit smoking and remain smoke-free after you quit. These resources are most helpful when you use them often. They include:  Online chats with a counselor.  Phone quitlines.  Printed self-help materials.  Support groups or group counseling.  Text messaging programs.  Mobile phone apps. Use apps on your mobile phone or tablet that can help you stick to your quit plan. There are many free apps for mobile phones and tablets as well as websites. Examples include Quit Guide from the CDC and smokefree.gov  What things can I do to make it easier to quit?   Talk to your family and friends. Ask them to support and encourage you.  Call a phone quitline (1-800-QUIT-NOW), reach out to support groups, or work with a counselor.  Ask people who smoke to not smoke around you.  Avoid places that make you want to smoke,   such as: ? Bars. ? Parties. ? Smoke-break areas at work.  Spend time with people who do not smoke.  Lower the stress in your life. Stress can make you want to smoke. Try these things to help your stress: ? Getting regular exercise. ? Doing deep-breathing exercises. ? Doing yoga. ? Meditating. ? Doing a body scan. To do this, close your eyes, focus on one area of your body at a time from head to toe. Notice which parts of your body are tense. Try to relax the muscles in those areas. How will I feel when I quit smoking? Day 1 to 3 weeks Within the first 24 hours,  you may start to have some problems that come from quitting tobacco. These problems are very bad 2-3 days after you quit, but they do not often last for more than 2-3 weeks. You may get these symptoms:  Mood swings.  Feeling restless, nervous, angry, or annoyed.  Trouble concentrating.  Dizziness.  Strong desire for high-sugar foods and nicotine.  Weight gain.  Trouble pooping (constipation).  Feeling like you may vomit (nausea).  Coughing or a sore throat.  Changes in how the medicines that you take for other issues work in your body.  Depression.  Trouble sleeping (insomnia). Week 3 and afterward After the first 2-3 weeks of quitting, you may start to notice more positive results, such as:  Better sense of smell and taste.  Less coughing and sore throat.  Slower heart rate.  Lower blood pressure.  Clearer skin.  Better breathing.  Fewer sick days. Quitting smoking can be hard. Do not give up if you fail the first time. Some people need to try a few times before they succeed. Do your best to stick to your quit plan, and talk with your doctor if you have any questions or concerns. Summary  Smoking tobacco is the leading cause of preventable death. Quitting smoking can be hard, but it is one of the best things that you can do for your health.  When you decide to quit smoking, make a plan to help you succeed.  Quit smoking right away, not slowly over a period of time.  When you start quitting, seek help from your doctor, family, or friends. This information is not intended to replace advice given to you by your health care provider. Make sure you discuss any questions you have with your health care provider. Document Revised: 06/24/2019 Document Reviewed: 12/18/2018 Elsevier Patient Education  2020 Elsevier Inc.  

## 2020-05-23 NOTE — Patient Instructions (Signed)
[facility] Discharge Instructions for Patients Receiving Chemotherapy  Today you received the following immunotherapy agent: Imfinzi  To help prevent nausea and vomiting after your treatment, we encourage you to take your nausea medication as directed by your MD.   If you develop nausea and vomiting that is not controlled by your nausea medication, call the clinic.   BELOW ARE SYMPTOMS THAT SHOULD BE REPORTED IMMEDIATELY:  *FEVER GREATER THAN 100.5 F  *CHILLS WITH OR WITHOUT FEVER  NAUSEA AND VOMITING THAT IS NOT CONTROLLED WITH YOUR NAUSEA MEDICATION  *UNUSUAL SHORTNESS OF BREATH  *UNUSUAL BRUISING OR BLEEDING  TENDERNESS IN MOUTH AND THROAT WITH OR WITHOUT PRESENCE OF ULCERS  *URINARY PROBLEMS  *BOWEL PROBLEMS  UNUSUAL RASH Items with * indicate a potential emergency and should be followed up as soon as possible.  Feel free to call the clinic should you have any questions or concerns. The clinic phone number is [phone].  Please show the [facility] at check-in to the Emergency Department and triage nurse.  Durvalumab injection What is this medicine? DURVALUMAB (dur VAL ue mab) is a monoclonal antibody. It is used to treat urothelial cancer and lung cancer. This medicine may be used for other purposes; ask your health care provider or pharmacist if you have questions. COMMON BRAND NAME(S): IMFINZI What should I tell my health care provider before I take this medicine? They need to know if you have any of these conditions:  diabetes  immune system problems  infection  inflammatory bowel disease  kidney disease  liver disease  lung or breathing disease  lupus  organ transplant  stomach or intestine problems  thyroid disease  an unusual or allergic reaction to durvalumab, other medicines, foods, dyes, or preservatives  pregnant or trying to get pregnant  breast-feeding How should I use this medicine? This medicine is for  infusion into a vein. It is given by a health care professional in a hospital or clinic setting. A special MedGuide will be given to you before each treatment. Be sure to read this information carefully each time. Talk to your pediatrician regarding the use of this medicine in children. Special care may be needed. Overdosage: If you think you have taken too much of this medicine contact a poison control center or emergency room at once. NOTE: This medicine is only for you. Do not share this medicine with others. What if I miss a dose? It is important not to miss your dose. Call your doctor or health care professional if you are unable to keep an appointment. What may interact with this medicine? Interactions have not been studied. This list may not describe all possible interactions. Give your health care provider a list of all the medicines, herbs, non-prescription drugs, or dietary supplements you use. Also tell them if you smoke, drink alcohol, or use illegal drugs. Some items may interact with your medicine. What should I watch for while using this medicine? This drug may make you feel generally unwell. Continue your course of treatment even though you feel ill unless your doctor tells you to stop. You may need blood work done while you are taking this medicine. Do not become pregnant while taking this medicine or for 3 months after stopping it. Women should inform their doctor if they wish to become pregnant or think they might be pregnant. There is a potential for serious side effects to an unborn child. Talk to your health care professional or pharmacist for more information.  Do not breast-feed an infant while taking this medicine or for 3 months after stopping it. What side effects may I notice from receiving this medicine? Side effects that you should report to your doctor or health care professional as soon as possible:  allergic reactions like skin rash, itching or hives, swelling of the  face, lips, or tongue  black, tarry stools  bloody or watery diarrhea  breathing problems  change in emotions or moods  change in sex drive  changes in vision  chest pain or chest tightness  chills  confusion  cough  facial flushing  fever  headache  signs and symptoms of high blood sugar such as dizziness; dry mouth; dry skin; fruity breath; nausea; stomach pain; increased hunger or thirst; increased urination  signs and symptoms of liver injury like dark yellow or brown urine; general ill feeling or flu-like symptoms; light-colored stools; loss of appetite; nausea; right upper belly pain; unusually weak or tired; yellowing of the eyes or skin  stomach pain  trouble passing urine or change in the amount of urine  weight gain or weight loss Side effects that usually do not require medical attention (report these to your doctor or health care professional if they continue or are bothersome):  bone pain  constipation  loss of appetite  muscle pain  nausea  swelling of the ankles, feet, hands  tiredness This list may not describe all possible side effects. Call your doctor for medical advice about side effects. You may report side effects to FDA at 1-800-FDA-1088. Where should I keep my medicine? This drug is given in a hospital or clinic and will not be stored at home. NOTE: This sheet is a summary. It may not cover all possible information. If you have questions about this medicine, talk to your doctor, pharmacist, or health care provider.  2020 Elsevier/Gold Standard (2016-12-09 19:25:04)

## 2020-05-23 NOTE — Progress Notes (Signed)
Dennis [name]   [phone]  OFFICE PROGRESS NOTE  Dennis Pee, MD [place] [address]  DIAGNOSIS: Extensive stage small cell lung cancer. He presented with a spiculated right middle lobe pulmonary nodule and mildly enlargedsolitary aortopulmonary lymph node. 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 He was diagnosed in March 2021.  PRIOR THERAPY: 1) Whole brain radiation under the care of Dr. Lisbeth [name]. Completed 01/17/2020  CURRENT THERAPY: Systemic chemotherapy withcarboplatin for an AUC of 5 on day 1,etoposide 100 mg/m on days 1, 2, and 3, and Imfinzi 1500 mg on day 1 IV every 3 weeks. First dose expected on4/20/21. Status post 5 cycles.  Starting from cycle #5 the patient will be treated with maintenance Imfinzi 1500 mg IV every 4 weeks.  INTERVAL HISTORY: Dennis [name]. 59 y.o. male returns to the clinic today for follow-up visit.  The patient is feeling fine today with no concerning complaints except for dizzy spells recently and frequent falls.  He think it is because of the changes in his blood pressure.  He denied having any chest pain, shortness of breath, cough or hemoptysis.  He denied having any fever or chills.  He has no nausea, vomiting, diarrhea or constipation.  He has no recent weight loss or night sweats.  The patient is currently on maintenance treatment with Imfinzi every 4 weeks and tolerated the first cycle of this treatment well.  He is here today for evaluation before starting cycle #6.    MEDICAL HISTORY: Past Medical History:  Diagnosis Date  . Alcohol abuse with alcohol-induced mood disorder ([place]) 08/18/2015  . Automatic implantable cardioverter-defibrillator in situ 2012   S/P [employer] Jude Dual Chamber. ICD.  Marland Kitchen CAD (coronary artery disease) 11/16/2014   CAD Coronary angiography (12/2008) with mid LAD totally occluded and  collateralized.  . Cardiac LV ejection fraction 10-20%   . Chest pain 09/04/2018  . CHF (congestive heart failure) ([place])   . Chronic systolic heart failure (HCC)    a) Mixed ICM/NICM b) RHC (05/2014): RA 2, RV 19/2/3, PA 22/14 (18), PCWP 6, Fick CO/CI: 5.2 / 2.7, PVR 2.3 WU, PA 60% and 64% c) ECHO (05/2014): EF 20-25%, diff HK, akinesis entireanteroseptal myocardium, triv AI, mod MR, LA mod/sev dilated  . Cocaine abuse ([place]) 01/24/2015  . Cocaine abuse with cocaine-induced mood disorder ([place]) 08/20/2015  . Drug abuse and dependence ([place])   . Dysphagia following cerebral infarction 01/24/2015  . Embolic stroke involving right middle cerebral artery ([place])   . Extensive stage primary small cell carcinoma of lung ([place]) 01/04/2020  . H/O noncompliance with medical treatment, presenting hazards to health 01/24/2015  . Heart murmur   . High cholesterol   . Ischemic cardiomyopathy    a) Coronary angiography (12/2008) at [facility]: [place]: nl, LAD mid 100% stenosis with left to left and right-to-left collaterals to the distal LAD; L[place]: nl, RCA nl.    . 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[place])   . MDD (major depressive disorder), recurrent severe, without psychosis ([place]) 08/18/2015  . Open wound of foot 02/27/2015  . Pneumonia 1988  . Psychoactive substance-induced mood disorder ([place]) 07/17/2015  . Sleep apnea    "cleared after T&A"  . Status post PICC central line placement   . Stroke ([place])   . Substance induced mood disorder ([place]) 08/17/2015  . Type II diabetes mellitus ([place])  ALLERGIES:  has No Known Allergies.  MEDICATIONS:  Current Outpatient Medications  Medication Sig Dispense Refill  . Accu-Chek Softclix Lancets lancets     . atorvastatin (LIPITOR) 20 MG tablet TAKE 1 TABLET BY MOUTH DAILY AT 6 PM. (Patient taking differently: Take 20 mg by mouth daily at 6 PM. ) 30 tablet 6  . Blood Glucose Monitoring Suppl (FIFTY50 GLUCOSE METER 2.0) w/Device KIT Use as instructed    . carvedilol (COREG) 6.25 MG  tablet Take 1.5 tablets (9.375 mg total) by mouth 2 (two) times daily with a meal. 90 tablet 3  . empagliflozin (JARDIANCE) 10 MG TABS tablet Take 10 mg by mouth daily before breakfast. 30 tablet 6  . Insulin Glargine (LANTUS SOLOSTAR) 100 UNIT/ML Solostar Pen Inject 36 Units into the skin daily at 10 pm.     . INSUPEN ULTRAFIN 31G X 8 MM MISC     . memantine (NAMENDA) 5 MG tablet Start on 1st day of radiation. Week 1: take one po daily. Week 2: take one po twice daily. Week 3: take two po qam, and one po q pm. Week 4: take 2 po twice daily for remaining 20 weeks. 630 tablet 0  . metFORMIN (GLUCOPHAGE-XR) 500 MG 24 hr tablet Take 1,000 mg by mouth daily with breakfast. Per [facility] pharmacist    . ondansetron (ZOFRAN) 8 MG tablet Take 1 tablet (8 mg total) by mouth every 8 (eight) hours as needed for nausea or vomiting. 20 tablet 0  . pantoprazole (PROTONIX) 40 MG tablet Take 1 tablet by mouth daily.    . prochlorperazine (COMPAZINE) 10 MG tablet Take 1 tablet (10 mg total) by mouth every 6 (six) hours as needed. 30 tablet 2  . sacubitril-valsartan (ENTRESTO) 24-26 MG Take 1 tablet by mouth 2 (two) times daily. 60 tablet 3  . spironolactone (ALDACTONE) 25 MG tablet Take 1 tablet (25 mg total) by mouth daily. 90 tablet 3  . warfarin (COUMADIN) 7.5 MG tablet TAKE 1 TABLET BY MOUTH DAILY AT 6PM. 30 tablet 0   No current facility-administered medications for this visit.    SURGICAL HISTORY:  Past Surgical History:  Procedure Laterality Date  . BRONCHIAL BRUSHINGS  12/28/2019   Procedure: BRONCHIAL BRUSHINGS;  Surgeon: Garner Nash, DO;  Location: [facility];  Service: Thoracic;;  . BRONCHIAL NEEDLE ASPIRATION BIOPSY  12/28/2019   Procedure: BRONCHIAL NEEDLE ASPIRATION BIOPSIES;  Surgeon: Garner Nash, DO;  Location: [facility];  Service: Thoracic;;  . BRONCHIAL WASHINGS  12/28/2019   Procedure: BRONCHIAL WASHINGS;  Surgeon: Garner Nash, DO;  Location: [facility];  Service: Thoracic;;    . CARDIAC CATHETERIZATION  05/2014  . CARDIAC DEFIBRILLATOR PLACEMENT  03/2011  . CHOLECYSTECTOMY  1994  . FOREARM FRACTURE SURGERY Left 1980  . FRACTURE SURGERY    . RIGHT HEART CATHETERIZATION N/A 05/30/2014   Procedure: RIGHT HEART CATH;  Surgeon: Jolaine Artist, MD;  Location[facility] C[facility] LAB;  Service: Cardiovascular;  Laterality: N/A;  . RIGHT HEART CATHETERIZATION N/A 02/07/2015   Procedure: RIGHT HEART CATH;  Surgeon: Jolaine Artist, MD;  Location: [facility] CATH LAB;  Service: Cardiovascular;  Laterality: N/A;  . TONSILLECTOMY AND ADENOIDECTOMY  ~ 1998  . VIDEO BRONCHOSCOPY WITH ENDOBRONCHIAL ULTRASOUND N/A 12/28/2019   Procedure: VIDEO BRONCHOSCOPY WITH ENDOBRONCHIAL ULTRASOUND;  Surgeon: Garner Nash, DO;  Location: [facility];  Service: Thoracic;  Laterality: N/A;    REVIEW OF SYSTEMS:  A comprehensive review of systems was negative except for: Constitutional: positive  for fatigue Neurological: positive for dizziness   PHYSICAL EXAMINATION: General appearance: alert, cooperative and no distress Head: Normocephalic, without obvious abnormality, atraumatic Neck: no adenopathy, no JVD, supple, symmetrical, trachea midline and thyroid not enlarged, symmetric, no tenderness/mass/nodules Lymph nodes: Cervical, supraclavicular, and axillary nodes normal. Resp: clear to auscultation bilaterally Back: symmetric, no curvature. ROM normal. No CVA tenderness. Cardio: regular rate and rhythm, S1, S2 normal, no murmur, click, rub or gallop GI: soft, non-tender; bowel sounds normal; no masses,  no organomegaly Extremities: extremities normal, atraumatic, no cyanosis or edema  ECOG PERFORMANCE STATUS: 1 - Symptomatic but completely ambulatory  Blood pressure 100/70, pulse 93, temperature (!) 96.6 F (35.9 C), temperature source Tympanic, resp. rate 17, height 6' (1.829 m), weight 150 lb 8 oz (68.3 kg), SpO2 100 %.  LABORATORY DATA: Lab Results  Component Value Date   WBC 6.7 05/23/2020    HGB 9.6 (L) 05/23/2020   HCT 30.5 (L) 05/23/2020   MCV 98.4 05/23/2020   PLT 149 (L) 05/23/2020      Chemistry      Component Value Date/Time   NA 139 04/24/2020 1139   K 4.4 04/24/2020 1139   CL 108 04/24/2020 1139   CO2 22 04/24/2020 1139   BUN 13 04/24/2020 1139   CREATININE 1.25 (H) 04/24/2020 1139      Component Value Date/Time   CALCIUM 9.2 04/24/2020 1139   ALKPHOS 142 (H) 04/24/2020 1139   AST 14 (L) 04/24/2020 1139   ALT 13 04/24/2020 1139   BILITOT 0.3 04/24/2020 1139       RADIOGRAPHIC STUDIES: CUP PACEART REMOTE DEVICE CHECK  Result Date: 05/02/2020 Scheduled remote reviewed. Normal device function.  The device estimates 6.5 months until ERI.  Sent to triage. Atrial high rates are not true arrhythmias but known atrial lead noise.  Atrial lead impedance is 260 ohms Next remote 05/14/2020. Kathy Breach, RN, CCDS, [employer]   ASSESSMENT AND PLAN: This is a very pleasant 59 years old African-American male recently diagnosed with extensive stage small cell lung cancer presented with spiculated right middle lobe pulmonary nodule in addition to mediastinal lymphadenopathy and multiple metastatic brain lesions.  The patient is status post whole brain irradiation. He is currently undergoing systemic chemotherapy with carboplatin, etoposide and Imfinzi status post 5 cycles.  Starting from cycle #5 the patient is on maintenance treatment with immunotherapy with Imfinzi every 4 weeks. He tolerated the first cycle of his maintenance treatment fairly well. I recommended for him to proceed with cycle #6 of his treatment today as planned. For the dizzy spells and falls, I will arrange for the patient to have CT scan of the head with and without contrast to rule out metastatic disease to the brain.  The patient cannot have MRI of the brain because of the defibrillator. He will come back for follow-up visit in 4 weeks for evaluation before the next cycle of his  treatment. He was advised to call immediately if he has any concerning symptoms in the interval. The patient voices understanding of current disease status and treatment options and is in agreement with the current care plan.  All questions were answered. The patient knows to call the clinic with any problems, questions or concerns. We can certainly see the patient much sooner if necessary.  Disclaimer: This note was dictated with voice recognition software. Similar sounding words can inadvertently be transcribed and may not be corrected upon review.

## 2020-05-28 ENCOUNTER — Telehealth: Payer: Self-pay | Admitting: Medical Oncology

## 2020-05-28 NOTE — Telephone Encounter (Signed)
"  Bensimhon, Shaune Pascal, MD  Ardeen Garland, RN Lets stop jardiance ."  05/28/2020- I left the above message on pts phone and requested a call back.

## 2020-05-28 NOTE — Addendum Note (Signed)
Addended by: Ardeen Garland on: 05/28/2020 11:35 AM   Modules accepted: Orders

## 2020-06-06 ENCOUNTER — Other Ambulatory Visit: Payer: Self-pay

## 2020-06-06 ENCOUNTER — Encounter (HOSPITAL_COMMUNITY): Payer: Self-pay

## 2020-06-06 ENCOUNTER — Ambulatory Visit (HOSPITAL_COMMUNITY)
Admission: RE | Admit: 2020-06-06 | Discharge: 2020-06-06 | Disposition: A | Discharge status: Home / Self Care | Payer: Medicare Other | Source: Ambulatory Visit | Attending: Internal Medicine | Admitting: Internal Medicine

## 2020-06-06 DIAGNOSIS — I63511 Cerebral infarction due to unspecified occlusion or stenosis of right middle cerebral artery: Secondary | ICD-10-CM | POA: Insufficient documentation

## 2020-06-06 DIAGNOSIS — C3490 Malignant neoplasm of unspecified part of unspecified bronchus or lung: Secondary | ICD-10-CM | POA: Insufficient documentation

## 2020-06-06 IMAGING — CT CT HEAD WO/W CM
3 of 4 series · 14 of 47 positions shown, 16 images · IV contrast (OMNIPAQUE)
Comparison: [DATE]

CLINICAL DATA: Restaging of metastatic small cell lung cancer.
History of brain radiation. Dizziness with falls recently.

EXAM:
CT HEAD WITHOUT AND WITH CONTRAST
TECHNIQUE: Contiguous axial images were obtained from the base of the skull
through the vertex without and with intravenous contrast
CONTRAST:  75mL OMNIPAQUE IOHEXOL 300 MG/ML  SOLN

[Series 2: head wo · axial · 0.47mm/px · z∈[+1383,+1503]mm · 8 of 30 slices shown, 10 images]
[im 3/30  brain]
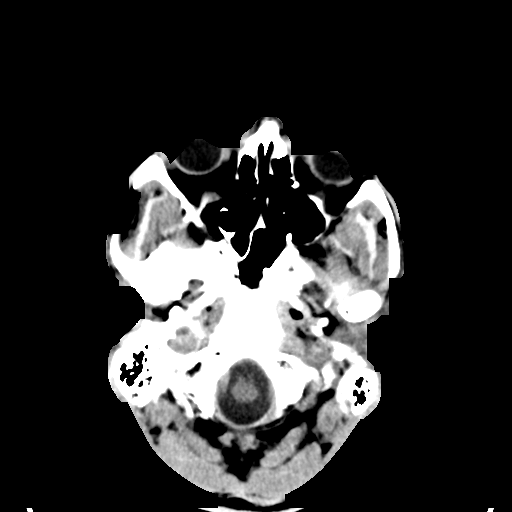
[im 3/30  bone]
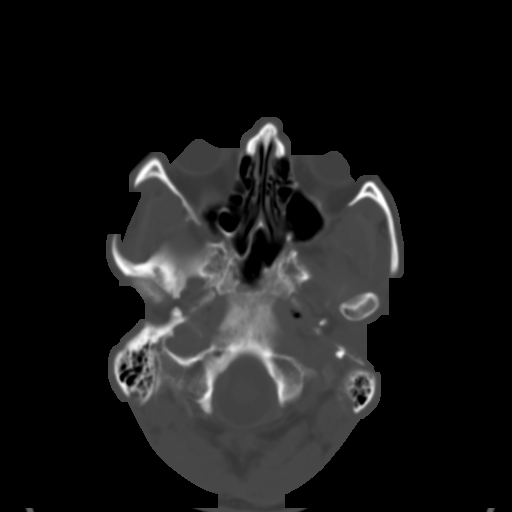
[im 7/30  brain]
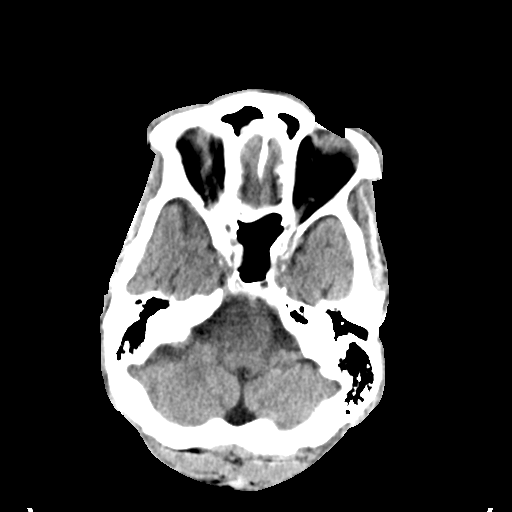
[im 11/30  brain]
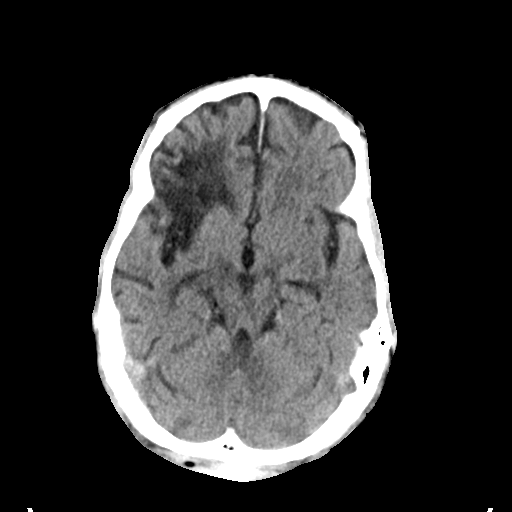
[im 13/30  brain]
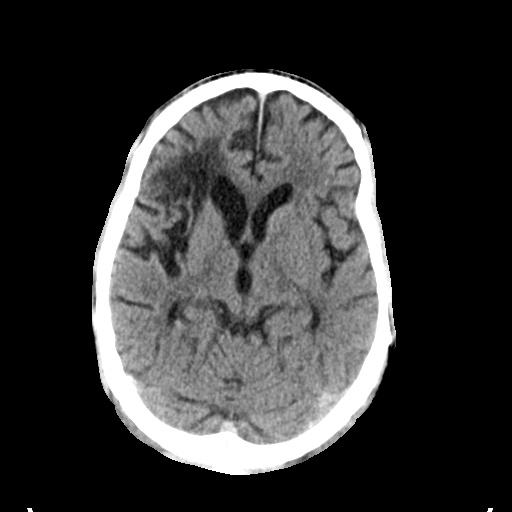
[im 17/30  brain]
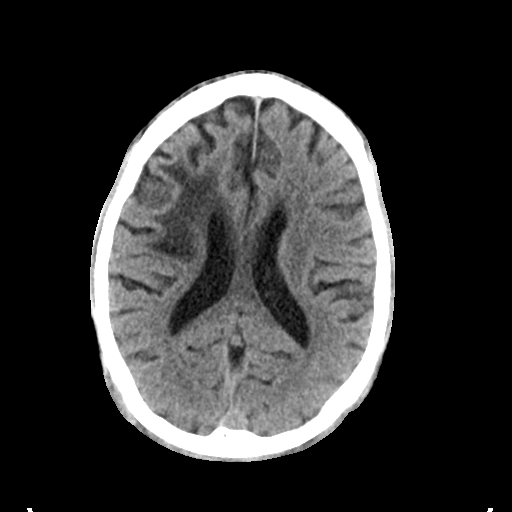
[im 17/30  bone]
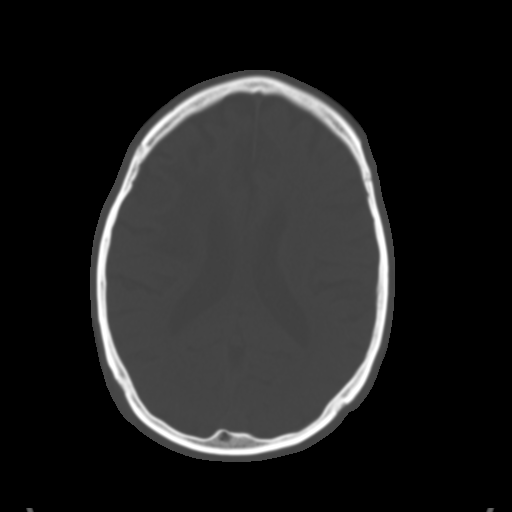
[im 19/30  brain]
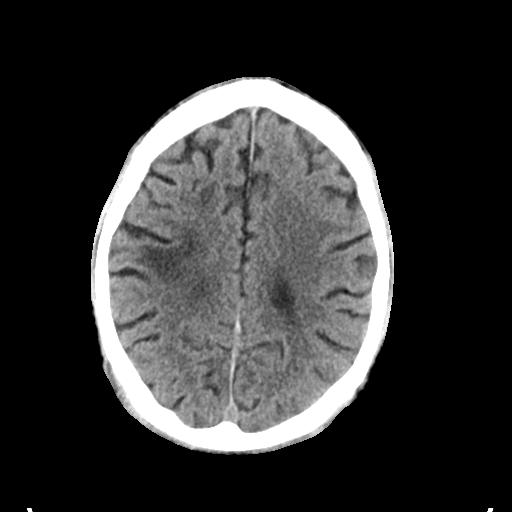
[im 23/30  brain]
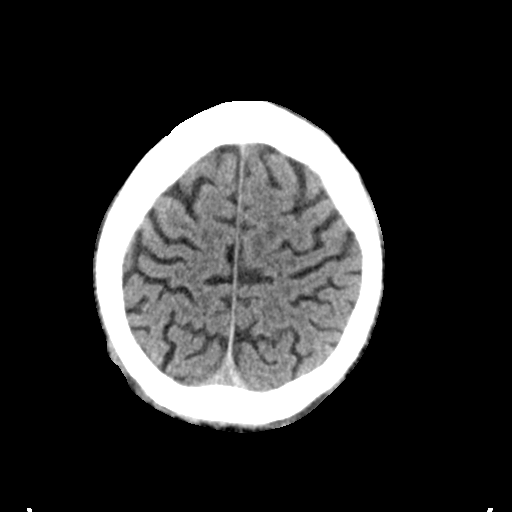
[im 27/30  brain]
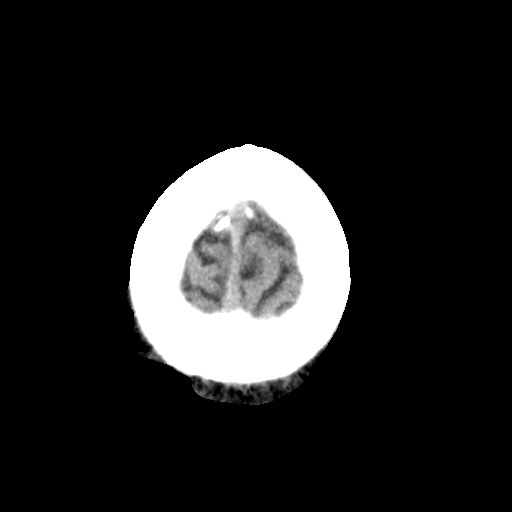

[Series 5: coronal soft tissue · coronal · 0.36mm/px · 3 of 68 slices shown]
[im 23/68  brain]
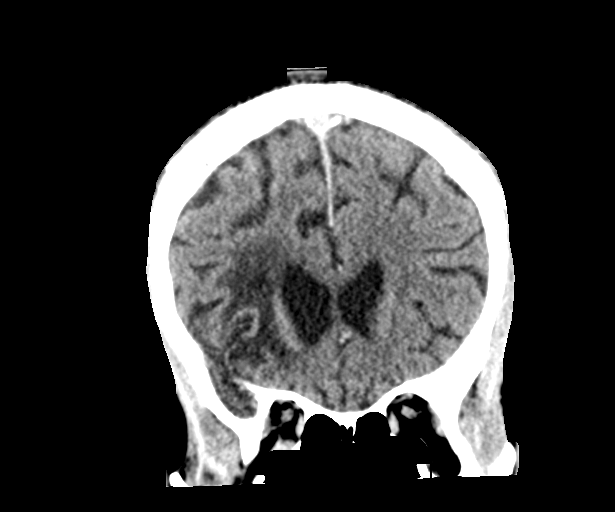
[im 30/68  brain]
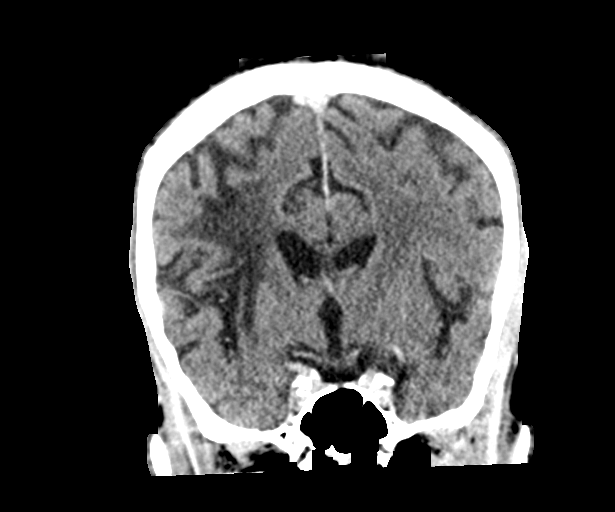
[im 38/68  brain]
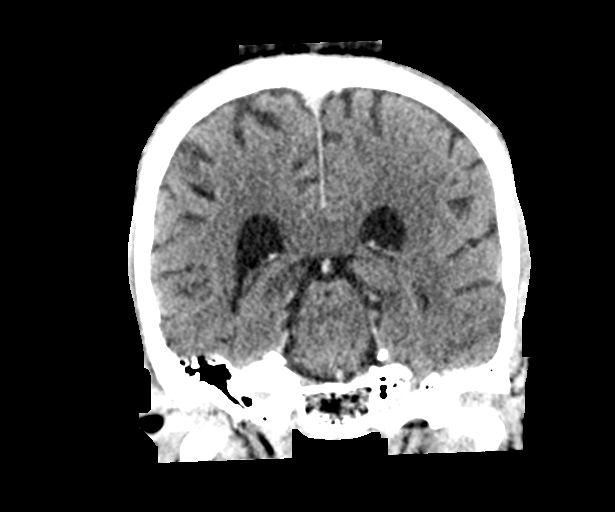

[Series 6: sagittal soft tissue · sagittal · 0.36mm/px · 3 of 52 slices shown]
[im 18/52  brain]
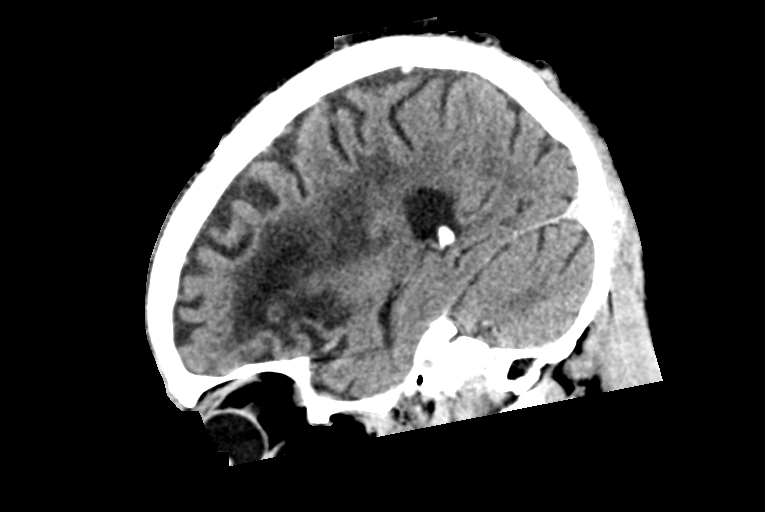
[im 26/52  brain]
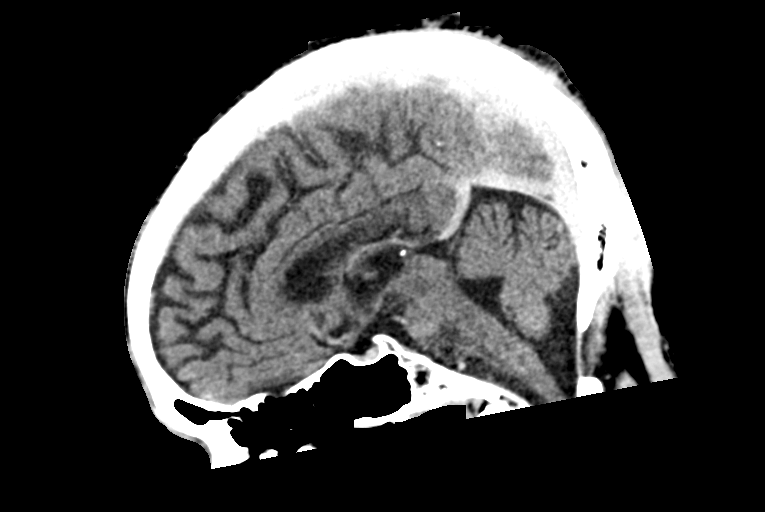
[im 35/52  brain]
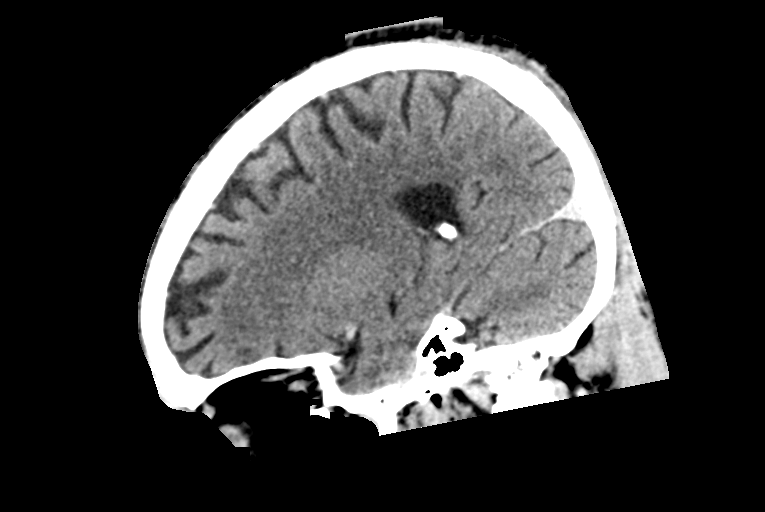

[14 of 47 positions shown; findings below may reference images not displayed]

FINDINGS: Brain: The enhancing right frontal mass on the prior CT is no longer
evident, and associated edema has resolved. No new enhancing lesions
are identified. No acute infarct, intracranial hemorrhage, midline
shift, or extra-axial fluid collection is evident. A moderate-sized
chronic right MCA infarct is again noted involving the frontal lobe
and insula. There is mild ex vacuo dilatation of the right lateral
ventricle.

Vascular: Calcified atherosclerosis at the skull base. The major
dural venous sinuses and large arteries at the base of the brain are
grossly patent.

Skull: No fracture or suspicious osseous lesion.

Sinuses/Orbits: Visualized paranasal sinuses and mastoid air cells
are clear. Visualized portions of the orbits are unremarkable.

Other: None.
IMPRESSION: 1. Interval resolution of right frontal metastasis and edema.
2. No evidence of new intracranial metastases or acute abnormality.
3. Chronic right MCA infarct.

## 2020-06-06 MED ORDER — IOHEXOL 300 MG/ML  SOLN
75.0000 mL | Freq: Once | INTRAMUSCULAR | Status: AC | PRN
  Administered 2020-06-06: 75 mL via INTRAVENOUS

## 2020-06-06 MED ORDER — SODIUM CHLORIDE (PF) 0.9 % IJ SOLN
INTRAMUSCULAR | Status: AC
  Filled 2020-06-06: qty 50

## 2020-06-07 ENCOUNTER — Telehealth: Payer: Self-pay | Admitting: Medical Oncology

## 2020-06-07 NOTE — Telephone Encounter (Signed)
Dental issue - He knocked his tooth after a recent fall. The tooth is still "partially in" the gum .  Dennis [name]  went to periodontist and cost was prohibitive @$2000.  He asked to see Dr Enrique Sack.  Action-Message left at Dr Enrique Sack office to contact me to see if pt can be seen.Marland Kitchen

## 2020-06-12 ENCOUNTER — Telehealth: Payer: Self-pay | Admitting: Medical Oncology

## 2020-06-12 NOTE — Telephone Encounter (Signed)
Dental issue f/u -I told him to f/u with  another dentist -Dr Enrique Sack is not available.

## 2020-06-15 ENCOUNTER — Other Ambulatory Visit: Payer: Self-pay

## 2020-06-15 ENCOUNTER — Ambulatory Visit (INDEPENDENT_AMBULATORY_CARE_PROVIDER_SITE_OTHER): Payer: Medicare Other | Admitting: *Deleted

## 2020-06-15 DIAGNOSIS — I428 Other cardiomyopathies: Secondary | ICD-10-CM

## 2020-06-15 DIAGNOSIS — Z7901 Long term (current) use of anticoagulants: Secondary | ICD-10-CM | POA: Diagnosis not present

## 2020-06-15 DIAGNOSIS — Z5181 Encounter for therapeutic drug level monitoring: Secondary | ICD-10-CM | POA: Diagnosis not present

## 2020-06-15 LAB — POCT INR: INR: 2.7 (ref 2.0–3.0)

## 2020-06-15 NOTE — Patient Instructions (Signed)
Description   Continue taking Warfarin 1/2 tablet every day except 1 tablet on Tuesday, Thursday, and Saturdays. Recheck in 5 weeks.  Main # [phone] [facility] # [phone].

## 2020-06-20 ENCOUNTER — Emergency Department (HOSPITAL_COMMUNITY)
Admission: EM | Admit: 2020-06-20 | Discharge: 2020-06-20 | Disposition: A | Discharge status: Home / Self Care | Payer: Medicare Other | Attending: Emergency Medicine | Admitting: Emergency Medicine

## 2020-06-20 ENCOUNTER — Inpatient Hospital Stay: Payer: Medicare Other

## 2020-06-20 ENCOUNTER — Encounter (HOSPITAL_COMMUNITY): Payer: Self-pay | Admitting: Obstetrics and Gynecology

## 2020-06-20 ENCOUNTER — Encounter: Payer: Self-pay | Admitting: Internal Medicine

## 2020-06-20 ENCOUNTER — Inpatient Hospital Stay: Payer: Medicare Other | Attending: Radiation Oncology | Admitting: Internal Medicine

## 2020-06-20 ENCOUNTER — Other Ambulatory Visit: Payer: Self-pay

## 2020-06-20 VITALS — BP 137/94 | HR 96 | Temp 97.8°F | Resp 18 | Ht 72.0 in | Wt 144.9 lb

## 2020-06-20 DIAGNOSIS — E1165 Type 2 diabetes mellitus with hyperglycemia: Secondary | ICD-10-CM | POA: Diagnosis not present

## 2020-06-20 DIAGNOSIS — I5022 Chronic systolic (congestive) heart failure: Secondary | ICD-10-CM | POA: Diagnosis not present

## 2020-06-20 DIAGNOSIS — R739 Hyperglycemia, unspecified: Secondary | ICD-10-CM

## 2020-06-20 DIAGNOSIS — E1169 Type 2 diabetes mellitus with other specified complication: Secondary | ICD-10-CM | POA: Insufficient documentation

## 2020-06-20 DIAGNOSIS — Z7901 Long term (current) use of anticoagulants: Secondary | ICD-10-CM | POA: Diagnosis not present

## 2020-06-20 DIAGNOSIS — C3490 Malignant neoplasm of unspecified part of unspecified bronchus or lung: Secondary | ICD-10-CM | POA: Diagnosis not present

## 2020-06-20 DIAGNOSIS — F17210 Nicotine dependence, cigarettes, uncomplicated: Secondary | ICD-10-CM | POA: Diagnosis not present

## 2020-06-20 DIAGNOSIS — Z79899 Other long term (current) drug therapy: Secondary | ICD-10-CM | POA: Insufficient documentation

## 2020-06-20 DIAGNOSIS — E1139 Type 2 diabetes mellitus with other diabetic ophthalmic complication: Secondary | ICD-10-CM | POA: Diagnosis not present

## 2020-06-20 DIAGNOSIS — I10 Essential (primary) hypertension: Secondary | ICD-10-CM | POA: Diagnosis not present

## 2020-06-20 DIAGNOSIS — I2510 Atherosclerotic heart disease of native coronary artery without angina pectoris: Secondary | ICD-10-CM | POA: Insufficient documentation

## 2020-06-20 DIAGNOSIS — Z9221 Personal history of antineoplastic chemotherapy: Secondary | ICD-10-CM | POA: Insufficient documentation

## 2020-06-20 DIAGNOSIS — Z85118 Personal history of other malignant neoplasm of bronchus and lung: Secondary | ICD-10-CM | POA: Insufficient documentation

## 2020-06-20 DIAGNOSIS — Z95810 Presence of automatic (implantable) cardiac defibrillator: Secondary | ICD-10-CM | POA: Diagnosis not present

## 2020-06-20 DIAGNOSIS — C3411 Malignant neoplasm of upper lobe, right bronchus or lung: Secondary | ICD-10-CM | POA: Diagnosis not present

## 2020-06-20 DIAGNOSIS — I110 Hypertensive heart disease with heart failure: Secondary | ICD-10-CM | POA: Diagnosis not present

## 2020-06-20 DIAGNOSIS — Z5112 Encounter for antineoplastic immunotherapy: Secondary | ICD-10-CM | POA: Diagnosis not present

## 2020-06-20 DIAGNOSIS — E785 Hyperlipidemia, unspecified: Secondary | ICD-10-CM | POA: Insufficient documentation

## 2020-06-20 DIAGNOSIS — C342 Malignant neoplasm of middle lobe, bronchus or lung: Secondary | ICD-10-CM | POA: Insufficient documentation

## 2020-06-20 DIAGNOSIS — Z794 Long term (current) use of insulin: Secondary | ICD-10-CM | POA: Insufficient documentation

## 2020-06-20 LAB — CBC WITH DIFFERENTIAL (CANCER CENTER ONLY)
Abs Immature Granulocytes: 0.01 10*3/uL (ref 0.00–0.07)
Basophils Absolute: 0 10*3/uL (ref 0.0–0.1)
Basophils Relative: 1 %
Eosinophils Absolute: 0.1 10*3/uL (ref 0.0–0.5)
Eosinophils Relative: 2 %
HCT: 32.1 % — ABNORMAL LOW (ref 39.0–52.0)
Hemoglobin: 10.1 g/dL — ABNORMAL LOW (ref 13.0–17.0)
Immature Granulocytes: 0 %
Lymphocytes Relative: 22 %
Lymphs Abs: 1.3 10*3/uL (ref 0.7–4.0)
MCH: 29.4 pg (ref 26.0–34.0)
MCHC: 31.5 g/dL (ref 30.0–36.0)
MCV: 93.6 fL (ref 80.0–100.0)
Monocytes Absolute: 0.7 10*3/uL (ref 0.1–1.0)
Monocytes Relative: 11 %
Neutro Abs: 3.7 10*3/uL (ref 1.7–7.7)
Neutrophils Relative %: 64 %
Platelet Count: 193 10*3/uL (ref 150–400)
RBC: 3.43 MIL/uL — ABNORMAL LOW (ref 4.22–5.81)
RDW: 12.8 % (ref 11.5–15.5)
WBC Count: 5.8 10*3/uL (ref 4.0–10.5)
nRBC: 0 % (ref 0.0–0.2)

## 2020-06-20 LAB — TSH: TSH: 0.644 u[IU]/mL (ref 0.320–4.118)

## 2020-06-20 LAB — CBG MONITORING, ED
Glucose-Capillary: >600 mg/dL (ref 70–99)
Glucose-Capillary: 381 mg/dL — ABNORMAL HIGH (ref 70–99)

## 2020-06-20 LAB — CMP (CANCER CENTER ONLY)
ALT: 8 U/L (ref 0–44)
AST: 10 U/L — ABNORMAL LOW (ref 15–41)
Albumin: 3.3 g/dL — ABNORMAL LOW (ref 3.5–5.0)
Alkaline Phosphatase: 121 U/L (ref 38–126)
Anion gap: 8 (ref 5–15)
BUN: 19 mg/dL (ref 6–20)
CO2: 24 mmol/L (ref 22–32)
Calcium: 9.3 mg/dL (ref 8.9–10.3)
Chloride: 101 mmol/L (ref 98–111)
Creatinine: 1.53 mg/dL — ABNORMAL HIGH (ref 0.61–1.24)
GFR, Est AFR Am: 57 mL/min — ABNORMAL LOW (ref >60)
GFR, Estimated: 49 mL/min — ABNORMAL LOW (ref >60)
Glucose, Bld: 682 mg/dL (ref 70–99)
Potassium: 4.5 mmol/L (ref 3.5–5.1)
Sodium: 133 mmol/L — ABNORMAL LOW (ref 135–145)
Total Bilirubin: 0.3 mg/dL (ref 0.3–1.2)
Total Protein: 6.7 g/dL (ref 6.5–8.1)

## 2020-06-20 MED ORDER — SODIUM CHLORIDE 0.9 % IV BOLUS
500.0000 mL | Freq: Once | INTRAVENOUS | Status: AC
  Administered 2020-06-20: 500 mL via INTRAVENOUS

## 2020-06-20 MED ORDER — INSULIN ASPART 100 UNIT/ML ~~LOC~~ SOLN
10.0000 [IU] | SUBCUTANEOUS | Status: AC
  Administered 2020-06-20: 10 [IU] via SUBCUTANEOUS
  Filled 2020-06-20: qty 0.1

## 2020-06-20 NOTE — ED Triage Notes (Signed)
He was about to receive a medication for lung cancer called Infinzi at our cancer center. They found his cbg to be in the 580's, so they brought him to the E.D. he arrives in E.D. in no distress and tells [place] he hasn't taken his meds "in a couple of days".

## 2020-06-20 NOTE — Progress Notes (Signed)
Pt transported to [facility] ED for hyperglycemia -report given to RN.

## 2020-06-20 NOTE — Progress Notes (Signed)
Dennis [name]   [phone]  OFFICE PROGRESS NOTE  [name], MD [place] [address]  DIAGNOSIS: Extensive stage small cell lung cancer. He presented with a spiculated right middle lobe pulmonary nodule and mildly enlargedsolitary aortopulmonary lymph node. 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 He was diagnosed in March 2021.  PRIOR THERAPY: 1) Whole brain radiation under the care of Dr. Lisbeth Renshaw. Completed 01/17/2020  CURRENT THERAPY: Systemic chemotherapy withcarboplatin for an AUC of 5 on day 1,etoposide 100 mg/m on days 1, 2, and 3, and Imfinzi 1500 mg on day 1 IV every 3 weeks. First dose expected on4/20/21. Status post 6 cycles.  Starting from cycle #5 the patient will be treated with maintenance Imfinzi 1500 mg IV every 4 weeks.  INTERVAL HISTORY: Dennis [name]. 59 y.o. male returns to the clinic today for follow-up visit.  The patient is feeling fine today with no concerning complaints except for mild fatigue.  He has 1 dizzy spells last week.  He had CT scan of the head with and without contrast performed recently that showed no concerning findings for disease progression in the brain and there was actually improvement of his metastatic disease in the brain.  The patient denied having any chest pain but has shortness of breath with exertion with no cough or hemoptysis.  He denied having any nausea, vomiting, diarrhea or constipation.  Is here today for evaluation before starting cycle #7 of his treatment.   MEDICAL HISTORY: Past Medical History:  Diagnosis Date  . Alcohol abuse with alcohol-induced mood disorder ([place]) 08/18/2015  . Automatic implantable cardioverter-defibrillator in situ 2012   S/P St Jude Dual Chamber. ICD.  Marland Kitchen CAD (coronary artery disease) 11/16/2014   CAD Coronary angiography (12/2008) with mid LAD totally occluded and  collateralized.  . Cardiac LV ejection fraction 10-20%   . Chest pain 09/04/2018  . CHF (congestive heart failure) ([place])   . Chronic systolic heart failure (HCC)    a) Mixed ICM/NICM b) RHC (05/2014): RA 2, RV 19/2/3, PA 22/14 (18), PCWP 6, Fick CO/CI: 5.2 / 2.7, PVR 2.3 WU, PA 60% and 64% c) ECHO (05/2014): EF 20-25%, diff HK, akinesis entireanteroseptal myocardium, triv AI, mod MR, LA mod/sev dilated  . Cocaine abuse ([place]) 01/24/2015  . Cocaine abuse with cocaine-induced mood disorder ([place]) 08/20/2015  . Drug abuse and dependence ([place])   . Dysphagia following cerebral infarction 01/24/2015  . Embolic stroke involving right middle cerebral artery ([place])   . Extensive stage primary small cell carcinoma of lung ([place]) 01/04/2020  . H/O noncompliance with medical treatment, presenting hazards to health 01/24/2015  . Heart murmur   . High cholesterol   . Ischemic cardiomyopathy    a) Coronary angiography (12/2008) at [facility]: [place]: nl, LAD mid 100% stenosis with left to left and right-to-left collaterals to the distal LAD; Lcx: nl, RCA nl.    . 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[place])   . MDD (major depressive disorder), recurrent severe, without psychosis ([place]) 08/18/2015  . Open wound of foot 02/27/2015  . Pneumonia 1988  . Psychoactive substance-induced mood disorder ([place]) 07/17/2015  . Sleep apnea    "cleared after T&A"  . Status post PICC central line placement   . Stroke ([place])   . Substance induced mood disorder ([place]) 08/17/2015  . Type II diabetes mellitus (HCC)     ALLERGIES:  has  No Known Allergies.  MEDICATIONS:  Current Outpatient Medications  Medication Sig Dispense Refill  . Accu-Chek Softclix Lancets lancets     . atorvastatin (LIPITOR) 20 MG tablet TAKE 1 TABLET BY MOUTH DAILY AT 6 PM. (Patient taking differently: Take 20 mg by mouth daily at 6 PM. ) 30 tablet 6  . Blood Glucose Monitoring Suppl (FIFTY50 GLUCOSE METER 2.0) w/Device KIT Use as instructed    . carvedilol (COREG) 6.25 MG  tablet Take 1.5 tablets (9.375 mg total) by mouth 2 (two) times daily with a meal. 90 tablet 3  . Insulin Glargine (LANTUS SOLOSTAR) 100 UNIT/ML Solostar Pen Inject 36 Units into the skin daily at 10 pm.     . INSUPEN ULTRAFIN 31G X 8 MM MISC     . metFORMIN (GLUCOPHAGE-XR) 500 MG 24 hr tablet Take 1,000 mg by mouth daily with breakfast. Per [facility] pharmacist    . ondansetron (ZOFRAN) 8 MG tablet Take 1 tablet (8 mg total) by mouth every 8 (eight) hours as needed for nausea or vomiting. 20 tablet 0  . pantoprazole (PROTONIX) 40 MG tablet Take 1 tablet by mouth daily.    . prochlorperazine (COMPAZINE) 10 MG tablet Take 1 tablet (10 mg total) by mouth every 6 (six) hours as needed. 30 tablet 2  . sacubitril-valsartan (ENTRESTO) 24-26 MG Take 1 tablet by mouth 2 (two) times daily. 60 tablet 3  . spironolactone (ALDACTONE) 25 MG tablet Take 1 tablet (25 mg total) by mouth daily. 90 tablet 3  . warfarin (COUMADIN) 7.5 MG tablet TAKE 1 TABLET BY MOUTH DAILY AT 6PM. 30 tablet 0   No current facility-administered medications for this visit.    SURGICAL HISTORY:  Past Surgical History:  Procedure Laterality Date  . BRONCHIAL BRUSHINGS  12/28/2019   Procedure: BRONCHIAL BRUSHINGS;  Surgeon: Garner Nash, DO;  Location: [facility];  Service: Thoracic;;  . BRONCHIAL NEEDLE ASPIRATION BIOPSY  12/28/2019   Procedure: BRONCHIAL NEEDLE ASPIRATION BIOPSIES;  Surgeon: Garner Nash, DO;  Location: [facility];  Service: Thoracic;;  . BRONCHIAL WASHINGS  12/28/2019   Procedure: BRONCHIAL WASHINGS;  Surgeon: Garner Nash, DO;  Location: [facility];  Service: Thoracic;;  . CARDIAC CATHETERIZATION  05/2014  . CARDIAC DEFIBRILLATOR PLACEMENT  03/2011  . CHOLECYSTECTOMY  1994  . FOREARM FRACTURE SURGERY Left 1980  . FRACTURE SURGERY    . RIGHT HEART CATHETERIZATION N/A 05/30/2014   Procedure: RIGHT HEART CATH;  Surgeon: Jolaine Artist, MD;  Location: [facility] CATH LAB;  Service: Cardiovascular;   Laterality: N/A;  . RIGHT HEART CATHETERIZATION N/A 02/07/2015   Procedure: RIGHT HEART CATH;  Surgeon: Jolaine Artist, MD;  Location: [facility] CATH LAB;  Service: Cardiovascular;  Laterality: N/A;  . TONSILLECTOMY AND ADENOIDECTOMY  ~ 1998  . VIDEO BRONCHOSCOPY WITH ENDOBRONCHIAL ULTRASOUND N/A 12/28/2019   Procedure: VIDEO BRONCHOSCOPY WITH ENDOBRONCHIAL ULTRASOUND;  Surgeon: Garner Nash, DO;  Location: [facility];  Service: Thoracic;  Laterality: N/A;    REVIEW OF SYSTEMS:  A comprehensive review of systems was negative except for: Constitutional: positive for fatigue Respiratory: positive for dyspnea on exertion Neurological: positive for dizziness   PHYSICAL EXAMINATION: General appearance: alert, cooperative and no distress Head: Normocephalic, without obvious abnormality, atraumatic Neck: no adenopathy, no JVD, supple, symmetrical, trachea midline and thyroid not enlarged, symmetric, no tenderness/mass/nodules Lymph nodes: Cervical, supraclavicular, and axillary nodes normal. Resp: clear to auscultation bilaterally Back: symmetric, no curvature. ROM normal. No CVA tenderness. Cardio: regular rate and rhythm, S1, S2  normal, no murmur, click, rub or gallop GI: soft, non-tender; bowel sounds normal; no masses,  no organomegaly Extremities: extremities normal, atraumatic, no cyanosis or edema  ECOG PERFORMANCE STATUS: 1 - Symptomatic but completely ambulatory  Blood pressure (!) 137/94, pulse 96, temperature 97.8 F (36.6 C), temperature source Tympanic, resp. rate 18, height 6' (1.829 m), weight 144 lb 14.4 oz (65.7 kg), SpO2 100 %.  LABORATORY DATA: Lab Results  Component Value Date   WBC 5.8 06/20/2020   HGB 10.1 (L) 06/20/2020   HCT 32.1 (L) 06/20/2020   MCV 93.6 06/20/2020   PLT 193 06/20/2020      Chemistry      Component Value Date/Time   NA 135 05/23/2020 1039   K 4.3 05/23/2020 1039   CL 106 05/23/2020 1039   CO2 22 05/23/2020 1039   BUN 17 05/23/2020 1039    CREATININE 1.35 (H) 05/23/2020 1039      Component Value Date/Time   CALCIUM 9.1 05/23/2020 1039   ALKPHOS 92 05/23/2020 1039   AST 9 (L) 05/23/2020 1039   ALT 7 05/23/2020 1039   BILITOT 0.4 05/23/2020 1039       RADIOGRAPHIC STUDIES: CT Head W Wo Contrast  Result Date: 06/06/2020 CLINICAL DATA:  Restaging of metastatic small cell lung cancer. History of brain radiation. Dizziness with falls recently. EXAM: CT HEAD WITHOUT AND WITH CONTRAST TECHNIQUE: Contiguous axial images were obtained from the base of the skull through the vertex without and with intravenous contrast CONTRAST:  63m OMNIPAQUE IOHEXOL 300 MG/ML  SOLN COMPARISON:  12/23/2019 FINDINGS: Brain: The enhancing right frontal mass on the prior CT is no longer evident, and associated edema has resolved. No new enhancing lesions are identified. No acute infarct, intracranial hemorrhage, midline shift, or extra-axial fluid collection is evident. A moderate-sized chronic right MCA infarct is again noted involving the frontal lobe and insula. There is mild ex vacuo dilatation of the right lateral ventricle. Vascular: Calcified atherosclerosis at the skull base. The major dural venous sinuses and large arteries at the base of the brain are grossly patent. Skull: No fracture or suspicious osseous lesion. Sinuses/Orbits: Visualized paranasal sinuses and mastoid air cells are clear. Visualized portions of the orbits are unremarkable. Other: None. IMPRESSION: 1. Interval resolution of right frontal metastasis and edema. 2. No evidence of new intracranial metastases or acute abnormality. 3. Chronic right MCA infarct. Electronically Signed   By: ALogan BoresM.D.   On: 06/06/2020 16:04    ASSESSMENT AND PLAN: This is a very pleasant 59years old African-American male recently diagnosed with extensive stage small cell lung cancer presented with spiculated right middle lobe pulmonary nodule in addition to mediastinal lymphadenopathy and multiple  metastatic brain lesions.  The patient is status post whole brain irradiation. He is currently undergoing systemic chemotherapy with carboplatin, etoposide and Imfinzi status post 6 cycles.  Starting from cycle #5 the patient is on maintenance treatment with immunotherapy with Imfinzi every 4 weeks. He has been tolerating this treatment well with no concerning adverse effects. His most recent CT scan of the head showed no concerning findings for disease progression and there was resolution of the right frontal metastasis and the edema. I recommended for the patient to proceed with cycle #7 of his treatment with Imfinzi today. I will see him back for follow-up visit in 4 weeks for evaluation after repeating CT scan of the chest, abdomen pelvis for restaging of his disease. The patient was advised to call immediately if he  has any concerning symptoms in the interval. The patient voices understanding of current disease status and treatment options and is in agreement with the current care plan.  All questions were answered. The patient knows to call the clinic with any problems, questions or concerns. We can certainly see the patient much sooner if necessary.  Disclaimer: This note was dictated with voice recognition software. Similar sounding words can inadvertently be transcribed and may not be corrected upon review.

## 2020-06-20 NOTE — Discharge Instructions (Signed)
Please follow-up with your primary care doctor as soon as you are able to. Please continue take your Lantus.  Please check your blood sugar once daily. You may return to the emergency department for any new or concerning symptoms.  If your blood sugars are dropping below 100 I recommend holding off on the Lantus and see your primary care doctor more quickly in order to facilitate changes in your insulin management.

## 2020-06-20 NOTE — ED Provider Notes (Signed)
[facility] DEPT Provider Note   CSN: [id number] Arrival date & time: 06/20/20  1207     History Chief Complaint  Patient presents with  . Lung Cancer  . Hyperglycemia    Dennis [name]. is a 59 y.o. male.  HPI   Patient is a 59 year old male with a past history significant for CHF, HTN, DM 2, HLD, small cell lung cancer with mets to the brain.  Patient is followed by the [facility] health oncology center and was seen earlier today to have routine labs drawn he has no symptoms today other than some urinary frequency which he states is consistent with when he has elevated blood sugar.  He states that he uses insulin and Metformin daily.  He has been taking his Metformin regularly but states he has not taken insulin and 2 days-3 days.  He states that he does not check his blood sugar at home and generally only takes his insulin when he has been more frequently however states that even though he has been peeing more frequently he has not been taking his insulin.  Patient is not entirely sure why but does state that he planned to take his insulin this evening.  He went to the oncology office this morning he had blood work drawn which showed significantly elevated blood sugar of 682--he has no anion gap.  His sodium corrects to normal when corrected for glucose.     Past Medical History:  Diagnosis Date  . Alcohol abuse with alcohol-induced mood disorder ([place]) 08/18/2015  . Automatic implantable cardioverter-defibrillator in situ 2012   S/P St Jude Dual Chamber. ICD.  Marland Kitchen CAD (coronary artery disease) 11/16/2014   CAD Coronary angiography (12/2008) with mid LAD totally occluded and collateralized.  . Cardiac LV ejection fraction 10-20%   . Chest pain 09/04/2018  . CHF (congestive heart failure) ([place])   . Chronic systolic heart failure (HCC)    a) Mixed ICM/NICM b) RHC (05/2014): RA 2, RV 19/2/3, PA 22/14 (18), PCWP 6, Fick CO/CI: 5.2 / 2.7, PVR 2.3 WU, PA 60% and  64% c) ECHO (05/2014): EF 20-25%, diff HK, akinesis entireanteroseptal myocardium, triv AI, mod MR, LA mod/sev dilated  . Cocaine abuse ([place]) 01/24/2015  . Cocaine abuse with cocaine-induced mood disorder ([place]) 08/20/2015  . Drug abuse and dependence ([place])   . Dysphagia following cerebral infarction 01/24/2015  . Embolic stroke involving right middle cerebral artery ([place])   . Extensive stage primary small cell carcinoma of lung ([place]) 01/04/2020  . H/O noncompliance with medical treatment, presenting hazards to health 01/24/2015  . Heart murmur   . High cholesterol   . Ischemic cardiomyopathy    a) Coronary angiography (12/2008) at [facility]: Lmain: nl, LAD mid 100% stenosis with left to left and right-to-left collaterals to the distal LAD; Lcx: nl, RCA nl.    . 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[place])   . MDD (major depressive disorder), recurrent severe, without psychosis ([place]) 08/18/2015  . Open wound of foot 02/27/2015  . Pneumonia 1988  . Psychoactive substance-induced mood disorder ([place]) 07/17/2015  . Sleep apnea    "cleared after T&A"  . Status post PICC central line placement   . Stroke ([place])   . Substance induced mood disorder ([place]) 08/17/2015  . Type II diabetes mellitus [facility])     Patient Active Problem List   Diagnosis Date Noted  . Small cell lung cancer, right upper lobe ([place]) 01/19/2020  . Encounter for antineoplastic chemotherapy 01/19/2020  . Encounter  for antineoplastic immunotherapy 01/19/2020  . Extensive stage primary small cell carcinoma of lung ([place]) 01/04/2020  . Goals of care, counseling/discussion 01/04/2020  . Mediastinal adenopathy   . Mass of upper lobe of right lung   . Brain mass 12/23/2019  . Vasogenic cerebral edema ([place]) 12/23/2019  . Left leg paresthesias   . History of ischemic right MCA stroke   . Left-sided weakness   . 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[place])   . Tobacco abuse 01/24/2015  . Essential hypertension 01/24/2015  . Left hemiplegia ([place])  01/24/2015  . Hyperlipidemia associated with type 2 diabetes mellitus ([place]) 11/30/2014  . Chronic systolic heart failure ([place]) 06/20/2014  . Automatic implantable cardioverter-defibrillator in situ   . Long term (current) use of anticoagulants 08/09/2013  . Drug abuse and dependence ([place])   . Cardiomyopathy, ischemic 07/21/2011  . Type II diabetes mellitus with ophthalmic manifestations ([place]) 09/13/2007    Past Surgical History:  Procedure Laterality Date  . BRONCHIAL BRUSHINGS  12/28/2019   Procedure: BRONCHIAL BRUSHINGS;  Surgeon: Garner Nash, DO;  Location: [facility];  Service: Thoracic;;  . BRONCHIAL NEEDLE ASPIRATION BIOPSY  12/28/2019   Procedure: BRONCHIAL NEEDLE ASPIRATION BIOPSIES;  Surgeon: Garner Nash, DO;  Location: [facility];  Service: Thoracic;;  . BRONCHIAL WASHINGS  12/28/2019   Procedure: BRONCHIAL WASHINGS;  Surgeon: Garner Nash, DO;  Location: [facility];  Service: Thoracic;;  . CARDIAC CATHETERIZATION  05/2014  . CARDIAC DEFIBRILLATOR PLACEMENT  03/2011  . CHOLECYSTECTOMY  1994  . FOREARM FRACTURE SURGERY Left 1980  . FRACTURE SURGERY    . RIGHT HEART CATHETERIZATION N/A 05/30/2014   Procedure: RIGHT HEART CATH;  Surgeon: Jolaine Artist, MD;  Location: [facility] CATH LAB;  Service: Cardiovascular;  Laterality: N/A;  . RIGHT HEART CATHETERIZATION N/A 02/07/2015   Procedure: RIGHT HEART CATH;  Surgeon: Jolaine Artist, MD;  Location: [facility] CATH LAB;  Service: Cardiovascular;  Laterality: N/A;  . TONSILLECTOMY AND ADENOIDECTOMY  ~ 1998  . VIDEO BRONCHOSCOPY WITH ENDOBRONCHIAL ULTRASOUND N/A 12/28/2019   Procedure: VIDEO BRONCHOSCOPY WITH ENDOBRONCHIAL ULTRASOUND;  Surgeon: Garner Nash, DO;  Location: [facility] END[facility];  Service: Thoracic;  Laterality: N/A;       Family History  Problem Relation Age of Onset  . Diabetes Mother   . Heart disease Mother   . Diabetes Father   . Prostate cancer Father   . Heart disease Father   . Diabetes Brother   .  Diabetes Brother     Social History   Tobacco Use  . Smoking status: Current Some Day Smoker    Packs/day: 0.25    Years: 31.00    Pack years: 7.75    Types: Cigarettes  . Smokeless tobacco: Never Used  Vaping Use  . Vaping Use: Never used  Substance Use Topics  . Alcohol use: Yes    Alcohol/week: 0.0 standard drinks    Comment: 09/21/2014 "might have a drink q 6 /months, if that"  . Drug use: Not Currently    Types: Cocaine    Comment: clean since 2016    Home Medications Prior to Admission medications   Medication Sig Start Date End Date Taking? Authorizing Provider  atorvastatin (LIPITOR) 20 MG tablet TAKE 1 TABLET BY MOUTH DAILY AT 6 PM. Patient taking differently: Take 20 mg by mouth daily at 6 PM.  03/11/17  Yes Bensimhon, Shaune Pascal, MD  carvedilol (COREG) 6.25 MG tablet Take 1.5 tablets (9.375 mg total) by mouth 2 (two) times daily with  a meal. 08/05/19  Yes Bensimhon, Shaune Pascal, MD  Insulin Glargine (LANTUS SOLOSTAR) 100 UNIT/ML Solostar Pen Inject 36 Units into the skin daily at 10 pm.    Yes [provider]  metFORMIN (GLUCOPHAGE-XR) 500 MG 24 hr tablet Take 1,000 mg by mouth daily with breakfast. Per [facility] pharmacist   Yes Posey Pronto, Nilay, DO  sacubitril-valsartan (ENTRESTO) 24-26 MG Take 1 tablet by mouth 2 (two) times daily. 01/24/20  Yes Bensimhon, Shaune Pascal, MD  spironolactone (ALDACTONE) 25 MG tablet Take 1 tablet (25 mg total) by mouth daily. 01/02/20  Yes Bensimhon, Shaune Pascal, MD  warfarin (COUMADIN) 7.5 MG tablet TAKE 1 TABLET BY MOUTH DAILY AT 6PM. Patient taking differently: Take 7.5 mg by mouth daily. TAKE 1 TABLET BY MOUTH DAILY AT 6PM. 01/20/20  Yes Bensimhon, Shaune Pascal, MD  Accu-Chek Softclix Lancets lancets  02/03/20   [provider]  Blood Glucose Monitoring Suppl (FIFTY50 GLUCOSE METER 2.0) w/Device KIT Use as instructed 02/03/20   [provider]  INSUPEN ULTRAFIN 31G X 8 MM [place]  03/02/20   [provider]  ondansetron (ZOFRAN) 8  MG tablet Take 1 tablet (8 mg total) by mouth every 8 (eight) hours as needed for nausea or vomiting. Patient not taking: Reported on 06/20/2020 02/21/20   Curt Bears, MD  prochlorperazine (COMPAZINE) 10 MG tablet Take 1 tablet (10 mg total) by mouth every 6 (six) hours as needed. Patient not taking: Reported on 06/20/2020 01/19/20   Heilingoetter, Cassandra L, PA-C    Allergies    Patient has no known allergies.  Review of Systems   Review of Systems  Constitutional: Negative for chills and fever.  HENT: Negative for congestion.   Eyes: Negative for pain.  Respiratory: Negative for cough and shortness of breath.   Cardiovascular: Negative for chest pain and leg swelling.  Gastrointestinal: Negative for abdominal pain and vomiting.  Genitourinary: Positive for frequency. Negative for dysuria.  Musculoskeletal: Negative for myalgias.  Skin: Negative for rash.  Neurological: Negative for dizziness and headaches.    Physical Exam Updated Vital Signs BP (!) 182/111 (BP Location: Right Arm)   Pulse 65   Temp 97.6 F (36.4 C) (Oral)   Resp 16   SpO2 100%   Physical Exam Vitals and nursing note reviewed.  Constitutional:      General: He is not in acute distress.    Appearance: Normal appearance. He is not ill-appearing.  HENT:     Head: Normocephalic and atraumatic.     Mouth/Throat:     Comments: Slightly dry oral mucosa Eyes:     General: No scleral icterus.       Right eye: No discharge.        Left eye: No discharge.     Conjunctiva/sclera: Conjunctivae normal.  Pulmonary:     Effort: Pulmonary effort is normal.     Breath sounds: Normal breath sounds. No stridor. No wheezing.  Abdominal:     General: There is no distension.     Tenderness: There is no abdominal tenderness. There is no guarding or rebound.  Neurological:     Mental Status: He is alert and oriented to person, place, and time. Mental status is at baseline.  Psychiatric:        Mood and Affect: Mood  normal.        Behavior: Behavior normal.     ED Results / Procedures / Treatments   Labs (all labs ordered are listed, but only abnormal results are  displayed) Labs Reviewed  CBG MONITORING, ED - Abnormal; Notable for the following components:      Result Value   Glucose-Capillary >600 (*)    All other components within normal limits  CBG MONITORING, ED - Abnormal; Notable for the following components:   Glucose-Capillary 381 (*)    All other components within normal limits    EKG None  Radiology No results found.  Procedures Procedures (including critical care time)  Medications Ordered in ED Medications  insulin aspart (novoLOG) injection 10 Units (10 Units Subcutaneous Given 06/20/20 1318)  sodium chloride 0.9 % bolus 500 mL (0 mLs Intravenous Stopped 06/20/20 1456)    ED Course  I have reviewed the triage vital signs and the nursing notes.  Pertinent labs & imaging results that were available during my care of the patient were reviewed by me and considered in my medical decision making (see chart for details).    MDM Rules/Calculators/A&P                          Patient is 59 year old gentleman cancer history detailed in HPI presented today for hyperglycemia.  Vital signs are within normal limits on my examination he is not tachycardic denies any symptoms at all.  CBC was done at 10:51 AM this morning.  No leukocytosis or anemia.  CMP with 682 blood sugar, sodium is 133 corrects to normal with Hillier equation.  I discussed this case with my attending physician who cosigned this note including patient's presenting symptoms, physical exam, and planned diagnostics and interventions. Attending physician stated agreement with plan or made changes to plan which were implemented.   We will provide patient with 10 of insulin subcu and fluids.  Patient repeat CBG 381.  Continues to be asymptomatic at this time.  Discharge at this time he has better understanding of his  disease at this time and assures me that he will take his Lantus as prescribed and follow-up with his PCP for diabetes management and education.  The medical records were personally reviewed by myself. I personally reviewed all lab results and interpreted all imaging studies and either concurred with their official read or contacted radiology for clarification. Additional history obtained from old records  This patient appears reasonably screened and I doubt any other medical condition requiring further workup, evaluation, or treatment in the ED at this time prior to discharge.   Patient's vitals are WNL apart from vital sign abnormalities discussed above, patient is in NAD, and able to ambulate in the ED at their baseline and able to tolerate PO.  Pain has been managed or a plan has been made for home management and has no complaints prior to discharge. Patient is comfortable with above plan and for discharge at this time. All questions were answered prior to disposition. Results from the ER workup discussed with the patient face to face and all questions answered to the best of my ability. The patient is safe for discharge with strict return precautions. Patient appears safe for discharge with appropriate follow-up. Conveyed my impression with the patient and they voiced understanding and are agreeable to plan.   An After Visit Summary was printed and given to the patient.  Portions of this note were generated with Lobbyist. Dictation errors may occur despite best attempts at proofreading.    Final Clinical Impression(s) / ED Diagnoses Final diagnoses:  Hyperglycemia    Rx / DC Orders ED Discharge Orders  None       Tedd Sias, [place] 06/20/20 2229    Carmin Muskrat, MD 06/21/20 1655

## 2020-06-21 ENCOUNTER — Inpatient Hospital Stay: Payer: Medicare Other

## 2020-06-21 VITALS — BP 107/76 | HR 95 | Temp 97.8°F | Resp 20

## 2020-06-21 DIAGNOSIS — C342 Malignant neoplasm of middle lobe, bronchus or lung: Secondary | ICD-10-CM | POA: Diagnosis present

## 2020-06-21 DIAGNOSIS — Z5112 Encounter for antineoplastic immunotherapy: Secondary | ICD-10-CM | POA: Diagnosis not present

## 2020-06-21 DIAGNOSIS — C3411 Malignant neoplasm of upper lobe, right bronchus or lung: Secondary | ICD-10-CM

## 2020-06-21 MED ORDER — SODIUM CHLORIDE 0.9 % IV SOLN
Freq: Once | INTRAVENOUS | Status: AC
  Filled 2020-06-21: qty 250

## 2020-06-21 MED ORDER — SODIUM CHLORIDE 0.9 % IV SOLN
1500.0000 mg | Freq: Once | INTRAVENOUS | Status: AC
  Administered 2020-06-21: 1500 mg via INTRAVENOUS
  Filled 2020-06-21: qty 30

## 2020-06-21 NOTE — Patient Instructions (Signed)
[facility] Discharge Instructions for Patients Receiving Chemotherapy  Today you received the following immunotherapy agent: Imfinzi  To help prevent nausea and vomiting after your treatment, we encourage you to take your nausea medication as directed by your MD.   If you develop nausea and vomiting that is not controlled by your nausea medication, call the clinic.   BELOW ARE SYMPTOMS THAT SHOULD BE REPORTED IMMEDIATELY:  *FEVER GREATER THAN 100.5 F  *CHILLS WITH OR WITHOUT FEVER  NAUSEA AND VOMITING THAT IS NOT CONTROLLED WITH YOUR NAUSEA MEDICATION  *UNUSUAL SHORTNESS OF BREATH  *UNUSUAL BRUISING OR BLEEDING  TENDERNESS IN MOUTH AND THROAT WITH OR WITHOUT PRESENCE OF ULCERS  *URINARY PROBLEMS  *BOWEL PROBLEMS  UNUSUAL RASH Items with * indicate a potential emergency and should be followed up as soon as possible.  Feel free to call the clinic should you have any questions or concerns. The clinic phone number is [phone].  Please show the [facility] at check-in to the Emergency Department and triage nurse.  Durvalumab injection What is this medicine? DURVALUMAB (dur VAL ue mab) is a monoclonal antibody. It is used to treat urothelial cancer and lung cancer. This medicine may be used for other purposes; ask your health care provider or pharmacist if you have questions. COMMON BRAND NAME(S): IMFINZI What should I tell my health care provider before I take this medicine? They need to know if you have any of these conditions:  diabetes  immune system problems  infection  inflammatory bowel disease  kidney disease  liver disease  lung or breathing disease  lupus  organ transplant  stomach or intestine problems  thyroid disease  an unusual or allergic reaction to durvalumab, other medicines, foods, dyes, or preservatives  pregnant or trying to get pregnant  breast-feeding How should I use this medicine? This medicine is for  infusion into a vein. It is given by a health care professional in a hospital or clinic setting. A special MedGuide will be given to you before each treatment. Be sure to read this information carefully each time. Talk to your pediatrician regarding the use of this medicine in children. Special care may be needed. Overdosage: If you think you have taken too much of this medicine contact a poison control center or emergency room at once. NOTE: This medicine is only for you. Do not share this medicine with others. What if I miss a dose? It is important not to miss your dose. Call your doctor or health care professional if you are unable to keep an appointment. What may interact with this medicine? Interactions have not been studied. This list may not describe all possible interactions. Give your health care provider a list of all the medicines, herbs, non-prescription drugs, or dietary supplements you use. Also tell them if you smoke, drink alcohol, or use illegal drugs. Some items may interact with your medicine. What should I watch for while using this medicine? This drug may make you feel generally unwell. Continue your course of treatment even though you feel ill unless your doctor tells you to stop. You may need blood work done while you are taking this medicine. Do not become pregnant while taking this medicine or for 3 months after stopping it. Women should inform their doctor if they wish to become pregnant or think they might be pregnant. There is a potential for serious side effects to an unborn child. Talk to your health care professional or pharmacist for more information.  Do not breast-feed an infant while taking this medicine or for 3 months after stopping it. What side effects may I notice from receiving this medicine? Side effects that you should report to your doctor or health care professional as soon as possible:  allergic reactions like skin rash, itching or hives, swelling of the  face, lips, or tongue  black, tarry stools  bloody or watery diarrhea  breathing problems  change in emotions or moods  change in sex drive  changes in vision  chest pain or chest tightness  chills  confusion  cough  facial flushing  fever  headache  signs and symptoms of high blood sugar such as dizziness; dry mouth; dry skin; fruity breath; nausea; stomach pain; increased hunger or thirst; increased urination  signs and symptoms of liver injury like dark yellow or brown urine; general ill feeling or flu-like symptoms; light-colored stools; loss of appetite; nausea; right upper belly pain; unusually weak or tired; yellowing of the eyes or skin  stomach pain  trouble passing urine or change in the amount of urine  weight gain or weight loss Side effects that usually do not require medical attention (report these to your doctor or health care professional if they continue or are bothersome):  bone pain  constipation  loss of appetite  muscle pain  nausea  swelling of the ankles, feet, hands  tiredness This list may not describe all possible side effects. Call your doctor for medical advice about side effects. You may report side effects to FDA at 1-800-FDA-1088. Where should I keep my medicine? This drug is given in a hospital or clinic and will not be stored at home. NOTE: This sheet is a summary. It may not cover all possible information. If you have questions about this medicine, talk to your doctor, pharmacist, or health care provider.  2020 Elsevier/Gold Standard (2016-12-09 19:25:04)

## 2020-07-04 ENCOUNTER — Telehealth: Payer: Self-pay | Admitting: Medical Oncology

## 2020-07-04 NOTE — Telephone Encounter (Signed)
LVM to call central scheduling to schedule CT scan.

## 2020-07-07 ENCOUNTER — Inpatient Hospital Stay (HOSPITAL_COMMUNITY)
Admission: EM | Admit: 2020-07-07 | Discharge: 2020-07-25 | DRG: 312 | Disposition: A | Discharge status: Home Health | Payer: Medicare Other | Attending: Internal Medicine | Admitting: Internal Medicine

## 2020-07-07 ENCOUNTER — Encounter (HOSPITAL_COMMUNITY): Payer: Self-pay

## 2020-07-07 ENCOUNTER — Emergency Department (HOSPITAL_COMMUNITY): Payer: Medicare Other

## 2020-07-07 DIAGNOSIS — F1490 Cocaine use, unspecified, uncomplicated: Secondary | ICD-10-CM

## 2020-07-07 DIAGNOSIS — I5042 Chronic combined systolic (congestive) and diastolic (congestive) heart failure: Secondary | ICD-10-CM | POA: Diagnosis present

## 2020-07-07 DIAGNOSIS — I10 Essential (primary) hypertension: Secondary | ICD-10-CM | POA: Diagnosis present

## 2020-07-07 DIAGNOSIS — D638 Anemia in other chronic diseases classified elsewhere: Secondary | ICD-10-CM | POA: Diagnosis present

## 2020-07-07 DIAGNOSIS — Z95810 Presence of automatic (implantable) cardiac defibrillator: Secondary | ICD-10-CM

## 2020-07-07 DIAGNOSIS — Z681 Body mass index (BMI) 19 or less, adult: Secondary | ICD-10-CM

## 2020-07-07 DIAGNOSIS — E1169 Type 2 diabetes mellitus with other specified complication: Secondary | ICD-10-CM | POA: Diagnosis present

## 2020-07-07 DIAGNOSIS — Z9049 Acquired absence of other specified parts of digestive tract: Secondary | ICD-10-CM

## 2020-07-07 DIAGNOSIS — R4781 Slurred speech: Secondary | ICD-10-CM | POA: Diagnosis present

## 2020-07-07 DIAGNOSIS — I69354 Hemiplegia and hemiparesis following cerebral infarction affecting left non-dominant side: Secondary | ICD-10-CM

## 2020-07-07 DIAGNOSIS — Z9114 Patient's other noncompliance with medication regimen: Secondary | ICD-10-CM

## 2020-07-07 DIAGNOSIS — I313 Pericardial effusion (noninflammatory): Secondary | ICD-10-CM | POA: Diagnosis present

## 2020-07-07 DIAGNOSIS — I951 Orthostatic hypotension: Principal | ICD-10-CM | POA: Diagnosis present

## 2020-07-07 DIAGNOSIS — E440 Moderate protein-calorie malnutrition: Secondary | ICD-10-CM | POA: Diagnosis present

## 2020-07-07 DIAGNOSIS — C3411 Malignant neoplasm of upper lobe, right bronchus or lung: Secondary | ICD-10-CM | POA: Diagnosis present

## 2020-07-07 DIAGNOSIS — C3490 Malignant neoplasm of unspecified part of unspecified bronchus or lung: Secondary | ICD-10-CM

## 2020-07-07 DIAGNOSIS — Z794 Long term (current) use of insulin: Secondary | ICD-10-CM

## 2020-07-07 DIAGNOSIS — I2510 Atherosclerotic heart disease of native coronary artery without angina pectoris: Secondary | ICD-10-CM | POA: Diagnosis present

## 2020-07-07 DIAGNOSIS — I440 Atrioventricular block, first degree: Secondary | ICD-10-CM | POA: Diagnosis present

## 2020-07-07 DIAGNOSIS — Z20822 Contact with and (suspected) exposure to covid-19: Secondary | ICD-10-CM | POA: Diagnosis present

## 2020-07-07 DIAGNOSIS — E785 Hyperlipidemia, unspecified: Secondary | ICD-10-CM | POA: Diagnosis present

## 2020-07-07 DIAGNOSIS — Z79899 Other long term (current) drug therapy: Secondary | ICD-10-CM

## 2020-07-07 DIAGNOSIS — I69391 Dysphagia following cerebral infarction: Secondary | ICD-10-CM

## 2020-07-07 DIAGNOSIS — R471 Dysarthria and anarthria: Secondary | ICD-10-CM

## 2020-07-07 DIAGNOSIS — R627 Adult failure to thrive: Secondary | ICD-10-CM | POA: Diagnosis present

## 2020-07-07 DIAGNOSIS — R55 Syncope and collapse: Secondary | ICD-10-CM | POA: Diagnosis present

## 2020-07-07 DIAGNOSIS — G9341 Metabolic encephalopathy: Secondary | ICD-10-CM | POA: Diagnosis present

## 2020-07-07 DIAGNOSIS — C7931 Secondary malignant neoplasm of brain: Secondary | ICD-10-CM | POA: Diagnosis present

## 2020-07-07 DIAGNOSIS — R2681 Unsteadiness on feet: Secondary | ICD-10-CM | POA: Diagnosis present

## 2020-07-07 DIAGNOSIS — E7800 Pure hypercholesterolemia, unspecified: Secondary | ICD-10-CM | POA: Diagnosis present

## 2020-07-07 DIAGNOSIS — I110 Hypertensive heart disease with heart failure: Secondary | ICD-10-CM | POA: Diagnosis present

## 2020-07-07 DIAGNOSIS — I428 Other cardiomyopathies: Secondary | ICD-10-CM | POA: Diagnosis present

## 2020-07-07 DIAGNOSIS — Z515 Encounter for palliative care: Secondary | ICD-10-CM

## 2020-07-07 DIAGNOSIS — Z7901 Long term (current) use of anticoagulants: Secondary | ICD-10-CM

## 2020-07-07 DIAGNOSIS — F17210 Nicotine dependence, cigarettes, uncomplicated: Secondary | ICD-10-CM | POA: Diagnosis present

## 2020-07-07 DIAGNOSIS — F1410 Cocaine abuse, uncomplicated: Secondary | ICD-10-CM

## 2020-07-07 DIAGNOSIS — R4701 Aphasia: Secondary | ICD-10-CM | POA: Diagnosis present

## 2020-07-07 DIAGNOSIS — Z5181 Encounter for therapeutic drug level monitoring: Secondary | ICD-10-CM

## 2020-07-07 DIAGNOSIS — I5022 Chronic systolic (congestive) heart failure: Secondary | ICD-10-CM | POA: Diagnosis present

## 2020-07-07 DIAGNOSIS — Z833 Family history of diabetes mellitus: Secondary | ICD-10-CM

## 2020-07-07 DIAGNOSIS — E1165 Type 2 diabetes mellitus with hyperglycemia: Secondary | ICD-10-CM

## 2020-07-07 DIAGNOSIS — Z9221 Personal history of antineoplastic chemotherapy: Secondary | ICD-10-CM

## 2020-07-07 LAB — URINALYSIS, ROUTINE W REFLEX MICROSCOPIC
Bilirubin Urine: NEGATIVE
Glucose, UA: 50 mg/dL — AB
Hgb urine dipstick: NEGATIVE
Ketones, ur: NEGATIVE mg/dL
Leukocytes,Ua: NEGATIVE
Nitrite: NEGATIVE
Protein, ur: 100 mg/dL — AB
Specific Gravity, Urine: 1.014 (ref 1.005–1.030)
pH: 5 (ref 5.0–8.0)

## 2020-07-07 LAB — PROTIME-INR
INR: 1.1 (ref 0.8–1.2)
Prothrombin Time: 13.7 seconds (ref 11.4–15.2)

## 2020-07-07 LAB — COMPREHENSIVE METABOLIC PANEL
ALT: 17 U/L (ref 0–44)
AST: 20 U/L (ref 15–41)
Albumin: 3.2 g/dL — ABNORMAL LOW (ref 3.5–5.0)
Alkaline Phosphatase: 88 U/L (ref 38–126)
Anion gap: 13 (ref 5–15)
BUN: 23 mg/dL — ABNORMAL HIGH (ref 6–20)
CO2: 28 mmol/L (ref 22–32)
Calcium: 9.7 mg/dL (ref 8.9–10.3)
Chloride: 95 mmol/L — ABNORMAL LOW (ref 98–111)
Creatinine, Ser: 1.33 mg/dL — ABNORMAL HIGH (ref 0.61–1.24)
GFR calc Af Amer: >60 mL/min (ref >60)
GFR calc non Af Amer: 59 mL/min — ABNORMAL LOW (ref >60)
Glucose, Bld: 188 mg/dL — ABNORMAL HIGH (ref 70–99)
Potassium: 4.6 mmol/L (ref 3.5–5.1)
Sodium: 136 mmol/L (ref 135–145)
Total Bilirubin: 0.8 mg/dL (ref 0.3–1.2)
Total Protein: 7.4 g/dL (ref 6.5–8.1)

## 2020-07-07 LAB — CBC
HCT: 30.4 % — ABNORMAL LOW (ref 39.0–52.0)
Hemoglobin: 9.8 g/dL — ABNORMAL LOW (ref 13.0–17.0)
MCH: 28.8 pg (ref 26.0–34.0)
MCHC: 32.2 g/dL (ref 30.0–36.0)
MCV: 89.4 fL (ref 80.0–100.0)
Platelets: 319 10*3/uL (ref 150–400)
RBC: 3.4 MIL/uL — ABNORMAL LOW (ref 4.22–5.81)
RDW: 12.7 % (ref 11.5–15.5)
WBC: 7.4 10*3/uL (ref 4.0–10.5)
nRBC: 0 % (ref 0.0–0.2)

## 2020-07-07 LAB — RAPID URINE DRUG SCREEN, HOSP PERFORMED
Amphetamines: NOT DETECTED
Barbiturates: NOT DETECTED
Benzodiazepines: NOT DETECTED
Cocaine: POSITIVE — AB
Opiates: NOT DETECTED
Tetrahydrocannabinol: NOT DETECTED

## 2020-07-07 LAB — RESPIRATORY PANEL BY RT PCR (FLU A&B, COVID)
Influenza A by PCR: NEGATIVE
Influenza B by PCR: NEGATIVE
SARS Coronavirus 2 by RT PCR: NEGATIVE

## 2020-07-07 IMAGING — DX DG CHEST 1V PORT
2 series · 2 of 2 positions shown · non-contrast
Comparison: Chest radiograph [DATE].

CLINICAL DATA: Chills and weakness.  History of lung cancer.

EXAM:
PORTABLE CHEST 1 VIEW

[chest ap (1 of 2)]
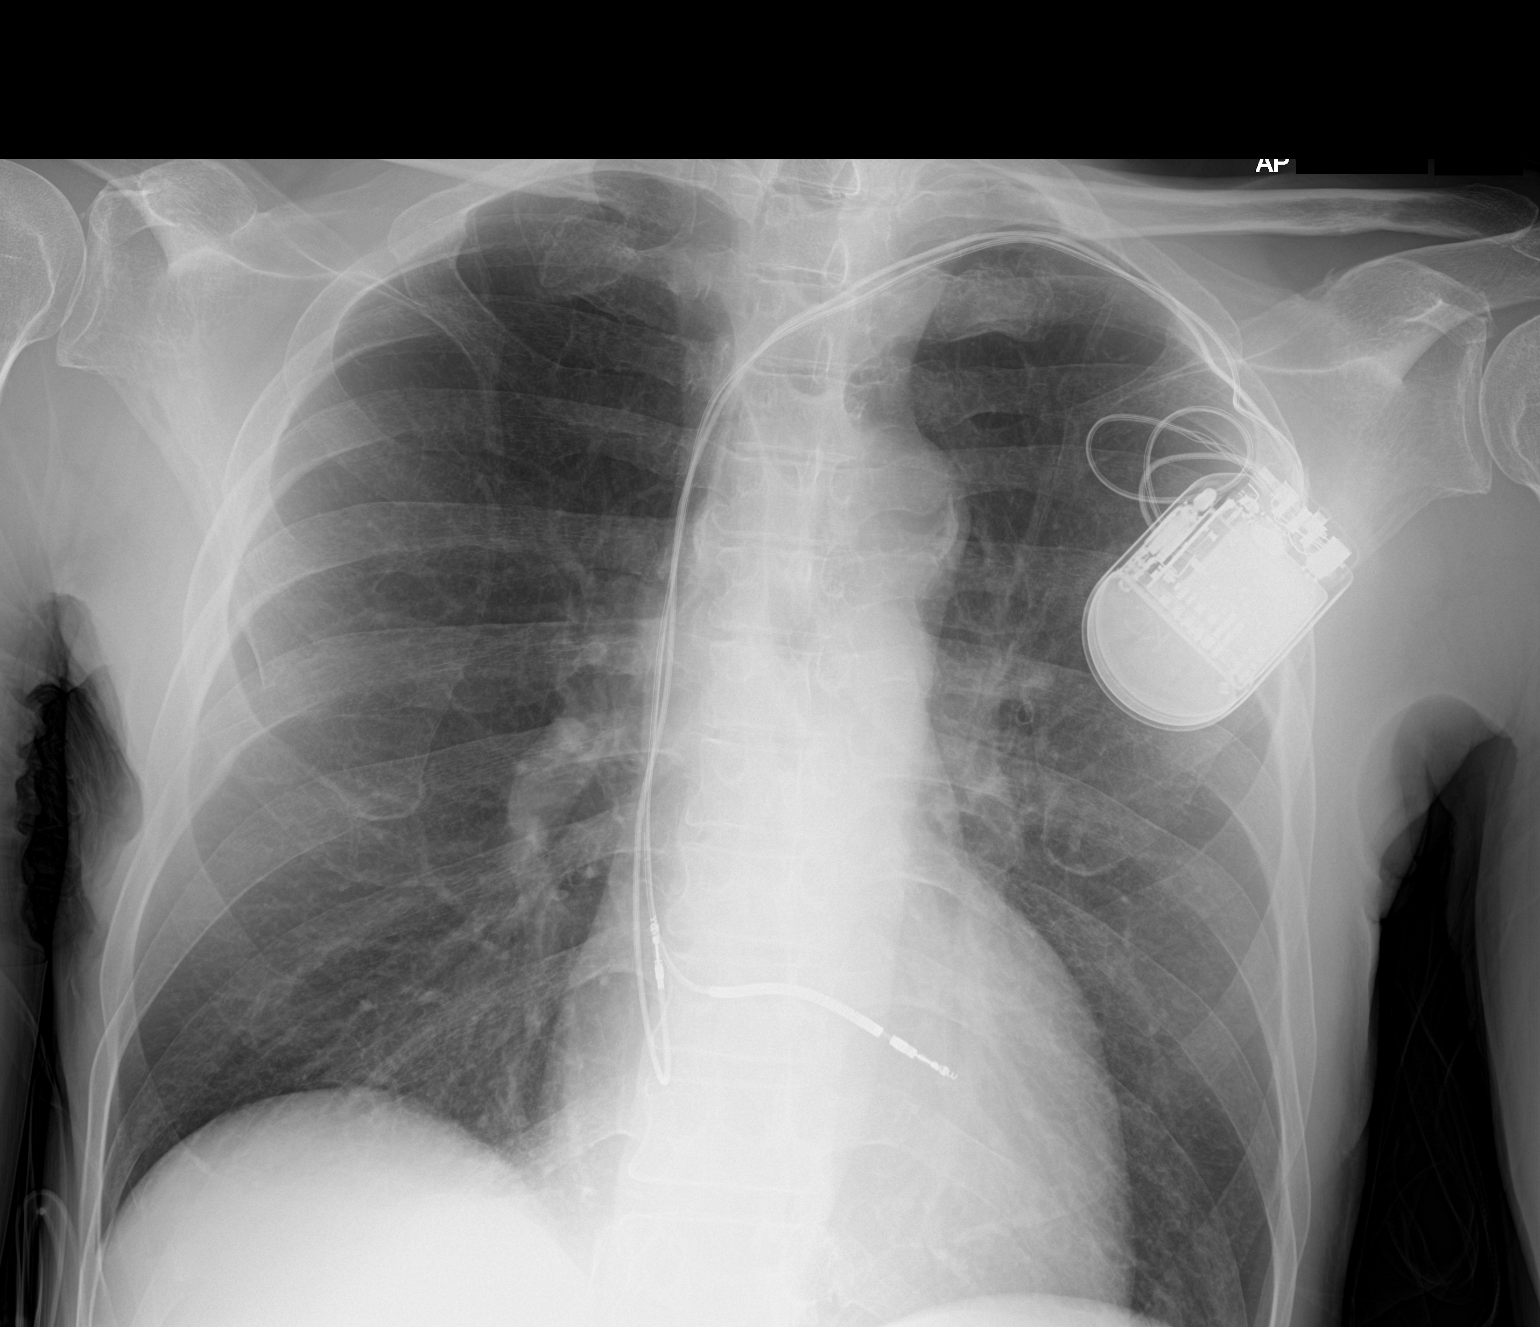

[chest ap (2 of 2)]
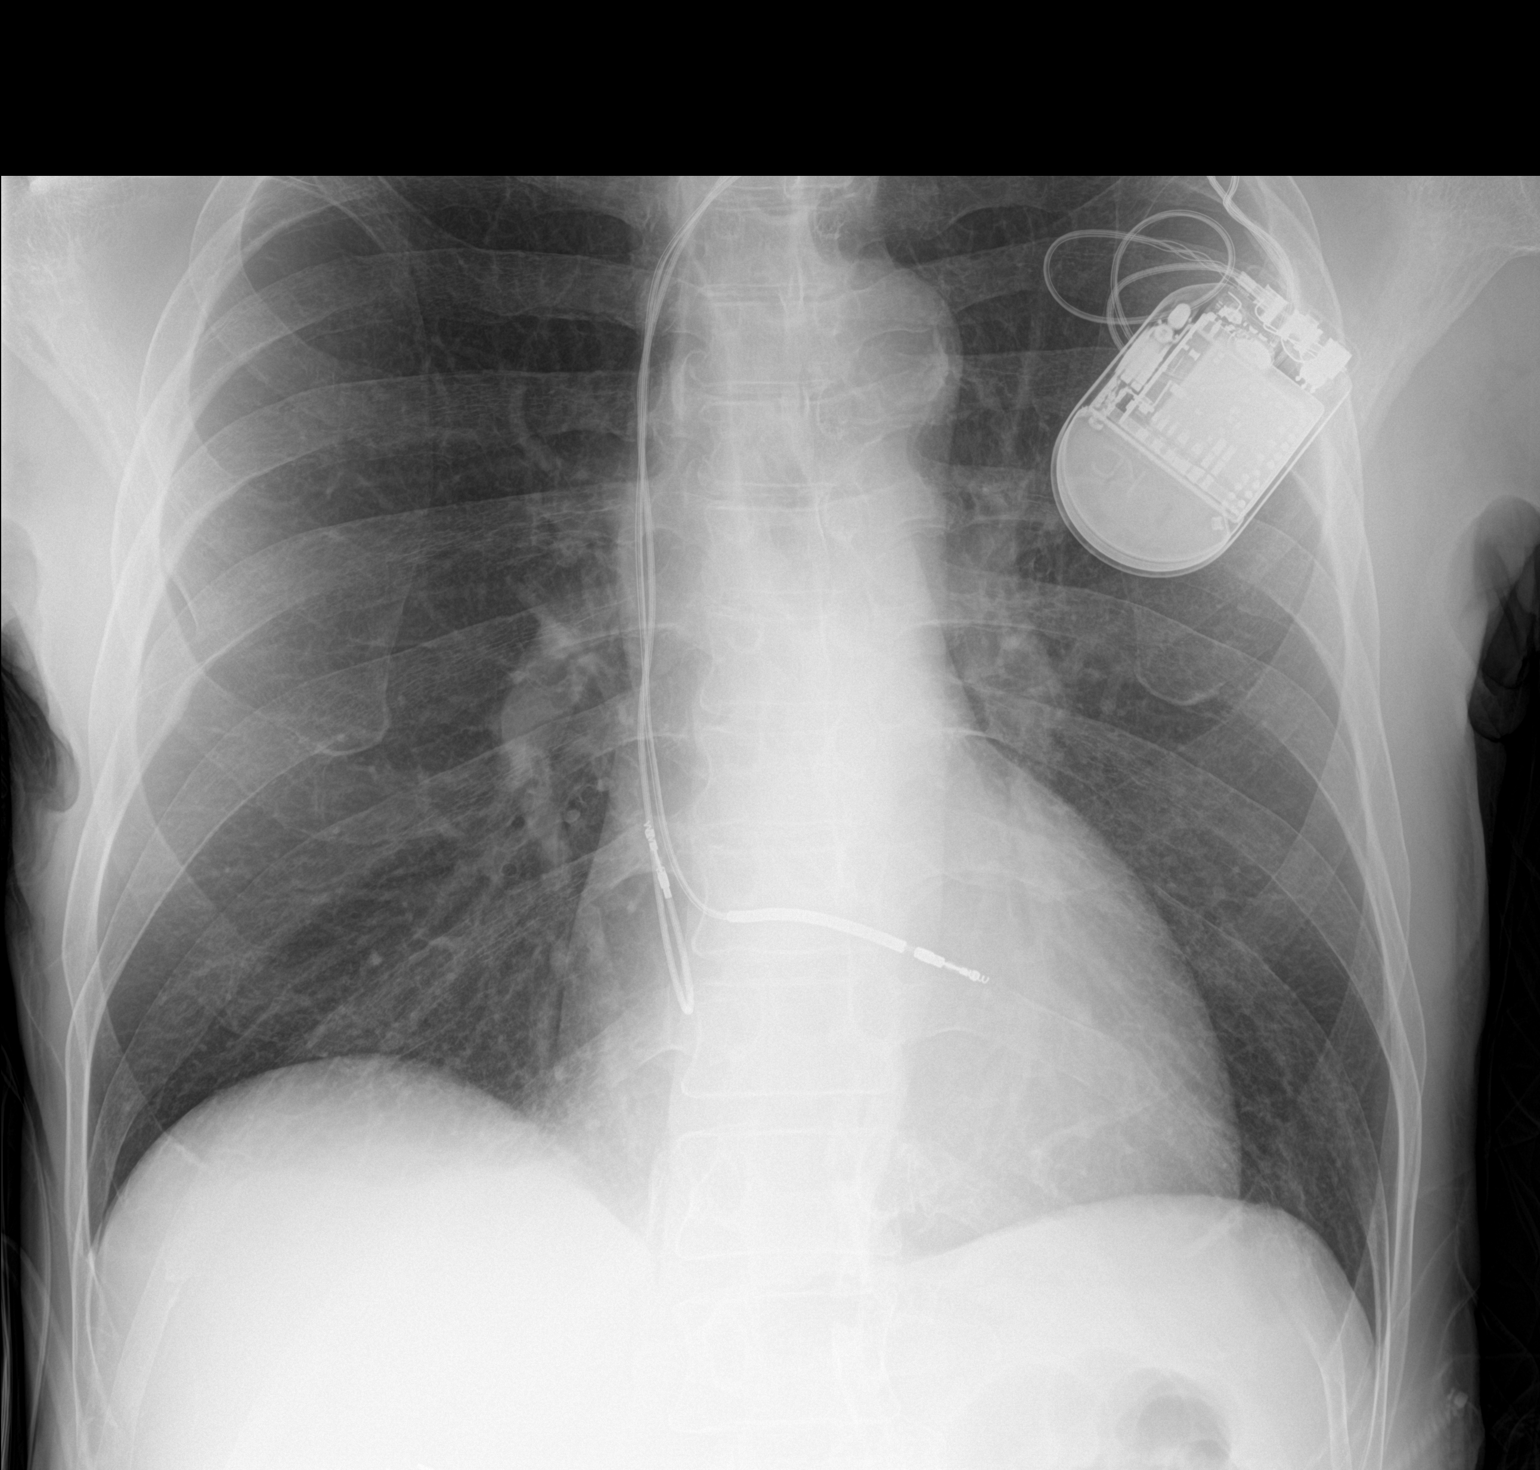

[2 of 2 positions shown; findings below may reference images not displayed]

FINDINGS: Stable multi lead pacer apparatus overlying the left hemithorax.
Stable cardiac and mediastinal contours. No large area pulmonary
consolidation. No pleural effusion or pneumothorax. Small right
middle lobe spiculated nodule not well demonstrated on current exam.
IMPRESSION: No acute cardiopulmonary process.

## 2020-07-07 IMAGING — CT CT HEAD W/O CM
3 series · 14 of 43 positions shown, 16 images · non-contrast
Comparison: [DATE]

CLINICAL DATA: Slurred speech and dizziness.  History of CVA.

EXAM:
CT HEAD WITHOUT CONTRAST
TECHNIQUE: Contiguous axial images were obtained from the base of the skull
through the vertex without intravenous contrast.

[Series 2: head wo · axial · 0.48mm/px · z∈[+1770,+1875]mm · 8 of 26 slices shown, 10 images]
[im 3/26  brain]
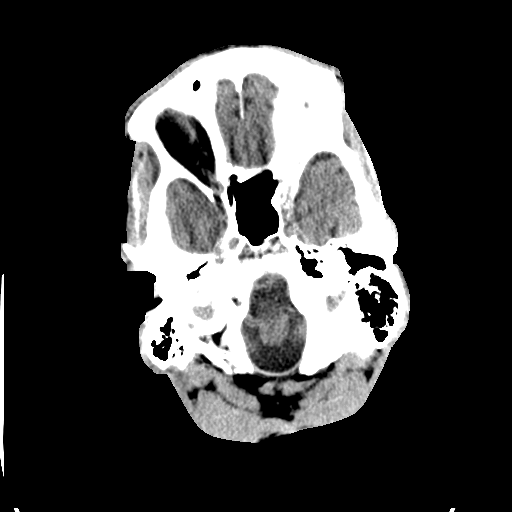
[im 3/26  bone]
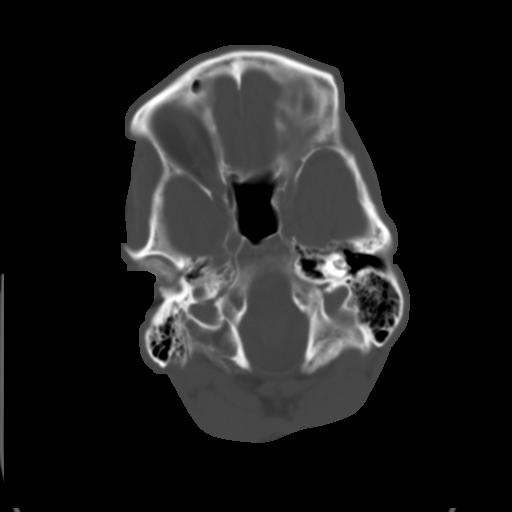
[im 6/26  brain]
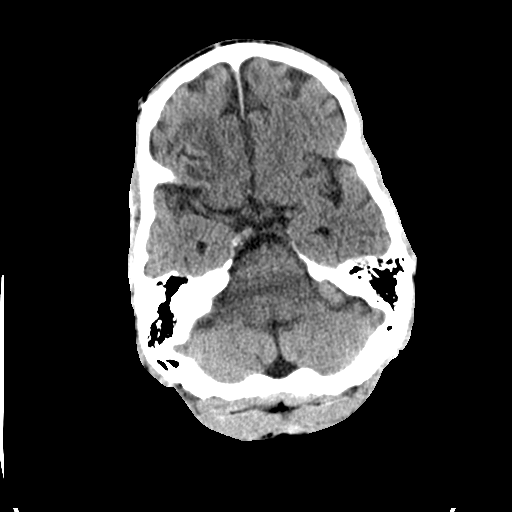
[im 9/26  brain]
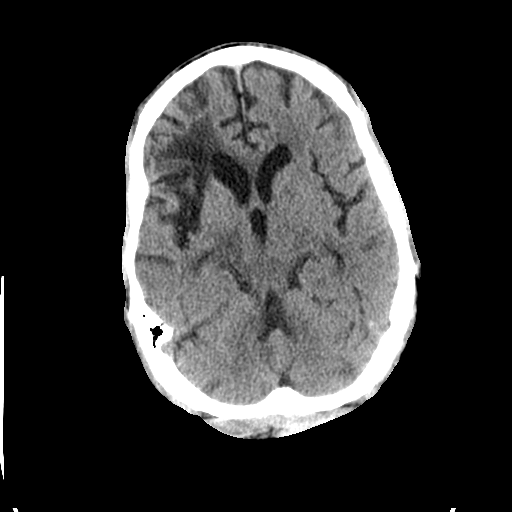
[im 12/26  brain]
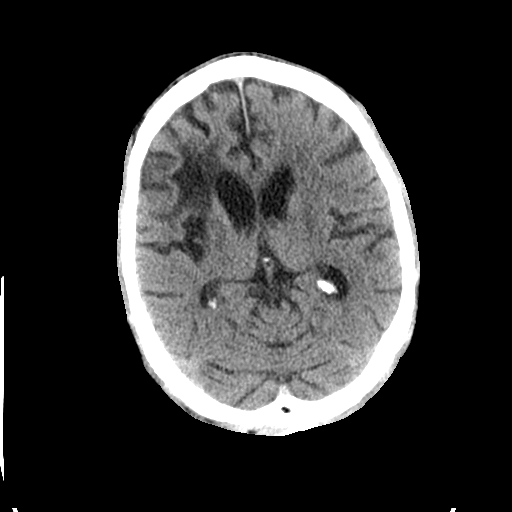
[im 15/26  brain]
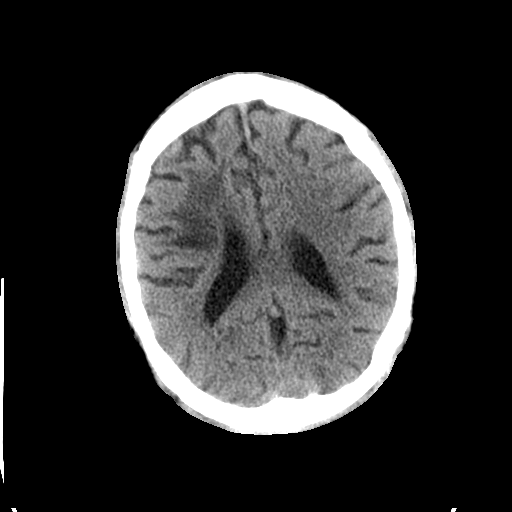
[im 15/26  bone]
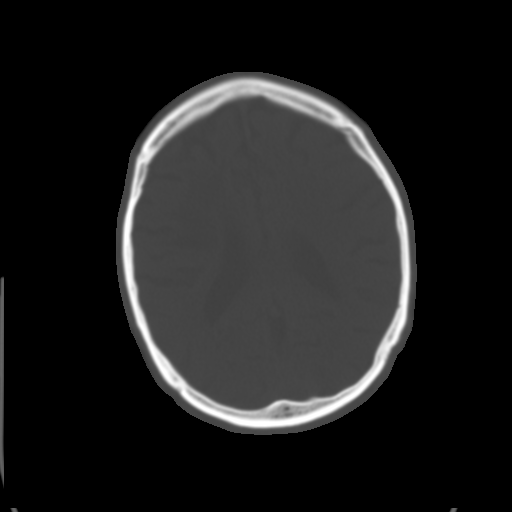
[im 18/26  brain]
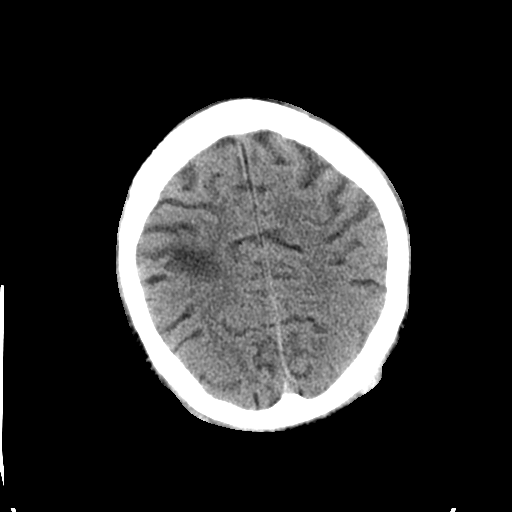
[im 21/26  brain]
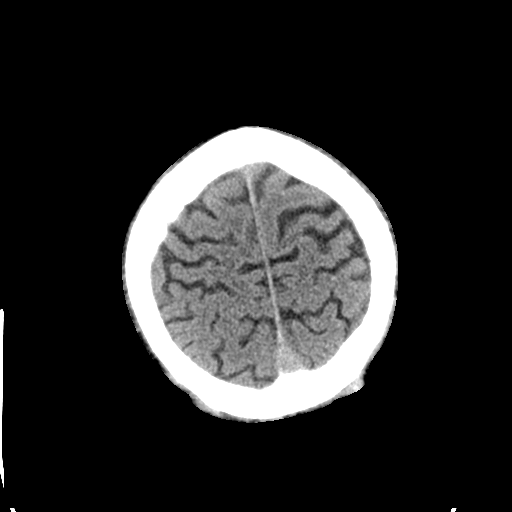
[im 24/26  brain]
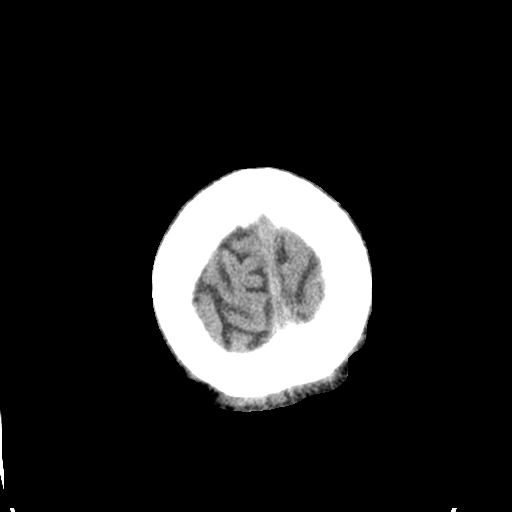

[Series 5: coronal soft tissue · coronal · 0.27mm/px · 3 of 69 slices shown]
[im 23/69  brain]
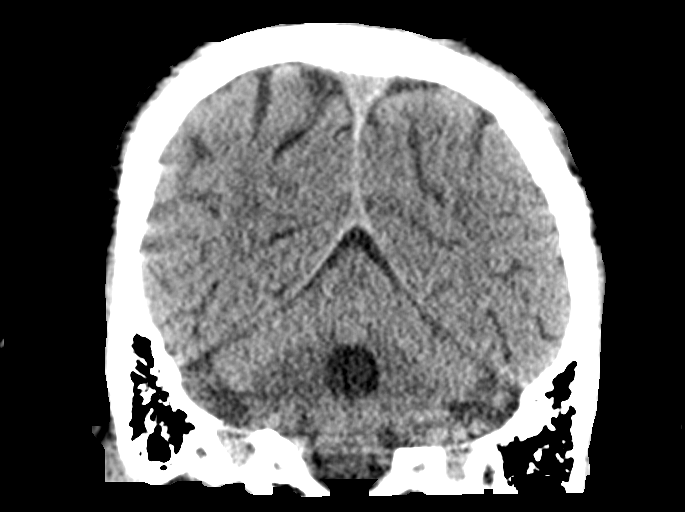
[im 31/69  brain]
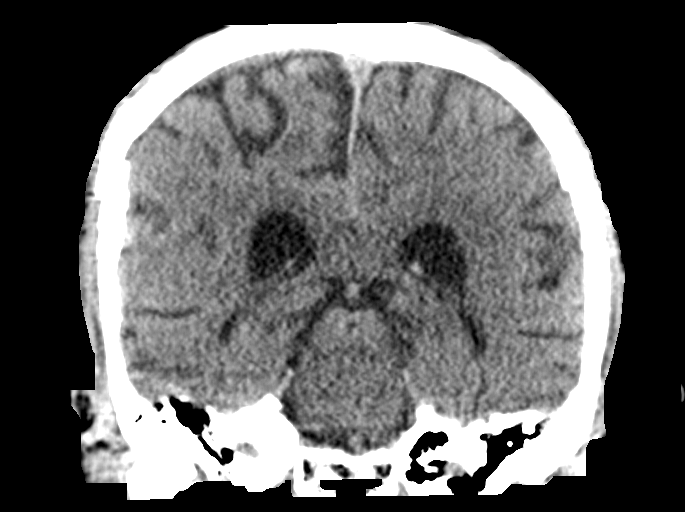
[im 38/69  brain]
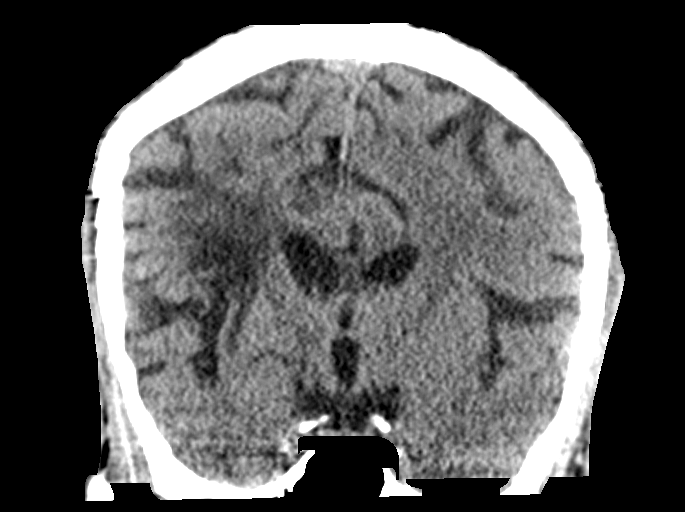

[Series 6: sagittal soft tissue · sagittal · 0.27mm/px · 3 of 61 slices shown]
[im 21/61  brain]
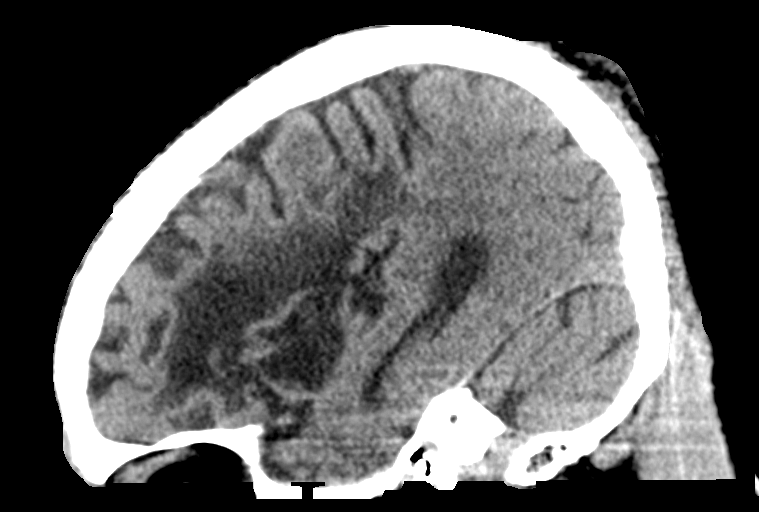
[im 31/61  brain]
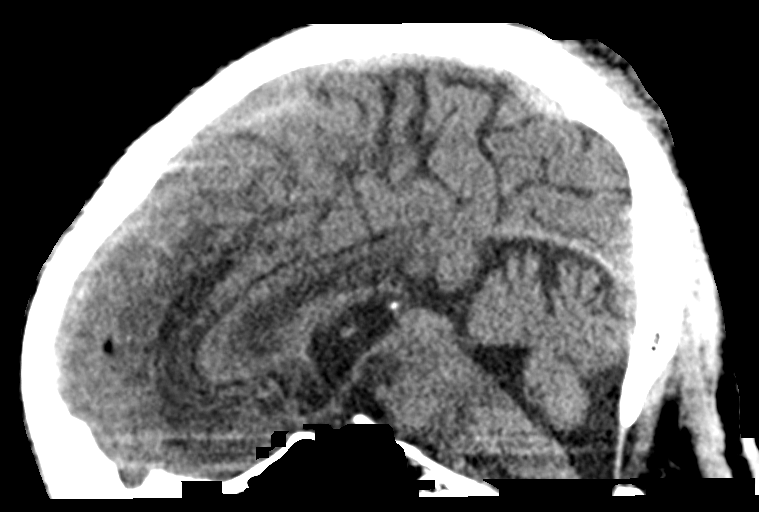
[im 41/61  brain]
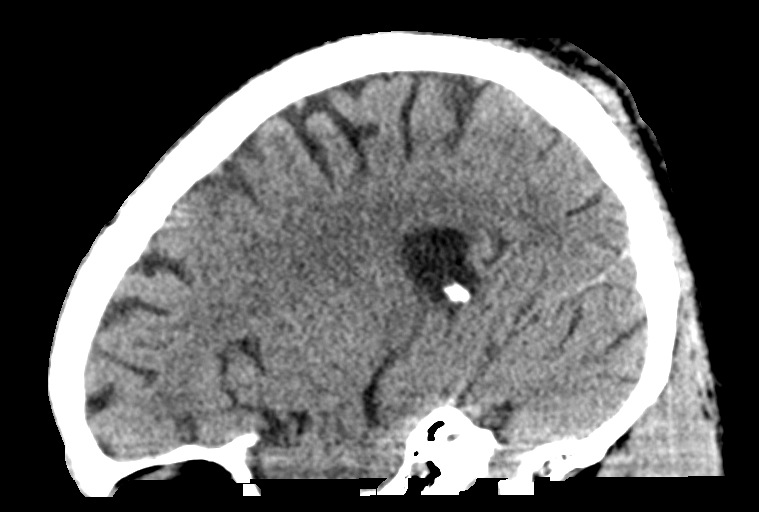

[14 of 43 positions shown; findings below may reference images not displayed]

FINDINGS: Brain: No evidence of acute infarction, hemorrhage, hydrocephalus,
extra-axial collection or mass lesion/mass effect. Stable large area
of hypoattenuation in the subcortical right frontal lobe and insula,
with associated mild ex vacuo dilatation of the right lateral
ventricle, sequela of prior right MCA infarct.

Vascular: No hyperdense vessel or unexpected calcification.

Skull: Normal. Negative for fracture or focal lesion.

Sinuses/Orbits: No acute finding.

Other: None.
IMPRESSION: 1. No acute intracranial abnormality.
2. Stable large area of hypoattenuation in the subcortical right
frontal lobe and insula, with associated mild ex vacuo dilatation of
the right lateral ventricle, sequela of prior right MCA infarct.

## 2020-07-07 IMAGING — CT CT HEAD W/ CM
3 series · 15 of 46 positions shown, 18 images · IV contrast (omnipaque)
Comparison: [DATE] head CT.  [DATE] head CT and prior.

CLINICAL DATA: Neuro deficit

EXAM:
CT HEAD WITH CONTRAST
TECHNIQUE: Contiguous axial images were obtained from the base of the skull
through the vertex with intravenous contrast.
CONTRAST:  100mL OMNIPAQUE IOHEXOL 300 MG/ML  SOLN

[Series 2: head w · axial · 0.47mm/px · z∈[+1750,+1865]mm · 9 of 29 slices shown, 12 images]
[im 3/29  brain]
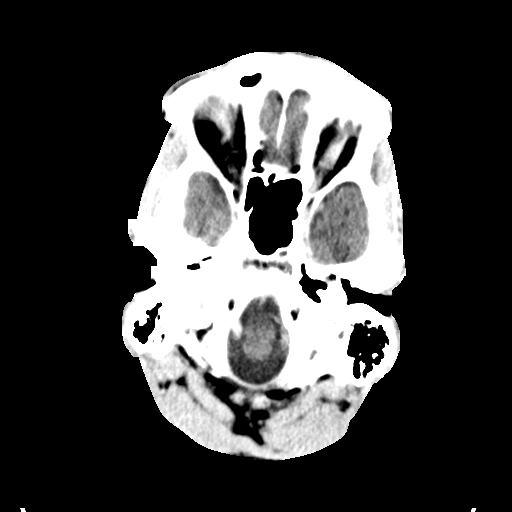
[im 3/29  bone]
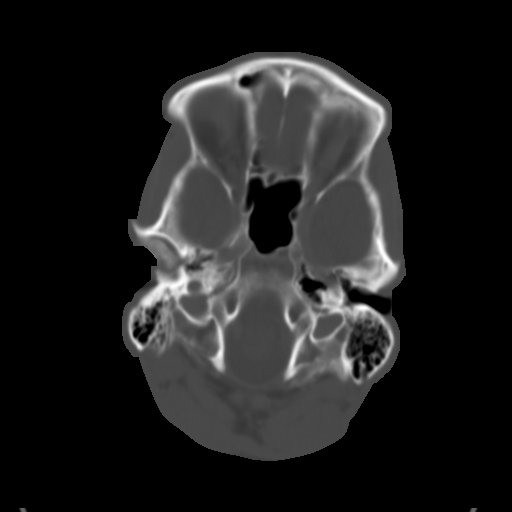
[im 6/29  brain]
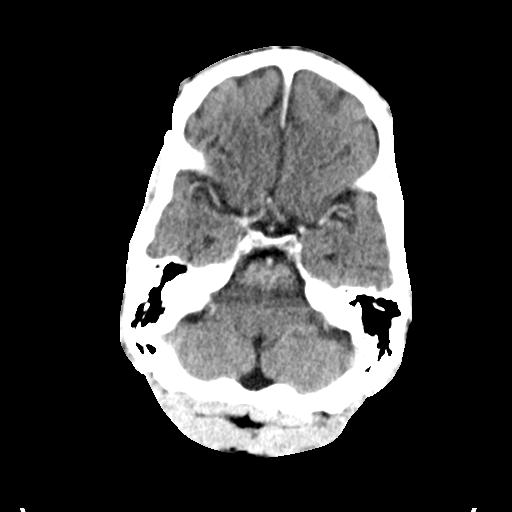
[im 9/29  brain]
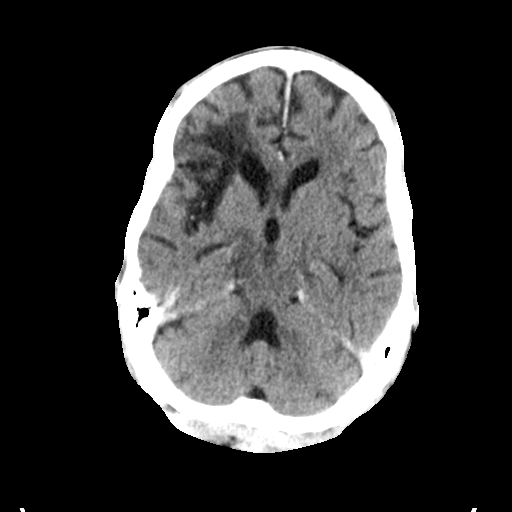
[im 12/29  brain]
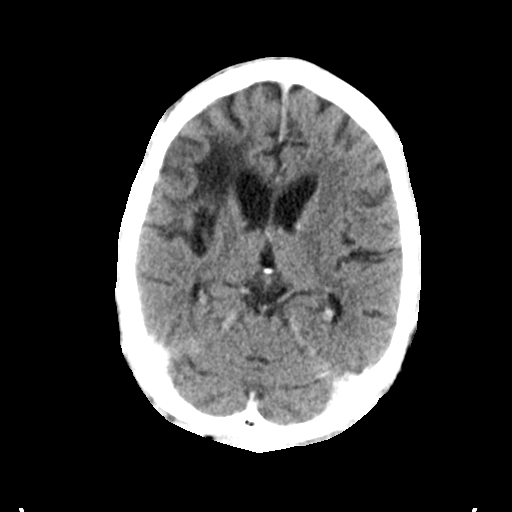
[im 15/29  brain]
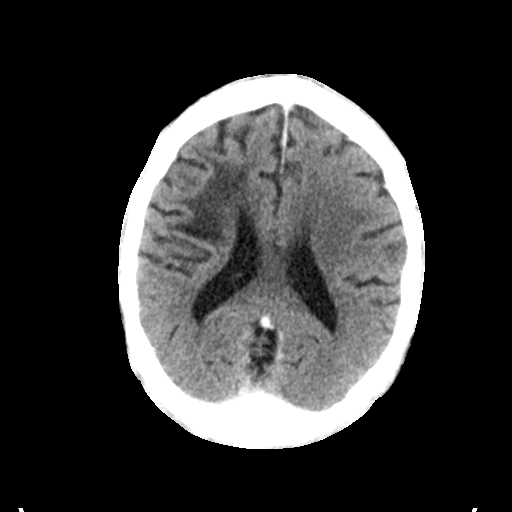
[im 15/29  bone]
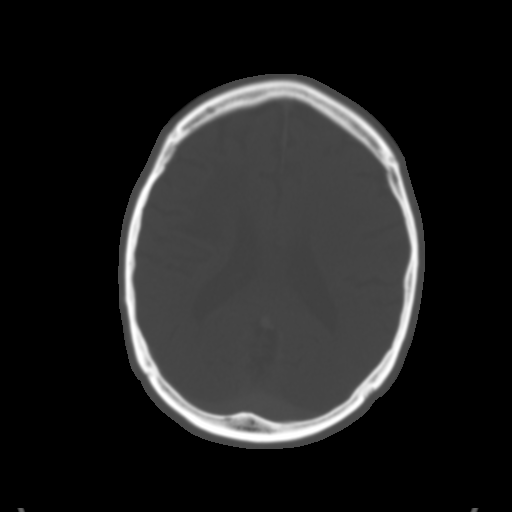
[im 17/29  brain]
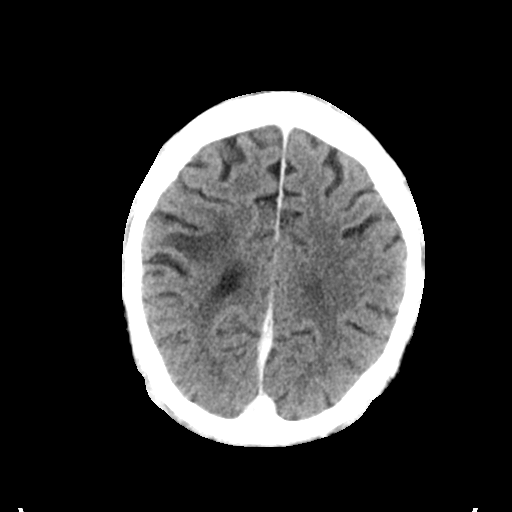
[im 20/29  brain]
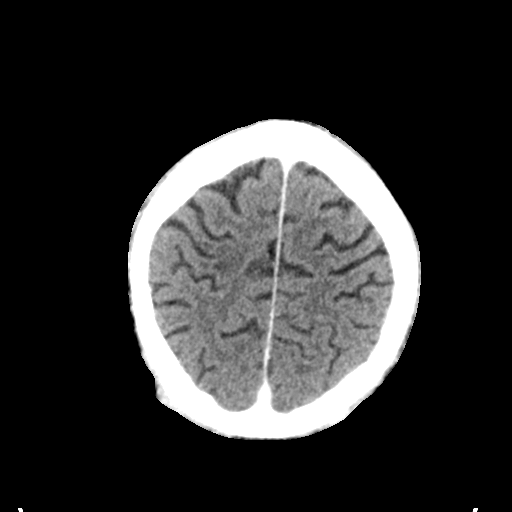
[im 23/29  brain]
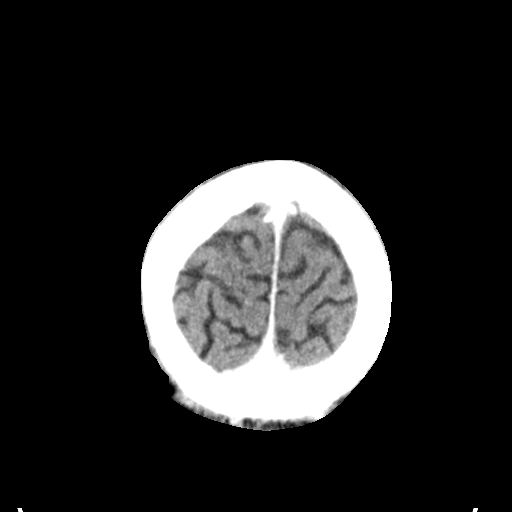
[im 26/29  brain]
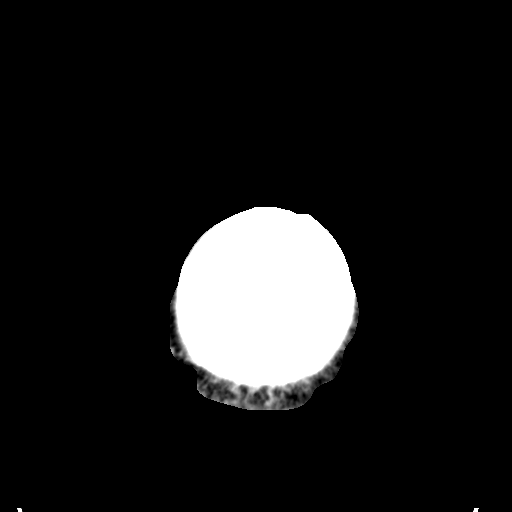
[im 26/29  bone]
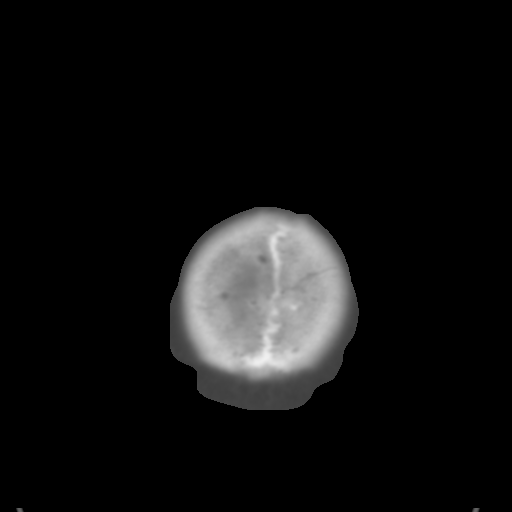

[Series 4: coronal soft tissue · coronal · 0.29mm/px · 3 of 68 slices shown]
[im 23/68  brain]
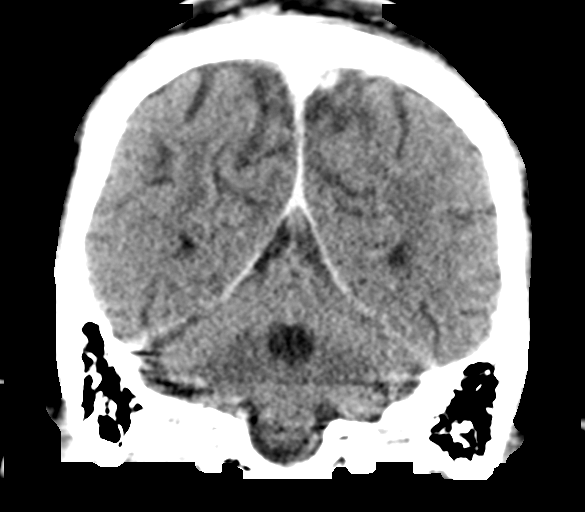
[im 30/68  brain]
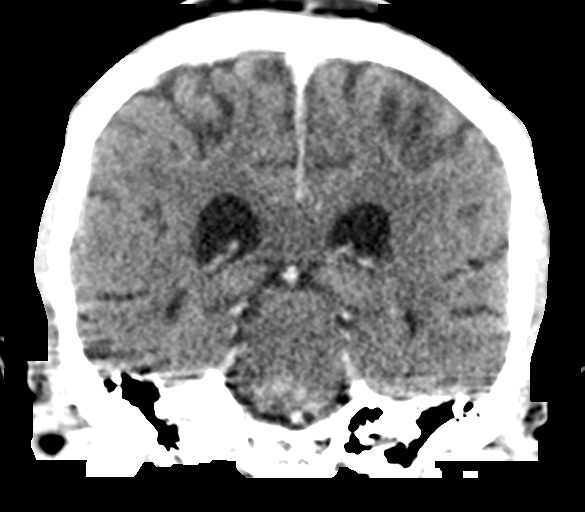
[im 38/68  brain]
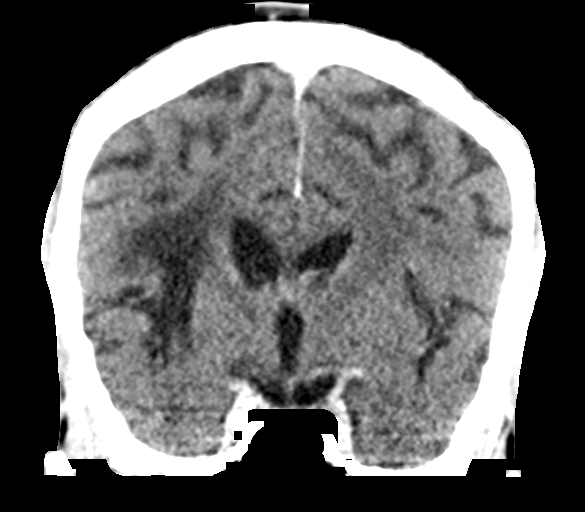

[Series 5: sagittal soft tissue · sagittal · 0.29mm/px · 3 of 54 slices shown]
[im 18/54  brain]
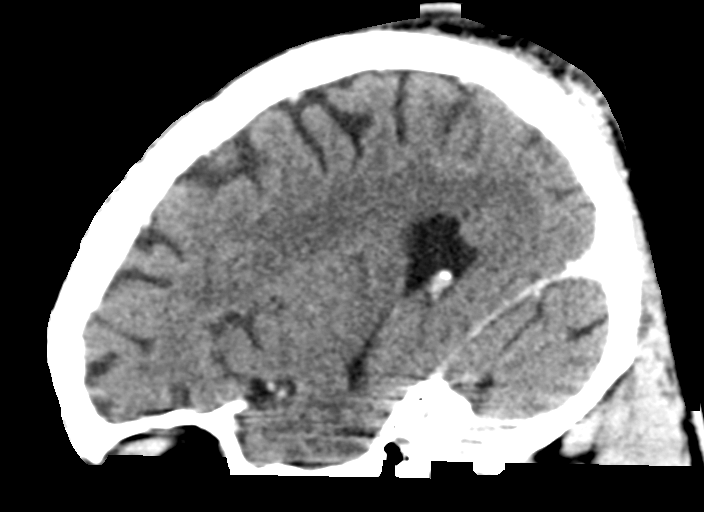
[im 27/54  brain]
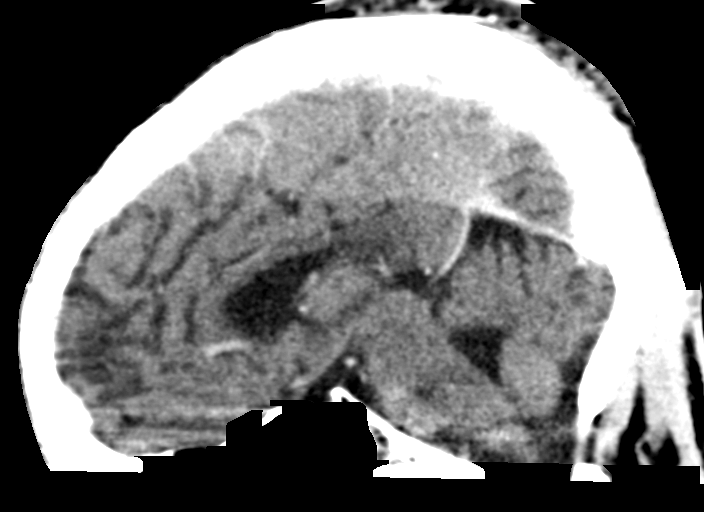
[im 36/54  brain]
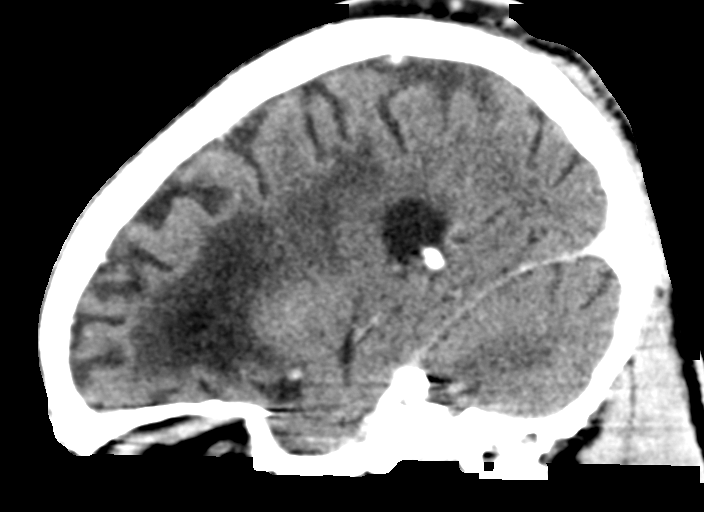

[15 of 46 positions shown; findings below may reference images not displayed]

FINDINGS: Brain: Sequela of prior right frontal insult. No acute infarct. No
intracranial hemorrhage. No abnormal enhancement or mass lesion.

Mild cerebral atrophy with ex vacuo dilatation. No midline shift,
ventriculomegaly or extra-axial fluid collection.

Vascular: Normal intravascular enhancement.

Skull: No acute finding.

Sinuses/Orbits: No acute finding. Mild ethmoid sinus mucosal
thickening.

Other: None.
IMPRESSION: No acute intracranial process.  No abnormal enhancement.

Sequela of prior right frontal insult, unchanged.

## 2020-07-07 MED ORDER — IOHEXOL 300 MG/ML  SOLN
100.0000 mL | Freq: Once | INTRAMUSCULAR | Status: AC | PRN
  Administered 2020-07-07: 100 mL via INTRAVENOUS

## 2020-07-07 NOTE — ED Provider Notes (Addendum)
[facility] DEPT Provider Note   CSN: [id number] Arrival date & time: 07/07/20  1231     History Chief Complaint  Patient presents with  . Aphasia    Ihor Criag Wicklund. is a 59 y.o. male.  Patient with hx lung cancer, presents via EMS indicating this AM onset of slurred speech. Symptoms acute onset, mild, constant, persistent. States last felt speech was normal last evening. Also notes feeling slightly unsteady on feet although reports still being able to ambulate without fall, and left arm numbness/tingling - also last felt normal from that standpoint some time last evening - pt cannot pinpoint specific time. Denies trauma or fall. No headache. No change in vision. With lung ca, hx brain mets, and indicates is currently on infusion therapy. Also is on coumadin, denies abnormal bruising or bleeding.   The history is provided by the patient and the EMS personnel.       Past Medical History:  Diagnosis Date  . Alcohol abuse with alcohol-induced mood disorder ([place]) 08/18/2015  . Automatic implantable cardioverter-defibrillator in situ 2012   S/P St Jude Dual Chamber. ICD.  Marland Kitchen CAD (coronary artery disease) 11/16/2014   CAD Coronary angiography (12/2008) with mid LAD totally occluded and collateralized.  . Cardiac LV ejection fraction 10-20%   . Chest pain 09/04/2018  . CHF (congestive heart failure) ([place])   . Chronic systolic heart failure (HCC)    a) Mixed ICM/NICM b) RHC (05/2014): RA 2, RV 19/2/3, PA 22/14 (18), PCWP 6, Fick CO/CI: 5.2 / 2.7, PVR 2.3 WU, PA 60% and 64% c) ECHO (05/2014): EF 20-25%, diff HK, akinesis entireanteroseptal myocardium, triv AI, mod MR, LA mod/sev dilated  . Cocaine abuse ([place]) 01/24/2015  . Cocaine abuse with cocaine-induced mood disorder ([place]) 08/20/2015  . Drug abuse and dependence ([place])   . Dysphagia following cerebral infarction 01/24/2015  . Embolic stroke involving right middle cerebral artery ([place])   . Extensive stage  primary small cell carcinoma of lung ([place]) 01/04/2020  . H/O noncompliance with medical treatment, presenting hazards to health 01/24/2015  . Heart murmur   . High cholesterol   . Ischemic cardiomyopathy    a) Coronary angiography (12/2008) at [facility]: [place]: nl, LAD mid 100% stenosis with left to left and right-to-left collaterals to the distal LAD; Lcx: nl, RCA nl.    . 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[place])   . MDD (major depressive disorder), recurrent severe, without psychosis ([place]) 08/18/2015  . Open wound of foot 02/27/2015  . Pneumonia 1988  . Psychoactive substance-induced mood disorder ([place]) 07/17/2015  . Sleep apnea    "cleared after T&A"  . Status post PICC central line placement   . Stroke ([place])   . Substance induced mood disorder ([place]) 08/17/2015  . Type II diabetes mellitus [facility])     Patient Active Problem List   Diagnosis Date Noted  . Small cell lung cancer, right upper lobe ([place]) 01/19/2020  . Encounter for antineoplastic chemotherapy 01/19/2020  . Encounter for antineoplastic immunotherapy 01/19/2020  . Extensive stage primary small cell carcinoma of lung ([place]) 01/04/2020  . Goals of care, counseling/discussion 01/04/2020  . Mediastinal adenopathy   . Mass of upper lobe of right lung   . Brain mass 12/23/2019  . Vasogenic cerebral edema ([place]) 12/23/2019  . Left leg paresthesias   . History of ischemic right MCA stroke   . Left-sided weakness   . 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[place])   . Tobacco abuse 01/24/2015  . Essential hypertension 01/24/2015  .  Left hemiplegia ([place]) 01/24/2015  . Hyperlipidemia associated with type 2 diabetes mellitus ([place]) 11/30/2014  . Chronic systolic heart failure ([place]) 06/20/2014  . Automatic implantable cardioverter-defibrillator in situ   . Long term (current) use of anticoagulants 08/09/2013  . Drug abuse and dependence ([place])   . Cardiomyopathy, ischemic 07/21/2011  . Type II diabetes mellitus with ophthalmic manifestations ([place])  09/13/2007    Past Surgical History:  Procedure Laterality Date  . BRONCHIAL BRUSHINGS  12/28/2019   Procedure: BRONCHIAL BRUSHINGS;  Surgeon: Garner Nash, DO;  Location: [facility];  Service: Thoracic;;  . BRONCHIAL NEEDLE ASPIRATION BIOPSY  12/28/2019   Procedure: BRONCHIAL NEEDLE ASPIRATION BIOPSIES;  Surgeon: Garner Nash, DO;  Location: [facility];  Service: Thoracic;;  . BRONCHIAL WASHINGS  12/28/2019   Procedure: BRONCHIAL WASHINGS;  Surgeon: Garner Nash, DO;  Location: [facility];  Service: Thoracic;;  . CARDIAC CATHETERIZATION  05/2014  . CARDIAC DEFIBRILLATOR PLACEMENT  03/2011  . CHOLECYSTECTOMY  1994  . FOREARM FRACTURE SURGERY Left 1980  . FRACTURE SURGERY    . RIGHT HEART CATHETERIZATION N/A 05/30/2014   Procedure: RIGHT HEART CATH;  Surgeon: Jolaine Artist, MD;  Location[facility] CATH LAB;  Service: Cardiovascular;  Laterality: N/A;  . RIGHT HEART CATHETERIZATION N/A 02/07/2015   Procedure: RIGHT HEART CATH;  Surgeon: Jolaine Artist, MD;  Location: [facility] CATH LAB;  Service: Cardiovascular;  Laterality: N/A;  . TONSILLECTOMY AND ADENOIDECTOMY  ~ 1998  . VIDEO BRONCHOSCOPY WITH ENDOBRONCHIAL ULTRASOUND N/A 12/28/2019   Procedure: VIDEO BRONCHOSCOPY WITH ENDOBRONCHIAL ULTRASOUND;  Surgeon: Garner Nash, DO;  Location: [facility];  Service: Thoracic;  Laterality: N/A;       Family History  Problem Relation Age of Onset  . Diabetes Mother   . Heart disease Mother   . Diabetes Father   . Prostate cancer Father   . Heart disease Father   . Diabetes Brother   . Diabetes Brother     Social History   Tobacco Use  . Smoking status: Current Some Day Smoker    Packs/day: 0.25    Years: 31.00    Pack years: 7.75    Types: Cigarettes  . Smokeless tobacco: Never Used  Vaping Use  . Vaping Use: Never used  Substance Use Topics  . Alcohol use: Yes    Alcohol/week: 0.0 standard drinks    Comment: 09/21/2014 "might have a drink q 6 /months, if that"    . Drug use: Not Currently    Types: Cocaine    Comment: clean since 2016    Home Medications Prior to Admission medications   Medication Sig Start Date End Date Taking? Authorizing Provider  Accu-Chek Softclix Lancets lancets  02/03/20   [provider]  atorvastatin (LIPITOR) 20 MG tablet TAKE 1 TABLET BY MOUTH DAILY AT 6 PM. Patient taking differently: Take 20 mg by mouth daily at 6 PM.  03/11/17   Bensimhon, Shaune Pascal, MD  Blood Glucose Monitoring Suppl (FIFTY50 GLUCOSE METER 2.0) w/Device KIT Use as instructed 02/03/20   [provider]  carvedilol (COREG) 6.25 MG tablet Take 1.5 tablets (9.375 mg total) by mouth 2 (two) times daily with a meal. 08/05/19   Bensimhon, Shaune Pascal, MD  Insulin Glargine (LANTUS SOLOSTAR) 100 UNIT/ML Solostar Pen Inject 36 Units into the skin daily at 10 pm.     [provider]  INSUPEN ULTRAFIN 31G X 8 MM [place]  03/02/20   [provider]  metFORMIN (GLUCOPHAGE-XR) 500 MG 24  hr tablet Take 1,000 mg by mouth daily with breakfast. Per [facility] pharmacist    Andree Moro, DO  ondansetron (ZOFRAN) 8 MG tablet Take 1 tablet (8 mg total) by mouth every 8 (eight) hours as needed for nausea or vomiting. Patient not taking: Reported on 06/20/2020 02/21/20   Curt Bears, MD  prochlorperazine (COMPAZINE) 10 MG tablet Take 1 tablet (10 mg total) by mouth every 6 (six) hours as needed. Patient not taking: Reported on 06/20/2020 01/19/20   Heilingoetter, Cassandra L, PA-C  sacubitril-valsartan (ENTRESTO) 24-26 MG Take 1 tablet by mouth 2 (two) times daily. 01/24/20   Bensimhon, Shaune Pascal, MD  spironolactone (ALDACTONE) 25 MG tablet Take 1 tablet (25 mg total) by mouth daily. 01/02/20   Bensimhon, Shaune Pascal, MD  warfarin (COUMADIN) 7.5 MG tablet TAKE 1 TABLET BY MOUTH DAILY AT 6PM. Patient taking differently: Take 7.5 mg by mouth daily. TAKE 1 TABLET BY MOUTH DAILY AT 6PM. 01/20/20   Bensimhon, Shaune Pascal, MD    Allergies    Patient has no known  allergies.  Review of Systems   Review of Systems  Constitutional: Negative for fever.  HENT: Negative for sore throat.   Eyes: Negative for redness and visual disturbance.  Respiratory: Negative for shortness of breath.   Cardiovascular: Negative for chest pain.  Gastrointestinal: Negative for abdominal pain, diarrhea and vomiting.  Endocrine: Negative for polyuria.  Genitourinary: Negative for dysuria and flank pain.  Musculoskeletal: Negative for back pain and neck pain.  Skin: Negative for rash.  Neurological: Positive for speech difficulty and numbness. Negative for headaches.  Hematological: Does not bruise/bleed easily.  Psychiatric/Behavioral: Negative for confusion.    Physical Exam Updated Vital Signs BP 108/73   Pulse 86   Temp 97.8 F (36.6 C) (Oral)   Resp 18   SpO2 97%   Physical Exam Vitals and nursing note reviewed.  Constitutional:      Appearance: Normal appearance. He is well-developed.  HENT:     Head: Atraumatic.     Nose: Nose normal.     Mouth/Throat:     Mouth: Mucous membranes are moist.     Pharynx: Oropharynx is clear.  Eyes:     General: No scleral icterus.    Extraocular Movements: Extraocular movements intact.     Conjunctiva/sclera: Conjunctivae normal.     Pupils: Pupils are equal, round, and reactive to light.  Neck:     Trachea: No tracheal deviation.  Cardiovascular:     Rate and Rhythm: Normal rate and regular rhythm.     Pulses: Normal pulses.     Heart sounds: Normal heart sounds. No murmur heard.  No friction rub. No gallop.   Pulmonary:     Effort: Pulmonary effort is normal. No accessory muscle usage or respiratory distress.     Breath sounds: Normal breath sounds.  Abdominal:     General: Bowel sounds are normal. There is no distension.     Palpations: Abdomen is soft.     Tenderness: There is no abdominal tenderness. There is no guarding.  Genitourinary:    Comments: No cva tenderness. Musculoskeletal:         General: No swelling or tenderness.     Cervical back: Normal range of motion and neck supple. No rigidity.     Right lower leg: No edema.     Left lower leg: No edema.  Skin:    General: Skin is warm and dry.     Findings: No rash.  Neurological:  Mental Status: He is alert.     Cranial Nerves: No cranial nerve deficit.     Comments: Alert, speech is mildly dysarthric. No expressive aphasia. Motor intact bil, stre 5/5. No pronator drift. Sens grossly intact bil.   Psychiatric:        Mood and Affect: Mood normal.     ED Results / Procedures / Treatments   Labs (all labs ordered are listed, but only abnormal results are displayed) Results for orders placed or performed during the hospital encounter of 07/07/20  Comprehensive metabolic panel  Result Value Ref Range   Sodium 136 135 - 145 mmol/L   Potassium 4.6 3.5 - 5.1 mmol/L   Chloride 95 (L) 98 - 111 mmol/L   CO2 28 22 - 32 mmol/L   Glucose, Bld 188 (H) 70 - 99 mg/dL   BUN 23 (H) 6 - 20 mg/dL   Creatinine, Ser 1.33 (H) 0.61 - 1.24 mg/dL   Calcium 9.7 8.9 - 10.3 mg/dL   Total Protein 7.4 6.5 - 8.1 g/dL   Albumin 3.2 (L) 3.5 - 5.0 g/dL   AST 20 15 - 41 U/L   ALT 17 0 - 44 U/L   Alkaline Phosphatase 88 38 - 126 U/L   Total Bilirubin 0.8 0.3 - 1.2 mg/dL   GFR calc non Af Amer 59 (L) >60 mL/min   GFR calc Af Amer >60 >60 mL/min   Anion gap 13 5 - 15  CBC  Result Value Ref Range   WBC 7.4 4.0 - 10.5 K/uL   RBC 3.40 (L) 4.22 - 5.81 MIL/uL   Hemoglobin 9.8 (L) 13.0 - 17.0 g/dL   HCT 30.4 (L) 39 - 52 %   MCV 89.4 80.0 - 100.0 fL   MCH 28.8 26.0 - 34.0 pg   MCHC 32.2 30.0 - 36.0 g/dL   RDW 12.7 11.5 - 15.5 %   Platelets 319 150 - 400 K/uL   nRBC 0.0 0.0 - 0.2 %  Protime-INR  Result Value Ref Range   Prothrombin Time 13.7 11.4 - 15.2 seconds   INR 1.1 0.8 - 1.2  Urinalysis, Routine w reflex microscopic Urine, Clean Catch  Result Value Ref Range   Color, Urine YELLOW YELLOW   APPearance CLEAR CLEAR   Specific  Gravity, Urine 1.014 1.005 - 1.030   pH 5.0 5.0 - 8.0   Glucose, UA 50 (A) NEGATIVE mg/dL   Hgb urine dipstick NEGATIVE NEGATIVE   Bilirubin Urine NEGATIVE NEGATIVE   Ketones, ur NEGATIVE NEGATIVE mg/dL   Protein, ur 100 (A) NEGATIVE mg/dL   Nitrite NEGATIVE NEGATIVE   Leukocytes,Ua NEGATIVE NEGATIVE   RBC / HPF 0-5 0 - 5 RBC/hpf   WBC, UA 0-5 0 - 5 WBC/hpf   Bacteria, UA RARE (A) NONE SEEN   Mucus PRESENT    Hyaline Casts, UA PRESENT   Rapid urine drug screen (hospital performed)  Result Value Ref Range   Opiates NONE DETECTED NONE DETECTED   Cocaine POSITIVE (A) NONE DETECTED   Benzodiazepines NONE DETECTED NONE DETECTED   Amphetamines NONE DETECTED NONE DETECTED   Tetrahydrocannabinol NONE DETECTED NONE DETECTED   Barbiturates NONE DETECTED NONE DETECTED   CT HEAD WO CONTRAST  Result Date: 07/07/2020 CLINICAL DATA:  Slurred speech and dizziness.  History of CVA. EXAM: CT HEAD WITHOUT CONTRAST TECHNIQUE: Contiguous axial images were obtained from the base of the skull through the vertex without intravenous contrast. COMPARISON:  June 07, 2020 FINDINGS: Brain: No evidence of acute infarction,  hemorrhage, hydrocephalus, extra-axial collection or mass lesion/mass effect. Stable large area of hypoattenuation in the subcortical right frontal lobe and insula, with associated mild ex vacuo dilatation of the right lateral ventricle, sequela of prior right MCA infarct. Vascular: No hyperdense vessel or unexpected calcification. Skull: Normal. Negative for fracture or focal lesion. Sinuses/Orbits: No acute finding. Other: None. IMPRESSION: 1. No acute intracranial abnormality. 2. Stable large area of hypoattenuation in the subcortical right frontal lobe and insula, with associated mild ex vacuo dilatation of the right lateral ventricle, sequela of prior right MCA infarct. Electronically Signed   By: Fidela Salisbury M.D.   On: 07/07/2020 15:17   DG Chest Port 1 View  Result Date:  07/07/2020 CLINICAL DATA:  Chills and weakness.  History of lung cancer. EXAM: PORTABLE CHEST 1 VIEW COMPARISON:  Chest radiograph 10/07/2019. FINDINGS: Stable multi lead pacer apparatus overlying the left hemithorax. Stable cardiac and mediastinal contours. No large area pulmonary consolidation. No pleural effusion or pneumothorax. Small right middle lobe spiculated nodule not well demonstrated on current exam. IMPRESSION: No acute cardiopulmonary process. Electronically Signed   By: Lovey Newcomer M.D.   On: 07/07/2020 13:54    EKG EKG Interpretation  Date/Time:  Saturday July 07 2020 13:47:05 EDT Ventricular Rate:  87 PR Interval:    QRS Duration: 101 QT Interval:  436 QTC Calculation: 525 R Axis:   5 Text Interpretation: Sinus rhythm Left ventricular hypertrophy Prolonged QT interval Non-specific ST-t changes Confirmed by Lajean Saver [phone]) on 07/07/2020 2:17:48 PM   Radiology CT HEAD WO CONTRAST  Result Date: 07/07/2020 CLINICAL DATA:  Slurred speech and dizziness.  History of CVA. EXAM: CT HEAD WITHOUT CONTRAST TECHNIQUE: Contiguous axial images were obtained from the base of the skull through the vertex without intravenous contrast. COMPARISON:  June 07, 2020 FINDINGS: Brain: No evidence of acute infarction, hemorrhage, hydrocephalus, extra-axial collection or mass lesion/mass effect. Stable large area of hypoattenuation in the subcortical right frontal lobe and insula, with associated mild ex vacuo dilatation of the right lateral ventricle, sequela of prior right MCA infarct. Vascular: No hyperdense vessel or unexpected calcification. Skull: Normal. Negative for fracture or focal lesion. Sinuses/Orbits: No acute finding. Other: None. IMPRESSION: 1. No acute intracranial abnormality. 2. Stable large area of hypoattenuation in the subcortical right frontal lobe and insula, with associated mild ex vacuo dilatation of the right lateral ventricle, sequela of prior right MCA infarct.  Electronically Signed   By: Fidela Salisbury M.D.   On: 07/07/2020 15:17   CT HEAD W CONTRAST  Result Date: 07/07/2020 CLINICAL DATA:  Neuro deficit EXAM: CT HEAD WITH CONTRAST TECHNIQUE: Contiguous axial images were obtained from the base of the skull through the vertex with intravenous contrast. CONTRAST:  17m OMNIPAQUE IOHEXOL 300 MG/ML  SOLN COMPARISON:  07/07/2020 head CT.  06/06/2020 head CT and prior. FINDINGS: Brain: Sequela of prior right frontal insult. No acute infarct. No intracranial hemorrhage. No abnormal enhancement or mass lesion. Mild cerebral atrophy with ex vacuo dilatation. No midline shift, ventriculomegaly or extra-axial fluid collection. Vascular: Normal intravascular enhancement. Skull: No acute finding. Sinuses/Orbits: No acute finding. Mild ethmoid sinus mucosal thickening. Other: None. IMPRESSION: No acute intracranial process.  No abnormal enhancement. Sequela of prior right frontal insult, unchanged. Electronically Signed   By: CPrimitivo GauzeM.D.   On: 07/07/2020 19:43   DG Chest Port 1 View  Result Date: 07/07/2020 CLINICAL DATA:  Chills and weakness.  History of lung cancer. EXAM: PORTABLE CHEST 1 VIEW COMPARISON:  Chest  radiograph 10/07/2019. FINDINGS: Stable multi lead pacer apparatus overlying the left hemithorax. Stable cardiac and mediastinal contours. No large area pulmonary consolidation. No pleural effusion or pneumothorax. Small right middle lobe spiculated nodule not well demonstrated on current exam. IMPRESSION: No acute cardiopulmonary process. Electronically Signed   By: Lovey Newcomer M.D.   On: 07/07/2020 13:54    Procedures Procedures (including critical care time)  Medications Ordered in ED Medications - No data to display  ED Course  I have reviewed the triage vital signs and the nursing notes.  Pertinent labs & imaging results that were available during my care of the patient were reviewed by me and considered in my medical decision  making (see chart for details).    MDM Rules/Calculators/A&P                          Iv ns. Continuous pulse ox and monitor. Stat labs and imaging.   Reviewed nursing notes and prior charts for additional history.   Labs reviewed/interpreted by me - Na/K normal. Glucose sl high.   CT reviewed/interpreted by me - no acute cva or hem.   V mild dysarthria persists. Pt has pacemaker so not able to get emergent MRI. Will consult neurology.   Discussed pt with Dr Carloyn Jaeger, he indicates to get ct w contrast, r/o met, if negative for met, consider admission to medicine for cva workup.   CT w contrast neg for met.   Pt initially indicated he had felt improved, and was ambulatory to bathroom, and that it went fine.   On additional recheck, pt indicates his speech remains not at baseline, and indicates although he did ambulate back and forth to bathroom, that his gait still seems unsteady.  Pt is not therapeutic on his coumadin. CT does not show new met, but patient may have had a stroke, w last normal yesterday.   Will consult medicine for admission.      Final Clinical Impression(s) / ED Diagnoses Final diagnoses:  None    Rx / DC Orders ED Discharge Orders    None        Lajean Saver, MD 07/07/20 2047

## 2020-07-07 NOTE — Discharge Instructions (Addendum)
It was our pleasure to provide your ER care today - we hope that you feel better.  Take your coumadin as prescribed by your doctors - contact coumadin clinic Monday AM to discuss coumadin level and dose.   Avoid any cocaine use - cocaine use can cause heart attack and stroke.   Follow up with your cancer doctors as arranged with them.   For recent symptoms/slurred speech - follow up with neurologist in the coming week - call office Monday to arrange appointment.   Return to ER right away if worse, new symptoms, new change in speech or vision, one-sided numbness and/or weakness, change in balance or coordination, or other concern.

## 2020-07-07 NOTE — ED Triage Notes (Signed)
Arrived by [facility] from home. Patient reports chills and sudden voice change. Hx lung cancer

## 2020-07-07 NOTE — ED Notes (Signed)
Assumed care of patient at this time, nad noted, sr up x2, bed locked and low, call bell w/I reach.  Will continue to monitor.  Patient ambulatory to and from the bathroom with a steady gait.

## 2020-07-08 ENCOUNTER — Other Ambulatory Visit: Payer: Self-pay

## 2020-07-08 ENCOUNTER — Encounter (HOSPITAL_COMMUNITY): Payer: Self-pay | Admitting: Internal Medicine

## 2020-07-08 DIAGNOSIS — F1410 Cocaine abuse, uncomplicated: Secondary | ICD-10-CM | POA: Diagnosis not present

## 2020-07-08 DIAGNOSIS — Z794 Long term (current) use of insulin: Secondary | ICD-10-CM

## 2020-07-08 DIAGNOSIS — R4781 Slurred speech: Secondary | ICD-10-CM | POA: Diagnosis present

## 2020-07-08 DIAGNOSIS — Z95810 Presence of automatic (implantable) cardiac defibrillator: Secondary | ICD-10-CM | POA: Diagnosis not present

## 2020-07-08 DIAGNOSIS — E1165 Type 2 diabetes mellitus with hyperglycemia: Secondary | ICD-10-CM

## 2020-07-08 DIAGNOSIS — R2681 Unsteadiness on feet: Secondary | ICD-10-CM | POA: Diagnosis not present

## 2020-07-08 DIAGNOSIS — I5022 Chronic systolic (congestive) heart failure: Secondary | ICD-10-CM | POA: Diagnosis not present

## 2020-07-08 LAB — HEMOGLOBIN A1C
Hgb A1c MFr Bld: 10.5 % — ABNORMAL HIGH (ref 4.8–5.6)
Mean Plasma Glucose: 254.65 mg/dL

## 2020-07-08 LAB — COMPREHENSIVE METABOLIC PANEL
ALT: 16 U/L (ref 0–44)
AST: 18 U/L (ref 15–41)
Albumin: 3 g/dL — ABNORMAL LOW (ref 3.5–5.0)
Alkaline Phosphatase: 83 U/L (ref 38–126)
Anion gap: 10 (ref 5–15)
BUN: 21 mg/dL — ABNORMAL HIGH (ref 6–20)
CO2: 24 mmol/L (ref 22–32)
Calcium: 8.8 mg/dL — ABNORMAL LOW (ref 8.9–10.3)
Chloride: 99 mmol/L (ref 98–111)
Creatinine, Ser: 1.15 mg/dL (ref 0.61–1.24)
GFR calc Af Amer: >60 mL/min (ref >60)
GFR calc non Af Amer: >60 mL/min (ref >60)
Glucose, Bld: 147 mg/dL — ABNORMAL HIGH (ref 70–99)
Potassium: 3.6 mmol/L (ref 3.5–5.1)
Sodium: 133 mmol/L — ABNORMAL LOW (ref 135–145)
Total Bilirubin: 0.7 mg/dL (ref 0.3–1.2)
Total Protein: 6.8 g/dL (ref 6.5–8.1)

## 2020-07-08 LAB — LIPID PANEL
Cholesterol: 143 mg/dL (ref 0–200)
HDL: 45 mg/dL (ref >40)
LDL Cholesterol: 77 mg/dL (ref 0–99)
Total CHOL/HDL Ratio: 3.2 RATIO
Triglycerides: 103 mg/dL (ref <150)
VLDL: 21 mg/dL (ref 0–40)

## 2020-07-08 LAB — VITAMIN B12: Vitamin B-12: 1421 pg/mL — ABNORMAL HIGH (ref 180–914)

## 2020-07-08 LAB — GLUCOSE, CAPILLARY
Glucose-Capillary: 141 mg/dL — ABNORMAL HIGH (ref 70–99)
Glucose-Capillary: 139 mg/dL — ABNORMAL HIGH (ref 70–99)
Glucose-Capillary: 91 mg/dL (ref 70–99)
Glucose-Capillary: 74 mg/dL (ref 70–99)
Glucose-Capillary: 196 mg/dL — ABNORMAL HIGH (ref 70–99)
Glucose-Capillary: 211 mg/dL — ABNORMAL HIGH (ref 70–99)

## 2020-07-08 LAB — PROTIME-INR
INR: 1.1 (ref 0.8–1.2)
Prothrombin Time: 14.1 seconds (ref 11.4–15.2)

## 2020-07-08 LAB — CBC
HCT: 28.1 % — ABNORMAL LOW (ref 39.0–52.0)
Hemoglobin: 8.8 g/dL — ABNORMAL LOW (ref 13.0–17.0)
MCH: 28.6 pg (ref 26.0–34.0)
MCHC: 31.3 g/dL (ref 30.0–36.0)
MCV: 91.2 fL (ref 80.0–100.0)
Platelets: 307 10*3/uL (ref 150–400)
RBC: 3.08 MIL/uL — ABNORMAL LOW (ref 4.22–5.81)
RDW: 13 % (ref 11.5–15.5)
WBC: 6.5 10*3/uL (ref 4.0–10.5)
nRBC: 0 % (ref 0.0–0.2)

## 2020-07-08 LAB — APTT: aPTT: 38 seconds — ABNORMAL HIGH (ref 24–36)

## 2020-07-08 LAB — FOLATE: Folate: 10 ng/mL (ref >5.9)

## 2020-07-08 MED ORDER — SPIRONOLACTONE 25 MG PO TABS
25.0000 mg | ORAL_TABLET | Freq: Every day | ORAL | Status: DC
  Administered 2020-07-08: 25 mg via ORAL
  Filled 2020-07-08: qty 1

## 2020-07-08 MED ORDER — WARFARIN SODIUM 7.5 MG PO TABS: 7.5000 mg | ORAL_TABLET | ORAL | Status: DC

## 2020-07-08 MED ORDER — STROKE: EARLY STAGES OF RECOVERY BOOK
Freq: Once | Status: AC
  Filled 2020-07-08: qty 1

## 2020-07-08 MED ORDER — WARFARIN - PHARMACIST DOSING INPATIENT: Freq: Every day | Status: DC

## 2020-07-08 MED ORDER — POLYETHYLENE GLYCOL 3350 17 G PO PACK: 17.0000 g | PACK | Freq: Every day | ORAL | Status: DC | PRN

## 2020-07-08 MED ORDER — ACETAMINOPHEN 325 MG PO TABS: 650.0000 mg | ORAL_TABLET | ORAL | Status: DC | PRN

## 2020-07-08 MED ORDER — WARFARIN SODIUM 2.5 MG PO TABS: 3.7500 mg | ORAL_TABLET | ORAL | Status: DC

## 2020-07-08 MED ORDER — ACETAMINOPHEN 650 MG RE SUPP: 650.0000 mg | RECTAL | Status: DC | PRN

## 2020-07-08 MED ORDER — ONDANSETRON HCL 4 MG/2ML IJ SOLN: 4.0000 mg | Freq: Four times a day (QID) | INTRAMUSCULAR | Status: DC | PRN

## 2020-07-08 MED ORDER — LACTATED RINGERS IV SOLN: INTRAVENOUS | Status: AC

## 2020-07-08 MED ORDER — ATORVASTATIN CALCIUM 10 MG PO TABS
20.0000 mg | ORAL_TABLET | Freq: Every day | ORAL | Status: DC
  Administered 2020-07-08 – 2020-07-25 (×18): 20 mg via ORAL
  Filled 2020-07-08 (×18): qty 2

## 2020-07-08 MED ORDER — ACETAMINOPHEN 160 MG/5ML PO SOLN: 650.0000 mg | ORAL | Status: DC | PRN

## 2020-07-08 MED ORDER — INSULIN ASPART 100 UNIT/ML ~~LOC~~ SOLN
0.0000 [IU] | Freq: Three times a day (TID) | SUBCUTANEOUS | Status: DC
  Administered 2020-07-08: 3 [IU] via SUBCUTANEOUS
  Administered 2020-07-08: 5 [IU] via SUBCUTANEOUS
  Administered 2020-07-08 – 2020-07-09 (×3): 2 [IU] via SUBCUTANEOUS
  Administered 2020-07-09: 5 [IU] via SUBCUTANEOUS
  Administered 2020-07-10: 2 [IU] via SUBCUTANEOUS
  Administered 2020-07-10: 3 [IU] via SUBCUTANEOUS
  Administered 2020-07-11: 15 [IU] via SUBCUTANEOUS
  Administered 2020-07-11: 8 [IU] via SUBCUTANEOUS
  Administered 2020-07-11: 3 [IU] via SUBCUTANEOUS
  Administered 2020-07-11: 8 [IU] via SUBCUTANEOUS
  Administered 2020-07-12: 11 [IU] via SUBCUTANEOUS
  Administered 2020-07-12: 2 [IU] via SUBCUTANEOUS
  Administered 2020-07-12: 3 [IU] via SUBCUTANEOUS
  Filled 2020-07-08: qty 0.15

## 2020-07-08 MED ORDER — ASPIRIN EC 81 MG PO TBEC
81.0000 mg | DELAYED_RELEASE_TABLET | Freq: Every day | ORAL | Status: DC
  Administered 2020-07-08 – 2020-07-22 (×15): 81 mg via ORAL
  Filled 2020-07-08 (×15): qty 1

## 2020-07-08 MED ORDER — SACUBITRIL-VALSARTAN 24-26 MG PO TABS
1.0000 | ORAL_TABLET | Freq: Every evening | ORAL | Status: DC
  Filled 2020-07-08: qty 1

## 2020-07-08 MED ORDER — SODIUM CHLORIDE 0.9 % IV SOLN: INTRAVENOUS | Status: DC

## 2020-07-08 MED ORDER — INSULIN GLARGINE 100 UNIT/ML ~~LOC~~ SOLN
36.0000 [IU] | Freq: Every day | SUBCUTANEOUS | Status: DC
  Administered 2020-07-08: 36 [IU] via SUBCUTANEOUS
  Filled 2020-07-08 (×2): qty 0.36

## 2020-07-08 MED ORDER — WARFARIN SODIUM 5 MG PO TABS
7.5000 mg | ORAL_TABLET | ORAL | Status: AC
  Administered 2020-07-08: 7.5 mg via ORAL
  Filled 2020-07-08: qty 1

## 2020-07-08 MED ORDER — WARFARIN SODIUM 5 MG PO TABS
7.5000 mg | ORAL_TABLET | Freq: Once | ORAL | Status: AC
  Administered 2020-07-08: 7.5 mg via ORAL
  Filled 2020-07-08: qty 1

## 2020-07-08 MED ORDER — ATORVASTATIN CALCIUM 40 MG PO TABS: 80.0000 mg | ORAL_TABLET | Freq: Every day | ORAL | Status: DC

## 2020-07-08 MED ORDER — CARVEDILOL 3.125 MG PO TABS: 6.2500 mg | ORAL_TABLET | Freq: Every evening | ORAL | Status: DC

## 2020-07-08 NOTE — Progress Notes (Signed)
Patient arrived at approximately 23. He is alert and verbally responsive with slurring noted. His gait is unsteady but has a history of a rt cva with some left side deficit. A comprehensive NIHSS assessment was completed on arrival to the unit. Noted some coughing after bedside swallowing evaluation. Will maintain NPO as ordered until further assessment by speech therapy is completed.

## 2020-07-08 NOTE — Evaluation (Signed)
Clinical/Bedside Swallow Evaluation Patient Details  Name: Dennis [name]. MRN: [id number] Date of Birth: 09-Aug-1961  Today's Date: 07/08/2020 Time: SLP Start Time (ACUTE ONLY): 1415 SLP Stop Time (ACUTE ONLY): 1430 SLP Time Calculation (min) (ACUTE ONLY): 15 min  Past Medical History:  Past Medical History:  Diagnosis Date  . Alcohol abuse with alcohol-induced mood disorder ([place]) 08/18/2015  . Automatic implantable cardioverter-defibrillator in situ 2012   S/P St Jude Dual Chamber. ICD.  Marland Kitchen CAD (coronary artery disease) 11/16/2014   CAD Coronary angiography (12/2008) with mid LAD totally occluded and collateralized.  . Cardiac LV ejection fraction 10-20%   . Chest pain 09/04/2018  . CHF (congestive heart failure) ([place])   . Chronic systolic heart failure (HCC)    a) Mixed ICM/NICM b) RHC (05/2014): RA 2, RV 19/2/3, PA 22/14 (18), PCWP 6, Fick CO/CI: 5.2 / 2.7, PVR 2.3 WU, PA 60% and 64% c) ECHO (05/2014): EF 20-25%, diff HK, akinesis entireanteroseptal myocardium, triv AI, mod MR, LA mod/sev dilated  . Cocaine abuse ([place]) 01/24/2015  . Cocaine abuse with cocaine-induced mood disorder ([place]) 08/20/2015  . Drug abuse and dependence ([place])   . Dysphagia following cerebral infarction 01/24/2015  . Embolic stroke involving right middle cerebral artery ([place])   . Extensive stage primary small cell carcinoma of lung ([place]) 01/04/2020  . H/O noncompliance with medical treatment, presenting hazards to health 01/24/2015  . Heart murmur   . High cholesterol   . Ischemic cardiomyopathy    a) Coronary angiography (12/2008) at [facility]: Lmain: nl, LAD mid 100% stenosis with left to left and right-to-left collaterals to the distal LAD; Lcx: nl, RCA nl.    . 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[place])   . MDD (major depressive disorder), recurrent severe, without psychosis ([place]) 08/18/2015  . Open wound of foot 02/27/2015  . Pneumonia 1988  . Psychoactive substance-induced mood disorder ([place]) 07/17/2015  . Sleep apnea     "cleared after T&A"  . Status post PICC central line placement   . Stroke ([place])   . Substance induced mood disorder ([place]) 08/17/2015  . Type II diabetes mellitus ([place])    Past Surgical History:  Past Surgical History:  Procedure Laterality Date  . BRONCHIAL BRUSHINGS  12/28/2019   Procedure: BRONCHIAL BRUSHINGS;  Surgeon: Garner Nash, DO;  Location: [facility] ENDOSCOPY;  Service: Thoracic;;  . BRONCHIAL NEEDLE ASPIRATION BIOPSY  12/28/2019   Procedure: BRONCHIAL NEEDLE ASPIRATION BIOPSIES;  Surgeon: Garner Nash, DO;  Location: [place];  Service: Thoracic;;  . BRONCHIAL WASHINGS  12/28/2019   Procedure: BRONCHIAL WASHINGS;  Surgeon: Garner Nash, DO;  Location: [place];  Service: Thoracic;;  . CARDIAC CATHETERIZATION  05/2014  . CARDIAC DEFIBRILLATOR PLACEMENT  03/2011  . CHOLECYSTECTOMY  1994  . FOREARM FRACTURE SURGERY Left 1980  . FRACTURE SURGERY    . RIGHT HEART CATHETERIZATION N/A 05/30/2014   Procedure: RIGHT HEART CATH;  Surgeon: Jolaine Artist, MD;  Location: [facility] CATH LAB;  Service: Cardiovascular;  Laterality: N/A;  . RIGHT HEART CATHETERIZATION N/A 02/07/2015   Procedure: RIGHT HEART CATH;  Surgeon: Jolaine Artist, MD;  Location: [facility] CATH LAB;  Service: Cardiovascular;  Laterality: N/A;  . TONSILLECTOMY AND ADENOIDECTOMY  ~ 1998  . VIDEO BRONCHOSCOPY WITH ENDOBRONCHIAL ULTRASOUND N/A 12/28/2019   Procedure: VIDEO BRONCHOSCOPY WITH ENDOBRONCHIAL ULTRASOUND;  Surgeon: Garner Nash, DO;  Location: [facility] ENDOSCOPY;  Service: Thoracic;  Laterality: N/A;   HPI:  59 yo male admitted with unsteady gait, slurred speech. Hx of  CHF, DM, CVA, pacemaker, MDD, metastatic lung ca, polysubstance abuse   Assessment / Plan / Recommendation Clinical Impression  Patient presents with a mild oral based dysphagia but with pharyngeal phase appearing WFL. No coughing, throat clearing or other overt signs that would be concerning for aspiration or penetration were observed. Patient  did exhibit some inefficiency with mastication of regular solids and left side anterior spillage during PO intake of solids. SLP Visit Diagnosis: Dysphagia, unspecified (R13.10)    Aspiration Risk  No limitations    Diet Recommendation Thin liquid;Regular   Liquid Administration via: Straw;Cup Medication Administration: Whole meds with liquid Supervision: Patient able to self feed Compensations: Slow rate;Small sips/bites;Lingual sweep for clearance of pocketing Postural Changes: Seated upright at 90 degrees    Other  Recommendations Oral Care Recommendations: Oral care BID   Follow up Recommendations None      Frequency and Duration min 1 x/week  1 week       Prognosis Prognosis for Safe Diet Advancement: Good      Swallow Study   General Date of Onset: 07/07/20 HPI: 59 yo male admitted with unsteady gait, slurred speech. Hx of CHF, DM, CVA, pacemaker, MDD, metastatic lung ca, polysubstance abuse Type of Study: Bedside Swallow Evaluation Previous Swallow Assessment: None Diet Prior to this Study: NPO Temperature Spikes Noted: No Respiratory Status: Room air History of Recent Intubation: No Behavior/Cognition: Alert;Cooperative;Pleasant mood Oral Cavity Assessment: Within Functional Limits Oral Care Completed by SLP: Yes Oral Cavity - Dentition: Adequate natural dentition Vision: Functional for self-feeding Self-Feeding Abilities: Able to feed self Patient Positioning: Upright in bed Baseline Vocal Quality: Normal Volitional Cough: Strong Volitional Swallow: Able to elicit    Oral/Motor/Sensory Function Overall Oral Motor/Sensory Function: Mild impairment Facial ROM: Within Functional Limits Facial Symmetry: Within Functional Limits Facial Strength: Within Functional Limits Facial Sensation: Within Functional Limits Lingual ROM: Within Functional Limits Lingual Symmetry: Within Functional Limits Lingual Strength: Reduced Velum: Within Functional  Limits Mandible: Within Functional Limits   Ice Chips     Thin Liquid Thin Liquid: Within functional limits Presentation: Self Fed;Straw    Nectar Thick     Honey Thick     Puree Puree: Within functional limits Presentation: Self Fed   Solid     Solid: Impaired Oral Phase Functional Implications: Left anterior spillage Other Comments: Patient with minimal left anterior spillage which he did not demonstrate awareness of. Of note, patient had difficulty with fine motor control with managing utensils, etc during PO intake.     Sonia Baller, MA, CCC-SLP Speech Therapy

## 2020-07-08 NOTE — Progress Notes (Signed)
Pt was standing at the side of bed attempting to void. NT in room with patient. Pt states that he lost his balance and laid back down on bed. Pt got back into bed without injury. Vital signs stable. MD notified.

## 2020-07-08 NOTE — Progress Notes (Signed)
Pt became very tearful for no apparent reason and stated, "I don't know why I am crying". The patients daughter is at bedside and states it is very unlike the patient to cry. She stated that is happened once yesterday in the ED as well. MD notified. Will continue to monitor patient.

## 2020-07-08 NOTE — Progress Notes (Signed)
ANTICOAGULATION CONSULT NOTE -  Consult  Pharmacy Consult for Warfarin Indication: advanced cardiomyopathy (target INR 2-3)  No Known Allergies  Patient Measurements:   Heparin Dosing Weight:   Vital Signs: BP: 99/64 (09/26 0037) Pulse Rate: 93 (09/26 0037)  Labs: Recent Labs    07/07/20 1315  HGB 9.8*  HCT 30.4*  PLT 319  LABPROT 13.7  INR 1.1  CREATININE 1.33*    CrCl cannot be calculated (Unknown ideal weight.).   Medical History: Past Medical History:  Diagnosis Date  . Alcohol abuse with alcohol-induced mood disorder ([place]) 08/18/2015  . Automatic implantable cardioverter-defibrillator in situ 2012   S/P St Jude Dual Chamber. ICD.  Marland Kitchen CAD (coronary artery disease) 11/16/2014   CAD Coronary angiography (12/2008) with mid LAD totally occluded and collateralized.  . Cardiac LV ejection fraction 10-20%   . Chest pain 09/04/2018  . CHF (congestive heart failure) ([place])   . Chronic systolic heart failure (HCC)    a) Mixed ICM/NICM b) RHC (05/2014): RA 2, RV 19/2/3, PA 22/14 (18), PCWP 6, Fick CO/CI: 5.2 / 2.7, PVR 2.3 WU, PA 60% and 64% c) ECHO (05/2014): EF 20-25%, diff HK, akinesis entireanteroseptal myocardium, triv AI, mod MR, LA mod/sev dilated  . Cocaine abuse ([place]) 01/24/2015  . Cocaine abuse with cocaine-induced mood disorder ([place]) 08/20/2015  . Drug abuse and dependence ([place])   . Dysphagia following cerebral infarction 01/24/2015  . Embolic stroke involving right middle cerebral artery ([place])   . Extensive stage primary small cell carcinoma of lung ([place]) 01/04/2020  . H/O noncompliance with medical treatment, presenting hazards to health 01/24/2015  . Heart murmur   . High cholesterol   . Ischemic cardiomyopathy    a) Coronary angiography (12/2008) at [facility]: Lmain: nl, LAD mid 100% stenosis with left to left and right-to-left collaterals to the distal LAD; Lcx: nl, RCA nl.    . 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[place])   . MDD (major depressive disorder), recurrent  severe, without psychosis ([place]) 08/18/2015  . Open wound of foot 02/27/2015  . Pneumonia 1988  . Psychoactive substance-induced mood disorder ([place]) 07/17/2015  . Sleep apnea    "cleared after T&A"  . Status post PICC central line placement   . Stroke ([place])   . Substance induced mood disorder ([place]) 08/17/2015  . Type II diabetes mellitus (HCC)     Medications:  Scheduled:  .  stroke: mapping our early stages of recovery book   Does not apply Once  . atorvastatin  20 mg Oral Daily  . carvedilol  6.25 mg Oral QPM  . insulin aspart  0-15 Units Subcutaneous TID AC & HS  . insulin glargine  36 Units Subcutaneous Q2200  . sacubitril-valsartan  1 tablet Oral QPM  . spironolactone  25 mg Oral Daily  . warfarin  3.75 mg Oral Once per day on Sun Mon Wed Fri  . [START ON 07/10/2020] warfarin  7.5 mg Oral Once per day on Tue Thu Sat    Assessment: Pharmacy is consulted to dose warfarin in 59 yo male with advanced cardiomyopathy.   Per med rec and chart records on visit on 9/3, pt is on Warfarin 7.5 mg On Tues/Thur/Sat and 3.75 mg on all other days   Today, 07/08/20   Baseline INR is 1.1, subtherapeutic, questionable compliance  Hgb 9.8, plt 319   No new drug interacting meds started     Goal of Therapy:  INR 2-3 Monitor platelets by anticoagulation protocol: Yes   Plan:  As INR is below goal, will give warfarin 7.5 mg PO x1 now   Daily INR   Monitor for signs and symptoms of bleeding   Royetta Asal, PharmD, BCPS 07/08/2020 2:28 AM

## 2020-07-08 NOTE — Progress Notes (Signed)
Upon standing at bedside with PT, pt fell back on to bed and became unresponsive for aprox. 10-15 seconds. As patient became more alert, he was noted to be diaphoretic and lethargic. CBG 74. BP manually 110/76. Pt did not remember falling. MD notified and came to bedside to assess patient. Verbal orders given for patient to remain in the bed at this time. Will continue to monitor patient.

## 2020-07-08 NOTE — H&P (Signed)
History and Physical    Dennis [name]. JSH:[id number] DOB: 02/28/1961 DOA: 07/07/2020  PCP: [name], MD  Patient coming from: Home   Chief Complaint:  Chief Complaint  Patient presents with  . 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  Patient explains that approximately 4 days ago he woke up and realized that he suddenly had difficulty with balance upon rising out of bed.  Patient states that he felt whenever he was ambulating that he would fall.  Patient denies any associated lightheadedness or loss of consciousness.  At approximately the same time patient also noticed that he was having tingling in his left upper extremity and also noticed that he was slurring his words.  Patient denies any associated visual changes, fevers, cough, shortness of breath, dysuria, nausea, vomiting, abdominal pain, sick contacts, recent travel or contacts with confirmed COVID-19 infection.  Patient states that he is compliant with all of his medications.  Patient does admit to ongoing cocaine abuse, last use on 9/24.  Due to patient's persisting neurologic deficits the patient eventually presented to [facility] emergency department on 9/25 for evaluation.  Upon evaluation in the emergency department, case was discussed with Dr. Malen Gauze with the emergency department staff and it was recommended the patient undergo CT imaging of the head with and without contrast.  The hospitalist group was then called to assess patient for mission to the  hospital.  Review of Systems:   Review of Systems  Neurological: Positive for sensory change.       Unsteady gait, slurred speech  All other systems reviewed and are negative.   Past Medical History:  Diagnosis Date  . Alcohol abuse with alcohol-induced mood disorder ([place]) 08/18/2015  . Automatic implantable cardioverter-defibrillator in situ 2012   S/P St Jude Dual Chamber. ICD.  Marland Kitchen CAD (coronary artery disease) 11/16/2014   CAD Coronary angiography (12/2008) with mid LAD totally occluded and collateralized.  . Cardiac LV ejection fraction 10-20%   . Chest pain 09/04/2018  . CHF (congestive heart failure) ([place])   . Chronic systolic heart failure (HCC)    a) Mixed ICM/NICM b) RHC (05/2014): RA 2, RV 19/2/3, PA 22/14 (18), PCWP 6, Fick CO/CI: 5.2 / 2.7, PVR 2.3 WU, PA 60% and 64% c) ECHO (05/2014): EF 20-25%, diff HK, akinesis entireanteroseptal myocardium, triv AI, mod MR, LA mod/sev dilated  . Cocaine abuse ([place]) 01/24/2015  . Cocaine abuse with cocaine-induced mood disorder ([place]) 08/20/2015  . Drug abuse and dependence ([place])   . Dysphagia following cerebral infarction 01/24/2015  . Embolic stroke involving right middle cerebral artery ([place])   . Extensive stage primary small cell carcinoma of lung ([place]) 01/04/2020  . H/O noncompliance with medical treatment, presenting hazards to health 01/24/2015  . Heart murmur   . High cholesterol   . Ischemic cardiomyopathy    a) Coronary angiography (12/2008) at [facility]: Lmain: nl, LAD mid 100% stenosis with left to left and right-to-left collaterals to the distal LAD; Lcx: nl, RCA nl.    . 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[place])   . MDD (major depressive disorder),  recurrent severe, without psychosis ([place]) 08/18/2015  . Open wound of foot 02/27/2015  . Pneumonia 1988  . Psychoactive substance-induced mood disorder ([place]) 07/17/2015  . Sleep apnea    "cleared after T&A"  . Status post PICC central line placement   . Stroke ([place])   . Substance induced  mood disorder ([place]) 08/17/2015  . Type II diabetes mellitus ([place])     Past Surgical History:  Procedure Laterality Date  . BRONCHIAL BRUSHINGS  12/28/2019   Procedure: BRONCHIAL BRUSHINGS;  Surgeon: Garner Nash, DO;  Location: [facility];  Service: Thoracic;;  . BRONCHIAL NEEDLE ASPIRATION BIOPSY  12/28/2019   Procedure: BRONCHIAL NEEDLE ASPIRATION BIOPSIES;  Surgeon: Garner Nash, DO;  Location: [place];  Service: Thoracic;;  . BRONCHIAL WASHINGS  12/28/2019   Procedure: BRONCHIAL WASHINGS;  Surgeon: Garner Nash, DO;  Location: [place];  Service: Thoracic;;  . CARDIAC CATHETERIZATION  05/2014  . CARDIAC DEFIBRILLATOR PLACEMENT  03/2011  . CHOLECYSTECTOMY  1994  . FOREARM FRACTURE SURGERY Left 1980  . FRACTURE SURGERY    . RIGHT HEART CATHETERIZATION N/A 05/30/2014   Procedure: RIGHT HEART CATH;  Surgeon: Jolaine Artist, MD;  Location: [facility] CATH LAB;  Service: Cardiovascular;  Laterality: N/A;  . RIGHT HEART CATHETERIZATION N/A 02/07/2015   Procedure: RIGHT HEART CATH;  Surgeon: Jolaine Artist, MD;  Location: [facility] CATH LAB;  Service: Cardiovascular;  Laterality: N/A;  . TONSILLECTOMY AND ADENOIDECTOMY  ~ 1998  . VIDEO BRONCHOSCOPY WITH ENDOBRONCHIAL ULTRASOUND N/A 12/28/2019   Procedure: VIDEO BRONCHOSCOPY WITH ENDOBRONCHIAL ULTRASOUND;  Surgeon: Garner Nash, DO;  Location: [facility];  Service: Thoracic;  Laterality: N/A;     reports that he has quit smoking. His smoking use included cigarettes. He has a 7.75 pack-year smoking history. He has never used smokeless tobacco. He reports previous alcohol use. He reports current drug use. Drug: Cocaine.  No Known Allergies  Family History  Problem Relation Age of Onset  . Diabetes Mother   . Heart disease Mother   . Diabetes Father   . Prostate cancer Father   . Heart disease Father   . Diabetes Brother   . Diabetes Brother      Prior to Admission medications   Medication Sig Start Date End Date  Taking? Authorizing Provider  atorvastatin (LIPITOR) 20 MG tablet TAKE 1 TABLET BY MOUTH DAILY AT 6 PM. Patient taking differently: Take 20 mg by mouth daily at 6 PM.  03/11/17  Yes Bensimhon, Shaune Pascal, MD  carvedilol (COREG) 6.25 MG tablet Take 1.5 tablets (9.375 mg total) by mouth 2 (two) times daily with a meal. Patient taking differently: Take 6.25 mg by mouth every evening.  08/05/19  Yes Bensimhon, Shaune Pascal, MD  Insulin Glargine (LANTUS SOLOSTAR) 100 UNIT/ML Solostar Pen Inject 36 Units into the skin daily at 10 pm.    Yes [provider]  metFORMIN (GLUCOPHAGE-XR) 500 MG 24 hr tablet Take 1,000 mg by mouth daily with breakfast.    Yes Patel, Nilay, DO  sacubitril-valsartan (ENTRESTO) 24-26 MG Take 1 tablet by mouth 2 (two) times daily. Patient taking differently: Take 2 tablets by mouth every evening.  01/24/20  Yes Bensimhon, Shaune Pascal, MD  spironolactone (ALDACTONE) 25 MG tablet Take 1 tablet (25 mg total) by mouth daily. 01/02/20  Yes Bensimhon, Shaune Pascal, MD  warfarin (COUMADIN) 7.5 MG tablet TAKE 1 TABLET BY MOUTH DAILY AT 6PM. Patient taking differently: Take 7.5 mg by mouth every evening.  01/20/20  Yes  Bensimhon, Shaune Pascal, MD  Accu-Chek Softclix Lancets lancets  02/03/20   [provider]  Blood Glucose Monitoring Suppl (FIFTY50 GLUCOSE METER 2.0) w/Device KIT Use as instructed 02/03/20   [provider]  INSUPEN ULTRAFIN 31G X 8 MM [place]  03/02/20   [provider]  ondansetron (ZOFRAN) 8 MG tablet Take 1 tablet (8 mg total) by mouth every 8 (eight) hours as needed for nausea or vomiting. Patient not taking: Reported on 06/20/2020 02/21/20   Curt Bears, MD  prochlorperazine (COMPAZINE) 10 MG tablet Take 1 tablet (10 mg total) by mouth every 6 (six) hours as needed. Patient not taking: Reported on 06/20/2020 01/19/20   Heilingoetter, Tobe Sos, PA-C    Physical Exam: Vitals:   07/07/20 1700 07/07/20 2017 07/07/20 2224 07/08/20 0037  BP: 110/77 120/77  109/80 99/64  Pulse: 90 98 95 93  Resp: _0 Temp:      TempSrc:      SpO2: 96% 100% 99% 98%    Constitutional: Acute alert and oriented x3, no associated distress.   Skin: no rashes, no lesions, good skin turgor noted. Eyes: Pupils are equally reactive to light.  No evidence of scleral icterus or conjunctival pallor.  ENMT: Moist mucous membranes noted.  Posterior pharynx clear of any exudate or lesions.   Neck: normal, supple, no masses, no thyromegaly.  No evidence of jugular venous distension.   Respiratory: clear to auscultation bilaterally, no wheezing, no crackles. Normal respiratory effort. No accessory muscle use.  Cardiovascular: Regular rate and rhythm, no murmurs / rubs / gallops. No extremity edema. 2+ pedal pulses. No carotid bruits.  Chest:   Nontender without crepitus or deformity.   Back:   Nontender without crepitus or deformity. Abdomen: Abdomen is soft and nontender.  No evidence of intra-abdominal masses.  Positive bowel sounds noted in all quadrants.   Musculoskeletal: No joint deformity upper and lower extremities. Good ROM, no contractures. Normal muscle tone.  Neurologic: Notable slurring of speech during interview.  Remainder of cranial nerves seem intact.  Patient exhibiting notable weakness of the left lower extremity in both proximal and distal muscle groups although patient states that this is chronic.  Somewhat diminished sensation of the left upper extremity.  Patient is following all commands.  Patient is responsive to verbal stimuli.   Psychiatric: Patient exhibits normal mood with appropriate affect.  Patient seems to possess insight as to their current situation.     Labs on Admission: I have personally reviewed following labs and imaging studies -   CBC: Recent Labs  Lab 07/07/20 1315  WBC 7.4  HGB 9.8*  HCT 30.4*  MCV 89.4  PLT 371   Basic Metabolic Panel: Recent Labs  Lab 07/07/20 1315  NA 136  K 4.6  CL 95*  CO2 28  GLUCOSE  188*  BUN 23*  CREATININE 1.33*  CALCIUM 9.7   GFR: CrCl cannot be calculated (Unknown ideal weight.). Liver Function Tests: Recent Labs  Lab 07/07/20 1315  AST 20  ALT 17  ALKPHOS 88  BILITOT 0.8  PROT 7.4  ALBUMIN 3.2*   No results for input(s): LIPASE, AMYLASE in the last 168 hours. No results for input(s): AMMONIA in the last 168 hours. Coagulation Profile: Recent Labs  Lab 07/07/20 1315  INR 1.1   Cardiac Enzymes: No results for input(s): CKTOTAL, CKMB, CKMBINDEX, TROPONINI in the last 168 hours. BNP (last 3 results) No results for input(s): PROBNP in the last 8760 hours. HbA1C:  No results for input(s): HGBA1C in the last 72 hours. CBG: No results for input(s): GLUCAP in the last 168 hours. Lipid Profile: No results for input(s): CHOL, HDL, LDLCALC, TRIG, CHOLHDL, LDLDIRECT in the last 72 hours. Thyroid Function Tests: No results for input(s): TSH, T4TOTAL, FREET4, T3FREE, THYROIDAB in the last 72 hours. Anemia Panel: No results for input(s): VITAMINB12, FOLATE, FERRITIN, TIBC, IRON, RETICCTPCT in the last 72 hours. Urine analysis:    Component Value Date/Time   COLORURINE YELLOW 07/07/2020 1628   APPEARANCEUR CLEAR 07/07/2020 1628   LABSPEC 1.014 07/07/2020 1628   PHURINE 5.0 07/07/2020 1628   GLUCOSEU 50 (A) 07/07/2020 1628   HGBUR NEGATIVE 07/07/2020 1628   BILIRUBINUR NEGATIVE 07/07/2020 1628   KETONESUR NEGATIVE 07/07/2020 1628   PROTEINUR 100 (A) 07/07/2020 1628   UROBILINOGEN 1.0 07/16/2015 1715   NITRITE NEGATIVE 07/07/2020 1628   LEUKOCYTESUR NEGATIVE 07/07/2020 1628    Radiological Exams on Admission - Personally Reviewed: CT HEAD WO CONTRAST  Result Date: 07/07/2020 CLINICAL DATA:  Slurred speech and dizziness.  History of CVA. EXAM: CT HEAD WITHOUT CONTRAST TECHNIQUE: Contiguous axial images were obtained from the base of the skull through the vertex without intravenous contrast. COMPARISON:  June 07, 2020 FINDINGS: Brain: No evidence of  acute infarction, hemorrhage, hydrocephalus, extra-axial collection or mass lesion/mass effect. Stable large area of hypoattenuation in the subcortical right frontal lobe and insula, with associated mild ex vacuo dilatation of the right lateral ventricle, sequela of prior right MCA infarct. Vascular: No hyperdense vessel or unexpected calcification. Skull: Normal. Negative for fracture or focal lesion. Sinuses/Orbits: No acute finding. Other: None. IMPRESSION: 1. No acute intracranial abnormality. 2. Stable large area of hypoattenuation in the subcortical right frontal lobe and insula, with associated mild ex vacuo dilatation of the right lateral ventricle, sequela of prior right MCA infarct. Electronically Signed   By: Fidela Salisbury M.D.   On: 07/07/2020 15:17   CT HEAD W CONTRAST  Result Date: 07/07/2020 CLINICAL DATA:  Neuro deficit EXAM: CT HEAD WITH CONTRAST TECHNIQUE: Contiguous axial images were obtained from the base of the skull through the vertex with intravenous contrast. CONTRAST:  129m OMNIPAQUE IOHEXOL 300 MG/ML  SOLN COMPARISON:  07/07/2020 head CT.  06/06/2020 head CT and prior. FINDINGS: Brain: Sequela of prior right frontal insult. No acute infarct. No intracranial hemorrhage. No abnormal enhancement or mass lesion. Mild cerebral atrophy with ex vacuo dilatation. No midline shift, ventriculomegaly or extra-axial fluid collection. Vascular: Normal intravascular enhancement. Skull: No acute finding. Sinuses/Orbits: No acute finding. Mild ethmoid sinus mucosal thickening. Other: None. IMPRESSION: No acute intracranial process.  No abnormal enhancement. Sequela of prior right frontal insult, unchanged. Electronically Signed   By: CPrimitivo GauzeM.D.   On: 07/07/2020 19:43   DG Chest Port 1 View  Result Date: 07/07/2020 CLINICAL DATA:  Chills and weakness.  History of lung cancer. EXAM: PORTABLE CHEST 1 VIEW COMPARISON:  Chest radiograph 10/07/2019. FINDINGS: Stable multi lead pacer  apparatus overlying the left hemithorax. Stable cardiac and mediastinal contours. No large area pulmonary consolidation. No pleural effusion or pneumothorax. Small right middle lobe spiculated nodule not well demonstrated on current exam. IMPRESSION: No acute cardiopulmonary process. Electronically Signed   By: DLovey NewcomerM.D.   On: 07/07/2020 13:54    EKG: Personally reviewed.  Rhythm is normal sinus rhythm with heart rate of 87 bpm.  No dynamic ST segment changes appreciated.  Assessment/Plan Principal Problem:   Unsteady gait when walking   Patient presenting with  several day history of sudden onset unsteady gait, slurring of speech and left upper extremity tingling.  Patient's presentation is extremely complicated with remote history of right MCA stroke as well as small cell lung cancer with metastatic lesion to the brain diagnosed in March 2021  Case was initially discussed with Dr. Malen Gauze by the emergency department staff who at that time recommended CT imaging of the head with and without contrast due to the presence of a non-MRI safe ICD.  I have now discussed the case with Dr. Cheral Marker with neurology on night shift.  We have reviewed the patient's images together and discussed the patient's case.  -Repeat CT imaging the head does reveal that the metastatic lesion seems to have dissipated without any evidence of enhancing lesions on current CT.  It is possible that patient has an underlying stroke that is just not seen on the CT however Dr. Cheral Marker believes the best way to identify this would be to perform repeat noncontrast CT imaging of the head in approximately 3 days, monitoring patient in the meantime.  In the meantime Dr. Cheral Marker recommends ensuring patient has complete encephalopathy work-up, is hydrated gently due to concern for mild dehydration and undergo neurologic checks.  He also recommends cessation counseling considering the patient's ongoing cocaine abuse.  I have  additionally ordered physical therapy evaluation.  Continuing home regimen of anticoagulation as well as antihypertensive regimen.  Monitoring patient on telemetry  Active Problems:   Slurred speech   Please see assessment and plan above.    Long term (current) use of anticoagulants   Patient reports having been on Coumadin ever since his diagnosis of advanced cardiomyopathy years ago  INR of 1.1 suggest the patient is likely noncompliant with this regimen  We will redose Coumadin with the assistance of pharmacy, target INR between 2 and 3.    Chronic systolic heart failure (HCC)   Patient does not give the appearance of cardiogenic volume overload and in fact may be slightly volume depleted  Continuing home regimen of spironolactone, Coreg and Entresto    Hyperlipidemia associated with type 2 diabetes mellitus ([place])   Continue home regimen of statin therapy    Cocaine abuse ([place])   Counseling the patient daily on cessation of cocaine which is ongoing despite patient's numerous serious comorbidities  It is seemingly been greater than 24 hours since the patient's last dose of Coreg and therefore this will be resumed tomorrow morning.    Essential hypertension   Continuing home regimen of antihypertensive therapy    Small cell lung cancer, right upper lobe [facility])   Actively receiving chemotherapy  CT imaging of the brain today reveals no enhancing evidence of previous brain metastasis.  Continue outpatient follow-up    Uncontrolled type 2 diabetes mellitus with hyperglycemia, with long-term current use of insulin (HCC)   Accu-Cheks before every meal and nightly with sliding scale insulin  Hemoglobin A1c pending  Continuing home regimen of basal insulin    Automatic implantable cardioverter-defibrillator in situ    Secondary to advanced cardiomyopathy  Unfortunately this is not an MRI safe ICD.    Code Status:  Full code Family Communication:  deferred   Status is: Observation  The patient remains OBS appropriate and will d/c before 2 midnights.  Dispo: The patient is from: Home              Anticipated d/c is to: Home              Anticipated  d/c date is: 2 days              Patient currently is not medically stable to d/c.        Vernelle Emerald MD [facility] Pager [phone]  If 7PM-7AM, please contact night-coverage [email or web address] Use universal [place] password for that web site. If you do not have the password, please call the hospital operator.  07/08/2020, 1:51 AM

## 2020-07-08 NOTE — Evaluation (Signed)
Speech Language Pathology Evaluation Patient Details Name: Dennis [name]. MRN: [id number] DOB: 1961-02-23 Today's Date: 07/08/2020 Time: 1430-1450 SLP Time Calculation (min) (ACUTE ONLY): 20 min  Problem List:  Patient Active Problem List   Diagnosis Date Noted  . Slurred speech 07/08/2020  . Uncontrolled type 2 diabetes mellitus with hyperglycemia, with long-term current use of insulin ([place]) 07/08/2020  . Unsteady gait when walking 07/07/2020  . Small cell lung cancer, right upper lobe ([place]) 01/19/2020  . Encounter for antineoplastic chemotherapy 01/19/2020  . Encounter for antineoplastic immunotherapy 01/19/2020  . Extensive stage primary small cell carcinoma of lung ([place]) 01/04/2020  . Goals of care, counseling/discussion 01/04/2020  . Mediastinal adenopathy   . Mass of upper lobe of right lung   . Brain mass 12/23/2019  . Vasogenic cerebral edema ([place]) 12/23/2019  . Left leg paresthesias   . History of ischemic right MCA stroke   . Acute on chronic systolic congestive heart failure ([place])   . Left-sided weakness   . 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[place])   . Cocaine abuse ([place]) 01/24/2015  . Essential hypertension 01/24/2015  . Left hemiplegia ([place]) 01/24/2015  . Hyperlipidemia associated with type 2 diabetes mellitus ([place]) 11/30/2014  . Chronic systolic heart failure ([place]) 06/20/2014  . Automatic implantable cardioverter-defibrillator in situ   . Long term (current) use of anticoagulants 08/09/2013  . Drug abuse and dependence ([place])   . Cardiomyopathy, ischemic 07/21/2011  . Type II diabetes mellitus with ophthalmic manifestations ([place]) 09/13/2007   Past Medical History:  Past Medical History:  Diagnosis Date  . Alcohol abuse with alcohol-induced mood disorder ([place]) 08/18/2015  . Automatic implantable cardioverter-defibrillator in situ 2012   S/P St Jude Dual Chamber. ICD.  Marland Kitchen CAD (coronary artery disease) 11/16/2014   CAD Coronary angiography (12/2008) with mid LAD  totally occluded and collateralized.  . Cardiac LV ejection fraction 10-20%   . Chest pain 09/04/2018  . CHF (congestive heart failure) ([place])   . Chronic systolic heart failure (HCC)    a) Mixed ICM/NICM b) RHC (05/2014): RA 2, RV 19/2/3, PA 22/14 (18), PCWP 6, Fick CO/CI: 5.2 / 2.7, PVR 2.3 WU, PA 60% and 64% c) ECHO (05/2014): EF 20-25%, diff HK, akinesis entireanteroseptal myocardium, triv AI, mod MR, LA mod/sev dilated  . Cocaine abuse ([place]) 01/24/2015  . Cocaine abuse with cocaine-induced mood disorder ([place]) 08/20/2015  . Drug abuse and dependence ([place])   . Dysphagia following cerebral infarction 01/24/2015  . Embolic stroke involving right middle cerebral artery ([place])   . Extensive stage primary small cell carcinoma of lung ([place]) 01/04/2020  . H/O noncompliance with medical treatment, presenting hazards to health 01/24/2015  . Heart murmur   . High cholesterol   . Ischemic cardiomyopathy    a) Coronary angiography (12/2008) at [facility]: Lmain: nl, LAD mid 100% stenosis with left to left and right-to-left collaterals to the distal LAD; Lcx: nl, RCA nl.    . 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[place])   . MDD (major depressive disorder), recurrent severe, without psychosis ([place]) 08/18/2015  . Open wound of foot 02/27/2015  . Pneumonia 1988  . Psychoactive substance-induced mood disorder ([place]) 07/17/2015  . Sleep apnea    "cleared after T&A"  . Status post PICC central line placement   . Stroke ([place])   . Substance induced mood disorder ([place]) 08/17/2015  . Type II diabetes mellitus ([place])    Past Surgical History:  Past Surgical History:  Procedure Laterality Date  . BRONCHIAL BRUSHINGS  12/28/2019  Procedure: BRONCHIAL BRUSHINGS;  Surgeon: Garner Nash, DO;  Location: [facility];  Service: Thoracic;;  . BRONCHIAL NEEDLE ASPIRATION BIOPSY  12/28/2019   Procedure: BRONCHIAL NEEDLE ASPIRATION BIOPSIES;  Surgeon: Garner Nash, DO;  Location: [place];  Service: Thoracic;;  . BRONCHIAL  WASHINGS  12/28/2019   Procedure: BRONCHIAL WASHINGS;  Surgeon: Garner Nash, DO;  Location: [place];  Service: Thoracic;;  . CARDIAC CATHETERIZATION  05/2014  . CARDIAC DEFIBRILLATOR PLACEMENT  03/2011  . CHOLECYSTECTOMY  1994  . FOREARM FRACTURE SURGERY Left 1980  . FRACTURE SURGERY    . RIGHT HEART CATHETERIZATION N/A 05/30/2014   Procedure: RIGHT HEART CATH;  Surgeon: Jolaine Artist, MD;  Location: [facility] CATH LAB;  Service: Cardiovascular;  Laterality: N/A;  . RIGHT HEART CATHETERIZATION N/A 02/07/2015   Procedure: RIGHT HEART CATH;  Surgeon: Jolaine Artist, MD;  Location: [facility] CATH LAB;  Service: Cardiovascular;  Laterality: N/A;  . TONSILLECTOMY AND ADENOIDECTOMY  ~ 1998  . VIDEO BRONCHOSCOPY WITH ENDOBRONCHIAL ULTRASOUND N/A 12/28/2019   Procedure: VIDEO BRONCHOSCOPY WITH ENDOBRONCHIAL ULTRASOUND;  Surgeon: Garner Nash, DO;  Location: [facility];  Service: Thoracic;  Laterality: N/A;   HPI:  59 yo male admitted with unsteady gait, slurred speech. Hx of CHF, DM, CVA, pacemaker, MDD, metastatic lung ca, polysubstance abuse   Assessment / Plan / Recommendation Clinical Impression  Patient presents with a mild flaccid dysarthria which impacts quality of his speech but with intelligibilty at sentence and conversational level approximately 85-90%. Cognition and language functioning both WFL and patient without any memory, reasoning, problem solving, safety awareness deficits. As patient is able to effectively communicate all wants/needs/thoughts, will defer speech therapy for dysarthria to next venue of care if still needed at that time.    SLP Assessment  SLP Recommendation/Assessment: All further Speech Lanaguage Pathology  needs can be addressed in the next venue of care SLP Visit Diagnosis: Dysarthria and anarthria (R47.1)    Follow Up Recommendations  Home health SLP    Frequency and Duration           SLP Evaluation Cognition  Overall Cognitive Status: Within  Functional Limits for tasks assessed Arousal/Alertness: Awake/alert Orientation Level: Oriented X4 Awareness: Appears intact Problem Solving: Appears intact Safety/Judgment: Appears intact       Comprehension  Auditory Comprehension Overall Auditory Comprehension: Appears within functional limits for tasks assessed    Expression Expression Primary Mode of Expression: Verbal   Oral / Motor  Oral Motor/Sensory Function Overall Oral Motor/Sensory Function: Mild impairment Facial ROM: Within Functional Limits Facial Symmetry: Within Functional Limits Facial Strength: Within Functional Limits Facial Sensation: Within Functional Limits Lingual ROM: Within Functional Limits Lingual Symmetry: Within Functional Limits Lingual Strength: Reduced Velum: Within Functional Limits Mandible: Within Functional Limits Motor Speech Overall Motor Speech: Impaired Respiration: Within functional limits Phonation: Normal Resonance: Within functional limits Articulation: Impaired Level of Impairment: Conversation Intelligibility: Intelligibility reduced Word: 75-100% accurate Phrase: 75-100% accurate Sentence: 75-100% accurate Conversation: 75-100% accurate Motor Planning: Witnin functional limits Motor Speech Errors: Not applicable   GO                   Sonia Baller, MA, CCC-SLP Speech Therapy

## 2020-07-08 NOTE — Plan of Care (Signed)
  Problem: Pain Managment: Goal: General experience of comfort will improve Outcome: Progressing   

## 2020-07-08 NOTE — Evaluation (Addendum)
Physical Therapy Evaluation Patient Details Name: Dennis [name]. MRN: [id number] DOB: [birth year] Today's Date: 07/08/2020   History of Present Illness  59 yo male admitted with unsteady gait, slurred speech. Hx of CHF, DM, CVA, pacemaker, MDD, metastatic lung ca, polysubstance abuse  Clinical Impression  Limited PT eval. Supv for bed mobility, Min guard to stand. Once standing at bedside, pt requested to use urinal. After ~1-2 minutes, pt suddenly fell backwards onto bed. Pt was unresponsive for at least 10 seconds before coming to and sitting up at EOB again. Noted pt to be diaphoretic, with slurred speech and some drooling. He was somewhat disoriented initially. Pt did not recall events. Called NT and RN into room to assess pt. Deferred any further activity for safety reasons. Will plan to follow and continue to assess as safely able. Unsure of d/c plan at this time.     Follow Up Recommendations Supervision/Assistance - 24 hour (continuing to assess)    Equipment Recommendations   (continuing to assess)    Recommendations for Other Services OT consult     Precautions / Restrictions Precautions Precautions: Fall Precaution Comments: high fall risk! passed out with no warning signs with NT x 1 and  x 1 on PT eval Restrictions Weight Bearing Restrictions: No      Mobility  Bed Mobility Overal bed mobility: Needs Assistance Bed Mobility: Supine to Sit     Supine to sit: Supervision        Transfers Overall transfer level: Needs assistance   Transfers: Sit to/from Stand Sit to Stand: Min guard         General transfer comment: Pt requested to stand at EOB and use urinal before attempting ambulation. After standing for ~1-2 minutes, pt sudently fell backwards onto bed. Unresponsive for ~10 seconds before being able to sit back upright with PT assistance (minimal).  Ambulation/Gait             General Gait Details: Unable to assess on today for safety  reasons  Stairs            Wheelchair Mobility    Modified Rankin (Stroke Patients Only)       Balance Overall balance assessment: Needs assistance   Sitting balance-Leahy Scale: Normal                                       Pertinent Vitals/Pain Pain Assessment: No/denies pain    Home Living Family/patient expects to be discharged to:: Private residence Living Arrangements: Children;Parent Available Help at Discharge: Family Type of Home: House Home Access: Stairs to enter Entrance Stairs-Rails: Right Entrance Stairs-Number of Steps: 3 Home Layout: Two level;Able to live on main level with bedroom/bathroom Home Equipment: Gilford Rile - 2 wheels      Prior Function Level of Independence: Independent               Hand Dominance        Extremity/Trunk Assessment   Upper Extremity Assessment Upper Extremity Assessment: Defer to OT evaluation    Lower Extremity Assessment Lower Extremity Assessment: Overall WFL for tasks assessed    Cervical / Trunk Assessment Cervical / Trunk Assessment: Normal  Communication   Communication: No difficulties  Cognition Arousal/Alertness: Awake/alert Behavior During Therapy: WFL for tasks assessed/performed Overall Cognitive Status: Within Functional Limits for tasks assessed  General Comments      Exercises     Assessment/Plan    PT Assessment Patient needs continued PT services  PT Problem List Decreased mobility       PT Treatment Interventions DME instruction;Gait training;Therapeutic activities;Therapeutic exercise;Patient/family education;Balance training;Functional mobility training    PT Goals (Current goals can be found in the Care Plan section)  Acute Rehab PT Goals Patient Stated Goal: none stated PT Goal Formulation: With patient Time For Goal Achievement: 07/22/20 Potential to Achieve Goals: Good    Frequency Min  3X/week   Barriers to discharge        Co-evaluation               AM-PAC PT "6 Clicks" Mobility  Outcome Measure Help needed turning from your back to your side while in a flat bed without using bedrails?: None Help needed moving from lying on your back to sitting on the side of a flat bed without using bedrails?: A Little Help needed moving to and from a bed to a chair (including a wheelchair)?: A Little Help needed standing up from a chair using your arms (e.g., wheelchair or bedside chair)?: A Little Help needed to walk in hospital room?: Total Help needed climbing 3-5 steps with a railing? : Total 6 Click Score: 15    End of Session Equipment Utilized During Treatment: Gait belt Activity Tolerance:  (limited by LOC) Patient left: with nursing/sitter in room;with call bell/phone within reach (sitting EOB with NTs and RN assessing vitals)   PT Visit Diagnosis: Difficulty in walking, not elsewhere classified (R26.2)    Time: 8250-5397 PT Time Calculation (min) (ACUTE ONLY): 15 min   Charges:   PT Evaluation $PT Eval Low Complexity: [place], PT Acute Rehabilitation  Office: [phone] Pager: [phone]

## 2020-07-08 NOTE — Progress Notes (Signed)
ANTICOAGULATION CONSULT NOTE -  Consult  Pharmacy Consult for Warfarin Indication: advanced cardiomyopathy (target INR 2-3)  No Known Allergies  Patient Measurements: Height: 6' (182.9 cm) Weight: 62.1 kg (137 lb) IBW/kg (Calculated) : 77.6 Heparin Dosing Weight:   Vital Signs: Temp: 98.2 F (36.8 C) (09/26 0542) Temp Source: Oral (09/26 0542) BP: 103/71 (09/26 0542) Pulse Rate: 90 (09/26 0542)  Labs: Recent Labs    07/07/20 1315 07/08/20 0542  HGB 9.8* 8.8*  HCT 30.4* 28.1*  PLT 319 307  APTT  --  38*  LABPROT 13.7 14.1  INR 1.1 1.1  CREATININE 1.33* 1.15    Estimated Creatinine Clearance: 61.5 mL/min (by C-G formula based on SCr of 1.15 mg/dL).   Medical History: Past Medical History:  Diagnosis Date  . Alcohol abuse with alcohol-induced mood disorder ([place]) 08/18/2015  . Automatic implantable cardioverter-defibrillator in situ 2012   S/P St Jude Dual Chamber. ICD.  Marland Kitchen CAD (coronary artery disease) 11/16/2014   CAD Coronary angiography (12/2008) with mid LAD totally occluded and collateralized.  . Cardiac LV ejection fraction 10-20%   . Chest pain 09/04/2018  . CHF (congestive heart failure) ([place])   . Chronic systolic heart failure (HCC)    a) Mixed ICM/NICM b) RHC (05/2014): RA 2, RV 19/2/3, PA 22/14 (18), PCWP 6, Fick CO/CI: 5.2 / 2.7, PVR 2.3 WU, PA 60% and 64% c) ECHO (05/2014): EF 20-25%, diff HK, akinesis entireanteroseptal myocardium, triv AI, mod MR, LA mod/sev dilated  . Cocaine abuse ([place]) 01/24/2015  . Cocaine abuse with cocaine-induced mood disorder ([place]) 08/20/2015  . Drug abuse and dependence ([place])   . Dysphagia following cerebral infarction 01/24/2015  . Embolic stroke involving right middle cerebral artery ([place])   . Extensive stage primary small cell carcinoma of lung ([place]) 01/04/2020  . H/O noncompliance with medical treatment, presenting hazards to health 01/24/2015  . Heart murmur   . High cholesterol   . Ischemic cardiomyopathy    a) Coronary  angiography (12/2008) at [facility]: Lmain: nl, LAD mid 100% stenosis with left to left and right-to-left collaterals to the distal LAD; Lcx: nl, RCA nl.    . 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[place])   . MDD (major depressive disorder), recurrent severe, without psychosis ([place]) 08/18/2015  . Open wound of foot 02/27/2015  . Pneumonia 1988  . Psychoactive substance-induced mood disorder ([place]) 07/17/2015  . Sleep apnea    "cleared after T&A"  . Status post PICC central line placement   . Stroke ([place])   . Substance induced mood disorder ([place]) 08/17/2015  . Type II diabetes mellitus (HCC)     Medications:  Scheduled:  .  stroke: mapping our early stages of recovery book   Does not apply Once  . atorvastatin  20 mg Oral Daily  . carvedilol  6.25 mg Oral QPM  . insulin aspart  0-15 Units Subcutaneous TID AC & HS  . insulin glargine  36 Units Subcutaneous Q2200  . sacubitril-valsartan  1 tablet Oral QPM  . spironolactone  25 mg Oral Daily  . Warfarin - Pharmacist Dosing Inpatient   Does not apply q1600    Assessment: Pharmacy is consulted to dose warfarin in 59 yo male with advanced cardiomyopathy.   Per med rec and chart records on visit on 9/3, pt is on Warfarin 7.5 mg On Tues/Thur/Sat and 3.75 mg on all other days   Today, 07/08/20   Baseline INR is 1.1, subtherapeutic, questionable compliance - 7.5mg  warfarin given early this AM at [phone]  Hgb 8.8, plt 3107  No new drug interacting meds started    Goal of Therapy:  INR 2-3 Monitor platelets by anticoagulation protocol: Yes   Plan:   Repeat warfarin 7.5mg  today at 1600  Daily INR   Monitor for signs and symptoms of bleeding   Adrian Saran, PharmD, BCPS 07/08/2020 10:44 AM

## 2020-07-08 NOTE — Progress Notes (Addendum)
59 year old gentleman prior history of previous right MCA stroke, metastatic small lung cancer with mets to brain s/p whole brain irradiation, ongoing cocaine abuse, chronic systolic congestive heart failure with left ventricular ejection fraction of 20 to 25% on echo done on March 2021, s/p ICD placement, insulin-dependent diabetes mellitus, remote history of alcohol abuse presents to ED for unsteady gait, slurred speech and left upper extremity tingling.  Initial CT of the head negative.  MRI could not be done due to ICD.  Repeat CT of the head with contrast does not show any new stroke.  Patient seen and examined this morning. Patient was admitted earlier this morning by Dr. Cyd Silence please see his note for detailed H&P.   Patient reports he has persistent slurring of speech but better than yesterday. Further work-up for evaluation of stroke vs seizures in progress.  Neurology will be consulted. He was started on aspirin and rest of his home medications were continued.  Patient was on Coumadin and his INR on admission this morning was 1.1 suspect probably secondary to noncompliance.    Hosie Poisson, MD

## 2020-07-09 ENCOUNTER — Observation Stay (HOSPITAL_COMMUNITY): Payer: Medicare Other

## 2020-07-09 ENCOUNTER — Inpatient Hospital Stay (HOSPITAL_COMMUNITY)
Admit: 2020-07-09 | Discharge: 2020-07-09 | Disposition: A | Discharge status: Home / Self Care | Payer: Medicare Other | Attending: Internal Medicine | Admitting: Internal Medicine

## 2020-07-09 DIAGNOSIS — G9341 Metabolic encephalopathy: Secondary | ICD-10-CM | POA: Diagnosis present

## 2020-07-09 DIAGNOSIS — Z7189 Other specified counseling: Secondary | ICD-10-CM | POA: Diagnosis not present

## 2020-07-09 DIAGNOSIS — F1410 Cocaine abuse, uncomplicated: Secondary | ICD-10-CM | POA: Diagnosis not present

## 2020-07-09 DIAGNOSIS — I5042 Chronic combined systolic (congestive) and diastolic (congestive) heart failure: Secondary | ICD-10-CM | POA: Diagnosis present

## 2020-07-09 DIAGNOSIS — D638 Anemia in other chronic diseases classified elsewhere: Secondary | ICD-10-CM | POA: Diagnosis present

## 2020-07-09 DIAGNOSIS — Z681 Body mass index (BMI) 19 or less, adult: Secondary | ICD-10-CM | POA: Diagnosis not present

## 2020-07-09 DIAGNOSIS — Z515 Encounter for palliative care: Secondary | ICD-10-CM | POA: Diagnosis not present

## 2020-07-09 DIAGNOSIS — Z95810 Presence of automatic (implantable) cardiac defibrillator: Secondary | ICD-10-CM | POA: Diagnosis not present

## 2020-07-09 DIAGNOSIS — I951 Orthostatic hypotension: Secondary | ICD-10-CM | POA: Diagnosis present

## 2020-07-09 DIAGNOSIS — Z5181 Encounter for therapeutic drug level monitoring: Secondary | ICD-10-CM | POA: Diagnosis not present

## 2020-07-09 DIAGNOSIS — I10 Essential (primary) hypertension: Secondary | ICD-10-CM | POA: Diagnosis not present

## 2020-07-09 DIAGNOSIS — Z7901 Long term (current) use of anticoagulants: Secondary | ICD-10-CM | POA: Diagnosis not present

## 2020-07-09 DIAGNOSIS — R4781 Slurred speech: Secondary | ICD-10-CM | POA: Diagnosis not present

## 2020-07-09 DIAGNOSIS — I2510 Atherosclerotic heart disease of native coronary artery without angina pectoris: Secondary | ICD-10-CM | POA: Diagnosis present

## 2020-07-09 DIAGNOSIS — I5022 Chronic systolic (congestive) heart failure: Secondary | ICD-10-CM

## 2020-07-09 DIAGNOSIS — E1169 Type 2 diabetes mellitus with other specified complication: Secondary | ICD-10-CM | POA: Diagnosis present

## 2020-07-09 DIAGNOSIS — E440 Moderate protein-calorie malnutrition: Secondary | ICD-10-CM | POA: Diagnosis present

## 2020-07-09 DIAGNOSIS — E785 Hyperlipidemia, unspecified: Secondary | ICD-10-CM | POA: Diagnosis present

## 2020-07-09 DIAGNOSIS — R55 Syncope and collapse: Secondary | ICD-10-CM | POA: Diagnosis present

## 2020-07-09 DIAGNOSIS — I313 Pericardial effusion (noninflammatory): Secondary | ICD-10-CM | POA: Diagnosis present

## 2020-07-09 DIAGNOSIS — Z794 Long term (current) use of insulin: Secondary | ICD-10-CM | POA: Diagnosis not present

## 2020-07-09 DIAGNOSIS — I361 Nonrheumatic tricuspid (valve) insufficiency: Secondary | ICD-10-CM | POA: Diagnosis not present

## 2020-07-09 DIAGNOSIS — I69354 Hemiplegia and hemiparesis following cerebral infarction affecting left non-dominant side: Secondary | ICD-10-CM | POA: Diagnosis not present

## 2020-07-09 DIAGNOSIS — R2681 Unsteadiness on feet: Secondary | ICD-10-CM | POA: Diagnosis present

## 2020-07-09 DIAGNOSIS — C3490 Malignant neoplasm of unspecified part of unspecified bronchus or lung: Secondary | ICD-10-CM | POA: Diagnosis not present

## 2020-07-09 DIAGNOSIS — C7931 Secondary malignant neoplasm of brain: Secondary | ICD-10-CM | POA: Diagnosis present

## 2020-07-09 DIAGNOSIS — F1490 Cocaine use, unspecified, uncomplicated: Secondary | ICD-10-CM | POA: Diagnosis not present

## 2020-07-09 DIAGNOSIS — R471 Dysarthria and anarthria: Secondary | ICD-10-CM | POA: Diagnosis present

## 2020-07-09 DIAGNOSIS — I340 Nonrheumatic mitral (valve) insufficiency: Secondary | ICD-10-CM

## 2020-07-09 DIAGNOSIS — R4701 Aphasia: Secondary | ICD-10-CM | POA: Diagnosis present

## 2020-07-09 DIAGNOSIS — E7800 Pure hypercholesterolemia, unspecified: Secondary | ICD-10-CM | POA: Diagnosis present

## 2020-07-09 DIAGNOSIS — C3411 Malignant neoplasm of upper lobe, right bronchus or lung: Secondary | ICD-10-CM | POA: Diagnosis present

## 2020-07-09 DIAGNOSIS — E1165 Type 2 diabetes mellitus with hyperglycemia: Secondary | ICD-10-CM | POA: Diagnosis present

## 2020-07-09 DIAGNOSIS — I110 Hypertensive heart disease with heart failure: Secondary | ICD-10-CM | POA: Diagnosis present

## 2020-07-09 DIAGNOSIS — I428 Other cardiomyopathies: Secondary | ICD-10-CM | POA: Diagnosis present

## 2020-07-09 DIAGNOSIS — I440 Atrioventricular block, first degree: Secondary | ICD-10-CM | POA: Diagnosis present

## 2020-07-09 DIAGNOSIS — Z20822 Contact with and (suspected) exposure to covid-19: Secondary | ICD-10-CM | POA: Diagnosis present

## 2020-07-09 DIAGNOSIS — R2990 Unspecified symptoms and signs involving the nervous system: Secondary | ICD-10-CM | POA: Diagnosis not present

## 2020-07-09 DIAGNOSIS — F17210 Nicotine dependence, cigarettes, uncomplicated: Secondary | ICD-10-CM | POA: Diagnosis present

## 2020-07-09 DIAGNOSIS — R627 Adult failure to thrive: Secondary | ICD-10-CM | POA: Diagnosis present

## 2020-07-09 LAB — CBC WITH DIFFERENTIAL/PLATELET
Abs Immature Granulocytes: 0.02 10*3/uL (ref 0.00–0.07)
Basophils Absolute: 0 10*3/uL (ref 0.0–0.1)
Basophils Relative: 0 %
Eosinophils Absolute: 0.1 10*3/uL (ref 0.0–0.5)
Eosinophils Relative: 2 %
HCT: 24.4 % — ABNORMAL LOW (ref 39.0–52.0)
Hemoglobin: 7.5 g/dL — ABNORMAL LOW (ref 13.0–17.0)
Immature Granulocytes: 0 %
Lymphocytes Relative: 19 %
Lymphs Abs: 1.2 10*3/uL (ref 0.7–4.0)
MCH: 29 pg (ref 26.0–34.0)
MCHC: 30.7 g/dL (ref 30.0–36.0)
MCV: 94.2 fL (ref 80.0–100.0)
Monocytes Absolute: 0.8 10*3/uL (ref 0.1–1.0)
Monocytes Relative: 12 %
Neutro Abs: 4.4 10*3/uL (ref 1.7–7.7)
Neutrophils Relative %: 67 %
Platelets: 323 10*3/uL (ref 150–400)
RBC: 2.59 MIL/uL — ABNORMAL LOW (ref 4.22–5.81)
RDW: 13.2 % (ref 11.5–15.5)
WBC: 6.6 10*3/uL (ref 4.0–10.5)
nRBC: 0 % (ref 0.0–0.2)

## 2020-07-09 LAB — ECHOCARDIOGRAM COMPLETE
Height: 72 in
S' Lateral: 5.4 cm
Weight: 2192 oz

## 2020-07-09 LAB — BASIC METABOLIC PANEL
Anion gap: 10 (ref 5–15)
BUN: 17 mg/dL (ref 6–20)
CO2: 26 mmol/L (ref 22–32)
Calcium: 8.7 mg/dL — ABNORMAL LOW (ref 8.9–10.3)
Chloride: 102 mmol/L (ref 98–111)
Creatinine, Ser: 1.14 mg/dL (ref 0.61–1.24)
GFR calc Af Amer: >60 mL/min (ref >60)
GFR calc non Af Amer: >60 mL/min (ref >60)
Glucose, Bld: 139 mg/dL — ABNORMAL HIGH (ref 70–99)
Potassium: 4.1 mmol/L (ref 3.5–5.1)
Sodium: 138 mmol/L (ref 135–145)

## 2020-07-09 LAB — GLUCOSE, CAPILLARY
Glucose-Capillary: 70 mg/dL (ref 70–99)
Glucose-Capillary: 143 mg/dL — ABNORMAL HIGH (ref 70–99)
Glucose-Capillary: 145 mg/dL — ABNORMAL HIGH (ref 70–99)
Glucose-Capillary: 226 mg/dL — ABNORMAL HIGH (ref 70–99)

## 2020-07-09 LAB — PROTIME-INR
INR: 1.2 (ref 0.8–1.2)
Prothrombin Time: 15.1 seconds (ref 11.4–15.2)

## 2020-07-09 MED ORDER — WARFARIN SODIUM 5 MG PO TABS
5.0000 mg | ORAL_TABLET | Freq: Once | ORAL | Status: AC
  Administered 2020-07-09: 5 mg via ORAL
  Filled 2020-07-09: qty 1

## 2020-07-09 MED ORDER — INSULIN GLARGINE 100 UNIT/ML ~~LOC~~ SOLN
30.0000 [IU] | Freq: Every day | SUBCUTANEOUS | Status: DC
  Administered 2020-07-09: 30 [IU] via SUBCUTANEOUS
  Filled 2020-07-09: qty 0.3

## 2020-07-09 NOTE — Progress Notes (Signed)
EEG Completed; Results Pending  

## 2020-07-09 NOTE — Progress Notes (Signed)
ANTICOAGULATION CONSULT NOTE -  Consult  Pharmacy Consult for Warfarin Indication: advanced (noncompaction) cardiomyopathy  No Known Allergies  Patient Measurements: Height: 6' (182.9 cm) Weight: 62.1 kg (137 lb) IBW/kg (Calculated) : 77.6  Vital Signs: Temp: 98.9 F (37.2 C) (09/27 1029) Temp Source: Oral (09/27 1029) BP: 141/99 (09/27 1103) Pulse Rate: 89 (09/27 1103)  Labs: Recent Labs    07/07/20 1315 07/07/20 1315 07/08/20 0542 07/09/20 1039  HGB 9.8*   < > 8.8* 7.5*  HCT 30.4*  --  28.1* 24.4*  PLT 319  --  307 323  APTT  --   --  38*  --   LABPROT 13.7  --  14.1  --   INR 1.1  --  1.1  --   CREATININE 1.33*  --  1.15 1.14   < > = values in this interval not displayed.    Estimated Creatinine Clearance: 62 mL/min (by C-G formula based on SCr of 1.14 mg/dL).  Medications:  Scheduled:  . aspirin EC  81 mg Oral Daily  . atorvastatin  20 mg Oral Daily  . insulin aspart  0-15 Units Subcutaneous TID AC & HS  . insulin glargine  36 Units Subcutaneous Q2200  . 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 Per med rec and chart records on visit on 9/3, pt is on Warfarin 7.5 mg On Tues/Thur/Sat and 3.75 mg on all other days. Baseline INR subtherapeutic on admission; possible noncompliance.  Today, 07/09/20   INR remains low, up slightly from yesterday  Hgb low and continues to drop but no signs of bleeding noted per RN or MD; Plt stable WNL  No major drug-drug interactions with warfarin; continues on concomitant ASA from PTA  Eating 100% of meals per RN  Goal of Therapy:  INR 2-3 Monitor platelets by anticoagulation protocol: Yes   Plan:   Warfarin 5 mg PO x1 tonight  Daily INR   Monitor for signs and symptoms of bleeding  Reuel Boom, PharmD, BCPS [phone] 07/09/2020, 11:31 AM

## 2020-07-09 NOTE — Procedures (Signed)
Patient Name: Dennis [name].  MRN: [id number]  Epilepsy Attending: Lora Havens  Referring Physician/Provider: Dr. Hosie Poisson Date: 07/09/2020 Duration: 23.22 mins  Patient history: 59 year old male with previous right MCA stroke, metastatic small lung cancer with mets to brain status post whole brain radiation, ongoing cocaine abuse who had an episode of transient speech disturbance.  EEG to evaluate for seizures.  Level of alertness: Awake, assleep  AEDs during EEG study: None  Technical aspects: This EEG study was done with scalp electrodes positioned according to the 10-20 International system of electrode placement. Electrical activity was acquired at a sampling rate of 500Hz  and reviewed with a high frequency filter of 70Hz  and a low frequency filter of 1Hz . EEG data were recorded continuously and digitally stored.   Description: The posterior dominant rhythm consists of 9 Hz activity of moderate voltage (25-35 uV) seen predominantly in posterior head regions, symmetric and reactive to eye opening and eye closing.  Sleep was characterized by vertex waves, sleep spindles (12 to 14 Hz), maximal frontocentral region.  Hyperventilation and photic stimulation were not performed.     IMPRESSION: This study is within normal limits. No seizures or epileptiform discharges were seen throughout the recording.  Avalie Oconnor Barbra Sarks

## 2020-07-09 NOTE — Plan of Care (Signed)
  Problem: Coping: Goal: Level of anxiety will decrease Outcome: Progressing   Problem: Pain Managment: Goal: General experience of comfort will improve Outcome: Progressing   

## 2020-07-09 NOTE — Progress Notes (Signed)
This afternoon patient became tearful and upset.  He stated he didn't know why he was crying.  Brother was present at time and noted this was not normal behavior for the patient.

## 2020-07-09 NOTE — Progress Notes (Signed)
  Echocardiogram 2D Echocardiogram has been performed.  Jennette Dubin 07/09/2020, 8:57 AM

## 2020-07-09 NOTE — Progress Notes (Signed)
PROGRESS NOTE    Dennis [name].  ZCH:[id number] DOB: Dec 20, 1960 DOA: 07/07/2020 PCP: [name], MD    Chief Complaint  Patient presents with  . 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  Initial CT of the head negative.  MRI could not be done due to ICD.  Repeat CT of the head with contrast does not show any new stroke. Discussed with Dr Rory Percy , recommended to get EEG and further evaluation of his recurrent syncope. Int he last 24 hours, pt had a syncopal episode prior to urinating, lasting for 10 seconds. He was also found to be orthostatic, . He was started on gentle hydration and his BP meds have been held.  Pt seen and examined at bedside, reports his speech is more clear today.   Assessment & Plan:   Principal Problem:   Unsteady gait when walking Active Problems:   Long term (current) use of anticoagulants   Automatic implantable cardioverter-defibrillator in situ   Chronic systolic heart failure (HCC)   Hyperlipidemia associated with type 2 diabetes mellitus (HCC)   Cocaine abuse (HCC)   Essential hypertension   Small cell lung cancer, right upper lobe ([place])   Slurred speech   Uncontrolled type 2 diabetes mellitus with hyperglycemia, with long-term current use of insulin (HCC)   Syncope    Slurred speech , right arm tingling and numbness and unsteady gait in the setting of prior CVA. -His symptoms have improved and his CT of the head without contrast and with contrast have been negative for acute stroke. -MRI could not be done  due to his ICD which was placed in 2012 probably not compatible to MRI. -His ICD was placed at [facility] and he does not have any paperwork. Echocardiogram showed left ventricular ejection fraction improved to 25 to 30% when compared to 20% of last echocardiogram. -No  Arrhythmias on telemetry. -Patient is noncompliant to medications. He was started on aspirin and resumed his Coumadin.  His INR remains subtherapeutic   Substance abuse/recurrent cocaine abuse Counseling given, TOC was consulted for resources     Chronic systolic heart failure s/p AICD Patient reports noncompliance to medications.  He was on Entresto, Coreg, spironolactone which have all been held for orthostatic hypotension.     Recurrent syncope Possibly vasovagal versus orthostatic hypotension.  No arrhythmias on telemetry. Echocardiogram reviewed, cardiology consulted for further evaluation and to see if the ICD needs to be interrogated.   Essential hypertension Blood pressure parameters are borderline.   Uncontrolled diabetes mellitus with hyperglycemia CBG (last 3)  Recent Labs    07/08/20 2216 07/09/20 0737 07/09/20 1205  GLUCAP 211* 70 143*   Resume sliding scale insulin And decreased lantus to 30 units daily.    Hyperlipidemia Continue with Lipitor 20 mg daily.   DVT prophylaxis: Coumadin Code Status: Full code family Communication: (None at bedside Disposition:   Status is: Inpatient  Remains inpatient appropriate because:Ongoing diagnostic testing needed not appropriate for outpatient work up   Dispo: The patient is from: Home  Anticipated d/c is to: Home              Anticipated d/c date is: 1 day              Patient currently is not medically stable to d/c.       Consultants:   Cardiology  Neurology    Procedures:EEG Echocardiogram.   Antimicrobials: None.   Subjective: Reports not feeling dizzy, left arm tingling persistent, speech  is not almost back to normal.   Objective: Vitals:   07/09/20 1029 07/09/20 1100 07/09/20 1103 07/09/20 1500  BP: 114/80 106/73 (!) 141/99 137/89  Pulse: 86 88 89 88  Resp: 16   16  Temp: 98.9 F (37.2 C)   98.7 F (37.1 C)  TempSrc: Oral   Oral  SpO2: 96%   97%  Weight:      Height:        Intake/Output Summary (Last 24 hours) at 07/09/2020 1545 Last data filed at 07/09/2020 1256 Gross per 24 hour  Intake 1750.93 ml  Output 725 ml  Net 1025.93 ml   Filed Weights   07/08/20 0337  Weight: 62.1 kg    Examination:  General exam: Appears calm and comfortable  Respiratory system: Clear to auscultation. Respiratory effort normal. Cardiovascular system: S1 & S2 heard, RRR. No JVD. No pedal edema. Gastrointestinal system: Abdomen is nondistended, soft and nontender.. Normal bowel sounds heard. Central nervous system: Alert and oriented. No focal neurological deficits. Extremities: Symmetric 5 x 5 power. Skin: No rashes, lesions or ulcers Psychiatry: Mood & affect appropriate.     Data Reviewed: I have personally reviewed following labs and imaging studies  CBC: Recent Labs  Lab 07/07/20 1315 07/08/20 0542 07/09/20 1039  WBC 7.4 6.5 6.6  NEUTROABS  --   --  4.4  HGB 9.8* 8.8* 7.5*  HCT 30.4* 28.1* 24.4*  MCV 89.4 91.2 94.2  PLT 319 307 371    Basic Metabolic Panel: Recent Labs  Lab 07/07/20 1315 07/08/20 0542 07/09/20 1039  NA 136 133* 138  K 4.6 3.6 4.1  CL 95* 99 102  CO2 28 24 26   GLUCOSE 188* 147* 139*  BUN 23* 21* 17  CREATININE 1.33* 1.15 1.14  CALCIUM 9.7 8.8* 8.7*    GFR: Estimated Creatinine Clearance: 62 mL/min (by C-G formula based on SCr of 1.14 mg/dL).  Liver Function Tests: Recent Labs  Lab 07/07/20 1315 07/08/20 0542  AST 20 18  ALT 17 16  ALKPHOS 88 83  BILITOT 0.8 0.7  PROT 7.4 6.8  ALBUMIN 3.2* 3.0*    CBG: Recent Labs  Lab 07/08/20 1414 07/08/20 1606 07/08/20 2216 07/09/20 0737 07/09/20 1205  GLUCAP 74 196* 211*  70 143*     Recent Results (from the past 240 hour(s))  Respiratory Panel by RT PCR (Flu A&B, Covid) - Nasopharyngeal Swab     Status: None   Collection Time: 07/07/20  9:15 PM   Specimen: Nasopharyngeal Swab  Result Value Ref Range Status   SARS Coronavirus 2 by RT PCR NEGATIVE NEGATIVE Final    Comment: (NOTE) SARS-CoV-2 target nucleic acids are NOT DETECTED.  The SARS-CoV-2 RNA is generally detectable in upper respiratoy specimens during the acute phase of infection. The lowest concentration of SARS-CoV-2 viral copies this assay can detect is 131 copies/mL. A negative result does not preclude SARS-Cov-2 infection and should not be used as the sole basis for treatment or other patient management decisions. A negative result may occur with  improper specimen collection/handling, submission of specimen other than nasopharyngeal swab, presence of viral mutation(s) within the areas targeted by this assay, and inadequate number of viral copies (<131 copies/mL). A negative result must be combined with clinical observations, patient history, and epidemiological information. The expected result is Negative.  Fact Sheet for Patients:  PinkCheek.be  Fact Sheet for Healthcare Providers:  GravelBags.it  This test is no t yet approved or cleared by the [place] FDA and  has been authorized for detection and/or diagnosis of SARS-CoV-2 by FDA under an Emergency Use Authorization (EUA). This EUA will remain  in effect (meaning this test can be used) for the duration of the COVID-19 declaration under Section 564(b)(1) of the Act, 21 U.S.C. section 360bbb-3(b)(1), unless the authorization is terminated or revoked sooner.     Influenza A by PCR NEGATIVE NEGATIVE Final   Influenza B by PCR NEGATIVE NEGATIVE Final    Comment: (NOTE) The Xpert Xpress SARS-CoV-2/FLU/RSV assay is intended as an aid in  the diagnosis of influenza  from Nasopharyngeal swab specimens and  should not be used as a sole basis for treatment. Nasal washings and  aspirates are unacceptable for Xpert Xpress SARS-CoV-2/FLU/RSV  testing.  Fact Sheet for Patients: PinkCheek.be  Fact Sheet for Healthcare Providers: GravelBags.it  This test is not yet approved or cleared by the [place] FDA and  has been authorized for detection and/or diagnosis of SARS-CoV-2 by  FDA under an Emergency Use Authorization (EUA). This EUA will remain  in effect (meaning this test can be used) for the duration of the  Covid-19 declaration under Section 564(b)(1) of the Act, 21  U.S.C. section 360bbb-3(b)(1), unless the authorization is  terminated or revoked. Performed at [facility], [place] [address] [place], [place] [address]          Radiology Studies: CT HEAD W CONTRAST  Result Date: 07/07/2020 CLINICAL DATA:  Neuro deficit EXAM: CT HEAD WITH CONTRAST TECHNIQUE: Contiguous axial images were obtained from the base of the skull through the vertex with intravenous contrast. CONTRAST:  174mL OMNIPAQUE IOHEXOL 300 MG/ML  SOLN COMPARISON:  07/07/2020 head CT.  06/06/2020 head CT and prior. FINDINGS: Brain: Sequela of prior right frontal insult. No acute infarct. No intracranial hemorrhage. No abnormal enhancement or mass lesion. Mild cerebral atrophy with ex vacuo dilatation. No midline shift, ventriculomegaly or extra-axial fluid collection. Vascular: Normal intravascular enhancement. Skull: No acute finding. Sinuses/Orbits: No acute finding. Mild ethmoid sinus mucosal thickening. Other: None. IMPRESSION: No acute intracranial process.  No abnormal enhancement. Sequela of prior right frontal insult, unchanged. Electronically Signed   By: Primitivo Gauze M.D.   On: 07/07/2020 19:43   EEG adult  Result Date: 07/09/2020 Lora Havens, MD     07/09/2020  3:39 PM Patient Name:  Oreoluwa Aigner. MRN: [id number] Epilepsy Attending: Lora Havens Referring Physician/Provider: Dr. Hosie Poisson Date: 07/09/2020 Duration: 23.22 mins Patient history: 59 year old male with previous right MCA stroke, metastatic small lung cancer with mets to brain status post whole brain radiation, ongoing cocaine abuse who had an episode of transient speech disturbance.  EEG to evaluate for seizures. Level of alertness: Awake, assleep AEDs during EEG study: None Technical aspects: This EEG study was done with scalp electrodes positioned according to the 10-20 International system of electrode placement. Electrical activity was acquired at a sampling rate of 500Hz  and reviewed with a high frequency filter of 70Hz  and a low frequency filter of 1Hz . EEG data were recorded continuously  and digitally stored. Description: The posterior dominant rhythm consists of 9 Hz activity of moderate voltage (25-35 uV) seen predominantly in posterior head regions, symmetric and reactive to eye opening and eye closing.  Sleep was characterized by vertex waves, sleep spindles (12 to 14 Hz), maximal frontocentral region.  Hyperventilation and photic stimulation were not performed.   IMPRESSION: This study is within normal limits. No seizures or epileptiform discharges were seen throughout the recording. Lora Havens   ECHOCARDIOGRAM COMPLETE  Result Date: 07/09/2020    ECHOCARDIOGRAM REPORT   Patient Name:   Espen Bethel. Date of Exam: 07/09/2020 Medical Rec #:  [id number]       Height:       72.0 in Accession #:    [id number]      Weight:       137.0 lb Date of Birth:  1961/09/23        BSA:          1.814 m Patient Age:    61 years        BP:           100/65 mmHg Patient Gender: M               HR:           94 bpm. Exam Location:  Inpatient Procedure: 2D Echo Indications:    TIA G45.9  History:        Patient has prior history of Echocardiogram examinations, most                 recent 12/25/2019. CHF and Ischemic  Cardiomyopathy, CAD,                 Defibrillator; Risk Factors:Diabetes, Drug and Alcohol abuse and                 Dyslipidemia.  Sonographer:    Mikki Santee RDCS (AE) Referring Phys: [place]  1. Since the last exam on 12/25/2019 LVEF has slightly improved from 15-20% to 25-30% with diffuse hypokinesis.  2. Left ventricular ejection fraction, by estimation, is 25 to 30%. The left ventricle has severely decreased function. The left ventricle demonstrates global hypokinesis. The left ventricular internal cavity size was moderately dilated. There is mild concentric left ventricular hypertrophy. Left ventricular diastolic parameters are consistent with Grade II diastolic dysfunction (pseudonormalization). Elevated left atrial pressure.  3. Right ventricular systolic function is normal. The right ventricular size is normal. There is normal pulmonary artery systolic pressure.  4. A small pericardial effusion is present. The pericardial effusion is posterior to the left ventricle.  5. The mitral valve is normal in structure. Mild mitral valve regurgitation. No evidence of mitral stenosis.  6. The aortic valve is normal in structure. Aortic valve regurgitation is trivial. No aortic stenosis is present.  7. The inferior vena cava is normal in size with greater than 50% respiratory variability, suggesting right atrial pressure of 3 mmHg. FINDINGS  Left Ventricle: Left ventricular ejection fraction, by estimation, is 25 to 30%. The left ventricle has severely decreased function. The left ventricle demonstrates global hypokinesis. The left ventricular internal cavity size was moderately dilated. There is mild concentric left ventricular hypertrophy. Left ventricular diastolic parameters are consistent with Grade II diastolic dysfunction (pseudonormalization). Elevated left atrial pressure. Right Ventricle: The right ventricular size is normal. No increase in right ventricular wall thickness. Right  ventricular systolic function is normal. There is normal pulmonary artery systolic pressure. The tricuspid  regurgitant velocity is 1.77 m/s, and  with an assumed right atrial pressure of 8 mmHg, the estimated right ventricular systolic pressure is 41.9 mmHg. Left Atrium: Left atrial size was normal in size. Right Atrium: Right atrial size was normal in size. Pericardium: A small pericardial effusion is present. The pericardial effusion is posterior to the left ventricle. Mitral Valve: The mitral valve is normal in structure. Mild mitral valve regurgitation. No evidence of mitral valve stenosis. Tricuspid Valve: The tricuspid valve is normal in structure. Tricuspid valve regurgitation is mild . No evidence of tricuspid stenosis. Aortic Valve: The aortic valve is normal in structure. Aortic valve regurgitation is trivial. No aortic stenosis is present. Pulmonic Valve: The pulmonic valve was normal in structure. Pulmonic valve regurgitation is trivial. No evidence of pulmonic stenosis. Aorta: The aortic root is normal in size and structure. Venous: The inferior vena cava is normal in size with greater than 50% respiratory variability, suggesting right atrial pressure of 3 mmHg. IAS/Shunts: No atrial level shunt detected by color flow Doppler. Additional Comments: A pacer wire is visualized in the right atrium and right ventricle.  LEFT VENTRICLE PLAX 2D LVIDd:         6.40 cm  Diastology LVIDs:         5.40 cm  LV e' medial: 9.68 cm/s LV PW:         1.20 cm LV IVS:        1.10 cm LVOT diam:     2.20 cm LV SV:         69 LV SV Index:   38 LVOT Area:     3.80 cm  RIGHT VENTRICLE RV S prime:     12.30 cm/s TAPSE (M-mode): 2.3 cm LEFT ATRIUM             Index       RIGHT ATRIUM           Index LA diam:        3.20 cm 1.76 cm/m  RA Area:     18.00 cm LA Vol (A2C):   57.9 ml 31.92 ml/m RA Volume:   46.30 ml  25.52 ml/m LA Vol (A4C):   53.6 ml 29.55 ml/m LA Biplane Vol: 61.1 ml 33.68 ml/m  AORTIC VALVE LVOT Vmax:    111.67 cm/s LVOT Vmean:  82.300 cm/s LVOT VTI:    0.180 m  AORTA Ao Root diam: 3.70 cm TRICUSPID VALVE TR Peak grad:   12.5 mmHg TR Vmax:        177.00 cm/s  SHUNTS Systemic VTI:  0.18 m Systemic Diam: 2.20 cm Ena Dawley MD Electronically signed by Ena Dawley MD Signature Date/Time: 07/09/2020/12:16:56 PM    Final    VAS US CAROTID (at [facility] and [facility] only)  Result Date: 07/09/2020 Carotid Arterial Duplex Study Indications:       CVA. Risk Factors:      Hypertension, hyperlipidemia. Comparison Study:  No prior studies. Performing Technologist: Oliver Hum RVT  Examination Guidelines: A complete evaluation includes B-mode imaging, spectral Doppler, color Doppler, and power Doppler as needed of all accessible portions of each vessel. Bilateral testing is considered an integral part of a complete examination. Limited examinations for reoccurring indications may be performed as noted.  Right Carotid Findings: +----------+--------+--------+--------+-----------------------+--------+           PSV cm/sEDV cm/sStenosisPlaque Description     Comments +----------+--------+--------+--------+-----------------------+--------+ CCA Prox  110     30  smooth and heterogenous         +----------+--------+--------+--------+-----------------------+--------+ CCA Distal71      26              smooth and heterogenous         +----------+--------+--------+--------+-----------------------+--------+ ICA Prox  49      20              smooth and heterogenous         +----------+--------+--------+--------+-----------------------+--------+ ICA Distal63      27                                     tortuous +----------+--------+--------+--------+-----------------------+--------+ ECA       82      21                                              +----------+--------+--------+--------+-----------------------+--------+ +----------+--------+-------+--------+-------------------+            PSV cm/sEDV cmsDescribeArm Pressure (mmHG) +----------+--------+-------+--------+-------------------+ [id number]                                         +----------+--------+-------+--------+-------------------+ +---------+--------+--+--------+--+---------+ VertebralPSV cm/s57EDV cm/s28Antegrade +---------+--------+--+--------+--+---------+  Left Carotid Findings: +----------+--------+--------+--------+-----------------------+--------+           PSV cm/sEDV cm/sStenosisPlaque Description     Comments +----------+--------+--------+--------+-----------------------+--------+ CCA Prox  98      30              smooth and heterogenous         +----------+--------+--------+--------+-----------------------+--------+ CCA Distal56      18              smooth and heterogenous         +----------+--------+--------+--------+-----------------------+--------+ ICA Prox  48      19              smooth and heterogenous         +----------+--------+--------+--------+-----------------------+--------+ ICA Distal56      26                                     tortuous +----------+--------+--------+--------+-----------------------+--------+ ECA       40      6                                               +----------+--------+--------+--------+-----------------------+--------+ +----------+--------+--------+--------+-------------------+           PSV cm/sEDV cm/sDescribeArm Pressure (mmHG) +----------+--------+--------+--------+-------------------+ [id number]                                          +----------+--------+--------+--------+-------------------+ +---------+--------+--+--------+--+---------+ VertebralPSV cm/s43EDV cm/s20Antegrade +---------+--------+--+--------+--+---------+   Summary: Right Carotid: Velocities in the right ICA are consistent with a 1-39% stenosis. Left Carotid: Velocities in the left ICA are consistent with a 1-39% stenosis.  Vertebrals: Bilateral vertebral arteries demonstrate antegrade flow. *See table(s) above for measurements and observations.  Electronically signed by Mamie Nick  Sethi MD on 07/09/2020 at 12:52:15 PM.    Final         Scheduled Meds: . aspirin EC  81 mg Oral Daily  . atorvastatin  20 mg Oral Daily  . insulin aspart  0-15 Units Subcutaneous TID AC & HS  . insulin glargine  36 Units Subcutaneous Q2200  . warfarin  5 mg Oral ONCE-1600  . Warfarin - Pharmacist Dosing Inpatient   Does not apply q1600   Continuous Infusions: . sodium chloride 50 mL/hr at 07/09/20 1419     LOS: 0 days        Hosie Poisson, MD [facility]   To contact the attending provider between 7A-7P or the covering provider during after hours 7P-7A, please log into the web site [email or web address] and access using universal [place] password for that web site. If you do not have the password, please call the hospital operator.  07/09/2020, 3:45 PM

## 2020-07-09 NOTE — Consult Note (Addendum)
Cardiology Consultation:   Patient ID: Dennis [name]. MRN: [id number]; DOB: [birth year]  Admit date: 07/07/2020 Date of Consult: 07/09/2020  Primary Care Provider: [name], MD [facility] Cardiologist: Glori Bickers, MD  [facility] Electrophysiologist:  None    Patient Profile:   Dennis [name]. is a 59 y.o. male with a history CAD with totally occluded LAD with collaterals in 12/2019 and negative stress test in 09/2018, mixed ischemic/non-ischemic cardiomyopathy and chronic systolic CHF with EF of 38-45% due to ventricular non-compaction on chronic Coumadin, s/p St. Jude dual chamber ICD, stroke in 4/ 2016, hyperlipidemia, type 2 diabetes mellitus, former polysubstance abuse (tobacco, cocaine, and alcohol), and non-compliance who is being seen today for the evaluation of recurrent syncope at the request of Dr. Karleen Hampshire.  History of Present Illness:   [name] is a 59 year old male with the above history who is followed by Dr. Haroldine Laws.   Patient has history of CAD. Remote cardiac cath in 2010 showed occluded LAD with collaterals. He was admitted in 01/2015 with acute CVA with left sided weakness. CT showed right MCA stroke. He did not receive tPA due to delay of arrival. Echo showed LVEF of 10-15% with mild RV hypokinesis. He was discharged to in-patient rehab but then developed hypotension so was transferred back to HF floor. He ultimately underwent RHC which showed mildly elevated filling pressures. He was diuresed gentle but medications were limited by hypotension with systolic BP in the 36'I. Troponin remained negative. Myoview in 09/2020 showed prior infarct but no ischemia. This was considered high due to low EF. CPX in 12/2018 showed moderate heart failure limitation. He was last seen by Dr. Haroldine Laws in 07/2019 at which time he was doing relatively well from a cardiac standpoint. Coreg was increased and Jardiance was added. He was continued on Entresto and Spironolactone.    Since last being seen in our office, he was admitted in 12/2019 with several days of left-sided weakness and slurred speech and was found to have a 67mm right frontal mass with vasogenic edema dn chronic right insular and opercular infarct. CT of chest also showed posterior right middle lobe pulmonary nodule suspicious for primary bronchogenic carcinoma. Biopsies ultimately showed small cell lung cancer with multiple metastatic brain lesion. He now follows with Dr. Julien Nordmann as outpatient and is undergoing chemotherapy with Carboplatin, Etoposide, and Imfinzi. He has also completed whole brain radiation.   Patient presented to the ED on 07/07/2020 for further evaluation of aphasia. Head CT showed no new strokes. He was admitted for further evaluation and Neurology consulted. Cardiology was consulted today for recurrent syncope. Spoke with RN. Patient reportedly had 2 syncopal episodes over the weekend. Don't see any documentation from first event. There is a note on 07/08/2020 at 12:41pm that patient was standing at side of bed attempting to voice when he lost his balance and laid back down in bed. NT present at that time. No documentation of loss of consciousness. There is another note by RN on 07/08/2020 at 3:03pm that patient was standing at bedside with PT when he fell back onto bed and became unresponsive for 10-15 seconds. He was noted be diaphoretic and lethargic. CBG 74 at the time. Manuel BP 110/76. PT note mentions that he requested to use urinal prior to this event. Sounds like this may be micturition syncope. No concerning arrhythmias on telemetry at the time.  At the time of this evaluation, patient resting comfortably. He states he developed slurred speech last Tuesday (9/21)  that progressively worsened and by Friday (9/24) he said he could not speak which is why he decided to come to the ED. No other stroke symptoms - no headache, unilateral weakness, facial droop. He does note multiple syncopal  episodes in the last 2 months - about 1 episode every week. He states these episodes occur when standing. He reports feeling lightheaded and dizzy prior to these episodes but denies any palpitations or chest pain prior to these events. He notes occasional feeling of heart racing when he is up moving. No chest pain, shortness of breath, orthopnea, PND, or edema. He notes a non-productive cough but no recent fevers or illnesses. No abnormal bleeding in urine or stools.   Past Medical History:  Diagnosis Date  . Alcohol abuse with alcohol-induced mood disorder ([place]) 08/18/2015  . Automatic implantable cardioverter-defibrillator in situ 2012   S/P St Jude Dual Chamber. ICD.  Marland Kitchen CAD (coronary artery disease) 11/16/2014   CAD Coronary angiography (12/2008) with mid LAD totally occluded and collateralized.  . Cardiac LV ejection fraction 10-20%   . Chest pain 09/04/2018  . CHF (congestive heart failure) ([place])   . Chronic systolic heart failure (HCC)    a) Mixed ICM/NICM b) RHC (05/2014): RA 2, RV 19/2/3, PA 22/14 (18), PCWP 6, Fick CO/CI: 5.2 / 2.7, PVR 2.3 WU, PA 60% and 64% c) ECHO (05/2014): EF 20-25%, diff HK, akinesis entireanteroseptal myocardium, triv AI, mod MR, LA mod/sev dilated  . Cocaine abuse ([place]) 01/24/2015  . Cocaine abuse with cocaine-induced mood disorder ([place]) 08/20/2015  . Drug abuse and dependence ([place])   . Dysphagia following cerebral infarction 01/24/2015  . Embolic stroke involving right middle cerebral artery ([place])   . Extensive stage primary small cell carcinoma of lung ([place]) 01/04/2020  . H/O noncompliance with medical treatment, presenting hazards to health 01/24/2015  . Heart murmur   . High cholesterol   . Ischemic cardiomyopathy    a) Coronary angiography (12/2008) at [facility]: Lmain: nl, LAD mid 100% stenosis with left to left and right-to-left collaterals to the distal LAD; Lcx: nl, RCA nl.    . 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[place])   . MDD (major depressive disorder),  recurrent severe, without psychosis ([place]) 08/18/2015  . Open wound of foot 02/27/2015  . Pneumonia 1988  . Psychoactive substance-induced mood disorder ([place]) 07/17/2015  . Sleep apnea    "cleared after T&A"  . Status post PICC central line placement   . Stroke ([place])   . Substance induced mood disorder ([place]) 08/17/2015  . Type II diabetes mellitus ([place])     Past Surgical History:  Procedure Laterality Date  . BRONCHIAL BRUSHINGS  12/28/2019   Procedure: BRONCHIAL BRUSHINGS;  Surgeon: Garner Nash, DO;  Location: [place] [facility];  Service: Thoracic;;  . BRONCHIAL NEEDLE ASPIRATION BIOPSY  12/28/2019   Procedure: BRONCHIAL NEEDLE ASPIRATION BIOPSIES;  Surgeon: Garner Nash, DO;  Location: [place];  Service: Thoracic;;  . BRONCHIAL WASHINGS  12/28/2019   Procedure: BRONCHIAL WASHINGS;  Surgeon: Garner Nash, DO;  Location: [place];  Service: Thoracic;;  . CARDIAC CATHETERIZATION  05/2014  . CARDIAC DEFIBRILLATOR PLACEMENT  03/2011  . CHOLECYSTECTOMY  1994  . FOREARM FRACTURE SURGERY Left 1980  . FRACTURE SURGERY    . RIGHT HEART CATHETERIZATION N/A 05/30/2014   Procedure: RIGHT HEART CATH;  Surgeon: Jolaine Artist, MD;  Location[facility] C[facility] LAB;  Service: Cardiovascular;  Laterality: N/A;  . RIGHT HEART CATHETERIZATION N/A 02/07/2015   Procedure: RIGHT HEART CATH;  Surgeon: Jolaine Artist, MD;  Location[facility] C[facility] LAB;  Service: Cardiovascular;  Laterality: N/A;  . TONSILLECTOMY AND ADENOIDECTOMY  ~ 1998  . VIDEO BRONCHOSCOPY WITH ENDOBRONCHIAL ULTRASOUND N/A 12/28/2019   Procedure: VIDEO BRONCHOSCOPY WITH ENDOBRONCHIAL ULTRASOUND;  Surgeon: Garner Nash, DO;  Location: [facility] ENDOSCOPY;  Service: Thoracic;  Laterality: N/A;     Home Medications:  Prior to Admission medications   Medication Sig Start Date End Date Taking? Authorizing Provider  atorvastatin (LIPITOR) 20 MG tablet TAKE 1 TABLET BY MOUTH DAILY AT 6 PM. Patient taking differently: Take 20 mg by mouth daily at  6 PM.  03/11/17  Yes Bensimhon, Shaune Pascal, MD  carvedilol (COREG) 6.25 MG tablet Take 1.5 tablets (9.375 mg total) by mouth 2 (two) times daily with a meal. Patient taking differently: Take 6.25 mg by mouth every evening.  08/05/19  Yes Bensimhon, Shaune Pascal, MD  Insulin Glargine (LANTUS SOLOSTAR) 100 UNIT/ML Solostar Pen Inject 36 Units into the skin daily at 10 pm.    Yes [provider]  metFORMIN (GLUCOPHAGE-XR) 500 MG 24 hr tablet Take 1,000 mg by mouth daily with breakfast.    Yes Patel, Nilay, DO  sacubitril-valsartan (ENTRESTO) 24-26 MG Take 1 tablet by mouth 2 (two) times daily. Patient taking differently: Take 2 tablets by mouth every evening.  01/24/20  Yes Bensimhon, Shaune Pascal, MD  spironolactone (ALDACTONE) 25 MG tablet Take 1 tablet (25 mg total) by mouth daily. 01/02/20  Yes Bensimhon, Shaune Pascal, MD  warfarin (COUMADIN) 7.5 MG tablet TAKE 1 TABLET BY MOUTH DAILY AT 6PM. Patient taking differently: Take 7.5 mg by mouth every evening.  01/20/20  Yes Bensimhon, Shaune Pascal, MD  Accu-Chek Softclix Lancets lancets  02/03/20   [provider]  Blood Glucose Monitoring Suppl (FIFTY50 GLUCOSE METER 2.0) w/Device KIT Use as instructed 02/03/20   [provider]  INSUPEN ULTRAFIN 31G X 8 MM [place]  03/02/20   [provider]  ondansetron (ZOFRAN) 8 MG tablet Take 1 tablet (8 mg total) by mouth every 8 (eight) hours as needed for nausea or vomiting. Patient not taking: Reported on 06/20/2020 02/21/20   Curt Bears, MD  prochlorperazine (COMPAZINE) 10 MG tablet Take 1 tablet (10 mg total) by mouth every 6 (six) hours as needed. Patient not taking: Reported on 06/20/2020 01/19/20   Heilingoetter, Cassandra L, PA-C    Inpatient Medications: Scheduled Meds: . aspirin EC  81 mg Oral Daily  . atorvastatin  20 mg Oral Daily  . insulin aspart  0-15 Units Subcutaneous TID AC & HS  . insulin glargine  36 Units Subcutaneous Q2200  . Warfarin - Pharmacist Dosing Inpatient   Does not  apply q1600   Continuous Infusions: . sodium chloride 50 mL/hr at 07/08/20 1810   PRN Meds: acetaminophen **OR** acetaminophen (TYLENOL) oral liquid 160 mg/5 mL **OR** acetaminophen, ondansetron (ZOFRAN) IV, polyethylene glycol  Allergies:   No Known Allergies  Social History:   Social History   Socioeconomic History  . Marital status: Single    Spouse name: Not on file  . Number of children: 1  . Years of education: 57  . Highest education level: Not on file  Occupational History  . Occupation: Disabled  Tobacco Use  . Smoking status: Former Smoker    Packs/day: 0.25    Years: 31.00    Pack years: 7.75    Types: Cigarettes  . Smokeless tobacco: Never Used  Vaping Use  . Vaping Use: Never used  Substance  and Sexual Activity  . Alcohol use: Not Currently    Alcohol/week: 0.0 standard drinks    Comment: 09/21/2014 "might have a drink q 6 /months, if that"  . Drug use: Yes    Types: Cocaine    Comment: last use 07/06/2020  . Sexual activity: Yes  Other Topics Concern  . Not on file  Social History Narrative   Lives in [place] with his mother. No pets. Fun: movies, sports, daughter.    Denies religious beliefs that would effect health care.    Social Determinants of Health   Financial Resource Strain:   . Difficulty of Paying Living Expenses: Not on file  Food Insecurity:   . Worried About Charity fundraiser in the Last Year: Not on file  . Ran Out of Food in the Last Year: Not on file  Transportation Needs:   . Lack of Transportation (Medical): Not on file  . Lack of Transportation (Non-Medical): Not on file  Physical Activity:   . Days of Exercise per Week: Not on file  . Minutes of Exercise per Session: Not on file  Stress:   . Feeling of Stress : Not on file  Social Connections:   . Frequency of Communication with Friends and Family: Not on file  . Frequency of Social Gatherings with Friends and Family: Not on file  . Attends Religious Services: Not on  file  . Active Member of Clubs or Organizations: Not on file  . Attends Archivist Meetings: Not on file  . Marital Status: Not on file  Intimate Partner Violence:   . Fear of Current or Ex-Partner: Not on file  . Emotionally Abused: Not on file  . Physically Abused: Not on file  . Sexually Abused: Not on file    Family History:   Family History  Problem Relation Age of Onset  . Diabetes Mother   . Heart disease Mother   . Diabetes Father   . Prostate cancer Father   . Heart disease Father   . Diabetes Brother   . Diabetes Brother      ROS:  Please see the history of present illness.  All other ROS reviewed and negative.     Physical Exam/Data:   Vitals:   07/08/20 1415 07/08/20 2017 07/08/20 2048 07/09/20 0605  BP: 110/76 (!) 137/111 116/79 100/65  Pulse:  90 92 87  Resp:  _0 Temp:  97.6 F (36.4 C) 99.6 F (37.6 C) 99.6 F (37.6 C)  TempSrc:  Oral Oral   SpO2:  100% 97% 92%  Weight:      Height:        Intake/Output Summary (Last 24 hours) at 07/09/2020 1022 Last data filed at 07/09/2020 0800 Gross per 24 hour  Intake 1699.23 ml  Output 900 ml  Net 799.23 ml   Last 3 Weights 07/08/2020 06/20/2020 05/23/2020  Weight (lbs) 137 lb 144 lb 14.4 oz 150 lb 8 oz  Weight (kg) 62.143 kg 65.726 kg 68.266 kg  Some encounter information is confidential and restricted. Go to Review Flowsheets activity to see all data.     Body mass index is 18.58 kg/m.  General: 59 y.o. male resting comfortably in no acute distress. HEENT: Normocephalic and atraumatic. Sclera clear.  Neck: Supple. No carotid bruits. No JVD. Heart: RRR. Distinct S1 and S2. No murmurs, gallops, or rubs. Radial pulses 2+ and equal bilaterally. Lungs: No increased work of breathing. Clear to ausculation bilaterally. No wheezes, rhonchi,  or rales.  Abdomen: Soft, non-distended, and non-tender to palpation. Bowel sounds present. Extremities: No lower extremity edema.    Skin: Warm and  dry. Neuro: Alert and oriented x3. No focal deficits. Psych: Normal affect. Responds appropriately.  EKG:  The EKG was personally reviewed and demonstrates:  Normal sinus rhythm, rate 87 bpm, with LVH, left axis deviation, and non-specific ST/T changes. QTc 525 ms. However, per my calculation, looks closer to 428 ms.  Telemetry:  Telemetry was personally reviewed and demonstrates:  Normal sinus rhythm with occasional PVCs. Rates in the 80's to 90's.  Relevant CV Studies:  Echocardiogram 12/25/2019: Impressions: 1. Left ventricular ejection fraction, by estimation, is 20 to 25%. The  left ventricle has severely decreased function. The left ventricle  demonstrates global hypokinesis. There is moderate asymmetric left  ventricular hypertrophy of the inferolateral  segment. Left ventricular diastolic parameters are indeterminate.  2. Right ventricular systolic function is normal. The right ventricular  size is normal. Tricuspid regurgitation signal is inadequate for assessing  PA pressure.  3. Left atrial size was moderately dilated.  4. The mitral valve is normal in structure. Mild mitral valve  regurgitation. No evidence of mitral stenosis.  5. The aortic valve is tricuspid. Aortic valve regurgitation is trivial.  No aortic stenosis is present.  6. The inferior vena cava is dilated in size with <50% respiratory  variability, suggesting right atrial pressure of 15 mmHg.   Laboratory Data:  High Sensitivity Troponin:  No results for input(s): TROPONINIHS in the last 720 hours.   Chemistry Recent Labs  Lab 07/07/20 1315 07/08/20 0542  NA 136 133*  K 4.6 3.6  CL 95* 99  CO2 28 24  GLUCOSE 188* 147*  BUN 23* 21*  CREATININE 1.33* 1.15  CALCIUM 9.7 8.8*  GFRNONAA 59* >60  GFRAA >60 >60  ANIONGAP 13 10    Recent Labs  Lab 07/07/20 1315 07/08/20 0542  PROT 7.4 6.8  ALBUMIN 3.2* 3.0*  AST 20 18  ALT 17 16  ALKPHOS 88 83  BILITOT 0.8 0.7   Hematology Recent Labs   Lab 07/07/20 1315 07/08/20 0542  WBC 7.4 6.5  RBC 3.40* 3.08*  HGB 9.8* 8.8*  HCT 30.4* 28.1*  MCV 89.4 91.2  MCH 28.8 28.6  MCHC 32.2 31.3  RDW 12.7 13.0  PLT 319 307   BNPNo results for input(s): BNP, PROBNP in the last 168 hours.  DDimer No results for input(s): DDIMER in the last 168 hours.   Radiology/Studies:  CT HEAD WO CONTRAST  Result Date: 07/07/2020 CLINICAL DATA:  Slurred speech and dizziness.  History of CVA. EXAM: CT HEAD WITHOUT CONTRAST TECHNIQUE: Contiguous axial images were obtained from the base of the skull through the vertex without intravenous contrast. COMPARISON:  June 07, 2020 FINDINGS: Brain: No evidence of acute infarction, hemorrhage, hydrocephalus, extra-axial collection or mass lesion/mass effect. Stable large area of hypoattenuation in the subcortical right frontal lobe and insula, with associated mild ex vacuo dilatation of the right lateral ventricle, sequela of prior right MCA infarct. Vascular: No hyperdense vessel or unexpected calcification. Skull: Normal. Negative for fracture or focal lesion. Sinuses/Orbits: No acute finding. Other: None. IMPRESSION: 1. No acute intracranial abnormality. 2. Stable large area of hypoattenuation in the subcortical right frontal lobe and insula, with associated mild ex vacuo dilatation of the right lateral ventricle, sequela of prior right MCA infarct. Electronically Signed   By: Ted Mcalpine M.D.   On: 07/07/2020 15:17   CT HEAD W CONTRAST  Result Date: 07/07/2020 CLINICAL DATA:  Neuro deficit EXAM: CT HEAD WITH CONTRAST TECHNIQUE: Contiguous axial images were obtained from the base of the skull through the vertex with intravenous contrast. CONTRAST:  123m OMNIPAQUE IOHEXOL 300 MG/ML  SOLN COMPARISON:  07/07/2020 head CT.  06/06/2020 head CT and prior. FINDINGS: Brain: Sequela of prior right frontal insult. No acute infarct. No intracranial hemorrhage. No abnormal enhancement or mass lesion. Mild cerebral  atrophy with ex vacuo dilatation. No midline shift, ventriculomegaly or extra-axial fluid collection. Vascular: Normal intravascular enhancement. Skull: No acute finding. Sinuses/Orbits: No acute finding. Mild ethmoid sinus mucosal thickening. Other: None. IMPRESSION: No acute intracranial process.  No abnormal enhancement. Sequela of prior right frontal insult, unchanged. Electronically Signed   By: CPrimitivo GauzeM.D.   On: 07/07/2020 19:43   DG Chest Port 1 View  Result Date: 07/07/2020 CLINICAL DATA:  Chills and weakness.  History of lung cancer. EXAM: PORTABLE CHEST 1 VIEW COMPARISON:  Chest radiograph 10/07/2019. FINDINGS: Stable multi lead pacer apparatus overlying the left hemithorax. Stable cardiac and mediastinal contours. No large area pulmonary consolidation. No pleural effusion or pneumothorax. Small right middle lobe spiculated nodule not well demonstrated on current exam. IMPRESSION: No acute cardiopulmonary process. Electronically Signed   By: DLovey NewcomerM.D.   On: 07/07/2020 13:54   [facility]  Result Date: 07/09/2020 Carotid Arterial Duplex Study Indications:       CVA. Risk Factors:      Hypertension, hyperlipidemia. Comparison Study:  No prior studies. Performing Technologist: GOliver HumRVT  Examination Guidelines: A complete evaluation includes B-mode imaging, spectral Doppler, color Doppler, and power Doppler as needed of all accessible portions of each vessel. Bilateral testing is considered an integral part of a complete examination. Limited examinations for reoccurring indications may be performed as noted.  Right Carotid Findings: +----------+--------+--------+--------+-----------------------+--------+           PSV cm/sEDV cm/sStenosisPlaque Description     Comments +----------+--------+--------+--------+-----------------------+--------+ CCA Prox  110     30              smooth and heterogenous          +----------+--------+--------+--------+-----------------------+--------+ CCA Distal71      26              smooth and heterogenous         +----------+--------+--------+--------+-----------------------+--------+ ICA Prox  49      20              smooth and heterogenous         +----------+--------+--------+--------+-----------------------+--------+ ICA Distal63      27                                     tortuous +----------+--------+--------+--------+-----------------------+--------+ ECA       82      21                                              +----------+--------+--------+--------+-----------------------+--------+ +----------+--------+-------+--------+-------------------+           PSV cm/sEDV cmsDescribeArm Pressure (mmHG) +----------+--------+-------+--------+-------------------+ [id number]                                        +----------+--------+-------+--------+-------------------+ +---------+--------+--+--------+--+---------+  VertebralPSV cm/s57EDV cm/s28Antegrade +---------+--------+--+--------+--+---------+  Left Carotid Findings: +----------+--------+--------+--------+-----------------------+--------+           PSV cm/sEDV cm/sStenosisPlaque Description     Comments +----------+--------+--------+--------+-----------------------+--------+ CCA Prox  98      30              smooth and heterogenous         +----------+--------+--------+--------+-----------------------+--------+ CCA Distal56      18              smooth and heterogenous         +----------+--------+--------+--------+-----------------------+--------+ ICA Prox  48      19              smooth and heterogenous         +----------+--------+--------+--------+-----------------------+--------+ ICA Distal56      26                                     tortuous +----------+--------+--------+--------+-----------------------+--------+ ECA       40      6                                                +----------+--------+--------+--------+-----------------------+--------+ +----------+--------+--------+--------+-------------------+           PSV cm/sEDV cm/sDescribeArm Pressure (mmHG) +----------+--------+--------+--------+-------------------+ [id number]                                          +----------+--------+--------+--------+-------------------+ +---------+--------+--+--------+--+---------+ VertebralPSV cm/s43EDV cm/s20Antegrade +---------+--------+--+--------+--+---------+   Summary: Right Carotid: Velocities in the right ICA are consistent with a 1-39% stenosis. Left Carotid: Velocities in the left ICA are consistent with a 1-39% stenosis. Vertebrals: Bilateral vertebral arteries demonstrate antegrade flow. *See table(s) above for measurements and observations.     Preliminary     Assessment and Plan:   Recurrent Syncope - Patient reports recurrent syncope over the last 2 months. He reports he has about 1 episode every week. He has had 1-2 episodes this admission. Sounds like both episodes occurred while trying to void so possible micturition syncope. Patient states episodes at home occur when standing and are proceeded by lightheadedness and dizziness. No palpitations.  - Telemetry shows occasional PVCs but no sustained ventricular arrhyhtmia. No concerning arrhythmias to explain syncope. - Echo pending.  - Preliminary read of carotid dopplers shows only minimal 1-39% disease bilaterally. - Orthostatics today shows BP of 106/73 (HR 88) when laying and 141/99 (HR 89) when sitting. Not sure what to make of this. Have asked RN to repeat orthostatics later. - Possible micturition syncope. Reassuring that no concerning arrhythmias noted on telemetry during these events. However, given reduced EF, will interrogate device to make sure he has not had any ventricular arrhythmias at home.  Mixed Ischemic/Non-ischemic  Cardiomyopathy Chronic Systolic CHF s/p ICD Non-Compaction Cardiomyoatphy - Last Echo in 12/2019 showed LVEF of 20-25% with global hypokinesis and moderate asymmetric left ventricular hypertrophy of inferolateral segment.  - Repeat Echo pending.  - Patient appears euvolemic on exam.  - Home medications (Entresto, Coreg, and Spirolactone) on hold due to concerns of recurrent syncope. Interestingly, on PTA medications patient only reports taking Entresto and Coreg at night.  - Recommend repeating orthostatics later today. If  negative, may be able to try to restart home medications.  - Continue to monitor volume status closely. - On chronic Coumadin for non-compaction cariomyopathy. Dosing per pharmacy.  CAD - Cath in 2010 showed occluded LAD with collaterals. Myoview in 09/2018 showed prior infarct but no ischemia. - EKG shows not acute ischemic changes. - No angina. - Continue aspirin and statin.  Possible Prolonged QTc - EKG showed QTc of 512m. However, on my calculation using Framingham formula, looks like 448 ms.  - Can repeat EKG.  Hyperlipidemia - Lipid panel this admission: Total Cholesterol 143, Triglycerides 103, HDL 45, LDL 77.  - LDL goal <70 given CAD. - Currently on Lipitor 246mdaily at home. Close to goal and given metastatic cancer, I think it is OK to continue this dose.  Poorly Controlled Diabetes - Hemoglobin A1c 10.5.  - Patient previously on Jardiance but he states this was discontinued due to low BP. I tried to clarify and make sure patient did not mean low blood sugar but he insists it was stopped due to low BP. Would recommend retrying Jardiance if able. - Management per primary team.   For questions or updates, please contact [facility] consult [email or web address] for contact info under    Signed, CaDarreld McleanPA-C  07/09/2020 10:22 AM

## 2020-07-09 NOTE — Progress Notes (Signed)
.    Spoke with [employer] Rep about ICD Interrogation: - Normal device function. 3 months left to EOL.  - No atrial or ventricular arrhythmias noted. However, device not scheduled to record anything less than 214 bpm.  - 1.9% PVC burden. - Optivol reading indicative of retaining more fluid over the last couple of days.  Discussed these results with Dr. Radford Pax.  [name], PA-C 07/09/2020 4:09 PM

## 2020-07-09 NOTE — Progress Notes (Signed)
Carotid artery duplex has been completed. Preliminary results can be found in CV Proc through chart review.   07/09/20 10:08 AM Dennis [name] RVT

## 2020-07-09 NOTE — Evaluation (Signed)
Occupational Therapy Evaluation Patient Details Name: Dennis [name]. MRN: [id number] DOB: 01/09/1961 Today's Date: 07/09/2020    History of Present Illness 59 yo male admitted with unsteady gait, slurred speech. Hx of CHF, DM, CVA, pacemaker, MDD, metastatic lung ca, polysubstance abuse   Clinical Impression   Pt admitted with unsteady gait Pt currently with functional limitations due to the deficits listed below (see OT Problem List).  Pt will benefit from skilled OT to increase their safety and independence with ADL and functional mobility for ADL to facilitate discharge to venue listed below.    Upon sitting with OT and RN pts BP increased.  RN advised to return to supine. Pt left with Cardiology PA and RN    Follow Up Recommendations  Home health OT;Supervision/Assistance - 24 hour;SNF (depending on progress)    Equipment Recommendations  3 in 1 bedside commode    Recommendations for Other Services       Precautions / Restrictions Precautions Precautions: Fall      Mobility Bed Mobility Overal bed mobility: Needs Assistance Bed Mobility: Supine to Sit;Sit to Supine     Supine to sit: Supervision Sit to supine: Supervision      Transfers                 General transfer comment: did not perform. RN was with OT. BP increased in sitting and pt returned to supine    Balance Overall balance assessment: Needs assistance Sitting-balance support: Single extremity supported Sitting balance-Leahy Scale: Fair         Standing balance comment: did not perform                           ADL either performed or assessed with clinical judgement   ADL Overall ADL's : Needs assistance/impaired                                Limited ADL eval due to increased BP- will further assess next OT tx                          Pertinent Vitals/Pain Pain Assessment: No/denies pain     Hand Dominance Right   Extremity/Trunk Assessment  Upper Extremity Assessment Upper Extremity Assessment: Generalized weakness           Communication Communication Communication: No difficulties   Cognition Arousal/Alertness: Awake/alert Behavior During Therapy: WFL for tasks assessed/performed Overall Cognitive Status: Within Functional Limits for tasks assessed                                                Home Living Family/patient expects to be discharged to:: Private residence Living Arrangements: Children;Parent Available Help at Discharge: Family Type of Home: House Home Access: Stairs to enter Technical brewer of Steps: 3 Entrance Stairs-Rails: Right Home Layout: Two level;Able to live on main level with bedroom/bathroom     Bathroom Shower/Tub: Tub/shower unit;Curtain   Bathroom Toilet: Standard     Home Equipment: Environmental consultant - 2 wheels   Additional Comments: pt  has girlfriend to assist.  pt has 65 year old daughter that lives with her mom.      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        OT Problem List: Decreased strength;Decreased activity tolerance;Impaired balance (sitting and/or standing);Decreased knowledge of use of DME or AE;Decreased safety awareness      OT Treatment/Interventions: Self-care/ADL training;Therapeutic activities;Patient/family education    OT Goals(Current goals can be found in the care plan section) Acute Rehab OT Goals Patient Stated Goal: get well OT Goal Formulation: With patient Time For Goal Achievement: 07/24/20 Potential to Achieve Goals: Good  OT Frequency: Min 2X/week    AM-PAC OT "6 Clicks" Daily Activity     Outcome Measure Help from another person eating meals?: A Little Help from another person taking care of personal grooming?: A Little Help from another person toileting, which includes using toliet, bedpan, or urinal?: A Lot Help  from another person bathing (including washing, rinsing, drying)?: A Lot Help from another person to put on and taking off regular upper body clothing?: A Little Help from another person to put on and taking off regular lower body clothing?: A Lot 6 Click Score: 15   End of Session Nurse Communication: Mobility status  Activity Tolerance: Treatment limited secondary to medical complications (Comment) (increased BP in sitting. See BP) Patient left: in bed;with call bell/phone within reach;with bed alarm set;with nursing/sitter in room  OT Visit Diagnosis: Unsteadiness on feet (R26.81);Other abnormalities of gait and mobility (R26.89);Muscle weakness (generalized) (M62.81);History of falling (Z91.81)                Time: 1040-1105 OT Time Calculation (min): 25 min Charges:  OT General Charges $OT Visit: 1 Visit OT Evaluation $OT Eval Moderate Complexity: 1 Mod OT Treatments $Self Care/Home Management : 8-22 mins  Kari Baars, OT Acute Rehabilitation Services [phone] Office- [phone], Edwena Felty D 07/09/2020, 2:23 PM

## 2020-07-10 ENCOUNTER — Inpatient Hospital Stay (HOSPITAL_COMMUNITY): Payer: Medicare Other

## 2020-07-10 ENCOUNTER — Encounter (HOSPITAL_COMMUNITY): Payer: Self-pay | Admitting: Internal Medicine

## 2020-07-10 DIAGNOSIS — F1410 Cocaine abuse, uncomplicated: Secondary | ICD-10-CM | POA: Diagnosis not present

## 2020-07-10 DIAGNOSIS — Z95810 Presence of automatic (implantable) cardiac defibrillator: Secondary | ICD-10-CM | POA: Diagnosis not present

## 2020-07-10 DIAGNOSIS — R2681 Unsteadiness on feet: Secondary | ICD-10-CM

## 2020-07-10 DIAGNOSIS — Z7901 Long term (current) use of anticoagulants: Secondary | ICD-10-CM

## 2020-07-10 LAB — CBC
HCT: 22.5 % — ABNORMAL LOW (ref 39.0–52.0)
Hemoglobin: 7.2 g/dL — ABNORMAL LOW (ref 13.0–17.0)
MCH: 28.8 pg (ref 26.0–34.0)
MCHC: 32 g/dL (ref 30.0–36.0)
MCV: 90 fL (ref 80.0–100.0)
Platelets: 332 10*3/uL (ref 150–400)
RBC: 2.5 MIL/uL — ABNORMAL LOW (ref 4.22–5.81)
RDW: 13.3 % (ref 11.5–15.5)
WBC: 7.4 10*3/uL (ref 4.0–10.5)
nRBC: 0 % (ref 0.0–0.2)

## 2020-07-10 LAB — RETICULOCYTES
Immature Retic Fract: 20.5 % — ABNORMAL HIGH (ref 2.3–15.9)
RBC.: 2.4 MIL/uL — ABNORMAL LOW (ref 4.22–5.81)
Retic Count, Absolute: 50.4 10*3/uL (ref 19.0–186.0)
Retic Ct Pct: 2.1 % (ref 0.4–3.1)

## 2020-07-10 LAB — PREPARE RBC (CROSSMATCH)

## 2020-07-10 LAB — BASIC METABOLIC PANEL
Anion gap: 9 (ref 5–15)
BUN: 18 mg/dL (ref 6–20)
CO2: 24 mmol/L (ref 22–32)
Calcium: 8.6 mg/dL — ABNORMAL LOW (ref 8.9–10.3)
Chloride: 103 mmol/L (ref 98–111)
Creatinine, Ser: 0.96 mg/dL (ref 0.61–1.24)
GFR calc Af Amer: >60 mL/min (ref >60)
GFR calc non Af Amer: >60 mL/min (ref >60)
Glucose, Bld: 65 mg/dL — ABNORMAL LOW (ref 70–99)
Potassium: 3.8 mmol/L (ref 3.5–5.1)
Sodium: 136 mmol/L (ref 135–145)

## 2020-07-10 LAB — ABO/RH: ABO/RH(D): A POS

## 2020-07-10 LAB — GLUCOSE, CAPILLARY
Glucose-Capillary: 54 mg/dL — ABNORMAL LOW (ref 70–99)
Glucose-Capillary: 97 mg/dL (ref 70–99)
Glucose-Capillary: 138 mg/dL — ABNORMAL HIGH (ref 70–99)
Glucose-Capillary: 94 mg/dL (ref 70–99)
Glucose-Capillary: 154 mg/dL — ABNORMAL HIGH (ref 70–99)

## 2020-07-10 LAB — VITAMIN B12: Vitamin B-12: 924 pg/mL — ABNORMAL HIGH (ref 180–914)

## 2020-07-10 LAB — AMMONIA: Ammonia: 26 umol/L (ref 9–35)

## 2020-07-10 LAB — IRON AND TIBC
Iron: 65 ug/dL (ref 45–182)
Saturation Ratios: 32 % (ref 17.9–39.5)
TIBC: 202 ug/dL — ABNORMAL LOW (ref 250–450)
UIBC: 137 ug/dL

## 2020-07-10 LAB — PROTIME-INR
INR: 1.4 — ABNORMAL HIGH (ref 0.8–1.2)
Prothrombin Time: 16.4 seconds — ABNORMAL HIGH (ref 11.4–15.2)

## 2020-07-10 LAB — FERRITIN: Ferritin: 448 ng/mL — ABNORMAL HIGH (ref 24–336)

## 2020-07-10 LAB — FOLATE: Folate: 6.9 ng/mL (ref >5.9)

## 2020-07-10 IMAGING — CT CT HEAD WO/W CM
3 of 4 series · 14 of 47 positions shown, 16 images · IV contrast (OMNIPAQUE)
Comparison: CT head without with contrast [DATE]. CT head with
contrast [DATE]

CLINICAL DATA: Small cell lung cancer. History of treated
metastasis right frontal lobe.

EXAM:
CT HEAD WITHOUT AND WITH CONTRAST
TECHNIQUE: Contiguous axial images were obtained from the base of the skull
through the vertex without and with intravenous contrast
CONTRAST:  75mL OMNIPAQUE IOHEXOL 300 MG/ML  SOLN

[Series 2: head wo · axial · 0.47mm/px · z∈[-189,-69]mm · 8 of 29 slices shown, 10 images]
[im 3/29  brain]
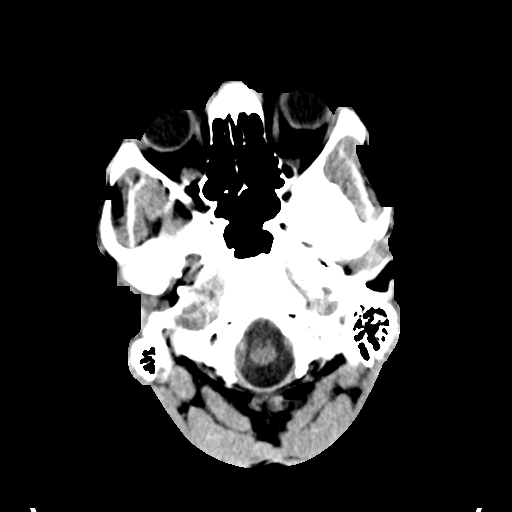
[im 3/29  bone]
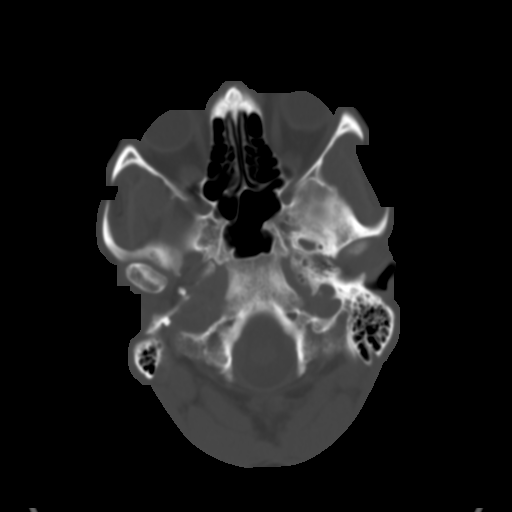
[im 7/29  brain]
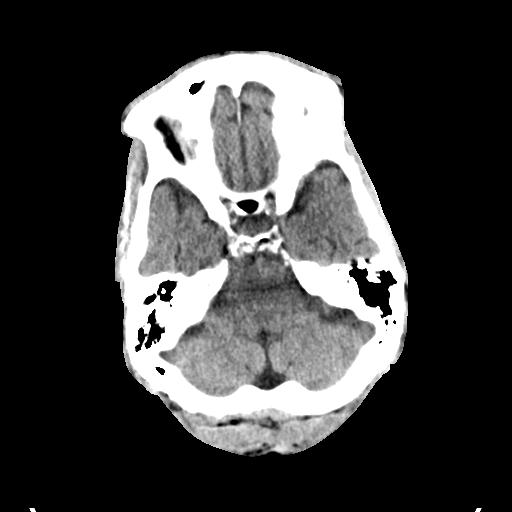
[im 11/29  brain]
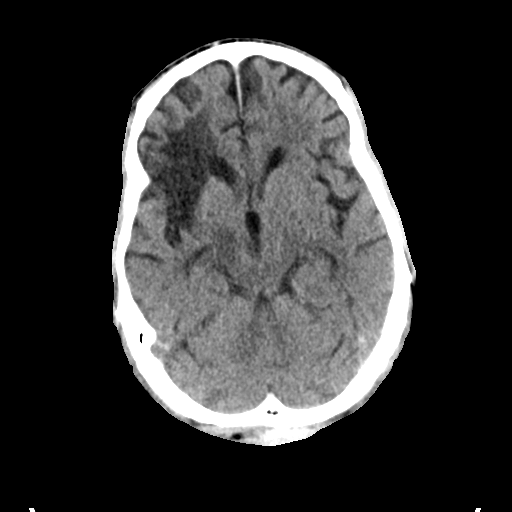
[im 13/29  brain]
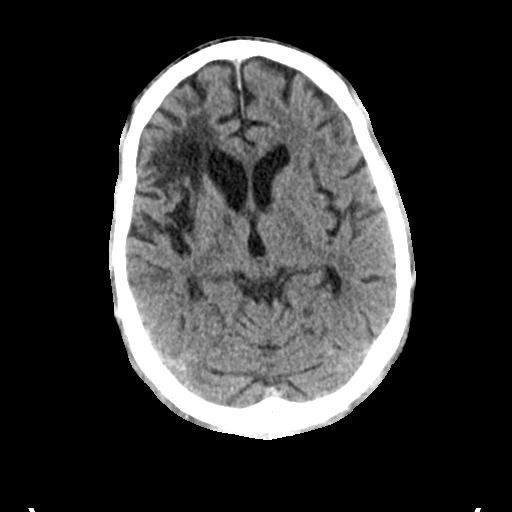
[im 17/29  brain]
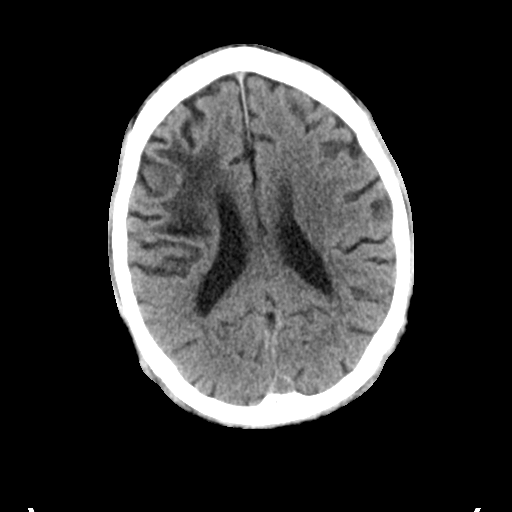
[im 17/29  bone]
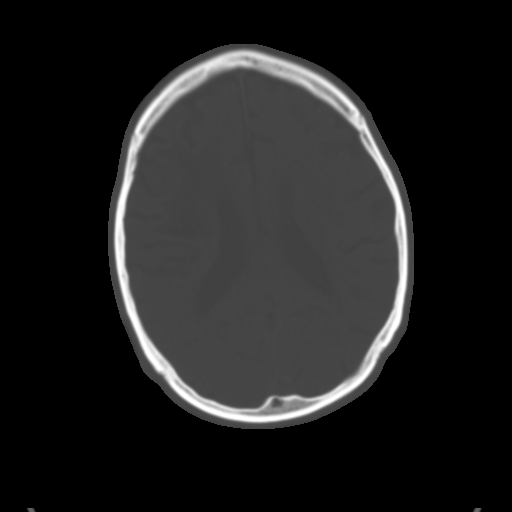
[im 19/29  brain]
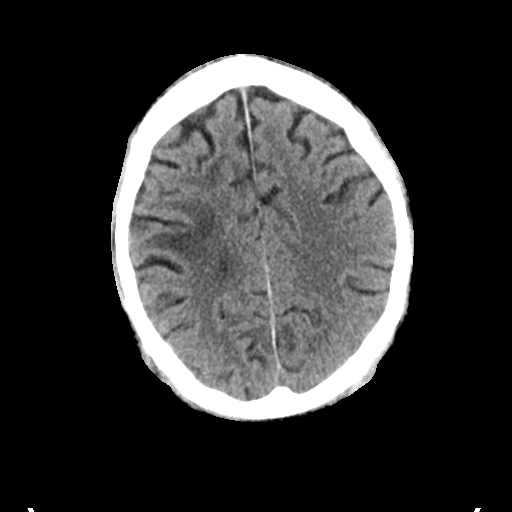
[im 23/29  brain]
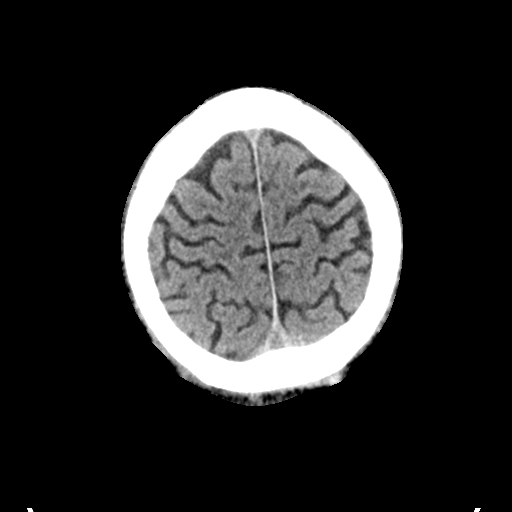
[im 27/29  brain]
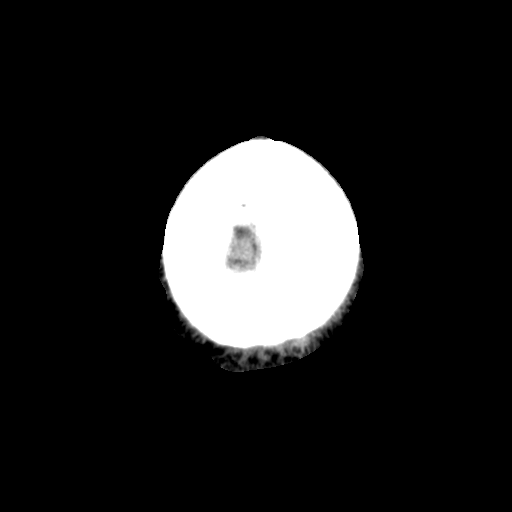

[Series 5: coronal soft tissue · coronal · 0.30mm/px · 3 of 69 slices shown]
[im 23/69  brain]
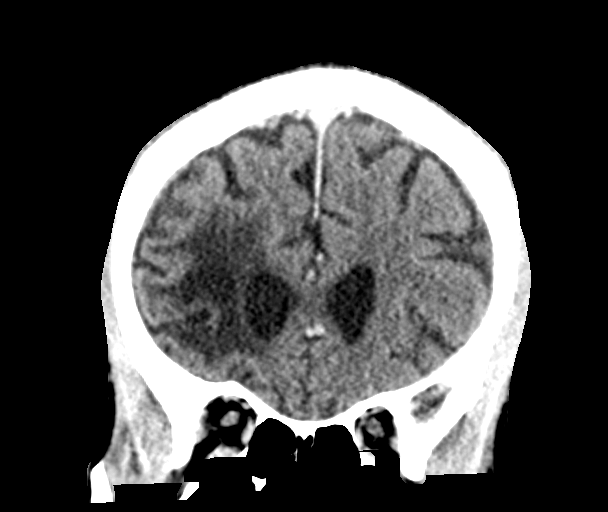
[im 31/69  brain]
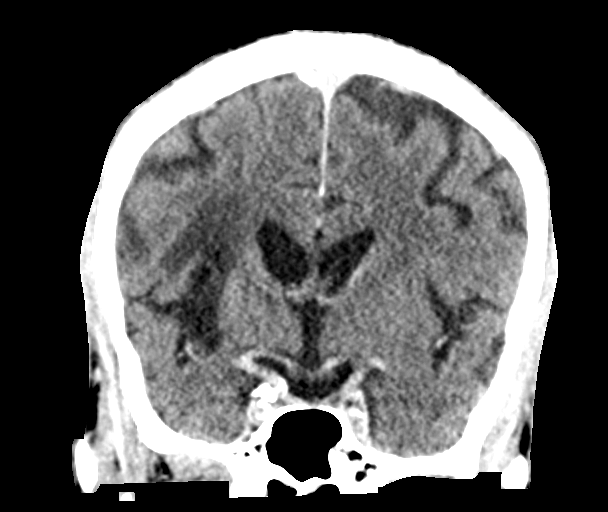
[im 38/69  brain]
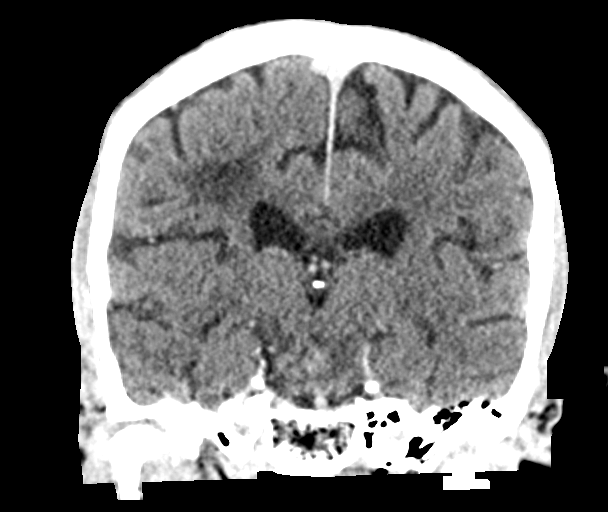

[Series 6: sagittal soft tissue · sagittal · 0.28mm/px · 3 of 58 slices shown]
[im 20/58  brain]
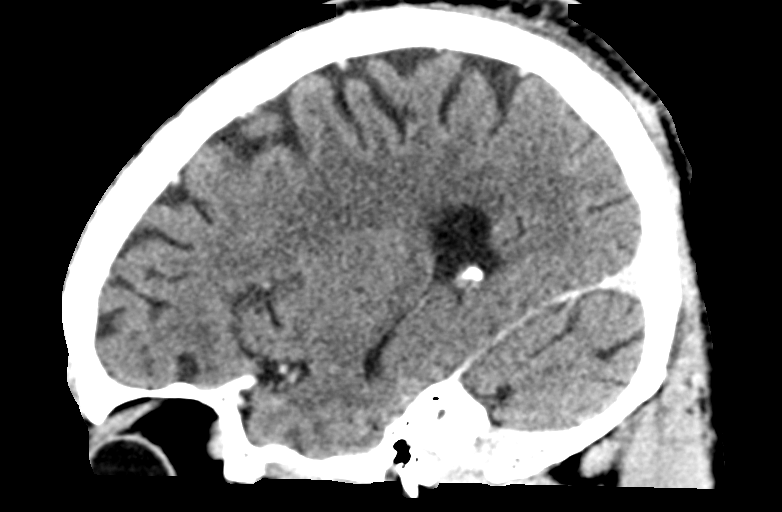
[im 29/58  brain]
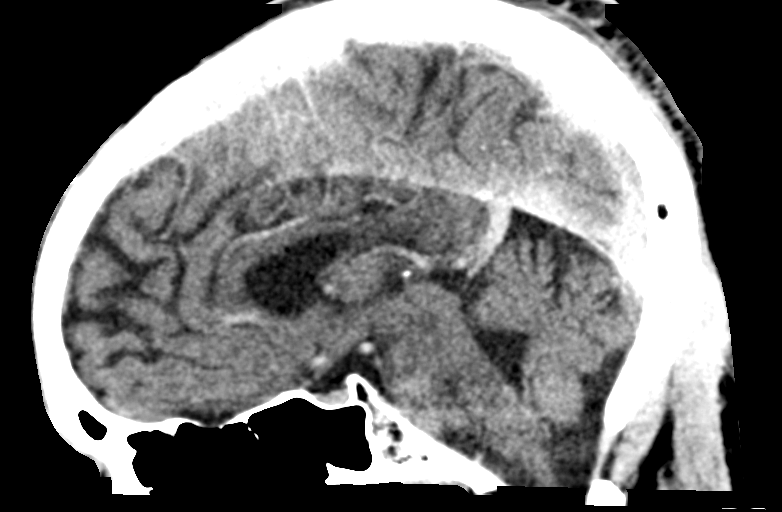
[im 39/58  brain]
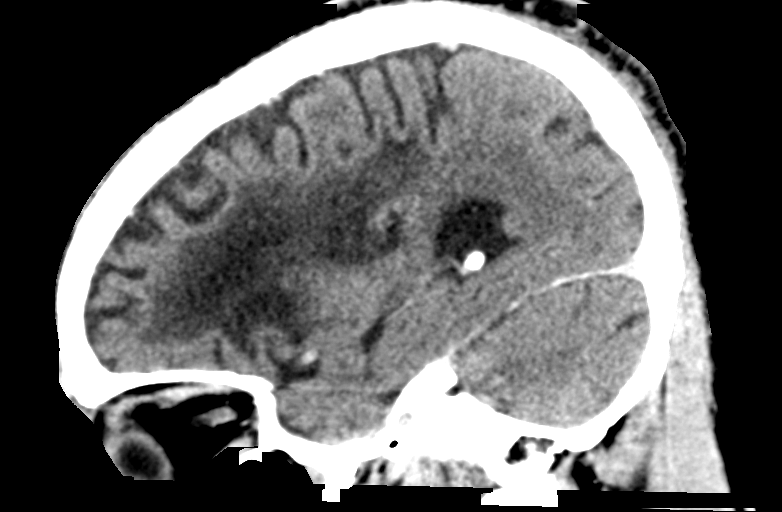

[14 of 47 positions shown; findings below may reference images not displayed]

FINDINGS: Brain: Right frontal metastasis seen on the prior study of
[DATE] has resolved. No residual enhancing lesion in the right
frontal lobe. There is hypodensity and volume loss in the right
insula and frontal lobe compatible with chronic infarct which is
unchanged.

There is patchy increased density in the ventral pons and proximal
medulla postcontrast which appears to be actual enhancement rather
than artifact. This is not seen on the precontrast study. Of
[DATE] this is similar to the recent CT but not present on
[DATE]. This area is obscured by streak artifact however is
concerning for metastatic disease. No other enhancing lesions
identified.

Negative for intracranial hemorrhage. No hydrocephalus or midline
shift. No acute infarct.

Vascular: Negative for hyperdense vessel

Skull: No skeletal lesion identified.

Sinuses/Orbits: Paranasal sinuses clear.  Negative orbit

Other: None
IMPRESSION: Treated lesion in the right frontal lobe no longer present without
residual enhancement.

There appears to be infiltrating enhancement in the ventral pons and
proximal medulla concerning for metastatic disease. This is a
relatively extensive area of enhancement. Review of the recent CT 3
days ago has a similar appearance. This would be better evaluated by
MRI but apparently the patient is not able to have an MRI. Patient
has a pacemaker. Correlate with pacemaker manufacture and model for
MRI compatibility.

Chronic right frontal infarct.  No intracranial hemorrhage.

These results were called by telephone at the time of interpretation
on [DATE] at [DATE] to provider DZALE , who verbally
acknowledged these results.

## 2020-07-10 MED ORDER — IOHEXOL 300 MG/ML  SOLN
75.0000 mL | Freq: Once | INTRAMUSCULAR | Status: AC | PRN
  Administered 2020-07-10: 75 mL via INTRAVENOUS

## 2020-07-10 MED ORDER — MIDODRINE HCL 5 MG PO TABS
2.5000 mg | ORAL_TABLET | Freq: Three times a day (TID) | ORAL | Status: DC
  Administered 2020-07-10 – 2020-07-12 (×6): 2.5 mg via ORAL
  Filled 2020-07-10 (×6): qty 1

## 2020-07-10 MED ORDER — SODIUM CHLORIDE 0.9% IV SOLUTION: Freq: Once | INTRAVENOUS | Status: AC

## 2020-07-10 MED ORDER — DEXAMETHASONE 4 MG PO TABS
4.0000 mg | ORAL_TABLET | Freq: Two times a day (BID) | ORAL | Status: DC
  Administered 2020-07-10 – 2020-07-25 (×30): 4 mg via ORAL
  Filled 2020-07-10 (×30): qty 1

## 2020-07-10 MED ORDER — WARFARIN SODIUM 5 MG PO TABS
5.0000 mg | ORAL_TABLET | Freq: Once | ORAL | Status: AC
  Administered 2020-07-10: 5 mg via ORAL
  Filled 2020-07-10: qty 1

## 2020-07-10 MED ORDER — INSULIN GLARGINE 100 UNIT/ML ~~LOC~~ SOLN
15.0000 [IU] | Freq: Every day | SUBCUTANEOUS | Status: DC
  Administered 2020-07-10 – 2020-07-12 (×3): 15 [IU] via SUBCUTANEOUS
  Filled 2020-07-10 (×4): qty 0.15

## 2020-07-10 NOTE — Progress Notes (Signed)
PROGRESS NOTE    Dennis [name].  YPP:[id number] DOB: 1961-06-20 DOA: 07/07/2020 PCP: [name], MD    Chief Complaint  Patient presents with  . 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  Initial CT of the head negative.  MRI could not be done due to ICD.  Repeat CT of the head with contrast does not show any new stroke. Discussed with Dr Rory Percy , recommended to get EEG and further evaluation of his recurrent syncope. Int he last 24 hours, pt had a syncopal episode prior to urinating, lasting for 10 seconds. He was also found to be orthostatic, . He was started on gentle hydration and his BP meds have been held.  Pt seen and examined at bedside, reports his speech is more clear today.   Assessment & Plan:   Principal Problem:   Unsteady gait when walking Active Problems:   Long term (current) use of anticoagulants   Automatic implantable cardioverter-defibrillator in situ   Chronic systolic heart failure (HCC)   Hyperlipidemia associated with type 2 diabetes mellitus (HCC)   Cocaine abuse (HCC)   Essential hypertension   Small cell lung cancer, right upper lobe ([place])   Slurred speech   Uncontrolled type 2 diabetes mellitus with hyperglycemia, with long-term current use of insulin (HCC)   Syncope    Slurred speech , right arm tingling and numbness and unsteady gait in the setting of prior CVA. -His symptoms have improved and his CT of the head without contrast and with contrast have been negative for acute stroke. -MRI could not be done  due to his ICD which was placed in 2012 probably not compatible to MRI. -His ICD was placed at [facility] and he does not have any paperwork. Echocardiogram showed left ventricular ejection fraction improved to 25 to 30% when compared to 20% of last echocardiogram. -No arrhythmias on telemetry. -Patient is noncompliant to medications. He was started on aspirin and resumed his Coumadin.  His INR remains subtherapeutic   Substance abuse/recurrent cocaine abuse Counseling given, TOC was consulted for resources     Chronic systolic heart failure s/p AICD He appears euvolemic.  Patient reports noncompliance to medications.  He was on Entresto, Coreg, spironolactone which have all been held for orthostatic hypotension.     Recurrent syncope Possibly vasovagal versus orthostatic hypotension, component of micturition syncope.  No arrhythmias on telemetry. Echocardiogram reviewed , showed Since the last exam on 12/25/2019 LVEF has slightly improved from  15-20% to 25-30% with diffuse hypokinesis, The left ventricular internal cavity size  was moderately dilated. There is mild  concentric left ventricular hypertrophy. Left ventricular diastolic  parameters are consistent with Grade II diastolic dysfunction , cardiology consulted for evaluation of recurrent syncope.  Pt denies any chest pain or sob. He reports dizziness prior to syncope. Suspicious for orthostatic hypotension leading to syncope.  His orthostatic VS were positive today and he was symptomatic. Cardiology recommended compression stockings and low dose midodrine.     Essential hypertension Blood pressure parameters are borderline normal.   Uncontrolled diabetes mellitus with hyperglycemia and hypoglycemia. : hgbA1c is  10.5 CBG (last 3)  Recent Labs    07/10/20 0731 07/10/20 0800 07/10/20 1123  GLUCAP 54* 97 138*   Suspect he was non compliant to meds at home and when we started all his meds, he became  hypoglycemic.  Cut down the lantus from 30 units to 15 units, resume SSI. And encourage snacks at night.    Hyperlipidemia Continue with Lipitor 20 mg daily.   Lethargy as per the RN, pt appears sleepy. On verbal commands he wakes up , answers all questions, no new focal deficits, he coninues to have some slurred speech. No weakness.  Waxing and waning mental status/acute encephalopathy: CT of the head with and without contrast ordered for the third time this admission for the above complaints. There appears to be infiltrating enhancement in the ventral pons and proximal medulla concerning for metastatic disease. This is a relatively extensive area of enhancement. Review of the recent CT 3 days ago has a similar appearance. Chronic right frontal infarct.  No intracranial hemorrhage.  EEG  Done, revealed no epileptiform or seizure discharges were seen throughout the recording.   Anemia of chronic disease Hemoglobin appears to be around 9, dropped to 7.2 with gentle hydration/IV fluids. ?  Hemodilution. Stool for occult blood ordered to evaluate for GI bleed.  Anemia panel appears to be unremarkable. Denies any rectal bleeding or hematuria. Oncology prefers to transfuse to keep hemoglobin greater than 8.  1 unit of PRBC transfusion ordered. Check posttransfusion CBC in the morning.    Metastatic small cell lung cancer with mets to brain status post whole brain irradiation. The patient received systemic chemotherapy with carboplatin, etoposide, and Imfinzi and is status post 6 cycles.  Starting with cycle #5 the patient is on maintenance treatment with immunotherapy with Imfinzi every 4 weeks.  As per oncology he has been tolerating his treatment without any adverse effects.   DVT prophylaxis: Coumadin Code Status: Full code family Communication: (None at bedside Disposition:   Status is: Inpatient  Remains inpatient appropriate because:Ongoing diagnostic testing needed not  appropriate for outpatient work up   Dispo: The patient is from: Home              Anticipated d/c is to: Home              Anticipated d/c date is: 1 day              Patient currently is not medically stable to d/c.       Consultants:   Cardiology  Neurology    Procedures:EEG Echocardiogram.   Antimicrobials: None.   Subjective: Pt denies any chest pain , sob, weakness, headache, dizziness today. Tingling of the left upper extremity is almost gone.  Reports he is catching up on his sleep.   Objective: Vitals:   07/10/20 0530 07/10/20 1012 07/10/20 1030 07/10/20 1301  BP: 108/77 111/73  108/76  Pulse: 86 86  89  Resp: 18 10  16   Temp: 99 F (37.2 C) 98.1 F (36.7 C) 98.9 F (37.2 C) 97.6 F (36.4 C)  TempSrc:  Oral Oral Oral  SpO2: 91% 96%  93%  Weight:      Height:        Intake/Output Summary (Last 24 hours) at 07/10/2020 1430 Last data filed at 07/10/2020 1126 Gross per 24 hour  Intake 342.84 ml  Output 775 ml  Net -432.16 ml   Filed Weights   07/08/20 0337  Weight: 62.1 kg    Examination:  General exam: Lethargic but appears comfortable,  not in any kind of distress Respiratory system: Clear to auscultation, no wheezing or rhonchi l. Cardiovascular system: S1-S2 heard, regular rate rhythm, no JVD, no pedal edema Gastrointestinal system: Abdomen is soft, nontender, nondistended, bowel sounds normal Central nervous system: lethargic but  answering  All questions appropriately. No focal deficits noted today.  Extremities: No pedal edema, cyanosis Skin: No rashes seen Psychiatry: Mood is appropriate    Data Reviewed: I have personally reviewed following labs and imaging studies  CBC: Recent Labs  Lab 07/07/20 1315 07/08/20 0542 07/09/20 1039 07/10/20 0451  WBC 7.4 6.5 6.6 7.4  NEUTROABS  --   --  4.4  --   HGB 9.8* 8.8* 7.5* 7.2*  HCT 30.4* 28.1* 24.4* 22.5*  MCV 89.4 91.2 94.2 90.0  PLT 319 307 323 540    Basic Metabolic  Panel: Recent Labs  Lab 07/07/20 1315 07/08/20 0542 07/09/20 1039 07/10/20 0451  NA 136 133* 138 136  K 4.6 3.6 4.1 3.8  CL 95* 99 102 103  CO2 28 24 26 24   GLUCOSE 188* 147* 139* 65*  BUN 23* 21* 17 18  CREATININE 1.33* 1.15 1.14 0.96  CALCIUM 9.7 8.8* 8.7* 8.6*    GFR: Estimated Creatinine Clearance: 73.7 mL/min (by C-G formula based on SCr of 0.96 mg/dL).  Liver Function Tests: Recent Labs  Lab 07/07/20 1315 07/08/20 0542  AST 20 18  ALT 17 16  ALKPHOS 88 83  BILITOT 0.8 0.7  PROT 7.4 6.8  ALBUMIN 3.2* 3.0*    CBG: Recent Labs  Lab 07/09/20 1644 07/09/20 2206 07/10/20 0731 07/10/20 0800 07/10/20 1123  GLUCAP 145* 226* 54* 97 138*     Recent Results (from the past 240 hour(s))  Respiratory Panel by RT PCR (Flu A&B, Covid) - Nasopharyngeal Swab     Status: None   Collection Time: 07/07/20  9:15 PM   Specimen: Nasopharyngeal Swab  Result Value Ref Range Status   SARS Coronavirus 2 by RT PCR NEGATIVE NEGATIVE Final    Comment: (NOTE) SARS-CoV-2 target nucleic acids are NOT DETECTED.  The SARS-CoV-2 RNA is generally detectable in upper respiratoy specimens during the acute phase of infection. The lowest concentration of SARS-CoV-2 viral copies this assay can detect is 131 copies/mL. A negative result does not preclude SARS-Cov-2 infection and should not be used as the sole basis for treatment or other patient management decisions. A negative result may occur with  improper specimen collection/handling, submission of specimen other than nasopharyngeal swab, presence of viral mutation(s) within the areas targeted by this assay, and inadequate number of viral copies (<131 copies/mL). A negative result must be combined with clinical observations, patient history, and epidemiological information. The expected result is Negative.  Fact Sheet for Patients:  PinkCheek.be  Fact Sheet for Healthcare Providers:   GravelBags.it  This test is no t yet approved or cleared by the [place] FDA and  has been authorized for detection and/or diagnosis of SARS-CoV-2 by FDA under an Emergency Use Authorization (EUA). This EUA will remain  in effect (meaning this test can be used) for the duration of the COVID-19 declaration under Section 564(b)(1) of the Act, 21 U.S.C. section 360bbb-3(b)(1), unless the authorization is terminated or revoked sooner.     Influenza A by PCR NEGATIVE NEGATIVE Final   Influenza B by PCR NEGATIVE NEGATIVE Final    Comment: (NOTE) The Xpert Xpress SARS-CoV-2/FLU/RSV assay is intended as an aid in  the diagnosis of  influenza from Nasopharyngeal swab specimens and  should not be used as a sole basis for treatment. Nasal washings and  aspirates are unacceptable for Xpert Xpress SARS-CoV-2/FLU/RSV  testing.  Fact Sheet for Patients: PinkCheek.be  Fact Sheet for Healthcare Providers: GravelBags.it  This test is not yet approved or cleared by the [place] FDA and  has been authorized for detection and/or diagnosis of SARS-CoV-2 by  FDA under an Emergency Use Authorization (EUA). This EUA will remain  in effect (meaning this test can be used) for the duration of the  Covid-19 declaration under Section 564(b)(1) of the Act, 21  U.S.C. section 360bbb-3(b)(1), unless the authorization is  terminated or revoked. Performed at [facility], [place] [address] [place], [place] [address]          Radiology Studies: EEG adult  Result Date: July 25, 2020 Lora Havens, MD     07/25/20  3:39 PM Patient Name: Dennis [name]. MRN: [id number] Epilepsy Attending: Lora Havens Referring Physician/Provider: Dr. Hosie Poisson Date: 2020/07/25 Duration: 23.22 mins Patient history: 59 year old male with previous right MCA stroke, metastatic small lung cancer with mets to  brain status post whole brain radiation, ongoing cocaine abuse who had an episode of transient speech disturbance.  EEG to evaluate for seizures. Level of alertness: Awake, assleep AEDs during EEG study: None Technical aspects: This EEG study was done with scalp electrodes positioned according to the 10-20 International system of electrode placement. Electrical activity was acquired at a sampling rate of 500Hz  and reviewed with a high frequency filter of 70Hz  and a low frequency filter of 1Hz . EEG data were recorded continuously and digitally stored. Description: The posterior dominant rhythm consists of 9 Hz activity of moderate voltage (25-35 uV) seen predominantly in posterior head regions, symmetric and reactive to eye opening and eye closing.  Sleep was characterized by vertex waves, sleep spindles (12 to 14 Hz), maximal frontocentral region.  Hyperventilation and photic stimulation were not performed.   IMPRESSION: This study is within normal limits. No seizures or epileptiform discharges were seen throughout the recording. Lora Havens   ECHOCARDIOGRAM COMPLETE  Result Date: July 25, 2020    ECHOCARDIOGRAM REPORT   Patient Name:   Dennis [name]. Date of Exam: 07-25-2020 Medical Rec #:  [id number]       Height:       72.0 in Accession #:    [id number]      Weight:       137.0 lb Date of Birth:  [birth year]        BSA:          1.814 m Patient Age:    33 years        BP:           100/65 mmHg Patient Gender: M               HR:           94 bpm. Exam Location:  Inpatient Procedure: 2D Echo Indications:    TIA G45.9  History:        Patient has prior history of Echocardiogram examinations, most                 recent 12/25/2019. CHF and Ischemic Cardiomyopathy, CAD,                 Defibrillator; Risk Factors:Diabetes, Drug and Alcohol abuse and  Dyslipidemia.  Sonographer:    Mikki Santee RDCS (AE) Referring Phys: [place]  1. Since the last exam on 12/25/2019 LVEF has  slightly improved from 15-20% to 25-30% with diffuse hypokinesis.  2. Left ventricular ejection fraction, by estimation, is 25 to 30%. The left ventricle has severely decreased function. The left ventricle demonstrates global hypokinesis. The left ventricular internal cavity size was moderately dilated. There is mild concentric left ventricular hypertrophy. Left ventricular diastolic parameters are consistent with Grade II diastolic dysfunction (pseudonormalization). Elevated left atrial pressure.  3. Right ventricular systolic function is normal. The right ventricular size is normal. There is normal pulmonary artery systolic pressure.  4. A small pericardial effusion is present. The pericardial effusion is posterior to the left ventricle.  5. The mitral valve is normal in structure. Mild mitral valve regurgitation. No evidence of mitral stenosis.  6. The aortic valve is normal in structure. Aortic valve regurgitation is trivial. No aortic stenosis is present.  7. The inferior vena cava is normal in size with greater than 50% respiratory variability, suggesting right atrial pressure of 3 mmHg. FINDINGS  Left Ventricle: Left ventricular ejection fraction, by estimation, is 25 to 30%. The left ventricle has severely decreased function. The left ventricle demonstrates global hypokinesis. The left ventricular internal cavity size was moderately dilated. There is mild concentric left ventricular hypertrophy. Left ventricular diastolic parameters are consistent with Grade II diastolic dysfunction (pseudonormalization). Elevated left atrial pressure. Right Ventricle: The right ventricular size is normal. No increase in right ventricular wall thickness. Right ventricular systolic function is normal. There is normal pulmonary artery systolic pressure. The tricuspid regurgitant velocity is 1.77 m/s, and  with an assumed right atrial pressure of 8 mmHg, the estimated right ventricular systolic pressure is 52.7 mmHg. Left  Atrium: Left atrial size was normal in size. Right Atrium: Right atrial size was normal in size. Pericardium: A small pericardial effusion is present. The pericardial effusion is posterior to the left ventricle. Mitral Valve: The mitral valve is normal in structure. Mild mitral valve regurgitation. No evidence of mitral valve stenosis. Tricuspid Valve: The tricuspid valve is normal in structure. Tricuspid valve regurgitation is mild . No evidence of tricuspid stenosis. Aortic Valve: The aortic valve is normal in structure. Aortic valve regurgitation is trivial. No aortic stenosis is present. Pulmonic Valve: The pulmonic valve was normal in structure. Pulmonic valve regurgitation is trivial. No evidence of pulmonic stenosis. Aorta: The aortic root is normal in size and structure. Venous: The inferior vena cava is normal in size with greater than 50% respiratory variability, suggesting right atrial pressure of 3 mmHg. IAS/Shunts: No atrial level shunt detected by color flow Doppler. Additional Comments: A pacer wire is visualized in the right atrium and right ventricle.  LEFT VENTRICLE PLAX 2D LVIDd:         6.40 cm  Diastology LVIDs:         5.40 cm  LV e' medial: 9.68 cm/s LV PW:         1.20 cm LV IVS:        1.10 cm LVOT diam:     2.20 cm LV SV:         69 LV SV Index:   38 LVOT Area:     3.80 cm  RIGHT VENTRICLE RV S prime:     12.30 cm/s TAPSE (M-mode): 2.3 cm LEFT ATRIUM             Index  RIGHT ATRIUM           Index LA diam:        3.20 cm 1.76 cm/m  RA Area:     18.00 cm LA Vol (A2C):   57.9 ml 31.92 ml/m RA Volume:   46.30 ml  25.52 ml/m LA Vol (A4C):   53.6 ml 29.55 ml/m LA Biplane Vol: 61.1 ml 33.68 ml/m  AORTIC VALVE LVOT Vmax:   111.67 cm/s LVOT Vmean:  82.300 cm/s LVOT VTI:    0.180 m  AORTA Ao Root diam: 3.70 cm TRICUSPID VALVE TR Peak grad:   12.5 mmHg TR Vmax:        177.00 cm/s  SHUNTS Systemic VTI:  0.18 m Systemic Diam: 2.20 cm Ena Dawley MD Electronically signed by Ena Dawley MD Signature Date/Time: 07/09/2020/12:16:56 PM    Final    VAS US CAROTID (at [facility] and [facility] only)  Result Date: 07/09/2020 Carotid Arterial Duplex Study Indications:       CVA. Risk Factors:      Hypertension, hyperlipidemia. Comparison Study:  No prior studies. Performing Technologist: Oliver Hum RVT  Examination Guidelines: A complete evaluation includes B-mode imaging, spectral Doppler, color Doppler, and power Doppler as needed of all accessible portions of each vessel. Bilateral testing is considered an integral part of a complete examination. Limited examinations for reoccurring indications may be performed as noted.  Right Carotid Findings: +----------+--------+--------+--------+-----------------------+--------+           PSV cm/sEDV cm/sStenosisPlaque Description     Comments +----------+--------+--------+--------+-----------------------+--------+ CCA Prox  110     30              smooth and heterogenous         +----------+--------+--------+--------+-----------------------+--------+ CCA Distal71      26              smooth and heterogenous         +----------+--------+--------+--------+-----------------------+--------+ ICA Prox  49      20              smooth and heterogenous         +----------+--------+--------+--------+-----------------------+--------+ ICA Distal63      27                                     tortuous +----------+--------+--------+--------+-----------------------+--------+ ECA       82      21                                              +----------+--------+--------+--------+-----------------------+--------+ +----------+--------+-------+--------+-------------------+           PSV cm/sEDV cmsDescribeArm Pressure (mmHG) +----------+--------+-------+--------+-------------------+ [id number]                                         +----------+--------+-------+--------+-------------------+  +---------+--------+--+--------+--+---------+ VertebralPSV cm/s57EDV cm/s28Antegrade +---------+--------+--+--------+--+---------+  Left Carotid Findings: +----------+--------+--------+--------+-----------------------+--------+           PSV cm/sEDV cm/sStenosisPlaque Description     Comments +----------+--------+--------+--------+-----------------------+--------+ CCA Prox  98      30              smooth and heterogenous         +----------+--------+--------+--------+-----------------------+--------+ CCA Distal56  18              smooth and heterogenous         +----------+--------+--------+--------+-----------------------+--------+ ICA Prox  48      19              smooth and heterogenous         +----------+--------+--------+--------+-----------------------+--------+ ICA Distal56      26                                     tortuous +----------+--------+--------+--------+-----------------------+--------+ ECA       40      6                                               +----------+--------+--------+--------+-----------------------+--------+ +----------+--------+--------+--------+-------------------+           PSV cm/sEDV cm/sDescribeArm Pressure (mmHG) +----------+--------+--------+--------+-------------------+ [id number]                                          +----------+--------+--------+--------+-------------------+ +---------+--------+--+--------+--+---------+ VertebralPSV cm/s43EDV cm/s20Antegrade +---------+--------+--+--------+--+---------+   Summary: Right Carotid: Velocities in the right ICA are consistent with a 1-39% stenosis. Left Carotid: Velocities in the left ICA are consistent with a 1-39% stenosis. Vertebrals: Bilateral vertebral arteries demonstrate antegrade flow. *See table(s) above for measurements and observations.  Electronically signed by Antony Contras MD on 07/09/2020 at 12:52:15 PM.    Final         Scheduled  Meds: . aspirin EC  81 mg Oral Daily  . atorvastatin  20 mg Oral Daily  . insulin aspart  0-15 Units Subcutaneous TID AC & HS  . insulin glargine  15 Units Subcutaneous Q2200  . midodrine  2.5 mg Oral TID WC  . warfarin  5 mg Oral ONCE-1600  . Warfarin - Pharmacist Dosing Inpatient   Does not apply q1600   Continuous Infusions:    LOS: 1 day        Hosie Poisson, MD [facility]   To contact the attending provider between 7A-7P or the covering provider during after hours 7P-7A, please log into the web site [email or web address] and access using universal [place] password for that web site. If you do not have the password, please call the hospital operator.  07/10/2020, 2:30 PM

## 2020-07-10 NOTE — Progress Notes (Signed)
  Speech Language Pathology Treatment: Dysphagia  Patient Details Name: Dennis [name]. MRN: [id number] DOB: Jun 10, 1961 Today's Date: 07/10/2020 Time: 2440-1027 SLP Time Calculation (min) (ACUTE ONLY): 29 min  Assessment / Plan / Recommendation Clinical Impression  Significant cough post-swallow of yale 3 ounce water test -causing pt to expectorate a minimal amount of secretions.  He states sometimes he gets "choked" but denies frequently occuring.  SLP observed pt with intake also of burger with left labial loss of mustard/condiments without awareness.  He admits his speech is not normal and masticates in prolonged fashion for oral clearance presumed due to lingual weakness.  Advised pt to masticate on his strong right side and assure adequate clearance of food boluses.  Of note, no indication of aspiration with small sips of thin via straw - and thus SLP advised pt to follow this precaution. His last CXR was negative.  Pt with significant lingual weakness on left - ? due to prior neuro event - however he states it is worse.  Will follow up for swallowing and speech training for airway protection and ease of communication.    HPI HPI: 59 yo male admitted with unsteady gait, slurred speech. Hx of CHF, DM, CVA, pacemaker, MDD, metastatic lung ca, polysubstance abuse.  Pt with prior h/o right frontal event.  Swallow and speech eval conducted.      SLP Plan  Continue with current plan of care       Recommendations  Diet recommendations: Regular;Thin liquid Liquids provided via: Cup;Straw Medication Administration: Whole meds with liquid Supervision: Patient able to self feed Compensations: Slow rate;Small sips/bites;Lingual sweep for clearance of pocketing (oral pocketing on left check) Postural Changes and/or Swallow Maneuvers: Seated upright 90 degrees;Out of bed for meals                Oral Care Recommendations: Oral care BID Follow up Recommendations: None SLP Visit Diagnosis:  Dysphagia, oropharyngeal phase (R13.12) Plan: Continue with current plan of care       GO                Dennis [name] 07/10/2020, 1:31 PM   Dennis Lime, MS [place] Office [phone]

## 2020-07-10 NOTE — Progress Notes (Signed)
Hypoglycemic Event  CBG: 54  Treatment: 240 ml apple juice  Symptoms: asymptomatic  Follow-up CBG: Time: 800 CBG Result: 97  Possible Reasons for Event: unknown  Comments/MD notified: MD notified     Candie Mile

## 2020-07-10 NOTE — Progress Notes (Signed)
Physical Therapy Treatment Patient Details Name: Dennis [name]. MRN: [id number] DOB: 01-Dec-1960 Today's Date: 07/10/2020    History of Present Illness 59 yo male admitted with unsteady gait, slurred speech. Hx of CHF, DM, CVA, pacemaker, MDD, metastatic lung ca, polysubstance abuse CT head pending this date    PT Comments    Pt denies dizziness this pm. Repeated sit to stands and performed standing ex prior to gait. Chair follow for safety d/t pt hx of syncope. Will continue to assess for d/c needs, may benefit from cane to incr gait stability and HHPT at d/c .  Follow Up Recommendations  Supervision for mobility/OOB;Home health PT     Equipment Recommendations  Other (comment) (TBD--may need cane )    Recommendations for Other Services       Precautions / Restrictions Precautions Precautions: Fall Precaution Comments: high fall risk! passed out with no warning signs with NT and on PT eval Restrictions Weight Bearing Restrictions: No    Mobility  Bed Mobility Overal bed mobility: Needs Assistance Bed Mobility: Supine to Sit     Supine to sit: Min guard     General bed mobility comments: for safety (hx of syncope)  Transfers Overall transfer level: Needs assistance Equipment used: None Transfers: Sit to/from Stand Sit to Stand: Min guard;Min assist         General transfer comment: cues for safety, self monitor for dizziness  Ambulation/Gait Ambulation/Gait assistance: Min assist Gait Distance (Feet): 200 Feet (x2) Assistive device: None;1 person hand held assist Gait Pattern/deviations: Step-through pattern;Decreased stance time - left;Drifts right/left     General Gait Details: unsteady gait,  LOB x2 with min assist to recover. decr dorsiflexion with knee hyperextension in stance on L      Stairs             Wheelchair Mobility    Modified Rankin (Stroke Patients Only)       Balance   Sitting-balance support: No upper extremity  supported;Feet supported Sitting balance-Leahy Scale: Fair     Standing balance support: Single extremity supported;No upper extremity supported Standing balance-Leahy Scale: Fair Standing balance comment: able to static stand with close supervision for safety,  requires unilateral UE support to stand and use urinal                            Cognition Arousal/Alertness: Awake/alert Behavior During Therapy: WFL for tasks assessed/performed Overall Cognitive Status: Within Functional Limits for tasks assessed                                        Exercises General Exercises - Lower Extremity Ankle Circles/Pumps: AROM;Both;10 reps Hip Flexion/Marching: AROM;Both;10 reps    General Comments        Pertinent Vitals/Pain Pain Assessment: No/denies pain    Home Living                      Prior Function            PT Goals (current goals can now be found in the care plan section) Acute Rehab PT Goals Patient Stated Goal: get well PT Goal Formulation: With patient Time For Goal Achievement: 07/22/20 Potential to Achieve Goals: Good Progress towards PT goals: Progressing toward goals    Frequency    Min 3X/week      PT  Plan Current plan remains appropriate    Co-evaluation              AM-PAC PT "6 Clicks" Mobility   Outcome Measure  Help needed turning from your back to your side while in a flat bed without using bedrails?: None Help needed moving from lying on your back to sitting on the side of a flat bed without using bedrails?: A Little Help needed moving to and from a bed to a chair (including a wheelchair)?: A Little Help needed standing up from a chair using your arms (e.g., wheelchair or bedside chair)?: A Little Help needed to walk in hospital room?: A Little Help needed climbing 3-5 steps with a railing? : A Little 6 Click Score: 19    End of Session Equipment Utilized During Treatment: Gait belt Activity  Tolerance: Patient tolerated treatment well Patient left: in chair;with call bell/phone within reach;with chair alarm set Nurse Communication: Mobility status PT Visit Diagnosis: Difficulty in walking, not elsewhere classified (R26.2)     Time: 5188-4166 PT Time Calculation (min) (ACUTE ONLY): 23 min  Charges:  $Gait Training: 23-37 mins                     Baxter Flattery, PT  Acute Rehab Dept ([place]) [phone] Pager [phone]  07/10/2020    [facility] 07/10/2020, 3:57 PM

## 2020-07-10 NOTE — Progress Notes (Signed)
ANTICOAGULATION CONSULT NOTE -  Consult  Pharmacy Consult for Warfarin Indication: advanced (noncompaction) cardiomyopathy  No Known Allergies  Patient Measurements: Height: 6' (182.9 cm) Weight: 62.1 kg (137 lb) IBW/kg (Calculated) : 77.6  Vital Signs: Temp: 98.9 F (37.2 C) (09/28 1030) Temp Source: Oral (09/28 1030) BP: 111/73 (09/28 1012) Pulse Rate: 86 (09/28 1012)  Labs: Recent Labs    07/08/20 0542 07/08/20 0542 07/09/20 1039 07/09/20 1210 07/10/20 0451  HGB 8.8*   < > 7.5*  --  7.2*  HCT 28.1*  --  24.4*  --  22.5*  PLT 307  --  323  --  332  APTT 38*  --   --   --   --   LABPROT 14.1  --   --  15.1 16.4*  INR 1.1  --   --  1.2 1.4*  CREATININE 1.15  --  1.14  --  0.96   < > = values in this interval not displayed.    Estimated Creatinine Clearance: 73.7 mL/min (by C-G formula based on SCr of 0.96 mg/dL).  Medications:  Scheduled:  . aspirin EC  81 mg Oral Daily  . atorvastatin  20 mg Oral Daily  . insulin aspart  0-15 Units Subcutaneous TID AC & HS  . insulin glargine  15 Units Subcutaneous Q2200  . midodrine  2.5 mg Oral TID WC  . 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 Per med rec and chart records on visit on 9/3, pt is on Warfarin 7.5 mg On Tues/Thur/Sat and 3.75 mg on all other days. Baseline INR subtherapeutic on admission; possible noncompliance.  Today, 07/10/20   INR remains low but rising appropriately  Hgb low; stable from yesterday but no signs of bleeding noted per RN or MD; Plt stable WNL. Given lethargy and apnea, Cards rechecking CT head to r/o bleed given brain mets on warfarin  No major drug-drug interactions with warfarin; continues on concomitant ASA from PTA  Eating 100% of meals per RN  Goal of Therapy:  INR 2-3 Monitor platelets by anticoagulation protocol: Yes   Plan:    Warfarin 5 mg PO x1 again tonight  Daily INR   For discharge, recommend resuming PTA dosing with INR check in 3-5 days  Monitor for signs and symptoms of bleeding - f/u repeat Tracy, PharmD, BCPS [phone] 07/10/2020, 11:04 AM

## 2020-07-10 NOTE — Progress Notes (Signed)
PT Cancellation Note  Patient Details Name: Dennis [name]. MRN: [id number] DOB: [birth year]   Cancelled Treatment:    Reason Eval/Treat Not Completed: Patient at procedure or test/unavailable. Attempted x2 Pt gone for CT    [facility] 07/10/2020, 3:13 PM

## 2020-07-10 NOTE — Progress Notes (Signed)
Progress Note  Patient Name: Dennis [name]. Date of Encounter: 07/10/2020  Primary Cardiologist: Glori Bickers, MD   Subjective   Very lethargic this am and having trouble staying awake.  Orthostatic Bps markedly positive today (laying 100/69>>sitting 76/7mmHg).  ICD interrogated yesterday and showed normal device function with 3 months EOL and no arrhythmias but device set only to detect tachy arrhythmias >214bpm.  1.9% PVC load.  Optivol reading mildly elevated for past few days.   Inpatient Medications    Scheduled Meds: . aspirin EC  81 mg Oral Daily  . atorvastatin  20 mg Oral Daily  . insulin aspart  0-15 Units Subcutaneous TID AC & HS  . insulin glargine  15 Units Subcutaneous Q2200  . Warfarin - Pharmacist Dosing Inpatient   Does not apply q1600   Continuous Infusions:  PRN Meds: acetaminophen **OR** acetaminophen (TYLENOL) oral liquid 160 mg/5 mL **OR** acetaminophen, ondansetron (ZOFRAN) IV, polyethylene glycol   Vital Signs    Vitals:   07/09/20 1500 07/09/20 2209 07/10/20 0530 07/10/20 1012  BP: 137/89 131/76 108/77 111/73  Pulse: 88 96 86 86  Resp: 16 18 18 10   Temp: 98.7 F (37.1 C) 98.3 F (36.8 C) 99 F (37.2 C) 98.1 F (36.7 C)  TempSrc: Oral   Oral  SpO2: 97% 94% 91% 96%  Weight:      Height:        Intake/Output Summary (Last 24 hours) at 07/10/2020 1027 Last data filed at 07/10/2020 0738 Gross per 24 hour  Intake 1022.91 ml  Output 775 ml  Net 247.91 ml   Filed Weights   07/08/20 0337  Weight: 62.1 kg    Telemetry    NSR - Personally Reviewed  ECG    NSR with LVH by voltage and anterior infarct - Personally Reviewed  Physical Exam   GEN: No acute distress.   Neck: No JVD Cardiac: RRR, no murmurs, rubs, or gallops.  Respiratory: Clear to auscultation bilaterally. GI: Soft, nontender, non-distended  MS: No edema; No deformity. Neuro:  Nonfocal  Psych: Normal affect   Labs    Chemistry Recent Labs  Lab 07/07/20 1315  07/07/20 1315 07/08/20 0542 07/09/20 1039 07/10/20 0451  NA 136   < > 133* 138 136  K 4.6   < > 3.6 4.1 3.8  CL 95*   < > 99 102 103  CO2 28   < > 24 26 24   GLUCOSE 188*   < > 147* 139* 65*  BUN 23*   < > 21* 17 18  CREATININE 1.33*   < > 1.15 1.14 0.96  CALCIUM 9.7   < > 8.8* 8.7* 8.6*  PROT 7.4  --  6.8  --   --   ALBUMIN 3.2*  --  3.0*  --   --   AST 20  --  18  --   --   ALT 17  --  16  --   --   ALKPHOS 88  --  83  --   --   BILITOT 0.8  --  0.7  --   --   GFRNONAA 59*   < > >60 >60 >60  GFRAA >60   < > >60 >60 >60  ANIONGAP 13   < > 10 10 9    < > = values in this interval not displayed.     Hematology Recent Labs  Lab 07/08/20 0542 07/09/20 1039 07/10/20 0451  WBC 6.5 6.6 7.4  RBC 3.08*  2.59* 2.50*  HGB 8.8* 7.5* 7.2*  HCT 28.1* 24.4* 22.5*  MCV 91.2 94.2 90.0  MCH 28.6 29.0 28.8  MCHC 31.3 30.7 32.0  RDW 13.0 13.2 13.3  PLT 307 323 332    Cardiac EnzymesNo results for input(s): TROPONINI in the last 168 hours. No results for input(s): TROPIPOC in the last 168 hours.   BNPNo results for input(s): BNP, PROBNP in the last 168 hours.   DDimer No results for input(s): DDIMER in the last 168 hours.   Radiology    EEG adult  Result Date: 07/09/2020 Dennis Havens, MD     07/09/2020  3:39 PM Patient Name: Dennis [name]. MRN: [id number] Epilepsy Attending: Lora [name] Referring Physician/Provider: Dr. Hosie Poisson Date: 07/09/2020 Duration: 23.22 mins Patient history: 59 year old male with previous right MCA stroke, metastatic small lung cancer with mets to brain status post whole brain radiation, ongoing cocaine abuse who had an episode of transient speech disturbance.  EEG to evaluate for seizures. Level of alertness: Awake, assleep AEDs during EEG study: None Technical aspects: This EEG study was done with scalp electrodes positioned according to the 10-20 International system of electrode placement. Electrical activity was acquired at a sampling rate of  500Hz  and reviewed with a high frequency filter of 70Hz  and a low frequency filter of 1Hz . EEG data were recorded continuously and digitally stored. Description: The posterior dominant rhythm consists of 9 Hz activity of moderate voltage (25-35 uV) seen predominantly in posterior head regions, symmetric and reactive to eye opening and eye closing.  Sleep was characterized by vertex waves, sleep spindles (12 to 14 Hz), maximal frontocentral region.  Hyperventilation and photic stimulation were not performed.   IMPRESSION: This study is within normal limits. No seizures or epileptiform discharges were seen throughout the recording. Dennis [name]   ECHOCARDIOGRAM COMPLETE  Result Date: 07/09/2020    ECHOCARDIOGRAM REPORT   Patient Name:   Dennis [name]. Date of Exam: 07/09/2020 Medical Rec #:  [id number]       Height:       72.0 in Accession #:    [id number]      Weight:       137.0 lb Date of Birth:  Nov 11, 1960        BSA:          1.814 m Patient Age:    17 years        BP:           100/65 mmHg Patient Gender: M               HR:           94 bpm. Exam Location:  Inpatient Procedure: 2D Echo Indications:    TIA G45.9  History:        Patient has prior history of Echocardiogram examinations, most                 recent 12/25/2019. CHF and Ischemic Cardiomyopathy, CAD,                 Defibrillator; Risk Factors:Diabetes, Drug and Alcohol abuse and                 Dyslipidemia.  Sonographer:    Mikki Santee RDCS (AE) Referring Phys: [place]  1. Since the last exam on 12/25/2019 LVEF has slightly improved from 15-20% to 25-30% with diffuse hypokinesis.  2. Left ventricular ejection fraction, by estimation, is 25 to 30%.  The left ventricle has severely decreased function. The left ventricle demonstrates global hypokinesis. The left ventricular internal cavity size was moderately dilated. There is mild concentric left ventricular hypertrophy. Left ventricular diastolic parameters are  consistent with Grade II diastolic dysfunction (pseudonormalization). Elevated left atrial pressure.  3. Right ventricular systolic function is normal. The right ventricular size is normal. There is normal pulmonary artery systolic pressure.  4. A small pericardial effusion is present. The pericardial effusion is posterior to the left ventricle.  5. The mitral valve is normal in structure. Mild mitral valve regurgitation. No evidence of mitral stenosis.  6. The aortic valve is normal in structure. Aortic valve regurgitation is trivial. No aortic stenosis is present.  7. The inferior vena cava is normal in size with greater than 50% respiratory variability, suggesting right atrial pressure of 3 mmHg. FINDINGS  Left Ventricle: Left ventricular ejection fraction, by estimation, is 25 to 30%. The left ventricle has severely decreased function. The left ventricle demonstrates global hypokinesis. The left ventricular internal cavity size was moderately dilated. There is mild concentric left ventricular hypertrophy. Left ventricular diastolic parameters are consistent with Grade II diastolic dysfunction (pseudonormalization). Elevated left atrial pressure. Right Ventricle: The right ventricular size is normal. No increase in right ventricular wall thickness. Right ventricular systolic function is normal. There is normal pulmonary artery systolic pressure. The tricuspid regurgitant velocity is 1.77 m/s, and  with an assumed right atrial pressure of 8 mmHg, the estimated right ventricular systolic pressure is 02.4 mmHg. Left Atrium: Left atrial size was normal in size. Right Atrium: Right atrial size was normal in size. Pericardium: A small pericardial effusion is present. The pericardial effusion is posterior to the left ventricle. Mitral Valve: The mitral valve is normal in structure. Mild mitral valve regurgitation. No evidence of mitral valve stenosis. Tricuspid Valve: The tricuspid valve is normal in structure.  Tricuspid valve regurgitation is mild . No evidence of tricuspid stenosis. Aortic Valve: The aortic valve is normal in structure. Aortic valve regurgitation is trivial. No aortic stenosis is present. Pulmonic Valve: The pulmonic valve was normal in structure. Pulmonic valve regurgitation is trivial. No evidence of pulmonic stenosis. Aorta: The aortic root is normal in size and structure. Venous: The inferior vena cava is normal in size with greater than 50% respiratory variability, suggesting right atrial pressure of 3 mmHg. IAS/Shunts: No atrial level shunt detected by color flow Doppler. Additional Comments: A pacer wire is visualized in the right atrium and right ventricle.  LEFT VENTRICLE PLAX 2D LVIDd:         6.40 cm  Diastology LVIDs:         5.40 cm  LV e' medial: 9.68 cm/s LV PW:         1.20 cm LV IVS:        1.10 cm LVOT diam:     2.20 cm LV SV:         69 LV SV Index:   38 LVOT Area:     3.80 cm  RIGHT VENTRICLE RV S prime:     12.30 cm/s TAPSE (M-mode): 2.3 cm LEFT ATRIUM             Index       RIGHT ATRIUM           Index LA diam:        3.20 cm 1.76 cm/m  RA Area:     18.00 cm LA Vol (A2C):   57.9 ml 31.92 ml/m RA Volume:  46.30 ml  25.52 ml/m LA Vol (A4C):   53.6 ml 29.55 ml/m LA Biplane Vol: 61.1 ml 33.68 ml/m  AORTIC VALVE LVOT Vmax:   111.67 cm/s LVOT Vmean:  82.300 cm/s LVOT VTI:    0.180 m  AORTA Ao Root diam: 3.70 cm TRICUSPID VALVE TR Peak grad:   12.5 mmHg TR Vmax:        177.00 cm/s  SHUNTS Systemic VTI:  0.18 m Systemic Diam: 2.20 cm Ena Dawley MD Electronically signed by Ena Dawley MD Signature Date/Time: 07/09/2020/12:16:56 PM    Final    VAS US CAROTID (at [facility] and [facility] only)  Result Date: 07/09/2020 Carotid Arterial Duplex Study Indications:       CVA. Risk Factors:      Hypertension, hyperlipidemia. Comparison Study:  No prior studies. Performing Technologist: Oliver Hum RVT  Examination Guidelines: A complete evaluation includes B-mode imaging, spectral Doppler,  color Doppler, and power Doppler as needed of all accessible portions of each vessel. Bilateral testing is considered an integral part of a complete examination. Limited examinations for reoccurring indications may be performed as noted.  Right Carotid Findings: +----------+--------+--------+--------+-----------------------+--------+           PSV cm/sEDV cm/sStenosisPlaque Description     Comments +----------+--------+--------+--------+-----------------------+--------+ CCA Prox  110     30              smooth and heterogenous         +----------+--------+--------+--------+-----------------------+--------+ CCA Distal71      26              smooth and heterogenous         +----------+--------+--------+--------+-----------------------+--------+ ICA Prox  49      20              smooth and heterogenous         +----------+--------+--------+--------+-----------------------+--------+ ICA Distal63      27                                     tortuous +----------+--------+--------+--------+-----------------------+--------+ ECA       82      21                                              +----------+--------+--------+--------+-----------------------+--------+ +----------+--------+-------+--------+-------------------+           PSV cm/sEDV cmsDescribeArm Pressure (mmHG) +----------+--------+-------+--------+-------------------+ [id number]                                         +----------+--------+-------+--------+-------------------+ +---------+--------+--+--------+--+---------+ VertebralPSV cm/s57EDV cm/s28Antegrade +---------+--------+--+--------+--+---------+  Left Carotid Findings: +----------+--------+--------+--------+-----------------------+--------+           PSV cm/sEDV cm/sStenosisPlaque Description     Comments +----------+--------+--------+--------+-----------------------+--------+ CCA Prox  98      30              smooth and  heterogenous         +----------+--------+--------+--------+-----------------------+--------+ CCA Distal56      18              smooth and heterogenous         +----------+--------+--------+--------+-----------------------+--------+ ICA Prox  48      19  smooth and heterogenous         +----------+--------+--------+--------+-----------------------+--------+ ICA Distal56      26                                     tortuous +----------+--------+--------+--------+-----------------------+--------+ ECA       40      6                                               +----------+--------+--------+--------+-----------------------+--------+ +----------+--------+--------+--------+-------------------+           PSV cm/sEDV cm/sDescribeArm Pressure (mmHG) +----------+--------+--------+--------+-------------------+ [id number]                                          +----------+--------+--------+--------+-------------------+ +---------+--------+--+--------+--+---------+ VertebralPSV cm/s43EDV cm/s20Antegrade +---------+--------+--+--------+--+---------+   Summary: Right Carotid: Velocities in the right ICA are consistent with a 1-39% stenosis. Left Carotid: Velocities in the left ICA are consistent with a 1-39% stenosis. Vertebrals: Bilateral vertebral arteries demonstrate antegrade flow. *See table(s) above for measurements and observations.  Electronically signed by Antony Contras MD on 07/09/2020 at 12:52:15 PM.    Final     Cardiac Studies   2D echo 07/09/2020  IMPRESSIONS    1. Since the last exam on 12/25/2019 LVEF has slightly improved from  15-20% to 25-30% with diffuse hypokinesis.  2. Left ventricular ejection fraction, by estimation, is 25 to 30%. The  left ventricle has severely decreased function. The left ventricle  demonstrates global hypokinesis. The left ventricular internal cavity size  was moderately dilated. There is mild  concentric  left ventricular hypertrophy. Left ventricular diastolic  parameters are consistent with Grade II diastolic dysfunction  (pseudonormalization). Elevated left atrial pressure.  3. Right ventricular systolic function is normal. The right ventricular  size is normal. There is normal pulmonary artery systolic pressure.  4. A small pericardial effusion is present. The pericardial effusion is  posterior to the left ventricle.  5. The mitral valve is normal in structure. Mild mitral valve  regurgitation. No evidence of mitral stenosis.  6. The aortic valve is normal in structure. Aortic valve regurgitation is  trivial. No aortic stenosis is present.  7. The inferior vena cava is normal in size with greater than 50%  respiratory variability, suggesting right atrial pressure of 3 mmHg.  Patient Profile     59 y.o. 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 Jude dual chamber ICD, stroke in 4/ 2016, hyperlipidemia, type 2 diabetes mellitus, former polysubstance abuse (tobacco, cocaine, and alcohol), and non-compliance who is being seen today for the evaluation of recurrent syncope at the request of Dr. Karleen Hampshire.  Assessment & Plan    Recurrent Syncope - Patient reports recurrent syncope over the last 2 months. He reports he has about 1 episode every week. He has had 1-2 episodes this admission. Sounds like both episodes occurred while trying to void so possible micturition syncope. Patient states episodes at home occur when standing and are proceeded by lightheadedness and dizziness. No palpitations.  - Telemetry  shows occasional PVCs but no sustained ventricular arrhyhtmia. No concerning arrhythmias to explain syncope. - Echo showed slightly improvement with EF increased from 15-20% now  to 25-30% with G2DD, mild MR and small PE - Preliminary read of carotid dopplers shows only minimal 1-39% disease bilaterally. - Orthostatics today are markedly positive and SBP drops from 100>23mmHg with going from laying to sitting -he is having a lot of problems staying awake and is very lethargic -he clearly has orthostatic hypotension and also micturition syncope -?? Etiology for sudden onset in past 3 months>>?? Related to brain mets especially given the new lethargy and difficulty staying awake as well as having apneas witnessed by nurses today -Tele shows NSR and EKG normal -ICD interrogated with no arrhythmias but only detects tachycardia >214bpm -will ask Oncology to evaluate -place thigh high compression hose, abdominal binder -start Midodrine 2.5mg  TID  -all heart failure meds on hold -will repeat head CT stat to assess for head bleed given new lethargy and apneas to make sure he does not now have a bleed since he is on coumadin with brain mets.  Mixed Ischemic/Non-ischemic Cardiomyopathy Chronic Systolic CHF s/p ICD Non-Compaction Cardiomyopathy - Last Echo in 12/2019 showed LVEF of 20-25% with global hypokinesis and moderate asymmetric left ventricular hypertrophy of inferolateral segment.  - Repeat Echo with slight improvement in LVF with EF 25-30% - Patient appears euvolemic on exam but optivol slightly up on ICD check - Home medications (Entresto, Coreg, and Spirolactone) on hold due to concerns of recurrent syncope. Interestingly, on PTA medications patient only reports taking Entresto and Coreg at night.  - Recommend repeating orthostatics tomorrow am - Continue to monitor volume status closely. - On chronic Coumadin for non-compaction cariomyopathy. Dosing per pharmacy.  CAD - Cath in 2010 showed occluded LAD with collaterals. Myoview in 09/2018 showed prior infarct but no ischemia. - EKG shows not acute ischemic changes. - No angina. - Continue aspirin and  statin.  Possible Prolonged QTc - EKG showed QTc of 543ms. However, on my calculation using Framingham formula, looks like 448 ms.  - Repeat EKG with normal QTc  Hyperlipidemia - Lipid panel this admission: Total Cholesterol 143, Triglycerides 103, HDL 45, LDL 77.  - LDL goal <70 given CAD. - Currently on Lipitor 20mg  daily at home. Close to goal and given metastatic cancer, I think it is OK to continue this dose.  Poorly Controlled Diabetes - Hemoglobin A1c 10.5.  - Patient previously on Jardiance but he states this was discontinued due to low BP. I tried to clarify and make sure patient did not mean low blood sugar but he insists it was stopped due to low BP. Would recommend retrying Jardiance if able. - Management per primary team.  I have spent a total of 35 minutes with patient reviewing head CT, 2D echo, discussions with nursing staff , telemetry, EKGs, labs and examining patient as well as establishing an assessment and plan that was discussed with the patient.  > 50% of time was spent in direct patient care.       For questions or updates, please contact [facility] Please consult [email or web address] for contact info under Cardiology/STEMI.      Signed, Fransico Him, MD  07/10/2020, 10:27 AM

## 2020-07-10 NOTE — Progress Notes (Signed)
   Spoke with Dr. Julien Nordmann, patient's primary Oncologist. He will come see patient. He did recommend head CT with contrast if there was concern for brainstem lesion. He did have a head CT with and without on admission on 07/07/2020 but there is concern for new brain bleed with mental status changes today. Discussed with Dr. Radford Pax - will get repeat head CT with and without contrast to make sure there has not been any significant change.  Please see Dr. Theodosia Blender rounding note today for additional information.  [name], PA-C 07/10/2020 11:31 AM

## 2020-07-10 NOTE — Progress Notes (Addendum)
HEMATOLOGY-ONCOLOGY PROGRESS NOTE  SUBJECTIVE: The patient presented to the emergency room with unsteady gait, slurred speech, and left upper extremity tingling.  MRI of the brain without contrast was negative.  MRI cannot be done due to ICD.  Repeat CT of the head with contrast did not show any evidence of stroke.  Neurology was consulted who recommended EEG.  The patient is currently on warfarin.  His INR was subtherapeutic upon admission.  When seen today, the patient reports that he is feeling sleepy.  Reports no shortness of breath at rest.  No chest pain reported.  Denies abdominal pain, nausea, vomiting.  He is not having any headaches or dizziness today.  He is awaiting a repeat CT of the head as he was very lethargic this morning and having difficulty staying awake.  Oncology History  Small cell lung cancer, right upper lobe ([place])  01/19/2020 Initial Diagnosis   Small cell lung cancer, right upper lobe ([place])   01/31/2020 -  Chemotherapy   The patient had palonosetron (ALOXI) injection 0.25 mg, 0.25 mg, Intravenous,  Once, 4 of 4 cycles Administration: 0.25 mg (01/31/2020), 0.25 mg (03/14/2020), 0.25 mg (04/03/2020), 0.25 mg (02/21/2020) pegfilgrastim-cbqv (UDENYCA) injection 6 mg, 6 mg, Subcutaneous, Once, 4 of 4 cycles Administration: 6 mg (02/04/2020), 6 mg (02/25/2020), 6 mg (03/20/2020), 6 mg (04/07/2020) CARBOplatin (PARAPLATIN) 370 mg in sodium chloride 0.9 % 250 mL chemo infusion, 370 mg (100 % of original dose 367 mg), Intravenous,  Once, 4 of 4 cycles Dose modification: 367 mg (original dose 367 mg, Cycle 1), 430.5 mg (original dose 430.5 mg, Cycle 3), 419.5 mg (original dose 419.5 mg, Cycle 4), 357 mg (original dose 357 mg, Cycle 2) Administration: 370 mg (01/31/2020), 390 mg (03/14/2020), 420 mg (04/03/2020), 360 mg (02/21/2020) etoposide (VEPESID) 190 mg in sodium chloride 0.9 % 500 mL chemo infusion, 100 mg/m2 = 190 mg, Intravenous,  Once, 4 of 4 cycles Administration: 190 mg (01/31/2020), 190  mg (02/01/2020), 190 mg (02/02/2020), 190 mg (03/14/2020), 190 mg (03/15/2020), 190 mg (03/16/2020), 190 mg (04/03/2020), 190 mg (04/04/2020), 190 mg (04/05/2020), 190 mg (02/21/2020), 190 mg (02/22/2020), 190 mg (02/23/2020) fosaprepitant (EMEND) 150 mg in sodium chloride 0.9 % 145 mL IVPB, 150 mg, Intravenous,  Once, 4 of 4 cycles Administration: 150 mg (01/31/2020), 150 mg (03/14/2020), 150 mg (04/03/2020), 150 mg (02/21/2020) durvalumab (IMFINZI) 1,500 mg in sodium chloride 0.9 % 100 mL chemo infusion, 1,500 mg, Intravenous,  Once, 7 of 13 cycles Administration: 1,500 mg (01/31/2020), 1,500 mg (03/14/2020), 1,500 mg (04/03/2020), 1,500 mg (04/24/2020), 1,500 mg (05/23/2020), 1,500 mg (06/21/2020), 1,500 mg (02/21/2020)  for chemotherapy treatment.       REVIEW OF SYSTEMS:   Constitutional: Denies fevers, chills Eyes: Denies blurriness of vision Ears, nose, mouth, throat, and face: Denies mucositis or sore throat Respiratory: Denies cough, dyspnea or wheezes Cardiovascular: Denies palpitation, chest discomfort Gastrointestinal:  Denies nausea, heartburn or change in bowel habits Skin: Denies abnormal skin rashes Lymphatics: Denies new lymphadenopathy or easy bruising Neurological:Denies numbness, tingling or new weaknesses Behavioral/Psych: Mood is stable, no new changes  Extremities: No lower extremity edema All other systems were reviewed with the patient and are negative.  I have reviewed the past medical history, past surgical history, social history and family history with the patient and they are unchanged from previous note.   PHYSICAL EXAMINATION: ECOG PERFORMANCE STATUS: 1 - Symptomatic but completely ambulatory  Vitals:   07/10/20 1012 07/10/20 1030  BP: 111/73   Pulse: 86   Resp:  10   Temp: 98.1 F (36.7 C) 98.9 F (37.2 C)  SpO2: 96%    Filed Weights   07/08/20 0337  Weight: 62.1 kg    Intake/Output from previous day: 09/27 0701 - 09/28 0700 In: 1674.6 [P.O.:1200; I.V.:474.6] Out:  775 [Urine:775]  GENERAL:alert, no distress and comfortable SKIN: skin color, texture, turgor are normal, no rashes or significant lesions EYES: normal, Conjunctiva are pink and non-injected, sclera clear OROPHARYNX:no exudate, no erythema and lips, buccal mucosa, and tongue normal  LUNGS: clear to auscultation and percussion with normal breathing effort HEART: regular rate & rhythm and no murmurs and no lower extremity edema ABDOMEN:abdomen soft, non-tender and normal bowel sounds NEURO: alert & oriented x 3 with fluent speech, no focal motor/sensory deficits  LABORATORY DATA:  I have reviewed the data as listed CMP Latest Ref Rng & Units 07/10/2020 07/09/2020 07/08/2020  Glucose 70 - 99 mg/dL 65(L) 139(H) 147(H)  BUN 6 - 20 mg/dL 18 17 21(H)  Creatinine 0.61 - 1.24 mg/dL 0.96 1.14 1.15  Sodium 135 - 145 mmol/L 136 138 133(L)  Potassium 3.5 - 5.1 mmol/L 3.8 4.1 3.6  Chloride 98 - 111 mmol/L 103 102 99  CO2 22 - 32 mmol/L 24 26 24   Calcium 8.9 - 10.3 mg/dL 8.6(L) 8.7(L) 8.8(L)  Total Protein 6.5 - 8.1 g/dL - - 6.8  Total Bilirubin 0.3 - 1.2 mg/dL - - 0.7  Alkaline Phos 38 - 126 U/L - - 83  AST 15 - 41 U/L - - 18  ALT 0 - 44 U/L - - 16    Lab Results  Component Value Date   WBC 7.4 07/10/2020   HGB 7.2 (L) 07/10/2020   HCT 22.5 (L) 07/10/2020   MCV 90.0 07/10/2020   PLT 332 07/10/2020   NEUTROABS 4.4 07/09/2020    CT HEAD WO CONTRAST  Result Date: 07/07/2020 CLINICAL DATA:  Slurred speech and dizziness.  History of CVA. EXAM: CT HEAD WITHOUT CONTRAST TECHNIQUE: Contiguous axial images were obtained from the base of the skull through the vertex without intravenous contrast. COMPARISON:  June 07, 2020 FINDINGS: Brain: No evidence of acute infarction, hemorrhage, hydrocephalus, extra-axial collection or mass lesion/mass effect. Stable large area of hypoattenuation in the subcortical right frontal lobe and insula, with associated mild ex vacuo dilatation of the right lateral  ventricle, sequela of prior right MCA infarct. Vascular: No hyperdense vessel or unexpected calcification. Skull: Normal. Negative for fracture or focal lesion. Sinuses/Orbits: No acute finding. Other: None. IMPRESSION: 1. No acute intracranial abnormality. 2. Stable large area of hypoattenuation in the subcortical right frontal lobe and insula, with associated mild ex vacuo dilatation of the right lateral ventricle, sequela of prior right MCA infarct. Electronically Signed   By: Fidela Salisbury M.D.   On: 07/07/2020 15:17   CT HEAD W CONTRAST  Result Date: 07/07/2020 CLINICAL DATA:  Neuro deficit EXAM: CT HEAD WITH CONTRAST TECHNIQUE: Contiguous axial images were obtained from the base of the skull through the vertex with intravenous contrast. CONTRAST:  149mL OMNIPAQUE IOHEXOL 300 MG/ML  SOLN COMPARISON:  07/07/2020 head CT.  06/06/2020 head CT and prior. FINDINGS: Brain: Sequela of prior right frontal insult. No acute infarct. No intracranial hemorrhage. No abnormal enhancement or mass lesion. Mild cerebral atrophy with ex vacuo dilatation. No midline shift, ventriculomegaly or extra-axial fluid collection. Vascular: Normal intravascular enhancement. Skull: No acute finding. Sinuses/Orbits: No acute finding. Mild ethmoid sinus mucosal thickening. Other: None. IMPRESSION: No acute intracranial process.  No abnormal enhancement.  Sequela of prior right frontal insult, unchanged. Electronically Signed   By: Primitivo Gauze M.D.   On: 07/07/2020 19:43   DG Chest Port 1 View  Result Date: 07/07/2020 CLINICAL DATA:  Chills and weakness.  History of lung cancer. EXAM: PORTABLE CHEST 1 VIEW COMPARISON:  Chest radiograph 10/07/2019. FINDINGS: Stable multi lead pacer apparatus overlying the left hemithorax. Stable cardiac and mediastinal contours. No large area pulmonary consolidation. No pleural effusion or pneumothorax. Small right middle lobe spiculated nodule not well demonstrated on current exam.  IMPRESSION: No acute cardiopulmonary process. Electronically Signed   By: Lovey Newcomer M.D.   On: 07/07/2020 13:54   EEG adult  Result Date: 07/09/2020 Lora Havens, MD     07/09/2020  3:39 PM Patient Name: Dennis [name]. MRN: [id number] Epilepsy Attending: Lora Havens Referring Physician/Provider: Dr. Hosie Poisson Date: 07/09/2020 Duration: 23.22 mins Patient history: 59 year old male with previous right MCA stroke, metastatic small lung cancer with mets to brain status post whole brain radiation, ongoing cocaine abuse who had an episode of transient speech disturbance.  EEG to evaluate for seizures. Level of alertness: Awake, assleep AEDs during EEG study: None Technical aspects: This EEG study was done with scalp electrodes positioned according to the 10-20 International system of electrode placement. Electrical activity was acquired at a sampling rate of 500Hz  and reviewed with a high frequency filter of 70Hz  and a low frequency filter of 1Hz . EEG data were recorded continuously and digitally stored. Description: The posterior dominant rhythm consists of 9 Hz activity of moderate voltage (25-35 uV) seen predominantly in posterior head regions, symmetric and reactive to eye opening and eye closing.  Sleep was characterized by vertex waves, sleep spindles (12 to 14 Hz), maximal frontocentral region.  Hyperventilation and photic stimulation were not performed.   IMPRESSION: This study is within normal limits. No seizures or epileptiform discharges were seen throughout the recording. Lora Havens   ECHOCARDIOGRAM COMPLETE  Result Date: 07/09/2020    ECHOCARDIOGRAM REPORT   Patient Name:   Leland Raver. Date of Exam: 07/09/2020 Medical Rec #:  [id number]       Height:       72.0 in Accession #:    [id number]      Weight:       137.0 lb Date of Birth:  Dec 10, 1960        BSA:          1.814 m Patient Age:    60 years        BP:           100/65 mmHg Patient Gender: M               HR:           94  bpm. Exam Location:  Inpatient Procedure: 2D Echo Indications:    TIA G45.9  History:        Patient has prior history of Echocardiogram examinations, most                 recent 12/25/2019. CHF and Ischemic Cardiomyopathy, CAD,                 Defibrillator; Risk Factors:Diabetes, Drug and Alcohol abuse and                 Dyslipidemia.  Sonographer:    Mikki Santee RDCS (AE) Referring Phys: [place]  1. Since the last exam on 12/25/2019 LVEF has slightly improved  from 15-20% to 25-30% with diffuse hypokinesis.  2. Left ventricular ejection fraction, by estimation, is 25 to 30%. The left ventricle has severely decreased function. The left ventricle demonstrates global hypokinesis. The left ventricular internal cavity size was moderately dilated. There is mild concentric left ventricular hypertrophy. Left ventricular diastolic parameters are consistent with Grade II diastolic dysfunction (pseudonormalization). Elevated left atrial pressure.  3. Right ventricular systolic function is normal. The right ventricular size is normal. There is normal pulmonary artery systolic pressure.  4. A small pericardial effusion is present. The pericardial effusion is posterior to the left ventricle.  5. The mitral valve is normal in structure. Mild mitral valve regurgitation. No evidence of mitral stenosis.  6. The aortic valve is normal in structure. Aortic valve regurgitation is trivial. No aortic stenosis is present.  7. The inferior vena cava is normal in size with greater than 50% respiratory variability, suggesting right atrial pressure of 3 mmHg. FINDINGS  Left Ventricle: Left ventricular ejection fraction, by estimation, is 25 to 30%. The left ventricle has severely decreased function. The left ventricle demonstrates global hypokinesis. The left ventricular internal cavity size was moderately dilated. There is mild concentric left ventricular hypertrophy. Left ventricular diastolic parameters are  consistent with Grade II diastolic dysfunction (pseudonormalization). Elevated left atrial pressure. Right Ventricle: The right ventricular size is normal. No increase in right ventricular wall thickness. Right ventricular systolic function is normal. There is normal pulmonary artery systolic pressure. The tricuspid regurgitant velocity is 1.77 m/s, and  with an assumed right atrial pressure of 8 mmHg, the estimated right ventricular systolic pressure is 34.2 mmHg. Left Atrium: Left atrial size was normal in size. Right Atrium: Right atrial size was normal in size. Pericardium: A small pericardial effusion is present. The pericardial effusion is posterior to the left ventricle. Mitral Valve: The mitral valve is normal in structure. Mild mitral valve regurgitation. No evidence of mitral valve stenosis. Tricuspid Valve: The tricuspid valve is normal in structure. Tricuspid valve regurgitation is mild . No evidence of tricuspid stenosis. Aortic Valve: The aortic valve is normal in structure. Aortic valve regurgitation is trivial. No aortic stenosis is present. Pulmonic Valve: The pulmonic valve was normal in structure. Pulmonic valve regurgitation is trivial. No evidence of pulmonic stenosis. Aorta: The aortic root is normal in size and structure. Venous: The inferior vena cava is normal in size with greater than 50% respiratory variability, suggesting right atrial pressure of 3 mmHg. IAS/Shunts: No atrial level shunt detected by color flow Doppler. Additional Comments: A pacer wire is visualized in the right atrium and right ventricle.  LEFT VENTRICLE PLAX 2D LVIDd:         6.40 cm  Diastology LVIDs:         5.40 cm  LV e' medial: 9.68 cm/s LV PW:         1.20 cm LV IVS:        1.10 cm LVOT diam:     2.20 cm LV SV:         69 LV SV Index:   38 LVOT Area:     3.80 cm  RIGHT VENTRICLE RV S prime:     12.30 cm/s TAPSE (M-mode): 2.3 cm LEFT ATRIUM             Index       RIGHT ATRIUM           Index LA diam:        3.20  cm 1.76 cm/m  RA Area:     18.00 cm LA Vol (A2C):   57.9 ml 31.92 ml/m RA Volume:   46.30 ml  25.52 ml/m LA Vol (A4C):   53.6 ml 29.55 ml/m LA Biplane Vol: 61.1 ml 33.68 ml/m  AORTIC VALVE LVOT Vmax:   111.67 cm/s LVOT Vmean:  82.300 cm/s LVOT VTI:    0.180 m  AORTA Ao Root diam: 3.70 cm TRICUSPID VALVE TR Peak grad:   12.5 mmHg TR Vmax:        177.00 cm/s  SHUNTS Systemic VTI:  0.18 m Systemic Diam: 2.20 cm Ena Dawley MD Electronically signed by Ena Dawley MD Signature Date/Time: 07/09/2020/12:16:56 PM    Final    VAS US CAROTID (at [facility] and [facility] only)  Result Date: 07/09/2020 Carotid Arterial Duplex Study Indications:       CVA. Risk Factors:      Hypertension, hyperlipidemia. Comparison Study:  No prior studies. Performing Technologist: Oliver Hum RVT  Examination Guidelines: A complete evaluation includes B-mode imaging, spectral Doppler, color Doppler, and power Doppler as needed of all accessible portions of each vessel. Bilateral testing is considered an integral part of a complete examination. Limited examinations for reoccurring indications may be performed as noted.  Right Carotid Findings: +----------+--------+--------+--------+-----------------------+--------+           PSV cm/sEDV cm/sStenosisPlaque Description     Comments +----------+--------+--------+--------+-----------------------+--------+ CCA Prox  110     30              smooth and heterogenous         +----------+--------+--------+--------+-----------------------+--------+ CCA Distal71      26              smooth and heterogenous         +----------+--------+--------+--------+-----------------------+--------+ ICA Prox  49      20              smooth and heterogenous         +----------+--------+--------+--------+-----------------------+--------+ ICA Distal63      27                                     tortuous +----------+--------+--------+--------+-----------------------+--------+ ECA        82      21                                              +----------+--------+--------+--------+-----------------------+--------+ +----------+--------+-------+--------+-------------------+           PSV cm/sEDV cmsDescribeArm Pressure (mmHG) +----------+--------+-------+--------+-------------------+ [id number]                                         +----------+--------+-------+--------+-------------------+ +---------+--------+--+--------+--+---------+ VertebralPSV cm/s57EDV cm/s28Antegrade +---------+--------+--+--------+--+---------+  Left Carotid Findings: +----------+--------+--------+--------+-----------------------+--------+           PSV cm/sEDV cm/sStenosisPlaque Description     Comments +----------+--------+--------+--------+-----------------------+--------+ CCA Prox  98      30              smooth and heterogenous         +----------+--------+--------+--------+-----------------------+--------+ CCA Distal56      18              smooth and heterogenous         +----------+--------+--------+--------+-----------------------+--------+  ICA Prox  48      19              smooth and heterogenous         +----------+--------+--------+--------+-----------------------+--------+ ICA Distal56      26                                     tortuous +----------+--------+--------+--------+-----------------------+--------+ ECA       40      6                                               +----------+--------+--------+--------+-----------------------+--------+ +----------+--------+--------+--------+-------------------+           PSV cm/sEDV cm/sDescribeArm Pressure (mmHG) +----------+--------+--------+--------+-------------------+ [id number]                                          +----------+--------+--------+--------+-------------------+ +---------+--------+--+--------+--+---------+ VertebralPSV cm/s43EDV cm/s20Antegrade  +---------+--------+--+--------+--+---------+   Summary: Right Carotid: Velocities in the right ICA are consistent with a 1-39% stenosis. Left Carotid: Velocities in the left ICA are consistent with a 1-39% stenosis. Vertebrals: Bilateral vertebral arteries demonstrate antegrade flow. *See table(s) above for measurements and observations.  Electronically signed by Antony Contras MD on 07/09/2020 at 12:52:15 PM.    Final     ASSESSMENT AND PLAN: [name] is a 59 year old African-American male with extensive stage small cell lung cancer who presented with a spiculated right middle lobe pulmonary nodule in addition to mediastinal lymphadenopathy and multiple metastatic brain lesions.  He is status post whole brain irradiation.  The patient received systemic chemotherapy with carboplatin, etoposide, and Imfinzi and is status post 6 cycles.  Starting with cycle #5 the patient is on maintenance treatment with immunotherapy with Imfinzi every 4 weeks.  He has been tolerating his treatment well with no concerning adverse effects.  The patient presented to the hospital with unsteady gait, slurred speech, and left upper extremity tingling.  CT of the head without contrast showed no acute intracranial abnormality and showed a stable large area of hypoattenuation in the subcortical right frontal lobe and insula with associated mild ex vacuo dilatation of the right lateral ventricle, sequela of prior right MCA infarct.  CT of the head with contrast performed the same day showed no acute intracranial process and unchanged sequela of prior right frontal insult.  The patient is being followed by the hospitalist and cardiology.  He has been having recurrent syncopal episodes.  Etiology is unclear.  He is also orthostatic.  Due to no lethargy, CT of the head has been ordered to evaluate for hemorrhage.  This is currently pending.  We will follow up on these results.  The patient is anemic with a hemoglobin of 7.2.  He is  currently on immunotherapy only which would not cause anemia.  Additional work-up in process including stool for occult blood, vitamin B12 level, folate level, iron studies, and ferritin.  Recommend PRBC transfusion to keep hemoglobin above 8.  From our standpoint, the patient needs a restaging CT of the chest, abdomen, pelvis prior to his next visit with [place] which is currently scheduled for 07/18/2020.   LOS: 1 day   Mikey Bussing, DNP, AGPCNP-BC, AOCNP 07/10/20  ADDENDUM: Hematology/Oncology Attending: I had a face-to-face encounter with the patient and did a physical exam.  I agree with the above note.  The patient is a very pleasant 59 years old African-American male with extensive stage small cell lung cancer presented with right upper lobe pulmonary nodule in addition to mediastinal lymphadenopathy and multiple metastatic brain lesions status post whole brain irradiation.  He was treated with systemic chemotherapy with carboplatin, etoposide and Imfinzi status post 7 cycles.  Starting from cycle #5 the patient is on single agent Imfinzi every 4 weeks. He presented to the hospital with unsteady gait, slurred speech and syncopal episode.  He also has some left upper extremity tingling. The patient had full evaluation by cardiology that was unremarkable. He has a pacemaker and was unable to have MRI of the brain but CT scan of the head with and without contrast performed earlier today showed suspicious infiltrative enhancement in the ventral bones and proximal medulla concerning for metastatic disease there was also relatively extensive area of enhancement. I had a lengthy discussion with the patient today about his condition and further treatment options. I will start the patient on Decadron 4 mg p.o. twice daily.  I also consulted radiation oncology to evaluate the patient for consideration of additional radiotherapy to this area. I will hold his treatment with immunotherapy for 2 more  additional weeks until he completes the radiotherapy to the brain. Thank you so much for taking good care of Dennis [name], I will continue to follow up the patient with you and assist in his management on as-needed basis.  Disclaimer: This note was dictated with voice recognition software. Similar sounding words can inadvertently be transcribed and may be missed upon review. Eilleen Kempf, MD

## 2020-07-11 DIAGNOSIS — R5381 Other malaise: Secondary | ICD-10-CM

## 2020-07-11 DIAGNOSIS — R2990 Unspecified symptoms and signs involving the nervous system: Secondary | ICD-10-CM

## 2020-07-11 DIAGNOSIS — I951 Orthostatic hypotension: Secondary | ICD-10-CM | POA: Diagnosis not present

## 2020-07-11 DIAGNOSIS — F1410 Cocaine abuse, uncomplicated: Secondary | ICD-10-CM | POA: Diagnosis not present

## 2020-07-11 DIAGNOSIS — R2681 Unsteadiness on feet: Secondary | ICD-10-CM | POA: Diagnosis not present

## 2020-07-11 LAB — BASIC METABOLIC PANEL
Anion gap: 9 (ref 5–15)
BUN: 18 mg/dL (ref 6–20)
CO2: 25 mmol/L (ref 22–32)
Calcium: 9.1 mg/dL (ref 8.9–10.3)
Chloride: 103 mmol/L (ref 98–111)
Creatinine, Ser: 0.95 mg/dL (ref 0.61–1.24)
GFR calc Af Amer: >60 mL/min (ref >60)
GFR calc non Af Amer: >60 mL/min (ref >60)
Glucose, Bld: 97 mg/dL (ref 70–99)
Potassium: 4.6 mmol/L (ref 3.5–5.1)
Sodium: 137 mmol/L (ref 135–145)

## 2020-07-11 LAB — TYPE AND SCREEN
ABO/RH(D): A POS
Antibody Screen: NEGATIVE
Unit division: 0

## 2020-07-11 LAB — CBC WITH DIFFERENTIAL/PLATELET
Abs Immature Granulocytes: 0.03 10*3/uL (ref 0.00–0.07)
Basophils Absolute: 0 10*3/uL (ref 0.0–0.1)
Basophils Relative: 0 %
Eosinophils Absolute: 0 10*3/uL (ref 0.0–0.5)
Eosinophils Relative: 1 %
HCT: 31 % — ABNORMAL LOW (ref 39.0–52.0)
Hemoglobin: 9.3 g/dL — ABNORMAL LOW (ref 13.0–17.0)
Immature Granulocytes: 0 %
Lymphocytes Relative: 13 %
Lymphs Abs: 0.9 10*3/uL (ref 0.7–4.0)
MCH: 28.5 pg (ref 26.0–34.0)
MCHC: 30 g/dL (ref 30.0–36.0)
MCV: 95.1 fL (ref 80.0–100.0)
Monocytes Absolute: 0.3 10*3/uL (ref 0.1–1.0)
Monocytes Relative: 5 %
Neutro Abs: 5.5 10*3/uL (ref 1.7–7.7)
Neutrophils Relative %: 81 %
Platelets: 352 10*3/uL (ref 150–400)
RBC: 3.26 MIL/uL — ABNORMAL LOW (ref 4.22–5.81)
RDW: 13.6 % (ref 11.5–15.5)
WBC: 6.7 10*3/uL (ref 4.0–10.5)
nRBC: 0 % (ref 0.0–0.2)

## 2020-07-11 LAB — GLUCOSE, CAPILLARY
Glucose-Capillary: 262 mg/dL — ABNORMAL HIGH (ref 70–99)
Glucose-Capillary: 355 mg/dL — ABNORMAL HIGH (ref 70–99)
Glucose-Capillary: 255 mg/dL — ABNORMAL HIGH (ref 70–99)

## 2020-07-11 LAB — BPAM RBC
Blood Product Expiration Date: 202110162359
ISSUE DATE / TIME: 202109282246
Unit Type and Rh: 6200

## 2020-07-11 LAB — PROTIME-INR
INR: 1.3 — ABNORMAL HIGH (ref 0.8–1.2)
Prothrombin Time: 15.6 seconds — ABNORMAL HIGH (ref 11.4–15.2)

## 2020-07-11 MED ORDER — WARFARIN SODIUM 5 MG PO TABS
7.5000 mg | ORAL_TABLET | Freq: Once | ORAL | Status: AC
  Administered 2020-07-11: 7.5 mg via ORAL
  Filled 2020-07-11: qty 1

## 2020-07-11 NOTE — Progress Notes (Signed)
ANTICOAGULATION CONSULT NOTE  Pharmacy Consult for Warfarin Indication: advanced (noncompaction) cardiomyopathy  No Known Allergies  Patient Measurements: Height: 6' (182.9 cm) Weight: 62.1 kg (137 lb) IBW/kg (Calculated) : 77.6  Vital Signs: Temp: 97.9 F (36.6 C) (09/29 0520) Temp Source: Oral (09/29 0520) BP: 124/88 (09/29 0520) Pulse Rate: 84 (09/29 0520)  Labs: Recent Labs    07/09/20 1039 07/09/20 1039 07/09/20 1210 07/10/20 0451 07/11/20 0509  HGB 7.5*   < >  --  7.2* 9.3*  HCT 24.4*  --   --  22.5* 31.0*  PLT 323  --   --  332 352  LABPROT  --   --  15.1 16.4* 15.6*  INR  --   --  1.2 1.4* 1.3*  CREATININE 1.14  --   --  0.96 0.95   < > = values in this interval not displayed.    Estimated Creatinine Clearance: 74.4 mL/min (by C-G formula based on SCr of 0.95 mg/dL).  Medications:  Scheduled:  . aspirin EC  81 mg Oral Daily  . atorvastatin  20 mg Oral Daily  . dexamethasone  4 mg Oral Q12H  . insulin aspart  0-15 Units Subcutaneous TID AC & HS  . insulin glargine  15 Units Subcutaneous Q2200  . midodrine  2.5 mg Oral TID WC  . 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 Per med rec and chart records on visit on 9/3, pt is on Warfarin 7.5 mg On Tues/Thur/Sat and 3.75 mg on all other days. Baseline INR subtherapeutic on admission; possible noncompliance.  Today, 07/11/20   INR remains low; trended down rather than up yesterday despite boosted doses  Hgb low but improved after PRBC yesterday; Plt stable WNL  CT head done yesterday d/t declining Hgb - showed no hemorrhage  No major drug-drug interactions with warfarin; continues on concomitant ASA from PTA  Eating 100% of meals per RN  Goal of Therapy:  INR 2-3 Monitor platelets by anticoagulation protocol: Yes   Plan:   Warfarin 7.5 mg PO x1  tonight  Daily INR   For discharge, recommend resuming PTA dosing with INR check in 3-5 days  Monitor for signs and symptoms of bleeding - f/u repeat [place], PharmD, BCPS [phone] 07/11/2020, 12:07 PM

## 2020-07-11 NOTE — Progress Notes (Signed)
Progress Note  Patient Name: Dennis [name]. Date of Encounter: 07/11/2020  [facility] Cardiologist: Glori Bickers, MD   Subjective   CT head yesterday showed possible metastatic cancer, recommending MRI brain, however had ICD. Oncology consulted. Patient feels better this morning, still has some left sided weakness and slow speech. Eating breakfast during interview.  Inpatient Medications    Scheduled Meds: . aspirin EC  81 mg Oral Daily  . atorvastatin  20 mg Oral Daily  . dexamethasone  4 mg Oral Q12H  . insulin aspart  0-15 Units Subcutaneous TID AC & HS  . insulin glargine  15 Units Subcutaneous Q2200  . midodrine  2.5 mg Oral TID WC  . Warfarin - Pharmacist Dosing Inpatient   Does not apply q1600   Continuous Infusions:  PRN Meds: acetaminophen **OR** acetaminophen (TYLENOL) oral liquid 160 mg/5 mL **OR** acetaminophen, ondansetron (ZOFRAN) IV, polyethylene glycol   Vital Signs    Vitals:   07/10/20 2314 07/11/20 0208 07/11/20 0215 07/11/20 0520  BP:  109/77  124/88  Pulse:  88  84  Resp: 17 16 19 18   Temp:  97.9 F (36.6 C)  97.9 F (36.6 C)  TempSrc:  Oral  Oral  SpO2:  97%  100%  Weight:      Height:        Intake/Output Summary (Last 24 hours) at 07/11/2020 0837 Last data filed at 07/11/2020 0500 Gross per 24 hour  Intake 353.25 ml  Output 1750 ml  Net -1396.75 ml   Last 3 Weights 07/08/2020 06/20/2020 05/23/2020  Weight (lbs) 137 lb 144 lb 14.4 oz 150 lb 8 oz  Weight (kg) 62.143 kg 65.726 kg 68.266 kg  Some encounter information is confidential and restricted. Go to Review Flowsheets activity to see all data.      Telemetry    NSR Hr 80s with 1st degree AV block, rare PVC - Personally Reviewed  ECG    NSR, 88bpm, ant q waves, possible LVH, QtC 42ms - Personally Reviewed  Physical Exam   GEN: No acute distress.   Neck: No JVD Cardiac: RRR, no murmurs, rubs, or gallops.  Respiratory: Clear to auscultation bilaterally. GI: Soft,  nontender, non-distended  MS: No edema; No deformity. Neuro:  Nonfocal  Psych: Normal affect   Labs    High Sensitivity Troponin:  No results for input(s): TROPONINIHS in the last 720 hours.    Chemistry Recent Labs  Lab 07/07/20 1315 07/07/20 1315 07/08/20 0542 07/08/20 0542 07/09/20 1039 07/10/20 0451 07/11/20 0509  NA 136   < > 133*   < > 138 136 137  K 4.6   < > 3.6   < > 4.1 3.8 4.6  CL 95*   < > 99   < > 102 103 103  CO2 28   < > 24   < > 26 24 25   GLUCOSE 188*   < > 147*   < > 139* 65* 97  BUN 23*   < > 21*   < > 17 18 18   CREATININE 1.33*   < > 1.15   < > 1.14 0.96 0.95  CALCIUM 9.7   < > 8.8*   < > 8.7* 8.6* 9.1  PROT 7.4  --  6.8  --   --   --   --   ALBUMIN 3.2*  --  3.0*  --   --   --   --   AST 20  --  18  --   --   --   --  ALT 17  --  16  --   --   --   --   ALKPHOS 88  --  83  --   --   --   --   BILITOT 0.8  --  0.7  --   --   --   --   GFRNONAA 59*   < > >60   < > >60 >60 >60  GFRAA >60   < > >60   < > >60 >60 >60  ANIONGAP 13   < > 10   < > 10 9 9    < > = values in this interval not displayed.     Hematology Recent Labs  Lab 07/09/20 1039 07/10/20 0450 07/10/20 0451 07/11/20 0509  WBC 6.6  --  7.4 6.7  RBC 2.59* 2.40* 2.50* 3.26*  HGB 7.5*  --  7.2* 9.3*  HCT 24.4*  --  22.5* 31.0*  MCV 94.2  --  90.0 95.1  MCH 29.0  --  28.8 28.5  MCHC 30.7  --  32.0 30.0  RDW 13.2  --  13.3 13.6  PLT 323  --  332 352    BNPNo results for input(s): BNP, PROBNP in the last 168 hours.   DDimer No results for input(s): DDIMER in the last 168 hours.   Radiology    CT HEAD W & WO CONTRAST  Result Date: 07/10/2020 CLINICAL DATA:  Small cell lung cancer. History of treated metastasis right frontal lobe. EXAM: CT HEAD WITHOUT AND WITH CONTRAST TECHNIQUE: Contiguous axial images were obtained from the base of the skull through the vertex without and with intravenous contrast CONTRAST:  21mL OMNIPAQUE IOHEXOL 300 MG/ML  SOLN COMPARISON:  CT head without with  contrast 07/07/2020. CT head with contrast 12/23/2019 FINDINGS: Brain: Right frontal metastasis seen on the prior study of 12/23/2019 has resolved. No residual enhancing lesion in the right frontal lobe. There is hypodensity and volume loss in the right insula and frontal lobe compatible with chronic infarct which is unchanged. There is patchy increased density in the ventral pons and proximal medulla postcontrast which appears to be actual enhancement rather than artifact. This is not seen on the precontrast study. Of 07/07/2020 this is similar to the recent CT but not present on 12/23/2019. This area is obscured by streak artifact however is concerning for metastatic disease. No other enhancing lesions identified. Negative for intracranial hemorrhage. No hydrocephalus or midline shift. No acute infarct. Vascular: Negative for hyperdense vessel Skull: No skeletal lesion identified. Sinuses/Orbits: Paranasal sinuses clear.  Negative orbit Other: None IMPRESSION: Treated lesion in the right frontal lobe no longer present without residual enhancement. There appears to be infiltrating enhancement in the ventral pons and proximal medulla concerning for metastatic disease. This is a relatively extensive area of enhancement. Review of the recent CT 3 days ago has a similar appearance. This would be better evaluated by MRI but apparently the patient is not able to have an MRI. Patient has a pacemaker. Correlate with pacemaker manufacture and model for MRI compatibility. Chronic right frontal infarct.  No intracranial hemorrhage. These results were called by telephone at the time of interpretation on 07/10/2020 at 4:00 pm to provider [facility] , who verbally acknowledged these results. Electronically Signed   By: Franchot Gallo M.D.   On: 07/10/2020 16:01   EEG adult  Result Date: 07/09/2020 Lora Havens, MD     07/09/2020  3:39 PM Patient Name: Dennis [name]. MRN: [id number] Epilepsy Attending:  Lora Havens  Referring Physician/Provider: Dr. Hosie Poisson Date: 07/09/2020 Duration: 23.22 mins Patient history: 59 year old male with previous right MCA stroke, metastatic small lung cancer with mets to brain status post whole brain radiation, ongoing cocaine abuse who had an episode of transient speech disturbance.  EEG to evaluate for seizures. Level of alertness: Awake, assleep AEDs during EEG study: None Technical aspects: This EEG study was done with scalp electrodes positioned according to the 10-20 International system of electrode placement. Electrical activity was acquired at a sampling rate of 500Hz  and reviewed with a high frequency filter of 70Hz  and a low frequency filter of 1Hz . EEG data were recorded continuously and digitally stored. Description: The posterior dominant rhythm consists of 9 Hz activity of moderate voltage (25-35 uV) seen predominantly in posterior head regions, symmetric and reactive to eye opening and eye closing.  Sleep was characterized by vertex waves, sleep spindles (12 to 14 Hz), maximal frontocentral region.  Hyperventilation and photic stimulation were not performed.   IMPRESSION: This study is within normal limits. No seizures or epileptiform discharges were seen throughout the recording. Lora Havens   ECHOCARDIOGRAM COMPLETE  Result Date: 07/09/2020    ECHOCARDIOGRAM REPORT   Patient Name:   Bon Dowis. Date of Exam: 07/09/2020 Medical Rec #:  [id number]       Height:       72.0 in Accession #:    [id number]      Weight:       137.0 lb Date of Birth:  1961-07-18        BSA:          1.814 m Patient Age:    43 years        BP:           100/65 mmHg Patient Gender: M               HR:           94 bpm. Exam Location:  Inpatient Procedure: 2D Echo Indications:    TIA G45.9  History:        Patient has prior history of Echocardiogram examinations, most                 recent 12/25/2019. CHF and Ischemic Cardiomyopathy, CAD,                 Defibrillator; Risk Factors:Diabetes,  Drug and Alcohol abuse and                 Dyslipidemia.  Sonographer:    Mikki Santee RDCS (AE) Referring Phys: Brooks  1. Since the last exam on 12/25/2019 LVEF has slightly improved from 15-20% to 25-30% with diffuse hypokinesis.  2. Left ventricular ejection fraction, by estimation, is 25 to 30%. The left ventricle has severely decreased function. The left ventricle demonstrates global hypokinesis. The left ventricular internal cavity size was moderately dilated. There is mild concentric left ventricular hypertrophy. Left ventricular diastolic parameters are consistent with Grade II diastolic dysfunction (pseudonormalization). Elevated left atrial pressure.  3. Right ventricular systolic function is normal. The right ventricular size is normal. There is normal pulmonary artery systolic pressure.  4. A small pericardial effusion is present. The pericardial effusion is posterior to the left ventricle.  5. The mitral valve is normal in structure. Mild mitral valve regurgitation. No evidence of mitral stenosis.  6. The aortic valve is normal in structure. Aortic valve regurgitation is trivial. No aortic stenosis is  present.  7. The inferior vena cava is normal in size with greater than 50% respiratory variability, suggesting right atrial pressure of 3 mmHg. FINDINGS  Left Ventricle: Left ventricular ejection fraction, by estimation, is 25 to 30%. The left ventricle has severely decreased function. The left ventricle demonstrates global hypokinesis. The left ventricular internal cavity size was moderately dilated. There is mild concentric left ventricular hypertrophy. Left ventricular diastolic parameters are consistent with Grade II diastolic dysfunction (pseudonormalization). Elevated left atrial pressure. Right Ventricle: The right ventricular size is normal. No increase in right ventricular wall thickness. Right ventricular systolic function is normal. There is normal pulmonary artery  systolic pressure. The tricuspid regurgitant velocity is 1.77 m/s, and  with an assumed right atrial pressure of 8 mmHg, the estimated right ventricular systolic pressure is 70.3 mmHg. Left Atrium: Left atrial size was normal in size. Right Atrium: Right atrial size was normal in size. Pericardium: A small pericardial effusion is present. The pericardial effusion is posterior to the left ventricle. Mitral Valve: The mitral valve is normal in structure. Mild mitral valve regurgitation. No evidence of mitral valve stenosis. Tricuspid Valve: The tricuspid valve is normal in structure. Tricuspid valve regurgitation is mild . No evidence of tricuspid stenosis. Aortic Valve: The aortic valve is normal in structure. Aortic valve regurgitation is trivial. No aortic stenosis is present. Pulmonic Valve: The pulmonic valve was normal in structure. Pulmonic valve regurgitation is trivial. No evidence of pulmonic stenosis. Aorta: The aortic root is normal in size and structure. Venous: The inferior vena cava is normal in size with greater than 50% respiratory variability, suggesting right atrial pressure of 3 mmHg. IAS/Shunts: No atrial level shunt detected by color flow Doppler. Additional Comments: A pacer wire is visualized in the right atrium and right ventricle.  LEFT VENTRICLE PLAX 2D LVIDd:         6.40 cm  Diastology LVIDs:         5.40 cm  LV e' medial: 9.68 cm/s LV PW:         1.20 cm LV IVS:        1.10 cm LVOT diam:     2.20 cm LV SV:         69 LV SV Index:   38 LVOT Area:     3.80 cm  RIGHT VENTRICLE RV S prime:     12.30 cm/s TAPSE (M-mode): 2.3 cm LEFT ATRIUM             Index       RIGHT ATRIUM           Index LA diam:        3.20 cm 1.76 cm/m  RA Area:     18.00 cm LA Vol (A2C):   57.9 ml 31.92 ml/m RA Volume:   46.30 ml  25.52 ml/m LA Vol (A4C):   53.6 ml 29.55 ml/m LA Biplane Vol: 61.1 ml 33.68 ml/m  AORTIC VALVE LVOT Vmax:   111.67 cm/s LVOT Vmean:  82.300 cm/s LVOT VTI:    0.180 m  AORTA Ao Root  diam: 3.70 cm TRICUSPID VALVE TR Peak grad:   12.5 mmHg TR Vmax:        177.00 cm/s  SHUNTS Systemic VTI:  0.18 m Systemic Diam: 2.20 cm Ena Dawley MD Electronically signed by Ena Dawley MD Signature Date/Time: 07/09/2020/12:16:56 PM    Final    VAS US CAROTID (at [facility] and [facility] only)  Result Date: 07/09/2020 Carotid Arterial Duplex Study Indications:  CVA. Risk Factors:      Hypertension, hyperlipidemia. Comparison Study:  No prior studies. Performing Technologist: Oliver Hum RVT  Examination Guidelines: A complete evaluation includes B-mode imaging, spectral Doppler, color Doppler, and power Doppler as needed of all accessible portions of each vessel. Bilateral testing is considered an integral part of a complete examination. Limited examinations for reoccurring indications may be performed as noted.  Right Carotid Findings: +----------+--------+--------+--------+-----------------------+--------+           PSV cm/sEDV cm/sStenosisPlaque Description     Comments +----------+--------+--------+--------+-----------------------+--------+ CCA Prox  110     30              smooth and heterogenous         +----------+--------+--------+--------+-----------------------+--------+ CCA Distal71      26              smooth and heterogenous         +----------+--------+--------+--------+-----------------------+--------+ ICA Prox  49      20              smooth and heterogenous         +----------+--------+--------+--------+-----------------------+--------+ ICA Distal63      27                                     tortuous +----------+--------+--------+--------+-----------------------+--------+ ECA       82      21                                              +----------+--------+--------+--------+-----------------------+--------+ +----------+--------+-------+--------+-------------------+           PSV cm/sEDV cmsDescribeArm Pressure (mmHG)  +----------+--------+-------+--------+-------------------+ [id number]                                         +----------+--------+-------+--------+-------------------+ +---------+--------+--+--------+--+---------+ VertebralPSV cm/s57EDV cm/s28Antegrade +---------+--------+--+--------+--+---------+  Left Carotid Findings: +----------+--------+--------+--------+-----------------------+--------+           PSV cm/sEDV cm/sStenosisPlaque Description     Comments +----------+--------+--------+--------+-----------------------+--------+ CCA Prox  98      30              smooth and heterogenous         +----------+--------+--------+--------+-----------------------+--------+ CCA Distal56      18              smooth and heterogenous         +----------+--------+--------+--------+-----------------------+--------+ ICA Prox  48      19              smooth and heterogenous         +----------+--------+--------+--------+-----------------------+--------+ ICA Distal56      26                                     tortuous +----------+--------+--------+--------+-----------------------+--------+ ECA       40      6                                               +----------+--------+--------+--------+-----------------------+--------+ +----------+--------+--------+--------+-------------------+  PSV cm/sEDV cm/sDescribeArm Pressure (mmHG) +----------+--------+--------+--------+-------------------+ [id number]                                          +----------+--------+--------+--------+-------------------+ +---------+--------+--+--------+--+---------+ VertebralPSV cm/s43EDV cm/s20Antegrade +---------+--------+--+--------+--+---------+   Summary: Right Carotid: Velocities in the right ICA are consistent with a 1-39% stenosis. Left Carotid: Velocities in the left ICA are consistent with a 1-39% stenosis. Vertebrals: Bilateral vertebral arteries demonstrate  antegrade flow. *See table(s) above for measurements and observations.  Electronically signed by Antony Contras MD on 07/09/2020 at 12:52:15 PM.    Final     Cardiac Studies   2D echo 07/09/2020  IMPRESSIONS    1. Since the last exam on 12/25/2019 LVEF has slightly improved from  15-20% to 25-30% with diffuse hypokinesis.  2. Left ventricular ejection fraction, by estimation, is 25 to 30%. The  left ventricle has severely decreased function. The left ventricle  demonstrates global hypokinesis. The left ventricular internal cavity size  was moderately dilated. There is mild  concentric left ventricular hypertrophy. Left ventricular diastolic  parameters are consistent with Grade II diastolic dysfunction  (pseudonormalization). Elevated left atrial pressure.  3. Right ventricular systolic function is normal. The right ventricular  size is normal. There is normal pulmonary artery systolic pressure.  4. A small pericardial effusion is present. The pericardial effusion is  posterior to the left ventricle.  5. The mitral valve is normal in structure. Mild mitral valve  regurgitation. No evidence of mitral stenosis.  6. The aortic valve is normal in structure. Aortic valve regurgitation is  trivial. No aortic stenosis is present.  7. The inferior vena cava is normal in size with greater than 50%  respiratory variability, suggesting right atrial pressure of 3 mmHg.   Patient Profile     59 y.o. 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 dual chamber ICD, stroke in 4/ 2016, hyperlipidemia, type 2 diabetes mellitus, former polysubstance abuse (tobacco, cocaine, and alcohol), and non-compliancewho is being seen today for the evaluation of recurrent syncope.  Assessment  & Plan    Recurrent Syncope - Pt reported recurrent syncope for the last 2 months. Episodes this admission during voiding. Vasovagal vs orthostatic - Tele with no ventricular arrhythmias - Echo showed improved EF, from 15-20% to 25-30% with G2DD, mild MR, and small PE - Carotid dopplers with 1-39% disease B/L - Orthostatics positive - ICD showed no significant arrhythmias, only tachycardia - With thigh high compression hose and abdominal binder - Patient had some AMS and CT head with contrast ordered which showed infiltrating enhancement in the ventral pons concerning for metastatic disease, no hemorrhage. Recommending MRI however has pacemaker which is not MRI approved (confirmed with [employer] rep).  - Oncology now following.   Mixed Ischemic/NICM CM Chronic systolic CHF s/p ICD ([employer] Jude dual chamber ICD) - Non-compaction CM - Echo 12/2019 showed LVEF 20-25% with global hypokinesis and moderate asymmetric LV hypertrophy on inferolateral segment - Repeat echo showed slight improvement in LV with EF 25-30% - Patient appears euvolemic on exam - home meds including entresto, coreg, spiro held for recurrent syncope - on chronic coumadin for non-compaction CM  CAD - Cath in 2010 with occluded LAD with collaterals - Myoview  in 2019 showed prior infarct but no ischemia - EKG shows no acute changes - Denies chest pain - continue aspirin and statin  Possible prolonged QTc - EKG 9/27 QTc 431ms  HLD - LDL 77, goal<70 - continue lipitor  DM2 - this is poorly controlled with A1C 10.5 - per IM  For questions or updates, please contact [facility] Please consult [email or web address] for contact info under        Signed, Atif Chapple Ninfa Meeker, PA-C  07/11/2020, 8:37 AM

## 2020-07-11 NOTE — Progress Notes (Signed)
PROGRESS NOTE  Dennis [name]. 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  CT head without contrast without acute finding.  Neurology consulted.  Could not do MRI due to ICD.  Repeat CT head with contrast did not reveal CVA but concern about infiltrating enhancement in ventral pons and proximal medulla concerning of metastatic disease.  EEG without epileptiform or seizure activity.  Patient had recurrent syncope with micturition raising concern for vasovagal syncope.  However, his orthostatic vitals were also positive.  Cardiology consulted.  He was started on low-dose midodrine.  Equal wheeze EF of 25 to 30% (previously 15 to 20%), diffuse hypokinesis G2-DD.  Oncology following as well.  Subjective: Seen and examined earlier this morning.  No major events overnight of this morning.  Continues to endorse LUE numbness and weakness.  He denies chest pain, shortness of breath or lightheadedness but has not moved yet.  Denies GI or UTI symptoms.  Objective: Vitals:   07/10/20 2314 07/11/20 0208 07/11/20 0215 07/11/20 0520  BP:  109/77  124/88  Pulse:  88  84  Resp: 17 16 19 18   Temp:  97.9 F (36.6 C)  97.9 F (36.6 C)  TempSrc:  Oral  Oral  SpO2:  97%  100%  Weight:      Height:        Intake/Output Summary (Last 24 hours) at 07/11/2020 1500 Last data filed at 07/11/2020 1435 Gross per 24 hour  Intake 953.25 ml  Output 1950 ml  Net -996.75 ml   Filed Weights   07/08/20 0337  Weight: 62.1 kg    Examination:  GENERAL: No apparent distress.  Nontoxic. HEENT: MMM.  Vision and hearing grossly intact.  NECK:  Supple.  No apparent JVD.  RESP: On room air.  No IWOB.  Fair aeration bilaterally. CVS:  RRR. Heart sounds normal.  ABD/GI/GU: BS+. Abd soft, NTND.  MSK/EXT:  Moves extremities. No apparent deformity. No edema.  SKIN: no apparent skin lesion or wound NEURO: Awake, alert and oriented appropriately.  Cranial nerves grossly intact.  Motor 3+/5 in LUE, 4+/5 elsewhere.  Notable pronator drift on the left.  Patellar reflex symmetric.  Light sensation seems to be intact. PSYCH: Calm. Normal affect.  Procedures:  None  Microbiology summarized: COVID-19 PCR negative. Influenza PCR negative.  Assessment & Plan: Slurred speech/LUE paresthesia/unsteady gait in patient with prior right MCA CVA-CT head with and without contrast did not reveal CVA but possible pontine and medullary infiltrating lesion concerning for metastatic disease.  Unfortunately not able to obtain MRI due to ICD.  Echocardiogram EF of 25 to 30% (improved).  EEG without seizure activity.  UDS positive for cocaine.  A1c 10.5%.  LDL 77. -Continue aspirin and warfarin -Cardiology to reach out to the vendors to see if ICD is compatible with MRI -Appreciate neurology recommendations -Emphasize risk reduction -SLP/PT/OT  Chronic systolic CHF s/p AICD in 03/7892-YB chest pain or dyspnea but reports orthopnea.  Appears euvolemic.  About 1.9 L urine output/24 hours.  Net -1.5 L. -Cardiology managing -GDMT-Home Delene Loll, Coreg and Aldactone on syncope and orthostatic hypotension   Recurrent syncope-seems to have some  component of vasovagal and orthostasis.  TTE with improved EF -Cardiac meds on hold -TED hose and midodrine per cardiology -Check orthostatic vitals daily  History of CAD with occluded LAD with collaterals-no anginal symptoms. -Continue aspirin and statin  Small cell lung cancer with brain mets s/p whole brain irradiation: CT head with and without contrast concerning for pontine and medullary infiltrate.  -Oncology  following-started on Decadron 4 mg every 12 hours and consult to radiation oncology for possible radiotherapy  Substance abuse/recurrent cocaine abuse: UDS positive for cocaine. -Counseled -TOC consulted.  Essential hypertension: Normotensive -Cardiac meds and midodrine as above  Uncontrolled diabetes mellitus with hyperglycemia and hypoglycemia: Fluctuating CBG.  A1c 10.5%.  History of noncompliance. Recent Labs  Lab 07/10/20 0800 07/10/20 1123 07/10/20 1643 07/10/20 2126 07/11/20 1153  GLUCAP 97 138* 94 154* 262*  -Continue current insulin regimen -Continue statin  Hyperlipidemia Continue with Lipitor 20 mg daily.  Lethargy/possible delirium: Reportedly had waxing and waning mental status.  Seems to have resolved.  -Avoid sedating medications -Reorientation and delirium precautions   Anemia of chronic disease: H&H stable at baseline.  Anemia panel basically normal. -Continue monitoring  Debility/physical deconditioning -PT/OT   Body mass index is 18.58 kg/m.         DVT prophylaxis:  Place TED hose Start: 07/10/20 1054 SCD's Start: 07/08/20 0127 warfarin (COUMADIN) tablet 7.5 mg    Code Status: Full code Family Communication: Patient and/or RN. Available if any question.  Status is: Inpatient  Remains inpatient appropriate because:Hemodynamically unstable, Unsafe d/c plan and IV treatments appropriate due to intensity of illness or inability to take PO   Dispo: The patient is from: Home              Anticipated d/c is to: Home              Anticipated d/c date is: 2 days              Patient currently is not medically stable to d/c.       Consultants:  Cardiology Oncology Neurology   Sch Meds:  Scheduled Meds: . aspirin EC  81 mg Oral Daily  . atorvastatin  20 mg Oral Daily  . dexamethasone  4 mg Oral Q12H  . insulin aspart  0-15 Units Subcutaneous TID AC & HS  . insulin glargine  15 Units Subcutaneous Q2200  . midodrine  2.5 mg  Oral TID WC  . warfarin  7.5 mg Oral ONCE-1600  . Warfarin - Pharmacist Dosing Inpatient   Does not apply q1600   Continuous Infusions: PRN Meds:.acetaminophen **OR** acetaminophen (TYLENOL) oral liquid 160 mg/5 mL **OR** acetaminophen, ondansetron (ZOFRAN) IV, polyethylene glycol  Antimicrobials: Anti-infectives (From admission, onward)   None       I have personally reviewed the following labs and images: CBC: Recent Labs  Lab 07/07/20 1315 07/08/20 0542 07/09/20 1039 07/10/20 0451 07/11/20 0509  WBC 7.4 6.5 6.6 7.4 6.7  NEUTROABS  --   --  4.4  --  5.5  HGB 9.8* 8.8* 7.5* 7.2* 9.3*  HCT 30.4* 28.1* 24.4* 22.5* 31.0*  MCV 89.4 91.2 94.2 90.0 95.1  PLT 319 307 323 332 352   BMP &GFR Recent Labs  Lab 07/07/20 1315 07/08/20 0542 07/09/20 1039 07/10/20 0451 07/11/20 0509  NA 136 133* 138 136 137  K 4.6 3.6 4.1 3.8 4.6  CL 95* 99 102 103 103  CO2 28 24 26 24 25   GLUCOSE 188* 147* 139* 65* 97  BUN 23* 21* 17 18 18   CREATININE 1.33* 1.15 1.14 0.96 0.95  CALCIUM 9.7 8.8* 8.7* 8.6* 9.1   Estimated Creatinine Clearance: 74.4 mL/min (by C-G formula based on SCr of 0.95 mg/dL). Liver & Pancreas: Recent Labs  Lab 07/07/20 1315 07/08/20 0542  AST 20 18  ALT 17 16  ALKPHOS 88 83  BILITOT 0.8 0.7  PROT 7.4 6.8  ALBUMIN 3.2* 3.0*   No results for input(s): LIPASE, AMYLASE in the last 168 hours. Recent Labs  Lab 07/10/20 1057  AMMONIA 26   Diabetic: No results for input(s): HGBA1C in the last 72 hours. Recent Labs  Lab 07/10/20 0800 07/10/20 1123 07/10/20 1643 07/10/20 2126 07/11/20 1153  GLUCAP 97 138* 94 154* 262*   Cardiac Enzymes: No results for input(s): CKTOTAL, CKMB, CKMBINDEX, TROPONINI in the last 168 hours. No results for input(s): PROBNP in the last 8760 hours. Coagulation Profile: Recent Labs  Lab 07/07/20 1315 07/08/20 0542 07/09/20 1210 07/10/20 0451 07/11/20 0509  INR 1.1 1.1 1.2 1.4* 1.3*   Thyroid Function Tests: No results  for input(s): TSH, T4TOTAL, FREET4, T3FREE, THYROIDAB in the last 72 hours. Lipid Profile: No results for input(s): CHOL, HDL, LDLCALC, TRIG, CHOLHDL, LDLDIRECT in the last 72 hours. Anemia Panel: Recent Labs    07/10/20 0450 07/10/20 1239  VITAMINB12  --  924*  FOLATE  --  6.9  FERRITIN  --  448*  TIBC  --  202*  IRON  --  65  RETICCTPCT 2.1  --    Urine analysis:    Component Value Date/Time   COLORURINE YELLOW 07/07/2020 1628   APPEARANCEUR CLEAR 07/07/2020 1628   LABSPEC 1.014 07/07/2020 1628   PHURINE 5.0 07/07/2020 1628   GLUCOSEU 50 (A) 07/07/2020 1628   HGBUR NEGATIVE 07/07/2020 1628   BILIRUBINUR NEGATIVE 07/07/2020 1628   KETONESUR NEGATIVE 07/07/2020 1628   PROTEINUR 100 (A) 07/07/2020 1628   UROBILINOGEN 1.0 07/16/2015 1715   NITRITE NEGATIVE 07/07/2020 1628   LEUKOCYTESUR NEGATIVE 07/07/2020 1628   Sepsis Labs: Invalid input(s): PROCALCITONIN, [place]  Microbiology: Recent Results (from the past 240 hour(s))  Respiratory Panel by RT PCR (Flu A&B, Covid) - Nasopharyngeal Swab     Status: None   Collection Time: 07/07/20  9:15 PM   Specimen: Nasopharyngeal Swab  Result Value Ref Range Status   SARS Coronavirus 2 by RT PCR NEGATIVE NEGATIVE Final    Comment: (NOTE) SARS-CoV-2 target nucleic acids are NOT DETECTED.  The SARS-CoV-2 RNA is generally detectable in upper respiratoy specimens during the acute phase of infection. The lowest concentration of SARS-CoV-2 viral copies this assay can detect is 131 copies/mL. A negative result does not preclude SARS-Cov-2 infection and should not be used as the sole basis for treatment or other patient management decisions. A negative result may occur with  improper specimen collection/handling, submission of specimen other than nasopharyngeal swab, presence of viral mutation(s) within the areas targeted by this assay, and inadequate number of viral copies (<131 copies/mL). A negative result must be combined with  clinical observations, patient history, and epidemiological information. The expected result is Negative.  Fact Sheet for Patients:  PinkCheek.be  Fact Sheet for Healthcare Providers:  GravelBags.it  This test is no t yet approved or cleared by the [place] FDA and  has been authorized for detection and/or diagnosis of SARS-CoV-2 by FDA under an Emergency Use Authorization (EUA). This EUA will remain  in effect (meaning this test can be used) for the duration of the  COVID-19 declaration under Section 564(b)(1) of the Act, 21 U.S.C. section 360bbb-3(b)(1), unless the authorization is terminated or revoked sooner.     Influenza A by PCR NEGATIVE NEGATIVE Final   Influenza B by PCR NEGATIVE NEGATIVE Final    Comment: (NOTE) The Xpert Xpress SARS-CoV-2/FLU/RSV assay is intended as an aid in  the diagnosis of influenza from Nasopharyngeal swab specimens and  should not be used as a sole basis for treatment. Nasal washings and  aspirates are unacceptable for Xpert Xpress SARS-CoV-2/FLU/RSV  testing.  Fact Sheet for Patients: PinkCheek.be  Fact Sheet for Healthcare Providers: GravelBags.it  This test is not yet approved or cleared by the [place] FDA and  has been authorized for detection and/or diagnosis of SARS-CoV-2 by  FDA under an Emergency Use Authorization (EUA). This EUA will remain  in effect (meaning this test can be used) for the duration of the  Covid-19 declaration under Section 564(b)(1) of the Act, 21  U.S.C. section 360bbb-3(b)(1), unless the authorization is  terminated or revoked. Performed at [facility], [place] [address], [place], [place] [address]     Radiology Studies: CT HEAD W & WO CONTRAST  Result Date: 07/10/2020 CLINICAL DATA:  Small cell lung cancer. History of treated metastasis right frontal lobe. EXAM: CT  HEAD WITHOUT AND WITH CONTRAST TECHNIQUE: Contiguous axial images were obtained from the base of the skull through the vertex without and with intravenous contrast CONTRAST:  52mL OMNIPAQUE IOHEXOL 300 MG/ML  SOLN COMPARISON:  CT head without with contrast 07/07/2020. CT head with contrast 12/23/2019 FINDINGS: Brain: Right frontal metastasis seen on the prior study of 12/23/2019 has resolved. No residual enhancing lesion in the right frontal lobe. There is hypodensity and volume loss in the right insula and frontal lobe compatible with chronic infarct which is unchanged. There is patchy increased density in the ventral pons and proximal medulla postcontrast which appears to be actual enhancement rather than artifact. This is not seen on the precontrast study. Of 07/07/2020 this is similar to the recent CT but not present on 12/23/2019. This area is obscured by streak artifact however is concerning for metastatic disease. No other enhancing lesions identified. Negative for intracranial hemorrhage. No hydrocephalus or midline shift. No acute infarct. Vascular: Negative for hyperdense vessel Skull: No skeletal lesion identified. Sinuses/Orbits: Paranasal sinuses clear.  Negative orbit Other: None IMPRESSION: Treated lesion in the right frontal lobe no longer present without residual enhancement. There appears to be infiltrating enhancement in the ventral pons and proximal medulla concerning for metastatic disease. This is a relatively extensive area of enhancement. Review of the recent CT 3 days ago has a similar appearance. This would be better evaluated by MRI but apparently the patient is not able to have an MRI. Patient has a pacemaker. Correlate with pacemaker manufacture and model for MRI compatibility. Chronic right frontal infarct.  No intracranial hemorrhage. These results were called by telephone at the time of interpretation on 07/10/2020 at 4:00 pm to provider [facility] , who verbally acknowledged these  results. Electronically Signed   By: Franchot Gallo M.D.   On: 07/10/2020 16:01     Jesika Men T. [place]  If 7PM-7AM, please contact night-coverage [email or web address] 07/11/2020, 3:00 PM

## 2020-07-11 NOTE — Progress Notes (Signed)
Attempted x 2 to obtain orthostatic VS, patient refused

## 2020-07-12 ENCOUNTER — Other Ambulatory Visit: Payer: Self-pay | Admitting: Radiation Therapy

## 2020-07-12 DIAGNOSIS — I951 Orthostatic hypotension: Secondary | ICD-10-CM | POA: Diagnosis not present

## 2020-07-12 DIAGNOSIS — F1410 Cocaine abuse, uncomplicated: Secondary | ICD-10-CM | POA: Diagnosis not present

## 2020-07-12 DIAGNOSIS — R2990 Unspecified symptoms and signs involving the nervous system: Secondary | ICD-10-CM | POA: Diagnosis not present

## 2020-07-12 DIAGNOSIS — R2681 Unsteadiness on feet: Secondary | ICD-10-CM | POA: Diagnosis not present

## 2020-07-12 LAB — CBC
HCT: 29.8 % — ABNORMAL LOW (ref 39.0–52.0)
Hemoglobin: 9.4 g/dL — ABNORMAL LOW (ref 13.0–17.0)
MCH: 29.5 pg (ref 26.0–34.0)
MCHC: 31.5 g/dL (ref 30.0–36.0)
MCV: 93.4 fL (ref 80.0–100.0)
Platelets: 384 10*3/uL (ref 150–400)
RBC: 3.19 MIL/uL — ABNORMAL LOW (ref 4.22–5.81)
RDW: 13.5 % (ref 11.5–15.5)
WBC: 10.2 10*3/uL (ref 4.0–10.5)
nRBC: 0 % (ref 0.0–0.2)

## 2020-07-12 LAB — GLUCOSE, CAPILLARY
Glucose-Capillary: 130 mg/dL — ABNORMAL HIGH (ref 70–99)
Glucose-Capillary: 164 mg/dL — ABNORMAL HIGH (ref 70–99)
Glucose-Capillary: 332 mg/dL — ABNORMAL HIGH (ref 70–99)
Glucose-Capillary: 306 mg/dL — ABNORMAL HIGH (ref 70–99)

## 2020-07-12 LAB — PROTIME-INR
INR: 1.8 — ABNORMAL HIGH (ref 0.8–1.2)
Prothrombin Time: 20.5 seconds — ABNORMAL HIGH (ref 11.4–15.2)

## 2020-07-12 LAB — CORTISOL-AM, BLOOD: Cortisol - AM: 3.4 ug/dL — ABNORMAL LOW (ref 6.7–22.6)

## 2020-07-12 LAB — TSH: TSH: 0.636 u[IU]/mL (ref 0.350–4.500)

## 2020-07-12 MED ORDER — MIDODRINE HCL 5 MG PO TABS
5.0000 mg | ORAL_TABLET | Freq: Three times a day (TID) | ORAL | Status: DC
  Administered 2020-07-12 – 2020-07-13 (×5): 5 mg via ORAL
  Filled 2020-07-12 (×4): qty 1

## 2020-07-12 MED ORDER — INSULIN ASPART 100 UNIT/ML ~~LOC~~ SOLN
0.0000 [IU] | Freq: Every day | SUBCUTANEOUS | Status: DC
  Administered 2020-07-12: 4 [IU] via SUBCUTANEOUS
  Administered 2020-07-14: 2 [IU] via SUBCUTANEOUS

## 2020-07-12 MED ORDER — INSULIN ASPART 100 UNIT/ML ~~LOC~~ SOLN
3.0000 [IU] | Freq: Three times a day (TID) | SUBCUTANEOUS | Status: DC
  Administered 2020-07-13: 3 [IU] via SUBCUTANEOUS

## 2020-07-12 MED ORDER — INSULIN ASPART 100 UNIT/ML ~~LOC~~ SOLN
0.0000 [IU] | Freq: Three times a day (TID) | SUBCUTANEOUS | Status: DC
  Administered 2020-07-13: 5 [IU] via SUBCUTANEOUS
  Administered 2020-07-13 (×2): 8 [IU] via SUBCUTANEOUS
  Administered 2020-07-14: 5 [IU] via SUBCUTANEOUS
  Administered 2020-07-14: 8 [IU] via SUBCUTANEOUS
  Administered 2020-07-14: 3 [IU] via SUBCUTANEOUS
  Administered 2020-07-15 (×2): 8 [IU] via SUBCUTANEOUS

## 2020-07-12 MED ORDER — WARFARIN SODIUM 4 MG PO TABS
4.0000 mg | ORAL_TABLET | Freq: Once | ORAL | Status: AC
  Administered 2020-07-12: 4 mg via ORAL
  Filled 2020-07-12: qty 1

## 2020-07-12 NOTE — Progress Notes (Signed)
Physical Therapy Treatment Patient Details Name: Dennis [name]. MRN: [id number] DOB: 1961/08/04 Today's Date: 07/12/2020    History of Present Illness 59 yo male admitted with unsteady gait, slurred speech. Hx of CHF, DM, CVA, pacemaker, MDD, metastatic lung ca, polysubstance abuse    PT Comments    General Comments: AxO x 3 very nice and talkative about Football Assisted OOB to amb while taking Orthostatic vitals. Thigh High TEDS already on but very loose fitting.  Applied ABD binder.  Assisted OOB to amb. Orthostatics taken during session........Marland KitchenSee Epic vitals Pt tolerated amb 155 feet with no c/o dizziness.  "I feel fine"   General Gait Details: used a RW just for safety (pt most likely will not need once D/C'd) while monitoring vitals.  No c/o dizziness.  "I feel fine". BP did drop from 114/89 supine to 80/52 after 3 min.  HR only increased to 99 from 86 at rest. Will try gait with cane next visit.   Follow Up Recommendations  Supervision for mobility/OOB;Home health PT     Equipment Recommendations  Other (comment) (pt may need a cane)    Recommendations for Other Services       Precautions / Restrictions Precautions Precautions: Fall Precaution Comments: high fall risk! passed out with no warning signs with NT and on PT eval...Marland KitchenMarland KitchenMarland Kitchenorthostatic BP's    Mobility  Bed Mobility Overal bed mobility: Modified Independent             General bed mobility comments: pt self able with increased time  Transfers Overall transfer level: Needs assistance Equipment used: None Transfers: Sit to/from Stand Sit to Stand: Supervision;Min guard         General transfer comment: cues for safety, self monitor for dizziness as well as taking orthosatic BP's and using urinal upon standing.  No c/o dizziness.  Slight lateral sway.  Ambulation/Gait Ambulation/Gait assistance: Min guard Gait Distance (Feet): 155 Feet Assistive device: Rolling walker (2 wheeled) Gait  Pattern/deviations: Step-through pattern;Decreased stance time - left;Drifts right/left Gait velocity: decreased   General Gait Details: used a RW just for safety (pt most likely will not need once D/C'd) while monitoring vitals.  No c/o dizziness.  "I feel fine". BP did drop from 114/89 supine to 80/52 after 3 min.  HR only increased to 99 from 86 at rest.   Stairs             Wheelchair Mobility    Modified Rankin (Stroke Patients Only)       Balance                                            Cognition Arousal/Alertness: Awake/alert Behavior During Therapy: WFL for tasks assessed/performed Overall Cognitive Status: Within Functional Limits for tasks assessed                                 General Comments: AxO x 3 very nice and talkative about Football      Exercises      General Comments        Pertinent Vitals/Pain Pain Assessment: No/denies pain    Home Living                      Prior Function  PT Goals (current goals can now be found in the care plan section) Progress towards PT goals: Progressing toward goals    Frequency    Min 3X/week      PT Plan Current plan remains appropriate    Co-evaluation              AM-PAC PT "6 Clicks" Mobility   Outcome Measure  Help needed turning from your back to your side while in a flat bed without using bedrails?: None Help needed moving from lying on your back to sitting on the side of a flat bed without using bedrails?: A Little Help needed moving to and from a bed to a chair (including a wheelchair)?: A Little Help needed standing up from a chair using your arms (e.g., wheelchair or bedside chair)?: A Little Help needed to walk in hospital room?: A Little Help needed climbing 3-5 steps with a railing? : A Little 6 Click Score: 19    End of Session Equipment Utilized During Treatment: Gait belt Activity Tolerance: Patient tolerated  treatment well Patient left: in chair;with call bell/phone within reach;with chair alarm set Nurse Communication: Mobility status PT Visit Diagnosis: Difficulty in walking, not elsewhere classified (R26.2)     Time: 1308-6578 PT Time Calculation (min) (ACUTE ONLY): 24 min  Charges:  $Gait Training: 8-22 mins $Therapeutic Activity: 8-22 mins                     {Yennifer Segovia  PTA Acute  Rehabilitation Services Pager      [phone] Office      [phone]

## 2020-07-12 NOTE — Progress Notes (Addendum)
PHYSICAL THERAPY  Thigh High TEDS already on but very loose fitting.  Applied ABD binder.  Assisted OOB to amb. Orthostatics taken during session........Marland KitchenSee Epic vitals Pt tolerated amb 155 feet with no c/o dizziness.  "I feel fine"    Rica Koyanagi  PTA Acute  Rehabilitation Services Pager      [phone] Office      [phone]

## 2020-07-12 NOTE — Progress Notes (Signed)
Progress Note  Patient Name: Dennis [name]. Date of Encounter: 07/12/2020  [facility] Cardiologist: Glori Bickers, MD  Subjective   Patient refused orthostatics yesterday. Spoke to patient and he said he would be willing to repeat orthostatics today. Also informed the nurse. Apparently did not get up yesterday so no recurrent dizziness. Can try and get patient up today with assistance.   Inpatient Medications    Scheduled Meds: . aspirin EC  81 mg Oral Daily  . atorvastatin  20 mg Oral Daily  . dexamethasone  4 mg Oral Q12H  . insulin aspart  0-15 Units Subcutaneous TID AC & HS  . insulin glargine  15 Units Subcutaneous Q2200  . midodrine  2.5 mg Oral TID WC  . Warfarin - Pharmacist Dosing Inpatient   Does not apply q1600   Continuous Infusions:  PRN Meds: acetaminophen **OR** acetaminophen (TYLENOL) oral liquid 160 mg/5 mL **OR** acetaminophen, ondansetron (ZOFRAN) IV, polyethylene glycol   Vital Signs    Vitals:   07/12/20 0100 07/12/20 0200 07/12/20 0300 07/12/20 0400  BP:      Pulse:      Resp: 15 18 17 15   Temp:      TempSrc:      SpO2:      Weight:      Height:        Intake/Output Summary (Last 24 hours) at 07/12/2020 0816 Last data filed at 07/11/2020 1904 Gross per 24 hour  Intake 1080 ml  Output 525 ml  Net 555 ml   Last 3 Weights 07/08/2020 06/20/2020 05/23/2020  Weight (lbs) 137 lb 144 lb 14.4 oz 150 lb 8 oz  Weight (kg) 62.143 kg 65.726 kg 68.266 kg  Some encounter information is confidential and restricted. Go to Review Flowsheets activity to see all data.      Telemetry    NSR, PVCs, HR 80s - Personally Reviewed  ECG    No new - Personally Reviewed  Physical Exam   GEN: No acute distress.   Neck: No JVD Cardiac: RRR, no murmurs, rubs, or gallops.  Respiratory: Clear to auscultation bilaterally. GI: Soft, nontender, non-distended  MS: No edema; No deformity. Neuro:  Nonfocal  Psych: Normal affect   Labs    High Sensitivity  Troponin:  No results for input(s): TROPONINIHS in the last 720 hours.    Chemistry Recent Labs  Lab 07/07/20 1315 07/07/20 1315 07/08/20 0542 07/08/20 0542 07/09/20 1039 07/10/20 0451 07/11/20 0509  NA 136   < > 133*   < > 138 136 137  K 4.6   < > 3.6   < > 4.1 3.8 4.6  CL 95*   < > 99   < > 102 103 103  CO2 28   < > 24   < > 26 24 25   GLUCOSE 188*   < > 147*   < > 139* 65* 97  BUN 23*   < > 21*   < > 17 18 18   CREATININE 1.33*   < > 1.15   < > 1.14 0.96 0.95  CALCIUM 9.7   < > 8.8*   < > 8.7* 8.6* 9.1  PROT 7.4  --  6.8  --   --   --   --   ALBUMIN 3.2*  --  3.0*  --   --   --   --   AST 20  --  18  --   --   --   --  ALT 17  --  16  --   --   --   --   ALKPHOS 88  --  83  --   --   --   --   BILITOT 0.8  --  0.7  --   --   --   --   GFRNONAA 59*   < > >60   < > >60 >60 >60  GFRAA >60   < > >60   < > >60 >60 >60  ANIONGAP 13   < > 10   < > 10 9 9    < > = values in this interval not displayed.     Hematology Recent Labs  Lab 07/10/20 0451 07/11/20 0509 07/12/20 0504  WBC 7.4 6.7 10.2  RBC 2.50* 3.26* 3.19*  HGB 7.2* 9.3* 9.4*  HCT 22.5* 31.0* 29.8*  MCV 90.0 95.1 93.4  MCH 28.8 28.5 29.5  MCHC 32.0 30.0 31.5  RDW 13.3 13.6 13.5  PLT 332 352 384    BNPNo results for input(s): BNP, PROBNP in the last 168 hours.   DDimer No results for input(s): DDIMER in the last 168 hours.   Radiology    CT HEAD W & WO CONTRAST  Result Date: 07/10/2020 CLINICAL DATA:  Small cell lung cancer. History of treated metastasis right frontal lobe. EXAM: CT HEAD WITHOUT AND WITH CONTRAST TECHNIQUE: Contiguous axial images were obtained from the base of the skull through the vertex without and with intravenous contrast CONTRAST:  65mL OMNIPAQUE IOHEXOL 300 MG/ML  SOLN COMPARISON:  CT head without with contrast 07/07/2020. CT head with contrast 12/23/2019 FINDINGS: Brain: Right frontal metastasis seen on the prior study of 12/23/2019 has resolved. No residual enhancing lesion in the  right frontal lobe. There is hypodensity and volume loss in the right insula and frontal lobe compatible with chronic infarct which is unchanged. There is patchy increased density in the ventral pons and proximal medulla postcontrast which appears to be actual enhancement rather than artifact. This is not seen on the precontrast study. Of 07/07/2020 this is similar to the recent CT but not present on 12/23/2019. This area is obscured by streak artifact however is concerning for metastatic disease. No other enhancing lesions identified. Negative for intracranial hemorrhage. No hydrocephalus or midline shift. No acute infarct. Vascular: Negative for hyperdense vessel Skull: No skeletal lesion identified. Sinuses/Orbits: Paranasal sinuses clear.  Negative orbit Other: None IMPRESSION: Treated lesion in the right frontal lobe no longer present without residual enhancement. There appears to be infiltrating enhancement in the ventral pons and proximal medulla concerning for metastatic disease. This is a relatively extensive area of enhancement. Review of the recent CT 3 days ago has a similar appearance. This would be better evaluated by MRI but apparently the patient is not able to have an MRI. Patient has a pacemaker. Correlate with pacemaker manufacture and model for MRI compatibility. Chronic right frontal infarct.  No intracranial hemorrhage. These results were called by telephone at the time of interpretation on 07/10/2020 at 4:00 pm to provider [facility] , who verbally acknowledged these results. Electronically Signed   By: Franchot Gallo M.D.   On: 07/10/2020 16:01    Cardiac Studies   2D echo 07/09/2020  IMPRESSIONS    1. Since the last exam on 12/25/2019 LVEF has slightly improved from  15-20% to 25-30% with diffuse hypokinesis.  2. Left ventricular ejection fraction, by estimation, is 25 to 30%. The  left ventricle has severely decreased function. The left  ventricle  demonstrates global  hypokinesis. The left ventricular internal cavity size  was moderately dilated. There is mild  concentric left ventricular hypertrophy. Left ventricular diastolic  parameters are consistent with Grade II diastolic dysfunction  (pseudonormalization). Elevated left atrial pressure.  3. Right ventricular systolic function is normal. The right ventricular  size is normal. There is normal pulmonary artery systolic pressure.  4. A small pericardial effusion is present. The pericardial effusion is  posterior to the left ventricle.  5. The mitral valve is normal in structure. Mild mitral valve  regurgitation. No evidence of mitral stenosis.  6. The aortic valve is normal in structure. Aortic valve regurgitation is  trivial. No aortic stenosis is present.  7. The inferior vena cava is normal in size with greater than 50%  respiratory variability, suggesting right atrial pressure of 3 mmHg.   Patient Profile     59 y.o. 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 dual chamber ICD, stroke in 4/ 2016, hyperlipidemia, type 2 diabetes mellitus, former polysubstance abuse (tobacco, cocaine, and alcohol), and non-compliancewho is being seen today for the evaluation of recurrent syncope.  Assessment & Plan   Recurrent Syncope - Pt reported recurrent syncope for the last 2 months. Episodes this admission during voiding. Vasovagal vs orthostatic - Tele with no ventricular arrhythmias - Echo showed improved EF, from 15-20% to 25-30% with G2DD, mild MR, and small PE - Carotid dopplers with 1-39% disease B/L - Orthostatics positive - ICD showed no significant arrhythmias, only tachycardia - With thigh high compression hose and abdominal binder - Patient had some AMS and CT head with  contrast ordered which showed infiltrating enhancement in the ventral pons concerning for metastatic disease, no hemorrhage. Recommending MRI however has pacemaker which is not MRI approved (confirmed with [employer] rep).  - Oncology following>started Decadron - continue midodrine. Try to repeat orthostatics and monitor symptoms.   Mixed Ischemic/NICM CM Chronic systolic CHF s/p ICD ([employer] Jude dual chamber ICD) - Non-compaction CM - Echo 12/2019 showed LVEF 20-25% with global hypokinesis and moderate asymmetric LV hypertrophy on inferolateral segment - Repeat echo showed slight improvement in LV with EF 25-30% - Patient appears euvolemic on exam - home meds including entresto, coreg, spiro held for recurrent syncope/hypotension - on chronic coumadin for non-compaction CM  CAD - Cath in 2010 with occluded LAD with collaterals - Myoview in 2019 showed prior infarct but no ischemia - EKG shows no acute changes - Denies chest pain - continue aspirin and statin  Possible prolonged QTc - EKG 9/27 QTc 418ms  HLD - LDL 77, goal<70 - continue lipitor  DM2 - this is poorly controlled with A1C 10.5 - per IM  Substance abuse - UDS positive for cocaine  Small cell lung cancer with brain mets - Oncology started Decadrone For questions or updates, please contact [facility] Please consult [email or web address] for contact info under        Signed, Zidane Renner Ninfa Meeker, PA-C  07/12/2020, 8:16 AM

## 2020-07-12 NOTE — Progress Notes (Signed)
PROGRESS NOTE  Dennis [name]. KPV:[id number] DOB: May 06, 1961   PCP: [name], MD  Patient is from: Home  DOA: 07/07/2020 LOS: 3  Brief Narrative / Interim history: 59 year old male with history of right MCA CVA, small cell lung cancer with brain mets s/p whole brain radiation, chronic CHF s/p ICD in 12/2019, IDDM-2 and cocaine use presenting with unsteady gait, slurred speech and LUE numbness admitted with concern for acute on chronic CVA.  CT head without contrast without acute finding.  Neurology consulted.  Could not do MRI due to ICD.  Repeat CT head with contrast did not reveal CVA but concern about infiltrating enhancement in ventral pons and proximal medulla concerning of metastatic disease.  EEG without epileptiform or seizure activity.  Patient had recurrent syncope with micturition raising concern for vasovagal syncope.  However, his orthostatic vitals were also positive.  Cardiology consulted.  He was started on low-dose midodrine.  Equal wheeze EF of 25 to 30% (previously 15 to 20%), diffuse hypokinesis G2-DD.  Oncology and radiation oncology following as well  Subjective: Seen and examined earlier this morning.  No major events overnight of this morning.  Reports improvement in his numbness/tingling.  Patient reports residual weakness and numbness from previous stroke.  Unsure how much of his symptoms are new.  He also reports hesitancy and sense of incomplete void for months.  He has never seen urology.  He denies chest pain, dyspnea, lightheadedness or GI symptoms.  He denies dysuria or hematuria.  Objective: Vitals:   07/12/20 0100 07/12/20 0200 07/12/20 0300 07/12/20 0400  BP:      Pulse:      Resp: 15 18 17 15   Temp:      TempSrc:      SpO2:      Weight:      Height:        Intake/Output Summary (Last 24 hours) at 07/12/2020 1339 Last data filed at 07/12/2020 1325 Gross per 24 hour  Intake 1320 ml  Output 525 ml  Net 795 ml   Filed Weights   07/08/20 0337    Weight: 62.1 kg    Examination:  GENERAL: No apparent distress.  Nontoxic. HEENT: MMM.  Vision and hearing grossly intact.  NECK: Supple.  No apparent JVD.  RESP: On room air.  No IWOB.  Fair aeration bilaterally. CVS:  RRR. Heart sounds normal.  ABD/GI/GU: BS+. Abd soft, NTND.  MSK/EXT:  Moves extremities. No apparent deformity. No edema.  SKIN: no apparent skin lesion or wound NEURO: Awake, alert and oriented appropriately.  Mild dysarthria.  Otherwise cranial nerves grossly intact.  Motor 4/5 in LUE, 4+/5 elsewhere.  Light sensation intact in all dermatomes.  Patellar reflex symmetric.  Notable pronator drift on the left. PSYCH: Calm. Normal affect.  Procedures:  None  Microbiology summarized: COVID-19 PCR negative. Influenza PCR negative.  Assessment & Plan: Slurred speech/LUE paresthesia/unsteady gait in patient with prior right MCA CVA-CT head with and without contrast did not reveal CVA but possible pontine and medullary infiltrating lesion concerning for metastatic disease.  Unfortunately, his ICD is incompatible with MRI.  Echocardiogram EF of 25 to 30% (improved).  EEG without seizure activity.  UDS positive for cocaine.  A1c 10.5%.  LDL 77. -Continue aspirin and warfarin -Emphasize risk reduction -SLP/PT/OT  Chronic systolic CHF s/p AICD in 03/7543-BE chest pain or dyspnea but reports orthopnea.  Appears euvolemic.  -Cardiology managing -GDMT-Home Delene Loll, Coreg and Aldactone on hold due to syncope and orthostatic hypotension.  Recurrent syncope/orthostatic hypotension-orthostatic vitals positive.  Also some concern about vasovagal and autonomic dysfunction. He reports some hesitancy and sense of incomplete void for months.  Never seen urology.  TTE as above. -Cardiac meds on hold -TED hose and midodrine per cardiology -Agree with a.m. cortisol and repeat TSH -Check orthostatic vitals daily  History of CAD with occluded LAD with collaterals-no anginal  symptoms. -Continue aspirin and statin  Small cell lung cancer with brain mets s/p whole brain irradiation: CT head with and without contrast concerning for pontine and medullary infiltrate.  -Oncology following-started on Decadron 4 mg every 12 hours -Radiation oncology consulted by oncology.  Substance abuse/recurrent cocaine abuse: UDS positive for cocaine. -Counseled on the importance of quitting cocaine use -TOC consulted.  Essential hypertension: Now with orthostatic hypotension -Cardiac meds and midodrine as above  Uncontrolled DM-2 with hyperglycemia: Fluctuating CBG.  A1c 10.5%.  History of noncompliance. Recent Labs  Lab 07/11/20 1153 07/11/20 1723 07/11/20 2041 07/12/20 0735 07/12/20 1134  GLUCAP 262* 355* 255* 130* 164*  -Continue SSI-moderate on Lantus 15 units nightly -Continue statin  Hyperlipidemia: LDL 77. -Continue with Lipitor 20 mg daily.  Lethargy/possible delirium: Reportedly had waxing and waning mental status.  Seems to have resolved.  -Avoid sedating medications -Reorientation and delirium precautions  Anemia of chronic disease: H&H stable at baseline.  Anemia panel basically normal. -Continue monitoring  Debility/physical deconditioning -PT/OT   Body mass index is 18.58 kg/m.         DVT prophylaxis:  Place TED hose Start: 07/10/20 1054 SCD's Start: 07/08/20 0127    Code Status: Full code Family Communication: Patient and/or RN. Available if any question.  Status is: Inpatient  Remains inpatient appropriate because:Hemodynamically unstable, Unsafe d/c plan and Inpatient level of care appropriate due to severity of illness   Dispo: The patient is from: Home              Anticipated d/c is to: Home              Anticipated d/c date is: 2 days              Patient currently is not medically stable to d/c.       Consultants:  Cardiology Oncology Neurology Radiation oncology   Sch Meds:  Scheduled Meds: . aspirin  EC  81 mg Oral Daily  . atorvastatin  20 mg Oral Daily  . dexamethasone  4 mg Oral Q12H  . insulin aspart  0-15 Units Subcutaneous TID AC & HS  . insulin glargine  15 Units Subcutaneous Q2200  . midodrine  5 mg Oral TID WC  . Warfarin - Pharmacist Dosing Inpatient   Does not apply q1600   Continuous Infusions: PRN Meds:.acetaminophen **OR** acetaminophen (TYLENOL) oral liquid 160 mg/5 mL **OR** acetaminophen, ondansetron (ZOFRAN) IV, polyethylene glycol  Antimicrobials: Anti-infectives (From admission, onward)   None       I have personally reviewed the following labs and images: CBC: Recent Labs  Lab 07/08/20 0542 07/09/20 1039 07/10/20 0451 07/11/20 0509 07/12/20 0504  WBC 6.5 6.6 7.4 6.7 10.2  NEUTROABS  --  4.4  --  5.5  --   HGB 8.8* 7.5* 7.2* 9.3* 9.4*  HCT 28.1* 24.4* 22.5* 31.0* 29.8*  MCV 91.2 94.2 90.0 95.1 93.4  PLT 307 323 332 352 384   BMP &GFR Recent Labs  Lab 07/07/20 1315 07/08/20 0542 07/09/20 1039 07/10/20 0451 07/11/20 0509  NA 136 133* 138 136 137  K 4.6 3.6 4.1  3.8 4.6  CL 95* 99 102 103 103  CO2 28 24 26 24 25   GLUCOSE 188* 147* 139* 65* 97  BUN 23* 21* 17 18 18   CREATININE 1.33* 1.15 1.14 0.96 0.95  CALCIUM 9.7 8.8* 8.7* 8.6* 9.1   Estimated Creatinine Clearance: 74.4 mL/min (by C-G formula based on SCr of 0.95 mg/dL). Liver & Pancreas: Recent Labs  Lab 07/07/20 1315 07/08/20 0542  AST 20 18  ALT 17 16  ALKPHOS 88 83  BILITOT 0.8 0.7  PROT 7.4 6.8  ALBUMIN 3.2* 3.0*   No results for input(s): LIPASE, AMYLASE in the last 168 hours. Recent Labs  Lab 07/10/20 1057  AMMONIA 26   Diabetic: No results for input(s): HGBA1C in the last 72 hours. Recent Labs  Lab 07/11/20 1153 07/11/20 1723 07/11/20 2041 07/12/20 0735 07/12/20 1134  GLUCAP 262* 355* 255* 130* 164*   Cardiac Enzymes: No results for input(s): CKTOTAL, CKMB, CKMBINDEX, TROPONINI in the last 168 hours. No results for input(s): PROBNP in the last 8760  hours. Coagulation Profile: Recent Labs  Lab 07/08/20 0542 07/09/20 1210 07/10/20 0451 07/11/20 0509 07/12/20 0504  INR 1.1 1.2 1.4* 1.3* 1.8*   Thyroid Function Tests: Recent Labs    07/12/20 1129  TSH 0.636   Lipid Profile: No results for input(s): CHOL, HDL, LDLCALC, TRIG, CHOLHDL, LDLDIRECT in the last 72 hours. Anemia Panel: Recent Labs    07/10/20 0450 07/10/20 1239  VITAMINB12  --  924*  FOLATE  --  6.9  FERRITIN  --  448*  TIBC  --  202*  IRON  --  65  RETICCTPCT 2.1  --    Urine analysis:    Component Value Date/Time   COLORURINE YELLOW 07/07/2020 1628   APPEARANCEUR CLEAR 07/07/2020 1628   LABSPEC 1.014 07/07/2020 1628   PHURINE 5.0 07/07/2020 1628   GLUCOSEU 50 (A) 07/07/2020 1628   HGBUR NEGATIVE 07/07/2020 1628   BILIRUBINUR NEGATIVE 07/07/2020 1628   KETONESUR NEGATIVE 07/07/2020 1628   PROTEINUR 100 (A) 07/07/2020 1628   UROBILINOGEN 1.0 07/16/2015 1715   NITRITE NEGATIVE 07/07/2020 1628   LEUKOCYTESUR NEGATIVE 07/07/2020 1628   Sepsis Labs: Invalid input(s): PROCALCITONIN, [place]  Microbiology: Recent Results (from the past 240 hour(s))  Respiratory Panel by RT PCR (Flu A&B, Covid) - Nasopharyngeal Swab     Status: None   Collection Time: 07/07/20  9:15 PM   Specimen: Nasopharyngeal Swab  Result Value Ref Range Status   SARS Coronavirus 2 by RT PCR NEGATIVE NEGATIVE Final    Comment: (NOTE) SARS-CoV-2 target nucleic acids are NOT DETECTED.  The SARS-CoV-2 RNA is generally detectable in upper respiratoy specimens during the acute phase of infection. The lowest concentration of SARS-CoV-2 viral copies this assay can detect is 131 copies/mL. A negative result does not preclude SARS-Cov-2 infection and should not be used as the sole basis for treatment or other patient management decisions. A negative result may occur with  improper specimen collection/handling, submission of specimen other than nasopharyngeal swab, presence of viral  mutation(s) within the areas targeted by this assay, and inadequate number of viral copies (<131 copies/mL). A negative result must be combined with clinical observations, patient history, and epidemiological information. The expected result is Negative.  Fact Sheet for Patients:  PinkCheek.be  Fact Sheet for Healthcare Providers:  GravelBags.it  This test is no t yet approved or cleared by the [place] FDA and  has been authorized for detection and/or diagnosis of SARS-CoV-2 by FDA under an  Emergency Use Authorization (EUA). This EUA will remain  in effect (meaning this test can be used) for the duration of the COVID-19 declaration under Section 564(b)(1) of the Act, 21 U.S.C. section 360bbb-3(b)(1), unless the authorization is terminated or revoked sooner.     Influenza A by PCR NEGATIVE NEGATIVE Final   Influenza B by PCR NEGATIVE NEGATIVE Final    Comment: (NOTE) The Xpert Xpress SARS-CoV-2/FLU/RSV assay is intended as an aid in  the diagnosis of influenza from Nasopharyngeal swab specimens and  should not be used as a sole basis for treatment. Nasal washings and  aspirates are unacceptable for Xpert Xpress SARS-CoV-2/FLU/RSV  testing.  Fact Sheet for Patients: PinkCheek.be  Fact Sheet for Healthcare Providers: GravelBags.it  This test is not yet approved or cleared by the [place] FDA and  has been authorized for detection and/or diagnosis of SARS-CoV-2 by  FDA under an Emergency Use Authorization (EUA). This EUA will remain  in effect (meaning this test can be used) for the duration of the  Covid-19 declaration under Section 564(b)(1) of the Act, 21  U.S.C. section 360bbb-3(b)(1), unless the authorization is  terminated or revoked. Performed at [facility], [place] [address], [place], [place] [address]     Radiology  Studies: No results found.   Vivika Poythress T. [place]  If 7PM-7AM, please contact night-coverage [email or web address] 07/12/2020, 1:39 PM

## 2020-07-12 NOTE — Progress Notes (Signed)
We were notified that the patient is currently in the hospital.  He recently completed a course of whole brain radiation approximately 5 months ago where his whole brain was treated to 30 Gy in 10 fractions.  Unfortunately the patient does have a ICD that is not MRI compatible, it is a Environmental health practitioner fortify DR 223 1-40 Q device, I spoke with Junie Panning in MRI and he confirmed that this is not a compatible device.  Patient is currently hospitalized and was found to have an infiltrating enhancement of the ventral pons and proximal medulla concerning for disease however this is relatively extensive, and the limitations of not being able to proceed with MRI prevent [place] from being able to confidently assess the this area is truly disease.  Given the recency of his brain treatment, Dr. Lisbeth Renshaw would like to review his case in our multidisciplinary brain and spine oncology conference Monday morning prior to making any further determination about options of radiotherapy.     Carola Rhine, PAC

## 2020-07-12 NOTE — Progress Notes (Addendum)
[place] for Warfarin Indication: advanced (noncompaction) cardiomyopathy  No Known Allergies  Patient Measurements: Height: 6' (182.9 cm) Weight: 62.1 kg (137 lb) IBW/kg (Calculated) : 77.6  Vital Signs:    Labs: Recent Labs    07/10/20 0451 07/10/20 0451 07/11/20 0509 07/12/20 0504  HGB 7.2*   < > 9.3* 9.4*  HCT 22.5*  --  31.0* 29.8*  PLT 332  --  352 384  LABPROT 16.4*  --  15.6* 20.5*  INR 1.4*  --  1.3* 1.8*  CREATININE 0.96  --  0.95  --    < > = values in this interval not displayed.    Estimated Creatinine Clearance: 74.4 mL/min (by C-G formula based on SCr of 0.95 mg/dL).  Medications:  Scheduled:  . aspirin EC  81 mg Oral Daily  . atorvastatin  20 mg Oral Daily  . dexamethasone  4 mg Oral Q12H  . insulin aspart  0-15 Units Subcutaneous TID AC & HS  . insulin glargine  15 Units Subcutaneous Q2200  . midodrine  5 mg Oral TID WC  . 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 Per med rec and chart records on visit on 9/3, pt is on Warfarin 7.5 mg On Tues/Thur/Sat and 3.75 mg on all other days. Baseline INR subtherapeutic on admission; possible noncompliance.  Today, 07/12/20   INR remains low but trended up significantly overnight  Hgb low but improved after PRBC; Plt stable WNL  CT head negative for hemorrhage  No major drug-drug interactions with warfarin; continues on concomitant ASA from PTA  Eating 100% of meals per RN  Goal of Therapy:  INR 2-3 Monitor platelets by anticoagulation protocol: Yes   Plan:   Warfarin 4 mg PO x1 tonight  Daily INR   For discharge, recommend resuming PTA dosing with INR check in 3-5 days  Monitor for signs and symptoms of bleeding  Reuel Boom, PharmD, BCPS [phone] 07/12/2020, 1:46 PM

## 2020-07-13 DIAGNOSIS — I951 Orthostatic hypotension: Secondary | ICD-10-CM

## 2020-07-13 DIAGNOSIS — R471 Dysarthria and anarthria: Secondary | ICD-10-CM | POA: Diagnosis not present

## 2020-07-13 DIAGNOSIS — E440 Moderate protein-calorie malnutrition: Secondary | ICD-10-CM

## 2020-07-13 DIAGNOSIS — F1490 Cocaine use, unspecified, uncomplicated: Secondary | ICD-10-CM | POA: Diagnosis not present

## 2020-07-13 DIAGNOSIS — C3490 Malignant neoplasm of unspecified part of unspecified bronchus or lung: Secondary | ICD-10-CM

## 2020-07-13 DIAGNOSIS — Z5181 Encounter for therapeutic drug level monitoring: Secondary | ICD-10-CM

## 2020-07-13 LAB — PROTIME-INR
INR: 2.1 — ABNORMAL HIGH (ref 0.8–1.2)
Prothrombin Time: 23.2 seconds — ABNORMAL HIGH (ref 11.4–15.2)

## 2020-07-13 LAB — GLUCOSE, CAPILLARY
Glucose-Capillary: 265 mg/dL — ABNORMAL HIGH (ref 70–99)
Glucose-Capillary: 294 mg/dL — ABNORMAL HIGH (ref 70–99)
Glucose-Capillary: 224 mg/dL — ABNORMAL HIGH (ref 70–99)
Glucose-Capillary: 219 mg/dL — ABNORMAL HIGH (ref 70–99)

## 2020-07-13 MED ORDER — INSULIN GLARGINE 100 UNIT/ML ~~LOC~~ SOLN
20.0000 [IU] | Freq: Every day | SUBCUTANEOUS | Status: DC
  Administered 2020-07-14: 20 [IU] via SUBCUTANEOUS
  Filled 2020-07-13: qty 0.2

## 2020-07-13 MED ORDER — INSULIN ASPART 100 UNIT/ML ~~LOC~~ SOLN
6.0000 [IU] | Freq: Three times a day (TID) | SUBCUTANEOUS | Status: DC
  Administered 2020-07-13 (×2): 6 [IU] via SUBCUTANEOUS

## 2020-07-13 MED ORDER — WARFARIN SODIUM 5 MG PO TABS
5.0000 mg | ORAL_TABLET | Freq: Once | ORAL | Status: AC
  Administered 2020-07-13: 5 mg via ORAL
  Filled 2020-07-13: qty 1

## 2020-07-13 MED ORDER — ENSURE ENLIVE PO LIQD
237.0000 mL | Freq: Two times a day (BID) | ORAL | Status: DC
  Administered 2020-07-14 – 2020-07-25 (×21): 237 mL via ORAL

## 2020-07-13 NOTE — Progress Notes (Signed)
ANTICOAGULATION CONSULT NOTE  Pharmacy Consult for Warfarin Indication: advanced (noncompaction) cardiomyopathy  No Known Allergies  Patient Measurements: Height: 6' (182.9 cm) Weight: 62.1 kg (137 lb) IBW/kg (Calculated) : 77.6  Vital Signs: Temp: 97.5 F (36.4 C) (10/01 1243) Temp Source: Oral (10/01 0128) BP: 110/89 (10/01 1243) Pulse Rate: 84 (10/01 1243)  Labs: Recent Labs    07/11/20 0509 07/12/20 0504 07/13/20 0519  HGB 9.3* 9.4*  --   HCT 31.0* 29.8*  --   PLT 352 384  --   LABPROT 15.6* 20.5* 23.2*  INR 1.3* 1.8* 2.1*  CREATININE 0.95  --   --     Estimated Creatinine Clearance: 74.4 mL/min (by C-G formula based on SCr of 0.95 mg/dL).  Medications:  Scheduled:  . aspirin EC  81 mg Oral Daily  . atorvastatin  20 mg Oral Daily  . dexamethasone  4 mg Oral Q12H  . insulin aspart  0-15 Units Subcutaneous TID WC  . insulin aspart  0-5 Units Subcutaneous QHS  . insulin aspart  6 Units Subcutaneous TID WC  . insulin glargine  15 Units Subcutaneous Q2200  . midodrine  5 mg Oral TID WC  . 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 Per med rec and chart records on visit on 9/3, pt is on Warfarin 7.5 mg On Tues/Thur/Sat and 3.75 mg on all other days. Baseline INR subtherapeutic on admission; possible noncompliance.  Today, 07/13/20   INR now therapeutic  Hgb low but improved after PRBC; Plt stable WNL  CT head negative for hemorrhage  No major drug-drug interactions with warfarin; continues on concomitant ASA from PTA  Eating 100% of meals per RN  Goal of Therapy:  INR 2-3 Monitor platelets by anticoagulation protocol: Yes   Plan:   Warfarin 5 mg PO x1 tonight  Daily INR   For discharge, recommend resuming PTA dosing with INR check in 3-5 days  Monitor for signs and symptoms of bleeding  Ulice Dash, PharmD, BCPS [phone] 07/13/2020, 12:51 PM

## 2020-07-13 NOTE — Progress Notes (Signed)
PROGRESS NOTE  Dennis [name]. LHT:[id number] DOB: 17-Sep-1961   PCP: [name], MD  Patient is from: Home  DOA: 07/07/2020 LOS: 4  Brief Narrative / Interim history: 59 year old male with history of right MCA CVA, small cell lung cancer with brain mets s/p whole brain radiation, chronic CHF s/p ICD in 12/2019, IDDM-2 and cocaine use presenting with unsteady gait, slurred speech and LUE numbness admitted with concern for acute on chronic CVA.  CT head without contrast without acute finding.  Neurology consulted.  Could not do MRI due to ICD.  Repeat CT head with contrast did not reveal CVA but concern about infiltrating enhancement in ventral pons and proximal medulla concerning of metastatic disease.  EEG without epileptiform or seizure activity.  Patient had recurrent syncope with micturition raising concern for vasovagal syncope.  However, his orthostatic vitals were also positive.  Cardiology consulted.  He was started on low-dose midodrine.  Equal wheeze EF of 25 to 30% (previously 15 to 20%), diffuse hypokinesis G2-DD.  Oncology and radiation oncology following as well  Subjective: Seen and examined earlier this morning.  No major events overnight of this morning.  No complaints.  His neuro symptoms improved.  He denies chest pain, shortness of breath, palpitation or dizziness.  He did not feel lightheaded when he got up with therapy this morning.  Objective: Vitals:   07/12/20 0300 07/12/20 0400 07/12/20 1300 07/13/20 0128  BP:   118/81 101/79  Pulse:   82 85  Resp: 17 15 18 16   Temp:   97.9 F (36.6 C) (!) 97.5 F (36.4 C)  TempSrc:   Oral Oral  SpO2:   100% 98%  Weight:      Height:        Intake/Output Summary (Last 24 hours) at 07/13/2020 1222 Last data filed at 07/13/2020 0900 Gross per 24 hour  Intake 480 ml  Output 600 ml  Net -120 ml   Filed Weights   07/08/20 0337  Weight: 62.1 kg    Examination:  GENERAL: No apparent distress.  Nontoxic. HEENT: MMM.   Vision and hearing grossly intact.  NECK: Supple.  No apparent JVD.  RESP: On room air.  No IWOB.  Fair aeration bilaterally. CVS:  RRR. Heart sounds normal.  ABD/GI/GU: BS+. Abd soft, NTND.  Abdominal binder MSK/EXT:  Moves extremities.  Significant muscle mass and subcu fat loss.  TED hose SKIN: no apparent skin lesion or wound NEURO: Awake, alert and oriented appropriately.  Motor 4/5 in LUE, 4+/5 elsewhere.  Light sensation grossly intact.  Mild pronator drift on the left. PSYCH: Calm. Normal affect.  Procedures:  None  Microbiology summarized: COVID-19 PCR negative. Influenza PCR negative.  Assessment & Plan: Slurred speech/LUE paresthesia/unsteady gait in patient with prior right MCA CVA-CT head with and without contrast did not reveal CVA but possible pontine and medullary infiltrating lesion concerning for metastatic disease.  Unfortunately, his ICD is incompatible with MRI.  Echo EF of 25 to 30% (improved).  EEG without seizure activity.  UDS positive for cocaine.  INR was subtherapeutic on admit.  A1c 10.5%.  LDL 77. -Continue aspirin and warfarin-INR therapeutic. -Emphasize risk reduction -SLP/PT/OT-recommended home health and DME  Chronic systolic CHF s/p AICD in 02/7261-MB chest pain or dyspnea but reports orthopnea.  Appears euvolemic.  -Cardiology managing -GDMT-Home Delene Loll, Coreg and Aldactone on hold due to syncope and orthostatic hypotension.  Recurrent syncope/orthostatic hypotension-orthostatic vitals positive.  Also some concern about vasovagal and autonomic dysfunction. He reports some hesitancy  and sense of incomplete void for months.  Never seen urology.  TTE as above.  Cortisol low but difficult to interpret while he is on dexamethasone.  TSH normal. -Cardiac meds on hold -Abdominal binder, TED hose and midodrine per cardiology -Check orthostatic vitals daily  History of CAD with occluded LAD with collaterals-no anginal symptoms. -Continue aspirin and statin   Small cell lung cancer with brain mets s/p whole brain irradiation: CT head with and without contrast concerning for pontine and medullary infiltrate.  -Oncology following-started on Decadron 4 mg every 12 hours -Radiation oncology consulted by oncology-plan for multidisciplinary conference on 07/16/2020  Substance abuse/recurrent cocaine abuse: UDS positive for cocaine. -Counseled on the importance of quitting cocaine use -TOC consulted.  Essential hypertension: Now with orthostatic hypotension -Cardiac meds and midodrine as above  Uncontrolled DM-2 with hyperglycemia: Fluctuating CBG.  A1c 10.5%.  History of noncompliance. Recent Labs  Lab 07/12/20 0735 07/12/20 1134 07/12/20 1634 07/12/20 2132 07/13/20 0748  GLUCAP 130* 164* 332* 306* 265*  -Continue SSI-moderate on Lantus 15 units nightly -Increased NovoLog from 3 to 6 units AC -Continue statin  Hyperlipidemia: LDL 77. -Continue with Lipitor 20 mg daily.  Lethargy/possible delirium: Resolved. -Avoid sedating medications -Reorientation and delirium precautions  Anemia of chronic disease: H&H stable at baseline.  Anemia panel basically normal. -Continue monitoring  Debility/physical deconditioning -PT/OT  Moderate malnutrition-as evidenced by low BMI and significant muscle mass and subcu fat loss. -Consult dietitian Body mass index is 18.58 kg/m.         DVT prophylaxis:  Place TED hose Start: 07/10/20 1054 SCD's Start: 07/08/20 0127    Code Status: Full code Family Communication: Patient and/or RN.  Attempted to call patient's mother for update but no answer. Status is: Inpatient  Remains inpatient appropriate because:Hemodynamically unstable, Unsafe d/c plan and Inpatient level of care appropriate due to severity of illness   Dispo: The patient is from: Home              Anticipated d/c is to: Home              Anticipated d/c date is: 3 days              Patient currently is not medically  stable to d/c.       Consultants:  Cardiology Oncology Neurology Radiation oncology   Sch Meds:  Scheduled Meds: . aspirin EC  81 mg Oral Daily  . atorvastatin  20 mg Oral Daily  . dexamethasone  4 mg Oral Q12H  . insulin aspart  0-15 Units Subcutaneous TID WC  . insulin aspart  0-5 Units Subcutaneous QHS  . insulin aspart  6 Units Subcutaneous TID WC  . insulin glargine  15 Units Subcutaneous Q2200  . midodrine  5 mg Oral TID WC  . Warfarin - Pharmacist Dosing Inpatient   Does not apply q1600   Continuous Infusions: PRN Meds:.acetaminophen **OR** acetaminophen (TYLENOL) oral liquid 160 mg/5 mL **OR** acetaminophen, ondansetron (ZOFRAN) IV, polyethylene glycol  Antimicrobials: Anti-infectives (From admission, onward)   None       I have personally reviewed the following labs and images: CBC: Recent Labs  Lab 07/08/20 0542 07/09/20 1039 07/10/20 0451 07/11/20 0509 07/12/20 0504  WBC 6.5 6.6 7.4 6.7 10.2  NEUTROABS  --  4.4  --  5.5  --   HGB 8.8* 7.5* 7.2* 9.3* 9.4*  HCT 28.1* 24.4* 22.5* 31.0* 29.8*  MCV 91.2 94.2 90.0 95.1 93.4  PLT 307 323 332 352  384   BMP &GFR Recent Labs  Lab 07/07/20 1315 07/08/20 0542 07/09/20 1039 07/10/20 0451 07/11/20 0509  NA 136 133* 138 136 137  K 4.6 3.6 4.1 3.8 4.6  CL 95* 99 102 103 103  CO2 28 24 26 24 25   GLUCOSE 188* 147* 139* 65* 97  BUN 23* 21* 17 18 18   CREATININE 1.33* 1.15 1.14 0.96 0.95  CALCIUM 9.7 8.8* 8.7* 8.6* 9.1   Estimated Creatinine Clearance: 74.4 mL/min (by C-G formula based on SCr of 0.95 mg/dL). Liver & Pancreas: Recent Labs  Lab 07/07/20 1315 07/08/20 0542  AST 20 18  ALT 17 16  ALKPHOS 88 83  BILITOT 0.8 0.7  PROT 7.4 6.8  ALBUMIN 3.2* 3.0*   No results for input(s): LIPASE, AMYLASE in the last 168 hours. Recent Labs  Lab 07/10/20 1057  AMMONIA 26   Diabetic: No results for input(s): HGBA1C in the last 72 hours. Recent Labs  Lab 07/12/20 0735 07/12/20 1134 07/12/20  1634 07/12/20 2132 07/13/20 0748  GLUCAP 130* 164* 332* 306* 265*   Cardiac Enzymes: No results for input(s): CKTOTAL, CKMB, CKMBINDEX, TROPONINI in the last 168 hours. No results for input(s): PROBNP in the last 8760 hours. Coagulation Profile: Recent Labs  Lab 07/09/20 1210 07/10/20 0451 07/11/20 0509 07/12/20 0504 07/13/20 0519  INR 1.2 1.4* 1.3* 1.8* 2.1*   Thyroid Function Tests: Recent Labs    07/12/20 1129  TSH 0.636   Lipid Profile: No results for input(s): CHOL, HDL, LDLCALC, TRIG, CHOLHDL, LDLDIRECT in the last 72 hours. Anemia Panel: Recent Labs    07/10/20 1239  VITAMINB12 924*  FOLATE 6.9  FERRITIN 448*  TIBC 202*  IRON 65   Urine analysis:    Component Value Date/Time   COLORURINE YELLOW 07/07/2020 1628   APPEARANCEUR CLEAR 07/07/2020 1628   LABSPEC 1.014 07/07/2020 1628   PHURINE 5.0 07/07/2020 1628   GLUCOSEU 50 (A) 07/07/2020 1628   HGBUR NEGATIVE 07/07/2020 1628   BILIRUBINUR NEGATIVE 07/07/2020 1628   KETONESUR NEGATIVE 07/07/2020 1628   PROTEINUR 100 (A) 07/07/2020 1628   UROBILINOGEN 1.0 07/16/2015 1715   NITRITE NEGATIVE 07/07/2020 1628   LEUKOCYTESUR NEGATIVE 07/07/2020 1628   Sepsis Labs: Invalid input(s): PROCALCITONIN, [place]  Microbiology: Recent Results (from the past 240 hour(s))  Respiratory Panel by RT PCR (Flu A&B, Covid) - Nasopharyngeal Swab     Status: None   Collection Time: 07/07/20  9:15 PM   Specimen: Nasopharyngeal Swab  Result Value Ref Range Status   SARS Coronavirus 2 by RT PCR NEGATIVE NEGATIVE Final    Comment: (NOTE) SARS-CoV-2 target nucleic acids are NOT DETECTED.  The SARS-CoV-2 RNA is generally detectable in upper respiratoy specimens during the acute phase of infection. The lowest concentration of SARS-CoV-2 viral copies this assay can detect is 131 copies/mL. A negative result does not preclude SARS-Cov-2 infection and should not be used as the sole basis for treatment or other patient  management decisions. A negative result may occur with  improper specimen collection/handling, submission of specimen other than nasopharyngeal swab, presence of viral mutation(s) within the areas targeted by this assay, and inadequate number of viral copies (<131 copies/mL). A negative result must be combined with clinical observations, patient history, and epidemiological information. The expected result is Negative.  Fact Sheet for Patients:  PinkCheek.be  Fact Sheet for Healthcare Providers:  GravelBags.it  This test is no t yet approved or cleared by the [place] FDA and  has been authorized for detection  and/or diagnosis of SARS-CoV-2 by FDA under an Emergency Use Authorization (EUA). This EUA will remain  in effect (meaning this test can be used) for the duration of the COVID-19 declaration under Section 564(b)(1) of the Act, 21 U.S.C. section 360bbb-3(b)(1), unless the authorization is terminated or revoked sooner.     Influenza A by PCR NEGATIVE NEGATIVE Final   Influenza B by PCR NEGATIVE NEGATIVE Final    Comment: (NOTE) The Xpert Xpress SARS-CoV-2/FLU/RSV assay is intended as an aid in  the diagnosis of influenza from Nasopharyngeal swab specimens and  should not be used as a sole basis for treatment. Nasal washings and  aspirates are unacceptable for Xpert Xpress SARS-CoV-2/FLU/RSV  testing.  Fact Sheet for Patients: PinkCheek.be  Fact Sheet for Healthcare Providers: GravelBags.it  This test is not yet approved or cleared by the [place] FDA and  has been authorized for detection and/or diagnosis of SARS-CoV-2 by  FDA under an Emergency Use Authorization (EUA). This EUA will remain  in effect (meaning this test can be used) for the duration of the  Covid-19 declaration under Section 564(b)(1) of the Act, 21  U.S.C. section 360bbb-3(b)(1),  unless the authorization is  terminated or revoked. Performed at [facility], [place] [address], [place], [place] [address]     Radiology Studies: No results found.   [facility]  If 7PM-7AM, please contact night-coverage [email or web address] 07/13/2020, 12:22 PM

## 2020-07-13 NOTE — Progress Notes (Signed)
Occupational Therapy Treatment Patient Details Name: Dennis [name]. MRN: [id number] DOB: Jan 22, 1961 Today's Date: 07/13/2020    History of present illness 59 yo male admitted with unsteady gait, slurred speech. Hx of CHF, DM, CVA, pacemaker, MDD, metastatic lung ca, polysubstance abuse   OT comments  Treatment focused on activity tolerance and improving independence with ADLs. Patient supine in bed when therapist entered the room. Patient transferred to edge of bed with supervision and ambulated to bathroom without DME with min guard from therapist. Patient stood at sink to perform oral, washing portions of upper and lower body. Once sitting break required during oral care when patient's responses slowed - but he had no complaints of dizziness or weakness. Patient returned to standing to finish bathing task. Patient exhibited decreased coordination with LUE during task from prior CVA. Patient tolerated well. Seated in chair at end of treatment.   Follow Up Recommendations  Home health OT;Supervision/Assistance - 24 hour;SNF    Equipment Recommendations  3 in 1 bedside commode    Recommendations for Other Services      Precautions / Restrictions Precautions Precautions: Fall Precaution Comments: high fall risk! passed out with no warning signs with NT and on PT eval...Marland KitchenMarland KitchenMarland Kitchenorthostatic BP's       Mobility Bed Mobility Overal bed mobility: Needs Assistance Bed Mobility: Supine to Sit     Supine to sit: Supervision     General bed mobility comments: supervision for transfer into sit.  Transfers Overall transfer level: Needs assistance Equipment used: None Transfers: Sit to/from [facility] Sit to Stand: Min guard Stand pivot transfers: Min guard       General transfer comment: min guard for all mobility for safety secondary to orthostatic HTN. No complaints of dizziness or weakness with activity, sitting or standing.    Balance Overall balance assessment:  Mild deficits observed, not formally tested                                         ADL either performed or assessed with clinical judgement   ADL Overall ADL's : Needs assistance/impaired     Grooming: Min guard;Standing;Wash/dry hands;Wash/dry face;Oral care Grooming Details (indicate cue type and reason): performed oral care and face washing standing at sink with therapist providing min guard for safety. Upper Body Bathing: Min guard;Standing Upper Body Bathing Details (indicate cue type and reason): washed up standing at sink. Lower Body Bathing: Min guard;Sit to/from stand Lower Body Bathing Details (indicate cue type and reason): washed up standing at sink.         Toilet Transfer: Magazine features editor Details (indicate cue type and reason): min guard for transfer Toileting- Water quality scientist and Hygiene: Min guard Toileting - Clothing Manipulation Details (indicate cue type and reason): min guard for toileting - able to manage clothing and wiping             Vision       Perception     Praxis      Cognition Arousal/Alertness: Awake/alert Behavior During Therapy: WFL for tasks assessed/performed Overall Cognitive Status: Within Functional Limits for tasks assessed                                          Exercises     Shoulder Instructions  General Comments      Pertinent Vitals/ Pain       Pain Assessment: No/denies pain  Home Living                                          Prior Functioning/Environment              Frequency  Min 2X/week        Progress Toward Goals  OT Goals(current goals can now be found in the care plan section)  Progress towards OT goals: Progressing toward goals  Acute Rehab OT Goals Patient Stated Goal: get well OT Goal Formulation: With patient Time For Goal Achievement: 07/24/20 Potential to Achieve Goals: Good  Plan Discharge plan remains  appropriate    Co-evaluation                 AM-PAC OT "6 Clicks" Daily Activity     Outcome Measure   Help from another person eating meals?: A Little Help from another person taking care of personal grooming?: A Little Help from another person toileting, which includes using toliet, bedpan, or urinal?: A Little Help from another person bathing (including washing, rinsing, drying)?: A Little Help from another person to put on and taking off regular upper body clothing?: A Little Help from another person to put on and taking off regular lower body clothing?: A Little 6 Click Score: 18    End of Session Equipment Utilized During Treatment: Gait belt      Activity Tolerance Patient tolerated treatment well   Patient Left in chair;with call bell/phone within reach;with chair alarm set   Nurse Communication Mobility status        Time: 1517-6160 OT Time Calculation (min): 15 min  Charges: OT General Charges $OT Visit: 1 Visit OT Treatments $Self Care/Home Management : 8-22 mins  Derl Barrow, OTR/L [place]  Office [phone] Pager: [email or web address] 07/13/2020, 9:11 AM

## 2020-07-13 NOTE — Progress Notes (Signed)
  Speech Language Pathology Treatment: Cognitive-Linquistic  Patient Details Name: Dennis [name]. MRN: [id number] DOB: [birth year] Today's Date: 07/13/2020 Time: 0141-0301 SLP Time Calculation (min) (ACUTE ONLY): 20 min  Assessment / Plan / Recommendation Clinical Impression  Pt today seen for dysarthria/apraxia screening evaluation including areas of intelligibility, respiration, prosody and articulatory. Respiratory strength for sustained /s/ was only 8 seconds with 3 trials - which is ranked poor.  Vocal quality is clear, phonation of /a/ adequate 13 seconds, with intact ability to increase volume and glide scale.  He does demonstrate mild difficulty with prosody - differentiating stress patterns in sentences.  Mild hypernasal speech noted inconsistently.  No groping behaviors apparent with articulation.  Speech rate of movement was 9 DDK words per five seconds which is good.  Repetition of words that are lengthier with complex consonants were challenging for pt due to his ataxia.  Repetition of single words x3 did not vary.  Lingual weakness noted on left which adds a component of mixed dysarthria.  Areas of attention needed to be addressed was articulation as hypernasal speech can not be modified via behavior.  Pt agreeable to focus on articulation to improve his speech clarity/expression.     HPI HPI: 59 yo male admitted with unsteady gait, slurred speech. Hx of CHF, DM, CVA, pacemaker, MDD, metastatic lung ca, polysubstance abuse.  Pt with prior h/o right frontal event.  Swallow and speech eval conducted.  Per imaging "There appears to be infiltrating enhancement in the ventral pons and proximal medulla concerning for metastatic disease. This is a relatively extensive area of enhancement per CT brain.    Pt has been seen for speech/swallowing treatment.      SLP Plan  Continue with current plan of care       Recommendations  Compensations:  (oral pocketing on left check)                 SLP Visit Diagnosis: Dysarthria and anarthria (R47.1) Plan: Continue with current plan of care       GO               Kathleen Lime, MS [place]  [name] 07/13/2020, 1:08 PM

## 2020-07-13 NOTE — Progress Notes (Signed)
Physical Therapy Treatment Patient Details Name: Dennis [name]. MRN: [id number] DOB: 1961-04-29 Today's Date: 07/13/2020    History of Present Illness 59 yo male admitted with unsteady gait, slurred speech. Hx of CHF, DM, CVA, pacemaker, MDD, metastatic lung ca, polysubstance abuse    PT Comments    Pt OOB in recliner.  Assisted to bathroom.  General transfer comment: min guard for all mobility for safety secondary to orthostatic HTN. No complaints of dizziness or weakness with activity, sitting or standing.  Assisted with a standing at toilet void attempt. General Gait Details: increased unsteadiness with cane.  Quick gait.  25% VC's to to decrease gait speed to increase balance/stability.  Used a cane this session vs walker.  Pt admits hw won't be using either at home.  No c/o dizziness.  "I feel fine".  BP after 3 min walk 85/67 with HR 92. Assisted back to bed per pt request.  Instructed he did not need to wear ABD binder "all the time" only when "up and moving"  "wear during day".  Pt admits he slept in it last night because "no one told me".  Educated that [place] are to be applied in morning before you get up for the day and both can come off at night.    Follow Up Recommendations  Supervision for mobility/OOB;Home health PT     Equipment Recommendations  None recommended by PT    Recommendations for Other Services       Precautions / Restrictions Precautions Precautions: Fall Precaution Comments: ABD binder and Thigh Highs for Orthostatic BP's Restrictions Weight Bearing Restrictions: No    Mobility  Bed Mobility Overal bed mobility: Modified Independent             General bed mobility comments: assisted back to bed increased time but self able  Transfers Overall transfer level: Needs assistance Equipment used: None Transfers: Sit to/from [facility] Sit to Stand: Min guard Stand pivot transfers: Min guard       General transfer  comment: min guard for all mobility for safety secondary to orthostatic HTN. No complaints of dizziness or weakness with activity, sitting or standing.  Assisted with a standing at toilet void attempt.  Ambulation/Gait Ambulation/Gait assistance: Min guard;Min assist Gait Distance (Feet): 185 Feet Assistive device: Straight cane Gait Pattern/deviations: Step-through pattern;Decreased stance time - left;Drifts right/left Gait velocity: too quick   General Gait Details: increased unsteadiness with cane.  Quick gait.  25% VC's to to decrease gait speed to increase balance/stability.  Used a cane this session vs walker.  Pt admits hw won't be using either at home.  No c/o dizziness.  "I feel fine".  BP after 3 min walk 85/67 with HR 92.   Stairs             Wheelchair Mobility    Modified Rankin (Stroke Patients Only)       Balance                                            Cognition Arousal/Alertness: Awake/alert Behavior During Therapy: WFL for tasks assessed/performed Overall Cognitive Status: Within Functional Limits for tasks assessed                                 General Comments: AxO x  3 very nice      Exercises      General Comments        Pertinent Vitals/Pain Pain Assessment: No/denies pain    Home Living                      Prior Function            PT Goals (current goals can now be found in the care plan section) Progress towards PT goals: Progressing toward goals    Frequency    Min 3X/week      PT Plan Current plan remains appropriate    Co-evaluation              AM-PAC PT "6 Clicks" Mobility   Outcome Measure  Help needed turning from your back to your side while in a flat bed without using bedrails?: None Help needed moving from lying on your back to sitting on the side of a flat bed without using bedrails?: A Little Help needed moving to and from a bed to a chair (including a  wheelchair)?: A Little Help needed standing up from a chair using your arms (e.g., wheelchair or bedside chair)?: A Little Help needed to walk in hospital room?: A Little Help needed climbing 3-5 steps with a railing? : A Little 6 Click Score: 19    End of Session Equipment Utilized During Treatment: Gait belt Activity Tolerance: Patient tolerated treatment well Patient left: with call bell/phone within reach;with chair alarm set;in bed Nurse Communication: Mobility status PT Visit Diagnosis: Difficulty in walking, not elsewhere classified (R26.2)     Time: 7824-2353 PT Time Calculation (min) (ACUTE ONLY): 15 min  Charges:  $Gait Training: 8-22 mins                     Rica Koyanagi  PTA Acute  Rehabilitation Services Pager      [phone] Office      [phone]

## 2020-07-13 NOTE — Progress Notes (Signed)
Initial Nutrition Assessment  INTERVENTION:   -Ensure Enlive po BID, each supplement provides 350 kcal and 20 grams of protein  NUTRITION DIAGNOSIS:   Increased nutrient needs related to cancer and cancer related treatments (CHF, substance abuse) as evidenced by estimated needs.  GOAL:   Patient will meet greater than or equal to 90% of their needs  MONITOR:   PO intake, Supplement acceptance, Labs, Weight trends, I & O's  REASON FOR ASSESSMENT:   Consult Assessment of nutrition requirement/status  ASSESSMENT:   59 year old male with history of right MCA CVA, small cell lung cancer with brain mets s/p whole brain radiation, chronic CHF s/p ICD in 12/2019, IDDM-2 and cocaine use presenting with unsteady gait, slurred speech and LUE numbness admitted with concern for acute on chronic CVA.  9/25: admitted  Patient in room receiving patient care at time of visit.  Per chart review, pt with history of CVA and small cell lung cancer with brain mets. Pt's UDS is + for cocaine.  Pt has been consuming 100% of meals this admission. Will order Ensure supplements for additional kcals and protein.  Per weight records, pt has lost 19 lbs since 4/27 (12% wt loss x 5 months, significant for time frame). Suspect some degree of malnutrition but unable to diagnose at this time.   Medications reviewed. Labs reviewed: CBGs: 265-294  NUTRITION - FOCUSED PHYSICAL EXAM:  Unable to complete at this time, will attempt at follow-up  Diet Order:   Diet Order            Diet Carb Modified Fluid consistency: Thin; Room service appropriate? Yes with Assist  Diet effective now                 EDUCATION NEEDS:   Not appropriate for education at this time  Skin:  Skin Assessment: Reviewed RN Assessment  Last BM:  9/22  Height:   Ht Readings from Last 1 Encounters:  07/08/20 6' (1.829 m)    Weight:   Wt Readings from Last 1 Encounters:  07/08/20 62.1 kg    BMI:  Body mass index  is 18.58 kg/m.  Estimated Nutritional Needs:   Kcal:  1850-2050  Protein:  90-105g  Fluid:  2L/day   Clayton Bibles, MS, RD, LDN Inpatient Clinical Dietitian Contact information available via Amion

## 2020-07-13 NOTE — Progress Notes (Signed)
  Speech Language Pathology Treatment: Cognitive-Linquistic  Patient Details Name: Dennis [name]. MRN: [id number] DOB: May 18, 1961 Today's Date: 07/13/2020 Time: 9295-7473 SLP Time Calculation (min) (ACUTE ONLY): 17 min  Assessment / Plan / Recommendation Clinical Impression  Second session provided to focus on dysarthria treatments including functional tasks to complete over the weekend and lingual strengthening exercise to improve strength and ROM.  Provided pt with mirror and tongue depressor with return demonstration to its use.  Hold 3 seconds each side for 5 repetitions, TID.  Also provided pt with word list having him focus on slowing rate of articulation especially at consonant clusters.  Pt able to demonstrate this strategy with moderate assist initially fading to minimal verbal/visual cues toward end of session.  Provided him with word list to choose 10 words and record himself on his phone stating in a complete sentence with maximum effort to slow and over articulate.  Discussed environmental controls to improve his comprehensibility - Requested he turn off the television or background noise when friends/family/hospital staff come to visit to maximize their ability to comprehend him.  Pt also provided with paper and pen to write questions for md PRN. Pt agreeable to plan for home exercise.     HPI HPI: 59 yo male admitted with unsteady gait, slurred speech. Hx of CHF, DM, CVA, pacemaker, MDD, metastatic lung ca, polysubstance abuse.  Pt with prior h/o right frontal event.  Swallow and speech eval conducted.  Per imaging "There appears to be infiltrating enhancement in the ventral pons and proximal medulla concerning for metastatic disease. This is a relatively extensive area of enhancement per CT brain.    Pt has been seen for speech/swallowing treatment.      SLP Plan  Continue with current plan of care       Recommendations  Compensations:  (oral pocketing on left check)                 Oral Care Recommendations: Oral care BID Follow up Recommendations: None SLP Visit Diagnosis: Dysarthria and anarthria (R47.1) Plan: Continue with current plan of care       GO                Dennis [name] 07/13/2020, 1:25 PM  Dennis Lime, MS [place] Office [phone]

## 2020-07-13 NOTE — Progress Notes (Addendum)
Progress Note  Patient Name: Dennis [name]. Date of Encounter: 07/13/2020  [facility] Cardiologist: Glori Bickers, MD   Subjective   Patient orthostatic yesterday. Still with abdominal binder and compression hose. Midodrine increased. Patient able to ambulate okay, felt a little lightheaded.   Inpatient Medications    Scheduled Meds: . aspirin EC  81 mg Oral Daily  . atorvastatin  20 mg Oral Daily  . dexamethasone  4 mg Oral Q12H  . insulin aspart  0-15 Units Subcutaneous TID WC  . insulin aspart  0-5 Units Subcutaneous QHS  . insulin aspart  3 Units Subcutaneous TID WC  . insulin glargine  15 Units Subcutaneous Q2200  . midodrine  5 mg Oral TID WC  . Warfarin - Pharmacist Dosing Inpatient   Does not apply q1600   Continuous Infusions:  PRN Meds: acetaminophen **OR** acetaminophen (TYLENOL) oral liquid 160 mg/5 mL **OR** acetaminophen, ondansetron (ZOFRAN) IV, polyethylene glycol   Vital Signs    Vitals:   07/12/20 0300 07/12/20 0400 07/12/20 1300 07/13/20 0128  BP:   118/81 101/79  Pulse:   82 85  Resp: 17 15 18 16   Temp:   97.9 F (36.6 C) (!) 97.5 F (36.4 C)  TempSrc:   Oral Oral  SpO2:   100% 98%  Weight:      Height:        Intake/Output Summary (Last 24 hours) at 07/13/2020 0741 Last data filed at 07/12/2020 1733 Gross per 24 hour  Intake 720 ml  Output 300 ml  Net 420 ml   Last 3 Weights 07/08/2020 06/20/2020 05/23/2020  Weight (lbs) 137 lb 144 lb 14.4 oz 150 lb 8 oz  Weight (kg) 62.143 kg 65.726 kg 68.266 kg  Some encounter information is confidential and restricted. Go to Review Flowsheets activity to see all data.      Telemetry    NSR, HR 80, PVCs - Personally Reviewed  ECG    No new - Personally Reviewed  Physical Exam   GEN: No acute distress.   Neck: No JVD Cardiac: RRR, no murmurs, rubs, or gallops.  Respiratory: Clear to auscultation bilaterally. GI: Soft, nontender, non-distended  MS: No edema; No deformity. Neuro:   Nonfocal  Psych: Normal affect   Labs    High Sensitivity Troponin:  No results for input(s): TROPONINIHS in the last 720 hours.    Chemistry Recent Labs  Lab 07/07/20 1315 07/07/20 1315 07/08/20 0542 07/08/20 0542 07/09/20 1039 07/10/20 0451 07/11/20 0509  NA 136   < > 133*   < > 138 136 137  K 4.6   < > 3.6   < > 4.1 3.8 4.6  CL 95*   < > 99   < > 102 103 103  CO2 28   < > 24   < > 26 24 25   GLUCOSE 188*   < > 147*   < > 139* 65* 97  BUN 23*   < > 21*   < > 17 18 18   CREATININE 1.33*   < > 1.15   < > 1.14 0.96 0.95  CALCIUM 9.7   < > 8.8*   < > 8.7* 8.6* 9.1  PROT 7.4  --  6.8  --   --   --   --   ALBUMIN 3.2*  --  3.0*  --   --   --   --   AST 20  --  18  --   --   --   --  ALT 17  --  16  --   --   --   --   ALKPHOS 88  --  83  --   --   --   --   BILITOT 0.8  --  0.7  --   --   --   --   GFRNONAA 59*   < > >60   < > >60 >60 >60  GFRAA >60   < > >60   < > >60 >60 >60  ANIONGAP 13   < > 10   < > 10 9 9    < > = values in this interval not displayed.     Hematology Recent Labs  Lab 07/10/20 0451 07/11/20 0509 07/12/20 0504  WBC 7.4 6.7 10.2  RBC 2.50* 3.26* 3.19*  HGB 7.2* 9.3* 9.4*  HCT 22.5* 31.0* 29.8*  MCV 90.0 95.1 93.4  MCH 28.8 28.5 29.5  MCHC 32.0 30.0 31.5  RDW 13.3 13.6 13.5  PLT 332 352 384    BNPNo results for input(s): BNP, PROBNP in the last 168 hours.   DDimer No results for input(s): DDIMER in the last 168 hours.   Radiology    No results found.  Cardiac Studies   2D echo 07/09/2020  IMPRESSIONS   1. Since the last exam on 12/25/2019 LVEF has slightly improved from  15-20% to 25-30% with diffuse hypokinesis.  2. Left ventricular ejection fraction, by estimation, is 25 to 30%. The  left ventricle has severely decreased function. The left ventricle  demonstrates global hypokinesis. The left ventricular internal cavity size  was moderately dilated. There is mild  concentric left ventricular hypertrophy. Left ventricular  diastolic  parameters are consistent with Grade II diastolic dysfunction  (pseudonormalization). Elevated left atrial pressure.  3. Right ventricular systolic function is normal. The right ventricular  size is normal. There is normal pulmonary artery systolic pressure.  4. A small pericardial effusion is present. The pericardial effusion is  posterior to the left ventricle.  5. The mitral valve is normal in structure. Mild mitral valve  regurgitation. No evidence of mitral stenosis.  6. The aortic valve is normal in structure. Aortic valve regurgitation is  trivial. No aortic stenosis is present.  7. The inferior vena cava is normal in size with greater than 50%  respiratory variability, suggesting right atrial pressure of 3 mmHg.    Patient Profile     59 y.o. 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 dual chamber ICD, stroke in 4/ 2016, hyperlipidemia, type 2 diabetes mellitus, former polysubstance abuse (tobacco, cocaine, and alcohol), and non-compliancewho is being seen today for the evaluation of recurrent syncope.  Assessment & Plan    Recurrent Syncope - Pt reported recurrent syncope for the last 2 months. Episodes this admission during voiding. Vasovagal vs orthostatic - Tele with no ventricular arrhythmias - Echo showed improved EF, from 15-20% to 25-30% with G2DD, mild MR, and small PE - Carotid dopplers with 1-39% disease B/L - Orthostatics positive - ICD showed no significant arrhythmias, only tachycardia - Continue thigh high compression hose and abdominal binder - Patient had some AMS and CT head with contrast ordered which showed infiltrating enhancement in the ventral pons concerning for metastatic disease, no hemorrhage. Recommending MRI  however has pacemakerwhich is not MRI approved (confirmed  with [employer] rep).  - Oncology following>>started on decadron - continue midodrine. Patient still orthostatic and midodrine increased. Check orthostatics today. Md to see  Mixed Ischemic/NICM CM Chronic systolic CHF s/p ICD ([employer] Jude dual chamber ICD) - Non-compaction CM - Echo 12/2019 showed LVEF 20-25% with global hypokinesis and moderate asymmetric LV hypertrophy on inferolateral segment - Repeat echo showed slight improvement in LV with EF 25-30% - Patient appears euvolemic on exam - home meds including entresto, coreg, spiro>>held for recurrent syncope/hypotension - on chronic coumadin for non-compaction CM  CAD - Cath in 2010 with occluded LAD with collaterals - Myoview in 2019 showed prior infarct but no ischemia - EKG shows no acute changes - Denies chest pain - continue aspirin and statin  Possible prolonged QTc - EKG 9/27 QTc 459ms  HLD - LDL 77, goal<70 - continue lipitor  DM2 - this is poorly controlled with A1C 10.5 - per IM  Substance abuse - UDS positive for cocaine   For questions or updates, please contact [facility] Please consult [email or web address] for contact info under        Signed, Ruel Dimmick Ninfa Meeker, PA-C  07/13/2020, 7:41 AM

## 2020-07-14 DIAGNOSIS — C3490 Malignant neoplasm of unspecified part of unspecified bronchus or lung: Secondary | ICD-10-CM | POA: Diagnosis not present

## 2020-07-14 DIAGNOSIS — F1490 Cocaine use, unspecified, uncomplicated: Secondary | ICD-10-CM | POA: Diagnosis not present

## 2020-07-14 DIAGNOSIS — Z5181 Encounter for therapeutic drug level monitoring: Secondary | ICD-10-CM | POA: Diagnosis not present

## 2020-07-14 DIAGNOSIS — R471 Dysarthria and anarthria: Secondary | ICD-10-CM | POA: Diagnosis not present

## 2020-07-14 LAB — GLUCOSE, CAPILLARY
Glucose-Capillary: 213 mg/dL — ABNORMAL HIGH (ref 70–99)
Glucose-Capillary: 255 mg/dL — ABNORMAL HIGH (ref 70–99)
Glucose-Capillary: 172 mg/dL — ABNORMAL HIGH (ref 70–99)
Glucose-Capillary: 147 mg/dL — ABNORMAL HIGH (ref 70–99)

## 2020-07-14 LAB — HEMOGLOBIN AND HEMATOCRIT, BLOOD
HCT: 31.9 % — ABNORMAL LOW (ref 39.0–52.0)
Hemoglobin: 9.7 g/dL — ABNORMAL LOW (ref 13.0–17.0)

## 2020-07-14 LAB — PROTIME-INR
INR: 2.3 — ABNORMAL HIGH (ref 0.8–1.2)
Prothrombin Time: 24.7 seconds — ABNORMAL HIGH (ref 11.4–15.2)

## 2020-07-14 MED ORDER — INSULIN GLARGINE 100 UNIT/ML ~~LOC~~ SOLN
25.0000 [IU] | Freq: Every day | SUBCUTANEOUS | Status: DC
  Administered 2020-07-14: 25 [IU] via SUBCUTANEOUS
  Filled 2020-07-14: qty 0.25

## 2020-07-14 MED ORDER — WARFARIN SODIUM 5 MG PO TABS
5.0000 mg | ORAL_TABLET | Freq: Once | ORAL | Status: AC
  Administered 2020-07-14: 5 mg via ORAL
  Filled 2020-07-14: qty 1

## 2020-07-14 MED ORDER — INSULIN ASPART 100 UNIT/ML ~~LOC~~ SOLN
8.0000 [IU] | Freq: Three times a day (TID) | SUBCUTANEOUS | Status: DC
  Administered 2020-07-14 – 2020-07-22 (×23): 8 [IU] via SUBCUTANEOUS

## 2020-07-14 MED ORDER — MIDODRINE HCL 5 MG PO TABS
10.0000 mg | ORAL_TABLET | Freq: Three times a day (TID) | ORAL | Status: DC
  Administered 2020-07-14 – 2020-07-25 (×36): 10 mg via ORAL
  Filled 2020-07-14 (×35): qty 2

## 2020-07-14 NOTE — Progress Notes (Signed)
[facility] for Warfarin Indication: advanced (noncompaction) cardiomyopathy  No Known Allergies  Patient Measurements: Height: 6' (182.9 cm) Weight: 62.1 kg (137 lb) IBW/kg (Calculated) : 77.6  Vital Signs: Temp: 97.5 F (36.4 C) (10/02 1250) Temp Source: Oral (10/02 1250) BP: 119/84 (10/02 1250) Pulse Rate: 88 (10/02 1250)  Labs: Recent Labs    07/12/20 0504 07/13/20 0519 07/14/20 0626  HGB 9.4*  --  9.7*  HCT 29.8*  --  31.9*  PLT 384  --   --   LABPROT 20.5* 23.2* 24.7*  INR 1.8* 2.1* 2.3*    Estimated Creatinine Clearance: 74.4 mL/min (by C-G formula based on SCr of 0.95 mg/dL).  Medications:  Scheduled:  . aspirin EC  81 mg Oral Daily  . atorvastatin  20 mg Oral Daily  . dexamethasone  4 mg Oral Q12H  . feeding supplement (ENSURE ENLIVE)  237 mL Oral BID BM  . insulin aspart  0-15 Units Subcutaneous TID WC  . insulin aspart  0-5 Units Subcutaneous QHS  . insulin aspart  8 Units Subcutaneous TID WC  . insulin glargine  25 Units Subcutaneous Q2200  . midodrine  10 mg Oral TID WC  . 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 Per med rec and chart records on visit on 9/3, pt is on Warfarin 7.5 mg On Tues/Thur/Sat and 3.75 mg on all other days. Baseline INR subtherapeutic on admission; possible noncompliance.  Today, 07/14/20   INR now therapeutic at 2.3  Hgb low but improved after PRBC; Plt stable WNL  CT head negative for hemorrhage  No major drug-drug interactions with warfarin; continues on concomitant ASA from PTA  Eating 100% of meals per RN  Goal of Therapy:  INR 2-3 Monitor platelets by anticoagulation protocol: Yes   Plan:   Warfarin 5 mg PO x1 tonight  Daily INR   For discharge, recommend resuming PTA dosing with INR check in 3-5 days  Monitor for  signs and symptoms of bleeding  Ulice Dash, PharmD, BCPS [phone] 07/14/2020, 1:09 PM

## 2020-07-14 NOTE — Progress Notes (Signed)
PROGRESS NOTE  Dennis [name]. LYY:[id number] DOB: 04-23-1961   PCP: [name], MD  Patient is from: Home  DOA: 07/07/2020 LOS: 5  Brief Narrative / Interim history: 59 year old male with history of right MCA CVA, small cell lung cancer with brain mets s/p whole brain radiation, chronic CHF s/p ICD in 12/2019, IDDM-2 and cocaine use presenting with unsteady gait, slurred speech and LUE numbness admitted with concern for acute on chronic CVA.  CT head without contrast without acute finding.  Neurology consulted.  Could not do MRI due to ICD.  Repeat CT head with contrast did not reveal CVA but concern about infiltrating enhancement in ventral pons and proximal medulla concerning of metastatic disease.  EEG without epileptiform or seizure activity.  Patient had recurrent syncope with micturition raising concern for vasovagal syncope.  However, his orthostatic vitals were also positive.  Cardiology consulted.  He was started on low-dose midodrine.  Equal wheeze EF of 25 to 30% (previously 15 to 20%), diffuse hypokinesis G2-DD.  Oncology and radiation oncology following as well  Subjective: Seen and examined earlier this morning.  He is concerned about his orthostatic hypotension.  He says he feels lightheaded when they checked his orthostatic vitals which is still positive.  Denies chest pain or dyspnea.  Denies new focal neuro symptoms.  Denies GI symptoms.  Objective: Vitals:   07/13/20 1243 07/13/20 2109 07/14/20 0605 07/14/20 1250  BP: 110/89 (!) 154/96 132/85 119/84  Pulse: 84 83 81 88  Resp: 18 18 18 20   Temp: (!) 97.5 F (36.4 C) 98 F (36.7 C) 97.6 F (36.4 C) (!) 97.5 F (36.4 C)  TempSrc:  Oral Oral Oral  SpO2: 100% 96% 100% 98%  Weight:      Height:        Intake/Output Summary (Last 24 hours) at 07/14/2020 1315 Last data filed at 07/14/2020 1122 Gross per 24 hour  Intake 240 ml  Output 1250 ml  Net -1010 ml   Filed Weights   07/08/20 0337  Weight: 62.1 kg     Examination:  GENERAL: No apparent distress.  Nontoxic. HEENT: MMM.  Vision and hearing grossly intact.  NECK: Supple.  No apparent JVD.  RESP: On room air.  No IWOB.  Fair aeration bilaterally. CVS:  RRR. Heart sounds normal.  ABD/GI/GU: BS+. Abd soft, NTND.  MSK/EXT:  Moves extremities. No apparent deformity. No edema.  SKIN: no apparent skin lesion or wound NEURO: Awake, alert and oriented appropriately.  Mild dysarthria.  Motor 4/5 in LUE, 4+/5 elsewhere.  Light sensation grossly intact. PSYCH: Calm. Normal affect.  Procedures:  None  Microbiology summarized: COVID-19 PCR negative. Influenza PCR negative.  Assessment & Plan: Slurred speech/LUE paresthesia/unsteady gait in patient with prior right MCA CVA-CT head with and without contrast did not reveal CVA but possible pontine and medullary infiltrating lesion concerning for metastatic disease.  Unfortunately, his ICD is incompatible with MRI.  Echo EF of 25 to 30% (improved).  EEG without seizure activity.  UDS positive for cocaine.  INR was subtherapeutic on admit.  A1c 10.5%.  LDL 77. -Continue aspirin and warfarin-INR therapeutic. -Emphasize risk reduction -SLP/PT/OT-recommended home health and DME  Chronic systolic CHF s/p AICD in 10/2749-ZG chest pain or dyspnea but reports orthopnea.  Appears euvolemic.  Good urine output. -Cardiology managing -GDMT-Home Delene Loll, Coreg and Aldactone on hold due to syncope and orthostatic hypotension.  Recurrent syncope/orthostatic hypotension-orthostatic vitals positive.  Initially some concern about vasovagal. He reports some hesitancy and sense of incomplete  void for months.  Never seen urology.  Also concern about autonomic dysfunction in the setting of his brain mets.  TTE as above.  Cortisol low which is expected while on dexamethasone.  TSH normal. -Cardiology following. -Home cardiac meds on hold -Midodrine increased to 10 mg 3 times daily -Continue abdominal binder and TED  hose. -Check orthostatic vitals daily  History of CAD with occluded LAD with collaterals-no anginal symptoms. -Continue aspirin and statin  Small cell lung cancer with brain mets s/p whole brain irradiation: CT head with and without contrast concerning for pontine and medullary infiltrate.  -Oncology following-started on Decadron 4 mg every 12 hours -Radiation oncology consulted by oncology-plan for multidisciplinary conference on 07/16/2020  Substance abuse/recurrent cocaine abuse: UDS positive for cocaine. -Counseled on the importance of quitting cocaine use -TOC consulted.  Essential hypertension: Now with orthostatic hypotension -Cardiac meds and midodrine as above  Uncontrolled DM-2 with hyperglycemia: Fluctuating CBG.  A1c 10.5%.  History of noncompliance. Recent Labs  Lab 07/13/20 1240 07/13/20 1647 07/13/20 2107 07/14/20 0744 07/14/20 1118  GLUCAP 294* 224* 219* 213* 255*  -Continue SSI-moderate  -Increase Lantus from 20 to 25 units twice daily -Increase NovoLog from 6 to 8 units AC -Continue statin  Hyperlipidemia: LDL 77. -Continue with Lipitor 20 mg daily.  Lethargy/possible delirium: Resolved. -Avoid sedating medications -Reorientation and delirium precautions  Anemia of chronic disease: H&H stable at baseline.  Anemia panel basically normal. -Continue monitoring  Debility/physical deconditioning -PT/OT  Moderate malnutrition-as evidenced by low BMI and significant muscle mass and subcu fat loss. -Consult dietitian Body mass index is 18.58 kg/m. Nutrition Problem: Increased nutrient needs Etiology: cancer and cancer related treatments (CHF, substance abuse) Signs/Symptoms: estimated needs Interventions: Ensure Enlive (each supplement provides 350kcal and 20 grams of protein)   DVT prophylaxis:  Place TED hose Start: 07/10/20 1054 SCD's Start: 07/08/20 0127    Code Status: Full code Family Communication: Patient and/or RN.  Attempted to call  patient's mother for update but no answer.  Status is: Inpatient  Remains inpatient appropriate because:Hemodynamically unstable, Unsafe d/c plan and Inpatient level of care appropriate due to severity of illness   Dispo: The patient is from: Home              Anticipated d/c is to: Home              Anticipated d/c date is: 3 days              Patient currently is not medically stable to d/c.       Consultants:  Cardiology Oncology Neurology Radiation oncology   Sch Meds:  Scheduled Meds: . aspirin EC  81 mg Oral Daily  . atorvastatin  20 mg Oral Daily  . dexamethasone  4 mg Oral Q12H  . feeding supplement (ENSURE ENLIVE)  237 mL Oral BID BM  . insulin aspart  0-15 Units Subcutaneous TID WC  . insulin aspart  0-5 Units Subcutaneous QHS  . insulin aspart  8 Units Subcutaneous TID WC  . insulin glargine  25 Units Subcutaneous Q2200  . midodrine  10 mg Oral TID WC  . warfarin  5 mg Oral ONCE-1600  . Warfarin - Pharmacist Dosing Inpatient   Does not apply q1600   Continuous Infusions: PRN Meds:.acetaminophen **OR** acetaminophen (TYLENOL) oral liquid 160 mg/5 mL **OR** acetaminophen, ondansetron (ZOFRAN) IV, polyethylene glycol  Antimicrobials: Anti-infectives (From admission, onward)   None       I have personally reviewed the following labs  and images: CBC: Recent Labs  Lab 07/08/20 0542 07/08/20 0542 07/09/20 1039 07/10/20 0451 07/11/20 0509 07/12/20 0504 07/14/20 0626  WBC 6.5  --  6.6 7.4 6.7 10.2  --   NEUTROABS  --   --  4.4  --  5.5  --   --   HGB 8.8*   < > 7.5* 7.2* 9.3* 9.4* 9.7*  HCT 28.1*   < > 24.4* 22.5* 31.0* 29.8* 31.9*  MCV 91.2  --  94.2 90.0 95.1 93.4  --   PLT 307  --  323 332 352 384  --    < > = values in this interval not displayed.   BMP &GFR Recent Labs  Lab 07/08/20 0542 07/09/20 1039 07/10/20 0451 07/11/20 0509  NA 133* 138 136 137  K 3.6 4.1 3.8 4.6  CL 99 102 103 103  CO2 24 26 24 25   GLUCOSE 147* 139* 65* 97    BUN 21* 17 18 18   CREATININE 1.15 1.14 0.96 0.95  CALCIUM 8.8* 8.7* 8.6* 9.1   Estimated Creatinine Clearance: 74.4 mL/min (by C-G formula based on SCr of 0.95 mg/dL). Liver & Pancreas: Recent Labs  Lab 07/08/20 0542  AST 18  ALT 16  ALKPHOS 83  BILITOT 0.7  PROT 6.8  ALBUMIN 3.0*   No results for input(s): LIPASE, AMYLASE in the last 168 hours. Recent Labs  Lab 07/10/20 1057  AMMONIA 26   Diabetic: No results for input(s): HGBA1C in the last 72 hours. Recent Labs  Lab 07/13/20 1240 07/13/20 1647 07/13/20 2107 07/14/20 0744 07/14/20 1118  GLUCAP 294* 224* 219* 213* 255*   Cardiac Enzymes: No results for input(s): CKTOTAL, CKMB, CKMBINDEX, TROPONINI in the last 168 hours. No results for input(s): PROBNP in the last 8760 hours. Coagulation Profile: Recent Labs  Lab 07/10/20 0451 07/11/20 0509 07/12/20 0504 07/13/20 0519 07/14/20 0626  INR 1.4* 1.3* 1.8* 2.1* 2.3*   Thyroid Function Tests: Recent Labs    07/12/20 1129  TSH 0.636   Lipid Profile: No results for input(s): CHOL, HDL, LDLCALC, TRIG, CHOLHDL, LDLDIRECT in the last 72 hours. Anemia Panel: No results for input(s): VITAMINB12, FOLATE, FERRITIN, TIBC, IRON, RETICCTPCT in the last 72 hours. Urine analysis:    Component Value Date/Time   COLORURINE YELLOW 07/07/2020 1628   APPEARANCEUR CLEAR 07/07/2020 1628   LABSPEC 1.014 07/07/2020 1628   PHURINE 5.0 07/07/2020 1628   GLUCOSEU 50 (A) 07/07/2020 1628   HGBUR NEGATIVE 07/07/2020 1628   BILIRUBINUR NEGATIVE 07/07/2020 1628   KETONESUR NEGATIVE 07/07/2020 1628   PROTEINUR 100 (A) 07/07/2020 1628   UROBILINOGEN 1.0 07/16/2015 1715   NITRITE NEGATIVE 07/07/2020 1628   LEUKOCYTESUR NEGATIVE 07/07/2020 1628   Sepsis Labs: Invalid input(s): PROCALCITONIN, [place]  Microbiology: Recent Results (from the past 240 hour(s))  Respiratory Panel by RT PCR (Flu A&B, Covid) - Nasopharyngeal Swab     Status: None   Collection Time: 07/07/20  9:15  PM   Specimen: Nasopharyngeal Swab  Result Value Ref Range Status   SARS Coronavirus 2 by RT PCR NEGATIVE NEGATIVE Final    Comment: (NOTE) SARS-CoV-2 target nucleic acids are NOT DETECTED.  The SARS-CoV-2 RNA is generally detectable in upper respiratoy specimens during the acute phase of infection. The lowest concentration of SARS-CoV-2 viral copies this assay can detect is 131 copies/mL. A negative result does not preclude SARS-Cov-2 infection and should not be used as the sole basis for treatment or other patient management decisions. A negative result may occur  with  improper specimen collection/handling, submission of specimen other than nasopharyngeal swab, presence of viral mutation(s) within the areas targeted by this assay, and inadequate number of viral copies (<131 copies/mL). A negative result must be combined with clinical observations, patient history, and epidemiological information. The expected result is Negative.  Fact Sheet for Patients:  PinkCheek.be  Fact Sheet for Healthcare Providers:  GravelBags.it  This test is no t yet approved or cleared by the [place] FDA and  has been authorized for detection and/or diagnosis of SARS-CoV-2 by FDA under an Emergency Use Authorization (EUA). This EUA will remain  in effect (meaning this test can be used) for the duration of the COVID-19 declaration under Section 564(b)(1) of the Act, 21 U.S.C. section 360bbb-3(b)(1), unless the authorization is terminated or revoked sooner.     Influenza A by PCR NEGATIVE NEGATIVE Final   Influenza B by PCR NEGATIVE NEGATIVE Final    Comment: (NOTE) The Xpert Xpress SARS-CoV-2/FLU/RSV assay is intended as an aid in  the diagnosis of influenza from Nasopharyngeal swab specimens and  should not be used as a sole basis for treatment. Nasal washings and  aspirates are unacceptable for Xpert Xpress SARS-CoV-2/FLU/RSV    testing.  Fact Sheet for Patients: PinkCheek.be  Fact Sheet for Healthcare Providers: GravelBags.it  This test is not yet approved or cleared by the [place] FDA and  has been authorized for detection and/or diagnosis of SARS-CoV-2 by  FDA under an Emergency Use Authorization (EUA). This EUA will remain  in effect (meaning this test can be used) for the duration of the  Covid-19 declaration under Section 564(b)(1) of the Act, 21  U.S.C. section 360bbb-3(b)(1), unless the authorization is  terminated or revoked. Performed at [facility], [place] [address], [place], [place] [address]     Radiology Studies: No results found.   Dennis [name]  If 7PM-7AM, please contact night-coverage [email or web address] 07/14/2020, 1:15 PM

## 2020-07-14 NOTE — Progress Notes (Signed)
Progress Note  Patient Name: Dennis [name]. Date of Encounter: 07/14/2020  [facility] Cardiologist: Glori Bickers, MD   Subjective   No complaints today.  Has not had any further dizziness.  Worked with PT yesterday and BP dropped in 85/67mmHg after 3 min walk.    Inpatient Medications    Scheduled Meds: . aspirin EC  81 mg Oral Daily  . atorvastatin  20 mg Oral Daily  . dexamethasone  4 mg Oral Q12H  . feeding supplement (ENSURE ENLIVE)  237 mL Oral BID BM  . insulin aspart  0-15 Units Subcutaneous TID WC  . insulin aspart  0-5 Units Subcutaneous QHS  . insulin aspart  6 Units Subcutaneous TID WC  . insulin glargine  20 Units Subcutaneous Q2200  . midodrine  5 mg Oral TID WC  . Warfarin - Pharmacist Dosing Inpatient   Does not apply q1600   Continuous Infusions:  PRN Meds: acetaminophen **OR** acetaminophen (TYLENOL) oral liquid 160 mg/5 mL **OR** acetaminophen, ondansetron (ZOFRAN) IV, polyethylene glycol   Vital Signs    Vitals:   07/13/20 0128 07/13/20 1243 07/13/20 2109 07/14/20 0605  BP: 101/79 110/89 (!) 154/96 132/85  Pulse: 85 84 83 81  Resp: 16 18 18 18   Temp: (!) 97.5 F (36.4 C) (!) 97.5 F (36.4 C) 98 F (36.7 C) 97.6 F (36.4 C)  TempSrc: Oral  Oral Oral  SpO2: 98% 100% 96% 100%  Weight:      Height:        Intake/Output Summary (Last 24 hours) at 07/14/2020 0749 Last data filed at 07/14/2020 4174 Gross per 24 hour  Intake 600 ml  Output 1250 ml  Net -650 ml   Last 3 Weights 07/08/2020 06/20/2020 05/23/2020  Weight (lbs) 137 lb 144 lb 14.4 oz 150 lb 8 oz  Weight (kg) 62.143 kg 65.726 kg 68.266 kg  Some encounter information is confidential and restricted. Go to Review Flowsheets activity to see all data.      Telemetry    NSR with occasional PVCs - Personally Reviewed  ECG    No new EKG to review - Personally Reviewed  Physical Exam   GEN: Well nourished, well developed in no acute distress HEENT: Normal NECK: No JVD; No  carotid bruits LYMPHATICS: No lymphadenopathy CARDIAC:RRR, no murmurs, rubs, gallops RESPIRATORY:  Clear to auscultation without rales, wheezing or rhonchi  ABDOMEN: Soft, non-tender, non-distended MUSCULOSKELETAL:  No edema; No deformity  SKIN: Warm and dry NEUROLOGIC:  Alert and oriented x 3 PSYCHIATRIC:  Normal affect    Labs    High Sensitivity Troponin:  No results for input(s): TROPONINIHS in the last 720 hours.    Chemistry Recent Labs  Lab 07/07/20 1315 07/07/20 1315 07/08/20 0542 07/08/20 0542 07/09/20 1039 07/10/20 0451 07/11/20 0509  NA 136   < > 133*   < > 138 136 137  K 4.6   < > 3.6   < > 4.1 3.8 4.6  CL 95*   < > 99   < > 102 103 103  CO2 28   < > 24   < > 26 24 25   GLUCOSE 188*   < > 147*   < > 139* 65* 97  BUN 23*   < > 21*   < > 17 18 18   CREATININE 1.33*   < > 1.15   < > 1.14 0.96 0.95  CALCIUM 9.7   < > 8.8*   < > 8.7* 8.6* 9.1  PROT 7.4  --  6.8  --   --   --   --   ALBUMIN 3.2*  --  3.0*  --   --   --   --   AST 20  --  18  --   --   --   --   ALT 17  --  16  --   --   --   --   ALKPHOS 88  --  83  --   --   --   --   BILITOT 0.8  --  0.7  --   --   --   --   GFRNONAA 59*   < > >60   < > >60 >60 >60  GFRAA >60   < > >60   < > >60 >60 >60  ANIONGAP 13   < > 10   < > 10 9 9    < > = values in this interval not displayed.     Hematology Recent Labs  Lab 07/10/20 0451 07/11/20 0509 07/12/20 0504  WBC 7.4 6.7 10.2  RBC 2.50* 3.26* 3.19*  HGB 7.2* 9.3* 9.4*  HCT 22.5* 31.0* 29.8*  MCV 90.0 95.1 93.4  MCH 28.8 28.5 29.5  MCHC 32.0 30.0 31.5  RDW 13.3 13.6 13.5  PLT 332 352 384    BNPNo results for input(s): BNP, PROBNP in the last 168 hours.   DDimer No results for input(s): DDIMER in the last 168 hours.   Radiology    No results found.  Cardiac Studies   2D echo 07/09/2020  IMPRESSIONS   1. Since the last exam on 12/25/2019 LVEF has slightly improved from  15-20% to 25-30% with diffuse hypokinesis.  2. Left ventricular  ejection fraction, by estimation, is 25 to 30%. The  left ventricle has severely decreased function. The left ventricle  demonstrates global hypokinesis. The left ventricular internal cavity size  was moderately dilated. There is mild  concentric left ventricular hypertrophy. Left ventricular diastolic  parameters are consistent with Grade II diastolic dysfunction  (pseudonormalization). Elevated left atrial pressure.  3. Right ventricular systolic function is normal. The right ventricular  size is normal. There is normal pulmonary artery systolic pressure.  4. A small pericardial effusion is present. The pericardial effusion is  posterior to the left ventricle.  5. The mitral valve is normal in structure. Mild mitral valve  regurgitation. No evidence of mitral stenosis.  6. The aortic valve is normal in structure. Aortic valve regurgitation is  trivial. No aortic stenosis is present.  7. The inferior vena cava is normal in size with greater than 50%  respiratory variability, suggesting right atrial pressure of 3 mmHg.    Patient Profile     59 y.o. 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 dual chamber ICD, stroke in 4/ 2016, hyperlipidemia, type 2 diabetes mellitus, former polysubstance abuse (tobacco, cocaine, and alcohol), and non-compliancewho is being seen today for the evaluation of recurrent syncope.  Assessment & Plan    Recurrent Syncope - Pt reported recurrent syncope for the last 2 months. Episodes this admission during voiding. Vasovagal vs orthostatic - Tele with no ventricular arrhythmias - Echo showed improved EF, from 15-20% to 25-30% with G2DD, mild MR, and small PE - Carotid dopplers with 1-39% disease  B/L - Orthostatics positive - ICD  showed no significant arrhythmias, only tachycardia - Continue thigh high compression hose and abdominal binder - Patient had some AMS and CT head with contrast ordered which showed infiltrating enhancement in the ventral pons concerning for metastatic disease, no hemorrhage. Recommending MRI however has pacemakerwhich is not MRI approved (confirmed with [employer] rep).  - Oncology following>>started on decadron - he was orthostatic yesterday when working with PT and on orthostatics (continue midodrine. Patient still orthostatic and midodrine increased. Check orthostatics supine 110/89>>sitting 83/52mmHg>>standing 72/55mmHg>>standing 3 min 80/61mmHg) -will increase Proamatine to 10mg  TID -may need to consider addition of Mestinon -am Cortisol yesterday was low at 3.4 so may have a component of adrenal insuff >>PER TRH  Mixed Ischemic/NICM CM Chronic systolic CHF s/p ICD ([employer] dual chamber ICD) - Non-compaction CM - Echo 12/2019 showed LVEF 20-25% with global hypokinesis and moderate asymmetric LV hypertrophy on inferolateral segment - Repeat echo showed slight improvement in LV with EF 25-30% - Patient does not appear volume overloaded on exam despite holding BP meds - home meds including entresto, coreg, spiro>>held for recurrent syncope/hypotension - on chronic coumadin for non-compaction CM  CAD - Cath in 2010 with occluded LAD with collaterals - Myoview in 2019 showed prior infarct but no ischemia - EKG shows no acute changes - Denies chest pain - continue aspirin and statin  Possible prolonged QTc - EKG 9/27 QTc 467ms  HLD - LDL 77, goal<70 - continue lipitor  DM2 - this is poorly controlled with A1C 10.5 - per IM  Substance abuse - UDS positive for cocaine   For questions or updates, please contact [facility] Please consult [email or web address] for contact info under        Signed, Fransico Him, MD  07/14/2020, 7:49 AM

## 2020-07-15 DIAGNOSIS — Z5181 Encounter for therapeutic drug level monitoring: Secondary | ICD-10-CM | POA: Diagnosis not present

## 2020-07-15 DIAGNOSIS — R471 Dysarthria and anarthria: Secondary | ICD-10-CM | POA: Diagnosis not present

## 2020-07-15 DIAGNOSIS — C3490 Malignant neoplasm of unspecified part of unspecified bronchus or lung: Secondary | ICD-10-CM | POA: Diagnosis not present

## 2020-07-15 DIAGNOSIS — F1490 Cocaine use, unspecified, uncomplicated: Secondary | ICD-10-CM | POA: Diagnosis not present

## 2020-07-15 LAB — PROTIME-INR
INR: 2.6 — ABNORMAL HIGH (ref 0.8–1.2)
Prothrombin Time: 26.8 seconds — ABNORMAL HIGH (ref 11.4–15.2)

## 2020-07-15 LAB — GLUCOSE, CAPILLARY
Glucose-Capillary: 261 mg/dL — ABNORMAL HIGH (ref 70–99)
Glucose-Capillary: 253 mg/dL — ABNORMAL HIGH (ref 70–99)
Glucose-Capillary: 270 mg/dL — ABNORMAL HIGH (ref 70–99)
Glucose-Capillary: 178 mg/dL — ABNORMAL HIGH (ref 70–99)

## 2020-07-15 MED ORDER — WARFARIN SODIUM 4 MG PO TABS
4.0000 mg | ORAL_TABLET | Freq: Once | ORAL | Status: AC
  Administered 2020-07-15: 4 mg via ORAL
  Filled 2020-07-15 (×2): qty 1

## 2020-07-15 MED ORDER — INSULIN GLARGINE 100 UNIT/ML ~~LOC~~ SOLN
18.0000 [IU] | Freq: Two times a day (BID) | SUBCUTANEOUS | Status: DC
  Administered 2020-07-15 (×2): 18 [IU] via SUBCUTANEOUS
  Filled 2020-07-15 (×3): qty 0.18

## 2020-07-15 MED ORDER — POLYETHYLENE GLYCOL 3350 17 G PO PACK
17.0000 g | PACK | Freq: Two times a day (BID) | ORAL | Status: DC | PRN
  Filled 2020-07-15: qty 1

## 2020-07-15 MED ORDER — INSULIN ASPART 100 UNIT/ML ~~LOC~~ SOLN
0.0000 [IU] | Freq: Three times a day (TID) | SUBCUTANEOUS | Status: DC
  Administered 2020-07-15 – 2020-07-16 (×2): 11 [IU] via SUBCUTANEOUS
  Administered 2020-07-16 (×2): 4 [IU] via SUBCUTANEOUS
  Administered 2020-07-17 (×3): 7 [IU] via SUBCUTANEOUS
  Administered 2020-07-18: 11 [IU] via SUBCUTANEOUS
  Administered 2020-07-18: 7 [IU] via SUBCUTANEOUS
  Administered 2020-07-18: 3 [IU] via SUBCUTANEOUS
  Administered 2020-07-19 (×3): 4 [IU] via SUBCUTANEOUS
  Administered 2020-07-20: 20 [IU] via SUBCUTANEOUS
  Administered 2020-07-20: 4 [IU] via SUBCUTANEOUS
  Administered 2020-07-21: 7 [IU] via SUBCUTANEOUS
  Administered 2020-07-21 – 2020-07-22 (×2): 4 [IU] via SUBCUTANEOUS
  Administered 2020-07-22: 8 [IU] via SUBCUTANEOUS
  Administered 2020-07-22: 7 [IU] via SUBCUTANEOUS
  Administered 2020-07-23 – 2020-07-24 (×4): 4 [IU] via SUBCUTANEOUS
  Administered 2020-07-24 (×2): 7 [IU] via SUBCUTANEOUS
  Administered 2020-07-25: 11 [IU] via SUBCUTANEOUS
  Administered 2020-07-25: 7 [IU] via SUBCUTANEOUS

## 2020-07-15 MED ORDER — INSULIN ASPART 100 UNIT/ML ~~LOC~~ SOLN: 0.0000 [IU] | Freq: Every day | SUBCUTANEOUS | Status: DC

## 2020-07-15 MED ORDER — SENNOSIDES-DOCUSATE SODIUM 8.6-50 MG PO TABS
1.0000 | ORAL_TABLET | Freq: Two times a day (BID) | ORAL | Status: DC | PRN
  Administered 2020-07-21: 1 via ORAL
  Filled 2020-07-15: qty 1

## 2020-07-15 NOTE — Progress Notes (Signed)
PROGRESS NOTE  Dennis [name]. 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  CT head without contrast without acute finding.  Neurology consulted.  Could not do MRI due to ICD.  Repeat CT head with contrast did not reveal CVA but concern about infiltrating enhancement in ventral pons and proximal medulla concerning of metastatic disease.  EEG without epileptiform or seizure activity.  Patient had recurrent syncope with micturition raising concern for vasovagal syncope.  However, his orthostatic vitals were also positive.  Cardiology consulted.  He was started on low-dose midodrine.  Equal wheeze EF of 25 to 30% (previously 15 to 20%), diffuse hypokinesis G2-DD.  Oncology and radiation oncology following as well  Subjective: Seen and examined earlier this morning.  No major events overnight of this morning.  Continues to report dizziness when he gets up.  Orthostatic vitals remains positive.   Objective: Vitals:   07/14/20 1941 07/14/20 2102 07/15/20 0649 07/15/20 1227  BP: (!) 162/101 (!) 156/97 113/83 112/86  Pulse: 86 82 79 84  Resp: 18  18 18   Temp: 98.4 F (36.9 C)  98 F (36.7 C) 98.4 F (36.9 C)  TempSrc: Oral   Oral  SpO2: 96%  100% 99%  Weight:      Height:        Intake/Output Summary (Last 24 hours) at 07/15/2020 1427 Last data filed at 07/15/2020 0649 Gross per 24 hour  Intake 360 ml  Output 875 ml  Net -515 ml   Filed Weights   07/08/20 0337  Weight: 62.1 kg    Examination:  GENERAL: No apparent distress.  Nontoxic. HEENT: MMM.  Vision and hearing grossly intact.  NECK: Supple.  No apparent  JVD.  RESP: On room air.  No IWOB.  Fair aeration bilaterally. CVS:  RRR. Heart sounds normal.  ABD/GI/GU: BS+. Abd soft, NTND.  Abdominal binder in place. MSK/EXT:  Moves extremities. No apparent deformity. No edema.  TED hose in place. SKIN: no apparent skin lesion or wound NEURO: Awake, alert and oriented appropriately.  Cranial nerves intact except for some dysarthria.  Motor 5/5 in RUE, 4+/5 elsewhere.  Notable mild pronator drift in LUE but improved.  Light sensation grossly intact. PSYCH: Calm. Normal affect.   Procedures:  None  Microbiology summarized: COVID-19 PCR negative. Influenza PCR negative.  Assessment & Plan: Slurred speech/LUE paresthesia/unsteady gait in patient with prior right MCA CVA-CT head with and without contrast did not reveal CVA but possible pontine and medullary infiltrating lesion concerning for metastatic disease.  Unfortunately, his ICD is incompatible with MRI.  Echo EF of 25 to 30% (improved).  EEG without seizure activity.  UDS positive for cocaine.  INR was subtherapeutic on admit.  A1c 10.5%.  LDL 77.  Neuro symptoms improved. -Continue aspirin and warfarin-INR therapeutic. -Emphasize risk reduction -SLP/PT/OT-recommended home health and DME  Chronic systolic CHF s/p AICD in 04/3418-FX chest pain or dyspnea but reports orthopnea.  Appears euvolemic.  Good urine output.  Net 0. -Cardiology managing -GDMT-Home Delene Loll, Coreg and Aldactone on hold due to syncope and orthostatic hypotension.  Recurrent syncope/orthostatic hypotension-orthostatic vitals positive.  Initially some concern about  vasovagal. He reports some hesitancy and sense of incomplete void for months.  Never seen urology.  Also concern about autonomic dysfunction in the setting of his brain mets.  TTE as above.  Cortisol low which is expected while on dexamethasone.  TSH normal. -Cardiology following. -Home cardiac meds on hold -Midodrine increased to 10 mg 3 times daily -Continue  abdominal binder and TED hose. -Check orthostatic vitals daily  History of CAD with occluded LAD with collaterals-no anginal symptoms. -Continue aspirin and statin  Small cell lung cancer with brain mets s/p whole brain irradiation: CT head with and without contrast concerning for pontine and medullary infiltrate.  -Oncology following-started on Decadron 4 mg every 12 hours -Radiation oncology consulted by oncology-plan for multidisciplinary conference on 07/16/2020  Substance abuse/recurrent cocaine abuse: UDS positive for cocaine. -Counseled on the importance of quitting cocaine use -TOC consulted.  Essential hypertension: Now with orthostatic hypotension -Cardiac meds and midodrine as above  Uncontrolled DM-2 with hyperglycemia: Fluctuating CBG.  A1c 10.5%.  History of noncompliance. Recent Labs  Lab 07/14/20 1118 07/14/20 1610 07/14/20 1937 07/15/20 0742 07/15/20 1113  GLUCAP 255* 172* 147* 261* 253*  -Increase SSI to high -Change Lantus from 25 units nightly 18 units twice daily -Increase NovoLog from 6 to 8 units AC -Continue statin  Hyperlipidemia: LDL 77. -Continue with Lipitor 20 mg daily.  Lethargy/possible delirium: Resolved. -Avoid sedating medications -Reorientation and delirium precautions  Anemia of chronic disease: H&H stable at baseline.  Anemia panel basically normal. -Continue monitoring  Debility/physical deconditioning -PT/OT  Moderate malnutrition-as evidenced by low BMI and significant muscle mass and subcu fat loss. -Consult dietitian Body mass index is 18.58 kg/m. Nutrition Problem: Increased nutrient needs Etiology: cancer and cancer related treatments (CHF, substance abuse) Signs/Symptoms: estimated needs Interventions: Ensure Enlive (each supplement provides 350kcal and 20 grams of protein)   DVT prophylaxis:  Place TED hose Start: 07/10/20 1054 SCD's Start: 07/08/20 0127    Code Status: Full code Family Communication:  Patient and/or RN.  Attempted to call patient's mother for update but no answer.  Status is: Inpatient  Remains inpatient appropriate because:Hemodynamically unstable, Unsafe d/c plan and Inpatient level of care appropriate due to severity of illness   Dispo: The patient is from: Home              Anticipated d/c is to: Home              Anticipated d/c date is: 3 days              Patient currently is not medically stable to d/c.       Consultants:  Cardiology Oncology Neurology Radiation oncology   Sch Meds:  Scheduled Meds: . aspirin EC  81 mg Oral Daily  . atorvastatin  20 mg Oral Daily  . dexamethasone  4 mg Oral Q12H  . feeding supplement (ENSURE ENLIVE)  237 mL Oral BID BM  . insulin aspart  0-15 Units Subcutaneous TID WC  . insulin aspart  0-5 Units Subcutaneous QHS  . insulin aspart  8 Units Subcutaneous TID WC  . insulin glargine  18 Units Subcutaneous BID  . midodrine  10 mg Oral TID WC  . warfarin  4 mg Oral ONCE-1600  . Warfarin - Pharmacist Dosing Inpatient   Does not apply q1600   Continuous Infusions: PRN Meds:.acetaminophen **OR** acetaminophen (TYLENOL) oral liquid 160 mg/5 mL **OR** acetaminophen, ondansetron (ZOFRAN) IV, polyethylene glycol  Antimicrobials: Anti-infectives (From admission, onward)   None  I have personally reviewed the following labs and images: CBC: Recent Labs  Lab 07/09/20 1039 07/10/20 0451 07/11/20 0509 07/12/20 0504 07/14/20 0626  WBC 6.6 7.4 6.7 10.2  --   NEUTROABS 4.4  --  5.5  --   --   HGB 7.5* 7.2* 9.3* 9.4* 9.7*  HCT 24.4* 22.5* 31.0* 29.8* 31.9*  MCV 94.2 90.0 95.1 93.4  --   PLT 323 332 352 384  --    BMP &GFR Recent Labs  Lab 07/09/20 1039 07/10/20 0451 07/11/20 0509  NA 138 136 137  K 4.1 3.8 4.6  CL 102 103 103  CO2 26 24 25   GLUCOSE 139* 65* 97  BUN 17 18 18   CREATININE 1.14 0.96 0.95  CALCIUM 8.7* 8.6* 9.1   Estimated Creatinine Clearance: 74.4 mL/min (by C-G formula based on  SCr of 0.95 mg/dL). Liver & Pancreas: No results for input(s): AST, ALT, ALKPHOS, BILITOT, PROT, ALBUMIN in the last 168 hours. No results for input(s): LIPASE, AMYLASE in the last 168 hours. Recent Labs  Lab 07/10/20 1057  AMMONIA 26   Diabetic: No results for input(s): HGBA1C in the last 72 hours. Recent Labs  Lab 07/14/20 1118 07/14/20 1610 07/14/20 1937 07/15/20 0742 07/15/20 1113  GLUCAP 255* 172* 147* 261* 253*   Cardiac Enzymes: No results for input(s): CKTOTAL, CKMB, CKMBINDEX, TROPONINI in the last 168 hours. No results for input(s): PROBNP in the last 8760 hours. Coagulation Profile: Recent Labs  Lab 07/11/20 0509 07/12/20 0504 07/13/20 0519 07/14/20 0626 07/15/20 0622  INR 1.3* 1.8* 2.1* 2.3* 2.6*   Thyroid Function Tests: No results for input(s): TSH, T4TOTAL, FREET4, T3FREE, THYROIDAB in the last 72 hours. Lipid Profile: No results for input(s): CHOL, HDL, LDLCALC, TRIG, CHOLHDL, LDLDIRECT in the last 72 hours. Anemia Panel: No results for input(s): VITAMINB12, FOLATE, FERRITIN, TIBC, IRON, RETICCTPCT in the last 72 hours. Urine analysis:    Component Value Date/Time   COLORURINE YELLOW 07/07/2020 1628   APPEARANCEUR CLEAR 07/07/2020 1628   LABSPEC 1.014 07/07/2020 1628   PHURINE 5.0 07/07/2020 1628   GLUCOSEU 50 (A) 07/07/2020 1628   HGBUR NEGATIVE 07/07/2020 1628   BILIRUBINUR NEGATIVE 07/07/2020 1628   KETONESUR NEGATIVE 07/07/2020 1628   PROTEINUR 100 (A) 07/07/2020 1628   UROBILINOGEN 1.0 07/16/2015 1715   NITRITE NEGATIVE 07/07/2020 1628   LEUKOCYTESUR NEGATIVE 07/07/2020 1628   Sepsis Labs: Invalid input(s): PROCALCITONIN, [place]  Microbiology: Recent Results (from the past 240 hour(s))  Respiratory Panel by RT PCR (Flu A&B, Covid) - Nasopharyngeal Swab     Status: None   Collection Time: 07/07/20  9:15 PM   Specimen: Nasopharyngeal Swab  Result Value Ref Range Status   SARS Coronavirus 2 by RT PCR NEGATIVE NEGATIVE Final     Comment: (NOTE) SARS-CoV-2 target nucleic acids are NOT DETECTED.  The SARS-CoV-2 RNA is generally detectable in upper respiratoy specimens during the acute phase of infection. The lowest concentration of SARS-CoV-2 viral copies this assay can detect is 131 copies/mL. A negative result does not preclude SARS-Cov-2 infection and should not be used as the sole basis for treatment or other patient management decisions. A negative result may occur with  improper specimen collection/handling, submission of specimen other than nasopharyngeal swab, presence of viral mutation(s) within the areas targeted by this assay, and inadequate number of viral copies (<131 copies/mL). A negative result must be combined with clinical observations, patient history, and epidemiological information. The expected result is Negative.  Fact Sheet for Patients:  PinkCheek.be  Fact Sheet for Healthcare Providers:  GravelBags.it  This test is no t yet approved or cleared by the [place] FDA and  has been authorized for detection and/or diagnosis of SARS-CoV-2 by FDA under an Emergency Use Authorization (EUA). This EUA will remain  in effect (meaning this test can be used) for the duration of the COVID-19 declaration under Section 564(b)(1) of the Act, 21 U.S.C. section 360bbb-3(b)(1), unless the authorization is terminated or revoked sooner.     Influenza A by PCR NEGATIVE NEGATIVE Final   Influenza B by PCR NEGATIVE NEGATIVE Final    Comment: (NOTE) The Xpert Xpress SARS-CoV-2/FLU/RSV assay is intended as an aid in  the diagnosis of influenza from Nasopharyngeal swab specimens and  should not be used as a sole basis for treatment. Nasal washings and  aspirates are unacceptable for Xpert Xpress SARS-CoV-2/FLU/RSV  testing.  Fact Sheet for Patients: PinkCheek.be  Fact Sheet for Healthcare  Providers: GravelBags.it  This test is not yet approved or cleared by the [place] FDA and  has been authorized for detection and/or diagnosis of SARS-CoV-2 by  FDA under an Emergency Use Authorization (EUA). This EUA will remain  in effect (meaning this test can be used) for the duration of the  Covid-19 declaration under Section 564(b)(1) of the Act, 21  U.S.C. section 360bbb-3(b)(1), unless the authorization is  terminated or revoked. Performed at [facility], [place] [name], [place], [place] [address]     Radiology Studies: No results found.   Rashawn Rayman T. Bollinger  If 7PM-7AM, please contact night-coverage [email or web address] 07/15/2020, 2:27 PM

## 2020-07-15 NOTE — Progress Notes (Signed)
Progress Note  Patient Name: Dennis [name]. Date of Encounter: 07/15/2020  [facility] Cardiologist: Glori Bickers, MD   Subjective   Denies any dizziness, chest pain , palpitations or SOB.   Orthostatics incomplete yesterday  Inpatient Medications    Scheduled Meds: . aspirin EC  81 mg Oral Daily  . atorvastatin  20 mg Oral Daily  . dexamethasone  4 mg Oral Q12H  . feeding supplement (ENSURE ENLIVE)  237 mL Oral BID BM  . insulin aspart  0-15 Units Subcutaneous TID WC  . insulin aspart  0-5 Units Subcutaneous QHS  . insulin aspart  8 Units Subcutaneous TID WC  . insulin glargine  18 Units Subcutaneous BID  . midodrine  10 mg Oral TID WC  . Warfarin - Pharmacist Dosing Inpatient   Does not apply q1600   Continuous Infusions:  PRN Meds: acetaminophen **OR** acetaminophen (TYLENOL) oral liquid 160 mg/5 mL **OR** acetaminophen, ondansetron (ZOFRAN) IV, polyethylene glycol   Vital Signs    Vitals:   07/14/20 1250 07/14/20 1941 07/14/20 2102 07/15/20 0649  BP: 119/84 (!) 162/101 (!) 156/97 113/83  Pulse: 88 86 82 79  Resp: 20 18  18   Temp: (!) 97.5 F (36.4 C) 98.4 F (36.9 C)  98 F (36.7 C)  TempSrc: Oral Oral    SpO2: 98% 96%  100%  Weight:      Height:        Intake/Output Summary (Last 24 hours) at 07/15/2020 0825 Last data filed at 07/15/2020 0649 Gross per 24 hour  Intake 1080 ml  Output 1175 ml  Net -95 ml   Last 3 Weights 07/08/2020 06/20/2020 05/23/2020  Weight (lbs) 137 lb 144 lb 14.4 oz 150 lb 8 oz  Weight (kg) 62.143 kg 65.726 kg 68.266 kg  Some encounter information is confidential and restricted. Go to Review Flowsheets activity to see all data.      Telemetry  NSR with PVCs  - Personally Reviewed  ECG    No new EKG to review - Personally Reviewed  Physical Exam   GEN: Well nourished, well developed in no acute distress HEENT: Normal NECK: No JVD; No carotid bruits LYMPHATICS: No lymphadenopathy CARDIAC:RRR, no murmurs, rubs,  gallops RESPIRATORY:  Clear to auscultation without rales, wheezing or rhonchi  ABDOMEN: Soft, non-tender, non-distended MUSCULOSKELETAL:  No edema; No deformity  SKIN: Warm and dry NEUROLOGIC:  Alert and oriented x 3 PSYCHIATRIC:  Normal affect    Labs    High Sensitivity Troponin:  No results for input(s): TROPONINIHS in the last 720 hours.    Chemistry Recent Labs  Lab 07/09/20 1039 07/10/20 0451 07/11/20 0509  NA 138 136 137  K 4.1 3.8 4.6  CL 102 103 103  CO2 26 24 25   GLUCOSE 139* 65* 97  BUN 17 18 18   CREATININE 1.14 0.96 0.95  CALCIUM 8.7* 8.6* 9.1  GFRNONAA >60 >60 >60  GFRAA >60 >60 >60  ANIONGAP 10 9 9      Hematology Recent Labs  Lab 07/10/20 0451 07/10/20 0451 07/11/20 0509 07/12/20 0504 07/14/20 0626  WBC 7.4  --  6.7 10.2  --   RBC 2.50*  --  3.26* 3.19*  --   HGB 7.2*   < > 9.3* 9.4* 9.7*  HCT 22.5*   < > 31.0* 29.8* 31.9*  MCV 90.0  --  95.1 93.4  --   MCH 28.8  --  28.5 29.5  --   MCHC 32.0  --  30.0 31.5  --  RDW 13.3  --  13.6 13.5  --   PLT 332  --  352 384  --    < > = values in this interval not displayed.    BNPNo results for input(s): BNP, PROBNP in the last 168 hours.   DDimer No results for input(s): DDIMER in the last 168 hours.   Radiology    No results found.  Cardiac Studies   2D echo 07/09/2020  IMPRESSIONS   1. Since the last exam on 12/25/2019 LVEF has slightly improved from  15-20% to 25-30% with diffuse hypokinesis.  2. Left ventricular ejection fraction, by estimation, is 25 to 30%. The  left ventricle has severely decreased function. The left ventricle  demonstrates global hypokinesis. The left ventricular internal cavity size  was moderately dilated. There is mild  concentric left ventricular hypertrophy. Left ventricular diastolic  parameters are consistent with Grade II diastolic dysfunction  (pseudonormalization). Elevated left atrial pressure.  3. Right ventricular systolic function is normal. The  right ventricular  size is normal. There is normal pulmonary artery systolic pressure.  4. A small pericardial effusion is present. The pericardial effusion is  posterior to the left ventricle.  5. The mitral valve is normal in structure. Mild mitral valve  regurgitation. No evidence of mitral stenosis.  6. The aortic valve is normal in structure. Aortic valve regurgitation is  trivial. No aortic stenosis is present.  7. The inferior vena cava is normal in size with greater than 50%  respiratory variability, suggesting right atrial pressure of 3 mmHg.    Patient Profile     59 y.o. 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 dual chamber ICD, stroke in 4/ 2016, hyperlipidemia, type 2 diabetes mellitus, former polysubstance abuse (tobacco, cocaine, and alcohol), and non-compliancewho is being seen today for the evaluation of recurrent syncope.  Assessment & Plan    Recurrent Syncope - Pt reported recurrent syncope for the last 2 months. Episodes this admission during voiding. Vasovagal vs orthostatic - Tele with no ventricular arrhythmias - Echo showed improved EF, from 15-20% to 25-30% with G2DD, mild MR, and small PE - Carotid dopplers with 1-39% disease B/L - Orthostatics markedly positive - ICD showed no significant arrhythmias, only tachycardia - Patient had some AMS and CT head with contrast ordered which showed infiltrating enhancement in the ventral pons concerning for metastatic disease, no hemorrhage. Recommending MRI however has pacemakerwhich is not MRI approved (confirmed with [employer] rep).  - Oncology following>>started on decadron - he has profound orthostatic hypotension as well as micturition syncope (has had problems voiding for months and has to  strain) - now on Proamatine 10mg  TID, abdominal binder and thigh high compression hose -orthostatic BPs yesterday incomplete -I have asked the nurse to check orthostatics today with abdominal binder and TED hose on -may need to consider addition of Mestinon if he remains orthostatic -would benefit from urology consult for voiding issues -am Cortisol low at 3.4 but on dexamethasone  Mixed Ischemic/NICM CM Chronic systolic CHF s/p ICD ([employer] Jude dual chamber ICD) - Non-compaction CM - Echo 12/2019 showed LVEF 20-25% with global hypokinesis and moderate asymmetric LV hypertrophy on inferolateral segment - Repeat echo showed slight improvement in LV with EF 25-30% - Patient does not appear volume overloaded on exam despite holding HF meds -  home meds including entresto, coreg, spiro>>held for recurrent syncope/hypotension - on chronic coumadin for non-compaction CM  CAD - Cath in 2010 with occluded LAD with collaterals - Myoview in 2019 showed prior infarct but no ischemia - EKG shows no acute changes - Denies chest pain - continue aspirin and statin  Possible prolonged QTc - EKG 9/27 QTc 446ms  HLD - LDL 77, goal<70 - continue lipitor  DM2 - this is poorly controlled with A1C 10.5 - per IM  Substance abuse - UDS positive for cocaine   For questions or updates, please contact [facility] Please consult [email or web address] for contact info under        Signed, Fransico Him, MD  07/15/2020, 8:25 AM

## 2020-07-15 NOTE — Progress Notes (Signed)
[facility] [place] for Warfarin Indication: advanced (noncompaction) cardiomyopathy  No Known Allergies  Patient Measurements: Height: 6' (182.9 cm) Weight: 62.1 kg (137 lb) IBW/kg (Calculated) : 77.6  Vital Signs: Temp: 98 F (36.7 C) (10/03 0649) BP: 113/83 (10/03 0649) Pulse Rate: 79 (10/03 0649)  Labs: Recent Labs    07/13/20 0519 07/14/20 0626 07/15/20 0622  HGB  --  9.7*  --   HCT  --  31.9*  --   LABPROT 23.2* 24.7* 26.8*  INR 2.1* 2.3* 2.6*    Estimated Creatinine Clearance: 74.4 mL/min (by C-G formula based on SCr of 0.95 mg/dL).  Medications:  Scheduled:  . aspirin EC  81 mg Oral Daily  . atorvastatin  20 mg Oral Daily  . dexamethasone  4 mg Oral Q12H  . feeding supplement (ENSURE ENLIVE)  237 mL Oral BID BM  . insulin aspart  0-15 Units Subcutaneous TID WC  . insulin aspart  0-5 Units Subcutaneous QHS  . insulin aspart  8 Units Subcutaneous TID WC  . insulin glargine  18 Units Subcutaneous BID  . midodrine  10 mg Oral TID WC  . 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 Per med rec and chart records on visit on 9/3, pt is on Warfarin 7.5 mg On Tues/Thur/Sat and 3.75 mg on all other days. Baseline INR subtherapeutic on admission; possible noncompliance.  Today, 07/15/20   INR now therapeutic, increasing at 2.6  Hgb low but improved after PRBC; Plt stable WNL  CT head negative for hemorrhage  No major drug-drug interactions with warfarin; continues on concomitant ASA from PTA  Goal of Therapy:  INR 2-3 Monitor platelets by anticoagulation protocol: Yes   Plan:   Warfarin 4 mg PO x1 tonight  Daily INR   For discharge, recommend resuming PTA dosing with INR check in 3-5 days  Monitor for signs and symptoms of bleeding  Ulice Dash, PharmD,  BCPS [phone] 07/15/2020, 11:13 AM

## 2020-07-16 DIAGNOSIS — F1490 Cocaine use, unspecified, uncomplicated: Secondary | ICD-10-CM | POA: Diagnosis not present

## 2020-07-16 DIAGNOSIS — R471 Dysarthria and anarthria: Secondary | ICD-10-CM | POA: Diagnosis not present

## 2020-07-16 DIAGNOSIS — E1165 Type 2 diabetes mellitus with hyperglycemia: Secondary | ICD-10-CM

## 2020-07-16 DIAGNOSIS — C3490 Malignant neoplasm of unspecified part of unspecified bronchus or lung: Secondary | ICD-10-CM | POA: Diagnosis not present

## 2020-07-16 DIAGNOSIS — F1410 Cocaine abuse, uncomplicated: Secondary | ICD-10-CM

## 2020-07-16 DIAGNOSIS — Z5181 Encounter for therapeutic drug level monitoring: Secondary | ICD-10-CM | POA: Diagnosis not present

## 2020-07-16 DIAGNOSIS — Z794 Long term (current) use of insulin: Secondary | ICD-10-CM

## 2020-07-16 DIAGNOSIS — C3411 Malignant neoplasm of upper lobe, right bronchus or lung: Secondary | ICD-10-CM

## 2020-07-16 LAB — RENAL FUNCTION PANEL
Albumin: 2.9 g/dL — ABNORMAL LOW (ref 3.5–5.0)
Anion gap: 9 (ref 5–15)
BUN: 33 mg/dL — ABNORMAL HIGH (ref 6–20)
CO2: 25 mmol/L (ref 22–32)
Calcium: 9.6 mg/dL (ref 8.9–10.3)
Chloride: 100 mmol/L (ref 98–111)
Creatinine, Ser: 0.9 mg/dL (ref 0.61–1.24)
GFR calc Af Amer: >60 mL/min (ref >60)
GFR calc non Af Amer: >60 mL/min (ref >60)
Glucose, Bld: 305 mg/dL — ABNORMAL HIGH (ref 70–99)
Phosphorus: 2.8 mg/dL (ref 2.5–4.6)
Potassium: 4.5 mmol/L (ref 3.5–5.1)
Sodium: 134 mmol/L — ABNORMAL LOW (ref 135–145)

## 2020-07-16 LAB — CBC
HCT: 33.3 % — ABNORMAL LOW (ref 39.0–52.0)
Hemoglobin: 10.4 g/dL — ABNORMAL LOW (ref 13.0–17.0)
MCH: 29.6 pg (ref 26.0–34.0)
MCHC: 31.2 g/dL (ref 30.0–36.0)
MCV: 94.9 fL (ref 80.0–100.0)
Platelets: 422 10*3/uL — ABNORMAL HIGH (ref 150–400)
RBC: 3.51 MIL/uL — ABNORMAL LOW (ref 4.22–5.81)
RDW: 14.6 % (ref 11.5–15.5)
WBC: 17.4 10*3/uL — ABNORMAL HIGH (ref 4.0–10.5)
nRBC: 0 % (ref 0.0–0.2)

## 2020-07-16 LAB — GLUCOSE, CAPILLARY
Glucose-Capillary: 276 mg/dL — ABNORMAL HIGH (ref 70–99)
Glucose-Capillary: 201 mg/dL — ABNORMAL HIGH (ref 70–99)
Glucose-Capillary: 178 mg/dL — ABNORMAL HIGH (ref 70–99)
Glucose-Capillary: 196 mg/dL — ABNORMAL HIGH (ref 70–99)

## 2020-07-16 LAB — PROTIME-INR
INR: 2.9 — ABNORMAL HIGH (ref 0.8–1.2)
Prothrombin Time: 29.3 seconds — ABNORMAL HIGH (ref 11.4–15.2)

## 2020-07-16 LAB — MAGNESIUM: Magnesium: 2 mg/dL (ref 1.7–2.4)

## 2020-07-16 MED ORDER — INSULIN GLARGINE 100 UNIT/ML ~~LOC~~ SOLN
25.0000 [IU] | Freq: Two times a day (BID) | SUBCUTANEOUS | Status: DC
  Administered 2020-07-16 – 2020-07-18 (×5): 25 [IU] via SUBCUTANEOUS
  Filled 2020-07-16 (×5): qty 0.25

## 2020-07-16 MED ORDER — WARFARIN SODIUM 3 MG PO TABS
3.0000 mg | ORAL_TABLET | Freq: Once | ORAL | Status: AC
  Administered 2020-07-16: 3 mg via ORAL
  Filled 2020-07-16: qty 1

## 2020-07-16 NOTE — Care Management Important Message (Signed)
Important Message  Patient Details IM Letter given to the Patient Name: Dennis [name]. MRN: [id number] Date of Birth: [birth year]   Medicare Important Message Given:  Yes     Kerin Salen 07/16/2020, 1:13 PM

## 2020-07-16 NOTE — Progress Notes (Signed)
PROGRESS NOTE  Dennis [name]. 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  CT head without contrast without acute finding.  Neurology consulted.  Could not do MRI due to ICD.  Repeat CT head with contrast did not reveal CVA but concern about infiltrating enhancement in ventral pons and proximal medulla concerning of metastatic disease.  EEG without epileptiform or seizure activity.  Patient had recurrent syncope with micturition raising concern for vasovagal syncope.  However, his orthostatic vitals were also positive.  Cardiology consulted.  He was started on low-dose midodrine.  Equal wheeze EF of 25 to 30% (previously 15 to 20%), diffuse hypokinesis G2-DD.  Oncology and radiation oncology following as well.   Subjective: Seen and examined earlier this morning.  No major events overnight or this morning.  Continues to endorse lightheadedness when he gets up.  Denies chest pain or dyspnea.  Orthostatic vitals remains positive.   Objective: Vitals:   07/15/20 2107 07/16/20 0510 07/16/20 1225 07/16/20 1321  BP: (!) 162/106 131/90  (!) 146/92  Pulse: 81 74  84  Resp: 20 16  16   Temp: 98.3 F (36.8 C) 98 F (36.7 C)  97.8 F (36.6 C)  TempSrc: Oral Oral  Oral  SpO2: 100% 100%  100%  Weight:   67 kg   Height:        Intake/Output Summary (Last 24 hours) at 07/16/2020 1614 Last data filed at 07/16/2020 1613 Gross per 24 hour  Intake 1110 ml  Output 1150 ml  Net -40 ml   Filed Weights   07/08/20 0337 07/16/20 1225  Weight: 62.1 kg 67 kg    Examination:  GENERAL: No apparent distress.  Nontoxic. HEENT: MMM.   Vision and hearing grossly intact.  NECK: Supple.  No apparent JVD.  RESP:  No IWOB.  Fair aeration bilaterally. CVS:  RRR. Heart sounds normal.  ABD/GI/GU: BS+. Abd soft, NTND.  Abdominal binder. MSK/EXT:  Moves extremities. No apparent deformity. No edema.  TED hose in place. SKIN: no apparent skin lesion or wound NEURO: Awake, alert and oriented appropriately.  Mild dysarthria and pronator drift on the left. PSYCH: Calm. Normal affect.   Procedures:  None  Microbiology summarized: COVID-19 PCR negative. Influenza PCR negative.  Assessment & Plan: Slurred speech/LUE paresthesia/unsteady gait in patient with prior right MCA CVA-CT head with and without contrast did not reveal CVA but possible pontine and medullary infiltrating lesion concerning for metastatic disease.  Unfortunately, his ICD is incompatible with MRI.  Echo EF of 25 to 30% (improved).  EEG without seizure activity.  UDS positive for cocaine.  INR was subtherapeutic on admit.  A1c 10.5%.  LDL 77.  Neuro symptoms improved. -Continue aspirin and warfarin.  INR therapeutic. -Emphasize risk reduction -Continue therapy  Chronic systolic CHF s/p AICD in 05/[id number] euvolemic.  Fair urine output.  Appears euvolemic.  No dyspnea or chest pain. -Cardiology managing -GDMT-Home Delene Loll, Coreg and Aldactone on hold due to syncope and orthostatic hypotension.  Recurrent syncope/orthostatic hypotension-orthostatic vitals positive.  Initially some concern about vasovagal. He reports some hesitancy and sense of incomplete void for months.  Never seen urology.  Also concern about autonomic dysfunction in the setting of his brain mets.  TTE as above.  Cortisol low which is expected while on dexamethasone.  TSH normal. -Cardiology following. -Home cardiac meds on hold -Midodrine increased to 10 mg 3 times daily -Abdominal binder and TED hose -Monitor orthostatic vitals daily  History of CAD with occluded LAD with collaterals-no  anginal symptoms. -Continue aspirin and statin  Small cell lung cancer with brain mets s/p whole brain irradiation: CT head with and without contrast concerning for pontine and medullary infiltrate.  -Oncology following-started on Decadron 4 mg every 12 hours -Per Dr. Mickeal Skinner, CT head with very thin cuts if cardiology can't turn off AICD for MRI.  Per cardiology, ICD approaching ERI. Cardiology to discuss with EP  Substance abuse/recurrent cocaine abuse: UDS positive for cocaine. -Counseled on the importance of quitting cocaine use -TOC consulted.  Essential hypertension: Now with orthostatic hypotension -Cardiac meds and midodrine as above  Uncontrolled DM-2 with hyperglycemia: Fluctuating CBG.  A1c 10.5%.  History of noncompliance. Recent Labs  Lab 07/15/20 1612 07/15/20 2108 07/16/20 0722 07/16/20 1137 07/16/20 1612  GLUCAP 270* 178* 276* 201* 178*  -Continue SSI-high -Increase Lantus from 18 to 25 units twice daily -Continue Lovenox 8 units AC -Continue statin  Hyperlipidemia: LDL 77. -Continue with Lipitor 20 mg daily.  Lethargy/possible delirium: Resolved. -Avoid sedating medications -Reorientation and delirium precautions  Anemia of chronic disease: H&H stable at baseline.  Anemia panel basically normal. -Continue monitoring  Leukocytosis: Likely demargination from steroid -Continue monitoring  Debility/physical deconditioning -PT/OT  Moderate malnutrition-as evidenced by low BMI and significant muscle mass and subcu fat loss. -Appreciate dietitian input Body mass index is 20.03 kg/m. Nutrition Problem: Increased nutrient needs Etiology: cancer and cancer related treatments (CHF, substance abuse) Signs/Symptoms: estimated needs Interventions: Ensure Enlive (each supplement provides 350kcal and 20 grams of protein)   DVT prophylaxis:  Place TED hose Start: 07/10/20 1054 SCD's Start: 07/08/20 0127    Code Status: Full code Family Communication:  Patient and/or RN.  None at bedside.  Status is: Inpatient  Remains inpatient appropriate because:Hemodynamically unstable, Unsafe d/c plan and Inpatient level of care appropriate due to severity of illness   Dispo: The patient is from: Home              Anticipated d/c is to: Home              Anticipated d/c date is: 3 days              Patient currently is not medically stable to d/c.       Consultants:  Cardiology Oncology Neurology Radiation oncology   Sch Meds:  Scheduled Meds: . aspirin EC  81 mg Oral Daily  . atorvastatin  20 mg Oral Daily  . dexamethasone  4 mg Oral Q12H  . feeding supplement (ENSURE ENLIVE)  237 mL Oral BID BM  . insulin aspart  0-20 Units Subcutaneous TID WC  . insulin aspart  0-5 Units Subcutaneous QHS  . insulin aspart  8 Units Subcutaneous TID WC  . insulin glargine  25 Units Subcutaneous BID  . midodrine  10 mg Oral TID WC  . Warfarin - Pharmacist Dosing Inpatient   Does not apply q1600   Continuous Infusions: PRN Meds:.acetaminophen **OR** acetaminophen (TYLENOL) oral liquid 160 mg/5 mL **OR** acetaminophen, ondansetron (ZOFRAN) IV, polyethylene glycol, senna-docusate  Antimicrobials: Anti-infectives (From admission, onward)   None       I have personally reviewed the following labs  and images: CBC: Recent Labs  Lab 07/10/20 0451 07/11/20 0509 07/12/20 0504 07/14/20 0626 07/16/20 0543  WBC 7.4 6.7 10.2  --  17.4*  NEUTROABS  --  5.5  --   --   --   HGB 7.2* 9.3* 9.4* 9.7* 10.4*  HCT 22.5* 31.0* 29.8* 31.9* 33.3*  MCV 90.0 95.1 93.4  --  94.9  PLT 332 352 384  --  422*   BMP &GFR Recent Labs  Lab 07/10/20 0451 07/11/20 0509 07/16/20 0543  NA 136 137 134*  K 3.8 4.6 4.5  CL 103 103 100  CO2 24 25 25   GLUCOSE 65* 97 305*  BUN 18 18 33*  CREATININE 0.96 0.95 0.90  CALCIUM 8.6* 9.1 9.6  MG  --   --  2.0  PHOS  --   --  2.8   Estimated Creatinine Clearance: 84.8 mL/min (by C-G formula based on SCr of 0.9  mg/dL). Liver & Pancreas: Recent Labs  Lab 07/16/20 0543  ALBUMIN 2.9*   No results for input(s): LIPASE, AMYLASE in the last 168 hours. Recent Labs  Lab 07/10/20 1057  AMMONIA 26   Diabetic: No results for input(s): HGBA1C in the last 72 hours. Recent Labs  Lab 07/15/20 1612 07/15/20 2108 07/16/20 0722 07/16/20 1137 07/16/20 1612  GLUCAP 270* 178* 276* 201* 178*   Cardiac Enzymes: No results for input(s): CKTOTAL, CKMB, CKMBINDEX, TROPONINI in the last 168 hours. No results for input(s): PROBNP in the last 8760 hours. Coagulation Profile: Recent Labs  Lab 07/12/20 0504 07/13/20 0519 07/14/20 0626 07/15/20 0622 07/16/20 0543  INR 1.8* 2.1* 2.3* 2.6* 2.9*   Thyroid Function Tests: No results for input(s): TSH, T4TOTAL, FREET4, T3FREE, THYROIDAB in the last 72 hours. Lipid Profile: No results for input(s): CHOL, HDL, LDLCALC, TRIG, CHOLHDL, LDLDIRECT in the last 72 hours. Anemia Panel: No results for input(s): VITAMINB12, FOLATE, FERRITIN, TIBC, IRON, RETICCTPCT in the last 72 hours. Urine analysis:    Component Value Date/Time   COLORURINE YELLOW 07/07/2020 1628   APPEARANCEUR CLEAR 07/07/2020 1628   LABSPEC 1.014 07/07/2020 1628   PHURINE 5.0 07/07/2020 1628   GLUCOSEU 50 (A) 07/07/2020 1628   HGBUR NEGATIVE 07/07/2020 1628   BILIRUBINUR NEGATIVE 07/07/2020 1628   KETONESUR NEGATIVE 07/07/2020 1628   PROTEINUR 100 (A) 07/07/2020 1628   UROBILINOGEN 1.0 07/16/2015 1715   NITRITE NEGATIVE 07/07/2020 1628   LEUKOCYTESUR NEGATIVE 07/07/2020 1628   Sepsis Labs: Invalid input(s): PROCALCITONIN, [place]  Microbiology: Recent Results (from the past 240 hour(s))  Respiratory Panel by RT PCR (Flu A&B, Covid) - Nasopharyngeal Swab     Status: None   Collection Time: 07/07/20  9:15 PM   Specimen: Nasopharyngeal Swab  Result Value Ref Range Status   SARS Coronavirus 2 by RT PCR NEGATIVE NEGATIVE Final    Comment: (NOTE) SARS-CoV-2 target nucleic acids are  NOT DETECTED.  The SARS-CoV-2 RNA is generally detectable in upper respiratoy specimens during the acute phase of infection. The lowest concentration of SARS-CoV-2 viral copies this assay can detect is 131 copies/mL. A negative result does not preclude SARS-Cov-2 infection and should not be used as the sole basis for treatment or other patient management decisions. A negative result may occur with  improper specimen collection/handling, submission of specimen other than nasopharyngeal swab, presence of viral mutation(s) within the areas targeted by this assay, and inadequate number of viral copies (<131 copies/mL). A negative result must be combined with clinical observations, patient history, and epidemiological information. The expected  result is Negative.  Fact Sheet for Patients:  PinkCheek.be  Fact Sheet for Healthcare Providers:  GravelBags.it  This test is no t yet approved or cleared by the [place] FDA and  has been authorized for detection and/or diagnosis of SARS-CoV-2 by FDA under an Emergency Use Authorization (EUA). This EUA will remain  in effect (meaning this test can be used) for the duration of the COVID-19 declaration under Section 564(b)(1) of the Act, 21 U.S.C. section 360bbb-3(b)(1), unless the authorization is terminated or revoked sooner.     Influenza A by PCR NEGATIVE NEGATIVE Final   Influenza B by PCR NEGATIVE NEGATIVE Final    Comment: (NOTE) The Xpert Xpress SARS-CoV-2/FLU/RSV assay is intended as an aid in  the diagnosis of influenza from Nasopharyngeal swab specimens and  should not be used as a sole basis for treatment. Nasal washings and  aspirates are unacceptable for Xpert Xpress SARS-CoV-2/FLU/RSV  testing.  Fact Sheet for Patients: PinkCheek.be  Fact Sheet for Healthcare Providers: GravelBags.it  This test is not yet  approved or cleared by the [place] FDA and  has been authorized for detection and/or diagnosis of SARS-CoV-2 by  FDA under an Emergency Use Authorization (EUA). This EUA will remain  in effect (meaning this test can be used) for the duration of the  Covid-19 declaration under Section 564(b)(1) of the Act, 21  U.S.C. section 360bbb-3(b)(1), unless the authorization is  terminated or revoked. Performed at [facility], [place] [address] [place], [place] [address]     Radiology Studies: No results found.   Rylah Fukuda T. [place]  If 7PM-7AM, please contact night-coverage [email or web address] 07/16/2020, 4:14 PM

## 2020-07-16 NOTE — Progress Notes (Signed)
[facility] for Warfarin Indication: advanced (noncompaction) cardiomyopathy  No Known Allergies  Patient Measurements: Height: 6' (182.9 cm) Weight: 62.1 kg (137 lb) IBW/kg (Calculated) : 77.6  Vital Signs: Temp: 98 F (36.7 C) (10/04 0510) Temp Source: Oral (10/04 0510) BP: 131/90 (10/04 0510) Pulse Rate: 74 (10/04 0510)  Labs: Recent Labs    07/14/20 0626 07/15/20 0622 07/16/20 0543  HGB 9.7*  --  10.4*  HCT 31.9*  --  33.3*  PLT  --   --  422*  LABPROT 24.7* 26.8* 29.3*  INR 2.3* 2.6* 2.9*  CREATININE  --   --  0.90    Estimated Creatinine Clearance: 78.6 mL/min (by C-G formula based on SCr of 0.9 mg/dL).  Medications:  Scheduled:  . aspirin EC  81 mg Oral Daily  . atorvastatin  20 mg Oral Daily  . dexamethasone  4 mg Oral Q12H  . feeding supplement (ENSURE ENLIVE)  237 mL Oral BID BM  . insulin aspart  0-20 Units Subcutaneous TID WC  . insulin aspart  0-5 Units Subcutaneous QHS  . insulin aspart  8 Units Subcutaneous TID WC  . insulin glargine  25 Units Subcutaneous BID  . midodrine  10 mg Oral TID WC  . 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 Per med rec and chart records on visit on 9/3, pt is on Warfarin 7.5 mg On Tues/Thur/Sat and 3.75 mg on all other days. Baseline INR subtherapeutic on admission; possible noncompliance.  Today, 07/16/20   INR now therapeutic, increasing at 2.9  Hgb low but improved after PRBC, now 10.4 ; Plt stable WNL  CT head negative for hemorrhage  No major drug-drug interactions with warfarin; continues on concomitant ASA from PTA  Goal of Therapy:  INR 2-3 Monitor platelets by anticoagulation protocol: Yes   Plan:   Warfarin 3 mg PO x1 tonight  Daily INR   For discharge, recommend resuming PTA dosing with INR check in 3-5  days  Monitor for signs and symptoms of bleeding   Royetta Asal, PharmD, BCPS 07/16/2020 11:11 AM

## 2020-07-16 NOTE — Progress Notes (Addendum)
Progress Note  Patient Name: Dennis [name]. Date of Encounter: 07/16/2020  Primary Cardiologist: Glori Bickers, MD  Subjective    Reports feeling better today. Still reports occasional dizziness upon standing. BP dropped to 71/35 with standing with abdominal binder and compression hose on but patient states, "it wasn't bad."  Inpatient Medications    Scheduled Meds: . aspirin EC  81 mg Oral Daily  . atorvastatin  20 mg Oral Daily  . dexamethasone  4 mg Oral Q12H  . feeding supplement (ENSURE ENLIVE)  237 mL Oral BID BM  . insulin aspart  0-20 Units Subcutaneous TID WC  . insulin aspart  0-5 Units Subcutaneous QHS  . insulin aspart  8 Units Subcutaneous TID WC  . insulin glargine  25 Units Subcutaneous BID  . midodrine  10 mg Oral TID WC  . warfarin  3 mg Oral ONCE-1600  . Warfarin - Pharmacist Dosing Inpatient   Does not apply q1600   Continuous Infusions:  PRN Meds: acetaminophen **OR** acetaminophen (TYLENOL) oral liquid 160 mg/5 mL **OR** acetaminophen, ondansetron (ZOFRAN) IV, polyethylene glycol, senna-docusate   Vital Signs    Vitals:   07/15/20 0649 07/15/20 1227 07/15/20 2107 07/16/20 0510  BP: 113/83 112/86 (!) 162/106 131/90  Pulse: 79 84 81 74  Resp: 18 18 20 16   Temp: 98 F (36.7 C) 98.4 F (36.9 C) 98.3 F (36.8 C) 98 F (36.7 C)  TempSrc:  Oral Oral Oral  SpO2: 100% 99% 100% 100%  Weight:      Height:        Intake/Output Summary (Last 24 hours) at 07/16/2020 1219 Last data filed at 07/16/2020 0900 Gross per 24 hour  Intake 510 ml  Output 750 ml  Net -240 ml   Last 3 Weights 07/08/2020 06/20/2020 05/23/2020  Weight (lbs) 137 lb 144 lb 14.4 oz 150 lb 8 oz  Weight (kg) 62.143 kg 65.726 kg 68.266 kg  Some encounter information is confidential and restricted. Go to Review Flowsheets activity to see all data.     Telemetry    NSR occasional PVC - Personally Reviewed   Physical Exam   GEN: No acute distress, cachectic appearing HEENT:  Normocephalic, atraumatic, sclera non-icteric. Neck: No JVD or bruits. Cardiac: RRR no murmurs, rubs, or gallops.  Radials/DP/PT 1+ and equal bilaterally.  Respiratory: Clear to auscultation bilaterally. Breathing is unlabored. GI: Soft, nontender, non-distended, BS +x 4. MS: no deformity. Extremities: No clubbing or cyanosis. No edema. Distal pedal pulses are 2+ and equal bilaterally. Compression hose in place Neuro:  AAOx3. Follows commands. Psych:  Responds to questions appropriately with a normal affect.  Labs    High Sensitivity Troponin:  No results for input(s): TROPONINIHS in the last 720 hours.    Cardiac EnzymesNo results for input(s): TROPONINI in the last 168 hours. No results for input(s): TROPIPOC in the last 168 hours.   Chemistry Recent Labs  Lab 07/10/20 0451 07/11/20 0509 07/16/20 0543  NA 136 137 134*  K 3.8 4.6 4.5  CL 103 103 100  CO2 24 25 25   GLUCOSE 65* 97 305*  BUN 18 18 33*  CREATININE 0.96 0.95 0.90  CALCIUM 8.6* 9.1 9.6  ALBUMIN  --   --  2.9*  GFRNONAA >60 >60 >60  GFRAA >60 >60 >60  ANIONGAP 9 9 9      Hematology Recent Labs  Lab 07/11/20 0509 07/11/20 0509 07/12/20 0504 07/14/20 0626 07/16/20 0543  WBC 6.7  --  10.2  --  17.4*  RBC 3.26*  --  3.19*  --  3.51*  HGB 9.3*   < > 9.4* 9.7* 10.4*  HCT 31.0*   < > 29.8* 31.9* 33.3*  MCV 95.1  --  93.4  --  94.9  MCH 28.5  --  29.5  --  29.6  MCHC 30.0  --  31.5  --  31.2  RDW 13.6  --  13.5  --  14.6  PLT 352  --  384  --  422*   < > = values in this interval not displayed.    BNPNo results for input(s): BNP, PROBNP in the last 168 hours.   DDimer No results for input(s): DDIMER in the last 168 hours.   Radiology    No results found.  Cardiac Studies   2D echo 07/09/20  1. Since the last exam on 12/25/2019 LVEF has slightly improved from  15-20% to 25-30% with diffuse hypokinesis.  2. Left ventricular ejection fraction, by estimation, is 25 to 30%. The  left ventricle has  severely decreased function. The left ventricle  demonstrates global hypokinesis. The left ventricular internal cavity size  was moderately dilated. There is mild  concentric left ventricular hypertrophy. Left ventricular diastolic  parameters are consistent with Grade II diastolic dysfunction  (pseudonormalization). Elevated left atrial pressure.  3. Right ventricular systolic function is normal. The right ventricular  size is normal. There is normal pulmonary artery systolic pressure.  4. A small pericardial effusion is present. The pericardial effusion is  posterior to the left ventricle.  5. The mitral valve is normal in structure. Mild mitral valve  regurgitation. No evidence of mitral stenosis.  6. The aortic valve is normal in structure. Aortic valve regurgitation is  trivial. No aortic stenosis is present.  7. The inferior vena cava is normal in size with greater than 50%  respiratory variability, suggesting right atrial pressure of 3 mmHg.   Patient Profile     59 y.o. 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 dual chamber ICD, prior stroke in 2016, HLD, DM2, polysubstance abuse (tobacco, cocaine, alcohol), small cell lung CA with brain mets (dx 12/2019) s/p whole brain radiation and undergoing chemotherapy, and noncompliance who was admitted with slurred speech, LUE parasthesia, unsteady gait. CT head with and without contrast did not reveal CVA but possible pontine and medullary infiltrating lesion concerning for metastatic disease.  Unfortunately, his ICD is incompatible with MRI. EEG without seizure activity. UDS positive for cocaine. During admission he was also noted to have episodes of recurrent syncope while voiding without corresponding arrhythmias on telemetry (see consult note 9/27 for details) - patient  also reported episodic syncope at home. ICD interrogation without any atrial or ventricular arrhythmias, device not scheduled to record anything less than 214bpm, 3 months left to EOL. Patient also found to have significant orthostasis.  Assessment & Plan    1. Slurred speech/LUE parasthesia/unsteady gait - CT head with contrast showed infiltrating enhancement in the ventral pons concerning for metastatic disease, no hemorrhage. MRI recommended but unable to do due to ICD (TT confirmed with [employer] rep) -  EEG without seizure activity. UDS positive for cocaine. Has anemia of chronic disease. INR was subtherapeutic on admit. A1c 10.5% indicating poorly controlled diabetes.  - Oncology evaluated patient earlier this admission - they started Decadron with plan for multidisciplinary  conference today per IM notes   2. Recurrent syncope, suspected due to severe orthostasis - per notes earlier this admission, ICD interrogation without any atrial or ventricular arrhythmias, device not scheduled to record anything less than 214bpm, 3 months left to EOL - now on midodrine, TED hose, abdominal binder - ?whether component of autonomic neuropathy from DM plays a role, also question relationship to brain mets - orthostatics BP 123/85->71/35 with standing with associated mild dizziness but no recurrent syncope - prior notes indicate consideration of mestinon +/- urology consult for voiding issues (has to strain)  3. Chronic systolic CHF with ventricular noncompaction - on warfarin per pharmacy - guideline directed medical therapy on hold due to severe orthostasis this admission (Entresto, carvedilol, spironolactone) - repeat echo 07/09/20 with EF 25-30%, slight improvement from prior, grade 2 DD, small pericardial effusion, mild MR - will add daily weights to trend but volume status looks OK today  4. CAD with occluded LAD - no angina, continues on ASA/statin at this time - will discuss ASA with MD given  concomitant warfarin and brain mets  5. Polysubstance abuse with recurrent cocaine use - counseled on importance of cessation, any recurrent use will be very dangerous for him given comorbidities  For questions or updates, please contact [place] Please consult [email or web address] for contact info under Cardiology/STEMI.  Signed, Charlie Pitter, PA-C 07/16/2020, 12:19 PM    I have seen and examined the patient along with Charlie Pitter, PA-C.  I have reviewed the chart, notes and new data.  I agree with PA/NP's note.  Key new complaints: recurrent syncope, preceded by dizziness, at times micturition related. Severe orthostatic hypotension.  Key examination changes: comfortable lying flat, not dizzy sitting up. Profound orthostatic hypotension standing, but asymptomatic. Clear lungs. RRR. Key new findings / data: ICD check shows 3.8 months to ERI, no recent arrhythmia (has frequent brief episodes of atrial lead "noise", but otherwise normal function). He does not require A or V pacing (backup pacing at 40 bpm) and has not had therapy ever delivered. [facility]vue reports "wet" x 2 days before admission. No bradycardia on telemetry.  PLAN: Not a candidate for volume expansion therapy. HF meds stopped. On midodrine with symptom improvement (but still severe orthostatic hypotension). Note ICD approaching ERI. With his advanced small cell lung Ca it may be appropriate to not perform generator change (has not required pacing and has not received tachy therapies, limited life expectancy). He should think about this before his next visit with Dr. Lovena Le.  Sanda Klein, MD, [place] [phone] 07/16/2020, 1:25 PM

## 2020-07-16 NOTE — Progress Notes (Signed)
Inpatient Diabetes Program Recommendations  AACE/ADA: New Consensus Statement on Inpatient Glycemic Control (2015)  Target Ranges:  Prepandial:   less than 140 mg/dL      Peak postprandial:   less than 180 mg/dL (1-2 hours)      Critically ill patients:  140 - 180 mg/dL   Lab Results  Component Value Date   GLUCAP 276 (H) 07/16/2020   HGBA1C 10.5 (H) 07/08/2020    Review of Glycemic Control  Diabetes history: DM 2 Outpatient Diabetes medications: Lantus 36 units, Metformin 1000 mg Daily Current orders for Inpatient glycemic control:  Lantus 25 units bid Novolog 0-20 units tiod + hs Novolog 8 units tid mc  Ensure Enlive bid between meals Decadron 4 mg Q12 hours  A1c 10.5% this admission PCP:   Sees Reeves Dam, NP at [facility] [place] [facility] with pt at bedside regarding A1c level. Pt reports last seeing his PCP about 1 year ago. Pt reports getting his insulin through the clinic in [facility] and takes his insulin everyday. Pt reports not checking his glucose everyday. Encouraged pt to check glucose everyday. Discussed importance of glucose control and lifestyle. Encouraged pt to make a follow up appointment for medication adjustments after the hospital.  Thanks,  Tama Headings RN, MSN, BC-ADM Inpatient Diabetes Coordinator Team Pager [phone] (8a-5p)

## 2020-07-16 NOTE — Plan of Care (Signed)
  Problem: Activity: Goal: Risk for activity intolerance will decrease Outcome: Progressing   Problem: Nutrition: Goal: Adequate nutrition will be maintained Outcome: Progressing   Problem: Elimination: Goal: Will not experience complications related to urinary retention Outcome: Progressing   

## 2020-07-16 NOTE — Progress Notes (Signed)
Physical Therapy Treatment Patient Details Name: Dennis [name]. MRN: [id number] DOB: 1961/04/29 Today's Date: 07/16/2020    History of Present Illness 59 yo male admitted with unsteady gait, slurred speech. Hx of CHF, DM, CVA, pacemaker, MDD, metastatic lung ca, polysubstance abuse    PT Comments    Pt very motivated and agreeable to PT.  Reviewed LE exercises in supine and sitting.  Pt denies dizziness in sitting. BP sitting EOB 96/70, HR 95; BP after transfer to recliner/sitting upright 101/78, HR 90s. Will continue to follow   Follow Up Recommendations  Supervision for mobility/OOB;Home health PT     Equipment Recommendations  None recommended by PT    Recommendations for Other Services       Precautions / Restrictions Precautions Precautions: Fall Precaution Comments: ABD binder and Thigh Highs for Orthostatic BP's    Mobility  Bed Mobility Overal bed mobility: Modified Independent                Transfers   Equipment used: None Transfers: Sit to/from [facility] Sit to Stand: Min guard Stand pivot transfers: Min guard       General transfer comment: min guard for all mobility for safety secondary to orthostatic HTN. No complaints of dizziness or weakness with activity, sitting or standing.    Ambulation/Gait             General Gait Details: NT d/t orthostasis    Stairs             Wheelchair Mobility    Modified Rankin (Stroke Patients Only)       Balance   Sitting-balance support: No upper extremity supported;Feet supported Sitting balance-Leahy Scale: Good       Standing balance-Leahy Scale: Fair                              Cognition Arousal/Alertness: Awake/alert Behavior During Therapy: WFL for tasks assessed/performed Overall Cognitive Status: Within Functional Limits for tasks assessed                                 General Comments: AxO x 3 very nice      Exercises  General Exercises - Lower Extremity Ankle Circles/Pumps: AROM;Both;10 reps Long Arc Quad: AROM;AAROM;Both;Seated;15 reps (A/AAROM with hamstring stretch on L ) Heel Slides: AROM;Supine;15 reps Straight Leg Raises: AROM;Both;10 reps;Supine Hip Flexion/Marching: AROM;Both;15 reps;Seated    General Comments        Pertinent Vitals/Pain Pain Assessment: No/denies pain    Home Living                      Prior Function            PT Goals (current goals can now be found in the care plan section) Acute Rehab PT Goals Patient Stated Goal: get well PT Goal Formulation: With patient Time For Goal Achievement: 07/22/20 Potential to Achieve Goals: Good Progress towards PT goals: Progressing toward goals    Frequency    Min 3X/week      PT Plan Current plan remains appropriate    Co-evaluation              AM-PAC PT "6 Clicks" Mobility   Outcome Measure  Help needed turning from your back to your side while in a flat bed without using bedrails?: None Help needed moving from lying  on your back to sitting on the side of a flat bed without using bedrails?: A Little Help needed moving to and from a bed to a chair (including a wheelchair)?: A Little Help needed standing up from a chair using your arms (e.g., wheelchair or bedside chair)?: A Little Help needed to walk in hospital room?: A Lot Help needed climbing 3-5 steps with a railing? : A Lot 6 Click Score: 17    End of Session   Activity Tolerance: Patient tolerated treatment well Patient left: in chair;with call bell/phone within reach;with chair alarm set Nurse Communication: Mobility status PT Visit Diagnosis: Difficulty in walking, not elsewhere classified (R26.2)     Time: 5732-2025 PT Time Calculation (min) (ACUTE ONLY): 10 min  Charges:  $Therapeutic Exercise: 8-22 mins                     Baxter Flattery, PT  Acute Rehab Dept ([place]) [phone] Pager  [phone]  07/16/2020    [facility] 07/16/2020, 3:02 PM

## 2020-07-17 DIAGNOSIS — R471 Dysarthria and anarthria: Secondary | ICD-10-CM | POA: Diagnosis not present

## 2020-07-17 DIAGNOSIS — R2681 Unsteadiness on feet: Secondary | ICD-10-CM | POA: Diagnosis not present

## 2020-07-17 DIAGNOSIS — Z95810 Presence of automatic (implantable) cardiac defibrillator: Secondary | ICD-10-CM | POA: Diagnosis not present

## 2020-07-17 DIAGNOSIS — C3490 Malignant neoplasm of unspecified part of unspecified bronchus or lung: Secondary | ICD-10-CM | POA: Diagnosis not present

## 2020-07-17 DIAGNOSIS — I5022 Chronic systolic (congestive) heart failure: Secondary | ICD-10-CM | POA: Diagnosis not present

## 2020-07-17 DIAGNOSIS — F1490 Cocaine use, unspecified, uncomplicated: Secondary | ICD-10-CM | POA: Diagnosis not present

## 2020-07-17 DIAGNOSIS — F1410 Cocaine abuse, uncomplicated: Secondary | ICD-10-CM | POA: Diagnosis not present

## 2020-07-17 DIAGNOSIS — Z5181 Encounter for therapeutic drug level monitoring: Secondary | ICD-10-CM | POA: Diagnosis not present

## 2020-07-17 LAB — PROTIME-INR
INR: 2.8 — ABNORMAL HIGH (ref 0.8–1.2)
Prothrombin Time: 28.8 seconds — ABNORMAL HIGH (ref 11.4–15.2)

## 2020-07-17 LAB — GLUCOSE, CAPILLARY
Glucose-Capillary: 236 mg/dL — ABNORMAL HIGH (ref 70–99)
Glucose-Capillary: 219 mg/dL — ABNORMAL HIGH (ref 70–99)
Glucose-Capillary: 218 mg/dL — ABNORMAL HIGH (ref 70–99)
Glucose-Capillary: 168 mg/dL — ABNORMAL HIGH (ref 70–99)

## 2020-07-17 MED ORDER — WARFARIN SODIUM 2.5 MG PO TABS
3.7500 mg | ORAL_TABLET | Freq: Once | ORAL | Status: AC
  Administered 2020-07-17: 3.75 mg via ORAL
  Filled 2020-07-17: qty 1

## 2020-07-17 NOTE — Progress Notes (Signed)
Inpatient Diabetes Program Recommendations  AACE/ADA: New Consensus Statement on Inpatient Glycemic Control (2015)  Target Ranges:  Prepandial:   less than 140 mg/dL      Peak postprandial:   less than 180 mg/dL (1-2 hours)      Critically ill patients:  140 - 180 mg/dL   Lab Results  Component Value Date   GLUCAP 236 (H) 07/17/2020   HGBA1C 10.5 (H) 07/08/2020    Review of Glycemic Control Results for Dennis [name], Dennis [name] (MRN [id number]) as of 07/17/2020 10:35  Ref. Range 07/16/2020 07:22 07/16/2020 11:37 07/16/2020 16:12 07/16/2020 20:54 07/17/2020 07:35  Glucose-Capillary Latest Ref Range: 70 - 99 mg/dL 276 (H) 201 (H) 178 (H) 196 (H) 236 (H)   Diabetes history: DM 2 Outpatient Diabetes medications: Lantus 36 units, Metformin 1000 mg Daily Current orders for Inpatient glycemic control:  Lantus 25 units bid Novolog 0-20 units tiod + hs Novolog 8 units tid mc  Ensure Enlive bid between meals Decadron 4 mg Q12 hours  A1c 10.5% this admission PCP:   Sees Reeves Dam, NP at [facility]  -  Consider increasing Lantus to 30 units bid   Thanks,  Tama Headings RN, MSN, BC-ADM Inpatient Diabetes Coordinator Team Pager [phone] (8a-5p)

## 2020-07-17 NOTE — Plan of Care (Signed)
  Problem: Education: Goal: Knowledge of General Education information will improve Description: Including pain rating scale, medication(s)/side effects and non-pharmacologic comfort measures Outcome: Progressing   Problem: Clinical Measurements: Goal: Will remain free from infection Outcome: Progressing   Problem: Nutrition: Goal: Adequate nutrition will be maintained Outcome: Progressing   Problem: Coping: Goal: Level of anxiety will decrease Outcome: Progressing   Problem: Elimination: Goal: Will not experience complications related to urinary retention Outcome: Progressing

## 2020-07-17 NOTE — Progress Notes (Signed)
[place] for Warfarin Indication: advanced (noncompaction) cardiomyopathy  No Known Allergies  Patient Measurements: Height: 6' (182.9 cm) Weight: 69.1 kg (152 lb 5.4 oz) IBW/kg (Calculated) : 77.6  Vital Signs: Temp: 97.5 F (36.4 C) (10/05 0454) Temp Source: Oral (10/05 0454) BP: 119/87 (10/05 0454) Pulse Rate: 77 (10/05 0454)  Labs: Recent Labs    07/15/20 0622 07/16/20 0543 07/17/20 0525  HGB  --  10.4*  --   HCT  --  33.3*  --   PLT  --  422*  --   LABPROT 26.8* 29.3* 28.8*  INR 2.6* 2.9* 2.8*  CREATININE  --  0.90  --     Estimated Creatinine Clearance: 87.4 mL/min (by C-G formula based on SCr of 0.9 mg/dL).  Medications:  Scheduled:  . aspirin EC  81 mg Oral Daily  . atorvastatin  20 mg Oral Daily  . dexamethasone  4 mg Oral Q12H  . feeding supplement (ENSURE ENLIVE)  237 mL Oral BID BM  . insulin aspart  0-20 Units Subcutaneous TID WC  . insulin aspart  0-5 Units Subcutaneous QHS  . insulin aspart  8 Units Subcutaneous TID WC  . insulin glargine  25 Units Subcutaneous BID  . midodrine  10 mg Oral TID WC  . 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 Per med rec and chart records on visit on 9/3, pt is on Warfarin 7.5 mg On Tues/Thur/Sat and 3.75 mg on all other days. Baseline INR subtherapeutic on admission; possible noncompliance.  Today, 07/17/20   INR now therapeutic, stable at 2.8  Hgb yesterday low but improved after PRBC, now 10.4 ; Plt stable WNL  CT head negative for hemorrhage  No major drug-drug interactions with warfarin; continues on concomitant ASA from PTA  Goal of Therapy:  INR 2-3 Monitor platelets by anticoagulation protocol: Yes   Plan:   Warfarin 3.75 mg PO x1 tonight  Daily INR   For discharge, recommend resuming PTA dosing with INR  check in 3-5 days  Monitor for signs and symptoms of bleeding   Peggyann Juba, PharmD, BCPS Pharmacy: [phone] 07/17/2020 10:13 AM

## 2020-07-17 NOTE — Progress Notes (Signed)
PROGRESS NOTE  Dennis [name]. ION:[id number] DOB: Oct 06, 1961   PCP: [name], MD  Patient is from: Home  DOA: 07/07/2020 LOS: 8  Brief Narrative / Interim history: 59 year old male with history of right MCA CVA, small cell lung cancer with brain mets s/p whole brain radiation, chronic CHF s/p ICD in 12/2019, IDDM-2 and cocaine use presenting with unsteady gait, slurred speech and LUE numbness admitted with concern for acute on chronic CVA.  CT head without contrast without acute finding.  Neurology consulted.  Could not do MRI due to ICD.  Repeat CT head with contrast did not reveal CVA but concern about infiltrating enhancement in ventral pons and proximal medulla concerning of metastatic disease.  EEG without epileptiform or seizure activity.  Patient with significant orthostatic hypotension even after holding cardiac meds, starting midodrine, TED hose and abdominal binder.  Initially there was some vasovagal element to his orthostasis but vasovagal issues seem to have resolved.  Echo with EF of 25 to 30% (previously 15 to 20%), diffuse hypokinesis G2-DD.  Cardiology following.  Radiation oncology consulted by oncology, and hoping to do MRI brain for better info about infiltrating enhancement in midbrain. However, patient has AICD which is " MRI conditional" and " not FDA approved". Cardiology willing to turn off AICD if radiology is willing to do MRI brain. If note, radiation oncology would like to get special CT with thin cuts.   Subjective: Seen and examined earlier this morning.  No major events overnight of this morning.  He did not feel lightheaded when he got up to brush his teeth today.  Denies chest pain, shortness of breath, GI or UTI symptoms.  Orthostatic vitals were not done today.  Objective: Vitals:   07/16/20 2100 07/17/20 0454 07/17/20 0532 07/17/20 1147  BP: (!) 163/103 119/87  106/81  Pulse: 81 77  83  Resp: 19 18  20   Temp: 97.8 F (36.6 C) (!) 97.5 F (36.4 C)   (!) 97.3 F (36.3 C)  TempSrc: Oral Oral  Oral  SpO2: 100% 100%  100%  Weight:   69.1 kg   Height:        Intake/Output Summary (Last 24 hours) at 07/17/2020 1430 Last data filed at 07/17/2020 0135 Gross per 24 hour  Intake -  Output 525 ml  Net -525 ml   Filed Weights   07/08/20 0337 07/16/20 1225 07/17/20 0532  Weight: 62.1 kg 67 kg 69.1 kg    Examination:  GENERAL: No apparent distress.  Nontoxic. HEENT: MMM.  Vision and hearing grossly intact.  NECK: Supple.  No apparent JVD.  RESP:  No IWOB.  Fair aeration bilaterally. CVS:  RRR. Heart sounds normal.  ABD/GI/GU: BS+. Abd soft, NTND.  MSK/EXT:  Moves extremities. No apparent deformity. No edema.  SKIN: no apparent skin lesion or wound NEURO: Awake, alert and oriented appropriately.  Mild dysarthria and pronator drift on left PSYCH: Calm. Normal affect.  Procedures:  None  Microbiology summarized: COVID-19 PCR negative. Influenza PCR negative.  Assessment & Plan: Slurred speech/LUE paresthesia/unsteady gait in patient with prior right MCA CVA-CT head with and without contrast did not reveal CVA but possible pontine and medullary infiltrating lesion concerning for metastatic disease.  Unfortunately, his ICD is incompatible with MRI.  Echo EF of 25 to 30% (improved).  EEG without seizure activity.  UDS positive for cocaine.  INR was subtherapeutic on admit.  A1c 10.5%.  LDL 77.  Neuro symptoms improved. -Continue aspirin and warfarin.  INR therapeutic. -Emphasize  risk reduction -Continue therapy  Chronic systolic CHF s/p AICD in 05/8501-DXAJOIN euvolemic.  Fair urine output.  Appears euvolemic.  No dyspnea or chest pain. -Cardiology managing -GDMT-Home Delene Loll, Coreg and Aldactone on hold due to syncope and orthostatic hypotension.  Recurrent syncope/orthostatic hypotension-orthostatic vitals positive.  Initially some concern about vasovagal. He reports some hesitancy and sense of incomplete void for months.  Never  seen urology.  Also concern about autonomic dysfunction in the setting of his brain mets.  TTE as above.  Cortisol low which is expected while on dexamethasone.  TSH normal. -Cardiology following-cardiac meds on hold, on midodrine10 mg 3 times daily, TED hose and abdominal binder -Monitor orthostatic vitals daily  History of CAD with occluded LAD with collaterals-no anginal symptoms. -Continue aspirin and statin  Small cell lung cancer with brain mets s/p whole brain irradiation: CT head with and without contrast concerning for pontine and medullary infiltrate.  -On Decadron 4 mg every 12 hours per oncology -Per Dr. Mickeal Skinner with rad onc, MRI brain or CT scan with thin cuts. Card to turn off AICD if radiology willing to do MRI.  Substance abuse/recurrent cocaine abuse: UDS positive for cocaine. -Counseled on the importance of quitting cocaine use -TOC consulted.  Essential hypertension: Now with orthostatic hypotension -Cardiac meds and midodrine as above  Uncontrolled DM-2 with hyperglycemia: Fluctuating CBG.  A1c 10.5%.  History of noncompliance. Recent Labs  Lab 07/16/20 1137 07/16/20 1612 07/16/20 2054 07/17/20 0735 07/17/20 1144  GLUCAP 201* 178* 196* 236* 219*  -Continue SSI-high -Increase Lantus from 25 to 30 units twice daily -Continue Lovenox 8 units AC -Continue statin  Hyperlipidemia: LDL 77. -Continue with Lipitor 20 mg daily.  Lethargy/possible delirium: Resolved. -Avoid sedating medications -Reorientation and delirium precautions  Anemia of chronic disease: H&H stable at baseline.  Anemia panel basically normal. -Continue monitoring  Leukocytosis: Likely demargination from steroid -Continue monitoring  Debility/physical deconditioning -PT/OT  Moderate malnutrition-as evidenced by low BMI and significant muscle mass and subcu fat loss. -Appreciate dietitian input  Body mass index is 20.66 kg/m. Nutrition Problem: Increased nutrient needs  Etiology: cancer and cancer related treatments (CHF, substance abuse) Signs/Symptoms: estimated needs Interventions: Ensure Enlive (each supplement provides 350kcal and 20 grams of protein)   DVT prophylaxis:  Place TED hose Start: 07/10/20 1054 SCD's Start: 07/08/20 0127    Code Status: Full code Family Communication: Patient and/or RN.  None at bedside.  Status is: Inpatient  Remains inpatient appropriate because:Hemodynamically unstable, Unsafe d/c plan and Inpatient level of care appropriate due to severity of illness   Dispo: The patient is from: Home              Anticipated d/c is to: Home              Anticipated d/c date is: 3 days once cleared by cardiology and radiation oncology              Patient currently is not medically stable to d/c.       Consultants:  Cardiology Oncology Neurology Radiation oncology   Sch Meds:  Scheduled Meds: . aspirin EC  81 mg Oral Daily  . atorvastatin  20 mg Oral Daily  . dexamethasone  4 mg Oral Q12H  . feeding supplement (ENSURE ENLIVE)  237 mL Oral BID BM  . insulin aspart  0-20 Units Subcutaneous TID WC  . insulin aspart  0-5 Units Subcutaneous QHS  . insulin aspart  8 Units Subcutaneous TID WC  . insulin glargine  25 Units Subcutaneous BID  . midodrine  10 mg Oral TID WC  . warfarin  3.75 mg Oral ONCE-1600  . Warfarin - Pharmacist Dosing Inpatient   Does not apply q1600   Continuous Infusions: PRN Meds:.acetaminophen **OR** acetaminophen (TYLENOL) oral liquid 160 mg/5 mL **OR** acetaminophen, ondansetron (ZOFRAN) IV, polyethylene glycol, senna-docusate  Antimicrobials: Anti-infectives (From admission, onward)   None       I have personally reviewed the following labs and images: CBC: Recent Labs  Lab 07/11/20 0509 07/12/20 0504 07/14/20 0626 07/16/20 0543  WBC 6.7 10.2  --  17.4*  NEUTROABS 5.5  --   --   --   HGB 9.3* 9.4* 9.7* 10.4*  HCT 31.0* 29.8* 31.9* 33.3*  MCV 95.1 93.4  --  94.9  PLT 352  384  --  422*   BMP &GFR Recent Labs  Lab 07/11/20 0509 07/16/20 0543  NA 137 134*  K 4.6 4.5  CL 103 100  CO2 25 25  GLUCOSE 97 305*  BUN 18 33*  CREATININE 0.95 0.90  CALCIUM 9.1 9.6  MG  --  2.0  PHOS  --  2.8   Estimated Creatinine Clearance: 87.4 mL/min (by C-G formula based on SCr of 0.9 mg/dL). Liver & Pancreas: Recent Labs  Lab 07/16/20 0543  ALBUMIN 2.9*   No results for input(s): LIPASE, AMYLASE in the last 168 hours. No results for input(s): AMMONIA in the last 168 hours. Diabetic: No results for input(s): HGBA1C in the last 72 hours. Recent Labs  Lab 07/16/20 1137 07/16/20 1612 07/16/20 2054 07/17/20 0735 07/17/20 1144  GLUCAP 201* 178* 196* 236* 219*   Cardiac Enzymes: No results for input(s): CKTOTAL, CKMB, CKMBINDEX, TROPONINI in the last 168 hours. No results for input(s): PROBNP in the last 8760 hours. Coagulation Profile: Recent Labs  Lab 07/13/20 0519 07/14/20 0626 07/15/20 0622 07/16/20 0543 07/17/20 0525  INR 2.1* 2.3* 2.6* 2.9* 2.8*   Thyroid Function Tests: No results for input(s): TSH, T4TOTAL, FREET4, T3FREE, THYROIDAB in the last 72 hours. Lipid Profile: No results for input(s): CHOL, HDL, LDLCALC, TRIG, CHOLHDL, LDLDIRECT in the last 72 hours. Anemia Panel: No results for input(s): VITAMINB12, FOLATE, FERRITIN, TIBC, IRON, RETICCTPCT in the last 72 hours. Urine analysis:    Component Value Date/Time   COLORURINE YELLOW 07/07/2020 1628   APPEARANCEUR CLEAR 07/07/2020 1628   LABSPEC 1.014 07/07/2020 1628   PHURINE 5.0 07/07/2020 1628   GLUCOSEU 50 (A) 07/07/2020 1628   HGBUR NEGATIVE 07/07/2020 1628   BILIRUBINUR NEGATIVE 07/07/2020 1628   KETONESUR NEGATIVE 07/07/2020 1628   PROTEINUR 100 (A) 07/07/2020 1628   UROBILINOGEN 1.0 07/16/2015 1715   NITRITE NEGATIVE 07/07/2020 1628   LEUKOCYTESUR NEGATIVE 07/07/2020 1628   Sepsis Labs: Invalid input(s): PROCALCITONIN, [place]  Microbiology: Recent Results (from the  past 240 hour(s))  Respiratory Panel by RT PCR (Flu A&B, Covid) - Nasopharyngeal Swab     Status: None   Collection Time: 07/07/20  9:15 PM   Specimen: Nasopharyngeal Swab  Result Value Ref Range Status   SARS Coronavirus 2 by RT PCR NEGATIVE NEGATIVE Final    Comment: (NOTE) SARS-CoV-2 target nucleic acids are NOT DETECTED.  The SARS-CoV-2 RNA is generally detectable in upper respiratoy specimens during the acute phase of infection. The lowest concentration of SARS-CoV-2 viral copies this assay can detect is 131 copies/mL. A negative result does not preclude SARS-Cov-2 infection and should not be used as the sole basis for treatment or other patient management decisions. A  negative result may occur with  improper specimen collection/handling, submission of specimen other than nasopharyngeal swab, presence of viral mutation(s) within the areas targeted by this assay, and inadequate number of viral copies (<131 copies/mL). A negative result must be combined with clinical observations, patient history, and epidemiological information. The expected result is Negative.  Fact Sheet for Patients:  PinkCheek.be  Fact Sheet for Healthcare Providers:  GravelBags.it  This test is no t yet approved or cleared by the [place] FDA and  has been authorized for detection and/or diagnosis of SARS-CoV-2 by FDA under an Emergency Use Authorization (EUA). This EUA will remain  in effect (meaning this test can be used) for the duration of the COVID-19 declaration under Section 564(b)(1) of the Act, 21 U.S.C. section 360bbb-3(b)(1), unless the authorization is terminated or revoked sooner.     Influenza A by PCR NEGATIVE NEGATIVE Final   Influenza B by PCR NEGATIVE NEGATIVE Final    Comment: (NOTE) The Xpert Xpress SARS-CoV-2/FLU/RSV assay is intended as an aid in  the diagnosis of influenza from Nasopharyngeal swab specimens and   should not be used as a sole basis for treatment. Nasal washings and  aspirates are unacceptable for Xpert Xpress SARS-CoV-2/FLU/RSV  testing.  Fact Sheet for Patients: PinkCheek.be  Fact Sheet for Healthcare Providers: GravelBags.it  This test is not yet approved or cleared by the [place] FDA and  has been authorized for detection and/or diagnosis of SARS-CoV-2 by  FDA under an Emergency Use Authorization (EUA). This EUA will remain  in effect (meaning this test can be used) for the duration of the  Covid-19 declaration under Section 564(b)(1) of the Act, 21  U.S.C. section 360bbb-3(b)(1), unless the authorization is  terminated or revoked. Performed at [facility], [place] [address] [place], [place] [address]     Radiology Studies: No results found.   Taye T. [place]  If 7PM-7AM, please contact night-coverage [email or web address] 07/17/2020, 2:30 PM

## 2020-07-17 NOTE — Progress Notes (Signed)
Occupational Therapy Progress Note  Patient agreeable to OOB activity. Patient min G for functional ambulation in room and standing sink side for g/h tasks due to mild unsteadiness and safety re: orthostatic hypotension. Patient report x1 feeling a little dizzy, chair placed behind patient however reports symptoms dissipated and able to complete without seated rest. BP semi-supine 131/99, BP EOB 95/75 and RN present at end of session once returned to recliner.     07/17/20 0900  OT Visit Information  Last OT Received On 07/17/20  Assistance Needed +1  History of Present Illness 59 yo male admitted with unsteady gait, slurred speech. Hx of CHF, DM, CVA, pacemaker, MDD, metastatic lung ca, polysubstance abuse  Precautions  Precautions Fall  Precaution Comments ABD binder and Thigh Highs for Orthostatic BP's  Pain Assessment  Pain Assessment No/denies pain  Cognition  Arousal/Alertness Awake/alert  Behavior During Therapy WFL for tasks assessed/performed  Overall Cognitive Status Within Functional Limits for tasks assessed  ADL  Overall ADL's  Needs assistance/impaired  Grooming Min guard;Standing;Wash/dry hands;Wash/dry face;Oral care  Grooming Details (indicate cue type and reason) performed oral care and face washing standing at sink with therapist providing min guard for safety.  Psychiatric nurse Details (indicate cue type and reason) min guard for transfer to recliner chair  Functional mobility during ADLs Min guard  General ADL Comments patient with report of brief dizziness when standing sink side, chair placed behind patient however did not need to sit and able to complete sink side g/h at min G  Bed Mobility  Overal bed mobility Modified Independent  Balance  Overall balance assessment Mild deficits observed, not formally tested  Transfers  Overall transfer level Needs assistance  Equipment used None  Transfers Sit to/from Stand  Sit to [employer]  guard  General transfer comment min guard for all mobility for safety secondary to orthostatic HTN.   General Comments  General comments (skin integrity, edema, etc.) BP semi-supine 131/99, seated EOB 95/75 and with RN at end of session once in recliner  OT - End of Session  Activity Tolerance Patient tolerated treatment well  Patient left in chair;with call bell/phone within reach;with chair alarm set;with nursing/sitter in room  Nurse Communication Mobility status  OT Assessment/Plan  OT Plan Discharge plan remains appropriate  OT Visit Diagnosis Unsteadiness on feet (R26.81);Other abnormalities of gait and mobility (R26.89);Muscle weakness (generalized) (M62.81);History of falling (Z91.81)  OT Frequency (ACUTE ONLY) Min 2X/week  Follow Up Recommendations Home health OT;Supervision/Assistance - 24 hour;SNF  OT Equipment 3 in 1 bedside commode  AM-PAC OT "6 Clicks" Daily Activity Outcome Measure (Version 2)  Help from another person eating meals? 3  Help from another person taking care of personal grooming? 3  Help from another person toileting, which includes using toliet, bedpan, or urinal? 3  Help from another person bathing (including washing, rinsing, drying)? 3  Help from another person to put on and taking off regular upper body clothing? 3  Help from another person to put on and taking off regular lower body clothing? 3  6 Click Score 18  OT Goal Progression  Progress towards OT goals Progressing toward goals  Acute Rehab OT Goals  Patient Stated Goal get well  OT Goal Formulation With patient  Time For Goal Achievement 07/24/20  Potential to Achieve Goals Good  OT Time Calculation  OT Start Time (ACUTE ONLY) 0807  OT Stop Time (ACUTE ONLY) 3419  OT Time Calculation (min) 19 min  OT General Charges  $OT Visit 1 Visit  OT Treatments  $Self Care/Home Management  8-22 mins   Delbert Phenix OT OT pager: [phone]

## 2020-07-17 NOTE — Progress Notes (Signed)
Progress Note  Patient Name: Dennis [name]. Date of Encounter: 07/17/2020  [facility] Cardiologist: Glori Bickers, MD   Subjective   No CV complaints. No dizziness. BP normal range.  Unclear whether his brain CT abnormality is truly a metastasis. Rad Onc strongly wants an MRI for best differential diagnosis.  Inpatient Medications    Scheduled Meds: . aspirin EC  81 mg Oral Daily  . atorvastatin  20 mg Oral Daily  . dexamethasone  4 mg Oral Q12H  . feeding supplement (ENSURE ENLIVE)  237 mL Oral BID BM  . insulin aspart  0-20 Units Subcutaneous TID WC  . insulin aspart  0-5 Units Subcutaneous QHS  . insulin aspart  8 Units Subcutaneous TID WC  . insulin glargine  25 Units Subcutaneous BID  . midodrine  10 mg Oral TID WC  . warfarin  3.75 mg Oral ONCE-1600  . Warfarin - Pharmacist Dosing Inpatient   Does not apply q1600   Continuous Infusions:  PRN Meds: acetaminophen **OR** acetaminophen (TYLENOL) oral liquid 160 mg/5 mL **OR** acetaminophen, ondansetron (ZOFRAN) IV, polyethylene glycol, senna-docusate   Vital Signs    Vitals:   07/16/20 1321 07/16/20 2100 07/17/20 0454 07/17/20 0532  BP: (!) 146/92 (!) 163/103 119/87   Pulse: 84 81 77   Resp: 16 19 18    Temp: 97.8 F (36.6 C) 97.8 F (36.6 C) (!) 97.5 F (36.4 C)   TempSrc: Oral Oral Oral   SpO2: 100% 100% 100%   Weight:    69.1 kg  Height:        Intake/Output Summary (Last 24 hours) at 07/17/2020 1146 Last data filed at 07/17/2020 0135 Gross per 24 hour  Intake 600 ml  Output 725 ml  Net -125 ml   Last 3 Weights 07/17/2020 07/16/2020 07/08/2020  Weight (lbs) 152 lb 5.4 oz 147 lb 11.3 oz 137 lb  Weight (kg) 69.1 kg 67 kg 62.143 kg  Some encounter information is confidential and restricted. Go to Review Flowsheets activity to see all data.      Telemetry    NSR, occ PVCs - Personally Reviewed  ECG    No new tracing - Personally Reviewed  Physical Exam  Appears well GEN: No acute  distress.   Neck: No JVD Cardiac: RRR, no murmurs, rubs, or gallops.  Respiratory: Clear to auscultation bilaterally. GI: Soft, nontender, non-distended  MS: No edema; No deformity. Neuro:  Nonfocal  Psych: Normal affect   Labs    High Sensitivity Troponin:  No results for input(s): TROPONINIHS in the last 720 hours.    Chemistry Recent Labs  Lab 07/11/20 0509 07/16/20 0543  NA 137 134*  K 4.6 4.5  CL 103 100  CO2 25 25  GLUCOSE 97 305*  BUN 18 33*  CREATININE 0.95 0.90  CALCIUM 9.1 9.6  ALBUMIN  --  2.9*  GFRNONAA >60 >60  GFRAA >60 >60  ANIONGAP 9 9     Hematology Recent Labs  Lab 07/11/20 0509 07/11/20 0509 07/12/20 0504 07/14/20 0626 07/16/20 0543  WBC 6.7  --  10.2  --  17.4*  RBC 3.26*  --  3.19*  --  3.51*  HGB 9.3*   < > 9.4* 9.7* 10.4*  HCT 31.0*   < > 29.8* 31.9* 33.3*  MCV 95.1  --  93.4  --  94.9  MCH 28.5  --  29.5  --  29.6  MCHC 30.0  --  31.5  --  31.2  RDW 13.6  --  13.5  --  14.6  PLT 352  --  384  --  422*   < > = values in this interval not displayed.    BNPNo results for input(s): BNP, PROBNP in the last 168 hours.   DDimer No results for input(s): DDIMER in the last 168 hours.   Radiology    No results found.  Cardiac Studies   2D echo 07/09/20  1. Since the last exam on 12/25/2019 LVEF has slightly improved from  15-20% to 25-30% with diffuse hypokinesis.  2. Left ventricular ejection fraction, by estimation, is 25 to 30%. The  left ventricle has severely decreased function. The left ventricle  demonstrates global hypokinesis. The left ventricular internal cavity size  was moderately dilated. There is mild  concentric left ventricular hypertrophy. Left ventricular diastolic  parameters are consistent with Grade II diastolic dysfunction  (pseudonormalization). Elevated left atrial pressure.  3. Right ventricular systolic function is normal. The right ventricular  size is normal. There is normal pulmonary artery systolic  pressure.  4. A small pericardial effusion is present. The pericardial effusion is  posterior to the left ventricle.  5. The mitral valve is normal in structure. Mild mitral valve  regurgitation. No evidence of mitral stenosis.  6. The aortic valve is normal in structure. Aortic valve regurgitation is  trivial. No aortic stenosis is present.  7. The inferior vena cava is normal in size with greater than 50%  respiratory variability, suggesting right atrial pressure of 3 mmHg.    Patient Profile     59 y.o. male with chronic combined systolic/diastolic HF due to mixed ischemic/ nonischemic CMP (CAD with CTO of LAD by cath 12/2019, negative stress test 09/2018;ventricular non-compaction) s/p St. Jude dual chamber ICD, prior stroke in 2016, on warfarin, HLD, uncontrolled DM2, polysubstance abuse (tobacco, cocaine, alcohol), small cell lung CA with brain mets (dx 12/2019) s/p whole brain radiation and undergoing chemotherapy. Admitted with slurred speech, LUE parasthesia, unsteady gait. CT head with and without contrast did not reveal CVA but possible pontine and medullary infiltrating lesion concerning for metastatic disease. EEG without seizure activity. UDS positive for cocaine.  Recurrent syncope due to orthostatic hypotension (no arrhythmia on ICD or telemetry) ICD St. Jude Fortify implanted 2011, not MRI-conditional, anticipated ERI 3.8 months.  Assessment & Plan   1. CHF: appears compensated, probably euvolemic, all HF meds stopped due to orthostatic hypotension with syncope. 2. CAD: no angina. Not on ASA due to full anticoagulation. 3. ICD:  His device is not officially "MRI-conditional", but he does not require pacing, has not received therapy for VT/VF and does not have abandoned leads. It appears that MRI is critical for his care. We can turn off tachycardia detection and therapies so that he can have MRI. Although not"FDA-approved", he can have an MRI scan with standard ICD  precautions if Radiology agrees. Device is close to ERI. Consider permanently deactivating tachy therapies and not performing a generator chngeout in view of limited prognosis from lung Ca.     For questions or updates, please contact [place] Please consult [email or web address] for contact info under        Signed, Sanda Klein, MD  07/17/2020, 11:46 AM

## 2020-07-18 ENCOUNTER — Other Ambulatory Visit: Payer: Medicare Other

## 2020-07-18 ENCOUNTER — Ambulatory Visit: Payer: Medicare Other

## 2020-07-18 ENCOUNTER — Ambulatory Visit: Payer: Medicare Other | Admitting: Internal Medicine

## 2020-07-18 DIAGNOSIS — R2681 Unsteadiness on feet: Secondary | ICD-10-CM | POA: Diagnosis not present

## 2020-07-18 LAB — GLUCOSE, CAPILLARY
Glucose-Capillary: 227 mg/dL — ABNORMAL HIGH (ref 70–99)
Glucose-Capillary: 252 mg/dL — ABNORMAL HIGH (ref 70–99)
Glucose-Capillary: 124 mg/dL — ABNORMAL HIGH (ref 70–99)
Glucose-Capillary: 157 mg/dL — ABNORMAL HIGH (ref 70–99)

## 2020-07-18 LAB — BASIC METABOLIC PANEL
Anion gap: 8 (ref 5–15)
BUN: 34 mg/dL — ABNORMAL HIGH (ref 6–20)
CO2: 27 mmol/L (ref 22–32)
Calcium: 9.5 mg/dL (ref 8.9–10.3)
Chloride: 102 mmol/L (ref 98–111)
Creatinine, Ser: 0.96 mg/dL (ref 0.61–1.24)
GFR calc non Af Amer: >60 mL/min (ref >60)
Glucose, Bld: 149 mg/dL — ABNORMAL HIGH (ref 70–99)
Potassium: 4.4 mmol/L (ref 3.5–5.1)
Sodium: 137 mmol/L (ref 135–145)

## 2020-07-18 LAB — CBC
HCT: 33 % — ABNORMAL LOW (ref 39.0–52.0)
Hemoglobin: 10.4 g/dL — ABNORMAL LOW (ref 13.0–17.0)
MCH: 30.1 pg (ref 26.0–34.0)
MCHC: 31.5 g/dL (ref 30.0–36.0)
MCV: 95.7 fL (ref 80.0–100.0)
Platelets: 387 10*3/uL (ref 150–400)
RBC: 3.45 MIL/uL — ABNORMAL LOW (ref 4.22–5.81)
RDW: 15 % (ref 11.5–15.5)
WBC: 15 10*3/uL — ABNORMAL HIGH (ref 4.0–10.5)
nRBC: 0 % (ref 0.0–0.2)

## 2020-07-18 LAB — PROTIME-INR
INR: 2.7 — ABNORMAL HIGH (ref 0.8–1.2)
Prothrombin Time: 28.1 seconds — ABNORMAL HIGH (ref 11.4–15.2)

## 2020-07-18 MED ORDER — WARFARIN SODIUM 2.5 MG PO TABS
3.7500 mg | ORAL_TABLET | Freq: Once | ORAL | Status: AC
  Administered 2020-07-18: 3.75 mg via ORAL
  Filled 2020-07-18: qty 1

## 2020-07-18 MED ORDER — INSULIN GLARGINE 100 UNIT/ML ~~LOC~~ SOLN
28.0000 [IU] | Freq: Two times a day (BID) | SUBCUTANEOUS | Status: DC
  Administered 2020-07-18 – 2020-07-22 (×8): 28 [IU] via SUBCUTANEOUS
  Filled 2020-07-18 (×8): qty 0.28

## 2020-07-18 NOTE — Progress Notes (Signed)
PROGRESS NOTE    Dennis [name].  XNA:[id number] DOB: 1961-05-08 DOA: 07/07/2020 PCP: [name], MD   Chief Complaint  Patient presents with  . 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  CT head without contrast without acute finding.  Neurology consulted.  Could not do MRI due to ICD.  Repeat CT head with contrast did not reveal CVA but concern about infiltrating enhancement in ventral pons and proximal medulla concerning of metastatic disease.  EEG without epileptiform or seizure activity.  Patient with significant orthostatic hypotension even after holding cardiac meds, starting midodrine, TED hose and abdominal binder.  Initially there was some vasovagal element to his orthostasis but vasovagal issues seem to have resolved.  Echo with EF of 25 to 30% (previously 15 to 20%), diffuse hypokinesis G2-DD.  Cardiology following.  Radiation oncology consulted by oncology, and hoping to do MRI brain for better info about infiltrating enhancement in midbrain. However, patient has AICD which is " MRI conditional" and " not FDA approved". Cardiology willing to turn off AICD if radiology is willing to do MRI brain. If note, radiation oncology would like to get special CT with thin cuts.   Subjective:  Seen this morning.  Alert awake somewhat orthostatic yesterday but seems to have improved from 54-27 systolic previously. No new complaints  Assessment & Plan:  Slurred speech/LUE paresthesia/unsteady gait in patient with prior right MCA CVA-CT head with and without contrast did not reveal CVA but possible pontine and medullary infiltrating lesion concerning for metastatic disease.  Unfortunately, his ICD is incompatible with MRI.  Echo EF  of 25 to 30% (improved). EEG no seizure activity, UDS positive for cocaine.  INR therapeutic now but was subtherapeutic on admission, HBA1c 10.5%.  LDL 77.  Neurologically intact.  Continue with aspirin, warfarin, PT OT  Chronic systolic CHF status post AICD March/2021 euvolemic, monitored by cardiology.  Continue his home meds which are changed due to orthostatic hypotension Entresto Coreg Aldactone on hold  Recurrent syncope/orthostatic hypotension: Appears to be improving initially concern for vasovagal. He reports some hesitancy and sense of incomplete void for months.  Never seen urology.  Also concern about autonomic dysfunction in the setting of his brain mets.  TTE as above.  Cortisol low which is expected while on dexamethasone.  TSH normal. -Cardiology following-cardiac meds on hold, on midodrine10 mg 3 times daily, TED hose and abdominal binder.  Continue to work with PT OTwho are monitor OV.  CAD history occluded LAD with collaterals no anginal symptoms on aspirin and statin  Small cell lung cancer with brain mets status post whole brain radiation:CT head with and without contrast concerning for pontine and medullary infiltrates so on Decadron 4 mg every 12 hours per oncology -Per Dr. Mickeal Skinner with rad onc, MRI brain or CT scan with thin cuts. Cards to turn off AICD if radiology willing to do MRI. Await further inputs.  HLD: Continue statin  Cocaine abuse: Cessation advised.  Essential hypertension: Orthostatic . So meds on hold.  Monitor.  Continue midodrine  Uncontrolled type 2 diabetes mellitus with hyperglycemia, with long-term current use of insulin. hba1c 10.5, sugar uncontrolled increase Lantus to 28 units twice daily continue premeal insulin 8 units  Lethargy/possible delirium-seems to have resolved.  Reports sedatives.  Leukocytosis likely from steroid monitor  Debility/deconditioning/failure to thrive-expecting admission. Will benefit with palliative care consultation   Anemia of chronic disease monitor hemoglobin anemia panel basically stable  Nutrition: Diet Order            Diet Carb Modified Fluid consistency: Thin; Room service appropriate? Yes with Assist  Diet effective now                 Nutrition Problem: Increased nutrient needs Etiology: cancer and cancer related treatments (CHF, substance abuse) Signs/Symptoms: estimated needs Interventions: Ensure Enlive (each supplement provides 350kcal and 20 grams of protein)  Body mass index is 19.6 kg/m.  DVT prophylaxis: Place TED hose Start: 07/10/20 1054 SCD's Start: 07/08/20 0127 Code Status:   Code Status: Full Code  Family Communication: plan of care discussed with patient at bedside.  Status is: Inpatient Remains inpatient appropriate because:Inpatient level of care appropriate due to severity of illness  Dispo: The patient is from: Home              Anticipated d/c is to: Home              Anticipated d/c date is: 3 days              Patient currently is not medically stable to d/c. Consultants:see note  Procedures:see note  Culture/Microbiology    Component Value Date/Time   SDES URINE, CLEAN CATCH 01/30/2015 1650   SPECREQUEST NONE 01/30/2015 1650   CULT NO GROWTH Performed at [facility]  01/30/2015 1650   REPTSTATUS 02/01/2015 FINAL 01/30/2015 1650    Other culture-see note  Medications: Scheduled Meds: . aspirin EC  81 mg Oral Daily  . atorvastatin  20 mg Oral Daily  . dexamethasone  4 mg Oral Q12H  . feeding supplement (ENSURE ENLIVE)  237 mL Oral BID BM  . insulin aspart  0-20 Units Subcutaneous TID WC  . insulin aspart  0-5 Units Subcutaneous QHS  . insulin aspart  8 Units Subcutaneous TID WC  . insulin glargine  28 Units Subcutaneous BID  . midodrine  10 mg Oral TID WC  . warfarin  3.75 mg Oral ONCE-1600  . Warfarin - Pharmacist Dosing Inpatient   Does not apply q1600   Continuous Infusions:  Antimicrobials: Anti-infectives (From admission,  onward)   None     Objective: Vitals: Today's Vitals   07/18/20 0512 07/18/20 0837 07/18/20 1203 07/18/20 1512  BP:   118/90 (!) 143/93  Pulse:   96 90  Resp:   19 (!) 21  Temp:   98.4 F (36.9 C) 98.5 F (36.9 C)  TempSrc:    Oral  SpO2: 92%  99% 96%  Weight:      Height:      PainSc:  0-No pain      Intake/Output Summary (Last 24 hours) at 07/18/2020 1550 Last data filed at 07/18/2020 1100 Gross per 24 hour  Intake 480 ml  Output 1276 ml  Net -796 ml   Filed Weights   07/16/20 1225 07/17/20 0532 07/18/20 0503  Weight: 67 kg 69.1 kg 65.5 kg   Weight change: -1.455 kg  Intake/Output from previous day: 10/05 0701 - 10/06 0700 In: 480 [P.O.:480] Out: 1275 [Urine:1275] Intake/Output this shift: Total I/O In: 480 [P.O.:480] Out: 1 [Stool:1]  Examination: General exam: AAO, ill frail old for his age,NAD, weak appearing. HEENT:Oral mucosa moist, Ear/Nose WNL grossly,dentition normal. Respiratory system: bilaterally clear,no wheezing or crackles,no use of accessory  muscle, non tender. Cardiovascular system: S1 & S2 +, regular, No JVD. Gastrointestinal system: Abdomen soft, NT,ND, BS+. Nervous System:Alert, awake, moving extremities and grossly nonfocal Extremities: No edema, distal peripheral pulses palpable.  Skin: No rashes,no icterus. MSK: Normal muscle bulk,tone, power  Data Reviewed: I have personally reviewed labs and imaging studies CBC: Recent Labs  Lab 07/12/20 0504 07/14/20 0626 07/16/20 0543 07/18/20 0435  WBC 10.2  --  17.4* 15.0*  HGB 9.4* 9.7* 10.4* 10.4*  HCT 29.8* 31.9* 33.3* 33.0*  MCV 93.4  --  94.9 95.7  PLT 384  --  422* 001   Basic Metabolic Panel: Recent Labs  Lab 07/16/20 0543 07/18/20 0435  NA 134* 137  K 4.5 4.4  CL 100 102  CO2 25 27  GLUCOSE 305* 149*  BUN 33* 34*  CREATININE 0.90 0.96  CALCIUM 9.6 9.5  MG 2.0  --   PHOS 2.8  --    GFR: Estimated Creatinine Clearance: 77.7 mL/min (by C-G formula based on SCr of 0.96  mg/dL). Liver Function Tests: Recent Labs  Lab 07/16/20 0543  ALBUMIN 2.9*   No results for input(s): LIPASE, AMYLASE in the last 168 hours. No results for input(s): AMMONIA in the last 168 hours. Coagulation Profile: Recent Labs  Lab 07/14/20 0626 07/15/20 0622 07/16/20 0543 07/17/20 0525 07/18/20 0435  INR 2.3* 2.6* 2.9* 2.8* 2.7*   Cardiac Enzymes: No results for input(s): CKTOTAL, CKMB, CKMBINDEX, TROPONINI in the last 168 hours. BNP (last 3 results) No results for input(s): PROBNP in the last 8760 hours. HbA1C: No results for input(s): HGBA1C in the last 72 hours. CBG: Recent Labs  Lab 07/17/20 1144 07/17/20 1650 07/17/20 2047 07/18/20 0734 07/18/20 1134  GLUCAP 219* 218* 168* 227* 252*   Lipid Profile: No results for input(s): CHOL, HDL, LDLCALC, TRIG, CHOLHDL, LDLDIRECT in the last 72 hours. Thyroid Function Tests: No results for input(s): TSH, T4TOTAL, FREET4, T3FREE, THYROIDAB in the last 72 hours. Anemia Panel: No results for input(s): VITAMINB12, FOLATE, FERRITIN, TIBC, IRON, RETICCTPCT in the last 72 hours. Sepsis Labs: No results for input(s): PROCALCITON, LATICACIDVEN in the last 168 hours.  No results found for this or any previous visit (from the past 240 hour(s)).   Radiology Studies: No results found.   LOS: 9 days   Antonieta Pert, MD [facility]  07/18/2020, 3:50 PM  In conjunction.

## 2020-07-18 NOTE — Progress Notes (Signed)
Results for ASIEL, CHROSTOWSKI (MRN [id number]) as of 07/18/2020 13:09  Ref. Range 07/17/2020 11:44 07/17/2020 16:50 07/17/2020 20:47 07/18/2020 07:34 07/18/2020 11:34  Glucose-Capillary Latest Ref Range: 70 - 99 mg/dL 219 (H) 218 (H) 168 (H) 227 (H) 252 (H)  Noted that blood sugars have been greater than 180 mg/dl.   Recommend increasing Lantus to 28 units BID if blood sugars continue to be elevated. Titrate dosages as needed.   Will continue to monitor blood sugars while in the hospital.   Harvel Ricks RN BSN CDE Diabetes Coordinator Pager: [phone]  8am-5pm

## 2020-07-18 NOTE — Progress Notes (Signed)
ANTICOAGULATION CONSULT NOTE  Pharmacy Consult for Warfarin Indication: advanced (noncompaction) cardiomyopathy  No Known Allergies  Patient Measurements: Height: 6' (182.9 cm) Weight: 65.5 kg (144 lb 8 oz) IBW/kg (Calculated) : 77.6  Vital Signs: Temp: 98.4 F (36.9 C) (10/06 1203) Temp Source: Oral (10/06 0503) BP: 118/90 (10/06 1203) Pulse Rate: 96 (10/06 1203)  Labs: Recent Labs    07/16/20 0543 07/17/20 0525 07/18/20 0435  HGB 10.4*  --  10.4*  HCT 33.3*  --  33.0*  PLT 422*  --  387  LABPROT 29.3* 28.8* 28.1*  INR 2.9* 2.8* 2.7*  CREATININE 0.90  --  0.96    Estimated Creatinine Clearance: 77.7 mL/min (by C-G formula based on SCr of 0.96 mg/dL).  Medications:  Scheduled:  . aspirin EC  81 mg Oral Daily  . atorvastatin  20 mg Oral Daily  . dexamethasone  4 mg Oral Q12H  . feeding supplement (ENSURE ENLIVE)  237 mL Oral BID BM  . insulin aspart  0-20 Units Subcutaneous TID WC  . insulin aspart  0-5 Units Subcutaneous QHS  . insulin aspart  8 Units Subcutaneous TID WC  . insulin glargine  25 Units Subcutaneous BID  . midodrine  10 mg Oral TID WC  . 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 Per med rec and chart records on visit on 9/3, pt is on Warfarin 7.5 mg On Tues/Thur/Sat and 3.75 mg on all other days. Baseline INR subtherapeutic on admission; possible noncompliance.  Today, 07/18/20   INR now therapeutic, stable at 2.7  Hgb yesterday low but improved after PRBC, now 10.4 ; Plt stable WNL  CT head negative for hemorrhage  No major drug-drug interactions with warfarin; continues on concomitant ASA from PTA  Goal of Therapy:  INR 2-3 Monitor platelets by anticoagulation protocol: Yes   Plan:   Will try and restart patient's home regimen beginning today - 3.75mg  per above regimen  Daily INR    For discharge, recommend resuming PTA dosing with INR check in 3-5 days  Monitor for signs and symptoms of bleeding    Adrian Saran, PharmD, BCPS [phone] until 3pm 07/18/2020 1:14 PM

## 2020-07-18 NOTE — Progress Notes (Addendum)
Progress Note  Patient Name: Dennis [name]. Date of Encounter: 07/18/2020  Primary Cardiologist: Glori Bickers, MD  Subjective   No cardiovascular complaints. No further dizziness upon standing. Orthostatic VS reviewed yesterday in OT note - BP 131/99 semi-supine then 95/75 EOB (improved from prior more dramatic falls into the 51V-61Y systolic). Awaiting orthostatics today, typically obtained with PT/OT. No edema, orthopnea, chest pain.  Inpatient Medications    Scheduled Meds: . aspirin EC  81 mg Oral Daily  . atorvastatin  20 mg Oral Daily  . dexamethasone  4 mg Oral Q12H  . feeding supplement (ENSURE ENLIVE)  237 mL Oral BID BM  . insulin aspart  0-20 Units Subcutaneous TID WC  . insulin aspart  0-5 Units Subcutaneous QHS  . insulin aspart  8 Units Subcutaneous TID WC  . insulin glargine  25 Units Subcutaneous BID  . midodrine  10 mg Oral TID WC  . Warfarin - Pharmacist Dosing Inpatient   Does not apply q1600   Continuous Infusions:  PRN Meds: acetaminophen **OR** acetaminophen (TYLENOL) oral liquid 160 mg/5 mL **OR** acetaminophen, ondansetron (ZOFRAN) IV, polyethylene glycol, senna-docusate   Vital Signs    Vitals:   07/17/20 1600 07/17/20 2034 07/18/20 0503 07/18/20 0512  BP: 110/70 127/83 122/88   Pulse: 84 89 80   Resp:  18    Temp:  98.2 F (36.8 C) 98 F (36.7 C)   TempSrc:  Oral Oral   SpO2:  99% 100% 92%  Weight:   65.5 kg   Height:        Intake/Output Summary (Last 24 hours) at 07/18/2020 0931 Last data filed at 07/18/2020 0758 Gross per 24 hour  Intake 240 ml  Output 1276 ml  Net -1036 ml   Last 3 Weights 07/18/2020 07/17/2020 07/16/2020  Weight (lbs) 144 lb 8 oz 152 lb 5.4 oz 147 lb 11.3 oz  Weight (kg) 65.545 kg 69.1 kg 67 kg  Some encounter information is confidential and restricted. Go to Review Flowsheets activity to see all data.     Telemetry    NSR with rare PVC - Personally Reviewed  Physical Exam   GEN: No acute distress,  thin, cachectic appearing HEENT: Normocephalic, atraumatic, sclera non-icteric. Neck: No JVD or bruits. Cardiac: RRR no murmurs, rubs, or gallops.  Radials/DP/PT 1+ and equal bilaterally.  Respiratory: Clear to auscultation bilaterally. Breathing is unlabored. GI: Soft, nontender, non-distended, BS +x 4. MS: no deformity. Extremities: No clubbing or cyanosis. No edema. Distal pedal pulses are 2+ and equal bilaterally. Neuro:  AAOx3. Follows commands. Psych:  Responds to questions appropriately with a normal affect.  Labs    High Sensitivity Troponin:  No results for input(s): TROPONINIHS in the last 720 hours.    Cardiac EnzymesNo results for input(s): TROPONINI in the last 168 hours. No results for input(s): TROPIPOC in the last 168 hours.   Chemistry Recent Labs  Lab 07/16/20 0543 07/18/20 0435  NA 134* 137  K 4.5 4.4  CL 100 102  CO2 25 27  GLUCOSE 305* 149*  BUN 33* 34*  CREATININE 0.90 0.96  CALCIUM 9.6 9.5  ALBUMIN 2.9*  --   GFRNONAA >60 >60  GFRAA >60  --   ANIONGAP 9 8     Hematology Recent Labs  Lab 07/12/20 0504 07/12/20 0504 07/14/20 0626 07/16/20 0543 07/18/20 0435  WBC 10.2  --   --  17.4* 15.0*  RBC 3.19*  --   --  3.51* 3.45*  HGB 9.4*   < >  9.7* 10.4* 10.4*  HCT 29.8*   < > 31.9* 33.3* 33.0*  MCV 93.4  --   --  94.9 95.7  MCH 29.5  --   --  29.6 30.1  MCHC 31.5  --   --  31.2 31.5  RDW 13.5  --   --  14.6 15.0  PLT 384  --   --  422* 387   < > = values in this interval not displayed.    BNPNo results for input(s): BNP, PROBNP in the last 168 hours.   DDimer No results for input(s): DDIMER in the last 168 hours.   Radiology    No results found.  Cardiac Studies   2D echo 07/09/20  1. Since the last exam on 12/25/2019 LVEF has slightly improved from  15-20% to 25-30% with diffuse hypokinesis.  2. Left ventricular ejection fraction, by estimation, is 25 to 30%. The  left ventricle has severely decreased function. The left ventricle   demonstrates global hypokinesis. The left ventricular internal cavity size  was moderately dilated. There is mild  concentric left ventricular hypertrophy. Left ventricular diastolic  parameters are consistent with Grade II diastolic dysfunction  (pseudonormalization). Elevated left atrial pressure.  3. Right ventricular systolic function is normal. The right ventricular  size is normal. There is normal pulmonary artery systolic pressure.  4. A small pericardial effusion is present. The pericardial effusion is  posterior to the left ventricle.  5. The mitral valve is normal in structure. Mild mitral valve  regurgitation. No evidence of mitral stenosis.  6. The aortic valve is normal in structure. Aortic valve regurgitation is  trivial. No aortic stenosis is present.  7. The inferior vena cava is normal in size with greater than 50%  respiratory variability, suggesting right atrial pressure of 3 mmHg.   Patient Profile     59 y.o. 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 dual chamber ICD 2011, CAD with totally occluded LAD with collaterals by cath 12/2019 (negative stress test 09/2018), prior stroke in 2016, HLD, DM2, polysubstance abuse (tobacco, cocaine, alcohol), small cell lung CA with brain mets (dx 12/2019) s/p whole brain radiation and undergoing chemotherapy, and noncompliance who was admitted with slurred speech, LUE parasthesia, unsteady gait. CT head with and without contrast did not reveal CVA but possible pontine and medullary infiltrating lesion concerning for metastatic disease. MRI not pursued due to ICD.EEG without seizure activity. UDS was + for cocaine. A1c 10.5%. During admission he was also noted to have episodes of syncope while voiding without corresponding arrhythmias on telemetry, also reported syncope at home, likely due to severe orthostasis. ICD interrogation without any atrial or  ventricular arrhythmias, device not scheduled to record anything less than 214bpm, anticipated ERI 3.8 months.   Assessment & Plan     1. Slurred speech/LUE parasthesia/unsteady gait - CT head with contrast showed infiltrating enhancement in the ventral pons concerning for metastatic disease, no hemorrhage.  MRI was suggested but initially deferred due to ICD. Per notes yesterday, rad onc strongly wants an MRI for best differential diagnosis. Dr. Sallyanne Kuster stated, "His device is not officially "MRI-conditional", but he does not require pacing, has not received therapy for VT/VF and does not have abandoned leads. It appears that MRI is critical for his care. We can turn off tachycardia detection and therapies so that he can have MRI. Although not"FDA-approved", he can have an MRI scan with standard ICD precautions  if Radiology agrees. Device is close to ERI. Consider permanently deactivating tachy therapies and not performing a generator chngeout in view of limited prognosis from lung CA" - Oncology evaluated patient earlier this admission and started decadron   2. Recurrent syncope, suspected due to severe orthostasis - not associated with arrhythmias on telemetry - now on midodrine, TED hose, abdominal binder with improvement in BP parameters - still with orthostatic drop but no longer symptomatic - question whether component of autonomic neuropathy from DM plays a role, also question relationship to brain mets  3. Chronic systolic CHF with ventricular noncompaction - on warfarin per pharmacy - guideline directed medical therapy on hold due to severe orthostasis this admission (Entresto, carvedilol, spironolactone) - repeat echo 07/09/20 with EF 25-30%, slight improvement from prior, grade 2 DD, small pericardial effusion, mild MR - volume status looks OK  4. CAD with occluded LAD - no angina, continues on ASA/statin at this time - MD to review plan for ASA given concomitant warfarin and brain  mets (last note indicates no ASA)  5. Polysubstance abuse with recurrent cocaine use - counseled on importance of cessation, any recurrent use will be very dangerous for him given comorbidities   For questions or updates, please contact [place] Please consult [email or web address] for contact info under Cardiology/STEMI.  Signed, Charlie Pitter, PA-C 07/18/2020, 9:31 AM    History and all data above reviewed.  Patient examined.  I agree with the findings as above.  He denies any new SOB or pain.  Was able to get up and move in the room slightly.  No acute SOB.  No pain.  The patient exam reveals COR:RRR  ,  Lungs: Clear  ,  Abd: Positive bowel sounds, no rebound no guarding, Ext No edema  .  All available labs, radiology testing, previous records reviewed. Agree with documented assessment and plan.  Agree that therapy is limited.  I discussed conservative precautions to avoid symptomatic hypotension.  There are also thigh compression wraps that could be employed if symptoms persist.  No further suggestions at this time.   Sailor Hevia  1:28 PM  07/18/2020

## 2020-07-19 DIAGNOSIS — F1490 Cocaine use, unspecified, uncomplicated: Secondary | ICD-10-CM | POA: Diagnosis not present

## 2020-07-19 DIAGNOSIS — R2681 Unsteadiness on feet: Secondary | ICD-10-CM | POA: Diagnosis not present

## 2020-07-19 DIAGNOSIS — C3490 Malignant neoplasm of unspecified part of unspecified bronchus or lung: Secondary | ICD-10-CM | POA: Diagnosis not present

## 2020-07-19 DIAGNOSIS — Z5181 Encounter for therapeutic drug level monitoring: Secondary | ICD-10-CM | POA: Diagnosis not present

## 2020-07-19 DIAGNOSIS — R471 Dysarthria and anarthria: Secondary | ICD-10-CM | POA: Diagnosis not present

## 2020-07-19 LAB — GLUCOSE, CAPILLARY
Glucose-Capillary: 166 mg/dL — ABNORMAL HIGH (ref 70–99)
Glucose-Capillary: 181 mg/dL — ABNORMAL HIGH (ref 70–99)
Glucose-Capillary: 140 mg/dL — ABNORMAL HIGH (ref 70–99)
Glucose-Capillary: 171 mg/dL — ABNORMAL HIGH (ref 70–99)
Glucose-Capillary: 93 mg/dL (ref 70–99)

## 2020-07-19 LAB — PROTIME-INR
INR: 2.7 — ABNORMAL HIGH (ref 0.8–1.2)
Prothrombin Time: 27.8 seconds — ABNORMAL HIGH (ref 11.4–15.2)

## 2020-07-19 MED ORDER — WARFARIN SODIUM 5 MG PO TABS
5.0000 mg | ORAL_TABLET | Freq: Once | ORAL | Status: AC
  Administered 2020-07-19: 5 mg via ORAL
  Filled 2020-07-19: qty 1

## 2020-07-19 NOTE — Progress Notes (Signed)
SLP Cancellation Note  Patient Details Name: Dennis [name]. MRN: [id number] DOB: 08/08/1961   Cancelled treatment:       Reason Eval/Treat Not Completed:  (pt with cardiology MD at time of SLP attempt to visit for tx, Will follow up next week)  Dennis Lime, MS [facility] SLP Acute Rehab Services Office [phone]  Dennis [name] 07/19/2020, 4:02 PM

## 2020-07-19 NOTE — Progress Notes (Signed)
It is my understanding that a brain MRI is critical and indispensable for this patient's care, according his Radiation Oncologist. Although he has an ICD generator that is an older model and not certified as "MRI-conditional", his ICD system is otherwise acceptable for MRI (active fixation leads ventricular Durata and atrial 2088, normal lead parameters, no abandoned leads). He is not pacemaker dependent. He has never received therapy for VT/VF from his device. In my opinion, an MRI of the brain can be performed with low risk, following conventional precautions for MRI-conditional devices (1.5 T MRI preferred, gradient slew rate < 200 T/m/s, continuous patient monitoring with electrocardiography/pulse oximetry). We can program the device ODO (pacing off) and VT/VF detection and therapies off. I have appraised the local [employer] reps of our intentions to temporarily reprogram his device in this manner for the MRI. I have also discussed the fact that this is an "off-label" arrangement for this type of ICD with the patient. He is eager to proceed with the MRI, which has major implications for his treatment and prognosis.  Sanda Klein, MD, [facility] [phone] office [phone] pager

## 2020-07-19 NOTE — Progress Notes (Signed)
PROGRESS NOTE    Dennis [name].  WNU:[id number] DOB: 1961/05/01 DOA: 07/07/2020 PCP: [name], MD   Chief Complaint  Patient presents with  . 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  CT head without contrast without acute finding.  Neurology consulted.  Could not do MRI due to ICD.  Repeat CT head with contrast did not reveal CVA but concern about infiltrating enhancement in ventral pons and proximal medulla concerning of metastatic disease.  EEG without epileptiform or seizure activity.  Patient with significant orthostatic hypotension even after holding cardiac meds, starting midodrine, TED hose and abdominal binder.  Initially there was some vasovagal element to his orthostasis but vasovagal issues seem to have resolved.  Echo with EF of 25 to 30% (previously 15 to 20%), diffuse hypokinesis G2-DD.  Cardiology following.  Radiation oncology consulted by oncology, and hoping to do MRI brain for better info about infiltrating enhancement in midbrain. However, patient has AICD which is " MRI conditional" and " not FDA approved". Cardiology willing to turn off AICD if radiology is willing to do MRI brain. Radiation oncology may order special CT with thin cuts .   Subjective: Afebrile overnight. INR therapeutic. He reports he was not able to work with PT yesterday due to his blood pressure. Feels improving. would like to talk to his oncology/cardiology regarding ?MRI  Assessment & Plan:  Slurred speech/LUE paresthesia/unsteady gait in patient with prior right MCA CVA/acute metabolic encephalopathy on admission- CT head with and without contrast did not reveal CVA but possible pontine and medullary infiltrating lesion  concerning for metastatic disease.  Unfortunately, his ICD is incompatible with MRI.  Echo EF of 25 to 30% (improved). EEG no seizure activity, UDS positive for cocaine.  INR therapeutic is being dosed by pharmacy, but subtherapeutic on admission. HBA1c 10.5%.  LDL 77.  Neurologically intact.  Continue current aspirin, Coumadin encourage PT OT.   Chronic systolic CHF status post AICD March/2021 Admission weight 137 pound> 152> 144 lb, decreasing.  On exam euvolemic.  Unable to use multiple cardiac medication due to his orthostatic hypotension.    Recurrent syncope/orthostatic hypotension: Appears to be improving initially concerned for vasovagal. He reports some hesitancy and sense of incomplete void for months.  Never seen urology.  Also concern about autonomic dysfunction in the setting of his brain mets.TTE as above EF 25 to 30%. Cortisol low which is expected while on dexamethasone.  TSH normal.Cardiology following-cardiac meds on hold.  He is on midodrine  10 mg 3 times daily,TED hose and abdominal binder.Continue to ambulate with PT OT.  Appreciate cardiology input.Titrate midodrine to keep orthostatic SBP > 90 and seated SBP<160 mm Hg.  Small cell lung cancer with brain mets status post whole brain radiation:CT head with and without contrast concerning for pontine and medullary infiltrates so on Decadron 4 mg every 12 hours per oncology -I discussed with patient  and he prefers to have MRI Brain.Discussed with Dr Mickeal Skinner and recommend MRI brain with and without contrast,alerted cardiology given PM needing to be turned off,have requested MRI brain to evalute his brain lesion further.  Essential hypertension: Antihypertensives on hold due to orthostatic hypotension.  Instead on midodrine.   CAD history occluded LAD with collaterals: Without chest pain,  continue home aspirin and statin.   HLD: On statin  Cocaine abuse: Cessation advised.  He is not forthcoming about his drug use.  Uncontrolled  type 2 diabetes mellitus with hyperglycemia, with long-term current use of insulin. hba1c 10.5, sugar appears to be improving continue to increase Lantus of 28 units twice daily, Premeal insulin 8 U and sliding scale insulin. Recent Labs  Lab 07/18/20 0734 07/18/20 1134 07/18/20 1638 07/18/20 2118 07/19/20 0752  GLUCAP 227* 252* 124* 157* 166*   Lethargy/possible delirium-seems to have resolved.  Is much more awake and interactive.  Leukocytosis likely from steroid monitor.Improving. Recent Labs  Lab 07/16/20 0543 07/18/20 0435  WBC 17.4* 15.0*  Debility/deconditioning/failure to thrive-expecting admission: will benefit with palliative care consultation at some point  Anemia of chronic disease: Hemoglobin is stable continue monitoring Recent Labs  Lab 07/14/20 0626 07/16/20 0543 07/18/20 0435  HGB 9.7* 10.4* 10.4*  HCT 31.9* 33.3* 33.0*    Nutrition: Diet Order            Diet Carb Modified Fluid consistency: Thin; Room service appropriate? Yes with Assist  Diet effective now                 Nutrition Problem: Increased nutrient needs Etiology: cancer and cancer related treatments (CHF, substance abuse) Signs/Symptoms: estimated needs Interventions: Ensure Enlive (each supplement provides 350kcal and 20 grams of protein)  Body mass index is 19.6 kg/m.  DVT prophylaxis: Place TED hose Start: 07/10/20 1054 SCD's Start: 07/08/20 0127 Code Status:   Code Status: Full Code  Family Communication: plan of care discussed with patient at bedside.  Status is: Inpatient Remains inpatient appropriate because:Inpatient level of care appropriate due to severity of illness  Dispo: The patient is from: Home              Anticipated d/c is to: Home w/ [facility]              Anticipated d/c date is: 1 day              Patient currently is not medically stable to d/c. Consultants:see note  Procedures:see note  Culture/Microbiology    Component Value Date/Time   SDES URINE,  CLEAN CATCH 01/30/2015 1650   SPECREQUEST NONE 01/30/2015 1650   CULT NO GROWTH Performed at [facility]  01/30/2015 1650   REPTSTATUS 02/01/2015 FINAL 01/30/2015 1650    Other culture-see note  Medications: Scheduled Meds: . aspirin EC  81 mg Oral Daily  . atorvastatin  20 mg Oral Daily  . dexamethasone  4 mg Oral Q12H  . feeding supplement (ENSURE ENLIVE)  237 mL Oral BID BM  . insulin aspart  0-20 Units Subcutaneous TID WC  . insulin aspart  0-5 Units Subcutaneous QHS  . insulin aspart  8 Units Subcutaneous TID WC  . insulin glargine  28 Units Subcutaneous BID  . midodrine  10 mg Oral TID WC  . Warfarin - Pharmacist Dosing Inpatient   Does not apply q1600   Continuous Infusions:  Antimicrobials: Anti-infectives (From admission, onward)   None     Objective: Vitals: Today's Vitals   07/18/20 1512 07/18/20 1915 07/18/20 2120 07/19/20 0618  BP: (!) 143/93  (!) 156/96 128/90  Pulse: 90  84 84  Resp: (!) 21  (!) 21 18  Temp: 98.5 F (36.9 C)  97.9 F (36.6 C) 98.4 F (36.9 C)  TempSrc: Oral  Oral   SpO2: 96%  100% 98%  Weight:  Height:      PainSc:  0-No pain      Intake/Output Summary (Last 24 hours) at 07/19/2020 0807 Last data filed at 07/19/2020 0500 Gross per 24 hour  Intake 480 ml  Output 1000 ml  Net -520 ml   Filed Weights   07/16/20 1225 07/17/20 0532 07/18/20 0503  Weight: 67 kg 69.1 kg 65.5 kg   Weight change:   Intake/Output from previous day: 10/06 0701 - 10/07 0700 In: 480 [P.O.:480] Out: 1001 [Urine:1000; Stool:1] Intake/Output this shift: No intake/output data recorded.  Examination: General exam: AAO, at baseline, weak, old for his age, frail HEENT:Oral mucosa moist, Ear/Nose WNL grossly, dentition normal. Respiratory system: bilaterally clear,no wheezing or crackles,no use of accessory muscle Cardiovascular system: S1 & S2 +, No JVD,. Gastrointestinal system: Abdomen soft, NT,ND, BS+ Nervous System:Alert, awake, moving  his extremities Extremities: No edema, distal peripheral pulses palpable.  Skin: No rashes,no icterus. MSK: Normal muscle bulk,tone, power  Data Reviewed: I have personally reviewed labs and imaging studies CBC: Recent Labs  Lab 07/14/20 0626 07/16/20 0543 07/18/20 0435  WBC  --  17.4* 15.0*  HGB 9.7* 10.4* 10.4*  HCT 31.9* 33.3* 33.0*  MCV  --  94.9 95.7  PLT  --  422* 161   Basic Metabolic Panel: Recent Labs  Lab 07/16/20 0543 07/18/20 0435  NA 134* 137  K 4.5 4.4  CL 100 102  CO2 25 27  GLUCOSE 305* 149*  BUN 33* 34*  CREATININE 0.90 0.96  CALCIUM 9.6 9.5  MG 2.0  --   PHOS 2.8  --    GFR: Estimated Creatinine Clearance: 77.7 mL/min (by C-G formula based on SCr of 0.96 mg/dL). Liver Function Tests: Recent Labs  Lab 07/16/20 0543  ALBUMIN 2.9*   No results for input(s): LIPASE, AMYLASE in the last 168 hours. No results for input(s): AMMONIA in the last 168 hours. Coagulation Profile: Recent Labs  Lab 07/15/20 0622 07/16/20 0543 07/17/20 0525 07/18/20 0435 07/19/20 0447  INR 2.6* 2.9* 2.8* 2.7* 2.7*   Cardiac Enzymes: No results for input(s): CKTOTAL, CKMB, CKMBINDEX, TROPONINI in the last 168 hours. BNP (last 3 results) No results for input(s): PROBNP in the last 8760 hours. HbA1C: No results for input(s): HGBA1C in the last 72 hours. CBG: Recent Labs  Lab 07/18/20 0734 07/18/20 1134 07/18/20 1638 07/18/20 2118 07/19/20 0752  GLUCAP 227* 252* 124* 157* 166*   Lipid Profile: No results for input(s): CHOL, HDL, LDLCALC, TRIG, CHOLHDL, LDLDIRECT in the last 72 hours. Thyroid Function Tests: No results for input(s): TSH, T4TOTAL, FREET4, T3FREE, THYROIDAB in the last 72 hours. Anemia Panel: No results for input(s): VITAMINB12, FOLATE, FERRITIN, TIBC, IRON, RETICCTPCT in the last 72 hours. Sepsis Labs: No results for input(s): PROCALCITON, LATICACIDVEN in the last 168 hours.  No results found for this or any previous visit (from the past 240  hour(s)).   Radiology Studies: No results found.   LOS: 10 days   Antonieta Pert, MD [facility]  07/19/2020, 8:07 AM  In conjunction.

## 2020-07-19 NOTE — Progress Notes (Signed)
[facility] for Warfarin Indication: advanced (noncompaction) cardiomyopathy  No Known Allergies  Patient Measurements: Height: 6' (182.9 cm) Weight: 65.5 kg (144 lb 8 oz) IBW/kg (Calculated) : 77.6  Vital Signs: Temp: 97.5 F (36.4 C) (10/07 1205) BP: 139/89 (10/07 1205) Pulse Rate: 86 (10/07 1205)  Labs: Recent Labs    07/17/20 0525 07/18/20 0435 07/19/20 0447  HGB  --  10.4*  --   HCT  --  33.0*  --   PLT  --  387  --   LABPROT 28.8* 28.1* 27.8*  INR 2.8* 2.7* 2.7*  CREATININE  --  0.96  --     Estimated Creatinine Clearance: 77.7 mL/min (by C-G formula based on SCr of 0.96 mg/dL).  Medications:  Scheduled:  . aspirin EC  81 mg Oral Daily  . atorvastatin  20 mg Oral Daily  . dexamethasone  4 mg Oral Q12H  . feeding supplement (ENSURE ENLIVE)  237 mL Oral BID BM  . insulin aspart  0-20 Units Subcutaneous TID WC  . insulin aspart  0-5 Units Subcutaneous QHS  . insulin aspart  8 Units Subcutaneous TID WC  . insulin glargine  28 Units Subcutaneous BID  . midodrine  10 mg Oral TID WC  . 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 Per med rec and chart records on visit on 9/3, pt is on Warfarin 7.5 mg On Tues/Thur/Sat and 3.75 mg on all other days. Baseline INR subtherapeutic on admission; possible noncompliance.  Today, 07/19/20   INR now therapeutic, stable at 2.7  ( 10/6) Hgb yesterday low but improved after PRBC, now 10.4 ; Plt stable WNL  CT head negative for hemorrhage  No major drug-drug interactions with warfarin; continues on concomitant ASA from PTA  Goal of Therapy:  INR 2-3 Monitor platelets by anticoagulation protocol: Yes   Plan:   Warfarin 5mg  PO x1   Daily INR   For discharge, recommend resuming PTA dosing with INR check in 3-5 days  Monitor for signs  and symptoms of bleeding   Royetta Asal, PharmD, BCPS 07/19/2020 12:23 PM

## 2020-07-19 NOTE — Progress Notes (Addendum)
Progress Note  Patient Name: Dennis [name]. Date of Encounter: 07/19/2020  Primary Cardiologist: Glori Bickers, MD  Subjective   No CP or SOB, has not been OOB today No orthostatic charted since 10/05 Tolerating TED hose and abd binder  Inpatient Medications    Scheduled Meds: . aspirin EC  81 mg Oral Daily  . atorvastatin  20 mg Oral Daily  . dexamethasone  4 mg Oral Q12H  . feeding supplement (ENSURE ENLIVE)  237 mL Oral BID BM  . insulin aspart  0-20 Units Subcutaneous TID WC  . insulin aspart  0-5 Units Subcutaneous QHS  . insulin aspart  8 Units Subcutaneous TID WC  . insulin glargine  28 Units Subcutaneous BID  . midodrine  10 mg Oral TID WC  . Warfarin - Pharmacist Dosing Inpatient   Does not apply q1600   Continuous Infusions:  PRN Meds: acetaminophen **OR** acetaminophen (TYLENOL) oral liquid 160 mg/5 mL **OR** acetaminophen, ondansetron (ZOFRAN) IV, polyethylene glycol, senna-docusate   Vital Signs    Vitals:   07/18/20 1203 07/18/20 1512 07/18/20 2120 07/19/20 0618  BP: 118/90 (!) 143/93 (!) 156/96 128/90  Pulse: 96 90 84 84  Resp: 19 (!) 21 (!) 21 18  Temp: 98.4 F (36.9 C) 98.5 F (36.9 C) 97.9 F (36.6 C) 98.4 F (36.9 C)  TempSrc:  Oral Oral   SpO2: 99% 96% 100% 98%  Weight:      Height:        Intake/Output Summary (Last 24 hours) at 07/19/2020 0924 Last data filed at 07/19/2020 0500 Gross per 24 hour  Intake 480 ml  Output 1000 ml  Net -520 ml   Last 3 Weights 07/18/2020 07/17/2020 07/16/2020  Weight (lbs) 144 lb 8 oz 152 lb 5.4 oz 147 lb 11.3 oz  Weight (kg) 65.545 kg 69.1 kg 67 kg  Some encounter information is confidential and restricted. Go to Review Flowsheets activity to see all data.     Telemetry    SR, 1 paced beat, PVCs - Personally Reviewed  Physical Exam   GEN: WD, thin, male, No acute distress.   Neck: No JVD Cardiac: RRR, no murmur, no rubs, or gallops.  Respiratory: CTA bilaterally GI: Soft, nontender,  non-distended  MS: No edema; No deformity. Neuro:  Nonfocal  Psych: Normal affect   Labs    High Sensitivity Troponin:  No results for input(s): TROPONINIHS in the last 720 hours.    Cardiac EnzymesNo results for input(s): TROPONINI in the last 168 hours. No results for input(s): TROPIPOC in the last 168 hours.   Chemistry Recent Labs  Lab 07/16/20 0543 07/18/20 0435  NA 134* 137  K 4.5 4.4  CL 100 102  CO2 25 27  GLUCOSE 305* 149*  BUN 33* 34*  CREATININE 0.90 0.96  CALCIUM 9.6 9.5  ALBUMIN 2.9*  --   GFRNONAA >60 >60  GFRAA >60  --   ANIONGAP 9 8     Hematology Recent Labs  Lab 07/14/20 0626 07/16/20 0543 07/18/20 0435  WBC  --  17.4* 15.0*  RBC  --  3.51* 3.45*  HGB 9.7* 10.4* 10.4*  HCT 31.9* 33.3* 33.0*  MCV  --  94.9 95.7  MCH  --  29.6 30.1  MCHC  --  31.2 31.5  RDW  --  14.6 15.0  PLT  --  422* 387   Lab Results  Component Value Date   TSH 0.636 07/12/2020   Lab Results  Component Value Date  INR 2.7 (H) 07/19/2020   INR 2.7 (H) 07/18/2020   INR 2.8 (H) 07/17/2020    BNPNo results for input(s): BNP, PROBNP in the last 168 hours.   DDimer No results for input(s): DDIMER in the last 168 hours.   Radiology    No results found.  Cardiac Studies   2D echo 07/09/20  1. Since the last exam on 12/25/2019 LVEF has slightly improved from  15-20% to 25-30% with diffuse hypokinesis.  2. Left ventricular ejection fraction, by estimation, is 25 to 30%. The  left ventricle has severely decreased function. The left ventricle  demonstrates global hypokinesis. The left ventricular internal cavity size  was moderately dilated. There is mild  concentric left ventricular hypertrophy. Left ventricular diastolic  parameters are consistent with Grade II diastolic dysfunction  (pseudonormalization). Elevated left atrial pressure.  3. Right ventricular systolic function is normal. The right ventricular  size is normal. There is normal pulmonary artery  systolic pressure.  4. A small pericardial effusion is present. The pericardial effusion is  posterior to the left ventricle.  5. The mitral valve is normal in structure. Mild mitral valve  regurgitation. No evidence of mitral stenosis.  6. The aortic valve is normal in structure. Aortic valve regurgitation is  trivial. No aortic stenosis is present.  7. The inferior vena cava is normal in size with greater than 50%  respiratory variability, suggesting right atrial pressure of 3 mmHg.   Patient Profile     59 y.o. 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 dual chamber ICD 2011, CAD with totally occluded LAD with collaterals by cath 12/2019 (negative stress test 09/2018), prior stroke in 2016, HLD, DM2, polysubstance abuse (tobacco, cocaine, alcohol), small cell lung CA with brain mets (dx 12/2019) s/p whole brain radiation and undergoing chemotherapy, and noncompliance who was admitted 09/26 with slurred speech, LUE parasthesia, unsteady gait. CT head with and without contrast did not reveal CVA but possible pontine and medullary infiltrating lesion concerning for metastatic disease. MRI not pursued due to ICD.EEG without seizure activity. UDS was + for cocaine. A1c 10.5%.  During admission he was also noted to have episodes of syncope while voiding without corresponding arrhythmias on telemetry, also reported syncope at home, likely due to severe orthostasis. ICD interrogation without any atrial or ventricular arrhythmias, device not scheduled to record anything less than 214bpm, anticipated ERI 3.8 months.   Assessment & Plan     1. Slurred speech/LUE parasthesia/unsteady gait - ICD not compatible w/ MRI - decadron per Oncology - per Dr C note 10/05 "His device is not officially "MRI-conditional", but he does not require pacing, has not received therapy for VT/VF and does not have abandoned leads. It appears that  MRI is critical for his care. We can turn off tachycardia detection and therapies so that he can have MRI. Although not "FDA-approved", he can have an MRI scan with standard ICD precautions if Radiology agrees. Device is close to ERI. Consider permanently deactivating tachy therapies and not performing a generator chngeout in view of limited prognosis from lung Ca." - per IM/Oncology  2. Recurrent syncope, suspected due to severe orthostasis - SBP 114 >>69 and 123 >> 71 on 10/05, none since - follow orthostatics w/ TED hose and binder in place - possible contribution of DM neuropathy and brain mets to sx   3. Chronic systolic CHF with ventricular noncompaction - no volume overload by exam -  EF 25-30% on echo - baseline BP has improved, MD advise on restarting Entresto, Coreg or Spiro - outpt compliance has been poor  4. CAD with occluded LAD - no ischemic sx - on ASA, statin - w/ pt on coumadin, MD advise on continuing ASA  5. Polysubstance abuse with recurrent cocaine use - cessation important - per IM  For questions or updates, please contact [place] Please consult [email or web address] for contact info under Cardiology/STEMI.  Signed, Rosaria Ferries, PA-C 07/19/2020, 9:24 AM    I have seen and examined the patient along with Rosaria Ferries, PA-C.  I have reviewed the chart, notes and new data.  I agree with PA/NP's note.  Key new complaints: no syncope since starting midodrine, also compression stockings and abdominal binder Key examination changes: clinically euvolemic. Orthostatic VS have not been checked in last 2 days Key new findings / data: normal renal parameters, WBC declining  PLAN: We cannot restart HF meds while on midodrine. His BP is better and would first see how he does on a lower dose of midodrine, 5 mg TID (but would check orthostatic VS before changing dose).  [facility] will sign off.   Medication Recommendations:  Titrate midodrine to keep  orthostatic SBP > 90 and seated SBP<160 mm Hg. Keep off HF meds while on midodrine Other recommendations (labs, testing, etc):  n/a Follow up as an outpatient:  Please call us when ready for DC to make f/u arrangements.  If the decision is reached to proceed with brain MRI, please let us know to arrange ICD reprogramming.   Sanda Klein, MD, [place] [phone] 07/19/2020, 10:54 AM

## 2020-07-19 NOTE — Care Management Important Message (Signed)
Important Message  Patient Details IM Letter given to the Patient Name: Dennis [name]. MRN: [id number] Date of Birth: April 14, 1961   Medicare Important Message Given:  Yes     Kerin Salen 07/19/2020, 12:09 PM

## 2020-07-19 NOTE — Progress Notes (Signed)
Physical Therapy Treatment Patient Details Name: Dennis [name]. MRN: [id number] DOB: [birth year] Today's Date: 07/19/2020    History of Present Illness 59 yo male admitted with unsteady gait, slurred speech. Hx of CHF, DM, CVA, pacemaker, MDD, metastatic lung ca, polysubstance abuse    PT Comments    Assisted OOB to amb.  TEDS on and applied ABD binder.  General Gait Details: Took orthostatic BP's with nursing student.  She entered in EPIC.  Limited amb distance due to drastic drop in BP plus pt severely diaphoretic sweating through his gown.  Asked NT to take a glucose it was 140.  HR was WNL 82.  Pt just repeats 'I feel fine, lets keep walking".  Therapist declined and assisted to recliner and rolled back to room. Supine     BP 135/97   HR 90 EOB         BP  95/72    HR 93 Standing   BP 68/55  HR 103 3 min        BP 78/61  HR 104  MAX diaphoretic sweating thru his gown  Glucose checked 140   Follow Up Recommendations  Supervision for mobility/OOB;Home health PT     Equipment Recommendations  None recommended by PT    Recommendations for Other Services       Precautions / Restrictions Precautions Precautions: Fall Precaution Comments: ABD binder and Thigh Highs for Orthostatic BP's Restrictions Weight Bearing Restrictions: No    Mobility  Bed Mobility Overal bed mobility: Modified Independent Bed Mobility: Supine to Sit     Supine to sit: Supervision     General bed mobility comments: increased time  Transfers Overall transfer level: Needs assistance Equipment used: None Transfers: Sit to/from Stand Sit to Stand: Min guard Stand pivot transfers: Min guard       General transfer comment: min guard for all mobility for safety secondary to orthostatic HTN.   Ambulation/Gait Ambulation/Gait assistance: Min guard;Min assist Gait Distance (Feet): 45 Feet Assistive device: Rolling walker (2 wheeled) Gait Pattern/deviations: Step-through pattern;Decreased stance  time - left;Drifts right/left Gait velocity: decreased   General Gait Details: Took orthostatic BP's with nursing student.  She entered in EPIC.  Limited amb distance due to drastic drop in BP plus pt severely diaphoretic sweating through his gown.  Asked NT to take a glucose it was 140.  HR was WNL 82.  Pt just repeats 'I feel fine, lets keep walking".  Therapist declined and assisted to recliner and rolled back to room.   Stairs             Wheelchair Mobility    Modified Rankin (Stroke Patients Only)       Balance                                            Cognition Arousal/Alertness: Awake/alert Behavior During Therapy: WFL for tasks assessed/performed Overall Cognitive Status: Within Functional Limits for tasks assessed                                 General Comments: AxO x 3 very nice      Exercises      General Comments        Pertinent Vitals/Pain Pain Assessment: No/denies pain    Home Living  Prior Function            PT Goals (current goals can now be found in the care plan section) Progress towards PT goals: Progressing toward goals    Frequency    Min 3X/week      PT Plan Current plan remains appropriate    Co-evaluation              AM-PAC PT "6 Clicks" Mobility   Outcome Measure  Help needed turning from your back to your side while in a flat bed without using bedrails?: None Help needed moving from lying on your back to sitting on the side of a flat bed without using bedrails?: A Little Help needed moving to and from a bed to a chair (including a wheelchair)?: A Little Help needed standing up from a chair using your arms (e.g., wheelchair or bedside chair)?: A Little Help needed to walk in hospital room?: A Little Help needed climbing 3-5 steps with a railing? : A Lot 6 Click Score: 18    End of Session Equipment Utilized During Treatment: Gait  belt Activity Tolerance: Other (comment) (hypotensive) Patient left: in chair;with call bell/phone within reach;with chair alarm set Nurse Communication: Mobility status PT Visit Diagnosis: Difficulty in walking, not elsewhere classified (R26.2)     Time: 7092-9574 PT Time Calculation (min) (ACUTE ONLY): 26 min  Charges:  $Gait Training: 8-22 mins $Therapeutic Activity: 8-22 mins                     Rica Koyanagi  PTA Acute  Rehabilitation Services Pager      [phone] Office      [phone]

## 2020-07-20 ENCOUNTER — Ambulatory Visit (HOSPITAL_COMMUNITY)
Admit: 2020-07-20 | Discharge: 2020-07-20 | Disposition: A | Discharge status: Home / Self Care | Payer: Medicare Other | Attending: Internal Medicine | Admitting: Internal Medicine

## 2020-07-20 DIAGNOSIS — C3490 Malignant neoplasm of unspecified part of unspecified bronchus or lung: Secondary | ICD-10-CM

## 2020-07-20 DIAGNOSIS — R2681 Unsteadiness on feet: Secondary | ICD-10-CM | POA: Diagnosis not present

## 2020-07-20 LAB — CBC
HCT: 34.7 % — ABNORMAL LOW (ref 39.0–52.0)
Hemoglobin: 10.7 g/dL — ABNORMAL LOW (ref 13.0–17.0)
MCH: 29.8 pg (ref 26.0–34.0)
MCHC: 30.8 g/dL (ref 30.0–36.0)
MCV: 96.7 fL (ref 80.0–100.0)
Platelets: 318 10*3/uL (ref 150–400)
RBC: 3.59 MIL/uL — ABNORMAL LOW (ref 4.22–5.81)
RDW: 15.3 % (ref 11.5–15.5)
WBC: 14.4 10*3/uL — ABNORMAL HIGH (ref 4.0–10.5)
nRBC: 0 % (ref 0.0–0.2)

## 2020-07-20 LAB — GLUCOSE, CAPILLARY
Glucose-Capillary: 168 mg/dL — ABNORMAL HIGH (ref 70–99)
Glucose-Capillary: 439 mg/dL — ABNORMAL HIGH (ref 70–99)
Glucose-Capillary: 162 mg/dL — ABNORMAL HIGH (ref 70–99)
Glucose-Capillary: 74 mg/dL (ref 70–99)
Glucose-Capillary: 102 mg/dL — ABNORMAL HIGH (ref 70–99)

## 2020-07-20 LAB — BASIC METABOLIC PANEL
Anion gap: 8 (ref 5–15)
BUN: 40 mg/dL — ABNORMAL HIGH (ref 6–20)
CO2: 28 mmol/L (ref 22–32)
Calcium: 9.6 mg/dL (ref 8.9–10.3)
Chloride: 100 mmol/L (ref 98–111)
Creatinine, Ser: 0.97 mg/dL (ref 0.61–1.24)
GFR calc non Af Amer: >60 mL/min (ref >60)
Glucose, Bld: 158 mg/dL — ABNORMAL HIGH (ref 70–99)
Potassium: 4.3 mmol/L (ref 3.5–5.1)
Sodium: 136 mmol/L (ref 135–145)

## 2020-07-20 LAB — PROTIME-INR
INR: 2.7 — ABNORMAL HIGH (ref 0.8–1.2)
Prothrombin Time: 27.7 seconds — ABNORMAL HIGH (ref 11.4–15.2)

## 2020-07-20 IMAGING — MR MR HEAD WO/W CM
14 of 16 series · 42 of 48 positions shown · IV contrast (Gadavist)
Comparison: Head CT [DATE]. Multiple other CT studies as
distant as [DATE].

CLINICAL DATA: Small cell lung cancer. Brain metastases. Reassess.

EXAM:
MRI HEAD WITHOUT AND WITH CONTRAST
TECHNIQUE: Multiplanar, multiecho pulse sequences of the brain and surrounding
structures were obtained without and with intravenous contrast.
CONTRAST:  6.5mL GADAVIST GADOBUTROL 1 MMOL/ML IV SOLN

[Series 5: DWI · axial · 3.0mm · 0.88mm/px · z∈[-89,+52]mm · 8 of 100 slices shown (1 of 4)]
[im 1/100]
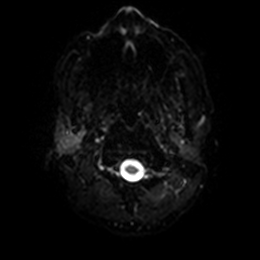
[im 15/100]
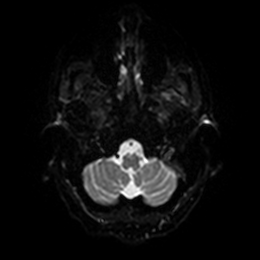
[im 29/100]
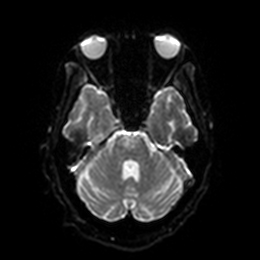
[im 43/100]
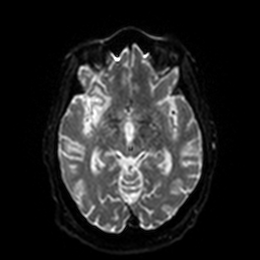
[im 57/100]
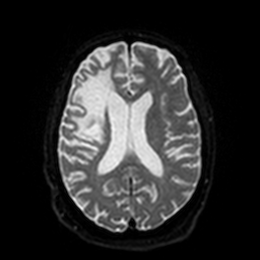
[im 71/100]
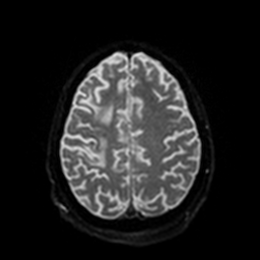
[im 85/100]
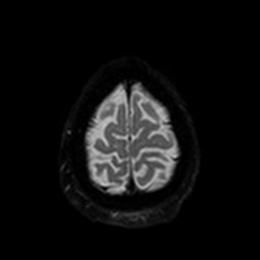
[im 100/100]
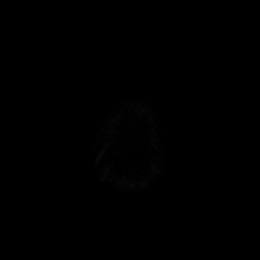

[Series 6: DWI · axial · 3.0mm · 0.88mm/px · z∈[-89,+52]mm · 3 of 50 slices shown (2 of 4)]
[im 1/50]
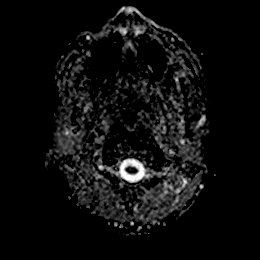
[im 25/50]
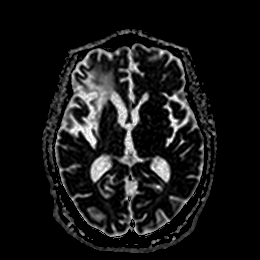
[im 50/50]
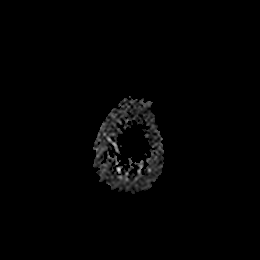

[Series 7: DWI · coronal · 4.0mm · 0.88mm/px · 5 of 72 slices shown (3 of 4)]
[im 1/72]
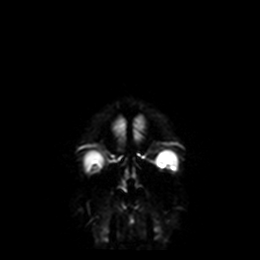
[im 18/72]
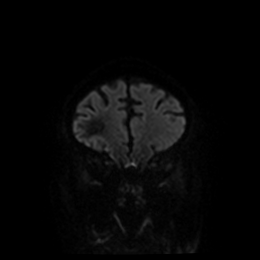
[im 36/72]
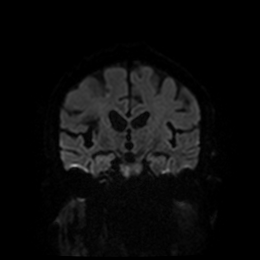
[im 54/72]
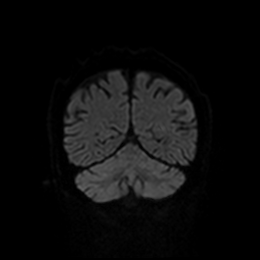
[im 72/72]
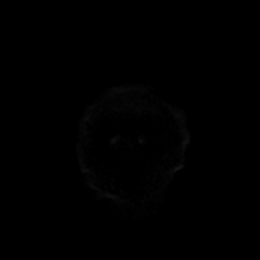

[Series 8: DWI · coronal · 4.0mm · 0.88mm/px · 2 of 36 slices shown (4 of 4)]
[im 1/36]
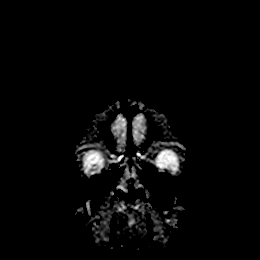
[im 36/36]
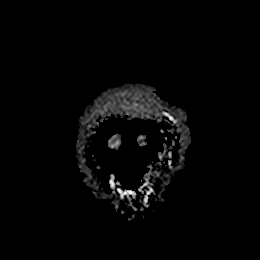

[Series 9: T1 · sagittal · 5.0mm · 0.75mm/px · 2 of 23 slices shown]
[im 1/23]
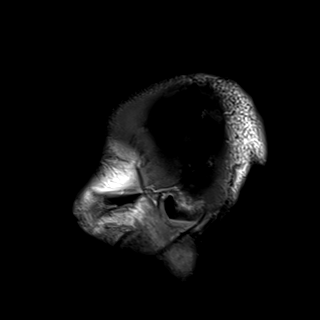
[im 23/23]
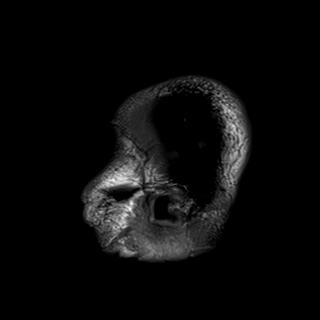

[Series 10: T2 · axial · 5.0mm · 0.72mm/px · z∈[-84,+55]mm · 2 of 25 slices shown]
[im 1/25]
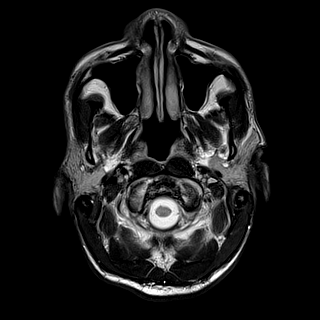
[im 25/25]
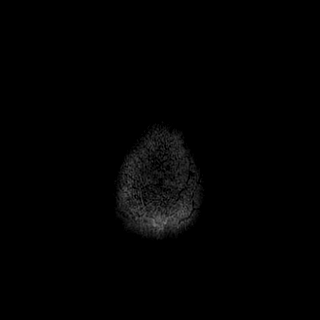

[Series 11: FLAIR · axial · 5.0mm · 0.45mm/px · z∈[-83,+56]mm · 2 of 25 slices shown]
[im 1/25]
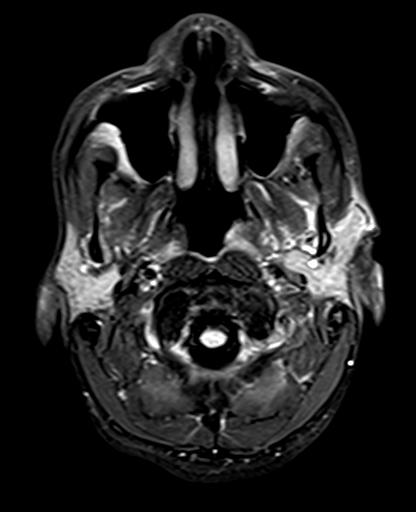
[im 25/25]
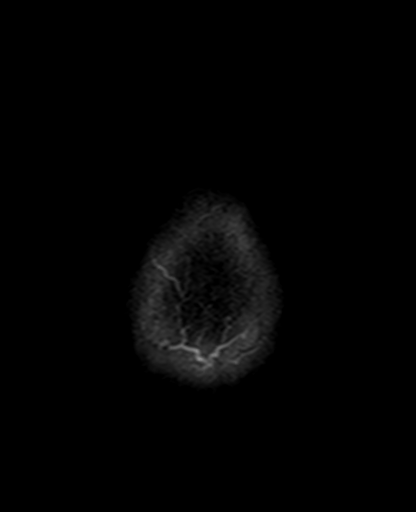

[Series 12: mag_images · axial · 3.0mm · 0.90mm/px · z∈[-88,+47]mm · 3 of 48 slices shown]
[im 1/48]
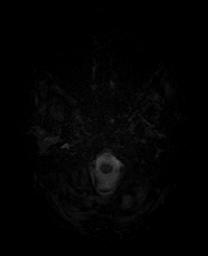
[im 24/48]
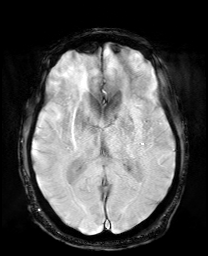
[im 48/48]
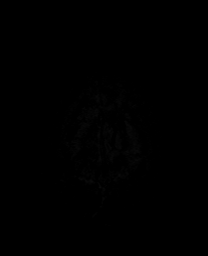

[Series 13: pha_images · axial · 3.0mm · 0.90mm/px · z∈[-88,+47]mm · 3 of 48 slices shown]
[im 1/48]
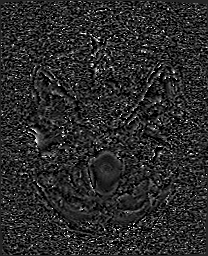
[im 24/48]
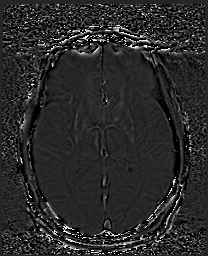
[im 48/48]
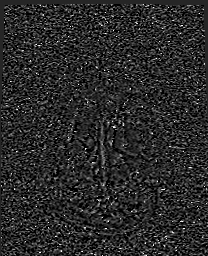

[Series 14: swi_images · axial · 3.0mm · 0.90mm/px · z∈[-88,+47]mm · 3 of 48 slices shown]
[im 1/48]
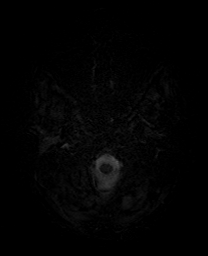
[im 24/48]
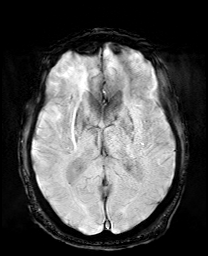
[im 48/48]
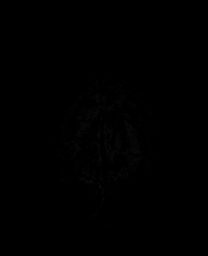

[Series 15: mip_images(sw) · axial · 24.0mm · 0.90mm/px · z∈[-78,+37]mm · 3 of 41 slices shown]
[im 1/41]
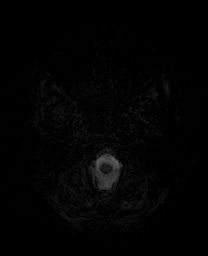
[im 21/41]
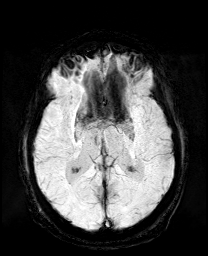
[im 41/41]
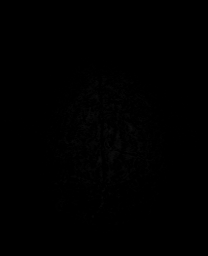

[Series 17: T2 post-contrast · coronal · 5.0mm · 0.72mm/px · 2 of 31 slices shown]
[im 1/31]
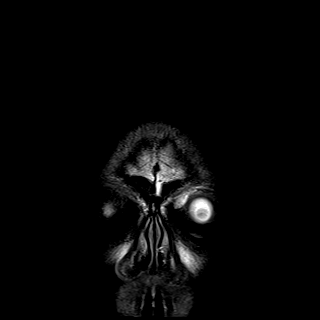
[im 31/31]
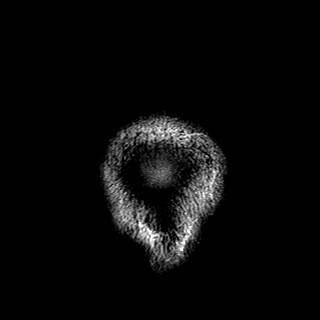

[Series 19: T1 post-contrast · coronal · 5.0mm · 0.34mm/px · 2 of 31 slices shown (1 of 2)]
[im 1/31]
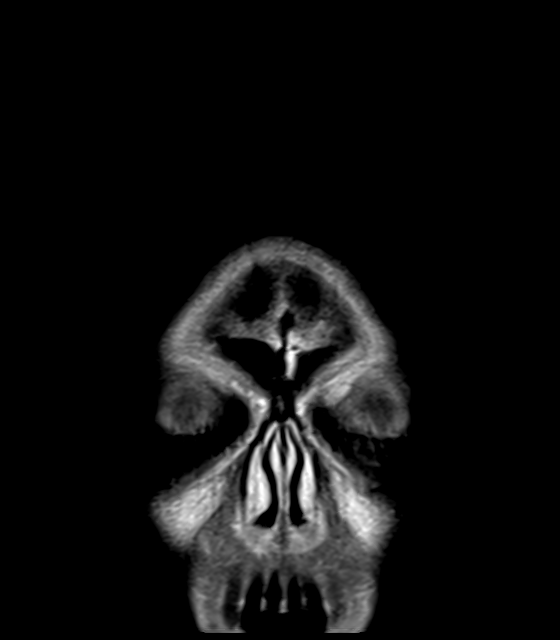
[im 31/31]
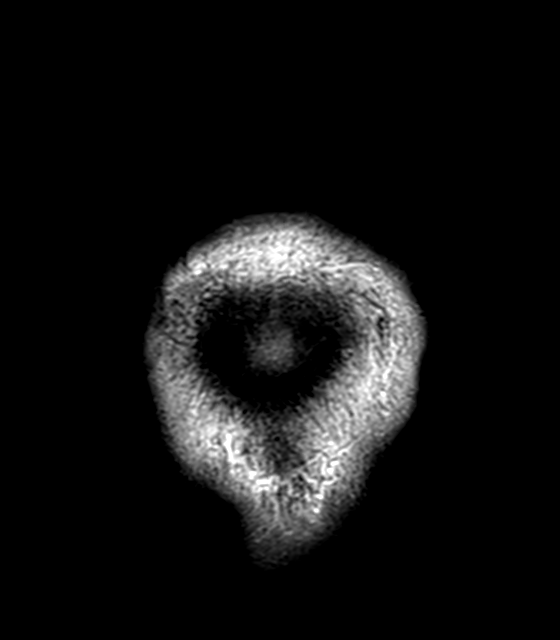

[Series 20: T1 post-contrast · sagittal · 5.0mm · 0.75mm/px · 2 of 23 slices shown (2 of 2)]
[im 1/23]
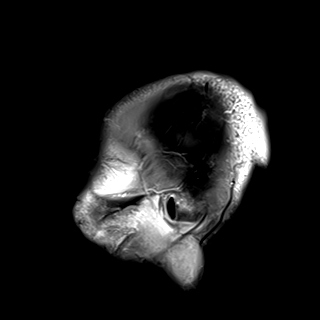
[im 23/23]
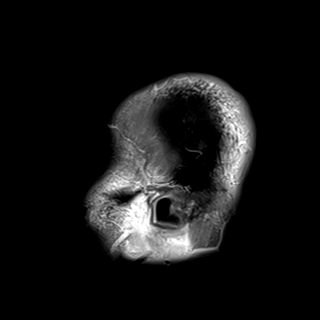

[42 of 48 positions shown; findings below may reference images not displayed]

FINDINGS: Brain: Diffusion imaging does not show any acute or subacute
infarction. There is old infarction of the inferior division right
MCA on the right affecting the insula and frontal operculum. This
has progressed to atrophy and encephalomalacia. Elsewhere, cerebral
hemispheres do not show any ischemic insults. Old small vessel
infarction left cerebellum no hydrocephalus or extra-axial
collection.

Resolution of the previously seen right frontal metastasis. No
evidence of residual disease in that location. Newly seen abnormal
enhancement affecting the ventral surface the pons the level of the
pontomedullary junction to the mid brain. This is an unusual
appearance and is presumed to be subsequent to metastatic disease.
Some potential this could relate to treatment effect higher doses
were employed in this region, but I do not know why that would have
been the case.

Vascular: Major vessels at the base of the brain show flow.

Skull and upper cervical spine: Negative

Sinuses/Orbits: Clear/normal

Other: None
IMPRESSION: Newly seen abnormal enhancement affecting the ventral pons from the
pontomedullary junction to the mid brain. This is presumed represent
metastatic disease. The possibility of treatment effect is
considered but felt unlikely.

Resolution of the previously seen right frontal metastasis.

Old right MCA territory infarction.

## 2020-07-20 MED ORDER — GADOBUTROL 1 MMOL/ML IV SOLN
6.5000 mL | Freq: Once | INTRAVENOUS | Status: AC | PRN
  Administered 2020-07-20: 6.5 mL via INTRAVENOUS

## 2020-07-20 MED ORDER — WARFARIN SODIUM 2.5 MG PO TABS
3.7500 mg | ORAL_TABLET | Freq: Once | ORAL | Status: AC
  Administered 2020-07-20: 3.75 mg via ORAL
  Filled 2020-07-20: qty 1

## 2020-07-20 NOTE — Progress Notes (Signed)
Patient placed on yellow mews protocol due to change in vital signs and recent fall triggering mews event.

## 2020-07-20 NOTE — Progress Notes (Signed)
[place] for Warfarin Indication: advanced (noncompaction) cardiomyopathy  No Known Allergies  Patient Measurements: Height: 6' (182.9 cm) Weight: 65.5 kg (144 lb 8 oz) IBW/kg (Calculated) : 77.6  Vital Signs: Temp: 97.5 F (36.4 C) (10/08 0605) Temp Source: Oral (10/08 0605) BP: 116/90 (10/08 1000) Pulse Rate: 82 (10/08 1000)  Labs: Recent Labs    07/18/20 0435 07/19/20 0447 07/20/20 0422  HGB 10.4*  --  10.7*  HCT 33.0*  --  34.7*  PLT 387  --  318  LABPROT 28.1* 27.8* 27.7*  INR 2.7* 2.7* 2.7*  CREATININE 0.96  --  0.97    Estimated Creatinine Clearance: 76.9 mL/min (by C-G formula based on SCr of 0.97 mg/dL).  Medications:  Scheduled:  . aspirin EC  81 mg Oral Daily  . atorvastatin  20 mg Oral Daily  . dexamethasone  4 mg Oral Q12H  . feeding supplement (ENSURE ENLIVE)  237 mL Oral BID BM  . insulin aspart  0-20 Units Subcutaneous TID WC  . insulin aspart  0-5 Units Subcutaneous QHS  . insulin aspart  8 Units Subcutaneous TID WC  . insulin glargine  28 Units Subcutaneous BID  . midodrine  10 mg Oral TID WC  . 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 Per med rec and chart records on visit on 9/3, pt is on Warfarin 7.5 mg On Tues/Thur/Sat and 3.75 mg on all other days. Baseline INR subtherapeutic on admission; possible noncompliance.  Today, 07/20/20   INR now therapeutic, stable at 2.7  Hgb and Plts stable  CT head negative for hemorrhage  No major drug-drug interactions with warfarin; continues on concomitant ASA from PTA  Goal of Therapy:  INR 2-3 Monitor platelets by anticoagulation protocol: Yes   Plan:   Warfarin 3.75mg  PO x1 per home regimen above  Daily INR   For discharge, recommend resuming PTA dosing with INR check in 3-5 days  Monitor for  signs and symptoms of bleeding   Adrian Saran, PharmD, BCPS [phone] until 3pm 07/20/2020 10:58 AM

## 2020-07-20 NOTE — Progress Notes (Signed)
Nutrition Follow-up  DOCUMENTATION CODES:   Not applicable  INTERVENTION:  - continue Ensure Enlive BID.   NUTRITION DIAGNOSIS:   Increased nutrient needs related to cancer and cancer related treatments (CHF, substance abuse) as evidenced by estimated needs. -ongoing  GOAL:   Patient will meet greater than or equal to 90% of their needs -met on average   MONITOR:   PO intake, Supplement acceptance, Labs, Weight trends, I & O's  ASSESSMENT:   59 year old male with history of right MCA CVA, small cell lung cancer with brain mets s/p whole brain radiation, chronic CHF s/p ICD in 12/2019, IDDM-2 and cocaine use presenting with unsteady gait, slurred speech and LUE numbness admitted with concern for acute on chronic CVA.  He has been eating 75-100% of meals mainly over the past 4 days. He has been accepting Ensure Enlive 90% of the time offered. Weight has been trending up since admission on 9/26.   Patient is currently out of the room for MRI. Consult in place for Palliative Care.    Per notes: - small cell lung cancer with brain mets s/p whole brain XRT - recurrent syncope/orthostatic hypotension PTA   Labs reviewed; CBGs: 168 and 439 mg/dl, BUN: 40 mg/dl.  Medications reviewed; sliding scale novolog, 8 units novolog TID, 28 units lantus BID.    NUTRITION - FOCUSED PHYSICAL EXAM:  unable to complete at this time.   Diet Order:   Diet Order            Diet Carb Modified Fluid consistency: Thin; Room service appropriate? Yes with Assist  Diet effective now                 EDUCATION NEEDS:   Not appropriate for education at this time  Skin:  Skin Assessment: Reviewed RN Assessment  Last BM:  10/6  Height:   Ht Readings from Last 1 Encounters:  07/08/20 6' (1.829 m)    Weight:   Wt Readings from Last 1 Encounters:  07/18/20 65.5 kg    Estimated Nutritional Needs:  Kcal:  1850-2050 Protein:  90-105g Fluid:  2L/day     Jarome Matin, MS, RD,  LDN, CNSC Inpatient Clinical Dietitian RD pager # available in AMION  After hours/weekend pager # available in [facility]

## 2020-07-20 NOTE — Progress Notes (Signed)
   07/20/20 1930  What Happened  Was fall witnessed? No  Was patient injured? No  Patient found on floor  Found by Staff-comment (NT)  Stated prior activity other (comment) (pt does not remember what happened)  Follow Up  MD notified [place]  Time MD notified [place] notified No - patient refusal  Simple treatment Other (comment) (no injury noted)  Adult Fall Risk Assessment  Risk Factor Category (scoring not indicated) Fall has occurred during this admission (document High fall risk)  Patient Fall Risk Level High fall risk  Adult Fall Risk Interventions  Required Bundle Interventions *See Row Information* High fall risk - low, moderate, and high requirements implemented  Additional Interventions Room near nurses station;Use of appropriate toileting equipment (bedpan, BSC, etc.)  Screening for Fall Injury Risk (To be completed on HIGH fall risk patients) - Assessing Need for Low Bed  Risk For Fall Injury- Low Bed Criteria Previous fall this admission  Will Implement Low Bed and Floor Mats Yes  Screening for Fall Injury Risk (To be completed on HIGH fall risk patients who do not meet crieteria for Low Bed) - Assessing Need for Floor Mats Only  Risk For Fall Injury- Criteria for Floor Mats None identified - No additional interventions needed  Will Implement Floor Mats Yes  Vitals  Temp 98.2 F (36.8 C)  Temp Source Oral  BP (!) 84/57  MAP (mmHg) 65  BP Location Right Arm  BP Method Automatic  Patient Position (if appropriate) Sitting  Pulse Rate (!) 101  Pulse Rate Source Monitor  Resp 18  Oxygen Therapy  SpO2 100 %  O2 Device Room Air  Pain Assessment  Pain Scale 0-10  Pain Score 0  PCA/Epidural/Spinal Assessment  Respiratory Pattern Regular;Unlabored  Neurological  Neuro (WDL) X  Level of Consciousness Alert  Orientation Level Oriented X4  Cognition Appropriate at baseline;Memory impairment  Speech Clear  Musculoskeletal  Musculoskeletal (WDL) X  Assistive  Device BSC  Generalized Weakness Yes  Integumentary  Integumentary (WDL) WDL  NT got patient up to [facility]. NT stepped out of room and heard noise. NT entered room and patient on floor sitting on knees. Pt does not remember what happened. Pt diaphoretic. CBG 74, BP 84/57.  Patient assisted back up to [facility] per his request to attempts bowel movement. Patient within arms reach. Patient assisted back to bed, gait unsteady. MD paged. Pt refused to have family called.

## 2020-07-20 NOTE — Progress Notes (Signed)
Chaplain made a "cold call" without a referral. Patient was alert and responsive to visit.  "I just got bad news" he said.  "It is what it is." Patient's brother was bedside.  Janari Yamada is patient's youngest brother. Patient is oldest of 3 sons. Patient is not married. He has a 58 yo daughter, Million Maharaj.  Ms. Hairfield is not on the FaceSheet.  If staff could add her, that would be helpful.  Here is her phone. [phone].  Chaplain discussed Advanced Directive at length with patient and brother.  He would wish for [name] and Jinny Blossom to be his primary and secondary HCPOA.  (not clear yet who would be named primary.) Patient's brother [name] desires conversation with nurse to hear what the doctor said to his brother. Chaplain will follow up on Monday.  Chaplain offered words from scripture and blessing as she departed. Rev. Tamsen Snider Pager [phone]

## 2020-07-20 NOTE — Progress Notes (Signed)
PROGRESS NOTE    Dennis [name].  XBM:[id number] DOB: 1960-12-01 DOA: 07/07/2020 PCP: [name], MD   Chief Complaint  Patient presents with  . 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  CT head without contrast without acute finding.  Neurology consulted.  Could not do MRI due to ICD.  Repeat CT head with contrast did not reveal CVA but concern about infiltrating enhancement in ventral pons and proximal medulla concerning of metastatic disease.  EEG without epileptiform or seizure activity.  Patient with significant orthostatic hypotension even after holding cardiac meds, starting midodrine, TED hose and abdominal binder.  Initially there was some vasovagal element to his orthostasis but vasovagal issues seem to have resolved.  Echo with EF of 25 to 30% (previously 15 to 20%), diffuse hypokinesis G2-DD.  Cardiology following.  Radiation oncology consulted by oncology, and hoping to do MRI brain for better info about infiltrating enhancement in midbrain. However, patient has AICD which is " MRI conditional" and " not FDA approved". Cardiology willing to turn off AICD if radiology is willing to do MRI brain. Radiation oncology may order special CT with thin cuts .  Based on location MRI has been recommended operatively with meeting by his radiation oncologist Dr Mauri Reading.  Subjective: No acute events overnight.  WBC downtrending. Going for MRI to [facility] in his back.  Nursing has been uncontrolled He is orthostatics again  Assessment & Plan:  Slurred speech/LUE paresthesia/unsteady gait in patient with prior right MCA CVA/acute metabolic encephalopathy on admission- CT head with and without contrast did not reveal CVA but  possible pontine and medullary infiltrating lesion concerning for metastatic disease.Echo EF of 25 to 30% (improved). EEG no seizure activity, UDS positive for cocaine.  INR therapeutic is being dosed by pharmacy, but subtherapeutic on admission. HBA1c 10.5%.  LDL 77.  Overall neurologically intact, continue aspirin Coumadin PT OT.   Chronic systolic CHF status post AICD March/2021 Admission weight 137 pound> 152> 144 lb, decreasing.  On exam euvolemic.  Unable to use multiple cardiac medication due to his orthostatic hypotension.    Recurrent syncope/orthostatic hypotension: Appears to be improving initially concerned for vasovagal. He reports some hesitancy and sense of incomplete void for months.  Never seen urology.  Also concern about autonomic dysfunction in the setting of his brain mets.TTE as above EF 25 to 30%. Cortisol low which is expected while on dexamethasone.  TSH normal.Cardiology following-cardiac meds on hold.  Continue midodrine 10 mg 3 times daily, TED hose and abdominal binder. Cont ptot. Appreciate cardiology input.Titrate midodrine to keep orthostatic SBP > 90 and seated SBP<160 mm Hg.  Still orthostatic-high risk for fall readmission and further complication.  Continue to work with PT OT  Small cell lung cancer with brain mets status post whole brain radiation:CT head with and without contrast concerning for pontine and medullary infiltrates so on Decadron 4 mg every 12 hours per oncology -I discussed with patient  and he prefers to have MRI Brain. Discussed with Dr Mickeal Skinner and recommend MRI brain with and without contrast, he has discussed the tumor board.  Aware and would be safe to download the pacer for his MRI.  He had completed MRI brain today will  follow-up the results- "Newly  seen abnormal enhancement affecting the ventral pons from the pontomedullary junction to the mid brain. This is presumed represent metastatic disease. The possibility of treatment effect is considered but  felt unlikely. Resolution of the previously seen right frontal metastasis. Old right MCA territory infarction" Spoke w/ Dr Mickeal Skinner and likely tumor, he will let radio onc know for possible fractionated radiosurgery ( likely outpatient ?, tumor board meeting monday).  Essential hypertension: BP stable.  On midodrine.  Blood pressure medicine on hold.   CAD history occluded LAD with collaterals: No chest pain.  Continue aspirin and statin.   HLD: On statin  Cocaine abuse: Cessation advised.  He is not forthcoming about his drug use.  Uncontrolled type 2 diabetes mellitus with hyperglycemia, with long-term current use of insulin. hba1c 10.5,Cont on Lantus of 28 units twice daily, Premeal insulin 8 U and sliding scale insulin.  Blood sugar is poorly controlled this afternoon, did not get coverage this morning as was in MRI.  Monitor and adjust. Recent Labs  Lab 07/19/20 1406 07/19/20 1656 07/19/20 2101 07/20/20 0732 07/20/20 1229  GLUCAP 140* 171* 93 168* 439*   Lethargy/possible delirium-seems to have resolved.  Is much more awake and interactive.  Leukocytosis likely from steroid monitor.downtrending Recent Labs  Lab 07/16/20 0543 07/18/20 0435 07/20/20 0422  WBC 17.4* 15.0* 14.4*   Debility/deconditioning/failure to thrive: will benefit with palliative care consultation.  Palliative care plans visiting tomorrow after his MRI.    Anemia of chronic disease: Hemoglobin is stable continue monitoring Recent Labs  Lab 07/14/20 0626 07/16/20 0543 07/18/20 0435 07/20/20 0422  HGB 9.7* 10.4* 10.4* 10.7*  HCT 31.9* 33.3* 33.0* 34.7*    Nutrition: Diet Order            Diet Carb Modified Fluid consistency: Thin; Room service appropriate? Yes with Assist  Diet effective now                 Nutrition Problem: Increased nutrient needs Etiology: cancer and cancer related treatments (CHF, substance abuse) Signs/Symptoms: estimated needs Interventions: Ensure Enlive (each  supplement provides 350kcal and 20 grams of protein)  Body mass index is 19.6 kg/m.  DVT prophylaxis: Place TED hose Start: 07/10/20 1054 SCD's Start: 07/08/20 0127 Code Status:   Code Status: Full Code  Family Communication: plan of care discussed with patient at bedside.  Status is: Inpatient Remains inpatient appropriate because:Inpatient level of care appropriate due to severity of illness  Dispo: The patient is from: Home              Anticipated d/c is to: Home w/ [facility]              Anticipated d/c date is: 1-2 day              Patient currently is not medically stable to d/c. Consultants:see note  Procedures:see note  Culture/Microbiology    Component Value Date/Time   SDES URINE, CLEAN CATCH 01/30/2015 1650   SPECREQUEST NONE 01/30/2015 1650   CULT NO GROWTH Performed at [facility]  01/30/2015 1650   REPTSTATUS 02/01/2015 FINAL 01/30/2015 1650    Other culture-see note  Medications: Scheduled Meds: . aspirin EC  81 mg Oral Daily  . atorvastatin  20 mg Oral Daily  . dexamethasone  4 mg Oral Q12H  . feeding supplement (ENSURE ENLIVE)  237 mL Oral BID BM  . insulin aspart  0-20 Units Subcutaneous TID WC  . insulin aspart  0-5 Units Subcutaneous QHS  .  insulin aspart  8 Units Subcutaneous TID WC  . insulin glargine  28 Units Subcutaneous BID  . midodrine  10 mg Oral TID WC  . warfarin  3.75 mg Oral ONCE-1600  . Warfarin - Pharmacist Dosing Inpatient   Does not apply q1600   Continuous Infusions:  Antimicrobials: Anti-infectives (From admission, onward)   None     Objective: Vitals: Today's Vitals   07/20/20 0605 07/20/20 0738 07/20/20 1000 07/20/20 1231  BP: 121/89  116/90 123/85  Pulse: 81  82 97  Resp: 18  18 17   Temp: (!) 97.5 F (36.4 C)   97.6 F (36.4 C)  TempSrc: Oral   Oral  SpO2: 99%  100% 99%  Weight:      Height:      PainSc:  0-No pain      Intake/Output Summary (Last 24 hours) at 07/20/2020 1322 Last data filed at 07/20/2020  1024 Gross per 24 hour  Intake 480 ml  Output 500 ml  Net -20 ml   Filed Weights   07/16/20 1225 07/17/20 0532 07/18/20 0503  Weight: 67 kg 69.1 kg 65.5 kg   Weight change:   Intake/Output from previous day: 10/07 0701 - 10/08 0700 In: 720 [P.O.:720] Out: 500 [Urine:500] Intake/Output this shift: Total I/O In: 240 [P.O.:240] Out: -   Examination: General exam: AAOx3, weak, old for agem NAD, weak appearing. HEENT:Oral mucosa moist, Ear/Nose WNL grossly, dentition normal. Respiratory system: bilaterally clear,no wheezing or crackles,no use of accessory muscle Cardiovascular system: S1 & S2 +, No JVD,. Gastrointestinal system: Abdomen soft, NT,ND, BS+ Nervous System:Alert, awake, moving extremities and grossly nonfocal Extremities: No edema, distal peripheral pulses palpable.  Skin: No rashes,no icterus. MSK: Normal muscle bulk,tone, power  Data Reviewed: I have personally reviewed labs and imaging studies CBC: Recent Labs  Lab 07/14/20 0626 07/16/20 0543 07/18/20 0435 07/20/20 0422  WBC  --  17.4* 15.0* 14.4*  HGB 9.7* 10.4* 10.4* 10.7*  HCT 31.9* 33.3* 33.0* 34.7*  MCV  --  94.9 95.7 96.7  PLT  --  422* 387 454   Basic Metabolic Panel: Recent Labs  Lab 07/16/20 0543 07/18/20 0435 07/20/20 0422  NA 134* 137 136  K 4.5 4.4 4.3  CL 100 102 100  CO2 25 27 28   GLUCOSE 305* 149* 158*  BUN 33* 34* 40*  CREATININE 0.90 0.96 0.97  CALCIUM 9.6 9.5 9.6  MG 2.0  --   --   PHOS 2.8  --   --    GFR: Estimated Creatinine Clearance: 76.9 mL/min (by C-G formula based on SCr of 0.97 mg/dL). Liver Function Tests: Recent Labs  Lab 07/16/20 0543  ALBUMIN 2.9*   No results for input(s): LIPASE, AMYLASE in the last 168 hours. No results for input(s): AMMONIA in the last 168 hours. Coagulation Profile: Recent Labs  Lab 07/16/20 0543 07/17/20 0525 07/18/20 0435 07/19/20 0447 07/20/20 0422  INR 2.9* 2.8* 2.7* 2.7* 2.7*   Cardiac Enzymes: No results for  input(s): CKTOTAL, CKMB, CKMBINDEX, TROPONINI in the last 168 hours. BNP (last 3 results) No results for input(s): PROBNP in the last 8760 hours. HbA1C: No results for input(s): HGBA1C in the last 72 hours. CBG: Recent Labs  Lab 07/19/20 1406 07/19/20 1656 07/19/20 2101 07/20/20 0732 07/20/20 1229  GLUCAP 140* 171* 93 168* 439*   Lipid Profile: No results for input(s): CHOL, HDL, LDLCALC, TRIG, CHOLHDL, LDLDIRECT in the last 72 hours. Thyroid Function Tests: No results for input(s): TSH, T4TOTAL, FREET4, T3FREE, THYROIDAB  in the last 72 hours. Anemia Panel: No results for input(s): VITAMINB12, FOLATE, FERRITIN, TIBC, IRON, RETICCTPCT in the last 72 hours. Sepsis Labs: No results for input(s): PROCALCITON, LATICACIDVEN in the last 168 hours.  No results found for this or any previous visit (from the past 240 hour(s)).   Radiology Studies: MR BRAIN W WO CONTRAST  Result Date: 07/20/2020 CLINICAL DATA:  Small cell lung cancer. Brain metastases. Reassess. EXAM: MRI HEAD WITHOUT AND WITH CONTRAST TECHNIQUE: Multiplanar, multiecho pulse sequences of the brain and surrounding structures were obtained without and with intravenous contrast. CONTRAST:  6.21mL GADAVIST GADOBUTROL 1 MMOL/ML IV SOLN COMPARISON:  Head CT 07/10/2020. Multiple other CT studies as distant as April 2016. FINDINGS: Brain: Diffusion imaging does not show any acute or subacute infarction. There is old infarction of the inferior division right MCA on the right affecting the insula and frontal operculum. This has progressed to atrophy and encephalomalacia. Elsewhere, cerebral hemispheres do not show any ischemic insults. Old small vessel infarction left cerebellum no hydrocephalus or extra-axial collection. Resolution of the previously seen right frontal metastasis. No evidence of residual disease in that location. Newly seen abnormal enhancement affecting the ventral surface the pons the level of the pontomedullary junction to  the mid brain. This is an unusual appearance and is presumed to be subsequent to metastatic disease. Some potential this could relate to treatment effect higher doses were employed in this region, but I do not know why that would have been the case. Vascular: Major vessels at the base of the brain show flow. Skull and upper cervical spine: Negative Sinuses/Orbits: Clear/normal Other: None IMPRESSION: Newly seen abnormal enhancement affecting the ventral pons from the pontomedullary junction to the mid brain. This is presumed represent metastatic disease. The possibility of treatment effect is considered but felt unlikely. Resolution of the previously seen right frontal metastasis. Old right MCA territory infarction. Electronically Signed   By: Nelson Chimes M.D.   On: 07/20/2020 13:11     LOS: 11 days   Antonieta Pert, MD [facility]  07/20/2020, 1:22 PM  In conjunction.

## 2020-07-20 NOTE — Progress Notes (Signed)
Palliative Medicine Team consult was received.   I met briefly with Mr. Mantz shortly after he arrived back to the floor from having MRI completed.    I introduced myself and concept of palliative care with goal of assisting with symptom management and decisions moving forward for his medical care.  He was polite and open to conversation, but he would like to have results of MRI available prior to any discussions regarding goals moving forward.  This certainly is reasonable.  I will plan to follow-up this weekend (plan for tomorrow) for initial consult.   If there are urgent needs or questions please call [phone]. Thank you for consulting out team to assist with this patients care.  Micheline Rough, MD [place] [facility] Team [phone]  NO CHARGE NOTE

## 2020-07-20 NOTE — Progress Notes (Signed)
Patient is undergoing his MRI. ICD will be programmed back to original settings when the study is complete. Please re-consult Cardiology for additional questions over the weekend.

## 2020-07-20 NOTE — Progress Notes (Signed)
Inpatient Diabetes Program Recommendations  AACE/ADA: New Consensus Statement on Inpatient Glycemic Control (2015)  Target Ranges:  Prepandial:   less than 140 mg/dL      Peak postprandial:   less than 180 mg/dL (1-2 hours)      Critically ill patients:  140 - 180 mg/dL   Lab Results  Component Value Date   GLUCAP 439 (H) 07/20/2020   HGBA1C 10.5 (H) 07/08/2020    Review of Glycemic Control  Results for Dennis [name], Dennis [name] (MRN [id number]) as of 07/20/2020 13:12  Ref. Range 07/19/2020 07:52 07/19/2020 12:04 07/19/2020 14:06 07/19/2020 16:56 07/19/2020 21:01 07/20/2020 07:32 07/20/2020 12:29  Glucose-Capillary Latest Ref Range: 70 - 99 mg/dL 166 (H) 181 (H) 140 (H) 171 (H) 93 168 (H) 439 (H)   Inpatient Diabetes Program Recommendations:    Pt was given insulin late due to MRI procedure, no adjustments to regimen at this time. Watch trends.   Thanks,  Tama Headings RN, MSN, BC-ADM Inpatient Diabetes Coordinator Team Pager [phone] (8a-5p)

## 2020-07-21 DIAGNOSIS — R2681 Unsteadiness on feet: Secondary | ICD-10-CM | POA: Diagnosis not present

## 2020-07-21 DIAGNOSIS — C7931 Secondary malignant neoplasm of brain: Secondary | ICD-10-CM | POA: Diagnosis not present

## 2020-07-21 DIAGNOSIS — Z515 Encounter for palliative care: Secondary | ICD-10-CM | POA: Diagnosis not present

## 2020-07-21 DIAGNOSIS — C3490 Malignant neoplasm of unspecified part of unspecified bronchus or lung: Secondary | ICD-10-CM | POA: Diagnosis not present

## 2020-07-21 DIAGNOSIS — Z7189 Other specified counseling: Secondary | ICD-10-CM

## 2020-07-21 LAB — CBC
HCT: 33.2 % — ABNORMAL LOW (ref 39.0–52.0)
Hemoglobin: 10.4 g/dL — ABNORMAL LOW (ref 13.0–17.0)
MCH: 30.2 pg (ref 26.0–34.0)
MCHC: 31.3 g/dL (ref 30.0–36.0)
MCV: 96.5 fL (ref 80.0–100.0)
Platelets: 325 10*3/uL (ref 150–400)
RBC: 3.44 MIL/uL — ABNORMAL LOW (ref 4.22–5.81)
RDW: 15.5 % (ref 11.5–15.5)
WBC: 16.8 10*3/uL — ABNORMAL HIGH (ref 4.0–10.5)
nRBC: 0 % (ref 0.0–0.2)

## 2020-07-21 LAB — BASIC METABOLIC PANEL
Anion gap: 9 (ref 5–15)
BUN: 42 mg/dL — ABNORMAL HIGH (ref 6–20)
CO2: 28 mmol/L (ref 22–32)
Calcium: 9.8 mg/dL (ref 8.9–10.3)
Chloride: 99 mmol/L (ref 98–111)
Creatinine, Ser: 0.94 mg/dL (ref 0.61–1.24)
GFR, Estimated: >60 mL/min (ref >60)
Glucose, Bld: 158 mg/dL — ABNORMAL HIGH (ref 70–99)
Potassium: 4.4 mmol/L (ref 3.5–5.1)
Sodium: 136 mmol/L (ref 135–145)

## 2020-07-21 LAB — GLUCOSE, CAPILLARY
Glucose-Capillary: 151 mg/dL — ABNORMAL HIGH (ref 70–99)
Glucose-Capillary: 208 mg/dL — ABNORMAL HIGH (ref 70–99)
Glucose-Capillary: 117 mg/dL — ABNORMAL HIGH (ref 70–99)
Glucose-Capillary: 62 mg/dL — ABNORMAL LOW (ref 70–99)
Glucose-Capillary: 146 mg/dL — ABNORMAL HIGH (ref 70–99)

## 2020-07-21 LAB — PROTIME-INR
INR: 3.1 — ABNORMAL HIGH (ref 0.8–1.2)
Prothrombin Time: 30.9 seconds — ABNORMAL HIGH (ref 11.4–15.2)

## 2020-07-21 MED ORDER — WARFARIN SODIUM 1 MG PO TABS
1.0000 mg | ORAL_TABLET | Freq: Once | ORAL | Status: AC
  Administered 2020-07-21: 1 mg via ORAL
  Filled 2020-07-21: qty 1

## 2020-07-21 NOTE — Progress Notes (Signed)
CRITICAL VALUE ALERT  Critical Value:  Blood glucose of 62  Date & Time Notied:10/9 @ 5749  Provider Notified: Antonieta Pert  Orders Received/Actions taken:   JUice given, sugar rose to 117.

## 2020-07-21 NOTE — Progress Notes (Signed)
Physical Therapy Treatment Patient Details Name: Dennis [name]. MRN: [id number] DOB: [birth year] Today's Date: 07/21/2020    History of Present Illness 59 yo male admitted with unsteady gait, slurred speech. Hx of CHF, DM, CVA, pacemaker, MDD, metastatic lung ca, polysubstance abuse    PT Comments    Pt agreeable to work with PT as always. Per chart review, pt had another fall on yesterday while on BSC. See vitals section for orthostatic BP readings during this session. Pt denied symptoms but he was diaphoretic with mobility. Pt is still experiencing orthostatic hypotension. TED hose and ABD binder donned prior to standing. Unable to safely attempt ambulation-+2 assist not available. BP 99/78 once seated in recliner at end of session. Made Rn aware pt was in recliner. May need to consider WC to allow pt to safely mobilize in home.    Follow Up Recommendations  Home health PT;Supervision/Assistance - 24 hour     Equipment Recommendations  Wheelchair    Recommendations for Other Services       Precautions / Restrictions Precautions Precautions: Fall Precaution Comments: ABD binder and TED hose for BP Restrictions Weight Bearing Restrictions: No    Mobility  Bed Mobility Overal bed mobility: Needs Assistance Bed Mobility: Supine to Sit;Sit to Supine     Supine to sit: Supervision Sit to supine: Supervision   General bed mobility comments: cues for increased time. on 1st supine>sit: pt became less responsive so returned to sidelying for ~1 minute. On 2nd side>sit: pt ok, denied symptoms.  Transfers Overall transfer level: Needs assistance Equipment used: Rolling walker (2 wheeled) Transfers: Sit to/from Stand Sit to Stand: Min guard Stand pivot transfers: Min guard       General transfer comment: Sit to stand x 2. On 1st sit>stand: BP80/49. Pt denied symptoms. Therapist recommended pt return to sitting for ~1 minute or so. Pt continued to deny symptoms. Stood a 2nd time  then pivoted to recliner. Pt mildly diaphoretic.  Ambulation/Gait             General Gait Details: Deferred-no +2 assist available for safety.   Stairs             Wheelchair Mobility    Modified Rankin (Stroke Patients Only)       Balance Overall balance assessment: Needs assistance;History of Falls           Standing balance-Leahy Scale: Fair                              Cognition Arousal/Alertness: Awake/alert Behavior During Therapy: WFL for tasks assessed/performed Overall Cognitive Status: Within Functional Limits for tasks assessed                                        Exercises      General Comments        Pertinent Vitals/Pain Pain Assessment: No/denies pain    Home Living                      Prior Function            PT Goals (current goals can now be found in the care plan section) Progress towards PT goals: Progressing toward goals (albeit slowly due to orthostatic BP)    Frequency    Min 3X/week      PT  Plan Current plan remains appropriate    Co-evaluation              AM-PAC PT "6 Clicks" Mobility   Outcome Measure  Help needed turning from your back to your side while in a flat bed without using bedrails?: None Help needed moving from lying on your back to sitting on the side of a flat bed without using bedrails?: None Help needed moving to and from a bed to a chair (including a wheelchair)?: A Little Help needed standing up from a chair using your arms (e.g., wheelchair or bedside chair)?: A Little Help needed to walk in hospital room?: A Little Help needed climbing 3-5 steps with a railing? : A Lot 6 Click Score: 19    End of Session Equipment Utilized During Treatment: Gait belt Activity Tolerance: Patient tolerated treatment well (but still with (+) orthostatics) Patient left: in chair;with call bell/phone within reach;with chair alarm set   PT Visit Diagnosis:  Difficulty in walking, not elsewhere classified (R26.2)     Time: 5800-6349 PT Time Calculation (min) (ACUTE ONLY): 26 min  Charges:  $Gait Training: 23-37 mins                         Doreatha Massed, PT Acute Rehabilitation  Office: [phone] Pager: [phone]

## 2020-07-21 NOTE — Progress Notes (Signed)
PROGRESS NOTE    Dennis [name].  WUX:[id number] DOB: Apr 10, 1961 DOA: 07/07/2020 PCP: [name], MD   Chief Complaint  Patient presents with  . 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  CT head without contrast without acute finding.  Neurology consulted.  Could not do MRI due to ICD.  Repeat CT head with contrast did not reveal CVA but concern about infiltrating enhancement in ventral pons and proximal medulla concerning of metastatic disease.  EEG without epileptiform or seizure activity.  Patient with significant orthostatic hypotension even after holding cardiac meds, starting midodrine, TED hose and abdominal binder.  Initially there was some vasovagal element to his orthostasis but vasovagal issues seem to have resolved.  Echo with EF of 25 to 30% (previously 15 to 20%), diffuse hypokinesis G2-DD.  Cardiology following.  Radiation oncology consulted by oncology, and hoping to do MRI brain for better info about infiltrating enhancement in midbrain. However, patient has AICD which is " MRI conditional" and " not FDA approved". Cardiology willing to turn off AICD if radiology is willing to do MRI brain. Radiation oncology may order special CT with thin cuts .  Based on location MRI has been recommended operatively with meeting by his radiation oncologist Dr Mauri Reading. Patient underwent MRI of the brain after turning pacer off- "Newly seen abnormal enhancement affecting the ventral pons from the pontomedullary junction to the mid brain. This is presumed represent metastatic disease"   Subjective: Seen this morning.  Was sitting in the bedside chair.  Blood pressure dropped up to 80s when sitting up, almost passed out, but patient  reports he is okay. Denies nausea vomiting or chest pain.  Assessment & Plan:  Slurred speech/LUE paresthesia/unsteady gait in patient with prior right MCA CVA/acute metabolic encephalopathy on admission- CT head with and without contrast did not reveal CVA but possible pontine and medullary infiltrating lesion concerning for metastatic disease-underwent MRI of the brain after turning a pacemaker 10/8 that shows abnormal enhancement affecting the likely points from the pontomedullary junction of the middle.  Presumed metastatic disease. Echo EF of 25 to 30% (improved). EEG no seizure activity, UDS positive for cocaine.  INR therapeutic is being dosed by pharmacy, but subtherapeutic on admission. HBA1c 10.5%.  LDL 77.  Neurologically intact.  Continue aspirin.  Overall neurologically intact, continue aspirin Coumadin PT OT.   Chronic systolic CHF status post AICD March/2021: Appears euvolemic unable to use diuretics or chf meds-due to his orthostatic hypotension  Recurrent syncope/orthostatic hypotension: Appears to be improving initially concerned for vasovagal. He reports some hesitancy and sense of incomplete void for months.  Never seen urology.  Also concern about autonomic dysfunction in the setting of his brain mets.TTE as above EF 25 to 30%. Cortisol low which is expected while on dexamethasone.  TSH normal.Cardiology following-cardiac meds on hold.  Continue midodrine 10 mg 3 times daily, TED hose and abdominal binder.  Continues to be orthostatic hypotensive.continue PT OT.  He plans to go to his mother's house and brother will help him.  He was living in the apartment prior to admission.   Small cell lung cancer with brain mets status post whole brain radiation:CT head with and without contrast concerning for pontine and medullary infiltrates so on  Decadron 4 mg every 12 hours per oncology .Discussed with Dr Mickeal Skinner and recommend MRI brain with and without contrast, and cardiology has turned off  pacer and had MRI 10/8- shows:"Newly seen abnormal enhancement affecting the ventral pons from the pontomedullary junction to the mid brain. This is presumed represent metastatic disease. The possibility of treatment effect is considered but felt unlikely. Resolution of the previously seen right frontal metastasis. Old right MCA territory infarction" Spoke w/ Dr Mickeal Skinner and likely tumor, he will let radio onc know for possible fractionated radiosurgery ( likely outpatient, tumor board meeting monday).  He will arrange outpatient follow-up upon discharge. He agreed with palliative care evaluation while he is here  Essential hypertension:On midodrine.  CAD history occluded LAD with collaterals: No chest pain.  Continue aspirin and statin and Coumadin.   History of Rt MCA CVA/cardiomyopathy on Coumadin per cardiology  HLD: On statin  Cocaine abuse: Cessation advised.  He is not forthcoming about his drug use.  Uncontrolled type 2 diabetes mellitus with hyperglycemia, with long-term current use of insulin. hba1c 10.5,Cont on Lantus of 28 units twice daily, Premeal insulin 8 U and sliding scale insulin at this time, blood sugar overall stable continues to remain intermittently labile. Recent Labs  Lab 07/20/20 1706 07/20/20 1930 07/20/20 2127 07/21/20 0725 07/21/20 1126  GLUCAP 162* 74 102* 151* 208*   Lethargy/possible delirium-he is alert and oriented x3.    Leukocytosis likely from steroid , is up today,but no fever  Recent Labs  Lab 07/16/20 0543 07/18/20 0435 07/20/20 0422 07/21/20 0550  WBC 17.4* 15.0* 14.4* 16.8*   Debility/deconditioning/failure to thrive: He will benefit with palliative care consultation.  Palliative care has been consulted.   Anemia of chronic disease: Stable. Recent Labs  Lab 07/16/20 0543 07/18/20 0435 07/20/20 0422 07/21/20 0550  HGB 10.4* 10.4* 10.7* 10.4*  HCT 33.3* 33.0* 34.7* 33.2*    Nutrition: Diet Order            Diet Carb Modified  Fluid consistency: Thin; Room service appropriate? Yes with Assist  Diet effective now                 Nutrition Problem: Increased nutrient needs Etiology: cancer and cancer related treatments (CHF, substance abuse) Signs/Symptoms: estimated needs Interventions: Ensure Enlive (each supplement provides 350kcal and 20 grams of protein)  Body mass index is 19.6 kg/m.  DVT prophylaxis: Place TED hose Start: 07/10/20 1054 SCD's Start: 07/08/20 0127 Code Status:   Code Status: Full Code  Family Communication: plan of care discussed with patient at bedside.  Status is: Inpatient Remains inpatient appropriate because:Inpatient level of care appropriate due to severity of illness  Dispo: The patient is from: Home              Anticipated d/c is to: Home w/ [facility]              Anticipated d/c date is: Anticipate discharge tomorrow after palliative evaluation.  Of note patient remains orthostatic. He is on Coumadin for his cardiac conditions.  Will need outpatient follow-up with rad/onc and overall prognosis remains guarded. Patient currently is not medically stable to d/c. Consultants:see note  Procedures:see note  Culture/Microbiology    Component Value Date/Time   SDES URINE, CLEAN CATCH 01/30/2015 1650   SPECREQUEST NONE 01/30/2015 1650   CULT NO GROWTH Performed at [facility]  01/30/2015 1650   REPTSTATUS 02/01/2015 FINAL 01/30/2015 1650    Other culture-see note  Medications: Scheduled Meds: . aspirin  EC  81 mg Oral Daily  . atorvastatin  20 mg Oral Daily  . dexamethasone  4 mg Oral Q12H  . feeding supplement (ENSURE ENLIVE)  237 mL Oral BID BM  . insulin aspart  0-20 Units Subcutaneous TID WC  . insulin aspart  0-5 Units Subcutaneous QHS  . insulin aspart  8 Units Subcutaneous TID WC  . insulin glargine  28 Units Subcutaneous BID  . midodrine  10 mg Oral TID WC  . warfarin  1 mg Oral ONCE-1600  . Warfarin - Pharmacist Dosing Inpatient   Does not apply q1600    Continuous Infusions:  Antimicrobials: Anti-infectives (From admission, onward)   None     Objective: Vitals: Today's Vitals   07/21/20 0338 07/21/20 0727 07/21/20 0900 07/21/20 1325  BP: 132/87 (!) 136/94  (!) 144/98  Pulse: 88 88  92  Resp: 16 16  20   Temp: 98.2 F (36.8 C) 98.4 F (36.9 C)  97.6 F (36.4 C)  TempSrc: Oral Oral  Oral  SpO2: 100% 100%  100%  Weight:      Height:      PainSc:   0-No pain     Intake/Output Summary (Last 24 hours) at 07/21/2020 1404 Last data filed at 07/21/2020 0900 Gross per 24 hour  Intake 240 ml  Output 1775 ml  Net -1535 ml   Filed Weights   07/16/20 1225 07/17/20 0532 07/18/20 0503  Weight: 67 kg 69.1 kg 65.5 kg   Weight change:   Intake/Output from previous day: 10/08 0701 - 10/09 0700 In: 540 [P.O.:540] Out: 1575 [Urine:1575] Intake/Output this shift: Total I/O In: 240 [P.O.:240] Out: 200 [Urine:200]  Examination: General exam: AAOx3 , NAD, weak appearing. HEENT:Oral mucosa moist, Ear/Nose WNL grossly, dentition normal. Respiratory system: bilaterally clear,no wheezing or crackles,no use of accessory muscle Cardiovascular system: S1 & S2 +, No JVD,. Gastrointestinal system: Abdomen soft, NT,ND, BS+ Nervous System:Alert, awake, moving extremities and grossly nonfocal Extremities: No edema, distal peripheral pulses palpable.  Skin: No rashes,no icterus. MSK: Normal muscle bulk,tone, power  Data Reviewed: I have personally reviewed labs and imaging studies CBC: Recent Labs  Lab 07/16/20 0543 07/18/20 0435 07/20/20 0422 07/21/20 0550  WBC 17.4* 15.0* 14.4* 16.8*  HGB 10.4* 10.4* 10.7* 10.4*  HCT 33.3* 33.0* 34.7* 33.2*  MCV 94.9 95.7 96.7 96.5  PLT 422* 387 318 741   Basic Metabolic Panel: Recent Labs  Lab 07/16/20 0543 07/18/20 0435 07/20/20 0422 07/21/20 0550  NA 134* 137 136 136  K 4.5 4.4 4.3 4.4  CL 100 102 100 99  CO2 25 27 28 28   GLUCOSE 305* 149* 158* 158*  BUN 33* 34* 40* 42*  CREATININE  0.90 0.96 0.97 0.94  CALCIUM 9.6 9.5 9.6 9.8  MG 2.0  --   --   --   PHOS 2.8  --   --   --    GFR: Estimated Creatinine Clearance: 79.4 mL/min (by C-G formula based on SCr of 0.94 mg/dL). Liver Function Tests: Recent Labs  Lab 07/16/20 0543  ALBUMIN 2.9*   No results for input(s): LIPASE, AMYLASE in the last 168 hours. No results for input(s): AMMONIA in the last 168 hours. Coagulation Profile: Recent Labs  Lab 07/17/20 0525 07/18/20 0435 07/19/20 0447 07/20/20 0422 07/21/20 0550  INR 2.8* 2.7* 2.7* 2.7* 3.1*   Cardiac Enzymes: No results for input(s): CKTOTAL, CKMB, CKMBINDEX, TROPONINI in the last 168 hours. BNP (last 3 results) No results for input(s): PROBNP in the last  8760 hours. HbA1C: No results for input(s): HGBA1C in the last 72 hours. CBG: Recent Labs  Lab 07/20/20 1706 07/20/20 1930 07/20/20 2127 07/21/20 0725 07/21/20 1126  GLUCAP 162* 74 102* 151* 208*   Lipid Profile: No results for input(s): CHOL, HDL, LDLCALC, TRIG, CHOLHDL, LDLDIRECT in the last 72 hours. Thyroid Function Tests: No results for input(s): TSH, T4TOTAL, FREET4, T3FREE, THYROIDAB in the last 72 hours. Anemia Panel: No results for input(s): VITAMINB12, FOLATE, FERRITIN, TIBC, IRON, RETICCTPCT in the last 72 hours. Sepsis Labs: No results for input(s): PROCALCITON, LATICACIDVEN in the last 168 hours.  No results found for this or any previous visit (from the past 240 hour(s)).   Radiology Studies: MR BRAIN W WO CONTRAST  Result Date: 07/20/2020 CLINICAL DATA:  Small cell lung cancer. Brain metastases. Reassess. EXAM: MRI HEAD WITHOUT AND WITH CONTRAST TECHNIQUE: Multiplanar, multiecho pulse sequences of the brain and surrounding structures were obtained without and with intravenous contrast. CONTRAST:  6.47mL GADAVIST GADOBUTROL 1 MMOL/ML IV SOLN COMPARISON:  Head CT 07/10/2020. Multiple other CT studies as distant as April 2016. FINDINGS: Brain: Diffusion imaging does not show any  acute or subacute infarction. There is old infarction of the inferior division right MCA on the right affecting the insula and frontal operculum. This has progressed to atrophy and encephalomalacia. Elsewhere, cerebral hemispheres do not show any ischemic insults. Old small vessel infarction left cerebellum no hydrocephalus or extra-axial collection. Resolution of the previously seen right frontal metastasis. No evidence of residual disease in that location. Newly seen abnormal enhancement affecting the ventral surface the pons the level of the pontomedullary junction to the mid brain. This is an unusual appearance and is presumed to be subsequent to metastatic disease. Some potential this could relate to treatment effect higher doses were employed in this region, but I do not know why that would have been the case. Vascular: Major vessels at the base of the brain show flow. Skull and upper cervical spine: Negative Sinuses/Orbits: Clear/normal Other: None IMPRESSION: Newly seen abnormal enhancement affecting the ventral pons from the pontomedullary junction to the mid brain. This is presumed represent metastatic disease. The possibility of treatment effect is considered but felt unlikely. Resolution of the previously seen right frontal metastasis. Old right MCA territory infarction. Electronically Signed   By: Nelson Chimes M.D.   On: 07/20/2020 13:11     LOS: 12 days   Antonieta Pert, MD [facility]  07/21/2020, 2:04 PM  In conjunction.

## 2020-07-21 NOTE — Progress Notes (Signed)
ANTICOAGULATION CONSULT NOTE  Pharmacy Consult for Warfarin Indication: advanced (noncompaction) cardiomyopathy  No Known Allergies  Patient Measurements: Height: 6' (182.9 cm) Weight: 65.5 kg (144 lb 8 oz) IBW/kg (Calculated) : 77.6  Vital Signs: Temp: 98.4 F (36.9 C) (10/09 0727) Temp Source: Oral (10/09 0727) BP: 136/94 (10/09 0727) Pulse Rate: 88 (10/09 0727)  Labs: Recent Labs    07/19/20 0447 07/20/20 0422 07/21/20 0550  HGB  --  10.7* 10.4*  HCT  --  34.7* 33.2*  PLT  --  318 325  LABPROT 27.8* 27.7* 30.9*  INR 2.7* 2.7* 3.1*  CREATININE  --  0.97 0.94    Estimated Creatinine Clearance: 79.4 mL/min (by C-G formula based on SCr of 0.94 mg/dL).  Medications:  Scheduled:  . aspirin EC  81 mg Oral Daily  . atorvastatin  20 mg Oral Daily  . dexamethasone  4 mg Oral Q12H  . feeding supplement (ENSURE ENLIVE)  237 mL Oral BID BM  . insulin aspart  0-20 Units Subcutaneous TID WC  . insulin aspart  0-5 Units Subcutaneous QHS  . insulin aspart  8 Units Subcutaneous TID WC  . insulin glargine  28 Units Subcutaneous BID  . midodrine  10 mg Oral TID WC  . 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 Per med rec and chart records on visit on 9/3, pt is on Warfarin 7.5 mg On Tues/Thur/Sat and 3.75 mg on all other days. Baseline INR subtherapeutic on admission; possible noncompliance.  Today, 07/21/20   INR just above goal INR range at 3.1  Hgb and Plts stable  CT head negative for hemorrhage  No major drug-drug interactions with warfarin; continues on concomitant ASA from PTA  Goal of Therapy:  INR 2-3 Monitor platelets by anticoagulation protocol: Yes   Plan:   Warfarin 1mg  today  Daily INR   For discharge, recommend resuming PTA dosing with INR check in 3-5 days  Monitor for signs and symptoms of  bleeding   Adrian Saran, PharmD, BCPS 07/21/2020 9:34 AM

## 2020-07-21 NOTE — Consult Note (Addendum)
Palliative Care Progress Note  Reason for consult: Goals of care in light of metastatic lung cancer  Palliative care consult received.  Chart reviewed including personal review of pertinent labs and imaging.  I met today with Dennis [name].   Briefly, he is a 59 year old male with past medical history of MCA CVA, small cell lung cancer with brain mets status post whole brain radiation, chronic CHF status post ICD, IDDM who presented with unsteady gait, slurred speech and left upper extremity numbness.  He was admitted for concern of acute on chronic CVA.  CT head did not show any acute finding.  Due to ICD, it took time to have MRI completed but following completion of this it was documented that there was a "Newly seen abnormal enhancement affecting the ventral pons from the pontomedullary junction to the midbrain.  This is presumed represent metastatic disease."  Following this discovery, consultation with Dr. Mickeal Skinner (neurooncologist) resulted in recommendation for presentation at tumor board next week to discuss potential benefits of further radiation.  Additionally, he continues to suffer from periods of orthostatic hypotension and syncope.  He reports having a fall yesterday.  Palliative consulted for goals of care.  I introduced palliative care as specialized medical care for people living with serious illness. It focuses on providing relief from the symptoms and stress of a serious illness. The goal is to improve quality of life for both the patient and the family.  We discussed clinical course as well as wishes moving forward in regard to advanced directives.  Concepts specific to code status and rehospitalization discussed.  We discussed difference between a aggressive medical intervention path and a palliative, comfort focused care path.  Values and goals of care important to patient and family were attempted to be elicited.  He reports that he has a daughter, Dennis [name], who just turned 58  years old in September.  We discussed legal decision making in [place] and that based upon this, his legal surrogate at this point would be his mother.  He expressed understanding.  He does have paperwork for naming healthcare power of attorney at the bedside.  He does not have his glasses with him here in the hospital and wants to be able to review it personally prior to completion of any documents.  We also discussed that the goal of any medical care being to add time in quality outside of the hospital.  He is invested in plan for continuation of aggressive interventions for potential treatment of his cancer.  He really wants to know recommendations from radiation oncology after they have tumor board meeting on Monday.  He does not think he can make further decisions regarding long-term goals until he has better understanding of these recommendations.    We discussed that, in light of his current illness, we should focus on interventions that are likely to result in him getting well enough to get back out of the hospital and spend meaningful time with family.  We talked about a MOST form and utilizing this to outline his wishes so they are documented and known.  We discussed heroic interventions the end of life and he reports that he would not want to be on life support.  At the same time, however, he reports needing time to discuss this with his family prior to changing CODE STATUS or placing any limitations on his care plan.  I gave him a MOST form to review and we discussed recommendation to complete 1 in order  to have his wishes outlined.  Questions and concerns addressed.   PMT will continue to support holistically.  Recommendations: - Full code/full scope for now.  He would like to speak with his family about options prior to making changes, but does state that he would not want to be kept alive by artificial means or life support.  I gave him a MOST form to review, but he does not have his  glasses with him at the hospital.  Encouraged consideration for DNR/DNI based upon experience with outcomes in patients facing similar situation to his. - He would like to eventually name his brother as his HCPOA.  He understands that, at this time, his mother would be his legal surrogate.  He also indicated he would want his daughter, Dennis [name], to be part of decision making.  At this time, however, is a minor and will be until next September.  He has advance directive paperwork at the bedside, but again, he wants to take it home to review with his family (and where he has his glasses) prior to completion. - He would prefer to go home at time of discharge. - Discussed risk vs benefit of anticoagulation in light of his lightheadedness and falls.  He understands and is in agreement with plan to continue with anticoagulation. - He is concerned about potential discharge tomorrow as he worries about falls and also wants to know a plan from oncology (plan is for presentation of his case at tumor board on Monday).  He reports that he would like to be feeling better from a lightheadedness standpoint (I let him know that, unfortunately, this may never go away regardless of how long he is hospitalized) and know recommendations from oncology prior to being discharged home.  I let him know that plan for discharge would be determined by medical readiness as assessed by Dr. Lupita Leash.  He expressed understanding.    Start time: 1745 End time: 1905 Total time: 80 minutes  Greater than 50%  of this time was spent counseling and coordinating care related to the above assessment and plan.  Micheline Rough, MD [place] [facility] [phone]

## 2020-07-21 NOTE — Plan of Care (Signed)
  Problem: Education: Goal: Knowledge of General Education information will improve Description: Including pain rating scale, medication(s)/side effects and non-pharmacologic comfort measures Outcome: Progressing   Problem: Health Behavior/Discharge Planning: Goal: Ability to manage health-related needs will improve Outcome: Progressing   Problem: Clinical Measurements: Goal: Ability to maintain clinical measurements within normal limits will improve Outcome: Progressing Goal: Will remain free from infection Outcome: Progressing Goal: Diagnostic test results will improve Outcome: Progressing Goal: Cardiovascular complication will be avoided Outcome: Progressing   Problem: Activity: Goal: Risk for activity intolerance will decrease Outcome: Progressing   Problem: Nutrition: Goal: Adequate nutrition will be maintained Outcome: Progressing   Problem: Elimination: Goal: Will not experience complications related to bowel motility Outcome: Progressing Goal: Will not experience complications related to urinary retention Outcome: Progressing   Problem: Safety: Goal: Ability to remain free from injury will improve Outcome: Progressing   Problem: Skin Integrity: Goal: Risk for impaired skin integrity will decrease Outcome: Progressing

## 2020-07-22 DIAGNOSIS — Z95810 Presence of automatic (implantable) cardiac defibrillator: Secondary | ICD-10-CM | POA: Diagnosis not present

## 2020-07-22 DIAGNOSIS — I10 Essential (primary) hypertension: Secondary | ICD-10-CM | POA: Diagnosis not present

## 2020-07-22 DIAGNOSIS — I5022 Chronic systolic (congestive) heart failure: Secondary | ICD-10-CM | POA: Diagnosis not present

## 2020-07-22 DIAGNOSIS — R2681 Unsteadiness on feet: Secondary | ICD-10-CM | POA: Diagnosis not present

## 2020-07-22 LAB — CBC
HCT: 31.8 % — ABNORMAL LOW (ref 39.0–52.0)
Hemoglobin: 10.1 g/dL — ABNORMAL LOW (ref 13.0–17.0)
MCH: 30.7 pg (ref 26.0–34.0)
MCHC: 31.8 g/dL (ref 30.0–36.0)
MCV: 96.7 fL (ref 80.0–100.0)
Platelets: 309 10*3/uL (ref 150–400)
RBC: 3.29 MIL/uL — ABNORMAL LOW (ref 4.22–5.81)
RDW: 15.5 % (ref 11.5–15.5)
WBC: 15.4 10*3/uL — ABNORMAL HIGH (ref 4.0–10.5)
nRBC: 0 % (ref 0.0–0.2)

## 2020-07-22 LAB — PROTIME-INR
INR: 2.5 — ABNORMAL HIGH (ref 0.8–1.2)
Prothrombin Time: 26.3 seconds — ABNORMAL HIGH (ref 11.4–15.2)

## 2020-07-22 LAB — BASIC METABOLIC PANEL
Anion gap: 8 (ref 5–15)
BUN: 38 mg/dL — ABNORMAL HIGH (ref 6–20)
CO2: 27 mmol/L (ref 22–32)
Calcium: 9.4 mg/dL (ref 8.9–10.3)
Chloride: 100 mmol/L (ref 98–111)
Creatinine, Ser: 0.91 mg/dL (ref 0.61–1.24)
GFR, Estimated: >60 mL/min (ref >60)
Glucose, Bld: 151 mg/dL — ABNORMAL HIGH (ref 70–99)
Potassium: 4.5 mmol/L (ref 3.5–5.1)
Sodium: 135 mmol/L (ref 135–145)

## 2020-07-22 LAB — GLUCOSE, CAPILLARY
Glucose-Capillary: 160 mg/dL — ABNORMAL HIGH (ref 70–99)
Glucose-Capillary: 191 mg/dL — ABNORMAL HIGH (ref 70–99)
Glucose-Capillary: 208 mg/dL — ABNORMAL HIGH (ref 70–99)
Glucose-Capillary: 195 mg/dL — ABNORMAL HIGH (ref 70–99)

## 2020-07-22 MED ORDER — INSULIN GLARGINE 100 UNIT/ML ~~LOC~~ SOLN
25.0000 [IU] | Freq: Two times a day (BID) | SUBCUTANEOUS | Status: DC
  Administered 2020-07-22 – 2020-07-25 (×6): 25 [IU] via SUBCUTANEOUS
  Filled 2020-07-22 (×7): qty 0.25

## 2020-07-22 MED ORDER — WARFARIN SODIUM 3 MG PO TABS
3.0000 mg | ORAL_TABLET | Freq: Once | ORAL | Status: AC
  Administered 2020-07-22: 3 mg via ORAL
  Filled 2020-07-22: qty 1

## 2020-07-22 MED ORDER — INSULIN ASPART 100 UNIT/ML ~~LOC~~ SOLN
6.0000 [IU] | Freq: Three times a day (TID) | SUBCUTANEOUS | Status: DC
  Administered 2020-07-22 – 2020-07-25 (×10): 6 [IU] via SUBCUTANEOUS

## 2020-07-22 NOTE — Plan of Care (Signed)
  Problem: Education: Goal: Knowledge of General Education information will improve Description: Including pain rating scale, medication(s)/side effects and non-pharmacologic comfort measures Outcome: Progressing   Problem: Health Behavior/Discharge Planning: Goal: Ability to manage health-related needs will improve Outcome: Progressing   Problem: Clinical Measurements: Goal: Ability to maintain clinical measurements within normal limits will improve Outcome: Progressing Goal: Will remain free from infection Outcome: Progressing Goal: Diagnostic test results will improve Outcome: Progressing Goal: Cardiovascular complication will be avoided Outcome: Progressing   Problem: Activity: Goal: Risk for activity intolerance will decrease Outcome: Progressing   Problem: Nutrition: Goal: Adequate nutrition will be maintained Outcome: Progressing   Problem: Elimination: Goal: Will not experience complications related to bowel motility Outcome: Progressing Goal: Will not experience complications related to urinary retention Outcome: Progressing   Problem: Safety: Goal: Ability to remain free from injury will improve Outcome: Progressing   Problem: Skin Integrity: Goal: Risk for impaired skin integrity will decrease Outcome: Progressing

## 2020-07-22 NOTE — Progress Notes (Signed)
[place] for Warfarin Indication: advanced (noncompaction) cardiomyopathy  No Known Allergies  Patient Measurements: Height: 6' (182.9 cm) Weight: 65.5 kg (144 lb 8 oz) IBW/kg (Calculated) : 77.6  Vital Signs: Temp: 97.7 F (36.5 C) (10/10 0557) Temp Source: Oral (10/10 0557) BP: 146/95 (10/10 0557) Pulse Rate: 90 (10/10 0557)  Labs: Recent Labs    07/20/20 0422 07/20/20 0422 07/21/20 0550 07/22/20 0555  HGB 10.7*   < > 10.4* 10.1*  HCT 34.7*  --  33.2* 31.8*  PLT 318  --  325 309  LABPROT 27.7*  --  30.9* 26.3*  INR 2.7*  --  3.1* 2.5*  CREATININE 0.97  --  0.94 0.91   < > = values in this interval not displayed.    Estimated Creatinine Clearance: 82 mL/min (by C-G formula based on SCr of 0.91 mg/dL).  Medications:  Scheduled:  . atorvastatin  20 mg Oral Daily  . dexamethasone  4 mg Oral Q12H  . feeding supplement (ENSURE ENLIVE)  237 mL Oral BID BM  . insulin aspart  0-20 Units Subcutaneous TID WC  . insulin aspart  0-5 Units Subcutaneous QHS  . insulin aspart  8 Units Subcutaneous TID WC  . insulin glargine  28 Units Subcutaneous BID  . midodrine  10 mg Oral TID WC  . 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 Per med rec and chart records on visit on 9/3, pt is on Warfarin 7.5 mg On Tues/Thur/Sat and 3.75 mg on all other days. Baseline INR subtherapeutic on admission; possible noncompliance.  Today, 07/22/20   INR therapeutic  Hgb and Plts stable  CT head negative for hemorrhage  No major drug-drug interactions with warfarin; continues on concomitant ASA from PTA  Goal of Therapy:  INR 2-3 Monitor platelets by anticoagulation protocol: Yes   Plan:   Warfarin 3mg  today  Daily INR   For discharge, recommend resuming PTA dosing with INR check in 3-5  days  Monitor for signs and symptoms of bleeding   Adrian Saran, PharmD, BCPS 07/22/2020 10:45 AM

## 2020-07-22 NOTE — Progress Notes (Signed)
PROGRESS NOTE    Dennis [name].  VOZ:[id number] DOB: 1961-04-15 DOA: 07/07/2020 PCP: [name], MD   Chief Complaint  Patient presents with  . 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  CT head without contrast without acute finding.  Neurology consulted.  Could not do MRI due to ICD.  Repeat CT head with contrast did not reveal CVA but concern about infiltrating enhancement in ventral pons and proximal medulla concerning of metastatic disease.  EEG without epileptiform or seizure activity.  Patient with significant orthostatic hypotension even after holding cardiac meds, starting midodrine, TED hose and abdominal binder.  Initially there was some vasovagal element to his orthostasis but vasovagal issues seem to have resolved.  Echo with EF of 25 to 30% (previously 15 to 20%), diffuse hypokinesis G2-DD.  Cardiology following.  Radiation oncology consulted by oncology, and hoping to do MRI brain for better info about infiltrating enhancement in midbrain. However, patient has AICD which is " MRI conditional" and " not FDA approved". Cardiology willing to turn off AICD if radiology is willing to do MRI brain. Radiation oncology may order special CT with thin cuts .  Based on location MRI has been recommended operatively with meeting by his radiation oncologist Dr Mauri Reading. Patient underwent MRI of the brain after turning pacer off- "Newly seen abnormal enhancement affecting the ventral pons from the pontomedullary junction to the mid brain. This is presumed represent metastatic disease" Patient continues to have lightheadedness and near syncopal episode mental status changes  Subjective:  Got up today at the bedside commode, reports  some lightheadedness and dizziness. Concerned about going home.  He is planning to return to his mother's house.  Assessment & Plan:  Slurred speech/LUE paresthesia/unsteady gait in patient with prior right MCA CVA/acute metabolic encephalopathy on admission- CT head with and without contrast did not reveal CVA but possible pontine and medullary infiltrating lesion concerning for metastatic disease-underwent MRI of the brain after turning a pacemaker off 10/8 that shows abnormal enhancement affecting the ventral pons from the pontomedullary junction to the mid-brain-this is presumed to represent metastatic disease.  No acute stroke. Echo EF of 25 to 30% (improved). EEG no seizure activity, UDS positive for cocaine.HBA1c 10.5%.  LDL 77.  Neurologically intact.Continue aspirin.  Recurrent syncope/orthostatic hypotension: Syncope due to severe orthostasis.  Multiple episodes, and micturition syncope and orthostasis. Still remains symptomatic bp in 70s on standing.TTE as above EF 25 to 30%. Cortisol low which is expected while on dexamethasone.TSH normal. Discussed w/ cardiology-giving 500 ml bolus ns today. Continue midodrine 10 mg 3 tid, TED hose and abdominal binder.  Small cell lung cancer with brain mets status post whole brain radiation:CT head with and without contrast concerning for pontine and medullary infiltrates so on Decadron 4 mg every 12 hours per oncology .Discussed with Dr Mickeal Skinner and recommend MRI brain with and without contrast, and cardiology has turned off pacer and had MRI 10/8- shows:"Newly seen abnormal enhancement affecting the ventral pons from the pontomedullary junction to the mid brain. This is presumed represent metastatic disease. The possibility of treatment effect is considered but felt unlikely. Resolution of the previously seen right frontal metastasis. Old right MCA territory infarction"Spoke w/ Dr Mickeal Skinner and likely tumor, he will let  radio onc know for possible fractionated  radiosurgery ( likely outpatient, tumor board meeting monday).  He will arrange outpatient follow-up upon discharge.  Seen by palliative care patient would like to discuss with family  Chronic systolic CHF status post AICD March/2021: Not on guideline directed heart failure therapy due to his hypotension and syncope.  Per cardiology. CAD history occluded LAD with collaterals/HLD/HTN: No chest pain, continue aspirin and statin and Coumadin.  On midodrine due to hypotension.  History of Rt MCA CVA/cardiomyopathy on Coumadin per cardiology.  If he continues to be symptomatic w/ syncope-despite maximal therapy will need to reassess coumadin safety- will defer to cardiology.   Polysubstance abuse/Cocaine abuse: Cessation advised.He is not forthcoming about his drug use.  Uncontrolled type 2 diabetes mellitus with hyperglycemia, with long-term current use of insulin. hba1c 10.5,Cont on Lantus of 28 units twice daily, Premeal insulin 8 U -which was held 10/9 due to hypoglycemia sugar 62.  Increase Lantus to 25 units twice daily and Premeal insulin to 6 units.  He is on steroids continue conservative normal. Recent Labs  Lab 07/21/20 1126 07/21/20 1646 07/21/20 1719 07/21/20 2033 07/22/20 0747  GLUCAP 208* 62* 117* 146* 160*   Lethargy/possible delirium-he is alert and oriented x3.    Leukocytosis likely from steroid , is up today,but no fever  Recent Labs  Lab 07/16/20 0543 07/18/20 0435 07/20/20 0422 07/21/20 0550 07/22/20 0555  WBC 17.4* 15.0* 14.4* 16.8* 15.4*   Debility/deconditioning/failure to thrive: He will benefit with palliative care consultation.  Palliative care has been consulted.   Anemia of chronic disease: Stable. Recent Labs  Lab 07/16/20 0543 07/18/20 0435 07/20/20 0422 07/21/20 0550 07/22/20 0555  HGB 10.4* 10.4* 10.7* 10.4* 10.1*  HCT 33.3* 33.0* 34.7* 33.2* 31.8*    Nutrition: Diet Order            Diet Carb Modified Fluid consistency: Thin; Room service  appropriate? Yes with Assist  Diet effective now                 Nutrition Problem: Increased nutrient needs Etiology: cancer and cancer related treatments (CHF, substance abuse) Signs/Symptoms: estimated needs Interventions: Ensure Enlive (each supplement provides 350kcal and 20 grams of protein)  Body mass index is 19.6 kg/m.  DVT prophylaxis: Place TED hose Start: 07/10/20 1054 SCD's Start: 07/08/20 0127 Code Status:   Code Status: Full Code  Family Communication: plan of care discussed with patient at bedside.  Status is: Inpatient Remains inpatient appropriate because:Inpatient level of care appropriate due to severity of illness  Dispo: The patient is from: Home              Anticipated d/c is to: Home w/ [facility]              Anticipated d/c date is: Anticipate discharge tomorrow if orthostatic vitals medically stable and okay with cardiology.  Patient currently is not medically stable to d/c. Consultants:see note  Procedures:see note  Culture/Microbiology    Component Value Date/Time   SDES URINE, CLEAN CATCH 01/30/2015 1650   SPECREQUEST NONE 01/30/2015 1650   CULT NO GROWTH Performed at [facility]  01/30/2015 1650   REPTSTATUS 02/01/2015 FINAL 01/30/2015 1650    Other culture-see note  Medications: Scheduled Meds: . atorvastatin  20 mg Oral Daily  . dexamethasone  4 mg Oral Q12H  . feeding supplement (ENSURE ENLIVE)  237 mL Oral BID BM  . insulin aspart  0-20 Units Subcutaneous TID WC  . insulin aspart  0-5 Units Subcutaneous QHS  . insulin aspart  6 Units Subcutaneous TID WC  . insulin glargine  25 Units Subcutaneous BID  . midodrine  10 mg Oral TID WC  . warfarin  3 mg Oral ONCE-1600  . Warfarin - Pharmacist Dosing Inpatient   Does not apply q1600   Continuous Infusions:  Antimicrobials: Anti-infectives (From admission, onward)   None     Objective: Vitals: Today's Vitals   07/21/20 2020 07/21/20 2024 07/22/20 0557 07/22/20 0952  BP:   (!) 152/90 (!) 146/95   Pulse:  89 90   Resp:  20 18   Temp:  97.7 F (36.5 C) 97.7 F (36.5 C)   TempSrc:  Oral Oral   SpO2:  98% 100%   Weight:      Height:      PainSc: 0-No pain   0-No pain    Intake/Output Summary (Last 24 hours) at 07/22/2020 1140 Last data filed at 07/22/2020 0952 Gross per 24 hour  Intake 480 ml  Output 1500 ml  Net -1020 ml   Filed Weights   07/16/20 1225 07/17/20 0532 07/18/20 0503  Weight: 67 kg 69.1 kg 65.5 kg   Weight change:   Intake/Output from previous day: 10/09 0701 - 10/10 0700 In: 240 [P.O.:240] Out: 1700 [Urine:1700] Intake/Output this shift: Total I/O In: 480 [P.O.:480] Out: -   Examination: General exam: AAOX3 , NAD, weak appearing. HEENT:Oral mucosa moist, Ear/Nose WNL grossly, dentition normal. Respiratory system: bilaterally CLEAR,no wheezing or crackles,no use of accessory muscle Cardiovascular system: S1 & S2 +, No JVD,. Gastrointestinal system: Abdomen soft, NT,ND, BS+ Nervous System:Alert, awake, moving extremities and grossly nonfocal Extremities: No edema, distal peripheral pulses palpable.  Skin: No rashes,no icterus. MSK: Normal muscle bulk,tone, power    Data Reviewed: I have personally reviewed labs and imaging studies CBC: Recent Labs  Lab 07/16/20 0543 07/18/20 0435 07/20/20 0422 07/21/20 0550 07/22/20 0555  WBC 17.4* 15.0* 14.4* 16.8* 15.4*  HGB 10.4* 10.4* 10.7* 10.4* 10.1*  HCT 33.3* 33.0* 34.7* 33.2* 31.8*  MCV 94.9 95.7 96.7 96.5 96.7  PLT 422* 387 318 325 147   Basic Metabolic Panel: Recent Labs  Lab 07/16/20 0543 07/18/20 0435 07/20/20 0422 07/21/20 0550 07/22/20 0555  NA 134* 137 136 136 135  K 4.5 4.4 4.3 4.4 4.5  CL 100 102 100 99 100  CO2 25 27 28 28 27   GLUCOSE 305* 149* 158* 158* 151*  BUN 33* 34* 40* 42* 38*  CREATININE 0.90 0.96 0.97 0.94 0.91  CALCIUM 9.6 9.5 9.6 9.8 9.4  MG 2.0  --   --   --   --   PHOS 2.8  --   --   --   --    GFR: Estimated Creatinine Clearance:  82 mL/min (by C-G formula based on SCr of 0.91 mg/dL). Liver Function Tests: Recent Labs  Lab 07/16/20 0543  ALBUMIN 2.9*   No results for input(s): LIPASE, AMYLASE in the last 168 hours. No results for input(s): AMMONIA in the last 168 hours. Coagulation Profile: Recent Labs  Lab 07/18/20 0435 07/19/20 0447 07/20/20 0422 07/21/20 0550 07/22/20 0555  INR 2.7* 2.7* 2.7* 3.1* 2.5*   Cardiac Enzymes: No results for input(s): CKTOTAL, CKMB, CKMBINDEX, TROPONINI in the last 168 hours. BNP (last 3 results) No results for input(s): PROBNP in the last 8760 hours. HbA1C: No results for input(s): HGBA1C in the last 72 hours. CBG: Recent Labs  Lab 07/21/20 1126 07/21/20 1646 07/21/20 1719 07/21/20 2033  07/22/20 0747  GLUCAP 208* 62* 117* 146* 160*   Lipid Profile: No results for input(s): CHOL, HDL, LDLCALC, TRIG, CHOLHDL, LDLDIRECT in the last 72 hours. Thyroid Function Tests: No results for input(s): TSH, T4TOTAL, FREET4, T3FREE, THYROIDAB in the last 72 hours. Anemia Panel: No results for input(s): VITAMINB12, FOLATE, FERRITIN, TIBC, IRON, RETICCTPCT in the last 72 hours. Sepsis Labs: No results for input(s): PROCALCITON, LATICACIDVEN in the last 168 hours.  No results found for this or any previous visit (from the past 240 hour(s)).   Radiology Studies: No results found.   LOS: 13 days   Antonieta Pert, MD [facility]  07/22/2020, 11:40 AM  In conjunction.

## 2020-07-22 NOTE — Progress Notes (Addendum)
Progress Note  Patient Name: Dennis [name]. Date of Encounter: 07/22/2020  [facility] Cardiologist: Glori Bickers, MD   Subjective   Denies pain.  Continues to have lightheadedness and near syncope with positional changes.  Inpatient Medications    Scheduled Meds: . aspirin EC  81 mg Oral Daily  . atorvastatin  20 mg Oral Daily  . dexamethasone  4 mg Oral Q12H  . feeding supplement (ENSURE ENLIVE)  237 mL Oral BID BM  . insulin aspart  0-20 Units Subcutaneous TID WC  . insulin aspart  0-5 Units Subcutaneous QHS  . insulin aspart  8 Units Subcutaneous TID WC  . insulin glargine  28 Units Subcutaneous BID  . midodrine  10 mg Oral TID WC  . Warfarin - Pharmacist Dosing Inpatient   Does not apply q1600   Continuous Infusions:  PRN Meds: acetaminophen **OR** acetaminophen (TYLENOL) oral liquid 160 mg/5 mL **OR** acetaminophen, ondansetron (ZOFRAN) IV, polyethylene glycol, senna-docusate   Vital Signs    Vitals:   07/21/20 0727 07/21/20 1325 07/21/20 2024 07/22/20 0557  BP: (!) 136/94 (!) 144/98 (!) 152/90 (!) 146/95  Pulse: 88 92 89 90  Resp: 16 20 20 18   Temp: 98.4 F (36.9 C) 97.6 F (36.4 C) 97.7 F (36.5 C) 97.7 F (36.5 C)  TempSrc: Oral Oral Oral Oral  SpO2: 100% 100% 98% 100%  Weight:      Height:        Intake/Output Summary (Last 24 hours) at 07/22/2020 1030 Last data filed at 07/22/2020 0952 Gross per 24 hour  Intake 480 ml  Output 1500 ml  Net -1020 ml   Last 3 Weights 07/18/2020 07/17/2020 07/16/2020  Weight (lbs) 144 lb 8 oz 152 lb 5.4 oz 147 lb 11.3 oz  Weight (kg) 65.545 kg 69.1 kg 67 kg  Some encounter information is confidential and restricted. Go to Review Flowsheets activity to see all data.      Telemetry    Sinus rhythm- Personally Reviewed  ECG    n/a - Personally Reviewed  Physical Exam   VS:  BP (!) 146/95 (BP Location: Right Arm)   Pulse 90   Temp 97.7 F (36.5 C) (Oral)   Resp 18   Ht 6' (1.829 m)   Wt 65.5 kg    SpO2 100%   BMI 19.60 kg/m  , BMI Body mass index is 19.6 kg/m. GENERAL:  Well appearing HEENT: Pupils equal round and reactive, fundi not visualized, oral mucosa unremarkable NECK:  No jugular venous distention, waveform within normal limits, carotid upstroke brisk and symmetric, no bruits LUNGS:  Clear to auscultation bilaterally HEART:  RRR.  PMI not displaced or sustained,S1 and S2 within normal limits, no S3, no S4, no clicks, no rubs, no murmurs ABD:  Flat, positive bowel sounds normal in frequency in pitch, no bruits, no rebound, no guarding, no midline pulsatile mass, no hepatomegaly, no splenomegaly EXT:  2 plus pulses throughout, no edema, no cyanosis no clubbing SKIN:  No rashes no nodules NEURO:  Cranial nerves II through XII grossly intact, motor grossly intact throughout PSYCH:  Cognitively intact, oriented to person place and time   Labs    High Sensitivity Troponin:  No results for input(s): TROPONINIHS in the last 720 hours.    Chemistry Recent Labs  Lab 07/16/20 0543 07/18/20 0435 07/20/20 0422 07/21/20 0550 07/22/20 0555  NA 134*   < > 136 136 135  K 4.5   < > 4.3 4.4 4.5  CL 100   < > 100 99 100  CO2 25   < > 28 28 27   GLUCOSE 305*   < > 158* 158* 151*  BUN 33*   < > 40* 42* 38*  CREATININE 0.90   < > 0.97 0.94 0.91  CALCIUM 9.6   < > 9.6 9.8 9.4  ALBUMIN 2.9*  --   --   --   --   GFRNONAA >60   < > >60 >60 >60  GFRAA >60  --   --   --   --   ANIONGAP 9   < > 8 9 8    < > = values in this interval not displayed.     Hematology Recent Labs  Lab 07/20/20 0422 07/21/20 0550 07/22/20 0555  WBC 14.4* 16.8* 15.4*  RBC 3.59* 3.44* 3.29*  HGB 10.7* 10.4* 10.1*  HCT 34.7* 33.2* 31.8*  MCV 96.7 96.5 96.7  MCH 29.8 30.2 30.7  MCHC 30.8 31.3 31.8  RDW 15.3 15.5 15.5  PLT 318 325 309    BNPNo results for input(s): BNP, PROBNP in the last 168 hours.   DDimer No results for input(s): DDIMER in the last 168 hours.   Radiology    No results  found.  Cardiac Studies   Carotid Dopplers 07/09/2020: 1 to 39% ICA stenosis bilaterally.  Echo 07/09/2020: IMPRESSIONS    1. Since the last exam on 12/25/2019 LVEF has slightly improved from  15-20% to 25-30% with diffuse hypokinesis.  2. Left ventricular ejection fraction, by estimation, is 25 to 30%. The  left ventricle has severely decreased function. The left ventricle  demonstrates global hypokinesis. The left ventricular internal cavity size  was moderately dilated. There is mild  concentric left ventricular hypertrophy. Left ventricular diastolic  parameters are consistent with Grade II diastolic dysfunction  (pseudonormalization). Elevated left atrial pressure.  3. Right ventricular systolic function is normal. The right ventricular  size is normal. There is normal pulmonary artery systolic pressure.  4. A small pericardial effusion is present. The pericardial effusion is  posterior to the left ventricle.  5. The mitral valve is normal in structure. Mild mitral valve  regurgitation. No evidence of mitral stenosis.  6. The aortic valve is normal in structure. Aortic valve regurgitation is  trivial. No aortic stenosis is present.  7. The inferior vena cava is normal in size with greater than 50%  respiratory variability, suggesting right atrial pressure of 3 mmHg.   Patient Profile     59 y.o. 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  He was found to have brain metastases.  Hospitalization was complicated by multiple episodes of micturition syncope and  orthostasis..  Assessment & Plan    # Recurrent syncope:  Due to severe orthostasis.  He continued to have orthostatic changes with systolic blood pressures dropping to the 70s yesterday.  Continue TED hose and abdominal binder.  Continue midodrine.  We will give a gentle fluid bolus of 500 mL.  I also encouraged him to add salt to his meals.  Will need to monitor closely for worsening HF symptoms.   # Chronic systolic and diastolic heart failure: # Ventricular non-compaction. LVEF 25-30%.  He is  on warfarin due to non-compaction.  INR goal 2-3.  He is not on guideline directed HF therapy 2/2 hypotension and syncope requiring midodrine.  We will give a gentle 500 mL fluid bolus.  Continue abdominal binder and TED hose.  # CAD:  Occluded LAD with collateral flow. Continue statin.  Hold aspirin given warfarin and fall risk with syncope and brain mets.  Continue atorvastatin.   # Polysubstance abuse:  Cocaine + on admission.  Advise cessation.        For questions or updates, please contact [place] Please consult [email or web address] for contact info under        Signed, Skeet Latch, MD  07/22/2020, 10:30 AM

## 2020-07-23 ENCOUNTER — Telehealth: Payer: Self-pay | Admitting: Radiation Therapy

## 2020-07-23 DIAGNOSIS — I5022 Chronic systolic (congestive) heart failure: Secondary | ICD-10-CM | POA: Diagnosis not present

## 2020-07-23 DIAGNOSIS — Z7189 Other specified counseling: Secondary | ICD-10-CM | POA: Diagnosis not present

## 2020-07-23 DIAGNOSIS — C3490 Malignant neoplasm of unspecified part of unspecified bronchus or lung: Secondary | ICD-10-CM | POA: Diagnosis not present

## 2020-07-23 DIAGNOSIS — R2681 Unsteadiness on feet: Secondary | ICD-10-CM | POA: Diagnosis not present

## 2020-07-23 DIAGNOSIS — Z515 Encounter for palliative care: Secondary | ICD-10-CM | POA: Diagnosis not present

## 2020-07-23 DIAGNOSIS — I951 Orthostatic hypotension: Secondary | ICD-10-CM | POA: Diagnosis not present

## 2020-07-23 LAB — PROTIME-INR
INR: 2.2 — ABNORMAL HIGH (ref 0.8–1.2)
Prothrombin Time: 23.9 seconds — ABNORMAL HIGH (ref 11.4–15.2)

## 2020-07-23 LAB — BASIC METABOLIC PANEL
Anion gap: 8 (ref 5–15)
BUN: 39 mg/dL — ABNORMAL HIGH (ref 6–20)
CO2: 29 mmol/L (ref 22–32)
Calcium: 9.5 mg/dL (ref 8.9–10.3)
Chloride: 99 mmol/L (ref 98–111)
Creatinine, Ser: 0.91 mg/dL (ref 0.61–1.24)
GFR, Estimated: >60 mL/min (ref >60)
Glucose, Bld: 164 mg/dL — ABNORMAL HIGH (ref 70–99)
Potassium: 4.5 mmol/L (ref 3.5–5.1)
Sodium: 136 mmol/L (ref 135–145)

## 2020-07-23 LAB — CBC
HCT: 33.4 % — ABNORMAL LOW (ref 39.0–52.0)
Hemoglobin: 10.4 g/dL — ABNORMAL LOW (ref 13.0–17.0)
MCH: 30.1 pg (ref 26.0–34.0)
MCHC: 31.1 g/dL (ref 30.0–36.0)
MCV: 96.8 fL (ref 80.0–100.0)
Platelets: 285 10*3/uL (ref 150–400)
RBC: 3.45 MIL/uL — ABNORMAL LOW (ref 4.22–5.81)
RDW: 15.6 % — ABNORMAL HIGH (ref 11.5–15.5)
WBC: 15.6 10*3/uL — ABNORMAL HIGH (ref 4.0–10.5)
nRBC: 0 % (ref 0.0–0.2)

## 2020-07-23 LAB — GLUCOSE, CAPILLARY
Glucose-Capillary: 166 mg/dL — ABNORMAL HIGH (ref 70–99)
Glucose-Capillary: 199 mg/dL — ABNORMAL HIGH (ref 70–99)
Glucose-Capillary: 196 mg/dL — ABNORMAL HIGH (ref 70–99)
Glucose-Capillary: 172 mg/dL — ABNORMAL HIGH (ref 70–99)

## 2020-07-23 MED ORDER — SODIUM CHLORIDE 1 G PO TABS
1.0000 g | ORAL_TABLET | Freq: Three times a day (TID) | ORAL | Status: DC
  Administered 2020-07-23 – 2020-07-24 (×4): 1 g via ORAL
  Filled 2020-07-23 (×5): qty 1

## 2020-07-23 MED ORDER — WARFARIN SODIUM 2.5 MG PO TABS
3.7500 mg | ORAL_TABLET | Freq: Once | ORAL | Status: AC
  Administered 2020-07-23: 3.75 mg via ORAL
  Filled 2020-07-23: qty 1

## 2020-07-23 NOTE — Progress Notes (Signed)
Daily Progress Note   Patient Name: Dennis [name].       Date: 07/23/2020 DOB: November 12, 1960  Age: 59 y.o. MRN#: [id number] Attending Physician: Dennis Pert, MD Primary Care Physician: Dennis Pee, MD Admit Date: 07/07/2020  Reason for Consultation/Follow-up: Establishing goals of care  Subjective: I met today with Dennis [name].  He was sitting in bedside chair and reports he was able to ambulate and felt less dizzy while doing so today.  He reports that he still does not feel comfortable going home.  Discussed plan for follow-up with Dr. Mickeal [name] on Friday.  We reviewed plan for advance care planning.  He has copy of advance directive as well as MOST form at the bedside.  He reports he is going to review these with his family whenever he does return home.  He denies any questions regarding completion of documents.  Length of Stay: 14  Current Medications: Scheduled Meds:  . atorvastatin  20 mg Oral Daily  . dexamethasone  4 mg Oral Q12H  . feeding supplement (ENSURE ENLIVE)  237 mL Oral BID BM  . insulin aspart  0-20 Units Subcutaneous TID WC  . insulin aspart  0-5 Units Subcutaneous QHS  . insulin aspart  6 Units Subcutaneous TID WC  . insulin glargine  25 Units Subcutaneous BID  . midodrine  10 mg Oral TID WC  . sodium chloride  1 g Oral TID WC  . warfarin  3.75 mg Oral ONCE-1600  . Warfarin - Pharmacist Dosing Inpatient   Does not apply q1600    Continuous Infusions:   PRN Meds: acetaminophen **OR** acetaminophen (TYLENOL) oral liquid 160 mg/5 mL **OR** acetaminophen, ondansetron (ZOFRAN) IV, polyethylene glycol, senna-docusate  Physical Exam         General: Alert, awake, in no acute distress.  HEENT: No bruits, no goiter, no JVD Heart: Regular rate and rhythm. No murmur  appreciated. Lungs: Good air movement, clear Abdomen: Soft, nontender, nondistended, positive bowel sounds.  Ext: No significant edema Skin: Warm and dry Neuro: Grossly intact, nonfocal.  Vital Signs: BP (!) 163/97 (BP Location: Right Arm)   Pulse 91   Temp 97.8 F (36.6 C) (Oral)   Resp 18   Ht 6' (1.829 m)   Wt 70.3 kg   SpO2 100%   BMI 21.02 kg/m  SpO2:  SpO2: 100 % O2 Device: O2 Device: Room Air O2 Flow Rate:    Intake/output summary:   Intake/Output Summary (Last 24 hours) at 07/23/2020 1654 Last data filed at 07/23/2020 0437 Gross per 24 hour  Intake 240 ml  Output 1270 ml  Net -1030 ml   LBM: Last BM Date: 07/22/20 Baseline Weight: Weight: 62.1 kg Most recent weight: Weight: 70.3 kg       Palliative Assessment/Data:    Flowsheet Rows     Most Recent Value  Intake Tab  Referral Department Hospitalist  Unit at Time of Referral Med/Surg Unit  Palliative Care Primary Diagnosis Cancer  Date Notified 07/19/20  Palliative Care Type New Palliative care  Reason for referral Clarify Goals of Care  Date of Admission 07/07/20  Date first seen by Palliative Care 07/20/20  # of days Palliative referral response time 1 Day(s)  # of days IP prior to Palliative referral 12  Clinical Assessment  Palliative Performance Scale Score 60%  Psychosocial & Spiritual Assessment  Palliative Care Outcomes      Patient Active Problem List   Diagnosis Date Noted  . Orthostatic hypotension   . Syncope 07/09/2020  . Slurred speech 07/08/2020  . Uncontrolled type 2 diabetes mellitus with hyperglycemia, with long-term current use of insulin ([place]) 07/08/2020  . Unsteady gait when walking 07/07/2020  . Small cell lung cancer, right upper lobe ([place]) 01/19/2020  . Encounter for antineoplastic chemotherapy 01/19/2020  . Encounter for antineoplastic immunotherapy 01/19/2020  . Extensive stage primary small cell carcinoma of lung ([place]) 01/04/2020  . Goals of care,  counseling/discussion 01/04/2020  . Mediastinal adenopathy   . Mass of upper lobe of right lung   . Brain mass 12/23/2019  . Vasogenic cerebral edema ([place]) 12/23/2019  . Left leg paresthesias   . History of ischemic right MCA stroke   . Acute on chronic systolic congestive heart failure ([place])   . Left-sided weakness   . 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[place])   . Cocaine abuse ([place]) 01/24/2015  . Essential hypertension 01/24/2015  . Left hemiplegia ([place]) 01/24/2015  . Hyperlipidemia associated with type 2 diabetes mellitus ([place]) 11/30/2014  . Chronic systolic heart failure ([place]) 06/20/2014  . Automatic implantable cardioverter-defibrillator in situ   . Long term (current) use of anticoagulants 08/09/2013  . Drug abuse and dependence ([place])   . Cardiomyopathy, ischemic 07/21/2011  . Type II diabetes mellitus with ophthalmic manifestations ([place]) 09/13/2007    Palliative Care Assessment & Plan   Patient Profile: 59 year old male with past medical history of MCA CVA, small cell lung cancer with brain mets status post whole brain radiation, chronic CHF status post ICD, IDDM who presented with unsteady gait, slurred speech and left upper extremity numbness.  He was admitted for concern of acute on chronic CVA.  CT head did not show any acute finding.  Due to ICD, it took time to have MRI completed but following completion of this it was documented that there was a "Newly seen abnormal enhancement affecting the ventral pons from the pontomedullary junction to the midbrain.  This is presumed represent metastatic disease."  Following this discovery, consultation with Dr. Mickeal [name] (neurooncologist) resulted in recommendation for presentation at tumor board next week to discuss potential benefits of further radiation.  Additionally, he continues to suffer from periods of orthostatic hypotension and syncope.  He reports having a fall yesterday.  Palliative consulted for goals of care.  Assessment:  Patient Active Problem List   Diagnosis Date Noted  .  Orthostatic hypotension   . Syncope 07/09/2020  . Slurred speech 07/08/2020  . Uncontrolled type 2 diabetes mellitus with hyperglycemia, with long-term current use of insulin ([place]) 07/08/2020  . Unsteady gait when walking 07/07/2020  . Small cell lung cancer, right upper lobe ([place]) 01/19/2020  . Encounter for antineoplastic chemotherapy 01/19/2020  . Encounter for antineoplastic immunotherapy 01/19/2020  . Extensive stage primary small cell carcinoma of lung ([place]) 01/04/2020  . Goals of care, counseling/discussion 01/04/2020  . Mediastinal adenopathy   . Mass of upper lobe of right lung   . Brain mass 12/23/2019  . Vasogenic cerebral edema ([place]) 12/23/2019  . Left leg paresthesias   . History of ischemic right MCA stroke   . Acute on chronic systolic congestive heart failure ([place])   . Left-sided weakness   . 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[place])   . Cocaine abuse ([place]) 01/24/2015  . Essential hypertension 01/24/2015  . Left hemiplegia ([place]) 01/24/2015  . Hyperlipidemia associated with type 2 diabetes mellitus ([place]) 11/30/2014  . Chronic systolic heart failure ([place]) 06/20/2014  . Automatic implantable cardioverter-defibrillator in situ   . Long term (current) use of anticoagulants 08/09/2013  . Drug abuse and dependence ([place])   . Cardiomyopathy, ischemic 07/21/2011  . Type II diabetes mellitus with ophthalmic manifestations ([place]) 09/13/2007    Recommendations/Plan:  Full code/full scope.  He wants to discuss this further with family prior to make any changes to CODE STATUS.  He is planning to discharge and follow-up with Dr. Mickeal [name] regarding treatment for new likely metastatic brain lesion.  He would eventually like to name his brother as his healthcare power of attorney.  He wants to review advanced care documents, including power of attorney, and MOST form with his family prior to completion.  No other palliative  specific recommendations at this time.  We would not continue to round on Mr. Westberg daily.  Please call if we can be of further assistance in his care moving forward.  Goals of Care and Additional Recommendations:  Limitations on Scope of Treatment: Full Scope Treatment  Code Status:    Code Status Orders  (From admission, onward)         Start     Ordered   07/08/20 0127  Full code  Continuous        07/08/20 0126        Code Status History    Date Active Date Inactive Code Status Order ID Comments User Context   12/23/2019 1438 12/28/2019 2357 Full Code [id number]  Richarda Osmond, DO Inpatient   09/04/2018 2356 09/05/2018 1650 Full Code [id number]  Phillips Grout, MD ED   08/17/2015 2244 08/23/2015 2157 Full Code [id number]  Harriet Butte, NP Inpatient   08/17/2015 0032 08/17/2015 2244 Full Code [id number]  Dalia Heading, PA-C ED   07/16/2015 1846 07/17/2015 1836 Full Code [id number]  Davonna Belling, MD ED   02/07/2015 2004 02/14/2015 1629 Full Code [id number]  Bary Leriche, PA-C Inpatient   02/07/2015 1030 02/07/2015 2004 Full Code [id number]  Jolaine Artist, MD Inpatient   02/01/2015 1353 02/07/2015 1030 Full Code [id number]  Hosie Poisson, MD Inpatient   01/26/2015 1821 02/01/2015 1353 Full Code [id number]  Bary Leriche, PA-C Inpatient   01/24/2015 1914 01/26/2015 1821 Full Code [id number]  Annita Brod, MD Inpatient   09/21/2014 1634 09/25/2014 1640 Full Code [id number]  Conrad [place], NP Inpatient   05/30/2014 1445 06/01/2014 1725 Full Code [id number]  Bensimhon, Shaune Pascal, MD Inpatient  05/26/2014 1901 05/30/2014 1445 Full Code [id number]  Blain Pais, MD Inpatient   08/09/2013 1426 08/10/2013 1705 Full Code [id number]  Ivor Costa, MD Inpatient   Advance Care Planning Activity       Prognosis:   Unable to determine  Discharge Planning:  Home with [place] was discussed with patient  Thank you for allowing the Palliative Medicine Team to  assist in the care of this patient.   Total Time 20 Prolonged Time Billed  no      Greater than 50%  of this time was spent counseling and coordinating care related to the above assessment and plan.  Micheline Rough, MD  Please contact Palliative Medicine Team phone at [phone] for questions and concerns.

## 2020-07-23 NOTE — Progress Notes (Signed)
Cardiologist:  Bensimohn   Subjective:  Denies SSCP, palpitations or Dyspnea ? Being d/c today but has not ambulated   Objective:  Vitals:   07/22/20 1145 07/22/20 1754 07/22/20 2051 07/23/20 0439  BP: 129/87  131/89 130/90  Pulse: 100  95 89  Resp: 18  18 18   Temp: 97.8 F (36.6 C)  (!) 97.5 F (36.4 C) 97.8 F (36.6 C)  TempSrc: Oral  Oral Oral  SpO2: 99%  100% 100%  Weight:  70.3 kg    Height:  6' (1.829 m)      Intake/Output from previous day:  Intake/Output Summary (Last 24 hours) at 07/23/2020 0914 Last data filed at 07/23/2020 0437 Gross per 24 hour  Intake 960 ml  Output 1270 ml  Net -310 ml    Physical Exam: Affect appropriate Healthy:  appears stated age HEENT: normal Neck supple with no adenopathy JVP normal no bruits no thyromegaly Lungs clear with no wheezing and good diaphragmatic motion Heart:  S1/S2 no murmur, no rub, gallop or click PMI increased  Abdomen: benighn, BS positve, no tenderness, no AAA no bruit.  No HSM or HJR Distal pulses intact with no bruits No edema Neuro non-focal Skin warm and dry No muscular weakness   Lab Results: Basic Metabolic Panel: Recent Labs    07/22/20 0555 07/23/20 0513  NA 135 136  K 4.5 4.5  CL 100 99  CO2 27 29  GLUCOSE 151* 164*  BUN 38* 39*  CREATININE 0.91 0.91  CALCIUM 9.4 9.5   Liver Function Tests: No results for input(s): AST, ALT, ALKPHOS, BILITOT, PROT, ALBUMIN in the last 72 hours. No results for input(s): LIPASE, AMYLASE in the last 72 hours. CBC: Recent Labs    07/22/20 0555 07/23/20 0513  WBC 15.4* 15.6*  HGB 10.1* 10.4*  HCT 31.8* 33.4*  MCV 96.7 96.8  PLT 309 285    Anemia Panel: No results for input(s): VITAMINB12, FOLATE, FERRITIN, TIBC, IRON, RETICCTPCT in the last 72 hours.  Imaging: No results found.  Cardiac Studies:  ECG: SR LAE LVH no acute changes    Telemetry:  NSR 07/23/2020   Echo: Improved compared to 12/25/19 now 25-30%   Medications:   .  atorvastatin  20 mg Oral Daily  . dexamethasone  4 mg Oral Q12H  . feeding supplement (ENSURE ENLIVE)  237 mL Oral BID BM  . insulin aspart  0-20 Units Subcutaneous TID WC  . insulin aspart  0-5 Units Subcutaneous QHS  . insulin aspart  6 Units Subcutaneous TID WC  . insulin glargine  25 Units Subcutaneous BID  . midodrine  10 mg Oral TID WC  . warfarin  3.75 mg Oral ONCE-1600  . Warfarin - Pharmacist Dosing Inpatient   Does not apply q1600      Assessment/Plan:  59 y.o. 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  He was found to have brain metastases.  Hospitalization was complicated by multiple episodes of micturition syncope and orthostasis..  1. Orthostatsis:  Needs to ambulate before d/c improved with small fluid bolus. Continue midodrin and also on dexamethazone  2. DCM:  EF slightly improved on echo 07/09/20 not on GDMT due to orthostasis  3. CAD:  Known occulded LAD with collateral flow no chest pain ECG ok continue statin   4. Cocaine:  Positive on admission   5. Cancer:  Small cell lung with brain mets last MRI 07/20/20 with resolution of right frontal metastasis but new lesions in ventral pons   Cardiology will sign off   Jenkins Rouge 07/23/2020, 9:14 AM

## 2020-07-23 NOTE — Progress Notes (Signed)
Chaplain did f/u visit with patient. He was sitting in chair and remembered me from last week.  He told chaplain he was getting out today. "My brother [name] is picking me up at 3."  "I have the paperwork (AD) you gave me last week and I'm taking it home and talking about with my mom."  "I have no complaints. The staff has been great" "My biggest worries is that I have to come back in three days....that's no good."  Patient says he knows there's a mass and he will have radiation. "I've had it before."  "It is what it is."  Patient says "it's all in God's hands."  Chaplain offered prayer and blessing and patient received it with thanks. Rev. Tamsen Snider Pager [phone]

## 2020-07-23 NOTE — TOC Progression Note (Addendum)
Transition of Care (TOC) - Progression Note    Patient Details  Name: Dennis [name]. MRN: [id number] Date of Birth: 1961-02-01  Transition of Care [facility]) CM/SW Contact  Ross Ludwig, [place] Phone Number: 07/23/2020, 2:43 PM  Clinical Narrative:     Patient will be going home with his brother.  CSW attempted to call him to get address for patient's equipment to be delivered.  CSW left a message awaiting for a call back.    4:00pm  CSW received phone call back from patient's brother Dennis [name].  He is requesting home health if possible.  CSW informed him it may be difficult to find it, but CSW will try.  CSW informed brother that he may have to go outpatient PT.  Patient's brother is also requesting a hospital bed, beside commode, and wheelchair.  CSW contacted [employer] and spoke to Thedore Mins he will work on trying to get equipment ordered.  Patient's brother asked about a hospital bed tray, and CSW informed him that may be something they would have to private pay, because insurance does not pay for it.  CSW confirmed patient's address that he will be going to is the same as on the face sheet.  Expected Discharge Plan and Services  Patient plans to discharge home with brother once medically ready for discharge.                                               Social Determinants of Health (SDOH) Interventions    Readmission Risk Interventions No flowsheet data found.

## 2020-07-23 NOTE — Plan of Care (Signed)
  Problem: Education: Goal: Knowledge of General Education information will improve Description: Including pain rating scale, medication(s)/side effects and non-pharmacologic comfort measures Outcome: Progressing   Problem: Health Behavior/Discharge Planning: Goal: Ability to manage health-related needs will improve Outcome: Progressing   Problem: Clinical Measurements: Goal: Ability to maintain clinical measurements within normal limits will improve Outcome: Progressing Goal: Will remain free from infection Outcome: Progressing Goal: Diagnostic test results will improve Outcome: Progressing Goal: Cardiovascular complication will be avoided Outcome: Progressing   Problem: Activity: Goal: Risk for activity intolerance will decrease Outcome: Progressing   Problem: Nutrition: Goal: Adequate nutrition will be maintained Outcome: Progressing   Problem: Elimination: Goal: Will not experience complications related to bowel motility Outcome: Progressing Goal: Will not experience complications related to urinary retention Outcome: Progressing   Problem: Safety: Goal: Ability to remain free from injury will improve Outcome: Progressing   Problem: Skin Integrity: Goal: Risk for impaired skin integrity will decrease Outcome: Progressing

## 2020-07-23 NOTE — Clinical Social Work Note (Addendum)
  CHF or Chronic Pulmonary Condition Patient suffers from [facility] Heart Failure and has trouble breathing at night when head is elevated less than _30_degrees. Bed wedges do not provide enough elevation to resolve breathing issues.  Difficult breathing and orthostaic blood pressure cause patient to require frequent changes in body position which cannot be achieved with a normal bed.  Jones Broom. Deniyah Dillavou, MSW, LCSW [phone]  07/23/2020 3:56 PM

## 2020-07-23 NOTE — Progress Notes (Addendum)
Physical Therapy Treatment Patient Details Name: Dennis [name]. MRN: [id number] DOB: May 31, 1961 Today's Date: 07/23/2020    History of Present Illness 59 yo male admitted with unsteady gait, slurred speech. Hx of CHF, DM, CVA, pacemaker, MDD, metastatic lung ca, polysubstance abuse    PT Comments    Pt was able to ambulate on today-wearing abd binder and TED hose. He denied dizziness. He continues to have a drop in blood pressure with position changes. He remains at risk for falls due to unpredictability of syncopal episodes.     Follow Up Recommendations  Home health PT;Supervision/Assistance - 24 hour     Equipment Recommendations  Rolling walker with 5" wheels;Wheelchair (measurements PT) (may need to consider wheelchair if BP/passing out remains an issue)    Recommendations for Other Services OT consult     Precautions / Restrictions Precautions Precautions: Fall Precaution Comments: ABD binder and TED hose for BP Restrictions Weight Bearing Restrictions: No    Mobility  Bed Mobility               General bed mobility comments: oob in recliner  Transfers Overall transfer level: Needs assistance Equipment used: Rolling walker (2 wheeled) Transfers: Sit to/from Stand Sit to Stand: Min guard         General transfer comment: Cues for pt to take his time and to stand statically for a bit before taking off to walk.  Ambulation/Gait Ambulation/Gait assistance: Min assist Gait Distance (Feet): 300 Feet Assistive device: Rolling walker (2 wheeled) Gait Pattern/deviations: Step-through pattern;Decreased stride length     General Gait Details: Unsteady. 1 standing rest break to assess BP after ~150 feet. Pt denied dizziness.   Stairs             Wheelchair Mobility    Modified Rankin (Stroke Patients Only)       Balance Overall balance assessment: Needs assistance;History of Falls         Standing balance support: Bilateral upper extremity  supported Standing balance-Leahy Scale: Fair                              Cognition   Behavior During Therapy: WFL for tasks assessed/performed                                          Exercises      General Comments        Pertinent Vitals/Pain Pain Assessment: No/denies pain    Home Living                      Prior Function            PT Goals (current goals can now be found in the care plan section) Progress towards PT goals: Progressing toward goals    Frequency    Min 3X/week      PT Plan Current plan remains appropriate    Co-evaluation              AM-PAC PT "6 Clicks" Mobility   Outcome Measure  Help needed turning from your back to your side while in a flat bed without using bedrails?: None Help needed moving from lying on your back to sitting on the side of a flat bed without using bedrails?: None Help needed moving to and from a bed  to a chair (including a wheelchair)?: A Little Help needed standing up from a chair using your arms (e.g., wheelchair or bedside chair)?: A Little Help needed to walk in hospital room?: A Little Help needed climbing 3-5 steps with a railing? : A Lot 6 Click Score: 19    End of Session Equipment Utilized During Treatment: Gait belt Activity Tolerance: Patient tolerated treatment well Patient left: in chair;with call bell/phone within reach;with chair alarm set   PT Visit Diagnosis: Difficulty in walking, not elsewhere classified (R26.2)     Time: 3762-8315 PT Time Calculation (min) (ACUTE ONLY): 17 min  Charges:  $Gait Training: 8-22 mins                        Doreatha Massed, PT Acute Rehabilitation  Office: [phone] Pager: [phone]

## 2020-07-23 NOTE — Plan of Care (Signed)
  Problem: Education: Goal: Knowledge of General Education information will improve Description Including pain rating scale, medication(s)/side effects and non-pharmacologic comfort measures Outcome: Progressing   Problem: Activity: Goal: Risk for activity intolerance will decrease Outcome: Progressing   Problem: Safety: Goal: Ability to remain free from injury will improve Outcome: Progressing   

## 2020-07-23 NOTE — Progress Notes (Signed)
[place] for Warfarin Indication: advanced (noncompaction) cardiomyopathy  No Known Allergies  Patient Measurements: Height: 6' (182.9 cm) Weight: 70.3 kg (154 lb 15.7 oz) IBW/kg (Calculated) : 77.6  Vital Signs: Temp: 97.8 F (36.6 C) (10/11 0439) Temp Source: Oral (10/11 0439) BP: 130/90 (10/11 0439) Pulse Rate: 89 (10/11 0439)  Labs: Recent Labs    07/21/20 0550 07/21/20 0550 07/22/20 0555 07/23/20 0513  HGB 10.4*   < > 10.1* 10.4*  HCT 33.2*  --  31.8* 33.4*  PLT 325  --  309 285  LABPROT 30.9*  --  26.3* 23.9*  INR 3.1*  --  2.5* 2.2*  CREATININE 0.94  --  0.91 0.91   < > = values in this interval not displayed.    Estimated Creatinine Clearance: 88 mL/min (by C-G formula based on SCr of 0.91 mg/dL).  Medications:  Scheduled:  . atorvastatin  20 mg Oral Daily  . dexamethasone  4 mg Oral Q12H  . feeding supplement (ENSURE ENLIVE)  237 mL Oral BID BM  . insulin aspart  0-20 Units Subcutaneous TID WC  . insulin aspart  0-5 Units Subcutaneous QHS  . insulin aspart  6 Units Subcutaneous TID WC  . insulin glargine  25 Units Subcutaneous BID  . midodrine  10 mg Oral TID WC  . 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 Per med rec and chart records on visit on 9/3, pt is on Warfarin 7.5 mg On Tues/Thur/Sat and 3.75 mg on all other days. Baseline INR subtherapeutic on admission; possible noncompliance.  Today, 07/23/20   INR therapeutic 2.2  Hgb and Plts stable  CT head negative for hemorrhage  No major drug-drug interactions with warfarin; continues on concomitant ASA from PTA  Goal of Therapy:  INR 2-3 Monitor platelets by anticoagulation protocol: Yes   Plan:   Warfarin 3.75mg  today  Daily INR   For discharge, recommend resuming PTA dosing with INR check in 3-5  days  Monitor for signs and symptoms of bleeding   Peggyann Juba, PharmD, BCPS Pharmacy: [phone] 07/23/2020 8:13 AM

## 2020-07-23 NOTE — Telephone Encounter (Signed)
Spoke directly with pt about his upcoming visit with Dr. Mickeal Skinner on Friday 10/15 @ 10:00. Dennis [name] knows where to come and how to check in for this visit.   Mont Dutton R.T.(R)(T) Radiation Special Procedures Navigator

## 2020-07-23 NOTE — Progress Notes (Signed)
PROGRESS NOTE    Dennis [name].  OAC:[id number] DOB: 1961/03/24 DOA: 07/07/2020 PCP: [name], MD   Chief Complaint  Patient presents with  . 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  CT head without contrast without acute finding.  Neurology consulted.  Could not do MRI due to ICD.  Repeat CT head with contrast did not reveal CVA but concern about infiltrating enhancement in ventral pons and proximal medulla concerning of metastatic disease.  EEG without epileptiform or seizure activity.  Patient with significant orthostatic hypotension even after holding cardiac meds, starting midodrine, TED hose and abdominal binder.  Initially there was some vasovagal element to his orthostasis but vasovagal issues seem to have resolved.  Echo with EF of 25 to 30% (previously 15 to 20%), diffuse hypokinesis G2-DD.  Cardiology following.  Radiation oncology consulted by oncology, and hoping to do MRI brain for better info about infiltrating enhancement in midbrain. However, patient has AICD which is " MRI conditional" and " not FDA approved". Cardiology willing to turn off AICD if radiology is willing to do MRI brain. Radiation oncology may order special CT with thin cuts .  Based on location MRI has been recommended operatively with meeting by his radiation oncologist Dr Mauri Reading. Patient underwent MRI of the brain after turning pacer off- "Newly seen abnormal enhancement affecting the ventral pons from the pontomedullary junction to the mid brain. This is presumed represent metastatic disease" Patient continues to have lightheadedness and near syncopal episode.seen b ycards- s/p bolus nss 500 ml 10/10 10/11-added salt tablet for ongoing  orthostasis  Subjective: Patient was not dizzy during orthostatic vital check today however blood pressure did drop to 50s from 150s Again feels nervous about going home  Assessment & Plan:  Slurred speech/LUE paresthesia/unsteady gait in patient with prior right MCA CVA/acute metabolic encephalopathy on admission- CT head with and without contrast did not reveal CVA but possible pontine and medullary infiltrating lesion concerning for metastatic disease-underwent MRI of the brain after turning a pacemaker off 10/8 that shows abnormal enhancement affecting the ventral pons from the pontomedullary junction to the mid-brain-this is presumed to represent metastatic disease.  No acute stroke. Echo EF of 25 to 30% (improved). EEG no seizure activity, UDS positive for cocaine.HBA1c 10.5%.  LDL 77.  Overall neurologically intact.   Recurrent syncope/orthostatic hypotension: Syncope due to severe orthostasis.  Multiple episodes, and micturition syncope and orthostasis.  Blood pressure in the 80s, 150s on standing but not overtly symptomatic and was somewhat unsteady.  Echo with LVEF 25 to 30%.Cortisol low which is expected while on dexamethasone. TSH normal. s/p 500 ml bolus NS 10/110 as per cardio. He is on ,Midodrine 10 mg 3 tid, TED hose and abdominal binder.  Patient is also on Decadron. Discussed with Cardio-salt tab and advised not to use Florinef due to CHF-monitor volume status closely . Ordered 1 gram 3 times daily, if remains orthostatic will consider uptitrating dose.I explained to the patient that we have tried all the medication/treatments but unfortunately he continues to be orthostatic.  We discussed that he may need to modify his lifestyle-decrease mobility use of bedside constant supervision, avoid independent ambulation etcs.  Small cell lung cancer with brain mets status post whole brain radiation:CT  head with and without contrast concerning for pontine and medullary infiltrates so on Decadron  4 mg every 12 hours per oncology .Discussed with Dr Mickeal Skinner and recommend MRI brain with and without contrast, and cardiology has turned off pacer and had MRI 10/8- shows:"Newly seen abnormal enhancement affecting the ventral pons from the pontomedullary junction to the mid brain. This is presumed represent metastatic disease. The possibility of treatment effect is considered but felt unlikely. Resolution of the previously seen right frontal metastasis. Old right MCA territory infarction"Spoke w/ Dr Mickeal Skinner and likely tumor, he will let radio onc know for possible fractionated radiosurgery ( likely outpatient, tumor board meeting monday).  He has follow-up with radiation oncology on 10/15. Seen by palliative care patient would like to discuss with family  Chronic systolic CHF status post AICD March/2021: Not on guideline directed heart failure therapy due to his hypotension and syncope.  Per cardiology. CAD history occluded LAD with collaterals/HLD/HTN: No chest pain, continue aspirin and statin and Coumadin.  On midodrine due to hypotension.  History of Rt MCA CVA/cardiomyopathy on Coumadin per cardiology.  If he continues to be symptomatic w/ syncope-despite maximal therapy will need to reassess coumadin safety- will defer to cardiology.   Polysubstance abuse/Cocaine abuse: Cessation advised.He is not forthcoming about his drug use.  Uncontrolled type 2 diabetes mellitus with hyperglycemia, with long-term current use of insulin. hba1c 10.5,Cont on Lantus of 28 units twice daily, Premeal insulin 8 U -which was held 10/9 due to hypoglycemia sugar 62. Cont on Lantus to 25 units BID and prremeal insulin 6 units, SSI. He is on steroids continue conservative normal. Recent Labs  Lab 07/22/20 1142 07/22/20 1707 07/22/20 2047 07/23/20 0741 07/23/20 1157  GLUCAP 191* 208* 195* 166* 199*   Lethargy/possible delirium-mental status is stable at baseline.    Leukocytosis likely from steroid remains stable.   Afebrile. Recent Labs  Lab 07/18/20 0435 07/20/20 0422 07/21/20 0550 07/22/20 0555 07/23/20 0513  WBC 15.0* 14.4* 16.8* 15.4* 15.6*   Debility/deconditioning/failure to thrive: Continue to work with PT OT, case manager trying to set up [facility], hospital bed, walker for his discharge  Anemia of chronic disease: Hemoglobin overall stable. Recent Labs  Lab 07/18/20 0435 07/20/20 0422 07/21/20 0550 07/22/20 0555 07/23/20 0513  HGB 10.4* 10.7* 10.4* 10.1* 10.4*  HCT 33.0* 34.7* 33.2* 31.8* 33.4*    Nutrition: Diet Order            Diet Carb Modified Fluid consistency: Thin; Room service appropriate? Yes with Assist  Diet effective now                 Nutrition Problem: Increased nutrient needs Etiology: cancer and cancer related treatments (CHF, substance abuse) Signs/Symptoms: estimated needs Interventions: Ensure Enlive (each supplement provides 350kcal and 20 grams of protein)  Body mass index is 21.02 kg/m.  DVT prophylaxis: Place TED hose Start: 07/10/20 1054 SCD's Start: 07/08/20 0127 Code Status:   Code Status: Full Code  Family Communication: plan of care discussed with patient at bedside.  I have called and informed patient's brother, and have explained the whole situation and his current status.  I answered all of his questions to the best of my ability and he had no further questionss. He would like to be here  When patient is getting physical therapy tomorrow.  Status is: Inpatient Remains inpatient appropriate because:Inpatient level of care appropriate due to severity of illness  Dispo: The patient is from:Home.  Anticipated d/c is MA:UQJF w/ HH. w bsc, Walker, hosp bed.              Anticipated d/c date HL:[id number] d/c 1-2 days if orthostasis improves and if okay with cardiology. Patient currently is not medically stable to d/c. Consultants:see note  Procedures:see note  Culture/Microbiology    Component Value Date/Time   SDES URINE,  CLEAN CATCH 01/30/2015 1650   SPECREQUEST NONE 01/30/2015 1650   CULT NO GROWTH Performed at [facility]  01/30/2015 1650   REPTSTATUS 02/01/2015 FINAL 01/30/2015 1650    Other culture-see note  Medications: Scheduled Meds: . atorvastatin  20 mg Oral Daily  . dexamethasone  4 mg Oral Q12H  . feeding supplement (ENSURE ENLIVE)  237 mL Oral BID BM  . insulin aspart  0-20 Units Subcutaneous TID WC  . insulin aspart  0-5 Units Subcutaneous QHS  . insulin aspart  6 Units Subcutaneous TID WC  . insulin glargine  25 Units Subcutaneous BID  . midodrine  10 mg Oral TID WC  . sodium chloride  1 g Oral TID WC  . warfarin  3.75 mg Oral ONCE-1600  . Warfarin - Pharmacist Dosing Inpatient   Does not apply q1600   Continuous Infusions:  Antimicrobials: Anti-infectives (From admission, onward)   None     Objective: Vitals: Today's Vitals   07/23/20 0439 07/23/20 0840 07/23/20 1014 07/23/20 1319  BP: 130/90  (!) 153/103 (!) 163/97  Pulse: 89  91 91  Resp: 18  16 18   Temp: 97.8 F (36.6 C)  98.2 F (36.8 C) 97.8 F (36.6 C)  TempSrc: Oral  Oral Oral  SpO2: 100%  100% 100%  Weight:      Height:      PainSc:  0-No pain      Intake/Output Summary (Last 24 hours) at 07/23/2020 1524 Last data filed at 07/23/2020 0437 Gross per 24 hour  Intake 360 ml  Output 1270 ml  Net -910 ml   Filed Weights   07/17/20 0532 07/18/20 0503 07/22/20 1754  Weight: 69.1 kg 65.5 kg 70.3 kg   Weight change:   Intake/Output from previous day: 10/10 0701 - 10/11 0700 In: 960 [P.O.:960] Out: 1270 [Urine:1270] Intake/Output this shift: No intake/output data recorded.  Examination: General exam: AAOX3,old for his age,NAD, weak appearing. HEENT:Oral mucosa moist, Ear/Nose WNL grossly, dentition normal. Respiratory system: bilaterally CLEAR,no wheezing or crackles,no use of accessory muscle Cardiovascular system: S1 & S2 +, No JVD,. Gastrointestinal system: Abdomen soft, NT,ND,  BS+ Nervous System:Alert, awake, moving extremities and grossly nonfocal Extremities: No edema, distal peripheral pulses palpable.  Skin: No rashes,no icterus. MSK: Normal muscle bulk,tone, power  Data Reviewed: I have personally reviewed labs and imaging studies CBC: Recent Labs  Lab 07/18/20 0435 07/20/20 0422 07/21/20 0550 07/22/20 0555 07/23/20 0513  WBC 15.0* 14.4* 16.8* 15.4* 15.6*  HGB 10.4* 10.7* 10.4* 10.1* 10.4*  HCT 33.0* 34.7* 33.2* 31.8* 33.4*  MCV 95.7 96.7 96.5 96.7 96.8  PLT 387 318 325 309 734   Basic Metabolic Panel: Recent Labs  Lab 07/18/20 0435 07/20/20 0422 07/21/20 0550 07/22/20 0555 07/23/20 0513  NA 137 136 136 135 136  K 4.4 4.3 4.4 4.5 4.5  CL 102 100 99 100 99  CO2 27 28 28 27 29   GLUCOSE 149* 158* 158* 151* 164*  BUN 34* 40* 42* 38* 39*  CREATININE 0.96 0.97 0.94 0.91 0.91  CALCIUM 9.5 9.6 9.8 9.4 9.5   GFR: Estimated Creatinine Clearance: 88  mL/min (by C-G formula based on SCr of 0.91 mg/dL). Liver Function Tests: No results for input(s): AST, ALT, ALKPHOS, BILITOT, PROT, ALBUMIN in the last 168 hours. No results for input(s): LIPASE, AMYLASE in the last 168 hours. No results for input(s): AMMONIA in the last 168 hours. Coagulation Profile: Recent Labs  Lab 07/19/20 0447 07/20/20 0422 07/21/20 0550 07/22/20 0555 07/23/20 0513  INR 2.7* 2.7* 3.1* 2.5* 2.2*   Cardiac Enzymes: No results for input(s): CKTOTAL, CKMB, CKMBINDEX, TROPONINI in the last 168 hours. BNP (last 3 results) No results for input(s): PROBNP in the last 8760 hours. HbA1C: No results for input(s): HGBA1C in the last 72 hours. CBG: Recent Labs  Lab 07/22/20 1142 07/22/20 1707 07/22/20 2047 07/23/20 0741 07/23/20 1157  GLUCAP 191* 208* 195* 166* 199*   Lipid Profile: No results for input(s): CHOL, HDL, LDLCALC, TRIG, CHOLHDL, LDLDIRECT in the last 72 hours. Thyroid Function Tests: No results for input(s): TSH, T4TOTAL, FREET4, T3FREE, THYROIDAB in the  last 72 hours. Anemia Panel: No results for input(s): VITAMINB12, FOLATE, FERRITIN, TIBC, IRON, RETICCTPCT in the last 72 hours. Sepsis Labs: No results for input(s): PROCALCITON, LATICACIDVEN in the last 168 hours.  No results found for this or any previous visit (from the past 240 hour(s)).   Radiology Studies: No results found.   LOS: 14 days   Antonieta Pert, MD [facility]  07/23/2020, 3:24 PM  In conjunction.

## 2020-07-23 NOTE — Care Management Important Message (Signed)
Important Message  Patient Details IM Letter given to the Patient Name: Dennis [name]. MRN: [id number] Date of Birth: 03-Dec-1960   Medicare Important Message Given:  Yes     Kerin Salen 07/23/2020, 11:43 AM

## 2020-07-24 DIAGNOSIS — R2681 Unsteadiness on feet: Secondary | ICD-10-CM | POA: Diagnosis not present

## 2020-07-24 DIAGNOSIS — Z95810 Presence of automatic (implantable) cardiac defibrillator: Secondary | ICD-10-CM | POA: Diagnosis not present

## 2020-07-24 DIAGNOSIS — I5022 Chronic systolic (congestive) heart failure: Secondary | ICD-10-CM | POA: Diagnosis not present

## 2020-07-24 DIAGNOSIS — I951 Orthostatic hypotension: Secondary | ICD-10-CM | POA: Diagnosis not present

## 2020-07-24 LAB — PROTIME-INR
INR: 1.9 — ABNORMAL HIGH (ref 0.8–1.2)
Prothrombin Time: 20.8 seconds — ABNORMAL HIGH (ref 11.4–15.2)

## 2020-07-24 LAB — GLUCOSE, CAPILLARY
Glucose-Capillary: 177 mg/dL — ABNORMAL HIGH (ref 70–99)
Glucose-Capillary: 249 mg/dL — ABNORMAL HIGH (ref 70–99)
Glucose-Capillary: 208 mg/dL — ABNORMAL HIGH (ref 70–99)
Glucose-Capillary: 197 mg/dL — ABNORMAL HIGH (ref 70–99)

## 2020-07-24 MED ORDER — SODIUM CHLORIDE 1 G PO TABS
2.0000 g | ORAL_TABLET | Freq: Three times a day (TID) | ORAL | Status: DC
  Administered 2020-07-24 – 2020-07-25 (×4): 2 g via ORAL
  Filled 2020-07-24 (×4): qty 2

## 2020-07-24 MED ORDER — WARFARIN SODIUM 5 MG PO TABS
7.5000 mg | ORAL_TABLET | Freq: Once | ORAL | Status: AC
  Administered 2020-07-24: 7.5 mg via ORAL
  Filled 2020-07-24: qty 1

## 2020-07-24 MED ORDER — SODIUM CHLORIDE 0.9 % IV BOLUS
500.0000 mL | Freq: Once | INTRAVENOUS | Status: AC
  Administered 2020-07-24: 500 mL via INTRAVENOUS

## 2020-07-24 NOTE — Progress Notes (Addendum)
Progress Note  Patient Name: Dennis [name]. Date of Encounter: 07/24/2020  [facility] Cardiologist: Glori Bickers, MD   Subjective   No dizziness today , no chest pain or SOB  Inpatient Medications    Scheduled Meds:  atorvastatin  20 mg Oral Daily   dexamethasone  4 mg Oral Q12H   feeding supplement (ENSURE ENLIVE)  237 mL Oral BID BM   insulin aspart  0-20 Units Subcutaneous TID WC   insulin aspart  0-5 Units Subcutaneous QHS   insulin aspart  6 Units Subcutaneous TID WC   insulin glargine  25 Units Subcutaneous BID   midodrine  10 mg Oral TID WC   sodium chloride  1 g Oral TID WC   Warfarin - Pharmacist Dosing Inpatient   Does not apply q1600   Continuous Infusions:   PRN Meds: acetaminophen **OR** acetaminophen (TYLENOL) oral liquid 160 mg/5 mL **OR** acetaminophen, ondansetron (ZOFRAN) IV, polyethylene glycol, senna-docusate   Vital Signs    Vitals:   07/23/20 1014 07/23/20 1319 07/23/20 2032 07/24/20 0607  BP: (!) 153/103 (!) 163/97 (!) 160/100 (!) 135/95  Pulse: 91 91 92 86  Resp: 16 18  20   Temp: 98.2 F (36.8 C) 97.8 F (36.6 C) 98.2 F (36.8 C) 98.1 F (36.7 C)  TempSrc: Oral Oral Oral Oral  SpO2: 100% 100% 100% 100%  Weight:    67.4 kg  Height:        Intake/Output Summary (Last 24 hours) at 07/24/2020 0937 Last data filed at 07/24/2020 0826 Gross per 24 hour  Intake --  Output 1520 ml  Net -1520 ml   Last 3 Weights 07/24/2020 07/22/2020 07/18/2020  Weight (lbs) 148 lb 9.6 oz 154 lb 15.7 oz 144 lb 8 oz  Weight (kg) 67.405 kg 70.3 kg 65.545 kg  Some encounter information is confidential and restricted. Go to Review Flowsheets activity to see all data.      Telemetry   SR - Personally Reviewed  ECG    No new - Personally Reviewed  Physical Exam   GEN: No acute distress.   Neck: No JVD Cardiac: RRR, no murmurs, rubs, or gallops.  Respiratory: Clear to auscultation bilaterally. GI: Soft, nontender, non-distended  MS: No  edema; No deformity. Neuro:  Nonfocal  Psych: Normal affect   Labs    High Sensitivity Troponin:  No results for input(s): TROPONINIHS in the last 720 hours.    Chemistry Recent Labs  Lab 07/21/20 0550 07/22/20 0555 07/23/20 0513  NA 136 135 136  K 4.4 4.5 4.5  CL 99 100 99  CO2 28 27 29   GLUCOSE 158* 151* 164*  BUN 42* 38* 39*  CREATININE 0.94 0.91 0.91  CALCIUM 9.8 9.4 9.5  GFRNONAA >60 >60 >60  ANIONGAP 9 8 8      Hematology Recent Labs  Lab 07/21/20 0550 07/22/20 0555 07/23/20 0513  WBC 16.8* 15.4* 15.6*  RBC 3.44* 3.29* 3.45*  HGB 10.4* 10.1* 10.4*  HCT 33.2* 31.8* 33.4*  MCV 96.5 96.7 96.8  MCH 30.2 30.7 30.1  MCHC 31.3 31.8 31.1  RDW 15.5 15.5 15.6*  PLT 325 309 285    BNPNo results for input(s): BNP, PROBNP in the last 168 hours.   DDimer No results for input(s): DDIMER in the last 168 hours.   Radiology    No results found.  Cardiac Studies   07/09/20  Echo IMPRESSIONS     1. Since the last exam on 12/25/2019 LVEF has slightly improved from  15-20% to 25-30% with diffuse hypokinesis.   2. Left ventricular ejection fraction, by estimation, is 25 to 30%. The  left ventricle has severely decreased function. The left ventricle  demonstrates global hypokinesis. The left ventricular internal cavity size  was moderately dilated. There is mild  concentric left ventricular hypertrophy. Left ventricular diastolic  parameters are consistent with Grade II diastolic dysfunction  (pseudonormalization). Elevated left atrial pressure.   3. Right ventricular systolic function is normal. The right ventricular  size is normal. There is normal pulmonary artery systolic pressure.   4. A small pericardial effusion is present. The pericardial effusion is  posterior to the left ventricle.   5. The mitral valve is normal in structure. Mild mitral valve  regurgitation. No evidence of mitral stenosis.   6. The aortic valve is normal in structure. Aortic valve  regurgitation is  trivial. No aortic stenosis is present.   7. The inferior vena cava is normal in size with greater than 50%  respiratory variability, suggesting right atrial pressure of 3 mmHg.   FINDINGS   Left Ventricle: Left ventricular ejection fraction, by estimation, is 25  to 30%. The left ventricle has severely decreased function. The left  ventricle demonstrates global hypokinesis. The left ventricular internal  cavity size was moderately dilated.  There is mild concentric left ventricular hypertrophy. Left ventricular  diastolic parameters are consistent with Grade II diastolic dysfunction  (pseudonormalization). Elevated left atrial pressure.   Right Ventricle: The right ventricular size is normal. No increase in  right ventricular wall thickness. Right ventricular systolic function is  normal. There is normal pulmonary artery systolic pressure. The tricuspid  regurgitant velocity is 1.77 m/s, and   with an assumed right atrial pressure of 8 mmHg, the estimated right  ventricular systolic pressure is 78.9 mmHg.   Left Atrium: Left atrial size was normal in size.   Right Atrium: Right atrial size was normal in size.   Pericardium: A small pericardial effusion is present. The pericardial  effusion is posterior to the left ventricle.   Mitral Valve: The mitral valve is normal in structure. Mild mitral valve  regurgitation. No evidence of mitral valve stenosis.   Tricuspid Valve: The tricuspid valve is normal in structure. Tricuspid  valve regurgitation is mild . No evidence of tricuspid stenosis.   Aortic Valve: The aortic valve is normal in structure. Aortic valve  regurgitation is trivial. No aortic stenosis is present.   Pulmonic Valve: The pulmonic valve was normal in structure. Pulmonic valve  regurgitation is trivial. No evidence of pulmonic stenosis.   Aorta: The aortic root is normal in size and structure.   Venous: The inferior vena cava is normal in size  with greater than 50%  respiratory variability, suggesting right atrial pressure of 3 mmHg.   IAS/Shunts: No atrial level shunt detected by color flow Doppler.   Additional Comments: A pacer wire is visualized in the right atrium and  right ventricle.       Patient Profile     59 y.o. 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  He was found to have brain metastases.  Hospitalization was complicated by multiple episodes of micturition syncope and orthostasis..  Assessment & Plan  1. Orthostatsis:  Needs to ambulate before d/c improved with small fluid bolus. Continue midodrin 10 mg TID and also on dexamethazone  --pt is neg [id number]  --wt down from 70.3 to 67.4 Kg.  --yesterday BP from 153/103 to 89/69 with standing.  --no dizziness today, orthostatic BP pending   2. DCM:  EF slightly improved on echo 07/09/20 not on GDMT due to orthostasis  Has st jude ICD Now off entresto, coreg    3. CAD:  Known occulded LAD with collateral flow no chest pain ECG ok continue statin    4. Cocaine:  Positive on admission    5. Cancer:  Small cell lung with brain mets last MRI 07/20/20 with resolution of right frontal metastasis but new lesions in ventral pons       For questions or updates, please contact [facility] Please consult [email or web address] for contact info under        Signed, Cecilie Kicks, NP  07/24/2020, 9:37 AM    Symptoms improved see recs above Suspect some autonomic dysfunction from brain mets. [employer] Jude AICD in place. Not on GDMT due to low BP Ok to d/c when ambulatory and  systolic BP over 90 mmHg   Jenkins Rouge MD Leonidas Romberg

## 2020-07-24 NOTE — Progress Notes (Signed)
Occupational Therapy Treatment Patient Details Name: Dennis [name]. MRN: [id number] DOB: 1961/06/29 Today's Date: 07/24/2020    History of present illness 59 yo male admitted with unsteady gait, slurred speech. Hx of CHF, DM, CVA, pacemaker, MDD, metastatic lung ca, polysubstance abuse   OT comments  Patient min G for functional ambulation to/from bathroom for sink side ADLs. Min G to brush teeth, wash face and perform perianal hygiene standing sink side for safety, patient denies any dizziness throughout session. Mild unsteadiness with ambulation back to recliner, and cues for safety to reach back for chair arm when sitting. Patient hopeful may get to D/C home today.   Follow Up Recommendations  Home health OT;Supervision/Assistance - 24 hour;SNF    Equipment Recommendations  3 in 1 bedside commode       Precautions / Restrictions Precautions Precautions: Fall Precaution Comments: ABD binder and TED hose for BP Restrictions Weight Bearing Restrictions: No       Mobility Bed Mobility Overal bed mobility: Modified Independent                Transfers Overall transfer level: Needs assistance Equipment used: None Transfers: Sit to/from Stand Sit to Stand: Min guard         General transfer comment: min G for safety due to mild unsteadiness    Balance Overall balance assessment: Needs assistance;History of Falls Sitting-balance support: No upper extremity supported;Feet supported Sitting balance-Leahy Scale: Good       Standing balance-Leahy Scale: Fair Standing balance comment: static stand with close supervision for safety                           ADL either performed or assessed with clinical judgement   ADL Overall ADL's : Needs assistance/impaired     Grooming: Min guard;Standing;Wash/dry hands;Wash/dry face;Oral care Grooming Details (indicate cue type and reason): performed oral care and face washing standing at sink with therapist  providing min guard for safety.     Lower Body Bathing: Min guard;Sit to/from stand Lower Body Bathing Details (indicate cue type and reason): washed perianal area standing at sink with min G for safety         Toilet Transfer: Min guard Toilet Transfer Details (indicate cue type and reason): min guard for transfer to recliner chair         Functional mobility during ADLs: Min guard General ADL Comments: abdominal binder donned at beginning of session, no reports of dizziness throughout                Cognition Arousal/Alertness: Awake/alert Behavior During Therapy: WFL for tasks assessed/performed Overall Cognitive Status: Within Functional Limits for tasks assessed                                                     Pertinent Vitals/ Pain       Pain Assessment: No/denies pain         Frequency  Min 2X/week        Progress Toward Goals  OT Goals(current goals can now be found in the care plan section)  Progress towards OT goals: Progressing toward goals  Acute Rehab OT Goals Patient Stated Goal: get well OT Goal Formulation: With patient Time For Goal Achievement: 08/07/20 Potential to Achieve Goals: Good ADL Goals Pt  Will Perform Grooming: with supervision;standing Pt Will Perform Upper Body Dressing: with supervision;sitting Pt Will Transfer to Toilet: with supervision;bedside commode Pt Will Perform Toileting - Clothing Manipulation and hygiene: with supervision;sit to/from stand  Plan Discharge plan remains appropriate       AM-PAC OT "6 Clicks" Daily Activity     Outcome Measure   Help from another person eating meals?: A Little Help from another person taking care of personal grooming?: A Little Help from another person toileting, which includes using toliet, bedpan, or urinal?: A Little Help from another person bathing (including washing, rinsing, drying)?: A Little Help from another person to put on and taking off  regular upper body clothing?: A Little Help from another person to put on and taking off regular lower body clothing?: A Little 6 Click Score: 18    End of Session  OT Visit Diagnosis: Unsteadiness on feet (R26.81);Other abnormalities of gait and mobility (R26.89);Muscle weakness (generalized) (M62.81);History of falling (Z91.81)   Activity Tolerance Patient tolerated treatment well   Patient Left in chair;with call bell/phone within reach;with chair alarm set   Nurse Communication Mobility status        Time: 9371-6967 OT Time Calculation (min): 16 min  Charges: OT General Charges $OT Visit: 1 Visit OT Treatments $Self Care/Home Management : 8-22 mins  Delbert Phenix OT OT pager: [place] 07/24/2020, 10:50 AM

## 2020-07-24 NOTE — TOC Progression Note (Signed)
Transition of Care (TOC) - Progression Note    Patient Details  Name: Dennis [name]. MRN: [id number] Date of Birth: [birth year]  Transition of Care [facility]) CM/SW Contact  Joaquin Courts, RN Phone Number: 07/24/2020, 1:00 PM  Clinical Narrative:   HHPT/OT/Social work arranged with [facility]. [employer] to provide hospital bed, wheelchair, and 3in1.       Expected Discharge Plan: [place] Barriers to Discharge: Continued Medical Work up  Expected Discharge Plan and Services Expected Discharge Plan: [place] Choice: Home Health                   DME Arranged: High strength lightweight manual wheelchair with seat cushion, Hospital bed, 3-N-1 DME Agency: [employer] Date DME Agency Contacted: 07/24/20 Time DME Agency Contacted: [id number] Representative spoke with at DME Agency: Freda Munro HH Arranged: PT, OT, Social Work [employer] Agency: [facility] Date [place]: 07/24/20 Time [place]: 48 Representative spoke with at [place]: Amy   Social Determinants of Health ([place]) Interventions    Readmission Risk Interventions No flowsheet data found.

## 2020-07-24 NOTE — Progress Notes (Addendum)
[place] for Warfarin Indication: advanced (noncompaction) cardiomyopathy  No Known Allergies  Patient Measurements: Height: 6' (182.9 cm) Weight: 67.4 kg (148 lb 9.6 oz) IBW/kg (Calculated) : 77.6  Vital Signs: Temp: 98.1 F (36.7 C) (10/12 0607) Temp Source: Oral (10/12 0607) BP: 135/95 (10/12 0607) Pulse Rate: 86 (10/12 0607)  Labs: Recent Labs    07/22/20 0555 07/23/20 0513 07/24/20 0423  HGB 10.1* 10.4*  --   HCT 31.8* 33.4*  --   PLT 309 285  --   LABPROT 26.3* 23.9* 20.8*  INR 2.5* 2.2* 1.9*  CREATININE 0.91 0.91  --     Estimated Creatinine Clearance: 84.4 mL/min (by C-G formula based on SCr of 0.91 mg/dL).  Medications:  Scheduled:  . atorvastatin  20 mg Oral Daily  . dexamethasone  4 mg Oral Q12H  . feeding supplement (ENSURE ENLIVE)  237 mL Oral BID BM  . insulin aspart  0-20 Units Subcutaneous TID WC  . insulin aspart  0-5 Units Subcutaneous QHS  . insulin aspart  6 Units Subcutaneous TID WC  . insulin glargine  25 Units Subcutaneous BID  . midodrine  10 mg Oral TID WC  . sodium chloride  1 g Oral TID WC  . 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 Per med rec and chart records on visit on 9/3, pt is on Warfarin 7.5 mg On Tues/Thur/Sat and 3.75 mg on all other days. Baseline INR subtherapeutic on admission; possible noncompliance.  Today, 07/24/20   INR now slightly subtherapeutic  Hgb and Plts stable  No major drug-drug interactions with warfarin; continues on concomitant ASA from PTA  Goal of Therapy:  INR 2-3 Monitor platelets by anticoagulation protocol: Yes   Plan:   Warfarin 7.5 mg today  Likely don't need to start bridging immediately with subtherapeutic INR, but may need to consider if INR doesn't become therapeutic in 1-2 days  Daily INR    For discharge, recommend resuming PTA dosing with INR check in 3-5 days  Monitor for signs and symptoms of bleeding  Reuel Boom, PharmD, BCPS [phone] 07/24/2020, 12:14 PM

## 2020-07-24 NOTE — Progress Notes (Signed)
PROGRESS NOTE    Dennis [name].  WJX:[id number] DOB: 1961-02-01 DOA: 07/07/2020 PCP: [name], MD   Chief Complaint  Patient presents with  . 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  CT head without contrast without acute finding.  Neurology consulted.  Could not do MRI due to ICD.  Repeat CT head with contrast did not reveal CVA but concern about infiltrating enhancement in ventral pons and proximal medulla concerning of metastatic disease.  EEG without epileptiform or seizure activity.  Patient with significant orthostatic hypotension even after holding cardiac meds, starting midodrine, TED hose and abdominal binder.  Initially there was some vasovagal element to his orthostasis but vasovagal issues seem to have resolved.  Echo with EF of 25 to 30% (previously 15 to 20%), diffuse hypokinesis G2-DD.  Cardiology following.  Radiation oncology consulted by oncology, and hoping to do MRI brain for better info about infiltrating enhancement in midbrain. However, patient has AICD which is " MRI conditional" and " not FDA approved". Cardiology willing to turn off AICD if radiology is willing to do MRI brain. Radiation oncology may order special CT with thin cuts .  Based on location MRI has been recommended operatively with meeting by his radiation oncologist Dr Mauri Reading. Patient underwent MRI of the brain after turning pacer off- "Newly seen abnormal enhancement affecting the ventral pons from the pontomedullary junction to the mid brain. This is presumed represent metastatic disease" Patient continues to have lightheadedness and near syncopal episode.seen b ycards- s/p bolus nss 500 ml 10/10 10/11-added salt tablet for ongoing  orthostatics   Subjective: Patient feels overall much improving Able to work with PT and ambulating the room Blood pressure on standing was down to 70s again from 153 lying > 105 sitting. Not comfortable yet for discharge today, hoping for discharge tomorrow.  Assessment & Plan:  Slurred speech/LUE paresthesia/unsteady gait in patient with prior right MCA CVA/acute metabolic encephalopathy on admission- CT head with and without contrast did not reveal CVA but possible pontine and medullary infiltrating lesion concerning for metastatic disease-underwent MRI of the brain after turning a pacemaker off 10/8 that shows abnormal enhancement affecting the ventral pons from the pontomedullary junction to the mid-brain-this is presumed to represent metastatic disease.  No acute stroke. Echo EF of 25 to 30% (improved). EEG no seizure activity, UDS positive for cocaine.HBA1c 10.5%.  LDL 77.  Overall neurologically intact.   Recurrent syncope/orthostatic hypotension: Syncope due to severe orthostasis.  Multiple episodes, and micturition syncope and orthostasis.  Orthostatic vitals still abnormal.  Increase salt tabs to 2 g 3 times daily, continue midodrine 10 mg 3 times daily, lower extremities compression stocking and abdominal binder.  Wt 70.3> 67.4 kg and total Neg 6.5 liter.  Fluid overload on exam.  Will give 500 ml NS.  Orthostasis likely from autonomic dysfunction versus from brain mets. Discussed with Cardio Dr Johnsie Cancel and did not advise Florinef or Northera for orthostatic hypotension due to his poor EF/severe CHF.Encourage ambulation with PT OT.  In the meantime we are setting of hospital bed wheelchair, [facility], walker equipment for his house and he will be going to his mother's house where his brother will take care of him.  Patient understand that he is is being  tried on maximal medical therapy for his orthostatic hypotension, if remains persistent he may need to change his lifestyle.  Cautious with  activity, continue on fall precaution.  He is on anticoagulation.  Small cell lung cancer with brain mets status post whole brain radiation:CT head with and without contrast concerning for pontine and medullary infiltrates so on Decadron 4 mg every 12 hours per oncology .Discussed with Dr Mickeal Skinner and recommend MRI brain with and without contrast, and cardiology has turned off pacer and had MRI 10/8- shows:"Newly seen abnormal enhancement affecting the ventral pons from the pontomedullary junction to the mid brain. This is presumed represent metastatic disease. The possibility of treatment effect is considered but felt unlikely. Resolution of the previously seen right frontal metastasis. Old right MCA territory infarction"Spoke w/ Dr Mickeal Skinner and he has arranged outpatient follow-up on 10/15 to further therapy.    Chronic systolic CHF status post AICD March/2021: Not on guideline directed heart failure therapy due to his hypotension and syncope.  Appreciate cardiology input on board  CAD history occluded LAD with collaterals/HLD/HTN: No chest pain.  Stable, continue aspirin, statin Coumadin.   History of Rt MCA CVA/cardiomyopathy on Coumadin.If he is persistently orthostatic her fall risk will increase his risk and benefits. He will need to f/u with whit doctor   Polysubstance abuse/Cocaine abuse: Cessation advised.He is not forthcoming about his drug use.  Uncontrolled type 2 diabetes mellitus with hyperglycemia, with long-term current use of insulin. hba1c 10.5, blood sugar has stabilized no more hypoglycemia after adjusting insulin.  Continue current 25 units Lantus twice daily and 6 units premeal and sliding scale.   Recent Labs  Lab 07/23/20 1157 07/23/20 1638 07/23/20 2034 07/24/20 0755 07/24/20 1125  GLUCAP 199* 196* 172* 177* 249*   Lethargy/possible delirium-AAAOX3  Leukocytosis likely from steroid remains stable.Afebrile. Recent Labs  Lab 07/18/20 0435 07/20/20 0422 07/21/20 0550  07/22/20 0555 07/23/20 0513  WBC 15.0* 14.4* 16.8* 15.4* 15.6*   Debility/deconditioning/failure to thrive:Continue to work with PT/OT. CM working on to set up  [facility], hospital bed, walker. Ordered Wheel Chair today  Anemia of chronic disease: Hemoglobin overall stable.  Monitor Recent Labs  Lab 07/18/20 0435 07/20/20 0422 07/21/20 0550 07/22/20 0555 07/23/20 0513  HGB 10.4* 10.7* 10.4* 10.1* 10.4*  HCT 33.0* 34.7* 33.2* 31.8* 33.4*   Diet Order            Diet Carb Modified Fluid consistency: Thin; Room service appropriate? Yes with Assist  Diet effective now                 Nutrition Problem: Increased nutrient needs Etiology: cancer and cancer related treatments (CHF, substance abuse) Signs/Symptoms: estimated needs Interventions: Ensure Enlive (each supplement provides 350kcal and 20 grams of protein)  Body mass index is 20.15 kg/m.  DVT prophylaxis: Place TED hose Start: 07/10/20 1054 SCD's Start: 07/08/20 0127 Code Status:   Code Status: Full Code  Family Communication: plan of care discussed with patient at bedside.  I had called and updated patient's brother as well.   Status is: Inpatient Remains inpatient appropriate because:Inpatient level of care appropriate due to severity of illness  Dispo: The patient is from:Home.              Anticipated d/c is NU:UVOZ w/ HH. w bsc, Gilford Rile, hosp bed, Wheel Chair.              Anticipated d/c date DG:[id number] d/c 1-2 days if orthostasis improves above 90 and if oky w/  cardio Patient currently is not medically stable to d/c. Consultants:see note  Procedures:see note  Culture/Microbiology    Component Value Date/Time   SDES URINE, CLEAN CATCH 01/30/2015 1650   SPECREQUEST NONE 01/30/2015 1650   CULT NO GROWTH Performed at [facility]  01/30/2015 1650   REPTSTATUS 02/01/2015 FINAL 01/30/2015 1650    Other culture-see note  Medications: Scheduled Meds: . atorvastatin  20 mg Oral Daily  .  dexamethasone  4 mg Oral Q12H  . feeding supplement (ENSURE ENLIVE)  237 mL Oral BID BM  . insulin aspart  0-20 Units Subcutaneous TID WC  . insulin aspart  0-5 Units Subcutaneous QHS  . insulin aspart  6 Units Subcutaneous TID WC  . insulin glargine  25 Units Subcutaneous BID  . midodrine  10 mg Oral TID WC  . sodium chloride  2 g Oral TID WC  . warfarin  7.5 mg Oral ONCE-1600  . Warfarin - Pharmacist Dosing Inpatient   Does not apply q1600   Continuous Infusions:  Antimicrobials: Anti-infectives (From admission, onward)   None     Objective: Vitals: Today's Vitals   07/23/20 2032 07/24/20 0607 07/24/20 0808 07/24/20 1429  BP: (!) 160/100 (!) 135/95  135/85  Pulse: 92 86  92  Resp:  20  16  Temp: 98.2 F (36.8 C) 98.1 F (36.7 C)  98 F (36.7 C)  TempSrc: Oral Oral  Oral  SpO2: 100% 100%  98%  Weight:  67.4 kg    Height:      PainSc:   0-No pain     Intake/Output Summary (Last 24 hours) at 07/24/2020 1506 Last data filed at 07/24/2020 1432 Gross per 24 hour  Intake 720 ml  Output 1720 ml  Net -1000 ml   Filed Weights   07/18/20 0503 07/22/20 1754 07/24/20 0607  Weight: 65.5 kg 70.3 kg 67.4 kg   Weight change: -2.895 kg  Intake/Output from previous day: 10/11 0701 - 10/12 0700 In: -  Out: 1120 [Urine:1120] Intake/Output this shift: Total I/O In: 720 [P.O.:720] Out: 600 [Urine:600]  Examination: General exam: AAO x 3, old for age,NAD, weak appearing. HEENT:Oral mucosa moist, Ear/Nose WNL grossly, dentition normal. Respiratory system: bilaterally clear,no wheezing or crackles,no use of accessory muscle Cardiovascular system: S1 & S2 +, No JVD,. Gastrointestinal system: Abdomen soft, NT,ND, BS+ Nervous System:Alert, awake, moving extremities and grossly nonfocal Extremities: No edema, distal peripheral pulses palpable.  Skin: No rashes,no icterus. MSK: Normal muscle bulk,tone, power  Data Reviewed: I have personally reviewed labs and imaging  studies CBC: Recent Labs  Lab 07/18/20 0435 07/20/20 0422 07/21/20 0550 07/22/20 0555 07/23/20 0513  WBC 15.0* 14.4* 16.8* 15.4* 15.6*  HGB 10.4* 10.7* 10.4* 10.1* 10.4*  HCT 33.0* 34.7* 33.2* 31.8* 33.4*  MCV 95.7 96.7 96.5 96.7 96.8  PLT 387 318 325 309 478   Basic Metabolic Panel: Recent Labs  Lab 07/18/20 0435 07/20/20 0422 07/21/20 0550 07/22/20 0555 07/23/20 0513  NA 137 136 136 135 136  K 4.4 4.3 4.4 4.5 4.5  CL 102 100 99 100 99  CO2 27 28 28 27 29   GLUCOSE 149* 158* 158* 151* 164*  BUN 34* 40* 42* 38* 39*  CREATININE 0.96 0.97 0.94 0.91 0.91  CALCIUM 9.5 9.6 9.8 9.4 9.5   GFR: Estimated Creatinine Clearance: 84.4 mL/min (by C-G formula based on SCr of 0.91 mg/dL). Liver Function Tests: No results for input(s): AST, ALT, ALKPHOS, BILITOT, PROT, ALBUMIN in the last 168 hours. No results  for input(s): LIPASE, AMYLASE in the last 168 hours. No results for input(s): AMMONIA in the last 168 hours. Coagulation Profile: Recent Labs  Lab 07/20/20 0422 07/21/20 0550 07/22/20 0555 07/23/20 0513 07/24/20 0423  INR 2.7* 3.1* 2.5* 2.2* 1.9*   Cardiac Enzymes: No results for input(s): CKTOTAL, CKMB, CKMBINDEX, TROPONINI in the last 168 hours. BNP (last 3 results) No results for input(s): PROBNP in the last 8760 hours. HbA1C: No results for input(s): HGBA1C in the last 72 hours. CBG: Recent Labs  Lab 07/23/20 1157 07/23/20 1638 07/23/20 2034 07/24/20 0755 07/24/20 1125  GLUCAP 199* 196* 172* 177* 249*   Lipid Profile: No results for input(s): CHOL, HDL, LDLCALC, TRIG, CHOLHDL, LDLDIRECT in the last 72 hours. Thyroid Function Tests: No results for input(s): TSH, T4TOTAL, FREET4, T3FREE, THYROIDAB in the last 72 hours. Anemia Panel: No results for input(s): VITAMINB12, FOLATE, FERRITIN, TIBC, IRON, RETICCTPCT in the last 72 hours. Sepsis Labs: No results for input(s): PROCALCITON, LATICACIDVEN in the last 168 hours.  No results found for this or any  previous visit (from the past 240 hour(s)).   Radiology Studies: No results found.   LOS: 15 days   Antonieta Pert, MD [facility]  07/24/2020, 3:06 PM  In conjunction.

## 2020-07-25 DIAGNOSIS — I5022 Chronic systolic (congestive) heart failure: Secondary | ICD-10-CM | POA: Diagnosis not present

## 2020-07-25 DIAGNOSIS — Z95810 Presence of automatic (implantable) cardiac defibrillator: Secondary | ICD-10-CM | POA: Diagnosis not present

## 2020-07-25 DIAGNOSIS — R2681 Unsteadiness on feet: Secondary | ICD-10-CM | POA: Diagnosis not present

## 2020-07-25 DIAGNOSIS — C3490 Malignant neoplasm of unspecified part of unspecified bronchus or lung: Secondary | ICD-10-CM

## 2020-07-25 DIAGNOSIS — F1410 Cocaine abuse, uncomplicated: Secondary | ICD-10-CM | POA: Diagnosis not present

## 2020-07-25 LAB — GLUCOSE, CAPILLARY
Glucose-Capillary: 300 mg/dL — ABNORMAL HIGH (ref 70–99)
Glucose-Capillary: 77 mg/dL (ref 70–99)
Glucose-Capillary: 232 mg/dL — ABNORMAL HIGH (ref 70–99)

## 2020-07-25 LAB — PROTIME-INR
INR: 2.4 — ABNORMAL HIGH (ref 0.8–1.2)
Prothrombin Time: 25.4 seconds — ABNORMAL HIGH (ref 11.4–15.2)

## 2020-07-25 MED ORDER — MIDODRINE HCL 10 MG PO TABS: 10.0000 mg | ORAL_TABLET | Freq: Three times a day (TID) | ORAL | 2 refills | Status: AC

## 2020-07-25 MED ORDER — PROCHLORPERAZINE MALEATE 10 MG PO TABS: 10.0000 mg | ORAL_TABLET | Freq: Four times a day (QID) | ORAL | 1 refills | Status: DC | PRN

## 2020-07-25 MED ORDER — LANTUS SOLOSTAR 100 UNIT/ML ~~LOC~~ SOPN: 25.0000 [IU] | PEN_INJECTOR | Freq: Two times a day (BID) | SUBCUTANEOUS | 2 refills | Status: DC

## 2020-07-25 MED ORDER — WARFARIN SODIUM 2.5 MG PO TABS
2.5000 mg | ORAL_TABLET | Freq: Once | ORAL | Status: AC
  Administered 2020-07-25: 2.5 mg via ORAL
  Filled 2020-07-25: qty 1

## 2020-07-25 MED ORDER — ENSURE ENLIVE PO LIQD: 237.0000 mL | Freq: Two times a day (BID) | ORAL | 0 refills | Status: AC

## 2020-07-25 MED ORDER — DEXAMETHASONE 4 MG PO TABS: 4.0000 mg | ORAL_TABLET | Freq: Two times a day (BID) | ORAL | 1 refills | Status: DC

## 2020-07-25 MED ORDER — INSULIN LISPRO (1 UNIT DIAL) 100 UNIT/ML (KWIKPEN): 6.0000 [IU] | PEN_INJECTOR | Freq: Three times a day (TID) | SUBCUTANEOUS | 1 refills | Status: AC

## 2020-07-25 MED ORDER — SODIUM CHLORIDE 1 G PO TABS: 2.0000 g | ORAL_TABLET | Freq: Three times a day (TID) | ORAL | 1 refills | Status: DC

## 2020-07-25 NOTE — Progress Notes (Signed)
Pt off unit via wheelchair with staff and pt brother. DC instructions reviewed with pt and brother. Verbalized understanding.

## 2020-07-25 NOTE — Progress Notes (Signed)
Chaplain offered follow up visit.  Patient sitting upright in chair, and even more alert and talkative today.  Discussed [employer] football in 72s.  Patient played for [employer] (until breaking his arm) and then went to [employer] for two years to study accounting.  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  Patient relayed some of what he is learning from doctor, "I was ready to go home but they decided to keep me."  He said his 40th high school reunion is coming soon and he hopes he's well enough to go. Chaplain offered prayer.  Patient's eyes well with emotion as he expressed gratitude. Rev. Tamsen Snider Pager [phone]

## 2020-07-25 NOTE — Progress Notes (Signed)
[place] for Warfarin Indication: advanced (noncompaction) cardiomyopathy  No Known Allergies  Patient Measurements: Height: 6' (182.9 cm) Weight: 67.4 kg (148 lb 9.4 oz) IBW/kg (Calculated) : 77.6  Vital Signs: Temp: 98.4 F (36.9 C) (10/13 0416) Temp Source: Oral (10/13 0416) BP: 132/93 (10/13 0416) Pulse Rate: 92 (10/13 0416)  Labs: Recent Labs    07/23/20 0513 07/24/20 0423 07/25/20 0502  HGB 10.4*  --   --   HCT 33.4*  --   --   PLT 285  --   --   LABPROT 23.9* 20.8* 25.4*  INR 2.2* 1.9* 2.4*  CREATININE 0.91  --   --     Estimated Creatinine Clearance: 84.4 mL/min (by C-G formula based on SCr of 0.91 mg/dL).  Medications:  Scheduled:  . atorvastatin  20 mg Oral Daily  . dexamethasone  4 mg Oral Q12H  . feeding supplement (ENSURE ENLIVE)  237 mL Oral BID BM  . insulin aspart  0-20 Units Subcutaneous TID WC  . insulin aspart  0-5 Units Subcutaneous QHS  . insulin aspart  6 Units Subcutaneous TID WC  . insulin glargine  25 Units Subcutaneous BID  . midodrine  10 mg Oral TID WC  . sodium chloride  2 g Oral TID WC  . Warfarin - Pharmacist Dosing Inpatient   Does not apply q1600    Assessment: Pharmacy is consulted to dose warfarin in 58 yo male with Hx CVA, possibly related to noncompaction cardiomyopathy. Per med rec and chart records on visit on 9/3, pt is on Warfarin 7.5 mg On Tues/Thur/Sat and 3.75 mg on all other days. Baseline INR subtherapeutic on admission; possible noncompliance.  Today, 07/25/20   INR now therapeutic, increasing quickly now  Hgb and Plts stable (last checked 10/11)  No major drug-drug interactions with warfarin; PTA aspirin dc'd by cardiology 10/10  Goal of Therapy:  INR 2-3 Monitor platelets by anticoagulation protocol: Yes   Plan:   Warfarin 2.5 mg today  For discharge, recommend resuming PTA dosing with INR check in 3-5 days  Monitor for signs and symptoms of bleeding  Peggyann Juba, PharmD, BCPS Pharmacy: [phone] 07/25/2020, 7:51 AM

## 2020-07-25 NOTE — Progress Notes (Signed)
Physical Therapy Treatment Patient Details Name: Dennis [name]. MRN: [id number] DOB: [birth year] Today's Date: 07/25/2020    History of Present Illness 59 yo male admitted with unsteady gait, slurred speech. Hx of CHF, DM, CVA, pacemaker, MDD, metastatic lung ca, polysubstance abuse    PT Comments    Pt is pleasant and cooperative. Discussed use of cane, RW as well as w/c at home for safety and fall prevention d/t pt continued orthostasis. Continue to recommend HHPT to reinforce safety and fall prevention  Pt denies dizziness or other symptoms this visit.  BP supine 148/101 BP sitting 109/79 BP standing after 1 minute 88/59 BP after amb ~ 190' 89/71     Follow Up Recommendations  Home health PT;Supervision/Assistance - 24 hour     Equipment Recommendations  Rolling walker with 5" wheels;Wheelchair (measurements PT)    Recommendations for Other Services       Precautions / Restrictions Precautions Precautions: Fall Precaution Comments: ABD binder and TED hose for BP Restrictions Weight Bearing Restrictions: No    Mobility  Bed Mobility Overal bed mobility: Modified Independent                Transfers Overall transfer level: Needs assistance Equipment used: None Transfers: Sit to/from Stand Sit to Stand: Min guard Stand pivot transfers: Min guard       General transfer comment: min/guard for safety d/t orthostasis, strong hx of sudden syncope  Ambulation/Gait Ambulation/Gait assistance: Min guard;Min assist Gait Distance (Feet): 380 Feet Assistive device: None Gait Pattern/deviations: Step-through pattern;Drifts right/left     General Gait Details: unsteady at times d/t baseline LLE weakness/decr coordination, without overt LOB however requires hands on assist throughout d/t drifting/delayed reactions   Stairs             Wheelchair Mobility    Modified Rankin (Stroke Patients Only)       Balance     Sitting balance-Leahy Scale:  Good     Standing balance support: No upper extremity supported Standing balance-Leahy Scale: Fair Standing balance comment: static stand with close supervision for safety                            Cognition Arousal/Alertness: Awake/alert Behavior During Therapy: WFL for tasks assessed/performed Overall Cognitive Status: Within Functional Limits for tasks assessed                                 General Comments: AxO x 3 very nice      Exercises General Exercises - Lower Extremity Ankle Circles/Pumps: AROM;Both;10 reps    General Comments General comments (skin integrity, edema, etc.): reviewed safety adn the need for incr time with transitions OOB at home      Pertinent Vitals/Pain Pain Assessment: No/denies pain    Home Living                      Prior Function            PT Goals (current goals can now be found in the care plan section) Acute Rehab PT Goals Patient Stated Goal: get well PT Goal Formulation: With patient Time For Goal Achievement: 07/29/20 Potential to Achieve Goals: Good Progress towards PT goals: Progressing toward goals    Frequency    Min 3X/week      PT Plan Current plan remains appropriate    Co-evaluation  AM-PAC PT "6 Clicks" Mobility   Outcome Measure  Help needed turning from your back to your side while in a flat bed without using bedrails?: None Help needed moving from lying on your back to sitting on the side of a flat bed without using bedrails?: None Help needed moving to and from a bed to a chair (including a wheelchair)?: A Little Help needed standing up from a chair using your arms (e.g., wheelchair or bedside chair)?: A Little Help needed to walk in hospital room?: A Little Help needed climbing 3-5 steps with a railing? : A Little 6 Click Score: 20    End of Session Equipment Utilized During Treatment: Gait belt Activity Tolerance: Patient tolerated treatment  well Patient left: in chair;with call bell/phone within reach;with chair alarm set Nurse Communication: Mobility status PT Visit Diagnosis: Difficulty in walking, not elsewhere classified (R26.2)     Time: 5852-7782 PT Time Calculation (min) (ACUTE ONLY): 23 min  Charges:  $Gait Training: 8-22 mins $Therapeutic Activity: 8-22 mins                     Baxter Flattery, PT  Acute Rehab Dept ([place]) [phone] Pager [phone]  07/25/2020    [facility] 07/25/2020, 11:39 AM

## 2020-07-25 NOTE — Discharge Summary (Signed)
Physician Discharge Summary  Carlitos Neila Gear. YWV:[id number] DOB: [birth year] DOA: 07/07/2020  PCP: [name], MD  Admit date: 07/07/2020 Discharge date: 07/25/2020  Time spent: 60 minutes  Recommendations for Outpatient Follow-up:  1. Follow-up with Dr. Mickeal Skinner, neuro-oncology 07/27/2020 2. Follow-up with Dr. Lisbeth Renshaw radiation oncology 3. Follow-up with Dr. Lorna Few oncology 4. Follow-up with Dr. Haroldine Laws, cardiology in 2 weeks 5. Follow-up at the Coumadin clinic on 07/30/2020 for PT/INR check. 6. Follow-up with [name], MD in 2 to 3 weeks.  On follow-up patient's diabetes will need to be reassessed as patient's Lantus dose was adjusted to 25 units twice daily and patient placed on NovoLog meal coverage 6 units 3 times daily with meals.   Discharge Diagnoses:  Principal Problem:   Unsteady gait when walking Active Problems:   Long term (current) use of anticoagulants   Automatic implantable cardioverter-defibrillator in situ   Chronic systolic heart failure (HCC)   Hyperlipidemia associated with type 2 diabetes mellitus (HCC)   Cocaine abuse (HCC)   Essential hypertension   Small cell lung cancer, right upper lobe ([place])   Slurred speech   Uncontrolled type 2 diabetes mellitus with hyperglycemia, with long-term current use of insulin (HCC)   Syncope   Orthostatic hypotension   Malignant neoplasm of lung ([place])   Discharge Condition: Stable and improved  Diet recommendation: Carb modified diet  Filed Weights   07/22/20 1754 07/24/20 0607 07/25/20 0416  Weight: 70.3 kg 67.4 kg 67.4 kg    History of present illness:  HPI per Dr. Cyd Silence 59 year old male with past medical history of previous right MCA stroke, small cell lung cancer with metastatic lesion to brain (dx 12/2019), ongoing cocaine abuse, systolic congestive heart failure (last echo 12/2019 EF 20-25%), status post ICD placement, insulin-dependent diabetes mellitus type 2, remote history of alcohol  abuse who presented to [facility] emergency department with complaints of unsteady gait slurred speech and left upper extremity tingling.  Patient explains that approximately 4 days ago he woke up and realized that he suddenly had difficulty with balance upon rising out of bed.  Patient states that he felt whenever he was ambulating that he would fall.  Patient denies any associated lightheadedness or loss of consciousness.  At approximately the same time patient also noticed that he was having tingling in his left upper extremity and also noticed that he was slurring his words.  Patient denies any associated visual changes, fevers, cough, shortness of breath, dysuria, nausea, vomiting, abdominal pain, sick contacts, recent travel or contacts with confirmed COVID-19 infection.  Patient states that he is compliant with all of his medications.  Patient does admit to ongoing cocaine abuse, last use on 9/24.  Due to patient's persisting neurologic deficits the patient eventually presented to [facility] emergency department on 9/25 for evaluation.  Upon evaluation in the emergency department, case was discussed with Dr. Malen Gauze with the emergency department staff and it was recommended the patient undergo CT imaging of the head with and without contrast.  The hospitalist group was then called to assess patient for mission to the hospital.  Hospital Course:  Slurred speech/LUE paresthesia/unsteady gait in patient with prior right MCA CVA/acute metabolic encephalopathy on admission-  CT head with and without contrast did not reveal CVA but possible pontine and medullary infiltrating lesion concerning for metastatic disease-underwent MRI of the brain after turning a pacemaker off 10/8 that showed abnormal enhancement affecting the ventral pons from the pontomedullary junction to the mid-brain-this is  presumed to represent metastatic disease.  No acute stroke. Echo EF of 25 to 30%  (improved). EEG no seizure activity, UDS positive for cocaine.HBA1c 10.5%. LDL 77.  Overall neurologically intact.  Patient placed on Decadron.  Outpatient follow-up with oncology.  Dr. Mickeal Skinner and Dr. Lorna Few.  Recurrent syncope/orthostatic hypotension: Syncope due to severe orthostasis.  Multiple episodes, and micturition syncope and orthostasis.  Orthostatic vitals still abnormal.  Increased salt tabs to 2 g 3 times daily, patient placed on midodrine 10 mg 3 times daily, lower extremities compression stocking and abdominal binder.  Wt 70.3> 67.4 kg and total Neg 6.5 liter.    Patient also given a 500 cc normal saline bolus.  Cardiology was consulted and follow the patient during the hospitalization.  Orthostasis likely from autonomic dysfunction versus from brain mets.   Dr. Maren Beach, discussed with Cardiology, Dr Johnsie Cancel and did not advise Florinef or Northera for orthostatic hypotension due to his poor EF/severe CHF.Encouraged ambulation with PT OT.  In the meantime patient was set up with hospital bed wheelchair, [facility], walker equipment for his house and he will be going to his mother's house where his brother will take care of him.  Patient understand that he is is being tried on maximal medical therapy for his orthostatic hypotension, if remains persistent he may need to change his lifestyle.  Cautious with activity, continue on fall precaution.  Patient chronically on anticoagulation.  Small cell lung cancer with brain mets status post whole brain radiation:CT head with and without contrast concerning for pontine and medullary infiltrates so patient subsequently started on Decadron 4 mg every 12 hoursper oncology recommendations. .Dr. Maren Beach, discussed with Dr Mickeal Skinner and recommended MRI brain with and without contrast, and cardiology has turned off pacer and had MRI 10/8- showed:"Newly seen abnormal enhancement affecting the ventral pons from the pontomedullary junction to the mid brain. This  is presumed represent metastatic disease. The possibility of treatment effect is considered but felt unlikely. Resolution of the previously seen right frontal metastasis. Old right MCA territory infarction"Spoke w/ Dr Mickeal Skinner and he has arranged outpatient follow-up on 10/15 to further discuss therapy.    Chronic systolic CHF status post AICD March/2021: Not on guideline directed heart failure therapy due to his hypotension and syncope.   Cardiology was consulted and follow the patient during the hospitalization.  Outpatient follow-up with primary cardiologist.   CAD history occluded LAD with collaterals/HLD/HTN:  Patient remained chest pain-free during the hospitalization.  Patient maintained on aspirin, statin and Coumadin.  Patient's Entresto and Coreg had to be discontinued secondary to persistent orthostasis.  Cardiology followed the patient throughout the hospitalization.  Outpatient follow-up with primary cardiologist.    History of Rt MCA CVA/cardiomyopathy  patient maintained on Coumadin.  If he is persistently symptomatically orthostatic his fall risk will increase his risk and benefits.  Outpatient follow-up with his primary cardiologist.    Polysubstance abuse/Cocaine abuse: Cessation advised.  Uncontrolled type 2 diabetes mellitus with hyperglycemia, with long-term current use of insulin. hba1c 10.5.  Patient was placed on Lantus 25 units twice daily as well as meal coverage NovoLog 6 units with meals as well as sliding scale insulin.  Blood glucose levels remain controlled.  Metformin was discontinued on discharge.  Last Labs          Recent Labs  Lab 07/23/20 1157 07/23/20 1638 07/23/20 2034 07/24/20 0755 07/24/20 1125  GLUCAP 199* 196* 172* 177* 249*     Lethargy/possible delirium-AAAOX3.  Improved clinically.  Ambulating  in the hallways.  Leukocytosis likely from steroid.  Remained stable.  Patient remained afebrile.  Outpatient follow-up. Last Labs          Recent  Labs  Lab 07/18/20 0435 07/20/20 0422 07/21/20 0550 07/22/20 0555 07/23/20 0513  WBC 15.0* 14.4* 16.8* 15.4* 15.6*     Debility/deconditioning/failure to thrive: Patient seen by PT OT.  Patient was discharged home with home health therapies.  Patient was also set up with bedside commode, hospital bed, walker, wheelchair.  Outpatient follow-up.   Anemia of chronic disease: Hemoglobin overall stable.  Monitor Last Labs          Recent Labs  Lab 07/18/20 0435 07/20/20 0422 07/21/20 0550 07/22/20 0555 07/23/20 0513  HGB 10.4* 10.7* 10.4* 10.1* 10.4*  HCT 33.0* 34.7* 33.2* 31.8* 33.4*       Procedures:  CT head 07/07/2020, 07/10/2020  Chest x-ray 07/07/2020  MRI brain 07/20/2020  2D echo 07/09/2020  Carotid Dopplers 07/09/2020  Consultations:  Cardiology: Dr. Golden Hurter 07/09/2020  Palliative care: Dr. Domingo Cocking 07/21/2020  Neuro oncology: Dr. Mickeal Skinner    Discharge Exam: Vitals:   07/25/20 0416 07/25/20 1141  BP: (!) 132/93 (!) 157/98  Pulse: 92 87  Resp:  18  Temp: 98.4 F (36.9 C) (!) 97.4 F (36.3 C)  SpO2: 98% 100%    General: NAD Cardiovascular: RRR Respiratory: CTAB  Discharge Instructions   Discharge Instructions    Diet Carb Modified   Complete by: As directed    Increase activity slowly   Complete by: As directed      Allergies as of 07/25/2020   No Known Allergies     Medication List    STOP taking these medications   carvedilol 6.25 MG tablet Commonly known as: COREG   Entresto 24-26 MG Generic drug: sacubitril-valsartan   metFORMIN 500 MG 24 hr tablet Commonly known as: GLUCOPHAGE-XR   ondansetron 8 MG tablet Commonly known as: ZOFRAN   spironolactone 25 MG tablet Commonly known as: ALDACTONE     TAKE these medications   Accu-Chek Softclix Lancets lancets   atorvastatin 20 MG tablet Commonly known as: LIPITOR TAKE 1 TABLET BY MOUTH DAILY AT 6 PM. What changed: See the new instructions.   dexamethasone 4 MG  tablet Commonly known as: DECADRON Take 1 tablet (4 mg total) by mouth every 12 (twelve) hours.   feeding supplement Liqd Take 237 mLs by mouth 2 (two) times daily between meals. Start taking on: July 26, 2020   Fifty50 Glucose Meter 2.0 w/Device Kit Use as instructed   insulin lispro 100 UNIT/ML KwikPen Commonly known as: HumaLOG KwikPen Inject 6 Units into the skin 3 (three) times daily.   Insupen Ultrafin 31G X 8 MM Misc Generic drug: Insulin Pen Needle   Lantus SoloStar 100 UNIT/ML Solostar Pen Generic drug: insulin glargine Inject 25 Units into the skin 2 (two) times daily. What changed:   how much to take  when to take this   midodrine 10 MG tablet Commonly known as: PROAMATINE Take 1 tablet (10 mg total) by mouth 3 (three) times daily with meals.   prochlorperazine 10 MG tablet Commonly known as: COMPAZINE Take 1 tablet (10 mg total) by mouth every 6 (six) hours as needed.   sodium chloride 1 g tablet Take 2 tablets (2 g total) by mouth 3 (three) times daily with meals.   warfarin 7.5 MG tablet Commonly known as: COUMADIN Take as directed. If you are unsure how to take this medication, talk  to your nurse or doctor. Original instructions: TAKE 1 TABLET BY MOUTH DAILY AT 6PM. What changed:   how much to take  how to take this  when to take this  additional instructions            Durable Medical Equipment  (From admission, onward)         Start     Ordered   07/24/20 1146  For home use only DME lightweight manual wheelchair with seat cushion  Once       Comments: Patient suffers from orthostatic hypotension which impairs their ability to perform daily activities like feeding, bathing,dressing in the home.  A walker will not resolve  issue with performing activities of daily living. A wheelchair will allow patient to safely perform daily activities. Patient is not able to propel themselves in the home using a standard weight wheelchair due to  general weakness . Patient can self propel in the lightweight wheelchair. Length of need 12 months . Accessories: elevating leg rests (ELRs), wheel locks, extensions and anti-tippers.   07/24/20 1146   07/23/20 1201  For home use only DME Walker  Once       Question:  Patient needs a walker to treat with the following condition  Answer:  Orthostasis   07/23/20 1200   07/23/20 1200  For home use only DME Hospital bed  Once       Question Answer Comment  Length of Need Lifetime   Bed type Semi-electric      07/23/20 1200   07/23/20 1200  For home use only DME Other see comment  Once       Comments: BSC  Question:  Length of Need  Answer:  Lifetime   07/23/20 1200         No Known Allergies  Follow-up Information    Kyung Rudd, MD. Call in 2 day(s).   Specialty: Radiation Oncology Contact information: [address] [place] [address] [phone]        Curt Bears, MD. Call in 2 day(s).   Specialty: Oncology Contact information: [place] [address] [phone]        Ventura Sellers, MD. Call on 07/27/2020.   Specialties: Psychiatry, Neurology, Oncology Why: f/u at [place] information: [place] [address] [phone]        [facility] Follow up.   Specialty: Home Health Services Why: agency will provide home health physical therapy, occupational therapy, and social work. Contact information: [place] [address] [phone]        Bensimhon, Shaune Pascal, MD. Schedule an appointment as soon as possible for a visit in 2 week(s).   Specialty: Cardiology Contact information: [place] [address] [phone]        coumadin clinic Follow up on 07/30/2020.                The results of significant diagnostics from this hospitalization (including imaging, microbiology, ancillary and laboratory) are listed below for  reference.    Significant Diagnostic Studies: CT HEAD WO CONTRAST  Result Date: 07/07/2020 CLINICAL DATA:  Slurred speech and dizziness.  History of CVA. EXAM: CT HEAD WITHOUT CONTRAST TECHNIQUE: Contiguous axial images were obtained from the base of the skull through the vertex without intravenous contrast. COMPARISON:  June 07, 2020 FINDINGS: Brain: No evidence of acute infarction, hemorrhage, hydrocephalus, extra-axial collection or mass lesion/mass effect. Stable  large area of hypoattenuation in the subcortical right frontal lobe and insula, with associated mild ex vacuo dilatation of the right lateral ventricle, sequela of prior right MCA infarct. Vascular: No hyperdense vessel or unexpected calcification. Skull: Normal. Negative for fracture or focal lesion. Sinuses/Orbits: No acute finding. Other: None. IMPRESSION: 1. No acute intracranial abnormality. 2. Stable large area of hypoattenuation in the subcortical right frontal lobe and insula, with associated mild ex vacuo dilatation of the right lateral ventricle, sequela of prior right MCA infarct. Electronically Signed   By: Fidela Salisbury M.D.   On: 07/07/2020 15:17   CT HEAD W CONTRAST  Result Date: 07/07/2020 CLINICAL DATA:  Neuro deficit EXAM: CT HEAD WITH CONTRAST TECHNIQUE: Contiguous axial images were obtained from the base of the skull through the vertex with intravenous contrast. CONTRAST:  128m OMNIPAQUE IOHEXOL 300 MG/ML  SOLN COMPARISON:  07/07/2020 head CT.  06/06/2020 head CT and prior. FINDINGS: Brain: Sequela of prior right frontal insult. No acute infarct. No intracranial hemorrhage. No abnormal enhancement or mass lesion. Mild cerebral atrophy with ex vacuo dilatation. No midline shift, ventriculomegaly or extra-axial fluid collection. Vascular: Normal intravascular enhancement. Skull: No acute finding. Sinuses/Orbits: No acute finding. Mild ethmoid sinus mucosal thickening. Other: None. IMPRESSION: No acute intracranial  process.  No abnormal enhancement. Sequela of prior right frontal insult, unchanged. Electronically Signed   By: CPrimitivo GauzeM.D.   On: 07/07/2020 19:43   CT HEAD W & WO CONTRAST  Result Date: 07/10/2020 CLINICAL DATA:  Small cell lung cancer. History of treated metastasis right frontal lobe. EXAM: CT HEAD WITHOUT AND WITH CONTRAST TECHNIQUE: Contiguous axial images were obtained from the base of the skull through the vertex without and with intravenous contrast CONTRAST:  724mOMNIPAQUE IOHEXOL 300 MG/ML  SOLN COMPARISON:  CT head without with contrast 07/07/2020. CT head with contrast 12/23/2019 FINDINGS: Brain: Right frontal metastasis seen on the prior study of 12/23/2019 has resolved. No residual enhancing lesion in the right frontal lobe. There is hypodensity and volume loss in the right insula and frontal lobe compatible with chronic infarct which is unchanged. There is patchy increased density in the ventral pons and proximal medulla postcontrast which appears to be actual enhancement rather than artifact. This is not seen on the precontrast study. Of 07/07/2020 this is similar to the recent CT but not present on 12/23/2019. This area is obscured by streak artifact however is concerning for metastatic disease. No other enhancing lesions identified. Negative for intracranial hemorrhage. No hydrocephalus or midline shift. No acute infarct. Vascular: Negative for hyperdense vessel Skull: No skeletal lesion identified. Sinuses/Orbits: Paranasal sinuses clear.  Negative orbit Other: None IMPRESSION: Treated lesion in the right frontal lobe no longer present without residual enhancement. There appears to be infiltrating enhancement in the ventral pons and proximal medulla concerning for metastatic disease. This is a relatively extensive area of enhancement. Review of the recent CT 3 days ago has a similar appearance. This would be better evaluated by MRI but apparently the patient is not able to have  an MRI. Patient has a pacemaker. Correlate with pacemaker manufacture and model for MRI compatibility. Chronic right frontal infarct.  No intracranial hemorrhage. These results were called by telephone at the time of interpretation on 07/10/2020 at 4:00 pm [facility] who verbally acknowledged these results. Electronically Signed   By: ChFranchot Gallo.D.   On: 07/10/2020 16:01   MR BRAIN W WO CONTRAST  Result Date: 07/20/2020 CLINICAL DATA:  Small  cell lung cancer. Brain metastases. Reassess. EXAM: MRI HEAD WITHOUT AND WITH CONTRAST TECHNIQUE: Multiplanar, multiecho pulse sequences of the brain and surrounding structures were obtained without and with intravenous contrast. CONTRAST:  6.9m GADAVIST GADOBUTROL 1 MMOL/ML IV SOLN COMPARISON:  Head CT 07/10/2020. Multiple other CT studies as distant as April 2016. FINDINGS: Brain: Diffusion imaging does not show any acute or subacute infarction. There is old infarction of the inferior division right MCA on the right affecting the insula and frontal operculum. This has progressed to atrophy and encephalomalacia. Elsewhere, cerebral hemispheres do not show any ischemic insults. Old small vessel infarction left cerebellum no hydrocephalus or extra-axial collection. Resolution of the previously seen right frontal metastasis. No evidence of residual disease in that location. Newly seen abnormal enhancement affecting the ventral surface the pons the level of the pontomedullary junction to the mid brain. This is an unusual appearance and is presumed to be subsequent to metastatic disease. Some potential this could relate to treatment effect higher doses were employed in this region, but I do not know why that would have been the case. Vascular: Major vessels at the base of the brain show flow. Skull and upper cervical spine: Negative Sinuses/Orbits: Clear/normal Other: None IMPRESSION: Newly seen abnormal enhancement affecting the ventral pons from the  pontomedullary junction to the mid brain. This is presumed represent metastatic disease. The possibility of treatment effect is considered but felt unlikely. Resolution of the previously seen right frontal metastasis. Old right MCA territory infarction. Electronically Signed   By: MNelson ChimesM.D.   On: 07/20/2020 13:11   DG Chest Port 1 View  Result Date: 07/07/2020 CLINICAL DATA:  Chills and weakness.  History of lung cancer. EXAM: PORTABLE CHEST 1 VIEW COMPARISON:  Chest radiograph 10/07/2019. FINDINGS: Stable multi lead pacer apparatus overlying the left hemithorax. Stable cardiac and mediastinal contours. No large area pulmonary consolidation. No pleural effusion or pneumothorax. Small right middle lobe spiculated nodule not well demonstrated on current exam. IMPRESSION: No acute cardiopulmonary process. Electronically Signed   By: DLovey NewcomerM.D.   On: 07/07/2020 13:54   EEG adult  Result Date: 07/09/2020 YLora Havens MD     07/09/2020  3:39 PM Patient Name: JXzavior Reinig MRN: [id number] Attending: PLora HavensReferring Physician/Provider: Dr. VHosie PoissonDate: 07/09/2020 Duration: 23.22 mins Patient history: 59year old male with previous right MCA stroke, metastatic small lung cancer with mets to brain status post whole brain radiation, ongoing cocaine abuse who had an episode of transient speech disturbance.  EEG to evaluate for seizures. Level of alertness: Awake, assleep AEDs during EEG study: None Technical aspects: This EEG study was done with scalp electrodes positioned according to the 10-20 International system of electrode placement. Electrical activity was acquired at a sampling rate of 500Hz and reviewed with a high frequency filter of 70Hz and a low frequency filter of 1Hz. EEG data were recorded continuously and digitally stored. Description: The posterior dominant rhythm consists of 9 Hz activity of moderate voltage (25-35 uV) seen predominantly in posterior head  regions, symmetric and reactive to eye opening and eye closing.  Sleep was characterized by vertex waves, sleep spindles (12 to 14 Hz), maximal frontocentral region.  Hyperventilation and photic stimulation were not performed.   IMPRESSION: This study is within normal limits. No seizures or epileptiform discharges were seen throughout the recording. PLora Havens  ECHOCARDIOGRAM COMPLETE  Result Date: 07/09/2020    ECHOCARDIOGRAM REPORT   Patient Name:  Lachlan Neila Gear. Date of Exam: 07/09/2020 Medical Rec #:  [id number]       Height:       72.0 in Accession #:    [id number]      Weight:       137.0 lb Date of Birth:  1961/01/30        BSA:          1.814 m Patient Age:    34 years        BP:           100/65 mmHg Patient Gender: M               HR:           94 bpm. Exam Location:  Inpatient Procedure: 2D Echo Indications:    TIA G45.9  History:        Patient has prior history of Echocardiogram examinations, most                 recent 12/25/2019. CHF and Ischemic Cardiomyopathy, CAD,                 Defibrillator; Risk Factors:Diabetes, Drug and Alcohol abuse and                 Dyslipidemia.  Sonographer:    Mikki Santee RDCS (AE) Referring Phys: [place]  1. Since the last exam on 12/25/2019 LVEF has slightly improved from 15-20% to 25-30% with diffuse hypokinesis.  2. Left ventricular ejection fraction, by estimation, is 25 to 30%. The left ventricle has severely decreased function. The left ventricle demonstrates global hypokinesis. The left ventricular internal cavity size was moderately dilated. There is mild concentric left ventricular hypertrophy. Left ventricular diastolic parameters are consistent with Grade II diastolic dysfunction (pseudonormalization). Elevated left atrial pressure.  3. Right ventricular systolic function is normal. The right ventricular size is normal. There is normal pulmonary artery systolic pressure.  4. A small pericardial effusion is present. The  pericardial effusion is posterior to the left ventricle.  5. The mitral valve is normal in structure. Mild mitral valve regurgitation. No evidence of mitral stenosis.  6. The aortic valve is normal in structure. Aortic valve regurgitation is trivial. No aortic stenosis is present.  7. The inferior vena cava is normal in size with greater than 50% respiratory variability, suggesting right atrial pressure of 3 mmHg. FINDINGS  Left Ventricle: Left ventricular ejection fraction, by estimation, is 25 to 30%. The left ventricle has severely decreased function. The left ventricle demonstrates global hypokinesis. The left ventricular internal cavity size was moderately dilated. There is mild concentric left ventricular hypertrophy. Left ventricular diastolic parameters are consistent with Grade II diastolic dysfunction (pseudonormalization). Elevated left atrial pressure. Right Ventricle: The right ventricular size is normal. No increase in right ventricular wall thickness. Right ventricular systolic function is normal. There is normal pulmonary artery systolic pressure. The tricuspid regurgitant velocity is 1.77 m/s, and  with an assumed right atrial pressure of 8 mmHg, the estimated right ventricular systolic pressure is 56.2 mmHg. Left Atrium: Left atrial size was normal in size. Right Atrium: Right atrial size was normal in size. Pericardium: A small pericardial effusion is present. The pericardial effusion is posterior to the left ventricle. Mitral Valve: The mitral valve is normal in structure. Mild mitral valve regurgitation. No evidence of mitral valve stenosis. Tricuspid Valve: The tricuspid valve is normal in structure. Tricuspid valve regurgitation is mild . No evidence of tricuspid stenosis. Aortic  Valve: The aortic valve is normal in structure. Aortic valve regurgitation is trivial. No aortic stenosis is present. Pulmonic Valve: The pulmonic valve was normal in structure. Pulmonic valve regurgitation is  trivial. No evidence of pulmonic stenosis. Aorta: The aortic root is normal in size and structure. Venous: The inferior vena cava is normal in size with greater than 50% respiratory variability, suggesting right atrial pressure of 3 mmHg. IAS/Shunts: No atrial level shunt detected by color flow Doppler. Additional Comments: A pacer wire is visualized in the right atrium and right ventricle.  LEFT VENTRICLE PLAX 2D LVIDd:         6.40 cm  Diastology LVIDs:         5.40 cm  LV e' medial: 9.68 cm/s LV PW:         1.20 cm LV IVS:        1.10 cm LVOT diam:     2.20 cm LV SV:         69 LV SV Index:   38 LVOT Area:     3.80 cm  RIGHT VENTRICLE RV S prime:     12.30 cm/s TAPSE (M-mode): 2.3 cm LEFT ATRIUM             Index       RIGHT ATRIUM           Index LA diam:        3.20 cm 1.76 cm/m  RA Area:     18.00 cm LA Vol (A2C):   57.9 ml 31.92 ml/m RA Volume:   46.30 ml  25.52 ml/m LA Vol (A4C):   53.6 ml 29.55 ml/m LA Biplane Vol: 61.1 ml 33.68 ml/m  AORTIC VALVE LVOT Vmax:   111.67 cm/s LVOT Vmean:  82.300 cm/s LVOT VTI:    0.180 m  AORTA Ao Root diam: 3.70 cm TRICUSPID VALVE TR Peak grad:   12.5 mmHg TR Vmax:        177.00 cm/s  SHUNTS Systemic VTI:  0.18 m Systemic Diam: 2.20 cm Ena Dawley MD Electronically signed by Ena Dawley MD Signature Date/Time: 07/09/2020/12:16:56 PM    Final    VAS US CAROTID (at [facility] and [facility] only)  Result Date: 07/09/2020 Carotid Arterial Duplex Study Indications:       CVA. Risk Factors:      Hypertension, hyperlipidemia. Comparison Study:  No prior studies. Performing Technologist: Oliver Hum RVT  Examination Guidelines: A complete evaluation includes B-mode imaging, spectral Doppler, color Doppler, and power Doppler as needed of all accessible portions of each vessel. Bilateral testing is considered an integral part of a complete examination. Limited examinations for reoccurring indications may be performed as noted.  Right Carotid Findings:  +----------+--------+--------+--------+-----------------------+--------+           PSV cm/sEDV cm/sStenosisPlaque Description     Comments +----------+--------+--------+--------+-----------------------+--------+ CCA Prox  110     30              smooth and heterogenous         +----------+--------+--------+--------+-----------------------+--------+ CCA Distal71      26              smooth and heterogenous         +----------+--------+--------+--------+-----------------------+--------+ ICA Prox  49      20              smooth and heterogenous         +----------+--------+--------+--------+-----------------------+--------+ ICA Distal63      27  tortuous +----------+--------+--------+--------+-----------------------+--------+ ECA       82      21                                              +----------+--------+--------+--------+-----------------------+--------+ +----------+--------+-------+--------+-------------------+           PSV cm/sEDV cmsDescribeArm Pressure (mmHG) +----------+--------+-------+--------+-------------------+ [id number]                                         +----------+--------+-------+--------+-------------------+ +---------+--------+--+--------+--+---------+ VertebralPSV cm/s57EDV cm/s28Antegrade +---------+--------+--+--------+--+---------+  Left Carotid Findings: +----------+--------+--------+--------+-----------------------+--------+           PSV cm/sEDV cm/sStenosisPlaque Description     Comments +----------+--------+--------+--------+-----------------------+--------+ CCA Prox  98      30              smooth and heterogenous         +----------+--------+--------+--------+-----------------------+--------+ CCA Distal56      18              smooth and heterogenous         +----------+--------+--------+--------+-----------------------+--------+ ICA Prox  48      19               smooth and heterogenous         +----------+--------+--------+--------+-----------------------+--------+ ICA Distal56      26                                     tortuous +----------+--------+--------+--------+-----------------------+--------+ ECA       40      6                                               +----------+--------+--------+--------+-----------------------+--------+ +----------+--------+--------+--------+-------------------+           PSV cm/sEDV cm/sDescribeArm Pressure (mmHG) +----------+--------+--------+--------+-------------------+ [id number]                                          +----------+--------+--------+--------+-------------------+ +---------+--------+--+--------+--+---------+ VertebralPSV cm/s43EDV cm/s20Antegrade +---------+--------+--+--------+--+---------+   Summary: Right Carotid: Velocities in the right ICA are consistent with a 1-39% stenosis. Left Carotid: Velocities in the left ICA are consistent with a 1-39% stenosis. Vertebrals: Bilateral vertebral arteries demonstrate antegrade flow. *See table(s) above for measurements and observations.  Electronically signed by Antony Contras MD on 07/09/2020 at 12:52:15 PM.    Final     Microbiology: No results found for this or any previous visit (from the past 240 hour(s)).   Labs: Basic Metabolic Panel: Recent Labs  Lab 07/20/20 0422 07/21/20 0550 07/22/20 0555 07/23/20 0513  NA 136 136 135 136  K 4.3 4.4 4.5 4.5  CL 100 99 100 99  CO2 _0 GLUCOSE 158* 158* 151* 164*  BUN 40* 42* 38* 39*  CREATININE 0.97 0.94 0.91 0.91  CALCIUM 9.6 9.8 9.4 9.5   Liver Function Tests: No results for input(s): AST, ALT, ALKPHOS, BILITOT, PROT, ALBUMIN in the last 168 hours. No results for input(s): LIPASE, AMYLASE in the last  168 hours. No results for input(s): AMMONIA in the last 168 hours. CBC: Recent Labs  Lab 07/20/20 0422 07/21/20 0550 07/22/20 0555 07/23/20 0513  WBC  14.4* 16.8* 15.4* 15.6*  HGB 10.7* 10.4* 10.1* 10.4*  HCT 34.7* 33.2* 31.8* 33.4*  MCV 96.7 96.5 96.7 96.8  PLT 318 325 309 285   Cardiac Enzymes: No results for input(s): CKTOTAL, CKMB, CKMBINDEX, TROPONINI in the last 168 hours. BNP: BNP (last 3 results) Recent Labs    08/05/19 0954  BNP 171.2*    ProBNP (last 3 results) No results for input(s): PROBNP in the last 8760 hours.  CBG: Recent Labs  Lab 07/24/20 1125 07/24/20 1633 07/24/20 2104 07/25/20 0752 07/25/20 1138  GLUCAP 249* 208* 197* 300* 77       Signed:  Irine Seal MD.  [facility] 07/25/2020, 4:08 PM

## 2020-07-27 ENCOUNTER — Other Ambulatory Visit: Payer: Self-pay

## 2020-07-27 ENCOUNTER — Inpatient Hospital Stay: Payer: Medicare Other | Attending: Radiation Oncology | Admitting: Internal Medicine

## 2020-07-27 VITALS — BP 120/86 | HR 97 | Temp 98.0°F | Resp 17 | Ht 72.0 in | Wt 153.7 lb

## 2020-07-27 DIAGNOSIS — G9389 Other specified disorders of brain: Secondary | ICD-10-CM | POA: Diagnosis not present

## 2020-07-27 DIAGNOSIS — Z87891 Personal history of nicotine dependence: Secondary | ICD-10-CM | POA: Insufficient documentation

## 2020-07-27 DIAGNOSIS — C3411 Malignant neoplasm of upper lobe, right bronchus or lung: Secondary | ICD-10-CM | POA: Diagnosis not present

## 2020-07-27 DIAGNOSIS — G939 Disorder of brain, unspecified: Secondary | ICD-10-CM | POA: Insufficient documentation

## 2020-07-27 NOTE — Progress Notes (Signed)
[facility] [place], [employer] [employer] [address] [phone]   New Patient Evaluation  Date of Service: 07/27/20 Patient Name: Dennis [name]. Patient MRN: [id number] Patient DOB: January 24, 1961 Provider: Ventura Sellers, MD  Identifying Statement:  Dennis [name]. is a 59 y.o. male with Brain mass [G93.89] who presents for initial consultation and evaluation regarding cancer associated neurologic deficits.    Referring Provider: [name], MD [address],  [place] [address]  Primary Cancer:  Oncologic History: Oncology History  Small cell lung cancer, right upper lobe ([facility])  01/19/2020 Initial Diagnosis   Small cell lung cancer, right upper lobe ([facility])   01/31/2020 -  Chemotherapy   The patient had palonosetron (ALOXI) injection 0.25 mg, 0.25 mg, Intravenous,  Once, 4 of 4 cycles Administration: 0.25 mg (01/31/2020), 0.25 mg (03/14/2020), 0.25 mg (04/03/2020), 0.25 mg (02/21/2020) pegfilgrastim-cbqv (UDENYCA) injection 6 mg, 6 mg, Subcutaneous, Once, 4 of 4 cycles Administration: 6 mg (02/04/2020), 6 mg (02/25/2020), 6 mg (03/20/2020), 6 mg (04/07/2020) CARBOplatin (PARAPLATIN) 370 mg in sodium chloride 0.9 % 250 mL chemo infusion, 370 mg (100 % of original dose 367 mg), Intravenous,  Once, 4 of 4 cycles Dose modification: 367 mg (original dose 367 mg, Cycle 1), 430.5 mg (original dose 430.5 mg, Cycle 3), 419.5 mg (original dose 419.5 mg, Cycle 4), 357 mg (original dose 357 mg, Cycle 2) Administration: 370 mg (01/31/2020), 390 mg (03/14/2020), 420 mg (04/03/2020), 360 mg (02/21/2020) etoposide (VEPESID) 190 mg in sodium chloride 0.9 % 500 mL chemo infusion, 100 mg/m2 = 190 mg, Intravenous,  Once, 4 of 4 cycles Administration: 190 mg (01/31/2020), 190 mg (02/01/2020), 190 mg (02/02/2020), 190 mg (03/14/2020), 190 mg (03/15/2020), 190 mg (03/16/2020), 190 mg (04/03/2020), 190 mg (04/04/2020), 190 mg (04/05/2020), 190 mg (02/21/2020), 190 mg  (02/22/2020), 190 mg (02/23/2020) fosaprepitant (EMEND) 150 mg in sodium chloride 0.9 % 145 mL IVPB, 150 mg, Intravenous,  Once, 4 of 4 cycles Administration: 150 mg (01/31/2020), 150 mg (03/14/2020), 150 mg (04/03/2020), 150 mg (02/21/2020) durvalumab (IMFINZI) 1,500 mg in sodium chloride 0.9 % 100 mL chemo infusion, 1,500 mg, Intravenous,  Once, 7 of 13 cycles Administration: 1,500 mg (01/31/2020), 1,500 mg (03/14/2020), 1,500 mg (04/03/2020), 1,500 mg (04/24/2020), 1,500 mg (05/23/2020), 1,500 mg (06/21/2020), 1,500 mg (02/21/2020)  for chemotherapy treatment.     CNS Oncologic Hx 01/17/20: Completes WBRT 07/07/20: CT head demonstrates pontine lesion of unclear etiology  History of Present Illness: The patient's records from the referring physician were obtained and reviewed and the patient interviewed to confirm this HPI.  Dennis [name]. presents to review recent neurologic symptoms and abnormal findings on CNS imaging.  He describes recent hospitalization, for which he presented on 07/08/20 with several days of progressive gait impairment, slurred speech and left sided numbness.  CT was performed which demonstrated anterior pontine lesion, consistent with possible metastasis.  Cardiology monitored MRI was then performed with AICD turned off, redemonstrating region of abnormality.  He feels relatively improved since corticosteroids were started during the admission.  He is working with physical therapy to regain some strength, currently ambulating 100% with a walker due to the imbalance.  Aside from balance issues he is functionally independent at this time.    Medications: Current Outpatient Medications on File Prior to Visit  Medication Sig Dispense Refill  . prochlorperazine (COMPAZINE) 10 MG tablet Take 1 tablet (10 mg total) by mouth every 6 (six) hours as needed.  10 tablet 1  . Accu-Chek Softclix Lancets lancets     . atorvastatin (LIPITOR) 20 MG tablet TAKE 1 TABLET BY MOUTH DAILY AT 6 PM. (Patient  taking differently: Take 20 mg by mouth daily at 6 PM. ) 30 tablet 6  . Blood Glucose Monitoring Suppl (FIFTY50 GLUCOSE METER 2.0) w/Device KIT Use as instructed    . dexamethasone (DECADRON) 4 MG tablet Take 1 tablet (4 mg total) by mouth every 12 (twelve) hours. 40 tablet 1  . feeding supplement (ENSURE ENLIVE / ENSURE PLUS) LIQD Take 237 mLs by mouth 2 (two) times daily between meals. 237 mL 0  . insulin glargine (LANTUS SOLOSTAR) 100 UNIT/ML Solostar Pen Inject 25 Units into the skin 2 (two) times daily. 15 mL 2  . insulin lispro (HUMALOG KWIKPEN) 100 UNIT/ML KwikPen Inject 6 Units into the skin 3 (three) times daily. 15 mL 1  . INSUPEN ULTRAFIN 31G X 8 MM MISC     . midodrine (PROAMATINE) 10 MG tablet Take 1 tablet (10 mg total) by mouth 3 (three) times daily with meals. 90 tablet 2  . sodium chloride 1 g tablet Take 2 tablets (2 g total) by mouth 3 (three) times daily with meals. 180 tablet 1  . warfarin (COUMADIN) 7.5 MG tablet TAKE 1 TABLET BY MOUTH DAILY AT 6PM. (Patient taking differently: Take 7.5 mg by mouth every evening. ) 30 tablet 0   No current facility-administered medications on file prior to visit.    Allergies: No Known Allergies Past Medical History:  Past Medical History:  Diagnosis Date  . Alcohol abuse with alcohol-induced mood disorder ([facility]) 08/18/2015  . Automatic implantable cardioverter-defibrillator in situ 2012   S/P St Jude Dual Chamber. ICD.  Marland Kitchen CAD (coronary artery disease) 11/16/2014   CAD Coronary angiography (12/2008) with mid LAD totally occluded and collateralized.  . Cardiac LV ejection fraction 10-20%   . Chest pain 09/04/2018  . CHF (congestive heart failure) ([facility])   . Chronic systolic heart failure (HCC)    a) Mixed ICM/NICM b) RHC (05/2014): RA 2, RV 19/2/3, PA 22/14 (18), PCWP 6, Fick CO/CI: 5.2 / 2.7, PVR 2.3 WU, PA 60% and 64% c) ECHO (05/2014): EF 20-25%, diff HK, akinesis entireanteroseptal myocardium, triv AI, mod MR, LA mod/sev dilated  .  Cocaine abuse ([place]) 01/24/2015  . Cocaine abuse with cocaine-induced mood disorder ([place]) 08/20/2015  . Drug abuse and dependence ([place])   . Dysphagia following cerebral infarction 01/24/2015  . Embolic stroke involving right middle cerebral artery ([place])   . Extensive stage primary small cell carcinoma of lung ([facility]) 01/04/2020  . H/O noncompliance with medical treatment, presenting hazards to health 01/24/2015  . Heart murmur   . High cholesterol   . Ischemic cardiomyopathy    a) Coronary angiography (12/2008) at [facility]: Lmain: nl, LAD mid 100% stenosis with left to left and right-to-left collaterals to the distal LAD; Lcx: nl, RCA nl.    . 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[facility])   . MDD (major depressive disorder), recurrent severe, without psychosis ([facility]) 08/18/2015  . Open wound of foot 02/27/2015  . Pneumonia 1988  . Psychoactive substance-induced mood disorder ([facility]) 07/17/2015  . Sleep apnea    "cleared after T&A"  . Status post PICC central line placement   . Stroke ([place])   . Substance induced mood disorder ([place]) 08/17/2015  . Type II diabetes mellitus ([place])    Past Surgical History:  Past Surgical History:  Procedure Laterality Date  . BRONCHIAL BRUSHINGS  12/28/2019   Procedure: BRONCHIAL BRUSHINGS;  Surgeon: Garner Nash, DO;  Location: [facility];  Service: Thoracic;;  . BRONCHIAL NEEDLE ASPIRATION BIOPSY  12/28/2019   Procedure: BRONCHIAL NEEDLE ASPIRATION BIOPSIES;  Surgeon: Garner Nash, DO;  Location: [facility];  Service: Thoracic;;  . BRONCHIAL WASHINGS  12/28/2019   Procedure: BRONCHIAL WASHINGS;  Surgeon: Garner Nash, DO;  Location: [facility];  Service: Thoracic;;  . CARDIAC CATHETERIZATION  05/2014  . CARDIAC DEFIBRILLATOR PLACEMENT  03/2011  . CHOLECYSTECTOMY  1994  . FOREARM FRACTURE SURGERY Left 1980  . FRACTURE SURGERY    . RIGHT HEART CATHETERIZATION N/A 05/30/2014   Procedure: RIGHT HEART CATH;  Surgeon: Jolaine Artist, MD;  Location: [facility] CATH LAB;   Service: Cardiovascular;  Laterality: N/A;  . RIGHT HEART CATHETERIZATION N/A 02/07/2015   Procedure: RIGHT HEART CATH;  Surgeon: Jolaine Artist, MD;  Location: [facility] CATH LAB;  Service: Cardiovascular;  Laterality: N/A;  . TONSILLECTOMY AND ADENOIDECTOMY  ~ 1998  . VIDEO BRONCHOSCOPY WITH ENDOBRONCHIAL ULTRASOUND N/A 12/28/2019   Procedure: VIDEO BRONCHOSCOPY WITH ENDOBRONCHIAL ULTRASOUND;  Surgeon: Garner Nash, DO;  Location: [facility];  Service: Thoracic;  Laterality: N/A;   Social History:  Social History   Socioeconomic History  . Marital status: Single    Spouse name: Not on file  . Number of children: 1  . Years of education: 13  . Highest education level: Not on file  Occupational History  . Occupation: Disabled  Tobacco Use  . Smoking status: Former Smoker    Packs/day: 0.25    Years: 31.00    Pack years: 7.75    Types: Cigarettes  . Smokeless tobacco: Never Used  Vaping Use  . Vaping Use: Never used  Substance and Sexual Activity  . Alcohol use: Not Currently    Alcohol/week: 0.0 standard drinks    Comment: 09/21/2014 "might have a drink q 6 /months, if that"  . Drug use: Yes    Types: Cocaine    Comment: last use 07/06/2020  . Sexual activity: Yes  Other Topics Concern  . Not on file  Social History Narrative   Lives in [place] with his mother. No pets. Fun: movies, sports, daughter.    Denies religious beliefs that would effect health care.    Social Determinants of Health   Financial Resource Strain:   . Difficulty of Paying Living Expenses: Not on file  Food Insecurity:   . Worried About Charity fundraiser in the Last Year: Not on file  . Ran Out of Food in the Last Year: Not on file  Transportation Needs:   . Lack of Transportation (Medical): Not on file  . Lack of Transportation (Non-Medical): Not on file  Physical Activity:   . Days of Exercise per Week: Not on file  . Minutes of Exercise per Session: Not on file  Stress:   . Feeling  of Stress : Not on file  Social Connections:   . Frequency of Communication with Friends and Family: Not on file  . Frequency of Social Gatherings with Friends and Family: Not on file  . Attends Religious Services: Not on file  . Active Member of Clubs or Organizations: Not on file  . Attends Archivist Meetings: Not on file  . Marital Status: Not on file  Intimate Partner Violence:   . Fear of Current or Ex-Partner: Not on file  . Emotionally Abused: Not on file  . Physically Abused: Not on file  .  Sexually Abused: Not on file   Family History:  Family History  Problem Relation Age of Onset  . Diabetes Mother   . Heart disease Mother   . Diabetes Father   . Prostate cancer Father   . Heart disease Father   . Diabetes Brother   . Diabetes Brother     Review of Systems: Constitutional: Doesn't report fevers, chills or abnormal weight loss Eyes: Doesn't report blurriness of vision Ears, nose, mouth, throat, and face: Doesn't report sore throat Respiratory: Doesn't report cough, dyspnea or wheezes Cardiovascular: Doesn't report palpitation, chest discomfort  Gastrointestinal:  Doesn't report nausea, constipation, diarrhea GU: Doesn't report incontinence Skin: Doesn't report skin rashes Neurological: Per HPI Musculoskeletal: Doesn't report joint pain Behavioral/Psych: Doesn't report anxiety  Physical Exam: Vitals:   07/27/20 0956  BP: 120/86  Pulse: 97  Resp: 17  Temp: 98 F (36.7 C)  SpO2: 98%   KPS: 70. General: Alert, cooperative, pleasant, in no acute distress Head: Normal EENT: No conjunctival injection or scleral icterus.  Lungs: Resp effort normal Cardiac: Regular rate Abdomen: Non-distended abdomen Skin: No rashes cyanosis or petechiae. Extremities: No clubbing or edema  Neurologic Exam: Mental Status: Awake, alert, attentive to examiner. Oriented to self and environment. Language is fluent with intact comprehension.  Cranial Nerves: Visual  acuity is grossly normal. Visual fields are full. Extra-ocular movements intact. No ptosis. Face is symmetric Motor: Tone and bulk are normal. Power is full in both arms and legs. Reflexes are symmetric, no pathologic reflexes present.  Sensory: Intact to light touch Gait: Dystaxic, not independent  Labs: I have reviewed the data as listed    Component Value Date/Time   NA 136 07/23/2020 0513   K 4.5 07/23/2020 0513   CL 99 07/23/2020 0513   CO2 29 07/23/2020 0513   GLUCOSE 164 (H) 07/23/2020 0513   BUN 39 (H) 07/23/2020 0513   CREATININE 0.91 07/23/2020 0513   CREATININE 1.53 (H) 06/20/2020 1051   CALCIUM 9.5 07/23/2020 0513   PROT 6.8 07/08/2020 0542   ALBUMIN 2.9 (L) 07/16/2020 0543   AST 18 07/08/2020 0542   AST 10 (L) 06/20/2020 1051   ALT 16 07/08/2020 0542   ALT 8 06/20/2020 1051   ALKPHOS 83 07/08/2020 0542   BILITOT 0.7 07/08/2020 0542   BILITOT 0.3 06/20/2020 1051   GFRNONAA >60 07/23/2020 0513   GFRNONAA 49 (L) 06/20/2020 1051   GFRAA >60 07/16/2020 0543   GFRAA 57 (L) 06/20/2020 1051   Lab Results  Component Value Date   WBC 15.6 (H) 07/23/2020   NEUTROABS 5.5 07/11/2020   HGB 10.4 (L) 07/23/2020   HCT 33.4 (L) 07/23/2020   MCV 96.8 07/23/2020   PLT 285 07/23/2020    Imaging:  CT HEAD WO CONTRAST  Result Date: 07/07/2020 CLINICAL DATA:  Slurred speech and dizziness.  History of CVA. EXAM: CT HEAD WITHOUT CONTRAST TECHNIQUE: Contiguous axial images were obtained from the base of the skull through the vertex without intravenous contrast. COMPARISON:  June 07, 2020 FINDINGS: Brain: No evidence of acute infarction, hemorrhage, hydrocephalus, extra-axial collection or mass lesion/mass effect. Stable large area of hypoattenuation in the subcortical right frontal lobe and insula, with associated mild ex vacuo dilatation of the right lateral ventricle, sequela of prior right MCA infarct. Vascular: No hyperdense vessel or unexpected calcification. Skull: Normal.  Negative for fracture or focal lesion. Sinuses/Orbits: No acute finding. Other: None. IMPRESSION: 1. No acute intracranial abnormality. 2. Stable large area of hypoattenuation in the subcortical  right frontal lobe and insula, with associated mild ex vacuo dilatation of the right lateral ventricle, sequela of prior right MCA infarct. Electronically Signed   By: Fidela Salisbury M.D.   On: 07/07/2020 15:17   CT HEAD W CONTRAST  Result Date: 07/07/2020 CLINICAL DATA:  Neuro deficit EXAM: CT HEAD WITH CONTRAST TECHNIQUE: Contiguous axial images were obtained from the base of the skull through the vertex with intravenous contrast. CONTRAST:  110m OMNIPAQUE IOHEXOL 300 MG/ML  SOLN COMPARISON:  07/07/2020 head CT.  06/06/2020 head CT and prior. FINDINGS: Brain: Sequela of prior right frontal insult. No acute infarct. No intracranial hemorrhage. No abnormal enhancement or mass lesion. Mild cerebral atrophy with ex vacuo dilatation. No midline shift, ventriculomegaly or extra-axial fluid collection. Vascular: Normal intravascular enhancement. Skull: No acute finding. Sinuses/Orbits: No acute finding. Mild ethmoid sinus mucosal thickening. Other: None. IMPRESSION: No acute intracranial process.  No abnormal enhancement. Sequela of prior right frontal insult, unchanged. Electronically Signed   By: CPrimitivo GauzeM.D.   On: 07/07/2020 19:43   CT HEAD W & WO CONTRAST  Result Date: 07/10/2020 CLINICAL DATA:  Small cell lung cancer. History of treated metastasis right frontal lobe. EXAM: CT HEAD WITHOUT AND WITH CONTRAST TECHNIQUE: Contiguous axial images were obtained from the base of the skull through the vertex without and with intravenous contrast CONTRAST:  759mOMNIPAQUE IOHEXOL 300 MG/ML  SOLN COMPARISON:  CT head without with contrast 07/07/2020. CT head with contrast 12/23/2019 FINDINGS: Brain: Right frontal metastasis seen on the prior study of 12/23/2019 has resolved. No residual enhancing lesion in the  right frontal lobe. There is hypodensity and volume loss in the right insula and frontal lobe compatible with chronic infarct which is unchanged. There is patchy increased density in the ventral pons and proximal medulla postcontrast which appears to be actual enhancement rather than artifact. This is not seen on the precontrast study. Of 07/07/2020 this is similar to the recent CT but not present on 12/23/2019. This area is obscured by streak artifact however is concerning for metastatic disease. No other enhancing lesions identified. Negative for intracranial hemorrhage. No hydrocephalus or midline shift. No acute infarct. Vascular: Negative for hyperdense vessel Skull: No skeletal lesion identified. Sinuses/Orbits: Paranasal sinuses clear.  Negative orbit Other: None IMPRESSION: Treated lesion in the right frontal lobe no longer present without residual enhancement. There appears to be infiltrating enhancement in the ventral pons and proximal medulla concerning for metastatic disease. This is a relatively extensive area of enhancement. Review of the recent CT 3 days ago has a similar appearance. This would be better evaluated by MRI but apparently the patient is not able to have an MRI. Patient has a pacemaker. Correlate with pacemaker manufacture and model for MRI compatibility. Chronic right frontal infarct.  No intracranial hemorrhage. These results were called by telephone at the time of interpretation on 07/10/2020 at 4:00 pm [facility] who verbally acknowledged these results. Electronically Signed   By: ChFranchot Gallo.D.   On: 07/10/2020 16:01   MR BRAIN W WO CONTRAST  Result Date: 07/20/2020 CLINICAL DATA:  Small cell lung cancer. Brain metastases. Reassess. EXAM: MRI HEAD WITHOUT AND WITH CONTRAST TECHNIQUE: Multiplanar, multiecho pulse sequences of the brain and surrounding structures were obtained without and with intravenous contrast. CONTRAST:  6.10m33mADAVIST GADOBUTROL 1 MMOL/ML IV  SOLN COMPARISON:  Head CT 07/10/2020. Multiple other CT studies as distant as April 2016. FINDINGS: Brain: Diffusion imaging does not show any acute or subacute  infarction. There is old infarction of the inferior division right MCA on the right affecting the insula and frontal operculum. This has progressed to atrophy and encephalomalacia. Elsewhere, cerebral hemispheres do not show any ischemic insults. Old small vessel infarction left cerebellum no hydrocephalus or extra-axial collection. Resolution of the previously seen right frontal metastasis. No evidence of residual disease in that location. Newly seen abnormal enhancement affecting the ventral surface the pons the level of the pontomedullary junction to the mid brain. This is an unusual appearance and is presumed to be subsequent to metastatic disease. Some potential this could relate to treatment effect higher doses were employed in this region, but I do not know why that would have been the case. Vascular: Major vessels at the base of the brain show flow. Skull and upper cervical spine: Negative Sinuses/Orbits: Clear/normal Other: None IMPRESSION: Newly seen abnormal enhancement affecting the ventral pons from the pontomedullary junction to the mid brain. This is presumed represent metastatic disease. The possibility of treatment effect is considered but felt unlikely. Resolution of the previously seen right frontal metastasis. Old right MCA territory infarction. Electronically Signed   By: Nelson Chimes M.D.   On: 07/20/2020 13:11   DG Chest Port 1 View  Result Date: 07/07/2020 CLINICAL DATA:  Chills and weakness.  History of lung cancer. EXAM: PORTABLE CHEST 1 VIEW COMPARISON:  Chest radiograph 10/07/2019. FINDINGS: Stable multi lead pacer apparatus overlying the left hemithorax. Stable cardiac and mediastinal contours. No large area pulmonary consolidation. No pleural effusion or pneumothorax. Small right middle lobe spiculated nodule not well  demonstrated on current exam. IMPRESSION: No acute cardiopulmonary process. Electronically Signed   By: Lovey Newcomer M.D.   On: 07/07/2020 13:54   EEG adult  Result Date: 07/09/2020 Lora Havens, MD     07/09/2020  3:39 PM Patient Name: Dennis [name]. MRN: [id number] Epilepsy Attending: Lora Havens Referring Physician/Provider: Dr. Hosie Poisson Date: 07/09/2020 Duration: 23.22 mins Patient history: 59 year old male with previous right MCA stroke, metastatic small lung cancer with mets to brain status post whole brain radiation, ongoing cocaine abuse who had an episode of transient speech disturbance.  EEG to evaluate for seizures. Level of alertness: Awake, assleep AEDs during EEG study: None Technical aspects: This EEG study was done with scalp electrodes positioned according to the 10-20 International system of electrode placement. Electrical activity was acquired at a sampling rate of '500Hz'  and reviewed with a high frequency filter of '70Hz'  and a low frequency filter of '1Hz' . EEG data were recorded continuously and digitally stored. Description: The posterior dominant rhythm consists of 9 Hz activity of moderate voltage (25-35 uV) seen predominantly in posterior head regions, symmetric and reactive to eye opening and eye closing.  Sleep was characterized by vertex waves, sleep spindles (12 to 14 Hz), maximal frontocentral region.  Hyperventilation and photic stimulation were not performed.   IMPRESSION: This study is within normal limits. No seizures or epileptiform discharges were seen throughout the recording. Lora Havens   ECHOCARDIOGRAM COMPLETE  Result Date: 07/09/2020    ECHOCARDIOGRAM REPORT   Patient Name:   Dennis [name]. Date of Exam: 07/09/2020 Medical Rec #:  [id number]       Height:       72.0 in Accession #:    [id number]      Weight:       137.0 lb Date of Birth:  15-Sep-1961        BSA:  1.814 m Patient Age:    56 years        BP:           100/65 mmHg Patient Gender: M                HR:           94 bpm. Exam Location:  Inpatient Procedure: 2D Echo Indications:    TIA G45.9  History:        Patient has prior history of Echocardiogram examinations, most                 recent 12/25/2019. CHF and Ischemic Cardiomyopathy, CAD,                 Defibrillator; Risk Factors:Diabetes, Drug and Alcohol abuse and                 Dyslipidemia.  Sonographer:    Mikki Santee RDCS (AE) Referring Phys: [place]  1. Since the last exam on 12/25/2019 LVEF has slightly improved from 15-20% to 25-30% with diffuse hypokinesis.  2. Left ventricular ejection fraction, by estimation, is 25 to 30%. The left ventricle has severely decreased function. The left ventricle demonstrates global hypokinesis. The left ventricular internal cavity size was moderately dilated. There is mild concentric left ventricular hypertrophy. Left ventricular diastolic parameters are consistent with Grade II diastolic dysfunction (pseudonormalization). Elevated left atrial pressure.  3. Right ventricular systolic function is normal. The right ventricular size is normal. There is normal pulmonary artery systolic pressure.  4. A small pericardial effusion is present. The pericardial effusion is posterior to the left ventricle.  5. The mitral valve is normal in structure. Mild mitral valve regurgitation. No evidence of mitral stenosis.  6. The aortic valve is normal in structure. Aortic valve regurgitation is trivial. No aortic stenosis is present.  7. The inferior vena cava is normal in size with greater than 50% respiratory variability, suggesting right atrial pressure of 3 mmHg. FINDINGS  Left Ventricle: Left ventricular ejection fraction, by estimation, is 25 to 30%. The left ventricle has severely decreased function. The left ventricle demonstrates global hypokinesis. The left ventricular internal cavity size was moderately dilated. There is mild concentric left ventricular hypertrophy. Left ventricular  diastolic parameters are consistent with Grade II diastolic dysfunction (pseudonormalization). Elevated left atrial pressure. Right Ventricle: The right ventricular size is normal. No increase in right ventricular wall thickness. Right ventricular systolic function is normal. There is normal pulmonary artery systolic pressure. The tricuspid regurgitant velocity is 1.77 m/s, and  with an assumed right atrial pressure of 8 mmHg, the estimated right ventricular systolic pressure is 62.0 mmHg. Left Atrium: Left atrial size was normal in size. Right Atrium: Right atrial size was normal in size. Pericardium: A small pericardial effusion is present. The pericardial effusion is posterior to the left ventricle. Mitral Valve: The mitral valve is normal in structure. Mild mitral valve regurgitation. No evidence of mitral valve stenosis. Tricuspid Valve: The tricuspid valve is normal in structure. Tricuspid valve regurgitation is mild . No evidence of tricuspid stenosis. Aortic Valve: The aortic valve is normal in structure. Aortic valve regurgitation is trivial. No aortic stenosis is present. Pulmonic Valve: The pulmonic valve was normal in structure. Pulmonic valve regurgitation is trivial. No evidence of pulmonic stenosis. Aorta: The aortic root is normal in size and structure. Venous: The inferior vena cava is normal in size with greater than 50% respiratory variability, suggesting right atrial pressure of 3  mmHg. IAS/Shunts: No atrial level shunt detected by color flow Doppler. Additional Comments: A pacer wire is visualized in the right atrium and right ventricle.  LEFT VENTRICLE PLAX 2D LVIDd:         6.40 cm  Diastology LVIDs:         5.40 cm  LV e' medial: 9.68 cm/s LV PW:         1.20 cm LV IVS:        1.10 cm LVOT diam:     2.20 cm LV SV:         69 LV SV Index:   38 LVOT Area:     3.80 cm  RIGHT VENTRICLE RV S prime:     12.30 cm/s TAPSE (M-mode): 2.3 cm LEFT ATRIUM             Index       RIGHT ATRIUM            Index LA diam:        3.20 cm 1.76 cm/m  RA Area:     18.00 cm LA Vol (A2C):   57.9 ml 31.92 ml/m RA Volume:   46.30 ml  25.52 ml/m LA Vol (A4C):   53.6 ml 29.55 ml/m LA Biplane Vol: 61.1 ml 33.68 ml/m  AORTIC VALVE LVOT Vmax:   111.67 cm/s LVOT Vmean:  82.300 cm/s LVOT VTI:    0.180 m  AORTA Ao Root diam: 3.70 cm TRICUSPID VALVE TR Peak grad:   12.5 mmHg TR Vmax:        177.00 cm/s  SHUNTS Systemic VTI:  0.18 m Systemic Diam: 2.20 cm Ena Dawley MD Electronically signed by Ena Dawley MD Signature Date/Time: 07/09/2020/12:16:56 PM    Final    VAS US CAROTID (at [facility] and [facility] only)  Result Date: 07/09/2020 Carotid Arterial Duplex Study Indications:       CVA. Risk Factors:      Hypertension, hyperlipidemia. Comparison Study:  No prior studies. Performing Technologist: Oliver Hum RVT  Examination Guidelines: A complete evaluation includes B-mode imaging, spectral Doppler, color Doppler, and power Doppler as needed of all accessible portions of each vessel. Bilateral testing is considered an integral part of a complete examination. Limited examinations for reoccurring indications may be performed as noted.  Right Carotid Findings: +----------+--------+--------+--------+-----------------------+--------+           PSV cm/sEDV cm/sStenosisPlaque Description     Comments +----------+--------+--------+--------+-----------------------+--------+ CCA Prox  110     30              smooth and heterogenous         +----------+--------+--------+--------+-----------------------+--------+ CCA Distal71      26              smooth and heterogenous         +----------+--------+--------+--------+-----------------------+--------+ ICA Prox  49      20              smooth and heterogenous         +----------+--------+--------+--------+-----------------------+--------+ ICA Distal63      27                                     tortuous  +----------+--------+--------+--------+-----------------------+--------+ ECA       82      21                                              +----------+--------+--------+--------+-----------------------+--------+ +----------+--------+-------+--------+-------------------+  PSV cm/sEDV cmsDescribeArm Pressure (mmHG) +----------+--------+-------+--------+-------------------+ [id number]                                         +----------+--------+-------+--------+-------------------+ +---------+--------+--+--------+--+---------+ VertebralPSV cm/s57EDV cm/s28Antegrade +---------+--------+--+--------+--+---------+  Left Carotid Findings: +----------+--------+--------+--------+-----------------------+--------+           PSV cm/sEDV cm/sStenosisPlaque Description     Comments +----------+--------+--------+--------+-----------------------+--------+ CCA Prox  98      30              smooth and heterogenous         +----------+--------+--------+--------+-----------------------+--------+ CCA Distal56      18              smooth and heterogenous         +----------+--------+--------+--------+-----------------------+--------+ ICA Prox  48      19              smooth and heterogenous         +----------+--------+--------+--------+-----------------------+--------+ ICA Distal56      26                                     tortuous +----------+--------+--------+--------+-----------------------+--------+ ECA       40      6                                               +----------+--------+--------+--------+-----------------------+--------+ +----------+--------+--------+--------+-------------------+           PSV cm/sEDV cm/sDescribeArm Pressure (mmHG) +----------+--------+--------+--------+-------------------+ [id number]                                          +----------+--------+--------+--------+-------------------+  +---------+--------+--+--------+--+---------+ VertebralPSV cm/s43EDV cm/s20Antegrade +---------+--------+--+--------+--+---------+   Summary: Right Carotid: Velocities in the right ICA are consistent with a 1-39% stenosis. Left Carotid: Velocities in the left ICA are consistent with a 1-39% stenosis. Vertebrals: Bilateral vertebral arteries demonstrate antegrade flow. *See table(s) above for measurements and observations.  Electronically signed by Antony Contras MD on 07/09/2020 at 12:52:15 PM.    Final     [facility] Clinician Interpretation: I have personally reviewed the radiological images as listed.  My interpretation, in the context of the patient's clinical presentation, is treatment effect vs true progression    Assessment/Plan Brain mass [G93.89]  Dennis [name]. presents with clinical and radiographic syndrome localizing the to the brainstem.  Etiology is unclear; could represent early and refractory metastasis from small cell carcinoma.  Alternately, could represent region of blood brain barrier disruption in response to prior radiation, or some other inflammatory non-neoplastic process.  In addition to very atypical imaging presentation for metastasis, positive response to corticosteroids is supportive of inflammatory process.  Treating aggressively with radiosurgery in this location would cause significant harm if the etiology is non-neoplastic.   We will recommend continuing steroids and repeating brain MRI under supervision with AICD temporarily deactivated.  Decadron can be decreased to 4m daily to avoid potential side effects, in particular fluid retention given his advanced congestive heart failure.     We spent twenty additional minutes teaching regarding the natural history, biology, and historical experience in the treatment  of neurologic complications of cancer.   We appreciate the opportunity to participate in the care of [place].Marland Kitchen  He will also continue to follow up  with Dr. Julien Nordmann for lung cancer management.  All questions were answered. The patient knows to call the clinic with any problems, questions or concerns. No barriers to learning were detected.  The total time spent in the encounter was 40 minutes and more than 50% was on counseling and review of test results   Ventura Sellers, MD Medical Director of Neuro-Oncology [place] [facility] [place] 07/27/20 10:08 AM

## 2020-07-30 ENCOUNTER — Telehealth: Payer: Self-pay | Admitting: Internal Medicine

## 2020-07-30 NOTE — Telephone Encounter (Signed)
Scheduled per 10/15 los. Unable tor reach pt. Left voicemail with appt time and date.

## 2020-08-01 ENCOUNTER — Other Ambulatory Visit: Payer: Self-pay | Admitting: Radiation Therapy

## 2020-08-01 ENCOUNTER — Telehealth: Payer: Self-pay | Admitting: *Deleted

## 2020-08-01 ENCOUNTER — Ambulatory Visit (INDEPENDENT_AMBULATORY_CARE_PROVIDER_SITE_OTHER): Payer: Medicare Other

## 2020-08-01 DIAGNOSIS — I255 Ischemic cardiomyopathy: Secondary | ICD-10-CM

## 2020-08-01 NOTE — Telephone Encounter (Signed)
Called pt since he is overdue and was recently discharged from the hospital. Pt answered and he was completing physical therapy but set an appt for tomorrow at 315pm.

## 2020-08-02 ENCOUNTER — Other Ambulatory Visit: Payer: Self-pay

## 2020-08-02 ENCOUNTER — Ambulatory Visit (INDEPENDENT_AMBULATORY_CARE_PROVIDER_SITE_OTHER): Payer: Medicare Other

## 2020-08-02 DIAGNOSIS — Z7901 Long term (current) use of anticoagulants: Secondary | ICD-10-CM

## 2020-08-02 DIAGNOSIS — I428 Other cardiomyopathies: Secondary | ICD-10-CM

## 2020-08-02 LAB — POCT INR: INR: 4.7 — AB (ref 2.0–3.0)

## 2020-08-02 NOTE — Patient Instructions (Addendum)
Description   Skip Friday and Saturday's dosage of Warfarin, then start taking 1/2 tablet daily.  Recheck in 1 week.  Recheck in 1 week.  Main # [phone] [facility] # [phone].

## 2020-08-07 ENCOUNTER — Telehealth: Payer: Self-pay

## 2020-08-07 LAB — CUP PACEART REMOTE DEVICE CHECK
Battery Remaining Longevity: 3 mo
Battery Remaining Percentage: 3 %
Battery Voltage: 2.6 V
Brady Statistic AP VP Percent: 1 %
Brady Statistic AP VS Percent: 1 %
Brady Statistic AS VP Percent: 1 %
Brady Statistic AS VS Percent: 98 %
Brady Statistic RA Percent Paced: 1 %
Brady Statistic RV Percent Paced: 1 %
Date Time Interrogation Session: 20211024121740
HighPow Impedance: 55 Ohm
HighPow Impedance: 55 Ohm
Implantable Lead Implant Date: 20110513
Implantable Lead Implant Date: 20110513
Implantable Lead Location: 753859
Implantable Lead Location: 753860
Implantable Pulse Generator Implant Date: 20110513
Lead Channel Impedance Value: 280 Ohm
Lead Channel Impedance Value: 300 Ohm
Lead Channel Pacing Threshold Amplitude: 0.5 V
Lead Channel Pacing Threshold Amplitude: 0.5 V
Lead Channel Pacing Threshold Pulse Width: 0.5 ms
Lead Channel Pacing Threshold Pulse Width: 0.5 ms
Lead Channel Sensing Intrinsic Amplitude: 12 mV
Lead Channel Sensing Intrinsic Amplitude: 5 mV
Lead Channel Setting Pacing Amplitude: 2 V
Lead Channel Setting Pacing Amplitude: 2.5 V
Lead Channel Setting Pacing Pulse Width: 0.5 ms
Lead Channel Setting Sensing Sensitivity: 0.5 mV
Pulse Gen Serial Number: 608100

## 2020-08-07 NOTE — Progress Notes (Signed)
Remote ICD transmission.   

## 2020-08-07 NOTE — Addendum Note (Signed)
Addended by: Cheri Kearns A on: 08/07/2020 02:28 PM   Modules accepted: Level of Service

## 2020-08-07 NOTE — Telephone Encounter (Signed)
Pt is scheduled for monthly battery checks, he has missed the last 2 checks.  Spoke with pt, advised of battery status and importance of monthly checks.  He v/u that next scheduled check is 09/03/20.

## 2020-08-14 ENCOUNTER — Ambulatory Visit (INDEPENDENT_AMBULATORY_CARE_PROVIDER_SITE_OTHER): Payer: Medicare Other | Admitting: *Deleted

## 2020-08-14 ENCOUNTER — Other Ambulatory Visit: Payer: Self-pay

## 2020-08-14 DIAGNOSIS — Z7901 Long term (current) use of anticoagulants: Secondary | ICD-10-CM | POA: Diagnosis not present

## 2020-08-14 DIAGNOSIS — I428 Other cardiomyopathies: Secondary | ICD-10-CM

## 2020-08-14 LAB — POCT INR: INR: 1.8 — AB (ref 2.0–3.0)

## 2020-08-14 NOTE — Patient Instructions (Signed)
Description   Today take take 1 tablet of Warfarin, then start taking 1/2 tablet daily except 1 tablet on Tuesdays.  Recheck in 1 week.  Recheck in 1 week.  Main # [phone] [facility] # [phone].

## 2020-08-24 ENCOUNTER — Ambulatory Visit (HOSPITAL_COMMUNITY)
Admission: RE | Admit: 2020-08-24 | Discharge: 2020-08-24 | Disposition: A | Discharge status: Home / Self Care | Payer: Medicare Other | Source: Ambulatory Visit | Attending: Internal Medicine | Admitting: Internal Medicine

## 2020-08-24 ENCOUNTER — Other Ambulatory Visit: Payer: Self-pay

## 2020-08-24 DIAGNOSIS — G9389 Other specified disorders of brain: Secondary | ICD-10-CM | POA: Diagnosis present

## 2020-08-24 IMAGING — MR MR HEAD WO/W CM
13 of 16 series · 31 of 48 positions shown · IV contrast (Contrast agent)
Comparison: MRI of the brain [DATE].

CLINICAL DATA: Brain/CNS neoplasm.  Assess treatment response.

EXAM:
MRI HEAD WITHOUT AND WITH CONTRAST
TECHNIQUE: Multiplanar, multiecho pulse sequences of the brain and surrounding
structures were obtained without and with intravenous contrast.
CONTRAST:  6.9mL GADAVIST GADOBUTROL 1 MMOL/ML IV SOLN

[Series 5: FLAIR · sagittal · 3.0mm · 0.78mm/px · 1 of 37 slices shown (1 of 4)]
[im 1/37]
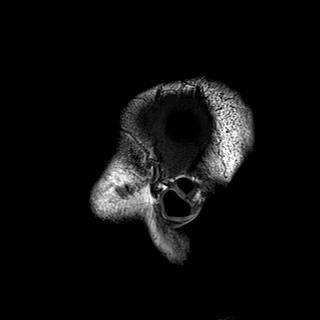

[Series 6: DWI · axial · 3.0mm · 0.96mm/px · z∈[-83,+63]mm · 4 of 100 slices shown (1 of 2)]
[im 1/100]
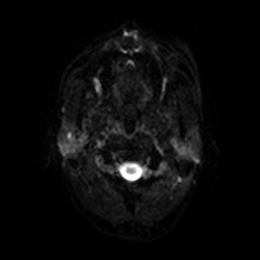
[im 34/100]
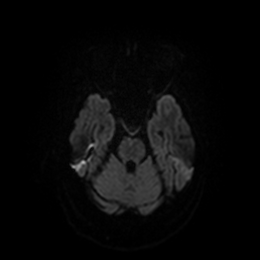
[im 67/100]
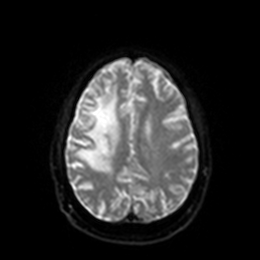
[im 100/100]
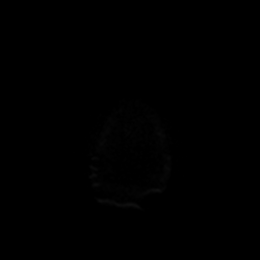

[Series 7: DWI · axial · 3.0mm · 0.96mm/px · z∈[-83,+63]mm · 2 of 50 slices shown (2 of 2)]
[im 1/50]
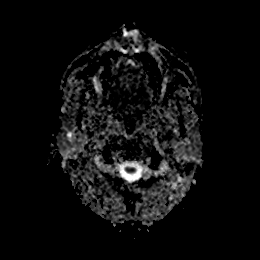
[im 50/50]
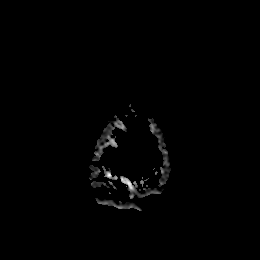

[Series 8: T2 · axial · 5.0mm · 0.72mm/px · 1 of 25 slices shown]
[im 1/25]
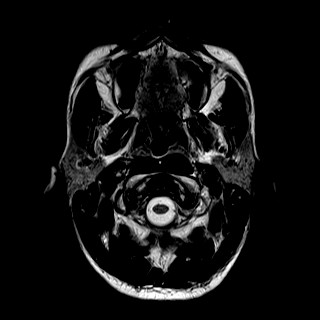

[Series 9: FLAIR · axial · 2.0mm · 0.49mm/px · z∈[-95,+85]mm · 3 of 76 slices shown (2 of 4)]
[im 1/76]
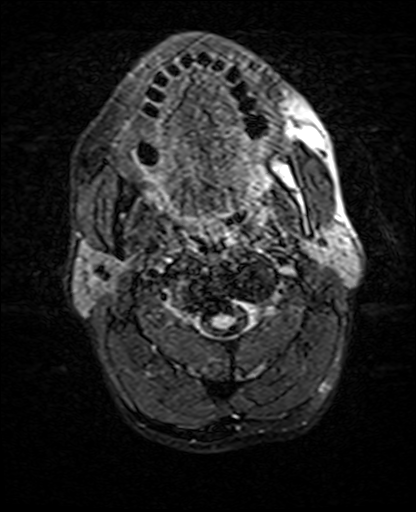
[im 38/76]
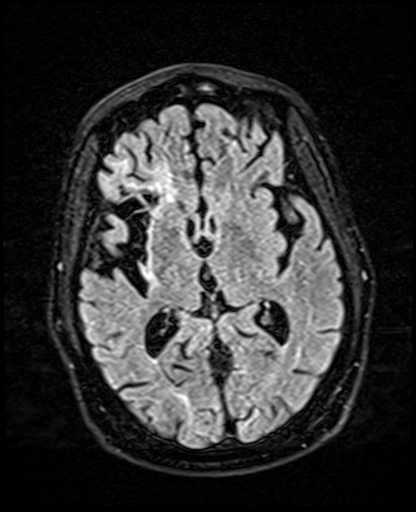
[im 76/76]
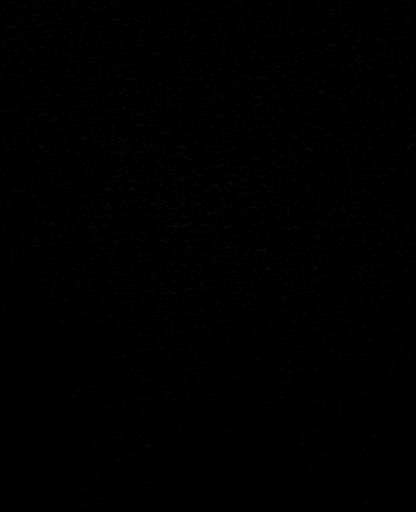

[Series 10: FLAIR · axial · 2.0mm · 0.49mm/px · z∈[-95,+85]mm · 3 of 76 slices shown (3 of 4)]
[im 1/76]
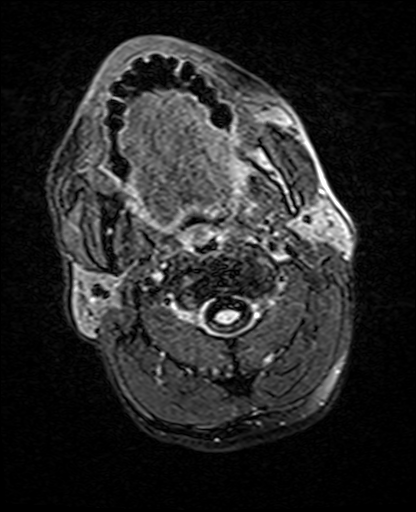
[im 38/76]
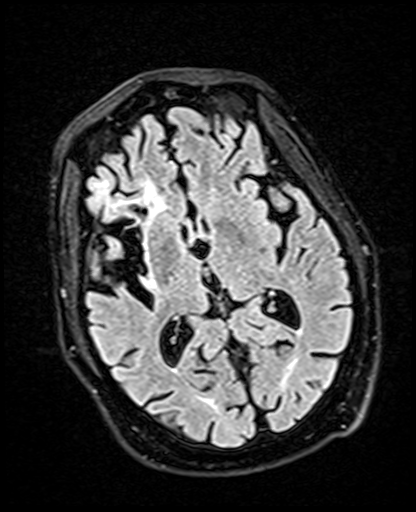
[im 76/76]
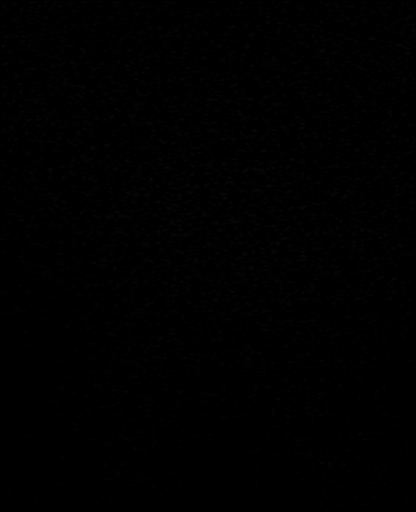

[Series 11: t1_fl3d_sag_p2_iso · sagittal · 1.0mm · 0.94mm/px · 7 of 187 slices shown]
[im 1/187]
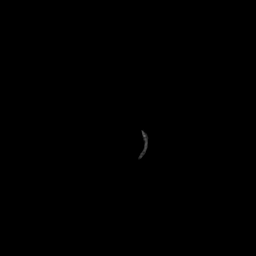
[im 32/187]
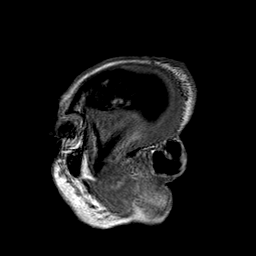
[im 63/187]
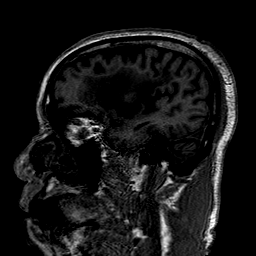
[im 94/187]
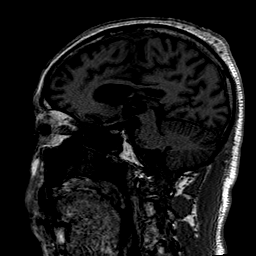
[im 125/187]
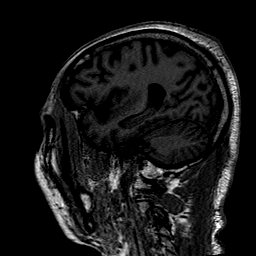
[im 156/187]
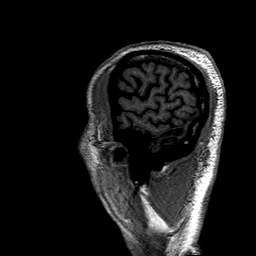
[im 187/187]
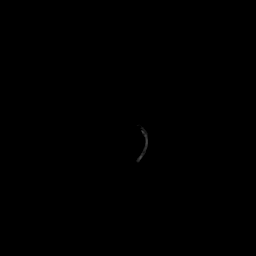

[Series 12: t1_fl3d_sag_p2_iso_mpr_ axial · axial · 2.0mm · 0.47mm/px · z∈[-138,+90]mm · 4 of 115 slices shown]
[im 1/115]
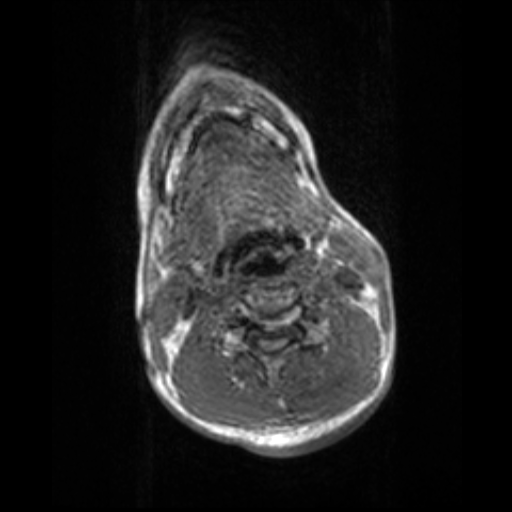
[im 39/115]
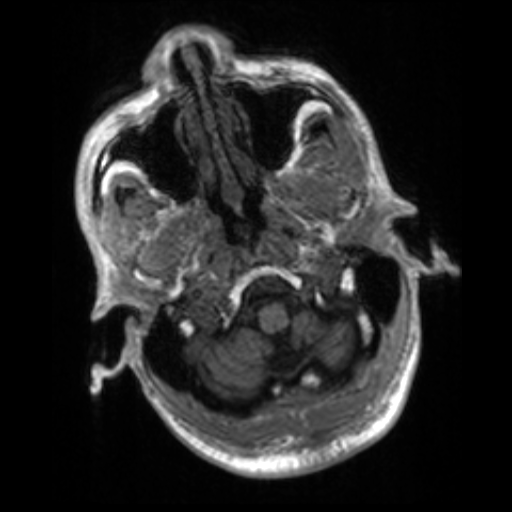
[im 77/115]
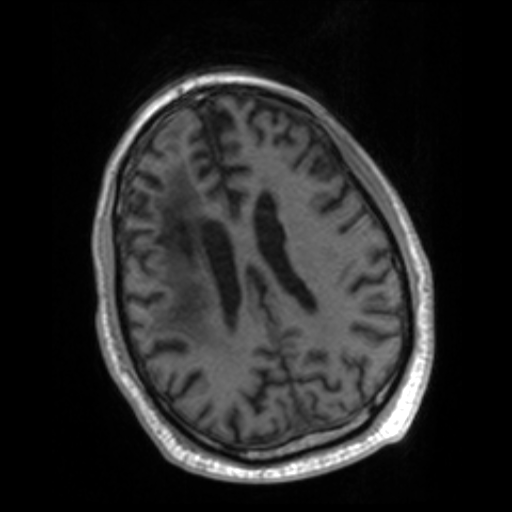
[im 115/115]
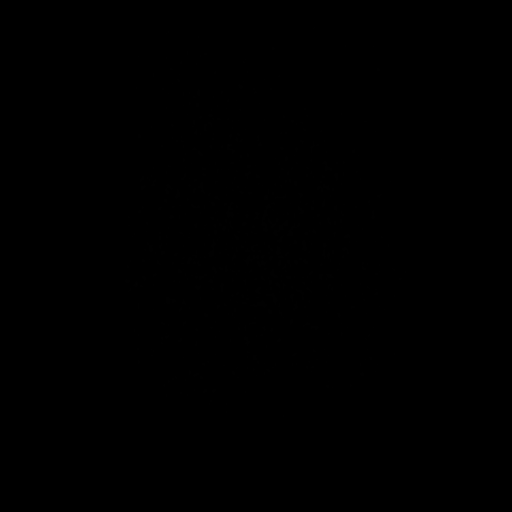

[Series 13: t1_fl3d_sag_p2_iso_mpr_coronal · coronal · 2.0mm · 0.45mm/px · 2 of 120 slices shown]
[im 1/120]
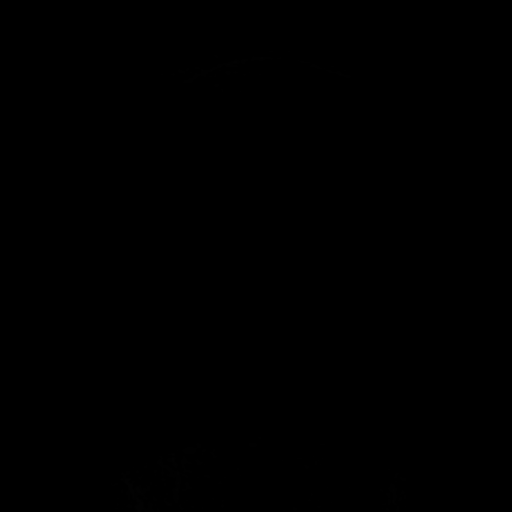
[im 40/120]
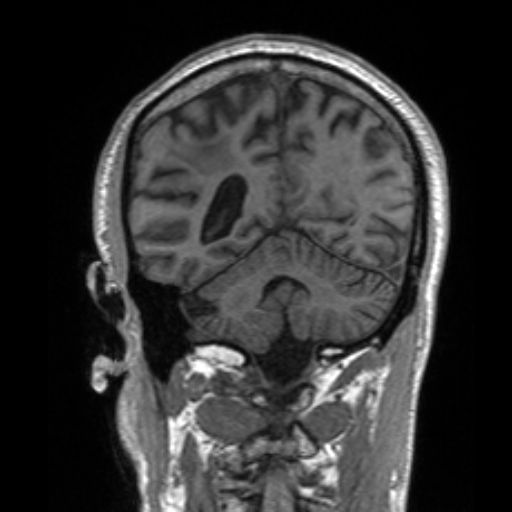

[Series 15: T2 post-contrast · axial · 5.0mm · 0.72mm/px · 1 of 25 slices shown (1 of 2)]
[im 1/25]
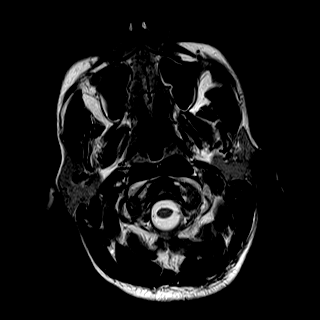

[Series 16: FLAIR · sagittal · 3.0mm · 0.78mm/px · 1 of 37 slices shown (4 of 4)]
[im 1/37]
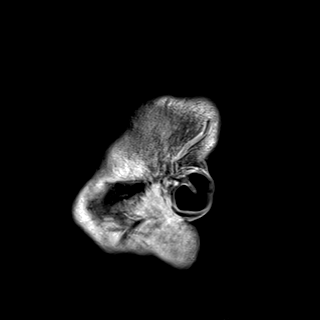

[Series 17: T2 post-contrast · coronal · 5.0mm · 0.34mm/px · 1 of 31 slices shown (2 of 2)]
[im 1/31]
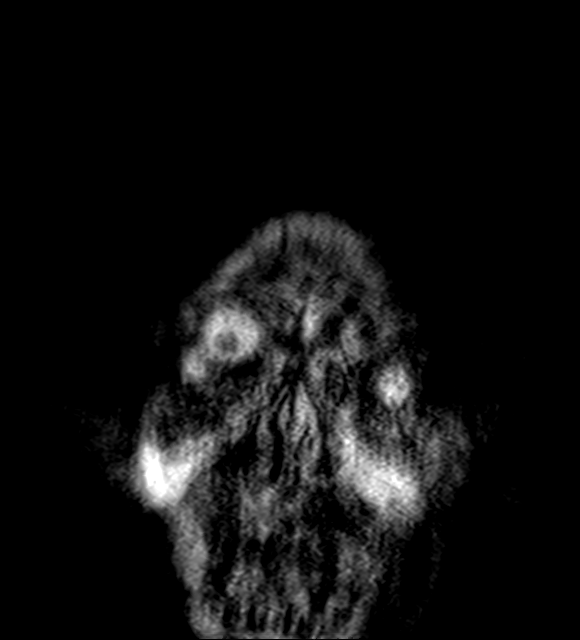

[Series 18: T1 post-contrast · coronal · 5.0mm · 0.43mm/px · 1 of 32 slices shown]
[im 1/32]
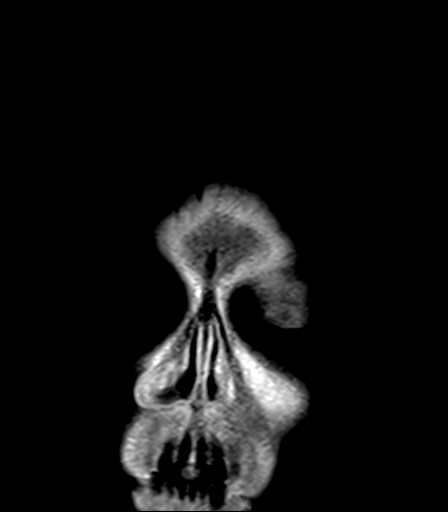

[31 of 48 positions shown; findings below may reference images not displayed]

FINDINGS: Brain: No acute infarction, hemorrhage, hydrocephalus or extra-axial
collection.

Significant interval increase in size of the right superior frontal
gyrus enhancing lesion (series 20, image 88), now measuring 6 mm
(1-2 mm on prior).

There is mild interval decrease in thickness of the area of contrast
enhancement involving the ventral aspect of the pons extending
inferiorly to the pontomedullary junction measuring up to 6 mm in
thickness (9 mm on prior). No other area of abnormal contrast
enhancement.

Unchanged right MCA territory infarct. Mild parenchymal volume loss.

Vascular: Normal flow voids.

Skull and upper cervical spine: Normal marrow signal.

Sinuses/Orbits: Negative.

Other: None.
IMPRESSION: 1. Significant interval increase in size of the right superior
frontal gyrus enhancing lesion, now measuring 6 mm (1-2 mm on
prior).
2. Mild interval decrease in thickness of the area of contrast
enhancement involving the ventral aspect of the pons extending
inferiorly to the pontomedullary junction measuring up to 6 mm in
thickness (9 mm on prior).
3. Unchanged right MCA territory infarct.

## 2020-08-24 MED ORDER — GADOBUTROL 1 MMOL/ML IV SOLN
6.9000 mL | Freq: Once | INTRAVENOUS | Status: AC | PRN
  Administered 2020-08-24: 6.9 mL via INTRAVENOUS

## 2020-08-24 NOTE — Progress Notes (Addendum)
Patient reprogrammed. Tachy therapies enabled. Transmission sent and patient ambulatory at discharge. Patient requested that reading physician contact his son whom the patient states is a doctor. His name is Darold Miley and his number is [phone] This RN gave the patient the generic number for [facility] to sort out this situation pertaining to getting in contact with his son.

## 2020-08-24 NOTE — Progress Notes (Signed)
Patient has Unsafe ICD. Per Dr. Sallyanne Kuster in Cardiology the [facility]. [employer] rep will turn off ICD for scanning. Ok'd for scanning per Dr. Sallyanne Kuster. Dr. Carlis Abbott in radiology aware of exam. RN to monitor patient during exam. Patient had prev. MRI under exact same setup and circumstances on 07/20/20.

## 2020-08-24 NOTE — Progress Notes (Signed)
This RN to monitor patient only for MRI due to unsafe ICD. This scan was verified by Dr. Sallyanne Kuster- Cardiologist. Dionne Milo- [facility]. [employer] rep at the bedside to program patient and will come back post MRI scan to program patient. Tachy therapies disable and ICD off at this time.

## 2020-08-27 ENCOUNTER — Inpatient Hospital Stay: Payer: Medicare Other | Attending: Radiation Oncology | Admitting: Internal Medicine

## 2020-08-27 ENCOUNTER — Other Ambulatory Visit: Payer: Self-pay

## 2020-08-27 DIAGNOSIS — R4781 Slurred speech: Secondary | ICD-10-CM | POA: Diagnosis not present

## 2020-08-27 DIAGNOSIS — R2681 Unsteadiness on feet: Secondary | ICD-10-CM

## 2020-08-27 DIAGNOSIS — C7931 Secondary malignant neoplasm of brain: Secondary | ICD-10-CM | POA: Insufficient documentation

## 2020-08-27 DIAGNOSIS — C342 Malignant neoplasm of middle lobe, bronchus or lung: Secondary | ICD-10-CM | POA: Diagnosis not present

## 2020-08-27 MED ORDER — DEXAMETHASONE 2 MG PO TABS: 2.0000 mg | ORAL_TABLET | Freq: Every day | ORAL | 0 refills | Status: DC

## 2020-08-27 NOTE — Progress Notes (Signed)
[facility] [place], [place] [address] [phone]   Interval Evaluation  Date of Service: 08/27/20 Patient Name: Dennis Sally. Patient MRN: [id number] Patient DOB: 1961/05/15 Provider: Ventura Sellers, MD  Identifying Statement:  Dennis Saha. is a 59 y.o. male with brain metastases   Primary Cancer:  Oncologic History: Oncology History  Small cell lung cancer, right upper lobe ([place])  01/19/2020 Initial Diagnosis   Small cell lung cancer, right upper lobe ([place])   01/31/2020 -  Chemotherapy   The patient had palonosetron (ALOXI) injection 0.25 mg, 0.25 mg, Intravenous,  Once, 4 of 4 cycles Administration: 0.25 mg (01/31/2020), 0.25 mg (03/14/2020), 0.25 mg (04/03/2020), 0.25 mg (02/21/2020) pegfilgrastim-cbqv (UDENYCA) injection 6 mg, 6 mg, Subcutaneous, Once, 4 of 4 cycles Administration: 6 mg (02/04/2020), 6 mg (02/25/2020), 6 mg (03/20/2020), 6 mg (04/07/2020) CARBOplatin (PARAPLATIN) 370 mg in sodium chloride 0.9 % 250 mL chemo infusion, 370 mg (100 % of original dose 367 mg), Intravenous,  Once, 4 of 4 cycles Dose modification: 367 mg (original dose 367 mg, Cycle 1), 430.5 mg (original dose 430.5 mg, Cycle 3), 419.5 mg (original dose 419.5 mg, Cycle 4), 357 mg (original dose 357 mg, Cycle 2) Administration: 370 mg (01/31/2020), 390 mg (03/14/2020), 420 mg (04/03/2020), 360 mg (02/21/2020) etoposide (VEPESID) 190 mg in sodium chloride 0.9 % 500 mL chemo infusion, 100 mg/m2 = 190 mg, Intravenous,  Once, 4 of 4 cycles Administration: 190 mg (01/31/2020), 190 mg (02/01/2020), 190 mg (02/02/2020), 190 mg (03/14/2020), 190 mg (03/15/2020), 190 mg (03/16/2020), 190 mg (04/03/2020), 190 mg (04/04/2020), 190 mg (04/05/2020), 190 mg (02/21/2020), 190 mg (02/22/2020), 190 mg (02/23/2020) fosaprepitant (EMEND) 150 mg in sodium chloride 0.9 % 145 mL IVPB, 150 mg, Intravenous,  Once, 4 of 4 cycles Administration: 150 mg (01/31/2020), 150 mg (03/14/2020), 150 mg  (04/03/2020), 150 mg (02/21/2020) durvalumab (IMFINZI) 1,500 mg in sodium chloride 0.9 % 100 mL chemo infusion, 1,500 mg, Intravenous,  Once, 7 of 13 cycles Administration: 1,500 mg (01/31/2020), 1,500 mg (03/14/2020), 1,500 mg (04/03/2020), 1,500 mg (04/24/2020), 1,500 mg (05/23/2020), 1,500 mg (06/21/2020), 1,500 mg (02/21/2020)  for chemotherapy treatment.     CNS Oncologic Hx 01/17/20: Completes WBRT 07/07/20: CT head demonstrates pontine lesion of unclear etiology  Interval History:  Dennis Adley Mazurowski. presents to clinic today following recent MRI brain.  He feels somewhat improved with regards to his gait imbalance and slurred speech since starting the steroids.  He is currently taking 18m per day of decadron without complication or fluid retention issues.  He remains independent and active otherwise.  No seizures or headaches.  H+P (07/27/20) Patient presents to review recent neurologic symptoms and abnormal findings on CNS imaging.  He describes recent hospitalization, for which he presented on 07/08/20 with several days of progressive gait impairment, slurred speech and left sided numbness.  CT was performed which demonstrated anterior pontine lesion, consistent with possible metastasis.  Cardiology monitored MRI was then performed with AICD turned off, redemonstrating region of abnormality.  He feels relatively improved since corticosteroids were started during the admission.  He is working with physical therapy to regain some strength, currently ambulating 100% with a walker due to the imbalance.  Aside from balance issues he is functionally independent at this time.    Medications: Current Outpatient Medications on File Prior to Visit  Medication Sig Dispense Refill  . Accu-Chek Softclix Lancets lancets     . atorvastatin (LIPITOR) 20 MG  tablet TAKE 1 TABLET BY MOUTH DAILY AT 6 PM. (Patient taking differently: Take 20 mg by mouth daily at 6 PM. ) 30 tablet 6  . Blood Glucose Monitoring Suppl  (FIFTY50 GLUCOSE METER 2.0) w/Device KIT Use as instructed    . feeding supplement (ENSURE ENLIVE / ENSURE PLUS) LIQD Take 237 mLs by mouth 2 (two) times daily between meals. 237 mL 0  . insulin glargine (LANTUS SOLOSTAR) 100 UNIT/ML Solostar Pen Inject 25 Units into the skin 2 (two) times daily. 15 mL 2  . insulin lispro (HUMALOG KWIKPEN) 100 UNIT/ML KwikPen Inject 6 Units into the skin 3 (three) times daily. 15 mL 1  . INSUPEN ULTRAFIN 31G X 8 MM MISC     . midodrine (PROAMATINE) 10 MG tablet Take 1 tablet (10 mg total) by mouth 3 (three) times daily with meals. 90 tablet 2  . prochlorperazine (COMPAZINE) 10 MG tablet Take 1 tablet (10 mg total) by mouth every 6 (six) hours as needed. 10 tablet 1  . sennosides-docusate sodium (SENOKOT-S) 8.6-50 MG tablet Take 1 tablet by mouth daily. As needed    . sodium chloride 1 g tablet Take 2 tablets (2 g total) by mouth 3 (three) times daily with meals. 180 tablet 1  . warfarin (COUMADIN) 7.5 MG tablet TAKE 1 TABLET BY MOUTH DAILY AT 6PM. (Patient taking differently: Take 7.5 mg by mouth every evening. ) 30 tablet 0   No current facility-administered medications on file prior to visit.    Allergies: No Known Allergies Past Medical History:  Past Medical History:  Diagnosis Date  . Alcohol abuse with alcohol-induced mood disorder ([place]) 08/18/2015  . Automatic implantable cardioverter-defibrillator in situ 2012   S/P St Jude Dual Chamber. ICD.  Marland Kitchen CAD (coronary artery disease) 11/16/2014   CAD Coronary angiography (12/2008) with mid LAD totally occluded and collateralized.  . Cardiac LV ejection fraction 10-20%   . Chest pain 09/04/2018  . CHF (congestive heart failure) ([place])   . Chronic systolic heart failure (HCC)    a) Mixed ICM/NICM b) RHC (05/2014): RA 2, RV 19/2/3, PA 22/14 (18), PCWP 6, Fick CO/CI: 5.2 / 2.7, PVR 2.3 WU, PA 60% and 64% c) ECHO (05/2014): EF 20-25%, diff HK, akinesis entireanteroseptal myocardium, triv AI, mod MR, LA mod/sev  dilated  . Cocaine abuse ([place]) 01/24/2015  . Cocaine abuse with cocaine-induced mood disorder ([place]) 08/20/2015  . Drug abuse and dependence ([place])   . Dysphagia following cerebral infarction 01/24/2015  . Embolic stroke involving right middle cerebral artery ([place])   . Extensive stage primary small cell carcinoma of lung ([place]) 01/04/2020  . H/O noncompliance with medical treatment, presenting hazards to health 01/24/2015  . Heart murmur   . High cholesterol   . Ischemic cardiomyopathy    a) Coronary angiography (12/2008) at [facility]: L[place]: nl, LAD mid 100% stenosis with left to left and right-to-left collaterals to the distal LAD; Lcx: nl, RCA nl.    . 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[place])   . MDD (major depressive disorder), recurrent severe, without psychosis ([place]) 08/18/2015  . Open wound of foot 02/27/2015  . Pneumonia 1988  . Psychoactive substance-induced mood disorder ([place]) 07/17/2015  . Sleep apnea    "cleared after T&A"  . Status post PICC central line placement   . Stroke ([place])   . Substance induced mood disorder ([place]) 08/17/2015  . Type II diabetes mellitus ([place])    Past Surgical History:  Past Surgical History:  Procedure Laterality Date  . BRONCHIAL  BRUSHINGS  12/28/2019   Procedure: BRONCHIAL BRUSHINGS;  Surgeon: Garner Nash, DO;  Location: [facility];  Service: Thoracic;;  . BRONCHIAL NEEDLE ASPIRATION BIOPSY  12/28/2019   Procedure: BRONCHIAL NEEDLE ASPIRATION BIOPSIES;  Surgeon: Garner Nash, DO;  Location: [facility];  Service: Thoracic;;  . BRONCHIAL WASHINGS  12/28/2019   Procedure: BRONCHIAL WASHINGS;  Surgeon: Garner Nash, DO;  Location: [facility];  Service: Thoracic;;  . CARDIAC CATHETERIZATION  05/2014  . CARDIAC DEFIBRILLATOR PLACEMENT  03/2011  . CHOLECYSTECTOMY  1994  . FOREARM FRACTURE SURGERY Left 1980  . FRACTURE SURGERY    . RIGHT HEART CATHETERIZATION N/A 05/30/2014   Procedure: RIGHT HEART CATH;  Surgeon: Jolaine Artist, MD;  Location: [facility]  CATH LAB;  Service: Cardiovascular;  Laterality: N/A;  . RIGHT HEART CATHETERIZATION N/A 02/07/2015   Procedure: RIGHT HEART CATH;  Surgeon: Jolaine Artist, MD;  Location[facility] CATH LAB;  Service: Cardiovascular;  Laterality: N/A;  . TONSILLECTOMY AND ADENOIDECTOMY  ~ 1998  . VIDEO BRONCHOSCOPY WITH ENDOBRONCHIAL ULTRASOUND N/A 12/28/2019   Procedure: VIDEO BRONCHOSCOPY WITH ENDOBRONCHIAL ULTRASOUND;  Surgeon: Garner Nash, DO;  Location: [facility];  Service: Thoracic;  Laterality: N/A;   Social History:  Social History   Socioeconomic History  . Marital status: Single    Spouse name: Not on file  . Number of children: 1  . Years of education: 16  . Highest education level: Not on file  Occupational History  . Occupation: Disabled  Tobacco Use  . Smoking status: Former Smoker    Packs/day: 0.25    Years: 31.00    Pack years: 7.75    Types: Cigarettes  . Smokeless tobacco: Never Used  Vaping Use  . Vaping Use: Never used  Substance and Sexual Activity  . Alcohol use: Not Currently    Alcohol/week: 0.0 standard drinks    Comment: 09/21/2014 "might have a drink q 6 /months, if that"  . Drug use: Yes    Types: Cocaine    Comment: last use 07/06/2020  . Sexual activity: Yes  Other Topics Concern  . Not on file  Social History Narrative   Lives in [place] with his mother. No pets. Fun: movies, sports, daughter.    Denies religious beliefs that would effect health care.    Social Determinants of Health   Financial Resource Strain:   . Difficulty of Paying Living Expenses: Not on file  Food Insecurity:   . Worried About Charity fundraiser in the Last Year: Not on file  . Ran Out of Food in the Last Year: Not on file  Transportation Needs:   . Lack of Transportation (Medical): Not on file  . Lack of Transportation (Non-Medical): Not on file  Physical Activity:   . Days of Exercise per Week: Not on file  . Minutes of Exercise per Session: Not on file  Stress:    . Feeling of Stress : Not on file  Social Connections:   . Frequency of Communication with Friends and Family: Not on file  . Frequency of Social Gatherings with Friends and Family: Not on file  . Attends Religious Services: Not on file  . Active Member of Clubs or Organizations: Not on file  . Attends Archivist Meetings: Not on file  . Marital Status: Not on file  Intimate Partner Violence:   . Fear of Current or Ex-Partner: Not on file  . Emotionally Abused: Not on file  . Physically Abused: Not  on file  . Sexually Abused: Not on file   Family History:  Family History  Problem Relation Age of Onset  . Diabetes Mother   . Heart disease Mother   . Diabetes Father   . Prostate cancer Father   . Heart disease Father   . Diabetes Brother   . Diabetes Brother     Review of Systems: Constitutional: Doesn't report fevers, chills or abnormal weight loss Eyes: Doesn't report blurriness of vision Ears, nose, mouth, throat, and face: Doesn't report sore throat Respiratory: Doesn't report cough, dyspnea or wheezes Cardiovascular: Doesn't report palpitation, chest discomfort  Gastrointestinal:  Doesn't report nausea, constipation, diarrhea GU: Doesn't report incontinence Skin: Doesn't report skin rashes Neurological: Per HPI Musculoskeletal: Doesn't report joint pain Behavioral/Psych: Doesn't report anxiety  Physical Exam: Vitals:   08/27/20 1104  BP: 112/82  Pulse: 99  Resp: 18  Temp: (!) 97.2 F (36.2 C)  SpO2: 99%   KPS: 70. General: Alert, cooperative, pleasant, in no acute distress Head: Normal EENT: No conjunctival injection or scleral icterus.  Lungs: Resp effort normal Cardiac: Regular rate Abdomen: Non-distended abdomen Skin: No rashes cyanosis or petechiae. Extremities: No clubbing or edema  Neurologic Exam: Mental Status: Awake, alert, attentive to examiner. Oriented to self and environment. Language is fluent with intact comprehension.   Cranial Nerves: Visual acuity is grossly normal. Visual fields are full. Extra-ocular movements intact. No ptosis. Face is symmetric Motor: Tone and bulk are normal. Power is full in both arms and legs. Reflexes are symmetric, no pathologic reflexes present.  Sensory: Intact to light touch Gait: Dystaxic, not independent  Labs: I have reviewed the data as listed    Component Value Date/Time   NA 136 07/23/2020 0513   K 4.5 07/23/2020 0513   CL 99 07/23/2020 0513   CO2 29 07/23/2020 0513   GLUCOSE 164 (H) 07/23/2020 0513   BUN 39 (H) 07/23/2020 0513   CREATININE 0.91 07/23/2020 0513   CREATININE 1.53 (H) 06/20/2020 1051   CALCIUM 9.5 07/23/2020 0513   PROT 6.8 07/08/2020 0542   ALBUMIN 2.9 (L) 07/16/2020 0543   AST 18 07/08/2020 0542   AST 10 (L) 06/20/2020 1051   ALT 16 07/08/2020 0542   ALT 8 06/20/2020 1051   ALKPHOS 83 07/08/2020 0542   BILITOT 0.7 07/08/2020 0542   BILITOT 0.3 06/20/2020 1051   GFRNONAA >60 07/23/2020 0513   GFRNONAA 49 (L) 06/20/2020 1051   GFRAA >60 07/16/2020 0543   GFRAA 57 (L) 06/20/2020 1051   Lab Results  Component Value Date   WBC 15.6 (H) 07/23/2020   NEUTROABS 5.5 07/11/2020   HGB 10.4 (L) 07/23/2020   HCT 33.4 (L) 07/23/2020   MCV 96.8 07/23/2020   PLT 285 07/23/2020    Imaging:  MR BRAIN W WO CONTRAST  Result Date: 08/24/2020 CLINICAL DATA:  Brain/CNS neoplasm.  Assess treatment response. EXAM: MRI HEAD WITHOUT AND WITH CONTRAST TECHNIQUE: Multiplanar, multiecho pulse sequences of the brain and surrounding structures were obtained without and with intravenous contrast. CONTRAST:  6.41m GADAVIST GADOBUTROL 1 MMOL/ML IV SOLN COMPARISON:  MRI of the brain July 20, 2020. FINDINGS: Brain: No acute infarction, hemorrhage, hydrocephalus or extra-axial collection. Significant interval increase in size of the right superior frontal gyrus enhancing lesion (series 20, image 88), now measuring 6 mm (1-2 mm on prior). There is mild interval  decrease in thickness of the area of contrast enhancement involving the ventral aspect of the pons extending inferiorly to the pontomedullary junction measuring  up to 6 mm in thickness (9 mm on prior). No other area of abnormal contrast enhancement. Unchanged right MCA territory infarct. Mild parenchymal volume loss. Vascular: Normal flow voids. Skull and upper cervical spine: Normal marrow signal. Sinuses/Orbits: Negative. Other: None. IMPRESSION: 1. Significant interval increase in size of the right superior frontal gyrus enhancing lesion, now measuring 6 mm (1-2 mm on prior). 2. Mild interval decrease in thickness of the area of contrast enhancement involving the ventral aspect of the pons extending inferiorly to the pontomedullary junction measuring up to 6 mm in thickness (9 mm on prior). 3. Unchanged right MCA territory infarct. Electronically Signed   By: Pedro Earls M.D.   On: 08/24/2020 17:54   CUP PACEART REMOTE DEVICE CHECK  Result Date: 08/07/2020 Scheduled remote reviewed. Normal device function.  Longest AMS shows noise on RA lead Battery estimated 3.3 months, routing to triage to increase FU Next remote 30days.   Tea Clinician Interpretation: I have personally reviewed the radiological images as listed.  My interpretation, in the context of the patient's clinical presentation, is treatment effect vs true progression    Assessment/Plan Brain Metastases  Dennis Fadel Clason. is clinically stable today, now ~6 weeks removed from initial diagnosis of enhancing brainstem lesion.  MRI demonstrates stable findings, aside from progression within previously treated R frontal region.  Etiology of pontine base mass remains either unusual inflammatory process s/p WBRT, or novel metastasis.  Progressive right frontal lesion is asymptomatic and may be reactive in nature.  Again, treating aggressively with radiosurgery in this location would cause significant harm if the etiology is  non-neoplastic.   We will recommend continuing steroids but decreasing decadron to 29m daily, especially given his advanced heart failure.  We ask that Dennis [name] return to clinic in 2 months following next brain MRI, or sooner as needed.  We appreciate the opportunity to participate in the care of Dennis [name].Marland Kitchen He will also continue to follow up with Dr. MJulien Nordmannfor lung cancer management.  All questions were answered. The patient knows to call the clinic with any problems, questions or concerns. No barriers to learning were detected.  The total time spent in the encounter was 40 minutes and more than 50% was on counseling and review of test results   ZVentura Sellers MD Medical Director of Neuro-Oncology [facility]11/15/21 3:52 PM

## 2020-08-28 ENCOUNTER — Telehealth: Payer: Self-pay | Admitting: Internal Medicine

## 2020-08-28 NOTE — Telephone Encounter (Signed)
Scheduled per 11/15 los. Unable to reach pt. Left voicemail with appt time and date.

## 2020-08-29 ENCOUNTER — Ambulatory Visit (INDEPENDENT_AMBULATORY_CARE_PROVIDER_SITE_OTHER): Payer: Medicare Other | Admitting: *Deleted

## 2020-08-29 ENCOUNTER — Other Ambulatory Visit: Payer: Self-pay

## 2020-08-29 DIAGNOSIS — I428 Other cardiomyopathies: Secondary | ICD-10-CM | POA: Diagnosis not present

## 2020-08-29 DIAGNOSIS — Z7901 Long term (current) use of anticoagulants: Secondary | ICD-10-CM

## 2020-08-29 LAB — POCT INR: INR: 2.6 (ref 2.0–3.0)

## 2020-08-29 NOTE — Patient Instructions (Addendum)
Description   Continue taking 1/2 tablet daily except 1 tablet on Tuesdays. Recheck in 3 weeks.  Main # [phone] Coumadin Clinic # [phone].

## 2020-08-30 ENCOUNTER — Encounter: Payer: Self-pay | Admitting: Internal Medicine

## 2020-08-30 ENCOUNTER — Other Ambulatory Visit: Payer: Self-pay

## 2020-08-30 ENCOUNTER — Inpatient Hospital Stay (HOSPITAL_BASED_OUTPATIENT_CLINIC_OR_DEPARTMENT_OTHER): Payer: Medicare Other | Admitting: Internal Medicine

## 2020-08-30 VITALS — BP 104/80 | HR 99 | Temp 97.8°F | Resp 18 | Ht 72.0 in | Wt 164.6 lb

## 2020-08-30 DIAGNOSIS — C3411 Malignant neoplasm of upper lobe, right bronchus or lung: Secondary | ICD-10-CM | POA: Diagnosis not present

## 2020-08-30 DIAGNOSIS — C7931 Secondary malignant neoplasm of brain: Secondary | ICD-10-CM

## 2020-08-30 DIAGNOSIS — Z5112 Encounter for antineoplastic immunotherapy: Secondary | ICD-10-CM

## 2020-08-30 DIAGNOSIS — C3490 Malignant neoplasm of unspecified part of unspecified bronchus or lung: Secondary | ICD-10-CM

## 2020-08-30 DIAGNOSIS — C342 Malignant neoplasm of middle lobe, bronchus or lung: Secondary | ICD-10-CM | POA: Diagnosis not present

## 2020-08-30 NOTE — Progress Notes (Signed)
[place] [phone]   [phone]  OFFICE PROGRESS NOTE  [name], MD [place] [id number]  DIAGNOSIS: Extensive stage small cell lung cancer. He presented with a spiculated right middle lobe pulmonary nodule and mildly enlargedsolitary aortopulmonary lymph node. 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 He was diagnosed in March 2021.  PRIOR THERAPY: 1) Whole brain radiation under the care of Dr. Lisbeth Renshaw. Completed 01/17/2020  CURRENT THERAPY: Systemic chemotherapy withcarboplatin for an AUC of 5 on day 1,etoposide 100 mg/m on days 1, 2, and 3, and Imfinzi 1500 mg on day 1 IV every 3 weeks. First dose expected on4/20/21. Status post 7  cycles.  Starting from cycle #5 the patient will be treated with maintenance Imfinzi 1500 mg IV every 4 weeks.  INTERVAL HISTORY: Dennis [name]. 59 y.o. male returns to the clinic today for follow-up visit.  The patient has been off treatment for the last 2 months after he was admitted to the hospital with instability of his gait and weakness.  He had imaging studies including CT scan of the head with and without contrast on 07/10/2020 and it showed what appears to be infiltrating enhancement in the ventral pons and proximal medulla concerning for metastatic disease.  I had MRI of the brain on July 20, 2020 that showed the newly seen abnormal enhancement affecting the ventral bones from the pontomedullary junction to the mid brain.  This presumed to represent metastatic disease and the possibility of treatment effect was considered less likely.  He had resolution of the other right frontal metastasis.  The patient was followed by Dr. Mickeal Skinner and started on a tapered dose of prednisone.  He is currently on Decadron 2 mg p.o. daily.  The patient is feeling much better and he is now able to ambulate without assistant.  He denied  having any current chest pain, shortness of breath, cough or hemoptysis.  He denied having any nausea, vomiting, diarrhea or constipation.  He has no headache or visual changes.  He is here today for reevaluation and recommendation regarding his treatment.   MEDICAL HISTORY: Past Medical History:  Diagnosis Date  . Alcohol abuse with alcohol-induced mood disorder ([place]) 08/18/2015  . Automatic implantable cardioverter-defibrillator in situ 2012   S/P St Jude Dual Chamber. ICD.  Marland Kitchen CAD (coronary artery disease) 11/16/2014   CAD Coronary angiography (12/2008) with mid LAD totally occluded and collateralized.  . Cardiac LV ejection fraction 10-20%   . Chest pain 09/04/2018  . CHF (congestive heart failure) ([place])   . Chronic systolic heart failure (HCC)    a) Mixed ICM/NICM b) RHC (05/2014): RA 2, RV 19/2/3, PA 22/14 (18), PCWP 6, Fick CO/CI: 5.2 / 2.7, PVR 2.3 WU, PA 60% and 64% c) ECHO (05/2014): EF 20-25%, diff HK, akinesis entireanteroseptal myocardium, triv AI, mod MR, LA mod/sev dilated  . Cocaine abuse ([place]) 01/24/2015  . Cocaine abuse with cocaine-induced mood disorder ([place]) 08/20/2015  . Drug abuse and dependence ([place])   . Dysphagia following cerebral infarction 01/24/2015  . Embolic stroke involving right middle cerebral artery ([place])   . Extensive stage primary small cell carcinoma of lung ([place]) 01/04/2020  . H/O noncompliance with medical treatment, presenting hazards to health 01/24/2015  . Heart murmur   . High cholesterol   . Ischemic cardiomyopathy    a) Coronary angiography (12/2008) at [facility]: Lmain: nl, LAD  mid 100% stenosis with left to left and right-to-left collaterals to the distal LAD; Lcx: nl, RCA nl.    . 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[place])   . MDD (major depressive disorder), recurrent severe, without psychosis ([place]) 08/18/2015  . Open wound of foot 02/27/2015  . Pneumonia 1988  . Psychoactive substance-induced mood disorder ([place]) 07/17/2015  . Sleep apnea    "cleared after  T&A"  . Status post PICC central line placement   . Stroke ([place])   . Substance induced mood disorder ([place]) 08/17/2015  . Type II diabetes mellitus (HCC)     ALLERGIES:  has No Known Allergies.  MEDICATIONS:  Current Outpatient Medications  Medication Sig Dispense Refill  . Accu-Chek Softclix Lancets lancets     . atorvastatin (LIPITOR) 20 MG tablet TAKE 1 TABLET BY MOUTH DAILY AT 6 PM. (Patient taking differently: Take 20 mg by mouth daily at 6 PM. ) 30 tablet 6  . Blood Glucose Monitoring Suppl (FIFTY50 GLUCOSE METER 2.0) w/Device KIT Use as instructed    . dexamethasone (DECADRON) 2 MG tablet Take 1 tablet (2 mg total) by mouth daily. 60 tablet 0  . feeding supplement (ENSURE ENLIVE / ENSURE PLUS) LIQD Take 237 mLs by mouth 2 (two) times daily between meals. 237 mL 0  . insulin glargine (LANTUS SOLOSTAR) 100 UNIT/ML Solostar Pen Inject 25 Units into the skin 2 (two) times daily. 15 mL 2  . insulin lispro (HUMALOG KWIKPEN) 100 UNIT/ML KwikPen Inject 6 Units into the skin 3 (three) times daily. 15 mL 1  . INSUPEN ULTRAFIN 31G X 8 MM MISC     . midodrine (PROAMATINE) 10 MG tablet Take 1 tablet (10 mg total) by mouth 3 (three) times daily with meals. 90 tablet 2  . prochlorperazine (COMPAZINE) 10 MG tablet Take 1 tablet (10 mg total) by mouth every 6 (six) hours as needed. 10 tablet 1  . sennosides-docusate sodium (SENOKOT-S) 8.6-50 MG tablet Take 1 tablet by mouth daily. As needed    . sodium chloride 1 g tablet Take 2 tablets (2 g total) by mouth 3 (three) times daily with meals. 180 tablet 1  . warfarin (COUMADIN) 7.5 MG tablet TAKE 1 TABLET BY MOUTH DAILY AT 6PM. (Patient taking differently: Take 7.5 mg by mouth every evening. ) 30 tablet 0   No current facility-administered medications for this visit.    SURGICAL HISTORY:  Past Surgical History:  Procedure Laterality Date  . BRONCHIAL BRUSHINGS  12/28/2019   Procedure: BRONCHIAL BRUSHINGS;  Surgeon: Garner Nash, DO;  Location:  [facility];  Service: Thoracic;;  . BRONCHIAL NEEDLE ASPIRATION BIOPSY  12/28/2019   Procedure: BRONCHIAL NEEDLE ASPIRATION BIOPSIES;  Surgeon: Garner Nash, DO;  Location: [facility];  Service: Thoracic;;  . BRONCHIAL WASHINGS  12/28/2019   Procedure: BRONCHIAL WASHINGS;  Surgeon: Garner Nash, DO;  Location: [facility];  Service: Thoracic;;  . CARDIAC CATHETERIZATION  05/2014  . CARDIAC DEFIBRILLATOR PLACEMENT  03/2011  . CHOLECYSTECTOMY  1994  . FOREARM FRACTURE SURGERY Left 1980  . FRACTURE SURGERY    . RIGHT HEART CATHETERIZATION N/A 05/30/2014   Procedure: RIGHT HEART CATH;  Surgeon: Jolaine Artist, MD;  Location: [facility] CATH LAB;  Service: Cardiovascular;  Laterality: N/A;  . RIGHT HEART CATHETERIZATION N/A 02/07/2015   Procedure: RIGHT HEART CATH;  Surgeon: Jolaine Artist, MD;  Location: [facility];  Service: Cardiovascular;  Laterality: N/A;  . TONSILLECTOMY AND ADENOIDECTOMY  ~ 1998  . VIDEO BRONCHOSCOPY WITH ENDOBRONCHIAL  ULTRASOUND N/A 12/28/2019   Procedure: VIDEO BRONCHOSCOPY WITH ENDOBRONCHIAL ULTRASOUND;  Surgeon: Garner Nash, DO;  Location: [facility];  Service: Thoracic;  Laterality: N/A;    REVIEW OF SYSTEMS:  Constitutional: positive for fatigue Eyes: negative Ears, nose, mouth, throat, and face: negative Respiratory: negative Cardiovascular: negative Gastrointestinal: negative Genitourinary:negative Integument/breast: negative Hematologic/lymphatic: negative Musculoskeletal:positive for muscle weakness Neurological: negative Behavioral/Psych: negative Endocrine: negative Allergic/Immunologic: negative   PHYSICAL EXAMINATION: General appearance: alert, cooperative and no distress Head: Normocephalic, without obvious abnormality, atraumatic Neck: no adenopathy, no JVD, supple, symmetrical, trachea midline and thyroid not enlarged, symmetric, no tenderness/mass/nodules Lymph nodes: Cervical, supraclavicular, and axillary nodes normal. Resp:  clear to auscultation bilaterally Back: symmetric, no curvature. ROM normal. No CVA tenderness. Cardio: regular rate and rhythm, S1, S2 normal, no murmur, click, rub or gallop GI: soft, non-tender; bowel sounds normal; no masses,  no organomegaly Extremities: extremities normal, atraumatic, no cyanosis or edema Neurologic: Alert and oriented X 3, normal strength and tone. Normal symmetric reflexes. Normal coordination and gait  ECOG PERFORMANCE STATUS: 1 - Symptomatic but completely ambulatory  Blood pressure 104/80, pulse 99, temperature 97.8 F (36.6 C), temperature source Tympanic, resp. rate 18, height 6' (1.829 m), weight 164 lb 9.6 oz (74.7 kg), SpO2 100 %.  LABORATORY DATA: Lab Results  Component Value Date   WBC 15.6 (H) 07/23/2020   HGB 10.4 (L) 07/23/2020   HCT 33.4 (L) 07/23/2020   MCV 96.8 07/23/2020   PLT 285 07/23/2020      Chemistry      Component Value Date/Time   NA 136 07/23/2020 0513   K 4.5 07/23/2020 0513   CL 99 07/23/2020 0513   CO2 29 07/23/2020 0513   BUN 39 (H) 07/23/2020 0513   CREATININE 0.91 07/23/2020 0513   CREATININE 1.53 (H) 06/20/2020 1051      Component Value Date/Time   CALCIUM 9.5 07/23/2020 0513   ALKPHOS 83 07/08/2020 0542   AST 18 07/08/2020 0542   AST 10 (L) 06/20/2020 1051   ALT 16 07/08/2020 0542   ALT 8 06/20/2020 1051   BILITOT 0.7 07/08/2020 0542   BILITOT 0.3 06/20/2020 1051       RADIOGRAPHIC STUDIES: MR BRAIN W WO CONTRAST  Result Date: 08/24/2020 CLINICAL DATA:  Brain/CNS neoplasm.  Assess treatment response. EXAM: MRI HEAD WITHOUT AND WITH CONTRAST TECHNIQUE: Multiplanar, multiecho pulse sequences of the brain and surrounding structures were obtained without and with intravenous contrast. CONTRAST:  6.29m GADAVIST GADOBUTROL 1 MMOL/ML IV SOLN COMPARISON:  MRI of the brain July 20, 2020. FINDINGS: Brain: No acute infarction, hemorrhage, hydrocephalus or extra-axial collection. Significant interval increase in size  of the right superior frontal gyrus enhancing lesion (series 20, image 88), now measuring 6 mm (1-2 mm on prior). There is mild interval decrease in thickness of the area of contrast enhancement involving the ventral aspect of the pons extending inferiorly to the pontomedullary junction measuring up to 6 mm in thickness (9 mm on prior). No other area of abnormal contrast enhancement. Unchanged right MCA territory infarct. Mild parenchymal volume loss. Vascular: Normal flow voids. Skull and upper cervical spine: Normal marrow signal. Sinuses/Orbits: Negative. Other: None. IMPRESSION: 1. Significant interval increase in size of the right superior frontal gyrus enhancing lesion, now measuring 6 mm (1-2 mm on prior). 2. Mild interval decrease in thickness of the area of contrast enhancement involving the ventral aspect of the pons extending inferiorly to the pontomedullary junction measuring up to 6 mm in thickness (9 mm  on prior). 3. Unchanged right MCA territory infarct. Electronically Signed   By: Pedro Earls M.D.   On: 08/24/2020 17:54   CUP PACEART REMOTE DEVICE CHECK  Result Date: 08/07/2020 Scheduled remote reviewed. Normal device function.  Longest AMS shows noise on RA lead Battery estimated 3.3 months, routing to triage to increase FU Next remote 30days.   ASSESSMENT AND PLAN: This is a very pleasant 59 years old African-American male recently diagnosed with extensive stage small cell lung cancer presented with spiculated right middle lobe pulmonary nodule in addition to mediastinal lymphadenopathy and multiple metastatic brain lesions.  The patient is status post whole brain irradiation. He is currently undergoing systemic chemotherapy with carboplatin, etoposide and Imfinzi status post 7 cycles.  Starting from cycle #5 the patient is on maintenance treatment with immunotherapy with Imfinzi every 4 weeks. The patient has been off treatment for more than 2 months now because of the  suspicious metastatic disease in the brain.  He is currently on a tapered dose of Decadron 2 mg p.o. daily. I recommended for the patient to have repeat CT scan of the chest, abdomen pelvis for restaging of his disease. I will consider resuming his treatment with Imfinzi in around 2 weeks. The patient will come back for follow-up visit at that time.  He was advised to call immediately if he has any concerning symptoms in the interval. The patient voices understanding of current disease status and treatment options and is in agreement with the current care plan.  All questions were answered. The patient knows to call the clinic with any problems, questions or concerns. We can certainly see the patient much sooner if necessary.  Disclaimer: This note was dictated with voice recognition software. Similar sounding words can inadvertently be transcribed and may not be corrected upon review.

## 2020-09-03 ENCOUNTER — Ambulatory Visit (INDEPENDENT_AMBULATORY_CARE_PROVIDER_SITE_OTHER): Payer: Medicare Other

## 2020-09-03 DIAGNOSIS — I255 Ischemic cardiomyopathy: Secondary | ICD-10-CM

## 2020-09-05 ENCOUNTER — Telehealth: Payer: Self-pay | Admitting: Emergency Medicine

## 2020-09-05 LAB — CUP PACEART REMOTE DEVICE CHECK
Battery Remaining Longevity: 0 mo
Battery Voltage: 2.59 V
Brady Statistic AP VP Percent: 1 %
Brady Statistic AP VS Percent: 1 %
Brady Statistic AS VP Percent: 1 %
Brady Statistic AS VS Percent: 99 %
Brady Statistic RA Percent Paced: 1 %
Brady Statistic RV Percent Paced: 1 %
Date Time Interrogation Session: 20211123134916
HighPow Impedance: 59 Ohm
HighPow Impedance: 59 Ohm
Implantable Lead Implant Date: 20110513
Implantable Lead Implant Date: 20110513
Implantable Lead Location: 753859
Implantable Lead Location: 753860
Implantable Pulse Generator Implant Date: 20110513
Lead Channel Impedance Value: 260 Ohm
Lead Channel Impedance Value: 300 Ohm
Lead Channel Pacing Threshold Amplitude: 0.75 V
Lead Channel Pacing Threshold Amplitude: 0.75 V
Lead Channel Pacing Threshold Pulse Width: 0.5 ms
Lead Channel Pacing Threshold Pulse Width: 0.5 ms
Lead Channel Sensing Intrinsic Amplitude: 12 mV
Lead Channel Sensing Intrinsic Amplitude: 5 mV
Lead Channel Setting Pacing Amplitude: 2 V
Lead Channel Setting Pacing Amplitude: 2.5 V
Lead Channel Setting Pacing Pulse Width: 0.5 ms
Lead Channel Setting Sensing Sensitivity: 0.5 mV
Pulse Gen Serial Number: 608100

## 2020-09-05 NOTE — Telephone Encounter (Signed)
Left detailed message on cell that monthly battery checks scheduled at no extra cost due to device near RRT. Device clinic # provided for any questions or concerns.

## 2020-09-07 ENCOUNTER — Telehealth: Payer: Self-pay | Admitting: Internal Medicine

## 2020-09-07 NOTE — Telephone Encounter (Signed)
Scheduled per los. Called and spoke with patient. Confirmed all appts  °

## 2020-09-08 NOTE — Progress Notes (Deleted)
[facility] OFFICE PROGRESS NOTE  Dennis Pee, MD [place] [address]  DIAGNOSIS: Extensive stage small cell lung cancer. He presented with a spiculated right middle lobe pulmonary nodule and mildly enlargedsolitary aortopulmonary lymph node. 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 He was diagnosed in March 2021.  PRIOR THERAPY: 1) Whole brain radiation under the care of Dr. Lisbeth Renshaw. Completed 01/17/2020  CURRENT THERAPY: Systemic chemotherapy withcarboplatin for an AUC of 5 on day 1,etoposide 100 mg/m on days 1, 2, and 3, and Imfinzi 1500 mg on day 1 IV every 3 weeks. First dose expected on4/20/21. Status post 7  cycles.  Starting from cycle #5 the patient will be treated with maintenance Imfinzi 1500 mg IV every 4 weeks. Treatment has been on hold for several months from cycle #7 due to hospitalizations.  INTERVAL HISTORY: Thoams Siefert. 59 y.o. male returns to the clinic today for a follow up visit. The patient is feeling _ today without any concerning complaints except for _. The patient was last seen in the clinic on 08/30/20 by Dr. Julien Nordmann. The patient had been of systemic treatment for 2 months  2 months after he was admitted to the hospital with instability of his gait and weakness.  He had imaging studies including CT scan of the head with and without contrast on 07/10/2020 and it showed what appears to be infiltrating enhancement in the ventral pons and proximal medulla concerning for possible metastatic disease. He had MRI of the brain on July 20, 2020 that showed the newly seen abnormal enhancement affecting the ventral bones from the pontomedullary junction to the mid brain.  This presumed to represent metastatic disease vs the possibility of treatment effect.  The patient is followed by Dr. Mickeal Skinner in neuro oncology and is scheduled for repeat imaging in January 2022. The patient  completed? a tapered dose of prednisone.  He is currently on Decadron 2 mg p.o. daily?Marland Kitchen    The patient was supposed to have a restaging CT scan before today's visit to restage his disease before considering resuming treatment with immunotherapy with Imfinzi. However, his scan is not scheduled until 09/17/20.   Otherwise, he is feeling _ today. He denies any fever, chills, night sweats, or weight loss. Denies any chest pain, shortness of breath, cough, or hemoptysis. Denies any nausea, vomiting, diarrhea, or constipation. Denies any headache or visual changes. Gait? Weakness? Denies any rashes or skin changes. The patient is here today for evaluation prior to starting cycle # 8   MEDICAL HISTORY: Past Medical History:  Diagnosis Date  . Alcohol abuse with alcohol-induced mood disorder ([place]) 08/18/2015  . Automatic implantable cardioverter-defibrillator in situ 2012   S/P St Jude Dual Chamber. ICD.  Marland Kitchen CAD (coronary artery disease) 11/16/2014   CAD Coronary angiography (12/2008) with mid LAD totally occluded and collateralized.  . Cardiac LV ejection fraction 10-20%   . Chest pain 09/04/2018  . CHF (congestive heart failure) ([place])   . Chronic systolic heart failure (HCC)    a) Mixed ICM/NICM b) RHC (05/2014): RA 2, RV 19/2/3, PA 22/14 (18), PCWP 6, Fick CO/CI: 5.2 / 2.7, PVR 2.3 WU, PA 60% and 64% c) ECHO (05/2014): EF 20-25%, diff HK, akinesis entireanteroseptal myocardium, triv AI, mod MR, LA mod/sev dilated  . Cocaine abuse ([place]) 01/24/2015  . Cocaine abuse with cocaine-induced mood disorder ([place]) 08/20/2015  . Drug abuse and dependence ([place])   .  Dysphagia following cerebral infarction 01/24/2015  . Embolic stroke involving right middle cerebral artery ([place])   . Extensive stage primary small cell carcinoma of lung ([place]) 01/04/2020  . H/O noncompliance with medical treatment, presenting hazards to health 01/24/2015  . Heart murmur   . High cholesterol   . Ischemic cardiomyopathy    a) Coronary  angiography (12/2008) at [facility]: Lmain: nl, LAD mid 100% stenosis with left to left and right-to-left collaterals to the distal LAD; Lcx: nl, RCA nl.    . 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[place])   . MDD (major depressive disorder), recurrent severe, without psychosis ([place]) 08/18/2015  . Open wound of foot 02/27/2015  . Pneumonia 1988  . Psychoactive substance-induced mood disorder ([place]) 07/17/2015  . Sleep apnea    "cleared after T&A"  . Status post PICC central line placement   . Stroke ([place])   . Substance induced mood disorder ([place]) 08/17/2015  . Type II diabetes mellitus (HCC)     ALLERGIES:  has No Known Allergies.  MEDICATIONS:  Current Outpatient Medications  Medication Sig Dispense Refill  . Accu-Chek Softclix Lancets lancets     . atorvastatin (LIPITOR) 20 MG tablet TAKE 1 TABLET BY MOUTH DAILY AT 6 PM. (Patient taking differently: Take 20 mg by mouth daily at 6 PM. ) 30 tablet 6  . Blood Glucose Monitoring Suppl (FIFTY50 GLUCOSE METER 2.0) w/Device KIT Use as instructed    . dexamethasone (DECADRON) 2 MG tablet Take 1 tablet (2 mg total) by mouth daily. 60 tablet 0  . feeding supplement (ENSURE ENLIVE / ENSURE PLUS) LIQD Take 237 mLs by mouth 2 (two) times daily between meals. 237 mL 0  . insulin glargine (LANTUS SOLOSTAR) 100 UNIT/ML Solostar Pen Inject 25 Units into the skin 2 (two) times daily. 15 mL 2  . insulin lispro (HUMALOG KWIKPEN) 100 UNIT/ML KwikPen Inject 6 Units into the skin 3 (three) times daily. 15 mL 1  . INSUPEN ULTRAFIN 31G X 8 MM MISC     . midodrine (PROAMATINE) 10 MG tablet Take 1 tablet (10 mg total) by mouth 3 (three) times daily with meals. 90 tablet 2  . prochlorperazine (COMPAZINE) 10 MG tablet Take 1 tablet (10 mg total) by mouth every 6 (six) hours as needed. 10 tablet 1  . sennosides-docusate sodium (SENOKOT-S) 8.6-50 MG tablet Take 1 tablet by mouth daily. As needed    . sodium chloride 1 g tablet Take 2 tablets (2 g total) by mouth 3 (three) times daily  with meals. 180 tablet 1  . warfarin (COUMADIN) 7.5 MG tablet TAKE 1 TABLET BY MOUTH DAILY AT 6PM. (Patient taking differently: Take 7.5 mg by mouth every evening. ) 30 tablet 0   No current facility-administered medications for this visit.    SURGICAL HISTORY:  Past Surgical History:  Procedure Laterality Date  . BRONCHIAL BRUSHINGS  12/28/2019   Procedure: BRONCHIAL BRUSHINGS;  Surgeon: Garner Nash, DO;  Location: [facility];  Service: Thoracic;;  . BRONCHIAL NEEDLE ASPIRATION BIOPSY  12/28/2019   Procedure: BRONCHIAL NEEDLE ASPIRATION BIOPSIES;  Surgeon: Garner Nash, DO;  Location: [facility];  Service: Thoracic;;  . BRONCHIAL WASHINGS  12/28/2019   Procedure: BRONCHIAL WASHINGS;  Surgeon: Garner Nash, DO;  Location: [facility];  Service: Thoracic;;  . CARDIAC CATHETERIZATION  05/2014  . CARDIAC DEFIBRILLATOR PLACEMENT  03/2011  . CHOLECYSTECTOMY  1994  . FOREARM FRACTURE SURGERY Left 1980  . FRACTURE SURGERY    . RIGHT HEART CATHETERIZATION N/A 05/30/2014  Procedure: RIGHT HEART CATH;  Surgeon: Jolaine Artist, MD;  Location: [facility] CATH LAB;  Service: Cardiovascular;  Laterality: N/A;  . RIGHT HEART CATHETERIZATION N/A 02/07/2015   Procedure: RIGHT HEART CATH;  Surgeon: Jolaine Artist, MD;  Location: [facility] CATH LAB;  Service: Cardiovascular;  Laterality: N/A;  . TONSILLECTOMY AND ADENOIDECTOMY  ~ 1998  . VIDEO BRONCHOSCOPY WITH ENDOBRONCHIAL ULTRASOUND N/A 12/28/2019   Procedure: VIDEO BRONCHOSCOPY WITH ENDOBRONCHIAL ULTRASOUND;  Surgeon: Garner Nash, DO;  Location: [facility];  Service: Thoracic;  Laterality: N/A;    REVIEW OF SYSTEMS:   Review of Systems  Constitutional: Negative for appetite change, chills, fatigue, fever and unexpected weight change.  HENT:   Negative for mouth sores, nosebleeds, sore throat and trouble swallowing.   Eyes: Negative for eye problems and icterus.  Respiratory: Negative for cough, hemoptysis, shortness of breath and  wheezing.   Cardiovascular: Negative for chest pain and leg swelling.  Gastrointestinal: Negative for abdominal pain, constipation, diarrhea, nausea and vomiting.  Genitourinary: Negative for bladder incontinence, difficulty urinating, dysuria, frequency and hematuria.   Musculoskeletal: Negative for back pain, gait problem, neck pain and neck stiffness.  Skin: Negative for itching and rash.  Neurological: Negative for dizziness, extremity weakness, gait problem, headaches, light-headedness and seizures.  Hematological: Negative for adenopathy. Does not bruise/bleed easily.  Psychiatric/Behavioral: Negative for confusion, depression and sleep disturbance. The patient is not nervous/anxious.     PHYSICAL EXAMINATION:  There were no vitals taken for this visit.  ECOG PERFORMANCE STATUS: {CHL ONC ECOG [id number]  Physical Exam  Constitutional: Oriented to person, place, and time and well-developed, well-nourished, and in no distress. No distress.  HENT:  Head: Normocephalic and atraumatic.  Mouth/Throat: Oropharynx is clear and moist. No oropharyngeal exudate.  Eyes: Conjunctivae are normal. Right eye exhibits no discharge. Left eye exhibits no discharge. No scleral icterus.  Neck: Normal range of motion. Neck supple.  Cardiovascular: Normal rate, regular rhythm, normal heart sounds and intact distal pulses.   Pulmonary/Chest: Effort normal and breath sounds normal. No respiratory distress. No wheezes. No rales.  Abdominal: Soft. Bowel sounds are normal. Exhibits no distension and no mass. There is no tenderness.  Musculoskeletal: Normal range of motion. Exhibits no edema.  Lymphadenopathy:    No cervical adenopathy.  Neurological: Alert and oriented to person, place, and time. Exhibits normal muscle tone. Gait normal. Coordination normal.  Skin: Skin is warm and dry. No rash noted. Not diaphoretic. No erythema. No pallor.  Psychiatric: Mood, memory and judgment normal.  Vitals  reviewed.  LABORATORY DATA: Lab Results  Component Value Date   WBC 15.6 (H) 07/23/2020   HGB 10.4 (L) 07/23/2020   HCT 33.4 (L) 07/23/2020   MCV 96.8 07/23/2020   PLT 285 07/23/2020      Chemistry      Component Value Date/Time   NA 136 07/23/2020 0513   K 4.5 07/23/2020 0513   CL 99 07/23/2020 0513   CO2 29 07/23/2020 0513   BUN 39 (H) 07/23/2020 0513   CREATININE 0.91 07/23/2020 0513   CREATININE 1.53 (H) 06/20/2020 1051      Component Value Date/Time   CALCIUM 9.5 07/23/2020 0513   ALKPHOS 83 07/08/2020 0542   AST 18 07/08/2020 0542   AST 10 (L) 06/20/2020 1051   ALT 16 07/08/2020 0542   ALT 8 06/20/2020 1051   BILITOT 0.7 07/08/2020 0542   BILITOT 0.3 06/20/2020 1051       RADIOGRAPHIC STUDIES:  MR BRAIN W WO  CONTRAST  Result Date: 08/24/2020 CLINICAL DATA:  Brain/CNS neoplasm.  Assess treatment response. EXAM: MRI HEAD WITHOUT AND WITH CONTRAST TECHNIQUE: Multiplanar, multiecho pulse sequences of the brain and surrounding structures were obtained without and with intravenous contrast. CONTRAST:  6.95m GADAVIST GADOBUTROL 1 MMOL/ML IV SOLN COMPARISON:  MRI of the brain July 20, 2020. FINDINGS: Brain: No acute infarction, hemorrhage, hydrocephalus or extra-axial collection. Significant interval increase in size of the right superior frontal gyrus enhancing lesion (series 20, image 88), now measuring 6 mm (1-2 mm on prior). There is mild interval decrease in thickness of the area of contrast enhancement involving the ventral aspect of the pons extending inferiorly to the pontomedullary junction measuring up to 6 mm in thickness (9 mm on prior). No other area of abnormal contrast enhancement. Unchanged right MCA territory infarct. Mild parenchymal volume loss. Vascular: Normal flow voids. Skull and upper cervical spine: Normal marrow signal. Sinuses/Orbits: Negative. Other: None. IMPRESSION: 1. Significant interval increase in size of the right superior frontal gyrus  enhancing lesion, now measuring 6 mm (1-2 mm on prior). 2. Mild interval decrease in thickness of the area of contrast enhancement involving the ventral aspect of the pons extending inferiorly to the pontomedullary junction measuring up to 6 mm in thickness (9 mm on prior). 3. Unchanged right MCA territory infarct. Electronically Signed   By: KPedro EarlsM.D.   On: 08/24/2020 17:54   CUP PACEART REMOTE DEVICE CHECK  Result Date: 09/05/2020 Scheduled remote reviewed. Normal device function. ERI reached on 08/17/2020. 169 AMS events; longest 4 hours 28 minutes; available EGMs suggest RA artifact - known issue Routing to triage due to ERI status. Next remote 30 days.    ASSESSMENT/PLAN:  This is a very pleasant 59year old African-American male recently diagnosed with extensive stage small cell lung cancer. He presented with a spiculated right middle lobe pulmonary nodule and mildly enlargedsolitary aortopulmonary lymph node. 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 He was diagnosed in March 2021.  He completed whole brain radiation under the care of Dr. MLisbeth Renshawon 01/17/2020.  He is currently undergoing palliative systemic chemotherapy with carboplatin for an 5on day 1, etoposide 100 mg/m2 on days 1, 2, and 3, and imfinzi 1500 mg IV every 3 weeks with neulasta support. He is status post 7 cycles. Starting from cycle #5, the patient has been on single agent immunotherapy with Imfinzi every 4 weeks. His treatment has been on hold for 2 months due to a hospitalization for gait instability and weakness and possible progressive metastatic disease to the brain. He is followed by neuro-oncology.  The patinet was supposed to have a restaging CT scan performed but it is not scheduled until 09/17/20.  The patient was seen with Dr. MJulien Nordmann Labs were reviewed. Recommend?  F/U?  He will continue to follow with neuro-oncology regarding his  history of metastatic disease to the brain.   The patient was advised to call immediately if he has any concerning symptoms in the interval. The patient voices understanding of current disease status and treatment options and is in agreement with the current care plan. All questions were answered. The patient knows to call the clinic with any problems, questions or concerns. We can certainly see the patient much sooner if necessary        No orders of the defined types were placed in this encounter.    Ailine Hefferan L Afnan Cadiente, PA-C 09/08/20

## 2020-09-10 NOTE — Addendum Note (Signed)
Addended by: Cheri Kearns A on: 09/10/2020 10:51 AM   Modules accepted: Level of Service

## 2020-09-10 NOTE — Progress Notes (Signed)
Remote ICD transmission.   

## 2020-09-11 ENCOUNTER — Inpatient Hospital Stay: Payer: Medicare Other

## 2020-09-11 ENCOUNTER — Inpatient Hospital Stay: Payer: Medicare Other | Admitting: Physician Assistant

## 2020-09-12 ENCOUNTER — Telehealth (HOSPITAL_COMMUNITY): Payer: Self-pay | Admitting: Cardiology

## 2020-09-12 NOTE — Progress Notes (Signed)
[facility] OFFICE PROGRESS NOTE  [name], MD [place] [address]  DIAGNOSIS: Extensive stage small cell lung cancer. He presented with a spiculated right middle lobe pulmonary nodule and mildly enlargedsolitary aortopulmonary lymph node. 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 He was diagnosed in March 2021.  PRIOR THERAPY: 1) Whole brain radiation under the care of Dr. Lisbeth Renshaw. Completed 01/17/2020  CURRENT THERAPY: Systemic chemotherapy withcarboplatin for an AUC of 5 on day 1,etoposide 100 mg/m on days 1, 2, and 3, and Imfinzi 1500 mg on day 1 IV every 3 weeks. First dose expected on4/20/21. Status post 7  cycles.  Starting from cycle #5 the patient will be treated with maintenance Imfinzi 1500 mg IV every 4 weeks.  INTERVAL HISTORY: Dennis [name]. 59 y.o. male returns to the clinic today for a follow-up visit.  The patient was last seen in the clinic on 08/30/2020.  The patient has been off treatment for several months due to being admitted to the hospital for instability in his gait and weakness.  The patient had imaging of his head with a CT scan on 07/10/2020 which showed anterior pontine lesion, consistent with possible metastasis.  He had an MRI of the brain on July 20, 2020 which showed region of abnormality.Marland Kitchen  He is currently following with neuro oncologist, Dr. Mickeal Skinner for his neurologic symptoms and abnormal findings on CNS imaging. who recommends he take decadron. The patient is currently on 2 mg of decadron daily. His gait instability is better at this time.   Due to being off treatment for several months, Dr. Julien Nordmann recommended a restaging CT scan of the chest, abdomen, and pelvis prior to resuming his treatment.  The patient had a restaging CT scan performed today.  The patient is feeling "pretty good" today. He denies any fever, chills, or weight loss. He reports night  sweats about 1x per week.  He denies any chest pain or hemoptysis. He states he has had a mild cough for the last 3 weeks or so. He reports mild shortness of breath "every now and then". He denies any nausea, vomiting, or diarrhea. He has mild constipation which is controlled with a stool softener.  He denies any headache or visual changes.  The patient is here today for evaluation and consideration of resuming his treatment with cycle #8 of immunotherapy.  MEDICAL HISTORY: Past Medical History:  Diagnosis Date  . Alcohol abuse with alcohol-induced mood disorder ([place]) 08/18/2015  . Automatic implantable cardioverter-defibrillator in situ 2012   S/P St Jude Dual Chamber. ICD.  Marland Kitchen CAD (coronary artery disease) 11/16/2014   CAD Coronary angiography (12/2008) with mid LAD totally occluded and collateralized.  . Cardiac LV ejection fraction 10-20%   . Chest pain 09/04/2018  . CHF (congestive heart failure) ([place])   . Chronic systolic heart failure (HCC)    a) Mixed ICM/NICM b) RHC (05/2014): RA 2, RV 19/2/3, PA 22/14 (18), PCWP 6, Fick CO/CI: 5.2 / 2.7, PVR 2.3 WU, PA 60% and 64% c) ECHO (05/2014): EF 20-25%, diff HK, akinesis entireanteroseptal myocardium, triv AI, mod MR, LA mod/sev dilated  . Cocaine abuse ([place]) 01/24/2015  . Cocaine abuse with cocaine-induced mood disorder ([place]) 08/20/2015  . Drug abuse and dependence ([place])   . Dysphagia following cerebral infarction 01/24/2015  . Embolic stroke involving right middle cerebral artery ([place])   . Extensive stage primary small cell carcinoma of  lung ([place]) 01/04/2020  . H/O noncompliance with medical treatment, presenting hazards to health 01/24/2015  . Heart murmur   . High cholesterol   . Ischemic cardiomyopathy    a) Coronary angiography (12/2008) at [facility]: [place]: nl, LAD mid 100% stenosis with left to left and right-to-left collaterals to the distal LAD; Lcx: nl, RCA nl.    . 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[place])   . MDD (major depressive disorder),  recurrent severe, without psychosis ([place]) 08/18/2015  . Open wound of foot 02/27/2015  . Pneumonia 1988  . Psychoactive substance-induced mood disorder ([place]) 07/17/2015  . Sleep apnea    "cleared after T&A"  . Status post PICC central line placement   . Stroke ([place])   . Substance induced mood disorder ([place]) 08/17/2015  . Type II diabetes mellitus (HCC)     ALLERGIES:  has No Known Allergies.  MEDICATIONS:  Current Outpatient Medications  Medication Sig Dispense Refill  . Accu-Chek Softclix Lancets lancets     . atorvastatin (LIPITOR) 20 MG tablet TAKE 1 TABLET BY MOUTH DAILY AT 6 PM. (Patient taking differently: Take 20 mg by mouth daily at 6 PM. ) 30 tablet 6  . Blood Glucose Monitoring Suppl (FIFTY50 GLUCOSE METER 2.0) w/Device KIT Use as instructed    . dexamethasone (DECADRON) 2 MG tablet Take 1 tablet (2 mg total) by mouth daily. 60 tablet 0  . feeding supplement (ENSURE ENLIVE / ENSURE PLUS) LIQD Take 237 mLs by mouth 2 (two) times daily between meals. 237 mL 0  . insulin glargine (LANTUS SOLOSTAR) 100 UNIT/ML Solostar Pen Inject 25 Units into the skin 2 (two) times daily. 15 mL 2  . insulin lispro (HUMALOG KWIKPEN) 100 UNIT/ML KwikPen Inject 6 Units into the skin 3 (three) times daily. 15 mL 1  . INSUPEN ULTRAFIN 31G X 8 MM MISC     . midodrine (PROAMATINE) 10 MG tablet Take 1 tablet (10 mg total) by mouth 3 (three) times daily with meals. 90 tablet 2  . sennosides-docusate sodium (SENOKOT-S) 8.6-50 MG tablet Take 1 tablet by mouth daily. As needed    . sodium chloride 1 g tablet Take 2 tablets (2 g total) by mouth 3 (three) times daily with meals. 180 tablet 1  . warfarin (COUMADIN) 7.5 MG tablet TAKE 1 TABLET BY MOUTH DAILY AT 6PM. (Patient taking differently: Take 7.5 mg by mouth every evening. ) 30 tablet 0   No current facility-administered medications for this visit.    SURGICAL HISTORY:  Past Surgical History:  Procedure Laterality Date  . BRONCHIAL BRUSHINGS  12/28/2019    Procedure: BRONCHIAL BRUSHINGS;  Surgeon: Garner Nash, DO;  Location: [facility];  Service: Thoracic;;  . BRONCHIAL NEEDLE ASPIRATION BIOPSY  12/28/2019   Procedure: BRONCHIAL NEEDLE ASPIRATION BIOPSIES;  Surgeon: Garner Nash, DO;  Location: [facility];  Service: Thoracic;;  . BRONCHIAL WASHINGS  12/28/2019   Procedure: BRONCHIAL WASHINGS;  Surgeon: Garner Nash, DO;  Location: [facility];  Service: Thoracic;;  . CARDIAC CATHETERIZATION  05/2014  . CARDIAC DEFIBRILLATOR PLACEMENT  03/2011  . CHOLECYSTECTOMY  1994  . FOREARM FRACTURE SURGERY Left 1980  . FRACTURE SURGERY    . RIGHT HEART CATHETERIZATION N/A 05/30/2014   Procedure: RIGHT HEART CATH;  Surgeon: Jolaine Artist, MD;  Location: [facility];  Service: Cardiovascular;  Laterality: N/A;  . RIGHT HEART CATHETERIZATION N/A 02/07/2015   Procedure: RIGHT HEART CATH;  Surgeon: Jolaine Artist, MD;  Location: [facility] LAB;  Service: Cardiovascular;  Laterality: N/A;  . TONSILLECTOMY AND ADENOIDECTOMY  ~ 1998  . VIDEO BRONCHOSCOPY WITH ENDOBRONCHIAL ULTRASOUND N/A 12/28/2019   Procedure: VIDEO BRONCHOSCOPY WITH ENDOBRONCHIAL ULTRASOUND;  Surgeon: Garner Nash, DO;  Location: [facility];  Service: Thoracic;  Laterality: N/A;    REVIEW OF SYSTEMS:   Review of Systems  Constitutional: Negative for appetite change, chills, fatigue, fever and unexpected weight change.  HENT: Negative for mouth sores, nosebleeds, sore throat and trouble swallowing.   Eyes: Negative for eye problems and icterus.  Respiratory: Positive for cough. Positive for mild shortness of breath intermittently. Negative for hemoptysis and wheezing.   Cardiovascular: Negative for chest pain and leg swelling.  Gastrointestinal: Positive for occasional constipation. Negative for abdominal pain, diarrhea, nausea and vomiting.  Genitourinary: Negative for bladder incontinence, difficulty urinating, dysuria, frequency and hematuria.   Musculoskeletal:  Negative for back pain, gait problem, neck pain and neck stiffness.  Skin: Negative for itching and rash.  Neurological: Negative for dizziness, extremity weakness, gait problem, headaches, light-headedness and seizures.  Hematological: Negative for adenopathy. Does not bruise/bleed easily.  Psychiatric/Behavioral: Negative for confusion, depression and sleep disturbance. The patient is not nervous/anxious.     PHYSICAL EXAMINATION:  Blood pressure (!) 123/96, pulse 98, temperature 97.7 F (36.5 C), temperature source Tympanic, resp. rate 18, height 6' (1.829 m), weight 171 lb (77.6 kg), SpO2 98 %.  ECOG PERFORMANCE STATUS: 1 - Symptomatic but completely ambulatory  Physical Exam  Constitutional: Oriented to person, place, and time and well-developed, well-nourished, and in no distress.  HENT:  Head: Normocephalic and atraumatic.  Mouth/Throat: Oropharynx is clear and moist. No oropharyngeal exudate.  Eyes: Conjunctivae are normal. Right eye exhibits no discharge. Left eye exhibits no discharge. No scleral icterus.  Neck: Normal range of motion. Neck supple.  Cardiovascular: Normal rate, regular rhythm, normal heart sounds and intact distal pulses.   Pulmonary/Chest: Effort normal and breath sounds normal. No respiratory distress. No wheezes. No rales.  Abdominal: Soft. Bowel sounds are normal. Exhibits no distension and no mass. There is no tenderness.  Musculoskeletal: Normal range of motion. Exhibits no edema.  Lymphadenopathy:    No cervical adenopathy.  Neurological: Alert and oriented to person, place, and time. Exhibits normal muscle tone. Gait normal. Coordination normal.  Skin: Skin is warm and dry. No rash noted. Not diaphoretic. No erythema. No pallor.  Psychiatric: Mood, memory and judgment normal.  Vitals reviewed.  LABORATORY DATA: Lab Results  Component Value Date   WBC 14.0 (H) 09/17/2020   HGB 12.9 (L) 09/17/2020   HCT 39.7 09/17/2020   MCV 95.2 09/17/2020   PLT  226 09/17/2020      Chemistry      Component Value Date/Time   NA 141 09/17/2020 1232   K 4.2 09/17/2020 1232   CL 107 09/17/2020 1232   CO2 28 09/17/2020 1232   BUN 29 (H) 09/17/2020 1232   CREATININE 1.40 (H) 09/17/2020 1232      Component Value Date/Time   CALCIUM 9.9 09/17/2020 1232   ALKPHOS 121 09/17/2020 1232   AST 20 09/17/2020 1232   ALT 39 09/17/2020 1232   BILITOT 0.3 09/17/2020 1232       RADIOGRAPHIC STUDIES:  MR BRAIN W WO CONTRAST  Result Date: 08/24/2020 CLINICAL DATA:  Brain/CNS neoplasm.  Assess treatment response. EXAM: MRI HEAD WITHOUT AND WITH CONTRAST TECHNIQUE: Multiplanar, multiecho pulse sequences of the brain and surrounding structures were obtained without and with intravenous contrast. CONTRAST:  6.94m GADAVIST GADOBUTROL 1 MMOL/ML IV  SOLN COMPARISON:  MRI of the brain July 20, 2020. FINDINGS: Brain: No acute infarction, hemorrhage, hydrocephalus or extra-axial collection. Significant interval increase in size of the right superior frontal gyrus enhancing lesion (series 20, image 88), now measuring 6 mm (1-2 mm on prior). There is mild interval decrease in thickness of the area of contrast enhancement involving the ventral aspect of the pons extending inferiorly to the pontomedullary junction measuring up to 6 mm in thickness (9 mm on prior). No other area of abnormal contrast enhancement. Unchanged right MCA territory infarct. Mild parenchymal volume loss. Vascular: Normal flow voids. Skull and upper cervical spine: Normal marrow signal. Sinuses/Orbits: Negative. Other: None. IMPRESSION: 1. Significant interval increase in size of the right superior frontal gyrus enhancing lesion, now measuring 6 mm (1-2 mm on prior). 2. Mild interval decrease in thickness of the area of contrast enhancement involving the ventral aspect of the pons extending inferiorly to the pontomedullary junction measuring up to 6 mm in thickness (9 mm on prior). 3. Unchanged right MCA  territory infarct. Electronically Signed   By: Pedro Earls M.D.   On: 08/24/2020 17:54   CUP PACEART REMOTE DEVICE CHECK  Result Date: 09/05/2020 Scheduled remote reviewed. Normal device function. ERI reached on 08/17/2020. 169 AMS events; longest 4 hours 28 minutes; available EGMs suggest RA artifact - known issue Routing to triage due to ERI status. Next remote 30 days.    ASSESSMENT/PLAN:  This is a very pleasant 59 year old African-American male diagnosed with extensive stage small cell lung cancer. He presented with spiculated right middle lobe pulmonary nodule in addition to mediastinal lymphadenopathy and multiple metastatic brain lesions.  The patient is status post whole brain irradiation.  He is currently undergoing systemic chemotherapy with carboplatin, etoposide and Imfinzi status post 7 cycles.  Starting from cycle #5 the patient is on maintenance treatment with immunotherapy with Imfinzi every 4 weeks.  The patient has been off treatment for more than 2 months now because of the suspicious metastatic disease in the brain.  He is currently on a tapered dose of Decadron 2 mg p.o. daily.  The patient recently had a restaging CT scan of the chest, abdomen, and pelvis performed to restage his disease.  Dr. Julien Nordmann personally and independently reviewed the scan discussed the results the patient.  The radiology final report is not available at this time.  However, [name] did not feel that there was a significant difference when compared to his previous CT scan of the chest abdomen and pelvis performed a few months ago.  [name] recommends that the patient proceed with cycle #8 of maintenance immunotherapy with Imfinzi today as scheduled.  We will see the patient back for follow-up visit in 4 weeks for evaluation before starting cycle #9.  I will personally call the patient with the results of the scan and will mail a copy of the scan to the patient's  house.  His creatinine was elevated compared to baseline today. The patient was encouraged to increase his oral hydration.   The patient was advised to call immediately if he has any concerning symptoms in the interval. The patient voices understanding of current disease status and treatment options and is in agreement with the current care plan. All questions were answered. The patient knows to call the clinic with any problems, questions or concerns. We can certainly see the patient much sooner if necessary   No orders of the defined types were placed in this  encounter.    Dennis Waymire L Brandan Glauber, PA-C 09/17/20  ADDENDUM: Hematology/Oncology Attending: I had a face-to-face encounter with the patient today.  I recommended his care plan.  This is a very pleasant 59 years old African-American male with extensive stage small cell lung cancer with brain metastasis status post whole brain irradiation.  The patient started systemic chemotherapy with carboplatin, etoposide and Imfinzi for 4 cycles and he is currently on maintenance treatment with Imfinzi every 4 weeks status post 3 more cycles.  He has been tolerating this treatment well with no concerning adverse effects.  His most recent MRI of the brain showed a suspicious lesion in the pons with unclear etiology concerning for metastasis or inflammatory process. The patient is feeling much better and has been tolerating his treatment with immunotherapy fairly well.  He had repeat CT scan of the chest, abdomen pelvis performed earlier today but the report is still pending.  I personally and independently reviewed the scan and I did not see any significant difference between the current scan and the previous one but definitely we will wait for the final report before making any future recommendation. I recommended for the patient to continue his current treatment with immunotherapy for now.  If the scan showed any concerning findings for  progression, we may consider changing his treatment in the future. The patient was advised to call immediately if he has any other concerning symptoms in the interval.  Disclaimer: This note was dictated with voice recognition software. Similar sounding words can inadvertently be transcribed and may be missed upon review. Eilleen Kempf, MD 09/17/20

## 2020-09-12 NOTE — Telephone Encounter (Signed)
Appointment  Reminder call made Pt aware of appt details

## 2020-09-12 NOTE — Progress Notes (Signed)
Patient ID: Dennis Hemmer., male   DOB: 1960/12/11, 59 y.o.   MRN: [id number]      Advanced Heart Failure Clinic Note  HF MD: Dr Haroldine Laws PCP: [facility] EP: Dr Lovena Le   HPI: Dennis [name]. is a 59 y.o. male  with h/o DM2, polysubstance use (tobacco, cocaine) last sused 2017, mixed ICM/NICM and chronic systolic HF due to ventricular noncompaction- on chronic coumadin, Small cell lung cancer with brain mets,  and noncompliance .  Admitted to [facility] in 4/16 with acute CVA and with L sided weakness. CT showed R MCA stroke. Did not receive TPA due to delay in arrival. Had ECHO that showed EF 10-15% mild RV HK. No obvious LV clot. As he improved he was transferred to CIR but then developed hypotension and transferred back to HF ward. Underwent RHC on 4/27. Filling pressures mildly elevated CO ok. Diuresed gently. Meds limited by SBP in 80s. Could only tolerate ivabradine 7.5 bid, digoxin and spiro 12.5. Lasix and carvedilol stopped.   Admitted in November 2020 with CP. Troponin negative, scheduled for stress test. Had Myoview 09/21/18 which showed no ischemia, but prior infarct, noted high risk due to low EF.   Since the last visit he has been diagnosed with March 2021 with lung cancer  + brain metastasis. Received radiation and is continuing on chemo.    Admitted in October 2021 with syncope and  falls. BP was low so HF meds stopped and he was started on midodrine.  MRI of brain concerning for metastatic disease. Discharged on midodrine 10 mg three times a day + abdominal binder/ compression stockings.   Today he returns for HF follow up. He has not been seen since 2020. As noted above all HF meds stopped due to syncope/hypotension. Overall feeling ok. Not moving around much at home due to fatigue. Mild SOB with exertion. Denies PND/Orthopnea. No cocaine since October. No longer smoking.  Appetite ok. No fever or chills. Weight at home has been trending up. Taking all medications.   Echo 2016  EF 15%.  Echo (9/17): EF 25-30%, moderate LV dilation with prominent trabeculation, normal RV size and systolic function.  Echo 08/12/17 EF 45-50%, much improved from previous  Per Dr Vaughan Browner Echo 10/2018 LVEF 20-25%, Grade 2 DD. Echo  10/ 2021 EF  25-30% RV normal.   RHC 02/07/15 RA = 3 RV = 50/4/7 PA = 55/14 (34) PCW = 22 Fick cardiac output/index = 4.6/2.4 Thermodilution CO/CI = 4.8/2.5 PVR = 2.5 WU SVR = 1443 FA sat = 96% PA sat = 51%, 56%, 61%  Noninvasive Ao pressure = 106/74 (86)   CPX 02/07/2016 Peak VO2: 18.7 (54% predicted peak VO2) VE/VCO2 slope: 30 OUES: 1.62 Peak RER:  1.13 Ventilatory Threshold: 10.9 (32% predicted or measured peak VO2)  CPX 12/24/16 Peak VO2 19.6 (58% predicted peak VO2) VE/VCO2 slope: 31 OUES: 1.79 Peak RER 1.09 Ventilatory threshold: 14.9 (44% predicted or measure peak VO2)   CPX 3/20 FVC 3.33 (76%)    FEV1 2.54 (75%)     FEV1/FVC 76 (97%)     MVV 116 (76%)   Resting HR: 94 Peak HR: 122  (74% age predicted max HR)  BP rest: 94/64 BP peak: 114/62  Peak VO2: 19.9 (60% predicted peak VO2)  VE/VCO2 slope: 31  OUES: 2.07  Peak RER: 1.09  Ventilatory Threshold: 15.3 (46% predicted or 77% measured peak VO2)  VE/MVV: 50%  O2pulse: 12  (80% predicted O2pulse)   Review of systems  complete and found to be negative unless listed in HPI.    Past Medical History:  Diagnosis Date  . Alcohol abuse with alcohol-induced mood disorder ([place]) 08/18/2015  . Automatic implantable cardioverter-defibrillator in situ 2012   S/P St Jude Dual Chamber. ICD.  Marland Kitchen CAD (coronary artery disease) 11/16/2014   CAD Coronary angiography (12/2008) with mid LAD totally occluded and collateralized.  . Cardiac LV ejection fraction 10-20%   . Chest pain 09/04/2018  . CHF (congestive heart failure) ([place])   . Chronic systolic heart failure (HCC)    a) Mixed ICM/NICM b) RHC (05/2014): RA 2, RV 19/2/3, PA 22/14 (18), PCWP 6, Fick CO/CI: 5.2 / 2.7, PVR 2.3  WU, PA 60% and 64% c) ECHO (05/2014): EF 20-25%, diff HK, akinesis entireanteroseptal myocardium, triv AI, mod MR, LA mod/sev dilated  . Cocaine abuse ([place]) 01/24/2015  . Cocaine abuse with cocaine-induced mood disorder ([place]) 08/20/2015  . Drug abuse and dependence ([place])   . Dysphagia following cerebral infarction 01/24/2015  . Embolic stroke involving right middle cerebral artery ([place])   . Extensive stage primary small cell carcinoma of lung ([place]) 01/04/2020  . H/O noncompliance with medical treatment, presenting hazards to health 01/24/2015  . Heart murmur   . High cholesterol   . Ischemic cardiomyopathy    a) Coronary angiography (12/2008) at [facility]: Lmain: nl, LAD mid 100% stenosis with left to left and right-to-left collaterals to the distal LAD; Lcx: nl, RCA nl.    . 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[place])   . MDD (major depressive disorder), recurrent severe, without psychosis ([place]) 08/18/2015  . Open wound of foot 02/27/2015  . Pneumonia 1988  . Psychoactive substance-induced mood disorder ([place]) 07/17/2015  . Sleep apnea    "cleared after T&A"  . Status post PICC central line placement   . Stroke ([place])   . Substance induced mood disorder ([place]) 08/17/2015  . Type II diabetes mellitus ([place])      Current Outpatient Medications on File Prior to Encounter  Medication Sig Dispense Refill  . Accu-Chek Softclix Lancets lancets     . atorvastatin (LIPITOR) 20 MG tablet TAKE 1 TABLET BY MOUTH DAILY AT 6 PM. (Patient taking differently: Take 20 mg by mouth daily at 6 PM. ) 30 tablet 6  . Blood Glucose Monitoring Suppl (FIFTY50 GLUCOSE METER 2.0) w/Device KIT Use as instructed    . dexamethasone (DECADRON) 2 MG tablet Take 1 tablet (2 mg total) by mouth daily. 60 tablet 0  . feeding supplement (ENSURE ENLIVE / ENSURE PLUS) LIQD Take 237 mLs by mouth 2 (two) times daily between meals. 237 mL 0  . insulin glargine (LANTUS SOLOSTAR) 100 UNIT/ML Solostar Pen Inject 25 Units into the skin 2 (two) times  daily. 15 mL 2  . insulin lispro (HUMALOG KWIKPEN) 100 UNIT/ML KwikPen Inject 6 Units into the skin 3 (three) times daily. 15 mL 1  . INSUPEN ULTRAFIN 31G X 8 MM MISC     . midodrine (PROAMATINE) 10 MG tablet Take 1 tablet (10 mg total) by mouth 3 (three) times daily with meals. 90 tablet 2  . sennosides-docusate sodium (SENOKOT-S) 8.6-50 MG tablet Take 1 tablet by mouth daily. As needed    . sodium chloride 1 g tablet Take 2 tablets (2 g total) by mouth 3 (three) times daily with meals. 180 tablet 1  . warfarin (COUMADIN) 7.5 MG tablet TAKE 1 TABLET BY MOUTH DAILY AT 6PM. (Patient taking differently: Take 7.5 mg by mouth every evening. ) 30  tablet 0   No current facility-administered medications on file prior to encounter.    No Known Allergies  Social History   Socioeconomic History  . Marital status: Single    Spouse name: Not on file  . Number of children: 1  . Years of education: 75  . Highest education level: Not on file  Occupational History  . Occupation: Disabled  Tobacco Use  . Smoking status: Former Smoker    Packs/day: 0.25    Years: 31.00    Pack years: 7.75    Types: Cigarettes  . Smokeless tobacco: Never Used  Vaping Use  . Vaping Use: Never used  Substance and Sexual Activity  . Alcohol use: Not Currently    Alcohol/week: 0.0 standard drinks    Comment: 09/21/2014 "might have a drink q 6 /months, if that"  . Drug use: Yes    Types: Cocaine    Comment: last use 07/06/2020  . Sexual activity: Yes  Other Topics Concern  . Not on file  Social History Narrative   Lives in [place] with his mother. No pets. Fun: movies, sports, daughter.    Denies religious beliefs that would effect health care.    Social Determinants of Health   Financial Resource Strain:   . Difficulty of Paying Living Expenses: Not on file  Food Insecurity:   . Worried About Charity fundraiser in the Last Year: Not on file  . Ran Out of Food in the Last Year: Not on file   Transportation Needs:   . Lack of Transportation (Medical): Not on file  . Lack of Transportation (Non-Medical): Not on file  Physical Activity:   . Days of Exercise per Week: Not on file  . Minutes of Exercise per Session: Not on file  Stress:   . Feeling of Stress : Not on file  Social Connections:   . Frequency of Communication with Friends and Family: Not on file  . Frequency of Social Gatherings with Friends and Family: Not on file  . Attends Religious Services: Not on file  . Active Member of Clubs or Organizations: Not on file  . Attends Archivist Meetings: Not on file  . Marital Status: Not on file  Intimate Partner Violence:   . Fear of Current or Ex-Partner: Not on file  . Emotionally Abused: Not on file  . Physically Abused: Not on file  . Sexually Abused: Not on file            Family History  Problem Relation Age of Onset  . Diabetes Mother   . Heart disease Mother   . Diabetes Father   . Prostate cancer Father   . Heart disease Father   . Diabetes Brother   . Diabetes Brother     Vitals:   09/13/20 0931  BP: 102/90  Pulse: 99  SpO2: 100%  Weight: 77.6 kg     Wt Readings from Last 3 Encounters:  09/13/20 77.6 kg  08/30/20 74.7 kg  08/27/20 74.3 kg     PHYSICAL EXAM: General:  Walked slowly in the clinic. No resp difficulty HEENT: normal Neck: supple. no JVD. Carotids 2+ bilat; no bruits. No lymphadenopathy or thryomegaly appreciated. Cor: PMI nondisplaced. Regular rate & rhythm. No rubs, gallops or murmurs. Lungs: clear Abdomen: wearing abdominal binder, soft, nontender, nondistended. No hepatosplenomegaly. No bruits or masses. Good bowel sounds. Extremities: no cyanosis, clubbing, rash, edema Neuro: alert & orientedx3, cranial nerves grossly intact. moves all 4 extremities w/o difficulty.  Affect pleasant  ASSESSMENT & PLAN: 1. Chronic Systolic Heart Failure-Mixed NICM/ICM .  - S/P St Jude Dual Chamber. ICD. - Echo 09/05/2018 LVEF  20-25%.  - Echo 11/02/2018: EF remains low at 20-25%  - Echo 07/2020 EF 25-30%  - CPX 12/2018. Moderate HF limitation.  - [place] - impedance up  - NYHA III. Volume status stable despite weight gain.  - Off HF meds due to syncope/orthostasis.  -Continue midodrine 10 mg three times a day.BP remains low so will not add back HF meds at this time.  2. Hx of embolic CVA - INR managed per coumadin clinic.  - No bleeding issues.  3. CAD Coronary angiography (12/2008) with mid LAD totally occluded and collateralized. - Stress test 09/21/18 with no ischemia and prior infarct. HF meds as above.  - No chest pain.  - Continue atorvastatin 20 mg daily.  - No ASA with stable CAD and use of coumadin  for LV  noncompaction.  4. DMII - Per PCP - Off jardiance  5. Polysubstance abuse  - Sober since 09/17/2015. Attends NA regularly and lives with his sponsor.  6. Tobacco Abuse - Smoking 1/2 ppd. Encouraged complete cessation.  7. Lung Cancer with Brain Mets -Received radiation and starting back on chemo next week.  8. Syncope Now off all HF meds not related to arrhythmias.  Continue midodrine 10 mg three times a day.   Follow up in 6 weeks.    Darrick Grinder, NP  09/13/20

## 2020-09-13 ENCOUNTER — Telehealth: Payer: Self-pay | Admitting: Physician Assistant

## 2020-09-13 ENCOUNTER — Other Ambulatory Visit: Payer: Self-pay

## 2020-09-13 ENCOUNTER — Encounter (HOSPITAL_COMMUNITY): Payer: Self-pay

## 2020-09-13 ENCOUNTER — Ambulatory Visit (HOSPITAL_COMMUNITY)
Admission: RE | Admit: 2020-09-13 | Discharge: 2020-09-13 | Disposition: A | Discharge status: Home / Self Care | Payer: Medicare Other | Source: Ambulatory Visit | Attending: Adult Health | Admitting: Adult Health

## 2020-09-13 VITALS — BP 102/90 | HR 99 | Wt 171.0 lb

## 2020-09-13 DIAGNOSIS — Z79899 Other long term (current) drug therapy: Secondary | ICD-10-CM | POA: Diagnosis not present

## 2020-09-13 DIAGNOSIS — Z8249 Family history of ischemic heart disease and other diseases of the circulatory system: Secondary | ICD-10-CM | POA: Insufficient documentation

## 2020-09-13 DIAGNOSIS — Z87891 Personal history of nicotine dependence: Secondary | ICD-10-CM | POA: Diagnosis not present

## 2020-09-13 DIAGNOSIS — R0602 Shortness of breath: Secondary | ICD-10-CM | POA: Diagnosis present

## 2020-09-13 DIAGNOSIS — Z7901 Long term (current) use of anticoagulants: Secondary | ICD-10-CM

## 2020-09-13 DIAGNOSIS — I2510 Atherosclerotic heart disease of native coronary artery without angina pectoris: Secondary | ICD-10-CM | POA: Insufficient documentation

## 2020-09-13 DIAGNOSIS — C7931 Secondary malignant neoplasm of brain: Secondary | ICD-10-CM | POA: Diagnosis not present

## 2020-09-13 DIAGNOSIS — I5022 Chronic systolic (congestive) heart failure: Secondary | ICD-10-CM | POA: Diagnosis not present

## 2020-09-13 DIAGNOSIS — E119 Type 2 diabetes mellitus without complications: Secondary | ICD-10-CM | POA: Insufficient documentation

## 2020-09-13 DIAGNOSIS — F1910 Other psychoactive substance abuse, uncomplicated: Secondary | ICD-10-CM | POA: Insufficient documentation

## 2020-09-13 DIAGNOSIS — I255 Ischemic cardiomyopathy: Secondary | ICD-10-CM | POA: Diagnosis not present

## 2020-09-13 DIAGNOSIS — C3490 Malignant neoplasm of unspecified part of unspecified bronchus or lung: Secondary | ICD-10-CM | POA: Diagnosis not present

## 2020-09-13 DIAGNOSIS — I428 Other cardiomyopathies: Secondary | ICD-10-CM | POA: Diagnosis not present

## 2020-09-13 DIAGNOSIS — Z8673 Personal history of transient ischemic attack (TIA), and cerebral infarction without residual deficits: Secondary | ICD-10-CM | POA: Diagnosis not present

## 2020-09-13 DIAGNOSIS — R55 Syncope and collapse: Secondary | ICD-10-CM | POA: Diagnosis not present

## 2020-09-13 DIAGNOSIS — Z9119 Patient's noncompliance with other medical treatment and regimen: Secondary | ICD-10-CM | POA: Diagnosis not present

## 2020-09-13 DIAGNOSIS — Z794 Long term (current) use of insulin: Secondary | ICD-10-CM | POA: Diagnosis not present

## 2020-09-13 NOTE — Patient Instructions (Signed)
No labs done today.   No medication changes were made. Please continue all current medications as prescribed.  Your physician recommends that you schedule a follow-up appointment in: 6 weeks with Dr. Haroldine Laws   If you have any questions or concerns before your next appointment please send [place] a message through [facility] or call our office at [phone].    TO LEAVE A MESSAGE FOR THE NURSE SELECT OPTION 2, PLEASE LEAVE A MESSAGE INCLUDING: . YOUR NAME . DATE OF BIRTH . CALL BACK NUMBER . REASON FOR CALL**this is important as we prioritize the call backs  YOU WILL RECEIVE A CALL BACK THE SAME DAY AS LONG AS YOU CALL BEFORE 4:00 PM   Do the following things EVERYDAY: 1) Weigh yourself in the morning before breakfast. Write it down and keep it in a log. 2) Take your medicines as prescribed 3) Eat low salt foods--Limit salt (sodium) to 2000 mg per day.  4) Stay as active as you can everyday 5) Limit all fluids for the day to less than 2 liters   At the [facility], you and your health needs are our priority. As part of our continuing mission to provide you with exceptional heart care, we have created designated Provider Care Teams. These Care Teams include your primary Cardiologist (physician) and Advanced Practice Providers (APPs- Physician Assistants and Nurse Practitioners) who all work together to provide you with the care you need, when you need it.   You may see any of the following providers on your designated Care Team at your next follow up: Marland Kitchen Dr Glori Bickers . Dr Loralie Champagne . Darrick Grinder, NP . Lyda Jester, PA . Audry Riles, PharmD   Please be sure to bring in all your medications bottles to every appointment.

## 2020-09-13 NOTE — Telephone Encounter (Signed)
Scheduled apt per 1/29 sch msg - unable to reach pt . Called and left message with appt date and time on vmail.

## 2020-09-17 ENCOUNTER — Inpatient Hospital Stay: Payer: Medicare Other

## 2020-09-17 ENCOUNTER — Encounter: Payer: Self-pay | Admitting: Physician Assistant

## 2020-09-17 ENCOUNTER — Other Ambulatory Visit: Payer: Self-pay

## 2020-09-17 ENCOUNTER — Ambulatory Visit (HOSPITAL_COMMUNITY)
Admission: RE | Admit: 2020-09-17 | Discharge: 2020-09-17 | Disposition: A | Discharge status: Home / Self Care | Payer: Medicare Other | Source: Ambulatory Visit | Attending: Internal Medicine | Admitting: Internal Medicine

## 2020-09-17 ENCOUNTER — Inpatient Hospital Stay: Payer: Medicare Other | Attending: Radiation Oncology

## 2020-09-17 ENCOUNTER — Inpatient Hospital Stay (HOSPITAL_BASED_OUTPATIENT_CLINIC_OR_DEPARTMENT_OTHER): Payer: Medicare Other | Admitting: Physician Assistant

## 2020-09-17 VITALS — BP 123/96 | HR 98 | Temp 97.7°F | Resp 18 | Ht 72.0 in | Wt 171.0 lb

## 2020-09-17 DIAGNOSIS — C342 Malignant neoplasm of middle lobe, bronchus or lung: Secondary | ICD-10-CM | POA: Insufficient documentation

## 2020-09-17 DIAGNOSIS — Z5112 Encounter for antineoplastic immunotherapy: Secondary | ICD-10-CM

## 2020-09-17 DIAGNOSIS — C7931 Secondary malignant neoplasm of brain: Secondary | ICD-10-CM | POA: Diagnosis present

## 2020-09-17 DIAGNOSIS — Z79899 Other long term (current) drug therapy: Secondary | ICD-10-CM | POA: Insufficient documentation

## 2020-09-17 DIAGNOSIS — C3411 Malignant neoplasm of upper lobe, right bronchus or lung: Secondary | ICD-10-CM

## 2020-09-17 DIAGNOSIS — C3490 Malignant neoplasm of unspecified part of unspecified bronchus or lung: Secondary | ICD-10-CM

## 2020-09-17 LAB — CBC WITH DIFFERENTIAL (CANCER CENTER ONLY)
Abs Immature Granulocytes: 0.14 10*3/uL — ABNORMAL HIGH (ref 0.00–0.07)
Basophils Absolute: 0 10*3/uL (ref 0.0–0.1)
Basophils Relative: 0 %
Eosinophils Absolute: 0.1 10*3/uL (ref 0.0–0.5)
Eosinophils Relative: 1 %
HCT: 39.7 % (ref 39.0–52.0)
Hemoglobin: 12.9 g/dL — ABNORMAL LOW (ref 13.0–17.0)
Immature Granulocytes: 1 %
Lymphocytes Relative: 17 %
Lymphs Abs: 2.3 10*3/uL (ref 0.7–4.0)
MCH: 30.9 pg (ref 26.0–34.0)
MCHC: 32.5 g/dL (ref 30.0–36.0)
MCV: 95.2 fL (ref 80.0–100.0)
Monocytes Absolute: 1 10*3/uL (ref 0.1–1.0)
Monocytes Relative: 7 %
Neutro Abs: 10.4 10*3/uL — ABNORMAL HIGH (ref 1.7–7.7)
Neutrophils Relative %: 74 %
Platelet Count: 226 10*3/uL (ref 150–400)
RBC: 4.17 MIL/uL — ABNORMAL LOW (ref 4.22–5.81)
RDW: 15.3 % (ref 11.5–15.5)
WBC Count: 14 10*3/uL — ABNORMAL HIGH (ref 4.0–10.5)
nRBC: 0 % (ref 0.0–0.2)

## 2020-09-17 LAB — TSH: TSH: 1.152 u[IU]/mL (ref 0.320–4.118)

## 2020-09-17 LAB — CMP (CANCER CENTER ONLY)
ALT: 39 U/L (ref 0–44)
AST: 20 U/L (ref 15–41)
Albumin: 3.1 g/dL — ABNORMAL LOW (ref 3.5–5.0)
Alkaline Phosphatase: 121 U/L (ref 38–126)
Anion gap: 6 (ref 5–15)
BUN: 29 mg/dL — ABNORMAL HIGH (ref 6–20)
CO2: 28 mmol/L (ref 22–32)
Calcium: 9.9 mg/dL (ref 8.9–10.3)
Chloride: 107 mmol/L (ref 98–111)
Creatinine: 1.4 mg/dL — ABNORMAL HIGH (ref 0.61–1.24)
GFR, Estimated: 58 mL/min — ABNORMAL LOW (ref >60)
Glucose, Bld: 165 mg/dL — ABNORMAL HIGH (ref 70–99)
Potassium: 4.2 mmol/L (ref 3.5–5.1)
Sodium: 141 mmol/L (ref 135–145)
Total Bilirubin: 0.3 mg/dL (ref 0.3–1.2)
Total Protein: 7 g/dL (ref 6.5–8.1)

## 2020-09-17 IMAGING — CT CT CHEST W/ CM
2 of 5 series · 13 of 36 positions shown, 16 images · IV contrast (omnipaque)
Comparison: [DATE]

CLINICAL DATA: Extensive stage small-cell lung cancer. Known brain
metastasis.

EXAM:
CT CHEST, ABDOMEN, AND PELVIS WITH CONTRAST
TECHNIQUE: Multidetector CT imaging of the chest, abdomen and pelvis was
performed following the standard protocol during bolus
administration of intravenous contrast.
CONTRAST:  100mL OMNIPAQUE IOHEXOL 300 MG/ML  SOLN

[Series 2: cap with · axial · 0.80mm/px · z∈[-434,+111]mm · 10 of 135 slices shown, 13 images]
[im 13/135  mediastinal]
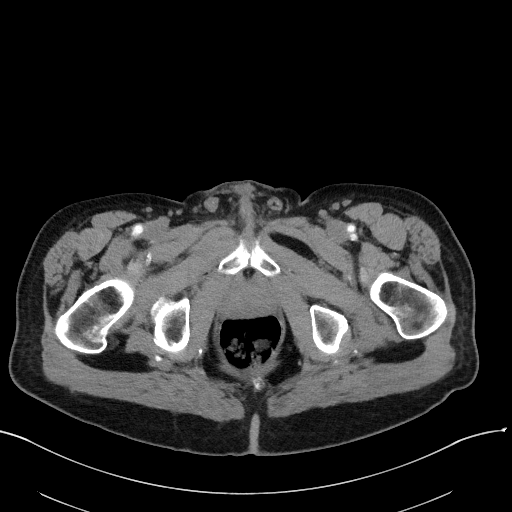
[im 13/135  lung]
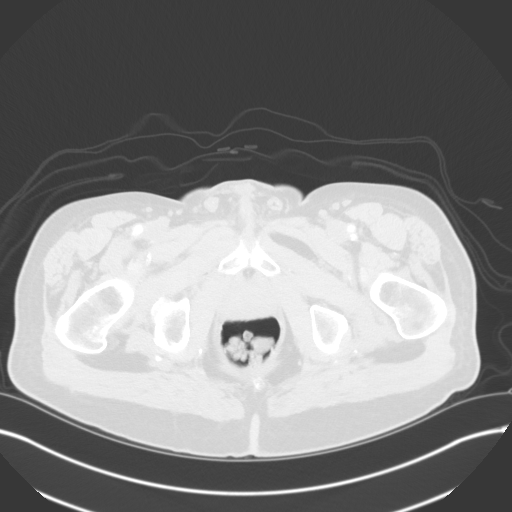
[im 25/135  lung]
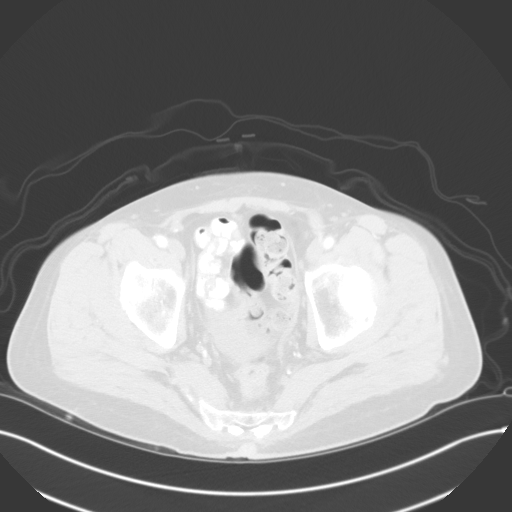
[im 37/135  lung]
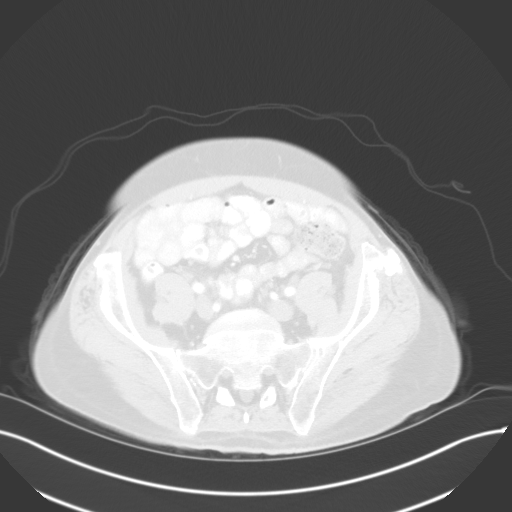
[im 49/135  lung]
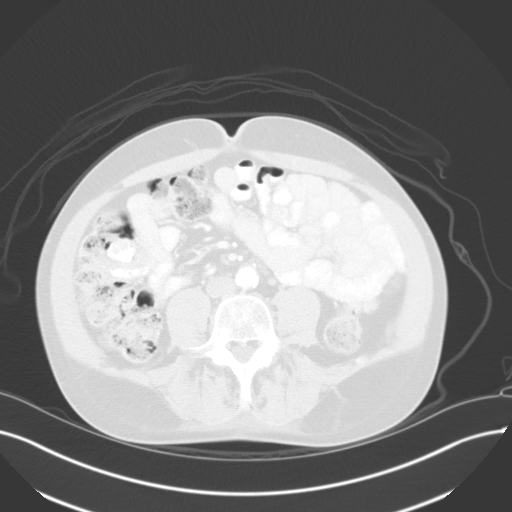
[im 61/135  mediastinal]
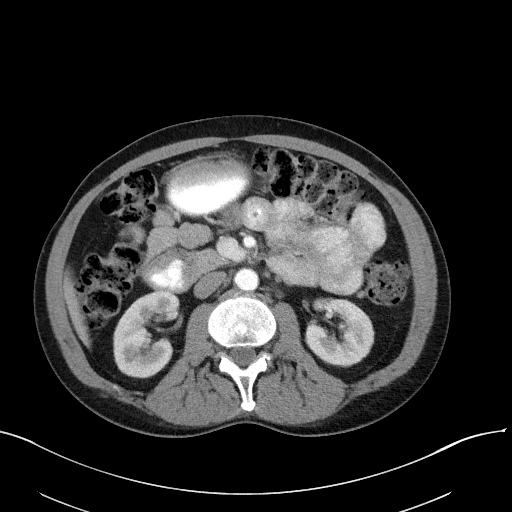
[im 61/135  lung]
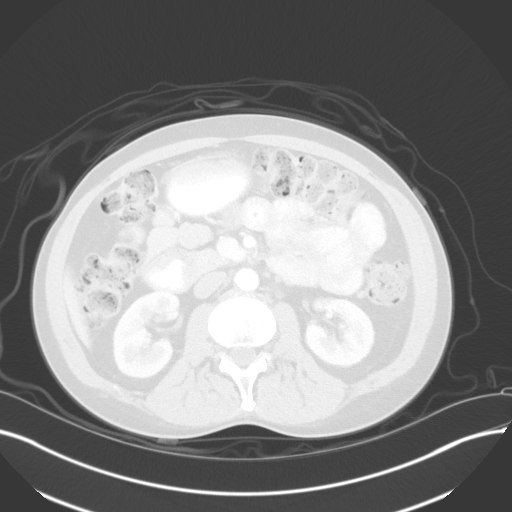
[im 74/135  lung]
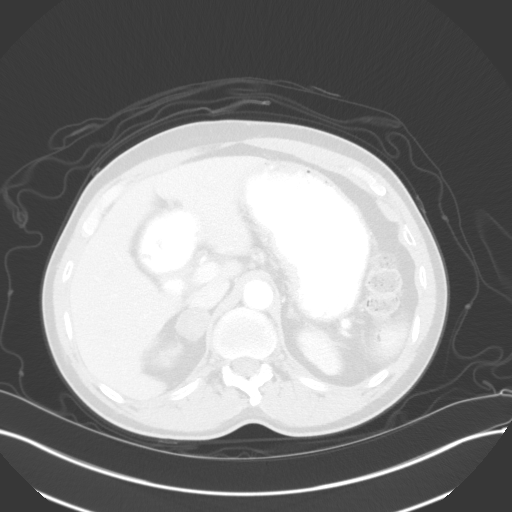
[im 86/135  lung]
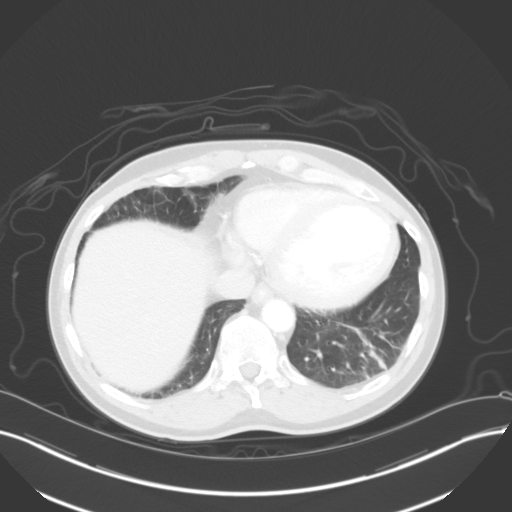
[im 98/135  lung]
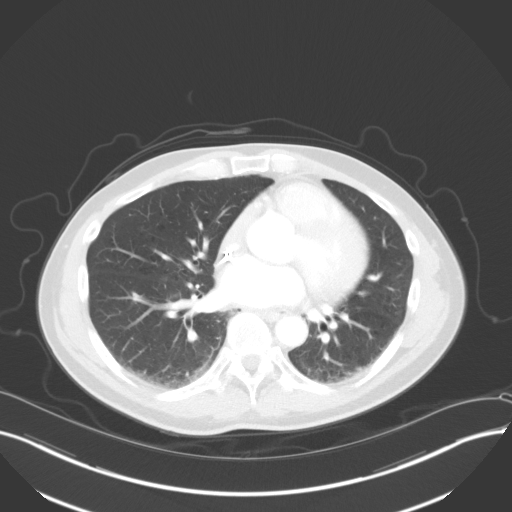
[im 110/135  mediastinal]
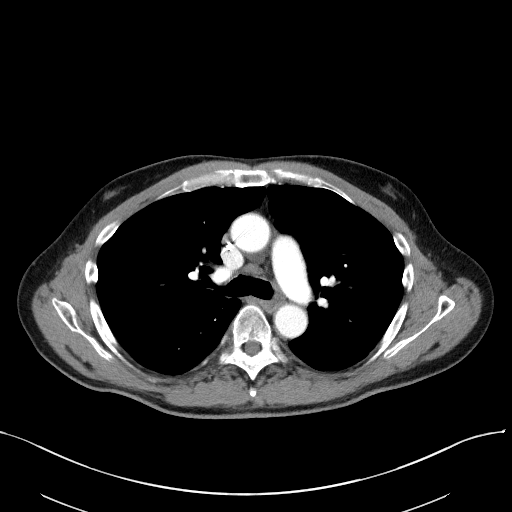
[im 110/135  lung]
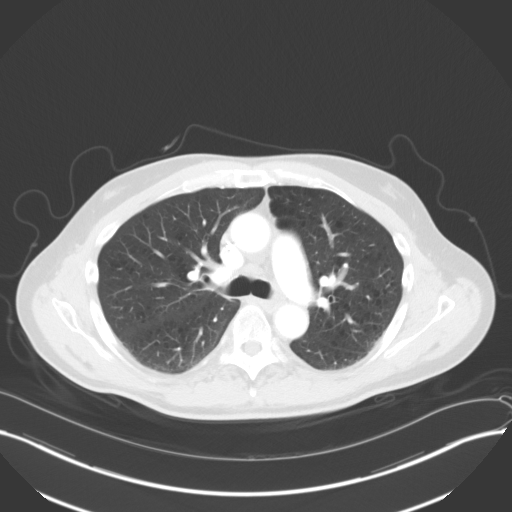
[im 122/135  lung]
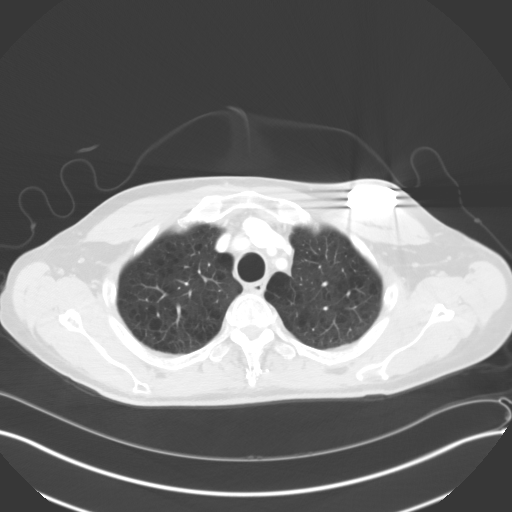

[Series 4: coronals · coronal · 1.14mm/px · 3 of 123 slices shown]
[im 25/123  lung]
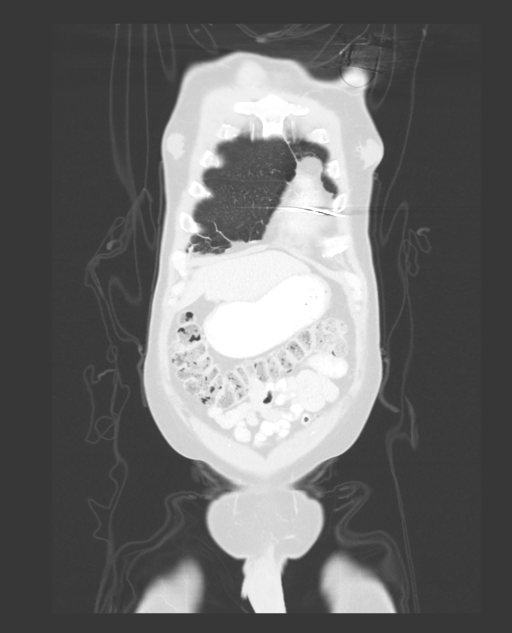
[im 49/123  lung]
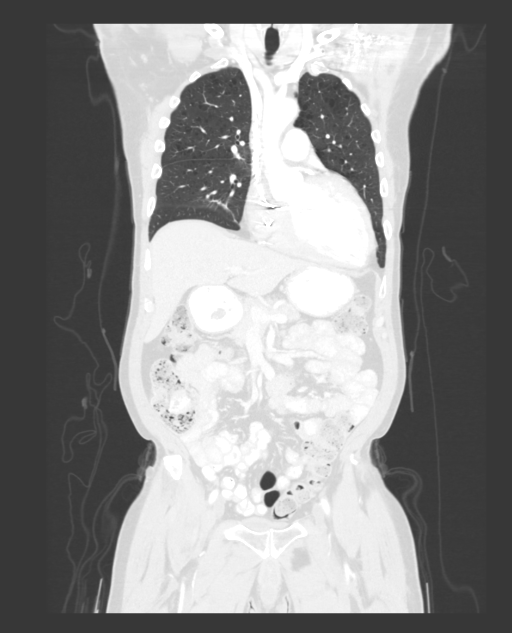
[im 74/123  lung]
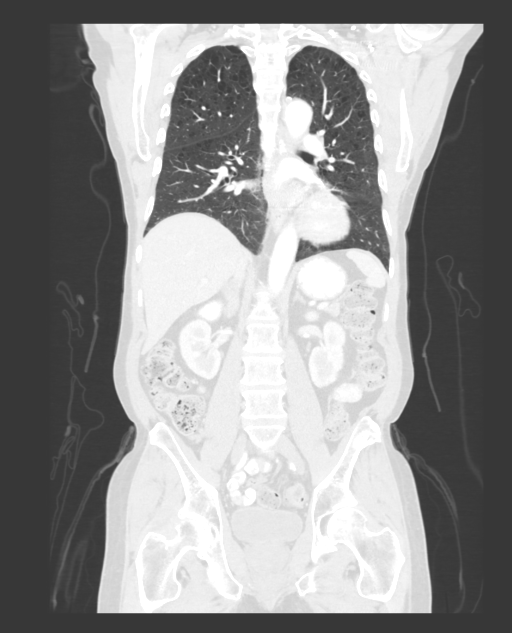

[13 of 36 positions shown; findings below may reference images not displayed]

FINDINGS: CT CHEST FINDINGS

Cardiovascular: Pacer. Aortic atherosclerosis. Tortuous thoracic
aorta. Mild cardiomegaly with coronary artery atherosclerosis. No
central pulmonary embolism, on this non-dedicated study.

Mediastinum/Nodes: No supraclavicular adenopathy. No mediastinal or
hilar adenopathy. Esophageal fluid level on [DATE].

Lungs/Pleura: No pleural fluid. Moderate centrilobular and
paraseptal emphysema.

Right middle lobe pulmonary nodule measures 1.1 x 0.7 cm on 100/6
versus 1.3 x 0.6 cm on the prior exam (when remeasured). This
suggests stability. Left base scarring.

Musculoskeletal: Moderate bilateral gynecomastia. No acute osseous
abnormality.

CT ABDOMEN PELVIS FINDINGS

Hepatobiliary: Normal liver. Cholecystectomy, without biliary ductal
dilatation.

Pancreas: Normal, without mass or ductal dilatation.

Spleen: Normal in size, without focal abnormality.

Adrenals/Urinary Tract: Normal left adrenal gland. Right adrenal
nodule of 2.6 cm has been characterized as an adenoma on [DATE]
PET. Normal left kidney. Right renal too small to characterize
lesions. Mild lower pole right renal scarring. Punctate lower pole
right renal collecting system calculus. No hydronephrosis. Normal
urinary bladder.

Stomach/Bowel: Normal stomach, without wall thickening. Colonic
stool burden suggests constipation. Normal terminal ileum and
appendix. Normal small bowel.

Vascular/Lymphatic: Aortic atherosclerosis. No abdominopelvic
adenopathy.

Reproductive: Normal prostate.

Other: No significant free fluid. No evidence of omental or
peritoneal disease.

Musculoskeletal: Degenerative changes of both hips. Mild osteopenia.
Remote trauma or surgery involving the left iliac crest. Right L5
pars defect with trace L5-S1 anterolisthesis.
IMPRESSION: 1. Similar size of a right middle lobe pulmonary nodule.
2. No thoracic adenopathy.
3. No findings to suggest metastatic disease in the chest, abdomen,
or pelvis.
4. Aortic atherosclerosis ([4S]-[4S]), coronary artery
atherosclerosis and emphysema ([4S]-[4S]).
5. Esophageal air fluid level suggests dysmotility or
gastroesophageal reflux.
6. Bilateral gynecomastia.
7. Right adrenal adenoma.
8. Right nephrolithiasis.

## 2020-09-17 MED ORDER — SODIUM CHLORIDE 0.9 % IV SOLN
Freq: Once | INTRAVENOUS | Status: AC
  Filled 2020-09-17: qty 250

## 2020-09-17 MED ORDER — SODIUM CHLORIDE 0.9 % IV SOLN
1500.0000 mg | Freq: Once | INTRAVENOUS | Status: AC
  Administered 2020-09-17: 1500 mg via INTRAVENOUS
  Filled 2020-09-17: qty 30

## 2020-09-17 MED ORDER — IOHEXOL 300 MG/ML  SOLN
100.0000 mL | Freq: Once | INTRAMUSCULAR | Status: AC | PRN
  Administered 2020-09-17: 100 mL via INTRAVENOUS

## 2020-09-17 NOTE — Patient Instructions (Signed)
[facility] Discharge Instructions for Patients Receiving Chemotherapy  Today you received the following chemotherapy agents: durvalumab.  To help prevent nausea and vomiting after your treatment, we encourage you to take your nausea medication as directed.   If you develop nausea and vomiting that is not controlled by your nausea medication, call the clinic.   BELOW ARE SYMPTOMS THAT SHOULD BE REPORTED IMMEDIATELY:  *FEVER GREATER THAN 100.5 F  *CHILLS WITH OR WITHOUT FEVER  NAUSEA AND VOMITING THAT IS NOT CONTROLLED WITH YOUR NAUSEA MEDICATION  *UNUSUAL SHORTNESS OF BREATH  *UNUSUAL BRUISING OR BLEEDING  TENDERNESS IN MOUTH AND THROAT WITH OR WITHOUT PRESENCE OF ULCERS  *URINARY PROBLEMS  *BOWEL PROBLEMS  UNUSUAL RASH Items with * indicate a potential emergency and should be followed up as soon as possible.  Feel free to call the clinic should you have any questions or concerns. The clinic phone number is [phone].  Please show the CHEMO ALERT CARD at check-in to the Emergency Department and triage nurse.   

## 2020-09-17 NOTE — Progress Notes (Signed)
Patient stable upon departure of infusion clinic.

## 2020-09-18 ENCOUNTER — Telehealth: Payer: Medicare Other | Admitting: Internal Medicine

## 2020-09-19 ENCOUNTER — Other Ambulatory Visit: Payer: Self-pay

## 2020-09-19 ENCOUNTER — Ambulatory Visit (INDEPENDENT_AMBULATORY_CARE_PROVIDER_SITE_OTHER): Payer: Medicare Other | Admitting: *Deleted

## 2020-09-19 DIAGNOSIS — I428 Other cardiomyopathies: Secondary | ICD-10-CM | POA: Diagnosis not present

## 2020-09-19 DIAGNOSIS — Z7901 Long term (current) use of anticoagulants: Secondary | ICD-10-CM

## 2020-09-19 LAB — POCT INR: INR: 1.5 — AB (ref 2.0–3.0)

## 2020-09-19 NOTE — Patient Instructions (Signed)
Description   Today and tomorrow take 1 tablet of Warfarin then continue taking 1/2 tablet daily except 1 tablet on Tuesdays. Recheck in 2 weeks.  Main # [phone] Coumadin Clinic # [phone].

## 2020-09-20 ENCOUNTER — Telehealth: Payer: Self-pay | Admitting: Physician Assistant

## 2020-09-20 NOTE — Telephone Encounter (Signed)
Scheduled appts per los. Cancelled appts that were on the wrong dates. Called and left detailed msg about new dates and times. Mailed printout

## 2020-09-24 ENCOUNTER — Ambulatory Visit: Payer: Medicare Other | Attending: Internal Medicine

## 2020-09-24 ENCOUNTER — Other Ambulatory Visit: Payer: Self-pay | Admitting: Physician Assistant

## 2020-09-24 DIAGNOSIS — Z23 Encounter for immunization: Secondary | ICD-10-CM

## 2020-09-24 DIAGNOSIS — C3411 Malignant neoplasm of upper lobe, right bronchus or lung: Secondary | ICD-10-CM

## 2020-09-24 MED ORDER — LIDOCAINE-PRILOCAINE 2.5-2.5 % EX CREA: 1.0000 "application " | TOPICAL_CREAM | CUTANEOUS | 2 refills | Status: DC | PRN

## 2020-09-24 NOTE — Progress Notes (Signed)
   [id number]  Name:  Dennis [name].    MRN: [id number] DOB: 01/08/1961  09/24/2020  [name] was observed post Covid-19 immunization for 15 minutes without incident. He was provided with Vaccine Information Sheet and instruction to access the V-Safe system.   [name] was instructed to call 911 with any severe reactions post vaccine: Marland Kitchen Difficulty breathing  . Swelling of face and throat  . A fast heartbeat  . A bad rash all over body  . Dizziness and weakness   Immunizations Administered    Name Date Dose VIS Date Route   Pfizer COVID-19 Vaccine 09/24/2020  1:21 PM 0.3 mL 08/01/2020 Intramuscular   Manufacturer: [place]   Lot: [id number]   NDC: [address]-0

## 2020-09-28 ENCOUNTER — Encounter: Payer: Self-pay | Admitting: Internal Medicine

## 2020-09-28 ENCOUNTER — Other Ambulatory Visit: Payer: Self-pay

## 2020-09-28 ENCOUNTER — Ambulatory Visit (INDEPENDENT_AMBULATORY_CARE_PROVIDER_SITE_OTHER): Payer: Medicare Other | Admitting: Internal Medicine

## 2020-09-28 VITALS — BP 114/72 | HR 94 | Ht 72.0 in | Wt 177.2 lb

## 2020-09-28 DIAGNOSIS — Z95810 Presence of automatic (implantable) cardiac defibrillator: Secondary | ICD-10-CM | POA: Diagnosis not present

## 2020-09-28 DIAGNOSIS — I5022 Chronic systolic (congestive) heart failure: Secondary | ICD-10-CM

## 2020-09-28 DIAGNOSIS — I255 Ischemic cardiomyopathy: Secondary | ICD-10-CM

## 2020-09-28 LAB — CUP PACEART INCLINIC DEVICE CHECK
Battery Remaining Longevity: 0 mo
Brady Statistic RA Percent Paced: 0.03 %
Brady Statistic RV Percent Paced: 0 %
Date Time Interrogation Session: 20211217100705
HighPow Impedance: 60.75 Ohm
Implantable Lead Implant Date: 20110513
Implantable Lead Implant Date: 20110513
Implantable Lead Location: 753859
Implantable Lead Location: 753860
Implantable Pulse Generator Implant Date: 20110513
Lead Channel Impedance Value: 287.5 Ohm
Lead Channel Impedance Value: 325 Ohm
Lead Channel Pacing Threshold Amplitude: 0.5 V
Lead Channel Pacing Threshold Amplitude: 0.75 V
Lead Channel Pacing Threshold Pulse Width: 0.5 ms
Lead Channel Pacing Threshold Pulse Width: 0.5 ms
Lead Channel Sensing Intrinsic Amplitude: 12 mV
Lead Channel Sensing Intrinsic Amplitude: 5 mV
Lead Channel Setting Pacing Amplitude: 2.5 V
Lead Channel Setting Pacing Pulse Width: 0.5 ms
Lead Channel Setting Sensing Sensitivity: 0.5 mV
Pulse Gen Serial Number: 608100

## 2020-09-28 NOTE — Patient Instructions (Signed)
Medication Instructions:  Your physician recommends that you continue on your current medications as directed. Please refer to the Current Medication list given to you today.  Labwork: None ordered.  Testing/Procedures: None ordered.  Follow-Up: We will review your records in 3 months and follow up.  Remote monitoring is used to monitor your ICD from home.  Any Other Special Instructions Will Be Listed Below (If Applicable).  If you need a refill on your cardiac medications before your next appointment, please call your pharmacy.

## 2020-09-28 NOTE — Progress Notes (Signed)
HPI Dennis [name] returns today for followup. He has a h/o chronic systolic heart failure and lung CA with mets, s/p XRT and chemo. He is in the process of restaging although he has had at least one met to the brain. He feels well and has gained weight. He denies ICD therapies. He is at [facility] on his device. He denies edema.  No Known Allergies   Current Outpatient Medications  Medication Sig Dispense Refill  . Accu-Chek Softclix Lancets lancets     . atorvastatin (LIPITOR) 20 MG tablet TAKE 1 TABLET BY MOUTH DAILY AT 6 PM. 30 tablet 6  . Blood Glucose Monitoring Suppl (FIFTY50 GLUCOSE METER 2.0) w/Device KIT Use as instructed    . dexamethasone (DECADRON) 2 MG tablet Take 1 tablet (2 mg total) by mouth daily. 60 tablet 0  . feeding supplement (ENSURE ENLIVE / ENSURE PLUS) LIQD Take 237 mLs by mouth 2 (two) times daily between meals. 237 mL 0  . insulin glargine (LANTUS SOLOSTAR) 100 UNIT/ML Solostar Pen Inject 25 Units into the skin 2 (two) times daily. 15 mL 2  . insulin lispro (HUMALOG KWIKPEN) 100 UNIT/ML KwikPen Inject 6 Units into the skin 3 (three) times daily. 15 mL 1  . INSUPEN ULTRAFIN 31G X 8 MM MISC     . lidocaine-prilocaine (EMLA) cream Apply 1 application topically as needed. 30 g 2  . midodrine (PROAMATINE) 10 MG tablet Take 1 tablet (10 mg total) by mouth 3 (three) times daily with meals. 90 tablet 2  . sennosides-docusate sodium (SENOKOT-S) 8.6-50 MG tablet Take 1 tablet by mouth daily. As needed    . sodium chloride 1 g tablet Take 2 tablets (2 g total) by mouth 3 (three) times daily with meals. 180 tablet 1  . warfarin (COUMADIN) 7.5 MG tablet TAKE 1 TABLET BY MOUTH DAILY AT 6PM. (Patient taking differently: Take 7.5 mg by mouth every evening.) 30 tablet 0   No current facility-administered medications for this visit.     Past Medical History:  Diagnosis Date  . Alcohol abuse with alcohol-induced mood disorder ([employer]) 08/18/2015  . Automatic implantable  cardioverter-defibrillator in situ 2012   S/P St Jude Dual Chamber. ICD.  Marland Kitchen CAD (coronary artery disease) 11/16/2014   CAD Coronary angiography (12/2008) with mid LAD totally occluded and collateralized.  . Cardiac LV ejection fraction 10-20%   . Chest pain 09/04/2018  . CHF (congestive heart failure) ([place])   . Chronic systolic heart failure (HCC)    a) Mixed ICM/NICM b) RHC (05/2014): RA 2, RV 19/2/3, PA 22/14 (18), PCWP 6, Fick CO/CI: 5.2 / 2.7, PVR 2.3 WU, PA 60% and 64% c) ECHO (05/2014): EF 20-25%, diff HK, akinesis entireanteroseptal myocardium, triv AI, mod MR, LA mod/sev dilated  . Cocaine abuse ([place]) 01/24/2015  . Cocaine abuse with cocaine-induced mood disorder ([place]) 08/20/2015  . Drug abuse and dependence ([place])   . Dysphagia following cerebral infarction 01/24/2015  . Embolic stroke involving right middle cerebral artery ([place])   . Extensive stage primary small cell carcinoma of lung ([place]) 01/04/2020  . H/O noncompliance with medical treatment, presenting hazards to health 01/24/2015  . Heart murmur   . High cholesterol   . Ischemic cardiomyopathy    a) Coronary angiography (12/2008) at [facility]: L[place]: nl, LAD mid 100% stenosis with left to left and right-to-left collaterals to the distal LAD; Lcx: nl, RCA nl.    . 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[place])   . MDD (major depressive disorder),  recurrent severe, without psychosis ([place]) 08/18/2015  . Open wound of foot 02/27/2015  . Pneumonia 1988  . Psychoactive substance-induced mood disorder ([place]) 07/17/2015  . Sleep apnea    "cleared after T&A"  . Status post PICC central line placement   . Stroke ([place])   . Substance induced mood disorder ([place]) 08/17/2015  . Type II diabetes mellitus (HCC)     ROS:   All systems reviewed and negative except as noted in the HPI.   Past Surgical History:  Procedure Laterality Date  . BRONCHIAL BRUSHINGS  12/28/2019   Procedure: BRONCHIAL BRUSHINGS;  Surgeon: Garner Nash, DO;  Location: [place]  ENDOSCOPY;  Service: Thoracic;;  . BRONCHIAL NEEDLE ASPIRATION BIOPSY  12/28/2019   Procedure: BRONCHIAL NEEDLE ASPIRATION BIOPSIES;  Surgeon: Garner Nash, DO;  Location: [place];  Service: Thoracic;;  . BRONCHIAL WASHINGS  12/28/2019   Procedure: BRONCHIAL WASHINGS;  Surgeon: Garner Nash, DO;  Location: [place];  Service: Thoracic;;  . CARDIAC CATHETERIZATION  05/2014  . CARDIAC DEFIBRILLATOR PLACEMENT  03/2011  . CHOLECYSTECTOMY  1994  . FOREARM FRACTURE SURGERY Left 1980  . FRACTURE SURGERY    . RIGHT HEART CATHETERIZATION N/A 05/30/2014   Procedure: RIGHT HEART CATH;  Surgeon: Jolaine Artist, MD;  Location:  [facility] CATH LAB;  Service: Cardiovascular;  Laterality: N/A;  . RIGHT HEART CATHETERIZATION N/A 02/07/2015   Procedure: RIGHT HEART CATH;  Surgeon: Jolaine Artist, MD;  Location: [facility] CATH LAB;  Service: Cardiovascular;  Laterality: N/A;  . TONSILLECTOMY AND ADENOIDECTOMY  ~ 1998  . VIDEO BRONCHOSCOPY WITH ENDOBRONCHIAL ULTRASOUND N/A 12/28/2019   Procedure: VIDEO BRONCHOSCOPY WITH ENDOBRONCHIAL ULTRASOUND;  Surgeon: Garner Nash, DO;  Location: [facility] ENDOSCOPY;  Service: Thoracic;  Laterality: N/A;     Family History  Problem Relation Age of Onset  . Diabetes Mother   . Heart disease Mother   . Diabetes Father   . Prostate cancer Father   . Heart disease Father   . Diabetes Brother   . Diabetes Brother      Social History   Socioeconomic History  . Marital status: Single    Spouse name: Not on file  . Number of children: 1  . Years of education: 3  . Highest education level: Not on file  Occupational History  . Occupation: Disabled  Tobacco Use  . Smoking status: Former Smoker    Packs/day: 0.25    Years: 31.00    Pack years: 7.75    Types: Cigarettes  . Smokeless tobacco: Never Used  Vaping Use  . Vaping Use: Never used  Substance and Sexual Activity  . Alcohol use: Not Currently    Alcohol/week: 0.0 standard drinks    Comment:  09/21/2014 "might have a drink q 6 /months, if that"  . Drug use: Yes    Types: Cocaine    Comment: last use 07/06/2020  . Sexual activity: Yes  Other Topics Concern  . Not on file  Social History Narrative   Lives in [place] with his mother. No pets. Fun: movies, sports, daughter.    Denies religious beliefs that would effect health care.    Social Determinants of Health   Financial Resource Strain: Not on file  Food Insecurity: Not on file  Transportation Needs: Not on file  Physical Activity: Not on file  Stress: Not on file  Social Connections: Not on file  Intimate Partner Violence: Not on file     BP 114/72   Pulse 94  Ht 6' (1.829 m)   Wt 177 lb 3.2 oz (80.4 kg)   SpO2 99%   BMI 24.03 kg/m   Physical Exam:  Well appearing NAD HEENT: Unremarkable Neck:  No JVD, no thyromegally Lymphatics:  No adenopathy Back:  No CVA tenderness Lungs:  Clear with no wheezes HEART:  Regular rate rhythm, no murmurs, no rubs, no clicks Abd:  soft, positive bowel sounds, no organomegally, no rebound, no guarding Ext:  2 plus pulses, no edema, no cyanosis, no clubbing Skin:  No rashes no nodules Neuro:  CN II through XII intact, motor grossly intact  EKG - none  DEVICE  Normal device function.  See PaceArt for details. ERI  Assess/Plan: 1. Chronic systolic heart failure - he is class 2. He will continue his current meds. 2. Met lung CA - he is in the process of being treated. 3. ICD - his device is at [facility] but has at least 3 months before EOL. I would recommend that he undergo watchful waiting. If he were to have a really good response to chemo then we might consider placing another ICD. However, in the setting of primary prevention and met lung CA and likely poor prognosis, I would recommend holding off on this for now. His device is still functioning and I would not turn off at this time unless the patient prefers which he does not currently. 4. Orthostasis - his bp is  better after stopping his afterload reduction.   Carleene Overlie Bexleigh Theriault,MD

## 2020-10-03 ENCOUNTER — Telehealth: Payer: Self-pay | Admitting: *Deleted

## 2020-10-03 NOTE — Telephone Encounter (Signed)
Called pt since he missed appt this morning; there was no answer so left a message for him to call back. Will await a response from the pt.

## 2020-10-04 ENCOUNTER — Ambulatory Visit (INDEPENDENT_AMBULATORY_CARE_PROVIDER_SITE_OTHER): Payer: Medicare Other | Admitting: *Deleted

## 2020-10-04 ENCOUNTER — Other Ambulatory Visit: Payer: Self-pay

## 2020-10-04 DIAGNOSIS — Z7901 Long term (current) use of anticoagulants: Secondary | ICD-10-CM | POA: Diagnosis not present

## 2020-10-04 DIAGNOSIS — I428 Other cardiomyopathies: Secondary | ICD-10-CM | POA: Diagnosis not present

## 2020-10-04 LAB — POCT INR: INR: 1.5 — AB (ref 2.0–3.0)

## 2020-10-04 NOTE — Patient Instructions (Signed)
Description   Today and tomorrow take 1 tablet of Warfarin then start taking 1/2 tablet daily except 1 tablet on Sundays and Thursdays. Recheck in 1.5 weeks per request.  Main # [phone] [facility] # [phone].

## 2020-10-09 ENCOUNTER — Ambulatory Visit: Payer: Medicare Other | Admitting: Internal Medicine

## 2020-10-09 ENCOUNTER — Ambulatory Visit: Payer: Medicare Other

## 2020-10-09 ENCOUNTER — Other Ambulatory Visit: Payer: Medicare Other

## 2020-10-10 ENCOUNTER — Other Ambulatory Visit: Payer: Self-pay | Admitting: Student

## 2020-10-11 ENCOUNTER — Ambulatory Visit (HOSPITAL_COMMUNITY)
Admission: RE | Admit: 2020-10-11 | Discharge: 2020-10-11 | Disposition: A | Discharge status: Home / Self Care | Payer: Medicare Other | Source: Ambulatory Visit | Attending: Physician Assistant | Admitting: Physician Assistant

## 2020-10-11 ENCOUNTER — Encounter (HOSPITAL_COMMUNITY): Payer: Self-pay

## 2020-10-11 ENCOUNTER — Other Ambulatory Visit: Payer: Self-pay | Admitting: Physician Assistant

## 2020-10-11 ENCOUNTER — Other Ambulatory Visit: Payer: Self-pay

## 2020-10-11 DIAGNOSIS — Z7901 Long term (current) use of anticoagulants: Secondary | ICD-10-CM | POA: Insufficient documentation

## 2020-10-11 DIAGNOSIS — Z8673 Personal history of transient ischemic attack (TIA), and cerebral infarction without residual deficits: Secondary | ICD-10-CM | POA: Insufficient documentation

## 2020-10-11 DIAGNOSIS — Z87891 Personal history of nicotine dependence: Secondary | ICD-10-CM | POA: Insufficient documentation

## 2020-10-11 DIAGNOSIS — C3411 Malignant neoplasm of upper lobe, right bronchus or lung: Secondary | ICD-10-CM | POA: Diagnosis present

## 2020-10-11 DIAGNOSIS — Z794 Long term (current) use of insulin: Secondary | ICD-10-CM | POA: Insufficient documentation

## 2020-10-11 DIAGNOSIS — Z8042 Family history of malignant neoplasm of prostate: Secondary | ICD-10-CM | POA: Diagnosis not present

## 2020-10-11 DIAGNOSIS — Z79899 Other long term (current) drug therapy: Secondary | ICD-10-CM | POA: Insufficient documentation

## 2020-10-11 DIAGNOSIS — C7931 Secondary malignant neoplasm of brain: Secondary | ICD-10-CM | POA: Insufficient documentation

## 2020-10-11 DIAGNOSIS — Z95810 Presence of automatic (implantable) cardiac defibrillator: Secondary | ICD-10-CM | POA: Insufficient documentation

## 2020-10-11 LAB — CBC
HCT: 39.9 % (ref 39.0–52.0)
Hemoglobin: 12.4 g/dL — ABNORMAL LOW (ref 13.0–17.0)
MCH: 30.7 pg (ref 26.0–34.0)
MCHC: 31.1 g/dL (ref 30.0–36.0)
MCV: 98.8 fL (ref 80.0–100.0)
Platelets: 192 10*3/uL (ref 150–400)
RBC: 4.04 MIL/uL — ABNORMAL LOW (ref 4.22–5.81)
RDW: 14.9 % (ref 11.5–15.5)
WBC: 8.1 10*3/uL (ref 4.0–10.5)
nRBC: 0 % (ref 0.0–0.2)

## 2020-10-11 LAB — HEMOGLOBIN A1C
Hgb A1c MFr Bld: 8.7 % — ABNORMAL HIGH (ref 4.8–5.6)
Mean Plasma Glucose: 202.99 mg/dL

## 2020-10-11 LAB — GLUCOSE, CAPILLARY
Glucose-Capillary: 296 mg/dL — ABNORMAL HIGH (ref 70–99)
Glucose-Capillary: 317 mg/dL — ABNORMAL HIGH (ref 70–99)

## 2020-10-11 LAB — PROTIME-INR
INR: 1.3 — ABNORMAL HIGH (ref 0.8–1.2)
Prothrombin Time: 15.8 seconds — ABNORMAL HIGH (ref 11.4–15.2)

## 2020-10-11 IMAGING — US IR IMAGING GUIDED PORT INSERTION
2 series · 2 of 2 positions shown · non-contrast
Comparison: none

CLINICAL DATA: History of metastatic small cell carcinoma of the
lung and need for porta cath for continued chemotherapy. The patient
has an indwelling left-sided subclavian and chest wall pacing/ICD
device.

[Series 1: (id) · 1 of 1 slices shown]
[im 1/1]
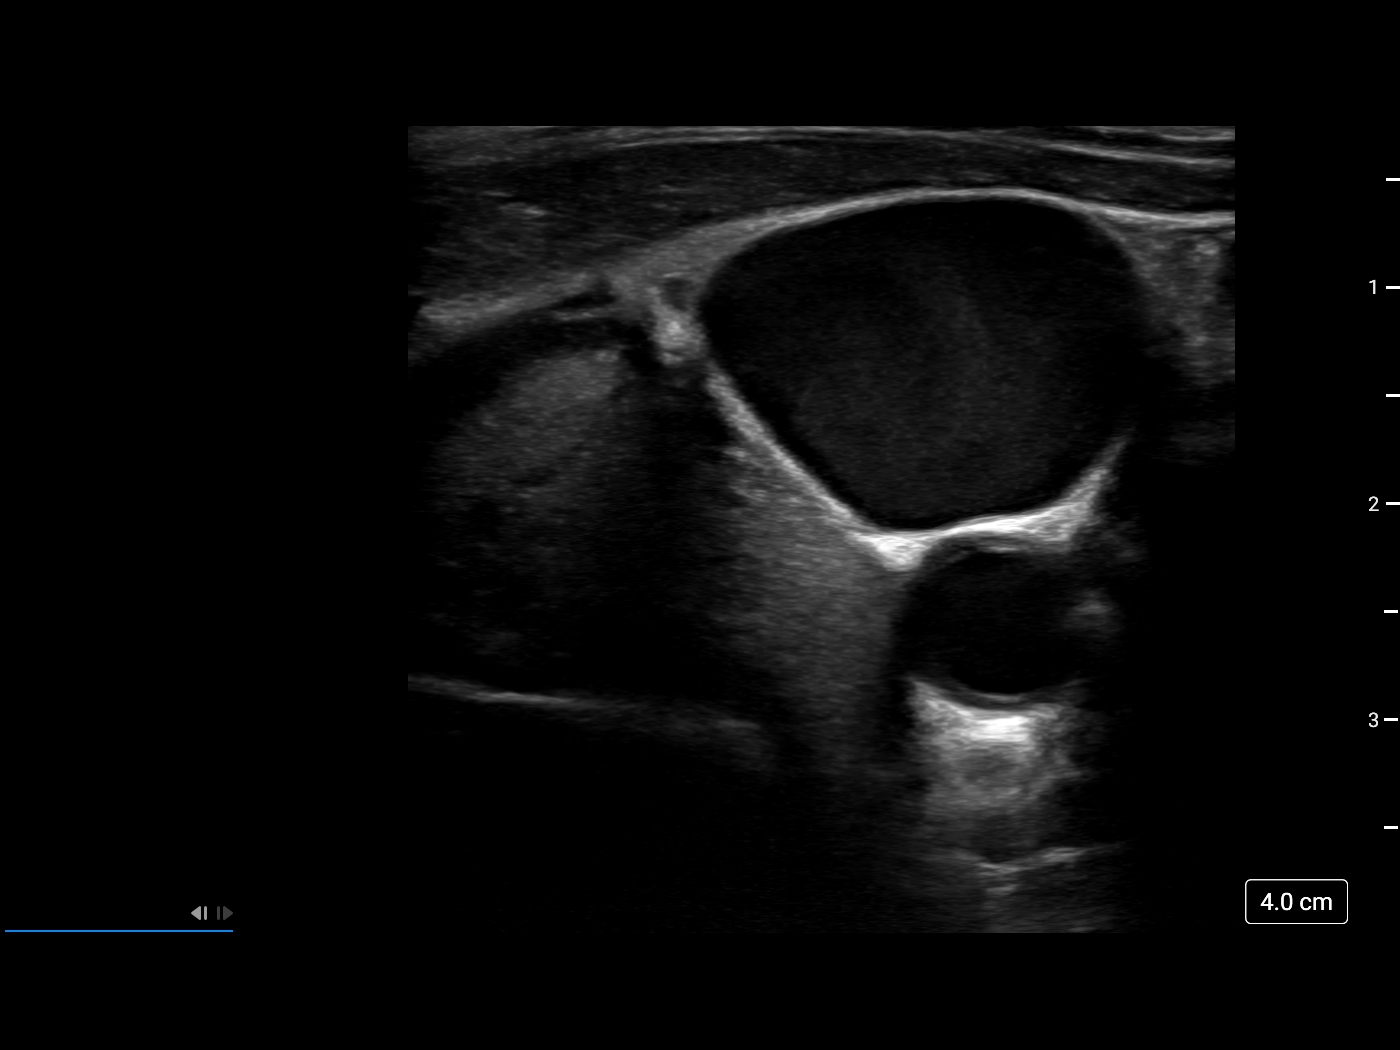

[Series 300: ir perc tun perit cath w/port s&i /imag · 1 of 1 slices shown]
[im 1/1]
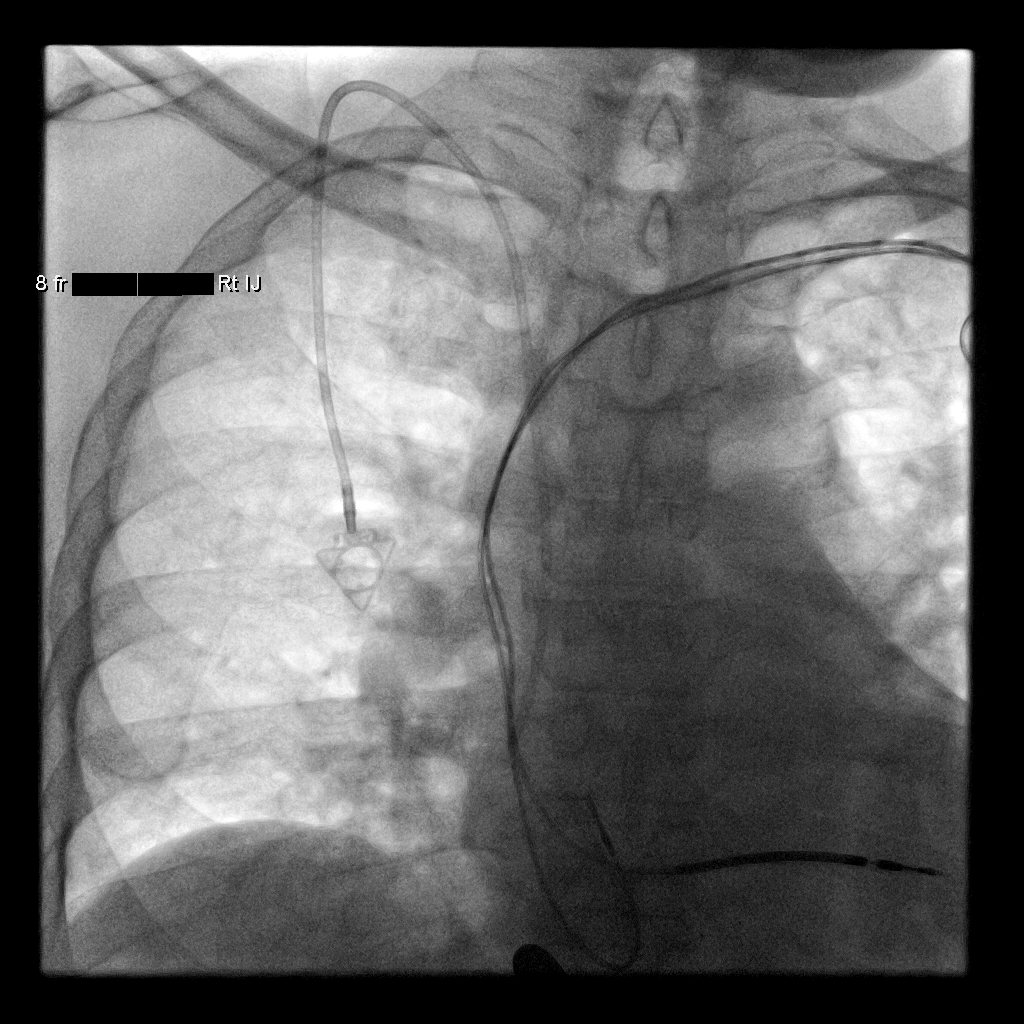

[2 of 2 positions shown; findings below may reference images not displayed]

EXAM:
IMPLANTED PORT A CATH PLACEMENT WITH ULTRASOUND AND FLUOROSCOPIC
GUIDANCE

ANESTHESIA/SEDATION:
4.0 mg IV Versed; 100 mcg IV Fentanyl

Total Moderate Sedation Time:  31 minutes

The patient's level of consciousness and physiologic status were
continuously monitored during the procedure by Radiology nursing.

Additional Medications: 2 g IV Ancef and 25 mg IV Benadryl.

FLUOROSCOPY TIME:  18 seconds.  3.0 mGy.

PROCEDURE:
The procedure, risks, benefits, and alternatives were explained to
the patient. Questions regarding the procedure were encouraged and
answered. The patient understands and consents to the procedure. A
time-out was performed prior to initiating the procedure.

Ultrasound was utilized to confirm patency of the right internal
jugular vein. The right neck and chest were prepped with
chlorhexidine in a sterile fashion, and a sterile drape was applied
covering the operative field. Maximum barrier sterile technique with
sterile gowns and gloves were used for the procedure. Local
anesthesia was provided with 1% lidocaine.

After creating a small venotomy incision, a 21 gauge needle was
advanced into the right internal jugular vein under direct,
real-time ultrasound guidance. Ultrasound image documentation was
performed. After securing guidewire access, an 8 Fr dilator was
placed. A J-wire was kinked to measure appropriate catheter length.

A subcutaneous port pocket was then created along the upper chest
wall utilizing sharp and blunt dissection. Portable cautery was
utilized. The pocket was irrigated with sterile saline.

A single lumen power injectable port was chosen for placement. The 8
Fr catheter was tunneled from the port pocket site to the venotomy
incision. The port was placed in the pocket. External catheter was
trimmed to appropriate length based on guidewire measurement.

At the venotomy, an 8 Fr peel-away sheath was placed over a
guidewire. The catheter was then placed through the sheath and the
sheath removed. Final catheter positioning was confirmed and
documented with a fluoroscopic spot image. The port was accessed
with a needle and aspirated and flushed with heparinized saline. The
access needle was removed.

The venotomy and port pocket incisions were closed with subcutaneous
3-0 Monocryl and subcuticular 4-0 Vicryl. Dermabond was applied to
both incisions.

COMPLICATIONS:
COMPLICATIONS
None
FINDINGS: After catheter placement, the tip lies at the TASOULIS junction.
The catheter aspirates normally and is ready for immediate use.
IMPRESSION: Placement of single lumen port a cath via right internal jugular
vein. The catheter tip lies at the TASOULIS junction. A power
injectable port a cath was placed and is ready for immediate use.

## 2020-10-11 MED ORDER — MIDAZOLAM HCL 2 MG/2ML IJ SOLN
INTRAMUSCULAR | Status: AC
  Filled 2020-10-11: qty 4

## 2020-10-11 MED ORDER — INSULIN ASPART 100 UNIT/ML ~~LOC~~ SOLN
SUBCUTANEOUS | Status: AC
  Filled 2020-10-11: qty 1

## 2020-10-11 MED ORDER — INSULIN ASPART 100 UNIT/ML ~~LOC~~ SOLN
0.0000 [IU] | Freq: Three times a day (TID) | SUBCUTANEOUS | Status: DC
  Filled 2020-10-11: qty 0.15

## 2020-10-11 MED ORDER — HEPARIN SOD (PORK) LOCK FLUSH 100 UNIT/ML IV SOLN
INTRAVENOUS | Status: AC | PRN
  Administered 2020-10-11: 500 [IU] via INTRAVENOUS

## 2020-10-11 MED ORDER — SODIUM CHLORIDE 0.9 % IV SOLN: INTRAVENOUS | Status: DC

## 2020-10-11 MED ORDER — CEFAZOLIN SODIUM-DEXTROSE 2-4 GM/100ML-% IV SOLN
INTRAVENOUS | Status: AC
  Administered 2020-10-11: 2 g via INTRAVENOUS
  Filled 2020-10-11: qty 100

## 2020-10-11 MED ORDER — FENTANYL CITRATE (PF) 100 MCG/2ML IJ SOLN
INTRAMUSCULAR | Status: AC | PRN
Start: 2020-10-11 — End: 2020-10-11
  Administered 2020-10-11 (×2): 50 ug via INTRAVENOUS

## 2020-10-11 MED ORDER — CEFAZOLIN SODIUM-DEXTROSE 2-4 GM/100ML-% IV SOLN: 2.0000 g | INTRAVENOUS | Status: AC

## 2020-10-11 MED ORDER — LIDOCAINE HCL (PF) 1 % IJ SOLN
INTRAMUSCULAR | Status: AC | PRN
  Administered 2020-10-11 (×3): 10 mL via INTRADERMAL

## 2020-10-11 MED ORDER — FENTANYL CITRATE (PF) 100 MCG/2ML IJ SOLN
INTRAMUSCULAR | Status: AC
  Filled 2020-10-11: qty 4

## 2020-10-11 MED ORDER — INSULIN ASPART 100 UNIT/ML ~~LOC~~ SOLN
SUBCUTANEOUS | Status: AC
  Administered 2020-10-11: 11 [IU] via SUBCUTANEOUS
  Filled 2020-10-11: qty 1

## 2020-10-11 MED ORDER — DIPHENHYDRAMINE HCL 50 MG/ML IJ SOLN
INTRAMUSCULAR | Status: AC
  Filled 2020-10-11: qty 1

## 2020-10-11 MED ORDER — HEPARIN SOD (PORK) LOCK FLUSH 100 UNIT/ML IV SOLN
INTRAVENOUS | Status: AC
  Filled 2020-10-11: qty 5

## 2020-10-11 MED ORDER — DIPHENHYDRAMINE HCL 50 MG/ML IJ SOLN
INTRAMUSCULAR | Status: AC | PRN
  Administered 2020-10-11: 25 mg via INTRAVENOUS

## 2020-10-11 MED ORDER — LIDOCAINE HCL 1 % IJ SOLN
INTRAMUSCULAR | Status: AC
  Filled 2020-10-11: qty 20

## 2020-10-11 MED ORDER — INSULIN ASPART 100 UNIT/ML ~~LOC~~ SOLN
0.0000 [IU] | Freq: Every day | SUBCUTANEOUS | Status: DC
  Filled 2020-10-11: qty 0.05

## 2020-10-11 MED ORDER — MIDAZOLAM HCL 2 MG/2ML IJ SOLN
INTRAMUSCULAR | Status: AC | PRN
Start: 2020-10-11 — End: 2020-10-11
  Administered 2020-10-11 (×2): 2 mg via INTRAVENOUS

## 2020-10-11 NOTE — Sedation Documentation (Signed)
Patient is resting comfortably, snoring lightly, in NAD.

## 2020-10-11 NOTE — Procedures (Signed)
Interventional Radiology Procedure Note  Procedure: Single Lumen Power Port Placement    Access:  Right IJ vein.  Findings: Catheter tip positioned at SVC/RA junction. Port is ready for immediate use.   Complications: None  EBL: < 10 mL  Recommendations:  - Ok to shower in 24 hours - Do not submerge for 7 days - Routine line care   Esmeralda Malay T. Veria Stradley, M.D Pager:  [phone]   

## 2020-10-11 NOTE — H&P (Signed)
Chief Complaint: Patient was seen in consultation today for port-a-catheter placement  Referring Physician(s): Heilingoetter, Cassandra L  Supervising Physician: Aletta Edouard  Patient Status: [facility] - Out-pt  History of Present Illness: Dennis Krishna Dancel. is a 59 y.o. male with a medical history significant for alcohol and cocaine abuse, CAD, CHF, ischemic cardiomyopathy, AICD (St. 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 He is status post whole brain radiation and is currently receiving systemic chemotherapy via peripheral veins. The oncology team has had an increasingly difficult time with peripheral IV placements.    Interventional Radiology has been asked to evaluate this patient for an image-guided port-a-catheter placement to facilitate his treatment plans.   Past Medical History:  Diagnosis Date  . Alcohol abuse with alcohol-induced mood disorder ([place]) 08/18/2015  . Automatic implantable cardioverter-defibrillator in situ 2012   S/P St Jude Dual Chamber. ICD.  Marland Kitchen CAD (coronary artery disease) 11/16/2014   CAD Coronary angiography (12/2008) with mid LAD totally occluded and collateralized.  . Cardiac LV ejection fraction 10-20%   . Chest pain 09/04/2018  . CHF (congestive heart failure) ([place])   . Chronic systolic heart failure (HCC)    a) Mixed ICM/NICM b) RHC (05/2014): RA 2, RV 19/2/3, PA 22/14 (18), PCWP 6, Fick CO/CI: 5.2 / 2.7, PVR 2.3 WU, PA 60% and 64% c) ECHO (05/2014): EF 20-25%, diff HK, akinesis entireanteroseptal myocardium, triv AI, mod MR, LA mod/sev dilated  . Cocaine abuse ([place]) 01/24/2015  . Cocaine abuse with cocaine-induced mood disorder ([place]) 08/20/2015  . Drug abuse and dependence ([place])   . Dysphagia following cerebral infarction 01/24/2015  . Embolic stroke involving right middle cerebral artery ([place])   . Extensive stage primary small cell carcinoma of lung ([place]) 01/04/2020  . H/O  noncompliance with medical treatment, presenting hazards to health 01/24/2015  . Heart murmur   . High cholesterol   . Ischemic cardiomyopathy    a) Coronary angiography (12/2008) at [facility]: L[place]: nl, LAD mid 100% stenosis with left to left and right-to-left collaterals to the distal LAD; Lcx: nl, RCA nl.    . 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[place])   . MDD (major depressive disorder), recurrent severe, without psychosis ([place]) 08/18/2015  . Open wound of foot 02/27/2015  . Pneumonia 1988  . Psychoactive substance-induced mood disorder ([place]) 07/17/2015  . Sleep apnea    "cleared after T&A"  . Status post PICC central line placement   . Stroke ([place])   . Substance induced mood disorder ([place]) 08/17/2015  . Type II diabetes mellitus ([place])     Past Surgical History:  Procedure Laterality Date  . BRONCHIAL BRUSHINGS  12/28/2019   Procedure: BRONCHIAL BRUSHINGS;  Surgeon: Garner Nash, DO;  Location: [facility];  Service: Thoracic;;  . BRONCHIAL NEEDLE ASPIRATION BIOPSY  12/28/2019   Procedure: BRONCHIAL NEEDLE ASPIRATION BIOPSIES;  Surgeon: Garner Nash, DO;  Location: [place];  Service: Thoracic;;  . BRONCHIAL WASHINGS  12/28/2019   Procedure: BRONCHIAL WASHINGS;  Surgeon: Garner Nash, DO;  Location: [place];  Service: Thoracic;;  . CARDIAC CATHETERIZATION  05/2014  . CARDIAC DEFIBRILLATOR PLACEMENT  03/2011  . CHOLECYSTECTOMY  1994  . FOREARM FRACTURE SURGERY Left 1980  . FRACTURE SURGERY    . RIGHT HEART CATHETERIZATION N/A 05/30/2014   Procedure: RIGHT HEART CATH;  Surgeon: Jolaine Artist, MD;  Location: [facility] CATH LAB;  Service: Cardiovascular;  Laterality: N/A;  . RIGHT HEART CATHETERIZATION N/A  02/07/2015   Procedure: RIGHT HEART CATH;  Surgeon: Jolaine Artist, MD;  Location: [facility] CATH LAB;  Service: Cardiovascular;  Laterality: N/A;  . TONSILLECTOMY AND ADENOIDECTOMY  ~ 1998  . VIDEO BRONCHOSCOPY WITH ENDOBRONCHIAL ULTRASOUND N/A 12/28/2019   Procedure: VIDEO  BRONCHOSCOPY WITH ENDOBRONCHIAL ULTRASOUND;  Surgeon: Garner Nash, DO;  Location: [facility];  Service: Thoracic;  Laterality: N/A;    Allergies: Patient has no known allergies.  Medications: Prior to Admission medications   Medication Sig Start Date End Date Taking? Authorizing Provider  Accu-Chek Softclix Lancets lancets  02/03/20   [provider]  atorvastatin (LIPITOR) 20 MG tablet TAKE 1 TABLET BY MOUTH DAILY AT 6 PM. 03/11/17   Bensimhon, Shaune Pascal, MD  Blood Glucose Monitoring Suppl (FIFTY50 GLUCOSE METER 2.0) w/Device KIT Use as instructed 02/03/20   [provider]  dexamethasone (DECADRON) 2 MG tablet Take 1 tablet (2 mg total) by mouth daily. 08/27/20   Ventura Sellers, MD  feeding supplement (ENSURE ENLIVE / ENSURE PLUS) LIQD Take 237 mLs by mouth 2 (two) times daily between meals. 07/26/20   Eugenie Filler, MD  insulin glargine (LANTUS SOLOSTAR) 100 UNIT/ML Solostar Pen Inject 25 Units into the skin 2 (two) times daily. 07/25/20   Eugenie Filler, MD  insulin lispro (HUMALOG KWIKPEN) 100 UNIT/ML KwikPen Inject 6 Units into the skin 3 (three) times daily. 07/25/20   Eugenie Filler, MD  INSUPEN ULTRAFIN 31G X 8 MM [place]  03/02/20   [provider]  lidocaine-prilocaine (EMLA) cream Apply 1 application topically as needed. 09/24/20   Heilingoetter, Cassandra L, PA-C  midodrine (PROAMATINE) 10 MG tablet Take 1 tablet (10 mg total) by mouth 3 (three) times daily with meals. 07/25/20   Eugenie Filler, MD  sennosides-docusate sodium (SENOKOT-S) 8.6-50 MG tablet Take 1 tablet by mouth daily. As needed    [provider]  sodium chloride 1 g tablet Take 2 tablets (2 g total) by mouth 3 (three) times daily with meals. 07/25/20   Eugenie Filler, MD  warfarin (COUMADIN) 7.5 MG tablet TAKE 1 TABLET BY MOUTH DAILY AT 6PM. Patient taking differently: Take 7.5 mg by mouth every evening. 01/20/20   Bensimhon, Shaune Pascal, MD     Family  History  Problem Relation Age of Onset  . Diabetes Mother   . Heart disease Mother   . Diabetes Father   . Prostate cancer Father   . Heart disease Father   . Diabetes Brother   . Diabetes Brother     Social History   Socioeconomic History  . Marital status: Single    Spouse name: Not on file  . Number of children: 1  . Years of education: 73  . Highest education level: Not on file  Occupational History  . Occupation: Disabled  Tobacco Use  . Smoking status: Former Smoker    Packs/day: 0.25    Years: 31.00    Pack years: 7.75    Types: Cigarettes  . Smokeless tobacco: Never Used  Vaping Use  . Vaping Use: Never used  Substance and Sexual Activity  . Alcohol use: Not Currently    Alcohol/week: 0.0 standard drinks    Comment: 09/21/2014 "might have a drink q 6 /months, if that"  . Drug use: Yes    Types: Cocaine    Comment: last use 07/06/2020  . Sexual activity: Yes  Other Topics Concern  . Not on file  Social History Narrative   Lives in  [place] with his mother. No pets. Fun: movies, sports, daughter.    Denies religious beliefs that would effect health care.    Social Determinants of Health   Financial Resource Strain: Not on file  Food Insecurity: Not on file  Transportation Needs: Not on file  Physical Activity: Not on file  Stress: Not on file  Social Connections: Not on file    Review of Systems: A 12 point ROS discussed and pertinent positives are indicated in the HPI above.  All other systems are negative.  Review of Systems  Constitutional: Negative for appetite change and fatigue.  Respiratory: Negative for cough and shortness of breath.   Cardiovascular: Positive for leg swelling. Negative for chest pain.  Gastrointestinal: Negative for abdominal distention, diarrhea, nausea and vomiting.  Genitourinary: Positive for difficulty urinating. Negative for flank pain.  Musculoskeletal: Negative for back pain.  Neurological: Negative for headaches.     Vital Signs: BP 112/81   Pulse 96   Temp (!) 97.3 F (36.3 C)   Resp 20   SpO2 100%   Physical Exam Constitutional:      General: He is not in acute distress. HENT:     Mouth/Throat:     Mouth: Mucous membranes are moist.     Pharynx: Oropharynx is clear.  Cardiovascular:     Rate and Rhythm: Normal rate and regular rhythm.     Pulses: Normal pulses.     Heart sounds: Normal heart sounds.     Comments: Left upper chest AICD; compression stockings on bilateral lower extremities.  Pulmonary:     Effort: Pulmonary effort is normal.     Breath sounds: Normal breath sounds.  Abdominal:     General: Bowel sounds are normal.     Palpations: Abdomen is soft.  Skin:    General: Skin is warm and dry.  Neurological:     Mental Status: He is alert and oriented to person, place, and time.  Psychiatric:        Mood and Affect: Mood normal.        Behavior: Behavior normal.        Thought Content: Thought content normal.        Judgment: Judgment normal.     Imaging: CT Chest W Contrast  Result Date: 09/18/2020 CLINICAL DATA:  Extensive stage small-cell lung cancer. Known brain metastasis. EXAM: CT CHEST, ABDOMEN, AND PELVIS WITH CONTRAST TECHNIQUE: Multidetector CT imaging of the chest, abdomen and pelvis was performed following the standard protocol during bolus administration of intravenous contrast. CONTRAST:  161m OMNIPAQUE IOHEXOL 300 MG/ML  SOLN COMPARISON:  04/23/2020 FINDINGS: CT CHEST FINDINGS Cardiovascular: Pacer. Aortic atherosclerosis. Tortuous thoracic aorta. Mild cardiomegaly with coronary artery atherosclerosis. No central pulmonary embolism, on this non-dedicated study. Mediastinum/Nodes: No supraclavicular adenopathy. No mediastinal or hilar adenopathy. Esophageal fluid level on 24/2. Lungs/Pleura: No pleural fluid. Moderate centrilobular and paraseptal emphysema. Right middle lobe pulmonary nodule measures 1.1 x 0.7 cm on 100/6 versus 1.3 x 0.6 cm on the prior  exam (when remeasured). This suggests stability. Left base scarring. Musculoskeletal: Moderate bilateral gynecomastia. No acute osseous abnormality. CT ABDOMEN PELVIS FINDINGS Hepatobiliary: Normal liver. Cholecystectomy, without biliary ductal dilatation. Pancreas: Normal, without mass or ductal dilatation. Spleen: Normal in size, without focal abnormality. Adrenals/Urinary Tract: Normal left adrenal gland. Right adrenal nodule of 2.6 cm has been characterized as an adenoma on 01/17/2020 PET. Normal left kidney. Right renal too small to characterize lesions. Mild lower pole right renal scarring. Punctate lower pole right  renal collecting system calculus. No hydronephrosis. Normal urinary bladder. Stomach/Bowel: Normal stomach, without wall thickening. Colonic stool burden suggests constipation. Normal terminal ileum and appendix. Normal small bowel. Vascular/Lymphatic: Aortic atherosclerosis. No abdominopelvic adenopathy. Reproductive: Normal prostate. Other: No significant free fluid. No evidence of omental or peritoneal disease. Musculoskeletal: Degenerative changes of both hips. Mild osteopenia. Remote trauma or surgery involving the left iliac crest. Right L5 pars defect with trace L5-S1 anterolisthesis. IMPRESSION: 1. Similar size of a right middle lobe pulmonary nodule. 2. No thoracic adenopathy. 3. No findings to suggest metastatic disease in the chest, abdomen, or pelvis. 4. Aortic atherosclerosis (ICD10-I70.0), coronary artery atherosclerosis and emphysema (ICD10-J43.9). 5. Esophageal air fluid level suggests dysmotility or gastroesophageal reflux. 6. Bilateral gynecomastia. 7. Right adrenal adenoma. 8. Right nephrolithiasis. Electronically Signed   By: Abigail Miyamoto M.D.   On: 09/18/2020 09:16   CT Abdomen Pelvis W Contrast  Result Date: 09/18/2020 CLINICAL DATA:  Extensive stage small-cell lung cancer. Known brain metastasis. EXAM: CT CHEST, ABDOMEN, AND PELVIS WITH CONTRAST TECHNIQUE: Multidetector  CT imaging of the chest, abdomen and pelvis was performed following the standard protocol during bolus administration of intravenous contrast. CONTRAST:  183m OMNIPAQUE IOHEXOL 300 MG/ML  SOLN COMPARISON:  04/23/2020 FINDINGS: CT CHEST FINDINGS Cardiovascular: Pacer. Aortic atherosclerosis. Tortuous thoracic aorta. Mild cardiomegaly with coronary artery atherosclerosis. No central pulmonary embolism, on this non-dedicated study. Mediastinum/Nodes: No supraclavicular adenopathy. No mediastinal or hilar adenopathy. Esophageal fluid level on 24/2. Lungs/Pleura: No pleural fluid. Moderate centrilobular and paraseptal emphysema. Right middle lobe pulmonary nodule measures 1.1 x 0.7 cm on 100/6 versus 1.3 x 0.6 cm on the prior exam (when remeasured). This suggests stability. Left base scarring. Musculoskeletal: Moderate bilateral gynecomastia. No acute osseous abnormality. CT ABDOMEN PELVIS FINDINGS Hepatobiliary: Normal liver. Cholecystectomy, without biliary ductal dilatation. Pancreas: Normal, without mass or ductal dilatation. Spleen: Normal in size, without focal abnormality. Adrenals/Urinary Tract: Normal left adrenal gland. Right adrenal nodule of 2.6 cm has been characterized as an adenoma on 01/17/2020 PET. Normal left kidney. Right renal too small to characterize lesions. Mild lower pole right renal scarring. Punctate lower pole right renal collecting system calculus. No hydronephrosis. Normal urinary bladder. Stomach/Bowel: Normal stomach, without wall thickening. Colonic stool burden suggests constipation. Normal terminal ileum and appendix. Normal small bowel. Vascular/Lymphatic: Aortic atherosclerosis. No abdominopelvic adenopathy. Reproductive: Normal prostate. Other: No significant free fluid. No evidence of omental or peritoneal disease. Musculoskeletal: Degenerative changes of both hips. Mild osteopenia. Remote trauma or surgery involving the left iliac crest. Right L5 pars defect with trace L5-S1  anterolisthesis. IMPRESSION: 1. Similar size of a right middle lobe pulmonary nodule. 2. No thoracic adenopathy. 3. No findings to suggest metastatic disease in the chest, abdomen, or pelvis. 4. Aortic atherosclerosis (ICD10-I70.0), coronary artery atherosclerosis and emphysema (ICD10-J43.9). 5. Esophageal air fluid level suggests dysmotility or gastroesophageal reflux. 6. Bilateral gynecomastia. 7. Right adrenal adenoma. 8. Right nephrolithiasis. Electronically Signed   By: KAbigail MiyamotoM.D.   On: 09/18/2020 09:16   CUP PACEART INCLINIC DEVICE CHECK  Result Date: 09/28/2020 ICD check in clinic. Device reached ERI on 08/17/20.  Thresholds and sensing consistent with previous device measurements. Impedance trends stable over time. 32 mode switch episodes, available EGMs reviewed, appears to be noise on Atrial channel, per Dr. TLovena Ledevice reprogrammed VVI 40bpm today.  No ventricular arrhythmias. Histogram distribution appropriate for patient and level of activity. Device programmed at appropriate safety margins. Device programmed to optimize intrinsic conduction. [facility]  to wait until after Cancer treatment to make a decision on generator change.  Pt enrolled in remote follow-up, will continue with remote monitoring with next scheduled check 10/01/20.Trena Platt, BSN, RN   Labs:  CBC: Recent Labs    07/22/20 0555 07/23/20 0513 09/17/20 1232 10/11/20 1036  WBC 15.4* 15.6* 14.0* 8.1  HGB 10.1* 10.4* 12.9* 12.4*  HCT 31.8* 33.4* 39.7 39.9  PLT 309 285 226 192    COAGS: Recent Labs    12/23/19 1056 12/24/19 0411 07/08/20 0542 07/09/20 1210 08/14/20 0940 08/29/20 0859 09/19/20 0957 10/04/20 1056  INR 2.4*   < > 1.1   < > 1.8* 2.6 1.5* 1.5*  APTT 45*  --  38*  --   --   --   --   --    < > = values in this interval not displayed.    BMP: Recent Labs    07/09/20 1039 07/10/20 0451 07/11/20 0509 07/16/20 0543 07/18/20 0435 07/21/20 0550  07/22/20 0555 07/23/20 0513 09/17/20 1232  NA 138 136 137 134*   < > 136 135 136 141  K 4.1 3.8 4.6 4.5   < > 4.4 4.5 4.5 4.2  CL 102 103 103 100   < > 99 100 99 107  CO2 '26 24 25 25   ' < > '28 27 29 28  ' GLUCOSE 139* 65* 97 305*   < > 158* 151* 164* 165*  BUN '17 18 18 ' 33*   < > 42* 38* 39* 29*  CALCIUM 8.7* 8.6* 9.1 9.6   < > 9.8 9.4 9.5 9.9  CREATININE 1.14 0.96 0.95 0.90   < > 0.94 0.91 0.91 1.40*  GFRNONAA >60 >60 >60 >60   < > >60 >60 >60 58*  GFRAA >60 >60 >60 >60  --   --   --   --   --    < > = values in this interval not displayed.    LIVER FUNCTION TESTS: Recent Labs    06/20/20 1051 07/07/20 1315 07/08/20 0542 07/16/20 0543 09/17/20 1232  BILITOT 0.3 0.8 0.7  --  0.3  AST 10* 20 18  --  20  ALT '8 17 16  ' --  39  ALKPHOS 121 88 83  --  121  PROT 6.7 7.4 6.8  --  7.0  ALBUMIN 3.3* 3.2* 3.0* 2.9* 3.1*    TUMOR MARKERS: No results for input(s): AFPTM, CEA, CA199, CHROMGRNA in the last 8760 hours.  Assessment and Plan:  Small cell carcinoma of the lung with metastases; poor venous access: Dennis Starr., 59 year old male, presents today to the [facility] department for an image-guided port-a-catheter placement for reliable chemotherapy access.  Risks and benefits of an image-guided port-a-catheter placement were discussed with the patient including, but not limited to bleeding, infection, pneumothorax, or fibrin sheath development and need for additional procedures.  All of the patient's questions were answered, patient is agreeable to proceed.  He has been NPO. Labs and vitals have been reviewed. He takes coumadin daily.   Consent signed and in chart.  Thank you for this interesting consult.  I greatly enjoyed meeting Dennis [name]. and look forward to participating in their care.  A copy of this report was sent to the requesting provider on this date.  Electronically Signed: Soyla Dryer, AGACNP-BC [phone] 10/11/2020, 10:52  AM   I spent a total of  30 Minutes   in face to face in clinical consultation, greater than  50% of which was counseling/coordinating care for port-a-catheter placement.

## 2020-10-11 NOTE — Progress Notes (Signed)
Patient returns from IR very sleepy and with orders for insulin coverage and Hgb A1C to be drawn. 11 units novalog insulin given for blood sugar of 317. He is slow to wake up. More alert at 1215. Drinking diet gingerale and eating graham crackers with peanut butter. Delayed discharge from 1 PM to 1:45 PM until patient more alert.

## 2020-10-11 NOTE — Discharge Instructions (Signed)
You have skin glue (dermabond) over your new port. Do not use the lidocaine cream (EMLA cream) over the skin glue until it has healed. The petroleum in the lidocaine cream will dissolve the skin glue resulting in an infection of your new port. Use ice in a zip lock bag for 1-2 minutes over your new port before the cancer center nurses access your port.  Interventional radiology phone numbers [phone] After hours [phone]  Implanted Port Insertion, Care After This sheet gives you information about how to care for yourself after your procedure. Your health care provider may also give you more specific instructions. If you have problems or questions, contact your health care provider. What can I expect after the procedure? After the procedure, it is common to have:  Discomfort at the port insertion site.  Bruising on the skin over the port. This should improve over 3-4 days. Follow these instructions at home: [facility] care  After your port is placed, you will get a manufacturer's information card. The card has information about your port. Keep this card with you at all times.  Take care of the port as told by your health care provider.   Make sure to remember what type of port you have. Incision care  Follow instructions from your health care provider about how to take care of your port insertion site. Make sure you:  Leave skin glue in place. These skin closures may need to stay in place for 2 weeks or longer.  Check your port insertion site every day for signs of infection.   Check for: ? Redness, swelling, or pain. ? Fluid or blood. ? Warmth. ? Pus or a bad smell. Activity  Return to your normal activities as told by your health care provider. Ask your health care provider what activities are safe for you.  Do not lift anything that is heavier than 10 lb (4.5 kg), or the limit that you are told, until your health care provider says that it is safe. General  instructions  Take over-the-counter and prescription medicines only as told by your health care provider.  Do not take baths, swim, or use a hot tub until your health care provider approves. You may remove the dressing tomorrow around 1200 and shower.  Do not drive for 24 hours if you were given a sedative during your procedure.  Wear a medical alert bracelet in case of an emergency. This will tell any health care providers that you have a port.  Keep all follow-up visits as told by your health care provider. This is important. Contact a health care provider if:  You cannot flush your port with saline as directed, or you cannot draw blood from the port.  You have a fever or chills.  You have redness, swelling, or pain around your port insertion site.  You have fluid or blood coming from your port insertion site.  Your port insertion site feels warm to the touch.  You have pus or a bad smell coming from the port insertion site. Get help right away if:  You have chest pain or shortness of breath.  You have bleeding from your port that you cannot control. Summary  Take care of the port as told by your health care provider. Keep the manufacturer's information card with you at all times.  Change your dressing as told by your health care provider.  Contact a health care provider if you have a fever or chills or if you have redness, swelling,  or pain around your port insertion site.  Keep all follow-up visits as told by your health care provider. This information is not intended to replace advice given to you by your health care provider. Make sure you discuss any questions you have with your health care provider. Document Revised: 04/27/2018 Document Reviewed: 04/27/2018 Elsevier Patient Education  [place].     Moderate Conscious Sedation, Adult, Care After These instructions provide you with information about caring for yourself after your procedure. Your health  care provider may also give you more specific instructions. Your treatment has been planned according to current medical practices, but problems sometimes occur. Call your health care provider if you have any problems or questions after your procedure. What can I expect after the procedure? After your procedure, it is common:  To feel sleepy for several hours.  To feel clumsy and have poor balance for several hours.  To have poor judgment for several hours.  To vomit if you eat too soon. Follow these instructions at home: For at least 24 hours after the procedure:   Do not: ? Participate in activities where you could fall or become injured. ? Drive. ? Use heavy machinery. ? Drink alcohol. ? Take sleeping pills or medicines that cause drowsiness. ? Make important decisions or sign legal documents. ? Take care of children on your own.  Rest. Eating and drinking  Follow the diet recommended by your health care provider.  If you vomit: ? Drink water, juice, or soup when you can drink without vomiting. ? Make sure you have little or no nausea before eating solid foods. General instructions  Have a responsible adult stay with you until you are awake and alert.  Take over-the-counter and prescription medicines only as told by your health care provider.  If you smoke, do not smoke without supervision.  Keep all follow-up visits as told by your health care provider. This is important. Contact a health care provider if:  You keep feeling nauseous or you keep vomiting.  You feel light-headed.  You develop a rash.  You have a fever. Get help right away if:  You have trouble breathing. This information is not intended to replace advice given to you by your health care provider. Make sure you discuss any questions you have with your health care provider. Document Revised: 09/11/2017 Document Reviewed: 01/19/2016 Elsevier Patient Education  2020 [employer].

## 2020-10-15 ENCOUNTER — Other Ambulatory Visit: Payer: Medicare Other

## 2020-10-15 ENCOUNTER — Ambulatory Visit: Payer: Medicare Other | Admitting: Internal Medicine

## 2020-10-15 ENCOUNTER — Ambulatory Visit: Payer: Medicare Other

## 2020-10-17 ENCOUNTER — Other Ambulatory Visit: Payer: Self-pay

## 2020-10-17 ENCOUNTER — Ambulatory Visit (INDEPENDENT_AMBULATORY_CARE_PROVIDER_SITE_OTHER): Payer: Medicare Other | Admitting: *Deleted

## 2020-10-17 DIAGNOSIS — I428 Other cardiomyopathies: Secondary | ICD-10-CM

## 2020-10-17 DIAGNOSIS — Z7901 Long term (current) use of anticoagulants: Secondary | ICD-10-CM | POA: Diagnosis not present

## 2020-10-17 LAB — POCT INR: INR: 2.9 (ref 2.0–3.0)

## 2020-10-17 NOTE — Patient Instructions (Signed)
Description   Continue taking Warfarin 1/2 tablet daily except 1 tablet on Tuesdays and Thursdays. Recheck in 2 weeks.  Main # [id number] Coumadin Clinic # [phone].

## 2020-10-24 ENCOUNTER — Telehealth: Payer: Self-pay | Admitting: *Deleted

## 2020-10-24 NOTE — Telephone Encounter (Signed)
Received call from patient. He is asking when he should put the cream on his port.  Reviewed his schedule with him and advised that he will need to use the cream on 11/12/20 for his 11am appt as he will be getting labs and an imfinzi treatment that day. Reviewed with him how to use the cream. He voiced understanding.

## 2020-10-25 ENCOUNTER — Encounter (HOSPITAL_COMMUNITY): Payer: Self-pay

## 2020-10-26 ENCOUNTER — Ambulatory Visit (HOSPITAL_COMMUNITY): Payer: Medicare Other

## 2020-10-29 ENCOUNTER — Telehealth: Payer: Self-pay | Admitting: Internal Medicine

## 2020-10-29 NOTE — Telephone Encounter (Signed)
Rescheduled 01/18 appointment to 01/28 due to patient not getting MRI and provider orders.

## 2020-10-30 ENCOUNTER — Emergency Department (HOSPITAL_COMMUNITY): Payer: Medicare Other

## 2020-10-30 ENCOUNTER — Inpatient Hospital Stay (HOSPITAL_COMMUNITY)
Admission: EM | Admit: 2020-10-30 | Discharge: 2020-11-28 | DRG: 480 | Disposition: A | Discharge status: Home Health | Payer: Medicare Other | Attending: Internal Medicine | Admitting: Internal Medicine

## 2020-10-30 ENCOUNTER — Other Ambulatory Visit: Payer: Self-pay

## 2020-10-30 ENCOUNTER — Inpatient Hospital Stay: Payer: Medicare Other | Admitting: Internal Medicine

## 2020-10-30 DIAGNOSIS — S72402A Unspecified fracture of lower end of left femur, initial encounter for closed fracture: Secondary | ICD-10-CM

## 2020-10-30 DIAGNOSIS — D62 Acute posthemorrhagic anemia: Secondary | ICD-10-CM | POA: Diagnosis not present

## 2020-10-30 DIAGNOSIS — Z419 Encounter for procedure for purposes other than remedying health state, unspecified: Secondary | ICD-10-CM

## 2020-10-30 DIAGNOSIS — Z7952 Long term (current) use of systemic steroids: Secondary | ICD-10-CM | POA: Diagnosis not present

## 2020-10-30 DIAGNOSIS — Z87891 Personal history of nicotine dependence: Secondary | ICD-10-CM

## 2020-10-30 DIAGNOSIS — T148XXA Other injury of unspecified body region, initial encounter: Secondary | ICD-10-CM

## 2020-10-30 DIAGNOSIS — U071 COVID-19: Secondary | ICD-10-CM | POA: Diagnosis present

## 2020-10-30 DIAGNOSIS — W19XXXA Unspecified fall, initial encounter: Secondary | ICD-10-CM | POA: Diagnosis present

## 2020-10-30 DIAGNOSIS — I5022 Chronic systolic (congestive) heart failure: Secondary | ICD-10-CM | POA: Diagnosis present

## 2020-10-30 DIAGNOSIS — F32A Depression, unspecified: Secondary | ICD-10-CM | POA: Diagnosis present

## 2020-10-30 DIAGNOSIS — Z794 Long term (current) use of insulin: Secondary | ICD-10-CM

## 2020-10-30 DIAGNOSIS — N183 Chronic kidney disease, stage 3 unspecified: Secondary | ICD-10-CM | POA: Diagnosis present

## 2020-10-30 DIAGNOSIS — Z8249 Family history of ischemic heart disease and other diseases of the circulatory system: Secondary | ICD-10-CM

## 2020-10-30 DIAGNOSIS — Z8042 Family history of malignant neoplasm of prostate: Secondary | ICD-10-CM

## 2020-10-30 DIAGNOSIS — K5900 Constipation, unspecified: Secondary | ICD-10-CM | POA: Diagnosis not present

## 2020-10-30 DIAGNOSIS — I951 Orthostatic hypotension: Secondary | ICD-10-CM | POA: Diagnosis present

## 2020-10-30 DIAGNOSIS — I130 Hypertensive heart and chronic kidney disease with heart failure and stage 1 through stage 4 chronic kidney disease, or unspecified chronic kidney disease: Secondary | ICD-10-CM | POA: Diagnosis present

## 2020-10-30 DIAGNOSIS — E1165 Type 2 diabetes mellitus with hyperglycemia: Secondary | ICD-10-CM | POA: Diagnosis present

## 2020-10-30 DIAGNOSIS — C7931 Secondary malignant neoplasm of brain: Secondary | ICD-10-CM | POA: Diagnosis present

## 2020-10-30 DIAGNOSIS — R791 Abnormal coagulation profile: Secondary | ICD-10-CM | POA: Diagnosis present

## 2020-10-30 DIAGNOSIS — J9601 Acute respiratory failure with hypoxia: Secondary | ICD-10-CM | POA: Diagnosis present

## 2020-10-30 DIAGNOSIS — T380X5A Adverse effect of glucocorticoids and synthetic analogues, initial encounter: Secondary | ICD-10-CM | POA: Diagnosis not present

## 2020-10-30 DIAGNOSIS — Z9049 Acquired absence of other specified parts of digestive tract: Secondary | ICD-10-CM | POA: Diagnosis not present

## 2020-10-30 DIAGNOSIS — F1410 Cocaine abuse, uncomplicated: Secondary | ICD-10-CM | POA: Diagnosis present

## 2020-10-30 DIAGNOSIS — I6930 Unspecified sequelae of cerebral infarction: Secondary | ICD-10-CM

## 2020-10-30 DIAGNOSIS — S72402B Unspecified fracture of lower end of left femur, initial encounter for open fracture type I or II: Secondary | ICD-10-CM

## 2020-10-30 DIAGNOSIS — Y9301 Activity, walking, marching and hiking: Secondary | ICD-10-CM | POA: Diagnosis present

## 2020-10-30 DIAGNOSIS — Z7901 Long term (current) use of anticoagulants: Secondary | ICD-10-CM

## 2020-10-30 DIAGNOSIS — S72409A Unspecified fracture of lower end of unspecified femur, initial encounter for closed fracture: Secondary | ICD-10-CM | POA: Diagnosis present

## 2020-10-30 DIAGNOSIS — W000XXA Fall on same level due to ice and snow, initial encounter: Secondary | ICD-10-CM | POA: Diagnosis present

## 2020-10-30 DIAGNOSIS — Z95810 Presence of automatic (implantable) cardiac defibrillator: Secondary | ICD-10-CM | POA: Diagnosis present

## 2020-10-30 DIAGNOSIS — R1310 Dysphagia, unspecified: Secondary | ICD-10-CM | POA: Diagnosis not present

## 2020-10-30 DIAGNOSIS — I428 Other cardiomyopathies: Secondary | ICD-10-CM | POA: Diagnosis present

## 2020-10-30 DIAGNOSIS — S72342A Displaced spiral fracture of shaft of left femur, initial encounter for closed fracture: Secondary | ICD-10-CM | POA: Diagnosis present

## 2020-10-30 DIAGNOSIS — D630 Anemia in neoplastic disease: Secondary | ICD-10-CM | POA: Diagnosis present

## 2020-10-30 DIAGNOSIS — E11649 Type 2 diabetes mellitus with hypoglycemia without coma: Secondary | ICD-10-CM | POA: Diagnosis not present

## 2020-10-30 DIAGNOSIS — Z833 Family history of diabetes mellitus: Secondary | ICD-10-CM | POA: Diagnosis not present

## 2020-10-30 DIAGNOSIS — Z79899 Other long term (current) drug therapy: Secondary | ICD-10-CM

## 2020-10-30 DIAGNOSIS — I69354 Hemiplegia and hemiparesis following cerebral infarction affecting left non-dominant side: Secondary | ICD-10-CM

## 2020-10-30 DIAGNOSIS — D72829 Elevated white blood cell count, unspecified: Secondary | ICD-10-CM | POA: Diagnosis not present

## 2020-10-30 DIAGNOSIS — Y92239 Unspecified place in hospital as the place of occurrence of the external cause: Secondary | ICD-10-CM | POA: Diagnosis not present

## 2020-10-30 DIAGNOSIS — E7800 Pure hypercholesterolemia, unspecified: Secondary | ICD-10-CM | POA: Diagnosis present

## 2020-10-30 DIAGNOSIS — R1312 Dysphagia, oropharyngeal phase: Secondary | ICD-10-CM | POA: Diagnosis present

## 2020-10-30 DIAGNOSIS — E785 Hyperlipidemia, unspecified: Secondary | ICD-10-CM | POA: Diagnosis present

## 2020-10-30 DIAGNOSIS — I5042 Chronic combined systolic (congestive) and diastolic (congestive) heart failure: Secondary | ICD-10-CM | POA: Diagnosis present

## 2020-10-30 DIAGNOSIS — G4730 Sleep apnea, unspecified: Secondary | ICD-10-CM | POA: Diagnosis present

## 2020-10-30 DIAGNOSIS — I2510 Atherosclerotic heart disease of native coronary artery without angina pectoris: Secondary | ICD-10-CM | POA: Diagnosis present

## 2020-10-30 DIAGNOSIS — E1122 Type 2 diabetes mellitus with diabetic chronic kidney disease: Secondary | ICD-10-CM | POA: Diagnosis present

## 2020-10-30 DIAGNOSIS — N179 Acute kidney failure, unspecified: Secondary | ICD-10-CM | POA: Diagnosis not present

## 2020-10-30 DIAGNOSIS — I69391 Dysphagia following cerebral infarction: Secondary | ICD-10-CM

## 2020-10-30 DIAGNOSIS — Z85118 Personal history of other malignant neoplasm of bronchus and lung: Secondary | ICD-10-CM

## 2020-10-30 DIAGNOSIS — I2582 Chronic total occlusion of coronary artery: Secondary | ICD-10-CM | POA: Diagnosis present

## 2020-10-30 DIAGNOSIS — Z5901 Sheltered homelessness: Secondary | ICD-10-CM

## 2020-10-30 LAB — COMPREHENSIVE METABOLIC PANEL
ALT: 25 U/L (ref 0–44)
AST: 33 U/L (ref 15–41)
Albumin: 2.8 g/dL — ABNORMAL LOW (ref 3.5–5.0)
Alkaline Phosphatase: 95 U/L (ref 38–126)
Anion gap: 10 (ref 5–15)
BUN: 19 mg/dL (ref 6–20)
CO2: 22 mmol/L (ref 22–32)
Calcium: 8.9 mg/dL (ref 8.9–10.3)
Chloride: 109 mmol/L (ref 98–111)
Creatinine, Ser: 1.41 mg/dL — ABNORMAL HIGH (ref 0.61–1.24)
GFR, Estimated: 58 mL/min — ABNORMAL LOW (ref >60)
Glucose, Bld: 294 mg/dL — ABNORMAL HIGH (ref 70–99)
Potassium: 4.4 mmol/L (ref 3.5–5.1)
Sodium: 141 mmol/L (ref 135–145)
Total Bilirubin: 1 mg/dL (ref 0.3–1.2)
Total Protein: 5.8 g/dL — ABNORMAL LOW (ref 6.5–8.1)

## 2020-10-30 LAB — RAPID URINE DRUG SCREEN, HOSP PERFORMED
Amphetamines: NOT DETECTED
Barbiturates: NOT DETECTED
Benzodiazepines: NOT DETECTED
Cocaine: POSITIVE — AB
Opiates: NOT DETECTED
Tetrahydrocannabinol: NOT DETECTED

## 2020-10-30 LAB — CBC
HCT: 39.5 % (ref 39.0–52.0)
Hemoglobin: 11.9 g/dL — ABNORMAL LOW (ref 13.0–17.0)
MCH: 29.7 pg (ref 26.0–34.0)
MCHC: 30.1 g/dL (ref 30.0–36.0)
MCV: 98.5 fL (ref 80.0–100.0)
Platelets: 198 10*3/uL (ref 150–400)
RBC: 4.01 MIL/uL — ABNORMAL LOW (ref 4.22–5.81)
RDW: 14.3 % (ref 11.5–15.5)
WBC: 10.3 10*3/uL (ref 4.0–10.5)
nRBC: 0 % (ref 0.0–0.2)

## 2020-10-30 LAB — URINALYSIS, ROUTINE W REFLEX MICROSCOPIC
Bilirubin Urine: NEGATIVE
Glucose, UA: >=500 mg/dL — AB
Ketones, ur: NEGATIVE mg/dL
Leukocytes,Ua: NEGATIVE
Nitrite: NEGATIVE
Protein, ur: 100 mg/dL — AB
Specific Gravity, Urine: 1.018 (ref 1.005–1.030)
pH: 5 (ref 5.0–8.0)

## 2020-10-30 LAB — RESP PANEL BY RT-PCR (FLU A&B, COVID) ARPGX2
Influenza A by PCR: NEGATIVE
Influenza B by PCR: NEGATIVE
SARS Coronavirus 2 by RT PCR: POSITIVE — AB

## 2020-10-30 LAB — C-REACTIVE PROTEIN: CRP: 0.6 mg/dL (ref <1.0)

## 2020-10-30 LAB — PROTIME-INR
INR: 1.9 — ABNORMAL HIGH (ref 0.8–1.2)
Prothrombin Time: 21.1 seconds — ABNORMAL HIGH (ref 11.4–15.2)

## 2020-10-30 LAB — FERRITIN: Ferritin: 146 ng/mL (ref 24–336)

## 2020-10-30 LAB — BRAIN NATRIURETIC PEPTIDE: B Natriuretic Peptide: 795.1 pg/mL — ABNORMAL HIGH (ref 0.0–100.0)

## 2020-10-30 LAB — SURGICAL PCR SCREEN
MRSA, PCR: NEGATIVE
Staphylococcus aureus: NEGATIVE

## 2020-10-30 LAB — FIBRINOGEN: Fibrinogen: 467 mg/dL (ref 210–475)

## 2020-10-30 LAB — TYPE AND SCREEN
ABO/RH(D): A POS
Antibody Screen: NEGATIVE

## 2020-10-30 LAB — HEPATITIS B SURFACE ANTIGEN: Hepatitis B Surface Ag: NONREACTIVE

## 2020-10-30 LAB — GLUCOSE, CAPILLARY
Glucose-Capillary: 189 mg/dL — ABNORMAL HIGH (ref 70–99)
Glucose-Capillary: 249 mg/dL — ABNORMAL HIGH (ref 70–99)

## 2020-10-30 LAB — LACTATE DEHYDROGENASE: LDH: 295 U/L — ABNORMAL HIGH (ref 98–192)

## 2020-10-30 LAB — D-DIMER, QUANTITATIVE: D-Dimer, Quant: 5.4 ug/mL-FEU — ABNORMAL HIGH (ref 0.00–0.50)

## 2020-10-30 LAB — PROCALCITONIN: Procalcitonin: <0.1 ng/mL

## 2020-10-30 IMAGING — DX DG CHEST 1V PORT
1 series · 2 of 2 positions shown · non-contrast
Comparison: CT [DATE].  Chest x-ray [DATE].
COMPARISON: CT [DATE].  Chest x-ray [DATE].

Addendum:
CLINICAL DATA: Fall.

EXAM:
PORTABLE CHEST 1 VIEW

[Series 1: chest ap · 0.14mm/px · 2 of 2 slices shown]
[im 1/2]
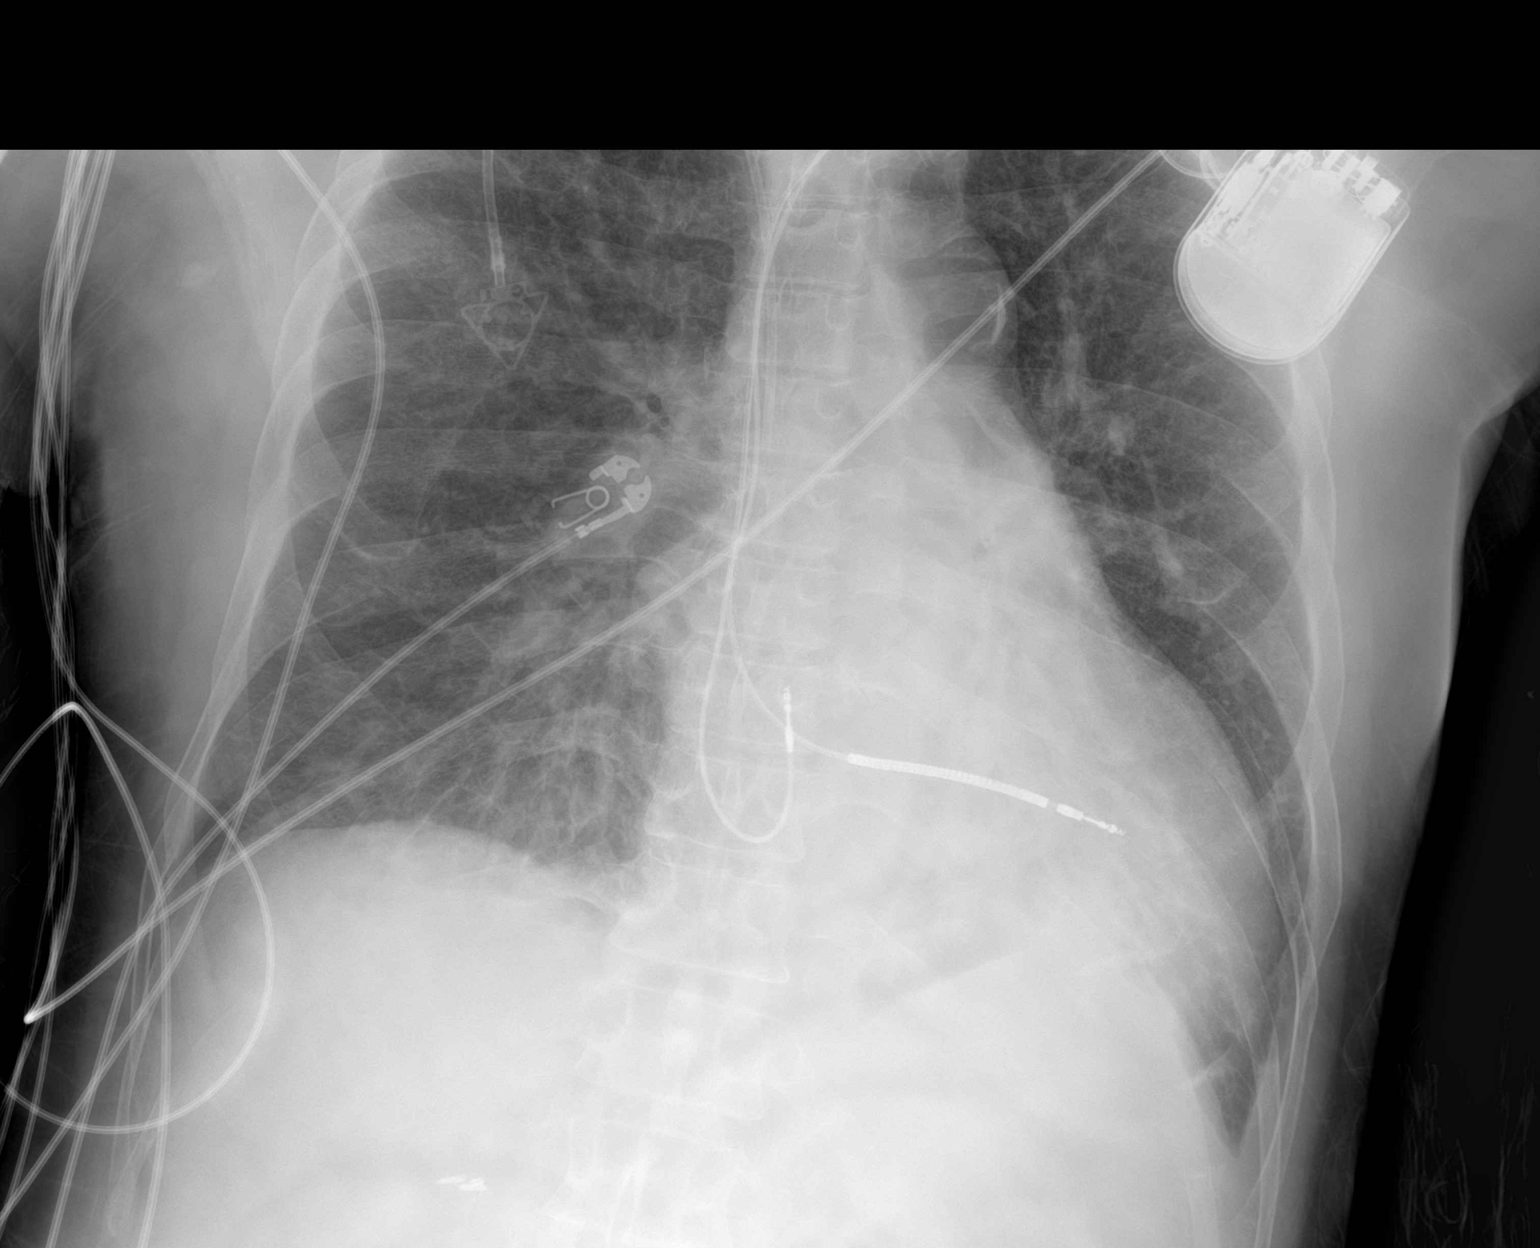
[im 2/2]
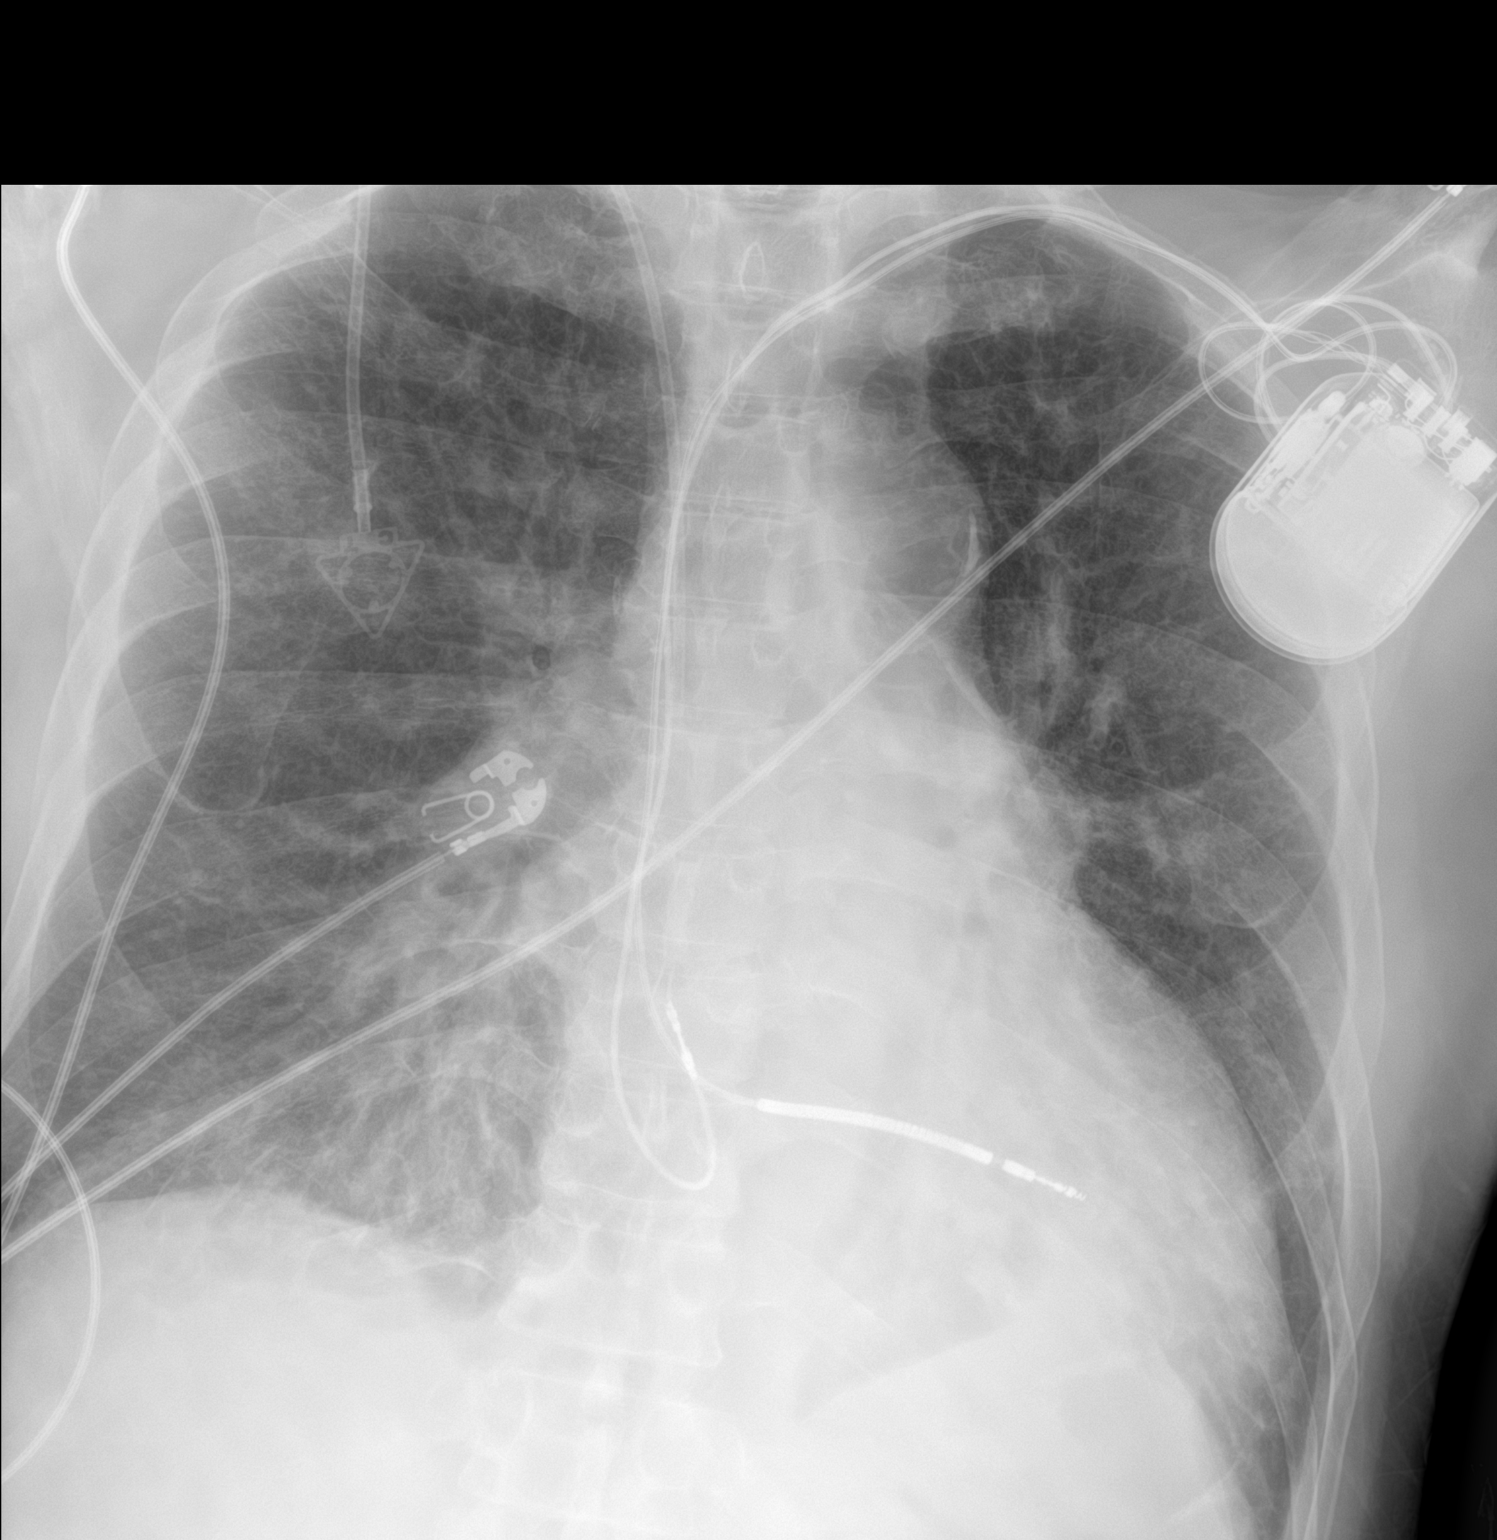

[2 of 2 positions shown; findings below may reference images not displayed]

FINDINGS: Port-A-Cath noted with lead tip over the right ventricle. Cardiac
pacer with lead tips over the right atrium right ventricle.
Cardiomegaly. Diffuse bilateral interstitial prominence. Small left
pleural effusion. Findings most consistent CHF. Pneumonitis cannot
be excluded.
IMPRESSION: 1.  Port-A-Cath noted with lead tip over the right ventricle.

2. Cardiac pacer with lead tips over the right atrium and right
ventricle. Cardiomegaly with diffuse bilateral interstitial
prominence and small left pleural effusion.

ADDENDUM:
The above chest x-ray report should read: Port-A-Cath noted with
lead tip in good anatomic position over the superior vena cava.

*** End of Addendum ***
FINDINGS: Port-A-Cath noted with lead tip over the right ventricle. Cardiac
pacer with lead tips over the right atrium right ventricle.
Cardiomegaly. Diffuse bilateral interstitial prominence. Small left
pleural effusion. Findings most consistent CHF. Pneumonitis cannot
be excluded.
IMPRESSION: 1.  Port-A-Cath noted with lead tip over the right ventricle.

2. Cardiac pacer with lead tips over the right atrium and right
ventricle. Cardiomegaly with diffuse bilateral interstitial
prominence and small left pleural effusion.

## 2020-10-30 IMAGING — DX DG PORTABLE PELVIS
1 series · 1 of 1 positions shown · non-contrast
Comparison: CT PET [DATE]

CLINICAL DATA: Fall

EXAM:
PORTABLE PELVIS 1-2 VIEWS

[pelvis ap]
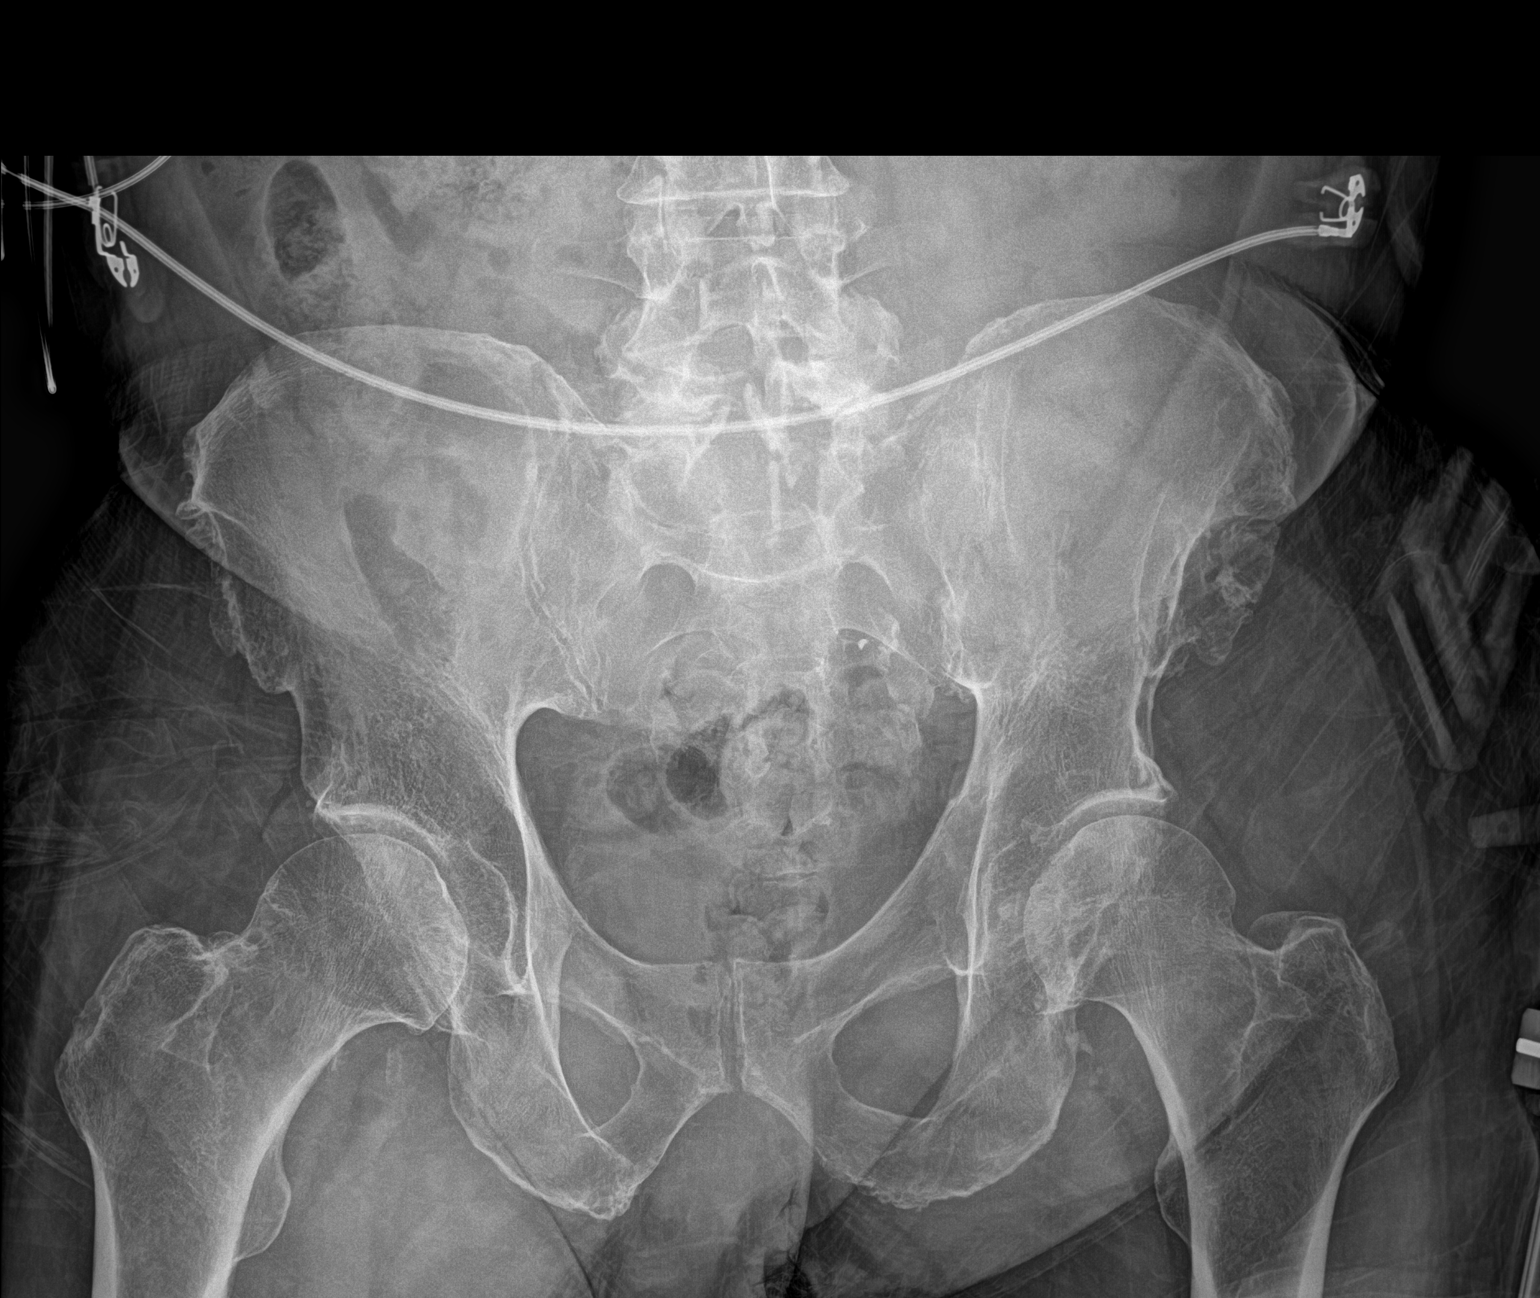

[1 of 1 positions shown; findings below may reference images not displayed]

FINDINGS: Degenerative changes about the bilateral hips.

Irregularity along the iliopectineal line on the LEFT.  Osteopenia.

No displaced fractures.
IMPRESSION: 1. Irregularity along the LEFT iliopectineal line, suspicious for
nondisplaced fracture about the LEFT acetabulum.
2. Degenerative changes about the hips.

## 2020-10-30 IMAGING — DX DG TIBIA/FIBULA PORT 2V*L*
4 series · 4 of 4 positions shown · non-contrast
Comparison: Femoral and pelvic evaluation of the same date.

CLINICAL DATA: Fall, deformity of the knee.

EXAM:
PORTABLE LEFT TIBIA AND FIBULA - 2 VIEW

[tibia ap (1 of 2)]
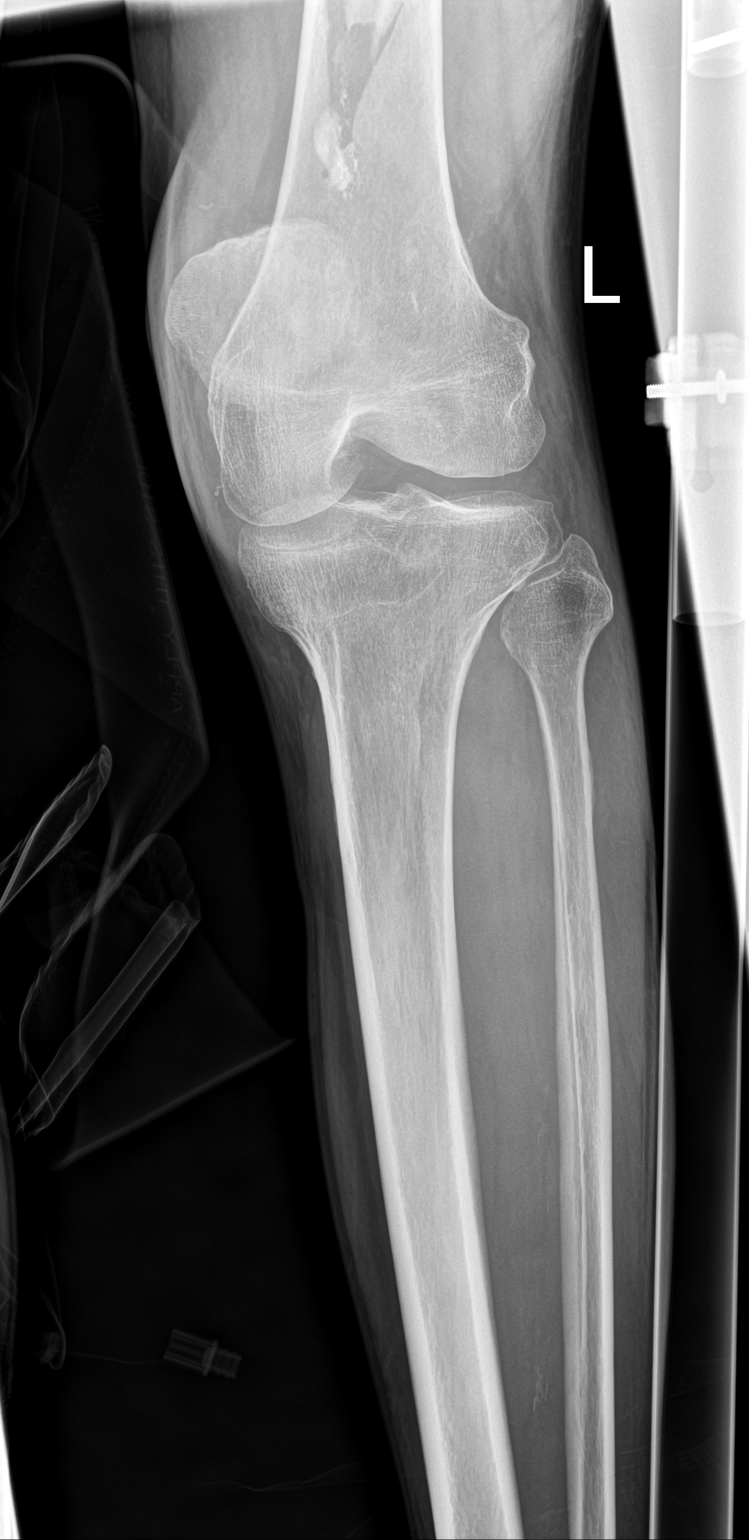

[tibia ap (2 of 2)]
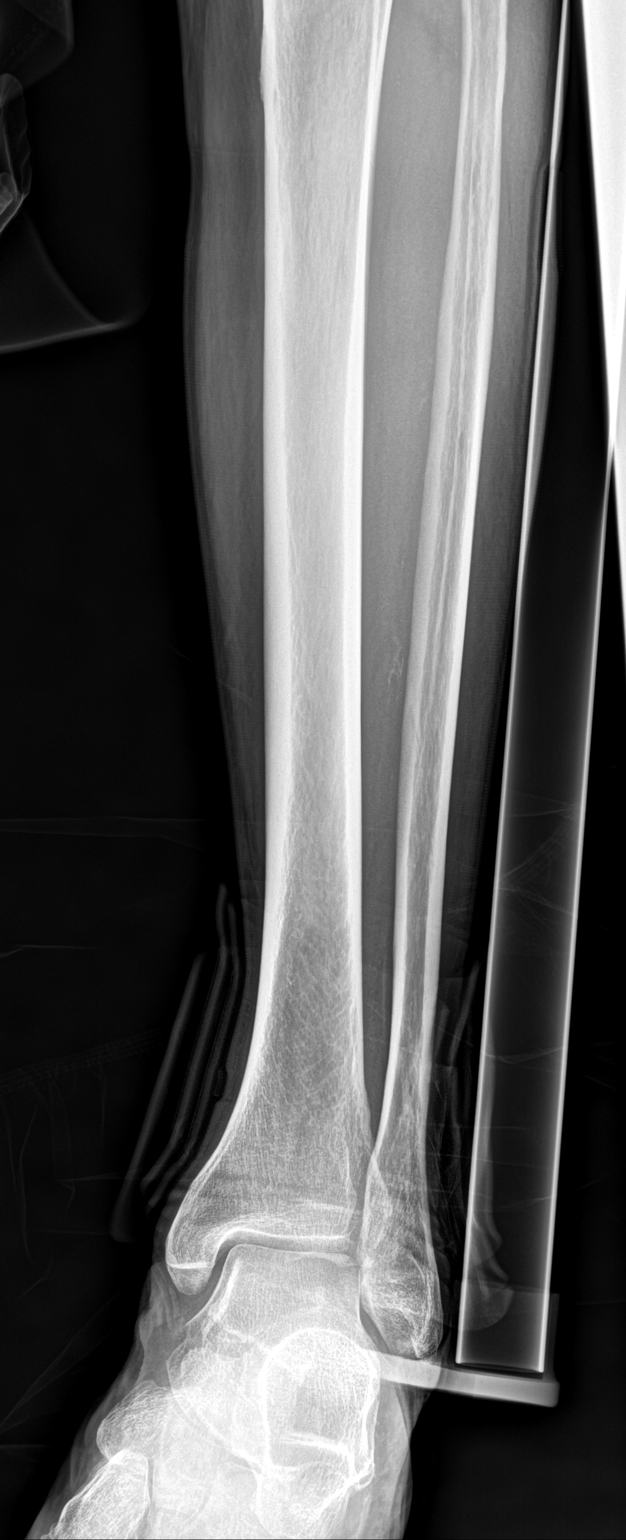

[tibia lat (1 of 2)]
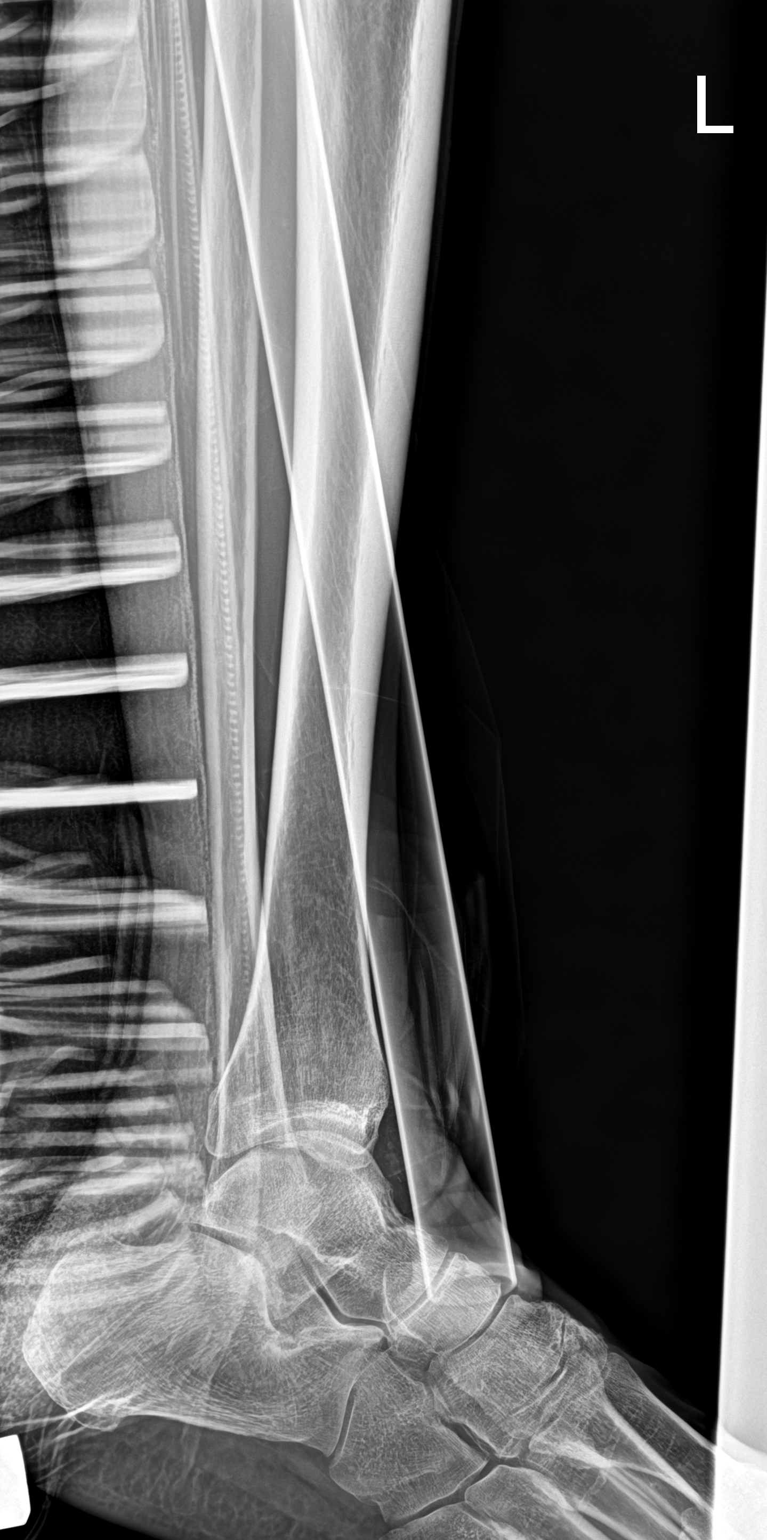

[tibia lat (2 of 2)]
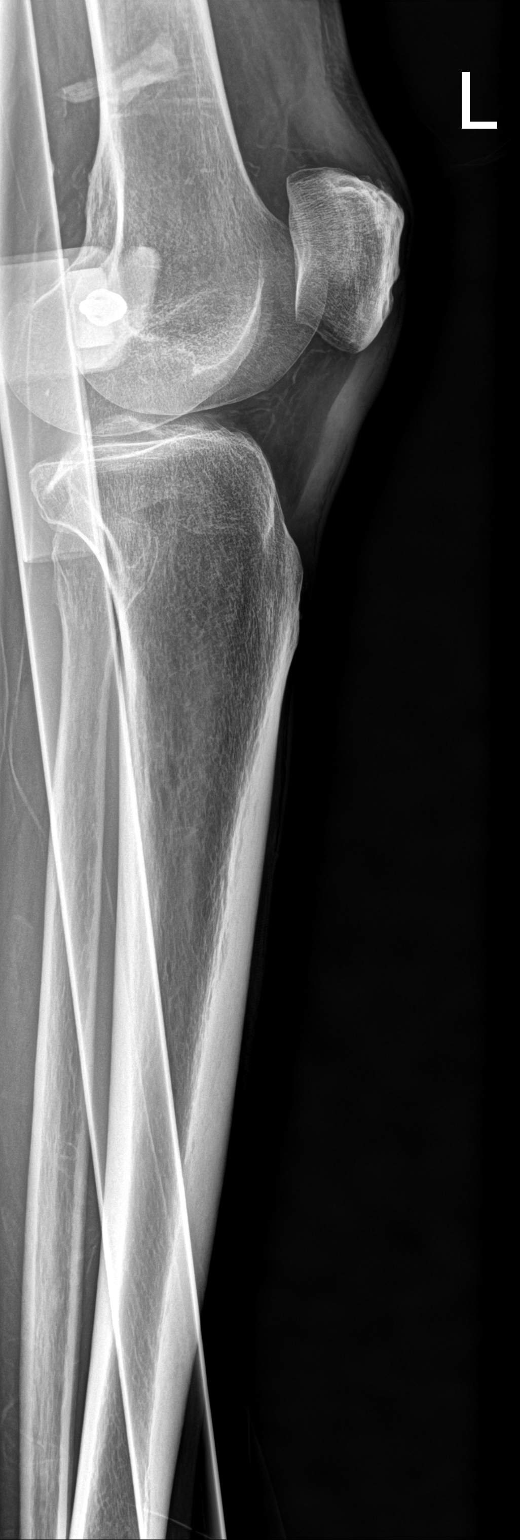

[4 of 4 positions shown; findings below may reference images not displayed]

FINDINGS: Overlapping stabilization device limiting assessment.

No signs of fracture of the tibia or fibula. No joint effusion noted
incidentally in the knee.

Incidental note made of mid to distal femoral fracture at the upper
margin of the submitted radiographs.

Question of medial patellar subluxation versus is artifact from
oblique nature of the submitted image. This could also be related to
the severity of the fracture in the femur above.
IMPRESSION: 1. No signs of fracture of the tibia or fibula.
2. Incidental note is made of mid to distal femoral fracture at the
upper margin of the submitted radiographs.
3. Question medial patellar subluxation versus artifact from oblique
nature of the submitted image.

## 2020-10-30 IMAGING — DX DG FEMUR 1V PORT*L*
4 series · 4 of 4 positions shown · non-contrast
Comparison: None

CLINICAL DATA: Fall, deformity about the knee.

EXAM:
LEFT FEMUR PORTABLE 1 VIEW

[femur ap proximal (1 of 2)]
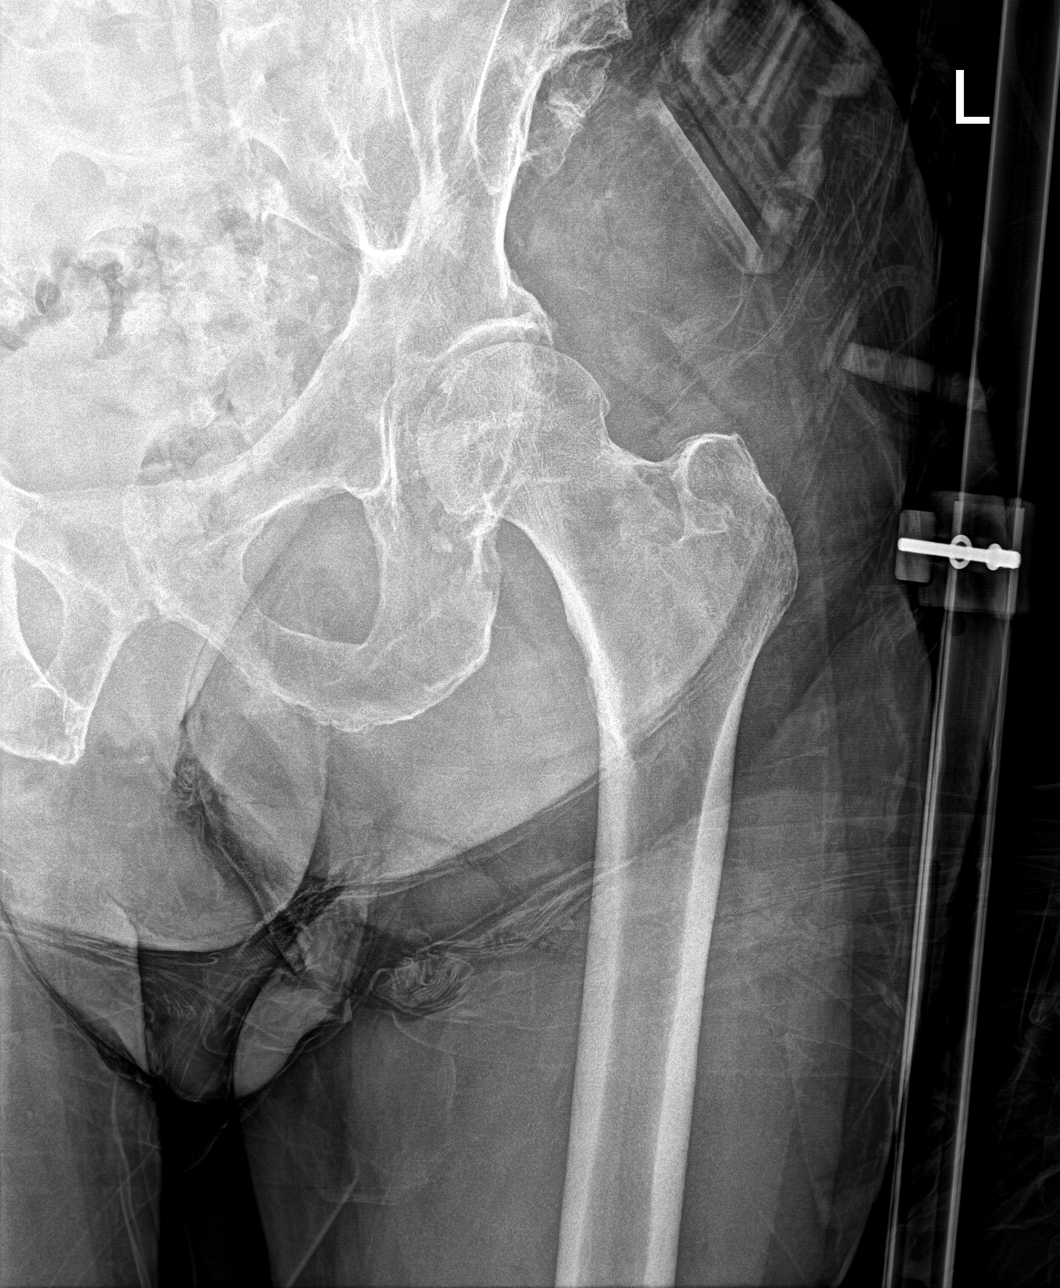

[femur ap proximal (2 of 2)]
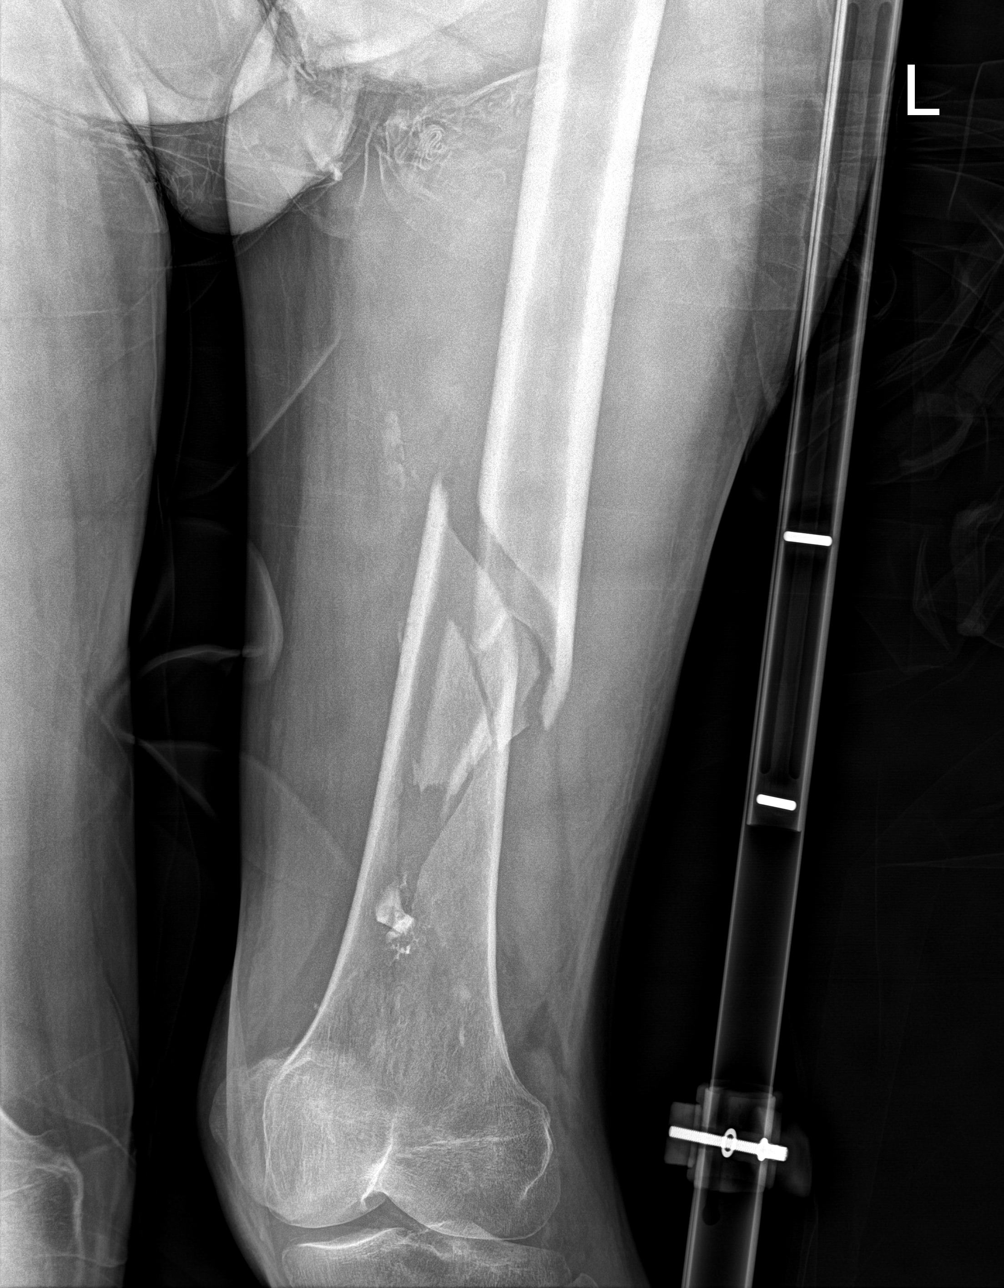

[femur lat (1 of 2)]
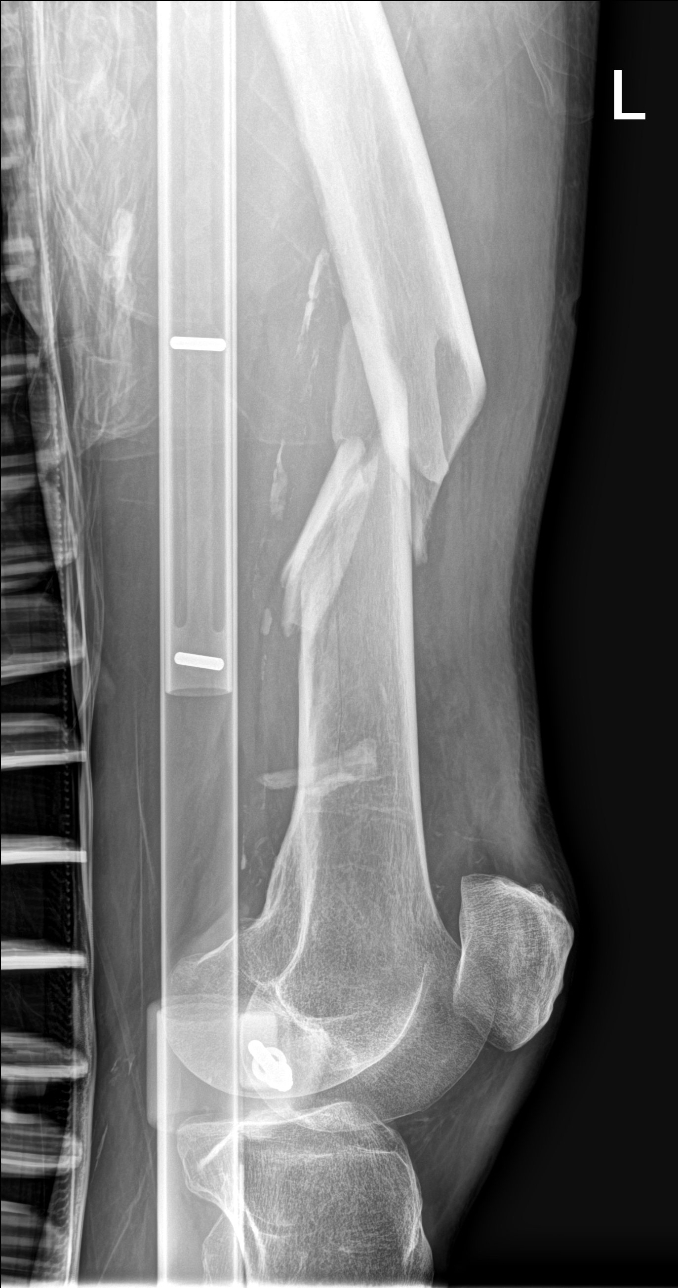

[femur lat (2 of 2)]
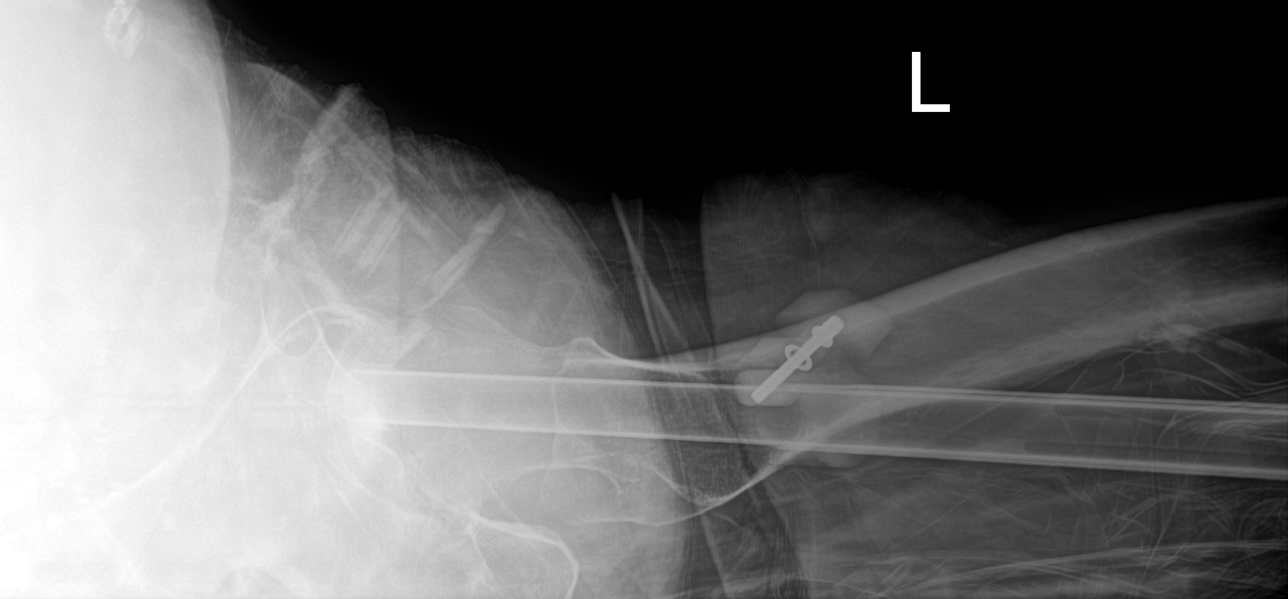

[4 of 4 positions shown; findings below may reference images not displayed]

FINDINGS: Distal femoral diaphyseal spiral fracture with butterfly fragment
and a half shaft with medial displacement of the distal fracture
fragment, mild apex medial angulation and moderate wapex anterior
angulation at the fracture site. Complete displacement and over
riding noted on the lateral view, distal fracture fragment dorsally
displaced relative to proximal femur.

On lateral view dorsal displacement of fracture fragments are in
close proximity to calcified femoral vasculature.

Lateral view of the LEFT hip/proximal femur with limited evaluation
due to overlap of stabilization device.
IMPRESSION: Displaced and angulated spiral fracture of the distal femoral
diaphysis as described. Limited evaluation of the LEFT hip/proximal
femur due to overlap of stabilization device.

Markedly displaced mid to distal femoral fracture is in close
proximity to calcified femoral vasculature. Consider CT for vascular
assessment as clinically warranted.

## 2020-10-30 MED ORDER — ZINC SULFATE 220 (50 ZN) MG PO CAPS
220.0000 mg | ORAL_CAPSULE | Freq: Every day | ORAL | Status: DC
  Administered 2020-10-30 – 2020-11-05 (×7): 220 mg via ORAL
  Filled 2020-10-30 (×7): qty 1

## 2020-10-30 MED ORDER — GUAIFENESIN-DM 100-10 MG/5ML PO SYRP: 10.0000 mL | ORAL_SOLUTION | ORAL | Status: DC | PRN

## 2020-10-30 MED ORDER — SENNA 8.6 MG PO TABS
1.0000 | ORAL_TABLET | Freq: Two times a day (BID) | ORAL | Status: DC
  Administered 2020-10-31 – 2020-11-07 (×15): 8.6 mg via ORAL
  Filled 2020-10-30 (×16): qty 1

## 2020-10-30 MED ORDER — POVIDONE-IODINE 10 % EX SWAB: 2.0000 "application " | Freq: Once | CUTANEOUS | Status: DC

## 2020-10-30 MED ORDER — SENNOSIDES-DOCUSATE SODIUM 8.6-50 MG PO TABS: 1.0000 | ORAL_TABLET | Freq: Every day | ORAL | Status: DC | PRN

## 2020-10-30 MED ORDER — INSULIN GLARGINE 100 UNIT/ML ~~LOC~~ SOLN
5.0000 [IU] | Freq: Two times a day (BID) | SUBCUTANEOUS | Status: DC
  Administered 2020-10-30 – 2020-11-01 (×4): 5 [IU] via SUBCUTANEOUS
  Filled 2020-10-30 (×5): qty 0.05

## 2020-10-30 MED ORDER — ALBUTEROL SULFATE HFA 108 (90 BASE) MCG/ACT IN AERS
2.0000 | INHALATION_SPRAY | Freq: Four times a day (QID) | RESPIRATORY_TRACT | Status: DC
  Administered 2020-10-30: 2 via RESPIRATORY_TRACT
  Filled 2020-10-30: qty 6.7

## 2020-10-30 MED ORDER — LIDOCAINE-PRILOCAINE 2.5-2.5 % EX CREA
1.0000 "application " | TOPICAL_CREAM | CUTANEOUS | Status: DC | PRN
  Filled 2020-10-30: qty 5

## 2020-10-30 MED ORDER — HYDROCODONE-ACETAMINOPHEN 5-325 MG PO TABS
1.0000 | ORAL_TABLET | Freq: Four times a day (QID) | ORAL | Status: DC | PRN
Start: 2020-10-30 — End: 2020-10-31
  Administered 2020-10-30: 1 via ORAL
  Filled 2020-10-30: qty 1

## 2020-10-30 MED ORDER — ATORVASTATIN CALCIUM 10 MG PO TABS
20.0000 mg | ORAL_TABLET | Freq: Every day | ORAL | Status: DC
  Administered 2020-10-30 – 2020-11-28 (×30): 20 mg via ORAL
  Filled 2020-10-30 (×30): qty 2

## 2020-10-30 MED ORDER — CHLORHEXIDINE GLUCONATE 4 % EX LIQD
60.0000 mL | Freq: Once | CUTANEOUS | Status: AC
  Administered 2020-10-30: 4 via TOPICAL
  Filled 2020-10-30: qty 15

## 2020-10-30 MED ORDER — TAMSULOSIN HCL 0.4 MG PO CAPS
0.4000 mg | ORAL_CAPSULE | Freq: Every day | ORAL | Status: DC
  Administered 2020-10-30 – 2020-11-28 (×30): 0.4 mg via ORAL
  Filled 2020-10-30 (×30): qty 1

## 2020-10-30 MED ORDER — SODIUM CHLORIDE 1 G PO TABS
1.0000 g | ORAL_TABLET | Freq: Every morning | ORAL | Status: DC
  Administered 2020-11-01 – 2020-11-26 (×26): 1 g via ORAL
  Filled 2020-10-30 (×27): qty 1

## 2020-10-30 MED ORDER — VITAMIN K1 10 MG/ML IJ SOLN
2.5000 mg | Freq: Once | INTRAVENOUS | Status: AC
  Administered 2020-10-30: 2.5 mg via INTRAVENOUS
  Filled 2020-10-30 (×2): qty 0.25

## 2020-10-30 MED ORDER — ALBUTEROL SULFATE HFA 108 (90 BASE) MCG/ACT IN AERS
2.0000 | INHALATION_SPRAY | Freq: Two times a day (BID) | RESPIRATORY_TRACT | Status: DC
  Administered 2020-10-31 (×2): 2 via RESPIRATORY_TRACT
  Filled 2020-10-30: qty 6.7

## 2020-10-30 MED ORDER — ENSURE ENLIVE PO LIQD
237.0000 mL | Freq: Two times a day (BID) | ORAL | Status: DC
  Filled 2020-10-30: qty 237
  Filled 2020-10-30: qty 474

## 2020-10-30 MED ORDER — HEPARIN (PORCINE) 25000 UT/250ML-% IV SOLN
1200.0000 [IU]/h | INTRAVENOUS | Status: DC
  Administered 2020-10-30: 1200 [IU]/h via INTRAVENOUS
  Filled 2020-10-30 (×2): qty 250

## 2020-10-30 MED ORDER — HEPARIN BOLUS VIA INFUSION
5000.0000 [IU] | Freq: Once | INTRAVENOUS | Status: AC
  Administered 2020-10-30: 5000 [IU] via INTRAVENOUS
  Filled 2020-10-30: qty 5000

## 2020-10-30 MED ORDER — CEFAZOLIN SODIUM-DEXTROSE 2-4 GM/100ML-% IV SOLN
2.0000 g | INTRAVENOUS | Status: AC
  Administered 2020-10-31: 2 g via INTRAVENOUS

## 2020-10-30 MED ORDER — MORPHINE SULFATE (PF) 2 MG/ML IV SOLN
0.5000 mg | INTRAVENOUS | Status: DC | PRN
Start: 2020-10-30 — End: 2020-10-31

## 2020-10-30 MED ORDER — ALBUTEROL SULFATE HFA 108 (90 BASE) MCG/ACT IN AERS
2.0000 | INHALATION_SPRAY | Freq: Four times a day (QID) | RESPIRATORY_TRACT | Status: DC | PRN
  Administered 2020-11-25: 2 via RESPIRATORY_TRACT
  Filled 2020-10-30: qty 6.7

## 2020-10-30 MED ORDER — INSULIN ASPART 100 UNIT/ML ~~LOC~~ SOLN
6.0000 [IU] | Freq: Three times a day (TID) | SUBCUTANEOUS | Status: DC
  Administered 2020-10-31 – 2020-11-03 (×7): 6 [IU] via SUBCUTANEOUS

## 2020-10-30 MED ORDER — INSULIN ASPART 100 UNIT/ML ~~LOC~~ SOLN
0.0000 [IU] | Freq: Three times a day (TID) | SUBCUTANEOUS | Status: DC
  Administered 2020-10-31: 5 [IU] via SUBCUTANEOUS
  Administered 2020-10-31 (×2): 2 [IU] via SUBCUTANEOUS
  Administered 2020-11-01: 9 [IU] via SUBCUTANEOUS

## 2020-10-30 MED ORDER — HYDROCOD POLST-CPM POLST ER 10-8 MG/5ML PO SUER: 5.0000 mL | Freq: Two times a day (BID) | ORAL | Status: DC | PRN

## 2020-10-30 MED ORDER — ASCORBIC ACID 500 MG PO TABS
500.0000 mg | ORAL_TABLET | Freq: Every day | ORAL | Status: DC
  Administered 2020-10-30 – 2020-11-05 (×7): 500 mg via ORAL
  Filled 2020-10-30 (×7): qty 1

## 2020-10-30 MED ORDER — DEXAMETHASONE 2 MG PO TABS
2.0000 mg | ORAL_TABLET | Freq: Every day | ORAL | Status: DC
  Administered 2020-10-30: 2 mg via ORAL
  Filled 2020-10-30 (×2): qty 1

## 2020-10-30 MED ORDER — MIDODRINE HCL 5 MG PO TABS
10.0000 mg | ORAL_TABLET | Freq: Three times a day (TID) | ORAL | Status: DC
  Administered 2020-10-31 – 2020-11-28 (×82): 10 mg via ORAL
  Filled 2020-10-30 (×81): qty 2

## 2020-10-30 NOTE — H&P (Signed)
History and Physical    Dennis [name]. KGU:[id number] DOB: 1961-01-20 DOA: 10/30/2020  Referring MD/NP/PA: Krista Blue, PA-C PCP: [name], MD  Patient coming from: Via EMS  Chief Complaint: fall  I have personally briefly reviewed patient's old medical records in [place]   HPI: Dennis [name]. is a 60 y.o. 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  He landed on his butt, and immediately saw the deformity to his left leg.  He did not hit his head or lose consciousness.  Any movement of the left leg causes significant pain.  Patient admits that he has had a intermittently productive cough over the last few days.  He had been around his brother and sister-in-law who tested positive for COVID today.  Patient reports that he just received his booster shot approximately 2 months ago.  ED Course: On admission patient was seen to be afebrile, pulse 99-1 03, respirations 21-25, blood pressures elevated up to 140/108, and O2 saturations maintained on room air.  Labs were significant for hemoglobin 11.9, BUN 19, creatinine 1.41, albumin 2.8, and INR 1.9.  X-ray imaging revealed distal left femoral fracture.  COVID-19 screening was positive.  Orthopedics was formally consulted and had an order for patient to be given 2.5 mg of vitamin K.  Patient to be placed in Buck's traction.  Plan is for surgery morning at 10 AM, but may have to be moved due to COVID-positive status.    Review of Systems  Constitutional: Negative for fever and weight loss.  HENT: Negative for ear pain.   Eyes: Negative for photophobia and pain.  Respiratory: Positive for cough. Negative for shortness of breath and wheezing.   Cardiovascular: Negative for chest pain and leg swelling.   Gastrointestinal: Negative for abdominal pain, nausea and vomiting.  Genitourinary: Positive for urgency. Negative for dysuria and hematuria.  Musculoskeletal: Positive for falls and joint pain.  Neurological: Positive for speech change. Negative for loss of consciousness.  Psychiatric/Behavioral: The patient does not have insomnia.     Past Medical History:  Diagnosis Date  . Alcohol abuse with alcohol-induced mood disorder ([place]) 08/18/2015  . Automatic implantable cardioverter-defibrillator in situ 2012   S/P St Jude Dual Chamber. ICD.  Marland Kitchen CAD (coronary artery disease) 11/16/2014   CAD Coronary angiography (12/2008) with mid LAD totally occluded and collateralized.  . Cardiac LV ejection fraction 10-20%   . Chest pain 09/04/2018  . CHF (congestive heart failure) ([place])   . Chronic systolic heart failure (HCC)    a) Mixed ICM/NICM b) RHC (05/2014): RA 2, RV 19/2/3, PA 22/14 (18), PCWP 6, Fick CO/CI: 5.2 / 2.7, PVR 2.3 WU, PA 60% and 64% c) ECHO (05/2014): EF 20-25%, diff HK, akinesis entireanteroseptal myocardium, triv AI, mod MR, LA mod/sev dilated  . Cocaine abuse ([place]) 01/24/2015  . Cocaine abuse with cocaine-induced mood disorder ([place]) 08/20/2015  . Drug abuse and dependence ([place])   . Dysphagia following cerebral infarction 01/24/2015  . Embolic stroke involving right middle cerebral artery ([place])   . Extensive stage primary small cell carcinoma of lung ([place]) 01/04/2020  . H/O noncompliance with medical treatment, presenting hazards to health 01/24/2015  . Heart murmur   . High cholesterol   . Ischemic cardiomyopathy    a)  Coronary angiography (12/2008) at [facility]: Lmain: nl, LAD mid 100% stenosis with left to left and right-to-left collaterals to the distal LAD; Lcx: nl, RCA nl.    . 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[place])   . MDD (major depressive disorder), recurrent severe, without psychosis ([place]) 08/18/2015  . Open wound of foot 02/27/2015  . Pneumonia 1988  . Psychoactive  substance-induced mood disorder ([place]) 07/17/2015  . Sleep apnea    "cleared after T&A"  . Status post PICC central line placement   . Stroke ([place])   . Substance induced mood disorder ([place]) 08/17/2015  . Type II diabetes mellitus ([place])     Past Surgical History:  Procedure Laterality Date  . BRONCHIAL BRUSHINGS  12/28/2019   Procedure: BRONCHIAL BRUSHINGS;  Surgeon: Garner Nash, DO;  Location: [facility];  Service: Thoracic;;  . BRONCHIAL NEEDLE ASPIRATION BIOPSY  12/28/2019   Procedure: BRONCHIAL NEEDLE ASPIRATION BIOPSIES;  Surgeon: Garner Nash, DO;  Location: [place];  Service: Thoracic;;  . BRONCHIAL WASHINGS  12/28/2019   Procedure: BRONCHIAL WASHINGS;  Surgeon: Garner Nash, DO;  Location: [place];  Service: Thoracic;;  . CARDIAC CATHETERIZATION  05/2014  . CARDIAC DEFIBRILLATOR PLACEMENT  03/2011  . CHOLECYSTECTOMY  1994  . FOREARM FRACTURE SURGERY Left 1980  . FRACTURE SURGERY    . IR IMAGING GUIDED PORT INSERTION  10/11/2020  . RIGHT HEART CATHETERIZATION N/A 05/30/2014   Procedure: RIGHT HEART CATH;  Surgeon: Jolaine Artist, MD;  Location: [facility] C[facility] LAB;  Service: Cardiovascular;  Laterality: N/A;  . RIGHT HEART CATHETERIZATION N/A 02/07/2015   Procedure: RIGHT HEART CATH;  Surgeon: Jolaine Artist, MD;  Location: [facility] CATH LAB;  Service: Cardiovascular;  Laterality: N/A;  . TONSILLECTOMY AND ADENOIDECTOMY  ~ 1998  . VIDEO BRONCHOSCOPY WITH ENDOBRONCHIAL ULTRASOUND N/A 12/28/2019   Procedure: VIDEO BRONCHOSCOPY WITH ENDOBRONCHIAL ULTRASOUND;  Surgeon: Garner Nash, DO;  Location: [facility];  Service: Thoracic;  Laterality: N/A;     reports that he has quit smoking. His smoking use included cigarettes. He has a 7.75 pack-year smoking history. He has never used smokeless tobacco. He reports previous alcohol use. He reports current drug use. Drug: Cocaine.  No Known Allergies  Family History  Problem Relation Age of Onset  . Diabetes Mother   .  Heart disease Mother   . Diabetes Father   . Prostate cancer Father   . Heart disease Father   . Diabetes Brother   . Diabetes Brother     Prior to Admission medications   Medication Sig Start Date End Date Taking? Authorizing Provider  Accu-Chek Softclix Lancets lancets  02/03/20   [provider]  atorvastatin (LIPITOR) 20 MG tablet TAKE 1 TABLET BY MOUTH DAILY AT 6 PM. 03/11/17   Bensimhon, Shaune Pascal, MD  Blood Glucose Monitoring Suppl (FIFTY50 GLUCOSE METER 2.0) w/Device KIT Use as instructed 02/03/20   [provider]  dexamethasone (DECADRON) 2 MG tablet Take 1 tablet (2 mg total) by mouth daily. 08/27/20   Ventura Sellers, MD  feeding supplement (ENSURE ENLIVE / ENSURE PLUS) LIQD Take 237 mLs by mouth 2 (two) times daily between meals. 07/26/20   Eugenie Filler, MD  insulin glargine (LANTUS SOLOSTAR) 100 UNIT/ML Solostar Pen Inject 25 Units into the skin 2 (two) times daily. 07/25/20   Eugenie Filler, MD  insulin lispro (HUMALOG KWIKPEN) 100 UNIT/ML KwikPen Inject 6 Units into the skin 3 (three) times daily. 07/25/20   Eugenie Filler, MD  INSUPEN  ULTRAFIN 31G X 8 MM MISC  03/02/20   [provider]  lidocaine-prilocaine (EMLA) cream Apply 1 application topically as needed. 09/24/20   Heilingoetter, Cassandra L, PA-C  midodrine (PROAMATINE) 10 MG tablet Take 1 tablet (10 mg total) by mouth 3 (three) times daily with meals. 07/25/20   Eugenie Filler, MD  sennosides-docusate sodium (SENOKOT-S) 8.6-50 MG tablet Take 1 tablet by mouth daily. As needed    [provider]  sodium chloride 1 g tablet Take 2 tablets (2 g total) by mouth 3 (three) times daily with meals. 07/25/20   Eugenie Filler, MD  tamsulosin (FLOMAX) 0.4 MG CAPS capsule Take 0.4 mg by mouth daily. 10/22/20   [provider]  warfarin (COUMADIN) 7.5 MG tablet TAKE 1 TABLET BY MOUTH DAILY AT 6PM. Patient taking differently: Take 7.5 mg by mouth every evening. 01/20/20    Bensimhon, Shaune Pascal, MD    Physical Exam:  Constitutional: Middle-age male who appears currently acute distress Vitals:   10/30/20 1125 10/30/20 1200 10/30/20 1309 10/30/20 1438  BP: (!) 133/103 (!) 133/106 (!) 140/108 (!) 130/98  Pulse: (!) 103 100 100 99  Resp:  (!) 26 (!) 21 (!) 26  Temp: 98.1 F (36.7 C)     TempSrc: Oral     SpO2: 92% 93% 95% 96%   Eyes: PERRL, lids and conjunctivae normal ENMT: Mucous membranes are moist. Posterior pharynx clear of any exudate or lesions.  Neck: normal, supple, no masses, no thyromegaly Respiratory: clear to auscultation bilaterally, no wheezing, no crackles. Normal respiratory effort. No accessory muscle use.  Cardiovascular: Mildly tachycardic with positive systolic murmur. no extremity edema. 2+ pedal pulses. No carotid bruits.  Abdomen: no tenderness, no masses palpated. No hepatosplenomegaly. Bowel sounds positive.  Musculoskeletal: no clubbing / cyanosis.  Left leg currently in traction. Skin: no rashes, lesions, ulcers. No induration Neurologic: CN 2-12 grossly intact.  Patient able to move all extremities  psychiatric: Normal judgment and insight. Alert and oriented x 3. Normal mood.     Labs on Admission: I have personally reviewed following labs and imaging studies  CBC: Recent Labs  Lab 10/30/20 1151  WBC 10.3  HGB 11.9*  HCT 39.5  MCV 98.5  PLT 712   Basic Metabolic Panel: Recent Labs  Lab 10/30/20 1151  NA 141  K 4.4  CL 109  CO2 22  GLUCOSE 294*  BUN 19  CREATININE 1.41*  CALCIUM 8.9   GFR: CrCl cannot be calculated (Unknown ideal weight.). Liver Function Tests: Recent Labs  Lab 10/30/20 1151  AST 33  ALT 25  ALKPHOS 95  BILITOT 1.0  PROT 5.8*  ALBUMIN 2.8*   No results for input(s): LIPASE, AMYLASE in the last 168 hours. No results for input(s): AMMONIA in the last 168 hours. Coagulation Profile: Recent Labs  Lab 10/30/20 1151  INR 1.9*   Cardiac Enzymes: No results for input(s):  CKTOTAL, CKMB, CKMBINDEX, TROPONINI in the last 168 hours. BNP (last 3 results) No results for input(s): PROBNP in the last 8760 hours. HbA1C: No results for input(s): HGBA1C in the last 72 hours. CBG: No results for input(s): GLUCAP in the last 168 hours. Lipid Profile: No results for input(s): CHOL, HDL, LDLCALC, TRIG, CHOLHDL, LDLDIRECT in the last 72 hours. Thyroid Function Tests: No results for input(s): TSH, T4TOTAL, FREET4, T3FREE, THYROIDAB in the last 72 hours. Anemia Panel: No results for input(s): VITAMINB12, FOLATE, FERRITIN, TIBC, IRON, RETICCTPCT in the last 72 hours. Urine analysis:  Component Value Date/Time   COLORURINE YELLOW 07/07/2020 1628   APPEARANCEUR CLEAR 07/07/2020 1628   LABSPEC 1.014 07/07/2020 1628   PHURINE 5.0 07/07/2020 1628   GLUCOSEU 50 (A) 07/07/2020 1628   HGBUR NEGATIVE 07/07/2020 1628   BILIRUBINUR NEGATIVE 07/07/2020 1628   KETONESUR NEGATIVE 07/07/2020 1628   PROTEINUR 100 (A) 07/07/2020 1628   UROBILINOGEN 1.0 07/16/2015 1715   NITRITE NEGATIVE 07/07/2020 1628   LEUKOCYTESUR NEGATIVE 07/07/2020 1628   Sepsis Labs: Recent Results (from the past 240 hour(s))  Resp Panel by RT-PCR (Flu A&B, Covid) Nasopharyngeal Swab     Status: Abnormal   Collection Time: 10/30/20 11:56 AM   Specimen: Nasopharyngeal Swab; Nasopharyngeal(NP) swabs in vial transport medium  Result Value Ref Range Status   SARS Coronavirus 2 by RT PCR POSITIVE (A) NEGATIVE Final    Comment: RESULT CALLED TO, READ BACK BY AND VERIFIED WITHAlfonso Patten [facility] RN 1429 10/30/20 A BROWNING (NOTE) SARS-CoV-2 target nucleic acids are DETECTED.  The SARS-CoV-2 RNA is generally detectable in upper respiratory specimens during the acute phase of infection. Positive results are indicative of the presence of the identified virus, but do not rule out bacterial infection or co-infection with other pathogens not detected by the test. Clinical correlation with patient history and other  diagnostic information is necessary to determine patient infection status. The expected result is Negative.  Fact Sheet for Patients: EntrepreneurPulse.com.au  Fact Sheet for Healthcare Providers: IncredibleEmployment.be  This test is not yet approved or cleared by the [place] FDA and  has been authorized for detection and/or diagnosis of SARS-CoV-2 by FDA under an Emergency Use Authorization (EUA).  This EUA will remain in effect (meaning this test can  be used) for the duration of  the COVID-19 declaration under Section 564(b)(1) of the Act, 21 U.S.C. section 360bbb-3(b)(1), unless the authorization is terminated or revoked sooner.     Influenza A by PCR NEGATIVE NEGATIVE Final   Influenza B by PCR NEGATIVE NEGATIVE Final    Comment: (NOTE) The Xpert Xpress SARS-CoV-2/FLU/RSV plus assay is intended as an aid in the diagnosis of influenza from Nasopharyngeal swab specimens and should not be used as a sole basis for treatment. Nasal washings and aspirates are unacceptable for Xpert Xpress SARS-CoV-2/FLU/RSV testing.  Fact Sheet for Patients: EntrepreneurPulse.com.au  Fact Sheet for Healthcare Providers: IncredibleEmployment.be  This test is not yet approved or cleared by the [place] FDA and has been authorized for detection and/or diagnosis of SARS-CoV-2 by FDA under an Emergency Use Authorization (EUA). This EUA will remain in effect (meaning this test can be used) for the duration of the COVID-19 declaration under Section 564(b)(1) of the Act, 21 U.S.C. section 360bbb-3(b)(1), unless the authorization is terminated or revoked.  Performed at [facility], [place] [address] [place], [place] [address]      Radiological Exams on Admission: DG Pelvis Portable  Result Date: 10/30/2020 CLINICAL DATA:  Fall EXAM: PORTABLE PELVIS 1-2 VIEWS COMPARISON:  CT PET of January 17, 2020 FINDINGS:  Degenerative changes about the bilateral hips. Irregularity along the iliopectineal line on the LEFT.  Osteopenia. No displaced fractures. IMPRESSION: 1. Irregularity along the LEFT iliopectineal line, suspicious for nondisplaced fracture about the LEFT acetabulum. 2. Degenerative changes about the hips. Electronically Signed   By: Zetta Bills M.D.   On: 10/30/2020 13:03   DG Tibia/Fibula Left Port  Result Date: 10/30/2020 CLINICAL DATA:  Fall, deformity of the knee. EXAM: PORTABLE LEFT TIBIA AND FIBULA - 2 VIEW COMPARISON:  Femoral and pelvic evaluation of the same date. FINDINGS: Overlapping stabilization device limiting assessment. No signs of fracture of the tibia or fibula. No joint effusion noted incidentally in the knee. Incidental note made of mid to distal femoral fracture at the upper margin of the submitted radiographs. Question of medial patellar subluxation versus is artifact from oblique nature of the submitted image. This could also be related to the severity of the fracture in the femur above. IMPRESSION: 1. No signs of fracture of the tibia or fibula. 2. Incidental note is made of mid to distal femoral fracture at the upper margin of the submitted radiographs. 3. Question medial patellar subluxation versus artifact from oblique nature of the submitted image. Electronically Signed   By: Zetta Bills M.D.   On: 10/30/2020 13:06   DG FEMUR PORT 1V LEFT  Result Date: 10/30/2020 CLINICAL DATA:  Fall, deformity about the knee. EXAM: LEFT FEMUR PORTABLE 1 VIEW COMPARISON:  None FINDINGS: Distal femoral diaphyseal spiral fracture with butterfly fragment and a half shaft with medial displacement of the distal fracture fragment, mild apex medial angulation and moderate wapex anterior angulation at the fracture site. Complete displacement and over riding noted on the lateral view, distal fracture fragment dorsally displaced relative to proximal femur. On lateral view dorsal displacement of  fracture fragments are in close proximity to calcified femoral vasculature. Lateral view of the LEFT hip/proximal femur with limited evaluation due to overlap of stabilization device. IMPRESSION: Displaced and angulated spiral fracture of the distal femoral diaphysis as described. Limited evaluation of the LEFT hip/proximal femur due to overlap of stabilization device. Markedly displaced mid to distal femoral fracture is in close proximity to calcified femoral vasculature. Consider CT for vascular assessment as clinically warranted. Electronically Signed   By: Zetta Bills M.D.   On: 10/30/2020 12:56    EKG: Independently reviewed.  Distal femur fracture appreciated  Assessment/Plan Distal femoral fracture secondary to fall: Acute.  Patient presents after slipping and falling on ice.  Found to have an acute left distal femur fracture.  Orthopedics consulted and plan on surgery in a.m. -Admit to a medical telemetry bed -Hip fracture order set utilized -N.p.o. after midnight -Hydrocodone/morphine IV as needed for moderate to severe pain -Appreciate orthopedic consultative services, we will follow-up further recommendations  COVID-19 infection: Acute.  Patient reports he is with his brother and sister-in-law over the weekend and they both tested positive for COVID.  He had been vaccinated and received his booster approximately 2 months ago. -COVID-19 order set utilized -Continuous pulse oximetry with nasal cannula oxygen maintain O2 saturation greater than 90% -Add on inflammatory markers and monitor daily -Follow-up chest x-ray  Chronic systolic CHF s/p AICD: Last EF noted to be around 25-30% in 06/2020 which is improved from previous 20-25% in 12/2019. -Strict intake and output -Daily weight -Sitter needed gentle IV diuresis if needed  On chronic anticoagulation subtherapeutic INR: On admission INR was noted to be 1.9. -Hold Coumadin -Heparin per pharmacy with orders to stop heparin 6  hours prior to surgical procedure  Small cell lung cancer with metastatic brain lesion: Patient followed by Dr. Mickeal Skinner and Dr.Mohammed.  Patient underwent whole brain radiation and chemotherapy.  He is scheduled to have a repeat MRI of the brain January 30. -Continue Decadron -Continue outpatient follow-up  Orthostatic hypotension: Blood pressures currently 116/90. -Continue midodrine and salt tablets  Dysphagia: Patient with recent speech evaluation which noted mild oropharyngeal dysphagia likely secondary to prior stroke and/or metastatic brain lesion.  He was recommended a regular diet with thin liquids and for pills to be given in pure -Aspiration precautions -Nursing care order for pills to be given in applesauce  Diabetes mellitus type 2 uncontrolled with hyperglycemia: On admission patient presents with glucose elevated up to 294.  Last hemoglobin A1c was was noted to be 8.7 on 10/11/2020.  Patient normally takes 25 units of Lantus twice daily and Humalog 6 units with meals  -Hypoglycemic protocols -Will continue Humalog 6 units with meals -CBGs before every meal with sensitive SSI  -Decrease Lantus to 5 units twice daily prior to surgery as patient has been n.p.o. for most of the day and repeat blood sugar was down to 189  History of right MCA stroke: Patient with a right MCA stroke in 06/2020 with residual dysarthria and left weakness -Continue statin and restart anticoagulation when the  Cocaine abuse: Patient with prior history of cocaine abuse.  Last UDS positive cocaine abuse. -Repeat UDS  Hyperlipidemia -Continue atorvastatin 20 mg daily DVT prophylaxis: Heparin Code Status: Full Family Communication: None request Disposition Plan: To be determined Consults called: Orthopedics Admission status: Inpatient, require more than 2 midnight stay  Norval Morton MD [facility]   If 7PM-7AM, please contact night-coverage   10/30/2020, 3:13 PM

## 2020-10-30 NOTE — ED Notes (Addendum)
Patient brought in EMS d/t a slip and fall on the ice at a has station, Per EMS possible L femur fx. Leg in traction at this time.   Pedal pulses present and marked on L foot. Able to feel sensation and wiggle toes. No open wounds and no obvious trauma.   No LOC, patient is on warfarin, did not hit his head when he fell.   Patient A0x4 at this time, pain 5/10.  Patient in c-collar as precaution.

## 2020-10-30 NOTE — Progress Notes (Signed)
Orthopedic Tech Progress Note Patient Details:  Dennis [name]. 1961/07/23 [id number]  Musculoskeletal Traction Type of Traction: Bucks Skin Traction Traction Location: LLE Traction Weight: 10 lbs   Post Interventions Patient Tolerated: Well Instructions Provided: Care of [place] 10/30/2020, 5:42 PM

## 2020-10-30 NOTE — Progress Notes (Addendum)
ANTICOAGULATION CONSULT NOTE - Initial Consult  Pharmacy Consult for IV heparin Indication: bridging for procedure, hx CVA on warfarin  No Known Allergies  Patient Measurements:  Weight per chart review: 83.9 kg (10/22/20) IBW: 78 kg Heparin Dosing Weight: 83.9 kg  Vital Signs: Temp: 98.1 F (36.7 C) (01/18 1125) Temp Source: Oral (01/18 1125) BP: 130/98 (01/18 1438) Pulse Rate: 99 (01/18 1438)  Labs: Recent Labs    10/30/20 1151  HGB 11.9*  HCT 39.5  PLT 198  LABPROT 21.1*  INR 1.9*  CREATININE 1.41*    CrCl cannot be calculated (Unknown ideal weight.).   Medical History: Past Medical History:  Diagnosis Date  . Alcohol abuse with alcohol-induced mood disorder ([place]) 08/18/2015  . Automatic implantable cardioverter-defibrillator in situ 2012   S/P St Jude Dual Chamber. ICD.  Marland Kitchen CAD (coronary artery disease) 11/16/2014   CAD Coronary angiography (12/2008) with mid LAD totally occluded and collateralized.  . Cardiac LV ejection fraction 10-20%   . Chest pain 09/04/2018  . CHF (congestive heart failure) ([place])   . Chronic systolic heart failure (HCC)    a) Mixed ICM/NICM b) RHC (05/2014): RA 2, RV 19/2/3, PA 22/14 (18), PCWP 6, Fick CO/CI: 5.2 / 2.7, PVR 2.3 WU, PA 60% and 64% c) ECHO (05/2014): EF 20-25%, diff HK, akinesis entireanteroseptal myocardium, triv AI, mod MR, LA mod/sev dilated  . Cocaine abuse ([place]) 01/24/2015  . Cocaine abuse with cocaine-induced mood disorder ([place]) 08/20/2015  . Drug abuse and dependence ([place])   . Dysphagia following cerebral infarction 01/24/2015  . Embolic stroke involving right middle cerebral artery ([place])   . Extensive stage primary small cell carcinoma of lung ([place]) 01/04/2020  . H/O noncompliance with medical treatment, presenting hazards to health 01/24/2015  . Heart murmur   . High cholesterol   . Ischemic cardiomyopathy    a) Coronary angiography (12/2008) at [facility]: Lmain: nl, LAD mid 100% stenosis with left to left and right-to-left  collaterals to the distal LAD; Lcx: nl, RCA nl.    . 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[place])   . MDD (major depressive disorder), recurrent severe, without psychosis ([place]) 08/18/2015  . Open wound of foot 02/27/2015  . Pneumonia 1988  . Psychoactive substance-induced mood disorder ([place]) 07/17/2015  . Sleep apnea    "cleared after T&A"  . Status post PICC central line placement   . Stroke ([place])   . Substance induced mood disorder ([place]) 08/17/2015  . Type II diabetes mellitus (HCC)     Medications:  Medications Prior to Admission  Medication Sig Dispense Refill Last Dose  . Accu-Chek Softclix Lancets lancets      . atorvastatin (LIPITOR) 20 MG tablet TAKE 1 TABLET BY MOUTH DAILY AT 6 PM. 30 tablet 6   . Blood Glucose Monitoring Suppl (FIFTY50 GLUCOSE METER 2.0) w/Device KIT Use as instructed     . dexamethasone (DECADRON) 2 MG tablet Take 1 tablet (2 mg total) by mouth daily. 60 tablet 0   . feeding supplement (ENSURE ENLIVE / ENSURE PLUS) LIQD Take 237 mLs by mouth 2 (two) times daily between meals. 237 mL 0   . insulin glargine (LANTUS SOLOSTAR) 100 UNIT/ML Solostar Pen Inject 25 Units into the skin 2 (two) times daily. 15 mL 2   . insulin lispro (HUMALOG KWIKPEN) 100 UNIT/ML KwikPen Inject 6 Units into the skin 3 (three) times daily. 15 mL 1   . INSUPEN ULTRAFIN 31G X 8 MM MISC      . lidocaine-prilocaine (EMLA) cream  Apply 1 application topically as needed. 30 g 2   . midodrine (PROAMATINE) 10 MG tablet Take 1 tablet (10 mg total) by mouth 3 (three) times daily with meals. 90 tablet 2   . sennosides-docusate sodium (SENOKOT-S) 8.6-50 MG tablet Take 1 tablet by mouth daily. As needed     . sodium chloride 1 g tablet Take 2 tablets (2 g total) by mouth 3 (three) times daily with meals. 180 tablet 1   . tamsulosin (FLOMAX) 0.4 MG CAPS capsule Take 0.4 mg by mouth daily.     Marland Kitchen warfarin (COUMADIN) 7.5 MG tablet TAKE 1 TABLET BY MOUTH DAILY AT 6PM. (Patient taking differently: Take 7.5 mg by mouth  every evening.) 30 tablet 0     Assessment: 60 year old male with PMH significant for hx CHF, lung cancer, CAD, IDDM, hx CVA on warfarin. Presented to ED for fall, x-rays show femur fracture. Plan for ortho surgery tomorrow 1/19 at 1000.   Pharmacy consulted to start heparin infusion bridging for procedure and then hold 6 hours pre-procedure.  INR on admission is 1.9 prior to receiving 2.5 mg IV vitamin K. Hgb 11.9, platelets 198.  Home warfarin per last [facility] visit on 10/17/20:  7.5 mg TTh, 3.75 mg all other days.  Goal of Therapy:  Heparin level 0.3-0.7 units/ml Monitor platelets by anticoagulation protocol: Yes   Plan:  Give 5000 units bolus x 1 Start heparin infusion at 1200 units/hr Stop infusion 1/19 at 0400 (6 hours pre-procedure) Heparin level in 6 hours Follow up plans for resuming anticoagulation post-procedure  Fara Olden, PharmD PGY-1 Pharmacy Resident 10/30/2020 4:30 PM Please see AMION for all pharmacy numbers

## 2020-10-30 NOTE — Consult Note (Signed)
Reason for Consult:Left femur fx Referring Physician: K Horton Time called: [phone] Time at bedside: [name] is an 60 y.o. male.  HPI: Dennis [name] was out walking today and slipped on the ice. He had immediate left thigh pain and could not get up. He was brought to the ED where x-rays showed a femur fx and orthopedic surgery was consulted. He lives with his mom and brother and is on disability. He has an active cancer diagnosis (lung primary) and is on coumadin.  Past Medical History:  Diagnosis Date  . Alcohol abuse with alcohol-induced mood disorder ([place]) 08/18/2015  . Automatic implantable cardioverter-defibrillator in situ 2012   S/P St Jude Dual Chamber. ICD.  Marland Kitchen CAD (coronary artery disease) 11/16/2014   CAD Coronary angiography (12/2008) with mid LAD totally occluded and collateralized.  . Cardiac LV ejection fraction 10-20%   . Chest pain 09/04/2018  . CHF (congestive heart failure) ([place])   . Chronic systolic heart failure (HCC)    a) Mixed ICM/NICM b) RHC (05/2014): RA 2, RV 19/2/3, PA 22/14 (18), PCWP 6, Fick CO/CI: 5.2 / 2.7, PVR 2.3 WU, PA 60% and 64% c) ECHO (05/2014): EF 20-25%, diff HK, akinesis entireanteroseptal myocardium, triv AI, mod MR, LA mod/sev dilated  . Cocaine abuse ([place]) 01/24/2015  . Cocaine abuse with cocaine-induced mood disorder ([place]) 08/20/2015  . Drug abuse and dependence ([place])   . Dysphagia following cerebral infarction 01/24/2015  . Embolic stroke involving right middle cerebral artery ([place])   . Extensive stage primary small cell carcinoma of lung ([place]) 01/04/2020  . H/O noncompliance with medical treatment, presenting hazards to health 01/24/2015  . Heart murmur   . High cholesterol   . Ischemic cardiomyopathy    a) Coronary angiography (12/2008) at [facility]: Lmain: nl, LAD mid 100% stenosis with left to left and right-to-left collaterals to the distal LAD; Lcx: nl, RCA nl.    . 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[place])   . MDD (major depressive disorder),  recurrent severe, without psychosis ([place]) 08/18/2015  . Open wound of foot 02/27/2015  . Pneumonia 1988  . Psychoactive substance-induced mood disorder ([place]) 07/17/2015  . Sleep apnea    "cleared after T&A"  . Status post PICC central line placement   . Stroke ([place])   . Substance induced mood disorder ([place]) 08/17/2015  . Type II diabetes mellitus ([place])     Past Surgical History:  Procedure Laterality Date  . BRONCHIAL BRUSHINGS  12/28/2019   Procedure: BRONCHIAL BRUSHINGS;  Surgeon: Garner Nash, DO;  Location: [place] [facility];  Service: Thoracic;;  . BRONCHIAL NEEDLE ASPIRATION BIOPSY  12/28/2019   Procedure: BRONCHIAL NEEDLE ASPIRATION BIOPSIES;  Surgeon: Garner Nash, DO;  Location: [place];  Service: Thoracic;;  . BRONCHIAL WASHINGS  12/28/2019   Procedure: BRONCHIAL WASHINGS;  Surgeon: Garner Nash, DO;  Location: [place];  Service: Thoracic;;  . CARDIAC CATHETERIZATION  05/2014  . CARDIAC DEFIBRILLATOR PLACEMENT  03/2011  . CHOLECYSTECTOMY  1994  . FOREARM FRACTURE SURGERY Left 1980  . FRACTURE SURGERY    . IR IMAGING GUIDED PORT INSERTION  10/11/2020  . RIGHT HEART CATHETERIZATION N/A 05/30/2014   Procedure: RIGHT HEART CATH;  Surgeon: Jolaine Artist, MD;  Location: [facility] CATH LAB;  Service: Cardiovascular;  Laterality: N/A;  . RIGHT HEART CATHETERIZATION N/A 02/07/2015   Procedure: RIGHT HEART CATH;  Surgeon: Jolaine Artist, MD;  Location: [facility] CATH LAB;  Service: Cardiovascular;  Laterality: N/A;  . TONSILLECTOMY AND ADENOIDECTOMY  ~  [place] WITH ENDOBRONCHIAL ULTRASOUND N/A 12/28/2019   Procedure: VIDEO BRONCHOSCOPY WITH ENDOBRONCHIAL ULTRASOUND;  Surgeon: Garner Nash, DO;  Location: [facility] ENDOSCOPY;  Service: Thoracic;  Laterality: N/A;    Family History  Problem Relation Age of Onset  . Diabetes Mother   . Heart disease Mother   . Diabetes Father   . Prostate cancer Father   . Heart disease Father   . Diabetes Brother   . Diabetes  Brother     Social History:  reports that he has quit smoking. His smoking use included cigarettes. He has a 7.75 pack-year smoking history. He has never used smokeless tobacco. He reports previous alcohol use. He reports current drug use. Drug: Cocaine.  Allergies: No Known Allergies  Medications: I have reviewed the patient's current medications.  Results for orders placed or performed during the hospital encounter of 10/30/20 (from the past 48 hour(s))  Type and screen [place]     Status: None   Collection Time: 10/30/20 11:42 AM  Result Value Ref Range   ABO/RH(D) A POS    Antibody Screen NEG    Sample Expiration      11/02/2020,2359 Performed at [facility], [place] [address] [place], [place] [address]   Comprehensive metabolic panel     Status: Abnormal   Collection Time: 10/30/20 11:51 AM  Result Value Ref Range   Sodium 141 135 - 145 mmol/L   Potassium 4.4 3.5 - 5.1 mmol/L    Comment: SLIGHT HEMOLYSIS   Chloride 109 98 - 111 mmol/L   CO2 22 22 - 32 mmol/L   Glucose, Bld 294 (H) 70 - 99 mg/dL    Comment: Glucose reference range applies only to samples taken after fasting for at least 8 hours.   BUN 19 6 - 20 mg/dL   Creatinine, Ser 1.41 (H) 0.61 - 1.24 mg/dL   Calcium 8.9 8.9 - 10.3 mg/dL   Total Protein 5.8 (L) 6.5 - 8.1 g/dL   Albumin 2.8 (L) 3.5 - 5.0 g/dL   AST 33 15 - 41 U/L   ALT 25 0 - 44 U/L   Alkaline Phosphatase 95 38 - 126 U/L   Total Bilirubin 1.0 0.3 - 1.2 mg/dL   GFR, Estimated 58 (L) >60 mL/min    Comment: (NOTE) Calculated using the CKD-EPI Creatinine Equation (2021)    Anion gap 10 5 - 15    Comment: Performed at [facility], [place] [address] [place], [place] [address]  CBC     Status: Abnormal   Collection Time: 10/30/20 11:51 AM  Result Value Ref Range   WBC 10.3 4.0 - 10.5 K/uL   RBC 4.01 (L) 4.22 - 5.81 MIL/uL   Hemoglobin 11.9 (L) 13.0 - 17.0 g/dL   HCT 39.5 39.0 - 52.0 %   MCV 98.5 80.0 - 100.0 fL   MCH  29.7 26.0 - 34.0 pg   MCHC 30.1 30.0 - 36.0 g/dL   RDW 14.3 11.5 - 15.5 %   Platelets 198 150 - 400 K/uL   nRBC 0.0 0.0 - 0.2 %    Comment: Performed at [facility], [place] [address], [place], [place] [address]  Protime-INR     Status: Abnormal   Collection Time: 10/30/20 11:51 AM  Result Value Ref Range   Prothrombin Time 21.1 (H) 11.4 - 15.2 seconds   INR 1.9 (H) 0.8 - 1.2    Comment: (NOTE) INR goal varies  based on device and disease states. Performed at [facility], [place] [address], [place], [place] [address]     DG Pelvis Portable  Result Date: 10/30/2020 CLINICAL DATA:  Fall EXAM: PORTABLE PELVIS 1-2 VIEWS COMPARISON:  CT PET of January 17, 2020 FINDINGS: Degenerative changes about the bilateral hips. Irregularity along the iliopectineal line on the LEFT.  Osteopenia. No displaced fractures. IMPRESSION: 1. Irregularity along the LEFT iliopectineal line, suspicious for nondisplaced fracture about the LEFT acetabulum. 2. Degenerative changes about the hips. Electronically Signed   By: Zetta Bills M.D.   On: 10/30/2020 13:03   DG Tibia/Fibula Left Port  Result Date: 10/30/2020 CLINICAL DATA:  Fall, deformity of the knee. EXAM: PORTABLE LEFT TIBIA AND FIBULA - 2 VIEW COMPARISON:  Femoral and pelvic evaluation of the same date. FINDINGS: Overlapping stabilization device limiting assessment. No signs of fracture of the tibia or fibula. No joint effusion noted incidentally in the knee. Incidental note made of mid to distal femoral fracture at the upper margin of the submitted radiographs. Question of medial patellar subluxation versus is artifact from oblique nature of the submitted image. This could also be related to the severity of the fracture in the femur above. IMPRESSION: 1. No signs of fracture of the tibia or fibula. 2. Incidental note is made of mid to distal femoral fracture at the upper margin of the submitted radiographs. 3. Question medial patellar subluxation versus  artifact from oblique nature of the submitted image. Electronically Signed   By: Zetta Bills M.D.   On: 10/30/2020 13:06   DG FEMUR PORT 1V LEFT  Result Date: 10/30/2020 CLINICAL DATA:  Fall, deformity about the knee. EXAM: LEFT FEMUR PORTABLE 1 VIEW COMPARISON:  None FINDINGS: Distal femoral diaphyseal spiral fracture with butterfly fragment and a half shaft with medial displacement of the distal fracture fragment, mild apex medial angulation and moderate wapex anterior angulation at the fracture site. Complete displacement and over riding noted on the lateral view, distal fracture fragment dorsally displaced relative to proximal femur. On lateral view dorsal displacement of fracture fragments are in close proximity to calcified femoral vasculature. Lateral view of the LEFT hip/proximal femur with limited evaluation due to overlap of stabilization device. IMPRESSION: Displaced and angulated spiral fracture of the distal femoral diaphysis as described. Limited evaluation of the LEFT hip/proximal femur due to overlap of stabilization device. Markedly displaced mid to distal femoral fracture is in close proximity to calcified femoral vasculature. Consider CT for vascular assessment as clinically warranted. Electronically Signed   By: Zetta Bills M.D.   On: 10/30/2020 12:56    Review of Systems  HENT: Negative for ear discharge, ear pain, hearing loss and tinnitus.   Eyes: Negative for photophobia and pain.  Respiratory: Negative for cough and shortness of breath.   Cardiovascular: Negative for chest pain.  Gastrointestinal: Negative for abdominal pain, nausea and vomiting.  Genitourinary: Negative for dysuria, flank pain, frequency and urgency.  Musculoskeletal: Positive for arthralgias (Left thigh). Negative for back pain, myalgias and neck pain.  Neurological: Negative for dizziness and headaches.  Hematological: Does not bruise/bleed easily.  Psychiatric/Behavioral: The patient is not  nervous/anxious.    Blood pressure (!) 140/108, pulse 100, temperature 98.1 F (36.7 C), temperature source Oral, resp. rate (!) 21, SpO2 95 %. Physical Exam Constitutional:      General: He is not in acute distress.    Appearance: He is well-developed and well-nourished. He is not diaphoretic.  HENT:  Head: Normocephalic and atraumatic.  Eyes:     General: No scleral icterus.       Right eye: No discharge.        Left eye: No discharge.     Conjunctiva/sclera: Conjunctivae normal.  Cardiovascular:     Rate and Rhythm: Normal rate and regular rhythm.  Pulmonary:     Effort: Pulmonary effort is normal. No respiratory distress.  Musculoskeletal:     Cervical back: Normal range of motion.     Comments: LLE No traumatic wounds, ecchymosis, or rash  In hare traction  No knee or ankle effusion  Knee stable to varus/ valgus and anterior/posterior stress  Sens DPN, SPN, TN intact  Motor EHL, ext, flex, evers 5/5  DP 1+, PT 1+, No significant edema  Skin:    General: Skin is warm and dry.  Neurological:     Mental Status: He is alert.  Psychiatric:        Mood and Affect: Mood and affect normal.        Behavior: Behavior normal.     Assessment/Plan: Left femur fx -- Will need IMN tomorrow given need for medical clearance and anticoagulation. Please hold coumadin and keep NPO after MN. Will give one dose vitamin K. Covid+ Multiple medical problems including previous right MCA stroke, small cell lung cancer with metastatic lesion to brain(dx 12/2019),ongoing cocaine abuse, systolic congestive heart failure(last echo 12/2019 EF 20-25%),status post ICD placement, insulin-dependent diabetes mellitus type 2, and remote history of alcohol abuse -- Have asked medicine to admit, manage, and clear for surgery. Appreciate their help.    Lisette Abu, PA-C Orthopedic Surgery [phone] 10/30/2020, 1:45 PM

## 2020-10-30 NOTE — ED Notes (Signed)
Labs and covid sent

## 2020-10-30 NOTE — ED Notes (Signed)
Hx of lung cancer  Has R chest implanted port  ICD in left chest

## 2020-10-30 NOTE — ED Provider Notes (Addendum)
[facility] EMERGENCY DEPARTMENT Provider Note   CSN: [id number] Arrival date & time: 10/30/20  1116     History Chief Complaint  Patient presents with  . Fall    Dennis [name]. is a 60 y.o. male with PMH of IDDM, HTN, HLD, CVS, lung cancer, and CAD on warfarin who presents to the ED via EMS for fall.  On my examination, patient reports that he was walking on the ice when his leg slipped out from underneath him.  He fell and landed on his butt.  He then noticed deformity to his left leg and was afraid to move it which is what prompted him to call EMS.  They expressed concern for femoral fracture.  He denies any head injury, neck pain, back pain, loss of consciousness, chest pain, abdominal pain, numbness, weakness, blurred vision, headache, difficulty breathing, or other symptoms.  He states he takes daily warfarin for his heart disease.  He arrived in a c-collar, but is asking that I remove it.  He denies any head injury or neck pain.  His leg pain he reports is a manageable 4/10.    HPI     Past Medical History:  Diagnosis Date  . Alcohol abuse with alcohol-induced mood disorder ([place]) 08/18/2015  . Automatic implantable cardioverter-defibrillator in situ 2012   S/P St Jude Dual Chamber. ICD.  Marland Kitchen CAD (coronary artery disease) 11/16/2014   CAD Coronary angiography (12/2008) with mid LAD totally occluded and collateralized.  . Cardiac LV ejection fraction 10-20%   . Chest pain 09/04/2018  . CHF (congestive heart failure) ([place])   . Chronic systolic heart failure (HCC)    a) Mixed ICM/NICM b) RHC (05/2014): RA 2, RV 19/2/3, PA 22/14 (18), PCWP 6, Fick CO/CI: 5.2 / 2.7, PVR 2.3 WU, PA 60% and 64% c) ECHO (05/2014): EF 20-25%, diff HK, akinesis entireanteroseptal myocardium, triv AI, mod MR, LA mod/sev dilated  . Cocaine abuse ([place]) 01/24/2015  . Cocaine abuse with cocaine-induced mood disorder ([place]) 08/20/2015  . Drug abuse and dependence ([place])   . Dysphagia following  cerebral infarction 01/24/2015  . Embolic stroke involving right middle cerebral artery ([place])   . Extensive stage primary small cell carcinoma of lung ([place]) 01/04/2020  . H/O noncompliance with medical treatment, presenting hazards to health 01/24/2015  . Heart murmur   . High cholesterol   . Ischemic cardiomyopathy    a) Coronary angiography (12/2008) at [facility]: L[place]: nl, LAD mid 100% stenosis with left to left and right-to-left collaterals to the distal LAD; Lcx: nl, RCA nl.    . 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 MDD (major depressive disorder), recurrent severe, without psychosis ([place]) 08/18/2015  . Open wound of foot 02/27/2015  . Pneumonia 1988  . Psychoactive substance-induced mood disorder ([place]) 07/17/2015  . Sleep apnea    "cleared after T&A"  . Status post PICC central line placement   . Stroke ([place])   . Substance induced mood disorder ([place]) 08/17/2015  . Type II diabetes mellitus [facility])     Patient Active Problem List   Diagnosis Date Noted  . Brain metastases ([place]) 08/27/2020  . Malignant neoplasm of lung ([place])   . Orthostatic hypotension   . Syncope 07/09/2020  . Slurred speech 07/08/2020  . Uncontrolled type 2 diabetes mellitus with hyperglycemia, with long-term current use of insulin ([place]) 07/08/2020  . Unsteady gait when walking 07/07/2020  . Small cell lung cancer, right upper lobe ([place]) 01/19/2020  . Encounter for  antineoplastic chemotherapy 01/19/2020  . Encounter for antineoplastic immunotherapy 01/19/2020  . Extensive stage primary small cell carcinoma of lung ([place]) 01/04/2020  . Goals of care, counseling/discussion 01/04/2020  . Mediastinal adenopathy   . Mass of upper lobe of right lung   . Brain mass 12/23/2019  . Vasogenic cerebral edema ([place]) 12/23/2019  . Left leg paresthesias   . History of ischemic right MCA stroke   . Acute on chronic systolic congestive heart failure ([place])   . Left-sided weakness   . 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[place])   .  Cocaine abuse ([place]) 01/24/2015  . Essential hypertension 01/24/2015  . Left hemiplegia ([place]) 01/24/2015  . Hyperlipidemia associated with type 2 diabetes mellitus ([place]) 11/30/2014  . Chronic systolic heart failure ([place]) 06/20/2014  . Automatic implantable cardioverter-defibrillator in situ   . Long term (current) use of anticoagulants 08/09/2013  . Drug abuse and dependence ([place])   . Cardiomyopathy, ischemic 07/21/2011  . Type II diabetes mellitus with ophthalmic manifestations ([place]) 09/13/2007    Past Surgical History:  Procedure Laterality Date  . BRONCHIAL BRUSHINGS  12/28/2019   Procedure: BRONCHIAL BRUSHINGS;  Surgeon: Garner Nash, DO;  Location: [facility];  Service: Thoracic;;  . BRONCHIAL NEEDLE ASPIRATION BIOPSY  12/28/2019   Procedure: BRONCHIAL NEEDLE ASPIRATION BIOPSIES;  Surgeon: Garner Nash, DO;  Location: [facility];  Service: Thoracic;;  . BRONCHIAL WASHINGS  12/28/2019   Procedure: BRONCHIAL WASHINGS;  Surgeon: Garner Nash, DO;  Location: [facility];  Service: Thoracic;;  . CARDIAC CATHETERIZATION  05/2014  . CARDIAC DEFIBRILLATOR PLACEMENT  03/2011  . CHOLECYSTECTOMY  1994  . FOREARM FRACTURE SURGERY Left 1980  . FRACTURE SURGERY    . IR IMAGING GUIDED PORT INSERTION  10/11/2020  . RIGHT HEART CATHETERIZATION N/A 05/30/2014   Procedure: RIGHT HEART CATH;  Surgeon: Jolaine Artist, MD;  Location: [facility] CATH LAB;  Service: Cardiovascular;  Laterality: N/A;  . RIGHT HEART CATHETERIZATION N/A 02/07/2015   Procedure: RIGHT HEART CATH;  Surgeon: Jolaine Artist, MD;  Location[facility] CATH LAB;  Service: Cardiovascular;  Laterality: N/A;  . TONSILLECTOMY AND ADENOIDECTOMY  ~ 1998  . VIDEO BRONCHOSCOPY WITH ENDOBRONCHIAL ULTRASOUND N/A 12/28/2019   Procedure: VIDEO BRONCHOSCOPY WITH ENDOBRONCHIAL ULTRASOUND;  Surgeon: Garner Nash, DO;  Location: [facility] ENDOSCOPY;  Service: Thoracic;  Laterality: N/A;       Family History  Problem Relation Age of Onset  .  Diabetes Mother   . Heart disease Mother   . Diabetes Father   . Prostate cancer Father   . Heart disease Father   . Diabetes Brother   . Diabetes Brother     Social History   Tobacco Use  . Smoking status: Former Smoker    Packs/day: 0.25    Years: 31.00    Pack years: 7.75    Types: Cigarettes  . Smokeless tobacco: Never Used  Vaping Use  . Vaping Use: Never used  Substance Use Topics  . Alcohol use: Not Currently    Alcohol/week: 0.0 standard drinks    Comment: 09/21/2014 "might have a drink q 6 /months, if that"  . Drug use: Yes    Types: Cocaine    Comment: last use 07/06/2020    Home Medications Prior to Admission medications   Medication Sig Start Date End Date Taking? Authorizing Provider  Accu-Chek Softclix Lancets lancets  02/03/20   [provider]  atorvastatin (LIPITOR) 20 MG tablet TAKE 1 TABLET BY MOUTH DAILY AT 6 PM. 03/11/17  Bensimhon, Shaune Pascal, MD  Blood Glucose Monitoring Suppl (FIFTY50 GLUCOSE METER 2.0) w/Device KIT Use as instructed 02/03/20   [provider]  dexamethasone (DECADRON) 2 MG tablet Take 1 tablet (2 mg total) by mouth daily. 08/27/20   Ventura Sellers, MD  feeding supplement (ENSURE ENLIVE / ENSURE PLUS) LIQD Take 237 mLs by mouth 2 (two) times daily between meals. 07/26/20   Eugenie Filler, MD  insulin glargine (LANTUS SOLOSTAR) 100 UNIT/ML Solostar Pen Inject 25 Units into the skin 2 (two) times daily. 07/25/20   Eugenie Filler, MD  insulin lispro (HUMALOG KWIKPEN) 100 UNIT/ML KwikPen Inject 6 Units into the skin 3 (three) times daily. 07/25/20   Eugenie Filler, MD  INSUPEN ULTRAFIN 31G X 8 MM [place]  03/02/20   [provider]  lidocaine-prilocaine (EMLA) cream Apply 1 application topically as needed. 09/24/20   Heilingoetter, Cassandra L, PA-C  midodrine (PROAMATINE) 10 MG tablet Take 1 tablet (10 mg total) by mouth 3 (three) times daily with meals. 07/25/20   Eugenie Filler, MD   sennosides-docusate sodium (SENOKOT-S) 8.6-50 MG tablet Take 1 tablet by mouth daily. As needed    [provider]  sodium chloride 1 g tablet Take 2 tablets (2 g total) by mouth 3 (three) times daily with meals. 07/25/20   Eugenie Filler, MD  tamsulosin (FLOMAX) 0.4 MG CAPS capsule Take 0.4 mg by mouth daily. 10/22/20   [provider]  warfarin (COUMADIN) 7.5 MG tablet TAKE 1 TABLET BY MOUTH DAILY AT 6PM. Patient taking differently: Take 7.5 mg by mouth every evening. 01/20/20   Bensimhon, Shaune Pascal, MD    Allergies    Patient has no known allergies.  Review of Systems   Review of Systems  All other systems reviewed and are negative.   Physical Exam Updated Vital Signs BP (!) 130/98   Pulse 99   Temp 98.1 F (36.7 C) (Oral)   Resp (!) 26   SpO2 96%   Physical Exam Vitals and nursing note reviewed. Exam conducted with a chaperone present.  Constitutional:      Appearance: Normal appearance.  HENT:     Head: Normocephalic and atraumatic.     Comments: No evidence of trauma or injury. Eyes:     General: No scleral icterus.    Extraocular Movements: Extraocular movements intact.     Conjunctiva/sclera: Conjunctivae normal.     Pupils: Pupils are equal, round, and reactive to light.  Cardiovascular:     Rate and Rhythm: Normal rate.     Pulses: Normal pulses.  Pulmonary:     Effort: Pulmonary effort is normal. No respiratory distress.  Abdominal:     General: Abdomen is flat. There is no distension.     Palpations: Abdomen is soft.     Tenderness: There is no abdominal tenderness. There is no guarding.     Comments: No overlying skin changes.  Musculoskeletal:     Cervical back: Normal range of motion and neck supple. No rigidity.     Comments: Left leg: No significant TTP over hip, knee, or ankle.  There is deformity noted to distal femur laterally.  Swelling appreciated.  Compartments are soft.  Pedal pulses intact and symmetric.  Sensation grossly  intact throughout.  Plantar flexion and dorsiflexion with strength intact against resistance.  Skin:    General: Skin is dry.  Neurological:     Mental Status: He is alert and oriented to person, place, and time.  GCS: GCS eye subscore is 4. GCS verbal subscore is 5. GCS motor subscore is 6.     Sensory: No sensory deficit.  Psychiatric:        Mood and Affect: Mood normal.        Behavior: Behavior normal.        Thought Content: Thought content normal.     ED Results / Procedures / Treatments   Labs (all labs ordered are listed, but only abnormal results are displayed) Labs Reviewed  RESP PANEL BY RT-PCR (FLU A&B, COVID) ARPGX2 - Abnormal; Notable for the following components:      Result Value   SARS Coronavirus 2 by RT PCR POSITIVE (*)    All other components within normal limits  COMPREHENSIVE METABOLIC PANEL - Abnormal; Notable for the following components:   Glucose, Bld 294 (*)    Creatinine, Ser 1.41 (*)    Total Protein 5.8 (*)    Albumin 2.8 (*)    GFR, Estimated 58 (*)    All other components within normal limits  CBC - Abnormal; Notable for the following components:   RBC 4.01 (*)    Hemoglobin 11.9 (*)    All other components within normal limits  PROTIME-INR - Abnormal; Notable for the following components:   Prothrombin Time 21.1 (*)    INR 1.9 (*)    All other components within normal limits  URINALYSIS, ROUTINE W REFLEX MICROSCOPIC  TYPE AND SCREEN    EKG None  Radiology DG Pelvis Portable  Result Date: 10/30/2020 CLINICAL DATA:  Fall EXAM: PORTABLE PELVIS 1-2 VIEWS COMPARISON:  CT PET of January 17, 2020 FINDINGS: Degenerative changes about the bilateral hips. Irregularity along the iliopectineal line on the LEFT.  Osteopenia. No displaced fractures. IMPRESSION: 1. Irregularity along the LEFT iliopectineal line, suspicious for nondisplaced fracture about the LEFT acetabulum. 2. Degenerative changes about the hips. Electronically Signed   By: Zetta Bills M.D.   On: 10/30/2020 13:03   DG Tibia/Fibula Left Port  Result Date: 10/30/2020 CLINICAL DATA:  Fall, deformity of the knee. EXAM: PORTABLE LEFT TIBIA AND FIBULA - 2 VIEW COMPARISON:  Femoral and pelvic evaluation of the same date. FINDINGS: Overlapping stabilization device limiting assessment. No signs of fracture of the tibia or fibula. No joint effusion noted incidentally in the knee. Incidental note made of mid to distal femoral fracture at the upper margin of the submitted radiographs. Question of medial patellar subluxation versus is artifact from oblique nature of the submitted image. This could also be related to the severity of the fracture in the femur above. IMPRESSION: 1. No signs of fracture of the tibia or fibula. 2. Incidental note is made of mid to distal femoral fracture at the upper margin of the submitted radiographs. 3. Question medial patellar subluxation versus artifact from oblique nature of the submitted image. Electronically Signed   By: Zetta Bills M.D.   On: 10/30/2020 13:06   DG FEMUR PORT 1V LEFT  Result Date: 10/30/2020 CLINICAL DATA:  Fall, deformity about the knee. EXAM: LEFT FEMUR PORTABLE 1 VIEW COMPARISON:  None FINDINGS: Distal femoral diaphyseal spiral fracture with butterfly fragment and a half shaft with medial displacement of the distal fracture fragment, mild apex medial angulation and moderate w[place]x anterior angulation at the fracture site. Complete displacement and over riding noted on the lateral view, distal fracture fragment dorsally displaced relative to proximal femur. On lateral view dorsal displacement of fracture fragments are in close proximity to calcified femoral vasculature.  Lateral view of the LEFT hip/proximal femur with limited evaluation due to overlap of stabilization device. IMPRESSION: Displaced and angulated spiral fracture of the distal femoral diaphysis as described. Limited evaluation of the LEFT hip/proximal femur due to overlap of  stabilization device. Markedly displaced mid to distal femoral fracture is in close proximity to calcified femoral vasculature. Consider CT for vascular assessment as clinically warranted. Electronically Signed   By: Zetta Bills M.D.   On: 10/30/2020 12:56    Procedures Procedures (including critical care time)  Medications Ordered in ED Medications  phytonadione (VITAMIN K) 2.5 mg in dextrose 5 % 50 mL IVPB (has no administration in time range)    ED Course  I have reviewed the triage vital signs and the nursing notes.  Pertinent labs & imaging results that were available during my care of the patient were reviewed by me and considered in my medical decision making (see chart for details).  Clinical Course as of 10/30/20 1515  Tue Oct 30, 2020  1323 I spoke with Silvestre Gunner, PA.  He will come see the patient.  Given his INR is only 1.9, may be able to arrange for OR later this evening.  Keep NPO and he will come see patient.  Given complex medical history, he will require hospital admit.   [GG]  9509 I spoke with Dr. Tamala Julian who will see and admit patient. [GG]    Clinical Course User Index [GG] Corena Herter, PA-C   MDM Rules/Calculators/A&P                           Tate Chanz Cahall. was evaluated in Emergency Department on 10/30/2020 for the symptoms described in the history of present illness. He was evaluated in the context of the global COVID-19 pandemic, which necessitated consideration that the patient might be at risk for infection with the SARS-CoV-2 virus that causes COVID-19. Institutional protocols and algorithms that pertain to the evaluation of patients at risk for COVID-19 are in a state of rapid change based on information released by regulatory bodies including the CDC and federal and state organizations. These policies and algorithms were followed during the patient's care in the ED.  I personally reviewed patient's medical chart and all notes from triage and  staff during today's encounter. I have also ordered and reviewed all labs and imaging that I felt to be medically necessary in the evaluation of this patient's complaints and with consideration of their with their physical exam. If needed, translation services were available and utilized.   Concern for femur injury.  We will proceed with imaging and basic laboratory work-up including PT/INR.  I personally reviewed laboratory work-up and imaging.  Notable for displaced and angulated spiral fracture of the distal femoral diaphysis, close proximity to calcified femoral vasculature.  Consider CT for vascular assessment if clinically warranted.  There is also an irregularity along the left iliopectineal line suspicious for nondisplaced fracture about the left acetabulum.  I spoke with Silvestre Gunner, PA.  He will come see the patient.  Given his INR is only 1.9, may be able to arrange for OR later this evening.  Keep NPO and he will come see patient.  Given complex medical history, he will require hospital admit.    Patient incidentally found to be COVID-19 positive.  He was already going to be a medical admit given his complex medical history including metastatic lung cancer, substance use disorder, and IDDM.  Plan for surgery tomorrow.  Hold coumadin, NPO after midnight.    I spoke with Dr. Tamala Julian who will see and admit patient.   Final Clinical Impression(s) / ED Diagnoses Final diagnoses:  Fall  Type I or II open fracture of distal end of left femur, unspecified fracture morphology, initial encounter [facility])    Rx / [place] Orders ED Discharge Orders    None       Corena Herter, PA-C 10/30/20 1515    Corena Herter, PA-C 10/30/20 1516    Philip, Alvin Critchley, DO 10/31/20 [phone]

## 2020-10-31 ENCOUNTER — Inpatient Hospital Stay (HOSPITAL_COMMUNITY): Payer: Medicare Other | Admitting: Anesthesiology

## 2020-10-31 ENCOUNTER — Inpatient Hospital Stay (HOSPITAL_COMMUNITY): Payer: Medicare Other

## 2020-10-31 ENCOUNTER — Encounter (HOSPITAL_COMMUNITY)
Admission: EM | Disposition: A | Discharge status: Home Health | Payer: Self-pay | Source: Home / Self Care | Attending: Internal Medicine

## 2020-10-31 ENCOUNTER — Encounter (HOSPITAL_COMMUNITY): Payer: Self-pay | Admitting: Internal Medicine

## 2020-10-31 DIAGNOSIS — I5022 Chronic systolic (congestive) heart failure: Secondary | ICD-10-CM | POA: Diagnosis not present

## 2020-10-31 DIAGNOSIS — S72402A Unspecified fracture of lower end of left femur, initial encounter for closed fracture: Secondary | ICD-10-CM | POA: Diagnosis not present

## 2020-10-31 DIAGNOSIS — Z95810 Presence of automatic (implantable) cardiac defibrillator: Secondary | ICD-10-CM

## 2020-10-31 DIAGNOSIS — W19XXXA Unspecified fall, initial encounter: Secondary | ICD-10-CM

## 2020-10-31 DIAGNOSIS — U071 COVID-19: Secondary | ICD-10-CM | POA: Diagnosis not present

## 2020-10-31 DIAGNOSIS — C7931 Secondary malignant neoplasm of brain: Secondary | ICD-10-CM | POA: Diagnosis not present

## 2020-10-31 HISTORY — PX: FEMUR IM NAIL: SHX1597

## 2020-10-31 LAB — CBC WITH DIFFERENTIAL/PLATELET
Abs Immature Granulocytes: 0.07 10*3/uL (ref 0.00–0.07)
Basophils Absolute: 0 10*3/uL (ref 0.0–0.1)
Basophils Relative: 0 %
Eosinophils Absolute: 0.2 10*3/uL (ref 0.0–0.5)
Eosinophils Relative: 2 %
HCT: 35.9 % — ABNORMAL LOW (ref 39.0–52.0)
Hemoglobin: 10.8 g/dL — ABNORMAL LOW (ref 13.0–17.0)
Immature Granulocytes: 1 %
Lymphocytes Relative: 11 %
Lymphs Abs: 1.3 10*3/uL (ref 0.7–4.0)
MCH: 30 pg (ref 26.0–34.0)
MCHC: 30.1 g/dL (ref 30.0–36.0)
MCV: 99.7 fL (ref 80.0–100.0)
Monocytes Absolute: 0.7 10*3/uL (ref 0.1–1.0)
Monocytes Relative: 6 %
Neutro Abs: 9.3 10*3/uL — ABNORMAL HIGH (ref 1.7–7.7)
Neutrophils Relative %: 80 %
Platelets: 170 10*3/uL (ref 150–400)
RBC: 3.6 MIL/uL — ABNORMAL LOW (ref 4.22–5.81)
RDW: 14.4 % (ref 11.5–15.5)
WBC: 11.7 10*3/uL — ABNORMAL HIGH (ref 4.0–10.5)
nRBC: 0 % (ref 0.0–0.2)

## 2020-10-31 LAB — FERRITIN: Ferritin: 158 ng/mL (ref 24–336)

## 2020-10-31 LAB — PROTIME-INR
INR: 1.4 — ABNORMAL HIGH (ref 0.8–1.2)
Prothrombin Time: 16.8 seconds — ABNORMAL HIGH (ref 11.4–15.2)

## 2020-10-31 LAB — COMPREHENSIVE METABOLIC PANEL
ALT: 22 U/L (ref 0–44)
AST: 18 U/L (ref 15–41)
Albumin: 2.5 g/dL — ABNORMAL LOW (ref 3.5–5.0)
Alkaline Phosphatase: 91 U/L (ref 38–126)
Anion gap: 10 (ref 5–15)
BUN: 16 mg/dL (ref 6–20)
CO2: 19 mmol/L — ABNORMAL LOW (ref 22–32)
Calcium: 8.5 mg/dL — ABNORMAL LOW (ref 8.9–10.3)
Chloride: 108 mmol/L (ref 98–111)
Creatinine, Ser: 1.15 mg/dL (ref 0.61–1.24)
GFR, Estimated: >60 mL/min (ref >60)
Glucose, Bld: 241 mg/dL — ABNORMAL HIGH (ref 70–99)
Potassium: 3.9 mmol/L (ref 3.5–5.1)
Sodium: 137 mmol/L (ref 135–145)
Total Bilirubin: 0.9 mg/dL (ref 0.3–1.2)
Total Protein: 5.4 g/dL — ABNORMAL LOW (ref 6.5–8.1)

## 2020-10-31 LAB — VITAMIN D 25 HYDROXY (VIT D DEFICIENCY, FRACTURES): Vit D, 25-Hydroxy: 12.78 ng/mL — ABNORMAL LOW (ref 30–100)

## 2020-10-31 LAB — PHOSPHORUS: Phosphorus: 3.4 mg/dL (ref 2.5–4.6)

## 2020-10-31 LAB — D-DIMER, QUANTITATIVE: D-Dimer, Quant: 2.02 ug/mL-FEU — ABNORMAL HIGH (ref 0.00–0.50)

## 2020-10-31 LAB — MAGNESIUM: Magnesium: 1.6 mg/dL — ABNORMAL LOW (ref 1.7–2.4)

## 2020-10-31 LAB — C-REACTIVE PROTEIN: CRP: 2.7 mg/dL — ABNORMAL HIGH (ref <1.0)

## 2020-10-31 LAB — GLUCOSE, CAPILLARY
Glucose-Capillary: 261 mg/dL — ABNORMAL HIGH (ref 70–99)
Glucose-Capillary: 155 mg/dL — ABNORMAL HIGH (ref 70–99)
Glucose-Capillary: 161 mg/dL — ABNORMAL HIGH (ref 70–99)
Glucose-Capillary: 301 mg/dL — ABNORMAL HIGH (ref 70–99)

## 2020-10-31 IMAGING — DX DG FEMUR 2+V PORT*L*
4 series · 4 of 4 positions shown · non-contrast
Comparison: Radiographs, same date.

CLINICAL DATA: Postop fixation of a mid distal femur fracture.

EXAM:
LEFT FEMUR PORTABLE 2 VIEWS

[femur ap (1 of 2)]
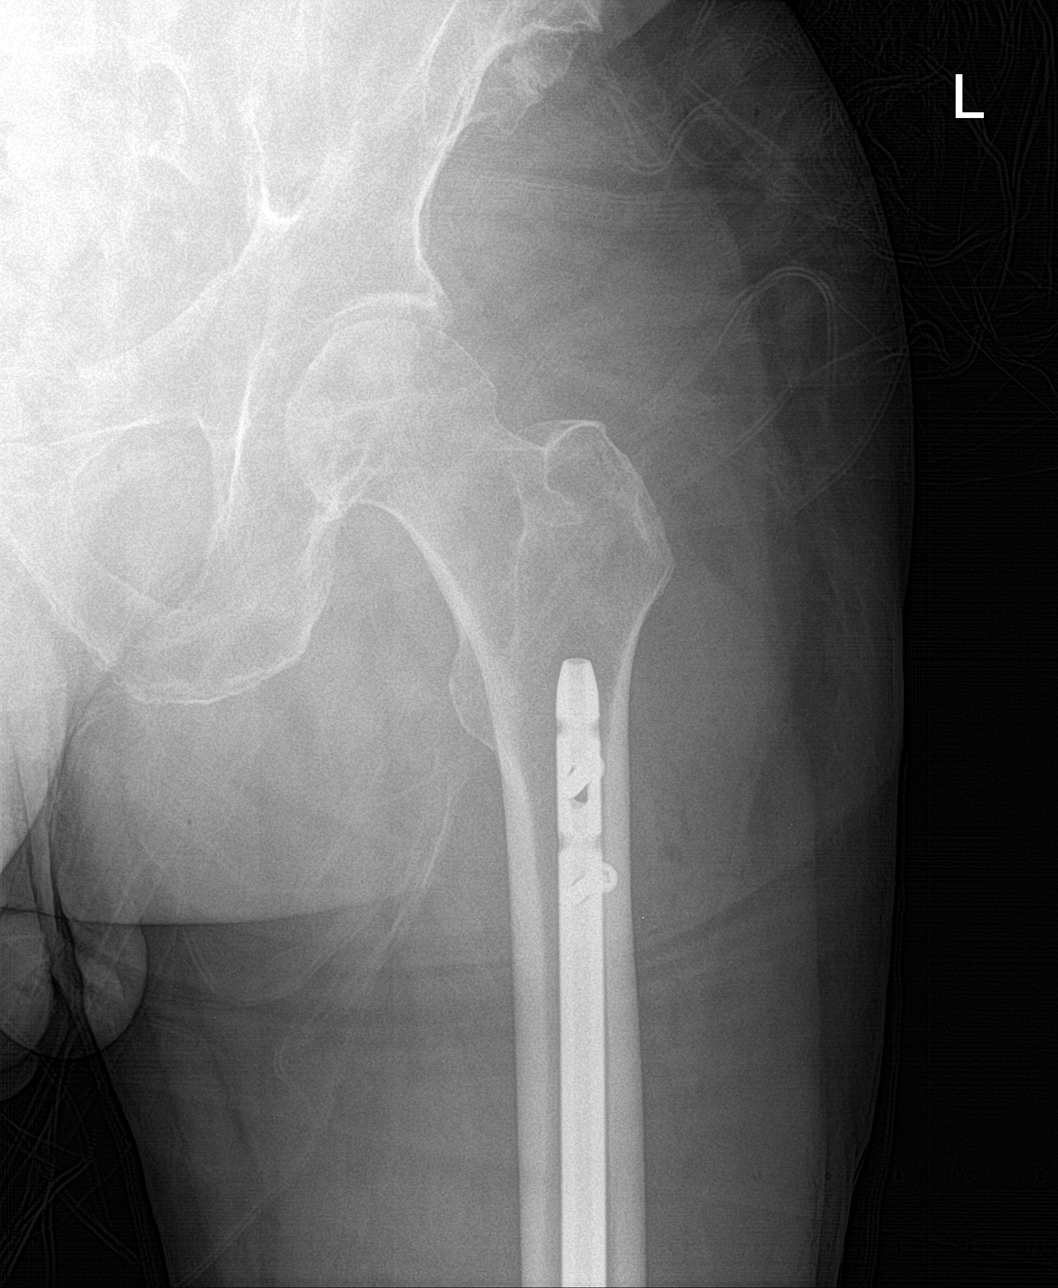

[femur lat (1 of 2)]
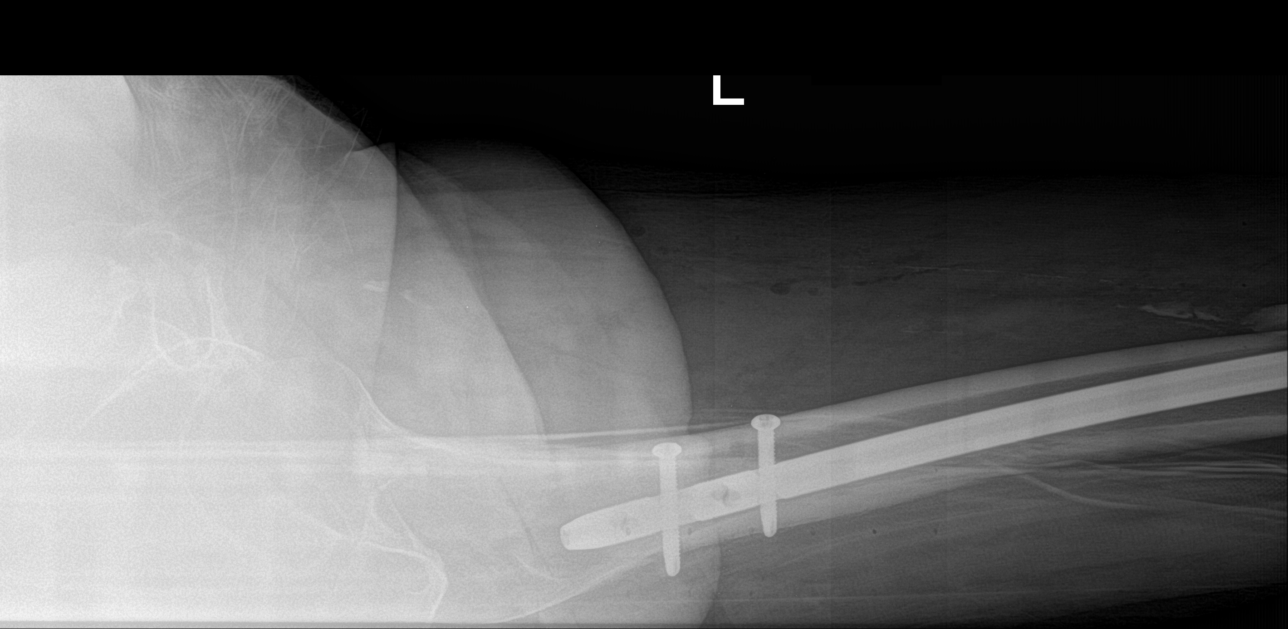

[femur lat (2 of 2)]
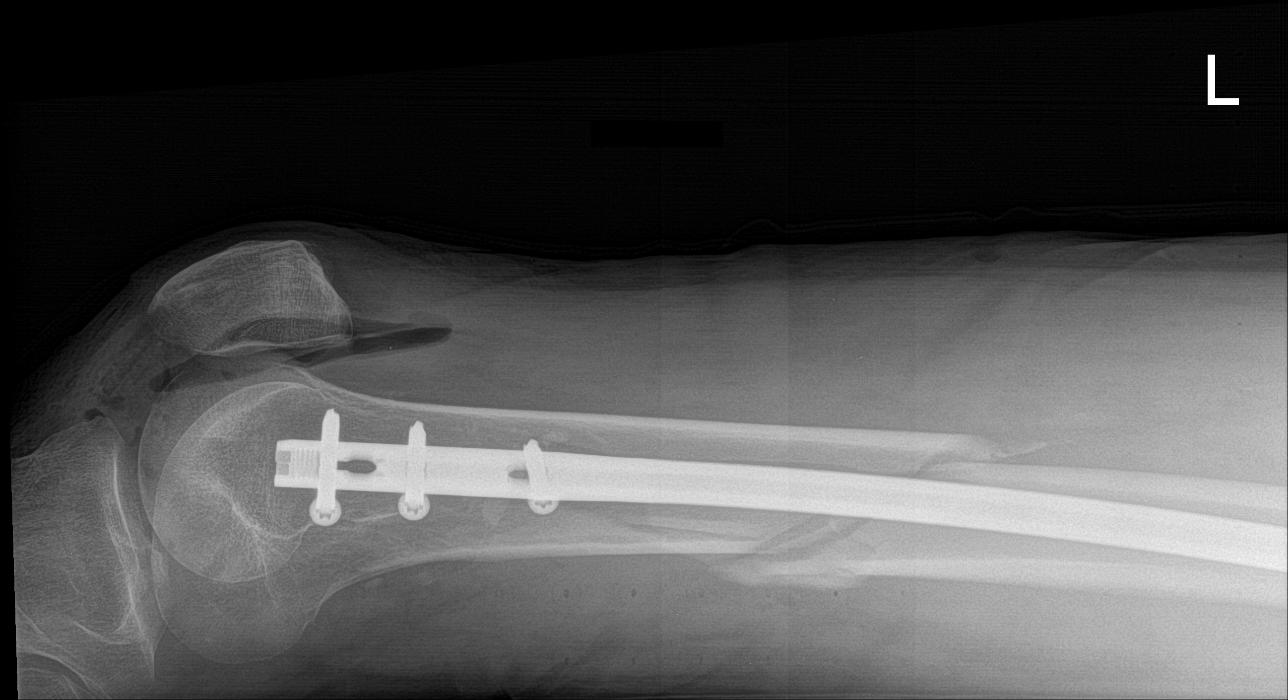

[femur ap (2 of 2)]
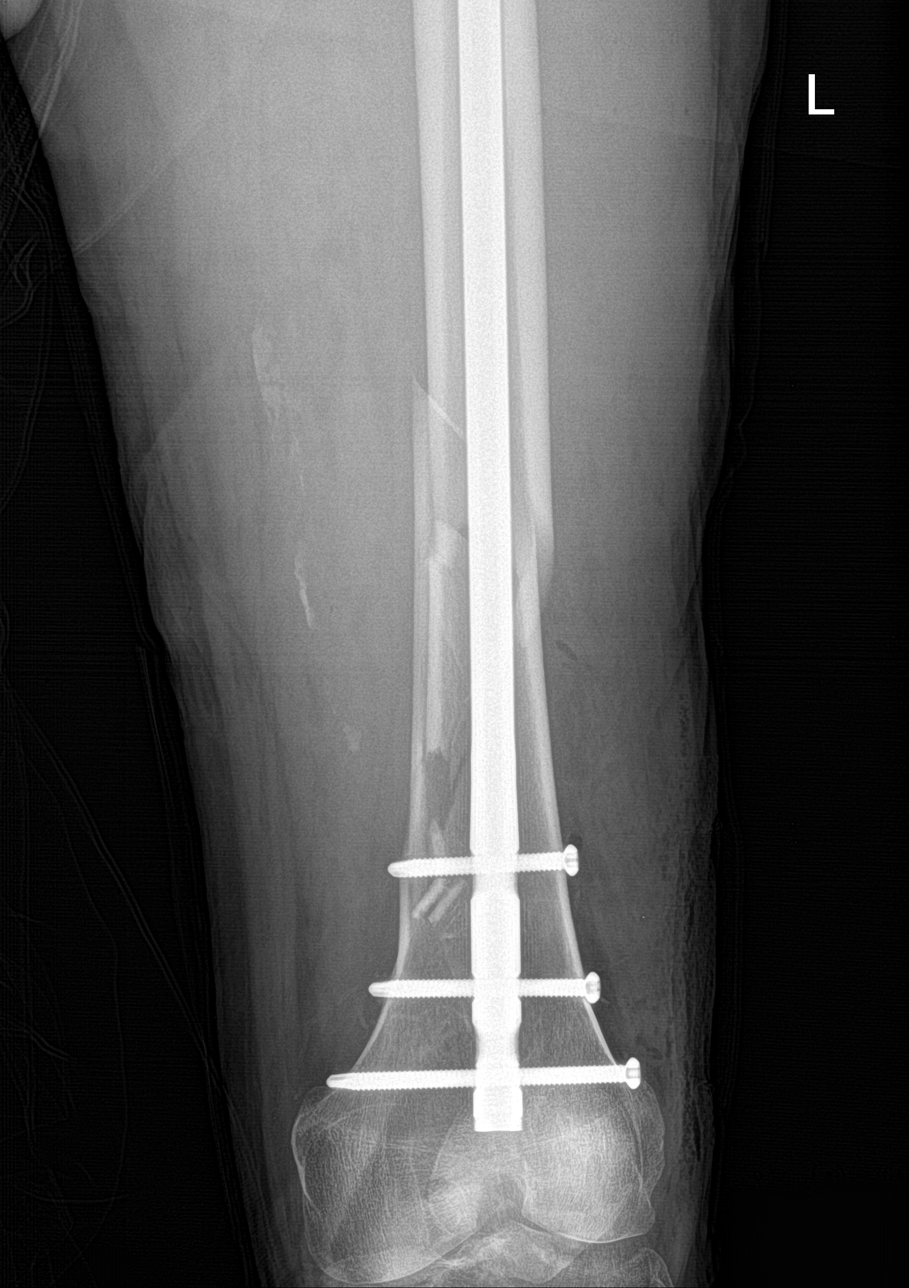

[4 of 4 positions shown; findings below may reference images not displayed]

FINDINGS: There is retrograde intramedullary rod in the femur with 2 proximal
and 3 distal interlocking screws transfixing the complex comminuted
femur fracture which is in good position and alignment.
IMPRESSION: Internal fixation and near anatomic alignment of a complex
comminuted femur fracture.

## 2020-10-31 IMAGING — RF DG FEMUR 2+V*L*
1 series · 8 of 8 positions shown · non-contrast
Comparison: Radiographs from [DATE]

CLINICAL DATA: Left femoral nail placement.

EXAM:
DG C-ARM 1-60 MIN; LEFT FEMUR 2 VIEWS
FLUOROSCOPY TIME:  Fluoroscopy Time:  2 minutes and 52 seconds
Reported radiation: 18 mGy.

[Series 1: run · 8 of 8 slices shown]
[im 1/8]
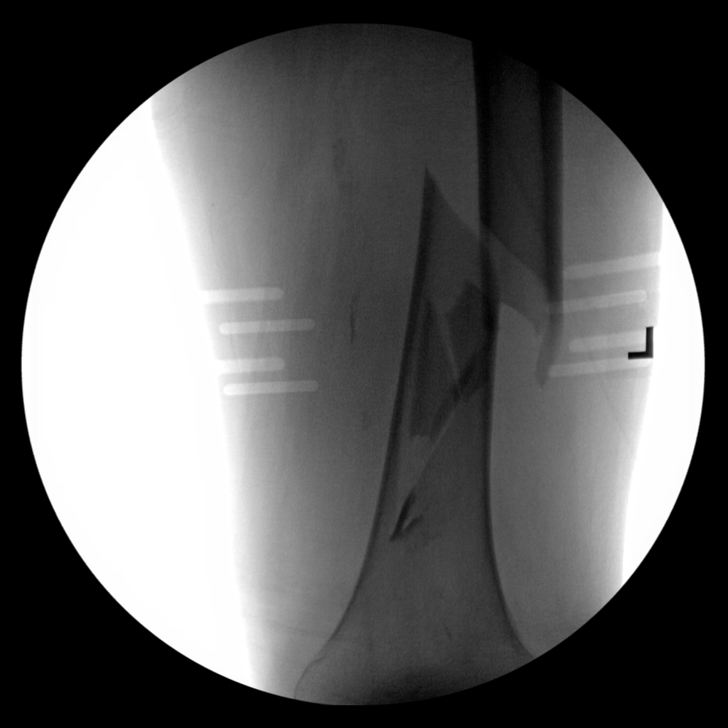
[im 2/8]
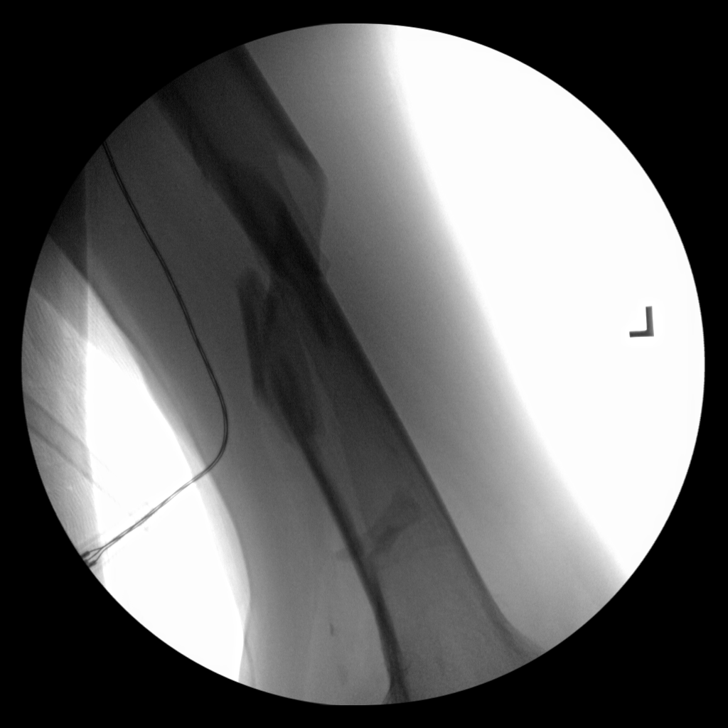
[im 3/8]
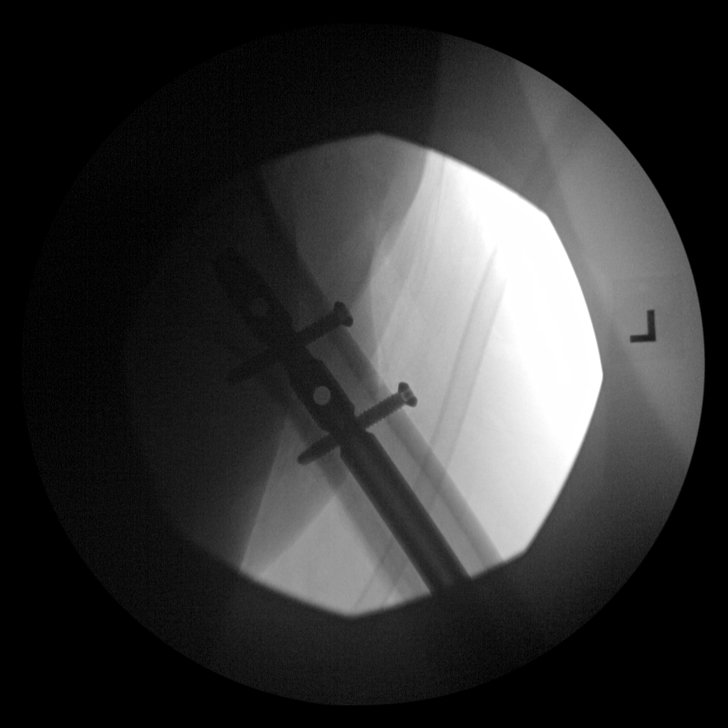
[im 4/8]
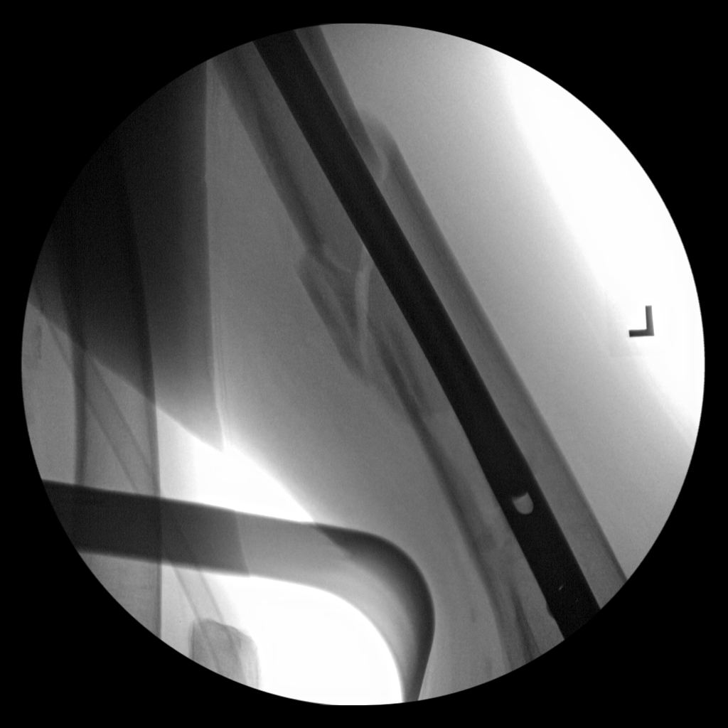
[im 5/8]
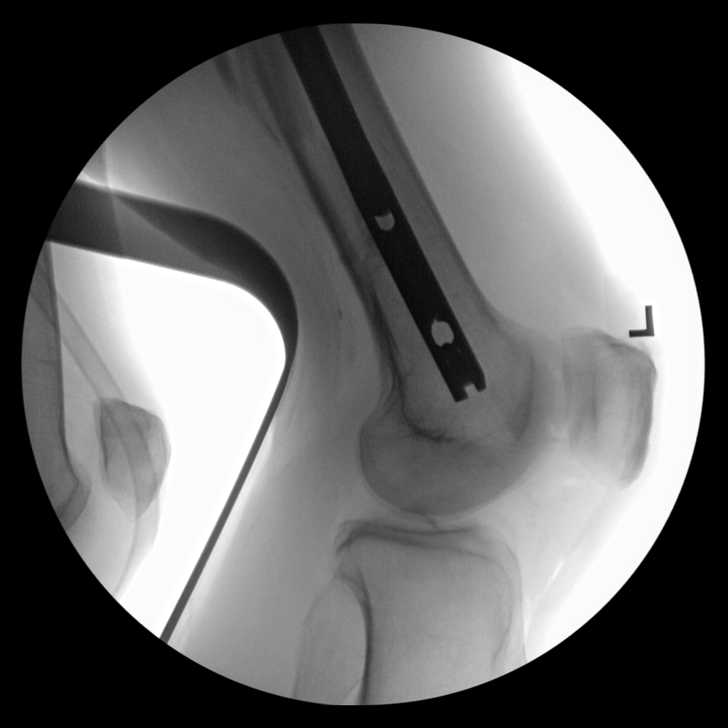
[im 6/8]
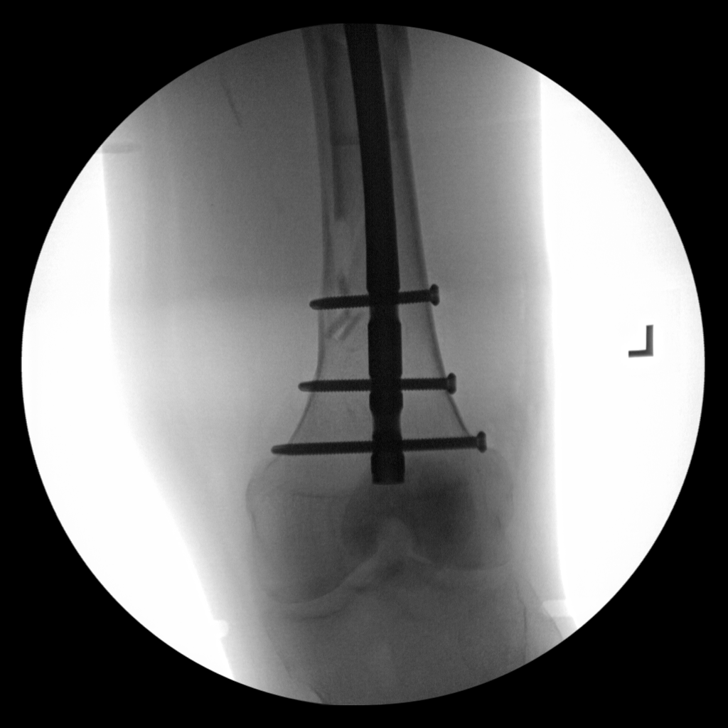
[im 7/8]
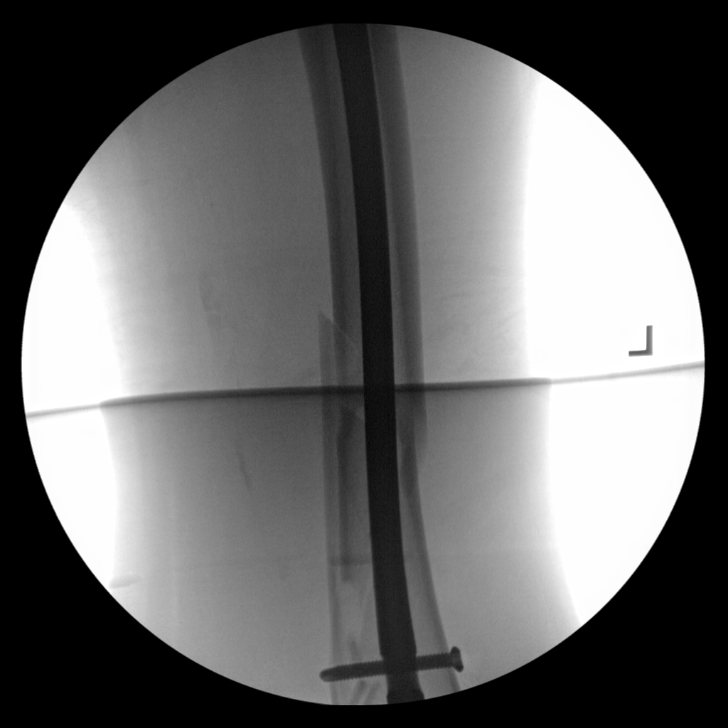
[im 8/8]
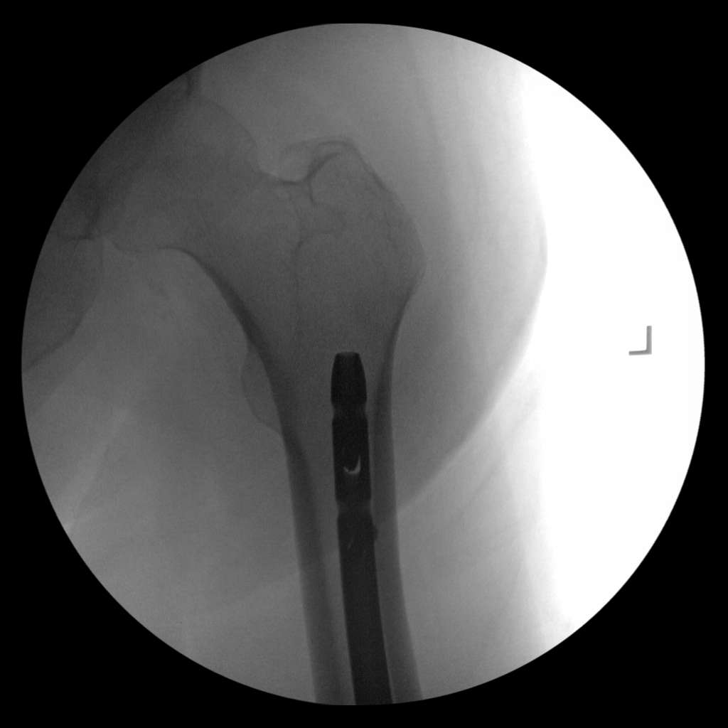

[8 of 8 positions shown; findings below may reference images not displayed]

FINDINGS: Eight C-arm fluoroscopic images were obtained intraoperatively and
submitted for post operative interpretation. These images
demonstrate intramedullary nail and screw fixation of a spiral
fracture of the distal femoral diaphysis. Improved alignment. No
unexpected findings. Please see the performing provider's procedural
report for further detail.
IMPRESSION: Intraoperative fluoroscopic imaging, as detailed above.

## 2020-10-31 IMAGING — RF DG C-ARM 1-60 MIN
1 series · 8 of 8 positions shown · non-contrast
Comparison: Radiographs from [DATE]

CLINICAL DATA: Left femoral nail placement.

EXAM:
DG C-ARM 1-60 MIN; LEFT FEMUR 2 VIEWS
FLUOROSCOPY TIME:  Fluoroscopy Time:  2 minutes and 52 seconds
Reported radiation: 18 mGy.

[Series 1: run · 8 of 8 slices shown]
[im 1/8]
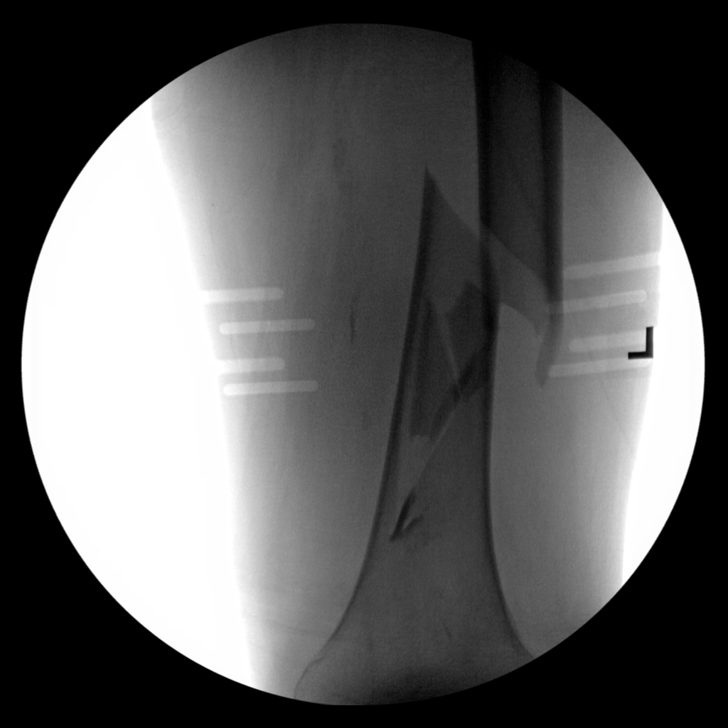
[im 2/8]
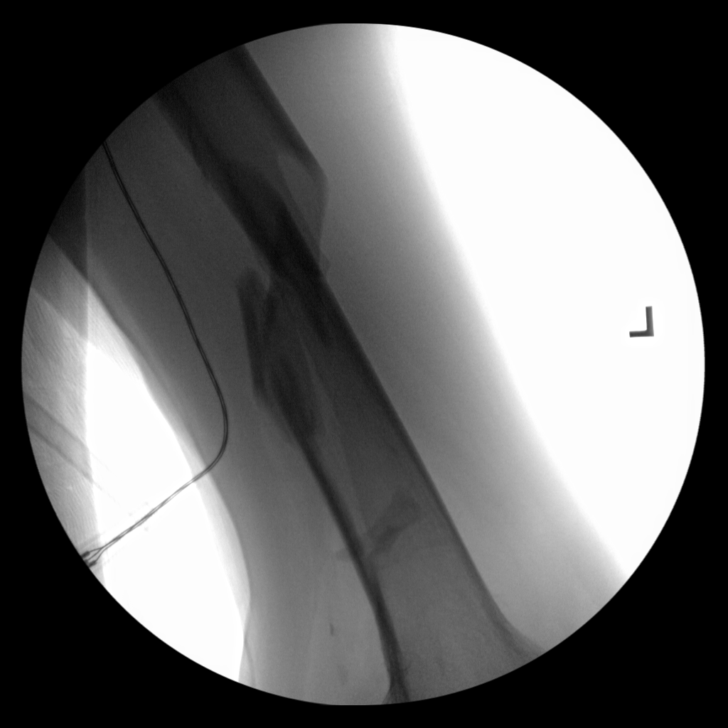
[im 3/8]
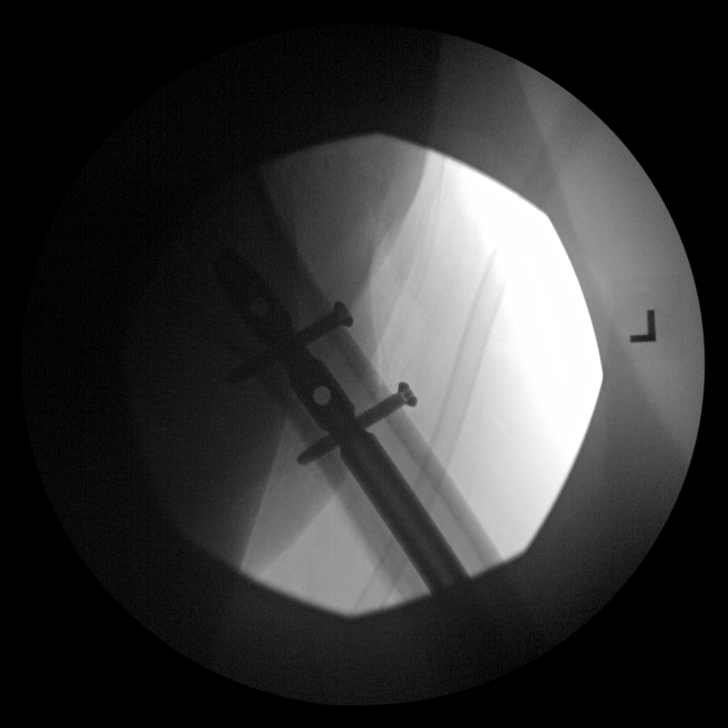
[im 4/8]
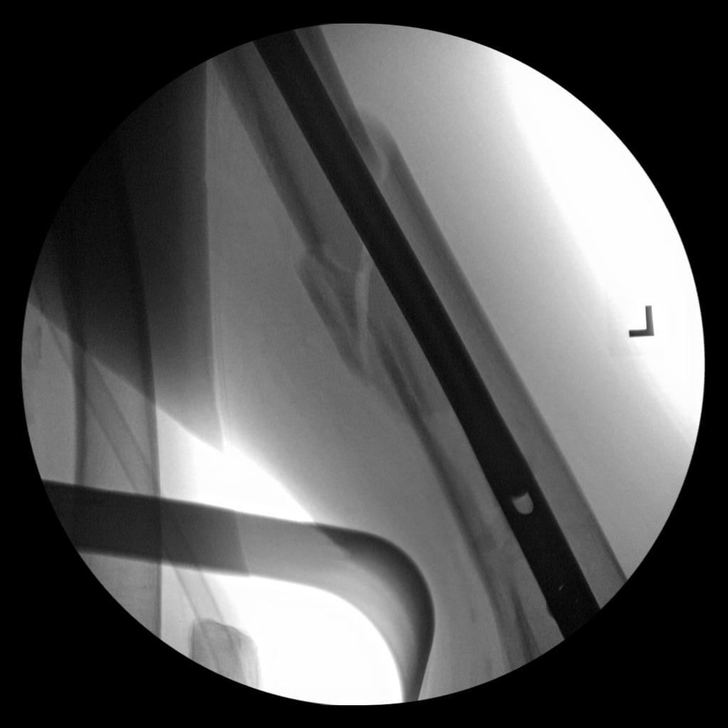
[im 5/8]
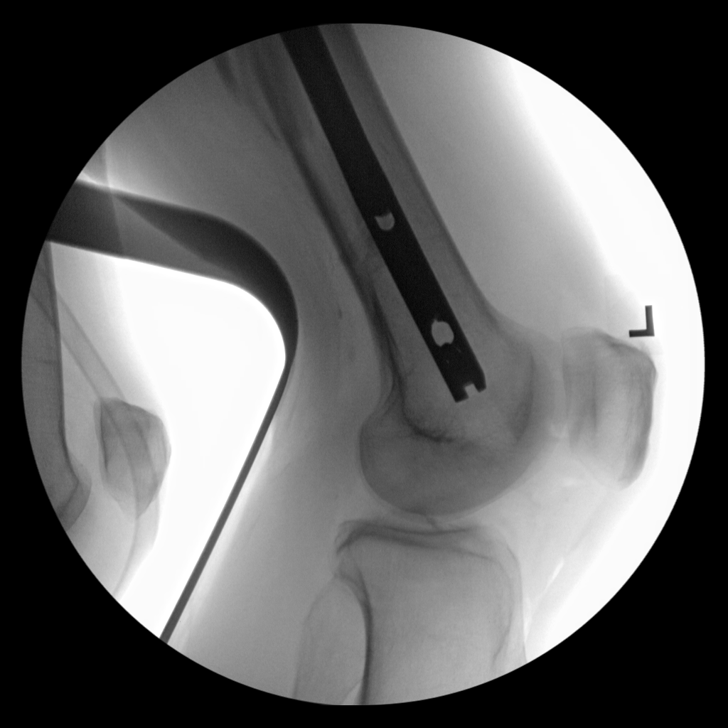
[im 6/8]
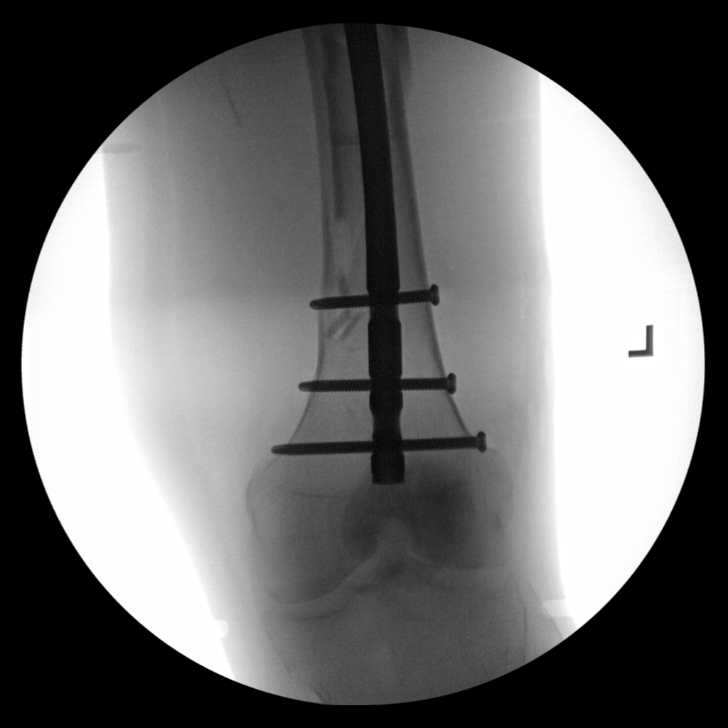
[im 7/8]
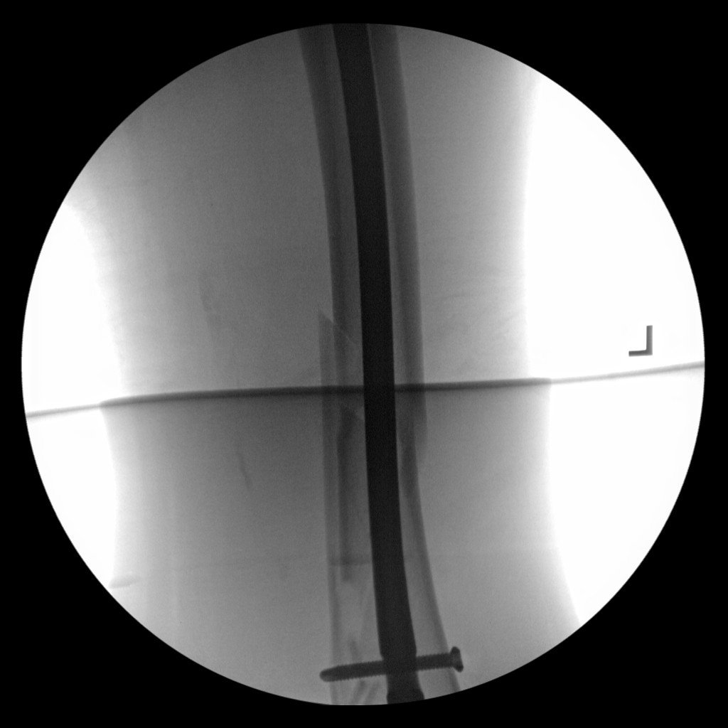
[im 8/8]
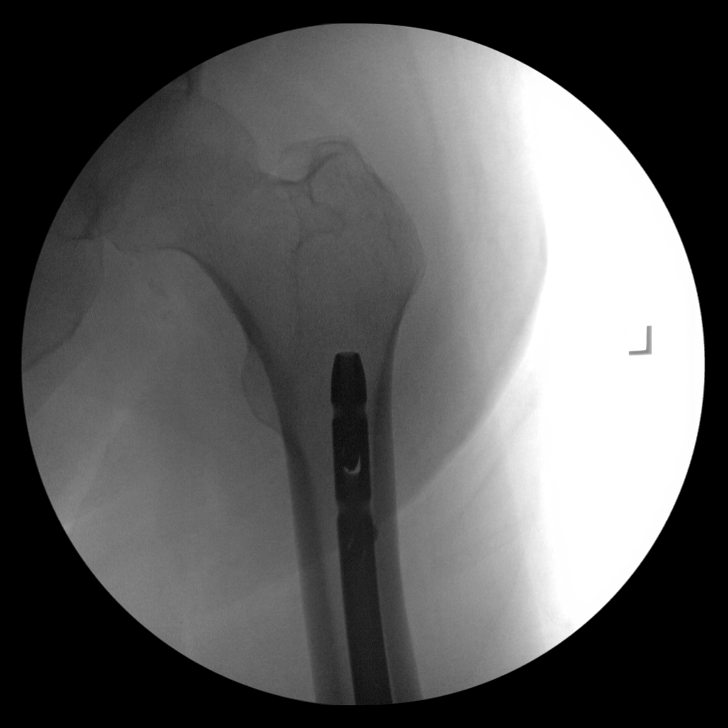

[8 of 8 positions shown; findings below may reference images not displayed]

FINDINGS: Eight C-arm fluoroscopic images were obtained intraoperatively and
submitted for post operative interpretation. These images
demonstrate intramedullary nail and screw fixation of a spiral
fracture of the distal femoral diaphysis. Improved alignment. No
unexpected findings. Please see the performing provider's procedural
report for further detail.
IMPRESSION: Intraoperative fluoroscopic imaging, as detailed above.

## 2020-10-31 SURGERY — INSERTION, INTRAMEDULLARY ROD, FEMUR, RETROGRADE
Anesthesia: General | Laterality: Left

## 2020-10-31 MED ORDER — DEXAMETHASONE SODIUM PHOSPHATE 10 MG/ML IJ SOLN
4.0000 mg | Freq: Two times a day (BID) | INTRAMUSCULAR | Status: DC
  Administered 2020-10-31 – 2020-11-03 (×6): 4 mg via INTRAVENOUS
  Filled 2020-10-31 (×6): qty 1

## 2020-10-31 MED ORDER — CEFAZOLIN SODIUM 1 G IJ SOLR
INTRAMUSCULAR | Status: AC
  Filled 2020-10-31: qty 20

## 2020-10-31 MED ORDER — SUCCINYLCHOLINE CHLORIDE 200 MG/10ML IV SOSY
PREFILLED_SYRINGE | INTRAVENOUS | Status: AC
  Filled 2020-10-31: qty 10

## 2020-10-31 MED ORDER — VANCOMYCIN HCL 1000 MG IV SOLR
INTRAVENOUS | Status: AC
  Filled 2020-10-31: qty 1000

## 2020-10-31 MED ORDER — ACETAMINOPHEN 325 MG PO TABS
325.0000 mg | ORAL_TABLET | Freq: Four times a day (QID) | ORAL | Status: DC | PRN
Start: 2020-10-31 — End: 2020-11-28
  Filled 2020-10-31: qty 2

## 2020-10-31 MED ORDER — SODIUM CHLORIDE 0.9% FLUSH
10.0000 mL | Freq: Two times a day (BID) | INTRAVENOUS | Status: DC
  Administered 2020-10-31 – 2020-11-27 (×40): 10 mL

## 2020-10-31 MED ORDER — ESMOLOL HCL 100 MG/10ML IV SOLN
INTRAVENOUS | Status: AC
  Filled 2020-10-31: qty 10

## 2020-10-31 MED ORDER — 0.9 % SODIUM CHLORIDE (POUR BTL) OPTIME
TOPICAL | Status: DC | PRN
  Administered 2020-10-31: 1000 mL

## 2020-10-31 MED ORDER — ROCURONIUM BROMIDE 10 MG/ML (PF) SYRINGE
PREFILLED_SYRINGE | INTRAVENOUS | Status: DC | PRN
  Administered 2020-10-31: 30 mg via INTRAVENOUS

## 2020-10-31 MED ORDER — LACTATED RINGERS IV SOLN: INTRAVENOUS | Status: DC | PRN

## 2020-10-31 MED ORDER — METHOCARBAMOL 500 MG PO TABS: 500.0000 mg | ORAL_TABLET | Freq: Four times a day (QID) | ORAL | Status: DC | PRN

## 2020-10-31 MED ORDER — ROCURONIUM BROMIDE 10 MG/ML (PF) SYRINGE
PREFILLED_SYRINGE | INTRAVENOUS | Status: AC
  Filled 2020-10-31: qty 10

## 2020-10-31 MED ORDER — CHLORHEXIDINE GLUCONATE CLOTH 2 % EX PADS
6.0000 | MEDICATED_PAD | Freq: Every day | CUTANEOUS | Status: DC
  Administered 2020-10-31 – 2020-11-28 (×28): 6 via TOPICAL

## 2020-10-31 MED ORDER — ONDANSETRON HCL 4 MG/2ML IJ SOLN
INTRAMUSCULAR | Status: DC | PRN
  Administered 2020-10-31: 4 mg via INTRAVENOUS

## 2020-10-31 MED ORDER — DEXAMETHASONE SODIUM PHOSPHATE 10 MG/ML IJ SOLN
INTRAMUSCULAR | Status: DC | PRN
  Administered 2020-10-31: 10 mg via INTRAVENOUS

## 2020-10-31 MED ORDER — HYDROCODONE-ACETAMINOPHEN 7.5-325 MG PO TABS: 1.0000 | ORAL_TABLET | Freq: Four times a day (QID) | ORAL | Status: DC | PRN

## 2020-10-31 MED ORDER — SUGAMMADEX SODIUM 200 MG/2ML IV SOLN
INTRAVENOUS | Status: DC | PRN
  Administered 2020-10-31: 100 mg via INTRAVENOUS

## 2020-10-31 MED ORDER — VANCOMYCIN HCL 1000 MG IV SOLR
INTRAVENOUS | Status: DC | PRN
  Administered 2020-10-31: 1000 mg via TOPICAL

## 2020-10-31 MED ORDER — ONDANSETRON HCL 4 MG/2ML IJ SOLN
INTRAMUSCULAR | Status: AC
  Filled 2020-10-31: qty 2

## 2020-10-31 MED ORDER — PHENYLEPHRINE 40 MCG/ML (10ML) SYRINGE FOR IV PUSH (FOR BLOOD PRESSURE SUPPORT)
PREFILLED_SYRINGE | INTRAVENOUS | Status: DC | PRN
  Administered 2020-10-31: 120 ug via INTRAVENOUS
  Administered 2020-10-31: 80 ug via INTRAVENOUS
  Administered 2020-10-31: 120 ug via INTRAVENOUS
  Administered 2020-10-31: 80 ug via INTRAVENOUS
  Administered 2020-10-31: 160 ug via INTRAVENOUS

## 2020-10-31 MED ORDER — LIDOCAINE 2% (20 MG/ML) 5 ML SYRINGE
INTRAMUSCULAR | Status: AC
  Filled 2020-10-31: qty 5

## 2020-10-31 MED ORDER — EPHEDRINE SULFATE-NACL 50-0.9 MG/10ML-% IV SOSY
PREFILLED_SYRINGE | INTRAVENOUS | Status: DC | PRN
  Administered 2020-10-31: 15 mg via INTRAVENOUS

## 2020-10-31 MED ORDER — DEXAMETHASONE SODIUM PHOSPHATE 10 MG/ML IJ SOLN
INTRAMUSCULAR | Status: AC
  Filled 2020-10-31: qty 1

## 2020-10-31 MED ORDER — LIDOCAINE 2% (20 MG/ML) 5 ML SYRINGE
INTRAMUSCULAR | Status: DC | PRN
  Administered 2020-10-31: 100 mg via INTRAVENOUS

## 2020-10-31 MED ORDER — ACETAMINOPHEN 10 MG/ML IV SOLN
INTRAVENOUS | Status: DC | PRN
  Administered 2020-10-31: 1000 mg via INTRAVENOUS

## 2020-10-31 MED ORDER — ACETAMINOPHEN 10 MG/ML IV SOLN
INTRAVENOUS | Status: AC
  Filled 2020-10-31: qty 100

## 2020-10-31 MED ORDER — FUROSEMIDE 10 MG/ML IJ SOLN: 20.0000 mg | Freq: Once | INTRAMUSCULAR | Status: DC

## 2020-10-31 MED ORDER — SODIUM CHLORIDE 0.9% FLUSH
10.0000 mL | INTRAVENOUS | Status: DC | PRN
  Administered 2020-11-08 – 2020-11-28 (×4): 10 mL

## 2020-10-31 MED ORDER — HYDROCODONE-ACETAMINOPHEN 5-325 MG PO TABS
1.0000 | ORAL_TABLET | Freq: Four times a day (QID) | ORAL | Status: DC | PRN
  Administered 2020-10-31: 2 via ORAL
  Administered 2020-11-01: 1 via ORAL
  Administered 2020-11-27: 2 via ORAL
  Filled 2020-10-31: qty 1
  Filled 2020-10-31 (×3): qty 2
  Filled 2020-10-31: qty 1

## 2020-10-31 MED ORDER — MORPHINE SULFATE (PF) 2 MG/ML IV SOLN: 0.5000 mg | INTRAVENOUS | Status: DC | PRN

## 2020-10-31 MED ORDER — FENTANYL CITRATE (PF) 250 MCG/5ML IJ SOLN
INTRAMUSCULAR | Status: AC
  Filled 2020-10-31: qty 5

## 2020-10-31 MED ORDER — MIDAZOLAM HCL 5 MG/5ML IJ SOLN
INTRAMUSCULAR | Status: DC | PRN
  Administered 2020-10-31: 2 mg via INTRAVENOUS

## 2020-10-31 MED ORDER — MAGNESIUM SULFATE 2 GM/50ML IV SOLN
2.0000 g | Freq: Once | INTRAVENOUS | Status: DC
  Filled 2020-10-31: qty 50

## 2020-10-31 MED ORDER — MIDAZOLAM HCL 2 MG/2ML IJ SOLN
INTRAMUSCULAR | Status: AC
  Filled 2020-10-31: qty 2

## 2020-10-31 MED ORDER — FENTANYL CITRATE (PF) 100 MCG/2ML IJ SOLN
INTRAMUSCULAR | Status: DC | PRN
  Administered 2020-10-31: 50 ug via INTRAVENOUS
  Administered 2020-10-31: 100 ug via INTRAVENOUS

## 2020-10-31 MED ORDER — SUCCINYLCHOLINE CHLORIDE 200 MG/10ML IV SOSY
PREFILLED_SYRINGE | INTRAVENOUS | Status: DC | PRN
  Administered 2020-10-31: 120 mg via INTRAVENOUS

## 2020-10-31 MED ORDER — PROPOFOL 10 MG/ML IV BOLUS
INTRAVENOUS | Status: DC | PRN
  Administered 2020-10-31: 90 mg via INTRAVENOUS

## 2020-10-31 MED ORDER — CEFAZOLIN SODIUM-DEXTROSE 2-4 GM/100ML-% IV SOLN
2.0000 g | Freq: Three times a day (TID) | INTRAVENOUS | Status: AC
  Administered 2020-10-31 – 2020-11-01 (×3): 2 g via INTRAVENOUS
  Filled 2020-10-31 (×3): qty 100

## 2020-10-31 MED ORDER — SODIUM CHLORIDE 0.9 % IV SOLN
100.0000 mg | Freq: Every day | INTRAVENOUS | Status: AC
  Administered 2020-11-01 – 2020-11-02 (×2): 100 mg via INTRAVENOUS
  Filled 2020-10-31 (×6): qty 20

## 2020-10-31 MED ORDER — ACETAMINOPHEN 500 MG PO TABS
1000.0000 mg | ORAL_TABLET | Freq: Once | ORAL | Status: AC
  Administered 2020-10-31: 1000 mg via ORAL
  Filled 2020-10-31: qty 2

## 2020-10-31 MED ORDER — PROPOFOL 10 MG/ML IV BOLUS
INTRAVENOUS | Status: AC
  Filled 2020-10-31: qty 20

## 2020-10-31 MED ORDER — SODIUM CHLORIDE 0.9 % IV SOLN
200.0000 mg | Freq: Once | INTRAVENOUS | Status: AC
  Administered 2020-10-31: 200 mg via INTRAVENOUS
  Filled 2020-10-31: qty 40

## 2020-10-31 SURGICAL SUPPLY — 68 items
BIT DRILL CALIBRATED 4.2 (BIT) IMPLANT
BIT DRILL SHORT 4.2 (BIT) IMPLANT
BLADE SURG 10 STRL SS (BLADE) ×4 IMPLANT
BNDG COHESIVE 4X5 TAN STRL (GAUZE/BANDAGES/DRESSINGS) ×3 IMPLANT
BNDG COHESIVE 6X5 TAN STRL LF (GAUZE/BANDAGES/DRESSINGS) IMPLANT
BNDG ELASTIC 4X5.8 VLCR STR LF (GAUZE/BANDAGES/DRESSINGS) ×1 IMPLANT
BNDG ELASTIC 6X10 VLCR STRL LF (GAUZE/BANDAGES/DRESSINGS) ×2 IMPLANT
BNDG ELASTIC 6X5.8 VLCR STR LF (GAUZE/BANDAGES/DRESSINGS) ×1 IMPLANT
CHLORAPREP W/TINT 26 (MISCELLANEOUS) ×7 IMPLANT
CLOSURE STERI-STRIP 1/2X4 (GAUZE/BANDAGES/DRESSINGS) ×1
CLSR STERI-STRIP ANTIMIC 1/2X4 (GAUZE/BANDAGES/DRESSINGS) ×1 IMPLANT
COVER SURGICAL LIGHT HANDLE (MISCELLANEOUS) ×3 IMPLANT
DRAPE C-ARM 35X43 STRL (DRAPES) ×3 IMPLANT
DRAPE C-ARM 42X72 X-RAY (DRAPES) ×2 IMPLANT
DRAPE C-ARMOR (DRAPES) ×3 IMPLANT
DRAPE HALF SHEET 40X57 (DRAPES) ×6 IMPLANT
DRAPE IMP U-DRAPE 54X76 (DRAPES) ×6 IMPLANT
DRAPE INCISE IOBAN 66X45 STRL (DRAPES) ×3 IMPLANT
DRAPE ORTHO SPLIT 77X108 STRL (DRAPES) ×6
DRAPE SURG 17X23 STRL (DRAPES) ×3 IMPLANT
DRAPE SURG ORHT 6 SPLT 77X108 (DRAPES) ×2 IMPLANT
DRAPE U-SHAPE 47X51 STRL (DRAPES) ×3 IMPLANT
DRILL BIT CALIBRATED 4.2 (BIT) ×3
DRILL BIT SHORT 4.2 (BIT) ×6
DRSG MEPILEX BORDER 4X4 (GAUZE/BANDAGES/DRESSINGS) ×9 IMPLANT
DRSG MEPILEX BORDER 4X8 (GAUZE/BANDAGES/DRESSINGS) ×1 IMPLANT
ELECT REM PT RETURN 9FT ADLT (ELECTROSURGICAL) ×3
ELECTRODE REM PT RTRN 9FT ADLT (ELECTROSURGICAL) ×1 IMPLANT
GLOVE BIO SURGEON STRL SZ 6.5 (GLOVE) ×6 IMPLANT
GLOVE BIO SURGEON STRL SZ7.5 (GLOVE) ×9 IMPLANT
GLOVE BIO SURGEONS STRL SZ 6.5 (GLOVE) ×3
GLOVE BIOGEL PI IND STRL 7.5 (GLOVE) ×1 IMPLANT
GLOVE BIOGEL PI INDICATOR 7.5 (GLOVE) ×2
GLOVE SURG UNDER POLY LF SZ6.5 (GLOVE) ×3 IMPLANT
GOWN STRL REUS W/ TWL LRG LVL3 (GOWN DISPOSABLE) ×3 IMPLANT
GOWN STRL REUS W/ TWL XL LVL3 (GOWN DISPOSABLE) ×1 IMPLANT
GOWN STRL REUS W/TWL LRG LVL3 (GOWN DISPOSABLE) ×9
GOWN STRL REUS W/TWL XL LVL3 (GOWN DISPOSABLE) ×3
GUIDEWIRE 3.2X400 (WIRE) ×2 IMPLANT
KIT BASIN OR (CUSTOM PROCEDURE TRAY) ×3 IMPLANT
KIT TURNOVER KIT B (KITS) ×3 IMPLANT
MANIFOLD NEPTUNE II (INSTRUMENTS) ×3 IMPLANT
NAIL FEMORAL RETRO 11X380MM (Nail) ×2 IMPLANT
NS IRRIG 1000ML POUR BTL (IV SOLUTION) ×3 IMPLANT
PACK GENERAL/GYN (CUSTOM PROCEDURE TRAY) ×3 IMPLANT
PAD ARMBOARD 7.5X6 YLW CONV (MISCELLANEOUS) ×6 IMPLANT
PAD CAST 4YDX4 CTTN HI CHSV (CAST SUPPLIES) IMPLANT
PADDING CAST COTTON 4X4 STRL (CAST SUPPLIES) ×3
PADDING CAST COTTON 6X4 STRL (CAST SUPPLIES) ×2 IMPLANT
REAMER ROD DEEP FLUTE 2.5X950 (INSTRUMENTS) ×2 IMPLANT
SCREW LOCK STAR 5X36 (Screw) ×2 IMPLANT
SCREW LOCK STAR 5X38 (Screw) ×2 IMPLANT
SCREW LOCK STAR 5X46 (Screw) ×2 IMPLANT
SCREW LOCK STAR 5X56 (Screw) ×2 IMPLANT
SCREW LOCK STAR 5X76 (Screw) ×2 IMPLANT
STAPLER VISISTAT 35W (STAPLE) ×3 IMPLANT
STOCKINETTE IMPERVIOUS LG (DRAPES) ×3 IMPLANT
SUT ETHILON 3 0 PS 1 (SUTURE) ×3 IMPLANT
SUT MNCRL AB 3-0 PS2 18 (SUTURE) ×3 IMPLANT
SUT MNCRL AB 3-0 PS2 27 (SUTURE) ×2 IMPLANT
SUT VIC AB 0 CT1 27 (SUTURE)
SUT VIC AB 0 CT1 27XBRD ANBCTR (SUTURE) IMPLANT
SUT VIC AB 2-0 CT1 27 (SUTURE) ×9
SUT VIC AB 2-0 CT1 TAPERPNT 27 (SUTURE) ×2 IMPLANT
TOWEL GREEN STERILE (TOWEL DISPOSABLE) ×6 IMPLANT
TOWEL GREEN STERILE FF (TOWEL DISPOSABLE) ×3 IMPLANT
UNDERPAD 30X36 HEAVY ABSORB (UNDERPADS AND DIAPERS) ×3 IMPLANT
WATER STERILE IRR 1000ML POUR (IV SOLUTION) ×1 IMPLANT

## 2020-10-31 NOTE — Interval H&P Note (Signed)
History and Physical Interval Note:  10/31/2020 11:31 AM  Dennis [name].  has presented today for surgery, with the diagnosis of Left femur fx.  The various methods of treatment have been discussed with the patient and family. After consideration of risks, benefits and other options for treatment, the patient has consented to  Procedure(s): INTRAMEDULLARY (IM) NAIL FEMORAL (Left) as a surgical intervention.  The patient's history has been reviewed, patient examined, no change in status, stable for surgery.  I have reviewed the patient's chart and labs.  Questions were answered to the patient's satisfaction.     Lennette Bihari P Rhet Rorke

## 2020-10-31 NOTE — Op Note (Signed)
Orthopaedic Surgery Operative Note (CSN: [id number] ) Date of Surgery: 10/31/2020  Admit Date: 10/30/2020   Diagnoses: Pre-Op Diagnoses: Left femoral shaft fracture   Post-Op Diagnosis: Same  Procedures: CPT 27506-Retrograde intramedullary nailing of left femur fracture  Surgeons : Primary: Shona Needles, MD  Assistant: Patrecia Pace, PA-C  Location: OR 7   Anesthesia:General   Antibiotics: Ancef 2g preop with 1 gm vancomycin powder placed topically   Tourniquet time:None    Estimated Blood WUJW:11 mL  Complications:None   Specimens:None   Implants: Implant Name Type Inv. Item Serial No. Manufacturer Lot No. LRB No. Used Action  NAIL FEMORAL RETRO [id number] - [id number] Nail NAIL FEMORAL RETRO [id number]  [employer] [id number] Left 1 Implanted  SCREW LOCK STAR 5X56 - [id number] Screw SCREW LOCK STAR 5X56  [employer]PU[employer] N/A Left 1 Implanted  SCREW LOCK STAR 5X76 - [id number] Screw SCREW LOCK STAR 5X76  [employer]PU[employer] N/A Left 1 Implanted  SCREW LOCK STAR 5X46 - [id number] Screw SCREW LOCK STAR 5X46  DEPU[employer] N/A Left 1 Implanted  SCREW LOCK STAR 5X38 - [id number] Screw SCREW LOCK STAR 5X38  DEPU[employer]TH[employer] N/A Left 1 Implanted  SCREW LOCK STAR 5X36 - [id number] Screw SCREW LOCK STAR 5X36  DEPU[employer] ORTH[employer] N/A Left 1 Implanted     Indications for Surgery: 60 year old male who slipped and fell on the ice.  He sustained a left femoral shaft fracture.  Due to the unstable nature of his injury I recommend proceeding with retrograde intramedullary nailing.  Risks and benefits were discussed with the patient.  Risks included but not limited to bleeding, infection, malunion, nonunion, hardware failure, hardware irritation, nerve and blood vessel injury, DVT, even the possibility anesthetic complications.  The patient agreed to proceed with surgery and consent was obtained.  Operative Findings: Retrograde intramedullary nailing of left femoral shaft fracture  using Synthes 11 x 380 mm RAFN  Procedure: The patient was identified in the preoperative holding area. Consent was confirmed with the patient and their family and all questions were answered. The operative extremity was marked after confirmation with the patient. he was then brought back to the operating room by our anesthesia colleagues.  He was placed under general anesthetic and carefully transferred over to a radiolucent flat top table.  His left lower extremity was then prepped and draped in usual sterile fashion.  A timeout was performed to verify the patient, the procedure, and the extremity.  Preoperative antibiotics were dosed.  Fluoroscopic imaging was obtained to show the unstable nature of his injury.  The hip and knee were flexed over a triangle.  Reduction maneuver was performed.  A medial parapatellar incision was made and carried into the capsule.  Curved Mayo scissors was used to enter the joint.  A threaded guidewire was then directed up into the metaphysis at the appropriate starting point.  An entry reamer was used to enter the medullary canal.  I then passed a ball-tipped guidewire down the center canal while performing a reduction maneuver on the thigh.  I was able to seat it into the intertrochanteric region of the femur.  I then measured the length of the nail and chose to use a 380 mm nail.  I sequentially reamed from 8.5 mm to 12.5 mm and chose to place 11 mm nail.  The nail was then placed into the canal.  I used targeting arm distally to place 3 lateral to medial interlocking screws.  I then straighten the knee after removing the targeting  arm and made the rotation symmetric to the contralateral side.  And then the performed perfect circle technique to place anterior posterior proximal interlocking screws.  Final fluoroscopic imaging was obtained.  The incisions were copiously irrigated.  A gram of vancomycin powder was placed into the incisions.  A layer closure of 2-0 Vicryl 3-0  Monocryl and Dermabond was used to seal the skin.  Sterile dressings were placed.  The patient was then awoken from anesthesia and taken to the PACU in stable condition.   Post Op Plan/Instructions: Patient will be weightbearing as tolerated to left lower extremity.  He will receive postoperative Ancef.  He will be restarted on his Coumadin postoperative day 1 if his hemoglobin is stable.  We will mobilize him with physical and Occupational Therapy.  I was present and performed the entire surgery.  Patrecia Pace, PA-C did assist me throughout the case. An assistant was necessary given the difficulty in approach, maintenance of reduction and ability to instrument the fracture.   Katha Hamming, MD [facility]

## 2020-10-31 NOTE — Consult Note (Signed)
Orthopaedic Trauma Service (OTS) Consult   Patient ID: Dennis [name]. MRN: [id number] DOB/AGE: June 09, 1961 60 y.o.  Reason for Consult:Left femur fracture Referring Physician: Dr. Victorino December, MD [facility]  HPI: Dennis [name]. is an 60 y.o. male who is being seen in consultation at the request of Dr. Stann Mainland for evaluation of left femur fracture.  Patient was walking yesterday and slipped on the ice.  He had immediate pain and inability bear weight.  He was brought to the ED where x-rays showed a left femur fracture.  Denies any injury anywhere else.  The patient lives with his mom and his brother and is on disability.  He has lung cancer and is on Coumadin.  Due to the complexity of the injury and the OR availability Dr. Stann Mainland I had asked that I take over care.  Patient was seen and evaluated on 5 N.  Currently comfortable not having much pain.  No major issues denies any numbness or tingling.  Does note there is an area over his anterior thigh that has been present for years and is tender to palpation.  Is not fluctuant and does not drain.  No other complaints regarding that leg previously.  Past Medical History:  Diagnosis Date  . Alcohol abuse with alcohol-induced mood disorder ([place]) 08/18/2015  . Automatic implantable cardioverter-defibrillator in situ 2012   S/P St Jude Dual Chamber. ICD.  Marland Kitchen CAD (coronary artery disease) 11/16/2014   CAD Coronary angiography (12/2008) with mid LAD totally occluded and collateralized.  . Cardiac LV ejection fraction 10-20%   . Chest pain 09/04/2018  . CHF (congestive heart failure) ([place])   . Chronic systolic heart failure (HCC)    a) Mixed ICM/NICM b) RHC (05/2014): RA 2, RV 19/2/3, PA 22/14 (18), PCWP 6, Fick CO/CI: 5.2 / 2.7, PVR 2.3 WU, PA 60% and 64% c) ECHO (05/2014): EF 20-25%, diff HK, akinesis entireanteroseptal myocardium, triv AI, mod MR, LA mod/sev dilated  . Cocaine abuse ([place]) 01/24/2015  . Cocaine abuse with cocaine-induced mood disorder  ([place]) 08/20/2015  . Drug abuse and dependence ([place])   . Dysphagia following cerebral infarction 01/24/2015  . Embolic stroke involving right middle cerebral artery ([place])   . Extensive stage primary small cell carcinoma of lung ([place]) 01/04/2020  . H/O noncompliance with medical treatment, presenting hazards to health 01/24/2015  . Heart murmur   . High cholesterol   . Ischemic cardiomyopathy    a) Coronary angiography (12/2008) at [facility]: Lmain: nl, LAD mid 100% stenosis with left to left and right-to-left collaterals to the distal LAD; Lcx: nl, RCA nl.    . 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[place])   . MDD (major depressive disorder), recurrent severe, without psychosis ([place]) 08/18/2015  . Open wound of foot 02/27/2015  . Pneumonia 1988  . Psychoactive substance-induced mood disorder ([place]) 07/17/2015  . Sleep apnea    "cleared after T&A"  . Status post PICC central line placement   . Stroke ([place])   . Substance induced mood disorder ([place]) 08/17/2015  . Type II diabetes mellitus ([place])     Past Surgical History:  Procedure Laterality Date  . BRONCHIAL BRUSHINGS  12/28/2019   Procedure: BRONCHIAL BRUSHINGS;  Surgeon: Garner Nash, DO;  Location: [place];  Service: Thoracic;;  . BRONCHIAL NEEDLE ASPIRATION BIOPSY  12/28/2019   Procedure: BRONCHIAL NEEDLE ASPIRATION BIOPSIES;  Surgeon: Garner Nash, DO;  Location: [place];  Service: Thoracic;;  . BRONCHIAL WASHINGS  12/28/2019   Procedure: BRONCHIAL WASHINGS;  Surgeon: Garner Nash, DO;  Location: [facility];  Service: Thoracic;;  . CARDIAC CATHETERIZATION  05/2014  . CARDIAC DEFIBRILLATOR PLACEMENT  03/2011  . CHOLECYSTECTOMY  1994  . FOREARM FRACTURE SURGERY Left 1980  . FRACTURE SURGERY    . IR IMAGING GUIDED PORT INSERTION  10/11/2020  . RIGHT HEART CATHETERIZATION N/A 05/30/2014   Procedure: RIGHT HEART CATH;  Surgeon: Jolaine Artist, MD;  Location: [facility] CATH LAB;  Service: Cardiovascular;  Laterality: N/A;  . RIGHT HEART  CATHETERIZATION N/A 02/07/2015   Procedure: RIGHT HEART CATH;  Surgeon: Jolaine Artist, MD;  Location: [facility] CATH LAB;  Service: Cardiovascular;  Laterality: N/A;  . TONSILLECTOMY AND ADENOIDECTOMY  ~ 1998  . VIDEO BRONCHOSCOPY WITH ENDOBRONCHIAL ULTRASOUND N/A 12/28/2019   Procedure: VIDEO BRONCHOSCOPY WITH ENDOBRONCHIAL ULTRASOUND;  Surgeon: Garner Nash, DO;  Location: [facility] ENDOSCOPY;  Service: Thoracic;  Laterality: N/A;    Family History  Problem Relation Age of Onset  . Diabetes Mother   . Heart disease Mother   . Diabetes Father   . Prostate cancer Father   . Heart disease Father   . Diabetes Brother   . Diabetes Brother     Social History:  reports that he has quit smoking. His smoking use included cigarettes. He has a 7.75 pack-year smoking history. He has never used smokeless tobacco. He reports previous alcohol use. He reports current drug use. Drug: Cocaine.  Allergies: No Known Allergies  Medications:  No current facility-administered medications on file prior to encounter.   Current Outpatient Medications on File Prior to Encounter  Medication Sig Dispense Refill  . dexamethasone (DECADRON) 2 MG tablet Take 1 tablet (2 mg total) by mouth daily. 60 tablet 0  . insulin glargine (LANTUS SOLOSTAR) 100 UNIT/ML Solostar Pen Inject 25 Units into the skin 2 (two) times daily. (Patient taking differently: Inject 25 Units into the skin in the morning and at bedtime.) 15 mL 2  . insulin lispro (HUMALOG KWIKPEN) 100 UNIT/ML KwikPen Inject 6 Units into the skin 3 (three) times daily. (Patient taking differently: Inject 6 Units into the skin 3 (three) times daily with meals.) 15 mL 1  . lidocaine-prilocaine (EMLA) cream Apply 1 application topically as needed. (Patient taking differently: Apply 1 application topically as needed (as directed).) 30 g 2  . midodrine (PROAMATINE) 10 MG tablet Take 1 tablet (10 mg total) by mouth 3 (three) times daily with meals. 90 tablet 2  .  prochlorperazine (COMPAZINE) 10 MG tablet Take 10 mg by mouth every 6 (six) hours as needed for nausea or vomiting.    . sennosides-docusate sodium (SENOKOT-S) 8.6-50 MG tablet Take 1 tablet by mouth daily as needed for constipation.    . sodium chloride 1 g tablet Take 2 tablets (2 g total) by mouth 3 (three) times daily with meals. (Patient taking differently: Take 1 g by mouth in the morning.) 180 tablet 1  . tamsulosin (FLOMAX) 0.4 MG CAPS capsule Take 0.4 mg by mouth daily.    Marland Kitchen warfarin (COUMADIN) 7.5 MG tablet TAKE 1 TABLET BY MOUTH DAILY AT 6PM. (Patient taking differently: Take 3.75-7.5 mg by mouth See admin instructions. Take 3.75 mg by mouth in the evening on Sun/Mon/Wed/Thurs/Fri/Sat and 7.5 on Tues) 30 tablet 0  . Accu-Chek Softclix Lancets lancets     . atorvastatin (LIPITOR) 20 MG tablet TAKE 1 TABLET BY MOUTH DAILY AT 6 PM. (Patient not taking: No sig reported) 30 tablet 6  . Blood Glucose Monitoring Suppl (FIFTY50  GLUCOSE METER 2.0) w/Device KIT Use as instructed    . feeding supplement (ENSURE ENLIVE / ENSURE PLUS) LIQD Take 237 mLs by mouth 2 (two) times daily between meals. 237 mL 0  . INSUPEN ULTRAFIN 31G X 8 MM MISC       ROS: Constitutional: No fever or chills Vision: No changes in vision ENT: No difficulty swallowing CV: No chest pain Pulm: No SOB or wheezing GI: No nausea or vomiting GU: No urgency or inability to hold urine Skin: No poor wound healing Neurologic: No numbness or tingling Psychiatric: No depression or anxiety Heme: No bruising Allergic: No reaction to medications or food   Exam: Blood pressure 115/89, pulse 98, temperature 97.9 F (36.6 C), temperature source Oral, resp. rate 18, weight 80.4 kg, SpO2 100 %. General: No acute distress Orientation: Awake alert and oriented x3 Mood and Affect: Cooperative and pleasant Gait: Unable to assess due to his fracture Coordination and balance: Within normal limits  Left lower extremity: Obvious  deformity with shortening and external rotation of the left leg.  He has been placed in Buck's traction.  Compartments are soft compressible.  He has active dorsiflexion plantarflexion.  He has sensation intact light touch.  He has warm well-perfused foot with 2+ DP pulses.  Unable to perform instability exam.  Global tenderness about the thigh.  He does have a small area that looks like an old scar on the anterior portion of his leg that is not fluctuant and is tender to touch.  Right lower extremity: Skin without lesions. No tenderness to palpation. Full painless ROM, full strength in each muscle groups without evidence of instability.   Medical Decision Making: Data: Imaging: X-rays are reviewed which shows a left femoral shaft fracture with significant displacement and comminution  Labs:  Results for orders placed or performed during the hospital encounter of 10/30/20 (from the past 24 hour(s))  Type and screen [place]     Status: None   Collection Time: 10/30/20 11:42 AM  Result Value Ref Range   ABO/RH(D) A POS    Antibody Screen NEG    Sample Expiration      11/02/2020,2359 Performed at [facility], [place] [address], [place], [place] [address]   Comprehensive metabolic panel     Status: Abnormal   Collection Time: 10/30/20 11:51 AM  Result Value Ref Range   Sodium 141 135 - 145 mmol/L   Potassium 4.4 3.5 - 5.1 mmol/L   Chloride 109 98 - 111 mmol/L   CO2 22 22 - 32 mmol/L   Glucose, Bld 294 (H) 70 - 99 mg/dL   BUN 19 6 - 20 mg/dL   Creatinine, Ser 1.41 (H) 0.61 - 1.24 mg/dL   Calcium 8.9 8.9 - 10.3 mg/dL   Total Protein 5.8 (L) 6.5 - 8.1 g/dL   Albumin 2.8 (L) 3.5 - 5.0 g/dL   AST 33 15 - 41 U/L   ALT 25 0 - 44 U/L   Alkaline Phosphatase 95 38 - 126 U/L   Total Bilirubin 1.0 0.3 - 1.2 mg/dL   GFR, Estimated 58 (L) >60 mL/min   Anion gap 10 5 - 15  CBC     Status: Abnormal   Collection Time: 10/30/20 11:51 AM  Result Value Ref Range   WBC 10.3  4.0 - 10.5 K/uL   RBC 4.01 (L) 4.22 - 5.81 MIL/uL   Hemoglobin 11.9 (L) 13.0 - 17.0 g/dL   HCT 39.5 39.0 - 52.0 %  MCV 98.5 80.0 - 100.0 fL   MCH 29.7 26.0 - 34.0 pg   MCHC 30.1 30.0 - 36.0 g/dL   RDW 14.3 11.5 - 15.5 %   Platelets 198 150 - 400 K/uL   nRBC 0.0 0.0 - 0.2 %  Urinalysis, Routine w reflex microscopic     Status: Abnormal   Collection Time: 10/30/20 11:51 AM  Result Value Ref Range   Color, Urine YELLOW YELLOW   APPearance CLEAR CLEAR   Specific Gravity, Urine 1.018 1.005 - 1.030   pH 5.0 5.0 - 8.0   Glucose, UA >=500 (A) NEGATIVE mg/dL   Hgb urine dipstick MODERATE (A) NEGATIVE   Bilirubin Urine NEGATIVE NEGATIVE   Ketones, ur NEGATIVE NEGATIVE mg/dL   Protein, ur 100 (A) NEGATIVE mg/dL   Nitrite NEGATIVE NEGATIVE   Leukocytes,Ua NEGATIVE NEGATIVE   RBC / HPF 0-5 0 - 5 RBC/hpf   WBC, UA 0-5 0 - 5 WBC/hpf   Bacteria, UA RARE (A) NONE SEEN   Mucus PRESENT   Protime-INR     Status: Abnormal   Collection Time: 10/30/20 11:51 AM  Result Value Ref Range   Prothrombin Time 21.1 (H) 11.4 - 15.2 seconds   INR 1.9 (H) 0.8 - 1.2  Resp Panel by RT-PCR (Flu A&B, Covid) Nasopharyngeal Swab     Status: Abnormal   Collection Time: 10/30/20 11:56 AM   Specimen: Nasopharyngeal Swab; Nasopharyngeal(NP) swabs in vial transport medium  Result Value Ref Range   SARS Coronavirus 2 by RT PCR POSITIVE (A) NEGATIVE   Influenza A by PCR NEGATIVE NEGATIVE   Influenza B by PCR NEGATIVE NEGATIVE  Urine rapid drug screen (hosp performed)     Status: Abnormal   Collection Time: 10/30/20  3:18 PM  Result Value Ref Range   Opiates NONE DETECTED NONE DETECTED   Cocaine POSITIVE (A) NONE DETECTED   Benzodiazepines NONE DETECTED NONE DETECTED   Amphetamines NONE DETECTED NONE DETECTED   Tetrahydrocannabinol NONE DETECTED NONE DETECTED   Barbiturates NONE DETECTED NONE DETECTED  Surgical pcr screen     Status: None   Collection Time: 10/30/20  4:30 PM   Specimen: Nasal Mucosa; Nasal Swab   Result Value Ref Range   MRSA, PCR NEGATIVE NEGATIVE   Staphylococcus aureus NEGATIVE NEGATIVE  Glucose, capillary     Status: Abnormal   Collection Time: 10/30/20  5:06 PM  Result Value Ref Range   Glucose-Capillary 189 (H) 70 - 99 mg/dL  Hepatitis B surface antigen     Status: None   Collection Time: 10/30/20  5:33 PM  Result Value Ref Range   Hepatitis B Surface Ag NON REACTIVE NON REACTIVE  Ferritin     Status: None   Collection Time: 10/30/20  5:33 PM  Result Value Ref Range   Ferritin 146 24 - 336 ng/mL  Fibrinogen     Status: None   Collection Time: 10/30/20  5:33 PM  Result Value Ref Range   Fibrinogen 467 210 - 475 mg/dL  C-reactive protein     Status: None   Collection Time: 10/30/20  5:33 PM  Result Value Ref Range   CRP 0.6 <1.0 mg/dL  D-dimer, quantitative (not at [facility])     Status: Abnormal   Collection Time: 10/30/20  5:33 PM  Result Value Ref Range   D-Dimer, Quant 5.40 (H) 0.00 - 0.50 ug/mL-FEU  Lactate dehydrogenase     Status: Abnormal   Collection Time: 10/30/20  8:00 PM  Result Value Ref Range  LDH 295 (H) 98 - 192 U/L  Procalcitonin     Status: None   Collection Time: 10/30/20  8:00 PM  Result Value Ref Range   Procalcitonin <0.10 ng/mL  Brain natriuretic peptide     Status: Abnormal   Collection Time: 10/30/20  8:00 PM  Result Value Ref Range   B Natriuretic Peptide 795.1 (H) 0.0 - 100.0 pg/mL  Glucose, capillary     Status: Abnormal   Collection Time: 10/30/20  9:05 PM  Result Value Ref Range   Glucose-Capillary 249 (H) 70 - 99 mg/dL  Glucose, capillary     Status: Abnormal   Collection Time: 10/31/20  6:00 AM  Result Value Ref Range   Glucose-Capillary 261 (H) 70 - 99 mg/dL  Protime-INR     Status: Abnormal   Collection Time: 10/31/20  7:31 AM  Result Value Ref Range   Prothrombin Time 16.8 (H) 11.4 - 15.2 seconds   INR 1.4 (H) 0.8 - 1.2  CBC with Differential/Platelet     Status: Abnormal   Collection Time: 10/31/20  7:31 AM  Result  Value Ref Range   WBC 11.7 (H) 4.0 - 10.5 K/uL   RBC 3.60 (L) 4.22 - 5.81 MIL/uL   Hemoglobin 10.8 (L) 13.0 - 17.0 g/dL   HCT 35.9 (L) 39.0 - 52.0 %   MCV 99.7 80.0 - 100.0 fL   MCH 30.0 26.0 - 34.0 pg   MCHC 30.1 30.0 - 36.0 g/dL   RDW 14.4 11.5 - 15.5 %   Platelets 170 150 - 400 K/uL   nRBC 0.0 0.0 - 0.2 %   Neutrophils Relative % 80 %   Neutro Abs 9.3 (H) 1.7 - 7.7 K/uL   Lymphocytes Relative 11 %   Lymphs Abs 1.3 0.7 - 4.0 K/uL   Monocytes Relative 6 %   Monocytes Absolute 0.7 0.1 - 1.0 K/uL   Eosinophils Relative 2 %   Eosinophils Absolute 0.2 0.0 - 0.5 K/uL   Basophils Relative 0 %   Basophils Absolute 0.0 0.0 - 0.1 K/uL   Immature Granulocytes 1 %   Abs Immature Granulocytes 0.07 0.00 - 0.07 K/uL  Comprehensive metabolic panel     Status: Abnormal   Collection Time: 10/31/20  7:31 AM  Result Value Ref Range   Sodium 137 135 - 145 mmol/L   Potassium 3.9 3.5 - 5.1 mmol/L   Chloride 108 98 - 111 mmol/L   CO2 19 (L) 22 - 32 mmol/L   Glucose, Bld 241 (H) 70 - 99 mg/dL   BUN 16 6 - 20 mg/dL   Creatinine, Ser 1.15 0.61 - 1.24 mg/dL   Calcium 8.5 (L) 8.9 - 10.3 mg/dL   Total Protein 5.4 (L) 6.5 - 8.1 g/dL   Albumin 2.5 (L) 3.5 - 5.0 g/dL   AST 18 15 - 41 U/L   ALT 22 0 - 44 U/L   Alkaline Phosphatase 91 38 - 126 U/L   Total Bilirubin 0.9 0.3 - 1.2 mg/dL   GFR, Estimated >60 >60 mL/min   Anion gap 10 5 - 15  C-reactive protein     Status: Abnormal   Collection Time: 10/31/20  7:31 AM  Result Value Ref Range   CRP 2.7 (H) <1.0 mg/dL  D-dimer, quantitative (not at [facility])     Status: Abnormal   Collection Time: 10/31/20  7:31 AM  Result Value Ref Range   D-Dimer, Quant 2.02 (H) 0.00 - 0.50 ug/mL-FEU  Ferritin     Status:  None   Collection Time: 10/31/20  7:31 AM  Result Value Ref Range   Ferritin 158 24 - 336 ng/mL  Magnesium     Status: Abnormal   Collection Time: 10/31/20  7:31 AM  Result Value Ref Range   Magnesium 1.6 (L) 1.7 - 2.4 mg/dL  Phosphorus     Status:  None   Collection Time: 10/31/20  7:31 AM  Result Value Ref Range   Phosphorus 3.4 2.5 - 4.6 mg/dL  Glucose, capillary     Status: Abnormal   Collection Time: 10/31/20 11:20 AM  Result Value Ref Range   Glucose-Capillary 155 (H) 70 - 99 mg/dL     Imaging or Labs ordered: None  Medical history and chart was reviewed and case discussed with medical provider.  Assessment/Plan: 60 year old male with lung cancer who is also on Coumadin with a history of coronary artery disease and congestive heart failure that presents with a left femoral shaft fracture  Patient will require intramedullary nailing of his left femur.  Risks and benefits were discussed with the patient.  Risks included but not limited to bleeding, infection, malunion, nonunion, hardware failure, hardware irritation, nerve or blood vessel injury, DVT, even the possibility of anesthetic complications.  The patient agreed to proceed with surgery and consent was obtained.  Shona Needles, MD [facility] [phone] (office) [email or web address]

## 2020-10-31 NOTE — Progress Notes (Addendum)
[facility] PROGRESS NOTE   Dennis [name]. JOI:[id number] DOB: [birth year] DOA: 10/30/2020  PCP: [name], MD  Brief History/Interval Summary: 60 y.o. 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  He landed on his butt, and immediately saw the deformity to his left leg.  Patient admits that he has had a intermittently productive cough over the last few days.  He had been around his brother and sister-in-law who tested positive for COVID.  Patient reports that he just received his booster shot approximately 2 months ago.  Patient was found to have left distal femur fracture.  He was hospitalized for further management.    Reason for Visit: Left distal femur fracture.  COVID-19 infection  Consultants: Orthopedics.  Cardiology.  Procedures: None yet  Antibiotics: Anti-infectives (From admission, onward)   Start     Dose/Rate Route Frequency Ordered Stop   10/31/20 0600  ceFAZolin (ANCEF) IVPB 2g/100 mL premix        2 g 200 mL/hr over 30 Minutes Intravenous To Short Stay 10/30/20 1848 11/01/20 0600      Subjective/Interval History: Patient complains of mild cough.  Denies any shortness of breath.  No chest pain.  No nausea vomiting.       Assessment/Plan:  Distal left femur fracture Management per orthopedics.  Pain control.  Due to significant cardiac history we will request cardiology to consult on this patient to provide preoperative evaluation. Based on the [place] Perioperative Risk Index the patient's estimated risk probability for perioperative myocardial infarction or cardiac arrest is 2.63. Patient's procedure is intermediate risk. Patient's functional capacity is moderate. Patient considered moderate to high risk for any perioperative  cardiac complications.  Discussed with orthopedic surgeon.  The longer the surgery is delayed in these patients the worse the outcomes are noted.  He does not have any acute cardiac issues currently.  Denies any chest pain.  No evidence of unstable angina.  There is some concern for fluid overload.  We will give him a dose of furosemide. Orthopedics and anesthesia have discussed with patient.  Okay to proceed with surgery.  We will follow him closely with cardiology postoperatively.   COVID-19 infection/acute respiratory failure with hypoxia He had exposure this past weekend from his brother and sister-in-law who tested positive for COVID-19.  Patient has minimal cough.  Chest x-ray raise concern for interstitial prominence concerning for possible pneumonitis or CHF.  BNP was noted to be elevated at 795.  Patient also positive for COVID-19.  We will give IV furosemide.  Noted to be on dexamethasone which is for his history of brain lesion.  Will consider increasing dose.  Give him a 3-day course of Remdesivir.  We will check inflammatory markers and D-dimer.  Incentive spirometry.  Noted to be on 2 L of oxygen saturating in the mid to late 90s.  Patient requesting that he be taken off of O2.  Discussed with nursing staff.  Chronic systolic CHF/AICD in situ/chronic anticoagulation EF noted to be 25 to 30% based on echocardiogram done in September 2021.  Strict ins and outs and daily weights.  Cardiology consulted to assist with preoperative management.  BNP noted to be elevated.  No diuretics listed on his home medication list. Patient on warfarin at home.  Currently on hold due to need for surgery.  History of small cell lung cancer with a metastatic brain lesion Patient followed by Dr. Mickeal Skinner and Dr. Julien Nordmann.  Patient underwent whole brain radiation and chemotherapy.  Supposed to have repeat MRI brain on January 30.  Noted to be on dexamethasone which is being continued.  Orthostatic  hypotension Continue with midodrine and salt tablets.  Dysphagia Recent speech evaluation revealed mild oropharyngeal dysphagia likely due to his prior stroke as well as brain lesion.  Aspiration precautions.  Diabetes mellitus type 2 uncontrolled with hyperglycemia HbA1c 8.7 in December.  Continue with SSI.  He is on Lantus at home.  Currently on reduced dose due to need for surgery.  History of stroke Patient with right MCA stroke in September 2021.  Has residual dysarthria and left-sided weakness.  PT and OT evaluation when he is able to mobilize.  History of cocaine abuse Urine drug screen still positive for cocaine.  Patient will need counseling.  Hyperlipidemia Statin.   DVT Prophylaxis: Initiate after surgery Code Status: Full code Family Communication: Discussed with the patient.  No family at bedside Disposition Plan: To be determined  Status is: Inpatient  Remains inpatient appropriate because:Ongoing active pain requiring inpatient pain management, IV treatments appropriate due to intensity of illness or inability to take PO and Inpatient level of care appropriate due to severity of illness   Dispo: The patient is from: Home              Anticipated d/c is to: SNF              Anticipated d/c date is: 3 days              Patient currently is not medically stable to d/c.        Medications:  Scheduled: . albuterol  2 puff Inhalation BID  . vitamin C  500 mg Oral Daily  . atorvastatin  20 mg Oral Daily  . Chlorhexidine Gluconate Cloth  6 each Topical Daily  . dexamethasone  2 mg Oral Daily  . feeding supplement  237 mL Oral BID BM  . insulin aspart  0-9 Units Subcutaneous TID WC  . insulin aspart  6 Units Subcutaneous TID WC  . insulin glargine  5 Units Subcutaneous BID  . midodrine  10 mg Oral TID WC  . povidone-iodine  2 application Topical Once  . senna  1 tablet Oral BID  . sodium chloride  1 g Oral q AM  . tamsulosin  0.4 mg Oral Daily  . zinc  sulfate  220 mg Oral Daily   Continuous: .  ceFAZolin (ANCEF) IV     ZWC:HENIDPOEU, chlorpheniramine-HYDROcodone, guaiFENesin-dextromethorphan, HYDROcodone-acetaminophen, lidocaine-prilocaine, morphine injection, senna-docusate   Objective:  Vital Signs  Vitals:   10/30/20 1707 10/30/20 2045 10/31/20 0500 10/31/20 0602  BP: 116/90 121/89  115/89  Pulse: 95 99  98  Resp: 20 20  18   Temp: 98.1 F (36.7 C) 97.9 F (36.6 C)  97.9 F (36.6 C)  TempSrc: Oral Oral  Oral  SpO2: 98% 97%  100%  Weight:   80.4 kg     Intake/Output Summary (Last 24 hours) at 10/31/2020 1145 Last data filed at 10/31/2020 0600 Gross per 24 hour  Intake 240.13 ml  Output 700 ml  Net -459.87 ml   Filed Weights   10/31/20 0500  Weight: 80.4 kg    General appearance: Awake alert.  In no distress Resp: Few crackles bilateral bases.  No wheezing or rhonchi. Cardio: S1-S2 is normal regular.  No S3-S4.  No rubs murmurs or bruit GI: Abdomen is soft.  Nontender nondistended.  Bowel sounds are present normal.  No masses organomegaly Extremities: Left lower extremity is immobilized. Neurologic: No focal neurological deficits.    Lab Results:  Data Reviewed: I have personally reviewed following labs and imaging studies  CBC: Recent Labs  Lab 10/30/20 1151 10/31/20 0731  WBC 10.3 11.7*  NEUTROABS  --  9.3*  HGB 11.9* 10.8*  HCT 39.5 35.9*  MCV 98.5 99.7  PLT 198 518    Basic Metabolic Panel: Recent Labs  Lab 10/30/20 1151 10/31/20 0731  NA 141 137  K 4.4 3.9  CL 109 108  CO2 22 19*  GLUCOSE 294* 241*  BUN 19 16  CREATININE 1.41* 1.15  CALCIUM 8.9 8.5*  MG  --  1.6*  PHOS  --  3.4    GFR: Estimated Creatinine Clearance: 76.9 mL/min (by C-G formula based on SCr of 1.15 mg/dL).  Liver Function Tests: Recent Labs  Lab 10/30/20 1151 10/31/20 0731  AST 33 18  ALT 25 22  ALKPHOS 95 91  BILITOT 1.0 0.9  PROT 5.8* 5.4*  ALBUMIN 2.8* 2.5*     Coagulation Profile: Recent Labs   Lab 10/30/20 1151 10/31/20 0731  INR 1.9* 1.4*    CBG: Recent Labs  Lab 10/30/20 1706 10/30/20 2105 10/31/20 0600 10/31/20 1120  GLUCAP 189* 249* 261* 155*     Anemia Panel: Recent Labs    10/30/20 1733 10/31/20 0731  FERRITIN 146 158    Recent Results (from the past 240 hour(s))  Resp Panel by RT-PCR (Flu A&B, Covid) Nasopharyngeal Swab     Status: Abnormal   Collection Time: 10/30/20 11:56 AM   Specimen: Nasopharyngeal Swab; Nasopharyngeal(NP) swabs in vial transport medium  Result Value Ref Range Status   SARS Coronavirus 2 by RT PCR POSITIVE (A) NEGATIVE Final    Comment: RESULT CALLED TO, READ BACK BY AND VERIFIED WITHAlfonso Patten [facility] RN 1429 10/30/20 A BROWNING (NOTE) SARS-CoV-2 target nucleic acids are DETECTED.  The SARS-CoV-2 RNA is generally detectable in upper respiratory specimens during the acute phase of infection. Positive results are indicative of the presence of the identified virus, but do not rule out bacterial infection or co-infection with other pathogens not detected by the test. Clinical correlation with patient history and other diagnostic information is necessary to determine patient infection status. The expected result is Negative.  Fact Sheet for Patients: EntrepreneurPulse.com.au  Fact Sheet for Healthcare Providers: IncredibleEmployment.be  This test is not yet approved or cleared by the [place] FDA and  has been authorized for detection and/or diagnosis of SARS-CoV-2 by FDA under an Emergency Use Authorization (EUA).  This EUA will remain in effect (meaning this test can  be used) for the duration of  the COVID-19 declaration under Section 564(b)(1) of the Act, 21 U.S.C. section 360bbb-3(b)(1), unless the authorization is terminated or revoked sooner.     Influenza A by PCR NEGATIVE NEGATIVE Final   Influenza B by PCR NEGATIVE NEGATIVE Final    Comment: (NOTE) The Xpert Xpress  SARS-CoV-2/FLU/RSV plus assay is intended as an aid in the diagnosis of influenza from Nasopharyngeal swab specimens and should not be used as a sole basis for treatment. Nasal washings and aspirates are unacceptable for Xpert Xpress SARS-CoV-2/FLU/RSV testing.  Fact Sheet for Patients: EntrepreneurPulse.com.au  Fact Sheet for Healthcare Providers: IncredibleEmployment.be  This test is not yet approved  or cleared by the [place] and has been authorized for detection and/or diagnosis of SARS-CoV-2 by FDA under an Emergency Use Authorization (EUA). This EUA will remain in effect (meaning this test can be used) for the duration of the COVID-19 declaration under Section 564(b)(1) of the Act, 21 U.S.C. section 360bbb-3(b)(1), unless the authorization is terminated or revoked.  Performed at [facility], [place] [address] [place], [place] [address]   Surgical pcr screen     Status: None   Collection Time: 10/30/20  4:30 PM   Specimen: Nasal Mucosa; Nasal Swab  Result Value Ref Range Status   MRSA, PCR NEGATIVE NEGATIVE Final   Staphylococcus aureus NEGATIVE NEGATIVE Final    Comment: (NOTE) The Xpert SA Assay (FDA approved for NASAL specimens in patients 65 years of age and older), is one component of a comprehensive surveillance program. It is not intended to diagnose infection nor to guide or monitor treatment. Performed at [facility], [place] [address], [place], [place] [address]       Radiology Studies: DG Pelvis Portable  Result Date: 10/30/2020 CLINICAL DATA:  Fall EXAM: PORTABLE PELVIS 1-2 VIEWS COMPARISON:  CT PET of January 17, 2020 FINDINGS: Degenerative changes about the bilateral hips. Irregularity along the iliopectineal line on the LEFT.  Osteopenia. No displaced fractures. IMPRESSION: 1. Irregularity along the LEFT iliopectineal line, suspicious for nondisplaced fracture about the LEFT acetabulum. 2. Degenerative  changes about the hips. Electronically Signed   By: Zetta Bills M.D.   On: 10/30/2020 13:03   DG CHEST PORT 1 VIEW  Result Date: 10/30/2020 CLINICAL DATA:  Fall. EXAM: PORTABLE CHEST 1 VIEW COMPARISON:  CT 09/17/2020.  Chest x-ray 10/07/2019. FINDINGS: Port-A-Cath noted with lead tip over the right ventricle. Cardiac pacer with lead tips over the right atrium right ventricle. Cardiomegaly. Diffuse bilateral interstitial prominence. Small left pleural effusion. Findings most consistent CHF. Pneumonitis cannot be excluded. IMPRESSION: 1.  Port-A-Cath noted with lead tip over the right ventricle. 2. Cardiac pacer with lead tips over the right atrium and right ventricle. Cardiomegaly with diffuse bilateral interstitial prominence and small left pleural effusion. Electronically Signed   By: Marcello Moores  Register   On: 10/30/2020 15:28   DG Tibia/Fibula Left Port  Result Date: 10/30/2020 CLINICAL DATA:  Fall, deformity of the knee. EXAM: PORTABLE LEFT TIBIA AND FIBULA - 2 VIEW COMPARISON:  Femoral and pelvic evaluation of the same date. FINDINGS: Overlapping stabilization device limiting assessment. No signs of fracture of the tibia or fibula. No joint effusion noted incidentally in the knee. Incidental note made of mid to distal femoral fracture at the upper margin of the submitted radiographs. Question of medial patellar subluxation versus is artifact from oblique nature of the submitted image. This could also be related to the severity of the fracture in the femur above. IMPRESSION: 1. No signs of fracture of the tibia or fibula. 2. Incidental note is made of mid to distal femoral fracture at the upper margin of the submitted radiographs. 3. Question medial patellar subluxation versus artifact from oblique nature of the submitted image. Electronically Signed   By: Zetta Bills M.D.   On: 10/30/2020 13:06   DG FEMUR PORT 1V LEFT  Result Date: 10/30/2020 CLINICAL DATA:  Fall, deformity about the knee. EXAM:  LEFT FEMUR PORTABLE 1 VIEW COMPARISON:  None FINDINGS: Distal femoral diaphyseal spiral fracture with butterfly fragment and a half shaft with medial displacement of the distal fracture fragment, mild apex medial angulation  and moderate w[place]x anterior angulation at the fracture site. Complete displacement and over riding noted on the lateral view, distal fracture fragment dorsally displaced relative to proximal femur. On lateral view dorsal displacement of fracture fragments are in close proximity to calcified femoral vasculature. Lateral view of the LEFT hip/proximal femur with limited evaluation due to overlap of stabilization device. IMPRESSION: Displaced and angulated spiral fracture of the distal femoral diaphysis as described. Limited evaluation of the LEFT hip/proximal femur due to overlap of stabilization device. Markedly displaced mid to distal femoral fracture is in close proximity to calcified femoral vasculature. Consider CT for vascular assessment as clinically warranted. Electronically Signed   By: Zetta Bills M.D.   On: 10/30/2020 12:56       LOS: 1 day   [facility] Pager on [email or web address]  10/31/2020, 11:45 AM

## 2020-10-31 NOTE — Transfer of Care (Signed)
Immediate Anesthesia Transfer of Care Note  Patient: Dennis [name].  Procedure(s) Performed: INTRAMEDULLARY (IM) RETROGRADE FEMORAL NAILING (Left )  Patient Location: OR  Anesthesia Type:General  Level of Consciousness: awake and patient cooperative  Airway & Oxygen Therapy: Patient Spontanous Breathing and Patient connected to nasal cannula oxygen  Post-op Assessment: Report given to RN, Post -op Vital signs reviewed and stable and Patient moving all extremities  Post vital signs: Reviewed and stable  Last Vitals:  Vitals Value Taken Time  BP    Temp    Pulse    Resp    SpO2      Last Pain:  Vitals:   10/31/20 0845  TempSrc:   PainSc: 4          Complications: No complications documented.

## 2020-10-31 NOTE — Progress Notes (Signed)
Patient unable to receive Gatorade prior to surgical procedure. No Gatorade in stock.

## 2020-10-31 NOTE — Anesthesia Preprocedure Evaluation (Addendum)
Anesthesia Evaluation  Patient identified by MRN, date of birth, ID band Patient awake    Reviewed: Allergy & Precautions, NPO status , Patient's Chart, lab work & pertinent test results  Airway Mallampati: II  TM Distance: >3 FB Neck ROM: Full    Dental  (+) Teeth Intact, Dental Advisory Given, Poor Dentition   Pulmonary sleep apnea , former smoker,  Extensive stage primary small cell carcinoma of lung    Pulmonary exam normal breath sounds clear to auscultation       Cardiovascular hypertension, + CAD and +CHF  Normal cardiovascular exam+ Cardiac Defibrillator + Valvular Problems/Murmurs AI and MR  Rhythm:Regular Rate:Normal  Echo 07/09/20: 1. Since the last exam on 12/25/2019 LVEF has slightly improved from 15-20% to 25-30% with diffuse hypokinesis.  2. Left ventricular ejection fraction, by estimation, is 25 to 30%. The left ventricle has severely decreased function. The left ventricle demonstrates global hypokinesis. The left ventricular internal cavity size was moderately dilated. There is mild concentric left ventricular hypertrophy. Left ventricular diastolic parameters are consistent with Grade II diastolic dysfunction (pseudonormalization). Elevated left atrial pressure.  3. Right ventricular systolic function is normal. The right ventricular size is normal. There is normal pulmonary artery systolic pressure.  4. A small pericardial effusion is present. The pericardial effusion is posterior to the left ventricle.  5. The mitral valve is normal in structure. Mild mitral valve  regurgitation. No evidence of mitral stenosis.  6. The aortic valve is normal in structure. Aortic valve regurgitation is trivial. No aortic stenosis is present.  7. The inferior vena cava is normal in size with greater than 50% respiratory variability, suggesting right atrial pressure of 3 mmHg.    Neuro/Psych PSYCHIATRIC DISORDERS Depression CVA,  Residual Symptoms    GI/Hepatic negative GI ROS, (+)     substance abuse  alcohol use and cocaine use,   Endo/Other  diabetes, Type 2, Insulin Dependent  Renal/GU negative Renal ROS     Musculoskeletal Left femur fracture    Abdominal   Peds  Hematology  (+) Blood dyscrasia (Warfarin), anemia ,   Anesthesia Other Findings Day of surgery medications reviewed with the patient.  Reproductive/Obstetrics                            Anesthesia Physical Anesthesia Plan  ASA: IV  Anesthesia Plan: General   Post-op Pain Management:    Induction: Intravenous, Rapid sequence and Cricoid pressure planned  PONV Risk Score and Plan: 2 and Dexamethasone and Ondansetron  Airway Management Planned: Oral ETT and Video Laryngoscope Planned  Additional Equipment:   Intra-op Plan:   Post-operative Plan: Extubation in OR  Informed Consent: I have reviewed the patients History and Physical, chart, labs and discussed the procedure including the risks, benefits and alternatives for the proposed anesthesia with the patient or authorized representative who has indicated his/her understanding and acceptance.     Dental advisory given  Plan Discussed with: CRNA  Anesthesia Plan Comments: (Etomidate for induction.)        Anesthesia Quick Evaluation

## 2020-10-31 NOTE — H&P (View-Only) (Signed)
Orthopaedic Trauma Service (OTS) Consult   Patient ID: Dennis [name]. MRN: [id number] DOB/AGE: [birth year] 60 y.o.  Reason for Consult:Left femur fracture Referring Physician: Dr. Victorino December, MD [facility]  HPI: Dennis [name]. is an 60 y.o. male who is being seen in consultation at the request of Dr. Stann Mainland for evaluation of left femur fracture.  Patient was walking yesterday and slipped on the ice.  He had immediate pain and inability bear weight.  He was brought to the ED where x-rays showed a left femur fracture.  Denies any injury anywhere else.  The patient lives with his mom and his brother and is on disability.  He has lung cancer and is on Coumadin.  Due to the complexity of the injury and the OR availability Dr. Stann Mainland I had asked that I take over care.  Patient was seen and evaluated on 5 N.  Currently comfortable not having much pain.  No major issues denies any numbness or tingling.  Does note there is an area over his anterior thigh that has been present for years and is tender to palpation.  Is not fluctuant and does not drain.  No other complaints regarding that leg previously.  Past Medical History:  Diagnosis Date  . Alcohol abuse with alcohol-induced mood disorder ([place]) 08/18/2015  . Automatic implantable cardioverter-defibrillator in situ 2012   S/P St Jude Dual Chamber. ICD.  Marland Kitchen CAD (coronary artery disease) 11/16/2014   CAD Coronary angiography (12/2008) with mid LAD totally occluded and collateralized.  . Cardiac LV ejection fraction 10-20%   . Chest pain 09/04/2018  . CHF (congestive heart failure) ([place])   . Chronic systolic heart failure (HCC)    a) Mixed ICM/NICM b) RHC (05/2014): RA 2, RV 19/2/3, PA 22/14 (18), PCWP 6, Fick CO/CI: 5.2 / 2.7, PVR 2.3 WU, PA 60% and 64% c) ECHO (05/2014): EF 20-25%, diff HK, akinesis entireanteroseptal myocardium, triv AI, mod MR, LA mod/sev dilated  . Cocaine abuse ([place]) 01/24/2015  . Cocaine abuse with cocaine-induced mood disorder  ([place]) 08/20/2015  . Drug abuse and dependence ([place])   . Dysphagia following cerebral infarction 01/24/2015  . Embolic stroke involving right middle cerebral artery ([place])   . Extensive stage primary small cell carcinoma of lung ([place]) 01/04/2020  . H/O noncompliance with medical treatment, presenting hazards to health 01/24/2015  . Heart murmur   . High cholesterol   . Ischemic cardiomyopathy    a) Coronary angiography (12/2008) at [facility]: [place]: nl, LAD mid 100% stenosis with left to left and right-to-left collaterals to the distal LAD; Lcx: nl, RCA nl.    . 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[place])   . MDD (major depressive disorder), recurrent severe, without psychosis ([place]) 08/18/2015  . Open wound of foot 02/27/2015  . Pneumonia 1988  . Psychoactive substance-induced mood disorder ([place]) 07/17/2015  . Sleep apnea    "cleared after T&A"  . Status post PICC central line placement   . Stroke ([place])   . Substance induced mood disorder ([place]) 08/17/2015  . Type II diabetes mellitus ([place])     Past Surgical History:  Procedure Laterality Date  . BRONCHIAL BRUSHINGS  12/28/2019   Procedure: BRONCHIAL BRUSHINGS;  Surgeon: Garner Nash, DO;  Location: [facility];  Service: Thoracic;;  . BRONCHIAL NEEDLE ASPIRATION BIOPSY  12/28/2019   Procedure: BRONCHIAL NEEDLE ASPIRATION BIOPSIES;  Surgeon: Garner Nash, DO;  Location: [facility];  Service: Thoracic;;  . BRONCHIAL WASHINGS  12/28/2019   Procedure: BRONCHIAL WASHINGS;  Surgeon: Garner Nash, DO;  Location: [facility];  Service: Thoracic;;  . CARDIAC CATHETERIZATION  05/2014  . CARDIAC DEFIBRILLATOR PLACEMENT  03/2011  . CHOLECYSTECTOMY  1994  . FOREARM FRACTURE SURGERY Left 1980  . FRACTURE SURGERY    . IR IMAGING GUIDED PORT INSERTION  10/11/2020  . RIGHT HEART CATHETERIZATION N/A 05/30/2014   Procedure: RIGHT HEART CATH;  Surgeon: Jolaine Artist, MD;  Location[facility] CATH LAB;  Service: Cardiovascular;  Laterality: N/A;  . RIGHT HEART  CATHETERIZATION N/A 02/07/2015   Procedure: RIGHT HEART CATH;  Surgeon: Jolaine Artist, MD;  Location: [facility] CATH LAB;  Service: Cardiovascular;  Laterality: N/A;  . TONSILLECTOMY AND ADENOIDECTOMY  ~ 1998  . VIDEO BRONCHOSCOPY WITH ENDOBRONCHIAL ULTRASOUND N/A 12/28/2019   Procedure: VIDEO BRONCHOSCOPY WITH ENDOBRONCHIAL ULTRASOUND;  Surgeon: Garner Nash, DO;  Location: [facility];  Service: Thoracic;  Laterality: N/A;    Family History  Problem Relation Age of Onset  . Diabetes Mother   . Heart disease Mother   . Diabetes Father   . Prostate cancer Father   . Heart disease Father   . Diabetes Brother   . Diabetes Brother     Social History:  reports that he has quit smoking. His smoking use included cigarettes. He has a 7.75 pack-year smoking history. He has never used smokeless tobacco. He reports previous alcohol use. He reports current drug use. Drug: Cocaine.  Allergies: No Known Allergies  Medications:  No current facility-administered medications on file prior to encounter.   Current Outpatient Medications on File Prior to Encounter  Medication Sig Dispense Refill  . dexamethasone (DECADRON) 2 MG tablet Take 1 tablet (2 mg total) by mouth daily. 60 tablet 0  . insulin glargine (LANTUS SOLOSTAR) 100 UNIT/ML Solostar Pen Inject 25 Units into the skin 2 (two) times daily. (Patient taking differently: Inject 25 Units into the skin in the morning and at bedtime.) 15 mL 2  . insulin lispro (HUMALOG KWIKPEN) 100 UNIT/ML KwikPen Inject 6 Units into the skin 3 (three) times daily. (Patient taking differently: Inject 6 Units into the skin 3 (three) times daily with meals.) 15 mL 1  . lidocaine-prilocaine (EMLA) cream Apply 1 application topically as needed. (Patient taking differently: Apply 1 application topically as needed (as directed).) 30 g 2  . midodrine (PROAMATINE) 10 MG tablet Take 1 tablet (10 mg total) by mouth 3 (three) times daily with meals. 90 tablet 2  .  prochlorperazine (COMPAZINE) 10 MG tablet Take 10 mg by mouth every 6 (six) hours as needed for nausea or vomiting.    . sennosides-docusate sodium (SENOKOT-S) 8.6-50 MG tablet Take 1 tablet by mouth daily as needed for constipation.    . sodium chloride 1 g tablet Take 2 tablets (2 g total) by mouth 3 (three) times daily with meals. (Patient taking differently: Take 1 g by mouth in the morning.) 180 tablet 1  . tamsulosin (FLOMAX) 0.4 MG CAPS capsule Take 0.4 mg by mouth daily.    Marland Kitchen warfarin (COUMADIN) 7.5 MG tablet TAKE 1 TABLET BY MOUTH DAILY AT 6PM. (Patient taking differently: Take 3.75-7.5 mg by mouth See admin instructions. Take 3.75 mg by mouth in the evening on Sun/Mon/Wed/Thurs/Fri/Sat and 7.5 on Tues) 30 tablet 0  . Accu-Chek Softclix Lancets lancets     . atorvastatin (LIPITOR) 20 MG tablet TAKE 1 TABLET BY MOUTH DAILY AT 6 PM. (Patient not taking: No sig reported) 30 tablet 6  . Blood Glucose Monitoring Suppl (FIFTY50  GLUCOSE METER 2.0) w/Device KIT Use as instructed    . feeding supplement (ENSURE ENLIVE / ENSURE PLUS) LIQD Take 237 mLs by mouth 2 (two) times daily between meals. 237 mL 0  . INSUPEN ULTRAFIN 31G X 8 MM MISC       ROS: Constitutional: No fever or chills Vision: No changes in vision ENT: No difficulty swallowing CV: No chest pain Pulm: No SOB or wheezing GI: No nausea or vomiting GU: No urgency or inability to hold urine Skin: No poor wound healing Neurologic: No numbness or tingling Psychiatric: No depression or anxiety Heme: No bruising Allergic: No reaction to medications or food   Exam: Blood pressure 115/89, pulse 98, temperature 97.9 F (36.6 C), temperature source Oral, resp. rate 18, weight 80.4 kg, SpO2 100 %. General: No acute distress Orientation: Awake alert and oriented x3 Mood and Affect: Cooperative and pleasant Gait: Unable to assess due to his fracture Coordination and balance: Within normal limits  Left lower extremity: Obvious  deformity with shortening and external rotation of the left leg.  He has been placed in Buck's traction.  Compartments are soft compressible.  He has active dorsiflexion plantarflexion.  He has sensation intact light touch.  He has warm well-perfused foot with 2+ DP pulses.  Unable to perform instability exam.  Global tenderness about the thigh.  He does have a small area that looks like an old scar on the anterior portion of his leg that is not fluctuant and is tender to touch.  Right lower extremity: Skin without lesions. No tenderness to palpation. Full painless ROM, full strength in each muscle groups without evidence of instability.   Medical Decision Making: Data: Imaging: X-rays are reviewed which shows a left femoral shaft fracture with significant displacement and comminution  Labs:  Results for orders placed or performed during the hospital encounter of 10/30/20 (from the past 24 hour(s))  Type and screen [place]     Status: None   Collection Time: 10/30/20 11:42 AM  Result Value Ref Range   ABO/RH(D) A POS    Antibody Screen NEG    Sample Expiration      11/02/2020,2359 Performed at [facility], [place] [address], [place], [place] [address]   Comprehensive metabolic panel     Status: Abnormal   Collection Time: 10/30/20 11:51 AM  Result Value Ref Range   Sodium 141 135 - 145 mmol/L   Potassium 4.4 3.5 - 5.1 mmol/L   Chloride 109 98 - 111 mmol/L   CO2 22 22 - 32 mmol/L   Glucose, Bld 294 (H) 70 - 99 mg/dL   BUN 19 6 - 20 mg/dL   Creatinine, Ser 1.41 (H) 0.61 - 1.24 mg/dL   Calcium 8.9 8.9 - 10.3 mg/dL   Total Protein 5.8 (L) 6.5 - 8.1 g/dL   Albumin 2.8 (L) 3.5 - 5.0 g/dL   AST 33 15 - 41 U/L   ALT 25 0 - 44 U/L   Alkaline Phosphatase 95 38 - 126 U/L   Total Bilirubin 1.0 0.3 - 1.2 mg/dL   GFR, Estimated 58 (L) >60 mL/min   Anion gap 10 5 - 15  CBC     Status: Abnormal   Collection Time: 10/30/20 11:51 AM  Result Value Ref Range   WBC 10.3  4.0 - 10.5 K/uL   RBC 4.01 (L) 4.22 - 5.81 MIL/uL   Hemoglobin 11.9 (L) 13.0 - 17.0 g/dL   HCT 39.5 39.0 - 52.0 %  MCV 98.5 80.0 - 100.0 fL   MCH 29.7 26.0 - 34.0 pg   MCHC 30.1 30.0 - 36.0 g/dL   RDW 14.3 11.5 - 15.5 %   Platelets 198 150 - 400 K/uL   nRBC 0.0 0.0 - 0.2 %  Urinalysis, Routine w reflex microscopic     Status: Abnormal   Collection Time: 10/30/20 11:51 AM  Result Value Ref Range   Color, Urine YELLOW YELLOW   APPearance CLEAR CLEAR   Specific Gravity, Urine 1.018 1.005 - 1.030   pH 5.0 5.0 - 8.0   Glucose, UA >=500 (A) NEGATIVE mg/dL   Hgb urine dipstick MODERATE (A) NEGATIVE   Bilirubin Urine NEGATIVE NEGATIVE   Ketones, ur NEGATIVE NEGATIVE mg/dL   Protein, ur 100 (A) NEGATIVE mg/dL   Nitrite NEGATIVE NEGATIVE   Leukocytes,Ua NEGATIVE NEGATIVE   RBC / HPF 0-5 0 - 5 RBC/hpf   WBC, UA 0-5 0 - 5 WBC/hpf   Bacteria, UA RARE (A) NONE SEEN   Mucus PRESENT   Protime-INR     Status: Abnormal   Collection Time: 10/30/20 11:51 AM  Result Value Ref Range   Prothrombin Time 21.1 (H) 11.4 - 15.2 seconds   INR 1.9 (H) 0.8 - 1.2  Resp Panel by RT-PCR (Flu A&B, Covid) Nasopharyngeal Swab     Status: Abnormal   Collection Time: 10/30/20 11:56 AM   Specimen: Nasopharyngeal Swab; Nasopharyngeal(NP) swabs in vial transport medium  Result Value Ref Range   SARS Coronavirus 2 by RT PCR POSITIVE (A) NEGATIVE   Influenza A by PCR NEGATIVE NEGATIVE   Influenza B by PCR NEGATIVE NEGATIVE  Urine rapid drug screen (hosp performed)     Status: Abnormal   Collection Time: 10/30/20  3:18 PM  Result Value Ref Range   Opiates NONE DETECTED NONE DETECTED   Cocaine POSITIVE (A) NONE DETECTED   Benzodiazepines NONE DETECTED NONE DETECTED   Amphetamines NONE DETECTED NONE DETECTED   Tetrahydrocannabinol NONE DETECTED NONE DETECTED   Barbiturates NONE DETECTED NONE DETECTED  Surgical pcr screen     Status: None   Collection Time: 10/30/20  4:30 PM   Specimen: Nasal Mucosa; Nasal Swab   Result Value Ref Range   MRSA, PCR NEGATIVE NEGATIVE   Staphylococcus aureus NEGATIVE NEGATIVE  Glucose, capillary     Status: Abnormal   Collection Time: 10/30/20  5:06 PM  Result Value Ref Range   Glucose-Capillary 189 (H) 70 - 99 mg/dL  Hepatitis B surface antigen     Status: None   Collection Time: 10/30/20  5:33 PM  Result Value Ref Range   Hepatitis B Surface Ag NON REACTIVE NON REACTIVE  Ferritin     Status: None   Collection Time: 10/30/20  5:33 PM  Result Value Ref Range   Ferritin 146 24 - 336 ng/mL  Fibrinogen     Status: None   Collection Time: 10/30/20  5:33 PM  Result Value Ref Range   Fibrinogen 467 210 - 475 mg/dL  C-reactive protein     Status: None   Collection Time: 10/30/20  5:33 PM  Result Value Ref Range   CRP 0.6 <1.0 mg/dL  D-dimer, quantitative (not at [facility])     Status: Abnormal   Collection Time: 10/30/20  5:33 PM  Result Value Ref Range   D-Dimer, Quant 5.40 (H) 0.00 - 0.50 ug/mL-FEU  Lactate dehydrogenase     Status: Abnormal   Collection Time: 10/30/20  8:00 PM  Result Value Ref Range  LDH 295 (H) 98 - 192 U/L  Procalcitonin     Status: None   Collection Time: 10/30/20  8:00 PM  Result Value Ref Range   Procalcitonin <0.10 ng/mL  Brain natriuretic peptide     Status: Abnormal   Collection Time: 10/30/20  8:00 PM  Result Value Ref Range   B Natriuretic Peptide 795.1 (H) 0.0 - 100.0 pg/mL  Glucose, capillary     Status: Abnormal   Collection Time: 10/30/20  9:05 PM  Result Value Ref Range   Glucose-Capillary 249 (H) 70 - 99 mg/dL  Glucose, capillary     Status: Abnormal   Collection Time: 10/31/20  6:00 AM  Result Value Ref Range   Glucose-Capillary 261 (H) 70 - 99 mg/dL  Protime-INR     Status: Abnormal   Collection Time: 10/31/20  7:31 AM  Result Value Ref Range   Prothrombin Time 16.8 (H) 11.4 - 15.2 seconds   INR 1.4 (H) 0.8 - 1.2  CBC with Differential/Platelet     Status: Abnormal   Collection Time: 10/31/20  7:31 AM  Result  Value Ref Range   WBC 11.7 (H) 4.0 - 10.5 K/uL   RBC 3.60 (L) 4.22 - 5.81 MIL/uL   Hemoglobin 10.8 (L) 13.0 - 17.0 g/dL   HCT 35.9 (L) 39.0 - 52.0 %   MCV 99.7 80.0 - 100.0 fL   MCH 30.0 26.0 - 34.0 pg   MCHC 30.1 30.0 - 36.0 g/dL   RDW 14.4 11.5 - 15.5 %   Platelets 170 150 - 400 K/uL   nRBC 0.0 0.0 - 0.2 %   Neutrophils Relative % 80 %   Neutro Abs 9.3 (H) 1.7 - 7.7 K/uL   Lymphocytes Relative 11 %   Lymphs Abs 1.3 0.7 - 4.0 K/uL   Monocytes Relative 6 %   Monocytes Absolute 0.7 0.1 - 1.0 K/uL   Eosinophils Relative 2 %   Eosinophils Absolute 0.2 0.0 - 0.5 K/uL   Basophils Relative 0 %   Basophils Absolute 0.0 0.0 - 0.1 K/uL   Immature Granulocytes 1 %   Abs Immature Granulocytes 0.07 0.00 - 0.07 K/uL  Comprehensive metabolic panel     Status: Abnormal   Collection Time: 10/31/20  7:31 AM  Result Value Ref Range   Sodium 137 135 - 145 mmol/L   Potassium 3.9 3.5 - 5.1 mmol/L   Chloride 108 98 - 111 mmol/L   CO2 19 (L) 22 - 32 mmol/L   Glucose, Bld 241 (H) 70 - 99 mg/dL   BUN 16 6 - 20 mg/dL   Creatinine, Ser 1.15 0.61 - 1.24 mg/dL   Calcium 8.5 (L) 8.9 - 10.3 mg/dL   Total Protein 5.4 (L) 6.5 - 8.1 g/dL   Albumin 2.5 (L) 3.5 - 5.0 g/dL   AST 18 15 - 41 U/L   ALT 22 0 - 44 U/L   Alkaline Phosphatase 91 38 - 126 U/L   Total Bilirubin 0.9 0.3 - 1.2 mg/dL   GFR, Estimated >60 >60 mL/min   Anion gap 10 5 - 15  C-reactive protein     Status: Abnormal   Collection Time: 10/31/20  7:31 AM  Result Value Ref Range   CRP 2.7 (H) <1.0 mg/dL  D-dimer, quantitative (not at [facility])     Status: Abnormal   Collection Time: 10/31/20  7:31 AM  Result Value Ref Range   D-Dimer, Quant 2.02 (H) 0.00 - 0.50 ug/mL-FEU  Ferritin     Status:  None   Collection Time: 10/31/20  7:31 AM  Result Value Ref Range   Ferritin 158 24 - 336 ng/mL  Magnesium     Status: Abnormal   Collection Time: 10/31/20  7:31 AM  Result Value Ref Range   Magnesium 1.6 (L) 1.7 - 2.4 mg/dL  Phosphorus     Status:  None   Collection Time: 10/31/20  7:31 AM  Result Value Ref Range   Phosphorus 3.4 2.5 - 4.6 mg/dL  Glucose, capillary     Status: Abnormal   Collection Time: 10/31/20 11:20 AM  Result Value Ref Range   Glucose-Capillary 155 (H) 70 - 99 mg/dL     Imaging or Labs ordered: None  Medical history and chart was reviewed and case discussed with medical provider.  Assessment/Plan: 60 year old male with lung cancer who is also on Coumadin with a history of coronary artery disease and congestive heart failure that presents with a left femoral shaft fracture  Patient will require intramedullary nailing of his left femur.  Risks and benefits were discussed with the patient.  Risks included but not limited to bleeding, infection, malunion, nonunion, hardware failure, hardware irritation, nerve or blood vessel injury, DVT, even the possibility of anesthetic complications.  The patient agreed to proceed with surgery and consent was obtained.  Shona Needles, MD [facility] [phone] (office) [email or web address]

## 2020-10-31 NOTE — Progress Notes (Signed)
Pharmacy Consult - Post-op Fracture Care  65 yom presenting with L femoral shaft fracture, now s/p OR for repair 1/19. Pharmacy consulted to resume PTA warfarin for hx CVA on POD1 with no bridge needed if Hg stable and no bleeding issues. INR down to 1.4 today. Hg down to 10.8, plt wnl.  Plan: Resume warfarin 1/20 with no bridge if no bleeding issues noted Monitor daily INR, CBC, s/sx bleeding   Arturo Morton, PharmD, BCPS Please check AMION for all [facility] contact numbers Clinical Pharmacist 10/31/2020 3:26 PM

## 2020-10-31 NOTE — Progress Notes (Signed)
Spoke with nurse regarding PORT access. Awaiting MD orders. Fran Lowes, RN VAST

## 2020-10-31 NOTE — Progress Notes (Signed)
Initial Nutrition Assessment  DOCUMENTATION CODES:   Not applicable  INTERVENTION:   -Glucerna (220 kcal, 10 g protein) BID [vanilla] -discontinue Ensure Enlive (350 kcal, 20 g protein) BID  -MVI with minerals daily  NUTRITION DIAGNOSIS:   Increased nutrient needs related to post-op healing as evidenced by estimated needs.  GOAL:   Patient will meet greater than or equal to 90% of their needs  MONITOR:   Supplement acceptance,PO intake,I & O's,Weight trends,Labs,Skin  REASON FOR ASSESSMENT:   Consult Hip fracture protocol  ASSESSMENT:   34 YOM with PMH of CAD, CHF, ischemic cardiomyopathy, AICD, DM2, stroke, lung cancer mets to brain. Pt admitted 10/30/20 for fall, x-ray indicated left femur fx. Pt underwent surgery for fx. Pt positive for COVID-19.   Pt s/p intramedullary (IM) Nail Femoral (left).  Pt indicated he has been enjoying his meals and has been eating most of his tray.Documented meal completion shows he has been consuming 100% of his trays. Pt indicated he has been consuming Ensure Enlive BID.   Pt discussed normal diet at home consists of 3 meals/day with snacks.  Breakfast: grits, eggs, milk  Lunch: fish and shrimp or pizza with a side salad Dinner: rice and chicken, but varies (typically: meat, starch and vegetable) Snacks: blueberry muffins or peanut butter crackers (Nabs)  RD discussed with patient his elevated blood sugar levels and explained reasons as to why it could be elevated (medication, hospital stay, COVID). Pt indicated that he had previously gained approximately 40 #. Reviewed weight history, noted weight gain trend over the past 4-5 months, pt has gained almost 30 # during this time period.  RD discussed how Glucerna and/or Magic Cups (with his meals) could be a better option for keeping his levels within a normal range. Pt indicated he had previously had Glucerna and was willing to try it, but preferred vanilla.  Discussed importance of good  meal intake and supplements to promote his healing.  Noticed in exam Muehrcke's Lines. Noted pt has undergone chemotherapy for lung cancer with mets to the brain.   Meds reviewed: vitamin C - 500 mg (1x/day), Ensure Enlive BID, LASIX (20 mg), decadron (4 mg) BID, zinc sulfate (220 mg) (1x/day)  Labs reviewed: 01/19: glucose (393 mg/dL), glucose-capillary (365 mg/dL)  I&O reviewed: 01/18: intake - 240 mL, urine output - 350 mL, 01/20: urine output 300 mL   NUTRITION - FOCUSED PHYSICAL EXAM:  Flowsheet Row Most Recent Value  Orbital Region No depletion  Upper Arm Region No depletion  Thoracic and Lumbar Region No depletion  Buccal Region No depletion  Temple Region No depletion  Clavicle Bone Region No depletion  Clavicle and Acromion Bone Region No depletion  Scapular Bone Region No depletion  Dorsal Hand Moderate depletion  Patellar Region No depletion  Anterior Thigh Region Moderate depletion  Posterior Calf Region Moderate depletion  Edema (RD Assessment) None  Hair Reviewed  Eyes Reviewed  Mouth Reviewed  Skin Reviewed  Nails Reviewed  [Nails were brittle and ridged.]        Diet Order:   Diet Order             Diet Heart Room service appropriate? Yes; Fluid consistency: Thin  Diet effective now                   EDUCATION NEEDS:   No education needs have been identified at this time  Skin:  Skin Assessment: Skin Integrity Issues: Skin Integrity Issues:: Incisions Incisions: Closed incision on  left leg (10/31/20)   Last BM:  10/28/20  Height:   Ht Readings from Last 1 Encounters:  10/31/20 6' (1.829 m)    Weight:   Wt Readings from Last 1 Encounters:  11/01/20 79.2 kg    Ideal Body Weight:  81 kg  BMI:  Body mass index is 23.68 kg/m.  Estimated Nutritional Needs:   Kcal:  2000-2400  Protein:  95-110  Fluid:  >2 L   Salvadore Oxford, Dietetic Intern 11/01/2020 12:44 PM

## 2020-10-31 NOTE — Anesthesia Procedure Notes (Signed)
Procedure Name: Intubation Date/Time: 10/31/2020 12:39 PM Performed by: Moshe Salisbury, CRNA Pre-anesthesia Checklist: Patient identified, Emergency Drugs available, Suction available and Patient being monitored Patient Re-evaluated:Patient Re-evaluated prior to induction Oxygen Delivery Method: Circle System Utilized Preoxygenation: Pre-oxygenation with 100% oxygen Induction Type: IV induction and Rapid sequence Laryngoscope Size: Mac and 4 Grade View: Grade I Tube type: Oral Tube size: 7.5 mm Number of attempts: 1 Airway Equipment and Method: Stylet Placement Confirmation: ETT inserted through vocal cords under direct vision,  positive ETCO2 and breath sounds checked- equal and bilateral Secured at: 22 cm Tube secured with: Tape Dental Injury: Teeth and Oropharynx as per pre-operative assessment

## 2020-10-31 NOTE — Plan of Care (Signed)

## 2020-10-31 NOTE — Anesthesia Postprocedure Evaluation (Signed)
Anesthesia Post Note  Patient: Younes Degeorge.  Procedure(s) Performed: INTRAMEDULLARY (IM) RETROGRADE FEMORAL NAILING (Left )     Patient location during evaluation: Other (OR) Anesthesia Type: General Level of consciousness: awake and alert Pain management: pain level controlled Vital Signs Assessment: post-procedure vital signs reviewed and stable Respiratory status: spontaneous breathing, nonlabored ventilation, respiratory function stable and patient connected to nasal cannula oxygen Cardiovascular status: blood pressure returned to baseline and stable Postop Assessment: no apparent nausea or vomiting Anesthetic complications: no   No complications documented.  Last Vitals:  Vitals:   10/31/20 1449 10/31/20 1521  BP: (!) 118/92 125/83  Pulse: (!) 101 88  Resp: 15   Temp:  36.8 C  SpO2: 92% 94%    Last Pain:  Vitals:   10/31/20 1610  TempSrc:   PainSc: [place]

## 2020-10-31 NOTE — Consult Note (Addendum)
Cardiology Consultation:   Patient ID: [place]. MRN: [id number]; DOB: 1960-10-18  Admit date: 10/30/2020 Date of Consult: 10/31/2020  Primary Care Provider: [name], MD [facility] Cardiologist: Glori Bickers, MD  [facility] Electrophysiologist:  None    Patient Profile:   Sigurd Pugh. is a 60 y.o. male with a PMH of CAD with totally occluded LAD with collaterals, mixed ICM/NICM with chronic combined CHF with EF 20-25% due to ventricular non-compaction on chronic coumadin, s/p ICD, CVA in 2016, HLD, DM type 2, and former polysubstance abuse (tobacco, cocaine, and ETOH - sober since 2016 though still smoking 1/2 ppd), and metastatic lung cancer s/p XRT and currently on chemotherapy who is being seen today for the evaluation of CHF at the request of Dr. [place] Pink.  History of Present Illness:   Mr. Prettyman was in his usual state of health until a few days ago when he developed an intermittently productive cough over the past 2 months. He reported recent contact with this brother and sister-in-law who tested positive for COVID-19 10/30/20. Patient was confirmed to be COVID-19 positive this admission. Unfortunately he suffered a fall on the ice 10/30/20 landing on his butt with immediate deformity to his left leg with significant pain and inability to move his leg. He did not have any head strike or loss of consciousness. EMS was activated and he was brought to the ED where he was found to have a left femur fracture. He was evaluated by ortho with plans for surgical repair after coumadin reversal. He was taken to the OR 10/31/20 for IMN of his left femur fracture which occurred without complications. Cardiology asked to follow given CHF history.   He was seen by Dr. Lovena Le 09/28/20 for follow-up of his ICD which had reached ERI, though still with 3 months before EOL. Decision made for ongoing watchful waiting and if patient had a good response to chemotherapy, would consider  placing another ICD at that time. Prior to this he was seen in the advanced heart failure clinic by Darrick Grinder, NP 09/13/20 in follow-up of a recent hospitalization for recurrent syncope, at which time his heart failure medications were discontinued and he was started on midodrine for BP support. At his follow-up visit he had mild DOE though felt to have a stable volume status, therefore no medication changes occurred at that time and he was recommended to follow-up in 6 weeks (scheduled to see Dr. Haroldine Laws 11/08/20).    His last echocardiogram 06/2020 showed EF 25-30%, diffuse hypokinesis, mild concentric LVH, moderately dilated LV, G2DD, small pericardial effusion, and mild MR. His last ischemic evaluation was a NST in 2019 which was without ischemia, though high risk due to low EF.   Patient notes that he feels fine after surgery.  No CP, SOB, PND, Orthopnea.  Notes that when he has decompensated HF he gets bloated.  No bloating presently.  Past Medical History:  Diagnosis Date  . Alcohol abuse with alcohol-induced mood disorder ([place]) 08/18/2015  . Automatic implantable cardioverter-defibrillator in situ 2012   S/P St Jude Dual Chamber. ICD.  Marland Kitchen CAD (coronary artery disease) 11/16/2014   CAD Coronary angiography (12/2008) with mid LAD totally occluded and collateralized.  . Cardiac LV ejection fraction 10-20%   . Chest pain 09/04/2018  . CHF (congestive heart failure) ([place])   . Chronic systolic heart failure (HCC)    a) Mixed ICM/NICM b) RHC (05/2014): RA 2, RV 19/2/3, PA 22/14 (18), PCWP 6, Fick CO/CI: 5.2 /  2.7, PVR 2.3 WU, PA 60% and 64% c) ECHO (05/2014): EF 20-25%, diff HK, akinesis entireanteroseptal myocardium, triv AI, mod MR, LA mod/sev dilated  . Cocaine abuse ([place]) 01/24/2015  . Cocaine abuse with cocaine-induced mood disorder ([place]) 08/20/2015  . Drug abuse and dependence ([place])   . Dysphagia following cerebral infarction 01/24/2015  . Embolic stroke involving right middle cerebral artery  ([place])   . 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 H/O noncompliance with medical treatment, presenting hazards to health 01/24/2015  . Heart murmur   . High cholesterol   . Ischemic cardiomyopathy    a) Coronary angiography (12/2008) at [facility]: [place]: nl, LAD mid 100% stenosis with left to left and right-to-left collaterals to the distal LAD; Lcx: nl, RCA nl.    . 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[place])   . MDD (major depressive disorder), recurrent severe, without psychosis ([place]) 08/18/2015  . Open wound of foot 02/27/2015  . Pneumonia 1988  . Psychoactive substance-induced mood disorder ([place]) 07/17/2015  . Sleep apnea    "cleared after T&A"  . Status post PICC central line placement   . Stroke ([place])   . Substance induced mood disorder ([place]) 08/17/2015  . Type II diabetes mellitus ([place])     Past Surgical History:  Procedure Laterality Date  . BRONCHIAL BRUSHINGS  12/28/2019   Procedure: BRONCHIAL BRUSHINGS;  Surgeon: Garner Nash, DO;  Location: [facility];  Service: Thoracic;;  . BRONCHIAL NEEDLE ASPIRATION BIOPSY  12/28/2019   Procedure: BRONCHIAL NEEDLE ASPIRATION BIOPSIES;  Surgeon: Garner Nash, DO;  Location: [facility];  Service: Thoracic;;  . BRONCHIAL WASHINGS  12/28/2019   Procedure: BRONCHIAL WASHINGS;  Surgeon: Garner Nash, DO;  Location: [facility];  Service: Thoracic;;  . CARDIAC CATHETERIZATION  05/2014  . CARDIAC DEFIBRILLATOR PLACEMENT  03/2011  . CHOLECYSTECTOMY  1994  . FOREARM FRACTURE SURGERY Left 1980  . FRACTURE SURGERY    . IR IMAGING GUIDED PORT INSERTION  10/11/2020  . RIGHT HEART CATHETERIZATION N/A 05/30/2014   Procedure: RIGHT HEART CATH;  Surgeon: Jolaine Artist, MD;  Location: [facility] CATH LAB;  Service: Cardiovascular;  Laterality: N/A;  . RIGHT HEART CATHETERIZATION N/A 02/07/2015   Procedure: RIGHT HEART CATH;  Surgeon: Jolaine Artist, MD;  Location: [facility] CATH LAB;  Service: Cardiovascular;  Laterality: N/A;   . TONSILLECTOMY AND ADENOIDECTOMY  ~ 1998  . VIDEO BRONCHOSCOPY WITH ENDOBRONCHIAL ULTRASOUND N/A 12/28/2019   Procedure: VIDEO BRONCHOSCOPY WITH ENDOBRONCHIAL ULTRASOUND;  Surgeon: Garner Nash, DO;  Location: [facility];  Service: Thoracic;  Laterality: N/A;     Home Medications:  Prior to Admission medications   Medication Sig Start Date End Date Taking? Authorizing Provider  dexamethasone (DECADRON) 2 MG tablet Take 1 tablet (2 mg total) by mouth daily. 08/27/20  Yes Vaslow, Acey Lav, MD  insulin glargine (LANTUS SOLOSTAR) 100 UNIT/ML Solostar Pen Inject 25 Units into the skin 2 (two) times daily. Patient taking differently: Inject 25 Units into the skin in the morning and at bedtime. 07/25/20  Yes Eugenie Filler, MD  insulin lispro (HUMALOG KWIKPEN) 100 UNIT/ML KwikPen Inject 6 Units into the skin 3 (three) times daily. Patient taking differently: Inject 6 Units into the skin 3 (three) times daily with meals. 07/25/20  Yes Eugenie Filler, MD  lidocaine-prilocaine (EMLA) cream Apply 1 application topically as needed. Patient taking differently: Apply 1 application topically as needed (as directed). 09/24/20  Yes Heilingoetter, Cassandra L, PA-C  midodrine (PROAMATINE)  10 MG tablet Take 1 tablet (10 mg total) by mouth 3 (three) times daily with meals. 07/25/20  Yes Eugenie Filler, MD  prochlorperazine (COMPAZINE) 10 MG tablet Take 10 mg by mouth every 6 (six) hours as needed for nausea or vomiting.   Yes [provider]  sennosides-docusate sodium (SENOKOT-S) 8.6-50 MG tablet Take 1 tablet by mouth daily as needed for constipation.   Yes [provider]  sodium chloride 1 g tablet Take 2 tablets (2 g total) by mouth 3 (three) times daily with meals. Patient taking differently: Take 1 g by mouth in the morning. 07/25/20  Yes Eugenie Filler, MD  tamsulosin (FLOMAX) 0.4 MG CAPS capsule Take 0.4 mg by mouth daily. 10/22/20  Yes [provider]   warfarin (COUMADIN) 7.5 MG tablet TAKE 1 TABLET BY MOUTH DAILY AT 6PM. Patient taking differently: Take 3.75-7.5 mg by mouth See admin instructions. Take 3.75 mg by mouth in the evening on Sun/Mon/Wed/Thurs/Fri/Sat and 7.5 on Tues 01/20/20  Yes Bensimhon, Shaune Pascal, MD  Accu-Chek Softclix Lancets lancets  02/03/20   [provider]  atorvastatin (LIPITOR) 20 MG tablet TAKE 1 TABLET BY MOUTH DAILY AT 6 PM. Patient not taking: No sig reported 03/11/17   Bensimhon, Shaune Pascal, MD  Blood Glucose Monitoring Suppl (FIFTY50 GLUCOSE METER 2.0) w/Device KIT Use as instructed 02/03/20   [provider]  feeding supplement (ENSURE ENLIVE / ENSURE PLUS) LIQD Take 237 mLs by mouth 2 (two) times daily between meals. 07/26/20   Eugenie Filler, MD  Erda X 8 MM MISC  03/02/20   [provider]    Inpatient Medications: Scheduled Meds: . [MAR Hold] albuterol  2 puff Inhalation BID  . [MAR Hold] vitamin C  500 mg Oral Daily  . [MAR Hold] atorvastatin  20 mg Oral Daily  . [MAR Hold] Chlorhexidine Gluconate Cloth  6 each Topical Daily  . [MAR Hold] dexamethasone (DECADRON) injection  4 mg Intravenous Q12H  . [MAR Hold] feeding supplement  237 mL Oral BID BM  . [MAR Hold] furosemide  20 mg Intravenous Once  . [MAR Hold] insulin aspart  0-9 Units Subcutaneous TID WC  . [MAR Hold] insulin aspart  6 Units Subcutaneous TID WC  . [MAR Hold] insulin glargine  5 Units Subcutaneous BID  . [MAR Hold] midodrine  10 mg Oral TID WC  . povidone-iodine  2 application Topical Once  . [MAR Hold] senna  1 tablet Oral BID  . [MAR Hold] sodium chloride  1 g Oral q AM  . [MAR Hold] tamsulosin  0.4 mg Oral Daily  . [MAR Hold] zinc sulfate  220 mg Oral Daily   Continuous Infusions: . [MAR Hold] magnesium sulfate bolus IVPB    . [MAR Hold] remdesivir 200 mg in sodium chloride 0.9% 250 mL IVPB     Followed by  . [MAR Hold] remdesivir 100 mg in NS 100 mL     PRN Meds: 0.9 % irrigation  (POUR BTL), [MAR Hold] albuterol, [MAR Hold] chlorpheniramine-HYDROcodone, [MAR Hold] guaiFENesin-dextromethorphan, [MAR Hold] HYDROcodone-acetaminophen, [MAR Hold] lidocaine-prilocaine, [MAR Hold]  morphine injection, [MAR Hold] senna-docusate, vancomycin  Allergies:   No Known Allergies  Social History:   Social History   Socioeconomic History  . Marital status: Single    Spouse name: Not on file  . Number of children: 1  . Years of education: 38  . Highest education level: Not on file  Occupational History  . Occupation: Disabled  Tobacco  Use  . Smoking status: Former Smoker    Packs/day: 0.25    Years: 31.00    Pack years: 7.75    Types: Cigarettes  . Smokeless tobacco: Never Used  Vaping Use  . Vaping Use: Never used  Substance and Sexual Activity  . Alcohol use: Not Currently    Alcohol/week: 0.0 standard drinks    Comment: 09/21/2014 "might have a drink q 6 /months, if that"  . Drug use: Yes    Types: Cocaine    Comment: last use 07/06/2020  . Sexual activity: Yes  Other Topics Concern  . Not on file  Social History Narrative   Lives in [place] with his mother. No pets. Fun: movies, sports, daughter.    Denies religious beliefs that would effect health care.    Social Determinants of Health   Financial Resource Strain: Not on file  Food Insecurity: Not on file  Transportation Needs: Not on file  Physical Activity: Not on file  Stress: Not on file  Social Connections: Not on file  Intimate Partner Violence: Not on file    Family History:    Family History  Problem Relation Age of Onset  . Diabetes Mother   . Heart disease Mother   . Diabetes Father   . Prostate cancer Father   . Heart disease Father   . Diabetes Brother   . Diabetes Brother      ROS:  Please see the history of present illness.  Notes hunger but no other symptoms. All other ROS reviewed and negative.     Physical Exam/Data:   Vitals:   10/30/20 2045 10/31/20 0500 10/31/20  0602 10/31/20 1147  BP: 121/89  115/89   Pulse: 99  98   Resp: 20  18   Temp: 97.9 F (36.6 C)  97.9 F (36.6 C)   TempSrc: Oral  Oral   SpO2: 97%  100%   Weight:  80.4 kg  85.3 kg  Height:    6' (1.829 m)    Intake/Output Summary (Last 24 hours) at 10/31/2020 1434 Last data filed at 10/31/2020 1424 Gross per 24 hour  Intake 1140.13 ml  Output 700 ml  Net 440.13 ml   Last 3 Weights 10/31/2020 10/31/2020 10/11/2020  Weight (lbs) 188 lb 177 lb 4 oz 177 lb 4 oz  Weight (kg) 85.276 kg 80.4 kg 80.4 kg  Some encounter information is confidential and restricted. Go to Review Flowsheets activity to see all data.     Body mass index is 25.5 kg/m.   Physical Exam General: well developed well nourished male in NAD Neck:  No JVD Resp: Good air movement and CTAB  No wheezing or rhonchi. Cardio: Normal S1, S2  No rubs murmurs, gallops, or bruits GI: Abdomen is soft.  Nontender nondistended.  No fluid waive and with hyperactive BS Extremities: Left lower extremity is immobilized, right extremity has no edema Neurologic: No focal neurological deficits Psych:  Pleasant mood, affect  EKG:  The EKG was personally reviewed and demonstrates:  None this admission  Relevant CV Studies: NST 2019:  Nuclear stress EF: 13%.  The left ventricular ejection fraction is severely decreased (<30%).  There was no ST segment deviation noted during stress.  Findings consistent with prior myocardial infarction.  This is a high risk study.   Severe LVE with diffuse hypokinesis EF estimated at 13% Small distal anteroapical wall infarct with no ischemia Study high risk due to EF    Echocardiogram 06/2020 1. Since the  last exam on 12/25/2019 LVEF has slightly improved from  15-20% to 25-30% with diffuse hypokinesis.   2. Left ventricular ejection fraction, by estimation, is 25 to 30%. The  left ventricle has severely decreased function. The left ventricle  demonstrates global hypokinesis. The left  ventricular internal cavity size  was moderately dilated. There is mild  concentric left ventricular hypertrophy. Left ventricular diastolic  parameters are consistent with Grade II diastolic dysfunction  (pseudonormalization). Elevated left atrial pressure.   3. Right ventricular systolic function is normal. The right ventricular  size is normal. There is normal pulmonary artery systolic pressure.   4. A small pericardial effusion is present. The pericardial effusion is  posterior to the left ventricle.   5. The mitral valve is normal in structure. Mild mitral valve  regurgitation. No evidence of mitral stenosis.   6. The aortic valve is normal in structure. Aortic valve regurgitation is  trivial. No aortic stenosis is present.   7. The inferior vena cava is normal in size with greater than 50%  respiratory variability, suggesting right atrial pressure of 3 mmHg.   Laboratory Data:  High Sensitivity Troponin:  No results for input(s): TROPONINIHS in the last 720 hours.   Chemistry Recent Labs  Lab 10/30/20 1151 10/31/20 0731  NA 141 137  K 4.4 3.9  CL 109 108  CO2 22 19*  GLUCOSE 294* 241*  BUN 19 16  CREATININE 1.41* 1.15  CALCIUM 8.9 8.5*  GFRNONAA 58* >60  ANIONGAP 10 10    Recent Labs  Lab 10/30/20 1151 10/31/20 0731  PROT 5.8* 5.4*  ALBUMIN 2.8* 2.5*  AST 33 18  ALT 25 22  ALKPHOS 95 91  BILITOT 1.0 0.9   Hematology Recent Labs  Lab 10/30/20 1151 10/31/20 0731  WBC 10.3 11.7*  RBC 4.01* 3.60*  HGB 11.9* 10.8*  HCT 39.5 35.9*  MCV 98.5 99.7  MCH 29.7 30.0  MCHC 30.1 30.1  RDW 14.3 14.4  PLT 198 170   BNP Recent Labs  Lab 10/30/20 2000  BNP 795.1*    DDimer  Recent Labs  Lab 10/30/20 1733 10/31/20 0731  DDIMER 5.40* 2.02*     Radiology/Studies:  DG Pelvis Portable  Result Date: 10/30/2020 CLINICAL DATA:  Fall EXAM: PORTABLE PELVIS 1-2 VIEWS COMPARISON:  CT PET of January 17, 2020 FINDINGS: Degenerative changes about the bilateral hips.  Irregularity along the iliopectineal line on the LEFT.  Osteopenia. No displaced fractures. IMPRESSION: 1. Irregularity along the LEFT iliopectineal line, suspicious for nondisplaced fracture about the LEFT acetabulum. 2. Degenerative changes about the hips. Electronically Signed   By: Zetta Bills M.D.   On: 10/30/2020 13:03   DG CHEST PORT 1 VIEW  Result Date: 10/30/2020 CLINICAL DATA:  Fall. EXAM: PORTABLE CHEST 1 VIEW COMPARISON:  CT 09/17/2020.  Chest x-ray 10/07/2019. FINDINGS: Port-A-Cath noted with lead tip over the right ventricle. Cardiac pacer with lead tips over the right atrium right ventricle. Cardiomegaly. Diffuse bilateral interstitial prominence. Small left pleural effusion. Findings most consistent CHF. Pneumonitis cannot be excluded. IMPRESSION: 1.  Port-A-Cath noted with lead tip over the right ventricle. 2. Cardiac pacer with lead tips over the right atrium and right ventricle. Cardiomegaly with diffuse bilateral interstitial prominence and small left pleural effusion. Electronically Signed   By: Marcello Moores  Register   On: 10/30/2020 15:28   DG Tibia/Fibula Left Port  Result Date: 10/30/2020 CLINICAL DATA:  Fall, deformity of the knee. EXAM: PORTABLE LEFT TIBIA AND FIBULA - 2 VIEW COMPARISON:  Femoral and pelvic evaluation of the same date. FINDINGS: Overlapping stabilization device limiting assessment. No signs of fracture of the tibia or fibula. No joint effusion noted incidentally in the knee. Incidental note made of mid to distal femoral fracture at the upper margin of the submitted radiographs. Question of medial patellar subluxation versus is artifact from oblique nature of the submitted image. This could also be related to the severity of the fracture in the femur above. IMPRESSION: 1. No signs of fracture of the tibia or fibula. 2. Incidental note is made of mid to distal femoral fracture at the upper margin of the submitted radiographs. 3. Question medial patellar subluxation  versus artifact from oblique nature of the submitted image. Electronically Signed   By: Zetta Bills M.D.   On: 10/30/2020 13:06   DG FEMUR PORT 1V LEFT  Result Date: 10/30/2020 CLINICAL DATA:  Fall, deformity about the knee. EXAM: LEFT FEMUR PORTABLE 1 VIEW COMPARISON:  None FINDINGS: Distal femoral diaphyseal spiral fracture with butterfly fragment and a half shaft with medial displacement of the distal fracture fragment, mild apex medial angulation and moderate wapex anterior angulation at the fracture site. Complete displacement and over riding noted on the lateral view, distal fracture fragment dorsally displaced relative to proximal femur. On lateral view dorsal displacement of fracture fragments are in close proximity to calcified femoral vasculature. Lateral view of the LEFT hip/proximal femur with limited evaluation due to overlap of stabilization device. IMPRESSION: Displaced and angulated spiral fracture of the distal femoral diaphysis as described. Limited evaluation of the LEFT hip/proximal femur due to overlap of stabilization device. Markedly displaced mid to distal femoral fracture is in close proximity to calcified femoral vasculature. Consider CT for vascular assessment as clinically warranted. Electronically Signed   By: Zetta Bills M.D.   On: 10/30/2020 12:56     Assessment and Plan:   1. Chronic combined CHF with mixed NICM/ICM and LV non-compaction: patient presented with left leg pain after falling on the ice resulting in a left femur fracture. Taken to the OR today for surgical repair. Also found to be COVID-19 positive after recent exposure from family. CXR showed small left pleural effusion with diffuse bilateral interstitial prominence. BNP 795. No EKG obtained this admission. He has been off his heart failure medications for several months now due to recurrent syncope and orthostatic hypotension.  - Continue to monitor volume status closely in the post-operative setting;  reasonable for lasix 20 mg IV, but may not need lasix tomorrow as he has had near syncope in the past with HF medications - Continue midodrine for BP support - Continue to monitor strict I&Os and daily weights - Anticipate restarting coumadin tomorrow if Hgb stable  2. CAD with known total occlusion of LAD: has known CTO of LAD with collaterals on LHC in 2010 with stress test in 2019 without ischemia. No complaints of chest pain. No EKG obtained this admission. Not on aspirin given need for anticoagulation. Not on BBlocker due to issues with orthostasis and syncope - Continue statin  3. S/p ICD: nearing ERI. To follow-up with Dr. Lovena Le outpatient to determine if device will be replaced pending response to ongoing chemotherapy - Continue outpatient follow-up with EP  4. Left femur fracture: occurred after slipping on the ice. Now s/p IMN by ortho today.  - Continue management per primary team and ortho  5. COVID-19: reported intermittent coughing x3 days with positive family members at home. Now on remdesivir and supportive care with steroids,  cough suppressants, and nebulizers - Continue management per primary team  6. Metastatic lung cancer: s/p whole brain XRT with ongoing chemo.  - Continue management per outpatient oncology   New York Heart Association (NYHA) Functional Class NYHA Class II   Will continue to follow.   For questions or updates, please contact [place] Please consult [email or web address] for contact info under   [address], [address] [place], [place] [id number] [phone]  5:44 PM

## 2020-11-01 DIAGNOSIS — I5022 Chronic systolic (congestive) heart failure: Secondary | ICD-10-CM | POA: Diagnosis not present

## 2020-11-01 DIAGNOSIS — F1410 Cocaine abuse, uncomplicated: Secondary | ICD-10-CM

## 2020-11-01 DIAGNOSIS — U071 COVID-19: Secondary | ICD-10-CM | POA: Diagnosis not present

## 2020-11-01 DIAGNOSIS — C7931 Secondary malignant neoplasm of brain: Secondary | ICD-10-CM | POA: Diagnosis not present

## 2020-11-01 LAB — CBC WITH DIFFERENTIAL/PLATELET
Abs Immature Granulocytes: 0.06 10*3/uL (ref 0.00–0.07)
Basophils Absolute: 0 10*3/uL (ref 0.0–0.1)
Basophils Relative: 0 %
Eosinophils Absolute: 0 10*3/uL (ref 0.0–0.5)
Eosinophils Relative: 0 %
HCT: 31.1 % — ABNORMAL LOW (ref 39.0–52.0)
Hemoglobin: 9.5 g/dL — ABNORMAL LOW (ref 13.0–17.0)
Immature Granulocytes: 1 %
Lymphocytes Relative: 7 %
Lymphs Abs: 0.9 10*3/uL (ref 0.7–4.0)
MCH: 29.9 pg (ref 26.0–34.0)
MCHC: 30.5 g/dL (ref 30.0–36.0)
MCV: 97.8 fL (ref 80.0–100.0)
Monocytes Absolute: 0.6 10*3/uL (ref 0.1–1.0)
Monocytes Relative: 5 %
Neutro Abs: 10.9 10*3/uL — ABNORMAL HIGH (ref 1.7–7.7)
Neutrophils Relative %: 87 %
Platelets: 177 10*3/uL (ref 150–400)
RBC: 3.18 MIL/uL — ABNORMAL LOW (ref 4.22–5.81)
RDW: 14.3 % (ref 11.5–15.5)
WBC: 12.4 10*3/uL — ABNORMAL HIGH (ref 4.0–10.5)
nRBC: 0 % (ref 0.0–0.2)

## 2020-11-01 LAB — D-DIMER, QUANTITATIVE: D-Dimer, Quant: 3.18 ug/mL-FEU — ABNORMAL HIGH (ref 0.00–0.50)

## 2020-11-01 LAB — COMPREHENSIVE METABOLIC PANEL
ALT: 18 U/L (ref 0–44)
AST: 16 U/L (ref 15–41)
Albumin: 2.2 g/dL — ABNORMAL LOW (ref 3.5–5.0)
Alkaline Phosphatase: 80 U/L (ref 38–126)
Anion gap: 10 (ref 5–15)
BUN: 22 mg/dL — ABNORMAL HIGH (ref 6–20)
CO2: 24 mmol/L (ref 22–32)
Calcium: 8.2 mg/dL — ABNORMAL LOW (ref 8.9–10.3)
Chloride: 105 mmol/L (ref 98–111)
Creatinine, Ser: 1.5 mg/dL — ABNORMAL HIGH (ref 0.61–1.24)
GFR, Estimated: 54 mL/min — ABNORMAL LOW (ref >60)
Glucose, Bld: 393 mg/dL — ABNORMAL HIGH (ref 70–99)
Potassium: 4.5 mmol/L (ref 3.5–5.1)
Sodium: 139 mmol/L (ref 135–145)
Total Bilirubin: 0.7 mg/dL (ref 0.3–1.2)
Total Protein: 5.2 g/dL — ABNORMAL LOW (ref 6.5–8.1)

## 2020-11-01 LAB — GLUCOSE, CAPILLARY
Glucose-Capillary: 365 mg/dL — ABNORMAL HIGH (ref 70–99)
Glucose-Capillary: 227 mg/dL — ABNORMAL HIGH (ref 70–99)
Glucose-Capillary: 187 mg/dL — ABNORMAL HIGH (ref 70–99)
Glucose-Capillary: 221 mg/dL — ABNORMAL HIGH (ref 70–99)

## 2020-11-01 LAB — FERRITIN: Ferritin: 173 ng/mL (ref 24–336)

## 2020-11-01 LAB — PROTIME-INR
INR: 1.4 — ABNORMAL HIGH (ref 0.8–1.2)
Prothrombin Time: 16.6 seconds — ABNORMAL HIGH (ref 11.4–15.2)

## 2020-11-01 LAB — C-REACTIVE PROTEIN: CRP: 6.5 mg/dL — ABNORMAL HIGH (ref <1.0)

## 2020-11-01 LAB — PHOSPHORUS: Phosphorus: 4.7 mg/dL — ABNORMAL HIGH (ref 2.5–4.6)

## 2020-11-01 LAB — MAGNESIUM: Magnesium: 1.6 mg/dL — ABNORMAL LOW (ref 1.7–2.4)

## 2020-11-01 MED ORDER — ADULT MULTIVITAMIN W/MINERALS CH
1.0000 | ORAL_TABLET | Freq: Every day | ORAL | Status: DC
  Administered 2020-11-01 – 2020-11-28 (×28): 1 via ORAL
  Filled 2020-11-01 (×27): qty 1

## 2020-11-01 MED ORDER — INSULIN ASPART 100 UNIT/ML ~~LOC~~ SOLN
0.0000 [IU] | Freq: Three times a day (TID) | SUBCUTANEOUS | Status: DC
  Administered 2020-11-01 – 2020-11-02 (×2): 4 [IU] via SUBCUTANEOUS
  Administered 2020-11-02: 11 [IU] via SUBCUTANEOUS
  Administered 2020-11-02: 4 [IU] via SUBCUTANEOUS
  Administered 2020-11-03: 7 [IU] via SUBCUTANEOUS
  Administered 2020-11-03 (×2): 4 [IU] via SUBCUTANEOUS
  Administered 2020-11-04 (×2): 7 [IU] via SUBCUTANEOUS
  Administered 2020-11-04: 4 [IU] via SUBCUTANEOUS
  Administered 2020-11-05: 11 [IU] via SUBCUTANEOUS
  Administered 2020-11-05: 7 [IU] via SUBCUTANEOUS
  Administered 2020-11-05: 15 [IU] via SUBCUTANEOUS
  Administered 2020-11-06: 11 [IU] via SUBCUTANEOUS
  Administered 2020-11-06 (×2): 4 [IU] via SUBCUTANEOUS
  Administered 2020-11-07 – 2020-11-08 (×2): 3 [IU] via SUBCUTANEOUS
  Administered 2020-11-08 – 2020-11-10 (×3): 4 [IU] via SUBCUTANEOUS

## 2020-11-01 MED ORDER — INSULIN GLARGINE 100 UNIT/ML ~~LOC~~ SOLN
10.0000 [IU] | Freq: Once | SUBCUTANEOUS | Status: AC
  Administered 2020-11-01: 10 [IU] via SUBCUTANEOUS
  Filled 2020-11-01: qty 0.1

## 2020-11-01 MED ORDER — WARFARIN SODIUM 7.5 MG PO TABS
7.5000 mg | ORAL_TABLET | Freq: Once | ORAL | Status: AC
  Administered 2020-11-01: 7.5 mg via ORAL
  Filled 2020-11-01: qty 1

## 2020-11-01 MED ORDER — GLUCERNA SHAKE PO LIQD
237.0000 mL | Freq: Two times a day (BID) | ORAL | Status: DC
  Administered 2020-11-01 – 2020-11-28 (×42): 237 mL via ORAL
  Filled 2020-11-01 (×3): qty 237

## 2020-11-01 MED ORDER — WARFARIN - PHARMACIST DOSING INPATIENT: Freq: Every day | Status: DC

## 2020-11-01 MED ORDER — MORPHINE SULFATE (PF) 2 MG/ML IV SOLN: 0.5000 mg | INTRAVENOUS | Status: DC | PRN

## 2020-11-01 MED ORDER — MAGNESIUM SULFATE 2 GM/50ML IV SOLN
2.0000 g | Freq: Once | INTRAVENOUS | Status: AC
  Administered 2020-11-01: 2 g via INTRAVENOUS
  Filled 2020-11-01: qty 50

## 2020-11-01 MED ORDER — INSULIN GLARGINE 100 UNIT/ML ~~LOC~~ SOLN
15.0000 [IU] | Freq: Two times a day (BID) | SUBCUTANEOUS | Status: DC
  Administered 2020-11-01 – 2020-11-02 (×2): 15 [IU] via SUBCUTANEOUS
  Filled 2020-11-01 (×3): qty 0.15

## 2020-11-01 MED ORDER — INSULIN ASPART 100 UNIT/ML ~~LOC~~ SOLN
0.0000 [IU] | Freq: Every day | SUBCUTANEOUS | Status: DC
  Administered 2020-11-01 – 2020-11-03 (×2): 2 [IU] via SUBCUTANEOUS
  Administered 2020-11-04: 3 [IU] via SUBCUTANEOUS
  Administered 2020-11-06 – 2020-11-07 (×2): 2 [IU] via SUBCUTANEOUS

## 2020-11-01 MED ORDER — VITAMIN D 25 MCG (1000 UNIT) PO TABS
2000.0000 [IU] | ORAL_TABLET | Freq: Two times a day (BID) | ORAL | Status: DC
  Administered 2020-11-01 – 2020-11-28 (×55): 2000 [IU] via ORAL
  Filled 2020-11-01 (×55): qty 2

## 2020-11-01 NOTE — Plan of Care (Signed)
  Problem: Health Behavior/Discharge Planning: Goal: Ability to manage health-related needs will improve Outcome: Progressing   Problem: Clinical Measurements: Goal: Ability to maintain clinical measurements within normal limits will improve Outcome: Progressing Goal: Will remain free from infection Outcome: Progressing   

## 2020-11-01 NOTE — Progress Notes (Addendum)
Progress Note  Patient Name: Dennis [name]. Date of Encounter: 11/01/2020  [facility] Cardiologist: Glori Bickers, MD   Subjective   No chest pain  Inpatient Medications    Scheduled Meds: . vitamin C  500 mg Oral Daily  . atorvastatin  20 mg Oral Daily  . Chlorhexidine Gluconate Cloth  6 each Topical Daily  . dexamethasone (DECADRON) injection  4 mg Intravenous Q12H  . feeding supplement  237 mL Oral BID BM  . furosemide  20 mg Intravenous Once  . insulin aspart  0-9 Units Subcutaneous TID WC  . insulin aspart  6 Units Subcutaneous TID WC  . insulin glargine  5 Units Subcutaneous BID  . midodrine  10 mg Oral TID WC  . senna  1 tablet Oral BID  . sodium chloride flush  10-40 mL Intracatheter Q12H  . sodium chloride  1 g Oral q AM  . tamsulosin  0.4 mg Oral Daily  . zinc sulfate  220 mg Oral Daily   Continuous Infusions: .  ceFAZolin (ANCEF) IV 2 g (11/01/20 0618)  . magnesium sulfate bolus IVPB    . remdesivir 100 mg in NS 100 mL     PRN Meds: acetaminophen, albuterol, chlorpheniramine-HYDROcodone, guaiFENesin-dextromethorphan, HYDROcodone-acetaminophen, HYDROcodone-acetaminophen, lidocaine-prilocaine, methocarbamol, morphine injection, senna-docusate, sodium chloride flush   Vital Signs    Vitals:   10/31/20 1521 10/31/20 2100 11/01/20 0500 11/01/20 0754  BP: 125/83 105/67 107/82 111/86  Pulse: 88 98 98 100  Resp:    17  Temp: 98.2 F (36.8 C) 98.2 F (36.8 C) 97.7 F (36.5 C) 97.7 F (36.5 C)  TempSrc: Oral Oral Oral Axillary  SpO2: 94% 95% 97% 95%  Weight:   79.2 kg   Height:        Intake/Output Summary (Last 24 hours) at 11/01/2020 0927 Last data filed at 11/01/2020 0300 Gross per 24 hour  Intake 1000 ml  Output 50 ml  Net 950 ml   Last 3 Weights 11/01/2020 10/31/2020 10/31/2020  Weight (lbs) 174 lb 9.7 oz 188 lb 177 lb 4 oz  Weight (kg) 79.2 kg 85.276 kg 80.4 kg  Some encounter information is confidential and restricted. Go to Review  Flowsheets activity to see all data.      Telemetry    SR - Personally Reviewed  ECG    none - Personally Reviewed  Physical Exam   General: well developed well nourished male in NAD Neck:  No JVD Resp: Good air movement and CTAB No wheezing or rhonchi. Cardio: Normal S1, S2 No rubs murmurs, gallops, or bruits GI: Abdomen is soft. Nontender nondistended. No fluid wave though dullness to percussion Extremities: Left lower extremity is immobilized, right extremity has no edema Neurologic: No focal neurological deficits Psych:  Pleasant mood, affect  Labs    High Sensitivity Troponin:  No results for input(s): TROPONINIHS in the last 720 hours.    Chemistry Recent Labs  Lab 10/30/20 1151 10/31/20 0731 11/01/20 0400  NA 141 137 139  K 4.4 3.9 4.5  CL 109 108 105  CO2 22 19* 24  GLUCOSE 294* 241* 393*  BUN 19 16 22*  CREATININE 1.41* 1.15 1.50*  CALCIUM 8.9 8.5* 8.2*  PROT 5.8* 5.4* 5.2*  ALBUMIN 2.8* 2.5* 2.2*  AST 33 18 16  ALT 25 22 18   ALKPHOS 95 91 80  BILITOT 1.0 0.9 0.7  GFRNONAA 58* >60 54*  ANIONGAP 10 10 10      Hematology Recent Labs  Lab 10/30/20 1151  10/31/20 0731 11/01/20 0400  WBC 10.3 11.7* 12.4*  RBC 4.01* 3.60* 3.18*  HGB 11.9* 10.8* 9.5*  HCT 39.5 35.9* 31.1*  MCV 98.5 99.7 97.8  MCH 29.7 30.0 29.9  MCHC 30.1 30.1 30.5  RDW 14.3 14.4 14.3  PLT 198 170 177    BNP Recent Labs  Lab 10/30/20 2000  BNP 795.1*     DDimer  Recent Labs  Lab 10/30/20 1733 10/31/20 0731 11/01/20 0400  DDIMER 5.40* 2.02* 3.18*     Radiology    DG Pelvis Portable  Result Date: 10/30/2020 CLINICAL DATA:  Fall EXAM: PORTABLE PELVIS 1-2 VIEWS COMPARISON:  CT PET of January 17, 2020 FINDINGS: Degenerative changes about the bilateral hips. Irregularity along the iliopectineal line on the LEFT.  Osteopenia. No displaced fractures. IMPRESSION: 1. Irregularity along the LEFT iliopectineal line, suspicious for nondisplaced fracture about the LEFT  acetabulum. 2. Degenerative changes about the hips. Electronically Signed   By: Zetta Bills M.D.   On: 10/30/2020 13:03   DG CHEST PORT 1 VIEW  Result Date: 10/30/2020 CLINICAL DATA:  Fall. EXAM: PORTABLE CHEST 1 VIEW COMPARISON:  CT 09/17/2020.  Chest x-ray 10/07/2019. FINDINGS: Port-A-Cath noted with lead tip over the right ventricle. Cardiac pacer with lead tips over the right atrium right ventricle. Cardiomegaly. Diffuse bilateral interstitial prominence. Small left pleural effusion. Findings most consistent CHF. Pneumonitis cannot be excluded. IMPRESSION: 1.  Port-A-Cath noted with lead tip over the right ventricle. 2. Cardiac pacer with lead tips over the right atrium and right ventricle. Cardiomegaly with diffuse bilateral interstitial prominence and small left pleural effusion. Electronically Signed   By: Marcello Moores  Register   On: 10/30/2020 15:28   DG Tibia/Fibula Left Port  Result Date: 10/30/2020 CLINICAL DATA:  Fall, deformity of the knee. EXAM: PORTABLE LEFT TIBIA AND FIBULA - 2 VIEW COMPARISON:  Femoral and pelvic evaluation of the same date. FINDINGS: Overlapping stabilization device limiting assessment. No signs of fracture of the tibia or fibula. No joint effusion noted incidentally in the knee. Incidental note made of mid to distal femoral fracture at the upper margin of the submitted radiographs. Question of medial patellar subluxation versus is artifact from oblique nature of the submitted image. This could also be related to the severity of the fracture in the femur above. IMPRESSION: 1. No signs of fracture of the tibia or fibula. 2. Incidental note is made of mid to distal femoral fracture at the upper margin of the submitted radiographs. 3. Question medial patellar subluxation versus artifact from oblique nature of the submitted image. Electronically Signed   By: Zetta Bills M.D.   On: 10/30/2020 13:06   DG C-Arm 1-60 Min  Result Date: 10/31/2020 CLINICAL DATA:  Left femoral  nail placement. EXAM: DG C-ARM 1-60 MIN; LEFT FEMUR 2 VIEWS FLUOROSCOPY TIME:  Fluoroscopy Time:  2 minutes and 52 seconds Reported radiation: 18 mGy. COMPARISON:  Radiographs from October 30, 2020 FINDINGS: Eight C-arm fluoroscopic images were obtained intraoperatively and submitted for post operative interpretation. These images demonstrate intramedullary nail and screw fixation of a spiral fracture of the distal femoral diaphysis. Improved alignment. No unexpected findings. Please see the performing provider's procedural report for further detail. IMPRESSION: Intraoperative fluoroscopic imaging, as detailed above. Electronically Signed   By: Margaretha Sheffield MD   On: 10/31/2020 14:39   DG FEMUR PORT 1V LEFT  Result Date: 10/30/2020 CLINICAL DATA:  Fall, deformity about the knee. EXAM: LEFT FEMUR PORTABLE 1 VIEW COMPARISON:  None FINDINGS: Distal femoral diaphyseal  spiral fracture with butterfly fragment and a half shaft with medial displacement of the distal fracture fragment, mild apex medial angulation and moderate w[place]x anterior angulation at the fracture site. Complete displacement and over riding noted on the lateral view, distal fracture fragment dorsally displaced relative to proximal femur. On lateral view dorsal displacement of fracture fragments are in close proximity to calcified femoral vasculature. Lateral view of the LEFT hip/proximal femur with limited evaluation due to overlap of stabilization device. IMPRESSION: Displaced and angulated spiral fracture of the distal femoral diaphysis as described. Limited evaluation of the LEFT hip/proximal femur due to overlap of stabilization device. Markedly displaced mid to distal femoral fracture is in close proximity to calcified femoral vasculature. Consider CT for vascular assessment as clinically warranted. Electronically Signed   By: Zetta Bills M.D.   On: 10/30/2020 12:56   DG FEMUR MIN 2 VIEWS LEFT  Result Date: 10/31/2020 CLINICAL DATA:   Left femoral nail placement. EXAM: DG C-ARM 1-60 MIN; LEFT FEMUR 2 VIEWS FLUOROSCOPY TIME:  Fluoroscopy Time:  2 minutes and 52 seconds Reported radiation: 18 mGy. COMPARISON:  Radiographs from October 30, 2020 FINDINGS: Eight C-arm fluoroscopic images were obtained intraoperatively and submitted for post operative interpretation. These images demonstrate intramedullary nail and screw fixation of a spiral fracture of the distal femoral diaphysis. Improved alignment. No unexpected findings. Please see the performing provider's procedural report for further detail. IMPRESSION: Intraoperative fluoroscopic imaging, as detailed above. Electronically Signed   By: Margaretha Sheffield MD   On: 10/31/2020 14:39   DG FEMUR PORT MIN 2 VIEWS LEFT  Result Date: 10/31/2020 CLINICAL DATA:  Postop fixation of a mid distal femur fracture. EXAM: LEFT FEMUR PORTABLE 2 VIEWS COMPARISON:  Radiographs, same date. FINDINGS: There is retrograde intramedullary rod in the femur with 2 proximal and 3 distal interlocking screws transfixing the complex comminuted femur fracture which is in good position and alignment. IMPRESSION: Internal fixation and near anatomic alignment of a complex comminuted femur fracture. Electronically Signed   By: Marijo Sanes M.D.   On: 10/31/2020 16:02    Cardiac Studies   NST 2019:  Nuclear stress EF: 13%.  The left ventricular ejection fraction is severely decreased (<30%).  There was no ST segment deviation noted during stress.  Findings consistent with prior myocardial infarction.  This is a high risk study.  Severe LVE with diffuse hypokinesis EF estimated at 13% Small distal anteroapical wall infarct with no ischemia Study high risk due to EF    Echocardiogram 06/2020 1. Since the last exam on 12/25/2019 LVEF has slightly improved from  15-20% to 25-30% with diffuse hypokinesis.  2. Left ventricular ejection fraction, by estimation, is 25 to 30%. The  left ventricle has severely  decreased function. The left ventricle  demonstrates global hypokinesis. The left ventricular internal cavity size  was moderately dilated. There is mild  concentric left ventricular hypertrophy. Left ventricular diastolic  parameters are consistent with Grade II diastolic dysfunction  (pseudonormalization). Elevated left atrial pressure.  3. Right ventricular systolic function is normal. The right ventricular  size is normal. There is normal pulmonary artery systolic pressure.  4. A small pericardial effusion is present. The pericardial effusion is  posterior to the left ventricle.  5. The mitral valve is normal in structure. Mild mitral valve  regurgitation. No evidence of mitral stenosis.  6. The aortic valve is normal in structure. Aortic valve regurgitation is  trivial. No aortic stenosis is present.  7. The inferior vena cava is  normal in size with greater than 50%  respiratory variability, suggesting right atrial pressure of 3 mmHg.    Patient Profile     60 y.o. 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  Assessment & Plan    Chronic combined CHF with mixed NICM/ICM and LV non-compaction: patient presented with left leg pain after falling on the ice resulting in a left femur fracture. Taken to the OR 10/31/20 for surgical repair. Also found to be COVID-19 positive after recent exposure from family. CXR showed small left pleural effusion with diffuse bilateral interstitial prominence. BNP 795. No EKG obtained this admission. He has been off his heart failure medications for several months now due to recurrent  syncope and orthostatic hypotension.  - Continue midodrine for BP support - Continue to monitor strict I&Os and daily weights per I&O +490 and wt down from 85.3 Kg to 79.2 Kg not sure how accurate - Anticipate restarting coumadin tomorrow if Hgb stable (today Hgb 9.5)  INR 1.4  -CKD 3 with rise in Cr from 1.15 to 1.50   - magnesium was replaced  CAD with known total occlusion of LAD: has known CTO of LAD with collaterals on LHC in 2010 with stress test in 2019 without ischemia. No complaints of chest pain. No EKG obtained this admission. Not on aspirin given need for anticoagulation. Not on BBlocker due to issues with orthostasis and syncope - Continue statin  S/p ICD: reached ERI  08/17/20. Has 2 months before EOL To follow-up with Dr. Lovena Le outpatient to determine if device will be replaced pending response to ongoing chemotherapy device in  VVI 40 BPM  Reprogrammed 09/28/20 - Continue outpatient follow-up with EP  Left femur fracture: occurred after slipping on the ice. Now s/p IMN by ortho today.  - Continue management per primary team and ortho  COVID-19: reported intermittent coughing x3 days with positive family members at home. Now on remdesivir and supportive care with steroids, cough suppressants, and nebulizers - Continue management per primary team  Metastatic lung cancer: s/p whole brain XRT with ongoing chemo.  - Continue management per outpatient oncology   New York Heart Association (NYHA) Functional Class NYHA Class I  [facility] will sign off.   Medication Recommendations:  At this time can defer additional lasix; happy to re-evaluate if new symptoms occur Other recommendations (labs, testing, etc):  none Follow up as an outpatient:  Has appointment 11/08/20 for follow up   For questions or updates, please contact [place] Please consult [email or web address] for contact info under        Werner Lean, MD

## 2020-11-01 NOTE — TOC CAGE-AID Note (Signed)
Transition of Care (TOC) - CAGE-AID Screening   Patient Details  Name: Dennis [name]. MRN: [id number] Date of Birth: December 24, 1960  Transition of Care [facility]) CM/SW Contact:    Iran Planas, RN, TRN Phone Number: Trauma Services 11/01/2020, 9:57 AM  CAGE-AID Screening:    Have You Ever Felt You Ought to Cut Down on Your Drinking or Drug Use?: No Have People Annoyed You By Critizing Your Drinking Or Drug Use?: No Have You Felt Bad Or Guilty About Your Drinking Or Drug Use?: No Have You Ever Had a Drink or Used Drugs First Thing In The Morning to Steady Your Nerves or to Get Rid of a Hangover?: No CAGE-AID Score: 0

## 2020-11-01 NOTE — Progress Notes (Addendum)
Inpatient Diabetes Program Recommendations  AACE/ADA: New Consensus Statement on Inpatient Glycemic Control (2015)  Target Ranges:  Prepandial:   less than 140 mg/dL      Peak postprandial:   less than 180 mg/dL (1-2 hours)      Critically ill patients:  140 - 180 mg/dL   Results for INIOLUWA, BARIS (MRN [id number]) as of 11/01/2020 10:17  Ref. Range 10/31/2020 06:00 10/31/2020 11:20 10/31/2020 16:35 10/31/2020 22:15  Glucose-Capillary Latest Ref Range: 70 - 99 mg/dL 261 (H)  5 units NOVOLOG @8 :45am  5 units LANTUS @8 :46am 155 (H)  2 units NOVOLOG  161 (H)  8 units NOVOLOG @5 :57pm 301 (H)      5 units LANTUS   Results for MALAKHI, MARKWOOD (MRN [id number]) as of 11/01/2020 10:17  Ref. Range 11/01/2020 06:23  Glucose-Capillary Latest Ref Range: 70 - 99 mg/dL 365 (H)    Admit Distal femoral fracture secondary to fall/ COVID+  History: DM, CHF, Cocaine Abuse, CVA, Lung Cancer   Home Meds: Lantus 25 units BID            Humalog 6 units TID            Also takes Decadron at home  Current Orders: Lantus 5 units BID      Novolog 0-9 units TID       Novolog 6 units TID      Decadron 4 mg BID    MD- Note CBG 365 this AM  Please consider increasing Lantus to 15 units BID (60% total home dose)    --Will follow patient during hospitalization--  Wyn Quaker RN, MSN, CDE Diabetes Coordinator Inpatient Glycemic Control Team Team Pager: [phone] (8a-5p)

## 2020-11-01 NOTE — Evaluation (Signed)
Physical Therapy Evaluation Patient Details Name: Dennis [name]. MRN: [id number] DOB: 11-24-1960 Today's Date: 11/01/2020   History of Present Illness  Patient is a 60 y/o male with PMH significant for IDDM, HTN, HLD, CVA in 2016, lung cancer, and CAD on warfarin. Patient had mechanical fall resulting in L femur fx. Patient s/p IM nail L femur on 1/19.  Clinical Impression  PTA, patient lives with mother and brother and reports independence with mobility. Patient requires modA for bed mobility this session as well as modA+2 for sit to stand transfer with RW. Patient ambulated 5' with minA+2 and RW, assist required for RW management. Patient presents with decreased activity tolerance, impaired balance, generalized weakness, and impaired functional mobility. Patient will benefit from skilled PT services during acute stay to address listed deficits. Recommend SNF at this time following discharge to maximize functional mobility and independence.     Follow Up Recommendations SNF;Supervision/Assistance - 24 hour    Equipment Recommendations  Rolling walker with 5" wheels    Recommendations for Other Services       Precautions / Restrictions Precautions Precautions: Fall Restrictions Weight Bearing Restrictions: Yes LLE Weight Bearing: Weight bearing as tolerated      Mobility  Bed Mobility Overal bed mobility: Needs Assistance Bed Mobility: Supine to Sit     Supine to sit: Mod assist     General bed mobility comments: modA for trunk advancement to EOB    Transfers Overall transfer level: Needs assistance Equipment used: Rolling walker (2 wheeled) Transfers: Sit to/from Stand Sit to Stand: Mod assist;+2 physical assistance;+2 safety/equipment         General transfer comment: modA+2 for boost up, cues for hand placement for pushing up from bed  Ambulation/Gait Ambulation/Gait assistance: Min assist;+2 physical assistance;+2 safety/equipment (or modA+1) Gait Distance  (Feet): 5 Feet Assistive device: Rolling walker (2 wheeled) Gait Pattern/deviations: Step-to pattern;Decreased stride length;Decreased stance time - left;Antalgic Gait velocity: decreased   General Gait Details: cues and assist for RW management and directions. Patient with uncontrolled descent into chair, required cues for reaching back for chair prior to sitting  Stairs            Wheelchair Mobility    Modified Rankin (Stroke Patients Only)       Balance Overall balance assessment: Needs assistance Sitting-balance support: Feet supported;No upper extremity supported Sitting balance-Leahy Scale: Fair     Standing balance support: Bilateral upper extremity supported;During functional activity Standing balance-Leahy Scale: Poor Standing balance comment: reliant on UE support                             Pertinent Vitals/Pain Pain Assessment: Faces Faces Pain Scale: Hurts little more Pain Location: L LE Pain Descriptors / Indicators: Grimacing;Guarding Pain Intervention(s): Monitored during session    Home Living Family/patient expects to be discharged to:: Private residence Living Arrangements: Parent;Other relatives (mom and brother) Available Help at Discharge: Family Type of Home: House Home Access: Stairs to enter Entrance Stairs-Rails: Right Entrance Stairs-Number of Steps: 2 Home Layout: Two level;Able to live on main level with bedroom/bathroom;1/2 bath on main level Home Equipment: Hospital bed;Cane - single point;Bedside commode      Prior Function Level of Independence: Independent         Comments: 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     [place] Hand: Right    Extremity/Trunk Assessment   Upper Extremity Assessment Upper  Extremity Assessment: Defer to OT evaluation    Lower Extremity Assessment Lower Extremity Assessment: Generalized weakness;LLE deficits/detail LLE: Unable to fully assess due to  pain       Communication   Communication: No difficulties  Cognition Arousal/Alertness: Awake/alert Behavior During Therapy: WFL for tasks assessed/performed Overall Cognitive Status: Within Functional Limits for tasks assessed                                        General Comments      Exercises     Assessment/Plan    PT Assessment Patient needs continued PT services  PT Problem List Decreased strength;Decreased range of motion;Decreased balance;Decreased mobility;Decreased activity tolerance;Decreased safety awareness       PT Treatment Interventions DME instruction;Gait training;Stair training;Functional mobility training;Therapeutic activities;Therapeutic exercise;Balance training;Patient/family education    PT Goals (Current goals can be found in the Care Plan section)  Acute Rehab PT Goals Patient Stated Goal: to go home PT Goal Formulation: With patient Time For Goal Achievement: 11/15/20 Potential to Achieve Goals: Fair    Frequency Min 3X/week   Barriers to discharge        Co-evaluation PT/OT/SLP Co-Evaluation/Treatment: Yes Reason for Co-Treatment: For patient/therapist safety;To address functional/ADL transfers PT goals addressed during session: Mobility/safety with mobility;Balance;Proper use of DME         AM-PAC PT "6 Clicks" Mobility  Outcome Measure Help needed turning from your back to your side while in a flat bed without using bedrails?: A Lot Help needed moving from lying on your back to sitting on the side of a flat bed without using bedrails?: A Lot Help needed moving to and from a bed to a chair (including a wheelchair)?: A Lot Help needed standing up from a chair using your arms (e.g., wheelchair or bedside chair)?: A Lot Help needed to walk in hospital room?: A Little Help needed climbing 3-5 steps with a railing? : A Little 6 Click Score: 14    End of Session Equipment Utilized During Treatment: Gait belt Activity  Tolerance: Patient tolerated treatment well Patient left: in chair;with chair alarm set;with call bell/phone within reach Nurse Communication: Mobility status PT Visit Diagnosis: Unsteadiness on feet (R26.81);Other abnormalities of gait and mobility (R26.89);Muscle weakness (generalized) (M62.81);History of falling (Z91.81)    Time: 3832-9191 PT Time Calculation (min) (ACUTE ONLY): 28 min   Charges:   PT Evaluation $PT Eval Moderate Complexity: 1 Mod          Alayna A. Gilford Rile PT, DPT Acute Rehabilitation Services Pager [phone] Office [phone]   Alda [name] 11/01/2020, 4:00 PM

## 2020-11-01 NOTE — Evaluation (Signed)
Occupational Therapy Evaluation Patient Details Name: Dennis [name]. MRN: [id number] DOB: Jun 05, 1961 Today's Date: 11/01/2020    History of Present Illness Patient is a 60 y/o male with PMH significant for IDDM, HTN, HLD, CVA in 2016, lung cancer, and CAD on warfarin. Patient had mechanical fall resulting in L femur fx. Patient s/p IM nail L femur on 1/19.   Clinical Impression   Pt presents with decline in function and safety with ADLs and ADL mobility with impaired strength, balance and endurance. PTA, patient lived with mother and brother and reports that he was Ind with ADLs/selfcare and mobility. Pt currently requires mod A for bed mobility as well as mod A+2 for sit to stand transfer with RW. Pt required max - total A with LB selfcare and toileting. Pt would benefit from acute OT services to address impairments to maximize level of function and safety     Follow Up Recommendations  SNF    Equipment Recommendations  Other (comment) (TBD at next venue of care, RW)    Recommendations for Other Services       Precautions / Restrictions Precautions Precautions: Fall Restrictions Weight Bearing Restrictions: Yes LLE Weight Bearing: Weight bearing as tolerated      Mobility Bed Mobility Overal bed mobility: Needs Assistance Bed Mobility: Supine to Sit     Supine to sit: Mod assist     General bed mobility comments: modA for trunk advancement to EOB    Transfers Overall transfer level: Needs assistance Equipment used: Rolling walker (2 wheeled) Transfers: Sit to/from Stand Sit to Stand: Mod assist;+2 physical assistance;+2 safety/equipment         General transfer comment: modA+2 for boost up, cues for hand placement for pushing up from bed    Balance Overall balance assessment: Needs assistance Sitting-balance support: Feet supported;No upper extremity supported Sitting balance-Leahy Scale: Fair     Standing balance support: Bilateral upper extremity  supported;During functional activity Standing balance-Leahy Scale: Poor Standing balance comment: reliant on UE support                           ADL either performed or assessed with clinical judgement   ADL Overall ADL's : Needs assistance/impaired Eating/Feeding: Set up;Independent;Sitting   Grooming: Wash/dry hands;Wash/dry face;Min guard;Sitting   Upper Body Bathing: Min guard;Sitting   Lower Body Bathing: Maximal assistance   Upper Body Dressing : Min guard;Sitting   Lower Body Dressing: Total assistance   Toilet Transfer: Moderate assistance;+2 for physical assistance;Ambulation;RW;Cueing for safety;Cueing for sequencing   Toileting- Clothing Manipulation and Hygiene: Total assistance       Functional mobility during ADLs: Moderate assistance;+2 for physical assistance;Cueing for safety;Cueing for sequencing       Vision Baseline Vision/History: No visual deficits Patient Visual Report: No change from baseline       Perception     Praxis      Pertinent Vitals/Pain Pain Assessment: Faces Faces Pain Scale: Hurts little more Pain Location: L LE Pain Descriptors / Indicators: Grimacing;Guarding Pain Intervention(s): Monitored during session;Repositioned     Hand Dominance Right   Extremity/Trunk Assessment Upper Extremity Assessment Upper Extremity Assessment: Overall WFL for tasks assessed   Lower Extremity Assessment Lower Extremity Assessment: Defer to PT evaluation LLE: Unable to fully assess due to pain   Cervical / Trunk Assessment Cervical / Trunk Assessment: Normal   Communication Communication Communication: No difficulties   Cognition Arousal/Alertness: Awake/alert Behavior During Therapy: WFL for tasks assessed/performed Overall  Cognitive Status: Within Functional Limits for tasks assessed                                     General Comments       Exercises     Shoulder Instructions      Home Living  Family/patient expects to be discharged to:: Private residence Living Arrangements: Parent;Other relatives Available Help at Discharge: Family;Available PRN/intermittently Type of Home: House Home Access: Stairs to enter [employer] of Steps: 2 Entrance Stairs-Rails: Right Home Layout: Two level;Able to live on main level with bedroom/bathroom;1/2 bath on main level     Bathroom Shower/Tub: Tub/shower unit;Curtain (upstairs)   Bathroom Toilet: Standard     Home Equipment: Hospital bed;Cane - single point;Bedside commode          Prior Functioning/Environment Level of Independence: Independent        Comments: 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        OT Problem List: Decreased strength;Impaired balance (sitting and/or standing);Pain;Decreased safety awareness;Decreased activity tolerance;Decreased coordination;Decreased knowledge of use of DME or AE      OT Treatment/Interventions: Self-care/ADL training;Therapeutic exercise;Patient/family education;Balance training;DME and/or AE instruction;Therapeutic activities    OT Goals(Current goals can be found in the care plan section) Acute Rehab OT Goals Patient Stated Goal: to go home OT Goal Formulation: With patient Time For Goal Achievement: 11/15/20 Potential to Achieve Goals: Good ADL Goals Pt Will Perform Grooming: with supervision;with set-up;sitting Pt Will Perform Upper Body Bathing: with supervision;with set-up;sitting Pt Will Perform Lower Body Bathing: with mod assist;sitting/lateral leans;sit to/from stand Pt Will Perform Upper Body Dressing: with supervision;with set-up;sitting Pt Will Transfer to Toilet: with mod assist;with min assist;ambulating;bedside commode Pt Will Perform Toileting - Clothing Manipulation and hygiene: with mod assist;sit to/from stand  OT Frequency: Min 2X/week   Barriers to D/C:            Co-evaluation PT/OT/SLP Co-Evaluation/Treatment: Yes Reason for  Co-Treatment: For patient/therapist safety;To address functional/ADL transfers PT goals addressed during session: Mobility/safety with mobility;Balance;Proper use of DME OT goals addressed during session: ADL's and self-care;Proper use of Adaptive equipment and DME      AM-PAC OT "6 Clicks" Daily Activity     Outcome Measure Help from another person eating meals?: None Help from another person taking care of personal grooming?: A Little Help from another person toileting, which includes using toliet, bedpan, or urinal?: A Lot Help from another person bathing (including washing, rinsing, drying)?: A Lot Help from another person to put on and taking off regular upper body clothing?: A Little Help from another person to put on and taking off regular lower body clothing?: Total 6 Click Score: 15   End of Session Equipment Utilized During Treatment: Gait belt;Rolling walker Nurse Communication: Mobility status  Activity Tolerance: Patient tolerated treatment well Patient left: in chair;with call bell/phone within reach;with chair alarm set  OT Visit Diagnosis: Unsteadiness on feet (R26.81);Other abnormalities of gait and mobility (R26.89);History of falling (Z91.81);Pain Pain - Right/Left: Left Pain - part of body: Leg                Time: 9390-3009 OT Time Calculation (min): 29 min Charges:  OT General Charges $OT Visit: 1 Visit OT Evaluation $OT Eval Moderate Complexity: 1 Mod    Britt Bottom 11/01/2020, 4:32 PM

## 2020-11-01 NOTE — Progress Notes (Signed)
ANTICOAGULATION CONSULT NOTE - Follow Up Consult  Pharmacy Consult for Warfarin Indication: CVA  No Known Allergies  Patient Measurements: Height: 6' (182.9 cm) Weight: 79.2 kg (174 lb 9.7 oz) IBW/kg (Calculated) : 77.6 Heparin Dosing Weight: 85.3 kg  Vital Signs: Temp: 97.7 F (36.5 C) (01/20 0754) Temp Source: Axillary (01/20 0754) BP: 111/86 (01/20 0754) Pulse Rate: 100 (01/20 0754)  Labs: Recent Labs    10/30/20 1151 10/31/20 0731 11/01/20 0400  HGB 11.9* 10.8* 9.5*  HCT 39.5 35.9* 31.1*  PLT 198 170 177  LABPROT 21.1* 16.8* 16.6*  INR 1.9* 1.4* 1.4*  CREATININE 1.41* 1.15 1.50*    Estimated Creatinine Clearance: 58.9 mL/min (A) (by C-G formula based on SCr of 1.5 mg/dL (H)).   Assessment: 65 YOM on warfarin PTA for hx CVA. Admitted with INR 2.5 on PTA dose of 3.75 mg daily EXCEPT for 7.5 mg on Tues (+/- Thurs). Held and reversed for repair of femur fracture with ortho. Bridged with Heparin pre-op. Post-op to resume Warfarin dosing without a bridge.  INR today is 1.4, ortho has okayed restart of warfarin today per their note.   Goal of Therapy:  INR 2-3 Monitor platelets by anticoagulation protocol: Yes   Plan:  - Warfarin 7.5 mg x 1 dose at 1800 today - Daily PT/INR, CBC q72h - Will continue to monitor for any signs/symptoms of bleeding and will follow up with PT/INR in the a.m.    Thank you for allowing pharmacy to be a part of this patient's care.  Alycia Rossetti, PharmD, BCPS Clinical Pharmacist Clinical phone for 11/01/2020: [phone] 11/01/2020 11:56 AM   **Pharmacist phone directory can now be found on [email or web address] ([email or web address]).  Listed under [place].

## 2020-11-01 NOTE — Progress Notes (Signed)
[facility] PROGRESS NOTE   Dennis [name]. VEH:[id number] DOB: Feb 19, 1961 DOA: 10/30/2020  PCP: Dennis Pee, MD  Brief History/Interval Summary: 60 y.o. male with medical history significant of systolic CHF last EF 83-66%, s/p AICD, CAD, right MCA CVA in 06/2020, on Coumadin, small cell lung cancer with mets to brain (dx3/2021), DM type II, cocaine abuse, and remote alcohol abuse who presented after he slipped on ice outside.  He landed on his butt, and immediately saw the deformity to his left leg.  Patient admits that he has had a intermittently productive cough over the last few days.  He had been around his brother and sister-in-law who tested positive for COVID.  Patient reports that he just received his booster shot approximately 2 months ago.  Patient was found to have left distal femur fracture.  He was hospitalized for further management.    Reason for Visit: Left distal femur fracture.  COVID-19 infection  Consultants: Orthopedics.  Cardiology.  Procedures: 1/19: Retrograde intramedullary nailing of left femur fracture   Antibiotics: Anti-infectives (From admission, onward)   Start     Dose/Rate Route Frequency Ordered Stop   11/01/20 1000  remdesivir 100 mg in sodium chloride 0.9 % 100 mL IVPB       "Followed by" Linked Group Details   100 mg 200 mL/hr over 30 Minutes Intravenous Daily 10/31/20 1229 11/03/20 0959   10/31/20 2100  ceFAZolin (ANCEF) IVPB 2g/100 mL premix        2 g 200 mL/hr over 30 Minutes Intravenous Every 8 hours 10/31/20 1512 11/01/20 2159   10/31/20 1600  remdesivir 200 mg in sodium chloride 0.9% 250 mL IVPB       "Followed by" Linked Group Details   200 mg 580 mL/hr over 30 Minutes Intravenous Once 10/31/20 1229 10/31/20 2257   10/31/20 1406  vancomycin (VANCOCIN) powder  Status:  Discontinued          As needed 10/31/20 1411 10/31/20 1452   10/31/20 0600  ceFAZolin (ANCEF) IVPB 2g/100 mL premix        2 g 200 mL/hr over 30 Minutes  Intravenous To Short Stay 10/30/20 1848 10/31/20 1255      Subjective/Interval History: Patient complains of pain in the left leg but not as severe as yesterday.  Continues to have a cough.  Denies any shortness of breath.       Assessment/Plan:  Distal left femur fracture Patient underwent intramedullary nail placement on 1/19.  PT and OT to evaluate.  Pain control.  Further management per orthopedics.    COVID-19 infection/acute respiratory failure with hypoxia He had exposure this past weekend from his brother and sister-in-law who tested positive for COVID-19.  Patient has minimal cough.  Chest x-ray raise concern for interstitial prominence concerning for possible pneumonitis or CHF.  BNP was noted to be elevated at 795.  Patient positive for COVID-19.   Patient was given 1 dose of IV furosemide.  Patient seems to be stable from a respiratory standpoint.  He is also on higher dose of steroids.  He will be given a 3-day course of Remdesivir.   CRP noted to be elevated at 6.5.  D-dimer 3.18.  Continue incentive spirometry mobilization.  Taken off of oxygen this morning and seems to be saturating normal on room air.  Albert [name]    10/30/20 1733 10/30/20 2000 10/31/20 0731 11/01/20 0400  DDIMER 5.40*  --  2.02* 3.18*  FERRITIN 146  --  158 173  LDH  --  295*  --   --   CRP 0.6  --  2.7* 6.5*    Chronic systolic CHF/AICD in situ/chronic anticoagulation EF noted to be 25 to 30% based on echocardiogram done in September 2021.  Strict ins and outs and daily weights.   Cardiology is following.  Appreciate their assistance.  No diuretics listed on his home medication list.  He was given Lasix x1 yesterday.   Patient on warfarin at home.  Was placed on hold due to surgery.  Okay per orthopedics to resume.    History of small cell lung cancer with a metastatic brain lesion Patient followed by Dr. Mickeal [name] and Dr. Julien [name].  Patient underwent whole brain radiation and  chemotherapy.  Supposed to have repeat MRI brain on January 30.  Noted to be on dexamethasone which is being continued.  Anemia of chronic disease/acute blood loss Drop in hemoglobin could be due to operative losses.  Recheck tomorrow.  Orthostatic hypotension Stable.  Continue with midodrine and salt tablets.  Acute kidney injury Creatinine noted to be elevated this morning compared to yesterday.  Could be due to furosemide that was given yesterday.  Avoid nephrotoxic agents.  Recheck labs tomorrow.  Monitor urine output.  Dysphagia Recent speech evaluation revealed mild oropharyngeal dysphagia likely due to his prior stroke as well as brain lesion.  Aspiration precautions.  Diabetes mellitus type 2 uncontrolled with hyperglycemia HbA1c 8.7 in December.  CBGs poorly controlled.  We will go up on his dose of Lantus.  Continue with SSI.    History of stroke Patient with right MCA stroke in September 2021.  Has residual dysarthria and left-sided weakness.  PT and OT evaluation.  History of cocaine abuse Urine drug screen still positive for cocaine.  Patient will need counseling.  Hyperlipidemia Statin.   DVT Prophylaxis: Should be able to reinitiate warfarin Code Status: Full code Family Communication: Discussed with the patient.  No family at bedside Disposition Plan: To be determined  Status is: Inpatient  Remains inpatient appropriate because:Ongoing active pain requiring inpatient pain management, IV treatments appropriate due to intensity of illness or inability to take PO and Inpatient level of care appropriate due to severity of illness   Dispo: The patient is from: Home              Anticipated d/c is to: SNF              Anticipated d/c date is: 3 days              Patient currently is not medically stable to d/c.        Medications:  Scheduled: . vitamin C  500 mg Oral Daily  . atorvastatin  20 mg Oral Daily  . Chlorhexidine Gluconate Cloth  6 each Topical  Daily  . cholecalciferol  2,000 Units Oral BID  . dexamethasone (DECADRON) injection  4 mg Intravenous Q12H  . feeding supplement  237 mL Oral BID BM  . furosemide  20 mg Intravenous Once  . insulin aspart  0-9 Units Subcutaneous TID WC  . insulin aspart  6 Units Subcutaneous TID WC  . insulin glargine  5 Units Subcutaneous BID  . midodrine  10 mg Oral TID WC  . senna  1 tablet Oral BID  . sodium chloride flush  10-40 mL Intracatheter Q12H  . sodium chloride  1 g Oral q AM  . tamsulosin  0.4 mg Oral Daily  . zinc  sulfate  220 mg Oral Daily   Continuous: .  ceFAZolin (ANCEF) IV 2 g (11/01/20 0618)  . magnesium sulfate bolus IVPB    . remdesivir 100 mg in NS 100 mL     XTG:GYIRSWNIOEVOJ, albuterol, chlorpheniramine-HYDROcodone, guaiFENesin-dextromethorphan, HYDROcodone-acetaminophen, HYDROcodone-acetaminophen, lidocaine-prilocaine, methocarbamol, morphine injection, senna-docusate, sodium chloride flush   Objective:  Vital Signs  Vitals:   10/31/20 1521 10/31/20 2100 11/01/20 0500 11/01/20 0754  BP: 125/83 105/67 107/82 111/86  Pulse: 88 98 98 100  Resp:    17  Temp: 98.2 F (36.8 C) 98.2 F (36.8 C) 97.7 F (36.5 C) 97.7 F (36.5 C)  TempSrc: Oral Oral Oral Axillary  SpO2: 94% 95% 97% 95%  Weight:   79.2 kg   Height:        Intake/Output Summary (Last 24 hours) at 11/01/2020 1132 Last data filed at 11/01/2020 1047 Gross per 24 hour  Intake 1010 ml  Output 610 ml  Net 400 ml   Filed Weights   10/31/20 0500 10/31/20 1147 11/01/20 0500  Weight: 80.4 kg 85.3 kg 79.2 kg     General appearance: Awake alert.  In no distress Resp: Normal effort.  Few crackles at the bases.  Mostly clear to auscultation. Cardio: S1-S2 is normal regular.  No S3-S4.  No rubs murmurs or bruit GI: Abdomen is soft.  Nontender nondistended.  Bowel sounds are present normal.  No masses organomegaly Extremities: Left leg covered in wrap Neurologic: Alert and oriented x3.  No new focal  neurological deficits.     Lab Results:  Data Reviewed: I have personally reviewed following labs and imaging studies  CBC: Recent Labs  Lab 10/30/20 1151 10/31/20 0731 11/01/20 0400  WBC 10.3 11.7* 12.4*  NEUTROABS  --  9.3* 10.9*  HGB 11.9* 10.8* 9.5*  HCT 39.5 35.9* 31.1*  MCV 98.5 99.7 97.8  PLT 198 170 500    Basic Metabolic Panel: Recent Labs  Lab 10/30/20 1151 10/31/20 0731 11/01/20 0400  NA 141 137 139  K 4.4 3.9 4.5  CL 109 108 105  CO2 22 19* 24  GLUCOSE 294* 241* 393*  BUN 19 16 22*  CREATININE 1.41* 1.15 1.50*  CALCIUM 8.9 8.5* 8.2*  MG  --  1.6* 1.6*  PHOS  --  3.4 4.7*    GFR: Estimated Creatinine Clearance: 58.9 mL/min (A) (by C-G formula based on SCr of 1.5 mg/dL (H)).  Liver Function Tests: Recent Labs  Lab 10/30/20 1151 10/31/20 0731 11/01/20 0400  AST 33 18 16  ALT 25 22 18   ALKPHOS 95 91 80  BILITOT 1.0 0.9 0.7  PROT 5.8* 5.4* 5.2*  ALBUMIN 2.8* 2.5* 2.2*     Coagulation Profile: Recent Labs  Lab 10/30/20 1151 10/31/20 0731 11/01/20 0400  INR 1.9* 1.4* 1.4*    CBG: Recent Labs  Lab 10/31/20 0600 10/31/20 1120 10/31/20 1635 10/31/20 2215 11/01/20 0623  GLUCAP 261* 155* 161* 301* 365*     Anemia Panel: Recent Labs    10/31/20 0731 11/01/20 0400  FERRITIN 158 173    Recent Results (from the past 240 hour(s))  Resp Panel by RT-PCR (Flu A&B, Covid) Nasopharyngeal Swab     Status: Abnormal   Collection Time: 10/30/20 11:56 AM   Specimen: Nasopharyngeal Swab; Nasopharyngeal(NP) swabs in vial transport medium  Result Value Ref Range Status   SARS Coronavirus 2 by RT PCR POSITIVE (A) NEGATIVE Final    Comment: RESULT CALLED TO, READ BACK BY AND VERIFIED WITH: [place] [phone]  10/30/20 A BROWNING (NOTE) SARS-CoV-2 target nucleic acids are DETECTED.  The SARS-CoV-2 RNA is generally detectable in upper respiratory specimens during the acute phase of infection. Positive results are indicative of the presence of  the identified virus, but do not rule out bacterial infection or co-infection with other pathogens not detected by the test. Clinical correlation with patient history and other diagnostic information is necessary to determine patient infection status. The expected result is Negative.  Fact Sheet for Patients: EntrepreneurPulse.com.au  Fact Sheet for Healthcare Providers: IncredibleEmployment.be  This test is not yet approved or cleared by the [place] FDA and  has been authorized for detection and/or diagnosis of SARS-CoV-2 by FDA under an Emergency Use Authorization (EUA).  This EUA will remain in effect (meaning this test can  be used) for the duration of  the COVID-19 declaration under Section 564(b)(1) of the Act, 21 U.S.C. section 360bbb-3(b)(1), unless the authorization is terminated or revoked sooner.     Influenza A by PCR NEGATIVE NEGATIVE Final   Influenza B by PCR NEGATIVE NEGATIVE Final    Comment: (NOTE) The Xpert Xpress SARS-CoV-2/FLU/RSV plus assay is intended as an aid in the diagnosis of influenza from Nasopharyngeal swab specimens and should not be used as a sole basis for treatment. Nasal washings and aspirates are unacceptable for Xpert Xpress SARS-CoV-2/FLU/RSV testing.  Fact Sheet for Patients: EntrepreneurPulse.com.au  Fact Sheet for Healthcare Providers: IncredibleEmployment.be  This test is not yet approved or cleared by the [place] FDA and has been authorized for detection and/or diagnosis of SARS-CoV-2 by FDA under an Emergency Use Authorization (EUA). This EUA will remain in effect (meaning this test can be used) for the duration of the COVID-19 declaration under Section 564(b)(1) of the Act, 21 U.S.C. section 360bbb-3(b)(1), unless the authorization is terminated or revoked.  Performed at [facility], [place] [address], [place], [place] [address]   Surgical  pcr screen     Status: None   Collection Time: 10/30/20  4:30 PM   Specimen: Nasal Mucosa; Nasal Swab  Result Value Ref Range Status   MRSA, PCR NEGATIVE NEGATIVE Final   Staphylococcus aureus NEGATIVE NEGATIVE Final    Comment: (NOTE) The Xpert SA Assay (FDA approved for NASAL specimens in patients 24 years of age and older), is one component of a comprehensive surveillance program. It is not intended to diagnose infection nor to guide or monitor treatment. Performed at [facility], [place] [address] [place], [place] [address]       Radiology Studies: DG Pelvis Portable  Result Date: 10/30/2020 CLINICAL DATA:  Fall EXAM: PORTABLE PELVIS 1-2 VIEWS COMPARISON:  CT PET of January 17, 2020 FINDINGS: Degenerative changes about the bilateral hips. Irregularity along the iliopectineal line on the LEFT.  Osteopenia. No displaced fractures. IMPRESSION: 1. Irregularity along the LEFT iliopectineal line, suspicious for nondisplaced fracture about the LEFT acetabulum. 2. Degenerative changes about the hips. Electronically Signed   By: Zetta Bills M.D.   On: 10/30/2020 13:03   DG CHEST PORT 1 VIEW  Result Date: 10/30/2020 CLINICAL DATA:  Fall. EXAM: PORTABLE CHEST 1 VIEW COMPARISON:  CT 09/17/2020.  Chest x-ray 10/07/2019. FINDINGS: Port-A-Cath noted with lead tip over the right ventricle. Cardiac pacer with lead tips over the right atrium right ventricle. Cardiomegaly. Diffuse bilateral interstitial prominence. Small left pleural effusion. Findings most consistent CHF. Pneumonitis cannot be excluded. IMPRESSION: 1.  Port-A-Cath noted with lead tip over the right ventricle. 2. Cardiac pacer with lead tips  over the right atrium and right ventricle. Cardiomegaly with diffuse bilateral interstitial prominence and small left pleural effusion. Electronically Signed   By: Marcello Moores  Register   On: 10/30/2020 15:28   DG Tibia/Fibula Left Port  Result Date: 10/30/2020 CLINICAL DATA:  Fall, deformity of the  knee. EXAM: PORTABLE LEFT TIBIA AND FIBULA - 2 VIEW COMPARISON:  Femoral and pelvic evaluation of the same date. FINDINGS: Overlapping stabilization device limiting assessment. No signs of fracture of the tibia or fibula. No joint effusion noted incidentally in the knee. Incidental note made of mid to distal femoral fracture at the upper margin of the submitted radiographs. Question of medial patellar subluxation versus is artifact from oblique nature of the submitted image. This could also be related to the severity of the fracture in the femur above. IMPRESSION: 1. No signs of fracture of the tibia or fibula. 2. Incidental note is made of mid to distal femoral fracture at the upper margin of the submitted radiographs. 3. Question medial patellar subluxation versus artifact from oblique nature of the submitted image. Electronically Signed   By: Zetta Bills M.D.   On: 10/30/2020 13:06   DG C-Arm 1-60 Min  Result Date: 10/31/2020 CLINICAL DATA:  Left femoral nail placement. EXAM: DG C-ARM 1-60 MIN; LEFT FEMUR 2 VIEWS FLUOROSCOPY TIME:  Fluoroscopy Time:  2 minutes and 52 seconds Reported radiation: 18 mGy. COMPARISON:  Radiographs from October 30, 2020 FINDINGS: Eight C-arm fluoroscopic images were obtained intraoperatively and submitted for post operative interpretation. These images demonstrate intramedullary nail and screw fixation of a spiral fracture of the distal femoral diaphysis. Improved alignment. No unexpected findings. Please see the performing provider's procedural report for further detail. IMPRESSION: Intraoperative fluoroscopic imaging, as detailed above. Electronically Signed   By: Margaretha Sheffield MD   On: 10/31/2020 14:39   DG FEMUR PORT 1V LEFT  Result Date: 10/30/2020 CLINICAL DATA:  Fall, deformity about the knee. EXAM: LEFT FEMUR PORTABLE 1 VIEW COMPARISON:  None FINDINGS: Distal femoral diaphyseal spiral fracture with butterfly fragment and a half shaft with medial displacement of  the distal fracture fragment, mild apex medial angulation and moderate wapex anterior angulation at the fracture site. Complete displacement and over riding noted on the lateral view, distal fracture fragment dorsally displaced relative to proximal femur. On lateral view dorsal displacement of fracture fragments are in close proximity to calcified femoral vasculature. Lateral view of the LEFT hip/proximal femur with limited evaluation due to overlap of stabilization device. IMPRESSION: Displaced and angulated spiral fracture of the distal femoral diaphysis as described. Limited evaluation of the LEFT hip/proximal femur due to overlap of stabilization device. Markedly displaced mid to distal femoral fracture is in close proximity to calcified femoral vasculature. Consider CT for vascular assessment as clinically warranted. Electronically Signed   By: Zetta Bills M.D.   On: 10/30/2020 12:56   DG FEMUR MIN 2 VIEWS LEFT  Result Date: 10/31/2020 CLINICAL DATA:  Left femoral nail placement. EXAM: DG C-ARM 1-60 MIN; LEFT FEMUR 2 VIEWS FLUOROSCOPY TIME:  Fluoroscopy Time:  2 minutes and 52 seconds Reported radiation: 18 mGy. COMPARISON:  Radiographs from October 30, 2020 FINDINGS: Eight C-arm fluoroscopic images were obtained intraoperatively and submitted for post operative interpretation. These images demonstrate intramedullary nail and screw fixation of a spiral fracture of the distal femoral diaphysis. Improved alignment. No unexpected findings. Please see the performing provider's procedural report for further detail. IMPRESSION: Intraoperative fluoroscopic imaging, as detailed above. Electronically Signed   By: Margaretha Sheffield  MD   On: 10/31/2020 14:39   DG FEMUR PORT MIN 2 VIEWS LEFT  Result Date: 10/31/2020 CLINICAL DATA:  Postop fixation of a mid distal femur fracture. EXAM: LEFT FEMUR PORTABLE 2 VIEWS COMPARISON:  Radiographs, same date. FINDINGS: There is retrograde intramedullary rod in the femur  with 2 proximal and 3 distal interlocking screws transfixing the complex comminuted femur fracture which is in good position and alignment. IMPRESSION: Internal fixation and near anatomic alignment of a complex comminuted femur fracture. Electronically Signed   By: Marijo Sanes M.D.   On: 10/31/2020 16:02       LOS: 2 days   [facility] Pager on [email or web address]  11/01/2020, 11:32 AM

## 2020-11-01 NOTE — Progress Notes (Signed)
Orthopaedic Trauma Progress Note  SUBJECTIVE: Reports mild pain about operative site, current pain regimen helping. No chest pain. No SOB. No nausea/vomiting. No other complaints. Has not been out of bed yet since surgery  OBJECTIVE:  Vitals:   11/01/20 0500 11/01/20 0754  BP: 107/82 111/86  Pulse: 98 100  Resp:  17  Temp: 97.7 F (36.5 C) 97.7 F (36.5 C)  SpO2: 97% 95%    General: Laying in bed, NAD Respiratory: No increased work of breathing.  LLE: Dressing CDI. No significant tenderness with palpation through leg. Hold knee in about 20 degrees of flexion, unable to full extend secondary to pain. Ankle dorsiflexion/plantarflexion intact. Endorses sensation to light touch distally. 2+ DP pulse    IMAGING: Stable post op imaging.   LABS:  Results for orders placed or performed during the hospital encounter of 10/30/20 (from the past 24 hour(s))  Glucose, capillary     Status: Abnormal   Collection Time: 10/31/20 11:20 AM  Result Value Ref Range   Glucose-Capillary 155 (H) 70 - 99 mg/dL  Glucose, capillary     Status: Abnormal   Collection Time: 10/31/20  4:35 PM  Result Value Ref Range   Glucose-Capillary 161 (H) 70 - 99 mg/dL  VITAMIN D 25 Hydroxy (Vit-D Deficiency, Fractures)     Status: Abnormal   Collection Time: 10/31/20  5:03 PM  Result Value Ref Range   Vit D, 25-Hydroxy 12.78 (L) 30 - 100 ng/mL  Glucose, capillary     Status: Abnormal   Collection Time: 10/31/20 10:15 PM  Result Value Ref Range   Glucose-Capillary 301 (H) 70 - 99 mg/dL  CBC with Differential/Platelet     Status: Abnormal   Collection Time: 11/01/20  4:00 AM  Result Value Ref Range   WBC 12.4 (H) 4.0 - 10.5 K/uL   RBC 3.18 (L) 4.22 - 5.81 MIL/uL   Hemoglobin 9.5 (L) 13.0 - 17.0 g/dL   HCT 31.1 (L) 39.0 - 52.0 %   MCV 97.8 80.0 - 100.0 fL   MCH 29.9 26.0 - 34.0 pg   MCHC 30.5 30.0 - 36.0 g/dL   RDW 14.3 11.5 - 15.5 %   Platelets 177 150 - 400 K/uL   nRBC 0.0 0.0 - 0.2 %   Neutrophils  Relative % 87 %   Neutro Abs 10.9 (H) 1.7 - 7.7 K/uL   Lymphocytes Relative 7 %   Lymphs Abs 0.9 0.7 - 4.0 K/uL   Monocytes Relative 5 %   Monocytes Absolute 0.6 0.1 - 1.0 K/uL   Eosinophils Relative 0 %   Eosinophils Absolute 0.0 0.0 - 0.5 K/uL   Basophils Relative 0 %   Basophils Absolute 0.0 0.0 - 0.1 K/uL   Immature Granulocytes 1 %   Abs Immature Granulocytes 0.06 0.00 - 0.07 K/uL  Comprehensive metabolic panel     Status: Abnormal   Collection Time: 11/01/20  4:00 AM  Result Value Ref Range   Sodium 139 135 - 145 mmol/L   Potassium 4.5 3.5 - 5.1 mmol/L   Chloride 105 98 - 111 mmol/L   CO2 24 22 - 32 mmol/L   Glucose, Bld 393 (H) 70 - 99 mg/dL   BUN 22 (H) 6 - 20 mg/dL   Creatinine, Ser 1.50 (H) 0.61 - 1.24 mg/dL   Calcium 8.2 (L) 8.9 - 10.3 mg/dL   Total Protein 5.2 (L) 6.5 - 8.1 g/dL   Albumin 2.2 (L) 3.5 - 5.0 g/dL   AST 16 15 -  41 U/L   ALT 18 0 - 44 U/L   Alkaline Phosphatase 80 38 - 126 U/L   Total Bilirubin 0.7 0.3 - 1.2 mg/dL   GFR, Estimated 54 (L) >60 mL/min   Anion gap 10 5 - 15  C-reactive protein     Status: Abnormal   Collection Time: 11/01/20  4:00 AM  Result Value Ref Range   CRP 6.5 (H) <1.0 mg/dL  D-dimer, quantitative (not at [facility])     Status: Abnormal   Collection Time: 11/01/20  4:00 AM  Result Value Ref Range   D-Dimer, Quant 3.18 (H) 0.00 - 0.50 ug/mL-FEU  Ferritin     Status: None   Collection Time: 11/01/20  4:00 AM  Result Value Ref Range   Ferritin 173 24 - 336 ng/mL  Magnesium     Status: Abnormal   Collection Time: 11/01/20  4:00 AM  Result Value Ref Range   Magnesium 1.6 (L) 1.7 - 2.4 mg/dL  Phosphorus     Status: Abnormal   Collection Time: 11/01/20  4:00 AM  Result Value Ref Range   Phosphorus 4.7 (H) 2.5 - 4.6 mg/dL  Protime-INR     Status: Abnormal   Collection Time: 11/01/20  4:00 AM  Result Value Ref Range   Prothrombin Time 16.6 (H) 11.4 - 15.2 seconds   INR 1.4 (H) 0.8 - 1.2  Glucose, capillary     Status: Abnormal    Collection Time: 11/01/20  6:23 AM  Result Value Ref Range   Glucose-Capillary 365 (H) 70 - 99 mg/dL    ASSESSMENT: Dennis [name]. is a 60 y.o. male, 1 Day Post-Op s/p RETROGRADE IMN LEFT FEMUR  CV/Blood loss: Acute blood loss anemia, Hgb 9.5 this morning. Hemodynamically stable  PLAN: Weightbearing: WBAT LLE Incisional and dressing care: Plan to remove tomorrow and leave incisions open to air.   Showering: Ok to begin showering with assistance 11/03/20 Orthopedic device(s): None  Pain management:  1. Tylenol 325-650 mg q 6 hours PRN 2. Robaxin 500 mg q 6 hours PRN 3. Norco 5-325 mg q 6 hours PRN moderate pain/ Norco 7.5-325 mg q 6 hours PRN severe pain 4. Morphine 0.5-1 mg q 2 hours PRN VTE prophylaxis: Ok to restart Coumadin today from ortho standpoint, SCDs ID:  Ancef 2gm post op Foley/Lines:  No foley, KVO IVFs Impediments to Fracture Healing: Vit D level 12, start on D3 supplementation Dispo: PT/OT eval today, dispo pending. Plan to change dressing tomorrow.  Follow - up plan: 2 weeks  Contact information:  Katha Hamming MD, Patrecia Pace PA-C. After hours and holidays please check [email or web address] for group call information for [facility]   Kaelem Brach A. Ricci Barker, PA-C [phone] (office) [email or web address]

## 2020-11-01 NOTE — Plan of Care (Addendum)
  Problem: Health Behavior/Discharge Planning: Goal: Ability to manage health-related needs will improve Outcome: Progressing   Problem: Clinical Measurements: Goal: Ability to maintain clinical measurements within normal limits will improve Outcome: Progressing   Problem: Activity: Goal: Risk for activity intolerance will decrease Outcome: Progressing   Problem: Coping: Goal: Level of anxiety will decrease Outcome: Progressing   Problem: Elimination: Goal: Will not experience complications related to bowel motility Outcome: Progressing   Problem: Pain Managment: Goal: General experience of comfort will improve Outcome: Progressing   Problem: Safety: Goal: Ability to remain free from injury will improve Outcome: Progressing   Problem: Skin Integrity: Goal: Risk for impaired skin integrity will decrease Outcome: Progressing   

## 2020-11-02 ENCOUNTER — Encounter (HOSPITAL_COMMUNITY): Payer: Self-pay | Admitting: Student

## 2020-11-02 DIAGNOSIS — I5022 Chronic systolic (congestive) heart failure: Secondary | ICD-10-CM | POA: Diagnosis not present

## 2020-11-02 DIAGNOSIS — E1165 Type 2 diabetes mellitus with hyperglycemia: Secondary | ICD-10-CM | POA: Diagnosis not present

## 2020-11-02 DIAGNOSIS — U071 COVID-19: Secondary | ICD-10-CM | POA: Diagnosis not present

## 2020-11-02 DIAGNOSIS — F1410 Cocaine abuse, uncomplicated: Secondary | ICD-10-CM | POA: Diagnosis not present

## 2020-11-02 LAB — CBC WITH DIFFERENTIAL/PLATELET
Abs Immature Granulocytes: 0.12 10*3/uL — ABNORMAL HIGH (ref 0.00–0.07)
Basophils Absolute: 0 10*3/uL (ref 0.0–0.1)
Basophils Relative: 0 %
Eosinophils Absolute: 0.1 10*3/uL (ref 0.0–0.5)
Eosinophils Relative: 0 %
HCT: 30.1 % — ABNORMAL LOW (ref 39.0–52.0)
Hemoglobin: 9.1 g/dL — ABNORMAL LOW (ref 13.0–17.0)
Immature Granulocytes: 1 %
Lymphocytes Relative: 6 %
Lymphs Abs: 1 10*3/uL (ref 0.7–4.0)
MCH: 29.4 pg (ref 26.0–34.0)
MCHC: 30.2 g/dL (ref 30.0–36.0)
MCV: 97.4 fL (ref 80.0–100.0)
Monocytes Absolute: 1 10*3/uL (ref 0.1–1.0)
Monocytes Relative: 7 %
Neutro Abs: 13.3 10*3/uL — ABNORMAL HIGH (ref 1.7–7.7)
Neutrophils Relative %: 86 %
Platelets: 179 10*3/uL (ref 150–400)
RBC: 3.09 MIL/uL — ABNORMAL LOW (ref 4.22–5.81)
RDW: 14.5 % (ref 11.5–15.5)
WBC: 15.5 10*3/uL — ABNORMAL HIGH (ref 4.0–10.5)
nRBC: 0.1 % (ref 0.0–0.2)

## 2020-11-02 LAB — GLUCOSE, CAPILLARY
Glucose-Capillary: 271 mg/dL — ABNORMAL HIGH (ref 70–99)
Glucose-Capillary: 268 mg/dL — ABNORMAL HIGH (ref 70–99)
Glucose-Capillary: 185 mg/dL — ABNORMAL HIGH (ref 70–99)
Glucose-Capillary: 118 mg/dL — ABNORMAL HIGH (ref 70–99)

## 2020-11-02 LAB — COMPREHENSIVE METABOLIC PANEL
ALT: 12 U/L (ref 0–44)
AST: 18 U/L (ref 15–41)
Albumin: 2.3 g/dL — ABNORMAL LOW (ref 3.5–5.0)
Alkaline Phosphatase: 79 U/L (ref 38–126)
Anion gap: 8 (ref 5–15)
BUN: 29 mg/dL — ABNORMAL HIGH (ref 6–20)
CO2: 25 mmol/L (ref 22–32)
Calcium: 8.5 mg/dL — ABNORMAL LOW (ref 8.9–10.3)
Chloride: 103 mmol/L (ref 98–111)
Creatinine, Ser: 1.31 mg/dL — ABNORMAL HIGH (ref 0.61–1.24)
GFR, Estimated: >60 mL/min (ref >60)
Glucose, Bld: 269 mg/dL — ABNORMAL HIGH (ref 70–99)
Potassium: 4.4 mmol/L (ref 3.5–5.1)
Sodium: 136 mmol/L (ref 135–145)
Total Bilirubin: 0.5 mg/dL (ref 0.3–1.2)
Total Protein: 5.2 g/dL — ABNORMAL LOW (ref 6.5–8.1)

## 2020-11-02 LAB — PROTIME-INR
INR: 1.4 — ABNORMAL HIGH (ref 0.8–1.2)
Prothrombin Time: 16.4 seconds — ABNORMAL HIGH (ref 11.4–15.2)

## 2020-11-02 LAB — D-DIMER, QUANTITATIVE: D-Dimer, Quant: 1.42 ug/mL-FEU — ABNORMAL HIGH (ref 0.00–0.50)

## 2020-11-02 LAB — MAGNESIUM: Magnesium: 2 mg/dL (ref 1.7–2.4)

## 2020-11-02 LAB — PHOSPHORUS: Phosphorus: 2.7 mg/dL (ref 2.5–4.6)

## 2020-11-02 LAB — C-REACTIVE PROTEIN: CRP: 4.9 mg/dL — ABNORMAL HIGH (ref <1.0)

## 2020-11-02 MED ORDER — HYDROCODONE-ACETAMINOPHEN 5-325 MG PO TABS: 1.0000 | ORAL_TABLET | Freq: Four times a day (QID) | ORAL | 0 refills | Status: DC | PRN

## 2020-11-02 MED ORDER — VITAMIN D3 25 MCG PO TABS: 2000.0000 [IU] | ORAL_TABLET | Freq: Two times a day (BID) | ORAL | 0 refills | Status: DC

## 2020-11-02 MED ORDER — INSULIN GLARGINE 100 UNIT/ML ~~LOC~~ SOLN
20.0000 [IU] | Freq: Two times a day (BID) | SUBCUTANEOUS | Status: DC
  Administered 2020-11-02 – 2020-11-10 (×16): 20 [IU] via SUBCUTANEOUS
  Filled 2020-11-02 (×17): qty 0.2

## 2020-11-02 MED ORDER — WARFARIN SODIUM 6 MG PO TABS
6.0000 mg | ORAL_TABLET | Freq: Once | ORAL | Status: AC
  Administered 2020-11-02: 6 mg via ORAL
  Filled 2020-11-02: qty 1

## 2020-11-02 NOTE — Progress Notes (Signed)
[facility] PROGRESS NOTE   Dennis [name]. NWG:[id number] DOB: 06-06-1961 DOA: 10/30/2020  PCP: [name], MD  Brief History/Interval Summary: 60 y.o. 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  He landed on his butt, and immediately saw the deformity to his left leg.  Patient admits that he has had a intermittently productive cough over the last few days.  He had been around his brother and sister-in-law who tested positive for COVID.  Patient reports that he just received his booster shot approximately 2 months ago.  Patient was found to have left distal femur fracture.  He was hospitalized for further management.    Reason for Visit: Left distal femur fracture.  COVID-19 infection  Consultants: Orthopedics.  Cardiology.  Procedures: 1/19: Retrograde intramedullary nailing of left femur fracture   Antibiotics: Anti-infectives (From admission, onward)   Start     Dose/Rate Route Frequency Ordered Stop   11/01/20 1000  remdesivir 100 mg in sodium chloride 0.9 % 100 mL IVPB       "Followed by" Linked Group Details   100 mg 200 mL/hr over 30 Minutes Intravenous Daily 10/31/20 1229 11/02/20 0953   10/31/20 2100  ceFAZolin (ANCEF) IVPB 2g/100 mL premix        2 g 200 mL/hr over 30 Minutes Intravenous Every 8 hours 10/31/20 1512 11/01/20 1716   10/31/20 1600  remdesivir 200 mg in sodium chloride 0.9% 250 mL IVPB       "Followed by" Linked Group Details   200 mg 580 mL/hr over 30 Minutes Intravenous Once 10/31/20 1229 10/31/20 2257   10/31/20 1406  vancomycin (VANCOCIN) powder  Status:  Discontinued          As needed 10/31/20 1411 10/31/20 1452   10/31/20 0600  ceFAZolin (ANCEF) IVPB 2g/100 mL premix        2 g 200 mL/hr over 30 Minutes  Intravenous To Short Stay 10/30/20 1848 10/31/20 1255      Subjective/Interval History: Patient mentions that he is feeling better.  Pain in the left leg is getting better.  Denies any shortness of breath or cough.      Assessment/Plan:  Distal left femur fracture Patient underwent intramedullary nail placement on 1/19.  PT and OT to evaluate.  Pain control.  Further management per orthopedics.    COVID-19 infection/acute respiratory failure with hypoxia He had exposure this past weekend from his brother and sister-in-law who tested positive for COVID-19.  Patient has minimal cough.  Chest x-ray raise concern for interstitial prominence concerning for possible pneumonitis or CHF.  BNP was noted to be elevated at 795.  Patient positive for COVID-19.   Patient was given 1 dose of IV furosemide.   Patient seems to be stable from a respiratory standpoint.  Continue systemic steroids for now.  He was also given a 3-day course of remdesivir.  CRP noted to be better.  D-dimer is also improving.  He is saturating normal on room air.  Continue incentive spirometry and mobilization.    [place]    10/30/20 1733 10/30/20 2000 10/31/20 0731 11/01/20 0400 11/02/20 0336  DDIMER 5.40*  --  2.02* 3.18* 1.42*  FERRITIN 146  --  158 173  --   LDH  --  295*  --   --   --   CRP 0.6  --  2.7* 6.5* 4.9*    Chronic systolic CHF/AICD in situ/chronic anticoagulation EF noted to be 25 to 30% based on echocardiogram done in September 2021.  Strict ins and outs and daily weights.   Patient was seen by cardiology who have signed off.  No further doses of Lasix at this time.    Patient on warfarin at home.  Was placed on hold due to surgery.  Warfarin has been resumed.  History of small cell lung cancer with a metastatic brain lesion Patient followed by Dr. Mickeal Skinner and Dr. Julien Nordmann.  Patient underwent whole brain radiation and chemotherapy.  Supposed to have repeat MRI brain on January 30.   Noted to be on dexamethasone which is being continued at a higher dose for now.  We will slowly taper it back to his usual dose  Anemia of chronic disease/acute blood loss Drop in hemoglobin could be due to operative losses.  Noted to be stable this morning.  No overt bleeding noted.    Orthostatic hypotension Stable.  Continue with midodrine and salt tablets.  Acute kidney injury Creatinine was noted to be elevated yesterday likely due to furosemide.  Avoid nephrotoxic agents.  He was not given any further doses of diuretics.  Renal function better today.  Monitor urine output.    Dysphagia Recent speech evaluation revealed mild oropharyngeal dysphagia likely due to his prior stroke as well as brain lesion.  Aspiration precautions.  Diabetes mellitus type 2 uncontrolled with hyperglycemia HbA1c 8.7 in December.  CBGs poorly controlled likely due to higher dose of steroids.  Dose of Lantus was increased yesterday.  Continue to monitor.  Continue with SSI   History of stroke Patient with right MCA stroke in September 2021.  Has residual dysarthria and left-sided weakness.  PT and OT evaluation.  History of cocaine abuse Urine drug screen still positive for cocaine.    Hyperlipidemia Statin.   DVT Prophylaxis: Warfarin Code Status: Full code Family Communication: Discussed with the patient.  No family at bedside Disposition Plan: SNF recommended by PT and OT  Status is: Inpatient  Remains inpatient appropriate because:Ongoing active pain requiring inpatient pain management, IV treatments appropriate due to intensity of illness or inability to take PO and Inpatient level of care appropriate due to severity of illness   Dispo: The patient is from: Home              Anticipated d/c is to: SNF              Anticipated d/c date is: 3 days              Patient currently is not medically stable to d/c.        Medications:  Scheduled: . vitamin C  500 mg Oral Daily  .  atorvastatin  20 mg Oral Daily  . Chlorhexidine Gluconate Cloth  6 each Topical Daily  . cholecalciferol  2,000 Units Oral BID  . dexamethasone (DECADRON) injection  4 mg Intravenous Q12H  . feeding supplement (GLUCERNA SHAKE)  237 mL Oral BID BM  . insulin aspart  0-20 Units Subcutaneous TID WC  . insulin aspart  0-5 Units Subcutaneous QHS  . insulin aspart  6 Units Subcutaneous TID WC  . insulin glargine  15 Units Subcutaneous BID  . midodrine  10 mg Oral TID WC  . multivitamin with minerals  1 tablet Oral Daily  .  senna  1 tablet Oral BID  . sodium chloride flush  10-40 mL Intracatheter Q12H  . sodium chloride  1 g Oral q AM  . tamsulosin  0.4 mg Oral Daily  . warfarin  6 mg Oral ONCE-1600  . Warfarin - Pharmacist Dosing Inpatient   Does not apply q1600  . zinc sulfate  220 mg Oral Daily   Continuous:  TFT:DDUKGURKYHCWC, albuterol, chlorpheniramine-HYDROcodone, guaiFENesin-dextromethorphan, HYDROcodone-acetaminophen, HYDROcodone-acetaminophen, lidocaine-prilocaine, methocarbamol, morphine injection, senna-docusate, sodium chloride flush   Objective:  Vital Signs  Vitals:   11/01/20 1417 11/01/20 1900 11/02/20 0500 11/02/20 0834  BP: 124/88 118/85 122/78 105/77  Pulse: (!) 103 (!) 105 90 98  Resp: 18   16  Temp: 97.9 F (36.6 C) 98.4 F (36.9 C) 98.7 F (37.1 C) 98.1 F (36.7 C)  TempSrc: Oral Oral Oral Oral  SpO2: 97% 95% 98% 92%  Weight:   87 kg   Height:        Intake/Output Summary (Last 24 hours) at 11/02/2020 1232 Last data filed at 11/02/2020 1213 Gross per 24 hour  Intake 699.98 ml  Output 900 ml  Net -200.02 ml   Filed Weights   10/31/20 1147 11/01/20 0500 11/02/20 0500  Weight: 85.3 kg 79.2 kg 87 kg    General appearance: Awake alert.  In no distress Resp: Clear to auscultation bilaterally.  Normal effort Cardio: S1-S2 is normal regular.  No S3-S4.  No rubs murmurs or bruit GI: Abdomen is soft.  Nontender nondistended.  Bowel sounds are present  normal.  No masses organomegaly     Lab Results:  Data Reviewed: I have personally reviewed following labs and imaging studies  CBC: Recent Labs  Lab 10/30/20 1151 10/31/20 0731 11/01/20 0400 11/02/20 0336  WBC 10.3 11.7* 12.4* 15.5*  NEUTROABS  --  9.3* 10.9* 13.3*  HGB 11.9* 10.8* 9.5* 9.1*  HCT 39.5 35.9* 31.1* 30.1*  MCV 98.5 99.7 97.8 97.4  PLT 198 170 177 376    Basic Metabolic Panel: Recent Labs  Lab 10/30/20 1151 10/31/20 0731 11/01/20 0400 11/02/20 0336  NA 141 137 139 136  K 4.4 3.9 4.5 4.4  CL 109 108 105 103  CO2 22 19* 24 25  GLUCOSE 294* 241* 393* 269*  BUN 19 16 22* 29*  CREATININE 1.41* 1.15 1.50* 1.31*  CALCIUM 8.9 8.5* 8.2* 8.5*  MG  --  1.6* 1.6* 2.0  PHOS  --  3.4 4.7* 2.7    GFR: Estimated Creatinine Clearance: 67.5 mL/min (A) (by C-G formula based on SCr of 1.31 mg/dL (H)).  Liver Function Tests: Recent Labs  Lab 10/30/20 1151 10/31/20 0731 11/01/20 0400 11/02/20 0336  AST 33 18 16 18   ALT 25 22 18 12   ALKPHOS 95 91 80 79  BILITOT 1.0 0.9 0.7 0.5  PROT 5.8* 5.4* 5.2* 5.2*  ALBUMIN 2.8* 2.5* 2.2* 2.3*     Coagulation Profile: Recent Labs  Lab 10/30/20 1151 10/31/20 0731 11/01/20 0400 11/02/20 0336  INR 1.9* 1.4* 1.4* 1.4*    CBG: Recent Labs  Lab 11/01/20 1229 11/01/20 1657 11/01/20 2036 11/02/20 0821 11/02/20 1127  GLUCAP 227* 187* 221* 271* 268*     Anemia Panel: Recent Labs    10/31/20 0731 11/01/20 0400  FERRITIN 158 173    Recent Results (from the past 240 hour(s))  Resp Panel by RT-PCR (Flu A&B, Covid) Nasopharyngeal Swab     Status: Abnormal   Collection Time: 10/30/20 11:56 AM   Specimen: Nasopharyngeal Swab; Nasopharyngeal(NP) swabs in  vial transport medium  Result Value Ref Range Status   SARS Coronavirus 2 by RT PCR POSITIVE (A) NEGATIVE Final    Comment: RESULT CALLED TO, READ BACK BY AND VERIFIED WITHAlfonso Patten [facility] RN 1429 10/30/20 A BROWNING (NOTE) SARS-CoV-2 target nucleic acids are  DETECTED.  The SARS-CoV-2 RNA is generally detectable in upper respiratory specimens during the acute phase of infection. Positive results are indicative of the presence of the identified virus, but do not rule out bacterial infection or co-infection with other pathogens not detected by the test. Clinical correlation with patient history and other diagnostic information is necessary to determine patient infection status. The expected result is Negative.  Fact Sheet for Patients: EntrepreneurPulse.com.au  Fact Sheet for Healthcare Providers: IncredibleEmployment.be  This test is not yet approved or cleared by the [place] FDA and  has been authorized for detection and/or diagnosis of SARS-CoV-2 by FDA under an Emergency Use Authorization (EUA).  This EUA will remain in effect (meaning this test can  be used) for the duration of  the COVID-19 declaration under Section 564(b)(1) of the Act, 21 U.S.C. section 360bbb-3(b)(1), unless the authorization is terminated or revoked sooner.     Influenza A by PCR NEGATIVE NEGATIVE Final   Influenza B by PCR NEGATIVE NEGATIVE Final    Comment: (NOTE) The Xpert Xpress SARS-CoV-2/FLU/RSV plus assay is intended as an aid in the diagnosis of influenza from Nasopharyngeal swab specimens and should not be used as a sole basis for treatment. Nasal washings and aspirates are unacceptable for Xpert Xpress SARS-CoV-2/FLU/RSV testing.  Fact Sheet for Patients: EntrepreneurPulse.com.au  Fact Sheet for Healthcare Providers: IncredibleEmployment.be  This test is not yet approved or cleared by the [place] FDA and has been authorized for detection and/or diagnosis of SARS-CoV-2 by FDA under an Emergency Use Authorization (EUA). This EUA will remain in effect (meaning this test can be used) for the duration of the COVID-19 declaration under Section 564(b)(1) of the Act, 21  U.S.C. section 360bbb-3(b)(1), unless the authorization is terminated or revoked.  Performed at [facility], [place] [address], [place], [place] [address]   Surgical pcr screen     Status: None   Collection Time: 10/30/20  4:30 PM   Specimen: Nasal Mucosa; Nasal Swab  Result Value Ref Range Status   MRSA, PCR NEGATIVE NEGATIVE Final   Staphylococcus aureus NEGATIVE NEGATIVE Final    Comment: (NOTE) The Xpert SA Assay (FDA approved for NASAL specimens in patients 9 years of age and older), is one component of a comprehensive surveillance program. It is not intended to diagnose infection nor to guide or monitor treatment. Performed at [facility], [place] [address], [place], [place] [address]       Radiology Studies: DG C-Arm 1-60 Min  Result Date: 10/31/2020 CLINICAL DATA:  Left femoral nail placement. EXAM: DG C-ARM 1-60 MIN; LEFT FEMUR 2 VIEWS FLUOROSCOPY TIME:  Fluoroscopy Time:  2 minutes and 52 seconds Reported radiation: 18 mGy. COMPARISON:  Radiographs from October 30, 2020 FINDINGS: Eight C-arm fluoroscopic images were obtained intraoperatively and submitted for post operative interpretation. These images demonstrate intramedullary nail and screw fixation of a spiral fracture of the distal femoral diaphysis. Improved alignment. No unexpected findings. Please see the performing provider's procedural report for further detail. IMPRESSION: Intraoperative fluoroscopic imaging, as detailed above. Electronically Signed   By: Margaretha Sheffield MD   On: 10/31/2020 14:39   DG FEMUR MIN 2 VIEWS LEFT  Result Date: 10/31/2020 CLINICAL  DATA:  Left femoral nail placement. EXAM: DG C-ARM 1-60 MIN; LEFT FEMUR 2 VIEWS FLUOROSCOPY TIME:  Fluoroscopy Time:  2 minutes and 52 seconds Reported radiation: 18 mGy. COMPARISON:  Radiographs from October 30, 2020 FINDINGS: Eight C-arm fluoroscopic images were obtained intraoperatively and submitted for post operative interpretation. These images  demonstrate intramedullary nail and screw fixation of a spiral fracture of the distal femoral diaphysis. Improved alignment. No unexpected findings. Please see the performing provider's procedural report for further detail. IMPRESSION: Intraoperative fluoroscopic imaging, as detailed above. Electronically Signed   By: Margaretha Sheffield MD   On: 10/31/2020 14:39   DG FEMUR PORT MIN 2 VIEWS LEFT  Result Date: 10/31/2020 CLINICAL DATA:  Postop fixation of a mid distal femur fracture. EXAM: LEFT FEMUR PORTABLE 2 VIEWS COMPARISON:  Radiographs, same date. FINDINGS: There is retrograde intramedullary rod in the femur with 2 proximal and 3 distal interlocking screws transfixing the complex comminuted femur fracture which is in good position and alignment. IMPRESSION: Internal fixation and near anatomic alignment of a complex comminuted femur fracture. Electronically Signed   By: Marijo Sanes M.D.   On: 10/31/2020 16:02       LOS: 3 days   [facility] Pager on [email or web address]  11/02/2020, 12:32 PM

## 2020-11-02 NOTE — Plan of Care (Signed)
?  Problem: Clinical Measurements: ?Goal: Ability to maintain clinical measurements within normal limits will improve ?Outcome: Progressing ?Goal: Will remain free from infection ?Outcome: Progressing ?Goal: Diagnostic test results will improve ?Outcome: Progressing ?  ?

## 2020-11-02 NOTE — Progress Notes (Signed)
Physical Therapy Treatment Patient Details Name: Dennis [name]. MRN: [id number] DOB: 1961/07/12 Today's Date: 11/02/2020    History of Present Illness Patient is a 60 y/o male with PMH significant for IDDM, HTN, HLD, CVA in 2016, lung cancer, and CAD on warfarin. Patient had mechanical fall when he slipped on ice resulting in L femur fx. Patient s/p IM nail L femur on 1/19. Pt also tested covid+ with cough.    PT Comments    Pt up in chair on arrival, agreeable to therapy session and with good participation. Pt remains limited due to LLE pain with weight bearing, able to perform short gait task in room from chair>bed but still having difficulty advancing RLE due to LLE pain when stepping. Pt continues to need +2 modA for safety with transfers and gait, and modA +1 for bed mobility. Reviewed supine/seated BLE therapeutic exercises for strengthening, pt performed with AA on LLE due to hx stroke/pain and pt given HEP handout. Pt continues to benefit from PT services to progress toward functional mobility goals. Pt will continue to benefit from skilled rehab in a post acute setting to maximize functional gains before returning home.  Follow Up Recommendations  SNF;Supervision/Assistance - 24 hour     Equipment Recommendations  Rolling walker with 5" wheels    Recommendations for Other Services       Precautions / Restrictions Precautions Precautions: Fall Restrictions Weight Bearing Restrictions: Yes LLE Weight Bearing: Weight bearing as tolerated    Mobility  Bed Mobility Overal bed mobility: Needs Assistance Bed Mobility: Sit to Supine       Sit to supine: Mod assist   General bed mobility comments: modA for LLE assist, pt using bed rails to lower trunk  Transfers Overall transfer level: Needs assistance Equipment used: Rolling walker (2 wheeled) Transfers: Sit to/from Stand Sit to Stand: Mod assist;+2 physical assistance;+2 safety/equipment         General transfer  comment: modA+2 for boost up, cues for hand placement for pushing up from chair  Ambulation/Gait Ambulation/Gait assistance: +2 safety/equipment;Mod assist Gait Distance (Feet): 8 Feet Assistive device: Rolling walker (2 wheeled) Gait Pattern/deviations: Step-to pattern;Decreased stride length;Antalgic;Shuffle;Decreased stance time - right;Decreased step length - right;Decreased weight shift to left Gait velocity: decreased; <0.2 m/s   General Gait Details: cues and assist for RW management and directions, difficulty accepting weight onto LLE to offload RLE for steps; tends to shuffle/pivot on RLE   Stairs             Wheelchair Mobility    Modified Rankin (Stroke Patients Only)       Balance Overall balance assessment: Needs assistance Sitting-balance support: Feet supported;No upper extremity supported Sitting balance-Leahy Scale: Fair     Standing balance support: Bilateral upper extremity supported;During functional activity Standing balance-Leahy Scale: Poor Standing balance comment: reliant on UE support and external assist                            Cognition Arousal/Alertness: Awake/alert Behavior During Therapy: WFL for tasks assessed/performed Overall Cognitive Status: Within Functional Limits for tasks assessed                                 General Comments: pleasantly cooperative      Exercises Other Exercises Other Exercises: seated BLE A/AAROM (AA on LLE AROM on RLE): LAQ, hip flexion, ankle pumps 1x10 reps ea;  supine BLE AROM: quad sets, glute sets, ankle pumps 1x5 reps ea    General Comments General comments (skin integrity, edema, etc.): pt given HEP (see General Strengthening handout in Education)      Pertinent Vitals/Pain Pain Assessment: Faces Faces Pain Scale: Hurts even more Pain Location: L LE with weight bearing Pain Descriptors / Indicators: Grimacing;Guarding;Moaning Pain Intervention(s): Monitored  during session;Repositioned;Ice applied (ice to L thigh at end of session)  SpO2 99% on RA after stand pivot transfer, HR WNL.  Home Living                      Prior Function            PT Goals (current goals can now be found in the care plan section) Acute Rehab PT Goals Patient Stated Goal: to go home PT Goal Formulation: With patient Time For Goal Achievement: 11/15/20 Potential to Achieve Goals: Fair Progress towards PT goals: Progressing toward goals    Frequency    Min 3X/week      PT Plan Current plan remains appropriate    Co-evaluation              AM-PAC PT "6 Clicks" Mobility   Outcome Measure  Help needed turning from your back to your side while in a flat bed without using bedrails?: A Lot Help needed moving from lying on your back to sitting on the side of a flat bed without using bedrails?: A Lot Help needed moving to and from a bed to a chair (including a wheelchair)?: A Lot Help needed standing up from a chair using your arms (e.g., wheelchair or bedside chair)?: A Lot Help needed to walk in hospital room?: A Lot Help needed climbing 3-5 steps with a railing? : Total 6 Click Score: 11    End of Session Equipment Utilized During Treatment: Gait belt Activity Tolerance: Patient tolerated treatment well Patient left: with call bell/phone within reach;in bed;with bed alarm set;with SCD's reapplied;Other (comment) (heels floated) Nurse Communication: Mobility status PT Visit Diagnosis: Unsteadiness on feet (R26.81);Other abnormalities of gait and mobility (R26.89);Muscle weakness (generalized) (M62.81);History of falling (Z91.81)     Time: 0938-1829 PT Time Calculation (min) (ACUTE ONLY): 25 min  Charges:  $Therapeutic Exercise: 8-22 mins $Therapeutic Activity: 8-22 mins                     Aleana Fifita P., PTA Acute Rehabilitation Services Pager: [phone] Office: [place] 11/02/2020, 3:59 PM

## 2020-11-02 NOTE — Progress Notes (Signed)
ANTICOAGULATION CONSULT NOTE - Follow Up Consult  Pharmacy Consult for Warfarin Indication: CVA  No Known Allergies  Patient Measurements: Height: 6' (182.9 cm) Weight: 87 kg (191 lb 12.8 oz) IBW/kg (Calculated) : 77.6 Heparin Dosing Weight: 85.3 kg  Vital Signs: Temp: 98.1 F (36.7 C) (01/21 0834) Temp Source: Oral (01/21 0834) BP: 105/77 (01/21 0834) Pulse Rate: 98 (01/21 0834)  Labs: Recent Labs    10/31/20 0731 11/01/20 0400 11/02/20 0336  HGB 10.8* 9.5* 9.1*  HCT 35.9* 31.1* 30.1*  PLT 170 177 179  LABPROT 16.8* 16.6* 16.4*  INR 1.4* 1.4* 1.4*  CREATININE 1.15 1.50* 1.31*    Estimated Creatinine Clearance: 67.5 mL/min (A) (by C-G formula based on SCr of 1.31 mg/dL (H)).   Assessment: 77 YOM on warfarin PTA for hx CVA. Admitted with INR 2.5 on PTA dose of 3.75 mg daily EXCEPT for 7.5 mg on Tues (+/- Thurs). Held and reversed for repair of femur fracture with ortho. Bridged with Heparin pre-op. Post-op to resume Warfarin dosing without a bridge.  INR today is 1.4 and subtherapeutic. No bleeding noted, Hgb stable post-op, platelets are normal.  Goal of Therapy:  INR 2-3 Monitor platelets by anticoagulation protocol: Yes   Plan:  - Warfarin 6 mg x 1 dose at 1800 today - Daily PT/INR, CBC q72h - Will continue to monitor for any signs/symptoms of bleeding and will follow up with PT/INR in the a.m.    Thank you for involving pharmacy in this patient's care.  Renold Genta, PharmD, BCPS Clinical Pharmacist Clinical phone for 11/02/2020 until 3p is [phone] 11/02/2020 11:08 AM  **Pharmacist phone directory can be found on [email or web address] listed under [place]**

## 2020-11-02 NOTE — TOC Initial Note (Signed)
Transition of Care (TOC) - Initial/Assessment Note    Patient Details  Name: Dennis [name]. MRN: [id number] Date of Birth: 1961/03/01  Transition of Care [facility]) CM/SW Contact:    Amador Cunas, [place] Phone Number: 11/02/2020, 2:58 PM  Clinical Narrative: SW spoke with pt re PT/OT recommendation for SNF. Pt from home with his brother, previously active with HH. No prior SNF stay. SW explained SNF placement process and answered questions. Pt agreeable to SNF for STR. Will f/u with offers once available.   Wandra Feinstein, MSW, LCSW [phone] (coverage)                     Expected Discharge Plan: [place] Barriers to Discharge: Continued Medical Work up,No SNF bed   Patient Goals and CMS Choice        Expected Discharge Plan and Services Expected Discharge Plan: [place] Choice: [place] arrangements for the past 2 months: Single Family Home                                      Prior Living Arrangements/Services Living arrangements for the past 2 months: Single Family Home Lives with:: Siblings Patient language and need for interpreter reviewed:: No        Need for Family Participation in Patient Care: Yes (Comment) Care giver support system in place?: Yes (comment)   Criminal Activity/Legal Involvement Pertinent to Current Situation/Hospitalization: No - Comment as needed  Activities of Daily Living Home Assistive Devices/Equipment: CBG Meter ADL Screening (condition at time of admission) Patient's cognitive ability adequate to safely complete daily activities?: Yes Is the patient deaf or have difficulty hearing?: No Does the patient have difficulty seeing, even when wearing glasses/contacts?: No Does the patient have difficulty concentrating, remembering, or making decisions?: No Patient able to express need for assistance with ADLs?: Yes Does the patient have difficulty  dressing or bathing?: No Independently performs ADLs?: Yes (appropriate for developmental age) Does the patient have difficulty walking or climbing stairs?: No Weakness of Legs: None Weakness of Arms/Hands: None  Permission Sought/Granted Permission sought to share information with : Facility Art therapist granted to share information with : Yes, Verbal Permission Granted              Emotional Assessment       Orientation: : Oriented to Self,Oriented to Place,Oriented to  Time,Oriented to Situation      Admission diagnosis:  Fall [W19.XXXA] Femoral distal fracture ([place]) [S72.409A] Type I or II open fracture of distal end of left femur, unspecified fracture morphology, initial encounter [facility]) [S72.402B] Patient Active Problem List   Diagnosis Date Noted  . Femoral distal fracture ([place]) 10/30/2020  . Fall 10/30/2020  . COVID-19 virus infection 10/30/2020  . Brain metastases ([place]) 08/27/2020  . Malignant neoplasm of lung ([place])   . Orthostatic hypotension   . Syncope 07/09/2020  . Slurred speech 07/08/2020  . Uncontrolled type 2 diabetes mellitus with hyperglycemia, with long-term current use of insulin ([place]) 07/08/2020  . Unsteady gait when walking 07/07/2020  . Small cell lung cancer, right upper lobe ([place]) 01/19/2020  . Encounter for antineoplastic chemotherapy 01/19/2020  . Encounter for antineoplastic immunotherapy 01/19/2020  . Extensive stage primary small cell carcinoma of lung ([place]) 01/04/2020  . Goals of care, counseling/discussion 01/04/2020  . Mediastinal adenopathy   .  Mass of upper lobe of right lung   . Brain mass 12/23/2019  . Vasogenic cerebral edema ([place]) 12/23/2019  . Left leg paresthesias   . History of CVA with residual deficit   . Acute on chronic systolic congestive heart failure ([place])   . Left-sided weakness   . 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 Cocaine abuse ([place]) 01/24/2015  . Essential hypertension 01/24/2015  .  Dysphagia 01/24/2015  . Left hemiplegia ([place]) 01/24/2015  . Hyperlipidemia associated with type 2 diabetes mellitus ([place]) 11/30/2014  . Chronic systolic heart failure ([place]) 06/20/2014  . Automatic implantable cardioverter-defibrillator in situ   . Long term (current) use of anticoagulants 08/09/2013  . Drug abuse and dependence ([place])   . Cardiomyopathy, ischemic 07/21/2011  . Type II diabetes mellitus with ophthalmic manifestations ([place]) 09/13/2007   PCP:  [name], MD Pharmacy:   [facility] [place] [name] [place] [address] Phone: [phone] Fax: [phone]     Social Determinants of Health (SDOH) Interventions    Readmission Risk Interventions No flowsheet data found.

## 2020-11-02 NOTE — Plan of Care (Signed)

## 2020-11-02 NOTE — NC FL2 (Addendum)
[place] LEVEL OF CARE SCREENING TOOL     IDENTIFICATION  Patient Name: Dennis [name]. Birthdate: 1961/07/03 Sex: male Admission Date (Current Location): 10/30/2020  [facility] and [place] Number:  Herbalist and Address:  [facility] [place] [address], [place], [place] [address]      Provider Number: [id number]  Attending Physician Name and Address:  Bonnielee Haff, MD  Relative Name and Phone Number:       Current Level of Care: Hospital Recommended Level of Care: [place] Prior Approval Number:    Date Approved/Denied:   PASRR Number:    Discharge Plan: SNF    Current Diagnoses: Patient Active Problem List   Diagnosis Date Noted  . Femoral distal fracture ([place]) 10/30/2020  . Fall 10/30/2020  . COVID-19 virus infection 10/30/2020  . Brain metastases ([place]) 08/27/2020  . Malignant neoplasm of lung ([place])   . Orthostatic hypotension   . Syncope 07/09/2020  . Slurred speech 07/08/2020  . Uncontrolled type 2 diabetes mellitus with hyperglycemia, with long-term current use of insulin ([place]) 07/08/2020  . Unsteady gait when walking 07/07/2020  . Small cell lung cancer, right upper lobe ([place]) 01/19/2020  . Encounter for antineoplastic chemotherapy 01/19/2020  . Encounter for antineoplastic immunotherapy 01/19/2020  . Extensive stage primary small cell carcinoma of lung ([place]) 01/04/2020  . Goals of care, counseling/discussion 01/04/2020  . Mediastinal adenopathy   . Mass of upper lobe of right lung   . Brain mass 12/23/2019  . Vasogenic cerebral edema ([place]) 12/23/2019  . Left leg paresthesias   . History of CVA with residual deficit   . Acute on chronic systolic congestive heart failure ([place])   . Left-sided weakness   . 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 Cocaine abuse ([place]) 01/24/2015  . Essential hypertension 01/24/2015  . Dysphagia 01/24/2015  . Left hemiplegia ([place]) 01/24/2015  . Hyperlipidemia  associated with type 2 diabetes mellitus ([place]) 11/30/2014  . Chronic systolic heart failure ([place]) 06/20/2014  . Automatic implantable cardioverter-defibrillator in situ   . Long term (current) use of anticoagulants 08/09/2013  . Drug abuse and dependence ([place])   . Cardiomyopathy, ischemic 07/21/2011  . Type II diabetes mellitus with ophthalmic manifestations ([place]) 09/13/2007    Orientation RESPIRATION BLADDER Height & Weight     Self,Time,Situation,Place  Normal Continent Weight: 191 lb 12.8 oz (87 kg) Height:  6' (182.9 cm)  BEHAVIORAL SYMPTOMS/MOOD NEUROLOGICAL BOWEL NUTRITION STATUS      Continent    AMBULATORY STATUS COMMUNICATION OF NEEDS Skin   Limited Assist Verbally Surgical wounds                       Personal Care Assistance Level of Assistance  Bathing,Dressing Bathing Assistance: Limited assistance   Dressing Assistance: Limited assistance     Functional Limitations Info             SPECIAL CARE FACTORS FREQUENCY  PT (By licensed PT),OT (By licensed OT)                    Contractures      Additional Factors Info  Code Status Code Status Info: FULL CODE             Current Medications (11/02/2020):  This is the current hospital active medication list Current Facility-Administered Medications  Medication Dose Route Frequency Provider Last Rate Last Admin  . acetaminophen (TYLENOL) tablet 325-650 mg  325-650  mg Oral Q6H PRN Delray Alt, PA-C      . albuterol (VENTOLIN HFA) 108 (90 Base) MCG/ACT inhaler 2 puff  2 puff Inhalation Q6H PRN Patrecia Pace A, PA-C      . ascorbic acid (VITAMIN C) tablet 500 mg  500 mg Oral Daily Patrecia Pace A, PA-C   500 mg at 11/02/20 2956  . atorvastatin (LIPITOR) tablet 20 mg  20 mg Oral Daily Patrecia Pace A, PA-C   20 mg at 11/02/20 2130  . Chlorhexidine Gluconate Cloth 2 % PADS 6 each  6 each Topical Daily Delray Alt, PA-C   6 each at 11/02/20 8657  . chlorpheniramine-HYDROcodone (TUSSIONEX) 10-8  MG/5ML suspension 5 mL  5 mL Oral Q12H PRN Delray Alt, PA-C      . cholecalciferol (VITAMIN D3) tablet 2,000 Units  2,000 Units Oral BID Delray Alt, PA-C   2,000 Units at 11/02/20 8469  . dexamethasone (DECADRON) injection 4 mg  4 mg Intravenous Q12H Delray Alt, PA-C   4 mg at 11/02/20 6295  . feeding supplement (GLUCERNA SHAKE) (GLUCERNA SHAKE) liquid 237 mL  237 mL Oral BID BM Bonnielee Haff, MD   237 mL at 11/02/20 1255  . guaiFENesin-dextromethorphan (ROBITUSSIN DM) 100-10 MG/5ML syrup 10 mL  10 mL Oral Q4H PRN Delray Alt, PA-C      . HYDROcodone-acetaminophen (NORCO) 7.5-325 MG per tablet 1-2 tablet  1-2 tablet Oral Q6H PRN Delray Alt, PA-C      . HYDROcodone-acetaminophen (NORCO/VICODIN) 5-325 MG per tablet 1-2 tablet  1-2 tablet Oral Q6H PRN Delray Alt, PA-C   1 tablet at 11/01/20 1715  . insulin aspart (novoLOG) injection 0-20 Units  0-20 Units Subcutaneous TID WC Bonnielee Haff, MD   11 Units at 11/02/20 1254  . insulin aspart (novoLOG) injection 0-5 Units  0-5 Units Subcutaneous QHS Bonnielee Haff, MD   2 Units at 11/01/20 2300  . insulin aspart (novoLOG) injection 6 Units  6 Units Subcutaneous TID WC Patrecia Pace A, PA-C   6 Units at 11/02/20 1254  . insulin glargine (LANTUS) injection 20 Units  20 Units Subcutaneous BID Bonnielee Haff, MD      . lidocaine-prilocaine (EMLA) cream 1 application  1 application Topical PRN Delray Alt, PA-C      . methocarbamol (ROBAXIN) tablet 500 mg  500 mg Oral Q6H PRN Delray Alt, PA-C      . midodrine (PROAMATINE) tablet 10 mg  10 mg Oral TID WC Patrecia Pace A, PA-C   10 mg at 11/02/20 1254  . morphine 2 MG/ML injection 0.5-1 mg  0.5-1 mg Intravenous Q4H PRN Patrecia Pace A, PA-C      . multivitamin with minerals tablet 1 tablet  1 tablet Oral Daily Bonnielee Haff, MD   1 tablet at 11/02/20 0905  . senna (SENOKOT) tablet 8.6 mg  1 tablet Oral BID Patrecia Pace A, PA-C   8.6 mg at 11/02/20 2841  .  senna-docusate (Senokot-S) tablet 1 tablet  1 tablet Oral Daily PRN Patrecia Pace A, PA-C      . sodium chloride flush (NS) 0.9 % injection 10-40 mL  10-40 mL Intracatheter Q12H Bonnielee Haff, MD   10 mL at 11/02/20 0918  . sodium chloride flush (NS) 0.9 % injection 10-40 mL  10-40 mL Intracatheter PRN Bonnielee Haff, MD      . sodium chloride tablet 1 g  1 g Oral q AM Delray Alt, PA-C  1 g at 11/02/20 0904  . tamsulosin (FLOMAX) capsule 0.4 mg  0.4 mg Oral Daily Patrecia Pace A, PA-C   0.4 mg at 11/02/20 0626  . warfarin (COUMADIN) tablet 6 mg  6 mg Oral [address], [place]      . Warfarin - Pharmacist Dosing Inpatient   Does not apply q1600 Rolla Flatten, [facility]      . zinc sulfate capsule 220 mg  220 mg Oral Daily Delray Alt, PA-C   220 mg at 11/02/20 9485     Discharge Medications: Please see discharge summary for a list of discharge medications.  Relevant Imaging Results:  Relevant Lab Results:   Additional Information COVID vaccinated x2 plus booster. COVID + 1/18  SS# [id number]  Amador Cunas, LCSW

## 2020-11-02 NOTE — Care Management Important Message (Signed)
Important Message  Patient Details  Name: Dennis [name]. MRN: [id number] Date of Birth: 1961-09-10   Medicare Important Message Given:  Yes - Important Message mailed due to current National Emergency   Verbal consent obtained due to current National Emergency  Relationship to patient: Self Contact Name: Masiyah Engen Call Date: 11/02/20  Time: 1416 Phone: [phone] Outcome: Spoke with contact Important Message mailed to: Patient address on file    Delorse Lek 11/02/2020, 2:16 PM

## 2020-11-02 NOTE — Progress Notes (Signed)
Orthopaedic Trauma Progress Note  SUBJECTIVE: Reports mild pain about operative site, current pain regimen helping. No chest pain. No SOB. No nausea/vomiting. No other complaints. Feels like therapies went well yesterday  OBJECTIVE:  Vitals:   11/02/20 0500 11/02/20 0834  BP: 122/78 105/77  Pulse: 90 98  Resp:  16  Temp: 98.7 F (37.1 C) 98.1 F (36.7 C)  SpO2: 98% 92%    General: Sitting up in bed, NAD Respiratory: No increased work of breathing.  LLE: Ace wrap removed, mepilex dressing left in place currently CDI. No significant tenderness with palpation through leg. Holds knee in about 10 degrees of flexion, but does tolerate motion to extend.  Ankle dorsiflexion/plantarflexion intact. Endorses sensation to light touch distally. 2+ DP pulse    IMAGING: Stable post op imaging.   LABS:  Results for orders placed or performed during the hospital encounter of 10/30/20 (from the past 24 hour(s))  Glucose, capillary     Status: Abnormal   Collection Time: 11/01/20 12:29 PM  Result Value Ref Range   Glucose-Capillary 227 (H) 70 - 99 mg/dL  Glucose, capillary     Status: Abnormal   Collection Time: 11/01/20  4:57 PM  Result Value Ref Range   Glucose-Capillary 187 (H) 70 - 99 mg/dL  Glucose, capillary     Status: Abnormal   Collection Time: 11/01/20  8:36 PM  Result Value Ref Range   Glucose-Capillary 221 (H) 70 - 99 mg/dL  CBC with Differential/Platelet     Status: Abnormal   Collection Time: 11/02/20  3:36 AM  Result Value Ref Range   WBC 15.5 (H) 4.0 - 10.5 K/uL   RBC 3.09 (L) 4.22 - 5.81 MIL/uL   Hemoglobin 9.1 (L) 13.0 - 17.0 g/dL   HCT 30.1 (L) 39.0 - 52.0 %   MCV 97.4 80.0 - 100.0 fL   MCH 29.4 26.0 - 34.0 pg   MCHC 30.2 30.0 - 36.0 g/dL   RDW 14.5 11.5 - 15.5 %   Platelets 179 150 - 400 K/uL   nRBC 0.1 0.0 - 0.2 %   Neutrophils Relative % 86 %   Neutro Abs 13.3 (H) 1.7 - 7.7 K/uL   Lymphocytes Relative 6 %   Lymphs Abs 1.0 0.7 - 4.0 K/uL   Monocytes Relative 7 %    Monocytes Absolute 1.0 0.1 - 1.0 K/uL   Eosinophils Relative 0 %   Eosinophils Absolute 0.1 0.0 - 0.5 K/uL   Basophils Relative 0 %   Basophils Absolute 0.0 0.0 - 0.1 K/uL   Immature Granulocytes 1 %   Abs Immature Granulocytes 0.12 (H) 0.00 - 0.07 K/uL  Comprehensive metabolic panel     Status: Abnormal   Collection Time: 11/02/20  3:36 AM  Result Value Ref Range   Sodium 136 135 - 145 mmol/L   Potassium 4.4 3.5 - 5.1 mmol/L   Chloride 103 98 - 111 mmol/L   CO2 25 22 - 32 mmol/L   Glucose, Bld 269 (H) 70 - 99 mg/dL   BUN 29 (H) 6 - 20 mg/dL   Creatinine, Ser 1.31 (H) 0.61 - 1.24 mg/dL   Calcium 8.5 (L) 8.9 - 10.3 mg/dL   Total Protein 5.2 (L) 6.5 - 8.1 g/dL   Albumin 2.3 (L) 3.5 - 5.0 g/dL   AST 18 15 - 41 U/L   ALT 12 0 - 44 U/L   Alkaline Phosphatase 79 38 - 126 U/L   Total Bilirubin 0.5 0.3 - 1.2 mg/dL  GFR, Estimated >60 >60 mL/min   Anion gap 8 5 - 15  C-reactive protein     Status: Abnormal   Collection Time: 11/02/20  3:36 AM  Result Value Ref Range   CRP 4.9 (H) <1.0 mg/dL  D-dimer, quantitative (not at [facility])     Status: Abnormal   Collection Time: 11/02/20  3:36 AM  Result Value Ref Range   D-Dimer, Quant 1.42 (H) 0.00 - 0.50 ug/mL-FEU  Magnesium     Status: None   Collection Time: 11/02/20  3:36 AM  Result Value Ref Range   Magnesium 2.0 1.7 - 2.4 mg/dL  Phosphorus     Status: None   Collection Time: 11/02/20  3:36 AM  Result Value Ref Range   Phosphorus 2.7 2.5 - 4.6 mg/dL  Protime-INR     Status: Abnormal   Collection Time: 11/02/20  3:36 AM  Result Value Ref Range   Prothrombin Time 16.4 (H) 11.4 - 15.2 seconds   INR 1.4 (H) 0.8 - 1.2  Glucose, capillary     Status: Abnormal   Collection Time: 11/02/20  8:21 AM  Result Value Ref Range   Glucose-Capillary 271 (H) 70 - 99 mg/dL    ASSESSMENT: Dennis [name]. is a 60 y.o. male, 2 Days Post-Op s/p RETROGRADE IMN LEFT FEMUR  CV/Blood loss: Acute blood loss anemia, Hgb 9.1 this morning.  Hemodynamically stable  PLAN: Weightbearing: WBAT LLE Incisional and dressing care:Ok to leave incisions open to air.   Showering: Ok to begin showering with assistance 11/03/20 Orthopedic device(s): None  Pain management:  1. Tylenol 325-650 mg q 6 hours PRN 2. Robaxin 500 mg q 6 hours PRN 3. Norco 5-325 mg q 6 hours PRN moderate pain/ Norco 7.5-325 mg q 6 hours PRN severe pain 4. Morphine 0.5-1 mg q 2 hours PRN VTE prophylaxis:  Coumadin SCDs ID:  Ancef 2gm post op completed Foley/Lines:  No foley, KVO IVFs Impediments to Fracture Healing: Vit D level 12, start on D3 supplementation Dispo: Therapies as tolerated. PT/OT recommending SNF. Ok for d/c from ortho standpoint once cleared by medicine and therapies  Follow - up plan: 2 weeks  Contact information:  Katha Hamming MD, Patrecia Pace PA-C. After hours and holidays please check [email or web address] for group call information for [facility]   Katharin Schneider A. Ricci Barker, PA-C [phone] (office) [email or web address]

## 2020-11-03 DIAGNOSIS — I5022 Chronic systolic (congestive) heart failure: Secondary | ICD-10-CM | POA: Diagnosis not present

## 2020-11-03 DIAGNOSIS — U071 COVID-19: Secondary | ICD-10-CM | POA: Diagnosis not present

## 2020-11-03 DIAGNOSIS — F1410 Cocaine abuse, uncomplicated: Secondary | ICD-10-CM | POA: Diagnosis not present

## 2020-11-03 DIAGNOSIS — E1165 Type 2 diabetes mellitus with hyperglycemia: Secondary | ICD-10-CM | POA: Diagnosis not present

## 2020-11-03 LAB — COMPREHENSIVE METABOLIC PANEL
ALT: 10 U/L (ref 0–44)
AST: 19 U/L (ref 15–41)
Albumin: 2.3 g/dL — ABNORMAL LOW (ref 3.5–5.0)
Alkaline Phosphatase: 77 U/L (ref 38–126)
Anion gap: 9 (ref 5–15)
BUN: 33 mg/dL — ABNORMAL HIGH (ref 6–20)
CO2: 23 mmol/L (ref 22–32)
Calcium: 9 mg/dL (ref 8.9–10.3)
Chloride: 104 mmol/L (ref 98–111)
Creatinine, Ser: 1.26 mg/dL — ABNORMAL HIGH (ref 0.61–1.24)
GFR, Estimated: >60 mL/min (ref >60)
Glucose, Bld: 180 mg/dL — ABNORMAL HIGH (ref 70–99)
Potassium: 4.4 mmol/L (ref 3.5–5.1)
Sodium: 136 mmol/L (ref 135–145)
Total Bilirubin: 0.6 mg/dL (ref 0.3–1.2)
Total Protein: 5.5 g/dL — ABNORMAL LOW (ref 6.5–8.1)

## 2020-11-03 LAB — GLUCOSE, CAPILLARY
Glucose-Capillary: 181 mg/dL — ABNORMAL HIGH (ref 70–99)
Glucose-Capillary: 156 mg/dL — ABNORMAL HIGH (ref 70–99)
Glucose-Capillary: 210 mg/dL — ABNORMAL HIGH (ref 70–99)
Glucose-Capillary: 227 mg/dL — ABNORMAL HIGH (ref 70–99)

## 2020-11-03 LAB — CBC WITH DIFFERENTIAL/PLATELET
Abs Immature Granulocytes: 0.11 10*3/uL — ABNORMAL HIGH (ref 0.00–0.07)
Basophils Absolute: 0 10*3/uL (ref 0.0–0.1)
Basophils Relative: 0 %
Eosinophils Absolute: 0 10*3/uL (ref 0.0–0.5)
Eosinophils Relative: 0 %
HCT: 30 % — ABNORMAL LOW (ref 39.0–52.0)
Hemoglobin: 9.6 g/dL — ABNORMAL LOW (ref 13.0–17.0)
Immature Granulocytes: 1 %
Lymphocytes Relative: 8 %
Lymphs Abs: 1.3 10*3/uL (ref 0.7–4.0)
MCH: 30.9 pg (ref 26.0–34.0)
MCHC: 32 g/dL (ref 30.0–36.0)
MCV: 96.5 fL (ref 80.0–100.0)
Monocytes Absolute: 0.9 10*3/uL (ref 0.1–1.0)
Monocytes Relative: 5 %
Neutro Abs: 13.8 10*3/uL — ABNORMAL HIGH (ref 1.7–7.7)
Neutrophils Relative %: 86 %
Platelets: 191 10*3/uL (ref 150–400)
RBC: 3.11 MIL/uL — ABNORMAL LOW (ref 4.22–5.81)
RDW: 14.6 % (ref 11.5–15.5)
WBC: 16.1 10*3/uL — ABNORMAL HIGH (ref 4.0–10.5)
nRBC: 0.4 % — ABNORMAL HIGH (ref 0.0–0.2)

## 2020-11-03 LAB — PHOSPHORUS: Phosphorus: 3.2 mg/dL (ref 2.5–4.6)

## 2020-11-03 LAB — MAGNESIUM: Magnesium: 1.8 mg/dL (ref 1.7–2.4)

## 2020-11-03 LAB — PROTIME-INR
INR: 1.7 — ABNORMAL HIGH (ref 0.8–1.2)
Prothrombin Time: 18.9 seconds — ABNORMAL HIGH (ref 11.4–15.2)

## 2020-11-03 LAB — D-DIMER, QUANTITATIVE: D-Dimer, Quant: 1.77 ug/mL-FEU — ABNORMAL HIGH (ref 0.00–0.50)

## 2020-11-03 LAB — C-REACTIVE PROTEIN: CRP: 4 mg/dL — ABNORMAL HIGH (ref <1.0)

## 2020-11-03 MED ORDER — WARFARIN SODIUM 4 MG PO TABS
4.0000 mg | ORAL_TABLET | Freq: Once | ORAL | Status: AC
  Administered 2020-11-03: 4 mg via ORAL
  Filled 2020-11-03: qty 1

## 2020-11-03 MED ORDER — INSULIN ASPART 100 UNIT/ML ~~LOC~~ SOLN
3.0000 [IU] | Freq: Three times a day (TID) | SUBCUTANEOUS | Status: DC
  Administered 2020-11-03 – 2020-11-10 (×19): 3 [IU] via SUBCUTANEOUS

## 2020-11-03 MED ORDER — DEXAMETHASONE 4 MG PO TABS
4.0000 mg | ORAL_TABLET | Freq: Every day | ORAL | Status: DC
  Administered 2020-11-04 – 2020-11-06 (×3): 4 mg via ORAL
  Filled 2020-11-03 (×3): qty 1

## 2020-11-03 NOTE — Plan of Care (Signed)
  Problem: Pain Managment: Goal: General experience of comfort will improve Outcome: Progressing   Problem: Safety: Goal: Ability to remain free from injury will improve Outcome: Progressing   Problem: Skin Integrity: Goal: Risk for impaired skin integrity will decrease Outcome: Progressing   

## 2020-11-03 NOTE — Plan of Care (Signed)
?  Problem: Clinical Measurements: ?Goal: Will remain free from infection ?Outcome: Progressing ?Goal: Diagnostic test results will improve ?Outcome: Progressing ?Goal: Respiratory complications will improve ?Outcome: Progressing ?  ?

## 2020-11-03 NOTE — Progress Notes (Signed)
ANTICOAGULATION CONSULT NOTE - Follow Up Consult  Pharmacy Consult for Warfarin Indication: CVA  No Known Allergies  Patient Measurements: Height: 6' (182.9 cm) Weight: 85.7 kg (188 lb 15 oz) IBW/kg (Calculated) : 77.6 Heparin Dosing Weight: 85.3 kg  Vital Signs: Temp: 97.7 F (36.5 C) (01/22 0645) Temp Source: Oral (01/22 0645) BP: 117/82 (01/22 0645) Pulse Rate: 94 (01/22 0645)  Labs: Recent Labs    11/01/20 0400 11/02/20 0336 11/03/20 0408  HGB 9.5* 9.1* 9.6*  HCT 31.1* 30.1* 30.0*  PLT 177 179 191  LABPROT 16.6* 16.4* 18.9*  INR 1.4* 1.4* 1.7*  CREATININE 1.50* 1.31* 1.26*    Estimated Creatinine Clearance: 70.1 mL/min (A) (by C-G formula based on SCr of 1.26 mg/dL (H)).   Assessment: 78 YOM on warfarin PTA for hx CVA. Admitted with INR 2.5 on PTA dose of 3.75 mg daily EXCEPT for 7.5 mg on Tues (+/- Thurs). Held and reversed for repair of femur fracture with ortho. Bridged with Heparin pre-op. Post-op to resume Warfarin dosing without a bridge.  INR today is up to 1.4 (after 2 days of bolus dosing) but remains subtherapeutic. No bleeding noted, Hgb stable post-op, platelets are normal.  Goal of Therapy:  INR 2-3 Monitor platelets by anticoagulation protocol: Yes   Plan:  - Warfarin 4 mg x 1 dose at 1800 today - Daily PT/INR, CBC q72h - Will continue to monitor for any signs/symptoms of bleeding and will follow up with PT/INR in the a.m.    Thank you for involving pharmacy in this patient's care.  Kerby Nora, PharmD, BCPS Clinical Pharmacist  Please check [email or web address] for unit-specific pharmacist phone numbers

## 2020-11-03 NOTE — Plan of Care (Signed)

## 2020-11-03 NOTE — Progress Notes (Signed)
[facility] PROGRESS NOTE   Dennis [name]. [id number] DOB: 04-15-1961 DOA: 10/30/2020  PCP: Dennis Pee, MD  Brief History/Interval Summary: 60 y.o. 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  He landed on his butt, and immediately saw the deformity to his left leg.  Patient admits that he has had a intermittently productive cough over the last few days.  He had been around his brother and sister-in-law who tested positive for COVID.  Patient reports that he just received his booster shot approximately 2 months ago.  Patient was found to have left distal femur fracture.  He was hospitalized for further management.    Reason for Visit: Left distal femur fracture.  COVID-19 infection  Consultants: Orthopedics.  Cardiology.  Procedures: 1/19: Retrograde intramedullary nailing of left femur fracture   Antibiotics: Anti-infectives (From admission, onward)   Start     Dose/Rate Route Frequency Ordered Stop   11/01/20 1000  remdesivir 100 mg in sodium chloride 0.9 % 100 mL IVPB       "Followed by" Linked Group Details   100 mg 200 mL/hr over 30 Minutes Intravenous Daily 10/31/20 1229 11/02/20 0953   10/31/20 2100  ceFAZolin (ANCEF) IVPB 2g/100 mL premix        2 g 200 mL/hr over 30 Minutes Intravenous Every 8 hours 10/31/20 1512 11/01/20 1716   10/31/20 1600  remdesivir 200 mg in sodium chloride 0.9% 250 mL IVPB       "Followed by" Linked Group Details   200 mg 580 mL/hr over 30 Minutes Intravenous Once 10/31/20 1229 10/31/20 2257   10/31/20 1406  vancomycin (VANCOCIN) powder  Status:  Discontinued          As needed 10/31/20 1411 10/31/20 1452   10/31/20 0600  ceFAZolin (ANCEF) IVPB 2g/100 mL premix        2 g 200 mL/hr over 30 Minutes  Intravenous To Short Stay 10/30/20 1848 10/31/20 1255      Subjective/Interval History: Patient denies any complaints this morning. Pain in the left leg is reasonably well controlled. Denies any chest pain shortness of breath or cough.    Assessment/Plan:  Distal left femur fracture Patient underwent intramedullary nail placement on 1/19. Orthopedics following. Pain control. PT and OT following.  COVID-19 infection/acute respiratory failure with hypoxia He had exposure this past weekend from his brother and sister-in-law who tested positive for COVID-19.  Patient has minimal cough.  Chest x-ray raise concern for interstitial prominence concerning for possible pneumonitis or CHF.  BNP was noted to be elevated at 795.  Patient positive for COVID-19.   Patient was given 1 dose of IV furosemide.   Patient is stable from a respiratory standpoint. He was given a 3-day course of Remdesivir. CRP improved. D-dimer stable. Continue incentive spirometry and mobilization. He does not have any oxygen requirements currently.  COVID-19 Labs  Recent Labs    11/01/20 0400 11/02/20 0336 11/03/20 0408  DDIMER 3.18* 1.42* 1.77*  FERRITIN 173  --   --   CRP 6.5* 4.9* 4.0*    Chronic systolic CHF/AICD in situ/chronic anticoagulation EF noted to be 25 to 30% based on echocardiogram done in September 2021.  Strict ins and outs and daily weights.   Patient was seen by cardiology  who have signed off.  No further doses of Lasix at this time.   Patient remains euvolemic. Patient on warfarin at home.  Was placed on hold due to surgery.  Warfarin has been resumed.  History of small cell lung cancer with a metastatic brain lesion Patient followed by Dr. Mickeal Skinner and Dr. Julien Nordmann.  Patient underwent whole brain radiation and chemotherapy.  Supposed to have repeat MRI brain on January 30.  Noted to be on dexamethasone which is being continued at a higher dose for now. We will start tapering dexamethasone dose  today.  Anemia of chronic disease/acute blood loss Drop in hemoglobin could be due to operative losses. Stable for the most part. No overt bleeding noted otherwise.   Orthostatic hypotension Stable.  Continue with midodrine and salt tablets.  Acute kidney injury Creatinine went up to 1.5 after he was given furosemide. Improved and stable for the last 2 days. Monitor urine output. No further diuretics at this time.  Dysphagia Recent speech evaluation revealed mild oropharyngeal dysphagia likely due to his prior stroke as well as brain lesion.  Aspiration precautions.  Diabetes mellitus type 2 uncontrolled with hyperglycemia HbA1c 8.7 in December.  CBGs poorly controlled likely due to higher dose of steroids. Currently on higher dose of Lantus. CBGs are stable. Continue with SSI as well.  History of stroke Patient with right MCA stroke in September 2021.  Has residual dysarthria and left-sided weakness.  PT and OT evaluation.  History of cocaine abuse Urine drug screen still positive for cocaine.    Hyperlipidemia Statin.  Leukocytosis Due to steroids.   DVT Prophylaxis: Warfarin Code Status: Full code Family Communication: Discussed with the patient.  No family at bedside Disposition Plan: SNF recommended by PT and OT  Status is: Inpatient  Remains inpatient appropriate because:Ongoing active pain requiring inpatient pain management, IV treatments appropriate due to intensity of illness or inability to take PO and Inpatient level of care appropriate due to severity of illness   Dispo: The patient is from: Home              Anticipated d/c is to: SNF              Anticipated d/c date is: 3 days              Patient currently is not medically stable to d/c.        Medications:  Scheduled: . vitamin C  500 mg Oral Daily  . atorvastatin  20 mg Oral Daily  . Chlorhexidine Gluconate Cloth  6 each Topical Daily  . cholecalciferol  2,000 Units Oral BID  . dexamethasone  (DECADRON) injection  4 mg Intravenous Q12H  . feeding supplement (GLUCERNA SHAKE)  237 mL Oral BID BM  . insulin aspart  0-20 Units Subcutaneous TID WC  . insulin aspart  0-5 Units Subcutaneous QHS  . insulin aspart  6 Units Subcutaneous TID WC  . insulin glargine  20 Units Subcutaneous BID  . midodrine  10 mg Oral TID WC  . multivitamin with minerals  1 tablet Oral Daily  . senna  1 tablet Oral BID  . sodium chloride flush  10-40 mL Intracatheter Q12H  . sodium chloride  1 g Oral q AM  . tamsulosin  0.4 mg Oral Daily  . warfarin  4 mg Oral ONCE-1800  . Warfarin - Pharmacist Dosing Inpatient   Does not apply q1600  . zinc sulfate  220 mg Oral Daily   Continuous:  WLK:[id number], albuterol, chlorpheniramine-HYDROcodone, guaiFENesin-dextromethorphan, HYDROcodone-acetaminophen, lidocaine-prilocaine, methocarbamol, senna-docusate, sodium chloride flush   Objective:  Vital Signs  Vitals:   11/02/20 2111 11/03/20 0500 11/03/20 0645 11/03/20 0851  BP: 114/88  117/82 105/80  Pulse: 95  94 94  Resp: 18  18 14   Temp: 98.4 F (36.9 C)  97.7 F (36.5 C) (!) 97.4 F (36.3 C)  TempSrc: Oral  Oral Oral  SpO2: 92%  98% 98%  Weight:  85.7 kg    Height:        Intake/Output Summary (Last 24 hours) at 11/03/2020 1243 Last data filed at 11/03/2020 1202 Gross per 24 hour  Intake 10 ml  Output 1540 ml  Net -1530 ml   Filed Weights   11/01/20 0500 11/02/20 0500 11/03/20 0500  Weight: 79.2 kg 87 kg 85.7 kg    General appearance: Awake alert.  In no distress Resp: Clear to auscultation bilaterally.  Normal effort Cardio: S1-S2 is normal regular.  No S3-S4.  No rubs murmurs or bruit. No edema GI: Abdomen is soft.  Nontender nondistended.  Bowel sounds are present normal.  No masses organomegaly Neurologic:  No focal neurological deficits.      Lab Results:  Data Reviewed: I have personally reviewed following labs and imaging studies  CBC: Recent Labs  Lab 10/30/20 1151  10/31/20 0731 11/01/20 0400 11/02/20 0336 11/03/20 0408  WBC 10.3 11.7* 12.4* 15.5* 16.1*  NEUTROABS  --  9.3* 10.9* 13.3* 13.8*  HGB 11.9* 10.8* 9.5* 9.1* 9.6*  HCT 39.5 35.9* 31.1* 30.1* 30.0*  MCV 98.5 99.7 97.8 97.4 96.5  PLT 198 170 177 179 438    Basic Metabolic Panel: Recent Labs  Lab 10/30/20 1151 10/31/20 0731 11/01/20 0400 11/02/20 0336 11/03/20 0408  NA 141 137 139 136 136  K 4.4 3.9 4.5 4.4 4.4  CL 109 108 105 103 104  CO2 22 19* 24 25 23   GLUCOSE 294* 241* 393* 269* 180*  BUN 19 16 22* 29* 33*  CREATININE 1.41* 1.15 1.50* 1.31* 1.26*  CALCIUM 8.9 8.5* 8.2* 8.5* 9.0  MG  --  1.6* 1.6* 2.0 1.8  PHOS  --  3.4 4.7* 2.7 3.2    GFR: Estimated Creatinine Clearance: 70.1 mL/min (A) (by C-G formula based on SCr of 1.26 mg/dL (H)).  Liver Function Tests: Recent Labs  Lab 10/30/20 1151 10/31/20 0731 11/01/20 0400 11/02/20 0336 11/03/20 0408  AST 33 18 16 18 19   ALT 25 22 18 12 10   ALKPHOS 95 91 80 79 77  BILITOT 1.0 0.9 0.7 0.5 0.6  PROT 5.8* 5.4* 5.2* 5.2* 5.5*  ALBUMIN 2.8* 2.5* 2.2* 2.3* 2.3*     Coagulation Profile: Recent Labs  Lab 10/30/20 1151 10/31/20 0731 11/01/20 0400 11/02/20 0336 11/03/20 0408  INR 1.9* 1.4* 1.4* 1.4* 1.7*    CBG: Recent Labs  Lab 11/02/20 1127 11/02/20 1621 11/02/20 2108 11/03/20 0635 11/03/20 1200  GLUCAP 268* 185* 118* 181* 156*     Anemia Panel: Recent Labs    11/01/20 0400  FERRITIN 173    Recent Results (from the past 240 hour(s))  Resp Panel by RT-PCR (Flu A&B, Covid) Nasopharyngeal Swab     Status: Abnormal   Collection Time: 10/30/20 11:56 AM   Specimen: Nasopharyngeal Swab; Nasopharyngeal(NP) swabs in vial transport medium  Result Value Ref Range Status   SARS Coronavirus 2 by RT PCR POSITIVE (A) NEGATIVE Final    Comment: RESULT CALLED TO, READ BACK BY AND VERIFIED WITH: [place] [phone]  10/30/20 A BROWNING (NOTE) SARS-CoV-2 target nucleic acids are DETECTED.  The SARS-CoV-2 RNA is  generally detectable in upper respiratory specimens during the acute phase of infection. Positive results are indicative of the presence of the identified virus, but do not rule out bacterial infection or co-infection with other pathogens not detected by the test. Clinical correlation with patient history and other diagnostic information is necessary to determine patient infection status. The expected result is Negative.  Fact Sheet for Patients: EntrepreneurPulse.com.au  Fact Sheet for Healthcare Providers: IncredibleEmployment.be  This test is not yet approved or cleared by the [place] FDA and  has been authorized for detection and/or diagnosis of SARS-CoV-2 by FDA under an Emergency Use Authorization (EUA).  This EUA will remain in effect (meaning this test can  be used) for the duration of  the COVID-19 declaration under Section 564(b)(1) of the Act, 21 U.S.C. section 360bbb-3(b)(1), unless the authorization is terminated or revoked sooner.     Influenza A by PCR NEGATIVE NEGATIVE Final   Influenza B by PCR NEGATIVE NEGATIVE Final    Comment: (NOTE) The Xpert Xpress SARS-CoV-2/FLU/RSV plus assay is intended as an aid in the diagnosis of influenza from Nasopharyngeal swab specimens and should not be used as a sole basis for treatment. Nasal washings and aspirates are unacceptable for Xpert Xpress SARS-CoV-2/FLU/RSV testing.  Fact Sheet for Patients: EntrepreneurPulse.com.au  Fact Sheet for Healthcare Providers: IncredibleEmployment.be  This test is not yet approved or cleared by the [place] FDA and has been authorized for detection and/or diagnosis of SARS-CoV-2 by FDA under an Emergency Use Authorization (EUA). This EUA will remain in effect (meaning this test can be used) for the duration of the COVID-19 declaration under Section 564(b)(1) of the Act, 21 U.S.C. section 360bbb-3(b)(1),  unless the authorization is terminated or revoked.  Performed at [facility], [place] [address], [place], [place] [address]   Surgical pcr screen     Status: None   Collection Time: 10/30/20  4:30 PM   Specimen: Nasal Mucosa; Nasal Swab  Result Value Ref Range Status   MRSA, PCR NEGATIVE NEGATIVE Final   Staphylococcus aureus NEGATIVE NEGATIVE Final    Comment: (NOTE) The Xpert SA Assay (FDA approved for NASAL specimens in patients 53 years of age and older), is one component of a comprehensive surveillance program. It is not intended to diagnose infection nor to guide or monitor treatment. Performed at [facility], [place] [address] [place], [place] [address]       Radiology Studies: No results found.     LOS: 4 days   Thetis Schwimmer [employer] on [email or web address]  11/03/2020, 12:43 PM

## 2020-11-04 DIAGNOSIS — U071 COVID-19: Secondary | ICD-10-CM | POA: Diagnosis not present

## 2020-11-04 DIAGNOSIS — E1165 Type 2 diabetes mellitus with hyperglycemia: Secondary | ICD-10-CM | POA: Diagnosis not present

## 2020-11-04 DIAGNOSIS — I5022 Chronic systolic (congestive) heart failure: Secondary | ICD-10-CM | POA: Diagnosis not present

## 2020-11-04 DIAGNOSIS — F1410 Cocaine abuse, uncomplicated: Secondary | ICD-10-CM | POA: Diagnosis not present

## 2020-11-04 LAB — COMPREHENSIVE METABOLIC PANEL
ALT: 14 U/L (ref 0–44)
AST: 21 U/L (ref 15–41)
Albumin: 2.2 g/dL — ABNORMAL LOW (ref 3.5–5.0)
Alkaline Phosphatase: 78 U/L (ref 38–126)
Anion gap: 8 (ref 5–15)
BUN: 33 mg/dL — ABNORMAL HIGH (ref 6–20)
CO2: 25 mmol/L (ref 22–32)
Calcium: 8.9 mg/dL (ref 8.9–10.3)
Chloride: 103 mmol/L (ref 98–111)
Creatinine, Ser: 1.31 mg/dL — ABNORMAL HIGH (ref 0.61–1.24)
GFR, Estimated: >60 mL/min (ref >60)
Glucose, Bld: 239 mg/dL — ABNORMAL HIGH (ref 70–99)
Potassium: 4 mmol/L (ref 3.5–5.1)
Sodium: 136 mmol/L (ref 135–145)
Total Bilirubin: 0.7 mg/dL (ref 0.3–1.2)
Total Protein: 5.2 g/dL — ABNORMAL LOW (ref 6.5–8.1)

## 2020-11-04 LAB — GLUCOSE, CAPILLARY
Glucose-Capillary: 217 mg/dL — ABNORMAL HIGH (ref 70–99)
Glucose-Capillary: 194 mg/dL — ABNORMAL HIGH (ref 70–99)
Glucose-Capillary: 204 mg/dL — ABNORMAL HIGH (ref 70–99)
Glucose-Capillary: 253 mg/dL — ABNORMAL HIGH (ref 70–99)

## 2020-11-04 LAB — CBC WITH DIFFERENTIAL/PLATELET
Abs Immature Granulocytes: 0.12 10*3/uL — ABNORMAL HIGH (ref 0.00–0.07)
Basophils Absolute: 0 10*3/uL (ref 0.0–0.1)
Basophils Relative: 0 %
Eosinophils Absolute: 0.1 10*3/uL (ref 0.0–0.5)
Eosinophils Relative: 1 %
HCT: 31.5 % — ABNORMAL LOW (ref 39.0–52.0)
Hemoglobin: 9.7 g/dL — ABNORMAL LOW (ref 13.0–17.0)
Immature Granulocytes: 1 %
Lymphocytes Relative: 15 %
Lymphs Abs: 2.3 10*3/uL (ref 0.7–4.0)
MCH: 29.7 pg (ref 26.0–34.0)
MCHC: 30.8 g/dL (ref 30.0–36.0)
MCV: 96.3 fL (ref 80.0–100.0)
Monocytes Absolute: 1.4 10*3/uL — ABNORMAL HIGH (ref 0.1–1.0)
Monocytes Relative: 10 %
Neutro Abs: 10.8 10*3/uL — ABNORMAL HIGH (ref 1.7–7.7)
Neutrophils Relative %: 73 %
Platelets: 196 10*3/uL (ref 150–400)
RBC: 3.27 MIL/uL — ABNORMAL LOW (ref 4.22–5.81)
RDW: 14.6 % (ref 11.5–15.5)
WBC: 14.7 10*3/uL — ABNORMAL HIGH (ref 4.0–10.5)
nRBC: 1 % — ABNORMAL HIGH (ref 0.0–0.2)

## 2020-11-04 LAB — PROTIME-INR
INR: 2.1 — ABNORMAL HIGH (ref 0.8–1.2)
Prothrombin Time: 22.7 seconds — ABNORMAL HIGH (ref 11.4–15.2)

## 2020-11-04 MED ORDER — METHOCARBAMOL 500 MG PO TABS: 500.0000 mg | ORAL_TABLET | Freq: Four times a day (QID) | ORAL | Status: AC | PRN

## 2020-11-04 MED ORDER — ADULT MULTIVITAMIN W/MINERALS CH: 1.0000 | ORAL_TABLET | Freq: Every day | ORAL | Status: DC

## 2020-11-04 MED ORDER — SODIUM CHLORIDE 1 G PO TABS: 1.0000 g | ORAL_TABLET | Freq: Every morning | ORAL | Status: DC

## 2020-11-04 MED ORDER — SENNA 8.6 MG PO TABS: 1.0000 | ORAL_TABLET | Freq: Two times a day (BID) | ORAL | 0 refills | Status: AC

## 2020-11-04 MED ORDER — WARFARIN SODIUM 2.5 MG PO TABS
3.7500 mg | ORAL_TABLET | Freq: Once | ORAL | Status: AC
  Administered 2020-11-04: 3.75 mg via ORAL
  Filled 2020-11-04: qty 1

## 2020-11-04 NOTE — Progress Notes (Signed)
[place] for Warfarin Indication: CVA  No Known Allergies  Patient Measurements: Height: 6' (182.9 cm) Weight: 85.7 kg (188 lb 15 oz) IBW/kg (Calculated) : 77.6 Heparin Dosing Weight: 85.3 kg  Vital Signs: Temp: 98.2 F (36.8 C) (01/23 0647) Temp Source: Oral (01/23 0647) BP: 100/78 (01/23 0647) Pulse Rate: 99 (01/23 0647)  Labs: Recent Labs    11/02/20 0336 11/03/20 0408 11/04/20 0350  HGB 9.1* 9.6* 9.7*  HCT 30.1* 30.0* 31.5*  PLT 179 191 196  LABPROT 16.4* 18.9* 22.7*  INR 1.4* 1.7* 2.1*  CREATININE 1.31* 1.26* 1.31*    Estimated Creatinine Clearance: 67.5 mL/min (A) (by C-G formula based on SCr of 1.31 mg/dL (H)).   Assessment: 20 YOM on warfarin PTA for hx CVA. Admitted with INR 2.5 on PTA dose of 3.75 mg daily EXCEPT for 7.5 mg on Tues (+/- Thurs). Held and reversed for repair of femur fracture with ortho. Bridged with Heparin pre-op. Post-op to resume Warfarin dosing without a bridge.  INR today is up to 2.1 after 3 days of dosing higher than PTA. No bleeding noted, Hgb stable post-op, platelets are normal. Will resume PTA warfarin dosing.  Goal of Therapy:  INR 2-3 Monitor platelets by anticoagulation protocol: Yes   Plan:  - Warfarin 3.75 mg x 1 dose at 1800 today - Daily PT/INR, CBC q72h - Will continue to monitor for any signs/symptoms of bleeding and will follow up with PT/INR in the a.m.    Thank you for involving pharmacy in this patient's care.  Norina Buzzard, PharmD PGY1 Pharmacy Resident 11/04/2020 7:14 AM  Please check [email or web address] for unit-specific pharmacist phone numbers

## 2020-11-04 NOTE — Progress Notes (Signed)
[facility] PROGRESS NOTE   Dennis [name]. ZOX:[id number] DOB: 1961-04-08 DOA: 10/30/2020  PCP: [name], MD  Brief History/Interval Summary: 60 y.o. male with medical history significant of systolic CHF last EF 81-19%, s/p AICD, CAD, right MCA CVA in 06/2020, on Coumadin, small cell lung cancer with mets to brain (dx3/2021), DM type II, cocaine abuse, and remote alcohol abuse who presented after he slipped on ice outside.  He landed on his butt, and immediately saw the deformity to his left leg.  Patient admits that he has had a intermittently productive cough over the last few days.  He had been around his brother and sister-in-law who tested positive for COVID.  Patient reports that he just received his booster shot approximately 2 months ago.  Patient was found to have left distal femur fracture.  He was hospitalized for further management.    Reason for Visit: Left distal femur fracture.  COVID-19 infection  Consultants: Orthopedics.  Cardiology.  Procedures: 1/19: Retrograde intramedullary nailing of left femur fracture   Antibiotics: Anti-infectives (From admission, onward)   Start     Dose/Rate Route Frequency Ordered Stop   11/01/20 1000  remdesivir 100 mg in sodium chloride 0.9 % 100 mL IVPB       "Followed by" Linked Group Details   100 mg 200 mL/hr over 30 Minutes Intravenous Daily 10/31/20 1229 11/02/20 0953   10/31/20 2100  ceFAZolin (ANCEF) IVPB 2g/100 mL premix        2 g 200 mL/hr over 30 Minutes Intravenous Every 8 hours 10/31/20 1512 11/01/20 1716   10/31/20 1600  remdesivir 200 mg in sodium chloride 0.9% 250 mL IVPB       "Followed by" Linked Group Details   200 mg 580 mL/hr over 30 Minutes Intravenous Once 10/31/20 1229 10/31/20 2257   10/31/20 1406  vancomycin (VANCOCIN) powder  Status:  Discontinued          As needed 10/31/20 1411 10/31/20 1452   10/31/20 0600  ceFAZolin (ANCEF) IVPB 2g/100 mL premix        2 g 200 mL/hr over 30 Minutes  Intravenous To Short Stay 10/30/20 1848 10/31/20 1255      Subjective/Interval History: Patient feels well. Pain in the left leg is well controlled. No chest pain or shortness of breath. Overall he feels better.    Assessment/Plan:  Distal left femur fracture Patient underwent intramedullary nail placement on 1/19. Orthopedics following. Pain control. PT and OT following.  COVID-19 infection/acute respiratory failure with hypoxia He had exposure this past weekend from his brother and sister-in-law who tested positive for COVID-19.  Patient has minimal cough.  Chest x-ray raise concern for interstitial prominence concerning for possible pneumonitis or CHF.  BNP was noted to be elevated at 795.  Patient positive for COVID-19.   Patient was given 1 dose of IV furosemide.   Patient is stable from a respiratory standpoint. He was given a 3-day course of Remdesivir. CRP improved. D-dimer stable. Continue incentive spirometry and mobilization. He does not have any oxygen requirements currently.  Chronic systolic CHF/AICD in situ/chronic anticoagulation EF noted to be 25 to 30% based on echocardiogram done in September 2021.  Strict ins and outs and daily weights.   Patient was seen by cardiology who have signed off.  No further doses of Lasix at this time.   Patient remains euvolemic. Patient on warfarin at home.  Was placed on hold due to surgery.  Warfarin has been resumed. INR 2.1.  History of small cell lung cancer with a metastatic brain lesion Patient followed by Dr. Mickeal Skinner and Dr. Julien Nordmann.  Patient underwent whole brain radiation and chemotherapy.  Supposed to have repeat MRI brain on January 30.  Noted to be on dexamethasone which is being continued at a higher dose for now. Dexamethasone dose is being tapered back to his usual regimen.  Anemia of chronic disease/acute blood loss Drop in hemoglobin could be due to operative losses. Stable for the most part. No overt bleeding noted  otherwise. Leukocytosis is due to steroids.  Orthostatic hypotension Stable.  Continue with midodrine and salt tablets.  Acute kidney injury Creatinine went up to 1.5 after he was given furosemide. Improved and stable for the last 3 days. Monitor urine output. No further diuretics at this time.  Dysphagia Recent speech evaluation revealed mild oropharyngeal dysphagia likely due to his prior stroke as well as brain lesion.  Aspiration precautions.  Diabetes mellitus type 2 uncontrolled with hyperglycemia HbA1c 8.7 in December.  CBGs poorly controlled likely due to higher dose of steroids. Currently on higher dose of Lantus. CBGs are stable. Continue with SSI as well. Anticipate that his insulin requirements will decrease as steroid is tapered down.  History of stroke Patient with right MCA stroke in September 2021.  Has residual dysarthria and left-sided weakness.  PT and OT evaluation.  History of cocaine abuse Urine drug screen still positive for cocaine.    Hyperlipidemia Statin.  Leukocytosis Due to steroids.   DVT Prophylaxis: Warfarin Code Status: Full code Family Communication: Discussed with the patient.  No family at bedside Disposition Plan: SNF recommended by PT and OT  Status is: Inpatient  Remains inpatient appropriate because:Ongoing active pain requiring inpatient pain management, IV treatments appropriate due to intensity of illness or inability to take PO and Inpatient level of care appropriate due to severity of illness   Dispo: The patient is from: Home              Anticipated d/c is to: SNF              Anticipated d/c date is: 3 days              Patient currently is not medically stable to d/c.        Medications:  Scheduled: . vitamin C  500 mg Oral Daily  . atorvastatin  20 mg Oral Daily  . Chlorhexidine Gluconate Cloth  6 each Topical Daily  . cholecalciferol  2,000 Units Oral BID  . dexamethasone  4 mg Oral Daily  . feeding supplement  (GLUCERNA SHAKE)  237 mL Oral BID BM  . insulin aspart  0-20 Units Subcutaneous TID WC  . insulin aspart  0-5 Units Subcutaneous QHS  . insulin aspart  3 Units Subcutaneous TID WC  . insulin glargine  20 Units Subcutaneous BID  . midodrine  10 mg Oral TID WC  . multivitamin with minerals  1 tablet Oral Daily  . senna  1 tablet Oral BID  . sodium chloride flush  10-40 mL Intracatheter Q12H  . sodium chloride  1 g Oral q AM  . tamsulosin  0.4 mg Oral Daily  . warfarin  3.75 mg Oral ONCE-1800  . Warfarin - Pharmacist Dosing Inpatient   Does not apply q1600  . zinc sulfate  220 mg Oral Daily   Continuous:  DEY:CXKGYJEHUDJSH, albuterol, chlorpheniramine-HYDROcodone, guaiFENesin-dextromethorphan, HYDROcodone-acetaminophen, lidocaine-prilocaine, methocarbamol, senna-docusate, sodium chloride flush   Objective:  Vital Signs  Vitals:  11/03/20 1343 11/03/20 2129 11/04/20 0647 11/04/20 0951  BP: (!) 116/91 (!) 124/92 100/78 101/78  Pulse: 100 100 99 97  Resp: 17 16 18    Temp: 97.8 F (36.6 C) (!) 97.5 F (36.4 C) 98.2 F (36.8 C) 97.7 F (36.5 C)  TempSrc: Oral Oral Oral Oral  SpO2: 99% 98% 95% 98%  Weight:      Height:        Intake/Output Summary (Last 24 hours) at 11/04/2020 1118 Last data filed at 11/04/2020 0700 Gross per 24 hour  Intake --  Output 1340 ml  Net -1340 ml   Filed Weights   11/01/20 0500 11/02/20 0500 11/03/20 0500  Weight: 79.2 kg 87 kg 85.7 kg    General appearance: Awake alert.  In no distress Resp: Clear to auscultation bilaterally.  Normal effort Cardio: S1-S2 is normal regular.  No S3-S4.  No rubs murmurs or bruit GI: Abdomen is soft.  Nontender nondistended.  Bowel sounds are present normal.  No masses organomegaly     Lab Results:  Data Reviewed: I have personally reviewed following labs and imaging studies  CBC: Recent Labs  Lab 10/31/20 0731 11/01/20 0400 11/02/20 0336 11/03/20 0408 11/04/20 0350  WBC 11.7* 12.4* 15.5* 16.1*  14.7*  NEUTROABS 9.3* 10.9* 13.3* 13.8* 10.8*  HGB 10.8* 9.5* 9.1* 9.6* 9.7*  HCT 35.9* 31.1* 30.1* 30.0* 31.5*  MCV 99.7 97.8 97.4 96.5 96.3  PLT 170 177 179 191 161    Basic Metabolic Panel: Recent Labs  Lab 10/31/20 0731 11/01/20 0400 11/02/20 0336 11/03/20 0408 11/04/20 0350  NA 137 139 136 136 136  K 3.9 4.5 4.4 4.4 4.0  CL 108 105 103 104 103  CO2 19* 24 25 23 25   GLUCOSE 241* 393* 269* 180* 239*  BUN 16 22* 29* 33* 33*  CREATININE 1.15 1.50* 1.31* 1.26* 1.31*  CALCIUM 8.5* 8.2* 8.5* 9.0 8.9  MG 1.6* 1.6* 2.0 1.8  --   PHOS 3.4 4.7* 2.7 3.2  --     GFR: Estimated Creatinine Clearance: 67.5 mL/min (A) (by C-G formula based on SCr of 1.31 mg/dL (H)).  Liver Function Tests: Recent Labs  Lab 10/31/20 0731 11/01/20 0400 11/02/20 0336 11/03/20 0408 11/04/20 0350  AST 18 16 18 19 21   ALT 22 18 12 10 14   ALKPHOS 91 80 79 77 78  BILITOT 0.9 0.7 0.5 0.6 0.7  PROT 5.4* 5.2* 5.2* 5.5* 5.2*  ALBUMIN 2.5* 2.2* 2.3* 2.3* 2.2*     Coagulation Profile: Recent Labs  Lab 10/31/20 0731 11/01/20 0400 11/02/20 0336 11/03/20 0408 11/04/20 0350  INR 1.4* 1.4* 1.4* 1.7* 2.1*    CBG: Recent Labs  Lab 11/03/20 0635 11/03/20 1200 11/03/20 1622 11/03/20 2125 11/04/20 0644  GLUCAP 181* 156* 210* 227* 217*      Recent Results (from the past 240 hour(s))  Resp Panel by RT-PCR (Flu A&B, Covid) Nasopharyngeal Swab     Status: Abnormal   Collection Time: 10/30/20 11:56 AM   Specimen: Nasopharyngeal Swab; Nasopharyngeal(NP) swabs in vial transport medium  Result Value Ref Range Status   SARS Coronavirus 2 by RT PCR POSITIVE (A) NEGATIVE Final    Comment: RESULT CALLED TO, READ BACK BY AND VERIFIED WITHAlfonso Patten [facility] RN 1429 10/30/20 A BROWNING (NOTE) SARS-CoV-2 target nucleic acids are DETECTED.  The SARS-CoV-2 RNA is generally detectable in upper respiratory specimens during the acute phase of infection. Positive results are indicative of the presence of the  identified virus, but do not rule  out bacterial infection or co-infection with other pathogens not detected by the test. Clinical correlation with patient history and other diagnostic information is necessary to determine patient infection status. The expected result is Negative.  Fact Sheet for Patients: EntrepreneurPulse.com.au  Fact Sheet for Healthcare Providers: IncredibleEmployment.be  This test is not yet approved or cleared by the [place] FDA and  has been authorized for detection and/or diagnosis of SARS-CoV-2 by FDA under an Emergency Use Authorization (EUA).  This EUA will remain in effect (meaning this test can  be used) for the duration of  the COVID-19 declaration under Section 564(b)(1) of the Act, 21 U.S.C. section 360bbb-3(b)(1), unless the authorization is terminated or revoked sooner.     Influenza A by PCR NEGATIVE NEGATIVE Final   Influenza B by PCR NEGATIVE NEGATIVE Final    Comment: (NOTE) The Xpert Xpress SARS-CoV-2/FLU/RSV plus assay is intended as an aid in the diagnosis of influenza from Nasopharyngeal swab specimens and should not be used as a sole basis for treatment. Nasal washings and aspirates are unacceptable for Xpert Xpress SARS-CoV-2/FLU/RSV testing.  Fact Sheet for Patients: EntrepreneurPulse.com.au  Fact Sheet for Healthcare Providers: IncredibleEmployment.be  This test is not yet approved or cleared by the [place] FDA and has been authorized for detection and/or diagnosis of SARS-CoV-2 by FDA under an Emergency Use Authorization (EUA). This EUA will remain in effect (meaning this test can be used) for the duration of the COVID-19 declaration under Section 564(b)(1) of the Act, 21 U.S.C. section 360bbb-3(b)(1), unless the authorization is terminated or revoked.  Performed at [facility], [place] [address] [place], [place] [address]   Surgical pcr  screen     Status: None   Collection Time: 10/30/20  4:30 PM   Specimen: Nasal Mucosa; Nasal Swab  Result Value Ref Range Status   MRSA, PCR NEGATIVE NEGATIVE Final   Staphylococcus aureus NEGATIVE NEGATIVE Final    Comment: (NOTE) The Xpert SA Assay (FDA approved for NASAL specimens in patients 47 years of age and older), is one component of a comprehensive surveillance program. It is not intended to diagnose infection nor to guide or monitor treatment. Performed at [facility], [place] [address] [place], [place] [address]       Radiology Studies: No results found.     LOS: 5 days   Airis Barbee [employer] on [email or web address]  11/04/2020, 11:18 AM

## 2020-11-05 DIAGNOSIS — E1165 Type 2 diabetes mellitus with hyperglycemia: Secondary | ICD-10-CM | POA: Diagnosis not present

## 2020-11-05 DIAGNOSIS — I5022 Chronic systolic (congestive) heart failure: Secondary | ICD-10-CM | POA: Diagnosis not present

## 2020-11-05 DIAGNOSIS — Z794 Long term (current) use of insulin: Secondary | ICD-10-CM | POA: Diagnosis not present

## 2020-11-05 LAB — GLUCOSE, CAPILLARY
Glucose-Capillary: 215 mg/dL — ABNORMAL HIGH (ref 70–99)
Glucose-Capillary: 295 mg/dL — ABNORMAL HIGH (ref 70–99)
Glucose-Capillary: 306 mg/dL — ABNORMAL HIGH (ref 70–99)
Glucose-Capillary: 158 mg/dL — ABNORMAL HIGH (ref 70–99)

## 2020-11-05 LAB — PROTIME-INR
INR: 2 — ABNORMAL HIGH (ref 0.8–1.2)
Prothrombin Time: 21.7 seconds — ABNORMAL HIGH (ref 11.4–15.2)

## 2020-11-05 MED ORDER — WARFARIN SODIUM 4 MG PO TABS
4.0000 mg | ORAL_TABLET | Freq: Once | ORAL | Status: AC
  Administered 2020-11-05: 4 mg via ORAL
  Filled 2020-11-05: qty 1

## 2020-11-05 NOTE — Plan of Care (Signed)

## 2020-11-05 NOTE — Progress Notes (Signed)
[facility] PROGRESS NOTE   Dennis [name]. HOZ:[id number] DOB: 1960/12/13 DOA: 10/30/2020  PCP: [name], MD  Brief History/Interval Summary: 60 y.o. male with medical history significant of systolic CHF last EF 70-48%, s/p AICD, CAD, right MCA CVA in 06/2020, on Coumadin, small cell lung cancer with mets to brain (dx3/2021), DM type II, cocaine abuse, and remote alcohol abuse who presented after he slipped on ice outside.  He landed on his butt, and immediately saw the deformity to his left leg.  Patient admits that he has had a intermittently productive cough over the last few days.  He had been around his brother and sister-in-law who tested positive for COVID.  Patient reports that he just received his booster shot approximately 2 months ago.  Patient was found to have left distal femur fracture.  He was hospitalized for further management.    Reason for Visit: Left distal femur fracture.  COVID-19 infection  Consultants: Orthopedics.  Cardiology.  Procedures: 1/19: Retrograde intramedullary nailing of left femur fracture   Antibiotics: Anti-infectives (From admission, onward)   Start     Dose/Rate Route Frequency Ordered Stop   11/01/20 1000  remdesivir 100 mg in sodium chloride 0.9 % 100 mL IVPB       "Followed by" Linked Group Details   100 mg 200 mL/hr over 30 Minutes Intravenous Daily 10/31/20 1229 11/02/20 0953   10/31/20 2100  ceFAZolin (ANCEF) IVPB 2g/100 mL premix        2 g 200 mL/hr over 30 Minutes Intravenous Every 8 hours 10/31/20 1512 11/01/20 1716   10/31/20 1600  remdesivir 200 mg in sodium chloride 0.9% 250 mL IVPB       "Followed by" Linked Group Details   200 mg 580 mL/hr over 30 Minutes Intravenous Once 10/31/20 1229 10/31/20 2257   10/31/20 1406  vancomycin (VANCOCIN) powder  Status:  Discontinued          As needed 10/31/20 1411 10/31/20 1452   10/31/20 0600  ceFAZolin (ANCEF) IVPB 2g/100 mL premix        2 g 200 mL/hr over 30 Minutes  Intravenous To Short Stay 10/30/20 1848 10/31/20 1255      Subjective/Interval History: Patient denies any new complaints.  Leg pain is well controlled.  No shortness of breath or cough.  Eating and drinking well.  Having regular bowel movements.  Passing urine.    Assessment/Plan:  Distal left femur fracture Patient underwent intramedullary nail placement on 1/19. Orthopedics following. Pain control. PT and OT following.  Waiting on skilled nursing facility for rehab  COVID-19 infection/acute respiratory failure with hypoxia He had exposure this past weekend from his brother and sister-in-law who tested positive for COVID-19.  Patient has minimal cough.  Chest x-ray raise concern for interstitial prominence concerning for possible pneumonitis or CHF.  BNP was noted to be elevated at 795.  Patient positive for COVID-19.   Patient was given 1 dose of IV furosemide.   He was given a 3-day course of Remdesivir. CRP improved. D-dimer stable. Continue incentive spirometry and mobilization.   Chronic systolic CHF/AICD in situ/chronic anticoagulation EF noted to be 25 to 30% based on echocardiogram done in September 2021.  Strict ins and outs and daily weights.  Patient was seen by cardiology who have signed off.  No further doses of Lasix at this time.   Patient remains euvolemic. Patient on warfarin at home.  Was placed on hold due to surgery.  Warfarin has been resumed.  Pharmacy is managing.  History of small cell lung cancer with a metastatic brain lesion Patient followed by Dr. Mickeal Skinner and Dr. Julien Nordmann.  Patient underwent whole brain radiation and chemotherapy.  Supposed to have repeat MRI brain on January 30.  Noted to be on dexamethasone which is being continued at a higher dose for now. Dexamethasone dose is being tapered back to his usual regimen.  Anemia of chronic disease/acute blood loss Drop in hemoglobin could be due to operative losses. Stable for the most part. No overt bleeding  noted otherwise. Leukocytosis is due to steroids.  Orthostatic hypotension Stable.  Continue with midodrine and salt tablets.  Acute kidney injury Creatinine went up to 1.5 after he was given furosemide.  Subsequently improved and has been stable.  Has been passing urine without difficulty.  Check labs every so often.  Monitor urine output. No further diuretics at this time.  Dysphagia Recent speech evaluation revealed mild oropharyngeal dysphagia likely due to his prior stroke as well as brain lesion.  Aspiration precautions.  Diabetes mellitus type 2 uncontrolled with hyperglycemia HbA1c 8.7 in December.  CBGs poorly controlled likely due to higher dose of steroids. Currently on higher dose of Lantus. CBGs are stable. Continue with SSI as well. Anticipate that his insulin requirements will decrease as steroid is tapered down.  History of stroke Patient with right MCA stroke in September 2021.  Has residual dysarthria and left-sided weakness.  PT and OT evaluation.  History of cocaine abuse Urine drug screen still positive for cocaine.    Hyperlipidemia Statin.  Leukocytosis Due to steroids.   DVT Prophylaxis: Warfarin Code Status: Full code Family Communication: Discussed with the patient.  No family at bedside Disposition Plan: SNF recommended by PT and OT  Status is: Inpatient  Remains inpatient appropriate because:Ongoing active pain requiring inpatient pain management, IV treatments appropriate due to intensity of illness or inability to take PO and Inpatient level of care appropriate due to severity of illness   Dispo: The patient is from: Home              Anticipated d/c is to: SNF              Anticipated d/c date is: 3 days              Patient currently is not medically stable to d/c.        Medications:  Scheduled: . vitamin C  500 mg Oral Daily  . atorvastatin  20 mg Oral Daily  . Chlorhexidine Gluconate Cloth  6 each Topical Daily  . cholecalciferol   2,000 Units Oral BID  . dexamethasone  4 mg Oral Daily  . feeding supplement (GLUCERNA SHAKE)  237 mL Oral BID BM  . insulin aspart  0-20 Units Subcutaneous TID WC  . insulin aspart  0-5 Units Subcutaneous QHS  . insulin aspart  3 Units Subcutaneous TID WC  . insulin glargine  20 Units Subcutaneous BID  . midodrine  10 mg Oral TID WC  . multivitamin with minerals  1 tablet Oral Daily  . senna  1 tablet Oral BID  . sodium chloride flush  10-40 mL Intracatheter Q12H  . sodium chloride  1 g Oral q AM  . tamsulosin  0.4 mg Oral Daily  . warfarin  4 mg Oral ONCE-1600  . Warfarin - Pharmacist Dosing Inpatient   Does not apply q1600  . zinc sulfate  220 mg Oral Daily   Continuous:  YNW:GNFAOZHYQMVHQ, albuterol, chlorpheniramine-HYDROcodone,  guaiFENesin-dextromethorphan, HYDROcodone-acetaminophen, lidocaine-prilocaine, methocarbamol, senna-docusate, sodium chloride flush   Objective:  Vital Signs  Vitals:   11/04/20 2122 11/05/20 0500 11/05/20 0705 11/05/20 0903  BP: 101/89  104/90 103/79  Pulse: 95  77 96  Resp:    14  Temp: 98.3 F (36.8 C)  98.3 F (36.8 C) (!) 97.5 F (36.4 C)  TempSrc: Oral  Oral Oral  SpO2: 98%  100% 95%  Weight:  85.7 kg    Height:        Intake/Output Summary (Last 24 hours) at 11/05/2020 1248 Last data filed at 11/05/2020 0700 Gross per 24 hour  Intake 600 ml  Output 2050 ml  Net -1450 ml   Filed Weights   11/02/20 0500 11/03/20 0500 11/05/20 0500  Weight: 87 kg 85.7 kg 85.7 kg   Awake alert.  In no distress Lungs are clear to auscultation bilaterally.  No wheezing rales or rhonchi S1-S2 is normal regular. Abdomen is soft.     Lab Results:  Data Reviewed: I have personally reviewed following labs and imaging studies  CBC: Recent Labs  Lab 10/31/20 0731 11/01/20 0400 11/02/20 0336 11/03/20 0408 11/04/20 0350  WBC 11.7* 12.4* 15.5* 16.1* 14.7*  NEUTROABS 9.3* 10.9* 13.3* 13.8* 10.8*  HGB 10.8* 9.5* 9.1* 9.6* 9.7*  HCT 35.9*  31.1* 30.1* 30.0* 31.5*  MCV 99.7 97.8 97.4 96.5 96.3  PLT 170 177 179 191 811    Basic Metabolic Panel: Recent Labs  Lab 10/31/20 0731 11/01/20 0400 11/02/20 0336 11/03/20 0408 11/04/20 0350  NA 137 139 136 136 136  K 3.9 4.5 4.4 4.4 4.0  CL 108 105 103 104 103  CO2 19* 24 25 23 25   GLUCOSE 241* 393* 269* 180* 239*  BUN 16 22* 29* 33* 33*  CREATININE 1.15 1.50* 1.31* 1.26* 1.31*  CALCIUM 8.5* 8.2* 8.5* 9.0 8.9  MG 1.6* 1.6* 2.0 1.8  --   PHOS 3.4 4.7* 2.7 3.2  --     GFR: Estimated Creatinine Clearance: 67.5 mL/min (A) (by C-G formula based on SCr of 1.31 mg/dL (H)).  Liver Function Tests: Recent Labs  Lab 10/31/20 0731 11/01/20 0400 11/02/20 0336 11/03/20 0408 11/04/20 0350  AST 18 16 18 19 21   ALT 22 18 12 10 14   ALKPHOS 91 80 79 77 78  BILITOT 0.9 0.7 0.5 0.6 0.7  PROT 5.4* 5.2* 5.2* 5.5* 5.2*  ALBUMIN 2.5* 2.2* 2.3* 2.3* 2.2*     Coagulation Profile: Recent Labs  Lab 11/01/20 0400 11/02/20 0336 11/03/20 0408 11/04/20 0350 11/05/20 0320  INR 1.4* 1.4* 1.7* 2.1* 2.0*    CBG: Recent Labs  Lab 11/04/20 1410 11/04/20 1752 11/04/20 2118 11/05/20 0701 11/05/20 1144  GLUCAP 194* 204* 253* 215* 295*      Recent Results (from the past 240 hour(s))  Resp Panel by RT-PCR (Flu A&B, Covid) Nasopharyngeal Swab     Status: Abnormal   Collection Time: 10/30/20 11:56 AM   Specimen: Nasopharyngeal Swab; Nasopharyngeal(NP) swabs in vial transport medium  Result Value Ref Range Status   SARS Coronavirus 2 by RT PCR POSITIVE (A) NEGATIVE Final    Comment: RESULT CALLED TO, READ BACK BY AND VERIFIED WITHAlfonso Patten [facility] RN 1429 10/30/20 A BROWNING (NOTE) SARS-CoV-2 target nucleic acids are DETECTED.  The SARS-CoV-2 RNA is generally detectable in upper respiratory specimens during the acute phase of infection. Positive results are indicative of the presence of the identified virus, but do not rule out bacterial infection or co-infection with other pathogens  not detected by the test. Clinical correlation with patient history and other diagnostic information is necessary to determine patient infection status. The expected result is Negative.  Fact Sheet for Patients: EntrepreneurPulse.com.au  Fact Sheet for Healthcare Providers: IncredibleEmployment.be  This test is not yet approved or cleared by the [place] FDA and  has been authorized for detection and/or diagnosis of SARS-CoV-2 by FDA under an Emergency Use Authorization (EUA).  This EUA will remain in effect (meaning this test can  be used) for the duration of  the COVID-19 declaration under Section 564(b)(1) of the Act, 21 U.S.C. section 360bbb-3(b)(1), unless the authorization is terminated or revoked sooner.     Influenza A by PCR NEGATIVE NEGATIVE Final   Influenza B by PCR NEGATIVE NEGATIVE Final    Comment: (NOTE) The Xpert Xpress SARS-CoV-2/FLU/RSV plus assay is intended as an aid in the diagnosis of influenza from Nasopharyngeal swab specimens and should not be used as a sole basis for treatment. Nasal washings and aspirates are unacceptable for Xpert Xpress SARS-CoV-2/FLU/RSV testing.  Fact Sheet for Patients: EntrepreneurPulse.com.au  Fact Sheet for Healthcare Providers: IncredibleEmployment.be  This test is not yet approved or cleared by the [place] FDA and has been authorized for detection and/or diagnosis of SARS-CoV-2 by FDA under an Emergency Use Authorization (EUA). This EUA will remain in effect (meaning this test can be used) for the duration of the COVID-19 declaration under Section 564(b)(1) of the Act, 21 U.S.C. section 360bbb-3(b)(1), unless the authorization is terminated or revoked.  Performed at [facility], [place] [address], [place], [place] [address]   Surgical pcr screen     Status: None   Collection Time: 10/30/20  4:30 PM   Specimen: Nasal Mucosa; Nasal  Swab  Result Value Ref Range Status   MRSA, PCR NEGATIVE NEGATIVE Final   Staphylococcus aureus NEGATIVE NEGATIVE Final    Comment: (NOTE) The Xpert SA Assay (FDA approved for NASAL specimens in patients 46 years of age and older), is one component of a comprehensive surveillance program. It is not intended to diagnose infection nor to guide or monitor treatment. Performed at [facility], [address], [place], [place] [address]       Radiology Studies: No results found.     LOS: 6 days   Trevia Nop [employer] on [email or web address]  11/05/2020, 12:48 PM

## 2020-11-05 NOTE — Discharge Instructions (Signed)

## 2020-11-05 NOTE — Progress Notes (Signed)
ANTICOAGULATION CONSULT NOTE  Pharmacy Consult:  Coumadin Indication: CVA  No Known Allergies  Patient Measurements: Height: 6' (182.9 cm) Weight: 85.7 kg (188 lb 15 oz) IBW/kg (Calculated) : 77.6 Heparin Dosing Weight: 85.3 kg  Vital Signs: Temp: 98.3 F (36.8 C) (01/24 0705) Temp Source: Oral (01/24 0705) BP: 104/90 (01/24 0705) Pulse Rate: 77 (01/24 0705)  Labs: Recent Labs    11/03/20 0408 11/04/20 0350 11/05/20 0320  HGB 9.6* 9.7*  --   HCT 30.0* 31.5*  --   PLT 191 196  --   LABPROT 18.9* 22.7* 21.7*  INR 1.7* 2.1* 2.0*  CREATININE 1.26* 1.31*  --     Estimated Creatinine Clearance: 67.5 mL/min (A) (by C-G formula based on SCr of 1.31 mg/dL (H)).   Assessment: 43 YOM on Coumadin PTA for hx CVA. Admitted with INR 1.9 on PTA dose of 3.75 mg daily EXCEPT for 7.5 mg on Tues (+/- Thurs).  Held and reversed for repair of femur fracture with ortho on 10/31/20. Coumadin resumed on 11/01/20 without a bridge.  INR therapeutic at 2 today; no bleeding reported.  Goal of Therapy:  INR 2-3 Monitor platelets by anticoagulation protocol: Yes   Plan:  Coumadin 4mg  PO today - aim to resume home regimen in AM Daily PT / INR  Dusten Ellinwood D. Mina Marble, PharmD, BCPS, [place] 11/05/2020, 8:08 AM

## 2020-11-05 NOTE — Plan of Care (Signed)
  Problem: Education: Goal: Knowledge of General Education information will improve Description: Including pain rating scale, medication(s)/side effects and non-pharmacologic comfort measures 11/05/2020 1243 by Madaline Brilliant, RN Outcome: Progressing 11/05/2020 1243 by Madaline Brilliant, RN Outcome: Progressing   Problem: Health Behavior/Discharge Planning: Goal: Ability to manage health-related needs will improve 11/05/2020 1243 by Madaline Brilliant, RN Outcome: Progressing 11/05/2020 1243 by Madaline Brilliant, RN Outcome: Progressing   Problem: Clinical Measurements: Goal: Ability to maintain clinical measurements within normal limits will improve 11/05/2020 1243 by Madaline Brilliant, RN Outcome: Progressing 11/05/2020 1243 by Madaline Brilliant, RN Outcome: Progressing Goal: Will remain free from infection 11/05/2020 1243 by Madaline Brilliant, RN Outcome: Progressing 11/05/2020 1243 by Madaline Brilliant, RN Outcome: Progressing Goal: Diagnostic test results will improve 11/05/2020 1243 by Madaline Brilliant, RN Outcome: Progressing 11/05/2020 1243 by Madaline Brilliant, RN Outcome: Progressing Goal: Respiratory complications will improve 11/05/2020 1243 by Madaline Brilliant, RN Outcome: Progressing 11/05/2020 1243 by Madaline Brilliant, RN Outcome: Progressing Goal: Cardiovascular complication will be avoided 11/05/2020 1243 by Madaline Brilliant, RN Outcome: Progressing 11/05/2020 1243 by Madaline Brilliant, RN Outcome: Progressing   Problem: Activity: Goal: Risk for activity intolerance will decrease 11/05/2020 1243 by Madaline Brilliant, RN Outcome: Progressing 11/05/2020 1243 by Madaline Brilliant, RN Outcome: Progressing   Problem: Nutrition: Goal: Adequate nutrition will be maintained 11/05/2020 1243 by Madaline Brilliant, RN Outcome: Progressing 11/05/2020 1243 by Madaline Brilliant, RN Outcome: Progressing   Problem: Coping: Goal: Level of anxiety will decrease 11/05/2020 1243 by Madaline Brilliant, RN Outcome: Progressing 11/05/2020 1243 by Madaline Brilliant, RN Outcome: Progressing   Problem: Elimination: Goal: Will not experience complications related to bowel motility 11/05/2020 1243 by Madaline Brilliant, RN Outcome: Progressing 11/05/2020 1243 by Madaline Brilliant, RN Outcome: Progressing Goal: Will not experience complications related to urinary retention 11/05/2020 1243 by Madaline Brilliant, RN Outcome: Progressing 11/05/2020 1243 by Madaline Brilliant, RN Outcome: Progressing   Problem: Pain Managment: Goal: General experience of comfort will improve 11/05/2020 1243 by Madaline Brilliant, RN Outcome: Progressing 11/05/2020 1243 by Madaline Brilliant, RN Outcome: Progressing   Problem: Safety: Goal: Ability to remain free from injury will improve 11/05/2020 1243 by Madaline Brilliant, RN Outcome: Progressing 11/05/2020 1243 by Madaline Brilliant, RN Outcome: Progressing   Problem: Skin Integrity: Goal: Risk for impaired skin integrity will decrease 11/05/2020 1243 by Madaline Brilliant, RN Outcome: Progressing 11/05/2020 1243 by Madaline Brilliant, RN Outcome: Progressing

## 2020-11-05 NOTE — Progress Notes (Signed)
Physical Therapy Treatment Patient Details Name: Dennis [name]. MRN: [id number] DOB: [birth year] Today's Date: 11/05/2020    History of Present Illness Pt is a 60 y.o. male admitted 10/30/20 after slipping on ice with fall sustaining L femur fx. S/p L femur IMN 1/19. Pt incidentally tested (+) COVID-19. PMH includes DM, HTN, CVA (2016), CAD, lung CA.   PT Comments    Pt progressing with mobility. Tolerated transfer and pre-gait/gait training with RW. Pt still with significant difficulty accepting weight onto LLE (while using RW) in order to offload RLE to take step, requiring modA to stand and maintain balance. Continue to recommend SNF-level therapies to maximize functional mobility and independence prior to return home.    Follow Up Recommendations  SNF;Supervision/Assistance - 24 hour     Equipment Recommendations  Rolling walker with 5" wheels    Recommendations for Other Services       Precautions / Restrictions Precautions Precautions: Fall Restrictions Weight Bearing Restrictions: Yes LLE Weight Bearing: Weight bearing as tolerated    Mobility  Bed Mobility Overal bed mobility: Needs Assistance Bed Mobility: Supine to Sit     Supine to sit: Min assist     General bed mobility comments: Cues for using RLE to assist LLE to EOB, minA for LLE management; minA for HHA to elevate trunk; cues for sequencing  Transfers Overall transfer level: Needs assistance Equipment used: Rolling walker (2 wheeled) Transfers: Sit to/from Stand Sit to Stand: Mod assist;Min assist         General transfer comment: Multiple sit<>stands from EOB and recliner to RW, initial modA to assist trunk elevation and stabilize RW; additional trials from recliner progressing to minA, heavy reliance on UE support pushing up from armrests; repeated cues for sequencing, technique and hand placement  Ambulation/Gait Ambulation/Gait assistance: Mod assist Gait Distance (Feet): 2 Feet Assistive  device: Rolling walker (2 wheeled) Gait Pattern/deviations: Step-to pattern     General Gait Details: Pt having extreme difficulty (seems more related to mental block than physical capabiltiy) accepting weight onto LLE in order to offload RLE for steps, pt tends to shuffle/pivot on BLEs requiring max cues and modA to maintain stability with attempts at steps; multiple 'pre-gait' attempts with weight shifts; DOE 3/4 requiring cues to not hold breath. Despite repeated cues to not stare at feet, pt continuing to do so. Intermittent seated rest breaks due to fatigue   Stairs             Wheelchair Mobility    Modified Rankin (Stroke Patients Only)       Balance Overall balance assessment: Needs assistance Sitting-balance support: Feet supported;No upper extremity supported Sitting balance-Leahy Scale: Fair       Standing balance-Leahy Scale: Poor Standing balance comment: Heavy reliance on UE support                            Cognition Arousal/Alertness: Awake/alert Behavior During Therapy: WFL for tasks assessed/performed Overall Cognitive Status: No family/caregiver present to determine baseline cognitive functioning                                 General Comments: WFL for majority of simple tasks; question some decreased attention and difficulty problem solving. Pt requires max cues for sequencing to attempt steps; internally distracted by fear of not wanting to accept weight onto LLE in order to take steps  Exercises General Exercises - Lower Extremity Quad Sets: AROM;Both;Seated Long Arc Quad: AAROM;Left;Seated    General Comments        Pertinent Vitals/Pain Pain Assessment: Faces Faces Pain Scale: Hurts little more Pain Location: LLE Pain Descriptors / Indicators: Guarding;Discomfort Pain Intervention(s): Monitored during session;Repositioned    Home Living                      Prior Function            PT  Goals (current goals can now be found in the care plan section) Progress towards PT goals: Progressing toward goals    Frequency    Min 3X/week      PT Plan Current plan remains appropriate    Co-evaluation              AM-PAC PT "6 Clicks" Mobility   Outcome Measure  Help needed turning from your back to your side while in a flat bed without using bedrails?: A Lot Help needed moving from lying on your back to sitting on the side of a flat bed without using bedrails?: A Lot Help needed moving to and from a bed to a chair (including a wheelchair)?: A Lot Help needed standing up from a chair using your arms (e.g., wheelchair or bedside chair)?: A Lot Help needed to walk in hospital room?: A Lot Help needed climbing 3-5 steps with a railing? : Total 6 Click Score: 11    End of Session Equipment Utilized During Treatment: Gait belt Activity Tolerance: Patient tolerated treatment well Patient left: in chair;with call bell/phone within reach;with chair alarm set;with SCD's reapplied Nurse Communication: Mobility status PT Visit Diagnosis: Unsteadiness on feet (R26.81);Other abnormalities of gait and mobility (R26.89);Muscle weakness (generalized) (M62.81);History of falling (Z91.81)     Time: 7017-7939 PT Time Calculation (min) (ACUTE ONLY): 28 min  Charges:  $Gait Training: 8-22 mins $Therapeutic Activity: 8-22 mins                    Mabeline Caras, PT, DPT Acute Rehabilitation Services  Pager [phone] Office [place] 11/05/2020, 4:59 PM

## 2020-11-06 DIAGNOSIS — I5022 Chronic systolic (congestive) heart failure: Secondary | ICD-10-CM | POA: Diagnosis not present

## 2020-11-06 LAB — GLUCOSE, CAPILLARY
Glucose-Capillary: 289 mg/dL — ABNORMAL HIGH (ref 70–99)
Glucose-Capillary: 188 mg/dL — ABNORMAL HIGH (ref 70–99)
Glucose-Capillary: 153 mg/dL — ABNORMAL HIGH (ref 70–99)
Glucose-Capillary: 227 mg/dL — ABNORMAL HIGH (ref 70–99)

## 2020-11-06 LAB — PROTIME-INR
INR: 2.4 — ABNORMAL HIGH (ref 0.8–1.2)
Prothrombin Time: 25 seconds — ABNORMAL HIGH (ref 11.4–15.2)

## 2020-11-06 MED ORDER — DEXAMETHASONE 2 MG PO TABS
2.0000 mg | ORAL_TABLET | Freq: Every day | ORAL | Status: DC
  Administered 2020-11-07 – 2020-11-28 (×22): 2 mg via ORAL
  Filled 2020-11-06 (×22): qty 1

## 2020-11-06 MED ORDER — WARFARIN SODIUM 2.5 MG PO TABS
3.7500 mg | ORAL_TABLET | Freq: Once | ORAL | Status: AC
  Administered 2020-11-06: 3.75 mg via ORAL
  Filled 2020-11-06: qty 1

## 2020-11-06 NOTE — TOC Progression Note (Signed)
Transition of Care (TOC) - Progression Note    Patient Details  Name: Dennis [name]. MRN: [id number] Date of Birth: 1961/06/29  Transition of Care [facility]) CM/SW [place], [place] Phone Number: 11/06/2020, 3:52 PM  Clinical Narrative:     Patient has no current SNF bed offers at this time. PT continues to work with patient.  TOC team will continue to follow.    Expected Discharge Plan: Skilled Nursing Facility Barriers to Discharge: Continued Medical Work up,No SNF bed  Expected Discharge Plan and Services Expected Discharge Plan: [place] Choice: [place] arrangements for the past 2 months: Single Family Home Expected Discharge Date: 11/06/20                                     Social Determinants of Health (SDOH) Interventions    Readmission Risk Interventions No flowsheet data found.

## 2020-11-06 NOTE — Care Management Important Message (Signed)
Important Message  Patient Details  Name: Dennis [name]. MRN: [id number] Date of Birth: September 18, 1961   Medicare Important Message Given:  Yes - Important Message mailed due to current National Emergency  Verbal consent obtained due to current National Emergency  Relationship to patient: Self Contact Name: Aodhan Scheidt Call Date: 11/06/20  Time: 1512 Phone: [phone] Outcome: No Answer/Busy Important Message mailed to: Patient address on file    Delorse Lek 11/06/2020, 3:12 PM

## 2020-11-06 NOTE — Discharge Summary (Signed)
[facility]  Physician Discharge Summary   Patient ID: Dennis [name]. MRN: [id number] DOB/AGE: September 03, 1961 60 y.o.  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 Patient is on warfarin.  Please check PT/INR per protocol. 2. Monitor CBGs. 3. Check CBC and basic metabolic panel before the end of this week 4. Needs follow-up with Dr. Doreatha Martin with orthopedics in 2 weeks    Home Health: SNF Equipment/Devices: None  CODE STATUS: Full code  DISCHARGE CONDITION: fair  Diet recommendation: Modified carbohydrate  INITIAL HISTORY: 60 y.o.malewith medical history significant ofsystolic CHF last EF 03-83%,F/XOVAN, CAD, right MCA CVAin 06/2020,on Coumadin,small cell lung cancer with mets to brain (dx3/2021),DM type II, cocaine abuse, and remote alcohol abuse who presentedafter he slipped on ice outside. He landed on his butt, and immediately saw the deformity to his left leg. Patient admits that he has had a intermittently productive cough over the last few days. He had been around his brother and sister-in-law who tested positive for COVID. Patient reports that he just received his booster shot approximately 2 months ago.  Patient was found to have left distal femur fracture.  He was hospitalized for further management.  Consultants: Orthopedics.  Cardiology.  Procedures: 1/19: Retrograde intramedullary nailing of left femur  fracture   HOSPITAL COURSE:    Distal left femur fracture Patient underwent intramedullary nail placement on 1/19.  Pain is adequately controlled.  Seen by PT and OT.  SNF is recommended.  Follow-up with orthopedics in 2 weeks.  COVID-19 infection/acute respiratory failure with hypoxia He had exposure over the weekend prior to admission from his brother and sister-in-law who tested positive for COVID-19.  Patient had minimal cough.  Chest x-ray raise concern for interstitial prominence concerning for possible pneumonitis or CHF.  BNP was noted to be elevated at 795.   Patient was given 1 dose of IV furosemide.   He was given a 3-day course of Remdesivir. CRP improved. D-dimer stable.  Continue incentive spirometry and mobilization.  He can come off of isolation on 1/29.  Chronic systolic CHF/AICD in situ/chronic anticoagulation EF noted to be 25 to 30% based on echocardiogram done in September 2021.    Patient was given 1 dose of Lasix although he does not use any diuretics at home.  Seen by cardiology.  No further diuretics at this time.  Volume status is stable.  Bensimhon on 1/27.  Since he will probably still be in the hospital then we will ask cardiology to push it next week. Patient on warfarin at home.  Was placed on hold due to surgery.  Warfarin has been resumed.    INR therapeutic.  Will need to be monitored while at SNF.  History of small cell lung cancer with a metastatic brain lesion Patient followed by Dr. Mickeal Skinner and Dr. Julien Nordmann.  Patient underwent whole brain radiation and chemotherapy.  Supposed to have repeat MRI brain on January 30.    Back on usual dose of dexamethasone.  Anemia of chronic disease/acute blood loss Drop in hemoglobin could be due to operative losses.  Stable for the most part. No overt bleeding noted otherwise. Leukocytosis is due to steroids.  Orthostatic hypotension Stable.  Continue with midodrine and salt tablets.  Acute kidney  injury Creatinine went up to 1.5 after he was given furosemide.  Subsequently improved and has been stable.    Has had good urine output.  Labs last checked on 1/23.  Can be rechecked at SNF before the end of this week.    Dysphagia Recent speech evaluation revealed mild oropharyngeal dysphagia likely due to his prior stroke as well as brain lesion.  Aspiration precautions.  Has been tolerating current diet without difficulty.  Diabetes mellitus type 2 uncontrolled with hyperglycemia HbA1c 8.7 in December.    Elevated CBGs due to higher dose of steroids.  Should keep to his usual as steroid has been tapered down.  Continue his home medication regimen.  Monitor CBGs   History of stroke Patient with right MCA stroke in September 2021.  Has residual dysarthria and left-sided weakness.  PT and OT evaluation.  History of cocaine abuse Urine drug screen still positive for cocaine.     Patient has been stable.  Okay for discharge to SNF when bed is available.  PERTINENT LABS:  The results of significant diagnostics from this hospitalization (including imaging, microbiology, ancillary and laboratory) are listed below for reference.    Microbiology: Recent Results (from the past 240 hour(s))  Resp Panel by RT-PCR (Flu A&B, Covid) Nasopharyngeal Swab     Status: Abnormal   Collection Time: 10/30/20 11:56 AM   Specimen: Nasopharyngeal Swab; Nasopharyngeal(NP) swabs in vial transport medium  Result Value Ref Range Status   SARS Coronavirus 2 by RT PCR POSITIVE (A) NEGATIVE Final    Comment: RESULT CALLED TO, READ BACK BY AND VERIFIED WITHAlfonso Patten [facility] RN [id number] 10/30/20 A BROWNING (NOTE) SARS-CoV-2 target nucleic acids are DETECTED.  The SARS-CoV-2 RNA is generally detectable in upper respiratory specimens during the acute phase of infection. Positive results are indicative of the presence of the identified virus, but do not rule out bacterial infection or co-infection with other pathogens  not detected by the test. Clinical correlation with patient history and other diagnostic information is necessary to determine patient infection status. The expected result is Negative.  Fact Sheet for Patients: EntrepreneurPulse.com.au  Fact Sheet for Healthcare Providers: IncredibleEmployment.be  This test is not yet approved or cleared by the [place] FDA and  has been authorized for detection and/or diagnosis of SARS-CoV-2 by FDA under an Emergency Use Authorization (EUA).  This EUA will remain in effect (meaning this test can  be used) for the duration of  the COVID-19 declaration under Section 564(b)(1) of the Act, 21 U.S.C. section 360bbb-3(b)(1), unless the authorization is terminated or revoked sooner.     Influenza A by PCR NEGATIVE NEGATIVE Final   Influenza B by PCR NEGATIVE NEGATIVE Final    Comment: (NOTE) The Xpert Xpress SARS-CoV-2/FLU/RSV plus assay is intended as an aid in the diagnosis of influenza from Nasopharyngeal swab specimens and should not be used as a sole basis for treatment. Nasal washings and aspirates are unacceptable for Xpert Xpress SARS-CoV-2/FLU/RSV testing.  Fact Sheet for Patients: EntrepreneurPulse.com.au  Fact Sheet for Healthcare Providers: IncredibleEmployment.be  This test is not yet approved or cleared by the [place] FDA and has been authorized for detection and/or diagnosis of SARS-CoV-2 by FDA under an Emergency Use Authorization (EUA). This EUA will remain in effect (meaning this test can be used) for the duration of  the COVID-19 declaration under Section 564(b)(1) of the Act, 21 U.S.C. section 360bbb-3(b)(1), unless the authorization is terminated or revoked.  Performed at [facility], [place] [address], [place], [place] [address]   Surgical pcr screen     Status: None   Collection Time: 10/30/20  4:30 PM   Specimen: Nasal Mucosa; Nasal  Swab  Result Value Ref Range Status   MRSA, PCR NEGATIVE NEGATIVE Final   Staphylococcus aureus NEGATIVE NEGATIVE Final    Comment: (NOTE) The Xpert SA Assay (FDA approved for NASAL specimens in patients 85 years of age and older), is one component of a comprehensive surveillance program. It is not intended to diagnose infection nor to guide or monitor treatment. Performed at [facility], [place] [address] [place], [place] [address]      Labs:  COVID-19 Labs   Lab Results  Component Value Date   [place] (A) 10/30/2020   [place] NEGATIVE 07/07/2020   [place] NEGATIVE 12/23/2019      Basic Metabolic Panel: Recent Labs  Lab 10/31/20 0731 11/01/20 0400 11/02/20 0336 11/03/20 0408 11/04/20 0350  NA 137 139 136 136 136  K 3.9 4.5 4.4 4.4 4.0  CL 108 105 103 104 103  CO2 19* _0 GLUCOSE 241* 393* 269* 180* 239*  BUN 16 22* 29* 33* 33*  CREATININE 1.15 1.50* 1.31* 1.26* 1.31*  CALCIUM 8.5* 8.2* 8.5* 9.0 8.9  MG 1.6* 1.6* 2.0 1.8  --   PHOS 3.4 4.7* 2.7 3.2  --    Liver Function Tests: Recent Labs  Lab 10/31/20 0731 11/01/20 0400 11/02/20 0336 11/03/20 0408 11/04/20 0350  AST _1 ALT _2 ALKPHOS 91 80 79 77 78  BILITOT 0.9 0.7 0.5 0.6 0.7  PROT 5.4* 5.2* 5.2* 5.5* 5.2*  ALBUMIN 2.5* 2.2* 2.3* 2.3* 2.2*   CBC: Recent Labs  Lab 10/31/20 0731 11/01/20 0400 11/02/20 0336 11/03/20 0408 11/04/20 0350  WBC 11.7* 12.4* 15.5* 16.1* 14.7*  NEUTROABS 9.3* 10.9* 13.3* 13.8* 10.8*  HGB 10.8* 9.5* 9.1* 9.6* 9.7*  HCT 35.9* 31.1* 30.1* 30.0* 31.5*  MCV 99.7 97.8 97.4 96.5 96.3  PLT 170 177 179 191 196   BNP: BNP (last 3 results) Recent Labs    10/30/20 2000  BNP 795.1*     CBG: Recent Labs  Lab 11/05/20 0701 11/05/20 1144 11/05/20 1714 11/05/20 2026 11/06/20 0859  GLUCAP 215* 295* 306* 158* 289*     IMAGING STUDIES DG Pelvis Portable  Result Date: 10/30/2020 CLINICAL DATA:  Fall  EXAM: PORTABLE PELVIS 1-2 VIEWS COMPARISON:  CT PET of January 17, 2020 FINDINGS: Degenerative changes about the bilateral hips. Irregularity along the iliopectineal line on the LEFT.  Osteopenia. No displaced fractures. IMPRESSION: 1. Irregularity along the LEFT iliopectineal line, suspicious for nondisplaced fracture about the LEFT acetabulum. 2. Degenerative changes about the hips. Electronically Signed   By: Zetta Bills M.D.   On: 10/30/2020 13:03   DG CHEST PORT 1 VIEW  Result Date: 10/30/2020 CLINICAL DATA:  Fall. EXAM: PORTABLE CHEST 1 VIEW COMPARISON:  CT 09/17/2020.  Chest x-ray 10/07/2019. FINDINGS: Port-A-Cath noted with lead tip over the right ventricle. Cardiac pacer with lead tips over the right atrium right ventricle. Cardiomegaly. Diffuse bilateral interstitial prominence. Small left pleural effusion. Findings most consistent CHF. Pneumonitis cannot be excluded. IMPRESSION: 1.  Port-A-Cath noted with lead tip over the right ventricle. 2. Cardiac pacer with lead tips  over the right atrium and right ventricle. Cardiomegaly with diffuse bilateral interstitial prominence and small left pleural effusion. Electronically Signed   By: Marcello Moores  Register   On: 10/30/2020 15:28   DG Tibia/Fibula Left Port  Result Date: 10/30/2020 CLINICAL DATA:  Fall, deformity of the knee. EXAM: PORTABLE LEFT TIBIA AND FIBULA - 2 VIEW COMPARISON:  Femoral and pelvic evaluation of the same date. FINDINGS: Overlapping stabilization device limiting assessment. No signs of fracture of the tibia or fibula. No joint effusion noted incidentally in the knee. Incidental note made of mid to distal femoral fracture at the upper margin of the submitted radiographs. Question of medial patellar subluxation versus is artifact from oblique nature of the submitted image. This could also be related to the severity of the fracture in the femur above. IMPRESSION: 1. No signs of fracture of the tibia or fibula. 2. Incidental note is made of  mid to distal femoral fracture at the upper margin of the submitted radiographs. 3. Question medial patellar subluxation versus artifact from oblique nature of the submitted image. Electronically Signed   By: Zetta Bills M.D.   On: 10/30/2020 13:06   DG C-Arm 1-60 Min  Result Date: 10/31/2020 CLINICAL DATA:  Left femoral nail placement. EXAM: DG C-ARM 1-60 MIN; LEFT FEMUR 2 VIEWS FLUOROSCOPY TIME:  Fluoroscopy Time:  2 minutes and 52 seconds Reported radiation: 18 mGy. COMPARISON:  Radiographs from October 30, 2020 FINDINGS: Eight C-arm fluoroscopic images were obtained intraoperatively and submitted for post operative interpretation. These images demonstrate intramedullary nail and screw fixation of a spiral fracture of the distal femoral diaphysis. Improved alignment. No unexpected findings. Please see the performing provider's procedural report for further detail. IMPRESSION: Intraoperative fluoroscopic imaging, as detailed above. Electronically Signed   By: Margaretha Sheffield MD   On: 10/31/2020 14:39   DG FEMUR PORT 1V LEFT  Result Date: 10/30/2020 CLINICAL DATA:  Fall, deformity about the knee. EXAM: LEFT FEMUR PORTABLE 1 VIEW COMPARISON:  None FINDINGS: Distal femoral diaphyseal spiral fracture with butterfly fragment and a half shaft with medial displacement of the distal fracture fragment, mild apex medial angulation and moderate wapex anterior angulation at the fracture site. Complete displacement and over riding noted on the lateral view, distal fracture fragment dorsally displaced relative to proximal femur. On lateral view dorsal displacement of fracture fragments are in close proximity to calcified femoral vasculature. Lateral view of the LEFT hip/proximal femur with limited evaluation due to overlap of stabilization device. IMPRESSION: Displaced and angulated spiral fracture of the distal femoral diaphysis as described. Limited evaluation of the LEFT hip/proximal femur due to overlap of  stabilization device. Markedly displaced mid to distal femoral fracture is in close proximity to calcified femoral vasculature. Consider CT for vascular assessment as clinically warranted. Electronically Signed   By: Zetta Bills M.D.   On: 10/30/2020 12:56   DG FEMUR MIN 2 VIEWS LEFT  Result Date: 10/31/2020 CLINICAL DATA:  Left femoral nail placement. EXAM: DG C-ARM 1-60 MIN; LEFT FEMUR 2 VIEWS FLUOROSCOPY TIME:  Fluoroscopy Time:  2 minutes and 52 seconds Reported radiation: 18 mGy. COMPARISON:  Radiographs from October 30, 2020 FINDINGS: Eight C-arm fluoroscopic images were obtained intraoperatively and submitted for post operative interpretation. These images demonstrate intramedullary nail and screw fixation of a spiral fracture of the distal femoral diaphysis. Improved alignment. No unexpected findings. Please see the performing provider's procedural report for further detail. IMPRESSION: Intraoperative fluoroscopic imaging, as detailed above. Electronically Signed   By: Margaretha Sheffield  MD   On: 10/31/2020 14:39   DG FEMUR PORT MIN 2 VIEWS LEFT  Result Date: 10/31/2020 CLINICAL DATA:  Postop fixation of a mid distal femur fracture. EXAM: LEFT FEMUR PORTABLE 2 VIEWS COMPARISON:  Radiographs, same date. FINDINGS: There is retrograde intramedullary rod in the femur with 2 proximal and 3 distal interlocking screws transfixing the complex comminuted femur fracture which is in good position and alignment. IMPRESSION: Internal fixation and near anatomic alignment of a complex comminuted femur fracture. Electronically Signed   By: Rudie Meyer M.D.   On: 10/31/2020 16:02     DISCHARGE EXAMINATION: Vitals:   11/05/20 2021 11/06/20 0442 11/06/20 0500 11/06/20 0902  BP: (!) 118/92 106/81  112/88  Pulse: (!) 103 95  96  Resp: 16   17  Temp: (!) 97.4 F (36.3 C) 98.9 F (37.2 C)  (!) 97.4 F (36.3 C)  TempSrc: Oral Oral  Oral  SpO2: 99% 97%  100%  Weight:   85.7 kg   Height:       General  appearance: Awake alert.  In no distress Resp: Clear to auscultation bilaterally.  Normal effort Cardio: S1-S2 is normal regular.  No S3-S4.  No rubs murmurs or bruit GI: Abdomen is soft.  Nontender nondistended.  Bowel sounds are present normal.  No masses organomegaly    DISPOSITION: SNF  Discharge Instructions    Call MD for:  difficulty breathing, headache or visual disturbances   Complete by: As directed    Call MD for:  extreme fatigue   Complete by: As directed    Call MD for:  persistant dizziness or light-headedness   Complete by: As directed    Call MD for:  persistant nausea and vomiting   Complete by: As directed    Call MD for:  severe uncontrolled pain   Complete by: As directed    Call MD for:  temperature >100.4   Complete by: As directed    Diet - low sodium heart healthy   Complete by: As directed    Discharge instructions   Complete by: As directed    Please review instructions on the discharge summary.  You were cared for by a hospitalist during your hospital stay. If you have any questions about your discharge medications or the care you received while you were in the hospital after you are discharged, you can call the unit and asked to speak with the hospitalist on call if the hospitalist that took care of you is not available. Once you are discharged, your primary care physician will handle any further medical issues. Please note that NO REFILLS for any discharge medications will be authorized once you are discharged, as it is imperative that you return to your primary care physician (or establish a relationship with a primary care physician if you do not have one) for your aftercare needs so that they can reassess your need for medications and monitor your lab values. If you do not have a primary care physician, you can call [phone] for a physician referral.   Increase activity slowly   Complete by: As directed    No wound care   Complete by: As directed          Allergies as of 11/06/2020   No Known Allergies     Medication List    TAKE these medications   Accu-Chek Softclix Lancets lancets   atorvastatin 20 MG tablet Commonly known as: LIPITOR TAKE 1 TABLET BY MOUTH DAILY AT 6  PM.   dexamethasone 2 MG tablet Commonly known as: DECADRON Take 1 tablet (2 mg total) by mouth daily.   feeding supplement Liqd Take 237 mLs by mouth 2 (two) times daily between meals.   Fifty50 Glucose Meter 2.0 w/Device Kit Use as instructed   HYDROcodone-acetaminophen 5-325 MG tablet Commonly known as: NORCO/VICODIN Take 1 tablet by mouth every 6 (six) hours as needed for severe pain.   insulin lispro 100 UNIT/ML KwikPen Commonly known as: HumaLOG KwikPen Inject 6 Units into the skin 3 (three) times daily. What changed: when to take this   Insupen Ultrafin 31G X 8 MM Misc Generic drug: Insulin Pen Needle   Lantus SoloStar 100 UNIT/ML Solostar Pen Generic drug: insulin glargine Inject 25 Units into the skin 2 (two) times daily. What changed: when to take this   lidocaine-prilocaine cream Commonly known as: EMLA Apply 1 application topically as needed. What changed: reasons to take this   methocarbamol 500 MG tablet Commonly known as: ROBAXIN Take 1 tablet (500 mg total) by mouth every 6 (six) hours as needed for muscle spasms.   midodrine 10 MG tablet Commonly known as: PROAMATINE Take 1 tablet (10 mg total) by mouth 3 (three) times daily with meals.   multivitamin with minerals Tabs tablet Take 1 tablet by mouth daily.   prochlorperazine 10 MG tablet Commonly known as: COMPAZINE Take 10 mg by mouth every 6 (six) hours as needed for nausea or vomiting.   senna 8.6 MG Tabs tablet Commonly known as: SENOKOT Take 1 tablet (8.6 mg total) by mouth 2 (two) times daily.   sennosides-docusate sodium 8.6-50 MG tablet Commonly known as: SENOKOT-S Take 1 tablet by mouth daily as needed for constipation.   sodium chloride 1 g  tablet Take 1 tablet (1 g total) by mouth in the morning.   tamsulosin 0.4 MG Caps capsule Commonly known as: FLOMAX Take 0.4 mg by mouth daily.   Vitamin D3 25 MCG tablet Commonly known as: Vitamin D Take 2 tablets (2,000 Units total) by mouth 2 (two) times daily.   warfarin 7.5 MG tablet Commonly known as: COUMADIN Take as directed. If you are unsure how to take this medication, talk to your nurse or doctor. Original instructions: TAKE 1 TABLET BY MOUTH DAILY AT 6PM. What changed:   how much to take  how to take this  when to take this  additional instructions         Follow-up Information    Bensimhon, Shaune Pascal, MD Follow up on 11/08/2020.   Specialty: Cardiology Why: at 10:20 AM   Contact information: [address] [place] [address] [phone]        Shona Needles, MD. Schedule an appointment as soon as possible for a visit in 2 week(s).   Specialty: Orthopedic Surgery Contact information: [place] [address] [phone]               TOTAL DISCHARGE TIME: 34 minutes  [facility] Pager on [email or web address]  11/06/2020, 10:21 AM

## 2020-11-06 NOTE — Progress Notes (Signed)
ANTICOAGULATION CONSULT NOTE  Pharmacy Consult:  Coumadin Indication: CVA  No Known Allergies  Patient Measurements: Height: 6' (182.9 cm) Weight: 85.7 kg (188 lb 15 oz) IBW/kg (Calculated) : 77.6 Heparin Dosing Weight: 85.3 kg  Vital Signs: Temp: 97.4 F (36.3 C) (01/25 0902) Temp Source: Oral (01/25 0902) BP: 112/88 (01/25 0902) Pulse Rate: 96 (01/25 0902)  Labs: Recent Labs    11/04/20 0350 11/05/20 0320 11/06/20 0508  HGB 9.7*  --   --   HCT 31.5*  --   --   PLT 196  --   --   LABPROT 22.7* 21.7* 25.0*  INR 2.1* 2.0* 2.4*  CREATININE 1.31*  --   --     Estimated Creatinine Clearance: 67.5 mL/min (A) (by C-G formula based on SCr of 1.31 mg/dL (H)).   Assessment: 72 YOM on Coumadin PTA for hx CVA. Admitted with INR 1.9 on PTA dose of 3.75 mg daily EXCEPT for 7.5 mg on Tues (+/- Thurs).  Held and reversed for repair of femur fracture with ortho on 10/31/20. Coumadin resumed on 11/01/20 without a bridge.  INR therapeutic at 2 today; no bleeding reported.  Goal of Therapy:  INR 2-3 Monitor platelets by anticoagulation protocol: Yes   Plan:  Coumadin 3.75mg  PO today - aim to resume home regimen in AM Plans for SNF discharge will order incase still here  Daily PT / INR  Duanne Limerick, PharmD, BCPS,  11/06/2020, 10:39 AM

## 2020-11-07 ENCOUNTER — Other Ambulatory Visit: Payer: Medicare Other

## 2020-11-07 ENCOUNTER — Telehealth: Payer: Self-pay | Admitting: Internal Medicine

## 2020-11-07 ENCOUNTER — Ambulatory Visit: Payer: Medicare Other

## 2020-11-07 ENCOUNTER — Ambulatory Visit: Payer: Medicare Other | Admitting: Internal Medicine

## 2020-11-07 DIAGNOSIS — S72402A Unspecified fracture of lower end of left femur, initial encounter for closed fracture: Secondary | ICD-10-CM | POA: Diagnosis not present

## 2020-11-07 LAB — BASIC METABOLIC PANEL
Anion gap: 7 (ref 5–15)
BUN: 31 mg/dL — ABNORMAL HIGH (ref 6–20)
CO2: 28 mmol/L (ref 22–32)
Calcium: 8.8 mg/dL — ABNORMAL LOW (ref 8.9–10.3)
Chloride: 102 mmol/L (ref 98–111)
Creatinine, Ser: 1.19 mg/dL (ref 0.61–1.24)
GFR, Estimated: >60 mL/min (ref >60)
Glucose, Bld: 261 mg/dL — ABNORMAL HIGH (ref 70–99)
Potassium: 4 mmol/L (ref 3.5–5.1)
Sodium: 137 mmol/L (ref 135–145)

## 2020-11-07 LAB — CBC
HCT: 33.4 % — ABNORMAL LOW (ref 39.0–52.0)
Hemoglobin: 10.1 g/dL — ABNORMAL LOW (ref 13.0–17.0)
MCH: 30.2 pg (ref 26.0–34.0)
MCHC: 30.2 g/dL (ref 30.0–36.0)
MCV: 100 fL (ref 80.0–100.0)
Platelets: 224 10*3/uL (ref 150–400)
RBC: 3.34 MIL/uL — ABNORMAL LOW (ref 4.22–5.81)
RDW: 15.7 % — ABNORMAL HIGH (ref 11.5–15.5)
WBC: 13.9 10*3/uL — ABNORMAL HIGH (ref 4.0–10.5)
nRBC: 0.5 % — ABNORMAL HIGH (ref 0.0–0.2)

## 2020-11-07 LAB — GLUCOSE, CAPILLARY
Glucose-Capillary: 177 mg/dL — ABNORMAL HIGH (ref 70–99)
Glucose-Capillary: 82 mg/dL (ref 70–99)
Glucose-Capillary: 95 mg/dL (ref 70–99)
Glucose-Capillary: 215 mg/dL — ABNORMAL HIGH (ref 70–99)

## 2020-11-07 LAB — PROTIME-INR
INR: 2.3 — ABNORMAL HIGH (ref 0.8–1.2)
Prothrombin Time: 24.7 seconds — ABNORMAL HIGH (ref 11.4–15.2)

## 2020-11-07 MED ORDER — WARFARIN SODIUM 4 MG PO TABS
4.0000 mg | ORAL_TABLET | Freq: Once | ORAL | Status: AC
  Administered 2020-11-07: 4 mg via ORAL
  Filled 2020-11-07: qty 1

## 2020-11-07 MED ORDER — POLYETHYLENE GLYCOL 3350 17 G PO PACK
17.0000 g | PACK | Freq: Every day | ORAL | Status: DC
  Administered 2020-11-07 – 2020-11-10 (×4): 17 g via ORAL
  Filled 2020-11-07 (×7): qty 1

## 2020-11-07 MED ORDER — SENNOSIDES-DOCUSATE SODIUM 8.6-50 MG PO TABS
1.0000 | ORAL_TABLET | Freq: Two times a day (BID) | ORAL | Status: DC
  Administered 2020-11-07 – 2020-11-28 (×24): 1 via ORAL
  Filled 2020-11-07 (×38): qty 1

## 2020-11-07 NOTE — Plan of Care (Signed)

## 2020-11-07 NOTE — Telephone Encounter (Signed)
Manuela Schwartz with [facility] is calling to follow up regarding patient having an MRI for evaluation of a lesion on brain stem. Order for MRI has been placed. She states she recently forwarded a message to Dr. Lovena Le requesting approval and to possible have him present during the MRI, as the patient's ICD is not compatible. Please return call to Manuela Schwartz to discuss further.  Phone #: [phone]

## 2020-11-07 NOTE — Progress Notes (Signed)
Occupational Therapy Treatment Patient Details Name: Dennis [name]. MRN: [id number] DOB: Sep 11, 1961 Today's Date: 11/07/2020    History of present illness Pt is a 60 y.o. male admitted 10/30/20 after slipping on ice with fall sustaining L femur fx. S/p L femur IMN 1/19. Pt incidentally tested (+) COVID-19. PMH includes DM, HTN, CVA (2016), CAD, lung CA.   OT comments  Upon arrival pt in bed  soiled in BM upon arrival but did not have nurse call light on for assistance (pt told therapist he'd had a BM but hadn't called for assist). Pt tried wiping himself with tissues and placing them in urinal and also on bed under him. OT assisted pt with peri hygiene in bed and standing at RW. Pt pleasant and cooperative and and seated in recliner chair with alarm in place at end of session  Follow Up Recommendations  SNF    Equipment Recommendations  Other (comment) (TBD at next venue of care)    Recommendations for Other Services      Precautions / Restrictions Precautions Precautions: Fall Restrictions Weight Bearing Restrictions: Yes LLE Weight Bearing: Weight bearing as tolerated       Mobility Bed Mobility Overal bed mobility: Needs Assistance Bed Mobility: Supine to Sit     Supine to sit: Min assist     General bed mobility comments: min A to elevate trunk usnig rail  Transfers Overall transfer level: Needs assistance Equipment used: Rolling walker (2 wheeled) Transfers: Sit to/from Stand Sit to Stand: Mod assist;Min assist         General transfer comment: Multiple sit<>stands from EOB and recliner to RW, initial modA to assist trunk elevation and stabilize RW; additional trials from recliner progressing to minA, heavy reliance on UE support pushing up from armrests; repeated cues for sequencing, technique and hand placement    Balance Overall balance assessment: Needs assistance Sitting-balance support: Feet supported;No upper extremity supported Sitting balance-Leahy  Scale: Fair     Standing balance support: Bilateral upper extremity supported;During functional activity Standing balance-Leahy Scale: Poor                             ADL either performed or assessed with clinical judgement   ADL       Grooming: Wash/dry hands;Wash/dry face;Sitting;Supervision/safety;Set up   Upper Body Bathing: Set up;Supervision/ safety;Sitting   Lower Body Bathing: Moderate assistance;Sitting/lateral leans           Toilet Transfer: Moderate assistance;Minimal assistance;RW;Stand-pivot;Cueing for safety;Cueing for sequencing   Toileting- Clothing Manipulation and Hygiene: Total assistance       Functional mobility during ADLs: Moderate assistance;Minimal assistance;Rolling walker;Cueing for sequencing;Cueing for safety General ADL Comments: assisted pt with peri hygiene at bed level and when standing at RW     Vision Baseline Vision/History: No visual deficits Patient Visual Report: No change from baseline     Perception     Praxis      Cognition Arousal/Alertness: Awake/alert Behavior During Therapy: WFL for tasks assessed/performed Overall Cognitive Status: No family/caregiver present to determine baseline cognitive functioning                                 General Comments: pt soiled in BM upon arrival but did not have nurse call light on for assistance (pt told therpaist he'd had a BM but hadnlt called for assist). Pt tried wiping himself with tissues  and placing them in urinal and also on bed under him        Exercises     Shoulder Instructions       General Comments      Pertinent Vitals/ Pain       Pain Assessment: Faces Faces Pain Scale: Hurts little more Pain Location: LLE Pain Descriptors / Indicators: Guarding;Discomfort Pain Intervention(s): Monitored during session;Repositioned  Home Living                                          Prior Functioning/Environment               Frequency  Min 2X/week        Progress Toward Goals  OT Goals(current goals can now be found in the care plan section)  Progress towards OT goals: Progressing toward goals  Acute Rehab OT Goals Patient Stated Goal: to go home  Plan Discharge plan remains appropriate    Co-evaluation                 AM-PAC OT "6 Clicks" Daily Activity     Outcome Measure   Help from another person eating meals?: None Help from another person taking care of personal grooming?: A Little Help from another person toileting, which includes using toliet, bedpan, or urinal?: A Lot Help from another person bathing (including washing, rinsing, drying)?: A Lot Help from another person to put on and taking off regular upper body clothing?: A Little Help from another person to put on and taking off regular lower body clothing?: Total 6 Click Score: 15    End of Session Equipment Utilized During Treatment: Gait belt;Rolling walker;Other (comment) (BSC)  OT Visit Diagnosis: Unsteadiness on feet (R26.81);Other abnormalities of gait and mobility (R26.89);History of falling (Z91.81);Pain Pain - Right/Left: Left Pain - part of body: Leg   Activity Tolerance Patient tolerated treatment well   Patient Left in chair;with call bell/phone within reach;with chair alarm set   Nurse Communication Mobility status        Time: 7340-3709 OT Time Calculation (min): 25 min  Charges: OT General Charges $OT Visit: 1 Visit OT Treatments $Self Care/Home Management : 8-22 mins $Therapeutic Activity: 8-22 mins     Britt Bottom 11/07/2020, 4:34 PM

## 2020-11-07 NOTE — Progress Notes (Signed)
[facility] PROGRESS NOTE  Patient: Dennis [name]. FKC:[id number]   PCP: [name], MD DOB: Apr 06, 1961   DOA: 10/30/2020   DOS: 11/07/2020    Subjective: Reports constipation.  Objective:  Vitals:   11/07/20 0343 11/07/20 1225  BP: 95/84 118/71  Pulse: 77 69  Resp: 17   Temp: 98 F (36.7 C) 98.2 F (36.8 C)  SpO2: 93% 98%    Clear to auscultation. S1-S2 present.  Assessment and plan: Constipation. Bowel regimen changed.  Monitor.  Otherwise stable for discharge to SNF.  Author: Berle Mull, MD [facility] 11/07/2020 8:19 PM   If 7PM-7AM, please contact night-coverage at [email or web address]

## 2020-11-07 NOTE — Telephone Encounter (Signed)
Device clinic has already provided response that the patient's ICD is not MRI compatible.  Unclear of what further is needed.  Spoke with Manuela Schwartz.  She states that this patient has had previous MRIs with his ICD in place and the provider has sent an inbasket message to Dr. Lovena Le explaining why this needs to be done again and requesting his clearance/ presence during the MRI.  Advised I would forward to Dr. Forde Dandy nurse for follow-up.  She is requsting a callback/

## 2020-11-07 NOTE — Progress Notes (Signed)
ANTICOAGULATION CONSULT NOTE  Pharmacy Consult:  Coumadin Indication: CVA  No Known Allergies  Patient Measurements: Height: 6' (182.9 cm) Weight: 85.7 kg (188 lb 15 oz) IBW/kg (Calculated) : 77.6 Heparin Dosing Weight: 85.3 kg  Vital Signs: Temp: 98 F (36.7 C) (01/26 0343) BP: 95/84 (01/26 0343) Pulse Rate: 77 (01/26 0343)  Labs: Recent Labs    11/05/20 0320 11/06/20 0508 11/07/20 0355  HGB  --   --  10.1*  HCT  --   --  33.4*  PLT  --   --  224  LABPROT 21.7* 25.0* 24.7*  INR 2.0* 2.4* 2.3*  CREATININE  --   --  1.19    Estimated Creatinine Clearance: 74.3 mL/min (by C-G formula based on SCr of 1.19 mg/dL).   Assessment: 49 YOM on Coumadin PTA for hx CVA. Admitted with INR 1.9 on PTA dose of 3.75 mg daily EXCEPT for 7.5 mg on Tues (+/- Thurs).  Held and reversed for repair of femur fracture with ortho on 10/31/20. Coumadin resumed on 11/01/20 without a bridge.  INR therapeutic at 2.3 today; CBC stable - no bleeding reported.   Goal of Therapy:  INR 2-3 Monitor platelets by anticoagulation protocol: Yes   Plan:  - Repeat Warfarin 4 mg x 1 dose today - Noted stable for d/c to SNF - Daily PT/INR, CBC q72h - Will continue to monitor for any signs/symptoms of bleeding and will follow up with PT/INR in the a.m. (if still here)  Thank you for allowing pharmacy to be a part of this patient's care.  Alycia Rossetti, PharmD, BCPS Clinical Pharmacist Clinical phone for 11/07/2020: [phone] 11/07/2020 10:09 AM   **Pharmacist phone directory can now be found on [email or web address] ([email or web address]).  Listed under [place].

## 2020-11-07 NOTE — Telephone Encounter (Signed)
Message sent to Manuela Schwartz advising ok to proceed with MRI per discussion with Dr. Lovena Le.  Per Dr. Lovena Le have device rep present for MRI.  Await further needs.

## 2020-11-07 NOTE — Progress Notes (Signed)
Physical Therapy Treatment Patient Details Name: Dennis [name]. MRN: [id number] DOB: [birth year] Today's Date: 11/07/2020    History of Present Illness Patient is a 60 y/o male with PMH significant for IDDM, HTN, HLD, CVA in 2016, lung cancer, and CAD on warfarin. Patient had mechanical fall resulting in L femur fx. Patient s/p IM nail L femur on 1/19. Covid+ on precautions as of 10/31/19.    PT Comments    Pt up in chair, agreeable to therapy session with good participation and tolerance for mobility. Primary session focus on seated/supine BLE A/AAROM therapeutic exercises and transfer training. Pt performed therex with good tolerance, needing AA on LLE due to prior CVA and pain post-op, he had most difficult with seated hip flexion. Pt continues to need modA for sit<>stand and stand pivot transfer and has difficulty advancing RLE due to LLE pain/weakness and needs multimodal cues for foot placement and assist to manage RW during transfers. Pt continues to benefit from PT services to progress toward functional mobility goals. Continue to recommend SNF.   Follow Up Recommendations  SNF;Supervision/Assistance - 24 hour     Equipment Recommendations  Rolling walker with 5" wheels    Recommendations for Other Services       Precautions / Restrictions Precautions Precautions: Fall Restrictions Weight Bearing Restrictions: Yes LLE Weight Bearing: Weight bearing as tolerated    Mobility  Bed Mobility Overal bed mobility: Needs Assistance Bed Mobility: Sit to Supine     Supine to sit: Min assist Sit to supine: Min assist   General bed mobility comments: minA for LLE mgmt, use of bed features, HOB flat  Transfers Overall transfer level: Needs assistance Equipment used: Rolling walker (2 wheeled) Transfers: Sit to/from Stand Sit to Stand: Mod assist         General transfer comment: cues for hand placement, increased time to extend trunk  Ambulation/Gait Ambulation/Gait  assistance: Mod assist Gait Distance (Feet): 3 Feet Assistive device: Rolling walker (2 wheeled) Gait Pattern/deviations: Step-to pattern;Decreased stride length;Decreased stance time - left;Antalgic;Decreased step length - right Gait velocity: decreased   General Gait Details: cues and assist for RW management and directions, needs step by step sequencing cues for foot placement   Stairs             Wheelchair Mobility    Modified Rankin (Stroke Patients Only)       Balance Overall balance assessment: Needs assistance Sitting-balance support: Feet supported;No upper extremity supported Sitting balance-Leahy Scale: Fair     Standing balance support: Bilateral upper extremity supported;During functional activity Standing balance-Leahy Scale: Poor Standing balance comment: reliant on UE support                            Cognition Arousal/Alertness: Awake/alert Behavior During Therapy: WFL for tasks assessed/performed Overall Cognitive Status: Within Functional Limits for tasks assessed                                 General Comments: pleasantly cooperative      Exercises Other Exercises Other Exercises: seated BLE A/AAROM (AA on LLE AROM on RLE): LAQ, hip flexion, ankle pumps 1x10 reps ea; supine BLE AROM: hip abduction, heel slides, glute sets, ankle pumps 1x10 reps ea    General Comments General comments (skin integrity, edema, etc.): reviewed importance of pressure offloading techniques to prevent skin breakdown and HEP  Pertinent Vitals/Pain Pain Assessment: Faces Faces Pain Scale: Hurts little more Pain Location: L LE Pain Descriptors / Indicators: Grimacing;Guarding Pain Intervention(s): Monitored during session;Repositioned    Home Living                      Prior Function            PT Goals (current goals can now be found in the care plan section) Acute Rehab PT Goals Patient Stated Goal: to go home PT  Goal Formulation: With patient Time For Goal Achievement: 11/15/20 Potential to Achieve Goals: Fair Progress towards PT goals: Progressing toward goals    Frequency    Min 3X/week      PT Plan Current plan remains appropriate    Co-evaluation              AM-PAC PT "6 Clicks" Mobility   Outcome Measure  Help needed turning from your back to your side while in a flat bed without using bedrails?: A Little Help needed moving from lying on your back to sitting on the side of a flat bed without using bedrails?: A Little Help needed moving to and from a bed to a chair (including a wheelchair)?: A Lot Help needed standing up from a chair using your arms (e.g., wheelchair or bedside chair)?: A Lot Help needed to walk in hospital room?: A Lot Help needed climbing 3-5 steps with a railing? : Total 6 Click Score: 13    End of Session Equipment Utilized During Treatment: Gait belt Activity Tolerance: Patient tolerated treatment well Patient left: with call bell/phone within reach;in bed;with bed alarm set;with SCD's reapplied Nurse Communication: Mobility status (NT notified pt needs new condom cath, previous one had fallen off as pt pivoted to bed) PT Visit Diagnosis: Unsteadiness on feet (R26.81);Other abnormalities of gait and mobility (R26.89);Muscle weakness (generalized) (M62.81);History of falling (Z91.81)     Time: 3662-9476 PT Time Calculation (min) (ACUTE ONLY): 23 min  Charges:  $Therapeutic Exercise: 8-22 mins $Therapeutic Activity: 8-22 mins                     Mayce Noyes P., PTA Acute Rehabilitation Services Pager: [phone] Office: [place] 11/07/2020, 6:22 PM

## 2020-11-08 ENCOUNTER — Encounter (HOSPITAL_COMMUNITY): Payer: Medicare Other | Admitting: Internal Medicine

## 2020-11-08 DIAGNOSIS — S72402A Unspecified fracture of lower end of left femur, initial encounter for closed fracture: Secondary | ICD-10-CM | POA: Diagnosis not present

## 2020-11-08 LAB — GLUCOSE, CAPILLARY
Glucose-Capillary: 190 mg/dL — ABNORMAL HIGH (ref 70–99)
Glucose-Capillary: 120 mg/dL — ABNORMAL HIGH (ref 70–99)
Glucose-Capillary: 134 mg/dL — ABNORMAL HIGH (ref 70–99)
Glucose-Capillary: 123 mg/dL — ABNORMAL HIGH (ref 70–99)

## 2020-11-08 LAB — PROTIME-INR
INR: 2.3 — ABNORMAL HIGH (ref 0.8–1.2)
Prothrombin Time: 24.2 seconds — ABNORMAL HIGH (ref 11.4–15.2)

## 2020-11-08 MED ORDER — LACTULOSE 10 GM/15ML PO SOLN
20.0000 g | Freq: Two times a day (BID) | ORAL | Status: DC
  Administered 2020-11-08 – 2020-11-10 (×6): 20 g via ORAL
  Filled 2020-11-08 (×9): qty 30

## 2020-11-08 MED ORDER — WARFARIN SODIUM 4 MG PO TABS
4.0000 mg | ORAL_TABLET | Freq: Once | ORAL | Status: AC
  Administered 2020-11-08: 4 mg via ORAL
  Filled 2020-11-08: qty 1

## 2020-11-08 NOTE — TOC Progression Note (Signed)
Transition of Care (TOC) - Progression Note    Patient Details  Name: Dennis [name]. MRN: [id number] Date of Birth: Jul 14, 1961  Transition of Care [facility]) CM/SW [place], [place] Phone Number: 11/08/2020, 3:42 PM  Clinical Narrative:     Pt has no bed offers at this time.   Expected Discharge Plan: Skilled Nursing Facility Barriers to Discharge: Continued Medical Work up,No SNF bed  Expected Discharge Plan and Services Expected Discharge Plan: [place] Choice: [place] arrangements for the past 2 months: Single Family Home Expected Discharge Date: 11/06/20                                     Social Determinants of Health (SDOH) Interventions    Readmission Risk Interventions No flowsheet data found.  Emeterio Reeve, Latanya Presser, [place] Social Worker [phone]

## 2020-11-08 NOTE — Progress Notes (Signed)
ANTICOAGULATION CONSULT NOTE  Pharmacy Consult:  Coumadin Indication: CVA  No Known Allergies  Patient Measurements: Height: 6' (182.9 cm) Weight: 85.6 kg (188 lb 11.4 oz) IBW/kg (Calculated) : 77.6 Heparin Dosing Weight: 85.3 kg  Vital Signs: Temp: 98 F (36.7 C) (01/27 0906) Temp Source: Oral (01/27 0906) BP: 99/78 (01/27 0906) Pulse Rate: 96 (01/27 0906)  Labs: Recent Labs    11/06/20 0508 11/07/20 0355 11/08/20 0433  HGB  --  10.1*  --   HCT  --  33.4*  --   PLT  --  224  --   LABPROT 25.0* 24.7* 24.2*  INR 2.4* 2.3* 2.3*  CREATININE  --  1.19  --     Estimated Creatinine Clearance: 74.3 mL/min (by C-G formula based on SCr of 1.19 mg/dL).   Assessment: 40 YOM on Coumadin PTA for hx CVA. Admitted with INR 1.9 on PTA dose of 3.75 mg daily EXCEPT for 7.5 mg on Tues (+/- Thurs).  Held and reversed for repair of femur fracture with ortho on 10/31/20. Coumadin resumed on 11/01/20 without a bridge.  INR therapeutic at 2.3 today; CBC stable - no bleeding reported.   Goal of Therapy:  INR 2-3 Monitor platelets by anticoagulation protocol: Yes   Plan:  - Repeat Warfarin 4 mg x 1 dose today - Noted stable for d/c to SNF - Daily PT/INR, CBC q72h - Will continue to monitor for any signs/symptoms of bleeding and will follow up with PT/INR in the a.m. (if still here)  Thank you for allowing pharmacy to be a part of this patient's care.  Alycia Rossetti, PharmD, BCPS Clinical Pharmacist Clinical phone for 11/08/2020: [phone] 11/08/2020 9:10 AM   **Pharmacist phone directory can now be found on [email or web address] ([email or web address]).  Listed under [place].

## 2020-11-08 NOTE — Progress Notes (Signed)
Nutrition Follow-up  DOCUMENTATION CODES:   Not applicable  INTERVENTION:   -Continue MVI with minerals daily -Continue Glucerna Shake po BID, each supplement provides 220 kcal and 10 grams of protein  NUTRITION DIAGNOSIS:   Increased nutrient needs related to post-op healing as evidenced by estimated needs.  Ongoing  GOAL:   Patient will meet greater than or equal to 90% of their needs  Progressing   MONITOR:   Supplement acceptance,PO intake,I & O's,Weight trends,Labs,Skin  REASON FOR ASSESSMENT:   Consult Hip fracture protocol  ASSESSMENT:   32 YOM with PMH of CAD, CHF, ischemic cardiomyopathy, AICD, DM2, stroke, lung cancer mets to brain. Pt admitted 10/30/20 for fall, x-ray indicated left femur fx. Pt underwent surgery for fx. Pt positive for COVID-19.  1/19- s/p Retrograde intramedullary nailing of left femur fracture  Reviewed I/O's: -300 ml x 24 hours and -4.8 L since admission  UOP: 300 ml x 24 hours  Pt unavailable at time of attempted contact.   Pt remains with good appetite. Noted meal completion 100%. Pt is consuming Glucerna supplements.   Noted pt with no BM since 10/28/20. MD just revised bowel regimen yesterday.   Per TOC notes, pt awaiting SNF placement for discharge.   Medications reviewed and include vitamin D3, decadron, miralax, senokot, and coumadin.   Labs reviewed: CBGS: 82-215 (inpatient orders for glycemic control are 0-20 units insulin aspart TID with meals, 0-5 units insulin aspart daily at bedtime, 3 units insulin aspart TID with meals, and 20 units insulin glargine BID).  Diet Order:   Diet Order            Diet - low sodium heart healthy           Diet Heart Room service appropriate? Yes; Fluid consistency: Thin  Diet effective now                 EDUCATION NEEDS:   No education needs have been identified at this time  Skin:  Skin Assessment: Skin Integrity Issues: Skin Integrity Issues:: Incisions Incisions: Closed  incision on left leg (10/31/20)  Last BM:  10/28/20  Height:   Ht Readings from Last 1 Encounters:  10/31/20 6' (1.829 m)    Weight:   Wt Readings from Last 1 Encounters:  11/08/20 85.6 kg    Ideal Body Weight:  81 kg  BMI:  Body mass index is 25.59 kg/m.  Estimated Nutritional Needs:   Kcal:  2000-2400  Protein:  95-110  Fluid:  >2 L    Loistine Chance, RD, LDN, [place] Registered Dietitian II Certified Diabetes Care and Education Specialist Please refer to [facility] for RD and/or RD on-call/weekend/after hours pager

## 2020-11-08 NOTE — Progress Notes (Signed)
[facility] PROGRESS NOTE  Patient: Dennis [name]. HAF:[id number]   PCP: [name], MD DOB: Aug 16, 1961   DOA: 10/30/2020   DOS: 11/08/2020    Subjective: Still has severe constipation.  Patient reports manual disimpaction.  No nausea no vomiting.  Abdominal pain.  Objective:  Vitals:   11/08/20 1349 11/08/20 2018  BP: 101/84 126/89  Pulse: 83 (!) 102  Resp: 16 18  Temp: 97.7 F (36.5 C)   SpO2: 99% 96%    S1-S2 present.  Bowel sounds present.  No abdominal tenderness.  Assessment and plan: Constipation. Add lactulose. Remains stable for discharge  Author: Berle Mull, MD [facility] 11/08/2020 9:01 PM   If 7PM-7AM, please contact night-coverage at [email or web address]

## 2020-11-08 NOTE — Plan of Care (Signed)

## 2020-11-09 ENCOUNTER — Ambulatory Visit: Payer: Medicare Other | Admitting: Internal Medicine

## 2020-11-09 DIAGNOSIS — S72402A Unspecified fracture of lower end of left femur, initial encounter for closed fracture: Secondary | ICD-10-CM | POA: Diagnosis not present

## 2020-11-09 LAB — GLUCOSE, CAPILLARY
Glucose-Capillary: 87 mg/dL (ref 70–99)
Glucose-Capillary: 110 mg/dL — ABNORMAL HIGH (ref 70–99)
Glucose-Capillary: 112 mg/dL — ABNORMAL HIGH (ref 70–99)
Glucose-Capillary: 186 mg/dL — ABNORMAL HIGH (ref 70–99)

## 2020-11-09 LAB — PROTIME-INR
INR: 2.4 — ABNORMAL HIGH (ref 0.8–1.2)
Prothrombin Time: 25.4 seconds — ABNORMAL HIGH (ref 11.4–15.2)

## 2020-11-09 MED ORDER — MAGNESIUM CITRATE PO SOLN
1.0000 | Freq: Once | ORAL | Status: AC
  Administered 2020-11-09: 1 via ORAL
  Filled 2020-11-09: qty 296

## 2020-11-09 MED ORDER — WARFARIN SODIUM 4 MG PO TABS
4.0000 mg | ORAL_TABLET | Freq: Once | ORAL | Status: AC
  Administered 2020-11-09: 4 mg via ORAL
  Filled 2020-11-09: qty 1

## 2020-11-09 NOTE — Progress Notes (Signed)
[facility] PROGRESS NOTE  Patient: Dennis [name]. ZTA:[id number]   PCP: [name], MD DOB: 04-16-1961   DOA: 10/30/2020   DOS: 11/09/2020    Subjective: Small BM yesterday.  Continues to report severe constipation.  No nausea no vomiting.  Objective:  Vitals:   11/09/20 0400 11/09/20 0900  BP: 117/79 118/80  Pulse: (!) 101 80  Resp: 20   Temp: 97.9 F (36.6 C) 98.7 F (37.1 C)  SpO2: 97%     S1-S2 present.  Bowel sounds present.  No abdominal tenderness.  Assessment and plan: Constipation. Continue current medication. Add mag citrate.  Author: Berle Mull, MD [facility] 11/09/2020 8:47 PM   If 7PM-7AM, please contact night-coverage at [email or web address]

## 2020-11-09 NOTE — Plan of Care (Signed)
  Problem: Safety: Goal: Ability to remain free from injury will improve Outcome: Progressing   

## 2020-11-09 NOTE — Progress Notes (Signed)
Physical Therapy Treatment Patient Details Name: Dennis [name]. MRN: [id number] DOB: December 11, 1960 Today's Date: 11/09/2020    History of Present Illness Patient is a 60 y/o male with PMH significant for IDDM, HTN, HLD, CVA in 2016, lung cancer, and CAD on warfarin. Patient had mechanical fall resulting in L femur fx. Patient s/p IM nail L femur on 1/19. Covid+ on precautions as of 10/31/19.    PT Comments    Pt received in supine, drowsy but agreeable to therapy session with good participation and fair tolerance for mobility. Primary session focus on transfer and pre-gait training at bedside, pt unable to deweight RLE to attempt marching with +2 assist and L knee blocked, able to march LLE only in stance. STS from EOB with +70modA and needed modA for bed mobility and needed HOB partially elevated as well. Pt with noted decreased short term memory needing reminder for safety cues throughout. Pt continues to benefit from PT services to progress toward functional mobility goals. Continue to recommend SNF.  Follow Up Recommendations  SNF;Supervision/Assistance - 24 hour     Equipment Recommendations  Rolling walker with 5" wheels    Recommendations for Other Services       Precautions / Restrictions Precautions Precautions: Fall Restrictions Weight Bearing Restrictions: Yes LLE Weight Bearing: Weight bearing as tolerated    Mobility  Bed Mobility Overal bed mobility: Needs Assistance Bed Mobility: Supine to Sit     Supine to sit: Mod assist;HOB elevated     General bed mobility comments: minA for LLE mgmt, use of bed features, HOB flat, needs modA trunk assist to rise; to L EOB  Transfers Overall transfer level: Needs assistance Equipment used: Rolling walker (2 wheeled) Transfers: Sit to/from Stand Sit to Stand: Mod assist;+2 physical assistance         General transfer comment: cues for hand placement with poor carryover, increased time to extend trunk  Ambulation/Gait              General Gait Details: pt unable to deweight RLE to advance for steps this date, only able to pivot toward chair on R side   Stairs             Wheelchair Mobility    Modified Rankin (Stroke Patients Only)       Balance Overall balance assessment: Needs assistance Sitting-balance support: Feet supported;No upper extremity supported Sitting balance-Leahy Scale: Fair     Standing balance support: Bilateral upper extremity supported;During functional activity Standing balance-Leahy Scale: Poor Standing balance comment: reliant on UE support                            Cognition Arousal/Alertness: Awake/alert Behavior During Therapy: WFL for tasks assessed/performed Overall Cognitive Status: Within Functional Limits for tasks assessed                                 General Comments: pleasantly cooperative, slow processing this date and was observed to be incontinent of bowel but reports he didn't realize      Exercises Other Exercises Other Exercises: standing LLE hip flexion AROM x10 reps (unable to march RLE)    General Comments General comments (skin integrity, edema, etc.): pt incontinent of bowels but seemed unaware, totalA for peri-care in stance      Pertinent Vitals/Pain Pain Assessment: Faces Faces Pain Scale: Hurts a little bit Pain Location: L  LE Pain Descriptors / Indicators: Grimacing;Guarding Pain Intervention(s): Monitored during session;Repositioned    Home Living                      Prior Function            PT Goals (current goals can now be found in the care plan section) Acute Rehab PT Goals Patient Stated Goal: to go home PT Goal Formulation: With patient Time For Goal Achievement: 11/15/20 Potential to Achieve Goals: Fair Progress towards PT goals: Progressing toward goals (slow progress)    Frequency    Min 3X/week      PT Plan Current plan remains appropriate     Co-evaluation              AM-PAC PT "6 Clicks" Mobility   Outcome Measure  Help needed turning from your back to your side while in a flat bed without using bedrails?: A Little Help needed moving from lying on your back to sitting on the side of a flat bed without using bedrails?: A Lot Help needed moving to and from a bed to a chair (including a wheelchair)?: A Lot Help needed standing up from a chair using your arms (e.g., wheelchair or bedside chair)?: A Lot Help needed to walk in hospital room?: Total Help needed climbing 3-5 steps with a railing? : Total 6 Click Score: 11    End of Session Equipment Utilized During Treatment: Gait belt Activity Tolerance: Patient tolerated treatment well Patient left: with call bell/phone within reach;in chair Nurse Communication: Mobility status (NT notified pt up in chair) PT Visit Diagnosis: Unsteadiness on feet (R26.81);Other abnormalities of gait and mobility (R26.89);Muscle weakness (generalized) (M62.81);History of falling (Z91.81)     Time: 7209-4709 PT Time Calculation (min) (ACUTE ONLY): 18 min  Charges:  $Therapeutic Activity: 8-22 mins                     Marjarie Irion P., PTA Acute Rehabilitation Services Pager: [phone] Office: [place] 11/09/2020, 3:47 PM

## 2020-11-09 NOTE — Plan of Care (Signed)

## 2020-11-09 NOTE — Progress Notes (Signed)
ANTICOAGULATION CONSULT NOTE  Pharmacy Consult:  Coumadin Indication: CVA  No Known Allergies  Patient Measurements: Height: 6' (182.9 cm) Weight: 85.6 kg (188 lb 11.4 oz) IBW/kg (Calculated) : 77.6 Heparin Dosing Weight: 85.3 kg  Vital Signs: Temp: 97.9 F (36.6 C) (01/28 0400) Temp Source: Oral (01/28 0400) BP: 117/79 (01/28 0400) Pulse Rate: 101 (01/28 0400)  Labs: Recent Labs    11/07/20 0355 11/08/20 0433 11/09/20 0358  HGB 10.1*  --   --   HCT 33.4*  --   --   PLT 224  --   --   LABPROT 24.7* 24.2* 25.4*  INR 2.3* 2.3* 2.4*  CREATININE 1.19  --   --     Estimated Creatinine Clearance: 74.3 mL/min (by C-G formula based on SCr of 1.19 mg/dL).   Assessment: 30 YOM on Coumadin PTA for hx CVA. Admitted with INR 1.9 on PTA dose of 3.75 mg daily EXCEPT for 7.5 mg on Tues (+/- Thurs).  Held and reversed for repair of femur fracture with ortho on 10/31/20. Coumadin resumed on 11/01/20 without a bridge.  INR therapeutic at 2.3 today; CBC stable - no bleeding reported.   Goal of Therapy:  INR 2-3 Monitor platelets by anticoagulation protocol: Yes   Plan:  - Repeat Warfarin 4 mg x 1 dose today - Noted stable for d/c to SNF - Daily PT/INR, CBC q72h - Will continue to monitor for any signs/symptoms of bleeding and will follow up with PT/INR in the a.m. (if still here)  Thank you for allowing pharmacy to be a part of this patient's care.  Alycia Rossetti, PharmD, BCPS Clinical Pharmacist Clinical phone for 11/09/2020: [phone] 11/09/2020 8:03 AM   **Pharmacist phone directory can now be found on [email or web address] ([email or web address]).  Listed under [place].

## 2020-11-09 NOTE — Care Management Important Message (Signed)
Important Message  Patient Details  Name: Dennis [name]. MRN: [id number] Date of Birth: 1961/01/27   Medicare Important Message Given:  Yes - Important Message mailed due to current National Emergency   Verbal consent obtained due to current National Emergency  Relationship to patient: Self Contact Name: Dareon Nunziato Call Date: 11/09/20  Time: 1356 Phone: [phone] Outcome: No Answer/Busy Important Message mailed to: Patient address on file    Delorse Lek 11/09/2020, 1:57 PM

## 2020-11-10 DIAGNOSIS — S72402A Unspecified fracture of lower end of left femur, initial encounter for closed fracture: Secondary | ICD-10-CM | POA: Diagnosis not present

## 2020-11-10 LAB — GLUCOSE, CAPILLARY
Glucose-Capillary: 156 mg/dL — ABNORMAL HIGH (ref 70–99)
Glucose-Capillary: 177 mg/dL — ABNORMAL HIGH (ref 70–99)
Glucose-Capillary: 53 mg/dL — ABNORMAL LOW (ref 70–99)
Glucose-Capillary: 67 mg/dL — ABNORMAL LOW (ref 70–99)
Glucose-Capillary: 91 mg/dL (ref 70–99)
Glucose-Capillary: 199 mg/dL — ABNORMAL HIGH (ref 70–99)

## 2020-11-10 LAB — PROTIME-INR
INR: 2.2 — ABNORMAL HIGH (ref 0.8–1.2)
Prothrombin Time: 23.5 seconds — ABNORMAL HIGH (ref 11.4–15.2)

## 2020-11-10 MED ORDER — INSULIN ASPART 100 UNIT/ML ~~LOC~~ SOLN: 0.0000 [IU] | Freq: Every day | SUBCUTANEOUS | Status: DC

## 2020-11-10 MED ORDER — WARFARIN SODIUM 4 MG PO TABS
4.0000 mg | ORAL_TABLET | Freq: Once | ORAL | Status: AC
  Administered 2020-11-10: 4 mg via ORAL
  Filled 2020-11-10: qty 1

## 2020-11-10 MED ORDER — INSULIN ASPART 100 UNIT/ML ~~LOC~~ SOLN
0.0000 [IU] | Freq: Three times a day (TID) | SUBCUTANEOUS | Status: DC
  Administered 2020-11-11: 5 [IU] via SUBCUTANEOUS
  Administered 2020-11-11: 2 [IU] via SUBCUTANEOUS
  Administered 2020-11-12: 3 [IU] via SUBCUTANEOUS
  Administered 2020-11-12: 5 [IU] via SUBCUTANEOUS

## 2020-11-10 NOTE — Progress Notes (Signed)
Recheck of blood glucose was improved with 67.  The patient was given Ensure to drink and will recheck in 30 minutes.

## 2020-11-10 NOTE — Progress Notes (Signed)
[facility] PROGRESS NOTE  Patient: Dennis [name]. [id number]:[id number]   PCP: [name], MD DOB: 09-Aug-1961   DOA: 10/30/2020   DOS: 11/10/2020    Subjective: No nausea no vomiting.  Continues to have complaints of constipation but actually having bowel movement right when I am in the room right now.  No insomnia.  No chest pain.  Objective:  Vitals:   11/10/20 0300 11/10/20 1420  BP: 115/75 121/78  Pulse: 99 98  Resp: 17 18  Temp: 98 F (36.7 C) 97.8 F (36.6 C)  SpO2: 100% 98%    S1-S2 present. Clear to auscultation  Bowel sounds present.  No abdominal tenderness.  Assessment and plan: Hypoglycemia. Adjust insulin regimen.  Discontinue Lantus and continue with sliding scale insulin from resistant to sensitive scale.  Constipation. Appears to have resolved.  Monitor.  COVID-19. Isolation removed.  Author: Berle Mull, MD [facility] 11/10/2020 6:06 PM   If 7PM-7AM, please contact night-coverage at [email or web address]

## 2020-11-10 NOTE — Progress Notes (Signed)
Dr Posey Pronto was given update on the patient's fingerstick glucose and will recheck in 30 minutes

## 2020-11-10 NOTE — Plan of Care (Signed)
  Problem: Education: Goal: Knowledge of General Education information will improve Description Including pain rating scale, medication(s)/side effects and non-pharmacologic comfort measures Outcome: Progressing   

## 2020-11-10 NOTE — TOC Progression Note (Signed)
Transition of Care (TOC) - Progression Note    Patient Details  Name: Dennis [name]. MRN: [id number] Date of Birth: 09-09-1961  Transition of Care [facility]) CM/SW [place], [place] Phone Number: 11/10/2020, 2:28 PM  Clinical Narrative:     Patient has no bed offers.  TOC will continue to follow and assist with discharge planning.  Thurmond Butts, MSW, LCSW Clinical Social Worker   Expected Discharge Plan: Skilled Nursing Facility Barriers to Discharge: Continued Medical Work up,No SNF bed  Expected Discharge Plan and Services Expected Discharge Plan: [place]: Center arrangements for the past 2 months: Single Family Home Expected Discharge Date: 11/06/20                                     Social Determinants of Health (SDOH) Interventions    Readmission Risk Interventions No flowsheet data found.

## 2020-11-10 NOTE — Plan of Care (Signed)

## 2020-11-10 NOTE — Progress Notes (Signed)
The patient had an abnormal fingerstick glucose of 53- The patient denied and did not appear to have any signs or symptoms of low blood glucose.  The patient was given ensure max and blood glucose will be checked in 30 minutes.

## 2020-11-10 NOTE — Progress Notes (Signed)
ANTICOAGULATION CONSULT NOTE  Pharmacy Consult:  Coumadin Indication: CVA  No Known Allergies  Patient Measurements: Height: 6' (182.9 cm) Weight: 85.6 kg (188 lb 11.4 oz) IBW/kg (Calculated) : 77.6 Heparin Dosing Weight: 85.3 kg  Vital Signs: Temp: 98 F (36.7 C) (01/29 0300) Temp Source: Oral (01/29 0300) BP: 115/75 (01/29 0300) Pulse Rate: 99 (01/29 0300)  Labs: Recent Labs    11/08/20 0433 11/09/20 0358 11/10/20 0415  LABPROT 24.2* 25.4* 23.5*  INR 2.3* 2.4* 2.2*    Estimated Creatinine Clearance: 74.3 mL/min (by C-G formula based on SCr of 1.19 mg/dL).   Assessment: 81 YOM on Coumadin PTA for hx CVA. Admitted with INR 1.9 on PTA dose of 3.75 mg daily EXCEPT for 7.5 mg on Tues (+/- Thurs).  Held and reversed for repair of femur fracture with ortho on 10/31/20. Coumadin resumed on 11/01/20 without a bridge.  INR therapeutic at 2.2 today; CBC stable - no bleeding reported.   Goal of Therapy:  INR 2-3 Monitor platelets by anticoagulation protocol: Yes   Plan:  - Repeat Warfarin 4 mg x 1 dose today (if patient remains admitted this evening) - Noted stable for d/c to SNF - Daily PT/INR, CBC q72h - Will continue to monitor for any signs/symptoms of bleeding and will follow up with PT/INR in the a.m. (if still here)  Thank you for allowing pharmacy to be a part of this patient's care.  Alfonse Spruce, PharmD PGY2 ID Pharmacy Resident Phone between 7 am - 3:30 pm: [phone]  Please check AMION for all [place] phone numbers After 10:00 PM, call [place] [phone]

## 2020-11-11 DIAGNOSIS — S72402A Unspecified fracture of lower end of left femur, initial encounter for closed fracture: Secondary | ICD-10-CM | POA: Diagnosis not present

## 2020-11-11 LAB — GLUCOSE, CAPILLARY
Glucose-Capillary: 100 mg/dL — ABNORMAL HIGH (ref 70–99)
Glucose-Capillary: 185 mg/dL — ABNORMAL HIGH (ref 70–99)
Glucose-Capillary: 269 mg/dL — ABNORMAL HIGH (ref 70–99)
Glucose-Capillary: 138 mg/dL — ABNORMAL HIGH (ref 70–99)

## 2020-11-11 LAB — PROTIME-INR
INR: 2.2 — ABNORMAL HIGH (ref 0.8–1.2)
Prothrombin Time: 24 seconds — ABNORMAL HIGH (ref 11.4–15.2)

## 2020-11-11 MED ORDER — WARFARIN SODIUM 4 MG PO TABS
4.0000 mg | ORAL_TABLET | Freq: Every day | ORAL | Status: DC
  Administered 2020-11-11 – 2020-11-13 (×3): 4 mg via ORAL
  Filled 2020-11-11 (×3): qty 1

## 2020-11-11 NOTE — Progress Notes (Signed)
[facility] PROGRESS NOTE  Patient: Dennis [name]. GZF:[id number]   PCP: [name], MD DOB: 1961/01/07   DOA: 10/30/2020   DOS: 11/11/2020    Subjective: Constipation resolved.  No nausea no vomiting.  Objective:  Vitals:   11/11/20 0824 11/11/20 1438  BP: 101/81 108/82  Pulse: 98 (!) 101  Resp: 14 15  Temp: 98 F (36.7 C) 97.7 F (36.5 C)  SpO2: 92% 97%   S1-S2 present. Clear to auscultation. Bowel sounds present.  Assessment and plan: Hypoglycemia. Resolved. Monitor.  Constipation. Resolved.  Monitor.  COVID-19. Isolation removed.  Author: Berle Mull, MD [facility] 11/11/2020 7:24 PM   If 7PM-7AM, please contact night-coverage at [email or web address]

## 2020-11-11 NOTE — Plan of Care (Signed)
  Problem: Health Behavior/Discharge Planning: Goal: Ability to manage health-related needs will improve Outcome: Progressing   Problem: Clinical Measurements: Goal: Ability to maintain clinical measurements within normal limits will improve Outcome: Progressing Goal: Will remain free from infection Outcome: Progressing   

## 2020-11-11 NOTE — Progress Notes (Signed)
ANTICOAGULATION CONSULT NOTE  Pharmacy Consult:  Coumadin Indication: CVA  No Known Allergies  Patient Measurements: Height: 6' (182.9 cm) Weight: 84.6 kg (186 lb 8.2 oz) IBW/kg (Calculated) : 77.6 Heparin Dosing Weight: 85.3 kg  Vital Signs: Temp: 97.8 F (36.6 C) (01/30 0450) Temp Source: Oral (01/30 0450) BP: 95/74 (01/30 0450) Pulse Rate: 97 (01/30 0450)  Labs: Recent Labs    11/09/20 0358 11/10/20 0415 11/11/20 0335  LABPROT 25.4* 23.5* 24.0*  INR 2.4* 2.2* 2.2*    Estimated Creatinine Clearance: 74.3 mL/min (by C-G formula based on SCr of 1.19 mg/dL).   Assessment: Dennis [name] on Coumadin PTA for hx CVA. Admitted with INR 1.9 on PTA dose of 3.75 mg daily EXCEPT for 7.5 mg on Tues (+/- Thurs).  Held and reversed for repair of femur fracture with ortho on 10/31/20. Coumadin resumed on 11/01/20 without a bridge.  INR remains therapeutic at 2.2 today; CBC stable - no bleeding reported. As patient's INR and CBC have remained stable, will initiate warfarin 4mg  daily while he remains admitted.  Goal of Therapy:  INR 2-3 Monitor platelets by anticoagulation protocol: Yes   Plan:  - Continue warfarin 4mg  once daily - Noted stable for d/c to SNF - Daily PT/INR, CBC q72h -Pharmacy to monitor peripherally for any signs/symptoms of bleeding and will follow up with PT/INR in the a.m. (if still here)  Thank you for allowing pharmacy to be a part of this patient's care.  Alfonse Spruce, PharmD PGY2 ID Pharmacy Resident Phone between 7 am - 3:30 pm: [phone]  Please check AMION for all [place] phone numbers After 10:00 PM, call [place] [phone]

## 2020-11-12 ENCOUNTER — Other Ambulatory Visit: Payer: Medicare Other

## 2020-11-12 ENCOUNTER — Inpatient Hospital Stay: Payer: Medicare Other

## 2020-11-12 ENCOUNTER — Ambulatory Visit: Payer: Medicare Other | Admitting: Physician Assistant

## 2020-11-12 DIAGNOSIS — S72402A Unspecified fracture of lower end of left femur, initial encounter for closed fracture: Secondary | ICD-10-CM | POA: Diagnosis not present

## 2020-11-12 LAB — CBC
HCT: 31.1 % — ABNORMAL LOW (ref 39.0–52.0)
Hemoglobin: 9.9 g/dL — ABNORMAL LOW (ref 13.0–17.0)
MCH: 32 pg (ref 26.0–34.0)
MCHC: 31.8 g/dL (ref 30.0–36.0)
MCV: 100.6 fL — ABNORMAL HIGH (ref 80.0–100.0)
Platelets: 235 10*3/uL (ref 150–400)
RBC: 3.09 MIL/uL — ABNORMAL LOW (ref 4.22–5.81)
RDW: 16.2 % — ABNORMAL HIGH (ref 11.5–15.5)
WBC: 16 10*3/uL — ABNORMAL HIGH (ref 4.0–10.5)
nRBC: 0 % (ref 0.0–0.2)

## 2020-11-12 LAB — BASIC METABOLIC PANEL
Anion gap: 8 (ref 5–15)
BUN: 26 mg/dL — ABNORMAL HIGH (ref 6–20)
CO2: 26 mmol/L (ref 22–32)
Calcium: 8.5 mg/dL — ABNORMAL LOW (ref 8.9–10.3)
Chloride: 99 mmol/L (ref 98–111)
Creatinine, Ser: 1.39 mg/dL — ABNORMAL HIGH (ref 0.61–1.24)
GFR, Estimated: 59 mL/min — ABNORMAL LOW (ref >60)
Glucose, Bld: 368 mg/dL — ABNORMAL HIGH (ref 70–99)
Potassium: 4.1 mmol/L (ref 3.5–5.1)
Sodium: 133 mmol/L — ABNORMAL LOW (ref 135–145)

## 2020-11-12 LAB — GLUCOSE, CAPILLARY
Glucose-Capillary: 202 mg/dL — ABNORMAL HIGH (ref 70–99)
Glucose-Capillary: 267 mg/dL — ABNORMAL HIGH (ref 70–99)
Glucose-Capillary: 461 mg/dL — ABNORMAL HIGH (ref 70–99)
Glucose-Capillary: 220 mg/dL — ABNORMAL HIGH (ref 70–99)

## 2020-11-12 LAB — PROTIME-INR
INR: 2.3 — ABNORMAL HIGH (ref 0.8–1.2)
Prothrombin Time: 24.7 seconds — ABNORMAL HIGH (ref 11.4–15.2)

## 2020-11-12 MED ORDER — SODIUM CHLORIDE 0.9 % IV BOLUS
1000.0000 mL | Freq: Once | INTRAVENOUS | Status: AC
  Administered 2020-11-12: 1000 mL via INTRAVENOUS

## 2020-11-12 MED ORDER — INSULIN ASPART 100 UNIT/ML ~~LOC~~ SOLN
2.0000 [IU] | Freq: Three times a day (TID) | SUBCUTANEOUS | Status: DC
  Administered 2020-11-13 – 2020-11-28 (×42): 2 [IU] via SUBCUTANEOUS

## 2020-11-12 MED ORDER — INSULIN ASPART 100 UNIT/ML ~~LOC~~ SOLN
20.0000 [IU] | Freq: Once | SUBCUTANEOUS | Status: AC
  Administered 2020-11-12: 20 [IU] via SUBCUTANEOUS

## 2020-11-12 MED ORDER — INSULIN ASPART 100 UNIT/ML ~~LOC~~ SOLN: 22.0000 [IU] | Freq: Once | SUBCUTANEOUS | Status: DC

## 2020-11-12 MED ORDER — INSULIN ASPART 100 UNIT/ML ~~LOC~~ SOLN
0.0000 [IU] | Freq: Every day | SUBCUTANEOUS | Status: DC
  Administered 2020-11-12 – 2020-11-27 (×8): 2 [IU] via SUBCUTANEOUS

## 2020-11-12 MED ORDER — WARFARIN - PHARMACIST DOSING INPATIENT: Freq: Every day | Status: DC

## 2020-11-12 MED ORDER — INSULIN ASPART 100 UNIT/ML ~~LOC~~ SOLN
0.0000 [IU] | Freq: Three times a day (TID) | SUBCUTANEOUS | Status: DC
  Administered 2020-11-13: 15 [IU] via SUBCUTANEOUS
  Administered 2020-11-13 (×2): 8 [IU] via SUBCUTANEOUS
  Administered 2020-11-14 (×2): 5 [IU] via SUBCUTANEOUS
  Administered 2020-11-14 – 2020-11-15 (×2): 8 [IU] via SUBCUTANEOUS
  Administered 2020-11-15 (×2): 3 [IU] via SUBCUTANEOUS
  Administered 2020-11-16 (×3): 2 [IU] via SUBCUTANEOUS
  Administered 2020-11-17 – 2020-11-18 (×3): 3 [IU] via SUBCUTANEOUS
  Administered 2020-11-18: 5 [IU] via SUBCUTANEOUS
  Administered 2020-11-18: 3 [IU] via SUBCUTANEOUS
  Administered 2020-11-19 (×2): 2 [IU] via SUBCUTANEOUS
  Administered 2020-11-20: 3 [IU] via SUBCUTANEOUS
  Administered 2020-11-20: 2 [IU] via SUBCUTANEOUS
  Administered 2020-11-21: 8 [IU] via SUBCUTANEOUS
  Administered 2020-11-21: 5 [IU] via SUBCUTANEOUS
  Administered 2020-11-22 (×2): 2 [IU] via SUBCUTANEOUS
  Administered 2020-11-23: 3 [IU] via SUBCUTANEOUS
  Administered 2020-11-24: 8 [IU] via SUBCUTANEOUS
  Administered 2020-11-25 (×2): 2 [IU] via SUBCUTANEOUS
  Administered 2020-11-26: 3 [IU] via SUBCUTANEOUS
  Administered 2020-11-26: 2 [IU] via SUBCUTANEOUS
  Administered 2020-11-27 – 2020-11-28 (×2): 3 [IU] via SUBCUTANEOUS

## 2020-11-12 MED ORDER — INSULIN GLARGINE 100 UNIT/ML ~~LOC~~ SOLN
15.0000 [IU] | Freq: Two times a day (BID) | SUBCUTANEOUS | Status: DC
  Administered 2020-11-12 – 2020-11-14 (×4): 15 [IU] via SUBCUTANEOUS
  Filled 2020-11-12 (×5): qty 0.15

## 2020-11-12 NOTE — Progress Notes (Signed)
ANTICOAGULATION CONSULT NOTE  Pharmacy Consult:  Coumadin Indication: CVA  No Known Allergies  Patient Measurements: Height: 6' (182.9 cm) Weight: 84 kg (185 lb 3 oz) IBW/kg (Calculated) : 77.6 Heparin Dosing Weight: 85.3 kg  Vital Signs: Temp: 97.8 F (36.6 C) (01/31 0831) Temp Source: Oral (01/31 0831) BP: 103/79 (01/31 0831) Pulse Rate: 98 (01/31 0831)  Labs: Recent Labs    11/10/20 0415 11/11/20 0335 11/12/20 0358  LABPROT 23.5* 24.0* 24.7*  INR 2.2* 2.2* 2.3*    Estimated Creatinine Clearance: 74.3 mL/min (by C-G formula based on SCr of 1.19 mg/dL).   Assessment: 88 YOM on Coumadin PTA for hx CVA. Admitted with INR 1.9 on PTA dose of 3.75 mg daily EXCEPT for 7.5 mg on Tues (+/- Thurs).  Held and reversed for repair of femur fracture with ortho on 10/31/20. Coumadin resumed on 11/01/20 without a bridge.  INR therapeutic at 2.3 today. No reported bleeding. Patient noted stable for discharge - awaiting SNF bed.   Goal of Therapy:  INR 2-3 Monitor platelets by anticoagulation protocol: Yes   Plan:  - Warfarin 4 mg daily  - Monitor daily PT/INR - CBC tomorrow morning  - Will continue to monitor for any signs/symptoms of bleeding   Thank you for allowing pharmacy to be a part of this patient's care.  Cristela Felt, PharmD Clinical Pharmacist

## 2020-11-12 NOTE — Progress Notes (Signed)
Inpatient Diabetes Program Recommendations  AACE/ADA: New Consensus Statement on Inpatient Glycemic Control   Target Ranges:  Prepandial:   less than 140 mg/dL      Peak postprandial:   less than 180 mg/dL (1-2 hours)      Critically ill patients:  140 - 180 mg/dL   Results for [name], [name] (MRN [id number]) as of 11/12/2020 14:53  Ref. Range 11/11/2020 06:41 11/11/2020 11:39 11/11/2020 15:19 11/11/2020 19:42 11/12/2020 06:42 11/12/2020 10:09 11/12/2020 12:24 11/12/2020 13:07  Glucose-Capillary Latest Ref Range: 70 - 99 mg/dL 100 (H) 185 (H)  Novolog 2 units@12 :00 269 (H)  Novolog 5 units@16 :06 138 (H) 202 (H)   Novolog 3 units 267 (H)     Novolog 5 units   Results for [name], [name] (MRN [id number]) as of 11/12/2020 14:53  Ref. Range 11/10/2020 06:47 11/10/2020 09:00 11/10/2020 11:35 11/10/2020 13:00 11/10/2020 15:52 11/10/2020 16:57 11/10/2020 18:09 11/10/2020 22:26  Glucose-Capillary Latest Ref Range: 70 - 99 mg/dL 156 (H)    Novolog 7 units (ate 15%) 177 (H)      Lantus 20 units@11 :00   Novolog 7 units  53 (L) 67 (L) 91 199 (H)   Review of Glycemic Control  Diabetes history: DM2 Outpatient Diabetes medications: Lantus 25 units BID, Humalog 6 units TID with meals Current orders for Inpatient glycemic control: Novolog 0-9 units TID with meals, Novolog 0-5 units QHS; Decadron 2 mg daily  Inpatient Diabetes Program Recommendations:    Insulin: Noted patient was ordered Lantus and last received Lantus 20 units on 11/10/20 at 11:00. Patient had hypoglycemia on 11/10/20 (most likely due to Novolog insulin) and Lantus was discontinued. CBGs today 202 mg/dl and 267 mg/dl. Please consider ordering Lantus 8 units Q24H.  Thanks, Barnie Alderman, RN, MSN, CDE Diabetes Coordinator Inpatient Diabetes Program [phone] (Team Pager from 8am to 5pm)

## 2020-11-12 NOTE — Plan of Care (Signed)
  Problem: Health Behavior/Discharge Planning: Goal: Ability to manage health-related needs will improve Outcome: Progressing   Problem: Clinical Measurements: Goal: Ability to maintain clinical measurements within normal limits will improve Outcome: Progressing   Problem: Nutrition: Goal: Adequate nutrition will be maintained Outcome: Progressing   Problem: Coping: Goal: Level of anxiety will decrease Outcome: Progressing   Problem: Elimination: Goal: Will not experience complications related to bowel motility Outcome: Progressing   Problem: Pain Managment: Goal: General experience of comfort will improve Outcome: Progressing   Problem: Safety: Goal: Ability to remain free from injury will improve Outcome: Progressing   Problem: Skin Integrity: Goal: Risk for impaired skin integrity will decrease Outcome: Progressing   

## 2020-11-12 NOTE — Progress Notes (Signed)
[facility] Progress Note  Patient: Dennis [name].    [id number]:[id number]  DOA: 10/30/2020     Date of Service: the patient was seen and examined on 11/12/2020  Brief hospital course: 59 y.o.malewith medical history significant ofsystolic CHF last EF 44-81%,E/HUDJS, CAD, right MCA CVAin 06/2020,on Coumadin,small cell lung cancer with mets to brain (dx3/2021),DM type II, cocaine abuse, and remote alcohol abuse who presentedafter he slipped on ice outside. Patient was found to have left distal femur fracture.  On 1/19 underwent retrograde intramedullary nailing. Also incidentally Covid positive.  Currently plan is monitor for improvement in renal function, stabilization of glucose level as well as further work-up of his metastatic brain lesion.  Assessment and Plan: Distal left femur fracture Patient underwent intramedullary nail placement on 1/19.   Pain is adequately controlled. Seen by PT and OT.  SNF is recommended.   Follow-up with orthopedics in 2 weeks.  COVID-19 infection acute respiratory failure with hypoxia He had exposure over the weekend prior to admission from his brother and sister-in-law who tested positive for COVID-19. Patient had minimal cough. Chest x-ray raise concern for interstitial prominence concerning for possible pneumonitis or CHF.  BNP was noted to be elevated at 795.  Patient was given 1 dose of IV furosemide.  He was given a 3-day course of Remdesivir. CRP improved. D-dimer stable.  Continue incentive spirometry and mobilization.  Currently off of isolation.  Chronic systolic CHF/AICD in situ/chronic anticoagulation EF noted to be 25 to 30% based on echocardiogram done in September 2021. Seen by cardiology.  No further diuretics at this time.  Volume status is stable.  ICD evaluated by cardiology in December, at [facility] but not EOL. Cardiology recommending to holding off on replacing ICD for now given his lung cancer history. Will discuss with  cardiology regarding need for MRI. Home warfarin was placed on hold due to surgery.  Postop warfarin has been resumed. INR therapeutic.   Will need to be monitored while at SNF.  History of small cell lung cancer with a metastatic brain lesion Patient followed by Dr. Mickeal Skinner and Dr. Julien Nordmann.  Patient underwent whole brain radiation and chemotherapy. Supposed to have repeat MRI brain on January 30.    Back on usual dose of dexamethasone.  Anemia of chronic disease/acute blood loss Drop in hemoglobin could be due to operative losses.  Stable for the most part.  No overt bleeding noted otherwise. Leukocytosis is due to steroids.  Orthostatic hypotension Stable. Continue with midodrine and salt tablets.  Acute kidney injury Creatinine went up to 1.5 after he was given furosemide. Subsequently improved and has been stable. Renal function mildly worsened again likely secondary to poor p.o. intake.  Giving IV fluids and monitor.  Dysphagia Recent speech evaluation revealed mild oropharyngeal dysphagia likely due to his prior stroke as well as brain lesion. Aspiration precautions.  Has been tolerating current diet without difficulty.  Diabetes mellitus type 2 uncontrolled with hyperglycemia HbA1c 8.7 in December.   Elevated CBGs due to higher dose of steroids.  Should keep to his usual as steroid has been tapered down.  Continue his home medication regimen.  Monitor CBGs   History of stroke Patient with right MCA stroke in September 2021. Has residual dysarthria and left-sided weakness. PT and OT evaluation.  History of cocaine abuse Urine drug screen still positive for cocaine.   Diet: Carb modified diet DVT Prophylaxis:   SCDs Start: 10/30/20 1530 warfarin (COUMADIN) tablet 4 mg    Advance goals of  care discussion: Full code  Family Communication: no family was present at bedside, at the time of interview.   Disposition:  Status is: Inpatient  We will  continue with IV fluids and control blood sugar.  While remaining inpatient and waiting for safe discharge to SNF, will get further work-up for his metastatic brain lesion for which he needs a repeat MRI.  Dispo:  Patient From: Home  Planned Disposition: [place]  Expected discharge date: 11/14/2020  Medically stable for discharge: Yes  Difficult to place patient: Yes  Subjective: No nausea no vomiting.  No fever no chills.  Later in the afternoon blood sugars in 400.  Physical Exam:  General: Appear in mild distress, no Rash; Oral Mucosa Clear, moist. no Abnormal Neck Mass Or lumps, Conjunctiva normal  Cardiovascular: S1 and S2 Present, no Murmur, Respiratory: good respiratory effort, Bilateral Air entry present and CTA, no Crackles, no wheezes Abdomen: Bowel Sound present, Soft and no tenderness Extremities: no Pedal edema Neurology: alert and oriented to time, place, and person affect appropriate. no new focal deficit Gait not checked due to patient safety concerns  Vitals:   11/12/20 0831 11/12/20 1500 11/12/20 1652 11/12/20 2059  BP: 103/79 (!) 122/93 109/81 130/81  Pulse: 98 96 99 (!) 107  Resp: 14 17 18 17   Temp: 97.8 F (36.6 C) 98 F (36.7 C) 98.4 F (36.9 C) 98.3 F (36.8 C)  TempSrc: Oral Oral Oral   SpO2: 97% 98% 97% 94%  Weight:      Height:        Intake/Output Summary (Last 24 hours) at 11/12/2020 2107 Last data filed at 11/12/2020 4098 Gross per 24 hour  Intake 1018.3 ml  Output 1101 ml  Net -82.7 ml   Filed Weights   11/08/20 0500 11/11/20 0500 11/12/20 0500  Weight: 85.6 kg 84.6 kg 84 kg    Data Reviewed: I have personally reviewed and interpreted daily labs, tele strips, imaging. I reviewed all nursing notes, pharmacy notes, vitals, pertinent old records I have discussed plan of care as described above with RN and patient/family.  CBC: Recent Labs  Lab 11/07/20 0355 11/12/20 1259  WBC 13.9* 16.0*  HGB 10.1* 9.9*  HCT 33.4*  31.1*  MCV 100.0 100.6*  PLT 224 119   Basic Metabolic Panel: Recent Labs  Lab 11/07/20 0355 11/12/20 1259  NA 137 133*  K 4.0 4.1  CL 102 99  CO2 28 26  GLUCOSE 261* 368*  BUN 31* 26*  CREATININE 1.19 1.39*  CALCIUM 8.8* 8.5*    Studies: No results found.  Scheduled Meds: . atorvastatin  20 mg Oral Daily  . Chlorhexidine Gluconate Cloth  6 each Topical Daily  . cholecalciferol  2,000 Units Oral BID  . dexamethasone  2 mg Oral Daily  . feeding supplement (GLUCERNA SHAKE)  237 mL Oral BID BM  . [START ON 11/13/2020] insulin aspart  0-15 Units Subcutaneous TID WC  . insulin aspart  0-5 Units Subcutaneous QHS  . [START ON 11/13/2020] insulin aspart  2 Units Subcutaneous TID WC  . insulin glargine  15 Units Subcutaneous BID  . lactulose  20 g Oral BID  . midodrine  10 mg Oral TID WC  . multivitamin with minerals  1 tablet Oral Daily  . polyethylene glycol  17 g Oral Daily  . senna-docusate  1 tablet Oral BID  . sodium chloride flush  10-40 mL Intracatheter Q12H  . sodium chloride  1 g Oral q AM  .  tamsulosin  0.4 mg Oral Daily  . warfarin  4 mg Oral q1600  . Warfarin - Pharmacist Dosing Inpatient   Does not apply q1600   Continuous Infusions: PRN Meds: acetaminophen, albuterol, chlorpheniramine-HYDROcodone, guaiFENesin-dextromethorphan, HYDROcodone-acetaminophen, lidocaine-prilocaine, methocarbamol, sodium chloride flush  Time spent: 35 minutes  Author: Berle Mull, MD [facility] 11/12/2020 9:07 PM  To reach On-call, see care teams to locate the attending and reach out via [email or web address]. Between 7PM-7AM, please contact night-coverage If you still have difficulty reaching the attending provider, please page the [facility] (Director on Call) for [facility] on amion for assistance.

## 2020-11-12 NOTE — Progress Notes (Signed)
Physical Therapy Treatment Patient Details Name: Dennis [name]. MRN: [id number] DOB: [birth year] Today's Date: 11/12/2020    History of Present Illness Patient is a 60 y/o male with PMH significant for IDDM, HTN, HLD, CVA in 2016, lung cancer, and CAD on warfarin. Patient had mechanical fall resulting in L femur fx. Patient s/p IM nail L femur on 1/19. Covid+ on precautions as of 10/31/19; off prec as of 1/31    PT Comments    Continuing work on functional mobility and activity tolerance;  Session focused on further gait training for coordination, efficiency of steps, and gently encouraging increased weight bearing LLE in stance; making slow, but notable progress;  Anticipate continuing good progress at post-acute rehabilitation.    Follow Up Recommendations  SNF;Supervision/Assistance - 24 hour     Equipment Recommendations  Rolling walker with 5" wheels    Recommendations for Other Services       Precautions / Restrictions Precautions Precautions: Fall Restrictions LLE Weight Bearing: Weight bearing as tolerated    Mobility  Bed Mobility Overal bed mobility: Needs Assistance Bed Mobility: Supine to Sit     Supine to sit: Mod assist;HOB elevated     General bed mobility comments: minA for LLE mgmt, use of bed features, HOB flat, needs modA trunk assist to rise; to L EOB  Transfers Overall transfer level: Needs assistance Equipment used: Rolling walker (2 wheeled) Transfers: Sit to/from Stand Sit to Stand: Mod assist         General transfer comment: Cues fro hand placement and safety; light mod assist to rise and steady  Ambulation/Gait Ambulation/Gait assistance: Min assist;+2 safety/equipment Gait Distance (Feet): 8 Feet Assistive device: Rolling walker (2 wheeled) Gait Pattern/deviations: Step-to pattern;Decreased stride length;Decreased stance time - left;Antalgic;Decreased step length - right Gait velocity: decreased   General Gait Details: Cues for step  sequence and to use RW to unweigh painful LLE in stance; still with difficulty unweighing LLE, leading to more pain in stance and decr R step length; encouragement to incr amb distance; incontinent of bowel during amb   Stairs             Wheelchair Mobility    Modified Rankin (Stroke Patients Only)       Balance     Sitting balance-Leahy Scale: Fair       Standing balance-Leahy Scale: Poor                              Cognition Arousal/Alertness: Awake/alert Behavior During Therapy: WFL for tasks assessed/performed Overall Cognitive Status: Within Functional Limits for tasks assessed                                 General Comments: pleasantly cooperative, slow processing this date and was observed to be incontinent of bowel but reports he didn't realize      Exercises General Exercises - Lower Extremity Quad Sets: AROM;Left;10 reps Other Exercises Other Exercises: Heel slides AAROM LLE x5    General Comments        Pertinent Vitals/Pain Pain Assessment: Faces Faces Pain Scale: Hurts little more Pain Location: L LE in standing and with weight bearing Pain Descriptors / Indicators: Grimacing;Guarding Pain Intervention(s): Monitored during session    Home Living                      Prior Function  PT Goals (current goals can now be found in the care plan section) Acute Rehab PT Goals Patient Stated Goal: to go home PT Goal Formulation: With patient Time For Goal Achievement: 11/15/20 Potential to Achieve Goals: Fair Progress towards PT goals: Progressing toward goals    Frequency    Min 3X/week      PT Plan Current plan remains appropriate    Co-evaluation              AM-PAC PT "6 Clicks" Mobility   Outcome Measure  Help needed turning from your back to your side while in a flat bed without using bedrails?: A Little Help needed moving from lying on your back to sitting on the side  of a flat bed without using bedrails?: A Lot Help needed moving to and from a bed to a chair (including a wheelchair)?: A Lot Help needed standing up from a chair using your arms (e.g., wheelchair or bedside chair)?: A Lot Help needed to walk in hospital room?: A Lot Help needed climbing 3-5 steps with a railing? : Total 6 Click Score: 12    End of Session Equipment Utilized During Treatment: Gait belt Activity Tolerance: Patient tolerated treatment well Patient left: in chair;with call bell/phone within reach;with chair alarm set Nurse Communication: Mobility status PT Visit Diagnosis: Unsteadiness on feet (R26.81);Other abnormalities of gait and mobility (R26.89);Muscle weakness (generalized) (M62.81);History of falling (Z91.81)     Time: 1441-1500 PT Time Calculation (min) (ACUTE ONLY): 19 min  Charges:  $Gait Training: 8-22 mins                     Roney Marion, [place]  Acute Rehabilitation Services Pager [phone] Office [place] 11/12/2020, 7:35 PM

## 2020-11-13 ENCOUNTER — Other Ambulatory Visit: Payer: Self-pay | Admitting: Radiation Therapy

## 2020-11-13 ENCOUNTER — Inpatient Hospital Stay (HOSPITAL_COMMUNITY): Payer: Medicare Other

## 2020-11-13 DIAGNOSIS — S72402A Unspecified fracture of lower end of left femur, initial encounter for closed fracture: Secondary | ICD-10-CM | POA: Diagnosis not present

## 2020-11-13 LAB — CBC
HCT: 30.7 % — ABNORMAL LOW (ref 39.0–52.0)
Hemoglobin: 9.2 g/dL — ABNORMAL LOW (ref 13.0–17.0)
MCH: 30.7 pg (ref 26.0–34.0)
MCHC: 30 g/dL (ref 30.0–36.0)
MCV: 102.3 fL — ABNORMAL HIGH (ref 80.0–100.0)
Platelets: 223 10*3/uL (ref 150–400)
RBC: 3 MIL/uL — ABNORMAL LOW (ref 4.22–5.81)
RDW: 15.9 % — ABNORMAL HIGH (ref 11.5–15.5)
WBC: 12.7 10*3/uL — ABNORMAL HIGH (ref 4.0–10.5)
nRBC: 0 % (ref 0.0–0.2)

## 2020-11-13 LAB — BASIC METABOLIC PANEL
Anion gap: 7 (ref 5–15)
BUN: 31 mg/dL — ABNORMAL HIGH (ref 6–20)
CO2: 26 mmol/L (ref 22–32)
Calcium: 8.7 mg/dL — ABNORMAL LOW (ref 8.9–10.3)
Chloride: 103 mmol/L (ref 98–111)
Creatinine, Ser: 1.39 mg/dL — ABNORMAL HIGH (ref 0.61–1.24)
GFR, Estimated: 59 mL/min — ABNORMAL LOW (ref >60)
Glucose, Bld: 297 mg/dL — ABNORMAL HIGH (ref 70–99)
Potassium: 4.2 mmol/L (ref 3.5–5.1)
Sodium: 136 mmol/L (ref 135–145)

## 2020-11-13 LAB — GLUCOSE, CAPILLARY
Glucose-Capillary: 358 mg/dL — ABNORMAL HIGH (ref 70–99)
Glucose-Capillary: 256 mg/dL — ABNORMAL HIGH (ref 70–99)
Glucose-Capillary: 263 mg/dL — ABNORMAL HIGH (ref 70–99)
Glucose-Capillary: 217 mg/dL — ABNORMAL HIGH (ref 70–99)

## 2020-11-13 LAB — PROTIME-INR
INR: 2.5 — ABNORMAL HIGH (ref 0.8–1.2)
Prothrombin Time: 26 seconds — ABNORMAL HIGH (ref 11.4–15.2)

## 2020-11-13 IMAGING — DX DG FEMUR 2+V*L*
4 series · 4 of 4 positions shown · non-contrast
Comparison: [DATE].

CLINICAL DATA: Status post open reduction internal fixation of left
femur fracture.

EXAM:
LEFT FEMUR 2 VIEWS

[femur ap (1 of 2)]
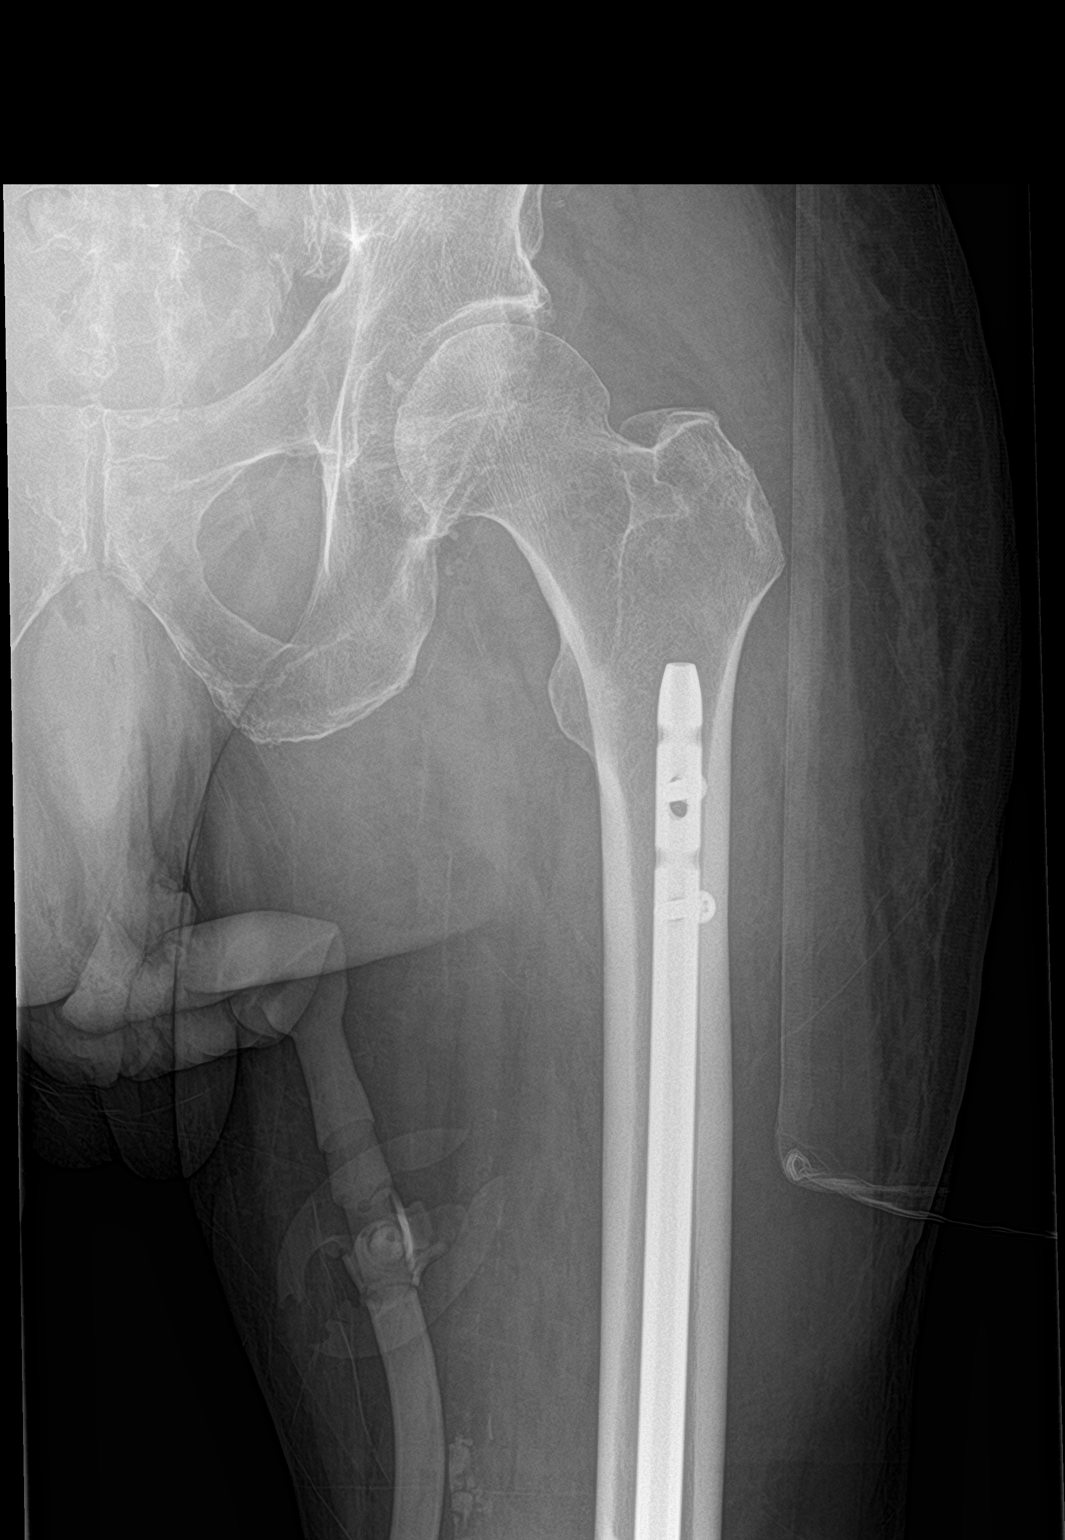

[femur ap (2 of 2)]
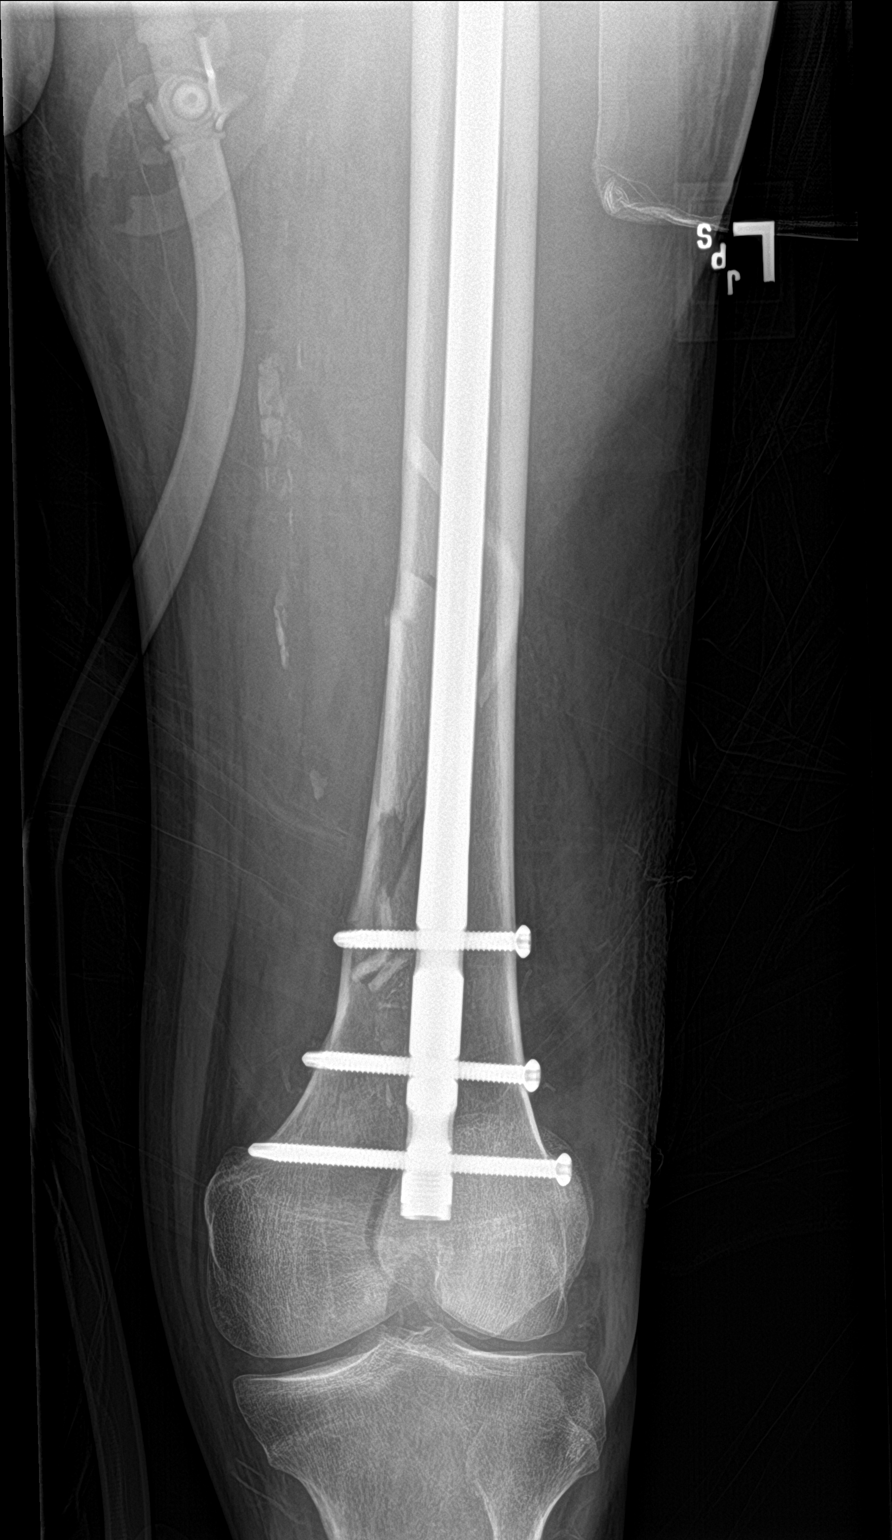

[femur lat]
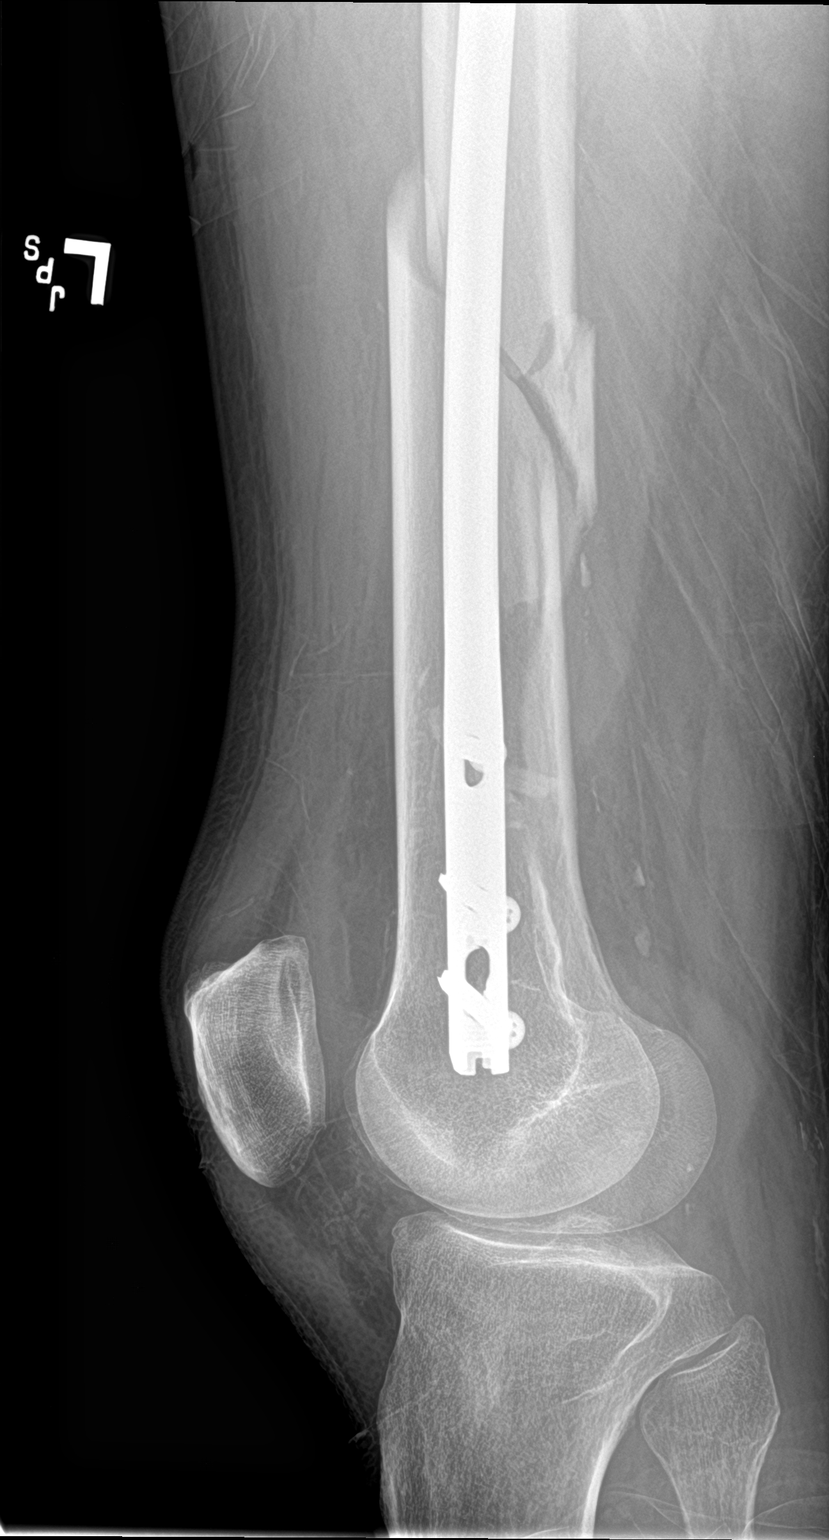

[hip x-table]
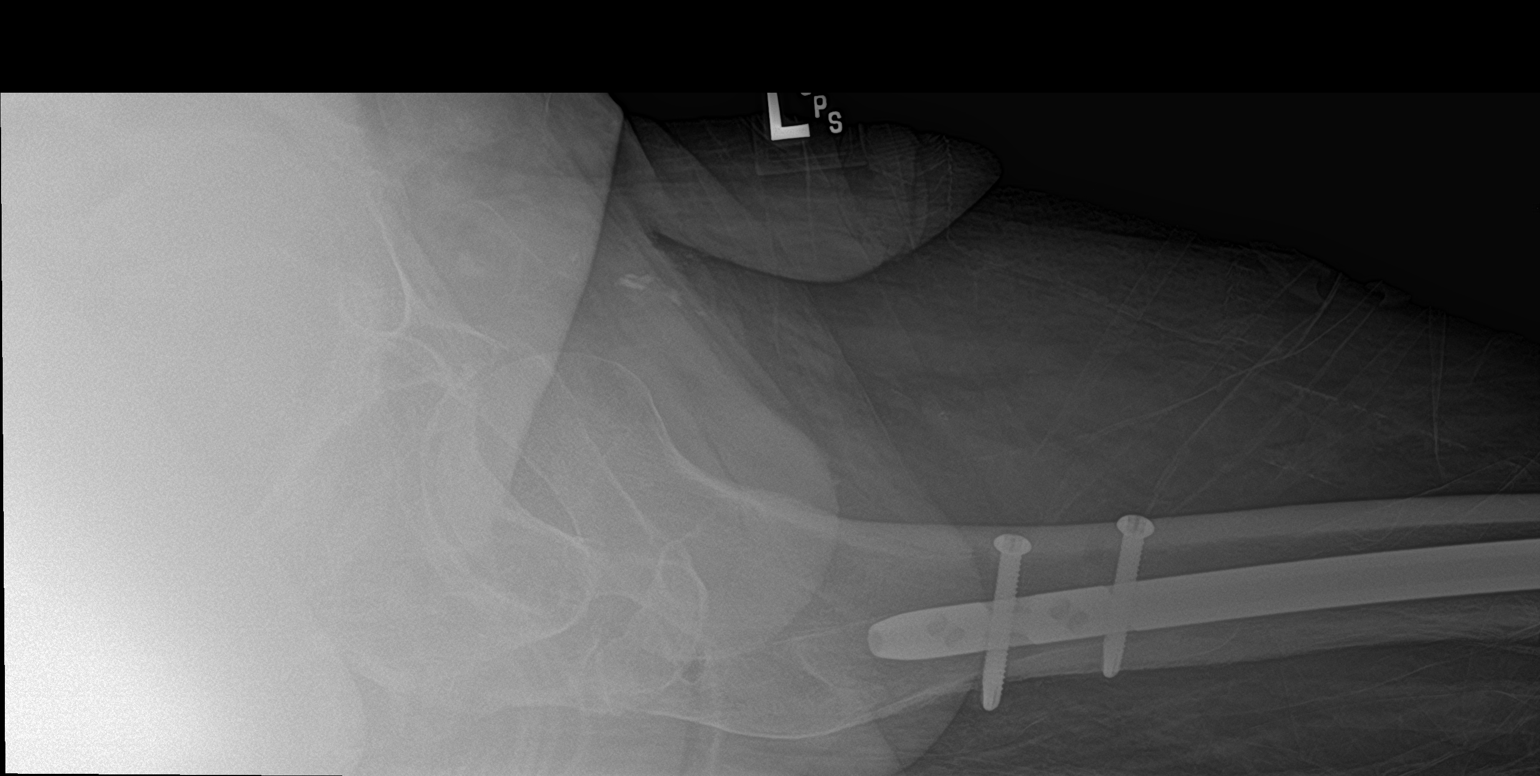

[4 of 4 positions shown; findings below may reference images not displayed]

FINDINGS: Status post intramedullary rod fixation of comminuted distal left
femoral fracture. Good alignment of fracture components is noted.
IMPRESSION: Status post intramedullary rod fixation of comminuted distal left
femoral fracture.

## 2020-11-13 MED ORDER — LACTATED RINGERS IV SOLN: INTRAVENOUS | Status: DC

## 2020-11-13 NOTE — Plan of Care (Signed)
  Problem: Education: Goal: Knowledge of General Education information will improve Description: Including pain rating scale, medication(s)/side effects and non-pharmacologic comfort measures Outcome: Progressing   Problem: Clinical Measurements: Goal: Respiratory complications will improve Outcome: Progressing   Problem: Activity: Goal: Risk for activity intolerance will decrease Outcome: Progressing   Problem: Nutrition: Goal: Adequate nutrition will be maintained Outcome: Progressing   Problem: Coping: Goal: Level of anxiety will decrease Outcome: Progressing   Problem: Elimination: Goal: Will not experience complications related to urinary retention Outcome: Progressing   Problem: Pain Managment: Goal: General experience of comfort will improve Outcome: Progressing

## 2020-11-13 NOTE — Progress Notes (Signed)
[facility] Progress Note  Patient: Dennis [name].    [id number]  DOA: 10/30/2020     Date of Service: the patient was seen and examined on 11/13/2020  Brief hospital course: 60 y.o.malewith medical history significant ofsystolic CHF last EF 56-43%,P/IRJJO, CAD, right MCA CVAin 06/2020,on Coumadin,small cell lung cancer with mets to brain (dx3/2021),DM type II, cocaine abuse, and remote alcohol abuse who presentedafter he slipped on ice outside. Patient was found to have left distal femur fracture.  On 1/19 underwent retrograde intramedullary nailing. Also incidentally Covid positive.  Currently plan is monitor for improvement in renal function, MRI is scheduled with ICD rep tomorrow.  Assessment and Plan: Distal left femur fracture Patient underwent intramedullary nail placement on 1/19.   Pain is adequately controlled. Seen by PT and OT.  SNF is recommended.   Follow-up with orthopedics in 2 weeks.  COVID-19 infection acute respiratory failure with hypoxia He had exposure over the weekend prior to admission from his brother and sister-in-law who tested positive for COVID-19. Patient had minimal cough. Chest x-ray raise concern for interstitial prominence concerning for possible pneumonitis or CHF.  BNP was noted to be elevated at 795.  Patient was given 1 dose of IV furosemide.  He was given a 3-day course of Remdesivir. CRP improved. D-dimer stable.  Continue incentive spirometry and mobilization.  Currently off of isolation.  Chronic systolic CHF/AICD in situ/chronic anticoagulation EF noted to be 25 to 30% based on echocardiogram done in September 2021. Seen by cardiology.  No further diuretics at this time.  Volume status is stable.  ICD evaluated by cardiology in December, at [facility] but not EOL. Cardiology recommending to holding off on replacing ICD for now given his lung cancer history. Will discuss with cardiology regarding need for MRI. Home warfarin  was placed on hold due to surgery.  Postop warfarin has been resumed. INR therapeutic.   Will need to be monitored while at SNF.  History of small cell lung cancer with a metastatic brain lesion Patient followed by Dr. Mickeal Skinner and Dr. Julien Nordmann.  Patient underwent whole brain radiation and chemotherapy. Supposed to have repeat MRI brain on January 30.    Back on usual dose of dexamethasone. Discussed with cardiology as well as neuro-oncology.  MRI scheduled for 2/2 as long as renal function is stable with and without contrast.  ICD rep will be available for MRI.  Anemia of chronic disease/acute blood loss Drop in hemoglobin could be due to operative losses.  Stable for the most part.  No overt bleeding noted otherwise. Leukocytosis is due to steroids.  Orthostatic hypotension Stable. Continue with midodrine and salt tablets.  Acute kidney injury Creatinine went up to 1.5 after he was given furosemide. Subsequently improved and has been stable. Renal function mildly worsened again likely secondary to poor p.o. intake.  Giving IV fluids and monitor.  Dysphagia Recent speech evaluation revealed mild oropharyngeal dysphagia likely due to his prior stroke as well as brain lesion. Aspiration precautions.  Has been tolerating current diet without difficulty.  Diabetes mellitus type 2 uncontrolled with hyperglycemia HbA1c 8.7 in December.   Elevated CBGs due to higher dose of steroids.  Should keep to his usual as steroid has been tapered down.  Continue his home medication regimen.  Monitor CBGs   History of stroke Patient with right MCA stroke in September 2021. Has residual dysarthria and left-sided weakness. PT and OT evaluation.  History of cocaine abuse Urine drug screen still positive for cocaine.  Diet: Carb modified diet DVT Prophylaxis:   SCDs Start: 10/30/20 1530 warfarin (COUMADIN) tablet 4 mg    Advance goals of care discussion: Full code  Family  Communication: no family was present at bedside, at the time of interview.   Disposition:  Status is: Inpatient  Monitor for safe discharge.  Need MRI.  Dispo:  Patient From: Home  Planned Disposition: [place]  Expected discharge date: 11/15/2020  Medically stable for discharge: Yes  Difficult to place patient: Yes  Subjective: No nausea no vomiting but no fever no chills.  Physical Exam:  General: Appear in mild distress, no Rash; Oral Mucosa Clear, moist. no Abnormal Neck Mass Or lumps, Conjunctiva normal  Cardiovascular: S1 and S2 Present, no Murmur, Respiratory: Normal respiratory effort, Bilateral Air entry present and normal crackles, no wheezes Abdomen: Bowel Sound present, Soft and no tenderness Extremities: trace Pedal edema Neurology: alert and oriented to time, place, and person affect appropriate. no new focal deficit Gait not checked due to patient safety concerns    Vitals:   11/13/20 0336 11/13/20 0500 11/13/20 0822 11/13/20 1600  BP: 128/79  113/85 116/86  Pulse: 90  100 (!) 101  Resp: 17  16 16   Temp: 98.3 F (36.8 C)  98.1 F (36.7 C) 98.2 F (36.8 C)  TempSrc: Oral  Oral Oral  SpO2: 96%  96% 100%  Weight:  85.1 kg    Height:        Intake/Output Summary (Last 24 hours) at 11/13/2020 2000 Last data filed at 11/13/2020 1600 Gross per 24 hour  Intake 1290.52 ml  Output 550 ml  Net 740.52 ml   Filed Weights   11/11/20 0500 11/12/20 0500 11/13/20 0500  Weight: 84.6 kg 84 kg 85.1 kg    Data Reviewed: I have personally reviewed and interpreted daily labs, tele strips, imaging. I reviewed all nursing notes, pharmacy notes, vitals, pertinent old records I have discussed plan of care as described above with RN and patient/family.  CBC: Recent Labs  Lab 11/07/20 0355 11/12/20 1259 11/13/20 0301  WBC 13.9* 16.0* 12.7*  HGB 10.1* 9.9* 9.2*  HCT 33.4* 31.1* 30.7*  MCV 100.0 100.6* 102.3*  PLT 224 235 643   Basic Metabolic  Panel: Recent Labs  Lab 11/07/20 0355 11/12/20 1259 11/13/20 0301  NA 137 133* 136  K 4.0 4.1 4.2  CL 102 99 103  CO2 28 26 26   GLUCOSE 261* 368* 297*  BUN 31* 26* 31*  CREATININE 1.19 1.39* 1.39*  CALCIUM 8.8* 8.5* 8.7*    Studies: DG FEMUR MIN 2 VIEWS LEFT  Result Date: 11/13/2020 CLINICAL DATA:  Status post open reduction internal fixation of left femur fracture. EXAM: LEFT FEMUR 2 VIEWS COMPARISON:  October 31, 2020. FINDINGS: Status post intramedullary rod fixation of comminuted distal left femoral fracture. Good alignment of fracture components is noted. IMPRESSION: Status post intramedullary rod fixation of comminuted distal left femoral fracture. Electronically Signed   By: Marijo Conception M.D.   On: 11/13/2020 09:01    Scheduled Meds: . atorvastatin  20 mg Oral Daily  . Chlorhexidine Gluconate Cloth  6 each Topical Daily  . cholecalciferol  2,000 Units Oral BID  . dexamethasone  2 mg Oral Daily  . feeding supplement (GLUCERNA SHAKE)  237 mL Oral BID BM  . insulin aspart  0-15 Units Subcutaneous TID WC  . insulin aspart  0-5 Units Subcutaneous QHS  . insulin aspart  2 Units Subcutaneous TID WC  . insulin  glargine  15 Units Subcutaneous BID  . midodrine  10 mg Oral TID WC  . multivitamin with minerals  1 tablet Oral Daily  . polyethylene glycol  17 g Oral Daily  . senna-docusate  1 tablet Oral BID  . sodium chloride flush  10-40 mL Intracatheter Q12H  . sodium chloride  1 g Oral q AM  . tamsulosin  0.4 mg Oral Daily  . warfarin  4 mg Oral q1600  . Warfarin - Pharmacist Dosing Inpatient   Does not apply q1600   Continuous Infusions: . lactated ringers 125 mL/hr at 11/13/20 1925   PRN Meds: acetaminophen, albuterol, chlorpheniramine-HYDROcodone, guaiFENesin-dextromethorphan, HYDROcodone-acetaminophen, lidocaine-prilocaine, methocarbamol, sodium chloride flush  Time spent: 35 minutes  Author: Berle Mull, MD [facility] 11/13/2020 8:00 PM  To reach  On-call, see care teams to locate the attending and reach out via [email or web address]. Between 7PM-7AM, please contact night-coverage If you still have difficulty reaching the attending provider, please page the [facility] (Director on Call) for [facility] on amion for assistance.

## 2020-11-13 NOTE — Progress Notes (Signed)
Physical Therapy Treatment Patient Details Name: Dennis [name]. MRN: [id number] DOB: [birth year] Today's Date: 11/13/2020    History of Present Illness Patient is a 60 y/o male with PMH significant for IDDM, HTN, HLD, CVA in 2016, lung cancer, and CAD on warfarin. Patient had mechanical fall resulting in L femur fx. Patient s/p IM nail L femur on 1/19. Covid+ on precautions as of 10/31/19; off prec as of 1/31    PT Comments    Continuing work on functional mobility and activity tolerance;  Notable improvements in weight acceptance LLE with better R step length; Small improvements in gait distance as well; Overall progressing well; Anticipate continuing good progress at post-acute rehabilitation.     Follow Up Recommendations  SNF;Supervision/Assistance - 24 hour     Equipment Recommendations  Rolling walker with 5" wheels    Recommendations for Other Services       Precautions / Restrictions Precautions Precautions: Fall Restrictions Weight Bearing Restrictions: Yes LLE Weight Bearing: Weight bearing as tolerated    Mobility  Bed Mobility Overal bed mobility: Needs Assistance Bed Mobility: Supine to Sit     Supine to sit: Min assist;HOB elevated     General bed mobility comments: minA for L LE mgt  Transfers Overall transfer level: Needs assistance Equipment used: Rolling walker (2 wheeled) Transfers: Sit to/from Stand Sit to Stand: Mod assist         General transfer comment: Cues for hand placement and safety; light mod assist to rise and steady  Ambulation/Gait Ambulation/Gait assistance: Min assist Gait Distance (Feet): 15 Feet Assistive device: Rolling walker (2 wheeled) Gait Pattern/deviations: Step-through pattern (slightly emerging)     General Gait Details: Cues for step sequence and to use RW to unweigh painful LLE in stance; still with difficulty unweighing LLE, leading to more pain in stance and decr R step length, though improving; seemed pleased  to increase amb distance   Stairs             Wheelchair Mobility    Modified Rankin (Stroke Patients Only)       Balance Overall balance assessment: Needs assistance Sitting-balance support: Feet supported;No upper extremity supported Sitting balance-Leahy Scale: Fair     Standing balance support: Bilateral upper extremity supported;During functional activity Standing balance-Leahy Scale:  (approaching fair) Standing balance comment: reliant on UE support                            Cognition Arousal/Alertness: Awake/alert Behavior During Therapy: WFL for tasks assessed/performed Overall Cognitive Status: Within Functional Limits for tasks assessed                                 General Comments: pleasantly cooperative, slow processing this date and was observed to be incontinent of bowel but reports he didn't realize      Exercises      General Comments        Pertinent Vitals/Pain Pain Assessment: Faces Faces Pain Scale: Hurts little more Pain Location: L LE in standing and with weight bearing Pain Descriptors / Indicators: Grimacing;Guarding Pain Intervention(s): Monitored during session    Home Living                      Prior Function            PT Goals (current goals can now be found in  the care plan section) Acute Rehab PT Goals Patient Stated Goal: to go home PT Goal Formulation: With patient Time For Goal Achievement: 11/15/20 Potential to Achieve Goals: Fair Progress towards PT goals: Progressing toward goals    Frequency    Min 3X/week      PT Plan Current plan remains appropriate    Co-evaluation              AM-PAC PT "6 Clicks" Mobility   Outcome Measure  Help needed turning from your back to your side while in a flat bed without using bedrails?: A Little Help needed moving from lying on your back to sitting on the side of a flat bed without using bedrails?: A Lot Help needed  moving to and from a bed to a chair (including a wheelchair)?: A Little Help needed standing up from a chair using your arms (e.g., wheelchair or bedside chair)?: A Lot Help needed to walk in hospital room?: A Little Help needed climbing 3-5 steps with a railing? : A Lot 6 Click Score: 15    End of Session Equipment Utilized During Treatment: Gait belt Activity Tolerance: Patient tolerated treatment well Patient left: in chair;with call bell/phone within reach;with chair alarm set Nurse Communication: Mobility status PT Visit Diagnosis: Unsteadiness on feet (R26.81);Other abnormalities of gait and mobility (R26.89);Muscle weakness (generalized) (M62.81);History of falling (Z91.81)     Time: 2334-3568 PT Time Calculation (min) (ACUTE ONLY): 22 min  Charges:  $Gait Training: 8-22 mins                     Roney Marion, [place]  Acute Rehabilitation Services Pager [phone] Office [place] 11/13/2020, 5:56 PM

## 2020-11-13 NOTE — Progress Notes (Signed)
Occupational Therapy Treatment Patient Details Name: Dennis [name]. MRN: [id number] DOB: 1961/06/13 Today's Date: 11/13/2020    History of present illness Patient is a 60 y/o male with PMH significant for IDDM, HTN, HLD, CVA in 2016, lung cancer, and CAD on warfarin. Patient had mechanical fall resulting in L femur fx. Patient s/p IM nail L femur on 1/19. Covid+ on precautions as of 10/31/19; off prec as of 1/31   OT comments  Pt making slow, but good progress with functional goals. OT will continue to follow acutely to maximize level of function and safety  Follow Up Recommendations  SNF    Equipment Recommendations  Other (comment);3 in 1 bedside commode (RW, TBD at [facility])    Recommendations for Other Services      Precautions / Restrictions Precautions Precautions: Fall Restrictions Weight Bearing Restrictions: Yes LLE Weight Bearing: Weight bearing as tolerated       Mobility Bed Mobility Overal bed mobility: Needs Assistance Bed Mobility: Supine to Sit     Supine to sit: Min assist;HOB elevated     General bed mobility comments: minA for L LE mgt  Transfers Overall transfer level: Needs assistance Equipment used: Rolling walker (2 wheeled) Transfers: Sit to/from Stand Sit to Stand: Mod assist         General transfer comment: Cues for hand placement and safety. Pt stood at [facility] for therapist to assist him with posterior peri hygiene    Balance Overall balance assessment: Needs assistance Sitting-balance support: Feet supported;No upper extremity supported Sitting balance-Leahy Scale: Fair     Standing balance support: Bilateral upper extremity supported;During functional activity Standing balance-Leahy Scale: Poor                             ADL either performed or assessed with clinical judgement   ADL Overall ADL's : Needs assistance/impaired     Grooming: Wash/dry hands;Wash/dry face;Supervision/safety;Min guard;Standing   Upper Body  Bathing: Set up;Sitting Upper Body Bathing Details (indicate cue type and reason): simulated Lower Body Bathing: Moderate assistance;Sitting/lateral leans Lower Body Bathing Details (indicate cue type and reason): simulated Upper Body Dressing : Set up;Sitting       Toilet Transfer: Moderate assistance;Minimal assistance;RW;Stand-pivot;Cueing for safety;Cueing for sequencing;BSC   Toileting- Clothing Manipulation and Hygiene: Maximal assistance;Sit to/from stand       Functional mobility during ADLs: Moderate assistance;Minimal assistance;Rolling walker;Cueing for sequencing;Cueing for safety General ADL Comments: assisted pt with peri hygiene  standing at RW     Vision Baseline Vision/History: No visual deficits Patient Visual Report: No change from baseline     Perception     Praxis      Cognition Arousal/Alertness: Awake/alert Behavior During Therapy: WFL for tasks assessed/performed Overall Cognitive Status: Within Functional Limits for tasks assessed                                 General Comments: pleasantly cooperative, slow processing this date and was observed to be incontinent of bowel but reports he didn't realize        Exercises     Shoulder Instructions       General Comments      Pertinent Vitals/ Pain       Pain Assessment: Faces Faces Pain Scale: Hurts little more Pain Location: L LE in standing and with weight bearing Pain Descriptors / Indicators: Grimacing;Guarding Pain Intervention(s): Monitored during  session;Repositioned  Home Living                                          Prior Functioning/Environment              Frequency  Min 2X/week        Progress Toward Goals  OT Goals(current goals can now be found in the care plan section)  Progress towards OT goals: Progressing toward goals     Plan Discharge plan remains appropriate    Co-evaluation                 AM-PAC OT "6  Clicks" Daily Activity     Outcome Measure   Help from another person eating meals?: None Help from another person taking care of personal grooming?: A Little Help from another person toileting, which includes using toliet, bedpan, or urinal?: A Lot Help from another person bathing (including washing, rinsing, drying)?: A Lot Help from another person to put on and taking off regular upper body clothing?: A Little Help from another person to put on and taking off regular lower body clothing?: A Lot 6 Click Score: 16    End of Session Equipment Utilized During Treatment: Gait belt;Rolling walker;Other (comment) (BSC)  OT Visit Diagnosis: Unsteadiness on feet (R26.81);Other abnormalities of gait and mobility (R26.89);History of falling (Z91.81);Pain Pain - Right/Left: Left Pain - part of body: Leg   Activity Tolerance Patient tolerated treatment well   Patient Left in chair;with call bell/phone within reach   Nurse Communication          Time: 1202-1236 OT Time Calculation (min): 34 min  Charges: OT General Charges $OT Visit: 1 Visit OT Treatments $Self Care/Home Management : 8-22 mins $Therapeutic Activity: 8-22 mins     Britt Bottom 11/13/2020, 2:49 PM

## 2020-11-13 NOTE — Progress Notes (Signed)
He states he had his wallet out in the bed to lookup a number-now he can't find- I looked through the linen-on the floor around the bed- the garbage bags and the linen bag- unable to locate- later found underneath pt

## 2020-11-13 NOTE — Progress Notes (Signed)
ANTICOAGULATION CONSULT NOTE  Pharmacy Consult:  Coumadin Indication: CVA  No Known Allergies  Patient Measurements: Height: 6' (182.9 cm) Weight: 85.1 kg (187 lb 9.8 oz) IBW/kg (Calculated) : 77.6 Heparin Dosing Weight: 85.3 kg  Vital Signs: Temp: 98.3 F (36.8 C) (02/01 0336) Temp Source: Oral (02/01 0336) BP: 128/79 (02/01 0336) Pulse Rate: 90 (02/01 0336)  Labs: Recent Labs    11/11/20 0335 11/12/20 0358 11/12/20 1259 11/13/20 0301  HGB  --   --  9.9* 9.2*  HCT  --   --  31.1* 30.7*  PLT  --   --  235 223  LABPROT 24.0* 24.7*  --  26.0*  INR 2.2* 2.3*  --  2.5*  CREATININE  --   --  1.39* 1.39*    Estimated Creatinine Clearance: 63.6 mL/min (A) (by C-G formula based on SCr of 1.39 mg/dL (H)).   Assessment: 69 YOM on Coumadin PTA for hx CVA. Admitted with INR 1.9 on PTA dose of 3.75 mg daily EXCEPT for 7.5 mg on Tues (+/- Thurs).  Held and reversed for repair of femur fracture with ortho on 10/31/20. Coumadin resumed on 11/01/20 without a bridge.  INR therapeutic at 2.5 today. No reported bleeding. Hgb 9.2. Plt wnl.   Goal of Therapy:  INR 2-3 Monitor platelets by anticoagulation protocol: Yes   Plan:  - Warfarin 4 mg daily - Monitor daily PT/INR and CBC - Will continue to monitor for any signs/symptoms of bleeding   Thank you for allowing pharmacy to be a part of this patient's care.  Cristela Felt, PharmD Clinical Pharmacist

## 2020-11-13 NOTE — Care Management Important Message (Signed)
Important Message  Patient Details  Name: Dennis [name]. MRN: [id number] Date of Birth: 08-Jul-1961   Medicare Important Message Given:  Yes - Important Message mailed due to current National Emergency  Verbal consent obtained due to current National Emergency  Relationship to patient: Self Contact Name: Javen Hinderliter Call Date: 11/09/20  Time: 1356 Phone: [phone] Outcome: No Answer/Busy Important Message mailed to: Patient address on file    Delorse Lek 11/13/2020, 2:48 PM

## 2020-11-14 ENCOUNTER — Inpatient Hospital Stay (HOSPITAL_COMMUNITY): Payer: Medicare Other

## 2020-11-14 DIAGNOSIS — C7931 Secondary malignant neoplasm of brain: Secondary | ICD-10-CM | POA: Diagnosis not present

## 2020-11-14 DIAGNOSIS — F1410 Cocaine abuse, uncomplicated: Secondary | ICD-10-CM | POA: Diagnosis not present

## 2020-11-14 DIAGNOSIS — S72402A Unspecified fracture of lower end of left femur, initial encounter for closed fracture: Secondary | ICD-10-CM | POA: Diagnosis not present

## 2020-11-14 DIAGNOSIS — Z95810 Presence of automatic (implantable) cardiac defibrillator: Secondary | ICD-10-CM | POA: Diagnosis not present

## 2020-11-14 LAB — CBC
HCT: 28.9 % — ABNORMAL LOW (ref 39.0–52.0)
Hemoglobin: 9 g/dL — ABNORMAL LOW (ref 13.0–17.0)
MCH: 31.5 pg (ref 26.0–34.0)
MCHC: 31.1 g/dL (ref 30.0–36.0)
MCV: 101 fL — ABNORMAL HIGH (ref 80.0–100.0)
Platelets: 234 10*3/uL (ref 150–400)
RBC: 2.86 MIL/uL — ABNORMAL LOW (ref 4.22–5.81)
RDW: 15.8 % — ABNORMAL HIGH (ref 11.5–15.5)
WBC: 13.6 10*3/uL — ABNORMAL HIGH (ref 4.0–10.5)
nRBC: 0.1 % (ref 0.0–0.2)

## 2020-11-14 LAB — GLUCOSE, CAPILLARY
Glucose-Capillary: 221 mg/dL — ABNORMAL HIGH (ref 70–99)
Glucose-Capillary: 200 mg/dL — ABNORMAL HIGH (ref 70–99)
Glucose-Capillary: 289 mg/dL — ABNORMAL HIGH (ref 70–99)
Glucose-Capillary: 236 mg/dL — ABNORMAL HIGH (ref 70–99)

## 2020-11-14 LAB — PROTIME-INR
INR: 2.6 — ABNORMAL HIGH (ref 0.8–1.2)
Prothrombin Time: 27 seconds — ABNORMAL HIGH (ref 11.4–15.2)

## 2020-11-14 LAB — BASIC METABOLIC PANEL
Anion gap: 6 (ref 5–15)
BUN: 22 mg/dL — ABNORMAL HIGH (ref 6–20)
CO2: 26 mmol/L (ref 22–32)
Calcium: 8.9 mg/dL (ref 8.9–10.3)
Chloride: 104 mmol/L (ref 98–111)
Creatinine, Ser: 1.12 mg/dL (ref 0.61–1.24)
GFR, Estimated: >60 mL/min (ref >60)
Glucose, Bld: 267 mg/dL — ABNORMAL HIGH (ref 70–99)
Potassium: 4.1 mmol/L (ref 3.5–5.1)
Sodium: 136 mmol/L (ref 135–145)

## 2020-11-14 IMAGING — MR MR HEAD WO/W CM
13 series · 41 of 48 positions shown · IV contrast (gadavist)
Comparison: MRI [DATE].

CLINICAL DATA: Brain mass or lesion.  Lung cancer.

EXAM:
MRI HEAD WITHOUT AND WITH CONTRAST
TECHNIQUE: Multiplanar, multiecho pulse sequences of the brain and surrounding
structures were obtained without and with intravenous contrast.
CONTRAST:  8.5mL GADAVIST GADOBUTROL 1 MMOL/ML IV SOLN

[Series 9: FLAIR · sagittal · 3.0mm · 0.78mm/px · 2 of 42 slices shown (1 of 3)]
[im 1/42]
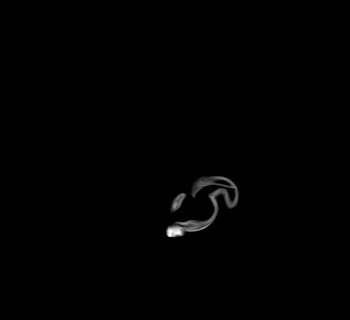
[im 42/42]
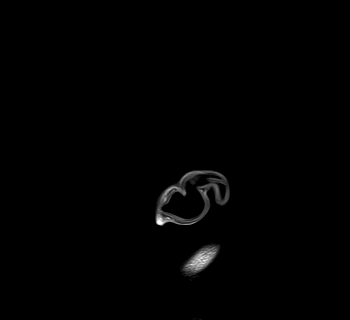

[Series 10: DWI · axial · 3.0mm · 0.96mm/px · z∈[-109,+47]mm · 5 of 106 slices shown (1 of 2)]
[im 1/106]
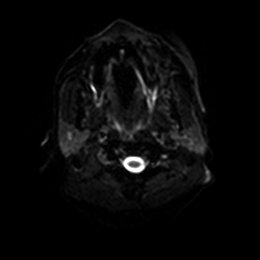
[im 27/106]
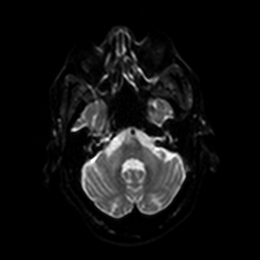
[im 53/106]
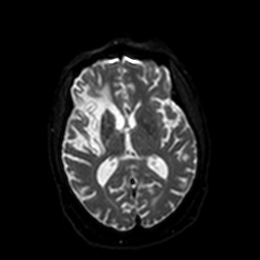
[im 79/106]
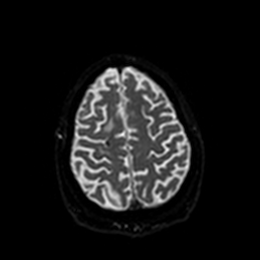
[im 106/106]
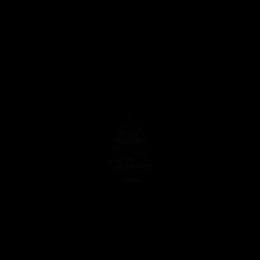

[Series 11: DWI · axial · 3.0mm · 0.96mm/px · z∈[-109,+47]mm · 3 of 53 slices shown (2 of 2)]
[im 1/53]
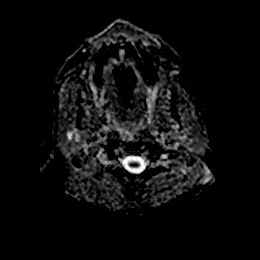
[im 27/53]
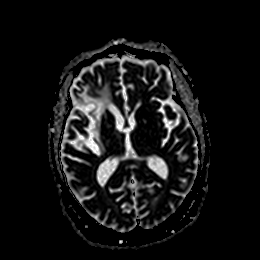
[im 53/53]
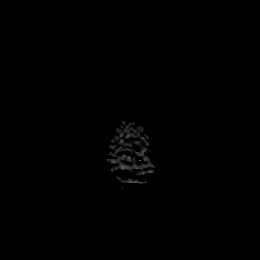

[Series 12: FLAIR · axial · 2.0mm · 0.51mm/px · z∈[-125,+55]mm · 4 of 76 slices shown (2 of 3)]
[im 1/76]
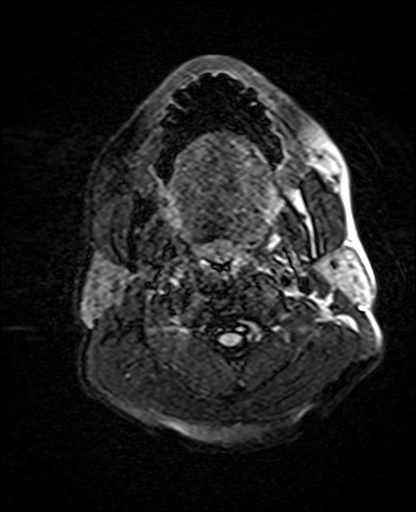
[im 26/76]
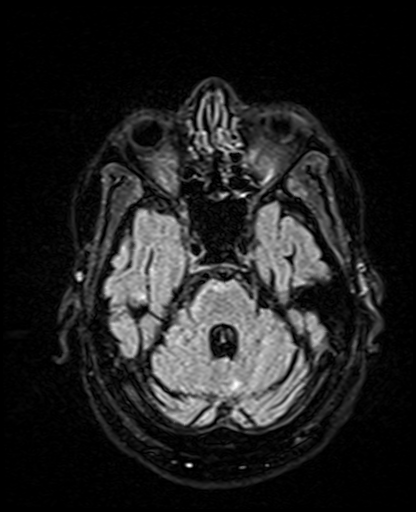
[im 51/76]
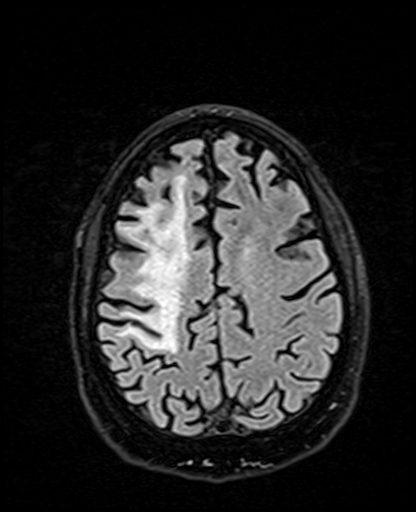
[im 76/76]
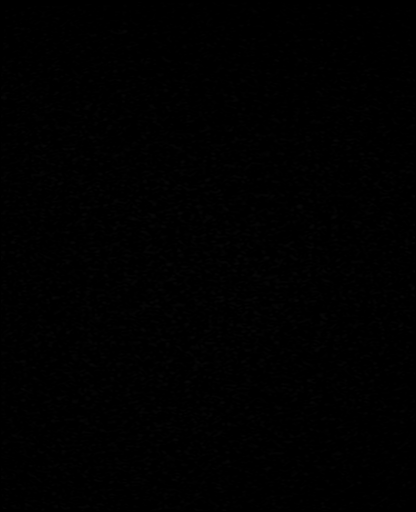

[Series 13: mag_images · axial · 3.0mm · 1.02mm/px · z∈[-124,+53]mm · 3 of 60 slices shown]
[im 1/60]
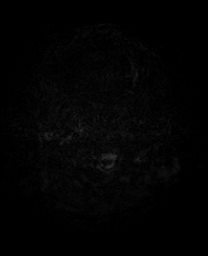
[im 30/60]
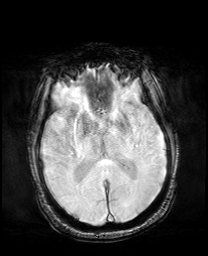
[im 60/60]
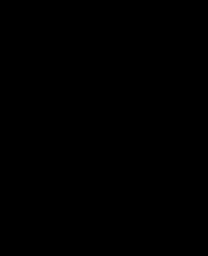

[Series 14: pha_images · axial · 3.0mm · 1.02mm/px · z∈[-124,+44]mm · 3 of 57 slices shown]
[im 1/57]
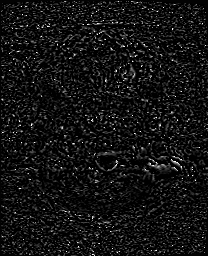
[im 29/57]
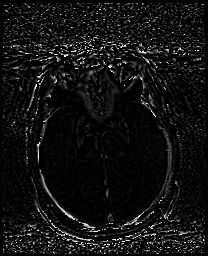
[im 57/57]
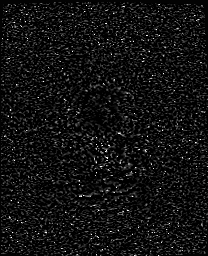

[Series 15: swi_images · axial · 3.0mm · 1.02mm/px · z∈[-124,+53]mm · 3 of 60 slices shown]
[im 1/60]
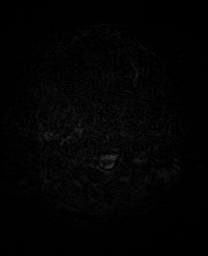
[im 30/60]
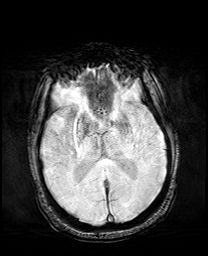
[im 60/60]
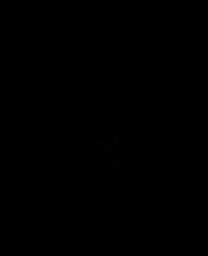

[Series 17: t1_mprage_tra_p2_iso no angle · axial · 1.0mm · 1.02mm/px · z∈[-118,+57]mm · 8 of 176 slices shown]
[im 1/176]
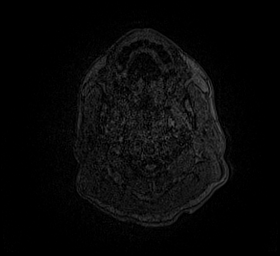
[im 22/176]
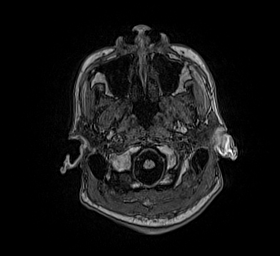
[im 44/176]
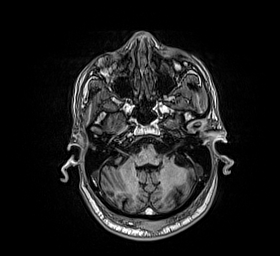
[im 66/176]
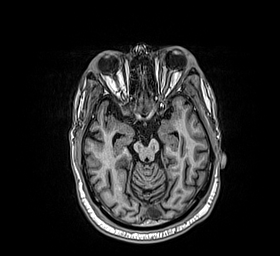
[im 110/176]
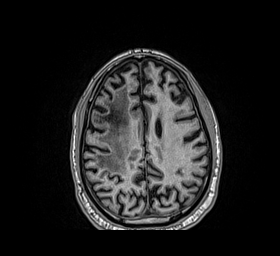
[im 132/176]
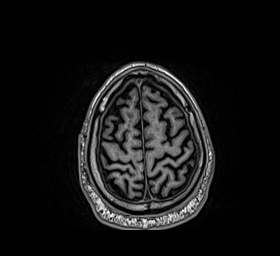
[im 154/176]
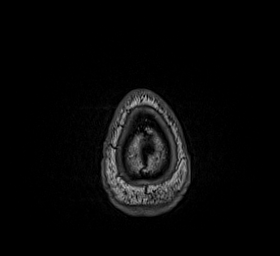
[im 176/176]
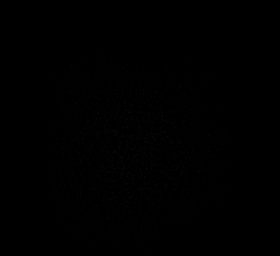

[Series 18: T2 · coronal · 3.0mm · 0.34mm/px · 2 of 46 slices shown (1 of 2)]
[im 1/46]
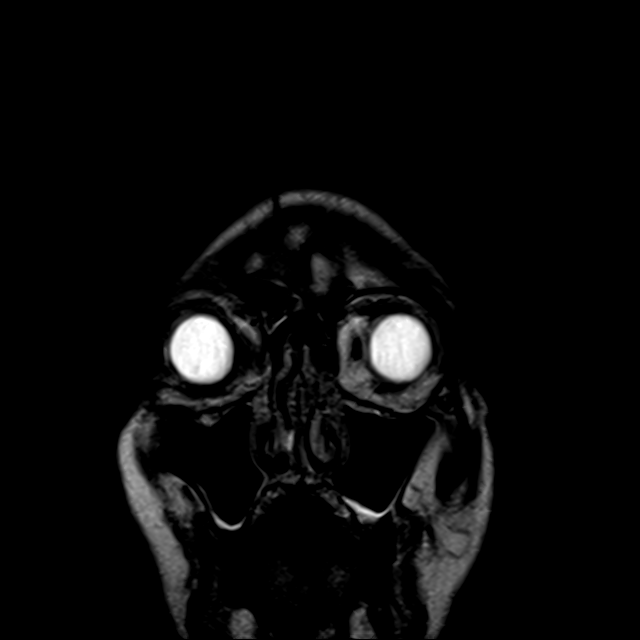
[im 46/46]
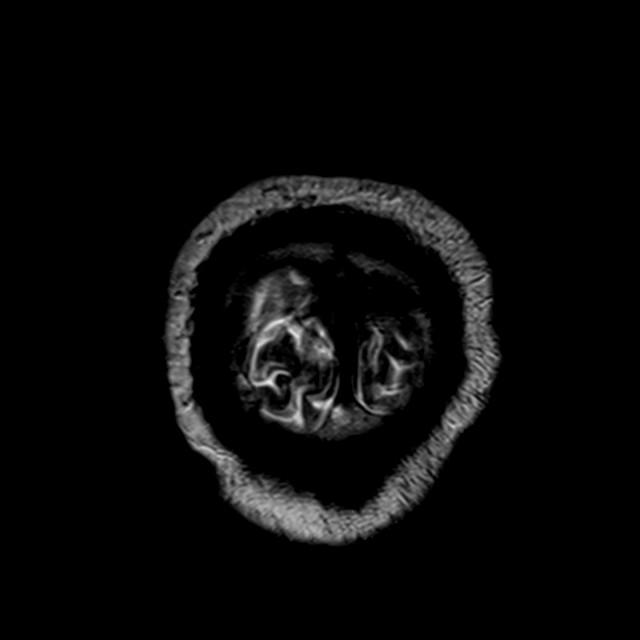

[Series 19: T2 · axial · 5.0mm · 0.78mm/px · 1 of 27 slices shown (2 of 2)]
[im 1/27]
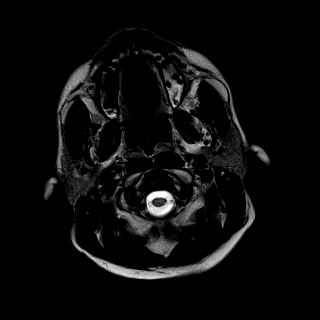

[Series 20: T1 post-contrast · coronal · 3.0mm · 0.34mm/px · 2 of 46 slices shown]
[im 1/46]
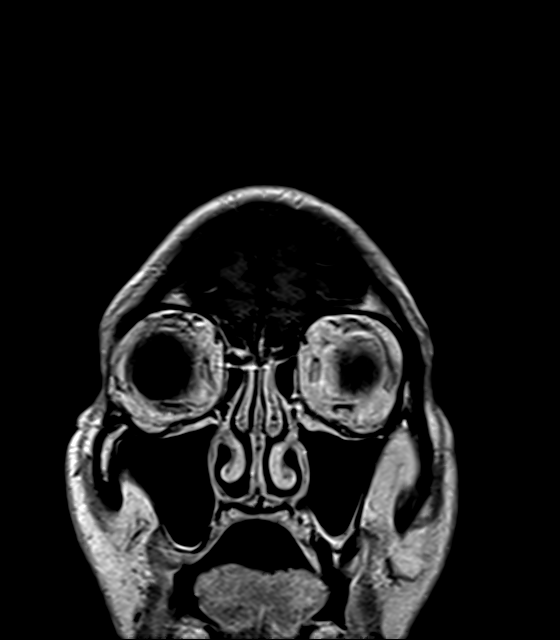
[im 46/46]
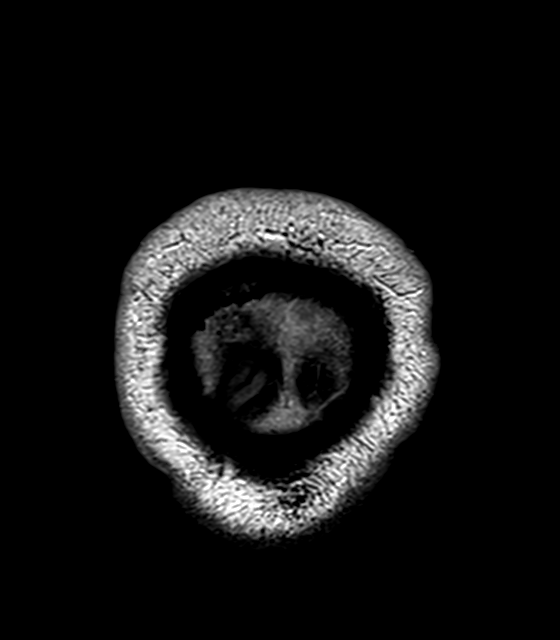

[Series 21: FLAIR · sagittal · 3.0mm · 0.78mm/px · 2 of 40 slices shown (3 of 3)]
[im 1/40]
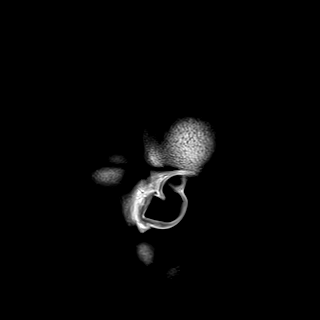
[im 40/40]
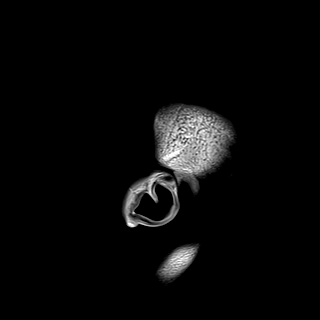

[Series 22: t1_mprage_tra_p2_iso · axial · 1.0mm · 0.98mm/px · z∈[-118,-76]mm · 3 of 172 slices shown]
[im 1/172]
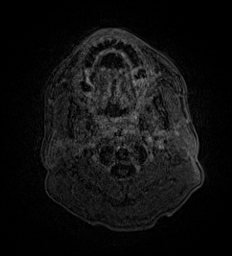
[im 22/172]
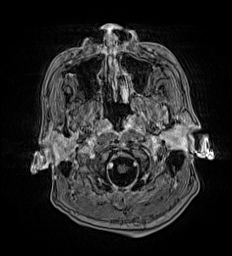
[im 43/172]
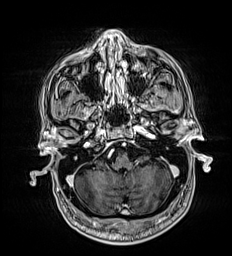

[41 of 48 positions shown; findings below may reference images not displayed]

FINDINGS: Brain: The previously described enhancing lesion in the high right
frontal lobe is decreased in size and now 4 mm (see series 22, image
128 and series 21, image 16). A nearby, slightly more superior and
anterior punctate enhancing lesion on the axial series in the high
right frontal lobe (series 22, image 135) and a punctate enhancing
lesion on the sagittal at the left frontoparietal junction (series
21, image 18) are not corroborated on other sequences. No other new
enhancing lesions identified. The previously described plaque-like
enhancement along the pons is significantly improved with mild
residual enhancement (series 21, image 20) and FLAIR hyperintensity.

No acute infarct, hemorrhage, hydrocephalus, or extra-axial fluid
collection. Similar sequela of prior right MCA territory infarct
with encephalomalacia and gliosis. Mild generalized cerebral volume
loss.

Vascular: Major arterial flow voids are maintained at the skull
base.

Skull and upper cervical spine: Normal marrow signal.

Sinuses/Orbits: Mild sinus mucosal thickening without air-fluid
levels. Unremarkable orbits.

Other: Trace bilateral mastoid effusions.
IMPRESSION: 1. The previously described enhancing lesion in the high right
frontal lobe is decreased in size, now 4 mm (6 mm on the prior).
2. The plaque-like enhancement along the pons is also improved with
mild residual enhancement
3. There are two punctate new foci of enhancement in the right
frontal lobe and the right frontoparietal region, which are only
seen on one series. While these could be artifactual or vascular,
they warrant attention on short interval follow-up.

## 2020-11-14 MED ORDER — GADOBUTROL 1 MMOL/ML IV SOLN
8.5000 mL | Freq: Once | INTRAVENOUS | Status: AC | PRN
  Administered 2020-11-14: 8.5 mL via INTRAVENOUS

## 2020-11-14 MED ORDER — WARFARIN SODIUM 3 MG PO TABS
3.0000 mg | ORAL_TABLET | Freq: Once | ORAL | Status: AC
  Administered 2020-11-14: 3 mg via ORAL
  Filled 2020-11-14: qty 1

## 2020-11-14 MED ORDER — INSULIN GLARGINE 100 UNIT/ML ~~LOC~~ SOLN
25.0000 [IU] | Freq: Two times a day (BID) | SUBCUTANEOUS | Status: DC
  Administered 2020-11-14 – 2020-11-24 (×20): 25 [IU] via SUBCUTANEOUS
  Filled 2020-11-14 (×21): qty 0.25

## 2020-11-14 NOTE — Progress Notes (Signed)
PROGRESS NOTE    Dennis [name].  LPF:[id number] DOB: 1960/10/17 DOA: 10/30/2020 PCP: [name], MD   Brief Narrative: Dennis Hopwood. is a 60 y.o. male with a history of systolic CHF last EF 53-29%,J/MEQAS, CAD, right MCA CVAin 06/2020,on Coumadin,small cell lung cancer with mets to brain (dx3/2021),DM type II, cocaine abuse, and remote alcohol abuse patient presented secondary to slipping and falling on ice resulting in a left distal femur fracture. He was found to be incidentally COVID-19 positive. He underwent repair and has been waiting for a SNF bed.   Assessment & Plan:   Principal Problem:   Femoral distal fracture (HCC) Active Problems:   Automatic implantable cardioverter-defibrillator in situ   Chronic systolic heart failure (HCC)   Cocaine abuse ([place])   Dysphagia   History of CVA with residual deficit   Uncontrolled type 2 diabetes mellitus with hyperglycemia, with long-term current use of insulin (HCC)   Orthostatic hypotension   Brain metastases ([place])   Fall   COVID-19 virus infection   Distal left femur fracture Secondary to fall. Orthopedic surgery consulted and patient underwent intramedullary femoral nailing on 10/31/2020. Plan for SNF on discharge per PT/OT recommendations.  COVID-19 infection Patient treated with Remdesivir x3 doses. Patient was previously on Decadron which was initially started at a higher dose and titrated down.   Acute respiratory failure with hypoxia Initial concern for possible heart failure for which he received one dose of IV lasix but possibly more related to COVID-19 infection. Hypoxia quickly resolved.  Chronic systolic heart failure S/p AICD Patient with an EF of 25-30%. Currently stable volume status. On Coumadin. Per cardiology, patient's AICD is nearing ERI; this will be addressed as an outpatient in light of patient's cancer. -Continue Coumadin,   Anemia of chronic disease Acute blood loss anemia Drop in  hemoglobin secondary to surgery/fracture. Stable.  Orthostatic hypotension Stable. Patient is on midodrine and salt tablets as an outpatient in light of underlying reduced EF heart failure. -Continue midodrine and salt tablet  Diabetes mellitus, type 2 -Increase to home Lantus 25 units BID and continue Novolog 2 units with meals and SSI  AKI Baseline creatinine of 0.9-1. Creatinine increased to 1.5 which improved with Lasix administration. Stable.Marland Kitchen  Dysphagia Speech therapy evaluated with evidence of mild oropharyngeal dysphagia in setting of prior stroke. On a regular consistency diet.  History of stroke Residual dysarthria/left sided weakness. On coumadin -Continue Coumadin  History of cocaine abuse Continues to use cocaine. UDS on 1/18 is positive for cocaine.  Small cell lung cancer Brain metastasis Patient follows with Dr. Mickeal Skinner and Dr. Julien Nordmann as an outpatient. He is s/p whole brain radiation and chemotherapy. Currently on dexamethasone. MRI on 2/2 significant for two new punctate foci of enhancement in right frontal lobe/right frontoparietal region which may or may not be related to artifact; recommendation for interval follow-up.   DVT prophylaxis: Coumadin Code Status:   Code Status: Full Code Family Communication: None at bedside Disposition Plan: Discharge to SNF when bed is available. Medically stable.   Consultants:   Cardiology  Neurooncology  Orthopedic surgery  Procedures:   IM NAILING  Antimicrobials:  Cefazolin    Subjective: No issues today. No significant leg pain.  Objective: Vitals:   11/13/20 2022 11/14/20 0500 11/14/20 0515 11/14/20 0700  BP: 109/80  104/80 104/80  Pulse: 99  96 91  Resp: 16   17  Temp: 97.9 F (36.6 C)  97.7 F (36.5 C) 98 F (36.7 C)  TempSrc:   Oral Oral  SpO2: 97%  94% 100%  Weight:  84.1 kg    Height:        Intake/Output Summary (Last 24 hours) at 11/14/2020 1443 Last data filed at 11/14/2020 1300 Gross  per 24 hour  Intake 3359.73 ml  Output 1175 ml  Net 2184.73 ml   Filed Weights   11/12/20 0500 11/13/20 0500 11/14/20 0500  Weight: 84 kg 85.1 kg 84.1 kg    Examination:  General exam: Appears calm and comfortable Respiratory system: Clear to auscultation. Respiratory effort normal. Cardiovascular system: S1 & S2 heard, RRR. No murmurs, rubs, gallops or clicks. Gastrointestinal system: Abdomen is nondistended, soft and nontender. No organomegaly or masses felt. Normal bowel sounds heard. Central nervous system: Alert and oriented. Musculoskeletal: No edema. No calf tenderness. Left knee incisions covered in dressing with no surrounding ecchymosis/erythema. Skin: No cyanosis. No rashes Psychiatry: Judgement and insight appear normal. Mood & affect appropriate.     Data Reviewed: I have personally reviewed following labs and imaging studies  CBC Lab Results  Component Value Date   WBC 13.6 (H) 11/14/2020   RBC 2.86 (L) 11/14/2020   HGB 9.0 (L) 11/14/2020   HCT 28.9 (L) 11/14/2020   MCV 101.0 (H) 11/14/2020   MCH 31.5 11/14/2020   PLT 234 11/14/2020   MCHC 31.1 11/14/2020   RDW 15.8 (H) 11/14/2020   LYMPHSABS 2.3 11/04/2020   MONOABS 1.4 (H) 11/04/2020   EOSABS 0.1 11/04/2020   BASOSABS 0.0 00/17/4944     Last metabolic panel Lab Results  Component Value Date   NA 136 11/14/2020   K 4.1 11/14/2020   CL 104 11/14/2020   CO2 26 11/14/2020   BUN 22 (H) 11/14/2020   CREATININE 1.12 11/14/2020   GLUCOSE 267 (H) 11/14/2020   GFRNONAA >60 11/14/2020   GFRAA >60 07/16/2020   CALCIUM 8.9 11/14/2020   PHOS 3.2 11/03/2020   PROT 5.2 (L) 11/04/2020   ALBUMIN 2.2 (L) 11/04/2020   BILITOT 0.7 11/04/2020   ALKPHOS 78 11/04/2020   AST 21 11/04/2020   ALT 14 11/04/2020   ANIONGAP 6 11/14/2020    CBG (last 3)  Recent Labs    11/13/20 2020 11/14/20 0753 11/14/20 1125  GLUCAP 217* 221* 200*     GFR: Estimated Creatinine Clearance: 78.9 mL/min (by C-G formula  based on SCr of 1.12 mg/dL).  Coagulation Profile: Recent Labs  Lab 11/10/20 0415 11/11/20 0335 11/12/20 0358 11/13/20 0301 11/14/20 0400  INR 2.2* 2.2* 2.3* 2.5* 2.6*    No results found for this or any previous visit (from the past 240 hour(s)).      Radiology Studies: MR BRAIN W WO CONTRAST  Result Date: 11/14/2020 CLINICAL DATA:  Brain mass or lesion.  Lung cancer. EXAM: MRI HEAD WITHOUT AND WITH CONTRAST TECHNIQUE: Multiplanar, multiecho pulse sequences of the brain and surrounding structures were obtained without and with intravenous contrast. CONTRAST:  8.18mL GADAVIST GADOBUTROL 1 MMOL/ML IV SOLN COMPARISON:  MRI August 24, 2020. FINDINGS: Brain: The previously described enhancing lesion in the high right frontal lobe is decreased in size and now 4 mm (see series 22, image 128 and series 21, image 16). A nearby, slightly more superior and anterior punctate enhancing lesion on the axial series in the high right frontal lobe (series 22, image 135) and a punctate enhancing lesion on the sagittal at the left frontoparietal junction (series 21, image 18) are not corroborated on other sequences. No other new enhancing lesions identified.  The previously described plaque-like enhancement along the pons is significantly improved with mild residual enhancement (series 21, image 20) and FLAIR hyperintensity. No acute infarct, hemorrhage, hydrocephalus, or extra-axial fluid collection. Similar sequela of prior right MCA territory infarct with encephalomalacia and gliosis. Mild generalized cerebral volume loss. Vascular: Major arterial flow voids are maintained at the skull base. Skull and upper cervical spine: Normal marrow signal. Sinuses/Orbits: Mild sinus mucosal thickening without air-fluid levels. Unremarkable orbits. Other: Trace bilateral mastoid effusions. IMPRESSION: 1. The previously described enhancing lesion in the high right frontal lobe is decreased in size, now 4 mm (6 mm on the  prior). 2. The plaque-like enhancement along the pons is also improved with mild residual enhancement 3. There are two punctate new foci of enhancement in the right frontal lobe and the right frontoparietal region, which are only seen on one series. While these could be artifactual or vascular, they warrant attention on short interval follow-up. Electronically Signed   By: Margaretha Sheffield MD   On: 11/14/2020 12:18   DG FEMUR MIN 2 VIEWS LEFT  Result Date: 11/13/2020 CLINICAL DATA:  Status post open reduction internal fixation of left femur fracture. EXAM: LEFT FEMUR 2 VIEWS COMPARISON:  October 31, 2020. FINDINGS: Status post intramedullary rod fixation of comminuted distal left femoral fracture. Good alignment of fracture components is noted. IMPRESSION: Status post intramedullary rod fixation of comminuted distal left femoral fracture. Electronically Signed   By: Marijo Conception M.D.   On: 11/13/2020 09:01        Scheduled Meds: . atorvastatin  20 mg Oral Daily  . Chlorhexidine Gluconate Cloth  6 each Topical Daily  . cholecalciferol  2,000 Units Oral BID  . dexamethasone  2 mg Oral Daily  . feeding supplement (GLUCERNA SHAKE)  237 mL Oral BID BM  . insulin aspart  0-15 Units Subcutaneous TID WC  . insulin aspart  0-5 Units Subcutaneous QHS  . insulin aspart  2 Units Subcutaneous TID WC  . insulin glargine  15 Units Subcutaneous BID  . midodrine  10 mg Oral TID WC  . multivitamin with minerals  1 tablet Oral Daily  . polyethylene glycol  17 g Oral Daily  . senna-docusate  1 tablet Oral BID  . sodium chloride flush  10-40 mL Intracatheter Q12H  . sodium chloride  1 g Oral q AM  . tamsulosin  0.4 mg Oral Daily  . warfarin  3 mg Oral ONCE-1600  . Warfarin - Pharmacist Dosing Inpatient   Does not apply q1600   Continuous Infusions: . lactated ringers 125 mL/hr at 11/14/20 1156     LOS: 15 days     Cordelia Poche, MD [facility] 11/14/2020, 2:43 PM  If 7PM-7AM, please  contact night-coverage [email or web address]

## 2020-11-14 NOTE — Progress Notes (Signed)
Physical Therapy Treatment Patient Details Name: Dennis [name]. MRN: [id number] DOB: 1961/01/02 Today's Date: 11/14/2020    History of Present Illness Patient is a 60 y/o male with PMH significant for IDDM, HTN, HLD, CVA in 2016, lung cancer, and CAD on warfarin. Patient had mechanical fall resulting in L femur fx. Patient s/p IM nail L femur on 1/19. Covid+ on precautions as of 10/31/19; off prec as of 1/31    PT Comments    Patient progressing towards physical therapy goals. Session focused on mobility and therex. Patient with difficulty increasing WBing through LLE during ambulation. Patient continues to be limited by generalized weakness, decreased activity tolerance, and impaired balance. Continue to recommend SNF for ongoing Physical Therapy.       Follow Up Recommendations  SNF;Supervision/Assistance - 24 hour     Equipment Recommendations  Rolling Kelty Szafran with 5" wheels    Recommendations for Other Services       Precautions / Restrictions Precautions Precautions: Fall Restrictions Weight Bearing Restrictions: Yes LLE Weight Bearing: Weight bearing as tolerated    Mobility  Bed Mobility Overal bed mobility: Needs Assistance Bed Mobility: Supine to Sit     Supine to sit: Min assist;HOB elevated     General bed mobility comments: minA for L LE mgt  Transfers Overall transfer level: Needs assistance Equipment used: Rolling Yoshika Vensel (2 wheeled) Transfers: Sit to/from Stand Sit to Stand: Mod assist         General transfer comment: cues for hand placement. ModA to rise and steady  Ambulation/Gait Ambulation/Gait assistance: Min Web designer (Feet): 15 Feet Assistive device: Rolling Hibba Schram (2 wheeled) Gait Pattern/deviations: Step-through pattern;Antalgic;Wide base of support     General Gait Details: cues for increasing WBing through LLE and sequencing. Patient with uncontrolled descent into chair following ambulation   Stairs              Wheelchair Mobility    Modified Rankin (Stroke Patients Only)       Balance Overall balance assessment: Needs assistance Sitting-balance support: Feet supported;No upper extremity supported Sitting balance-Leahy Scale: Fair     Standing balance support: Bilateral upper extremity supported;During functional activity Standing balance-Leahy Scale: Poor Standing balance comment: reliant on UE support                            Cognition Arousal/Alertness: Awake/alert Behavior During Therapy: WFL for tasks assessed/performed Overall Cognitive Status: Within Functional Limits for tasks assessed                                        Exercises General Exercises - Lower Extremity Quad Sets: AROM;Both;10 reps Long Arc Quad: Both;AROM;10 reps Hip ABduction/ADduction: 10 reps (Pillow squeeze) Straight Leg Raises: Right;5 reps Hip Flexion/Marching: 10 reps;Seated    General Comments        Pertinent Vitals/Pain Pain Assessment: No/denies pain    Home Living                      Prior Function            PT Goals (current goals can now be found in the care plan section) Acute Rehab PT Goals Patient Stated Goal: to go home PT Goal Formulation: With patient Time For Goal Achievement: 11/28/20 Potential to Achieve Goals: Fair Progress towards PT goals: Progressing toward goals  Frequency    Min 3X/week      PT Plan Current plan remains appropriate    Co-evaluation              AM-PAC PT "6 Clicks" Mobility   Outcome Measure  Help needed turning from your back to your side while in a flat bed without using bedrails?: A Little Help needed moving from lying on your back to sitting on the side of a flat bed without using bedrails?: A Lot Help needed moving to and from a bed to a chair (including a wheelchair)?: A Little Help needed standing up from a chair using your arms (e.g., wheelchair or bedside chair)?: A  Lot Help needed to walk in hospital room?: A Little Help needed climbing 3-5 steps with a railing? : A Lot 6 Click Score: 15    End of Session Equipment Utilized During Treatment: Gait belt Activity Tolerance: Patient tolerated treatment well Patient left: in chair;with call bell/phone within reach;with chair alarm set Nurse Communication: Mobility status PT Visit Diagnosis: Unsteadiness on feet (R26.81);Other abnormalities of gait and mobility (R26.89);Muscle weakness (generalized) (M62.81);History of falling (Z91.81)     Time: 9628-3662 PT Time Calculation (min) (ACUTE ONLY): 18 min  Charges:  $Therapeutic Activity: 8-22 mins                     Jazmen Lindenbaum A. Gilford Rile PT, DPT Acute Rehabilitation Services Pager [phone] Office [phone]    Melene Plan Allred 11/14/2020, 9:02 AM

## 2020-11-14 NOTE — Progress Notes (Addendum)
Orthopaedic Trauma Progress Note  SUBJECTIVE: Doing well, no issues with left leg. Pain controlled. No chest pain. No SOB. No nausea/vomiting. No other complaints. Patient stable for discharge, no SNF bed offer currently.  OBJECTIVE:  Vitals:   11/14/20 0515 11/14/20 0700  BP: 104/80 104/80  Pulse: 96 91  Resp:  17  Temp: 97.7 F (36.5 C) 98 F (36.7 C)  SpO2: 94% 100%    General: Sitting up in bed, NAD Respiratory: No increased work of breathing.  WCB:[id number] CDI. No significant tenderness with palpation through leg. Holds knee in about 10 degrees of flexion, but does tolerate motion to extend. Active knee flexion to about 70 degree currently.  Ankle dorsiflexion/plantarflexion intact. Endorses sensation to light touch distally. 2+ DP pulse    IMAGING: Repeat imaging left femur done 11/13/20 stable.  LABS:   ASSESSMENT: Dennis [name]. is a 60 y.o. male, 14 Days Post-Op s/p RETROGRADE IMN LEFT FEMUR  CV/Blood loss:Hgb stable. Hemodynamically stable  PLAN: Weightbearing: WBAT LLE Incisional and dressing care:Ok to leave incisions open to air.   Showering: Ok to shower with assistance  Orthopedic device(s): None  Pain management:  1. Tylenol 325-650 mg q 6 hours PRN 2. Robaxin 500 mg q 6 hours PRN 3. Norco 5-325 mg q 6 hours PRN  VTE prophylaxis:  Coumadin, SCDs Impediments to Fracture Healing: Vit D level 12, continue on D3 supplementation Dispo: Therapies as tolerated. PT/OT recommending SNF. Ok for d/c from ortho standpoint once SNF bed becomes available Follow - up plan: 2 weeks  Contact information:  Katha Hamming MD, Patrecia Pace PA-C. After hours and holidays please check [email or web address] for group call information for [facility]   [name], PA-C [phone] (office) [email or web address]

## 2020-11-14 NOTE — Progress Notes (Signed)
Pt transferred to and from MRI. Paradoxical breathing noted every 52-56 second, which decreased his O2 to 80-86 for 2-5 seconds period each time. No distress noted.  RN notified and aware.

## 2020-11-14 NOTE — Progress Notes (Signed)
ANTICOAGULATION CONSULT NOTE  Pharmacy Consult:  Coumadin Indication: CVA  No Known Allergies  Patient Measurements: Height: 6' (182.9 cm) Weight: 84.1 kg (185 lb 6.5 oz) IBW/kg (Calculated) : 77.6 Heparin Dosing Weight: 85.3 kg  Vital Signs: Temp: 98 F (36.7 C) (02/02 0700) Temp Source: Oral (02/02 0700) BP: 104/80 (02/02 0700) Pulse Rate: 91 (02/02 0700)  Labs: Recent Labs    11/12/20 0358 11/12/20 1259 11/12/20 1259 11/13/20 0301 11/14/20 0400  HGB  --  9.9*   < > 9.2* 9.0*  HCT  --  31.1*  --  30.7* 28.9*  PLT  --  235  --  223 234  LABPROT 24.7*  --   --  26.0* 27.0*  INR 2.3*  --   --  2.5* 2.6*  CREATININE  --  1.39*  --  1.39* 1.12   < > = values in this interval not displayed.    Estimated Creatinine Clearance: 78.9 mL/min (by C-G formula based on SCr of 1.12 mg/dL).   Assessment: 60 YOM on Coumadin PTA for hx CVA. Admitted with INR 1.9 on PTA dose of 3.75 mg daily EXCEPT for 7.5 mg on Tues (+/- Thurs).  Held and reversed for repair of femur fracture with ortho on 10/31/20. Coumadin resumed on 11/01/20 without a bridge.  INR therapeutic at 2.6 today. No reported bleeding. Hgb 9.0. Plt wnl.   Goal of Therapy:  INR 2-3 Monitor platelets by anticoagulation protocol: Yes   Plan:  - Warfarin 3 mg tonight - Monitor daily PT/INR and CBC - Will continue to monitor for any signs/symptoms of bleeding   Thank you for allowing pharmacy to be a part of this patient's care.  Cristela Felt, PharmD Clinical Pharmacist

## 2020-11-15 DIAGNOSIS — S72402A Unspecified fracture of lower end of left femur, initial encounter for closed fracture: Secondary | ICD-10-CM | POA: Diagnosis not present

## 2020-11-15 LAB — GLUCOSE, CAPILLARY
Glucose-Capillary: 253 mg/dL — ABNORMAL HIGH (ref 70–99)
Glucose-Capillary: 158 mg/dL — ABNORMAL HIGH (ref 70–99)
Glucose-Capillary: 154 mg/dL — ABNORMAL HIGH (ref 70–99)

## 2020-11-15 LAB — PROTIME-INR
INR: 2.6 — ABNORMAL HIGH (ref 0.8–1.2)
Prothrombin Time: 26.9 seconds — ABNORMAL HIGH (ref 11.4–15.2)

## 2020-11-15 LAB — BASIC METABOLIC PANEL
Anion gap: 7 (ref 5–15)
BUN: 20 mg/dL (ref 6–20)
CO2: 26 mmol/L (ref 22–32)
Calcium: 8.6 mg/dL — ABNORMAL LOW (ref 8.9–10.3)
Chloride: 103 mmol/L (ref 98–111)
Creatinine, Ser: 1.06 mg/dL (ref 0.61–1.24)
GFR, Estimated: >60 mL/min (ref >60)
Glucose, Bld: 288 mg/dL — ABNORMAL HIGH (ref 70–99)
Potassium: 4 mmol/L (ref 3.5–5.1)
Sodium: 136 mmol/L (ref 135–145)

## 2020-11-15 LAB — CBC
HCT: 29.2 % — ABNORMAL LOW (ref 39.0–52.0)
Hemoglobin: 9.1 g/dL — ABNORMAL LOW (ref 13.0–17.0)
MCH: 31.4 pg (ref 26.0–34.0)
MCHC: 31.2 g/dL (ref 30.0–36.0)
MCV: 100.7 fL — ABNORMAL HIGH (ref 80.0–100.0)
Platelets: 213 10*3/uL (ref 150–400)
RBC: 2.9 MIL/uL — ABNORMAL LOW (ref 4.22–5.81)
RDW: 15.9 % — ABNORMAL HIGH (ref 11.5–15.5)
WBC: 11.4 10*3/uL — ABNORMAL HIGH (ref 4.0–10.5)
nRBC: 0.2 % (ref 0.0–0.2)

## 2020-11-15 MED ORDER — WARFARIN SODIUM 4 MG PO TABS
4.0000 mg | ORAL_TABLET | Freq: Every day | ORAL | Status: DC
  Administered 2020-11-15 – 2020-11-27 (×13): 4 mg via ORAL
  Filled 2020-11-15 (×15): qty 1

## 2020-11-15 NOTE — Progress Notes (Signed)
Physical Therapy Treatment Patient Details Name: Dennis [name]. MRN: [id number] DOB: 03-06-1961 Today's Date: 11/15/2020    History of Present Illness Patient is a 60 y/o male with PMH significant for IDDM, HTN, HLD, CVA in 2016, lung cancer, and CAD on warfarin. Patient had mechanical fall resulting in L femur fx. Patient s/p IM nail L femur on 1/19. Covid+ on precautions as of 10/31/19; off prec as of 1/31    PT Comments    Patient reports fatigue, however agreeable to standing and supine therex this session. Patient with noted SOB during standing activities, spO2 WFL. Patient continues to be limited by decreased activity tolerance, impaired balance, generalized weakness. Continue to recommend SNF for ongoing Physical Therapy.       Follow Up Recommendations  SNF;Supervision/Assistance - 24 hour     Equipment Recommendations  Rolling Jermichael Belmares with 5" wheels    Recommendations for Other Services       Precautions / Restrictions Precautions Precautions: Fall Restrictions Weight Bearing Restrictions: Yes LLE Weight Bearing: Weight bearing as tolerated    Mobility  Bed Mobility Overal bed mobility: Needs Assistance Bed Mobility: Supine to Sit     Supine to sit: Min guard;HOB elevated Sit to supine: Min assist   General bed mobility comments: increased time and effort, used rail  Transfers Overall transfer level: Needs assistance Equipment used: Rolling Christianne Zacher (2 wheeled) Transfers: Sit to/from Stand Sit to Stand: Mod assist         General transfer comment: verbal cues for correct hand placement. sit to stand x 8  Ambulation/Gait             General Gait Details: patient declined. Patient had previously ambulated with OT around the room   Stairs             Wheelchair Mobility    Modified Rankin (Stroke Patients Only)       Balance Overall balance assessment: Needs assistance Sitting-balance support: Feet supported;No upper extremity  supported Sitting balance-Leahy Scale: Fair     Standing balance support: Bilateral upper extremity supported;During functional activity Standing balance-Leahy Scale: Poor Standing balance comment: reliant on UE support                            Cognition Arousal/Alertness: Awake/alert Behavior During Therapy: WFL for tasks assessed/performed Overall Cognitive Status: Within Functional Limits for tasks assessed                                        Exercises General Exercises - Lower Extremity Long Arc Quad: Both;AROM;10 reps Heel Slides: Both;10 reps Hip ABduction/ADduction: Both;10 reps Straight Leg Raises: Both;10 reps Hip Flexion/Marching: Both;10 reps;Seated;Standing Other Exercises Other Exercises: sit to stand x 7 with modA progressing to minA near end    General Comments        Pertinent Vitals/Pain Pain Assessment: Faces Faces Pain Scale: No hurt Pain Location: L LE in standing and with weight bearing Pain Descriptors / Indicators: Grimacing;Guarding Pain Intervention(s): Monitored during session;Repositioned    Home Living                      Prior Function            PT Goals (current goals can now be found in the care plan section) Acute Rehab PT Goals Patient Stated Goal: to  go home PT Goal Formulation: With patient Time For Goal Achievement: 11/28/20 Potential to Achieve Goals: Fair Progress towards PT goals: Progressing toward goals    Frequency    Min 3X/week      PT Plan Current plan remains appropriate    Co-evaluation              AM-PAC PT "6 Clicks" Mobility   Outcome Measure  Help needed turning from your back to your side while in a flat bed without using bedrails?: A Little Help needed moving from lying on your back to sitting on the side of a flat bed without using bedrails?: A Lot Help needed moving to and from a bed to a chair (including a wheelchair)?: A Little Help needed  standing up from a chair using your arms (e.g., wheelchair or bedside chair)?: A Lot Help needed to walk in hospital room?: A Little Help needed climbing 3-5 steps with a railing? : A Lot 6 Click Score: 15    End of Session Equipment Utilized During Treatment: Gait belt Activity Tolerance: Patient tolerated treatment well Patient left: in bed;with call bell/phone within reach Nurse Communication: Mobility status PT Visit Diagnosis: Unsteadiness on feet (R26.81);Other abnormalities of gait and mobility (R26.89);Muscle weakness (generalized) (M62.81);History of falling (Z91.81)     Time: 3888-2800 PT Time Calculation (min) (ACUTE ONLY): 19 min  Charges:  $Therapeutic Exercise: 8-22 mins                     Leilene Diprima A. Gilford Rile PT, DPT Acute Rehabilitation Services Pager [phone] Office [phone]    Alda [name] 11/15/2020, 5:13 PM

## 2020-11-15 NOTE — Progress Notes (Signed)
Nutrition Follow-up  DOCUMENTATION CODES:   Not applicable  INTERVENTION:   -Continue MVI with minerals daily -Continue Glucerna Shake po BID, each supplement provides 220 kcal and 10 grams of protein  NUTRITION DIAGNOSIS:   Increased nutrient needs related to post-op healing as evidenced by estimated needs.  Ongoing  GOAL:   Patient will meet greater than or equal to 90% of their needs  Progressing   MONITOR:   Supplement acceptance,PO intake,I & O's,Weight trends,Labs,Skin  REASON FOR ASSESSMENT:   Consult Hip fracture protocol  ASSESSMENT:   61 YOM with PMH of CAD, CHF, ischemic cardiomyopathy, AICD, DM2, stroke, lung cancer mets to brain. Pt admitted 10/30/20 for fall, x-ray indicated left femur fx. Pt underwent surgery for fx. Pt positive for COVID-19.  1/19- s/p Retrograde intramedullary nailing of left femur fracture  Reviewed I/O's: +2.3 L x 24 hours and -6.8 L since 11/01/20  UOP: 750 ml x 24 hours  Pt unavailable at time of visit.   Pt remains with good appetite. Noted meal completion 100%. Pt is consuming Glucerna supplements.   Medications reviewed and include vitamin D3, decadron, miralax, and lactated ringers infusion @ 125 ml/hr.   Per MD notes, pt remains medically stable for discharge. He is awaiting SNF placement.   Labs reviewed: CBGS: [id number] (inpatient orders for glycemic control are 0-15 units insulin aspart TID with meals, 0-5 units insulin aspart daily at bedtime, 2 units insulin aspart TID with meals, and 25 units insulin glargine BID).   Diet Order:   Diet Order            Diet Carb Modified Fluid consistency: Thin; Room service appropriate? Yes  Diet effective now           Diet - low sodium heart healthy                 EDUCATION NEEDS:   No education needs have been identified at this time  Skin:  Skin Assessment: Skin Integrity Issues: Skin Integrity Issues:: Incisions Incisions: Closed incision on left leg  (10/31/20)  Last BM:  11/14/20  Height:   Ht Readings from Last 1 Encounters:  10/31/20 6' (1.829 m)    Weight:   Wt Readings from Last 1 Encounters:  11/15/20 86.5 kg    Ideal Body Weight:  81 kg  BMI:  Body mass index is 25.86 kg/m.  Estimated Nutritional Needs:   Kcal:  2000-2400  Protein:  95-110  Fluid:  >2 L    Loistine Chance, RD, LDN, [place] Registered Dietitian II Certified Diabetes Care and Education Specialist Please refer to [facility] for RD and/or RD on-call/weekend/after hours pager

## 2020-11-15 NOTE — Progress Notes (Addendum)
ANTICOAGULATION CONSULT NOTE  Pharmacy Consult:  Coumadin Indication: CVA  No Known Allergies  Patient Measurements: Height: 6' (182.9 cm) Weight: 86.5 kg (190 lb 11.2 oz) IBW/kg (Calculated) : 77.6 Heparin Dosing Weight: 85.3 kg  Vital Signs: Temp: 98.2 F (36.8 C) (02/03 0815) Temp Source: Oral (02/03 0815) BP: 123/87 (02/03 0815) Pulse Rate: 99 (02/03 0815)  Labs: Recent Labs    11/13/20 0301 11/14/20 0400 11/15/20 0434  HGB 9.2* 9.0* 9.1*  HCT 30.7* 28.9* 29.2*  PLT 223 234 213  LABPROT 26.0* 27.0* 26.9*  INR 2.5* 2.6* 2.6*  CREATININE 1.39* 1.12 1.06    Estimated Creatinine Clearance: 83.4 mL/min (by C-G formula based on SCr of 1.06 mg/dL).   Assessment: 85 YOM on Coumadin PTA for hx CVA. Admitted with INR 1.9 on PTA dose of 3.75 mg daily EXCEPT for 7.5 mg on Tues (+/- Thurs per [facility] note).  Held and reversed for repair of femur fracture with ortho on 10/31/20. Coumadin resumed on 11/01/20 without a bridge.  INR therapeutic > 10 days, at 2.6 today. No reported bleeding. Hgb 9.0. Plt wnl. Avg home dose = 4.2 mg/d, avg last 10 days =  3.75mg . Now eating 100%.   Goal of Therapy:  INR 2-3 Monitor platelets by anticoagulation protocol: Yes   Plan:  Schedule warfarin 4mg  daily  Monitor QMon INR, weekly CBC/plt Monitor for signs/symptoms of bleeding   Benetta Spar, PharmD, BCPS, BCCP Clinical Pharmacist  Please check AMION for all [place] phone numbers After 10:00 PM, call [place] [phone]

## 2020-11-15 NOTE — Plan of Care (Signed)
Patient is s/p left IM nailing on 1/18. Steri strips to left leg clean dry and intact. Still waiting for SNF bed offers. NAD or needs voiced. Will continue to monitor and continue current POC.

## 2020-11-15 NOTE — Progress Notes (Signed)
PROGRESS NOTE    Dennis Joriel Streety.  VHQ:[id number] DOB: January 26, 1961 DOA: 10/30/2020 PCP: [name], MD   Brief Narrative: Dennis Lightsey. is a 60 y.o. male with a history of systolic CHF last EF 41-32%,G/MWNUU, CAD, right MCA CVAin 06/2020,on Coumadin,small cell lung cancer with mets to brain (dx3/2021),DM type II, cocaine abuse, and remote alcohol abuse patient presented secondary to slipping and falling on ice resulting in a left distal femur fracture. He was found to be incidentally COVID-19 positive. He underwent repair and has been waiting for a SNF bed.   Assessment & Plan:   Principal Problem:   Femoral distal fracture (HCC) Active Problems:   Automatic implantable cardioverter-defibrillator in situ   Chronic systolic heart failure (HCC)   Cocaine abuse ([place])   Dysphagia   History of CVA with residual deficit   Uncontrolled type 2 diabetes mellitus with hyperglycemia, with long-term current use of insulin (HCC)   Orthostatic hypotension   Brain metastases ([place])   Fall   COVID-19 virus infection   Distal left femur fracture Secondary to fall. Orthopedic surgery consulted and patient underwent intramedullary femoral nailing on 10/31/2020. Plan for SNF on discharge per PT/OT recommendations.  COVID-19 infection Patient treated with Remdesivir x3 doses. Patient was previously on Decadron which was initially started at a higher dose and titrated down.   Acute respiratory failure with hypoxia Initial concern for possible heart failure for which he received one dose of IV lasix but possibly more related to COVID-19 infection. Hypoxia quickly resolved.  Chronic systolic heart failure S/p AICD Patient with an EF of 25-30%. Currently stable volume status. On Coumadin. Per cardiology, patient's AICD is nearing ERI; this will be addressed as an outpatient in light of patient's cancer. -Continue Coumadin  Anemia of chronic disease Acute blood loss anemia Drop in  hemoglobin secondary to surgery/fracture. Stable.  Orthostatic hypotension Stable. Patient is on midodrine and salt tablets as an outpatient in light of underlying reduced EF heart failure. -Continue midodrine and salt tablet  Diabetes mellitus, type 2 -Increase to home Lantus 25 units BID and continue Novolog 2 units with meals and SSI  AKI Baseline creatinine of 0.9-1. Creatinine increased to 1.5 which improved with Lasix administration. Stable.Marland Kitchen  Dysphagia Speech therapy evaluated with evidence of mild oropharyngeal dysphagia in setting of prior stroke. On a regular consistency diet.  History of stroke Residual dysarthria/left sided weakness. On coumadin -Continue Coumadin  History of cocaine abuse Continues to use cocaine. UDS on 1/18 is positive for cocaine.  Small cell lung cancer Brain metastasis Patient follows with Dr. Mickeal Skinner and Dr. Julien Nordmann as an outpatient. He is s/p whole brain radiation and chemotherapy. Currently on dexamethasone. MRI on 2/2 significant for two new punctate foci of enhancement in right frontal lobe/right frontoparietal region which may or may not be related to artifact; recommendation for interval follow-up.   DVT prophylaxis: Coumadin Code Status:   Code Status: Full Code Family Communication: None at bedside Disposition Plan: Discharge to SNF when bed is available. Medically stable.   Consultants:   Cardiology  Neurooncology  Orthopedic surgery  Procedures:   IM NAILING  Antimicrobials:  Cefazolin    Subjective: No issues overnight.  Objective: Vitals:   11/15/20 0500 11/15/20 0505 11/15/20 0815 11/15/20 1302  BP:  119/88 123/87 120/80  Pulse:  92 99 95  Resp:  20 17 17   Temp:  (!) 97.4 F (36.3 C) 98.2 F (36.8 C) 98.1 F (36.7 C)  TempSrc:  Oral Oral  Oral  SpO2:  91% 95% 94%  Weight: 86.5 kg     Height:        Intake/Output Summary (Last 24 hours) at 11/15/2020 1412 Last data filed at 11/15/2020 0900 Gross per 24  hour  Intake 480 ml  Output 751 ml  Net -271 ml   Filed Weights   11/13/20 0500 11/14/20 0500 11/15/20 0500  Weight: 85.1 kg 84.1 kg 86.5 kg    Examination:  General: Well appearing, no distress    Data Reviewed: I have personally reviewed following labs and imaging studies  CBC Lab Results  Component Value Date   WBC 11.4 (H) 11/15/2020   RBC 2.90 (L) 11/15/2020   HGB 9.1 (L) 11/15/2020   HCT 29.2 (L) 11/15/2020   MCV 100.7 (H) 11/15/2020   MCH 31.4 11/15/2020   PLT 213 11/15/2020   MCHC 31.2 11/15/2020   RDW 15.9 (H) 11/15/2020   LYMPHSABS 2.3 11/04/2020   MONOABS 1.4 (H) 11/04/2020   EOSABS 0.1 11/04/2020   BASOSABS 0.0 16/07/9603     Last metabolic panel Lab Results  Component Value Date   NA 136 11/15/2020   K 4.0 11/15/2020   CL 103 11/15/2020   CO2 26 11/15/2020   BUN 20 11/15/2020   CREATININE 1.06 11/15/2020   GLUCOSE 288 (H) 11/15/2020   GFRNONAA >60 11/15/2020   GFRAA >60 07/16/2020   CALCIUM 8.6 (L) 11/15/2020   PHOS 3.2 11/03/2020   PROT 5.2 (L) 11/04/2020   ALBUMIN 2.2 (L) 11/04/2020   BILITOT 0.7 11/04/2020   ALKPHOS 78 11/04/2020   AST 21 11/04/2020   ALT 14 11/04/2020   ANIONGAP 7 11/15/2020    CBG (last 3)  Recent Labs    11/14/20 1958 11/15/20 0649 11/15/20 1112  GLUCAP 236* 253* 158*     GFR: Estimated Creatinine Clearance: 83.4 mL/min (by C-G formula based on SCr of 1.06 mg/dL).  Coagulation Profile: Recent Labs  Lab 11/11/20 0335 11/12/20 0358 11/13/20 0301 11/14/20 0400 11/15/20 0434  INR 2.2* 2.3* 2.5* 2.6* 2.6*    No results found for this or any previous visit (from the past 240 hour(s)).      Radiology Studies: MR BRAIN W WO CONTRAST  Result Date: 11/14/2020 CLINICAL DATA:  Brain mass or lesion.  Lung cancer. EXAM: MRI HEAD WITHOUT AND WITH CONTRAST TECHNIQUE: Multiplanar, multiecho pulse sequences of the brain and surrounding structures were obtained without and with intravenous contrast. CONTRAST:   8.79mL GADAVIST GADOBUTROL 1 MMOL/ML IV SOLN COMPARISON:  MRI August 24, 2020. FINDINGS: Brain: The previously described enhancing lesion in the high right frontal lobe is decreased in size and now 4 mm (see series 22, image 128 and series 21, image 16). A nearby, slightly more superior and anterior punctate enhancing lesion on the axial series in the high right frontal lobe (series 22, image 135) and a punctate enhancing lesion on the sagittal at the left frontoparietal junction (series 21, image 18) are not corroborated on other sequences. No other new enhancing lesions identified. The previously described plaque-like enhancement along the pons is significantly improved with mild residual enhancement (series 21, image 20) and FLAIR hyperintensity. No acute infarct, hemorrhage, hydrocephalus, or extra-axial fluid collection. Similar sequela of prior right MCA territory infarct with encephalomalacia and gliosis. Mild generalized cerebral volume loss. Vascular: Major arterial flow voids are maintained at the skull base. Skull and upper cervical spine: Normal marrow signal. Sinuses/Orbits: Mild sinus mucosal thickening without air-fluid levels. Unremarkable orbits. Other: Trace bilateral mastoid  effusions. IMPRESSION: 1. The previously described enhancing lesion in the high right frontal lobe is decreased in size, now 4 mm (6 mm on the prior). 2. The plaque-like enhancement along the pons is also improved with mild residual enhancement 3. There are two punctate new foci of enhancement in the right frontal lobe and the right frontoparietal region, which are only seen on one series. While these could be artifactual or vascular, they warrant attention on short interval follow-up. Electronically Signed   By: Margaretha Sheffield MD   On: 11/14/2020 12:18        Scheduled Meds: . atorvastatin  20 mg Oral Daily  . Chlorhexidine Gluconate Cloth  6 each Topical Daily  . cholecalciferol  2,000 Units Oral BID  .  dexamethasone  2 mg Oral Daily  . feeding supplement (GLUCERNA SHAKE)  237 mL Oral BID BM  . insulin aspart  0-15 Units Subcutaneous TID WC  . insulin aspart  0-5 Units Subcutaneous QHS  . insulin aspart  2 Units Subcutaneous TID WC  . insulin glargine  25 Units Subcutaneous BID  . midodrine  10 mg Oral TID WC  . multivitamin with minerals  1 tablet Oral Daily  . polyethylene glycol  17 g Oral Daily  . senna-docusate  1 tablet Oral BID  . sodium chloride flush  10-40 mL Intracatheter Q12H  . sodium chloride  1 g Oral q AM  . tamsulosin  0.4 mg Oral Daily  . warfarin  4 mg Oral q1600  . Warfarin - Pharmacist Dosing Inpatient   Does not apply q1600   Continuous Infusions: . lactated ringers 125 mL/hr at 11/15/20 0519     LOS: 16 days     Cordelia Poche, MD [facility] 11/15/2020, 2:12 PM  If 7PM-7AM, please contact night-coverage [email or web address]

## 2020-11-15 NOTE — Progress Notes (Signed)
Occupational Therapy Treatment Patient Details Name: Dennis [name]. MRN: [id number] DOB: May 04, 1961 Today's Date: 11/15/2020    History of present illness Patient is a 60 y/o male with PMH significant for IDDM, HTN, HLD, CVA in 2016, lung cancer, and CAD on warfarin. Patient had mechanical fall resulting in L femur fx. Patient s/p IM nail L femur on 1/19. Covid+ on precautions as of 10/31/19; off prec as of 1/31   OT comments  Pt making good progress with functional goals. OT will continue to follow acutely to maximize level of function and safety  Follow Up Recommendations  SNF    Equipment Recommendations  Other (comment);3 in 1 bedside commode (RW, TBD at next venue of care)    Recommendations for Other Services      Precautions / Restrictions Precautions Precautions: Fall Restrictions Weight Bearing Restrictions: Yes LLE Weight Bearing: Weight bearing as tolerated       Mobility Bed Mobility Overal bed mobility: Needs Assistance Bed Mobility: Supine to Sit     Supine to sit: Min guard;HOB elevated     General bed mobility comments: increased time and effort, used rail  Transfers Overall transfer level: Needs assistance Equipment used: Rolling walker (2 wheeled) Transfers: Sit to/from Stand Sit to Stand: Mod assist         General transfer comment: verbal cues for correct hand placement    Balance Overall balance assessment: Needs assistance Sitting-balance support: Feet supported;No upper extremity supported Sitting balance-Leahy Scale: Fair     Standing balance support: Bilateral upper extremity supported;During functional activity Standing balance-Leahy Scale: Poor Standing balance comment: pt required min A for balance/support standing at toilet with RW for posterior peri hygiene                           ADL either performed or assessed with clinical judgement   ADL Overall ADL's : Needs assistance/impaired     Grooming: Wash/dry  hands;Wash/dry face;Oral care;Min guard;Standing       Lower Body Bathing: Moderate assistance;Sitting/lateral leans Lower Body Bathing Details (indicate cue type and reason): simulated         Toilet Transfer: Moderate assistance;RW;Stand-pivot;Cueing for safety;Cueing for sequencing;Minimal assistance;Comfort height toilet;Grab bars;Ambulation   Toileting- Clothing Manipulation and Hygiene: Moderate assistance;Sit to/from stand       Functional mobility during ADLs: Moderate assistance;Minimal assistance;Rolling walker;Cueing for sequencing;Cueing for safety General ADL Comments: pt required min A for balance/support standing at toilet with RW for posterior peri hygiene     Vision Patient Visual Report: No change from baseline     Perception     Praxis      Cognition Arousal/Alertness: Awake/alert Behavior During Therapy: WFL for tasks assessed/performed Overall Cognitive Status: Within Functional Limits for tasks assessed                                          Exercises     Shoulder Instructions       General Comments      Pertinent Vitals/ Pain       Pain Assessment: Faces Faces Pain Scale: Hurts little more Pain Location: L LE in standing and with weight bearing Pain Descriptors / Indicators: Grimacing;Guarding Pain Intervention(s): Monitored during session;Repositioned  Home Living  Prior Functioning/Environment              Frequency  Min 2X/week        Progress Toward Goals  OT Goals(current goals can now be found in the care plan section)  Progress towards OT goals: Progressing toward goals  Acute Rehab OT Goals Patient Stated Goal: to go home  Plan Discharge plan remains appropriate    Co-evaluation                 AM-PAC OT "6 Clicks" Daily Activity     Outcome Measure   Help from another person eating meals?: None Help from another person  taking care of personal grooming?: A Little Help from another person toileting, which includes using toliet, bedpan, or urinal?: A Little Help from another person bathing (including washing, rinsing, drying)?: A Lot Help from another person to put on and taking off regular upper body clothing?: A Little Help from another person to put on and taking off regular lower body clothing?: A Lot 6 Click Score: 17    End of Session Equipment Utilized During Treatment: Gait belt;Rolling walker;Other (comment) (3 in 1 over toilet)  OT Visit Diagnosis: Unsteadiness on feet (R26.81);Other abnormalities of gait and mobility (R26.89);History of falling (Z91.81);Pain Pain - Right/Left: Left Pain - part of body: Leg   Activity Tolerance Patient tolerated treatment well   Patient Left in chair;with call bell/phone within reach;with chair alarm set   Nurse Communication Mobility status        Time: 7341-9379 OT Time Calculation (min): 26 min  Charges: OT General Charges $OT Visit: 1 Visit OT Treatments $Self Care/Home Management : 8-22 mins $Therapeutic Activity: 8-22 mins     Britt Bottom 11/15/2020, 2:15 PM

## 2020-11-16 DIAGNOSIS — C7931 Secondary malignant neoplasm of brain: Secondary | ICD-10-CM | POA: Diagnosis not present

## 2020-11-16 DIAGNOSIS — F1410 Cocaine abuse, uncomplicated: Secondary | ICD-10-CM | POA: Diagnosis not present

## 2020-11-16 DIAGNOSIS — S72402A Unspecified fracture of lower end of left femur, initial encounter for closed fracture: Secondary | ICD-10-CM | POA: Diagnosis not present

## 2020-11-16 DIAGNOSIS — Z95810 Presence of automatic (implantable) cardiac defibrillator: Secondary | ICD-10-CM | POA: Diagnosis not present

## 2020-11-16 LAB — BASIC METABOLIC PANEL
Anion gap: 9 (ref 5–15)
BUN: 19 mg/dL (ref 6–20)
CO2: 25 mmol/L (ref 22–32)
Calcium: 9.1 mg/dL (ref 8.9–10.3)
Chloride: 104 mmol/L (ref 98–111)
Creatinine, Ser: 1.04 mg/dL (ref 0.61–1.24)
GFR, Estimated: >60 mL/min (ref >60)
Glucose, Bld: 157 mg/dL — ABNORMAL HIGH (ref 70–99)
Potassium: 3.8 mmol/L (ref 3.5–5.1)
Sodium: 138 mmol/L (ref 135–145)

## 2020-11-16 LAB — GLUCOSE, CAPILLARY
Glucose-Capillary: 125 mg/dL — ABNORMAL HIGH (ref 70–99)
Glucose-Capillary: 140 mg/dL — ABNORMAL HIGH (ref 70–99)
Glucose-Capillary: 142 mg/dL — ABNORMAL HIGH (ref 70–99)
Glucose-Capillary: 152 mg/dL — ABNORMAL HIGH (ref 70–99)

## 2020-11-16 MED ORDER — POLYETHYLENE GLYCOL 3350 17 G PO PACK
17.0000 g | PACK | Freq: Two times a day (BID) | ORAL | Status: DC
  Administered 2020-11-16 – 2020-11-28 (×9): 17 g via ORAL
  Filled 2020-11-16 (×22): qty 1

## 2020-11-16 NOTE — Progress Notes (Signed)
Physical Therapy Treatment Patient Details Name: Dennis [name]. MRN: [id number] DOB: [birth year] Today's Date: 11/16/2020    History of Present Illness Patient is a 60 y/o male with PMH significant for IDDM, HTN, HLD, CVA in 2016, lung cancer, and CAD on warfarin. Patient had mechanical fall resulting in L femur fx. Patient s/p IM nail L femur on 1/19. Covid+ on precautions as of 10/31/19; off prec as of 1/31    PT Comments    Patient is making progress towards physical therapy goals. Patient motivated and in good mood this session. Patient ambulated 20' with RW and min guard, cues for RW management as patient tends to push too far out in front of him. Patient continues to be limited by decreased activity tolerance, impaired balance, generalized weakness, and impaired functional mobility. Continue to recommend SNF for ongoing Physical Therapy.       Follow Up Recommendations  SNF;Supervision/Assistance - 24 hour     Equipment Recommendations  Rolling [place] Nordmeyer with 5" wheels    Recommendations for Other Services       Precautions / Restrictions Precautions Precautions: Fall Restrictions Weight Bearing Restrictions: Yes LLE Weight Bearing: Weight bearing as tolerated    Mobility  Bed Mobility Overal bed mobility: Needs Assistance Bed Mobility: Supine to Sit     Supine to sit: Min guard;HOB elevated     General bed mobility comments: increased time and effort, used rail  Transfers Overall transfer level: Needs assistance Equipment used: Rolling Inez Stantz (2 wheeled) Transfers: Sit to/from Stand Sit to Stand: Min assist         General transfer comment: cues for hand placement. minA for boost up  Ambulation/Gait Ambulation/Gait assistance: Min guard Gait Distance (Feet): 20 Feet Assistive device: Rolling Colvin Blatt (2 wheeled) Gait Pattern/deviations: Step-to pattern;Antalgic;Wide base of support;Decreased stride length;Decreased stance time - left Gait velocity: decreased    General Gait Details: cues for RW management due to tendency to push RW too far out in front   Stairs             Wheelchair Mobility    Modified Rankin (Stroke Patients Only)       Balance Overall balance assessment: Needs assistance Sitting-balance support: Feet supported;No upper extremity supported Sitting balance-Leahy Scale: Fair Sitting balance - Comments: performed UB and LB dressing sitting EOB   Standing balance support: Bilateral upper extremity supported;During functional activity Standing balance-Leahy Scale: Poor Standing balance comment: reliant on UE support                            Cognition Arousal/Alertness: Awake/alert Behavior During Therapy: WFL for tasks assessed/performed Overall Cognitive Status: Within Functional Limits for tasks assessed                                 General Comments: provided wash cloths to clean buttocks and patient had been incontinent and stated "It took me 2 days to have that"      Exercises General Exercises - Lower Extremity Long Arc Quad: Both;10 reps;Seated Hip Flexion/Marching: Both;10 reps;Seated    General Comments        Pertinent Vitals/Pain Pain Assessment: Faces Faces Pain Scale: No hurt    Home Living                      Prior Function  PT Goals (current goals can now be found in the care plan section) Acute Rehab PT Goals Patient Stated Goal: to go home PT Goal Formulation: With patient Time For Goal Achievement: 11/28/20 Potential to Achieve Goals: Fair Progress towards PT goals: Progressing toward goals    Frequency    Min 3X/week      PT Plan Current plan remains appropriate    Co-evaluation              AM-PAC PT "6 Clicks" Mobility   Outcome Measure  Help needed turning from your back to your side while in a flat bed without using bedrails?: A Little Help needed moving from lying on your back to sitting on the side  of a flat bed without using bedrails?: A Little Help needed moving to and from a bed to a chair (including a wheelchair)?: A Little Help needed standing up from a chair using your arms (e.g., wheelchair or bedside chair)?: A Little Help needed to walk in hospital room?: A Little Help needed climbing 3-5 steps with a railing? : A Lot 6 Click Score: 17    End of Session Equipment Utilized During Treatment: Gait belt Activity Tolerance: Patient tolerated treatment well Patient left: in chair;with call bell/phone within reach;with chair alarm set Nurse Communication: Mobility status PT Visit Diagnosis: Unsteadiness on feet (R26.81);Other abnormalities of gait and mobility (R26.89);Muscle weakness (generalized) (M62.81);History of falling (Z91.81)     Time: 0569-7948 PT Time Calculation (min) (ACUTE ONLY): 21 min  Charges:  $Therapeutic Activity: 8-22 mins                     Cherry [place] A. Gilford Rile PT, DPT Acute Rehabilitation Services Pager [phone] Office [phone]    Alda [name] 11/16/2020, 12:25 PM

## 2020-11-16 NOTE — Progress Notes (Signed)
PROGRESS NOTE    Dennis [name].  TWS:[id number] DOB: January 20, 1961 DOA: 10/30/2020 PCP: [name], MD   Brief Narrative: Dennis [name]. is a 60 y.o. male with a history of systolic CHF last EF 00-17%,C/BSWHQ, CAD, right MCA CVAin 06/2020,on Coumadin,small cell lung cancer with mets to brain (dx3/2021),DM type II, cocaine abuse, and remote alcohol abuse patient presented secondary to slipping and falling on ice resulting in a left distal femur fracture. He was found to be incidentally COVID-19 positive. He underwent repair and has been waiting for a SNF bed.   Assessment & Plan:   Principal Problem:   Femoral distal fracture (HCC) Active Problems:   Automatic implantable cardioverter-defibrillator in situ   Chronic systolic heart failure (HCC)   Cocaine abuse ([place])   Dysphagia   History of CVA with residual deficit   Uncontrolled type 2 diabetes mellitus with hyperglycemia, with long-term current use of insulin (HCC)   Orthostatic hypotension   Brain metastases ([place])   Fall   COVID-19 virus infection   Distal left femur fracture Secondary to fall. Orthopedic surgery consulted and patient underwent intramedullary femoral nailing on 10/31/2020. Plan for SNF on discharge per PT/OT recommendations.  COVID-19 infection Patient treated with Remdesivir x3 doses. Patient was previously on Decadron which was initially started at a higher dose and titrated down.   Acute respiratory failure with hypoxia Initial concern for possible heart failure for which he received one dose of IV lasix but possibly more related to COVID-19 infection. Hypoxia quickly resolved.  Chronic systolic heart failure S/p AICD Patient with an EF of 25-30%. Currently stable volume status. On Coumadin. Per cardiology, patient's AICD is nearing ERI; this will be addressed as an outpatient in light of patient's cancer. -Continue Coumadin  Anemia of chronic disease Acute blood loss anemia Drop in  hemoglobin secondary to surgery/fracture. Stable.  Constipation -Increase to Miralax BID  Orthostatic hypotension Stable. Patient is on midodrine and salt tablets as an outpatient in light of underlying reduced EF heart failure. -Continue midodrine and salt tablet  Diabetes mellitus, type 2 -Continue Lantus 25 units BID and Novolog 2 units with meals and SSI  AKI Baseline creatinine of 0.9-1. Creatinine increased to 1.5 which improved with Lasix administration. Stable.  Dysphagia Speech therapy evaluated with evidence of mild oropharyngeal dysphagia in setting of prior stroke. On a regular consistency diet.  History of stroke Residual dysarthria/left sided weakness. On coumadin -Continue Coumadin  History of cocaine abuse Continues to use cocaine. UDS on 1/18 is positive for cocaine.  Small cell lung cancer Brain metastasis Patient follows with Dr. Mickeal Skinner and Dr. Julien Nordmann as an outpatient. He is s/p whole brain radiation and chemotherapy. Currently on dexamethasone. MRI on 2/2 significant for two new punctate foci of enhancement in right frontal lobe/right frontoparietal region which may or may not be related to artifact; recommendation for interval follow-up.   DVT prophylaxis: Coumadin Code Status:   Code Status: Full Code Family Communication: None at bedside Disposition Plan: Discharge to SNF when bed is available. Medically stable.   Consultants:   Cardiology  Neurooncology  Orthopedic surgery  Procedures:   IM NAILING  Antimicrobials:  Cefazolin    Subjective: No concerns. Bowel movement every two days.  Objective: Vitals:   11/15/20 2116 11/16/20 0349 11/16/20 0500 11/16/20 0815  BP: (!) 115/99 112/83  104/81  Pulse: 97 93  92  Resp: 18     Temp: 97.9 F (36.6 C) 97.9 F (36.6 C)  98.5 F (  36.9 C)  TempSrc: Oral Oral  Oral  SpO2: 93% 99%  93%  Weight:   86.5 kg   Height:        Intake/Output Summary (Last 24 hours) at 11/16/2020 0854 Last  data filed at 11/15/2020 1621 Gross per 24 hour  Intake -  Output 600 ml  Net -600 ml   Filed Weights   11/14/20 0500 11/15/20 0500 11/16/20 0500  Weight: 84.1 kg 86.5 kg 86.5 kg    Examination:  General exam: Appears calm and comfortable Respiratory system: Clear to auscultation. Respiratory effort normal. Cardiovascular system: S1 & S2 heard, RRR. No murmurs, rubs, gallops or clicks. Gastrointestinal system: Abdomen is distended, soft and nontender. No organomegaly or masses felt. Normal bowel sounds heard. Central nervous system: Alert and oriented. No focal neurological deficits. Musculoskeletal: No edema. No calf tenderness Skin: No cyanosis. No rashes Psychiatry: Judgement and insight appear normal. Mood & affect appropriate.     Data Reviewed: I have personally reviewed following labs and imaging studies  CBC Lab Results  Component Value Date   WBC 11.4 (H) 11/15/2020   RBC 2.90 (L) 11/15/2020   HGB 9.1 (L) 11/15/2020   HCT 29.2 (L) 11/15/2020   MCV 100.7 (H) 11/15/2020   MCH 31.4 11/15/2020   PLT 213 11/15/2020   MCHC 31.2 11/15/2020   RDW 15.9 (H) 11/15/2020   LYMPHSABS 2.3 11/04/2020   MONOABS 1.4 (H) 11/04/2020   EOSABS 0.1 11/04/2020   BASOSABS 0.0 56/31/4970     Last metabolic panel Lab Results  Component Value Date   NA 138 11/16/2020   K 3.8 11/16/2020   CL 104 11/16/2020   CO2 25 11/16/2020   BUN 19 11/16/2020   CREATININE 1.04 11/16/2020   GLUCOSE 157 (H) 11/16/2020   GFRNONAA >60 11/16/2020   GFRAA >60 07/16/2020   CALCIUM 9.1 11/16/2020   PHOS 3.2 11/03/2020   PROT 5.2 (L) 11/04/2020   ALBUMIN 2.2 (L) 11/04/2020   BILITOT 0.7 11/04/2020   ALKPHOS 78 11/04/2020   AST 21 11/04/2020   ALT 14 11/04/2020   ANIONGAP 9 11/16/2020    CBG (last 3)  Recent Labs    11/15/20 1112 11/15/20 1613 11/16/20 0642  GLUCAP 158* 154* 125*     GFR: Estimated Creatinine Clearance: 85 mL/min (by C-G formula based on SCr of 1.04  mg/dL).  Coagulation Profile: Recent Labs  Lab 11/11/20 0335 11/12/20 0358 11/13/20 0301 11/14/20 0400 11/15/20 0434  INR 2.2* 2.3* 2.5* 2.6* 2.6*    No results found for this or any previous visit (from the past 240 hour(s)).      Radiology Studies: MR BRAIN W WO CONTRAST  Result Date: 11/14/2020 CLINICAL DATA:  Brain mass or lesion.  Lung cancer. EXAM: MRI HEAD WITHOUT AND WITH CONTRAST TECHNIQUE: Multiplanar, multiecho pulse sequences of the brain and surrounding structures were obtained without and with intravenous contrast. CONTRAST:  8.72mL GADAVIST GADOBUTROL 1 MMOL/ML IV SOLN COMPARISON:  MRI August 24, 2020. FINDINGS: Brain: The previously described enhancing lesion in the high right frontal lobe is decreased in size and now 4 mm (see series 22, image 128 and series 21, image 16). A nearby, slightly more superior and anterior punctate enhancing lesion on the axial series in the high right frontal lobe (series 22, image 135) and a punctate enhancing lesion on the sagittal at the left frontoparietal junction (series 21, image 18) are not corroborated on other sequences. No other new enhancing lesions identified. The previously described plaque-like enhancement  along the pons is significantly improved with mild residual enhancement (series 21, image 20) and FLAIR hyperintensity. No acute infarct, hemorrhage, hydrocephalus, or extra-axial fluid collection. Similar sequela of prior right MCA territory infarct with encephalomalacia and gliosis. Mild generalized cerebral volume loss. Vascular: Major arterial flow voids are maintained at the skull base. Skull and upper cervical spine: Normal marrow signal. Sinuses/Orbits: Mild sinus mucosal thickening without air-fluid levels. Unremarkable orbits. Other: Trace bilateral mastoid effusions. IMPRESSION: 1. The previously described enhancing lesion in the high right frontal lobe is decreased in size, now 4 mm (6 mm on the prior). 2. The plaque-like  enhancement along the pons is also improved with mild residual enhancement 3. There are two punctate new foci of enhancement in the right frontal lobe and the right frontoparietal region, which are only seen on one series. While these could be artifactual or vascular, they warrant attention on short interval follow-up. Electronically Signed   By: Margaretha Sheffield MD   On: 11/14/2020 12:18        Scheduled Meds: . atorvastatin  20 mg Oral Daily  . Chlorhexidine Gluconate Cloth  6 each Topical Daily  . cholecalciferol  2,000 Units Oral BID  . dexamethasone  2 mg Oral Daily  . feeding supplement (GLUCERNA SHAKE)  237 mL Oral BID BM  . insulin aspart  0-15 Units Subcutaneous TID WC  . insulin aspart  0-5 Units Subcutaneous QHS  . insulin aspart  2 Units Subcutaneous TID WC  . insulin glargine  25 Units Subcutaneous BID  . midodrine  10 mg Oral TID WC  . multivitamin with minerals  1 tablet Oral Daily  . polyethylene glycol  17 g Oral BID  . senna-docusate  1 tablet Oral BID  . sodium chloride flush  10-40 mL Intracatheter Q12H  . sodium chloride  1 g Oral q AM  . tamsulosin  0.4 mg Oral Daily  . warfarin  4 mg Oral q1600  . Warfarin - Pharmacist Dosing Inpatient   Does not apply q1600   Continuous Infusions:    LOS: 17 days     Cordelia Poche, MD [facility] 11/16/2020, 8:54 AM  If 7PM-7AM, please contact night-coverage [email or web address]

## 2020-11-17 DIAGNOSIS — S72402A Unspecified fracture of lower end of left femur, initial encounter for closed fracture: Secondary | ICD-10-CM | POA: Diagnosis not present

## 2020-11-17 LAB — GLUCOSE, CAPILLARY
Glucose-Capillary: 95 mg/dL (ref 70–99)
Glucose-Capillary: 160 mg/dL — ABNORMAL HIGH (ref 70–99)
Glucose-Capillary: 168 mg/dL — ABNORMAL HIGH (ref 70–99)
Glucose-Capillary: 214 mg/dL — ABNORMAL HIGH (ref 70–99)

## 2020-11-17 LAB — BASIC METABOLIC PANEL
Anion gap: 9 (ref 5–15)
BUN: 20 mg/dL (ref 6–20)
CO2: 26 mmol/L (ref 22–32)
Calcium: 8.6 mg/dL — ABNORMAL LOW (ref 8.9–10.3)
Chloride: 105 mmol/L (ref 98–111)
Creatinine, Ser: 1.06 mg/dL (ref 0.61–1.24)
GFR, Estimated: >60 mL/min (ref >60)
Glucose, Bld: 145 mg/dL — ABNORMAL HIGH (ref 70–99)
Potassium: 3.7 mmol/L (ref 3.5–5.1)
Sodium: 140 mmol/L (ref 135–145)

## 2020-11-17 NOTE — Progress Notes (Signed)
PROGRESS NOTE    Dennis [name].  ZES:[id number] DOB: [birth year] DOA: 10/30/2020 PCP: [name], MD   Brief Narrative: Dennis [name]. is a 60 y.o. male with a history of systolic CHF last EF 26-33%,H/LKTGY, CAD, right MCA CVAin 06/2020,on Coumadin,small cell lung cancer with mets to brain (dx3/2021),DM type II, cocaine abuse, and remote alcohol abuse patient presented secondary to slipping and falling on ice resulting in a left distal femur fracture. He was found to be incidentally COVID-19 positive. He underwent repair and has been waiting for a SNF bed.   Assessment & Plan:   Principal Problem:   Femoral distal fracture (HCC) Active Problems:   Automatic implantable cardioverter-defibrillator in situ   Chronic systolic heart failure (HCC)   Cocaine abuse ([place])   Dysphagia   History of CVA with residual deficit   Uncontrolled type 2 diabetes mellitus with hyperglycemia, with long-term current use of insulin (HCC)   Orthostatic hypotension   Brain metastases ([place])   Fall   COVID-19 virus infection   Distal left femur fracture Secondary to fall. Orthopedic surgery consulted and patient underwent intramedullary femoral nailing on 10/31/2020. Plan for SNF on discharge per PT/OT recommendations.  COVID-19 infection Patient treated with Remdesivir x3 doses. Patient was previously on Decadron which was initially started at a higher dose and titrated down.   Acute respiratory failure with hypoxia Initial concern for possible heart failure for which he received one dose of IV lasix but possibly more related to COVID-19 infection. Hypoxia quickly resolved.  Chronic systolic heart failure S/p AICD Patient with an EF of 25-30%. Currently stable volume status. On Coumadin. Per cardiology, patient's AICD is nearing ERI; this will be addressed as an outpatient in light of patient's cancer. -Continue Coumadin  Anemia of chronic disease Acute blood loss anemia Drop in  hemoglobin secondary to surgery/fracture. Stable.  Constipation -Continue Miralax BID  Orthostatic hypotension Stable. Patient is on midodrine and salt tablets as an outpatient in light of underlying reduced EF heart failure. -Continue midodrine and salt tablet  Diabetes mellitus, type 2 -Continue Lantus 25 units BID and Novolog 2 units with meals and SSI  AKI Baseline creatinine of 0.9-1. Creatinine increased to 1.5 which improved with Lasix administration. Stable.  Dysphagia Speech therapy evaluated with evidence of mild oropharyngeal dysphagia in setting of prior stroke. On a regular consistency diet.  History of stroke Residual dysarthria/left sided weakness. On coumadin -Continue Coumadin  History of cocaine abuse Continues to use cocaine. UDS on 1/18 is positive for cocaine.  Small cell lung cancer Brain metastasis Patient follows with Dr. Mickeal Skinner and Dr. Julien Nordmann as an outpatient. He is s/p whole brain radiation and chemotherapy. Currently on dexamethasone. MRI on 2/2 significant for two new punctate foci of enhancement in right frontal lobe/right frontoparietal region which may or may not be related to artifact; recommendation for interval follow-up.   DVT prophylaxis: Coumadin Code Status:   Code Status: Full Code Family Communication: None at bedside Disposition Plan: Discharge to SNF when bed is available. Medically stable.   Consultants:   Cardiology  Neurooncology  Orthopedic surgery  Procedures:   IM NAILING  Antimicrobials:  Cefazolin    Subjective: No issues noted overnight.  Objective: Vitals:   11/16/20 2002 11/17/20 0432 11/17/20 0500 11/17/20 0916  BP: (!) 126/93 110/89  100/81  Pulse: (!) 101 98  93  Resp: 16 16  16   Temp: 98.2 F (36.8 C) 98 F (36.7 C)  98.4 F (36.9 C)  TempSrc: Oral   Oral  SpO2: 100% 95%  100%  Weight:   88.6 kg   Height:        Intake/Output Summary (Last 24 hours) at 11/17/2020 0945 Last data filed at  11/17/2020 0900 Gross per 24 hour  Intake -  Output 1700 ml  Net -1700 ml   Filed Weights   11/15/20 0500 11/16/20 0500 11/17/20 0500  Weight: 86.5 kg 86.5 kg 88.6 kg    Examination:  General: Well appearing, no distress   Data Reviewed: I have personally reviewed following labs and imaging studies  CBC Lab Results  Component Value Date   WBC 11.4 (H) 11/15/2020   RBC 2.90 (L) 11/15/2020   HGB 9.1 (L) 11/15/2020   HCT 29.2 (L) 11/15/2020   MCV 100.7 (H) 11/15/2020   MCH 31.4 11/15/2020   PLT 213 11/15/2020   MCHC 31.2 11/15/2020   RDW 15.9 (H) 11/15/2020   LYMPHSABS 2.3 11/04/2020   MONOABS 1.4 (H) 11/04/2020   EOSABS 0.1 11/04/2020   BASOSABS 0.0 26/37/8588     Last metabolic panel Lab Results  Component Value Date   NA 140 11/17/2020   K 3.7 11/17/2020   CL 105 11/17/2020   CO2 26 11/17/2020   BUN 20 11/17/2020   CREATININE 1.06 11/17/2020   GLUCOSE 145 (H) 11/17/2020   GFRNONAA >60 11/17/2020   GFRAA >60 07/16/2020   CALCIUM 8.6 (L) 11/17/2020   PHOS 3.2 11/03/2020   PROT 5.2 (L) 11/04/2020   ALBUMIN 2.2 (L) 11/04/2020   BILITOT 0.7 11/04/2020   ALKPHOS 78 11/04/2020   AST 21 11/04/2020   ALT 14 11/04/2020   ANIONGAP 9 11/17/2020    CBG (last 3)  Recent Labs    11/16/20 1654 11/16/20 2049 11/17/20 0652  GLUCAP 142* 152* 95     GFR: Estimated Creatinine Clearance: 83.4 mL/min (by C-G formula based on SCr of 1.06 mg/dL).  Coagulation Profile: Recent Labs  Lab 11/11/20 0335 11/12/20 0358 11/13/20 0301 11/14/20 0400 11/15/20 0434  INR 2.2* 2.3* 2.5* 2.6* 2.6*    No results found for this or any previous visit (from the past 240 hour(s)).      Radiology Studies: No results found.      Scheduled Meds: . atorvastatin  20 mg Oral Daily  . Chlorhexidine Gluconate Cloth  6 each Topical Daily  . cholecalciferol  2,000 Units Oral BID  . dexamethasone  2 mg Oral Daily  . feeding supplement (GLUCERNA SHAKE)  237 mL Oral BID BM  .  insulin aspart  0-15 Units Subcutaneous TID WC  . insulin aspart  0-5 Units Subcutaneous QHS  . insulin aspart  2 Units Subcutaneous TID WC  . insulin glargine  25 Units Subcutaneous BID  . midodrine  10 mg Oral TID WC  . multivitamin with minerals  1 tablet Oral Daily  . polyethylene glycol  17 g Oral BID  . senna-docusate  1 tablet Oral BID  . sodium chloride flush  10-40 mL Intracatheter Q12H  . sodium chloride  1 g Oral q AM  . tamsulosin  0.4 mg Oral Daily  . warfarin  4 mg Oral q1600  . Warfarin - Pharmacist Dosing Inpatient   Does not apply q1600   Continuous Infusions:    LOS: 18 days     Cordelia Poche, MD [facility] 11/17/2020, 9:45 AM  If 7PM-7AM, please contact night-coverage [email or web address]

## 2020-11-18 DIAGNOSIS — S72402A Unspecified fracture of lower end of left femur, initial encounter for closed fracture: Secondary | ICD-10-CM | POA: Diagnosis not present

## 2020-11-18 DIAGNOSIS — F1410 Cocaine abuse, uncomplicated: Secondary | ICD-10-CM | POA: Diagnosis not present

## 2020-11-18 DIAGNOSIS — Z95810 Presence of automatic (implantable) cardiac defibrillator: Secondary | ICD-10-CM | POA: Diagnosis not present

## 2020-11-18 DIAGNOSIS — C7931 Secondary malignant neoplasm of brain: Secondary | ICD-10-CM | POA: Diagnosis not present

## 2020-11-18 LAB — GLUCOSE, CAPILLARY
Glucose-Capillary: 178 mg/dL — ABNORMAL HIGH (ref 70–99)
Glucose-Capillary: 188 mg/dL — ABNORMAL HIGH (ref 70–99)
Glucose-Capillary: 172 mg/dL — ABNORMAL HIGH (ref 70–99)
Glucose-Capillary: 174 mg/dL — ABNORMAL HIGH (ref 70–99)

## 2020-11-18 NOTE — Progress Notes (Signed)
Physical Therapy Treatment Patient Details Name: Dennis [name]. MRN: [id number] DOB: 07/15/1961 Today's Date: 11/18/2020    History of Present Illness Patient is a 60 y/o male with PMH significant for IDDM, HTN, HLD, CVA in 2016, lung cancer, and CAD on warfarin. Patient had mechanical fall resulting in L femur fx. Patient s/p IM nail L femur on 1/19. Covid+ on precautions as of 10/31/19; off prec as of 1/31    PT Comments    Continuing work on functional mobility and activity tolerance;  Able to walk out of his room into the hallway; pt seemed quite pleased   Follow Up Recommendations  SNF;Supervision/Assistance - 24 hour     Equipment Recommendations  Rolling walker with 5" wheels    Recommendations for Other Services       Precautions / Restrictions Precautions Precautions: Fall Restrictions LLE Weight Bearing: Weight bearing as tolerated    Mobility  Bed Mobility Overal bed mobility: Needs Assistance Bed Mobility: Supine to Sit     Supine to sit: Min guard;HOB elevated     General bed mobility comments: increased time and effort, used rail  Transfers Overall transfer level: Needs assistance Equipment used: Rolling walker (2 wheeled) Transfers: Sit to/from [facility] Sit to Stand: Min assist Stand pivot transfers: Min assist       General transfer comment: cues for hand placement. minA for boost up  Ambulation/Gait Ambulation/Gait assistance: Min guard Gait Distance (Feet): 40 Feet Assistive device: Rolling walker (2 wheeled) Gait Pattern/deviations: Step-to pattern;Antalgic;Wide base of support;Decreased stride length;Decreased stance time - left     General Gait Details: cues for RW management due to tendency to push RW too far out in front   Stairs             Wheelchair Mobility    Modified Rankin (Stroke Patients Only)       Balance                                            Cognition  Arousal/Alertness: Awake/alert Behavior During Therapy: WFL for tasks assessed/performed Overall Cognitive Status: Within Functional Limits for tasks assessed                                        Exercises      General Comments General comments (skin integrity, edema, etc.): Opted to get up to [facility] before progressive amb, and pt was able to move his bowels; moderately sized and formed stool      Pertinent Vitals/Pain Pain Assessment: Faces Faces Pain Scale: Hurts a little bit Pain Location: L LE in standing and with weight bearing Pain Descriptors / Indicators: Grimacing;Guarding Pain Intervention(s): Monitored during session    Home Living                      Prior Function            PT Goals (current goals can now be found in the care plan section) Acute Rehab PT Goals Patient Stated Goal: to go home PT Goal Formulation: With patient Time For Goal Achievement: 11/28/20 Potential to Achieve Goals: Fair Progress towards PT goals: Progressing toward goals    Frequency    Min 3X/week      PT Plan Current  plan remains appropriate    Co-evaluation              AM-PAC PT "6 Clicks" Mobility   Outcome Measure  Help needed turning from your back to your side while in a flat bed without using bedrails?: A Little Help needed moving from lying on your back to sitting on the side of a flat bed without using bedrails?: A Little Help needed moving to and from a bed to a chair (including a wheelchair)?: A Little Help needed standing up from a chair using your arms (e.g., wheelchair or bedside chair)?: A Little Help needed to walk in hospital room?: A Little Help needed climbing 3-5 steps with a railing? : A Lot 6 Click Score: 17    End of Session Equipment Utilized During Treatment: Gait belt Activity Tolerance: Patient tolerated treatment well Patient left: in chair;with call bell/phone within reach;with chair alarm set Nurse  Communication: Mobility status PT Visit Diagnosis: Unsteadiness on feet (R26.81);Other abnormalities of gait and mobility (R26.89);Muscle weakness (generalized) (M62.81);History of falling (Z91.81)     Time: 3437-3578 PT Time Calculation (min) (ACUTE ONLY): 27 min  Charges:  $Gait Training: 8-22 mins $Therapeutic Activity: 8-22 mins                     Roney Marion, PT  Acute Rehabilitation Services Pager [phone] Office [place] 11/18/2020, 5:29 PM

## 2020-11-18 NOTE — Progress Notes (Signed)
PROGRESS NOTE    Dennis [name].  VOH:[id number] DOB: [birth year] DOA: 10/30/2020 PCP: [name], MD   Brief Narrative: Dennis [name]. is a 60 y.o. male with a history of systolic CHF last EF 69-48%,N/IOEVO, CAD, right MCA CVAin 06/2020,on Coumadin,small cell lung cancer with mets to brain (dx3/2021),DM type II, cocaine abuse, and remote alcohol abuse patient presented secondary to slipping and falling on ice resulting in a left distal femur fracture. He was found to be incidentally COVID-19 positive. He underwent repair and has been waiting for a SNF bed.   Assessment & Plan:   Principal Problem:   Femoral distal fracture (HCC) Active Problems:   Automatic implantable cardioverter-defibrillator in situ   Chronic systolic heart failure (HCC)   Cocaine abuse ([place])   Dysphagia   History of CVA with residual deficit   Uncontrolled type 2 diabetes mellitus with hyperglycemia, with long-term current use of insulin (HCC)   Orthostatic hypotension   Brain metastases ([place])   Fall   COVID-19 virus infection   Distal left femur fracture Secondary to fall. Orthopedic surgery consulted and patient underwent intramedullary femoral nailing on 10/31/2020. WBAT. Plan for SNF on discharge per PT/OT recommendations. -Out of bed today  COVID-19 infection Patient treated with Remdesivir x3 doses. Patient was previously on Decadron which was initially started at a higher dose and titrated down.   Acute respiratory failure with hypoxia Initial concern for possible heart failure for which he received one dose of IV lasix but possibly more related to COVID-19 infection. Hypoxia quickly resolved.  Chronic systolic heart failure S/p AICD Patient with an EF of 25-30%. Currently stable volume status. On Coumadin. Per cardiology, patient's AICD is nearing ERI; this will be addressed as an outpatient in light of patient's cancer. -Continue Coumadin  Anemia of chronic disease Acute blood loss  anemia Drop in hemoglobin secondary to surgery/fracture. Stable.  Constipation -Continue Miralax BID  Orthostatic hypotension Stable. Patient is on midodrine and salt tablets as an outpatient in light of underlying reduced EF heart failure. -Continue midodrine and salt tablet  Diabetes mellitus, type 2 -Continue Lantus 25 units BID and Novolog 2 units with meals and SSI  AKI Baseline creatinine of 0.9-1. Creatinine increased to 1.5 which improved with Lasix administration. Stable.  Dysphagia Speech therapy evaluated with evidence of mild oropharyngeal dysphagia in setting of prior stroke. On a regular consistency diet.  History of stroke Residual dysarthria/left sided weakness. On coumadin -Continue Coumadin  History of cocaine abuse Continues to use cocaine. UDS on 1/18 is positive for cocaine.  Small cell lung cancer Brain metastasis Patient follows with Dr. Mickeal Skinner and Dr. Julien Nordmann as an outpatient. He is s/p whole brain radiation and chemotherapy. Currently on dexamethasone. MRI on 2/2 significant for two new punctate foci of enhancement in right frontal lobe/right frontoparietal region which may or may not be related to artifact; recommendation for interval follow-up.   DVT prophylaxis: Coumadin Code Status:   Code Status: Full Code Family Communication: None at bedside Disposition Plan: Discharge to SNF when bed is available. Medically stable.   Consultants:   Cardiology  Neurooncology  Orthopedic surgery  Procedures:   IM NAILING  Antimicrobials:  Cefazolin    Subjective: Wheezing. No issues overnight. Did not get out of bed yesterday.  Objective: Vitals:   11/17/20 0916 11/17/20 1934 11/18/20 0408 11/18/20 0500  BP: 100/81 115/89 112/82   Pulse: 93 98 (!) 103   Resp: 16 16 16    Temp: 98.4 F (36.9  C) 98 F (36.7 C) 98.8 F (37.1 C)   TempSrc: Oral Oral Oral   SpO2: 100% 95% 96%   Weight:    88.6 kg  Height:        Intake/Output Summary  (Last 24 hours) at 11/18/2020 0858 Last data filed at 11/17/2020 0900 Gross per 24 hour  Intake -  Output 200 ml  Net -200 ml   Filed Weights   11/16/20 0500 11/17/20 0500 11/18/20 0500  Weight: 86.5 kg 88.6 kg 88.6 kg    Examination:  General exam: Appears calm and comfortable Respiratory system: Clear to auscultation. Respiratory effort normal. Cardiovascular system: S1 & S2 heard, RRR. No murmurs, rubs, gallops or clicks. Gastrointestinal system: Abdomen is nondistended, soft and nontender. No organomegaly or masses felt. Normal bowel sounds heard. Central nervous system: Alert and oriented. No focal neurological deficits. Musculoskeletal: No edema. No calf tenderness Skin: No cyanosis. No rashes Psychiatry: Judgement and insight appear normal. Mood & affect appropriate.    Data Reviewed: I have personally reviewed following labs and imaging studies  CBC Lab Results  Component Value Date   WBC 11.4 (H) 11/15/2020   RBC 2.90 (L) 11/15/2020   HGB 9.1 (L) 11/15/2020   HCT 29.2 (L) 11/15/2020   MCV 100.7 (H) 11/15/2020   MCH 31.4 11/15/2020   PLT 213 11/15/2020   MCHC 31.2 11/15/2020   RDW 15.9 (H) 11/15/2020   LYMPHSABS 2.3 11/04/2020   MONOABS 1.4 (H) 11/04/2020   EOSABS 0.1 11/04/2020   BASOSABS 0.0 18/29/9371     Last metabolic panel Lab Results  Component Value Date   NA 140 11/17/2020   K 3.7 11/17/2020   CL 105 11/17/2020   CO2 26 11/17/2020   BUN 20 11/17/2020   CREATININE 1.06 11/17/2020   GLUCOSE 145 (H) 11/17/2020   GFRNONAA >60 11/17/2020   GFRAA >60 07/16/2020   CALCIUM 8.6 (L) 11/17/2020   PHOS 3.2 11/03/2020   PROT 5.2 (L) 11/04/2020   ALBUMIN 2.2 (L) 11/04/2020   BILITOT 0.7 11/04/2020   ALKPHOS 78 11/04/2020   AST 21 11/04/2020   ALT 14 11/04/2020   ANIONGAP 9 11/17/2020    CBG (last 3)  Recent Labs    11/17/20 1609 11/17/20 2109 11/18/20 0656  GLUCAP 168* 214* 178*     GFR: Estimated Creatinine Clearance: 83.4 mL/min (by C-G  formula based on SCr of 1.06 mg/dL).  Coagulation Profile: Recent Labs  Lab 11/12/20 0358 11/13/20 0301 11/14/20 0400 11/15/20 0434  INR 2.3* 2.5* 2.6* 2.6*    No results found for this or any previous visit (from the past 240 hour(s)).      Radiology Studies: No results found.      Scheduled Meds: . atorvastatin  20 mg Oral Daily  . Chlorhexidine Gluconate Cloth  6 each Topical Daily  . cholecalciferol  2,000 Units Oral BID  . dexamethasone  2 mg Oral Daily  . feeding supplement (GLUCERNA SHAKE)  237 mL Oral BID BM  . insulin aspart  0-15 Units Subcutaneous TID WC  . insulin aspart  0-5 Units Subcutaneous QHS  . insulin aspart  2 Units Subcutaneous TID WC  . insulin glargine  25 Units Subcutaneous BID  . midodrine  10 mg Oral TID WC  . multivitamin with minerals  1 tablet Oral Daily  . polyethylene glycol  17 g Oral BID  . senna-docusate  1 tablet Oral BID  . sodium chloride flush  10-40 mL Intracatheter Q12H  . sodium chloride  1 g Oral q AM  . tamsulosin  0.4 mg Oral Daily  . warfarin  4 mg Oral q1600  . Warfarin - Pharmacist Dosing Inpatient   Does not apply q1600   Continuous Infusions:    LOS: 19 days     Cordelia Poche, MD [facility] 11/18/2020, 8:58 AM  If 7PM-7AM, please contact night-coverage [email or web address]

## 2020-11-18 NOTE — TOC Progression Note (Signed)
Transition of Care (TOC) - Progression Note    Patient Details  Name: Dennis [name]. MRN: [id number] Date of Birth: 04/09/1961  Transition of Care [facility]) CM/SW [place], [place] Phone Number: 11/18/2020, 9:19 AM  Clinical Narrative:     Pt does not have any bed offers at this time. TOC will continue to monitor.   Expected Discharge Plan: Skilled Nursing Facility Barriers to Discharge: Continued Medical Work up,No SNF bed  Expected Discharge Plan and Services Expected Discharge Plan: [place] Choice: [place] arrangements for the past 2 months: Single Family Home Expected Discharge Date: 11/06/20                                     Social Determinants of Health (SDOH) Interventions    Readmission Risk Interventions No flowsheet data found.  Emeterio Reeve, Latanya Presser, [place] Social Worker [phone]

## 2020-11-19 ENCOUNTER — Inpatient Hospital Stay: Payer: Medicare Other | Attending: Internal Medicine

## 2020-11-19 DIAGNOSIS — S72402A Unspecified fracture of lower end of left femur, initial encounter for closed fracture: Secondary | ICD-10-CM | POA: Diagnosis not present

## 2020-11-19 LAB — CBC
HCT: 31.7 % — ABNORMAL LOW (ref 39.0–52.0)
Hemoglobin: 9.7 g/dL — ABNORMAL LOW (ref 13.0–17.0)
MCH: 31.3 pg (ref 26.0–34.0)
MCHC: 30.6 g/dL (ref 30.0–36.0)
MCV: 102.3 fL — ABNORMAL HIGH (ref 80.0–100.0)
Platelets: 221 10*3/uL (ref 150–400)
RBC: 3.1 MIL/uL — ABNORMAL LOW (ref 4.22–5.81)
RDW: 16.4 % — ABNORMAL HIGH (ref 11.5–15.5)
WBC: 11.7 10*3/uL — ABNORMAL HIGH (ref 4.0–10.5)
nRBC: 0 % (ref 0.0–0.2)

## 2020-11-19 LAB — GLUCOSE, CAPILLARY
Glucose-Capillary: 150 mg/dL — ABNORMAL HIGH (ref 70–99)
Glucose-Capillary: 132 mg/dL — ABNORMAL HIGH (ref 70–99)
Glucose-Capillary: 83 mg/dL (ref 70–99)
Glucose-Capillary: 161 mg/dL — ABNORMAL HIGH (ref 70–99)

## 2020-11-19 LAB — PROTIME-INR
INR: 2.1 — ABNORMAL HIGH (ref 0.8–1.2)
Prothrombin Time: 22.7 seconds — ABNORMAL HIGH (ref 11.4–15.2)

## 2020-11-19 NOTE — Plan of Care (Signed)
  Problem: Education: Goal: Knowledge of General Education information will improve Description: Including pain rating scale, medication(s)/side effects and non-pharmacologic comfort measures Outcome: Progressing   Problem: Clinical Measurements: Goal: Respiratory complications will improve Outcome: Progressing Note: On room air   Problem: Activity: Goal: Risk for activity intolerance will decrease Outcome: Progressing Note: Up to chair with 1 assist and walker   Problem: Nutrition: Goal: Adequate nutrition will be maintained Outcome: Progressing   Problem: Coping: Goal: Level of anxiety will decrease Outcome: Progressing   Problem: Elimination: Goal: Will not experience complications related to bowel motility Outcome: Progressing Note: BM today Goal: Will not experience complications related to urinary retention Outcome: Progressing   Problem: Pain Managment: Goal: General experience of comfort will improve Outcome: Progressing Note: No complaints of pain   Problem: Safety: Goal: Ability to remain free from injury will improve Outcome: Progressing

## 2020-11-19 NOTE — Progress Notes (Signed)
Physical Therapy Treatment Patient Details Name: Dennis [name]. MRN: [id number] DOB: 09-May-1961 Today's Date: 11/19/2020    History of Present Illness Patient is a 60 y/o male with PMH significant for IDDM, HTN, HLD, CVA in 2016, lung cancer, and CAD on warfarin. Patient had mechanical fall resulting in L femur fx. Patient s/p IM nail L femur on 1/19. Covid+ on precautions as of 10/31/19; off prec as of 1/31    PT Comments    Continuing work on functional mobility and activity tolerance;  Able to focus more on therex today; Needs more reinforcement of quad setting -- noting some activiation, but would like ot see more recruitement; Dennis [name] seemed pleased with himself that he could perform 10 straight leg raises without assist   Follow Up Recommendations  SNF;Supervision/Assistance - 24 hour     Equipment Recommendations  Rolling walker with 5" wheels    Recommendations for Other Services       Precautions / Restrictions Precautions Precautions: Fall Restrictions LLE Weight Bearing: Weight bearing as tolerated    Mobility  Bed Mobility Overal bed mobility: Needs Assistance Bed Mobility: Supine to Sit     Supine to sit: Supervision;HOB elevated     General bed mobility comments: increased time and effort, used rail  Transfers Overall transfer level: Needs assistance Equipment used: Rolling walker (2 wheeled) Transfers: Sit to/from Stand Sit to Stand: Min guard         General transfer comment: cues for hand placement. minA for boost up  Ambulation/Gait Ambulation/Gait assistance: Min guard Gait Distance (Feet): 30 Feet (15+15) Assistive device: Rolling walker (2 wheeled) Gait Pattern/deviations: Step-to pattern     General Gait Details: Cues for gait sequence and to work on accepting more weight onto RLE, as well as to try to get to a step-through pattern   Stairs             Wheelchair Mobility    Modified Rankin (Stroke Patients Only)        Balance                                            Cognition Arousal/Alertness: Awake/alert Behavior During Therapy: WFL for tasks assessed/performed Overall Cognitive Status: Within Functional Limits for tasks assessed                                        Exercises General Exercises - Lower Extremity Quad Sets: AROM;Both;10 reps Short Arc QuadSinclair Ship;Left;10 reps Heel Slides: AROM;Left;10 reps Straight Leg Raises: AROM;Left;10 reps (some quad lag noted)    General Comments        Pertinent Vitals/Pain Pain Assessment: No/denies pain Faces Pain Scale: Hurts a little bit Pain Location: L LE in standing and with weight bearing Pain Descriptors / Indicators: Grimacing;Guarding Pain Intervention(s): Monitored during session    Home Living                      Prior Function            PT Goals (current goals can now be found in the care plan section) Acute Rehab PT Goals Patient Stated Goal: Agreeing to focus mreo on therex PT Goal Formulation: With patient Time For Goal Achievement: 12/03/20 Potential to Achieve Goals: Good Progress  towards PT goals: Progressing toward goals    Frequency    Min 3X/week      PT Plan Current plan remains appropriate    Co-evaluation              AM-PAC PT "6 Clicks" Mobility   Outcome Measure  Help needed turning from your back to your side while in a flat bed without using bedrails?: A Little Help needed moving from lying on your back to sitting on the side of a flat bed without using bedrails?: A Little Help needed moving to and from a bed to a chair (including a wheelchair)?: A Little Help needed standing up from a chair using your arms (e.g., wheelchair or bedside chair)?: A Little Help needed to walk in hospital room?: A Little Help needed climbing 3-5 steps with a railing? : A Lot 6 Click Score: 17    End of Session Equipment Utilized During Treatment: Gait  belt Activity Tolerance: Patient tolerated treatment well Patient left: in chair;with call bell/phone within reach;with chair alarm set Nurse Communication: Mobility status PT Visit Diagnosis: Unsteadiness on feet (R26.81);Other abnormalities of gait and mobility (R26.89);Muscle weakness (generalized) (M62.81);History of falling (Z91.81)     Time: 4128-7867 PT Time Calculation (min) (ACUTE ONLY): 29 min  Charges:  $Gait Training: 8-22 mins $Therapeutic Exercise: 8-22 mins                    Roney Marion, PT  Acute Rehabilitation Services Pager [phone] Office [place] 11/19/2020, 7:55 PM

## 2020-11-19 NOTE — Progress Notes (Signed)
ANTICOAGULATION CONSULT NOTE  Pharmacy Consult:  Coumadin Indication: CVA  No Known Allergies  Patient Measurements: Height: 6' (182.9 cm) Weight: 88.6 kg (195 lb 5.2 oz) IBW/kg (Calculated) : 77.6 Heparin Dosing Weight: 85.3 kg  Vital Signs: Temp: 98.2 F (36.8 C) (02/07 0418) Temp Source: Oral (02/07 0418) BP: 99/80 (02/07 0418) Pulse Rate: 95 (02/07 0418)  Labs: Recent Labs    11/17/20 0345 11/19/20 0457  HGB  --  9.7*  HCT  --  31.7*  PLT  --  221  LABPROT  --  22.7*  INR  --  2.1*  CREATININE 1.06  --     Estimated Creatinine Clearance: 83.4 mL/min (by C-G formula based on SCr of 1.06 mg/dL).   Assessment: 89 YOM on Coumadin PTA for hx CVA. Admitted with INR 1.9 on PTA dose of 3.75 mg daily EXCEPT for 7.5 mg on Tues (+/- Thurs per [facility] note).  Held and reversed for repair of femur fracture with ortho on 10/31/20. Coumadin resumed on 11/01/20 without a bridge.  INR therapeutic at 2.1 today. No reported bleeding. Hgb 9.7. Plt wnl.   Goal of Therapy:  INR 2-3 Monitor platelets by anticoagulation protocol: Yes   Plan:  Continue scheduled warfarin 4mg  daily  Monitor QMon INR, weekly CBC/plt Monitor for signs/symptoms of bleeding   Alanda Slim, PharmD, [facility] Clinical Pharmacist Please see AMION for all Pharmacists' Contact Phone Numbers 11/19/2020, 8:09 AM

## 2020-11-19 NOTE — Progress Notes (Signed)
PROGRESS NOTE    Dennis [name].  HWE:[id number] DOB: 02-25-1961 DOA: 10/30/2020 PCP: [name], MD   Brief Narrative: Dennis [name]. is a 60 y.o. male with a history of systolic CHF last EF 89-38%,B/OFBPZ, CAD, right MCA CVAin 06/2020,on Coumadin,small cell lung cancer with mets to brain (dx3/2021),DM type II, cocaine abuse, and remote alcohol abuse patient presented secondary to slipping and falling on ice resulting in a left distal femur fracture. He was found to be incidentally COVID-19 positive. He underwent repair and has been waiting for a SNF bed.   Assessment & Plan:   Principal Problem:   Femoral distal fracture (HCC) Active Problems:   Automatic implantable cardioverter-defibrillator in situ   Chronic systolic heart failure (HCC)   Cocaine abuse ([place])   Dysphagia   History of CVA with residual deficit   Uncontrolled type 2 diabetes mellitus with hyperglycemia, with long-term current use of insulin (HCC)   Orthostatic hypotension   Brain metastases ([place])   Fall   COVID-19 virus infection   Distal left femur fracture Secondary to fall. Orthopedic surgery consulted and patient underwent intramedullary femoral nailing on 10/31/2020. WBAT. Plan for SNF on discharge per PT/OT recommendations. -Out of bed today  COVID-19 infection Patient treated with Remdesivir x3 doses. Patient was previously on Decadron which was initially started at a higher dose and titrated down.   Acute respiratory failure with hypoxia Initial concern for possible heart failure for which he received one dose of IV lasix but possibly more related to COVID-19 infection. Hypoxia quickly resolved.  Chronic systolic heart failure S/p AICD Patient with an EF of 25-30%. Currently stable volume status. On Coumadin. Per cardiology, patient's AICD is nearing ERI; this will be addressed as an outpatient in light of patient's cancer. -Continue Coumadin  Anemia of chronic disease Acute blood loss  anemia Drop in hemoglobin secondary to surgery/fracture. Stable.  Constipation -Continue Miralax BID  Orthostatic hypotension Stable. Patient is on midodrine and salt tablets as an outpatient in light of underlying reduced EF heart failure. -Continue midodrine and salt tablet  Diabetes mellitus, type 2 -Continue Lantus 25 units BID and Novolog 2 units with meals and SSI  AKI Baseline creatinine of 0.9-1. Creatinine increased to 1.5 which improved with Lasix administration. Stable.  Dysphagia Speech therapy evaluated with evidence of mild oropharyngeal dysphagia in setting of prior stroke. On a regular consistency diet.  History of stroke Residual dysarthria/left sided weakness. On coumadin -Continue Coumadin  History of cocaine abuse Continues to use cocaine. UDS on 1/18 is positive for cocaine.  Small cell lung cancer Brain metastasis Patient follows with Dr. Mickeal Skinner and Dr. Julien Nordmann as an outpatient. He is s/p whole brain radiation and chemotherapy. Currently on dexamethasone. MRI on 2/2 significant for two new punctate foci of enhancement in right frontal lobe/right frontoparietal region which may or may not be related to artifact; recommendation for interval follow-up.   DVT prophylaxis: Coumadin Code Status:   Code Status: Full Code Family Communication: None at bedside Disposition Plan: Discharge to SNF when bed is available. Medically stable.   Consultants:   Cardiology  Neurooncology  Orthopedic surgery  Procedures:   IM NAILING  Antimicrobials:  Cefazolin    Subjective: No issues reported overnight.  Objective: Vitals:   11/18/20 1947 11/19/20 0418 11/19/20 0500 11/19/20 0819  BP: (!) 147/82 99/80  108/86  Pulse: (!) 104 95  95  Resp: 18 16  16   Temp:  98.2 F (36.8 C)  98.2 F (36.8  C)  TempSrc:  Oral  Oral  SpO2: 100% 97%  99%  Weight:   88.6 kg   Height:        Intake/Output Summary (Last 24 hours) at 11/19/2020 0920 Last data filed at  11/19/2020 0900 Gross per 24 hour  Intake 240 ml  Output 50 ml  Net 190 ml   Filed Weights   11/17/20 0500 11/18/20 0500 11/19/20 0500  Weight: 88.6 kg 88.6 kg 88.6 kg    Examination:  General exam: Appears calm and comfortable   Data Reviewed: I have personally reviewed following labs and imaging studies  CBC Lab Results  Component Value Date   WBC 11.7 (H) 11/19/2020   RBC 3.10 (L) 11/19/2020   HGB 9.7 (L) 11/19/2020   HCT 31.7 (L) 11/19/2020   MCV 102.3 (H) 11/19/2020   MCH 31.3 11/19/2020   PLT 221 11/19/2020   MCHC 30.6 11/19/2020   RDW 16.4 (H) 11/19/2020   LYMPHSABS 2.3 11/04/2020   MONOABS 1.4 (H) 11/04/2020   EOSABS 0.1 11/04/2020   BASOSABS 0.0 24/23/5361     Last metabolic panel Lab Results  Component Value Date   NA 140 11/17/2020   K 3.7 11/17/2020   CL 105 11/17/2020   CO2 26 11/17/2020   BUN 20 11/17/2020   CREATININE 1.06 11/17/2020   GLUCOSE 145 (H) 11/17/2020   GFRNONAA >60 11/17/2020   GFRAA >60 07/16/2020   CALCIUM 8.6 (L) 11/17/2020   PHOS 3.2 11/03/2020   PROT 5.2 (L) 11/04/2020   ALBUMIN 2.2 (L) 11/04/2020   BILITOT 0.7 11/04/2020   ALKPHOS 78 11/04/2020   AST 21 11/04/2020   ALT 14 11/04/2020   ANIONGAP 9 11/17/2020    CBG (last 3)  Recent Labs    11/18/20 1628 11/18/20 1955 11/19/20 0654  GLUCAP 172* 174* 150*     GFR: Estimated Creatinine Clearance: 83.4 mL/min (by C-G formula based on SCr of 1.06 mg/dL).  Coagulation Profile: Recent Labs  Lab 11/13/20 0301 11/14/20 0400 11/15/20 0434 11/19/20 0457  INR 2.5* 2.6* 2.6* 2.1*    No results found for this or any previous visit (from the past 240 hour(s)).      Radiology Studies: No results found.      Scheduled Meds: . atorvastatin  20 mg Oral Daily  . Chlorhexidine Gluconate Cloth  6 each Topical Daily  . cholecalciferol  2,000 Units Oral BID  . dexamethasone  2 mg Oral Daily  . feeding supplement (GLUCERNA SHAKE)  237 mL Oral BID BM  . insulin  aspart  0-15 Units Subcutaneous TID WC  . insulin aspart  0-5 Units Subcutaneous QHS  . insulin aspart  2 Units Subcutaneous TID WC  . insulin glargine  25 Units Subcutaneous BID  . midodrine  10 mg Oral TID WC  . multivitamin with minerals  1 tablet Oral Daily  . polyethylene glycol  17 g Oral BID  . senna-docusate  1 tablet Oral BID  . sodium chloride flush  10-40 mL Intracatheter Q12H  . sodium chloride  1 g Oral q AM  . tamsulosin  0.4 mg Oral Daily  . warfarin  4 mg Oral q1600  . Warfarin - Pharmacist Dosing Inpatient   Does not apply q1600   Continuous Infusions:    LOS: 20 days     Cordelia Poche, MD [facility] 11/19/2020, 9:20 AM  If 7PM-7AM, please contact night-coverage [email or web address]

## 2020-11-20 ENCOUNTER — Ambulatory Visit: Payer: Medicare Other | Admitting: Internal Medicine

## 2020-11-20 DIAGNOSIS — Z95810 Presence of automatic (implantable) cardiac defibrillator: Secondary | ICD-10-CM | POA: Diagnosis not present

## 2020-11-20 DIAGNOSIS — C7931 Secondary malignant neoplasm of brain: Secondary | ICD-10-CM | POA: Diagnosis not present

## 2020-11-20 DIAGNOSIS — S72402A Unspecified fracture of lower end of left femur, initial encounter for closed fracture: Secondary | ICD-10-CM | POA: Diagnosis not present

## 2020-11-20 DIAGNOSIS — F1410 Cocaine abuse, uncomplicated: Secondary | ICD-10-CM | POA: Diagnosis not present

## 2020-11-20 LAB — GLUCOSE, CAPILLARY
Glucose-Capillary: 97 mg/dL (ref 70–99)
Glucose-Capillary: 122 mg/dL — ABNORMAL HIGH (ref 70–99)
Glucose-Capillary: 168 mg/dL — ABNORMAL HIGH (ref 70–99)
Glucose-Capillary: 209 mg/dL — ABNORMAL HIGH (ref 70–99)

## 2020-11-20 NOTE — Progress Notes (Signed)
PROGRESS NOTE    Dennis [name].  GYI:[id number] DOB: Oct 26, 1960 DOA: 10/30/2020 PCP: [name], MD   Brief Narrative: Dennis [name]. is a 60 y.o. male with a history of systolic CHF last EF 35-00%,X/FGHWE, CAD, right MCA CVAin 06/2020,on Coumadin,small cell lung cancer with mets to brain (dx3/2021),DM type II, cocaine abuse, and remote alcohol abuse patient presented secondary to slipping and falling on ice resulting in a left distal femur fracture. He was found to be incidentally COVID-19 positive. He underwent repair and has been waiting for a SNF bed.   Assessment & Plan:   Principal Problem:   Femoral distal fracture (HCC) Active Problems:   Automatic implantable cardioverter-defibrillator in situ   Chronic systolic heart failure (HCC)   Cocaine abuse ([place])   Dysphagia   History of CVA with residual deficit   Uncontrolled type 2 diabetes mellitus with hyperglycemia, with long-term current use of insulin (HCC)   Orthostatic hypotension   Brain metastases ([place])   Fall   COVID-19 virus infection   Distal left femur fracture Secondary to fall. Orthopedic surgery consulted and patient underwent intramedullary femoral nailing on 10/31/2020. WBAT. Plan for SNF on discharge per PT/OT recommendations. -Out of bed  COVID-19 infection Patient treated with Remdesivir x3 doses. Patient was previously on Decadron which was initially started at a higher dose and titrated down.   Acute respiratory failure with hypoxia Initial concern for possible heart failure for which he received one dose of IV lasix but possibly more related to COVID-19 infection. Hypoxia quickly resolved.  Chronic systolic heart failure S/p AICD Patient with an EF of 25-30%. Currently stable volume status. On Coumadin. Per cardiology, patient's AICD is nearing ERI; this will be addressed as an outpatient in light of patient's cancer. -Continue Coumadin  Anemia of chronic disease Acute blood loss  anemia Drop in hemoglobin secondary to surgery/fracture. Stable.  Constipation -Continue Miralax BID  Orthostatic hypotension Stable. Patient is on midodrine and salt tablets as an outpatient in light of underlying reduced EF heart failure. -Continue midodrine and salt tablet  Diabetes mellitus, type 2 -Continue Lantus 25 units BID and Novolog 2 units with meals and SSI  AKI Baseline creatinine of 0.9-1. Creatinine increased to 1.5 which improved with Lasix administration. Stable.  Dysphagia Speech therapy evaluated with evidence of mild oropharyngeal dysphagia in setting of prior stroke. On a regular consistency diet.  History of stroke Residual dysarthria/left sided weakness. On coumadin -Continue Coumadin  History of cocaine abuse Continues to use cocaine. UDS on 1/18 is positive for cocaine.  Small cell lung cancer Brain metastasis Patient follows with Dr. Mickeal Skinner and Dr. Julien Nordmann as an outpatient. He is s/p whole brain radiation and chemotherapy. Currently on dexamethasone. MRI on 2/2 significant for two new punctate foci of enhancement in right frontal lobe/right frontoparietal region which may or may not be related to artifact; recommendation for interval follow-up.   DVT prophylaxis: Coumadin Code Status:   Code Status: Full Code Family Communication: None at bedside Disposition Plan: Discharge to SNF when bed is available. Medically stable.   Consultants:   Cardiology  Neurooncology  Orthopedic surgery  Procedures:   IM NAILING  Antimicrobials:  Cefazolin    Subjective: No concerns today.  Objective: Vitals:   11/19/20 2130 11/20/20 0417 11/20/20 0500 11/20/20 0734  BP: 101/78 100/80  104/79  Pulse: (!) 102 94  96  Resp: 20 20  18   Temp: 98.3 F (36.8 C) (!) 97.4 F (36.3 C)  (!) 97.5  F (36.4 C)  TempSrc:  Oral    SpO2: 93% 91%  100%  Weight:   88.6 kg   Height:        Intake/Output Summary (Last 24 hours) at 11/20/2020 1212 Last data  filed at 11/19/2020 1737 Gross per 24 hour  Intake 477 ml  Output 300 ml  Net 177 ml   Filed Weights   11/18/20 0500 11/19/20 0500 11/20/20 0500  Weight: 88.6 kg 88.6 kg 88.6 kg    Examination:  General exam: Appears calm and comfortable Respiratory system: Clear to auscultation. Respiratory effort normal. Cardiovascular system: S1 & S2 heard, RRR. No murmurs, rubs, gallops or clicks. Gastrointestinal system: Abdomen is nondistended, soft and nontender. No organomegaly or masses felt. Normal bowel sounds heard. Central nervous system: Alert and oriented. No focal neurological deficits. Musculoskeletal: No edema. No calf tenderness Skin: No cyanosis. No rashes Psychiatry: Judgement and insight appear normal. Mood & affect appropriate.    Data Reviewed: I have personally reviewed following labs and imaging studies  CBC Lab Results  Component Value Date   WBC 11.7 (H) 11/19/2020   RBC 3.10 (L) 11/19/2020   HGB 9.7 (L) 11/19/2020   HCT 31.7 (L) 11/19/2020   MCV 102.3 (H) 11/19/2020   MCH 31.3 11/19/2020   PLT 221 11/19/2020   MCHC 30.6 11/19/2020   RDW 16.4 (H) 11/19/2020   LYMPHSABS 2.3 11/04/2020   MONOABS 1.4 (H) 11/04/2020   EOSABS 0.1 11/04/2020   BASOSABS 0.0 40/98/1191     Last metabolic panel Lab Results  Component Value Date   NA 140 11/17/2020   K 3.7 11/17/2020   CL 105 11/17/2020   CO2 26 11/17/2020   BUN 20 11/17/2020   CREATININE 1.06 11/17/2020   GLUCOSE 145 (H) 11/17/2020   GFRNONAA >60 11/17/2020   GFRAA >60 07/16/2020   CALCIUM 8.6 (L) 11/17/2020   PHOS 3.2 11/03/2020   PROT 5.2 (L) 11/04/2020   ALBUMIN 2.2 (L) 11/04/2020   BILITOT 0.7 11/04/2020   ALKPHOS 78 11/04/2020   AST 21 11/04/2020   ALT 14 11/04/2020   ANIONGAP 9 11/17/2020    CBG (last 3)  Recent Labs    11/19/20 2134 11/20/20 0815 11/20/20 1116  GLUCAP 161* 97 122*     GFR: Estimated Creatinine Clearance: 83.4 mL/min (by C-G formula based on SCr of 1.06  mg/dL).  Coagulation Profile: Recent Labs  Lab 11/14/20 0400 11/15/20 0434 11/19/20 0457  INR 2.6* 2.6* 2.1*    No results found for this or any previous visit (from the past 240 hour(s)).      Radiology Studies: No results found.      Scheduled Meds: . atorvastatin  20 mg Oral Daily  . Chlorhexidine Gluconate Cloth  6 each Topical Daily  . cholecalciferol  2,000 Units Oral BID  . dexamethasone  2 mg Oral Daily  . feeding supplement (GLUCERNA SHAKE)  237 mL Oral BID BM  . insulin aspart  0-15 Units Subcutaneous TID WC  . insulin aspart  0-5 Units Subcutaneous QHS  . insulin aspart  2 Units Subcutaneous TID WC  . insulin glargine  25 Units Subcutaneous BID  . midodrine  10 mg Oral TID WC  . multivitamin with minerals  1 tablet Oral Daily  . polyethylene glycol  17 g Oral BID  . senna-docusate  1 tablet Oral BID  . sodium chloride flush  10-40 mL Intracatheter Q12H  . sodium chloride  1 g Oral q AM  . tamsulosin  0.4 mg Oral Daily  . warfarin  4 mg Oral q1600  . Warfarin - Pharmacist Dosing Inpatient   Does not apply q1600   Continuous Infusions:    LOS: 21 days     Cordelia Poche, MD [facility] 11/20/2020, 12:12 PM  If 7PM-7AM, please contact night-coverage [email or web address]

## 2020-11-20 NOTE — Progress Notes (Signed)
Occupational Therapy Treatment Patient Details Name: Dennis [name]. MRN: [id number] DOB: 30-Jul-1961 Today's Date: 11/20/2020    History of present illness Patient is a 60 y/o male with PMH significant for IDDM, HTN, HLD, CVA in 2016, lung cancer, and CAD on warfarin. Patient had mechanical fall resulting in L femur fx. Patient s/p IM nail L femur on 1/19. Covid+ on precautions as of 10/31/19; off prec as of 1/31   OT comments  Pt making good progress with functional goals. OT will continue to follow acutely to maximize level of function and safety  Follow Up Recommendations       Equipment Recommendations  Other (comment);3 in 1 bedside commode (RW, TBD at next level of care)    Recommendations for Other Services      Precautions / Restrictions Precautions Precautions: Fall Restrictions Weight Bearing Restrictions: Yes LLE Weight Bearing: Weight bearing as tolerated       Mobility Bed Mobility Overal bed mobility: Needs Assistance Bed Mobility: Supine to Sit     Supine to sit: Supervision;HOB elevated     General bed mobility comments: increased time and effort, used rail  Transfers Overall transfer level: Needs assistance Equipment used: Rolling walker (2 wheeled) Transfers: Sit to/from Stand Sit to Stand: Min guard              Balance Overall balance assessment: Needs assistance Sitting-balance support: Feet supported;No upper extremity supported Sitting balance-Leahy Scale: Fair     Standing balance support: Bilateral upper extremity supported;During functional activity Standing balance-Leahy Scale: Poor Standing balance comment: stood at sink for bathing and to don clean gown                           ADL either performed or assessed with clinical judgement   ADL Overall ADL's : Needs assistance/impaired     Grooming: Wash/dry hands;Wash/dry face;Oral care;Min guard;Standing   Upper Body Bathing: Set up;Sitting   Lower Body Bathing:  Minimal assistance;Sitting/lateral leans;Sit to/from stand   Upper Body Dressing : Min guard;Standing   Lower Body Dressing: Moderate assistance;Sitting/lateral leans;Sit to/from stand   Toilet Transfer: RW;Stand-pivot;Cueing for safety;Cueing for sequencing;Comfort height toilet;Grab bars;Ambulation;Min guard   Toileting- Clothing Manipulation and Hygiene: Minimal assistance;Sit to/from stand       Functional mobility during ADLs: Minimal assistance;Rolling walker;Cueing for sequencing;Cueing for safety       Vision Patient Visual Report: No change from baseline     Perception     Praxis      Cognition Arousal/Alertness: Awake/alert Behavior During Therapy: WFL for tasks assessed/performed Overall Cognitive Status: Within Functional Limits for tasks assessed                                 General Comments: pt linens and bottom of gown soaked urine with dirty gown upon arrival and pt says that he didn't call for any assist to clean up        Exercises     Shoulder Instructions       General Comments      Pertinent Vitals/ Pain       Pain Assessment: Faces Faces Pain Scale: Hurts a little bit Pain Location: L LE in standing and with weight bearing Pain Descriptors / Indicators: Grimacing;Guarding Pain Intervention(s): Monitored during session;Repositioned  Home Living  Prior Functioning/Environment              Frequency  Min 2X/week        Progress Toward Goals  OT Goals(current goals can now be found in the care plan section)  Progress towards OT goals: Progressing toward goals     Plan Discharge plan remains appropriate    Co-evaluation                 AM-PAC OT "6 Clicks" Daily Activity     Outcome Measure   Help from another person eating meals?: None Help from another person taking care of personal grooming?: A Little Help from another person toileting,  which includes using toliet, bedpan, or urinal?: A Little Help from another person bathing (including washing, rinsing, drying)?: A Lot Help from another person to put on and taking off regular upper body clothing?: A Little Help from another person to put on and taking off regular lower body clothing?: A Lot 6 Click Score: 17    End of Session Equipment Utilized During Treatment: Gait belt;Rolling walker;Other (comment) (3 in 1)  OT Visit Diagnosis: Unsteadiness on feet (R26.81);Other abnormalities of gait and mobility (R26.89);History of falling (Z91.81);Pain Pain - Right/Left: Left Pain - part of body: Leg   Activity Tolerance Patient tolerated treatment well   Patient Left in chair;with call bell/phone within reach;with chair alarm set   Nurse Communication          Time: 3817-7116 OT Time Calculation (min): 25 min  Charges: OT General Charges $OT Visit: 1 Visit OT Treatments $Self Care/Home Management : 8-22 mins $Therapeutic Activity: 8-22 mins     Britt Bottom 11/20/2020, 3:37 PM

## 2020-11-20 NOTE — Progress Notes (Signed)
Nutrition Follow-up  DOCUMENTATION CODES:   Not applicable  INTERVENTION:   -Continue MVI with minerals daily -ContinueGlucerna Shake poBID, each supplement provides 220 kcal and 10 grams of protein -RD to sign off due to medical stability. If further nutritional needs arise, please re-consult RD.  NUTRITION DIAGNOSIS:   Increased nutrient needs related to post-op healing as evidenced by estimated needs.  Ongoing  GOAL:   Patient will meet greater than or equal to 90% of their needs  Progressing   MONITOR:   Supplement acceptance,PO intake,I & O's,Weight trends,Labs,Skin  REASON FOR ASSESSMENT:   Consult Hip fracture protocol  ASSESSMENT:   24 YOM with PMH of CAD, CHF, ischemic cardiomyopathy, AICD, DM2, stroke, lung cancer mets to brain. Pt admitted 10/30/20 for fall, x-ray indicated left femur fx. Pt underwent surgery for fx. Pt positive for COVID-19.  1/19- s/pRetrograde intramedullary nailing of left femur fracture  Reviewed I/O's: +427 ml x 24 hours and -4.4 L since 11/06/20  UOP: 300 ml x 24 hours  Pt unavailable at time of visit.   Pt remains with good appetite. Noted meal completions 75-100%. Pt fairly consistent about consuming Glucerna shakes.   Reviewed wt hx; wt has been stable since admission.  Medications reviewed and include vitamin D3, miralax, and senokot.   Per TOC notes, pt remains medically stable for discharge and is awaiting SNF placement.   Due to medical stability, RD will sign off. If further nutritional needs arise, please re-consult RD.   Labs reviewed: CBGS: 83-161 (inpatient orders for glycemic control are 0-15 units insulin aspart TID with meals, 0-5 units insulin aspart daily at bedtime, 2 units insulin aspart TID with meals, and 25 units insulin glargine BID).   Diet Order:   Diet Order            Diet Carb Modified Fluid consistency: Thin; Room service appropriate? Yes  Diet effective now           Diet - low sodium  heart healthy                 EDUCATION NEEDS:   No education needs have been identified at this time  Skin:  Skin Assessment: Skin Integrity Issues: Skin Integrity Issues:: Incisions Incisions: Closed incision on left leg (10/31/20)  Last BM:  11/14/20  Height:   Ht Readings from Last 1 Encounters:  10/31/20 6' (1.829 m)    Weight:   Wt Readings from Last 1 Encounters:  11/20/20 88.6 kg    Ideal Body Weight:  81 kg  BMI:  Body mass index is 26.49 kg/m.  Estimated Nutritional Needs:   Kcal:  2000-2400  Protein:  95-110  Fluid:  >2 L    Loistine Chance, RD, LDN, [place] Registered Dietitian II Certified Diabetes Care and Education Specialist Please refer to [facility] for RD and/or RD on-call/weekend/after hours pager

## 2020-11-21 DIAGNOSIS — S72402A Unspecified fracture of lower end of left femur, initial encounter for closed fracture: Secondary | ICD-10-CM | POA: Diagnosis not present

## 2020-11-21 DIAGNOSIS — S72402B Unspecified fracture of lower end of left femur, initial encounter for open fracture type I or II: Secondary | ICD-10-CM

## 2020-11-21 DIAGNOSIS — C7931 Secondary malignant neoplasm of brain: Secondary | ICD-10-CM | POA: Diagnosis not present

## 2020-11-21 DIAGNOSIS — I5022 Chronic systolic (congestive) heart failure: Secondary | ICD-10-CM | POA: Diagnosis not present

## 2020-11-21 LAB — GLUCOSE, CAPILLARY
Glucose-Capillary: 233 mg/dL — ABNORMAL HIGH (ref 70–99)
Glucose-Capillary: 111 mg/dL — ABNORMAL HIGH (ref 70–99)
Glucose-Capillary: 290 mg/dL — ABNORMAL HIGH (ref 70–99)
Glucose-Capillary: 275 mg/dL — ABNORMAL HIGH (ref 70–99)

## 2020-11-21 NOTE — Progress Notes (Signed)
[facility] PROGRESS NOTE    Progress Note  Dennis [name].  TIW:[id number] DOB: November 28, 1960 DOA: 10/30/2020 PCP: [name], MD     Brief Narrative:   Dennis [name]. is an 60 y.o. 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 diagnosed on 12/31/2019, diabetes mellitus type 2 remote history of alcohol abuse presents with a fall that led to a left distal femur fracture, was found to be incidentally Covid positive on the right surgical repair  Awaiting skilled nursing facility placement  Significant Events: 10/31/2020 intramedullary femoral nailing  Significant studies: 10/30/2020 displaced and angulated spiral fracture of the distal femur 11/13/2018 post intramedullary rod fixation of the distal left femur fracture Antibiotics: None  Microbiology data: Blood culture:  Procedures: None  Assessment/Plan:   Distal  Femoral distal fracture [facility]): Awaiting skilled nursing facility placement.  Incidental COVID-19 infection: He received 3 days of IV remdesivir. Now back on his home dose of Decadron.  Acute respiratory failure with hypoxia: Initially was there was concern of heart failure he was given an IV Lasix and now resolved.  Chronic systolic heart failure: With an EF of 25 to 30%, continue current medication.  Normocytic anemia/anemia of chronic disease: Hemoglobin is stable continue iron as an outpatient.  Constipation: Continue MiraLAX twice daily.  Hypotension: Continue midodrine.  Diabetes mellitus type 2: Continue long-acting insulin plus sliding scale.  Acute kidney injury With a baseline creatinine of 0.9, on admission 1.5 improved with IV Lasix.  History of stroke: Continue Coumadin.  Small cell metastatic brain cancer: Continue whole brain radiation and chemotherapy as an outpatient continue dexamethasone.  DVT  prophylaxis: lovenxo Family Communication:none Status is: Inpatient  Remains inpatient appropriate because:Hemodynamically unstable   Dispo:  Patient From: Home  Planned Disposition: [place]  Expected discharge date: 11/26/2020  Medically stable for discharge: Yes  Difficult to place patient: Yes    Code Status:     Code Status Orders  (From admission, onward)         Start     Ordered   10/30/20 1530  Full code  Continuous        10/30/20 1538        Code Status History    Date Active Date Inactive Code Status Order ID Comments User Context   07/08/2020 0127 07/25/2020 2346 Full Code [id number]  Vernelle Emerald, MD ED   12/23/2019 1438 12/28/2019 2357 Full Code [id number]  Richarda Osmond, DO Inpatient   09/04/2018 2356 09/05/2018 1650 Full Code [id number]  Phillips Grout, MD ED   08/17/2015 2244 08/23/2015 2157 Full Code [id number]  Harriet Butte, NP Inpatient   08/17/2015 0032 08/17/2015 2244 Full Code [id number]  Dalia Heading, PA-C ED   07/16/2015 1846 07/17/2015 1836 Full Code [id number]  Davonna Belling, MD ED   02/07/2015 2004 02/14/2015 1629 Full Code [id number]  Bary Leriche, PA-C Inpatient   02/07/2015 1030 02/07/2015 2004 Full Code [id number]  Jolaine Artist, MD Inpatient   02/01/2015 1353 02/07/2015 1030 Full Code [id number]  Hosie Poisson, MD Inpatient   01/26/2015 1821 02/01/2015 1353 Full Code [id number]  Flora Lipps Inpatient   01/24/2015 1914 01/26/2015 1821 Full Code [id number]  Annita Brod, MD Inpatient   09/21/2014 1634 09/25/2014 1640 Full Code [id number]  Conrad Aberdeen, NP Inpatient   05/30/2014 1445  06/01/2014 1725 Full Code [id number]  Jolaine Artist, MD Inpatient   05/26/2014 1901 05/30/2014 1445 Full Code [id number]  Blain Pais, MD Inpatient   08/09/2013 1426 08/10/2013 1705 Full Code [id number]  Ivor Costa, MD Inpatient   Advance Care Planning Activity        IV Access:    Peripheral  IV   Procedures and diagnostic studies:   No results found.   Medical Consultants:    None.   Subjective:    [name]. no new complaints were to be discharged.  Objective:    Vitals:   11/20/20 1521 11/20/20 2113 11/21/20 0508 11/21/20 0813  BP: 107/76 106/84 113/89 111/87  Pulse: (!) 102 97 97 (!) 101  Resp: 19 18 18    Temp: 97.7 F (36.5 C) 97.7 F (36.5 C) 98.4 F (36.9 C) (!) 97.5 F (36.4 C)  TempSrc:  Oral Oral Oral  SpO2: 96% 95% 100% 98%  Weight:      Height:       SpO2: 98 % O2 Flow Rate (L/min): 3 L/min   Intake/Output Summary (Last 24 hours) at 11/21/2020 1150 Last data filed at 11/21/2020 0510 Gross per 24 hour  Intake 480 ml  Output 650 ml  Net -170 ml   Filed Weights   11/18/20 0500 11/19/20 0500 11/20/20 0500  Weight: 88.6 kg 88.6 kg 88.6 kg    Exam: General exam: In no acute distress. Respiratory system: Good air movement and clear to auscultation. Cardiovascular system: S1 & S2 heard, RRR. No JVD. Gastrointestinal system: Abdomen is nondistended, soft and nontender.  Extremities: No pedal edema. Skin: No rashes, lesions or ulcers Psychiatry: Judgement and insight appear normal. Mood & affect appropriate.    Data Reviewed:    Labs: Basic Metabolic Panel: Recent Labs  Lab 11/15/20 0434 11/16/20 0501 11/17/20 0345  NA 136 138 140  K 4.0 3.8 3.7  CL 103 104 105  CO2 26 25 26   GLUCOSE 288* 157* 145*  BUN 20 19 20   CREATININE 1.06 1.04 1.06  CALCIUM 8.6* 9.1 8.6*   GFR Estimated Creatinine Clearance: 83.4 mL/min (by C-G formula based on SCr of 1.06 mg/dL). Liver Function Tests: No results for input(s): AST, ALT, ALKPHOS, BILITOT, PROT, ALBUMIN in the last 168 hours. No results for input(s): LIPASE, AMYLASE in the last 168 hours. No results for input(s): AMMONIA in the last 168 hours. Coagulation profile Recent Labs  Lab 11/15/20 0434 11/19/20 0457  INR 2.6* 2.1*   COVID-19 Labs  No results for input(s):  DDIMER, FERRITIN, LDH, CRP in the last 72 hours.  Lab Results  Component Value Date   SARSCOV2NAA POSITIVE (A) 10/30/2020   SARSCOV2NAA NEGATIVE 07/07/2020   [place] NEGATIVE 12/23/2019    CBC: Recent Labs  Lab 11/15/20 0434 11/19/20 0457  WBC 11.4* 11.7*  HGB 9.1* 9.7*  HCT 29.2* 31.7*  MCV 100.7* 102.3*  PLT 213 221   Cardiac Enzymes: No results for input(s): CKTOTAL, CKMB, CKMBINDEX, TROPONINI in the last 168 hours. BNP (last 3 results) No results for input(s): PROBNP in the last 8760 hours. CBG: Recent Labs  Lab 11/20/20 0815 11/20/20 1116 11/20/20 1603 11/20/20 2112 11/21/20 0659  GLUCAP 97 122* 168* 209* 233*   D-Dimer: No results for input(s): DDIMER in the last 72 hours. Hgb A1c: No results for input(s): HGBA1C in the last 72 hours. Lipid Profile: No results for input(s): CHOL, HDL, LDLCALC, TRIG, CHOLHDL, LDLDIRECT in the last 72 hours. Thyroid function studies: No  results for input(s): TSH, T4TOTAL, T3FREE, THYROIDAB in the last 72 hours.  Invalid input(s): FREET3 Anemia work up: No results for input(s): VITAMINB12, FOLATE, FERRITIN, TIBC, IRON, RETICCTPCT in the last 72 hours. Sepsis Labs: Recent Labs  Lab 11/15/20 0434 11/19/20 0457  WBC 11.4* 11.7*   Microbiology No results found for this or any previous visit (from the past 240 hour(s)).   Medications:   . atorvastatin  20 mg Oral Daily  . Chlorhexidine Gluconate Cloth  6 each Topical Daily  . cholecalciferol  2,000 Units Oral BID  . dexamethasone  2 mg Oral Daily  . feeding supplement (GLUCERNA SHAKE)  237 mL Oral BID BM  . insulin aspart  0-15 Units Subcutaneous TID WC  . insulin aspart  0-5 Units Subcutaneous QHS  . insulin aspart  2 Units Subcutaneous TID WC  . insulin glargine  25 Units Subcutaneous BID  . midodrine  10 mg Oral TID WC  . multivitamin with minerals  1 tablet Oral Daily  . polyethylene glycol  17 g Oral BID  . senna-docusate  1 tablet Oral BID  . sodium  chloride flush  10-40 mL Intracatheter Q12H  . sodium chloride  1 g Oral q AM  . tamsulosin  0.4 mg Oral Daily  . warfarin  4 mg Oral q1600  . Warfarin - Pharmacist Dosing Inpatient   Does not apply q1600   Continuous Infusions:    LOS: 22 days   Charlynne Cousins  [facility]  11/21/2020, 11:50 AM

## 2020-11-21 NOTE — Progress Notes (Signed)
Physical Therapy Treatment Patient Details Name: Dennis [name]. MRN: [id number] DOB: June 05, 1961 Today's Date: 11/21/2020    History of Present Illness Patient is a 60 y/o male with PMH significant for IDDM, HTN, HLD, CVA in 2016, lung cancer, and CAD on warfarin. Patient had mechanical fall resulting in L femur fx. Patient s/p IM nail L femur on 1/19. Covid+ on precautions as of 10/31/19; off prec as of 1/31    PT Comments    Continuing work on functional mobility and activity tolerance;  Session focused on progressive amb, with notable progress -- he was able to increase amb distance today, and showing better weight acceptance and gait pattern with near constant cues; did not have time for focused therex today -- hope to focus on therex next session   Follow Up Recommendations  SNF;Supervision/Assistance - 24 hour     Equipment Recommendations  Rolling walker with 5" wheels;3in1 (PT)    Recommendations for Other Services       Precautions / Restrictions Precautions Precautions: Fall Restrictions LLE Weight Bearing: Weight bearing as tolerated    Mobility  Bed Mobility Overal bed mobility: Needs Assistance Bed Mobility: Supine to Sit     Supine to sit: Supervision;HOB elevated     General bed mobility comments: increased time and effort, used rail  Transfers Overall transfer level: Needs assistance Equipment used: Rolling walker (2 wheeled) Transfers: Sit to/from Stand Sit to Stand: Min assist;Min guard         General transfer comment: very slight min assist to stand from bed; minguard to stand from 3in1, using armrests  Ambulation/Gait Ambulation/Gait assistance: Min guard;Min assist Gait Distance (Feet): 60 Feet Assistive device: Rolling walker (2 wheeled) Gait Pattern/deviations: Decreased stance time - left     General Gait Details: Cues for gait sequence and to work on accepting more weight onto RLE, as well as to try to get to a step-through  pattern   Stairs             Wheelchair Mobility    Modified Rankin (Stroke Patients Only)       Balance     Sitting balance-Leahy Scale: Fair       Standing balance-Leahy Scale: Poor                              Cognition Arousal/Alertness: Awake/alert Behavior During Therapy: WFL for tasks assessed/performed Overall Cognitive Status: Within Functional Limits for tasks assessed                                        Exercises      General Comments        Pertinent Vitals/Pain Pain Assessment: No/denies pain Faces Pain Scale: Hurts a little bit Pain Location: L LE in standing and with weight bearing Pain Descriptors / Indicators: Grimacing;Guarding    Home Living                      Prior Function            PT Goals (current goals can now be found in the care plan section) Acute Rehab PT Goals Patient Stated Goal: to get to bathroom PT Goal Formulation: With patient Time For Goal Achievement: 12/03/20 Potential to Achieve Goals: Good Progress towards PT goals: Progressing toward goals    Frequency  Min 3X/week      PT Plan Current plan remains appropriate;Other (comment) (Noting it is difficult to find SNF taht will accept him for rehab; I wonder if there are any other options)    Co-evaluation              AM-PAC PT "6 Clicks" Mobility   Outcome Measure  Help needed turning from your back to your side while in a flat bed without using bedrails?: None Help needed moving from lying on your back to sitting on the side of a flat bed without using bedrails?: A Little Help needed moving to and from a bed to a chair (including a wheelchair)?: A Little Help needed standing up from a chair using your arms (e.g., wheelchair or bedside chair)?: A Little Help needed to walk in hospital room?: A Little Help needed climbing 3-5 steps with a railing? : A Lot 6 Click Score: 18    End of Session  Equipment Utilized During Treatment: Gait belt Activity Tolerance: Patient tolerated treatment well Patient left: in chair;with call bell/phone within reach;with chair alarm set Nurse Communication: Mobility status PT Visit Diagnosis: Unsteadiness on feet (R26.81);Other abnormalities of gait and mobility (R26.89);Muscle weakness (generalized) (M62.81);History of falling (Z91.81)     Time: 1120-1140 PT Time Calculation (min) (ACUTE ONLY): 20 min  Charges:  $Gait Training: 8-22 mins                     Roney Marion, [place]  Acute Rehabilitation Services Pager [phone] Office [phone]    Colletta [place] 11/21/2020, 3:48 PM

## 2020-11-21 NOTE — Plan of Care (Signed)

## 2020-11-22 DIAGNOSIS — S72402B Unspecified fracture of lower end of left femur, initial encounter for open fracture type I or II: Secondary | ICD-10-CM | POA: Diagnosis not present

## 2020-11-22 DIAGNOSIS — I5022 Chronic systolic (congestive) heart failure: Secondary | ICD-10-CM | POA: Diagnosis not present

## 2020-11-22 DIAGNOSIS — S72402A Unspecified fracture of lower end of left femur, initial encounter for closed fracture: Secondary | ICD-10-CM | POA: Diagnosis not present

## 2020-11-22 DIAGNOSIS — C7931 Secondary malignant neoplasm of brain: Secondary | ICD-10-CM | POA: Diagnosis not present

## 2020-11-22 LAB — GLUCOSE, CAPILLARY
Glucose-Capillary: 109 mg/dL — ABNORMAL HIGH (ref 70–99)
Glucose-Capillary: 137 mg/dL — ABNORMAL HIGH (ref 70–99)
Glucose-Capillary: 139 mg/dL — ABNORMAL HIGH (ref 70–99)
Glucose-Capillary: 150 mg/dL — ABNORMAL HIGH (ref 70–99)
Glucose-Capillary: 184 mg/dL — ABNORMAL HIGH (ref 70–99)
Glucose-Capillary: 210 mg/dL — ABNORMAL HIGH (ref 70–99)

## 2020-11-22 MED ORDER — POTASSIUM CHLORIDE CRYS ER 20 MEQ PO TBCR: 20.0000 meq | EXTENDED_RELEASE_TABLET | Freq: Every day | ORAL | Status: DC

## 2020-11-22 MED ORDER — FUROSEMIDE 20 MG PO TABS
20.0000 mg | ORAL_TABLET | Freq: Every day | ORAL | Status: DC
  Administered 2020-11-22 – 2020-11-28 (×7): 20 mg via ORAL
  Filled 2020-11-22 (×7): qty 1

## 2020-11-22 MED ORDER — FUROSEMIDE 20 MG PO TABS: 20.0000 mg | ORAL_TABLET | Freq: Two times a day (BID) | ORAL | Status: DC

## 2020-11-22 MED ORDER — POTASSIUM CHLORIDE CRYS ER 10 MEQ PO TBCR
10.0000 meq | EXTENDED_RELEASE_TABLET | Freq: Every day | ORAL | Status: DC
  Administered 2020-11-22 – 2020-11-28 (×7): 10 meq via ORAL
  Filled 2020-11-22 (×7): qty 1

## 2020-11-22 NOTE — Progress Notes (Signed)
[facility] PROGRESS NOTE    Progress Note  Dennis [name].  JAS:[id number] DOB: Dec 23, 1960 DOA: 10/30/2020 PCP: [name], MD     Brief Narrative:   Dennis Elsen. is an 60 y.o. 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 diagnosed on 12/31/2019, diabetes mellitus type 2 remote history of alcohol abuse presents with a fall that led to a left distal femur fracture, was found to be incidentally Covid positive on the right surgical repair  Awaiting skilled nursing facility placement  Significant Events: 10/31/2020 intramedullary femoral nailing  Significant studies: 10/30/2020 displaced and angulated spiral fracture of the distal femur  Antibiotics: None  Microbiology data: Blood culture:  Procedures: 11/13/2018 post intramedullary rod fixation of the distal left femur fracture  Assessment/Plan:    Femoral distal fracture [facility]): Awaiting skilled nursing facility placement.  Incidental COVID-19 infection: He received 3 days of IV remdesivir. Now back on his home dose of Decadron.  Acute respiratory failure with hypoxia: Initially was there was concern of heart failure he was given an IV Lasix and now resolved.  Chronic systolic heart failure: With an EF of 25 to 30%, continue current medication. Start on low-dose Lasix, check a basic metabolic panel in the morning continue potassium supplementation along with Lasix.  Normocytic anemia/anemia of chronic disease: Hemoglobin is stable continue iron as an outpatient.  Constipation: Continue MiraLAX twice daily.  Hypotension: Continue midodrine.  Diabetes mellitus type 2: Continue long-acting insulin plus sliding scale.  Acute kidney injury With a baseline creatinine of 0.9, on admission 1.5 improved with IV Lasix.  History of stroke: Continue Coumadin.  Small cell metastatic brain  cancer: Continue whole brain radiation and chemotherapy as an outpatient continue dexamethasone.  DVT prophylaxis: lovenxo Family Communication:none Status is: Inpatient  Remains inpatient appropriate because:Hemodynamically unstable   Dispo:  Patient From: Home  Planned Disposition: [place]  Expected discharge date: 11/26/2020  Medically stable for discharge: Yes  Difficult to place patient: Yes    Code Status:     Code Status Orders  (From admission, onward)         Start     Ordered   10/30/20 1530  Full code  Continuous        10/30/20 1538        Code Status History    Date Active Date Inactive Code Status Order ID Comments User Context   07/08/2020 0127 07/25/2020 2346 Full Code [id number]  Vernelle Emerald, MD ED   12/23/2019 1438 12/28/2019 2357 Full Code [id number]  Richarda Osmond, DO Inpatient   09/04/2018 2356 09/05/2018 1650 Full Code [id number]  Phillips Grout, MD ED   08/17/2015 2244 08/23/2015 2157 Full Code [id number]  Harriet Butte, NP Inpatient   08/17/2015 0032 08/17/2015 2244 Full Code [id number]  Dalia Heading, PA-C ED   07/16/2015 1846 07/17/2015 1836 Full Code [id number]  Davonna Belling, MD ED   02/07/2015 2004 02/14/2015 1629 Full Code [id number]  Bary Leriche, PA-C Inpatient   02/07/2015 1030 02/07/2015 2004 Full Code [id number]  Jolaine Artist, MD Inpatient   02/01/2015 1353 02/07/2015 1030 Full Code [id number]  Hosie Poisson, MD Inpatient   01/26/2015 1821 02/01/2015 1353 Full Code [id number]  Flora Lipps Inpatient   01/24/2015 1914 01/26/2015 1821 Full Code [id number]  Annita Brod, MD Inpatient  09/21/2014 1634 09/25/2014 1640 Full Code [id number]  Conrad [place], NP Inpatient   05/30/2014 1445 06/01/2014 1725 Full Code [id number]  Jolaine Artist, MD Inpatient   05/26/2014 1901 05/30/2014 1445 Full Code [id number]  Blain Pais, MD Inpatient   08/09/2013 1426 08/10/2013 1705 Full Code [id number]  Ivor Costa,  MD Inpatient   Advance Care Planning Activity        IV Access:    Peripheral IV   Procedures and diagnostic studies:   No results found.   Medical Consultants:    None.   Subjective:    Dennis [name]. related some cough but it did not went away.  Objective:    Vitals:   11/21/20 1500 11/21/20 2027 11/22/20 0412 11/22/20 0806  BP: 113/85 116/86 113/82 106/80  Pulse: 98 88 97 94  Resp:  17 18 17   Temp: 98.1 F (36.7 C) 98.7 F (37.1 C) 98.2 F (36.8 C) 97.6 F (36.4 C)  TempSrc: Oral Oral  Oral  SpO2: 99% 98% 98% 97%  Weight:      Height:       SpO2: 97 % O2 Flow Rate (L/min): 3 L/min   Intake/Output Summary (Last 24 hours) at 11/22/2020 0927 Last data filed at 11/22/2020 0500 Gross per 24 hour  Intake 477 ml  Output 1226 ml  Net -749 ml   Filed Weights   11/18/20 0500 11/19/20 0500 11/20/20 0500  Weight: 88.6 kg 88.6 kg 88.6 kg    Exam: General exam: In no acute distress. Respiratory system: Good air movement and clear to auscultation. Cardiovascular system: S1 & S2 heard, RRR.  Positive JVD Gastrointestinal system: Abdomen is nondistended, soft and nontender.  Extremities: No pedal edema. Skin: No rashes, lesions or ulcers Psychiatry: Judgement and insight appear normal. Mood & affect appropriate.  Data Reviewed:    Labs: Basic Metabolic Panel: Recent Labs  Lab 11/16/20 0501 11/17/20 0345  NA 138 140  K 3.8 3.7  CL 104 105  CO2 25 26  GLUCOSE 157* 145*  BUN 19 20  CREATININE 1.04 1.06  CALCIUM 9.1 8.6*   GFR Estimated Creatinine Clearance: 83.4 mL/min (by C-G formula based on SCr of 1.06 mg/dL). Liver Function Tests: No results for input(s): AST, ALT, ALKPHOS, BILITOT, PROT, ALBUMIN in the last 168 hours. No results for input(s): LIPASE, AMYLASE in the last 168 hours. No results for input(s): AMMONIA in the last 168 hours. Coagulation profile Recent Labs  Lab 11/19/20 0457  INR 2.1*   COVID-19 Labs  No results for  input(s): DDIMER, FERRITIN, LDH, CRP in the last 72 hours.  Lab Results  Component Value Date   SARSCOV2NAA POSITIVE (A) 10/30/2020   SARSCOV2NAA NEGATIVE 07/07/2020   [place] NEGATIVE 12/23/2019    CBC: Recent Labs  Lab 11/19/20 0457  WBC 11.7*  HGB 9.7*  HCT 31.7*  MCV 102.3*  PLT 221   Cardiac Enzymes: No results for input(s): CKTOTAL, CKMB, CKMBINDEX, TROPONINI in the last 168 hours. BNP (last 3 results) No results for input(s): PROBNP in the last 8760 hours. CBG: Recent Labs  Lab 11/21/20 1213 11/21/20 1614 11/21/20 2026 11/22/20 0005 11/22/20 0916  GLUCAP 111* 290* 275* 210* 139*   D-Dimer: No results for input(s): DDIMER in the last 72 hours. Hgb A1c: No results for input(s): HGBA1C in the last 72 hours. Lipid Profile: No results for input(s): CHOL, HDL, LDLCALC, TRIG, CHOLHDL, LDLDIRECT in the last 72 hours. Thyroid function studies: No results for input(s): TSH, T4TOTAL,  T3FREE, THYROIDAB in the last 72 hours.  Invalid input(s): FREET3 Anemia work up: No results for input(s): VITAMINB12, FOLATE, FERRITIN, TIBC, IRON, RETICCTPCT in the last 72 hours. Sepsis Labs: Recent Labs  Lab 11/19/20 0457  WBC 11.7*   Microbiology No results found for this or any previous visit (from the past 240 hour(s)).   Medications:   . atorvastatin  20 mg Oral Daily  . Chlorhexidine Gluconate Cloth  6 each Topical Daily  . cholecalciferol  2,000 Units Oral BID  . dexamethasone  2 mg Oral Daily  . feeding supplement (GLUCERNA SHAKE)  237 mL Oral BID BM  . insulin aspart  0-15 Units Subcutaneous TID WC  . insulin aspart  0-5 Units Subcutaneous QHS  . insulin aspart  2 Units Subcutaneous TID WC  . insulin glargine  25 Units Subcutaneous BID  . midodrine  10 mg Oral TID WC  . multivitamin with minerals  1 tablet Oral Daily  . polyethylene glycol  17 g Oral BID  . senna-docusate  1 tablet Oral BID  . sodium chloride flush  10-40 mL Intracatheter Q12H  . sodium  chloride  1 g Oral q AM  . tamsulosin  0.4 mg Oral Daily  . warfarin  4 mg Oral q1600  . Warfarin - Pharmacist Dosing Inpatient   Does not apply q1600   Continuous Infusions:    LOS: 23 days   Charlynne Cousins  [facility]  11/22/2020, 9:27 AM

## 2020-11-22 NOTE — Plan of Care (Signed)

## 2020-11-22 NOTE — Progress Notes (Signed)
Occupational Therapy Treatment Patient Details Name: Dennis [name]. MRN: [id number] DOB: Mar 11, 1961 Today's Date: 11/22/2020    History of present illness Patient is a 60 y/o male with PMH significant for IDDM, HTN, HLD, CVA in 2016, lung cancer, and CAD on warfarin. Patient had mechanical fall resulting in L femur fx. Patient s/p IM nail L femur on 1/19. Covid+ on precautions as of 10/31/19; off prec as of 1/31   OT comments  Pt making good progress with functional goals. Pt participated in bathing and dressing tasks seated and standing a sink. Pt very pleasant and cooperative. OT will continue to follow acutely  Follow Up Recommendations  SNF    Equipment Recommendations  Other (comment);3 in 1 bedside commode (RW, TBD at next venue of care)    Recommendations for Other Services      Precautions / Restrictions Precautions Precautions: Fall Restrictions Weight Bearing Restrictions: Yes LLE Weight Bearing: Weight bearing as tolerated       Mobility Bed Mobility Overal bed mobility: Needs Assistance Bed Mobility: Sit to Supine     Supine to sit: Supervision;HOB elevated Sit to supine: Min guard   General bed mobility comments: Cues to hook RLE under LLE to move LLE off EOB  Transfers Overall transfer level: Needs assistance Equipment used: Rolling walker (2 wheeled) Transfers: Sit to/from Stand Sit to Stand: Min guard         General transfer comment: Cues for hand placement to and from seated surface.    Balance Overall balance assessment: Needs assistance Sitting-balance support: Feet supported;No upper extremity supported Sitting balance-Leahy Scale: Fair Sitting balance - Comments: performed UB dressing standing at sink   Standing balance support: Bilateral upper extremity supported;During functional activity Standing balance-Leahy Scale: Poor Standing balance comment: stood at sink for bathing and to don clean gown                            ADL either performed or assessed with clinical judgement   ADL Overall ADL's : Needs assistance/impaired     Grooming: Wash/dry hands;Wash/dry face;Oral care;Min guard;Standing           Upper Body Dressing : Min guard;Standing   Lower Body Dressing: Moderate assistance;Sitting/lateral leans;Sit to/from stand   Toilet Transfer: RW;Stand-pivot;Cueing for safety;Cueing for sequencing;Comfort height toilet;Grab bars;Ambulation;Min guard   Toileting- Clothing Manipulation and Hygiene: Sit to/from stand;Min guard       Functional mobility during ADLs: Rolling walker;Cueing for sequencing;Cueing for safety;Min guard General ADL Comments: pt required min A for balance/support standing at toilet with RW for posterior peri hygiene and at sink for bathing     Vision Baseline Vision/History: No visual deficits Patient Visual Report: No change from baseline     Perception     Praxis      Cognition Arousal/Alertness: Awake/alert Behavior During Therapy: WFL for tasks assessed/performed Overall Cognitive Status: Within Functional Limits for tasks assessed                                          Exercises General Exercises - Lower Extremity Ankle Circles/Pumps: AROM;Both;10 reps;Supine Quad Sets: AROM;Both;10 reps;Supine Long Arc Quad: AROM;Left;10 reps;Seated Heel Slides: AROM;Left;10 reps;Supine Hip ABduction/ADduction: AROM;Left;10 reps;Supine   Shoulder Instructions       General Comments      Pertinent Vitals/ Pain  Pain Assessment: Faces Faces Pain Scale: Hurts a little bit Pain Location: L LE in standing and with weight bearing Pain Descriptors / Indicators: Grimacing;Guarding Pain Intervention(s): Monitored during session;Repositioned  Home Living                                          Prior Functioning/Environment              Frequency  Min 2X/week        Progress Toward Goals  OT Goals(current  goals can now be found in the care plan section)  Progress towards OT goals: Progressing toward goals  Acute Rehab OT Goals Patient Stated Goal: to get to bathroom  Plan Discharge plan remains appropriate    Co-evaluation                 AM-PAC OT "6 Clicks" Daily Activity     Outcome Measure   Help from another person eating meals?: None Help from another person taking care of personal grooming?: A Little Help from another person toileting, which includes using toliet, bedpan, or urinal?: A Little Help from another person bathing (including washing, rinsing, drying)?: A Lot Help from another person to put on and taking off regular upper body clothing?: A Little Help from another person to put on and taking off regular lower body clothing?: A Lot 6 Click Score: 17    End of Session Equipment Utilized During Treatment: Gait belt;Rolling walker;Other (comment) (3 in 1)  OT Visit Diagnosis: Unsteadiness on feet (R26.81);Other abnormalities of gait and mobility (R26.89);History of falling (Z91.81);Pain Pain - Right/Left: Left Pain - part of body: Leg   Activity Tolerance     Patient Left in chair;with call bell/phone within reach;with chair alarm set   Nurse Communication          Time: 5465-6812 OT Time Calculation (min): 29 min  Charges: OT General Charges $OT Visit: 1 Visit OT Treatments $Self Care/Home Management : 8-22 mins $Therapeutic Activity: 8-22 mins     Britt Bottom 11/22/2020, 3:27 PM

## 2020-11-22 NOTE — Progress Notes (Signed)
Physical Therapy Treatment Patient Details Name: Dennis [name]. MRN: [id number] DOB: 10/16/1960 Today's Date: 11/22/2020    History of Present Illness Patient is a 60 y/o male with PMH significant for IDDM, HTN, HLD, CVA in 2016, lung cancer, and CAD on warfarin. Patient had mechanical fall resulting in L femur fx. Patient s/p IM nail L femur on 1/19. Covid+ on precautions as of 10/31/19; off prec as of 1/31    PT Comments    Pt seated in recliner requesting back to bed after sitting in chair x 2 hours.  Pt performed LE exercises and gt training before back to bed.  Continue to recommend snf placement at this time.    Follow Up Recommendations  SNF;Supervision/Assistance - 24 hour     Equipment Recommendations  Rolling walker with 5" wheels;3in1 (PT)    Recommendations for Other Services       Precautions / Restrictions Precautions Precautions: Fall Restrictions Weight Bearing Restrictions: Yes LLE Weight Bearing: Weight bearing as tolerated    Mobility  Bed Mobility Overal bed mobility: Needs Assistance Bed Mobility: Sit to Supine       Sit to supine: Min guard   General bed mobility comments: Cues to hook RLE under LLE to move LLE  back to bed.    Transfers Overall transfer level: Needs assistance Equipment used: Rolling walker (2 wheeled) Transfers: Sit to/from Stand Sit to Stand: Min guard         General transfer comment: Cues for hand placement to and from seated surface.  Ambulation/Gait Ambulation/Gait assistance: Min guard Gait Distance (Feet): 20 Feet Assistive device: Rolling walker (2 wheeled) Gait Pattern/deviations: Decreased stance time - left;Trunk flexed;Step-to pattern     General Gait Details: Cues for upper trunk control and RW safety.  Pt performed with antalgic gt and cues for relaxing shoulder to allow weight on LLE.   Stairs             Wheelchair Mobility    Modified Rankin (Stroke Patients Only)       Balance  Overall balance assessment: Needs assistance Sitting-balance support: Feet supported;No upper extremity supported Sitting balance-Leahy Scale: Fair Sitting balance - Comments: performed UB and LB dressing sitting EOB   Standing balance support: Bilateral upper extremity supported;During functional activity Standing balance-Leahy Scale: Poor                              Cognition Arousal/Alertness: Awake/alert Behavior During Therapy: WFL for tasks assessed/performed Overall Cognitive Status: Within Functional Limits for tasks assessed                                        Exercises General Exercises - Lower Extremity Ankle Circles/Pumps: AROM;Both;10 reps;Supine Quad Sets: AROM;Both;10 reps;Supine Long Arc Quad: AROM;Left;10 reps;Seated Heel Slides: AROM;Left;10 reps;Supine Hip ABduction/ADduction: AROM;Left;10 reps;Supine    General Comments        Pertinent Vitals/Pain Pain Assessment: Faces Faces Pain Scale: Hurts a little bit Pain Location: L LE in standing and with weight bearing Pain Descriptors / Indicators: Grimacing;Guarding Pain Intervention(s): Monitored during session;Repositioned    Home Living                      Prior Function            PT Goals (current goals can now be found  in the care plan section) Acute Rehab PT Goals Patient Stated Goal: to get to bathroom Potential to Achieve Goals: Good Progress towards PT goals: Progressing toward goals    Frequency    Min 3X/week      PT Plan Current plan remains appropriate;Other (comment)    Co-evaluation              AM-PAC PT "6 Clicks" Mobility   Outcome Measure  Help needed turning from your back to your side while in a flat bed without using bedrails?: None Help needed moving from lying on your back to sitting on the side of a flat bed without using bedrails?: A Little Help needed moving to and from a bed to a chair (including a wheelchair)?:  A Little Help needed standing up from a chair using your arms (e.g., wheelchair or bedside chair)?: A Little Help needed to walk in hospital room?: A Little Help needed climbing 3-5 steps with a railing? : A Little 6 Click Score: 19    End of Session Equipment Utilized During Treatment: Gait belt Activity Tolerance: Patient tolerated treatment well Patient left: in bed;with call bell/phone within reach;with bed alarm set Nurse Communication: Mobility status PT Visit Diagnosis: Unsteadiness on feet (R26.81);Other abnormalities of gait and mobility (R26.89);Muscle weakness (generalized) (M62.81);History of falling (Z91.81)     Time: 1243-1300 PT Time Calculation (min) (ACUTE ONLY): 17 min  Charges:  $Gait Training: 8-22 mins                     Erasmo Leventhal , PTA Acute Rehabilitation Services Pager [phone] Office [phone]     Halsey Persaud Eli Hose 11/22/2020, 2:58 PM

## 2020-11-23 DIAGNOSIS — S72402B Unspecified fracture of lower end of left femur, initial encounter for open fracture type I or II: Secondary | ICD-10-CM | POA: Diagnosis not present

## 2020-11-23 DIAGNOSIS — S72402A Unspecified fracture of lower end of left femur, initial encounter for closed fracture: Secondary | ICD-10-CM | POA: Diagnosis not present

## 2020-11-23 DIAGNOSIS — C7931 Secondary malignant neoplasm of brain: Secondary | ICD-10-CM | POA: Diagnosis not present

## 2020-11-23 DIAGNOSIS — I5022 Chronic systolic (congestive) heart failure: Secondary | ICD-10-CM | POA: Diagnosis not present

## 2020-11-23 LAB — GLUCOSE, CAPILLARY
Glucose-Capillary: 75 mg/dL (ref 70–99)
Glucose-Capillary: 75 mg/dL (ref 70–99)
Glucose-Capillary: 172 mg/dL — ABNORMAL HIGH (ref 70–99)
Glucose-Capillary: 199 mg/dL — ABNORMAL HIGH (ref 70–99)

## 2020-11-23 LAB — BASIC METABOLIC PANEL
Anion gap: 8 (ref 5–15)
BUN: 27 mg/dL — ABNORMAL HIGH (ref 6–20)
CO2: 25 mmol/L (ref 22–32)
Calcium: 8.8 mg/dL — ABNORMAL LOW (ref 8.9–10.3)
Chloride: 105 mmol/L (ref 98–111)
Creatinine, Ser: 1.23 mg/dL (ref 0.61–1.24)
GFR, Estimated: >60 mL/min (ref >60)
Glucose, Bld: 156 mg/dL — ABNORMAL HIGH (ref 70–99)
Potassium: 3.8 mmol/L (ref 3.5–5.1)
Sodium: 138 mmol/L (ref 135–145)

## 2020-11-23 NOTE — Progress Notes (Signed)
[facility] PROGRESS NOTE    Progress Note  Dennis Neila Gear.  HWE:[id number] DOB: 1961-03-08 DOA: 10/30/2020 PCP: [name], MD     Brief Narrative:   Dennis Bogus. is an 60 y.o. 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 diagnosed on 12/31/2019, diabetes mellitus type 2 remote history of alcohol abuse presents with a fall that led to a left distal femur fracture, was found to be incidentally Covid positive on the right surgical repair  Awaiting skilled nursing facility placement  Significant Events: 10/31/2020 intramedullary femoral nailing  Significant studies: 10/30/2020 displaced and angulated spiral fracture of the distal femur  Antibiotics: None  Microbiology data: Blood culture:  Procedures: 11/13/2018 post intramedullary rod fixation of the distal left femur fracture  Assessment/Plan:    Femoral distal fracture ([place]): He continues to work with physical therapy last evaluation showed that he will need to go to skilled nursing facility. Patient is medically stable for transfer.  Incidental COVID-19 infection: He received 3 days of prophylactic IV remdesivir. Now back on his home dose of Decadron.  Acute respiratory failure with hypoxia: Initially was there was concern of heart failure he was given an IV Lasix and now resolved.  Chronic systolic heart failure: With an EF of 25 to 30%, continue current medication. Blood pressure is stable tolerating Lasix potassium is 3.8 continue supplementation as he is on Lasix  Normocytic anemia/anemia of chronic disease: Hemoglobin is stable continue iron as an outpatient.  Constipation: Resolved with MiraLAX.  Hypotension: Continue midodrine.  Diabetes mellitus type 2: Continue long-acting insulin plus sliding scale.  Acute kidney injury With a baseline creatinine of 0.9, on  admission 1.5 improved with IV Lasix.  History of stroke: Continue Coumadin.  Small cell metastatic brain cancer: Continue whole brain radiation and chemotherapy as an outpatient continue dexamethasone.  DVT prophylaxis: lovenxo Family Communication:none Status is: Inpatient  Remains inpatient appropriate because:Hemodynamically unstable   Dispo:  Patient From: Home  Planned Disposition: [place]  Expected discharge date: 11/26/2020  Medically stable for discharge: Yes  Difficult to place patient: Yes    Code Status:     Code Status Orders  (From admission, onward)         Start     Ordered   10/30/20 1530  Full code  Continuous        10/30/20 1538        Code Status History    Date Active Date Inactive Code Status Order ID Comments User Context   07/08/2020 0127 07/25/2020 2346 Full Code [id number]  Vernelle Emerald, MD ED   12/23/2019 1438 12/28/2019 2357 Full Code [id number]  Richarda Osmond, DO Inpatient   09/04/2018 2356 09/05/2018 1650 Full Code [id number]  Phillips Grout, MD ED   08/17/2015 2244 08/23/2015 2157 Full Code [id number]  Harriet Butte, NP Inpatient   08/17/2015 0032 08/17/2015 2244 Full Code [id number]  Rebeca Allegra ED   07/16/2015 1846 07/17/2015 1836 Full Code [id number]  Davonna Belling, MD ED   02/07/2015 2004 02/14/2015 1629 Full Code [id number]  Bary Leriche, PA-C Inpatient   02/07/2015 1030 02/07/2015 2004 Full Code [id number]  Jolaine Artist, MD Inpatient   02/01/2015 1353 02/07/2015 1030 Full Code [id number]  Hosie Poisson, MD Inpatient   01/26/2015 1821 02/01/2015 1353 Full Code [id number]  Love, Ivan Anchors,  PA-C Inpatient   01/24/2015 1914 01/26/2015 1821 Full Code [id number]  Annita Brod, MD Inpatient   09/21/2014 1634 09/25/2014 1640 Full Code [id number]  Conrad Maunaloa, NP Inpatient   05/30/2014 1445 06/01/2014 1725 Full Code [id number]  Jolaine Artist, MD Inpatient   05/26/2014 1901 05/30/2014 1445 Full Code  [id number]  Blain Pais, MD Inpatient   08/09/2013 1426 08/10/2013 1705 Full Code [id number]  Ivor Costa, MD Inpatient   Advance Care Planning Activity        IV Access:    Peripheral IV   Procedures and diagnostic studies:   No results found.   Medical Consultants:    None.   Subjective:    Dennis Anello. has no new complaints this morning.  Objective:    Vitals:   11/22/20 1500 11/22/20 1956 11/23/20 0413 11/23/20 0842  BP: 116/85 103/83 105/86 93/80  Pulse: 92 99 93 91  Resp: 18 18 18 17   Temp: 97.6 F (36.4 C) 98.3 F (36.8 C) 98.1 F (36.7 C) 98.2 F (36.8 C)  TempSrc: Oral   Oral  SpO2: 99% 94% 94% 99%  Weight:      Height:       SpO2: 99 % O2 Flow Rate (L/min): 3 L/min   Intake/Output Summary (Last 24 hours) at 11/23/2020 0908 Last data filed at 11/23/2020 0500 Gross per 24 hour  Intake 480 ml  Output 2000 ml  Net -1520 ml   Filed Weights   11/18/20 0500 11/19/20 0500 11/20/20 0500  Weight: 88.6 kg 88.6 kg 88.6 kg    Exam: General exam: In no acute distress. Respiratory system: Good air movement and clear to auscultation. Cardiovascular system: S1 & S2 heard, RRR. No JVD. Gastrointestinal system: Abdomen is nondistended, soft and nontender.  Extremities: No pedal edema. Skin: No rashes, lesions or ulcers Psychiatry: Judgement and insight appear normal. Mood & affect appropriate.  Data Reviewed:    Labs: Basic Metabolic Panel: Recent Labs  Lab 11/17/20 0345 11/23/20 0306  NA 140 138  K 3.7 3.8  CL 105 105  CO2 26 25  GLUCOSE 145* 156*  BUN 20 27*  CREATININE 1.06 1.23  CALCIUM 8.6* 8.8*   GFR Estimated Creatinine Clearance: 71.9 mL/min (by C-G formula based on SCr of 1.23 mg/dL). Liver Function Tests: No results for input(s): AST, ALT, ALKPHOS, BILITOT, PROT, ALBUMIN in the last 168 hours. No results for input(s): LIPASE, AMYLASE in the last 168 hours. No results for input(s): AMMONIA in the last 168  hours. Coagulation profile Recent Labs  Lab 11/19/20 0457  INR 2.1*   COVID-19 Labs  No results for input(s): DDIMER, FERRITIN, LDH, CRP in the last 72 hours.  Lab Results  Component Value Date   SARSCOV2NAA POSITIVE (A) 10/30/2020   SARSCOV2NAA NEGATIVE 07/07/2020   [place] NEGATIVE 12/23/2019    CBC: Recent Labs  Lab 11/19/20 0457  WBC 11.7*  HGB 9.7*  HCT 31.7*  MCV 102.3*  PLT 221   Cardiac Enzymes: No results for input(s): CKTOTAL, CKMB, CKMBINDEX, TROPONINI in the last 168 hours. BNP (last 3 results) No results for input(s): PROBNP in the last 8760 hours. CBG: Recent Labs  Lab 11/22/20 1145 11/22/20 1623 11/22/20 1952 11/22/20 2117 11/23/20 0648  GLUCAP 109* 137* 184* 150* 75   D-Dimer: No results for input(s): DDIMER in the last 72 hours. Hgb A1c: No results for input(s): HGBA1C in the last 72 hours. Lipid Profile: No results for input(s): CHOL, HDL, LDLCALC, TRIG, CHOLHDL,  LDLDIRECT in the last 72 hours. Thyroid function studies: No results for input(s): TSH, T4TOTAL, T3FREE, THYROIDAB in the last 72 hours.  Invalid input(s): FREET3 Anemia work up: No results for input(s): VITAMINB12, FOLATE, FERRITIN, TIBC, IRON, RETICCTPCT in the last 72 hours. Sepsis Labs: Recent Labs  Lab 11/19/20 0457  WBC 11.7*   Microbiology No results found for this or any previous visit (from the past 240 hour(s)).   Medications:   . atorvastatin  20 mg Oral Daily  . Chlorhexidine Gluconate Cloth  6 each Topical Daily  . cholecalciferol  2,000 Units Oral BID  . dexamethasone  2 mg Oral Daily  . feeding supplement (GLUCERNA SHAKE)  237 mL Oral BID BM  . furosemide  20 mg Oral Daily  . insulin aspart  0-15 Units Subcutaneous TID WC  . insulin aspart  0-5 Units Subcutaneous QHS  . insulin aspart  2 Units Subcutaneous TID WC  . insulin glargine  25 Units Subcutaneous BID  . midodrine  10 mg Oral TID WC  . multivitamin with minerals  1 tablet Oral Daily  .  polyethylene glycol  17 g Oral BID  . potassium chloride  10 mEq Oral Daily  . senna-docusate  1 tablet Oral BID  . sodium chloride flush  10-40 mL Intracatheter Q12H  . sodium chloride  1 g Oral q AM  . tamsulosin  0.4 mg Oral Daily  . warfarin  4 mg Oral q1600  . Warfarin - Pharmacist Dosing Inpatient   Does not apply q1600   Continuous Infusions:    LOS: 24 days   Charlynne Cousins  [facility]  11/23/2020, 9:08 AM

## 2020-11-23 NOTE — Care Management Important Message (Signed)
Important Message  Patient Details  Name: Dennis [name]. MRN: [id number] Date of Birth: Aug 24, 1961   Medicare Important Message Given:  Yes - Important Message mailed due to current National Emergency   Verbal consent obtained due to current National Emergency  Relationship to patient: Self Contact Name: Quinntin Malter Call Date: 11/23/20  Time: 1305 Phone: [phone] Outcome: No Answer/Busy Important Message mailed to: Patient address on file    Delorse Lek 11/23/2020, 1:05 PM

## 2020-11-24 DIAGNOSIS — I5022 Chronic systolic (congestive) heart failure: Secondary | ICD-10-CM | POA: Diagnosis not present

## 2020-11-24 DIAGNOSIS — S72402A Unspecified fracture of lower end of left femur, initial encounter for closed fracture: Secondary | ICD-10-CM | POA: Diagnosis not present

## 2020-11-24 DIAGNOSIS — C7931 Secondary malignant neoplasm of brain: Secondary | ICD-10-CM | POA: Diagnosis not present

## 2020-11-24 DIAGNOSIS — S72402B Unspecified fracture of lower end of left femur, initial encounter for open fracture type I or II: Secondary | ICD-10-CM | POA: Diagnosis not present

## 2020-11-24 LAB — GLUCOSE, CAPILLARY
Glucose-Capillary: 57 mg/dL — ABNORMAL LOW (ref 70–99)
Glucose-Capillary: 63 mg/dL — ABNORMAL LOW (ref 70–99)
Glucose-Capillary: 93 mg/dL (ref 70–99)
Glucose-Capillary: 99 mg/dL (ref 70–99)
Glucose-Capillary: 278 mg/dL — ABNORMAL HIGH (ref 70–99)
Glucose-Capillary: 212 mg/dL — ABNORMAL HIGH (ref 70–99)

## 2020-11-24 LAB — BASIC METABOLIC PANEL
Anion gap: 9 (ref 5–15)
BUN: 30 mg/dL — ABNORMAL HIGH (ref 6–20)
CO2: 26 mmol/L (ref 22–32)
Calcium: 9 mg/dL (ref 8.9–10.3)
Chloride: 106 mmol/L (ref 98–111)
Creatinine, Ser: 1.3 mg/dL — ABNORMAL HIGH (ref 0.61–1.24)
GFR, Estimated: >60 mL/min (ref >60)
Glucose, Bld: 87 mg/dL (ref 70–99)
Potassium: 3.9 mmol/L (ref 3.5–5.1)
Sodium: 141 mmol/L (ref 135–145)

## 2020-11-24 MED ORDER — INSULIN GLARGINE 100 UNIT/ML ~~LOC~~ SOLN
20.0000 [IU] | Freq: Two times a day (BID) | SUBCUTANEOUS | Status: DC
  Administered 2020-11-24 – 2020-11-28 (×8): 20 [IU] via SUBCUTANEOUS
  Filled 2020-11-24 (×9): qty 0.2

## 2020-11-24 NOTE — Progress Notes (Signed)
Physical Therapy Treatment Patient Details Name: Dennis [name]. MRN: [id number] DOB: 1961-08-23 Today's Date: 11/24/2020    History of Present Illness Patient is a 60 y/o male with PMH significant for IDDM, HTN, HLD, CVA in 2016, lung cancer, and CAD on warfarin. Patient had mechanical fall resulting in L femur fx. Patient s/p IM nail L femur on 1/19. Covid+ on precautions as of 10/31/19; off prec as of 1/31    PT Comments    The pt was able to demo good progress with ambulation distance and gait mechanics at this time. Pt sitting on soiled linens upon arrival of PT, stated he had attempted to use urinal in bed, but otherwise unaware that linens were soaked. Pt was able to tolerate static stance for pericare, followed by two bouts of 45 ft ambulation with use of RW. Pt with improved wt acceptance and heel strike with LLE during gait. The pt continues to benefit from cues for posture, prompts for self-monitoring energy expenditure, and hand positioning with transfers. The pt will continue to progress with therapy and benefit from SNF rehab to facilitate improved safety and independence with mobility.    Follow Up Recommendations  SNF;Supervision/Assistance - 24 hour     Equipment Recommendations  Rolling walker with 5" wheels;3in1 (PT)    Recommendations for Other Services       Precautions / Restrictions Precautions Precautions: Fall Restrictions Weight Bearing Restrictions: Yes LLE Weight Bearing: Weight bearing as tolerated    Mobility  Bed Mobility Overal bed mobility: Needs Assistance Bed Mobility: Supine to Sit     Supine to sit: Min assist     General bed mobility comments: minA to LLE only to move toEOB, pt then able to raise trunk and reposition at EOB without assist    Transfers Overall transfer level: Needs assistance Equipment used: Rolling walker (2 wheeled) Transfers: Sit to/from Stand Sit to Stand: Min guard         General transfer comment: cues for  hand placement (pt able to correct when asked "is that where your hands go to stand?"). no physical assist to power up but increased time to rise  Ambulation/Gait Ambulation/Gait assistance: Min guard Gait Distance (Feet): 45 Feet (+ 54ft) Assistive device: Rolling walker (2 wheeled) Gait Pattern/deviations: Decreased stance time - left;Trunk flexed;Step-through pattern;Step-to pattern Gait velocity: decreased   General Gait Details: cues for posture and heel strike with LLE, pt able to manage RW. cues for self-monitoring energy and need to rest       Balance Overall balance assessment: Needs assistance Sitting-balance support: Feet supported;No upper extremity supported Sitting balance-Leahy Scale: Fair     Standing balance support: Bilateral upper extremity supported;During functional activity Standing balance-Leahy Scale: Poor Standing balance comment: reliant on BUE support for dynamic stance                            Cognition Arousal/Alertness: Awake/alert Behavior During Therapy: WFL for tasks assessed/performed Overall Cognitive Status: Within Functional Limits for tasks assessed                                 General Comments: Pt linens and gown soaked with urine, pt stated he was trying to use urinal but unaware that bed was wet. Able to follow commands well, but benefits from prompts for energy conservation  Pertinent Vitals/Pain Pain Assessment: Faces Faces Pain Scale: Hurts a little bit Pain Location: L LE in standing and with weight bearing Pain Descriptors / Indicators: Grimacing;Guarding Pain Intervention(s): Monitored during session;Limited activity within patient's tolerance     PT Goals (current goals can now be found in the care plan section) Acute Rehab PT Goals Patient Stated Goal: to leave hospital PT Goal Formulation: With patient Time For Goal Achievement: 12/03/20 Potential to Achieve Goals:  Good Progress towards PT goals: Progressing toward goals    Frequency    Min 3X/week      PT Plan Current plan remains appropriate;Other (comment)       AM-PAC PT "6 Clicks" Mobility   Outcome Measure  Help needed turning from your back to your side while in a flat bed without using bedrails?: None Help needed moving from lying on your back to sitting on the side of a flat bed without using bedrails?: A Little Help needed moving to and from a bed to a chair (including a wheelchair)?: A Little Help needed standing up from a chair using your arms (e.g., wheelchair or bedside chair)?: A Little Help needed to walk in hospital room?: A Little Help needed climbing 3-5 steps with a railing? : A Little 6 Click Score: 19    End of Session Equipment Utilized During Treatment: Gait belt Activity Tolerance: Patient tolerated treatment well Patient left: with call bell/phone within reach;in chair;with chair alarm set Nurse Communication: Mobility status PT Visit Diagnosis: Unsteadiness on feet (R26.81);Other abnormalities of gait and mobility (R26.89);Muscle weakness (generalized) (M62.81);History of falling (Z91.81)     Time: 3300-7622 PT Time Calculation (min) (ACUTE ONLY): 24 min  Charges:  $Gait Training: 23-37 mins                     Karma Ganja, PT, DPT   Acute Rehabilitation Department Pager #: [phone]   Otho Bellows 11/24/2020, 5:03 PM

## 2020-11-24 NOTE — Progress Notes (Signed)
[facility] PROGRESS NOTE    Progress Note  Dennis [name].  JSE:[id number] DOB: 10/12/1961 DOA: 10/30/2020 PCP: [name], MD     Brief Narrative:   Dennis Stieg. is an 60 y.o. 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 diagnosed on 12/31/2019, diabetes mellitus type 2 remote history of alcohol abuse presents with a fall that led to a left distal femur fracture, was found to be incidentally Covid positive on the right surgical repair  Awaiting skilled nursing facility placement  Significant Events: 10/31/2020 intramedullary femoral nailing  Significant studies: 10/30/2020 displaced and angulated spiral fracture of the distal femur  Antibiotics: None  Microbiology data: Blood culture:  Procedures: 11/13/2018 post intramedullary rod fixation of the distal left femur fracture  Assessment/Plan:    Femoral distal fracture ([place]): He continues to work with physical therapy they still recommend skilled nursing facility as he lives alone. Patient is stable for discharge. Try to get out of bed to chair.  Incidental COVID-19 infection: He received 3 days of prophylactic IV remdesivir. Now back on his home dose of Decadron.  Acute respiratory failure with hypoxia: Initially there was a concern for volume overload he was given IV Lasix and this resolved.  Chronic systolic heart failure: With an EF of 25 to 30%, continue current medication. Blood pressure stable continue Lasix, continue potassium supplementation.  Normocytic anemia/anemia of chronic disease: Hemoglobin is stable continue iron as an outpatient.  Constipation: Resolved with MiraLAX.  Hypotension: Continue midodrine.  Diabetes mellitus type 2 per glycemia: Decrease long-acting insulin continue sliding scale he had an episode of hypoglycemia this morning.  Acute kidney  injury With a baseline creatinine of 0.9, on admission 1.5 improved with IV Lasix.  History of stroke: Continue Coumadin.  Small cell metastatic brain cancer: Continue whole brain radiation and chemotherapy as an outpatient continue dexamethasone.  DVT prophylaxis: lovenxo Family Communication:none Status is: Inpatient  Remains inpatient appropriate because:Hemodynamically unstable   Dispo:  Patient From: Home  Planned Disposition: [place]  Expected discharge date: 11/26/2020  Medically stable for discharge: Yes  Difficult to place patient: Yes    Code Status:     Code Status Orders  (From admission, onward)         Start     Ordered   10/30/20 1530  Full code  Continuous        10/30/20 1538        Code Status History    Date Active Date Inactive Code Status Order ID Comments User Context   07/08/2020 0127 07/25/2020 2346 Full Code [id number]  Vernelle Emerald, MD ED   12/23/2019 1438 12/28/2019 2357 Full Code [id number]  Richarda Osmond, DO Inpatient   09/04/2018 2356 09/05/2018 1650 Full Code [id number]  Phillips Grout, MD ED   08/17/2015 2244 08/23/2015 2157 Full Code [id number]  Harriet Butte, NP Inpatient   08/17/2015 0032 08/17/2015 2244 Full Code [id number]  Rebeca Allegra ED   07/16/2015 1846 07/17/2015 1836 Full Code [id number]  Davonna Belling, MD ED   02/07/2015 2004 02/14/2015 1629 Full Code [id number]  Bary Leriche, PA-C Inpatient   02/07/2015 1030 02/07/2015 2004 Full Code [id number]  Jolaine Artist, MD Inpatient   02/01/2015 1353 02/07/2015 1030 Full Code [id number]  Hosie Poisson, MD Inpatient   01/26/2015 1821 02/01/2015 1353 Full Code  [id number]  Flora Lipps Inpatient   01/24/2015 1914 01/26/2015 1821 Full Code [id number]  Annita Brod, MD Inpatient   09/21/2014 1634 09/25/2014 1640 Full Code [id number]  Conrad [place], NP Inpatient   05/30/2014 1445 06/01/2014 1725 Full Code [id number]  Jolaine Artist, MD Inpatient    05/26/2014 1901 05/30/2014 1445 Full Code [id number]  Blain Pais, MD Inpatient   08/09/2013 1426 08/10/2013 1705 Full Code [id number]  Ivor Costa, MD Inpatient   Advance Care Planning Activity        IV Access:    Peripheral IV   Procedures and diagnostic studies:   No results found.   Medical Consultants:    None.   Subjective:    Dennis Haye. has no new complaints this morning feels great.  Objective:    Vitals:   11/23/20 1453 11/23/20 1956 11/24/20 0428 11/24/20 0808  BP: 106/86 116/85 110/80 102/78  Pulse: (!) 102 (!) 103 97 96  Resp: 17 16 18 18   Temp: 97.9 F (36.6 C) 98.6 F (37 C) 98 F (36.7 C) (!) 97.5 F (36.4 C)  TempSrc: Oral  Oral Oral  SpO2: 95% 92% 97% 99%  Weight:   88.6 kg   Height:       SpO2: 99 % O2 Flow Rate (L/min): 3 L/min   Intake/Output Summary (Last 24 hours) at 11/24/2020 0936 Last data filed at 11/23/2020 2300 Gross per 24 hour  Intake 240 ml  Output 750 ml  Net -510 ml   Filed Weights   11/19/20 0500 11/20/20 0500 11/24/20 0428  Weight: 88.6 kg 88.6 kg 88.6 kg    Exam: General exam: In no acute distress. Respiratory system: Good air movement and clear to auscultation. Cardiovascular system: S1 & S2 heard, RRR. No JVD. Gastrointestinal system: Abdomen is nondistended, soft and nontender.  Extremities: No pedal edema. Skin: No rashes, lesions or ulcers Psychiatry: Judgement and insight appear normal. Mood & affect appropriate.  Data Reviewed:    Labs: Basic Metabolic Panel: Recent Labs  Lab 11/23/20 0306 11/24/20 0323  NA 138 141  K 3.8 3.9  CL 105 106  CO2 25 26  GLUCOSE 156* 87  BUN 27* 30*  CREATININE 1.23 1.30*  CALCIUM 8.8* 9.0   GFR Estimated Creatinine Clearance: 68 mL/min (A) (by C-G formula based on SCr of 1.3 mg/dL (H)). Liver Function Tests: No results for input(s): AST, ALT, ALKPHOS, BILITOT, PROT, ALBUMIN in the last 168 hours. No results for input(s): LIPASE, AMYLASE in  the last 168 hours. No results for input(s): AMMONIA in the last 168 hours. Coagulation profile Recent Labs  Lab 11/19/20 0457  INR 2.1*   COVID-19 Labs  No results for input(s): DDIMER, FERRITIN, LDH, CRP in the last 72 hours.  Lab Results  Component Value Date   SARSCOV2NAA POSITIVE (A) 10/30/2020   SARSCOV2NAA NEGATIVE 07/07/2020   [place] NEGATIVE 12/23/2019    CBC: Recent Labs  Lab 11/19/20 0457  WBC 11.7*  HGB 9.7*  HCT 31.7*  MCV 102.3*  PLT 221   Cardiac Enzymes: No results for input(s): CKTOTAL, CKMB, CKMBINDEX, TROPONINI in the last 168 hours. BNP (last 3 results) No results for input(s): PROBNP in the last 8760 hours. CBG: Recent Labs  Lab 11/23/20 1618 11/23/20 1955 11/24/20 0639 11/24/20 0724 11/24/20 0756  GLUCAP 172* 199* 57* 63* 93   D-Dimer: No results for input(s): DDIMER in the last 72 hours. Hgb A1c: No results for input(s): HGBA1C in the last  72 hours. Lipid Profile: No results for input(s): CHOL, HDL, LDLCALC, TRIG, CHOLHDL, LDLDIRECT in the last 72 hours. Thyroid function studies: No results for input(s): TSH, T4TOTAL, T3FREE, THYROIDAB in the last 72 hours.  Invalid input(s): FREET3 Anemia work up: No results for input(s): VITAMINB12, FOLATE, FERRITIN, TIBC, IRON, RETICCTPCT in the last 72 hours. Sepsis Labs: Recent Labs  Lab 11/19/20 0457  WBC 11.7*   Microbiology No results found for this or any previous visit (from the past 240 hour(s)).   Medications:   . atorvastatin  20 mg Oral Daily  . Chlorhexidine Gluconate Cloth  6 each Topical Daily  . cholecalciferol  2,000 Units Oral BID  . dexamethasone  2 mg Oral Daily  . feeding supplement (GLUCERNA SHAKE)  237 mL Oral BID BM  . furosemide  20 mg Oral Daily  . insulin aspart  0-15 Units Subcutaneous TID WC  . insulin aspart  0-5 Units Subcutaneous QHS  . insulin aspart  2 Units Subcutaneous TID WC  . insulin glargine  25 Units Subcutaneous BID  . midodrine  10 mg  Oral TID WC  . multivitamin with minerals  1 tablet Oral Daily  . polyethylene glycol  17 g Oral BID  . potassium chloride  10 mEq Oral Daily  . senna-docusate  1 tablet Oral BID  . sodium chloride flush  10-40 mL Intracatheter Q12H  . sodium chloride  1 g Oral q AM  . tamsulosin  0.4 mg Oral Daily  . warfarin  4 mg Oral q1600  . Warfarin - Pharmacist Dosing Inpatient   Does not apply q1600   Continuous Infusions:    LOS: 25 days   Charlynne Cousins  [facility]  11/24/2020, 9:36 AM

## 2020-11-24 NOTE — TOC Progression Note (Signed)
Transition of Care (TOC) - Progression Note    Patient Details  Name: Dennis [name]. MRN: [id number] Date of Birth: 1960/11/11  Transition of Care [facility]) CM/SW [place], [place] Phone Number: 11/24/2020, 12:31 PM  Clinical Narrative:    Pt does not have any bed offers.  TOC team will continue to follow for disposition.   Expected Discharge Plan: Skilled Nursing Facility Barriers to Discharge: Continued Medical Work up,No SNF bed  Expected Discharge Plan and Services Expected Discharge Plan: [place] Choice: [place] arrangements for the past 2 months: Single Family Home Expected Discharge Date: 11/06/20                                     Social Determinants of Health (SDOH) Interventions    Readmission Risk Interventions No flowsheet data found.

## 2020-11-25 DIAGNOSIS — S72402A Unspecified fracture of lower end of left femur, initial encounter for closed fracture: Secondary | ICD-10-CM | POA: Diagnosis not present

## 2020-11-25 DIAGNOSIS — C7931 Secondary malignant neoplasm of brain: Secondary | ICD-10-CM | POA: Diagnosis not present

## 2020-11-25 LAB — BASIC METABOLIC PANEL
Anion gap: 7 (ref 5–15)
BUN: 35 mg/dL — ABNORMAL HIGH (ref 6–20)
CO2: 27 mmol/L (ref 22–32)
Calcium: 8.7 mg/dL — ABNORMAL LOW (ref 8.9–10.3)
Chloride: 105 mmol/L (ref 98–111)
Creatinine, Ser: 1.33 mg/dL — ABNORMAL HIGH (ref 0.61–1.24)
GFR, Estimated: >60 mL/min (ref >60)
Glucose, Bld: 177 mg/dL — ABNORMAL HIGH (ref 70–99)
Potassium: 3.8 mmol/L (ref 3.5–5.1)
Sodium: 139 mmol/L (ref 135–145)

## 2020-11-25 LAB — GLUCOSE, CAPILLARY
Glucose-Capillary: 124 mg/dL — ABNORMAL HIGH (ref 70–99)
Glucose-Capillary: 94 mg/dL (ref 70–99)
Glucose-Capillary: 122 mg/dL — ABNORMAL HIGH (ref 70–99)
Glucose-Capillary: 184 mg/dL — ABNORMAL HIGH (ref 70–99)

## 2020-11-25 NOTE — Plan of Care (Signed)

## 2020-11-25 NOTE — Progress Notes (Signed)
[facility] PROGRESS NOTE    Progress Note  Dennis [name].  HQI:[id number] DOB: 11-Dec-1960 DOA: 10/30/2020 PCP: [name], MD     Brief Narrative:   Dennis Niblett. is an 60 y.o. 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 diagnosed on 12/31/2019, diabetes mellitus type 2 remote history of alcohol abuse presents with a fall that led to a left distal femur fracture, was found to be incidentally Covid positive on the right surgical repair  Awaiting skilled nursing facility placement  Significant Events: 10/31/2020 intramedullary femoral nailing  Significant studies: 10/30/2020 displaced and angulated spiral fracture of the distal femur  Antibiotics: None  Microbiology data: Blood culture:  Procedures: 11/13/2018 post intramedullary rod fixation of the distal left femur fracture  Assessment/Plan:    Femoral distal fracture ([place]): He continues to work with physical therapy they still recommend skilled nursing facility as he lives alone. Patient is stable for discharge. Try to get out of bed to chair.  Incidental COVID-19 infection: He received 3 days of prophylactic IV remdesivir. Now back on his home dose of Decadron.  Acute respiratory failure with hypoxia: Initially there was a concern for volume overload he was given IV Lasix and this resolved.  Chronic systolic heart failure: With an EF of 25 to 30%, continue current medication. Blood pressure stable continue Lasix, continue potassium supplementation.  Normocytic anemia/anemia of chronic disease: Hemoglobin is stable continue iron as an outpatient.  Constipation: Resolved with MiraLAX.  Hypotension: Continue midodrine.  Diabetes mellitus type 2 per glycemia: Decrease long-acting insulin continue sliding scale he had an episode of hypoglycemia this morning.  Acute kidney  injury With a baseline creatinine of 0.9, on admission 1.5 improved with IV Lasix.  History of stroke: Continue Coumadin.  Small cell metastatic brain cancer: Continue whole brain radiation and chemotherapy as an outpatient continue dexamethasone.  DVT prophylaxis: lovenxo Family Communication:none Status is: Inpatient  Remains inpatient appropriate because:Hemodynamically unstable   Dispo:  Patient From: Home  Planned Disposition: [place]  Expected discharge date: 11/26/2020  Medically stable for discharge: Yes  Difficult to place patient: Yes    Code Status:     Code Status Orders  (From admission, onward)         Start     Ordered   10/30/20 1530  Full code  Continuous        10/30/20 1538        Code Status History    Date Active Date Inactive Code Status Order ID Comments User Context   07/08/2020 0127 07/25/2020 2346 Full Code [id number]  Vernelle Emerald, MD ED   12/23/2019 1438 12/28/2019 2357 Full Code [id number]  Richarda Osmond, DO Inpatient   09/04/2018 2356 09/05/2018 1650 Full Code [id number]  Phillips Grout, MD ED   08/17/2015 2244 08/23/2015 2157 Full Code [id number]  Harriet Butte, NP Inpatient   08/17/2015 0032 08/17/2015 2244 Full Code [id number]  Rebeca Allegra ED   07/16/2015 1846 07/17/2015 1836 Full Code [id number]  Davonna Belling, MD ED   02/07/2015 2004 02/14/2015 1629 Full Code [id number]  Bary Leriche, PA-C Inpatient   02/07/2015 1030 02/07/2015 2004 Full Code [id number]  Jolaine Artist, MD Inpatient   02/01/2015 1353 02/07/2015 1030 Full Code [id number]  Hosie Poisson, MD Inpatient   01/26/2015 1821 02/01/2015 1353 Full Code  [id number]  Flora Lipps Inpatient   01/24/2015 1914 01/26/2015 1821 Full Code [id number]  Annita Brod, MD Inpatient   09/21/2014 1634 09/25/2014 1640 Full Code [id number]  Conrad Emsworth, NP Inpatient   05/30/2014 1445 06/01/2014 1725 Full Code [id number]  Jolaine Artist, MD Inpatient    05/26/2014 1901 05/30/2014 1445 Full Code [id number]  Blain Pais, MD Inpatient   08/09/2013 1426 08/10/2013 1705 Full Code [id number]  Ivor Costa, MD Inpatient   Advance Care Planning Activity        IV Access:    Peripheral IV   Procedures and diagnostic studies:   No results found.   Medical Consultants:    None.   Subjective:    Dennis [name]. has no new complaints this morning feels great.  Objective:    Vitals:   11/24/20 1339 11/24/20 1949 11/25/20 0500 11/25/20 0900  BP: 118/71 (!) 109/91 123/73 108/60  Pulse: 93 (!) 105 96 97  Resp: (!) 22 16 18    Temp: 97.9 F (36.6 C) 98.8 F (37.1 C) 98 F (36.7 C) 98.3 F (36.8 C)  TempSrc: Oral  Axillary Oral  SpO2: 95% 95% 98% 97%  Weight:   85.8 kg   Height:       SpO2: 97 % O2 Flow Rate (L/min): 3 L/min  No intake or output data in the 24 hours ending 11/25/20 0939 Filed Weights   11/20/20 0500 11/24/20 0428 11/25/20 0500  Weight: 88.6 kg 88.6 kg 85.8 kg    Exam: General exam: In no acute distress. Respiratory system: Good air movement and clear to auscultation. Cardiovascular system: S1 & S2 heard, RRR. No JVD. Gastrointestinal system: Abdomen is nondistended, soft and nontender.  Extremities: No pedal edema. Skin: No rashes, lesions or ulcers Psychiatry: Judgement and insight appear normal. Mood & affect appropriate.  Data Reviewed:    Labs: Basic Metabolic Panel: Recent Labs  Lab 11/23/20 0306 11/24/20 0323 11/25/20 0354  NA 138 141 139  K 3.8 3.9 3.8  CL 105 106 105  CO2 25 26 27   GLUCOSE 156* 87 177*  BUN 27* 30* 35*  CREATININE 1.23 1.30* 1.33*  CALCIUM 8.8* 9.0 8.7*   GFR Estimated Creatinine Clearance: 66.4 mL/min (A) (by C-G formula based on SCr of 1.33 mg/dL (H)). Liver Function Tests: No results for input(s): AST, ALT, ALKPHOS, BILITOT, PROT, ALBUMIN in the last 168 hours. No results for input(s): LIPASE, AMYLASE in the last 168 hours. No results for  input(s): AMMONIA in the last 168 hours. Coagulation profile Recent Labs  Lab 11/19/20 0457  INR 2.1*   COVID-19 Labs  No results for input(s): DDIMER, FERRITIN, LDH, CRP in the last 72 hours.  Lab Results  Component Value Date   SARSCOV2NAA POSITIVE (A) 10/30/2020   SARSCOV2NAA NEGATIVE 07/07/2020   [place] NEGATIVE 12/23/2019    CBC: Recent Labs  Lab 11/19/20 0457  WBC 11.7*  HGB 9.7*  HCT 31.7*  MCV 102.3*  PLT 221   Cardiac Enzymes: No results for input(s): CKTOTAL, CKMB, CKMBINDEX, TROPONINI in the last 168 hours. BNP (last 3 results) No results for input(s): PROBNP in the last 8760 hours. CBG: Recent Labs  Lab 11/24/20 0756 11/24/20 1124 11/24/20 1654 11/24/20 1944 11/25/20 0653  GLUCAP 93 99 278* 212* 124*   D-Dimer: No results for input(s): DDIMER in the last 72 hours. Hgb A1c: No results for input(s): HGBA1C in the last 72 hours. Lipid Profile: No results for input(s): CHOL, HDL, LDLCALC,  TRIG, CHOLHDL, LDLDIRECT in the last 72 hours. Thyroid function studies: No results for input(s): TSH, T4TOTAL, T3FREE, THYROIDAB in the last 72 hours.  Invalid input(s): FREET3 Anemia work up: No results for input(s): VITAMINB12, FOLATE, FERRITIN, TIBC, IRON, RETICCTPCT in the last 72 hours. Sepsis Labs: Recent Labs  Lab 11/19/20 0457  WBC 11.7*   Microbiology No results found for this or any previous visit (from the past 240 hour(s)).   Medications:   . atorvastatin  20 mg Oral Daily  . Chlorhexidine Gluconate Cloth  6 each Topical Daily  . cholecalciferol  2,000 Units Oral BID  . dexamethasone  2 mg Oral Daily  . feeding supplement (GLUCERNA SHAKE)  237 mL Oral BID BM  . furosemide  20 mg Oral Daily  . insulin aspart  0-15 Units Subcutaneous TID WC  . insulin aspart  0-5 Units Subcutaneous QHS  . insulin aspart  2 Units Subcutaneous TID WC  . insulin glargine  20 Units Subcutaneous BID  . midodrine  10 mg Oral TID WC  . multivitamin with  minerals  1 tablet Oral Daily  . polyethylene glycol  17 g Oral BID  . potassium chloride  10 mEq Oral Daily  . senna-docusate  1 tablet Oral BID  . sodium chloride flush  10-40 mL Intracatheter Q12H  . sodium chloride  1 g Oral q AM  . tamsulosin  0.4 mg Oral Daily  . warfarin  4 mg Oral q1600  . Warfarin - Pharmacist Dosing Inpatient   Does not apply q1600   Continuous Infusions:    LOS: 26 days   Charlynne Cousins  [facility]  11/25/2020, 9:39 AM

## 2020-11-26 DIAGNOSIS — C7931 Secondary malignant neoplasm of brain: Secondary | ICD-10-CM | POA: Diagnosis not present

## 2020-11-26 DIAGNOSIS — S72402A Unspecified fracture of lower end of left femur, initial encounter for closed fracture: Secondary | ICD-10-CM | POA: Diagnosis not present

## 2020-11-26 LAB — CBC
HCT: 34.3 % — ABNORMAL LOW (ref 39.0–52.0)
Hemoglobin: 10.6 g/dL — ABNORMAL LOW (ref 13.0–17.0)
MCH: 31.5 pg (ref 26.0–34.0)
MCHC: 30.9 g/dL (ref 30.0–36.0)
MCV: 101.8 fL — ABNORMAL HIGH (ref 80.0–100.0)
Platelets: 210 10*3/uL (ref 150–400)
RBC: 3.37 MIL/uL — ABNORMAL LOW (ref 4.22–5.81)
RDW: 16.9 % — ABNORMAL HIGH (ref 11.5–15.5)
WBC: 9 10*3/uL (ref 4.0–10.5)
nRBC: 0.7 % — ABNORMAL HIGH (ref 0.0–0.2)

## 2020-11-26 LAB — GLUCOSE, CAPILLARY
Glucose-Capillary: 182 mg/dL — ABNORMAL HIGH (ref 70–99)
Glucose-Capillary: 109 mg/dL — ABNORMAL HIGH (ref 70–99)
Glucose-Capillary: 128 mg/dL — ABNORMAL HIGH (ref 70–99)
Glucose-Capillary: 143 mg/dL — ABNORMAL HIGH (ref 70–99)

## 2020-11-26 LAB — PROTIME-INR
INR: 2 — ABNORMAL HIGH (ref 0.8–1.2)
Prothrombin Time: 22.4 seconds — ABNORMAL HIGH (ref 11.4–15.2)

## 2020-11-26 NOTE — Progress Notes (Signed)
[facility] PROGRESS NOTE    Progress Note  Link Neila Gear.  BPZ:[id number] DOB: November 03, 1960 DOA: 10/30/2020 PCP: [name], MD     Brief Narrative:   Dennis Bossier. is an 60 y.o. 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 diagnosed on 12/31/2019, diabetes mellitus type 2 remote history of alcohol abuse presents with a fall that led to a left distal femur fracture, was found to be incidentally Covid positive on the right surgical repair  Awaiting skilled nursing facility placement  Significant Events: 10/31/2020 intramedullary femoral nailing  Significant studies: 10/30/2020 displaced and angulated spiral fracture of the distal femur  Antibiotics: None  Microbiology data: Blood culture:  Procedures: 11/13/2018 post intramedullary rod fixation of the distal left femur fracture  Assessment/Plan:    Femoral distal fracture [facility]): Patient is stable for discharge. Patient is homeless. Physical and Occupational Therapy continue to evaluate him and recommending skilled nursing facility.  Incidental COVID-19 infection: He received 3 days of prophylactic IV remdesivir. Now back on his home dose of Decadron.  Acute respiratory failure with hypoxia: Initially there was a concern for volume overload he was given IV Lasix and this resolved.  Chronic systolic heart failure: With an EF of 25 to 30%, continue current medication. Blood pressure stable continue Lasix, continue potassium supplementation.  Normocytic anemia/anemia of chronic disease: Hemoglobin is stable continue iron as an outpatient.  Constipation: Resolved with MiraLAX.  Hypotension: Continue midodrine.  Diabetes mellitus type 2 per glycemia: Decrease long-acting insulin continue sliding scale he had an episode of hypoglycemia this morning.  Acute kidney injury With a baseline  creatinine of 0.9, on admission 1.5 improved with IV Lasix.  History of stroke: Continue Coumadin.  Small cell metastatic brain cancer: Continue whole brain radiation and chemotherapy as an outpatient continue dexamethasone.  DVT prophylaxis: lovenxo Family Communication:none Status is: Inpatient  Remains inpatient appropriate because:Hemodynamically unstable   Dispo:  Patient From:    Planned Disposition:    Expected discharge date: 11/30/2020  Medically stable for discharge:         Code Status:     Code Status Orders  (From admission, onward)         Start     Ordered   10/30/20 1530  Full code  Continuous        10/30/20 1538        Code Status History    Date Active Date Inactive Code Status Order ID Comments User Context   07/08/2020 0127 07/25/2020 2346 Full Code [id number]  Vernelle Emerald, MD ED   12/23/2019 1438 12/28/2019 2357 Full Code [id number]  Richarda Osmond, DO Inpatient   09/04/2018 2356 09/05/2018 1650 Full Code [id number]  Phillips Grout, MD ED   08/17/2015 2244 08/23/2015 2157 Full Code [id number]  Harriet Butte, NP Inpatient   08/17/2015 0032 08/17/2015 2244 Full Code [id number]  Rebeca Allegra ED   07/16/2015 1846 07/17/2015 1836 Full Code [id number]  Davonna Belling, MD ED   02/07/2015 2004 02/14/2015 1629 Full Code [id number]  Bary Leriche, PA-C Inpatient   02/07/2015 1030 02/07/2015 2004 Full Code [id number]  Jolaine Artist, MD Inpatient   02/01/2015 1353 02/07/2015 1030 Full Code [id number]  Hosie Poisson, MD Inpatient   01/26/2015 1821 02/01/2015 1353 Full Code [id number]  Flora Lipps Inpatient   01/24/2015 1914  01/26/2015 1821 Full Code [id number]  Annita Brod, MD Inpatient   09/21/2014 1634 09/25/2014 1640 Full Code [id number]  Conrad [place], NP Inpatient   05/30/2014 1445 06/01/2014 1725 Full Code [id number]  Jolaine Artist, MD Inpatient   05/26/2014 1901 05/30/2014 1445 Full Code [id number]  Blain Pais, MD  Inpatient   08/09/2013 1426 08/10/2013 1705 Full Code [id number]  Ivor Costa, MD Inpatient   Advance Care Planning Activity        IV Access:    Peripheral IV   Procedures and diagnostic studies:   No results found.   Medical Consultants:    None.   Subjective:    Dennis Rosner. has no new complaints  Objective:    Vitals:   11/25/20 0900 11/25/20 1641 11/26/20 0415 11/26/20 0751  BP: 108/60 (!) 117/93 112/81 112/89  Pulse: 97 (!) 102 97 97  Resp:  18  18  Temp: 98.3 F (36.8 C) 98 F (36.7 C) 98.3 F (36.8 C) 97.6 F (36.4 C)  TempSrc: Oral Oral Oral Oral  SpO2: 97% 99% 98% 100%  Weight:   85.9 kg   Height:       SpO2: 100 % O2 Flow Rate (L/min): 3 L/min   Intake/Output Summary (Last 24 hours) at 11/26/2020 1012 Last data filed at 11/26/2020 0626 Gross per 24 hour  Intake 480 ml  Output 1500 ml  Net -1020 ml   Filed Weights   11/24/20 0428 11/25/20 0500 11/26/20 0415  Weight: 88.6 kg 85.8 kg 85.9 kg    Exam: General exam: In no acute distress. Respiratory system: Good air movement and clear to auscultation. Cardiovascular system: S1 & S2 heard, RRR. No JVD. Gastrointestinal system: Abdomen is nondistended, soft and nontender.  Extremities: No pedal edema. Skin: No rashes, lesions or ulcers Psychiatry: Judgement and insight appear normal. Mood & affect appropriate.  Data Reviewed:    Labs: Basic Metabolic Panel: Recent Labs  Lab 11/23/20 0306 11/24/20 0323 11/25/20 0354  NA 138 141 139  K 3.8 3.9 3.8  CL 105 106 105  CO2 25 26 27   GLUCOSE 156* 87 177*  BUN 27* 30* 35*  CREATININE 1.23 1.30* 1.33*  CALCIUM 8.8* 9.0 8.7*   GFR Estimated Creatinine Clearance: 66.4 mL/min (A) (by C-G formula based on SCr of 1.33 mg/dL (H)). Liver Function Tests: No results for input(s): AST, ALT, ALKPHOS, BILITOT, PROT, ALBUMIN in the last 168 hours. No results for input(s): LIPASE, AMYLASE in the last 168 hours. No results for input(s):  AMMONIA in the last 168 hours. Coagulation profile Recent Labs  Lab 11/26/20 0406  INR 2.0*   COVID-19 Labs  No results for input(s): DDIMER, FERRITIN, LDH, CRP in the last 72 hours.  Lab Results  Component Value Date   SARSCOV2NAA POSITIVE (A) 10/30/2020   SARSCOV2NAA NEGATIVE 07/07/2020   [place] NEGATIVE 12/23/2019    CBC: Recent Labs  Lab 11/26/20 0406  WBC 9.0  HGB 10.6*  HCT 34.3*  MCV 101.8*  PLT 210   Cardiac Enzymes: No results for input(s): CKTOTAL, CKMB, CKMBINDEX, TROPONINI in the last 168 hours. BNP (last 3 results) No results for input(s): PROBNP in the last 8760 hours. CBG: Recent Labs  Lab 11/25/20 0653 11/25/20 1205 11/25/20 1640 11/25/20 2006 11/26/20 0842  GLUCAP 124* 94 122* 184* 182*   D-Dimer: No results for input(s): DDIMER in the last 72 hours. Hgb A1c: No results for input(s): HGBA1C in the last 72 hours. Lipid Profile: No results  for input(s): CHOL, HDL, LDLCALC, TRIG, CHOLHDL, LDLDIRECT in the last 72 hours. Thyroid function studies: No results for input(s): TSH, T4TOTAL, T3FREE, THYROIDAB in the last 72 hours.  Invalid input(s): FREET3 Anemia work up: No results for input(s): VITAMINB12, FOLATE, FERRITIN, TIBC, IRON, RETICCTPCT in the last 72 hours. Sepsis Labs: Recent Labs  Lab 11/26/20 0406  WBC 9.0   Microbiology No results found for this or any previous visit (from the past 240 hour(s)).   Medications:   . atorvastatin  20 mg Oral Daily  . Chlorhexidine Gluconate Cloth  6 each Topical Daily  . cholecalciferol  2,000 Units Oral BID  . dexamethasone  2 mg Oral Daily  . feeding supplement (GLUCERNA SHAKE)  237 mL Oral BID BM  . furosemide  20 mg Oral Daily  . insulin aspart  0-15 Units Subcutaneous TID WC  . insulin aspart  0-5 Units Subcutaneous QHS  . insulin aspart  2 Units Subcutaneous TID WC  . insulin glargine  20 Units Subcutaneous BID  . midodrine  10 mg Oral TID WC  . multivitamin with minerals  1  tablet Oral Daily  . polyethylene glycol  17 g Oral BID  . potassium chloride  10 mEq Oral Daily  . senna-docusate  1 tablet Oral BID  . sodium chloride flush  10-40 mL Intracatheter Q12H  . sodium chloride  1 g Oral q AM  . tamsulosin  0.4 mg Oral Daily  . warfarin  4 mg Oral q1600  . Warfarin - Pharmacist Dosing Inpatient   Does not apply q1600   Continuous Infusions:    LOS: 27 days   Charlynne Cousins  [facility]  11/26/2020, 10:12 AM

## 2020-11-27 ENCOUNTER — Telehealth: Payer: Self-pay

## 2020-11-27 DIAGNOSIS — S72402A Unspecified fracture of lower end of left femur, initial encounter for closed fracture: Secondary | ICD-10-CM | POA: Diagnosis not present

## 2020-11-27 DIAGNOSIS — C7931 Secondary malignant neoplasm of brain: Secondary | ICD-10-CM | POA: Diagnosis not present

## 2020-11-27 LAB — GLUCOSE, CAPILLARY
Glucose-Capillary: 75 mg/dL (ref 70–99)
Glucose-Capillary: 132 mg/dL — ABNORMAL HIGH (ref 70–99)
Glucose-Capillary: 168 mg/dL — ABNORMAL HIGH (ref 70–99)
Glucose-Capillary: 245 mg/dL — ABNORMAL HIGH (ref 70–99)

## 2020-11-27 NOTE — Progress Notes (Signed)
Occupational Therapy Treatment Patient Details Name: Dennis [name]. MRN: [id number] DOB: [birth year] Today's Date: 11/27/2020    History of present illness Patient is a 60 y/o male with PMH significant for IDDM, HTN, HLD, CVA in 2016, lung cancer, and CAD on warfarin. Patient had mechanical fall resulting in L femur fx. Patient s/p IM nail L femur on 1/19. Covid+ on precautions as of 10/31/19; off prec as of 1/31   OT comments  Pt continues to make good progress with functional goals. OT will continue to follow acutely to maximize level of function and safety  Follow Up Recommendations  HH vs SNF    Equipment Recommendations  Other (comment);3 in 1 bedside commode (RW, TBD)    Recommendations for Other Services      Precautions / Restrictions Precautions Precautions: Fall Restrictions Weight Bearing Restrictions: Yes LLE Weight Bearing: Weight bearing as tolerated       Mobility Bed Mobility               General bed mobility comments: pt in chair upon arrival  Transfers Overall transfer level: Needs assistance Equipment used: Rolling walker (2 wheeled) Transfers: Sit to/from Stand Sit to Stand: Min guard Stand pivot transfers: Min assist       General transfer comment: cues for hand placement, no physical assist to power up but increased time to complete standing    Balance Overall balance assessment: Needs assistance Sitting-balance support: Feet supported;No upper extremity supported Sitting balance-Leahy Scale: Fair     Standing balance support: Bilateral upper extremity supported;During functional activity Standing balance-Leahy Scale: Poor Standing balance comment: performed posterior  hygiene and UB dressing standing at RW                           ADL either performed or assessed with clinical judgement   ADL Overall ADL's : Needs assistance/impaired     Grooming: Wash/dry hands;Wash/dry face;Min guard;Standing           Upper  Body Dressing : Min guard;Standing       Toilet Transfer: RW;Stand-pivot;Cueing for safety;Cueing for sequencing;Comfort height toilet;Grab bars;Ambulation;Min guard   Toileting- Clothing Manipulation and Hygiene: Sit to/from stand;Min guard       Functional mobility during ADLs: Rolling walker;Cueing for sequencing;Cueing for safety;Min guard General ADL Comments: performed posterior  hygiene and UB dressing standing at [employer] Baseline Vision/History: No visual deficits Patient Visual Report: No change from baseline     Perception     Praxis      Cognition Arousal/Alertness: Awake/alert Behavior During Therapy: WFL for tasks assessed/performed Overall Cognitive Status: Within Functional Limits for tasks assessed                                 General Comments: pads in chair soiled in urine and BM and pt seemed unaware        Exercises     Shoulder Instructions       General Comments      Pertinent Vitals/ Pain       Pain Assessment: Faces Pain Score: 2  Pain Location: L LE during mobility Pain Descriptors / Indicators: Grimacing;Guarding Pain Intervention(s): Monitored during session;Repositioned  Home Living  Prior Functioning/Environment              Frequency  Min 2X/week        Progress Toward Goals  OT Goals(current goals can now be found in the care plan section)  Progress towards OT goals: Progressing toward goals  Acute Rehab OT Goals Patient Stated Goal: to leave hospital  Plan Discharge plan remains appropriate    Co-evaluation                 AM-PAC OT "6 Clicks" Daily Activity     Outcome Measure   Help from another person eating meals?: None Help from another person taking care of personal grooming?: A Little Help from another person toileting, which includes using toliet, bedpan, or urinal?: A Little Help from another person bathing  (including washing, rinsing, drying)?: A Lot Help from another person to put on and taking off regular upper body clothing?: A Little Help from another person to put on and taking off regular lower body clothing?: A Lot 6 Click Score: 17    End of Session Equipment Utilized During Treatment: Gait belt;Rolling walker;Other (comment) (3 in 1 over toilet)  OT Visit Diagnosis: Unsteadiness on feet (R26.81);Other abnormalities of gait and mobility (R26.89);History of falling (Z91.81);Pain Pain - Right/Left: Left Pain - part of body: Leg   Activity Tolerance Patient tolerated treatment well   Patient Left in chair;with call bell/phone within reach;with chair alarm set   Nurse Communication          Time: 7416-3845 OT Time Calculation (min): 26 min  Charges: OT General Charges $OT Visit: 1 Visit OT Treatments $Self Care/Home Management : 8-22 mins $Therapeutic Activity: 8-22 mins     Britt Bottom 11/27/2020, 2:43 PM

## 2020-11-27 NOTE — Telephone Encounter (Addendum)
Schedule message sent to r/s pt for 3rd or 4th week of March. ----- Message from Ventura Sellers, MD sent at 11/26/2020  4:32 PM EST ----- Regarding: RE: pt had scan as an IP, OP visit with Dr. Mickeal Skinner cancelled Can reschedule for when he is discharged, yes. 3-4 weeks is ok. ----- Message ----- From: Claretha Cooper, CMA Sent: 11/26/2020   4:30 PM EST To: Ventura Sellers, MD Subject: FW: pt had scan as an IP, OP visit with Dr. Kermit Balo Afternoon,  This pt is still inpatient. Do you see pts while they are hospitalized?   If not, would you prefer the pt be r/s out 3-4 weeks to hopefully allow for the pt to be discharged within that time? (In this case, I know mobility will be an issue considering the type of fx he suffered).  Thank you  ----- Message ----- From: Patton Salles, RN Sent: 11/26/2020   9:07 AM EST To: Pincus Large, Wilmon Arms, RN, # Subject: RE: pt had scan as an IP, OP visit with Dr. Manuela Schwartz, Maudie Mercury has been working in [employer] and I float.   I am forwarding to Dr Mickeal Skinner and his nurse today.  Thanks, Tammi ----- Message ----- From: Pincus Large Sent: 11/23/2020   4:45 PM EST To: Patton Salles, RN, Wilmon Arms, RN Subject: pt had scan as an IP, OP visit with Dr. Sharolyn Douglas  This patient had his brain scan done as an IP, but the OP visit with Dr. Mickeal Skinner was cancelled and nothing rescheduled. I am not sure if he was planning to see this patient during his hospital admission.    Manuela Schwartz

## 2020-11-27 NOTE — Progress Notes (Signed)
ANTICOAGULATION CONSULT NOTE  Pharmacy Consult:  Coumadin Indication: CVA  No Known Allergies  Patient Measurements: Height: 6' (182.9 cm) Weight: 85.9 kg (189 lb 6 oz) IBW/kg (Calculated) : 77.6 Heparin Dosing Weight: 85.3 kg  Vital Signs: Temp: 98.2 F (36.8 C) (02/15 0803) Temp Source: Oral (02/15 0400) BP: 110/97 (02/15 0803) Pulse Rate: 100 (02/15 0803)  Labs: Recent Labs    11/25/20 0354 11/26/20 0406  HGB  --  10.6*  HCT  --  34.3*  PLT  --  210  LABPROT  --  22.4*  INR  --  2.0*  CREATININE 1.33*  --     Estimated Creatinine Clearance: 66.4 mL/min (A) (by C-G formula based on SCr of 1.33 mg/dL (H)).   Assessment: 39 YOM on Coumadin PTA for hx CVA. Admitted with INR 1.9 on PTA dose of 3.75 mg daily EXCEPT for 7.5 mg on Tues (+/- Thurs per [facility] note).  Held and reversed for repair of femur fracture with ortho on 10/31/20. Coumadin resumed on 11/01/20 without a bridge.  INR therapeutic at 2 on 11/26/20. No reported bleeding.  Goal of Therapy:  INR 2-3 Monitor platelets by anticoagulation protocol: Yes   Plan:  Continue scheduled warfarin 4mg  PO daily  Monitor qMon INR, weekly CBC/plt Monitor for signs/symptoms of bleeding   Jacquelyn Antony D. Mina Marble, PharmD, BCPS, [place] 11/27/2020, 1:01 PM

## 2020-11-27 NOTE — Progress Notes (Signed)
[facility] PROGRESS NOTE    Progress Note  Dennis [name].  WCB:[id number] DOB: 16-Nov-1960 DOA: 10/30/2020 PCP: [name], MD     Brief Narrative:   Dennis Ungaro. is an 60 y.o. 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 diagnosed on 12/31/2019, diabetes mellitus type 2 remote history of alcohol abuse presents with a fall that led to a left distal femur fracture, was found to be incidentally Covid positive on the right surgical repair  Awaiting skilled nursing facility placement  Significant Events: 10/31/2020 intramedullary femoral nailing  Significant studies: 10/30/2020 displaced and angulated spiral fracture of the distal femur  Antibiotics: None  Microbiology data: Blood culture:  Procedures: 11/13/2018 post intramedullary rod fixation of the distal left femur fracture  Assessment/Plan:    Femoral distal fracture [facility]): Patient is stable for discharge. Patient is homeless. Physical and Occupational Therapy continue to evaluate him and recommending skilled nursing facility.  Incidental COVID-19 infection: He received 3 days of prophylactic IV remdesivir. Now back on his home dose of Decadron.  Acute respiratory failure with hypoxia: Initially there was a concern for volume overload he was given IV Lasix and this resolved.  Chronic systolic heart failure: With an EF of 25 to 30%, continue current medication. Blood pressure stable continue Lasix, continue potassium supplementation.  Normocytic anemia/anemia of chronic disease: Hemoglobin is stable continue iron as an outpatient.  Constipation: Resolved with MiraLAX.  Hypotension: Continue midodrine.  Diabetes mellitus type 2 per glycemia: Decrease long-acting insulin continue sliding scale he had an episode of hypoglycemia this morning.  Acute kidney injury With a baseline  creatinine of 0.9, on admission 1.5 improved with IV Lasix.  History of stroke: Continue Coumadin.  Small cell metastatic brain cancer: Continue whole brain radiation and chemotherapy as an outpatient continue dexamethasone.  DVT prophylaxis: lovenxo Family Communication:none Status is: Inpatient  Remains inpatient appropriate because:Hemodynamically unstable   Dispo:  Patient From:    Planned Disposition:    Expected discharge date: 11/30/2020  Medically stable for discharge:         Code Status:     Code Status Orders  (From admission, onward)         Start     Ordered   10/30/20 1530  Full code  Continuous        10/30/20 1538        Code Status History    Date Active Date Inactive Code Status Order ID Comments User Context   07/08/2020 0127 07/25/2020 2346 Full Code [id number]  Vernelle Emerald, MD ED   12/23/2019 1438 12/28/2019 2357 Full Code [id number]  Richarda Osmond, DO Inpatient   09/04/2018 2356 09/05/2018 1650 Full Code [id number]  Phillips Grout, MD ED   08/17/2015 2244 08/23/2015 2157 Full Code [id number]  Harriet [place]te, NP Inpatient   08/17/2015 0032 08/17/2015 2244 Full Code [id number]  Rebeca Allegra ED   07/16/2015 1846 07/17/2015 1836 Full Code [id number]  Davonna Belling, MD ED   02/07/2015 2004 02/14/2015 1629 Full Code [id number]  Bary Leriche, PA-C Inpatient   02/07/2015 1030 02/07/2015 2004 Full Code [id number]  Jolaine Artist, MD Inpatient   02/01/2015 1353 02/07/2015 1030 Full Code [id number]  Hosie Poisson, MD Inpatient   01/26/2015 1821 02/01/2015 1353 Full Code [id number]  Flora Lipps Inpatient   01/24/2015 1914  01/26/2015 1821 Full Code [id number]  Annita Brod, MD Inpatient   09/21/2014 1634 09/25/2014 1640 Full Code [id number]  Conrad [place], NP Inpatient   05/30/2014 1445 06/01/2014 1725 Full Code [id number]  Jolaine Artist, MD Inpatient   05/26/2014 1901 05/30/2014 1445 Full Code [id number]  Blain Pais, MD  Inpatient   08/09/2013 1426 08/10/2013 1705 Full Code [id number]  Ivor Costa, MD Inpatient   Advance Care Planning Activity        IV Access:    Peripheral IV   Procedures and diagnostic studies:   No results found.   Medical Consultants:    None.   Subjective:    Dennis Lacko. has no new complaints  Objective:    Vitals:   11/26/20 1603 11/26/20 2100 11/27/20 0400 11/27/20 0803  BP: 111/84 113/86 110/90 (!) 110/97  Pulse: (!) 104 92 98 100  Resp: 19 20 16 16   Temp: (!) 97.5 F (36.4 C) 97.7 F (36.5 C) 97.9 F (36.6 C) 98.2 F (36.8 C)  TempSrc: Oral Oral Oral   SpO2: 100% 100% 98% 93%  Weight:      Height:       SpO2: 93 % O2 Flow Rate (L/min): 3 L/min   Intake/Output Summary (Last 24 hours) at 11/27/2020 0951 Last data filed at 11/27/2020 0900 Gross per 24 hour  Intake 240 ml  Output 1150 ml  Net -910 ml   Filed Weights   11/24/20 0428 11/25/20 0500 11/26/20 0415  Weight: 88.6 kg 85.8 kg 85.9 kg    Exam: General exam: In no acute distress. Respiratory system: Good air movement and clear to auscultation. Cardiovascular system: S1 & S2 heard, RRR. No JVD. Gastrointestinal system: Abdomen is nondistended, soft and nontender.  Extremities: No pedal edema. Skin: No rashes, lesions or ulcers Psychiatry: Judgement and insight appear normal. Mood & affect appropriate.  Data Reviewed:    Labs: Basic Metabolic Panel: Recent Labs  Lab 11/23/20 0306 11/24/20 0323 11/25/20 0354  NA 138 141 139  K 3.8 3.9 3.8  CL 105 106 105  CO2 25 26 27   GLUCOSE 156* 87 177*  BUN 27* 30* 35*  CREATININE 1.23 1.30* 1.33*  CALCIUM 8.8* 9.0 8.7*   GFR Estimated Creatinine Clearance: 66.4 mL/min (A) (by C-G formula based on SCr of 1.33 mg/dL (H)). Liver Function Tests: No results for input(s): AST, ALT, ALKPHOS, BILITOT, PROT, ALBUMIN in the last 168 hours. No results for input(s): LIPASE, AMYLASE in the last 168 hours. No results for input(s): AMMONIA  in the last 168 hours. Coagulation profile Recent Labs  Lab 11/26/20 0406  INR 2.0*   COVID-19 Labs  No results for input(s): DDIMER, FERRITIN, LDH, CRP in the last 72 hours.  Lab Results  Component Value Date   SARSCOV2NAA POSITIVE (A) 10/30/2020   SARSCOV2NAA NEGATIVE 07/07/2020   [place] NEGATIVE 12/23/2019    CBC: Recent Labs  Lab 11/26/20 0406  WBC 9.0  HGB 10.6*  HCT 34.3*  MCV 101.8*  PLT 210   Cardiac Enzymes: No results for input(s): CKTOTAL, CKMB, CKMBINDEX, TROPONINI in the last 168 hours. BNP (last 3 results) No results for input(s): PROBNP in the last 8760 hours. CBG: Recent Labs  Lab 11/26/20 0842 11/26/20 1141 11/26/20 1649 11/26/20 2219 11/27/20 0707  GLUCAP 182* 109* 128* 143* 75   D-Dimer: No results for input(s): DDIMER in the last 72 hours. Hgb A1c: No results for input(s): HGBA1C in the last 72 hours. Lipid Profile: No results  for input(s): CHOL, HDL, LDLCALC, TRIG, CHOLHDL, LDLDIRECT in the last 72 hours. Thyroid function studies: No results for input(s): TSH, T4TOTAL, T3FREE, THYROIDAB in the last 72 hours.  Invalid input(s): FREET3 Anemia work up: No results for input(s): VITAMINB12, FOLATE, FERRITIN, TIBC, IRON, RETICCTPCT in the last 72 hours. Sepsis Labs: Recent Labs  Lab 11/26/20 0406  WBC 9.0   Microbiology No results found for this or any previous visit (from the past 240 hour(s)).   Medications:   . atorvastatin  20 mg Oral Daily  . Chlorhexidine Gluconate Cloth  6 each Topical Daily  . cholecalciferol  2,000 Units Oral BID  . dexamethasone  2 mg Oral Daily  . feeding supplement (GLUCERNA SHAKE)  237 mL Oral BID BM  . furosemide  20 mg Oral Daily  . insulin aspart  0-15 Units Subcutaneous TID WC  . insulin aspart  0-5 Units Subcutaneous QHS  . insulin aspart  2 Units Subcutaneous TID WC  . insulin glargine  20 Units Subcutaneous BID  . midodrine  10 mg Oral TID WC  . multivitamin with minerals  1 tablet  Oral Daily  . polyethylene glycol  17 g Oral BID  . potassium chloride  10 mEq Oral Daily  . senna-docusate  1 tablet Oral BID  . sodium chloride flush  10-40 mL Intracatheter Q12H  . tamsulosin  0.4 mg Oral Daily  . warfarin  4 mg Oral q1600  . Warfarin - Pharmacist Dosing Inpatient   Does not apply q1600   Continuous Infusions:    LOS: 28 days   Charlynne Cousins  [facility]  11/27/2020, 9:51 AM

## 2020-11-27 NOTE — Progress Notes (Signed)
Physical Therapy Treatment Patient Details Name: Dennis [name]. MRN: [id number] DOB: 14-Mar-1961 Today's Date: 11/27/2020    History of Present Illness Patient is a 60 y/o male with PMH significant for IDDM, HTN, HLD, CVA in 2016, lung cancer, and CAD on warfarin. Patient had mechanical fall resulting in L femur fx. Patient s/p IM nail L femur on 1/19. Covid+ on precautions as of 10/31/19; off prec as of 1/31    PT Comments    Continuing work on functional mobility and activity tolerance;  Showing overall good progress;  We discussed possibility of going home, and pt indicated he thinks his mother would be happy to have him at home; Appreciate TOC Team's work  Follow Up Recommendations  Home health PT;Supervision/Assistance - 24 hour     Equipment Recommendations  Rolling walker with 5" wheels;3in1 (PT)    Recommendations for Other Services       Precautions / Restrictions Precautions Precautions: Fall Restrictions Weight Bearing Restrictions: Yes LLE Weight Bearing: Weight bearing as tolerated    Mobility  Bed Mobility Overal bed mobility: Needs Assistance Bed Mobility: Supine to Sit     Supine to sit: Min guard (no physical contact)     General bed mobility comments: Slow moving and used rails    Transfers Overall transfer level: Needs assistance Equipment used: Rolling walker (2 wheeled) Transfers: Sit to/from Stand Sit to Stand: Min guard;Min assist Stand pivot transfers: Min assist       General transfer comment: cues for hand placement, no physical assist to power up from bed, but increased time to complete standing; Tried getting up and down from low commode, and pt needed min assist for liftoff  Ambulation/Gait Ambulation/Gait assistance: Min guard Gait Distance (Feet): 65 Feet Assistive device: Rolling walker (2 wheeled) Gait Pattern/deviations: Step-through pattern;Antalgic     General Gait Details: cues for posture and heel strike with LLE, pt able  to manage RW. cues for self-monitoring energy and need to rest; pt requested chair to be pulled up to him appropriately   Stairs             Wheelchair Mobility    Modified Rankin (Stroke Patients Only)       Balance Overall balance assessment: Needs assistance Sitting-balance support: Feet supported;No upper extremity supported Sitting balance-Leahy Scale: Fair     Standing balance support: Bilateral upper extremity supported;During functional activity Standing balance-Leahy Scale: Poor Standing balance comment: performed posterior  hygiene and UB dressing standing at [employer] Arousal/Alertness: Awake/alert Behavior During Therapy: WFL for tasks assessed/performed Overall Cognitive Status: Within Functional Limits for tasks assessed                                 General Comments: pads in chair soiled in urine and BM and pt seemed unaware      Exercises      General Comments        Pertinent Vitals/Pain Pain Assessment: Faces Pain Score: 2  Faces Pain Scale: Hurts a little bit Pain Location: L LE during mobility Pain Descriptors / Indicators: Grimacing;Guarding Pain Intervention(s): Monitored during session    Home Living                      Prior Function  PT Goals (current goals can now be found in the care plan section) Acute Rehab PT Goals Patient Stated Goal: to leave hospital PT Goal Formulation: With patient Time For Goal Achievement: 12/03/20 Potential to Achieve Goals: Good Progress towards PT goals: Progressing toward goals    Frequency    Min 4X/week      PT Plan Discharge plan needs to be updated;Frequency needs to be updated    Co-evaluation              AM-PAC PT "6 Clicks" Mobility   Outcome Measure  Help needed turning from your back to your side while in a flat bed without using bedrails?: None Help needed moving from lying on your  back to sitting on the side of a flat bed without using bedrails?: A Little Help needed moving to and from a bed to a chair (including a wheelchair)?: A Little Help needed standing up from a chair using your arms (e.g., wheelchair or bedside chair)?: A Little Help needed to walk in hospital room?: A Little Help needed climbing 3-5 steps with a railing? : A Little 6 Click Score: 19    End of Session Equipment Utilized During Treatment: Gait belt Activity Tolerance: Patient tolerated treatment well Patient left: in chair;with call bell/phone within reach;with chair alarm set Nurse Communication: Mobility status PT Visit Diagnosis: Unsteadiness on feet (R26.81);Other abnormalities of gait and mobility (R26.89);Muscle weakness (generalized) (M62.81);History of falling (Z91.81)     Time: 3818-2993 PT Time Calculation (min) (ACUTE ONLY): 23 min  Charges:  $Gait Training: 23-37 mins                     Dennis [name], [place]  Acute Rehabilitation Services Pager [phone] Office [place] 11/27/2020, 3:45 PM

## 2020-11-27 NOTE — Plan of Care (Signed)

## 2020-11-28 DIAGNOSIS — S72402A Unspecified fracture of lower end of left femur, initial encounter for closed fracture: Secondary | ICD-10-CM | POA: Diagnosis not present

## 2020-11-28 DIAGNOSIS — Z95810 Presence of automatic (implantable) cardiac defibrillator: Secondary | ICD-10-CM | POA: Diagnosis not present

## 2020-11-28 DIAGNOSIS — I5022 Chronic systolic (congestive) heart failure: Secondary | ICD-10-CM | POA: Diagnosis not present

## 2020-11-28 DIAGNOSIS — C7931 Secondary malignant neoplasm of brain: Secondary | ICD-10-CM | POA: Diagnosis not present

## 2020-11-28 LAB — GLUCOSE, CAPILLARY
Glucose-Capillary: 166 mg/dL — ABNORMAL HIGH (ref 70–99)
Glucose-Capillary: 134 mg/dL — ABNORMAL HIGH (ref 70–99)

## 2020-11-28 MED ORDER — HYDROCODONE-ACETAMINOPHEN 5-325 MG PO TABS
1.0000 | ORAL_TABLET | Freq: Four times a day (QID) | ORAL | 0 refills | Status: DC | PRN
Start: 2020-11-28 — End: 2020-12-29

## 2020-11-28 MED ORDER — POTASSIUM CHLORIDE CRYS ER 10 MEQ PO TBCR: 10.0000 meq | EXTENDED_RELEASE_TABLET | Freq: Every day | ORAL | 0 refills | Status: DC

## 2020-11-28 MED ORDER — FUROSEMIDE 20 MG PO TABS: 20.0000 mg | ORAL_TABLET | Freq: Every day | ORAL | 0 refills | Status: DC

## 2020-11-28 MED ORDER — HEPARIN SOD (PORK) LOCK FLUSH 100 UNIT/ML IV SOLN
500.0000 [IU] | INTRAVENOUS | Status: AC | PRN
  Administered 2020-11-28: 500 [IU]
  Filled 2020-11-28: qty 5

## 2020-11-28 NOTE — Discharge Summary (Signed)
Physician Discharge Summary  Dennis [name]. ZWC:[id number] DOB: [birth year] DOA: 10/30/2020  PCP: [name], MD  Admit date: 10/30/2020 Discharge date: 11/28/2020  Admitted From: Home Disposition: Home with home health  Recommendations for Outpatient Follow-up:  1. Follow up with PCP in 1-2 weeks 2. Please obtain BMP/CBC in one week 3. Follow-up as is scheduled for orthopedic surgery.  Follow-up with oncology for further cancer treatment plan.  You already have a scheduled follow-up with cardiology.  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  He was also found to have Covid positivity.  He stayed in the hospital since last 1 month, recovering and waiting to go to a skilled nursing facility and now graduated enough to go home with home health.  Distal left femoral fracture: Closed traumatic fracture. Status post ORIF on 1/19 Weightbearing as tolerated Pain adequately managed, occasionally using oxycodone, will prescribe DVT prophylaxis with Coumadin, therapeutic. Initially recommended a skilled nursing facility, patient did very well, has adequate support at home with his mother and brother so going home.  Will prescribe outpatient therapies.  Patient will also benefit with rolling walker and 3 in 1 to improve his mobility, safety.  COVID-19 infection/acute respiratory failure with hypoxia: Adequately improved.  Off isolation.  On maintenance  steroids.  Completed therapeutics.  Chronic systolic congestive heart failure/AICD in situ/chronic anticoagulation: Medically optimized.  Therapeutic on Coumadin.  Will have outpatient cardiology follow-up.  Small cell lung cancer with metastatic brain lesion: Followed by Dr. Mickeal Skinner and [name].  Currently on maintenance steroid.  Oncology will follow and make further plans.  Acute on chronic anemia of chronic disease: Hemoglobin is stable.  Type 2 diabetes, uncontrolled with hyperglycemia: Resume home dose of insulin.  Well-controlled on current doses.  Cocaine use: Counseled.  Patient is medically stable.  He is with multiple medical issues and severe medical conditions and high risk of decompensation.  Has adequate support at home and is eager to go home.  Discharged home.   Discharge Diagnoses:  Principal Problem:   Femoral distal fracture ([place]) Active Problems:   Automatic implantable cardioverter-defibrillator in situ   Chronic systolic heart failure (HCC)   Cocaine abuse ([place])   Dysphagia   History of CVA with residual deficit   Uncontrolled type 2 diabetes mellitus with hyperglycemia, with long-term current use of insulin (HCC)   Orthostatic hypotension   Brain metastases ([place])   Fall   COVID-19 virus infection    Discharge Instructions  Discharge Instructions    Ambulatory referral to Physical Therapy   Complete by: As directed    Call MD for:  difficulty breathing, headache or visual disturbances   Complete by: As directed    Call MD for:  extreme fatigue   Complete by: As directed    Call MD for:  persistant dizziness or light-headedness   Complete by: As directed    Call MD for:  persistant nausea and vomiting   Complete by: As directed    Call MD for:  severe uncontrolled  pain   Complete by: As directed    Call MD for:  temperature >100.4   Complete by: As directed    Diet - low sodium heart healthy   Complete by: As directed    Discharge  instructions   Complete by: As directed    Please review instructions on the discharge summary.  You were cared for by a hospitalist during your hospital stay. If you have any questions about your discharge medications or the care you received while you were in the hospital after you are discharged, you can call the unit and asked to speak with the hospitalist on call if the hospitalist that took care of you is not available. Once you are discharged, your primary care physician will handle any further medical issues. Please note that NO REFILLS for any discharge medications will be authorized once you are discharged, as it is imperative that you return to your primary care physician (or establish a relationship with a primary care physician if you do not have one) for your aftercare needs so that they can reassess your need for medications and monitor your lab values. If you do not have a primary care physician, you can call [phone] for a physician referral.   Increase activity slowly   Complete by: As directed    Increase activity slowly   Complete by: As directed    No wound care   Complete by: As directed    No wound care   Complete by: As directed      Allergies as of 11/28/2020   No Known Allergies     Medication List    TAKE these medications   Accu-Chek Softclix Lancets lancets   atorvastatin 20 MG tablet Commonly known as: LIPITOR TAKE 1 TABLET BY MOUTH DAILY AT 6 PM.   dexamethasone 2 MG tablet Commonly known as: DECADRON Take 1 tablet (2 mg total) by mouth daily.   feeding supplement Liqd Take 237 mLs by mouth 2 (two) times daily between meals.   Fifty50 Glucose Meter 2.0 w/Device Kit Use as instructed   furosemide 20 MG tablet Commonly known as: LASIX Take 1 tablet (20 mg total) by mouth daily.   HYDROcodone-acetaminophen 5-325 MG tablet Commonly known as: NORCO/VICODIN Take 1 tablet by mouth every 6 (six) hours as needed for severe pain.   insulin lispro 100  UNIT/ML KwikPen Commonly known as: HumaLOG KwikPen Inject 6 Units into the skin 3 (three) times daily. What changed: when to take this   Insupen Ultrafin 31G X 8 MM Misc Generic drug: Insulin Pen Needle   Lantus SoloStar 100 UNIT/ML Solostar Pen Generic drug: insulin glargine Inject 25 Units into the skin 2 (two) times daily. What changed: when to take this   lidocaine-prilocaine cream Commonly known as: EMLA Apply 1 application topically as needed. What changed: reasons to take this   methocarbamol 500 MG tablet Commonly known as: ROBAXIN Take 1 tablet (500 mg total) by mouth every 6 (six) hours as needed for muscle spasms.   midodrine 10 MG tablet Commonly known as: PROAMATINE Take 1 tablet (10 mg total) by mouth 3 (three) times daily with meals.   multivitamin with minerals Tabs tablet Take 1 tablet by mouth daily.   potassium chloride 10 MEQ tablet Commonly known as: KLOR-CON Take 1 tablet (10 mEq total) by mouth daily.   prochlorperazine 10 MG tablet Commonly known as: COMPAZINE Take 10 mg by mouth every 6 (six) hours as needed for nausea or vomiting.  senna 8.6 MG Tabs tablet Commonly known as: SENOKOT Take 1 tablet (8.6 mg total) by mouth 2 (two) times daily.   sennosides-docusate sodium 8.6-50 MG tablet Commonly known as: SENOKOT-S Take 1 tablet by mouth daily as needed for constipation.   sodium chloride 1 g tablet Take 1 tablet (1 g total) by mouth in the morning.   tamsulosin 0.4 MG Caps capsule Commonly known as: FLOMAX Take 0.4 mg by mouth daily.   Vitamin D3 25 MCG tablet Commonly known as: Vitamin D Take 2 tablets (2,000 Units total) by mouth 2 (two) times daily.   warfarin 7.5 MG tablet Commonly known as: COUMADIN Take as directed. If you are unsure how to take this medication, talk to your nurse or doctor. Original instructions: TAKE 1 TABLET BY MOUTH DAILY AT 6PM. What changed:   how much to take  how to take this  when to take  this  additional instructions            Durable Medical Equipment  (From admission, onward)         Start     Ordered   11/28/20 0946  DME Gilford Rile  Once       Question Answer Comment  Walker: With 5 Inch Wheels   Patient needs a walker to treat with the following condition Femoral fracture ([place])      11/28/20 0946   11/28/20 0946  DME 3-in-1  Once        11/28/20 0946          Follow-up Information    Bensimhon, Shaune Pascal, MD Follow up on 11/08/2020.   Specialty: Cardiology Why: at 10:20 AM   Contact information: [address] [place] [address] [phone]        Shona Needles, MD. Schedule an appointment as soon as possible for a visit in 2 week(s).   Specialty: Orthopedic Surgery Contact information: [place] [address] [phone]        [facility] Follow up.   Specialty: Rehabilitation Why: Referral for home health PT made, office will call and setup appointment time Contact information: [address] [id number] [place] [phone]             No Known Allergies  Consultations:  Cardiology  Oncology  Orthopedics   Procedures/Studies: MR BRAIN W WO CONTRAST  Result Date: 11/14/2020 CLINICAL DATA:  Brain mass or lesion.  Lung cancer. EXAM: MRI HEAD WITHOUT AND WITH CONTRAST TECHNIQUE: Multiplanar, multiecho pulse sequences of the brain and surrounding structures were obtained without and with intravenous contrast. CONTRAST:  8.66m GADAVIST GADOBUTROL 1 MMOL/ML IV SOLN COMPARISON:  MRI August 24, 2020. FINDINGS: Brain: The previously described enhancing lesion in the high right frontal lobe is decreased in size and now 4 mm (see series 22, image 128 and series 21, image 16). A nearby, slightly more superior and anterior punctate enhancing lesion on the axial series in the high right frontal lobe (series 22, image 135) and a  punctate enhancing lesion on the sagittal at the left frontoparietal junction (series 21, image 18) are not corroborated on other sequences. No other new enhancing lesions identified. The previously described plaque-like enhancement along the pons is significantly improved with mild residual enhancement (series 21, image 20) and FLAIR hyperintensity. No acute infarct, hemorrhage, hydrocephalus, or extra-axial fluid collection. Similar sequela of prior right MCA territory infarct with encephalomalacia and gliosis. Mild generalized cerebral volume  loss. Vascular: Major arterial flow voids are maintained at the skull base. Skull and upper cervical spine: Normal marrow signal. Sinuses/Orbits: Mild sinus mucosal thickening without air-fluid levels. Unremarkable orbits. Other: Trace bilateral mastoid effusions. IMPRESSION: 1. The previously described enhancing lesion in the high right frontal lobe is decreased in size, now 4 mm (6 mm on the prior). 2. The plaque-like enhancement along the pons is also improved with mild residual enhancement 3. There are two punctate new foci of enhancement in the right frontal lobe and the right frontoparietal region, which are only seen on one series. While these could be artifactual or vascular, they warrant attention on short interval follow-up. Electronically Signed   By: Margaretha Sheffield MD   On: 11/14/2020 12:18   DG Pelvis Portable  Result Date: 10/30/2020 CLINICAL DATA:  Fall EXAM: PORTABLE PELVIS 1-2 VIEWS COMPARISON:  CT PET of January 17, 2020 FINDINGS: Degenerative changes about the bilateral hips. Irregularity along the iliopectineal line on the LEFT.  Osteopenia. No displaced fractures. IMPRESSION: 1. Irregularity along the LEFT iliopectineal line, suspicious for nondisplaced fracture about the LEFT acetabulum. 2. Degenerative changes about the hips. Electronically Signed   By: Zetta Bills M.D.   On: 10/30/2020 13:03   DG CHEST PORT 1 VIEW  Result Date:  10/30/2020 CLINICAL DATA:  Fall. EXAM: PORTABLE CHEST 1 VIEW COMPARISON:  CT 09/17/2020.  Chest x-ray 10/07/2019. FINDINGS: Port-A-Cath noted with lead tip over the right ventricle. Cardiac pacer with lead tips over the right atrium right ventricle. Cardiomegaly. Diffuse bilateral interstitial prominence. Small left pleural effusion. Findings most consistent CHF. Pneumonitis cannot be excluded. IMPRESSION: 1.  Port-A-Cath noted with lead tip over the right ventricle. 2. Cardiac pacer with lead tips over the right atrium and right ventricle. Cardiomegaly with diffuse bilateral interstitial prominence and small left pleural effusion. Electronically Signed   By: Marcello Moores  Register   On: 10/30/2020 15:28   DG Tibia/Fibula Left Port  Result Date: 10/30/2020 CLINICAL DATA:  Fall, deformity of the knee. EXAM: PORTABLE LEFT TIBIA AND FIBULA - 2 VIEW COMPARISON:  Femoral and pelvic evaluation of the same date. FINDINGS: Overlapping stabilization device limiting assessment. No signs of fracture of the tibia or fibula. No joint effusion noted incidentally in the knee. Incidental note made of mid to distal femoral fracture at the upper margin of the submitted radiographs. Question of medial patellar subluxation versus is artifact from oblique nature of the submitted image. This could also be related to the severity of the fracture in the femur above. IMPRESSION: 1. No signs of fracture of the tibia or fibula. 2. Incidental note is made of mid to distal femoral fracture at the upper margin of the submitted radiographs. 3. Question medial patellar subluxation versus artifact from oblique nature of the submitted image. Electronically Signed   By: Zetta Bills M.D.   On: 10/30/2020 13:06   DG C-Arm 1-60 Min  Result Date: 10/31/2020 CLINICAL DATA:  Left femoral nail placement. EXAM: DG C-ARM 1-60 MIN; LEFT FEMUR 2 VIEWS FLUOROSCOPY TIME:  Fluoroscopy Time:  2 minutes and 52 seconds Reported radiation: 18 mGy. COMPARISON:   Radiographs from October 30, 2020 FINDINGS: Eight C-arm fluoroscopic images were obtained intraoperatively and submitted for post operative interpretation. These images demonstrate intramedullary nail and screw fixation of a spiral fracture of the distal femoral diaphysis. Improved alignment. No unexpected findings. Please see the performing provider's procedural report for further detail. IMPRESSION: Intraoperative fluoroscopic imaging, as detailed above. Electronically Signed   By: Roslynn Amble  Ronnald Ramp MD   On: 10/31/2020 14:39   DG FEMUR PORT 1V LEFT  Result Date: 10/30/2020 CLINICAL DATA:  Fall, deformity about the knee. EXAM: LEFT FEMUR PORTABLE 1 VIEW COMPARISON:  None FINDINGS: Distal femoral diaphyseal spiral fracture with butterfly fragment and a half shaft with medial displacement of the distal fracture fragment, mild apex medial angulation and moderate wapex anterior angulation at the fracture site. Complete displacement and over riding noted on the lateral view, distal fracture fragment dorsally displaced relative to proximal femur. On lateral view dorsal displacement of fracture fragments are in close proximity to calcified femoral vasculature. Lateral view of the LEFT hip/proximal femur with limited evaluation due to overlap of stabilization device. IMPRESSION: Displaced and angulated spiral fracture of the distal femoral diaphysis as described. Limited evaluation of the LEFT hip/proximal femur due to overlap of stabilization device. Markedly displaced mid to distal femoral fracture is in close proximity to calcified femoral vasculature. Consider CT for vascular assessment as clinically warranted. Electronically Signed   By: Zetta Bills M.D.   On: 10/30/2020 12:56   DG FEMUR MIN 2 VIEWS LEFT  Result Date: 11/13/2020 CLINICAL DATA:  Status post open reduction internal fixation of left femur fracture. EXAM: LEFT FEMUR 2 VIEWS COMPARISON:  October 31, 2020. FINDINGS: Status post intramedullary rod  fixation of comminuted distal left femoral fracture. Good alignment of fracture components is noted. IMPRESSION: Status post intramedullary rod fixation of comminuted distal left femoral fracture. Electronically Signed   By: Marijo Conception M.D.   On: 11/13/2020 09:01   DG FEMUR MIN 2 VIEWS LEFT  Result Date: 10/31/2020 CLINICAL DATA:  Left femoral nail placement. EXAM: DG C-ARM 1-60 MIN; LEFT FEMUR 2 VIEWS FLUOROSCOPY TIME:  Fluoroscopy Time:  2 minutes and 52 seconds Reported radiation: 18 mGy. COMPARISON:  Radiographs from October 30, 2020 FINDINGS: Eight C-arm fluoroscopic images were obtained intraoperatively and submitted for post operative interpretation. These images demonstrate intramedullary nail and screw fixation of a spiral fracture of the distal femoral diaphysis. Improved alignment. No unexpected findings. Please see the performing provider's procedural report for further detail. IMPRESSION: Intraoperative fluoroscopic imaging, as detailed above. Electronically Signed   By: Margaretha Sheffield MD   On: 10/31/2020 14:39   DG FEMUR PORT MIN 2 VIEWS LEFT  Result Date: 10/31/2020 CLINICAL DATA:  Postop fixation of a mid distal femur fracture. EXAM: LEFT FEMUR PORTABLE 2 VIEWS COMPARISON:  Radiographs, same date. FINDINGS: There is retrograde intramedullary rod in the femur with 2 proximal and 3 distal interlocking screws transfixing the complex comminuted femur fracture which is in good position and alignment. IMPRESSION: Internal fixation and near anatomic alignment of a complex comminuted femur fracture. Electronically Signed   By: Marijo Sanes M.D.   On: 10/31/2020 16:02   (Echo, Carotid, EGD, Colonoscopy, ERCP)    Subjective: Patient seen and examined.  No overnight events.  Pain controlled.  Frustrated being in the hospital.  Brother arrived in the hospital and he is eager to go home with his brother.  Patient stated that his brother and mother will help him all the time.  He wants to go  home getting stronger and go back on chemotherapy for his lung cancer.   Discharge Exam: Vitals:   11/28/20 0909 11/28/20 1048  BP: (!) 107/92 (!) 118/99  Pulse: (!) 106 (!) 104  Resp: 20 18  Temp: 97.9 F (36.6 C) 97.8 F (36.6 C)  SpO2: 95% 98%   Vitals:   11/28/20 0228 11/28/20 3546  11/28/20 0909 11/28/20 1048  BP: (!) 117/94 (!) 119/97 (!) 107/92 (!) 118/99  Pulse: (!) 102 (!) 103 (!) 106 (!) 104  Resp: '19 18 20 18  ' Temp: (!) 97.4 F (36.3 C) (!) 97.3 F (36.3 C) 97.9 F (36.6 C) 97.8 F (36.6 C)  TempSrc: Oral Oral Oral Oral  SpO2: 91% 100% 95% 98%  Weight:      Height:        General: Pt is alert, awake, not in acute distress Chronically sick looking, however not in any distress.  Looks fairly comfortable. Cardiovascular: RRR, S1/S2 +, no rubs, no gallops, AICD in place. Patient also has implanted port that is accessed for IV line. Respiratory: CTA bilaterally, no wheezing, no rhonchi Abdominal: Soft, NT, ND, bowel sounds + Extremities: no edema, no cyanosis Left lateral lower thigh incision clean and dry.    The results of significant diagnostics from this hospitalization (including imaging, microbiology, ancillary and laboratory) are listed below for reference.     Microbiology: No results found for this or any previous visit (from the past 240 hour(s)).   Labs: BNP (last 3 results) Recent Labs    10/30/20 2000  BNP 259.5*   Basic Metabolic Panel: Recent Labs  Lab 11/23/20 0306 11/24/20 0323 11/25/20 0354  NA 138 141 139  K 3.8 3.9 3.8  CL 105 106 105  CO2 '25 26 27  ' GLUCOSE 156* 87 177*  BUN 27* 30* 35*  CREATININE 1.23 1.30* 1.33*  CALCIUM 8.8* 9.0 8.7*   Liver Function Tests: No results for input(s): AST, ALT, ALKPHOS, BILITOT, PROT, ALBUMIN in the last 168 hours. No results for input(s): LIPASE, AMYLASE in the last 168 hours. No results for input(s): AMMONIA in the last 168 hours. CBC: Recent Labs  Lab 11/26/20 0406  WBC 9.0   HGB 10.6*  HCT 34.3*  MCV 101.8*  PLT 210   Cardiac Enzymes: No results for input(s): CKTOTAL, CKMB, CKMBINDEX, TROPONINI in the last 168 hours. BNP: Invalid input(s): POCBNP CBG: Recent Labs  Lab 11/27/20 1148 11/27/20 1639 11/27/20 2214 11/28/20 0810 11/28/20 1130  GLUCAP 132* 168* 245* 166* 134*   D-Dimer No results for input(s): DDIMER in the last 72 hours. Hgb A1c No results for input(s): HGBA1C in the last 72 hours. Lipid Profile No results for input(s): CHOL, HDL, LDLCALC, TRIG, CHOLHDL, LDLDIRECT in the last 72 hours. Thyroid function studies No results for input(s): TSH, T4TOTAL, T3FREE, THYROIDAB in the last 72 hours.  Invalid input(s): FREET3 Anemia work up No results for input(s): VITAMINB12, FOLATE, FERRITIN, TIBC, IRON, RETICCTPCT in the last 72 hours. Urinalysis    Component Value Date/Time   COLORURINE YELLOW 10/30/2020 1151   APPEARANCEUR CLEAR 10/30/2020 1151   LABSPEC 1.018 10/30/2020 1151   PHURINE 5.0 10/30/2020 1151   GLUCOSEU >=500 (A) 10/30/2020 1151   HGBUR MODERATE (A) 10/30/2020 1151   BILIRUBINUR NEGATIVE 10/30/2020 1151   KETONESUR NEGATIVE 10/30/2020 1151   PROTEINUR 100 (A) 10/30/2020 1151   UROBILINOGEN 1.0 07/16/2015 1715   NITRITE NEGATIVE 10/30/2020 1151   LEUKOCYTESUR NEGATIVE 10/30/2020 1151   Sepsis Labs Invalid input(s): PROCALCITONIN,  WBC,  LACTICIDVEN Microbiology No results found for this or any previous visit (from the past 240 hour(s)).   Time coordinating discharge:  40 minutes  SIGNED:   Barb Merino, MD  [facility] 11/28/2020, 1:08 PM

## 2020-11-28 NOTE — TOC Transition Note (Signed)
Transition of Care [facility]) - CM/SW Discharge Note   Patient Details  Name: Dennis [name]. MRN: [id number] Date of Birth: November 12, 1960  [facility] CM/SW Contact:  Sharin Mons, RN Phone Number: 11/28/2020, 12:50 PM   Clinical Narrative:    Patient will DC to: home  Anticipated DC date: 11/28/2020 Family notified: yes , brotherVenora Maples') Transport by: car  Admitted with L femur fx, hx of CHF, AICD CAD,  CVA , small cell lung cancer with mets to brain diagnosed on 12/31/2019, DM, alcohol abuse. From home with mom and brother.         - s/p intramedullary nailing of left femur fracture, 1/19  Per MD patient ready for DC today. RN, patient, and  patient's family. Per PT's note pt has progressed to [facility] need, not SNF need. NCM spoke with pt and pt's brother @ bedside in regard to d/c plan, home with [facility] services. Pt agreeable to [facility] services. Choice offered. Pt without preference. Referral made with [facility] and accepted. Per [facility]  PT will address OT needs @ home visits. Pt's bother [name]' stated pt will have 24/7 assistance and supervision provided by family.  Pt without Rx med concerns.  Post hospital f/u noted on AVS.    RNCM will sign off for now as intervention is no longer needed. Please consult [place] again if new needs arise.   Final next level of care: [place] Barriers to Discharge: No Barriers Identified   Patient Goals and CMS Choice     Choice offered to / list presented to : Patient  Discharge Placement                       Discharge Plan and Services     Post Acute Care Choice: [place]          DME Arranged: N/A         HH Arranged: PT,OT [place] Agency: [facility] Date [place]: 11/28/20 Time [place]: 8101 Representative spoke with at [place]: [place] Determinants of Health ([place]) Interventions     Readmission Risk Interventions No flowsheet data  found.

## 2020-11-28 NOTE — Progress Notes (Signed)
Dsicharge summary packet provided to pt/brother, with instrucitons. Pt/brother verbalized understanding of instructions . No complaints voiced. Pt remains alert/oriented in no apparent distress. Pt D/C to home with homehealth as ordered.All questins and concerns were fully addresssed.

## 2020-11-28 NOTE — Progress Notes (Signed)
   11/27/20 2240  Assess: MEWS Score  Temp (!) 97.4 F (36.3 C)  BP 117/89  Pulse Rate (!) 105  Resp (!) 24  SpO2 100 %  Assess: MEWS Score  MEWS Temp 0  MEWS Systolic 0  MEWS Pulse 1  MEWS RR 1  MEWS LOC 0  MEWS Score 2  MEWS Score Color Yellow  Take Vital Signs  Increase Vital Sign Frequency  Yellow: Q 2hr X 2 then Q 4hr X 2, if remains yellow, continue Q 4hrs  Escalate  MEWS: Escalate Yellow: discuss with charge nurse/RN and consider discussing with provider and RRT  Notify: Charge Nurse/RN  Name of Charge Nurse/RN Notified Lauren, RN  Date Charge Nurse/RN Notified 11/27/20  Time Charge Nurse/RN Notified 2300  Notify: Provider  Provider Name/Title on call Amion  Date Provider Notified 11/27/20  Time Provider Notified 2300  Notification Type Page  Notification Reason Change in status  Response No new orders  Document  Patient Outcome Other (Comment) (Pt is stable)  Progress note created (see row info) Yes

## 2020-11-28 NOTE — Care Management Important Message (Signed)
Important Message  Patient Details  Name: Dennis [name]. MRN: [id number] Date of Birth: 05-11-1961   Medicare Important Message Given:  Yes - Important Message mailed due to current National Emergency   Verbal consent obtained due to current National Emergency  Relationship to patient: Self Contact Name: Duayne Brideau Call Date: 11/23/20  Time: 1305 Phone: [phone] Outcome: No Answer/Busy Important Message mailed to: Patient address on file    Delorse Lek 11/28/2020, 3:06 PM

## 2020-12-03 ENCOUNTER — Ambulatory Visit (INDEPENDENT_AMBULATORY_CARE_PROVIDER_SITE_OTHER): Payer: Medicare Other

## 2020-12-03 DIAGNOSIS — I255 Ischemic cardiomyopathy: Secondary | ICD-10-CM

## 2020-12-04 ENCOUNTER — Telehealth: Payer: Self-pay

## 2020-12-04 NOTE — Telephone Encounter (Signed)
Pt was discharged from hospital 11/28/20 home with [facility].  Last INR 11/26/20 2.0.  Pt needs INR done ASAP, doesn't look like this was arranged for or addressed at discharge. Called [facility] [phone] spoke with June, PT in regards to skilled nursing being ordered by PCP for med management for CHF.  Asked if while pt was receiving skilled nursing for med management for CHF if the nurse could check pt's INR as well and call to Coumadin Clinic for dosing.  June took verbal order for PT/INR weekly, but is still awaiting approval from PCP for skilled nursing.  Advised him that Dr Haroldine Laws is pt's cardiologist and manages his CHF in the CHF clinic, will forward note to Nira Conn, RN his nurse requesting assistance in getting verbal order for skilled nursing in pt's home for med management/CHF.  Will forward message.

## 2020-12-05 ENCOUNTER — Ambulatory Visit (INDEPENDENT_AMBULATORY_CARE_PROVIDER_SITE_OTHER): Payer: Medicare Other | Admitting: Cardiovascular Disease

## 2020-12-05 DIAGNOSIS — Z5181 Encounter for therapeutic drug level monitoring: Secondary | ICD-10-CM

## 2020-12-05 LAB — POCT INR: INR: 3.2 — AB (ref 2.0–3.0)

## 2020-12-05 NOTE — Telephone Encounter (Signed)
Spoke w/[facility], they state skilled nursing will start this week for pt, either Thur or Fri

## 2020-12-05 NOTE — Patient Instructions (Addendum)
Description   Spoke to Jana Half from [facility] who was at pt's home,  instructed for pt to hold warfarin today and then continue taking Warfarin 1/2 tablet daily except 1 tablet on Tuesdays and Thursdays. Recheck on 3/1 by [facility].  Main # [phone] Coumadin Clinic # [phone].

## 2020-12-05 NOTE — Telephone Encounter (Signed)
[facility] called after obtaining the INR; please refer to Anticoagulation Track for details and INR recheck.

## 2020-12-06 LAB — CUP PACEART REMOTE DEVICE CHECK
Battery Remaining Longevity: 0 mo
Battery Voltage: 2.57 V
Brady Statistic RV Percent Paced: 0 %
Date Time Interrogation Session: 20220222195538
HighPow Impedance: 54 Ohm
HighPow Impedance: 54 Ohm
Implantable Lead Implant Date: 20110513
Implantable Lead Implant Date: 20110513
Implantable Lead Location: 753859
Implantable Lead Location: 753860
Implantable Pulse Generator Implant Date: 20110513
Lead Channel Impedance Value: 260 Ohm
Lead Channel Pacing Threshold Amplitude: 0.75 V
Lead Channel Pacing Threshold Pulse Width: 0.5 ms
Lead Channel Sensing Intrinsic Amplitude: 12 mV
Lead Channel Setting Pacing Amplitude: 2.5 V
Lead Channel Setting Pacing Pulse Width: 0.5 ms
Lead Channel Setting Sensing Sensitivity: 0.5 mV
Pulse Gen Serial Number: 608100

## 2020-12-06 NOTE — Progress Notes (Signed)
Remote ICD transmission.   

## 2020-12-06 NOTE — Addendum Note (Signed)
Addended by: Cheri Kearns A on: 12/06/2020 03:55 PM   Modules accepted: Level of Service

## 2020-12-10 ENCOUNTER — Other Ambulatory Visit: Payer: Self-pay | Admitting: Medical Oncology

## 2020-12-11 ENCOUNTER — Other Ambulatory Visit: Payer: Self-pay | Admitting: Medical Oncology

## 2020-12-11 ENCOUNTER — Ambulatory Visit (INDEPENDENT_AMBULATORY_CARE_PROVIDER_SITE_OTHER): Payer: Medicare Other

## 2020-12-11 DIAGNOSIS — I6930 Unspecified sequelae of cerebral infarction: Secondary | ICD-10-CM

## 2020-12-11 DIAGNOSIS — Z7901 Long term (current) use of anticoagulants: Secondary | ICD-10-CM

## 2020-12-11 LAB — POCT INR: INR: 2.2 (ref 2.0–3.0)

## 2020-12-11 NOTE — Patient Instructions (Signed)
Description   Spoke to Jana Half from [facility], instructed to have pt continue on same dosage of Warfarin 1/2 tablet daily except 1 tablet on Tuesdays and Thursdays. Recheck in 1 week by [facility].  Main # [phone] Coumadin Clinic # [phone].

## 2020-12-18 ENCOUNTER — Inpatient Hospital Stay: Payer: Medicare Other

## 2020-12-18 ENCOUNTER — Other Ambulatory Visit: Payer: Self-pay

## 2020-12-18 ENCOUNTER — Inpatient Hospital Stay: Payer: Medicare Other | Attending: Internal Medicine | Admitting: Internal Medicine

## 2020-12-18 ENCOUNTER — Inpatient Hospital Stay (HOSPITAL_BASED_OUTPATIENT_CLINIC_OR_DEPARTMENT_OTHER): Payer: Medicare Other | Admitting: Internal Medicine

## 2020-12-18 VITALS — BP 102/81 | HR 95 | Temp 97.7°F | Resp 18 | Ht 72.0 in | Wt 190.1 lb

## 2020-12-18 DIAGNOSIS — Z9981 Dependence on supplemental oxygen: Secondary | ICD-10-CM | POA: Insufficient documentation

## 2020-12-18 DIAGNOSIS — Z87891 Personal history of nicotine dependence: Secondary | ICD-10-CM | POA: Diagnosis not present

## 2020-12-18 DIAGNOSIS — I82402 Acute embolism and thrombosis of unspecified deep veins of left lower extremity: Secondary | ICD-10-CM | POA: Diagnosis not present

## 2020-12-18 DIAGNOSIS — Z8616 Personal history of COVID-19: Secondary | ICD-10-CM | POA: Insufficient documentation

## 2020-12-18 DIAGNOSIS — R4781 Slurred speech: Secondary | ICD-10-CM | POA: Diagnosis not present

## 2020-12-18 DIAGNOSIS — Z7901 Long term (current) use of anticoagulants: Secondary | ICD-10-CM | POA: Diagnosis not present

## 2020-12-18 DIAGNOSIS — C342 Malignant neoplasm of middle lobe, bronchus or lung: Secondary | ICD-10-CM | POA: Diagnosis not present

## 2020-12-18 DIAGNOSIS — J90 Pleural effusion, not elsewhere classified: Secondary | ICD-10-CM | POA: Diagnosis not present

## 2020-12-18 DIAGNOSIS — I509 Heart failure, unspecified: Secondary | ICD-10-CM | POA: Insufficient documentation

## 2020-12-18 DIAGNOSIS — C7931 Secondary malignant neoplasm of brain: Secondary | ICD-10-CM | POA: Insufficient documentation

## 2020-12-18 DIAGNOSIS — R2681 Unsteadiness on feet: Secondary | ICD-10-CM

## 2020-12-18 DIAGNOSIS — R918 Other nonspecific abnormal finding of lung field: Secondary | ICD-10-CM

## 2020-12-18 DIAGNOSIS — J811 Chronic pulmonary edema: Secondary | ICD-10-CM | POA: Insufficient documentation

## 2020-12-18 DIAGNOSIS — C3490 Malignant neoplasm of unspecified part of unspecified bronchus or lung: Secondary | ICD-10-CM | POA: Diagnosis not present

## 2020-12-18 DIAGNOSIS — Z79899 Other long term (current) drug therapy: Secondary | ICD-10-CM | POA: Insufficient documentation

## 2020-12-18 DIAGNOSIS — C3411 Malignant neoplasm of upper lobe, right bronchus or lung: Secondary | ICD-10-CM

## 2020-12-18 LAB — CBC WITH DIFFERENTIAL (CANCER CENTER ONLY)
Abs Immature Granulocytes: 0.05 10*3/uL (ref 0.00–0.07)
Basophils Absolute: 0 10*3/uL (ref 0.0–0.1)
Basophils Relative: 0 %
Eosinophils Absolute: 0 10*3/uL (ref 0.0–0.5)
Eosinophils Relative: 0 %
HCT: 36.4 % — ABNORMAL LOW (ref 39.0–52.0)
Hemoglobin: 11 g/dL — ABNORMAL LOW (ref 13.0–17.0)
Immature Granulocytes: 1 %
Lymphocytes Relative: 13 %
Lymphs Abs: 1.3 10*3/uL (ref 0.7–4.0)
MCH: 29.8 pg (ref 26.0–34.0)
MCHC: 30.2 g/dL (ref 30.0–36.0)
MCV: 98.6 fL (ref 80.0–100.0)
Monocytes Absolute: 0.6 10*3/uL (ref 0.1–1.0)
Monocytes Relative: 6 %
Neutro Abs: 7.7 10*3/uL (ref 1.7–7.7)
Neutrophils Relative %: 80 %
Platelet Count: 198 10*3/uL (ref 150–400)
RBC: 3.69 MIL/uL — ABNORMAL LOW (ref 4.22–5.81)
RDW: 16.6 % — ABNORMAL HIGH (ref 11.5–15.5)
WBC Count: 9.7 10*3/uL (ref 4.0–10.5)
nRBC: 0.4 % — ABNORMAL HIGH (ref 0.0–0.2)

## 2020-12-18 LAB — CMP (CANCER CENTER ONLY)
ALT: 45 U/L — ABNORMAL HIGH (ref 0–44)
AST: 28 U/L (ref 15–41)
Albumin: 2.8 g/dL — ABNORMAL LOW (ref 3.5–5.0)
Alkaline Phosphatase: 163 U/L — ABNORMAL HIGH (ref 38–126)
Anion gap: 8 (ref 5–15)
BUN: 24 mg/dL — ABNORMAL HIGH (ref 6–20)
CO2: 24 mmol/L (ref 22–32)
Calcium: 8.8 mg/dL — ABNORMAL LOW (ref 8.9–10.3)
Chloride: 110 mmol/L (ref 98–111)
Creatinine: 1.17 mg/dL (ref 0.61–1.24)
GFR, Estimated: >60 mL/min (ref >60)
Glucose, Bld: 97 mg/dL (ref 70–99)
Potassium: 3.5 mmol/L (ref 3.5–5.1)
Sodium: 142 mmol/L (ref 135–145)
Total Bilirubin: 0.5 mg/dL (ref 0.3–1.2)
Total Protein: 6 g/dL — ABNORMAL LOW (ref 6.5–8.1)

## 2020-12-18 LAB — TSH: TSH: 0.59 u[IU]/mL (ref 0.320–4.118)

## 2020-12-18 MED ORDER — DEXAMETHASONE 1 MG PO TABS: 1.0000 mg | ORAL_TABLET | Freq: Every day | ORAL | 2 refills | Status: AC

## 2020-12-18 MED ORDER — SODIUM CHLORIDE 0.9% FLUSH
10.0000 mL | Freq: Once | INTRAVENOUS | Status: AC
  Administered 2020-12-18: 10 mL via INTRAVENOUS
  Filled 2020-12-18: qty 10

## 2020-12-18 MED ORDER — HEPARIN SOD (PORK) LOCK FLUSH 100 UNIT/ML IV SOLN
500.0000 [IU] | Freq: Once | INTRAVENOUS | Status: AC
  Administered 2020-12-18: 500 [IU] via INTRAVENOUS
  Filled 2020-12-18: qty 5

## 2020-12-18 NOTE — Progress Notes (Signed)
[facility] Telephone:([phone]) [phone]   [phone]) [phone]  OFFICE PROGRESS NOTE  [name], MD [place] [id number]  DIAGNOSIS: Extensive stage small cell lung cancer. He presented with a spiculated right middle lobe pulmonary nodule and mildly enlargedsolitary aortopulmonary lymph node. 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 He was diagnosed in March 2021.  PRIOR THERAPY: 1) Whole brain radiation under the care of Dr. Lisbeth Renshaw. Completed 01/17/2020  CURRENT THERAPY: Systemic chemotherapy withcarboplatin for an AUC of 5 on day 1,etoposide 100 mg/m on days 1, 2, and 3, and Imfinzi 1500 mg on day 1 IV every 3 weeks. First dose expected on4/20/21. Status post 7  cycles.  Starting from cycle #5 the patient will be treated with maintenance Imfinzi 1500 mg IV every 4 weeks.  INTERVAL HISTORY: Dennis [name]. 60 y.o. male returns to the clinic today for follow-up visit.  The patient has been off treatment since December 2021.  He had several hospitalization over the last few months including admission to the hospital with left distal femur fracture after a fall on October 30, 2020.  He was also tested positive for Covid 19 at that time.  The patient underwent retrograde intramedullary nailing of the left femur fracture by Dr. Doreatha Martin.  He was discharged to a skilled nursing facility.  The patient came to the clinic today for evaluation and consideration of resuming his treatment.  He is complaining of swelling of the left leg that has been worse after the surgical procedure. He denied having any chest pain, shortness of breath, cough or hemoptysis.  He denied having any nausea, vomiting, diarrhea or constipation.  He has no headache or visual changes.  MEDICAL HISTORY: Past Medical History:  Diagnosis Date  . Alcohol abuse with alcohol-induced mood disorder ([place]) 08/18/2015  . Automatic  implantable cardioverter-defibrillator in situ 2012   S/P St Jude Dual Chamber. ICD.  Marland Kitchen CAD (coronary artery disease) 11/16/2014   CAD Coronary angiography (12/2008) with mid LAD totally occluded and collateralized.  . Cardiac LV ejection fraction 10-20%   . Chest pain 09/04/2018  . CHF (congestive heart failure) ([place])   . Chronic systolic heart failure (HCC)    a) Mixed ICM/NICM b) RHC (05/2014): RA 2, RV 19/2/3, PA 22/14 (18), PCWP 6, Fick CO/CI: 5.2 / 2.7, PVR 2.3 WU, PA 60% and 64% c) ECHO (05/2014): EF 20-25%, diff HK, akinesis entireanteroseptal myocardium, triv AI, mod MR, LA mod/sev dilated  . Cocaine abuse ([place]) 01/24/2015  . Cocaine abuse with cocaine-induced mood disorder ([place]) 08/20/2015  . Drug abuse and dependence ([place])   . Dysphagia following cerebral infarction 01/24/2015  . Embolic stroke involving right middle cerebral artery ([place])   . Extensive stage primary small cell carcinoma of lung ([place]) 01/04/2020  . H/O noncompliance with medical treatment, presenting hazards to health 01/24/2015  . Heart murmur   . High cholesterol   . Ischemic cardiomyopathy    a) Coronary angiography (12/2008) at [facility]: Lmain: nl, LAD mid 100% stenosis with left to left and right-to-left collaterals to the distal LAD; Lcx: nl, RCA nl.    . 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[place])   . MDD (major depressive disorder), recurrent severe, without psychosis ([place]) 08/18/2015  . Open wound of foot 02/27/2015  . Pneumonia 1988  . Psychoactive substance-induced mood disorder ([place]) 07/17/2015  . Sleep apnea    "cleared after T&A"  .  Status post PICC central line placement   . Stroke ([place])   . Substance induced mood disorder ([place]) 08/17/2015  . Type II diabetes mellitus (HCC)     ALLERGIES:  has No Known Allergies.  MEDICATIONS:  Current Outpatient Medications  Medication Sig Dispense Refill  . Accu-Chek Softclix Lancets lancets     . atorvastatin (LIPITOR) 20 MG tablet TAKE 1 TABLET BY MOUTH DAILY AT 6 PM.  (Patient not taking: No sig reported) 30 tablet 6  . Blood Glucose Monitoring Suppl (FIFTY50 GLUCOSE METER 2.0) w/Device KIT Use as instructed    . dexamethasone (DECADRON) 2 MG tablet Take 1 tablet (2 mg total) by mouth daily. 60 tablet 0  . feeding supplement (ENSURE ENLIVE / ENSURE PLUS) LIQD Take 237 mLs by mouth 2 (two) times daily between meals. 237 mL 0  . furosemide (LASIX) 20 MG tablet Take 1 tablet (20 mg total) by mouth daily. 30 tablet 0  . HYDROcodone-acetaminophen (NORCO/VICODIN) 5-325 MG tablet Take 1 tablet by mouth every 6 (six) hours as needed for severe pain. 20 tablet 0  . insulin glargine (LANTUS SOLOSTAR) 100 UNIT/ML Solostar Pen Inject 25 Units into the skin 2 (two) times daily. (Patient taking differently: Inject 25 Units into the skin in the morning and at bedtime.) 15 mL 2  . insulin lispro (HUMALOG KWIKPEN) 100 UNIT/ML KwikPen Inject 6 Units into the skin 3 (three) times daily. (Patient taking differently: Inject 6 Units into the skin 3 (three) times daily with meals.) 15 mL 1  . INSUPEN ULTRAFIN 31G X 8 MM MISC     . lidocaine-prilocaine (EMLA) cream Apply 1 application topically as needed. (Patient taking differently: Apply 1 application topically as needed (as directed).) 30 g 2  . methocarbamol (ROBAXIN) 500 MG tablet Take 1 tablet (500 mg total) by mouth every 6 (six) hours as needed for muscle spasms.    . midodrine (PROAMATINE) 10 MG tablet Take 1 tablet (10 mg total) by mouth 3 (three) times daily with meals. 90 tablet 2  . Multiple Vitamin (MULTIVITAMIN WITH MINERALS) TABS tablet Take 1 tablet by mouth daily.    . potassium chloride (KLOR-CON) 10 MEQ tablet Take 1 tablet (10 mEq total) by mouth daily. 90 each 0  . prochlorperazine (COMPAZINE) 10 MG tablet Take 10 mg by mouth every 6 (six) hours as needed for nausea or vomiting.    . senna (SENOKOT) 8.6 MG TABS tablet Take 1 tablet (8.6 mg total) by mouth 2 (two) times daily. 120 tablet 0  . sennosides-docusate  sodium (SENOKOT-S) 8.6-50 MG tablet Take 1 tablet by mouth daily as needed for constipation.    . sodium chloride 1 g tablet Take 1 tablet (1 g total) by mouth in the morning.    . tamsulosin (FLOMAX) 0.4 MG CAPS capsule Take 0.4 mg by mouth daily.    . Vitamin D3 (VITAMIN D) 25 MCG tablet Take 2 tablets (2,000 Units total) by mouth 2 (two) times daily. 120 tablet 0  . warfarin (COUMADIN) 7.5 MG tablet TAKE 1 TABLET BY MOUTH DAILY AT 6PM. (Patient taking differently: Take 3.75-7.5 mg by mouth See admin instructions. Take 3.75 mg by mouth in the evening on Sun/Mon/Wed/Thurs/Fri/Sat and 7.5 on Tues) 30 tablet 0   No current facility-administered medications for this visit.    SURGICAL HISTORY:  Past Surgical History:  Procedure Laterality Date  . BRONCHIAL BRUSHINGS  12/28/2019   Procedure: BRONCHIAL BRUSHINGS;  Surgeon: Garner Nash, DO;  Location: [place];  Service: Thoracic;;  . BRONCHIAL NEEDLE ASPIRATION BIOPSY  12/28/2019   Procedure: BRONCHIAL NEEDLE ASPIRATION BIOPSIES;  Surgeon: Garner Nash, DO;  Location: [facility];  Service: Thoracic;;  . BRONCHIAL WASHINGS  12/28/2019   Procedure: BRONCHIAL WASHINGS;  Surgeon: Garner Nash, DO;  Location: [facility];  Service: Thoracic;;  . CARDIAC CATHETERIZATION  05/2014  . CARDIAC DEFIBRILLATOR PLACEMENT  03/2011  . CHOLECYSTECTOMY  1994  . FEMUR IM NAIL Left 10/31/2020   Procedure: INTRAMEDULLARY (IM) RETROGRADE FEMORAL NAILING;  Surgeon: Shona Needles, MD;  Location: [place];  Service: Orthopedics;  Laterality: Left;  . FOREARM FRACTURE SURGERY Left 1980  . FRACTURE SURGERY    . IR IMAGING GUIDED PORT INSERTION  10/11/2020  . RIGHT HEART CATHETERIZATION N/A 05/30/2014   Procedure: RIGHT HEART CATH;  Surgeon: Jolaine Artist, MD;  Location: [facility] CATH LAB;  Service: Cardiovascular;  Laterality: N/A;  . RIGHT HEART CATHETERIZATION N/A 02/07/2015   Procedure: RIGHT HEART CATH;  Surgeon: Jolaine Artist, MD;  Location: [facility]  CATH LAB;  Service: Cardiovascular;  Laterality: N/A;  . TONSILLECTOMY AND ADENOIDECTOMY  ~ 1998  . VIDEO BRONCHOSCOPY WITH ENDOBRONCHIAL ULTRASOUND N/A 12/28/2019   Procedure: VIDEO BRONCHOSCOPY WITH ENDOBRONCHIAL ULTRASOUND;  Surgeon: Garner Nash, DO;  Location: [facility];  Service: Thoracic;  Laterality: N/A;    REVIEW OF SYSTEMS:  Constitutional: positive for fatigue Eyes: negative Ears, nose, mouth, throat, and face: negative Respiratory: negative Cardiovascular: negative Gastrointestinal: negative Genitourinary:negative Integument/breast: negative Hematologic/lymphatic: negative Musculoskeletal:negative Neurological: negative Behavioral/Psych: negative Endocrine: negative Allergic/Immunologic: negative   PHYSICAL EXAMINATION: General appearance: alert, cooperative, fatigued and no distress Head: Normocephalic, without obvious abnormality, atraumatic Neck: no adenopathy, no JVD, supple, symmetrical, trachea midline and thyroid not enlarged, symmetric, no tenderness/mass/nodules Lymph nodes: Cervical, supraclavicular, and axillary nodes normal. Resp: clear to auscultation bilaterally Back: symmetric, no curvature. ROM normal. No CVA tenderness. Cardio: regular rate and rhythm, S1, S2 normal, no murmur, click, rub or gallop GI: soft, non-tender; bowel sounds normal; no masses,  no organomegaly Extremities: edema 1 plus edema of the left lower extremity Neurologic: Alert and oriented X 3, normal strength and tone. Normal symmetric reflexes. Normal coordination and gait  ECOG PERFORMANCE STATUS: 1 - Symptomatic but completely ambulatory  There were no vitals taken for this visit.  LABORATORY DATA: Lab Results  Component Value Date   WBC 9.7 12/18/2020   HGB 11.0 (L) 12/18/2020   HCT 36.4 (L) 12/18/2020   MCV 98.6 12/18/2020   PLT 198 12/18/2020      Chemistry      Component Value Date/Time   NA 139 11/25/2020 0354   K 3.8 11/25/2020 0354   CL 105 11/25/2020  0354   CO2 27 11/25/2020 0354   BUN 35 (H) 11/25/2020 0354   CREATININE 1.33 (H) 11/25/2020 0354   CREATININE 1.40 (H) 09/17/2020 1232      Component Value Date/Time   CALCIUM 8.7 (L) 11/25/2020 0354   ALKPHOS 78 11/04/2020 0350   AST 21 11/04/2020 0350   AST 20 09/17/2020 1232   ALT 14 11/04/2020 0350   ALT 39 09/17/2020 1232   BILITOT 0.7 11/04/2020 0350   BILITOT 0.3 09/17/2020 1232       RADIOGRAPHIC STUDIES: CUP PACEART REMOTE DEVICE CHECK  Result Date: 12/06/2020 Alert for tachy therapy disabled. Routing to triage for further evaluation. Known ERI status. HB Pacerart note dated 11/14/20, tachy therapies disabled by industry via order from Dr. Lovena Le.  No plans for gen change Confirmed  with industry rep, tachy therapies were disabled at request of Dr. Lovena Le, via device clinic RN as noted in [place]. Tommye Standard, PA-c   ASSESSMENT AND PLAN: This is a very pleasant 60 years old African-American male recently diagnosed with extensive stage small cell lung cancer presented with spiculated right middle lobe pulmonary nodule in addition to mediastinal lymphadenopathy and multiple metastatic brain lesions.  The patient is status post whole brain irradiation. He is currently undergoing systemic chemotherapy with carboplatin, etoposide and Imfinzi status post 8 cycles.  Starting from cycle #5 the patient is on maintenance treatment with immunotherapy with Imfinzi every 4 weeks. The patient has been off treatment for the last few months secondary to frequent hospitalization with COVID-19 as well as fracture of the left femur.  He is recovering well from his hospitalization and the surgery for the left femur. I recommended for him to have repeat CT scan of the chest, abdomen pelvis for restaging of his disease before resuming his treatment. For the swelling of the left lower extremity, I will arrange for the patient to have ultrasound Doppler of the lower extremities to rule out deep venous  thrombosis. The patient will come back for follow-up visit next week for evaluation and discussion of his scan results before resuming his treatment. If there is any evidence for deep venous thrombosis, I will start the patient on anticoagulation. He was advised to call immediately if he has any other concerning symptoms in the interval. The patient voices understanding of current disease status and treatment options and is in agreement with the current care plan.  All questions were answered. The patient knows to call the clinic with any problems, questions or concerns. We can certainly see the patient much sooner if necessary.  Disclaimer: This note was dictated with voice recognition software. Similar sounding words can inadvertently be transcribed and may not be corrected upon review.

## 2020-12-18 NOTE — Progress Notes (Signed)
[facility] [place], [place] [address] [phone]   Interval Evaluation  Date of Service: 12/18/20 Patient Name: Dennis [name]. Patient MRN: [id number] Patient DOB: November 12, 1960 Provider: Ventura Sellers, MD  Identifying Statement:  Dennis [name]. is a 60 y.o. male with brain metastases   Primary Cancer:  Oncologic History: Oncology History  Small cell lung cancer, right upper lobe ([place])  01/19/2020 Initial Diagnosis   Small cell lung cancer, right upper lobe ([place])   01/31/2020 -  Chemotherapy   The patient had palonosetron (ALOXI) injection 0.25 mg, 0.25 mg, Intravenous,  Once, 4 of 4 cycles Administration: 0.25 mg (01/31/2020), 0.25 mg (03/14/2020), 0.25 mg (04/03/2020), 0.25 mg (02/21/2020) pegfilgrastim-cbqv (UDENYCA) injection 6 mg, 6 mg, Subcutaneous, Once, 4 of 4 cycles Administration: 6 mg (02/04/2020), 6 mg (02/25/2020), 6 mg (03/20/2020), 6 mg (04/07/2020) CARBOplatin (PARAPLATIN) 370 mg in sodium chloride 0.9 % 250 mL chemo infusion, 370 mg (100 % of original dose 367 mg), Intravenous,  Once, 4 of 4 cycles Dose modification: 367 mg (original dose 367 mg, Cycle 1), 430.5 mg (original dose 430.5 mg, Cycle 3), 419.5 mg (original dose 419.5 mg, Cycle 4), 357 mg (original dose 357 mg, Cycle 2) Administration: 370 mg (01/31/2020), 390 mg (03/14/2020), 420 mg (04/03/2020), 360 mg (02/21/2020) etoposide (VEPESID) 190 mg in sodium chloride 0.9 % 500 mL chemo infusion, 100 mg/m2 = 190 mg, Intravenous,  Once, 4 of 4 cycles Administration: 190 mg (01/31/2020), 190 mg (02/01/2020), 190 mg (02/02/2020), 190 mg (03/14/2020), 190 mg (03/15/2020), 190 mg (03/16/2020), 190 mg (04/03/2020), 190 mg (04/04/2020), 190 mg (04/05/2020), 190 mg (02/21/2020), 190 mg (02/22/2020), 190 mg (02/23/2020) fosaprepitant (EMEND) 150 mg in sodium chloride 0.9 % 145 mL IVPB, 150 mg, Intravenous,  Once, 4 of 4 cycles Administration: 150 mg (01/31/2020), 150 mg (03/14/2020), 150 mg  (04/03/2020), 150 mg (02/21/2020) durvalumab (IMFINZI) 1,500 mg in sodium chloride 0.9 % 100 mL chemo infusion, 1,500 mg, Intravenous,  Once, 8 of 13 cycles Administration: 1,500 mg (01/31/2020), 1,500 mg (03/14/2020), 1,500 mg (04/03/2020), 1,500 mg (04/24/2020), 1,500 mg (05/23/2020), 1,500 mg (06/21/2020), 1,500 mg (09/17/2020), 1,500 mg (02/21/2020)  for chemotherapy treatment.     CNS Oncologic Hx 01/17/20: Completes WBRT 07/07/20: CT head demonstrates pontine lesion of unclear etiology  Interval History:  Dennis [name]. presents to clinic today following recent MRI brain and hospitalization for broken femur.  His leg continues to heal, he is currently bearing weight and ambulating around the home via walker.  Decadron down to 68m daily, no new or progressive neurologic deficits.  No seizures or headaches.  H+P (07/27/20) Patient presents to review recent neurologic symptoms and abnormal findings on CNS imaging.  He describes recent hospitalization, for which he presented on 07/08/20 with several days of progressive gait impairment, slurred speech and left sided numbness.  CT was performed which demonstrated anterior pontine lesion, consistent with possible metastasis.  Cardiology monitored MRI was then performed with AICD turned off, redemonstrating region of abnormality.  He feels relatively improved since corticosteroids were started during the admission.  He is working with physical therapy to regain some strength, currently ambulating 100% with a walker due to the imbalance.  Aside from balance issues he is functionally independent at this time.    Medications: Current Outpatient Medications on File Prior to Visit  Medication Sig Dispense Refill  . Accu-Chek Softclix Lancets lancets     . atorvastatin (LIPITOR) 20 MG tablet TAKE 1  TABLET BY MOUTH DAILY AT 6 PM. (Patient not taking: No sig reported) 30 tablet 6  . Blood Glucose Monitoring Suppl (FIFTY50 GLUCOSE METER 2.0) w/Device KIT Use as  instructed    . dexamethasone (DECADRON) 2 MG tablet Take 1 tablet (2 mg total) by mouth daily. 60 tablet 0  . feeding supplement (ENSURE ENLIVE / ENSURE PLUS) LIQD Take 237 mLs by mouth 2 (two) times daily between meals. 237 mL 0  . furosemide (LASIX) 20 MG tablet Take 1 tablet (20 mg total) by mouth daily. 30 tablet 0  . HYDROcodone-acetaminophen (NORCO/VICODIN) 5-325 MG tablet Take 1 tablet by mouth every 6 (six) hours as needed for severe pain. 20 tablet 0  . insulin glargine (LANTUS SOLOSTAR) 100 UNIT/ML Solostar Pen Inject 25 Units into the skin 2 (two) times daily. (Patient taking differently: Inject 25 Units into the skin in the morning and at bedtime.) 15 mL 2  . insulin lispro (HUMALOG KWIKPEN) 100 UNIT/ML KwikPen Inject 6 Units into the skin 3 (three) times daily. (Patient taking differently: Inject 6 Units into the skin 3 (three) times daily with meals.) 15 mL 1  . INSUPEN ULTRAFIN 31G X 8 MM MISC     . lidocaine-prilocaine (EMLA) cream Apply 1 application topically as needed. (Patient taking differently: Apply 1 application topically as needed (as directed).) 30 g 2  . methocarbamol (ROBAXIN) 500 MG tablet Take 1 tablet (500 mg total) by mouth every 6 (six) hours as needed for muscle spasms.    . midodrine (PROAMATINE) 10 MG tablet Take 1 tablet (10 mg total) by mouth 3 (three) times daily with meals. 90 tablet 2  . Multiple Vitamin (MULTIVITAMIN WITH MINERALS) TABS tablet Take 1 tablet by mouth daily.    . potassium chloride (KLOR-CON) 10 MEQ tablet Take 1 tablet (10 mEq total) by mouth daily. 90 each 0  . prochlorperazine (COMPAZINE) 10 MG tablet Take 10 mg by mouth every 6 (six) hours as needed for nausea or vomiting.    . senna (SENOKOT) 8.6 MG TABS tablet Take 1 tablet (8.6 mg total) by mouth 2 (two) times daily. 120 tablet 0  . sennosides-docusate sodium (SENOKOT-S) 8.6-50 MG tablet Take 1 tablet by mouth daily as needed for constipation.    . sodium chloride 1 g tablet Take 1  tablet (1 g total) by mouth in the morning.    . tamsulosin (FLOMAX) 0.4 MG CAPS capsule Take 0.4 mg by mouth daily.    . Vitamin D3 (VITAMIN D) 25 MCG tablet Take 2 tablets (2,000 Units total) by mouth 2 (two) times daily. 120 tablet 0  . warfarin (COUMADIN) 7.5 MG tablet TAKE 1 TABLET BY MOUTH DAILY AT 6PM. (Patient taking differently: Take 3.75-7.5 mg by mouth See admin instructions. Take 3.75 mg by mouth in the evening on Sun/Mon/Wed/Thurs/Fri/Sat and 7.5 on Tues) 30 tablet 0   No current facility-administered medications on file prior to visit.    Allergies: No Known Allergies Past Medical History:  Past Medical History:  Diagnosis Date  . Alcohol abuse with alcohol-induced mood disorder ([place]) 08/18/2015  . Automatic implantable cardioverter-defibrillator in situ 2012   S/P St Jude Dual Chamber. ICD.  Marland Kitchen CAD (coronary artery disease) 11/16/2014   CAD Coronary angiography (12/2008) with mid LAD totally occluded and collateralized.  . Cardiac LV ejection fraction 10-20%   . Chest pain 09/04/2018  . CHF (congestive heart failure) ([place])   . Chronic systolic heart failure (HCC)    a) Mixed ICM/NICM b) RHC (  05/2014): RA 2, RV 19/2/3, PA 22/14 (18), PCWP 6, Fick CO/CI: 5.2 / 2.7, PVR 2.3 WU, PA 60% and 64% c) ECHO (05/2014): EF 20-25%, diff HK, akinesis entireanteroseptal myocardium, triv AI, mod MR, LA mod/sev dilated  . Cocaine abuse ([place]) 01/24/2015  . Cocaine abuse with cocaine-induced mood disorder ([place]) 08/20/2015  . Drug abuse and dependence ([place])   . Dysphagia following cerebral infarction 01/24/2015  . Embolic stroke involving right middle cerebral artery ([place])   . Extensive stage primary small cell carcinoma of lung ([place]) 01/04/2020  . H/O noncompliance with medical treatment, presenting hazards to health 01/24/2015  . Heart murmur   . High cholesterol   . Ischemic cardiomyopathy    a) Coronary angiography (12/2008) at [facility]: Lmain: nl, LAD mid 100% stenosis with left to left and  right-to-left collaterals to the distal LAD; Lcx: nl, RCA nl.    . 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[place])   . MDD (major depressive disorder), recurrent severe, without psychosis ([place]) 08/18/2015  . Open wound of foot 02/27/2015  . Pneumonia 1988  . Psychoactive substance-induced mood disorder ([place]) 07/17/2015  . Sleep apnea    "cleared after T&A"  . Status post PICC central line placement   . Stroke ([place])   . Substance induced mood disorder ([place]) 08/17/2015  . Type II diabetes mellitus ([place])    Past Surgical History:  Past Surgical History:  Procedure Laterality Date  . BRONCHIAL BRUSHINGS  12/28/2019   Procedure: BRONCHIAL BRUSHINGS;  Surgeon: Garner Nash, DO;  Location: [facility];  Service: Thoracic;;  . BRONCHIAL NEEDLE ASPIRATION BIOPSY  12/28/2019   Procedure: BRONCHIAL NEEDLE ASPIRATION BIOPSIES;  Surgeon: Garner Nash, DO;  Location: [facility];  Service: Thoracic;;  . BRONCHIAL WASHINGS  12/28/2019   Procedure: BRONCHIAL WASHINGS;  Surgeon: Garner Nash, DO;  Location: [facility];  Service: Thoracic;;  . CARDIAC CATHETERIZATION  05/2014  . CARDIAC DEFIBRILLATOR PLACEMENT  03/2011  . CHOLECYSTECTOMY  1994  . FEMUR IM NAIL Left 10/31/2020   Procedure: INTRAMEDULLARY (IM) RETROGRADE FEMORAL NAILING;  Surgeon: Shona Needles, MD;  Location: [facility];  Service: Orthopedics;  Laterality: Left;  . FOREARM FRACTURE SURGERY Left 1980  . FRACTURE SURGERY    . IR IMAGING GUIDED PORT INSERTION  10/11/2020  . RIGHT HEART CATHETERIZATION N/A 05/30/2014   Procedure: RIGHT HEART CATH;  Surgeon: Jolaine Artist, MD;  Location: [facility] CATH LAB;  Service: Cardiovascular;  Laterality: N/A;  . RIGHT HEART CATHETERIZATION N/A 02/07/2015   Procedure: RIGHT HEART CATH;  Surgeon: Jolaine Artist, MD;  Location: [facility] CATH LAB;  Service: Cardiovascular;  Laterality: N/A;  . TONSILLECTOMY AND ADENOIDECTOMY  ~ 1998  . VIDEO BRONCHOSCOPY WITH ENDOBRONCHIAL ULTRASOUND N/A 12/28/2019   Procedure:  VIDEO BRONCHOSCOPY WITH ENDOBRONCHIAL ULTRASOUND;  Surgeon: Garner Nash, DO;  Location: [facility];  Service: Thoracic;  Laterality: N/A;   Social History:  Social History   Socioeconomic History  . Marital status: Single    Spouse name: Not on file  . Number of children: 1  . Years of education: 83  . Highest education level: Not on file  Occupational History  . Occupation: Disabled  Tobacco Use  . Smoking status: Former Smoker    Packs/day: 0.25    Years: 31.00    Pack years: 7.75    Types: Cigarettes  . Smokeless tobacco: Never Used  Vaping Use  . Vaping Use: Never used  Substance and Sexual Activity  . Alcohol use: Not Currently  Alcohol/week: 0.0 standard drinks    Comment: 09/21/2014 "might have a drink q 6 /months, if that"  . Drug use: Yes    Types: Cocaine    Comment: last use 07/06/2020  . Sexual activity: Yes  Other Topics Concern  . Not on file  Social History Narrative   Lives in [place] with his mother. No pets. Fun: movies, sports, daughter.    Denies religious beliefs that would effect health care.    Social Determinants of Health   Financial Resource Strain: Not on file  Food Insecurity: Not on file  Transportation Needs: Not on file  Physical Activity: Not on file  Stress: Not on file  Social Connections: Not on file  Intimate Partner Violence: Not on file   Family History:  Family History  Problem Relation Age of Onset  . Diabetes Mother   . Heart disease Mother   . Diabetes Father   . Prostate cancer Father   . Heart disease Father   . Diabetes Brother   . Diabetes Brother     Review of Systems: Constitutional: Doesn't report fevers, chills or abnormal weight loss Eyes: Doesn't report blurriness of vision Ears, nose, mouth, throat, and face: Doesn't report sore throat Respiratory: Doesn't report cough, dyspnea or wheezes Cardiovascular: Doesn't report palpitation, chest discomfort  Gastrointestinal:  Doesn't report nausea,  constipation, diarrhea GU: Doesn't report incontinence Skin: Doesn't report skin rashes Neurological: Per HPI Musculoskeletal: Left leg pain, swelling from fracture Behavioral/Psych: Doesn't report anxiety  Physical Exam: Vitals:   12/18/20 1254  BP: 102/81  Pulse: 95  Resp: 18  Temp: 97.7 F (36.5 C)  SpO2: 97%   KPS: 70. General: Alert, cooperative, pleasant, in no acute distress Head: Normal EENT: No conjunctival injection or scleral icterus.  Lungs: Resp effort normal Cardiac: Regular rate Abdomen: Non-distended abdomen Skin: No rashes cyanosis or petechiae. Extremities: ++edema LLE   Neurologic Exam: Mental Status: Awake, alert, attentive to examiner. Oriented to self and environment. Language is fluent with intact comprehension.  Cranial Nerves: Visual acuity is grossly normal. Visual fields are full. Extra-ocular movements intact. No ptosis. Face is symmetric Motor: Tone and bulk are normal. Power is full in both arms and legs, except left leg orthopedic limitations. Reflexes are symmetric, no pathologic reflexes present.  Sensory: Intact to light touch Gait: Wheelchair  Labs: I have reviewed the data as listed    Component Value Date/Time   NA 139 11/25/2020 0354   K 3.8 11/25/2020 0354   CL 105 11/25/2020 0354   CO2 27 11/25/2020 0354   GLUCOSE 177 (H) 11/25/2020 0354   BUN 35 (H) 11/25/2020 0354   CREATININE 1.33 (H) 11/25/2020 0354   CREATININE 1.40 (H) 09/17/2020 1232   CALCIUM 8.7 (L) 11/25/2020 0354   PROT 5.2 (L) 11/04/2020 0350   ALBUMIN 2.2 (L) 11/04/2020 0350   AST 21 11/04/2020 0350   AST 20 09/17/2020 1232   ALT 14 11/04/2020 0350   ALT 39 09/17/2020 1232   ALKPHOS 78 11/04/2020 0350   BILITOT 0.7 11/04/2020 0350   BILITOT 0.3 09/17/2020 1232   GFRNONAA >60 11/25/2020 0354   GFRNONAA 58 (L) 09/17/2020 1232   GFRAA >60 07/16/2020 0543   GFRAA 57 (L) 06/20/2020 1051   Lab Results  Component Value Date   WBC 9.0 11/26/2020   NEUTROABS  10.8 (H) 11/04/2020   HGB 10.6 (L) 11/26/2020   HCT 34.3 (L) 11/26/2020   MCV 101.8 (H) 11/26/2020   PLT 210 11/26/2020  Imaging:  CLINICAL DATA:  Brain mass or lesion.  Lung cancer.  EXAM: MRI HEAD WITHOUT AND WITH CONTRAST  TECHNIQUE: Multiplanar, multiecho pulse sequences of the brain and surrounding structures were obtained without and with intravenous contrast.  CONTRAST:  8.70m GADAVIST GADOBUTROL 1 MMOL/ML IV SOLN  COMPARISON:  MRI August 24, 2020.  FINDINGS: Brain: The previously described enhancing lesion in the high right frontal lobe is decreased in size and now 4 mm (see series 22, image 128 and series 21, image 16). A nearby, slightly more superior and anterior punctate enhancing lesion on the axial series in the high right frontal lobe (series 22, image 135) and a punctate enhancing lesion on the sagittal at the left frontoparietal junction (series 21, image 18) are not corroborated on other sequences. No other new enhancing lesions identified. The previously described plaque-like enhancement along the pons is significantly improved with mild residual enhancement (series 21, image 20) and FLAIR hyperintensity.  No acute infarct, hemorrhage, hydrocephalus, or extra-axial fluid collection. Similar sequela of prior right MCA territory infarct with encephalomalacia and gliosis. Mild generalized cerebral volume loss.  Vascular: Major arterial flow voids are maintained at the skull base.  Skull and upper cervical spine: Normal marrow signal.  Sinuses/Orbits: Mild sinus mucosal thickening without air-fluid levels. Unremarkable orbits.  Other: Trace bilateral mastoid effusions.  IMPRESSION: 1. The previously described enhancing lesion in the high right frontal lobe is decreased in size, now 4 mm (6 mm on the prior). 2. The plaque-like enhancement along the pons is also improved with mild residual enhancement 3. There are two punctate new foci  of enhancement in the right frontal lobe and the right frontoparietal region, which are only seen on one series. While these could be artifactual or vascular, they warrant attention on short interval follow-up.   Electronically Signed   By: FMargaretha SheffieldMD   On: 11/14/2020 12:18  CPerry ParkClinician Interpretation: I have personally reviewed the radiological images as listed.  My interpretation, in the context of the patient's clinical presentation, is treatment effect vs true progression   Assessment/Plan Brain Metastases  Leveon SRoosevelt Eimers is clinically stable today.  MRI demonstrates stable findings with regards to brainstem mass; no further progression is appreciated with previously treated R frontal territory.  Tiny new foci of enhancement (2) are appreciated, may represent new metastases or vascular enhancement.  Etiology of now regressed pontine base mass remains either unusual inflammatory process s/p WBRT, or novel metastasis.    We recommended decreasing decadron to 150mdaily.  We ask that JoDevontaye Groundreturn to clinic in 3 months following next brain MRI, or sooner as needed.  We appreciate the opportunity to participate in the care of [place] Marland KitchenHe will also continue to follow up with Dr. MoJulien Nordmannor lung cancer management.  All questions were answered. The patient knows to call the clinic with any problems, questions or concerns. No barriers to learning were detected.  The total time spent in the encounter was 30 minutes and more than 50% was on counseling and review of test results   ZaVentura SellersMD Medical Director of Neuro-Oncology [facility] [place]3/08/22 12:38 PM

## 2020-12-19 ENCOUNTER — Other Ambulatory Visit: Payer: Self-pay

## 2020-12-19 ENCOUNTER — Ambulatory Visit (HOSPITAL_BASED_OUTPATIENT_CLINIC_OR_DEPARTMENT_OTHER)
Admission: RE | Admit: 2020-12-19 | Discharge: 2020-12-19 | Disposition: A | Discharge status: Home / Self Care | Payer: Medicare Other | Source: Ambulatory Visit | Attending: Internal Medicine | Admitting: Internal Medicine

## 2020-12-19 ENCOUNTER — Other Ambulatory Visit: Payer: Self-pay | Admitting: Medical Oncology

## 2020-12-19 ENCOUNTER — Ambulatory Visit (INDEPENDENT_AMBULATORY_CARE_PROVIDER_SITE_OTHER): Payer: Medicare Other | Admitting: Cardiovascular Disease

## 2020-12-19 ENCOUNTER — Emergency Department (HOSPITAL_COMMUNITY)
Admission: EM | Admit: 2020-12-19 | Discharge: 2020-12-19 | Disposition: A | Discharge status: Home / Self Care | Payer: Medicare Other | Attending: Emergency Medicine | Admitting: Emergency Medicine

## 2020-12-19 ENCOUNTER — Encounter (HOSPITAL_COMMUNITY): Payer: Self-pay | Admitting: *Deleted

## 2020-12-19 ENCOUNTER — Telehealth: Payer: Self-pay | Admitting: Medical Oncology

## 2020-12-19 DIAGNOSIS — C3490 Malignant neoplasm of unspecified part of unspecified bronchus or lung: Secondary | ICD-10-CM | POA: Insufficient documentation

## 2020-12-19 DIAGNOSIS — Z79899 Other long term (current) drug therapy: Secondary | ICD-10-CM | POA: Diagnosis not present

## 2020-12-19 DIAGNOSIS — Z8616 Personal history of COVID-19: Secondary | ICD-10-CM | POA: Diagnosis not present

## 2020-12-19 DIAGNOSIS — I2510 Atherosclerotic heart disease of native coronary artery without angina pectoris: Secondary | ICD-10-CM | POA: Insufficient documentation

## 2020-12-19 DIAGNOSIS — Z794 Long term (current) use of insulin: Secondary | ICD-10-CM | POA: Diagnosis not present

## 2020-12-19 DIAGNOSIS — Z85118 Personal history of other malignant neoplasm of bronchus and lung: Secondary | ICD-10-CM | POA: Diagnosis not present

## 2020-12-19 DIAGNOSIS — Z5181 Encounter for therapeutic drug level monitoring: Secondary | ICD-10-CM

## 2020-12-19 DIAGNOSIS — R609 Edema, unspecified: Secondary | ICD-10-CM

## 2020-12-19 DIAGNOSIS — Z7901 Long term (current) use of anticoagulants: Secondary | ICD-10-CM | POA: Insufficient documentation

## 2020-12-19 DIAGNOSIS — E119 Type 2 diabetes mellitus without complications: Secondary | ICD-10-CM | POA: Insufficient documentation

## 2020-12-19 DIAGNOSIS — M7989 Other specified soft tissue disorders: Secondary | ICD-10-CM | POA: Diagnosis present

## 2020-12-19 DIAGNOSIS — I82432 Acute embolism and thrombosis of left popliteal vein: Secondary | ICD-10-CM

## 2020-12-19 DIAGNOSIS — I110 Hypertensive heart disease with heart failure: Secondary | ICD-10-CM | POA: Diagnosis not present

## 2020-12-19 DIAGNOSIS — Z87891 Personal history of nicotine dependence: Secondary | ICD-10-CM | POA: Insufficient documentation

## 2020-12-19 DIAGNOSIS — I5023 Acute on chronic systolic (congestive) heart failure: Secondary | ICD-10-CM | POA: Diagnosis not present

## 2020-12-19 DIAGNOSIS — R238 Other skin changes: Secondary | ICD-10-CM | POA: Insufficient documentation

## 2020-12-19 LAB — POCT INR: INR: 4.6 — AB (ref 2.0–3.0)

## 2020-12-19 MED ORDER — RIVAROXABAN 15 MG PO TABS
15.0000 mg | ORAL_TABLET | Freq: Once | ORAL | Status: DC
  Filled 2020-12-19: qty 1

## 2020-12-19 MED ORDER — RIVAROXABAN (XARELTO) VTE STARTER PACK (15 & 20 MG)
ORAL_TABLET | ORAL | 0 refills | Status: DC
Start: 2020-12-19 — End: 2021-01-22

## 2020-12-19 NOTE — CV Procedure (Signed)
BLE venous duplex completed. Preliminary results given to Diane, RN and relayed to Dr. Julien Nordmann. Patient instructed to go see Dennis [name] at [facility].  Results can be found under chart review under CV PROC. 12/19/2020 2:17 PM Teliyah Royal RVT, RDMS

## 2020-12-19 NOTE — ED Triage Notes (Signed)
Pt had ultrasound today showing blood clots behind his left knee. Pt also reports discoloration in his 4th left toe x 2 months that he has not had evaluated.

## 2020-12-19 NOTE — ED Provider Notes (Signed)
[facility] DEPT Provider Note   CSN: [id number] Arrival date & time: 12/19/20  1608     History Chief Complaint  Patient presents with  . blood clot, left leg  . Leg Swelling    Right leg    Dub Naasir Carreira. is a 60 y.o. male.  The history is provided by the patient and medical records. No language interpreter was used.     60 year old male significant history of lung cancer currently receiving chemo radiation, prior PE, was on warfarin, CAD, CHF presenting with concerns of skin changes to his toe.  Patient report for the past 2 months he noticed that his right second toe is darker than the rest of his toes.  It is not tender to the touch and he denies any associated numbness or loss of sensation to the affected toe.  He denies noticing that his foot is any colder than usual.  He denies any injury to the affected toe.  In January he fell and broke his left femur.  For the past 2 weeks he has been having swelling to his left lower extremity and today he had an outpatient venous Doppler study which shows positive for a DVT in the left popliteal vein.  He has been on Coumadin but was told to discontinue due to his high INR for and he will be transitioning over to Xarelto.  He has not got the medication yet but scheduled to pick it up tomorrow.  He denies any worsening shortness of breath fever or other complaint.  He is here today because he mentioned about his told to the oncology nurse over the phone, who recommend patient to come to ER for further evaluation.   Past Medical History:  Diagnosis Date  . Alcohol abuse with alcohol-induced mood disorder ([place]) 08/18/2015  . Automatic implantable cardioverter-defibrillator in situ 2012   S/P St Jude Dual Chamber. ICD.  Marland Kitchen CAD (coronary artery disease) 11/16/2014   CAD Coronary angiography (12/2008) with mid LAD totally occluded and collateralized.  . Cardiac LV ejection fraction 10-20%   . Chest pain 09/04/2018  .  CHF (congestive heart failure) ([place])   . Chronic systolic heart failure (HCC)    a) Mixed ICM/NICM b) RHC (05/2014): RA 2, RV 19/2/3, PA 22/14 (18), PCWP 6, Fick CO/CI: 5.2 / 2.7, PVR 2.3 WU, PA 60% and 64% c) ECHO (05/2014): EF 20-25%, diff HK, akinesis entireanteroseptal myocardium, triv AI, mod MR, LA mod/sev dilated  . Cocaine abuse ([place]) 01/24/2015  . Cocaine abuse with cocaine-induced mood disorder ([place]) 08/20/2015  . Drug abuse and dependence ([place])   . Dysphagia following cerebral infarction 01/24/2015  . Embolic stroke involving right middle cerebral artery ([place])   . Extensive stage primary small cell carcinoma of lung ([place]) 01/04/2020  . H/O noncompliance with medical treatment, presenting hazards to health 01/24/2015  . Heart murmur   . High cholesterol   . Ischemic cardiomyopathy    a) Coronary angiography (12/2008) at [facility]: Lmain: nl, LAD mid 100% stenosis with left to left and right-to-left collaterals to the distal LAD; Lcx: nl, RCA nl.    . 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[place])   . MDD (major depressive disorder), recurrent severe, without psychosis ([place]) 08/18/2015  . Open wound of foot 02/27/2015  . Pneumonia 1988  . Psychoactive substance-induced mood disorder ([place]) 07/17/2015  . Sleep apnea    "cleared after T&A"  . Status post PICC central line placement   . Stroke ([place])   .  Substance induced mood disorder ([place]) 08/17/2015  . Type II diabetes mellitus [facility])     Patient Active Problem List   Diagnosis Date Noted  . Femoral distal fracture ([place]) 10/30/2020  . Fall 10/30/2020  . COVID-19 virus infection 10/30/2020  . Brain metastases ([place]) 08/27/2020  . Malignant neoplasm of lung ([place])   . Orthostatic hypotension   . Syncope 07/09/2020  . Slurred speech 07/08/2020  . Uncontrolled type 2 diabetes mellitus with hyperglycemia, with long-term current use of insulin ([place]) 07/08/2020  . Unsteady gait when walking 07/07/2020  . Small cell lung cancer, right upper lobe ([place])  01/19/2020  . Encounter for antineoplastic chemotherapy 01/19/2020  . Encounter for antineoplastic immunotherapy 01/19/2020  . Extensive stage primary small cell carcinoma of lung ([place]) 01/04/2020  . Goals of care, counseling/discussion 01/04/2020  . Mediastinal adenopathy   . Mass of upper lobe of right lung   . Brain mass 12/23/2019  . Vasogenic cerebral edema ([place]) 12/23/2019  . Left leg paresthesias   . History of CVA with residual deficit   . Acute on chronic systolic congestive heart failure ([place])   . Left-sided weakness   . 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[place])   . Cocaine abuse ([place]) 01/24/2015  . Essential hypertension 01/24/2015  . Dysphagia 01/24/2015  . Left hemiplegia ([place]) 01/24/2015  . Hyperlipidemia associated with type 2 diabetes mellitus ([place]) 11/30/2014  . Chronic systolic heart failure ([place]) 06/20/2014  . Automatic implantable cardioverter-defibrillator in situ   . Long term (current) use of anticoagulants 08/09/2013  . Drug abuse and dependence ([place])   . Cardiomyopathy, ischemic 07/21/2011  . Type II diabetes mellitus with ophthalmic manifestations ([place]) 09/13/2007    Past Surgical History:  Procedure Laterality Date  . BRONCHIAL BRUSHINGS  12/28/2019   Procedure: BRONCHIAL BRUSHINGS;  Surgeon: Garner Nash, DO;  Location: [facility];  Service: Thoracic;;  . BRONCHIAL NEEDLE ASPIRATION BIOPSY  12/28/2019   Procedure: BRONCHIAL NEEDLE ASPIRATION BIOPSIES;  Surgeon: Garner Nash, DO;  Location: [facility];  Service: Thoracic;;  . BRONCHIAL WASHINGS  12/28/2019   Procedure: BRONCHIAL WASHINGS;  Surgeon: Garner Nash, DO;  Location: [place];  Service: Thoracic;;  . CARDIAC CATHETERIZATION  05/2014  . CARDIAC DEFIBRILLATOR PLACEMENT  03/2011  . CHOLECYSTECTOMY  1994  . FEMUR IM NAIL Left 10/31/2020   Procedure: INTRAMEDULLARY (IM) RETROGRADE FEMORAL NAILING;  Surgeon: Shona Needles, MD;  Location: [facility];  Service: Orthopedics;  Laterality:  Left;  . FOREARM FRACTURE SURGERY Left 1980  . FRACTURE SURGERY    . IR IMAGING GUIDED PORT INSERTION  10/11/2020  . RIGHT HEART CATHETERIZATION N/A 05/30/2014   Procedure: RIGHT HEART CATH;  Surgeon: Jolaine Artist, MD;  Location[facility] CATH LAB;  Service: Cardiovascular;  Laterality: N/A;  . RIGHT HEART CATHETERIZATION N/A 02/07/2015   Procedure: RIGHT HEART CATH;  Surgeon: Jolaine Artist, MD;  Location: [facility] CATH LAB;  Service: Cardiovascular;  Laterality: N/A;  . TONSILLECTOMY AND ADENOIDECTOMY  ~ 1998  . VIDEO BRONCHOSCOPY WITH ENDOBRONCHIAL ULTRASOUND N/A 12/28/2019   Procedure: VIDEO BRONCHOSCOPY WITH ENDOBRONCHIAL ULTRASOUND;  Surgeon: Garner Nash, DO;  Location: [facility];  Service: Thoracic;  Laterality: N/A;       Family History  Problem Relation Age of Onset  . Diabetes Mother   . Heart disease Mother   . Diabetes Father   . Prostate cancer Father   . Heart disease Father   . Diabetes Brother   . Diabetes Brother  Social History   Tobacco Use  . Smoking status: Former Smoker    Packs/day: 0.25    Years: 31.00    Pack years: 7.75    Types: Cigarettes  . Smokeless tobacco: Never Used  Vaping Use  . Vaping Use: Never used  Substance Use Topics  . Alcohol use: Not Currently    Alcohol/week: 0.0 standard drinks    Comment: 09/21/2014 "might have a drink q 6 /months, if that"  . Drug use: Yes    Types: Cocaine    Comment: last use 07/06/2020    Home Medications Prior to Admission medications   Medication Sig Start Date End Date Taking? Authorizing Provider  Accu-Chek Softclix Lancets lancets  02/03/20   [provider]  atorvastatin (LIPITOR) 20 MG tablet TAKE 1 TABLET BY MOUTH DAILY AT 6 PM. Patient not taking: No sig reported 03/11/17   Bensimhon, Shaune Pascal, MD  Blood Glucose Monitoring Suppl (FIFTY50 GLUCOSE METER 2.0) w/Device KIT Use as instructed 02/03/20   [provider]  dexamethasone (DECADRON) 1 MG tablet Take 1 tablet (1  mg total) by mouth daily. 12/18/20   Ventura Sellers, MD  feeding supplement (ENSURE ENLIVE / ENSURE PLUS) LIQD Take 237 mLs by mouth 2 (two) times daily between meals. 07/26/20   Eugenie Filler, MD  furosemide (LASIX) 20 MG tablet Take 1 tablet (20 mg total) by mouth daily. 11/28/20 12/28/20  Barb Merino, MD  HYDROcodone-acetaminophen (NORCO/VICODIN) 5-325 MG tablet Take 1 tablet by mouth every 6 (six) hours as needed for severe pain. 11/28/20   Barb Merino, MD  insulin glargine (LANTUS SOLOSTAR) 100 UNIT/ML Solostar Pen Inject 25 Units into the skin 2 (two) times daily. Patient taking differently: Inject 25 Units into the skin in the morning and at bedtime. 07/25/20   Eugenie Filler, MD  insulin lispro (HUMALOG KWIKPEN) 100 UNIT/ML KwikPen Inject 6 Units into the skin 3 (three) times daily. Patient taking differently: Inject 6 Units into the skin 3 (three) times daily with meals. 07/25/20   Eugenie Filler, MD  INSUPEN ULTRAFIN 31G X 8 MM [place]  03/02/20   [provider]  lidocaine-prilocaine (EMLA) cream Apply 1 application topically as needed. Patient taking differently: Apply 1 application topically as needed (as directed). 09/24/20   Heilingoetter, Cassandra L, PA-C  methocarbamol (ROBAXIN) 500 MG tablet Take 1 tablet (500 mg total) by mouth every 6 (six) hours as needed for muscle spasms. 11/04/20   Bonnielee Haff, MD  midodrine (PROAMATINE) 10 MG tablet Take 1 tablet (10 mg total) by mouth 3 (three) times daily with meals. 07/25/20   Eugenie Filler, MD  Multiple Vitamin (MULTIVITAMIN WITH MINERALS) TABS tablet Take 1 tablet by mouth daily. 11/05/20   Bonnielee Haff, MD  potassium chloride (KLOR-CON) 10 MEQ tablet Take 1 tablet (10 mEq total) by mouth daily. 11/28/20 02/26/21  Barb Merino, MD  prochlorperazine (COMPAZINE) 10 MG tablet Take 10 mg by mouth every 6 (six) hours as needed for nausea or vomiting.    [provider]  RIVAROXABAN Alveda Reasons) VTE  STARTER PACK (15 & 20 MG) Follow package directions: Take one 92m tablet by mouth twice a day. On day 22, switch to one 223mtablet once a day. Take with food. 12/19/20   MoCurt BearsMD  senna (SENOKOT) 8.6 MG TABS tablet Take 1 tablet (8.6 mg total) by mouth 2 (two) times daily. 11/04/20   KrBonnielee HaffMD  sennosides-docusate sodium (SENOKOT-S) 8.6-50 MG tablet Take 1 tablet  by mouth daily as needed for constipation.    [provider]  sodium chloride 1 g tablet Take 1 tablet (1 g total) by mouth in the morning. 11/04/20   Bonnielee Haff, MD  tamsulosin (FLOMAX) 0.4 MG CAPS capsule Take 0.4 mg by mouth daily. 10/22/20   [provider]  Vitamin D3 (VITAMIN D) 25 MCG tablet Take 2 tablets (2,000 Units total) by mouth 2 (two) times daily. 11/02/20   Delray Alt, PA-C    Allergies    Patient has no known allergies.  Review of Systems   Review of Systems  All other systems reviewed and are negative.   Physical Exam Updated Vital Signs BP (!) 120/93 (BP Location: Right Arm)   Pulse 98   Temp (!) 97.4 F (36.3 C) (Oral)   Resp 18   SpO2 93%   Physical Exam Vitals and nursing note reviewed.  Constitutional:      General: He is not in acute distress.    Appearance: He is well-developed and well-nourished.  HENT:     Head: Atraumatic.  Eyes:     Conjunctiva/sclera: Conjunctivae normal.  Musculoskeletal:     Cervical back: Neck supple.     Right lower leg: Edema present.     Left lower leg: Edema present.     Comments: 2+ pitting edema to bilateral extremity extending towards the knees  Skin:    Findings: No rash.     Comments: Right foot: Darkened dry skin noted to the dorsum of fourth toe and a portion of the fifth toe with brisk cap refill nontender to palpation no deformity and no signs of infection.  Neurological:     Mental Status: He is alert.  Psychiatric:        Mood and Affect: Mood and affect normal.     ED Results / Procedures /  Treatments   Labs (all labs ordered are listed, but only abnormal results are displayed) Labs Reviewed - No data to display  EKG None  Radiology VAS [place] LOWER EXTREMITY VENOUS (DVT)  Result Date: 12/19/2020  Lower Venous DVT Study Indications: LLE swelling.  Risk Factors: Cancer Lung CA. Comparison Study: Previous 05/15/2014 - negative Performing Technologist: Rogelia Rohrer  Examination Guidelines: A complete evaluation includes B-mode imaging, spectral Doppler, color Doppler, and power Doppler as needed of all accessible portions of each vessel. Bilateral testing is considered an integral part of a complete examination. Limited examinations for reoccurring indications may be performed as noted. The reflux portion of the exam is performed with the patient in reverse Trendelenburg.  +---------+---------------+---------+-----------+----------+--------------+ RIGHT    CompressibilityPhasicitySpontaneityPropertiesThrombus Aging +---------+---------------+---------+-----------+----------+--------------+ CFV      Full           Yes      Yes                                 +---------+---------------+---------+-----------+----------+--------------+ SFJ      Full                                                        +---------+---------------+---------+-----------+----------+--------------+ FV Prox  Full           Yes      Yes                                 +---------+---------------+---------+-----------+----------+--------------+  FV Mid   Full           Yes      Yes                                 +---------+---------------+---------+-----------+----------+--------------+ FV DistalFull           Yes      Yes                                 +---------+---------------+---------+-----------+----------+--------------+ PFV      Full                                                        +---------+---------------+---------+-----------+----------+--------------+ POP       Full           Yes      Yes                                 +---------+---------------+---------+-----------+----------+--------------+ PTV      Full                                                        +---------+---------------+---------+-----------+----------+--------------+ PERO     Full                                                        +---------+---------------+---------+-----------+----------+--------------+   +---------+---------------+---------+-----------+----------+-----------------+ LEFT     CompressibilityPhasicitySpontaneityPropertiesThrombus Aging    +---------+---------------+---------+-----------+----------+-----------------+ CFV      Full           Yes      Yes                                    +---------+---------------+---------+-----------+----------+-----------------+ SFJ      Full                                                           +---------+---------------+---------+-----------+----------+-----------------+ FV Prox  Full           Yes      Yes                                    +---------+---------------+---------+-----------+----------+-----------------+ FV Mid   Full           Yes      Yes                                    +---------+---------------+---------+-----------+----------+-----------------+  FV DistalFull           Yes      Yes                                    +---------+---------------+---------+-----------+----------+-----------------+ PFV      Full                                                           +---------+---------------+---------+-----------+----------+-----------------+ POP      Partial        Yes      Yes                  Age Indeterminate +---------+---------------+---------+-----------+----------+-----------------+ PTV      Full                                                           +---------+---------------+---------+-----------+----------+-----------------+  PERO     Full                                                           +---------+---------------+---------+-----------+----------+-----------------+     Summary: BILATERAL: -No evidence of popliteal cyst, bilaterally. RIGHT: - There is no evidence of deep vein thrombosis in the lower extremity. - There is no evidence of superficial venous thrombosis.  LEFT: - Findings consistent with age indeterminate deep vein thrombosis involving the left popliteal vein. - There is no evidence of superficial venous thrombosis.  *See table(s) above for measurements and observations.    Preliminary     Procedures Procedures   Medications Ordered in ED Medications  Rivaroxaban (XARELTO) tablet 15 mg (has no administration in time range)    ED Course  I have reviewed the triage vital signs and the nursing notes.  Pertinent labs & imaging results that were available during my care of the patient were reviewed by me and considered in my medical decision making (see chart for details).    MDM Rules/Calculators/A&P                          BP (!) 117/99   Pulse (!) 103   Temp (!) 97.4 F (36.3 C) (Oral)   Resp (!) 34   SpO2 97%   Final Clinical Impression(s) / ED Diagnoses Final diagnoses:  Peripheral edema    Rx / DC Orders ED Discharge Orders    None     4:46 PM Patient has had swelling to his left lower extremity for approximately 2 weeks.  Had a venous Doppler study today that demonstrate an age indeterminant left DVT at the popliteal region.  Patient has been on warfarin and today, was found to be supratherapeutic with an INR of 4.  Per oncology note, he is to discontinue warfarin and transition to Xarelto as treatment of DVT.  He report he will get this  medication tomorrow therefore I will give an initial Xarelto dose here in the ER.  His other concern was darkened skin to his right fourth toe for approximately 2 months.  He denies any pain any injury any numbness or coldness to his  foot.  On exam he does have some dry document skin noted to the dorsum of the fourth and partial portion of the fifth toes that I can scrape off.  He has brisk cap refill.  It does not appear to be an ischemic toe and no signs of injury.  Does have intact pedal pulses along with peripheral edema.  I discussed care with Dr. Karle Starch who have also evaluated pt.  Felt skin changes likely 2/2 friction rub to shoes due to peripheral edema.  Pt has intact distsal pulses, no evidence of ischemic toe.  He is stable for discharge.  Recommend elevating legs.  Also to start taking Xarelto in 3 days and discontinue coumadin.    Domenic Moras, PA-C 12/19/20 1829    Truddie Hidden, MD 12/19/20 [phone]

## 2020-12-19 NOTE — Telephone Encounter (Signed)
Jody reported pt has + DVT involving  left popliteal vein. Pt notified to come  To Dr Worthy Flank office for instructions.

## 2020-12-19 NOTE — Discharge Instructions (Signed)
You have been evaluated for your skin changes on your toes.  This is likely due to friction rub from your shoes due to increase leg swelling.  Keep legs elevated at rest.  Wear looser shoes.  You have been diagnosed with a blood clot in your left leg.  Stop your coumadin and start taking Xarelto on Saturday as previously prescribed.  Return if you have any concerns.

## 2020-12-19 NOTE — Telephone Encounter (Signed)
Called report -DVT is positive in left popliteal vein. RLL negative. Pt has been on coumadin and is holding it today and tomorrow  for his high INR.  I instructed vascular lab to send pt to cancer center.   Pt presented to lobby with brother [name]. DVT results given to pt and his brother.  New xarelto-I told him Julien Nordmann is sending a new prescription for a blood thinner , Xarelto ,   to his pharmacy. He was instructed to start it on Sat due to INR result today. [place] was told to discontinue Coumadin.  New right foot swelling=Pt is also concerned about his new right foot swelling and right little toe " turned dark" last night. Edema present and R little toe is darker than others , feet /toes warm.   Per Dr Julien Nordmann ,he was instructed to elevate his legs at home and to go to ED if symptoms worsen and /or he has acute chest pain, SOB.   Per Dr Julien Nordmann I messaged this information to  Dr Wonda Cerise.

## 2020-12-19 NOTE — Patient Instructions (Addendum)
Description   Spoke to Jana Half from [facility], instructed to have pt hold warfarin today and tomorrow, then continue on same dosage of Warfarin 1/2 tablet daily except 1 tablet on Tuesdays and Thursdays. Recheck in 1 week by [facility].  Main # [phone] Coumadin Clinic # [phone].

## 2020-12-20 ENCOUNTER — Telehealth: Payer: Self-pay | Admitting: Internal Medicine

## 2020-12-20 NOTE — Telephone Encounter (Signed)
Scheduled per los. Called and left msg.

## 2020-12-24 ENCOUNTER — Ambulatory Visit (HOSPITAL_COMMUNITY)
Admission: RE | Admit: 2020-12-24 | Discharge: 2020-12-24 | Disposition: A | Discharge status: Home / Self Care | Payer: Medicare Other | Source: Ambulatory Visit | Attending: Internal Medicine | Admitting: Internal Medicine

## 2020-12-24 ENCOUNTER — Other Ambulatory Visit: Payer: Self-pay

## 2020-12-24 DIAGNOSIS — I110 Hypertensive heart disease with heart failure: Secondary | ICD-10-CM | POA: Diagnosis not present

## 2020-12-24 DIAGNOSIS — C3490 Malignant neoplasm of unspecified part of unspecified bronchus or lung: Secondary | ICD-10-CM | POA: Insufficient documentation

## 2020-12-24 DIAGNOSIS — I509 Heart failure, unspecified: Secondary | ICD-10-CM | POA: Diagnosis not present

## 2020-12-24 IMAGING — CT CT CHEST W/ CM
2 of 5 series · 13 of 36 positions shown, 16 images · IV contrast (omnipaque)
Comparison: [DATE]

CLINICAL DATA: Metastatic small-cell lung cancer restaging, known
brain metastatic disease

EXAM:
CT CHEST, ABDOMEN, AND PELVIS WITH CONTRAST
TECHNIQUE: Multidetector CT imaging of the chest, abdomen and pelvis was
performed following the standard protocol during bolus
administration of intravenous contrast.
CONTRAST:  100mL OMNIPAQUE IOHEXOL 300 MG/ML SOLN, additional oral
enteric contrast

[Series 2: cap with · axial · 0.71mm/px · z∈[-641,-81]mm · 10 of 138 slices shown, 13 images]
[im 13/138  mediastinal]
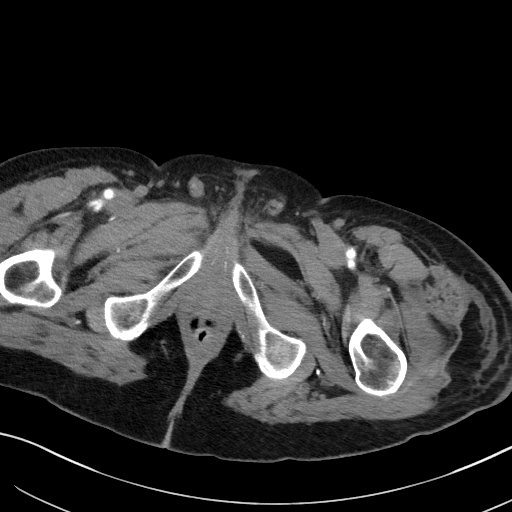
[im 13/138  lung]
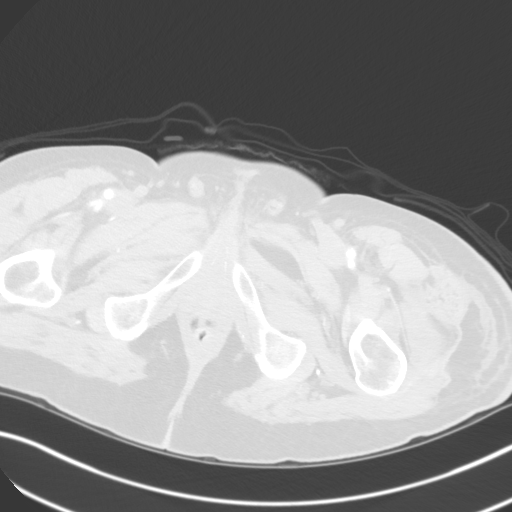
[im 25/138  lung]
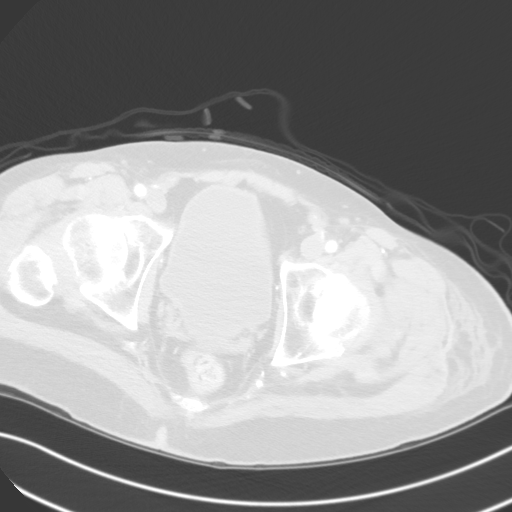
[im 38/138  lung]
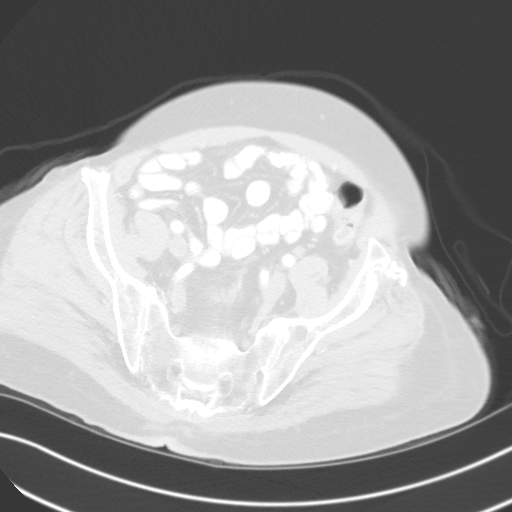
[im 50/138  lung]
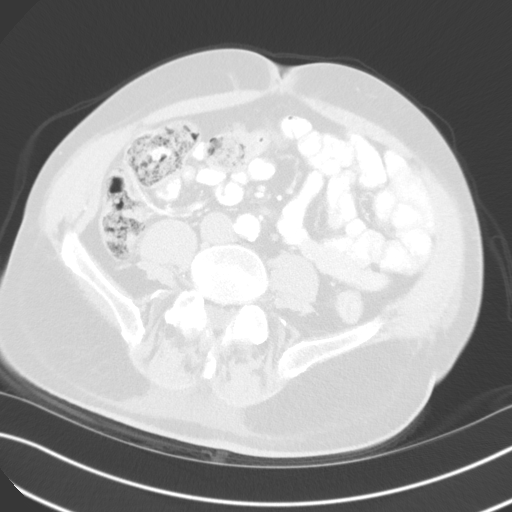
[im 63/138  mediastinal]
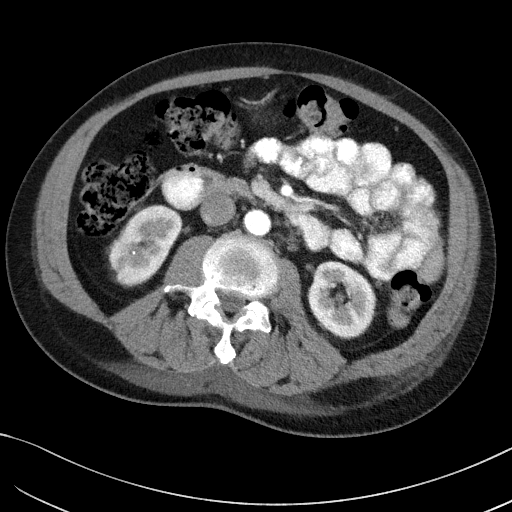
[im 63/138  lung]
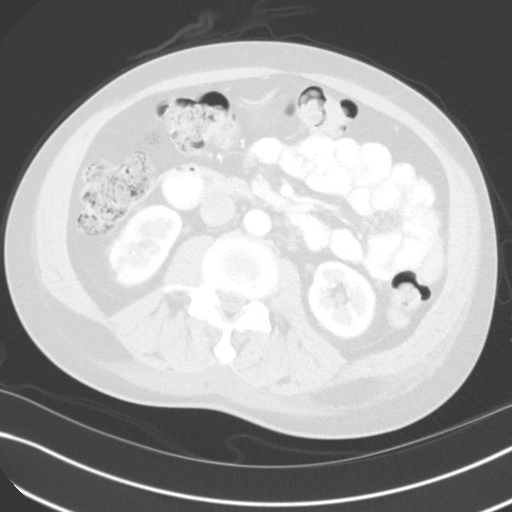
[im 75/138  lung]
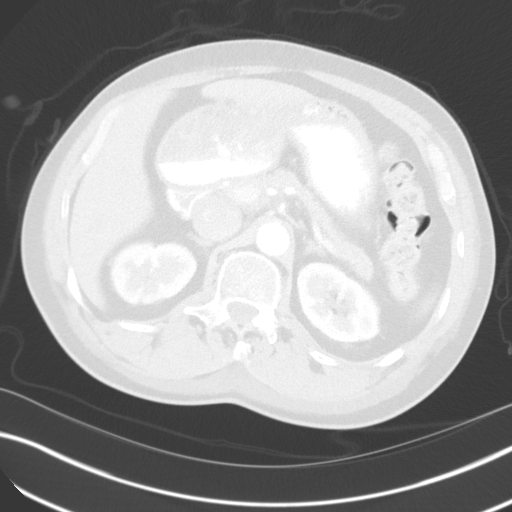
[im 88/138  lung]
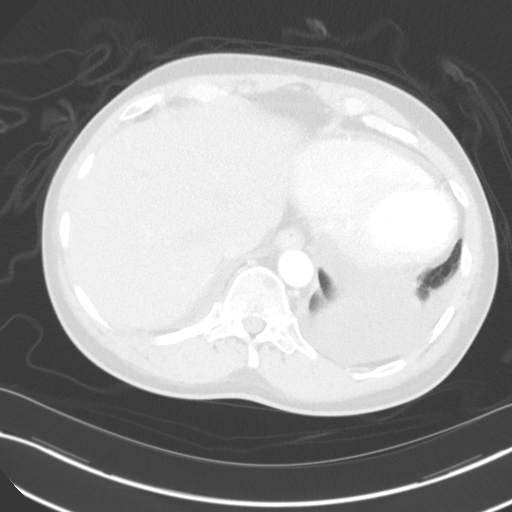
[im 100/138  lung]
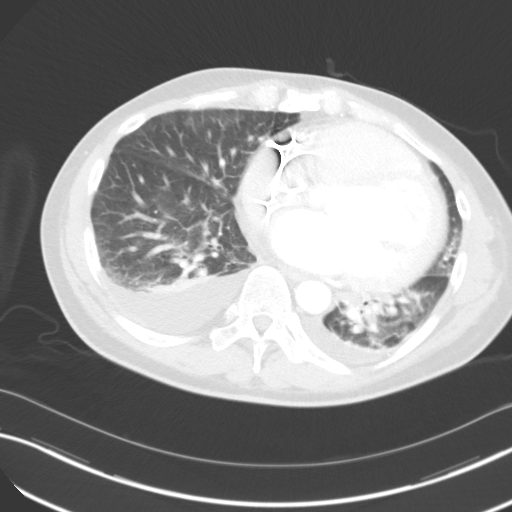
[im 113/138  mediastinal]
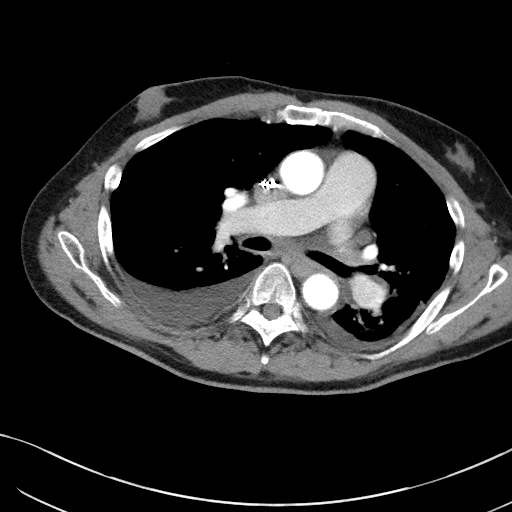
[im 113/138  lung]
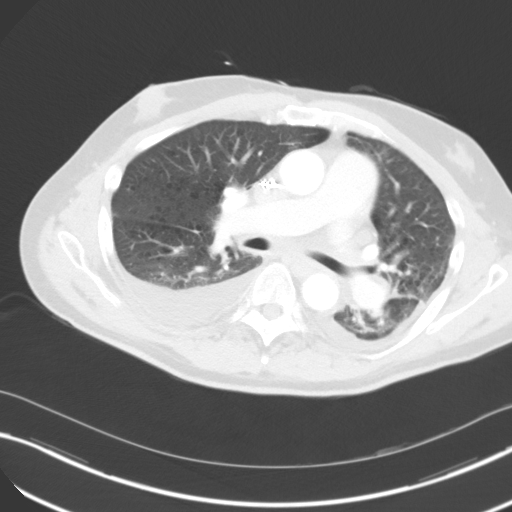
[im 125/138  lung]
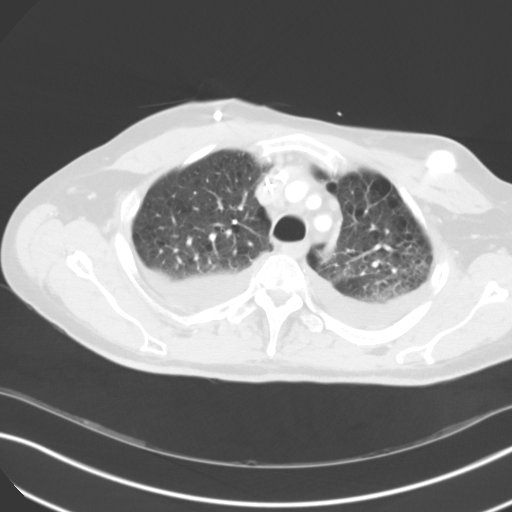

[Series 5: coronals · coronal · 0.81mm/px · 3 of 145 slices shown]
[im 29/145  lung]
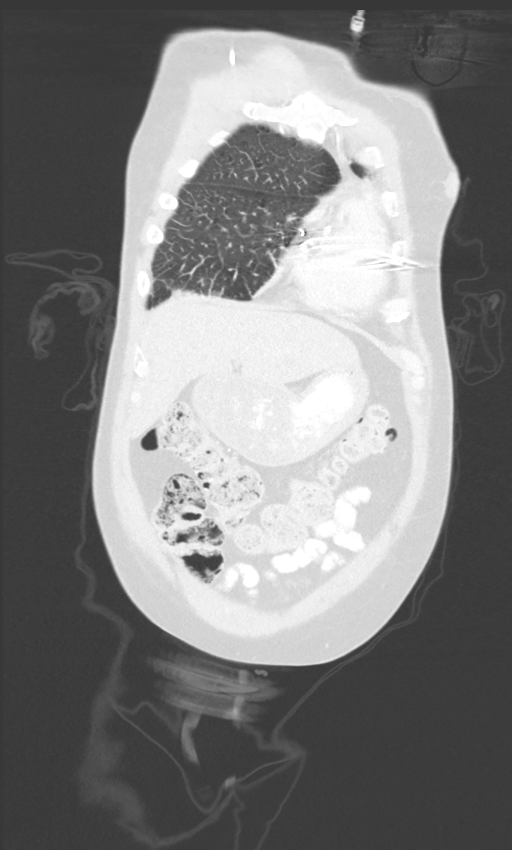
[im 58/145  lung]
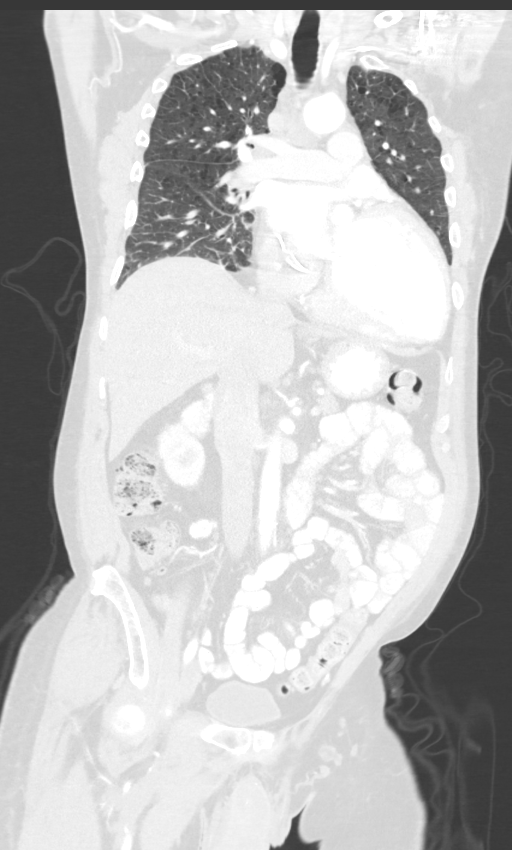
[im 87/145  lung]
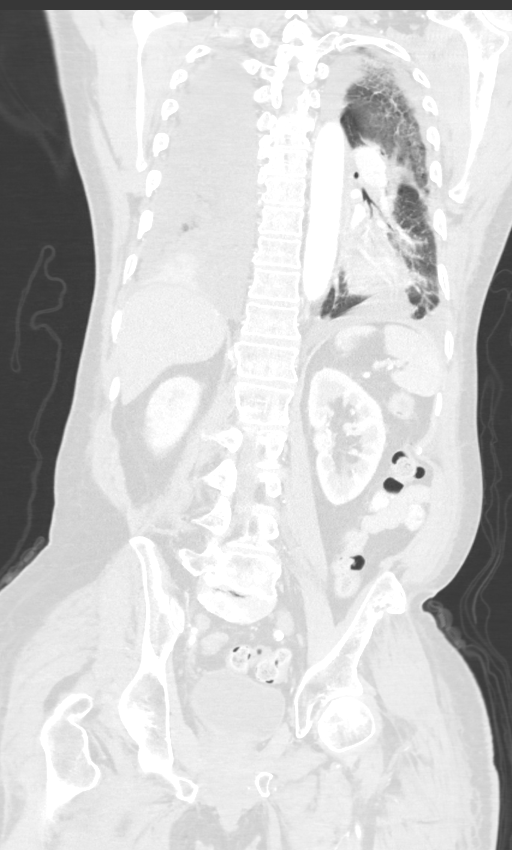

[13 of 36 positions shown; findings below may reference images not displayed]

FINDINGS: CT CHEST FINDINGS

Cardiovascular: Right chest port catheter. Aortic atherosclerosis.
Cardiomegaly. Three-vessel coronary artery calcifications. No
pericardial effusion.

Mediastinum/Nodes: Newly enlarged pretracheal and subcarinal lymph
nodes, largest pretracheal nodes measuring 2.4 x 1.7 cm (series 2,
image 22). Thyroid gland, trachea, and esophagus demonstrate no
significant findings.

Lungs/Pleura: New small bilateral pleural effusions with associated
atelectasis or consolidation. Moderate centrilobular emphysema.
Interval enlargement of a spiculated nodule of the posterolateral
segment of the right middle lobe, abutting the major fissure,
measuring 1.6 x 1.0 cm, previously 1.1 x 0.7 cm when measured
similarly (series 4, image 90). Is diffuse bilateral interlobular
septal thickening. Diffuse bilateral bronchial wall thickening.

Musculoskeletal: No chest wall mass or suspicious bone lesions
identified.

CT ABDOMEN PELVIS FINDINGS

Hepatobiliary: No focal liver abnormality is seen. Status post
cholecystectomy. No biliary dilatation.

Pancreas: Unremarkable. No pancreatic ductal dilatation or
surrounding inflammatory changes.

Spleen: Normal in size without significant abnormality.

Adrenals/Urinary Tract: Stable low-attenuation nodule of the right
adrenal gland measuring 2.7 cm, previously non FDG avid and
characterized as a benign adenoma (series 2, image 60). Punctuate
nonobstructive calculus of the inferior pole of the right kidney. No
hydronephrosis bladder is unremarkable.

Stomach/Bowel: Stomach is within normal limits. Appendix appears
normal. No evidence of bowel wall thickening, distention, or
inflammatory changes.

Vascular/Lymphatic: Aortic atherosclerosis. No enlarged abdominal or
pelvic lymph nodes.

Reproductive: No mass or other abnormality.

Other: No abdominal wall hernia or abnormality. No abdominopelvic
ascites.

Musculoskeletal: No acute or significant osseous findings.
IMPRESSION: 1. Interval enlargement of a spiculated nodule of the posterolateral
segment of the right middle lobe, consistent with worsened primary
lung malignancy.
2. Newly enlarged pretracheal and subcarinal lymph nodes, consistent
with nodal metastatic disease.
3. New small bilateral pleural effusions with associated atelectasis
or consolidation. There is diffuse bilateral interlobular septal
thickening. These findings suggest pulmonary edema but lymphangitic
metastatic disease can produce this appearance. No obvious pleural
nodularity identified.
4. No evidence of metastatic disease within the abdomen or pelvis.
5. Emphysema.
6. Coronary artery disease.

Aortic Atherosclerosis ([SX]-[SX]) and Emphysema ([SX]-[SX]).

## 2020-12-24 MED ORDER — HEPARIN SOD (PORK) LOCK FLUSH 100 UNIT/ML IV SOLN
INTRAVENOUS | Status: AC
  Filled 2020-12-24: qty 5

## 2020-12-24 MED ORDER — IOHEXOL 300 MG/ML  SOLN
100.0000 mL | Freq: Once | INTRAMUSCULAR | Status: AC | PRN
  Administered 2020-12-24: 100 mL via INTRAVENOUS

## 2020-12-24 MED ORDER — HEPARIN SOD (PORK) LOCK FLUSH 100 UNIT/ML IV SOLN
500.0000 [IU] | Freq: Once | INTRAVENOUS | Status: AC
  Administered 2020-12-24: 500 [IU] via INTRAVENOUS

## 2020-12-25 ENCOUNTER — Inpatient Hospital Stay (HOSPITAL_COMMUNITY)
Admission: EM | Admit: 2020-12-25 | Discharge: 2020-12-29 | DRG: 291 | Disposition: A | Discharge status: Home Health | Payer: Medicare Other | Attending: Internal Medicine | Admitting: Internal Medicine

## 2020-12-25 ENCOUNTER — Emergency Department (HOSPITAL_COMMUNITY): Payer: Medicare Other

## 2020-12-25 ENCOUNTER — Ambulatory Visit (INDEPENDENT_AMBULATORY_CARE_PROVIDER_SITE_OTHER): Payer: Medicare Other | Admitting: *Deleted

## 2020-12-25 ENCOUNTER — Encounter (HOSPITAL_COMMUNITY): Payer: Self-pay

## 2020-12-25 ENCOUNTER — Other Ambulatory Visit: Payer: Self-pay

## 2020-12-25 ENCOUNTER — Inpatient Hospital Stay (HOSPITAL_BASED_OUTPATIENT_CLINIC_OR_DEPARTMENT_OTHER): Payer: Medicare Other | Admitting: Internal Medicine

## 2020-12-25 VITALS — BP 117/84 | HR 106 | Temp 98.4°F | Resp 32 | Ht 72.0 in | Wt 200.0 lb

## 2020-12-25 DIAGNOSIS — I6930 Unspecified sequelae of cerebral infarction: Secondary | ICD-10-CM

## 2020-12-25 DIAGNOSIS — I69354 Hemiplegia and hemiparesis following cerebral infarction affecting left non-dominant side: Secondary | ICD-10-CM

## 2020-12-25 DIAGNOSIS — Z9049 Acquired absence of other specified parts of digestive tract: Secondary | ICD-10-CM

## 2020-12-25 DIAGNOSIS — Z20822 Contact with and (suspected) exposure to covid-19: Secondary | ICD-10-CM | POA: Diagnosis present

## 2020-12-25 DIAGNOSIS — Z87891 Personal history of nicotine dependence: Secondary | ICD-10-CM

## 2020-12-25 DIAGNOSIS — S7292XD Unspecified fracture of left femur, subsequent encounter for closed fracture with routine healing: Secondary | ICD-10-CM

## 2020-12-25 DIAGNOSIS — E785 Hyperlipidemia, unspecified: Secondary | ICD-10-CM

## 2020-12-25 DIAGNOSIS — I428 Other cardiomyopathies: Secondary | ICD-10-CM | POA: Diagnosis present

## 2020-12-25 DIAGNOSIS — G8194 Hemiplegia, unspecified affecting left nondominant side: Secondary | ICD-10-CM | POA: Diagnosis present

## 2020-12-25 DIAGNOSIS — Z833 Family history of diabetes mellitus: Secondary | ICD-10-CM

## 2020-12-25 DIAGNOSIS — I5022 Chronic systolic (congestive) heart failure: Secondary | ICD-10-CM | POA: Diagnosis not present

## 2020-12-25 DIAGNOSIS — I5023 Acute on chronic systolic (congestive) heart failure: Secondary | ICD-10-CM | POA: Diagnosis present

## 2020-12-25 DIAGNOSIS — E1165 Type 2 diabetes mellitus with hyperglycemia: Secondary | ICD-10-CM

## 2020-12-25 DIAGNOSIS — T148XXA Other injury of unspecified body region, initial encounter: Secondary | ICD-10-CM

## 2020-12-25 DIAGNOSIS — I509 Heart failure, unspecified: Secondary | ICD-10-CM | POA: Insufficient documentation

## 2020-12-25 DIAGNOSIS — Z8616 Personal history of COVID-19: Secondary | ICD-10-CM

## 2020-12-25 DIAGNOSIS — Z95810 Presence of automatic (implantable) cardiac defibrillator: Secondary | ICD-10-CM | POA: Diagnosis present

## 2020-12-25 DIAGNOSIS — J449 Chronic obstructive pulmonary disease, unspecified: Secondary | ICD-10-CM | POA: Diagnosis present

## 2020-12-25 DIAGNOSIS — C3411 Malignant neoplasm of upper lobe, right bronchus or lung: Secondary | ICD-10-CM

## 2020-12-25 DIAGNOSIS — I110 Hypertensive heart disease with heart failure: Principal | ICD-10-CM | POA: Diagnosis present

## 2020-12-25 DIAGNOSIS — I2510 Atherosclerotic heart disease of native coronary artery without angina pectoris: Secondary | ICD-10-CM | POA: Diagnosis present

## 2020-12-25 DIAGNOSIS — N400 Enlarged prostate without lower urinary tract symptoms: Secondary | ICD-10-CM | POA: Diagnosis present

## 2020-12-25 DIAGNOSIS — E11649 Type 2 diabetes mellitus with hypoglycemia without coma: Secondary | ICD-10-CM | POA: Diagnosis not present

## 2020-12-25 DIAGNOSIS — C3490 Malignant neoplasm of unspecified part of unspecified bronchus or lung: Secondary | ICD-10-CM | POA: Diagnosis not present

## 2020-12-25 DIAGNOSIS — E1169 Type 2 diabetes mellitus with other specified complication: Secondary | ICD-10-CM | POA: Diagnosis not present

## 2020-12-25 DIAGNOSIS — Z7901 Long term (current) use of anticoagulants: Secondary | ICD-10-CM

## 2020-12-25 DIAGNOSIS — Z86718 Personal history of other venous thrombosis and embolism: Secondary | ICD-10-CM

## 2020-12-25 DIAGNOSIS — C7931 Secondary malignant neoplasm of brain: Secondary | ICD-10-CM | POA: Diagnosis present

## 2020-12-25 DIAGNOSIS — Z79899 Other long term (current) drug therapy: Secondary | ICD-10-CM

## 2020-12-25 DIAGNOSIS — I951 Orthostatic hypotension: Secondary | ICD-10-CM | POA: Diagnosis present

## 2020-12-25 DIAGNOSIS — Z794 Long term (current) use of insulin: Secondary | ICD-10-CM

## 2020-12-25 DIAGNOSIS — R0602 Shortness of breath: Secondary | ICD-10-CM

## 2020-12-25 DIAGNOSIS — X58XXXD Exposure to other specified factors, subsequent encounter: Secondary | ICD-10-CM | POA: Diagnosis present

## 2020-12-25 DIAGNOSIS — Z8042 Family history of malignant neoplasm of prostate: Secondary | ICD-10-CM

## 2020-12-25 DIAGNOSIS — Z8249 Family history of ischemic heart disease and other diseases of the circulatory system: Secondary | ICD-10-CM

## 2020-12-25 DIAGNOSIS — J9601 Acute respiratory failure with hypoxia: Secondary | ICD-10-CM | POA: Diagnosis present

## 2020-12-25 DIAGNOSIS — I5043 Acute on chronic combined systolic (congestive) and diastolic (congestive) heart failure: Secondary | ICD-10-CM | POA: Diagnosis present

## 2020-12-25 LAB — CBC WITH DIFFERENTIAL/PLATELET
Abs Immature Granulocytes: 0.03 10*3/uL (ref 0.00–0.07)
Basophils Absolute: 0 10*3/uL (ref 0.0–0.1)
Basophils Relative: 0 %
Eosinophils Absolute: 0.1 10*3/uL (ref 0.0–0.5)
Eosinophils Relative: 1 %
HCT: 36.3 % — ABNORMAL LOW (ref 39.0–52.0)
Hemoglobin: 10.9 g/dL — ABNORMAL LOW (ref 13.0–17.0)
Immature Granulocytes: 0 %
Lymphocytes Relative: 13 %
Lymphs Abs: 1.3 10*3/uL (ref 0.7–4.0)
MCH: 30.5 pg (ref 26.0–34.0)
MCHC: 30 g/dL (ref 30.0–36.0)
MCV: 101.7 fL — ABNORMAL HIGH (ref 80.0–100.0)
Monocytes Absolute: 0.7 10*3/uL (ref 0.1–1.0)
Monocytes Relative: 7 %
Neutro Abs: 8 10*3/uL — ABNORMAL HIGH (ref 1.7–7.7)
Neutrophils Relative %: 79 %
Platelets: 212 10*3/uL (ref 150–400)
RBC: 3.57 MIL/uL — ABNORMAL LOW (ref 4.22–5.81)
RDW: 17.4 % — ABNORMAL HIGH (ref 11.5–15.5)
WBC: 10.2 10*3/uL (ref 4.0–10.5)
nRBC: 0.5 % — ABNORMAL HIGH (ref 0.0–0.2)

## 2020-12-25 LAB — COMPREHENSIVE METABOLIC PANEL
ALT: 67 U/L — ABNORMAL HIGH (ref 0–44)
AST: 31 U/L (ref 15–41)
Albumin: 3 g/dL — ABNORMAL LOW (ref 3.5–5.0)
Alkaline Phosphatase: 163 U/L — ABNORMAL HIGH (ref 38–126)
Anion gap: 7 (ref 5–15)
BUN: 22 mg/dL — ABNORMAL HIGH (ref 6–20)
CO2: 27 mmol/L (ref 22–32)
Calcium: 8.6 mg/dL — ABNORMAL LOW (ref 8.9–10.3)
Chloride: 109 mmol/L (ref 98–111)
Creatinine, Ser: 1.23 mg/dL (ref 0.61–1.24)
GFR, Estimated: >60 mL/min (ref >60)
Glucose, Bld: 103 mg/dL — ABNORMAL HIGH (ref 70–99)
Potassium: 3.5 mmol/L (ref 3.5–5.1)
Sodium: 143 mmol/L (ref 135–145)
Total Bilirubin: 1.1 mg/dL (ref 0.3–1.2)
Total Protein: 6 g/dL — ABNORMAL LOW (ref 6.5–8.1)

## 2020-12-25 LAB — GLUCOSE, CAPILLARY
Glucose-Capillary: 67 mg/dL — ABNORMAL LOW (ref 70–99)
Glucose-Capillary: 146 mg/dL — ABNORMAL HIGH (ref 70–99)

## 2020-12-25 LAB — MAGNESIUM: Magnesium: 1.7 mg/dL (ref 1.7–2.4)

## 2020-12-25 LAB — POCT INR: INR: 2 (ref 2.0–3.0)

## 2020-12-25 LAB — BRAIN NATRIURETIC PEPTIDE: B Natriuretic Peptide: 1073.9 pg/mL — ABNORMAL HIGH (ref 0.0–100.0)

## 2020-12-25 IMAGING — DX DG CHEST 1V PORT
2 series · 3 of 3 positions shown · non-contrast
Comparison: CT chest [DATE].

CLINICAL DATA: Shortness of breath.

EXAM:
PORTABLE CHEST 1 VIEW

[chest ap (1 of 2)]
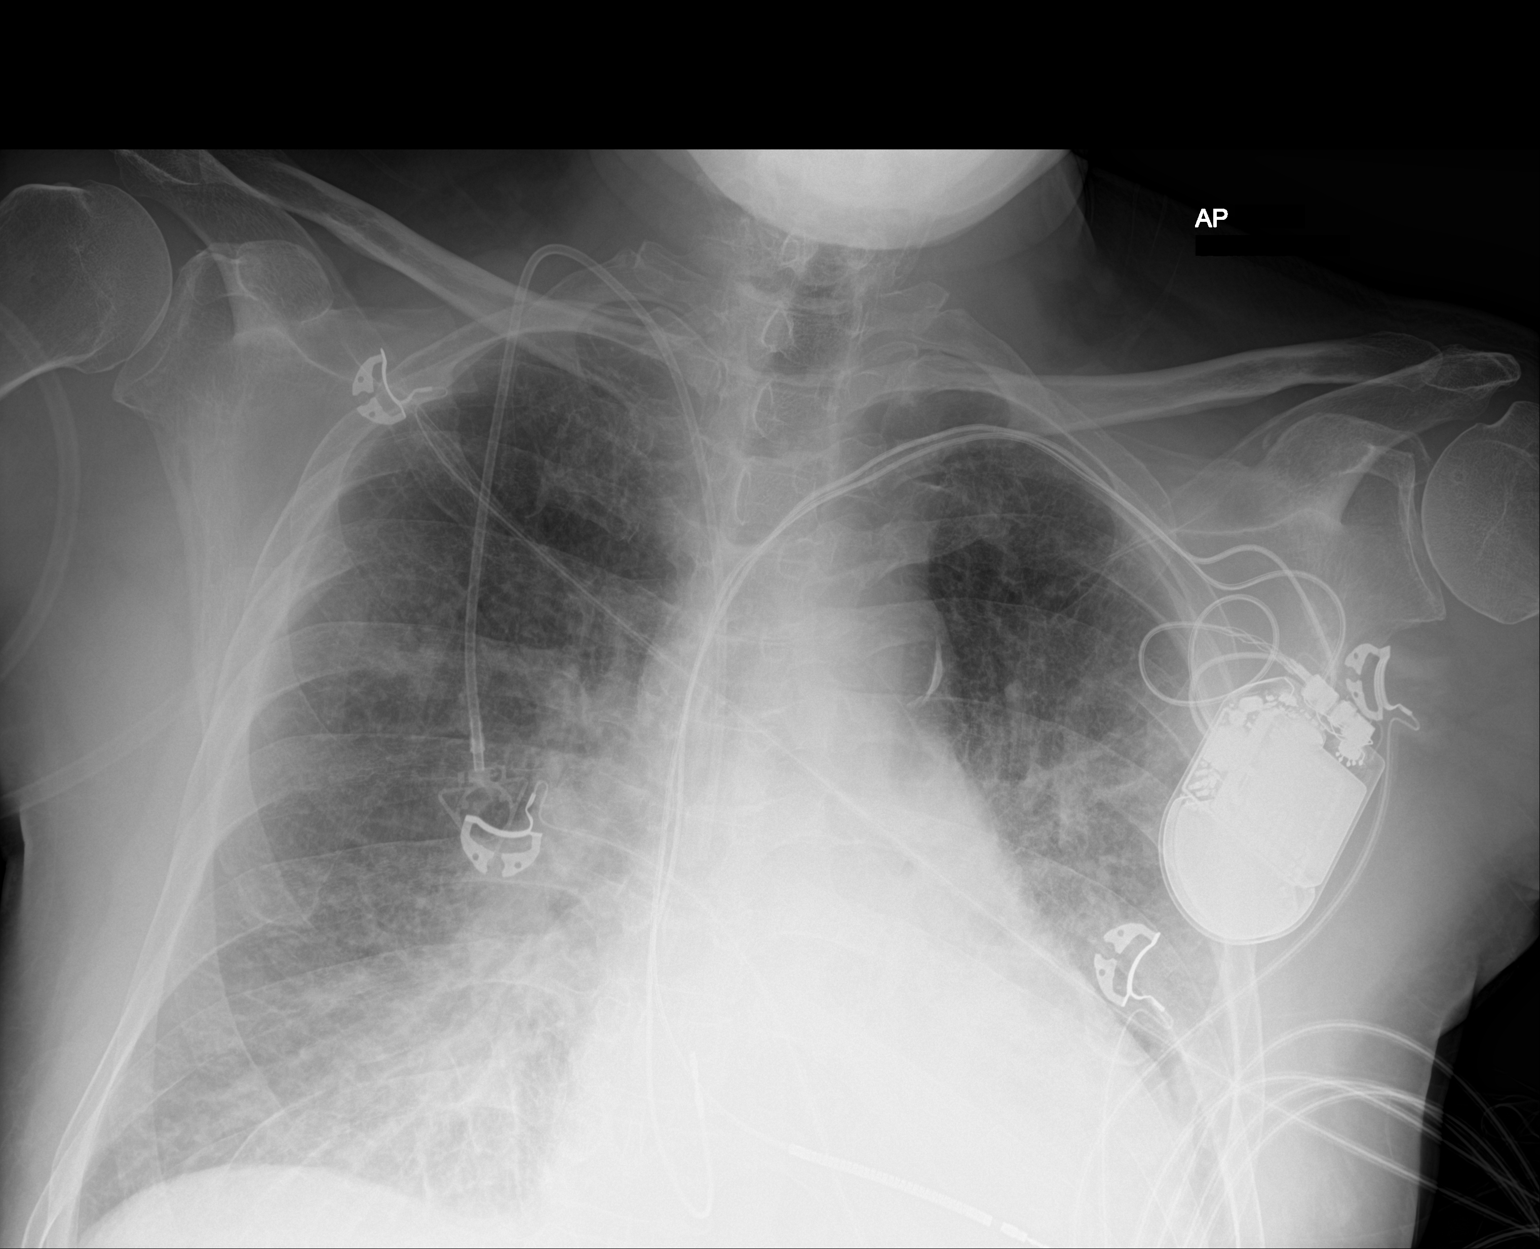

[Series 2: chest ap · 0.14mm/px · 2 of 2 slices shown (2 of 2)]
[im 1/2]
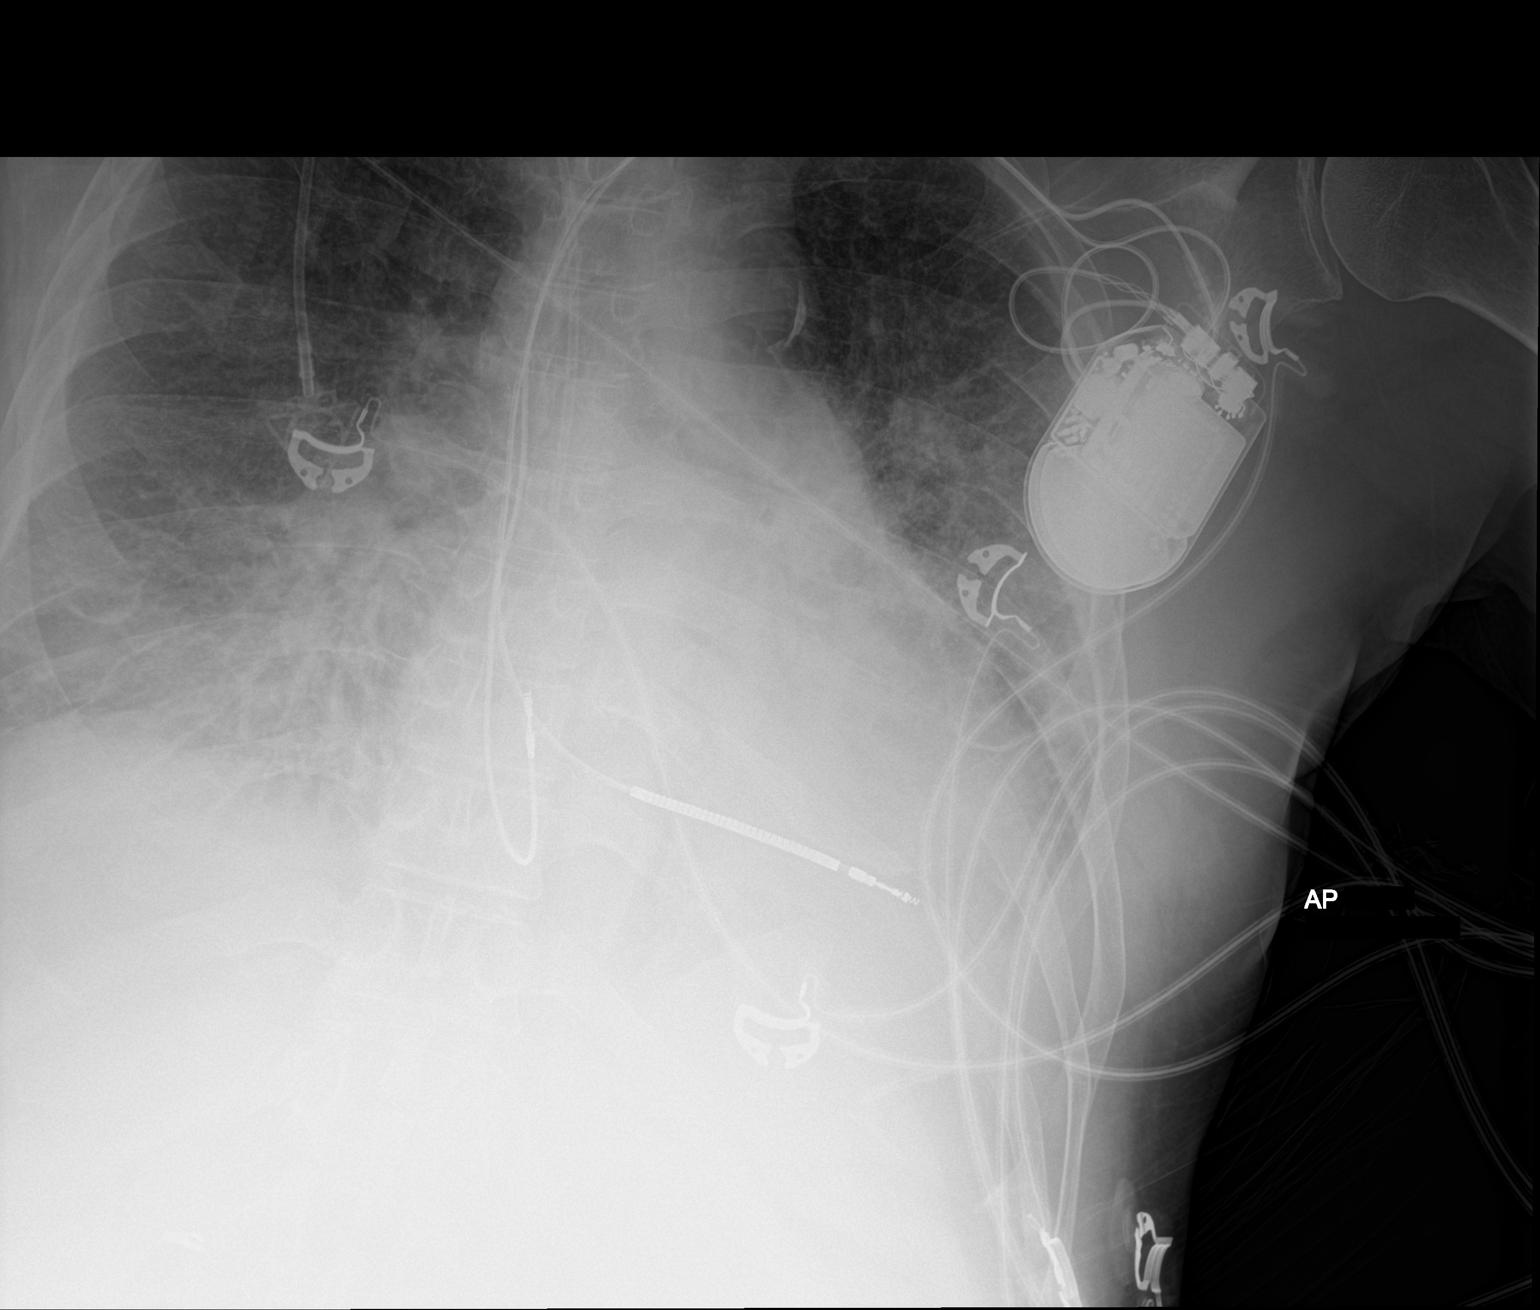
[im 2/2]
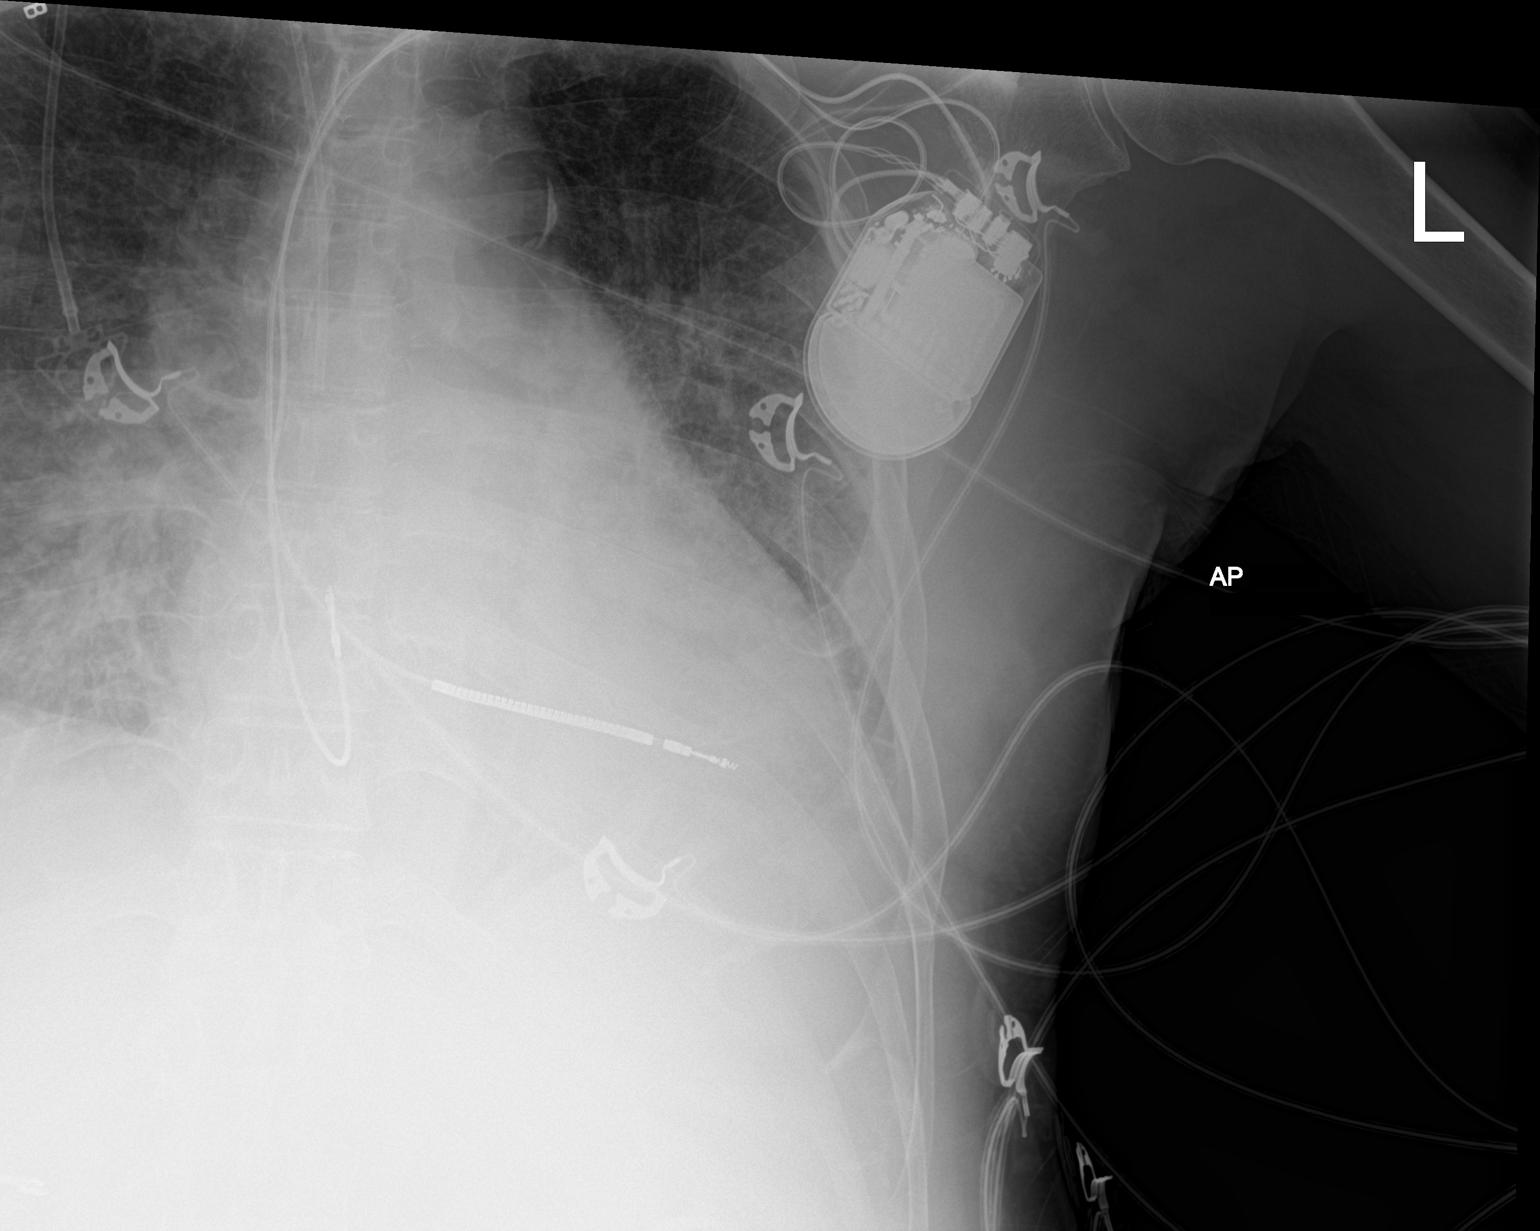

[3 of 3 positions shown; findings below may reference images not displayed]

FINDINGS: Right chest wall port a catheter tip is at the cavoatrial junction.
There is a left chest wall pacer device with leads in the right
atrial appendage and right ventricle. Aortic atherosclerosis.
Diffusely increased interstitial markings identified compatible with
pulmonary edema. Small pleural effusions with veil like
opacification of the lower lobes identified. Left lung base airspace
disease. Chronic interstitial changes of COPD/emphysema.
IMPRESSION: 1. Persistent diffuse increase interstitial markings compatible with
interstitial edema. Persistent pleural effusions with veil like
opacification of the lower lung zones.
2. Asymmetric airspace opacification within the left lower lobe
which may reflect pneumonia and or atelectasis.

## 2020-12-25 MED ORDER — ACETAMINOPHEN 650 MG RE SUPP: 650.0000 mg | Freq: Four times a day (QID) | RECTAL | Status: DC | PRN

## 2020-12-25 MED ORDER — FUROSEMIDE 10 MG/ML IJ SOLN
40.0000 mg | Freq: Two times a day (BID) | INTRAMUSCULAR | Status: DC
  Administered 2020-12-26 (×2): 40 mg via INTRAVENOUS
  Filled 2020-12-25 (×2): qty 4

## 2020-12-25 MED ORDER — RIVAROXABAN 20 MG PO TABS: 20.0000 mg | ORAL_TABLET | Freq: Every day | ORAL | Status: DC

## 2020-12-25 MED ORDER — ACETAMINOPHEN 325 MG PO TABS: 650.0000 mg | ORAL_TABLET | Freq: Four times a day (QID) | ORAL | Status: DC | PRN

## 2020-12-25 MED ORDER — RIVAROXABAN 15 MG PO TABS: 15.0000 mg | ORAL_TABLET | Freq: Two times a day (BID) | ORAL | Status: DC

## 2020-12-25 MED ORDER — POLYETHYLENE GLYCOL 3350 17 G PO PACK: 17.0000 g | PACK | Freq: Every day | ORAL | Status: DC | PRN

## 2020-12-25 MED ORDER — INSULIN ASPART 100 UNIT/ML ~~LOC~~ SOLN
0.0000 [IU] | Freq: Three times a day (TID) | SUBCUTANEOUS | Status: DC
  Administered 2020-12-26: 2 [IU] via SUBCUTANEOUS
  Filled 2020-12-25: qty 0.15

## 2020-12-25 MED ORDER — INSULIN GLARGINE 100 UNIT/ML ~~LOC~~ SOLN
10.0000 [IU] | Freq: Two times a day (BID) | SUBCUTANEOUS | Status: DC
  Filled 2020-12-25 (×3): qty 0.1

## 2020-12-25 MED ORDER — FUROSEMIDE 10 MG/ML IJ SOLN
40.0000 mg | Freq: Once | INTRAMUSCULAR | Status: AC
  Administered 2020-12-25: 40 mg via INTRAVENOUS
  Filled 2020-12-25: qty 4

## 2020-12-25 MED ORDER — DEXAMETHASONE 2 MG PO TABS: 1.0000 mg | ORAL_TABLET | Freq: Every day | ORAL | Status: DC

## 2020-12-25 MED ORDER — MIDODRINE HCL 5 MG PO TABS: 10.0000 mg | ORAL_TABLET | Freq: Every day | ORAL | Status: DC

## 2020-12-25 MED ORDER — SENNA 8.6 MG PO TABS
1.0000 | ORAL_TABLET | ORAL | Status: DC
  Administered 2020-12-26 – 2020-12-28 (×2): 8.6 mg via ORAL
  Filled 2020-12-25 (×3): qty 1

## 2020-12-25 MED ORDER — SODIUM CHLORIDE 0.9% FLUSH
3.0000 mL | Freq: Two times a day (BID) | INTRAVENOUS | Status: DC
  Administered 2020-12-25 – 2020-12-29 (×6): 3 mL via INTRAVENOUS

## 2020-12-25 MED ORDER — ATORVASTATIN CALCIUM 10 MG PO TABS
20.0000 mg | ORAL_TABLET | Freq: Every day | ORAL | Status: DC
  Administered 2020-12-26 – 2020-12-29 (×4): 20 mg via ORAL
  Filled 2020-12-25 (×4): qty 2

## 2020-12-25 MED ORDER — METHOCARBAMOL 500 MG PO TABS: 500.0000 mg | ORAL_TABLET | Freq: Four times a day (QID) | ORAL | Status: DC | PRN

## 2020-12-25 MED ORDER — MIDODRINE HCL 5 MG PO TABS
10.0000 mg | ORAL_TABLET | Freq: Every day | ORAL | Status: DC
  Administered 2020-12-26 – 2020-12-29 (×4): 10 mg via ORAL
  Filled 2020-12-25 (×4): qty 2

## 2020-12-25 MED ORDER — IPRATROPIUM-ALBUTEROL 0.5-2.5 (3) MG/3ML IN SOLN: 3.0000 mL | Freq: Once | RESPIRATORY_TRACT | Status: DC

## 2020-12-25 MED ORDER — ENSURE ENLIVE PO LIQD
237.0000 mL | Freq: Two times a day (BID) | ORAL | Status: DC
  Administered 2020-12-26: 237 mL via ORAL

## 2020-12-25 MED ORDER — RIVAROXABAN 15 MG PO TABS
15.0000 mg | ORAL_TABLET | Freq: Two times a day (BID) | ORAL | Status: DC
  Administered 2020-12-25 – 2020-12-29 (×8): 15 mg via ORAL
  Filled 2020-12-25 (×8): qty 1

## 2020-12-25 MED ORDER — DEXAMETHASONE 0.5 MG PO TABS
1.0000 mg | ORAL_TABLET | Freq: Every day | ORAL | Status: DC
  Administered 2020-12-26 – 2020-12-29 (×4): 1 mg via ORAL
  Filled 2020-12-25 (×5): qty 2

## 2020-12-25 MED ORDER — CHLORHEXIDINE GLUCONATE CLOTH 2 % EX PADS
6.0000 | MEDICATED_PAD | Freq: Every day | CUTANEOUS | Status: DC
  Administered 2020-12-26 – 2020-12-29 (×4): 6 via TOPICAL

## 2020-12-25 MED ORDER — TAMSULOSIN HCL 0.4 MG PO CAPS
0.4000 mg | ORAL_CAPSULE | Freq: Every day | ORAL | Status: DC
  Administered 2020-12-26 – 2020-12-29 (×4): 0.4 mg via ORAL
  Filled 2020-12-25 (×4): qty 1

## 2020-12-25 MED ORDER — SODIUM CHLORIDE 0.9% FLUSH
10.0000 mL | INTRAVENOUS | Status: DC | PRN
Start: 2020-12-25 — End: 2020-12-29
  Administered 2020-12-26 – 2020-12-27 (×2): 10 mL

## 2020-12-25 MED ORDER — IPRATROPIUM-ALBUTEROL 0.5-2.5 (3) MG/3ML IN SOLN: 3.0000 mL | Freq: Four times a day (QID) | RESPIRATORY_TRACT | Status: DC | PRN

## 2020-12-25 NOTE — ED Notes (Signed)
Attempted to call report; room upstairs is dirty and floor RN will call me for report when the room is ready

## 2020-12-25 NOTE — Progress Notes (Signed)
Dennis [name] [phone]   [phone]  OFFICE PROGRESS NOTE  [name], MD [place] [id number]  DIAGNOSIS: Extensive stage small cell lung cancer. He presented with a spiculated right middle lobe pulmonary nodule and mildly enlargedsolitary aortopulmonary lymph node. 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 He was diagnosed in March 2021.  PRIOR THERAPY: 1) Whole brain radiation under the care of Dr. Lisbeth Renshaw. Completed 01/17/2020  CURRENT THERAPY: Systemic chemotherapy withcarboplatin for an AUC of 5 on day 1,etoposide 100 mg/m on days 1, 2, and 3, and Imfinzi 1500 mg on day 1 IV every 3 weeks. First dose expected on4/20/21. Status post 7  cycles.  Starting from cycle #5 the patient will be treated with maintenance Imfinzi 1500 mg IV every 4 weeks.  He has been off treatment for the last 3 months.  INTERVAL HISTORY: Dennis [name]. 60 y.o. male returns to the clinic today for follow-up visit accompanied by his brother.  The patient is complaining of increasing fatigue and weakness as well as the swelling of the lower extremities and shortness of breath at baseline increased with exertion and he is currently on home oxygen.  His respiratory rate was up to 32.  He has no chest pain, cough or hemoptysis.  He has a history of congestive heart failure and followed by Dr. Haroldine Laws.  He has been on treatment with Lasix 20 mg p.o. daily.  The patient was recently diagnosed with deep venous thrombosis of the left lower lower extremity.  He was on Coumadin at that time.  We switched his anticoagulation to Xarelto and he started the starter kit few days ago.  He had repeat CT scan of the chest, abdomen pelvis performed recently and he is here for evaluation and discussion of his treatment options.   MEDICAL HISTORY: Past Medical History:  Diagnosis Date  . Alcohol abuse  with alcohol-induced mood disorder ([place]) 08/18/2015  . Automatic implantable cardioverter-defibrillator in situ 2012   S/P St Jude Dual Chamber. ICD.  Marland Kitchen CAD (coronary artery disease) 11/16/2014   CAD Coronary angiography (12/2008) with mid LAD totally occluded and collateralized.  . Cardiac LV ejection fraction 10-20%   . Chest pain 09/04/2018  . CHF (congestive heart failure) ([place])   . Chronic systolic heart failure (HCC)    a) Mixed ICM/NICM b) RHC (05/2014): RA 2, RV 19/2/3, PA 22/14 (18), PCWP 6, Fick CO/CI: 5.2 / 2.7, PVR 2.3 WU, PA 60% and 64% c) ECHO (05/2014): EF 20-25%, diff HK, akinesis entireanteroseptal myocardium, triv AI, mod MR, LA mod/sev dilated  . Cocaine abuse ([place]) 01/24/2015  . Cocaine abuse with cocaine-induced mood disorder ([place]) 08/20/2015  . Drug abuse and dependence ([place])   . Dysphagia following cerebral infarction 01/24/2015  . Embolic stroke involving right middle cerebral artery ([place])   . Extensive stage primary small cell carcinoma of lung ([place]) 01/04/2020  . H/O noncompliance with medical treatment, presenting hazards to health 01/24/2015  . Heart murmur   . High cholesterol   . Ischemic cardiomyopathy    a) Coronary angiography (12/2008) at [facility]: [place]: nl, LAD mid 100% stenosis with left to left and right-to-left collaterals to the distal LAD; Lcx: nl, RCA nl.    . 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[place])   . MDD (major depressive disorder), recurrent severe, without psychosis ([place]) 08/18/2015  . Open wound of foot 02/27/2015  .  Pneumonia 1988  . Psychoactive substance-induced mood disorder ([place]) 07/17/2015  . Sleep apnea    "cleared after T&A"  . Status post PICC central line placement   . Stroke ([place])   . Substance induced mood disorder ([place]) 08/17/2015  . Type II diabetes mellitus (HCC)     ALLERGIES:  has No Known Allergies.  MEDICATIONS:  Current Outpatient Medications  Medication Sig Dispense Refill  . Accu-Chek Softclix Lancets lancets     .  atorvastatin (LIPITOR) 20 MG tablet TAKE 1 TABLET BY MOUTH DAILY AT 6 PM. (Patient not taking: No sig reported) 30 tablet 6  . Blood Glucose Monitoring Suppl (FIFTY50 GLUCOSE METER 2.0) w/Device KIT Use as instructed    . dexamethasone (DECADRON) 1 MG tablet Take 1 tablet (1 mg total) by mouth daily. 60 tablet 2  . feeding supplement (ENSURE ENLIVE / ENSURE PLUS) LIQD Take 237 mLs by mouth 2 (two) times daily between meals. 237 mL 0  . furosemide (LASIX) 20 MG tablet Take 1 tablet (20 mg total) by mouth daily. 30 tablet 0  . HYDROcodone-acetaminophen (NORCO/VICODIN) 5-325 MG tablet Take 1 tablet by mouth every 6 (six) hours as needed for severe pain. 20 tablet 0  . insulin glargine (LANTUS SOLOSTAR) 100 UNIT/ML Solostar Pen Inject 25 Units into the skin 2 (two) times daily. (Patient taking differently: Inject 25 Units into the skin in the morning and at bedtime.) 15 mL 2  . insulin lispro (HUMALOG KWIKPEN) 100 UNIT/ML KwikPen Inject 6 Units into the skin 3 (three) times daily. (Patient taking differently: Inject 6 Units into the skin 3 (three) times daily with meals.) 15 mL 1  . INSUPEN ULTRAFIN 31G X 8 MM MISC     . lidocaine-prilocaine (EMLA) cream Apply 1 application topically as needed. (Patient taking differently: Apply 1 application topically as needed (as directed).) 30 g 2  . methocarbamol (ROBAXIN) 500 MG tablet Take 1 tablet (500 mg total) by mouth every 6 (six) hours as needed for muscle spasms.    . midodrine (PROAMATINE) 10 MG tablet Take 1 tablet (10 mg total) by mouth 3 (three) times daily with meals. 90 tablet 2  . Multiple Vitamin (MULTIVITAMIN WITH MINERALS) TABS tablet Take 1 tablet by mouth daily.    . potassium chloride (KLOR-CON) 10 MEQ tablet Take 1 tablet (10 mEq total) by mouth daily. 90 each 0  . prochlorperazine (COMPAZINE) 10 MG tablet Take 10 mg by mouth every 6 (six) hours as needed for nausea or vomiting.    Marland Kitchen RIVAROXABAN (XARELTO) VTE STARTER PACK (15 & 20 MG) Follow  package directions: Take one 96m tablet by mouth twice a day. On day 22, switch to one 21mtablet once a day. Take with food. 51 each 0  . senna (SENOKOT) 8.6 MG TABS tablet Take 1 tablet (8.6 mg total) by mouth 2 (two) times daily. 120 tablet 0  . sennosides-docusate sodium (SENOKOT-S) 8.6-50 MG tablet Take 1 tablet by mouth daily as needed for constipation.    . sodium chloride 1 g tablet Take 1 tablet (1 g total) by mouth in the morning.    . tamsulosin (FLOMAX) 0.4 MG CAPS capsule Take 0.4 mg by mouth daily.    . Vitamin D3 (VITAMIN D) 25 MCG tablet Take 2 tablets (2,000 Units total) by mouth 2 (two) times daily. 120 tablet 0   No current facility-administered medications for this visit.    SURGICAL HISTORY:  Past Surgical History:  Procedure Laterality Date  . BRONCHIAL  BRUSHINGS  12/28/2019   Procedure: BRONCHIAL BRUSHINGS;  Surgeon: Garner Nash, DO;  Location: [facility];  Service: Thoracic;;  . BRONCHIAL NEEDLE ASPIRATION BIOPSY  12/28/2019   Procedure: BRONCHIAL NEEDLE ASPIRATION BIOPSIES;  Surgeon: Garner Nash, DO;  Location: [facility];  Service: Thoracic;;  . BRONCHIAL WASHINGS  12/28/2019   Procedure: BRONCHIAL WASHINGS;  Surgeon: Garner Nash, DO;  Location: [facility];  Service: Thoracic;;  . CARDIAC CATHETERIZATION  05/2014  . CARDIAC DEFIBRILLATOR PLACEMENT  03/2011  . CHOLECYSTECTOMY  1994  . FEMUR IM NAIL Left 10/31/2020   Procedure: INTRAMEDULLARY (IM) RETROGRADE FEMORAL NAILING;  Surgeon: Shona Needles, MD;  Location: [facility];  Service: Orthopedics;  Laterality: Left;  . FOREARM FRACTURE SURGERY Left 1980  . FRACTURE SURGERY    . IR IMAGING GUIDED PORT INSERTION  10/11/2020  . RIGHT HEART CATHETERIZATION N/A 05/30/2014   Procedure: RIGHT HEART CATH;  Surgeon: Jolaine Artist, MD;  Location: [facility] CATH LAB;  Service: Cardiovascular;  Laterality: N/A;  . RIGHT HEART CATHETERIZATION N/A 02/07/2015   Procedure: RIGHT HEART CATH;  Surgeon: Jolaine Artist, MD;  Location: [facility] C[facility] LAB;  Service: Cardiovascular;  Laterality: N/A;  . TONSILLECTOMY AND ADENOIDECTOMY  ~ 1998  . VIDEO BRONCHOSCOPY WITH ENDOBRONCHIAL ULTRASOUND N/A 12/28/2019   Procedure: VIDEO BRONCHOSCOPY WITH ENDOBRONCHIAL ULTRASOUND;  Surgeon: Garner Nash, DO;  Location: [facility];  Service: Thoracic;  Laterality: N/A;    REVIEW OF SYSTEMS:  Constitutional: positive for anorexia and fatigue Eyes: negative Ears, nose, mouth, throat, and face: negative Respiratory: positive for dyspnea on exertion and wheezing Cardiovascular: positive for tachypnea Gastrointestinal: negative Genitourinary:negative Integument/breast: negative Hematologic/lymphatic: negative Musculoskeletal:negative Neurological: negative Behavioral/Psych: negative Endocrine: negative Allergic/Immunologic: negative   PHYSICAL EXAMINATION: General appearance: alert, cooperative, fatigued and no distress Head: Normocephalic, without obvious abnormality, atraumatic Neck: no adenopathy, no JVD, supple, symmetrical, trachea midline and thyroid not enlarged, symmetric, no tenderness/mass/nodules Lymph nodes: Cervical, supraclavicular, and axillary nodes normal. Resp: diminished breath sounds bilaterally, dullness to percussion bilaterally and rales bilaterally Back: symmetric, no curvature. ROM normal. No CVA tenderness. Cardio: regular rate and rhythm, S1, S2 normal, no murmur, click, rub or gallop GI: soft, non-tender; bowel sounds normal; no masses,  no organomegaly Extremities: edema 2 plus edema of the left lower extremity Neurologic: Alert and oriented X 3, normal strength and tone. Normal symmetric reflexes. Normal coordination and gait  ECOG PERFORMANCE STATUS: 2 - Symptomatic, <50% confined to bed  Blood pressure 117/84, pulse (!) 106, temperature 98.4 F (36.9 C), temperature source Tympanic, resp. rate 20, height 6' (1.829 m), weight 200 lb (90.7 kg), SpO2 97 %.  LABORATORY  DATA: Lab Results  Component Value Date   WBC 9.7 12/18/2020   HGB 11.0 (L) 12/18/2020   HCT 36.4 (L) 12/18/2020   MCV 98.6 12/18/2020   PLT 198 12/18/2020      Chemistry      Component Value Date/Time   NA 142 12/18/2020 1235   K 3.5 12/18/2020 1235   CL 110 12/18/2020 1235   CO2 24 12/18/2020 1235   BUN 24 (H) 12/18/2020 1235   CREATININE 1.17 12/18/2020 1235      Component Value Date/Time   CALCIUM 8.8 (L) 12/18/2020 1235   ALKPHOS 163 (H) 12/18/2020 1235   AST 28 12/18/2020 1235   ALT 45 (H) 12/18/2020 1235   BILITOT 0.5 12/18/2020 1235       RADIOGRAPHIC STUDIES: CT Chest W Contrast  Result Date: 12/25/2020 CLINICAL DATA:  Metastatic small-cell lung cancer restaging, known brain metastatic disease EXAM: CT CHEST, ABDOMEN, AND PELVIS WITH CONTRAST TECHNIQUE: Multidetector CT imaging of the chest, abdomen and pelvis was performed following the standard protocol during bolus administration of intravenous contrast. CONTRAST:  1108m OMNIPAQUE IOHEXOL 300 MG/ML SOLN, additional oral enteric contrast COMPARISON:  09/17/2020 FINDINGS: CT CHEST FINDINGS Cardiovascular: Right chest port catheter. Aortic atherosclerosis. Cardiomegaly. Three-vessel coronary artery calcifications. No pericardial effusion. Mediastinum/Nodes: Newly enlarged pretracheal and subcarinal lymph nodes, largest pretracheal nodes measuring 2.4 x 1.7 cm (series 2, image 22). Thyroid gland, trachea, and esophagus demonstrate no significant findings. Lungs/Pleura: New small bilateral pleural effusions with associated atelectasis or consolidation. Moderate centrilobular emphysema. Interval enlargement of a spiculated nodule of the posterolateral segment of the right middle lobe, abutting the major fissure, measuring 1.6 x 1.0 cm, previously 1.1 x 0.7 cm when measured similarly (series 4, image 90). Is diffuse bilateral interlobular septal thickening. Diffuse bilateral bronchial wall thickening. Musculoskeletal: No chest  wall mass or suspicious bone lesions identified. CT ABDOMEN PELVIS FINDINGS Hepatobiliary: No focal liver abnormality is seen. Status post cholecystectomy. No biliary dilatation. Pancreas: Unremarkable. No pancreatic ductal dilatation or surrounding inflammatory changes. Spleen: Normal in size without significant abnormality. Adrenals/Urinary Tract: Stable low-attenuation nodule of the right adrenal gland measuring 2.7 cm, previously non FDG avid and characterized as a benign adenoma (series 2, image 60). Punctuate nonobstructive calculus of the inferior pole of the right kidney. No hydronephrosis bladder is unremarkable. Stomach/Bowel: Stomach is within normal limits. Appendix appears normal. No evidence of bowel wall thickening, distention, or inflammatory changes. Vascular/Lymphatic: Aortic atherosclerosis. No enlarged abdominal or pelvic lymph nodes. Reproductive: No mass or other abnormality. Other: No abdominal wall hernia or abnormality. No abdominopelvic ascites. Musculoskeletal: No acute or significant osseous findings. IMPRESSION: 1. Interval enlargement of a spiculated nodule of the posterolateral segment of the right middle lobe, consistent with worsened primary lung malignancy. 2. Newly enlarged pretracheal and subcarinal lymph nodes, consistent with nodal metastatic disease. 3. New small bilateral pleural effusions with associated atelectasis or consolidation. There is diffuse bilateral interlobular septal thickening. These findings suggest pulmonary edema but lymphangitic metastatic disease can produce this appearance. No obvious pleural nodularity identified. 4. No evidence of metastatic disease within the abdomen or pelvis. 5. Emphysema. 6. Coronary artery disease. Aortic Atherosclerosis (ICD10-I70.0) and Emphysema (ICD10-J43.9). Electronically Signed   By: AEddie CandleM.D.   On: 12/25/2020 10:05   CT Abdomen Pelvis W Contrast  Result Date: 12/25/2020 CLINICAL DATA:  Metastatic small-cell lung  cancer restaging, known brain metastatic disease EXAM: CT CHEST, ABDOMEN, AND PELVIS WITH CONTRAST TECHNIQUE: Multidetector CT imaging of the chest, abdomen and pelvis was performed following the standard protocol during bolus administration of intravenous contrast. CONTRAST:  1068mOMNIPAQUE IOHEXOL 300 MG/ML SOLN, additional oral enteric contrast COMPARISON:  09/17/2020 FINDINGS: CT CHEST FINDINGS Cardiovascular: Right chest port catheter. Aortic atherosclerosis. Cardiomegaly. Three-vessel coronary artery calcifications. No pericardial effusion. Mediastinum/Nodes: Newly enlarged pretracheal and subcarinal lymph nodes, largest pretracheal nodes measuring 2.4 x 1.7 cm (series 2, image 22). Thyroid gland, trachea, and esophagus demonstrate no significant findings. Lungs/Pleura: New small bilateral pleural effusions with associated atelectasis or consolidation. Moderate centrilobular emphysema. Interval enlargement of a spiculated nodule of the posterolateral segment of the right middle lobe, abutting the major fissure, measuring 1.6 x 1.0 cm, previously 1.1 x 0.7 cm when measured similarly (series 4, image 90). Is diffuse bilateral interlobular septal thickening. Diffuse bilateral bronchial wall thickening. Musculoskeletal: No chest wall mass or suspicious bone lesions identified.  CT ABDOMEN PELVIS FINDINGS Hepatobiliary: No focal liver abnormality is seen. Status post cholecystectomy. No biliary dilatation. Pancreas: Unremarkable. No pancreatic ductal dilatation or surrounding inflammatory changes. Spleen: Normal in size without significant abnormality. Adrenals/Urinary Tract: Stable low-attenuation nodule of the right adrenal gland measuring 2.7 cm, previously non FDG avid and characterized as a benign adenoma (series 2, image 60). Punctuate nonobstructive calculus of the inferior pole of the right kidney. No hydronephrosis bladder is unremarkable. Stomach/Bowel: Stomach is within normal limits. Appendix appears  normal. No evidence of bowel wall thickening, distention, or inflammatory changes. Vascular/Lymphatic: Aortic atherosclerosis. No enlarged abdominal or pelvic lymph nodes. Reproductive: No mass or other abnormality. Other: No abdominal wall hernia or abnormality. No abdominopelvic ascites. Musculoskeletal: No acute or significant osseous findings. IMPRESSION: 1. Interval enlargement of a spiculated nodule of the posterolateral segment of the right middle lobe, consistent with worsened primary lung malignancy. 2. Newly enlarged pretracheal and subcarinal lymph nodes, consistent with nodal metastatic disease. 3. New small bilateral pleural effusions with associated atelectasis or consolidation. There is diffuse bilateral interlobular septal thickening. These findings suggest pulmonary edema but lymphangitic metastatic disease can produce this appearance. No obvious pleural nodularity identified. 4. No evidence of metastatic disease within the abdomen or pelvis. 5. Emphysema. 6. Coronary artery disease. Aortic Atherosclerosis (ICD10-I70.0) and Emphysema (ICD10-J43.9). Electronically Signed   By: Eddie Candle M.D.   On: 12/25/2020 10:05   CUP PACEART REMOTE DEVICE CHECK  Result Date: 12/06/2020 Alert for tachy therapy disabled. Routing to triage for further evaluation. Known ERI status. HB Pacerart note dated 11/14/20, tachy therapies disabled by industry via order from Dr. Lovena Le.  No plans for gen change Confirmed with industry rep, tachy therapies were disabled at request of Dr. Lovena Le, via device clinic RN as noted in [facility]. Tommye Standard, PA-c  VAS [place] LOWER EXTREMITY VENOUS (DVT)  Result Date: 12/19/2020  Lower Venous DVT Study Indications: LLE swelling.  Risk Factors: Cancer Lung CA. Comparison Study: Previous 05/15/2014 - negative Performing Technologist: Rogelia Rohrer  Examination Guidelines: A complete evaluation includes B-mode imaging, spectral Doppler, color Doppler, and power Doppler as needed of all  accessible portions of each vessel. Bilateral testing is considered an integral part of a complete examination. Limited examinations for reoccurring indications may be performed as noted. The reflux portion of the exam is performed with the patient in reverse Trendelenburg.  +---------+---------------+---------+-----------+----------+--------------+ RIGHT    CompressibilityPhasicitySpontaneityPropertiesThrombus Aging +---------+---------------+---------+-----------+----------+--------------+ CFV      Full           Yes      Yes                                 +---------+---------------+---------+-----------+----------+--------------+ SFJ      Full                                                        +---------+---------------+---------+-----------+----------+--------------+ FV Prox  Full           Yes      Yes                                 +---------+---------------+---------+-----------+----------+--------------+ FV Mid   Full  Yes      Yes                                 +---------+---------------+---------+-----------+----------+--------------+ FV DistalFull           Yes      Yes                                 +---------+---------------+---------+-----------+----------+--------------+ PFV      Full                                                        +---------+---------------+---------+-----------+----------+--------------+ POP      Full           Yes      Yes                                 +---------+---------------+---------+-----------+----------+--------------+ PTV      Full                                                        +---------+---------------+---------+-----------+----------+--------------+ PERO     Full                                                        +---------+---------------+---------+-----------+----------+--------------+    +---------+---------------+---------+-----------+----------+-----------------+ LEFT     CompressibilityPhasicitySpontaneityPropertiesThrombus Aging    +---------+---------------+---------+-----------+----------+-----------------+ CFV      Full           Yes      Yes                                    +---------+---------------+---------+-----------+----------+-----------------+ SFJ      Full                                                           +---------+---------------+---------+-----------+----------+-----------------+ FV Prox  Full           Yes      Yes                                    +---------+---------------+---------+-----------+----------+-----------------+ FV Mid   Full           Yes      Yes                                    +---------+---------------+---------+-----------+----------+-----------------+ FV DistalFull  Yes      Yes                                    +---------+---------------+---------+-----------+----------+-----------------+ PFV      Full                                                           +---------+---------------+---------+-----------+----------+-----------------+ POP      Partial        Yes      Yes                  Age Indeterminate +---------+---------------+---------+-----------+----------+-----------------+ PTV      Full                                                           +---------+---------------+---------+-----------+----------+-----------------+ PERO     Full                                                           +---------+---------------+---------+-----------+----------+-----------------+     Summary: BILATERAL: -No evidence of popliteal cyst, bilaterally. RIGHT: - There is no evidence of deep vein thrombosis in the lower extremity. - There is no evidence of superficial venous thrombosis.  LEFT: - Findings consistent with age indeterminate deep vein thrombosis involving the  left popliteal vein. - There is no evidence of superficial venous thrombosis.  *See table(s) above for measurements and observations. Electronically signed by Harold Barban MD on 12/19/2020 at 10:07:24 PM.    Final     ASSESSMENT AND PLAN: This is a very pleasant 60 years old African-American male recently diagnosed with extensive stage small cell lung cancer presented with spiculated right middle lobe pulmonary nodule in addition to mediastinal lymphadenopathy and multiple metastatic brain lesions.  The patient is status post whole brain irradiation. He is currently undergoing systemic chemotherapy with carboplatin, etoposide and Imfinzi status post 8 cycles.  Starting from cycle #5 the patient is on maintenance treatment with immunotherapy with Imfinzi every 4 weeks. The patient has been off treatment for the last few months secondary to frequent hospitalization with COVID-19 as well as fracture of the left femur.  He is recovering well from his hospitalization and the surgery for the left femur. The patient has been off treatment for the last several months. He had repeat CT scan of the chest, abdomen pelvis performed recently.  His scan showed interval enlargement of a spiculated nodule of the posterior lateral segment of the right middle lobe as well as newly enlarged pretracheal and subcarinal lymph nodes consistent with nodal metastatic disease but the patient had evidence for pulmonary edema as well as new small bilateral pleural effusion with associated atelectasis or consolidation which more consistent with his congestive heart failure.  He is tachypneic in the clinic with swelling of the lower extremities. I had a lengthy discussion with the patient and  his brother today about his current condition and treatment options. I think the priority at this point is to control his congestive heart failure and treat the pulmonary edema.  I will send him to the emergency department for further evaluation and  probably admission and consultation with Dr. Haroldine Laws for management of his congestive heart failure. Regarding the progressive lung cancer, the volume of his disease is still small at this point and the patient is interested in treatment in the future.  I may consider him for repeat systemic chemotherapy after improvement of his general condition but palliative care on hospice is also a consideration. I will arrange for the patient a follow-up appointment with me after discharge from the hospital and control of his congestive heart failure for more detailed discussion of his systemic treatment options. Regarding the left lower extremity deep venous thrombosis, the patient will continue his current treatment with Xarelto unless Dr. Haroldine Laws would like to switch him to Eliquis because of his cardiac condition. The patient was advised to call if he has any other concerning issues. The patient voices understanding of current disease status and treatment options and is in agreement with the current care plan.  All questions were answered. The patient knows to call the clinic with any problems, questions or concerns. We can certainly see the patient much sooner if necessary.  Disclaimer: This note was dictated with voice recognition software. Similar sounding words can inadvertently be transcribed and may not be corrected upon review.

## 2020-12-25 NOTE — H&P (Signed)
History and Physical   Dennis [name]. XMI:[id number] DOB: [birth year] DOA: 12/25/2020  PCP: [name], MD   Patient coming from: Home via oncologist office  Chief Complaint: Shortness of breath, edema  HPI: Dennis [name]. is a 60 y.o. 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  Patient was sent to the ED by his oncologist who noticed that he was tachycardic and short of breath with edema during outpatient visit today.  Patient confirmed that he has had 1 week of increasing shortness of breath and lower extremity edema.  He also reports orthopnea and significant dyspnea on exertion including with walking.  He denies fevers, chills, chest pain, abdominal pain, constipation, diarrhea, nausea, vomiting.  ED Course: Vital signs in ED significant for tachycardia in the 100s, tachypnea in the 20s.  Lab work-up showed CMP with BUN 22, calcium 8.6, protein 6, albumin 3, ALT stable at 67, ALP stable 163, hemoglobin 10.9 which is stable.  BMP greater than 1000.  Imaging showed chest x-ray with diffuse increased interstitial markings compatible with edema, persistent pleural effusions, left lower lobe atelectasis due to effusion versus pneumonia.  Patient was given a dose of IV Lasix in ED.  Review of Systems: As per HPI otherwise all other systems reviewed and are negative.  Past Medical History:  Diagnosis Date  . Alcohol abuse with alcohol-induced mood disorder ([place]) 08/18/2015  . Automatic implantable cardioverter-defibrillator in situ 2012   S/P St Jude Dual Chamber. ICD.  Marland Kitchen CAD (coronary artery disease) 11/16/2014   CAD Coronary angiography (12/2008) with mid LAD totally occluded and collateralized.  . Cardiac LV ejection fraction 10-20%   .  Chest pain 09/04/2018  . CHF (congestive heart failure) ([place])   . Chronic systolic heart failure (HCC)    a) Mixed ICM/NICM b) RHC (05/2014): RA 2, RV 19/2/3, PA 22/14 (18), PCWP 6, Fick CO/CI: 5.2 / 2.7, PVR 2.3 WU, PA 60% and 64% c) ECHO (05/2014): EF 20-25%, diff HK, akinesis entireanteroseptal myocardium, triv AI, mod MR, LA mod/sev dilated  . Cocaine abuse ([place]) 01/24/2015  . Cocaine abuse with cocaine-induced mood disorder ([place]) 08/20/2015  . Drug abuse and dependence ([place])   . Dysphagia following cerebral infarction 01/24/2015  . Embolic stroke involving right middle cerebral artery ([place])   . Extensive stage primary small cell carcinoma of lung ([place]) 01/04/2020  . H/O noncompliance with medical treatment, presenting hazards to health 01/24/2015  . Heart murmur   . High cholesterol   . Ischemic cardiomyopathy    a) Coronary angiography (12/2008) at [facility] L[place]: nl, LAD mid 100% stenosis with left to left and right-to-left collaterals to the distal LAD; Lcx: nl, RCA nl.    . 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[place])   . MDD (major depressive disorder), recurrent severe, without psychosis ([place]) 08/18/2015  . Open wound of foot 02/27/2015  . Pneumonia 1988  . Psychoactive substance-induced mood disorder ([place]) 07/17/2015  . Sleep apnea    "cleared after T&A"  . Status post PICC central line placement   . Stroke ([place])   . Substance induced mood disorder ([place]) 08/17/2015  . Type II diabetes mellitus ([place])     Past Surgical History:  Procedure Laterality Date  . BRONCHIAL BRUSHINGS  12/28/2019   Procedure: BRONCHIAL BRUSHINGS;  Surgeon: Garner Nash, DO;  Location: [place] [facility];  Service: Thoracic;;  . BRONCHIAL NEEDLE ASPIRATION BIOPSY  12/28/2019   Procedure: BRONCHIAL NEEDLE ASPIRATION BIOPSIES;  Surgeon: Garner Nash, DO;  Location: [place] [facility];  Service: Thoracic;;  . BRONCHIAL WASHINGS  12/28/2019   Procedure: BRONCHIAL WASHINGS;  Surgeon: Garner Nash, DO;  Location: [place];   Service: Thoracic;;  . CARDIAC CATHETERIZATION  05/2014  . CARDIAC DEFIBRILLATOR PLACEMENT  03/2011  . CHOLECYSTECTOMY  1994  . FEMUR IM NAIL Left 10/31/2020   Procedure: INTRAMEDULLARY (IM) RETROGRADE FEMORAL NAILING;  Surgeon: Shona Needles, MD;  Location: [facility];  Service: Orthopedics;  Laterality: Left;  . FOREARM FRACTURE SURGERY Left 1980  . FRACTURE SURGERY    . IR IMAGING GUIDED PORT INSERTION  10/11/2020  . RIGHT HEART CATHETERIZATION N/A 05/30/2014   Procedure: RIGHT HEART CATH;  Surgeon: Jolaine Artist, MD;  Location: [facility] CATH LAB;  Service: Cardiovascular;  Laterality: N/A;  . RIGHT HEART CATHETERIZATION N/A 02/07/2015   Procedure: RIGHT HEART CATH;  Surgeon: Jolaine Artist, MD;  Location: [facility] CATH LAB;  Service: Cardiovascular;  Laterality: N/A;  . TONSILLECTOMY AND ADENOIDECTOMY  ~ 1998  . VIDEO BRONCHOSCOPY WITH ENDOBRONCHIAL ULTRASOUND N/A 12/28/2019   Procedure: VIDEO BRONCHOSCOPY WITH ENDOBRONCHIAL ULTRASOUND;  Surgeon: Garner Nash, DO;  Location: [place];  Service: Thoracic;  Laterality: N/A;    Social History  reports that he has quit smoking. His smoking use included cigarettes. He has a 7.75 pack-year smoking history. He has never used smokeless tobacco. He reports previous alcohol use. He reports current drug use. Drug: Cocaine.  No Known Allergies  Family History  Problem Relation Age of Onset  . Diabetes Mother   . Heart disease Mother   . Diabetes Father   . Prostate cancer Father   . Heart disease Father   . Diabetes Brother   . Diabetes Brother   Reviewed on admission  Prior to Admission medications   Medication Sig Start Date End Date Taking? Authorizing Provider  atorvastatin (LIPITOR) 20 MG tablet TAKE 1 TABLET BY MOUTH DAILY AT 6 PM. 03/11/17  Yes Bensimhon, Shaune Pascal, MD  dexamethasone (DECADRON) 1 MG tablet Take 1 tablet (1 mg total) by mouth daily. 12/18/20  Yes Vaslow, Acey Lav, MD  feeding supplement (ENSURE ENLIVE / ENSURE PLUS)  LIQD Take 237 mLs by mouth 2 (two) times daily between meals. 07/26/20  Yes Eugenie Filler, MD  furosemide (LASIX) 20 MG tablet Take 1 tablet (20 mg total) by mouth daily. 11/28/20 12/28/20 Yes Ghimire, Dante Gang, MD  insulin glargine (LANTUS SOLOSTAR) 100 UNIT/ML Solostar Pen Inject 25 Units into the skin 2 (two) times daily. Patient taking differently: Inject 25 Units into the skin in the morning and at bedtime. 07/25/20  Yes Eugenie Filler, MD  insulin lispro (HUMALOG KWIKPEN) 100 UNIT/ML KwikPen Inject 6 Units into the skin 3 (three) times daily. Patient taking differently: Inject 6 Units into the skin 2 (two) times daily. 07/25/20  Yes Eugenie Filler, MD  methocarbamol (ROBAXIN) 500 MG tablet Take 1 tablet (500 mg total) by mouth every 6 (six) hours as needed for muscle spasms. 11/04/20  Yes Bonnielee Haff, MD  midodrine (PROAMATINE) 10 MG tablet Take 1 tablet (10 mg total) by mouth 3 (three) times daily with meals. Patient taking differently: Take 10 mg by mouth daily. 07/25/20  Yes Eugenie Filler, MD  potassium chloride (KLOR-CON) 10 MEQ tablet Take 1 tablet (10 mEq total) by mouth daily. 11/28/20 02/26/21 Yes  Barb Merino, MD  prochlorperazine (COMPAZINE) 10 MG tablet Take 10 mg by mouth every 6 (six) hours as needed for nausea or vomiting.   Yes [provider]  senna (SENOKOT) 8.6 MG TABS tablet Take 1 tablet (8.6 mg total) by mouth 2 (two) times daily. Patient taking differently: Take 1 tablet by mouth every other day. 11/04/20  Yes Bonnielee Haff, MD  sodium chloride 1 g tablet Take 1 tablet (1 g total) by mouth in the morning. 11/04/20  Yes Bonnielee Haff, MD  tamsulosin (FLOMAX) 0.4 MG CAPS capsule Take 0.4 mg by mouth daily. 10/22/20  Yes [provider]  Vitamin D3 (VITAMIN D) 25 MCG tablet Take 2 tablets (2,000 Units total) by mouth 2 (two) times daily. 11/02/20  Yes Delray Alt, PA-C  Accu-Chek Softclix Lancets lancets  02/03/20   [provider]  Blood Glucose Monitoring Suppl (FIFTY50 GLUCOSE METER 2.0) w/Device KIT Use as instructed 02/03/20   [provider]  HYDROcodone-acetaminophen (NORCO/VICODIN) 5-325 MG tablet Take 1 tablet by mouth every 6 (six) hours as needed for severe pain. Patient not taking: No sig reported 11/28/20   Barb Merino, MD  INSUPEN ULTRAFIN 31G X 8 MM [place]  03/02/20   [provider]  lidocaine-prilocaine (EMLA) cream Apply 1 application topically as needed. Patient not taking: No sig reported 09/24/20   Heilingoetter, Cassandra L, PA-C  Multiple Vitamin (MULTIVITAMIN WITH MINERALS) TABS tablet Take 1 tablet by mouth daily. Patient not taking: No sig reported 11/05/20   Bonnielee Haff, MD  RIVAROXABAN Alveda Reasons) VTE STARTER PACK (15 & 20 MG) Follow package directions: Take one 95m tablet by mouth twice a day. On day 22, switch to one 229mtablet once a day. Take with food. Patient not taking: No sig reported 12/19/20   MoCurt BearsMD    Physical Exam: Vitals:   12/25/20 1636 12/25/20 1637  BP: (!) 125/94   Pulse: (!) 104   Resp: 20   Temp: 97.7 F (36.5 C)   TempSrc: Oral   Weight:  90.7 kg  Height:  6' (1.829 m)   Physical Exam Constitutional:      General: He is not in acute distress.    Appearance: Normal appearance.  HENT:     Head: Normocephalic and atraumatic.     Mouth/Throat:     Mouth: Mucous membranes are moist.     Pharynx: Oropharynx is clear.  Eyes:     Extraocular Movements: Extraocular movements intact.     Pupils: Pupils are equal, round, and reactive to light.  Cardiovascular:     Rate and Rhythm: Normal rate and regular rhythm.     Pulses: Normal pulses.     Heart sounds: Normal heart sounds.  Pulmonary:     Effort: Pulmonary effort is normal. No respiratory distress.     Breath sounds: Wheezing and rales present.     Comments: Mild increased work of breathing Abdominal:     General: Bowel sounds are normal. There is no distension.      Palpations: Abdomen is soft.     Tenderness: There is no abdominal tenderness.  Musculoskeletal:        General: No swelling or deformity.     Right lower leg: Edema present.     Left lower leg: Edema present.  Skin:    General: Skin is warm and dry.  Neurological:     General: No focal deficit present.     Mental Status: Mental status is at  baseline.    Labs on Admission: I have personally reviewed following labs and imaging studies  CBC: Recent Labs  Lab 12/25/20 1726  WBC 10.2  NEUTROABS 8.0*  HGB 10.9*  HCT 36.3*  MCV 101.7*  PLT 967    Basic Metabolic Panel: Recent Labs  Lab 12/25/20 1726  NA 143  K 3.5  CL 109  CO2 27  GLUCOSE 103*  BUN 22*  CREATININE 1.23  CALCIUM 8.6*    GFR: Estimated Creatinine Clearance: 71.9 mL/min (by C-G formula based on SCr of 1.23 mg/dL).  Liver Function Tests: Recent Labs  Lab 12/25/20 1726  AST 31  ALT 67*  ALKPHOS 163*  BILITOT 1.1  PROT 6.0*  ALBUMIN 3.0*    Urine analysis:    Component Value Date/Time   COLORURINE YELLOW 10/30/2020 1151   APPEARANCEUR CLEAR 10/30/2020 1151   LABSPEC 1.018 10/30/2020 1151   PHURINE 5.0 10/30/2020 1151   GLUCOSEU >=500 (A) 10/30/2020 1151   HGBUR MODERATE (A) 10/30/2020 1151   BILIRUBINUR NEGATIVE 10/30/2020 1151   KETONESUR NEGATIVE 10/30/2020 1151   PROTEINUR 100 (A) 10/30/2020 1151   UROBILINOGEN 1.0 07/16/2015 1715   NITRITE NEGATIVE 10/30/2020 1151   LEUKOCYTESUR NEGATIVE 10/30/2020 1151    Radiological Exams on Admission: CT Chest W Contrast  Result Date: 12/25/2020 CLINICAL DATA:  Metastatic small-cell lung cancer restaging, known brain metastatic disease EXAM: CT CHEST, ABDOMEN, AND PELVIS WITH CONTRAST TECHNIQUE: Multidetector CT imaging of the chest, abdomen and pelvis was performed following the standard protocol during bolus administration of intravenous contrast. CONTRAST:  146m OMNIPAQUE IOHEXOL 300 MG/ML SOLN, additional oral enteric contrast COMPARISON:   09/17/2020 FINDINGS: CT CHEST FINDINGS Cardiovascular: Right chest port catheter. Aortic atherosclerosis. Cardiomegaly. Three-vessel coronary artery calcifications. No pericardial effusion. Mediastinum/Nodes: Newly enlarged pretracheal and subcarinal lymph nodes, largest pretracheal nodes measuring 2.4 x 1.7 cm (series 2, image 22). Thyroid gland, trachea, and esophagus demonstrate no significant findings. Lungs/Pleura: New small bilateral pleural effusions with associated atelectasis or consolidation. Moderate centrilobular emphysema. Interval enlargement of a spiculated nodule of the posterolateral segment of the right middle lobe, abutting the major fissure, measuring 1.6 x 1.0 cm, previously 1.1 x 0.7 cm when measured similarly (series 4, image 90). Is diffuse bilateral interlobular septal thickening. Diffuse bilateral bronchial wall thickening. Musculoskeletal: No chest wall mass or suspicious bone lesions identified. CT ABDOMEN PELVIS FINDINGS Hepatobiliary: No focal liver abnormality is seen. Status post cholecystectomy. No biliary dilatation. Pancreas: Unremarkable. No pancreatic ductal dilatation or surrounding inflammatory changes. Spleen: Normal in size without significant abnormality. Adrenals/Urinary Tract: Stable low-attenuation nodule of the right adrenal gland measuring 2.7 cm, previously non FDG avid and characterized as a benign adenoma (series 2, image 60). Punctuate nonobstructive calculus of the inferior pole of the right kidney. No hydronephrosis bladder is unremarkable. Stomach/Bowel: Stomach is within normal limits. Appendix appears normal. No evidence of bowel wall thickening, distention, or inflammatory changes. Vascular/Lymphatic: Aortic atherosclerosis. No enlarged abdominal or pelvic lymph nodes. Reproductive: No mass or other abnormality. Other: No abdominal wall hernia or abnormality. No abdominopelvic ascites. Musculoskeletal: No acute or significant osseous findings. IMPRESSION: 1.  Interval enlargement of a spiculated nodule of the posterolateral segment of the right middle lobe, consistent with worsened primary lung malignancy. 2. Newly enlarged pretracheal and subcarinal lymph nodes, consistent with nodal metastatic disease. 3. New small bilateral pleural effusions with associated atelectasis or consolidation. There is diffuse bilateral interlobular septal thickening. These findings suggest pulmonary edema but lymphangitic metastatic disease can produce this appearance. No  obvious pleural nodularity identified. 4. No evidence of metastatic disease within the abdomen or pelvis. 5. Emphysema. 6. Coronary artery disease. Aortic Atherosclerosis (ICD10-I70.0) and Emphysema (ICD10-J43.9). Electronically Signed   By: Eddie Candle M.D.   On: 12/25/2020 10:05   CT Abdomen Pelvis W Contrast  Result Date: 12/25/2020 CLINICAL DATA:  Metastatic small-cell lung cancer restaging, known brain metastatic disease EXAM: CT CHEST, ABDOMEN, AND PELVIS WITH CONTRAST TECHNIQUE: Multidetector CT imaging of the chest, abdomen and pelvis was performed following the standard protocol during bolus administration of intravenous contrast. CONTRAST:  136m OMNIPAQUE IOHEXOL 300 MG/ML SOLN, additional oral enteric contrast COMPARISON:  09/17/2020 FINDINGS: CT CHEST FINDINGS Cardiovascular: Right chest port catheter. Aortic atherosclerosis. Cardiomegaly. Three-vessel coronary artery calcifications. No pericardial effusion. Mediastinum/Nodes: Newly enlarged pretracheal and subcarinal lymph nodes, largest pretracheal nodes measuring 2.4 x 1.7 cm (series 2, image 22). Thyroid gland, trachea, and esophagus demonstrate no significant findings. Lungs/Pleura: New small bilateral pleural effusions with associated atelectasis or consolidation. Moderate centrilobular emphysema. Interval enlargement of a spiculated nodule of the posterolateral segment of the right middle lobe, abutting the major fissure, measuring 1.6 x 1.0 cm,  previously 1.1 x 0.7 cm when measured similarly (series 4, image 90). Is diffuse bilateral interlobular septal thickening. Diffuse bilateral bronchial wall thickening. Musculoskeletal: No chest wall mass or suspicious bone lesions identified. CT ABDOMEN PELVIS FINDINGS Hepatobiliary: No focal liver abnormality is seen. Status post cholecystectomy. No biliary dilatation. Pancreas: Unremarkable. No pancreatic ductal dilatation or surrounding inflammatory changes. Spleen: Normal in size without significant abnormality. Adrenals/Urinary Tract: Stable low-attenuation nodule of the right adrenal gland measuring 2.7 cm, previously non FDG avid and characterized as a benign adenoma (series 2, image 60). Punctuate nonobstructive calculus of the inferior pole of the right kidney. No hydronephrosis bladder is unremarkable. Stomach/Bowel: Stomach is within normal limits. Appendix appears normal. No evidence of bowel wall thickening, distention, or inflammatory changes. Vascular/Lymphatic: Aortic atherosclerosis. No enlarged abdominal or pelvic lymph nodes. Reproductive: No mass or other abnormality. Other: No abdominal wall hernia or abnormality. No abdominopelvic ascites. Musculoskeletal: No acute or significant osseous findings. IMPRESSION: 1. Interval enlargement of a spiculated nodule of the posterolateral segment of the right middle lobe, consistent with worsened primary lung malignancy. 2. Newly enlarged pretracheal and subcarinal lymph nodes, consistent with nodal metastatic disease. 3. New small bilateral pleural effusions with associated atelectasis or consolidation. There is diffuse bilateral interlobular septal thickening. These findings suggest pulmonary edema but lymphangitic metastatic disease can produce this appearance. No obvious pleural nodularity identified. 4. No evidence of metastatic disease within the abdomen or pelvis. 5. Emphysema. 6. Coronary artery disease. Aortic Atherosclerosis (ICD10-I70.0) and  Emphysema (ICD10-J43.9). Electronically Signed   By: AEddie CandleM.D.   On: 12/25/2020 10:05   DG Chest Port 1 View  Result Date: 12/25/2020 CLINICAL DATA:  Shortness of breath. EXAM: PORTABLE CHEST 1 VIEW COMPARISON:  CT chest 12/24/2020. FINDINGS: Right chest wall port a catheter tip is at the cavoatrial junction. There is a left chest wall pacer device with leads in the right atrial appendage and right ventricle. Aortic atherosclerosis. Diffusely increased interstitial markings identified compatible with pulmonary edema. Small pleural effusions with veil like opacification of the lower lobes identified. Left lung base airspace disease. Chronic interstitial changes of COPD/emphysema. IMPRESSION: 1. Persistent diffuse increase interstitial markings compatible with interstitial edema. Persistent pleural effusions with veil like opacification of the lower lung zones. 2. Asymmetric airspace opacification within the left lower lobe which may reflect pneumonia and or atelectasis. Electronically Signed  By: Kerby Moors M.D.   On: 12/25/2020 16:58   EKG: Independently reviewed.  Sinus tachycardia at 103 bpm.  Borderline prolonged PR interval.  Nonspecific T wave changes in the lateral leads.  Similar to previous.  Assessment/Plan Principal Problem:   Acute on chronic systolic congestive heart failure (HCC) Active Problems:   Automatic implantable cardioverter-defibrillator in situ   Hyperlipidemia associated with type 2 diabetes mellitus ([place])   Left hemiplegia (HCC)   History of CVA with residual deficit   Small cell lung cancer, right upper lobe ([place])   Uncontrolled type 2 diabetes mellitus with hyperglycemia, with long-term current use of insulin (HCC)   Brain metastases (HCC)  Acute on chronic systolic heart failure exacerbation > 1 week of worsening shortness of breath and edema.  Evidence of volume overload on x-ray.  BNP 1000. > Last echo in September 2021 with EF 25-30%.  AICD is in  place. > Received 4 mg IV Lasix in ED - Continue with Lasix 40 mg IV twice daily - Strict I/O, daily weights - Trend renal function electrolytes - Echocardiogram - Add on magnesium  Advance small cell lung cancer with brain mets > Follows with Dr. Julien Nordmann and was seen in the clinic today prompting evaluation in ED > Had been on Imfinzi, etoposide, carboplatin outpatient but this has been held for the last few months after he is admitted for Covid and leg fracture - Continue home dexamethasone  Left lower extremity DVT > On Xarelto -Continue Xarelto 20 mg daily  Diabetes > 25 units twice daily with sliding scale at home. - 10 units twice daily Lantus - SSI  Orthostasis - Continue daily midodrine  Hyperlipidemia CVA with left-sided residual deficits -Continue home atorvastatin  DVT prophylaxis: Xarelto  Code Status:   Full Family Communication:  None on admission Disposition Plan:   Patient is from:  Home  Anticipated DC to:  Home  Anticipated DC date:  1 to 3 days  Anticipated DC barriers: None  Consults called:  None  Admission status:  Observation, telemetry   Severity of Illness: The appropriate patient status for this patient is OBSERVATION. Observation status is judged to be reasonable and necessary in order to provide the required intensity of service to ensure the patient's safety. The patient's presenting symptoms, physical exam findings, and initial radiographic and laboratory data in the context of their medical condition is felt to place them at decreased risk for further clinical deterioration. Furthermore, it is anticipated that the patient will be medically stable for discharge from the hospital within 2 midnights of admission. The following factors support the patient status of observation.   " The patient's presenting symptoms include shortness of breath, edema. " The physical exam findings include edema, rales, wheezes. " The initial radiographic and  laboratory data are chest x-ray with diffuse interstitial markings that are increased compatible with edema, hemoglobin stable 10.9, BNP 1000.   Marcelyn Bruins MD [facility]  How to contact the [facility] Attending or Consulting provider [place] or covering provider during after hours [place], for this patient?   1. Check the care team in [facility] and look for a) attending/consulting TR[facility] provider listed and b) the [facility] team listed 2. Log into [email or web address] and use [place]'s universal password to access. If you do not have the password, please contact the hospital operator. 3. Locate the [facility] provider you are looking for under [facility] and page to a number that you can be directly  reached. 4. If you still have difficulty reaching the provider, please page the [facility] (Director on Call) for the Hospitalists listed on amion for assistance.  12/25/2020, 7:31 PM

## 2020-12-25 NOTE — ED Notes (Signed)
Report called to [place], Therapist, sports

## 2020-12-25 NOTE — ED Notes (Signed)
ED TO INPATIENT HANDOFF REPORT  Name/Age/Gender Dennis [name]. 60 y.o. male  Code Status    Code Status Orders  (From admission, onward)         Start     Ordered   12/25/20 1852  Full code  Continuous        12/25/20 1854        Code Status History    Date Active Date Inactive Code Status Order ID Comments User Context   10/30/2020 1538 11/28/2020 1949 Full Code [id number]  Norval Morton, MD ED   07/08/2020 0127 07/25/2020 2346 Full Code [id number]  Vernelle Emerald, MD ED   12/23/2019 1438 12/28/2019 2357 Full Code [id number]  Doristine Mango L, DO Inpatient   09/04/2018 2356 09/05/2018 1650 Full Code [id number]  Phillips Grout, MD ED   08/17/2015 2244 08/23/2015 2157 Full Code [id number]  Harriet Butte, NP Inpatient   08/17/2015 0032 08/17/2015 2244 Full Code [id number]  Dalia Heading, PA-C ED   07/16/2015 1846 07/17/2015 1836 Full Code [id number]  Davonna Belling, MD ED   02/07/2015 2004 02/14/2015 1629 Full Code [id number]  Bary Leriche, PA-C Inpatient   02/07/2015 1030 02/07/2015 2004 Full Code [id number]  Jolaine Artist, MD Inpatient   02/01/2015 1353 02/07/2015 1030 Full Code [id number]  Hosie Poisson, MD Inpatient   01/26/2015 1821 02/01/2015 1353 Full Code [id number]  Bary Leriche, PA-C Inpatient   01/24/2015 1914 01/26/2015 1821 Full Code [id number]  Annita Brod, MD Inpatient   09/21/2014 1634 09/25/2014 1640 Full Code [id number]  Conrad [place], NP Inpatient   05/30/2014 1445 06/01/2014 1725 Full Code [id number]  Jolaine Artist, MD Inpatient   05/26/2014 1901 05/30/2014 1445 Full Code [id number]  Blain Pais, MD Inpatient   08/09/2013 1426 08/10/2013 1705 Full Code [id number]  Ivor Costa, MD Inpatient   Advance Care Planning Activity      Home/SNF/Other home Chief Complaint Acute exacerbation of CHF (congestive heart failure) ([place]) [I50.9]  Level of Care/Admitting Diagnosis ED Disposition    ED Disposition Condition [place]:  [place] [[id number]]  Level of Care: Telemetry [5]  Admit to tele based on following criteria: Acute CHF  Covid Evaluation: Asymptomatic Screening Protocol (No Symptoms)  Diagnosis: Acute exacerbation of CHF (congestive heart failure) [facility]) [[id number]]  Admitting Physician: Marcelyn Bruins [[id number]]  Attending Physician: Marcelyn Bruins [phone]       Medical History Past Medical History:  Diagnosis Date  . Alcohol abuse with alcohol-induced mood disorder ([place]) 08/18/2015  . Automatic implantable cardioverter-defibrillator in situ 2012   S/P [employer] Jude Dual Chamber. ICD.  Marland Kitchen CAD (coronary artery disease) 11/16/2014   CAD Coronary angiography (12/2008) with mid LAD totally occluded and collateralized.  . Cardiac LV ejection fraction 10-20%   . Chest pain 09/04/2018  . CHF (congestive heart failure) ([place])   . Chronic systolic heart failure (HCC)    a) Mixed ICM/NICM b) RHC (05/2014): RA 2, RV 19/2/3, PA 22/14 (18), PCWP 6, Fick CO/CI: 5.2 / 2.7, PVR 2.3 WU, PA 60% and 64% c) ECHO (05/2014): EF 20-25%, diff HK, akinesis entireanteroseptal myocardium, triv AI, mod MR, LA mod/sev dilated  . Cocaine abuse ([place]) 01/24/2015  . Cocaine abuse with cocaine-induced mood disorder ([place]) 08/20/2015  . Drug abuse and dependence ([place])   . Dysphagia following cerebral infarction 01/24/2015  . Embolic stroke involving right middle cerebral artery ([place])   . Extensive stage primary small  cell carcinoma of lung ([place]) 01/04/2020  . H/O noncompliance with medical treatment, presenting hazards to health 01/24/2015  . Heart murmur   . High cholesterol   . Ischemic cardiomyopathy    a) Coronary angiography (12/2008) at [facility]: L[place]: nl, LAD mid 100% stenosis with left to left and right-to-left collaterals to the distal LAD; Lcx: nl, RCA nl.    . 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[place])   . MDD (major depressive disorder), recurrent severe, without psychosis ([place]) 08/18/2015  . Open wound of foot  02/27/2015  . Pneumonia 1988  . Psychoactive substance-induced mood disorder ([place]) 07/17/2015  . Sleep apnea    "cleared after T&A"  . Status post PICC central line placement   . Stroke ([place])   . Substance induced mood disorder ([place]) 08/17/2015  . Type II diabetes mellitus ([place])     Allergies No Known Allergies  IV Location/Drains/Wounds Patient Lines/Drains/Airways Status    Active Line/Drains/Airways    Name Placement date Placement time Site Days   Implanted Port 10/11/20 Right Chest 10/11/20  1204  Chest  75   Incision (Closed) 10/31/20 Leg Left 10/31/20  1415  -- 55          Labs/Imaging Results for orders placed or performed during the hospital encounter of 12/25/20 (from the past 48 hour(s))  Brain natriuretic peptide     Status: Abnormal   Collection Time: 12/25/20  5:26 PM  Result Value Ref Range   B Natriuretic Peptide 1,073.9 (H) 0.0 - 100.0 pg/mL    Comment: Performed at [facility], [place] [address], [place], [place] [address]  CBC with Differential     Status: Abnormal   Collection Time: 12/25/20  5:26 PM  Result Value Ref Range   WBC 10.2 4.0 - 10.5 K/uL   RBC 3.57 (L) 4.22 - 5.81 MIL/uL   Hemoglobin 10.9 (L) 13.0 - 17.0 g/dL   HCT 36.3 (L) 39.0 - 52.0 %   MCV 101.7 (H) 80.0 - 100.0 fL   MCH 30.5 26.0 - 34.0 pg   MCHC 30.0 30.0 - 36.0 g/dL   RDW 17.4 (H) 11.5 - 15.5 %   Platelets 212 150 - 400 K/uL   nRBC 0.5 (H) 0.0 - 0.2 %   Neutrophils Relative % 79 %   Neutro Abs 8.0 (H) 1.7 - 7.7 K/uL   Lymphocytes Relative 13 %   Lymphs Abs 1.3 0.7 - 4.0 K/uL   Monocytes Relative 7 %   Monocytes Absolute 0.7 0.1 - 1.0 K/uL   Eosinophils Relative 1 %   Eosinophils Absolute 0.1 0.0 - 0.5 K/uL   Basophils Relative 0 %   Basophils Absolute 0.0 0.0 - 0.1 K/uL   Immature Granulocytes 0 %   Abs Immature Granulocytes 0.03 0.00 - 0.07 K/uL    Comment: Performed at [facility], [place] [address] [place],  [address]   Comprehensive metabolic panel     Status: Abnormal   Collection Time: 12/25/20  5:26 PM  Result Value Ref Range   Sodium 143 135 - 145 mmol/L   Potassium 3.5 3.5 - 5.1 mmol/L   Chloride 109 98 - 111 mmol/L   CO2 27 22 - 32 mmol/L   Glucose, Bld 103 (H) 70 - 99 mg/dL    Comment: Glucose reference range applies only to samples taken after fasting for at least 8 hours.   BUN 22 (H) 6 - 20 mg/dL   Creatinine, Ser 1.23 0.61 - 1.24 mg/dL  Calcium 8.6 (L) 8.9 - 10.3 mg/dL   Total Protein 6.0 (L) 6.5 - 8.1 g/dL   Albumin 3.0 (L) 3.5 - 5.0 g/dL   AST 31 15 - 41 U/L   ALT 67 (H) 0 - 44 U/L   Alkaline Phosphatase 163 (H) 38 - 126 U/L   Total Bilirubin 1.1 0.3 - 1.2 mg/dL   GFR, Estimated >60 >60 mL/min    Comment: (NOTE) Calculated using the CKD-EPI Creatinine Equation (2021)    Anion gap 7 5 - 15    Comment: Performed at [facility], [place] [address] [place], [place] [address]   CT Chest W Contrast  Result Date: 12/25/2020 CLINICAL DATA:  Metastatic small-cell lung cancer restaging, known brain metastatic disease EXAM: CT CHEST, ABDOMEN, AND PELVIS WITH CONTRAST TECHNIQUE: Multidetector CT imaging of the chest, abdomen and pelvis was performed following the standard protocol during bolus administration of intravenous contrast. CONTRAST:  191mL OMNIPAQUE IOHEXOL 300 MG/ML SOLN, additional oral enteric contrast COMPARISON:  09/17/2020 FINDINGS: CT CHEST FINDINGS Cardiovascular: Right chest port catheter. Aortic atherosclerosis. Cardiomegaly. Three-vessel coronary artery calcifications. No pericardial effusion. Mediastinum/Nodes: Newly enlarged pretracheal and subcarinal lymph nodes, largest pretracheal nodes measuring 2.4 x 1.7 cm (series 2, image 22). Thyroid gland, trachea, and esophagus demonstrate no significant findings. Lungs/Pleura: New small bilateral pleural effusions with associated atelectasis or consolidation. Moderate centrilobular emphysema. Interval enlargement of  a spiculated nodule of the posterolateral segment of the right middle lobe, abutting the major fissure, measuring 1.6 x 1.0 cm, previously 1.1 x 0.7 cm when measured similarly (series 4, image 90). Is diffuse bilateral interlobular septal thickening. Diffuse bilateral bronchial wall thickening. Musculoskeletal: No chest wall mass or suspicious bone lesions identified. CT ABDOMEN PELVIS FINDINGS Hepatobiliary: No focal liver abnormality is seen. Status post cholecystectomy. No biliary dilatation. Pancreas: Unremarkable. No pancreatic ductal dilatation or surrounding inflammatory changes. Spleen: Normal in size without significant abnormality. Adrenals/Urinary Tract: Stable low-attenuation nodule of the right adrenal gland measuring 2.7 cm, previously non FDG avid and characterized as a benign adenoma (series 2, image 60). Punctuate nonobstructive calculus of the inferior pole of the right kidney. No hydronephrosis bladder is unremarkable. Stomach/Bowel: Stomach is within normal limits. Appendix appears normal. No evidence of bowel wall thickening, distention, or inflammatory changes. Vascular/Lymphatic: Aortic atherosclerosis. No enlarged abdominal or pelvic lymph nodes. Reproductive: No mass or other abnormality. Other: No abdominal wall hernia or abnormality. No abdominopelvic ascites. Musculoskeletal: No acute or significant osseous findings. IMPRESSION: 1. Interval enlargement of a spiculated nodule of the posterolateral segment of the right middle lobe, consistent with worsened primary lung malignancy. 2. Newly enlarged pretracheal and subcarinal lymph nodes, consistent with nodal metastatic disease. 3. New small bilateral pleural effusions with associated atelectasis or consolidation. There is diffuse bilateral interlobular septal thickening. These findings suggest pulmonary edema but lymphangitic metastatic disease can produce this appearance. No obvious pleural nodularity identified. 4. No evidence of  metastatic disease within the abdomen or pelvis. 5. Emphysema. 6. Coronary artery disease. Aortic Atherosclerosis (ICD10-I70.0) and Emphysema (ICD10-J43.9). Electronically Signed   By: Eddie Candle M.D.   On: 12/25/2020 10:05   CT Abdomen Pelvis W Contrast  Result Date: 12/25/2020 CLINICAL DATA:  Metastatic small-cell lung cancer restaging, known brain metastatic disease EXAM: CT CHEST, ABDOMEN, AND PELVIS WITH CONTRAST TECHNIQUE: Multidetector CT imaging of the chest, abdomen and pelvis was performed following the standard protocol during bolus administration of intravenous contrast. CONTRAST:  158mL OMNIPAQUE IOHEXOL 300 MG/ML SOLN, additional oral enteric contrast COMPARISON:  09/17/2020 FINDINGS: CT CHEST FINDINGS Cardiovascular: Right chest port catheter. Aortic atherosclerosis. Cardiomegaly. Three-vessel coronary artery calcifications. No pericardial effusion. Mediastinum/Nodes: Newly enlarged pretracheal and subcarinal lymph nodes, largest pretracheal nodes measuring 2.4 x 1.7 cm (series 2, image 22). Thyroid gland, trachea, and esophagus demonstrate no significant findings. Lungs/Pleura: New small bilateral pleural effusions with associated atelectasis or consolidation. Moderate centrilobular emphysema. Interval enlargement of a spiculated nodule of the posterolateral segment of the right middle lobe, abutting the major fissure, measuring 1.6 x 1.0 cm, previously 1.1 x 0.7 cm when measured similarly (series 4, image 90). Is diffuse bilateral interlobular septal thickening. Diffuse bilateral bronchial wall thickening. Musculoskeletal: No chest wall mass or suspicious bone lesions identified. CT ABDOMEN PELVIS FINDINGS Hepatobiliary: No focal liver abnormality is seen. Status post cholecystectomy. No biliary dilatation. Pancreas: Unremarkable. No pancreatic ductal dilatation or surrounding inflammatory changes. Spleen: Normal in size without significant abnormality. Adrenals/Urinary Tract: Stable  low-attenuation nodule of the right adrenal gland measuring 2.7 cm, previously non FDG avid and characterized as a benign adenoma (series 2, image 60). Punctuate nonobstructive calculus of the inferior pole of the right kidney. No hydronephrosis bladder is unremarkable. Stomach/Bowel: Stomach is within normal limits. Appendix appears normal. No evidence of bowel wall thickening, distention, or inflammatory changes. Vascular/Lymphatic: Aortic atherosclerosis. No enlarged abdominal or pelvic lymph nodes. Reproductive: No mass or other abnormality. Other: No abdominal wall hernia or abnormality. No abdominopelvic ascites. Musculoskeletal: No acute or significant osseous findings. IMPRESSION: 1. Interval enlargement of a spiculated nodule of the posterolateral segment of the right middle lobe, consistent with worsened primary lung malignancy. 2. Newly enlarged pretracheal and subcarinal lymph nodes, consistent with nodal metastatic disease. 3. New small bilateral pleural effusions with associated atelectasis or consolidation. There is diffuse bilateral interlobular septal thickening. These findings suggest pulmonary edema but lymphangitic metastatic disease can produce this appearance. No obvious pleural nodularity identified. 4. No evidence of metastatic disease within the abdomen or pelvis. 5. Emphysema. 6. Coronary artery disease. Aortic Atherosclerosis (ICD10-I70.0) and Emphysema (ICD10-J43.9). Electronically Signed   By: Eddie Candle M.D.   On: 12/25/2020 10:05   DG Chest Port 1 View  Result Date: 12/25/2020 CLINICAL DATA:  Shortness of breath. EXAM: PORTABLE CHEST 1 VIEW COMPARISON:  CT chest 12/24/2020. FINDINGS: Right chest wall port a catheter tip is at the cavoatrial junction. There is a left chest wall pacer device with leads in the right atrial appendage and right ventricle. Aortic atherosclerosis. Diffusely increased interstitial markings identified compatible with pulmonary edema. Small pleural  effusions with veil like opacification of the lower lobes identified. Left lung base airspace disease. Chronic interstitial changes of COPD/emphysema. IMPRESSION: 1. Persistent diffuse increase interstitial markings compatible with interstitial edema. Persistent pleural effusions with veil like opacification of the lower lung zones. 2. Asymmetric airspace opacification within the left lower lobe which may reflect pneumonia and or atelectasis. Electronically Signed   By: Kerby Moors M.D.   On: 12/25/2020 16:58    Pending Labs Unresulted Labs (From admission, onward)          Start     Ordered   12/26/20 0500  Comprehensive metabolic panel  Tomorrow morning,   R        12/25/20 1854   12/26/20 0500  CBC  Tomorrow morning,   R        12/25/20 1854   12/25/20 1916  SARS CORONAVIRUS 2 (TAT 6-24 HRS) Nasopharyngeal Nasopharyngeal Swab  (Tier 3 - Symptomatic/asymptomatic with Precautions)  Once,   STAT  Question Answer Comment  Is this test for diagnosis or screening Screening   Symptomatic for COVID-19 as defined by CDC No   Hospitalized for COVID-19 No   Admitted to ICU for COVID-19 No   Previously tested for COVID-19 Yes   Resident in a congregate (group) care setting No   Employed in healthcare setting No   Has patient completed COVID vaccination(s) (2 doses of Pfizer/Moderna 1 dose of [employer]) Unknown      12/25/20 1916   12/25/20 1854  Magnesium  Add-on,   AD        12/25/20 1854   12/25/20 1851  HIV Antibody (routine testing w rflx)  (HIV Antibody (Routine testing w reflex) panel)  Once,   STAT        12/25/20 1854          Vitals/Pain Today's Vitals   12/25/20 1636 12/25/20 1637  BP: (!) 125/94   Pulse: (!) 104   Resp: 20   Temp: 97.7 F (36.5 C)   TempSrc: Oral   Weight:  90.7 kg  Height:  6' (1.829 m)  PainSc:  0-No pain    Isolation Precautions Airborne and Contact precautions  Medications Medications  atorvastatin (LIPITOR) tablet 20 mg (has  no administration in time range)  senna (SENOKOT) tablet 8.6 mg (has no administration in time range)  tamsulosin (FLOMAX) capsule 0.4 mg (has no administration in time range)  methocarbamol (ROBAXIN) tablet 500 mg (has no administration in time range)  feeding supplement (ENSURE ENLIVE / ENSURE PLUS) liquid 237 mL (has no administration in time range)  sodium chloride flush (NS) 0.9 % injection 3 mL (has no administration in time range)  furosemide (LASIX) injection 40 mg (has no administration in time range)  acetaminophen (TYLENOL) tablet 650 mg (has no administration in time range)    Or  acetaminophen (TYLENOL) suppository 650 mg (has no administration in time range)  polyethylene glycol (MIRALAX / GLYCOLAX) packet 17 g (has no administration in time range)  ipratropium-albuterol (DUONEB) 0.5-2.5 (3) MG/3ML nebulizer solution 3 mL (has no administration in time range)    Followed by  ipratropium-albuterol (DUONEB) 0.5-2.5 (3) MG/3ML nebulizer solution 3 mL (has no administration in time range)  Rivaroxaban (XARELTO) tablet 15 mg (has no administration in time range)    Followed by  rivaroxaban (XARELTO) tablet 20 mg (has no administration in time range)  insulin glargine (LANTUS) injection 10 Units (has no administration in time range)  insulin aspart (novoLOG) injection 0-15 Units (has no administration in time range)  midodrine (PROAMATINE) tablet 10 mg (has no administration in time range)  dexamethasone (DECADRON) tablet 1 mg (has no administration in time range)  furosemide (LASIX) injection 40 mg (40 mg Intravenous Given 12/25/20 1745)    Mobility ambulatory

## 2020-12-25 NOTE — ED Triage Notes (Signed)
Patient coming from cancer center with c/o shortness of breath for about a week. Patient also report stopped taking coumadin last week.

## 2020-12-25 NOTE — ED Provider Notes (Signed)
[facility] DEPT Provider Note   CSN: [id number] Arrival date & time: 12/25/20  1613     History No chief complaint on file.   Dennis Irene Mitcham. is a 60 y.o. male with pertinent past medical history of extensive stage small cell cancer with multiple metastatic brain lesions brought in by cancer center for today for worsening shortness of breath.  He saw [name] today, who wanted him admitted for further evaluation of worsening shortness of breath most likely due to worsening CHF.Per oncology note, patient is on systemic chemotherapy with carboplatin however has been off treatment for the last 3 months due to hospitalization with COVID-19 as well as fracture of the left femur.  Patient states that he is been having worsening shortness of breath for a week, has been having orthopnea for the past week as well, has been worsening over the past couple of days.  Patient states that his feet have started to swell over the last couple of days, this is new for him.  Denies any weight changes that he is aware of.  States that swelling is primarily in his feet, however appears also to be in his face and hands.  Patient is not normally on oxygen at home.  Patient states that he is not been able to walk due to his worsening shortness of breath.  Also admits to fatigue.  Denies any chest pain or palpitations.  Denies any fevers nausea vomiting diarrhea.  Patient recently diagnosed with Covid in January.  States that he still recovering from this.  Denies any sick contacts at home.  Patient is anticoagulated with Xarelto, states that he has been compliant with this.  Followed by Dr. Haroldine Laws in regards to CHF.  Is on Lasix, 20 mg daily.  HPI     Past Medical History:  Diagnosis Date  . Alcohol abuse with alcohol-induced mood disorder ([place]) 08/18/2015  . Automatic implantable cardioverter-defibrillator in situ 2012   S/P St Jude Dual Chamber. ICD.  Marland Kitchen CAD (coronary artery  disease) 11/16/2014   CAD Coronary angiography (12/2008) with mid LAD totally occluded and collateralized.  . Cardiac LV ejection fraction 10-20%   . Chest pain 09/04/2018  . CHF (congestive heart failure) ([place])   . Chronic systolic heart failure (HCC)    a) Mixed ICM/NICM b) RHC (05/2014): RA 2, RV 19/2/3, PA 22/14 (18), PCWP 6, Fick CO/CI: 5.2 / 2.7, PVR 2.3 WU, PA 60% and 64% c) ECHO (05/2014): EF 20-25%, diff HK, akinesis entireanteroseptal myocardium, triv AI, mod MR, LA mod/sev dilated  . Cocaine abuse ([place]) 01/24/2015  . Cocaine abuse with cocaine-induced mood disorder ([place]) 08/20/2015  . Drug abuse and dependence ([place])   . Dysphagia following cerebral infarction 01/24/2015  . Embolic stroke involving right middle cerebral artery ([place])   . Extensive stage primary small cell carcinoma of lung ([place]) 01/04/2020  . H/O noncompliance with medical treatment, presenting hazards to health 01/24/2015  . Heart murmur   . High cholesterol   . Ischemic cardiomyopathy    a) Coronary angiography (12/2008) at [facility]: Lmain: nl, LAD mid 100% stenosis with left to left and right-to-left collaterals to the distal LAD; Lcx: nl, RCA nl.    . 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[place])   . MDD (major depressive disorder), recurrent severe, without psychosis ([place]) 08/18/2015  . Open wound of foot 02/27/2015  . Pneumonia 1988  . Psychoactive substance-induced mood disorder ([place]) 07/17/2015  . Sleep apnea    "cleared after T&A"  .  Status post PICC central line placement   . Stroke ([place])   . Substance induced mood disorder ([place]) 08/17/2015  . Type II diabetes mellitus [facility])     Patient Active Problem List   Diagnosis Date Noted  . Acute exacerbation of CHF (congestive heart failure) ([place]) 12/25/2020  . Femoral distal fracture ([place]) 10/30/2020  . Fall 10/30/2020  . COVID-19 virus infection 10/30/2020  . Brain metastases ([place]) 08/27/2020  . Malignant neoplasm of lung ([place])   . Orthostatic hypotension   . Syncope  07/09/2020  . Slurred speech 07/08/2020  . Uncontrolled type 2 diabetes mellitus with hyperglycemia, with long-term current use of insulin ([place]) 07/08/2020  . Unsteady gait when walking 07/07/2020  . Small cell lung cancer, right upper lobe ([place]) 01/19/2020  . Encounter for antineoplastic chemotherapy 01/19/2020  . Encounter for antineoplastic immunotherapy 01/19/2020  . Extensive stage primary small cell carcinoma of lung ([place]) 01/04/2020  . Goals of care, counseling/discussion 01/04/2020  . Mediastinal adenopathy   . Mass of upper lobe of right lung   . Brain mass 12/23/2019  . Vasogenic cerebral edema ([place]) 12/23/2019  . Left leg paresthesias   . History of CVA with residual deficit   . Acute on chronic systolic congestive heart failure ([place])   . Left-sided weakness   . 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[place])   . Cocaine abuse ([place]) 01/24/2015  . Essential hypertension 01/24/2015  . Dysphagia 01/24/2015  . Left hemiplegia ([place]) 01/24/2015  . Hyperlipidemia associated with type 2 diabetes mellitus ([place]) 11/30/2014  . Chronic systolic heart failure ([place]) 06/20/2014  . Automatic implantable cardioverter-defibrillator in situ   . Drug abuse and dependence ([place])   . Cardiomyopathy, ischemic 07/21/2011  . Type II diabetes mellitus with ophthalmic manifestations ([place]) 09/13/2007    Past Surgical History:  Procedure Laterality Date  . BRONCHIAL BRUSHINGS  12/28/2019   Procedure: BRONCHIAL BRUSHINGS;  Surgeon: Garner Nash, DO;  Location: [facility];  Service: Thoracic;;  . BRONCHIAL NEEDLE ASPIRATION BIOPSY  12/28/2019   Procedure: BRONCHIAL NEEDLE ASPIRATION BIOPSIES;  Surgeon: Garner Nash, DO;  Location: [facility];  Service: Thoracic;;  . BRONCHIAL WASHINGS  12/28/2019   Procedure: BRONCHIAL WASHINGS;  Surgeon: Garner Nash, DO;  Location: [facility];  Service: Thoracic;;  . CARDIAC CATHETERIZATION  05/2014  . CARDIAC DEFIBRILLATOR PLACEMENT  03/2011  .  CHOLECYSTECTOMY  1994  . FEMUR IM NAIL Left 10/31/2020   Procedure: INTRAMEDULLARY (IM) RETROGRADE FEMORAL NAILING;  Surgeon: Shona Needles, MD;  Location: [facility];  Service: Orthopedics;  Laterality: Left;  . FOREARM FRACTURE SURGERY Left 1980  . FRACTURE SURGERY    . IR IMAGING GUIDED PORT INSERTION  10/11/2020  . RIGHT HEART CATHETERIZATION N/A 05/30/2014   Procedure: RIGHT HEART CATH;  Surgeon: Jolaine Artist, MD;  Location: [facility] CATH LAB;  Service: Cardiovascular;  Laterality: N/A;  . RIGHT HEART CATHETERIZATION N/A 02/07/2015   Procedure: RIGHT HEART CATH;  Surgeon: Jolaine Artist, MD;  Location[facility] CATH LAB;  Service: Cardiovascular;  Laterality: N/A;  . TONSILLECTOMY AND ADENOIDECTOMY  ~ 1998  . VIDEO BRONCHOSCOPY WITH ENDOBRONCHIAL ULTRASOUND N/A 12/28/2019   Procedure: VIDEO BRONCHOSCOPY WITH ENDOBRONCHIAL ULTRASOUND;  Surgeon: Garner Nash, DO;  Location: [facility];  Service: Thoracic;  Laterality: N/A;       Family History  Problem Relation Age of Onset  . Diabetes Mother   . Heart disease Mother   . Diabetes Father   . Prostate cancer Father   . Heart  disease Father   . Diabetes Brother   . Diabetes Brother     Social History   Tobacco Use  . Smoking status: Former Smoker    Packs/day: 0.25    Years: 31.00    Pack years: 7.75    Types: Cigarettes  . Smokeless tobacco: Never Used  Vaping Use  . Vaping Use: Never used  Substance Use Topics  . Alcohol use: Not Currently    Alcohol/week: 0.0 standard drinks    Comment: 09/21/2014 "might have a drink q 6 /months, if that"  . Drug use: Yes    Types: Cocaine    Comment: last use 07/06/2020    Home Medications Prior to Admission medications   Medication Sig Start Date End Date Taking? Authorizing Provider  atorvastatin (LIPITOR) 20 MG tablet TAKE 1 TABLET BY MOUTH DAILY AT 6 PM. 03/11/17  Yes Bensimhon, Shaune Pascal, MD  dexamethasone (DECADRON) 1 MG tablet Take 1 tablet (1 mg total) by mouth daily. 12/18/20   Yes Vaslow, Acey Lav, MD  feeding supplement (ENSURE ENLIVE / ENSURE PLUS) LIQD Take 237 mLs by mouth 2 (two) times daily between meals. 07/26/20  Yes Eugenie Filler, MD  furosemide (LASIX) 20 MG tablet Take 1 tablet (20 mg total) by mouth daily. 11/28/20 12/28/20 Yes Ghimire, Dante Gang, MD  insulin glargine (LANTUS SOLOSTAR) 100 UNIT/ML Solostar Pen Inject 25 Units into the skin 2 (two) times daily. Patient taking differently: Inject 25 Units into the skin in the morning and at bedtime. 07/25/20  Yes Eugenie Filler, MD  insulin lispro (HUMALOG KWIKPEN) 100 UNIT/ML KwikPen Inject 6 Units into the skin 3 (three) times daily. Patient taking differently: Inject 6 Units into the skin 2 (two) times daily. 07/25/20  Yes Eugenie Filler, MD  methocarbamol (ROBAXIN) 500 MG tablet Take 1 tablet (500 mg total) by mouth every 6 (six) hours as needed for muscle spasms. 11/04/20  Yes Bonnielee Haff, MD  midodrine (PROAMATINE) 10 MG tablet Take 1 tablet (10 mg total) by mouth 3 (three) times daily with meals. Patient taking differently: Take 10 mg by mouth daily. 07/25/20  Yes Eugenie Filler, MD  potassium chloride (KLOR-CON) 10 MEQ tablet Take 1 tablet (10 mEq total) by mouth daily. 11/28/20 02/26/21 Yes Barb Merino, MD  prochlorperazine (COMPAZINE) 10 MG tablet Take 10 mg by mouth every 6 (six) hours as needed for nausea or vomiting.   Yes [provider]  senna (SENOKOT) 8.6 MG TABS tablet Take 1 tablet (8.6 mg total) by mouth 2 (two) times daily. Patient taking differently: Take 1 tablet by mouth every other day. 11/04/20  Yes Bonnielee Haff, MD  sodium chloride 1 g tablet Take 1 tablet (1 g total) by mouth in the morning. 11/04/20  Yes Bonnielee Haff, MD  tamsulosin (FLOMAX) 0.4 MG CAPS capsule Take 0.4 mg by mouth daily. 10/22/20  Yes [provider]  Vitamin D3 (VITAMIN D) 25 MCG tablet Take 2 tablets (2,000 Units total) by mouth 2 (two) times daily. 11/02/20  Yes Delray Alt,  PA-C  Accu-Chek Softclix Lancets lancets  02/03/20   [provider]  Blood Glucose Monitoring Suppl (FIFTY50 GLUCOSE METER 2.0) w/Device KIT Use as instructed 02/03/20   [provider]  HYDROcodone-acetaminophen (NORCO/VICODIN) 5-325 MG tablet Take 1 tablet by mouth every 6 (six) hours as needed for severe pain. Patient not taking: No sig reported 11/28/20   Barb Merino, MD  Arna Medici ULTRAFIN 31G X 8 MM MISC  03/02/20  [provider]  lidocaine-prilocaine (EMLA) cream Apply 1 application topically as needed. Patient not taking: No sig reported 09/24/20   Heilingoetter, Cassandra L, PA-C  Multiple Vitamin (MULTIVITAMIN WITH MINERALS) TABS tablet Take 1 tablet by mouth daily. Patient not taking: No sig reported 11/05/20   Bonnielee Haff, MD  RIVAROXABAN Alveda Reasons) VTE STARTER PACK (15 & 20 MG) Follow package directions: Take one 60m tablet by mouth twice a day. On day 22, switch to one 295mtablet once a day. Take with food. Patient not taking: No sig reported 12/19/20   MoCurt BearsMD    Allergies    Patient has no known allergies.  Review of Systems   Review of Systems  Constitutional: Negative for chills, diaphoresis, fatigue and fever.  HENT: Negative for congestion, sore throat and trouble swallowing.   Eyes: Negative for pain and visual disturbance.  Respiratory: Positive for shortness of breath. Negative for cough and wheezing.   Cardiovascular: Positive for leg swelling. Negative for chest pain and palpitations.  Gastrointestinal: Negative for abdominal distention, abdominal pain, diarrhea, nausea and vomiting.  Genitourinary: Negative for difficulty urinating.  Musculoskeletal: Negative for back pain, neck pain and neck stiffness.  Skin: Negative for pallor.  Neurological: Negative for dizziness, speech difficulty, weakness and headaches.  Psychiatric/Behavioral: Negative for confusion.    Physical Exam Updated Vital Signs BP (!) 125/94 (BP  Location: Right Arm)   Pulse (!) 104   Temp 97.7 F (36.5 C) (Oral)   Resp 20   Ht 6' (1.829 m)   Wt 90.7 kg   BMI 27.12 kg/m   Physical Exam Constitutional:      General: He is in acute distress.     Appearance: Normal appearance. He is not ill-appearing, toxic-appearing or diaphoretic.     Comments: Patient in respiratory distress, is able to speak to me in short phrases, is tachypneic with accessory muscle use.  Satting at 89 to 91%, placed on 2 L of oxygen oxygen improved to 94%.  HENT:     Mouth/Throat:     Mouth: Mucous membranes are moist.     Pharynx: Oropharynx is clear.  Eyes:     General: No scleral icterus.    Extraocular Movements: Extraocular movements intact.     Pupils: Pupils are equal, round, and reactive to light.  Cardiovascular:     Rate and Rhythm: Normal rate and regular rhythm.     Pulses: Normal pulses.     Heart sounds: Normal heart sounds.  Pulmonary:     Effort: Tachypnea, accessory muscle usage and respiratory distress present.     Breath sounds: No stridor. Rales present. No wheezing or rhonchi.  Chest:     Chest wall: No tenderness.  Abdominal:     General: Abdomen is flat. There is no distension.     Palpations: Abdomen is soft.     Tenderness: There is no abdominal tenderness. There is no guarding or rebound.  Musculoskeletal:        General: No swelling or tenderness. Normal range of motion.     Cervical back: Normal range of motion and neck supple. No rigidity.     Right lower leg: Edema present.     Left lower leg: Edema present.  Skin:    General: Skin is warm and dry.     Capillary Refill: Capillary refill takes less than 2 seconds.     Coloration: Skin is not pale.  Neurological:     General: No focal deficit present.  Mental Status: He is alert and oriented to person, place, and time.  Psychiatric:        Mood and Affect: Mood normal.        Behavior: Behavior normal.     ED Results / Procedures / Treatments    Labs (all labs ordered are listed, but only abnormal results are displayed) Labs Reviewed  CBC WITH DIFFERENTIAL/PLATELET - Abnormal; Notable for the following components:      Result Value   RBC 3.57 (*)    Hemoglobin 10.9 (*)    HCT 36.3 (*)    MCV 101.7 (*)    RDW 17.4 (*)    nRBC 0.5 (*)    Neutro Abs 8.0 (*)    All other components within normal limits  COMPREHENSIVE METABOLIC PANEL - Abnormal; Notable for the following components:   Glucose, Bld 103 (*)    BUN 22 (*)    Calcium 8.6 (*)    Total Protein 6.0 (*)    Albumin 3.0 (*)    ALT 67 (*)    Alkaline Phosphatase 163 (*)    All other components within normal limits  BRAIN NATRIURETIC PEPTIDE  HIV ANTIBODY (ROUTINE TESTING W REFLEX)  MAGNESIUM  COMPREHENSIVE METABOLIC PANEL  CBC    EKG None  Radiology CT Chest W Contrast  Result Date: 12/25/2020 CLINICAL DATA:  Metastatic small-cell lung cancer restaging, known brain metastatic disease EXAM: CT CHEST, ABDOMEN, AND PELVIS WITH CONTRAST TECHNIQUE: Multidetector CT imaging of the chest, abdomen and pelvis was performed following the standard protocol during bolus administration of intravenous contrast. CONTRAST:  116m OMNIPAQUE IOHEXOL 300 MG/ML SOLN, additional oral enteric contrast COMPARISON:  09/17/2020 FINDINGS: CT CHEST FINDINGS Cardiovascular: Right chest port catheter. Aortic atherosclerosis. Cardiomegaly. Three-vessel coronary artery calcifications. No pericardial effusion. Mediastinum/Nodes: Newly enlarged pretracheal and subcarinal lymph nodes, largest pretracheal nodes measuring 2.4 x 1.7 cm (series 2, image 22). Thyroid gland, trachea, and esophagus demonstrate no significant findings. Lungs/Pleura: New small bilateral pleural effusions with associated atelectasis or consolidation. Moderate centrilobular emphysema. Interval enlargement of a spiculated nodule of the posterolateral segment of the right middle lobe, abutting the major fissure, measuring 1.6 x 1.0  cm, previously 1.1 x 0.7 cm when measured similarly (series 4, image 90). Is diffuse bilateral interlobular septal thickening. Diffuse bilateral bronchial wall thickening. Musculoskeletal: No chest wall mass or suspicious bone lesions identified. CT ABDOMEN PELVIS FINDINGS Hepatobiliary: No focal liver abnormality is seen. Status post cholecystectomy. No biliary dilatation. Pancreas: Unremarkable. No pancreatic ductal dilatation or surrounding inflammatory changes. Spleen: Normal in size without significant abnormality. Adrenals/Urinary Tract: Stable low-attenuation nodule of the right adrenal gland measuring 2.7 cm, previously non FDG avid and characterized as a benign adenoma (series 2, image 60). Punctuate nonobstructive calculus of the inferior pole of the right kidney. No hydronephrosis bladder is unremarkable. Stomach/Bowel: Stomach is within normal limits. Appendix appears normal. No evidence of bowel wall thickening, distention, or inflammatory changes. Vascular/Lymphatic: Aortic atherosclerosis. No enlarged abdominal or pelvic lymph nodes. Reproductive: No mass or other abnormality. Other: No abdominal wall hernia or abnormality. No abdominopelvic ascites. Musculoskeletal: No acute or significant osseous findings. IMPRESSION: 1. Interval enlargement of a spiculated nodule of the posterolateral segment of the right middle lobe, consistent with worsened primary lung malignancy. 2. Newly enlarged pretracheal and subcarinal lymph nodes, consistent with nodal metastatic disease. 3. New small bilateral pleural effusions with associated atelectasis or consolidation. There is diffuse bilateral interlobular septal thickening. These findings suggest pulmonary edema but lymphangitic metastatic disease can  produce this appearance. No obvious pleural nodularity identified. 4. No evidence of metastatic disease within the abdomen or pelvis. 5. Emphysema. 6. Coronary artery disease. Aortic Atherosclerosis (ICD10-I70.0) and  Emphysema (ICD10-J43.9). Electronically Signed   By: Eddie Candle M.D.   On: 12/25/2020 10:05   CT Abdomen Pelvis W Contrast  Result Date: 12/25/2020 CLINICAL DATA:  Metastatic small-cell lung cancer restaging, known brain metastatic disease EXAM: CT CHEST, ABDOMEN, AND PELVIS WITH CONTRAST TECHNIQUE: Multidetector CT imaging of the chest, abdomen and pelvis was performed following the standard protocol during bolus administration of intravenous contrast. CONTRAST:  154m OMNIPAQUE IOHEXOL 300 MG/ML SOLN, additional oral enteric contrast COMPARISON:  09/17/2020 FINDINGS: CT CHEST FINDINGS Cardiovascular: Right chest port catheter. Aortic atherosclerosis. Cardiomegaly. Three-vessel coronary artery calcifications. No pericardial effusion. Mediastinum/Nodes: Newly enlarged pretracheal and subcarinal lymph nodes, largest pretracheal nodes measuring 2.4 x 1.7 cm (series 2, image 22). Thyroid gland, trachea, and esophagus demonstrate no significant findings. Lungs/Pleura: New small bilateral pleural effusions with associated atelectasis or consolidation. Moderate centrilobular emphysema. Interval enlargement of a spiculated nodule of the posterolateral segment of the right middle lobe, abutting the major fissure, measuring 1.6 x 1.0 cm, previously 1.1 x 0.7 cm when measured similarly (series 4, image 90). Is diffuse bilateral interlobular septal thickening. Diffuse bilateral bronchial wall thickening. Musculoskeletal: No chest wall mass or suspicious bone lesions identified. CT ABDOMEN PELVIS FINDINGS Hepatobiliary: No focal liver abnormality is seen. Status post cholecystectomy. No biliary dilatation. Pancreas: Unremarkable. No pancreatic ductal dilatation or surrounding inflammatory changes. Spleen: Normal in size without significant abnormality. Adrenals/Urinary Tract: Stable low-attenuation nodule of the right adrenal gland measuring 2.7 cm, previously non FDG avid and characterized as a benign adenoma (series 2,  image 60). Punctuate nonobstructive calculus of the inferior pole of the right kidney. No hydronephrosis bladder is unremarkable. Stomach/Bowel: Stomach is within normal limits. Appendix appears normal. No evidence of bowel wall thickening, distention, or inflammatory changes. Vascular/Lymphatic: Aortic atherosclerosis. No enlarged abdominal or pelvic lymph nodes. Reproductive: No mass or other abnormality. Other: No abdominal wall hernia or abnormality. No abdominopelvic ascites. Musculoskeletal: No acute or significant osseous findings. IMPRESSION: 1. Interval enlargement of a spiculated nodule of the posterolateral segment of the right middle lobe, consistent with worsened primary lung malignancy. 2. Newly enlarged pretracheal and subcarinal lymph nodes, consistent with nodal metastatic disease. 3. New small bilateral pleural effusions with associated atelectasis or consolidation. There is diffuse bilateral interlobular septal thickening. These findings suggest pulmonary edema but lymphangitic metastatic disease can produce this appearance. No obvious pleural nodularity identified. 4. No evidence of metastatic disease within the abdomen or pelvis. 5. Emphysema. 6. Coronary artery disease. Aortic Atherosclerosis (ICD10-I70.0) and Emphysema (ICD10-J43.9). Electronically Signed   By: AEddie CandleM.D.   On: 12/25/2020 10:05   DG Chest Port 1 View  Result Date: 12/25/2020 CLINICAL DATA:  Shortness of breath. EXAM: PORTABLE CHEST 1 VIEW COMPARISON:  CT chest 12/24/2020. FINDINGS: Right chest wall port a catheter tip is at the cavoatrial junction. There is a left chest wall pacer device with leads in the right atrial appendage and right ventricle. Aortic atherosclerosis. Diffusely increased interstitial markings identified compatible with pulmonary edema. Small pleural effusions with veil like opacification of the lower lobes identified. Left lung base airspace disease. Chronic interstitial changes of  COPD/emphysema. IMPRESSION: 1. Persistent diffuse increase interstitial markings compatible with interstitial edema. Persistent pleural effusions with veil like opacification of the lower lung zones. 2. Asymmetric airspace opacification within the left lower lobe which may reflect pneumonia  and or atelectasis. Electronically Signed   By: Kerby Moors M.D.   On: 12/25/2020 16:58    Procedures .Critical Care Performed by: Alfredia Client, PA-C Authorized by: Alfredia Client, PA-C   Critical care provider statement:    Critical care time (minutes):  45   Critical care was time spent personally by me on the following activities:  Discussions with consultants, evaluation of patient's response to treatment, examination of patient, ordering and performing treatments and interventions, ordering and review of laboratory studies, ordering and review of radiographic studies, pulse oximetry, re-evaluation of patient's condition, obtaining history from patient or surrogate and review of old charts     Medications Ordered in ED Medications  atorvastatin (LIPITOR) tablet 20 mg (has no administration in time range)  midodrine (PROAMATINE) tablet 10 mg (has no administration in time range)  dexamethasone (DECADRON) tablet 1 mg (has no administration in time range)  senna (SENOKOT) tablet 8.6 mg (has no administration in time range)  tamsulosin (FLOMAX) capsule 0.4 mg (has no administration in time range)  methocarbamol (ROBAXIN) tablet 500 mg (has no administration in time range)  feeding supplement (ENSURE ENLIVE / ENSURE PLUS) liquid 237 mL (has no administration in time range)  sodium chloride flush (NS) 0.9 % injection 3 mL (has no administration in time range)  furosemide (LASIX) injection 40 mg (has no administration in time range)  acetaminophen (TYLENOL) tablet 650 mg (has no administration in time range)    Or  acetaminophen (TYLENOL) suppository 650 mg (has no administration in time range)   polyethylene glycol (MIRALAX / GLYCOLAX) packet 17 g (has no administration in time range)  furosemide (LASIX) injection 40 mg (40 mg Intravenous Given 12/25/20 1745)    ED Course  I have reviewed the triage vital signs and the nursing notes.  Pertinent labs & imaging results that were available during my care of the patient were reviewed by me and considered in my medical decision making (see chart for details).    MDM Rules/Calculators/A&P                         Dennis Solon Alban. is a 60 y.o. male with pertinent past medical history of extensive stage small cell cancer with multiple metastatic brain lesions brought in by cancer center for today for worsening shortness of breath.  Appears as if this is CHF exacerbation with fluid distribution.  Chest x-ray interpreted me consistent with interstitial edema.  Will give Lasix.  Patient does appear to be in mild respiratory distress, is placed on 2 L of oxygen.  Patient is anticoagulated.  Work-up today with CBC and CMP unremarkable, awaiting BNP.  Chest x-ray does say something about questionable atelectasis versus pneumonia, patient without fever cough think this most likely favors atelectasis.  Patient admitted to Dr. Trilby Drummer, hospitalist.  The patient appears reasonably stabilized for admission considering the current resources, flow, and capabilities available in the ED at this time, and I doubt any other [facility] requiring further screening and/or treatment in the ED prior to admission.  I discussed this case with my attending physician who cosigned this note including patient's presenting symptoms, physical exam, and planned diagnostics and interventions. Attending physician stated agreement with plan or made changes to plan which were implemented.   Final Clinical Impression(s) / ED Diagnoses Final diagnoses:  Acute on chronic congestive heart failure, unspecified heart failure type ([place])    Rx / DC Orders ED Discharge Orders    None  Alfredia Client, PA-C 12/25/20 1857    Davonna Belling, MD 12/25/20 2259

## 2020-12-25 NOTE — Progress Notes (Signed)
Tachypnea , edema face, feet, hands, short of breath . Report called to charge nurse in ED and pt wheeled to ED room 5 .

## 2020-12-25 NOTE — ED Notes (Signed)
History of lung cancer-CT today, increased respiratory distress

## 2020-12-25 NOTE — Progress Notes (Addendum)
Hypoglycemic Event  CBG: 67  Treatment: 4 oz juice/soda & peanut butter & crackers  Symptoms: None  Follow-up CBG: Time:2254 CBG Result:146  Possible Reasons for Event: Inadequate meal intake  Comments/MD notified: Dr. Octavia Bruckner Opyd. Hold lantus for tonight only.    Ryan, Kiristin N

## 2020-12-26 ENCOUNTER — Observation Stay (HOSPITAL_COMMUNITY): Payer: Medicare Other

## 2020-12-26 ENCOUNTER — Telehealth: Payer: Self-pay | Admitting: Internal Medicine

## 2020-12-26 DIAGNOSIS — R0600 Dyspnea, unspecified: Secondary | ICD-10-CM

## 2020-12-26 DIAGNOSIS — I5023 Acute on chronic systolic (congestive) heart failure: Secondary | ICD-10-CM | POA: Diagnosis not present

## 2020-12-26 DIAGNOSIS — Z794 Long term (current) use of insulin: Secondary | ICD-10-CM | POA: Diagnosis not present

## 2020-12-26 DIAGNOSIS — I340 Nonrheumatic mitral (valve) insufficiency: Secondary | ICD-10-CM

## 2020-12-26 DIAGNOSIS — I509 Heart failure, unspecified: Secondary | ICD-10-CM | POA: Diagnosis not present

## 2020-12-26 DIAGNOSIS — X58XXXD Exposure to other specified factors, subsequent encounter: Secondary | ICD-10-CM | POA: Diagnosis present

## 2020-12-26 DIAGNOSIS — Z20822 Contact with and (suspected) exposure to covid-19: Secondary | ICD-10-CM | POA: Diagnosis present

## 2020-12-26 DIAGNOSIS — C7931 Secondary malignant neoplasm of brain: Secondary | ICD-10-CM | POA: Diagnosis present

## 2020-12-26 DIAGNOSIS — Z8042 Family history of malignant neoplasm of prostate: Secondary | ICD-10-CM | POA: Diagnosis not present

## 2020-12-26 DIAGNOSIS — I951 Orthostatic hypotension: Secondary | ICD-10-CM | POA: Diagnosis present

## 2020-12-26 DIAGNOSIS — E1165 Type 2 diabetes mellitus with hyperglycemia: Secondary | ICD-10-CM | POA: Diagnosis present

## 2020-12-26 DIAGNOSIS — I361 Nonrheumatic tricuspid (valve) insufficiency: Secondary | ICD-10-CM

## 2020-12-26 DIAGNOSIS — E11649 Type 2 diabetes mellitus with hypoglycemia without coma: Secondary | ICD-10-CM | POA: Diagnosis not present

## 2020-12-26 DIAGNOSIS — I69354 Hemiplegia and hemiparesis following cerebral infarction affecting left non-dominant side: Secondary | ICD-10-CM | POA: Diagnosis not present

## 2020-12-26 DIAGNOSIS — Z87891 Personal history of nicotine dependence: Secondary | ICD-10-CM | POA: Diagnosis not present

## 2020-12-26 DIAGNOSIS — E1169 Type 2 diabetes mellitus with other specified complication: Secondary | ICD-10-CM | POA: Diagnosis present

## 2020-12-26 DIAGNOSIS — J449 Chronic obstructive pulmonary disease, unspecified: Secondary | ICD-10-CM | POA: Diagnosis present

## 2020-12-26 DIAGNOSIS — C3411 Malignant neoplasm of upper lobe, right bronchus or lung: Secondary | ICD-10-CM | POA: Diagnosis present

## 2020-12-26 DIAGNOSIS — Z9049 Acquired absence of other specified parts of digestive tract: Secondary | ICD-10-CM | POA: Diagnosis not present

## 2020-12-26 DIAGNOSIS — Z8249 Family history of ischemic heart disease and other diseases of the circulatory system: Secondary | ICD-10-CM | POA: Diagnosis not present

## 2020-12-26 DIAGNOSIS — I5043 Acute on chronic combined systolic (congestive) and diastolic (congestive) heart failure: Secondary | ICD-10-CM | POA: Diagnosis present

## 2020-12-26 DIAGNOSIS — Z833 Family history of diabetes mellitus: Secondary | ICD-10-CM | POA: Diagnosis not present

## 2020-12-26 DIAGNOSIS — Z79899 Other long term (current) drug therapy: Secondary | ICD-10-CM | POA: Diagnosis not present

## 2020-12-26 DIAGNOSIS — Z95810 Presence of automatic (implantable) cardiac defibrillator: Secondary | ICD-10-CM | POA: Diagnosis not present

## 2020-12-26 DIAGNOSIS — J9601 Acute respiratory failure with hypoxia: Secondary | ICD-10-CM | POA: Diagnosis present

## 2020-12-26 DIAGNOSIS — E785 Hyperlipidemia, unspecified: Secondary | ICD-10-CM | POA: Diagnosis present

## 2020-12-26 DIAGNOSIS — I2510 Atherosclerotic heart disease of native coronary artery without angina pectoris: Secondary | ICD-10-CM | POA: Diagnosis present

## 2020-12-26 DIAGNOSIS — N400 Enlarged prostate without lower urinary tract symptoms: Secondary | ICD-10-CM | POA: Diagnosis present

## 2020-12-26 DIAGNOSIS — I110 Hypertensive heart disease with heart failure: Secondary | ICD-10-CM | POA: Diagnosis not present

## 2020-12-26 DIAGNOSIS — Z8616 Personal history of COVID-19: Secondary | ICD-10-CM | POA: Diagnosis not present

## 2020-12-26 LAB — GLUCOSE, CAPILLARY
Glucose-Capillary: 60 mg/dL — ABNORMAL LOW (ref 70–99)
Glucose-Capillary: 88 mg/dL (ref 70–99)
Glucose-Capillary: 146 mg/dL — ABNORMAL HIGH (ref 70–99)
Glucose-Capillary: 110 mg/dL — ABNORMAL HIGH (ref 70–99)
Glucose-Capillary: 176 mg/dL — ABNORMAL HIGH (ref 70–99)

## 2020-12-26 LAB — COMPREHENSIVE METABOLIC PANEL
ALT: 57 U/L — ABNORMAL HIGH (ref 0–44)
AST: 29 U/L (ref 15–41)
Albumin: 3 g/dL — ABNORMAL LOW (ref 3.5–5.0)
Alkaline Phosphatase: 153 U/L — ABNORMAL HIGH (ref 38–126)
Anion gap: 8 (ref 5–15)
BUN: 21 mg/dL — ABNORMAL HIGH (ref 6–20)
CO2: 27 mmol/L (ref 22–32)
Calcium: 8.7 mg/dL — ABNORMAL LOW (ref 8.9–10.3)
Chloride: 108 mmol/L (ref 98–111)
Creatinine, Ser: 1.14 mg/dL (ref 0.61–1.24)
GFR, Estimated: >60 mL/min (ref >60)
Glucose, Bld: 86 mg/dL (ref 70–99)
Potassium: 3.5 mmol/L (ref 3.5–5.1)
Sodium: 143 mmol/L (ref 135–145)
Total Bilirubin: 1.1 mg/dL (ref 0.3–1.2)
Total Protein: 5.6 g/dL — ABNORMAL LOW (ref 6.5–8.1)

## 2020-12-26 LAB — ECHOCARDIOGRAM COMPLETE
Calc EF: 20.3 %
Height: 72 in
MV M vel: 4.03 m/s
MV Peak grad: 65 mmHg
Radius: 1 cm
S' Lateral: 5.6 cm
Single Plane A2C EF: 16.8 %
Single Plane A4C EF: 19.4 %
Weight: 3118.19 oz

## 2020-12-26 LAB — CBC
HCT: 35.5 % — ABNORMAL LOW (ref 39.0–52.0)
Hemoglobin: 10.6 g/dL — ABNORMAL LOW (ref 13.0–17.0)
MCH: 30.4 pg (ref 26.0–34.0)
MCHC: 29.9 g/dL — ABNORMAL LOW (ref 30.0–36.0)
MCV: 101.7 fL — ABNORMAL HIGH (ref 80.0–100.0)
Platelets: 206 10*3/uL (ref 150–400)
RBC: 3.49 MIL/uL — ABNORMAL LOW (ref 4.22–5.81)
RDW: 17.4 % — ABNORMAL HIGH (ref 11.5–15.5)
WBC: 9.2 10*3/uL (ref 4.0–10.5)
nRBC: 0.2 % (ref 0.0–0.2)

## 2020-12-26 LAB — SARS CORONAVIRUS 2 (TAT 6-24 HRS): SARS Coronavirus 2: NEGATIVE

## 2020-12-26 LAB — HIV ANTIBODY (ROUTINE TESTING W REFLEX): HIV Screen 4th Generation wRfx: NONREACTIVE

## 2020-12-26 IMAGING — DX DG FEMUR 2+V*L*
4 series · 4 of 4 positions shown · non-contrast
Comparison: [DATE]

CLINICAL DATA: Fracture follow-up

EXAM:
LEFT FEMUR 2 VIEWS

[femur ap (1 of 2)]
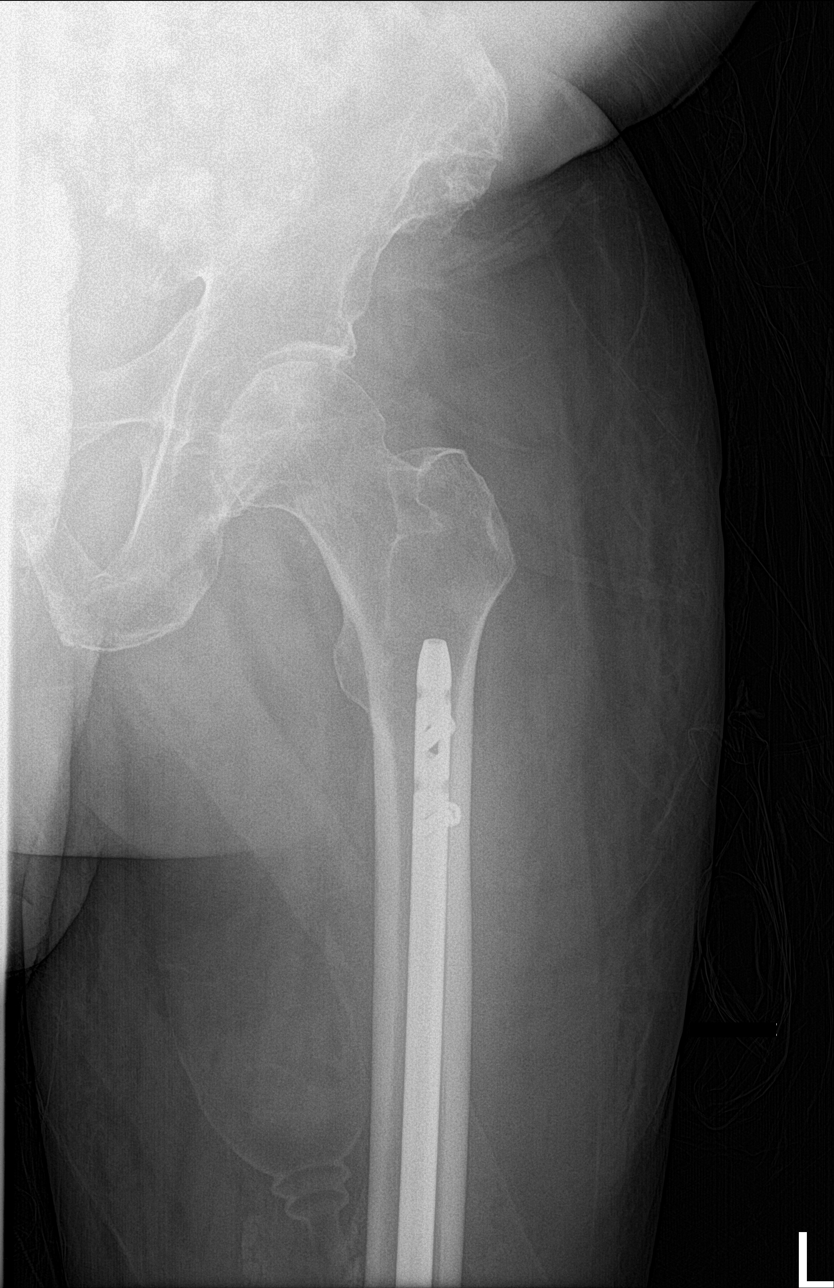

[femur ap (2 of 2)]
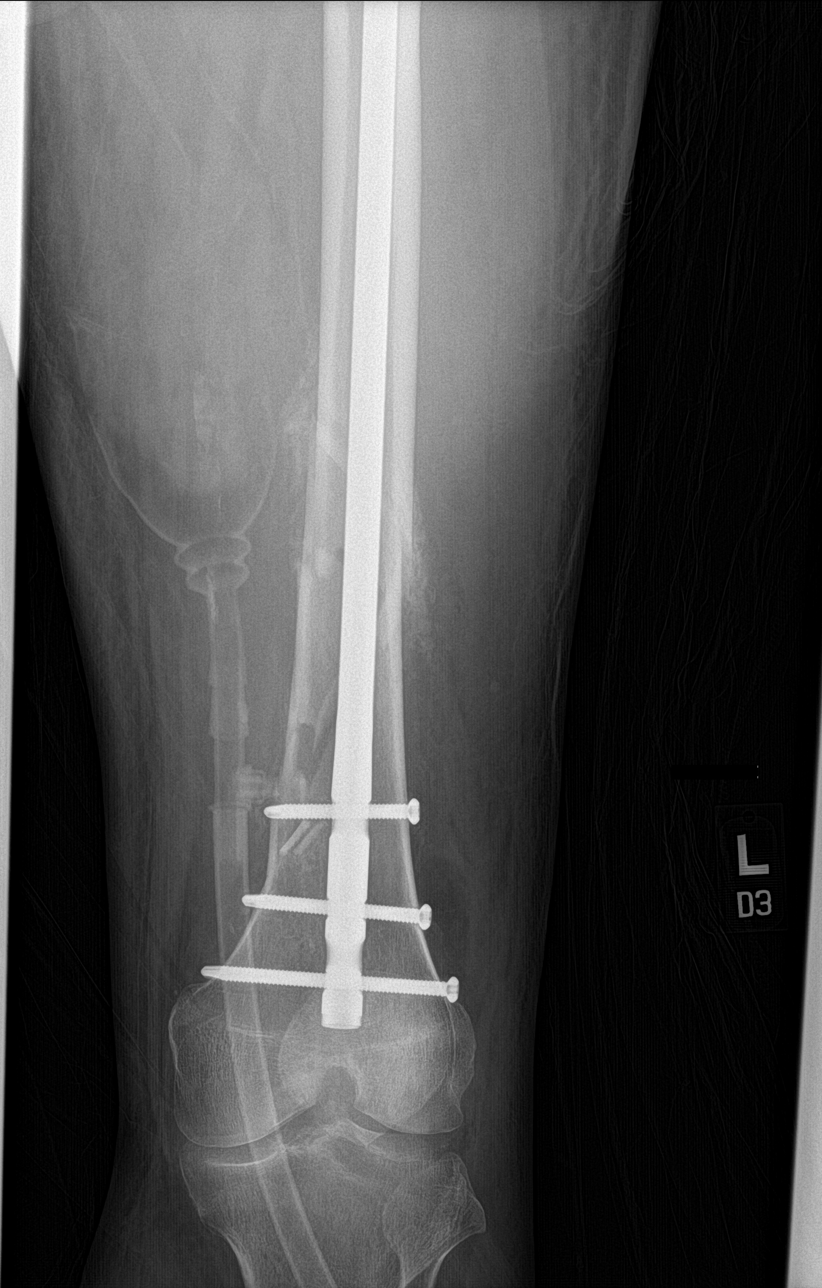

[femur lat (1 of 2)]
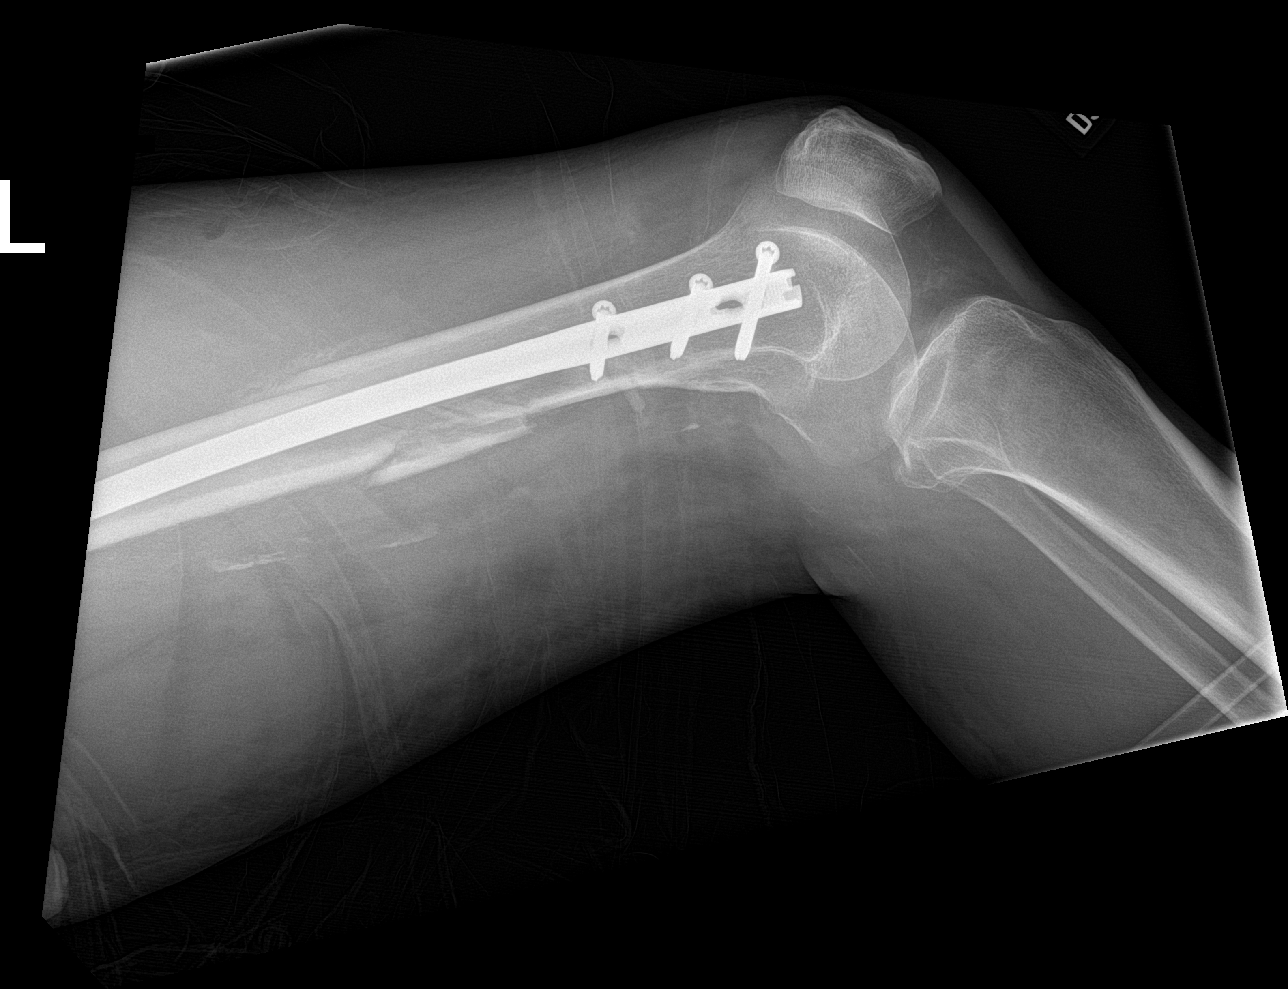

[femur lat (2 of 2)]
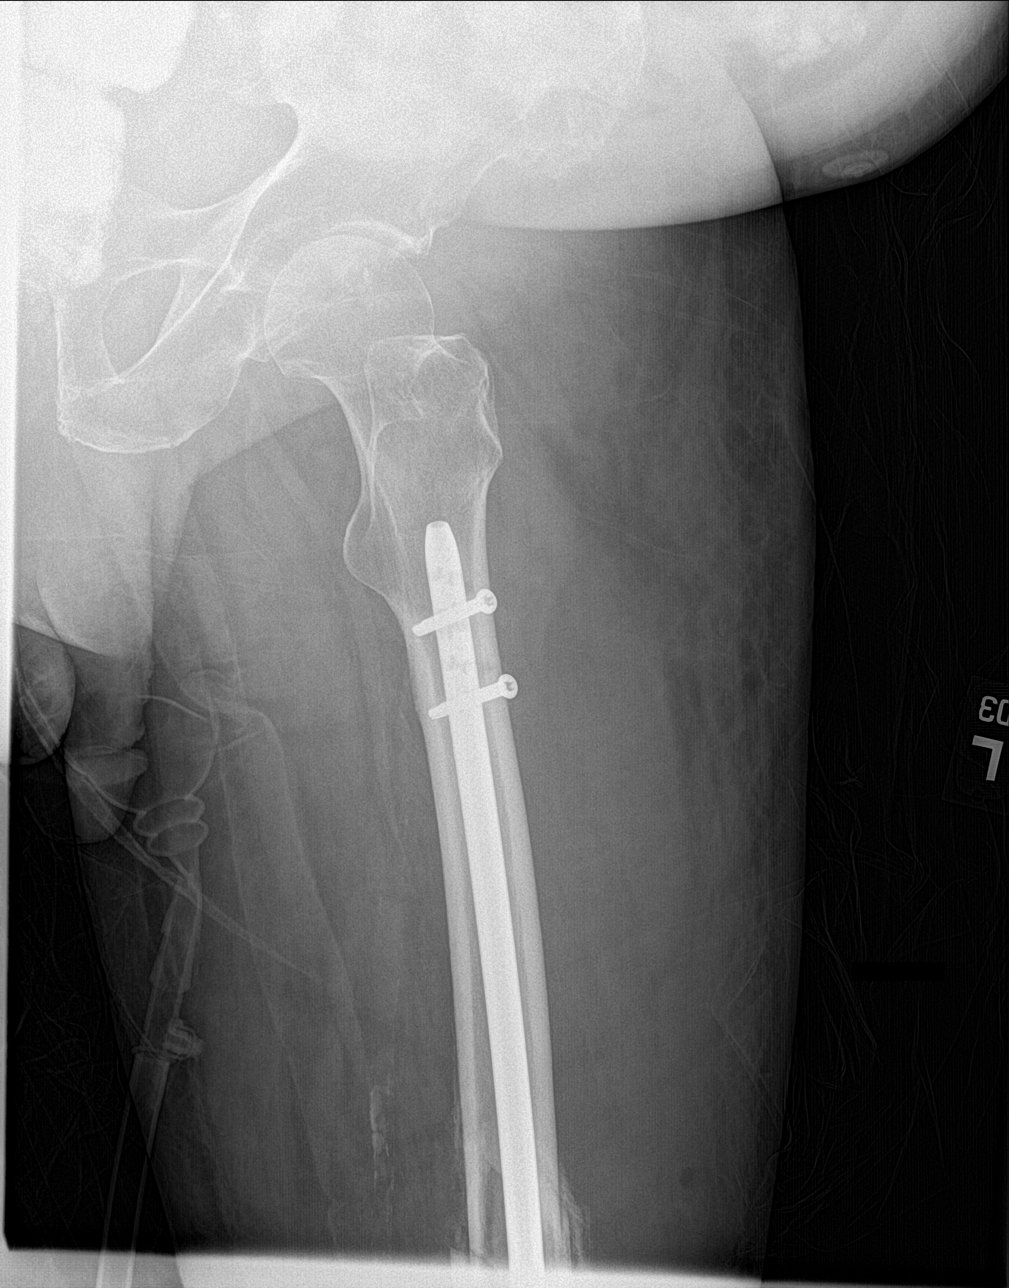

[4 of 4 positions shown; findings below may reference images not displayed]

FINDINGS: Comminuted fracture distal femur unchanged. There is periosteal new
bone formation around the fracture compatible with interval healing.
ORIF with locking intramedullary rod extending into the distal
femur. Rod in satisfactory position. No complications.
IMPRESSION: Early healing of fracture distal femur. Intramedullary rod in good
position.

## 2020-12-26 MED ORDER — POTASSIUM CHLORIDE CRYS ER 20 MEQ PO TBCR
20.0000 meq | EXTENDED_RELEASE_TABLET | Freq: Once | ORAL | Status: AC
  Administered 2020-12-26: 20 meq via ORAL
  Filled 2020-12-26: qty 1

## 2020-12-26 MED ORDER — IPRATROPIUM-ALBUTEROL 0.5-2.5 (3) MG/3ML IN SOLN
3.0000 mL | RESPIRATORY_TRACT | Status: DC | PRN
  Administered 2020-12-26 – 2020-12-29 (×3): 3 mL via RESPIRATORY_TRACT
  Filled 2020-12-26 (×4): qty 3

## 2020-12-26 MED ORDER — MAGNESIUM SULFATE IN D5W 1-5 GM/100ML-% IV SOLN
1.0000 g | Freq: Once | INTRAVENOUS | Status: AC
  Administered 2020-12-26: 1 g via INTRAVENOUS
  Filled 2020-12-26: qty 100

## 2020-12-26 MED ORDER — IPRATROPIUM-ALBUTEROL 20-100 MCG/ACT IN AERS
1.0000 | INHALATION_SPRAY | Freq: Four times a day (QID) | RESPIRATORY_TRACT | Status: DC | PRN
  Administered 2020-12-26: 1 via RESPIRATORY_TRACT
  Filled 2020-12-26: qty 4

## 2020-12-26 NOTE — TOC Initial Note (Addendum)
Transition of Care (TOC) - Initial/Assessment Note    Patient Details  Name: Dennis [name]. MRN: [id number] Date of Birth: 07-31-1961  Transition of [facility]) CM/SW Contact:    Dessa Phi, RN Phone Number: 12/26/2020, 2:43 PM  Clinical Narrative: Patient/brother [name]-d/c plan home-receives HHC unsure of agency-will check. Recc PT eval. Has own transport for home. 3:47p-Active w/[facility] HHRN/PT rep Amy aware.                  Expected Discharge Plan: [place] Barriers to Discharge: Continued Medical Work up   Patient Goals and CMS Choice Patient states their goals for this hospitalization and ongoing recovery are:: go home CMS Medicare.gov Compare Post Acute Care list provided to:: Patient Choice offered to / list presented to : Patient  Expected Discharge Plan and Services Expected Discharge Plan: [place]   Discharge Planning Services: CM Consult Post Acute Care Choice: [place] arrangements for the past 2 months: Single Family Home                                      Prior Living Arrangements/Services Living arrangements for the past 2 months: Single Family Home Lives with:: Siblings,Parents Patient language and need for interpreter reviewed:: Yes Do you feel safe going back to the place where you live?: Yes      Need for Family Participation in Patient Care: No (Comment) Care giver support system in place?: Yes (comment) Current home services: DME (rw,w/c;HH unsure of agency) Criminal Activity/Legal Involvement Pertinent to Current Situation/Hospitalization: No - Comment as needed  Activities of Daily Living Home Assistive Devices/Equipment: Hospital bed,Walker (specify type) ADL Screening (condition at time of admission) Patient's cognitive ability adequate to safely complete daily activities?: Yes Is the patient deaf or have difficulty hearing?: No Does the patient have difficulty seeing, even when  wearing glasses/contacts?: No Does the patient have difficulty concentrating, remembering, or making decisions?: No Patient able to express need for assistance with ADLs?: Yes Does the patient have difficulty dressing or bathing?: No Independently performs ADLs?: Yes (appropriate for developmental age) Does the patient have difficulty walking or climbing stairs?: Yes Weakness of Legs: Both Weakness of Arms/Hands: Both  Permission Sought/Granted Permission sought to share information with : Case Manager Permission granted to share information with : Yes, Verbal Permission Granted  Share Information with NAME: Case Manager     Permission granted to share info w Relationship: [name] brother [phone]     Emotional Assessment Appearance:: Appears stated age Attitude/Demeanor/Rapport: Gracious Affect (typically observed): Accepting Orientation: : Oriented to Self,Oriented to Place,Oriented to  Time,Oriented to Situation Alcohol / Substance Use: Illicit Drugs Psych Involvement: No (comment)  Admission diagnosis:  Acute exacerbation of CHF (congestive heart failure) (HCC) [I50.9] Acute on chronic congestive heart failure, unspecified heart failure type [facility]) [I50.9] Patient Active Problem List   Diagnosis Date Noted  . Acute exacerbation of CHF (congestive heart failure) ([place]) 12/25/2020  . Femoral distal fracture ([place]) 10/30/2020  . Fall 10/30/2020  . COVID-19 virus infection 10/30/2020  . Brain metastases ([place]) 08/27/2020  . Malignant neoplasm of lung ([place])   . Orthostatic hypotension   . Syncope 07/09/2020  . Slurred speech 07/08/2020  . Uncontrolled type 2 diabetes mellitus with hyperglycemia, with long-term current use of insulin ([place]) 07/08/2020  . Unsteady gait when walking 07/07/2020  . Small cell lung  cancer, right upper lobe ([place]) 01/19/2020  . Encounter for antineoplastic chemotherapy 01/19/2020  . Encounter for antineoplastic immunotherapy 01/19/2020  . 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 Goals of care, counseling/discussion 01/04/2020  . Mediastinal adenopathy   . Mass of upper lobe of right lung   . Brain mass 12/23/2019  . Vasogenic cerebral edema ([place]) 12/23/2019  . Left leg paresthesias   . History of CVA with residual deficit   . Acute on chronic systolic congestive heart failure ([place])   . Left-sided weakness   . 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 Cocaine abuse ([place]) 01/24/2015  . Essential hypertension 01/24/2015  . Dysphagia 01/24/2015  . Left hemiplegia ([place]) 01/24/2015  . Hyperlipidemia associated with type 2 diabetes mellitus ([place]) 11/30/2014  . Chronic systolic heart failure ([place]) 06/20/2014  . Automatic implantable cardioverter-defibrillator in situ   . Drug abuse and dependence ([place])   . Cardiomyopathy, ischemic 07/21/2011  . Type II diabetes mellitus with ophthalmic manifestations ([place]) 09/13/2007   PCP:  [name], MD Pharmacy:   [facility] [place] [name] [place] [address] Phone: [phone] Fax: [phone]     Social Determinants of Health (SDOH) Interventions    Readmission Risk Interventions No flowsheet data found.

## 2020-12-26 NOTE — Progress Notes (Signed)
ORTHOPAEDIC TRAUMA PROGRESS NOTE  Patient was admitted on 12/25/20 for severe SOB while being seen by provider at [facility]. Patient is 8 weeks out s/p retrograde intramedullary nailing left femur fracture by Dr. Doreatha Martin. OTS asked to see patient while in hospital. I have reviewed repeat imaging of left femur that was performed today. Intramedullary nail in good position without signs of hardware failure or loosening. Fracture appropropiately aligned. Notable callus on both the medial and lateral cortex. I will plan to come by and evaluate patient prior to discharge.   Kourtney Montesinos A. [facility] [phone] (office) [email or web address]

## 2020-12-26 NOTE — Progress Notes (Signed)
PROGRESS NOTE    Dennis [name].  NLZ:[id number] DOB: Apr 05, 1961 DOA: 12/25/2020 PCP: [name], MD    Brief Narrative:  Dennis Rozario. is a 60 year old male with past medical history significant for chronic combined systolic and diastolic congestive heart failure s/p AICD, small cell lung cancer with brain metastasis, history of cocaine use, essential hypertension, orthostasis, CVA with left-sided hemiplegia, hyperlipidemia, type 2 diabetes mellitus, left lower extremity DVT who presented to the ED with progressive shortness of breath and lower extremity edema.  Patient sent in by his oncologist during routine visit for shortness of breath and leg edema.  Onset roughly 1 week ago with associated orthopnea.  In the ED, temperature 97.7 F, HR 104, RR 26, BP 125/94, SPO2 93% on 2 L nasal cannula.  Sodium 143, potassium 3.5, chloride 109, CO2 27, glucose 103, BUN 22, creatinine of 1.23.  AST 31, ALT 67, total bilirubin 1.1.  BNP 1073.  WBC 10.2, hemoglobin 10.9, platelets 212.  Chest x-ray with persistent diffuse increased interstitial markings compatible with interstitial edema, percent pleural effusions with opacification of the lower lung zones, asymmetric airspace opacification LLL which reflects pneumonia versus atelectasis.  EKG with sinus tachycardia.  Hospitalist service consulted for further evaluation and management of acute on chronic combined congestive heart failure.   Assessment & Plan:   Principal Problem:   Acute on chronic systolic congestive heart failure (HCC) Active Problems:   Automatic implantable cardioverter-defibrillator in situ   Hyperlipidemia associated with type 2 diabetes mellitus (HCC)   Left hemiplegia (HCC)   History of CVA with residual deficit   Small cell lung cancer, right upper lobe ([place])   Uncontrolled type 2 diabetes mellitus with hyperglycemia, with long-term current use of insulin (HCC)   Brain metastases (HCC)   Acute hypoxic respiratory  failure, POA Acute on chronic combined systolic/diastolic congestive heart failure Patient presenting with progressive dyspnea, lower extremity edema for 1-2 weeks.  Also associated 20 pound weight gain.  Patient was noted to have diffuse interstitial markings consistent with interstitial edema on chest x-ray with elevated BNP of 1073.  Patient was noted to be hypoxic requiring supplemental oxygen.  TTE 07/08/2020 with LVEF 25-30%, LV with decreased function and global hypokinesis, grade 1 diastolic dysfunction. --net negative 762mL past 24h --Continue furosemide 40 mg IV q12h --1500 mL fluid restriction --Continue supplemental oxygen, maintain SPO2 greater than 92% --Strict I's and O's and daily weights  Left lower extremity DVT Previously on Coumadin, changed to Xarelto recently by oncology. --Continue Xarelto  Small cell lung cancer with brain metastases Follows with medical oncology, [name] outpatient.  Currently on systemic chemotherapy with carboplatin, etoposide, Imfinzi.  CT chest with enlargement of spiculated nodule right middle lobe, worsens with new enlarged pretracheal/subcarinal lymph nodes. --Decadron 1 mg p.o. daily --Outpatient follow-up with medical oncology  Recent left femur fracture Patient underwent IM nail to left femur fracture by Dr. Doreatha Martin on 10/31/2020.  Patient states has appointment with orthopedics outpatient. --Orthopedics consulted for evaluation and possible need for follow-up x-rays  Orthostatic hypotension --Midodrine 10 mg p.o. daily  Type 2 diabetes mellitus Home regimen includes Lantus 25 units BID, Humalog 6 units BID. --SSI for coverage --CBGs qAC/HS  BPH: Tamsulosin 0.4 mg p.o. daily   DVT prophylaxis: Xarelto   Code Status: Full Code Family Communication: No family present at bedside  Disposition Plan:  Level of care: Telemetry Status is: Inpatient  Remains inpatient appropriate because:Ongoing diagnostic testing needed not  appropriate for outpatient work up,  Unsafe d/c plan, IV treatments appropriate due to intensity of illness or inability to take PO and Inpatient level of care appropriate due to severity of illness   Dispo: The patient is from: Home              Anticipated d/c is to: Home              Patient currently is not medically stable to d/c.   Difficult to place patient No   Consultants:   none  Procedures:   None  Antimicrobials:   None   Subjective: Patient seen and examined bedside, resting comfortably.  Continues with shortness of breath and significant lower extremity edema.  Patient reports good urine output with IV furosemide.  Remains on supplemental oxygen, not oxygen dependent at baseline.  No other questions or concerns at this time.  Denies headache, no visual changes, no chest pain, no palpitations, no abdominal pain, no fever/chills/night sweats, no nausea/vomiting/diarrhea.  No acute events overnight per nursing staff.  Objective: Vitals:   12/26/20 0908 12/26/20 0950 12/26/20 1150 12/26/20 1402  BP:  108/83 119/85 116/89  Pulse:  97 (!) 102 (!) 102  Resp:  (!) 22 (!) 27 (!) 22  Temp:  98.7 F (37.1 C) 98 F (36.7 C) 98 F (36.7 C)  TempSrc:  Oral Oral Oral  SpO2: 96% 96% 100% 100%  Weight:      Height:        Intake/Output Summary (Last 24 hours) at 12/26/2020 1514 Last data filed at 12/26/2020 1408 Gross per 24 hour  Intake 1600 ml  Output 1575 ml  Net 25 ml   Filed Weights   12/25/20 1637 12/25/20 2128 12/26/20 0634  Weight: 90.7 kg 88.5 kg 88.4 kg    Examination:  General exam: Appears calm and comfortable  Respiratory system: Decreased breath sounds bilateral bases with crackles, normal respiratory effort without accessory muscle use, on 3 L nasal cannula with SPO2 93% (not oxygen dependent at baseline) Cardiovascular system: S1 & S2 heard, RRR. No JVD, murmurs, rubs, gallops or clicks. No pedal edema. Gastrointestinal system: Abdomen is  nondistended, soft and nontender. No organomegaly or masses felt. Normal bowel sounds heard. Central nervous system: Alert and oriented. No focal neurological deficits. Extremities: Symmetric 5 x 5 power. Skin: No rashes, lesions or ulcers Psychiatry: Judgement and insight appear normal. Mood & affect appropriate.     Data Reviewed: I have personally reviewed following labs and imaging studies  CBC: Recent Labs  Lab 12/25/20 1726 12/26/20 0612  WBC 10.2 9.2  NEUTROABS 8.0*  --   HGB 10.9* 10.6*  HCT 36.3* 35.5*  MCV 101.7* 101.7*  PLT 212 161   Basic Metabolic Panel: Recent Labs  Lab 12/25/20 1726 12/25/20 1851 12/26/20 0612  NA 143  --  143  K 3.5  --  3.5  CL 109  --  108  CO2 27  --  27  GLUCOSE 103*  --  86  BUN 22*  --  21*  CREATININE 1.23  --  1.14  CALCIUM 8.6*  --  8.7*  MG  --  1.7  --    GFR: Estimated Creatinine Clearance: 77.5 mL/min (by C-G formula based on SCr of 1.14 mg/dL). Liver Function Tests: Recent Labs  Lab 12/25/20 1726 12/26/20 0612  AST 31 29  ALT 67* 57*  ALKPHOS 163* 153*  BILITOT 1.1 1.1  PROT 6.0* 5.6*  ALBUMIN 3.0* 3.0*   No results for input(s): LIPASE, AMYLASE  in the last 168 hours. No results for input(s): AMMONIA in the last 168 hours. Coagulation Profile: Recent Labs  Lab 12/25/20 0000  INR 2.0   Cardiac Enzymes: No results for input(s): CKTOTAL, CKMB, CKMBINDEX, TROPONINI in the last 168 hours. BNP (last 3 results) No results for input(s): PROBNP in the last 8760 hours. HbA1C: No results for input(s): HGBA1C in the last 72 hours. CBG: Recent Labs  Lab 12/25/20 2149 12/25/20 2254 12/26/20 0810 12/26/20 0833 12/26/20 1159  GLUCAP 67* 146* 60* 88 146*   Lipid Profile: No results for input(s): CHOL, HDL, LDLCALC, TRIG, CHOLHDL, LDLDIRECT in the last 72 hours. Thyroid Function Tests: No results for input(s): TSH, T4TOTAL, FREET4, T3FREE, THYROIDAB in the last 72 hours. Anemia Panel: No results for  input(s): VITAMINB12, FOLATE, FERRITIN, TIBC, IRON, RETICCTPCT in the last 72 hours. Sepsis Labs: No results for input(s): PROCALCITON, LATICACIDVEN in the last 168 hours.  Recent Results (from the past 240 hour(s))  SARS CORONAVIRUS 2 (TAT 6-24 HRS) Nasopharyngeal Nasopharyngeal Swab     Status: None   Collection Time: 12/25/20  7:24 PM   Specimen: Nasopharyngeal Swab  Result Value Ref Range Status   SARS Coronavirus 2 NEGATIVE NEGATIVE Final    Comment: (NOTE) SARS-CoV-2 target nucleic acids are NOT DETECTED.  The SARS-CoV-2 RNA is generally detectable in upper and lower respiratory specimens during the acute phase of infection. Negative results do not preclude SARS-CoV-2 infection, do not rule out co-infections with other pathogens, and should not be used as the sole basis for treatment or other patient management decisions. Negative results must be combined with clinical observations, patient history, and epidemiological information. The expected result is Negative.  Fact Sheet for Patients: SugarRoll.be  Fact Sheet for Healthcare Providers: https://www.woods-mathews.com/  This test is not yet approved or cleared by the [place] FDA and  has been authorized for detection and/or diagnosis of SARS-CoV-2 by FDA under an Emergency Use Authorization (EUA). This EUA will remain  in effect (meaning this test can be used) for the duration of the COVID-19 declaration under Se ction 564(b)(1) of the Act, 21 U.S.C. section 360bbb-3(b)(1), unless the authorization is terminated or revoked sooner.  Performed at [facility], [place] [address], [place], [place] [address]          Radiology Studies: CT Chest W Contrast  Result Date: 12/25/2020 CLINICAL DATA:  Metastatic small-cell lung cancer restaging, known brain metastatic disease EXAM: CT CHEST, ABDOMEN, AND PELVIS WITH CONTRAST TECHNIQUE: Multidetector CT imaging of the chest,  abdomen and pelvis was performed following the standard protocol during bolus administration of intravenous contrast. CONTRAST:  125mL OMNIPAQUE IOHEXOL 300 MG/ML SOLN, additional oral enteric contrast COMPARISON:  09/17/2020 FINDINGS: CT CHEST FINDINGS Cardiovascular: Right chest port catheter. Aortic atherosclerosis. Cardiomegaly. Three-vessel coronary artery calcifications. No pericardial effusion. Mediastinum/Nodes: Newly enlarged pretracheal and subcarinal lymph nodes, largest pretracheal nodes measuring 2.4 x 1.7 cm (series 2, image 22). Thyroid gland, trachea, and esophagus demonstrate no significant findings. Lungs/Pleura: New small bilateral pleural effusions with associated atelectasis or consolidation. Moderate centrilobular emphysema. Interval enlargement of a spiculated nodule of the posterolateral segment of the right middle lobe, abutting the major fissure, measuring 1.6 x 1.0 cm, previously 1.1 x 0.7 cm when measured similarly (series 4, image 90). Is diffuse bilateral interlobular septal thickening. Diffuse bilateral bronchial wall thickening. Musculoskeletal: No chest wall mass or suspicious bone lesions identified. CT ABDOMEN PELVIS FINDINGS Hepatobiliary: No focal liver abnormality is seen. Status post cholecystectomy. No biliary dilatation.  Pancreas: Unremarkable. No pancreatic ductal dilatation or surrounding inflammatory changes. Spleen: Normal in size without significant abnormality. Adrenals/Urinary Tract: Stable low-attenuation nodule of the right adrenal gland measuring 2.7 cm, previously non FDG avid and characterized as a benign adenoma (series 2, image 60). Punctuate nonobstructive calculus of the inferior pole of the right kidney. No hydronephrosis bladder is unremarkable. Stomach/Bowel: Stomach is within normal limits. Appendix appears normal. No evidence of bowel wall thickening, distention, or inflammatory changes. Vascular/Lymphatic: Aortic atherosclerosis. No enlarged abdominal or  pelvic lymph nodes. Reproductive: No mass or other abnormality. Other: No abdominal wall hernia or abnormality. No abdominopelvic ascites. Musculoskeletal: No acute or significant osseous findings. IMPRESSION: 1. Interval enlargement of a spiculated nodule of the posterolateral segment of the right middle lobe, consistent with worsened primary lung malignancy. 2. Newly enlarged pretracheal and subcarinal lymph nodes, consistent with nodal metastatic disease. 3. New small bilateral pleural effusions with associated atelectasis or consolidation. There is diffuse bilateral interlobular septal thickening. These findings suggest pulmonary edema but lymphangitic metastatic disease can produce this appearance. No obvious pleural nodularity identified. 4. No evidence of metastatic disease within the abdomen or pelvis. 5. Emphysema. 6. Coronary artery disease. Aortic Atherosclerosis (ICD10-I70.0) and Emphysema (ICD10-J43.9). Electronically Signed   By: Eddie Candle M.D.   On: 12/25/2020 10:05   CT Abdomen Pelvis W Contrast  Result Date: 12/25/2020 CLINICAL DATA:  Metastatic small-cell lung cancer restaging, known brain metastatic disease EXAM: CT CHEST, ABDOMEN, AND PELVIS WITH CONTRAST TECHNIQUE: Multidetector CT imaging of the chest, abdomen and pelvis was performed following the standard protocol during bolus administration of intravenous contrast. CONTRAST:  15mL OMNIPAQUE IOHEXOL 300 MG/ML SOLN, additional oral enteric contrast COMPARISON:  09/17/2020 FINDINGS: CT CHEST FINDINGS Cardiovascular: Right chest port catheter. Aortic atherosclerosis. Cardiomegaly. Three-vessel coronary artery calcifications. No pericardial effusion. Mediastinum/Nodes: Newly enlarged pretracheal and subcarinal lymph nodes, largest pretracheal nodes measuring 2.4 x 1.7 cm (series 2, image 22). Thyroid gland, trachea, and esophagus demonstrate no significant findings. Lungs/Pleura: New small bilateral pleural effusions with associated  atelectasis or consolidation. Moderate centrilobular emphysema. Interval enlargement of a spiculated nodule of the posterolateral segment of the right middle lobe, abutting the major fissure, measuring 1.6 x 1.0 cm, previously 1.1 x 0.7 cm when measured similarly (series 4, image 90). Is diffuse bilateral interlobular septal thickening. Diffuse bilateral bronchial wall thickening. Musculoskeletal: No chest wall mass or suspicious bone lesions identified. CT ABDOMEN PELVIS FINDINGS Hepatobiliary: No focal liver abnormality is seen. Status post cholecystectomy. No biliary dilatation. Pancreas: Unremarkable. No pancreatic ductal dilatation or surrounding inflammatory changes. Spleen: Normal in size without significant abnormality. Adrenals/Urinary Tract: Stable low-attenuation nodule of the right adrenal gland measuring 2.7 cm, previously non FDG avid and characterized as a benign adenoma (series 2, image 60). Punctuate nonobstructive calculus of the inferior pole of the right kidney. No hydronephrosis bladder is unremarkable. Stomach/Bowel: Stomach is within normal limits. Appendix appears normal. No evidence of bowel wall thickening, distention, or inflammatory changes. Vascular/Lymphatic: Aortic atherosclerosis. No enlarged abdominal or pelvic lymph nodes. Reproductive: No mass or other abnormality. Other: No abdominal wall hernia or abnormality. No abdominopelvic ascites. Musculoskeletal: No acute or significant osseous findings. IMPRESSION: 1. Interval enlargement of a spiculated nodule of the posterolateral segment of the right middle lobe, consistent with worsened primary lung malignancy. 2. Newly enlarged pretracheal and subcarinal lymph nodes, consistent with nodal metastatic disease. 3. New small bilateral pleural effusions with associated atelectasis or consolidation. There is diffuse bilateral interlobular septal thickening. These findings suggest pulmonary edema but lymphangitic  metastatic disease can  produce this appearance. No obvious pleural nodularity identified. 4. No evidence of metastatic disease within the abdomen or pelvis. 5. Emphysema. 6. Coronary artery disease. Aortic Atherosclerosis (ICD10-I70.0) and Emphysema (ICD10-J43.9). Electronically Signed   By: Eddie Candle M.D.   On: 12/25/2020 10:05   DG Chest Port 1 View  Result Date: 12/25/2020 CLINICAL DATA:  Shortness of breath. EXAM: PORTABLE CHEST 1 VIEW COMPARISON:  CT chest 12/24/2020. FINDINGS: Right chest wall port a catheter tip is at the cavoatrial junction. There is a left chest wall pacer device with leads in the right atrial appendage and right ventricle. Aortic atherosclerosis. Diffusely increased interstitial markings identified compatible with pulmonary edema. Small pleural effusions with veil like opacification of the lower lobes identified. Left lung base airspace disease. Chronic interstitial changes of COPD/emphysema. IMPRESSION: 1. Persistent diffuse increase interstitial markings compatible with interstitial edema. Persistent pleural effusions with veil like opacification of the lower lung zones. 2. Asymmetric airspace opacification within the left lower lobe which may reflect pneumonia and or atelectasis. Electronically Signed   By: Kerby Moors M.D.   On: 12/25/2020 16:58   ECHOCARDIOGRAM COMPLETE  Result Date: 12/26/2020    ECHOCARDIOGRAM REPORT   Patient Name:   Dennis Diltz. Date of Exam: 12/26/2020 Medical Rec #:  [id number]       Height:       72.0 in Accession #:    [id number]      Weight:       194.9 lb Date of Birth:  13-Jun-1961        BSA:          2.107 m Patient Age:    17 years        BP:           108/83 mmHg Patient Gender: M               HR:           102 bpm. Exam Location:  Inpatient Procedure: 2D Echo Indications:    Dyspnea R06.00  History:        Patient has prior history of Echocardiogram examinations, most                 recent 07/09/2020. CHF, CAD, Signs/Symptoms:Murmur; Risk                  Factors:Diabetes and Drug abuse.  Sonographer:    Mikki Santee RDCS (AE) Referring Phys: [id number] [place]  1. Left ventricular ejection fraction, by estimation, is 15-20%. The left ventricle has severely decreased function. The left ventricle demonstrates global hypokinesis. The left ventricular internal cavity size was moderately to severely dilated. Indeterminate diastolic filling due to E-A fusion.  2. Right ventricular systolic function is moderately reduced. The right ventricular size is moderately enlarged. There is normal pulmonary artery systolic pressure. The estimated right ventricular systolic pressure is 18.8 mmHg.  3. Left atrial size was moderately dilated.  4. Right atrial size was moderately dilated.  5. The mitral valve is grossly normal. Severe mitral valve regurgitation. No evidence of mitral stenosis.  6. Tricuspid valve regurgitation is mild to moderate.  7. The aortic valve is tricuspid. Aortic valve regurgitation is trivial. No aortic stenosis is present.  8. The inferior vena cava is dilated in size with >50% respiratory variability, suggesting right atrial pressure of 8 mmHg. Comparison(s): Changes from prior study are noted. EF is now 15-20%. Severe MR is present. FINDINGS  Left  Ventricle: Left ventricular ejection fraction, by estimation, is 15-20%. The left ventricle has severely decreased function. The left ventricle demonstrates global hypokinesis. The left ventricular internal cavity size was moderately to severely dilated. There is no left ventricular hypertrophy. Indeterminate diastolic filling due to E-A fusion. Right Ventricle: The right ventricular size is moderately enlarged. No increase in right ventricular wall thickness. Right ventricular systolic function is moderately reduced. There is normal pulmonary artery systolic pressure. The tricuspid regurgitant velocity is 2.17 m/s, and with an assumed right atrial pressure of 8 mmHg, the estimated right  ventricular systolic pressure is 65.4 mmHg. Left Atrium: Left atrial size was moderately dilated. Right Atrium: Right atrial size was moderately dilated. Pericardium: Trivial pericardial effusion is present. Mitral Valve: The mitral valve is grossly normal. Severe mitral valve regurgitation. No evidence of mitral valve stenosis. Tricuspid Valve: The tricuspid valve is grossly normal. Tricuspid valve regurgitation is mild to moderate. No evidence of tricuspid stenosis. Aortic Valve: The aortic valve is tricuspid. Aortic valve regurgitation is trivial. No aortic stenosis is present. Pulmonic Valve: The pulmonic valve was grossly normal. Pulmonic valve regurgitation is mild. No evidence of pulmonic stenosis. Aorta: The aortic root and ascending aorta are structurally normal, with no evidence of dilitation. Venous: The inferior vena cava is dilated in size with greater than 50% respiratory variability, suggesting right atrial pressure of 8 mmHg. IAS/Shunts: The atrial septum is grossly normal. Additional Comments: A device lead is visualized in the right atrium and right ventricle.  LEFT VENTRICLE PLAX 2D LVIDd:         6.50 cm      Diastology LVIDs:         5.60 cm      LV e' medial:  4.43 cm/s LV PW:         1.20 cm      LV e' lateral: 4.65 cm/s LV IVS:        0.90 cm LVOT diam:     2.20 cm LV SV:         29 LV SV Index:   14 LVOT Area:     3.80 cm  LV Volumes (MOD) LV vol d, MOD A2C: 196.0 ml LV vol d, MOD A4C: 160.0 ml LV vol s, MOD A2C: 163.0 ml LV vol s, MOD A4C: 129.0 ml LV SV MOD A2C:     33.0 ml LV SV MOD A4C:     160.0 ml LV SV MOD BP:      37.3 ml RIGHT VENTRICLE RV S prime:     6.98 cm/s TAPSE (M-mode): 1.4 cm LEFT ATRIUM              Index       RIGHT ATRIUM           Index LA diam:        4.40 cm  2.09 cm/m  RA Area:     25.60 cm LA Vol (A2C):   108.0 ml 51.25 ml/m RA Volume:   92.90 ml  44.09 ml/m LA Vol (A4C):   84.2 ml  39.96 ml/m LA Biplane Vol: 94.9 ml  45.04 ml/m  AORTIC VALVE LVOT Vmax:    58.10 cm/s LVOT Vmean:  38.600 cm/s LVOT VTI:    0.077 m  AORTA Ao Root diam: 3.50 cm MR Peak grad:    65.0 mmHg   TRICUSPID VALVE MR Mean grad:    41.0 mmHg   TR Peak grad:   18.8 mmHg MR Vmax:  403.00 cm/s TR Vmax:        217.00 cm/s MR Vmean:        302.0 cm/s MR PISA:         6.28 cm    SHUNTS MR PISA Eff ROA: 59 mm      Systemic VTI:  0.08 m MR PISA Radius:  1.00 cm     Systemic Diam: 2.20 cm Eleonore Chiquito MD Electronically signed by Eleonore Chiquito MD Signature Date/Time: 12/26/2020/1:41:55 PM    Final    DG FEMUR MIN 2 VIEWS LEFT  Result Date: 12/26/2020 CLINICAL DATA:  Fracture follow-up EXAM: LEFT FEMUR 2 VIEWS COMPARISON:  11/13/2020 FINDINGS: Comminuted fracture distal femur unchanged. There is periosteal new bone formation around the fracture compatible with interval healing. ORIF with locking intramedullary rod extending into the distal femur. Rod in satisfactory position. No complications. IMPRESSION: Early healing of fracture distal femur. Intramedullary rod in good position. Electronically Signed   By: Franchot Gallo M.D.   On: 12/26/2020 13:33        Scheduled Meds: . atorvastatin  20 mg Oral Daily  . Chlorhexidine Gluconate Cloth  6 each Topical Daily  . dexamethasone  1 mg Oral Daily  . feeding supplement  237 mL Oral BID BM  . furosemide  40 mg Intravenous BID  . insulin aspart  0-15 Units Subcutaneous TID WC  . midodrine  10 mg Oral Daily  . rivaroxaban  15 mg Oral BID WC   Followed by  . [START ON 01/12/2021] rivaroxaban  20 mg Oral Q supper  . senna  1 tablet Oral QODAY  . sodium chloride flush  3 mL Intravenous Q12H  . tamsulosin  0.4 mg Oral Daily   Continuous Infusions:   LOS: 0 days    Time spent: 39 minutes spent on chart review, discussion with nursing staff, consultants, updating family and interview/physical exam; more than 50% of that time was spent in counseling and/or coordination of care.    Taneal Sonntag J [place] DO [facility] Available  via Epic secure chat 7am-7pm After these hours, please refer to coverage provider listed on [email or web address] 12/26/2020, 3:14 PM

## 2020-12-26 NOTE — Progress Notes (Signed)
Hypoglycemic Event  CBG: 60  Treatment: pt was eating breakfast, orange juice given also  Symptoms: Asymptomatic  Follow-up CBG: Time: 1245 CBG Result: 88  Possible Reasons for Event: Pt states he only had breakfast yesterday and did not eat until late last night  Comments/MD notified: [place] MD    Carney Living

## 2020-12-26 NOTE — Telephone Encounter (Signed)
Scheduled per los. Called and spoke with patient., confirmed appt

## 2020-12-26 NOTE — Care Management Obs Status (Signed)
[facility] NOTIFICATION   Patient Details  Name: Dennis [name]. MRN: [id number] Date of Birth: [birth year]   Medicare Observation Status Notification Given:  Yes    Dessa Phi, RN 12/26/2020, 2:39 PM

## 2020-12-26 NOTE — Progress Notes (Signed)
  Patient received from ED.    12/25/20 2131  Vitals  Temp 98.2 F (36.8 C)  Temp Source Oral  BP (!) 124/94  MAP (mmHg) 100  BP Location Left Arm  BP Method Automatic  Patient Position (if appropriate) Lying  Pulse Rate (!) 103  Pulse Rate Source Monitor  Resp 20  Level of Consciousness  Level of Consciousness Alert  MEWS COLOR  MEWS Score Color Green  Oxygen Therapy  SpO2 100 %  O2 Device Nasal Cannula  O2 Flow Rate (L/min) 3 L/min

## 2020-12-26 NOTE — Progress Notes (Signed)
  Echocardiogram 2D Echocardiogram has been performed.  Jennette Dubin 12/26/2020, 11:59 AM

## 2020-12-27 ENCOUNTER — Ambulatory Visit: Payer: Medicare Other

## 2020-12-27 LAB — BASIC METABOLIC PANEL
Anion gap: 5 (ref 5–15)
BUN: 25 mg/dL — ABNORMAL HIGH (ref 6–20)
CO2: 33 mmol/L — ABNORMAL HIGH (ref 22–32)
Calcium: 8.8 mg/dL — ABNORMAL LOW (ref 8.9–10.3)
Chloride: 104 mmol/L (ref 98–111)
Creatinine, Ser: 1.36 mg/dL — ABNORMAL HIGH (ref 0.61–1.24)
GFR, Estimated: >60 mL/min (ref >60)
Glucose, Bld: 243 mg/dL — ABNORMAL HIGH (ref 70–99)
Potassium: 4.1 mmol/L (ref 3.5–5.1)
Sodium: 142 mmol/L (ref 135–145)

## 2020-12-27 LAB — MAGNESIUM: Magnesium: 2 mg/dL (ref 1.7–2.4)

## 2020-12-27 LAB — GLUCOSE, RANDOM: Glucose, Bld: 383 mg/dL — ABNORMAL HIGH (ref 70–99)

## 2020-12-27 LAB — GLUCOSE, CAPILLARY
Glucose-Capillary: 197 mg/dL — ABNORMAL HIGH (ref 70–99)
Glucose-Capillary: 240 mg/dL — ABNORMAL HIGH (ref 70–99)
Glucose-Capillary: 411 mg/dL — ABNORMAL HIGH (ref 70–99)
Glucose-Capillary: 76 mg/dL (ref 70–99)

## 2020-12-27 LAB — VITAMIN D 25 HYDROXY (VIT D DEFICIENCY, FRACTURES): Vit D, 25-Hydroxy: 34.13 ng/mL (ref 30–100)

## 2020-12-27 MED ORDER — INSULIN ASPART 100 UNIT/ML ~~LOC~~ SOLN
5.0000 [IU] | Freq: Once | SUBCUTANEOUS | Status: AC
  Administered 2020-12-27: 5 [IU] via SUBCUTANEOUS

## 2020-12-27 MED ORDER — INSULIN GLARGINE 100 UNIT/ML ~~LOC~~ SOLN
15.0000 [IU] | Freq: Every day | SUBCUTANEOUS | Status: DC
  Administered 2020-12-27: 15 [IU] via SUBCUTANEOUS
  Filled 2020-12-27: qty 0.15

## 2020-12-27 MED ORDER — INSULIN GLARGINE 100 UNIT/ML ~~LOC~~ SOLN
15.0000 [IU] | Freq: Two times a day (BID) | SUBCUTANEOUS | Status: DC
  Administered 2020-12-27 – 2020-12-28 (×3): 15 [IU] via SUBCUTANEOUS
  Filled 2020-12-27 (×4): qty 0.15

## 2020-12-27 MED ORDER — INSULIN ASPART 100 UNIT/ML ~~LOC~~ SOLN
0.0000 [IU] | Freq: Three times a day (TID) | SUBCUTANEOUS | Status: DC
  Administered 2020-12-27: 3 [IU] via SUBCUTANEOUS
  Administered 2020-12-27: 15 [IU] via SUBCUTANEOUS
  Administered 2020-12-27 – 2020-12-28 (×2): 5 [IU] via SUBCUTANEOUS
  Administered 2020-12-28: 3 [IU] via SUBCUTANEOUS
  Administered 2020-12-29: 5 [IU] via SUBCUTANEOUS
  Administered 2020-12-29: 8 [IU] via SUBCUTANEOUS

## 2020-12-27 MED ORDER — FUROSEMIDE 10 MG/ML IJ SOLN
40.0000 mg | Freq: Three times a day (TID) | INTRAMUSCULAR | Status: DC
  Administered 2020-12-27 – 2020-12-28 (×4): 40 mg via INTRAVENOUS
  Filled 2020-12-27 (×3): qty 4

## 2020-12-27 MED ORDER — GLUCERNA SHAKE PO LIQD
237.0000 mL | Freq: Three times a day (TID) | ORAL | Status: DC
  Administered 2020-12-27 – 2020-12-29 (×4): 237 mL via ORAL
  Filled 2020-12-27 (×9): qty 237

## 2020-12-27 MED ORDER — INSULIN ASPART 100 UNIT/ML ~~LOC~~ SOLN: 0.0000 [IU] | Freq: Every day | SUBCUTANEOUS | Status: DC

## 2020-12-27 NOTE — Progress Notes (Signed)
PROGRESS NOTE    Dennis [name].  GLO:[id number] DOB: [birth year] DOA: 12/25/2020 PCP: [name], MD    Brief Narrative:  Dennis Dunlap. is a 60 year old male with past medical history significant for chronic combined systolic and diastolic congestive heart failure s/p AICD, small cell lung cancer with brain metastasis, history of cocaine use, essential hypertension, orthostasis, CVA with left-sided hemiplegia, hyperlipidemia, type 2 diabetes mellitus, left lower extremity DVT who presented to the ED with progressive shortness of breath and lower extremity edema.  Patient sent in by his oncologist during routine visit for shortness of breath and leg edema.  Onset roughly 1 week ago with associated orthopnea.  In the ED, temperature 97.7 F, HR 104, RR 26, BP 125/94, SPO2 93% on 2 L nasal cannula.  Sodium 143, potassium 3.5, chloride 109, CO2 27, glucose 103, BUN 22, creatinine of 1.23.  AST 31, ALT 67, total bilirubin 1.1.  BNP 1073.  WBC 10.2, hemoglobin 10.9, platelets 212.  Chest x-ray with persistent diffuse increased interstitial markings compatible with interstitial edema, percent pleural effusions with opacification of the lower lung zones, asymmetric airspace opacification LLL which reflects pneumonia versus atelectasis.  EKG with sinus tachycardia.  Hospitalist service consulted for further evaluation and management of acute on chronic combined congestive heart failure.   Assessment & Plan:   Principal Problem:   Acute on chronic systolic congestive heart failure (HCC) Active Problems:   Automatic implantable cardioverter-defibrillator in situ   Hyperlipidemia associated with type 2 diabetes mellitus (HCC)   Left hemiplegia (HCC)   History of CVA with residual deficit   Small cell lung cancer, right upper lobe ([place])   Uncontrolled type 2 diabetes mellitus with hyperglycemia, with long-term current use of insulin (HCC)   Brain metastases (HCC)   Acute hypoxic respiratory  failure, POA Acute on chronic combined systolic/diastolic congestive heart failure Patient presenting with progressive dyspnea, lower extremity edema for 1-2 weeks.  Also associated 20 pound weight gain.  Patient was noted to have diffuse interstitial markings consistent with interstitial edema on chest x-ray with elevated BNP of 1073.  Patient was noted to be hypoxic requiring supplemental oxygen.  TTE 07/08/2020 with LVEF 25-30%, LV with decreased function and global hypokinesis, grade 1 diastolic dysfunction. --net negative 424mL past 24h, net negative 1.2L since admission --Continue furosemide 40 mg IV q8hh --1500 mL fluid restriction --Continue supplemental oxygen, maintain SPO2 greater than 92%, on 3L [place] w/ SpO2 96% --Strict I's and O's and daily weights --repeat CXR in am  Left lower extremity DVT Previously on Coumadin, changed to Xarelto recently by oncology. --Continue Xarelto  Small cell lung cancer with brain metastases Follows with medical oncology, [name] outpatient.  Currently on systemic chemotherapy with carboplatin, etoposide, Imfinzi.  CT chest with enlargement of spiculated nodule right middle lobe, worsens with new enlarged pretracheal/subcarinal lymph nodes. --Decadron 1 mg p.o. daily --Outpatient follow-up with medical oncology  Recent left femur fracture Patient underwent IM nail to left femur fracture by Dr. Doreatha Martin on 10/31/2020.  Patient states has appointment with orthopedics outpatient. --Orthopedics consulted for evaluation and possible need for follow-up x-rays  Orthostatic hypotension --Midodrine 10 mg p.o. daily  Type 2 diabetes mellitus Home regimen includes Lantus 25 units BID, Humalog 6 units BID. --SSI for coverage --CBGs qAC/HS  BPH: Tamsulosin 0.4 mg p.o. daily   DVT prophylaxis: Xarelto   Code Status: Full Code Family Communication: No family present at bedside  Disposition Plan:  Level of care: Telemetry Status is:  Inpatient  Remains inpatient appropriate because:Ongoing diagnostic testing needed not appropriate for outpatient work up, Unsafe d/c plan, IV treatments appropriate due to intensity of illness or inability to take PO and Inpatient level of care appropriate due to severity of illness   Dispo: The patient is from: Home              Anticipated d/c is to: Home              Patient currently is not medically stable to d/c.   Difficult to place patient No   Consultants:   none  Procedures:   None  Antimicrobials:   None   Subjective: Patient seen and examined bedside, resting comfortably.  Shortness of breath improved. Remains on supplemental oxygen, not oxygen dependent at baseline.  No other questions or concerns at this time.  Denies headache, no visual changes, no chest pain, no palpitations, no abdominal pain, no fever/chills/night sweats, no nausea/vomiting/diarrhea.  No acute events overnight per nursing staff.  Objective: Vitals:   12/27/20 0100 12/27/20 0411 12/27/20 0520 12/27/20 1252  BP:   116/84 117/90  Pulse:   (!) 105 (!) 105  Resp: 15  20 20   Temp:   98.3 F (36.8 C) (!) 97.4 F (36.3 C)  TempSrc:   Oral Oral  SpO2:   96% 100%  Weight:  86.5 kg    Height:        Intake/Output Summary (Last 24 hours) at 12/27/2020 1426 Last data filed at 12/27/2020 1307 Gross per 24 hour  Intake 855 ml  Output 2875 ml  Net -2020 ml   Filed Weights   12/25/20 2128 12/26/20 0634 12/27/20 0411  Weight: 88.5 kg 88.4 kg 86.5 kg    Examination:  General exam: Appears calm and comfortable  Respiratory system: Decreased breath sounds bilateral bases with crackles, normal respiratory effort without accessory muscle use, on 3 L nasal cannula with SPO2 96% (not oxygen dependent at baseline) Cardiovascular system: S1 & S2 heard, RRR. No JVD, murmurs, rubs, gallops or clicks. No pedal edema. Gastrointestinal system: Abdomen is nondistended, soft and nontender. No organomegaly or  masses felt. Normal bowel sounds heard. Central nervous system: Alert and oriented. No focal neurological deficits. Extremities: Symmetric 5 x 5 power. Skin: No rashes, lesions or ulcers Psychiatry: Judgement and insight appear normal. Mood & affect appropriate.     Data Reviewed: I have personally reviewed following labs and imaging studies  CBC: Recent Labs  Lab 12/25/20 1726 12/26/20 0612  WBC 10.2 9.2  NEUTROABS 8.0*  --   HGB 10.9* 10.6*  HCT 36.3* 35.5*  MCV 101.7* 101.7*  PLT 212 132   Basic Metabolic Panel: Recent Labs  Lab 12/25/20 1726 12/25/20 1851 12/26/20 0612 12/27/20 0346  NA 143  --  143 142  K 3.5  --  3.5 4.1  CL 109  --  108 104  CO2 27  --  27 33*  GLUCOSE 103*  --  86 243*  BUN 22*  --  21* 25*  CREATININE 1.23  --  1.14 1.36*  CALCIUM 8.6*  --  8.7* 8.8*  MG  --  1.7  --  2.0   GFR: Estimated Creatinine Clearance: 65 mL/min (A) (by C-G formula based on SCr of 1.36 mg/dL (H)). Liver Function Tests: Recent Labs  Lab 12/25/20 1726 12/26/20 0612  AST 31 29  ALT 67* 57*  ALKPHOS 163* 153*  BILITOT 1.1 1.1  PROT 6.0* 5.6*  ALBUMIN 3.0* 3.0*  No results for input(s): LIPASE, AMYLASE in the last 168 hours. No results for input(s): AMMONIA in the last 168 hours. Coagulation Profile: Recent Labs  Lab 12/25/20 0000  INR 2.0   Cardiac Enzymes: No results for input(s): CKTOTAL, CKMB, CKMBINDEX, TROPONINI in the last 168 hours. BNP (last 3 results) No results for input(s): PROBNP in the last 8760 hours. HbA1C: No results for input(s): HGBA1C in the last 72 hours. CBG: Recent Labs  Lab 12/26/20 1159 12/26/20 1623 12/26/20 2118 12/27/20 0729 12/27/20 1057  GLUCAP 146* 110* 176* 197* 240*   Lipid Profile: No results for input(s): CHOL, HDL, LDLCALC, TRIG, CHOLHDL, LDLDIRECT in the last 72 hours. Thyroid Function Tests: No results for input(s): TSH, T4TOTAL, FREET4, T3FREE, THYROIDAB in the last 72 hours. Anemia Panel: No results  for input(s): VITAMINB12, FOLATE, FERRITIN, TIBC, IRON, RETICCTPCT in the last 72 hours. Sepsis Labs: No results for input(s): PROCALCITON, LATICACIDVEN in the last 168 hours.  Recent Results (from the past 240 hour(s))  SARS CORONAVIRUS 2 (TAT 6-24 HRS) Nasopharyngeal Nasopharyngeal Swab     Status: None   Collection Time: 12/25/20  7:24 PM   Specimen: Nasopharyngeal Swab  Result Value Ref Range Status   SARS Coronavirus 2 NEGATIVE NEGATIVE Final    Comment: (NOTE) SARS-CoV-2 target nucleic acids are NOT DETECTED.  The SARS-CoV-2 RNA is generally detectable in upper and lower respiratory specimens during the acute phase of infection. Negative results do not preclude SARS-CoV-2 infection, do not rule out co-infections with other pathogens, and should not be used as the sole basis for treatment or other patient management decisions. Negative results must be combined with clinical observations, patient history, and epidemiological information. The expected result is Negative.  Fact Sheet for Patients: SugarRoll.be  Fact Sheet for Healthcare Providers: https://www.woods-mathews.com/  This test is not yet approved or cleared by the [place] FDA and  has been authorized for detection and/or diagnosis of SARS-CoV-2 by FDA under an Emergency Use Authorization (EUA). This EUA will remain  in effect (meaning this test can be used) for the duration of the COVID-19 declaration under Se ction 564(b)(1) of the Act, 21 U.S.C. section 360bbb-3(b)(1), unless the authorization is terminated or revoked sooner.  Performed at [facility], [place] [address], [place], [place] [address]          Radiology Studies: DG Chest Port 1 View  Result Date: 12/25/2020 CLINICAL DATA:  Shortness of breath. EXAM: PORTABLE CHEST 1 VIEW COMPARISON:  CT chest 12/24/2020. FINDINGS: Right chest wall port a catheter tip is at the cavoatrial junction. There is a  left chest wall pacer device with leads in the right atrial appendage and right ventricle. Aortic atherosclerosis. Diffusely increased interstitial markings identified compatible with pulmonary edema. Small pleural effusions with veil like opacification of the lower lobes identified. Left lung base airspace disease. Chronic interstitial changes of COPD/emphysema. IMPRESSION: 1. Persistent diffuse increase interstitial markings compatible with interstitial edema. Persistent pleural effusions with veil like opacification of the lower lung zones. 2. Asymmetric airspace opacification within the left lower lobe which may reflect pneumonia and or atelectasis. Electronically Signed   By: Kerby Moors M.D.   On: 12/25/2020 16:58   ECHOCARDIOGRAM COMPLETE  Result Date: 12/26/2020    ECHOCARDIOGRAM REPORT   Patient Name:   Teryn Gust. Date of Exam: 12/26/2020 Medical Rec #:  [id number]       Height:       72.0 in Accession #:    [id number]  Weight:       194.9 lb Date of Birth:  December 16, 1960        BSA:          2.107 m Patient Age:    59 years        BP:           108/83 mmHg Patient Gender: M               HR:           102 bpm. Exam Location:  Inpatient Procedure: 2D Echo Indications:    Dyspnea R06.00  History:        Patient has prior history of Echocardiogram examinations, most                 recent 07/09/2020. CHF, CAD, Signs/Symptoms:Murmur; Risk                 Factors:Diabetes and Drug abuse.  Sonographer:    Mikki Santee RDCS (AE) Referring Phys: [id number] [place]  1. Left ventricular ejection fraction, by estimation, is 15-20%. The left ventricle has severely decreased function. The left ventricle demonstrates global hypokinesis. The left ventricular internal cavity size was moderately to severely dilated. Indeterminate diastolic filling due to E-A fusion.  2. Right ventricular systolic function is moderately reduced. The right ventricular size is moderately enlarged. There is  normal pulmonary artery systolic pressure. The estimated right ventricular systolic pressure is 45.4 mmHg.  3. Left atrial size was moderately dilated.  4. Right atrial size was moderately dilated.  5. The mitral valve is grossly normal. Severe mitral valve regurgitation. No evidence of mitral stenosis.  6. Tricuspid valve regurgitation is mild to moderate.  7. The aortic valve is tricuspid. Aortic valve regurgitation is trivial. No aortic stenosis is present.  8. The inferior vena cava is dilated in size with >50% respiratory variability, suggesting right atrial pressure of 8 mmHg. Comparison(s): Changes from prior study are noted. EF is now 15-20%. Severe MR is present. FINDINGS  Left Ventricle: Left ventricular ejection fraction, by estimation, is 15-20%. The left ventricle has severely decreased function. The left ventricle demonstrates global hypokinesis. The left ventricular internal cavity size was moderately to severely dilated. There is no left ventricular hypertrophy. Indeterminate diastolic filling due to E-A fusion. Right Ventricle: The right ventricular size is moderately enlarged. No increase in right ventricular wall thickness. Right ventricular systolic function is moderately reduced. There is normal pulmonary artery systolic pressure. The tricuspid regurgitant velocity is 2.17 m/s, and with an assumed right atrial pressure of 8 mmHg, the estimated right ventricular systolic pressure is 09.8 mmHg. Left Atrium: Left atrial size was moderately dilated. Right Atrium: Right atrial size was moderately dilated. Pericardium: Trivial pericardial effusion is present. Mitral Valve: The mitral valve is grossly normal. Severe mitral valve regurgitation. No evidence of mitral valve stenosis. Tricuspid Valve: The tricuspid valve is grossly normal. Tricuspid valve regurgitation is mild to moderate. No evidence of tricuspid stenosis. Aortic Valve: The aortic valve is tricuspid. Aortic valve regurgitation is trivial.  No aortic stenosis is present. Pulmonic Valve: The pulmonic valve was grossly normal. Pulmonic valve regurgitation is mild. No evidence of pulmonic stenosis. Aorta: The aortic root and ascending aorta are structurally normal, with no evidence of dilitation. Venous: The inferior vena cava is dilated in size with greater than 50% respiratory variability, suggesting right atrial pressure of 8 mmHg. IAS/Shunts: The atrial septum is grossly normal. Additional Comments: A device lead is visualized in  the right atrium and right ventricle.  LEFT VENTRICLE PLAX 2D LVIDd:         6.50 cm      Diastology LVIDs:         5.60 cm      LV e' medial:  4.43 cm/s LV PW:         1.20 cm      LV e' lateral: 4.65 cm/s LV IVS:        0.90 cm LVOT diam:     2.20 cm LV SV:         29 LV SV Index:   14 LVOT Area:     3.80 cm  LV Volumes (MOD) LV vol d, MOD A2C: 196.0 ml LV vol d, MOD A4C: 160.0 ml LV vol s, MOD A2C: 163.0 ml LV vol s, MOD A4C: 129.0 ml LV SV MOD A2C:     33.0 ml LV SV MOD A4C:     160.0 ml LV SV MOD BP:      37.3 ml RIGHT VENTRICLE RV S prime:     6.98 cm/s TAPSE (M-mode): 1.4 cm LEFT ATRIUM              Index       RIGHT ATRIUM           Index LA diam:        4.40 cm  2.09 cm/m  RA Area:     25.60 cm LA Vol (A2C):   108.0 ml 51.25 ml/m RA Volume:   92.90 ml  44.09 ml/m LA Vol (A4C):   84.2 ml  39.96 ml/m LA Biplane Vol: 94.9 ml  45.04 ml/m  AORTIC VALVE LVOT Vmax:   58.10 cm/s LVOT Vmean:  38.600 cm/s LVOT VTI:    0.077 m  AORTA Ao Root diam: 3.50 cm MR Peak grad:    65.0 mmHg   TRICUSPID VALVE MR Mean grad:    41.0 mmHg   TR Peak grad:   18.8 mmHg MR Vmax:         403.00 cm/s TR Vmax:        217.00 cm/s MR Vmean:        302.0 cm/s MR PISA:         6.28 cm    SHUNTS MR PISA Eff ROA: 59 mm      Systemic VTI:  0.08 m MR PISA Radius:  1.00 cm     Systemic Diam: 2.20 cm Eleonore Chiquito MD Electronically signed by Eleonore Chiquito MD Signature Date/Time: 12/26/2020/1:41:55 PM    Final    DG FEMUR MIN 2 VIEWS LEFT  Result  Date: 12/26/2020 CLINICAL DATA:  Fracture follow-up EXAM: LEFT FEMUR 2 VIEWS COMPARISON:  11/13/2020 FINDINGS: Comminuted fracture distal femur unchanged. There is periosteal new bone formation around the fracture compatible with interval healing. ORIF with locking intramedullary rod extending into the distal femur. Rod in satisfactory position. No complications. IMPRESSION: Early healing of fracture distal femur. Intramedullary rod in good position. Electronically Signed   By: Franchot Gallo M.D.   On: 12/26/2020 13:33        Scheduled Meds: . atorvastatin  20 mg Oral Daily  . Chlorhexidine Gluconate Cloth  6 each Topical Daily  . dexamethasone  1 mg Oral Daily  . feeding supplement (GLUCERNA SHAKE)  237 mL Oral TID BM  . furosemide  40 mg Intravenous Q8H  . insulin aspart  0-15 Units Subcutaneous TID WC  . insulin aspart  0-5  Units Subcutaneous QHS  . insulin glargine  15 Units Subcutaneous Daily  . midodrine  10 mg Oral Daily  . rivaroxaban  15 mg Oral BID WC   Followed by  . [START ON 01/12/2021] rivaroxaban  20 mg Oral Q supper  . senna  1 tablet Oral QODAY  . sodium chloride flush  3 mL Intravenous Q12H  . tamsulosin  0.4 mg Oral Daily   Continuous Infusions:   LOS: 1 day    Time spent: 36 minutes spent on chart review, discussion with nursing staff, consultants, updating family and interview/physical exam; more than 50% of that time was spent in counseling and/or coordination of care.    Montie Swiderski J [place] DO [facility] Available via Epic secure chat 7am-7pm After these hours, please refer to coverage provider listed on [email or web address] 12/27/2020, 2:26 PM

## 2020-12-27 NOTE — Progress Notes (Signed)
MD made aware of patients elevated glucose of 411.  Patient had just had an ensure and had a meal tray delivered early prior to sugar check.  MD order to give total of 20 units, so extra order for 5 units was placed to go along with SSI amount of 15.

## 2020-12-27 NOTE — Evaluation (Signed)
Physical Therapy Evaluation Patient Details Name: Dennis [name]. MRN: [id number] DOB: 09-07-1961 Today's Date: 12/27/2020   History of Present Illness  Dennis Derrick Tiegs. is a 60 y.o. 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  Clinical Impression  Pt admitted with above diagnosis. Pt requiring min G for safety with transfers, unable to take steps away from chair due to moderate to severe SOB with standing marching, tolerating 18 seconds max before requiring seated rest break. At baseline, pt not on supplemental O2 during the day and reports 24/7 family assists him with bathing, dressing, ambulating with RW inside the home, using WC in community and provide transportation. Pt on 3L with SpO2 100% at rest and 95% with standing marching. Per nursing, pt was recently assisted with wash up so pt possibly more fatigued than if eval would have been completed before. Pt currently with functional limitations due to the deficits listed below (see PT Problem List). Pt will benefit from skilled PT to increase their independence and safety with mobility to allow discharge to the venue listed below.       Follow Up Recommendations Home health PT;Supervision/Assistance - 24 hour    Equipment Recommendations  None recommended by PT    Recommendations for Other Services       Precautions / Restrictions Precautions Precautions: Fall Precaution Comments: dyspnea Restrictions Weight Bearing Restrictions: No      Mobility  Bed Mobility  General bed mobility comments: in chair upon arrival    Transfers Overall transfer level: Needs assistance Equipment used: Rolling walker (2 wheeled) Transfers: Sit to/from Stand Sit to Stand: Supervision    General  transfer comment: BUE assisting to power up, moderate SOB with transfer, supv for safety  Ambulation/Gait Ambulation/Gait assistance: Supervision  Assistive device: Rolling walker (2 wheeled)  General Gait Details: pt tolerates 3 standing marching in place bouts for 15 seconds, 18 seconds and 15 seconds requiring seated rest break between and moderate to severe SOB, on 3L with SpO2 95%  Stairs            Wheelchair Mobility    Modified Rankin (Stroke Patients Only)       Balance Overall balance assessment: Mild deficits observed, not formally tested          Pertinent Vitals/Pain Pain Assessment: No/denies pain    Home Living Family/patient expects to be discharged to:: Private residence Living Arrangements: Parent;Other relatives (mom and brothers) Available Help at Discharge: Family;Available 24 hours/day Type of Home: House Home Access: Stairs to enter Entrance Stairs-Rails: Right Entrance Stairs-Number of Steps: 2 Home Layout: Two level;Full bath on main level;1/2 bath on main level Home Equipment: Walker - 2 wheels;Bedside commode;Wheelchair - manual;Hospital bed;Cane - single point Additional Comments: Pt reports half bath on main level, lives in [place] on main level.    Prior Function Level of Independence: Needs assistance   Gait / Transfers Assistance Needed: Pt reports family assists him with household ambulation using RW, uses WC for community distances.  ADL's / Homemaking Assistance Needed: Pt reports family assists him with dressing, takes sponge baths. Family cooks meals and cleans the home.  Comments: Pt reports brother provides transportation.     Hand Dominance   Dominant Hand: Right    Extremity/Trunk Assessment   Upper  Extremity Assessment Upper Extremity Assessment: Overall WFL for tasks assessed    Lower Extremity Assessment Lower Extremity Assessment: Overall WFL for tasks assessed (AROM WNL, strength 4/5 throughout)    Cervical /  Trunk Assessment Cervical / Trunk Assessment: Normal  Communication   Communication: No difficulties  Cognition Arousal/Alertness: Awake/alert Behavior During Therapy: WFL for tasks assessed/performed Overall Cognitive Status: Within Functional Limits for tasks assessed       General Comments General comments (skin integrity, edema, etc.): Pt on 3L O2, SpO2 100% at rest and 95% with standing marching    Exercises     Assessment/Plan    PT Assessment Patient needs continued PT services  PT Problem List Decreased activity tolerance;Decreased balance;Cardiopulmonary status limiting activity       PT Treatment Interventions DME instruction;Gait training;Functional mobility training;Therapeutic activities;Therapeutic exercise;Balance training;Patient/family education    PT Goals (Current goals can be found in the Care Plan section)  Acute Rehab PT Goals Patient Stated Goal: home with family to assist PT Goal Formulation: With patient Time For Goal Achievement: 01/10/21 Potential to Achieve Goals: Good    Frequency Min 3X/week   Barriers to discharge        Co-evaluation               AM-PAC PT "6 Clicks" Mobility  Outcome Measure Help needed turning from your back to your side while in a flat bed without using bedrails?: A Little Help needed moving from lying on your back to sitting on the side of a flat bed without using bedrails?: A Little Help needed moving to and from a bed to a chair (including a wheelchair)?: A Little Help needed standing up from a chair using your arms (e.g., wheelchair or bedside chair)?: None Help needed to walk in hospital room?: A Little Help needed climbing 3-5 steps with a railing? : A Little 6 Click Score: 19    End of Session Equipment Utilized During Treatment: Gait belt;Oxygen Activity Tolerance: Patient limited by fatigue Patient left: in chair;with call bell/phone within reach Nurse Communication: Mobility status PT Visit  Diagnosis: Other abnormalities of gait and mobility (R26.89)    Time: 6546-5035 PT Time Calculation (min) (ACUTE ONLY): 21 min   Charges:   PT Evaluation $PT Eval Moderate Complexity: 1 Mod          Tori Broussard PT, DPT 12/27/20, 1:46 PM

## 2020-12-27 NOTE — Consult Note (Signed)
Orthopaedic Trauma Consult Note  SUBJECTIVE: Patient admitted to [facility] on 12/25/2020 for CHF exacerbation.  Orthopedic trauma service asked to evaluate patient while hospitalized.  2 months out status post retrograde intramedullary nailing of left femur.  Doing well this morning.  Pain has been well controlled.  States he has been up ambulating with his walker at home.  Has been working with home health physical therapies on exercises.  Was found to have an age indeterminate DVT involving the left popliteal vein on 12/19/2020.  Patient was previously on Coumadin, recently changed to Xarelto by oncology.  No issues with his incisions.   OBJECTIVE:  Vitals:   12/27/20 0100 12/27/20 0520  BP:  116/84  Pulse:  (!) 105  Resp: 15 20  Temp:  98.3 F (36.8 C)  SpO2:  96%   General: Sitting up in bed, eating breakfast.  NAD Respiratory: No increased work of breathing at rest, currently on 3 L O2 via nasal cannula LLE: Incisions fully healed.  Edema through the lower leg, ankle, foot related to CHF.  No significant tenderness with palpation through leg.  Able to get knee to full extension.  Ankle dorsiflexion/plantarflexion intact. Endorses sensation to light touch distally. 2+ DP pulse    IMAGING: Repeat imaging left femur done 12/26/2020 stable  ASSESSMENT: Dennis [name]. is a 60 y.o. male,  s/p RETROGRADE IMN LEFT FEMUR 10/31/2020  CV/Blood loss:Hgb stable. Hemodynamically stable  PLAN: Weightbearing: WBAT LLE.  Unrestricted range of motion Showering: Ok to shower with assistance  Orthopedic device(s): None  Pain management: Per primary team VTE prophylaxis:  Coumadin, SCDs Impediments to Fracture Healing: Vit D level 12 at time of admission 11/01/2020.  We will recheck vitamin D level today for improvement as he has been on D3 supplementation for the last 8 weeks. Dispo: Continue care per primary team. Follow - up plan: 3 to 4 weeks after discharge for repeat x-rays of the left  femur.  Contact information:  Dennis Hamming MD, Dennis Pace PA-C. After hours and holidays please check [email or web address] for group call information for [facility]   Dennis A. Ricci Barker, PA-C [phone] (office) [email or web address]

## 2020-12-28 ENCOUNTER — Inpatient Hospital Stay (HOSPITAL_COMMUNITY): Payer: Medicare Other

## 2020-12-28 LAB — BASIC METABOLIC PANEL
Anion gap: 12 (ref 5–15)
BUN: 36 mg/dL — ABNORMAL HIGH (ref 6–20)
CO2: 28 mmol/L (ref 22–32)
Calcium: 8.9 mg/dL (ref 8.9–10.3)
Chloride: 102 mmol/L (ref 98–111)
Creatinine, Ser: 1.53 mg/dL — ABNORMAL HIGH (ref 0.61–1.24)
GFR, Estimated: 52 mL/min — ABNORMAL LOW (ref >60)
Glucose, Bld: 182 mg/dL — ABNORMAL HIGH (ref 70–99)
Potassium: 3.7 mmol/L (ref 3.5–5.1)
Sodium: 142 mmol/L (ref 135–145)

## 2020-12-28 LAB — GLUCOSE, CAPILLARY
Glucose-Capillary: 199 mg/dL — ABNORMAL HIGH (ref 70–99)
Glucose-Capillary: 208 mg/dL — ABNORMAL HIGH (ref 70–99)
Glucose-Capillary: 104 mg/dL — ABNORMAL HIGH (ref 70–99)
Glucose-Capillary: 191 mg/dL — ABNORMAL HIGH (ref 70–99)

## 2020-12-28 IMAGING — DX DG CHEST 1V PORT
1 series · 1 of 1 positions shown · non-contrast
Comparison: [DATE]

CLINICAL DATA: Shortness of breath

EXAM:
PORTABLE CHEST 1 VIEW

[chest ap]
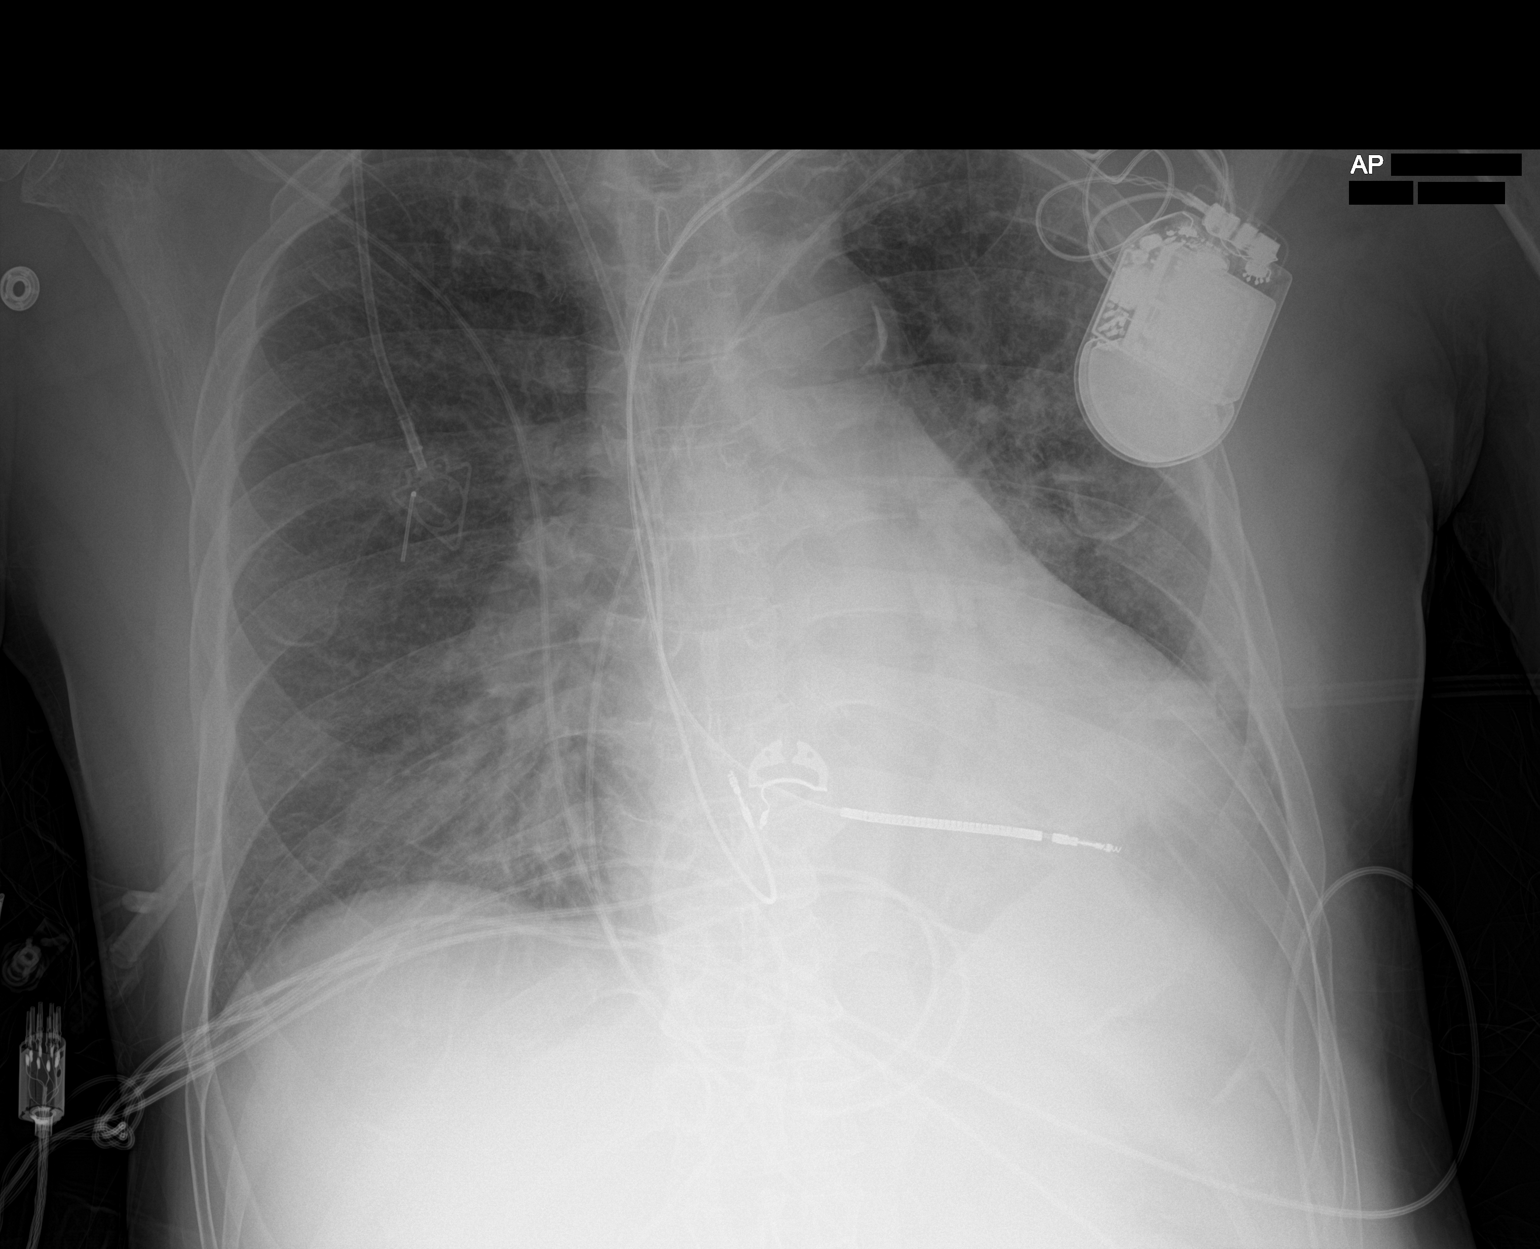

[1 of 1 positions shown; findings below may reference images not displayed]

FINDINGS: Right chest wall port catheter is unchanged. Persistent patchy
bilateral opacities. No significant pleural effusion. No
pneumothorax. Stable cardiomediastinal contours. Left chest wall
ICD.
IMPRESSION: Similar patchy bilateral pulmonary opacities and interstitial
prominence likely reflecting edema. Right middle lobe nodule chest
CT is not.

## 2020-12-28 MED ORDER — FUROSEMIDE 40 MG PO TABS
40.0000 mg | ORAL_TABLET | Freq: Every day | ORAL | Status: DC
  Administered 2020-12-28 – 2020-12-29 (×2): 40 mg via ORAL
  Filled 2020-12-28 (×2): qty 1

## 2020-12-28 NOTE — Progress Notes (Signed)
Ambulatory sats attempted x 2 w/refusal DT: patient feeling like he couldn't breathe and wheezing, O2 sats 95% on RA, encouraged cough and deep breathing exercises

## 2020-12-28 NOTE — Plan of Care (Signed)
  Problem: Activity: Goal: Risk for activity intolerance will decrease Outcome: Progressing   Problem: Coping: Goal: Level of anxiety will decrease 12/28/2020 1850 by Saunders Glance, RN Outcome: Progressing 12/28/2020 1849 by Saunders Glance, RN Outcome: Not Progressing

## 2020-12-28 NOTE — Plan of Care (Deleted)
  Problem: Elimination: Goal: Will not experience complications related to bowel motility Outcome: Progressing   Problem: Coping: Goal: Level of anxiety will decrease Outcome: Not Progressing

## 2020-12-28 NOTE — Care Management Important Message (Signed)
Important Message  Patient Details IM Letter given to the Patient. Name: Dennis [name]. MRN: [id number] Date of Birth: [birth year]   Medicare Important Message Given:  Yes     Kerin Salen 12/28/2020, 10:56 AM

## 2020-12-28 NOTE — Progress Notes (Signed)
PROGRESS NOTE    Dennis [name].  LSL:[id number] DOB: 11/10/1960 DOA: 12/25/2020 PCP: [name], MD    Brief Narrative:  Dennis [name]. is a 60 year old male with past medical history significant for chronic combined systolic and diastolic congestive heart failure s/p AICD, small cell lung cancer with brain metastasis, history of cocaine use, essential hypertension, orthostasis, CVA with left-sided hemiplegia, hyperlipidemia, type 2 diabetes mellitus, left lower extremity DVT who presented to the ED with progressive shortness of breath and lower extremity edema.  Patient sent in by his oncologist during routine visit for shortness of breath and leg edema.  Onset roughly 1 week ago with associated orthopnea.  In the ED, temperature 97.7 F, HR 104, RR 26, BP 125/94, SPO2 93% on 2 L nasal cannula.  Sodium 143, potassium 3.5, chloride 109, CO2 27, glucose 103, BUN 22, creatinine of 1.23.  AST 31, ALT 67, total bilirubin 1.1.  BNP 1073.  WBC 10.2, hemoglobin 10.9, platelets 212.  Chest x-ray with persistent diffuse increased interstitial markings compatible with interstitial edema, percent pleural effusions with opacification of the lower lung zones, asymmetric airspace opacification LLL which reflects pneumonia versus atelectasis.  EKG with sinus tachycardia.  Hospitalist service consulted for further evaluation and management of acute on chronic combined congestive heart failure.   Assessment & Plan:   Principal Problem:   Acute on chronic systolic congestive heart failure (HCC) Active Problems:   Automatic implantable cardioverter-defibrillator in situ   Hyperlipidemia associated with type 2 diabetes mellitus (HCC)   Left hemiplegia (HCC)   History of CVA with residual deficit   Small cell lung cancer, right upper lobe ([place])   Uncontrolled type 2 diabetes mellitus with hyperglycemia, with long-term current use of insulin (HCC)   Brain metastases (HCC)   Acute hypoxic respiratory  failure, POA Acute on chronic combined systolic/diastolic congestive heart failure Patient presenting with progressive dyspnea, lower extremity edema for 1-2 weeks.  Also associated 20 pound weight gain.  Patient was noted to have diffuse interstitial markings consistent with interstitial edema on chest x-ray with elevated BNP of 1073.  Patient was noted to be hypoxic requiring supplemental oxygen.  TTE 07/08/2020 with LVEF 25-30%, LV with decreased function and global hypokinesis, grade 1 diastolic dysfunction. --net negative 1.3L past 24h, net negative 3.5L since admission --Furosemide 40 mg PO BID --1500 mL fluid restriction --Continue supplemental oxygen, maintain SPO2 greater than 92%, on 2L [place] w/ SpO2 96% --Strict I's and O's and daily weights --Ambulatory O2 screening --Repeat BMP in the a.m.  Left lower extremity DVT Previously on Coumadin, changed to Xarelto recently by oncology. --Continue Xarelto  Small cell lung cancer with brain metastases Follows with medical oncology, [name] outpatient.  Currently on systemic chemotherapy with carboplatin, etoposide, Imfinzi.  CT chest with enlargement of spiculated nodule right middle lobe, worsens with new enlarged pretracheal/subcarinal lymph nodes. --Decadron 1 mg p.o. daily --Outpatient follow-up with medical oncology  Recent left femur fracture Patient underwent IM nail to left femur fracture by Dr. Doreatha Martin on 10/31/2020.  Patient states has appointment with orthopedics outpatient. --Orthopedics consulted, repeat imaging left femur stable and recommend outpatient follow-up with orthopedics in 3-4 weeks after discharge for repeat x-rays left femur.  Orthostatic hypotension --Midodrine 10 mg p.o. daily  Type 2 diabetes mellitus Home regimen includes Lantus 25 units BID, Humalog 6 units BID. --SSI for coverage --CBGs qAC/HS  BPH: Tamsulosin 0.4 mg p.o. daily   DVT prophylaxis: Xarelto   Code Status: Full Code Family  Communication: No family present at bedside  Disposition Plan:  Level of care: Telemetry Status is: Inpatient  Remains inpatient appropriate because:Ongoing diagnostic testing needed not appropriate for outpatient work up, Unsafe d/c plan, IV treatments appropriate due to intensity of illness or inability to take PO and Inpatient level of care appropriate due to severity of illness   Dispo: The patient is from: Home              Anticipated d/c is to: Home              Patient currently is not medically stable to d/c.   Difficult to place patient No   Consultants:   none  Procedures:   None  Antimicrobials:   None   Subjective: Patient seen and examined bedside, resting comfortably.  Shortness of breath improved. Remains on supplemental oxygen, 2 L per nasal cannula; not oxygen dependent at baseline.  No other questions or concerns at this time.  Denies headache, no visual changes, no chest pain, no palpitations, no abdominal pain, no fever/chills/night sweats, no nausea/vomiting/diarrhea.  No acute events overnight per nursing staff.  Objective: Vitals:   12/27/20 1252 12/27/20 1712 12/27/20 2048 12/28/20 0449  BP: 117/90  115/87 118/86  Pulse: (!) 105  (!) 102 99  Resp: 20  20 20   Temp: (!) 97.4 F (36.3 C)  97.7 F (36.5 C) 98 F (36.7 C)  TempSrc: Oral  Oral Oral  SpO2: 100% 100% 96% 94%  Weight:    82.2 kg  Height:        Intake/Output Summary (Last 24 hours) at 12/28/2020 1250 Last data filed at 12/28/2020 1208 Gross per 24 hour  Intake 717 ml  Output 3025 ml  Net -2308 ml   Filed Weights   12/26/20 0634 12/27/20 0411 12/28/20 0449  Weight: 88.4 kg 86.5 kg 82.2 kg    Examination:  General exam: Appears calm and comfortable  Respiratory system: Decreased breath sounds bilateral bases with crackles, normal respiratory effort without accessory muscle use, on 2 L nasal cannula with SPO2 94% (not oxygen dependent at baseline) Cardiovascular system: S1 &  S2 heard, RRR. No JVD, murmurs, rubs, gallops or clicks. No pedal edema. Gastrointestinal system: Abdomen is nondistended, soft and nontender. No organomegaly or masses felt. Normal bowel sounds heard. Central nervous system: Alert and oriented. No focal neurological deficits. Extremities: Symmetric 5 x 5 power. Skin: No rashes, lesions or ulcers Psychiatry: Judgement and insight appear normal. Mood & affect appropriate.     Data Reviewed: I have personally reviewed following labs and imaging studies  CBC: Recent Labs  Lab 12/25/20 1726 12/26/20 0612  WBC 10.2 9.2  NEUTROABS 8.0*  --   HGB 10.9* 10.6*  HCT 36.3* 35.5*  MCV 101.7* 101.7*  PLT 212 485   Basic Metabolic Panel: Recent Labs  Lab 12/25/20 1726 12/25/20 1851 12/26/20 0612 12/27/20 0346 12/27/20 1701 12/28/20 0304  NA 143  --  143 142  --  142  K 3.5  --  3.5 4.1  --  3.7  CL 109  --  108 104  --  102  CO2 27  --  27 33*  --  28  GLUCOSE 103*  --  86 243* 383* 182*  BUN 22*  --  21* 25*  --  36*  CREATININE 1.23  --  1.14 1.36*  --  1.53*  CALCIUM 8.6*  --  8.7* 8.8*  --  8.9  MG  --  1.7  --  2.0  --   --    GFR: Estimated Creatinine Clearance: 57.8 mL/min (A) (by C-G formula based on SCr of 1.53 mg/dL (H)). Liver Function Tests: Recent Labs  Lab 12/25/20 1726 12/26/20 0612  AST 31 29  ALT 67* 57*  ALKPHOS 163* 153*  BILITOT 1.1 1.1  PROT 6.0* 5.6*  ALBUMIN 3.0* 3.0*   No results for input(s): LIPASE, AMYLASE in the last 168 hours. No results for input(s): AMMONIA in the last 168 hours. Coagulation Profile: Recent Labs  Lab 12/25/20 0000  INR 2.0   Cardiac Enzymes: No results for input(s): CKTOTAL, CKMB, CKMBINDEX, TROPONINI in the last 168 hours. BNP (last 3 results) No results for input(s): PROBNP in the last 8760 hours. HbA1C: No results for input(s): HGBA1C in the last 72 hours. CBG: Recent Labs  Lab 12/27/20 0729 12/27/20 1057 12/27/20 1626 12/27/20 2046 12/28/20 0737   GLUCAP 197* 240* 411* 76 199*   Lipid Profile: No results for input(s): CHOL, HDL, LDLCALC, TRIG, CHOLHDL, LDLDIRECT in the last 72 hours. Thyroid Function Tests: No results for input(s): TSH, T4TOTAL, FREET4, T3FREE, THYROIDAB in the last 72 hours. Anemia Panel: No results for input(s): VITAMINB12, FOLATE, FERRITIN, TIBC, IRON, RETICCTPCT in the last 72 hours. Sepsis Labs: No results for input(s): PROCALCITON, LATICACIDVEN in the last 168 hours.  Recent Results (from the past 240 hour(s))  SARS CORONAVIRUS 2 (TAT 6-24 HRS) Nasopharyngeal Nasopharyngeal Swab     Status: None   Collection Time: 12/25/20  7:24 PM   Specimen: Nasopharyngeal Swab  Result Value Ref Range Status   SARS Coronavirus 2 NEGATIVE NEGATIVE Final    Comment: (NOTE) SARS-CoV-2 target nucleic acids are NOT DETECTED.  The SARS-CoV-2 RNA is generally detectable in upper and lower respiratory specimens during the acute phase of infection. Negative results do not preclude SARS-CoV-2 infection, do not rule out co-infections with other pathogens, and should not be used as the sole basis for treatment or other patient management decisions. Negative results must be combined with clinical observations, patient history, and epidemiological information. The expected result is Negative.  Fact Sheet for Patients: SugarRoll.be  Fact Sheet for Healthcare Providers: https://www.woods-mathews.com/  This test is not yet approved or cleared by the [place] FDA and  has been authorized for detection and/or diagnosis of SARS-CoV-2 by FDA under an Emergency Use Authorization (EUA). This EUA will remain  in effect (meaning this test can be used) for the duration of the COVID-19 declaration under Se ction 564(b)(1) of the Act, 21 U.S.C. section 360bbb-3(b)(1), unless the authorization is terminated or revoked sooner.  Performed at [facility], [place] [address],  [place], [place] [address]          Radiology Studies: DG CHEST PORT 1 VIEW  Result Date: 12/28/2020 CLINICAL DATA:  Shortness of breath EXAM: PORTABLE CHEST 1 VIEW COMPARISON:  12/25/2020 FINDINGS: Right chest wall port catheter is unchanged. Persistent patchy bilateral opacities. No significant pleural effusion. No pneumothorax. Stable cardiomediastinal contours. Left chest wall ICD. IMPRESSION: Similar patchy bilateral pulmonary opacities and interstitial prominence likely reflecting edema. Right middle lobe nodule chest CT is not. Electronically Signed   By: Macy Mis M.D.   On: 12/28/2020 08:14   DG FEMUR MIN 2 VIEWS LEFT  Result Date: 12/26/2020 CLINICAL DATA:  Fracture follow-up EXAM: LEFT FEMUR 2 VIEWS COMPARISON:  11/13/2020 FINDINGS: Comminuted fracture distal femur unchanged. There is periosteal new bone formation around the fracture compatible with interval healing. ORIF with locking intramedullary rod extending  into the distal femur. Rod in satisfactory position. No complications. IMPRESSION: Early healing of fracture distal femur. Intramedullary rod in good position. Electronically Signed   By: Franchot Gallo M.D.   On: 12/26/2020 13:33        Scheduled Meds: . atorvastatin  20 mg Oral Daily  . Chlorhexidine Gluconate Cloth  6 each Topical Daily  . dexamethasone  1 mg Oral Daily  . feeding supplement (GLUCERNA SHAKE)  237 mL Oral TID BM  . furosemide  40 mg Oral Daily  . insulin aspart  0-15 Units Subcutaneous TID WC  . insulin aspart  0-5 Units Subcutaneous QHS  . insulin glargine  15 Units Subcutaneous BID  . midodrine  10 mg Oral Daily  . rivaroxaban  15 mg Oral BID WC   Followed by  . [START ON 01/12/2021] rivaroxaban  20 mg Oral Q supper  . senna  1 tablet Oral QODAY  . sodium chloride flush  3 mL Intravenous Q12H  . tamsulosin  0.4 mg Oral Daily   Continuous Infusions:   LOS: 2 days    Time spent: 36 minutes spent on chart review, discussion with nursing  staff, consultants, updating family and interview/physical exam; more than 50% of that time was spent in counseling and/or coordination of care.    Eric J [place] DO [facility] Available via Epic secure chat 7am-7pm After these hours, please refer to coverage provider listed on [email or web address] 12/28/2020, 12:50 PM

## 2020-12-29 LAB — BASIC METABOLIC PANEL
Anion gap: 11 (ref 5–15)
BUN: 36 mg/dL — ABNORMAL HIGH (ref 6–20)
CO2: 28 mmol/L (ref 22–32)
Calcium: 8.8 mg/dL — ABNORMAL LOW (ref 8.9–10.3)
Chloride: 101 mmol/L (ref 98–111)
Creatinine, Ser: 1.41 mg/dL — ABNORMAL HIGH (ref 0.61–1.24)
GFR, Estimated: 58 mL/min — ABNORMAL LOW (ref >60)
Glucose, Bld: 236 mg/dL — ABNORMAL HIGH (ref 70–99)
Potassium: 3.6 mmol/L (ref 3.5–5.1)
Sodium: 140 mmol/L (ref 135–145)

## 2020-12-29 LAB — GLUCOSE, CAPILLARY
Glucose-Capillary: 202 mg/dL — ABNORMAL HIGH (ref 70–99)
Glucose-Capillary: 269 mg/dL — ABNORMAL HIGH (ref 70–99)

## 2020-12-29 MED ORDER — INSULIN GLARGINE 100 UNIT/ML ~~LOC~~ SOLN
20.0000 [IU] | Freq: Two times a day (BID) | SUBCUTANEOUS | Status: DC
  Administered 2020-12-29: 20 [IU] via SUBCUTANEOUS
  Filled 2020-12-29: qty 0.2

## 2020-12-29 MED ORDER — BUDESONIDE-FORMOTEROL FUMARATE 160-4.5 MCG/ACT IN AERO: 2.0000 | INHALATION_SPRAY | Freq: Two times a day (BID) | RESPIRATORY_TRACT | 12 refills | Status: AC

## 2020-12-29 MED ORDER — HEPARIN SOD (PORK) LOCK FLUSH 100 UNIT/ML IV SOLN
500.0000 [IU] | INTRAVENOUS | Status: DC | PRN
  Administered 2020-12-29: 500 [IU]
  Filled 2020-12-29: qty 5

## 2020-12-29 MED ORDER — FUROSEMIDE 40 MG PO TABS: 40.0000 mg | ORAL_TABLET | Freq: Every day | ORAL | 0 refills | Status: DC

## 2020-12-29 MED ORDER — HEPARIN SOD (PORK) LOCK FLUSH 100 UNIT/ML IV SOLN
500.0000 [IU] | INTRAVENOUS | Status: DC
  Filled 2020-12-29: qty 5

## 2020-12-29 MED ORDER — BUDESONIDE 0.25 MG/2ML IN SUSP
0.2500 mg | Freq: Two times a day (BID) | RESPIRATORY_TRACT | Status: DC
  Administered 2020-12-29: 0.25 mg via RESPIRATORY_TRACT
  Filled 2020-12-29: qty 2

## 2020-12-29 MED ORDER — SPIRIVA HANDIHALER 18 MCG IN CAPS: 18.0000 ug | ORAL_CAPSULE | Freq: Every day | RESPIRATORY_TRACT | 3 refills | Status: AC

## 2020-12-29 NOTE — Discharge Summary (Signed)
Physician Discharge Summary  Dennis [name]. WPV:[id number] DOB: 02-25-1961 DOA: 12/25/2020  PCP: [name], MD  Admit date: 12/25/2020 Discharge date: 12/29/2020  Admitted From: Home Disposition: Home  Recommendations for Outpatient Follow-up:  1. Follow up with PCP in 1-2 weeks 2. Follow-up with cardiology, Dr. Haroldine Laws in 2-3 weeks 3. Follow-up with medical oncology as scheduled 4. Increase furosemide to 40 mg p.o. daily 5. Discontinued sodium chloride tablets as possible etiology to excessive volume 6. Started on Spiriva/Symbicort 7. Please obtain BMP in one week  Home Health: PT/RN Equipment/Devices: Oxygen, 2 L per nasal cannula  Discharge Condition: Stable, but overall poor prognosis given his metastatic cancer CODE STATUS: Full code Diet recommendation: Heart healthy/consistent carbohydrate diet  History of present illness:  Dennis [name]. is a 60 year old male with past medical history significant for chronic combined systolic and diastolic congestive heart failure s/p AICD, small cell lung cancer with brain metastasis, history of cocaine use, essential hypertension, orthostasis, CVA with left-sided hemiplegia, hyperlipidemia, type 2 diabetes mellitus, left lower extremity DVT who presented to the ED with progressive shortness of breath and lower extremity edema.  Patient sent in by his oncologist during routine visit for shortness of breath and leg edema.  Onset roughly 1 week ago with associated orthopnea.  In the ED, temperature 97.7 F, HR 104, RR 26, BP 125/94, SPO2 93% on 2 L nasal cannula.  Sodium 143, potassium 3.5, chloride 109, CO2 27, glucose 103, BUN 22, creatinine of 1.23.  AST 31, ALT 67, total bilirubin 1.1.  BNP 1073.  WBC 10.2, hemoglobin 10.9, platelets 212.  Chest x-ray with persistent diffuse increased interstitial markings compatible with interstitial edema, percent pleural effusions with opacification of the lower lung zones, asymmetric airspace  opacification LLL which reflects pneumonia versus atelectasis.  EKG with sinus tachycardia.  Hospitalist service consulted for further evaluation and management of acute on chronic combined congestive heart failure.  Hospital course:  Acute hypoxic respiratory failure, POA Acute on chronic combined systolic/diastolic congestive heart failure Patient presenting with progressive dyspnea, lower extremity edema for 1-2 weeks.  Also associated 20 pound weight gain.  Patient was noted to have diffuse interstitial markings consistent with interstitial edema on chest x-ray with elevated BNP of 1073.  Patient was noted to be hypoxic requiring supplemental oxygen.  TTE 07/08/2020 with LVEF 25-30%, LV with decreased function and global hypokinesis, grade 1 diastolic dysfunction.  Patient was started on aggressive IV diuresis with furosemide with associated 1500 mL fluid restriction with good urine output and was net negative 4.4 L during hospitalization.  Will increase home furosemide to 40 mg p.o. daily.  Patient will require supplemental oxygen for ambulation on discharge with 2 L per nasal cannula.  Outpatient follow-up with cardiology.  Suspect underlying COPD Patient with wheezing on presentation.  Long history of tobacco use disorder.  Patient was treated with duo nebs/Pulmicort during hospitalization with improvement in his symptoms.  Will discharge on Spiriva and Symbicort inhaler.  Outpatient follow-up with PCP.  Left lower extremity DVT Previously on Coumadin, changed to Xarelto recently by oncology.  Vascular duplex ultrasound with age-indeterminate DVT left popliteal vein.  Small cell lung cancer with brain metastases Follows with medical oncology, [name] outpatient.  Currently on systemic chemotherapy with carboplatin, etoposide, Imfinzi.  CT chest with enlargement of spiculated nodule right middle lobe, worsens with new enlarged pretracheal/subcarinal lymph nodes. Decadron 1 mg p.o. daily.  Outpatient follow-up with medical oncology  Recent left femur fracture Patient underwent IM nail to  left femur fracture by Dr. Doreatha Martin on 10/31/2020.  Patient states has appointment with orthopedics outpatient. Orthopedics consulted, repeat imaging left femur stable and recommend outpatient follow-up with orthopedics in 3-4 weeks after discharge for repeat x-rays left femur.  Orthostatic hypotension Midodrine 10 mg p.o. daily  Type 2 diabetes mellitus Home regimen includes Lantus 25 units BID, Humalog 6 units BID.  BPH: Tamsulosin 0.4 mg p.o. daily  Discharge Diagnoses:  Principal Problem:   Acute on chronic systolic congestive heart failure (HCC) Active Problems:   Automatic implantable cardioverter-defibrillator in situ   Hyperlipidemia associated with type 2 diabetes mellitus (HCC)   Left hemiplegia (HCC)   History of CVA with residual deficit   Small cell lung cancer, right upper lobe ([place])   Uncontrolled type 2 diabetes mellitus with hyperglycemia, with long-term current use of insulin ([place])   Brain metastases [facility])    Discharge Instructions  Discharge Instructions    Call MD for:  difficulty breathing, headache or visual disturbances   Complete by: As directed    Call MD for:  extreme fatigue   Complete by: As directed    Call MD for:  persistant dizziness or light-headedness   Complete by: As directed    Call MD for:  persistant nausea and vomiting   Complete by: As directed    Call MD for:  severe uncontrolled pain   Complete by: As directed    Call MD for:  temperature >100.4   Complete by: As directed    Diet - low sodium heart healthy   Complete by: As directed    Increase activity slowly   Complete by: As directed      Allergies as of 12/29/2020   No Known Allergies     Medication List    STOP taking these medications   HYDROcodone-acetaminophen 5-325 MG tablet Commonly known as: NORCO/VICODIN   lidocaine-prilocaine cream Commonly known as:  EMLA   multivitamin with minerals Tabs tablet   sodium chloride 1 g tablet     TAKE these medications   Accu-Chek Softclix Lancets lancets   atorvastatin 20 MG tablet Commonly known as: LIPITOR TAKE 1 TABLET BY MOUTH DAILY AT 6 PM.   budesonide-formoterol 160-4.5 MCG/ACT inhaler Commonly known as: SYMBICORT Inhale 2 puffs into the lungs 2 (two) times daily.   dexamethasone 1 MG tablet Commonly known as: DECADRON Take 1 tablet (1 mg total) by mouth daily.   feeding supplement Liqd Take 237 mLs by mouth 2 (two) times daily between meals.   Fifty50 Glucose Meter 2.0 w/Device Kit Use as instructed   furosemide 40 MG tablet Commonly known as: LASIX Take 1 tablet (40 mg total) by mouth daily. Start taking on: December 30, 2020 What changed:   medication strength  how much to take   insulin lispro 100 UNIT/ML KwikPen Commonly known as: HumaLOG KwikPen Inject 6 Units into the skin 3 (three) times daily. What changed: when to take this   Insupen Ultrafin 31G X 8 MM Misc Generic drug: Insulin Pen Needle   Lantus SoloStar 100 UNIT/ML Solostar Pen Generic drug: insulin glargine Inject 25 Units into the skin 2 (two) times daily. What changed: when to take this   methocarbamol 500 MG tablet Commonly known as: ROBAXIN Take 1 tablet (500 mg total) by mouth every 6 (six) hours as needed for muscle spasms.   midodrine 10 MG tablet Commonly known as: PROAMATINE Take 1 tablet (10 mg total) by mouth 3 (three) times daily with meals. What changed:  when to take this   potassium chloride 10 MEQ tablet Commonly known as: KLOR-CON Take 1 tablet (10 mEq total) by mouth daily.   prochlorperazine 10 MG tablet Commonly known as: COMPAZINE Take 10 mg by mouth every 6 (six) hours as needed for nausea or vomiting.   Rivaroxaban Stater Pack (15 mg and 20 mg) Commonly known as: XARELTO STARTER PACK Follow package directions: Take one 65m tablet by mouth twice a day. On day 22, switch  to one 260mtablet once a day. Take with food.   senna 8.6 MG Tabs tablet Commonly known as: SENOKOT Take 1 tablet (8.6 mg total) by mouth 2 (two) times daily. What changed: when to take this   Spiriva HandiHaler 18 MCG inhalation capsule Generic drug: tiotropium Place 1 capsule (18 mcg total) into inhaler and inhale daily.   tamsulosin 0.4 MG Caps capsule Commonly known as: FLOMAX Take 0.4 mg by mouth daily.   Vitamin D3 25 MCG tablet Commonly known as: Vitamin D Take 2 tablets (2,000 Units total) by mouth 2 (two) times daily.            Durable Medical Equipment  (From admission, onward)         Start     Ordered   12/29/20 1025  For home use only DME oxygen  Once       Question Answer Comment  Length of Need 12 Months   Mode or (Route) Nasal cannula   Liters per Minute 2   Frequency Continuous (stationary and portable oxygen unit needed)   Oxygen conserving device Yes   Oxygen delivery system Gas      12/29/20 1024          Follow-up Information    Haddix, KeThomasene LotMD. Schedule an appointment as soon as possible for a visit in 3 week(s).   Specialty: Orthopedic Surgery Contact information: [phone]        WoZollie PeeMD. Schedule an appointment as soon as possible for a visit in 1 week(s).   Specialty: Internal Medicine Contact information: [phone]        Bensimhon, DaShaune PascalMD. Schedule an appointment as soon as possible for a visit in 2 week(s).   Specialty: Cardiology Contact information: [phone]              No Known Allergies  Consultations:  None   Procedures/Studies: CT Chest W Contrast  Result Date: 12/25/2020 CLINICAL DATA:  Metastatic small-cell lung cancer restaging, known brain metastatic disease EXAM: CT CHEST, ABDOMEN, AND PELVIS WITH CONTRAST  TECHNIQUE: Multidetector CT imaging of the chest, abdomen and pelvis was performed following the standard protocol during bolus administration of intravenous contrast. CONTRAST:  10039mMNIPAQUE IOHEXOL 300 MG/ML SOLN, additional oral enteric contrast COMPARISON:  09/17/2020 FINDINGS: CT CHEST FINDINGS Cardiovascular: Right chest port catheter. Aortic atherosclerosis. Cardiomegaly. Three-vessel coronary artery calcifications. No pericardial effusion. Mediastinum/Nodes: Newly enlarged pretracheal and subcarinal lymph nodes, largest pretracheal nodes measuring 2.4 x 1.7 cm (series 2, image 22). Thyroid gland, trachea, and esophagus demonstrate no significant findings. Lungs/Pleura: New small bilateral pleural effusions with associated atelectasis or consolidation. Moderate centrilobular emphysema. Interval enlargement of a spiculated nodule of the posterolateral segment of the right middle lobe, abutting the major fissure, measuring 1.6 x 1.0 cm, previously 1.1 x 0.7 cm when measured similarly (series 4, image 90). Is  diffuse bilateral interlobular septal thickening. Diffuse bilateral bronchial wall thickening. Musculoskeletal: No chest wall mass or suspicious bone lesions identified. CT ABDOMEN PELVIS FINDINGS Hepatobiliary: No focal liver abnormality is seen. Status post cholecystectomy. No biliary dilatation. Pancreas: Unremarkable. No pancreatic ductal dilatation or surrounding inflammatory changes. Spleen: Normal in size without significant abnormality. Adrenals/Urinary Tract: Stable low-attenuation nodule of the right adrenal gland measuring 2.7 cm, previously non FDG avid and characterized as a benign adenoma (series 2, image 60). Punctuate nonobstructive calculus of the inferior pole of the right kidney. No hydronephrosis bladder is unremarkable. Stomach/Bowel: Stomach is within normal limits. Appendix appears normal. No evidence of bowel wall thickening, distention, or inflammatory changes.  Vascular/Lymphatic: Aortic atherosclerosis. No enlarged abdominal or pelvic lymph nodes. Reproductive: No mass or other abnormality. Other: No abdominal wall hernia or abnormality. No abdominopelvic ascites. Musculoskeletal: No acute or significant osseous findings. IMPRESSION: 1. Interval enlargement of a spiculated nodule of the posterolateral segment of the right middle lobe, consistent with worsened primary lung malignancy. 2. Newly enlarged pretracheal and subcarinal lymph nodes, consistent with nodal metastatic disease. 3. New small bilateral pleural effusions with associated atelectasis or consolidation. There is diffuse bilateral interlobular septal thickening. These findings suggest pulmonary edema but lymphangitic metastatic disease can produce this appearance. No obvious pleural nodularity identified. 4. No evidence of metastatic disease within the abdomen or pelvis. 5. Emphysema. 6. Coronary artery disease. Aortic Atherosclerosis (ICD10-I70.0) and Emphysema (ICD10-J43.9). Electronically Signed   By: Eddie Candle M.D.   On: 12/25/2020 10:05   CT Abdomen Pelvis W Contrast  Result Date: 12/25/2020 CLINICAL DATA:  Metastatic small-cell lung cancer restaging, known brain metastatic disease EXAM: CT CHEST, ABDOMEN, AND PELVIS WITH CONTRAST TECHNIQUE: Multidetector CT imaging of the chest, abdomen and pelvis was performed following the standard protocol during bolus administration of intravenous contrast. CONTRAST:  129m OMNIPAQUE IOHEXOL 300 MG/ML SOLN, additional oral enteric contrast COMPARISON:  09/17/2020 FINDINGS: CT CHEST FINDINGS Cardiovascular: Right chest port catheter. Aortic atherosclerosis. Cardiomegaly. Three-vessel coronary artery calcifications. No pericardial effusion. Mediastinum/Nodes: Newly enlarged pretracheal and subcarinal lymph nodes, largest pretracheal nodes measuring 2.4 x 1.7 cm (series 2, image 22). Thyroid gland, trachea, and esophagus demonstrate no significant findings.  Lungs/Pleura: New small bilateral pleural effusions with associated atelectasis or consolidation. Moderate centrilobular emphysema. Interval enlargement of a spiculated nodule of the posterolateral segment of the right middle lobe, abutting the major fissure, measuring 1.6 x 1.0 cm, previously 1.1 x 0.7 cm when measured similarly (series 4, image 90). Is diffuse bilateral interlobular septal thickening. Diffuse bilateral bronchial wall thickening. Musculoskeletal: No chest wall mass or suspicious bone lesions identified. CT ABDOMEN PELVIS FINDINGS Hepatobiliary: No focal liver abnormality is seen. Status post cholecystectomy. No biliary dilatation. Pancreas: Unremarkable. No pancreatic ductal dilatation or surrounding inflammatory changes. Spleen: Normal in size without significant abnormality. Adrenals/Urinary Tract: Stable low-attenuation nodule of the right adrenal gland measuring 2.7 cm, previously non FDG avid and characterized as a benign adenoma (series 2, image 60). Punctuate nonobstructive calculus of the inferior pole of the right kidney. No hydronephrosis bladder is unremarkable. Stomach/Bowel: Stomach is within normal limits. Appendix appears normal. No evidence of bowel wall thickening, distention, or inflammatory changes. Vascular/Lymphatic: Aortic atherosclerosis. No enlarged abdominal or pelvic lymph nodes. Reproductive: No mass or other abnormality. Other: No abdominal wall hernia or abnormality. No abdominopelvic ascites. Musculoskeletal: No acute or significant osseous findings. IMPRESSION: 1. Interval enlargement of a spiculated nodule of the posterolateral segment of the right middle lobe, consistent with worsened primary lung malignancy. 2.  Newly enlarged pretracheal and subcarinal lymph nodes, consistent with nodal metastatic disease. 3. New small bilateral pleural effusions with associated atelectasis or consolidation. There is diffuse bilateral interlobular septal thickening. These findings  suggest pulmonary edema but lymphangitic metastatic disease can produce this appearance. No obvious pleural nodularity identified. 4. No evidence of metastatic disease within the abdomen or pelvis. 5. Emphysema. 6. Coronary artery disease. Aortic Atherosclerosis (ICD10-I70.0) and Emphysema (ICD10-J43.9). Electronically Signed   By: Eddie Candle M.D.   On: 12/25/2020 10:05   DG CHEST PORT 1 VIEW  Result Date: 12/28/2020 CLINICAL DATA:  Shortness of breath EXAM: PORTABLE CHEST 1 VIEW COMPARISON:  12/25/2020 FINDINGS: Right chest wall port catheter is unchanged. Persistent patchy bilateral opacities. No significant pleural effusion. No pneumothorax. Stable cardiomediastinal contours. Left chest wall ICD. IMPRESSION: Similar patchy bilateral pulmonary opacities and interstitial prominence likely reflecting edema. Right middle lobe nodule chest CT is not. Electronically Signed   By: Macy Mis M.D.   On: 12/28/2020 08:14   DG Chest Port 1 View  Result Date: 12/25/2020 CLINICAL DATA:  Shortness of breath. EXAM: PORTABLE CHEST 1 VIEW COMPARISON:  CT chest 12/24/2020. FINDINGS: Right chest wall port a catheter tip is at the cavoatrial junction. There is a left chest wall pacer device with leads in the right atrial appendage and right ventricle. Aortic atherosclerosis. Diffusely increased interstitial markings identified compatible with pulmonary edema. Small pleural effusions with veil like opacification of the lower lobes identified. Left lung base airspace disease. Chronic interstitial changes of COPD/emphysema. IMPRESSION: 1. Persistent diffuse increase interstitial markings compatible with interstitial edema. Persistent pleural effusions with veil like opacification of the lower lung zones. 2. Asymmetric airspace opacification within the left lower lobe which may reflect pneumonia and or atelectasis. Electronically Signed   By: Kerby Moors M.D.   On: 12/25/2020 16:58   ECHOCARDIOGRAM COMPLETE  Result  Date: 12/26/2020    ECHOCARDIOGRAM REPORT   Patient Name:   Gianni Mihalik. Date of Exam: 12/26/2020 Medical Rec #:  [id number]       Height:       72.0 in Accession #:    [id number]      Weight:       194.9 lb Date of Birth:  01/21/1961        BSA:          2.107 m Patient Age:    60 years        BP:           108/83 mmHg Patient Gender: M               HR:           102 bpm. Exam Location:  Inpatient Procedure: 2D Echo Indications:    Dyspnea R06.00  History:        Patient has prior history of Echocardiogram examinations, most                 recent 07/09/2020. CHF, CAD, Signs/Symptoms:Murmur; Risk                 Factors:Diabetes and Drug abuse.  Sonographer:    Mikki Santee RDCS (AE) Referring Phys: [id number] [place]  1. Left ventricular ejection fraction, by estimation, is 15-20%. The left ventricle has severely decreased function. The left ventricle demonstrates global hypokinesis. The left ventricular internal cavity size was moderately to severely dilated. Indeterminate diastolic filling due to E-A fusion.  2. Right ventricular systolic function is moderately reduced.  The right ventricular size is moderately enlarged. There is normal pulmonary artery systolic pressure. The estimated right ventricular systolic pressure is 81.8 mmHg.  3. Left atrial size was moderately dilated.  4. Right atrial size was moderately dilated.  5. The mitral valve is grossly normal. Severe mitral valve regurgitation. No evidence of mitral stenosis.  6. Tricuspid valve regurgitation is mild to moderate.  7. The aortic valve is tricuspid. Aortic valve regurgitation is trivial. No aortic stenosis is present.  8. The inferior vena cava is dilated in size with >50% respiratory variability, suggesting right atrial pressure of 8 mmHg. Comparison(s): Changes from prior study are noted. EF is now 15-20%. Severe MR is present. FINDINGS  Left Ventricle: Left ventricular ejection fraction, by estimation, is 15-20%. The  left ventricle has severely decreased function. The left ventricle demonstrates global hypokinesis. The left ventricular internal cavity size was moderately to severely dilated. There is no left ventricular hypertrophy. Indeterminate diastolic filling due to E-A fusion. Right Ventricle: The right ventricular size is moderately enlarged. No increase in right ventricular wall thickness. Right ventricular systolic function is moderately reduced. There is normal pulmonary artery systolic pressure. The tricuspid regurgitant velocity is 2.17 m/s, and with an assumed right atrial pressure of 8 mmHg, the estimated right ventricular systolic pressure is 29.9 mmHg. Left Atrium: Left atrial size was moderately dilated. Right Atrium: Right atrial size was moderately dilated. Pericardium: Trivial pericardial effusion is present. Mitral Valve: The mitral valve is grossly normal. Severe mitral valve regurgitation. No evidence of mitral valve stenosis. Tricuspid Valve: The tricuspid valve is grossly normal. Tricuspid valve regurgitation is mild to moderate. No evidence of tricuspid stenosis. Aortic Valve: The aortic valve is tricuspid. Aortic valve regurgitation is trivial. No aortic stenosis is present. Pulmonic Valve: The pulmonic valve was grossly normal. Pulmonic valve regurgitation is mild. No evidence of pulmonic stenosis. Aorta: The aortic root and ascending aorta are structurally normal, with no evidence of dilitation. Venous: The inferior vena cava is dilated in size with greater than 50% respiratory variability, suggesting right atrial pressure of 8 mmHg. IAS/Shunts: The atrial septum is grossly normal. Additional Comments: A device lead is visualized in the right atrium and right ventricle.  LEFT VENTRICLE PLAX 2D LVIDd:         6.50 cm      Diastology LVIDs:         5.60 cm      LV e' medial:  4.43 cm/s LV PW:         1.20 cm      LV e' lateral: 4.65 cm/s LV IVS:        0.90 cm LVOT diam:     2.20 cm LV SV:         29  LV SV Index:   14 LVOT Area:     3.80 cm  LV Volumes (MOD) LV vol d, MOD A2C: 196.0 ml LV vol d, MOD A4C: 160.0 ml LV vol s, MOD A2C: 163.0 ml LV vol s, MOD A4C: 129.0 ml LV SV MOD A2C:     33.0 ml LV SV MOD A4C:     160.0 ml LV SV MOD BP:      37.3 ml RIGHT VENTRICLE RV S prime:     6.98 cm/s TAPSE (M-mode): 1.4 cm LEFT ATRIUM              Index       RIGHT ATRIUM  Index LA diam:        4.40 cm  2.09 cm/m  RA Area:     25.60 cm LA Vol (A2C):   108.0 ml 51.25 ml/m RA Volume:   92.90 ml  44.09 ml/m LA Vol (A4C):   84.2 ml  39.96 ml/m LA Biplane Vol: 94.9 ml  45.04 ml/m  AORTIC VALVE LVOT Vmax:   58.10 cm/s LVOT Vmean:  38.600 cm/s LVOT VTI:    0.077 m  AORTA Ao Root diam: 3.50 cm MR Peak grad:    65.0 mmHg   TRICUSPID VALVE MR Mean grad:    41.0 mmHg   TR Peak grad:   18.8 mmHg MR Vmax:         403.00 cm/s TR Vmax:        217.00 cm/s MR Vmean:        302.0 cm/s MR PISA:         6.28 cm    SHUNTS MR PISA Eff ROA: 59 mm      Systemic VTI:  0.08 m MR PISA Radius:  1.00 cm     Systemic Diam: 2.20 cm Eleonore Chiquito MD Electronically signed by Eleonore Chiquito MD Signature Date/Time: 12/26/2020/1:41:55 PM    Final    CUP PACEART REMOTE DEVICE CHECK  Result Date: 12/06/2020 Alert for tachy therapy disabled. Routing to triage for further evaluation. Known ERI status. HB Pacer[employer] note dated 11/14/20, tachy therapies disabled by industry via order from Dr. Lovena Le.  No plans for gen change Confirmed with industry rep, tachy therapies were disabled at request of Dr. Lovena Le, via device clinic RN as noted in [place]. Tommye Standard, PA-c  DG FEMUR MIN 2 VIEWS LEFT  Result Date: 12/26/2020 CLINICAL DATA:  Fracture follow-up EXAM: LEFT FEMUR 2 VIEWS COMPARISON:  11/13/2020 FINDINGS: Comminuted fracture distal femur unchanged. There is periosteal new bone formation around the fracture compatible with interval healing. ORIF with locking intramedullary rod extending into the distal femur. Rod in satisfactory position.  No complications. IMPRESSION: Early healing of fracture distal femur. Intramedullary rod in good position. Electronically Signed   By: Franchot Gallo M.D.   On: 12/26/2020 13:33   VAS [place] LOWER EXTREMITY VENOUS (DVT)  Result Date: 12/19/2020  Lower Venous DVT Study Indications: LLE swelling.  Risk Factors: Cancer Lung CA. Comparison Study: Previous 05/15/2014 - negative Performing Technologist: Rogelia Rohrer  Examination Guidelines: A complete evaluation includes B-mode imaging, spectral Doppler, color Doppler, and power Doppler as needed of all accessible portions of each vessel. Bilateral testing is considered an integral part of a complete examination. Limited examinations for reoccurring indications may be performed as noted. The reflux portion of the exam is performed with the patient in reverse Trendelenburg.  +---------+---------------+---------+-----------+----------+--------------+ RIGHT    CompressibilityPhasicitySpontaneityPropertiesThrombus Aging +---------+---------------+---------+-----------+----------+--------------+ CFV      Full           Yes      Yes                                 +---------+---------------+---------+-----------+----------+--------------+ SFJ      Full                                                        +---------+---------------+---------+-----------+----------+--------------+ FV Prox  Full  Yes      Yes                                 +---------+---------------+---------+-----------+----------+--------------+ FV Mid   Full           Yes      Yes                                 +---------+---------------+---------+-----------+----------+--------------+ FV DistalFull           Yes      Yes                                 +---------+---------------+---------+-----------+----------+--------------+ PFV      Full                                                         +---------+---------------+---------+-----------+----------+--------------+ POP      Full           Yes      Yes                                 +---------+---------------+---------+-----------+----------+--------------+ PTV      Full                                                        +---------+---------------+---------+-----------+----------+--------------+ PERO     Full                                                        +---------+---------------+---------+-----------+----------+--------------+   +---------+---------------+---------+-----------+----------+-----------------+ LEFT     CompressibilityPhasicitySpontaneityPropertiesThrombus Aging    +---------+---------------+---------+-----------+----------+-----------------+ CFV      Full           Yes      Yes                                    +---------+---------------+---------+-----------+----------+-----------------+ SFJ      Full                                                           +---------+---------------+---------+-----------+----------+-----------------+ FV Prox  Full           Yes      Yes                                    +---------+---------------+---------+-----------+----------+-----------------+ FV Mid   Full  Yes      Yes                                    +---------+---------------+---------+-----------+----------+-----------------+ FV DistalFull           Yes      Yes                                    +---------+---------------+---------+-----------+----------+-----------------+ PFV      Full                                                           +---------+---------------+---------+-----------+----------+-----------------+ POP      Partial        Yes      Yes                  Age Indeterminate +---------+---------------+---------+-----------+----------+-----------------+ PTV      Full                                                            +---------+---------------+---------+-----------+----------+-----------------+ PERO     Full                                                           +---------+---------------+---------+-----------+----------+-----------------+     Summary: BILATERAL: -No evidence of popliteal cyst, bilaterally. RIGHT: - There is no evidence of deep vein thrombosis in the lower extremity. - There is no evidence of superficial venous thrombosis.  LEFT: - Findings consistent with age indeterminate deep vein thrombosis involving the left popliteal vein. - There is no evidence of superficial venous thrombosis.  *See table(s) above for measurements and observations. Electronically signed by Harold Barban MD on 12/19/2020 at 10:07:24 PM.    Final       Subjective: Patient seen and examined at bedside, resting comfortably.  Oxygenating well at rest on room air but on ambulation this morning did desaturate to 87% and will require home oxygen.  Patient otherwise feels much better and ready for discharge home.  No other questions or concerns at this time.  Denies headache, no fever/chills/night sweats, no nausea/vomiting/diarrhea, no chest pain, palpitations, no cough/congestion, no abdominal pain, no paresthesias.  No acute events overnight per nursing staff.  Discharge Exam: Vitals:   12/29/20 0827 12/29/20 1019  BP:  114/88  Pulse:    Resp:    Temp:    SpO2: (!) 89%    Vitals:   12/28/20 2105 12/29/20 0618 12/29/20 0827 12/29/20 1019  BP: 123/82 107/86  114/88  Pulse: (!) 101 (!) 101    Resp: 18 16    Temp: (!) 97.5 F (36.4 C) 97.9 F (36.6 C)    TempSrc: Oral Oral    SpO2: 96% 96% (!) 89%   Weight:  Height:        General: Pt is alert, awake, not in acute distress, chronically ill in appearance Cardiovascular: RRR, S1/S2 +, no rubs, no gallops Respiratory: CTA bilaterally, no wheezing, no rhonchi, on 2 L nasal cannula with ambulation, oxygenating well at rest on room air Abdominal: Soft,  NT, ND, bowel sounds + Extremities: Asymmetric lower extremity edema left greater than right, no cyanosis    The results of significant diagnostics from this hospitalization (including imaging, microbiology, ancillary and laboratory) are listed below for reference.     Microbiology: Recent Results (from the past 240 hour(s))  SARS CORONAVIRUS 2 (TAT 6-24 HRS) Nasopharyngeal Nasopharyngeal Swab     Status: None   Collection Time: 12/25/20  7:24 PM   Specimen: Nasopharyngeal Swab  Result Value Ref Range Status   SARS Coronavirus 2 NEGATIVE NEGATIVE Final    Comment: (NOTE) SARS-CoV-2 target nucleic acids are NOT DETECTED.  The SARS-CoV-2 RNA is generally detectable in upper and lower respiratory specimens during the acute phase of infection. Negative results do not preclude SARS-CoV-2 infection, do not rule out co-infections with other pathogens, and should not be used as the sole basis for treatment or other patient management decisions. Negative results must be combined with clinical observations, patient history, and epidemiological information. The expected result is Negative.  Fact Sheet for Patients: SugarRoll.be  Fact Sheet for Healthcare Providers: https://www.woods-mathews.com/  This test is not yet approved or cleared by the [place] FDA and  has been authorized for detection and/or diagnosis of SARS-CoV-2 by FDA under an Emergency Use Authorization (EUA). This EUA will remain  in effect (meaning this test can be used) for the duration of the COVID-19 declaration under Se ction 564(b)(1) of the Act, 21 U.S.C. section 360bbb-3(b)(1), unless the authorization is terminated or revoked sooner.  Performed at [facility], [place] [address] [place], [place] [address]      Labs: BNP (last 3 results) Recent Labs    10/30/20 2000 12/25/20 1726  BNP 795.1* 6,045.4*   Basic Metabolic Panel: Recent Labs  Lab  12/25/20 1726 12/25/20 1851 12/26/20 0612 12/27/20 0346 12/27/20 1701 12/28/20 0304 12/29/20 0444  NA 143  --  143 142  --  142 140  K 3.5  --  3.5 4.1  --  3.7 3.6  CL 109  --  108 104  --  102 101  CO2 27  --  27 33*  --  28 28  GLUCOSE 103*  --  86 243* 383* 182* 236*  BUN 22*  --  21* 25*  --  36* 36*  CREATININE 1.23  --  1.14 1.36*  --  1.53* 1.41*  CALCIUM 8.6*  --  8.7* 8.8*  --  8.9 8.8*  MG  --  1.7  --  2.0  --   --   --    Liver Function Tests: Recent Labs  Lab 12/25/20 1726 12/26/20 0612  AST 31 29  ALT 67* 57*  ALKPHOS 163* 153*  BILITOT 1.1 1.1  PROT 6.0* 5.6*  ALBUMIN 3.0* 3.0*   No results for input(s): LIPASE, AMYLASE in the last 168 hours. No results for input(s): AMMONIA in the last 168 hours. CBC: Recent Labs  Lab 12/25/20 1726 12/26/20 0612  WBC 10.2 9.2  NEUTROABS 8.0*  --   HGB 10.9* 10.6*  HCT 36.3* 35.5*  MCV 101.7* 101.7*  PLT 212 206   Cardiac Enzymes: No results for input(s): CKTOTAL, CKMB, CKMBINDEX, TROPONINI  in the last 168 hours. BNP: Invalid input(s): POCBNP CBG: Recent Labs  Lab 12/28/20 0737 12/28/20 1258 12/28/20 1635 12/28/20 2100 12/29/20 0742  GLUCAP 199* 208* 104* 191* 202*   D-Dimer No results for input(s): DDIMER in the last 72 hours. Hgb A1c No results for input(s): HGBA1C in the last 72 hours. Lipid Profile No results for input(s): CHOL, HDL, LDLCALC, TRIG, CHOLHDL, LDLDIRECT in the last 72 hours. Thyroid function studies No results for input(s): TSH, T4TOTAL, T3FREE, THYROIDAB in the last 72 hours.  Invalid input(s): FREET3 Anemia work up No results for input(s): VITAMINB12, FOLATE, FERRITIN, TIBC, IRON, RETICCTPCT in the last 72 hours. Urinalysis    Component Value Date/Time   COLORURINE YELLOW 10/30/2020 1151   APPEARANCEUR CLEAR 10/30/2020 1151   LABSPEC 1.018 10/30/2020 1151   PHURINE 5.0 10/30/2020 1151   GLUCOSEU >=500 (A) 10/30/2020 1151   HGBUR MODERATE (A) 10/30/2020 1151    BILIRUBINUR NEGATIVE 10/30/2020 1151   KETONESUR NEGATIVE 10/30/2020 1151   PROTEINUR 100 (A) 10/30/2020 1151   UROBILINOGEN 1.0 07/16/2015 1715   NITRITE NEGATIVE 10/30/2020 1151   LEUKOCYTESUR NEGATIVE 10/30/2020 1151   Sepsis Labs Invalid input(s): PROCALCITONIN,  WBC,  LACTICIDVEN Microbiology Recent Results (from the past 240 hour(s))  SARS CORONAVIRUS 2 (TAT 6-24 HRS) Nasopharyngeal Nasopharyngeal Swab     Status: None   Collection Time: 12/25/20  7:24 PM   Specimen: Nasopharyngeal Swab  Result Value Ref Range Status   SARS Coronavirus 2 NEGATIVE NEGATIVE Final    Comment: (NOTE) SARS-CoV-2 target nucleic acids are NOT DETECTED.  The SARS-CoV-2 RNA is generally detectable in upper and lower respiratory specimens during the acute phase of infection. Negative results do not preclude SARS-CoV-2 infection, do not rule out co-infections with other pathogens, and should not be used as the sole basis for treatment or other patient management decisions. Negative results must be combined with clinical observations, patient history, and epidemiological information. The expected result is Negative.  Fact Sheet for Patients: SugarRoll.be  Fact Sheet for Healthcare Providers: https://www.woods-mathews.com/  This test is not yet approved or cleared by the [place] FDA and  has been authorized for detection and/or diagnosis of SARS-CoV-2 by FDA under an Emergency Use Authorization (EUA). This EUA will remain  in effect (meaning this test can be used) for the duration of the COVID-19 declaration under Se ction 564(b)(1) of the Act, 21 U.S.C. section 360bbb-3(b)(1), unless the authorization is terminated or revoked sooner.  Performed at [facility], [place] [address], [place], [place] [address]      Time coordinating discharge: Over 30 minutes  SIGNED:   Torrell Krutz J [place] DO  [facility] 12/29/2020, 10:34 AM

## 2020-12-29 NOTE — Progress Notes (Signed)
Pt right chest port de-accessed by IV team. Discharge instructions reviewed with patient and patients family, all questions answered. Pt educated on how to work his portable oxygen tank.   Pt family member informed this RN that the patients pharmacy would not open until Monday and that he would not be able to pick up his inhalers until then. Dr. [place] was informed and gave verbal orders that it was ok to still d/c patient home and that he could resume his inhaler schedule starting Monday. Pt family member also said that they had not been able to fill the potassium prescription that was previously given to them. Dr. [place] said that it was ok to resume his potassium on Monday as well once his pharmacy opened.

## 2020-12-29 NOTE — Progress Notes (Signed)
Physical Therapy Treatment Patient Details Name: Dennis [name]. MRN: [id number] DOB: 1961/05/01 Today's Date: 12/29/2020    History of Present Illness Dennis [name]. is a 60 y.o. 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    PT Comments    Pt very agreeable to ambulate and remained on 2L O2 [place] since RN reports saturations dropped on room air this morning.  Pt required one seated rest break and denies increased dyspnea today with activity.     Follow Up Recommendations  Home health PT;Supervision/Assistance - 24 hour     Equipment Recommendations  None recommended by PT    Recommendations for Other Services       Precautions / Restrictions Precautions Precautions: Fall Precaution Comments: monitor sats Restrictions Weight Bearing Restrictions: No    Mobility  Bed Mobility Overal bed mobility: Modified Independent             General bed mobility comments: increased time but no assist required    Transfers Overall transfer level: Needs assistance Equipment used: Rolling walker (2 wheeled) Transfers: Sit to/from Stand Sit to Stand: Min guard         General transfer comment: min/guard for safety, cues for hand placement  Ambulation/Gait Ambulation/Gait assistance: Min guard Gait Distance (Feet): 60 Feet Assistive device: Rolling walker (2 wheeled) Gait Pattern/deviations: Step-through pattern;Antalgic;Decreased stance time - left     General Gait Details: min/guard for safety, initially tried to ambulate without assistive device however gait antalgic and requiring UE support so provided RW, pt performed 20' and then 45' with seated rest break, remained on 2L O2 [place] and Spo2 96-98% during ambulation   Stairs              Wheelchair Mobility    Modified Rankin (Stroke Patients Only)       Balance                                            Cognition Arousal/Alertness: Awake/alert Behavior During Therapy: WFL for tasks assessed/performed Overall Cognitive Status: Within Functional Limits for tasks assessed                                        Exercises      General Comments        Pertinent Vitals/Pain Pain Assessment: No/denies pain    Home Living                      Prior Function            PT Goals (current goals can now be found in the care plan section) Progress towards PT goals: Progressing toward goals    Frequency    Min 3X/week      PT Plan Current plan remains appropriate    Co-evaluation              AM-PAC PT "6 Clicks" Mobility   Outcome Measure  Help needed turning from your back to your side while in a flat bed without using bedrails?: A Little  Help needed moving from lying on your back to sitting on the side of a flat bed without using bedrails?: A Little Help needed moving to and from a bed to a chair (including a wheelchair)?: A Little Help needed standing up from a chair using your arms (e.g., wheelchair or bedside chair)?: A Little Help needed to walk in hospital room?: A Little Help needed climbing 3-5 steps with a railing? : A Little 6 Click Score: 18    End of Session Equipment Utilized During Treatment: Gait belt;Oxygen Activity Tolerance: Patient limited by fatigue Patient left: in chair;with call bell/phone within reach Nurse Communication: Mobility status PT Visit Diagnosis: Other abnormalities of gait and mobility (R26.89)     Time: 3235-5732 PT Time Calculation (min) (ACUTE ONLY): 21 min  Charges:  $Gait Training: 8-22 mins                     Arlyce Dice, DPT Acute Rehabilitation Services Pager: [phone] Office: [phone]  [place] Ram E 12/29/2020,  1:06 PM

## 2020-12-29 NOTE — TOC Progression Note (Signed)
Transition of Care (TOC) - Progression Note    Patient Details  Name: Dennis [name]. MRN: [id number] Date of Birth: May 01, 1961  Transition of Care [facility]) CM/SW Contact  Joaquin Courts, RN Phone Number: 12/29/2020, 10:38 AM  Clinical Narrative:    Rip [place] given referral for home oxygen.   Expected Discharge Plan: [place] Barriers to Discharge: Continued Medical Work up  Expected Discharge Plan and Services Expected Discharge Plan: [place]   Discharge Planning Services: CM Consult Post Acute Care Choice: [place] arrangements for the past 2 months: Single Family Home Expected Discharge Date: 12/29/20               DME Arranged: Oxygen DME Agency: Other - Comment [facility]) Date DME Agency Contacted: 12/29/20 Time DME Agency Contacted: 1025 Representative spoke with at DME Agency: [place] Determinants of Health ([place]) Interventions    Readmission Risk Interventions No flowsheet data found.

## 2020-12-29 NOTE — Progress Notes (Signed)
SATURATION QUALIFICATIONS: (This note is used to comply with regulatory documentation for home oxygen)  Patient Saturations on Room Air at Rest = 91%  Patient Saturations on Room Air while Ambulating = 87%  Patient Saturations on 2 Liters of oxygen while Ambulating = 94%  Please briefly explain why patient needs home oxygen:With ambulation patient desaturates to 87%

## 2021-01-02 ENCOUNTER — Inpatient Hospital Stay: Payer: Medicare Other

## 2021-01-02 ENCOUNTER — Inpatient Hospital Stay (HOSPITAL_BASED_OUTPATIENT_CLINIC_OR_DEPARTMENT_OTHER): Payer: Medicare Other | Admitting: Internal Medicine

## 2021-01-02 ENCOUNTER — Other Ambulatory Visit: Payer: Self-pay

## 2021-01-02 VITALS — BP 97/78 | HR 79 | Temp 98.1°F | Resp 14 | Ht 72.0 in | Wt 184.9 lb

## 2021-01-02 DIAGNOSIS — Z5112 Encounter for antineoplastic immunotherapy: Secondary | ICD-10-CM | POA: Diagnosis not present

## 2021-01-02 DIAGNOSIS — I509 Heart failure, unspecified: Secondary | ICD-10-CM | POA: Diagnosis not present

## 2021-01-02 DIAGNOSIS — I82402 Acute embolism and thrombosis of unspecified deep veins of left lower extremity: Secondary | ICD-10-CM | POA: Diagnosis not present

## 2021-01-02 DIAGNOSIS — Z8616 Personal history of COVID-19: Secondary | ICD-10-CM | POA: Diagnosis not present

## 2021-01-02 DIAGNOSIS — Z5111 Encounter for antineoplastic chemotherapy: Secondary | ICD-10-CM

## 2021-01-02 DIAGNOSIS — Z9981 Dependence on supplemental oxygen: Secondary | ICD-10-CM | POA: Diagnosis not present

## 2021-01-02 DIAGNOSIS — Z7901 Long term (current) use of anticoagulants: Secondary | ICD-10-CM | POA: Diagnosis not present

## 2021-01-02 DIAGNOSIS — J811 Chronic pulmonary edema: Secondary | ICD-10-CM | POA: Diagnosis not present

## 2021-01-02 DIAGNOSIS — C3490 Malignant neoplasm of unspecified part of unspecified bronchus or lung: Secondary | ICD-10-CM | POA: Diagnosis not present

## 2021-01-02 DIAGNOSIS — C342 Malignant neoplasm of middle lobe, bronchus or lung: Secondary | ICD-10-CM | POA: Diagnosis not present

## 2021-01-02 DIAGNOSIS — Z79899 Other long term (current) drug therapy: Secondary | ICD-10-CM | POA: Diagnosis not present

## 2021-01-02 DIAGNOSIS — C7931 Secondary malignant neoplasm of brain: Secondary | ICD-10-CM

## 2021-01-02 DIAGNOSIS — Z87891 Personal history of nicotine dependence: Secondary | ICD-10-CM | POA: Diagnosis not present

## 2021-01-02 DIAGNOSIS — J90 Pleural effusion, not elsewhere classified: Secondary | ICD-10-CM | POA: Diagnosis not present

## 2021-01-02 LAB — CMP (CANCER CENTER ONLY)
ALT: 41 U/L (ref 0–44)
AST: 21 U/L (ref 15–41)
Albumin: 2.7 g/dL — ABNORMAL LOW (ref 3.5–5.0)
Alkaline Phosphatase: 147 U/L — ABNORMAL HIGH (ref 38–126)
Anion gap: 11 (ref 5–15)
BUN: 29 mg/dL — ABNORMAL HIGH (ref 6–20)
CO2: 28 mmol/L (ref 22–32)
Calcium: 9.2 mg/dL (ref 8.9–10.3)
Chloride: 108 mmol/L (ref 98–111)
Creatinine: 1.5 mg/dL — ABNORMAL HIGH (ref 0.61–1.24)
GFR, Estimated: 54 mL/min — ABNORMAL LOW (ref >60)
Glucose, Bld: 90 mg/dL (ref 70–99)
Potassium: 3.3 mmol/L — ABNORMAL LOW (ref 3.5–5.1)
Sodium: 147 mmol/L — ABNORMAL HIGH (ref 135–145)
Total Bilirubin: 0.6 mg/dL (ref 0.3–1.2)
Total Protein: 6.3 g/dL — ABNORMAL LOW (ref 6.5–8.1)

## 2021-01-02 LAB — CBC WITH DIFFERENTIAL (CANCER CENTER ONLY)
Abs Immature Granulocytes: 0.04 10*3/uL (ref 0.00–0.07)
Basophils Absolute: 0 10*3/uL (ref 0.0–0.1)
Basophils Relative: 0 %
Eosinophils Absolute: 0.2 10*3/uL (ref 0.0–0.5)
Eosinophils Relative: 2 %
HCT: 39.1 % (ref 39.0–52.0)
Hemoglobin: 11.8 g/dL — ABNORMAL LOW (ref 13.0–17.0)
Immature Granulocytes: 0 %
Lymphocytes Relative: 18 %
Lymphs Abs: 1.9 10*3/uL (ref 0.7–4.0)
MCH: 29.7 pg (ref 26.0–34.0)
MCHC: 30.2 g/dL (ref 30.0–36.0)
MCV: 98.5 fL (ref 80.0–100.0)
Monocytes Absolute: 0.9 10*3/uL (ref 0.1–1.0)
Monocytes Relative: 9 %
Neutro Abs: 7.6 10*3/uL (ref 1.7–7.7)
Neutrophils Relative %: 71 %
Platelet Count: 223 10*3/uL (ref 150–400)
RBC: 3.97 MIL/uL — ABNORMAL LOW (ref 4.22–5.81)
RDW: 17.3 % — ABNORMAL HIGH (ref 11.5–15.5)
WBC Count: 10.6 10*3/uL — ABNORMAL HIGH (ref 4.0–10.5)
nRBC: 0 % (ref 0.0–0.2)

## 2021-01-02 NOTE — Progress Notes (Signed)
DISCONTINUE ON PATHWAY REGIMEN - Small Cell Lung     Cycles 1 through 4: A cycle is every 21 days:     Durvalumab      Carboplatin      Etoposide    Cycles 5 and beyond: A cycle is every 28 days:     Durvalumab   **Always confirm dose/schedule in your pharmacy ordering system**  REASON: Disease Progression PRIOR TREATMENT: LOS420: Durvalumab 1,500 mg D1 + Carboplatin AUC=5 D1 + Etoposide 100 mg/m2 D1-3 q21 Days x 4 Cycles, Followed by Durvalumab 1,500 mg q28 Days Until Progression or Unacceptable Toxicity TREATMENT RESPONSE: Partial Response (PR)  START ON PATHWAY REGIMEN - Small Cell Lung     Cycles 1 through 4, every 21 days:     Atezolizumab      Carboplatin      Etoposide    Cycles 5 and beyond, every 21 days:     Atezolizumab   **Always confirm dose/schedule in your pharmacy ordering system**  Patient Characteristics: Relapsed or Progressive Disease, Second Line, Relapse > 6 Months Therapeutic Status: Relapsed or Progressive Disease Line of Therapy: Second Line Time to Relapse: Relapse > 6 Months Intent of Therapy: Non-Curative / Palliative Intent, Discussed with Patient

## 2021-01-02 NOTE — Progress Notes (Signed)
[place] [phone]   [phone]  OFFICE PROGRESS NOTE  [name], MD [place] [address]  DIAGNOSIS: Extensive stage small cell lung cancer. He presented with a spiculated right middle lobe pulmonary nodule and mildly enlargedsolitary aortopulmonary lymph node. 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 He was diagnosed in March 2021.  PRIOR THERAPY: 1) Whole brain radiation under the care of Dr. Lisbeth Renshaw. Completed 01/17/2020 2) Systemic chemotherapy withcarboplatin for an AUC of 5 on day 1,etoposide 100 mg/m on days 1, 2, and 3, and Imfinzi 1500 mg on day 1 IV every 3 weeks. First dose expected on4/20/21. Status post 7  cycles.  Starting from cycle #5 the patient will be treated with maintenance Imfinzi 1500 mg IV every 4 weeks.  He has been off treatment for the last 4 months.   CURRENT THERAPY:  Resuming systemic chemotherapy with carboplatin for AUC of 5 on day 1, etoposide 100 mg/M2 on days 1, 2 and 3 with Cosela 240 mg/M2 before chemotherapy as well as Tecentriq 1200 mg IV every 3 weeks.  First dose January 15, 2021.  INTERVAL HISTORY: Anothy Bufano. 60 y.o. male returns to the clinic today for follow-up visit accompanied by his brother.  The patient continues to have swelling of the lower extremities left more than right.  He was admitted recently to the hospital with worsening congestive heart failure and he had aggressive diuresis during his admission.  He is feeling a little bit better but he still require home oxygen and currently on oxygen 3 L/minute.  The patient denied having any current chest pain but has mild cough with no hemoptysis.  He denied having any fever or chills.  He has no nausea, vomiting, diarrhea or constipation.  He denied having any headache or visual changes.  He was found on previous CT scan of the chest to have evidence for  disease progression.  The patient is here today for reevaluation and discussion of his treatment options.   MEDICAL HISTORY: Past Medical History:  Diagnosis Date  . Alcohol abuse with alcohol-induced mood disorder ([place]) 08/18/2015  . Automatic implantable cardioverter-defibrillator in situ 2012   S/P St Jude Dual Chamber. ICD.  Marland Kitchen CAD (coronary artery disease) 11/16/2014   CAD Coronary angiography (12/2008) with mid LAD totally occluded and collateralized.  . Cardiac LV ejection fraction 10-20%   . Chest pain 09/04/2018  . CHF (congestive heart failure) ([place])   . Chronic systolic heart failure (HCC)    a) Mixed ICM/NICM b) RHC (05/2014): RA 2, RV 19/2/3, PA 22/14 (18), PCWP 6, Fick CO/CI: 5.2 / 2.7, PVR 2.3 WU, PA 60% and 64% c) ECHO (05/2014): EF 20-25%, diff HK, akinesis entireanteroseptal myocardium, triv AI, mod MR, LA mod/sev dilated  . Cocaine abuse ([place]) 01/24/2015  . Cocaine abuse with cocaine-induced mood disorder ([place]) 08/20/2015  . Drug abuse and dependence ([place])   . Dysphagia following cerebral infarction 01/24/2015  . Embolic stroke involving right middle cerebral artery ([place])   . Extensive stage primary small cell carcinoma of lung ([place]) 01/04/2020  . H/O noncompliance with medical treatment, presenting hazards to health 01/24/2015  . Heart murmur   . High cholesterol   . Ischemic cardiomyopathy    a) Coronary angiography (12/2008) at [facility]: L[place]: nl, LAD mid 100% stenosis with left to left and right-to-left collaterals to the distal LAD; Lcx: nl,  RCA nl.    . 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[place])   . MDD (major depressive disorder), recurrent severe, without psychosis ([place]) 08/18/2015  . Open wound of foot 02/27/2015  . Pneumonia 1988  . Psychoactive substance-induced mood disorder ([place]) 07/17/2015  . Sleep apnea    "cleared after T&A"  . Status post PICC central line placement   . Stroke ([place])   . Substance induced mood disorder ([place]) 08/17/2015  . Type II diabetes mellitus  (HCC)     ALLERGIES:  has No Known Allergies.  MEDICATIONS:  Current Outpatient Medications  Medication Sig Dispense Refill  . Accu-Chek Softclix Lancets lancets     . atorvastatin (LIPITOR) 20 MG tablet TAKE 1 TABLET BY MOUTH DAILY AT 6 PM. 30 tablet 6  . Blood Glucose Monitoring Suppl (FIFTY50 GLUCOSE METER 2.0) w/Device KIT Use as instructed    . budesonide-formoterol (SYMBICORT) 160-4.5 MCG/ACT inhaler Inhale 2 puffs into the lungs 2 (two) times daily. 1 each 12  . dexamethasone (DECADRON) 1 MG tablet Take 1 tablet (1 mg total) by mouth daily. 60 tablet 2  . feeding supplement (ENSURE ENLIVE / ENSURE PLUS) LIQD Take 237 mLs by mouth 2 (two) times daily between meals. 237 mL 0  . furosemide (LASIX) 40 MG tablet Take 1 tablet (40 mg total) by mouth daily. 90 tablet 0  . insulin glargine (LANTUS SOLOSTAR) 100 UNIT/ML Solostar Pen Inject 25 Units into the skin 2 (two) times daily. (Patient taking differently: Inject 25 Units into the skin in the morning and at bedtime.) 15 mL 2  . insulin lispro (HUMALOG KWIKPEN) 100 UNIT/ML KwikPen Inject 6 Units into the skin 3 (three) times daily. (Patient taking differently: Inject 6 Units into the skin 2 (two) times daily.) 15 mL 1  . INSUPEN ULTRAFIN 31G X 8 MM MISC     . methocarbamol (ROBAXIN) 500 MG tablet Take 1 tablet (500 mg total) by mouth every 6 (six) hours as needed for muscle spasms.    . midodrine (PROAMATINE) 10 MG tablet Take 1 tablet (10 mg total) by mouth 3 (three) times daily with meals. (Patient taking differently: Take 10 mg by mouth daily.) 90 tablet 2  . potassium chloride (KLOR-CON) 10 MEQ tablet Take 1 tablet (10 mEq total) by mouth daily. 90 each 0  . prochlorperazine (COMPAZINE) 10 MG tablet Take 10 mg by mouth every 6 (six) hours as needed for nausea or vomiting.    Marland Kitchen RIVAROXABAN (XARELTO) VTE STARTER PACK (15 & 20 MG) Follow package directions: Take one $Remove'15mg'ycCIGab$  tablet by mouth twice a day. On day 22, switch to one $Remo'20mg'ASeIY$  tablet once  a day. Take with food. 51 each 0  . senna (SENOKOT) 8.6 MG TABS tablet Take 1 tablet (8.6 mg total) by mouth 2 (two) times daily. (Patient taking differently: Take 1 tablet by mouth every other day.) 120 tablet 0  . tamsulosin (FLOMAX) 0.4 MG CAPS capsule Take 0.4 mg by mouth daily.    Marland Kitchen tiotropium (SPIRIVA HANDIHALER) 18 MCG inhalation capsule Place 1 capsule (18 mcg total) into inhaler and inhale daily. 90 capsule 3  . Vitamin D3 (VITAMIN D) 25 MCG tablet Take 2 tablets (2,000 Units total) by mouth 2 (two) times daily. 120 tablet 0   No current facility-administered medications for this visit.    SURGICAL HISTORY:  Past Surgical History:  Procedure Laterality Date  . BRONCHIAL BRUSHINGS  12/28/2019   Procedure: BRONCHIAL BRUSHINGS;  Surgeon: Garner Nash, DO;  Location: [facility];  Service: Thoracic;;  . BRONCHIAL NEEDLE ASPIRATION BIOPSY  12/28/2019   Procedure: BRONCHIAL NEEDLE ASPIRATION BIOPSIES;  Surgeon: Garner Nash, DO;  Location: [facility];  Service: Thoracic;;  . BRONCHIAL WASHINGS  12/28/2019   Procedure: BRONCHIAL WASHINGS;  Surgeon: Garner Nash, DO;  Location: [facility];  Service: Thoracic;;  . CARDIAC CATHETERIZATION  05/2014  . CARDIAC DEFIBRILLATOR PLACEMENT  03/2011  . CHOLECYSTECTOMY  1994  . FEMUR IM NAIL Left 10/31/2020   Procedure: INTRAMEDULLARY (IM) RETROGRADE FEMORAL NAILING;  Surgeon: Shona Needles, MD;  Location: [facility];  Service: Orthopedics;  Laterality: Left;  . FOREARM FRACTURE SURGERY Left 1980  . FRACTURE SURGERY    . IR IMAGING GUIDED PORT INSERTION  10/11/2020  . RIGHT HEART CATHETERIZATION N/A 05/30/2014   Procedure: RIGHT HEART CATH;  Surgeon: Jolaine Artist, MD;  Location[facility] CATH LAB;  Service: Cardiovascular;  Laterality: N/A;  . RIGHT HEART CATHETERIZATION N/A 02/07/2015   Procedure: RIGHT HEART CATH;  Surgeon: Jolaine Artist, MD;  Location[facility];  Service: Cardiovascular;  Laterality: N/A;  . TONSILLECTOMY AND  ADENOIDECTOMY  ~ 1998  . VIDEO BRONCHOSCOPY WITH ENDOBRONCHIAL ULTRASOUND N/A 12/28/2019   Procedure: VIDEO BRONCHOSCOPY WITH ENDOBRONCHIAL ULTRASOUND;  Surgeon: Garner Nash, DO;  Location: [facility];  Service: Thoracic;  Laterality: N/A;    REVIEW OF SYSTEMS:  Constitutional: positive for fatigue Eyes: negative Ears, nose, mouth, throat, and face: negative Respiratory: positive for dyspnea on exertion Cardiovascular: positive for tachypnea Gastrointestinal: negative Genitourinary:negative Integument/breast: negative Hematologic/lymphatic: negative Musculoskeletal:positive for muscle weakness Neurological: negative Behavioral/Psych: negative Endocrine: negative Allergic/Immunologic: negative   PHYSICAL EXAMINATION: General appearance: alert, cooperative, fatigued and no distress Head: Normocephalic, without obvious abnormality, atraumatic Neck: no adenopathy, no JVD, supple, symmetrical, trachea midline and thyroid not enlarged, symmetric, no tenderness/mass/nodules Lymph nodes: Cervical, supraclavicular, and axillary nodes normal. Resp: clear to auscultation bilaterally Back: symmetric, no curvature. ROM normal. No CVA tenderness. Cardio: regular rate and rhythm, S1, S2 normal, no murmur, click, rub or gallop GI: soft, non-tender; bowel sounds normal; no masses,  no organomegaly Extremities: edema 1+ edema of the left lower extremity Neurologic: Alert and oriented X 3, normal strength and tone. Normal symmetric reflexes. Normal coordination and gait  ECOG PERFORMANCE STATUS: 2 - Symptomatic, <50% confined to bed  Blood pressure 97/78, pulse 79, temperature 98.1 F (36.7 C), temperature source Tympanic, resp. rate 14, height 6' (1.829 m), weight 184 lb 14.4 oz (83.9 kg), SpO2 100 %.  LABORATORY DATA: Lab Results  Component Value Date   WBC 10.6 (H) 01/02/2021   HGB 11.8 (L) 01/02/2021   HCT 39.1 01/02/2021   MCV 98.5 01/02/2021   PLT 223 01/02/2021      Chemistry       Component Value Date/Time   NA 140 12/29/2020 0444   K 3.6 12/29/2020 0444   CL 101 12/29/2020 0444   CO2 28 12/29/2020 0444   BUN 36 (H) 12/29/2020 0444   CREATININE 1.41 (H) 12/29/2020 0444   CREATININE 1.17 12/18/2020 1235      Component Value Date/Time   CALCIUM 8.8 (L) 12/29/2020 0444   ALKPHOS 153 (H) 12/26/2020 0612   AST 29 12/26/2020 0612   AST 28 12/18/2020 1235   ALT 57 (H) 12/26/2020 0612   ALT 45 (H) 12/18/2020 1235   BILITOT 1.1 12/26/2020 0612   BILITOT 0.5 12/18/2020 1235       RADIOGRAPHIC STUDIES: CT Chest W Contrast  Result Date: 12/25/2020 CLINICAL DATA:  Metastatic  small-cell lung cancer restaging, known brain metastatic disease EXAM: CT CHEST, ABDOMEN, AND PELVIS WITH CONTRAST TECHNIQUE: Multidetector CT imaging of the chest, abdomen and pelvis was performed following the standard protocol during bolus administration of intravenous contrast. CONTRAST:  158mL OMNIPAQUE IOHEXOL 300 MG/ML SOLN, additional oral enteric contrast COMPARISON:  09/17/2020 FINDINGS: CT CHEST FINDINGS Cardiovascular: Right chest port catheter. Aortic atherosclerosis. Cardiomegaly. Three-vessel coronary artery calcifications. No pericardial effusion. Mediastinum/Nodes: Newly enlarged pretracheal and subcarinal lymph nodes, largest pretracheal nodes measuring 2.4 x 1.7 cm (series 2, image 22). Thyroid gland, trachea, and esophagus demonstrate no significant findings. Lungs/Pleura: New small bilateral pleural effusions with associated atelectasis or consolidation. Moderate centrilobular emphysema. Interval enlargement of a spiculated nodule of the posterolateral segment of the right middle lobe, abutting the major fissure, measuring 1.6 x 1.0 cm, previously 1.1 x 0.7 cm when measured similarly (series 4, image 90). Is diffuse bilateral interlobular septal thickening. Diffuse bilateral bronchial wall thickening. Musculoskeletal: No chest wall mass or suspicious bone lesions identified. CT  ABDOMEN PELVIS FINDINGS Hepatobiliary: No focal liver abnormality is seen. Status post cholecystectomy. No biliary dilatation. Pancreas: Unremarkable. No pancreatic ductal dilatation or surrounding inflammatory changes. Spleen: Normal in size without significant abnormality. Adrenals/Urinary Tract: Stable low-attenuation nodule of the right adrenal gland measuring 2.7 cm, previously non FDG avid and characterized as a benign adenoma (series 2, image 60). Punctuate nonobstructive calculus of the inferior pole of the right kidney. No hydronephrosis bladder is unremarkable. Stomach/Bowel: Stomach is within normal limits. Appendix appears normal. No evidence of bowel wall thickening, distention, or inflammatory changes. Vascular/Lymphatic: Aortic atherosclerosis. No enlarged abdominal or pelvic lymph nodes. Reproductive: No mass or other abnormality. Other: No abdominal wall hernia or abnormality. No abdominopelvic ascites. Musculoskeletal: No acute or significant osseous findings. IMPRESSION: 1. Interval enlargement of a spiculated nodule of the posterolateral segment of the right middle lobe, consistent with worsened primary lung malignancy. 2. Newly enlarged pretracheal and subcarinal lymph nodes, consistent with nodal metastatic disease. 3. New small bilateral pleural effusions with associated atelectasis or consolidation. There is diffuse bilateral interlobular septal thickening. These findings suggest pulmonary edema but lymphangitic metastatic disease can produce this appearance. No obvious pleural nodularity identified. 4. No evidence of metastatic disease within the abdomen or pelvis. 5. Emphysema. 6. Coronary artery disease. Aortic Atherosclerosis (ICD10-I70.0) and Emphysema (ICD10-J43.9). Electronically Signed   By: Eddie Candle M.D.   On: 12/25/2020 10:05   CT Abdomen Pelvis W Contrast  Result Date: 12/25/2020 CLINICAL DATA:  Metastatic small-cell lung cancer restaging, known brain metastatic disease  EXAM: CT CHEST, ABDOMEN, AND PELVIS WITH CONTRAST TECHNIQUE: Multidetector CT imaging of the chest, abdomen and pelvis was performed following the standard protocol during bolus administration of intravenous contrast. CONTRAST:  164mL OMNIPAQUE IOHEXOL 300 MG/ML SOLN, additional oral enteric contrast COMPARISON:  09/17/2020 FINDINGS: CT CHEST FINDINGS Cardiovascular: Right chest port catheter. Aortic atherosclerosis. Cardiomegaly. Three-vessel coronary artery calcifications. No pericardial effusion. Mediastinum/Nodes: Newly enlarged pretracheal and subcarinal lymph nodes, largest pretracheal nodes measuring 2.4 x 1.7 cm (series 2, image 22). Thyroid gland, trachea, and esophagus demonstrate no significant findings. Lungs/Pleura: New small bilateral pleural effusions with associated atelectasis or consolidation. Moderate centrilobular emphysema. Interval enlargement of a spiculated nodule of the posterolateral segment of the right middle lobe, abutting the major fissure, measuring 1.6 x 1.0 cm, previously 1.1 x 0.7 cm when measured similarly (series 4, image 90). Is diffuse bilateral interlobular septal thickening. Diffuse bilateral bronchial wall thickening. Musculoskeletal: No chest wall mass or suspicious bone lesions identified. CT  ABDOMEN PELVIS FINDINGS Hepatobiliary: No focal liver abnormality is seen. Status post cholecystectomy. No biliary dilatation. Pancreas: Unremarkable. No pancreatic ductal dilatation or surrounding inflammatory changes. Spleen: Normal in size without significant abnormality. Adrenals/Urinary Tract: Stable low-attenuation nodule of the right adrenal gland measuring 2.7 cm, previously non FDG avid and characterized as a benign adenoma (series 2, image 60). Punctuate nonobstructive calculus of the inferior pole of the right kidney. No hydronephrosis bladder is unremarkable. Stomach/Bowel: Stomach is within normal limits. Appendix appears normal. No evidence of bowel wall thickening,  distention, or inflammatory changes. Vascular/Lymphatic: Aortic atherosclerosis. No enlarged abdominal or pelvic lymph nodes. Reproductive: No mass or other abnormality. Other: No abdominal wall hernia or abnormality. No abdominopelvic ascites. Musculoskeletal: No acute or significant osseous findings. IMPRESSION: 1. Interval enlargement of a spiculated nodule of the posterolateral segment of the right middle lobe, consistent with worsened primary lung malignancy. 2. Newly enlarged pretracheal and subcarinal lymph nodes, consistent with nodal metastatic disease. 3. New small bilateral pleural effusions with associated atelectasis or consolidation. There is diffuse bilateral interlobular septal thickening. These findings suggest pulmonary edema but lymphangitic metastatic disease can produce this appearance. No obvious pleural nodularity identified. 4. No evidence of metastatic disease within the abdomen or pelvis. 5. Emphysema. 6. Coronary artery disease. Aortic Atherosclerosis (ICD10-I70.0) and Emphysema (ICD10-J43.9). Electronically Signed   By: Eddie Candle M.D.   On: 12/25/2020 10:05   DG CHEST PORT 1 VIEW  Result Date: 12/28/2020 CLINICAL DATA:  Shortness of breath EXAM: PORTABLE CHEST 1 VIEW COMPARISON:  12/25/2020 FINDINGS: Right chest wall port catheter is unchanged. Persistent patchy bilateral opacities. No significant pleural effusion. No pneumothorax. Stable cardiomediastinal contours. Left chest wall ICD. IMPRESSION: Similar patchy bilateral pulmonary opacities and interstitial prominence likely reflecting edema. Right middle lobe nodule chest CT is not. Electronically Signed   By: Macy Mis M.D.   On: 12/28/2020 08:14   DG Chest Port 1 View  Result Date: 12/25/2020 CLINICAL DATA:  Shortness of breath. EXAM: PORTABLE CHEST 1 VIEW COMPARISON:  CT chest 12/24/2020. FINDINGS: Right chest wall port a catheter tip is at the cavoatrial junction. There is a left chest wall pacer device with leads  in the right atrial appendage and right ventricle. Aortic atherosclerosis. Diffusely increased interstitial markings identified compatible with pulmonary edema. Small pleural effusions with veil like opacification of the lower lobes identified. Left lung base airspace disease. Chronic interstitial changes of COPD/emphysema. IMPRESSION: 1. Persistent diffuse increase interstitial markings compatible with interstitial edema. Persistent pleural effusions with veil like opacification of the lower lung zones. 2. Asymmetric airspace opacification within the left lower lobe which may reflect pneumonia and or atelectasis. Electronically Signed   By: Kerby Moors M.D.   On: 12/25/2020 16:58   ECHOCARDIOGRAM COMPLETE  Result Date: 12/26/2020    ECHOCARDIOGRAM REPORT   Patient Name:   Dennis [name]. Date of Exam: 12/26/2020 Medical Rec #:  [id number]       Height:       72.0 in Accession #:    [id number]      Weight:       194.9 lb Date of Birth:  1961/10/09        BSA:          2.107 m Patient Age:    24 years        BP:           108/83 mmHg Patient Gender: M  HR:           102 bpm. Exam Location:  Inpatient Procedure: 2D Echo Indications:    Dyspnea R06.00  History:        Patient has prior history of Echocardiogram examinations, most                 recent 07/09/2020. CHF, CAD, Signs/Symptoms:Murmur; Risk                 Factors:Diabetes and Drug abuse.  Sonographer:    Mikki Santee RDCS (AE) Referring Phys: [id number] [place]  1. Left ventricular ejection fraction, by estimation, is 15-20%. The left ventricle has severely decreased function. The left ventricle demonstrates global hypokinesis. The left ventricular internal cavity size was moderately to severely dilated. Indeterminate diastolic filling due to E-A fusion.  2. Right ventricular systolic function is moderately reduced. The right ventricular size is moderately enlarged. There is normal pulmonary artery systolic pressure.  The estimated right ventricular systolic pressure is 14.7 mmHg.  3. Left atrial size was moderately dilated.  4. Right atrial size was moderately dilated.  5. The mitral valve is grossly normal. Severe mitral valve regurgitation. No evidence of mitral stenosis.  6. Tricuspid valve regurgitation is mild to moderate.  7. The aortic valve is tricuspid. Aortic valve regurgitation is trivial. No aortic stenosis is present.  8. The inferior vena cava is dilated in size with >50% respiratory variability, suggesting right atrial pressure of 8 mmHg. Comparison(s): Changes from prior study are noted. EF is now 15-20%. Severe MR is present. FINDINGS  Left Ventricle: Left ventricular ejection fraction, by estimation, is 15-20%. The left ventricle has severely decreased function. The left ventricle demonstrates global hypokinesis. The left ventricular internal cavity size was moderately to severely dilated. There is no left ventricular hypertrophy. Indeterminate diastolic filling due to E-A fusion. Right Ventricle: The right ventricular size is moderately enlarged. No increase in right ventricular wall thickness. Right ventricular systolic function is moderately reduced. There is normal pulmonary artery systolic pressure. The tricuspid regurgitant velocity is 2.17 m/s, and with an assumed right atrial pressure of 8 mmHg, the estimated right ventricular systolic pressure is 82.9 mmHg. Left Atrium: Left atrial size was moderately dilated. Right Atrium: Right atrial size was moderately dilated. Pericardium: Trivial pericardial effusion is present. Mitral Valve: The mitral valve is grossly normal. Severe mitral valve regurgitation. No evidence of mitral valve stenosis. Tricuspid Valve: The tricuspid valve is grossly normal. Tricuspid valve regurgitation is mild to moderate. No evidence of tricuspid stenosis. Aortic Valve: The aortic valve is tricuspid. Aortic valve regurgitation is trivial. No aortic stenosis is present. Pulmonic  Valve: The pulmonic valve was grossly normal. Pulmonic valve regurgitation is mild. No evidence of pulmonic stenosis. Aorta: The aortic root and ascending aorta are structurally normal, with no evidence of dilitation. Venous: The inferior vena cava is dilated in size with greater than 50% respiratory variability, suggesting right atrial pressure of 8 mmHg. IAS/Shunts: The atrial septum is grossly normal. Additional Comments: A device lead is visualized in the right atrium and right ventricle.  LEFT VENTRICLE PLAX 2D LVIDd:         6.50 cm      Diastology LVIDs:         5.60 cm      LV e' medial:  4.43 cm/s LV PW:         1.20 cm      LV e' lateral: 4.65 cm/s LV IVS:  0.90 cm LVOT diam:     2.20 cm LV SV:         29 LV SV Index:   14 LVOT Area:     3.80 cm  LV Volumes (MOD) LV vol d, MOD A2C: 196.0 ml LV vol d, MOD A4C: 160.0 ml LV vol s, MOD A2C: 163.0 ml LV vol s, MOD A4C: 129.0 ml LV SV MOD A2C:     33.0 ml LV SV MOD A4C:     160.0 ml LV SV MOD BP:      37.3 ml RIGHT VENTRICLE RV S prime:     6.98 cm/s TAPSE (M-mode): 1.4 cm LEFT ATRIUM              Index       RIGHT ATRIUM           Index LA diam:        4.40 cm  2.09 cm/m  RA Area:     25.60 cm LA Vol (A2C):   108.0 ml 51.25 ml/m RA Volume:   92.90 ml  44.09 ml/m LA Vol (A4C):   84.2 ml  39.96 ml/m LA Biplane Vol: 94.9 ml  45.04 ml/m  AORTIC VALVE LVOT Vmax:   58.10 cm/s LVOT Vmean:  38.600 cm/s LVOT VTI:    0.077 m  AORTA Ao Root diam: 3.50 cm MR Peak grad:    65.0 mmHg   TRICUSPID VALVE MR Mean grad:    41.0 mmHg   TR Peak grad:   18.8 mmHg MR Vmax:         403.00 cm/s TR Vmax:        217.00 cm/s MR Vmean:        302.0 cm/s MR PISA:         6.28 cm    SHUNTS MR PISA Eff ROA: 59 mm      Systemic VTI:  0.08 m MR PISA Radius:  1.00 cm     Systemic Diam: 2.20 cm Eleonore Chiquito MD Electronically signed by Eleonore Chiquito MD Signature Date/Time: 12/26/2020/1:41:55 PM    Final    CUP PACEART REMOTE DEVICE CHECK  Result Date: 12/06/2020 Alert for tachy  therapy disabled. Routing to triage for further evaluation. Known ERI status. HB [employer] note dated 11/14/20, tachy therapies disabled by industry via order from Dr. Lovena Le.  No plans for gen change Confirmed with industry rep, tachy therapies were disabled at request of Dr. Lovena Le, via device clinic RN as noted in [place]. Tommye Standard, PA-c  DG FEMUR MIN 2 VIEWS LEFT  Result Date: 12/26/2020 CLINICAL DATA:  Fracture follow-up EXAM: LEFT FEMUR 2 VIEWS COMPARISON:  11/13/2020 FINDINGS: Comminuted fracture distal femur unchanged. There is periosteal new bone formation around the fracture compatible with interval healing. ORIF with locking intramedullary rod extending into the distal femur. Rod in satisfactory position. No complications. IMPRESSION: Early healing of fracture distal femur. Intramedullary rod in good position. Electronically Signed   By: Franchot Gallo M.D.   On: 12/26/2020 13:33   VAS [place] LOWER EXTREMITY VENOUS (DVT)  Result Date: 12/19/2020  Lower Venous DVT Study Indications: LLE swelling.  Risk Factors: Cancer Lung CA. Comparison Study: Previous 05/15/2014 - negative Performing Technologist: Rogelia Rohrer  Examination Guidelines: A complete evaluation includes B-mode imaging, spectral Doppler, color Doppler, and power Doppler as needed of all accessible portions of each vessel. Bilateral testing is considered an integral part of a complete examination. Limited examinations for reoccurring indications may be performed as noted.  The reflux portion of the exam is performed with the patient in reverse Trendelenburg.  +---------+---------------+---------+-----------+----------+--------------+ RIGHT    CompressibilityPhasicitySpontaneityPropertiesThrombus Aging +---------+---------------+---------+-----------+----------+--------------+ CFV      Full           Yes      Yes                                 +---------+---------------+---------+-----------+----------+--------------+ SFJ      Full                                                         +---------+---------------+---------+-----------+----------+--------------+ FV Prox  Full           Yes      Yes                                 +---------+---------------+---------+-----------+----------+--------------+ FV Mid   Full           Yes      Yes                                 +---------+---------------+---------+-----------+----------+--------------+ FV DistalFull           Yes      Yes                                 +---------+---------------+---------+-----------+----------+--------------+ PFV      Full                                                        +---------+---------------+---------+-----------+----------+--------------+ POP      Full           Yes      Yes                                 +---------+---------------+---------+-----------+----------+--------------+ PTV      Full                                                        +---------+---------------+---------+-----------+----------+--------------+ PERO     Full                                                        +---------+---------------+---------+-----------+----------+--------------+   +---------+---------------+---------+-----------+----------+-----------------+ LEFT     CompressibilityPhasicitySpontaneityPropertiesThrombus Aging    +---------+---------------+---------+-----------+----------+-----------------+ CFV      Full           Yes      Yes                                    +---------+---------------+---------+-----------+----------+-----------------+  SFJ      Full                                                           +---------+---------------+---------+-----------+----------+-----------------+ FV Prox  Full           Yes      Yes                                    +---------+---------------+---------+-----------+----------+-----------------+ FV Mid   Full           Yes       Yes                                    +---------+---------------+---------+-----------+----------+-----------------+ FV DistalFull           Yes      Yes                                    +---------+---------------+---------+-----------+----------+-----------------+ PFV      Full                                                           +---------+---------------+---------+-----------+----------+-----------------+ POP      Partial        Yes      Yes                  Age Indeterminate +---------+---------------+---------+-----------+----------+-----------------+ PTV      Full                                                           +---------+---------------+---------+-----------+----------+-----------------+ PERO     Full                                                           +---------+---------------+---------+-----------+----------+-----------------+     Summary: BILATERAL: -No evidence of popliteal cyst, bilaterally. RIGHT: - There is no evidence of deep vein thrombosis in the lower extremity. - There is no evidence of superficial venous thrombosis.  LEFT: - Findings consistent with age indeterminate deep vein thrombosis involving the left popliteal vein. - There is no evidence of superficial venous thrombosis.  *See table(s) above for measurements and observations. Electronically signed by Harold Barban MD on 12/19/2020 at 10:07:24 PM.    Final     ASSESSMENT AND PLAN: This is a very pleasant 60 years old African-American male recently diagnosed with extensive stage small cell lung cancer presented with spiculated right middle lobe pulmonary nodule in addition to mediastinal lymphadenopathy and multiple metastatic  brain lesions.  The patient is status post whole brain irradiation. He is currently undergoing systemic chemotherapy with carboplatin, etoposide and Imfinzi status post 8 cycles.  Starting from cycle #5 the patient is on maintenance treatment with  immunotherapy with Imfinzi every 4 weeks. The patient has been off treatment for the last few months secondary to frequent hospitalization with COVID-19 as well as fracture of the left femur.  He is recovering well from his hospitalization and the surgery for the left femur. The patient has been off treatment for the last several months. He had repeat CT scan of the chest, abdomen pelvis performed recently.  His scan showed interval enlargement of a spiculated nodule of the posterior lateral segment of the right middle lobe as well as newly enlarged pretracheal and subcarinal lymph nodes consistent with nodal metastatic disease. I had a lengthy discussion with the patient and his brother today about his current disease status and treatment options. Unfortunately the patient has evidence for disease progression.  I discussed with him the option of palliative care and hospice referral versus consideration of palliative systemic chemotherapy with repeating the course of carboplatin for AUC of 5 on day 1 and etoposide 100 mg/M2 on days 1, 2 and 3 as well as Tecentriq 1200 mg IV every 3 weeks with Cosela for Myeloprotection. I discussed with the patient the adverse effect of this treatment and his current prognosis with and without treatment and also the risk of proceeding with chemotherapy with his general condition including the risk of infection and even death. The patient is interested in proceeding with the treatment but he would like to wait for 2 weeks until his condition is better. He is expected to start the first cycle of this treatment on January 15, 2021.  I will see him back for follow-up visit that day for evaluation before resuming the treatment. For the congestive heart failure he will continue his current treatment as prescribed by Dr. Haroldine Laws. For the deep venous thrombosis he will continue his current treatment with Xarelto. The patient was advised to call immediately if he has any other  concerning symptoms in the interval. The patient voices understanding of current disease status and treatment options and is in agreement with the current care plan.  All questions were answered. The patient knows to call the clinic with any problems, questions or concerns. We can certainly see the patient much sooner if necessary.  Disclaimer: This note was dictated with voice recognition software. Similar sounding words can inadvertently be transcribed and may not be corrected upon review.

## 2021-01-08 ENCOUNTER — Telehealth: Payer: Self-pay | Admitting: Internal Medicine

## 2021-01-08 NOTE — Telephone Encounter (Signed)
Scheduled per 03/23 los, patient has been called but voicemail was full. I was not able to leave a voicemail, I will attempt to call patient again.

## 2021-01-09 ENCOUNTER — Other Ambulatory Visit: Payer: Self-pay | Admitting: *Deleted

## 2021-01-09 DIAGNOSIS — C7931 Secondary malignant neoplasm of brain: Secondary | ICD-10-CM

## 2021-01-09 NOTE — Progress Notes (Signed)
Pharmacist Chemotherapy Monitoring - Initial Assessment    Anticipated start date: 01/15/21   Regimen:  . Are orders appropriate based on the patient's diagnosis, regimen, and cycle? Yes . Does the plan date match the patient's scheduled date? Yes . Is the sequencing of drugs appropriate? Yes . Are the premedications appropriate for the patient's regimen? Yes . Prior Authorization for treatment is: Pending o If applicable, is the correct biosimilar selected based on the patient's insurance? not applicable  Organ Function and Labs: Marland Kitchen Are dose adjustments needed based on the patient's renal function, hepatic function, or hematologic function? No . Are appropriate labs ordered prior to the start of patient's treatment? Yes . Other organ system assessment, if indicated: N/A . The following baseline labs, if indicated, have been ordered: atezolizumab: baseline TSH +/- T4  Dose Assessment: . Are the drug doses appropriate? Yes . Are the following correct: o Drug concentrations Yes o IV fluid compatible with drug Yes o Administration routes Yes o Timing of therapy Yes . If applicable, does the patient have documented access for treatment and/or plans for port-a-cath placement? yes . If applicable, have lifetime cumulative doses been properly documented and assessed? yes Lifetime Dose Tracking  . Carboplatin: 1,540 mg = 0.01 % of the maximum lifetime dose of 999,999,999 mg  o   Toxicity Monitoring/Prevention: . The patient has the following take home antiemetics prescribed: Prochlorperazine . The patient has the following take home medications prescribed: N/A . Medication allergies and previous infusion related reactions, if applicable, have been reviewed and addressed. Yes . The patient's current medication list has been assessed for drug-drug interactions with their chemotherapy regimen. no significant drug-drug interactions were identified on review.  Order Review: . Are the treatment  plan orders signed? Yes . Is the patient scheduled to see a provider prior to their treatment? No  I verify that I have reviewed each item in the above checklist and answered each question accordingly.   Kennith Center, Pharm.D., CPP 01/09/2021@12 :54 PM

## 2021-01-15 ENCOUNTER — Inpatient Hospital Stay: Payer: Medicare Other

## 2021-01-15 ENCOUNTER — Telehealth: Payer: Self-pay | Admitting: Medical Oncology

## 2021-01-15 ENCOUNTER — Inpatient Hospital Stay: Payer: Medicare Other | Admitting: Nutrition

## 2021-01-15 NOTE — Telephone Encounter (Signed)
Schedule message sent to r/s day 1-3 . Pt brother notified of appt tomorrow and confirmed .

## 2021-01-15 NOTE — Progress Notes (Signed)
[facility] OFFICE PROGRESS NOTE  [name], MD Dennis [name] [id number]  DIAGNOSIS: Extensive stage small cell lung cancer. He presented with a spiculated right middle lobe pulmonary nodule and mildly enlargedsolitary aortopulmonary lymph node. 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 He was diagnosed in March 2021.  PRIOR THERAPY: 1) Whole brain radiation under the care of Dr. Lisbeth Renshaw. Completed 01/17/2020 2) Systemic chemotherapy withcarboplatin for an AUC of 5 on day 1,etoposide 100 mg/m on days 1, 2, and 3, and Imfinzi 1500 mg on day 1 IV every 3 weeks. First dose expected on4/20/21. Status post 7  cycles.  Starting from cycle #5 the patient will be treated with maintenance Imfinzi 1500 mg IV every 4 weeks.  He has been off treatment for the last 4 months.  CURRENT THERAPY: Resuming systemic chemotherapy with carboplatin for AUC of 5 on day 1, etoposide 100 mg/M2 on days 1, 2 and 3 with Cosela 240 mg/M2 before chemotherapy as well as Tecentriq 1200 mg IV every 3 weeks.  First dose January 16, 2021  INTERVAL HISTORY: Dennis [name]. 60 y.o. male returns to the clinic today for a follow-up visit accompanied by his brother.  The patient was last seen in clinic on 01/02/2021.  The patient had been off treatment for several months due to having COVID-19 and being in the hospital for femoral fracture.  Additionally, the patient has been having some hospital admissions for worsening congestive heart failure and receiving aggressive diuresis.  He is currently on 3 L of home oxygen.  He follows with Dr. Haroldine Laws from cardiology for his CHF.  Of note, the patient is also on Xarelto for DVT. He also never received his potassium supplements from the mail order pharmacy for his lasix.   Since the patient has been off treatment, the most recent restaging scan showed evidence of disease progression.  At Dr.  Worthy Flank last appointment with the patient on 01/02/2021, Dr. Julien Nordmann discussed the options with the patient which include palliative care/hospice versus resuming chemotherapy.  Dr. Julien Nordmann discussed his current condition and possible adverse side effects. The patient still opted to resume treatment with chemotherapy for which he is expected to start today.   Since his last appointment, the patient denies any fever, chills, or night sweats.  His weight is relatively stable. He is scheduled to meet with the nutritionist team while in the infusion room today.  He denies any chest pain or hemoptysis. He has his baseline cough. He reports he continues to have labored breathing.  He denies any nausea, vomiting, or diarrhea. He reports constipation for which he takes sennakot.  He denies any headache or visual changes. The patient is here today for evaluation and repeat lab work performed possibly resuming his treatment.  MEDICAL HISTORY: Past Medical History:  Diagnosis Date  . Alcohol abuse with alcohol-induced mood disorder ([place]) 08/18/2015  . Automatic implantable cardioverter-defibrillator in situ 2012   S/P St Jude Dual Chamber. ICD.  Marland Kitchen CAD (coronary artery disease) 11/16/2014   CAD Coronary angiography (12/2008) with mid LAD totally occluded and collateralized.  . Cardiac LV ejection fraction 10-20%   . Chest pain 09/04/2018  . CHF (congestive heart failure) ([place])   . Chronic systolic heart failure (HCC)    a) Mixed ICM/NICM b) RHC (05/2014): RA 2, RV 19/2/3, PA 22/14 (18), PCWP 6, Fick CO/CI: 5.2 / 2.7, PVR 2.3 WU, PA 60%  and 64% c) ECHO (05/2014): EF 20-25%, diff HK, akinesis entireanteroseptal myocardium, triv AI, mod MR, LA mod/sev dilated  . Cocaine abuse ([place]) 01/24/2015  . Cocaine abuse with cocaine-induced mood disorder ([place]) 08/20/2015  . Drug abuse and dependence ([place])   . Dysphagia following cerebral infarction 01/24/2015  . Embolic stroke involving right middle cerebral artery ([place])   .  Extensive stage primary small cell carcinoma of lung ([place]) 01/04/2020  . H/O noncompliance with medical treatment, presenting hazards to health 01/24/2015  . Heart murmur   . High cholesterol   . Ischemic cardiomyopathy    a) Coronary angiography (12/2008) at [facility]: Lmain: nl, LAD mid 100% stenosis with left to left and right-to-left collaterals to the distal LAD; Lcx: nl, RCA nl.    . 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[place])   . MDD (major depressive disorder), recurrent severe, without psychosis ([place]) 08/18/2015  . Open wound of foot 02/27/2015  . Pneumonia 1988  . Psychoactive substance-induced mood disorder ([place]) 07/17/2015  . Sleep apnea    "cleared after T&A"  . Status post PICC central line placement   . Stroke ([place])   . Substance induced mood disorder ([place]) 08/17/2015  . Type II diabetes mellitus (HCC)     ALLERGIES:  has No Known Allergies.  MEDICATIONS:  Current Outpatient Medications  Medication Sig Dispense Refill  . Accu-Chek Softclix Lancets lancets     . atorvastatin (LIPITOR) 20 MG tablet TAKE 1 TABLET BY MOUTH DAILY AT 6 PM. 30 tablet 6  . Blood Glucose Monitoring Suppl (FIFTY50 GLUCOSE METER 2.0) w/Device KIT Use as instructed    . budesonide-formoterol (SYMBICORT) 160-4.5 MCG/ACT inhaler Inhale 2 puffs into the lungs 2 (two) times daily. 1 each 12  . dexamethasone (DECADRON) 1 MG tablet Take 1 tablet (1 mg total) by mouth daily. 60 tablet 2  . feeding supplement (ENSURE ENLIVE / ENSURE PLUS) LIQD Take 237 mLs by mouth 2 (two) times daily between meals. 237 mL 0  . furosemide (LASIX) 40 MG tablet Take 1 tablet (40 mg total) by mouth daily. 90 tablet 0  . insulin glargine (LANTUS SOLOSTAR) 100 UNIT/ML Solostar Pen Inject 25 Units into the skin 2 (two) times daily. (Patient taking differently: Inject 25 Units into the skin in the morning and at bedtime.) 15 mL 2  . insulin lispro (HUMALOG KWIKPEN) 100 UNIT/ML KwikPen Inject 6 Units into the skin 3 (three) times daily. (Patient  taking differently: Inject 6 Units into the skin 2 (two) times daily.) 15 mL 1  . INSUPEN ULTRAFIN 31G X 8 MM MISC     . methocarbamol (ROBAXIN) 500 MG tablet Take 1 tablet (500 mg total) by mouth every 6 (six) hours as needed for muscle spasms.    . midodrine (PROAMATINE) 10 MG tablet Take 1 tablet (10 mg total) by mouth 3 (three) times daily with meals. (Patient taking differently: Take 10 mg by mouth daily.) 90 tablet 2  . potassium chloride (KLOR-CON) 10 MEQ tablet Take 1 tablet (10 mEq total) by mouth daily. 90 each 0  . prochlorperazine (COMPAZINE) 10 MG tablet Take 10 mg by mouth every 6 (six) hours as needed for nausea or vomiting.    Marland Kitchen RIVAROXABAN (XARELTO) VTE STARTER PACK (15 & 20 MG) Follow package directions: Take one 43m tablet by mouth twice a day. On day 22, switch to one 235mtablet once a day. Take with food. 51 each 0  . senna (SENOKOT) 8.6 MG TABS tablet Take 1 tablet (8.6 mg total)  by mouth 2 (two) times daily. (Patient taking differently: Take 1 tablet by mouth every other day.) 120 tablet 0  . tamsulosin (FLOMAX) 0.4 MG CAPS capsule Take 0.4 mg by mouth daily.    Marland Kitchen tiotropium (SPIRIVA HANDIHALER) 18 MCG inhalation capsule Place 1 capsule (18 mcg total) into inhaler and inhale daily. 90 capsule 3  . Vitamin D3 (VITAMIN D) 25 MCG tablet Take 2 tablets (2,000 Units total) by mouth 2 (two) times daily. 120 tablet 0   No current facility-administered medications for this visit.    SURGICAL HISTORY:  Past Surgical History:  Procedure Laterality Date  . BRONCHIAL BRUSHINGS  12/28/2019   Procedure: BRONCHIAL BRUSHINGS;  Surgeon: Josephine Igo, DO;  Location: [facility];  Service: Thoracic;;  . BRONCHIAL NEEDLE ASPIRATION BIOPSY  12/28/2019   Procedure: BRONCHIAL NEEDLE ASPIRATION BIOPSIES;  Surgeon: Josephine Igo, DO;  Location: [facility];  Service: Thoracic;;  . BRONCHIAL WASHINGS  12/28/2019   Procedure: BRONCHIAL WASHINGS;  Surgeon: Josephine Igo, DO;  Location:  [facility];  Service: Thoracic;;  . CARDIAC CATHETERIZATION  05/2014  . CARDIAC DEFIBRILLATOR PLACEMENT  03/2011  . CHOLECYSTECTOMY  1994  . FEMUR IM NAIL Left 10/31/2020   Procedure: INTRAMEDULLARY (IM) RETROGRADE FEMORAL NAILING;  Surgeon: Roby Lofts, MD;  Location: [facility] OR;  Service: Orthopedics;  Laterality: Left;  . FOREARM FRACTURE SURGERY Left 1980  . FRACTURE SURGERY    . IR IMAGING GUIDED PORT INSERTION  10/11/2020  . RIGHT HEART CATHETERIZATION N/A 05/30/2014   Procedure: RIGHT HEART CATH;  Surgeon: Dolores Patty, MD;  Location: [facility] CATH LAB;  Service: Cardiovascular;  Laterality: N/A;  . RIGHT HEART CATHETERIZATION N/A 02/07/2015   Procedure: RIGHT HEART CATH;  Surgeon: Dolores Patty, MD;  Location: [facility] CATH LAB;  Service: Cardiovascular;  Laterality: N/A;  . TONSILLECTOMY AND ADENOIDECTOMY  ~ 1998  . VIDEO BRONCHOSCOPY WITH ENDOBRONCHIAL ULTRASOUND N/A 12/28/2019   Procedure: VIDEO BRONCHOSCOPY WITH ENDOBRONCHIAL ULTRASOUND;  Surgeon: Josephine Igo, DO;  Location: [facility]OS[facility];  Service: Thoracic;  Laterality: N/A;    REVIEW OF SYSTEMS:   Review of Systems  Constitutional: Positive for fatigue and decreased appetite. Negative for chills, fever, and unexpected weight change.  HENT: Negative for mouth sores, nosebleeds, sore throat and trouble swallowing.   Eyes: Negative for eye problems and icterus.  Respiratory: Positive for cough and shortness of breath. Negative for hemoptysis and wheezing.   Cardiovascular: Positive for leg swelling. Negative for chest pain. Gastrointestinal: Positive for constipation. Negative for abdominal pain, diarrhea, nausea and vomiting.  Genitourinary: Negative for bladder incontinence, difficulty urinating, dysuria, frequency and hematuria.   Musculoskeletal: Negative for back pain, gait problem, neck pain and neck stiffness.  Skin: Negative for itching and rash.  Neurological: Negative for dizziness, extremity weakness, gait problem,  headaches, light-headedness and seizures.  Hematological: Negative for adenopathy. Does not bruise/bleed easily.  Psychiatric/Behavioral: Negative for confusion, depression and sleep disturbance. The patient is not nervous/anxious.     PHYSICAL EXAMINATION:  There were no vitals taken for this visit.  ECOG PERFORMANCE STATUS: 2 - Symptomatic, <50% confined to bed  Physical Exam  Constitutional: Oriented to person, place, and time and chronically ill appearing male and in no distress.  HENT:  Head: Normocephalic and atraumatic.  Mouth/Throat: Oropharynx is clear and moist. No oropharyngeal exudate.  Eyes: Conjunctivae are normal. Right eye exhibits no discharge. Left eye exhibits no discharge. No scleral icterus.  Neck: Normal range of motion. Neck supple.  Cardiovascular:  Normal rate, regular rhythm, normal heart sounds and intact distal pulses.   Pulmonary/Chest: Effort normal and breath sounds normal. No respiratory distress. No wheezes. No rales. On oxygen via nasal cannula.  Abdominal: Soft. Bowel sounds are normal. Exhibits no distension and no mass. There is no tenderness.  Musculoskeletal: Positive for bilateral leg swelling. Normal range of motion.  Lymphadenopathy:    No cervical adenopathy.  Neurological: Alert and oriented to person, place, and time. Exhibits muscle wasting. Examined in the wheelchair.  Skin: Skin is warm and dry. No rash noted. Not diaphoretic. No erythema. No pallor.  Psychiatric: Mood, memory and judgment normal.  Vitals reviewed.  LABORATORY DATA: Lab Results  Component Value Date   WBC 10.6 (H) 01/02/2021   HGB 11.8 (L) 01/02/2021   HCT 39.1 01/02/2021   MCV 98.5 01/02/2021   PLT 223 01/02/2021      Chemistry      Component Value Date/Time   NA 147 (H) 01/02/2021 1144   K 3.3 (L) 01/02/2021 1144   CL 108 01/02/2021 1144   CO2 28 01/02/2021 1144   BUN 29 (H) 01/02/2021 1144   CREATININE 1.50 (H) 01/02/2021 1144      Component Value  Date/Time   CALCIUM 9.2 01/02/2021 1144   ALKPHOS 147 (H) 01/02/2021 1144   AST 21 01/02/2021 1144   ALT 41 01/02/2021 1144   BILITOT 0.6 01/02/2021 1144       RADIOGRAPHIC STUDIES:  CT Chest W Contrast  Result Date: 12/25/2020 CLINICAL DATA:  Metastatic small-cell lung cancer restaging, known brain metastatic disease EXAM: CT CHEST, ABDOMEN, AND PELVIS WITH CONTRAST TECHNIQUE: Multidetector CT imaging of the chest, abdomen and pelvis was performed following the standard protocol during bolus administration of intravenous contrast. CONTRAST:  130mL OMNIPAQUE IOHEXOL 300 MG/ML SOLN, additional oral enteric contrast COMPARISON:  09/17/2020 FINDINGS: CT CHEST FINDINGS Cardiovascular: Right chest port catheter. Aortic atherosclerosis. Cardiomegaly. Three-vessel coronary artery calcifications. No pericardial effusion. Mediastinum/Nodes: Newly enlarged pretracheal and subcarinal lymph nodes, largest pretracheal nodes measuring 2.4 x 1.7 cm (series 2, image 22). Thyroid gland, trachea, and esophagus demonstrate no significant findings. Lungs/Pleura: New small bilateral pleural effusions with associated atelectasis or consolidation. Moderate centrilobular emphysema. Interval enlargement of a spiculated nodule of the posterolateral segment of the right middle lobe, abutting the major fissure, measuring 1.6 x 1.0 cm, previously 1.1 x 0.7 cm when measured similarly (series 4, image 90). Is diffuse bilateral interlobular septal thickening. Diffuse bilateral bronchial wall thickening. Musculoskeletal: No chest wall mass or suspicious bone lesions identified. CT ABDOMEN PELVIS FINDINGS Hepatobiliary: No focal liver abnormality is seen. Status post cholecystectomy. No biliary dilatation. Pancreas: Unremarkable. No pancreatic ductal dilatation or surrounding inflammatory changes. Spleen: Normal in size without significant abnormality. Adrenals/Urinary Tract: Stable low-attenuation nodule of the right adrenal gland  measuring 2.7 cm, previously non FDG avid and characterized as a benign adenoma (series 2, image 60). Punctuate nonobstructive calculus of the inferior pole of the right kidney. No hydronephrosis bladder is unremarkable. Stomach/Bowel: Stomach is within normal limits. Appendix appears normal. No evidence of bowel wall thickening, distention, or inflammatory changes. Vascular/Lymphatic: Aortic atherosclerosis. No enlarged abdominal or pelvic lymph nodes. Reproductive: No mass or other abnormality. Other: No abdominal wall hernia or abnormality. No abdominopelvic ascites. Musculoskeletal: No acute or significant osseous findings. IMPRESSION: 1. Interval enlargement of a spiculated nodule of the posterolateral segment of the right middle lobe, consistent with worsened primary lung malignancy. 2. Newly enlarged pretracheal and subcarinal lymph nodes, consistent with nodal metastatic disease.  3. New small bilateral pleural effusions with associated atelectasis or consolidation. There is diffuse bilateral interlobular septal thickening. These findings suggest pulmonary edema but lymphangitic metastatic disease can produce this appearance. No obvious pleural nodularity identified. 4. No evidence of metastatic disease within the abdomen or pelvis. 5. Emphysema. 6. Coronary artery disease. Aortic Atherosclerosis (ICD10-I70.0) and Emphysema (ICD10-J43.9). Electronically Signed   By: Eddie Candle M.D.   On: 12/25/2020 10:05   CT Abdomen Pelvis W Contrast  Result Date: 12/25/2020 CLINICAL DATA:  Metastatic small-cell lung cancer restaging, known brain metastatic disease EXAM: CT CHEST, ABDOMEN, AND PELVIS WITH CONTRAST TECHNIQUE: Multidetector CT imaging of the chest, abdomen and pelvis was performed following the standard protocol during bolus administration of intravenous contrast. CONTRAST:  116m OMNIPAQUE IOHEXOL 300 MG/ML SOLN, additional oral enteric contrast COMPARISON:  09/17/2020 FINDINGS: CT CHEST FINDINGS  Cardiovascular: Right chest port catheter. Aortic atherosclerosis. Cardiomegaly. Three-vessel coronary artery calcifications. No pericardial effusion. Mediastinum/Nodes: Newly enlarged pretracheal and subcarinal lymph nodes, largest pretracheal nodes measuring 2.4 x 1.7 cm (series 2, image 22). Thyroid gland, trachea, and esophagus demonstrate no significant findings. Lungs/Pleura: New small bilateral pleural effusions with associated atelectasis or consolidation. Moderate centrilobular emphysema. Interval enlargement of a spiculated nodule of the posterolateral segment of the right middle lobe, abutting the major fissure, measuring 1.6 x 1.0 cm, previously 1.1 x 0.7 cm when measured similarly (series 4, image 90). Is diffuse bilateral interlobular septal thickening. Diffuse bilateral bronchial wall thickening. Musculoskeletal: No chest wall mass or suspicious bone lesions identified. CT ABDOMEN PELVIS FINDINGS Hepatobiliary: No focal liver abnormality is seen. Status post cholecystectomy. No biliary dilatation. Pancreas: Unremarkable. No pancreatic ductal dilatation or surrounding inflammatory changes. Spleen: Normal in size without significant abnormality. Adrenals/Urinary Tract: Stable low-attenuation nodule of the right adrenal gland measuring 2.7 cm, previously non FDG avid and characterized as a benign adenoma (series 2, image 60). Punctuate nonobstructive calculus of the inferior pole of the right kidney. No hydronephrosis bladder is unremarkable. Stomach/Bowel: Stomach is within normal limits. Appendix appears normal. No evidence of bowel wall thickening, distention, or inflammatory changes. Vascular/Lymphatic: Aortic atherosclerosis. No enlarged abdominal or pelvic lymph nodes. Reproductive: No mass or other abnormality. Other: No abdominal wall hernia or abnormality. No abdominopelvic ascites. Musculoskeletal: No acute or significant osseous findings. IMPRESSION: 1. Interval enlargement of a spiculated  nodule of the posterolateral segment of the right middle lobe, consistent with worsened primary lung malignancy. 2. Newly enlarged pretracheal and subcarinal lymph nodes, consistent with nodal metastatic disease. 3. New small bilateral pleural effusions with associated atelectasis or consolidation. There is diffuse bilateral interlobular septal thickening. These findings suggest pulmonary edema but lymphangitic metastatic disease can produce this appearance. No obvious pleural nodularity identified. 4. No evidence of metastatic disease within the abdomen or pelvis. 5. Emphysema. 6. Coronary artery disease. Aortic Atherosclerosis (ICD10-I70.0) and Emphysema (ICD10-J43.9). Electronically Signed   By: AEddie CandleM.D.   On: 12/25/2020 10:05   DG CHEST PORT 1 VIEW  Result Date: 12/28/2020 CLINICAL DATA:  Shortness of breath EXAM: PORTABLE CHEST 1 VIEW COMPARISON:  12/25/2020 FINDINGS: Right chest wall port catheter is unchanged. Persistent patchy bilateral opacities. No significant pleural effusion. No pneumothorax. Stable cardiomediastinal contours. Left chest wall ICD. IMPRESSION: Similar patchy bilateral pulmonary opacities and interstitial prominence likely reflecting edema. Right middle lobe nodule chest CT is not. Electronically Signed   By: PMacy MisM.D.   On: 12/28/2020 08:14   DG Chest Port 1 View  Result Date: 12/25/2020 CLINICAL DATA:  Shortness of  breath. EXAM: PORTABLE CHEST 1 VIEW COMPARISON:  CT chest 12/24/2020. FINDINGS: Right chest wall port a catheter tip is at the cavoatrial junction. There is a left chest wall pacer device with leads in the right atrial appendage and right ventricle. Aortic atherosclerosis. Diffusely increased interstitial markings identified compatible with pulmonary edema. Small pleural effusions with veil like opacification of the lower lobes identified. Left lung base airspace disease. Chronic interstitial changes of COPD/emphysema. IMPRESSION: 1. Persistent  diffuse increase interstitial markings compatible with interstitial edema. Persistent pleural effusions with veil like opacification of the lower lung zones. 2. Asymmetric airspace opacification within the left lower lobe which may reflect pneumonia and or atelectasis. Electronically Signed   By: Signa Kell M.D.   On: 12/25/2020 16:58   ECHOCARDIOGRAM COMPLETE  Result Date: 12/26/2020    ECHOCARDIOGRAM REPORT   Patient Name:   Dennis [name]. Date of Exam: 12/26/2020 Medical Rec #:  [id number]       Height:       72.0 in Accession #:    [id number]      Weight:       194.9 lb Date of Birth:  December 10, 1960        BSA:          2.107 m Patient Age:    59 years        BP:           108/83 mmHg Patient Gender: M               HR:           102 bpm. Exam Location:  Inpatient Procedure: 2D Echo Indications:    Dyspnea R06.00  History:        Patient has prior history of Echocardiogram examinations, most                 recent 07/09/2020. CHF, CAD, Signs/Symptoms:Murmur; Risk                 Factors:Diabetes and Drug abuse.  Sonographer:    Thurman Coyer RDCS (AE) Referring Phys: [id number] Cecille Po MELVIN IMPRESSIONS  1. Left ventricular ejection fraction, by estimation, is 15-20%. The left ventricle has severely decreased function. The left ventricle demonstrates global hypokinesis. The left ventricular internal cavity size was moderately to severely dilated. Indeterminate diastolic filling due to E-A fusion.  2. Right ventricular systolic function is moderately reduced. The right ventricular size is moderately enlarged. There is normal pulmonary artery systolic pressure. The estimated right ventricular systolic pressure is 26.8 mmHg.  3. Left atrial size was moderately dilated.  4. Right atrial size was moderately dilated.  5. The mitral valve is grossly normal. Severe mitral valve regurgitation. No evidence of mitral stenosis.  6. Tricuspid valve regurgitation is mild to moderate.  7. The aortic valve is tricuspid.  Aortic valve regurgitation is trivial. No aortic stenosis is present.  8. The inferior vena cava is dilated in size with >50% respiratory variability, suggesting right atrial pressure of 8 mmHg. Comparison(s): Changes from prior study are noted. EF is now 15-20%. Severe MR is present. FINDINGS  Left Ventricle: Left ventricular ejection fraction, by estimation, is 15-20%. The left ventricle has severely decreased function. The left ventricle demonstrates global hypokinesis. The left ventricular internal cavity size was moderately to severely dilated. There is no left ventricular hypertrophy. Indeterminate diastolic filling due to E-A fusion. Right Ventricle: The right ventricular size is moderately enlarged. No increase in right ventricular wall thickness. Right ventricular  systolic function is moderately reduced. There is normal pulmonary artery systolic pressure. The tricuspid regurgitant velocity is 2.17 m/s, and with an assumed right atrial pressure of 8 mmHg, the estimated right ventricular systolic pressure is 15.4 mmHg. Left Atrium: Left atrial size was moderately dilated. Right Atrium: Right atrial size was moderately dilated. Pericardium: Trivial pericardial effusion is present. Mitral Valve: The mitral valve is grossly normal. Severe mitral valve regurgitation. No evidence of mitral valve stenosis. Tricuspid Valve: The tricuspid valve is grossly normal. Tricuspid valve regurgitation is mild to moderate. No evidence of tricuspid stenosis. Aortic Valve: The aortic valve is tricuspid. Aortic valve regurgitation is trivial. No aortic stenosis is present. Pulmonic Valve: The pulmonic valve was grossly normal. Pulmonic valve regurgitation is mild. No evidence of pulmonic stenosis. Aorta: The aortic root and ascending aorta are structurally normal, with no evidence of dilitation. Venous: The inferior vena cava is dilated in size with greater than 50% respiratory variability, suggesting right atrial pressure of 8  mmHg. IAS/Shunts: The atrial septum is grossly normal. Additional Comments: A device lead is visualized in the right atrium and right ventricle.  LEFT VENTRICLE PLAX 2D LVIDd:         6.50 cm      Diastology LVIDs:         5.60 cm      LV e' medial:  4.43 cm/s LV PW:         1.20 cm      LV e' lateral: 4.65 cm/s LV IVS:        0.90 cm LVOT diam:     2.20 cm LV SV:         29 LV SV Index:   14 LVOT Area:     3.80 cm  LV Volumes (MOD) LV vol d, MOD A2C: 196.0 ml LV vol d, MOD A4C: 160.0 ml LV vol s, MOD A2C: 163.0 ml LV vol s, MOD A4C: 129.0 ml LV SV MOD A2C:     33.0 ml LV SV MOD A4C:     160.0 ml LV SV MOD BP:      37.3 ml RIGHT VENTRICLE RV S prime:     6.98 cm/s TAPSE (M-mode): 1.4 cm LEFT ATRIUM              Index       RIGHT ATRIUM           Index LA diam:        4.40 cm  2.09 cm/m  RA Area:     25.60 cm LA Vol (A2C):   108.0 ml 51.25 ml/m RA Volume:   92.90 ml  44.09 ml/m LA Vol (A4C):   84.2 ml  39.96 ml/m LA Biplane Vol: 94.9 ml  45.04 ml/m  AORTIC VALVE LVOT Vmax:   58.10 cm/s LVOT Vmean:  38.600 cm/s LVOT VTI:    0.077 m  AORTA Ao Root diam: 3.50 cm MR Peak grad:    65.0 mmHg   TRICUSPID VALVE MR Mean grad:    41.0 mmHg   TR Peak grad:   18.8 mmHg MR Vmax:         403.00 cm/s TR Vmax:        217.00 cm/s MR Vmean:        302.0 cm/s MR PISA:         6.28 cm    SHUNTS MR PISA Eff ROA: 59 mm      Systemic VTI:  0.08 m MR PISA Radius:  1.00 cm     Systemic Diam: 2.20 cm Eleonore Chiquito MD Electronically signed by Eleonore Chiquito MD Signature Date/Time: 12/26/2020/1:41:55 PM    Final    DG FEMUR MIN 2 VIEWS LEFT  Result Date: 12/26/2020 CLINICAL DATA:  Fracture follow-up EXAM: LEFT FEMUR 2 VIEWS COMPARISON:  11/13/2020 FINDINGS: Comminuted fracture distal femur unchanged. There is periosteal new bone formation around the fracture compatible with interval healing. ORIF with locking intramedullary rod extending into the distal femur. Rod in satisfactory position. No complications. IMPRESSION: Early healing  of fracture distal femur. Intramedullary rod in good position. Electronically Signed   By: Franchot Gallo M.D.   On: 12/26/2020 13:33   VAS [place] LOWER EXTREMITY VENOUS (DVT)  Result Date: 12/19/2020  Lower Venous DVT Study Indications: LLE swelling.  Risk Factors: Cancer Lung CA. Comparison Study: Previous 05/15/2014 - negative Performing Technologist: Rogelia Rohrer  Examination Guidelines: A complete evaluation includes B-mode imaging, spectral Doppler, color Doppler, and power Doppler as needed of all accessible portions of each vessel. Bilateral testing is considered an integral part of a complete examination. Limited examinations for reoccurring indications may be performed as noted. The reflux portion of the exam is performed with the patient in reverse Trendelenburg.  +---------+---------------+---------+-----------+----------+--------------+ RIGHT    CompressibilityPhasicitySpontaneityPropertiesThrombus Aging +---------+---------------+---------+-----------+----------+--------------+ CFV      Full           Yes      Yes                                 +---------+---------------+---------+-----------+----------+--------------+ SFJ      Full                                                        +---------+---------------+---------+-----------+----------+--------------+ FV Prox  Full           Yes      Yes                                 +---------+---------------+---------+-----------+----------+--------------+ FV Mid   Full           Yes      Yes                                 +---------+---------------+---------+-----------+----------+--------------+ FV DistalFull           Yes      Yes                                 +---------+---------------+---------+-----------+----------+--------------+ PFV      Full                                                        +---------+---------------+---------+-----------+----------+--------------+ POP      Full            Yes      Yes                                 +---------+---------------+---------+-----------+----------+--------------+  PTV      Full                                                        +---------+---------------+---------+-----------+----------+--------------+ PERO     Full                                                        +---------+---------------+---------+-----------+----------+--------------+   +---------+---------------+---------+-----------+----------+-----------------+ LEFT     CompressibilityPhasicitySpontaneityPropertiesThrombus Aging    +---------+---------------+---------+-----------+----------+-----------------+ CFV      Full           Yes      Yes                                    +---------+---------------+---------+-----------+----------+-----------------+ SFJ      Full                                                           +---------+---------------+---------+-----------+----------+-----------------+ FV Prox  Full           Yes      Yes                                    +---------+---------------+---------+-----------+----------+-----------------+ FV Mid   Full           Yes      Yes                                    +---------+---------------+---------+-----------+----------+-----------------+ FV DistalFull           Yes      Yes                                    +---------+---------------+---------+-----------+----------+-----------------+ PFV      Full                                                           +---------+---------------+---------+-----------+----------+-----------------+ POP      Partial        Yes      Yes                  Age Indeterminate +---------+---------------+---------+-----------+----------+-----------------+ PTV      Full                                                           +---------+---------------+---------+-----------+----------+-----------------+  PERO     Full                                                            +---------+---------------+---------+-----------+----------+-----------------+     Summary: BILATERAL: -No evidence of popliteal cyst, bilaterally. RIGHT: - There is no evidence of deep vein thrombosis in the lower extremity. - There is no evidence of superficial venous thrombosis.  LEFT: - Findings consistent with age indeterminate deep vein thrombosis involving the left popliteal vein. - There is no evidence of superficial venous thrombosis.  *See table(s) above for measurements and observations. Electronically signed by Harold Barban MD on 12/19/2020 at 10:07:24 PM.    Final      ASSESSMENT/PLAN:  This is a very pleasant 59 year old African-American male diagnosed with extensive stage small cell lung cancer. He presented with spiculated right middle lobe pulmonary nodule in addition to mediastinal lymphadenopathy and multiple metastatic brain lesions. The patient is status post whole brain irradiation.  The patient underwent systemic chemotherapy with carboplatin, etoposide, and Imfinzi.  He was status post 8 cycles of treatment.  Starting from cycle #5 the patient was on maintenance treatment with immunotherapy with Imfinzi every 4 weeks.  The patient had several prolonged hospitalizations over the last few months due to COVID-19 as well as fracture of the left femur.  The patient was also hospitalized due to congestive heart failure.  Since being off treatment for several months, the patient's restaging scan showed evidence of disease progression with an interval enlargement of the spiculated nodule in the posterior lateral segment of the right middle lobe as well as new enlarged pretracheal and subcarinal lymphadenopathy consistent with nodal metastatic disease.  The patient was previously seen by Dr. Julien Nordmann who had a lengthy discussion with the patient about his current condition and overall prognosis.  The patient was offered  referral to palliative care/hospice versus resuming treatment with carboplatin, etoposide, and Imfinzi.  The adverse side effects of treatment were discussed including but not limited to risk for infection and even death.  The patient opted to resume treatment. I reviewed these side effects again with the patient today.   The patient is here today to restart cycle #1 of treatment. I reviewed the dosing with Dr. Julien Nordmann who recommends carboplatin for an AUC of 5, Etoposide 100 mg/m2, and Imfinzi. He also will be on Cosela for myeloprotection. However, we will need to monitor his labs closely weekly. I will arrange for weekly lab appointments.  Labs were reviewed for today.  Recommend that he proceed with cycle #1 today scheduled.  We will see him back for follow-up visit in 3 weeks for evaluation before starting cycle #2.  He will continue on Xarelto for the DVT.  He will continue to follow with Dr. Kae Heller from cardiology due to his congestive heart failure. His potassium is a little low today at 3.0. He has not received his mail order 90 day supply of potassium. I will send him a 7 day supply to a local pharmacy for his hypokalemia. He will call the mail order pharmacy to schedule the shipment of potassium.   The patient was advised to call immediately if he has any concerning symptoms in the interval. The patient voices understanding of current disease status and treatment options and is in  agreement with the current care plan. All questions were answered. The patient knows to call the clinic with any problems, questions or concerns. We can certainly see the patient much sooner if necessary        No orders of the defined types were placed in this encounter.    I spent 20-29 minutes in this encounter.   Ameya Vowell L Fabian Coca, PA-C 01/15/21

## 2021-01-16 ENCOUNTER — Other Ambulatory Visit: Payer: Self-pay

## 2021-01-16 ENCOUNTER — Inpatient Hospital Stay: Payer: Medicare Other | Attending: Physician Assistant

## 2021-01-16 ENCOUNTER — Inpatient Hospital Stay: Payer: Medicare Other

## 2021-01-16 ENCOUNTER — Inpatient Hospital Stay (HOSPITAL_BASED_OUTPATIENT_CLINIC_OR_DEPARTMENT_OTHER): Payer: Medicare Other | Admitting: Physician Assistant

## 2021-01-16 ENCOUNTER — Inpatient Hospital Stay: Payer: Medicare Other | Admitting: Nutrition

## 2021-01-16 VITALS — BP 109/88 | HR 96 | Temp 97.8°F | Resp 18 | Ht 72.0 in | Wt 187.0 lb

## 2021-01-16 DIAGNOSIS — C778 Secondary and unspecified malignant neoplasm of lymph nodes of multiple regions: Secondary | ICD-10-CM | POA: Insufficient documentation

## 2021-01-16 DIAGNOSIS — Z95828 Presence of other vascular implants and grafts: Secondary | ICD-10-CM

## 2021-01-16 DIAGNOSIS — Z5111 Encounter for antineoplastic chemotherapy: Secondary | ICD-10-CM | POA: Insufficient documentation

## 2021-01-16 DIAGNOSIS — E876 Hypokalemia: Secondary | ICD-10-CM | POA: Diagnosis not present

## 2021-01-16 DIAGNOSIS — Z5112 Encounter for antineoplastic immunotherapy: Secondary | ICD-10-CM

## 2021-01-16 DIAGNOSIS — C3490 Malignant neoplasm of unspecified part of unspecified bronchus or lung: Secondary | ICD-10-CM

## 2021-01-16 DIAGNOSIS — Z87891 Personal history of nicotine dependence: Secondary | ICD-10-CM | POA: Insufficient documentation

## 2021-01-16 DIAGNOSIS — C7931 Secondary malignant neoplasm of brain: Secondary | ICD-10-CM | POA: Insufficient documentation

## 2021-01-16 DIAGNOSIS — C342 Malignant neoplasm of middle lobe, bronchus or lung: Secondary | ICD-10-CM | POA: Diagnosis present

## 2021-01-16 DIAGNOSIS — Z79899 Other long term (current) drug therapy: Secondary | ICD-10-CM | POA: Insufficient documentation

## 2021-01-16 DIAGNOSIS — C3411 Malignant neoplasm of upper lobe, right bronchus or lung: Secondary | ICD-10-CM

## 2021-01-16 LAB — CMP (CANCER CENTER ONLY)
ALT: 40 U/L (ref 0–44)
AST: 30 U/L (ref 15–41)
Albumin: 2.9 g/dL — ABNORMAL LOW (ref 3.5–5.0)
Alkaline Phosphatase: 157 U/L — ABNORMAL HIGH (ref 38–126)
Anion gap: 14 (ref 5–15)
BUN: 23 mg/dL — ABNORMAL HIGH (ref 6–20)
CO2: 25 mmol/L (ref 22–32)
Calcium: 8.5 mg/dL — ABNORMAL LOW (ref 8.9–10.3)
Chloride: 108 mmol/L (ref 98–111)
Creatinine: 1.43 mg/dL — ABNORMAL HIGH (ref 0.61–1.24)
GFR, Estimated: 57 mL/min — ABNORMAL LOW (ref >60)
Glucose, Bld: 105 mg/dL — ABNORMAL HIGH (ref 70–99)
Potassium: 3 mmol/L — ABNORMAL LOW (ref 3.5–5.1)
Sodium: 147 mmol/L — ABNORMAL HIGH (ref 135–145)
Total Bilirubin: 0.6 mg/dL (ref 0.3–1.2)
Total Protein: 6.2 g/dL — ABNORMAL LOW (ref 6.5–8.1)

## 2021-01-16 LAB — CBC WITH DIFFERENTIAL (CANCER CENTER ONLY)
Abs Immature Granulocytes: 0.03 10*3/uL (ref 0.00–0.07)
Basophils Absolute: 0 10*3/uL (ref 0.0–0.1)
Basophils Relative: 0 %
Eosinophils Absolute: 0.1 10*3/uL (ref 0.0–0.5)
Eosinophils Relative: 1 %
HCT: 38.1 % — ABNORMAL LOW (ref 39.0–52.0)
Hemoglobin: 11.8 g/dL — ABNORMAL LOW (ref 13.0–17.0)
Immature Granulocytes: 0 %
Lymphocytes Relative: 22 %
Lymphs Abs: 2.2 10*3/uL (ref 0.7–4.0)
MCH: 29.5 pg (ref 26.0–34.0)
MCHC: 31 g/dL (ref 30.0–36.0)
MCV: 95.3 fL (ref 80.0–100.0)
Monocytes Absolute: 1 10*3/uL (ref 0.1–1.0)
Monocytes Relative: 10 %
Neutro Abs: 6.7 10*3/uL (ref 1.7–7.7)
Neutrophils Relative %: 67 %
Platelet Count: 244 10*3/uL (ref 150–400)
RBC: 4 MIL/uL — ABNORMAL LOW (ref 4.22–5.81)
RDW: 17.7 % — ABNORMAL HIGH (ref 11.5–15.5)
WBC Count: 10.1 10*3/uL (ref 4.0–10.5)
nRBC: 0 % (ref 0.0–0.2)

## 2021-01-16 LAB — TSH: TSH: 1.69 u[IU]/mL (ref 0.320–4.118)

## 2021-01-16 MED ORDER — PALONOSETRON HCL INJECTION 0.25 MG/5ML
INTRAVENOUS | Status: AC
  Filled 2021-01-16: qty 5

## 2021-01-16 MED ORDER — SODIUM CHLORIDE 0.9% FLUSH
10.0000 mL | INTRAVENOUS | Status: DC | PRN
  Filled 2021-01-16: qty 10

## 2021-01-16 MED ORDER — POTASSIUM CHLORIDE CRYS ER 20 MEQ PO TBCR: 20.0000 meq | EXTENDED_RELEASE_TABLET | Freq: Every day | ORAL | 0 refills | Status: DC

## 2021-01-16 MED ORDER — SODIUM CHLORIDE 0.9 % IV SOLN
10.0000 mg | Freq: Once | INTRAVENOUS | Status: AC
  Administered 2021-01-16: 10 mg via INTRAVENOUS
  Filled 2021-01-16: qty 10

## 2021-01-16 MED ORDER — HEPARIN SOD (PORK) LOCK FLUSH 100 UNIT/ML IV SOLN
500.0000 [IU] | Freq: Once | INTRAVENOUS | Status: DC | PRN
  Filled 2021-01-16: qty 5

## 2021-01-16 MED ORDER — SODIUM CHLORIDE 0.9% FLUSH
10.0000 mL | INTRAVENOUS | Status: DC | PRN
  Administered 2021-01-16: 10 mL
  Filled 2021-01-16: qty 10

## 2021-01-16 MED ORDER — SODIUM CHLORIDE 0.9 % IV SOLN
95.0000 mg/m2 | Freq: Once | INTRAVENOUS | Status: AC
  Administered 2021-01-16: 200 mg via INTRAVENOUS
  Filled 2021-01-16: qty 10

## 2021-01-16 MED ORDER — PALONOSETRON HCL INJECTION 0.25 MG/5ML
0.2500 mg | Freq: Once | INTRAVENOUS | Status: AC
  Administered 2021-01-16: 0.25 mg via INTRAVENOUS

## 2021-01-16 MED ORDER — TRILACICLIB DIHYDROCHLORIDE INJECTION 300 MG
240.0000 mg/m2 | Freq: Once | INTRAVENOUS | Status: AC
  Administered 2021-01-16: 495 mg via INTRAVENOUS
  Filled 2021-01-16: qty 33

## 2021-01-16 MED ORDER — SODIUM CHLORIDE 0.9 % IV SOLN
150.0000 mg | Freq: Once | INTRAVENOUS | Status: AC
  Administered 2021-01-16: 150 mg via INTRAVENOUS
  Filled 2021-01-16: qty 150

## 2021-01-16 MED ORDER — SODIUM CHLORIDE 0.9 % IV SOLN
1200.0000 mg | Freq: Once | INTRAVENOUS | Status: AC
  Administered 2021-01-16: 1200 mg via INTRAVENOUS
  Filled 2021-01-16: qty 20

## 2021-01-16 MED ORDER — SODIUM CHLORIDE 0.9 % IV SOLN
443.5000 mg | Freq: Once | INTRAVENOUS | Status: AC
  Administered 2021-01-16: 440 mg via INTRAVENOUS
  Filled 2021-01-16: qty 44

## 2021-01-16 MED ORDER — SODIUM CHLORIDE 0.9 % IV SOLN
Freq: Once | INTRAVENOUS | Status: AC
  Filled 2021-01-16: qty 250

## 2021-01-16 NOTE — Patient Instructions (Signed)

## 2021-01-16 NOTE — Progress Notes (Signed)
60 year old male diagnosed with extensive stage small cell lung cancer followed by Dr. Julien Nordmann resuming chemotherapy consisting of carboplatin, etoposide, Imfinzi.  Patient status post whole brain radiation therapy.  Past medical history includes CHF, home O2, alcohol, cocaine, dysphagia, CVA, and diabetes type 2.  Medications include Lasix, Lantus, Humalog, Compazine, Senokot, and vitamin D.  Labs include sodium 147, potassium 3.0, glucose 105, BUN 23, creatinine 1.43, and albumin 2.9.  Height: 6 feet 0 inches. Weight: 187 pounds. Usual body weight: 200 pounds. BMI: 25.36. ECOG: 2.  Noted evidence of disease progression.  Patient status post diuresis in the hospital.  Appetite is within normal limits and he describes it as "good".  He can eat anything he would like to eat.  He does have some increased fatigue.  Noted constipation controlled on Senokot.  Noted lower leg edema.  Patient reports he likes Glucerna but has not been drinking it as often because he feels he is eating more.  Nutrition diagnosis: Food and nutrition related knowledge deficit related to extensive stage small cell lung cancer and associated treatments as evidenced by no prior need for nutrition information.  Intervention: Educated to consume small frequent meals and snacks with high-calorie, high-protein foods. Continue Glucerna or equivalent twice daily between meals.  Provided a sample of [employer] 1.2 glucose support.  Patient enjoyed this supplement so I have ordered additional samples to be sent to him at home. Brief education provided on constipation and encouraged increased fluids as allowed. Questions were answered.  Nutrition fact sheets provided.  Contact information given.  Monitoring, evaluation, goals: Patient will tolerate adequate calories and protein to maintain lean body mass.  Next visit: Tuesday, April 26 during infusion.  **Disclaimer: This note was dictated with voice recognition software.  Similar sounding words can inadvertently be transcribed and this note may contain transcription errors which may not have been corrected upon publication of note.**

## 2021-01-16 NOTE — Patient Instructions (Signed)
[facility] Discharge Instructions for Patients Receiving Chemotherapy  Today you received the following chemotherapy agents Tecentriq, Carboplatin, Etoposide.  To help prevent nausea and vomiting after your treatment, we encourage you to take your nausea medication as directed.    If you develop nausea and vomiting that is not controlled by your nausea medication, call the clinic.   BELOW ARE SYMPTOMS THAT SHOULD BE REPORTED IMMEDIATELY:  *FEVER GREATER THAN 100.5 F  *CHILLS WITH OR WITHOUT FEVER  NAUSEA AND VOMITING THAT IS NOT CONTROLLED WITH YOUR NAUSEA MEDICATION  *UNUSUAL SHORTNESS OF BREATH  *UNUSUAL BRUISING OR BLEEDING  TENDERNESS IN MOUTH AND THROAT WITH OR WITHOUT PRESENCE OF ULCERS  *URINARY PROBLEMS  *BOWEL PROBLEMS  UNUSUAL RASH Items with * indicate a potential emergency and should be followed up as soon as possible.  Feel free to call the clinic should you have any questions or concerns. The clinic phone number is [phone].  Please show the [facility] at check-in to the Emergency Department and triage nurse.  Atezolizumab injection What is this medicine? ATEZOLIZUMAB (a te zoe LIZ ue mab) is a monoclonal antibody. It is used to treat bladder cancer (urothelial cancer), liver cancer, lung cancer, and melanoma. This medicine may be used for other purposes; ask your health care provider or pharmacist if you have questions. COMMON BRAND NAME(S): Tecentriq What should I tell my health care provider before I take this medicine? They need to know if you have any of these conditions:  autoimmune diseases like Crohn's disease, ulcerative colitis, or lupus  have had or planning to have an allogeneic stem cell transplant (uses someone else's stem cells)  history of organ transplant  history of radiation to the chest  nervous system problems like myasthenia gravis or Guillain-Barre syndrome  an unusual or allergic reaction to  atezolizumab, other medicines, foods, dyes, or preservatives  pregnant or trying to get pregnant  breast-feeding How should I use this medicine? This medicine is for infusion into a vein. It is given by a health care professional in a hospital or clinic setting. A special MedGuide will be given to you before each treatment. Be sure to read this information carefully each time. Talk to your pediatrician regarding the use of this medicine in children. Special care may be needed. Overdosage: If you think you have taken too much of this medicine contact a poison control center or emergency room at once. NOTE: This medicine is only for you. Do not share this medicine with others. What if I miss a dose? It is important not to miss your dose. Call your doctor or health care professional if you are unable to keep an appointment. What may interact with this medicine? Interactions have not been studied. This list may not describe all possible interactions. Give your health care provider a list of all the medicines, herbs, non-prescription drugs, or dietary supplements you use. Also tell them if you smoke, drink alcohol, or use illegal drugs. Some items may interact with your medicine. What should I watch for while using this medicine? Your condition will be monitored carefully while you are receiving this medicine. You may need blood work done while you are taking this medicine. Do not become pregnant while taking this medicine or for at least 5 months after stopping it. Women should inform their doctor if they wish to become pregnant or think they might be pregnant. There is a potential for serious side effects to an unborn child. Talk to  your health care professional or pharmacist for more information. Do not breast-feed an infant while taking this medicine or for at least 5 months after the last dose. What side effects may I notice from receiving this medicine? Side effects that you should report to  your doctor or health care professional as soon as possible:  allergic reactions like skin rash, itching or hives, swelling of the face, lips, or tongue  black, tarry stools  bloody or watery diarrhea  breathing problems  changes in vision  chest pain or chest tightness  chills  facial flushing  fever  headache  signs and symptoms of high blood sugar such as dizziness; dry mouth; dry skin; fruity breath; nausea; stomach pain; increased hunger or thirst; increased urination  signs and symptoms of liver injury like dark yellow or brown urine; general ill feeling or flu-like symptoms; light-colored stools; loss of appetite; nausea; right upper belly pain; unusually weak or tired; yellowing of the eyes or skin  stomach pain  trouble passing urine or change in the amount of urine Side effects that usually do not require medical attention (report to your doctor or health care professional if they continue or are bothersome):  bone pain  cough  diarrhea  joint pain  muscle pain  muscle weakness  swelling of arms or legs  tiredness  weight loss This list may not describe all possible side effects. Call your doctor for medical advice about side effects. You may report side effects to FDA at 1-800-FDA-1088. Where should I keep my medicine? This drug is given in a hospital or clinic and will not be stored at home. NOTE: This sheet is a summary. It may not cover all possible information. If you have questions about this medicine, talk to your doctor, pharmacist, or health care provider.  2021 Elsevier/Gold Standard (2020-06-28 13:59:34)  Carboplatin injection What is this medicine? CARBOPLATIN (KAR boe pla tin) is a chemotherapy drug. It targets fast dividing cells, like cancer cells, and causes these cells to die. This medicine is used to treat ovarian cancer and many other cancers. This medicine may be used for other purposes; ask your health care provider or  pharmacist if you have questions. COMMON BRAND NAME(S): Paraplatin What should I tell my health care provider before I take this medicine? They need to know if you have any of these conditions:  blood disorders  hearing problems  kidney disease  recent or ongoing radiation therapy  an unusual or allergic reaction to carboplatin, cisplatin, other chemotherapy, other medicines, foods, dyes, or preservatives  pregnant or trying to get pregnant  breast-feeding How should I use this medicine? This drug is usually given as an infusion into a vein. It is administered in a hospital or clinic by a specially trained health care professional. Talk to your pediatrician regarding the use of this medicine in children. Special care may be needed. Overdosage: If you think you have taken too much of this medicine contact a poison control center or emergency room at once. NOTE: This medicine is only for you. Do not share this medicine with others. What if I miss a dose? It is important not to miss a dose. Call your doctor or health care professional if you are unable to keep an appointment. What may interact with this medicine?  medicines for seizures  medicines to increase blood counts like filgrastim, pegfilgrastim, sargramostim  some antibiotics like amikacin, gentamicin, neomycin, streptomycin, tobramycin  vaccines Talk to your doctor or health care professional  before taking any of these medicines:  acetaminophen  aspirin  ibuprofen  ketoprofen  naproxen This list may not describe all possible interactions. Give your health care provider a list of all the medicines, herbs, non-prescription drugs, or dietary supplements you use. Also tell them if you smoke, drink alcohol, or use illegal drugs. Some items may interact with your medicine. What should I watch for while using this medicine? Your condition will be monitored carefully while you are receiving this medicine. You will need  important blood work done while you are taking this medicine. This drug may make you feel generally unwell. This is not uncommon, as chemotherapy can affect healthy cells as well as cancer cells. Report any side effects. Continue your course of treatment even though you feel ill unless your doctor tells you to stop. In some cases, you may be given additional medicines to help with side effects. Follow all directions for their use. Call your doctor or health care professional for advice if you get a fever, chills or sore throat, or other symptoms of a cold or flu. Do not treat yourself. This drug decreases your body's ability to fight infections. Try to avoid being around people who are sick. This medicine may increase your risk to bruise or bleed. Call your doctor or health care professional if you notice any unusual bleeding. Be careful brushing and flossing your teeth or using a toothpick because you may get an infection or bleed more easily. If you have any dental work done, tell your dentist you are receiving this medicine. Avoid taking products that contain aspirin, acetaminophen, ibuprofen, naproxen, or ketoprofen unless instructed by your doctor. These medicines may hide a fever. Do not become pregnant while taking this medicine. Women should inform their doctor if they wish to become pregnant or think they might be pregnant. There is a potential for serious side effects to an unborn child. Talk to your health care professional or pharmacist for more information. Do not breast-feed an infant while taking this medicine. What side effects may I notice from receiving this medicine? Side effects that you should report to your doctor or health care professional as soon as possible:  allergic reactions like skin rash, itching or hives, swelling of the face, lips, or tongue  signs of infection - fever or chills, cough, sore throat, pain or difficulty passing urine  signs of decreased platelets or  bleeding - bruising, pinpoint red spots on the skin, black, tarry stools, nosebleeds  signs of decreased red blood cells - unusually weak or tired, fainting spells, lightheadedness  breathing problems  changes in hearing  changes in vision  chest pain  high blood pressure  low blood counts - This drug may decrease the number of white blood cells, red blood cells and platelets. You may be at increased risk for infections and bleeding.  nausea and vomiting  pain, swelling, redness or irritation at the injection site  pain, tingling, numbness in the hands or feet  problems with balance, talking, walking  trouble passing urine or change in the amount of urine Side effects that usually do not require medical attention (report to your doctor or health care professional if they continue or are bothersome):  hair loss  loss of appetite  metallic taste in the mouth or changes in taste This list may not describe all possible side effects. Call your doctor for medical advice about side effects. You may report side effects to FDA at 1-800-FDA-1088. Where should I keep  my medicine? This drug is given in a hospital or clinic and will not be stored at home. NOTE: This sheet is a summary. It may not cover all possible information. If you have questions about this medicine, talk to your doctor, pharmacist, or health care provider.  2021 Elsevier/Gold Standard (2008-01-04 14:38:05)  Etoposide, VP-16 injection What is this medicine? ETOPOSIDE, VP-16 (e toe POE side) is a chemotherapy drug. It is used to treat testicular cancer, lung cancer, and other cancers. This medicine may be used for other purposes; ask your health care provider or pharmacist if you have questions. COMMON BRAND NAME(S): Etopophos, Toposar, VePesid What should I tell my health care provider before I take this medicine? They need to know if you have any of these conditions:  infection  kidney disease  liver  disease  low blood counts, like low white cell, platelet, or red cell counts  an unusual or allergic reaction to etoposide, other medicines, foods, dyes, or preservatives  pregnant or trying to get pregnant  breast-feeding How should I use this medicine? This medicine is for infusion into a vein. It is administered in a hospital or clinic by a specially trained health care professional. Talk to your pediatrician regarding the use of this medicine in children. Special care may be needed. Overdosage: If you think you have taken too much of this medicine contact a poison control center or emergency room at once. NOTE: This medicine is only for you. Do not share this medicine with others. What if I miss a dose? It is important not to miss your dose. Call your doctor or health care professional if you are unable to keep an appointment. What may interact with this medicine? This medicine may interact with the following medications:  warfarin This list may not describe all possible interactions. Give your health care provider a list of all the medicines, herbs, non-prescription drugs, or dietary supplements you use. Also tell them if you smoke, drink alcohol, or use illegal drugs. Some items may interact with your medicine. What should I watch for while using this medicine? Visit your doctor for checks on your progress. This drug may make you feel generally unwell. This is not uncommon, as chemotherapy can affect healthy cells as well as cancer cells. Report any side effects. Continue your course of treatment even though you feel ill unless your doctor tells you to stop. In some cases, you may be given additional medicines to help with side effects. Follow all directions for their use. Call your doctor or health care professional for advice if you get a fever, chills or sore throat, or other symptoms of a cold or flu. Do not treat yourself. This drug decreases your body's ability to fight  infections. Try to avoid being around people who are sick. This medicine may increase your risk to bruise or bleed. Call your doctor or health care professional if you notice any unusual bleeding. Talk to your doctor about your risk of cancer. You may be more at risk for certain types of cancers if you take this medicine. Do not become pregnant while taking this medicine or for at least 6 months after stopping it. Women should inform their doctor if they wish to become pregnant or think they might be pregnant. Women of child-bearing potential will need to have a negative pregnancy test before starting this medicine. There is a potential for serious side effects to an unborn child. Talk to your health care professional or pharmacist for more  information. Do not breast-feed an infant while taking this medicine. Men must use a latex condom during sexual contact with a woman while taking this medicine and for at least 4 months after stopping it. A latex condom is needed even if you have had a vasectomy. Contact your doctor right away if your partner becomes pregnant. Do not donate sperm while taking this medicine and for at least 4 months after you stop taking this medicine. Men should inform their doctors if they wish to father a child. This medicine may lower sperm counts. What side effects may I notice from receiving this medicine? Side effects that you should report to your doctor or health care professional as soon as possible:  allergic reactions like skin rash, itching or hives, swelling of the face, lips, or tongue  low blood counts - this medicine may decrease the number of white blood cells, red blood cells, and platelets. You may be at increased risk for infections and bleeding  nausea, vomiting  redness, blistering, peeling or loosening of the skin, including inside the mouth  signs and symptoms of infection like fever; chills; cough; sore throat; pain or trouble passing urine  signs and  symptoms of low red blood cells or anemia such as unusually weak or tired; feeling faint or lightheaded; falls; breathing problems  unusual bruising or bleeding Side effects that usually do not require medical attention (report to your doctor or health care professional if they continue or are bothersome):  changes in taste  diarrhea  hair loss  loss of appetite  mouth sores This list may not describe all possible side effects. Call your doctor for medical advice about side effects. You may report side effects to FDA at 1-800-FDA-1088. Where should I keep my medicine? This drug is given in a hospital or clinic and will not be stored at home. NOTE: This sheet is a summary. It may not cover all possible information. If you have questions about this medicine, talk to your doctor, pharmacist, or health care provider.  2021 Elsevier/Gold Standard (2018-11-24 16:57:15)

## 2021-01-17 ENCOUNTER — Other Ambulatory Visit: Payer: Self-pay

## 2021-01-17 ENCOUNTER — Inpatient Hospital Stay: Payer: Medicare Other

## 2021-01-17 VITALS — BP 110/82 | HR 95 | Temp 98.0°F | Resp 18

## 2021-01-17 DIAGNOSIS — Z5112 Encounter for antineoplastic immunotherapy: Secondary | ICD-10-CM | POA: Diagnosis not present

## 2021-01-17 DIAGNOSIS — C3411 Malignant neoplasm of upper lobe, right bronchus or lung: Secondary | ICD-10-CM

## 2021-01-17 MED ORDER — SODIUM CHLORIDE 0.9 % IV SOLN
Freq: Once | INTRAVENOUS | Status: AC
  Filled 2021-01-17: qty 250

## 2021-01-17 MED ORDER — HEPARIN SOD (PORK) LOCK FLUSH 100 UNIT/ML IV SOLN
500.0000 [IU] | Freq: Once | INTRAVENOUS | Status: AC | PRN
  Administered 2021-01-17: 500 [IU]
  Filled 2021-01-17: qty 5

## 2021-01-17 MED ORDER — TRILACICLIB DIHYDROCHLORIDE INJECTION 300 MG
240.0000 mg/m2 | Freq: Once | INTRAVENOUS | Status: AC
  Administered 2021-01-17: 495 mg via INTRAVENOUS
  Filled 2021-01-17: qty 33

## 2021-01-17 MED ORDER — SODIUM CHLORIDE 0.9 % IV SOLN
10.0000 mg | Freq: Once | INTRAVENOUS | Status: AC
  Administered 2021-01-17: 10 mg via INTRAVENOUS
  Filled 2021-01-17: qty 10
  Filled 2021-01-17: qty 1

## 2021-01-17 MED ORDER — ETOPOSIDE CHEMO INJECTION 1 GM/50ML
95.0000 mg/m2 | Freq: Once | INTRAVENOUS | Status: AC
  Administered 2021-01-17: 200 mg via INTRAVENOUS
  Filled 2021-01-17: qty 10

## 2021-01-17 MED ORDER — SODIUM CHLORIDE 0.9% FLUSH
10.0000 mL | INTRAVENOUS | Status: DC | PRN
  Administered 2021-01-17: 10 mL
  Filled 2021-01-17: qty 10

## 2021-01-17 NOTE — Patient Instructions (Signed)
[facility] Discharge Instructions for Patients Receiving Chemotherapy  Today you received the following chemotherapy agents: etoposide  To help prevent nausea and vomiting after your treatment, we encourage you to take your nausea medication as directed.   If you develop nausea and vomiting that is not controlled by your nausea medication, call the clinic.   BELOW ARE SYMPTOMS THAT SHOULD BE REPORTED IMMEDIATELY:  *FEVER GREATER THAN 100.5 F  *CHILLS WITH OR WITHOUT FEVER  NAUSEA AND VOMITING THAT IS NOT CONTROLLED WITH YOUR NAUSEA MEDICATION  *UNUSUAL SHORTNESS OF BREATH  *UNUSUAL BRUISING OR BLEEDING  TENDERNESS IN MOUTH AND THROAT WITH OR WITHOUT PRESENCE OF ULCERS  *URINARY PROBLEMS  *BOWEL PROBLEMS  UNUSUAL RASH Items with * indicate a potential emergency and should be followed up as soon as possible.  Feel free to call the clinic should you have any questions or concerns. The clinic phone number is [phone].  Please show the CHEMO ALERT CARD at check-in to the Emergency Department and triage nurse.   

## 2021-01-18 ENCOUNTER — Other Ambulatory Visit: Payer: Self-pay

## 2021-01-18 ENCOUNTER — Telehealth: Payer: Self-pay | Admitting: Physician Assistant

## 2021-01-18 ENCOUNTER — Inpatient Hospital Stay: Payer: Medicare Other

## 2021-01-18 VITALS — BP 109/85 | HR 96 | Temp 97.9°F | Resp 20 | Ht 72.0 in | Wt 189.8 lb

## 2021-01-18 DIAGNOSIS — C3411 Malignant neoplasm of upper lobe, right bronchus or lung: Secondary | ICD-10-CM

## 2021-01-18 DIAGNOSIS — Z5112 Encounter for antineoplastic immunotherapy: Secondary | ICD-10-CM | POA: Diagnosis not present

## 2021-01-18 MED ORDER — SODIUM CHLORIDE 0.9 % IV SOLN
Freq: Once | INTRAVENOUS | Status: AC
  Filled 2021-01-18: qty 250

## 2021-01-18 MED ORDER — HEPARIN SOD (PORK) LOCK FLUSH 100 UNIT/ML IV SOLN
500.0000 [IU] | Freq: Once | INTRAVENOUS | Status: AC | PRN
  Administered 2021-01-18: 500 [IU]
  Filled 2021-01-18: qty 5

## 2021-01-18 MED ORDER — SODIUM CHLORIDE 0.9 % IV SOLN
95.0000 mg/m2 | Freq: Once | INTRAVENOUS | Status: AC
  Administered 2021-01-18: 200 mg via INTRAVENOUS
  Filled 2021-01-18: qty 10

## 2021-01-18 MED ORDER — TRILACICLIB DIHYDROCHLORIDE INJECTION 300 MG
240.0000 mg/m2 | Freq: Once | INTRAVENOUS | Status: AC
  Administered 2021-01-18: 495 mg via INTRAVENOUS
  Filled 2021-01-18: qty 33

## 2021-01-18 MED ORDER — SODIUM CHLORIDE 0.9% FLUSH
10.0000 mL | INTRAVENOUS | Status: DC | PRN
Start: 2021-01-18 — End: 2021-01-18
  Administered 2021-01-18: 10 mL
  Filled 2021-01-18: qty 10

## 2021-01-18 MED ORDER — SODIUM CHLORIDE 0.9 % IV SOLN
10.0000 mg | Freq: Once | INTRAVENOUS | Status: AC
  Administered 2021-01-18: 10 mg via INTRAVENOUS
  Filled 2021-01-18: qty 10

## 2021-01-18 NOTE — Telephone Encounter (Signed)
Scheduled per los. Called and left msg. Will get printout today in infusion

## 2021-01-18 NOTE — Patient Instructions (Signed)
[facility] Discharge Instructions for Patients Receiving Chemotherapy  Today you received the following chemotherapy agents: etoposide  To help prevent nausea and vomiting after your treatment, we encourage you to take your nausea medication as directed.   If you develop nausea and vomiting that is not controlled by your nausea medication, call the clinic.   BELOW ARE SYMPTOMS THAT SHOULD BE REPORTED IMMEDIATELY:  *FEVER GREATER THAN 100.5 F  *CHILLS WITH OR WITHOUT FEVER  NAUSEA AND VOMITING THAT IS NOT CONTROLLED WITH YOUR NAUSEA MEDICATION  *UNUSUAL SHORTNESS OF BREATH  *UNUSUAL BRUISING OR BLEEDING  TENDERNESS IN MOUTH AND THROAT WITH OR WITHOUT PRESENCE OF ULCERS  *URINARY PROBLEMS  *BOWEL PROBLEMS  UNUSUAL RASH Items with * indicate a potential emergency and should be followed up as soon as possible.  Feel free to call the clinic should you have any questions or concerns. The clinic phone number is [phone].  Please show the CHEMO ALERT CARD at check-in to the Emergency Department and triage nurse.   

## 2021-01-21 ENCOUNTER — Telehealth: Payer: Self-pay | Admitting: *Deleted

## 2021-01-21 ENCOUNTER — Other Ambulatory Visit (HOSPITAL_COMMUNITY): Payer: Self-pay | Admitting: *Deleted

## 2021-01-22 ENCOUNTER — Ambulatory Visit (HOSPITAL_COMMUNITY)
Admit: 2021-01-22 | Discharge: 2021-01-22 | Disposition: A | Discharge status: Home / Self Care | Payer: Medicare Other | Source: Ambulatory Visit | Attending: Cardiology | Admitting: Cardiology

## 2021-01-22 ENCOUNTER — Encounter (HOSPITAL_COMMUNITY): Payer: Self-pay

## 2021-01-22 ENCOUNTER — Other Ambulatory Visit: Payer: Self-pay

## 2021-01-22 ENCOUNTER — Telehealth (HOSPITAL_COMMUNITY): Payer: Self-pay | Admitting: Cardiology

## 2021-01-22 VITALS — BP 118/88 | HR 103 | Wt 178.2 lb

## 2021-01-22 DIAGNOSIS — Z7951 Long term (current) use of inhaled steroids: Secondary | ICD-10-CM | POA: Insufficient documentation

## 2021-01-22 DIAGNOSIS — Z79899 Other long term (current) drug therapy: Secondary | ICD-10-CM | POA: Insufficient documentation

## 2021-01-22 DIAGNOSIS — F1911 Other psychoactive substance abuse, in remission: Secondary | ICD-10-CM | POA: Insufficient documentation

## 2021-01-22 DIAGNOSIS — I5022 Chronic systolic (congestive) heart failure: Secondary | ICD-10-CM | POA: Diagnosis not present

## 2021-01-22 DIAGNOSIS — Z8249 Family history of ischemic heart disease and other diseases of the circulatory system: Secondary | ICD-10-CM | POA: Insufficient documentation

## 2021-01-22 DIAGNOSIS — Z87891 Personal history of nicotine dependence: Secondary | ICD-10-CM | POA: Diagnosis not present

## 2021-01-22 DIAGNOSIS — Z8616 Personal history of COVID-19: Secondary | ICD-10-CM | POA: Insufficient documentation

## 2021-01-22 DIAGNOSIS — Z9119 Patient's noncompliance with other medical treatment and regimen: Secondary | ICD-10-CM | POA: Insufficient documentation

## 2021-01-22 DIAGNOSIS — Z794 Long term (current) use of insulin: Secondary | ICD-10-CM | POA: Diagnosis not present

## 2021-01-22 DIAGNOSIS — I428 Other cardiomyopathies: Secondary | ICD-10-CM | POA: Diagnosis not present

## 2021-01-22 DIAGNOSIS — J449 Chronic obstructive pulmonary disease, unspecified: Secondary | ICD-10-CM | POA: Diagnosis not present

## 2021-01-22 DIAGNOSIS — Z7901 Long term (current) use of anticoagulants: Secondary | ICD-10-CM | POA: Diagnosis not present

## 2021-01-22 DIAGNOSIS — E119 Type 2 diabetes mellitus without complications: Secondary | ICD-10-CM | POA: Diagnosis not present

## 2021-01-22 DIAGNOSIS — C3490 Malignant neoplasm of unspecified part of unspecified bronchus or lung: Secondary | ICD-10-CM | POA: Insufficient documentation

## 2021-01-22 DIAGNOSIS — C7931 Secondary malignant neoplasm of brain: Secondary | ICD-10-CM | POA: Diagnosis not present

## 2021-01-22 DIAGNOSIS — I2510 Atherosclerotic heart disease of native coronary artery without angina pectoris: Secondary | ICD-10-CM | POA: Insufficient documentation

## 2021-01-22 DIAGNOSIS — Z86718 Personal history of other venous thrombosis and embolism: Secondary | ICD-10-CM | POA: Diagnosis not present

## 2021-01-22 DIAGNOSIS — E876 Hypokalemia: Secondary | ICD-10-CM

## 2021-01-22 DIAGNOSIS — Z8673 Personal history of transient ischemic attack (TIA), and cerebral infarction without residual deficits: Secondary | ICD-10-CM | POA: Insufficient documentation

## 2021-01-22 DIAGNOSIS — Z7952 Long term (current) use of systemic steroids: Secondary | ICD-10-CM | POA: Diagnosis not present

## 2021-01-22 LAB — BASIC METABOLIC PANEL
Anion gap: 5 (ref 5–15)
BUN: 28 mg/dL — ABNORMAL HIGH (ref 6–20)
CO2: 33 mmol/L — ABNORMAL HIGH (ref 22–32)
Calcium: 8.7 mg/dL — ABNORMAL LOW (ref 8.9–10.3)
Chloride: 95 mmol/L — ABNORMAL LOW (ref 98–111)
Creatinine, Ser: 1.7 mg/dL — ABNORMAL HIGH (ref 0.61–1.24)
GFR, Estimated: 46 mL/min — ABNORMAL LOW (ref >60)
Glucose, Bld: 474 mg/dL — ABNORMAL HIGH (ref 70–99)
Potassium: 4.3 mmol/L (ref 3.5–5.1)
Sodium: 133 mmol/L — ABNORMAL LOW (ref 135–145)

## 2021-01-22 MED ORDER — POTASSIUM CHLORIDE CRYS ER 20 MEQ PO TBCR: 20.0000 meq | EXTENDED_RELEASE_TABLET | Freq: Every day | ORAL | 5 refills | Status: DC

## 2021-01-22 NOTE — Progress Notes (Signed)
Patient ID: Dennis Gorter., male   DOB: December 03, 1960, 60 y.o.   MRN: [id number]      Advanced Heart Failure Clinic Note  HF MD: Dr Haroldine Laws PCP: [facility] EP: Dr Lovena Le   HPI: Dennis [name]. is a 60 y.o. 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  Admitted to [facility] in 4/16 with acute CVA and with L sided weakness. CT showed R MCA stroke. Did not receive TPA due to delay in arrival. Had ECHO that showed EF 10-15% mild RV HK. No obvious LV clot. As he improved he was transferred to CIR but then developed hypotension and transferred back to HF ward. Underwent RHC on 4/27. Filling pressures mildly elevated CO ok. Diuresed gently. Meds limited by SBP in 80s. Could only tolerate ivabradine 7.5 bid, digoxin and spiro 12.5. Lasix and carvedilol stopped.   Admitted in November 2020 with CP. Troponin negative, scheduled for stress test. Had Myoview 09/21/18 which showed no ischemia, but prior infarct, noted high risk due to low EF.   In March 2021, he was diagnosed with lung cancer  + brain metastasis. Received radiation and is continuing on chemo.     Admitted in October 2021 with syncope and  falls. BP was low so HF meds stopped and he was started on midodrine. MRI of brain concerning for metastatic disease. Discharged on midodrine 10 mg three times a day + abdominal binder/ compression stockings.   He was seen by Dr. Lovena Le 09/28/20 for follow-up of his ICD which had reached ERI, though still with 3 months before EOL. Decision made for ongoing watchful waiting and if patient had a good response to chemotherapy, would consider placing another ICD at that time.  He was admitted to [facility] 1/22 after falling on ice w/ subsequent left femur fracture. He was evaluated by ortho with plans for surgical repair after coumadin  reversal. He was taken to the OR 10/31/20 for IMN of his left femur fracture which occurred without complications. Also during admit, he incidentally tested + for COVID 19 but no major respiratory symptoms.   Admitted recently to [facility] 3/22 w/ increased dyspnea, felt multifactorial from a/c CHF + COPDE. Also found to have LLE DVT, treated w/ Xarelto. Chest CT showed no PE but did showing interval enlargement of spiculated nodule c/w worsening primary malignancy + newly enlarged pretracheal and subcarinal lymph nodes, consistent with nodal metastatic disease. Echo showed severely reduced LVEF down to 15% w/ severe MR. RV moderately reduced. He was treated w/ IV Lasix and transitioned back to higher dose of PO at d/c, lasix 40 mg daily. He had f/u CMP at the cancer center last week and SCr was stable but K was low at 3.0. He was given a 1 week supply of KCl and instructed to f/u in the AHF.   Today he returns for HF follow up. As noted above all HF meds stopped due to syncope/hypotension. Overall feeling ok. Recovered well from leg surgery. Presents in wheel chair. On supp O2, 2L/min baseline. Denies any increase dyspnea. BP stable w/ midodrine, 118/88. No further orthostatic symptoms. Device interrogation shows good impedence. No fluid overload. He is still undergoing treatment for lung cancer. Just started another round of chemo last week.     Echo 2016 EF 15%.  Echo (9/17): EF 25-30%, moderate LV  dilation with prominent trabeculation, normal RV size and systolic function.  Echo 08/12/17 EF 45-50%, much improved from previous  Per Dr Vaughan Browner Echo 10/2018 LVEF 20-25%, Grade 2 DD. Echo  10/ 2021 EF  25-30% RV normal.  Echo 3/22 LVEF 15%, RV moderately reduced, severe MR  RHC 02/07/15 RA = 3 RV = 50/4/7 PA = 55/14 (34) PCW = 22 Fick cardiac output/index = 4.6/2.4 Thermodilution CO/CI = 4.8/2.5 PVR = 2.5 WU SVR = 1443 FA sat = 96% PA sat = 51%, 56%, 61%  Noninvasive Ao pressure = 106/74 (86)    CPX 02/07/2016 Peak VO2: 18.7 (54% predicted peak VO2) VE/VCO2 slope: 30 OUES: 1.62 Peak RER:  1.13 Ventilatory Threshold: 10.9 (32% predicted or measured peak VO2)  CPX 12/24/16 Peak VO2 19.6 (58% predicted peak VO2) VE/VCO2 slope: 31 OUES: 1.79 Peak RER 1.09 Ventilatory threshold: 14.9 (44% predicted or measure peak VO2)   CPX 3/20 FVC 3.33 (76%)    FEV1 2.54 (75%)     FEV1/FVC 76 (97%)     MVV 116 (76%)   Resting HR: 94 Peak HR: 122  (74% age predicted max HR)  BP rest: 94/64 BP peak: 114/62  Peak VO2: 19.9 (60% predicted peak VO2)  VE/VCO2 slope: 31  OUES: 2.07  Peak RER: 1.09  Ventilatory Threshold: 15.3 (46% predicted or 77% measured peak VO2)  VE/MVV: 50%  O2pulse: 12  (80% predicted O2pulse)   Review of systems complete and found to be negative unless listed in HPI.    Past Medical History:  Diagnosis Date  . Alcohol abuse with alcohol-induced mood disorder ([place]) 08/18/2015  . Automatic implantable cardioverter-defibrillator in situ 2012   S/P St Jude Dual Chamber. ICD.  Marland Kitchen CAD (coronary artery disease) 11/16/2014   CAD Coronary angiography (12/2008) with mid LAD totally occluded and collateralized.  . Cardiac LV ejection fraction 10-20%   . Chest pain 09/04/2018  . CHF (congestive heart failure) ([place])   . Chronic systolic heart failure (HCC)    a) Mixed ICM/NICM b) RHC (05/2014): RA 2, RV 19/2/3, PA 22/14 (18), PCWP 6, Fick CO/CI: 5.2 / 2.7, PVR 2.3 WU, PA 60% and 64% c) ECHO (05/2014): EF 20-25%, diff HK, akinesis entireanteroseptal myocardium, triv AI, mod MR, LA mod/sev dilated  . Cocaine abuse ([place]) 01/24/2015  . Cocaine abuse with cocaine-induced mood disorder ([place]) 08/20/2015  . Drug abuse and dependence ([place])   . Dysphagia following cerebral infarction 01/24/2015  . Embolic stroke involving right middle cerebral artery ([place])   . Extensive stage primary small cell carcinoma of lung ([place]) 01/04/2020  . H/O noncompliance with medical treatment,  presenting hazards to health 01/24/2015  . Heart murmur   . High cholesterol   . Ischemic cardiomyopathy    a) Coronary angiography (12/2008) at [facility]: Lmain: nl, LAD mid 100% stenosis with left to left and right-to-left collaterals to the distal LAD; Lcx: nl, RCA nl.    . 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[place])   . MDD (major depressive disorder), recurrent severe, without psychosis ([place]) 08/18/2015  . Open wound of foot 02/27/2015  . Pneumonia 1988  . Psychoactive substance-induced mood disorder ([place]) 07/17/2015  . Sleep apnea    "cleared after T&A"  . Status post PICC central line placement   . Stroke ([place])   . Substance induced mood disorder ([place]) 08/17/2015  . Type II diabetes mellitus ([place])      Current Outpatient Medications on File Prior to Visit  Medication Sig Dispense Refill  . Accu-Chek Softclix  Lancets lancets     . atorvastatin (LIPITOR) 20 MG tablet TAKE 1 TABLET BY MOUTH DAILY AT 6 PM. 30 tablet 6  . Blood Glucose Monitoring Suppl (FIFTY50 GLUCOSE METER 2.0) w/Device KIT Use as instructed    . budesonide-formoterol (SYMBICORT) 160-4.5 MCG/ACT inhaler Inhale 2 puffs into the lungs 2 (two) times daily. 1 each 12  . dexamethasone (DECADRON) 1 MG tablet Take 1 tablet (1 mg total) by mouth daily. 60 tablet 2  . feeding supplement (ENSURE ENLIVE / ENSURE PLUS) LIQD Take 237 mLs by mouth 2 (two) times daily between meals. 237 mL 0  . furosemide (LASIX) 40 MG tablet Take 1 tablet (40 mg total) by mouth daily. 90 tablet 0  . insulin glargine (LANTUS SOLOSTAR) 100 UNIT/ML Solostar Pen Inject 25 Units into the skin 2 (two) times daily. (Patient taking differently: Inject 25 Units into the skin in the morning and at bedtime.) 15 mL 2  . insulin lispro (HUMALOG KWIKPEN) 100 UNIT/ML KwikPen Inject 6 Units into the skin 3 (three) times daily. (Patient taking differently: Inject 6 Units into the skin 2 (two) times daily.) 15 mL 1  . INSUPEN ULTRAFIN 31G X 8 MM MISC     . methocarbamol (ROBAXIN)  500 MG tablet Take 1 tablet (500 mg total) by mouth every 6 (six) hours as needed for muscle spasms.    . midodrine (PROAMATINE) 10 MG tablet Take 1 tablet (10 mg total) by mouth 3 (three) times daily with meals. (Patient taking differently: Take 10 mg by mouth daily.) 90 tablet 2  . potassium chloride (KLOR-CON) 10 MEQ tablet Take 1 tablet (10 mEq total) by mouth daily. 90 each 0  . potassium chloride SA (KLOR-CON) 20 MEQ tablet Take 1 tablet (20 mEq total) by mouth daily. 7 tablet 0  . prochlorperazine (COMPAZINE) 10 MG tablet Take 10 mg by mouth every 6 (six) hours as needed for nausea or vomiting.    Marland Kitchen RIVAROXABAN (XARELTO) VTE STARTER PACK (15 & 20 MG) Follow package directions: Take one 88m tablet by mouth twice a day. On day 22, switch to one 252mtablet once a day. Take with food. 51 each 0  . senna (SENOKOT) 8.6 MG TABS tablet Take 1 tablet (8.6 mg total) by mouth 2 (two) times daily. (Patient taking differently: Take 1 tablet by mouth every other day.) 120 tablet 0  . tamsulosin (FLOMAX) 0.4 MG CAPS capsule Take 0.4 mg by mouth daily.    . Marland Kitcheniotropium (SPIRIVA HANDIHALER) 18 MCG inhalation capsule Place 1 capsule (18 mcg total) into inhaler and inhale daily. 90 capsule 3  . Vitamin D3 (VITAMIN D) 25 MCG tablet Take 2 tablets (2,000 Units total) by mouth 2 (two) times daily. 120 tablet 0   No current facility-administered medications on file prior to visit.    No Known Allergies  Social History   Socioeconomic History  . Marital status: Single    Spouse name: Not on file  . Number of children: 1  . Years of education: 1448. Highest education level: Not on file  Occupational History  . Occupation: Disabled  Tobacco Use  . Smoking status: Former Smoker    Packs/day: 0.25    Years: 31.00    Pack years: 7.75    Types: Cigarettes  . Smokeless tobacco: Never Used  Vaping Use  . Vaping Use: Never used  Substance and Sexual Activity  . Alcohol use: Not Currently    Alcohol/week:  0.0 standard drinks  Comment: 09/21/2014 "might have a drink q 6 /months, if that"  . Drug use: Yes    Types: Cocaine    Comment: last use 07/06/2020  . Sexual activity: Yes  Other Topics Concern  . Not on file  Social History Narrative   Lives in [place] with his mother. No pets. Fun: movies, sports, daughter.    Denies religious beliefs that would effect health care.    Social Determinants of Health   Financial Resource Strain: Not on file  Food Insecurity: Not on file  Transportation Needs: Not on file  Physical Activity: Not on file  Stress: Not on file  Social Connections: Not on file  Intimate Partner Violence: Not on file            Family History  Problem Relation Age of Onset  . Diabetes Mother   . Heart disease Mother   . Diabetes Father   . Prostate cancer Father   . Heart disease Father   . Diabetes Brother   . Diabetes Brother     There were no vitals filed for this visit.   Wt Readings from Last 3 Encounters:  01/18/21 86.1 kg  01/16/21 84.8 kg  01/02/21 83.9 kg     PHYSICAL EXAM: General:  Chronically ill appearing, fail AAM in wheel chair on supp O2 but no visible No respiratory difficulty HEENT: normal Neck: supple. no JVD. Carotids 2+ bilat; no bruits. No lymphadenopathy or thyromegaly appreciated. Cor: PMI nondisplaced. Regular rate & rhythm. + 3/6 MR murmur at apex Lungs: clear Abdomen: soft, nontender, nondistended. No hepatosplenomegaly. No bruits or masses. Good bowel sounds. Extremities: no cyanosis, clubbing, rash, no edema on the right, + trace ankle edema on the left  Neuro: alert & oriented x 3, cranial nerves grossly intact. moves all 4 extremities w/o difficulty. Affect pleasant.   ASSESSMENT & PLAN: 1. Chronic Systolic Heart Failure-Mixed NICM/ICM .  - S/P St Jude Dual Chamber. ICD. - Echo 09/05/2018 LVEF 20-25%.  - Echo 11/02/2018: EF remains low at 20-25%  - Echo 07/2020 EF 25-30%  - CPX 12/2018. Moderate HF limitation.   - Echo 3/22 EF 15%, RV moderately reduced. Severe MR - Not a candidate for advanced therapies given metastatic lung cancer  - Orthostatic Hypotension has also limited medical therapy, currently only on loop diuretic, still on midodrine - NYHA Class III, confounded by lung cancer and COPD  - Device interrogated today. Impedence good. No volume overload - Continue Lasix 20 mg daily  - Continue midodrine 10 mg three times a day for BP support   2. Severe MR - noted on echo 3/22  - likely functional from severe LV dysfunction  - NYHA Class III  - continue diuretics for volume control  - prognosis is poor from lung cancer, no plans for MitraClip   3. Metastatic Lung Cancer w/ Brain Mets  - Received radiation + chemo - Unfortunately had recent f/u chest CT 3/22 showing interval enlargement of spiculated nodule c/w worsening primary malignancy + newly enlarged pretracheal and subcarinal lymph nodes, consistent with nodal metastatic disease.  - followed by Dr. Julien Nordmann. Just started another round of chemo 4/22   4. LLE DVT - newly diagnosed 3/22. Chest CT negative for PE - suspect due to hypercoagulable state from underlying malignancy - on Xarelto. Denies abnormal bleeding   5. CAD  - Coronary angiography (12/2008) with mid LAD totally occluded and collateralized - Stress test 09/21/18 with no ischemia and prior infarct.  - No  chest pain.  - Continue atorvastatin 20 mg daily.  - No ASA with stable CAD and use of Xarelto   6. DMII - Per PCP - Off jardiance   7.  Hx of embolic CVA - previously on coumadin, now on Xarelto  - No bleeding issues.   8. Polysubstance abuse  - Sober since 09/17/2015. Attends NA regularly and lives with his sponsor.   9. Left Femoral Fx - 1/22 after fall on ice - s/p IMN   10. COIVD 19 Infection  - 1/22    F/u in 2-3 months    Lyda Jester, PA-C  01/22/21

## 2021-01-22 NOTE — Patient Instructions (Addendum)
Labwork today We will call with any abnormal values Follow-up with Dr. Haroldine Laws 2-3 months --please see appt card. Potassium sent to [facility]

## 2021-01-22 NOTE — Telephone Encounter (Signed)
appt call reminder done No answer Detailed VM with appt instructions left on VM

## 2021-01-23 ENCOUNTER — Ambulatory Visit: Payer: Medicare Other

## 2021-01-23 ENCOUNTER — Inpatient Hospital Stay: Payer: Medicare Other

## 2021-01-23 LAB — CUP PACEART REMOTE DEVICE CHECK
Battery Remaining Longevity: 0 mo
Battery Voltage: 2.57 V
Brady Statistic RV Percent Paced: 0 %
Date Time Interrogation Session: 20220412144655
HighPow Impedance: 59 Ohm
HighPow Impedance: 59 Ohm
Implantable Lead Implant Date: 20110513
Implantable Lead Implant Date: 20110513
Implantable Lead Location: 753859
Implantable Lead Location: 753860
Implantable Pulse Generator Implant Date: 20110513
Lead Channel Impedance Value: 260 Ohm
Lead Channel Pacing Threshold Amplitude: 0.75 V
Lead Channel Pacing Threshold Pulse Width: 0.5 ms
Lead Channel Sensing Intrinsic Amplitude: 12 mV
Lead Channel Setting Pacing Amplitude: 2.5 V
Lead Channel Setting Pacing Pulse Width: 0.5 ms
Lead Channel Setting Sensing Sensitivity: 0.5 mV
Pulse Gen Serial Number: 608100

## 2021-01-30 ENCOUNTER — Inpatient Hospital Stay: Payer: Medicare Other

## 2021-01-31 NOTE — Progress Notes (Addendum)
Dennis [name]  Dennis Pee, MD [place] [address]  DIAGNOSIS: Extensive stage small cell lung cancer. He presented with a spiculated right middle lobe pulmonary nodule and mildly enlargedsolitary aortopulmonary lymph node. 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 He was diagnosed in March 2021.  PRIOR THERAPY: 1) Whole brain radiation under the care of Dr. Lisbeth [name]. Completed 01/17/2020 2)Systemic chemotherapy withcarboplatin for an AUC of 5 on day 1,etoposide 100 mg/m on days 1, 2, and 3, and Imfinzi 1500 mg on day 1 IV every 3 weeks. First dose expected on4/20/21. Status post 7 cycles. Starting from cycle #5 the patient will be treated with maintenance Imfinzi 1500 mg IV every 4 weeks. He has been off treatment for the last 6months.  CURRENT THERAPY: Resuming systemic chemotherapy with carboplatin for AUC of 5 on day 1, etoposide 100 mg/M2 on days 1, 2 and 3 with Cosela 240 mg/M2 before chemotherapy as well as Tecentriq 1200 mg IV every 3 weeks. First dose January 16, 2021. Status post 1 cycle.   INTERVAL HISTORY: Dennis [name]. 60 y.o. male returns to the clinic today for a follow-up visit.  The patient was last seen in clinic on 01/16/21. The patient had been off treatment for several months due to having COVID-19 and being in the hospital for femoral fracture.  Additionally, the patient has been having some hospital admissions for worsening congestive heart failure and receiving aggressive diuresis.  He is currently on 3 L of home oxygen.  He follows with Dr. Haroldine [name] from cardiology for his CHF. Due to being off treatment, the patient's most recent scans showed evidence for disease progression. Therefore, Dr. Julien [name] recommended that he consider palliative care/hospice vs resume treatment with chemotherapy/immunotherapy. The patient decided to resume treatment and  completed 1 cycle of treatment. He tolerated it fairly well without any concerning adverse side effects. He did not show up to his weekly lab appointments though.    In the interval since his last appointment, the patient has been following closely with cardiology. He also was evaluated by the registered dietician and is scheduled to follow up with them today.    Overall, he is feeling fairly well today. He is wondering if he can go back to work for financial reasons. He states he feels "ok" to go back to work. He is on 2-3 L of oxygen via nasal cannula. He denies fever, chills, or night sweats. He lost weight since his last appointment but this is likely due to diuresis because his lower extremity swelling is greatly improved compared to his last appointment. He states he has been "eating good". He denies any chest pain or hemoptysis. He has his baseline cough which may be a little worse lately but he states it is not bad enough to need to take anything. The cough is non-productive. He reports he continues to have labored breathing with exertion which is unchanged.  He denies any nausea, vomiting, or diarrhea. He reports constipation for which he takes sennakot.  He denies any headaches. He states he has some blurry vision and is planning on calling his eye doctor to make an appointment. He follows with neuro oncology and his next MRI of the brain is planned for early June 2022. The patient is here today for evaluation before starting cycle #2.   MEDICAL HISTORY: Past Medical History:  Diagnosis Date  . Alcohol abuse  with alcohol-induced mood disorder ([place]) 08/18/2015  . Automatic implantable cardioverter-defibrillator in situ 2012   S/P St Jude Dual Chamber. ICD.  Marland Kitchen CAD (coronary artery disease) 11/16/2014   CAD Coronary angiography (12/2008) with mid LAD totally occluded and collateralized.  . Cardiac LV ejection fraction 10-20%   . Chest pain 09/04/2018  . CHF (congestive heart failure) ([place])   .  Chronic systolic heart failure (HCC)    a) Mixed ICM/NICM b) RHC (05/2014): RA 2, RV 19/2/3, PA 22/14 (18), PCWP 6, Fick CO/CI: 5.2 / 2.7, PVR 2.3 WU, PA 60% and 64% c) ECHO (05/2014): EF 20-25%, diff HK, akinesis entireanteroseptal myocardium, triv AI, mod MR, LA mod/sev dilated  . Cocaine abuse ([place]) 01/24/2015  . Cocaine abuse with cocaine-induced mood disorder ([place]) 08/20/2015  . Drug abuse and dependence ([place])   . Dysphagia following cerebral infarction 01/24/2015  . Embolic stroke involving right middle cerebral artery ([place])   . Extensive stage primary small cell carcinoma of lung ([place]) 01/04/2020  . H/O noncompliance with medical treatment, presenting hazards to health 01/24/2015  . Heart murmur   . High cholesterol   . Ischemic cardiomyopathy    a) Coronary angiography (12/2008) at [facility]: Lmain: nl, LAD mid 100% stenosis with left to left and right-to-left collaterals to the distal LAD; Lcx: nl, RCA nl.    . 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[place])   . MDD (major depressive disorder), recurrent severe, without psychosis ([place]) 08/18/2015  . Open wound of foot 02/27/2015  . Pneumonia 1988  . Psychoactive substance-induced mood disorder ([place]) 07/17/2015  . Sleep apnea    "cleared after T&A"  . Status post PICC central line placement   . Stroke ([place])   . Substance induced mood disorder ([place]) 08/17/2015  . Type II diabetes mellitus (HCC)     ALLERGIES:  has No Known Allergies.  MEDICATIONS:  Current Outpatient Medications  Medication Sig Dispense Refill  . Accu-Chek Softclix Lancets lancets     . atorvastatin (LIPITOR) 20 MG tablet TAKE 1 TABLET BY MOUTH DAILY AT 6 PM. 30 tablet 6  . Blood Glucose Monitoring Suppl (FIFTY50 GLUCOSE METER 2.0) w/Device KIT Use as instructed    . budesonide-formoterol (SYMBICORT) 160-4.5 MCG/ACT inhaler Inhale 2 puffs into the lungs 2 (two) times daily. 1 each 12  . dexamethasone (DECADRON) 1 MG tablet Take 1 tablet (1 mg total) by mouth daily. 60 tablet 2  .  feeding supplement (ENSURE ENLIVE / ENSURE PLUS) LIQD Take 237 mLs by mouth 2 (two) times daily between meals. 237 mL 0  . furosemide (LASIX) 20 MG tablet Take 20 mg by mouth daily.    . insulin glargine (LANTUS SOLOSTAR) 100 UNIT/ML Solostar Pen Inject 25 Units into the skin 2 (two) times daily. (Patient taking differently: Inject 25 Units into the skin in the morning and at bedtime.) 15 mL 2  . insulin lispro (HUMALOG KWIKPEN) 100 UNIT/ML KwikPen Inject 6 Units into the skin 3 (three) times daily. (Patient taking differently: Inject 6 Units into the skin 2 (two) times daily.) 15 mL 1  . INSUPEN ULTRAFIN 31G X 8 MM MISC     . methocarbamol (ROBAXIN) 500 MG tablet Take 1 tablet (500 mg total) by mouth every 6 (six) hours as needed for muscle spasms.    . midodrine (PROAMATINE) 10 MG tablet Take 1 tablet (10 mg total) by mouth 3 (three) times daily with meals. (Patient taking differently: Take 10 mg by mouth daily.) 90 tablet 2  . potassium chloride  SA (KLOR-CON) 20 MEQ tablet Take 1 tablet (20 mEq total) by mouth daily. 30 tablet 5  . prochlorperazine (COMPAZINE) 10 MG tablet Take 10 mg by mouth every 6 (six) hours as needed for nausea or vomiting.    . rivaroxaban (XARELTO) 20 MG TABS tablet Take 20 mg by mouth daily with supper.    . senna (SENOKOT) 8.6 MG TABS tablet Take 1 tablet (8.6 mg total) by mouth 2 (two) times daily. (Patient taking differently: Take 1 tablet by mouth every other day.) 120 tablet 0  . tamsulosin (FLOMAX) 0.4 MG CAPS capsule Take 0.4 mg by mouth daily.    Marland Kitchen tiotropium (SPIRIVA HANDIHALER) 18 MCG inhalation capsule Place 1 capsule (18 mcg total) into inhaler and inhale daily. 90 capsule 3  . Vitamin D3 (VITAMIN D) 25 MCG tablet Take 2 tablets (2,000 Units total) by mouth 2 (two) times daily. 120 tablet 0   No current facility-administered medications for this visit.   Facility-Administered Medications Ordered in Other Visits  Medication Dose Route Frequency Provider Last  Rate Last Admin  . sodium chloride flush (NS) 0.9 % injection 10 mL  10 mL Intracatheter Once Curt Bears, MD        SURGICAL HISTORY:  Past Surgical History:  Procedure Laterality Date  . BRONCHIAL BRUSHINGS  12/28/2019   Procedure: BRONCHIAL BRUSHINGS;  Surgeon: Garner Nash, DO;  Location: [facility];  Service: Thoracic;;  . BRONCHIAL NEEDLE ASPIRATION BIOPSY  12/28/2019   Procedure: BRONCHIAL NEEDLE ASPIRATION BIOPSIES;  Surgeon: Garner Nash, DO;  Location: [facility];  Service: Thoracic;;  . BRONCHIAL WASHINGS  12/28/2019   Procedure: BRONCHIAL WASHINGS;  Surgeon: Garner Nash, DO;  Location: [facility];  Service: Thoracic;;  . CARDIAC CATHETERIZATION  05/2014  . CARDIAC DEFIBRILLATOR PLACEMENT  03/2011  . CHOLECYSTECTOMY  1994  . FEMUR IM NAIL Left 10/31/2020   Procedure: INTRAMEDULLARY (IM) RETROGRADE FEMORAL NAILING;  Surgeon: Shona Needles, MD;  Location: [facility];  Service: Orthopedics;  Laterality: Left;  . FOREARM FRACTURE SURGERY Left 1980  . FRACTURE SURGERY    . IR IMAGING GUIDED PORT INSERTION  10/11/2020  . RIGHT HEART CATHETERIZATION N/A 05/30/2014   Procedure: RIGHT HEART CATH;  Surgeon: Jolaine Artist, MD;  [facility] C[facility] LAB;  Service: Cardiovascular;  Laterality: N/A;  . RIGHT HEART CATHETERIZATION N/A 02/07/2015   Procedure: RIGHT HEART CATH;  Surgeon: Jolaine Artist, MD;  Location[facility] CATH LAB;  Service: Cardiovascular;  Laterality: N/A;  . TONSILLECTOMY AND ADENOIDECTOMY  ~ 1998  . VIDEO BRONCHOSCOPY WITH ENDOBRONCHIAL ULTRASOUND N/A 12/28/2019   Procedure: VIDEO BRONCHOSCOPY WITH ENDOBRONCHIAL ULTRASOUND;  Surgeon: Garner Nash, DO;  Location: [facility];  Service: Thoracic;  Laterality: N/A;    REVIEW OF SYSTEMS:   Review of Systems  Constitutional: Positive for fatigue and weight loss. Negative for chills and fever. HENT: Negative for mouth sores, nosebleeds, sore throat and trouble swallowing.   Eyes: Negative for eye  problems and icterus.  Respiratory: Positive for cough and shortness of breath. Negative for hemoptysis and wheezing.   Cardiovascular: Positive for leg swelling (greatly improved). Negative for chest pain. Gastrointestinal: Positive for constipation (none at this time). Negative for abdominal pain, diarrhea, nausea and vomiting.  Genitourinary: Negative for bladder incontinence, difficulty urinating, dysuria, frequency and hematuria.   Musculoskeletal: Negative for back pain, gait problem, neck pain and neck stiffness.  Skin: Negative for itching and rash.  Neurological: Negative for dizziness, extremity weakness, gait problem, headaches, light-headedness  and seizures.  Hematological: Negative for adenopathy. Does not bruise/bleed easily.  Psychiatric/Behavioral: Negative for confusion, depression and sleep disturbance. The patient is not nervous/anxious.    PHYSICAL EXAMINATION:  Blood pressure 108/80, pulse 100, temperature 97.8 F (36.6 C), temperature source Tympanic, resp. rate 18, height 6' (1.829 m), weight 175 lb 6.4 oz (79.6 kg), SpO2 100 %.  ECOG PERFORMANCE STATUS: 2 - Symptomatic, <50% confined to bed  Physical Exam  Constitutional: Oriented to person, place, and time and chronically ill appearing male and in no distress.  HENT:  Head: Normocephalic and atraumatic.  Mouth/Throat: Oropharynx is clear and moist. No oropharyngeal exudate.  Eyes: Conjunctivae are normal. Right eye exhibits no discharge. Left eye exhibits no discharge. No scleral icterus.  Neck: Normal range of motion. Neck supple.  Cardiovascular: Normal rate, regular rhythm, normal heart sounds and intact distal pulses.   Pulmonary/Chest: Effort normal and breath sounds normal. No respiratory distress. No wheezes. No rales. On oxygen via nasal cannula 3L. Abdominal: Soft. Bowel sounds are normal. Exhibits no distension and no mass. There is no tenderness.  Musculoskeletal: Positive for significantly improved  bilateral leg swelling. Normal range of motion.  Lymphadenopathy:    No cervical adenopathy.  Neurological: Alert and oriented to person, place, and time. Exhibits muscle wasting. Examined in the wheelchair.  Skin: Skin is warm and dry. No rash noted. Not diaphoretic. No erythema. No pallor.  Psychiatric: Mood, memory and judgment normal.  Vitals reviewed.  LABORATORY DATA: Lab Results  Component Value Date   WBC 10.1 01/16/2021   HGB 11.8 (L) 01/16/2021   HCT 38.1 (L) 01/16/2021   MCV 95.3 01/16/2021   PLT 244 01/16/2021      Chemistry      Component Value Date/Time   NA 133 (L) 01/22/2021 1513   K 4.3 01/22/2021 1513   CL 95 (L) 01/22/2021 1513   CO2 33 (H) 01/22/2021 1513   BUN 28 (H) 01/22/2021 1513   CREATININE 1.70 (H) 01/22/2021 1513   CREATININE 1.43 (H) 01/16/2021 0834      Component Value Date/Time   CALCIUM 8.7 (L) 01/22/2021 1513   ALKPHOS 157 (H) 01/16/2021 0834   AST 30 01/16/2021 0834   ALT 40 01/16/2021 0834   BILITOT 0.6 01/16/2021 0834       RADIOGRAPHIC STUDIES:  CUP PACEART REMOTE DEVICE CHECK  Result Date: 01/23/2021 Alert for tachy therapy disabled. Per pacerart [name] dated 11/14/20, tachy therapies disabled by industry via order from Dr. Lovena Le.  No plans for gen change confirmed with industry rep, tachy therapies were disabled at request of Dr. Lovena Le, via device clinic RN as noted in [place].  Also alert for ERI battery status as of 08/17/2020 - known.  Alert for atrial lead noise revision - device programmed VVI 40.  Considering above routing to triage for consideration of need for continue remote monitoring.  R. Powers, CVRS    ASSESSMENT/PLAN:  This is a very pleasant 60 year old African-American male diagnosed with extensive stage small cell lung cancer. Hepresented with spiculated right middle lobe pulmonary nodule in addition to mediastinal lymphadenopathy and multiple metastatic brain lesions. The patient is status post whole brain  irradiation.  The patient underwent systemic chemotherapy with carboplatin, etoposide, and Imfinzi.  He was status post 8 cycles of treatment.  Starting from cycle #5 the patient was on maintenance treatment with immunotherapy with Imfinzi every 4 weeks.  The patient had several prolonged hospitalizations over the last few months due to COVID-19 as  well as fracture of the left femur.  The patient was also hospitalized due to congestive heart failure.  Since being off treatment for several months, the patient's restaging scan showed evidence of disease progression with an interval enlargement of the spiculated nodule in the posterior lateral segment of the right middle lobe as well as new enlarged pretracheal and subcarinal lymphadenopathy consistent with nodal metastatic disease.   The patient was offered referral to palliative care/hospice versus resuming treatment with carboplatin, etoposide, and Imfinzi.  The adverse side effects of treatment were discussed including but not limited to risk for infection and even death.  The patient opted to resume treatment.   He is currently undergoing treatment with carboplatin for an AUC of 5, Etoposide 100 mg/m2, and tecentriq. He also will be on Cosela for myeloprotection. He is status post 1 cycle and tolerated it well.   Labs were reviewed. We will treatment him with cycle #2 today. Reinforced that the patient needs weekly labs. I gave him a copy of his scheduled. We will see him back for a follow up visit in 3 weeks for evaluation before starting cycle #3  I will arrange for a restaging CT scan of the chest, abdomen, and pelvis prior to starting his next cycle of treatment. The patient reported a mild dry cough. Discussed he can use delsym if needed and we will evaluate his lungs on his upcoming imaging study.   He will continue on Xarelto for the DVT.  He wants to go back to work. I discussed this is a personal decision. I think it is reasonable given  his poor prognosis to not go back to work if able. I will refer him to a social worker to see if he can apply for any type of programs.   He will continue to follow with cardiology regarding his CHF.   He will meet with the registered dietitian today. He lost weight but I believe a significant portion of that is fluid weight due to his diuretics and improved swelling.   The patient was advised to call immediately if he has any concerning symptoms in the interval. The patient voices understanding of current disease status and treatment options and is in agreement with the current care plan. All questions were answered. The patient knows to call the clinic with any problems, questions or concerns. We can certainly see the patient much sooner if necessary          Orders Placed This Encounter  Procedures  . CT Chest W Contrast    Standing Status:   Future    Standing Expiration Date:   02/05/2022    Order Specific Question:   If indicated for the ordered procedure, I authorize the administration of contrast media per Radiology protocol    Answer:   Yes    Order Specific Question:   Preferred imaging location?    Answer:   [facility]  . CT Abdomen Pelvis W Contrast    Standing Status:   Future    Standing Expiration Date:   02/05/2022    Order Specific Question:   If indicated for the ordered procedure, I authorize the administration of contrast media per Radiology protocol    Answer:   Yes    Order Specific Question:   Preferred imaging location?    Answer:   [facility]    Order Specific Question:   Is Oral Contrast requested for this exam?    Answer:   Yes,  Per Radiology protocol  . Ambulatory referral to Social Work    Referral Priority:   Routine    Referral Type:   Consultation    Referral Reason:   Specialty Services Required    Number of Visits Requested:   1     I spent 20-29 minutes in this encounter.   Dennis Pesce L Nilah Belcourt, PA-C 02/05/21

## 2021-02-05 ENCOUNTER — Other Ambulatory Visit: Payer: Self-pay

## 2021-02-05 ENCOUNTER — Encounter: Payer: Self-pay | Admitting: *Deleted

## 2021-02-05 ENCOUNTER — Inpatient Hospital Stay: Payer: Medicare Other

## 2021-02-05 ENCOUNTER — Inpatient Hospital Stay: Payer: Medicare Other | Admitting: Physician Assistant

## 2021-02-05 ENCOUNTER — Inpatient Hospital Stay: Payer: Medicare Other | Admitting: Nutrition

## 2021-02-05 ENCOUNTER — Encounter: Payer: Self-pay | Admitting: Physician Assistant

## 2021-02-05 VITALS — BP 108/80 | HR 100 | Temp 97.8°F | Resp 18 | Ht 72.0 in | Wt 175.4 lb

## 2021-02-05 DIAGNOSIS — Z5112 Encounter for antineoplastic immunotherapy: Secondary | ICD-10-CM | POA: Diagnosis not present

## 2021-02-05 DIAGNOSIS — C3490 Malignant neoplasm of unspecified part of unspecified bronchus or lung: Secondary | ICD-10-CM

## 2021-02-05 DIAGNOSIS — Z95828 Presence of other vascular implants and grafts: Secondary | ICD-10-CM | POA: Insufficient documentation

## 2021-02-05 DIAGNOSIS — C3411 Malignant neoplasm of upper lobe, right bronchus or lung: Secondary | ICD-10-CM | POA: Diagnosis not present

## 2021-02-05 LAB — CMP (CANCER CENTER ONLY)
ALT: 23 U/L (ref 0–44)
AST: 19 U/L (ref 15–41)
Albumin: 2.6 g/dL — ABNORMAL LOW (ref 3.5–5.0)
Alkaline Phosphatase: 153 U/L — ABNORMAL HIGH (ref 38–126)
Anion gap: 11 (ref 5–15)
BUN: 23 mg/dL — ABNORMAL HIGH (ref 6–20)
CO2: 27 mmol/L (ref 22–32)
Calcium: 8.6 mg/dL — ABNORMAL LOW (ref 8.9–10.3)
Chloride: 101 mmol/L (ref 98–111)
Creatinine: 1.53 mg/dL — ABNORMAL HIGH (ref 0.61–1.24)
GFR, Estimated: 52 mL/min — ABNORMAL LOW (ref >60)
Glucose, Bld: 286 mg/dL — ABNORMAL HIGH (ref 70–99)
Potassium: 4.2 mmol/L (ref 3.5–5.1)
Sodium: 139 mmol/L (ref 135–145)
Total Bilirubin: 0.5 mg/dL (ref 0.3–1.2)
Total Protein: 6.3 g/dL — ABNORMAL LOW (ref 6.5–8.1)

## 2021-02-05 LAB — CBC WITH DIFFERENTIAL (CANCER CENTER ONLY)
Abs Immature Granulocytes: 0.15 10*3/uL — ABNORMAL HIGH (ref 0.00–0.07)
Basophils Absolute: 0 10*3/uL (ref 0.0–0.1)
Basophils Relative: 1 %
Eosinophils Absolute: 0 10*3/uL (ref 0.0–0.5)
Eosinophils Relative: 1 %
HCT: 35 % — ABNORMAL LOW (ref 39.0–52.0)
Hemoglobin: 10.9 g/dL — ABNORMAL LOW (ref 13.0–17.0)
Immature Granulocytes: 3 %
Lymphocytes Relative: 23 %
Lymphs Abs: 1.4 10*3/uL (ref 0.7–4.0)
MCH: 29.1 pg (ref 26.0–34.0)
MCHC: 31.1 g/dL (ref 30.0–36.0)
MCV: 93.3 fL (ref 80.0–100.0)
Monocytes Absolute: 1 10*3/uL (ref 0.1–1.0)
Monocytes Relative: 17 %
Neutro Abs: 3.4 10*3/uL (ref 1.7–7.7)
Neutrophils Relative %: 55 %
Platelet Count: 203 10*3/uL (ref 150–400)
RBC: 3.75 MIL/uL — ABNORMAL LOW (ref 4.22–5.81)
RDW: 18 % — ABNORMAL HIGH (ref 11.5–15.5)
WBC Count: 6 10*3/uL (ref 4.0–10.5)
nRBC: 1.7 % — ABNORMAL HIGH (ref 0.0–0.2)

## 2021-02-05 MED ORDER — SODIUM CHLORIDE 0.9 % IV SOLN
10.0000 mg | Freq: Once | INTRAVENOUS | Status: AC
  Administered 2021-02-05: 10 mg via INTRAVENOUS
  Filled 2021-02-05: qty 10

## 2021-02-05 MED ORDER — PALONOSETRON HCL INJECTION 0.25 MG/5ML
0.2500 mg | Freq: Once | INTRAVENOUS | Status: AC
  Administered 2021-02-05: 0.25 mg via INTRAVENOUS

## 2021-02-05 MED ORDER — PALONOSETRON HCL INJECTION 0.25 MG/5ML
INTRAVENOUS | Status: AC
  Filled 2021-02-05: qty 5

## 2021-02-05 MED ORDER — SODIUM CHLORIDE 0.9 % IV SOLN
1200.0000 mg | Freq: Once | INTRAVENOUS | Status: AC
  Administered 2021-02-05: 1200 mg via INTRAVENOUS
  Filled 2021-02-05: qty 20

## 2021-02-05 MED ORDER — SODIUM CHLORIDE 0.9 % IV SOLN
Freq: Once | INTRAVENOUS | Status: AC
  Filled 2021-02-05: qty 250

## 2021-02-05 MED ORDER — SODIUM CHLORIDE 0.9% FLUSH
10.0000 mL | INTRAVENOUS | Status: DC | PRN
  Filled 2021-02-05: qty 10

## 2021-02-05 MED ORDER — TRILACICLIB DIHYDROCHLORIDE INJECTION 300 MG
240.0000 mg/m2 | Freq: Once | INTRAVENOUS | Status: AC
  Administered 2021-02-05: 495 mg via INTRAVENOUS
  Filled 2021-02-05: qty 33

## 2021-02-05 MED ORDER — SODIUM CHLORIDE 0.9 % IV SOLN
95.0000 mg/m2 | Freq: Once | INTRAVENOUS | Status: AC
  Administered 2021-02-05: 200 mg via INTRAVENOUS
  Filled 2021-02-05: qty 10

## 2021-02-05 MED ORDER — SODIUM CHLORIDE 0.9 % IV SOLN
150.0000 mg | Freq: Once | INTRAVENOUS | Status: AC
  Administered 2021-02-05: 150 mg via INTRAVENOUS
  Filled 2021-02-05: qty 150

## 2021-02-05 MED ORDER — SODIUM CHLORIDE 0.9% FLUSH
10.0000 mL | Freq: Once | INTRAVENOUS | Status: AC
  Administered 2021-02-05: 10 mL
  Filled 2021-02-05: qty 10

## 2021-02-05 MED ORDER — HEPARIN SOD (PORK) LOCK FLUSH 100 UNIT/ML IV SOLN
500.0000 [IU] | Freq: Once | INTRAVENOUS | Status: DC | PRN
  Filled 2021-02-05: qty 5

## 2021-02-05 MED ORDER — SODIUM CHLORIDE 0.9 % IV SOLN
406.0000 mg | Freq: Once | INTRAVENOUS | Status: AC
  Administered 2021-02-05: 410 mg via INTRAVENOUS
  Filled 2021-02-05: qty 41

## 2021-02-05 NOTE — Progress Notes (Signed)
Per Cassie Heilingoetter, PA okay to treat with SCr 1.53.

## 2021-02-05 NOTE — Progress Notes (Signed)
Nutrition follow-up completed with patient during infusion for extensive stage small cell lung cancer. Weight documented as 175.4 pounds April 26 decreased from 187 pounds April 6. Noted labs glucose 286, BUN 23, creatinine 1.53, and albumin 2.6. Patient denies nutrition impact symptoms. Reports he is eating well and his appetite is good. Noted decreased edema.  Patient is taking Lasix resulting in diuresis. Patient reports he is taking stool softener to avoid constipation. Reports fluctuating blood sugars but states he is taking medications as prescribed.  Nutrition diagnosis: Food and nutrition related knowledge deficit improved.  Intervention: Continue small frequent meals and snacks with high-calorie, high-protein foods. Continue [employer] 1.2 glucose support between meals as desired.  Patient can also consume Glucerna if preferred. Continue bowel regimen. Education provided on importance of high-protein, low-sodium diet.  Monitoring, evaluation, goals: Patient will tolerate adequate calories and protein to maintain lean body mass.  Next visit: Tuesday, May 17 during infusion.  **Disclaimer: This note was dictated with voice recognition software. Similar sounding words can inadvertently be transcribed and this note may contain transcription errors which may not have been corrected upon publication of note.**

## 2021-02-05 NOTE — Progress Notes (Signed)
[place] Work  Clinical Social Work received referral from medical oncology for financial needs and resources.  CSW met with patient in the infusion room to offer support and assess for needs.  Patient stated he was currently receiving disability and was working prior to his diagnosis.  Patient has experienced additional stress due to being unable to work for the last 3 months.  Patient plans to contact his employer to discuss returning to work.  CSW and patient discussed available resources including the [place] gas card program.  CSW and patient reviewed application for [employer] and submitted.  CSw also enrolled patient in the [facility] and provided first distribution.  Patient was appreciative of CSW contact and did not identify any additional needs at this time.  CSW provided contact information and encouraged patient to call with questions or concerns.   Johnnye Lana, MSW, LCSW, OSW-C Clinical Social Worker [facility] [phone]

## 2021-02-05 NOTE — Patient Instructions (Signed)
[facility] ONCOLOGY  Discharge Instructions: Thank you for choosing [facility] to provide your oncology and hematology care.   If you have a lab appointment with the [facility], please go directly to the [facility] and check in at the registration area.   Wear comfortable clothing and clothing appropriate for easy access to any Portacath or PICC line.   We strive to give you quality time with your provider. You may need to reschedule your appointment if you arrive late (15 or more minutes).  Arriving late affects you and other patients whose appointments are after yours.  Also, if you miss three or more appointments without notifying the office, you may be dismissed from the clinic at the provider's discretion.      For prescription refill requests, have your pharmacy contact our office and allow 72 hours for refills to be completed.    Today you received the following chemotherapy and/or immunotherapy agents: Tecentriq/Etoposide/Carboplatin.    To help prevent nausea and vomiting after your treatment, we encourage you to take your nausea medication as directed.  BELOW ARE SYMPTOMS THAT SHOULD BE REPORTED IMMEDIATELY: . *FEVER GREATER THAN 100.4 F (38 C) OR HIGHER . *CHILLS OR SWEATING . *NAUSEA AND VOMITING THAT IS NOT CONTROLLED WITH YOUR NAUSEA MEDICATION . *UNUSUAL SHORTNESS OF BREATH . *UNUSUAL BRUISING OR BLEEDING . *URINARY PROBLEMS (pain or burning when urinating, or frequent urination) . *BOWEL PROBLEMS (unusual diarrhea, constipation, pain near the anus) . TENDERNESS IN MOUTH AND THROAT WITH OR WITHOUT PRESENCE OF ULCERS (sore throat, sores in mouth, or a toothache) . UNUSUAL RASH, SWELLING OR PAIN  . UNUSUAL VAGINAL DISCHARGE OR ITCHING   Items with * indicate a potential emergency and should be followed up as soon as possible or go to the Emergency Department if any problems should occur.  Please show the CHEMOTHERAPY ALERT CARD or  IMMUNOTHERAPY ALERT CARD at check-in to the Emergency Department and triage nurse.  Should you have questions after your visit or need to cancel or reschedule your appointment, please contact [facility]  Dept: [phone]  and follow the prompts.  Office hours are 8:00 a.m. to 4:30 p.m. Monday - Friday. Please note that voicemails left after 4:00 p.m. may not be returned until the following business day.  We are closed weekends and major holidays. You have access to a nurse at all times for urgent questions. Please call the main number to the clinic Dept: [phone] and follow the prompts.   For any non-urgent questions, you may also contact your provider using MyChart. We now offer e-Visits for anyone 16 and older to request care online for non-urgent symptoms. For details visit [email or web address].   Also download the MyChart app! Go to the app store, search "MyChart", open the app, select [facility], and log in with your MyChart username and password.  Due to Covid, a mask is required upon entering the hospital/clinic. If you do not have a mask, one will be given to you upon arrival. For doctor visits, patients may have 1 support person aged 34 or older with them. For treatment visits, patients cannot have anyone with them due to current Covid guidelines and our immunocompromised population.

## 2021-02-06 ENCOUNTER — Inpatient Hospital Stay: Payer: Medicare Other

## 2021-02-06 ENCOUNTER — Other Ambulatory Visit: Payer: Medicare Other | Admitting: General Practice

## 2021-02-06 VITALS — BP 104/86 | HR 103 | Temp 98.0°F | Resp 18

## 2021-02-06 DIAGNOSIS — Z5112 Encounter for antineoplastic immunotherapy: Secondary | ICD-10-CM | POA: Diagnosis not present

## 2021-02-06 DIAGNOSIS — C3411 Malignant neoplasm of upper lobe, right bronchus or lung: Secondary | ICD-10-CM

## 2021-02-06 MED ORDER — SODIUM CHLORIDE 0.9 % IV SOLN
Freq: Once | INTRAVENOUS | Status: AC
  Filled 2021-02-06: qty 250

## 2021-02-06 MED ORDER — SODIUM CHLORIDE 0.9 % IV SOLN
95.0000 mg/m2 | Freq: Once | INTRAVENOUS | Status: AC
  Administered 2021-02-06: 200 mg via INTRAVENOUS
  Filled 2021-02-06: qty 10

## 2021-02-06 MED ORDER — TRILACICLIB DIHYDROCHLORIDE INJECTION 300 MG
240.0000 mg/m2 | Freq: Once | INTRAVENOUS | Status: AC
  Administered 2021-02-06: 495 mg via INTRAVENOUS
  Filled 2021-02-06: qty 33

## 2021-02-06 MED ORDER — SODIUM CHLORIDE 0.9 % IV SOLN
10.0000 mg | Freq: Once | INTRAVENOUS | Status: AC
  Administered 2021-02-06: 10 mg via INTRAVENOUS
  Filled 2021-02-06: qty 10

## 2021-02-06 MED ORDER — SODIUM CHLORIDE 0.9% FLUSH
10.0000 mL | INTRAVENOUS | Status: DC | PRN
  Administered 2021-02-06: 10 mL
  Filled 2021-02-06: qty 10

## 2021-02-06 MED ORDER — HEPARIN SOD (PORK) LOCK FLUSH 100 UNIT/ML IV SOLN
500.0000 [IU] | Freq: Once | INTRAVENOUS | Status: AC | PRN
  Administered 2021-02-06: 500 [IU]
  Filled 2021-02-06: qty 5

## 2021-02-06 NOTE — Patient Instructions (Signed)
[facility]  Discharge Instructions: Thank you for choosing [facility] to provide your oncology and hematology care.   If you have a lab appointment with the [facility], please go directly to the [facility] and check in at the registration area.   Wear comfortable clothing and clothing appropriate for easy access to any Portacath or PICC line.   We strive to give you quality time with your provider. You may need to reschedule your appointment if you arrive late (15 or more minutes).  Arriving late affects you and other patients whose appointments are after yours.  Also, if you miss three or more appointments without notifying the office, you may be dismissed from the clinic at the provider's discretion.      For prescription refill requests, have your pharmacy contact our office and allow 72 hours for refills to be completed.    Today you received the following chemotherapy and/or immunotherapy agents Etoposide.    To help prevent nausea and vomiting after your treatment, we encourage you to take your nausea medication as directed.  BELOW ARE SYMPTOMS THAT SHOULD BE REPORTED IMMEDIATELY: . *FEVER GREATER THAN 100.4 F (38 C) OR HIGHER . *CHILLS OR SWEATING . *NAUSEA AND VOMITING THAT IS NOT CONTROLLED WITH YOUR NAUSEA MEDICATION . *UNUSUAL SHORTNESS OF BREATH . *UNUSUAL BRUISING OR BLEEDING . *URINARY PROBLEMS (pain or burning when urinating, or frequent urination) . *BOWEL PROBLEMS (unusual diarrhea, constipation, pain near the anus) . TENDERNESS IN MOUTH AND THROAT WITH OR WITHOUT PRESENCE OF ULCERS (sore throat, sores in mouth, or a toothache) . UNUSUAL RASH, SWELLING OR PAIN  . UNUSUAL VAGINAL DISCHARGE OR ITCHING   Items with * indicate a potential emergency and should be followed up as soon as possible or go to the Emergency Department if any problems should occur.  Please show the CHEMOTHERAPY ALERT CARD or IMMUNOTHERAPY ALERT  CARD at check-in to the Emergency Department and triage nurse.  Should you have questions after your visit or need to cancel or reschedule your appointment, please contact [facility]  Dept: [phone]  and follow the prompts.  Office hours are 8:00 a.m. to 4:30 p.m. Monday - Friday. Please note that voicemails left after 4:00 p.m. may not be returned until the following business day.  We are closed weekends and major holidays. You have access to a nurse at all times for urgent questions. Please call the main number to the clinic Dept: [phone] and follow the prompts.   For any non-urgent questions, you may also contact your provider using MyChart. We now offer e-Visits for anyone 59 and older to request care online for non-urgent symptoms. For details visit [email or web address].   Also download the MyChart app! Go to the app store, search "MyChart", open the app, select [facility], and log in with your MyChart username and password.  Due to Covid, a mask is required upon entering the hospital/clinic. If you do not have a mask, one will be given to you upon arrival. For doctor visits, patients may have 1 support person aged 59 or older with them. For treatment visits, patients cannot have anyone with them due to current Covid guidelines and our immunocompromised population.

## 2021-02-06 NOTE — Progress Notes (Signed)
Per Dr. Julien Nordmann, ok to treat with HR 103.

## 2021-02-07 ENCOUNTER — Other Ambulatory Visit: Payer: Self-pay

## 2021-02-07 ENCOUNTER — Inpatient Hospital Stay: Payer: Medicare Other

## 2021-02-07 VITALS — BP 99/75 | HR 98 | Temp 97.8°F | Resp 16

## 2021-02-07 DIAGNOSIS — C3411 Malignant neoplasm of upper lobe, right bronchus or lung: Secondary | ICD-10-CM

## 2021-02-07 DIAGNOSIS — Z5112 Encounter for antineoplastic immunotherapy: Secondary | ICD-10-CM | POA: Diagnosis not present

## 2021-02-07 MED ORDER — HEPARIN SOD (PORK) LOCK FLUSH 100 UNIT/ML IV SOLN
500.0000 [IU] | Freq: Once | INTRAVENOUS | Status: AC | PRN
  Administered 2021-02-07: 500 [IU]
  Filled 2021-02-07: qty 5

## 2021-02-07 MED ORDER — SODIUM CHLORIDE 0.9 % IV SOLN
Freq: Once | INTRAVENOUS | Status: AC
  Filled 2021-02-07: qty 250

## 2021-02-07 MED ORDER — TRILACICLIB DIHYDROCHLORIDE INJECTION 300 MG
240.0000 mg/m2 | Freq: Once | INTRAVENOUS | Status: AC
  Administered 2021-02-07: 495 mg via INTRAVENOUS
  Filled 2021-02-07: qty 33

## 2021-02-07 MED ORDER — SODIUM CHLORIDE 0.9 % IV SOLN
95.0000 mg/m2 | Freq: Once | INTRAVENOUS | Status: AC
  Administered 2021-02-07: 200 mg via INTRAVENOUS
  Filled 2021-02-07: qty 10

## 2021-02-07 MED ORDER — SODIUM CHLORIDE 0.9% FLUSH
10.0000 mL | INTRAVENOUS | Status: DC | PRN
  Administered 2021-02-07: 10 mL
  Filled 2021-02-07: qty 10

## 2021-02-07 MED ORDER — SODIUM CHLORIDE 0.9 % IV SOLN
10.0000 mg | Freq: Once | INTRAVENOUS | Status: AC
  Administered 2021-02-07: 10 mg via INTRAVENOUS
  Filled 2021-02-07: qty 10

## 2021-02-07 NOTE — Patient Instructions (Signed)
[facility]  Discharge Instructions: [facility] you for choosing [facility] to provide your [facility]ology and hematology care.   If you have a lab appointment with the [facility], please go directly to the [facility] and check in at the registration area.   Wear comfortable clothing and clothing appropriate for easy access to any Portacath or PICC line.   We strive to give you quality time with your provider. You may need to reschedule your appointment if you arrive late (15 or more minutes).  Arriving late affects you and other patients whose appointments are after yours.  Also, if you miss three or more appointments without notifying the office, you may be dismissed from the clinic at the provider's discretion.      For prescription refill requests, have your pharmacy contact our office and allow 72 hours for refills to be completed.    Today you received the following chemotherapy and/or immunotherapy agents Etoposide.    To help prevent nausea and vomiting after your treatment, we encourage you to take your nausea medication as directed.  BELOW ARE SYMPTOMS THAT SHOULD BE REPORTED IMMEDIATELY: . *FEVER GREATER THAN 100.4 F (38 C) OR HIGHER . *CHILLS OR SWEATING . *NAUSEA AND VOMITING THAT IS NOT CONTROLLED WITH YOUR NAUSEA MEDICATION . *UNUSUAL SHORTNESS OF BREATH . *UNUSUAL BRUISING OR BLEEDING . *URINARY PROBLEMS (pain or burning when urinating, or frequent urination) . *BOWEL PROBLEMS (unusual diarrhea, constipation, pain near the anus) . TENDERNESS IN MOUTH AND THROAT WITH OR WITHOUT PRESENCE OF ULCERS (sore throat, sores in mouth, or a toothache) . UNUSUAL RASH, SWELLING OR PAIN  . UNUSUAL VAGINAL DISCHARGE OR ITCHING   Items with * indicate a potential emergency and should be followed up as soon as possible or go to the Emergency Department if any problems should occur.  Please show the CHEMOTHERAPY ALERT CARD or IMMUNOTHERAPY ALERT  CARD at check-in to the Emergency Department and triage nurse.  Should you have questions after your visit or need to cancel or reschedule your appointment, please contact [facility]  Dept: [phone]  and follow the prompts.  Office hours are 8:00 a.m. to 4:30 p.m. Monday - Friday. Please note that voicemails left after 4:00 p.m. may not be returned until the following business day.  We are closed weekends and major holidays. You have access to a nurse at all times for urgent questions. Please call the main number to the clinic Dept: [phone] and follow the prompts.   For any non-urgent questions, you may also contact your provider using MyChart. We now offer e-Visits for anyone 59 and older to request care online for non-urgent symptoms. For details visit [email or web address].   Also download the MyChart app! Go to the app store, search "MyChart", open the app, select [facility], and log in with your MyChart username and password.  Due to Covid, a mask is required upon entering the hospital/clinic. If you do not have a mask, one will be given to you upon arrival. For doctor visits, patients may have 1 support person aged 4 or older with them. For treatment visits, patients cannot have anyone with them due to current Covid guidelines and our immunocompromised population.

## 2021-02-07 NOTE — Progress Notes (Signed)
Remote ICD transmission.   

## 2021-02-13 ENCOUNTER — Inpatient Hospital Stay: Payer: Medicare Other | Attending: Internal Medicine

## 2021-02-13 ENCOUNTER — Telehealth: Payer: Self-pay

## 2021-02-13 ENCOUNTER — Other Ambulatory Visit: Payer: Self-pay

## 2021-02-13 ENCOUNTER — Other Ambulatory Visit: Payer: Medicare Other

## 2021-02-13 DIAGNOSIS — C7931 Secondary malignant neoplasm of brain: Secondary | ICD-10-CM | POA: Diagnosis not present

## 2021-02-13 DIAGNOSIS — C3411 Malignant neoplasm of upper lobe, right bronchus or lung: Secondary | ICD-10-CM

## 2021-02-13 DIAGNOSIS — C342 Malignant neoplasm of middle lobe, bronchus or lung: Secondary | ICD-10-CM | POA: Insufficient documentation

## 2021-02-13 DIAGNOSIS — Z79899 Other long term (current) drug therapy: Secondary | ICD-10-CM | POA: Diagnosis not present

## 2021-02-13 LAB — CBC WITH DIFFERENTIAL (CANCER CENTER ONLY)
Abs Immature Granulocytes: 0 10*3/uL (ref 0.00–0.07)
Band Neutrophils: 1 %
Basophils Absolute: 0 10*3/uL (ref 0.0–0.1)
Basophils Relative: 0 %
Eosinophils Absolute: 0 10*3/uL (ref 0.0–0.5)
Eosinophils Relative: 0 %
HCT: 36.2 % — ABNORMAL LOW (ref 39.0–52.0)
Hemoglobin: 11.5 g/dL — ABNORMAL LOW (ref 13.0–17.0)
Lymphocytes Relative: 22 %
Lymphs Abs: 1.5 10*3/uL (ref 0.7–4.0)
MCH: 29.1 pg (ref 26.0–34.0)
MCHC: 31.8 g/dL (ref 30.0–36.0)
MCV: 91.6 fL (ref 80.0–100.0)
Monocytes Absolute: 0.2 10*3/uL (ref 0.1–1.0)
Monocytes Relative: 3 %
Neutro Abs: 5.1 10*3/uL (ref 1.7–7.7)
Neutrophils Relative %: 74 %
Platelet Count: 55 10*3/uL — ABNORMAL LOW (ref 150–400)
RBC: 3.95 MIL/uL — ABNORMAL LOW (ref 4.22–5.81)
RDW: 17.1 % — ABNORMAL HIGH (ref 11.5–15.5)
WBC Count: 6.8 10*3/uL (ref 4.0–10.5)
nRBC: 0 % (ref 0.0–0.2)

## 2021-02-13 LAB — CMP (CANCER CENTER ONLY)
ALT: 25 U/L (ref 0–44)
AST: 21 U/L (ref 15–41)
Albumin: 2.9 g/dL — ABNORMAL LOW (ref 3.5–5.0)
Alkaline Phosphatase: 188 U/L — ABNORMAL HIGH (ref 38–126)
Anion gap: 14 (ref 5–15)
BUN: 37 mg/dL — ABNORMAL HIGH (ref 6–20)
CO2: 28 mmol/L (ref 22–32)
Calcium: 8.8 mg/dL — ABNORMAL LOW (ref 8.9–10.3)
Chloride: 94 mmol/L — ABNORMAL LOW (ref 98–111)
Creatinine: 1.67 mg/dL — ABNORMAL HIGH (ref 0.61–1.24)
GFR, Estimated: 47 mL/min — ABNORMAL LOW (ref >60)
Glucose, Bld: 471 mg/dL — ABNORMAL HIGH (ref 70–99)
Potassium: 3.7 mmol/L (ref 3.5–5.1)
Sodium: 136 mmol/L (ref 135–145)
Total Bilirubin: 0.8 mg/dL (ref 0.3–1.2)
Total Protein: 6.6 g/dL (ref 6.5–8.1)

## 2021-02-13 LAB — TSH: TSH: 1.337 u[IU]/mL (ref 0.350–4.500)

## 2021-02-13 NOTE — Telephone Encounter (Signed)
Per Cassandra PA-C, pts BS is [phone] and please advise pt to take his insulin as directed.  I have called the pt and advised as indicated. Pt states he has not taken his insulin in 3-4 days but will start back taking it. Pt expressed understanding of this information.

## 2021-02-15 ENCOUNTER — Encounter: Payer: Self-pay | Admitting: Student

## 2021-02-15 NOTE — Progress Notes (Signed)
   Called by MRI due to request for MRI in a patient with an ICD that is not "MRI compatible".    We know [name] well. He has metastatic lung cancer that at times requires monitoring by MRI.   His ICD has met ERI as of 08/2020 with NO plans for generator change  His tachy-therapies have been disabled as of 11/14/2020.  Pts device is essentially non-functioning in line with his Goals of Care discussions, and he is OK to proceed with MRI.   [employer] will check device that day to re-confirm deactivation of tachy-therapies.   Discussed above with Dr. Lovena Le.   Legrand Como [address] [place], PA-C  02/15/2021 1:31 PM

## 2021-02-20 ENCOUNTER — Other Ambulatory Visit: Payer: Medicare Other

## 2021-02-21 ENCOUNTER — Inpatient Hospital Stay: Payer: Medicare Other

## 2021-02-21 ENCOUNTER — Other Ambulatory Visit: Payer: Self-pay

## 2021-02-21 ENCOUNTER — Ambulatory Visit (HOSPITAL_COMMUNITY)
Admission: RE | Admit: 2021-02-21 | Discharge: 2021-02-21 | Disposition: A | Discharge status: Home / Self Care | Payer: Medicare Other | Source: Ambulatory Visit | Attending: Physician Assistant | Admitting: Physician Assistant

## 2021-02-21 ENCOUNTER — Encounter (INDEPENDENT_AMBULATORY_CARE_PROVIDER_SITE_OTHER): Payer: Self-pay

## 2021-02-21 DIAGNOSIS — C3411 Malignant neoplasm of upper lobe, right bronchus or lung: Secondary | ICD-10-CM | POA: Insufficient documentation

## 2021-02-21 DIAGNOSIS — T827XXA Infection and inflammatory reaction due to other cardiac and vascular devices, implants and grafts, initial encounter: Secondary | ICD-10-CM | POA: Diagnosis not present

## 2021-02-21 DIAGNOSIS — I38 Endocarditis, valve unspecified: Secondary | ICD-10-CM | POA: Diagnosis not present

## 2021-02-21 DIAGNOSIS — C3490 Malignant neoplasm of unspecified part of unspecified bronchus or lung: Secondary | ICD-10-CM

## 2021-02-21 DIAGNOSIS — C7931 Secondary malignant neoplasm of brain: Secondary | ICD-10-CM | POA: Diagnosis not present

## 2021-02-21 DIAGNOSIS — Z79899 Other long term (current) drug therapy: Secondary | ICD-10-CM | POA: Diagnosis not present

## 2021-02-21 DIAGNOSIS — C342 Malignant neoplasm of middle lobe, bronchus or lung: Secondary | ICD-10-CM | POA: Diagnosis present

## 2021-02-21 LAB — CBC WITH DIFFERENTIAL (CANCER CENTER ONLY)
Abs Immature Granulocytes: 0.06 10*3/uL (ref 0.00–0.07)
Basophils Absolute: 0.1 10*3/uL (ref 0.0–0.1)
Basophils Relative: 1 %
Eosinophils Absolute: 0.1 10*3/uL (ref 0.0–0.5)
Eosinophils Relative: 1 %
HCT: 36.2 % — ABNORMAL LOW (ref 39.0–52.0)
Hemoglobin: 11.5 g/dL — ABNORMAL LOW (ref 13.0–17.0)
Immature Granulocytes: 1 %
Lymphocytes Relative: 14 %
Lymphs Abs: 1.5 10*3/uL (ref 0.7–4.0)
MCH: 29 pg (ref 26.0–34.0)
MCHC: 31.8 g/dL (ref 30.0–36.0)
MCV: 91.2 fL (ref 80.0–100.0)
Monocytes Absolute: 1.2 10*3/uL — ABNORMAL HIGH (ref 0.1–1.0)
Monocytes Relative: 11 %
Neutro Abs: 7.6 10*3/uL (ref 1.7–7.7)
Neutrophils Relative %: 72 %
Platelet Count: 84 10*3/uL — ABNORMAL LOW (ref 150–400)
RBC: 3.97 MIL/uL — ABNORMAL LOW (ref 4.22–5.81)
RDW: 17.6 % — ABNORMAL HIGH (ref 11.5–15.5)
WBC Count: 10.4 10*3/uL (ref 4.0–10.5)
nRBC: 0 % (ref 0.0–0.2)

## 2021-02-21 LAB — CMP (CANCER CENTER ONLY)
ALT: 19 U/L (ref 0–44)
AST: 21 U/L (ref 15–41)
Albumin: 3.2 g/dL — ABNORMAL LOW (ref 3.5–5.0)
Alkaline Phosphatase: 190 U/L — ABNORMAL HIGH (ref 38–126)
Anion gap: 12 (ref 5–15)
BUN: 29 mg/dL — ABNORMAL HIGH (ref 6–20)
CO2: 32 mmol/L (ref 22–32)
Calcium: 9.5 mg/dL (ref 8.9–10.3)
Chloride: 93 mmol/L — ABNORMAL LOW (ref 98–111)
Creatinine: 1.52 mg/dL — ABNORMAL HIGH (ref 0.61–1.24)
GFR, Estimated: 53 mL/min — ABNORMAL LOW (ref >60)
Glucose, Bld: 239 mg/dL — ABNORMAL HIGH (ref 70–99)
Potassium: 3.8 mmol/L (ref 3.5–5.1)
Sodium: 137 mmol/L (ref 135–145)
Total Bilirubin: 0.5 mg/dL (ref 0.3–1.2)
Total Protein: 6.7 g/dL (ref 6.5–8.1)

## 2021-02-21 IMAGING — CT CT ABD-PELV W/ CM
2 of 5 series · 12 of 36 positions shown, 15 images · IV contrast (omnipaque)
Comparison: Most recent CT chest, abdomen and pelvis [DATE].
[DATE] PET-CT.

CLINICAL DATA: Primary Cancer Type: Lung
TECHNIQUE: Multidetector CT imaging of the chest, abdomen and pelvis was
performed following the standard protocol during bolus
administration of intravenous contrast.

CONTRAST:  75mL OMNIPAQUE IOHEXOL 300 MG/ML  SOLN

[Series 3: cap with · axial · 0.90mm/px · z∈[-424,+132]mm · 9 of 137 slices shown, 12 images]
[im 13/137  mediastinal]
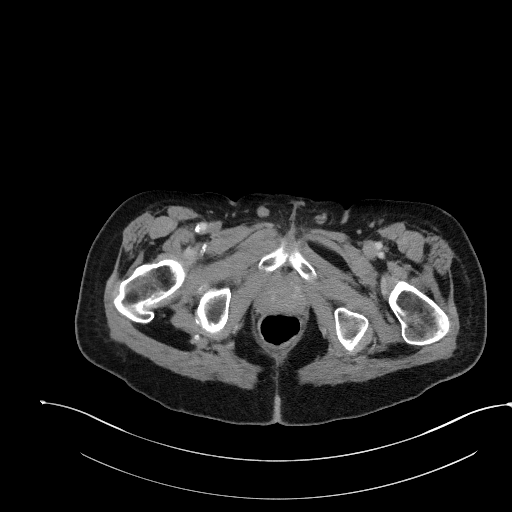
[im 13/137  lung]
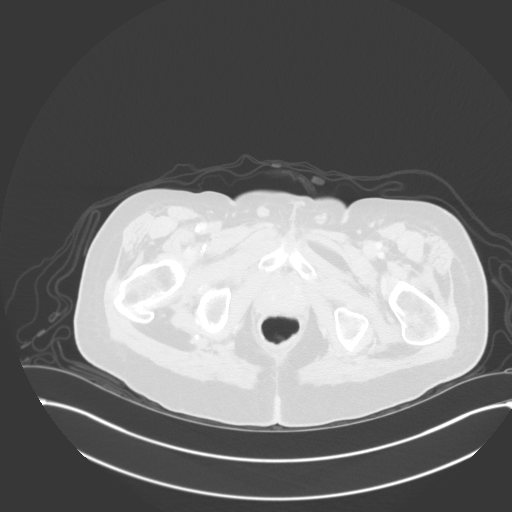
[im 25/137  lung]
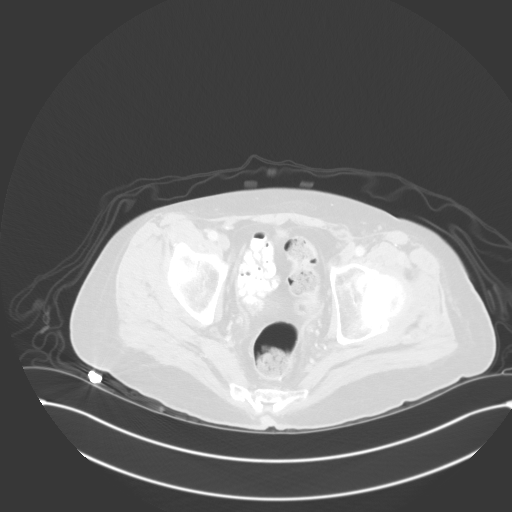
[im 38/137  lung]
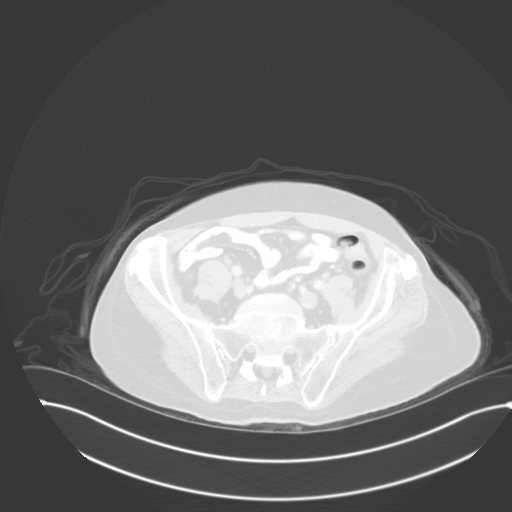
[im 50/137  lung]
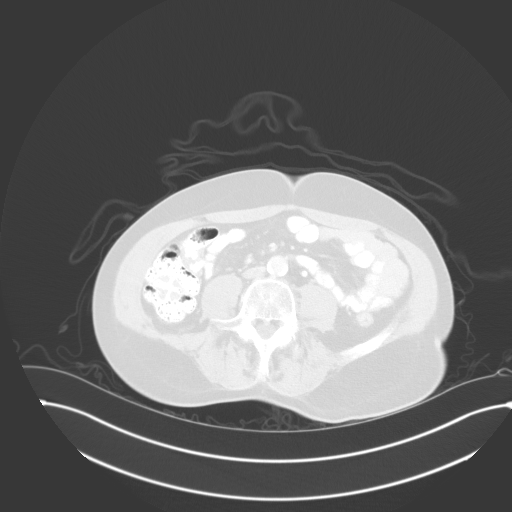
[im 75/137  mediastinal]
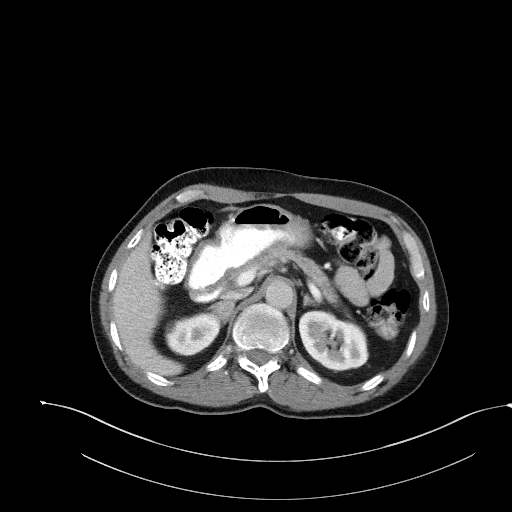
[im 75/137  lung]
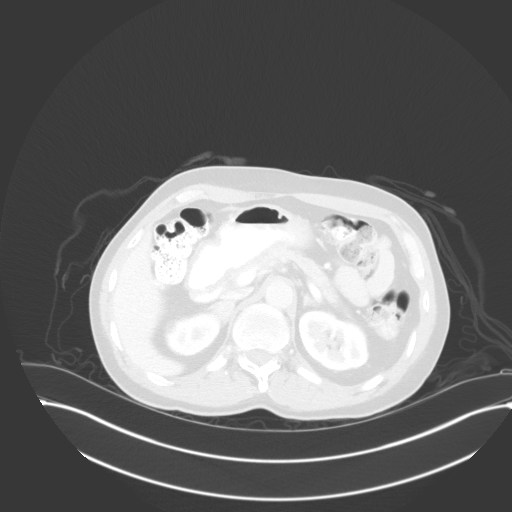
[im 87/137  lung]
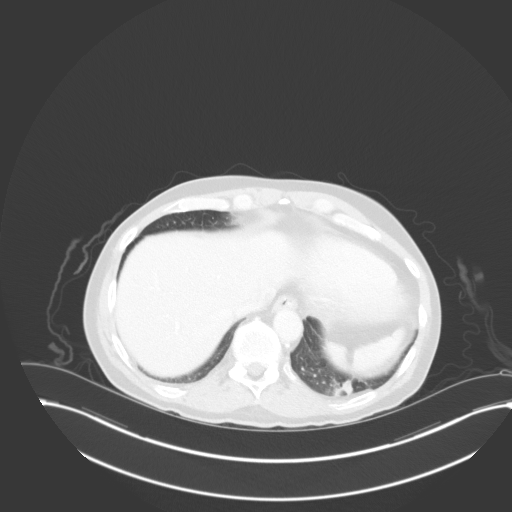
[im 99/137  lung]
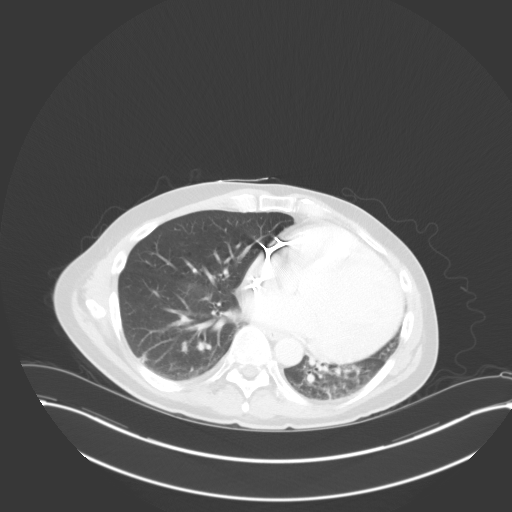
[im 112/137  lung]
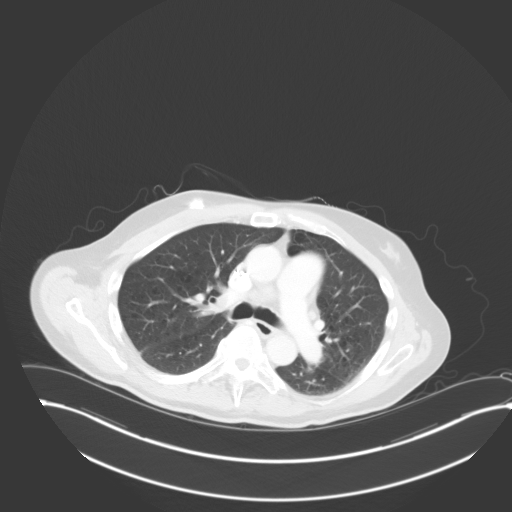
[im 124/137  mediastinal]
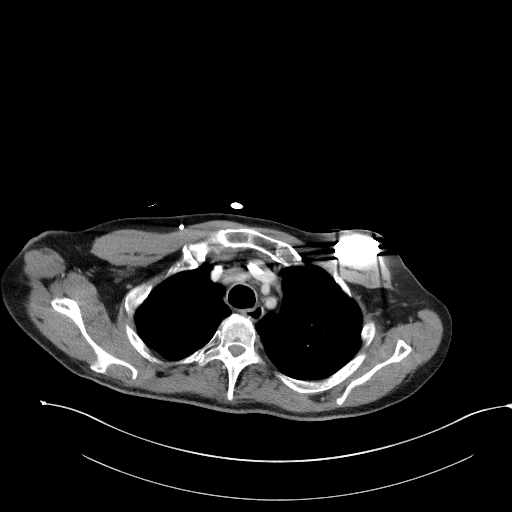
[im 124/137  lung]
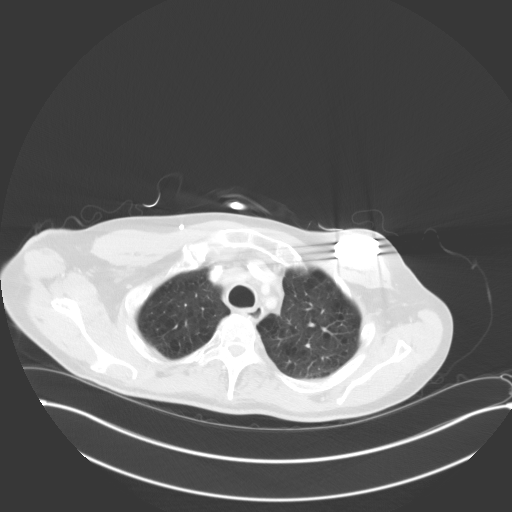

[Series 5: coronals · coronal · 0.85mm/px · 3 of 137 slices shown]
[im 28/137  lung]
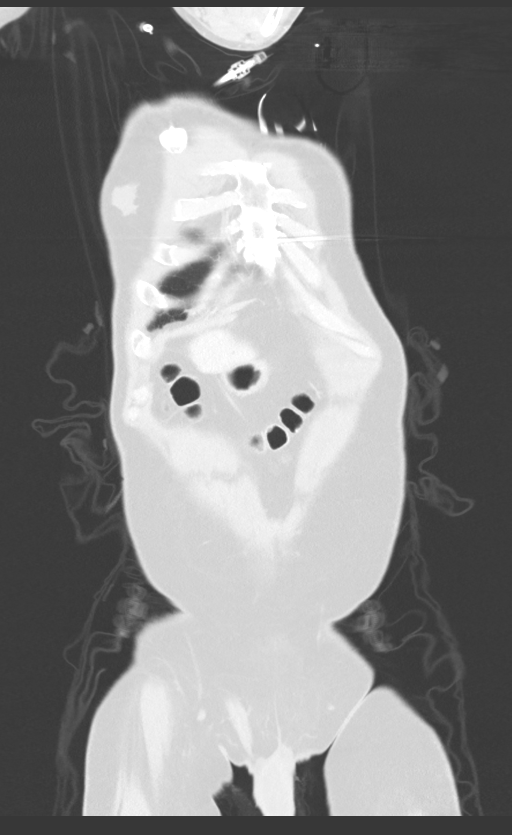
[im 55/137  lung]
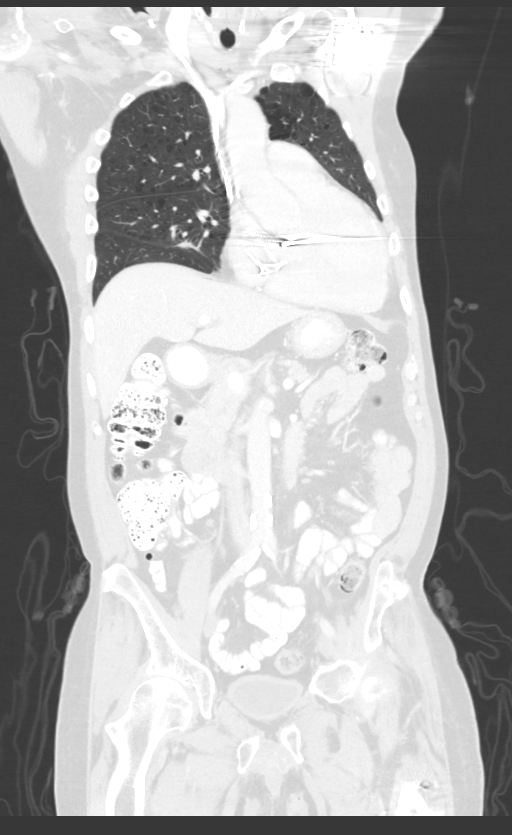
[im 82/137  lung]
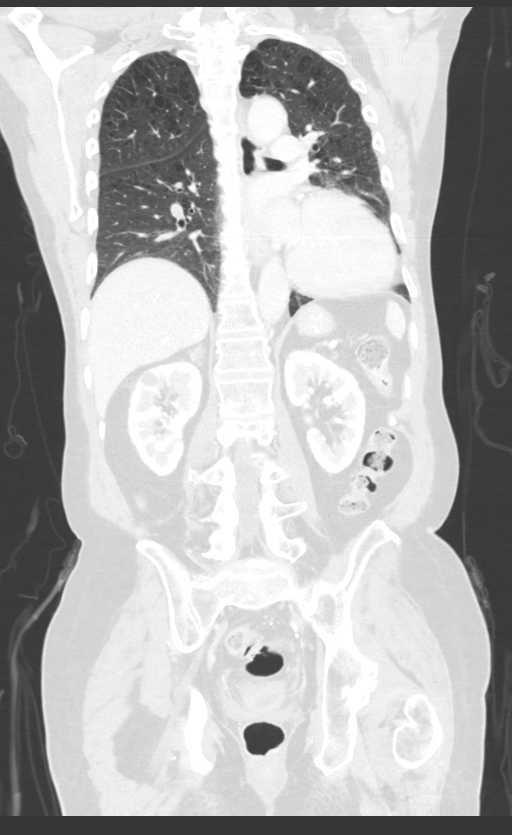

[12 of 36 positions shown; findings below may reference images not displayed]

Imaging Indication: Assess response to therapy

Interval therapy since last imaging? Yes

Initial Cancer Diagnosis

Date: [DATE]; Established by: Presumed

Detailed Pathology: Extensive stage small cell lung cancer.

Primary Tumor location: Right middle lobe.  Brain metastasis.

Surgeries: ICD.  Cholecystectomy.

Chemotherapy: Yes; Ongoing? Yes; Most recent administration:
[DATE]

Immunotherapy?  Yes; Type: Imfinzi; Ongoing? No

Radiation therapy? Yes; Date Range: [DATE] - [DATE]; Target:
Brain

EXAM:
CT CHEST, ABDOMEN, AND PELVIS WITH CONTRAST
FINDINGS: CT CHEST FINDINGS

Cardiovascular: Left chest multi lead pacer defibrillator. Right
chest port catheter. Aortic atherosclerosis. Global cardiomegaly.
Three-vessel coronary artery calcifications and/or stents. No
pericardial effusion. Incidental note of new bilateral pulmonary
embolism, noted in the distal right pulmonary artery and more distal
vessels as well as in the lobar to segmental vessels of the left
lower lobe.

Mediastinum/Nodes: Interval decrease in size of previously enlarged
pretracheal, subcarinal, and right hilar lymph nodes, largest
pretracheal node measuring 2.0 x 1.0 cm, previously 2.3 x 1.8 cm
when measured similarly (series 3, image 25). Thyroid gland,
trachea, and esophagus demonstrate no significant findings.

Lungs/Pleura: Moderate centrilobular emphysema. Diffuse bilateral
bronchial wall thickening. Interval resolution of previously seen
small bilateral pleural effusions. Interval decrease in size of a
spiculated nodule of the posterior lateral segment right middle lobe
abutting the major fissure, measuring 1.3 x 0.7 cm previously 1.6 x
1.0 cm when measured similarly (series 8, image 102).

Musculoskeletal: No chest wall mass or suspicious bone lesions
identified.

CT ABDOMEN PELVIS FINDINGS

Hepatobiliary: No focal liver abnormality is seen. Status post
cholecystectomy. No biliary dilatation.

Pancreas: Unremarkable. No pancreatic ductal dilatation or
surrounding inflammatory changes.

Spleen: Normal in size without significant abnormality.

Adrenals/Urinary Tract: Stable, definitively benign right adrenal
adenoma (series 3, image 60). Punctuate nonobstructive calculus of
the inferior pole of the right kidney. No ureteral calculus or
hydronephrosis. Thickening of the urinary bladder wall.

Stomach/Bowel: Stomach is within normal limits. Appendix appears
normal. No evidence of bowel wall thickening, distention, or
inflammatory changes.

Vascular/Lymphatic: Aortic atherosclerosis. No enlarged abdominal or
pelvic lymph nodes.

Reproductive: No mass or other abnormality.

Other: No abdominal wall hernia or abnormality. No abdominopelvic
ascites.

Musculoskeletal: No acute or significant osseous findings.
IMPRESSION: 1. Interval decrease in size of a spiculated nodule of the posterior
lateral segment right middle lobe.
2. Interval decrease in size of previously enlarged pretracheal,
subcarinal, and right hilar lymph nodes.
3. Interval resolution of previously seen small bilateral pleural
effusions.
4. Findings are consistent with treatment response.
5. Incidental note of new bilateral pulmonary embolism, noted in the
distal right pulmonary artery and more distal vessels as well as in
the lobar to segmental vessels of the left lower lobe.
6. No evidence of metastatic disease in the abdomen or pelvis.
7. Nonobstructive right nephrolithiasis.
8. New thickening of the urinary bladder wall, which may be due to
nonspecific infectious or inflammatory cystitis. Correlate with
urinalysis.
9. Emphysema.
10. Cardiomegaly and coronary artery disease.

Call report request to PA PAKR was placed at the
time of interpretation. Final communication will be documented.

Aortic Atherosclerosis ([8O]-[8O]) and Emphysema ([8O]-[8O]).

## 2021-02-21 IMAGING — CT CT CHEST W/ CM
2 of 5 series · 12 of 36 positions shown, 15 images · IV contrast (APPLIED)
Comparison: Most recent CT chest, abdomen and pelvis [DATE].
[DATE] PET-CT.

CLINICAL DATA: Primary Cancer Type: Lung
TECHNIQUE: Multidetector CT imaging of the chest, abdomen and pelvis was
performed following the standard protocol during bolus
administration of intravenous contrast.

CONTRAST:  75mL OMNIPAQUE IOHEXOL 300 MG/ML  SOLN

[Series 3: cap with · axial · 0.90mm/px · z∈[-424,+132]mm · 9 of 137 slices shown, 12 images]
[im 13/137  mediastinal]
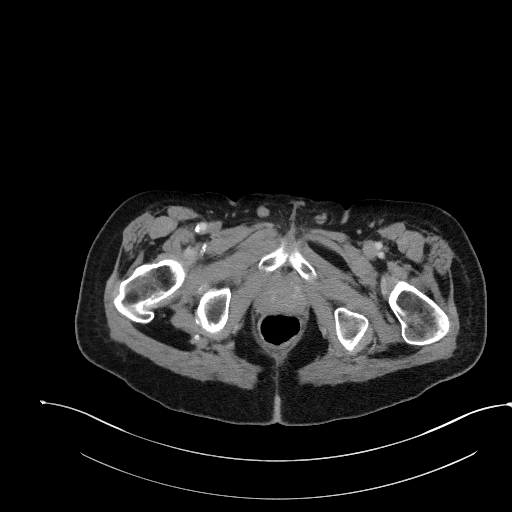
[im 13/137  lung]
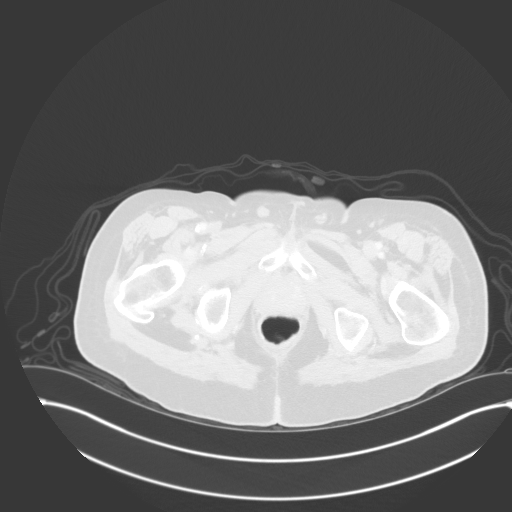
[im 25/137  lung]
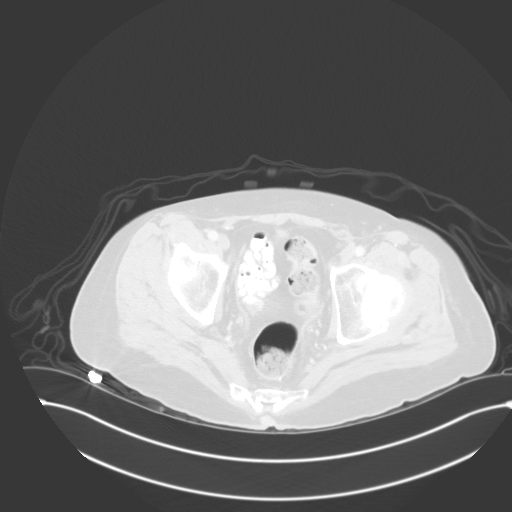
[im 38/137  lung]
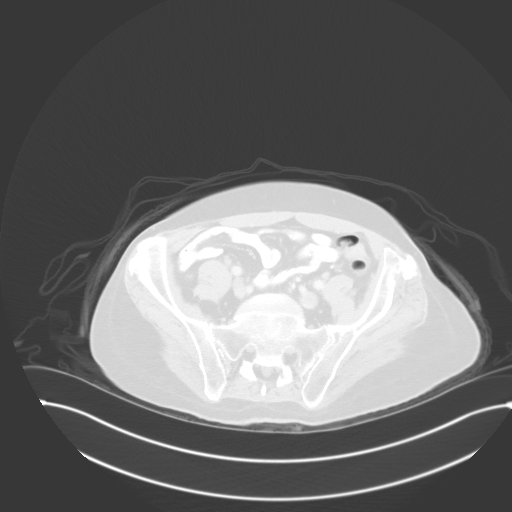
[im 50/137  lung]
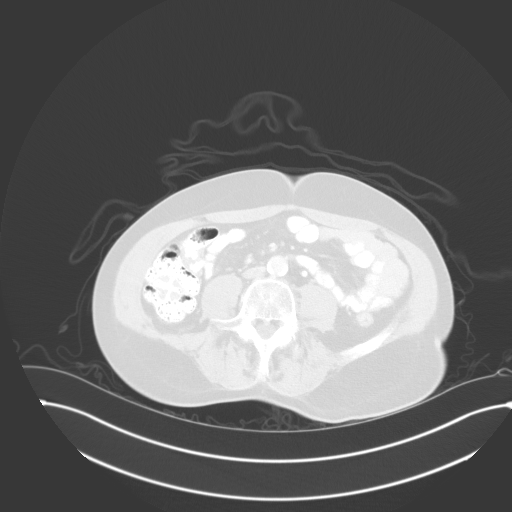
[im 75/137  mediastinal]
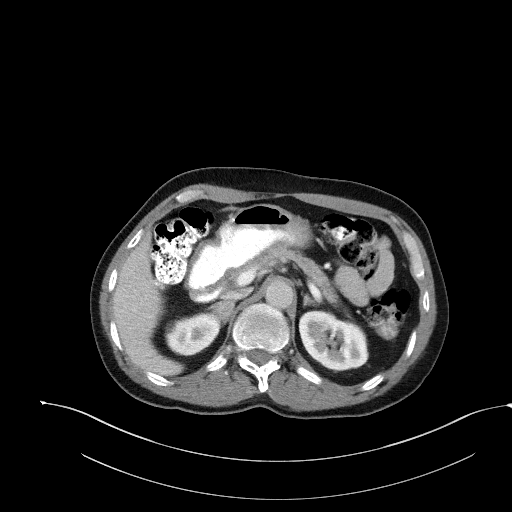
[im 75/137  lung]
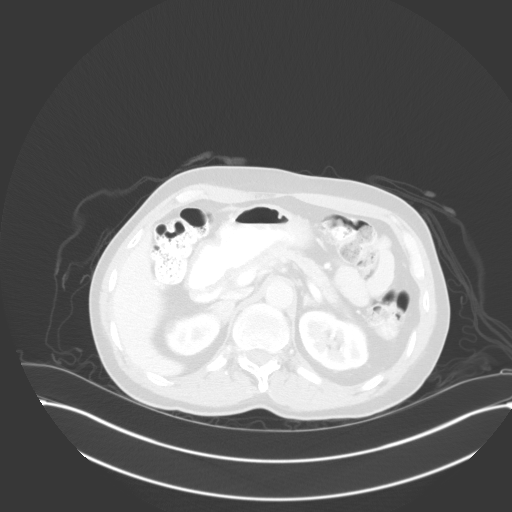
[im 87/137  lung]
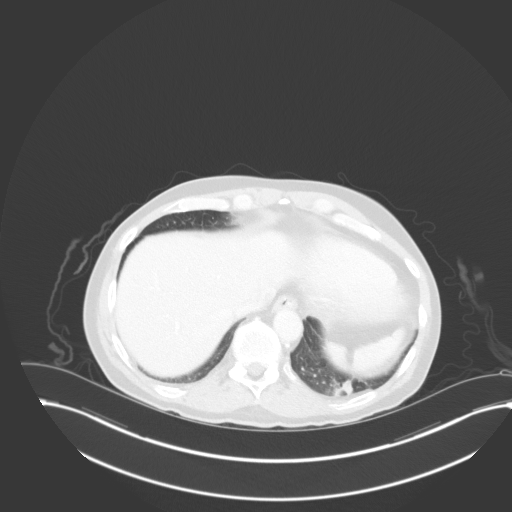
[im 99/137  lung]
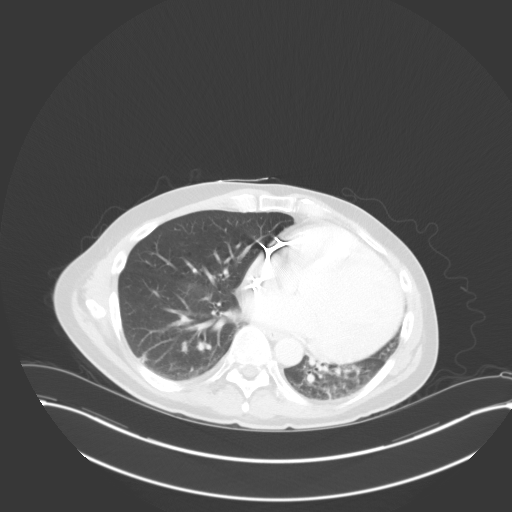
[im 112/137  lung]
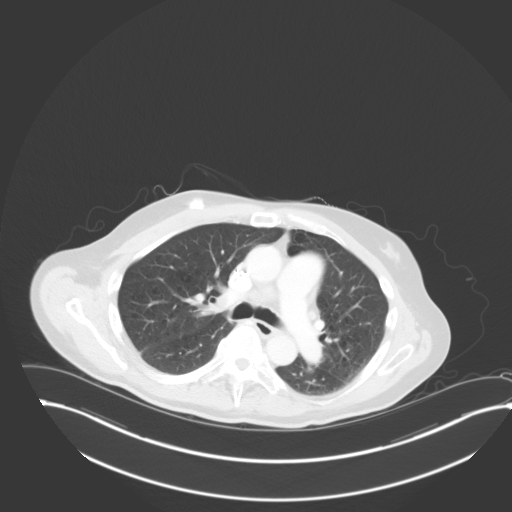
[im 124/137  mediastinal]
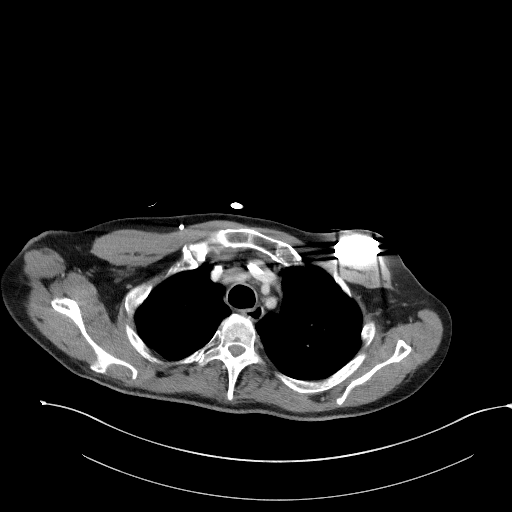
[im 124/137  lung]
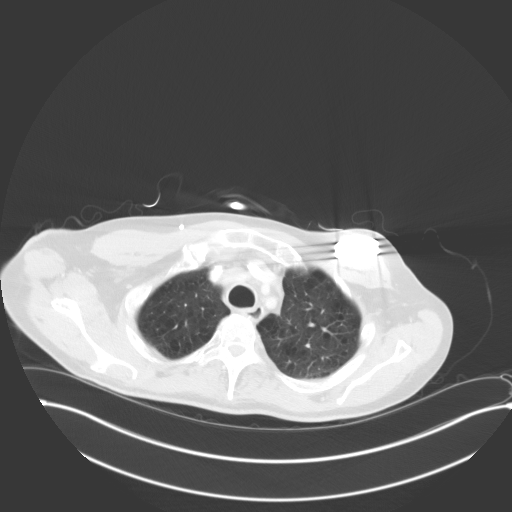

[Series 5: coronals · coronal · 0.85mm/px · 3 of 137 slices shown]
[im 28/137  lung]
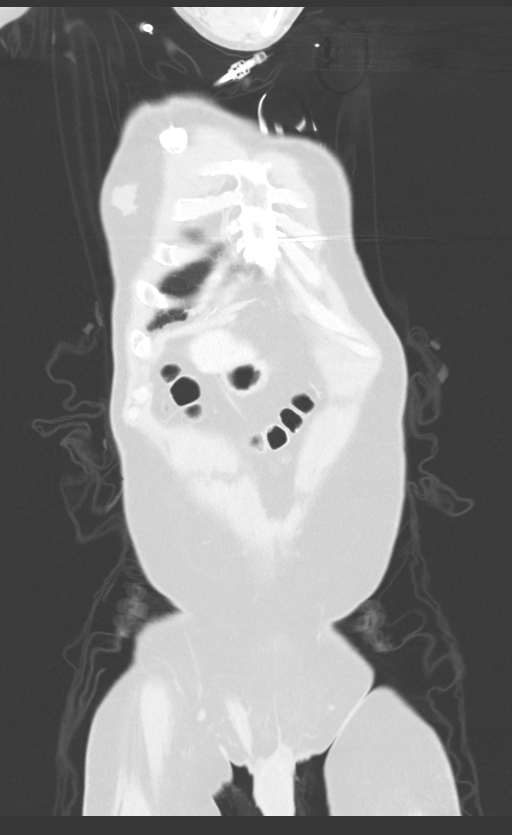
[im 55/137  lung]
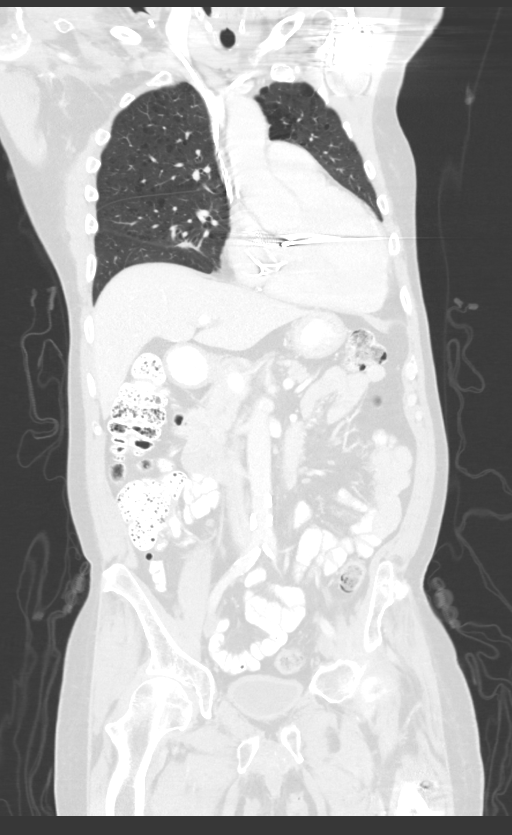
[im 82/137  lung]
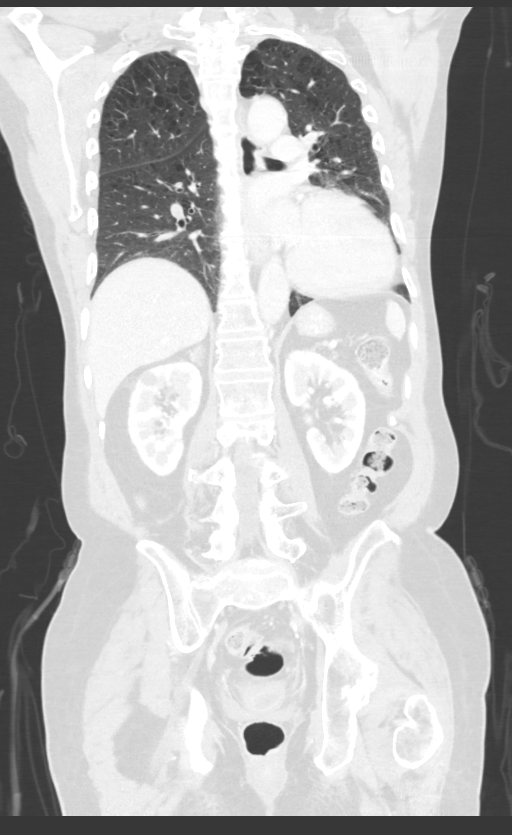

[12 of 36 positions shown; findings below may reference images not displayed]

Imaging Indication: Assess response to therapy

Interval therapy since last imaging? Yes

Initial Cancer Diagnosis

Date: [DATE]; Established by: Presumed

Detailed Pathology: Extensive stage small cell lung cancer.

Primary Tumor location: Right middle lobe.  Brain metastasis.

Surgeries: ICD.  Cholecystectomy.

Chemotherapy: Yes; Ongoing? Yes; Most recent administration:
[DATE]

Immunotherapy?  Yes; Type: Imfinzi; Ongoing? No

Radiation therapy? Yes; Date Range: [DATE] - [DATE]; Target:
Brain

EXAM:
CT CHEST, ABDOMEN, AND PELVIS WITH CONTRAST
FINDINGS: CT CHEST FINDINGS

Cardiovascular: Left chest multi lead pacer defibrillator. Right
chest port catheter. Aortic atherosclerosis. Global cardiomegaly.
Three-vessel coronary artery calcifications and/or stents. No
pericardial effusion. Incidental note of new bilateral pulmonary
embolism, noted in the distal right pulmonary artery and more distal
vessels as well as in the lobar to segmental vessels of the left
lower lobe.

Mediastinum/Nodes: Interval decrease in size of previously enlarged
pretracheal, subcarinal, and right hilar lymph nodes, largest
pretracheal node measuring 2.0 x 1.0 cm, previously 2.3 x 1.8 cm
when measured similarly (series 3, image 25). Thyroid gland,
trachea, and esophagus demonstrate no significant findings.

Lungs/Pleura: Moderate centrilobular emphysema. Diffuse bilateral
bronchial wall thickening. Interval resolution of previously seen
small bilateral pleural effusions. Interval decrease in size of a
spiculated nodule of the posterior lateral segment right middle lobe
abutting the major fissure, measuring 1.3 x 0.7 cm previously 1.6 x
1.0 cm when measured similarly (series 8, image 102).

Musculoskeletal: No chest wall mass or suspicious bone lesions
identified.

CT ABDOMEN PELVIS FINDINGS

Hepatobiliary: No focal liver abnormality is seen. Status post
cholecystectomy. No biliary dilatation.

Pancreas: Unremarkable. No pancreatic ductal dilatation or
surrounding inflammatory changes.

Spleen: Normal in size without significant abnormality.

Adrenals/Urinary Tract: Stable, definitively benign right adrenal
adenoma (series 3, image 60). Punctuate nonobstructive calculus of
the inferior pole of the right kidney. No ureteral calculus or
hydronephrosis. Thickening of the urinary bladder wall.

Stomach/Bowel: Stomach is within normal limits. Appendix appears
normal. No evidence of bowel wall thickening, distention, or
inflammatory changes.

Vascular/Lymphatic: Aortic atherosclerosis. No enlarged abdominal or
pelvic lymph nodes.

Reproductive: No mass or other abnormality.

Other: No abdominal wall hernia or abnormality. No abdominopelvic
ascites.

Musculoskeletal: No acute or significant osseous findings.
IMPRESSION: 1. Interval decrease in size of a spiculated nodule of the posterior
lateral segment right middle lobe.
2. Interval decrease in size of previously enlarged pretracheal,
subcarinal, and right hilar lymph nodes.
3. Interval resolution of previously seen small bilateral pleural
effusions.
4. Findings are consistent with treatment response.
5. Incidental note of new bilateral pulmonary embolism, noted in the
distal right pulmonary artery and more distal vessels as well as in
the lobar to segmental vessels of the left lower lobe.
6. No evidence of metastatic disease in the abdomen or pelvis.
7. Nonobstructive right nephrolithiasis.
8. New thickening of the urinary bladder wall, which may be due to
nonspecific infectious or inflammatory cystitis. Correlate with
urinalysis.
9. Emphysema.
10. Cardiomegaly and coronary artery disease.

Call report request to [place] PAKR was placed at the
time of interpretation. Final communication will be documented.

Aortic Atherosclerosis ([8O]-[8O]) and Emphysema ([8O]-[8O]).

## 2021-02-21 MED ORDER — HEPARIN SOD (PORK) LOCK FLUSH 100 UNIT/ML IV SOLN
INTRAVENOUS | Status: AC
  Administered 2021-02-21: 500 [IU]
  Filled 2021-02-21: qty 5

## 2021-02-21 MED ORDER — HEPARIN SOD (PORK) LOCK FLUSH 100 UNIT/ML IV SOLN: 500.0000 [IU] | Freq: Once | INTRAVENOUS | Status: DC

## 2021-02-21 MED ORDER — IOHEXOL 300 MG/ML  SOLN
75.0000 mL | Freq: Once | INTRAMUSCULAR | Status: AC | PRN
  Administered 2021-02-21: 75 mL via INTRAVENOUS

## 2021-02-22 ENCOUNTER — Telehealth: Payer: Self-pay | Admitting: Medical Oncology

## 2021-02-22 ENCOUNTER — Emergency Department (HOSPITAL_COMMUNITY): Payer: Medicare Other

## 2021-02-22 ENCOUNTER — Inpatient Hospital Stay (HOSPITAL_COMMUNITY)
Admission: EM | Admit: 2021-02-22 | Discharge: 2021-02-27 | DRG: 314 | Disposition: A | Discharge status: Hospice | Payer: Medicare Other | Attending: Internal Medicine | Admitting: Internal Medicine

## 2021-02-22 ENCOUNTER — Other Ambulatory Visit: Payer: Self-pay

## 2021-02-22 ENCOUNTER — Encounter (HOSPITAL_COMMUNITY): Payer: Self-pay

## 2021-02-22 DIAGNOSIS — C7931 Secondary malignant neoplasm of brain: Secondary | ICD-10-CM | POA: Diagnosis present

## 2021-02-22 DIAGNOSIS — E1139 Type 2 diabetes mellitus with other diabetic ophthalmic complication: Secondary | ICD-10-CM | POA: Diagnosis present

## 2021-02-22 DIAGNOSIS — Z8619 Personal history of other infectious and parasitic diseases: Secondary | ICD-10-CM

## 2021-02-22 DIAGNOSIS — Z8042 Family history of malignant neoplasm of prostate: Secondary | ICD-10-CM

## 2021-02-22 DIAGNOSIS — I2699 Other pulmonary embolism without acute cor pulmonale: Secondary | ICD-10-CM | POA: Diagnosis not present

## 2021-02-22 DIAGNOSIS — Z7901 Long term (current) use of anticoagulants: Secondary | ICD-10-CM | POA: Diagnosis not present

## 2021-02-22 DIAGNOSIS — I130 Hypertensive heart and chronic kidney disease with heart failure and stage 1 through stage 4 chronic kidney disease, or unspecified chronic kidney disease: Secondary | ICD-10-CM | POA: Diagnosis present

## 2021-02-22 DIAGNOSIS — Z833 Family history of diabetes mellitus: Secondary | ICD-10-CM

## 2021-02-22 DIAGNOSIS — E1169 Type 2 diabetes mellitus with other specified complication: Secondary | ICD-10-CM | POA: Diagnosis present

## 2021-02-22 DIAGNOSIS — N390 Urinary tract infection, site not specified: Secondary | ICD-10-CM | POA: Diagnosis present

## 2021-02-22 DIAGNOSIS — R0602 Shortness of breath: Secondary | ICD-10-CM

## 2021-02-22 DIAGNOSIS — I38 Endocarditis, valve unspecified: Secondary | ICD-10-CM

## 2021-02-22 DIAGNOSIS — Z95810 Presence of automatic (implantable) cardiac defibrillator: Secondary | ICD-10-CM | POA: Diagnosis present

## 2021-02-22 DIAGNOSIS — Z20822 Contact with and (suspected) exposure to covid-19: Secondary | ICD-10-CM | POA: Diagnosis present

## 2021-02-22 DIAGNOSIS — Z794 Long term (current) use of insulin: Secondary | ICD-10-CM | POA: Diagnosis not present

## 2021-02-22 DIAGNOSIS — I255 Ischemic cardiomyopathy: Secondary | ICD-10-CM | POA: Diagnosis present

## 2021-02-22 DIAGNOSIS — E785 Hyperlipidemia, unspecified: Secondary | ICD-10-CM | POA: Diagnosis present

## 2021-02-22 DIAGNOSIS — E1122 Type 2 diabetes mellitus with diabetic chronic kidney disease: Secondary | ICD-10-CM | POA: Diagnosis present

## 2021-02-22 DIAGNOSIS — I10 Essential (primary) hypertension: Secondary | ICD-10-CM | POA: Diagnosis not present

## 2021-02-22 DIAGNOSIS — I339 Acute and subacute endocarditis, unspecified: Secondary | ICD-10-CM | POA: Diagnosis present

## 2021-02-22 DIAGNOSIS — T827XXA Infection and inflammatory reaction due to other cardiac and vascular devices, implants and grafts, initial encounter: Principal | ICD-10-CM | POA: Diagnosis present

## 2021-02-22 DIAGNOSIS — I2510 Atherosclerotic heart disease of native coronary artery without angina pectoris: Secondary | ICD-10-CM | POA: Diagnosis present

## 2021-02-22 DIAGNOSIS — I330 Acute and subacute infective endocarditis: Secondary | ICD-10-CM | POA: Diagnosis not present

## 2021-02-22 DIAGNOSIS — Z87891 Personal history of nicotine dependence: Secondary | ICD-10-CM | POA: Diagnosis not present

## 2021-02-22 DIAGNOSIS — I2694 Multiple subsegmental pulmonary emboli without acute cor pulmonale: Secondary | ICD-10-CM | POA: Diagnosis not present

## 2021-02-22 DIAGNOSIS — I5022 Chronic systolic (congestive) heart failure: Secondary | ICD-10-CM | POA: Diagnosis present

## 2021-02-22 DIAGNOSIS — E7800 Pure hypercholesterolemia, unspecified: Secondary | ICD-10-CM | POA: Diagnosis present

## 2021-02-22 DIAGNOSIS — Z66 Do not resuscitate: Secondary | ICD-10-CM | POA: Diagnosis not present

## 2021-02-22 DIAGNOSIS — Z8673 Personal history of transient ischemic attack (TIA), and cerebral infarction without residual deficits: Secondary | ICD-10-CM | POA: Diagnosis not present

## 2021-02-22 DIAGNOSIS — Z515 Encounter for palliative care: Secondary | ICD-10-CM | POA: Diagnosis not present

## 2021-02-22 DIAGNOSIS — D6859 Other primary thrombophilia: Secondary | ICD-10-CM | POA: Diagnosis present

## 2021-02-22 DIAGNOSIS — I5042 Chronic combined systolic (congestive) and diastolic (congestive) heart failure: Secondary | ICD-10-CM

## 2021-02-22 DIAGNOSIS — F1920 Other psychoactive substance dependence, uncomplicated: Secondary | ICD-10-CM | POA: Diagnosis present

## 2021-02-22 DIAGNOSIS — B9620 Unspecified Escherichia coli [E. coli] as the cause of diseases classified elsewhere: Secondary | ICD-10-CM | POA: Diagnosis present

## 2021-02-22 DIAGNOSIS — J432 Centrilobular emphysema: Secondary | ICD-10-CM | POA: Diagnosis present

## 2021-02-22 DIAGNOSIS — Z79899 Other long term (current) drug therapy: Secondary | ICD-10-CM

## 2021-02-22 DIAGNOSIS — I82412 Acute embolism and thrombosis of left femoral vein: Secondary | ICD-10-CM | POA: Diagnosis present

## 2021-02-22 DIAGNOSIS — Z8249 Family history of ischemic heart disease and other diseases of the circulatory system: Secondary | ICD-10-CM

## 2021-02-22 DIAGNOSIS — E11319 Type 2 diabetes mellitus with unspecified diabetic retinopathy without macular edema: Secondary | ICD-10-CM | POA: Diagnosis not present

## 2021-02-22 DIAGNOSIS — Z9049 Acquired absence of other specified parts of digestive tract: Secondary | ICD-10-CM

## 2021-02-22 DIAGNOSIS — I82532 Chronic embolism and thrombosis of left popliteal vein: Secondary | ICD-10-CM | POA: Diagnosis present

## 2021-02-22 DIAGNOSIS — Y831 Surgical operation with implant of artificial internal device as the cause of abnormal reaction of the patient, or of later complication, without mention of misadventure at the time of the procedure: Secondary | ICD-10-CM | POA: Diagnosis present

## 2021-02-22 DIAGNOSIS — C3411 Malignant neoplasm of upper lobe, right bronchus or lung: Secondary | ICD-10-CM | POA: Diagnosis present

## 2021-02-22 DIAGNOSIS — R0902 Hypoxemia: Secondary | ICD-10-CM | POA: Diagnosis present

## 2021-02-22 DIAGNOSIS — Z7951 Long term (current) use of inhaled steroids: Secondary | ICD-10-CM

## 2021-02-22 DIAGNOSIS — Z95828 Presence of other vascular implants and grafts: Secondary | ICD-10-CM

## 2021-02-22 DIAGNOSIS — N1831 Chronic kidney disease, stage 3a: Secondary | ICD-10-CM | POA: Diagnosis present

## 2021-02-22 DIAGNOSIS — Z85118 Personal history of other malignant neoplasm of bronchus and lung: Secondary | ICD-10-CM

## 2021-02-22 LAB — ECHOCARDIOGRAM COMPLETE
AR max vel: 2.22 cm2
AV Area VTI: 2.18 cm2
AV Area mean vel: 2.45 cm2
AV Mean grad: 2 mmHg
AV Peak grad: 3.3 mmHg
Ao pk vel: 0.91 m/s
Area-P 1/2: 6.17 cm2
Height: 72 in
S' Lateral: 5.42 cm
Weight: 2806.4 oz

## 2021-02-22 LAB — BASIC METABOLIC PANEL
Anion gap: 7 (ref 5–15)
BUN: 32 mg/dL — ABNORMAL HIGH (ref 6–20)
CO2: 34 mmol/L — ABNORMAL HIGH (ref 22–32)
Calcium: 9 mg/dL (ref 8.9–10.3)
Chloride: 93 mmol/L — ABNORMAL LOW (ref 98–111)
Creatinine, Ser: 1.39 mg/dL — ABNORMAL HIGH (ref 0.61–1.24)
GFR, Estimated: 59 mL/min — ABNORMAL LOW (ref >60)
Glucose, Bld: 337 mg/dL — ABNORMAL HIGH (ref 70–99)
Potassium: 3.6 mmol/L (ref 3.5–5.1)
Sodium: 134 mmol/L — ABNORMAL LOW (ref 135–145)

## 2021-02-22 LAB — URINALYSIS, ROUTINE W REFLEX MICROSCOPIC
Bilirubin Urine: NEGATIVE
Glucose, UA: 150 mg/dL — AB
Ketones, ur: NEGATIVE mg/dL
Nitrite: POSITIVE — AB
Protein, ur: 100 mg/dL — AB
Specific Gravity, Urine: 1.019 (ref 1.005–1.030)
WBC, UA: >50 WBC/hpf — ABNORMAL HIGH (ref 0–5)
pH: 5 (ref 5.0–8.0)

## 2021-02-22 LAB — PROTIME-INR
INR: 1.1 (ref 0.8–1.2)
Prothrombin Time: 14.7 seconds (ref 11.4–15.2)

## 2021-02-22 LAB — APTT: aPTT: 36 seconds (ref 24–36)

## 2021-02-22 LAB — CBC
HCT: 34.5 % — ABNORMAL LOW (ref 39.0–52.0)
Hemoglobin: 10.9 g/dL — ABNORMAL LOW (ref 13.0–17.0)
MCH: 29.1 pg (ref 26.0–34.0)
MCHC: 31.6 g/dL (ref 30.0–36.0)
MCV: 92.2 fL (ref 80.0–100.0)
Platelets: 128 10*3/uL — ABNORMAL LOW (ref 150–400)
RBC: 3.74 MIL/uL — ABNORMAL LOW (ref 4.22–5.81)
RDW: 17.9 % — ABNORMAL HIGH (ref 11.5–15.5)
WBC: 7.5 10*3/uL (ref 4.0–10.5)
nRBC: 0 % (ref 0.0–0.2)

## 2021-02-22 LAB — TROPONIN I (HIGH SENSITIVITY)
Troponin I (High Sensitivity): 23 ng/L — ABNORMAL HIGH (ref <18)
Troponin I (High Sensitivity): 23 ng/L — ABNORMAL HIGH (ref <18)

## 2021-02-22 LAB — BRAIN NATRIURETIC PEPTIDE: B Natriuretic Peptide: 408.7 pg/mL — ABNORMAL HIGH (ref 0.0–100.0)

## 2021-02-22 LAB — RESP PANEL BY RT-PCR (FLU A&B, COVID) ARPGX2
Influenza A by PCR: NEGATIVE
Influenza B by PCR: NEGATIVE
SARS Coronavirus 2 by RT PCR: NEGATIVE

## 2021-02-22 MED ORDER — SENNA 8.6 MG PO TABS
1.0000 | ORAL_TABLET | Freq: Two times a day (BID) | ORAL | Status: DC
  Administered 2021-02-22 – 2021-02-27 (×6): 8.6 mg via ORAL
  Filled 2021-02-22 (×9): qty 1

## 2021-02-22 MED ORDER — ONDANSETRON HCL 4 MG PO TABS: 4.0000 mg | ORAL_TABLET | Freq: Four times a day (QID) | ORAL | Status: DC | PRN

## 2021-02-22 MED ORDER — UMECLIDINIUM BROMIDE 62.5 MCG/INH IN AEPB
1.0000 | INHALATION_SPRAY | Freq: Every day | RESPIRATORY_TRACT | Status: DC
  Administered 2021-02-23 – 2021-02-27 (×5): 1 via RESPIRATORY_TRACT
  Filled 2021-02-22: qty 7

## 2021-02-22 MED ORDER — METHOCARBAMOL 500 MG PO TABS: 500.0000 mg | ORAL_TABLET | Freq: Four times a day (QID) | ORAL | Status: DC | PRN

## 2021-02-22 MED ORDER — ENOXAPARIN SODIUM 80 MG/0.8ML IJ SOSY: 1.0000 mg/kg | PREFILLED_SYRINGE | Freq: Two times a day (BID) | INTRAMUSCULAR | Status: DC

## 2021-02-22 MED ORDER — SODIUM CHLORIDE 0.9 % IV SOLN
2.0000 g | Freq: Three times a day (TID) | INTRAVENOUS | Status: DC
  Administered 2021-02-22 – 2021-02-23 (×4): 2 g via INTRAVENOUS
  Filled 2021-02-22 (×5): qty 2

## 2021-02-22 MED ORDER — MOMETASONE FURO-FORMOTEROL FUM 200-5 MCG/ACT IN AERO
2.0000 | INHALATION_SPRAY | Freq: Two times a day (BID) | RESPIRATORY_TRACT | Status: DC
  Administered 2021-02-22 – 2021-02-27 (×10): 2 via RESPIRATORY_TRACT
  Filled 2021-02-22: qty 8.8

## 2021-02-22 MED ORDER — ACETAMINOPHEN 325 MG PO TABS: 650.0000 mg | ORAL_TABLET | Freq: Four times a day (QID) | ORAL | Status: DC | PRN

## 2021-02-22 MED ORDER — MIDODRINE HCL 5 MG PO TABS
10.0000 mg | ORAL_TABLET | Freq: Three times a day (TID) | ORAL | Status: DC
  Administered 2021-02-23 – 2021-02-27 (×15): 10 mg via ORAL
  Filled 2021-02-22 (×16): qty 2

## 2021-02-22 MED ORDER — VANCOMYCIN HCL 1500 MG/300ML IV SOLN
1500.0000 mg | Freq: Once | INTRAVENOUS | Status: AC
  Administered 2021-02-22: 1500 mg via INTRAVENOUS
  Filled 2021-02-22: qty 300

## 2021-02-22 MED ORDER — ONDANSETRON HCL 4 MG/2ML IJ SOLN: 4.0000 mg | Freq: Four times a day (QID) | INTRAMUSCULAR | Status: DC | PRN

## 2021-02-22 MED ORDER — INSULIN GLARGINE 100 UNIT/ML ~~LOC~~ SOLN
25.0000 [IU] | Freq: Two times a day (BID) | SUBCUTANEOUS | Status: DC
  Administered 2021-02-22 – 2021-02-25 (×7): 25 [IU] via SUBCUTANEOUS
  Filled 2021-02-22 (×9): qty 0.25

## 2021-02-22 MED ORDER — SALINE SPRAY 0.65 % NA SOLN
2.0000 | NASAL | Status: DC | PRN
  Filled 2021-02-22: qty 44

## 2021-02-22 MED ORDER — HYDRALAZINE HCL 20 MG/ML IJ SOLN: 10.0000 mg | INTRAMUSCULAR | Status: DC | PRN

## 2021-02-22 MED ORDER — BISACODYL 5 MG PO TBEC: 5.0000 mg | DELAYED_RELEASE_TABLET | Freq: Every day | ORAL | Status: DC | PRN

## 2021-02-22 MED ORDER — DEXAMETHASONE 2 MG PO TABS
1.0000 mg | ORAL_TABLET | Freq: Every day | ORAL | Status: DC
  Administered 2021-02-23 – 2021-02-27 (×5): 1 mg via ORAL
  Filled 2021-02-22 (×5): qty 1

## 2021-02-22 MED ORDER — ACETAMINOPHEN 650 MG RE SUPP: 650.0000 mg | Freq: Four times a day (QID) | RECTAL | Status: DC | PRN

## 2021-02-22 MED ORDER — GUAIFENESIN 100 MG/5ML PO SOLN: 5.0000 mL | ORAL | Status: DC | PRN

## 2021-02-22 MED ORDER — HYDROCODONE-ACETAMINOPHEN 5-325 MG PO TABS: 1.0000 | ORAL_TABLET | ORAL | Status: DC | PRN

## 2021-02-22 MED ORDER — TAMSULOSIN HCL 0.4 MG PO CAPS
0.4000 mg | ORAL_CAPSULE | Freq: Every day | ORAL | Status: DC
  Administered 2021-02-23 – 2021-02-27 (×5): 0.4 mg via ORAL
  Filled 2021-02-22 (×5): qty 1

## 2021-02-22 MED ORDER — FUROSEMIDE 20 MG PO TABS
20.0000 mg | ORAL_TABLET | Freq: Every day | ORAL | Status: DC
  Administered 2021-02-23 – 2021-02-27 (×5): 20 mg via ORAL
  Filled 2021-02-22 (×6): qty 1

## 2021-02-22 MED ORDER — INSULIN ASPART 100 UNIT/ML IJ SOLN
6.0000 [IU] | Freq: Three times a day (TID) | INTRAMUSCULAR | Status: DC
  Administered 2021-02-23 – 2021-02-26 (×9): 6 [IU] via SUBCUTANEOUS
  Filled 2021-02-22: qty 0.06

## 2021-02-22 MED ORDER — PERFLUTREN LIPID MICROSPHERE
1.0000 mL | INTRAVENOUS | Status: AC | PRN
  Administered 2021-02-22: 2 mL via INTRAVENOUS
  Filled 2021-02-22: qty 10

## 2021-02-22 MED ORDER — SENNOSIDES-DOCUSATE SODIUM 8.6-50 MG PO TABS: 1.0000 | ORAL_TABLET | Freq: Every evening | ORAL | Status: DC | PRN

## 2021-02-22 MED ORDER — IPRATROPIUM-ALBUTEROL 0.5-2.5 (3) MG/3ML IN SOLN: 3.0000 mL | RESPIRATORY_TRACT | Status: DC | PRN

## 2021-02-22 MED ORDER — ENOXAPARIN SODIUM 80 MG/0.8ML IJ SOSY
1.0000 mg/kg | PREFILLED_SYRINGE | Freq: Two times a day (BID) | INTRAMUSCULAR | Status: DC
  Administered 2021-02-22 – 2021-02-25 (×6): 80 mg via SUBCUTANEOUS
  Filled 2021-02-22 (×6): qty 0.8

## 2021-02-22 MED ORDER — VANCOMYCIN HCL 750 MG/150ML IV SOLN
750.0000 mg | Freq: Two times a day (BID) | INTRAVENOUS | Status: DC
  Administered 2021-02-23 – 2021-02-26 (×7): 750 mg via INTRAVENOUS
  Filled 2021-02-22 (×8): qty 150

## 2021-02-22 MED ORDER — PROCHLORPERAZINE MALEATE 10 MG PO TABS: 10.0000 mg | ORAL_TABLET | Freq: Four times a day (QID) | ORAL | Status: DC | PRN

## 2021-02-22 MED ORDER — ATORVASTATIN CALCIUM 20 MG PO TABS
20.0000 mg | ORAL_TABLET | Freq: Every day | ORAL | Status: DC
  Administered 2021-02-22 – 2021-02-27 (×6): 20 mg via ORAL
  Filled 2021-02-22 (×5): qty 1

## 2021-02-22 NOTE — ED Notes (Signed)
Patient provided with warm blankets per request

## 2021-02-22 NOTE — ED Notes (Signed)
Portable ultrasound to bedside at this time

## 2021-02-22 NOTE — Progress Notes (Signed)
Pharmacy Antibiotic Note  Dennis [name]. is a 60 y.o. male with PMH polysubstance abuse and metastatic lung CA admitted on 02/22/2021 for incidental finding of PE on outpatient CT. Echo done in ED shows possible vegetations on tricuspid valve and device lead.  Pharmacy has been consulted for vancomycin and cefepime dosing for endocarditis.   Plan:  Vancomycin 1500 mg IV now, then 750 mg IV q12 hr (est AUC 455 based on SCr 1.39; Vd 0.72)  Measure vancomycin AUC at steady state as indicated  SCr q48 while on vanc  Cefepime 2 g IV q8 hr  Height: 6' (182.9 cm) Weight: 79.6 kg (175 lb 6.4 oz) IBW/kg (Calculated) : 77.6  Temp (24hrs), Avg:98.3 F (36.8 C), Min:98.3 F (36.8 C), Max:98.3 F (36.8 C)  Recent Labs  Lab 02/21/21 1452 02/22/21 1442  WBC 10.4 7.5  CREATININE 1.52* 1.39*    Estimated Creatinine Clearance: 63.6 mL/min (A) (by C-G formula based on SCr of 1.39 mg/dL (H)).    No Known Allergies  Antimicrobials this admission: 5/13 vancomycin >>  5/13 cefepime >>   Dose adjustments this admission: n/a  Microbiology results: 5/13 BCx: sent 5/13 UCx: sent   Thank you for allowing pharmacy to be a part of this patient's care.  Rosmery Duggin A 02/22/2021 6:01 PM

## 2021-02-22 NOTE — Plan of Care (Signed)
Discussed briefly with [facility] Attending Dr. Reesa Chew. 60 yo M with history of polysubstance abuse, HFrEF, and metastatic cancer.  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 Presently, there are concerns about his [place] and if he would be medical stable for interventions for endocarditis (surgery/angiovac/lead extraction).  Because of this TEE may not add to care. Patient to be seen and assessed at [facility]; if would be stable for above may need [facility] transfer.  Werner Lean, MD

## 2021-02-22 NOTE — ED Triage Notes (Signed)
Patient's brother reports that the patient had a CT scan and they were called to come to the ED because the patient had several PE's. Patient is currently receiving chemo for lung cancer.  Patient is currently on home O2 2L/min at rest and O2 3L/min when active. Patient 's brother states increased SOB x 2-3 weeks.

## 2021-02-22 NOTE — ED Notes (Signed)
Patient provided with water per request

## 2021-02-22 NOTE — ED Notes (Signed)
Patient provided with urinal, informed of need for urine specimen

## 2021-02-22 NOTE — ED Notes (Signed)
Patient provided with dinner tray at this time. 

## 2021-02-22 NOTE — ED Provider Notes (Signed)
Dennis [name]. is a 60 y.o. male, presenting to the ED with pulmonary embolism.  HPI from Nuala Alpha, PA-C: "Dennis [name]. is a 60 y.o. male history of metastatic lung cancer presents today for pulmonary embolism.  Patient had a staging CT scan chest abdomen pelvis yesterday which revealed new pulmonary embolism bilaterally.  Patient had previously been on warfarin and Xarelto for several years but had been taken off at beginning of April.  Patient reports that he noticed some shortness of breath beginning around 2 weeks ago, he is his baseline O2 2 L nasal cannula.  Patient denies any associated chest pain.  He reports shortness of breath is worse with activity improves with rest and increased oxygen.  Denies fever chills falls injuries chest pain hemoptysis abdominal pain nausea vomiting or any additional concerns"   Past Medical History:  Diagnosis Date  . Alcohol abuse with alcohol-induced mood disorder ([place]) 08/18/2015  . Automatic implantable cardioverter-defibrillator in situ 2012   S/P St Jude Dual Chamber. ICD.  Marland Kitchen CAD (coronary artery disease) 11/16/2014   CAD Coronary angiography (12/2008) with mid LAD totally occluded and collateralized.  . Cardiac LV ejection fraction 10-20%   . Chest pain 09/04/2018  . CHF (congestive heart failure) ([place])   . Chronic systolic heart failure (HCC)    a) Mixed ICM/NICM b) RHC (05/2014): RA 2, RV 19/2/3, PA 22/14 (18), PCWP 6, Fick CO/CI: 5.2 / 2.7, PVR 2.3 WU, PA 60% and 64% c) ECHO (05/2014): EF 20-25%, diff HK, akinesis entireanteroseptal myocardium, triv AI, mod MR, LA mod/sev dilated  . Cocaine abuse ([place]) 01/24/2015  . Cocaine abuse with cocaine-induced mood disorder ([place]) 08/20/2015  . Drug abuse and dependence ([place])   . Dysphagia following cerebral infarction 01/24/2015  . Embolic stroke involving right middle cerebral artery ([place])   . Extensive stage primary small cell carcinoma of lung ([place]) 01/04/2020  . H/O noncompliance with medical  treatment, presenting hazards to health 01/24/2015  . Heart murmur   . High cholesterol   . Ischemic cardiomyopathy    a) Coronary angiography (12/2008) at [facility]: [place]: nl, LAD mid 100% stenosis with left to left and right-to-left collaterals to the distal LAD; Lcx: nl, RCA nl.    . 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[place])   . MDD (major depressive disorder), recurrent severe, without psychosis ([place]) 08/18/2015  . Open wound of foot 02/27/2015  . Pneumonia 1988  . Psychoactive substance-induced mood disorder ([place]) 07/17/2015  . Sleep apnea    "cleared after T&A"  . Status post PICC central line placement   . Stroke ([place])   . Substance induced mood disorder ([place]) 08/17/2015  . Type II diabetes mellitus (HCC)     Physical Exam  BP 108/80   Pulse 93   Temp 98.3 F (36.8 C) (Oral)   Resp (!) 21   Ht 6' (1.829 m)   Wt 79.6 kg   SpO2 98%   BMI 23.79 kg/m   Physical Exam Vitals and nursing note reviewed.  Constitutional:      General: He is not in acute distress.    Appearance: He is well-developed. He is not diaphoretic.  HENT:     Head: Normocephalic and atraumatic.     Mouth/Throat:     Mouth: Mucous membranes are moist.     Pharynx: Oropharynx is clear.  Eyes:     Conjunctiva/sclera: Conjunctivae normal.  Cardiovascular:     Rate and Rhythm: Normal rate and regular rhythm.     Pulses:  Normal pulses.          Radial pulses are 2+ on the right side and 2+ on the left side.       Posterior tibial pulses are 2+ on the right side and 2+ on the left side.     Heart sounds: Normal heart sounds.     Comments: Tactile temperature in the extremities appropriate and equal bilaterally. Pulmonary:     Effort: Pulmonary effort is normal. No respiratory distress.     Breath sounds: Normal breath sounds.     Comments: Adequate SPO2 on patient's home 2 L/min.  No orthopnea.  No increased work of breathing. Abdominal:     Palpations: Abdomen is soft.     Tenderness: There is no abdominal  tenderness. There is no guarding.  Musculoskeletal:     Cervical back: Neck supple.     Right lower leg: No edema.     Left lower leg: No edema.  Skin:    General: Skin is warm and dry.  Neurological:     Mental Status: He is alert.  Psychiatric:        Mood and Affect: Mood and affect normal.        Speech: Speech normal.        Behavior: Behavior normal.     ED Course/Procedures   Clinical Course as of 02/22/21 1737  Fri Feb 22, 2021  1636 Dr. Aundra Dubin, cardiologist, evaluated and interpreted the patient's echo.  Points out tricuspid and ICD lead vegetations.  Recommends blood cultures, antibiotics, admission.  Hospitalist will need to formally consult cardiology. [SJ]  [place] with Dr. Reesa Chew, hospitalist.  Agrees to admit the patient. [SJ]    Clinical Course User Index [SJ] Arnie Maiolo C, PA-C    .Critical Care E&M Performed by: Lorayne Bender, PA-C  Critical care provider statement:    Critical care time (minutes):  45   Critical care time was exclusive of:  Separately billable procedures and treating other patients   Critical care was necessary to treat or prevent imminent or life-threatening deterioration of the following conditions: Pulmonary emboli and endocarditis.   Critical care was time spent personally by me on the following activities:  Discussions with consultants, pulse oximetry, re-evaluation of patient's condition, obtaining history from patient or surrogate, examination of patient, evaluation of patient's response to treatment and development of treatment plan with patient or surrogate (Review of labs and imaging studies)   I assumed direction of critical care for this patient from another provider in my specialty: yes     Care discussed with: admitting provider   After initial E/M assessment, critical care services were subsequently performed that were exclusive of separately billable procedures or treatment.       Abnormal Labs Reviewed  CBC - Abnormal;  Notable for the following components:      Result Value   RBC 3.74 (*)    Hemoglobin 10.9 (*)    HCT 34.5 (*)    RDW 17.9 (*)    Platelets 128 (*)    All other components within normal limits  BRAIN NATRIURETIC PEPTIDE - Abnormal; Notable for the following components:   B Natriuretic Peptide 408.7 (*)    All other components within normal limits  BASIC METABOLIC PANEL - Abnormal; Notable for the following components:   Sodium 134 (*)    Chloride 93 (*)    CO2 34 (*)    Glucose, Bld 337 (*)    BUN 32 (*)  Creatinine, Ser 1.39 (*)    GFR, Estimated 59 (*)    All other components within normal limits  TROPONIN I (HIGH SENSITIVITY) - Abnormal; Notable for the following components:   Troponin I (High Sensitivity) 23 (*)    All other components within normal limits    CT Chest W Contrast  Result Date: 02/22/2021 CLINICAL DATA:  Primary Cancer Type: Lung Imaging Indication: Assess response to therapy Interval therapy since last imaging? Yes Initial Cancer Diagnosis Date: 12/28/2019; Established by: Presumed Detailed Pathology: Extensive stage small cell lung cancer. Primary Tumor location: Right middle lobe.  Brain metastasis. Surgeries: ICD.  Cholecystectomy. Chemotherapy: Yes; Ongoing? Yes; Most recent administration: 02/05/2021 Immunotherapy?  Yes; Type: Imfinzi; Ongoing? No Radiation therapy? Yes; Date Range: 01/04/2020 - 01/17/2020; Target: Brain EXAM: CT CHEST, ABDOMEN, AND PELVIS WITH CONTRAST TECHNIQUE: Multidetector CT imaging of the chest, abdomen and pelvis was performed following the standard protocol during bolus administration of intravenous contrast. CONTRAST:  21mL OMNIPAQUE IOHEXOL 300 MG/ML  SOLN COMPARISON:  Most recent CT chest, abdomen and pelvis 12/24/2020. 01/17/2020 PET-CT. FINDINGS: CT CHEST FINDINGS Cardiovascular: Left chest multi lead pacer defibrillator. Right chest port catheter. Aortic atherosclerosis. Global cardiomegaly. Three-vessel coronary artery  calcifications and/or stents. No pericardial effusion. Incidental note of new bilateral pulmonary embolism, noted in the distal right pulmonary artery and more distal vessels as well as in the lobar to segmental vessels of the left lower lobe. Mediastinum/Nodes: Interval decrease in size of previously enlarged pretracheal, subcarinal, and right hilar lymph nodes, largest pretracheal node measuring 2.0 x 1.0 cm, previously 2.3 x 1.8 cm when measured similarly (series 3, image 25). Thyroid gland, trachea, and esophagus demonstrate no significant findings. Lungs/Pleura: Moderate centrilobular emphysema. Diffuse bilateral bronchial wall thickening. Interval resolution of previously seen small bilateral pleural effusions. Interval decrease in size of a spiculated nodule of the posterior lateral segment right middle lobe abutting the major fissure, measuring 1.3 x 0.7 cm previously 1.6 x 1.0 cm when measured similarly (series 8, image 102). Musculoskeletal: No chest wall mass or suspicious bone lesions identified. CT ABDOMEN PELVIS FINDINGS Hepatobiliary: No focal liver abnormality is seen. Status post cholecystectomy. No biliary dilatation. Pancreas: Unremarkable. No pancreatic ductal dilatation or surrounding inflammatory changes. Spleen: Normal in size without significant abnormality. Adrenals/Urinary Tract: Stable, definitively benign right adrenal adenoma (series 3, image 60). Punctuate nonobstructive calculus of the inferior pole of the right kidney. No ureteral calculus or hydronephrosis. Thickening of the urinary bladder wall. Stomach/Bowel: Stomach is within normal limits. Appendix appears normal. No evidence of bowel wall thickening, distention, or inflammatory changes. Vascular/Lymphatic: Aortic atherosclerosis. No enlarged abdominal or pelvic lymph nodes. Reproductive: No mass or other abnormality. Other: No abdominal wall hernia or abnormality. No abdominopelvic ascites. Musculoskeletal: No acute or  significant osseous findings. IMPRESSION: 1. Interval decrease in size of a spiculated nodule of the posterior lateral segment right middle lobe. 2. Interval decrease in size of previously enlarged pretracheal, subcarinal, and right hilar lymph nodes. 3. Interval resolution of previously seen small bilateral pleural effusions. 4. Findings are consistent with treatment response. 5. Incidental note of new bilateral pulmonary embolism, noted in the distal right pulmonary artery and more distal vessels as well as in the lobar to segmental vessels of the left lower lobe. 6. No evidence of metastatic disease in the abdomen or pelvis. 7. Nonobstructive right nephrolithiasis. 8. New thickening of the urinary bladder wall, which may be due to nonspecific infectious or inflammatory cystitis. Correlate with urinalysis. 9. Emphysema. 10. Cardiomegaly and coronary  artery disease. Call report request to [place] was placed at the time of interpretation. Final communication will be documented. Aortic Atherosclerosis (ICD10-I70.0) and Emphysema (ICD10-J43.9). Electronically Signed   By: Eddie Candle M.D.   On: 02/22/2021 11:11   CT Abdomen Pelvis W Contrast  Result Date: 02/22/2021 CLINICAL DATA:  Primary Cancer Type: Lung Imaging Indication: Assess response to therapy Interval therapy since last imaging? Yes Initial Cancer Diagnosis Date: 12/28/2019; Established by: Presumed Detailed Pathology: Extensive stage small cell lung cancer. Primary Tumor location: Right middle lobe.  Brain metastasis. Surgeries: ICD.  Cholecystectomy. Chemotherapy: Yes; Ongoing? Yes; Most recent administration: 02/05/2021 Immunotherapy?  Yes; Type: Imfinzi; Ongoing? No Radiation therapy? Yes; Date Range: 01/04/2020 - 01/17/2020; Target: Brain EXAM: CT CHEST, ABDOMEN, AND PELVIS WITH CONTRAST TECHNIQUE: Multidetector CT imaging of the chest, abdomen and pelvis was performed following the standard protocol during bolus administration  of intravenous contrast. CONTRAST:  1mL OMNIPAQUE IOHEXOL 300 MG/ML  SOLN COMPARISON:  Most recent CT chest, abdomen and pelvis 12/24/2020. 01/17/2020 PET-CT. FINDINGS: CT CHEST FINDINGS Cardiovascular: Left chest multi lead pacer defibrillator. Right chest port catheter. Aortic atherosclerosis. Global cardiomegaly. Three-vessel coronary artery calcifications and/or stents. No pericardial effusion. Incidental note of new bilateral pulmonary embolism, noted in the distal right pulmonary artery and more distal vessels as well as in the lobar to segmental vessels of the left lower lobe. Mediastinum/Nodes: Interval decrease in size of previously enlarged pretracheal, subcarinal, and right hilar lymph nodes, largest pretracheal node measuring 2.0 x 1.0 cm, previously 2.3 x 1.8 cm when measured similarly (series 3, image 25). Thyroid gland, trachea, and esophagus demonstrate no significant findings. Lungs/Pleura: Moderate centrilobular emphysema. Diffuse bilateral bronchial wall thickening. Interval resolution of previously seen small bilateral pleural effusions. Interval decrease in size of a spiculated nodule of the posterior lateral segment right middle lobe abutting the major fissure, measuring 1.3 x 0.7 cm previously 1.6 x 1.0 cm when measured similarly (series 8, image 102). Musculoskeletal: No chest wall mass or suspicious bone lesions identified. CT ABDOMEN PELVIS FINDINGS Hepatobiliary: No focal liver abnormality is seen. Status post cholecystectomy. No biliary dilatation. Pancreas: Unremarkable. No pancreatic ductal dilatation or surrounding inflammatory changes. Spleen: Normal in size without significant abnormality. Adrenals/Urinary Tract: Stable, definitively benign right adrenal adenoma (series 3, image 60). Punctuate nonobstructive calculus of the inferior pole of the right kidney. No ureteral calculus or hydronephrosis. Thickening of the urinary bladder wall. Stomach/Bowel: Stomach is within normal  limits. Appendix appears normal. No evidence of bowel wall thickening, distention, or inflammatory changes. Vascular/Lymphatic: Aortic atherosclerosis. No enlarged abdominal or pelvic lymph nodes. Reproductive: No mass or other abnormality. Other: No abdominal wall hernia or abnormality. No abdominopelvic ascites. Musculoskeletal: No acute or significant osseous findings. IMPRESSION: 1. Interval decrease in size of a spiculated nodule of the posterior lateral segment right middle lobe. 2. Interval decrease in size of previously enlarged pretracheal, subcarinal, and right hilar lymph nodes. 3. Interval resolution of previously seen small bilateral pleural effusions. 4. Findings are consistent with treatment response. 5. Incidental note of new bilateral pulmonary embolism, noted in the distal right pulmonary artery and more distal vessels as well as in the lobar to segmental vessels of the left lower lobe. 6. No evidence of metastatic disease in the abdomen or pelvis. 7. Nonobstructive right nephrolithiasis. 8. New thickening of the urinary bladder wall, which may be due to nonspecific infectious or inflammatory cystitis. Correlate with urinalysis. 9. Emphysema. 10. Cardiomegaly and coronary artery disease. Call report request to [place]  [facility] was placed at the time of interpretation. Final communication will be documented. Aortic Atherosclerosis (ICD10-I70.0) and Emphysema (ICD10-J43.9). Electronically Signed   By: Eddie Candle M.D.   On: 02/22/2021 11:11   ECHOCARDIOGRAM COMPLETE  Result Date: 02/22/2021    ECHOCARDIOGRAM REPORT   Patient Name:   Dennis [name]. Date of Exam: 02/22/2021 Medical Rec #:  [id number]       Height:       72.0 in Accession #:    [id number]      Weight:       175.4 lb Date of Birth:  [birth year]        BSA:          2.015 m Patient Age:    43 years        BP:           108/80 mmHg Patient Gender: M               HR:           93 bpm. Exam Location:  Inpatient Procedure: 2D  Echo, Color Doppler and Cardiac Doppler Indications:    Pulmonary embolus  History:        Patient has prior history of Echocardiogram examinations, most                 recent 12/26/2020. CHF, CAD; Risk Factors:Diabetes.  Sonographer:    Cammy Brochure Referring Phys: [phone] [place]  1. Left ventricular ejection fraction, by estimation, is <20%. The left ventricle has severely decreased function. The left ventricle demonstrates global hypokinesis. The left ventricular internal cavity size was mildly dilated. There is mild left ventricular hypertrophy. Left ventricular diastolic parameters are indeterminate.  2. Right ventricular systolic function is moderately reduced. The right ventricular size is mildly enlarged. Tricuspid regurgitation signal is inadequate for assessing PA pressure.  3. Left atrial size was mildly dilated.  4. The mitral valve is normal in structure. Trivial mitral valve regurgitation. No evidence of mitral stenosis.  5. The tricuspid valve is abnormal. There is a 2.4 x 1.4 cm vegetation that appears to be attached to the tricuspid valve as well as vegetation attached to the ICD lead as it transits the tricuspid valve. The vegetation is not completely visualized on this study, but I think this looks more like endocarditis than a sterile thrombus in transit. Would followup with TEE.  6. The aortic valve is tricuspid. Aortic valve regurgitation is trivial.  7. Aortic dilatation noted. There is mild dilatation of the aortic root, measuring 37 mm.  8. The inferior vena cava is normal in size with greater than 50% respiratory variability, suggesting right atrial pressure of 3 mmHg. FINDINGS  Left Ventricle: Left ventricular ejection fraction, by estimation, is <20%. The left ventricle has severely decreased function. The left ventricle demonstrates global hypokinesis. The left ventricular internal cavity size was mildly dilated. There is mild left ventricular hypertrophy. Left  ventricular diastolic parameters are indeterminate. Right Ventricle: The right ventricular size is mildly enlarged. No increase in right ventricular wall thickness. Right ventricular systolic function is moderately reduced. Tricuspid regurgitation signal is inadequate for assessing PA pressure. Left Atrium: Left atrial size was mildly dilated. Right Atrium: Right atrial size was normal in size. Pericardium: There is no evidence of pericardial effusion. Mitral Valve: The mitral valve is normal in structure. Trivial mitral valve regurgitation. No evidence of mitral valve stenosis. Tricuspid Valve: The tricuspid valve is abnormal. Tricuspid valve regurgitation is trivial.  Aortic Valve: The aortic valve is tricuspid. Aortic valve regurgitation is trivial. Aortic valve mean gradient measures 2.0 mmHg. Aortic valve peak gradient measures 3.3 mmHg. Aortic valve area, by VTI measures 2.18 cm. Pulmonic Valve: The pulmonic valve was normal in structure. Pulmonic valve regurgitation is not visualized. Aorta: Aortic dilatation noted. There is mild dilatation of the aortic root, measuring 37 mm. Venous: The inferior vena cava is normal in size with greater than 50% respiratory variability, suggesting right atrial pressure of 3 mmHg. IAS/Shunts: No atrial level shunt detected by color flow Doppler. Additional Comments: A device lead is visualized in the right ventricle.  LEFT VENTRICLE PLAX 2D LVIDd:         6.09 cm LVIDs:         5.42 cm LV PW:         1.31 cm LV IVS:        1.31 cm LVOT diam:     2.40 cm LV SV:         27 LV SV Index:   13 LVOT Area:     4.52 cm  RIGHT VENTRICLE RV Basal diam:  4.60 cm LEFT ATRIUM             Index       RIGHT ATRIUM           Index LA diam:        2.50 cm 1.24 cm/m  RA Area:     15.60 cm LA Vol (A2C):   49.2 ml 24.39 ml/m RA Volume:   39.00 ml  19.36 ml/m LA Vol (A4C):   54.5 ml 27.05 ml/m LA Biplane Vol: 58.8 ml 29.18 ml/m  AORTIC VALVE AV Area (Vmax):    2.22 cm AV Area (Vmean):    2.45 cm AV Area (VTI):     2.18 cm AV Vmax:           90.50 cm/s AV Vmean:          60.600 cm/s AV VTI:            0.124 m AV Peak Grad:      3.3 mmHg AV Mean Grad:      2.0 mmHg LVOT Vmax:         44.40 cm/s LVOT Vmean:        32.800 cm/s LVOT VTI:          0.060 m LVOT/AV VTI ratio: 0.48  AORTA Ao Root diam: 3.70 cm Ao Asc diam:  3.60 cm MITRAL VALVE MV Area (PHT): 6.17 cm     SHUNTS MV Decel Time: 123 msec     Systemic VTI:  0.06 m MV E velocity: 101.00 cm/s  Systemic Diam: 2.40 cm Loralie Champagne MD Electronically signed by Loralie Champagne MD Signature Date/Time: 02/22/2021/4:32:59 PM    Final     MDM   Clinical Course as of 02/22/21 1737  Fri Feb 22, 2021  1636 Dr. Aundra Dubin, cardiologist, evaluated and interpreted the patient's echo.  Points out tricuspid and ICD lead vegetations.  Recommends blood cultures, antibiotics, admission.  Hospitalist will need to formally consult cardiology. [SJ]  [place] with Dr. Reesa Chew, hospitalist.  Agrees to admit the patient. [SJ]    Clinical Course User Index [SJ] Lorayne Bender, PA-C    Patient care handoff report received from [facility], [place]. Plan: Review the rest of the patient's labs and admit the patient.  Echo also pending.  Patient presents with PEs noted on CT.  Due to an apparent heparin shortage, patient was started on Lovenox. Echo showed some dilation of the right ventricle.  It also showed suspected vegetations on tricuspid valve and ICD lead. Blood cultures obtained.  Orders for antibiotics placed with request for pharmacist to dose.  Findings and plan of care discussed with attending physician, Daleen Bo, MD. Dr. Eulis Foster personally evaluated and examined this patient.   Vitals:   02/22/21 1405 02/22/21 1406 02/22/21 1430 02/22/21 1500  BP: 102/78  (!) 122/98 108/80  Pulse: (!) 109  97 93  Resp: 16  15 (!) 21  Temp: 98.3 F (36.8 C)     TempSrc: Oral     SpO2: 100%  97% 98%  Weight:  79.6 kg    Height:  6' (1.829 m)           Lorayne Bender, PA-C 02/22/21 1741    Daleen Bo, MD 02/22/21 2314

## 2021-02-22 NOTE — H&P (Signed)
History and Physical    Dennis [name]. SMO:[id number] DOB: 01/12/1961 DOA: 02/22/2021  PCP: [name], MD Patient coming from: Sent by his oncologist from home  Chief Complaint: Shortness of breath and pulmonary embolism  HPI: Dennis [name]. is a 60 y.o. male with medical history significant of metastatic small cell lung cancer on chemotherapy, systolic CHF EF 92% status post AICD, history of cocaine use, HTN, recent CVA, HLD, DM2, lower extremity DVT used to be on Xarelto, chronic hypoxia on 2 L nasal cannula comes to the hospital as he was directed by his oncologist for evaluation of pulmonary embolism. Patient has been on outpatient chemo treatment for his metastatic small lung cell cancer who has been feeling short of breath over the past 2 weeks.  He had routine CT scan of his chest yesterday which showed bilateral pulmonary embolism today therefore sent to the ER.  In the ER he is on baseline 2 L nasal cannula but has exertional shortness of breath.  Stat echo in the ER revealed EF less than 20% but vegetation seen on tricuspid extended out of his atrium on his ICD lead.  Blood cultures were done and started on antibiotics.  He was also started on Lovenox 1 mg/kg twice daily.  Medical team was requested to admit the patient.  When I saw the patient he was comfortable did not have any complaints at rest.  Denied any fevers, chills and any other complaints at home besides exertional shortness of breath.  Social history- denies any tobacco, alcohol and illicit drug use Family history-DM2  Review of Systems: As per HPI otherwise 10 point review of systems negative.  Review of Systems Otherwise negative except as per HPI, including: General: Denies fever, chills, night sweats or unintended weight loss. Resp: Denies cough, wheezing Cardiac: Denies chest pain, palpitations, orthopnea, paroxysmal nocturnal dyspnea. GI: Denies abdominal pain, nausea, vomiting, diarrhea or constipation GU:  Denies dysuria, frequency, hesitancy or incontinence MS: Denies muscle aches, joint pain or swelling Neuro: Denies headache, neurologic deficits (focal weakness, numbness, tingling), abnormal gait Psych: Denies anxiety, depression, SI/HI/AVH Skin: Denies new rashes or lesions ID: Denies sick contacts, exotic exposures, travel  Past Medical History:  Diagnosis Date  . Alcohol abuse with alcohol-induced mood disorder ([place]) 08/18/2015  . Automatic implantable cardioverter-defibrillator in situ 2012   S/P St Jude Dual Chamber. ICD.  Marland Kitchen CAD (coronary artery disease) 11/16/2014   CAD Coronary angiography (12/2008) with mid LAD totally occluded and collateralized.  . Cardiac LV ejection fraction 10-20%   . Chest pain 09/04/2018  . CHF (congestive heart failure) ([place])   . Chronic systolic heart failure (HCC)    a) Mixed ICM/NICM b) RHC (05/2014): RA 2, RV 19/2/3, PA 22/14 (18), PCWP 6, Fick CO/CI: 5.2 / 2.7, PVR 2.3 WU, PA 60% and 64% c) ECHO (05/2014): EF 20-25%, diff HK, akinesis entireanteroseptal myocardium, triv AI, mod MR, LA mod/sev dilated  . Cocaine abuse ([place]) 01/24/2015  . Cocaine abuse with cocaine-induced mood disorder ([place]) 08/20/2015  . Drug abuse and dependence ([place])   . Dysphagia following cerebral infarction 01/24/2015  . Embolic stroke involving right middle cerebral artery ([place])   . Extensive stage primary small cell carcinoma of lung ([place]) 01/04/2020  . H/O noncompliance with medical treatment, presenting hazards to health 01/24/2015  . Heart murmur   . High cholesterol   . Ischemic cardiomyopathy    a) Coronary angiography (12/2008) at [facility]: [place]: nl, LAD mid 100% stenosis with left to left and  right-to-left collaterals to the distal LAD; L[place]: nl, RCA nl.    . 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[place])   . MDD (major depressive disorder), recurrent severe, without psychosis ([place]) 08/18/2015  . Open wound of foot 02/27/2015  . Pneumonia 1988  . Psychoactive substance-induced mood  disorder ([place]) 07/17/2015  . Sleep apnea    "cleared after T&A"  . Status post PICC central line placement   . Stroke ([place])   . Substance induced mood disorder ([place]) 08/17/2015  . Type II diabetes mellitus ([place])     Past Surgical History:  Procedure Laterality Date  . BRONCHIAL BRUSHINGS  12/28/2019   Procedure: BRONCHIAL BRUSHINGS;  Surgeon: Garner Nash, DO;  Location: [facility];  Service: Thoracic;;  . BRONCHIAL NEEDLE ASPIRATION BIOPSY  12/28/2019   Procedure: BRONCHIAL NEEDLE ASPIRATION BIOPSIES;  Surgeon: Garner Nash, DO;  Location: [facility];  Service: Thoracic;;  . BRONCHIAL WASHINGS  12/28/2019   Procedure: BRONCHIAL WASHINGS;  Surgeon: Garner Nash, DO;  Location: [facility];  Service: Thoracic;;  . CARDIAC CATHETERIZATION  05/2014  . CARDIAC DEFIBRILLATOR PLACEMENT  03/2011  . CHOLECYSTECTOMY  1994  . FEMUR IM NAIL Left 10/31/2020   Procedure: INTRAMEDULLARY (IM) RETROGRADE FEMORAL NAILING;  Surgeon: Shona Needles, MD;  Location: [facility];  Service: Orthopedics;  Laterality: Left;  . FOREARM FRACTURE SURGERY Left 1980  . FRACTURE SURGERY    . IR IMAGING GUIDED PORT INSERTION  10/11/2020  . RIGHT HEART CATHETERIZATION N/A 05/30/2014   Procedure: RIGHT HEART CATH;  Surgeon: Jolaine Artist, MD;  Location[facility] CATH LAB;  Service: Cardiovascular;  Laterality: N/A;  . RIGHT HEART CATHETERIZATION N/A 02/07/2015   Procedure: RIGHT HEART CATH;  Surgeon: Jolaine Artist, MD;  Location: [facility] CATH LAB;  Service: Cardiovascular;  Laterality: N/A;  . TONSILLECTOMY AND ADENOIDECTOMY  ~ 1998  . VIDEO BRONCHOSCOPY WITH ENDOBRONCHIAL ULTRASOUND N/A 12/28/2019   Procedure: VIDEO BRONCHOSCOPY WITH ENDOBRONCHIAL ULTRASOUND;  Surgeon: Garner Nash, DO;  Location: [facility];  Service: Thoracic;  Laterality: N/A;    SOCIAL HISTORY:  reports that he has quit smoking. His smoking use included cigarettes. He has a 7.75 pack-year smoking history. He has never used smokeless  tobacco. He reports previous alcohol use. He reports current drug use. Drug: Cocaine.  No Known Allergies  FAMILY HISTORY: Family History  Problem Relation Age of Onset  . Diabetes Mother   . Heart disease Mother   . Diabetes Father   . Prostate cancer Father   . Heart disease Father   . Diabetes Brother   . Diabetes Brother      Prior to Admission medications   Medication Sig Start Date End Date Taking? Authorizing Provider  Accu-Chek Softclix Lancets lancets  02/03/20   [provider]  atorvastatin (LIPITOR) 20 MG tablet TAKE 1 TABLET BY MOUTH DAILY AT 6 PM. 03/11/17   Bensimhon, Shaune Pascal, MD  Blood Glucose Monitoring Suppl (FIFTY50 GLUCOSE METER 2.0) w/Device KIT Use as instructed 02/03/20   [provider]  budesonide-formoterol (SYMBICORT) 160-4.5 MCG/ACT inhaler Inhale 2 puffs into the lungs 2 (two) times daily. 12/29/20   [place] Eric J, DO  dexamethasone (DECADRON) 1 MG tablet Take 1 tablet (1 mg total) by mouth daily. 12/18/20   Ventura Sellers, MD  feeding supplement (ENSURE ENLIVE / ENSURE PLUS) LIQD Take 237 mLs by mouth 2 (two) times daily between meals. 07/26/20   Eugenie Filler, MD  furosemide (LASIX) 20 MG tablet Take 20 mg by  mouth daily.    [provider]  insulin glargine (LANTUS SOLOSTAR) 100 UNIT/ML Solostar Pen Inject 25 Units into the skin 2 (two) times daily. Patient taking differently: Inject 25 Units into the skin in the morning and at bedtime. 07/25/20   Eugenie Filler, MD  insulin lispro (HUMALOG KWIKPEN) 100 UNIT/ML KwikPen Inject 6 Units into the skin 3 (three) times daily. Patient taking differently: Inject 6 Units into the skin 2 (two) times daily. 07/25/20   Eugenie Filler, MD  INSUPEN ULTRAFIN 31G X 8 MM [place]  03/02/20   [provider]  methocarbamol (ROBAXIN) 500 MG tablet Take 1 tablet (500 mg total) by mouth every 6 (six) hours as needed for muscle spasms. 11/04/20   Bonnielee Haff, MD  midodrine  (PROAMATINE) 10 MG tablet Take 1 tablet (10 mg total) by mouth 3 (three) times daily with meals. Patient taking differently: Take 10 mg by mouth daily. 07/25/20   Eugenie Filler, MD  potassium chloride SA (KLOR-CON) 20 MEQ tablet Take 1 tablet (20 mEq total) by mouth daily. 01/22/21   Lyda Jester M, PA-C  prochlorperazine (COMPAZINE) 10 MG tablet Take 10 mg by mouth every 6 (six) hours as needed for nausea or vomiting.    [provider]  rivaroxaban (XARELTO) 20 MG TABS tablet Take 20 mg by mouth daily with supper.    [provider]  senna (SENOKOT) 8.6 MG TABS tablet Take 1 tablet (8.6 mg total) by mouth 2 (two) times daily. Patient taking differently: Take 1 tablet by mouth every other day. 11/04/20   Bonnielee Haff, MD  tamsulosin (FLOMAX) 0.4 MG CAPS capsule Take 0.4 mg by mouth daily. 10/22/20   [provider]  tiotropium (SPIRIVA HANDIHALER) 18 MCG inhalation capsule Place 1 capsule (18 mcg total) into inhaler and inhale daily. 12/29/20 12/29/21  [place] Eric J, DO  Vitamin D3 (VITAMIN D) 25 MCG tablet Take 2 tablets (2,000 Units total) by mouth 2 (two) times daily. 11/02/20   Delray Alt, PA-C    Physical Exam: Vitals:   02/22/21 1600 02/22/21 1630 02/22/21 1700 02/22/21 1730  BP: 109/81 117/86 111/84 109/79  Pulse: 94 95 95 93  Resp: _0 Temp:      TempSrc:      SpO2: 99% 93% 96% 100%  Weight:      Height:          Constitutional: NAD, calm, comfortable, 2 L nasal cannula Eyes: PERRL, lids and conjunctivae normal ENMT: Mucous membranes are moist. Posterior pharynx clear of any exudate or lesions.Normal dentition.  Neck: normal, supple, no masses, no thyromegaly Respiratory: clear to auscultation bilaterally, no wheezing, no crackles. Normal respiratory effort. No accessory muscle use.  Cardiovascular: Regular rate and rhythm, no murmurs / rubs / gallops. No extremity edema. 2+ pedal pulses. No carotid bruits.  Abdomen: no  tenderness, no masses palpated. No hepatosplenomegaly. Bowel sounds positive.  Musculoskeletal: no clubbing / cyanosis. No joint deformity upper and lower extremities. Good ROM, no contractures. Normal muscle tone.  Skin: no rashes, lesions, ulcers. No induration Neurologic: CN 2-12 grossly intact. Sensation intact, DTR normal. Strength 5/5 in all 4.  Psychiatric: Normal judgment and insight. Alert and oriented x 3. Normal mood.   Right chest wall Chemo-Port in place  Labs on Admission: I have personally reviewed following labs and imaging studies  CBC: Recent Labs  Lab 02/21/21 1452 02/22/21 1442  WBC 10.4 7.5  NEUTROABS 7.6  --  HGB 11.5* 10.9*  HCT 36.2* 34.5*  MCV 91.2 92.2  PLT 84* 166*   Basic Metabolic Panel: Recent Labs  Lab 02/21/21 1452 02/22/21 1442  NA 137 134*  K 3.8 3.6  CL 93* 93*  CO2 32 34*  GLUCOSE 239* 337*  BUN 29* 32*  CREATININE 1.52* 1.39*  CALCIUM 9.5 9.0   GFR: Estimated Creatinine Clearance: 63.6 mL/min (A) (by C-G formula based on SCr of 1.39 mg/dL (H)). Liver Function Tests: Recent Labs  Lab 02/21/21 1452  AST 21  ALT 19  ALKPHOS 190*  BILITOT 0.5  PROT 6.7  ALBUMIN 3.2*   No results for input(s): LIPASE, AMYLASE in the last 168 hours. No results for input(s): AMMONIA in the last 168 hours. Coagulation Profile: Recent Labs  Lab 02/22/21 1442  INR 1.1   Cardiac Enzymes: No results for input(s): CKTOTAL, CKMB, CKMBINDEX, TROPONINI in the last 168 hours. BNP (last 3 results) No results for input(s): PROBNP in the last 8760 hours. HbA1C: No results for input(s): HGBA1C in the last 72 hours. CBG: No results for input(s): GLUCAP in the last 168 hours. Lipid Profile: No results for input(s): CHOL, HDL, LDLCALC, TRIG, CHOLHDL, LDLDIRECT in the last 72 hours. Thyroid Function Tests: No results for input(s): TSH, T4TOTAL, FREET4, T3FREE, THYROIDAB in the last 72 hours. Anemia Panel: No results for input(s): VITAMINB12, FOLATE,  FERRITIN, TIBC, IRON, RETICCTPCT in the last 72 hours. Urine analysis:    Component Value Date/Time   COLORURINE YELLOW 02/22/2021 1628   APPEARANCEUR CLOUDY (A) 02/22/2021 1628   LABSPEC 1.019 02/22/2021 1628   PHURINE 5.0 02/22/2021 1628   GLUCOSEU 150 (A) 02/22/2021 1628   HGBUR SMALL (A) 02/22/2021 1628   BILIRUBINUR NEGATIVE 02/22/2021 1628   KETONESUR NEGATIVE 02/22/2021 1628   PROTEINUR 100 (A) 02/22/2021 1628   UROBILINOGEN 1.0 07/16/2015 1715   NITRITE POSITIVE (A) 02/22/2021 1628   LEUKOCYTESUR LARGE (A) 02/22/2021 1628   Sepsis Labs: !!!!!!!!!!!!!!!!!!!!!!!!!!!!!!!!!!!!!!!!!!!! _0 (procalcitonin:4,lacticidven:4) ) Recent Results (from the past 240 hour(s))  Resp Panel by RT-PCR (Flu A&B, Covid) Nasopharyngeal Swab     Status: None   Collection Time: 02/22/21  2:42 PM   Specimen: Nasopharyngeal Swab; Nasopharyngeal(NP) swabs in vial transport medium  Result Value Ref Range Status   SARS Coronavirus 2 by RT PCR NEGATIVE NEGATIVE Final    Comment: (NOTE) SARS-CoV-2 target nucleic acids are NOT DETECTED.  The SARS-CoV-2 RNA is generally detectable in upper respiratory specimens during the acute phase of infection. The lowest concentration of SARS-CoV-2 viral copies this assay can detect is 138 copies/mL. A negative result does not preclude SARS-Cov-2 infection and should not be used as the sole basis for treatment or other patient management decisions. A negative result may occur with  improper specimen collection/handling, submission of specimen other than nasopharyngeal swab, presence of viral mutation(s) within the areas targeted by this assay, and inadequate number of viral copies(<138 copies/mL). A negative result must be combined with clinical observations, patient history, and epidemiological information. The expected result is Negative.  Fact Sheet for Patients:  EntrepreneurPulse.com.au  Fact Sheet for Healthcare Providers:   IncredibleEmployment.be  This test is no t yet approved or cleared by the [place] FDA and  has been authorized for detection and/or diagnosis of SARS-CoV-2 by FDA under an Emergency Use Authorization (EUA). This EUA will remain  in effect (meaning this test can be used) for the duration of the COVID-19 declaration under Section 564(b)(1) of the Act, 21 U.S.C.section 360bbb-3(b)(1), unless the authorization  is terminated  or revoked sooner.       Influenza A by PCR NEGATIVE NEGATIVE Final   Influenza B by PCR NEGATIVE NEGATIVE Final    Comment: (NOTE) The Xpert Xpress SARS-CoV-2/FLU/RSV plus assay is intended as an aid in the diagnosis of influenza from Nasopharyngeal swab specimens and should not be used as a sole basis for treatment. Nasal washings and aspirates are unacceptable for Xpert Xpress SARS-CoV-2/FLU/RSV testing.  Fact Sheet for Patients: EntrepreneurPulse.com.au  Fact Sheet for Healthcare Providers: IncredibleEmployment.be  This test is not yet approved or cleared by the [place] FDA and has been authorized for detection and/or diagnosis of SARS-CoV-2 by FDA under an Emergency Use Authorization (EUA). This EUA will remain in effect (meaning this test can be used) for the duration of the COVID-19 declaration under Section 564(b)(1) of the Act, 21 U.S.C. section 360bbb-3(b)(1), unless the authorization is terminated or revoked.  Performed at [facility], [place] [address], [place], [place] [address]      Radiological Exams on Admission: CT Chest W Contrast  Result Date: 02/22/2021 CLINICAL DATA:  Primary Cancer Type: Lung Imaging Indication: Assess response to therapy Interval therapy since last imaging? Yes Initial Cancer Diagnosis Date: 12/28/2019; Established by: Presumed Detailed Pathology: Extensive stage small cell lung cancer. Primary Tumor location: Right middle lobe.  Brain  metastasis. Surgeries: ICD.  Cholecystectomy. Chemotherapy: Yes; Ongoing? Yes; Most recent administration: 02/05/2021 Immunotherapy?  Yes; Type: Imfinzi; Ongoing? No Radiation therapy? Yes; Date Range: 01/04/2020 - 01/17/2020; Target: Brain EXAM: CT CHEST, ABDOMEN, AND PELVIS WITH CONTRAST TECHNIQUE: Multidetector CT imaging of the chest, abdomen and pelvis was performed following the standard protocol during bolus administration of intravenous contrast. CONTRAST:  77m OMNIPAQUE IOHEXOL 300 MG/ML  SOLN COMPARISON:  Most recent CT chest, abdomen and pelvis 12/24/2020. 01/17/2020 PET-CT. FINDINGS: CT CHEST FINDINGS Cardiovascular: Left chest multi lead pacer defibrillator. Right chest port catheter. Aortic atherosclerosis. Global cardiomegaly. Three-vessel coronary artery calcifications and/or stents. No pericardial effusion. Incidental note of new bilateral pulmonary embolism, noted in the distal right pulmonary artery and more distal vessels as well as in the lobar to segmental vessels of the left lower lobe. Mediastinum/Nodes: Interval decrease in size of previously enlarged pretracheal, subcarinal, and right hilar lymph nodes, largest pretracheal node measuring 2.0 x 1.0 cm, previously 2.3 x 1.8 cm when measured similarly (series 3, image 25). Thyroid gland, trachea, and esophagus demonstrate no significant findings. Lungs/Pleura: Moderate centrilobular emphysema. Diffuse bilateral bronchial wall thickening. Interval resolution of previously seen small bilateral pleural effusions. Interval decrease in size of a spiculated nodule of the posterior lateral segment right middle lobe abutting the major fissure, measuring 1.3 x 0.7 cm previously 1.6 x 1.0 cm when measured similarly (series 8, image 102). Musculoskeletal: No chest wall mass or suspicious bone lesions identified. CT ABDOMEN PELVIS FINDINGS Hepatobiliary: No focal liver abnormality is seen. Status post cholecystectomy. No biliary dilatation. Pancreas:  Unremarkable. No pancreatic ductal dilatation or surrounding inflammatory changes. Spleen: Normal in size without significant abnormality. Adrenals/Urinary Tract: Stable, definitively benign right adrenal adenoma (series 3, image 60). Punctuate nonobstructive calculus of the inferior pole of the right kidney. No ureteral calculus or hydronephrosis. Thickening of the urinary bladder wall. Stomach/Bowel: Stomach is within normal limits. Appendix appears normal. No evidence of bowel wall thickening, distention, or inflammatory changes. Vascular/Lymphatic: Aortic atherosclerosis. No enlarged abdominal or pelvic lymph nodes. Reproductive: No mass or other abnormality. Other: No abdominal wall hernia or abnormality. No abdominopelvic ascites. Musculoskeletal: No acute or significant  osseous findings. IMPRESSION: 1. Interval decrease in size of a spiculated nodule of the posterior lateral segment right middle lobe. 2. Interval decrease in size of previously enlarged pretracheal, subcarinal, and right hilar lymph nodes. 3. Interval resolution of previously seen small bilateral pleural effusions. 4. Findings are consistent with treatment response. 5. Incidental note of new bilateral pulmonary embolism, noted in the distal right pulmonary artery and more distal vessels as well as in the lobar to segmental vessels of the left lower lobe. 6. No evidence of metastatic disease in the abdomen or pelvis. 7. Nonobstructive right nephrolithiasis. 8. New thickening of the urinary bladder wall, which may be due to nonspecific infectious or inflammatory cystitis. Correlate with urinalysis. 9. Emphysema. 10. Cardiomegaly and coronary artery disease. Call report request to [place] was placed at the time of interpretation. Final communication will be documented. Aortic Atherosclerosis (ICD10-I70.0) and Emphysema (ICD10-J43.9). Electronically Signed   By: Eddie Candle M.D.   On: 02/22/2021 11:11   CT Abdomen Pelvis W  Contrast  Result Date: 02/22/2021 CLINICAL DATA:  Primary Cancer Type: Lung Imaging Indication: Assess response to therapy Interval therapy since last imaging? Yes Initial Cancer Diagnosis Date: 12/28/2019; Established by: Presumed Detailed Pathology: Extensive stage small cell lung cancer. Primary Tumor location: Right middle lobe.  Brain metastasis. Surgeries: ICD.  Cholecystectomy. Chemotherapy: Yes; Ongoing? Yes; Most recent administration: 02/05/2021 Immunotherapy?  Yes; Type: Imfinzi; Ongoing? No Radiation therapy? Yes; Date Range: 01/04/2020 - 01/17/2020; Target: Brain EXAM: CT CHEST, ABDOMEN, AND PELVIS WITH CONTRAST TECHNIQUE: Multidetector CT imaging of the chest, abdomen and pelvis was performed following the standard protocol during bolus administration of intravenous contrast. CONTRAST:  62m OMNIPAQUE IOHEXOL 300 MG/ML  SOLN COMPARISON:  Most recent CT chest, abdomen and pelvis 12/24/2020. 01/17/2020 PET-CT. FINDINGS: CT CHEST FINDINGS Cardiovascular: Left chest multi lead pacer defibrillator. Right chest port catheter. Aortic atherosclerosis. Global cardiomegaly. Three-vessel coronary artery calcifications and/or stents. No pericardial effusion. Incidental note of new bilateral pulmonary embolism, noted in the distal right pulmonary artery and more distal vessels as well as in the lobar to segmental vessels of the left lower lobe. Mediastinum/Nodes: Interval decrease in size of previously enlarged pretracheal, subcarinal, and right hilar lymph nodes, largest pretracheal node measuring 2.0 x 1.0 cm, previously 2.3 x 1.8 cm when measured similarly (series 3, image 25). Thyroid gland, trachea, and esophagus demonstrate no significant findings. Lungs/Pleura: Moderate centrilobular emphysema. Diffuse bilateral bronchial wall thickening. Interval resolution of previously seen small bilateral pleural effusions. Interval decrease in size of a spiculated nodule of the posterior lateral segment right middle  lobe abutting the major fissure, measuring 1.3 x 0.7 cm previously 1.6 x 1.0 cm when measured similarly (series 8, image 102). Musculoskeletal: No chest wall mass or suspicious bone lesions identified. CT ABDOMEN PELVIS FINDINGS Hepatobiliary: No focal liver abnormality is seen. Status post cholecystectomy. No biliary dilatation. Pancreas: Unremarkable. No pancreatic ductal dilatation or surrounding inflammatory changes. Spleen: Normal in size without significant abnormality. Adrenals/Urinary Tract: Stable, definitively benign right adrenal adenoma (series 3, image 60). Punctuate nonobstructive calculus of the inferior pole of the right kidney. No ureteral calculus or hydronephrosis. Thickening of the urinary bladder wall. Stomach/Bowel: Stomach is within normal limits. Appendix appears normal. No evidence of bowel wall thickening, distention, or inflammatory changes. Vascular/Lymphatic: Aortic atherosclerosis. No enlarged abdominal or pelvic lymph nodes. Reproductive: No mass or other abnormality. Other: No abdominal wall hernia or abnormality. No abdominopelvic ascites. Musculoskeletal: No acute or significant osseous findings. IMPRESSION: 1. Interval decrease in size  of a spiculated nodule of the posterior lateral segment right middle lobe. 2. Interval decrease in size of previously enlarged pretracheal, subcarinal, and right hilar lymph nodes. 3. Interval resolution of previously seen small bilateral pleural effusions. 4. Findings are consistent with treatment response. 5. Incidental note of new bilateral pulmonary embolism, noted in the distal right pulmonary artery and more distal vessels as well as in the lobar to segmental vessels of the left lower lobe. 6. No evidence of metastatic disease in the abdomen or pelvis. 7. Nonobstructive right nephrolithiasis. 8. New thickening of the urinary bladder wall, which may be due to nonspecific infectious or inflammatory cystitis. Correlate with urinalysis. 9.  Emphysema. 10. Cardiomegaly and coronary artery disease. Call report request to [place] was placed at the time of interpretation. Final communication will be documented. Aortic Atherosclerosis (ICD10-I70.0) and Emphysema (ICD10-J43.9). Electronically Signed   By: Eddie Candle M.D.   On: 02/22/2021 11:11   ECHOCARDIOGRAM COMPLETE  Result Date: 02/22/2021    ECHOCARDIOGRAM REPORT   Patient Name:   Dennis [name]. Date of Exam: 02/22/2021 Medical Rec #:  [id number]       Height:       72.0 in Accession #:    [id number]      Weight:       175.4 lb Date of Birth:  Sep 01, 1961        BSA:          2.015 m Patient Age:    24 years        BP:           108/80 mmHg Patient Gender: M               HR:           93 bpm. Exam Location:  Inpatient Procedure: 2D Echo, Color Doppler and Cardiac Doppler Indications:    Pulmonary embolus  History:        Patient has prior history of Echocardiogram examinations, most                 recent 12/26/2020. CHF, CAD; Risk Factors:Diabetes.  Sonographer:    Cammy Brochure Referring Phys: [phone] [place]  1. Left ventricular ejection fraction, by estimation, is <20%. The left ventricle has severely decreased function. The left ventricle demonstrates global hypokinesis. The left ventricular internal cavity size was mildly dilated. There is mild left ventricular hypertrophy. Left ventricular diastolic parameters are indeterminate.  2. Right ventricular systolic function is moderately reduced. The right ventricular size is mildly enlarged. Tricuspid regurgitation signal is inadequate for assessing PA pressure.  3. Left atrial size was mildly dilated.  4. The mitral valve is normal in structure. Trivial mitral valve regurgitation. No evidence of mitral stenosis.  5. The tricuspid valve is abnormal. There is a 2.4 x 1.4 cm vegetation that appears to be attached to the tricuspid valve as well as vegetation attached to the ICD lead as it transits the tricuspid  valve. The vegetation is not completely visualized on this study, but I think this looks more like endocarditis than a sterile thrombus in transit. Would followup with TEE.  6. The aortic valve is tricuspid. Aortic valve regurgitation is trivial.  7. Aortic dilatation noted. There is mild dilatation of the aortic root, measuring 37 mm.  8. The inferior vena cava is normal in size with greater than 50% respiratory variability, suggesting right atrial pressure of 3 mmHg. FINDINGS  Left Ventricle: Left ventricular ejection  fraction, by estimation, is <20%. The left ventricle has severely decreased function. The left ventricle demonstrates global hypokinesis. The left ventricular internal cavity size was mildly dilated. There is mild left ventricular hypertrophy. Left ventricular diastolic parameters are indeterminate. Right Ventricle: The right ventricular size is mildly enlarged. No increase in right ventricular wall thickness. Right ventricular systolic function is moderately reduced. Tricuspid regurgitation signal is inadequate for assessing PA pressure. Left Atrium: Left atrial size was mildly dilated. Right Atrium: Right atrial size was normal in size. Pericardium: There is no evidence of pericardial effusion. Mitral Valve: The mitral valve is normal in structure. Trivial mitral valve regurgitation. No evidence of mitral valve stenosis. Tricuspid Valve: The tricuspid valve is abnormal. Tricuspid valve regurgitation is trivial. Aortic Valve: The aortic valve is tricuspid. Aortic valve regurgitation is trivial. Aortic valve mean gradient measures 2.0 mmHg. Aortic valve peak gradient measures 3.3 mmHg. Aortic valve area, by VTI measures 2.18 cm. Pulmonic Valve: The pulmonic valve was normal in structure. Pulmonic valve regurgitation is not visualized. Aorta: Aortic dilatation noted. There is mild dilatation of the aortic root, measuring 37 mm. Venous: The inferior vena cava is normal in size with greater than 50%  respiratory variability, suggesting right atrial pressure of 3 mmHg. IAS/Shunts: No atrial level shunt detected by color flow Doppler. Additional Comments: A device lead is visualized in the right ventricle.  LEFT VENTRICLE PLAX 2D LVIDd:         6.09 cm LVIDs:         5.42 cm LV PW:         1.31 cm LV IVS:        1.31 cm LVOT diam:     2.40 cm LV SV:         27 LV SV Index:   13 LVOT Area:     4.52 cm  RIGHT VENTRICLE RV Basal diam:  4.60 cm LEFT ATRIUM             Index       RIGHT ATRIUM           Index LA diam:        2.50 cm 1.24 cm/m  RA Area:     15.60 cm LA Vol (A2C):   49.2 ml 24.39 ml/m RA Volume:   39.00 ml  19.36 ml/m LA Vol (A4C):   54.5 ml 27.05 ml/m LA Biplane Vol: 58.8 ml 29.18 ml/m  AORTIC VALVE AV Area (Vmax):    2.22 cm AV Area (Vmean):   2.45 cm AV Area (VTI):     2.18 cm AV Vmax:           90.50 cm/s AV Vmean:          60.600 cm/s AV VTI:            0.124 m AV Peak Grad:      3.3 mmHg AV Mean Grad:      2.0 mmHg LVOT Vmax:         44.40 cm/s LVOT Vmean:        32.800 cm/s LVOT VTI:          0.060 m LVOT/AV VTI ratio: 0.48  AORTA Ao Root diam: 3.70 cm Ao Asc diam:  3.60 cm MITRAL VALVE MV Area (PHT): 6.17 cm     SHUNTS MV Decel Time: 123 msec     Systemic VTI:  0.06 m MV E velocity: 101.00 cm/s  Systemic Diam: 2.40 cm Loralie Champagne MD Electronically signed  by Loralie Champagne MD Signature Date/Time: 02/22/2021/4:32:59 PM    Final      All images have been reviewed by me personally.    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  I have discussed this case with cardiology.  Patient is high risk for TEE at this time therefore cultures have been drawn and started on IV vancomycin and cefepime. - Wonder if this is secondary to his Chemo-Port that has been in place for about 6 to 7 months. -Cardiology team consulted- Dr Gasper Sells, ok with admitting at [facility] for now. But if needed will tx to [facility].  - We will consult infectious disease tomorrow morning.  Bilateral pulmonary embolism without cor pulmonale History of DVT - Suspect if this is septic emboli.  Continue antibiotics, Lovenox 1 mg/kg every 12 hours - Lower extremity Dopplers ordered -Tells me he has been off Xarelto for 6 weeks- was taken off by one of his providers  Metastatic small cell lung cancer - Follows outpatient [name].  On outpatient chemotherapy.  Daily dexamethasone.  Urinary tract infection without hematuria - Urine cultures ordered - Already on antibiotics  Chronic congestive heart failure reduced ejection fraction, EF 15% status post ICD - Not on any home medications because of hypotension.  Continue midodrine -Supportive care.  Lasix 20 mg daily  CKD stage IIIa - Chronic.  Creatinine at baseline of 1.3  Diabetes mellitus type 2 - Continue Lantus 25 units twice daily.  Insulin sliding scale and Accu-Chek - Lispro 6 units 3 times Premeal's  Chronic hypoxia on 2 L nasal cannula History of COPD/emphysema - Bronchodilators  Hyperlipidemia - Lipitor  Goals of care discussion-I briefly discussed his comorbidities and acute issues.  Patient at this time would like full scope of treatment.  He understands he is a high risk candidate for any procedures.  Unfortunately patient has overall poor prognosis  DVT prophylaxis: Lovenox 1 mg/kg Code Status: Full code Family Communication: None Consults called: Cardiology Admission status:  Inpatient admission  Status is: Inpatient  Remains inpatient appropriate because:Inpatient level of care appropriate due to severity of illness   Dispo: The patient is from: Home              Anticipated d/c is to: Home              Patient currently is not medically stable to d/c.   Difficult to place patient No       Time Spent: 65 minutes.  >50% of the time was devoted to discussing the patients care, assessment, plan and disposition with other care givers along with counseling the patient about the risks and benefits of treatment.    Aviv Rota Arsenio Loader MD [facility]  If 7PM-7AM, please contact night-coverage   02/22/2021, 5:39 PM

## 2021-02-22 NOTE — ED Provider Notes (Signed)
[facility] DEPT Provider Note   CSN: [id number] Arrival date & time: 02/22/21  1327     History Chief Complaint  Patient presents with  . pulmonary embolus    Dennis [name]. is a 60 y.o. male history of metastatic lung cancer presents today for pulmonary embolism.  Patient had a staging CT scan chest abdomen pelvis yesterday which revealed new pulmonary embolism bilaterally.  Patient had previously been on warfarin and Xarelto for several years but had been taken off at beginning of April.  Patient reports that he noticed some shortness of breath beginning around 2 weeks ago, he is his baseline O2 2 L nasal cannula.  Patient denies any associated chest pain.  He reports shortness of breath is worse with activity improves with rest and increased oxygen.  Denies fever chills falls injuries chest pain hemoptysis abdominal pain nausea vomiting or any additional concerns HPI     Past Medical History:  Diagnosis Date  . Alcohol abuse with alcohol-induced mood disorder ([place]) 08/18/2015  . Automatic implantable cardioverter-defibrillator in situ 2012   S/P St Jude Dual Chamber. ICD.  Marland Kitchen CAD (coronary artery disease) 11/16/2014   CAD Coronary angiography (12/2008) with mid LAD totally occluded and collateralized.  . Cardiac LV ejection fraction 10-20%   . Chest pain 09/04/2018  . CHF (congestive heart failure) ([place])   . Chronic systolic heart failure (HCC)    a) Mixed ICM/NICM b) RHC (05/2014): RA 2, RV 19/2/3, PA 22/14 (18), PCWP 6, Fick CO/CI: 5.2 / 2.7, PVR 2.3 WU, PA 60% and 64% c) ECHO (05/2014): EF 20-25%, diff HK, akinesis entireanteroseptal myocardium, triv AI, mod MR, LA mod/sev dilated  . Cocaine abuse ([place]) 01/24/2015  . Cocaine abuse with cocaine-induced mood disorder ([place]) 08/20/2015  . Drug abuse and dependence ([place])   . Dysphagia following cerebral infarction 01/24/2015  . Embolic stroke involving right middle cerebral artery ([place])   . Extensive  stage primary small cell carcinoma of lung ([place]) 01/04/2020  . H/O noncompliance with medical treatment, presenting hazards to health 01/24/2015  . Heart murmur   . High cholesterol   . Ischemic cardiomyopathy    a) Coronary angiography (12/2008) at [facility]: [place]: nl, LAD mid 100% stenosis with left to left and right-to-left collaterals to the distal LAD; Lcx: nl, RCA nl.    . 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[place])   . MDD (major depressive disorder), recurrent severe, without psychosis ([place]) 08/18/2015  . Open wound of foot 02/27/2015  . Pneumonia 1988  . Psychoactive substance-induced mood disorder ([place]) 07/17/2015  . Sleep apnea    "cleared after T&A"  . Status post PICC central line placement   . Stroke ([place])   . Substance induced mood disorder ([place]) 08/17/2015  . Type II diabetes mellitus [facility])     Patient Active Problem List   Diagnosis Date Noted  . Port-A-Cath in place 02/05/2021  . Hypokalemia 01/16/2021  . Acute exacerbation of CHF (congestive heart failure) ([place]) 12/25/2020  . Femoral distal fracture ([place]) 10/30/2020  . Fall 10/30/2020  . COVID-19 virus infection 10/30/2020  . Brain metastases ([place]) 08/27/2020  . Orthostatic hypotension   . Syncope 07/09/2020  . Slurred speech 07/08/2020  . Uncontrolled type 2 diabetes mellitus with hyperglycemia, with long-term current use of insulin ([place]) 07/08/2020  . Unsteady gait when walking 07/07/2020  . Small cell lung cancer, right upper lobe ([place]) 01/19/2020  . Encounter for antineoplastic chemotherapy 01/19/2020  . Encounter for antineoplastic immunotherapy 01/19/2020  .  Extensive stage primary small cell carcinoma of lung ([place]) 01/04/2020  . Goals of care, counseling/discussion 01/04/2020  . Mediastinal adenopathy   . Mass of upper lobe of right lung   . Brain mass 12/23/2019  . Vasogenic cerebral edema ([place]) 12/23/2019  . Left leg paresthesias   . History of CVA with residual deficit   . Acute on chronic systolic congestive  heart failure ([place])   . Left-sided weakness   . 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[place])   . Cocaine abuse ([place]) 01/24/2015  . Essential hypertension 01/24/2015  . Dysphagia 01/24/2015  . Left hemiplegia ([place]) 01/24/2015  . Hyperlipidemia associated with type 2 diabetes mellitus ([place]) 11/30/2014  . Chronic systolic heart failure ([place]) 06/20/2014  . Automatic implantable cardioverter-defibrillator in situ   . Drug abuse and dependence ([place])   . Cardiomyopathy, ischemic 07/21/2011  . Type II diabetes mellitus with ophthalmic manifestations ([place]) 09/13/2007    Past Surgical History:  Procedure Laterality Date  . BRONCHIAL BRUSHINGS  12/28/2019   Procedure: BRONCHIAL BRUSHINGS;  Surgeon: Garner Nash, DO;  Location: [facility];  Service: Thoracic;;  . BRONCHIAL NEEDLE ASPIRATION BIOPSY  12/28/2019   Procedure: BRONCHIAL NEEDLE ASPIRATION BIOPSIES;  Surgeon: Garner Nash, DO;  Location: [facility];  Service: Thoracic;;  . BRONCHIAL WASHINGS  12/28/2019   Procedure: BRONCHIAL WASHINGS;  Surgeon: Garner Nash, DO;  Location: [facility];  Service: Thoracic;;  . CARDIAC CATHETERIZATION  05/2014  . CARDIAC DEFIBRILLATOR PLACEMENT  03/2011  . CHOLECYSTECTOMY  1994  . FEMUR IM NAIL Left 10/31/2020   Procedure: INTRAMEDULLARY (IM) RETROGRADE FEMORAL NAILING;  Surgeon: Shona Needles, MD;  Location: [facility];  Service: Orthopedics;  Laterality: Left;  . FOREARM FRACTURE SURGERY Left 1980  . FRACTURE SURGERY    . IR IMAGING GUIDED PORT INSERTION  10/11/2020  . RIGHT HEART CATHETERIZATION N/A 05/30/2014   Procedure: RIGHT HEART CATH;  Surgeon: Jolaine Artist, MD;  Location[facility] CATH LAB;  Service: Cardiovascular;  Laterality: N/A;  . RIGHT HEART CATHETERIZATION N/A 02/07/2015   Procedure: RIGHT HEART CATH;  Surgeon: Jolaine Artist, MD;  Location: [facility] CATH LAB;  Service: Cardiovascular;  Laterality: N/A;  . TONSILLECTOMY AND ADENOIDECTOMY  ~ 1998  . VIDEO BRONCHOSCOPY WITH  ENDOBRONCHIAL ULTRASOUND N/A 12/28/2019   Procedure: VIDEO BRONCHOSCOPY WITH ENDOBRONCHIAL ULTRASOUND;  Surgeon: Garner Nash, DO;  Location: [facility];  Service: Thoracic;  Laterality: N/A;       Family History  Problem Relation Age of Onset  . Diabetes Mother   . Heart disease Mother   . Diabetes Father   . Prostate cancer Father   . Heart disease Father   . Diabetes Brother   . Diabetes Brother     Social History   Tobacco Use  . Smoking status: Former Smoker    Packs/day: 0.25    Years: 31.00    Pack years: 7.75    Types: Cigarettes  . Smokeless tobacco: Never Used  Vaping Use  . Vaping Use: Never used  Substance Use Topics  . Alcohol use: Not Currently    Alcohol/week: 0.0 standard drinks    Comment: 09/21/2014 "might have a drink q 6 /months, if that"  . Drug use: Yes    Types: Cocaine    Comment: last use 07/06/2020    Home Medications Prior to Admission medications   Medication Sig Start Date End Date Taking? Authorizing Provider  Accu-Chek Softclix Lancets lancets  02/03/20   [provider]  atorvastatin (LIPITOR)  20 MG tablet TAKE 1 TABLET BY MOUTH DAILY AT 6 PM. 03/11/17   Bensimhon, Shaune Pascal, MD  Blood Glucose Monitoring Suppl (FIFTY50 GLUCOSE METER 2.0) w/Device KIT Use as instructed 02/03/20   [provider]  budesonide-formoterol (SYMBICORT) 160-4.5 MCG/ACT inhaler Inhale 2 puffs into the lungs 2 (two) times daily. 12/29/20   [place] Eric J, DO  dexamethasone (DECADRON) 1 MG tablet Take 1 tablet (1 mg total) by mouth daily. 12/18/20   Ventura Sellers, MD  feeding supplement (ENSURE ENLIVE / ENSURE PLUS) LIQD Take 237 mLs by mouth 2 (two) times daily between meals. 07/26/20   Eugenie Filler, MD  furosemide (LASIX) 20 MG tablet Take 20 mg by mouth daily.    [provider]  insulin glargine (LANTUS SOLOSTAR) 100 UNIT/ML Solostar Pen Inject 25 Units into the skin 2 (two) times daily. Patient taking differently: Inject 25  Units into the skin in the morning and at bedtime. 07/25/20   Eugenie Filler, MD  insulin lispro (HUMALOG KWIKPEN) 100 UNIT/ML KwikPen Inject 6 Units into the skin 3 (three) times daily. Patient taking differently: Inject 6 Units into the skin 2 (two) times daily. 07/25/20   Eugenie Filler, MD  INSUPEN ULTRAFIN 31G X 8 MM [place]  03/02/20   [provider]  methocarbamol (ROBAXIN) 500 MG tablet Take 1 tablet (500 mg total) by mouth every 6 (six) hours as needed for muscle spasms. 11/04/20   Bonnielee Haff, MD  midodrine (PROAMATINE) 10 MG tablet Take 1 tablet (10 mg total) by mouth 3 (three) times daily with meals. Patient taking differently: Take 10 mg by mouth daily. 07/25/20   Eugenie Filler, MD  potassium chloride SA (KLOR-CON) 20 MEQ tablet Take 1 tablet (20 mEq total) by mouth daily. 01/22/21   Lyda Jester M, PA-C  prochlorperazine (COMPAZINE) 10 MG tablet Take 10 mg by mouth every 6 (six) hours as needed for nausea or vomiting.    [provider]  rivaroxaban (XARELTO) 20 MG TABS tablet Take 20 mg by mouth daily with supper.    [provider]  senna (SENOKOT) 8.6 MG TABS tablet Take 1 tablet (8.6 mg total) by mouth 2 (two) times daily. Patient taking differently: Take 1 tablet by mouth every other day. 11/04/20   Bonnielee Haff, MD  tamsulosin (FLOMAX) 0.4 MG CAPS capsule Take 0.4 mg by mouth daily. 10/22/20   [provider]  tiotropium (SPIRIVA HANDIHALER) 18 MCG inhalation capsule Place 1 capsule (18 mcg total) into inhaler and inhale daily. 12/29/20 12/29/21  [place] Eric J, DO  Vitamin D3 (VITAMIN D) 25 MCG tablet Take 2 tablets (2,000 Units total) by mouth 2 (two) times daily. 11/02/20   Delray Alt, PA-C    Allergies    Patient has no known allergies.  Review of Systems   Review of Systems Ten systems are reviewed and are negative for acute change except as noted in the HPI  Physical Exam Updated Vital Signs BP 102/78 (BP  Location: Left Arm)   Pulse (!) 109   Temp 98.3 F (36.8 C) (Oral)   Resp 16   Ht 6' (1.829 m)   Wt 79.6 kg   SpO2 100%   BMI 23.79 kg/m   Physical Exam Constitutional:      General: He is not in acute distress.    Appearance: Normal appearance. He is well-developed. He is not ill-appearing or diaphoretic.  HENT:     Head: Normocephalic and atraumatic.  Eyes:  General: Vision grossly intact. Gaze aligned appropriately.     Pupils: Pupils are equal, round, and reactive to light.  Neck:     Trachea: Trachea and phonation normal.  Cardiovascular:     Rate and Rhythm: Normal rate and regular rhythm.     Pulses: Normal pulses.     Heart sounds: Normal heart sounds.  Pulmonary:     Effort: Pulmonary effort is normal. No respiratory distress.  Abdominal:     General: There is no distension.     Palpations: Abdomen is soft.     Tenderness: There is no abdominal tenderness. There is no guarding or rebound.  Musculoskeletal:        General: Normal range of motion.     Cervical back: Normal range of motion.  Skin:    General: Skin is warm and dry.  Neurological:     Mental Status: He is alert.     GCS: GCS eye subscore is 4. GCS verbal subscore is 5. GCS motor subscore is 6.     Comments: Speech is clear and goal oriented, follows commands Major Cranial nerves without deficit, no facial droop Moves extremities without ataxia, coordination intact  Psychiatric:        Behavior: Behavior normal.     ED Results / Procedures / Treatments   Labs (all labs ordered are listed, but only abnormal results are displayed) Labs Reviewed  RESP PANEL BY RT-PCR (FLU A&B, COVID) ARPGX2  CBC  BRAIN NATRIURETIC PEPTIDE  PROTIME-INR  BASIC METABOLIC PANEL  APTT  URINALYSIS, ROUTINE W REFLEX MICROSCOPIC  TROPONIN I (HIGH SENSITIVITY)    EKG None  Radiology CT Chest W Contrast  Result Date: 02/22/2021 CLINICAL DATA:  Primary Cancer Type: Lung Imaging Indication: Assess response  to therapy Interval therapy since last imaging? Yes Initial Cancer Diagnosis Date: 12/28/2019; Established by: Presumed Detailed Pathology: Extensive stage small cell lung cancer. Primary Tumor location: Right middle lobe.  Brain metastasis. Surgeries: ICD.  Cholecystectomy. Chemotherapy: Yes; Ongoing? Yes; Most recent administration: 02/05/2021 Immunotherapy?  Yes; Type: Imfinzi; Ongoing? No Radiation therapy? Yes; Date Range: 01/04/2020 - 01/17/2020; Target: Brain EXAM: CT CHEST, ABDOMEN, AND PELVIS WITH CONTRAST TECHNIQUE: Multidetector CT imaging of the chest, abdomen and pelvis was performed following the standard protocol during bolus administration of intravenous contrast. CONTRAST:  80m OMNIPAQUE IOHEXOL 300 MG/ML  SOLN COMPARISON:  Most recent CT chest, abdomen and pelvis 12/24/2020. 01/17/2020 PET-CT. FINDINGS: CT CHEST FINDINGS Cardiovascular: Left chest multi lead pacer defibrillator. Right chest port catheter. Aortic atherosclerosis. Global cardiomegaly. Three-vessel coronary artery calcifications and/or stents. No pericardial effusion. Incidental note of new bilateral pulmonary embolism, noted in the distal right pulmonary artery and more distal vessels as well as in the lobar to segmental vessels of the left lower lobe. Mediastinum/Nodes: Interval decrease in size of previously enlarged pretracheal, subcarinal, and right hilar lymph nodes, largest pretracheal node measuring 2.0 x 1.0 cm, previously 2.3 x 1.8 cm when measured similarly (series 3, image 25). Thyroid gland, trachea, and esophagus demonstrate no significant findings. Lungs/Pleura: Moderate centrilobular emphysema. Diffuse bilateral bronchial wall thickening. Interval resolution of previously seen small bilateral pleural effusions. Interval decrease in size of a spiculated nodule of the posterior lateral segment right middle lobe abutting the major fissure, measuring 1.3 x 0.7 cm previously 1.6 x 1.0 cm when measured similarly (series 8,  image 102). Musculoskeletal: No chest wall mass or suspicious bone lesions identified. CT ABDOMEN PELVIS FINDINGS Hepatobiliary: No focal liver abnormality is seen. Status post cholecystectomy. No  biliary dilatation. Pancreas: Unremarkable. No pancreatic ductal dilatation or surrounding inflammatory changes. Spleen: Normal in size without significant abnormality. Adrenals/Urinary Tract: Stable, definitively benign right adrenal adenoma (series 3, image 60). Punctuate nonobstructive calculus of the inferior pole of the right kidney. No ureteral calculus or hydronephrosis. Thickening of the urinary bladder wall. Stomach/Bowel: Stomach is within normal limits. Appendix appears normal. No evidence of bowel wall thickening, distention, or inflammatory changes. Vascular/Lymphatic: Aortic atherosclerosis. No enlarged abdominal or pelvic lymph nodes. Reproductive: No mass or other abnormality. Other: No abdominal wall hernia or abnormality. No abdominopelvic ascites. Musculoskeletal: No acute or significant osseous findings. IMPRESSION: 1. Interval decrease in size of a spiculated nodule of the posterior lateral segment right middle lobe. 2. Interval decrease in size of previously enlarged pretracheal, subcarinal, and right hilar lymph nodes. 3. Interval resolution of previously seen small bilateral pleural effusions. 4. Findings are consistent with treatment response. 5. Incidental note of new bilateral pulmonary embolism, noted in the distal right pulmonary artery and more distal vessels as well as in the lobar to segmental vessels of the left lower lobe. 6. No evidence of metastatic disease in the abdomen or pelvis. 7. Nonobstructive right nephrolithiasis. 8. New thickening of the urinary bladder wall, which may be due to nonspecific infectious or inflammatory cystitis. Correlate with urinalysis. 9. Emphysema. 10. Cardiomegaly and coronary artery disease. Call report request to [facility] was placed at  the time of interpretation. Final communication will be documented. Aortic Atherosclerosis (ICD10-I70.0) and Emphysema (ICD10-J43.9). Electronically Signed   By: Eddie Candle M.D.   On: 02/22/2021 11:11   CT Abdomen Pelvis W Contrast  Result Date: 02/22/2021 CLINICAL DATA:  Primary Cancer Type: Lung Imaging Indication: Assess response to therapy Interval therapy since last imaging? Yes Initial Cancer Diagnosis Date: 12/28/2019; Established by: Presumed Detailed Pathology: Extensive stage small cell lung cancer. Primary Tumor location: Right middle lobe.  Brain metastasis. Surgeries: ICD.  Cholecystectomy. Chemotherapy: Yes; Ongoing? Yes; Most recent administration: 02/05/2021 Immunotherapy?  Yes; Type: Imfinzi; Ongoing? No Radiation therapy? Yes; Date Range: 01/04/2020 - 01/17/2020; Target: Brain EXAM: CT CHEST, ABDOMEN, AND PELVIS WITH CONTRAST TECHNIQUE: Multidetector CT imaging of the chest, abdomen and pelvis was performed following the standard protocol during bolus administration of intravenous contrast. CONTRAST:  36m OMNIPAQUE IOHEXOL 300 MG/ML  SOLN COMPARISON:  Most recent CT chest, abdomen and pelvis 12/24/2020. 01/17/2020 PET-CT. FINDINGS: CT CHEST FINDINGS Cardiovascular: Left chest multi lead pacer defibrillator. Right chest port catheter. Aortic atherosclerosis. Global cardiomegaly. Three-vessel coronary artery calcifications and/or stents. No pericardial effusion. Incidental note of new bilateral pulmonary embolism, noted in the distal right pulmonary artery and more distal vessels as well as in the lobar to segmental vessels of the left lower lobe. Mediastinum/Nodes: Interval decrease in size of previously enlarged pretracheal, subcarinal, and right hilar lymph nodes, largest pretracheal node measuring 2.0 x 1.0 cm, previously 2.3 x 1.8 cm when measured similarly (series 3, image 25). Thyroid gland, trachea, and esophagus demonstrate no significant findings. Lungs/Pleura: Moderate centrilobular  emphysema. Diffuse bilateral bronchial wall thickening. Interval resolution of previously seen small bilateral pleural effusions. Interval decrease in size of a spiculated nodule of the posterior lateral segment right middle lobe abutting the major fissure, measuring 1.3 x 0.7 cm previously 1.6 x 1.0 cm when measured similarly (series 8, image 102). Musculoskeletal: No chest wall mass or suspicious bone lesions identified. CT ABDOMEN PELVIS FINDINGS Hepatobiliary: No focal liver abnormality is seen. Status post cholecystectomy. No biliary dilatation. Pancreas: Unremarkable. No pancreatic ductal dilatation  or surrounding inflammatory changes. Spleen: Normal in size without significant abnormality. Adrenals/Urinary Tract: Stable, definitively benign right adrenal adenoma (series 3, image 60). Punctuate nonobstructive calculus of the inferior pole of the right kidney. No ureteral calculus or hydronephrosis. Thickening of the urinary bladder wall. Stomach/Bowel: Stomach is within normal limits. Appendix appears normal. No evidence of bowel wall thickening, distention, or inflammatory changes. Vascular/Lymphatic: Aortic atherosclerosis. No enlarged abdominal or pelvic lymph nodes. Reproductive: No mass or other abnormality. Other: No abdominal wall hernia or abnormality. No abdominopelvic ascites. Musculoskeletal: No acute or significant osseous findings. IMPRESSION: 1. Interval decrease in size of a spiculated nodule of the posterior lateral segment right middle lobe. 2. Interval decrease in size of previously enlarged pretracheal, subcarinal, and right hilar lymph nodes. 3. Interval resolution of previously seen small bilateral pleural effusions. 4. Findings are consistent with treatment response. 5. Incidental note of new bilateral pulmonary embolism, noted in the distal right pulmonary artery and more distal vessels as well as in the lobar to segmental vessels of the left lower lobe. 6. No evidence of metastatic  disease in the abdomen or pelvis. 7. Nonobstructive right nephrolithiasis. 8. New thickening of the urinary bladder wall, which may be due to nonspecific infectious or inflammatory cystitis. Correlate with urinalysis. 9. Emphysema. 10. Cardiomegaly and coronary artery disease. Call report request to [facility] was placed at the time of interpretation. Final communication will be documented. Aortic Atherosclerosis (ICD10-I70.0) and Emphysema (ICD10-J43.9). Electronically Signed   By: Eddie Candle M.D.   On: 02/22/2021 11:11    Procedures Procedures   Medications Ordered in ED Medications - No data to display  ED Course  I have reviewed the triage vital signs and the nursing notes.  Pertinent labs & imaging results that were available during my care of the patient were reviewed by me and considered in my medical decision making (see chart for details).    MDM Rules/Calculators/A&P                         Additional history obtained from: 1. Nursing notes from this visit. 2. Review of electronic medical records.   IMPRESSION: 1. Interval decrease in size of a spiculated nodule of the posterior lateral segment right middle lobe. 2. Interval decrease in size of previously enlarged pretracheal, subcarinal, and right hilar lymph nodes. 3. Interval resolution of previously seen small bilateral pleural effusions. 4. Findings are consistent with treatment response. 5. Incidental note of new bilateral pulmonary embolism, noted in the distal right pulmonary artery and more distal vessels as well as in the lobar to segmental vessels of the left lower lobe. 6. No evidence of metastatic disease in the abdomen or pelvis. 7. Nonobstructive right nephrolithiasis. 8. New thickening of the urinary bladder wall, which may be due to nonspecific infectious or inflammatory cystitis. Correlate with urinalysis. 9. Emphysema. 10. Cardiomegaly and coronary artery disease. --------------- 60 year old male  presented for concern of pulmonary embolism seen on CT scan yesterday.  Noted above.  Patient reports increased shortness of breath of the past 2 weeks and increasing his home O2 use.  He denies any chest pain.  On exam he is in no acute distress.  Vital signs stable.  Heparin per pharmacy was ordered however there is a heparin shortage at this time.  Consult with pharmacist, Dr. Marylyn Ishihara, Dr.Messick plan is now to start patient on Lovenox for treatment of pulmonary embolism.  Dr. Marylyn Ishihara has also asked that I add echocardiogram  to patient's work-up, this has been ordered.  I have also added on a urinalysis to patient's work-up given urinary bladder thickening on CT scan, patient reports some dysuria.  I ordered, reviewed and interpreted labs which include: CBC shows anemia 10.9, no leukocytosis. INR 1.1. ========================== On reevaluation patient in no acute distress, vital signs remained stable.  Care handoff given to Arlean Hopping, PA-C at shift change.  Plan of care is to follow-up on pending labs and call hospitalist team for admission.    Note: Portions of this report may have been transcribed using voice recognition software. Every effort was made to ensure accuracy; however, inadvertent computerized transcription errors may still be present.  Final Clinical Impression(s) / ED Diagnoses Final diagnoses:  None    Rx / DC Orders ED Discharge Orders    None       Gari Crown 02/22/21 1532    Valarie Merino, MD 02/23/21 2316

## 2021-02-22 NOTE — Telephone Encounter (Signed)
CT chest results-Multiple Pulmonary emboli  Cassie reviewed the scans with Dr Julien Nordmann who recommended pt go to ED due to his co-morbidities and CHF.   Contacted  , pts brother ,Dennis [name], and notified him to take Dennis [name] to ED now for multiple blood clots in his lung.  Dennis [name] said yesterday pt was getting out of car and became lightheaded and collapsed. He  was not " out but for a few seconds". Last xarelto or any blood thinner was around April 5th.  Pt denies chest pain any other symptoms .   Dennis [name] reported that his last blood thinner was 3 weeks ago .   Report called to [facility] ED.

## 2021-02-22 NOTE — ED Provider Notes (Signed)
Emergency Medicine Provider Triage Evaluation Note  Genevieve Ritzel. , a 60 y.o. male  was evaluated in triage.  Pt complains of  CT yesterday to check on lung cancer with mets to brain from chemo, call today to report PE on CT and advised to go to the ER. Off Xarelto but stopped when he ran out of medication (previously on Coumadin but switched to Xarelto and when rx ran out he was not advised what to do). Last took blood thinner 4/1. History obtained from brother. On 2L [place] at baseline but increased to 3L 2-3 weeks ago due to increase in work of breathing. Last chemo 02/07/21.   Review of Systems  Positive: SHOB Negative: fever  Physical Exam  There were no vitals taken for this visit. Gen:   Awake, no distress   Resp:  Normal effort  MSK:   Moves extremities without difficulty  Other:    Medical Decision Making  Medically screening exam initiated at 1:59 PM.  Appropriate orders placed.  Jeromie Shimshon Narula. was informed that the remainder of the evaluation will be completed by another provider, this initial triage assessment does not replace that evaluation, and the importance of remaining in the ED until their evaluation is complete.     Tacy Learn, PA-C 02/22/21 1404    Valarie Merino, MD 02/23/21 [phone]

## 2021-02-23 ENCOUNTER — Inpatient Hospital Stay (HOSPITAL_COMMUNITY): Payer: Medicare Other

## 2021-02-23 ENCOUNTER — Encounter (HOSPITAL_COMMUNITY): Payer: Self-pay | Admitting: Internal Medicine

## 2021-02-23 DIAGNOSIS — I5022 Chronic systolic (congestive) heart failure: Secondary | ICD-10-CM | POA: Diagnosis not present

## 2021-02-23 DIAGNOSIS — E1169 Type 2 diabetes mellitus with other specified complication: Secondary | ICD-10-CM

## 2021-02-23 DIAGNOSIS — I339 Acute and subacute endocarditis, unspecified: Secondary | ICD-10-CM | POA: Diagnosis not present

## 2021-02-23 DIAGNOSIS — I255 Ischemic cardiomyopathy: Secondary | ICD-10-CM | POA: Diagnosis not present

## 2021-02-23 DIAGNOSIS — Z95810 Presence of automatic (implantable) cardiac defibrillator: Secondary | ICD-10-CM

## 2021-02-23 DIAGNOSIS — I38 Endocarditis, valve unspecified: Secondary | ICD-10-CM

## 2021-02-23 DIAGNOSIS — F1920 Other psychoactive substance dependence, uncomplicated: Secondary | ICD-10-CM

## 2021-02-23 DIAGNOSIS — I2694 Multiple subsegmental pulmonary emboli without acute cor pulmonale: Secondary | ICD-10-CM | POA: Diagnosis not present

## 2021-02-23 DIAGNOSIS — E11319 Type 2 diabetes mellitus with unspecified diabetic retinopathy without macular edema: Secondary | ICD-10-CM

## 2021-02-23 DIAGNOSIS — I10 Essential (primary) hypertension: Secondary | ICD-10-CM

## 2021-02-23 DIAGNOSIS — I2699 Other pulmonary embolism without acute cor pulmonale: Secondary | ICD-10-CM

## 2021-02-23 DIAGNOSIS — I330 Acute and subacute infective endocarditis: Secondary | ICD-10-CM | POA: Diagnosis not present

## 2021-02-23 DIAGNOSIS — C3411 Malignant neoplasm of upper lobe, right bronchus or lung: Secondary | ICD-10-CM

## 2021-02-23 DIAGNOSIS — E785 Hyperlipidemia, unspecified: Secondary | ICD-10-CM

## 2021-02-23 DIAGNOSIS — Z794 Long term (current) use of insulin: Secondary | ICD-10-CM

## 2021-02-23 LAB — CBC
HCT: 30.9 % — ABNORMAL LOW (ref 39.0–52.0)
Hemoglobin: 9.6 g/dL — ABNORMAL LOW (ref 13.0–17.0)
MCH: 28.9 pg (ref 26.0–34.0)
MCHC: 31.1 g/dL (ref 30.0–36.0)
MCV: 93.1 fL (ref 80.0–100.0)
Platelets: 125 10*3/uL — ABNORMAL LOW (ref 150–400)
RBC: 3.32 MIL/uL — ABNORMAL LOW (ref 4.22–5.81)
RDW: 17.8 % — ABNORMAL HIGH (ref 11.5–15.5)
WBC: 5.3 10*3/uL (ref 4.0–10.5)
nRBC: 0 % (ref 0.0–0.2)

## 2021-02-23 LAB — BASIC METABOLIC PANEL
Anion gap: 10 (ref 5–15)
BUN: 30 mg/dL — ABNORMAL HIGH (ref 6–20)
CO2: 29 mmol/L (ref 22–32)
Calcium: 8.4 mg/dL — ABNORMAL LOW (ref 8.9–10.3)
Chloride: 94 mmol/L — ABNORMAL LOW (ref 98–111)
Creatinine, Ser: 1.41 mg/dL — ABNORMAL HIGH (ref 0.61–1.24)
GFR, Estimated: 58 mL/min — ABNORMAL LOW (ref >60)
Glucose, Bld: 286 mg/dL — ABNORMAL HIGH (ref 70–99)
Potassium: 3.1 mmol/L — ABNORMAL LOW (ref 3.5–5.1)
Sodium: 133 mmol/L — ABNORMAL LOW (ref 135–145)

## 2021-02-23 LAB — GLUCOSE, CAPILLARY
Glucose-Capillary: 291 mg/dL — ABNORMAL HIGH (ref 70–99)
Glucose-Capillary: 156 mg/dL — ABNORMAL HIGH (ref 70–99)
Glucose-Capillary: 176 mg/dL — ABNORMAL HIGH (ref 70–99)
Glucose-Capillary: 218 mg/dL — ABNORMAL HIGH (ref 70–99)

## 2021-02-23 LAB — MAGNESIUM: Magnesium: 1.7 mg/dL (ref 1.7–2.4)

## 2021-02-23 MED ORDER — SODIUM CHLORIDE 0.9% FLUSH: 10.0000 mL | INTRAVENOUS | Status: DC | PRN

## 2021-02-23 MED ORDER — SODIUM CHLORIDE 0.9% FLUSH
10.0000 mL | Freq: Two times a day (BID) | INTRAVENOUS | Status: DC
Start: 2021-02-23 — End: 2021-02-28
  Administered 2021-02-25 – 2021-02-27 (×2): 10 mL

## 2021-02-23 MED ORDER — CHLORHEXIDINE GLUCONATE CLOTH 2 % EX PADS
6.0000 | MEDICATED_PAD | Freq: Every day | CUTANEOUS | Status: DC
  Administered 2021-02-23 – 2021-02-27 (×5): 6 via TOPICAL

## 2021-02-23 MED ORDER — SODIUM CHLORIDE 0.9 % IV SOLN
2.0000 g | INTRAVENOUS | Status: DC
  Administered 2021-02-23 – 2021-02-25 (×3): 2 g via INTRAVENOUS
  Filled 2021-02-23 (×2): qty 2
  Filled 2021-02-23: qty 20
  Filled 2021-02-23: qty 2

## 2021-02-23 MED ORDER — POTASSIUM CHLORIDE 20 MEQ PO PACK
40.0000 meq | PACK | ORAL | Status: AC
  Administered 2021-02-23 (×2): 40 meq via ORAL
  Filled 2021-02-23 (×2): qty 2

## 2021-02-23 NOTE — Consult Note (Signed)
Cardiology Consultation:   Patient ID: Ramere Downs. MRN: [id number]; DOB: 07/11/1961  Admit date: 02/22/2021 Date of Consult: 02/23/2021  PCP:  [name], MD   [facility] Prov[facility] Cardiologist:  Glori Bickers, MD       Patient Profile:   Joab Carden. is a 60 y.o. male with a hx of mixed nonischemic/ischemic cardiomyopathy, chronic systolic congestive heart failure, action, coronary artery disease, metastatic small cell lung cancer, previous ICD, recent left lower extremity DVT, diabetes mellitus, history of CVA, polysubstance abuse who is being seen 02/23/2021 for the evaluation of possible endocarditis at the request of Ankit, Arsenio Loader MD.  History of Present Illness:   Cardiac catheterization at [facility] in 2010 showed occluded LAD with left to left and right to left collaterals but otherwise no obstructive coronary disease.  Patient has an ICD which met ERI November 2021 and there were no plans for generator change and his tacky therapies have been disabled based on his metastatic lung cancer.  Also note he has had difficulties with hypotension and has not tolerated any CHF medications.  Presently on midodrine.  Diagnosed with DVT March 2022.  Patient was treated with Xarelto but this was discontinued recently by report.  Patient seen by oncology April 2022 and there was note of progression of his metastatic lung cancer on follow-up scans.  Oncology recommended that he consider palliative care/hospice versus resuming chemotherapy/immunotherapy and he chose the latter.  Patient was seen in follow-up by oncology and CT scan showed some improvement in lung cancer but there was note of pulmonary embolus and he was directed to the emergency room.  Echocardiogram showed mass in the right atrium/right ventricle (question tricuspid valve endocarditis with involvement of ICD lead) and cardiology asked to evaluate.  Patient states that for the preceding 2 weeks he has noticed  worsening dyspnea on exertion.  No orthopnea, PND, pedal edema, chest pain, palpitations or syncope.  He denies fevers, chills, productive cough, hemoptysis, diarrhea or dysuria.   Past Medical History:  Diagnosis Date  . Alcohol abuse with alcohol-induced mood disorder ([place]) 08/18/2015  . Automatic implantable cardioverter-defibrillator in situ 2012   S/P St Jude Dual Chamber. ICD.  Marland Kitchen CAD (coronary artery disease) 11/16/2014   CAD Coronary angiography (12/2008) with mid LAD totally occluded and collateralized.  . Cardiac LV ejection fraction 10-20%   . Chest pain 09/04/2018  . CHF (congestive heart failure) ([place])   . Chronic systolic heart failure (HCC)    a) Mixed ICM/NICM b) RHC (05/2014): RA 2, RV 19/2/3, PA 22/14 (18), PCWP 6, Fick CO/CI: 5.2 / 2.7, PVR 2.3 WU, PA 60% and 64% c) ECHO (05/2014): EF 20-25%, diff HK, akinesis entireanteroseptal myocardium, triv AI, mod MR, LA mod/sev dilated  . Cocaine abuse ([place]) 01/24/2015  . Cocaine abuse with cocaine-induced mood disorder ([place]) 08/20/2015  . Drug abuse and dependence ([place])   . Dysphagia following cerebral infarction 01/24/2015  . Embolic stroke involving right middle cerebral artery ([place])   . Extensive stage primary small cell carcinoma of lung ([place]) 01/04/2020  . H/O noncompliance with medical treatment, presenting hazards to health 01/24/2015  . Heart murmur   . High cholesterol   . Ischemic cardiomyopathy    a) Coronary angiography (12/2008) at [facility]: [place]: nl, LAD mid 100% stenosis with left to left and right-to-left collaterals to the distal LAD; Lcx: nl, RCA nl.    . 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[place])   . MDD (major depressive disorder), recurrent severe, without psychosis (  [place]) 08/18/2015  . Open wound of foot 02/27/2015  . Pneumonia 1988  . Psychoactive substance-induced mood disorder ([place]) 07/17/2015  . Sleep apnea    "cleared after T&A"  . Status post PICC central line placement   . Stroke ([place])   . Substance induced mood  disorder ([place]) 08/17/2015  . Type II diabetes mellitus ([place])     Past Surgical History:  Procedure Laterality Date  . BRONCHIAL BRUSHINGS  12/28/2019   Procedure: BRONCHIAL BRUSHINGS;  Surgeon: Garner Nash, DO;  Location: [facility];  Service: Thoracic;;  . BRONCHIAL NEEDLE ASPIRATION BIOPSY  12/28/2019   Procedure: BRONCHIAL NEEDLE ASPIRATION BIOPSIES;  Surgeon: Garner Nash, DO;  Location: [place];  Service: Thoracic;;  . BRONCHIAL WASHINGS  12/28/2019   Procedure: BRONCHIAL WASHINGS;  Surgeon: Garner Nash, DO;  Location: [place];  Service: Thoracic;;  . CARDIAC CATHETERIZATION  05/2014  . CARDIAC DEFIBRILLATOR PLACEMENT  03/2011  . CHOLECYSTECTOMY  1994  . FEMUR IM NAIL Left 10/31/2020   Procedure: INTRAMEDULLARY (IM) RETROGRADE FEMORAL NAILING;  Surgeon: Shona Needles, MD;  Location: [facility];  Service: Orthopedics;  Laterality: Left;  . FOREARM FRACTURE SURGERY Left 1980  . FRACTURE SURGERY    . IR IMAGING GUIDED PORT INSERTION  10/11/2020  . RIGHT HEART CATHETERIZATION N/A 05/30/2014   Procedure: RIGHT HEART CATH;  Surgeon: Jolaine Artist, MD;  Location: [facility] CATH LAB;  Service: Cardiovascular;  Laterality: N/A;  . RIGHT HEART CATHETERIZATION N/A 02/07/2015   Procedure: RIGHT HEART CATH;  Surgeon: Jolaine Artist, MD;  Location: [facility] C[facility] LAB;  Service: Cardiovascular;  Laterality: N/A;  . TONSILLECTOMY AND ADENOIDECTOMY  ~ 1998  . VIDEO BRONCHOSCOPY WITH ENDOBRONCHIAL ULTRASOUND N/A 12/28/2019   Procedure: VIDEO BRONCHOSCOPY WITH ENDOBRONCHIAL ULTRASOUND;  Surgeon: Garner Nash, DO;  Location: [facility];  Service: Thoracic;  Laterality: N/A;     Inpatient Medications: Scheduled Meds: . atorvastatin  20 mg Oral Daily  . Chlorhexidine Gluconate Cloth  6 each Topical Daily  . dexamethasone  1 mg Oral Daily  . enoxaparin (LOVENOX) injection  1 mg/kg Subcutaneous BID  . furosemide  20 mg Oral Daily  . insulin aspart  6 Units Subcutaneous TID AC  .  insulin glargine  25 Units Subcutaneous BID  . midodrine  10 mg Oral TID WC  . mometasone-formoterol  2 puff Inhalation BID  . senna  1 tablet Oral BID  . sodium chloride flush  10-40 mL Intracatheter Q12H  . tamsulosin  0.4 mg Oral Daily  . umeclidinium bromide  1 puff Inhalation Daily   Continuous Infusions: . ceFEPime (MAXIPIME) IV 2 g (02/23/21 0238)  . vancomycin 750 mg (02/23/21 0535)   PRN Meds: acetaminophen **OR** acetaminophen, bisacodyl, guaiFENesin, hydrALAZINE, HYDROcodone-acetaminophen, ipratropium-albuterol, methocarbamol, ondansetron **OR** ondansetron (ZOFRAN) IV, prochlorperazine, senna-docusate, sodium chloride, sodium chloride flush  Allergies:   No Known Allergies  Social History:   Social History   Socioeconomic History  . Marital status: Single    Spouse name: Not on file  . Number of children: 1  . Years of education: 49  . Highest education level: Not on file  Occupational History  . Occupation: Disabled  Tobacco Use  . Smoking status: Former Smoker    Packs/day: 0.25    Years: 31.00    Pack years: 7.75    Types: Cigarettes  . Smokeless tobacco: Never Used  Vaping Use  . Vaping Use: Never used  Substance and Sexual Activity  . Alcohol use: Not  Currently    Alcohol/week: 0.0 standard drinks    Comment: 09/21/2014 "might have a drink q 6 /months, if that"  . Drug use: Yes    Types: Cocaine    Comment: last use 07/06/2020  . Sexual activity: Yes  Other Topics Concern  . Not on file  Social History Narrative   Lives in [place] with his mother. No pets. Fun: movies, sports, daughter.    Denies religious beliefs that would effect health care.    Social Determinants of Health   Financial Resource Strain: Not on file  Food Insecurity: Not on file  Transportation Needs: Not on file  Physical Activity: Not on file  Stress: Not on file  Social Connections: Not on file  Intimate Partner Violence: Not on file    Family History:    Family  History  Problem Relation Age of Onset  . Diabetes Mother   . Heart disease Mother   . Diabetes Father   . Prostate cancer Father   . Heart disease Father   . Diabetes Brother   . Diabetes Brother      ROS:  Please see the history of present illness.  Some decreased appetite with recent initiation of chemotherapy. All other ROS reviewed and negative.     Physical Exam/Data:   Vitals:   02/22/21 1730 02/22/21 2044 02/23/21 0207 02/23/21 0533  BP: 109/79 102/80 102/79 100/76  Pulse: 93 (!) 102 99 97  Resp:   18 17  Temp:  97.6 F (36.4 C) 98.4 F (36.9 C) 98.2 F (36.8 C)  TempSrc:  Oral Oral   SpO2: 100% 100% 100% 100%  Weight:    72.8 kg  Height:        Intake/Output Summary (Last 24 hours) at 02/23/2021 0830 Last data filed at 02/23/2021 0600 Gross per 24 hour  Intake 450 ml  Output --  Net 450 ml   Last 3 Weights 02/23/2021 02/22/2021 02/05/2021  Weight (lbs) 160 lb 7.9 oz 175 lb 6.4 oz 175 lb 6.4 oz  Weight (kg) 72.8 kg 79.561 kg 79.561 kg  Some encounter information is confidential and restricted. Go to Review Flowsheets activity to see all data.     Body mass index is 21.77 kg/m.  General:  Well nourished, chronically ill appearing, in no acute distress HEENT: normal Lymph: no adenopathy Neck: no JVD Endocrine:  No thryomegaly Vascular: No carotid bruits; FA pulses 2+ bilaterally without bruits  Cardiac:  normal S1, S2; RRR; no murmur Lungs:  clear to auscultation bilaterally, no wheezing, rhonchi or rales, catheter in place right chest Abd: soft, nontender, no hepatomegaly  Ext: no edema Musculoskeletal:  No deformities, BUE and BLE strength normal and equal Skin: warm and dry  Neuro:  CNs 2-12 intact, no focal abnormalities noted Psych:  Normal affect   Telemetry:  Telemetry was personally reviewed and demonstrates: Normal sinus rhythm to sinus tachycardia with PVCs.  Laboratory Data:  High Sensitivity Troponin:   Recent Labs  Lab 02/22/21 1442  02/22/21 1632  TROPONINIHS 23* 23*     Chemistry Recent Labs  Lab 02/21/21 1452 02/22/21 1442  NA 137 134*  K 3.8 3.6  CL 93* 93*  CO2 32 34*  GLUCOSE 239* 337*  BUN 29* 32*  CREATININE 1.52* 1.39*  CALCIUM 9.5 9.0  GFRNONAA 53* 59*  ANIONGAP 12 7    Recent Labs  Lab 02/21/21 1452  PROT 6.7  ALBUMIN 3.2*  AST 21  ALT 19  ALKPHOS 190*  BILITOT 0.5   Hematology Recent Labs  Lab 02/21/21 1452 02/22/21 1442  WBC 10.4 7.5  RBC 3.97* 3.74*  HGB 11.5* 10.9*  HCT 36.2* 34.5*  MCV 91.2 92.2  MCH 29.0 29.1  MCHC 31.8 31.6  RDW 17.6* 17.9*  PLT 84* 128*   BNP Recent Labs  Lab 02/22/21 1442  BNP 408.7*     Radiology/Studies:  CT Chest W Contrast  Result Date: 02/22/2021 CLINICAL DATA:  Primary Cancer Type: Lung Imaging Indication: Assess response to therapy Interval therapy since last imaging? Yes Initial Cancer Diagnosis Date: 12/28/2019; Established by: Presumed Detailed Pathology: Extensive stage small cell lung cancer. Primary Tumor location: Right middle lobe.  Brain metastasis. Surgeries: ICD.  Cholecystectomy. Chemotherapy: Yes; Ongoing? Yes; Most recent administration: 02/05/2021 Immunotherapy?  Yes; Type: Imfinzi; Ongoing? No Radiation therapy? Yes; Date Range: 01/04/2020 - 01/17/2020; Target: Brain EXAM: CT CHEST, ABDOMEN, AND PELVIS WITH CONTRAST TECHNIQUE: Multidetector CT imaging of the chest, abdomen and pelvis was performed following the standard protocol during bolus administration of intravenous contrast. CONTRAST:  64m OMNIPAQUE IOHEXOL 300 MG/ML  SOLN COMPARISON:  Most recent CT chest, abdomen and pelvis 12/24/2020. 01/17/2020 PET-CT. FINDINGS: CT CHEST FINDINGS Cardiovascular: Left chest multi lead pacer defibrillator. Right chest port catheter. Aortic atherosclerosis. Global cardiomegaly. Three-vessel coronary artery calcifications and/or stents. No pericardial effusion. Incidental note of new bilateral pulmonary embolism, noted in the distal right  pulmonary artery and more distal vessels as well as in the lobar to segmental vessels of the left lower lobe. Mediastinum/Nodes: Interval decrease in size of previously enlarged pretracheal, subcarinal, and right hilar lymph nodes, largest pretracheal node measuring 2.0 x 1.0 cm, previously 2.3 x 1.8 cm when measured similarly (series 3, image 25). Thyroid gland, trachea, and esophagus demonstrate no significant findings. Lungs/Pleura: Moderate centrilobular emphysema. Diffuse bilateral bronchial wall thickening. Interval resolution of previously seen small bilateral pleural effusions. Interval decrease in size of a spiculated nodule of the posterior lateral segment right middle lobe abutting the major fissure, measuring 1.3 x 0.7 cm previously 1.6 x 1.0 cm when measured similarly (series 8, image 102). Musculoskeletal: No chest wall mass or suspicious bone lesions identified. CT ABDOMEN PELVIS FINDINGS Hepatobiliary: No focal liver abnormality is seen. Status post cholecystectomy. No biliary dilatation. Pancreas: Unremarkable. No pancreatic ductal dilatation or surrounding inflammatory changes. Spleen: Normal in size without significant abnormality. Adrenals/Urinary Tract: Stable, definitively benign right adrenal adenoma (series 3, image 60). Punctuate nonobstructive calculus of the inferior pole of the right kidney. No ureteral calculus or hydronephrosis. Thickening of the urinary bladder wall. Stomach/Bowel: Stomach is within normal limits. Appendix appears normal. No evidence of bowel wall thickening, distention, or inflammatory changes. Vascular/Lymphatic: Aortic atherosclerosis. No enlarged abdominal or pelvic lymph nodes. Reproductive: No mass or other abnormality. Other: No abdominal wall hernia or abnormality. No abdominopelvic ascites. Musculoskeletal: No acute or significant osseous findings. IMPRESSION: 1. Interval decrease in size of a spiculated nodule of the posterior lateral segment right middle  lobe. 2. Interval decrease in size of previously enlarged pretracheal, subcarinal, and right hilar lymph nodes. 3. Interval resolution of previously seen small bilateral pleural effusions. 4. Findings are consistent with treatment response. 5. Incidental note of new bilateral pulmonary embolism, noted in the distal right pulmonary artery and more distal vessels as well as in the lobar to segmental vessels of the left lower lobe. 6. No evidence of metastatic disease in the abdomen or pelvis. 7. Nonobstructive right nephrolithiasis. 8. New thickening of the urinary bladder wall, which may be due  to nonspecific infectious or inflammatory cystitis. Correlate with urinalysis. 9. Emphysema. 10. Cardiomegaly and coronary artery disease. Call report request to [place] was placed at the time of interpretation. Final communication will be documented. Aortic Atherosclerosis (ICD10-I70.0) and Emphysema (ICD10-J43.9). Electronically Signed   By: Eddie Candle M.D.   On: 02/22/2021 11:11   CT Abdomen Pelvis W Contrast  Result Date: 02/22/2021 CLINICAL DATA:  Primary Cancer Type: Lung Imaging Indication: Assess response to therapy Interval therapy since last imaging? Yes Initial Cancer Diagnosis Date: 12/28/2019; Established by: Presumed Detailed Pathology: Extensive stage small cell lung cancer. Primary Tumor location: Right middle lobe.  Brain metastasis. Surgeries: ICD.  Cholecystectomy. Chemotherapy: Yes; Ongoing? Yes; Most recent administration: 02/05/2021 Immunotherapy?  Yes; Type: Imfinzi; Ongoing? No Radiation therapy? Yes; Date Range: 01/04/2020 - 01/17/2020; Target: Brain EXAM: CT CHEST, ABDOMEN, AND PELVIS WITH CONTRAST TECHNIQUE: Multidetector CT imaging of the chest, abdomen and pelvis was performed following the standard protocol during bolus administration of intravenous contrast. CONTRAST:  74m OMNIPAQUE IOHEXOL 300 MG/ML  SOLN COMPARISON:  Most recent CT chest, abdomen and pelvis 12/24/2020.  01/17/2020 PET-CT. FINDINGS: CT CHEST FINDINGS Cardiovascular: Left chest multi lead pacer defibrillator. Right chest port catheter. Aortic atherosclerosis. Global cardiomegaly. Three-vessel coronary artery calcifications and/or stents. No pericardial effusion. Incidental note of new bilateral pulmonary embolism, noted in the distal right pulmonary artery and more distal vessels as well as in the lobar to segmental vessels of the left lower lobe. Mediastinum/Nodes: Interval decrease in size of previously enlarged pretracheal, subcarinal, and right hilar lymph nodes, largest pretracheal node measuring 2.0 x 1.0 cm, previously 2.3 x 1.8 cm when measured similarly (series 3, image 25). Thyroid gland, trachea, and esophagus demonstrate no significant findings. Lungs/Pleura: Moderate centrilobular emphysema. Diffuse bilateral bronchial wall thickening. Interval resolution of previously seen small bilateral pleural effusions. Interval decrease in size of a spiculated nodule of the posterior lateral segment right middle lobe abutting the major fissure, measuring 1.3 x 0.7 cm previously 1.6 x 1.0 cm when measured similarly (series 8, image 102). Musculoskeletal: No chest wall mass or suspicious bone lesions identified. CT ABDOMEN PELVIS FINDINGS Hepatobiliary: No focal liver abnormality is seen. Status post cholecystectomy. No biliary dilatation. Pancreas: Unremarkable. No pancreatic ductal dilatation or surrounding inflammatory changes. Spleen: Normal in size without significant abnormality. Adrenals/Urinary Tract: Stable, definitively benign right adrenal adenoma (series 3, image 60). Punctuate nonobstructive calculus of the inferior pole of the right kidney. No ureteral calculus or hydronephrosis. Thickening of the urinary bladder wall. Stomach/Bowel: Stomach is within normal limits. Appendix appears normal. No evidence of bowel wall thickening, distention, or inflammatory changes. Vascular/Lymphatic: Aortic  atherosclerosis. No enlarged abdominal or pelvic lymph nodes. Reproductive: No mass or other abnormality. Other: No abdominal wall hernia or abnormality. No abdominopelvic ascites. Musculoskeletal: No acute or significant osseous findings. IMPRESSION: 1. Interval decrease in size of a spiculated nodule of the posterior lateral segment right middle lobe. 2. Interval decrease in size of previously enlarged pretracheal, subcarinal, and right hilar lymph nodes. 3. Interval resolution of previously seen small bilateral pleural effusions. 4. Findings are consistent with treatment response. 5. Incidental note of new bilateral pulmonary embolism, noted in the distal right pulmonary artery and more distal vessels as well as in the lobar to segmental vessels of the left lower lobe. 6. No evidence of metastatic disease in the abdomen or pelvis. 7. Nonobstructive right nephrolithiasis. 8. New thickening of the urinary bladder wall, which may be due to nonspecific infectious or inflammatory cystitis. Correlate with  urinalysis. 9. Emphysema. 10. Cardiomegaly and coronary artery disease. Call report request to [place] was placed at the time of interpretation. Final communication will be documented. Aortic Atherosclerosis (ICD10-I70.0) and Emphysema (ICD10-J43.9). Electronically Signed   By: Eddie Candle M.D.   On: 02/22/2021 11:11   ECHOCARDIOGRAM COMPLETE  Result Date: 02/22/2021    ECHOCARDIOGRAM REPORT   Patient Name:   Reda Gettis. Date of Exam: 02/22/2021 Medical Rec #:  [id number]       Height:       72.0 in Accession #:    [id number]      Weight:       175.4 lb Date of Birth:  30-May-1961        BSA:          2.015 m Patient Age:    70 years        BP:           108/80 mmHg Patient Gender: M               HR:           93 bpm. Exam Location:  Inpatient Procedure: 2D Echo, Color Doppler and Cardiac Doppler Indications:    Pulmonary embolus  History:        Patient has prior history of Echocardiogram  examinations, most                 recent 12/26/2020. CHF, CAD; Risk Factors:Diabetes.  Sonographer:    Cammy Brochure Referring Phys: [phone] [place]  1. Left ventricular ejection fraction, by estimation, is <20%. The left ventricle has severely decreased function. The left ventricle demonstrates global hypokinesis. The left ventricular internal cavity size was mildly dilated. There is mild left ventricular hypertrophy. Left ventricular diastolic parameters are indeterminate.  2. Right ventricular systolic function is moderately reduced. The right ventricular size is mildly enlarged. Tricuspid regurgitation signal is inadequate for assessing PA pressure.  3. Left atrial size was mildly dilated.  4. The mitral valve is normal in structure. Trivial mitral valve regurgitation. No evidence of mitral stenosis.  5. The tricuspid valve is abnormal. There is a 2.4 x 1.4 cm vegetation that appears to be attached to the tricuspid valve as well as vegetation attached to the ICD lead as it transits the tricuspid valve. The vegetation is not completely visualized on this study, but I think this looks more like endocarditis than a sterile thrombus in transit. Would followup with TEE.  6. The aortic valve is tricuspid. Aortic valve regurgitation is trivial.  7. Aortic dilatation noted. There is mild dilatation of the aortic root, measuring 37 mm.  8. The inferior vena cava is normal in size with greater than 50% respiratory variability, suggesting right atrial pressure of 3 mmHg. FINDINGS  Left Ventricle: Left ventricular ejection fraction, by estimation, is <20%. The left ventricle has severely decreased function. The left ventricle demonstrates global hypokinesis. The left ventricular internal cavity size was mildly dilated. There is mild left ventricular hypertrophy. Left ventricular diastolic parameters are indeterminate. Right Ventricle: The right ventricular size is mildly enlarged. No increase in right  ventricular wall thickness. Right ventricular systolic function is moderately reduced. Tricuspid regurgitation signal is inadequate for assessing PA pressure. Left Atrium: Left atrial size was mildly dilated. Right Atrium: Right atrial size was normal in size. Pericardium: There is no evidence of pericardial effusion. Mitral Valve: The mitral valve is normal in structure. Trivial mitral valve regurgitation. No evidence of  mitral valve stenosis. Tricuspid Valve: The tricuspid valve is abnormal. Tricuspid valve regurgitation is trivial. Aortic Valve: The aortic valve is tricuspid. Aortic valve regurgitation is trivial. Aortic valve mean gradient measures 2.0 mmHg. Aortic valve peak gradient measures 3.3 mmHg. Aortic valve area, by VTI measures 2.18 cm. Pulmonic Valve: The pulmonic valve was normal in structure. Pulmonic valve regurgitation is not visualized. Aorta: Aortic dilatation noted. There is mild dilatation of the aortic root, measuring 37 mm. Venous: The inferior vena cava is normal in size with greater than 50% respiratory variability, suggesting right atrial pressure of 3 mmHg. IAS/Shunts: No atrial level shunt detected by color flow Doppler. Additional Comments: A device lead is visualized in the right ventricle.  LEFT VENTRICLE PLAX 2D LVIDd:         6.09 cm LVIDs:         5.42 cm LV PW:         1.31 cm LV IVS:        1.31 cm LVOT diam:     2.40 cm LV SV:         27 LV SV Index:   13 LVOT Area:     4.52 cm  RIGHT VENTRICLE RV Basal diam:  4.60 cm LEFT ATRIUM             Index       RIGHT ATRIUM           Index LA diam:        2.50 cm 1.24 cm/m  RA Area:     15.60 cm LA Vol (A2C):   49.2 ml 24.39 ml/m RA Volume:   39.00 ml  19.36 ml/m LA Vol (A4C):   54.5 ml 27.05 ml/m LA Biplane Vol: 58.8 ml 29.18 ml/m  AORTIC VALVE AV Area (Vmax):    2.22 cm AV Area (Vmean):   2.45 cm AV Area (VTI):     2.18 cm AV Vmax:           90.50 cm/s AV Vmean:          60.600 cm/s AV VTI:            0.124 m AV Peak Grad:       3.3 mmHg AV Mean Grad:      2.0 mmHg LVOT Vmax:         44.40 cm/s LVOT Vmean:        32.800 cm/s LVOT VTI:          0.060 m LVOT/AV VTI ratio: 0.48  AORTA Ao Root diam: 3.70 cm Ao Asc diam:  3.60 cm MITRAL VALVE MV Area (PHT): 6.17 cm     SHUNTS MV Decel Time: 123 msec     Systemic VTI:  0.06 m MV E velocity: 101.00 cm/s  Systemic Diam: 2.40 cm Loralie Champagne MD Electronically signed by Loralie Champagne MD Signature Date/Time: 02/22/2021/4:32:59 PM    Final      Assessment and Plan:   1. Right heart/tricuspid valve mass/pulmonary embolus-I have personally reviewed the patient's echocardiogram.  There is a mass in the right heart possibly associated with ICD/tricuspid valve.  I think this is likely thrombus based on history.  Patient had a DVT in March.  He was started on Xarelto but this was not continued and he has not been anticoagulated over the past month.  History is not suggestive of endocarditis as he has not been febrile and his white blood cell count is normal.  He has multiple comorbidities  including severe LV dysfunction, chronic systolic congestive heart failure and metastatic lung cancer.  I do not think he would be a good candidate for aggressive evaluation including TEE or ICD removal.  For now I would recommend anticoagulation with Lovenox.  Would need hematology/oncology input concerning best anticoagulant for DVT/pulmonary embolus in patient with metastatic lung cancer.  However Lovenox likely best option.  Would continue antibiotics for possible endocarditis and follow-up cultures.  If cultures negative discontinue antibiotics and continue therapy for DVT/pulmonary embolus. 2. Metastatic lung cancer-recently initiated on new therapy with some improvement.  However his prognosis is poor.  I initiated discussions concerning CODE STATUS and he and his brother will discuss. 3. ICD-was at [facility] November 2021 and patient felt not to be a candidate for generator replacement given metastatic lung  cancer. 4. Chronic systolic congestive heart failure-he appears to be euvolemic on examination.  Continue Lasix at present dose. 5. Cardiomyopathy-mixed ischemic/nonischemic-blood pressure will not allow ARB, Entresto or beta-blockade. 6. Coronary artery disease-continue statin. 7. Urinary tract infection-continue antibiotics per primary service. 8. Chronic stage IIIa kidney disease-follow renal function while in house.  Despite recent response to chemotherapy long-term prognosis is poor.   For questions or updates, please contact [place] Please consult [email or web address] for contact info under    Signed, Kirk Ruths, MD  02/23/2021 8:30 AM

## 2021-02-23 NOTE — Consult Note (Signed)
Date of Admission:  02/22/2021          Reason for Consult: Endocarditis and vegetation on ICD   Referring Provider: Dr Reesa Chew   Assessment:  1. 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 Extensive deep venous thrombosis with pulmonary emboli 3. Metastatic muscle lung cancer on chemotherapy with port present 4. Severe LV dysfunction and chronic systolic congestive heart failure with ICD 5. UTI 6. Diabetes mellitus 7. History of cocaine use  Plan:  1. Agree with continuing coverage for possible endocarditis for now with vancomycin and would change to ceftriaxone 2. Anticoagulation 3. Follow-up on blood cultures 4. 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 Check HIV and HCV if not checked already   Principal Problem:   Endocarditis Active Problems:   Type II diabetes mellitus with ophthalmic manifestations ([place])   Drug abuse and dependence ([place])   Cardiomyopathy, ischemic   Automatic implantable cardioverter-defibrillator in situ   Chronic systolic CHF (congestive heart failure) ([place])   Hyperlipidemia associated with type 2 diabetes mellitus ([place])   Essential hypertension   Small cell lung cancer, right upper lobe ([place])   Pulmonary embolism (HCC)   Scheduled Meds: . atorvastatin  20 mg Oral Daily  . Chlorhexidine Gluconate Cloth  6 each Topical Daily  . dexamethasone  1 mg Oral Daily  . enoxaparin (LOVENOX) injection  1 mg/kg Subcutaneous BID  . furosemide  20 mg Oral Daily  . insulin aspart  6 Units Subcutaneous TID AC  . insulin glargine  25 Units Subcutaneous BID  . midodrine  10 mg Oral TID WC  . mometasone-formoterol  2 puff Inhalation BID  . potassium chloride  40 mEq Oral  Q4H  . senna  1 tablet Oral BID  . sodium chloride flush  10-40 mL Intracatheter Q12H  . tamsulosin  0.4 mg Oral Daily  . umeclidinium bromide  1 puff Inhalation Daily   Continuous Infusions: . ceFEPime (MAXIPIME) IV 2 g (02/23/21 0901)  . vancomycin 750 mg (02/23/21 0535)   PRN Meds:.acetaminophen **OR** acetaminophen, bisacodyl, guaiFENesin, hydrALAZINE, HYDROcodone-acetaminophen, ipratropium-albuterol, methocarbamol, ondansetron **OR** ondansetron (ZOFRAN) IV, prochlorperazine, senna-docusate, sodium chloride, sodium chloride flush  HPI: Dennis [name]. is a 60 y.o. male with known small cell lung cancer on chemotherapy via port severe systolic heart failure with an EF of 15% with ICD history of cocaine use hypertension stroke, Previously on anticoagulation with warfarin then changed to Xarelto but only given a 20-day supply and having been off anticoagulation for some several months I believe.  He had a CT scan done by oncology to evaluate his tumors response to chemotherapy.  CT showed decrease in his spiculated nodule the posterior lateral segment the middle lobe and decrease in previous enlarged pretracheal subcarinal and right hilar lymph nodes with resolution of small pleural effusion but also new bilateral pulmonary emboli.  Again also indicated some of urinary bladder wall with a nonobstructing calculus.  He did endorse dysuria on admission and has some pyuria.  Cultures and urine cultures have been sent.  Vascular studies show an acute DVT involving the left common femoral SV junction left femoral vein left posterior tibial veins left peroneal veins and left gastrocnemius veins and chronic deep venous thrombosis involving the left popliteal vein  along with chronic superficial venous vein thrombosis involving the left small saphenous vein.  She has not had any problems with fevers or night sweats.  He has not had any wounds or dental pain.  I think his story is much more  consistent with a hypercoagulable state due to his known malignancy that is causing both his DVT his pulmonary emboli and likely also clot in the heart and on his device and possibly tricuspid valve.  This is also reinforced by Dr. Jacalyn Lefevre read of the transthoracic echocardiogram.  Now we will give him vancomycin and ceftriaxone until we get the blood cultures back but if they remain sterile at 5 days would discontinue IV antibiotics and could dc them sooner depending on his clinical course.  I spent greater than 80 minutes with the patient including greater than 50% of time in face to face counsel of the patient, and reviewing his radiographs, images personally laboratory data and in coordination of his care.       Review of Systems: Review of Systems  Constitutional: Negative for chills, diaphoresis, fever, malaise/fatigue and weight loss.  HENT: Negative for congestion, hearing loss, sore throat and tinnitus.   Eyes: Negative for blurred vision and double vision.  Respiratory: Negative for cough, sputum production, shortness of breath and wheezing.   Cardiovascular: Negative for chest pain, palpitations and leg swelling.  Gastrointestinal: Negative for abdominal pain, blood in stool, constipation, diarrhea, heartburn, melena, nausea and vomiting.  Genitourinary: Negative for dysuria, flank pain and hematuria.  Musculoskeletal: Negative for back pain, falls, joint pain and myalgias.  Skin: Negative for itching and rash.  Neurological: Negative for dizziness, sensory change, focal weakness, loss of consciousness, weakness and headaches.  Endo/Heme/Allergies: Does not bruise/bleed easily.  Psychiatric/Behavioral: Negative for depression, memory loss and suicidal ideas. The patient is not nervous/anxious.     Past Medical History:  Diagnosis Date  . Alcohol abuse with alcohol-induced mood disorder ([place]) 08/18/2015  . Automatic implantable cardioverter-defibrillator in situ 2012   S/P  St Jude Dual Chamber. ICD.  Marland Kitchen CAD (coronary artery disease) 11/16/2014   CAD Coronary angiography (12/2008) with mid LAD totally occluded and collateralized.  . Cardiac LV ejection fraction 10-20%   . Chest pain 09/04/2018  . CHF (congestive heart failure) ([place])   . Chronic systolic heart failure (HCC)    a) Mixed ICM/NICM b) RHC (05/2014): RA 2, RV 19/2/3, PA 22/14 (18), PCWP 6, Fick CO/CI: 5.2 / 2.7, PVR 2.3 WU, PA 60% and 64% c) ECHO (05/2014): EF 20-25%, diff HK, akinesis entireanteroseptal myocardium, triv AI, mod MR, LA mod/sev dilated  . Cocaine abuse ([place]) 01/24/2015  . Cocaine abuse with cocaine-induced mood disorder ([place]) 08/20/2015  . Drug abuse and dependence ([place])   . Dysphagia following cerebral infarction 01/24/2015  . Embolic stroke involving right middle cerebral artery ([place])   . Extensive stage primary small cell carcinoma of lung ([place]) 01/04/2020  . H/O noncompliance with medical treatment, presenting hazards to health 01/24/2015  . Heart murmur   . High cholesterol   . Ischemic cardiomyopathy    a) Coronary angiography (12/2008) at [facility]: [place]: nl, LAD mid 100% stenosis with left to left and right-to-left collaterals to the distal LAD; Lcx: nl, RCA nl.    . 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[place])   . MDD (major depressive disorder), recurrent severe, without psychosis ([place]) 08/18/2015  . Open wound of foot 02/27/2015  . Pneumonia 1988  . Psychoactive substance-induced mood disorder ([place]) 07/17/2015  . Sleep apnea    "  cleared after T&A"  . Status post PICC central line placement   . Stroke ([place])   . Substance induced mood disorder ([place]) 08/17/2015  . Type II diabetes mellitus (HCC)     Social History   Tobacco Use  . Smoking status: Former Smoker    Packs/day: 0.25    Years: 31.00    Pack years: 7.75    Types: Cigarettes  . Smokeless tobacco: Never Used  Vaping Use  . Vaping Use: Never used  Substance Use Topics  . Alcohol use: Not Currently    Alcohol/week: 0.0  standard drinks    Comment: 09/21/2014 "might have a drink q 6 /months, if that"  . Drug use: Yes    Types: Cocaine    Comment: last use 07/06/2020    Family History  Problem Relation Age of Onset  . Diabetes Mother   . Heart disease Mother   . Diabetes Father   . Prostate cancer Father   . Heart disease Father   . Diabetes Brother   . Diabetes Brother    No Known Allergies  OBJECTIVE: Blood pressure 100/76, pulse 97, temperature 98.2 F (36.8 C), resp. rate 17, height 6' (1.829 m), weight 72.8 kg, SpO2 98 %.  Physical Exam  Lab Results Lab Results  Component Value Date   WBC 5.3 02/23/2021   HGB 9.6 (L) 02/23/2021   HCT 30.9 (L) 02/23/2021   MCV 93.1 02/23/2021   PLT 125 (L) 02/23/2021    Lab Results  Component Value Date   CREATININE 1.41 (H) 02/23/2021   BUN 30 (H) 02/23/2021   NA 133 (L) 02/23/2021   K 3.1 (L) 02/23/2021   CL 94 (L) 02/23/2021   CO2 29 02/23/2021    Lab Results  Component Value Date   ALT 19 02/21/2021   AST 21 02/21/2021   ALKPHOS 190 (H) 02/21/2021   BILITOT 0.5 02/21/2021     Microbiology: Recent Results (from the past 240 hour(s))  Resp Panel by RT-PCR (Flu A&B, Covid) Nasopharyngeal Swab     Status: None   Collection Time: 02/22/21  2:42 PM   Specimen: Nasopharyngeal Swab; Nasopharyngeal(NP) swabs in vial transport medium  Result Value Ref Range Status   SARS Coronavirus 2 by RT PCR NEGATIVE NEGATIVE Final    Comment: (NOTE) SARS-CoV-2 target nucleic acids are NOT DETECTED.  The SARS-CoV-2 RNA is generally detectable in upper respiratory specimens during the acute phase of infection. The lowest concentration of SARS-CoV-2 viral copies this assay can detect is 138 copies/mL. A negative result does not preclude SARS-Cov-2 infection and should not be used as the sole basis for treatment or other patient management decisions. A negative result may occur with  improper specimen collection/handling, submission of specimen  other than nasopharyngeal swab, presence of viral mutation(s) within the areas targeted by this assay, and inadequate number of viral copies(<138 copies/mL). A negative result must be combined with clinical observations, patient history, and epidemiological information. The expected result is Negative.  Fact Sheet for Patients:  EntrepreneurPulse.com.au  Fact Sheet for Healthcare Providers:  IncredibleEmployment.be  This test is no t yet approved or cleared by the [place] FDA and  has been authorized for detection and/or diagnosis of SARS-CoV-2 by FDA under an Emergency Use Authorization (EUA). This EUA will remain  in effect (meaning this test can be used) for the duration of the COVID-19 declaration under Section 564(b)(1) of the Act, 21 U.S.C.section 360bbb-3(b)(1), unless the authorization is terminated  or revoked sooner.  Influenza A by PCR NEGATIVE NEGATIVE Final   Influenza B by PCR NEGATIVE NEGATIVE Final    Comment: (NOTE) The Xpert Xpress SARS-CoV-2/FLU/RSV plus assay is intended as an aid in the diagnosis of influenza from Nasopharyngeal swab specimens and should not be used as a sole basis for treatment. Nasal washings and aspirates are unacceptable for Xpert Xpress SARS-CoV-2/FLU/RSV testing.  Fact Sheet for Patients: EntrepreneurPulse.com.au  Fact Sheet for Healthcare Providers: IncredibleEmployment.be  This test is not yet approved or cleared by the [place] FDA and has been authorized for detection and/or diagnosis of SARS-CoV-2 by FDA under an Emergency Use Authorization (EUA). This EUA will remain in effect (meaning this test can be used) for the duration of the COVID-19 declaration under Section 564(b)(1) of the Act, 21 U.S.C. section 360bbb-3(b)(1), unless the authorization is terminated or revoked.  Performed at [facility], [place] [address], [place], [place] [address]     Alcide Evener, [place] [facility] [phone] pager  02/23/2021, 12:07 PM

## 2021-02-23 NOTE — Progress Notes (Signed)
BLE venous duplex has been completed.  Critical results given to Joelene Millin, RN at [phone]  Results can be found under chart review under CV PROC. 02/23/2021 10:01 AM Loyce Flaming RVT, RDMS

## 2021-02-23 NOTE — Progress Notes (Signed)
PROGRESS NOTE    Dennis [name].  YCX:[id number] DOB: [birth year] DOA: 02/22/2021 PCP: [name], MD   Brief Narrative:  60 y.o. male with medical history significant of metastatic small cell lung cancer on chemotherapy, systolic CHF EF 49% status post AICD, history of cocaine use, HTN, recent CVA, HLD, DM2, lower extremity DVT used to be on Xarelto, chronic hypoxia on 2 L nasal cannula comes to the hospital as he was directed by his oncologist for evaluation of pulmonary embolism.  Echo showed tricuspid endocarditis extending into his ICD leads.   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  On empiric vancomycin and cefepime - Suspect vegetation could be clot.  We will continue antibiotics until cultures have remained negative. -  Does have a Chemo-Port in place which could be source -Blood cultures-sent -Cardiology and ID following  Bilateral pulmonary embolism without cor pulmonale History of DVT - Suspect if this is septic emboli.  Continue antibiotics, Lovenox 1 mg/kg every 12 hours - Lower extremity Dopplers- extensive left lower extremity DVT -Apparently there was a confusion that after a month supply family do not refill medications therefore has been off Xarelto for past about 6 weeks.  Metastatic small cell lung cancer - Follows outpatient [name].  On outpatient chemotherapy.  Daily dexamethasone.  Urinary tract infection without  hematuria - Urine cultures-pending - Already on antibiotics  Chronic congestive heart failure reduced ejection fraction, EF 15% status post ICD - Not on any home medications because of hypotension.  Continue midodrine -Supportive care.  Lasix 20 mg daily  CKD stage IIIa - Chronic.  Creatinine at baseline of 1.3. am labs pending.   Diabetes mellitus type 2 - Continue Lantus 25 units twice daily.  Insulin sliding scale and Accu-Chek - Lispro 6 units 3 times Premeal's  Chronic hypoxia on 2 L nasal cannula History of COPD/emphysema - Bronchodilators  Hyperlipidemia - Lipitor  Goals of care discussion- I had extensive discussion with the patient and his brother at bedside.  They are agreeable to transition patient to DNR/DNI and will discuss possibility of transitioning to hospice.    DVT prophylaxis: Lovenox 1 mg/kg Code Status: Full code Family Communication: Brother at bedside  Status is: Inpatient  Remains inpatient appropriate because:Hemodynamically unstable   Dispo: The patient is from: Home              Anticipated d/c is to: Home              Patient currently is not medically stable to d/c.  Ongoing evaluation for endocarditis and treatment for pulmonary embolism.  Unsafe for discharge.   Difficult to place patient No     Subjective: Patient does not have any complaints.  I had extensive discussion with the patient and his brother regarding goals of care especially with advanced malignancy which is likely not curable, active blood clots and CHF.  They understand that overall his prognosis is poor and agreeable to transition to DNR/DNI.  We also  discussed hospice care, patient and his brother will talk about this over next 24-48 hours to determine best option for himself.  Review of Systems Otherwise negative except as per HPI, including: General: Denies fever, chills, night sweats or unintended weight loss. Resp: Denies cough, wheezing, shortness of  breath. Cardiac: Denies chest pain, palpitations, orthopnea, paroxysmal nocturnal dyspnea. GI: Denies abdominal pain, nausea, vomiting, diarrhea or constipation GU: Denies dysuria, frequency, hesitancy or incontinence MS: Denies muscle aches, joint pain or swelling Neuro: Denies headache, neurologic deficits (focal weakness, numbness, tingling), abnormal gait Psych: Denies anxiety, depression, SI/HI/AVH Skin: Denies new rashes or lesions ID: Denies sick contacts, exotic exposures, travel  Examination:  General exam: Appears calm and comfortable, cachectic frail with bilateral temporal wasting Respiratory system: Clear to auscultation. Respiratory effort normal. Cardiovascular system: S1 & S2 heard, RRR. No JVD, murmurs, rubs, gallops or clicks. No pedal edema. Gastrointestinal system: Abdomen is nondistended, soft and nontender. No organomegaly or masses felt. Normal bowel sounds heard. Central nervous system: Alert and oriented. No focal neurological deficits. Extremities: Symmetric 5 x 5 power. Skin: No rashes, lesions or ulcers Psychiatry: Judgement and insight appear normal. Mood & affect appropriate.   Right chest wall Chemo-Port in place  Objective: Vitals:   02/22/21 1730 02/22/21 2044 02/23/21 0207 02/23/21 0533  BP: 109/79 102/80 102/79 100/76  Pulse: 93 (!) 102 99 97  Resp:   18 17  Temp:  97.6 F (36.4 C) 98.4 F (36.9 C) 98.2 F (36.8 C)  TempSrc:  Oral Oral   SpO2: 100% 100% 100% 100%  Weight:    72.8 kg  Height:        Intake/Output Summary (Last 24 hours) at 02/23/2021 0746 Last data filed at 02/23/2021 0600 Gross per 24 hour  Intake 450 ml  Output --  Net 450 ml   Filed Weights   02/22/21 1406 02/23/21 0533  Weight: 79.6 kg 72.8 kg     Data Reviewed:   CBC: Recent Labs  Lab 02/21/21 1452 02/22/21 1442  WBC 10.4 7.5  NEUTROABS 7.6  --   HGB 11.5* 10.9*  HCT 36.2* 34.5*  MCV 91.2 92.2  PLT 84* 570*   Basic Metabolic Panel: Recent Labs   Lab 02/21/21 1452 02/22/21 1442  NA 137 134*  K 3.8 3.6  CL 93* 93*  CO2 32 34*  GLUCOSE 239* 337*  BUN 29* 32*  CREATININE 1.52* 1.39*  CALCIUM 9.5 9.0   GFR: Estimated Creatinine Clearance: 59.6 mL/min (A) (by C-G formula based on SCr of 1.39 mg/dL (H)). Liver Function Tests: Recent Labs  Lab 02/21/21 1452  AST 21  ALT 19  ALKPHOS 190*  BILITOT 0.5  PROT 6.7  ALBUMIN 3.2*   No results for input(s): LIPASE, AMYLASE in the last 168 hours. No results for input(s): AMMONIA in the last 168 hours. Coagulation Profile: Recent Labs  Lab 02/22/21 1442  INR 1.1   Cardiac Enzymes: No results for input(s): CKTOTAL, CKMB, CKMBINDEX, TROPONINI in the last 168 hours. BNP (last 3 results) No results for input(s): PROBNP in the last 8760 hours. HbA1C: No results for input(s): HGBA1C in the last 72 hours. CBG: No results for input(s): GLUCAP in the last 168 hours. Lipid Profile: No results for input(s): CHOL, HDL, LDLCALC, TRIG, CHOLHDL, LDLDIRECT in the last 72 hours. Thyroid Function Tests: No results for input(s): TSH, T4TOTAL, FREET4, T3FREE, THYROIDAB in the last 72 hours. Anemia Panel: No results for input(s): VITAMINB12, FOLATE, FERRITIN, TIBC, IRON, RETICCTPCT in the last  72 hours. Sepsis Labs: No results for input(s): PROCALCITON, LATICACIDVEN in the last 168 hours.  Recent Results (from the past 240 hour(s))  Resp Panel by RT-PCR (Flu A&B, Covid) Nasopharyngeal Swab     Status: None   Collection Time: 02/22/21  2:42 PM   Specimen: Nasopharyngeal Swab; Nasopharyngeal(NP) swabs in vial transport medium  Result Value Ref Range Status   SARS Coronavirus 2 by RT PCR NEGATIVE NEGATIVE Final    Comment: (NOTE) SARS-CoV-2 target nucleic acids are NOT DETECTED.  The SARS-CoV-2 RNA is generally detectable in upper respiratory specimens during the acute phase of infection. The lowest concentration of SARS-CoV-2 viral copies this assay can detect is 138 copies/mL. A  negative result does not preclude SARS-Cov-2 infection and should not be used as the sole basis for treatment or other patient management decisions. A negative result may occur with  improper specimen collection/handling, submission of specimen other than nasopharyngeal swab, presence of viral mutation(s) within the areas targeted by this assay, and inadequate number of viral copies(<138 copies/mL). A negative result must be combined with clinical observations, patient history, and epidemiological information. The expected result is Negative.  Fact Sheet for Patients:  EntrepreneurPulse.com.au  Fact Sheet for Healthcare Providers:  IncredibleEmployment.be  This test is no t yet approved or cleared by the [place] FDA and  has been authorized for detection and/or diagnosis of SARS-CoV-2 by FDA under an Emergency Use Authorization (EUA). This EUA will remain  in effect (meaning this test can be used) for the duration of the COVID-19 declaration under Section 564(b)(1) of the Act, 21 U.S.C.section 360bbb-3(b)(1), unless the authorization is terminated  or revoked sooner.       Influenza A by PCR NEGATIVE NEGATIVE Final   Influenza B by PCR NEGATIVE NEGATIVE Final    Comment: (NOTE) The Xpert Xpress SARS-CoV-2/FLU/RSV plus assay is intended as an aid in the diagnosis of influenza from Nasopharyngeal swab specimens and should not be used as a sole basis for treatment. Nasal washings and aspirates are unacceptable for Xpert Xpress SARS-CoV-2/FLU/RSV testing.  Fact Sheet for Patients: EntrepreneurPulse.com.au  Fact Sheet for Healthcare Providers: IncredibleEmployment.be  This test is not yet approved or cleared by the [place] FDA and has been authorized for detection and/or diagnosis of SARS-CoV-2 by FDA under an Emergency Use Authorization (EUA). This EUA will remain in effect (meaning this test can  be used) for the duration of the COVID-19 declaration under Section 564(b)(1) of the Act, 21 U.S.C. section 360bbb-3(b)(1), unless the authorization is terminated or revoked.  Performed at [facility], [place] [address], [place], [place] [address]          Radiology Studies: CT Chest W Contrast  Result Date: 02/22/2021 CLINICAL DATA:  Primary Cancer Type: Lung Imaging Indication: Assess response to therapy Interval therapy since last imaging? Yes Initial Cancer Diagnosis Date: 12/28/2019; Established by: Presumed Detailed Pathology: Extensive stage small cell lung cancer. Primary Tumor location: Right middle lobe.  Brain metastasis. Surgeries: ICD.  Cholecystectomy. Chemotherapy: Yes; Ongoing? Yes; Most recent administration: 02/05/2021 Immunotherapy?  Yes; Type: Imfinzi; Ongoing? No Radiation therapy? Yes; Date Range: 01/04/2020 - 01/17/2020; Target: Brain EXAM: CT CHEST, ABDOMEN, AND PELVIS WITH CONTRAST TECHNIQUE: Multidetector CT imaging of the chest, abdomen and pelvis was performed following the standard protocol during bolus administration of intravenous contrast. CONTRAST:  70mL OMNIPAQUE IOHEXOL 300 MG/ML  SOLN COMPARISON:  Most recent CT chest, abdomen and pelvis 12/24/2020. 01/17/2020 PET-CT. FINDINGS: CT CHEST FINDINGS Cardiovascular: Left chest multi  lead pacer defibrillator. Right chest port catheter. Aortic atherosclerosis. Global cardiomegaly. Three-vessel coronary artery calcifications and/or stents. No pericardial effusion. Incidental note of new bilateral pulmonary embolism, noted in the distal right pulmonary artery and more distal vessels as well as in the lobar to segmental vessels of the left lower lobe. Mediastinum/Nodes: Interval decrease in size of previously enlarged pretracheal, subcarinal, and right hilar lymph nodes, largest pretracheal node measuring 2.0 x 1.0 cm, previously 2.3 x 1.8 cm when measured similarly (series 3, image 25). Thyroid gland,  trachea, and esophagus demonstrate no significant findings. Lungs/Pleura: Moderate centrilobular emphysema. Diffuse bilateral bronchial wall thickening. Interval resolution of previously seen small bilateral pleural effusions. Interval decrease in size of a spiculated nodule of the posterior lateral segment right middle lobe abutting the major fissure, measuring 1.3 x 0.7 cm previously 1.6 x 1.0 cm when measured similarly (series 8, image 102). Musculoskeletal: No chest wall mass or suspicious bone lesions identified. CT ABDOMEN PELVIS FINDINGS Hepatobiliary: No focal liver abnormality is seen. Status post cholecystectomy. No biliary dilatation. Pancreas: Unremarkable. No pancreatic ductal dilatation or surrounding inflammatory changes. Spleen: Normal in size without significant abnormality. Adrenals/Urinary Tract: Stable, definitively benign right adrenal adenoma (series 3, image 60). Punctuate nonobstructive calculus of the inferior pole of the right kidney. No ureteral calculus or hydronephrosis. Thickening of the urinary bladder wall. Stomach/Bowel: Stomach is within normal limits. Appendix appears normal. No evidence of bowel wall thickening, distention, or inflammatory changes. Vascular/Lymphatic: Aortic atherosclerosis. No enlarged abdominal or pelvic lymph nodes. Reproductive: No mass or other abnormality. Other: No abdominal wall hernia or abnormality. No abdominopelvic ascites. Musculoskeletal: No acute or significant osseous findings. IMPRESSION: 1. Interval decrease in size of a spiculated nodule of the posterior lateral segment right middle lobe. 2. Interval decrease in size of previously enlarged pretracheal, subcarinal, and right hilar lymph nodes. 3. Interval resolution of previously seen small bilateral pleural effusions. 4. Findings are consistent with treatment response. 5. Incidental note of new bilateral pulmonary embolism, noted in the distal right pulmonary artery and more distal vessels as  well as in the lobar to segmental vessels of the left lower lobe. 6. No evidence of metastatic disease in the abdomen or pelvis. 7. Nonobstructive right nephrolithiasis. 8. New thickening of the urinary bladder wall, which may be due to nonspecific infectious or inflammatory cystitis. Correlate with urinalysis. 9. Emphysema. 10. Cardiomegaly and coronary artery disease. Call report request to [place] was placed at the time of interpretation. Final communication will be documented. Aortic Atherosclerosis (ICD10-I70.0) and Emphysema (ICD10-J43.9). Electronically Signed   By: Eddie Candle M.D.   On: 02/22/2021 11:11   CT Abdomen Pelvis W Contrast  Result Date: 02/22/2021 CLINICAL DATA:  Primary Cancer Type: Lung Imaging Indication: Assess response to therapy Interval therapy since last imaging? Yes Initial Cancer Diagnosis Date: 12/28/2019; Established by: Presumed Detailed Pathology: Extensive stage small cell lung cancer. Primary Tumor location: Right middle lobe.  Brain metastasis. Surgeries: ICD.  Cholecystectomy. Chemotherapy: Yes; Ongoing? Yes; Most recent administration: 02/05/2021 Immunotherapy?  Yes; Type: Imfinzi; Ongoing? No Radiation therapy? Yes; Date Range: 01/04/2020 - 01/17/2020; Target: Brain EXAM: CT CHEST, ABDOMEN, AND PELVIS WITH CONTRAST TECHNIQUE: Multidetector CT imaging of the chest, abdomen and pelvis was performed following the standard protocol during bolus administration of intravenous contrast. CONTRAST:  46mL OMNIPAQUE IOHEXOL 300 MG/ML  SOLN COMPARISON:  Most recent CT chest, abdomen and pelvis 12/24/2020. 01/17/2020 PET-CT. FINDINGS: CT CHEST FINDINGS Cardiovascular: Left chest multi lead pacer defibrillator. Right chest port catheter. Aortic  atherosclerosis. Global cardiomegaly. Three-vessel coronary artery calcifications and/or stents. No pericardial effusion. Incidental note of new bilateral pulmonary embolism, noted in the distal right pulmonary artery and more  distal vessels as well as in the lobar to segmental vessels of the left lower lobe. Mediastinum/Nodes: Interval decrease in size of previously enlarged pretracheal, subcarinal, and right hilar lymph nodes, largest pretracheal node measuring 2.0 x 1.0 cm, previously 2.3 x 1.8 cm when measured similarly (series 3, image 25). Thyroid gland, trachea, and esophagus demonstrate no significant findings. Lungs/Pleura: Moderate centrilobular emphysema. Diffuse bilateral bronchial wall thickening. Interval resolution of previously seen small bilateral pleural effusions. Interval decrease in size of a spiculated nodule of the posterior lateral segment right middle lobe abutting the major fissure, measuring 1.3 x 0.7 cm previously 1.6 x 1.0 cm when measured similarly (series 8, image 102). Musculoskeletal: No chest wall mass or suspicious bone lesions identified. CT ABDOMEN PELVIS FINDINGS Hepatobiliary: No focal liver abnormality is seen. Status post cholecystectomy. No biliary dilatation. Pancreas: Unremarkable. No pancreatic ductal dilatation or surrounding inflammatory changes. Spleen: Normal in size without significant abnormality. Adrenals/Urinary Tract: Stable, definitively benign right adrenal adenoma (series 3, image 60). Punctuate nonobstructive calculus of the inferior pole of the right kidney. No ureteral calculus or hydronephrosis. Thickening of the urinary bladder wall. Stomach/Bowel: Stomach is within normal limits. Appendix appears normal. No evidence of bowel wall thickening, distention, or inflammatory changes. Vascular/Lymphatic: Aortic atherosclerosis. No enlarged abdominal or pelvic lymph nodes. Reproductive: No mass or other abnormality. Other: No abdominal wall hernia or abnormality. No abdominopelvic ascites. Musculoskeletal: No acute or significant osseous findings. IMPRESSION: 1. Interval decrease in size of a spiculated nodule of the posterior lateral segment right middle lobe. 2. Interval decrease in  size of previously enlarged pretracheal, subcarinal, and right hilar lymph nodes. 3. Interval resolution of previously seen small bilateral pleural effusions. 4. Findings are consistent with treatment response. 5. Incidental note of new bilateral pulmonary embolism, noted in the distal right pulmonary artery and more distal vessels as well as in the lobar to segmental vessels of the left lower lobe. 6. No evidence of metastatic disease in the abdomen or pelvis. 7. Nonobstructive right nephrolithiasis. 8. New thickening of the urinary bladder wall, which may be due to nonspecific infectious or inflammatory cystitis. Correlate with urinalysis. 9. Emphysema. 10. Cardiomegaly and coronary artery disease. Call report request to [place] was placed at the time of interpretation. Final communication will be documented. Aortic Atherosclerosis (ICD10-I70.0) and Emphysema (ICD10-J43.9). Electronically Signed   By: Eddie Candle M.D.   On: 02/22/2021 11:11   ECHOCARDIOGRAM COMPLETE  Result Date: 02/22/2021    ECHOCARDIOGRAM REPORT   Patient Name:   Dennis [name]. Date of Exam: 02/22/2021 Medical Rec #:  [id number]       Height:       72.0 in Accession #:    [id number]      Weight:       175.4 lb Date of Birth:  10-19-1960        BSA:          2.015 m Patient Age:    57 years        BP:           108/80 mmHg Patient Gender: M               HR:           93 bpm. Exam Location:  Inpatient Procedure: 2D Echo, Color Doppler and Cardiac Doppler  Indications:    Pulmonary embolus  History:        Patient has prior history of Echocardiogram examinations, most                 recent 12/26/2020. CHF, CAD; Risk Factors:Diabetes.  Sonographer:    Cammy Brochure Referring Phys: [id number] [place]  1. Left ventricular ejection fraction, by estimation, is <20%. The left ventricle has severely decreased function. The left ventricle demonstrates global hypokinesis. The left ventricular internal cavity size was  mildly dilated. There is mild left ventricular hypertrophy. Left ventricular diastolic parameters are indeterminate.  2. Right ventricular systolic function is moderately reduced. The right ventricular size is mildly enlarged. Tricuspid regurgitation signal is inadequate for assessing PA pressure.  3. Left atrial size was mildly dilated.  4. The mitral valve is normal in structure. Trivial mitral valve regurgitation. No evidence of mitral stenosis.  5. The tricuspid valve is abnormal. There is a 2.4 x 1.4 cm vegetation that appears to be attached to the tricuspid valve as well as vegetation attached to the ICD lead as it transits the tricuspid valve. The vegetation is not completely visualized on this study, but I think this looks more like endocarditis than a sterile thrombus in transit. Would followup with TEE.  6. The aortic valve is tricuspid. Aortic valve regurgitation is trivial.  7. Aortic dilatation noted. There is mild dilatation of the aortic root, measuring 37 mm.  8. The inferior vena cava is normal in size with greater than 50% respiratory variability, suggesting right atrial pressure of 3 mmHg. FINDINGS  Left Ventricle: Left ventricular ejection fraction, by estimation, is <20%. The left ventricle has severely decreased function. The left ventricle demonstrates global hypokinesis. The left ventricular internal cavity size was mildly dilated. There is mild left ventricular hypertrophy. Left ventricular diastolic parameters are indeterminate. Right Ventricle: The right ventricular size is mildly enlarged. No increase in right ventricular wall thickness. Right ventricular systolic function is moderately reduced. Tricuspid regurgitation signal is inadequate for assessing PA pressure. Left Atrium: Left atrial size was mildly dilated. Right Atrium: Right atrial size was normal in size. Pericardium: There is no evidence of pericardial effusion. Mitral Valve: The mitral valve is normal in structure. Trivial  mitral valve regurgitation. No evidence of mitral valve stenosis. Tricuspid Valve: The tricuspid valve is abnormal. Tricuspid valve regurgitation is trivial. Aortic Valve: The aortic valve is tricuspid. Aortic valve regurgitation is trivial. Aortic valve mean gradient measures 2.0 mmHg. Aortic valve peak gradient measures 3.3 mmHg. Aortic valve area, by VTI measures 2.18 cm. Pulmonic Valve: The pulmonic valve was normal in structure. Pulmonic valve regurgitation is not visualized. Aorta: Aortic dilatation noted. There is mild dilatation of the aortic root, measuring 37 mm. Venous: The inferior vena cava is normal in size with greater than 50% respiratory variability, suggesting right atrial pressure of 3 mmHg. IAS/Shunts: No atrial level shunt detected by color flow Doppler. Additional Comments: A device lead is visualized in the right ventricle.  LEFT VENTRICLE PLAX 2D LVIDd:         6.09 cm LVIDs:         5.42 cm LV PW:         1.31 cm LV IVS:        1.31 cm LVOT diam:     2.40 cm LV SV:         27 LV SV Index:   13 LVOT Area:     4.52 cm  RIGHT  VENTRICLE RV Basal diam:  4.60 cm LEFT ATRIUM             Index       RIGHT ATRIUM           Index LA diam:        2.50 cm 1.24 cm/m  RA Area:     15.60 cm LA Vol (A2C):   49.2 ml 24.39 ml/m RA Volume:   39.00 ml  19.36 ml/m LA Vol (A4C):   54.5 ml 27.05 ml/m LA Biplane Vol: 58.8 ml 29.18 ml/m  AORTIC VALVE AV Area (Vmax):    2.22 cm AV Area (Vmean):   2.45 cm AV Area (VTI):     2.18 cm AV Vmax:           90.50 cm/s AV Vmean:          60.600 cm/s AV VTI:            0.124 m AV Peak Grad:      3.3 mmHg AV Mean Grad:      2.0 mmHg LVOT Vmax:         44.40 cm/s LVOT Vmean:        32.800 cm/s LVOT VTI:          0.060 m LVOT/AV VTI ratio: 0.48  AORTA Ao Root diam: 3.70 cm Ao Asc diam:  3.60 cm MITRAL VALVE MV Area (PHT): 6.17 cm     SHUNTS MV Decel Time: 123 msec     Systemic VTI:  0.06 m MV E velocity: 101.00 cm/s  Systemic Diam: 2.40 cm Loralie Champagne MD  Electronically signed by Loralie Champagne MD Signature Date/Time: 02/22/2021/4:32:59 PM    Final         Scheduled Meds: . atorvastatin  20 mg Oral Daily  . Chlorhexidine Gluconate Cloth  6 each Topical Daily  . dexamethasone  1 mg Oral Daily  . enoxaparin (LOVENOX) injection  1 mg/kg Subcutaneous BID  . furosemide  20 mg Oral Daily  . insulin aspart  6 Units Subcutaneous TID AC  . insulin glargine  25 Units Subcutaneous BID  . midodrine  10 mg Oral TID WC  . mometasone-formoterol  2 puff Inhalation BID  . senna  1 tablet Oral BID  . tamsulosin  0.4 mg Oral Daily  . umeclidinium bromide  1 puff Inhalation Daily   Continuous Infusions: . ceFEPime (MAXIPIME) IV 2 g (02/23/21 0238)  . vancomycin 750 mg (02/23/21 0535)     LOS: 1 day   Time spent= 35 mins    Alegandro Macnaughton Arsenio Loader, MD [facility]  If 7PM-7AM, please contact night-coverage  02/23/2021, 7:46 AM

## 2021-02-24 DIAGNOSIS — Z95810 Presence of automatic (implantable) cardiac defibrillator: Secondary | ICD-10-CM | POA: Diagnosis not present

## 2021-02-24 DIAGNOSIS — I255 Ischemic cardiomyopathy: Secondary | ICD-10-CM | POA: Diagnosis not present

## 2021-02-24 DIAGNOSIS — I339 Acute and subacute endocarditis, unspecified: Secondary | ICD-10-CM

## 2021-02-24 DIAGNOSIS — I38 Endocarditis, valve unspecified: Secondary | ICD-10-CM | POA: Diagnosis not present

## 2021-02-24 LAB — GLUCOSE, CAPILLARY
Glucose-Capillary: 206 mg/dL — ABNORMAL HIGH (ref 70–99)
Glucose-Capillary: 261 mg/dL — ABNORMAL HIGH (ref 70–99)
Glucose-Capillary: 169 mg/dL — ABNORMAL HIGH (ref 70–99)
Glucose-Capillary: 131 mg/dL — ABNORMAL HIGH (ref 70–99)

## 2021-02-24 LAB — BASIC METABOLIC PANEL
Anion gap: 10 (ref 5–15)
BUN: 24 mg/dL — ABNORMAL HIGH (ref 6–20)
CO2: 29 mmol/L (ref 22–32)
Calcium: 8.7 mg/dL — ABNORMAL LOW (ref 8.9–10.3)
Chloride: 99 mmol/L (ref 98–111)
Creatinine, Ser: 1.05 mg/dL (ref 0.61–1.24)
GFR, Estimated: >60 mL/min (ref >60)
Glucose, Bld: 268 mg/dL — ABNORMAL HIGH (ref 70–99)
Potassium: 3.8 mmol/L (ref 3.5–5.1)
Sodium: 138 mmol/L (ref 135–145)

## 2021-02-24 LAB — URINE CULTURE: Culture: >=100000 — AB

## 2021-02-24 LAB — CBC
HCT: 32 % — ABNORMAL LOW (ref 39.0–52.0)
Hemoglobin: 9.7 g/dL — ABNORMAL LOW (ref 13.0–17.0)
MCH: 29 pg (ref 26.0–34.0)
MCHC: 30.3 g/dL (ref 30.0–36.0)
MCV: 95.5 fL (ref 80.0–100.0)
Platelets: 160 10*3/uL (ref 150–400)
RBC: 3.35 MIL/uL — ABNORMAL LOW (ref 4.22–5.81)
RDW: 17.7 % — ABNORMAL HIGH (ref 11.5–15.5)
WBC: 5.9 10*3/uL (ref 4.0–10.5)
nRBC: 0.3 % — ABNORMAL HIGH (ref 0.0–0.2)

## 2021-02-24 LAB — MAGNESIUM: Magnesium: 1.9 mg/dL (ref 1.7–2.4)

## 2021-02-24 MED ORDER — INSULIN ASPART 100 UNIT/ML IJ SOLN
0.0000 [IU] | Freq: Three times a day (TID) | INTRAMUSCULAR | Status: DC
  Administered 2021-02-24: 2 [IU] via SUBCUTANEOUS
  Administered 2021-02-24: 5 [IU] via SUBCUTANEOUS
  Administered 2021-02-25 – 2021-02-26 (×3): 3 [IU] via SUBCUTANEOUS
  Administered 2021-02-26: 1 [IU] via SUBCUTANEOUS
  Administered 2021-02-26: 2 [IU] via SUBCUTANEOUS
  Administered 2021-02-27 (×2): 3 [IU] via SUBCUTANEOUS
  Administered 2021-02-27: 2 [IU] via SUBCUTANEOUS

## 2021-02-24 MED ORDER — INSULIN ASPART 100 UNIT/ML IJ SOLN
0.0000 [IU] | Freq: Every day | INTRAMUSCULAR | Status: DC
  Administered 2021-02-26: 2 [IU] via SUBCUTANEOUS

## 2021-02-24 NOTE — Progress Notes (Addendum)
Subjective: No new complaints hs is concerned re "UTI"    Antibiotics:  Anti-infectives (From admission, onward)   Start     Dose/Rate Route Frequency Ordered Stop   02/23/21 2300  cefTRIAXone (ROCEPHIN) 2 g in sodium chloride 0.9 % 100 mL IVPB        2 g 200 mL/hr over 30 Minutes Intravenous Every 24 hours 02/23/21 1908     02/23/21 0600  vancomycin (VANCOREADY) IVPB 750 mg/150 mL        750 mg 150 mL/hr over 60 Minutes Intravenous 2 times daily 02/22/21 2058     02/22/21 1815  vancomycin (VANCOREADY) IVPB 1500 mg/300 mL        1,500 mg 150 mL/hr over 120 Minutes Intravenous  Once 02/22/21 1800 02/22/21 2050   02/22/21 1815  ceFEPIme (MAXIPIME) 2 g in sodium chloride 0.9 % 100 mL IVPB  Status:  Discontinued        2 g 200 mL/hr over 30 Minutes Intravenous Every 8 hours 02/22/21 1800 02/23/21 1908      Medications: Scheduled Meds: . atorvastatin  20 mg Oral Daily  . Chlorhexidine Gluconate Cloth  6 each Topical Daily  . dexamethasone  1 mg Oral Daily  . enoxaparin (LOVENOX) injection  1 mg/kg Subcutaneous BID  . furosemide  20 mg Oral Daily  . insulin aspart  0-5 Units Subcutaneous QHS  . insulin aspart  0-9 Units Subcutaneous TID WC  . insulin aspart  6 Units Subcutaneous TID AC  . insulin glargine  25 Units Subcutaneous BID  . midodrine  10 mg Oral TID WC  . mometasone-formoterol  2 puff Inhalation BID  . senna  1 tablet Oral BID  . sodium chloride flush  10-40 mL Intracatheter Q12H  . tamsulosin  0.4 mg Oral Daily  . umeclidinium bromide  1 puff Inhalation Daily   Continuous Infusions: . cefTRIAXone (ROCEPHIN)  IV 2 g (02/23/21 2230)  . vancomycin 750 mg (02/24/21 1729)   PRN Meds:.acetaminophen **OR** acetaminophen, bisacodyl, guaiFENesin, hydrALAZINE, HYDROcodone-acetaminophen, ipratropium-albuterol, methocarbamol, ondansetron **OR** ondansetron (ZOFRAN) IV, prochlorperazine, senna-docusate, sodium chloride, sodium chloride  flush    Objective: Weight change: -7.361 kg  Intake/Output Summary (Last 24 hours) at 02/24/2021 1826 Last data filed at 02/24/2021 0600 Gross per 24 hour  Intake --  Output 1300 ml  Net -1300 ml   Blood pressure 106/79, pulse 96, temperature 98 F (36.7 C), temperature source Oral, resp. rate 16, height 6' (1.829 m), weight 72.2 kg, SpO2 100 %. Temp:  [97.7 F (36.5 C)-98.5 F (36.9 C)] 98 F (36.7 C) (05/15 1341) Pulse Rate:  [92-96] 96 (05/15 1341) Resp:  [16-19] 16 (05/15 1341) BP: (101-106)/(79-80) 106/79 (05/15 1341) SpO2:  [97 %-100 %] 100 % (05/15 1341) Weight:  [72.2 kg] 72.2 kg (05/15 0558)  Physical Exam: Physical Exam Constitutional:      Appearance: He is well-developed.  HENT:     Head: Normocephalic and atraumatic.  Eyes:     Conjunctiva/sclera: Conjunctivae normal.  Cardiovascular:     Rate and Rhythm: Normal rate and regular rhythm.     Heart sounds: No murmur heard. No friction rub. No gallop.   Pulmonary:     Effort: Pulmonary effort is normal. No respiratory distress.     Breath sounds: No stridor. No wheezing.  Abdominal:     General: There is no distension.     Palpations: Abdomen is soft.  Musculoskeletal:  General: Normal range of motion.     Cervical back: Normal range of motion and neck supple.  Skin:    General: Skin is warm and dry.     Findings: No erythema or rash.  Neurological:     General: No focal deficit present.     Mental Status: He is alert and oriented to person, place, and time.  Psychiatric:        Mood and Affect: Mood normal.        Behavior: Behavior normal.        Thought Content: Thought content normal.        Judgment: Judgment normal.      CBC:    BMET Recent Labs    02/23/21 0500 02/24/21 0320  NA 133* 138  K 3.1* 3.8  CL 94* 99  CO2 29 29  GLUCOSE 286* 268*  BUN 30* 24*  CREATININE 1.41* 1.05  CALCIUM 8.4* 8.7*     Liver Panel  No results for input(s): PROT, ALBUMIN, AST, ALT,  ALKPHOS, BILITOT, BILIDIR, IBILI in the last 72 hours.     Sedimentation Rate No results for input(s): ESRSEDRATE in the last 72 hours. C-Reactive Protein No results for input(s): CRP in the last 72 hours.  Micro Results: Recent Results (from the past 720 hour(s))  Resp Panel by RT-PCR (Flu A&B, Covid) Nasopharyngeal Swab     Status: None   Collection Time: 02/22/21  2:42 PM   Specimen: Nasopharyngeal Swab; Nasopharyngeal(NP) swabs in vial transport medium  Result Value Ref Range Status   SARS Coronavirus 2 by RT PCR NEGATIVE NEGATIVE Final    Comment: (NOTE) SARS-CoV-2 target nucleic acids are NOT DETECTED.  The SARS-CoV-2 RNA is generally detectable in upper respiratory specimens during the acute phase of infection. The lowest concentration of SARS-CoV-2 viral copies this assay can detect is 138 copies/mL. A negative result does not preclude SARS-Cov-2 infection and should not be used as the sole basis for treatment or other patient management decisions. A negative result may occur with  improper specimen collection/handling, submission of specimen other than nasopharyngeal swab, presence of viral mutation(s) within the areas targeted by this assay, and inadequate number of viral copies(<138 copies/mL). A negative result must be combined with clinical observations, patient history, and epidemiological information. The expected result is Negative.  Fact Sheet for Patients:  EntrepreneurPulse.com.au  Fact Sheet for Healthcare Providers:  IncredibleEmployment.be  This test is no t yet approved or cleared by the [place] FDA and  has been authorized for detection and/or diagnosis of SARS-CoV-2 by FDA under an Emergency Use Authorization (EUA). This EUA will remain  in effect (meaning this test can be used) for the duration of the COVID-19 declaration under Section 564(b)(1) of the Act, 21 U.S.C.section 360bbb-3(b)(1), unless the  authorization is terminated  or revoked sooner.       Influenza A by PCR NEGATIVE NEGATIVE Final   Influenza B by PCR NEGATIVE NEGATIVE Final    Comment: (NOTE) The Xpert Xpress SARS-CoV-2/FLU/RSV plus assay is intended as an aid in the diagnosis of influenza from Nasopharyngeal swab specimens and should not be used as a sole basis for treatment. Nasal washings and aspirates are unacceptable for Xpert Xpress SARS-CoV-2/FLU/RSV testing.  Fact Sheet for Patients: EntrepreneurPulse.com.au  Fact Sheet for Healthcare Providers: IncredibleEmployment.be  This test is not yet approved or cleared by the [place] FDA and has been authorized for detection and/or diagnosis of SARS-CoV-2 by FDA under an Emergency Use Authorization (EUA).  This EUA will remain in effect (meaning this test can be used) for the duration of the COVID-19 declaration under Section 564(b)(1) of the Act, 21 U.S.C. section 360bbb-3(b)(1), unless the authorization is terminated or revoked.  Performed at [facility], [place] [address], [place], [place] [address]   Urine culture     Status: Abnormal   Collection Time: 02/22/21  4:28 PM   Specimen: Urine, Random  Result Value Ref Range Status   Specimen Description   Final    URINE, RANDOM Performed at [facility] [address] [place], [place] [address]    Special Requests   Final    NONE Performed at [facility], [place] [address] [place], [place] [address]    Culture >=100,000 COLONIES/mL ESCHERICHIA COLI (A)  Final   Report Status 02/24/2021 FINAL  Final   Organism ID, Bacteria ESCHERICHIA COLI (A)  Final      Susceptibility   Escherichia coli - MIC*    AMPICILLIN >=32 RESISTANT Resistant     CEFAZOLIN 16 SENSITIVE Sensitive     CEFEPIME <=0.12 SENSITIVE Sensitive     CEFTRIAXONE <=0.25 SENSITIVE Sensitive     CIPROFLOXACIN <=0.25 SENSITIVE Sensitive      GENTAMICIN <=1 SENSITIVE Sensitive     IMIPENEM 0.5 SENSITIVE Sensitive     NITROFURANTOIN <=16 SENSITIVE Sensitive     TRIMETH/SULFA <=20 SENSITIVE Sensitive     AMPICILLIN/SULBACTAM >=32 RESISTANT Resistant     PIP/TAZO <=4 SENSITIVE Sensitive     * >=100,000 COLONIES/mL ESCHERICHIA COLI  Culture, blood (routine x 2)     Status: None (Preliminary result)   Collection Time: 02/22/21  4:49 PM   Specimen: BLOOD  Result Value Ref Range Status   Specimen Description   Final    BLOOD CHEST UPPER Performed at [facility] [address] [place], [place] [address]    Special Requests   Final    BOTTLES DRAWN AEROBIC AND ANAEROBIC Blood Culture adequate volume Performed at [facility] [address] [place], [place] [address]    Culture   Final    NO GROWTH 2 DAYS Performed at [facility] [address], [place], [place] [address]    Report Status PENDING  Incomplete  Culture, blood (routine x 2)     Status: None (Preliminary result)   Collection Time: 02/22/21  4:49 PM   Specimen: BLOOD  Result Value Ref Range Status   Specimen Description   Final    BLOOD CHEST UPPER Performed at [facility] [address], [place], [place] [address]    Special Requests   Final    BOTTLES DRAWN AEROBIC AND ANAEROBIC Blood Culture adequate volume Performed at [facility] [address] [place], [place] [address]    Culture   Final    NO GROWTH 2 DAYS Performed at [facility] [address] [place], [place] [address]    Report Status PENDING  Incomplete    Studies/Results: VAS [place] LOWER EXTREMITY VENOUS (DVT)  Result Date: 02/23/2021  Lower Venous DVT Study Patient Name:  Dennis [name].  Date of Exam:   02/23/2021 Medical Rec #: [id number]        Accession #:    [id number] Date of Birth: 06/24/1961         Patient Gender: M Patient Age:   59Y Exam Location:  [facility] Procedure:  VAS [place] LOWER EXTREMITY VENOUS (DVT) Referring Phys: [id number] ANKIT CHIRAG AMIN --------------------------------------------------------------------------------  Indications: PE.  Risk Factors: Decreased mobility Cancer (Lung). Anticoagulation: Patient was put on Xarelto 12/2020 after blood clot but stopped after only 1 month (unclear why). Comparison Study: Previous exam 12/19/20 - Positive DVT LLE popliteal Performing Technologist: Rogelia Rohrer  Examination Guidelines: A complete evaluation includes B-mode imaging, spectral Doppler, color Doppler, and power Doppler as needed of all accessible portions of each vessel. Bilateral testing is considered an integral part of a complete examination. Limited examinations for reoccurring indications may be performed as noted. The reflux portion of the exam is performed with the patient in reverse Trendelenburg.  +---------+---------------+---------+-----------+----------+--------------+ RIGHT    CompressibilityPhasicitySpontaneityPropertiesThrombus Aging +---------+---------------+---------+-----------+----------+--------------+ CFV      Full           Yes      Yes                                 +---------+---------------+---------+-----------+----------+--------------+ SFJ      Full                                                        +---------+---------------+---------+-----------+----------+--------------+ FV Prox  Full           Yes      Yes                                 +---------+---------------+---------+-----------+----------+--------------+ FV Mid   Full           Yes      Yes                                 +---------+---------------+---------+-----------+----------+--------------+ FV DistalFull           Yes      Yes                                 +---------+---------------+---------+-----------+----------+--------------+ PFV      Full                                                         +---------+---------------+---------+-----------+----------+--------------+ POP      Full           Yes      Yes                                 +---------+---------------+---------+-----------+----------+--------------+ PTV      Full                                                        +---------+---------------+---------+-----------+----------+--------------+ PERO     Full                                                        +---------+---------------+---------+-----------+----------+--------------+   +---------+---------------+---------+-----------+----------+------------------+  LEFT     CompressibilityPhasicitySpontaneityPropertiesThrombus Aging     +---------+---------------+---------+-----------+----------+------------------+ CFV      Partial        Yes      Yes                  Acute              +---------+---------------+---------+-----------+----------+------------------+ SFJ      Partial                                      Acute - mobile                                                           thrombus           +---------+---------------+---------+-----------+----------+------------------+ FV Prox  None           No       No                   Acute              +---------+---------------+---------+-----------+----------+------------------+ FV Mid   None           No       No                   Acute              +---------+---------------+---------+-----------+----------+------------------+ FV DistalNone           No       No                   Acute              +---------+---------------+---------+-----------+----------+------------------+ PFV      Full                                                            +---------+---------------+---------+-----------+----------+------------------+ POP      None           No       No                   Chronic             +---------+---------------+---------+-----------+----------+------------------+ PTV      None           No       No                   Acute              +---------+---------------+---------+-----------+----------+------------------+ PERO     Partial        No       No                   Acute              +---------+---------------+---------+-----------+----------+------------------+ Gastroc  None           No  No                   Acute              +---------+---------------+---------+-----------+----------+------------------+ SSV      Partial        No       No                   Chronic            +---------+---------------+---------+-----------+----------+------------------+ EIV      Full           Yes      Yes                  Not visualized     +---------+---------------+---------+-----------+----------+------------------+     Summary: BILATERAL: -No evidence of popliteal cyst, bilaterally. RIGHT: - There is no evidence of deep vein thrombosis in the lower extremity. - There is no evidence of superficial venous thrombosis.  LEFT: - Findings consistent with acute deep vein thrombosis involving the left common femoral vein, SF junction, left femoral vein, left posterior tibial veins, left peroneal veins, and left gastrocnemius veins. - Findings consistent with chronic deep vein thrombosis involving the left popliteal vein. - Findings consistent with chronic superficial vein thrombosis involving the left small saphenous vein. - Findings suggest new clot progression as compared to previous examination.  *See table(s) above for measurements and observations. Electronically signed by Servando Snare MD on 02/23/2021 at 11:05:28 AM.    Final       Assessment/Plan:  INTERVAL HISTORY: blood cultures remains sterile   Principal Problem:   Marantic endocarditis Active Problems:   Type II diabetes mellitus with ophthalmic manifestations ([place])   Drug abuse and dependence  ([place])   Cardiomyopathy, ischemic   Automatic implantable cardioverter-defibrillator in situ   Chronic systolic CHF (congestive heart failure) ([place])   Hyperlipidemia associated with type 2 diabetes mellitus ([place])   Essential hypertension   Small cell lung cancer, right upper lobe ([place])   Pulmonary embolism ([place])    Dennis [name]. is a 60 y.o. male with metastatic small cell lung cancer on chemotherapy via port, with hx of DVT's on warfarin, switched to xarelto but only with 20 day script per patient and his brother sp CT to stage his lung cancer (whch has responded to chemotherapy) which showed pulmonary emboli. He has subsequently been found to have EXTENSIVE clot burden on the left, endocardial mass on tricuspid valve and ICD felt likely to be a marantic endocarditis. He did apparently have some dysuria on admission and urine cultures + E coli R only to amp/sulbactam.  Blood cultures have remained NGrowth  He is on Vancomycin and now ceftriaxone  #1 Marantic endocarditis:  This seems to be the best explanation for his findings  Reasonable to continue vanco, ceftriaxone pending blood cultures which are already no growth 2 days  #2 UTI: today he will have already received adequate treatment  I spent greater than 35 minutes with the patient including greater than 50% of time in face to face counsel of the patient in personal review of his radiographs his lab and microbiological data and in coordination of his care with primary team and Cardiology.  Dr Linus Salmons is back tomorrow.    LOS: 2 days   Alcide Evener 02/24/2021, 6:26 PM

## 2021-02-24 NOTE — Progress Notes (Signed)
PROGRESS NOTE    Dennis [name].  UVO:[id number] DOB: 05/29/1961 DOA: 02/22/2021 PCP: [name], MD   Brief Narrative:  60 y.o. male with medical history significant of metastatic small cell lung cancer on chemotherapy, systolic CHF EF 74% status post AICD, history of cocaine use, HTN, recent CVA, HLD, DM2, lower extremity DVT used to be on Xarelto, chronic hypoxia on 2 L nasal cannula comes to the hospital as he was directed by his oncologist for evaluation of pulmonary embolism.  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  Currently monitoring blood cultures and on empiric IV antibiotics.  Infectious disease and cardiology team following.   Assessment & Plan:   Principal Problem:   Marantic endocarditis Active Problems:   Type II diabetes mellitus with ophthalmic manifestations ([place])   Drug abuse and dependence ([place])   Cardiomyopathy, ischemic   Automatic implantable cardioverter-defibrillator in situ   Chronic systolic CHF (congestive heart failure) ([place])   Hyperlipidemia associated with type 2 diabetes mellitus ([place])   Essential hypertension   Small cell lung cancer, right upper lobe ([place])   Pulmonary embolism (HCC)    Tricuspid endocarditis with infected ICD lead - Empiric- vancomycin and Rocephin.  Low threshold to discontinue - Vegetation is suspicious of thrombus versus infection -  Does have a Chemo-Port in place which could be source -Blood cultures- NGTD -Cardiology and ID following  Bilateral pulmonary embolism without cor pulmonale History of DVT -  Lovenox 1 mg/kg every 12 hours - Lower extremity Dopplers- extensive left lower extremity DVT -Had been off his Xarelto for 6 weeks  Metastatic small cell lung cancer - Follows outpatient [name].  On outpatient chemotherapy.  Daily dexamethasone.  Urinary tract infection without hematuria - Urine cultures-pending - Already on  antibiotics  Chronic congestive heart failure reduced ejection fraction, EF 15% status post ICD - Not on any home medications because of hypotension.  Continue midodrine -Supportive care.  Lasix 20 mg daily  CKD stage IIIa, stable - Chronic.  Creatinine at baseline of 1.3.   Diabetes mellitus type 2 - Continue Lantus 25 units twice daily.  Insulin sliding scale and Accu-Chek - Lispro 6 units 3 times Premeal's  Chronic hypoxia on 2 L nasal cannula History of COPD/emphysema - Bronchodilators  Hyperlipidemia - Lipitor  Goals of care discussion- I had extensive discussion with the patient and his brother at bedside.  He is interested in home hospice therefore TOC consulted.  DNR/DNI   DVT prophylaxis: Lovenox 1 mg/kg Code Status: Full code Family Communication:   Status is: Inpatient  Remains inpatient appropriate because:Hemodynamically unstable   Dispo: The patient is from: Home              Anticipated d/c is to: Home with hospice              Patient currently is not medically stable to d/c.  Ongoing evaluation for endocarditis and treatment for pulmonary embolism.  Unsafe for discharge.  TOC consulted as patient is interested in home with hospice upon discharge   Difficult to place patient No     Subjective: No complaints.  Patient tells me after conversation yesterday he a chance to speak with his family and he is interested in home with hospice.  Review of Systems Otherwise negative except as per HPI, including: General: Denies fever, chills, night sweats or unintended weight loss. Resp: Denies cough, wheezing, shortness of breath. Cardiac: Denies chest pain, palpitations, orthopnea, paroxysmal  nocturnal dyspnea. GI: Denies abdominal pain, nausea, vomiting, diarrhea or constipation GU: Denies dysuria, frequency, hesitancy or incontinence MS: Denies muscle aches, joint pain or swelling Neuro: Denies headache, neurologic deficits (focal weakness, numbness,  tingling), abnormal gait Psych: Denies anxiety, depression, SI/HI/AVH Skin: Denies new rashes or lesions ID: Denies sick contacts, exotic exposures, travel   Examination: Constitutional: Not in acute distress Respiratory: Clear to auscultation bilaterally Cardiovascular: Normal sinus rhythm, no rubs Abdomen: Nontender nondistended good bowel sounds Musculoskeletal: No edema noted Skin: No rashes seen Neurologic: CN 2-12 grossly intact.  And nonfocal Psychiatric: Normal judgment and insight. Alert and oriented x 3. Normal mood.   Right chest wall Chemo-Port in place  Objective: Vitals:   02/23/21 2004 02/23/21 2036 02/24/21 0558 02/24/21 0729  BP:  101/79 102/80   Pulse:  96 92   Resp:  19 16   Temp:  97.7 F (36.5 C) 98.5 F (36.9 C)   TempSrc:  Oral Oral   SpO2: 97% 100% 100% 99%  Weight:   72.2 kg   Height:        Intake/Output Summary (Last 24 hours) at 02/24/2021 0834 Last data filed at 02/24/2021 0600 Gross per 24 hour  Intake 1030.55 ml  Output 1300 ml  Net -269.45 ml   Filed Weights   02/22/21 1406 02/23/21 0533 02/24/21 0558  Weight: 79.6 kg 72.8 kg 72.2 kg     Data Reviewed:   CBC: Recent Labs  Lab 02/21/21 1452 02/22/21 1442 02/23/21 0500 02/24/21 0320  WBC 10.4 7.5 5.3 5.9  NEUTROABS 7.6  --   --   --   HGB 11.5* 10.9* 9.6* 9.7*  HCT 36.2* 34.5* 30.9* 32.0*  MCV 91.2 92.2 93.1 95.5  PLT 84* 128* 125* 185   Basic Metabolic Panel: Recent Labs  Lab 02/21/21 1452 02/22/21 1442 02/23/21 0500 02/24/21 0320  NA 137 134* 133* 138  K 3.8 3.6 3.1* 3.8  CL 93* 93* 94* 99  CO2 32 34* 29 29  GLUCOSE 239* 337* 286* 268*  BUN 29* 32* 30* 24*  CREATININE 1.52* 1.39* 1.41* 1.05  CALCIUM 9.5 9.0 8.4* 8.7*  MG  --   --  1.7 1.9   GFR: Estimated Creatinine Clearance: 78.3 mL/min (by C-G formula based on SCr of 1.05 mg/dL). Liver Function Tests: Recent Labs  Lab 02/21/21 1452  AST 21  ALT 19  ALKPHOS 190*  BILITOT 0.5  PROT 6.7  ALBUMIN  3.2*   No results for input(s): LIPASE, AMYLASE in the last 168 hours. No results for input(s): AMMONIA in the last 168 hours. Coagulation Profile: Recent Labs  Lab 02/22/21 1442  INR 1.1   Cardiac Enzymes: No results for input(s): CKTOTAL, CKMB, CKMBINDEX, TROPONINI in the last 168 hours. BNP (last 3 results) No results for input(s): PROBNP in the last 8760 hours. HbA1C: No results for input(s): HGBA1C in the last 72 hours. CBG: Recent Labs  Lab 02/23/21 0745 02/23/21 1123 02/23/21 1623 02/23/21 2031 02/24/21 0744  GLUCAP 291* 156* 176* 218* 206*   Lipid Profile: No results for input(s): CHOL, HDL, LDLCALC, TRIG, CHOLHDL, LDLDIRECT in the last 72 hours. Thyroid Function Tests: No results for input(s): TSH, T4TOTAL, FREET4, T3FREE, THYROIDAB in the last 72 hours. Anemia Panel: No results for input(s): VITAMINB12, FOLATE, FERRITIN, TIBC, IRON, RETICCTPCT in the last 72 hours. Sepsis Labs: No results for input(s): PROCALCITON, LATICACIDVEN in the last 168 hours.  Recent Results (from the past 240 hour(s))  Resp Panel by RT-PCR (Flu A&B,  Covid) Nasopharyngeal Swab     Status: None   Collection Time: 02/22/21  2:42 PM   Specimen: Nasopharyngeal Swab; Nasopharyngeal(NP) swabs in vial transport medium  Result Value Ref Range Status   SARS Coronavirus 2 by RT PCR NEGATIVE NEGATIVE Final    Comment: (NOTE) SARS-CoV-2 target nucleic acids are NOT DETECTED.  The SARS-CoV-2 RNA is generally detectable in upper respiratory specimens during the acute phase of infection. The lowest concentration of SARS-CoV-2 viral copies this assay can detect is 138 copies/mL. A negative result does not preclude SARS-Cov-2 infection and should not be used as the sole basis for treatment or other patient management decisions. A negative result may occur with  improper specimen collection/handling, submission of specimen other than nasopharyngeal swab, presence of viral mutation(s) within  the areas targeted by this assay, and inadequate number of viral copies(<138 copies/mL). A negative result must be combined with clinical observations, patient history, and epidemiological information. The expected result is Negative.  Fact Sheet for Patients:  EntrepreneurPulse.com.au  Fact Sheet for Healthcare Providers:  IncredibleEmployment.be  This test is no t yet approved or cleared by the [place] FDA and  has been authorized for detection and/or diagnosis of SARS-CoV-2 by FDA under an Emergency Use Authorization (EUA). This EUA will remain  in effect (meaning this test can be used) for the duration of the COVID-19 declaration under Section 564(b)(1) of the Act, 21 U.S.C.section 360bbb-3(b)(1), unless the authorization is terminated  or revoked sooner.       Influenza A by PCR NEGATIVE NEGATIVE Final   Influenza B by PCR NEGATIVE NEGATIVE Final    Comment: (NOTE) The Xpert Xpress SARS-CoV-2/FLU/RSV plus assay is intended as an aid in the diagnosis of influenza from Nasopharyngeal swab specimens and should not be used as a sole basis for treatment. Nasal washings and aspirates are unacceptable for Xpert Xpress SARS-CoV-2/FLU/RSV testing.  Fact Sheet for Patients: EntrepreneurPulse.com.au  Fact Sheet for Healthcare Providers: IncredibleEmployment.be  This test is not yet approved or cleared by the [place] FDA and has been authorized for detection and/or diagnosis of SARS-CoV-2 by FDA under an Emergency Use Authorization (EUA). This EUA will remain in effect (meaning this test can be used) for the duration of the COVID-19 declaration under Section 564(b)(1) of the Act, 21 U.S.C. section 360bbb-3(b)(1), unless the authorization is terminated or revoked.  Performed at [facility], [place] [address] [place], [place] [address]   Urine culture     Status: Abnormal  (Preliminary result)   Collection Time: 02/22/21  4:28 PM   Specimen: Urine, Random  Result Value Ref Range Status   Specimen Description   Final    URINE, RANDOM Performed at [facility] [address] [place], [place] [address]    Special Requests   Final    NONE Performed at [facility], [place] [address], [place], [place] [address]    Culture (A)  Final    >=100,000 COLONIES/mL ESCHERICHIA COLI SUSCEPTIBILITIES TO FOLLOW Performed at [facility], [place] [address] [place], [place] [address]    Report Status PENDING  Incomplete  Culture, blood (routine x 2)     Status: None (Preliminary result)   Collection Time: 02/22/21  4:49 PM   Specimen: BLOOD  Result Value Ref Range Status   Specimen Description   Final    BLOOD CHEST UPPER Performed at [facility] [address] [place], [place] [address]    Special  Requests   Final    BOTTLES DRAWN AEROBIC AND ANAEROBIC Blood Culture adequate volume Performed at [facility] [address], [place], [place] [address]    Culture   Final    NO GROWTH < 24 HOURS Performed at [facility] [address] [place], [place] [address]    Report Status PENDING  Incomplete  Culture, blood (routine x 2)     Status: None (Preliminary result)   Collection Time: 02/22/21  4:49 PM   Specimen: BLOOD  Result Value Ref Range Status   Specimen Description   Final    BLOOD CHEST UPPER Performed at [facility] [address], [place], [place] [address]    Special Requests   Final    BOTTLES DRAWN AEROBIC AND ANAEROBIC Blood Culture adequate volume Performed at [facility] [address], [place], [place] [address]    Culture   Final    NO GROWTH < 24 HOURS Performed at [facility] [address], [place], [place] [address]    Report Status PENDING  Incomplete         Radiology  Studies: ECHOCARDIOGRAM COMPLETE  Result Date: 02/22/2021    ECHOCARDIOGRAM REPORT   Patient Name:   Dennis [name]. Date of Exam: 02/22/2021 Medical Rec #:  [id number]       Height:       72.0 in Accession #:    [id number]      Weight:       175.4 lb Date of Birth:  [birth year]        BSA:          2.015 m Patient Age:    60 years        BP:           108/80 mmHg Patient Gender: M               HR:           93 bpm. Exam Location:  Inpatient Procedure: 2D Echo, Color Doppler and Cardiac Doppler Indications:    Pulmonary embolus  History:        Patient has prior history of Echocardiogram examinations, most                 recent 12/26/2020. CHF, CAD; Risk Factors:Diabetes.  Sonographer:    Cammy Brochure Referring Phys: [phone] [place]  1. Left ventricular ejection fraction, by estimation, is <20%. The left ventricle has severely decreased function. The left ventricle demonstrates global hypokinesis. The left ventricular internal cavity size was mildly dilated. There is mild left ventricular hypertrophy. Left ventricular diastolic parameters are indeterminate.  2. Right ventricular systolic function is moderately reduced. The right ventricular size is mildly enlarged. Tricuspid regurgitation signal is inadequate for assessing PA pressure.  3. Left atrial size was mildly dilated.  4. The mitral valve is normal in structure. Trivial mitral valve regurgitation. No evidence of mitral stenosis.  5. The tricuspid valve is abnormal. There is a 2.4 x 1.4 cm vegetation that appears to be attached to the tricuspid valve as well as vegetation attached to the ICD lead as it transits the tricuspid valve. The vegetation is not completely visualized on this study, but I think this looks more like endocarditis than a sterile thrombus in transit. Would followup with TEE.  6. The aortic valve is tricuspid. Aortic valve regurgitation is trivial.  7. Aortic dilatation noted. There is  mild dilatation of the aortic  root, measuring 37 mm.  8. The inferior vena cava is normal in size with greater than 50% respiratory variability, suggesting right atrial pressure of 3 mmHg. FINDINGS  Left Ventricle: Left ventricular ejection fraction, by estimation, is <20%. The left ventricle has severely decreased function. The left ventricle demonstrates global hypokinesis. The left ventricular internal cavity size was mildly dilated. There is mild left ventricular hypertrophy. Left ventricular diastolic parameters are indeterminate. Right Ventricle: The right ventricular size is mildly enlarged. No increase in right ventricular wall thickness. Right ventricular systolic function is moderately reduced. Tricuspid regurgitation signal is inadequate for assessing PA pressure. Left Atrium: Left atrial size was mildly dilated. Right Atrium: Right atrial size was normal in size. Pericardium: There is no evidence of pericardial effusion. Mitral Valve: The mitral valve is normal in structure. Trivial mitral valve regurgitation. No evidence of mitral valve stenosis. Tricuspid Valve: The tricuspid valve is abnormal. Tricuspid valve regurgitation is trivial. Aortic Valve: The aortic valve is tricuspid. Aortic valve regurgitation is trivial. Aortic valve mean gradient measures 2.0 mmHg. Aortic valve peak gradient measures 3.3 mmHg. Aortic valve area, by VTI measures 2.18 cm. Pulmonic Valve: The pulmonic valve was normal in structure. Pulmonic valve regurgitation is not visualized. Aorta: Aortic dilatation noted. There is mild dilatation of the aortic root, measuring 37 mm. Venous: The inferior vena cava is normal in size with greater than 50% respiratory variability, suggesting right atrial pressure of 3 mmHg. IAS/Shunts: No atrial level shunt detected by color flow Doppler. Additional Comments: A device lead is visualized in the right ventricle.  LEFT VENTRICLE PLAX 2D LVIDd:         6.09 cm LVIDs:         5.42 cm LV PW:         1.31 cm LV IVS:         1.31 cm LVOT diam:     2.40 cm LV SV:         27 LV SV Index:   13 LVOT Area:     4.52 cm  RIGHT VENTRICLE RV Basal diam:  4.60 cm LEFT ATRIUM             Index       RIGHT ATRIUM           Index LA diam:        2.50 cm 1.24 cm/m  RA Area:     15.60 cm LA Vol (A2C):   49.2 ml 24.39 ml/m RA Volume:   39.00 ml  19.36 ml/m LA Vol (A4C):   54.5 ml 27.05 ml/m LA Biplane Vol: 58.8 ml 29.18 ml/m  AORTIC VALVE AV Area (Vmax):    2.22 cm AV Area (Vmean):   2.45 cm AV Area (VTI):     2.18 cm AV Vmax:           90.50 cm/s AV Vmean:          60.600 cm/s AV VTI:            0.124 m AV Peak Grad:      3.3 mmHg AV Mean Grad:      2.0 mmHg LVOT Vmax:         44.40 cm/s LVOT Vmean:        32.800 cm/s LVOT VTI:          0.060 m LVOT/AV VTI ratio: 0.48  AORTA Ao Root diam: 3.70 cm Ao Asc diam:  3.60 cm MITRAL  VALVE MV Area (PHT): 6.17 cm     SHUNTS MV Decel Time: 123 msec     Systemic VTI:  0.06 m MV E velocity: 101.00 cm/s  Systemic Diam: 2.40 cm Loralie Champagne MD Electronically signed by Loralie Champagne MD Signature Date/Time: 02/22/2021/4:32:59 PM    Final    VAS [place] LOWER EXTREMITY VENOUS (DVT)  Result Date: 02/23/2021  Lower Venous DVT Study Patient Name:  Dennis [name].  Date of Exam:   02/23/2021 Medical Rec #: [id number]        Accession #:    [id number] Date of Birth: 01-10-1961         Patient Gender: M Patient Age:   59Y Exam Location:  [facility] Procedure:      VAS [place] LOWER EXTREMITY VENOUS (DVT) Referring Phys: [id number] Amal Renbarger CHIRAG Naturi Alarid --------------------------------------------------------------------------------  Indications: PE.  Risk Factors: Decreased mobility Cancer (Lung). Anticoagulation: Patient was put on Xarelto 12/2020 after blood clot but stopped after only 1 month (unclear why). Comparison Study: Previous exam 12/19/20 - Positive DVT LLE popliteal Performing Technologist: Rogelia Rohrer  Examination Guidelines: A complete evaluation includes B-mode imaging, spectral Doppler, color Doppler, and  power Doppler as needed of all accessible portions of each vessel. Bilateral testing is considered an integral part of a complete examination. Limited examinations for reoccurring indications may be performed as noted. The reflux portion of the exam is performed with the patient in reverse Trendelenburg.  +---------+---------------+---------+-----------+----------+--------------+ RIGHT    CompressibilityPhasicitySpontaneityPropertiesThrombus Aging +---------+---------------+---------+-----------+----------+--------------+ CFV      Full           Yes      Yes                                 +---------+---------------+---------+-----------+----------+--------------+ SFJ      Full                                                        +---------+---------------+---------+-----------+----------+--------------+ FV Prox  Full           Yes      Yes                                 +---------+---------------+---------+-----------+----------+--------------+ FV Mid   Full           Yes      Yes                                 +---------+---------------+---------+-----------+----------+--------------+ FV DistalFull           Yes      Yes                                 +---------+---------------+---------+-----------+----------+--------------+ PFV      Full                                                        +---------+---------------+---------+-----------+----------+--------------+  POP      Full           Yes      Yes                                 +---------+---------------+---------+-----------+----------+--------------+ PTV      Full                                                        +---------+---------------+---------+-----------+----------+--------------+ PERO     Full                                                        +---------+---------------+---------+-----------+----------+--------------+    +---------+---------------+---------+-----------+----------+------------------+ LEFT     CompressibilityPhasicitySpontaneityPropertiesThrombus Aging     +---------+---------------+---------+-----------+----------+------------------+ CFV      Partial        Yes      Yes                  Acute              +---------+---------------+---------+-----------+----------+------------------+ SFJ      Partial                                      Acute - mobile                                                           thrombus           +---------+---------------+---------+-----------+----------+------------------+ FV Prox  None           No       No                   Acute              +---------+---------------+---------+-----------+----------+------------------+ FV Mid   None           No       No                   Acute              +---------+---------------+---------+-----------+----------+------------------+ FV DistalNone           No       No                   Acute              +---------+---------------+---------+-----------+----------+------------------+ PFV      Full                                                            +---------+---------------+---------+-----------+----------+------------------+ POP  None           No       No                   Chronic            +---------+---------------+---------+-----------+----------+------------------+ PTV      None           No       No                   Acute              +---------+---------------+---------+-----------+----------+------------------+ PERO     Partial        No       No                   Acute              +---------+---------------+---------+-----------+----------+------------------+ Gastroc  None           No       No                   Acute              +---------+---------------+---------+-----------+----------+------------------+ SSV      Partial        No        No                   Chronic            +---------+---------------+---------+-----------+----------+------------------+ EIV      Full           Yes      Yes                  Not visualized     +---------+---------------+---------+-----------+----------+------------------+     Summary: BILATERAL: -No evidence of popliteal cyst, bilaterally. RIGHT: - There is no evidence of deep vein thrombosis in the lower extremity. - There is no evidence of superficial venous thrombosis.  LEFT: - Findings consistent with acute deep vein thrombosis involving the left common femoral vein, SF junction, left femoral vein, left posterior tibial veins, left peroneal veins, and left gastrocnemius veins. - Findings consistent with chronic deep vein thrombosis involving the left popliteal vein. - Findings consistent with chronic superficial vein thrombosis involving the left small saphenous vein. - Findings suggest new clot progression as compared to previous examination.  *See table(s) above for measurements and observations. Electronically signed by Servando Snare MD on 02/23/2021 at 11:05:28 AM.    Final         Scheduled Meds: . atorvastatin  20 mg Oral Daily  . Chlorhexidine Gluconate Cloth  6 each Topical Daily  . dexamethasone  1 mg Oral Daily  . enoxaparin (LOVENOX) injection  1 mg/kg Subcutaneous BID  . furosemide  20 mg Oral Daily  . insulin aspart  6 Units Subcutaneous TID AC  . insulin glargine  25 Units Subcutaneous BID  . midodrine  10 mg Oral TID WC  . mometasone-formoterol  2 puff Inhalation BID  . senna  1 tablet Oral BID  . sodium chloride flush  10-40 mL Intracatheter Q12H  . tamsulosin  0.4 mg Oral Daily  . umeclidinium bromide  1 puff Inhalation Daily   Continuous Infusions: . cefTRIAXone (ROCEPHIN)  IV 2 g (02/23/21 2230)  . vancomycin 750 mg (02/24/21 0600)     LOS: 2 days   Time  spent= 35 mins    Tawfiq Favila Arsenio Loader, MD [facility]  If 7PM-7AM, please contact  night-coverage  02/24/2021, 8:34 AM

## 2021-02-24 NOTE — Progress Notes (Signed)
No new recommendations from cardiology at this time.  Please refer to consult note yesterday.  I believe right heart mass is likely thrombus due to recent DVT.  Continue Lovenox.  Await blood cultures.  If blood cultures negative will discontinue antibiotics and treat with anticoagulation long-term. Kirk Ruths, MD

## 2021-02-24 NOTE — Progress Notes (Signed)
Initial Nutrition Assessment  RD working remotely.  DOCUMENTATION CODES:   Not applicable  INTERVENTION:  - will order Ensure Enlive BID, each supplement provides 350 kcal and 20 grams of protein. - will order 30 ml Prosource Plus BID, each supplement provides 100 kcal and 15 grams protein.  - complete NFPE when feasible.    NUTRITION DIAGNOSIS:   Increased nutrient needs related to acute illness as evidenced by estimated needs  GOAL:   Patient will meet greater than or equal to 90% of their needs  MONITOR:   PO intake,Supplement acceptance,Labs,Weight trends  REASON FOR ASSESSMENT:   Malnutrition Screening Tool  ASSESSMENT:   60 y.o. male with medical history of metastatic small cell lung cancer on chemo, CHF s/p AICD, cocaine use, HTN, recent CVA, HLD, type 2 DM, lower extremity DVT, and chronic hypoxia on 2L via [place]. He presented to the ED as directed by Oncologist for evaluation of pulmonary embolism. Echo showed tricuspid endocarditis extending into his ICD leads.  He consumed 75% of breakfast and 50% of lunch yesterday. Will order ONS as outlined above.   Weight today is 159 lb and weight on 01/22/21 was 178 lb. This indicates 19 lb weight loss (10.6% body weight) in the past 1 month; significant for time frame. No information documented in the edema section of flow sheet.   Per notes: - tricuspid endocarditis with infected ICD lead--Cardiology following - bilateral pulmonary embolism - metastatic small cell lung cancer on chemo - DNR/DNI with interest in home hospice at time of d/c   Labs reviewed; CBGs: 206 and 261 mg/dl, BUN: 24 mg/dl, Ca: 8.7 mg/dl. Medications reviewed; 20 mg oral lasix/day, sliding scale novolog, 6 units novolog TID, 25 units lantus BID, 40 mEq Klor-Con x2 doses 5/14, 1 tablet senokot BID.    NUTRITION - FOCUSED PHYSICAL EXAM:  unable to complete at this time.   Diet Order:   Diet Order            Diet Heart Room service appropriate?  Yes; Fluid consistency: Thin  Diet effective now                 EDUCATION NEEDS:   No education needs have been identified at this time  Skin:  Skin Assessment: Reviewed RN Assessment  Last BM:  5/15 (type 5 x1)  Height:   Ht Readings from Last 1 Encounters:  02/22/21 6' (1.829 m)    Weight:   Wt Readings from Last 1 Encounters:  02/24/21 72.2 kg    Estimated Nutritional Needs:  Kcal:  2165-2300 kcal Protein:  110-120 grams Fluid:  >/= 2.2 L/day      Jarome Matin, MS, RD, LDN, CNSC Inpatient Clinical Dietitian RD pager # available in [facility]  After hours/weekend pager # available in [facility]

## 2021-02-25 DIAGNOSIS — I38 Endocarditis, valve unspecified: Secondary | ICD-10-CM | POA: Diagnosis not present

## 2021-02-25 LAB — CBC
HCT: 30.1 % — ABNORMAL LOW (ref 39.0–52.0)
Hemoglobin: 9.1 g/dL — ABNORMAL LOW (ref 13.0–17.0)
MCH: 28.8 pg (ref 26.0–34.0)
MCHC: 30.2 g/dL (ref 30.0–36.0)
MCV: 95.3 fL (ref 80.0–100.0)
Platelets: 216 10*3/uL (ref 150–400)
RBC: 3.16 MIL/uL — ABNORMAL LOW (ref 4.22–5.81)
RDW: 17.7 % — ABNORMAL HIGH (ref 11.5–15.5)
WBC: 5.8 10*3/uL (ref 4.0–10.5)
nRBC: 0.3 % — ABNORMAL HIGH (ref 0.0–0.2)

## 2021-02-25 LAB — GLUCOSE, CAPILLARY
Glucose-Capillary: 209 mg/dL — ABNORMAL HIGH (ref 70–99)
Glucose-Capillary: 45 mg/dL — ABNORMAL LOW (ref 70–99)
Glucose-Capillary: 46 mg/dL — ABNORMAL LOW (ref 70–99)
Glucose-Capillary: 67 mg/dL — ABNORMAL LOW (ref 70–99)
Glucose-Capillary: 105 mg/dL — ABNORMAL HIGH (ref 70–99)
Glucose-Capillary: 202 mg/dL — ABNORMAL HIGH (ref 70–99)
Glucose-Capillary: 129 mg/dL — ABNORMAL HIGH (ref 70–99)

## 2021-02-25 LAB — BASIC METABOLIC PANEL
Anion gap: 8 (ref 5–15)
BUN: 22 mg/dL — ABNORMAL HIGH (ref 6–20)
CO2: 30 mmol/L (ref 22–32)
Calcium: 8.7 mg/dL — ABNORMAL LOW (ref 8.9–10.3)
Chloride: 98 mmol/L (ref 98–111)
Creatinine, Ser: 1.01 mg/dL (ref 0.61–1.24)
GFR, Estimated: >60 mL/min (ref >60)
Glucose, Bld: 174 mg/dL — ABNORMAL HIGH (ref 70–99)
Potassium: 3.7 mmol/L (ref 3.5–5.1)
Sodium: 136 mmol/L (ref 135–145)

## 2021-02-25 LAB — MAGNESIUM: Magnesium: 1.7 mg/dL (ref 1.7–2.4)

## 2021-02-25 LAB — HCV INTERPRETATION

## 2021-02-25 LAB — HCV AB W REFLEX TO QUANT PCR: HCV Ab: 0.1 s/co ratio (ref 0.0–0.9)

## 2021-02-25 MED ORDER — ENOXAPARIN SODIUM 80 MG/0.8ML IJ SOSY
70.0000 mg | PREFILLED_SYRINGE | Freq: Two times a day (BID) | INTRAMUSCULAR | Status: DC
  Administered 2021-02-25 – 2021-02-27 (×5): 70 mg via SUBCUTANEOUS
  Filled 2021-02-25 (×5): qty 0.8

## 2021-02-25 NOTE — TOC Initial Note (Signed)
Transition of Care (TOC) - Initial/Assessment Note    Patient Details  Name: Dennis [name]. MRN: [id number] Date of Birth: 1961/07/19  Transition of Care [facility]) CM/SW Contact:    Dessa Phi, RN Phone Number: 02/25/2021, 3:51 PM  Clinical Narrative: Referral for home w/hospice services-spoke to brother per patient permission [name]-agree to [facility] care for home w/hospice services-already has home 02.Family will transport on own-[facility] care rep Latanya Presser will assess, & eval for acceptance-informed her of lovenox injections @ d/c-she recc-MD write script for lovenox, & family take to pharmacy to fill prior to d/c-then they can provide the injections-less costly if done before acceptance to home hospice services.MD updated. Family has own transport home.                  Expected Discharge Plan: Home w Hospice Care Barriers to Discharge: Continued Medical Work up   Patient Goals and CMS Choice Patient states their goals for this hospitalization and ongoing recovery are:: go home w/hospice services CMS Medicare.gov Compare Post Acute Care list provided to:: Patient Represenative (must comment) (Brother [name] [phone]) Choice offered to / list presented to : Sibling  Expected Discharge Plan and Services Expected Discharge Plan: [place]   Discharge Planning Services: CM Consult   Living arrangements for the past 2 months: Single Family Home                                      Prior Living Arrangements/Services Living arrangements for the past 2 months: Single Family Home Lives with:: Siblings Patient language and need for interpreter reviewed:: Yes Do you feel safe going back to the place where you live?: Yes      Need for Family Participation in Patient Care: No (Comment) Care giver support system in place?: Yes (comment) Current home services: DME (home 02-[facility]) Criminal Activity/Legal Involvement Pertinent to Current Situation/Hospitalization: No  - Comment as needed  Activities of Daily Living Home Assistive Devices/Equipment: Hospital bed,Wheelchair,Walker (specify type),Bedside commode/3-in-1,Shower chair with back ADL Screening (condition at time of admission) Patient's cognitive ability adequate to safely complete daily activities?: Yes Is the patient deaf or have difficulty hearing?: No Does the patient have difficulty seeing, even when wearing glasses/contacts?: Yes (blurry vision) Does the patient have difficulty concentrating, remembering, or making decisions?: Yes Patient able to express need for assistance with ADLs?: Yes Does the patient have difficulty dressing or bathing?: Yes Independently performs ADLs?: No Communication: Independent Dressing (OT): Needs assistance Is this a change from baseline?: Pre-admission baseline Grooming: Independent Feeding: Independent Bathing: Needs assistance Is this a change from baseline?: Change from baseline, expected to last >3 days Toileting: Needs assistance Is this a change from baseline?: Pre-admission baseline In/Out Bed: Needs assistance Is this a change from baseline?: Pre-admission baseline Walks in Home: Needs assistance Is this a change from baseline?: Pre-admission baseline (recent broken left leg per pt) Does the patient have difficulty walking or climbing stairs?: Yes Weakness of Legs: Both (recent broken left leg) Weakness of Arms/Hands: Both  Permission Sought/Granted Permission sought to share information with : Case Manager Permission granted to share information with : Yes, Verbal Permission Granted  Share Information with NAME: Case Manager           Emotional Assessment Appearance:: Appears stated age Attitude/Demeanor/Rapport: Gracious Affect (typically observed): Accepting Orientation: : Oriented to Self,Oriented to Place,Oriented to  Time,Oriented to Situation Alcohol /  Substance Use: Not Applicable Psych Involvement: No (comment)  Admission  diagnosis:  Endocarditis [I38] Acute endocarditis, unspecified endocarditis type [I33.9] Multiple subsegmental pulmonary emboli without acute cor pulmonale ([place]) [I26.94] Patient Active Problem List   Diagnosis Date Noted  . Acute endocarditis   . 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  . Pulmonary embolism ([place]) 02/22/2021  . Port-A-Cath in place 02/05/2021  . Hypokalemia 01/16/2021  . Acute exacerbation of CHF (congestive heart failure) ([place]) 12/25/2020  . Femoral distal fracture ([place]) 10/30/2020  . Fall 10/30/2020  . COVID-19 virus infection 10/30/2020  . Brain metastases ([place]) 08/27/2020  . Orthostatic hypotension   . Syncope 07/09/2020  . Slurred speech 07/08/2020  . Uncontrolled type 2 diabetes mellitus with hyperglycemia, with long-term current use of insulin ([place]) 07/08/2020  . Unsteady gait when walking 07/07/2020  . Small cell lung cancer, right upper lobe ([name]) 01/19/2020  . Encounter for antineoplastic chemotherapy 01/19/2020  . Encounter for antineoplastic immunotherapy 01/19/2020  . Extensive stage primary small cell carcinoma of lung ([place]) 01/04/2020  . Goals of care, counseling/discussion 01/04/2020  . Mediastinal adenopathy   . Mass of upper lobe of right lung   . Brain mass 12/23/2019  . Vasogenic cerebral edema ([place]) 12/23/2019  . Left leg paresthesias   . History of CVA with residual deficit   . Acute on chronic systolic congestive heart failure ([place])   . Left-sided weakness   . 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 Cocaine abuse ([place]) 01/24/2015  . Essential hypertension 01/24/2015  . Dysphagia 01/24/2015  . Left hemiplegia ([place]) 01/24/2015  . Hyperlipidemia associated with type 2 diabetes mellitus ([place]) 11/30/2014  . Chronic systolic CHF (congestive heart failure) ([place]) 06/20/2014  . Automatic implantable cardioverter-defibrillator in situ   . Drug abuse and dependence ([place])   . Cardiomyopathy, ischemic 07/21/2011  . Type II diabetes mellitus with  ophthalmic manifestations ([place]) 09/13/2007   PCP:  [name], MD Pharmacy:   [facility], [place] [name] [place] [address] Phone: [phone] Fax: [phone]     Social Determinants of Health (SDOH) Interventions    Readmission Risk Interventions No flowsheet data found.

## 2021-02-25 NOTE — Progress Notes (Signed)
PROGRESS NOTE    Dennis [name].  FIE:[id number] DOB: 1961/09/28 DOA: 02/22/2021 PCP: [name], MD   Brief Narrative:  60 y.o. male with medical history significant of metastatic small cell lung cancer on chemotherapy, systolic CHF EF 16% status post AICD, history of cocaine use, HTN, recent CVA, HLD, DM2, lower extremity DVT used to be on Xarelto, chronic hypoxia on 2 L nasal cannula comes to the hospital as he was directed by his oncologist for evaluation of pulmonary embolism.  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  Currently monitoring blood cultures and on empiric IV antibiotics.  Infectious disease and cardiology team following.   Assessment & Plan:   Principal Problem:   [place] endocarditis Active Problems:   Type II diabetes mellitus with ophthalmic manifestations ([place])   Drug abuse and dependence ([place])   Cardiomyopathy, ischemic   Automatic implantable cardioverter-defibrillator in situ   Chronic systolic CHF (congestive heart failure) ([place])   Hyperlipidemia associated with type 2 diabetes mellitus ([place])   Essential hypertension   Small cell lung cancer, right upper lobe ([place])   Pulmonary embolism (H[facility])   Acute endocarditis    Tricuspid endocarditis with infected ICD lead - Empiric- vancomycin and Rocephin.  Low threshold to discontinue in next 24 hrs if Cx remains neg.  - Vegetation is suspicious of thrombus versus infection -  Does have a Chemo-Port in place which could be source -Blood cultures- NGTD -Cardiology and ID following  Bilateral pulmonary embolism without cor pulmonale History of DVT -  Lovenox 1 mg/kg every 12 hours, have asked CM to look into getting patient this at home. If not, then he will need to be on something PO.  - Lower extremity Dopplers- extensive left lower extremity DVT -Had been off his Xarelto for 6 weeks  Metastatic small cell lung cancer was on outpatient  Chemo - Follows outpatient [name], he was updated by me that we will be transitioning him to home with hospice.   Daily dexamethasone.  Urinary tract infection without hematuria - Urine cultures-pending - Already on antibiotics  Chronic congestive heart failure reduced ejection fraction, EF 15% status post ICD - Not on any home medications because of hypotension.  Continue midodrine -Supportive care.  Lasix 20 mg daily  CKD stage IIIa, stable - Chronic.  Creatinine at baseline of 1.3.   Diabetes mellitus type 2 - Continue Lantus 25 units twice daily.  Insulin sliding scale and Accu-Chek - Stop premeal insulin due to low BGs  Chronic hypoxia on 2 L nasal cannula History of COPD/emphysema - Bronchodilators  Hyperlipidemia - Lipitor  Goals of care discussion- I had extensive discussion with the patient and his brother at bedside.  He is interested in home hospice therefore TOC consulted.  DNR/DNI   DVT prophylaxis: Lovenox 1 mg/kg Code Status: Full code Family Communication:   Status is: Inpatient  Remains inpatient appropriate because:Hemodynamically unstable   Dispo: The patient is from: Home              Anticipated d/c is to: Home with hospice              Patient currently is not medically stable to d/c.  Ongoing evaluation for endocarditis and treatment for pulmonary embolism.  Unsafe for discharge.  TOC consulted as patient is interested in home with hospice upon discharge   Difficult to place patient No     Subjective: Doing ok, no complaints.   Review  of Systems Otherwise negative except as per HPI, including: General = no fevers, chills, dizziness,  fatigue HEENT/EYES = negative for loss of vision, double vision, blurred vision,  sore throa Cardiovascular= negative for chest pain, palpitation Respiratory/lungs= negative for shortness of breath, cough, wheezing; hemoptysis,  Gastrointestinal= negative for nausea, vomiting, abdominal  pain Genitourinary= negative for Dysuria MSK = Negative for arthralgia, myalgias Neurology= Negative for headache, numbness, tingling  Psychiatry= Negative for suicidal and homocidal ideation Skin= Negative for Rash   Examination: Constitutional: Not in acute distress Respiratory: Clear to auscultation bilaterally Cardiovascular: Normal sinus rhythm, no rubs Abdomen: Nontender nondistended good bowel sounds Musculoskeletal: No edema noted Skin: No rashes seen Neurologic: CN 2-12 grossly intact.  And nonfocal Psychiatric: Normal judgment and insight. Alert and oriented x 3. Normal mood.   Right chest wall Chemo-Port in place  Objective: Vitals:   02/24/21 2029 02/24/21 2036 02/25/21 0527 02/25/21 1148  BP: 101/79  109/82 114/83  Pulse: 88  (!) 103 (!) 109  Resp: 20  18 16   Temp: 98.3 F (36.8 C)  97.6 F (36.4 C) (!) 97.5 F (36.4 C)  TempSrc: Oral  Oral Oral  SpO2: 100% 99% 95% 96%  Weight:      Height:        Intake/Output Summary (Last 24 hours) at 02/25/2021 1318 Last data filed at 02/25/2021 1145 Gross per 24 hour  Intake 1309.45 ml  Output 860 ml  Net 449.45 ml   Filed Weights   02/22/21 1406 02/23/21 0533 02/24/21 0558  Weight: 79.6 kg 72.8 kg 72.2 kg     Data Reviewed:   CBC: Recent Labs  Lab 02/21/21 1452 02/22/21 1442 02/23/21 0500 02/24/21 0320 02/25/21 0320  WBC 10.4 7.5 5.3 5.9 5.8  NEUTROABS 7.6  --   --   --   --   HGB 11.5* 10.9* 9.6* 9.7* 9.1*  HCT 36.2* 34.5* 30.9* 32.0* 30.1*  MCV 91.2 92.2 93.1 95.5 95.3  PLT 84* 128* 125* 160 580   Basic Metabolic Panel: Recent Labs  Lab 02/21/21 1452 02/22/21 1442 02/23/21 0500 02/24/21 0320 02/25/21 0320  NA 137 134* 133* 138 136  K 3.8 3.6 3.1* 3.8 3.7  CL 93* 93* 94* 99 98  CO2 32 34* 29 29 30   GLUCOSE 239* 337* 286* 268* 174*  BUN 29* 32* 30* 24* 22*  CREATININE 1.52* 1.39* 1.41* 1.05 1.01  CALCIUM 9.5 9.0 8.4* 8.7* 8.7*  MG  --   --  1.7 1.9 1.7   GFR: Estimated Creatinine  Clearance: 81.4 mL/min (by C-G formula based on SCr of 1.01 mg/dL). Liver Function Tests: Recent Labs  Lab 02/21/21 1452  AST 21  ALT 19  ALKPHOS 190*  BILITOT 0.5  PROT 6.7  ALBUMIN 3.2*   No results for input(s): LIPASE, AMYLASE in the last 168 hours. No results for input(s): AMMONIA in the last 168 hours. Coagulation Profile: Recent Labs  Lab 02/22/21 1442  INR 1.1   Cardiac Enzymes: No results for input(s): CKTOTAL, CKMB, CKMBINDEX, TROPONINI in the last 168 hours. BNP (last 3 results) No results for input(s): PROBNP in the last 8760 hours. HbA1C: No results for input(s): HGBA1C in the last 72 hours. CBG: Recent Labs  Lab 02/25/21 0726 02/25/21 1139 02/25/21 1145 02/25/21 1211 02/25/21 1242  GLUCAP 209* 45* 46* 67* 105*   Lipid Profile: No results for input(s): CHOL, HDL, LDLCALC, TRIG, CHOLHDL, LDLDIRECT in the last 72 hours. Thyroid Function Tests: No results for input(s): TSH, T4TOTAL,  FREET4, T3FREE, THYROIDAB in the last 72 hours. Anemia Panel: No results for input(s): VITAMINB12, FOLATE, FERRITIN, TIBC, IRON, RETICCTPCT in the last 72 hours. Sepsis Labs: No results for input(s): PROCALCITON, LATICACIDVEN in the last 168 hours.  Recent Results (from the past 240 hour(s))  Resp Panel by RT-PCR (Flu A&B, Covid) Nasopharyngeal Swab     Status: None   Collection Time: 02/22/21  2:42 PM   Specimen: Nasopharyngeal Swab; Nasopharyngeal(NP) swabs in vial transport medium  Result Value Ref Range Status   SARS Coronavirus 2 by RT PCR NEGATIVE NEGATIVE Final    Comment: (NOTE) SARS-CoV-2 target nucleic acids are NOT DETECTED.  The SARS-CoV-2 RNA is generally detectable in upper respiratory specimens during the acute phase of infection. The lowest concentration of SARS-CoV-2 viral copies this assay can detect is 138 copies/mL. A negative result does not preclude SARS-Cov-2 infection and should not be used as the sole basis for treatment or other patient  management decisions. A negative result may occur with  improper specimen collection/handling, submission of specimen other than nasopharyngeal swab, presence of viral mutation(s) within the areas targeted by this assay, and inadequate number of viral copies(<138 copies/mL). A negative result must be combined with clinical observations, patient history, and epidemiological information. The expected result is Negative.  Fact Sheet for Patients:  EntrepreneurPulse.com.au  Fact Sheet for Healthcare Providers:  IncredibleEmployment.be  This test is no t yet approved or cleared by the [place] FDA and  has been authorized for detection and/or diagnosis of SARS-CoV-2 by FDA under an Emergency Use Authorization (EUA). This EUA will remain  in effect (meaning this test can be used) for the duration of the COVID-19 declaration under Section 564(b)(1) of the Act, 21 U.S.C.section 360bbb-3(b)(1), unless the authorization is terminated  or revoked sooner.       Influenza A by PCR NEGATIVE NEGATIVE Final   Influenza B by PCR NEGATIVE NEGATIVE Final    Comment: (NOTE) The Xpert Xpress SARS-CoV-2/FLU/RSV plus assay is intended as an aid in the diagnosis of influenza from Nasopharyngeal swab specimens and should not be used as a sole basis for treatment. Nasal washings and aspirates are unacceptable for Xpert Xpress SARS-CoV-2/FLU/RSV testing.  Fact Sheet for Patients: EntrepreneurPulse.com.au  Fact Sheet for Healthcare Providers: IncredibleEmployment.be  This test is not yet approved or cleared by the [place] FDA and has been authorized for detection and/or diagnosis of SARS-CoV-2 by FDA under an Emergency Use Authorization (EUA). This EUA will remain in effect (meaning this test can be used) for the duration of the COVID-19 declaration under Section 564(b)(1) of the Act, 21 U.S.C. section 360bbb-3(b)(1),  unless the authorization is terminated or revoked.  Performed at [facility], [place] [address] [place], [place] [address]   Urine culture     Status: Abnormal   Collection Time: 02/22/21  4:28 PM   Specimen: Urine, Random  Result Value Ref Range Status   Specimen Description   Final    URINE, RANDOM Performed at [facility] [address] [place], [place] [address]    Special Requests   Final    NONE Performed at [facility], [place] [address] [place], [place] [address]    Culture >=100,000 COLONIES/mL ESCHERICHIA COLI (A)  Final   Report Status 02/24/2021 FINAL  Final   Organism ID, Bacteria ESCHERICHIA COLI (A)  Final      Susceptibility   Escherichia coli - MIC*    AMPICILLIN >=32 RESISTANT Resistant  CEFAZOLIN 16 SENSITIVE Sensitive     CEFEPIME <=0.12 SENSITIVE Sensitive     CEFTRIAXONE <=0.25 SENSITIVE Sensitive     CIPROFLOXACIN <=0.25 SENSITIVE Sensitive     GENTAMICIN <=1 SENSITIVE Sensitive     IMIPENEM 0.5 SENSITIVE Sensitive     NITROFURANTOIN <=16 SENSITIVE Sensitive     TRIMETH/SULFA <=20 SENSITIVE Sensitive     AMPICILLIN/SULBACTAM >=32 RESISTANT Resistant     PIP/TAZO <=4 SENSITIVE Sensitive     * >=100,000 COLONIES/mL ESCHERICHIA COLI  Culture, blood (routine x 2)     Status: None (Preliminary result)   Collection Time: 02/22/21  4:49 PM   Specimen: BLOOD  Result Value Ref Range Status   Specimen Description   Final    BLOOD CHEST UPPER Performed at [facility] [address] [place], [place] [address]    Special Requests   Final    BOTTLES DRAWN AEROBIC AND ANAEROBIC Blood Culture adequate volume Performed at [facility] [address], [place], [place] [address]    Culture   Final    NO GROWTH 2 DAYS Performed at [facility] [address], [place], [place] [address]    Report Status PENDING  Incomplete  Culture, blood (routine x 2)      Status: None (Preliminary result)   Collection Time: 02/22/21  4:49 PM   Specimen: BLOOD  Result Value Ref Range Status   Specimen Description   Final    BLOOD CHEST UPPER Performed at [facility] [address] [place], [place] [address]    Special Requests   Final    BOTTLES DRAWN AEROBIC AND ANAEROBIC Blood Culture adequate volume Performed at [facility] [address] [place], [place] [address]    Culture   Final    NO GROWTH 2 DAYS Performed at [facility] [address], [place], [place] [address]    Report Status PENDING  Incomplete         Radiology Studies: No results found.      Scheduled Meds: . atorvastatin  20 mg Oral Daily  . Chlorhexidine Gluconate Cloth  6 each Topical Daily  . dexamethasone  1 mg Oral Daily  . enoxaparin (LOVENOX) injection  70 mg Subcutaneous BID  . furosemide  20 mg Oral Daily  . insulin aspart  0-5 Units Subcutaneous QHS  . insulin aspart  0-9 Units Subcutaneous TID WC  . insulin aspart  6 Units Subcutaneous TID AC  . insulin glargine  25 Units Subcutaneous BID  . midodrine  10 mg Oral TID WC  . mometasone-formoterol  2 puff Inhalation BID  . senna  1 tablet Oral BID  . sodium chloride flush  10-40 mL Intracatheter Q12H  . tamsulosin  0.4 mg Oral Daily  . umeclidinium bromide  1 puff Inhalation Daily   Continuous Infusions: . cefTRIAXone (ROCEPHIN)  IV 2 g (02/24/21 2228)  . vancomycin 750 mg (02/25/21 0537)     LOS: 3 days   Time spent= 35 mins    Maynard David Arsenio Loader, MD [facility]  If 7PM-7AM, please contact night-coverage  02/25/2021, 1:18 PM

## 2021-02-25 NOTE — Significant Event (Signed)
Hypoglycemic Event  CBG: 46 Treatment: 8 oz juice  Symptoms: Asymptomatic  Follow-up CBG: Time:1211 CBG Result:67  Treatment: 8 oz juice  Symptoms: Asymptomatic  Follow-up CBG: DKCC:6190 CBG Result:105   Possible Reasons for Event: Unknown  Comments/MD notified: [place]

## 2021-02-25 NOTE — Care Management Important Message (Signed)
Important Message  Patient Details IM Letter given to the Patient. Name: Dennis [name]. MRN: [id number] Date of Birth: 1961-07-10   Medicare Important Message Given:  Yes     Kerin Salen 02/25/2021, 11:21 AM

## 2021-02-25 NOTE — Progress Notes (Signed)
[facility] for Infectious Disease   Reason for visit: Follow up on endocardial mass  Interval History: blood cultures remain negative to date; no fever, WBC wnl.     Physical Exam: Constitutional:  Vitals:   02/25/21 0527 02/25/21 1148  BP: 109/82 114/83  Pulse: (!) 103 (!) 109  Resp: 18 16  Temp: 97.6 F (36.4 C) (!) 97.5 F (36.4 C)  SpO2: 95% 96%   patient appears in NAD Respiratory: Normal respiratory effort; CTA B Cardiovascular: RRR GI: soft, nt, nd  Review of Systems: Constitutional: negative for fevers and chills Gastrointestinal: negative for nausea and diarrhea Integument/breast: negative for rash  Lab Results  Component Value Date   WBC 5.8 02/25/2021   HGB 9.1 (L) 02/25/2021   HCT 30.1 (L) 02/25/2021   MCV 95.3 02/25/2021   PLT 216 02/25/2021    Lab Results  Component Value Date   CREATININE 1.01 02/25/2021   BUN 22 (H) 02/25/2021   NA 136 02/25/2021   K 3.7 02/25/2021   CL 98 02/25/2021   CO2 30 02/25/2021    Lab Results  Component Value Date   ALT 19 02/21/2021   AST 21 02/21/2021   ALKPHOS 190 (H) 02/21/2021     Microbiology: Recent Results (from the past 240 hour(s))  Resp Panel by RT-PCR (Flu A&B, Covid) Nasopharyngeal Swab     Status: None   Collection Time: 02/22/21  2:42 PM   Specimen: Nasopharyngeal Swab; Nasopharyngeal(NP) swabs in vial transport medium  Result Value Ref Range Status   SARS Coronavirus 2 by RT PCR NEGATIVE NEGATIVE Final    Comment: (NOTE) SARS-CoV-2 target nucleic acids are NOT DETECTED.  The SARS-CoV-2 RNA is generally detectable in upper respiratory specimens during the acute phase of infection. The lowest concentration of SARS-CoV-2 viral copies this assay can detect is 138 copies/mL. A negative result does not preclude SARS-Cov-2 infection and should not be used as the sole basis for treatment or other patient management decisions. A negative result may occur with  improper specimen  collection/handling, submission of specimen other than nasopharyngeal swab, presence of viral mutation(s) within the areas targeted by this assay, and inadequate number of viral copies(<138 copies/mL). A negative result must be combined with clinical observations, patient history, and epidemiological information. The expected result is Negative.  Fact Sheet for Patients:  EntrepreneurPulse.com.au  Fact Sheet for Healthcare Providers:  IncredibleEmployment.be  This test is no t yet approved or cleared by the [place] FDA and  has been authorized for detection and/or diagnosis of SARS-CoV-2 by FDA under an Emergency Use Authorization (EUA). This EUA will remain  in effect (meaning this test can be used) for the duration of the COVID-19 declaration under Section 564(b)(1) of the Act, 21 U.S.C.section 360bbb-3(b)(1), unless the authorization is terminated  or revoked sooner.       Influenza A by PCR NEGATIVE NEGATIVE Final   Influenza B by PCR NEGATIVE NEGATIVE Final    Comment: (NOTE) The Xpert Xpress SARS-CoV-2/FLU/RSV plus assay is intended as an aid in the diagnosis of influenza from Nasopharyngeal swab specimens and should not be used as a sole basis for treatment. Nasal washings and aspirates are unacceptable for Xpert Xpress SARS-CoV-2/FLU/RSV testing.  Fact Sheet for Patients: EntrepreneurPulse.com.au  Fact Sheet for Healthcare Providers: IncredibleEmployment.be  This test is not yet approved or cleared by the [place] FDA and has been authorized for detection and/or diagnosis of SARS-CoV-2 by FDA under an Emergency Use Authorization (EUA). This EUA  will remain in effect (meaning this test can be used) for the duration of the COVID-19 declaration under Section 564(b)(1) of the Act, 21 U.S.C. section 360bbb-3(b)(1), unless the authorization is terminated or revoked.  Performed at [facility], [place] [address] [place], [place] [address]   Urine culture     Status: Abnormal   Collection Time: 02/22/21  4:28 PM   Specimen: Urine, Random  Result Value Ref Range Status   Specimen Description   Final    URINE, RANDOM Performed at [facility] [address], [place], [place] [address]    Special Requests   Final    NONE Performed at [facility], [place] [address], [place], [place] [address]    Culture >=100,000 COLONIES/mL ESCHERICHIA COLI (A)  Final   Report Status 02/24/2021 FINAL  Final   Organism ID, Bacteria ESCHERICHIA COLI (A)  Final      Susceptibility   Escherichia coli - MIC*    AMPICILLIN >=32 RESISTANT Resistant     CEFAZOLIN 16 SENSITIVE Sensitive     CEFEPIME <=0.12 SENSITIVE Sensitive     CEFTRIAXONE <=0.25 SENSITIVE Sensitive     CIPROFLOXACIN <=0.25 SENSITIVE Sensitive     GENTAMICIN <=1 SENSITIVE Sensitive     IMIPENEM 0.5 SENSITIVE Sensitive     NITROFURANTOIN <=16 SENSITIVE Sensitive     TRIMETH/SULFA <=20 SENSITIVE Sensitive     AMPICILLIN/SULBACTAM >=32 RESISTANT Resistant     PIP/TAZO <=4 SENSITIVE Sensitive     * >=100,000 COLONIES/mL ESCHERICHIA COLI  Culture, blood (routine x 2)     Status: None (Preliminary result)   Collection Time: 02/22/21  4:49 PM   Specimen: BLOOD  Result Value Ref Range Status   Specimen Description   Final    BLOOD CHEST UPPER Performed at [facility] [address] [place], [place] [address]    Special Requests   Final    BOTTLES DRAWN AEROBIC AND ANAEROBIC Blood Culture adequate volume Performed at [facility] [address] [place], [place] [address]    Culture   Final    NO GROWTH 2 DAYS Performed at [facility] [address], [place], [place] [address]    Report Status PENDING  Incomplete  Culture, blood (routine x 2)     Status: None (Preliminary result)   Collection Time: 02/22/21  4:49 PM    Specimen: BLOOD  Result Value Ref Range Status   Specimen Description   Final    BLOOD CHEST UPPER Performed at [facility] [address] [place], [place] [address]    Special Requests   Final    BOTTLES DRAWN AEROBIC AND ANAEROBIC Blood Culture adequate volume Performed at [facility] [address], [place], [place] [address]    Culture   Final    NO GROWTH 2 DAYS Performed at [facility] [address] [place], [place] [address]    Report Status PENDING  Incomplete    Impression/Plan:  1. TV and ICD vegetation - blood cultures negative to date with no acute signs of infection including a normal WBC, no fever, most c/w a clot in the setting of cancer.   On vancomycin and ceftriaxone and will stop antibiotics tomorrow if blood cultures remain negative.   Anticoagulation per cardiology.   2. Screening - HCV pending and HIV negative in March.

## 2021-02-25 NOTE — Plan of Care (Signed)

## 2021-02-25 NOTE — Progress Notes (Signed)
Cardiology Progress Note  Patient ID: Dennis [name]. MRN: [id number] DOB: 03-Jul-1961 Date of Encounter: 02/25/2021  Primary Cardiologist: Glori Bickers, MD  Subjective   Chief Complaint: None.  HPI: Denies chest pain.  No shortness of breath.  Euvolemic on examination.  ROS:  All other ROS reviewed and negative. Pertinent positives noted in the HPI.     Inpatient Medications  Scheduled Meds: . atorvastatin  20 mg Oral Daily  . Chlorhexidine Gluconate Cloth  6 each Topical Daily  . dexamethasone  1 mg Oral Daily  . enoxaparin (LOVENOX) injection  70 mg Subcutaneous BID  . furosemide  20 mg Oral Daily  . insulin aspart  0-5 Units Subcutaneous QHS  . insulin aspart  0-9 Units Subcutaneous TID WC  . insulin aspart  6 Units Subcutaneous TID AC  . insulin glargine  25 Units Subcutaneous BID  . midodrine  10 mg Oral TID WC  . mometasone-formoterol  2 puff Inhalation BID  . senna  1 tablet Oral BID  . sodium chloride flush  10-40 mL Intracatheter Q12H  . tamsulosin  0.4 mg Oral Daily  . umeclidinium bromide  1 puff Inhalation Daily   Continuous Infusions: . cefTRIAXone (ROCEPHIN)  IV 2 g (02/24/21 2228)  . vancomycin 750 mg (02/25/21 0537)   PRN Meds: acetaminophen **OR** acetaminophen, bisacodyl, guaiFENesin, hydrALAZINE, HYDROcodone-acetaminophen, ipratropium-albuterol, methocarbamol, ondansetron **OR** ondansetron (ZOFRAN) IV, prochlorperazine, senna-docusate, sodium chloride, sodium chloride flush   Vital Signs   Vitals:   02/24/21 2029 02/24/21 2036 02/25/21 0527 02/25/21 1148  BP: 101/79  109/82 114/83  Pulse: 88  (!) 103 (!) 109  Resp: 20  18 16   Temp: 98.3 F (36.8 C)  97.6 F (36.4 C) (!) 97.5 F (36.4 C)  TempSrc: Oral  Oral Oral  SpO2: 100% 99% 95% 96%  Weight:      Height:        Intake/Output Summary (Last 24 hours) at 02/25/2021 1243 Last data filed at 02/25/2021 0816 Gross per 24 hour  Intake 949.45 ml  Output 860 ml  Net 89.45 ml   Last 3  Weights 02/24/2021 02/23/2021 02/22/2021  Weight (lbs) 159 lb 2.8 oz 160 lb 7.9 oz 175 lb 6.4 oz  Weight (kg) 72.2 kg 72.8 kg 79.561 kg  Some encounter information is confidential and restricted. Go to Review Flowsheets activity to see all data.      Telemetry  Overnight telemetry shows sinus rhythm heart rate in the 90s to low 100s, which I personally reviewed.   Physical Exam   Vitals:   02/24/21 2029 02/24/21 2036 02/25/21 0527 02/25/21 1148  BP: 101/79  109/82 114/83  Pulse: 88  (!) 103 (!) 109  Resp: 20  18 16   Temp: 98.3 F (36.8 C)  97.6 F (36.4 C) (!) 97.5 F (36.4 C)  TempSrc: Oral  Oral Oral  SpO2: 100% 99% 95% 96%  Weight:      Height:         Intake/Output Summary (Last 24 hours) at 02/25/2021 1243 Last data filed at 02/25/2021 0816 Gross per 24 hour  Intake 949.45 ml  Output 860 ml  Net 89.45 ml    Last 3 Weights 02/24/2021 02/23/2021 02/22/2021  Weight (lbs) 159 lb 2.8 oz 160 lb 7.9 oz 175 lb 6.4 oz  Weight (kg) 72.2 kg 72.8 kg 79.561 kg  Some encounter information is confidential and restricted. Go to Review Flowsheets activity to see all data.    Body mass index is 21.59 kg/m.  General: Well nourished, well developed, in no acute distress Head: Atraumatic, normal size  Eyes: PEERLA, EOMI  Neck: Supple, no JVD Endocrine: No thryomegaly Cardiac: Normal S1, S2; RRR; 2 out of 6 holosystolic murmur Lungs: Clear to auscultation bilaterally, no wheezing, rhonchi or rales  Abd: Soft, nontender, no hepatomegaly  Ext: No edema, pulses 2+ Musculoskeletal: No deformities, BUE and BLE strength normal and equal Skin: Warm and dry, no rashes   Neuro: Alert and oriented to person, place, time, and situation, CNII-XII grossly intact, no focal deficits  Psych: Normal mood and affect   Labs  High Sensitivity Troponin:   Recent Labs  Lab 02/22/21 1442 02/22/21 1632  TROPONINIHS 23* 23*     Cardiac EnzymesNo results for input(s): TROPONINI in the last 168 hours. No  results for input(s): TROPIPOC in the last 168 hours.  Chemistry Recent Labs  Lab 02/21/21 1452 02/22/21 1442 02/23/21 0500 02/24/21 0320 02/25/21 0320  NA 137   < > 133* 138 136  K 3.8   < > 3.1* 3.8 3.7  CL 93*   < > 94* 99 98  CO2 32   < > 29 29 30   GLUCOSE 239*   < > 286* 268* 174*  BUN 29*   < > 30* 24* 22*  CREATININE 1.52*   < > 1.41* 1.05 1.01  CALCIUM 9.5   < > 8.4* 8.7* 8.7*  PROT 6.7  --   --   --   --   ALBUMIN 3.2*  --   --   --   --   AST 21  --   --   --   --   ALT 19  --   --   --   --   ALKPHOS 190*  --   --   --   --   BILITOT 0.5  --   --   --   --   GFRNONAA 53*   < > 58* >60 >60  ANIONGAP 12   < > 10 10 8    < > = values in this interval not displayed.    Hematology Recent Labs  Lab 02/23/21 0500 02/24/21 0320 02/25/21 0320  WBC 5.3 5.9 5.8  RBC 3.32* 3.35* 3.16*  HGB 9.6* 9.7* 9.1*  HCT 30.9* 32.0* 30.1*  MCV 93.1 95.5 95.3  MCH 28.9 29.0 28.8  MCHC 31.1 30.3 30.2  RDW 17.8* 17.7* 17.7*  PLT 125* 160 216   BNP Recent Labs  Lab 02/22/21 1442  BNP 408.7*    DDimer No results for input(s): DDIMER in the last 168 hours.   Radiology  No results found.  Cardiac Studies  TTE 02/22/2021  1. Left ventricular ejection fraction, by estimation, is <20%. The left  ventricle has severely decreased function. The left ventricle demonstrates  global hypokinesis. The left ventricular internal cavity size was mildly  dilated. There is mild left  ventricular hypertrophy. Left ventricular diastolic parameters are  indeterminate.  2. Right ventricular systolic function is moderately reduced. The right  ventricular size is mildly enlarged. Tricuspid regurgitation signal is  inadequate for assessing PA pressure.  3. Left atrial size was mildly dilated.  4. The mitral valve is normal in structure. Trivial mitral valve  regurgitation. No evidence of mitral stenosis.  5. The tricuspid valve is abnormal. There is a 2.4 x 1.4 cm vegetation  that  appears to be attached to the tricuspid valve as well as vegetation  attached to the ICD lead as  it transits the tricuspid valve. The  vegetation is not completely visualized on  this study, but I think this looks more like endocarditis than a sterile  thrombus in transit. Would followup with TEE.  6. The aortic valve is tricuspid. Aortic valve regurgitation is trivial.  7. Aortic dilatation noted. There is mild dilatation of the aortic root,  measuring 37 mm.  8. The inferior vena cava is normal in size with greater than 50%  respiratory variability, suggesting right atrial pressure of 3 mmHg.   Patient Profile  Dennis [name]. is a 60 y.o. male with end-stage cardiomyopathy, metastatic small cell lung cancer, DVT, diabetes, history of stroke who was admitted on 02/23/2021 for new onset pulmonary emboli.  Cardiology consulted for right atrial mass.  Assessment & Plan   1.  Nonbacterial thrombotic endocarditis -I have reviewed his echocardiogram.  He has a mass in the right atrium.  Unclear if this is attached to the tricuspid valve or in the right atrium.  He is also been diagnosed with multiple pulm emboli as well as DVT.  This could represent a mass in transit.  Regardless he has advanced cancer as well as an end-stage cardiomyopathy.  His blood cultures are negative.  There is no evidence of destruction of the valves.  There is evidence of thrombus on the leads of the pacemaker as well. -Given his blood cultures are negative and he has advanced malignancy this could represent an nonbacterial thrombotic endocarditis.  Given that he is not septic or infected I see no need to put him through a transesophageal echocardiogram.  We may have difficulty doing this given his end-stage cardiomyopathy.  He also has advanced malignancy with limited options. -I think we should pursue anticoagulation with Lovenox and then repeat an echocardiogram in 3 to 6 months.  We could look for resolution of the  lesions. -This will also treat his pulmonary emboli.  If he is transition to an oral agent that should be warfarin.  There is an association of recurrence of in NBTE with DOAC's.  It is reassuring that he does not have any obstructive lesions as this can be seen in certain cases.  2.  End-stage cardiomyopathy -Mixed ischemic and nonischemic picture.  ICD has reached end-of-life and he is not a candidate for replacement.  He is not tolerating guideline directed medical therapy.  This is well-documented in the advanced heart failure clinic. -We will continue his home midodrine. -Would also continue his home diuretics. -No indications for aggressive cardiovascular care at this point.  [facility] will sign off.   Medication Recommendations: Lovenox as above.  Transition to Coumadin if we pursue an oral agent. Other recommendations (labs, testing, etc): None. Follow up as an outpatient: He may keep his follow-up with Dr. Glori Bickers.  We will arrange hospital follow-up in 4 to 6 weeks.  For questions or updates, please contact [place] Please consult [email or web address] for contact info under   Time Spent with Patient: I have spent a total of 25 minutes with patient reviewing hospital notes, telemetry, EKGs, labs and examining the patient as well as establishing an assessment and plan that was discussed with the patient.  > 50% of time was spent in direct patient care.    Signed, Addison Naegeli. Audie Box, MD, [place]  02/25/2021 12:43 PM

## 2021-02-25 NOTE — Progress Notes (Signed)
[facility])  Referral received for hospice in the home, once discharged.  Will Bonnet, [facility] and brother, to discuss hospice. [name] understands that there will not be any further chemotherapy or aggressive treatments.   Discussed that his AICD should be turned off prior to leaving the hospital as it would cause discomfort once Mr. Febus starts to transition to EOL. [name] voiced understanding.   [name] requested that we meet with Mr. Tuohey at the bedside to discuss further on 5/17 @ 1230.  Currently has O2 with [employer] store.   [facility] will meet with family tomorrow to continue dc planning.   Venia Carbon RN, BSN, [facility] Liaison

## 2021-02-25 NOTE — Progress Notes (Signed)
Pharmacy Antibiotic Note  Dennis Chon Buhl. is a 60 y.o. male with PMH polysubstance abuse and metastatic lung CA admitted on 02/22/2021 for incidental finding of PE on outpatient CT. Echo done in ED shows possible vegetations on tricuspid valve and device lead.  Pharmacy has been consulted for vancomycin and cefepime dosing for endocarditis.   Day 4 Abxs - vanc/rocephin Afebrile WBC WNL stable SCr stable BCx still no growth to date  Plan:  Continue vanc 750 mg IV q12 hr for now (est AUC 455 based on SCr 1.39; Vd 0.72)  Waiting for plan per ID before consider checking vanc pk/tr  SCr q48 while on vanc  Continue cefepime 2 g IV q8 hr per current renal function  Height: 6' (182.9 cm) Weight: 72.2 kg (159 lb 2.8 oz) IBW/kg (Calculated) : 77.6  Temp (24hrs), Avg:98 F (36.7 C), Min:97.6 F (36.4 C), Max:98.3 F (36.8 C)  Recent Labs  Lab 02/21/21 1452 02/22/21 1442 02/23/21 0500 02/24/21 0320 02/25/21 0320  WBC 10.4 7.5 5.3 5.9 5.8  CREATININE 1.52* 1.39* 1.41* 1.05 1.01    Estimated Creatinine Clearance: 81.4 mL/min (by C-G formula based on SCr of 1.01 mg/dL).    No Known Allergies  Antimicrobials this admission: 5/13 vancomycin >>  5/13 cefepime >>   Dose adjustments this admission: n/a  Microbiology results: 5/13 BCx: ngtd 5/13 UCx: Ecoli - resistant to amp and unasyn only  Thank you for allowing pharmacy to be a part of this patient's care.  Adrian Saran, PharmD, BCPS Secure Chat if ?s 02/25/2021 7:48 AM

## 2021-02-26 ENCOUNTER — Inpatient Hospital Stay: Payer: Medicare Other | Admitting: Nutrition

## 2021-02-26 ENCOUNTER — Inpatient Hospital Stay: Payer: Medicare Other

## 2021-02-26 ENCOUNTER — Inpatient Hospital Stay: Payer: Medicare Other | Admitting: Internal Medicine

## 2021-02-26 ENCOUNTER — Other Ambulatory Visit: Payer: Medicare Other

## 2021-02-26 DIAGNOSIS — I38 Endocarditis, valve unspecified: Secondary | ICD-10-CM | POA: Diagnosis not present

## 2021-02-26 LAB — GLUCOSE, CAPILLARY
Glucose-Capillary: 216 mg/dL — ABNORMAL HIGH (ref 70–99)
Glucose-Capillary: 150 mg/dL — ABNORMAL HIGH (ref 70–99)
Glucose-Capillary: 167 mg/dL — ABNORMAL HIGH (ref 70–99)
Glucose-Capillary: 232 mg/dL — ABNORMAL HIGH (ref 70–99)

## 2021-02-26 LAB — CBC
HCT: 29.2 % — ABNORMAL LOW (ref 39.0–52.0)
Hemoglobin: 8.7 g/dL — ABNORMAL LOW (ref 13.0–17.0)
MCH: 28.8 pg (ref 26.0–34.0)
MCHC: 29.8 g/dL — ABNORMAL LOW (ref 30.0–36.0)
MCV: 96.7 fL (ref 80.0–100.0)
Platelets: 254 10*3/uL (ref 150–400)
RBC: 3.02 MIL/uL — ABNORMAL LOW (ref 4.22–5.81)
RDW: 18 % — ABNORMAL HIGH (ref 11.5–15.5)
WBC: 7 10*3/uL (ref 4.0–10.5)
nRBC: 0.6 % — ABNORMAL HIGH (ref 0.0–0.2)

## 2021-02-26 LAB — BASIC METABOLIC PANEL
Anion gap: 6 (ref 5–15)
BUN: 18 mg/dL (ref 6–20)
CO2: 30 mmol/L (ref 22–32)
Calcium: 8.6 mg/dL — ABNORMAL LOW (ref 8.9–10.3)
Chloride: 101 mmol/L (ref 98–111)
Creatinine, Ser: 0.92 mg/dL (ref 0.61–1.24)
GFR, Estimated: >60 mL/min (ref >60)
Glucose, Bld: 283 mg/dL — ABNORMAL HIGH (ref 70–99)
Potassium: 3.9 mmol/L (ref 3.5–5.1)
Sodium: 137 mmol/L (ref 135–145)

## 2021-02-26 LAB — MAGNESIUM: Magnesium: 1.6 mg/dL — ABNORMAL LOW (ref 1.7–2.4)

## 2021-02-26 MED ORDER — ENOXAPARIN SODIUM 80 MG/0.8ML IJ SOSY: 1.0000 mg/kg | PREFILLED_SYRINGE | Freq: Two times a day (BID) | INTRAMUSCULAR | 0 refills | Status: DC

## 2021-02-26 MED ORDER — INSULIN GLARGINE 100 UNIT/ML ~~LOC~~ SOLN
25.0000 [IU] | Freq: Every day | SUBCUTANEOUS | Status: DC
  Administered 2021-02-26 – 2021-02-27 (×2): 25 [IU] via SUBCUTANEOUS
  Filled 2021-02-26 (×2): qty 0.25

## 2021-02-26 MED ORDER — INSULIN ASPART 100 UNIT/ML IJ SOLN
4.0000 [IU] | Freq: Three times a day (TID) | INTRAMUSCULAR | Status: DC
  Administered 2021-02-26 – 2021-02-27 (×5): 4 [IU] via SUBCUTANEOUS

## 2021-02-26 NOTE — Progress Notes (Signed)
[facility] for Infectious Disease   Reason for visit: Follow up on vegetation  Interval History: in hospice now and will be discharged to home hospice WBC wnl, afebrile.  Blood cultures remain no growth to date  Physical Exam: Constitutional:  Vitals:   02/26/21 0745 02/26/21 1209  BP:  107/83  Pulse:  93  Resp:  16  Temp:  (!) 97.4 F (36.3 C)  SpO2: 99% 98%   patient appears in NAD Respiratory: Normal respiratory effort; CTA B Cardiovascular: RRR GI: soft, nt, nd  Review of Systems: Constitutional: negative for fevers and chills Gastrointestinal: negative for nausea and diarrhea  Lab Results  Component Value Date   WBC 7.0 02/26/2021   HGB 8.7 (L) 02/26/2021   HCT 29.2 (L) 02/26/2021   MCV 96.7 02/26/2021   PLT 254 02/26/2021    Lab Results  Component Value Date   CREATININE 0.92 02/26/2021   BUN 18 02/26/2021   NA 137 02/26/2021   K 3.9 02/26/2021   CL 101 02/26/2021   CO2 30 02/26/2021    Lab Results  Component Value Date   ALT 19 02/21/2021   AST 21 02/21/2021   ALKPHOS 190 (H) 02/21/2021     Microbiology: Recent Results (from the past 240 hour(s))  Resp Panel by RT-PCR (Flu A&B, Covid) Nasopharyngeal Swab     Status: None   Collection Time: 02/22/21  2:42 PM   Specimen: Nasopharyngeal Swab; Nasopharyngeal(NP) swabs in vial transport medium  Result Value Ref Range Status   SARS Coronavirus 2 by RT PCR NEGATIVE NEGATIVE Final    Comment: (NOTE) SARS-CoV-2 target nucleic acids are NOT DETECTED.  The SARS-CoV-2 RNA is generally detectable in upper respiratory specimens during the acute phase of infection. The lowest concentration of SARS-CoV-2 viral copies this assay can detect is 138 copies/mL. A negative result does not preclude SARS-Cov-2 infection and should not be used as the sole basis for treatment or other patient management decisions. A negative result may occur with  improper specimen collection/handling, submission of specimen  other than nasopharyngeal swab, presence of viral mutation(s) within the areas targeted by this assay, and inadequate number of viral copies(<138 copies/mL). A negative result must be combined with clinical observations, patient history, and epidemiological information. The expected result is Negative.  Fact Sheet for Patients:  EntrepreneurPulse.com.au  Fact Sheet for Healthcare Providers:  IncredibleEmployment.be  This test is no t yet approved or cleared by the [place] FDA and  has been authorized for detection and/or diagnosis of SARS-CoV-2 by FDA under an Emergency Use Authorization (EUA). This EUA will remain  in effect (meaning this test can be used) for the duration of the COVID-19 declaration under Section 564(b)(1) of the Act, 21 U.S.C.section 360bbb-3(b)(1), unless the authorization is terminated  or revoked sooner.       Influenza A by PCR NEGATIVE NEGATIVE Final   Influenza B by PCR NEGATIVE NEGATIVE Final    Comment: (NOTE) The Xpert Xpress SARS-CoV-2/FLU/RSV plus assay is intended as an aid in the diagnosis of influenza from Nasopharyngeal swab specimens and should not be used as a sole basis for treatment. Nasal washings and aspirates are unacceptable for Xpert Xpress SARS-CoV-2/FLU/RSV testing.  Fact Sheet for Patients: EntrepreneurPulse.com.au  Fact Sheet for Healthcare Providers: IncredibleEmployment.be  This test is not yet approved or cleared by the [place] FDA and has been authorized for detection and/or diagnosis of SARS-CoV-2 by FDA under an Emergency Use Authorization (EUA). This EUA will remain in  effect (meaning this test can be used) for the duration of the COVID-19 declaration under Section 564(b)(1) of the Act, 21 U.S.C. section 360bbb-3(b)(1), unless the authorization is terminated or revoked.  Performed at [facility], [place] [address] [place], [place] [address]   Urine culture     Status: Abnormal   Collection Time: 02/22/21  4:28 PM   Specimen: Urine, Random  Result Value Ref Range Status   Specimen Description   Final    URINE, RANDOM Performed at [facility] [address] [place], [place] [address]    Special Requests   Final    NONE Performed at [facility], [place] [address], [place], [place] [address]    Culture >=100,000 COLONIES/mL ESCHERICHIA COLI (A)  Final   Report Status 02/24/2021 FINAL  Final   Organism ID, Bacteria ESCHERICHIA COLI (A)  Final      Susceptibility   Escherichia coli - MIC*    AMPICILLIN >=32 RESISTANT Resistant     CEFAZOLIN 16 SENSITIVE Sensitive     CEFEPIME <=0.12 SENSITIVE Sensitive     CEFTRIAXONE <=0.25 SENSITIVE Sensitive     CIPROFLOXACIN <=0.25 SENSITIVE Sensitive     GENTAMICIN <=1 SENSITIVE Sensitive     IMIPENEM 0.5 SENSITIVE Sensitive     NITROFURANTOIN <=16 SENSITIVE Sensitive     TRIMETH/SULFA <=20 SENSITIVE Sensitive     AMPICILLIN/SULBACTAM >=32 RESISTANT Resistant     PIP/TAZO <=4 SENSITIVE Sensitive     * >=100,000 COLONIES/mL ESCHERICHIA COLI  Culture, blood (routine x 2)     Status: None (Preliminary result)   Collection Time: 02/22/21  4:49 PM   Specimen: BLOOD  Result Value Ref Range Status   Specimen Description   Final    BLOOD CHEST UPPER Performed at [facility] [address] [place], [place] [address]    Special Requests   Final    BOTTLES DRAWN AEROBIC AND ANAEROBIC Blood Culture adequate volume Performed at [facility] [address] [place], [place] [address]    Culture   Final    NO GROWTH 4 DAYS Performed at [facility], [place] [address], [place], [place] [address]    Report Status PENDING  Incomplete  Culture, blood (routine x 2)     Status: None (Preliminary result)   Collection Time: 02/22/21  4:49 PM   Specimen: BLOOD  Result Value Ref Range  Status   Specimen Description   Final    BLOOD CHEST UPPER Performed at [facility] [address], [place], [place] [address]    Special Requests   Final    BOTTLES DRAWN AEROBIC AND ANAEROBIC Blood Culture adequate volume Performed at [facility] [address], [place], [place] [address]    Culture   Final    NO GROWTH 4 DAYS Performed at [facility], [place] [address] [place],  [address]    Report Status PENDING  Incomplete    Impression/Plan:  1. TV and ICD vegetation - blood cultures remain negative and no new signs of infection.  At this point will stop antibiotics and observe.    2.  Hospice - now going to hospice care at home.    I will sign off, call with questions

## 2021-02-26 NOTE — Progress Notes (Signed)
PROGRESS NOTE    Dennis [name].  ERD:[id number] DOB: May 28, 1961 DOA: 02/22/2021 PCP: [name], MD   Brief Narrative:  60 y.o. male with medical history significant of metastatic small cell lung cancer on chemotherapy, systolic CHF EF 56% status post AICD, history of cocaine use, HTN, recent CVA, HLD, DM2, lower extremity DVT used to be on Xarelto, chronic hypoxia on 2 L nasal cannula comes to the hospital as he was directed by his oncologist for evaluation of pulmonary embolism.  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  Initially started on empiric antibiotics.  With cultures being negative suspicion for endocarditis was low therefore antibiotics will be discontinued and treated as a blood clot.   Assessment & Plan:   Principal Problem:   Marantic endocarditis Active Problems:   Type II diabetes mellitus with ophthalmic manifestations ([place])   Drug abuse and dependence ([place])   Cardiomyopathy, ischemic   Automatic implantable cardioverter-defibrillator in situ   Chronic systolic CHF (congestive heart failure) ([place])   Hyperlipidemia associated with type 2 diabetes mellitus ([place])   Essential hypertension   Small cell lung cancer, right upper lobe ([place])   Pulmonary embolism ([place])   Acute endocarditis    Tricuspid vegetation extending to ICD lead - Empiric- vancomycin and Rocephin.  Likely this vegetation is a thrombus.  Cultures remain negative.  Should be able to discontinue antibiotics today, defer to infectious disease.  Bilateral pulmonary embolism without cor pulmonale History of DVT -  Lovenox 1 mg/kg every 12 hours, will leave the prescription in the chart - Lower extremity Dopplers- extensive left lower extremity DVT -Had been off his Xarelto for 6 weeks  Metastatic small cell lung cancer was on outpatient Chemo - Follows outpatient [name], he was updated by me that we will be transitioning him  to home with hospice.   Daily dexamethasone.  Urinary tract infection without hematuria - Urine cultures-pansensitive E. coli -  Should transition to Keflex if okay with ID  Chronic congestive heart failure reduced ejection fraction, EF 15% status post ICD - Not on any home medications because of hypotension.  Continue midodrine -Supportive care.  Lasix 20 mg daily -We will reach out to cardiology to get ICD turned off  CKD stage IIIa, stable - Chronic.  Creatinine at baseline of 1.3.   Diabetes mellitus type 2 -  Due to episode of hypoglycemia, change Lantus to 25 units daily.  Decrease Premeal insulin to 4 units  Chronic hypoxia on 2 L nasal cannula History of COPD/emphysema - Bronchodilators. Patient interested in POC (portable oxygen concentrator)- spoke with rep, who advised to speak with hospice team  Hyperlipidemia - Lipitor  Goals of care discussion- after extensive discussion with the patient and his brother has been transitioned to DNR/DNI and home with hospice.   DVT prophylaxis: Lovenox 1 mg/kg Code Status: Full code Family Communication: [name] has been updated by me today  Status is: Inpatient  Remains inpatient appropriate because:Hemodynamically unstable   Dispo: The patient is from: Home              Anticipated d/c is to: Home with hospice              Patient currently is not medically stable to d/c. On Going treatment for PE with Lovenox. In the meantime ongoing arrangements for hospice and family meeting this afternoon   Difficult to place patient No     Subjective: Overall feels ok, no  complaints at this time.  Inquiring about portable O2.   Spoke with the brother who is going to meet with hospice team this afternoon and later on also learned from the nursing staff how to administer Lovenox shots.  Review of Systems Otherwise negative except as per HPI, including: General = no fevers, chills, dizziness,  fatigue HEENT/EYES = negative  for loss of vision, double vision, blurred vision,  sore throa Cardiovascular= negative for chest pain, palpitation Respiratory/lungs= negative for shortness of breath, cough, wheezing; hemoptysis,  Gastrointestinal= negative for nausea, vomiting, abdominal pain Genitourinary= negative for Dysuria MSK = Negative for arthralgia, myalgias Neurology= Negative for headache, numbness, tingling  Psychiatry= Negative for suicidal and homocidal ideation Skin= Negative for Rash    Examination: Constitutional: Not in acute distress, 2L [place]. Elderly frail Respiratory: Clear to auscultation bilaterally Cardiovascular: Normal sinus rhythm, no rubs Abdomen: Nontender nondistended good bowel sounds Musculoskeletal: No edema noted Skin: No rashes seen Neurologic: CN 2-12 grossly intact.  And nonfocal Psychiatric: Normal judgment and insight. Alert and oriented x 3. Normal mood.     Right chest wall Chemo-Port in place  Objective: Vitals:   02/25/21 1958 02/25/21 2144 02/26/21 0500 02/26/21 0745  BP:  101/78 111/76   Pulse:  100 92   Resp:  20 20   Temp:  98.6 F (37 C) 98 F (36.7 C)   TempSrc:  Oral Oral   SpO2: 95% 100% 100% 99%  Weight:   74.1 kg   Height:        Intake/Output Summary (Last 24 hours) at 02/26/2021 0804 Last data filed at 02/25/2021 2200 Gross per 24 hour  Intake 840 ml  Output 675 ml  Net 165 ml   Filed Weights   02/23/21 0533 02/24/21 0558 02/26/21 0500  Weight: 72.8 kg 72.2 kg 74.1 kg     Data Reviewed:   CBC: Recent Labs  Lab 02/21/21 1452 02/22/21 1442 02/23/21 0500 02/24/21 0320 02/25/21 0320 02/26/21 0339  WBC 10.4 7.5 5.3 5.9 5.8 7.0  NEUTROABS 7.6  --   --   --   --   --   HGB 11.5* 10.9* 9.6* 9.7* 9.1* 8.7*  HCT 36.2* 34.5* 30.9* 32.0* 30.1* 29.2*  MCV 91.2 92.2 93.1 95.5 95.3 96.7  PLT 84* 128* 125* 160 216 301   Basic Metabolic Panel: Recent Labs  Lab 02/22/21 1442 02/23/21 0500 02/24/21 0320 02/25/21 0320 02/26/21 0339  NA  134* 133* 138 136 137  K 3.6 3.1* 3.8 3.7 3.9  CL 93* 94* 99 98 101  CO2 34* 29 29 30 30   GLUCOSE 337* 286* 268* 174* 283*  BUN 32* 30* 24* 22* 18  CREATININE 1.39* 1.41* 1.05 1.01 0.92  CALCIUM 9.0 8.4* 8.7* 8.7* 8.6*  MG  --  1.7 1.9 1.7 1.6*   GFR: Estimated Creatinine Clearance: 91.7 mL/min (by C-G formula based on SCr of 0.92 mg/dL). Liver Function Tests: Recent Labs  Lab 02/21/21 1452  AST 21  ALT 19  ALKPHOS 190*  BILITOT 0.5  PROT 6.7  ALBUMIN 3.2*   No results for input(s): LIPASE, AMYLASE in the last 168 hours. No results for input(s): AMMONIA in the last 168 hours. Coagulation Profile: Recent Labs  Lab 02/22/21 1442  INR 1.1   Cardiac Enzymes: No results for input(s): CKTOTAL, CKMB, CKMBINDEX, TROPONINI in the last 168 hours. BNP (last 3 results) No results for input(s): PROBNP in the last 8760 hours. HbA1C: No results for input(s): HGBA1C in the last 72  hours. CBG: Recent Labs  Lab 02/25/21 1211 02/25/21 1242 02/25/21 1745 02/25/21 2141 02/26/21 0723  GLUCAP 67* 105* 202* 129* 216*   Lipid Profile: No results for input(s): CHOL, HDL, LDLCALC, TRIG, CHOLHDL, LDLDIRECT in the last 72 hours. Thyroid Function Tests: No results for input(s): TSH, T4TOTAL, FREET4, T3FREE, THYROIDAB in the last 72 hours. Anemia Panel: No results for input(s): VITAMINB12, FOLATE, FERRITIN, TIBC, IRON, RETICCTPCT in the last 72 hours. Sepsis Labs: No results for input(s): PROCALCITON, LATICACIDVEN in the last 168 hours.  Recent Results (from the past 240 hour(s))  Resp Panel by RT-PCR (Flu A&B, Covid) Nasopharyngeal Swab     Status: None   Collection Time: 02/22/21  2:42 PM   Specimen: Nasopharyngeal Swab; Nasopharyngeal(NP) swabs in vial transport medium  Result Value Ref Range Status   SARS Coronavirus 2 by RT PCR NEGATIVE NEGATIVE Final    Comment: (NOTE) SARS-CoV-2 target nucleic acids are NOT DETECTED.  The SARS-CoV-2 RNA is generally detectable in upper  respiratory specimens during the acute phase of infection. The lowest concentration of SARS-CoV-2 viral copies this assay can detect is 138 copies/mL. A negative result does not preclude SARS-Cov-2 infection and should not be used as the sole basis for treatment or other patient management decisions. A negative result may occur with  improper specimen collection/handling, submission of specimen other than nasopharyngeal swab, presence of viral mutation(s) within the areas targeted by this assay, and inadequate number of viral copies(<138 copies/mL). A negative result must be combined with clinical observations, patient history, and epidemiological information. The expected result is Negative.  Fact Sheet for Patients:  EntrepreneurPulse.com.au  Fact Sheet for Healthcare Providers:  IncredibleEmployment.be  This test is no t yet approved or cleared by the [place] FDA and  has been authorized for detection and/or diagnosis of SARS-CoV-2 by FDA under an Emergency Use Authorization (EUA). This EUA will remain  in effect (meaning this test can be used) for the duration of the COVID-19 declaration under Section 564(b)(1) of the Act, 21 U.S.C.section 360bbb-3(b)(1), unless the authorization is terminated  or revoked sooner.       Influenza A by PCR NEGATIVE NEGATIVE Final   Influenza B by PCR NEGATIVE NEGATIVE Final    Comment: (NOTE) The Xpert Xpress SARS-CoV-2/FLU/RSV plus assay is intended as an aid in the diagnosis of influenza from Nasopharyngeal swab specimens and should not be used as a sole basis for treatment. Nasal washings and aspirates are unacceptable for Xpert Xpress SARS-CoV-2/FLU/RSV testing.  Fact Sheet for Patients: EntrepreneurPulse.com.au  Fact Sheet for Healthcare Providers: IncredibleEmployment.be  This test is not yet approved or cleared by the [place] FDA and has been  authorized for detection and/or diagnosis of SARS-CoV-2 by FDA under an Emergency Use Authorization (EUA). This EUA will remain in effect (meaning this test can be used) for the duration of the COVID-19 declaration under Section 564(b)(1) of the Act, 21 U.S.C. section 360bbb-3(b)(1), unless the authorization is terminated or revoked.  Performed at [facility], [place] [address], [place], [place] [address]   Urine culture     Status: Abnormal   Collection Time: 02/22/21  4:28 PM   Specimen: Urine, Random  Result Value Ref Range Status   Specimen Description   Final    URINE, RANDOM Performed at [facility] [address] [place], [place] [address]    Special Requests   Final    NONE Performed at [facility], [place] [address] [place],  [place] [address]    Culture >=100,000 COLONIES/mL ESCHERICHIA COLI (A)  Final   Report Status 02/24/2021 FINAL  Final   Organism ID, Bacteria ESCHERICHIA COLI (A)  Final      Susceptibility   Escherichia coli - MIC*    AMPICILLIN >=32 RESISTANT Resistant     CEFAZOLIN 16 SENSITIVE Sensitive     CEFEPIME <=0.12 SENSITIVE Sensitive     CEFTRIAXONE <=0.25 SENSITIVE Sensitive     CIPROFLOXACIN <=0.25 SENSITIVE Sensitive     GENTAMICIN <=1 SENSITIVE Sensitive     IMIPENEM 0.5 SENSITIVE Sensitive     NITROFURANTOIN <=16 SENSITIVE Sensitive     TRIMETH/SULFA <=20 SENSITIVE Sensitive     AMPICILLIN/SULBACTAM >=32 RESISTANT Resistant     PIP/TAZO <=4 SENSITIVE Sensitive     * >=100,000 COLONIES/mL ESCHERICHIA COLI  Culture, blood (routine x 2)     Status: None (Preliminary result)   Collection Time: 02/22/21  4:49 PM   Specimen: BLOOD  Result Value Ref Range Status   Specimen Description   Final    BLOOD CHEST UPPER Performed at [facility] [address], [place], [place] [address]    Special Requests   Final    BOTTLES DRAWN AEROBIC AND ANAEROBIC Blood Culture adequate  volume Performed at [facility] [address], [place], [place] [address]    Culture   Final    NO GROWTH 4 DAYS Performed at [facility], [place] [address], [place], [place] [address]    Report Status PENDING  Incomplete  Culture, blood (routine x 2)     Status: None (Preliminary result)   Collection Time: 02/22/21  4:49 PM   Specimen: BLOOD  Result Value Ref Range Status   Specimen Description   Final    BLOOD CHEST UPPER Performed at [facility] [address], [place], [place] [address]    Special Requests   Final    BOTTLES DRAWN AEROBIC AND ANAEROBIC Blood Culture adequate volume Performed at [facility] [address] [place], [place] [address]    Culture   Final    NO GROWTH 4 DAYS Performed at [facility], [place] [address] [place], [place] [address]    Report Status PENDING  Incomplete         Radiology Studies: No results found.      Scheduled Meds: . atorvastatin  20 mg Oral Daily  . Chlorhexidine Gluconate Cloth  6 each Topical Daily  . dexamethasone  1 mg Oral Daily  . enoxaparin (LOVENOX) injection  70 mg Subcutaneous BID  . furosemide  20 mg Oral Daily  . insulin aspart  0-5 Units Subcutaneous QHS  . insulin aspart  0-9 Units Subcutaneous TID WC  . insulin aspart  6 Units Subcutaneous TID AC  . insulin glargine  25 Units Subcutaneous BID  . midodrine  10 mg Oral TID WC  . mometasone-formoterol  2 puff Inhalation BID  . senna  1 tablet Oral BID  . sodium chloride flush  10-40 mL Intracatheter Q12H  . tamsulosin  0.4 mg Oral Daily  . umeclidinium bromide  1 puff Inhalation Daily   Continuous Infusions: . cefTRIAXone (ROCEPHIN)  IV 2 g (02/25/21 2205)  . vancomycin 750 mg (02/26/21 0538)     LOS: 4 days   Time spent= 35 mins    Shaylene Paganelli Arsenio Loader, MD [facility]  If 7PM-7AM, please contact night-coverage  02/26/2021, 8:04 AM

## 2021-02-26 NOTE — Progress Notes (Addendum)
Manufacturing engineer (ACC)  Met with patient and his brother at the bedside. Explained hospice philosophy and expected plan of care.   Avan is in agreement with current dc plan, which includes stopping chemotherapy and deactivating his ICD prior to dc home with hospice. Per attending, arrangements have been made to deactivate prior to leaving.   Current plan is to dc home with lovenox injections. Family is aware that this in not something on our formulary and arrangements need to be made to have medication prescription sent to the [facility] for them to pick up, where they have an arrangement for free medications per report from his brother.  DME ordered to include: hospital bed, 3n1, O2, transport wheelchair.  Please arrange for any comfort meds that may be needed as well.   Thank you, Venia Carbon RN, BSN, [facility] Liaison

## 2021-02-26 NOTE — Plan of Care (Signed)

## 2021-02-27 ENCOUNTER — Ambulatory Visit: Payer: Medicare Other

## 2021-02-27 ENCOUNTER — Telehealth: Payer: Self-pay | Admitting: Medical Oncology

## 2021-02-27 DIAGNOSIS — I38 Endocarditis, valve unspecified: Secondary | ICD-10-CM | POA: Diagnosis not present

## 2021-02-27 DIAGNOSIS — Z515 Encounter for palliative care: Secondary | ICD-10-CM

## 2021-02-27 LAB — BASIC METABOLIC PANEL
Anion gap: 6 (ref 5–15)
BUN: 22 mg/dL — ABNORMAL HIGH (ref 6–20)
CO2: 29 mmol/L (ref 22–32)
Calcium: 8.7 mg/dL — ABNORMAL LOW (ref 8.9–10.3)
Chloride: 102 mmol/L (ref 98–111)
Creatinine, Ser: 0.97 mg/dL (ref 0.61–1.24)
GFR, Estimated: >60 mL/min (ref >60)
Glucose, Bld: 329 mg/dL — ABNORMAL HIGH (ref 70–99)
Potassium: 4.2 mmol/L (ref 3.5–5.1)
Sodium: 137 mmol/L (ref 135–145)

## 2021-02-27 LAB — CBC
HCT: 28.9 % — ABNORMAL LOW (ref 39.0–52.0)
Hemoglobin: 8.6 g/dL — ABNORMAL LOW (ref 13.0–17.0)
MCH: 29.1 pg (ref 26.0–34.0)
MCHC: 29.8 g/dL — ABNORMAL LOW (ref 30.0–36.0)
MCV: 97.6 fL (ref 80.0–100.0)
Platelets: 278 10*3/uL (ref 150–400)
RBC: 2.96 MIL/uL — ABNORMAL LOW (ref 4.22–5.81)
RDW: 18.2 % — ABNORMAL HIGH (ref 11.5–15.5)
WBC: 7.6 10*3/uL (ref 4.0–10.5)
nRBC: 2.2 % — ABNORMAL HIGH (ref 0.0–0.2)

## 2021-02-27 LAB — CULTURE, BLOOD (ROUTINE X 2)
Culture: NO GROWTH
Culture: NO GROWTH
Special Requests: ADEQUATE
Special Requests: ADEQUATE

## 2021-02-27 LAB — GLUCOSE, CAPILLARY
Glucose-Capillary: 249 mg/dL — ABNORMAL HIGH (ref 70–99)
Glucose-Capillary: 185 mg/dL — ABNORMAL HIGH (ref 70–99)
Glucose-Capillary: 223 mg/dL — ABNORMAL HIGH (ref 70–99)

## 2021-02-27 LAB — MAGNESIUM: Magnesium: 1.6 mg/dL — ABNORMAL LOW (ref 1.7–2.4)

## 2021-02-27 MED ORDER — ACETAMINOPHEN 325 MG PO TABS: 650.0000 mg | ORAL_TABLET | Freq: Four times a day (QID) | ORAL | Status: AC | PRN

## 2021-02-27 MED ORDER — HYDROCODONE-ACETAMINOPHEN 5-325 MG PO TABS: 1.0000 | ORAL_TABLET | ORAL | 0 refills | Status: AC | PRN

## 2021-02-27 MED ORDER — GUAIFENESIN 100 MG/5ML PO SOLN: 5.0000 mL | ORAL | 0 refills | Status: DC | PRN

## 2021-02-27 MED ORDER — HEPARIN SOD (PORK) LOCK FLUSH 100 UNIT/ML IV SOLN
500.0000 [IU] | INTRAVENOUS | Status: AC | PRN
  Administered 2021-02-27: 500 [IU]

## 2021-02-27 MED ORDER — LANTUS SOLOSTAR 100 UNIT/ML ~~LOC~~ SOPN: 25.0000 [IU] | PEN_INJECTOR | Freq: Every day | SUBCUTANEOUS | 2 refills | Status: AC

## 2021-02-27 NOTE — TOC Transition Note (Signed)
Transition of Care [facility]) - CM/SW Discharge Note   Patient Details  Name: Dennis [name]. MRN: [id number] Date of Birth: Dec 03, 1960  [facility] CM/SW Contact:  Dessa Phi, RN Phone Number: 02/27/2021, 11:35 AM   Clinical Narrative: d/c today home w/hospice services-[facility] following. Awaiting lovenox injection prior to d/c this evening. Per brother Dennis [name] he has already been taught lovenox injections-he will get script filled today, & will do injections @ home independent of [facility] services. Travel home 02 tank to be delivered to rm prior d/c through [place] contract service w/[employer].All other DME has been delivered.Family to transport home on own.Nsg insure DNR form goes with patient home. .No further CM needs.    Final next level of care: Home w Hospice Care Barriers to Discharge: No Barriers Identified   Patient Goals and CMS Choice Patient states their goals for this hospitalization and ongoing recovery are:: go home w/hospice services CMS Medicare.gov Compare Post Acute Care list provided to:: Patient Represenative (must comment) (Brother Dennis [name] [phone]) Choice offered to / list presented to : Sibling  Discharge Placement                       Discharge Plan and Services   Discharge Planning Services: CM Consult                                 Social Determinants of Health (SDOH) Interventions     Readmission Risk Interventions No flowsheet data found.

## 2021-02-27 NOTE — Progress Notes (Signed)
Pt aox4, discharge home with hospice, pt and brother provided discharge instructions and prescriptions, verbalize understanding.

## 2021-02-27 NOTE — Plan of Care (Signed)

## 2021-02-27 NOTE — Discharge Summary (Signed)
Physician Discharge Summary  Dennis [name]. HBZ:[id number] DOB: 05/11/1961 DOA: 02/22/2021  PCP: [name], MD  Admit date: 02/22/2021 Discharge date: 02/27/2021  Admitted From: home Disposition:  Home with hospice  Recommendations for Outpatient Follow-up:  1. Follow up with home hospice agency    Home Health: No  Equipment/Devices: hospital bed, 3n1, O2, transport wheelchair   Discharge Condition: Stable  CODE STATUS: DNR  Diet recommendation: Heart Healthy      Discharge Diagnoses: Principal Problem:   Marantic endocarditis Active Problems:   Type II diabetes mellitus with ophthalmic manifestations ([name])   Drug abuse and dependence ([name])   Cardiomyopathy, ischemic   Automatic implantable cardioverter-defibrillator in situ   Chronic systolic CHF (congestive heart failure) (H[facility])   Hyperlipidemia associated with type 2 diabetes mellitus ([name])   Essential hypertension   Small cell lung cancer, right upper lobe ([name])   Pulmonary embolism ([name])   Acute endocarditis   Hospice care patient    Summary of HPI and Hospital Course:  60 y.o. male with medical history significant of metastatic small cell lung cancer on chemotherapy, systolic CHF EF 10% status post AICD, history of cocaine use, HTN, recent CVA, HLD, DM2, lower extremity DVT used to be on Xarelto, chronic hypoxia on 2 L nasal cannula comes to the hospital as he was directed by his oncologist for evaluation of pulmonary embolism.  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  Initially started on empiric antibiotics.  With cultures being negative suspicion for endocarditis was low therefore antibiotics discontinued and patient remains clinically stable.  Treating for PE/VT with full dose Lovenox.  Plan is to stop further chemotherapy, disable ICD, and discharge home with hospice today as patient is clinically improved and stable for further management at  home with hospice following.  He will receive this evening dose of Lovenox prior to discharge.     Tricuspid vegetation extending to ICD lead due to thrombus - Initially treated with empiric vancomycin and Rocephin.   --Likely this vegetation is a thrombus.   --Cultures negative. --Per ID, antibiotics discontinued and patient has remained clinically stable/improved --Continue Lovenox as below  Bilateral pulmonary embolism without cor pulmonale History of DVT - Lovenox 1 mg/kg every 12 hours, will leave the prescription in the chart -Lower extremity Dopplers- extensive left lower extremity DVT -Had been off his Xarelto for 6 weeks  Metastatic small cell lung cancer was on outpatient Chemo -Followed by [name], updated on transitioning him to home with hospice.  --Daily dexamethasone.  Urinary tract infection without hematuria, treated -Urine cultures-pansensitive E. Coli - stable and asymptomatic off antibiotics  Chronic congestive heart failure reduced ejection fraction, EF 15% status post ICD -Not on any home medications because of hypotension. Continue midodrine -Lasix 20 mg daily -Cardiology to disable ICD   CKD stage IIIa, stable -Chronic. Creatinine at baseline of 1.3.   Diabetes mellitus type 2 - Due to episode of hypoglycemia, decreased Lantus to 25 units daily (From twice daily).  Decrease Premeal insulin to 4 units  Chronic hypoxia on 2 L nasal cannula History of COPD/emphysema -Bronchodilators. Patient interested in POC (portable oxygen concentrator)- spoke with rep, who advised to speak with hospice team  Hyperlipidemia -Lipitor  Goals of care discussion- after extensive discussion with the patient and his brother has been transitioned to DNR/DNI and home with hospice.    Discharge Instructions   Discharge Instructions    Call MD for:   Complete by:  As directed    Worsening shortness of breath   Call MD for:  severe  uncontrolled pain   Complete by: As directed    Diet - low sodium heart healthy   Complete by: As directed    Increase activity slowly   Complete by: As directed      Allergies as of 02/27/2021   No Known Allergies     Medication List    STOP taking these medications   potassium chloride SA 20 MEQ tablet Commonly known as: KLOR-CON   rivaroxaban 20 MG Tabs tablet Commonly known as: XARELTO   Vitamin D3 25 MCG tablet Commonly known as: Vitamin D     TAKE these medications   Accu-Chek Softclix Lancets lancets   acetaminophen 325 MG tablet Commonly known as: TYLENOL Take 2 tablets (650 mg total) by mouth every 6 (six) hours as needed for mild pain (or Fever >/= 101).   atorvastatin 20 MG tablet Commonly known as: LIPITOR TAKE 1 TABLET BY MOUTH DAILY AT 6 PM. What changed: when to take this   budesonide-formoterol 160-4.5 MCG/ACT inhaler Commonly known as: SYMBICORT Inhale 2 puffs into the lungs 2 (two) times daily.   dexamethasone 1 MG tablet Commonly known as: DECADRON Take 1 tablet (1 mg total) by mouth daily.   enoxaparin 80 MG/0.8ML injection Commonly known as: LOVENOX Inject 0.75 mLs (75 mg total) into the skin every 12 (twelve) hours.   feeding supplement Liqd Take 237 mLs by mouth 2 (two) times daily between meals. What changed: when to take this   Fifty50 Glucose Meter 2.0 w/Device Kit Use as instructed   furosemide 20 MG tablet Commonly known as: LASIX Take 20 mg by mouth daily.   guaiFENesin 100 MG/5ML Soln Commonly known as: ROBITUSSIN Take 5 mLs (100 mg total) by mouth every 4 (four) hours as needed for cough or to loosen phlegm.   HYDROcodone-acetaminophen 5-325 MG tablet Commonly known as: NORCO/VICODIN Take 1-2 tablets by mouth every 4 (four) hours as needed for moderate pain.   insulin lispro 100 UNIT/ML KwikPen Commonly known as: HumaLOG KwikPen Inject 6 Units into the skin 3 (three) times daily. What changed: when to take this    Insupen Ultrafin 31G X 8 MM Misc Generic drug: Insulin Pen Needle   Lantus SoloStar 100 UNIT/ML Solostar Pen Generic drug: insulin glargine Inject 25 Units into the skin daily. What changed: when to take this   methocarbamol 500 MG tablet Commonly known as: ROBAXIN Take 1 tablet (500 mg total) by mouth every 6 (six) hours as needed for muscle spasms.   midodrine 10 MG tablet Commonly known as: PROAMATINE Take 1 tablet (10 mg total) by mouth 3 (three) times daily with meals. What changed: when to take this   prochlorperazine 10 MG tablet Commonly known as: COMPAZINE Take 10 mg by mouth every 6 (six) hours as needed for nausea or vomiting.   senna 8.6 MG Tabs tablet Commonly known as: SENOKOT Take 1 tablet (8.6 mg total) by mouth 2 (two) times daily. What changed:   when to take this  reasons to take this   Spiriva HandiHaler 18 MCG inhalation capsule Generic drug: tiotropium Place 1 capsule (18 mcg total) into inhaler and inhale daily.   tamsulosin 0.4 MG Caps capsule Commonly known as: FLOMAX Take 0.4 mg by mouth daily.       No Known Allergies   If you experience worsening of your admission symptoms, develop shortness of breath, life threatening emergency, suicidal  or homicidal thoughts you must seek medical attention immediately by calling 911 or calling your MD immediately  if symptoms less severe.    Please note   You were cared for by a hospitalist during your hospital stay. If you have any questions about your discharge medications or the care you received while you were in the hospital after you are discharged, you can call the unit and asked to speak with the hospitalist on call if the hospitalist that took care of you is not available. Once you are discharged, your primary care physician will handle any further medical issues. Please note that NO REFILLS for any discharge medications will be authorized once you are discharged, as it is imperative that you  return to your primary care physician (or establish a relationship with a primary care physician if you do not have one) for your aftercare needs so that they can reassess your need for medications and monitor your lab values.   Consultations:  Infectious disease   Cardiology  Palliative care   Procedures/Studies: CT Chest W Contrast  Result Date: 02/22/2021 CLINICAL DATA:  Primary Cancer Type: Lung Imaging Indication: Assess response to therapy Interval therapy since last imaging? Yes Initial Cancer Diagnosis Date: 12/28/2019; Established by: Presumed Detailed Pathology: Extensive stage small cell lung cancer. Primary Tumor location: Right middle lobe.  Brain metastasis. Surgeries: ICD.  Cholecystectomy. Chemotherapy: Yes; Ongoing? Yes; Most recent administration: 02/05/2021 Immunotherapy?  Yes; Type: Imfinzi; Ongoing? No Radiation therapy? Yes; Date Range: 01/04/2020 - 01/17/2020; Target: Brain EXAM: CT CHEST, ABDOMEN, AND PELVIS WITH CONTRAST TECHNIQUE: Multidetector CT imaging of the chest, abdomen and pelvis was performed following the standard protocol during bolus administration of intravenous contrast. CONTRAST:  74mL OMNIPAQUE IOHEXOL 300 MG/ML  SOLN COMPARISON:  Most recent CT chest, abdomen and pelvis 12/24/2020. 01/17/2020 PET-CT. FINDINGS: CT CHEST FINDINGS Cardiovascular: Left chest multi lead pacer defibrillator. Right chest port catheter. Aortic atherosclerosis. Global cardiomegaly. Three-vessel coronary artery calcifications and/or stents. No pericardial effusion. Incidental note of new bilateral pulmonary embolism, noted in the distal right pulmonary artery and more distal vessels as well as in the lobar to segmental vessels of the left lower lobe. Mediastinum/Nodes: Interval decrease in size of previously enlarged pretracheal, subcarinal, and right hilar lymph nodes, largest pretracheal node measuring 2.0 x 1.0 cm, previously 2.3 x 1.8 cm when measured similarly (series 3, image  25). Thyroid gland, trachea, and esophagus demonstrate no significant findings. Lungs/Pleura: Moderate centrilobular emphysema. Diffuse bilateral bronchial wall thickening. Interval resolution of previously seen small bilateral pleural effusions. Interval decrease in size of a spiculated nodule of the posterior lateral segment right middle lobe abutting the major fissure, measuring 1.3 x 0.7 cm previously 1.6 x 1.0 cm when measured similarly (series 8, image 102). Musculoskeletal: No chest wall mass or suspicious bone lesions identified. CT ABDOMEN PELVIS FINDINGS Hepatobiliary: No focal liver abnormality is seen. Status post cholecystectomy. No biliary dilatation. Pancreas: Unremarkable. No pancreatic ductal dilatation or surrounding inflammatory changes. Spleen: Normal in size without significant abnormality. Adrenals/Urinary Tract: Stable, definitively benign right adrenal adenoma (series 3, image 60). Punctuate nonobstructive calculus of the inferior pole of the right kidney. No ureteral calculus or hydronephrosis. Thickening of the urinary bladder wall. Stomach/Bowel: Stomach is within normal limits. Appendix appears normal. No evidence of bowel wall thickening, distention, or inflammatory changes. Vascular/Lymphatic: Aortic atherosclerosis. No enlarged abdominal or pelvic lymph nodes. Reproductive: No mass or other abnormality. Other: No abdominal wall hernia or abnormality. No abdominopelvic ascites. Musculoskeletal: No acute or significant  osseous findings. IMPRESSION: 1. Interval decrease in size of a spiculated nodule of the posterior lateral segment right middle lobe. 2. Interval decrease in size of previously enlarged pretracheal, subcarinal, and right hilar lymph nodes. 3. Interval resolution of previously seen small bilateral pleural effusions. 4. Findings are consistent with treatment response. 5. Incidental note of new bilateral pulmonary embolism, noted in the distal right pulmonary artery and more  distal vessels as well as in the lobar to segmental vessels of the left lower lobe. 6. No evidence of metastatic disease in the abdomen or pelvis. 7. Nonobstructive right nephrolithiasis. 8. New thickening of the urinary bladder wall, which may be due to nonspecific infectious or inflammatory cystitis. Correlate with urinalysis. 9. Emphysema. 10. Cardiomegaly and coronary artery disease. Call report request to [place] was placed at the time of interpretation. Final communication will be documented. Aortic Atherosclerosis (ICD10-I70.0) and Emphysema (ICD10-J43.9). Electronically Signed   By: Eddie Candle M.D.   On: 02/22/2021 11:11   CT Abdomen Pelvis W Contrast  Result Date: 02/22/2021 CLINICAL DATA:  Primary Cancer Type: Lung Imaging Indication: Assess response to therapy Interval therapy since last imaging? Yes Initial Cancer Diagnosis Date: 12/28/2019; Established by: Presumed Detailed Pathology: Extensive stage small cell lung cancer. Primary Tumor location: Right middle lobe.  Brain metastasis. Surgeries: ICD.  Cholecystectomy. Chemotherapy: Yes; Ongoing? Yes; Most recent administration: 02/05/2021 Immunotherapy?  Yes; Type: Imfinzi; Ongoing? No Radiation therapy? Yes; Date Range: 01/04/2020 - 01/17/2020; Target: Brain EXAM: CT CHEST, ABDOMEN, AND PELVIS WITH CONTRAST TECHNIQUE: Multidetector CT imaging of the chest, abdomen and pelvis was performed following the standard protocol during bolus administration of intravenous contrast. CONTRAST:  37mL OMNIPAQUE IOHEXOL 300 MG/ML  SOLN COMPARISON:  Most recent CT chest, abdomen and pelvis 12/24/2020. 01/17/2020 PET-CT. FINDINGS: CT CHEST FINDINGS Cardiovascular: Left chest multi lead pacer defibrillator. Right chest port catheter. Aortic atherosclerosis. Global cardiomegaly. Three-vessel coronary artery calcifications and/or stents. No pericardial effusion. Incidental note of new bilateral pulmonary embolism, noted in the distal right  pulmonary artery and more distal vessels as well as in the lobar to segmental vessels of the left lower lobe. Mediastinum/Nodes: Interval decrease in size of previously enlarged pretracheal, subcarinal, and right hilar lymph nodes, largest pretracheal node measuring 2.0 x 1.0 cm, previously 2.3 x 1.8 cm when measured similarly (series 3, image 25). Thyroid gland, trachea, and esophagus demonstrate no significant findings. Lungs/Pleura: Moderate centrilobular emphysema. Diffuse bilateral bronchial wall thickening. Interval resolution of previously seen small bilateral pleural effusions. Interval decrease in size of a spiculated nodule of the posterior lateral segment right middle lobe abutting the major fissure, measuring 1.3 x 0.7 cm previously 1.6 x 1.0 cm when measured similarly (series 8, image 102). Musculoskeletal: No chest wall mass or suspicious bone lesions identified. CT ABDOMEN PELVIS FINDINGS Hepatobiliary: No focal liver abnormality is seen. Status post cholecystectomy. No biliary dilatation. Pancreas: Unremarkable. No pancreatic ductal dilatation or surrounding inflammatory changes. Spleen: Normal in size without significant abnormality. Adrenals/Urinary Tract: Stable, definitively benign right adrenal adenoma (series 3, image 60). Punctuate nonobstructive calculus of the inferior pole of the right kidney. No ureteral calculus or hydronephrosis. Thickening of the urinary bladder wall. Stomach/Bowel: Stomach is within normal limits. Appendix appears normal. No evidence of bowel wall thickening, distention, or inflammatory changes. Vascular/Lymphatic: Aortic atherosclerosis. No enlarged abdominal or pelvic lymph nodes. Reproductive: No mass or other abnormality. Other: No abdominal wall hernia or abnormality. No abdominopelvic ascites. Musculoskeletal: No acute or significant osseous findings. IMPRESSION: 1. Interval decrease in size  of a spiculated nodule of the posterior lateral segment right middle  lobe. 2. Interval decrease in size of previously enlarged pretracheal, subcarinal, and right hilar lymph nodes. 3. Interval resolution of previously seen small bilateral pleural effusions. 4. Findings are consistent with treatment response. 5. Incidental note of new bilateral pulmonary embolism, noted in the distal right pulmonary artery and more distal vessels as well as in the lobar to segmental vessels of the left lower lobe. 6. No evidence of metastatic disease in the abdomen or pelvis. 7. Nonobstructive right nephrolithiasis. 8. New thickening of the urinary bladder wall, which may be due to nonspecific infectious or inflammatory cystitis. Correlate with urinalysis. 9. Emphysema. 10. Cardiomegaly and coronary artery disease. Call report request to [place] was placed at the time of interpretation. Final communication will be documented. Aortic Atherosclerosis (ICD10-I70.0) and Emphysema (ICD10-J43.9). Electronically Signed   By: Eddie Candle M.D.   On: 02/22/2021 11:11   ECHOCARDIOGRAM COMPLETE  Result Date: 02/22/2021    ECHOCARDIOGRAM REPORT   Patient Name:   Coalton Arch. Date of Exam: 02/22/2021 Medical Rec #:  [id number]       Height:       72.0 in Accession #:    [id number]      Weight:       175.4 lb Date of Birth:  January 17, 1961        BSA:          2.015 m Patient Age:    60 years        BP:           108/80 mmHg Patient Gender: M               HR:           93 bpm. Exam Location:  Inpatient Procedure: 2D Echo, Color Doppler and Cardiac Doppler Indications:    Pulmonary embolus  History:        Patient has prior history of Echocardiogram examinations, most                 recent 12/26/2020. CHF, CAD; Risk Factors:Diabetes.  Sonographer:    Cammy Brochure Referring Phys: [phone] [place]  1. Left ventricular ejection fraction, by estimation, is <20%. The left ventricle has severely decreased function. The left ventricle demonstrates global hypokinesis. The left  ventricular internal cavity size was mildly dilated. There is mild left ventricular hypertrophy. Left ventricular diastolic parameters are indeterminate.  2. Right ventricular systolic function is moderately reduced. The right ventricular size is mildly enlarged. Tricuspid regurgitation signal is inadequate for assessing PA pressure.  3. Left atrial size was mildly dilated.  4. The mitral valve is normal in structure. Trivial mitral valve regurgitation. No evidence of mitral stenosis.  5. The tricuspid valve is abnormal. There is a 2.4 x 1.4 cm vegetation that appears to be attached to the tricuspid valve as well as vegetation attached to the ICD lead as it transits the tricuspid valve. The vegetation is not completely visualized on this study, but I think this looks more like endocarditis than a sterile thrombus in transit. Would followup with TEE.  6. The aortic valve is tricuspid. Aortic valve regurgitation is trivial.  7. Aortic dilatation noted. There is mild dilatation of the aortic root, measuring 37 mm.  8. The inferior vena cava is normal in size with greater than 50% respiratory variability, suggesting right atrial pressure of 3 mmHg. FINDINGS  Left Ventricle: Left ventricular ejection  fraction, by estimation, is <20%. The left ventricle has severely decreased function. The left ventricle demonstrates global hypokinesis. The left ventricular internal cavity size was mildly dilated. There is mild left ventricular hypertrophy. Left ventricular diastolic parameters are indeterminate. Right Ventricle: The right ventricular size is mildly enlarged. No increase in right ventricular wall thickness. Right ventricular systolic function is moderately reduced. Tricuspid regurgitation signal is inadequate for assessing PA pressure. Left Atrium: Left atrial size was mildly dilated. Right Atrium: Right atrial size was normal in size. Pericardium: There is no evidence of pericardial effusion. Mitral Valve: The mitral  valve is normal in structure. Trivial mitral valve regurgitation. No evidence of mitral valve stenosis. Tricuspid Valve: The tricuspid valve is abnormal. Tricuspid valve regurgitation is trivial. Aortic Valve: The aortic valve is tricuspid. Aortic valve regurgitation is trivial. Aortic valve mean gradient measures 2.0 mmHg. Aortic valve peak gradient measures 3.3 mmHg. Aortic valve area, by VTI measures 2.18 cm. Pulmonic Valve: The pulmonic valve was normal in structure. Pulmonic valve regurgitation is not visualized. Aorta: Aortic dilatation noted. There is mild dilatation of the aortic root, measuring 37 mm. Venous: The inferior vena cava is normal in size with greater than 50% respiratory variability, suggesting right atrial pressure of 3 mmHg. IAS/Shunts: No atrial level shunt detected by color flow Doppler. Additional Comments: A device lead is visualized in the right ventricle.  LEFT VENTRICLE PLAX 2D LVIDd:         6.09 cm LVIDs:         5.42 cm LV PW:         1.31 cm LV IVS:        1.31 cm LVOT diam:     2.40 cm LV SV:         27 LV SV Index:   13 LVOT Area:     4.52 cm  RIGHT VENTRICLE RV Basal diam:  4.60 cm LEFT ATRIUM             Index       RIGHT ATRIUM           Index LA diam:        2.50 cm 1.24 cm/m  RA Area:     15.60 cm LA Vol (A2C):   49.2 ml 24.39 ml/m RA Volume:   39.00 ml  19.36 ml/m LA Vol (A4C):   54.5 ml 27.05 ml/m LA Biplane Vol: 58.8 ml 29.18 ml/m  AORTIC VALVE AV Area (Vmax):    2.22 cm AV Area (Vmean):   2.45 cm AV Area (VTI):     2.18 cm AV Vmax:           90.50 cm/s AV Vmean:          60.600 cm/s AV VTI:            0.124 m AV Peak Grad:      3.3 mmHg AV Mean Grad:      2.0 mmHg LVOT Vmax:         44.40 cm/s LVOT Vmean:        32.800 cm/s LVOT VTI:          0.060 m LVOT/AV VTI ratio: 0.48  AORTA Ao Root diam: 3.70 cm Ao Asc diam:  3.60 cm MITRAL VALVE MV Area (PHT): 6.17 cm     SHUNTS MV Decel Time: 123 msec     Systemic VTI:  0.06 m MV E velocity: 101.00 cm/s  Systemic  Diam: 2.40 cm Loralie Champagne MD Electronically  signed by Loralie Champagne MD Signature Date/Time: 02/22/2021/4:32:59 PM    Final    VAS [place] LOWER EXTREMITY VENOUS (DVT)  Result Date: 02/23/2021  Lower Venous DVT Study Patient Name:  Jaramie Bastos.  Date of Exam:   02/23/2021 Medical Rec #: [id number]        Accession #:    [id number] Date of Birth: [birth year]         Patient Gender: M Patient Age:   59Y Exam Location[facility]  [facility] Procedure:      VAS [place] LOWER EXTREMITY VENOUS (DVT) Referring Phys: [id number] ANKIT CHIRAG AMIN --------------------------------------------------------------------------------  Indications: PE.  Risk Factors: Decreased mobility Cancer (Lung). Anticoagulation: Patient was put on Xarelto 12/2020 after blood clot but stopped after only 1 month (unclear why). Comparison Study: Previous exam 12/19/20 - Positive DVT LLE popliteal Performing Technologist: Rogelia Rohrer  Examination Guidelines: A complete evaluation includes B-mode imaging, spectral Doppler, color Doppler, and power Doppler as needed of all accessible portions of each vessel. Bilateral testing is considered an integral part of a complete examination. Limited examinations for reoccurring indications may be performed as noted. The reflux portion of the exam is performed with the patient in reverse Trendelenburg.  +---------+---------------+---------+-----------+----------+--------------+ RIGHT    CompressibilityPhasicitySpontaneityPropertiesThrombus Aging +---------+---------------+---------+-----------+----------+--------------+ CFV      Full           Yes      Yes                                 +---------+---------------+---------+-----------+----------+--------------+ SFJ      Full                                                        +---------+---------------+---------+-----------+----------+--------------+ FV Prox  Full           Yes      Yes                                  +---------+---------------+---------+-----------+----------+--------------+ FV Mid   Full           Yes      Yes                                 +---------+---------------+---------+-----------+----------+--------------+ FV DistalFull           Yes      Yes                                 +---------+---------------+---------+-----------+----------+--------------+ PFV      Full                                                        +---------+---------------+---------+-----------+----------+--------------+ POP      Full           Yes      Yes                                 +---------+---------------+---------+-----------+----------+--------------+  PTV      Full                                                        +---------+---------------+---------+-----------+----------+--------------+ PERO     Full                                                        +---------+---------------+---------+-----------+----------+--------------+   +---------+---------------+---------+-----------+----------+------------------+ LEFT     CompressibilityPhasicitySpontaneityPropertiesThrombus Aging     +---------+---------------+---------+-----------+----------+------------------+ CFV      Partial        Yes      Yes                  Acute              +---------+---------------+---------+-----------+----------+------------------+ SFJ      Partial                                      Acute - mobile                                                           thrombus           +---------+---------------+---------+-----------+----------+------------------+ FV Prox  None           No       No                   Acute              +---------+---------------+---------+-----------+----------+------------------+ FV Mid   None           No       No                   Acute              +---------+---------------+---------+-----------+----------+------------------+  FV DistalNone           No       No                   Acute              +---------+---------------+---------+-----------+----------+------------------+ PFV      Full                                                            +---------+---------------+---------+-----------+----------+------------------+ POP      None           No       No                   Chronic            +---------+---------------+---------+-----------+----------+------------------+ PTV  None           No       No                   Acute              +---------+---------------+---------+-----------+----------+------------------+ PERO     Partial        No       No                   Acute              +---------+---------------+---------+-----------+----------+------------------+ Gastroc  None           No       No                   Acute              +---------+---------------+---------+-----------+----------+------------------+ SSV      Partial        No       No                   Chronic            +---------+---------------+---------+-----------+----------+------------------+ EIV      Full           Yes      Yes                  Not visualized     +---------+---------------+---------+-----------+----------+------------------+     Summary: BILATERAL: -No evidence of popliteal cyst, bilaterally. RIGHT: - There is no evidence of deep vein thrombosis in the lower extremity. - There is no evidence of superficial venous thrombosis.  LEFT: - Findings consistent with acute deep vein thrombosis involving the left common femoral vein, SF junction, left femoral vein, left posterior tibial veins, left peroneal veins, and left gastrocnemius veins. - Findings consistent with chronic deep vein thrombosis involving the left popliteal vein. - Findings consistent with chronic superficial vein thrombosis involving the left small saphenous vein. - Findings suggest new clot progression as compared to  previous examination.  *See table(s) above for measurements and observations. Electronically signed by Servando Snare MD on 02/23/2021 at 11:05:28 AM.    Final        Subjective: Pt doing well today. Denies fever/chills, dysuria, chest pain, SOB, N/V or other acute complaints.  Looks forward to going home.   Discharge Exam: Vitals:   02/27/21 0739 02/27/21 1221  BP:  101/80  Pulse:  97  Resp:  18  Temp:  98.2 F (36.8 C)  SpO2: 99% 96%   Vitals:   02/26/21 1950 02/27/21 0512 02/27/21 0739 02/27/21 1221  BP: 93/72 109/82  101/80  Pulse: 100 96  97  Resp: _0 Temp: (!) 97.5 F (36.4 C) 98 F (36.7 C)  98.2 F (36.8 C)  TempSrc: Oral Oral  Oral  SpO2: 99% 98% 99% 96%  Weight:  75.1 kg    Height:        General: Pt is alert, awake, not in acute distress Cardiovascular: RRR, S1/S2 +, no rubs, no gallops, port in place right upper anterior chest wall Respiratory: CTA bilaterally, no wheezing, no rhonchi Abdominal: Soft, NT, ND, bowel sounds + Extremities: no edema, no cyanosis    The results of significant diagnostics from this hospitalization (including imaging, microbiology, ancillary and laboratory) are listed below for reference.     Microbiology: Recent  Results (from the past 240 hour(s))  Resp Panel by RT-PCR (Flu A&B, Covid) Nasopharyngeal Swab     Status: None   Collection Time: 02/22/21  2:42 PM   Specimen: Nasopharyngeal Swab; Nasopharyngeal(NP) swabs in vial transport medium  Result Value Ref Range Status   SARS Coronavirus 2 by RT PCR NEGATIVE NEGATIVE Final    Comment: (NOTE) SARS-CoV-2 target nucleic acids are NOT DETECTED.  The SARS-CoV-2 RNA is generally detectable in upper respiratory specimens during the acute phase of infection. The lowest concentration of SARS-CoV-2 viral copies this assay can detect is 138 copies/mL. A negative result does not preclude SARS-Cov-2 infection and should not be used as the sole basis for treatment or other  patient management decisions. A negative result may occur with  improper specimen collection/handling, submission of specimen other than nasopharyngeal swab, presence of viral mutation(s) within the areas targeted by this assay, and inadequate number of viral copies(<138 copies/mL). A negative result must be combined with clinical observations, patient history, and epidemiological information. The expected result is Negative.  Fact Sheet for Patients:  EntrepreneurPulse.com.au  Fact Sheet for Healthcare Providers:  IncredibleEmployment.be  This test is no t yet approved or cleared by the [place] FDA and  has been authorized for detection and/or diagnosis of SARS-CoV-2 by FDA under an Emergency Use Authorization (EUA). This EUA will remain  in effect (meaning this test can be used) for the duration of the COVID-19 declaration under Section 564(b)(1) of the Act, 21 U.S.C.section 360bbb-3(b)(1), unless the authorization is terminated  or revoked sooner.       Influenza A by PCR NEGATIVE NEGATIVE Final   Influenza B by PCR NEGATIVE NEGATIVE Final    Comment: (NOTE) The Xpert Xpress SARS-CoV-2/FLU/RSV plus assay is intended as an aid in the diagnosis of influenza from Nasopharyngeal swab specimens and should not be used as a sole basis for treatment. Nasal washings and aspirates are unacceptable for Xpert Xpress SARS-CoV-2/FLU/RSV testing.  Fact Sheet for Patients: EntrepreneurPulse.com.au  Fact Sheet for Healthcare Providers: IncredibleEmployment.be  This test is not yet approved or cleared by the [place] FDA and has been authorized for detection and/or diagnosis of SARS-CoV-2 by FDA under an Emergency Use Authorization (EUA). This EUA will remain in effect (meaning this test can be used) for the duration of the COVID-19 declaration under Section 564(b)(1) of the Act, 21 U.S.C. section  360bbb-3(b)(1), unless the authorization is terminated or revoked.  Performed at [facility], [place] [address], [place], [place] [address]   Urine culture     Status: Abnormal   Collection Time: 02/22/21  4:28 PM   Specimen: Urine, Random  Result Value Ref Range Status   Specimen Description   Final    URINE, RANDOM Performed at [facility] [address], [place], [place] [address]    Special Requests   Final    NONE Performed at [facility], [place] [address], [place], [place] [address]    Culture >=100,000 COLONIES/mL ESCHERICHIA COLI (A)  Final   Report Status 02/24/2021 FINAL  Final   Organism ID, Bacteria ESCHERICHIA COLI (A)  Final      Susceptibility   Escherichia coli - MIC*    AMPICILLIN >=32 RESISTANT Resistant     CEFAZOLIN 16 SENSITIVE Sensitive     CEFEPIME <=0.12 SENSITIVE Sensitive     CEFTRIAXONE <=0.25 SENSITIVE Sensitive     CIPROFLOXACIN <=0.25 SENSITIVE Sensitive     GENTAMICIN <=1 SENSITIVE Sensitive  IMIPENEM 0.5 SENSITIVE Sensitive     NITROFURANTOIN <=16 SENSITIVE Sensitive     TRIMETH/SULFA <=20 SENSITIVE Sensitive     AMPICILLIN/SULBACTAM >=32 RESISTANT Resistant     PIP/TAZO <=4 SENSITIVE Sensitive     * >=100,000 COLONIES/mL ESCHERICHIA COLI  Culture, blood (routine x 2)     Status: None   Collection Time: 02/22/21  4:49 PM   Specimen: BLOOD  Result Value Ref Range Status   Specimen Description   Final    BLOOD CHEST UPPER Performed at [facility] [address], [place], [place] [address]    Special Requests   Final    BOTTLES DRAWN AEROBIC AND ANAEROBIC Blood Culture adequate volume Performed at [place] [address] [place], [place] [address]    Culture   Final    NO GROWTH 5 DAYS Performed at [facility], [place] [address] [place], [place] [address]    Report Status 02/27/2021 FINAL  Final  Culture, blood (routine x 2)      Status: None   Collection Time: 02/22/21  4:49 PM   Specimen: BLOOD  Result Value Ref Range Status   Specimen Description   Final    BLOOD CHEST UPPER Performed at [place] [address] [place], [place] [address]    Special Requests   Final    BOTTLES DRAWN AEROBIC AND ANAEROBIC Blood Culture adequate volume Performed at [facility] [address], [place], [place] [address]    Culture   Final    NO GROWTH 5 DAYS Performed at [facility], [place] [address], [place], [place] [address]    Report Status 02/27/2021 FINAL  Final     Labs: BNP (last 3 results) Recent Labs    10/30/20 2000 12/25/20 1726 02/22/21 1442  BNP 795.1* 1,073.9* 250.0*   Basic Metabolic Panel: Recent Labs  Lab 02/23/21 0500 02/24/21 0320 02/25/21 0320 02/26/21 0339 02/27/21 0408  NA 133* 138 136 137 137  K 3.1* 3.8 3.7 3.9 4.2  CL 94* 99 98 101 102  CO2 _0 GLUCOSE 286* 268* 174* 283* 329*  BUN 30* 24* 22* 18 22*  CREATININE 1.41* 1.05 1.01 0.92 0.97  CALCIUM 8.4* 8.7* 8.7* 8.6* 8.7*  MG 1.7 1.9 1.7 1.6* 1.6*   Liver Function Tests: Recent Labs  Lab 02/21/21 1452  AST 21  ALT 19  ALKPHOS 190*  BILITOT 0.5  PROT 6.7  ALBUMIN 3.2*   No results for input(s): LIPASE, AMYLASE in the last 168 hours. No results for input(s): AMMONIA in the last 168 hours. CBC: Recent Labs  Lab 02/21/21 1452 02/22/21 1442 02/23/21 0500 02/24/21 0320 02/25/21 0320 02/26/21 0339 02/27/21 0408  WBC 10.4   < > 5.3 5.9 5.8 7.0 7.6  NEUTROABS 7.6  --   --   --   --   --   --   HGB 11.5*   < > 9.6* 9.7* 9.1* 8.7* 8.6*  HCT 36.2*   < > 30.9* 32.0* 30.1* 29.2* 28.9*  MCV 91.2   < > 93.1 95.5 95.3 96.7 97.6  PLT 84*   < > 125* 160 216 254 278   < > = values in this interval not displayed.   Cardiac Enzymes: No results for input(s): CKTOTAL, CKMB, CKMBINDEX, TROPONINI in the last 168 hours. BNP: Invalid input(s): POCBNP CBG: Recent Labs   Lab 02/26/21 1204 02/26/21 1628 02/26/21 2235 02/27/21  0725 02/27/21 1152  GLUCAP 150* 167* 232* 249* 185*   D-Dimer No results for input(s): DDIMER in the last 72 hours. Hgb A1c No results for input(s): HGBA1C in the last 72 hours. Lipid Profile No results for input(s): CHOL, HDL, LDLCALC, TRIG, CHOLHDL, LDLDIRECT in the last 72 hours. Thyroid function studies No results for input(s): TSH, T4TOTAL, T3FREE, THYROIDAB in the last 72 hours.  Invalid input(s): FREET3 Anemia work up No results for input(s): VITAMINB12, FOLATE, FERRITIN, TIBC, IRON, RETICCTPCT in the last 72 hours. Urinalysis    Component Value Date/Time   COLORURINE YELLOW 02/22/2021 1628   APPEARANCEUR CLOUDY (A) 02/22/2021 1628   LABSPEC 1.019 02/22/2021 1628   PHURINE 5.0 02/22/2021 1628   GLUCOSEU 150 (A) 02/22/2021 1628   HGBUR SMALL (A) 02/22/2021 1628   BILIRUBINUR NEGATIVE 02/22/2021 1628   KETONESUR NEGATIVE 02/22/2021 1628   PROTEINUR 100 (A) 02/22/2021 1628   UROBILINOGEN 1.0 07/16/2015 1715   NITRITE POSITIVE (A) 02/22/2021 1628   LEUKOCYTESUR LARGE (A) 02/22/2021 1628   Sepsis Labs Invalid input(s): PROCALCITONIN,  WBC,  LACTICIDVEN Microbiology Recent Results (from the past 240 hour(s))  Resp Panel by RT-PCR (Flu A&B, Covid) Nasopharyngeal Swab     Status: None   Collection Time: 02/22/21  2:42 PM   Specimen: Nasopharyngeal Swab; Nasopharyngeal(NP) swabs in vial transport medium  Result Value Ref Range Status   SARS Coronavirus 2 by RT PCR NEGATIVE NEGATIVE Final    Comment: (NOTE) SARS-CoV-2 target nucleic acids are NOT DETECTED.  The SARS-CoV-2 RNA is generally detectable in upper respiratory specimens during the acute phase of infection. The lowest concentration of SARS-CoV-2 viral copies this assay can detect is 138 copies/mL. A negative result does not preclude SARS-Cov-2 infection and should not be used as the sole basis for treatment or other patient management decisions. A  negative result may occur with  improper specimen collection/handling, submission of specimen other than nasopharyngeal swab, presence of viral mutation(s) within the areas targeted by this assay, and inadequate number of viral copies(<138 copies/mL). A negative result must be combined with clinical observations, patient history, and epidemiological information. The expected result is Negative.  Fact Sheet for Patients:  EntrepreneurPulse.com.au  Fact Sheet for Healthcare Providers:  IncredibleEmployment.be  This test is no t yet approved or cleared by the [place] FDA and  has been authorized for detection and/or diagnosis of SARS-CoV-2 by FDA under an Emergency Use Authorization (EUA). This EUA will remain  in effect (meaning this test can be used) for the duration of the COVID-19 declaration under Section 564(b)(1) of the Act, 21 U.S.C.section 360bbb-3(b)(1), unless the authorization is terminated  or revoked sooner.       Influenza A by PCR NEGATIVE NEGATIVE Final   Influenza B by PCR NEGATIVE NEGATIVE Final    Comment: (NOTE) The Xpert Xpress SARS-CoV-2/FLU/RSV plus assay is intended as an aid in the diagnosis of influenza from Nasopharyngeal swab specimens and should not be used as a sole basis for treatment. Nasal washings and aspirates are unacceptable for Xpert Xpress SARS-CoV-2/FLU/RSV testing.  Fact Sheet for Patients: EntrepreneurPulse.com.au  Fact Sheet for Healthcare Providers: IncredibleEmployment.be  This test is not yet approved or cleared by the [place] FDA and has been authorized for detection and/or diagnosis of SARS-CoV-2 by FDA under an Emergency Use Authorization (EUA). This EUA will remain in effect (meaning this test can be used) for the duration of the COVID-19 declaration under Section 564(b)(1) of the Act, 21 U.S.C. section 360bbb-3(b)(1), unless the  authorization  is terminated or revoked.  Performed at [facility], [place] [address] [place], [place] [address]   Urine culture     Status: Abnormal   Collection Time: 02/22/21  4:28 PM   Specimen: Urine, Random  Result Value Ref Range Status   Specimen Description   Final    URINE, RANDOM Performed at [facility] [address] [place], [place] [address]    Special Requests   Final    NONE Performed at [facility], [place], [place] [address], [place], [place] [address]    Culture >=100,000 COLONIES/mL ESCHERICHIA COLI (A)  Final   Report Status 02/24/2021 FINAL  Final   Organism ID, Bacteria ESCHERICHIA COLI (A)  Final      Susceptibility   Escherichia coli - MIC*    AMPICILLIN >=32 RESISTANT Resistant     CEFAZOLIN 16 SENSITIVE Sensitive     CEFEPIME <=0.12 SENSITIVE Sensitive     CEFTRIAXONE <=0.25 SENSITIVE Sensitive     CIPROFLOXACIN <=0.25 SENSITIVE Sensitive     GENTAMICIN <=1 SENSITIVE Sensitive     IMIPENEM 0.5 SENSITIVE Sensitive     NITROFURANTOIN <=16 SENSITIVE Sensitive     TRIMETH/SULFA <=20 SENSITIVE Sensitive     AMPICILLIN/SULBACTAM >=32 RESISTANT Resistant     PIP/TAZO <=4 SENSITIVE Sensitive     * >=100,000 COLONIES/mL ESCHERICHIA COLI  Culture, blood (routine x 2)     Status: None   Collection Time: 02/22/21  4:49 PM   Specimen: BLOOD  Result Value Ref Range Status   Specimen Description   Final    BLOOD CHEST UPPER Performed at [facility] [address] [place], [place] [address]    Special Requests   Final    BOTTLES DRAWN AEROBIC AND ANAEROBIC Blood Culture adequate volume Performed at [place] [facility] [address] [place], [place] [address]    Culture   Final    NO GROWTH 5 DAYS Performed at [facility], [place] [address], [place], [place] [address]    Report Status 02/27/2021 FINAL  Final  Culture, blood (routine x 2)     Status: None   Collection Time:  02/22/21  4:49 PM   Specimen: BLOOD  Result Value Ref Range Status   Specimen Description   Final    BLOOD CHEST UPPER Performed at [facility] [address] [place], [place] [address]    Special Requests   Final    BOTTLES DRAWN AEROBIC AND ANAEROBIC Blood Culture adequate volume Performed at [facility] [address] [place], [place] [address]    Culture   Final    NO GROWTH 5 DAYS Performed at [facility], [place] [address] [place], [place] [address]    Report Status 02/27/2021 FINAL  Final     Time coordinating discharge: Over 30 minutes  SIGNED:   Ezekiel Slocumb, DO [facility] 02/27/2021, 1:37 PM   If 7PM-7AM, please contact night-coverage [email or web address]

## 2021-02-27 NOTE — Telephone Encounter (Signed)
Dr Julien Nordmann will be hospice attending and this was communicated to Hospice staff.

## 2021-02-27 NOTE — Hospital Course (Addendum)
60 y.o. male with medical history significant of metastatic small cell lung cancer on chemotherapy, systolic CHF EF 82% status post AICD, history of cocaine use, HTN, recent CVA, HLD, DM2, lower extremity DVT used to be on Xarelto, chronic hypoxia on 2 L nasal cannula comes to the hospital as he was directed by his oncologist for evaluation of pulmonary embolism.  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  Initially started on empiric antibiotics.  With cultures being negative suspicion for endocarditis was low therefore antibiotics discontinued and patient remains clinically stable.  Treating for PE/VT with full dose Lovenox.  Plan is to stop further chemotherapy, disable ICD, and discharge home with hospice today as patient is clinically improved and stable for further management at home with hospice following.  He will receive this evening dose of Lovenox prior to discharge.

## 2021-02-27 NOTE — Progress Notes (Addendum)
Manufacturing engineer (ACC)  Addendem:  Spoke with brother [name] while he was at the pharmacy to pick up the lovenox.  With the help of [place], RN, and the team at [facility] was able to get the med picked up this evening.  Dr. Arbutus Ped made aware.  Transport O2 tank is confirmed at bedside.  Pt is set up with an AV visit for hospice tomorrow afternoon to admit onto hospice services.   Spoke with brother [name] by phone to confirm delivery of DME to home last night and to address the plan for today.  [name] verbalized need for transport tank for use at time of discharge; this has been ordered to be delivered by 2pm to [facility].  [name] confirms plan to pick up medications at the [facility] once he verifies that they are in stock.  Plan made for [name] to call or text to confirm meds in place.  Exchanged report with Nuala Alpha who confirms that per bedside RN [name] was instructed on subcutaneous lovenox injections and will be administering these after discharge. [facility] will not manage these injections as they are not a part of the hospice plan of care.  Thank you for the opportunity to participate in this patient's care.   Domenic Moras, BSN, RN [facility] Liaison  [phone] (24h on call)

## 2021-02-28 ENCOUNTER — Ambulatory Visit: Payer: Medicare Other

## 2021-03-06 ENCOUNTER — Inpatient Hospital Stay: Payer: Medicare Other

## 2021-03-06 ENCOUNTER — Other Ambulatory Visit: Payer: Medicare Other

## 2021-03-12 ENCOUNTER — Telehealth: Payer: Self-pay | Admitting: Medical Oncology

## 2021-03-12 NOTE — Telephone Encounter (Signed)
Hospice called and said pt requests refills:  1. Tamsulosin 2. Flomax 3. Budesomide inhaler 4. Midodrine  Prescribed by hospitalists

## 2021-03-13 ENCOUNTER — Inpatient Hospital Stay: Payer: Medicare Other

## 2021-03-13 ENCOUNTER — Other Ambulatory Visit: Payer: Medicare Other

## 2021-03-13 NOTE — Telephone Encounter (Signed)
Med refills _ I LVM to pharmacy to refill:  budesonide ( pt has 12 refills )  And Flomax and to call prescribing MD if needed. I sent email to Irine Seal to refill Midodrine.  Aaron Edelman notified.

## 2021-03-14 ENCOUNTER — Other Ambulatory Visit: Payer: Self-pay | Admitting: Radiation Therapy

## 2021-03-14 ENCOUNTER — Telehealth: Payer: Self-pay | Admitting: Medical Oncology

## 2021-03-14 NOTE — Telephone Encounter (Signed)
Refills have not been done by prescribing MD.I contacted [facility] ( [facility])and spoke to Dennis [name] re all refills .  Symbicort, Midodrine and Flomax, She will send note to Dr Doy Mince and has phone numbers for me. Dennis [name] from hospice and Dennis [name] -pt brother. They may ask him for in person visit or virtual.  I contacted Dennis [name] and Dennis [name] with this information .

## 2021-03-14 NOTE — Telephone Encounter (Signed)
Video visit tomorrow at 2 pm with Dr Doy Mince or [place] . [name] notified and I gave [facility] his number to call for visit.

## 2021-03-19 ENCOUNTER — Other Ambulatory Visit: Payer: Medicare Other

## 2021-03-19 ENCOUNTER — Ambulatory Visit: Payer: Medicare Other | Admitting: Internal Medicine

## 2021-03-19 ENCOUNTER — Encounter: Payer: Medicare Other | Admitting: Nutrition

## 2021-03-19 ENCOUNTER — Ambulatory Visit: Payer: Medicare Other

## 2021-03-20 ENCOUNTER — Ambulatory Visit: Payer: Medicare Other

## 2021-03-21 ENCOUNTER — Ambulatory Visit: Payer: Medicare Other

## 2021-03-22 ENCOUNTER — Ambulatory Visit (HOSPITAL_COMMUNITY): Admission: RE | Admit: 2021-03-22 | Payer: Medicare Other | Source: Ambulatory Visit

## 2021-03-25 ENCOUNTER — Inpatient Hospital Stay: Payer: Medicare Other

## 2021-03-26 ENCOUNTER — Inpatient Hospital Stay: Payer: Medicare Other | Attending: Internal Medicine | Admitting: Internal Medicine

## 2021-03-29 ENCOUNTER — Other Ambulatory Visit: Payer: Self-pay

## 2021-03-29 ENCOUNTER — Telehealth: Payer: Self-pay

## 2021-03-29 DIAGNOSIS — I2699 Other pulmonary embolism without acute cor pulmonale: Secondary | ICD-10-CM

## 2021-03-29 DIAGNOSIS — I82432 Acute embolism and thrombosis of left popliteal vein: Secondary | ICD-10-CM

## 2021-03-29 DIAGNOSIS — C3411 Malignant neoplasm of upper lobe, right bronchus or lung: Secondary | ICD-10-CM

## 2021-03-29 DIAGNOSIS — C3490 Malignant neoplasm of unspecified part of unspecified bronchus or lung: Secondary | ICD-10-CM

## 2021-03-29 MED ORDER — ENOXAPARIN SODIUM 80 MG/0.8ML IJ SOSY: 1.0000 mg/kg | PREFILLED_SYRINGE | Freq: Two times a day (BID) | INTRAMUSCULAR | 0 refills | Status: DC

## 2021-03-29 NOTE — Telephone Encounter (Signed)
Pts Hospice RN called stating pt will need a refill of Lovenox if he is to stay on this injection.  Discussed with Dr. Julien Nordmann who gave verbal authorization for pt to continue this tx. Rx has been sent to confirmed pharmacy on file.

## 2021-03-29 NOTE — Telephone Encounter (Signed)
Rx resent electronically

## 2021-04-04 ENCOUNTER — Telehealth (HOSPITAL_COMMUNITY): Payer: Self-pay | Admitting: Vascular Surgery

## 2021-04-04 NOTE — Telephone Encounter (Signed)
Left pt VM that 7/13 appt w/ DB will be changed to 7/12 @ 2:20. Asked pt o call back to confirm appt

## 2021-04-23 ENCOUNTER — Inpatient Hospital Stay (HOSPITAL_BASED_OUTPATIENT_CLINIC_OR_DEPARTMENT_OTHER)
Admission: RE | Admit: 2021-04-23 | Discharge: 2021-04-23 | Disposition: A | Discharge status: Home / Self Care | Source: Ambulatory Visit | Attending: Internal Medicine | Admitting: Internal Medicine

## 2021-04-23 ENCOUNTER — Encounter: Payer: Self-pay | Admitting: Internal Medicine

## 2021-04-23 DIAGNOSIS — I5022 Chronic systolic (congestive) heart failure: Secondary | ICD-10-CM | POA: Diagnosis not present

## 2021-04-23 NOTE — Progress Notes (Signed)
  Patient did not show for appt  Glori Bickers, MD  12:23 AM

## 2021-04-24 ENCOUNTER — Ambulatory Visit (HOSPITAL_COMMUNITY)
Admission: RE | Admit: 2021-04-24 | Discharge: 2021-04-24 | Disposition: A | Discharge status: Home / Self Care | Source: Ambulatory Visit | Attending: Internal Medicine | Admitting: Internal Medicine

## 2021-04-24 ENCOUNTER — Encounter (HOSPITAL_COMMUNITY): Payer: Medicare Other | Admitting: Internal Medicine

## 2021-04-24 ENCOUNTER — Other Ambulatory Visit: Payer: Self-pay

## 2021-04-24 ENCOUNTER — Encounter: Payer: Self-pay | Admitting: Internal Medicine

## 2021-04-24 VITALS — BP 96/72 | HR 105 | Ht 72.0 in | Wt 174.0 lb

## 2021-04-24 DIAGNOSIS — Z7951 Long term (current) use of inhaled steroids: Secondary | ICD-10-CM | POA: Insufficient documentation

## 2021-04-24 DIAGNOSIS — Z95810 Presence of automatic (implantable) cardiac defibrillator: Secondary | ICD-10-CM | POA: Diagnosis not present

## 2021-04-24 DIAGNOSIS — Z8673 Personal history of transient ischemic attack (TIA), and cerebral infarction without residual deficits: Secondary | ICD-10-CM | POA: Diagnosis not present

## 2021-04-24 DIAGNOSIS — E119 Type 2 diabetes mellitus without complications: Secondary | ICD-10-CM | POA: Diagnosis not present

## 2021-04-24 DIAGNOSIS — Z794 Long term (current) use of insulin: Secondary | ICD-10-CM | POA: Insufficient documentation

## 2021-04-24 DIAGNOSIS — I5022 Chronic systolic (congestive) heart failure: Secondary | ICD-10-CM | POA: Insufficient documentation

## 2021-04-24 DIAGNOSIS — C7931 Secondary malignant neoplasm of brain: Secondary | ICD-10-CM | POA: Insufficient documentation

## 2021-04-24 DIAGNOSIS — Z7952 Long term (current) use of systemic steroids: Secondary | ICD-10-CM | POA: Insufficient documentation

## 2021-04-24 DIAGNOSIS — I951 Orthostatic hypotension: Secondary | ICD-10-CM | POA: Diagnosis not present

## 2021-04-24 DIAGNOSIS — Z79899 Other long term (current) drug therapy: Secondary | ICD-10-CM | POA: Diagnosis not present

## 2021-04-24 DIAGNOSIS — I340 Nonrheumatic mitral (valve) insufficiency: Secondary | ICD-10-CM | POA: Diagnosis not present

## 2021-04-24 DIAGNOSIS — I255 Ischemic cardiomyopathy: Secondary | ICD-10-CM | POA: Insufficient documentation

## 2021-04-24 DIAGNOSIS — C3490 Malignant neoplasm of unspecified part of unspecified bronchus or lung: Secondary | ICD-10-CM | POA: Diagnosis not present

## 2021-04-24 DIAGNOSIS — Z86718 Personal history of other venous thrombosis and embolism: Secondary | ICD-10-CM | POA: Insufficient documentation

## 2021-04-24 DIAGNOSIS — F1910 Other psychoactive substance abuse, uncomplicated: Secondary | ICD-10-CM | POA: Insufficient documentation

## 2021-04-24 DIAGNOSIS — I2510 Atherosclerotic heart disease of native coronary artery without angina pectoris: Secondary | ICD-10-CM | POA: Diagnosis not present

## 2021-04-24 NOTE — Progress Notes (Signed)
Patient ID: Dennis [name]., male   DOB: 02/08/1961, 60 y.o.   MRN: [id number]      Advanced Heart Failure Clinic Note  HF MD: Dr Haroldine Laws PCP:  [facility] EP: Dr Lovena Le   HPI: Dennis [name]. is a 60 y.o. 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  Admitted to [facility] in 4/16 with acute CVA and with L sided weakness. CT showed R MCA stroke. Did not receive TPA due to delay in arrival. Had ECHO that showed EF 10-15% mild RV HK. No obvious LV clot. As he improved he was transferred to CIR but then developed hypotension and transferred back to HF ward. Underwent RHC on 4/27. Filling pressures mildly elevated CO ok. Diuresed gently. Meds limited by SBP in 80s. Could only tolerate ivabradine 7.5 bid, digoxin and spiro 12.5. Lasix and carvedilol stopped.   Admitted in November 2020 with CP. Troponin negative, scheduled for stress test. Had Myoview 09/21/18 which showed no ischemia, but prior infarct, noted high risk due to low EF.   In March 2021, he was diagnosed with lung cancer  + brain metastasis. Received radiation and is continuing on chemo.     Admitted in October 2021 with syncope and  falls. BP was low so HF meds stopped and he was started on midodrine. MRI of brain concerning for metastatic disease. Discharged on midodrine 10 mg three times a day + abdominal binder/ compression stockings.   He was seen by Dr. Lovena Le 09/28/20 for follow-up of his ICD which had reached ERI, though still with 3 months before EOL. Decision made for ongoing watchful waiting and if patient had a good response to chemotherapy, would consider placing another ICD at that time.  He was admitted to [facility] 1/22 after falling on ice w/ subsequent left femur fracture.  Underwent repair. Incidentally tested + for COVID 19 but no major  respiratory symptoms.   Admitted [facility] 3/22 w/ increased dyspnea, felt multifactorial from a/c CHF + COPDE. Also found to have LLE DVT, treated w/ Xarelto. Chest CT showed no PE but did showing interval enlargement of spiculated nodule c/w worsening primary malignancy + newly enlarged pretracheal and subcarinal lymph nodes, consistent with nodal metastatic disease. Echo showed severely reduced LVEF down to 15% w/ severe MR. RV moderately reduced.  Admitted 5/22 with bilateral PE and marantic endocarditis of TV lead. Felt to be thrombus. Cultures negative. Per ID, antibiotics discontinued and patient has remained clinically stable/improved. Chemotherapy stopped, ICD disabled, and discharged home with hospice.   Feeling okay, having some dyspnea on exertion still but overall stable.  No chest pain or discomfort.  Last dose of chemotherapy in May.  Taking lasix 62m daily.      Echo 2016 EF 15%.  Echo (9/17): EF 25-30%, moderate LV dilation with prominent trabeculation, normal RV size and systolic function.  Echo 08/12/17 EF 45-50%, much improved from previous  Per Dr BVaughan BrownerEcho 10/2018 LVEF 20-25%, Grade 2 DD. Echo  10/ 2021 EF  25-30% RV normal.  Echo 3/22 LVEF 15%, RV moderately reduced, severe MR  RHC 02/07/15 RA = 3 RV = 50/4/7 PA = 55/14 (34) PCW = 22 Fick cardiac output/index = 4.6/2.4 Thermodilution CO/CI = 4.8/2.5 PVR = 2.5 WU SVR = 1443 FA sat = 96% PA sat = 51%, 56%, 61%  Noninvasive Ao  pressure = 106/74 (86)   CPX 02/07/2016 Peak VO2: 18.7 (54% predicted peak VO2) VE/VCO2 slope:  30 OUES: 1.62 Peak RER:    1.13 Ventilatory Threshold: 10.9 (32% predicted or measured peak VO2)  CPX 12/24/16 Peak VO2 19.6 (58% predicted peak VO2) VE/VCO2 slope: 31 OUES: 1.79 Peak RER 1.09 Ventilatory threshold: 14.9 (44% predicted or measure peak VO2)   CPX 3/20 FVC 3.33 (76%)      FEV1 2.54 (75%)        FEV1/FVC 76 (97%)        MVV 116 (76%)   Resting HR: 94 Peak HR: 122    (74% age predicted max HR)  BP rest: 94/64 BP peak: 114/62  Peak VO2: 19.9 (60% predicted peak VO2)  VE/VCO2 slope:  31  OUES: 2.07  Peak RER: 1.09  Ventilatory Threshold: 15.3 (46% predicted or 77% measured peak VO2)  VE/MVV:  50%  O2pulse:  12   (80% predicted O2pulse)   Review of systems complete and found to be negative unless listed in HPI.    Past Medical History:  Diagnosis Date   Alcohol abuse with alcohol-induced mood disorder ([place]) 08/18/2015   Automatic implantable cardioverter-defibrillator in situ 2012   S/P St Jude Dual Chamber. ICD.   CAD (coronary artery disease) 11/16/2014   CAD Coronary angiography (12/2008) with mid LAD totally occluded and collateralized.   Cardiac LV ejection fraction 10-20%    Chest pain 09/04/2018   CHF (congestive heart failure) (HCC)    Chronic systolic heart failure ([place])    a) Mixed ICM/NICM b) RHC (05/2014): RA 2, RV 19/2/3, PA 22/14 (18), PCWP 6, Fick CO/CI: 5.2 / 2.7, PVR 2.3 WU, PA 60% and 64% c) ECHO (05/2014): EF 20-25%, diff HK, akinesis entireanteroseptal myocardium, triv AI, mod MR, LA mod/sev dilated   Cocaine abuse ([place]) 01/24/2015   Cocaine abuse with cocaine-induced mood disorder ([place]) 08/20/2015   Drug abuse and dependence ([place])    Dysphagia following cerebral infarction 11/04/4495   Embolic stroke involving right middle cerebral artery [facility])    Extensive stage primary small cell carcinoma of lung ([place]) 01/04/2020   H/O noncompliance with medical treatment, presenting hazards to health 01/24/2015   Heart murmur    High cholesterol    Ischemic cardiomyopathy    a) Coronary angiography (12/2008) at [facility]: Lmain: nl, LAD mid 100% stenosis with left to left and right-to-left collaterals to the distal LAD; Lcx: nl, RCA nl.     Left ventricular noncompaction [facility])    MDD (major depressive disorder), recurrent severe, without psychosis ([place]) 08/18/2015   Open wound of foot 02/27/2015   Pneumonia 1988   Psychoactive substance-induced mood  disorder ([place]) 07/17/2015   Sleep apnea    "cleared after T&A"   Status post PICC central line placement    Stroke [facility])    Substance induced mood disorder ([place]) 08/17/2015   Type II diabetes mellitus ([place])      Current Outpatient Medications on File Prior to Encounter  Medication Sig Dispense Refill   Accu-Chek Softclix Lancets lancets      acetaminophen (TYLENOL) 325 MG tablet Take 2 tablets (650 mg total) by mouth every 6 (six) hours as needed for mild pain (or Fever >/= 101).     atorvastatin (LIPITOR) 20 MG tablet TAKE 1 TABLET BY MOUTH DAILY AT 6 PM. (Patient taking differently: Take 20 mg by mouth daily.) 30 tablet 6   Blood Glucose Monitoring Suppl (FIFTY50 GLUCOSE METER 2.0) w/Device KIT Use as instructed  budesonide-formoterol (SYMBICORT) 160-4.5 MCG/ACT inhaler Inhale 2 puffs into the lungs 2 (two) times daily. 1 each 12   dexamethasone (DECADRON) 1 MG tablet Take 1 tablet (1 mg total) by mouth daily. 60 tablet 2   enoxaparin (LOVENOX) 80 MG/0.8ML injection Inject 0.75 mLs (75 mg total) into the skin every 12 (twelve) hours. 45 mL 0   feeding supplement (ENSURE ENLIVE / ENSURE PLUS) LIQD Take 237 mLs by mouth 2 (two) times daily between meals. (Patient taking differently: Take 237 mLs by mouth 3 (three) times daily between meals.) 237 mL 0   furosemide (LASIX) 20 MG tablet Take 20 mg by mouth daily.     HYDROcodone-acetaminophen (NORCO/VICODIN) 5-325 MG tablet Take 1-2 tablets by mouth every 4 (four) hours as needed for moderate pain. 30 tablet 0   insulin glargine (LANTUS SOLOSTAR) 100 UNIT/ML Solostar Pen Inject 25 Units into the skin daily. (Patient taking differently: Inject 25 Units into the skin 2 (two) times daily.) 15 mL 2   insulin lispro (HUMALOG KWIKPEN) 100 UNIT/ML KwikPen Inject 6 Units into the skin 3 (three) times daily. (Patient taking differently: Inject 6 Units into the skin 2 (two) times daily.) 15 mL 1   INSUPEN ULTRAFIN 31G X 8 MM MISC      methocarbamol  (ROBAXIN) 500 MG tablet Take 1 tablet (500 mg total) by mouth every 6 (six) hours as needed for muscle spasms.     midodrine (PROAMATINE) 10 MG tablet Take 1 tablet (10 mg total) by mouth 3 (three) times daily with meals. (Patient taking differently: Take 10 mg by mouth daily.) 90 tablet 2   prochlorperazine (COMPAZINE) 10 MG tablet Take 10 mg by mouth every 6 (six) hours as needed for nausea or vomiting.     senna (SENOKOT) 8.6 MG TABS tablet Take 1 tablet (8.6 mg total) by mouth 2 (two) times daily. (Patient taking differently: Take 1 tablet by mouth daily as needed for mild constipation.) 120 tablet 0   tamsulosin (FLOMAX) 0.4 MG CAPS capsule Take 0.4 mg by mouth daily.     tiotropium (SPIRIVA HANDIHALER) 18 MCG inhalation capsule Place 1 capsule (18 mcg total) into inhaler and inhale daily. 90 capsule 3   No current facility-administered medications on file prior to encounter.    No Known Allergies  Social History   Socioeconomic History   Marital status: Single    Spouse name: Not on file   Number of children: 1   Years of education: 14   Highest education level: Not on file  Occupational History   Occupation: Disabled  Tobacco Use   Smoking status: Former    Packs/day: 0.25    Years: 31.00    Pack years: 7.75    Types: Cigarettes   Smokeless tobacco: Never  Vaping Use   Vaping Use: Never used  Substance and Sexual Activity   Alcohol use: Not Currently    Alcohol/week: 0.0 standard drinks    Comment: 09/21/2014 "might have a drink q 6 /months, if that"   Drug use: Yes    Types: Cocaine    Comment: last use 07/06/2020   Sexual activity: Yes  Other Topics Concern   Not on file  Social History Narrative   Lives in [place] with his mother. No pets. Fun: movies, sports, daughter.    Denies religious beliefs that would effect health care.    Social Determinants of Health   Financial Resource Strain: Not on file  Food Insecurity: Not on file  Transportation Needs:  Not  on file  Physical Activity: Not on file  Stress: Not on file  Social Connections: Not on file  Intimate Partner Violence: Not on file            Family History  Problem Relation Age of Onset   Diabetes Mother    Heart disease Mother    Diabetes Father    Prostate cancer Father    Heart disease Father    Diabetes Brother    Diabetes Brother     Vitals:   04/24/21 1454  BP: 96/72  Pulse: (!) 105  SpO2: 98%  Weight: 78.9 kg (174 lb)  Height: 6' (1.829 m)     Wt Readings from Last 3 Encounters:  04/24/21 78.9 kg (174 lb)  02/27/21 75.1 kg (165 lb 9.1 oz)  02/05/21 79.6 kg (175 lb 6.4 oz)     PHYSICAL EXAM: General:  Chronically ill appearing, fail AAM in wheel chair on supp O2 but no visible No respiratory difficulty General:  Well appearing. No resp difficulty HEENT: normal Neck: supple. no JVD. Carotids 2+ bilat; no bruits. No lymphadenopathy or thryomegaly appreciated. Cor: PMI nondisplaced. Regular rate & rhythm. 3/6 MR Lungs: clear Abdomen: soft, nontender, nondistended. No hepatosplenomegaly. No bruits or masses. Good bowel sounds. Extremities: no cyanosis, clubbing, rash, edema Neuro: alert & orientedx3, cranial nerves grossly intact. moves all 4 extremities w/o difficulty. Affect pleasant   ASSESSMENT & PLAN: 1. Chronic Systolic Heart Failure-Mixed NICM/ICM .  - S/P St Jude Dual Chamber. ICD. - Echo 09/05/2018 LVEF 20-25%.  - Echo 11/02/2018: EF remains low at 20-25%  - Echo 07/2020 EF 25-30%  - CPX 12/2018. Moderate HF limitation.  - Echo 3/22 EF 15%, RV moderately reduced. Severe MR - Orthostatic Hypotension has also limited medical therapy, currently only on loop diuretic, still on midodrine - Overall stable. NYHA Class III, confounded by his marked comorbidities. Now on Hospice for progressive/metastatic CA. ICD is off - Volume status ok  - Continue Lasix 20 mg daily  - Continue midodrine 10 mg three times a day for BP support   2. Severe MR - noted  on echo 3/22  - likely functional from severe LV dysfunction  - NYHA Class III (stable)  - continue diuretics for volume control  - prognosis is poor from lung cancer, no plans for MitraClip   3. Metastatic Lung Cancer w/ Brain Mets  - Received radiation + chemo - Unfortunately had recent f/u chest CT 3/22 showing interval enlargement of spiculated nodule c/w worsening primary malignancy + newly enlarged pretracheal and subcarinal lymph nodes, consistent with nodal metastatic disease.  - followed by Dr. Julien Nordmann.  - chemo recently stopped. Now on Hospice  4. LLE DVT - newly diagnosed 3/22. Chest CT negative for PE - suspect due to hypercoagulable state from underlying malignancy - on Xarelto. Denies abnormal bleeding  - Brother is giving him lovenox shots   5. CAD  - Coronary angiography (12/2008) with mid LAD totally occluded and collateralized - Stress test 09/21/18 with no ischemia and prior infarct.  - No s/s angina  - Continue atorvastatin 20 mg daily.  - No ASA with stable CAD and use of Xarelto   6. DMII - Per PCP - Off jardiance   7.  Hx of embolic CVA - previously on coumadin, now on Xarelto  - No bleeding issues.   8. Polysubstance abuse  - Sober since 09/17/2015. Attends NA regularly and lives with his sponsor.   9. Left Femoral  Fx - 1/22 after fall on ice - s/p IMN    Glori Bickers, MD  8:20 PM

## 2021-04-24 NOTE — Patient Instructions (Signed)
Your physician recommends that you schedule a follow-up appointment in: 4 months  If you have any questions or concerns before your next appointment please send [place] a message through [employer] or call our office at [phone].    TO LEAVE A MESSAGE FOR THE NURSE SELECT OPTION 2, PLEASE LEAVE A MESSAGE INCLUDING: YOUR NAME DATE OF BIRTH CALL BACK NUMBER REASON FOR CALL**this is important as we prioritize the call backs  YOU WILL RECEIVE A CALL BACK THE SAME DAY AS LONG AS YOU CALL BEFORE 4:00 PM  milAt the [facility], you and your health needs are our priority. As part of our continuing mission to provide you with exceptional heart care, we have created designated Provider Care Teams. These Care Teams include your primary Cardiologist (physician) and Advanced Practice Providers (APPs- Physician Assistants and Nurse Practitioners) who all work together to provide you with the care you need, when you need it.   You may see any of the following providers on your designated Care Team at your next follow up: Dr Glori Bickers Dr Loralie Champagne Dr Patrice Paradise, NP Lyda Jester, [place] [name] Audry Riles, PharmD   Please be sure to bring in all your medications bottles to every appointment.

## 2021-05-01 ENCOUNTER — Other Ambulatory Visit: Payer: Self-pay | Admitting: Internal Medicine

## 2021-05-01 DIAGNOSIS — I82432 Acute embolism and thrombosis of left popliteal vein: Secondary | ICD-10-CM

## 2021-05-01 DIAGNOSIS — I2699 Other pulmonary embolism without acute cor pulmonale: Secondary | ICD-10-CM

## 2021-05-09 ENCOUNTER — Telehealth: Payer: Self-pay | Admitting: Medical Oncology

## 2021-05-09 NOTE — Telephone Encounter (Signed)
Lovenox- RN @ hospice said [facility] wants to know if he can stop his Lovenox ?  If not ,how long does he have to be on it?  "Pt complains that it makes his stomach hurt".  Mohamed to advise.

## 2021-05-13 ENCOUNTER — Encounter: Payer: Self-pay | Admitting: Internal Medicine

## 2021-05-13 NOTE — Telephone Encounter (Signed)
Hospice notified.

## 2021-05-13 NOTE — Telephone Encounter (Signed)
Dennis Bears, MD  [facility] Pod 3 3 days ago     Yes. They can stop Lovenox since he is on hospice.

## 2021-06-05 ENCOUNTER — Telehealth: Payer: Self-pay | Admitting: Medical Oncology

## 2021-06-05 NOTE — Telephone Encounter (Signed)
Pt has

## 2021-06-05 NOTE — Telephone Encounter (Signed)
Update on pt - Dennis [name] has developed swelling all over his body.

## 2021-08-02 ENCOUNTER — Other Ambulatory Visit: Payer: Self-pay

## 2021-08-02 ENCOUNTER — Emergency Department (HOSPITAL_COMMUNITY)

## 2021-08-02 ENCOUNTER — Encounter: Payer: Self-pay | Admitting: Internal Medicine

## 2021-08-02 ENCOUNTER — Inpatient Hospital Stay (HOSPITAL_COMMUNITY)
Admission: EM | Admit: 2021-08-02 | Discharge: 2021-08-13 | DRG: 481 | Disposition: E | Attending: Internal Medicine | Admitting: Internal Medicine

## 2021-08-02 DIAGNOSIS — Z8616 Personal history of COVID-19: Secondary | ICD-10-CM

## 2021-08-02 DIAGNOSIS — Y9301 Activity, walking, marching and hiking: Secondary | ICD-10-CM | POA: Diagnosis present

## 2021-08-02 DIAGNOSIS — E1165 Type 2 diabetes mellitus with hyperglycemia: Secondary | ICD-10-CM

## 2021-08-02 DIAGNOSIS — E7800 Pure hypercholesterolemia, unspecified: Secondary | ICD-10-CM | POA: Diagnosis present

## 2021-08-02 DIAGNOSIS — N400 Enlarged prostate without lower urinary tract symptoms: Secondary | ICD-10-CM | POA: Diagnosis not present

## 2021-08-02 DIAGNOSIS — J449 Chronic obstructive pulmonary disease, unspecified: Secondary | ICD-10-CM | POA: Diagnosis present

## 2021-08-02 DIAGNOSIS — S72402A Unspecified fracture of lower end of left femur, initial encounter for closed fracture: Secondary | ICD-10-CM | POA: Diagnosis not present

## 2021-08-02 DIAGNOSIS — Z419 Encounter for procedure for purposes other than remedying health state, unspecified: Secondary | ICD-10-CM

## 2021-08-02 DIAGNOSIS — R17 Unspecified jaundice: Secondary | ICD-10-CM | POA: Diagnosis present

## 2021-08-02 DIAGNOSIS — S72145A Nondisplaced intertrochanteric fracture of left femur, initial encounter for closed fracture: Principal | ICD-10-CM | POA: Diagnosis present

## 2021-08-02 DIAGNOSIS — Z79899 Other long term (current) drug therapy: Secondary | ICD-10-CM

## 2021-08-02 DIAGNOSIS — C7931 Secondary malignant neoplasm of brain: Secondary | ICD-10-CM | POA: Diagnosis present

## 2021-08-02 DIAGNOSIS — Z87891 Personal history of nicotine dependence: Secondary | ICD-10-CM

## 2021-08-02 DIAGNOSIS — Z86718 Personal history of other venous thrombosis and embolism: Secondary | ICD-10-CM

## 2021-08-02 DIAGNOSIS — Z66 Do not resuscitate: Secondary | ICD-10-CM | POA: Diagnosis present

## 2021-08-02 DIAGNOSIS — I69391 Dysphagia following cerebral infarction: Secondary | ICD-10-CM

## 2021-08-02 DIAGNOSIS — Z515 Encounter for palliative care: Secondary | ICD-10-CM

## 2021-08-02 DIAGNOSIS — I5042 Chronic combined systolic (congestive) and diastolic (congestive) heart failure: Secondary | ICD-10-CM | POA: Diagnosis not present

## 2021-08-02 DIAGNOSIS — Z7189 Other specified counseling: Secondary | ICD-10-CM

## 2021-08-02 DIAGNOSIS — Z794 Long term (current) use of insulin: Secondary | ICD-10-CM

## 2021-08-02 DIAGNOSIS — C3490 Malignant neoplasm of unspecified part of unspecified bronchus or lung: Secondary | ICD-10-CM | POA: Diagnosis present

## 2021-08-02 DIAGNOSIS — Z8249 Family history of ischemic heart disease and other diseases of the circulatory system: Secondary | ICD-10-CM

## 2021-08-02 DIAGNOSIS — I130 Hypertensive heart and chronic kidney disease with heart failure and stage 1 through stage 4 chronic kidney disease, or unspecified chronic kidney disease: Secondary | ICD-10-CM | POA: Diagnosis present

## 2021-08-02 DIAGNOSIS — Z7951 Long term (current) use of inhaled steroids: Secondary | ICD-10-CM

## 2021-08-02 DIAGNOSIS — I340 Nonrheumatic mitral (valve) insufficiency: Secondary | ICD-10-CM | POA: Diagnosis present

## 2021-08-02 DIAGNOSIS — M9702XA Periprosthetic fracture around internal prosthetic left hip joint, initial encounter: Secondary | ICD-10-CM | POA: Diagnosis present

## 2021-08-02 DIAGNOSIS — Z923 Personal history of irradiation: Secondary | ICD-10-CM

## 2021-08-02 DIAGNOSIS — Z95810 Presence of automatic (implantable) cardiac defibrillator: Secondary | ICD-10-CM

## 2021-08-02 DIAGNOSIS — E1169 Type 2 diabetes mellitus with other specified complication: Secondary | ICD-10-CM | POA: Diagnosis present

## 2021-08-02 DIAGNOSIS — Z9221 Personal history of antineoplastic chemotherapy: Secondary | ICD-10-CM

## 2021-08-02 DIAGNOSIS — N1831 Chronic kidney disease, stage 3a: Secondary | ICD-10-CM | POA: Diagnosis present

## 2021-08-02 DIAGNOSIS — I255 Ischemic cardiomyopathy: Secondary | ICD-10-CM | POA: Diagnosis present

## 2021-08-02 DIAGNOSIS — Y92003 Bedroom of unspecified non-institutional (private) residence as the place of occurrence of the external cause: Secondary | ICD-10-CM

## 2021-08-02 DIAGNOSIS — I428 Other cardiomyopathies: Secondary | ICD-10-CM | POA: Diagnosis present

## 2021-08-02 DIAGNOSIS — F1410 Cocaine abuse, uncomplicated: Secondary | ICD-10-CM | POA: Diagnosis present

## 2021-08-02 DIAGNOSIS — E871 Hypo-osmolality and hyponatremia: Secondary | ICD-10-CM | POA: Diagnosis not present

## 2021-08-02 DIAGNOSIS — Z8042 Family history of malignant neoplasm of prostate: Secondary | ICD-10-CM

## 2021-08-02 DIAGNOSIS — I2510 Atherosclerotic heart disease of native coronary artery without angina pectoris: Secondary | ICD-10-CM | POA: Diagnosis present

## 2021-08-02 DIAGNOSIS — E785 Hyperlipidemia, unspecified: Secondary | ICD-10-CM

## 2021-08-02 DIAGNOSIS — E1122 Type 2 diabetes mellitus with diabetic chronic kidney disease: Secondary | ICD-10-CM | POA: Diagnosis present

## 2021-08-02 DIAGNOSIS — Z91199 Patient's noncompliance with other medical treatment and regimen due to unspecified reason: Secondary | ICD-10-CM

## 2021-08-02 DIAGNOSIS — I959 Hypotension, unspecified: Secondary | ICD-10-CM | POA: Diagnosis present

## 2021-08-02 DIAGNOSIS — R092 Respiratory arrest: Secondary | ICD-10-CM | POA: Diagnosis not present

## 2021-08-02 DIAGNOSIS — C3491 Malignant neoplasm of unspecified part of right bronchus or lung: Secondary | ICD-10-CM | POA: Diagnosis present

## 2021-08-02 DIAGNOSIS — I509 Heart failure, unspecified: Secondary | ICD-10-CM

## 2021-08-02 DIAGNOSIS — Z9049 Acquired absence of other specified parts of digestive tract: Secondary | ICD-10-CM

## 2021-08-02 DIAGNOSIS — D6489 Other specified anemias: Secondary | ICD-10-CM | POA: Diagnosis present

## 2021-08-02 DIAGNOSIS — Z86711 Personal history of pulmonary embolism: Secondary | ICD-10-CM | POA: Diagnosis present

## 2021-08-02 DIAGNOSIS — Y92002 Bathroom of unspecified non-institutional (private) residence single-family (private) house as the place of occurrence of the external cause: Secondary | ICD-10-CM

## 2021-08-02 DIAGNOSIS — W19XXXA Unspecified fall, initial encounter: Secondary | ICD-10-CM

## 2021-08-02 DIAGNOSIS — W010XXA Fall on same level from slipping, tripping and stumbling without subsequent striking against object, initial encounter: Secondary | ICD-10-CM | POA: Diagnosis present

## 2021-08-02 DIAGNOSIS — E8809 Other disorders of plasma-protein metabolism, not elsewhere classified: Secondary | ICD-10-CM | POA: Diagnosis present

## 2021-08-02 DIAGNOSIS — Z7901 Long term (current) use of anticoagulants: Secondary | ICD-10-CM

## 2021-08-02 DIAGNOSIS — Z833 Family history of diabetes mellitus: Secondary | ICD-10-CM

## 2021-08-02 DIAGNOSIS — M25552 Pain in left hip: Secondary | ICD-10-CM | POA: Diagnosis not present

## 2021-08-02 LAB — RESP PANEL BY RT-PCR (FLU A&B, COVID) ARPGX2
Influenza A by PCR: NEGATIVE
Influenza B by PCR: NEGATIVE
SARS Coronavirus 2 by RT PCR: NEGATIVE

## 2021-08-02 LAB — SAMPLE TO BLOOD BANK

## 2021-08-02 LAB — CBC WITH DIFFERENTIAL/PLATELET
Abs Immature Granulocytes: 0.07 10*3/uL (ref 0.00–0.07)
Basophils Absolute: 0 10*3/uL (ref 0.0–0.1)
Basophils Relative: 0 %
Eosinophils Absolute: 0 10*3/uL (ref 0.0–0.5)
Eosinophils Relative: 0 %
HCT: 39.5 % (ref 39.0–52.0)
Hemoglobin: 12.1 g/dL — ABNORMAL LOW (ref 13.0–17.0)
Immature Granulocytes: 1 %
Lymphocytes Relative: 8 %
Lymphs Abs: 1.2 10*3/uL (ref 0.7–4.0)
MCH: 29.1 pg (ref 26.0–34.0)
MCHC: 30.6 g/dL (ref 30.0–36.0)
MCV: 95 fL (ref 80.0–100.0)
Monocytes Absolute: 1.3 10*3/uL — ABNORMAL HIGH (ref 0.1–1.0)
Monocytes Relative: 9 %
Neutro Abs: 11.8 10*3/uL — ABNORMAL HIGH (ref 1.7–7.7)
Neutrophils Relative %: 82 %
Platelets: 134 10*3/uL — ABNORMAL LOW (ref 150–400)
RBC: 4.16 MIL/uL — ABNORMAL LOW (ref 4.22–5.81)
RDW: 16.5 % — ABNORMAL HIGH (ref 11.5–15.5)
WBC: 14.3 10*3/uL — ABNORMAL HIGH (ref 4.0–10.5)
nRBC: 0.1 % (ref 0.0–0.2)

## 2021-08-02 LAB — COMPREHENSIVE METABOLIC PANEL
ALT: 14 U/L (ref 0–44)
AST: 17 U/L (ref 15–41)
Albumin: 2.7 g/dL — ABNORMAL LOW (ref 3.5–5.0)
Alkaline Phosphatase: 118 U/L (ref 38–126)
Anion gap: 11 (ref 5–15)
BUN: 19 mg/dL (ref 6–20)
CO2: 22 mmol/L (ref 22–32)
Calcium: 8.7 mg/dL — ABNORMAL LOW (ref 8.9–10.3)
Chloride: 103 mmol/L (ref 98–111)
Creatinine, Ser: 1.62 mg/dL — ABNORMAL HIGH (ref 0.61–1.24)
GFR, Estimated: 49 mL/min — ABNORMAL LOW (ref >60)
Glucose, Bld: 262 mg/dL — ABNORMAL HIGH (ref 70–99)
Potassium: 4.7 mmol/L (ref 3.5–5.1)
Sodium: 136 mmol/L (ref 135–145)
Total Bilirubin: 2.3 mg/dL — ABNORMAL HIGH (ref 0.3–1.2)
Total Protein: 6 g/dL — ABNORMAL LOW (ref 6.5–8.1)

## 2021-08-02 LAB — PROTIME-INR
INR: 1.3 — ABNORMAL HIGH (ref 0.8–1.2)
Prothrombin Time: 16.2 seconds — ABNORMAL HIGH (ref 11.4–15.2)

## 2021-08-02 IMAGING — DX DG FEMUR 2+V*L*
4 series · 4 of 4 positions shown · non-contrast
Comparison: Radiographs [DATE].  Pelvic CT [DATE].

CLINICAL DATA: Patient tripped and fell this morning. Left thigh
injury with inability to bear weight.

EXAM:
LEFT FEMUR 2 VIEWS

[t femur proximal lat left]
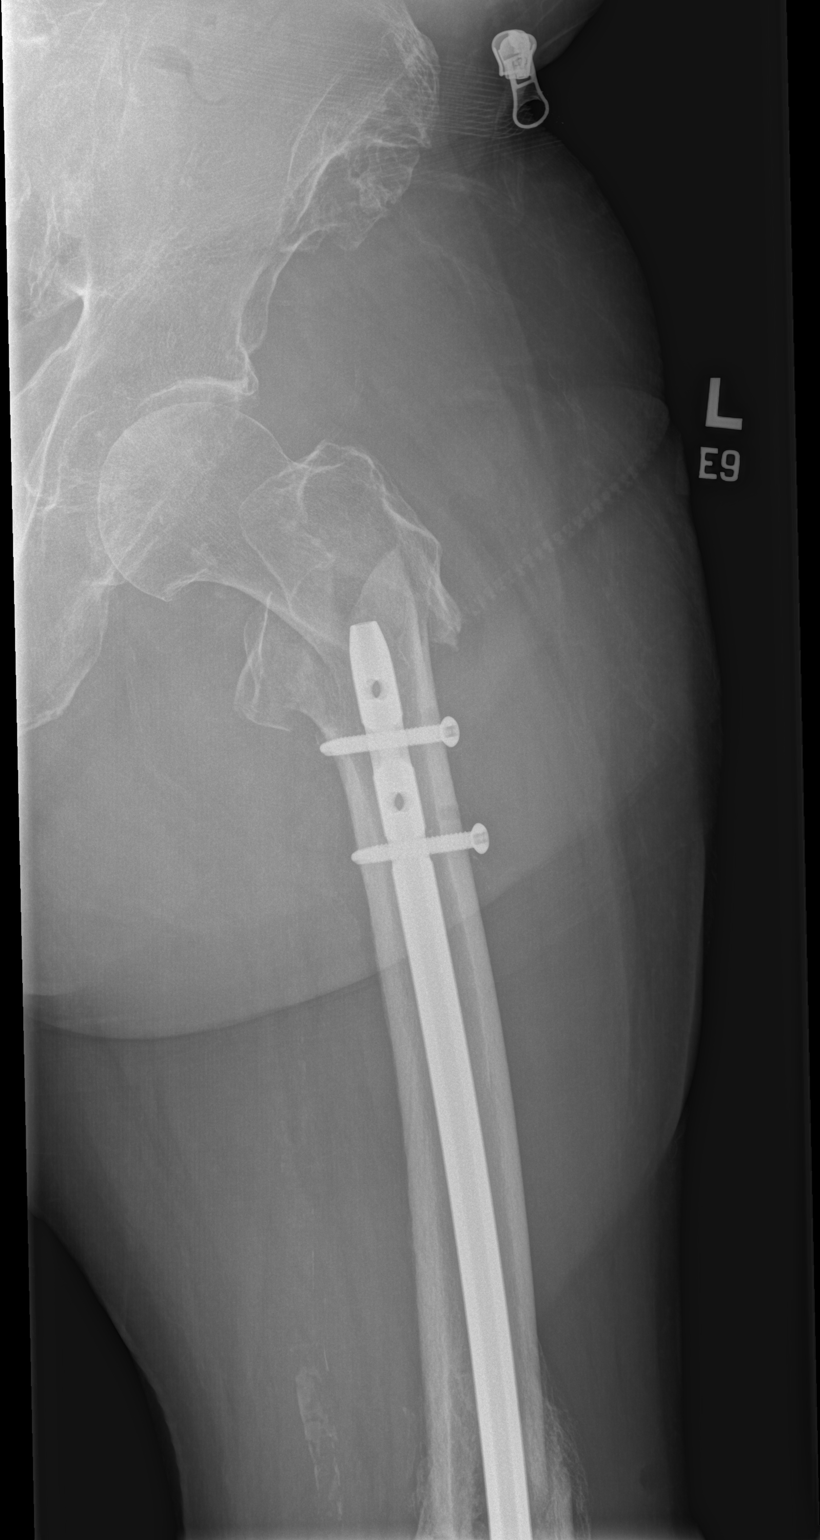

[t femur distal lat left]
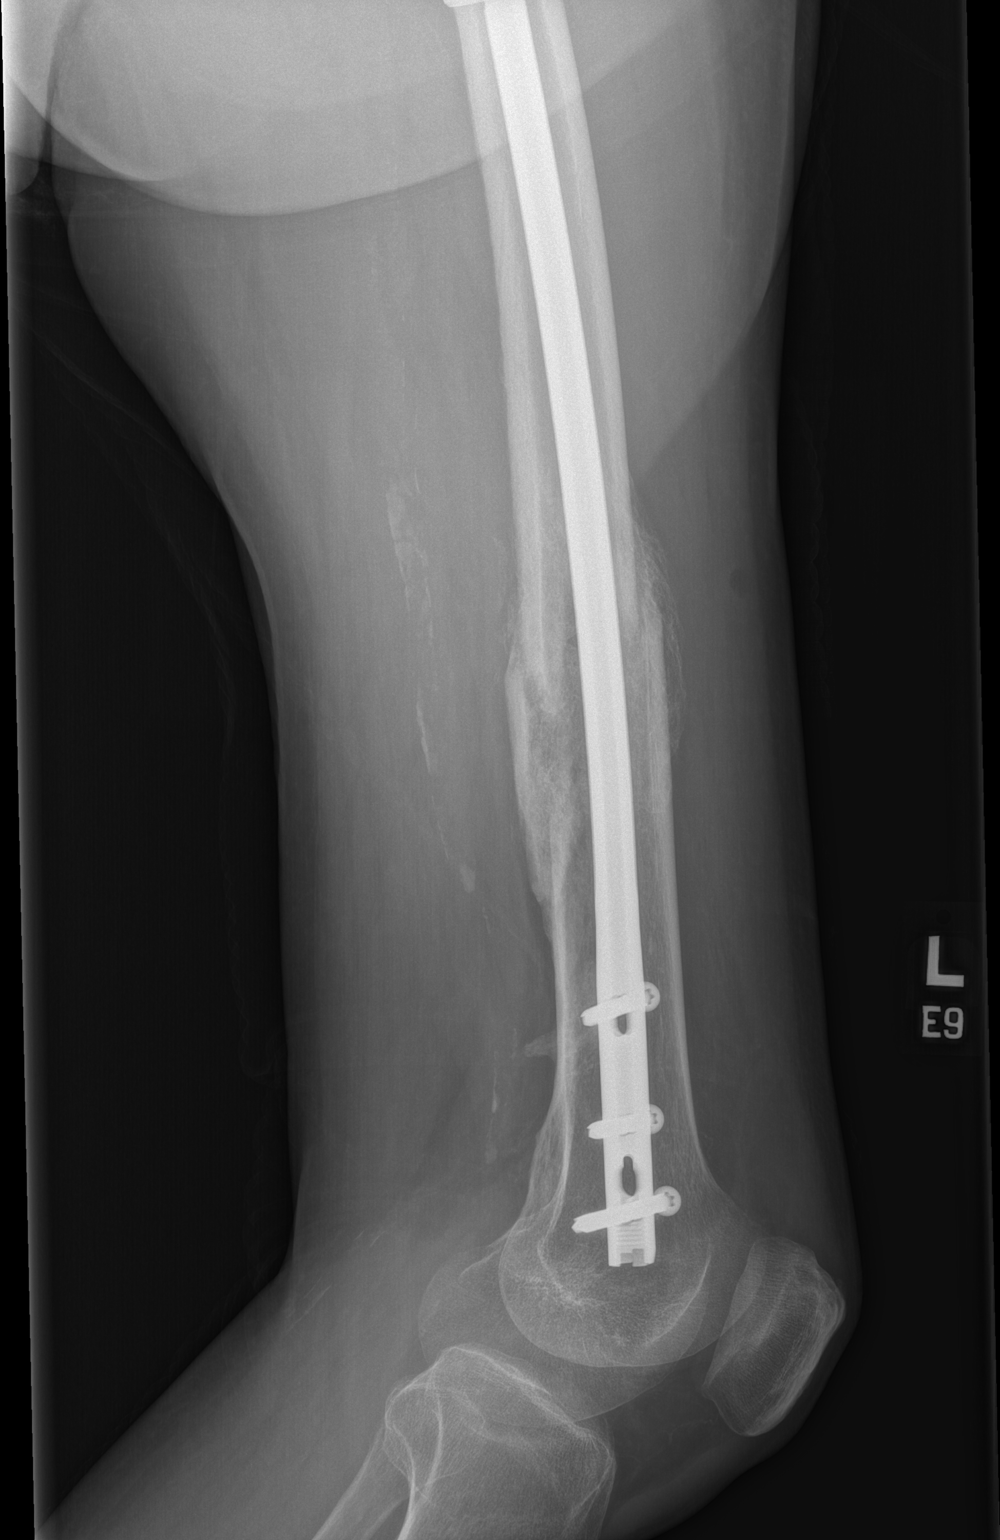

[t femur distal ap left]
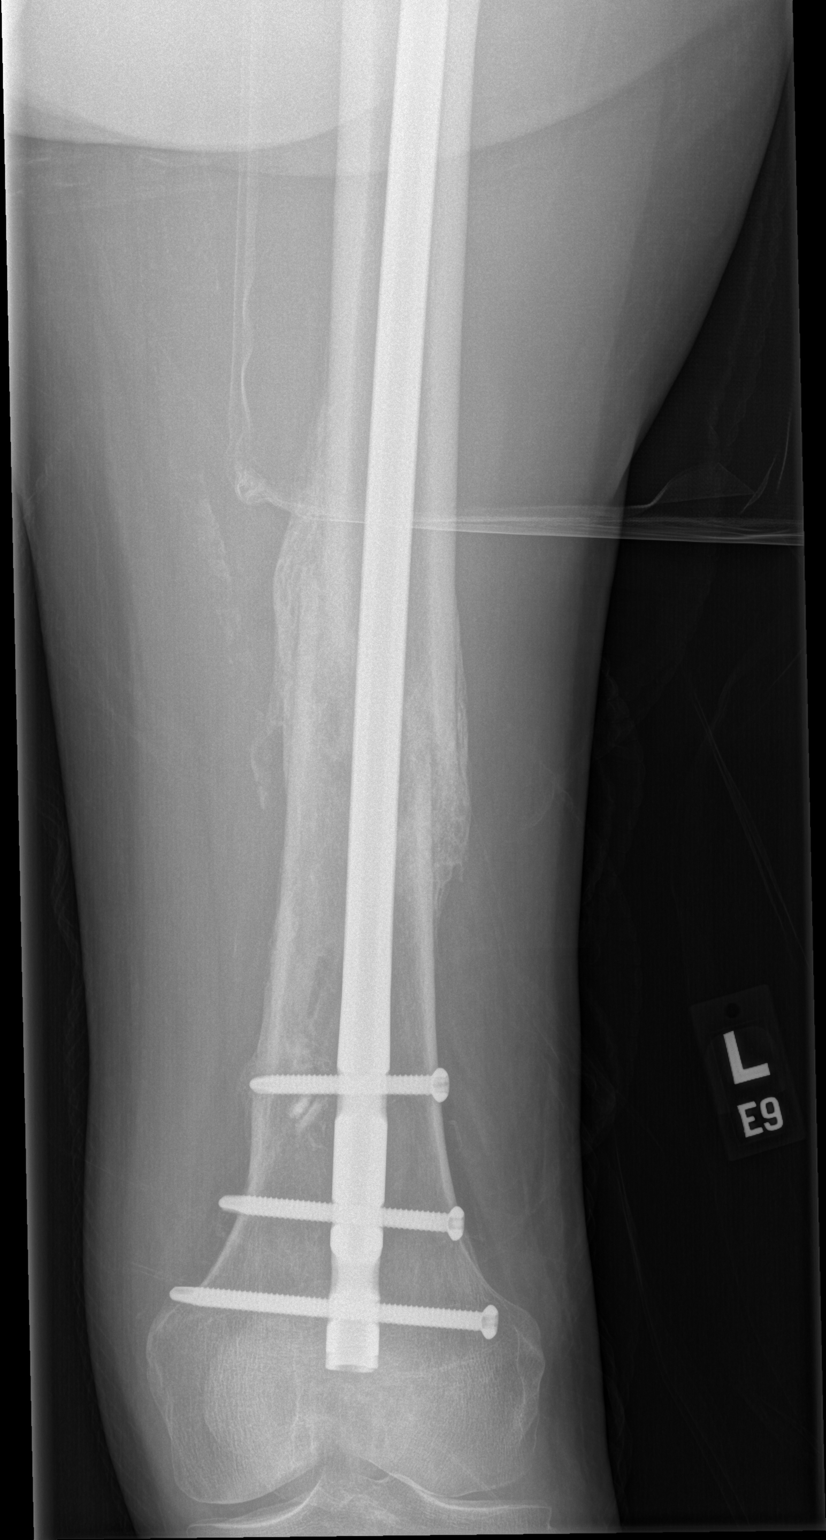

[t femur proximal ap left]
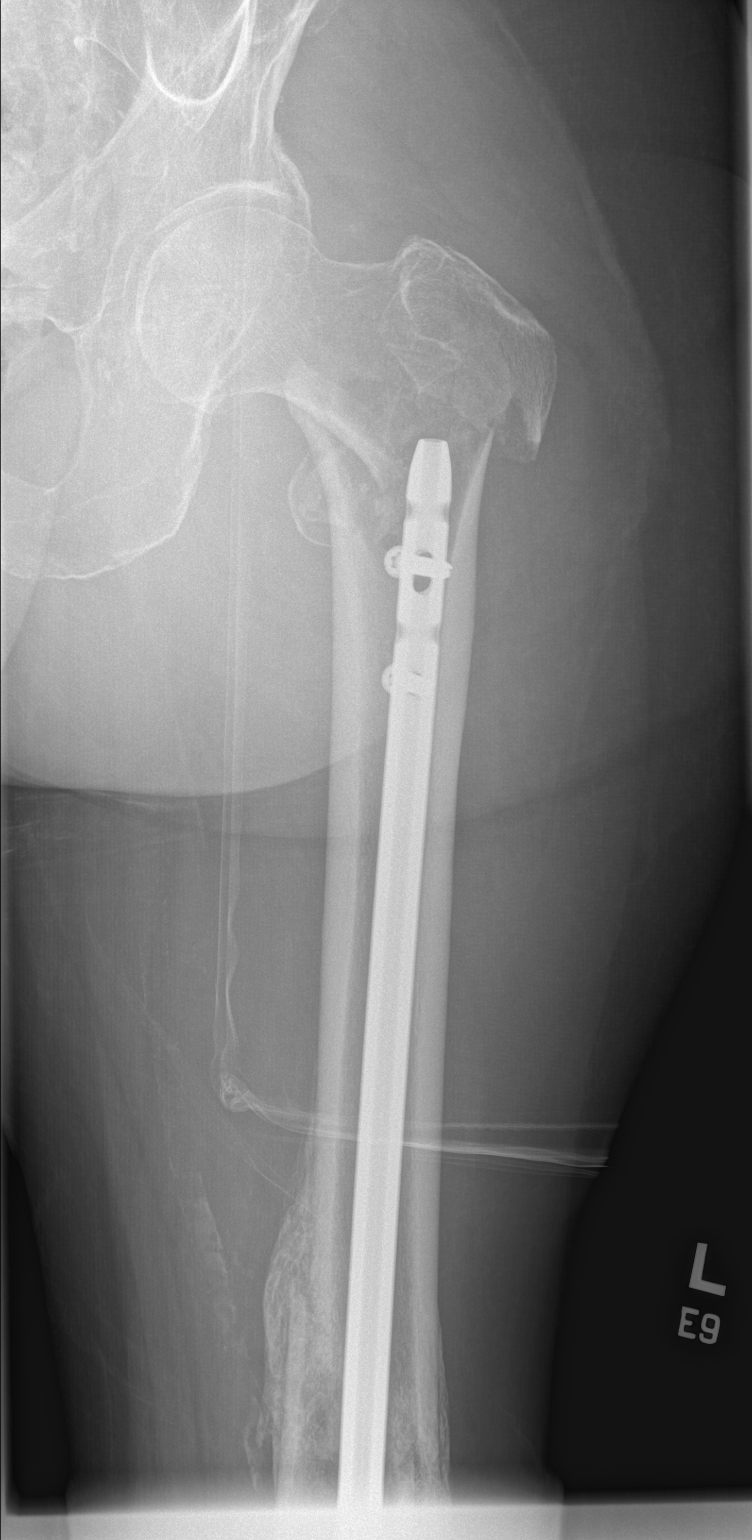

[4 of 4 positions shown; findings below may reference images not displayed]

FINDINGS: Status post left femoral intramedullary nail fixation with intact
hardware and no screw loosening. The previously demonstrated distal
diaphyseal femur fracture has healed with moderate posttraumatic
deformity. There is a new acute, mildly comminuted intertrochanteric
femur fracture. The femoral head is intact and located. The
visualized left lower hemipelvis appears intact.
IMPRESSION: 1. Comminuted intertrochanteric left femur fracture just above the
femoral intramedullary nail.
2. Interval healing of distal diaphyseal femur fracture.

## 2021-08-02 MED ORDER — MORPHINE SULFATE (PF) 4 MG/ML IV SOLN
4.0000 mg | Freq: Once | INTRAVENOUS | Status: AC
  Administered 2021-08-02: 4 mg via INTRAVENOUS
  Filled 2021-08-02: qty 1

## 2021-08-02 MED ORDER — CHLORHEXIDINE GLUCONATE CLOTH 2 % EX PADS
6.0000 | MEDICATED_PAD | Freq: Every day | CUTANEOUS | Status: DC
Start: 2021-08-02 — End: 2021-08-05
  Administered 2021-08-05: 6 via TOPICAL

## 2021-08-02 MED ORDER — SODIUM CHLORIDE 0.9% FLUSH
10.0000 mL | Freq: Two times a day (BID) | INTRAVENOUS | Status: DC
  Administered 2021-08-03 – 2021-08-04 (×3): 10 mL

## 2021-08-02 MED ORDER — SODIUM CHLORIDE 0.9% FLUSH: 10.0000 mL | INTRAVENOUS | Status: DC | PRN

## 2021-08-02 NOTE — ED Provider Notes (Signed)
[facility] EMERGENCY DEPARTMENT Provider Note   CSN: [id number] Arrival date & time: 08/11/2021  1528     History Chief Complaint  Patient presents with   Fall    Dennis [name]. is a 60 y.o. male.  HPI This is a 60 year old male with extensive past medical history including metastatic small cell lung cancer, CHF last EF 15%, ICD, hypertension, stroke, hyperlipidemia, diabetes, prior DVT and PE who presents after a fall.  Patient reports she is walking with a walker and reached for last water and slipped and fell on his left side.  Reports pain to his left hip described as sharp, 10 out of 10, nonradiating.  Patient is unable to ambulate.  He denies any chest pain, shortness of breath, or palpitations prior to his fall.  Patient is on palliative care secondary to his severe CHF and metastatic cancer.     Past Medical History:  Diagnosis Date   Alcohol abuse with alcohol-induced mood disorder ([place]) 08/18/2015   Automatic implantable cardioverter-defibrillator in situ 2012   S/P St Jude Dual Chamber. ICD.   CAD (coronary artery disease) 11/16/2014   CAD Coronary angiography (12/2008) with mid LAD totally occluded and collateralized.   Cardiac LV ejection fraction 10-20%    Chest pain 09/04/2018   CHF (congestive heart failure) (HCC)    Chronic systolic heart failure ([place])    a) Mixed ICM/NICM b) RHC (05/2014): RA 2, RV 19/2/3, PA 22/14 (18), PCWP 6, Fick CO/CI: 5.2 / 2.7, PVR 2.3 WU, PA 60% and 64% c) ECHO (05/2014): EF 20-25%, diff HK, akinesis entireanteroseptal myocardium, triv AI, mod MR, LA mod/sev dilated   Cocaine abuse ([place]) 01/24/2015   Cocaine abuse with cocaine-induced mood disorder ([place]) 08/20/2015   Drug abuse and dependence ([place])    Dysphagia following cerebral infarction 7/61/6073   Embolic stroke involving right middle cerebral artery [facility])    Extensive stage primary small cell carcinoma of lung ([place]) 01/04/2020   H/O noncompliance with medical  treatment, presenting hazards to health 01/24/2015   Heart murmur    High cholesterol    Ischemic cardiomyopathy    a) Coronary angiography (12/2008) at [facility]: [place]: nl, LAD mid 100% stenosis with left to left and right-to-left collaterals to the distal LAD; Lcx: nl, RCA nl.     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[place])    MDD (major depressive disorder), recurrent severe, without psychosis ([place]) 08/18/2015   Open wound of foot 02/27/2015   Pneumonia 1988   Psychoactive substance-induced mood disorder ([place]) 07/17/2015   Sleep apnea    "cleared after T&A"   Status post PICC central line placement    Stroke [facility])    Substance induced mood disorder ([place]) 08/17/2015   Type II diabetes mellitus ([place])     Patient Active Problem List   Diagnosis Date Noted   Closed fracture of distal end of left femur, unspecified fracture morphology, initial encounter ([place]) 08/12/2021   Hospice care patient 02/27/2021   Acute endocarditis    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   Pulmonary embolism ([place]) 02/22/2021   Port-A-Cath in place 02/05/2021   Hypokalemia 01/16/2021   Acute exacerbation of CHF (congestive heart failure) ([place]) 12/25/2020   Femoral distal fracture ([place]) 10/30/2020   Fall 10/30/2020   COVID-19 virus infection 10/30/2020   Brain metastases ([place]) 08/27/2020   Orthostatic hypotension    Syncope 07/09/2020   Slurred speech 07/08/2020   Uncontrolled type 2 diabetes mellitus with hyperglycemia, with long-term current use of insulin ([place]) 07/08/2020  Unsteady gait when walking 07/07/2020   Small cell lung cancer, right upper lobe ([place]) 01/19/2020   Encounter for antineoplastic chemotherapy 01/19/2020   Encounter for antineoplastic immunotherapy 01/19/2020   Extensive stage primary small cell carcinoma of lung ([place]) 01/04/2020   Goals of care, counseling/discussion 01/04/2020   Mediastinal adenopathy    Mass of upper lobe of right lung    Brain mass 12/23/2019   Vasogenic cerebral edema ([place])  12/23/2019   Left leg paresthesias    History of CVA with residual deficit    Acute on chronic systolic congestive heart failure (HCC)    Left-sided weakness    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[place])    Cocaine abuse ([place]) 01/24/2015   Essential hypertension 01/24/2015   Dysphagia 01/24/2015   Left hemiplegia ([place]) 01/24/2015   Hyperlipidemia associated with type 2 diabetes mellitus ([place]) 58/85/0277   Chronic systolic CHF (congestive heart failure) ([place]) 06/20/2014   Automatic implantable cardioverter-defibrillator in situ    Drug abuse and dependence ([place])    Cardiomyopathy, ischemic 07/21/2011   Type II diabetes mellitus with ophthalmic manifestations ([place]) 09/13/2007    Past Surgical History:  Procedure Laterality Date   BRONCHIAL BRUSHINGS  12/28/2019   Procedure: BRONCHIAL BRUSHINGS;  Surgeon: Garner Nash, DO;  Location: [place];  Service: Thoracic;;   BRONCHIAL NEEDLE ASPIRATION BIOPSY  12/28/2019   Procedure: BRONCHIAL NEEDLE ASPIRATION BIOPSIES;  Surgeon: Garner Nash, DO;  Location: [place];  Service: Thoracic;;   BRONCHIAL WASHINGS  12/28/2019   Procedure: BRONCHIAL WASHINGS;  Surgeon: Garner Nash, DO;  Location: [place];  Service: Thoracic;;   CARDIAC CATHETERIZATION  05/2014   CARDIAC DEFIBRILLATOR PLACEMENT  03/2011   CHOLECYSTECTOMY  1994   FEMUR IM NAIL Left 10/31/2020   Procedure: INTRAMEDULLARY (IM) RETROGRADE FEMORAL NAILING;  Surgeon: Shona Needles, MD;  Location: [place];  Service: Orthopedics;  Laterality: Left;   FOREARM FRACTURE SURGERY Left 1980   FRACTURE SURGERY     IR IMAGING GUIDED PORT INSERTION  10/11/2020   RIGHT HEART CATHETERIZATION N/A 05/30/2014   Procedure: RIGHT HEART CATH;  Surgeon: Jolaine Artist, MD;  Location: [facility] CATH LAB;  Service: Cardiovascular;  Laterality: N/A;   RIGHT HEART CATHETERIZATION N/A 02/07/2015   Procedure: RIGHT HEART CATH;  Surgeon: Jolaine Artist, MD;  Location: [facility] CATH LAB;  Service:  Cardiovascular;  Laterality: N/A;   TONSILLECTOMY AND ADENOIDECTOMY  ~ 1998   VIDEO BRONCHOSCOPY WITH ENDOBRONCHIAL ULTRASOUND N/A 12/28/2019   Procedure: VIDEO BRONCHOSCOPY WITH ENDOBRONCHIAL ULTRASOUND;  Surgeon: Garner Nash, DO;  Location: [facility] ENDOSCOPY;  Service: Thoracic;  Laterality: N/A;       Family History  Problem Relation Age of Onset   Diabetes Mother    Heart disease Mother    Diabetes Father    Prostate cancer Father    Heart disease Father    Diabetes Brother    Diabetes Brother     Social History   Tobacco Use   Smoking status: Former    Packs/day: 0.25    Years: 31.00    Pack years: 7.75    Types: Cigarettes   Smokeless tobacco: Never  Vaping Use   Vaping Use: Never used  Substance Use Topics   Alcohol use: Not Currently    Alcohol/week: 0.0 standard drinks    Comment: 09/21/2014 "might have a drink q 6 /months, if that"   Drug use: Yes    Types: Cocaine    Comment: last use 07/06/2020  Home Medications Prior to Admission medications   Medication Sig Start Date End Date Taking? Authorizing Provider  Accu-Chek Softclix Lancets lancets  02/03/20   [provider]  acetaminophen (TYLENOL) 325 MG tablet Take 2 tablets (650 mg total) by mouth every 6 (six) hours as needed for mild pain (or Fever >/= 101). 02/27/21   Ezekiel Slocumb, DO  atorvastatin (LIPITOR) 20 MG tablet TAKE 1 TABLET BY MOUTH DAILY AT 6 PM. Patient taking differently: Take 20 mg by mouth daily. 03/11/17   Bensimhon, Shaune Pascal, MD  Blood Glucose Monitoring Suppl (FIFTY50 GLUCOSE METER 2.0) w/Device KIT Use as instructed 02/03/20   [provider]  budesonide-formoterol (SYMBICORT) 160-4.5 MCG/ACT inhaler Inhale 2 puffs into the lungs 2 (two) times daily. 12/29/20   [place] Eric J, DO  dexamethasone (DECADRON) 1 MG tablet Take 1 tablet (1 mg total) by mouth daily. 12/18/20   Vaslow, Acey Lav, MD  enoxaparin (LOVENOX) 80 MG/0.8ML injection Inject 0.75 mLs (75 mg total)  into the skin every 12 (twelve) hours.**Pt must waste 0.5 mLs before injecting.** 05/01/21   Curt Bears, MD  feeding supplement (ENSURE ENLIVE / ENSURE PLUS) LIQD Take 237 mLs by mouth 2 (two) times daily between meals. Patient taking differently: Take 237 mLs by mouth 3 (three) times daily between meals. 07/26/20   Eugenie Filler, MD  furosemide (LASIX) 20 MG tablet Take 20 mg by mouth daily.    [provider]  HYDROcodone-acetaminophen (NORCO/VICODIN) 5-325 MG tablet Take 1-2 tablets by mouth every 4 (four) hours as needed for moderate pain. 02/27/21   Nicole Kindred A, DO  insulin glargine (LANTUS SOLOSTAR) 100 UNIT/ML Solostar Pen Inject 25 Units into the skin daily. Patient taking differently: Inject 25 Units into the skin 2 (two) times daily. 02/27/21   Ezekiel Slocumb, DO  insulin lispro (HUMALOG KWIKPEN) 100 UNIT/ML KwikPen Inject 6 Units into the skin 3 (three) times daily. Patient taking differently: Inject 6 Units into the skin 2 (two) times daily. 07/25/20   Eugenie Filler, MD  INSUPEN ULTRAFIN 31G X 8 MM [place]  03/02/20   [provider]  methocarbamol (ROBAXIN) 500 MG tablet Take 1 tablet (500 mg total) by mouth every 6 (six) hours as needed for muscle spasms. 11/04/20   Bonnielee Haff, MD  midodrine (PROAMATINE) 10 MG tablet Take 1 tablet (10 mg total) by mouth 3 (three) times daily with meals. Patient taking differently: Take 10 mg by mouth daily. 07/25/20   Eugenie Filler, MD  prochlorperazine (COMPAZINE) 10 MG tablet Take 10 mg by mouth every 6 (six) hours as needed for nausea or vomiting.    [provider]  senna (SENOKOT) 8.6 MG TABS tablet Take 1 tablet (8.6 mg total) by mouth 2 (two) times daily. Patient taking differently: Take 1 tablet by mouth daily as needed for mild constipation. 11/04/20   Bonnielee Haff, MD  tamsulosin (FLOMAX) 0.4 MG CAPS capsule Take 0.4 mg by mouth daily. 10/22/20   [provider]  tiotropium  (SPIRIVA HANDIHALER) 18 MCG inhalation capsule Place 1 capsule (18 mcg total) into inhaler and inhale daily. 12/29/20 12/29/21  [place] Eric J, DO    Allergies    Patient has no known allergies.  Review of Systems   Review of Systems  Constitutional:  Negative for chills and fever.  HENT:  Negative for ear pain and sore throat.   Eyes:  Negative for pain and visual disturbance.  Respiratory:  Negative for cough and shortness  of breath.   Cardiovascular:  Positive for leg swelling. Negative for chest pain and palpitations.  Gastrointestinal:  Negative for abdominal pain and vomiting.  Genitourinary:  Negative for dysuria and hematuria.  Musculoskeletal:  Positive for arthralgias and joint swelling. Negative for back pain.  Skin:  Negative for color change and rash.  Neurological:  Negative for seizures and syncope.  All other systems reviewed and are negative.  Physical Exam Updated Vital Signs BP 121/89 (BP Location: Right Arm)   Pulse (!) 110   Temp 98.6 F (37 C) (Oral)   Resp 16   SpO2 96%   Physical Exam Vitals and nursing note reviewed.  Constitutional:      Appearance: He is well-developed.  HENT:     Head: Normocephalic and atraumatic.  Eyes:     Conjunctiva/sclera: Conjunctivae normal.     Pupils: Pupils are equal, round, and reactive to light.  Cardiovascular:     Rate and Rhythm: Normal rate and regular rhythm.     Heart sounds: No murmur heard. Pulmonary:     Effort: Pulmonary effort is normal. No respiratory distress.     Breath sounds: Normal breath sounds.  Chest:     Chest wall: No tenderness.  Abdominal:     Palpations: Abdomen is soft.     Tenderness: There is no abdominal tenderness. There is no guarding.  Musculoskeletal:     Cervical back: Normal range of motion. No rigidity or tenderness.     Comments: Tenderness palpation of the left hip with leg held in external rotation.  2+ distal pulses with full sensation.  Other extremities without any  tenderness to palpation, bruising, or swelling.  2+ distal pulses in all extremities.  Skin:    General: Skin is warm and dry.  Neurological:     Mental Status: He is alert.    ED Results / Procedures / Treatments   Labs (all labs ordered are listed, but only abnormal results are displayed) Labs Reviewed  CBC WITH DIFFERENTIAL/PLATELET - Abnormal; Notable for the following components:      Result Value   WBC 14.3 (*)    RBC 4.16 (*)    Hemoglobin 12.1 (*)    RDW 16.5 (*)    Platelets 134 (*)    Neutro Abs 11.8 (*)    Monocytes Absolute 1.3 (*)    All other components within normal limits  PROTIME-INR - Abnormal; Notable for the following components:   Prothrombin Time 16.2 (*)    INR 1.3 (*)    All other components within normal limits  COMPREHENSIVE METABOLIC PANEL - Abnormal; Notable for the following components:   Glucose, Bld 262 (*)    Creatinine, Ser 1.62 (*)    Calcium 8.7 (*)    Total Protein 6.0 (*)    Albumin 2.7 (*)    Total Bilirubin 2.3 (*)    GFR, Estimated 49 (*)    All other components within normal limits  RESP PANEL BY RT-PCR (FLU A&B, COVID) ARPGX2  SAMPLE TO BLOOD BANK    EKG None  Radiology DG Femur Min 2 Views Left  Result Date: 07/25/2021 CLINICAL DATA:  Patient tripped and fell this morning. Left thigh injury with inability to bear weight. EXAM: LEFT FEMUR 2 VIEWS COMPARISON:  Radiographs 12/26/2020.  Pelvic CT 02/21/2021. FINDINGS: Status post left femoral intramedullary nail fixation with intact hardware and no screw loosening. The previously demonstrated distal diaphyseal femur fracture has healed with moderate posttraumatic deformity. There is a new  acute, mildly comminuted intertrochanteric femur fracture. The femoral head is intact and located. The visualized left lower hemipelvis appears intact. IMPRESSION: 1. Comminuted intertrochanteric left femur fracture just above the femoral intramedullary nail. 2. Interval healing of distal diaphyseal  femur fracture. Electronically Signed   By: Richardean Sale M.D.   On: 07/20/2021 17:48    Procedures Procedures   Medications Ordered in ED Medications  sodium chloride flush (NS) 0.9 % injection 10-40 mL (10 mLs Intracatheter Not Given 08/12/2021 2158)  sodium chloride flush (NS) 0.9 % injection 10-40 mL (has no administration in time range)  Chlorhexidine Gluconate Cloth 2 % PADS 6 each (6 each Topical Not Given 08/09/2021 2157)  morphine 4 MG/ML injection 4 mg (4 mg Intravenous Given 07/13/2021 2012)    ED Course  I have reviewed the triage vital signs and the nursing notes.  Pertinent labs & imaging results that were available during my care of the patient were reviewed by me and considered in my medical decision making (see chart for details).    MDM Rules/Calculators/A&P                          Patient is hemodynamically stable and well-appearing.  Tenderness to the left hip and leg held in external rotation but neurovascularly intact.  There is a left intertrochanteric fracture above prior femur nail procedure.  No other evidence of traumatic injuries on exam.  No head injury, neck pain. orthopedics consulted.  They believe patient is a poor surgical candidate secondary to his many medical comorbidities.  Patient admitted to medicine for pain control and PT OT.  Will likely reengage orthopedics for in person evaluation in the morning.  Given 4 mg of IV morphine for pain control with good effect.  Patient admitted in stable condition.   Final Clinical Impression(s) / ED Diagnoses Final diagnoses:  Fall, initial encounter  Closed nondisplaced intertrochanteric fracture of left femur, initial encounter [facility])    Rx / DC Orders ED Discharge Orders     None        Coralee Pesa, MD 07/18/2021 [id number]    Gareth [name], MD 08-08-21 1820

## 2021-08-02 NOTE — ED Provider Notes (Signed)
Emergency Medicine Provider Triage Evaluation Note  Dennis [name]. , a 60 y.o. male  was evaluated in triage.  Pt complains of fall. He states that he was getting up and walking with his walker this morning and dropped water and he fell.  He feels like he rebroke his left femur.  He denies striking his head.  As he is in hospice he was taken off his blood thinning medications.  He denies any other injuries.  I confirmed with patient that he is both DNR and DNI.  Review of Systems  Positive: Left thigh pain Negative: Headache  Physical Exam  BP 107/71 (BP Location: Right Arm)   Pulse (!) 106   Temp 98.6 F (37 C) (Oral)   Resp 18   SpO2 99%  Gen:   Awake, no distress   Resp:  Normal effort  MSK:   Pain in left thigh.  Is able to move left foot, palpable left pedal pulses. Other:  Sensation intact to light touch to left thigh.  Medical Decision Making  Medically screening exam initiated at 4:47 PM.  Appropriate orders placed.  Dennis [name]. was informed that the remainder of the evaluation will be completed by another provider, this initial triage assessment does not replace that evaluation, and the importance of remaining in the ED until their evaluation is complete.  Note: Portions of this report may have been transcribed using voice recognition software. Every effort was made to ensure accuracy; however, inadvertent computerized transcription errors may be present  Called charge for a room.      Lorin Glass, PA-C 07/27/2021 1658    Lucrezia Starch, MD 08/08/2021 [phone]

## 2021-08-02 NOTE — ED Triage Notes (Signed)
Pt reports tripping and falling this morning, injuring L thigh. Reports unable to bear weight on same. Denies other injury.

## 2021-08-02 NOTE — H&P (Signed)
History and Physical    Dennis [name]. HWE:[id number] DOB: 1961/03/27 DOA: 07/29/2021  PCP: [name], MD  Patient coming from: Home   Chief Complaint:  Chief Complaint  Patient presents with   Fall     HPI:    60 year old male with past medical history of small cell lung cancer with mets to brain (Dx 12/2019) status post whole brain radiation and chemotherapy which has since been discontinued, systolic and diastolic congestive heart failure (Echo 02/2021 EF < 20%) status post AICD, coronary artery disease, history of cocaine abuse (last use 2017), right MCA stroke, hypertension, hyperlipidemia, insulin dependent diabetes mellitus type 2, bilateral pulmonary emboli   (02/2021 on Lovenox, now discontinued), benign prostatic hyperplasia who is currently enrolled in hospice who presents to [facility] emergency department status post fall with complaints of left thigh and hip pain.  Patient explains that at approximately 5:00 this morning he got up from bed to walk to the kitchen to get a glass of water.  Patient typically uses a walker to ambulate and unfortunately while having a glass of water in his hand tripped and fell, landing on his left hip.  Patient immediately began to experience severe left hip pain.  Pain is sharp in quality, worse with movement without radiation.  Patient denies any lightheadedness or loss of consciousness that precipitated his fall.  Patient's severe pain continued to persist throughout the day until the patient's brother brought the patient to [facility] emergency department for evaluation.  Upon evaluation in the emergency department x-ray of the left femur revealed a intertrochanteric left femur fracture just above the femoral intramedullary nail.  ER provider discussed case with Dr. Gloriann Loan who felt that patient was not an appropriate surgical candidate to proceed with intervention.  Due to patient's continued inability to ambulate the  hospitalist group was contacted to assess the patient for admission to the hospital.  Review of Systems:   Review of Systems  All other systems reviewed and are negative.  Past Medical History:  Diagnosis Date   Alcohol abuse with alcohol-induced mood disorder ([place]) 08/18/2015   Automatic implantable cardioverter-defibrillator in situ 2012   S/P St Jude Dual Chamber. ICD.   CAD (coronary artery disease) 11/16/2014   CAD Coronary angiography (12/2008) with mid LAD totally occluded and collateralized.   Cardiac LV ejection fraction 10-20%    Chest pain 09/04/2018   CHF (congestive heart failure) (HCC)    Chronic systolic heart failure ([place])    a) Mixed ICM/NICM b) RHC (05/2014): RA 2, RV 19/2/3, PA 22/14 (18), PCWP 6, Fick CO/CI: 5.2 / 2.7, PVR 2.3 WU, PA 60% and 64% c) ECHO (05/2014): EF 20-25%, diff HK, akinesis entireanteroseptal myocardium, triv AI, mod MR, LA mod/sev dilated   Cocaine abuse ([place]) 01/24/2015   Cocaine abuse with cocaine-induced mood disorder ([place]) 08/20/2015   Drug abuse and dependence ([place])    Dysphagia following cerebral infarction 8/93/8101   Embolic stroke involving right middle cerebral artery (HCC)    Extensive stage primary small cell carcinoma of lung ([place]) 01/04/2020   H/O noncompliance with medical treatment, presenting hazards to health 01/24/2015   Heart murmur    High cholesterol    Ischemic cardiomyopathy    a) Coronary angiography (12/2008) at [facility]: Lmain: nl, LAD mid 100% stenosis with left to left and right-to-left collaterals to the distal LAD; Lcx: nl, RCA nl.     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HCC)    MDD (major depressive disorder), recurrent severe,  without psychosis ([place]) 08/18/2015   Open wound of foot 02/27/2015   Pneumonia 1988   Psychoactive substance-induced mood disorder ([place]) 07/17/2015   Sleep apnea    "cleared after T&A"   Status post PICC central line placement    Stroke [facility])    Substance induced mood disorder ([place]) 08/17/2015   Type  II diabetes mellitus ([place])     Past Surgical History:  Procedure Laterality Date   BRONCHIAL BRUSHINGS  12/28/2019   Procedure: BRONCHIAL BRUSHINGS;  Surgeon: Garner Nash, DO;  Location: [place];  Service: Thoracic;;   BRONCHIAL NEEDLE ASPIRATION BIOPSY  12/28/2019   Procedure: BRONCHIAL NEEDLE ASPIRATION BIOPSIES;  Surgeon: Garner Nash, DO;  Location: [place];  Service: Thoracic;;   BRONCHIAL WASHINGS  12/28/2019   Procedure: BRONCHIAL WASHINGS;  Surgeon: Garner Nash, DO;  Location: [place];  Service: Thoracic;;   CARDIAC CATHETERIZATION  05/2014   CARDIAC DEFIBRILLATOR PLACEMENT  03/2011   CHOLECYSTECTOMY  1994   FEMUR IM NAIL Left 10/31/2020   Procedure: INTRAMEDULLARY (IM) RETROGRADE FEMORAL NAILING;  Surgeon: Shona Needles, MD;  Location: [place];  Service: Orthopedics;  Laterality: Left;   FOREARM FRACTURE SURGERY Left 1980   FRACTURE SURGERY     IR IMAGING GUIDED PORT INSERTION  10/11/2020   RIGHT HEART CATHETERIZATION N/A 05/30/2014   Procedure: RIGHT HEART CATH;  Surgeon: Jolaine Artist, MD;  Location: [facility] CATH LAB;  Service: Cardiovascular;  Laterality: N/A;   RIGHT HEART CATHETERIZATION N/A 02/07/2015   Procedure: RIGHT HEART CATH;  Surgeon: Jolaine Artist, MD;  Location: [facility] CATH LAB;  Service: Cardiovascular;  Laterality: N/A;   TONSILLECTOMY AND ADENOIDECTOMY  ~ [place] N/A 12/28/2019   Procedure: VIDEO BRONCHOSCOPY WITH ENDOBRONCHIAL ULTRASOUND;  Surgeon: Garner Nash, DO;  Location: [place] ENDOSCOPY;  Service: Thoracic;  Laterality: N/A;     reports that he has quit smoking. His smoking use included cigarettes. He has a 7.75 pack-year smoking history. He has never used smokeless tobacco. He reports that he does not currently use alcohol. He reports current drug use. Drug: Cocaine.  No Known Allergies  Family History  Problem Relation Age of Onset   Diabetes Mother    Heart disease Mother     Diabetes Father    Prostate cancer Father    Heart disease Father    Diabetes Brother    Diabetes Brother      Prior to Admission medications   Medication Sig Start Date End Date Taking? Authorizing Provider  Accu-Chek Softclix Lancets lancets  02/03/20   [provider]  acetaminophen (TYLENOL) 325 MG tablet Take 2 tablets (650 mg total) by mouth every 6 (six) hours as needed for mild pain (or Fever >/= 101). 02/27/21   Ezekiel Slocumb, DO  atorvastatin (LIPITOR) 20 MG tablet TAKE 1 TABLET BY MOUTH DAILY AT 6 PM. Patient taking differently: Take 20 mg by mouth daily. 03/11/17   Bensimhon, Shaune Pascal, MD  Blood Glucose Monitoring Suppl (FIFTY50 GLUCOSE METER 2.0) w/Device KIT Use as instructed 02/03/20   [provider]  budesonide-formoterol (SYMBICORT) 160-4.5 MCG/ACT inhaler Inhale 2 puffs into the lungs 2 (two) times daily. 12/29/20   [place] Eric J, DO  dexamethasone (DECADRON) 1 MG tablet Take 1 tablet (1 mg total) by mouth daily. 12/18/20   Vaslow, Acey Lav, MD  enoxaparin (LOVENOX) 80 MG/0.8ML injection Inject 0.75 mLs (75 mg total) into the skin every 12 (twelve) hours.**Pt must waste  0.5 mLs before injecting.** 05/01/21   Curt Bears, MD  feeding supplement (ENSURE ENLIVE / ENSURE PLUS) LIQD Take 237 mLs by mouth 2 (two) times daily between meals. Patient taking differently: Take 237 mLs by mouth 3 (three) times daily between meals. 07/26/20   Eugenie Filler, MD  furosemide (LASIX) 20 MG tablet Take 20 mg by mouth daily.    [provider]  HYDROcodone-acetaminophen (NORCO/VICODIN) 5-325 MG tablet Take 1-2 tablets by mouth every 4 (four) hours as needed for moderate pain. 02/27/21   Nicole Kindred A, DO  insulin glargine (LANTUS SOLOSTAR) 100 UNIT/ML Solostar Pen Inject 25 Units into the skin daily. Patient taking differently: Inject 25 Units into the skin 2 (two) times daily. 02/27/21   Ezekiel Slocumb, DO  insulin lispro (HUMALOG KWIKPEN) 100 UNIT/ML  KwikPen Inject 6 Units into the skin 3 (three) times daily. Patient taking differently: Inject 6 Units into the skin 2 (two) times daily. 07/25/20   Eugenie Filler, MD  INSUPEN ULTRAFIN 31G X 8 MM [place]  03/02/20   [provider]  methocarbamol (ROBAXIN) 500 MG tablet Take 1 tablet (500 mg total) by mouth every 6 (six) hours as needed for muscle spasms. 11/04/20   Bonnielee Haff, MD  midodrine (PROAMATINE) 10 MG tablet Take 1 tablet (10 mg total) by mouth 3 (three) times daily with meals. Patient taking differently: Take 10 mg by mouth daily. 07/25/20   Eugenie Filler, MD  prochlorperazine (COMPAZINE) 10 MG tablet Take 10 mg by mouth every 6 (six) hours as needed for nausea or vomiting.    [provider]  senna (SENOKOT) 8.6 MG TABS tablet Take 1 tablet (8.6 mg total) by mouth 2 (two) times daily. Patient taking differently: Take 1 tablet by mouth daily as needed for mild constipation. 11/04/20   Bonnielee Haff, MD  tamsulosin (FLOMAX) 0.4 MG CAPS capsule Take 0.4 mg by mouth daily. 10/22/20   [provider]  tiotropium (SPIRIVA HANDIHALER) 18 MCG inhalation capsule Place 1 capsule (18 mcg total) into inhaler and inhale daily. 12/29/20 12/29/21  [place] Eric J, DO    Physical Exam: Vitals:   07/31/2021 1601 08/09/2021 1808 08/09/2021 1928 07/24/2021 2145  BP: 107/71 (!) 121/57 (!) 126/92 121/89  Pulse: (!) 106 (!) 117 88 (!) 110  Resp: _0 Temp: 98.6 F (37 C)     TempSrc: Oral     SpO2: 99% 94% 97% 96%    Constitutional: Awake alert and oriented x3, patient is in distress due to ongoing left hip pain. Skin: no rashes, no lesions, good skin turgor noted. Eyes: Pupils are equally reactive to light.  No evidence of scleral icterus or conjunctival pallor.  ENMT: Moist mucous membranes noted.  Posterior pharynx clear of any exudate or lesions.   Neck: normal, supple, no masses, no thyromegaly.  No evidence of jugular venous distension.   Respiratory: clear  to auscultation bilaterally, no wheezing, no crackles. Normal respiratory effort. No accessory muscle use.  Cardiovascular: Regular rate and rhythm, no murmurs / rubs / gallops. No extremity edema. 2+ pedal pulses. No carotid bruits.  Chest:   Nontender without crepitus or deformity.   Back:   Nontender without crepitus or deformity. Abdomen: Abdomen is soft and nontender.  No evidence of intra-abdominal masses.  Positive bowel sounds noted in all quadrants.   Musculoskeletal: Outward rotation of the left foot noted.  Significant deformity noted of the lateral left hip.  Significant point tenderness over  the left hip noted.  Severe pain with passive and active range of motion of the left hip joint.  no contractures. Normal muscle tone.  Neurologic: CN 2-12 grossly intact. Sensation intact.  Patient is unable to adequately move left lower extremity but otherwise patient is moving all other extremities spontaneously.  Patient is following all commands.  Patient is responsive to verbal stimuli.   Psychiatric: Patient exhibits normal mood with appropriate affect.  Patient seems to possess insight as to their current situation.     Labs on Admission: I have personally reviewed following labs and imaging studies -   CBC: Recent Labs  Lab 08/07/2021 1653  WBC 14.3*  NEUTROABS 11.8*  HGB 12.1*  HCT 39.5  MCV 95.0  PLT 415*   Basic Metabolic Panel: Recent Labs  Lab 08/06/2021 1653  NA 136  K 4.7  CL 103  CO2 22  GLUCOSE 262*  BUN 19  CREATININE 1.62*  CALCIUM 8.7*   GFR: CrCl cannot be calculated (Unknown ideal weight.). Liver Function Tests: Recent Labs  Lab 08/03/2021 1653  AST 17  ALT 14  ALKPHOS 118  BILITOT 2.3*  PROT 6.0*  ALBUMIN 2.7*   No results for input(s): LIPASE, AMYLASE in the last 168 hours. No results for input(s): AMMONIA in the last 168 hours. Coagulation Profile: Recent Labs  Lab 07/22/2021 1653  INR 1.3*   Cardiac Enzymes: No results for input(s): CKTOTAL,  CKMB, CKMBINDEX, TROPONINI in the last 168 hours. BNP (last 3 results) No results for input(s): PROBNP in the last 8760 hours. HbA1C: No results for input(s): HGBA1C in the last 72 hours. CBG: No results for input(s): GLUCAP in the last 168 hours. Lipid Profile: No results for input(s): CHOL, HDL, LDLCALC, TRIG, CHOLHDL, LDLDIRECT in the last 72 hours. Thyroid Function Tests: No results for input(s): TSH, T4TOTAL, FREET4, T3FREE, THYROIDAB in the last 72 hours. Anemia Panel: No results for input(s): VITAMINB12, FOLATE, FERRITIN, TIBC, IRON, RETICCTPCT in the last 72 hours. Urine analysis:    Component Value Date/Time   COLORURINE YELLOW 02/22/2021 1628   APPEARANCEUR CLOUDY (A) 02/22/2021 1628   LABSPEC 1.019 02/22/2021 1628   PHURINE 5.0 02/22/2021 1628   GLUCOSEU 150 (A) 02/22/2021 1628   HGBUR SMALL (A) 02/22/2021 1628   BILIRUBINUR NEGATIVE 02/22/2021 1628   KETONESUR NEGATIVE 02/22/2021 1628   PROTEINUR 100 (A) 02/22/2021 1628   UROBILINOGEN 1.0 07/16/2015 1715   NITRITE POSITIVE (A) 02/22/2021 1628   LEUKOCYTESUR LARGE (A) 02/22/2021 1628    Radiological Exams on Admission - Personally Reviewed: DG Femur Min 2 Views Left  Result Date: 07/24/2021 CLINICAL DATA:  Patient tripped and fell this morning. Left thigh injury with inability to bear weight. EXAM: LEFT FEMUR 2 VIEWS COMPARISON:  Radiographs 12/26/2020.  Pelvic CT 02/21/2021. FINDINGS: Status post left femoral intramedullary nail fixation with intact hardware and no screw loosening. The previously demonstrated distal diaphyseal femur fracture has healed with moderate posttraumatic deformity. There is a new acute, mildly comminuted intertrochanteric femur fracture. The femoral head is intact and located. The visualized left lower hemipelvis appears intact. IMPRESSION: 1. Comminuted intertrochanteric left femur fracture just above the femoral intramedullary nail. 2. Interval healing of distal diaphyseal femur fracture.  Electronically Signed   By: Richardean Sale M.D.   On: 07/26/2021 17:48      Assessment/Plan  * Closed fracture of distal end of left femur, unspecified fracture morphology, initial encounter [facility]) Patient presenting status post what he reports as a mechanical fall with left  hip and thigh pain Notable intertrochanteric left hip fracture just above the previously placed intramedullary nail (10/2020) which was performed by Dr. Doreatha Martin ER provider discussed case with Dr. Gloriann Loan, on-call for orthopedic surgery who stated that they felt that the patient was not an appropriate candidate for surgical intervention but recommended that we reach out directly to Dr. Doreatha Martin to discuss further ER provider has attempted to reach Dr. Doreatha Martin but since he is not on-call attempt has been unsuccessful Will attempt to reach Dr. Doreatha Martin or one of their associates tomorrow morning to reevaluate any therapeutic options for this patient In the meantime, will provide patient with as needed analgesics for substantial associated pain While intervention in the morning is unlikely, will make patient n.p.o. after midnight.  Extensive stage primary small cell carcinoma of lung (HCC) Advanced small cell lung cancer diagnosed 12/2019 Known metastatic disease to the brain Patient is no longer receiving chemotherapy and is currently on hospice Has been following with Dr. Julien Nordmann as an outpatient the past   Goals of care, counseling/discussion Patient suffers from multiple advanced medical problems including advanced cardiomyopathy and metastatic small cell lung cancer Overall prognosis is extremely poor Patient is currently receiving hospice services with [facility] It has been confirmed that patient remains DNR That being said, patient still wishes to pursue some sort of curative intervention for his left hip fracture as patient was ambulating with a cane or walker just prior to the fall.  Chronic combined systolic and  diastolic CHF (congestive heart failure) (HCC) No evidence of cardiogenic volume overload at this time.    Type 2 diabetes mellitus with hyperglycemia, with long-term current use of insulin (HCC) Patient been placed on Accu-Cheks before every meal and nightly with sliding scale insulin Continuing home regimen of basal insulin therapy Hemoglobin A1C ordered Diabetic Diet   History of pulmonary embolism Bilateral pulmonary emboli diagnosed in 02/2021 Of note, patient additionally has a remote history of DVT Patient was on subcutaneous Lovenox after his hospitalization in May however this has since been discontinued since the patient has been entered into hospice care. Patient currently not exhibiting any shortness of breath or oxygen requirement  Hyperlipidemia associated with type 2 diabetes mellitus ([place]) Continuing home regimen of lipid lowering therapy.   Benign prostatic hyperplasia Continue home regimen of Flomax     Code Status:  DNR  code status decision has been confirmed with: patient Family Communication: Spoke to patient's brother via phone conversation who has been updated on plan of care.  Status is: Observation  The patient remains OBS appropriate and will d/c before 2 midnights.       Vernelle Emerald MD [facility] Pager [phone]  If 7PM-7AM, please contact night-coverage [email or web address] Use universal [place] password for that web site. If you do not have the password, please call the hospital operator.  08/03/2021, 12:47 AM

## 2021-08-02 NOTE — Progress Notes (Signed)
Manufacturing engineer [facility])   Mr. Gancarz is currently a hospice pt with [facility], admitted with a terminal diagnosis of small cell lung cancer.  [facility] liaisons will continue to follow for any discharge planning needs and to coordinate continuation of hospice care.    Thank you for the opportunity to participate in this patient's care.     Domenic Moras, BSN, RN [facility] Liaison [phone] (24h on call)

## 2021-08-03 DIAGNOSIS — F1410 Cocaine abuse, uncomplicated: Secondary | ICD-10-CM | POA: Diagnosis present

## 2021-08-03 DIAGNOSIS — I5042 Chronic combined systolic (congestive) and diastolic (congestive) heart failure: Secondary | ICD-10-CM | POA: Diagnosis not present

## 2021-08-03 DIAGNOSIS — W010XXA Fall on same level from slipping, tripping and stumbling without subsequent striking against object, initial encounter: Secondary | ICD-10-CM | POA: Diagnosis present

## 2021-08-03 DIAGNOSIS — E8809 Other disorders of plasma-protein metabolism, not elsewhere classified: Secondary | ICD-10-CM | POA: Diagnosis present

## 2021-08-03 DIAGNOSIS — J449 Chronic obstructive pulmonary disease, unspecified: Secondary | ICD-10-CM | POA: Diagnosis present

## 2021-08-03 DIAGNOSIS — I69391 Dysphagia following cerebral infarction: Secondary | ICD-10-CM | POA: Diagnosis not present

## 2021-08-03 DIAGNOSIS — N1831 Chronic kidney disease, stage 3a: Secondary | ICD-10-CM | POA: Diagnosis present

## 2021-08-03 DIAGNOSIS — S72145A Nondisplaced intertrochanteric fracture of left femur, initial encounter for closed fracture: Secondary | ICD-10-CM | POA: Diagnosis present

## 2021-08-03 DIAGNOSIS — I255 Ischemic cardiomyopathy: Secondary | ICD-10-CM | POA: Diagnosis not present

## 2021-08-03 DIAGNOSIS — R092 Respiratory arrest: Secondary | ICD-10-CM | POA: Diagnosis not present

## 2021-08-03 DIAGNOSIS — I5020 Unspecified systolic (congestive) heart failure: Secondary | ICD-10-CM | POA: Diagnosis not present

## 2021-08-03 DIAGNOSIS — E1122 Type 2 diabetes mellitus with diabetic chronic kidney disease: Secondary | ICD-10-CM | POA: Diagnosis present

## 2021-08-03 DIAGNOSIS — Y92002 Bathroom of unspecified non-institutional (private) residence single-family (private) house as the place of occurrence of the external cause: Secondary | ICD-10-CM | POA: Diagnosis not present

## 2021-08-03 DIAGNOSIS — I959 Hypotension, unspecified: Secondary | ICD-10-CM | POA: Diagnosis present

## 2021-08-03 DIAGNOSIS — C7931 Secondary malignant neoplasm of brain: Secondary | ICD-10-CM | POA: Diagnosis present

## 2021-08-03 DIAGNOSIS — M9702XA Periprosthetic fracture around internal prosthetic left hip joint, initial encounter: Secondary | ICD-10-CM | POA: Diagnosis present

## 2021-08-03 DIAGNOSIS — Z01810 Encounter for preprocedural cardiovascular examination: Secondary | ICD-10-CM | POA: Diagnosis not present

## 2021-08-03 DIAGNOSIS — E1169 Type 2 diabetes mellitus with other specified complication: Secondary | ICD-10-CM | POA: Diagnosis present

## 2021-08-03 DIAGNOSIS — I130 Hypertensive heart and chronic kidney disease with heart failure and stage 1 through stage 4 chronic kidney disease, or unspecified chronic kidney disease: Secondary | ICD-10-CM | POA: Diagnosis present

## 2021-08-03 DIAGNOSIS — Z9221 Personal history of antineoplastic chemotherapy: Secondary | ICD-10-CM | POA: Diagnosis not present

## 2021-08-03 DIAGNOSIS — Z8616 Personal history of COVID-19: Secondary | ICD-10-CM | POA: Diagnosis not present

## 2021-08-03 DIAGNOSIS — R17 Unspecified jaundice: Secondary | ICD-10-CM | POA: Diagnosis present

## 2021-08-03 DIAGNOSIS — S72402A Unspecified fracture of lower end of left femur, initial encounter for closed fracture: Secondary | ICD-10-CM | POA: Diagnosis not present

## 2021-08-03 DIAGNOSIS — C3491 Malignant neoplasm of unspecified part of right bronchus or lung: Secondary | ICD-10-CM | POA: Diagnosis present

## 2021-08-03 DIAGNOSIS — I340 Nonrheumatic mitral (valve) insufficiency: Secondary | ICD-10-CM | POA: Diagnosis present

## 2021-08-03 DIAGNOSIS — N400 Enlarged prostate without lower urinary tract symptoms: Secondary | ICD-10-CM | POA: Diagnosis present

## 2021-08-03 DIAGNOSIS — D649 Anemia, unspecified: Secondary | ICD-10-CM

## 2021-08-03 DIAGNOSIS — E1165 Type 2 diabetes mellitus with hyperglycemia: Secondary | ICD-10-CM | POA: Diagnosis present

## 2021-08-03 DIAGNOSIS — Z515 Encounter for palliative care: Secondary | ICD-10-CM | POA: Diagnosis not present

## 2021-08-03 DIAGNOSIS — Z66 Do not resuscitate: Secondary | ICD-10-CM | POA: Diagnosis present

## 2021-08-03 DIAGNOSIS — I428 Other cardiomyopathies: Secondary | ICD-10-CM | POA: Diagnosis present

## 2021-08-03 DIAGNOSIS — Y9301 Activity, walking, marching and hiking: Secondary | ICD-10-CM | POA: Diagnosis present

## 2021-08-03 DIAGNOSIS — Y92003 Bedroom of unspecified non-institutional (private) residence as the place of occurrence of the external cause: Secondary | ICD-10-CM | POA: Diagnosis not present

## 2021-08-03 DIAGNOSIS — E871 Hypo-osmolality and hyponatremia: Secondary | ICD-10-CM | POA: Diagnosis not present

## 2021-08-03 DIAGNOSIS — M25552 Pain in left hip: Secondary | ICD-10-CM | POA: Diagnosis present

## 2021-08-03 DIAGNOSIS — C3490 Malignant neoplasm of unspecified part of unspecified bronchus or lung: Secondary | ICD-10-CM | POA: Diagnosis not present

## 2021-08-03 DIAGNOSIS — Z86711 Personal history of pulmonary embolism: Secondary | ICD-10-CM | POA: Diagnosis present

## 2021-08-03 LAB — CBC WITH DIFFERENTIAL/PLATELET
Abs Immature Granulocytes: 0.09 10*3/uL — ABNORMAL HIGH (ref 0.00–0.07)
Basophils Absolute: 0 10*3/uL (ref 0.0–0.1)
Basophils Relative: 0 %
Eosinophils Absolute: 0.1 10*3/uL (ref 0.0–0.5)
Eosinophils Relative: 1 %
HCT: 39 % (ref 39.0–52.0)
Hemoglobin: 11.7 g/dL — ABNORMAL LOW (ref 13.0–17.0)
Immature Granulocytes: 1 %
Lymphocytes Relative: 9 %
Lymphs Abs: 1.3 10*3/uL (ref 0.7–4.0)
MCH: 29 pg (ref 26.0–34.0)
MCHC: 30 g/dL (ref 30.0–36.0)
MCV: 96.5 fL (ref 80.0–100.0)
Monocytes Absolute: 1.2 10*3/uL — ABNORMAL HIGH (ref 0.1–1.0)
Monocytes Relative: 8 %
Neutro Abs: 11.5 10*3/uL — ABNORMAL HIGH (ref 1.7–7.7)
Neutrophils Relative %: 81 %
Platelets: 128 10*3/uL — ABNORMAL LOW (ref 150–400)
RBC: 4.04 MIL/uL — ABNORMAL LOW (ref 4.22–5.81)
RDW: 16.5 % — ABNORMAL HIGH (ref 11.5–15.5)
WBC: 14.2 10*3/uL — ABNORMAL HIGH (ref 4.0–10.5)
nRBC: 0.2 % (ref 0.0–0.2)

## 2021-08-03 LAB — COMPREHENSIVE METABOLIC PANEL
ALT: 14 U/L (ref 0–44)
AST: 15 U/L (ref 15–41)
Albumin: 2.5 g/dL — ABNORMAL LOW (ref 3.5–5.0)
Alkaline Phosphatase: 107 U/L (ref 38–126)
Anion gap: 11 (ref 5–15)
BUN: 23 mg/dL — ABNORMAL HIGH (ref 6–20)
CO2: 23 mmol/L (ref 22–32)
Calcium: 8.6 mg/dL — ABNORMAL LOW (ref 8.9–10.3)
Chloride: 103 mmol/L (ref 98–111)
Creatinine, Ser: 1.5 mg/dL — ABNORMAL HIGH (ref 0.61–1.24)
GFR, Estimated: 53 mL/min — ABNORMAL LOW (ref >60)
Glucose, Bld: 261 mg/dL — ABNORMAL HIGH (ref 70–99)
Potassium: 4.4 mmol/L (ref 3.5–5.1)
Sodium: 137 mmol/L (ref 135–145)
Total Bilirubin: 2.5 mg/dL — ABNORMAL HIGH (ref 0.3–1.2)
Total Protein: 6 g/dL — ABNORMAL LOW (ref 6.5–8.1)

## 2021-08-03 LAB — PHOSPHORUS: Phosphorus: 3.5 mg/dL (ref 2.5–4.6)

## 2021-08-03 LAB — MAGNESIUM: Magnesium: 1.6 mg/dL — ABNORMAL LOW (ref 1.7–2.4)

## 2021-08-03 MED ORDER — MIDODRINE HCL 5 MG PO TABS
10.0000 mg | ORAL_TABLET | Freq: Three times a day (TID) | ORAL | Status: DC
  Administered 2021-08-03 – 2021-08-05 (×6): 10 mg via ORAL
  Filled 2021-08-03 (×6): qty 2

## 2021-08-03 MED ORDER — ENOXAPARIN SODIUM 40 MG/0.4ML IJ SOSY
40.0000 mg | PREFILLED_SYRINGE | INTRAMUSCULAR | Status: DC
  Administered 2021-08-03 – 2021-08-04 (×2): 40 mg via SUBCUTANEOUS
  Filled 2021-08-03 (×2): qty 0.4

## 2021-08-03 MED ORDER — MAGNESIUM SULFATE 2 GM/50ML IV SOLN
2.0000 g | Freq: Once | INTRAVENOUS | Status: AC
  Administered 2021-08-03: 2 g via INTRAVENOUS
  Filled 2021-08-03: qty 50

## 2021-08-03 MED ORDER — UMECLIDINIUM BROMIDE 62.5 MCG/ACT IN AEPB
1.0000 | INHALATION_SPRAY | Freq: Every day | RESPIRATORY_TRACT | Status: DC
  Administered 2021-08-04 – 2021-08-05 (×2): 1 via RESPIRATORY_TRACT
  Filled 2021-08-03: qty 7

## 2021-08-03 MED ORDER — INSULIN GLARGINE-YFGN 100 UNIT/ML ~~LOC~~ SOLN
15.0000 [IU] | Freq: Every day | SUBCUTANEOUS | Status: DC
  Administered 2021-08-03 – 2021-08-05 (×3): 15 [IU] via SUBCUTANEOUS
  Filled 2021-08-03 (×3): qty 0.15

## 2021-08-03 MED ORDER — FENTANYL CITRATE PF 50 MCG/ML IJ SOSY
25.0000 ug | PREFILLED_SYRINGE | INTRAMUSCULAR | Status: DC | PRN
Start: 2021-08-03 — End: 2021-08-06
  Administered 2021-08-03 – 2021-08-05 (×11): 25 ug via INTRAVENOUS
  Filled 2021-08-03 (×10): qty 1

## 2021-08-03 MED ORDER — INSULIN GLARGINE 100 UNIT/ML SOLOSTAR PEN: 15.0000 [IU] | PEN_INJECTOR | Freq: Every day | SUBCUTANEOUS | Status: DC

## 2021-08-03 MED ORDER — FUROSEMIDE 20 MG PO TABS
20.0000 mg | ORAL_TABLET | Freq: Every day | ORAL | Status: DC
  Administered 2021-08-03 – 2021-08-04 (×2): 20 mg via ORAL
  Filled 2021-08-03 (×2): qty 1

## 2021-08-03 MED ORDER — TIOTROPIUM BROMIDE MONOHYDRATE 18 MCG IN CAPS: 18.0000 ug | ORAL_CAPSULE | Freq: Every day | RESPIRATORY_TRACT | Status: DC

## 2021-08-03 MED ORDER — MOMETASONE FURO-FORMOTEROL FUM 200-5 MCG/ACT IN AERO
2.0000 | INHALATION_SPRAY | Freq: Two times a day (BID) | RESPIRATORY_TRACT | Status: DC
  Administered 2021-08-03 – 2021-08-05 (×4): 2 via RESPIRATORY_TRACT
  Filled 2021-08-03: qty 8.8

## 2021-08-03 MED ORDER — ATORVASTATIN CALCIUM 10 MG PO TABS
20.0000 mg | ORAL_TABLET | Freq: Every evening | ORAL | Status: DC
  Administered 2021-08-03 – 2021-08-04 (×2): 20 mg via ORAL
  Filled 2021-08-03 (×2): qty 2

## 2021-08-03 MED ORDER — DEXAMETHASONE 0.5 MG PO TABS
1.0000 mg | ORAL_TABLET | Freq: Every day | ORAL | Status: DC
  Administered 2021-08-03 – 2021-08-04 (×2): 1 mg via ORAL
  Filled 2021-08-03 (×3): qty 2

## 2021-08-03 MED ORDER — VITAMIN D 25 MCG (1000 UNIT) PO TABS
2000.0000 [IU] | ORAL_TABLET | Freq: Two times a day (BID) | ORAL | Status: DC
  Administered 2021-08-03 – 2021-08-04 (×3): 2000 [IU] via ORAL
  Filled 2021-08-03 (×3): qty 2

## 2021-08-03 MED ORDER — TAMSULOSIN HCL 0.4 MG PO CAPS
0.4000 mg | ORAL_CAPSULE | Freq: Every day | ORAL | Status: DC
  Administered 2021-08-03 – 2021-08-04 (×2): 0.4 mg via ORAL
  Filled 2021-08-03 (×2): qty 1

## 2021-08-03 MED ORDER — FENTANYL CITRATE PF 50 MCG/ML IJ SOSY
12.5000 ug | PREFILLED_SYRINGE | INTRAMUSCULAR | Status: DC | PRN
  Administered 2021-08-04: 12.5 ug via INTRAVENOUS
  Filled 2021-08-03 (×2): qty 1

## 2021-08-03 NOTE — Assessment & Plan Note (Addendum)
   Advanced small cell lung cancer diagnosed 12/2019  Known metastatic disease to the brain  Patient is no longer receiving chemotherapy and is currently on hospice  Has been following with Dr. Julien Nordmann as an outpatient the past

## 2021-08-03 NOTE — Assessment & Plan Note (Addendum)
   Bilateral pulmonary emboli diagnosed in 02/2021  Of note, patient additionally has a remote history of DVT  Patient was on subcutaneous Lovenox after his hospitalization in May however this has since been discontinued since the patient has been entered into hospice care.  Patient currently not exhibiting any shortness of breath or oxygen requirement

## 2021-08-03 NOTE — ED Notes (Signed)
Pt resting in stretcher. No distress noted, denies needs, denies pain. Left foot with nonpitting edema. Pt denies needs at this time, call light in reach. Pt remains NPO pending surgical repair of L femoral fx.

## 2021-08-03 NOTE — Progress Notes (Signed)
PROGRESS NOTE    Dennis [name].  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  Patient stated that approximately around 5 AM he was in his normal state of health when he got up the bed to the kitchen to get a glass of water.  He typically uses a walker to ambulate unfortunately while having aggressive water in his hand he dropped a and fell on the water I was on the floor landing on his left hip immediately.  Patient developed severe left hip pain and described it as sharp in quality and worse with movement without any radiation.  He denies any lightheadedness or dizziness or loss of consciousness prior to his fall.  Because of his severe pain that persisted throughout the day and inability to ambulate the patient's brother brought patient's mother to [facility] for further evaluation.  In the ED he underwent imaging of the left femur which revealed an intertrochanteric left femur fracture just above the femoral intramedullary nail.  The ED provider discussed with Dr. Ronnie Derby who felt that the patient was not an appropriate surgical candidate at this time but recommended being stabilized and having orthopedic trauma  surgery Dr. Doreatha Martin evaluate for possible surgical intervention on Monday.   Assessment & Plan:   Principal Problem:   Closed fracture of distal end of left femur, unspecified fracture morphology, initial encounter ([place]) Active Problems:   Chronic combined systolic and diastolic CHF (congestive heart failure) (HCC)   Hyperlipidemia associated with type 2 diabetes mellitus (HCC)   Extensive stage primary small cell carcinoma of lung (HCC)   Goals of care, counseling/discussion   Type 2 diabetes mellitus with hyperglycemia, with long-term current use of insulin (HCC)   History of pulmonary embolism   Benign prostatic hyperplasia  Closed fracture of distal end of left femur, unspecified fracture morphology, initial encounter ([place]) -Patient presenting status post what he reports as a mechanical fall with left hip and thigh pain -Notable intertrochanteric left hip fracture just above the previously placed intramedullary nail (10/2020) which was performed by Dr. Doreatha Martin -ER provider discussed case with Dr. Ronnie Derby, on-call for orthopedic surgery who stated that they felt that the patient was not an appropriate candidate for surgical intervention but recommended that we reach out directly to Dr. Doreatha Martin to discuss further -ER provider has attempted to reach Dr. Doreatha Martin but since he is not on-call attempt has been unsuccessful -Dr. Ronnie Derby has made Dr. Doreatha Martin aware -WBC went from 14.3 -> 14.2 -In the meantime, will provide patient with as needed analgesics for substantial associated pain -Since no intervention is happening today we will change the patient from n.p.o. to give him a diet -We will also add enoxaparin subcu for DVT prophylaxis   Extensive stage primary small cell  carcinoma of lung (HCC) -Advanced small cell lung cancer diagnosed 12/2019 -Known metastatic disease to the brain -Patient is no longer receiving chemotherapy and is currently on hospice -Has been following with Dr. Julien Nordmann as an  outpatient the past and will notify him of the patient's hospitalization as a courtesy via epic    Goals of care, counseling/discussion -Patient suffers from multiple advanced medical problems including advanced cardiomyopathy and metastatic small cell lung cancer -Overall prognosis is extremely poor -Patient is currently receiving hospice services with [facility] -It has been confirmed that patient remains DNR -That being said, patient still wishes to pursue some sort of curative intervention for his left hip fracture as patient was ambulating with a cane or walker just prior to the fall; palliative care has been consulted for further goals of care discussion   Chronic combined systolic and diastolic CHF (congestive heart failure) ([place]) CAD Ischemic cardiomyopathy Status post AICD placement -No evidence of cardiogenic volume overload at this time. -Strict I's and O's and Daily Weights  -We will consult cardiology for further evaluation and for preoperative clearance -We will resume the patient's statin and furosemide   Hypomagnesemia -Mag Level was 1.6 -Replete with IV Mag Sulfate 2 grams -Continue to Monitor and Replete as Necessary -Repeat Mag Level in the AM   Normocytic Anemia -Patient's Hgb/Hct went from 12.1/39.5 -> 11.7/39.0 -Check Anemia Panel  -Continue to Monitor for S/Sx of Bleeding; No overt bleeding noted -Repeat CBC in the AM  CKD Stage 3a -Patient's BUN/Cr went from 19/1.62 -> 23/1.50 -Avoid Nephrotoxic Medications, Contrast Dyes, Hypotension and Renally Adjust Medications -Repeat CMP in the AM  Hyperbilirubinemia -Patient's T Bili went from 2.3 -> 2.5 -Likely Reactive -Continue to Monitor and Replete as Necessary -Repeat CMP in the AM  Type 2 diabetes mellitus with hyperglycemia, with long-term current use of insulin ([place]) -Patient been placed on Accu-Cheks before every meal and nightly with sliding scale insulin that is a moderate NovoLog  intensity -Continuing home regimen of basal insulin therapy reduce the dose from 25 units SQ twice daily to 15 units SQ twice daily -Hemoglobin A1C ordered -Heart Healthy Diabetic Diet    History of pulmonary embolism -Bilateral pulmonary emboli diagnosed in 02/2021 -Of note, patient additionally has a remote history of DVT -Patient was on subcutaneous Lovenox after his hospitalization in May however this has since been discontinued since the patient has been entered into hospice care. -Patient currently not exhibiting any shortness of breath or oxygen requirement   Hyperlipidemia associated with type 2 diabetes mellitus ([place]) -Continuing home regimen of lipid lowering therapy with atorvastatin 20 mg p.o. nightly     Benign prostatic hyperplasia -Continue home regimen of tamsulosin 0.4 mg p.o. daily    DVT prophylaxis: Enoxaparin 40 mg sq q24h Code Status: FULL CODE Family Communication: No family present at bedside  Disposition Plan: Pending further clinical improvement and evaluation by Orthopedic Surgery and Cardiology   Status is: Inpatient  Remains inpatient appropriate because: Patient has a Hip Fracture that needs evaluation by Ortho and Clearance by Cardiology   Consultants:  Orthopedic Surgery  Procedures:  None  Antimicrobials:  Anti-infectives (From admission, onward)    None        Subjective: And examined at bedside states that he is still having some pain in his left hip.  No nausea or vomiting.  Denies any lightheadedness or dizziness.  No shortness of breath.  Feels okay.  No other concerns liquids at this time but did appear uncomfortable.  Objective: Vitals:   08/03/21 0945 08/03/21 1100 08/03/21 1342 08/03/21 1500  BP: 108/84 103/83 116/89 123/88  Pulse: (!) 115 (!) 118 (!) 115 (!) 118  Resp: (!) 26 (!) 23 18 18   Temp:    98.2 F (36.8 C)  TempSrc:    Oral  SpO2: 92% 100% 100% 94%   No intake or output data in the 24 hours ending 08/03/21  1654 There were no vitals filed for this visit.  Examination: Physical Exam:  Constitutional: Thin chronically ill-appearing African-American male currently in mild distress appears little uncomfortable Eyes:  Lids and conjunctivae normal, sclerae anicteric  ENMT: External Ears, Nose appear normal. Grossly normal hearing. Mucous membranes are moist.  Neck: Appears normal, supple, no cervical masses, normal ROM, no appreciable thyromegaly; no appreciable JVD Respiratory: Diminished to auscultation bilaterally, no wheezing, rales, rhonchi or crackles. Normal respiratory effort and patient is not tachypenic. No accessory muscle use.  Unlabored breathing Cardiovascular: Tachycardic rate but regular rhythm, no murmurs / rubs / gallops. S1 and S2 auscultated.  Abdomen: Soft, non-tender, non-distended. Bowel sounds positive.  GU: Deferred. Musculoskeletal: No clubbing / cyanosis of digits/nails.  He has left leg is externally rotated and shortened Skin: No rashes, lesions, ulcers on limited skin evaluation. No induration; Warm and dry.  Neurologic: CN 2-12 grossly intact with no focal deficits. Romberg sign and cerebellar reflexes not assessed.  Psychiatric: Normal judgment and insight. Alert and oriented x 3. Normal mood and appropriate affect.   Data Reviewed: I have personally reviewed following labs and imaging studies  CBC: Recent Labs  Lab 08/01/2021 1653 08/03/21 1000  WBC 14.3* 14.2*  NEUTROABS 11.8* 11.5*  HGB 12.1* 11.7*  HCT 39.5 39.0  MCV 95.0 96.5  PLT 134* 528*   Basic Metabolic Panel: Recent Labs  Lab 07/13/2021 1653 08/03/21 1000  NA 136 137  K 4.7 4.4  CL 103 103  CO2 22 23  GLUCOSE 262* 261*  BUN 19 23*  CREATININE 1.62* 1.50*  CALCIUM 8.7* 8.6*  MG  --  1.6*  PHOS  --  3.5   GFR: CrCl cannot be calculated (Unknown ideal weight.). Liver Function Tests: Recent Labs  Lab 07/22/2021 1653 08/03/21 1000  AST 17 15  ALT 14 14  ALKPHOS 118 107  BILITOT 2.3*  2.5*  PROT 6.0* 6.0*  ALBUMIN 2.7* 2.5*   No results for input(s): LIPASE, AMYLASE in the last 168 hours. No results for input(s): AMMONIA in the last 168 hours. Coagulation Profile: Recent Labs  Lab 07/16/2021 1653  INR 1.3*   Cardiac Enzymes: No results for input(s): CKTOTAL, CKMB, CKMBINDEX, TROPONINI in the last 168 hours. BNP (last 3 results) No results for input(s): PROBNP in the last 8760 hours. HbA1C: No results for input(s): HGBA1C in the last 72 hours. CBG: No results for input(s): GLUCAP in the last 168 hours. Lipid Profile: No results for input(s): CHOL, HDL, LDLCALC, TRIG, CHOLHDL, LDLDIRECT in the last 72 hours. Thyroid Function Tests: No results for input(s): TSH, T4TOTAL, FREET4, T3FREE, THYROIDAB in the last 72 hours. Anemia Panel: No results for input(s): VITAMINB12, FOLATE, FERRITIN, TIBC, IRON, RETICCTPCT in the last 72 hours. Sepsis Labs: No results for input(s): PROCALCITON, LATICACIDVEN in the last 168 hours.  Recent Results (from the past 240 hour(s))  Resp Panel by RT-PCR (Flu A&B, Covid) Nasopharyngeal Swab     Status: None   Collection Time: 08/11/2021  4:56 PM   Specimen: Nasopharyngeal Swab; Nasopharyngeal(NP) swabs in vial transport medium  Result Value  Ref Range Status   SARS Coronavirus 2 by RT PCR NEGATIVE NEGATIVE Final    Comment: (NOTE) SARS-CoV-2 target nucleic acids are NOT DETECTED.  The SARS-CoV-2 RNA is generally detectable in upper respiratory specimens during the acute phase of infection. The lowest concentration of SARS-CoV-2 viral copies this assay can detect is 138 copies/mL. A negative result does not preclude SARS-Cov-2 infection and should not be used as the sole basis for treatment or other patient management decisions. A negative result may occur with  improper specimen collection/handling, submission of specimen other than nasopharyngeal swab, presence of viral mutation(s) within the areas targeted by this assay, and  inadequate number of viral copies(<138 copies/mL). A negative result must be combined with clinical observations, patient history, and epidemiological information. The expected result is Negative.  Fact Sheet for Patients:  EntrepreneurPulse.com.au  Fact Sheet for Healthcare Providers:  IncredibleEmployment.be  This test is no t yet approved or cleared by the [place] FDA and  has been authorized for detection and/or diagnosis of SARS-CoV-2 by FDA under an Emergency Use Authorization (EUA). This EUA will remain  in effect (meaning this test can be used) for the duration of the COVID-19 declaration under Section 564(b)(1) of the Act, 21 U.S.C.section 360bbb-3(b)(1), unless the authorization is terminated  or revoked sooner.       Influenza A by PCR NEGATIVE NEGATIVE Final   Influenza B by PCR NEGATIVE NEGATIVE Final    Comment: (NOTE) The Xpert Xpress SARS-CoV-2/FLU/RSV plus assay is intended as an aid in the diagnosis of influenza from Nasopharyngeal swab specimens and should not be used as a sole basis for treatment. Nasal washings and aspirates are unacceptable for Xpert Xpress SARS-CoV-2/FLU/RSV testing.  Fact Sheet for Patients: EntrepreneurPulse.com.au  Fact Sheet for Healthcare Providers: IncredibleEmployment.be  This test is not yet approved or cleared by the [place] FDA and has been authorized for detection and/or diagnosis of SARS-CoV-2 by FDA under an Emergency Use Authorization (EUA). This EUA will remain in effect (meaning this test can be used) for the duration of the COVID-19 declaration under Section 564(b)(1) of the Act, 21 U.S.C. section 360bbb-3(b)(1), unless the authorization is terminated or revoked.  Performed at [facility], [place] [address], [place], [place] [address]     RN Pressure Injury Documentation:     Estimated body mass index is 23.6 kg/m as  calculated from the following:   Height as of 04/24/21: 6' (1.829 m).   Weight as of 04/24/21: 78.9 kg.  Malnutrition Type:   Malnutrition Characteristics:   Nutrition Interventions:    Radiology Studies: DG Femur Min 2 Views Left  Result Date: 08/04/2021 CLINICAL DATA:  Patient tripped and fell this morning. Left thigh injury with inability to bear weight. EXAM: LEFT FEMUR 2 VIEWS COMPARISON:  Radiographs 12/26/2020.  Pelvic CT 02/21/2021. FINDINGS: Status post left femoral intramedullary nail fixation with intact hardware and no screw loosening. The previously demonstrated distal diaphyseal femur fracture has healed with moderate posttraumatic deformity. There is a new acute, mildly comminuted intertrochanteric femur fracture. The femoral head is intact and located. The visualized left lower hemipelvis appears intact. IMPRESSION: 1. Comminuted intertrochanteric left femur fracture just above the femoral intramedullary nail. 2. Interval healing of distal diaphyseal femur fracture. Electronically Signed   By: Richardean Sale M.D.   On: 07/22/2021 17:48    Scheduled Meds:  Chlorhexidine Gluconate Cloth  6 each Topical Daily   enoxaparin (LOVENOX) injection  40 mg Subcutaneous Q24H   sodium chloride  flush  10-40 mL Intracatheter Q12H   Continuous Infusions:   LOS: 0 days   Kerney Elbe, DO [facility] PAGER is on AMION  If 7PM-7AM, please contact night-coverage [email or web address]

## 2021-08-03 NOTE — Assessment & Plan Note (Signed)
.   No evidence of cardiogenic volume overload at this time.

## 2021-08-03 NOTE — Assessment & Plan Note (Signed)
   Continue home regimen of Flomax 

## 2021-08-03 NOTE — Plan of Care (Signed)

## 2021-08-03 NOTE — Progress Notes (Signed)
[place] [id number] [facility]) Hospice hospital liaison RN note [name] is a current hospice patient with a terminal diagnosis of small cell lung cancer. Patient was in usual state of health at home, got up to go to bathroom and tripped and fell landing on his left hip. He was transported to the hospital and [facility] was notified after he got there. He was admitted to [facility] on 10.21 with diagnosis of femur fracture. Per Dr. Tomasa Hosteller with [facility] this is a related hospice admission.  Visited with Mr. Bagot at bedside while he was still in ED. He is intermittently awake as his nurse reports she has just given him something for pain. At this time patient is pending evaluation by othopedic department for possible surgical intervention for stabilization of his leg.  Patient is inpatient appropriate due to need for IV pain medication pending possible surgical intervention.  VS- T 98.2, HR 118, RR 18, BP 123/88, spO2 94% on 2L nasal cannula Intake/Output- none recorded Abnormal Labs- Glucose 261, BUN 23, Creatinine 1.50, Ca+ 8.6, Magnesium 1.6, Albumin 2.5, Total Protein 6, Total Bili 2.5, GFR 53, WBC 14.2, RBC 4.04, Hgb 11.7, Platelets 128 Diagnostics LEFT FEMUR 2 VIEWS   COMPARISON:  Radiographs 12/26/2020.  Pelvic CT 02/21/2021.   FINDINGS: Status post left femoral intramedullary nail fixation with intact hardware and no screw loosening. The previously demonstrated distal diaphyseal femur fracture has healed with moderate posttraumatic deformity. There is a new acute, mildly comminuted intertrochanteric femur fracture. The femoral head is intact and located. The visualized left lower hemipelvis appears intact.   IMPRESSION: 1. Comminuted intertrochanteric left femur fracture just above the femoral intramedullary nail. 2. Interval healing of distal diaphyseal femur fracture.   Problem List Closed fracture of distal end of left femur, unspecified fracture morphology, initial encounter  [facility]) -Patient presenting status post what he reports as a mechanical fall with left hip and thigh pain -Notable intertrochanteric left hip fracture just above the previously placed intramedullary nail (10/2020) which was performed by Dr. Doreatha Martin -ER provider discussed case with Dr. Ronnie Derby, on-call for orthopedic surgery who stated that they felt that the patient was not an appropriate candidate for surgical intervention but recommended that we reach out directly to Dr. Doreatha Martin to discuss further -ER provider has attempted to reach Dr. Doreatha Martin but since he is not on-call attempt has been unsuccessful -Dr. Ronnie Derby has made Dr. Doreatha Martin aware -WBC went from 14.3 -> 14.2 -In the meantime, will provide patient with as needed analgesics for substantial associated pain -Since no intervention is happening today we will change the patient from n.p.o. to give him a diet -We will also add enoxaparin subcu for DVT prophylaxis   Extensive stage primary small cell carcinoma of lung ([place]) -Advanced small cell lung cancer diagnosed 12/2019 -Known metastatic disease to the brain -Patient is no longer receiving chemotherapy and is currently on hospice -Has been following with Dr. Julien Nordmann as an outpatient the past and will notify him of the patient's hospitalization as a courtesy via epic    Goals of care, counseling/discussion -Patient suffers from multiple advanced medical problems including advanced cardiomyopathy and metastatic small cell lung cancer -Overall prognosis is extremely poor -Patient is currently receiving hospice services with [facility] -It has been confirmed that patient remains DNR -That being said, patient still wishes to pursue some sort of curative intervention for his left hip fracture as patient was ambulating with a cane or walker just prior to the fall; palliative care has been  consulted for further goals of care discussion   Chronic combined systolic and diastolic CHF (congestive heart  failure) ([place]) CAD Ischemic cardiomyopathy Status post AICD placement -No evidence of cardiogenic volume overload at this time. -Strict I's and O's and Daily Weights  -We will consult cardiology for further evaluation and for preoperative clearance -We will resume the patient's statin and furosemide   Hypomagnesemia -Mag Level was 1.6 -Replete with IV Mag Sulfate 2 grams -Continue to Monitor and Replete as Necessary -Repeat Mag Level in the AM    Normocytic Anemia -Patient's Hgb/Hct went from 12.1/39.5 -> 11.7/39.0 -Check Anemia Panel  -Continue to Monitor for S/Sx of Bleeding; No overt bleeding noted -Repeat CBC in the AM   CKD Stage 3a -Patient's BUN/Cr went from 19/1.62 -> 23/1.50 -Avoid Nephrotoxic Medications, Contrast Dyes, Hypotension and Renally Adjust Medications -Repeat CMP in the AM   Hyperbilirubinemia -Patient's T Bili went from 2.3 -> 2.5 -Likely Reactive -Continue to Monitor and Replete as Necessary -Repeat CMP in the AM   Type 2 diabetes mellitus with hyperglycemia, with long-term current use of insulin ([place]) -Patient been placed on Accu-Cheks before every meal and nightly with sliding scale insulin that is a moderate NovoLog intensity -Continuing home regimen of basal insulin therapy reduce the dose from 25 units SQ twice daily to 15 units SQ twice daily -Hemoglobin A1C ordered -Heart Healthy Diabetic Diet    History of pulmonary embolism -Bilateral pulmonary emboli diagnosed in 02/2021 -Of note, patient additionally has a remote history of DVT -Patient was on subcutaneous Lovenox after his hospitalization in May however this has since been discontinued since the patient has been entered into hospice care. -Patient currently not exhibiting any shortness of breath or oxygen requirement   Hyperlipidemia associated with type 2 diabetes mellitus ([place]) -Continuing home regimen of lipid lowering therapy with atorvastatin 20 mg p.o. nightly     Benign  prostatic hyperplasia -Continue home regimen of tamsulosin 0.4 mg p.o. daily   Goals of Care- Per patient to get his leg fixed so he can return home Family Communication- left message for patient's brother Discharge Planning-  Ongoing IDT- Updated  When patient is ready for discharge, please utilize [facility] for transportation as they contract this service for our current hospice patients.  Please don't hesitate to call with any hospice related questions or concerns.  Thank you,  Jhonnie Garner, BSN, RN, [facility] hospital liaison [phone]

## 2021-08-03 NOTE — Consult Note (Addendum)
NAME: Dennis [name]. 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 (Dx 12/2019) status post whole brain radiation and chemotherapy which has since been discontinued, systolic and diastolic congestive heart failure (Echo 02/2021 EF < 20%) status post AICD, coronary artery disease, history of cocaine abuse (last use 2017), right MCA stroke, hypertension, hyperlipidemia, insulin dependent diabetes mellitus type 2, bilateral pulmonary emboli   (02/2021 on Lovenox, now discontinued), benign prostatic hyperplasia who is currently enrolled in hospice who presents to [facility] emergency department status post fall with complaints of left thigh and hip pain.   Patient explains that at approximately 5:00 this morning he got up from bed to walk to the kitchen to get a glass of water.  Patient typically uses a walker to ambulate and unfortunately while having a glass of water in his hand tripped and fell, landing on his left hip.  Patient immediately began to experience severe left hip pain.  Pain is sharp in quality, worse with movement without radiation.  Patient denies any lightheadedness or loss of consciousness that precipitated his fall.   PAST MEDICAL HISTORY:   Past Medical History:  Diagnosis Date   Alcohol abuse with alcohol-induced mood disorder ([place]) 08/18/2015   Automatic implantable cardioverter-defibrillator in situ 2012   S/P St Jude Dual Chamber. ICD.   CAD (coronary artery disease) 11/16/2014   CAD Coronary angiography (12/2008) with mid LAD totally occluded and collateralized.   Cardiac LV ejection fraction 10-20%    Chest pain 09/04/2018   CHF (congestive heart failure) (HCC)    Chronic systolic heart failure ([place])    a) Mixed ICM/NICM b) RHC (05/2014): RA 2, RV 19/2/3, PA 22/14 (18), PCWP 6, Fick CO/CI: 5.2 / 2.7, PVR 2.3 WU, PA 60% and 64%  c) ECHO (05/2014): EF 20-25%, diff HK, akinesis entireanteroseptal myocardium, triv AI, mod MR, LA mod/sev dilated   Cocaine abuse ([place]) 01/24/2015   Cocaine abuse with cocaine-induced mood disorder ([place]) 08/20/2015   Drug abuse and dependence ([place])    Dysphagia following cerebral infarction 02/18/3266   Embolic stroke involving right middle cerebral artery (HCC)    Extensive stage primary small cell carcinoma of lung ([place]) 01/04/2020   H/O noncompliance with medical treatment, presenting hazards to health 01/24/2015   Heart murmur    High cholesterol    Ischemic cardiomyopathy    a) Coronary angiography (12/2008) at [facility]: Lmain: nl, LAD mid 100% stenosis with left to left and right-to-left collaterals to the distal LAD; Lcx: nl, RCA nl.     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[place])    MDD (major depressive disorder), recurrent severe, without psychosis ([place]) 08/18/2015   Open wound of foot 02/27/2015   Pneumonia 1988   Psychoactive substance-induced mood disorder ([place]) 07/17/2015   Sleep apnea    "cleared after T&A"   Status post PICC central line placement    Stroke [facility])    Substance induced mood disorder ([place]) 08/17/2015   Type II diabetes mellitus ([place])     PAST SURGICAL HISTORY:   Past Surgical History:  Procedure Laterality Date   BRONCHIAL BRUSHINGS  12/28/2019   Procedure: BRONCHIAL BRUSHINGS;  Surgeon: Garner Nash, DO;  Location: [place];  Service: Thoracic;;   BRONCHIAL NEEDLE ASPIRATION BIOPSY  12/28/2019   Procedure: BRONCHIAL NEEDLE ASPIRATION BIOPSIES;  Surgeon: June Leap  L, DO;  Location: [place];  Service: Thoracic;;   BRONCHIAL WASHINGS  12/28/2019   Procedure: BRONCHIAL WASHINGS;  Surgeon: Garner Nash, DO;  Location: [place];  Service: Thoracic;;   CARDIAC CATHETERIZATION  05/2014   CARDIAC DEFIBRILLATOR PLACEMENT  03/2011   CHOLECYSTECTOMY  1994   FEMUR IM NAIL Left 10/31/2020   Procedure: INTRAMEDULLARY (IM) RETROGRADE FEMORAL NAILING;  Surgeon:  Shona Needles, MD;  Location: [place];  Service: Orthopedics;  Laterality: Left;   FOREARM FRACTURE SURGERY Left 1980   FRACTURE SURGERY     IR IMAGING GUIDED PORT INSERTION  10/11/2020   RIGHT HEART CATHETERIZATION N/A 05/30/2014   Procedure: RIGHT HEART CATH;  Surgeon: Jolaine Artist, MD;  Location: [facility] CATH LAB;  Service: Cardiovascular;  Laterality: N/A;   RIGHT HEART CATHETERIZATION N/A 02/07/2015   Procedure: RIGHT HEART CATH;  Surgeon: Jolaine Artist, MD;  Location: [facility] CATH LAB;  Service: Cardiovascular;  Laterality: N/A;   TONSILLECTOMY AND ADENOIDECTOMY  ~ [place] N/A 12/28/2019   Procedure: VIDEO BRONCHOSCOPY WITH ENDOBRONCHIAL ULTRASOUND;  Surgeon: Garner Nash, DO;  Location: [facility] ENDOSCOPY;  Service: Thoracic;  Laterality: N/A;    MEDICATIONS:  (Not in a hospital admission)   ALLERGIES:  No Known Allergies  REVIEW OF SYSTEMS:   Musculoskeletal ROS: positive for - gait disturbance, pain in hip - left, and swelling in hip - left  FAMILY HISTORY:   Family History  Problem Relation Age of Onset   Diabetes Mother    Heart disease Mother    Diabetes Father    Prostate cancer Father    Heart disease Father    Diabetes Brother    Diabetes Brother     SOCIAL HISTORY:   reports that he has quit smoking. His smoking use included cigarettes. He has a 7.75 pack-year smoking history. He has never used smokeless tobacco. He reports that he does not currently use alcohol. He reports current drug use. Drug: Cocaine.  PHYSICAL EXAM:   Constitutional: Awake alert and oriented x3, patient is in distress due to ongoing left hip pain. Skin: no rashes, no lesions, good skin turgor noted. Eyes: Pupils are equally reactive to light.  No evidence of scleral icterus or conjunctival pallor.  ENMT: Moist mucous membranes noted.  Posterior pharynx clear of any exudate or lesions.   Neck: normal, supple, no masses, no thyromegaly.  No  evidence of jugular venous distension.   Respiratory: clear to auscultation bilaterally, no wheezing, no crackles. Normal respiratory effort. No accessory muscle use.  Cardiovascular: Regular rate and rhythm, no murmurs / rubs / gallops. No extremity edema. 2+ pedal pulses. No carotid bruits.  Chest:   Nontender without crepitus or deformity.   Back:   Nontender without crepitus or deformity. Abdomen: Abdomen is soft and nontender.  No evidence of intra-abdominal masses.  Positive bowel sounds noted in all quadrants.   Musculoskeletal: Outward rotation of the left foot noted.  Significant deformity noted of the lateral left hip.  Significant point tenderness over the left hip noted.  Severe pain with passive and active range of motion of the left hip joint.  no contractures. Normal muscle tone.  Neurologic: CN 2-12 grossly intact. Sensation intact.  Patient is unable to adequately move left lower extremity but otherwise patient is moving all other extremities spontaneously.  Patient is following all commands.  Patient is responsive to verbal stimuli.   Psychiatric: Patient exhibits normal mood with appropriate affect.  Patient seems to possess insight as to their current situation.     LABORATORY STUDIES: Recent Labs    07/28/2021 1653  WBC 14.3*  HGB 12.1*  HCT 39.5  PLT 134*     Recent Labs    07/31/2021 1653  NA 136  K 4.7  CL 103  CO2 22  GLUCOSE 262*  BUN 19  CREATININE 1.62*  CALCIUM 8.7*    STUDIES/RESULTS:  DG Femur Min 2 Views Left  Result Date: 07/27/2021 CLINICAL DATA:  Patient tripped and fell this morning. Left thigh injury with inability to bear weight. EXAM: LEFT FEMUR 2 VIEWS COMPARISON:  Radiographs 12/26/2020.  Pelvic CT 02/21/2021. FINDINGS: Status post left femoral intramedullary nail fixation with intact hardware and no screw loosening. The previously demonstrated distal diaphyseal femur fracture has healed with moderate posttraumatic deformity. There is a new  acute, mildly comminuted intertrochanteric femur fracture. The femoral head is intact and located. The visualized left lower hemipelvis appears intact. IMPRESSION: 1. Comminuted intertrochanteric left femur fracture just above the femoral intramedullary nail. 2. Interval healing of distal diaphyseal femur fracture. Electronically Signed   By: Richardean Sale M.D.   On: 07/18/2021 17:48    ASSESSMENT: Acute Left intertrochanteric hip fracture  PLAN: History of left retrograde femoral nail  10/31/20 by Dr Doreatha Martin with new left intertroch fracture. Dr Doreatha Martin made aware. Not a surgical candidate at this time. Admit and ortho will continue to follow. If optimized and stable, ortho to follow for possible surgery Monday    Zahari Xiang,STEPHEN D 08/03/2021. 8:20 AM

## 2021-08-03 NOTE — Assessment & Plan Note (Signed)
   Patient presenting status post what he reports as a mechanical fall with left hip and thigh pain  Notable intertrochanteric left hip fracture just above the previously placed intramedullary nail (10/2020) which was performed by Dr. Doreatha Martin  ER provider discussed case with Dr. Gloriann Loan, on-call for orthopedic surgery who stated that they felt that the patient was not an appropriate candidate for surgical intervention but recommended that we reach out directly to Dr. Doreatha Martin to discuss further  ER provider has attempted to reach Dr. Doreatha Martin but since he is not on-call attempt has been unsuccessful  Will attempt to reach Dr. Doreatha Martin or one of their associates tomorrow morning to reevaluate any therapeutic options for this patient  In the meantime, will provide patient with as needed analgesics for substantial associated pain  While intervention in the morning is unlikely, will make patient n.p.o. after midnight.

## 2021-08-03 NOTE — Assessment & Plan Note (Signed)
.   Continuing home regimen of lipid lowering therapy.  

## 2021-08-03 NOTE — Assessment & Plan Note (Signed)
.   Patient been placed on Accu-Cheks before every meal and nightly with sliding scale insulin . Continuing home regimen of basal insulin therapy . Hemoglobin A1C ordered . Diabetic Diet

## 2021-08-03 NOTE — Assessment & Plan Note (Signed)
   Patient suffers from multiple advanced medical problems including advanced cardiomyopathy and metastatic small cell lung cancer  Overall prognosis is extremely poor  Patient is currently receiving hospice services with [facility]  It has been confirmed that patient remains DNR  That being said, patient still wishes to pursue some sort of curative intervention for his left hip fracture as patient was ambulating with a cane or walker just prior to the fall.

## 2021-08-03 NOTE — TOC Initial Note (Signed)
Transition of Care (TOC) - Initial/Assessment Note    Patient Details  Name: Dennis [name]. MRN: [id number] Date of Birth: 12/04/1960  Transition of Care [facility]) CM/SW Contact:    Dennis Carmine, RN Phone Number: 08/03/2021, 10:37 AM  Clinical Narrative:                 60 year old patient with a history of lung cancer with metastasis, current;y pn hospice care at home with [facility], presented to the ED after a fall, where he was found to have a fx femur. Patent had previous orthopedic surgery with Dr Doreatha Martin. Currently reviewed by orthopedics, They will await Dr Doreatha Martin recommendation on Monday for potential surgical intervention. He is currently in the ED awaiting bed on orthopedics. No surgical intervention scheduled at this time. Will make sure [facility] is aware of his status.  Expected Discharge Plan: Home w Hospice Care Barriers to Discharge: Continued Medical Work up   Patient Goals and CMS Choice        Expected Discharge Plan and Services Expected Discharge Plan: [place]   Discharge Planning Services: CM Consult   Living arrangements for the past 2 months: Single Family Home                   DME Agency:  ([place])                  Prior Living Arrangements/Services Living arrangements for the past 2 months: [place] Lives with:: Relatives Patient language and need for interpreter reviewed:: Yes        Need for Family Participation in Patient Care: Yes (Comment) Care giver support system in place?: Yes (comment)   Criminal Activity/Legal Involvement Pertinent to Current Situation/Hospitalization: No - Comment as needed  Activities of Daily Living      Permission Sought/Granted                  Emotional Assessment       Orientation: : Oriented to Self, Oriented to Place   Psych Involvement: No (comment)  Admission diagnosis:  Closed fracture of distal end of left femur, unspecified fracture  morphology, initial encounter ([place]) [S72.402A] Patient Active Problem List   Diagnosis Date Noted   History of pulmonary embolism 08/03/2021   Benign prostatic hyperplasia 08/03/2021   Closed fracture of distal end of left femur, unspecified fracture morphology, initial encounter ([place]) 08/01/2021   Hospice care patient 02/27/2021   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   Port-A-Cath in place 02/05/2021   Hypokalemia 01/16/2021   Acute exacerbation of CHF (congestive heart failure) ([place]) 12/25/2020   Femoral distal fracture ([place]) 10/30/2020   Fall 10/30/2020   Brain metastases ([place]) 08/27/2020   Orthostatic hypotension    Syncope 07/09/2020   Slurred speech 07/08/2020   Type 2 diabetes mellitus with hyperglycemia, with long-term current use of insulin ([place]) 07/08/2020   Unsteady gait when walking 07/07/2020   Small cell lung cancer, right upper lobe ([place]) 01/19/2020   Encounter for antineoplastic chemotherapy 01/19/2020   Encounter for antineoplastic immunotherapy 01/19/2020   Extensive stage primary small cell carcinoma of lung ([place]) 01/04/2020   Goals of care, counseling/discussion 01/04/2020   Mediastinal adenopathy    Mass of upper lobe of right lung    Brain mass 12/23/2019   Vasogenic cerebral edema (HCC) 12/23/2019   Left leg paresthesias    History of CVA with residual deficit    Acute on chronic systolic congestive heart failure (  [place])    Left-sided weakness    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[place])    Cocaine abuse ([place]) 01/24/2015   Essential hypertension 01/24/2015   Dysphagia 01/24/2015   Left hemiplegia ([place]) 01/24/2015   Hyperlipidemia associated with type 2 diabetes mellitus ([place]) 11/30/2014   Chronic combined systolic and diastolic CHF (congestive heart failure) ([place]) 06/20/2014   Automatic implantable cardioverter-defibrillator in situ    Drug abuse and dependence ([place])    Cardiomyopathy, ischemic 07/21/2011   Type II diabetes mellitus with ophthalmic  manifestations ([place]) 09/13/2007   PCP:  [name], MD Pharmacy:   [facility], [place] [name] [place] [address] Phone: [phone] Fax: [phone]     Social Determinants of Health (SDOH) Interventions    Readmission Risk Interventions No flowsheet data found.

## 2021-08-04 ENCOUNTER — Inpatient Hospital Stay (HOSPITAL_COMMUNITY)

## 2021-08-04 ENCOUNTER — Encounter: Payer: Self-pay | Admitting: Internal Medicine

## 2021-08-04 DIAGNOSIS — I5020 Unspecified systolic (congestive) heart failure: Secondary | ICD-10-CM

## 2021-08-04 DIAGNOSIS — Z01810 Encounter for preprocedural cardiovascular examination: Secondary | ICD-10-CM | POA: Diagnosis not present

## 2021-08-04 DIAGNOSIS — C3490 Malignant neoplasm of unspecified part of unspecified bronchus or lung: Secondary | ICD-10-CM | POA: Diagnosis not present

## 2021-08-04 DIAGNOSIS — S72402A Unspecified fracture of lower end of left femur, initial encounter for closed fracture: Secondary | ICD-10-CM | POA: Diagnosis not present

## 2021-08-04 DIAGNOSIS — I255 Ischemic cardiomyopathy: Secondary | ICD-10-CM | POA: Diagnosis not present

## 2021-08-04 DIAGNOSIS — N400 Enlarged prostate without lower urinary tract symptoms: Secondary | ICD-10-CM | POA: Diagnosis not present

## 2021-08-04 DIAGNOSIS — I5042 Chronic combined systolic (congestive) and diastolic (congestive) heart failure: Secondary | ICD-10-CM | POA: Diagnosis not present

## 2021-08-04 DIAGNOSIS — I428 Other cardiomyopathies: Secondary | ICD-10-CM

## 2021-08-04 LAB — GLUCOSE, CAPILLARY
Glucose-Capillary: 356 mg/dL — ABNORMAL HIGH (ref 70–99)
Glucose-Capillary: 374 mg/dL — ABNORMAL HIGH (ref 70–99)
Glucose-Capillary: 455 mg/dL — ABNORMAL HIGH (ref 70–99)
Glucose-Capillary: 473 mg/dL — ABNORMAL HIGH (ref 70–99)

## 2021-08-04 LAB — CBC WITH DIFFERENTIAL/PLATELET
Abs Immature Granulocytes: 0.09 10*3/uL — ABNORMAL HIGH (ref 0.00–0.07)
Basophils Absolute: 0 10*3/uL (ref 0.0–0.1)
Basophils Relative: 0 %
Eosinophils Absolute: 0 10*3/uL (ref 0.0–0.5)
Eosinophils Relative: 0 %
HCT: 33.1 % — ABNORMAL LOW (ref 39.0–52.0)
Hemoglobin: 10 g/dL — ABNORMAL LOW (ref 13.0–17.0)
Immature Granulocytes: 1 %
Lymphocytes Relative: 6 %
Lymphs Abs: 0.8 10*3/uL (ref 0.7–4.0)
MCH: 28.7 pg (ref 26.0–34.0)
MCHC: 30.2 g/dL (ref 30.0–36.0)
MCV: 94.8 fL (ref 80.0–100.0)
Monocytes Absolute: 1.1 10*3/uL — ABNORMAL HIGH (ref 0.1–1.0)
Monocytes Relative: 9 %
Neutro Abs: 10.7 10*3/uL — ABNORMAL HIGH (ref 1.7–7.7)
Neutrophils Relative %: 84 %
Platelets: 143 10*3/uL — ABNORMAL LOW (ref 150–400)
RBC: 3.49 MIL/uL — ABNORMAL LOW (ref 4.22–5.81)
RDW: 16.7 % — ABNORMAL HIGH (ref 11.5–15.5)
WBC: 12.8 10*3/uL — ABNORMAL HIGH (ref 4.0–10.5)
nRBC: 0.2 % (ref 0.0–0.2)

## 2021-08-04 LAB — COMPREHENSIVE METABOLIC PANEL
ALT: 10 U/L (ref 0–44)
AST: 13 U/L — ABNORMAL LOW (ref 15–41)
Albumin: 2.1 g/dL — ABNORMAL LOW (ref 3.5–5.0)
Alkaline Phosphatase: 103 U/L (ref 38–126)
Anion gap: 10 (ref 5–15)
BUN: 26 mg/dL — ABNORMAL HIGH (ref 6–20)
CO2: 24 mmol/L (ref 22–32)
Calcium: 8.2 mg/dL — ABNORMAL LOW (ref 8.9–10.3)
Chloride: 98 mmol/L (ref 98–111)
Creatinine, Ser: 1.46 mg/dL — ABNORMAL HIGH (ref 0.61–1.24)
GFR, Estimated: 55 mL/min — ABNORMAL LOW (ref >60)
Glucose, Bld: 375 mg/dL — ABNORMAL HIGH (ref 70–99)
Potassium: 4.5 mmol/L (ref 3.5–5.1)
Sodium: 132 mmol/L — ABNORMAL LOW (ref 135–145)
Total Bilirubin: 2.1 mg/dL — ABNORMAL HIGH (ref 0.3–1.2)
Total Protein: 5.4 g/dL — ABNORMAL LOW (ref 6.5–8.1)

## 2021-08-04 LAB — MAGNESIUM: Magnesium: 2.2 mg/dL (ref 1.7–2.4)

## 2021-08-04 LAB — PHOSPHORUS: Phosphorus: 3.3 mg/dL (ref 2.5–4.6)

## 2021-08-04 LAB — BRAIN NATRIURETIC PEPTIDE: B Natriuretic Peptide: 1645.9 pg/mL — ABNORMAL HIGH (ref 0.0–100.0)

## 2021-08-04 IMAGING — DX DG CHEST 2V
3 series · 3 of 3 positions shown · non-contrast
Comparison: [DATE]

CLINICAL DATA: Congestive heart failure.  Diabetes.

EXAM:
CHEST - 2 VIEW

[chest ap (1 of 2)]
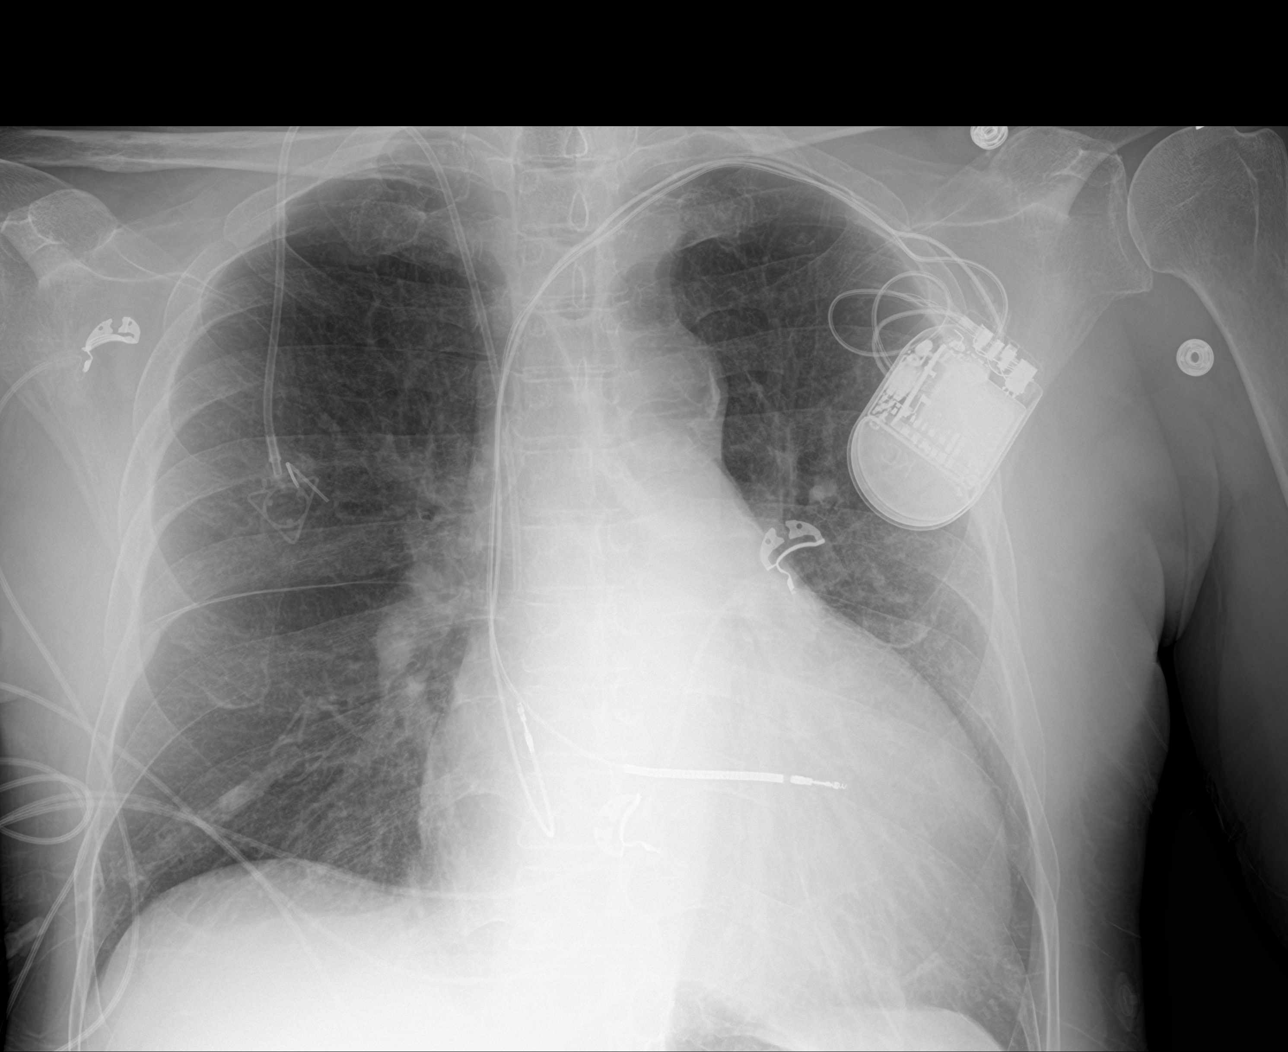

[chest lat]
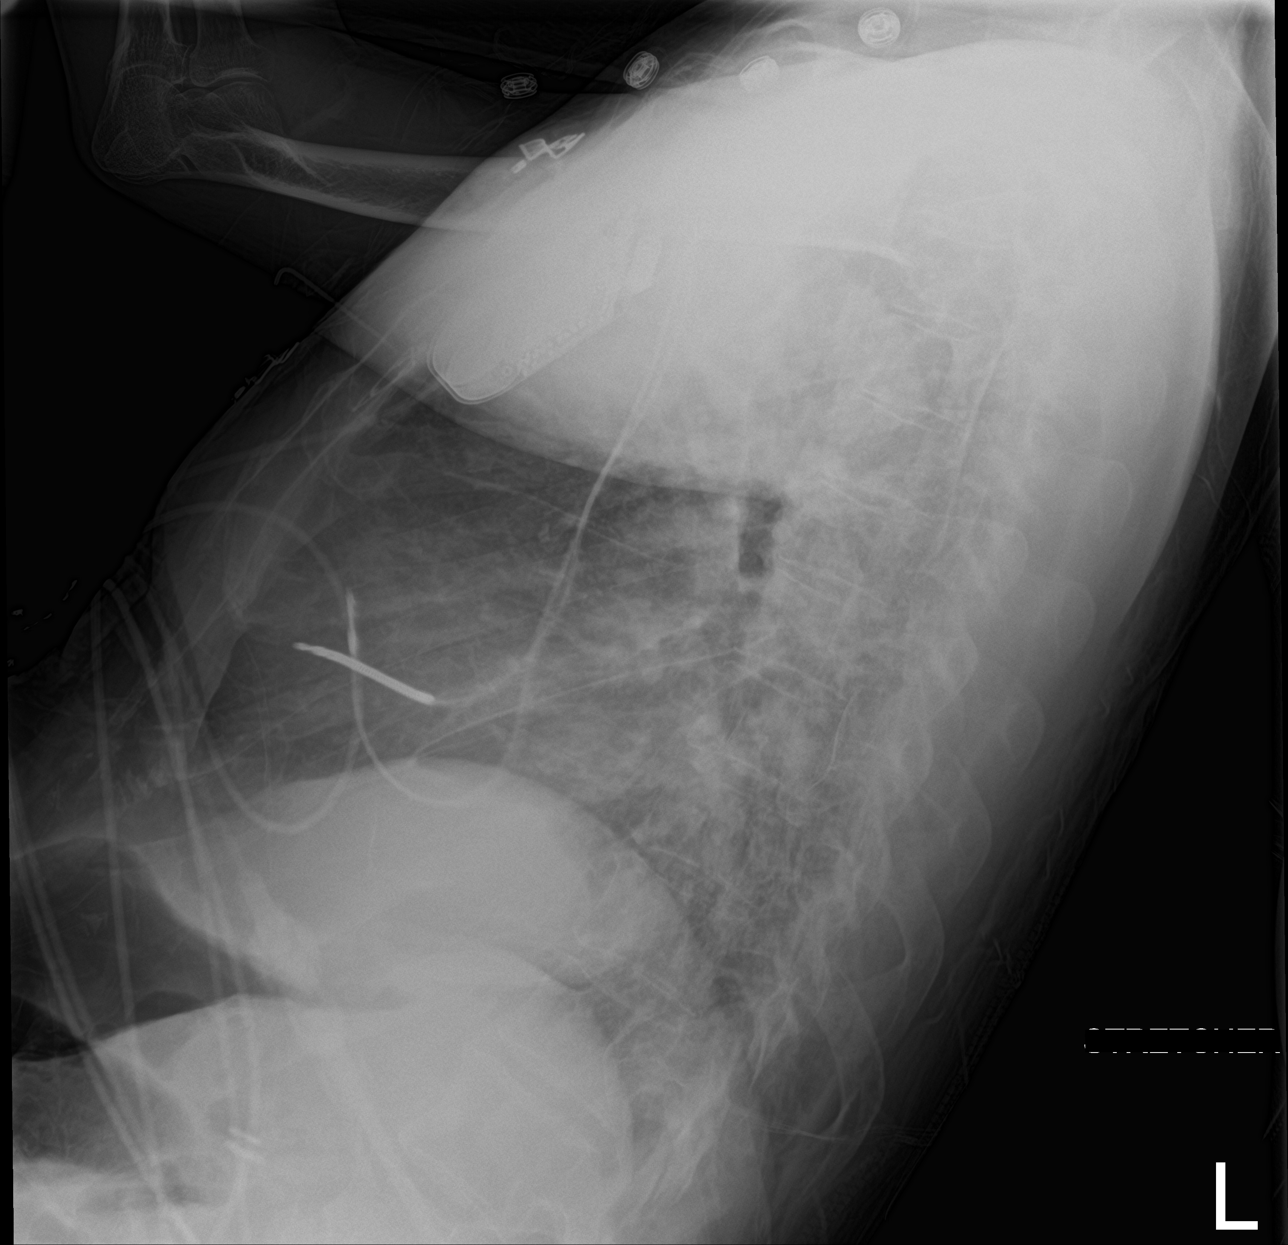

[chest ap (2 of 2)]
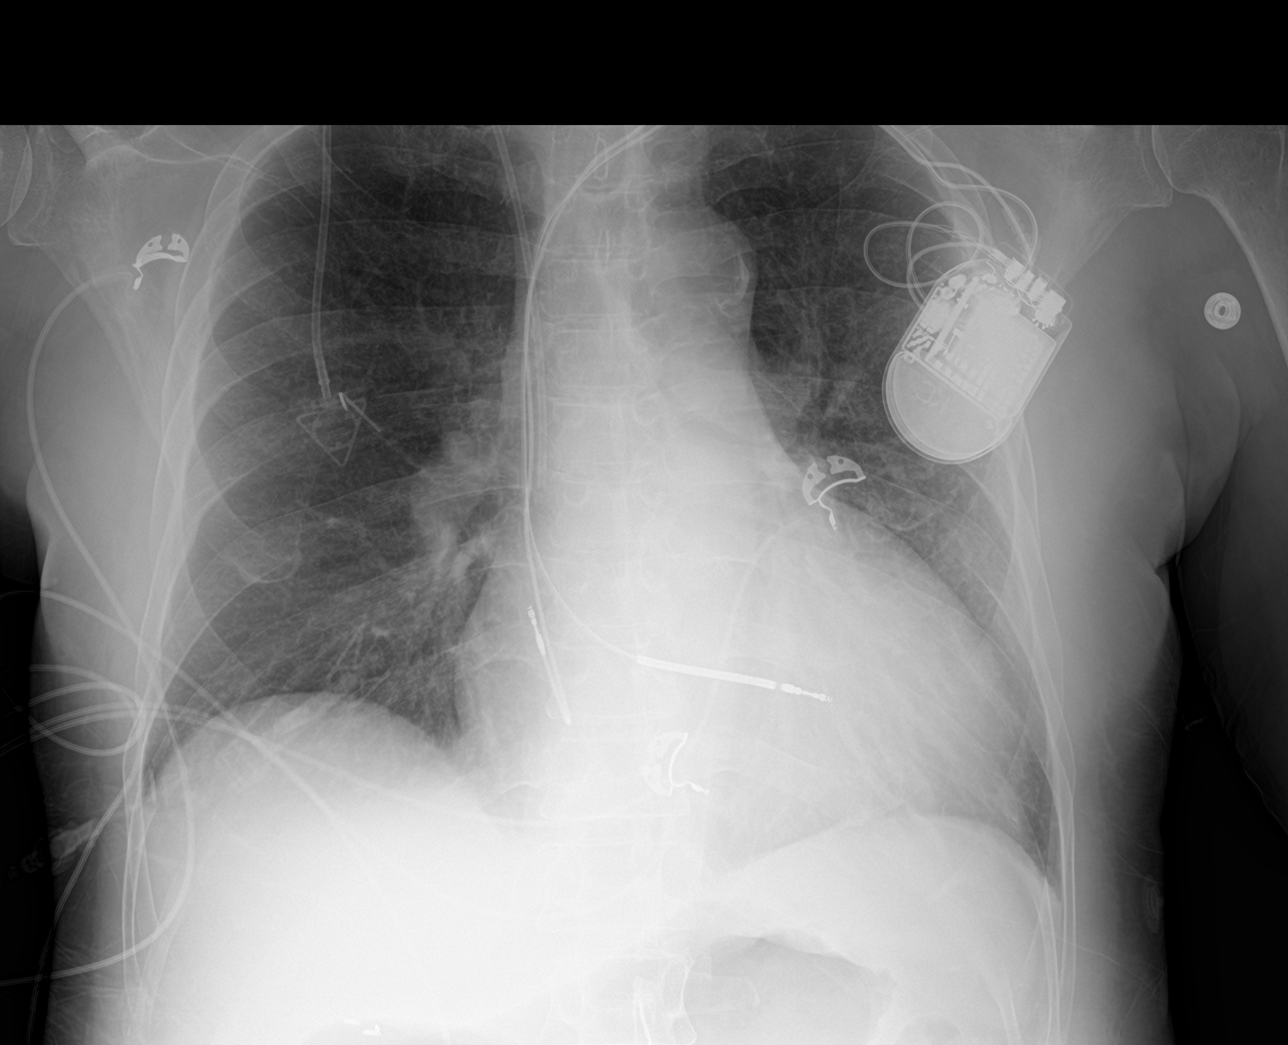

[3 of 3 positions shown; findings below may reference images not displayed]

FINDINGS: Cardiomegaly as seen previously. Aortic atherosclerosis.
Pacemaker/AICD in place. Power port in place from a right-sided
approach with tip in the SVC above the right atrium. Pulmonary edema
seen previously is not present today. Small amount of fluid in the
fissures. No infiltrate, collapse or measurable effusion.
IMPRESSION: Cardiomegaly and aortic atherosclerosis. Pacemaker/AICD. No
pulmonary edema today. Small amount of fluid in the fissures.

## 2021-08-04 MED ORDER — FUROSEMIDE 10 MG/ML IJ SOLN
60.0000 mg | Freq: Once | INTRAMUSCULAR | Status: AC
  Administered 2021-08-04: 60 mg via INTRAVENOUS
  Filled 2021-08-04: qty 6

## 2021-08-04 MED ORDER — INSULIN ASPART 100 UNIT/ML IJ SOLN
8.0000 [IU] | Freq: Once | INTRAMUSCULAR | Status: AC
  Administered 2021-08-04: 8 [IU] via SUBCUTANEOUS

## 2021-08-04 MED ORDER — FUROSEMIDE 10 MG/ML IJ SOLN: 40.0000 mg | Freq: Once | INTRAMUSCULAR | Status: DC

## 2021-08-04 NOTE — Progress Notes (Signed)
Patients'  HR is 109. Patient's MEWS score is Green. Patient is resting at this time. No complaint.Will continue to monitor

## 2021-08-04 NOTE — Progress Notes (Signed)
Patient's HR of 114 triggered a Yellow MEWS. Patient rated his pain to be  10/10. Pain med given. RN notified On-call provider about pain med. No responds. RN will continue to monitor.

## 2021-08-04 NOTE — Progress Notes (Signed)
PROGRESS NOTE    Dennis [name].  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  Patient stated that approximately around 5 AM he was in his normal state of health when he got up the bed to the kitchen to get a glass of water.  He typically uses a walker to ambulate unfortunately while having aggressive water in his hand he dropped a and fell on the water I was on the floor landing on his left hip immediately.  Patient developed severe left hip pain and described it as sharp in quality and worse with movement without any radiation.  He denies any lightheadedness or dizziness or loss of consciousness prior to his fall.  Because of his severe pain that persisted throughout the day and inability to ambulate the patient's brother brought patient's mother to [facility] for further evaluation.  In the ED he underwent imaging of the left femur which revealed an intertrochanteric left femur fracture just above the femoral intramedullary nail.  The ED provider discussed with Dr. Ronnie Derby who felt that the patient was not an appropriate surgical candidate at this time but recommended being stabilized and having orthopedic trauma  surgery Dr. Doreatha Martin evaluate for possible surgical intervention on Monday.  Cardiology has been consulted for preoperative evaluation. Patient is a High risk for general Anesthesia and cardiology is recommending continuing Lasix 20 mg p.o. daily.  We will be obtaining a chest x-ray. Still awaiting Trauma Ortho Evaluation by Dr. Doreatha Martin.   Assessment & Plan:   Principal Problem:   Closed fracture of distal end of left femur, unspecified fracture morphology, initial encounter ([place]) Active Problems:   Chronic combined systolic and diastolic CHF (congestive heart failure) (HCC)   Hyperlipidemia associated with type 2 diabetes mellitus (HCC)   Extensive stage primary small cell carcinoma of lung (HCC)   Goals of care, counseling/discussion   Type 2 diabetes mellitus with hyperglycemia, with long-term current use of insulin (HCC)   History of pulmonary embolism   Benign prostatic hyperplasia  Closed fracture of distal end of left femur, unspecified fracture morphology, initial encounter ([place]) -Patient presenting status post what he reports as a mechanical fall with left hip and thigh pain -Notable intertrochanteric left hip fracture just above the previously placed intramedullary nail (10/2020) which was performed by Dr. Doreatha Martin -ER provider discussed case with Dr. Ronnie Derby, on-call for orthopedic surgery who stated that they felt that the patient was not an appropriate candidate for surgical intervention but recommended that we reach out directly to Dr. Doreatha Martin to discuss further -ER provider has attempted to reach Dr. Doreatha Martin but since he is not on-call attempt has been unsuccessful -Dr. Ronnie Derby has made Dr. Doreatha Martin aware and Ortho evaluation still pending -WBC went from 14.3 -> 14.2 and  today it is 12.8 -In the meantime, will provide patient with as needed analgesics for substantial associated pain -Continue with diet for now and make him n.p.o. in anticipation for possible surgical procedure in the  morning -We will also add enoxaparin subcu for DVT prophylaxis   Extensive stage primary small cell carcinoma of lung (HCC) with metastasis to the brain -Advanced small cell lung cancer diagnosed 12/2019 -Known metastatic disease to the brain and he previously received radiation and chemotherapy -Patient is no longer receiving chemotherapy and is currently on hospice as he had a chest CT on 01/01/2021 which showed interval enlargement of a spiculated nodule consistent with worsening primary malignancy and newly enlarged paratracheal and subcarinal lymph nodes -Hospice is following and have added palliative care for further goals of care discussion -Has been following with Dr. Julien Nordmann as an outpatient the past and will notify him of the patient's hospitalization as a courtesy via epic    Goals of care, counseling/discussion -Patient suffers from multiple advanced medical problems including advanced cardiomyopathy and metastatic small cell lung cancer -Overall prognosis is extremely poor -Patient is currently receiving hospice services with [facility] -It has been confirmed that patient remains DNR -That being said, patient still wishes to pursue some sort of curative intervention for his left hip fracture as patient was ambulating with a cane or walker just prior to the fall; palliative care has been consulted for further goals of care discussion   Chronic combined systolic and diastolic CHF (congestive heart failure) with EF of 15% ([place]) CAD Ischemic cardiomyopathy and CAD with a mid LAD that is totally occluded and collateralized Status post AICD placement; this has been deactivated as he is currently under hospice -No evidence of cardiogenic volume overload at this time. -BNP was elevated at 1645.9 today -Patient had a cardiac cath in 12/2008 and a stress test 10/24/2017 with no ischemia noted -Strict I's and O's and Daily Weights: Weights not done accurately -The patient denies any cardiac  symptoms and denies any chest pain -We will consult cardiology for further evaluation and for preoperative clearance; continue with Lasix 20 g p.o. daily -GDMT is very limited in the setting of his hypotension -Cardiology recommends chest x-ray and this is in the process of being done -We will resume the patient's atorvastatin  Severe Mitral Regurgitation -Neurology been consulted and this was noted to be on his echocardiogram on 01/01/2021.   -He is felt to be a poor candidate with his history of lung cancer and there is no plans for MitraClip per cardiology  Hypomagnesemia -Mag Level was 1.6 and improved to 2.2 -Replete with IV Mag Sulfate 2 grams yesterday -Continue to Monitor and Replete as Necessary -Repeat Mag Level in the AM   Normocytic Anemia -Patient's Hgb/Hct went from 12.1/39.5 -> 11.7/39.0 -> 10.0/33.1 -Check Anemia Panel in the AM  -Continue to Monitor for S/Sx of Bleeding; No overt bleeding noted -Repeat CBC in the AM  CKD Stage 3a -Patient's BUN/Cr went from 19/1.62 -> 23/1.50 -> 26/1.46 -Avoid Nephrotoxic Medications, Contrast Dyes, Hypotension and Renally Adjust Medications -C/w Lasix 20 mg po Daily  -Repeat CMP in the AM  Hyponatremia -Mild. Na+ went from 136 -> 137 -> 132 -C/w po Lasix -Continue to Monitor and Trend -Repeat CMP in the AM   Hyperbilirubinemia -Patient's T Bili went from 2.3 -> 2.5 -> 12.1 -Likely Reactive -Continue to Monitor and Replete as Necessary -Repeat CMP in the AM  Type 2 diabetes mellitus with hyperglycemia, with long-term current use  of insulin ([place]) -Patient been placed on Accu-Cheks before every meal and nightly with sliding scale insulin that is a moderate NovoLog intensity -Continuing home regimen of basal insulin therapy reduce the dose from 25 units SQ twice daily to 15 units SQ twice daily -Hemoglobin A1C ordered and pending; Last HbA1c was 8.7 -Heart Healthy Diabetic Diet    History of pulmonary embolism -Bilateral  pulmonary emboli diagnosed in 02/2021 -Of note, patient additionally has a remote history of DVT -Patient was on subcutaneous Lovenox after his hospitalization in May however this has since been discontinued since the patient has been entered into hospice care. -Patient currently not exhibiting any shortness of breath or oxygen requirement     Hyperlipidemia associated with type 2 diabetes mellitus ([place]) -Continuing home regimen of lipid lowering therapy with atorvastatin 20 mg p.o. nightly   Benign prostatic hyperplasia -Continue home regimen of tamsulosin 0.4 mg p.o. daily    DVT prophylaxis: Enoxaparin 40 mg sq q24h Code Status: FULL CODE Family Communication: No family present at bedside  Disposition Plan: Pending further clinical improvement and evaluation by Orthopedic Surgery and Cardiology   Status is: Inpatient  Remains inpatient appropriate because: Patient has a Hip Fracture that needs evaluation by Ortho and Clearance by Cardiology   Consultants:  Orthopedic Surgery  Procedures:  None  Antimicrobials:  Anti-infectives (From admission, onward)    None        Subjective: Seen and examined at bedside he continues to have some pain in his left hip and describes it as an 8 out of 10.  Any chest pain or shortness of breath.  No nausea or vomiting.  Feels okay.  Wearing supplemental oxygen still.  No other concerns or complaints at this time.  Objective: Vitals:   08/04/21 0400 08/04/21 0403 08/04/21 0821 08/04/21 1028  BP: 123/83   112/86  Pulse: (!) 108 (!) 105  (!) 109  Resp: 19   20  Temp: 97.7 F (36.5 C)   97.8 F (36.6 C)  TempSrc: Oral   Oral  SpO2:  96% 97% 98%    Intake/Output Summary (Last 24 hours) at 08/04/2021 1240 Last data filed at 08/03/2021 1855 Gross per 24 hour  Intake 0 ml  Output --  Net 0 ml   There were no vitals filed for this visit.  Examination: Physical Exam:  Constitutional: Patient is a thin chronically ill-appearing  African-American male currently in no acute distress appears a little comfortable Eyes: Lids and conjunctivae normal, sclerae anicteric  ENMT: External Ears, Nose appear normal. Grossly normal hearing. Mucous membranes are moist.  Neck: Appears normal, supple, no cervical masses, normal ROM, no appreciable thyromegaly; no appreciable JVD Respiratory: Diminished to auscultation bilaterally with coarse breath sounds, no wheezing, rales, rhonchi or crackles. Normal respiratory effort and patient is not tachypenic. No accessory muscle use.  Wearing supplemental oxygen via nasal cannula Cardiovascular: Tachycardic rate but regular rhythm, no murmurs / rubs / gallops. S1 and S2 auscultated.  Mild edema Abdomen: Soft, non-tender, non-distended.  Bowel sounds positive.  GU: Deferred. Musculoskeletal: No clubbing / cyanosis of digits/nails. No joint deformity upper and lower extremities.  Skin: No rashes, lesions, ulcers on limited skin evaluation. No induration; Warm and dry.  Neurologic: CN 2-12 grossly intact with no focal deficits. Romberg sign and cerebellar reflexes not assessed.  Psychiatric: Normal judgment and insight. Alert and oriented x 3.  Slightly anxious mood and appropriate affect.   Data Reviewed: I have personally reviewed following labs and imaging  studies  CBC: Recent Labs  Lab 07/22/2021 1653 08/03/21 1000 08/04/21 0428  WBC 14.3* 14.2* 12.8*  NEUTROABS 11.8* 11.5* 10.7*  HGB 12.1* 11.7* 10.0*  HCT 39.5 39.0 33.1*  MCV 95.0 96.5 94.8  PLT 134* 128* 143*    Basic Metabolic Panel: Recent Labs  Lab 07/26/2021 1653 08/03/21 1000 08/04/21 0428  NA 136 137 132*  K 4.7 4.4 4.5  CL 103 103 98  CO2 22 23 24   GLUCOSE 262* 261* 375*  BUN 19 23* 26*  CREATININE 1.62* 1.50* 1.46*  CALCIUM 8.7* 8.6* 8.2*  MG  --  1.6* 2.2  PHOS  --  3.5 3.3    GFR: CrCl cannot be calculated (Unknown ideal weight.). Liver Function Tests: Recent Labs  Lab 08/12/2021 1653 08/03/21 1000  08/04/21 0428  AST 17 15 13*  ALT 14 14 10   ALKPHOS 118 107 103  BILITOT 2.3* 2.5* 2.1*  PROT 6.0* 6.0* 5.4*  ALBUMIN 2.7* 2.5* 2.1*    No results for input(s): LIPASE, AMYLASE in the last 168 hours. No results for input(s): AMMONIA in the last 168 hours. Coagulation Profile: Recent Labs  Lab 07/13/2021 1653  INR 1.3*    Cardiac Enzymes: No results for input(s): CKTOTAL, CKMB, CKMBINDEX, TROPONINI in the last 168 hours. BNP (last 3 results) No results for input(s): PROBNP in the last 8760 hours. HbA1C: No results for input(s): HGBA1C in the last 72 hours. CBG: No results for input(s): GLUCAP in the last 168 hours. Lipid Profile: No results for input(s): CHOL, HDL, LDLCALC, TRIG, CHOLHDL, LDLDIRECT in the last 72 hours. Thyroid Function Tests: No results for input(s): TSH, T4TOTAL, FREET4, T3FREE, THYROIDAB in the last 72 hours. Anemia Panel: No results for input(s): VITAMINB12, FOLATE, FERRITIN, TIBC, IRON, RETICCTPCT in the last 72 hours. Sepsis Labs: No results for input(s): PROCALCITON, LATICACIDVEN in the last 168 hours.  Recent Results (from the past 240 hour(s))  Resp Panel by RT-PCR (Flu A&B, Covid) Nasopharyngeal Swab     Status: None   Collection Time: 07/18/2021  4:56 PM   Specimen: Nasopharyngeal Swab; Nasopharyngeal(NP) swabs in vial transport medium  Result Value Ref Range Status   SARS Coronavirus 2 by RT PCR NEGATIVE NEGATIVE Final    Comment: (NOTE) SARS-CoV-2 target nucleic acids are NOT DETECTED.  The SARS-CoV-2 RNA is generally detectable in upper respiratory specimens during the acute phase of infection. The lowest concentration of SARS-CoV-2 viral copies this assay can detect is 138 copies/mL. A negative result does not preclude SARS-Cov-2 infection and should not be used as the sole basis for treatment or other patient management decisions. A negative result may occur with  improper specimen collection/handling, submission of specimen other than  nasopharyngeal swab, presence of viral mutation(s) within the areas targeted by this assay, and inadequate number of viral copies(<138 copies/mL). A negative result must be combined with clinical observations, patient history, and epidemiological information. The expected result is Negative.  Fact Sheet for Patients:  EntrepreneurPulse.com.au  Fact Sheet for Healthcare Providers:  IncredibleEmployment.be  This test is no t yet approved or cleared by the [place] FDA and  has been authorized for detection and/or diagnosis of SARS-CoV-2 by FDA under an Emergency Use Authorization (EUA). This EUA will remain  in effect (meaning this test can be used) for the duration of the COVID-19 declaration under Section 564(b)(1) of the Act, 21 U.S.C.section 360bbb-3(b)(1), unless the authorization is terminated  or revoked sooner.       Influenza A by PCR  NEGATIVE NEGATIVE Final   Influenza B by PCR NEGATIVE NEGATIVE Final    Comment: (NOTE) The Xpert Xpress SARS-CoV-2/FLU/RSV plus assay is intended as an aid in the diagnosis of influenza from Nasopharyngeal swab specimens and should not be used as a sole basis for treatment. Nasal washings and aspirates are unacceptable for Xpert Xpress SARS-CoV-2/FLU/RSV testing.  Fact Sheet for Patients: EntrepreneurPulse.com.au  Fact Sheet for Healthcare Providers: IncredibleEmployment.be  This test is not yet approved or cleared by the [place] FDA and has been authorized for detection and/or diagnosis of SARS-CoV-2 by FDA under an Emergency Use Authorization (EUA). This EUA will remain in effect (meaning this test can be used) for the duration of the COVID-19 declaration under Section 564(b)(1) of the Act, 21 U.S.C. section 360bbb-3(b)(1), unless the authorization is terminated or revoked.  Performed at [facility], [place] [address], [place], [place] [address]       RN Pressure Injury Documentation:     Estimated body mass index is 23.6 kg/m as calculated from the following:   Height as of 04/24/21: 6' (1.829 m).   Weight as of 04/24/21: 78.9 kg.  Malnutrition Type:   Malnutrition Characteristics:   Nutrition Interventions:    Radiology Studies: DG Femur Min 2 Views Left  Result Date: 07/17/2021 CLINICAL DATA:  Patient tripped and fell this morning. Left thigh injury with inability to bear weight. EXAM: LEFT FEMUR 2 VIEWS COMPARISON:  Radiographs 12/26/2020.  Pelvic CT 02/21/2021. FINDINGS: Status post left femoral intramedullary nail fixation with intact hardware and no screw loosening. The previously demonstrated distal diaphyseal femur fracture has healed with moderate posttraumatic deformity. There is a new acute, mildly comminuted intertrochanteric femur fracture. The femoral head is intact and located. The visualized left lower hemipelvis appears intact. IMPRESSION: 1. Comminuted intertrochanteric left femur fracture just above the femoral intramedullary nail. 2. Interval healing of distal diaphyseal femur fracture. Electronically Signed   By: Richardean Sale M.D.   On: 07/17/2021 17:48    Scheduled Meds:  atorvastatin  20 mg Oral QPM   Chlorhexidine Gluconate Cloth  6 each Topical Daily   cholecalciferol  2,000 Units Oral BID AC & HS   dexamethasone  1 mg Oral Daily   enoxaparin (LOVENOX) injection  40 mg Subcutaneous Q24H   furosemide  20 mg Oral Daily   insulin glargine-yfgn  15 Units Subcutaneous Daily   midodrine  10 mg Oral TID   mometasone-formoterol  2 puff Inhalation BID   sodium chloride flush  10-40 mL Intracatheter Q12H   tamsulosin  0.4 mg Oral Daily   umeclidinium bromide  1 puff Inhalation Daily   Continuous Infusions:   LOS: 1 day   Kerney Elbe, DO [facility] PAGER is on AMION  If 7PM-7AM, please contact night-coverage [email or web address]

## 2021-08-04 NOTE — Consult Note (Addendum)
Cardiology Consultation:   Patient ID: Dennis [name]. MRN: [id number]; DOB: [birth year]  Admit date: 08/10/2021 Date of Consult: 08/04/2021  PCP:  [name], MD   [facility] Providers Cardiologist:  Glori Bickers, MD   Patient Profile:   Dennis [name]. is a 60 y.o. 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 Alfredia Ferguson.  History of Present Illness:   [name] is a 60 year old male with complex history noted above.  He has been followed by Dr. Haroldine Laws in the advanced heart failure clinic.  He was admitted to [facility] 01/2015 with acute CVA with left-sided weakness.  He did not receive tPA due to delay in arrival.  Had an echo that showed an EF of 10 to 15% with mild RV hypokinesis, no clot.  He transferred to CIR but then developed hypotension and was transferred back for heart failure management.  Underwent right heart cath which showed filling pressures mildly elevated but cardiac output was okay.  Heart failure medications were limited in the setting of soft blood pressures.  He is only able to tolerate ivabradine 7.5 mg twice daily, digoxin and spironolactone 12.5.  Admitted November 2020 with chest pain.  Negative troponins and stress test which was high risk due to low EF but no ischemia.  He was diagnosed with lung cancer with brain metastasis in March 2021.  He received radiation and chemotherapy.  In October 2021 when he was admitted with syncope and falls.  Blood pressure was low so heart failure medications were stopped and he was started on midodrine.  He was seen by Dr. Lovena Le 09/2020 for follow-up of his ICD which had reached ERI with 3 months before EOL.  Decision was made for watchful  waiting and if the patient had response to chemotherapy then would consider replacing with another ICD at a later time.  Admitted to [facility] 10/2020 after falling on ice and sustained a left femur fracture.  He underwent repair but also incidentally tested positive for COVID-19 with no symptoms.  He was admitted to [facility] 12/2020 with increased dyspnea which was felt to be multifactorial in the setting of acute on chronic heart failure with COPD exacerbation.  He was found to have a left lower extremity DVT and was treated with Xarelto.  Chest CT angio showed no PE but did show interval enlargement of speculated nodule concerning for worsening primary malignancy with newly enlarged pretracheal.  Chronic appointments consistent with metastatic disease.  Echo showed severely reduced LVEF of 15% with severe MR, moderately reduced RV.  He was then admitted 02/2021 with bilateral PE and endocarditis of TV lead.  This was felt to be thrombus, cultures were negative.  Per ID antibiotics were stopped and he remained stable.  His chemotherapy was stopped, ICD was disabled and he was discharged with home hospice.  Last seen in the clinic on 04/24/2021 with Dr. Haroldine Laws, reported feeling okay with some dyspnea on exertion but overall stable.  He is continued on Lasix 20 mg daily, midodrine 10 mg 3 times daily for BP support, Xarelto, atorvastatin 20 mg daily.   Presented to the ED on 08/03/21 after a mechanical fall.  Denied any dizziness or lightheadedness, no LOC prior to fall.  In the ED he  was noted to have a intertrochanteric left hip fracture just above previously placed intramedullary nail from 10/2020.  Admission labs showed sodium 137, potassium 4.4, creatinine 1.5, albumin 2.5, mag 1.6, BNP 1645, WBC 14.2>> 12.8, hemoglobin 11.7>> 10.  EKG showed sinus tachycardia, 115 bpm, LVH, poor R wave progression.  He was admitted to internal medicine service.  Notes indicate case was discussed with on-call orthopedic  surgery who felt the patient would not be appropriate candidate for surgical intervention but recommended reaching out directly to Dr. Doreatha Martin. He has not been evaluated by ortho yet.   In talking him, he says he's been getting around his home ok with a walker. Complaint with his lasix, reports weights stable. Some LE edema but no shortness of breath. Receiving some pain meds but still having significant pain.    Past Medical History:  Diagnosis Date   Alcohol abuse with alcohol-induced mood disorder ([place]) 08/18/2015   Automatic implantable cardioverter-defibrillator in situ 2012   S/P St Jude Dual Chamber. ICD.   CAD (coronary artery disease) 11/16/2014   CAD Coronary angiography (12/2008) with mid LAD totally occluded and collateralized.   Cardiac LV ejection fraction 10-20%    Chest pain 09/04/2018   CHF (congestive heart failure) (HCC)    Chronic systolic heart failure ([place])    a) Mixed ICM/NICM b) RHC (05/2014): RA 2, RV 19/2/3, PA 22/14 (18), PCWP 6, Fick CO/CI: 5.2 / 2.7, PVR 2.3 WU, PA 60% and 64% c) ECHO (05/2014): EF 20-25%, diff HK, akinesis entireanteroseptal myocardium, triv AI, mod MR, LA mod/sev dilated   Cocaine abuse ([place]) 01/24/2015   Cocaine abuse with cocaine-induced mood disorder ([place]) 08/20/2015   Drug abuse and dependence ([place])    Dysphagia following cerebral infarction 6/33/3545   Embolic stroke involving right middle cerebral artery [facility])    Extensive stage primary small cell carcinoma of lung ([place]) 01/04/2020   H/O noncompliance with medical treatment, presenting hazards to health 01/24/2015   Heart murmur    High cholesterol    Ischemic cardiomyopathy    a) Coronary angiography (12/2008) at [facility]: Lmain: nl, LAD mid 100% stenosis with left to left and right-to-left collaterals to the distal LAD; Lcx: nl, RCA nl.     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[place])    MDD (major depressive disorder), recurrent severe, without psychosis ([place]) 08/18/2015   Open wound of foot 02/27/2015    Pneumonia 1988   Psychoactive substance-induced mood disorder ([place]) 07/17/2015   Sleep apnea    "cleared after T&A"   Status post PICC central line placement    Stroke [facility])    Substance induced mood disorder ([place]) 08/17/2015   Type II diabetes mellitus ([place])     Past Surgical History:  Procedure Laterality Date   BRONCHIAL BRUSHINGS  12/28/2019   Procedure: BRONCHIAL BRUSHINGS;  Surgeon: Garner Nash, DO;  Location: [place];  Service: Thoracic;;   BRONCHIAL NEEDLE ASPIRATION BIOPSY  12/28/2019   Procedure: BRONCHIAL NEEDLE ASPIRATION BIOPSIES;  Surgeon: Garner Nash, DO;  Location: [place];  Service: Thoracic;;   BRONCHIAL WASHINGS  12/28/2019   Procedure: BRONCHIAL WASHINGS;  Surgeon: Garner Nash, DO;  Location: [place];  Service: Thoracic;;   CARDIAC CATHETERIZATION  05/2014   CARDIAC DEFIBRILLATOR PLACEMENT  03/2011   CHOLECYSTECTOMY  1994   FEMUR IM NAIL Left 10/31/2020   Procedure: INTRAMEDULLARY (IM) RETROGRADE FEMORAL NAILING;  Surgeon: Shona Needles, MD;  Location: [place];  Service: Orthopedics;  Laterality: Left;   FOREARM FRACTURE SURGERY  Left 1980   FRACTURE SURGERY     IR IMAGING GUIDED PORT INSERTION  10/11/2020   RIGHT HEART CATHETERIZATION N/A 05/30/2014   Procedure: RIGHT HEART CATH;  Surgeon: Jolaine Artist, MD;  Location: [facility] CATH LAB;  Service: Cardiovascular;  Laterality: N/A;   RIGHT HEART CATHETERIZATION N/A 02/07/2015   Procedure: RIGHT HEART CATH;  Surgeon: Jolaine Artist, MD;  Location: [facility] CATH LAB;  Service: Cardiovascular;  Laterality: N/A;   TONSILLECTOMY AND ADENOIDECTOMY  ~ [place] N/A 12/28/2019   Procedure: VIDEO BRONCHOSCOPY WITH ENDOBRONCHIAL ULTRASOUND;  Surgeon: Garner Nash, DO;  Location: [facility] ENDOSCOPY;  Service: Thoracic;  Laterality: N/A;     Home Medications:  Prior to Admission medications   Medication Sig Start Date End Date Taking? Authorizing Provider   acetaminophen (TYLENOL) 325 MG tablet Take 2 tablets (650 mg total) by mouth every 6 (six) hours as needed for mild pain (or Fever >/= 101). 02/27/21  Yes Nicole Kindred A, DO  atorvastatin (LIPITOR) 20 MG tablet TAKE 1 TABLET BY MOUTH DAILY AT 6 PM. Patient taking differently: Take 20 mg by mouth every evening. 03/11/17  Yes Tymier Lindholm, Shaune Pascal, MD  Blood Glucose Monitoring Suppl (FIFTY50 GLUCOSE METER 2.0) w/Device KIT Check blood sugar 3 times a week 02/03/20  Yes [provider]  budesonide-formoterol (SYMBICORT) 160-4.5 MCG/ACT inhaler Inhale 2 puffs into the lungs 2 (two) times daily. 12/29/20  Yes [place] Donnamarie Poag, DO  cholecalciferol (VITAMIN D) 25 MCG (1000 UNIT) tablet Take 2,000 Units by mouth in the morning and at bedtime.   Yes [provider]  dexamethasone (DECADRON) 1 MG tablet Take 1 tablet (1 mg total) by mouth daily. 12/18/20  Yes Vaslow, Acey Lav, MD  furosemide (LASIX) 20 MG tablet Take 20 mg by mouth daily.   Yes [provider]  HYDROcodone-acetaminophen (NORCO/VICODIN) 5-325 MG tablet Take 1-2 tablets by mouth every 4 (four) hours as needed for moderate pain. 02/27/21  Yes Nicole Kindred A, DO  insulin glargine (LANTUS SOLOSTAR) 100 UNIT/ML Solostar Pen Inject 25 Units into the skin daily. Patient taking differently: Inject 25 Units into the skin 2 (two) times daily. 02/27/21  Yes Nicole Kindred A, DO  insulin lispro (HUMALOG KWIKPEN) 100 UNIT/ML KwikPen Inject 6 Units into the skin 3 (three) times daily. Patient taking differently: Inject 6 Units into the skin 2 (two) times daily. 07/25/20  Yes Eugenie Filler, MD  INSUPEN ULTRAFIN 31G X 8 MM MISC  03/02/20  Yes [provider]  methocarbamol (ROBAXIN) 500 MG tablet Take 1 tablet (500 mg total) by mouth every 6 (six) hours as needed for muscle spasms. 11/04/20  Yes Bonnielee Haff, MD  midodrine (PROAMATINE) 10 MG tablet Take 1 tablet (10 mg total) by mouth 3 (three) times daily with  meals. Patient taking differently: Take 10 mg by mouth 3 (three) times daily. 07/25/20  Yes Eugenie Filler, MD  Multiple Vitamin (MULTIVITAMIN) tablet Take 1 tablet by mouth daily.   Yes [provider]  potassium chloride SA (KLOR-CON) 20 MEQ tablet Take 20 mEq by mouth daily. 03/15/21  Yes [provider]  prochlorperazine (COMPAZINE) 10 MG tablet Take 10 mg by mouth every 6 (six) hours as needed for nausea or vomiting.   Yes [provider]  senna (SENOKOT) 8.6 MG TABS tablet Take 1 tablet (8.6 mg total) by mouth 2 (two) times daily. Patient taking differently: Take 1 tablet by mouth daily as needed for  mild constipation. 11/04/20  Yes Bonnielee Haff, MD  tamsulosin (FLOMAX) 0.4 MG CAPS capsule Take 0.4 mg by mouth daily. 10/22/20  Yes [provider]  tiotropium (SPIRIVA HANDIHALER) 18 MCG inhalation capsule Place 1 capsule (18 mcg total) into inhaler and inhale daily. 12/29/20 12/29/21 Yes [place] Eric J, DO  enoxaparin (LOVENOX) 80 MG/0.8ML injection Inject 0.75 mLs (75 mg total) into the skin every 12 (twelve) hours.**Pt must waste 0.5 mLs before injecting.** Patient not taking: No sig reported 05/01/21   Curt Bears, MD  feeding supplement (ENSURE ENLIVE / ENSURE PLUS) LIQD Take 237 mLs by mouth 2 (two) times daily between meals. Patient not taking: No sig reported 07/26/20   Eugenie Filler, MD    Inpatient Medications: Scheduled Meds:  atorvastatin  20 mg Oral QPM   Chlorhexidine Gluconate Cloth  6 each Topical Daily   cholecalciferol  2,000 Units Oral BID AC & HS   dexamethasone  1 mg Oral Daily   enoxaparin (LOVENOX) injection  40 mg Subcutaneous Q24H   furosemide  20 mg Oral Daily   insulin glargine-yfgn  15 Units Subcutaneous Daily   midodrine  10 mg Oral TID   mometasone-formoterol  2 puff Inhalation BID   sodium chloride flush  10-40 mL Intracatheter Q12H   tamsulosin  0.4 mg Oral Daily   umeclidinium bromide  1 puff Inhalation  Daily   Continuous Infusions:  PRN Meds: fentaNYL (SUBLIMAZE) injection **OR** fentaNYL (SUBLIMAZE) injection, sodium chloride flush  Allergies:   No Known Allergies  Social History:   Social History   Socioeconomic History   Marital status: Single    Spouse name: Not on file   Number of children: 1   Years of education: 14   Highest education level: Not on file  Occupational History   Occupation: Disabled  Tobacco Use   Smoking status: Former    Packs/day: 0.25    Years: 31.00    Pack years: 7.75    Types: Cigarettes   Smokeless tobacco: Never  Vaping Use   Vaping Use: Never used  Substance and Sexual Activity   Alcohol use: Not Currently    Alcohol/week: 0.0 standard drinks    Comment: 09/21/2014 "might have a drink q 6 /months, if that"   Drug use: Yes    Types: Cocaine    Comment: last use 07/06/2020   Sexual activity: Yes  Other Topics Concern   Not on file  Social History Narrative   Lives in [place] with his mother. No pets. Fun: movies, sports, daughter.    Denies religious beliefs that would effect health care.    Social Determinants of Health   Financial Resource Strain: Not on file  Food Insecurity: Not on file  Transportation Needs: Not on file  Physical Activity: Not on file  Stress: Not on file  Social Connections: Not on file  Intimate Partner Violence: Not on file    Family History:    Family History  Problem Relation Age of Onset   Diabetes Mother    Heart disease Mother    Diabetes Father    Prostate cancer Father    Heart disease Father    Diabetes Brother    Diabetes Brother      ROS:  Please see the history of present illness.   All other ROS reviewed and negative.     Physical Exam/Data:   Vitals:   08/03/21 2218 08/04/21 0400 08/04/21 0403 08/04/21 0821  BP: 113/77 123/83    Pulse: Marland Kitchen)  109 (!) 108 (!) 105   Resp: 18 19    Temp:  97.7 F (36.5 C)    TempSrc:  Oral    SpO2: 93%  96% 97%    Intake/Output Summary  (Last 24 hours) at 08/04/2021 1017 Last data filed at 08/03/2021 1855 Gross per 24 hour  Intake 0 ml  Output --  Net 0 ml   Last 3 Weights 04/24/2021 02/27/2021 02/26/2021  Weight (lbs) 174 lb 165 lb 9.1 oz 163 lb 5.8 oz  Weight (kg) 78.926 kg 75.1 kg 74.1 kg  Some encounter information is confidential and restricted. Go to Review Flowsheets activity to see all data.     General:  Well nourished, well developed, in no acute distress HEENT: normal Neck: no JVD @ 30 degrees Vascular: No carotid bruits; Distal pulses 2+ bilaterally Cardiac:  normal S1, S2; RRR; 3/6 LLSB murmur.  Lungs:  clear to auscultation bilaterally anteriorly. Port-a-cath to right upper chest. Abd: soft, nontender, no hepatomegaly  Ext: 1+ LLE edema Musculoskeletal:  Left leg externally rotated Skin: warm and dry  Neuro:  CNs 2-12 intact, no focal abnormalities noted Psych:  Normal affect   EKG:  The EKG was personally reviewed and demonstrates: sinus tachycardia, 115 bpm, LVH, poor R wave progression Telemetry:  Telemetry was personally reviewed and demonstrates:  ST   Relevant CV Studies:  Echo: 02/2021  IMPRESSIONS     1. Left ventricular ejection fraction, by estimation, is <20%. The left  ventricle has severely decreased function. The left ventricle demonstrates  global hypokinesis. The left ventricular internal cavity size was mildly  dilated. There is mild left  ventricular hypertrophy. Left ventricular diastolic parameters are  indeterminate.   2. Right ventricular systolic function is moderately reduced. The right  ventricular size is mildly enlarged. Tricuspid regurgitation signal is  inadequate for assessing PA pressure.   3. Left atrial size was mildly dilated.   4. The mitral valve is normal in structure. Trivial mitral valve  regurgitation. No evidence of mitral stenosis.   5. The tricuspid valve is abnormal. There is a 2.4 x 1.4 cm vegetation  that appears to be attached to the tricuspid  valve as well as vegetation  attached to the ICD lead as it transits the tricuspid valve. The  vegetation is not completely visualized on  this study, but I think this looks more like endocarditis than a sterile  thrombus in transit. Would followup with TEE.   6. The aortic valve is tricuspid. Aortic valve regurgitation is trivial.   7. Aortic dilatation noted. There is mild dilatation of the aortic root,  measuring 37 mm.   8. The inferior vena cava is normal in size with greater than 50%  respiratory variability, suggesting right atrial pressure of 3 mmHg.   FINDINGS   Left Ventricle: Left ventricular ejection fraction, by estimation, is  <20%. The left ventricle has severely decreased function. The left  ventricle demonstrates global hypokinesis. The left ventricular internal  cavity size was mildly dilated. There is  mild left ventricular hypertrophy. Left ventricular diastolic parameters  are indeterminate.   Right Ventricle: The right ventricular size is mildly enlarged. No  increase in right ventricular wall thickness. Right ventricular systolic  function is moderately reduced. Tricuspid regurgitation signal is  inadequate for assessing PA pressure.   Left Atrium: Left atrial size was mildly dilated.   Right Atrium: Right atrial size was normal in size.   Pericardium: There is no evidence of pericardial effusion.  Mitral Valve: The mitral valve is normal in structure. Trivial mitral  valve regurgitation. No evidence of mitral valve stenosis.   Tricuspid Valve: The tricuspid valve is abnormal. Tricuspid valve  regurgitation is trivial.   Aortic Valve: The aortic valve is tricuspid. Aortic valve regurgitation is  trivial. Aortic valve mean gradient measures 2.0 mmHg. Aortic valve peak  gradient measures 3.3 mmHg. Aortic valve area, by VTI measures 2.18 cm.   Pulmonic Valve: The pulmonic valve was normal in structure. Pulmonic valve  regurgitation is not visualized.    Aorta: Aortic dilatation noted. There is mild dilatation of the aortic  root, measuring 37 mm.   Venous: The inferior vena cava is normal in size with greater than 50%  respiratory variability, suggesting right atrial pressure of 3 mmHg.   IAS/Shunts: No atrial level shunt detected by color flow Doppler.   Additional Comments: A device lead is visualized in the right ventricle.   Laboratory Data:  High Sensitivity Troponin:  No results for input(s): TROPONINIHS in the last 720 hours.   Chemistry Recent Labs  Lab 08/11/2021 1653 08/03/21 1000 08/04/21 0428  NA 136 137 132*  K 4.7 4.4 4.5  CL 103 103 98  CO2 '22 23 24  ' GLUCOSE 262* 261* 375*  BUN 19 23* 26*  CREATININE 1.62* 1.50* 1.46*  CALCIUM 8.7* 8.6* 8.2*  MG  --  1.6* 2.2  GFRNONAA 49* 53* 55*  ANIONGAP '11 11 10    ' Recent Labs  Lab 08/04/2021 1653 08/03/21 1000 08/04/21 0428  PROT 6.0* 6.0* 5.4*  ALBUMIN 2.7* 2.5* 2.1*  AST 17 15 13*  ALT '14 14 10  ' ALKPHOS 118 107 103  BILITOT 2.3* 2.5* 2.1*   Lipids No results for input(s): CHOL, TRIG, HDL, LABVLDL, LDLCALC, CHOLHDL in the last 168 hours.  Hematology Recent Labs  Lab 08/07/2021 1653 08/03/21 1000 08/04/21 0428  WBC 14.3* 14.2* 12.8*  RBC 4.16* 4.04* 3.49*  HGB 12.1* 11.7* 10.0*  HCT 39.5 39.0 33.1*  MCV 95.0 96.5 94.8  MCH 29.1 29.0 28.7  MCHC 30.6 30.0 30.2  RDW 16.5* 16.5* 16.7*  PLT 134* 128* 143*   Thyroid No results for input(s): TSH, FREET4 in the last 168 hours.  BNP Recent Labs  Lab 08/04/21 0428  BNP 1,645.9*    DDimer No results for input(s): DDIMER in the last 168 hours.   Radiology/Studies:  DG Femur Min 2 Views Left  Result Date: 07/29/2021 CLINICAL DATA:  Patient tripped and fell this morning. Left thigh injury with inability to bear weight. EXAM: LEFT FEMUR 2 VIEWS COMPARISON:  Radiographs 12/26/2020.  Pelvic CT 02/21/2021. FINDINGS: Status post left femoral intramedullary nail fixation with intact hardware and no screw  loosening. The previously demonstrated distal diaphyseal femur fracture has healed with moderate posttraumatic deformity. There is a new acute, mildly comminuted intertrochanteric femur fracture. The femoral head is intact and located. The visualized left lower hemipelvis appears intact. IMPRESSION: 1. Comminuted intertrochanteric left femur fracture just above the femoral intramedullary nail. 2. Interval healing of distal diaphyseal femur fracture. Electronically Signed   By: Richardean Sale M.D.   On: 08/01/2021 17:48     Assessment and Plan:   Oleg Krue Peterka. is a 60 y.o. 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 Alfredia Ferguson.  Fall with intertrochanteric left hip fracture: after mechanical fall. Given his extensive cardiac history, cancer and severe MR he is clearly at high risk for general anesthesia.  Patient is adamant he would like to undergo intervention as he was previously walking with walker. Question whether surgical intervention could be done with spinal block. He is pending ortho evaluation.  --Receiving pain management per primary  HFrEF mixed ICM/NICM: Echo 12/2018 10 with EF of 15%, RV moderately reduced, severe MR.  He has been very limited in GDMT in the setting of hypotension.  Has been tolerating Lasix 20 mg daily PTA. BNP 1645 on admission. Does not appear overtly volume overloaded on exam, warm and dry. Obtain CXR (not completed on admission) -- lasix 58m daily PO currently  S/p ICD: St Jude dual chamber. This had been deactivated as he is currently under hospice care.  Severe MR: Noted on echocardiogram 3/22.  Felt to be poor candidate with history of lung cancer, no plans for MitraClip  Metastatic lung  cancer with brain mets: Previously received radiation and chemotherapy.  Had a chest CT 3/22 showing interval enlargement of spiculated nodule consistent with worsening primary malignancy and newly enlarged pretracheal and subcarinal lymph nodes. --Chemotherapy has been stopped, he is now followed by hospice --Palliative care consulted via primary  Left lower extremity DVT: Previously been on Xarelto, and has been doing Lovenox shots PTA  CAD: Cath 12/2008 with mid LAD totally occluded, collateralized.  Stress test 09/2018 with no ischemia, prior infarct.  No anginal symptoms PTA  For questions or updates, please contact CPeters[place]Please consult [email or web address] for contact info under    Signed, LReino Bellis NP  08/04/2021 10:17 AM  Patient seen and examined with the above-signed Advanced Practice Provider and/or Housestaff. I personally reviewed laboratory data, imaging studies and relevant notes. I independently examined the patient and formulated the important aspects of the plan. I have edited the note to reflect any of my changes or salient points. I have personally discussed the plan with the patient and/or family.  60y/o male with metastatic lung cancer, advanced systolic HF due to mixed CM (LAD totally occluded), previous left femur fracture with IM nail in 1/22.  Now admitted with L hip fracture after mechanical fall.   At baseline gets around the house with his walker. Rhythms have been stable. No angina   General:  Sitting up in bed . No resp difficulty HEENT: normal Neck: supple. JVP 10 Carotids 2+ bilat; no bruits. No lymphadenopathy or thryomegaly appreciated. Cor: PMI laterally displaced. Regular tachy. 2/6 MR Lungs: clear Abdomen: soft, nontender, nondistended. No hepatosplenomegaly. No bruits or masses. Good bowel sounds. Extremities: no cyanosis, clubbing, rash, 1-2+  edema Neuro: alert & orientedx3, cranial nerves grossly intact. Unable to move left leg much  Affect pleasant  Overall he is relatively high risk for peri-op CV complications with surgery but has been functional from a HF standpoint and tolerated surgery I 14/16without complication. Would favor surgical repair of his hip fracture to preserve his mobility. He is very much interested in surgery as well and understands the risks.  Agree with trying to avoid GA if possible and operating under a block and conscious sedation, if possible.   On exam he is volume overloaded. Will give lasix 60 IV x1 and reassess. Place TED hose  DGlori Bickers MD  1:14 PM

## 2021-08-04 NOTE — Progress Notes (Addendum)
[place] [address] [facility]) Hospice hospital liaison RN note  [name] is a current hospice patient with a terminal diagnosis of small cell lung cancer. Patient was in usual state of health at home, got up to go to bathroom and tripped and fell landing on his left hip. He was transported to the hospital and [facility] was notified after he got there. He was admitted to [facility] on 10.21 with diagnosis of femur fracture. Per Dr. Tomasa Hosteller with [facility] this is a related hospice admission.   Visited at bedside. Pt enjoying his dinner during this visit, endorsed pain in LLE. Pt clear that he understood surgery held risks but wanted to attempt to repair fracture.  Spoke with pt's brother [name] by phone.  Patient is inpatient appropriate due to need for IV pain medication pending possible surgical intervention.   VS- T 97.8 oral, HR 109, RR 20, BP 112/86 (96), SpO2 98% on 2L nasal cannula  Intake/Output- not charted  Abnormal Labs- Na 132, Glu 375, BUN 26, Creat 1.46, Ca 8.2, Alb 2.1, AST 13, tot prot 5.4, GFR 55, BNP 1645, WBC 12.8, Hgb 10.0, platelets 143k Diagnostics EXAM: CHEST - 2 VIEW   COMPARISON:  12/28/2020   FINDINGS: Cardiomegaly as seen previously. Aortic atherosclerosis. Pacemaker/AICD in place. Power port in place from a right-sided approach with tip in the SVC above the right atrium. Pulmonary edema seen previously is not present today. Small amount of fluid in the fissures. No infiltrate, collapse or measurable effusion.   IMPRESSION: Cardiomegaly and aortic atherosclerosis. Pacemaker/AICD. No pulmonary edema today. Small amount of fluid in the fissures.    Problem List  Closed fracture of distal end of left femur, unspecified fracture morphology, initial encounter [facility]) -Patient presenting status post what he reports as a mechanical fall with left hip and thigh pain -Notable intertrochanteric left hip fracture just above the previously placed intramedullary nail  (10/2020) which was performed by Dr. Doreatha Martin -ER provider discussed case with Dr. Ronnie Derby, on-call for orthopedic surgery who stated that they felt that the patient was not an appropriate candidate for surgical intervention but recommended that we reach out directly to Dr. Doreatha Martin to discuss further -ER provider has attempted to reach Dr. Doreatha Martin but since he is not on-call attempt has been unsuccessful -Dr. Ronnie Derby has made Dr. Doreatha Martin aware and Ortho evaluation still pending -WBC went from 14.3 -> 14.2 and today it is 12.8 -In the meantime, will provide patient with as needed analgesics for substantial associated pain -Continue with diet for now and make him n.p.o. 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 Julien Nordmann as an outpatient the past and will notify him of the patient's hospitalization as a courtesy via epic    Goals of care, counseling/discussion -Patient suffers from multiple advanced medical problems including advanced cardiomyopathy and metastatic small cell lung cancer -Overall prognosis is extremely poor -Patient  is currently receiving hospice services with [facility] -It has been confirmed that patient remains DNR -That being said, patient still wishes to pursue some sort of curative intervention for his  left hip fracture as patient was ambulating with a cane or walker just prior to the fall; palliative care has been consulted for further goals of care discussion   Chronic combined systolic and diastolic CHF (congestive heart failure) with EF of 15% (HCC) CAD Ischemic cardiomyopathy and CAD with a mid LAD that is totally occluded and collateralized Status post AICD placement; this has been deactivated as he is currently under hospice -No evidence of cardiogenic volume overload at this time. -BNP was elevated at 1645.9 today -Patient had a cardiac cath in 12/2008 and a stress test 10/24/2017 with no ischemia noted -Strict I's and O's and Daily Weights: Weights not done accurately -The patient denies any cardiac symptoms and denies any chest pain -We will consult cardiology for further evaluation and for preoperative clearance; continue with Lasix 20 g p.o. daily -GDMT is very limited in the setting of his hypotension -Cardiology recommends chest x-ray and this is in the process of being done -We will resume the patient's atorvastatin   Severe Mitral Regurgitation -Neurology been consulted and this was noted to be on his echocardiogram on 01/01/2021.   -He is felt to be a poor candidate with his history of lung cancer and there is no plans for MitraClip per cardiology   Hypomagnesemia -Mag Level was 1.6 and improved to 2.2 -Replete with IV Mag Sulfate 2 grams yesterday -Continue to Monitor and Replete as Necessary -Repeat Mag Level in the AM    Normocytic Anemia -Patient's Hgb/Hct went from 12.1/39.5 -> 11.7/39.0 -> 10.0/33.1 -Check Anemia Panel in the AM  -Continue to Monitor for S/Sx of Bleeding; No overt bleeding noted -Repeat CBC in the AM   CKD Stage 3a -Patient's BUN/Cr went from 19/1.62 -> 23/1.50 -> 26/1.46 -Avoid Nephrotoxic Medications, Contrast Dyes, Hypotension and Renally Adjust Medications -C/w Lasix 20 mg po Daily  -Repeat CMP in the AM   Hyponatremia -Mild. Na+ went  from 136 -> 137 -> 132 -C/w po Lasix -Continue to Monitor and Trend -Repeat CMP in the AM    Hyperbilirubinemia -Patient's T Bili went from 2.3 -> 2.5 -> 12.1 -Likely Reactive -Continue to Monitor and Replete as Necessary -Repeat CMP in the AM   Type 2 diabetes mellitus with hyperglycemia, with long-term current use of insulin ([place]) -Patient been placed on Accu-Cheks before every meal and nightly with sliding scale insulin that is a moderate NovoLog intensity -Continuing home regimen of basal insulin therapy reduce the dose from 25 units SQ twice daily to 15 units SQ twice daily -Hemoglobin A1C ordered and pending; Last HbA1c was 8.7 -Heart Healthy Diabetic Diet    History of pulmonary embolism -Bilateral pulmonary emboli diagnosed in 02/2021 -Of note, patient additionally has a remote history of DVT -Patient was on subcutaneous Lovenox after his hospitalization in May however this has since been discontinued since the patient has been entered into hospice care. -Patient currently not exhibiting any shortness of breath or oxygen requirement   Goals of Care- Per patient to get his leg fixed so he can return home Family Communication- spoke with brother [name] by phone Discharge Planning-  Ongoing IDT- Updated   When patient is ready for discharge, please utilize [facility] for transportation as they contract this service for our current hospice patients.   Please don't hesitate to call with any hospice  related questions or concerns.   Thank you,   Domenic Moras, BSN, RN [facility] liaison [phone]

## 2021-08-04 NOTE — Progress Notes (Signed)
Ortho Trauma Note  I was made aware of Mr Alwin by Dr Ronnie Derby. I have made him NPO at midnight. I believe he would be best served by undergoing removal of his previous nail and cephalomedullary nailing of his hip fracture. Our team will formally consult tomorrow AM but I will tentatively post him for surgery tomorrow. He is high risk from a cardiac standpoint but I feel that he will be bedridden or nonbulatory for remainder of his l ife without surgery.  Shona Needles, MD [phone]

## 2021-08-04 NOTE — Progress Notes (Signed)
PT Cancellation Note  Patient Details Name: Dennis [name]. MRN: [id number] DOB: November 24, 1960   Cancelled Treatment:    Reason Eval/Treat Not Completed: Medical issues which prohibited therapy;Active bedrest order Pt currently on bedrest awaiting possible surgery for hip fx on Monday. Will follow up post op or once bedrest orders are discontinued.    Marguarite Arbour A Kaela Beitz 08/04/2021, 7:09 AM Marisa Severin, PT, DPT Acute Rehabilitation Services Pager [phone] Office [phone]

## 2021-08-04 NOTE — Plan of Care (Signed)

## 2021-08-05 ENCOUNTER — Inpatient Hospital Stay (HOSPITAL_COMMUNITY): Admitting: Anesthesiology

## 2021-08-05 ENCOUNTER — Encounter: Payer: Self-pay | Admitting: Internal Medicine

## 2021-08-05 ENCOUNTER — Encounter (HOSPITAL_COMMUNITY): Payer: Self-pay | Admitting: Internal Medicine

## 2021-08-05 ENCOUNTER — Encounter (HOSPITAL_COMMUNITY): Admission: EM | Disposition: E | Payer: Self-pay | Source: Home / Self Care | Attending: Internal Medicine

## 2021-08-05 ENCOUNTER — Inpatient Hospital Stay (HOSPITAL_COMMUNITY)

## 2021-08-05 DIAGNOSIS — I5042 Chronic combined systolic (congestive) and diastolic (congestive) heart failure: Secondary | ICD-10-CM | POA: Diagnosis not present

## 2021-08-05 DIAGNOSIS — I428 Other cardiomyopathies: Secondary | ICD-10-CM | POA: Diagnosis not present

## 2021-08-05 DIAGNOSIS — I255 Ischemic cardiomyopathy: Secondary | ICD-10-CM | POA: Diagnosis not present

## 2021-08-05 DIAGNOSIS — S72402A Unspecified fracture of lower end of left femur, initial encounter for closed fracture: Secondary | ICD-10-CM | POA: Diagnosis not present

## 2021-08-05 DIAGNOSIS — N400 Enlarged prostate without lower urinary tract symptoms: Secondary | ICD-10-CM | POA: Diagnosis not present

## 2021-08-05 DIAGNOSIS — I5020 Unspecified systolic (congestive) heart failure: Secondary | ICD-10-CM | POA: Diagnosis not present

## 2021-08-05 DIAGNOSIS — C3490 Malignant neoplasm of unspecified part of unspecified bronchus or lung: Secondary | ICD-10-CM | POA: Diagnosis not present

## 2021-08-05 HISTORY — PX: FEMUR IM NAIL: SHX1597

## 2021-08-05 LAB — CBC WITH DIFFERENTIAL/PLATELET
Abs Immature Granulocytes: 0.11 10*3/uL — ABNORMAL HIGH (ref 0.00–0.07)
Basophils Absolute: 0 10*3/uL (ref 0.0–0.1)
Basophils Relative: 0 %
Eosinophils Absolute: 0.1 10*3/uL (ref 0.0–0.5)
Eosinophils Relative: 0 %
HCT: 32.3 % — ABNORMAL LOW (ref 39.0–52.0)
Hemoglobin: 10 g/dL — ABNORMAL LOW (ref 13.0–17.0)
Immature Granulocytes: 1 %
Lymphocytes Relative: 10 %
Lymphs Abs: 1.2 10*3/uL (ref 0.7–4.0)
MCH: 28.8 pg (ref 26.0–34.0)
MCHC: 31 g/dL (ref 30.0–36.0)
MCV: 93.1 fL (ref 80.0–100.0)
Monocytes Absolute: 1.3 10*3/uL — ABNORMAL HIGH (ref 0.1–1.0)
Monocytes Relative: 10 %
Neutro Abs: 9.6 10*3/uL — ABNORMAL HIGH (ref 1.7–7.7)
Neutrophils Relative %: 79 %
Platelets: 163 10*3/uL (ref 150–400)
RBC: 3.47 MIL/uL — ABNORMAL LOW (ref 4.22–5.81)
RDW: 16.2 % — ABNORMAL HIGH (ref 11.5–15.5)
WBC: 12.3 10*3/uL — ABNORMAL HIGH (ref 4.0–10.5)
nRBC: 0.4 % — ABNORMAL HIGH (ref 0.0–0.2)

## 2021-08-05 LAB — POCT I-STAT 7, (LYTES, BLD GAS, ICA,H+H)
Acid-base deficit: 4 mmol/L — ABNORMAL HIGH (ref 0.0–2.0)
Bicarbonate: 21.3 mmol/L (ref 20.0–28.0)
Calcium, Ion: 0.99 mmol/L — ABNORMAL LOW (ref 1.15–1.40)
HCT: 29 % — ABNORMAL LOW (ref 39.0–52.0)
Hemoglobin: 9.9 g/dL — ABNORMAL LOW (ref 13.0–17.0)
O2 Saturation: 100 %
Patient temperature: 37.5
Potassium: 4.4 mmol/L (ref 3.5–5.1)
Sodium: 136 mmol/L (ref 135–145)
TCO2: 22 mmol/L (ref 22–32)
pCO2 arterial: 38.5 mmHg (ref 32.0–48.0)
pH, Arterial: 7.353 (ref 7.350–7.450)
pO2, Arterial: 253 mmHg — ABNORMAL HIGH (ref 83.0–108.0)

## 2021-08-05 LAB — COMPREHENSIVE METABOLIC PANEL
ALT: 12 U/L (ref 0–44)
AST: 14 U/L — ABNORMAL LOW (ref 15–41)
Albumin: 2.1 g/dL — ABNORMAL LOW (ref 3.5–5.0)
Alkaline Phosphatase: 86 U/L (ref 38–126)
Anion gap: 9 (ref 5–15)
BUN: 32 mg/dL — ABNORMAL HIGH (ref 6–20)
CO2: 26 mmol/L (ref 22–32)
Calcium: 8.5 mg/dL — ABNORMAL LOW (ref 8.9–10.3)
Chloride: 96 mmol/L — ABNORMAL LOW (ref 98–111)
Creatinine, Ser: 1.39 mg/dL — ABNORMAL HIGH (ref 0.61–1.24)
GFR, Estimated: 58 mL/min — ABNORMAL LOW (ref >60)
Glucose, Bld: 300 mg/dL — ABNORMAL HIGH (ref 70–99)
Potassium: 4.1 mmol/L (ref 3.5–5.1)
Sodium: 131 mmol/L — ABNORMAL LOW (ref 135–145)
Total Bilirubin: 1 mg/dL (ref 0.3–1.2)
Total Protein: 5.7 g/dL — ABNORMAL LOW (ref 6.5–8.1)

## 2021-08-05 LAB — GLUCOSE, CAPILLARY
Glucose-Capillary: 286 mg/dL — ABNORMAL HIGH (ref 70–99)
Glucose-Capillary: 198 mg/dL — ABNORMAL HIGH (ref 70–99)
Glucose-Capillary: 173 mg/dL — ABNORMAL HIGH (ref 70–99)

## 2021-08-05 LAB — RETICULOCYTES
Immature Retic Fract: 33.3 % — ABNORMAL HIGH (ref 2.3–15.9)
RBC.: 3.45 MIL/uL — ABNORMAL LOW (ref 4.22–5.81)
Retic Count, Absolute: 92.8 10*3/uL (ref 19.0–186.0)
Retic Ct Pct: 2.7 % (ref 0.4–3.1)

## 2021-08-05 LAB — IRON AND TIBC
Iron: 34 ug/dL — ABNORMAL LOW (ref 45–182)
Saturation Ratios: 16 % — ABNORMAL LOW (ref 17.9–39.5)
TIBC: 207 ug/dL — ABNORMAL LOW (ref 250–450)
UIBC: 173 ug/dL

## 2021-08-05 LAB — SURGICAL PCR SCREEN
MRSA, PCR: NEGATIVE
Staphylococcus aureus: NEGATIVE

## 2021-08-05 LAB — MAGNESIUM: Magnesium: 1.9 mg/dL (ref 1.7–2.4)

## 2021-08-05 LAB — HEMOGLOBIN A1C
Hgb A1c MFr Bld: 11.3 % — ABNORMAL HIGH (ref 4.8–5.6)
Mean Plasma Glucose: 277.61 mg/dL

## 2021-08-05 LAB — VITAMIN B12: Vitamin B-12: 933 pg/mL — ABNORMAL HIGH (ref 180–914)

## 2021-08-05 LAB — PHOSPHORUS: Phosphorus: 3.1 mg/dL (ref 2.5–4.6)

## 2021-08-05 LAB — FERRITIN: Ferritin: 509 ng/mL — ABNORMAL HIGH (ref 24–336)

## 2021-08-05 LAB — FOLATE: Folate: 12.3 ng/mL (ref >5.9)

## 2021-08-05 IMAGING — RF DG FEMUR 2+V*L*
1 series · 8 of 8 positions shown · non-contrast
Comparison: None.

CLINICAL DATA: Intramedullary nail fixation.

EXAM:
LEFT FEMUR 2 VIEWS

[Series 1: run · 8 of 8 slices shown]
[im 1/8]
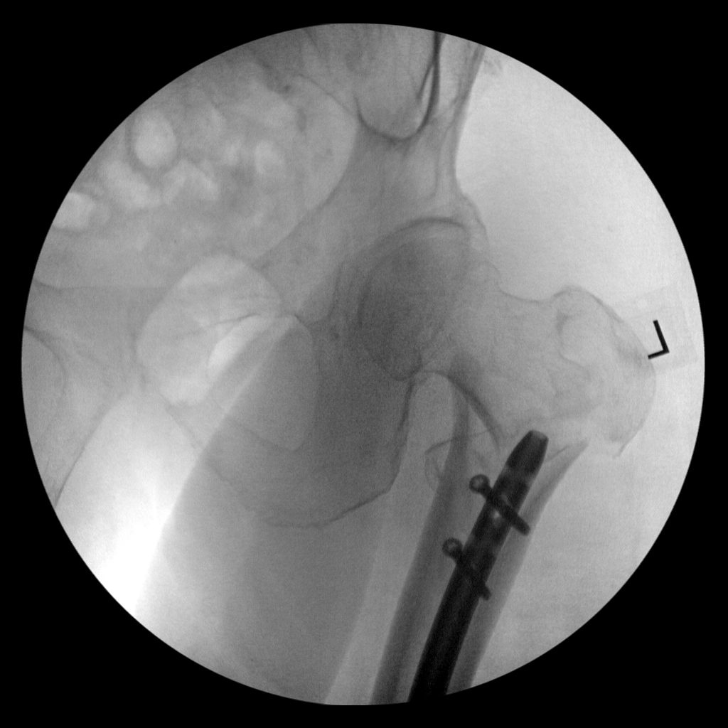
[im 2/8]
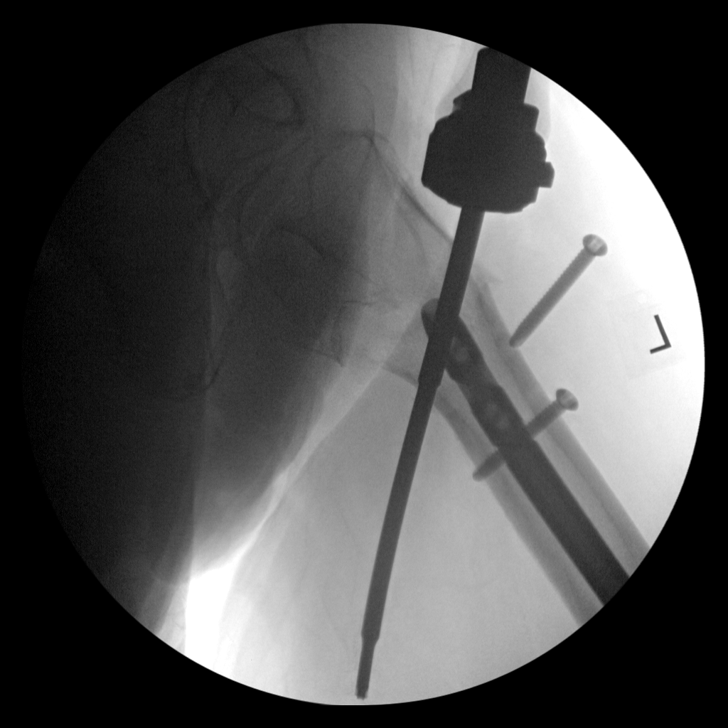
[im 3/8]
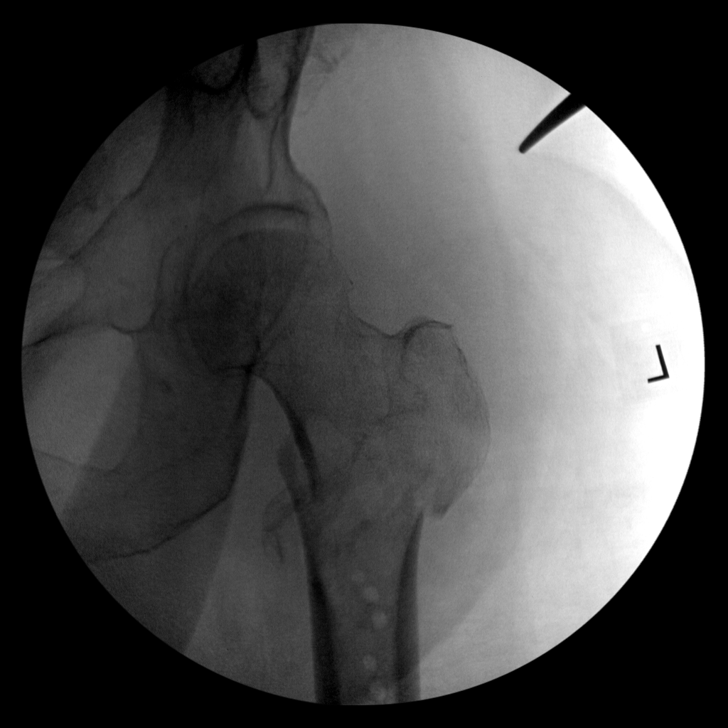
[im 4/8]
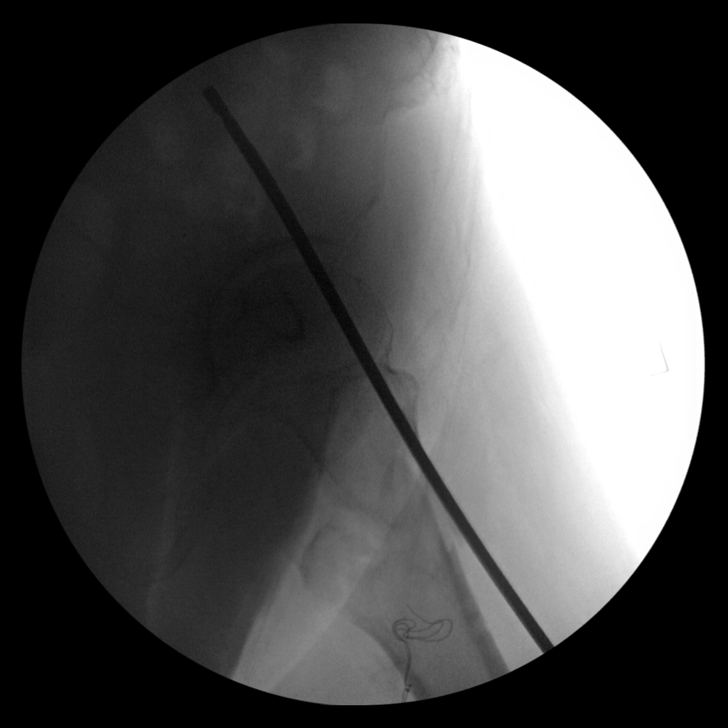
[im 5/8]
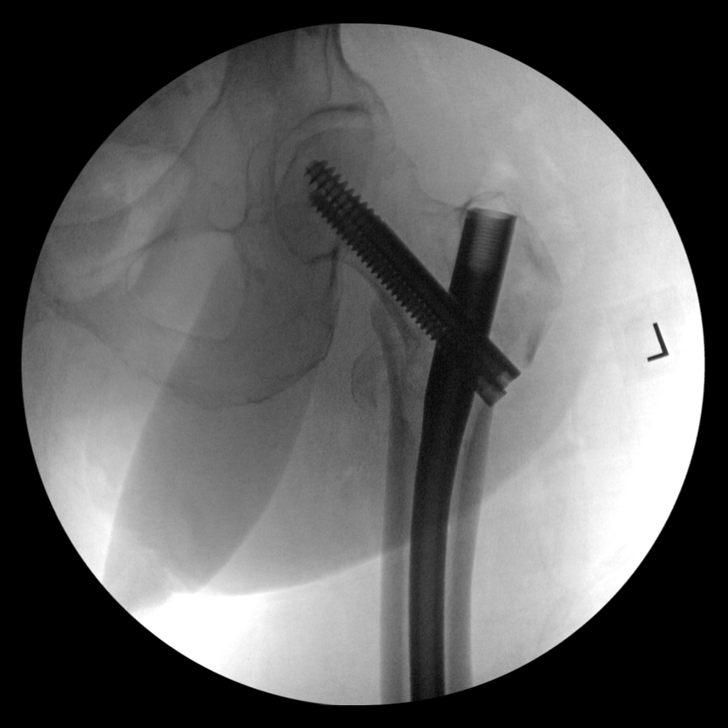
[im 6/8]
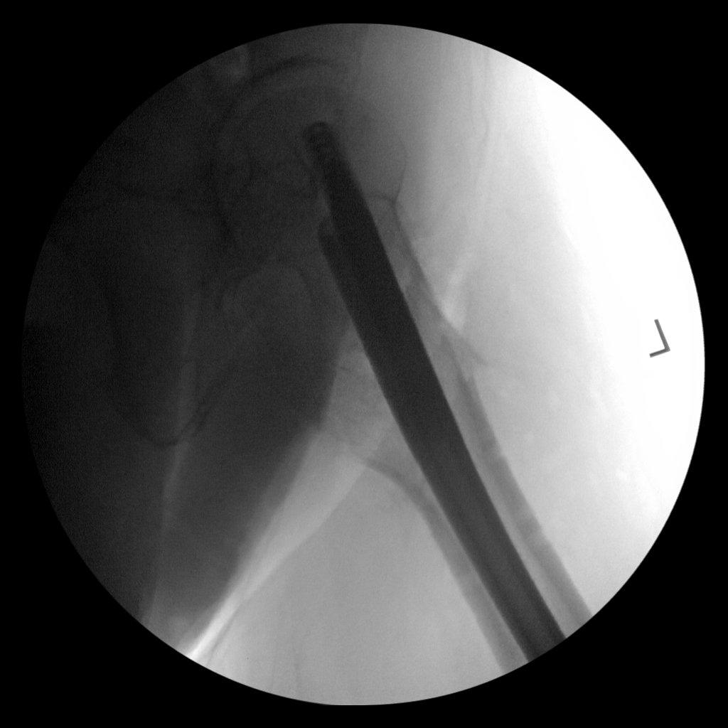
[im 7/8]
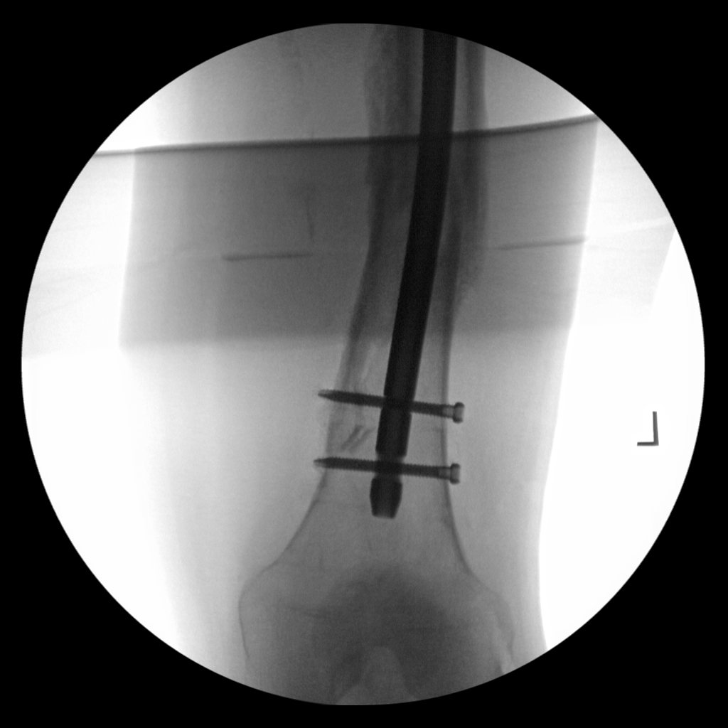
[im 8/8]
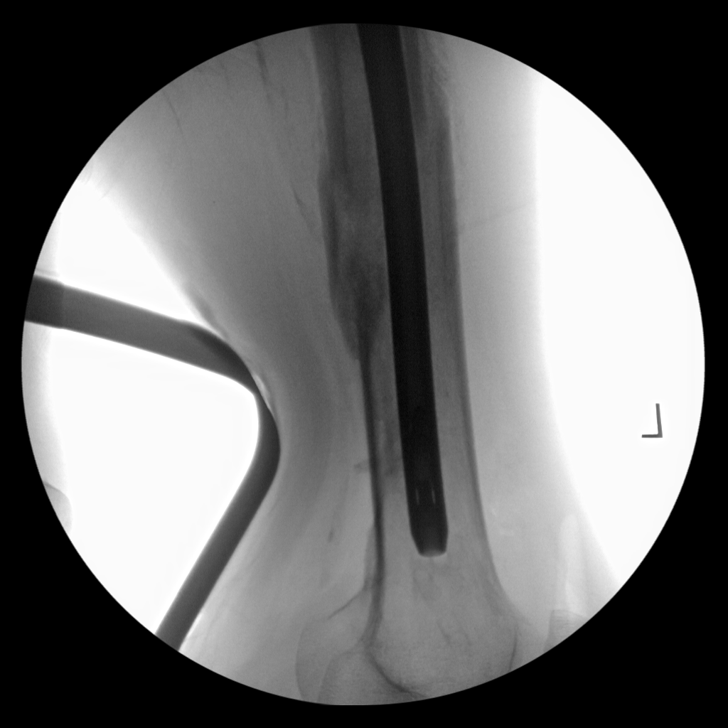

[8 of 8 positions shown; findings below may reference images not displayed]

FINDINGS: Eight intraoperative views of the LEFT femur. Removal of
intramedullary nail from inferior approach. Placement of a
intramedullary nail from a superior approach. Dynamic screws span
the femoral neck. Distal interlocking screws.
IMPRESSION: Intramedullary nail fixation of LEFT intertrochanteric femur
fracture.

## 2021-08-05 SURGERY — INSERTION, INTRAMEDULLARY ROD, FEMUR
Anesthesia: General | Laterality: Left

## 2021-08-05 MED ORDER — ROCURONIUM BROMIDE 10 MG/ML (PF) SYRINGE
PREFILLED_SYRINGE | INTRAVENOUS | Status: AC
  Filled 2021-08-05: qty 10

## 2021-08-05 MED ORDER — ONDANSETRON HCL 4 MG/2ML IJ SOLN
INTRAMUSCULAR | Status: AC
  Filled 2021-08-05: qty 2

## 2021-08-05 MED ORDER — ETOMIDATE 2 MG/ML IV SOLN
INTRAVENOUS | Status: AC
  Filled 2021-08-05: qty 10

## 2021-08-05 MED ORDER — ACETAMINOPHEN 500 MG PO TABS
ORAL_TABLET | ORAL | Status: AC
  Administered 2021-08-05: 1000 mg via ORAL
  Filled 2021-08-05: qty 2

## 2021-08-05 MED ORDER — FENTANYL CITRATE (PF) 250 MCG/5ML IJ SOLN
INTRAMUSCULAR | Status: AC
  Filled 2021-08-05: qty 5

## 2021-08-05 MED ORDER — INSULIN ASPART 100 UNIT/ML IJ SOLN
0.0000 [IU] | INTRAMUSCULAR | Status: DC
  Administered 2021-08-05: 5 [IU] via SUBCUTANEOUS
  Administered 2021-08-05: 2 [IU] via SUBCUTANEOUS

## 2021-08-05 MED ORDER — ONDANSETRON HCL 4 MG/2ML IJ SOLN
INTRAMUSCULAR | Status: DC | PRN
  Administered 2021-08-05: 4 mg via INTRAVENOUS

## 2021-08-05 MED ORDER — ALBUMIN HUMAN 5 % IV SOLN: INTRAVENOUS | Status: DC | PRN

## 2021-08-05 MED ORDER — VANCOMYCIN HCL 1000 MG IV SOLR
INTRAVENOUS | Status: AC
  Filled 2021-08-05: qty 20

## 2021-08-05 MED ORDER — EPINEPHRINE 1 MG/10ML IJ SOSY
PREFILLED_SYRINGE | INTRAMUSCULAR | Status: DC | PRN
  Administered 2021-08-05 (×3): 1 mg via INTRAVENOUS

## 2021-08-05 MED ORDER — FENTANYL CITRATE (PF) 250 MCG/5ML IJ SOLN
INTRAMUSCULAR | Status: DC | PRN
  Administered 2021-08-05: 25 ug via INTRAVENOUS
  Administered 2021-08-05: 100 ug via INTRAVENOUS

## 2021-08-05 MED ORDER — CEFAZOLIN SODIUM-DEXTROSE 2-4 GM/100ML-% IV SOLN
2.0000 g | Freq: Once | INTRAVENOUS | Status: AC
  Administered 2021-08-05: 2 g via INTRAVENOUS

## 2021-08-05 MED ORDER — 0.9 % SODIUM CHLORIDE (POUR BTL) OPTIME
TOPICAL | Status: DC | PRN
  Administered 2021-08-05: 1000 mL

## 2021-08-05 MED ORDER — PROPOFOL 10 MG/ML IV BOLUS
INTRAVENOUS | Status: AC
  Filled 2021-08-05: qty 20

## 2021-08-05 MED ORDER — SODIUM CHLORIDE 0.9 % IV SOLN
INTRAVENOUS | Status: DC | PRN
  Administered 2021-08-05: 10 ug/min via INTRAVENOUS

## 2021-08-05 MED ORDER — CHLORHEXIDINE GLUCONATE 0.12 % MT SOLN
OROMUCOSAL | Status: AC
  Administered 2021-08-05: 15 mL via OROMUCOSAL
  Filled 2021-08-05: qty 15

## 2021-08-05 MED ORDER — PHENYLEPHRINE 40 MCG/ML (10ML) SYRINGE FOR IV PUSH (FOR BLOOD PRESSURE SUPPORT)
PREFILLED_SYRINGE | INTRAVENOUS | Status: AC
  Filled 2021-08-05: qty 10

## 2021-08-05 MED ORDER — DEXAMETHASONE SODIUM PHOSPHATE 10 MG/ML IJ SOLN
INTRAMUSCULAR | Status: DC | PRN
  Administered 2021-08-05: 5 mg via INTRAVENOUS

## 2021-08-05 MED ORDER — MIDAZOLAM HCL 2 MG/2ML IJ SOLN
INTRAMUSCULAR | Status: AC
  Filled 2021-08-05: qty 2

## 2021-08-05 MED ORDER — MIDAZOLAM HCL 2 MG/2ML IJ SOLN
INTRAMUSCULAR | Status: DC | PRN
  Administered 2021-08-05: 1 mg via INTRAVENOUS

## 2021-08-05 MED ORDER — PHENYLEPHRINE HCL-NACL 20-0.9 MG/250ML-% IV SOLN
INTRAVENOUS | Status: DC | PRN
  Administered 2021-08-05: 50 ug/min via INTRAVENOUS

## 2021-08-05 MED ORDER — ORAL CARE MOUTH RINSE: 15.0000 mL | Freq: Once | OROMUCOSAL | Status: AC

## 2021-08-05 MED ORDER — SODIUM CHLORIDE 0.9 % IV SOLN: INTRAVENOUS | Status: DC | PRN

## 2021-08-05 MED ORDER — ETOMIDATE 2 MG/ML IV SOLN
INTRAVENOUS | Status: DC | PRN
  Administered 2021-08-05: 20 mg via INTRAVENOUS

## 2021-08-05 MED ORDER — SUGAMMADEX SODIUM 200 MG/2ML IV SOLN
INTRAVENOUS | Status: DC | PRN
Start: 2021-08-05 — End: 2021-08-05
  Administered 2021-08-05: 200 mg via INTRAVENOUS

## 2021-08-05 MED ORDER — LIDOCAINE 2% (20 MG/ML) 5 ML SYRINGE
INTRAMUSCULAR | Status: AC
  Filled 2021-08-05: qty 5

## 2021-08-05 MED ORDER — VASOPRESSIN 20 UNIT/ML IV SOLN
INTRAVENOUS | Status: DC | PRN
  Administered 2021-08-05 (×5): 4 [IU] via INTRAVENOUS

## 2021-08-05 MED ORDER — SUCCINYLCHOLINE CHLORIDE 200 MG/10ML IV SOSY
PREFILLED_SYRINGE | INTRAVENOUS | Status: AC
  Filled 2021-08-05: qty 10

## 2021-08-05 MED ORDER — CHLORHEXIDINE GLUCONATE 0.12 % MT SOLN: 15.0000 mL | Freq: Once | OROMUCOSAL | Status: AC

## 2021-08-05 MED ORDER — CEFAZOLIN SODIUM-DEXTROSE 2-4 GM/100ML-% IV SOLN
INTRAVENOUS | Status: AC
  Filled 2021-08-05: qty 100

## 2021-08-05 MED ORDER — ACETAMINOPHEN 500 MG PO TABS: 1000.0000 mg | ORAL_TABLET | Freq: Once | ORAL | Status: AC

## 2021-08-05 MED ORDER — VANCOMYCIN HCL 1000 MG IV SOLR
INTRAVENOUS | Status: DC | PRN
  Administered 2021-08-05: 1000 mg via TOPICAL

## 2021-08-05 MED ORDER — EPINEPHRINE HCL 5 MG/250ML IV SOLN IN NS
0.5000 ug/min | INTRAVENOUS | Status: DC
Start: 2021-08-05 — End: 2021-08-06
  Filled 2021-08-05: qty 250

## 2021-08-05 MED ORDER — VASOPRESSIN 20 UNIT/ML IV SOLN
INTRAVENOUS | Status: AC
  Filled 2021-08-05: qty 1

## 2021-08-05 MED ORDER — ROCURONIUM BROMIDE 10 MG/ML (PF) SYRINGE
PREFILLED_SYRINGE | INTRAVENOUS | Status: DC | PRN
Start: 2021-08-05 — End: 2021-08-05
  Administered 2021-08-05: 50 mg via INTRAVENOUS
  Administered 2021-08-05: 15 mg via INTRAVENOUS

## 2021-08-05 MED ORDER — PHENYLEPHRINE 40 MCG/ML (10ML) SYRINGE FOR IV PUSH (FOR BLOOD PRESSURE SUPPORT)
PREFILLED_SYRINGE | INTRAVENOUS | Status: DC | PRN
  Administered 2021-08-05 (×4): 80 ug via INTRAVENOUS
  Administered 2021-08-05: 160 ug via INTRAVENOUS
  Administered 2021-08-05: 120 ug via INTRAVENOUS

## 2021-08-05 MED ORDER — LIDOCAINE 2% (20 MG/ML) 5 ML SYRINGE
INTRAMUSCULAR | Status: DC | PRN
  Administered 2021-08-05: 100 mg via INTRAVENOUS

## 2021-08-05 MED ORDER — LACTATED RINGERS IV SOLN: INTRAVENOUS | Status: DC

## 2021-08-05 SURGICAL SUPPLY — 65 items
ADH SKN CLS LQ APL DERMABOND (GAUZE/BANDAGES/DRESSINGS) ×2
BAG COUNTER SPONGE SURGICOUNT (BAG) ×2 IMPLANT
BIT DRILL INTERTAN LAG SCREW (BIT) ×1 IMPLANT
BIT DRILL SHORT 4.0 (BIT) IMPLANT
BLADE SURG 10 STRL SS (BLADE) ×4 IMPLANT
BNDG COHESIVE 4X5 TAN STRL (GAUZE/BANDAGES/DRESSINGS) ×1 IMPLANT
BNDG COHESIVE 6X5 TAN STRL LF (GAUZE/BANDAGES/DRESSINGS) ×1 IMPLANT
BNDG ELASTIC 4X5.8 VLCR STR LF (GAUZE/BANDAGES/DRESSINGS) ×2 IMPLANT
BNDG ELASTIC 6X5.8 VLCR STR LF (GAUZE/BANDAGES/DRESSINGS) ×2 IMPLANT
BRUSH SCRUB EZ PLAIN DRY (MISCELLANEOUS) ×4 IMPLANT
CHLORAPREP W/TINT 26 (MISCELLANEOUS) ×2 IMPLANT
COVER SURGICAL LIGHT HANDLE (MISCELLANEOUS) ×2 IMPLANT
DERMABOND ADHESIVE PROPEN (GAUZE/BANDAGES/DRESSINGS) ×2
DERMABOND ADVANCED .7 DNX6 (GAUZE/BANDAGES/DRESSINGS) IMPLANT
DRAPE C-ARM 35X43 STRL (DRAPES) ×2 IMPLANT
DRAPE C-ARM 42X72 X-RAY (DRAPES) ×1 IMPLANT
DRAPE C-ARMOR (DRAPES) ×2 IMPLANT
DRAPE HALF SHEET 40X57 (DRAPES) ×4 IMPLANT
DRAPE IMP U-DRAPE 54X76 (DRAPES) ×4 IMPLANT
DRAPE INCISE IOBAN 66X45 STRL (DRAPES) ×2 IMPLANT
DRAPE ORTHO SPLIT 77X108 STRL (DRAPES) ×4
DRAPE SURG 17X23 STRL (DRAPES) ×2 IMPLANT
DRAPE SURG ORHT 6 SPLT 77X108 (DRAPES) ×2 IMPLANT
DRAPE U-SHAPE 47X51 STRL (DRAPES) ×2 IMPLANT
DRESSING MEPILEX FLEX 4X4 (GAUZE/BANDAGES/DRESSINGS) IMPLANT
DRILL BIT SHORT 4.0 (BIT) ×4
DRSG MEPILEX BORDER 4X4 (GAUZE/BANDAGES/DRESSINGS) ×6 IMPLANT
DRSG MEPILEX BORDER 4X8 (GAUZE/BANDAGES/DRESSINGS) ×2 IMPLANT
DRSG MEPILEX FLEX 4X4 (GAUZE/BANDAGES/DRESSINGS) ×8
ELECT REM PT RETURN 9FT ADLT (ELECTROSURGICAL) ×2
ELECTRODE REM PT RTRN 9FT ADLT (ELECTROSURGICAL) ×1 IMPLANT
GLOVE SURG ENC MOIS LTX SZ6.5 (GLOVE) ×6 IMPLANT
GLOVE SURG ENC MOIS LTX SZ7.5 (GLOVE) ×6 IMPLANT
GLOVE SURG UNDER POLY LF SZ6.5 (GLOVE) ×2 IMPLANT
GLOVE SURG UNDER POLY LF SZ7.5 (GLOVE) ×2 IMPLANT
GOWN STRL REUS W/ TWL LRG LVL3 (GOWN DISPOSABLE) ×3 IMPLANT
GOWN STRL REUS W/ TWL XL LVL3 (GOWN DISPOSABLE) ×1 IMPLANT
GOWN STRL REUS W/TWL LRG LVL3 (GOWN DISPOSABLE) ×6
GOWN STRL REUS W/TWL XL LVL3 (GOWN DISPOSABLE) ×2
GUIDE PIN 3.2X343 (PIN) ×2
GUIDE PIN 3.2X343MM (PIN) ×4
GUIDE ROD 3.0 (MISCELLANEOUS) ×2
KIT BASIN OR (CUSTOM PROCEDURE TRAY) ×2 IMPLANT
KIT TURNOVER KIT B (KITS) ×2 IMPLANT
MANIFOLD NEPTUNE II (INSTRUMENTS) ×2 IMPLANT
NAIL IM TRI 11.5X400 130D LT (Nail) ×1 IMPLANT
NS IRRIG 1000ML POUR BTL (IV SOLUTION) ×2 IMPLANT
PACK GENERAL/GYN (CUSTOM PROCEDURE TRAY) ×2 IMPLANT
PAD ARMBOARD 7.5X6 YLW CONV (MISCELLANEOUS) ×4 IMPLANT
PIN GUIDE 3.2X343MM (PIN) IMPLANT
ROD GUIDE 3.0 (MISCELLANEOUS) IMPLANT
SCREW LAG COMPR KIT 100/95 (Screw) ×1 IMPLANT
SCREW TRIGEN LOW PROF 5.0X47.5 (Screw) ×2 IMPLANT
STAPLER VISISTAT 35W (STAPLE) ×2 IMPLANT
STOCKINETTE IMPERVIOUS LG (DRAPES) ×2 IMPLANT
SUT ETHILON 3 0 PS 1 (SUTURE) ×2 IMPLANT
SUT MNCRL AB 3-0 PS2 18 (SUTURE) ×3 IMPLANT
SUT VIC AB 0 CT1 27 (SUTURE) ×4
SUT VIC AB 0 CT1 27XBRD ANBCTR (SUTURE) IMPLANT
SUT VIC AB 2-0 CT1 27 (SUTURE)
SUT VIC AB 2-0 CT1 TAPERPNT 27 (SUTURE) ×2 IMPLANT
TOWEL GREEN STERILE (TOWEL DISPOSABLE) ×4 IMPLANT
TOWEL GREEN STERILE FF (TOWEL DISPOSABLE) ×2 IMPLANT
UNDERPAD 30X36 HEAVY ABSORB (UNDERPADS AND DIAPERS) ×2 IMPLANT
WATER STERILE IRR 1000ML POUR (IV SOLUTION) ×2 IMPLANT

## 2021-08-06 NOTE — Addendum Note (Signed)
Addendum  created 08/06/21 1107 by Brennan Bailey, MD   Clinical Note Signed

## 2021-08-08 ENCOUNTER — Encounter (HOSPITAL_COMMUNITY): Payer: Self-pay | Admitting: Student

## 2021-08-08 ENCOUNTER — Telehealth: Payer: Self-pay | Admitting: Medical Oncology

## 2021-08-08 NOTE — Telephone Encounter (Signed)
Dr Tamala Julian is requesting a call back from Dr Julien Nordmann to talk about his father .   Zephyr suffered a femur fracture and died in surgery.

## 2021-08-13 NOTE — Anesthesia Postprocedure Evaluation (Signed)
Anesthesia Post Note  Patient: Dennis [name].  Procedure(s) Performed: INTRAMEDULLARY (IM) NAIL FEMORAL (Left)     Pt Deceased, brother present              Sundi Slevin,E. Eileen Kangas

## 2021-08-13 NOTE — Anesthesia Procedure Notes (Signed)
Arterial Line Insertion Start/End11-08-2021 3:00 PM, 2021/08/23 3:10 PM Performed by: Jearld Pies, CRNA, CRNA  Patient location: Pre-op. Preanesthetic checklist: patient identified, IV checked, site marked, risks and benefits discussed, surgical consent, monitors and equipment checked, pre-op evaluation and timeout performed Lidocaine 1% used for infiltration Left, radial was placed Catheter size: 20 G Hand hygiene performed , maximum sterile barriers used  and Seldinger technique used Allen's test indicative of satisfactory collateral circulation Attempts: 1 Procedure performed without using ultrasound guided technique. Following insertion, dressing applied and Biopatch. Patient tolerated the procedure well with no immediate complications.

## 2021-08-13 NOTE — Anesthesia Procedure Notes (Signed)
Procedure Name: Intubation Date/Time: August 08, 2021 4:17 PM Performed by: Inda Coke, CRNA Pre-anesthesia Checklist: Patient identified, Emergency Drugs available, Suction available and Patient being monitored Patient Re-evaluated:Patient Re-evaluated prior to induction Oxygen Delivery Method: Circle System Utilized Preoxygenation: Pre-oxygenation with 100% oxygen Induction Type: IV induction Ventilation: Mask ventilation without difficulty Laryngoscope Size: Mac and 4 Grade View: Grade I Tube type: Oral Tube size: 7.5 mm Number of attempts: 1 Airway Equipment and Method: Stylet and Oral airway Placement Confirmation: ETT inserted through vocal cords under direct vision, positive ETCO2 and breath sounds checked- equal and bilateral Secured at: 23 cm Tube secured with: Tape Dental Injury: Teeth and Oropharynx as per pre-operative assessment

## 2021-08-13 NOTE — Anesthesia Preprocedure Evaluation (Addendum)
Anesthesia Evaluation  Patient identified by MRN, date of birth, ID band Patient awake    Reviewed: Allergy & Precautions, NPO status , Patient's Chart, lab work & pertinent test results  History of Anesthesia Complications Negative for: history of anesthetic complications  Airway Mallampati: II  TM Distance: >3 FB Neck ROM: Full    Dental  (+) Missing,    Pulmonary sleep apnea , former smoker,    Pulmonary exam normal        Cardiovascular hypertension, + CAD and +CHF  Normal cardiovascular exam+ Cardiac Defibrillator   TTE 02/2021: EF <20%, global hypokinesis, mild LVE, mild LVH, RV systolic function moderately reduced, mild RVE, mild LAE, 2.4 x 1.4 cm vegetation that appears to be attached to the tricuspid valve as well as vegetation  attached to the ICD lead as it transits the tricuspid valve (?endocarditis), mild dilatation of aortic root measuring 84mm    Neuro/Psych Depression CVA    GI/Hepatic negative GI ROS, (+)     substance abuse  cocaine use,   Endo/Other  diabetes, Type 2, Insulin Dependent  Renal/GU Renal InsufficiencyRenal disease (Cr 1.39)  negative genitourinary   Musculoskeletal negative musculoskeletal ROS (+)   Abdominal   Peds  Hematology negative hematology ROS (+)   Anesthesia Other Findings Day of surgery medications reviewed with patient.  Reproductive/Obstetrics negative OB ROS                            Anesthesia Physical Anesthesia Plan  ASA: 5  Anesthesia Plan: General   Post-op Pain Management:    Induction: Intravenous  PONV Risk Score and Plan: 2 and Treatment may vary due to age or medical condition, Midazolam, Ondansetron and Dexamethasone  Airway Management Planned: Oral ETT  Additional Equipment: Arterial line  Intra-op Plan:   Post-operative Plan: Extubation in OR  Informed Consent: I have reviewed the patients History and Physical,  chart, labs and discussed the procedure including the risks, benefits and alternatives for the proposed anesthesia with the patient or authorized representative who has indicated his/her understanding and acceptance.   Patient has DNR.  Discussed DNR with patient and Continue DNR.   Dental advisory given  Plan Discussed with: CRNA  Anesthesia Plan Comments: (DNR status discussed with patient in preop. Patient would like to remain DNR periprocedurally. Daiva Huge, MD)      Anesthesia Quick Evaluation

## 2021-08-13 NOTE — H&P (View-Only) (Signed)
Orthopaedic Trauma Service (OTS) Consult   Patient ID: Dennis [name]. MRN: [id number] DOB/AGE: 01-18-1961 60 y.o.  Reason for Consult:Left periprosthetic intertrochanteric femur fracture Referring Physician: Dr. Raiford Noble, MD [facility]  HPI: Dennis [name]. is an 60 y.o. male being seen in consultation at the request of Dr. Alfredia Ferguson for evaluation of left periprosthetic intertrochanteric femur fracture.  Patient is known to me from her previous left femur fracture that was treated with a retrograde IM nailing.  He had done decent regarding this and was ambulating with the use of a walker.  He does have metastatic lung cancer for which she is on hospice.  He was walking around and fell landed on his hip and developed immediate pain and deformity.  X-ray showed a periprosthetic intertrochanteric femur fracture.  I was not on-call but was contacted by the on-call provider Dr. Ronnie Derby.  Patient was seen and evaluated on 5 N.  Currently comfortable.  Not having much pain but has significant pain when he tries to move.  Denies any numbness or tingling.  States that he would like to get it fixed as he was walking prior to his injury.  Denies any significant numbness or tingling in his lower extremity.  Past Medical History:  Diagnosis Date   Alcohol abuse with alcohol-induced mood disorder ([place]) 08/18/2015   Automatic implantable cardioverter-defibrillator in situ 2012   S/P St Jude Dual Chamber. ICD.   CAD (coronary artery disease) 11/16/2014   CAD Coronary angiography (12/2008) with mid LAD totally occluded and collateralized.   Cardiac LV ejection fraction 10-20%    Chest pain 09/04/2018   CHF (congestive heart failure) (HCC)    Chronic systolic heart failure ([place])    a) Mixed ICM/NICM b) RHC (05/2014): RA 2, RV 19/2/3, PA 22/14 (18), PCWP 6, Fick CO/CI: 5.2 / 2.7, PVR 2.3 WU, PA 60% and 64% c) ECHO (05/2014): EF 20-25%, diff HK, akinesis entireanteroseptal myocardium, triv AI, mod MR, LA  mod/sev dilated   Cocaine abuse ([place]) 01/24/2015   Cocaine abuse with cocaine-induced mood disorder ([place]) 08/20/2015   Drug abuse and dependence ([place])    Dysphagia following cerebral infarction 07/03/1940   Embolic stroke involving right middle cerebral artery (HCC)    Extensive stage primary small cell carcinoma of lung ([place]) 01/04/2020   H/O noncompliance with medical treatment, presenting hazards to health 01/24/2015   Heart murmur    High cholesterol    Ischemic cardiomyopathy    a) Coronary angiography (12/2008) at [facility]: [place]: nl, LAD mid 100% stenosis with left to left and right-to-left collaterals to the distal LAD; Lcx: nl, RCA nl.     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[place])    MDD (major depressive disorder), recurrent severe, without psychosis ([place]) 08/18/2015   Open wound of foot 02/27/2015   Pneumonia 1988   Psychoactive substance-induced mood disorder ([place]) 07/17/2015   Sleep apnea    "cleared after T&A"   Status post PICC central line placement    Stroke [facility])    Substance induced mood disorder ([place]) 08/17/2015   Type II diabetes mellitus ([place])     Past Surgical History:  Procedure Laterality Date   BRONCHIAL BRUSHINGS  12/28/2019   Procedure: BRONCHIAL BRUSHINGS;  Surgeon: Garner Nash, DO;  Location: [place];  Service: Thoracic;;   BRONCHIAL NEEDLE ASPIRATION BIOPSY  12/28/2019   Procedure: BRONCHIAL NEEDLE ASPIRATION BIOPSIES;  Surgeon: Garner Nash, DO;  Location: [place];  Service: Thoracic;;   BRONCHIAL WASHINGS  12/28/2019  Procedure: BRONCHIAL WASHINGS;  Surgeon: Garner Nash, DO;  Location: [place];  Service: Thoracic;;   CARDIAC CATHETERIZATION  05/2014   CARDIAC DEFIBRILLATOR PLACEMENT  03/2011   CHOLECYSTECTOMY  1994   FEMUR IM NAIL Left 10/31/2020   Procedure: INTRAMEDULLARY (IM) RETROGRADE FEMORAL NAILING;  Surgeon: Shona Needles, MD;  Location: [place];  Service: Orthopedics;  Laterality: Left;   FOREARM FRACTURE SURGERY Left 1980    FRACTURE SURGERY     IR IMAGING GUIDED PORT INSERTION  10/11/2020   RIGHT HEART CATHETERIZATION N/A 05/30/2014   Procedure: RIGHT HEART CATH;  Surgeon: Jolaine Artist, MD;  Location: [facility] CATH LAB;  Service: Cardiovascular;  Laterality: N/A;   RIGHT HEART CATHETERIZATION N/A 02/07/2015   Procedure: RIGHT HEART CATH;  Surgeon: Jolaine Artist, MD;  Location: [facility] CATH LAB;  Service: Cardiovascular;  Laterality: N/A;   TONSILLECTOMY AND ADENOIDECTOMY  ~ 1998   VIDEO BRONCHOSCOPY WITH ENDOBRONCHIAL ULTRASOUND N/A 12/28/2019   Procedure: VIDEO BRONCHOSCOPY WITH ENDOBRONCHIAL ULTRASOUND;  Surgeon: Garner Nash, DO;  Location: [place];  Service: Thoracic;  Laterality: N/A;    Family History  Problem Relation Age of Onset   Diabetes Mother    Heart disease Mother    Diabetes Father    Prostate cancer Father    Heart disease Father    Diabetes Brother    Diabetes Brother     Social History:  reports that he has quit smoking. His smoking use included cigarettes. He has a 7.75 pack-year smoking history. He has never used smokeless tobacco. He reports that he does not currently use alcohol. He reports current drug use. Drug: Cocaine.  Allergies: No Known Allergies  Medications:  No current facility-administered medications on file prior to encounter.   Current Outpatient Medications on File Prior to Encounter  Medication Sig Dispense Refill   acetaminophen (TYLENOL) 325 MG tablet Take 2 tablets (650 mg total) by mouth every 6 (six) hours as needed for mild pain (or Fever >/= 101).     atorvastatin (LIPITOR) 20 MG tablet TAKE 1 TABLET BY MOUTH DAILY AT 6 PM. (Patient taking differently: Take 20 mg by mouth every evening.) 30 tablet 6   Blood Glucose Monitoring Suppl (FIFTY50 GLUCOSE METER 2.0) w/Device KIT Check blood sugar 3 times a week     budesonide-formoterol (SYMBICORT) 160-4.5 MCG/ACT inhaler Inhale 2 puffs into the lungs 2 (two) times daily. 1 each 12   cholecalciferol  (VITAMIN D) 25 MCG (1000 UNIT) tablet Take 2,000 Units by mouth in the morning and at bedtime.     dexamethasone (DECADRON) 1 MG tablet Take 1 tablet (1 mg total) by mouth daily. 60 tablet 2   furosemide (LASIX) 20 MG tablet Take 20 mg by mouth daily.     HYDROcodone-acetaminophen (NORCO/VICODIN) 5-325 MG tablet Take 1-2 tablets by mouth every 4 (four) hours as needed for moderate pain. 30 tablet 0   insulin glargine (LANTUS SOLOSTAR) 100 UNIT/ML Solostar Pen Inject 25 Units into the skin daily. (Patient taking differently: Inject 25 Units into the skin 2 (two) times daily.) 15 mL 2   insulin lispro (HUMALOG KWIKPEN) 100 UNIT/ML KwikPen Inject 6 Units into the skin 3 (three) times daily. (Patient taking differently: Inject 6 Units into the skin 2 (two) times daily.) 15 mL 1   INSUPEN ULTRAFIN 31G X 8 MM MISC      methocarbamol (ROBAXIN) 500 MG tablet Take 1 tablet (500 mg total) by mouth every 6 (six) hours as needed for  muscle spasms.     midodrine (PROAMATINE) 10 MG tablet Take 1 tablet (10 mg total) by mouth 3 (three) times daily with meals. (Patient taking differently: Take 10 mg by mouth 3 (three) times daily.) 90 tablet 2   Multiple Vitamin (MULTIVITAMIN) tablet Take 1 tablet by mouth daily.     potassium chloride SA (KLOR-CON) 20 MEQ tablet Take 20 mEq by mouth daily.     prochlorperazine (COMPAZINE) 10 MG tablet Take 10 mg by mouth every 6 (six) hours as needed for nausea or vomiting.     senna (SENOKOT) 8.6 MG TABS tablet Take 1 tablet (8.6 mg total) by mouth 2 (two) times daily. (Patient taking differently: Take 1 tablet by mouth daily as needed for mild constipation.) 120 tablet 0   tamsulosin (FLOMAX) 0.4 MG CAPS capsule Take 0.4 mg by mouth daily.     tiotropium (SPIRIVA HANDIHALER) 18 MCG inhalation capsule Place 1 capsule (18 mcg total) into inhaler and inhale daily. 90 capsule 3   enoxaparin (LOVENOX) 80 MG/0.8ML injection Inject 0.75 mLs (75 mg total) into the skin every 12 (twelve)  hours.**Pt must waste 0.5 mLs before injecting.** (Patient not taking: No sig reported) 48 mL 0   feeding supplement (ENSURE ENLIVE / ENSURE PLUS) LIQD Take 237 mLs by mouth 2 (two) times daily between meals. (Patient not taking: No sig reported) 237 mL 0     ROS: Constitutional: No fever or chills Vision: No changes in vision ENT: No difficulty swallowing CV: No chest pain Pulm: No SOB or wheezing GI: No nausea or vomiting GU: No urgency or inability to hold urine Skin: No poor wound healing Neurologic: No numbness or tingling Psychiatric: No depression or anxiety Heme: No bruising Allergic: No reaction to medications or food   Exam: Blood pressure 109/88, pulse (!) 102, temperature 98 F (36.7 C), resp. rate 18, SpO2 97 %. General: No acute distress Orientation: Awake alert and oriented x3 Mood and Affect: Cooperative and pleasant Gait: Unable to assess due to his fracture Coordination and balance: Within normal limits  Left lower extremity: Surgical incisions from previous have healed.  Leg is shortened and externally rotated.  Active dorsiflexion plantarflexion of his toes and ankle.  He has sensation intact to light touch to the dorsum and plantar aspect of his foot.  He has a warm well-perfused foot.  He does have some pitting edema in his lower extremity.  Right lower extremity: Skin without lesions. No tenderness to palpation. Full painless ROM, full strength in each muscle groups without evidence of instability.   Medical Decision Making: Data: Imaging: X-rays show a comminuted intertrochanteric femur fracture at the proximal portion of the retrograde intramedullary nail.  Labs:  Results for orders placed or performed during the hospital encounter of 08/01/2021 (from the past 24 hour(s))  Glucose, capillary     Status: Abnormal   Collection Time: 08/04/21 12:43 PM  Result Value Ref Range   Glucose-Capillary 356 (H) 70 - 99 mg/dL  Glucose, capillary     Status:  Abnormal   Collection Time: 08/04/21  4:20 PM  Result Value Ref Range   Glucose-Capillary 374 (H) 70 - 99 mg/dL  Glucose, capillary     Status: Abnormal   Collection Time: 08/04/21  9:58 PM  Result Value Ref Range   Glucose-Capillary 473 (H) 70 - 99 mg/dL  Glucose, capillary     Status: Abnormal   Collection Time: 08/04/21 11:57 PM  Result Value Ref Range   Glucose-Capillary 455 (H)  70 - 99 mg/dL  CBC with Differential/Platelet     Status: Abnormal   Collection Time: 2021-08-20  3:40 AM  Result Value Ref Range   WBC 12.3 (H) 4.0 - 10.5 K/uL   RBC 3.47 (L) 4.22 - 5.81 MIL/uL   Hemoglobin 10.0 (L) 13.0 - 17.0 g/dL   HCT 32.3 (L) 39.0 - 52.0 %   MCV 93.1 80.0 - 100.0 fL   MCH 28.8 26.0 - 34.0 pg   MCHC 31.0 30.0 - 36.0 g/dL   RDW 16.2 (H) 11.5 - 15.5 %   Platelets 163 150 - 400 K/uL   nRBC 0.4 (H) 0.0 - 0.2 %   Neutrophils Relative % 79 %   Neutro Abs 9.6 (H) 1.7 - 7.7 K/uL   Lymphocytes Relative 10 %   Lymphs Abs 1.2 0.7 - 4.0 K/uL   Monocytes Relative 10 %   Monocytes Absolute 1.3 (H) 0.1 - 1.0 K/uL   Eosinophils Relative 0 %   Eosinophils Absolute 0.1 0.0 - 0.5 K/uL   Basophils Relative 0 %   Basophils Absolute 0.0 0.0 - 0.1 K/uL   Immature Granulocytes 1 %   Abs Immature Granulocytes 0.11 (H) 0.00 - 0.07 K/uL  Comprehensive metabolic panel     Status: Abnormal   Collection Time: 2021-08-20  3:40 AM  Result Value Ref Range   Sodium 131 (L) 135 - 145 mmol/L   Potassium 4.1 3.5 - 5.1 mmol/L   Chloride 96 (L) 98 - 111 mmol/L   CO2 26 22 - 32 mmol/L   Glucose, Bld 300 (H) 70 - 99 mg/dL   BUN 32 (H) 6 - 20 mg/dL   Creatinine, Ser 1.39 (H) 0.61 - 1.24 mg/dL   Calcium 8.5 (L) 8.9 - 10.3 mg/dL   Total Protein 5.7 (L) 6.5 - 8.1 g/dL   Albumin 2.1 (L) 3.5 - 5.0 g/dL   AST 14 (L) 15 - 41 U/L   ALT 12 0 - 44 U/L   Alkaline Phosphatase 86 38 - 126 U/L   Total Bilirubin 1.0 0.3 - 1.2 mg/dL   GFR, Estimated 58 (L) >60 mL/min   Anion gap 9 5 - 15  Magnesium     Status: None    Collection Time: Aug 20, 2021  3:40 AM  Result Value Ref Range   Magnesium 1.9 1.7 - 2.4 mg/dL  Phosphorus     Status: None   Collection Time: 2021-08-20  3:40 AM  Result Value Ref Range   Phosphorus 3.1 2.5 - 4.6 mg/dL  Vitamin B12     Status: Abnormal   Collection Time: 08/20/2021  3:40 AM  Result Value Ref Range   Vitamin B-12 933 (H) 180 - 914 pg/mL  Iron and TIBC     Status: Abnormal   Collection Time: 08/20/2021  3:40 AM  Result Value Ref Range   Iron 34 (L) 45 - 182 ug/dL   TIBC 207 (L) 250 - 450 ug/dL   Saturation Ratios 16 (L) 17.9 - 39.5 %   UIBC 173 ug/dL  Ferritin     Status: Abnormal   Collection Time: 20-Aug-2021  3:40 AM  Result Value Ref Range   Ferritin 509 (H) 24 - 336 ng/mL  Reticulocytes     Status: Abnormal   Collection Time: 20-Aug-2021  3:40 AM  Result Value Ref Range   Retic Ct Pct 2.7 0.4 - 3.1 %   RBC. 3.45 (L) 4.22 - 5.81 MIL/uL   Retic Count, Absolute 92.8 19.0 - 186.0 K/uL  Immature Retic Fract 33.3 (H) 2.3 - 15.9 %  Hemoglobin A1c     Status: Abnormal   Collection Time: 26-Aug-2021  3:40 AM  Result Value Ref Range   Hgb A1c MFr Bld 11.3 (H) 4.8 - 5.6 %   Mean Plasma Glucose 277.61 mg/dL  Folate     Status: None   Collection Time: 08/26/21  3:40 AM  Result Value Ref Range   Folate 12.3 >5.9 ng/mL  Glucose, capillary     Status: Abnormal   Collection Time: 08-26-2021  9:03 AM  Result Value Ref Range   Glucose-Capillary 286 (H) 70 - 99 mg/dL     Imaging or Labs ordered: None  Medical history and chart was reviewed and case discussed with medical provider.  Assessment/Plan: 60 year old male with metastatic lung cancer with left periprosthetic intertrochanteric femur fracture.  Due to the unstable nature of his injury I recommend proceeding with removal of hardware and cephalomedullary nailing of his left hip.  He does have poor ejection fraction and significant cardiac history however I feel that his ability ambulate and mobilize with his fracture is limited.   I feel that he will be bed ridden and significant amount of pain for the remainder of his life if we do not fix the femur.  I discussed risks and benefits with the patient.  Risks included but not limited to bleeding, infection, nerve or blood vessel injury, malunion, nonunion, hardware failure, hardware irritation, and even the possibility anesthetic complications.  The patient agreed to proceed with surgery and consent was obtained.  He understands the risks with his heart and wishes to proceed as noted above.  Shona Needles, MD [facility] [phone] (office) [email or web address]

## 2021-08-13 NOTE — Transfer of Care (Signed)
Immediate Anesthesia Transfer of Care Note  Patient: Dennis [name].  Procedure(s) Performed: INTRAMEDULLARY (IM) NAIL FEMORAL (Left)  Time of death 75

## 2021-08-13 NOTE — Progress Notes (Signed)
This Probation officer has been informed by OR staff that pt expired, and OR staff will come up and pick up pt's personal belongings.

## 2021-08-13 NOTE — Op Note (Signed)
Orthopaedic Surgery Operative Note (CSN: [id number] ) Date of Surgery: 2021/09/03  Admit Date: 07/25/2021   Diagnoses: Pre-Op Diagnoses: Left periprosthetic proximal femur fracture  Post-Op Diagnosis: Same  Procedures: CPT 27245-Cephalomedullary nailing of left intertrochanteric femur fracture CPT 20680-Removal of hardware left femur   Surgeons : Primary: Shona Needles, MD  Assistant: Patrecia Pace, PA-C  Location: OR 3   Anesthesia:General   Antibiotics: Ancef 2g preop    Tourniquet time:None    Estimated Blood QAST:41 mL  Complications:Patient become hypotensive and due to his DNR status the patient expired after terminal extubation after multiple attempts by anesthesia to correct his significant hypotension and cardiac rhythms with medications.  Specimens:* No specimens in log *   Implants: Implant Name Type Inv. Item Serial No. Manufacturer Lot No. LRB No. Used Action  TRIGEN INTERTAN 11.59mm x 40 cm 130 left    [facility] [employer] [id number] Left 1 Implanted  SCREW LAG TRIGEN INTERLOCK - [id number] Screw SCREW LAG TRIGEN INTERLOCK  [facility] AND [employer] [id number] Left 1 Implanted  SCREW TRIGEN LOW PROF 5.0X47.5 - [id number] Screw SCREW TRIGEN LOW PROF 5.0X47.5  [employer] AND [employer] [id number] Left 1 Implanted  SCREW TRIGEN LOW PROF 5.0X47.5 - [id number] Screw SCREW TRIGEN LOW PROF 5.0X47.5  [employer] AND [employer] [id number] Left 1 Implanted     Indications for Surgery: 60 year old male who has metastatic lung cancer that had sustained a left femoral shaft fracture in January of this year.  He sustained a periprosthetic intertrochanteric femur fracture at the proximal portion of his retrograde intramedullary nail.  He was ambulatory prior to this injury and due to his significant displacement and unstable nature of his injury I recommended proceeding with removal of hardware and cephalomedullary nailing of his left femur.  I discussed with him risks and  benefits.  Risks included but not limited to bleeding, infection, malunion, nonunion, hardware failure, hardware irritation, nerve or blood vessel injury, DVT, even possibility of anesthetic complications.  I discussed with him the significant risk of anesthetic complications secondary to his cardiac issues.  He understood this and wishes to proceed.  Operative Findings: 1.  Removal of previous retrograde intramedullary nailing. 2.  Cephalomedullary nailing of left intertrochanteric femur fracture using [employer] InterTAN 11.5x400 mm  Procedure: The patient was identified in the preoperative holding area. Consent was confirmed with the patient and their family and all questions were answered. The operative extremity was marked after confirmation with the patient. he was then brought back to the operating room by our anesthesia colleagues.  He was placed under general anesthetic and carefully transferred over to a radiolucent flat top table.  A bump was placed under his operative hip.  The left lower extremity was then prepped and draped in usual sterile fashion.  A timeout was performed to verify the patient, the procedure, and the extremity.  Preoperative antibiotics were dosed.  Fluoroscopic imaging showed the unstable nature of his injury.  For started out by flexing his hip and knee over a triangle.  I made incisions through his previous scars proximally to remove the proximal interlocking screws.  This was done without difficulty.  I then proceeded to remove the most distal lateral to medial interlock screw.  I then opened up the medial parapatellar incision and placed in the extraction bolt into the center of the nail and then proceeded to remove the other 2 distal interlocking screws.  I then was able to successfully remove the nail without difficulty.  Traction  was applied by my assistant and a small incision was made percutaneous proximal to the greater trochanter.  I directed a threaded  guidewire at the tip of the greater trochanter into the proximal metaphysis.  I confirmed positioning with fluoroscopy.  I then used an entry reamer to enter the medullary canal.  I then passed a ball-tipped guidewire down the center canal and seated into the distal metaphysis.  I measured the length and chose to use a 400 mm nail.  I then reamed with an 11.5 mm reamer and did not obtain any significant chatter.  I decided to place an 11.5 mm nail.  This was placed down the center of the canal and then I used a lateral incision to percutaneously place a threaded guidewire into the head neck segment.  I confirmed adequate tip apex distance and measured the length.  I then drilled the compression screw path and placed an antirotation bar I then drilled and placed the lag screw.  I then compressed approximately 58mm through the fracture to be able to medialize the nail.  I then statically locked it proximally.  I then placed 2 lateral to medial distal interlocking screws.  Final fluoroscopic imaging was obtained.  The incision was then copiously irrigated.  Layered closure of 2-0 Vicryl and 3-0 Monocryl was used to close the skin.  Dermabond was used to seal the skin and sterile dressings were placed.  During closure the patient started to have significant drop in his blood pressure.  Despite multiple attempts with medication assistance he developed significant arrhythmias.  It was determined due to his prolonged hypotension with his DNR status that survival would not be likely.  At this point I discussed his care with his brother over the phone and I went up to bring the brother to so we could see Mr. Scheuring prior to terminal extubation.  Mr. Veley was then brought to the PACU at which point his brother was able to see him in terminal extubation was then performed.  Time of death was then determined at 18:21.  I was present and performed the entire surgery.  Patrecia Pace, PA-C did assist me throughout the case. An  assistant was necessary given the difficulty in approach, maintenance of reduction and ability to instrument the fracture.   Katha Hamming, MD [facility]

## 2021-08-13 NOTE — Interval H&P Note (Signed)
History and Physical Interval Note:  2021-09-02 3:24 PM  Dennis [name].  has presented today for surgery, with the diagnosis of Left hip fracture.  The various methods of treatment have been discussed with the patient and family. After consideration of risks, benefits and other options for treatment, the patient has consented to  Removal of hardware and intramedullary nailing of left hip as a surgical intervention.  The patient's history has been reviewed, patient examined, no change in status, stable for surgery.  I have reviewed the patient's chart and labs.  Questions were answered to the patient's satisfaction.     Lennette Bihari P Bari Leib

## 2021-08-13 NOTE — Consult Note (Signed)
[facility] (OTS) Consult   Patient ID: Dennis Mom Jr. MRN: [id number] DOB/AGE: 04/05/1961 60 y.o.  Reason for Consult:Left periprosthetic intertrochanteric femur fracture Referring Physician: Dr. Omair Sheikh, MD [facility]  HPI: Dennis Obriant Jr. is an 60 y.o. male being seen in consultation at the request of Dr. Sheikh for evaluation of left periprosthetic intertrochanteric femur fracture.  Patient is known to me from her previous left femur fracture that was treated with a retrograde IM nailing.  He had done decent regarding this and was ambulating with the use of a walker.  He does have metastatic lung cancer for which she is on hospice.  He was walking around and fell landed on his hip and developed immediate pain and deformity.  X-ray showed a periprosthetic intertrochanteric femur fracture.  I was not on-call but was contacted by the on-call provider Dr. Lucey.  Patient was seen and evaluated on 5 N.  Currently comfortable.  Not having much pain but has significant pain when he tries to move.  Denies any numbness or tingling.  States that he would like to get it fixed as he was walking prior to his injury.  Denies any significant numbness or tingling in his lower extremity.  Past Medical History:  Diagnosis Date   Alcohol abuse with alcohol-induced mood disorder (HCC) 08/18/2015   Automatic implantable cardioverter-defibrillator in situ 2012   S/P [employer] Dual Chamber. ICD.   CAD (coronary artery disease) 11/16/2014   CAD Coronary angiography (12/2008) with mid LAD totally occluded and collateralized.   Cardiac LV ejection fraction 10-20%    Chest pain 09/04/2018   CHF (congestive heart failure) (HCC)    Chronic systolic heart failure (HCC)    a) Mixed ICM/NICM b) RHC (05/2014): RA 2, RV 19/2/3, PA 22/14 (18), PCWP 6, Fick CO/CI: 5.2 / 2.7, PVR 2.3 WU, PA 60% and 64% c) ECHO (05/2014): EF 20-25%, diff HK, akinesis entireanteroseptal myocardium, triv AI, mod MR, LA  mod/sev dilated   Cocaine abuse (HCC) 01/24/2015   Cocaine abuse with cocaine-induced mood disorder (HCC) 08/20/2015   Drug abuse and dependence (HCC)    Dysphagia following cerebral infarction 01/24/2015   Embolic stroke involving right middle cerebral artery (HCC)    Extensive stage primary small cell carcinoma of lung (HCC) 01/04/2020   H/O noncompliance with medical treatment, presenting hazards to health 01/24/2015   Heart murmur    High cholesterol    Ischemic cardiomyopathy    a) Coronary angiography (12/2008) at [facility]: Lmain: nl, LAD mid 100% stenosis with left to left and right-to-left collaterals to the distal LAD; Lcx: nl, RCA nl.     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HCC)    MDD (major depressive disorder), recurrent severe, without psychosis (HCC) 08/18/2015   Open wound of foot 02/27/2015   Pneumonia 1988   Psychoactive substance-induced mood disorder (HCC) 07/17/2015   Sleep apnea    "cleared after T&A"   Status post PICC central line placement    Stroke (HCC)    Substance induced mood disorder (HCC) 08/17/2015   Type II diabetes mellitus (HCC)     Past Surgical History:  Procedure Laterality Date   BRONCHIAL BRUSHINGS  12/28/2019   Procedure: BRONCHIAL BRUSHINGS;  Surgeon: Icard, Bradley L, DO;  Location: [facility];  Service: Thoracic;;   BRONCHIAL NEEDLE ASPIRATION BIOPSY  12/28/2019   Procedure: BRONCHIAL NEEDLE ASPIRATION BIOPSIES;  Surgeon: Icard, Bradley L, DO;  Location: [facility];  Service: Thoracic;;   BRONCHIAL WASHINGS  12/28/2019     Procedure: BRONCHIAL WASHINGS;  Surgeon: Icard, Bradley L, DO;  Location: [facility];  Service: Thoracic;;   CARDIAC CATHETERIZATION  05/2014   CARDIAC DEFIBRILLATOR PLACEMENT  03/2011   CHOLECYSTECTOMY  1994   FEMUR IM NAIL Left 10/31/2020   Procedure: INTRAMEDULLARY (IM) RETROGRADE FEMORAL NAILING;  Surgeon: Aftan Vint P, MD;  Location: [facility] OR;  Service: Orthopedics;  Laterality: Left;   FOREARM FRACTURE SURGERY Left 1980    FRACTURE SURGERY     IR IMAGING GUIDED PORT INSERTION  10/11/2020   RIGHT HEART CATHETERIZATION N/A 05/30/2014   Procedure: RIGHT HEART CATH;  Surgeon: Daniel R Bensimhon, MD;  Location: [facility];  Service: Cardiovascular;  Laterality: N/A;   RIGHT HEART CATHETERIZATION N/A 02/07/2015   Procedure: RIGHT HEART CATH;  Surgeon: Daniel R Bensimhon, MD;  Location: [facility];  Service: Cardiovascular;  Laterality: N/A;   TONSILLECTOMY AND ADENOIDECTOMY  ~ 1998   VIDEO BRONCHOSCOPY WITH ENDOBRONCHIAL ULTRASOUND N/A 12/28/2019   Procedure: VIDEO BRONCHOSCOPY WITH ENDOBRONCHIAL ULTRASOUND;  Surgeon: Icard, Bradley L, DO;  Location: [facility];  Service: Thoracic;  Laterality: N/A;    Family History  Problem Relation Age of Onset   Diabetes Mother    Heart disease Mother    Diabetes Father    Prostate cancer Father    Heart disease Father    Diabetes Brother    Diabetes Brother     Social History:  reports that he has quit smoking. His smoking use included cigarettes. He has a 7.75 pack-year smoking history. He has never used smokeless tobacco. He reports that he does not currently use alcohol. He reports current drug use. Drug: Cocaine.  Allergies: No Known Allergies  Medications:  No current facility-administered medications on file prior to encounter.   Current Outpatient Medications on File Prior to Encounter  Medication Sig Dispense Refill   acetaminophen (TYLENOL) 325 MG tablet Take 2 tablets (650 mg total) by mouth every 6 (six) hours as needed for mild pain (or Fever >/= 101).     atorvastatin (LIPITOR) 20 MG tablet TAKE 1 TABLET BY MOUTH DAILY AT 6 PM. (Patient taking differently: Take 20 mg by mouth every evening.) 30 tablet 6   Blood Glucose Monitoring Suppl (FIFTY50 GLUCOSE METER 2.0) w/Device KIT Check blood sugar 3 times a week     budesonide-formoterol (SYMBICORT) 160-4.5 MCG/ACT inhaler Inhale 2 puffs into the lungs 2 (two) times daily. 1 each 12   cholecalciferol  (VITAMIN D) 25 MCG (1000 UNIT) tablet Take 2,000 Units by mouth in the morning and at bedtime.     dexamethasone (DECADRON) 1 MG tablet Take 1 tablet (1 mg total) by mouth daily. 60 tablet 2   furosemide (LASIX) 20 MG tablet Take 20 mg by mouth daily.     HYDROcodone-acetaminophen (NORCO/VICODIN) 5-325 MG tablet Take 1-2 tablets by mouth every 4 (four) hours as needed for moderate pain. 30 tablet 0   insulin glargine (LANTUS SOLOSTAR) 100 UNIT/ML Solostar Pen Inject 25 Units into the skin daily. (Patient taking differently: Inject 25 Units into the skin 2 (two) times daily.) 15 mL 2   insulin lispro (HUMALOG KWIKPEN) 100 UNIT/ML KwikPen Inject 6 Units into the skin 3 (three) times daily. (Patient taking differently: Inject 6 Units into the skin 2 (two) times daily.) 15 mL 1   INSUPEN ULTRAFIN 31G X 8 MM MISC      methocarbamol (ROBAXIN) 500 MG tablet Take 1 tablet (500 mg total) by mouth every 6 (six) hours as needed for   muscle spasms.     midodrine (PROAMATINE) 10 MG tablet Take 1 tablet (10 mg total) by mouth 3 (three) times daily with meals. (Patient taking differently: Take 10 mg by mouth 3 (three) times daily.) 90 tablet 2   Multiple Vitamin (MULTIVITAMIN) tablet Take 1 tablet by mouth daily.     potassium chloride SA (KLOR-CON) 20 MEQ tablet Take 20 mEq by mouth daily.     prochlorperazine (COMPAZINE) 10 MG tablet Take 10 mg by mouth every 6 (six) hours as needed for nausea or vomiting.     senna (SENOKOT) 8.6 MG TABS tablet Take 1 tablet (8.6 mg total) by mouth 2 (two) times daily. (Patient taking differently: Take 1 tablet by mouth daily as needed for mild constipation.) 120 tablet 0   tamsulosin (FLOMAX) 0.4 MG CAPS capsule Take 0.4 mg by mouth daily.     tiotropium (SPIRIVA HANDIHALER) 18 MCG inhalation capsule Place 1 capsule (18 mcg total) into inhaler and inhale daily. 90 capsule 3   enoxaparin (LOVENOX) 80 MG/0.8ML injection Inject 0.75 mLs (75 mg total) into the skin every 12 (twelve)  hours.**Pt must waste 0.5 mLs before injecting.** (Patient not taking: No sig reported) 48 mL 0   feeding supplement (ENSURE ENLIVE / ENSURE PLUS) LIQD Take 237 mLs by mouth 2 (two) times daily between meals. (Patient not taking: No sig reported) 237 mL 0     ROS: Constitutional: No fever or chills Vision: No changes in vision ENT: No difficulty swallowing CV: No chest pain Pulm: No SOB or wheezing GI: No nausea or vomiting GU: No urgency or inability to hold urine Skin: No poor wound healing Neurologic: No numbness or tingling Psychiatric: No depression or anxiety Heme: No bruising Allergic: No reaction to medications or food   Exam: Blood pressure 109/88, pulse (!) 102, temperature 98 F (36.7 C), resp. rate 18, SpO2 97 %. General: No acute distress Orientation: Awake alert and oriented x3 Mood and Affect: Cooperative and pleasant Gait: Unable to assess due to his fracture Coordination and balance: Within normal limits  Left lower extremity: Surgical incisions from previous have healed.  Leg is shortened and externally rotated.  Active dorsiflexion plantarflexion of his toes and ankle.  He has sensation intact to light touch to the dorsum and plantar aspect of his foot.  He has a warm well-perfused foot.  He does have some pitting edema in his lower extremity.  Right lower extremity: Skin without lesions. No tenderness to palpation. Full painless ROM, full strength in each muscle groups without evidence of instability.   Medical Decision Making: Data: Imaging: X-rays show a comminuted intertrochanteric femur fracture at the proximal portion of the retrograde intramedullary nail.  Labs:  Results for orders placed or performed during the hospital encounter of 08/04/2021 (from the past 24 hour(s))  Glucose, capillary     Status: Abnormal   Collection Time: 08/04/21 12:43 PM  Result Value Ref Range   Glucose-Capillary 356 (H) 70 - 99 mg/dL  Glucose, capillary     Status:  Abnormal   Collection Time: 08/04/21  4:20 PM  Result Value Ref Range   Glucose-Capillary 374 (H) 70 - 99 mg/dL  Glucose, capillary     Status: Abnormal   Collection Time: 08/04/21  9:58 PM  Result Value Ref Range   Glucose-Capillary 473 (H) 70 - 99 mg/dL  Glucose, capillary     Status: Abnormal   Collection Time: 08/04/21 11:57 PM  Result Value Ref Range   Glucose-Capillary 455 (H)  70 - 99 mg/dL  CBC with Differential/Platelet     Status: Abnormal   Collection Time: 08/10/2021  3:40 AM  Result Value Ref Range   WBC 12.3 (H) 4.0 - 10.5 K/uL   RBC 3.47 (L) 4.22 - 5.81 MIL/uL   Hemoglobin 10.0 (L) 13.0 - 17.0 g/dL   HCT 32.3 (L) 39.0 - 52.0 %   MCV 93.1 80.0 - 100.0 fL   MCH 28.8 26.0 - 34.0 pg   MCHC 31.0 30.0 - 36.0 g/dL   RDW 16.2 (H) 11.5 - 15.5 %   Platelets 163 150 - 400 K/uL   nRBC 0.4 (H) 0.0 - 0.2 %   Neutrophils Relative % 79 %   Neutro Abs 9.6 (H) 1.7 - 7.7 K/uL   Lymphocytes Relative 10 %   Lymphs Abs 1.2 0.7 - 4.0 K/uL   Monocytes Relative 10 %   Monocytes Absolute 1.3 (H) 0.1 - 1.0 K/uL   Eosinophils Relative 0 %   Eosinophils Absolute 0.1 0.0 - 0.5 K/uL   Basophils Relative 0 %   Basophils Absolute 0.0 0.0 - 0.1 K/uL   Immature Granulocytes 1 %   Abs Immature Granulocytes 0.11 (H) 0.00 - 0.07 K/uL  Comprehensive metabolic panel     Status: Abnormal   Collection Time: 07/30/2021  3:40 AM  Result Value Ref Range   Sodium 131 (L) 135 - 145 mmol/L   Potassium 4.1 3.5 - 5.1 mmol/L   Chloride 96 (L) 98 - 111 mmol/L   CO2 26 22 - 32 mmol/L   Glucose, Bld 300 (H) 70 - 99 mg/dL   BUN 32 (H) 6 - 20 mg/dL   Creatinine, Ser 1.39 (H) 0.61 - 1.24 mg/dL   Calcium 8.5 (L) 8.9 - 10.3 mg/dL   Total Protein 5.7 (L) 6.5 - 8.1 g/dL   Albumin 2.1 (L) 3.5 - 5.0 g/dL   AST 14 (L) 15 - 41 U/L   ALT 12 0 - 44 U/L   Alkaline Phosphatase 86 38 - 126 U/L   Total Bilirubin 1.0 0.3 - 1.2 mg/dL   GFR, Estimated 58 (L) >60 mL/min   Anion gap 9 5 - 15  Magnesium     Status: None    Collection Time: 07/25/2021  3:40 AM  Result Value Ref Range   Magnesium 1.9 1.7 - 2.4 mg/dL  Phosphorus     Status: None   Collection Time: 08/08/2021  3:40 AM  Result Value Ref Range   Phosphorus 3.1 2.5 - 4.6 mg/dL  Vitamin B12     Status: Abnormal   Collection Time: 07/24/2021  3:40 AM  Result Value Ref Range   Vitamin B-12 933 (H) 180 - 914 pg/mL  Iron and TIBC     Status: Abnormal   Collection Time: 08/04/2021  3:40 AM  Result Value Ref Range   Iron 34 (L) 45 - 182 ug/dL   TIBC 207 (L) 250 - 450 ug/dL   Saturation Ratios 16 (L) 17.9 - 39.5 %   UIBC 173 ug/dL  Ferritin     Status: Abnormal   Collection Time: 07/25/2021  3:40 AM  Result Value Ref Range   Ferritin 509 (H) 24 - 336 ng/mL  Reticulocytes     Status: Abnormal   Collection Time: 07/13/2021  3:40 AM  Result Value Ref Range   Retic Ct Pct 2.7 0.4 - 3.1 %   RBC. 3.45 (L) 4.22 - 5.81 MIL/uL   Retic Count, Absolute 92.8 19.0 - 186.0 K/uL     Immature Retic Fract 33.3 (H) 2.3 - 15.9 %  Hemoglobin A1c     Status: Abnormal   Collection Time: 2021/08/21  3:40 AM  Result Value Ref Range   Hgb A1c MFr Bld 11.3 (H) 4.8 - 5.6 %   Mean Plasma Glucose 277.61 mg/dL  Folate     Status: None   Collection Time: 21-Aug-2021  3:40 AM  Result Value Ref Range   Folate 12.3 >5.9 ng/mL  Glucose, capillary     Status: Abnormal   Collection Time: Aug 21, 2021  9:03 AM  Result Value Ref Range   Glucose-Capillary 286 (H) 70 - 99 mg/dL     Imaging or Labs ordered: None  Medical history and chart was reviewed and case discussed with medical provider.  Assessment/Plan: 60 year old male with metastatic lung cancer with left periprosthetic intertrochanteric femur fracture.  Due to the unstable nature of his injury I recommend proceeding with removal of hardware and cephalomedullary nailing of his left hip.  He does have poor ejection fraction and significant cardiac history however I feel that his ability ambulate and mobilize with his fracture is limited.   I feel that he will be bed ridden and significant amount of pain for the remainder of his life if we do not fix the femur.  I discussed risks and benefits with the patient.  Risks included but not limited to bleeding, infection, nerve or blood vessel injury, malunion, nonunion, hardware failure, hardware irritation, and even the possibility anesthetic complications.  The patient agreed to proceed with surgery and consent was obtained.  He understands the risks with his heart and wishes to proceed as noted above.  Shona Needles, MD [facility] [phone] (office) [email or web address]

## 2021-08-13 NOTE — Progress Notes (Signed)
PROGRESS NOTE    Dennis [name].  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  Patient stated that approximately around 5 AM he was in his normal state of health when he got up the bed to the kitchen to get a glass of water.  He typically uses a walker to ambulate unfortunately while having aggressive water in his hand he dropped a and fell on the water I was on the floor landing on his left hip immediately.  Patient developed severe left hip pain and described it as sharp in quality and worse with movement without any radiation.  He denies any lightheadedness or dizziness or loss of consciousness prior to his fall.  Because of his severe pain that persisted throughout the day and inability to ambulate the patient's brother brought patient's mother to [facility] for further evaluation.  In the ED he underwent imaging of the left femur which revealed an intertrochanteric left femur fracture just above the femoral intramedullary nail.  The ED provider discussed with Dr. Ronnie Derby who felt that the patient was not an appropriate surgical candidate at this time but recommended being stabilized and having orthopedic trauma  surgery Dr. Doreatha Martin evaluate for possible surgical intervention on Monday.  Cardiology has been consulted for preoperative evaluation. Patient is a High risk for general Anesthesia and cardiology is recommending continuing Lasix 20 mg p.o. daily.  We will be obtaining a chest x-ray. Trauma Ortho evaluated and they believe that the patient would best be served by undergoing removal of his previously nail and cephalomedullary nailing of his hip fracture and feel that he will be bedridden nonambulatory the remainder of his life without surgery.   Assessment & Plan:   Principal Problem:   Closed fracture of distal end of left femur, unspecified fracture morphology, initial encounter ([place]) Active Problems:   Chronic combined systolic and diastolic CHF (congestive heart failure) (HCC)   Hyperlipidemia associated with type 2 diabetes mellitus (HCC)   Extensive stage primary small cell carcinoma of lung (HCC)   Goals of care, counseling/discussion   Type 2 diabetes mellitus with hyperglycemia, with long-term current use of insulin (HCC)   History of pulmonary embolism   Benign prostatic hyperplasia  Closed fracture of distal end of left femur, unspecified fracture morphology, initial encounter ([place]) -Patient presenting status post what he reports as a mechanical fall with left hip and thigh pain -Notable intertrochanteric left hip fracture just above the previously placed intramedullary nail (10/2020) which was performed by Dr. Doreatha Martin -ER provider discussed case with Dr. Ronnie Derby, on-call for orthopedic surgery who stated that they felt that the patient was not an appropriate candidate for surgical intervention but recommended that we reach out directly to Dr. Doreatha Martin to discuss further -ER provider has  attempted to reach Dr. Doreatha Martin but since he is not on-call attempt has been unsuccessful -Dr. Ronnie Derby has made Dr. Doreatha Martin aware and Ortho evaluation done and they feel the patient would best be served by  undergoing removal of his previously nail and cephalomedullary nailing of his hip fracture and feel that he will be bedridden nonambulatory the remainder of his life without surgery.  -WBC went from 14.3 -> 14.2 -> 12.8 -> 12.3 -In the meantime, will provide patient with as needed analgesics for substantial associated pain -Continue with diet for now and make him n.p.o. in anticipation for possible surgical procedure in the morning -We will also add enoxaparin subcu for DVT prophylaxis but hold for now   Extensive stage primary small cell carcinoma of lung ([place]) with metastasis to the brain -Advanced small cell lung cancer diagnosed 12/2019 -Known metastatic disease to the brain and he previously received radiation and chemotherapy -Patient is no longer receiving chemotherapy and is currently on hospice as he had a chest CT on 01/01/2021 which showed interval enlargement of a spiculated nodule consistent with worsening primary malignancy and newly enlarged paratracheal and subcarinal lymph nodes -Hospice is following and have added palliative care for further goals of care discussion -Has been following with Dr. Julien Nordmann as an outpatient the past and will notify him of the patient's hospitalization as a courtesy via epic    Goals of care, counseling/discussion -Patient suffers from multiple advanced medical problems including advanced cardiomyopathy and metastatic small cell lung cancer -Overall prognosis is extremely poor -Patient is currently receiving hospice services with [facility] -It has been confirmed that patient remains DNR -That being said, patient still wishes to pursue some sort of curative intervention for his left hip fracture as patient was ambulating with a cane or walker just prior to the fall; palliative care has been consulted for further goals of care discussion   Chronic combined systolic and diastolic CHF (congestive heart failure) with EF of 15% ([place]) CAD Ischemic  cardiomyopathy and CAD with a mid LAD that is totally occluded and collateralized Status post AICD placement; this has been deactivated as he is currently under hospice -No evidence of cardiogenic volume overload at this time. -BNP was elevated at 1645.9 today and Cardiology gave him IV Lasix 60 mg x1  -Patient had a cardiac cath in 12/2008 and a stress test 10/24/2017 with no ischemia noted -Strict I's and O's and Daily Weights: Weights not done accurately -The patient denies any cardiac symptoms and denies any chest pain -We will consult cardiology for further evaluation and for preoperative clearance; continue with Lasix 20 g p.o. daily and he was given IV Lasix as above and Cardiology recommends resuming po Lasix Post-Op -GDMT is very limited in the setting of his hypotension -Cardiology recommends chest x-ray and this is in the process of being done -Resumed the patient's Atorvastatin 20 mg po qHS  Severe Mitral Regurgitation -Neurology been consulted and this was noted to be on his echocardiogram on 01/01/2021.   -He is felt to be a poor candidate with his history of lung cancer and there is no plans for MitraClip per cardiology  Hypomagnesemia -Mag Level was 1.6 -> 2.2 -> 1.9 -Replete with IV Mag Sulfate 2 grams yesterday -Continue to Monitor and Replete as Necessary -Repeat Mag Level in the AM   Normocytic Anemia -Patient's Hgb/Hct went from 12.1/39.5 -> 11.7/39.0 -> 10.0/33.1 -> 10.0//32.3 -Checked Anemia Panel and showed iron level of 34, U IBC 173, TIBC 207, saturation was  16%, ferritin level 509, folate level 12.3, vitamin B12 933 -Continue to Monitor for S/Sx of Bleeding; No overt bleeding noted -Repeat CBC in the AM  CKD Stage 3a -Patient's BUN/Cr went from 19/1.62 -> 23/1.50 -> 26/1.46 -> 32/1.39 -Avoid Nephrotoxic Medications, Contrast Dyes, Hypotension and Renally Adjust Medications -C/w Lasix 20 mg po Daily  -Repeat CMP in the AM  Hyponatremia -Mild. Na+ went from  136 -> 137 -> 132 -> 131 -C/w po Lasix after surgery and given IV Lasix as above -Continue to Monitor and Trend -Repeat CMP in the AM   Hyperbilirubinemia -Patient's T Bili went from 2.3 -> 2.5 -> 2.1 -> 1.0 -Likely Reactive -Continue to Monitor and Replete as Necessary -Repeat CMP in the AM  Type 2 Diabetes Mellitus with Hyperglycemia, with long-term current use of insulin ([place]) -Patient been placed on Accu-Cheks before every meal and nightly with sliding scale insulin that is a moderate NovoLog intensity but changed to Sensitive Novolog SSI q4h -Continuing home regimen of basal insulin therapy reduce the dose from 25 units SQ twice daily to 15 units SQ twice daily -Hemoglobin A1C ordered and pending; Last HbA1c was 8.7 -Heart Healthy Diabetic Diet -CBGs ranging from 198-473    History of Pulmonary Embolism -Bilateral pulmonary emboli diagnosed in 02/2021 -Of note, patient additionally has a remote history of DVT -Patient was on subcutaneous Lovenox after his hospitalization in May however this has since been discontinued since the patient has been entered into hospice care. -Patient currently not exhibiting any shortness of breath or oxygen requirement   Hyperlipidemia associated with type 2 diabetes mellitus ([place]) -Continuing home regimen of lipid lowering therapy with atorvastatin 20 mg p.o. nightly   Benign Prostatic Hyperplasia -Continue home regimen of tamsulosin 0.4 mg p.o. daily   DVT prophylaxis: Enoxaparin 40 mg sq q24h Code Status: FULL CODE Family Communication: No family present at bedside  Disposition Plan: Pending further clinical improvement and evaluation by Orthopedic Surgery and Cardiology   Status is: Inpatient  Remains inpatient appropriate because: Patient has a Hip Fracture that needs evaluation by Ortho and Clearance by Cardiology   Consultants:  Orthopedic Surgery Cardiology  Procedures:  None  Antimicrobials:  Anti-infectives (From admission,  onward)    None       Subjective: Seen and examined at bedside and he continues to have pain that is about the same with little to no improvement.  No nausea or vomiting.  Understands he will be going for surgical intervention later today.  No lightheadedness or dizziness.  No other concerns or complaints this time.  Objective: Vitals:   August 28, 2021 0806 28-Aug-2021 0913 2021-08-28 1047 08-28-2021 1136  BP: 109/88   105/83  Pulse: 100 (!) 102  100  Resp: 18 18  17   Temp: 98 F (36.7 C)   97.6 F (36.4 C)  TempSrc:      SpO2: 97% 97%  100%  Weight:   78.8 kg   Height:   6' (1.829 m)    No intake or output data in the 24 hours ending 08/28/2021 1154  Filed Weights   08-28-21 1047  Weight: 78.8 kg   Examination: Physical Exam:  Constitutional: Patient is a thin chronically ill-appearing African-American male currently no acute distress appears a little uncomfortable  Eyes: Lids and conjunctivae normal, sclerae anicteric  ENMT: External Ears, Nose appear normal. Grossly normal hearing. Mucous membranes are moist.  Neck: Appears normal, supple, no cervical masses, normal ROM, no appreciable thyromegaly; no JVD Respiratory: Diminished to  auscultation bilaterally, no wheezing, rales, rhonchi or crackles. Normal respiratory effort and patient is not tachypenic. No accessory muscle use.  He is wearing supplemental oxygen via nasal cannula Cardiovascular: Mildly tachycardic, no murmurs / rubs / gallops. S1 and S2 auscultated.  Some mild lower extremity edema Abdomen: Soft, non-tender, non-distended. Bowel sounds positive.  GU: Deferred. Musculoskeletal: No clubbing / cyanosis of digits/nails.  Left leg is externally rotated and shortened Skin: No rashes, lesions, ulcers on limited skin evaluation. No induration; Warm and dry.  Neurologic: CN 2-12 grossly intact with no focal deficits. Romberg sign and cerebellar reflexes not assessed.  Psychiatric: Normal judgment and insight. Alert and oriented  x 3. Normal mood and appropriate affect.   Data Reviewed: I have personally reviewed following labs and imaging studies  CBC: Recent Labs  Lab 07/29/2021 1653 08/03/21 1000 08/04/21 0428 2021-08-27 0340  WBC 14.3* 14.2* 12.8* 12.3*  NEUTROABS 11.8* 11.5* 10.7* 9.6*  HGB 12.1* 11.7* 10.0* 10.0*  HCT 39.5 39.0 33.1* 32.3*  MCV 95.0 96.5 94.8 93.1  PLT 134* 128* 143* 976    Basic Metabolic Panel: Recent Labs  Lab 08/06/2021 1653 08/03/21 1000 08/04/21 0428 2021/08/27 0340  NA 136 137 132* 131*  K 4.7 4.4 4.5 4.1  CL 103 103 98 96*  CO2 22 23 24 26   GLUCOSE 262* 261* 375* 300*  BUN 19 23* 26* 32*  CREATININE 1.62* 1.50* 1.46* 1.39*  CALCIUM 8.7* 8.6* 8.2* 8.5*  MG  --  1.6* 2.2 1.9  PHOS  --  3.5 3.3 3.1    GFR: Estimated Creatinine Clearance: 62.8 mL/min (A) (by C-G formula based on SCr of 1.39 mg/dL (H)). Liver Function Tests: Recent Labs  Lab 07/14/2021 1653 08/03/21 1000 08/04/21 0428 08-27-2021 0340  AST 17 15 13* 14*  ALT 14 14 10 12   ALKPHOS 118 107 103 86  BILITOT 2.3* 2.5* 2.1* 1.0  PROT 6.0* 6.0* 5.4* 5.7*  ALBUMIN 2.7* 2.5* 2.1* 2.1*    No results for input(s): LIPASE, AMYLASE in the last 168 hours. No results for input(s): AMMONIA in the last 168 hours. Coagulation Profile: Recent Labs  Lab 07/15/2021 1653  INR 1.3*    Cardiac Enzymes: No results for input(s): CKTOTAL, CKMB, CKMBINDEX, TROPONINI in the last 168 hours. BNP (last 3 results) No results for input(s): PROBNP in the last 8760 hours. HbA1C: Recent Labs    08-27-2021 0340  HGBA1C 11.3*   CBG: Recent Labs  Lab 08/04/21 1620 08/04/21 2158 08/04/21 2357 2021-08-27 0903 August 27, 2021 1142  GLUCAP 374* 473* 455* 286* 198*   Lipid Profile: No results for input(s): CHOL, HDL, LDLCALC, TRIG, CHOLHDL, LDLDIRECT in the last 72 hours. Thyroid Function Tests: No results for input(s): TSH, T4TOTAL, FREET4, T3FREE, THYROIDAB in the last 72 hours. Anemia Panel: Recent Labs    08/27/2021 0340   VITAMINB12 933*  FOLATE 12.3  FERRITIN 509*  TIBC 207*  IRON 34*  RETICCTPCT 2.7   Sepsis Labs: No results for input(s): PROCALCITON, LATICACIDVEN in the last 168 hours.  Recent Results (from the past 240 hour(s))  Resp Panel by RT-PCR (Flu A&B, Covid) Nasopharyngeal Swab     Status: None   Collection Time: 08/04/2021  4:56 PM   Specimen: Nasopharyngeal Swab; Nasopharyngeal(NP) swabs in vial transport medium  Result Value Ref Range Status   SARS Coronavirus 2 by RT PCR NEGATIVE NEGATIVE Final    Comment: (NOTE) SARS-CoV-2 target nucleic acids are NOT DETECTED.  The SARS-CoV-2 RNA is generally detectable in upper respiratory  specimens during the acute phase of infection. The lowest concentration of SARS-CoV-2 viral copies this assay can detect is 138 copies/mL. A negative result does not preclude SARS-Cov-2 infection and should not be used as the sole basis for treatment or other patient management decisions. A negative result may occur with  improper specimen collection/handling, submission of specimen other than nasopharyngeal swab, presence of viral mutation(s) within the areas targeted by this assay, and inadequate number of viral copies(<138 copies/mL). A negative result must be combined with clinical observations, patient history, and epidemiological information. The expected result is Negative.  Fact Sheet for Patients:  EntrepreneurPulse.com.au  Fact Sheet for Healthcare Providers:  IncredibleEmployment.be  This test is no t yet approved or cleared by the [place] FDA and  has been authorized for detection and/or diagnosis of SARS-CoV-2 by FDA under an Emergency Use Authorization (EUA). This EUA will remain  in effect (meaning this test can be used) for the duration of the COVID-19 declaration under Section 564(b)(1) of the Act, 21 U.S.C.section 360bbb-3(b)(1), unless the authorization is terminated  or revoked sooner.        Influenza A by PCR NEGATIVE NEGATIVE Final   Influenza B by PCR NEGATIVE NEGATIVE Final    Comment: (NOTE) The Xpert Xpress SARS-CoV-2/FLU/RSV plus assay is intended as an aid in the diagnosis of influenza from Nasopharyngeal swab specimens and should not be used as a sole basis for treatment. Nasal washings and aspirates are unacceptable for Xpert Xpress SARS-CoV-2/FLU/RSV testing.  Fact Sheet for Patients: EntrepreneurPulse.com.au  Fact Sheet for Healthcare Providers: IncredibleEmployment.be  This test is not yet approved or cleared by the [place] FDA and has been authorized for detection and/or diagnosis of SARS-CoV-2 by FDA under an Emergency Use Authorization (EUA). This EUA will remain in effect (meaning this test can be used) for the duration of the COVID-19 declaration under Section 564(b)(1) of the Act, 21 U.S.C. section 360bbb-3(b)(1), unless the authorization is terminated or revoked.  Performed at [facility], [place] [address] [place], [place] [address]   Surgical pcr screen     Status: None   Collection Time: 2021-08-18  8:55 AM   Specimen: Nasal Mucosa; Nasal Swab  Result Value Ref Range Status   MRSA, PCR NEGATIVE NEGATIVE Final   Staphylococcus aureus NEGATIVE NEGATIVE Final    Comment: (NOTE) The Xpert SA Assay (FDA approved for NASAL specimens in patients 58 years of age and older), is one component of a comprehensive surveillance program. It is not intended to diagnose infection nor to guide or monitor treatment. Performed at [facility], [place] [address] [place], [place] [address]      RN Pressure Injury Documentation:     Estimated body mass index is 23.57 kg/m as calculated from the following:   Height as of this encounter: 6' (1.829 m).   Weight as of this encounter: 78.8 kg.  Malnutrition Type:   Malnutrition Characteristics:   Nutrition Interventions:    Radiology Studies: DG Chest  2 View  Result Date: 08/04/2021 CLINICAL DATA:  Congestive heart failure.  Diabetes. EXAM: CHEST - 2 VIEW COMPARISON:  12/28/2020 FINDINGS: Cardiomegaly as seen previously. Aortic atherosclerosis. Pacemaker/AICD in place. Power port in place from a right-sided approach with tip in the SVC above the right atrium. Pulmonary edema seen previously is not present today. Small amount of fluid in the fissures. No infiltrate, collapse or measurable effusion. IMPRESSION: Cardiomegaly and aortic atherosclerosis. Pacemaker/AICD. No pulmonary edema today. Small amount of fluid  in the fissures. Electronically Signed   By: Nelson Chimes M.D.   On: 08/04/2021 12:46    Scheduled Meds:  atorvastatin  20 mg Oral QPM   Chlorhexidine Gluconate Cloth  6 each Topical Daily   cholecalciferol  2,000 Units Oral BID AC & HS   dexamethasone  1 mg Oral Daily   enoxaparin (LOVENOX) injection  40 mg Subcutaneous Q24H   furosemide  20 mg Oral Daily   insulin aspart  0-9 Units Subcutaneous Q4H   insulin glargine-yfgn  15 Units Subcutaneous Daily   midodrine  10 mg Oral TID   mometasone-formoterol  2 puff Inhalation BID   sodium chloride flush  10-40 mL Intracatheter Q12H   tamsulosin  0.4 mg Oral Daily   umeclidinium bromide  1 puff Inhalation Daily   Continuous Infusions:   LOS: 2 days   Kerney Elbe, DO [facility] PAGER is on AMION  If 7PM-7AM, please contact night-coverage [email or web address]

## 2021-08-13 NOTE — TOC CAGE-AID Note (Signed)
Transition of Care (TOC) - CAGE-AID Screening   Patient Details  Name: Dennis [name]. MRN: [id number] Date of Birth: Jun 18, 1961  Transition of Care [facility] Xxx) CM/SW Contact:    Mccartney Chuba C Tarpley-Carter, LCSWA Phone Number: 08-12-2021, 12:40 PM   Clinical Narrative: Pt participated in [place].  Pt stated he does not use substance or ETOH.  Pt was not offered resources, due to no usage of substance or ETOH.     Zyere Jiminez Tarpley-Carter, MSW, LCSW-A Pronouns:  She/Her/Hers [facility] HealthTransitions of Care Clinical Social Worker Direct Number:  [phone] [email or web address]   CAGE-AID Screening:    Have You Ever Felt You Ought to Cut Down on Your Drinking or Drug Use?: No Have People Annoyed You By [employer] Your Drinking Or Drug Use?: No Have You Felt Bad Or Guilty About Your Drinking Or Drug Use?: No Have You Ever Had a Drink or Used Drugs First Thing In The Morning to Steady Your Nerves or to Get Rid of a Hangover?: No CAGE-AID Score: 0  Substance Abuse Education Offered: No

## 2021-08-13 NOTE — Progress Notes (Signed)
[place] [address] [facility]) Hospice hospital liaison SW note   [name] is a current hospice patient with a terminal diagnosis of small cell lung cancer. Patient was in usual state of health at home, got up to go to bathroom and tripped and fell landing on his left hip. He was transported to the hospital and [facility] was notified after he got there. He was admitted to [facility] on 10.21 with diagnosis of femur fracture. Per Dr. Tomasa Hosteller with [facility] this is a related hospice admission.   Visited with patient at the bedside. He states that he is doing well today with the exception of having pain. He rates his pain a 8/10, unit RN Carolynne Edouard present and states she just gave him fentanyl. He is anticipating surgery at 12:30 pm today.  Plan is to return home with hospice services still in place once he is medically ready to be discharged. [place] liaison will continue to follow for anticipated DME needs at discharge.   Patient remains GIP appropriate due to needing IV pain medications and surgical intervention for hip fracture.   IV/PRN Meds: fentaNYL (SUBLIMAZE) injection 25 mcg X 5 doses.   Vital signs:  Blood pressure 109/88, pulse (!) 102, temperature 98 F (36.7 C), resp. rate 18, SpO2 97 %.  I&O: not charted   Abnormal labs: Sodium: 131 (L) Potassium: 4.1 Chloride: 96 (L) Glucose: 300 (H) Mean Plasma Glucose: 277.61 BUN: 32 (H) Creatinine: 1.39 (H) Calcium: 8.5 (L) Albumin: 2.1 (L) AST: 14 (L) Total Protein: 5.7 (L) Total Bilirubin: 1.0 GFR, Estimated: 58 (L) Iron: 34 (L) UIBC: 173 TIBC: 207 (L) Saturation Ratios: 16 (L) Ferritin: 509 (H) Vitamin B12: 933 (H) WBC: 12.3 (H) RBC: 3.47 (L) Hemoglobin: 10.0 (L) HCT: 32.3 (L) RDW: 16.2 (H) Platelets: 163 nRBC: 0.4 (H) Lymphocyte #: 1.2 Monocyte #: 1.3 (H) Abs Immature Granulocytes: 0.11 (H) RBC.: 3.45 (L) Immature Retic Fract: 33.3 (H) Hemoglobin A1C: 11.3 (H)  Diagnostics: EXAM: CHEST - 2 VIEW   COMPARISON:   12/28/2020   FINDINGS: Cardiomegaly as seen previously. Aortic atherosclerosis. Pacemaker/AICD in place. Power port in place from a right-sided approach with tip in the SVC above the right atrium. Pulmonary edema seen previously is not present today. Small amount of fluid in the fissures. No infiltrate, collapse or measurable effusion.   IMPRESSION: Cardiomegaly and aortic atherosclerosis. Pacemaker/AICD. No pulmonary edema today. Small amount of fluid in the fissures.  Problem List: Fall with intertrochanteric left hip fracture:  -High risk for peri-op CV complications. Patient understands risks and is interested in proceeding with surgery -Planning for removal of hardware and cephalomedullary nailing of left hip today. -Try to avoid GA if possible and operate under a block with conscious sedation, if possible   2. HFrEF mixed ICM/NICM:  -Echo 12/2020 with EF of 15%, RV moderately reduced, severe MR.   -Echo 02/2021 EF < 20%, RV moderately reduced -GDMT limited in the setting of hypotension. On 10 mg midodrine TID.  -BNP 1645 on admission. Given 60 mg lasix IV 10/23. No Is/Os or weights charted, orders placed. Patient reports brisk diuresis, bag full before emptied this am. Has foley.  - Resume po furosemide post op. On 20 mg daily PTA -Place TED hose   3. S/p ICD:  -St Jude dual chamber. This had been deactivated as he is currently under hospice care.   4. Severe MR:  -Noted on echocardiogram 3/22.   -Felt to be poor candidate for intervention with history of lung cancer,  no plans for MitraClip   5. Metastatic lung cancer with brain mets: -Previously received radiation and chemotherapy.   -Had a chest CT 3/22 showing interval enlargement of spiculated nodule consistent with worsening primary malignancy and newly enlarged pretracheal and subcarinal lymph nodes. --Chemotherapy has been stopped, he is now followed by hospice --Palliative care consulted via primary   6. Left lower  extremity DVT:  -Previously been on Xarelto, and has been doing Lovenox shots PTA   7. [place] endocarditis: -Involving TV lead (felt to be thrombus), noted during admit 05/22 with PE.  -On lovenox PTA   8. CAD:  -Cath 12/2008 with mid LAD totally occluded, collateralized.   -Stress test 09/2018 with no ischemia, prior infarct.   -No anginal symptoms PTA  Discharge planning: Ongoing  Family Contact: Spoke with [name], brother. Questions regarding if any tests have been run in regards to his cancer and inquiring about how risky the surgery is. Liaison redirected brother to unit RN and surgeons. No other concerns at this time.   IDT; updated  Goals of care: return home after surgery  When patient is ready for discharge, please utilize [facility] for transportation as they contract this service for our current hospice patients.    Please don't hesitate to call with any hospice related questions or concerns  Thank you,   [name] [facility] Liaison  [phone]

## 2021-08-13 NOTE — Progress Notes (Addendum)
Advanced Heart Failure Rounding Note  PCP-Cardiologist: Glori Bickers, MD    Patient Profile   Dennis [name]. is a 60 y.o. male with a hx of diabetes, polysubstance abuse, mixed ICM/NICM, chronic systolic CHF due to ventricular noncompaction on chronic Coumadin, small cell lung cancer with brain mets, noncompliance, PE, embolic CVA, severe MR who is admitted after fall resulting in left hip fracture   Subjective:    Given lasix 60 mg IV 10/23. No weights or Is/Os charted. Reports brisk output.   Scr stable, 1.39 this am  No dyspnea or CP.     Objective:      There is no height or weight on file to calculate BMI.   Vital Signs:   Temp:  [97.6 F (36.4 C)-98 F (36.7 C)] 98 F (36.7 C) (10/24 0806) Pulse Rate:  [100-109] 100 (10/24 0806) Resp:  [18-20] 18 (10/24 0806) BP: (106-112)/(75-88) 109/88 (10/24 0806) SpO2:  [95 %-98 %] 97 % (10/24 0806) Last BM Date: 08/01/21  Weight change: There were no vitals filed for this visit.  Intake/Output:  No intake or output data in the 24 hours ending 08-11-2021 0856    Physical Exam    General:  Lying comfortably in bed. No distress. HEENT: normal Neck: supple. JVP 8 cm. Carotids 2+ bilat; no bruits. No lymphadenopathy or thryomegaly appreciated. Cor: PMI nondisplaced. Regular rate & rhythm. No rubs, gallops, 2/6 holosystolic murmur best heard at LLSB Lungs: clear Abdomen: soft, nontender, nondistended. No hepatosplenomegaly. No bruits or masses. Good bowel sounds. Extremities: no cyanosis, clubbing, rash, 1-2+ edema LLE, trace on right Neuro: alert & orientedx3, cranial nerves grossly intact. Decreased movement LLE. Affect pleasant    Telemetry   SR/ST 90s-100s, 6 beat run NSVT this am  Labs    CBC Recent Labs    08/04/21 0428 2021/08/11 0340  WBC 12.8* 12.3*  NEUTROABS 10.7* 9.6*  HGB 10.0* 10.0*  HCT 33.1* 32.3*  MCV 94.8 93.1  PLT 143* 017   Basic Metabolic Panel Recent Labs    08/04/21 0428  2021/08/11 0340  NA 132* 131*  K 4.5 4.1  CL 98 96*  CO2 24 26  GLUCOSE 375* 300*  BUN 26* 32*  CREATININE 1.46* 1.39*  CALCIUM 8.2* 8.5*  MG 2.2 1.9  PHOS 3.3 3.1   Liver Function Tests Recent Labs    08/04/21 0428 08/11/2021 0340  AST 13* 14*  ALT 10 12  ALKPHOS 103 86  BILITOT 2.1* 1.0  PROT 5.4* 5.7*  ALBUMIN 2.1* 2.1*   No results for input(s): LIPASE, AMYLASE in the last 72 hours. Cardiac Enzymes No results for input(s): CKTOTAL, CKMB, CKMBINDEX, TROPONINI in the last 72 hours.  BNP: BNP (last 3 results) Recent Labs    12/25/20 1726 02/22/21 1442 08/04/21 0428  BNP 1,073.9* 408.7* 1,645.9*    ProBNP (last 3 results) No results for input(s): PROBNP in the last 8760 hours.   D-Dimer No results for input(s): DDIMER in the last 72 hours. Hemoglobin A1C Recent Labs    11-Aug-2021 0340  HGBA1C 11.3*   Fasting Lipid Panel No results for input(s): CHOL, HDL, LDLCALC, TRIG, CHOLHDL, LDLDIRECT in the last 72 hours. Thyroid Function Tests No results for input(s): TSH, T4TOTAL, T3FREE, THYROIDAB in the last 72 hours.  Invalid input(s): FREET3  Other results:   Imaging    DG Chest 2 View  Result Date: 08/04/2021 CLINICAL DATA:  Congestive heart failure.  Diabetes. EXAM: CHEST - 2 VIEW COMPARISON:  12/28/2020  FINDINGS: Cardiomegaly as seen previously. Aortic atherosclerosis. Pacemaker/AICD in place. Power port in place from a right-sided approach with tip in the SVC above the right atrium. Pulmonary edema seen previously is not present today. Small amount of fluid in the fissures. No infiltrate, collapse or measurable effusion. IMPRESSION: Cardiomegaly and aortic atherosclerosis. Pacemaker/AICD. No pulmonary edema today. Small amount of fluid in the fissures. Electronically Signed   By: Nelson Chimes M.D.   On: 08/04/2021 12:46     Medications:     Scheduled Medications:  atorvastatin  20 mg Oral QPM   Chlorhexidine Gluconate Cloth  6 each Topical Daily    cholecalciferol  2,000 Units Oral BID AC & HS   dexamethasone  1 mg Oral Daily   enoxaparin (LOVENOX) injection  40 mg Subcutaneous Q24H   furosemide  20 mg Oral Daily   insulin aspart  0-9 Units Subcutaneous Q4H   insulin glargine-yfgn  15 Units Subcutaneous Daily   midodrine  10 mg Oral TID   mometasone-formoterol  2 puff Inhalation BID   sodium chloride flush  10-40 mL Intracatheter Q12H   tamsulosin  0.4 mg Oral Daily   umeclidinium bromide  1 puff Inhalation Daily    Infusions:   PRN Medications: fentaNYL (SUBLIMAZE) injection **OR** fentaNYL (SUBLIMAZE) injection, sodium chloride flush     Assessment/Plan   Fall with intertrochanteric left hip fracture:  -High risk for peri-op CV complications. Patient understands risks and is interested in proceeding with surgery -Planning for removal of hardware and cephalomedullary nailing of left hip today. -Try to avoid GA if possible and operate under a block with conscious sedation, if possible   2. HFrEF mixed ICM/NICM:  -Echo 12/2020 with EF of 15%, RV moderately reduced, severe MR.   -Echo 02/2021 EF < 20%, RV moderately reduced -GDMT limited in the setting of hypotension. On 10 mg midodrine TID.  -BNP 1645 on admission. Given 60 mg lasix IV 10/23. No Is/Os or weights charted, orders placed. Patient reports brisk diuresis, bag full before emptied this am. Has foley.  - Resume po furosemide post op. On 20 mg daily PTA -Place TED hose   3. S/p ICD:  -St Jude dual chamber. This had been deactivated as he is currently under hospice care.   4. Severe MR:  -Noted on echocardiogram 3/22.   -Felt to be poor candidate for intervention with history of lung cancer, no plans for MitraClip   5. Metastatic lung cancer with brain mets: -Previously received radiation and chemotherapy.   -Had a chest CT 3/22 showing interval enlargement of spiculated nodule consistent with worsening primary malignancy and newly enlarged pretracheal and  subcarinal lymph nodes. --Chemotherapy has been stopped, he is now followed by hospice --Palliative care consulted via primary   6. Left lower extremity DVT:  -Previously been on Xarelto, and has been doing Lovenox shots PTA  7. [place] endocarditis: -Involving TV lead (felt to be thrombus), noted during admit 05/22 with PE.  -On lovenox PTA   8. CAD:  -Cath 12/2008 with mid LAD totally occluded, collateralized.   -Stress test 09/2018 with no ischemia, prior infarct.   -No anginal symptoms PTA    Length of Stay: 2  FINCH, LINDSAY N, PA-C  23-Aug-2021, 8:56 AM  Advanced Heart Failure Team Pager [phone] (M-F; 7a - 5p)  Please contact [facility] for night-coverage after hours (5p -7a ) and weekends on [email or web address]    Patient seen and examined with the above-signed Advanced Practice Provider and/or Housestaff. I  personally reviewed laboratory data, imaging studies and relevant notes. I independently examined the patient and formulated the important aspects of the plan. I have edited the note to reflect any of my changes or salient points. I have personally discussed the plan with the patient and/or family.  Diuresed with IV lasix overnight. Denies orthopnea or PND. Still with left hip pain. Plan for OR today  General:  Sitting up in bed No resp difficulty HEENT: normal Neck: supple. JVP 8-9 Carotids 2+ bilat; no bruits. No lymphadenopathy or thryomegaly appreciated. Cor: PMI nondisplaced. Regular rate & rhythm. No rubs, gallops or murmurs. Lungs: clear Abdomen: soft, nontender, nondistended. No hepatosplenomegaly. No bruits or masses. Good bowel sounds. Extremities: no cyanosis, clubbing, rash, 1-2+ edema Neuro: alert & orientedx3, cranial nerves grossly intact. moves all 4 extremities w/o difficulty. Affect pleasant  Ok to proceed with OR. Agree with resuming po lasix. May need more IV diuresis post-op.   Glori Bickers, MD

## 2021-08-13 NOTE — Progress Notes (Signed)
Chaplain responded to page requesting support of pt's family at the time of his death.  Chaplain offered support, care and concern for pt's brother [name] while he waited for a friend to come pick him up.  He is going to tell his mother and will bring her back if she wants to come.  PACU staff have contact info to reach out to [place] if needed.  Chaplain available if mother does come and additional support is needed.  [place]

## 2021-08-13 NOTE — TOC CM/SW Note (Signed)
HF TOC CM following for dc needs. Pt is active with [facility]. Scheduled for surgery to left femur fracture. Waiting PT recommendations. [place], Heart Failure TOC CM [phone]

## 2021-08-13 NOTE — Progress Notes (Signed)
PT Cancellation Note  Patient Details Name: Dennis [name]. MRN: [id number] DOB: 1961-04-30   Cancelled Treatment:    Reason Eval/Treat Not Completed: Medical issues which prohibited therapy;Active bedrest order Plan for OR today per ortho. Will follow up post op and await increase in activity orders.   Marguarite Arbour A Frenchie Dangerfield 2021-08-10, 7:29 AM Marisa Severin, PT, DPT Acute Rehabilitation Services Pager [phone] Office [phone]

## 2021-08-13 NOTE — Plan of Care (Signed)

## 2021-08-13 NOTE — Death Summary Note (Signed)
DEATH SUMMARY   Patient Details  Name: Dennis [name]. MRN: [id number] DOB: 1961/04/16  Admission/Discharge Information   Admit Date:  08-20-21  Date of Death: Date of Death: 08-23-21  Time of Death: Time of Death: 01-08-20  Length of Stay: 2  Referring Physician: [name], MD   Reason(s) for Hospitalization  Fall with Hip Fracture  Diagnoses  Preliminary cause of death:   Acute cardiorespiratory failure in the setting of acute hip fracture surgery  Secondary Diagnoses (including complications and co-morbidities):  Principal Problem:   Closed fracture of distal end of left femur, unspecified fracture morphology, initial encounter [facility] Active Problems:   Chronic combined systolic and diastolic CHF (congestive heart failure) ([place])   Hyperlipidemia associated with type 2 diabetes mellitus ([place])   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 Doreatha Martin -ER provider discussed case with Dr. Ronnie Derby, on-call for orthopedic surgery who stated that they felt that the patient was not an appropriate candidate for surgical intervention but recommended that we reach out directly to Dr. Doreatha Martin to discuss further -ER provider has attempted to reach Dr. Doreatha Martin but since he is not on-call attempt has been unsuccessful -Dr. Ronnie Derby has made Dr. Doreatha Martin aware and Ortho evaluation done and they feel the patient would best be served by undergoing removal of his  previously nail and cephalomedullary nailing of his hip fracture and feel that he will be bedridden nonambulatory the remainder of his life without surgery.  -WBC went from 14.3 -> 14.2 -> 12.8 -> 12.3 -In the meantime, will provide patient with as needed analgesics for substantial associated pain -Continue with diet for now and make him n.p.o. in anticipation for possible surgical procedure in the morning -We will also add enoxaparin subcu for DVT prophylaxis but hold for now **Unfortunately during the patient's surgery he became hypotensive and had multiple cardiac arrhythmias.  Because of the DNR status he expired on the table despite the anesthesiologist attempt to correct the hypotension and cardiac rhythms with medications; patient was terminally extubated then   Extensive stage primary small cell carcinoma of lung ([place]) with metastasis to the brain -Advanced small cell lung cancer diagnosed January 08, 2020 -Known metastatic disease to the brain and he previously received radiation and chemotherapy -Patient is no longer receiving chemotherapy and is currently on hospice as he had a chest CT on 01/01/2021 which showed interval enlargement of a spiculated nodule consistent with worsening primary malignancy and newly enlarged paratracheal and subcarinal lymph nodes -Hospice is following and have added palliative care for further goals of care discussion -Has been following with Dr. Julien Nordmann as an outpatient the past and will notify him of the patient's hospitalization as a courtesy via epic -Patient was a hospice patient    Goals of care, counseling/discussion -Patient suffers from multiple advanced medical problems including advanced cardiomyopathy and metastatic small cell lung cancer -Overall prognosis is extremely poor -Patient is currently receiving hospice services with [facility] -It has been confirmed that patient remains DNR -That being said, patient still wishes  to pursue some sort of curative  intervention for his left hip fracture as patient was ambulating with a cane or walker just prior to the fall; palliative care has been consulted for further goals of care discussion but signed off as hospice has been following    Chronic combined systolic and diastolic CHF (congestive heart failure) with EF of 15% (HCC) CAD Ischemic cardiomyopathy and CAD with a mid LAD that is totally occluded and collateralized Status post AICD placement; this has been deactivated as he is currently under hospice -No evidence of cardiogenic volume overload at this time. -BNP was elevated at 1645.9 today and Cardiology gave him IV Lasix 60 mg x1  -Patient had a cardiac cath in 12/2008 and a stress test 10/24/2017 with no ischemia noted -Strict I's and O's and Daily Weights: Weights not done accurately -The patient denies any cardiac symptoms and denies any chest pain -We will consult cardiology for further evaluation and for preoperative clearance; continue with Lasix 20 g p.o. daily and he was given IV Lasix as above and Cardiology recommends resuming po Lasix Post-Op -GDMT is very limited in the setting of his hypotension -Cardiology recommends chest x-ray and this is in the process of being done -Cardiology felt that he was a high risk for general anesthesia given his EF of 15% -Resumed the patient's Atorvastatin 20 mg po qHS -Patient subsequently expired on the table during surgery   Severe Mitral Regurgitation -Neurology been consulted and this was noted to be on his echocardiogram on 01/01/2021.   -He is felt to be a poor candidate with his history of lung cancer and there is no plans for MitraClip per cardiology   Hypomagnesemia -Mag Level was 1.6 -> 2.2 -> 1.9 -Replete with IV Mag Sulfate 2 grams yesterday -Continue to Monitor and Replete as Necessary -Repeat Mag Level in the AM    Normocytic Anemia -Patient's Hgb/Hct went from 12.1/39.5 -> 11.7/39.0 -> 10.0/33.1 -> 10.0//32.3 -Checked Anemia  Panel and showed iron level of 34, U IBC 173, TIBC 207, saturation was 16%, ferritin level 509, folate level 12.3, vitamin B12 933 -Continue to Monitor for S/Sx of Bleeding; No overt bleeding noted -Was going to repeat CBC in the AM the patient passed away during surgery   CKD Stage 3a -Patient's BUN/Cr went from 19/1.62 -> 23/1.50 -> 26/1.46 -> 32/1.39 -Avoid Nephrotoxic Medications, Contrast Dyes, Hypotension and Renally Adjust Medications -C/w Lasix 20 mg po Daily  -Going to repeat CMP in the AM the patient passed away during the surgery   Hyponatremia -Mild. Na+ went from 136 -> 137 -> 132 -> 131 -C/w po Lasix after surgery and given IV Lasix as above -Continue to Monitor and Trend -Was going to repeat CMP in the AM the patient passed away during the surgery   Hyperbilirubinemia -Patient's T Bili went from 2.3 -> 2.5 -> 2.1 -> 1.0 -Likely Reactive -Continue to Monitor and Replete as Necessary -Repeat CMP in the AM   Type 2 Diabetes Mellitus with Hyperglycemia, with long-term current use of insulin ([place]) -Patient been placed on Accu-Cheks before every meal and nightly with sliding scale insulin that is a moderate NovoLog intensity but changed to Sensitive Novolog SSI q4h -Continuing home regimen of basal insulin therapy reduce the dose from 25 units SQ twice daily to 15 units SQ twice daily -Hemoglobin A1C was 11.3; Last HbA1c was 8.7 -Heart Healthy Diabetic Diet -CBGs ranging from 198-473    History of Pulmonary Embolism -Bilateral pulmonary emboli diagnosed in  02/2021 -Of note, patient additionally has a remote history of DVT -Patient was on subcutaneous Lovenox after his hospitalization in May however this has since been discontinued since the patient has been entered into hospice care. -Patient currently not exhibiting any shortness of breath or oxygen requirement   Hyperlipidemia associated with type 2 diabetes mellitus ([place]) -Continuing home regimen of lipid lowering  therapy with atorvastatin 20 mg p.o. nightly   Benign Prostatic Hyperplasia -Continue home regimen of tamsulosin 0.4 mg p.o. daily  Hypoalbuminemia -Patient's Albumin was 2.1  Brief Hospital Course (including significant findings, care, treatment, and services provided and events leading to death)  The patient was a 60 year old chronically ill-appearing African-American male with a past medical history significant for but not limited to small cell lung cancer with metastasis to brain as diagnosed back in 2021 in March status post whole brain radiation chemotherapy which has been since discontinued, chronic systolic and diastolic CHF with an EF of less than 20% status post AICD, CAD, history of cocaine abuse with his last year in 2017, history of right MCA stroke, hypertension, hyperlipidemia, insulin-dependent diabetes mellitus type 2, bilateral pulm embolus which was previously on Lovenox and now discontinued on the BPH who is enrolled in hospice and presents to [facility] emergency room after a fall with complaints of left thigh and hip pain.  Patient stated that approximately around 5 AM he was in his normal state of health when he got up the bed to the kitchen to get a glass of water.  He typically uses a walker to ambulate unfortunately while having aggressive water in his hand he dropped a and fell on the water I was on the floor landing on his left hip immediately.  Patient developed severe left hip pain and described it as sharp in quality and worse with movement without any radiation.  He denies any lightheadedness or dizziness or loss of consciousness prior to his fall.  Because of his severe pain that persisted throughout the day and inability to ambulate the patient's brother brought patient's mother to [facility] for further evaluation.  In the ED he underwent imaging of the left femur which revealed an intertrochanteric left femur fracture just above the femoral intramedullary nail.  The ED  provider discussed with Dr. Ronnie Derby who felt that the patient was not an appropriate surgical candidate at this time but recommended being stabilized and having orthopedic trauma surgery Dr. Doreatha Martin evaluate for possible surgical intervention on Monday.   Cardiology was been consulted for preoperative evaluation. Patient is a High risk for general Anesthesia and cardiology is recommending continuing Lasix 20 mg p.o. daily.  We will be obtaining a chest x-ray. Trauma Ortho evaluated and they believe that the patient would best be served by undergoing removal of his previously nail and cephalomedullary nailing of his hip fracture and feel that he will be bedridden nonambulatory the remainder of his life without surgery.   Patient underwent surgical intervention on 08/06/2019 2 in the afternoon and 4 cephalomedullary nailing of the intertrochanteric femur fracture and removal of the hardware of the left femur.  Subsequently and unfortunately during the procedure he became hypotensive and due to his DNR status he expired on the table and was terminally extubated after multiple attempts by anesthesia to correct his significant hypotension and cardiac rhythms with medications.  He was pronounced by Dr. Doreatha Martin and the OR staff at 1821 and his brother was allowed to come see the patient.  Patient's brother picked of the  patient's personal belongings.   Pertinent Labs and Studies  Significant Diagnostic Studies DG Chest 2 View  Result Date: 08/04/2021 CLINICAL DATA:  Congestive heart failure.  Diabetes. EXAM: CHEST - 2 VIEW COMPARISON:  12/28/2020 FINDINGS: Cardiomegaly as seen previously. Aortic atherosclerosis. Pacemaker/AICD in place. Power port in place from a right-sided approach with tip in the SVC above the right atrium. Pulmonary edema seen previously is not present today. Small amount of fluid in the fissures. No infiltrate, collapse or measurable effusion. IMPRESSION: Cardiomegaly and aortic  atherosclerosis. Pacemaker/AICD. No pulmonary edema today. Small amount of fluid in the fissures. Electronically Signed   By: Nelson Chimes M.D.   On: 08/04/2021 12:46   DG C-Arm 1-60 Min-No Report  Result Date: 08/06/21 Fluoroscopy was utilized by the requesting physician.  No radiographic interpretation.   DG C-Arm 1-60 Min-No Report  Result Date: August 06, 2021 Fluoroscopy was utilized by the requesting physician.  No radiographic interpretation.   DG FEMUR MIN 2 VIEWS LEFT  Result Date: 2021/08/06 CLINICAL DATA:  Intramedullary nail fixation. EXAM: LEFT FEMUR 2 VIEWS COMPARISON:  None. FINDINGS: Eight intraoperative views of the LEFT femur. Removal of intramedullary nail from inferior approach. Placement of a intramedullary nail from a superior approach. Dynamic screws span the femoral neck. Distal interlocking screws. IMPRESSION: Intramedullary nail fixation of LEFT intertrochanteric femur fracture. Electronically Signed   By: Suzy Bouchard M.D.   On: 08-06-21 21:39   DG Femur Min 2 Views Left  Result Date: 07/15/2021 CLINICAL DATA:  Patient tripped and fell this morning. Left thigh injury with inability to bear weight. EXAM: LEFT FEMUR 2 VIEWS COMPARISON:  Radiographs 12/26/2020.  Pelvic CT 02/21/2021. FINDINGS: Status post left femoral intramedullary nail fixation with intact hardware and no screw loosening. The previously demonstrated distal diaphyseal femur fracture has healed with moderate posttraumatic deformity. There is a new acute, mildly comminuted intertrochanteric femur fracture. The femoral head is intact and located. The visualized left lower hemipelvis appears intact. IMPRESSION: 1. Comminuted intertrochanteric left femur fracture just above the femoral intramedullary nail. 2. Interval healing of distal diaphyseal femur fracture. Electronically Signed   By: Richardean Sale M.D.   On: 07/13/2021 17:48    Microbiology Recent Results (from the past 240 hour(s))  Resp Panel  by RT-PCR (Flu A&B, Covid) Nasopharyngeal Swab     Status: None   Collection Time: 08/09/2021  4:56 PM   Specimen: Nasopharyngeal Swab; Nasopharyngeal(NP) swabs in vial transport medium  Result Value Ref Range Status   SARS Coronavirus 2 by RT PCR NEGATIVE NEGATIVE Final    Comment: (NOTE) SARS-CoV-2 target nucleic acids are NOT DETECTED.  The SARS-CoV-2 RNA is generally detectable in upper respiratory specimens during the acute phase of infection. The lowest concentration of SARS-CoV-2 viral copies this assay can detect is 138 copies/mL. A negative result does not preclude SARS-Cov-2 infection and should not be used as the sole basis for treatment or other patient management decisions. A negative result may occur with  improper specimen collection/handling, submission of specimen other than nasopharyngeal swab, presence of viral mutation(s) within the areas targeted by this assay, and inadequate number of viral copies(<138 copies/mL). A negative result must be combined with clinical observations, patient history, and epidemiological information. The expected result is Negative.  Fact Sheet for Patients:  EntrepreneurPulse.com.au  Fact Sheet for Healthcare Providers:  IncredibleEmployment.be  This test is no t yet approved or cleared by the [place] FDA and  has been authorized for detection and/or diagnosis of SARS-CoV-2 by FDA  under an Emergency Use Authorization (EUA). This EUA will remain  in effect (meaning this test can be used) for the duration of the COVID-19 declaration under Section 564(b)(1) of the Act, 21 U.S.C.section 360bbb-3(b)(1), unless the authorization is terminated  or revoked sooner.       Influenza A by PCR NEGATIVE NEGATIVE Final   Influenza B by PCR NEGATIVE NEGATIVE Final    Comment: (NOTE) The Xpert Xpress SARS-CoV-2/FLU/RSV plus assay is intended as an aid in the diagnosis of influenza from Nasopharyngeal swab  specimens and should not be used as a sole basis for treatment. Nasal washings and aspirates are unacceptable for Xpert Xpress SARS-CoV-2/FLU/RSV testing.  Fact Sheet for Patients: EntrepreneurPulse.com.au  Fact Sheet for Healthcare Providers: IncredibleEmployment.be  This test is not yet approved or cleared by the [place] FDA and has been authorized for detection and/or diagnosis of SARS-CoV-2 by FDA under an Emergency Use Authorization (EUA). This EUA will remain in effect (meaning this test can be used) for the duration of the COVID-19 declaration under Section 564(b)(1) of the Act, 21 U.S.C. section 360bbb-3(b)(1), unless the authorization is terminated or revoked.  Performed at [facility], [place] [address], [place], [place] [address]   Surgical pcr screen     Status: None   Collection Time: 2021-08-26  8:55 AM   Specimen: Nasal Mucosa; Nasal Swab  Result Value Ref Range Status   MRSA, PCR NEGATIVE NEGATIVE Final   Staphylococcus aureus NEGATIVE NEGATIVE Final    Comment: (NOTE) The Xpert SA Assay (FDA approved for NASAL specimens in patients 68 years of age and older), is one component of a comprehensive surveillance program. It is not intended to diagnose infection nor to guide or monitor treatment. Performed at [facility], [place] [address], [place], [place] [address]    Lab Basic Metabolic Panel: Recent Labs  Lab 08/04/2021 1653 08/03/21 1000 08/04/21 0428 08/26/2021 0340 08-26-2021 1745  NA 136 137 132* 131* 136  K 4.7 4.4 4.5 4.1 4.4  CL 103 103 98 96*  --   CO2 22 23 24 26   --   GLUCOSE 262* 261* 375* 300*  --   BUN 19 23* 26* 32*  --   CREATININE 1.62* 1.50* 1.46* 1.39*  --   CALCIUM 8.7* 8.6* 8.2* 8.5*  --   MG  --  1.6* 2.2 1.9  --   PHOS  --  3.5 3.3 3.1  --    Liver Function Tests: Recent Labs  Lab 08/02/2021 1653 08/03/21 1000 08/04/21 0428 26-Aug-2021 0340  AST 17 15 13* 14*  ALT 14 14 10 12    ALKPHOS 118 107 103 86  BILITOT 2.3* 2.5* 2.1* 1.0  PROT 6.0* 6.0* 5.4* 5.7*  ALBUMIN 2.7* 2.5* 2.1* 2.1*   No results for input(s): LIPASE, AMYLASE in the last 168 hours. No results for input(s): AMMONIA in the last 168 hours. CBC: Recent Labs  Lab 07/28/2021 1653 08/03/21 1000 08/04/21 0428 08-26-2021 0340 08-26-2021 1745  WBC 14.3* 14.2* 12.8* 12.3*  --   NEUTROABS 11.8* 11.5* 10.7* 9.6*  --   HGB 12.1* 11.7* 10.0* 10.0* 9.9*  HCT 39.5 39.0 33.1* 32.3* 29.0*  MCV 95.0 96.5 94.8 93.1  --   PLT 134* 128* 143* 163  --    Cardiac Enzymes: No results for input(s): CKTOTAL, CKMB, CKMBINDEX, TROPONINI in the last 168 hours. Sepsis Labs: Recent Labs  Lab 07/27/2021 1653 08/03/21 1000 08/04/21 0428 08-26-2021 0340  WBC 14.3* 14.2* 12.8*  12.3*   Procedures/Operations  Procedures: CPT 27245-Cephalomedullary nailing of left intertrochanteric femur fracture CPT 20680-Removal of hardware left femur    Raiford Noble, DO [facility] 08/06/2021, 5:44 PM

## 2021-08-13 DEATH — deceased

## 2021-11-04 ENCOUNTER — Telehealth: Payer: Self-pay | Admitting: *Deleted

## 2021-11-04 NOTE — Telephone Encounter (Signed)
Confirmed patient date of birth as 1960/11/09 upon receipt of E-mail to [email or web address]. "Good morning, I have attached a document from my fathers life insurance policy that I would like Dr. Julien Nordmann to fill out at his earliest convenience. This document is just stating how my dads health was before his passing.  There are several pages in the attached document, please only fill out the third page that says "Statement of physician."  The document can be emailed back to me at [email or web address]   Thank you, and have a great week.   Currently awaiting provider review for signature.

## 2021-11-13 NOTE — Telephone Encounter (Signed)
Successfully connected with "uncle Mikkel Charrette" with call to patient's home [phone] (home) in attempt to try to reach patient's daughter Rivaan Kendall regarding her receipt of [employer] [place] of Death - Claimant's statement returned as received on 11/08/2021 to [email or web address] as all previous communication solely through e-mail.    Imagene Gurney advised this nurse to mail form to [place] [phone] Addressed to Darlyne Russian.    Expressed sincere condolences for their loss.  Currently no further questions or needs.

## 2023-09-02 NOTE — Telephone Encounter (Signed)
Telephone call
